# Patient Record
Sex: Female | Born: 1945 | Race: White | Hispanic: No | Smoking: Never smoker
Health system: Southern US, Community
[De-identification: ages and names within clinical notes are randomized; demographics above are authoritative.]

## PROBLEM LIST (undated history)

## (undated) DIAGNOSIS — K760 Fatty (change of) liver, not elsewhere classified: Secondary | ICD-10-CM

## (undated) DIAGNOSIS — M1990 Unspecified osteoarthritis, unspecified site: Secondary | ICD-10-CM

## (undated) DIAGNOSIS — G971 Other reaction to spinal and lumbar puncture: Secondary | ICD-10-CM

## (undated) DIAGNOSIS — E785 Hyperlipidemia, unspecified: Secondary | ICD-10-CM

## (undated) DIAGNOSIS — I10 Essential (primary) hypertension: Secondary | ICD-10-CM

## (undated) DIAGNOSIS — H3530 Unspecified macular degeneration: Secondary | ICD-10-CM

## (undated) DIAGNOSIS — G4730 Sleep apnea, unspecified: Secondary | ICD-10-CM

## (undated) DIAGNOSIS — E669 Obesity, unspecified: Secondary | ICD-10-CM

## (undated) DIAGNOSIS — R748 Abnormal levels of other serum enzymes: Secondary | ICD-10-CM

## (undated) DIAGNOSIS — E119 Type 2 diabetes mellitus without complications: Secondary | ICD-10-CM

## (undated) HISTORY — DX: Fatty (change of) liver, not elsewhere classified: K76.0

## (undated) HISTORY — PX: DIAGNOSTIC LAPAROSCOPY: SUR761

## (undated) HISTORY — PX: TUBAL LIGATION: SHX77

## (undated) HISTORY — DX: Abnormal levels of other serum enzymes: R74.8

## (undated) HISTORY — DX: Hyperlipidemia, unspecified: E78.5

## (undated) HISTORY — PX: APPENDECTOMY: SHX54

## (undated) HISTORY — DX: Sleep apnea, unspecified: G47.30

## (undated) HISTORY — DX: Unspecified osteoarthritis, unspecified site: M19.90

## (undated) HISTORY — PX: SPINE SURGERY: SHX786

## (undated) HISTORY — DX: Essential (primary) hypertension: I10

## (undated) HISTORY — PX: ABDOMINAL HYSTERECTOMY: SHX81

## (undated) HISTORY — DX: Type 2 diabetes mellitus without complications: E11.9

## (undated) HISTORY — PX: CATARACT EXTRACTION: SUR2

## (undated) HISTORY — DX: Obesity, unspecified: E66.9

---

## 1985-07-21 HISTORY — PX: CERVICAL LAMINECTOMY: SHX94

## 2007-05-24 ENCOUNTER — Ambulatory Visit: Payer: Self-pay | Admitting: Family Medicine

## 2007-06-02 ENCOUNTER — Ambulatory Visit: Payer: Self-pay | Admitting: Family Medicine

## 2007-10-31 ENCOUNTER — Ambulatory Visit: Payer: Self-pay | Admitting: Family Medicine

## 2008-04-18 ENCOUNTER — Ambulatory Visit: Payer: Self-pay | Admitting: Family Medicine

## 2009-07-10 ENCOUNTER — Ambulatory Visit: Payer: Self-pay | Admitting: Family Medicine

## 2009-07-17 ENCOUNTER — Ambulatory Visit: Payer: Self-pay | Admitting: Family Medicine

## 2010-01-07 ENCOUNTER — Ambulatory Visit: Payer: Self-pay | Admitting: Gastroenterology

## 2010-03-17 ENCOUNTER — Ambulatory Visit: Payer: Self-pay | Admitting: Internal Medicine

## 2010-04-22 ENCOUNTER — Ambulatory Visit: Payer: Self-pay | Admitting: Otolaryngology

## 2012-05-12 ENCOUNTER — Ambulatory Visit: Payer: Self-pay | Admitting: Ophthalmology

## 2012-05-28 ENCOUNTER — Emergency Department: Payer: Self-pay | Admitting: *Deleted

## 2012-07-21 HISTORY — PX: COLONOSCOPY: SHX174

## 2013-04-18 ENCOUNTER — Ambulatory Visit: Payer: Self-pay | Admitting: Family Medicine

## 2013-04-25 ENCOUNTER — Ambulatory Visit: Payer: Self-pay | Admitting: Family Medicine

## 2013-05-06 ENCOUNTER — Ambulatory Visit: Payer: Self-pay | Admitting: Gastroenterology

## 2013-05-23 ENCOUNTER — Ambulatory Visit: Payer: Self-pay | Admitting: Family Medicine

## 2013-05-27 ENCOUNTER — Ambulatory Visit: Payer: Self-pay | Admitting: Gastroenterology

## 2013-06-20 ENCOUNTER — Ambulatory Visit: Payer: Self-pay | Admitting: Family Medicine

## 2013-07-21 ENCOUNTER — Ambulatory Visit: Payer: Self-pay | Admitting: Family Medicine

## 2013-08-02 ENCOUNTER — Ambulatory Visit: Payer: Self-pay | Admitting: Family Medicine

## 2013-08-02 LAB — RAPID STREP-A WITH REFLX: MICRO TEXT REPORT: NEGATIVE

## 2013-08-05 LAB — BETA STREP CULTURE(ARMC)

## 2014-06-25 ENCOUNTER — Emergency Department: Payer: Self-pay | Admitting: Emergency Medicine

## 2015-02-15 DIAGNOSIS — E1149 Type 2 diabetes mellitus with other diabetic neurological complication: Secondary | ICD-10-CM | POA: Insufficient documentation

## 2015-02-15 DIAGNOSIS — E785 Hyperlipidemia, unspecified: Secondary | ICD-10-CM | POA: Insufficient documentation

## 2015-02-26 ENCOUNTER — Ambulatory Visit (INDEPENDENT_AMBULATORY_CARE_PROVIDER_SITE_OTHER): Payer: Medicare Other | Admitting: Family Medicine

## 2015-02-26 ENCOUNTER — Encounter: Payer: Self-pay | Admitting: Family Medicine

## 2015-02-26 ENCOUNTER — Other Ambulatory Visit: Payer: Self-pay | Admitting: Family Medicine

## 2015-02-26 VITALS — BP 123/78 | HR 74 | Temp 98.6°F | Ht 63.3 in | Wt 208.0 lb

## 2015-02-26 DIAGNOSIS — E119 Type 2 diabetes mellitus without complications: Secondary | ICD-10-CM

## 2015-02-26 DIAGNOSIS — R079 Chest pain, unspecified: Secondary | ICD-10-CM | POA: Diagnosis not present

## 2015-02-26 DIAGNOSIS — Z1239 Encounter for other screening for malignant neoplasm of breast: Secondary | ICD-10-CM

## 2015-02-26 DIAGNOSIS — E785 Hyperlipidemia, unspecified: Secondary | ICD-10-CM | POA: Diagnosis not present

## 2015-02-26 LAB — BAYER DCA HB A1C WAIVED: HB A1C (BAYER DCA - WAIVED): 7.3 % — ABNORMAL HIGH (ref <7.0)

## 2015-02-26 NOTE — Assessment & Plan Note (Signed)
Discussed not at goal for diabetes Patient will do better with medication compliance and weight loss for gaining control.

## 2015-02-26 NOTE — Progress Notes (Signed)
   BP 123/78 mmHg  Pulse 74  Temp(Src) 98.6 F (37 C)  Ht 5' 3.3" (1.608 m)  Wt 208 lb (94.348 kg)  BMI 36.49 kg/m2  SpO2 98%   Subjective:    Patient ID: Alyssa Mayo, female    DOB: Dec 31, 1945, 69 y.o.   MRN: [id number]  HPI: Alyssa Mayo is a 69 y.o. female  Chief Complaint  Patient presents with  . Diabetes   Patient for follow-up diabetes has been doing well blood sugars all in all K takes her medication most every day though occasionally will miss Invokanna and morning metformin Patient states about concerning with weight loss whenever she carries her 35 pound grandson when sleeping from the car to the house gets completely winded with some occasional right chest area pain. No nausea and no diaphoresis Resolves after a few minutes but comes on consistently with heavy exertion.  Relevant past medical, surgical, family and social history reviewed and updated as indicated. Interim medical history since our last visit reviewed. Allergies and medications reviewed and updated.  Review of Systems  Constitutional: Negative.   Respiratory: Positive for shortness of breath.   Cardiovascular: Positive for chest pain. Negative for palpitations and leg swelling.  Gastrointestinal: Negative for abdominal distention.    Per HPI unless specifically indicated above     Objective:    BP 123/78 mmHg  Pulse 74  Temp(Src) 98.6 F (37 C)  Ht 5' 3.3" (1.608 m)  Wt 208 lb (94.348 kg)  BMI 36.49 kg/m2  SpO2 98%  Wt Readings from Last 3 Encounters:  02/26/15 208 lb (94.348 kg)  11/20/14 206 lb (93.441 kg)    Physical Exam  Constitutional: She appears well-developed and well-nourished.  Cardiovascular: Normal rate and regular rhythm.   Pulmonary/Chest: Effort normal and breath sounds normal.    Results for orders placed or performed in visit on 02/26/15  Bayer DCA Hb A1c Waived  Result Value Ref Range   Bayer DCA Hb A1c Waived 7.3 (H) <7.0 %      Assessment & Plan:    Problem List Items Addressed This Visit      Endocrine   Diabetes mellitus without complication - Primary    Discussed not at goal for diabetes Patient will do better with medication compliance and weight loss for gaining control.      Relevant Orders   Bayer DCA Hb A1c Waived (Completed)   Ambulatory referral to Cardiology   Bayer [facility] Hb A1c Waived   Basic metabolic panel     Other   Hyperlipidemia    The current medical regimen is effective;  continue present plan and medications.       Relevant Orders   Ambulatory referral to Cardiology   [facility], [place]   Basic metabolic panel    Other Visit Diagnoses    Screening for breast cancer        Relevant Orders    MM DIGITAL SCREENING BILATERAL    Chest pain, unspecified chest pain type        Because of patient's symptoms and risk factors will refer to cardiology to further evaluate    Relevant Orders    EKG 12-Lead (Completed)    Ambulatory referral to Cardiology        Follow up plan: Return in about 3 months (around 05/29/2015), or if symptoms worsen or fail to improve.

## 2015-02-26 NOTE — Assessment & Plan Note (Signed)
The current medical regimen is effective;  continue present plan and medications.  

## 2015-03-29 ENCOUNTER — Telehealth: Payer: Self-pay

## 2015-03-29 NOTE — Telephone Encounter (Signed)
Refill request for Celecoxib 200mg  1 capsule daily  [facility]

## 2015-03-30 MED ORDER — CELECOXIB 200 MG PO CAPS: 200.0000 mg | ORAL_CAPSULE | Freq: Every day | ORAL | Status: DC

## 2015-05-17 ENCOUNTER — Encounter: Payer: Self-pay | Admitting: Family Medicine

## 2015-05-17 ENCOUNTER — Ambulatory Visit (INDEPENDENT_AMBULATORY_CARE_PROVIDER_SITE_OTHER): Payer: Medicare Other | Admitting: Family Medicine

## 2015-05-17 VITALS — BP 138/66 | HR 94 | Temp 98.5°F | Ht 63.1 in | Wt 211.2 lb

## 2015-05-17 DIAGNOSIS — J329 Chronic sinusitis, unspecified: Secondary | ICD-10-CM | POA: Insufficient documentation

## 2015-05-17 DIAGNOSIS — E119 Type 2 diabetes mellitus without complications: Secondary | ICD-10-CM | POA: Diagnosis not present

## 2015-05-17 DIAGNOSIS — J0100 Acute maxillary sinusitis, unspecified: Secondary | ICD-10-CM | POA: Diagnosis not present

## 2015-05-17 MED ORDER — AMOXICILLIN 875 MG PO TABS: 875.0000 mg | ORAL_TABLET | Freq: Two times a day (BID) | ORAL | Status: DC

## 2015-05-17 NOTE — Progress Notes (Signed)
BP 138/66 mmHg  Pulse 94  Temp(Src) 98.5 F (36.9 C)  Ht 5' 3.1" (1.603 m)  Wt 211 lb 3.2 oz (95.8 kg)  BMI 37.28 kg/m2  SpO2 95%   Subjective:    Patient ID: Alyssa Mayo, female    DOB: [birth year], 69 y.o.   MRN: [id number]  HPI: Alyssa Mayo is a 69 y.o. female  Chief Complaint  Patient presents with  . URI   symptoms been ongoing for more than a week with a lot of head pressure especially with bending runny nose congestion and cough later this week is gotten more into her chest with more coughing. Has been running some low-grade fever and generalized achiness No nausea vomiting Been taking Alka-Seltzer plus with some relief.  Relevant past medical, surgical, family and social history reviewed and updated as indicated. Interim medical history since our last visit reviewed. Allergies and medications reviewed and updated.  Review of Systems  Constitutional: Positive for fever, chills and fatigue.  HENT: Positive for congestion, ear pain, postnasal drip, rhinorrhea, sinus pressure, sneezing and sore throat.   Eyes: Negative.   Respiratory: Positive for cough.   Cardiovascular: Negative.   Gastrointestinal: Negative.     Per HPI unless specifically indicated above     Objective:    BP 138/66 mmHg  Pulse 94  Temp(Src) 98.5 F (36.9 C)  Ht 5' 3.1" (1.603 m)  Wt 211 lb 3.2 oz (95.8 kg)  BMI 37.28 kg/m2  SpO2 95%  Wt Readings from Last 3 Encounters:  05/17/15 211 lb 3.2 oz (95.8 kg)  02/26/15 208 lb (94.348 kg)  11/20/14 206 lb (93.441 kg)    Physical Exam  Constitutional: She is oriented to person, place, and time. She appears well-developed and well-nourished. No distress.  HENT:  Head: Normocephalic and atraumatic.  Right Ear: Hearing and external ear normal.  Left Ear: Hearing and external ear normal.  Nose: Nose normal.  Eyes: Conjunctivae and lids are normal. Right eye exhibits no discharge. Left eye exhibits no discharge. No scleral icterus.  Neck:  Normal range of motion.  Cardiovascular: Normal rate, regular rhythm and normal heart sounds.   Pulmonary/Chest: Effort normal. No respiratory distress.  rhonchi  Musculoskeletal: Normal range of motion.  Lymphadenopathy:    She has no cervical adenopathy.  Neurological: She is alert and oriented to person, place, and time.  Skin: Skin is intact. No rash noted.  Psychiatric: She has a normal mood and affect. Her speech is normal and behavior is normal. Judgment and thought content normal. Cognition and memory are normal.    Results for orders placed or performed in visit on 02/26/15  Bayer DCA Hb A1c Waived  Result Value Ref Range   Bayer DCA Hb A1c Waived 7.3 (H) <7.0 %      Assessment & Plan:   Problem List Items Addressed This Visit      Respiratory   Sinusitis - Primary    Discuss use of over-the-counter medications continuing Alka-Seltzer plus and or Tylenol Will start amoxicillin Discuss over self-care techniques fluids etc.      Relevant Medications   amoxicillin (AMOXIL) 875 MG tablet     Endocrine   Diabetes mellitus without complication (HCC)    Discuss elevated glucose with being sick patient will take extra precautions with diet is unable to do exercise because of feeling bad.          Follow up plan: Return if symptoms worsen or fail to improve, for  As scheduled.

## 2015-05-17 NOTE — Assessment & Plan Note (Signed)
Discuss elevated glucose with being sick patient will take extra precautions with diet is unable to do exercise because of feeling bad.

## 2015-05-17 NOTE — Assessment & Plan Note (Signed)
Discuss use of over-the-counter medications continuing Alka-Seltzer plus and or Tylenol Will start amoxicillin Discuss over self-care techniques fluids etc.

## 2015-06-04 ENCOUNTER — Encounter: Payer: Self-pay | Admitting: Family Medicine

## 2015-06-04 ENCOUNTER — Ambulatory Visit (INDEPENDENT_AMBULATORY_CARE_PROVIDER_SITE_OTHER): Payer: Medicare Other | Admitting: Family Medicine

## 2015-06-04 VITALS — BP 136/80 | HR 91 | Temp 98.2°F | Ht 62.5 in | Wt 209.0 lb

## 2015-06-04 DIAGNOSIS — E785 Hyperlipidemia, unspecified: Secondary | ICD-10-CM

## 2015-06-04 DIAGNOSIS — Z23 Encounter for immunization: Secondary | ICD-10-CM

## 2015-06-04 DIAGNOSIS — E119 Type 2 diabetes mellitus without complications: Secondary | ICD-10-CM

## 2015-06-04 LAB — LP+ALT+AST+GLU PICCOLO, WAIVED
ALT (SGPT) Piccolo, Waived: 30 U/L (ref 10–47)
AST (SGOT) Piccolo, Waived: 33 U/L (ref 11–38)
CHOL/HDL RATIO PICCOLO,WAIVE: 3.4 mg/dL
Cholesterol Piccolo, Waived: 155 mg/dL (ref <200)
Glucose Piccolo, Waived: 197 mg/dL — ABNORMAL HIGH (ref 73–118)
HDL Chol Piccolo, Waived: 46 mg/dL — ABNORMAL LOW (ref >59)
LDL CHOL CALC PICCOLO WAIVED: 42 mg/dL (ref <100)
TRIGLYCERIDES PICCOLO,WAIVED: 332 mg/dL — AB (ref <150)
VLDL Chol Calc Piccolo,Waive: 66 mg/dL — ABNORMAL HIGH (ref <30)

## 2015-06-04 LAB — BAYER DCA HB A1C WAIVED: HB A1C: 7.5 % — AB (ref <7.0)

## 2015-06-04 LAB — MICROALBUMIN, URINE WAIVED
Creatinine, Urine Waived: 100 mg/dL (ref 10–300)
Microalb, Ur Waived: 30 mg/L — ABNORMAL HIGH (ref 0–19)
Microalb/Creat Ratio: <30 mg/g (ref <30)

## 2015-06-04 MED ORDER — BENAZEPRIL HCL 20 MG PO TABS: 20.0000 mg | ORAL_TABLET | Freq: Every day | ORAL | Status: DC

## 2015-06-04 NOTE — Assessment & Plan Note (Signed)
The current medical regimen is effective;  continue present plan and medications.  

## 2015-06-04 NOTE — Assessment & Plan Note (Addendum)
The current medical regimen is effective;  continue present plan and medications. With diabetes and renal protection borderline high blood pressure at times will start benazepril 20 mg 1 a day

## 2015-06-04 NOTE — Progress Notes (Signed)
BP 136/80 mmHg  Pulse 91  Temp(Src) 98.2 F (36.8 C)  Ht 5' 2.5" (1.588 m)  Wt 209 lb (94.802 kg)  BMI 37.59 kg/m2  SpO2 93%   Subjective:    Patient ID: Alyssa Mayo, female    DOB: 02/13/1946, 69 y.o.   MRN: [id number]  HPI: Alyssa Mayo is a 69 y.o. female  Chief Complaint  Patient presents with  . Diabetes  . Hypertension  . Hyperlipidemia   doing well with diabetes medications was taken Invokanna alternating days and money saving attempt for a while and stopped due to blood sugar going up. Otherwise blood sugars been doing well Taking simvastatin without problems or side effects Celebrex doing well with controlling arthritis somewhat Reflux is being controlled well with Protonix   Relevant past medical, surgical, family and social history reviewed and updated as indicated. Interim medical history since our last visit reviewed. Allergies and medications reviewed and updated.  Review of Systems  Constitutional: Negative.   Respiratory: Negative.   Cardiovascular: Negative.     Per HPI unless specifically indicated above     Objective:    BP 136/80 mmHg  Pulse 91  Temp(Src) 98.2 F (36.8 C)  Ht 5' 2.5" (1.588 m)  Wt 209 lb (94.802 kg)  BMI 37.59 kg/m2  SpO2 93%  Wt Readings from Last 3 Encounters:  06/04/15 209 lb (94.802 kg)  05/17/15 211 lb 3.2 oz (95.8 kg)  02/26/15 208 lb (94.348 kg)    Physical Exam  Constitutional: She is oriented to person, place, and time. She appears well-developed and well-nourished. No distress.  HENT:  Head: Normocephalic and atraumatic.  Right Ear: Hearing normal.  Left Ear: Hearing normal.  Nose: Nose normal.  Eyes: Conjunctivae and lids are normal. Right eye exhibits no discharge. Left eye exhibits no discharge. No scleral icterus.  Cardiovascular: Normal rate, regular rhythm and normal heart sounds.   Pulmonary/Chest: Effort normal and breath sounds normal. No respiratory distress.  Musculoskeletal: Normal range of  motion.  Neurological: She is alert and oriented to person, place, and time.  Skin: Skin is intact. No rash noted.  Psychiatric: She has a normal mood and affect. Her speech is normal and behavior is normal. Judgment and thought content normal. Cognition and memory are normal.    Results for orders placed or performed in visit on 02/26/15  Bayer DCA Hb A1c Waived  Result Value Ref Range   Bayer DCA Hb A1c Waived 7.3 (H) <7.0 %      Assessment & Plan:   Problem List Items Addressed This Visit      Endocrine   Diabetes mellitus without complication (HCC)    The current medical regimen is effective;  continue present plan and medications. With diabetes and renal protection borderline high blood pressure at times will start benazepril 20 mg 1 a day       Relevant Medications   benazepril (LOTENSIN) 20 MG tablet   Other Relevant Orders   Microalbumin, Urine Waived     Other   Hyperlipidemia    The current medical regimen is effective;  continue present plan and medications.       Relevant Medications   benazepril (LOTENSIN) 20 MG tablet    Other Visit Diagnoses    Immunization due    -  Primary    Relevant Orders    Flu Vaccine QUAD 36+ mos PF IM (Fluarix & Fluzone Quad PF) (Completed)  Follow up plan: Return in about 3 months (around 09/04/2015) for A1c and BMP with new medication.

## 2015-06-05 ENCOUNTER — Encounter: Payer: Self-pay | Admitting: Family Medicine

## 2015-06-05 LAB — BASIC METABOLIC PANEL
BUN / CREAT RATIO: 25 (ref 11–26)
BUN: 21 mg/dL (ref 8–27)
CHLORIDE: 102 mmol/L (ref 97–106)
CO2: 21 mmol/L (ref 18–29)
Calcium: 9.5 mg/dL (ref 8.7–10.3)
Creatinine, Ser: 0.84 mg/dL (ref 0.57–1.00)
GFR calc Af Amer: 81 mL/min/{1.73_m2} (ref >59)
GFR calc non Af Amer: 71 mL/min/{1.73_m2} (ref >59)
GLUCOSE: 199 mg/dL — AB (ref 65–99)
Potassium: 4.4 mmol/L (ref 3.5–5.2)
Sodium: 141 mmol/L (ref 136–144)

## 2015-07-01 ENCOUNTER — Ambulatory Visit
Admission: EM | Admit: 2015-07-01 | Discharge: 2015-07-01 | Disposition: A | Discharge status: Home / Self Care | Payer: Medicare Other | Attending: Family Medicine | Admitting: Family Medicine

## 2015-07-01 ENCOUNTER — Ambulatory Visit (INDEPENDENT_AMBULATORY_CARE_PROVIDER_SITE_OTHER): Payer: Medicare Other

## 2015-07-01 ENCOUNTER — Encounter: Payer: Self-pay | Admitting: Emergency Medicine

## 2015-07-01 DIAGNOSIS — E785 Hyperlipidemia, unspecified: Secondary | ICD-10-CM | POA: Insufficient documentation

## 2015-07-01 DIAGNOSIS — E669 Obesity, unspecified: Secondary | ICD-10-CM | POA: Diagnosis not present

## 2015-07-01 DIAGNOSIS — K5901 Slow transit constipation: Secondary | ICD-10-CM | POA: Diagnosis not present

## 2015-07-01 DIAGNOSIS — R1013 Epigastric pain: Secondary | ICD-10-CM | POA: Diagnosis not present

## 2015-07-01 DIAGNOSIS — R9431 Abnormal electrocardiogram [ECG] [EKG]: Secondary | ICD-10-CM | POA: Diagnosis not present

## 2015-07-01 DIAGNOSIS — Z7984 Long term (current) use of oral hypoglycemic drugs: Secondary | ICD-10-CM | POA: Diagnosis not present

## 2015-07-01 DIAGNOSIS — Z79899 Other long term (current) drug therapy: Secondary | ICD-10-CM | POA: Diagnosis not present

## 2015-07-01 DIAGNOSIS — R109 Unspecified abdominal pain: Secondary | ICD-10-CM | POA: Insufficient documentation

## 2015-07-01 DIAGNOSIS — G4730 Sleep apnea, unspecified: Secondary | ICD-10-CM | POA: Insufficient documentation

## 2015-07-01 DIAGNOSIS — K5900 Constipation, unspecified: Secondary | ICD-10-CM | POA: Diagnosis not present

## 2015-07-01 DIAGNOSIS — E119 Type 2 diabetes mellitus without complications: Secondary | ICD-10-CM | POA: Insufficient documentation

## 2015-07-01 LAB — CBC WITH DIFFERENTIAL/PLATELET
Basophils Absolute: 0.1 10*3/uL (ref 0–0.1)
Basophils Relative: 1 %
EOS ABS: 0.2 10*3/uL (ref 0–0.7)
EOS PCT: 2 %
HCT: 39.3 % (ref 35.0–47.0)
HEMOGLOBIN: 13.1 g/dL (ref 12.0–16.0)
LYMPHS ABS: 2.3 10*3/uL (ref 1.0–3.6)
LYMPHS PCT: 22 %
MCH: 28.7 pg (ref 26.0–34.0)
MCHC: 33.4 g/dL (ref 32.0–36.0)
MCV: 86 fL (ref 80.0–100.0)
MONOS PCT: 6 %
Monocytes Absolute: 0.6 10*3/uL (ref 0.2–0.9)
Neutro Abs: 7.3 10*3/uL — ABNORMAL HIGH (ref 1.4–6.5)
Neutrophils Relative %: 69 %
PLATELETS: 266 10*3/uL (ref 150–440)
RBC: 4.57 MIL/uL (ref 3.80–5.20)
RDW: 14.8 % — ABNORMAL HIGH (ref 11.5–14.5)
WBC: 10.5 10*3/uL (ref 3.6–11.0)

## 2015-07-01 LAB — COMPREHENSIVE METABOLIC PANEL
ALBUMIN: 4.1 g/dL (ref 3.5–5.0)
ALT: 37 U/L (ref 14–54)
AST: 39 U/L (ref 15–41)
Alkaline Phosphatase: 127 U/L — ABNORMAL HIGH (ref 38–126)
Anion gap: 10 (ref 5–15)
BILIRUBIN TOTAL: 0.3 mg/dL (ref 0.3–1.2)
BUN: 25 mg/dL — AB (ref 6–20)
CO2: 24 mmol/L (ref 22–32)
CREATININE: 0.87 mg/dL (ref 0.44–1.00)
Calcium: 9.7 mg/dL (ref 8.9–10.3)
Chloride: 105 mmol/L (ref 101–111)
GFR calc Af Amer: >60 mL/min (ref >60)
GLUCOSE: 145 mg/dL — AB (ref 65–99)
POTASSIUM: 4.3 mmol/L (ref 3.5–5.1)
Sodium: 139 mmol/L (ref 135–145)
TOTAL PROTEIN: 7.2 g/dL (ref 6.5–8.1)

## 2015-07-01 LAB — LIPASE, BLOOD: Lipase: 35 U/L (ref 11–51)

## 2015-07-01 LAB — AMYLASE: AMYLASE: 76 U/L (ref 28–100)

## 2015-07-01 MED ORDER — GI COCKTAIL ~~LOC~~
30.0000 mL | Freq: Once | ORAL | Status: AC
  Administered 2015-07-01: 30 mL via ORAL

## 2015-07-01 NOTE — Discharge Instructions (Signed)
Abdominal Pain, Adult °Many things can cause belly (abdominal) pain. Most times, the belly pain is not dangerous. Many cases of belly pain can be watched and treated at home. °HOME CARE  °· Do not take medicines that help you go poop (laxatives) unless told to by your doctor. °· Only take medicine as told by your doctor. °· Eat or drink as told by your doctor. Your doctor will tell you if you should be on a special diet. °GET HELP IF: °· You do not know what is causing your belly pain. °· You have belly pain while you are sick to your stomach (nauseous) or have runny poop (diarrhea). °· You have pain while you pee or poop. °· Your belly pain wakes you up at night. °· You have belly pain that gets worse or better when you eat. °· You have belly pain that gets worse when you eat fatty foods. °· You have a fever. °GET HELP RIGHT AWAY IF:  °· The pain does not go away within 2 hours. °· You keep throwing up (vomiting). °· The pain changes and is only in the right or left part of the belly. °· You have bloody or tarry looking poop. °MAKE SURE YOU:  °· Understand these instructions. °· Will watch your condition. °· Will get help right away if you are not doing well or get worse. °  °This information is not intended to replace advice given to you by your health care provider. Make sure you discuss any questions you have with your health care provider. °  °Document Released: 12/24/2007 Document Revised: 07/28/2014 Document Reviewed: 03/16/2013 °Elsevier Interactive Patient Education ©2016 [employer] ° °Constipation, Adult °Constipation is when a person: °· Poops (has a bowel movement) less than 3 times a week. °· Has a hard time pooping. °· Has poop that is dry, hard, or bigger than normal. °HOME CARE  °· Eat foods with a lot of fiber in them. This includes fruits, vegetables, beans, and whole grains such as brown rice. °· Avoid fatty foods and foods with a lot of sugar. This includes french fries, hamburgers, cookies,  candy, and soda. °· If you are not getting enough fiber from food, take products with added fiber in them (supplements). °· Drink enough fluid to keep your pee (urine) clear or pale yellow. °· Exercise on a regular basis, or as told by your doctor. °· Go to the restroom when you feel like you need to poop. Do not hold it. °· Only take medicine as told by your doctor. Do not take medicines that help you poop (laxatives) without talking to your doctor first. °GET HELP RIGHT AWAY IF:  °· You have bright red blood in your poop (stool). °· Your constipation lasts more than 4 days or gets worse. °· You have belly (abdominal) or butt (rectal) pain. °· You have thin poop (as thin as a pencil). °· You lose weight, and it cannot be explained. °MAKE SURE YOU:  °· Understand these instructions. °· Will watch your condition. °· Will get help right away if you are not doing well or get worse. °  °This information is not intended to replace advice given to you by your health care provider. Make sure you discuss any questions you have with your health care provider. °  °Document Released: 12/24/2007 Document Revised: 07/28/2014 Document Reviewed: 04/18/2013 °Elsevier Interactive Patient Education ©2016 [employer] ° °

## 2015-07-01 NOTE — ED Provider Notes (Signed)
CSN: [id number]     Arrival date & time 07/01/15  1544 History   First MD Initiated Contact with Patient 07/01/15 1611    Nurses notes were reviewed. Chief Complaint  Patient presents with  . Abdominal Pain   Patient reports having abdominal discomfort since Friday. She states that when she overeats she will have discomfort in the upper abdomen. This is the usual discomfort that she'll have an she did overeat Friday night at a banquet. She states that she over ate and she states that she ate 3 times the normal amount that she would eat. She did have a slight bowel movement yesterday and has had at least 2 today but still is having the abdominal discomfort. She states that she has a history of vague abdominal pain on the right side she's had that ultrasound x-rayed and evaluated before in the past and the nausea was the cause of that. She has been evaluated and is been diagnosed with a hiatal hernia. She has had upper endoscopy for that and has been placed on a PPI. She states the PPI stops the acid from coming back into her throat. Because the discomfort has continued she came in to be seen and evaluated at the urgent care.   She has multiple medical problems elevated liver enzymes diabetes sleep apnea and obesity. She does not smoke.  (Consider location/radiation/quality/duration/timing/severity/associated sxs/prior Treatment) Patient is a 69 y.o. female presenting with abdominal pain. The history is provided by the patient. No language interpreter was used.  Abdominal Pain Pain location:  Epigastric Pain quality: aching, bloating, cramping and fullness   Pain radiates to:  Does not radiate Pain severity:  Moderate Timing:  Constant Progression:  Unchanged Context: not alcohol use, not awakening from sleep, not diet changes, not laxative use, not medication withdrawal, not previous surgeries, not recent illness, not recent travel, not retching, not sick contacts and not suspicious food intake    Relieved by:  Nothing Ineffective treatments:  Lying down, bowel activity and belching Associated symptoms: belching   Associated symptoms: no chest pain, no constipation, no dysuria, no melena, no nausea and no vomiting   Risk factors: being elderly and obesity     Past Medical History  Diagnosis Date  . Fatty liver   . Sleep apnea   . Arthritis   . Elevated liver enzymes   . Diabetes mellitus without complication (HCC)   . Lifelong obesity   . Hyperlipidemia    Past Surgical History  Procedure Laterality Date  . Cervical laminectomy  1987  . Appendectomy    . Cesarean section    . Abdominal hysterectomy    . Tubal ligation    . Colonoscopy  diverticulosis  . Spine surgery     Family History  Problem Relation Age of Onset  . Cancer Mother   . Heart disease Mother   . Stroke Mother   . Cancer Father   . Stroke Father   . Cancer Brother    Social History  Substance Use Topics  . Smoking status: Never Smoker   . Smokeless tobacco: Never Used  . Alcohol Use: 0.0 oz/week    0 Standard drinks or equivalent per week     Comment: rare   OB History    No data available     Review of Systems  Cardiovascular: Negative for chest pain.  Gastrointestinal: Positive for abdominal pain. Negative for nausea, vomiting, constipation and melena.  Genitourinary: Negative for dysuria.  All other systems reviewed  and are negative.   Allergies  Sulfa antibiotics  Home Medications   Prior to Admission medications   Medication Sig Start Date End Date Taking? Authorizing Provider  benazepril (LOTENSIN) 20 MG tablet Take 1 tablet (20 mg total) by mouth daily. 06/04/15  Yes Steele Sizer, MD  canagliflozin (INVOKANA) 300 MG TABS tablet Take 300 mg by mouth daily before breakfast.   Yes Historical Provider, MD  celecoxib (CELEBREX) 200 MG capsule Take 1 capsule (200 mg total) by mouth daily. 03/30/15  Yes Gabriel Cirri, NP  metFORMIN (GLUCOPHAGE) 500 MG tablet TAKE (2) TABLETS  TWICE DAILY. 02/26/15  Yes Steele Sizer, MD  pantoprazole (PROTONIX) 40 MG tablet Take 40 mg by mouth.   Yes Historical Provider, MD  simvastatin (ZOCOR) 40 MG tablet Take 40 mg by mouth daily.   Yes Historical Provider, MD   Meds Ordered and Administered this Visit   Medications  gi cocktail (Maalox,Lidocaine,Donnatal) (30 mLs Oral Given 07/01/15 1641)    BP 131/55 mmHg  Pulse 95  Temp(Src) 98.6 F (37 C) (Tympanic)  Resp 18  Ht  (1.6 m)  Wt 210 lb (95.255 kg)  BMI 37.21 kg/m2  SpO2 97% No data found.   Physical Exam  Constitutional: She is oriented to person, place, and time. She appears well-developed and well-nourished.  HENT:  Head: Normocephalic.  Eyes: Conjunctivae are normal. Pupils are equal, round, and reactive to light.  Neck: Normal range of motion. Neck supple.  Cardiovascular: Normal rate, regular rhythm, S1 normal, S2 normal and normal heart sounds.   Bowel sounds abdominal sounds and actually heard in the chest area. Over on the left side of the chest  Pulmonary/Chest: Effort normal and breath sounds normal. No respiratory distress. She has no wheezes.  Abdominal: Soft. Bowel sounds are normal. She exhibits distension. There is no hepatosplenomegaly. There is tenderness. There is no CVA tenderness. Hernia confirmed negative in the ventral area, confirmed negative in the right inguinal area and confirmed negative in the left inguinal area.  Patient has mild mid epigastric tenderness but also bowel sounds could be heard up in the left chest area as well bowel sounds abdomen were hyperactive also.  Musculoskeletal: Normal range of motion.  Neurological: She is alert and oriented to person, place, and time.  Skin: Skin is warm and dry. No rash noted. No erythema.  Psychiatric: She has a normal mood and affect. Her behavior is normal.  Vitals reviewed.   ED Course  Procedures (including critical care time)  Labs Review Labs Reviewed  CBC WITH  DIFFERENTIAL/PLATELET - Abnormal; Notable for the following:    RDW 14.8 (*)    Neutro Abs 7.3 (*)    All other components within normal limits  COMPREHENSIVE METABOLIC PANEL - Abnormal; Notable for the following:    Glucose, Bld 145 (*)    BUN 25 (*)    Alkaline Phosphatase 127 (*)    All other components within normal limits  AMYLASE  LIPASE, BLOOD    Imaging Review Dg Abd Acute W/chest  07/01/2015  CLINICAL DATA:  Chest tightness for 3 days EXAM: DG ABDOMEN ACUTE W/ 1V CHEST COMPARISON:  None. FINDINGS: Cardiac shadow is within normal limits. The lungs are clear bilaterally. Scattered large and small bowel gas is noted. Fecal material is noted throughout the colon. No free air is seen. Degenerative changes of the lumbar spine and thoracic spine are noted. No focal mass lesion is seen. IMPRESSION: Fecal material throughout the colon indicating  a degree of mild constipation. No other focal abnormality is seen. Electronically Signed   By: Alcide Clever M.D.   On: 07/01/2015 17:44     Visual Acuity Review  Right Eye Distance:   Left Eye Distance:   Bilateral Distance:    Right Eye Near:   Left Eye Near:    Bilateral Near:     ED ECG REPORT   Date: 07/01/2015  EKG Time: 6:04 PM  Rate: 89  Rhythm: normal sinus rhythm,  unchanged from previous tracings, normal sinus rhythm, Q waves in III and AVF  Axis: 46  Intervals:none  ST&T Change: none  Narrative Interpretation: Abnormal EKG. Possible old inferior infarct but with no change from EKG done in August appears to be old.        Results for orders placed or performed during the hospital encounter of 07/01/15  CBC with Differential  Result Value Ref Range   WBC 10.5 3.6 - 11.0 K/uL   RBC 4.57 3.80 - 5.20 MIL/uL   Hemoglobin 13.1 12.0 - 16.0 g/dL   HCT 16.1 09.6 - 04.5 %   MCV 86.0 80.0 - 100.0 fL   MCH 28.7 26.0 - 34.0 pg   MCHC 33.4 32.0 - 36.0 g/dL   RDW 40.9 (H) 81.1 - 91.4 %   Platelets 266 150 - 440 K/uL    Neutrophils Relative % 69 %   Neutro Abs 7.3 (H) 1.4 - 6.5 K/uL   Lymphocytes Relative 22 %   Lymphs Abs 2.3 1.0 - 3.6 K/uL   Monocytes Relative 6 %   Monocytes Absolute 0.6 0.2 - 0.9 K/uL   Eosinophils Relative 2 %   Eosinophils Absolute 0.2 0 - 0.7 K/uL   Basophils Relative 1 %   Basophils Absolute 0.1 0 - 0.1 K/uL  Amylase  Result Value Ref Range   Amylase 76 28 - 100 U/L  Lipase, blood  Result Value Ref Range   Lipase 35 11 - 51 U/L  Comprehensive metabolic panel  Result Value Ref Range   Sodium 139 135 - 145 mmol/L   Potassium 4.3 3.5 - 5.1 mmol/L   Chloride 105 101 - 111 mmol/L   CO2 24 22 - 32 mmol/L   Glucose, Bld 145 (H) 65 - 99 mg/dL   BUN 25 (H) 6 - 20 mg/dL   Creatinine, Ser 7.82 0.44 - 1.00 mg/dL   Calcium 9.7 8.9 - 95.6 mg/dL   Total Protein 7.2 6.5 - 8.1 g/dL   Albumin 4.1 3.5 - 5.0 g/dL   AST 39 15 - 41 U/L   ALT 37 14 - 54 U/L   Alkaline Phosphatase 127 (H) 38 - 126 U/L   Total Bilirubin 0.3 0.3 - 1.2 mg/dL   GFR calc non Af Amer >60 >60 mL/min   GFR calc Af Amer >60 >60 mL/min   Anion gap 10 5 - 15         MDM   1. Abdominal pain, acute, epigastric   2. Slow transit constipation   3. Abnormal EKG    Patient was informed EKG is abnormal but not just not different from the one in August. Since we can compare it with a previous EKG I will have her follow-up with Dr. Donaciano Eva as far as further testing or cardiology referral. Lab work was essentially normal with blood sugar being 145 the patient is diabetic and BUN was 25. GI cocktail didn't really help much however 3 with abdomen showed the patient was still  with a heavy stool burden despite having bowel movements on Saturday and multiple bowel movements today. Talked about using half bottle mag citrate tomorrow morning a repeat in the afternoon before today she is using a stool softener. Follow Dr. Donaciano Eva in 24-48 hours if not improved.    Hassan Rowan, MD 07/01/15 [phone]

## 2015-07-01 NOTE — ED Notes (Signed)
Patient states she has been having stomach and abdominal discomfort since Friday evening after eating" too much"

## 2015-07-02 ENCOUNTER — Telehealth: Payer: Self-pay

## 2015-07-02 MED ORDER — CELECOXIB 200 MG PO CAPS: 200.0000 mg | ORAL_CAPSULE | Freq: Every day | ORAL | Status: DC

## 2015-07-02 NOTE — Telephone Encounter (Signed)
[facility] requesting refill Celecoxib 200mg  tab QD

## 2015-07-11 ENCOUNTER — Encounter: Payer: Self-pay | Admitting: Family Medicine

## 2015-07-11 ENCOUNTER — Ambulatory Visit (INDEPENDENT_AMBULATORY_CARE_PROVIDER_SITE_OTHER): Payer: Medicare Other | Admitting: Family Medicine

## 2015-07-11 ENCOUNTER — Encounter: Payer: Self-pay | Admitting: *Deleted

## 2015-07-11 VITALS — BP 148/78 | HR 92 | Temp 97.9°F | Wt 204.0 lb

## 2015-07-11 DIAGNOSIS — K439 Ventral hernia without obstruction or gangrene: Secondary | ICD-10-CM

## 2015-07-11 DIAGNOSIS — K5900 Constipation, unspecified: Secondary | ICD-10-CM

## 2015-07-11 DIAGNOSIS — R9431 Abnormal electrocardiogram [ECG] [EKG]: Secondary | ICD-10-CM | POA: Diagnosis not present

## 2015-07-11 DIAGNOSIS — R1084 Generalized abdominal pain: Secondary | ICD-10-CM

## 2015-07-11 DIAGNOSIS — K5730 Diverticulosis of large intestine without perforation or abscess without bleeding: Secondary | ICD-10-CM | POA: Diagnosis not present

## 2015-07-11 DIAGNOSIS — R5383 Other fatigue: Secondary | ICD-10-CM

## 2015-07-11 HISTORY — DX: Constipation, unspecified: K59.00

## 2015-07-11 HISTORY — DX: Morbid (severe) obesity due to excess calories: E66.01

## 2015-07-11 HISTORY — DX: Other fatigue: R53.83

## 2015-07-11 HISTORY — DX: Generalized abdominal pain: R10.84

## 2015-07-11 LAB — CBC WITH DIFFERENTIAL/PLATELET
Hematocrit: 40 % (ref 34.0–46.6)
Hemoglobin: 13.5 g/dL (ref 11.1–15.9)
LYMPHS ABS: 1.8 10*3/uL (ref 0.7–3.1)
LYMPHS: 24 %
MCH: 29.9 pg (ref 26.6–33.0)
MCHC: 33.8 g/dL (ref 31.5–35.7)
MCV: 89 fL (ref 79–97)
MID (ABSOLUTE): 0.5 10*3/uL (ref 0.1–1.6)
MID: 7 %
NEUTROS PCT: 70 %
Neutrophils Absolute: 5.4 10*3/uL (ref 1.4–7.0)
PLATELETS: 294 10*3/uL (ref 150–379)
RBC: 4.51 x10E6/uL (ref 3.77–5.28)
RDW: 14.6 % (ref 12.3–15.4)
WBC: 7.7 10*3/uL (ref 3.4–10.8)

## 2015-07-11 LAB — UA/M W/RFLX CULTURE, ROUTINE
Bilirubin, UA: NEGATIVE
Ketones, UA: NEGATIVE
LEUKOCYTES UA: NEGATIVE
Nitrite, UA: NEGATIVE
Protein, UA: NEGATIVE
RBC, UA: NEGATIVE
Specific Gravity, UA: 1.025 (ref 1.005–1.030)
Urobilinogen, Ur: 0.2 mg/dL (ref 0.2–1.0)
pH, UA: 5 (ref 5.0–7.5)

## 2015-07-11 MED ORDER — ASPIRIN EC 81 MG PO TBEC: 81.0000 mg | DELAYED_RELEASE_TABLET | Freq: Every day | ORAL | Status: DC

## 2015-07-11 NOTE — Assessment & Plan Note (Signed)
Urine today did not show signs of infection; CBC did not show elevated white count, and there was a normal H/H with normal MCV and normal MCH which leans away from slow GI blood loss; colonoscopy report from May 27, 2013 reviewed; next due in 5 years; abdominal hernia present, will refer to general surgeon; reasons to seek immediate attention reviewed; constipation, start miralax, increase fiber and water; do NOT take metformin is sick, vomiting, dehydrated now or in the future; discussed risk of lactic acidosis; return for f/u with primary in one week

## 2015-07-11 NOTE — Assessment & Plan Note (Signed)
Refer to general surgeon; discussed s/s of incarcerated hernia, reasons to seek immediate medical help; weight loss encouraged

## 2015-07-11 NOTE — Assessment & Plan Note (Addendum)
Repeated today; today's and previous EKG suggestive of prior inferior infarct; she is diabetic and obese; will refer to cardiologist; start aspirin; take at least one hour PRIOR to Celebrex; call 911 if chest pain/pressure, symptoms of heart attack

## 2015-07-11 NOTE — Assessment & Plan Note (Signed)
With constipation; check thyroid function

## 2015-07-11 NOTE — Assessment & Plan Note (Signed)
Obesity with diabetes and high cholesterol; encouraged weight loss; see AVS

## 2015-07-11 NOTE — Progress Notes (Signed)
BP 148/78 mmHg  Pulse 92  Temp(Src) 97.9 F (36.6 C)  Wt 204 lb (92.534 kg)  SpO2 97%   Subjective:    Patient ID: Alyssa Mayo, female    DOB: 02/27/1946, 69 y.o.   MRN: [id number]  HPI: Alyssa Mayo is a 69 y.o. female  Chief Complaint  Patient presents with  . Constipation    had a dinner on the 9th, she ate and felt her hernia act up, went to urgent care next day, they told her she was constipated. Stomach was distended and felt "movement" in her belly. Still had pain across her midsection. Same thing happened this past Saturday, felt like something was once again moving in there and then got chills. The "movement" was gone the next day. She tried Magensium Citrate and finally got things moving. She feels better, but wants to know what caused it.   She had a big dinner on December 9th and her hernia (abdominal) acted up; stomach was distended She's used to eating on a small plate; it wasn't a heaping plate, regular 9 or 10" plate full of food She went to urgent care on Sunday for the abdominal pain; she did have a BM on Saturday and two on Sunday They did xrays and they said she was full of stool; they told her take a stool softener and that didn't work to drink mag citrate She took the stool softener and felt better; never had to drink the liquid Still having discomfort across the abdomen; bending forward put pressure on her abdomen This past Saturday, everything went kaplooey She couldn't sleep, was tossing around, so she got up and thought if she shifted around and things would move better Saturday night, her stomach felt so tight; felt pregnant and moved around; this past Sat night it was worse than the other Saturday night; she got chills; then had dry heaves on Sunday, not sure if from the mag citrate Wonders if diverticulitis; had diverticulosis notes on last scan No alcohol, no laxatives No reflux; but would get choked and the PPI has stopped that She was found to have an  abnormal EKG in August and her primary mentioned getting cardiology referral; urgent care also found abnormal EKG and mentioned cardiology follow-up; she does not recall ever having had a heart attack; then she recalls an episode about two years in which her leg got heavy  Relevant past medical, surgical, family and social history reviewed and updated as indicated Interim medical history since our last visit reviewed. Allergies and medications reviewed and updated.  Review of Systems  Cardiovascular: Negative for chest pain.  Gastrointestinal: Positive for abdominal pain and abdominal distention.   Per HPI unless specifically indicated above     Objective:    BP 148/78 mmHg  Pulse 92  Temp(Src) 97.9 F (36.6 C)  Wt 204 lb (92.534 kg)  SpO2 97%  Wt Readings from Last 3 Encounters:  07/11/15 204 lb (92.534 kg)  07/01/15 210 lb (95.255 kg)  06/04/15 209 lb (94.802 kg)  body mass index is 36.15 kg/(m^2).  Physical Exam  Constitutional: She appears well-developed and well-nourished. No distress.  Weight down five pounds in five weeks;  obese  HENT:  Head: Normocephalic and atraumatic.  Mouth/Throat: Mucous membranes are normal. Mucous membranes are not dry.  Eyes: EOM are normal. No scleral icterus.  Neck: No thyromegaly present.  Cardiovascular: Normal rate, regular rhythm and normal heart sounds.   No murmur heard. Pulmonary/Chest: Effort normal and  breath sounds normal. No respiratory distress. She has no wheezes.  Abdominal: Soft. Bowel sounds are normal. She exhibits no distension and no mass. There is no tenderness. There is no guarding.  Obese; ventral abdominal wall hernia midline above the umbilicus, several centimeters; soft, reducible  Musculoskeletal: Normal range of motion. She exhibits no edema.  Neurological: She is alert. She exhibits normal muscle tone.  Skin: Skin is warm and dry. No rash noted. She is not diaphoretic. No pallor.  Psychiatric: She has a normal  mood and affect. Her behavior is normal. Judgment and thought content normal.   Results for orders placed or performed during the hospital encounter of 07/01/15  CBC with Differential  Result Value Ref Range   WBC 10.5 3.6 - 11.0 K/uL   RBC 4.57 3.80 - 5.20 MIL/uL   Hemoglobin 13.1 12.0 - 16.0 g/dL   HCT 16.1 09.6 - 04.5 %   MCV 86.0 80.0 - 100.0 fL   MCH 28.7 26.0 - 34.0 pg   MCHC 33.4 32.0 - 36.0 g/dL   RDW 40.9 (H) 81.1 - 91.4 %   Platelets 266 150 - 440 K/uL   Neutrophils Relative % 69 %   Neutro Abs 7.3 (H) 1.4 - 6.5 K/uL   Lymphocytes Relative 22 %   Lymphs Abs 2.3 1.0 - 3.6 K/uL   Monocytes Relative 6 %   Monocytes Absolute 0.6 0.2 - 0.9 K/uL   Eosinophils Relative 2 %   Eosinophils Absolute 0.2 0 - 0.7 K/uL   Basophils Relative 1 %   Basophils Absolute 0.1 0 - 0.1 K/uL  Amylase  Result Value Ref Range   Amylase 76 28 - 100 U/L  Lipase, blood  Result Value Ref Range   Lipase 35 11 - 51 U/L  Comprehensive metabolic panel  Result Value Ref Range   Sodium 139 135 - 145 mmol/L   Potassium 4.3 3.5 - 5.1 mmol/L   Chloride 105 101 - 111 mmol/L   CO2 24 22 - 32 mmol/L   Glucose, Bld 145 (H) 65 - 99 mg/dL   BUN 25 (H) 6 - 20 mg/dL   Creatinine, Ser 7.82 0.44 - 1.00 mg/dL   Calcium 9.7 8.9 - 95.6 mg/dL   Total Protein 7.2 6.5 - 8.1 g/dL   Albumin 4.1 3.5 - 5.0 g/dL   AST 39 15 - 41 U/L   ALT 37 14 - 54 U/L   Alkaline Phosphatase 127 (H) 38 - 126 U/L   Total Bilirubin 0.3 0.3 - 1.2 mg/dL   GFR calc non Af Amer >60 >60 mL/min   GFR calc Af Amer >60 >60 mL/min   Anion gap 10 5 - 15      Assessment & Plan:   Problem List Items Addressed This Visit      Digestive   Constipation    Check TSH; increase fiber and water; start Miralax; abd discomfort may all be from constipation, but we'll have her f/u to make sure resolving with primary      Relevant Orders   TSH   T4, free   Colon, diverticulosis    Noted on colonoscopy report, personally reviewed by me (dated  May 27, 2013); ddx for abdominal pain includes diverticulitis; her WBC is normal today; fiber and water and activity like walking suggested        Other   Abdominal pain, acute, generalized - Primary    Urine today did not show signs of infection; CBC did not show elevated  white count, and there was a normal H/H with normal MCV and normal MCH which leans away from slow GI blood loss; colonoscopy report from May 27, 2013 reviewed; next due in 5 years; abdominal hernia present, will refer to general surgeon; reasons to seek immediate attention reviewed; constipation, start miralax, increase fiber and water; do NOT take metformin is sick, vomiting, dehydrated now or in the future; discussed risk of lactic acidosis; return for f/u with primary in one week      Relevant Orders   UA/M w/rflx Culture, Routine   CBC With Differential/Platelet   Comprehensive metabolic panel   Fatigue    With constipation; check thyroid function      Relevant Orders   TSH   T4, free   Abnormal finding on EKG    Repeated today; today's and previous EKG suggestive of prior inferior infarct; she is diabetic and obese; will refer to cardiologist; start aspirin; take at least one hour PRIOR to Celebrex; call 911 if chest pain/pressure, symptoms of heart attack      Relevant Orders   Ambulatory referral to Cardiology   TSH   T4, free   EKG 12-Lead (Completed)   Morbid obesity (HCC)    Obesity with diabetes and high cholesterol; encouraged weight loss; see AVS      Abdominal wall hernia    Refer to general surgeon; discussed s/s of incarcerated hernia, reasons to seek immediate medical help; weight loss encouraged      Relevant Orders   Ambulatory referral to General Surgery      Follow up plan: Return in about 1 week (around 07/18/2015) for Dr. Dossie Arbour, constipation.  An after-visit summary was printed and given to the patient at check-out.  Please see the patient instructions which may contain other  information and recommendations beyond what is mentioned above in the assessment and plan. Orders Placed This Encounter  Procedures  . UA/M w/rflx Culture, Routine  . CBC With Differential/Platelet  . Comprehensive metabolic panel  . TSH  . T4, free  . Ambulatory referral to Cardiology  . Ambulatory referral to General Surgery  . EKG 12-Lead   Labs reviewed today with patient in-house were her urine dip and CBC with diff

## 2015-07-11 NOTE — Patient Instructions (Addendum)
Please start Miralax and take per package directions every day Drink 64 ounces of water day and get 25 or more grams of fiber daily (work up to both gradually if not there already) Try to get more activity We'll refer you to the general surgeon about your hernia We'll refer you to the cardiologist about your abnormal EKG Take 81 mg coated baby aspirin one hour or more BEFORE your Celebrex If you ever develop chest or pressure, call 911 If you abdominal pain worsens, your abdominal hernia gets hard or turns dark or you get nauseated or vomit, go right to the emergency departement If you ever get dehydrated or sick, skip your metoformin until feeling better Return in one week with primary; if your symptoms are not improving, then we'll consider getting further imaging (based on how you're doing next week, or sooner if you are getting worse) One of [place] is always on-call, even over the holiday weekend, so call the main office number to speak with a nurse or provider if needed If you pain gets severe, just go on to the ER Check out the information at [email or web address] entitled "What It Takes to Lose Weight" Try to lose between 1-2 pounds per week by taking in fewer calories and burning off more calories You can succeed by limiting portions, limiting foods dense in calories and fat, becoming more active, and drinking 8 glasses of water a day (64 ounces) Don't skip meals, especially breakfast, as skipping meals may alter your metabolism Do not use over-the-counter weight loss pills or gimmicks that claim rapid weight loss A healthy BMI (or body mass index) is between 18.5 and 24.9 You can calculate your ideal BMI at the NIH website [employer]

## 2015-07-11 NOTE — Assessment & Plan Note (Signed)
Noted on colonoscopy report, personally reviewed by me (dated May 27, 2013); ddx for abdominal pain includes diverticulitis; her WBC is normal today; fiber and water and activity like walking suggested

## 2015-07-11 NOTE — Assessment & Plan Note (Signed)
Check TSH; increase fiber and water; start Miralax; abd discomfort may all be from constipation, but we'll have her f/u to make sure resolving with primary

## 2015-07-12 LAB — COMPREHENSIVE METABOLIC PANEL
ALBUMIN: 4.1 g/dL (ref 3.5–4.8)
ALK PHOS: 149 IU/L — AB (ref 39–117)
ALT: 36 IU/L — ABNORMAL HIGH (ref 0–32)
AST: 19 IU/L (ref 0–40)
Albumin/Globulin Ratio: 1.5 (ref 1.1–2.5)
BUN/Creatinine Ratio: 25 (ref 11–26)
BUN: 23 mg/dL (ref 8–27)
Bilirubin Total: 0.2 mg/dL (ref 0.0–1.2)
CALCIUM: 9.6 mg/dL (ref 8.7–10.3)
CO2: 20 mmol/L (ref 18–29)
CREATININE: 0.92 mg/dL (ref 0.57–1.00)
Chloride: 98 mmol/L (ref 96–106)
GFR calc Af Amer: 73 mL/min/{1.73_m2} (ref >59)
GFR calc non Af Amer: 63 mL/min/{1.73_m2} (ref >59)
GLUCOSE: 174 mg/dL — AB (ref 65–99)
Globulin, Total: 2.8 g/dL (ref 1.5–4.5)
Potassium: 4.6 mmol/L (ref 3.5–5.2)
Sodium: 137 mmol/L (ref 134–144)
Total Protein: 6.9 g/dL (ref 6.0–8.5)

## 2015-07-12 LAB — T4, FREE: Free T4: 1.44 ng/dL (ref 0.82–1.77)

## 2015-07-12 LAB — TSH: TSH: 0.925 u[IU]/mL (ref 0.450–4.500)

## 2015-07-13 ENCOUNTER — Encounter: Payer: Self-pay | Admitting: Cardiovascular Disease

## 2015-07-13 ENCOUNTER — Ambulatory Visit (INDEPENDENT_AMBULATORY_CARE_PROVIDER_SITE_OTHER): Payer: Medicare Other | Admitting: Cardiovascular Disease

## 2015-07-13 VITALS — BP 124/76 | HR 79 | Ht 63.0 in | Wt 205.2 lb

## 2015-07-13 DIAGNOSIS — R9431 Abnormal electrocardiogram [ECG] [EKG]: Secondary | ICD-10-CM

## 2015-07-13 DIAGNOSIS — I10 Essential (primary) hypertension: Secondary | ICD-10-CM

## 2015-07-13 DIAGNOSIS — R0602 Shortness of breath: Secondary | ICD-10-CM | POA: Diagnosis not present

## 2015-07-13 DIAGNOSIS — E785 Hyperlipidemia, unspecified: Secondary | ICD-10-CM | POA: Diagnosis not present

## 2015-07-13 NOTE — Patient Instructions (Addendum)
Medication Instructions:  Your physician recommends that you continue on your current medications as directed. Please refer to the Current Medication list given to you today.  Labwork: none  Testing/Procedures: Your physician has requested that you have a lexiscan myoview. For further information please visit [email or web address] Please follow instruction sheet, as given.  [facility] MYOVIEW  Your caregiver has ordered a Stress Test with nuclear imaging. The purpose of this test is to evaluate the blood supply to your heart muscle. This procedure is referred to as a "Non-Invasive Stress Test." This is because other than having an IV started in your vein, nothing is inserted or "invades" your body. Cardiac stress tests are done to find areas of poor blood flow to the heart by determining the extent of coronary artery disease (CAD). Some patients exercise on a treadmill, which naturally increases the blood flow to your heart, while others who are  unable to walk on a treadmill due to physical limitations have a pharmacologic/chemical stress agent called Lexiscan . This medicine will mimic walking on a treadmill by temporarily increasing your coronary blood flow.   Please note: these test may take anywhere between 2-4 hours to complete  PLEASE REPORT TO [facility] MEDICAL MALL ENTRANCE  THE VOLUNTEERS AT THE FIRST DESK WILL DIRECT YOU WHERE TO GO  Date of Procedure:  Thursday, Dec. 29, 8am Arrival Time for Procedure: 7:30am  Instructions regarding medication:   _xx___ : Hold metformin 24 hours before and 48 hours after procedure.   PLEASE NOTIFY THE OFFICE AT LEAST 24 HOURS IN ADVANCE IF YOU ARE UNABLE TO KEEP YOUR APPOINTMENT.  [phone] AND  PLEASE NOTIFY NUCLEAR MEDICINE AT [facility] AT LEAST 24 HOURS IN ADVANCE IF YOU ARE UNABLE TO KEEP YOUR APPOINTMENT. [phone]  How to prepare for your Myoview test:   Do not eat or drink after midnight  No caffeine for 24 hours prior to test  No smoking  24 hours prior to test.  Your medication may be taken with water.  If your doctor stopped a medication because of this test, do not take that medication.  Ladies, please do not wear dresses.  Skirts or pants are appropriate. Please wear a short sleeve shirt.  No perfume, cologne or lotion.  Wear comfortable walking shoes. No heels!            Follow-Up: Your physician recommends that you schedule a follow-up appointment as needed.    Any Other Special Instructions Will Be Listed Below (If Applicable).     If you need a refill on your cardiac medications before your next appointment, please call your pharmacy.  Cardiac Nuclear Scanning A cardiac nuclear scan is used to check your heart for problems, such as the following:  A portion of the heart is not getting enough blood.  Part of the heart muscle has died, which happens with a heart attack.  The heart wall is not working normally.  In this test, a radioactive dye (tracer) is injected into your bloodstream. After the tracer has traveled to your heart, a scanning device is used to measure how much of the tracer is absorbed by or distributed to various areas of your heart. LET [facility] CARE PROVIDER KNOW ABOUT:  Any allergies you have.  All medicines you are taking, including vitamins, herbs, eye drops, creams, and over-the-counter medicines.  Previous problems you or members of your family have had with the use of anesthetics.  Any blood disorders you have.  Previous surgeries you have had.  Medical conditions you have.  RISKS AND COMPLICATIONS Generally, this is a safe procedure. However, as with any procedure, problems can occur. Possible problems include:   Serious chest pain.  Rapid heartbeat.  Sensation of warmth in your chest. This usually passes quickly. BEFORE THE PROCEDURE Ask your health care provider about changing or stopping your regular medicines. PROCEDURE This procedure is usually done  at a hospital and takes 2-4 hours.  An IV tube is inserted into one of your veins.  Your health care provider will inject a small amount of radioactive tracer through the tube.  You will then wait for 20-40 minutes while the tracer travels through your bloodstream.  You will lie down on an exam table so images of your heart can be taken. Images will be taken for about 15-20 minutes.  You will exercise on a treadmill or stationary bike. While you exercise, your heart activity will be monitored with an electrocardiogram (ECG), and your blood pressure will be checked.  If you are unable to exercise, you may be given a medicine to make your heart beat faster.  When blood flow to your heart has peaked, tracer will again be injected through the IV tube.  After 20-40 minutes, you will get back on the exam table and have more images taken of your heart.  When the procedure is over, your IV tube will be removed. AFTER THE PROCEDURE  You will likely be able to leave shortly after the test. Unless your health care provider tells you otherwise, you may return to your normal schedule, including diet, activities, and medicines.  Make sure you find out how and when you will get your test results.   This information is not intended to replace advice given to you by your health care provider. Make sure you discuss any questions you have with your health care provider.   Document Released: 08/01/2004 Document Revised: 07/12/2013 Document Reviewed: 06/15/2013 Elsevier Interactive Patient Education [employer].

## 2015-07-17 ENCOUNTER — Telehealth: Payer: Self-pay | Admitting: Family Medicine

## 2015-07-17 ENCOUNTER — Telehealth: Payer: Self-pay

## 2015-07-17 DIAGNOSIS — R748 Abnormal levels of other serum enzymes: Secondary | ICD-10-CM

## 2015-07-17 DIAGNOSIS — I10 Essential (primary) hypertension: Secondary | ICD-10-CM | POA: Insufficient documentation

## 2015-07-17 DIAGNOSIS — R7401 Elevation of levels of liver transaminase levels: Secondary | ICD-10-CM | POA: Insufficient documentation

## 2015-07-17 DIAGNOSIS — R740 Nonspecific elevation of levels of transaminase and lactic acid dehydrogenase [LDH]: Secondary | ICD-10-CM

## 2015-07-17 DIAGNOSIS — K760 Fatty (change of) liver, not elsewhere classified: Secondary | ICD-10-CM

## 2015-07-17 HISTORY — DX: Abnormal levels of other serum enzymes: R74.8

## 2015-07-17 NOTE — Telephone Encounter (Signed)
Reviewed treadmill myoview instructions w/pt. Informed pt to only hold metformin the morning of the test and may resume after test complete. Pt verbalized understanding with no questions.

## 2015-07-17 NOTE — Assessment & Plan Note (Signed)
She is currently on simvastatin 40 mg once daily. Recommend an LDL target of less than 70 given that she is diabetic.

## 2015-07-17 NOTE — Telephone Encounter (Signed)
I reviewed [facility] labs going back to Jan 2013; I do not see alk phos elevations I called patient, explained that her alk phos is up just a little bit No abdominal pain; she has some pain in her hips sometimes, not all the time, over the last year; comes and goes She will get a stress test Thursday morning, she can come by for labs after that; NON-fasting We discussed how high her TG were in November; of course, this is a bad time of year for checking with the holidays Discussed fatty liver, noted on CT scan from 2014; abnormalities seen were secondary to Burch procedure per radiologist; urine neg for blood last week Discussed treatment for fatty liver, weight loss and lowfat diet Will have her come back for blood work and then decide how to proceed (liver US, CT scan, bone density, etc.) She agrees with plan

## 2015-07-17 NOTE — Progress Notes (Signed)
Primary care physician: Dr. CrissmaDossie ArbourI  This is a pleasant 69 year old female who was referred by Dr. Sherie Don for evaluation of an abnormal EKG. She has no previous cardiac history and reports having a stress test many years ago which was unremarkable. No recent cardiac evaluation. She has known history of type 2 diabetes, hypertension, hyperlipidemia and obesity. She is not a smoker. There is no family history of coronary artery disease. 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 Most recently, she had recurrent episodes of abdominal pain which was thought to be due to constipation and "overeating"according to the patient. She occasionally has some chest pain described as indigestion with certain food but she does not have any substernal chest tightness with activities. She does report dyspnea with minimal activities which has been chronic. She denies orthopnea, PND or leg edema.  Allergies  Allergen Reactions  . Sulfa Antibiotics      Current Outpatient Prescriptions on File Prior to Visit  Medication Sig Dispense Refill  . aspirin EC 81 MG tablet Take 1 tablet (81 mg total) by mouth daily. Take at least one hour BEFORE the Celebrex 30 tablet 11  . benazepril (LOTENSIN) 20 MG tablet Take 1 tablet (20 mg total) by mouth daily. 90 tablet 3  . canagliflozin (INVOKANA) 300 MG TABS tablet Take 300 mg by mouth daily before breakfast.    . celecoxib (CELEBREX) 200 MG capsule Take 1 capsule (200 mg total) by mouth daily. 30 capsule 6  . metFORMIN (GLUCOPHAGE) 500 MG tablet TAKE (2) TABLETS TWICE DAILY. 120 tablet 7  . pantoprazole (PROTONIX) 40 MG tablet Take 40 mg by mouth.    . simvastatin (ZOCOR) 40 MG tablet Take 40 mg by mouth daily.     No current facility-administered medications on file prior to visit.     Past Medical History  Diagnosis Date  . Fatty liver   . Sleep apnea   . Arthritis   . Elevated liver enzymes   .  Diabetes mellitus without complication (HCC)   . Lifelong obesity   . Hyperlipidemia   . Hypertension      Past Surgical History  Procedure Laterality Date  . Cervical laminectomy  1987  . Appendectomy    . Cesarean section    . Abdominal hysterectomy    . Tubal ligation    . Colonoscopy  diverticulosis  . Spine surgery       Family History  Problem Relation Age of Onset  . Cancer Mother     breast  . Heart disease Mother   . Stroke Mother   . Diabetes Mother   . Cancer Father     leukemia  . Stroke Father   . Cancer Brother   . COPD Neg Hx   . Hypertension Neg Hx      Social History   Social History  . Marital Status: Married    Spouse Name: N/A  . Number of Children: N/A  . Years of Education: N/A   Occupational History  . Not on file.   Social History Main Topics  . Smoking status: Never Smoker   . Smokeless tobacco: Never Used  . Alcohol Use: 0.0 oz/week    0 Standard drinks or equivalent per week     Comment: rare  . Drug Use: No  . Sexual Activity: Not on file   Other Topics Concern  . Not on file   Social History Narrative  ROS A 10 point review of system was performed. It is negative other than that mentioned in the history of present illness.   PHYSICAL EXAM   BP 124/76 mmHg  Pulse 79  Ht 5\' 3"  (1.6 m)  Wt 205 lb 4 oz (93.101 kg)  BMI 36.37 kg/m2 Constitutional: She is oriented to person, place, and time. She appears well-developed and well-nourished. No distress.  HENT: No nasal discharge.  Head: Normocephalic and atraumatic.  Eyes: Pupils are equal and round. No discharge.  Neck: Normal range of motion. Neck supple. No JVD present. No thyromegaly present.  Cardiovascular: Normal rate, regular rhythm, normal heart sounds. Exam reveals no gallop and no friction rub. No murmur heard.  Pulmonary/Chest: Effort normal and breath sounds normal. No stridor. No respiratory distress. She has no wheezes. She has no rales. She exhibits  no tenderness.  Abdominal: Soft. Bowel sounds are normal. She exhibits no distension. There is no tenderness. There is no rebound and no guarding.  Musculoskeletal: Normal range of motion. She exhibits no edema and no tenderness.  Neurological: She is alert and oriented to person, place, and time. Coordination normal.  Skin: Skin is warm and dry. No rash noted. She is not diaphoretic. No erythema. No pallor.  Psychiatric: She has a normal mood and affect. Her behavior is normal. Judgment and thought content normal.     EKG: Normal sinus rhythm with no significant ST or T wave changes. Isolated Q-wave in lead 3 likely a normal variant.   ASSESSMENT AND PLAN

## 2015-07-17 NOTE — Assessment & Plan Note (Signed)
The patient's previous EKG was suggestive of prior inferior infarct. On our EKG today, she has an isolated Q-wave in lead 3 which very often is a normal variant and does not necessarily indicate the presence of previous myocardial infarction. Nonetheless, the patient has multiple risk factors for coronary artery disease including diabetes and does complain of dyspnea with minimal activities, I think it's important to exclude angina equivalent and thus I recommend evaluation with a treadmill nuclear stress test. If stress test is negative, I recommend continued aggressive treatment of risk factors.

## 2015-07-17 NOTE — Progress Notes (Signed)
Yours

## 2015-07-17 NOTE — Assessment & Plan Note (Signed)
Blood pressure is well controlled on Lotensin.

## 2015-07-18 ENCOUNTER — Other Ambulatory Visit: Payer: Self-pay

## 2015-07-18 MED ORDER — SIMVASTATIN 40 MG PO TABS: 40.0000 mg | ORAL_TABLET | Freq: Every day | ORAL | Status: DC

## 2015-07-18 NOTE — Telephone Encounter (Signed)
Patient seen in November and December, has appointment 07/24/2015 [employer]

## 2015-07-19 ENCOUNTER — Other Ambulatory Visit: Payer: Medicare Other

## 2015-07-19 ENCOUNTER — Ambulatory Visit
Admission: RE | Admit: 2015-07-19 | Discharge: 2015-07-19 | Disposition: A | Discharge status: Home / Self Care | Payer: Medicare Other | Source: Ambulatory Visit | Attending: Cardiovascular Disease | Admitting: Cardiovascular Disease

## 2015-07-19 DIAGNOSIS — R748 Abnormal levels of other serum enzymes: Secondary | ICD-10-CM

## 2015-07-19 DIAGNOSIS — R7401 Elevation of levels of liver transaminase levels: Secondary | ICD-10-CM

## 2015-07-19 DIAGNOSIS — R740 Nonspecific elevation of levels of transaminase and lactic acid dehydrogenase [LDH]: Secondary | ICD-10-CM

## 2015-07-19 DIAGNOSIS — R0602 Shortness of breath: Secondary | ICD-10-CM | POA: Diagnosis not present

## 2015-07-19 DIAGNOSIS — R9431 Abnormal electrocardiogram [ECG] [EKG]: Secondary | ICD-10-CM | POA: Diagnosis not present

## 2015-07-19 LAB — NM MYOCAR MULTI W/SPECT W/WALL MOTION / EF
CHL CUP NUCLEAR SDS: 2
CHL CUP NUCLEAR SRS: 7
CHL CUP NUCLEAR SSS: 7
CHL CUP RESTING HR STRESS: 88 {beats}/min
CHL CUP STRESS STAGE 2 GRADE: 0 %
CHL CUP STRESS STAGE 2 HR: 95 {beats}/min
CHL CUP STRESS STAGE 2 SPEED: 0.1 mph
CHL CUP STRESS STAGE 3 SPEED: 0.1 mph
CHL CUP STRESS STAGE 5 GRADE: 12 %
CHL CUP STRESS STAGE 6 SPEED: 0 mph
CHL CUP STRESS STAGE 7 HR: 98 {beats}/min
CSEPEW: 6 METS
CSEPHR: 104 %
CSEPPMHR: 100 %
Exercise duration (min): 4 min
LVDIAVOL: 53 mL
LVSYSVOL: 22 mL
NUC STRESS TID: 0.64
Peak BP: 146 mmHg
Peak HR: 151 {beats}/min
Stage 1 HR: 96 {beats}/min
Stage 3 Grade: 0 %
Stage 3 HR: 95 {beats}/min
Stage 4 Grade: 10 %
Stage 4 HR: 137 {beats}/min
Stage 4 Speed: 1.7 mph
Stage 5 DBP: 112 mmHg
Stage 5 HR: 151 {beats}/min
Stage 5 SBP: 146 mmHg
Stage 5 Speed: 2.5 mph
Stage 6 Grade: 0 %
Stage 6 HR: 131 {beats}/min
Stage 7 Grade: 0 %
Stage 7 Speed: 0 mph

## 2015-07-19 MED ORDER — TECHNETIUM TC 99M SESTAMIBI GENERIC - CARDIOLITE
10.0000 | Freq: Once | INTRAVENOUS | Status: AC | PRN
  Administered 2015-07-19: 13.757 via INTRAVENOUS

## 2015-07-19 MED ORDER — TECHNETIUM TC 99M SESTAMIBI - CARDIOLITE
31.5350 | Freq: Once | INTRAVENOUS | Status: AC | PRN
  Administered 2015-07-19: 31.535 via INTRAVENOUS

## 2015-07-19 MED ORDER — REGADENOSON 0.4 MG/5ML IV SOLN
0.4000 mg | Freq: Once | INTRAVENOUS | Status: AC
  Administered 2015-07-19: 0.4 mg via INTRAVENOUS
  Filled 2015-07-19: qty 5

## 2015-07-20 ENCOUNTER — Telehealth: Payer: Self-pay | Admitting: Family Medicine

## 2015-07-20 DIAGNOSIS — R7401 Elevation of levels of liver transaminase levels: Secondary | ICD-10-CM

## 2015-07-20 DIAGNOSIS — R748 Abnormal levels of other serum enzymes: Secondary | ICD-10-CM

## 2015-07-20 DIAGNOSIS — R740 Nonspecific elevation of levels of transaminase and lactic acid dehydrogenase [LDH]: Secondary | ICD-10-CM

## 2015-07-20 LAB — ALKALINE PHOSPHATASE, ISOENZYMES
BONE FRACTION: 47 % (ref 14–68)
INTESTINAL FRAC.: 0 % (ref 0–18)
LIVER FRACTION: 53 % (ref 18–85)

## 2015-07-20 LAB — MITOCHONDRIAL ANTIBODIES: MITOCHONDRIAL AB: 5.1 U (ref 0.0–20.0)

## 2015-07-20 LAB — VITAMIN D 25 HYDROXY (VIT D DEFICIENCY, FRACTURES): Vit D, 25-Hydroxy: 9.8 ng/mL — ABNORMAL LOW (ref 30.0–100.0)

## 2015-07-20 LAB — ANTI-SMOOTH MUSCLE ANTIBODY, IGG: SMOOTH MUSCLE AB: 27 U — AB (ref 0–19)

## 2015-07-20 LAB — GAMMA GT: GGT: 401 IU/L — ABNORMAL HIGH (ref 0–60)

## 2015-07-20 LAB — ANA W/REFLEX IF POSITIVE: ANA: NEGATIVE

## 2015-07-20 NOTE — Telephone Encounter (Signed)
I called the hospital operator on my way home tonight to talk with GI on-call about the high GGT before long holiday weekend After 30 minutes, I had not received a call back

## 2015-07-20 NOTE — Telephone Encounter (Signed)
I called patient to discuss labs that are back so far; left msg to call us back or I'll try again later

## 2015-07-20 NOTE — Telephone Encounter (Signed)
Dr. Shelle Iron did call back; nothing urgent to do before the long holiday weekend; he recommended imaging and f/u outpatient in GI clinic

## 2015-07-24 ENCOUNTER — Encounter: Payer: Self-pay | Admitting: Family Medicine

## 2015-07-24 ENCOUNTER — Ambulatory Visit (INDEPENDENT_AMBULATORY_CARE_PROVIDER_SITE_OTHER): Payer: Medicare HMO | Admitting: Family Medicine

## 2015-07-24 VITALS — BP 114/72 | HR 82 | Temp 98.7°F | Ht 63.1 in | Wt 204.0 lb

## 2015-07-24 DIAGNOSIS — K5900 Constipation, unspecified: Secondary | ICD-10-CM

## 2015-07-24 DIAGNOSIS — R1084 Generalized abdominal pain: Secondary | ICD-10-CM

## 2015-07-24 DIAGNOSIS — R748 Abnormal levels of other serum enzymes: Secondary | ICD-10-CM | POA: Insufficient documentation

## 2015-07-24 NOTE — Assessment & Plan Note (Signed)
Refer to GI, order liver US 

## 2015-07-24 NOTE — Assessment & Plan Note (Signed)
Discussed constipation most likely cause of patient's symptoms patient will aggressively treat with [facility] locks 2 twice a day until good results

## 2015-07-24 NOTE — Telephone Encounter (Signed)
I called pt, left msg, would like to speak with her about labs; in the meantime, STOP metformin; we'll get additional tests I entered orders for [place] and GI referral

## 2015-07-24 NOTE — Assessment & Plan Note (Signed)
Abdominal pain discussed as above with constipation patient has pending appointment for abdominal ultrasound and referral to gastroenterologist patient will keep those appointments Will recheck liver enzymes at patient's next regular scheduled office visit in February Patient's restarted metformin

## 2015-07-24 NOTE — Progress Notes (Signed)
BP 114/72 mmHg  Pulse 82  Temp(Src) 98.7 F (37.1 C)  Ht 5' 3.1" (1.603 m)  Wt 204 lb (92.534 kg)  BMI 36.01 kg/m2  SpO2 97%   Subjective:    Patient ID: Alyssa Mayo, female    DOB: [birth year], 70 y.o.   MRN: [id number]  HPI: Alyssa Mayo is a 70 y.o. female  Chief Complaint  Patient presents with  . Abdominal Pain   patient feeling somewhat better still having some generalized abdominal pain Reviewed previous notes Patient's constipation issues and not resolves been having some small bowel movements   Relevant past medical, surgical, family and social history reviewed and updated as indicated. Interim medical history since our last visit reviewed. Allergies and medications reviewed and updated.  Review of Systems  Constitutional: Positive for appetite change. Negative for fever, chills, diaphoresis and fatigue.  Respiratory: Negative.   Cardiovascular: Negative.     Per HPI unless specifically indicated above     Objective:    BP 114/72 mmHg  Pulse 82  Temp(Src) 98.7 F (37.1 C)  Ht 5' 3.1" (1.603 m)  Wt 204 lb (92.534 kg)  BMI 36.01 kg/m2  SpO2 97%  Wt Readings from Last 3 Encounters:  07/24/15 204 lb (92.534 kg)  07/13/15 205 lb 4 oz (93.101 kg)  07/11/15 204 lb (92.534 kg)    Physical Exam  Constitutional: She is oriented to person, place, and time. She appears well-developed and well-nourished. No distress.  HENT:  Head: Normocephalic and atraumatic.  Right Ear: Hearing normal.  Left Ear: Hearing normal.  Nose: Nose normal.  Eyes: Conjunctivae and lids are normal. Right eye exhibits no discharge. Left eye exhibits no discharge. No scleral icterus.  Cardiovascular: Normal rate, regular rhythm and normal heart sounds.   Pulmonary/Chest: Effort normal and breath sounds normal. No respiratory distress.  Abdominal: Soft. She exhibits distension. She exhibits no mass. There is tenderness. There is no rebound and no guarding.  Musculoskeletal:  Normal range of motion.  Neurological: She is alert and oriented to person, place, and time.  Skin: Skin is intact. No rash noted.  Psychiatric: She has a normal mood and affect. Her speech is normal and behavior is normal. Judgment and thought content normal. Cognition and memory are normal.    Results for orders placed or performed in visit on 07/19/15  Anti-smooth muscle antibody, IgG  Result Value Ref Range   Smooth Muscle Ab 27 (H) 0 - 19 Units  Mitochondrial antibodies  Result Value Ref Range   Mitochondrial Ab 5.1 0.0 - 20.0 Units  VITAMIN D 25 Hydroxy (Vit-D Deficiency, Fractures)  Result Value Ref Range   Vit D, 25-Hydroxy 9.8 (L) 30.0 - 100.0 ng/mL  ANA w/Reflex if Positive  Result Value Ref Range   Anit Nuclear Antibody(ANA) Negative Negative  Alkaline phosphatase, isoenzymes  Result Value Ref Range   LIVER FRACTION 53 18 - 85 %   BONE FRACTION 47 14 - 68 %   INTESTINAL FRAC. 0 0 - 18 %  Gamma GT  Result Value Ref Range   GGT 401 (H) 0 - 60 IU/L      Assessment & Plan:   Problem List Items Addressed This Visit      Digestive   Constipation - Primary    Discussed constipation most likely cause of patient's symptoms patient will aggressively treat with [facility] locks 2 twice a day until good results         Other   Abdominal  pain, acute, generalized    Abdominal pain discussed as above with constipation patient has pending appointment for abdominal ultrasound and referral to gastroenterologist patient will keep those appointments Will recheck liver enzymes at patient's next regular scheduled office visit in February Patient's restarted metformin          Follow up plan: Return in about 4 weeks (around 08/21/2015) for Diabetes check Will also check CMP and A1c and GG T.

## 2015-07-24 NOTE — Assessment & Plan Note (Signed)
Refer to GI, order liver US

## 2015-07-25 NOTE — Telephone Encounter (Signed)
I talked with the patient here in the office yesterday; my computer was already off, but I sat with her and explained the situation with her lab results; she was going to see Dr. Dossie Arbour in just a few minutes, so I updated him as well Explained GGT very high; may be intrinsic liver disease, auto-immune hepatitis or primary biliary cirrhosis, but we need to make sure not cancer Will get liver imaging and refer to liver specialist She agrees

## 2015-07-30 ENCOUNTER — Ambulatory Visit: Payer: Self-pay | Admitting: General Surgery

## 2015-07-30 ENCOUNTER — Ambulatory Visit: Admission: RE | Admit: 2015-07-30 | Payer: Medicare Other | Source: Ambulatory Visit

## 2015-08-06 ENCOUNTER — Ambulatory Visit
Admission: RE | Admit: 2015-08-06 | Discharge: 2015-08-06 | Disposition: A | Discharge status: Home / Self Care | Payer: Medicare HMO | Source: Ambulatory Visit | Attending: Family Medicine | Admitting: Family Medicine

## 2015-08-06 ENCOUNTER — Telehealth: Payer: Self-pay | Admitting: Family Medicine

## 2015-08-06 DIAGNOSIS — K760 Fatty (change of) liver, not elsewhere classified: Secondary | ICD-10-CM | POA: Diagnosis not present

## 2015-08-06 DIAGNOSIS — R740 Nonspecific elevation of levels of transaminase and lactic acid dehydrogenase [LDH]: Secondary | ICD-10-CM | POA: Diagnosis not present

## 2015-08-06 DIAGNOSIS — R748 Abnormal levels of other serum enzymes: Secondary | ICD-10-CM | POA: Insufficient documentation

## 2015-08-06 DIAGNOSIS — R945 Abnormal results of liver function studies: Secondary | ICD-10-CM | POA: Diagnosis not present

## 2015-08-06 NOTE — Telephone Encounter (Signed)
Alyssa Mayo, I'm not sure if her referral has been submitted anywhere or not. I called [facility] and neither had her referral. Can you look into this?

## 2015-08-06 NOTE — Telephone Encounter (Signed)
I called patient, reviewed [place] report with her; fatty liver; no tumor; she has not heard back yet about the GI referral (entered Jan 3rd); I'll ask staff to check on that and contact her ------------------------- Amy, Please check on GI referral Thank you

## 2015-08-07 ENCOUNTER — Ambulatory Visit (INDEPENDENT_AMBULATORY_CARE_PROVIDER_SITE_OTHER): Payer: Medicare HMO | Admitting: General Surgery

## 2015-08-07 ENCOUNTER — Encounter: Payer: Self-pay | Admitting: General Surgery

## 2015-08-07 VITALS — BP 142/68 | HR 76 | Resp 14 | Ht 63.0 in | Wt 206.0 lb

## 2015-08-07 DIAGNOSIS — R69 Illness, unspecified: Secondary | ICD-10-CM | POA: Diagnosis not present

## 2015-08-07 DIAGNOSIS — M6208 Separation of muscle (nontraumatic), other site: Secondary | ICD-10-CM

## 2015-08-07 NOTE — Patient Instructions (Signed)
Patient to return as needed. 

## 2015-08-07 NOTE — Progress Notes (Signed)
Patient ID: Alyssa Mayo, female   DOB: 1946/04/21, 70 y.o.   MRN: [id number]  Chief Complaint  Patient presents with  . Other    hernia    HPI Alyssa Mayo is a 70 y.o. female here today for an evaluation of a ventral hernia. Patient states this was found when she had a colonoscopy in 2014. It has not bothered her until last month, after she ate a large meal. She states she feels a bulge. She is not having regular bowel movements. She does have constipation. She had an ultrasound done on 08/06/15 at [facility]. She did have some vomiting last weekend.   I personally reviewed the patient's history. HPI  Past Medical History  Diagnosis Date  . Fatty liver   . Sleep apnea   . Arthritis   . Elevated liver enzymes   . Diabetes mellitus without complication (HCC)   . Lifelong obesity   . Hyperlipidemia     Past Surgical History  Procedure Laterality Date  . Cervical laminectomy  1987  . Appendectomy    . Cesarean section    . Abdominal hysterectomy    . Tubal ligation    . Colonoscopy  2014    diverticulosis, [facility] Dr. [name]   . Spine surgery      Family History  Problem Relation Age of Onset  . Cancer Mother     breast  . Heart disease Mother   . Stroke Mother   . Diabetes Mother   . Cancer Father     leukemia  . Stroke Father   . Cancer Brother   . COPD Neg Hx   . Hypertension Neg Hx     Social History Social History  Substance Use Topics  . Smoking status: Never Smoker   . Smokeless tobacco: Never Used  . Alcohol Use: 0.0 oz/week    0 Standard drinks or equivalent per week     Comment: rare    Allergies  Allergen Reactions  . Sulfa Antibiotics     Current Outpatient Prescriptions  Medication Sig Dispense Refill  . aspirin EC 81 MG tablet Take 1 tablet (81 mg total) by mouth daily. Take at least one hour BEFORE the Celebrex 30 tablet 11  . benazepril (LOTENSIN) 20 MG tablet Take 1 tablet (20 mg total) by mouth daily. 90 tablet 3  . canagliflozin  (INVOKANA) 300 MG TABS tablet Take 300 mg by mouth daily before breakfast.    . celecoxib (CELEBREX) 200 MG capsule Take 1 capsule (200 mg total) by mouth daily. 30 capsule 6  . metFORMIN (GLUCOPHAGE) 1000 MG tablet Take 1,000 mg by mouth 2 (two) times daily with a meal.    . pantoprazole (PROTONIX) 40 MG tablet Take 40 mg by mouth.    . simvastatin (ZOCOR) 40 MG tablet Take 1 tablet (40 mg total) by mouth daily. 30 tablet 0   No current facility-administered medications for this visit.    Review of Systems Review of Systems  Constitutional: Negative.   Respiratory: Negative.   Cardiovascular: Negative.   Gastrointestinal: Positive for abdominal pain and constipation.    Blood pressure 142/68, pulse 76, resp. rate 14, height  (1.6 m), weight 206 lb (93.441 kg).  Physical Exam Physical Exam  Constitutional: She is oriented to person, place, and time. She appears well-developed and well-nourished.  Eyes: Conjunctivae are normal. No scleral icterus.  Neck: Neck supple.  Cardiovascular: Normal rate, regular rhythm and normal heart sounds.   Pulmonary/Chest: Effort  normal and breath sounds normal.  Abdominal: Soft. Normal appearance and bowel sounds are normal. There is no hepatomegaly. There is no tenderness. No hernia.    Lymphadenopathy:    She has no cervical adenopathy.  Neurological: She is alert and oriented to person, place, and time.  Skin: Skin is warm and dry.    DATA 08/06/2015 abdominal ultrasound reviewed. No evidence of biliary disease. Hepatic steatosis.  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    Plan    No intervention is required at this time.    Patient to return as needed.  This information has been scribed by Milas Kocher, CMA   PCP: Dr. Doristine Church, Merrily Pew 08/08/2015, 9:53 PM

## 2015-08-08 DIAGNOSIS — M6208 Separation of muscle (nontraumatic), other site: Secondary | ICD-10-CM | POA: Insufficient documentation

## 2015-08-22 DIAGNOSIS — K5900 Constipation, unspecified: Secondary | ICD-10-CM | POA: Diagnosis not present

## 2015-08-22 DIAGNOSIS — R945 Abnormal results of liver function studies: Secondary | ICD-10-CM | POA: Diagnosis not present

## 2015-08-22 DIAGNOSIS — E8801 Alpha-1-antitrypsin deficiency: Secondary | ICD-10-CM | POA: Diagnosis not present

## 2015-08-24 ENCOUNTER — Other Ambulatory Visit: Payer: Self-pay | Admitting: Unknown Physician Specialty

## 2015-08-24 NOTE — Telephone Encounter (Signed)
Your patient.  Thanks 

## 2015-08-27 ENCOUNTER — Ambulatory Visit (INDEPENDENT_AMBULATORY_CARE_PROVIDER_SITE_OTHER): Payer: Medicare HMO | Admitting: Unknown Physician Specialty

## 2015-08-27 ENCOUNTER — Encounter: Payer: Self-pay | Admitting: Unknown Physician Specialty

## 2015-08-27 VITALS — BP 136/81 | HR 96 | Temp 98.0°F | Ht 63.1 in | Wt 203.6 lb

## 2015-08-27 DIAGNOSIS — J069 Acute upper respiratory infection, unspecified: Secondary | ICD-10-CM

## 2015-08-27 DIAGNOSIS — R52 Pain, unspecified: Secondary | ICD-10-CM

## 2015-08-27 LAB — INFLUENZA A AND B
INFLUENZA A AG, EIA: NEGATIVE
INFLUENZA B AG, EIA: NEGATIVE

## 2015-08-27 LAB — PLEASE NOTE:

## 2015-08-27 NOTE — Progress Notes (Signed)
+++++++++++++++++++++++++++++++++++++++++++++++++++++++++++++++++    BP 136/81 mmHg  Pulse 96  Temp(Src) 98 F (36.7 C)  Ht 5' 3.1" (1.603 m)  Wt 203 lb 9.6 oz (92.352 kg)  BMI 35.94 kg/m2  SpO2 93%   Subjective:    Patient ID: Alyssa Mayo, female    DOB: February 09, 1946, 70 y.o.   MRN: [id number]  HPI: Alyssa Mayo is a 70 y.o. female  Chief Complaint  Patient presents with  . URI    pt states she has nasal congestion, body aches, runny nose, and a cough. States symptoms started Saturday night.   URI  This is a new problem. The problem has been gradually worsening. There has been no fever. Associated symptoms include congestion, coughing, headaches, sinus pain and a sore throat. Associated symptoms comments: myalgias.    Relevant past medical, surgical, family and social history reviewed and updated as indicated. Interim medical history since our last visit reviewed. Allergies and medications reviewed and updated.  Review of Systems  HENT: Positive for congestion and sore throat.   Respiratory: Positive for cough.   Neurological: Positive for headaches.    Per HPI unless specifically indicated above     Objective:    BP 136/81 mmHg  Pulse 96  Temp(Src) 98 F (36.7 C)  Ht 5' 3.1" (1.603 m)  Wt 203 lb 9.6 oz (92.352 kg)  BMI 35.94 kg/m2  SpO2 93%  Wt Readings from Last 3 Encounters:  08/27/15 203 lb 9.6 oz (92.352 kg)  08/07/15 206 lb (93.441 kg)  07/24/15 204 lb (92.534 kg)    Physical Exam  Constitutional: She is oriented to person, place, and time. She appears well-developed and well-nourished. No distress.  HENT:  Head: Normocephalic and atraumatic.  Right Ear: Tympanic membrane and ear canal normal.  Left Ear: Tympanic membrane and ear canal normal.  Nose: Rhinorrhea present. Right sinus exhibits no maxillary sinus tenderness and no frontal sinus tenderness. Left sinus exhibits no maxillary sinus tenderness and no frontal sinus tenderness.   Mouth/Throat: Mucous membranes are normal. Posterior oropharyngeal erythema present.  Eyes: Conjunctivae and lids are normal. Right eye exhibits no discharge. Left eye exhibits no discharge. No scleral icterus.  Cardiovascular: Normal rate and regular rhythm.   Pulmonary/Chest: Effort normal and breath sounds normal. No respiratory distress.  Abdominal: Normal appearance. There is no splenomegaly or hepatomegaly.  Musculoskeletal: Normal range of motion.  Neurological: She is alert and oriented to person, place, and time.  Skin: Skin is intact. No rash noted. No pallor.  Psychiatric: She has a normal mood and affect. Her behavior is normal. Judgment and thought content normal.       Assessment & Plan:   Problem List Items Addressed This Visit    None    Visit Diagnoses    Body aches    -  Primary    Relevant Orders    Influenza a and b    URI (upper respiratory infection)            Follow up plan: Return if symptoms worsen or fail to improve.

## 2015-08-27 NOTE — Patient Instructions (Signed)
Upper Respiratory Infection, Adult Most upper respiratory infections (URIs) are a viral infection of the air passages leading to the lungs. A URI affects the nose, throat, and upper air passages. The most common type of URI is nasopharyngitis and is typically referred to as "the common cold." URIs run their course and usually go away on their own. Most of the time, a URI does not require medical attention, but sometimes a bacterial infection in the upper airways can follow a viral infection. This is called a secondary infection. Sinus and middle ear infections are common types of secondary upper respiratory infections. Bacterial pneumonia can also complicate a URI. A URI can worsen asthma and chronic obstructive pulmonary disease (COPD). Sometimes, these complications can require emergency medical care and may be life threatening.  CAUSES Almost all URIs are caused by viruses. A virus is a type of germ and can spread from one person to another.  RISKS FACTORS You may be at risk for a URI if:   You smoke.   You have chronic heart or lung disease.  You have a weakened defense (immune) system.   You are very young or very old.   You have nasal allergies or asthma.  You work in crowded or poorly ventilated areas.  You work in health care facilities or schools. SIGNS AND SYMPTOMS  Symptoms typically develop 2-3 days after you come in contact with a cold virus. Most viral URIs last 7-10 days. However, viral URIs from the influenza virus (flu virus) can last 14-18 days and are typically more severe. Symptoms may include:   Runny or stuffy (congested) nose.   Sneezing.   Cough.   Sore throat.   Headache.   Fatigue.   Fever.   Loss of appetite.   Pain in your forehead, behind your eyes, and over your cheekbones (sinus pain).  Muscle aches.  DIAGNOSIS  Your health care provider may diagnose a URI by:  Physical exam.  Tests to check that your symptoms are not due to  another condition such as:  Strep throat.  Sinusitis.  Pneumonia.  Asthma. TREATMENT  A URI goes away on its own with time. It cannot be cured with medicines, but medicines may be prescribed or recommended to relieve symptoms. Medicines may help:  Reduce your fever.  Reduce your cough.  Relieve nasal congestion. HOME CARE INSTRUCTIONS   Take medicines only as directed by your health care provider.   Gargle warm saltwater or take cough drops to comfort your throat as directed by your health care provider.  Use a warm mist humidifier or inhale steam from a shower to increase air moisture. This may make it easier to breathe.  Drink enough fluid to keep your urine clear or pale yellow.   Eat soups and other clear broths and maintain good nutrition.   Rest as needed.   Return to work when your temperature has returned to normal or as your health care provider advises. You may need to stay home longer to avoid infecting others. You can also use a face mask and careful hand washing to prevent spread of the virus.  Increase the usage of your inhaler if you have asthma.   Do not use any tobacco products, including cigarettes, chewing tobacco, or electronic cigarettes. If you need help quitting, ask your health care provider. PREVENTION  The best way to protect yourself from getting a cold is to practice good hygiene.   Avoid oral or hand contact with people with cold   symptoms.   Wash your hands often if contact occurs.  There is no clear evidence that vitamin C, vitamin E, echinacea, or exercise reduces the chance of developing a cold. However, it is always recommended to get plenty of rest, exercise, and practice good nutrition.  SEEK MEDICAL CARE IF:   You are getting worse rather than better.   Your symptoms are not controlled by medicine.   You have chills.  You have worsening shortness of breath.  You have brown or red mucus.  You have yellow or brown nasal  discharge.  You have pain in your face, especially when you bend forward.  You have a fever.  You have swollen neck glands.  You have pain while swallowing.  You have white areas in the back of your throat. SEEK IMMEDIATE MEDICAL CARE IF:   You have severe or persistent:  Headache.  Ear pain.  Sinus pain.  Chest pain.  You have chronic lung disease and any of the following:  Wheezing.  Prolonged cough.  Coughing up blood.  A change in your usual mucus.  You have a stiff neck.  You have changes in your:  Vision.  Hearing.  Thinking.  Mood. MAKE SURE YOU:   Understand these instructions.  Will watch your condition.  Will get help right away if you are not doing well or get worse.   This information is not intended to replace advice given to you by your health care provider. Make sure you discuss any questions you have with your health care provider.   Document Released: 12/31/2000 Document Revised: 11/21/2014 Document Reviewed: 10/12/2013 Elsevier Interactive Patient Education 2016 [employer]  

## 2015-08-28 NOTE — Telephone Encounter (Signed)
Alyssa Mayo, Please see note below from Jan 16th Thank you

## 2015-09-02 NOTE — Telephone Encounter (Signed)
Laurelyn Sickle, Please see notes below Thank you

## 2015-09-03 NOTE — Telephone Encounter (Signed)
Patient's appt was scheduled for 08/22/15.  Will call to verify that patient went to appt.

## 2015-09-04 DIAGNOSIS — R945 Abnormal results of liver function studies: Secondary | ICD-10-CM | POA: Diagnosis not present

## 2015-09-04 DIAGNOSIS — R799 Abnormal finding of blood chemistry, unspecified: Secondary | ICD-10-CM | POA: Diagnosis not present

## 2015-09-06 ENCOUNTER — Ambulatory Visit (INDEPENDENT_AMBULATORY_CARE_PROVIDER_SITE_OTHER): Payer: Medicare HMO | Admitting: Family Medicine

## 2015-09-06 ENCOUNTER — Encounter: Payer: Self-pay | Admitting: Family Medicine

## 2015-09-06 VITALS — BP 103/67 | HR 94 | Temp 98.0°F | Ht 63.0 in | Wt 204.0 lb

## 2015-09-06 DIAGNOSIS — I10 Essential (primary) hypertension: Secondary | ICD-10-CM | POA: Diagnosis not present

## 2015-09-06 DIAGNOSIS — E119 Type 2 diabetes mellitus without complications: Secondary | ICD-10-CM | POA: Diagnosis not present

## 2015-09-06 LAB — BAYER DCA HB A1C WAIVED: HB A1C: 7.9 % — AB (ref <7.0)

## 2015-09-06 MED ORDER — CELECOXIB 200 MG PO CAPS: ORAL_CAPSULE | ORAL | Status: DC

## 2015-09-06 MED ORDER — SITAGLIPTIN PHOSPHATE 100 MG PO TABS
100.0000 mg | ORAL_TABLET | Freq: Every day | ORAL | Status: DC
Start: 2015-09-06 — End: 2015-12-10

## 2015-09-06 MED ORDER — SIMVASTATIN 40 MG PO TABS: 40.0000 mg | ORAL_TABLET | Freq: Every day | ORAL | Status: DC

## 2015-09-06 NOTE — Assessment & Plan Note (Signed)
Discuss poor control and compliance issues Discuss linking behaviors Discussed diet exercise weight loss Will start Januvia We will continue other medications

## 2015-09-06 NOTE — Progress Notes (Signed)
   BP 103/67 mmHg  Pulse 94  Temp(Src) 98 F (36.7 C)  Ht 5\' 3"  (1.6 m)  Wt 204 lb (92.534 kg)  BMI 36.15 kg/m2  SpO2 96%   Subjective:    Patient ID: Alyssa Mayo, female    DOB: [birth year], 70 y.o.   MRN: [id number]  HPI: Alyssa Mayo is a 70 y.o. female  Chief Complaint  Patient presents with  . Diabetes  . Hypertension   patient follow-up has struggled with diet and and taking medications twice a day having trouble remembering morning pills maybe 2-3 times a week will forget morning medications. Also on December  with not taking metformin because of liver or concerns. Patient still having liver workup which was reviewed and considerations of biopsies Blood pressure doing well Reflux stable Cholesterol stable and liver doctors have said stay on simvastatin  Relevant past medical, surgical, family and social history reviewed and updated as indicated. Interim medical history since our last visit reviewed. Allergies and medications reviewed and updated.  Review of Systems  Constitutional: Negative.   Respiratory: Negative.   Cardiovascular: Negative.   Gastrointestinal: Negative for abdominal pain.    Per HPI unless specifically indicated above     Objective:    BP 103/67 mmHg  Pulse 94  Temp(Src) 98 F (36.7 C)  Ht 5\' 3"  (1.6 m)  Wt 204 lb (92.534 kg)  BMI 36.15 kg/m2  SpO2 96%  Wt Readings from Last 3 Encounters:  09/06/15 204 lb (92.534 kg)  08/27/15 203 lb 9.6 oz (92.352 kg)  08/07/15 206 lb (93.441 kg)    Physical Exam  Constitutional: She is oriented to person, place, and time. She appears well-developed and well-nourished. No distress.  HENT:  Head: Normocephalic and atraumatic.  Right Ear: Hearing normal.  Left Ear: Hearing normal.  Nose: Nose normal.  Eyes: Conjunctivae and lids are normal. Right eye exhibits no discharge. Left eye exhibits no discharge. No scleral icterus.  Cardiovascular: Normal rate, regular rhythm and normal heart  sounds.   Pulmonary/Chest: Effort normal and breath sounds normal. No respiratory distress.  Musculoskeletal: Normal range of motion.  Neurological: She is alert and oriented to person, place, and time.  Skin: Skin is intact. No rash noted.  Psychiatric: She has a normal mood and affect. Her speech is normal and behavior is normal. Judgment and thought content normal. Cognition and memory are normal.        Assessment & Plan:   Problem List Items Addressed This Visit      Cardiovascular and Mediastinum   Essential hypertension    The current medical regimen is effective;  continue present plan and medications.       Relevant Medications   simvastatin (ZOCOR) 40 MG tablet     Endocrine   Diabetes mellitus without complication (HCC) - Primary    Discuss poor control and compliance issues Discuss linking behaviors Discussed diet exercise weight loss Will start Januvia We will continue other medications      Relevant Medications   sitaGLIPtin (JANUVIA) 100 MG tablet   simvastatin (ZOCOR) 40 MG tablet   Other Relevant Orders   Basic metabolic panel   Bayer DCA Hb P5K Waived       Follow up plan: Return in about 3 months (around 12/04/2015) for Physical Exam and A1c.

## 2015-09-06 NOTE — Assessment & Plan Note (Signed)
The current medical regimen is effective;  continue present plan and medications.  

## 2015-09-07 LAB — BASIC METABOLIC PANEL
BUN / CREAT RATIO: 28 — AB (ref 11–26)
BUN: 24 mg/dL (ref 8–27)
CO2: 21 mmol/L (ref 18–29)
Calcium: 9.8 mg/dL (ref 8.7–10.3)
Chloride: 101 mmol/L (ref 96–106)
Creatinine, Ser: 0.85 mg/dL (ref 0.57–1.00)
GFR, EST AFRICAN AMERICAN: 80 mL/min/{1.73_m2} (ref >59)
GFR, EST NON AFRICAN AMERICAN: 70 mL/min/{1.73_m2} (ref >59)
Glucose: 146 mg/dL — ABNORMAL HIGH (ref 65–99)
POTASSIUM: 4.6 mmol/L (ref 3.5–5.2)
SODIUM: 142 mmol/L (ref 134–144)

## 2015-09-08 ENCOUNTER — Encounter: Payer: Self-pay | Admitting: Family Medicine

## 2015-09-09 NOTE — Telephone Encounter (Signed)
I searched Care Everywhere She saw Dr. Marva Panda; note reviewed; thank you

## 2015-09-17 ENCOUNTER — Telehealth: Payer: Self-pay

## 2015-09-17 DIAGNOSIS — G4733 Obstructive sleep apnea (adult) (pediatric): Secondary | ICD-10-CM | POA: Diagnosis not present

## 2015-09-17 DIAGNOSIS — R945 Abnormal results of liver function studies: Secondary | ICD-10-CM | POA: Diagnosis not present

## 2015-09-17 DIAGNOSIS — E8801 Alpha-1-antitrypsin deficiency: Secondary | ICD-10-CM | POA: Diagnosis not present

## 2015-09-17 NOTE — Telephone Encounter (Signed)
She would like to discuss patient with you

## 2015-09-20 NOTE — Telephone Encounter (Signed)
Call pt 

## 2015-09-26 ENCOUNTER — Encounter: Payer: Self-pay | Admitting: Family Medicine

## 2015-09-26 DIAGNOSIS — E559 Vitamin D deficiency, unspecified: Secondary | ICD-10-CM | POA: Insufficient documentation

## 2015-10-19 ENCOUNTER — Other Ambulatory Visit: Payer: Self-pay | Admitting: Family Medicine

## 2015-10-19 DIAGNOSIS — E119 Type 2 diabetes mellitus without complications: Secondary | ICD-10-CM | POA: Diagnosis not present

## 2015-10-19 LAB — HM DIABETES EYE EXAM

## 2015-11-03 ENCOUNTER — Ambulatory Visit
Admission: EM | Admit: 2015-11-03 | Discharge: 2015-11-03 | Disposition: A | Discharge status: Home / Self Care | Payer: Medicare HMO | Attending: Family Medicine | Admitting: Family Medicine

## 2015-11-03 DIAGNOSIS — B349 Viral infection, unspecified: Secondary | ICD-10-CM

## 2015-11-03 LAB — RAPID INFLUENZA A&B ANTIGENS (ARMC ONLY): INFLUENZA B (ARMC): NEGATIVE

## 2015-11-03 LAB — RAPID INFLUENZA A&B ANTIGENS: Influenza A (ARMC): NEGATIVE

## 2015-11-03 MED ORDER — OSELTAMIVIR PHOSPHATE 75 MG PO CAPS: 75.0000 mg | ORAL_CAPSULE | Freq: Two times a day (BID) | ORAL | Status: DC

## 2015-11-03 NOTE — ED Provider Notes (Signed)
CSN: [id number]     Arrival date & time 11/03/15  1154 History   First MD Initiated Contact with Patient 11/03/15 1326     Chief Complaint  Patient presents with  . URI    Reports body aches, sore throat and fever starting yesterday. Had similar last weekend but went away. Pain 4/10   (Consider location/radiation/quality/duration/timing/severity/associated sxs/prior Treatment) HPI: Patient presents today with symptoms of fever this morning which she measured to be 100. She also reports myalgias, clear nasal drainage with sore throat. She admits to minimal cough. She denies any headache, chest pain, shortness of breath, nausea, vomiting, diarrhea.   Past Medical History  Diagnosis Date  . Fatty liver   . Sleep apnea   . Arthritis   . Elevated liver enzymes   . Diabetes mellitus without complication (HCC)   . Lifelong obesity   . Hyperlipidemia    Past Surgical History  Procedure Laterality Date  . Cervical laminectomy  1987  . Appendectomy    . Cesarean section    . Abdominal hysterectomy    . Tubal ligation    . Colonoscopy  2014    diverticulosis, [facility] Dr. [name]   . Spine surgery     Family History  Problem Relation Age of Onset  . Cancer Mother     breast  . Heart disease Mother   . Stroke Mother   . Diabetes Mother   . Cancer Father     leukemia  . Stroke Father   . Cancer Brother   . COPD Neg Hx   . Hypertension Neg Hx    Social History  Substance Use Topics  . Smoking status: Never Smoker   . Smokeless tobacco: Never Used  . Alcohol Use: 0.0 oz/week    0 Standard drinks or equivalent per week     Comment: rare   OB History    No data available     Review of Systems: Negative except mentioned above.  Allergies  Sulfa antibiotics  Home Medications   Prior to Admission medications   Medication Sig Start Date End Date Taking? Authorizing Provider  aspirin EC 81 MG tablet Take 1 tablet (81 mg total) by mouth daily. Take at least one hour BEFORE the  Celebrex 07/11/15  Yes Kerman Passey, MD  benazepril (LOTENSIN) 20 MG tablet Take 1 tablet (20 mg total) by mouth daily. 06/04/15  Yes Steele Sizer, MD  celecoxib (CELEBREX) 200 MG capsule Take 1 capsule (200 mg total) by mouth daily. 09/06/15  Yes Steele Sizer, MD  glucose blood (ONE TOUCH ULTRA TEST) test strip  08/07/15  Yes Historical Provider, MD  INVOKANA 300 MG TABS tablet TAKE 1 TABLET DAILY. 10/21/15  Yes Steele Sizer, MD  metFORMIN (GLUCOPHAGE) 1000 MG tablet Take 1,000 mg by mouth 2 (two) times daily with a meal.   Yes Historical Provider, MD  pantoprazole (PROTONIX) 40 MG tablet Take 40 mg by mouth.   Yes Historical Provider, MD  simvastatin (ZOCOR) 40 MG tablet Take 1 tablet (40 mg total) by mouth daily. 09/06/15  Yes Steele Sizer, MD  sitaGLIPtin (JANUVIA) 100 MG tablet Take 1 tablet (100 mg total) by mouth daily. 09/06/15  Yes Steele Sizer, MD  oseltamivir (TAMIFLU) 75 MG capsule Take 1 capsule (75 mg total) by mouth every 12 (twelve) hours. 11/03/15   Jolene Provost, MD   Meds Ordered and Administered this Visit  Medications - No data to display  BP 103/55 mmHg  Pulse 91  Temp(Src) 97.7 F (36.5 C) (Oral)  Resp 20  Ht 5' 3.5" (1.613 m)  Wt 206 lb (93.441 kg)  BMI 35.91 kg/m2  SpO2 96% No data found.   Physical Exam   GENERAL: NAD HEENT: mild pharyngeal erythema, no exudate, no erythema of TMs, no cervical LAD RESP: CTA B CARD: RRR NEURO: CN II-XII grossly intact   ED Course  Procedures (including critical care time)  Labs Review Labs Reviewed  RAPID INFLUENZA A&B ANTIGENS ([facility] ONLY)    Imaging Review No results found.    MDM   1. Viral illness   Discussed with patient that her symptoms are likely related to viral etiology. Patient can take Tylenol/Motrin when necessary, rest, hydration, Tamiflu prescribed given symptoms despite negative influenza screen in the office. Patient can decide if she wants to take this or not. Delsym when necessary,  Claritin when necessary. Seek medical attention if symptoms persist or worsen as discussed.   Jolene Provost, MD 11/03/15 [phone]

## 2015-12-10 ENCOUNTER — Encounter: Payer: Self-pay | Admitting: Family Medicine

## 2015-12-10 ENCOUNTER — Ambulatory Visit (INDEPENDENT_AMBULATORY_CARE_PROVIDER_SITE_OTHER): Payer: Medicare HMO | Admitting: Family Medicine

## 2015-12-10 VITALS — BP 129/78 | HR 87 | Temp 98.2°F | Ht 63.3 in | Wt 213.0 lb

## 2015-12-10 DIAGNOSIS — E785 Hyperlipidemia, unspecified: Secondary | ICD-10-CM

## 2015-12-10 DIAGNOSIS — Z0000 Encounter for general adult medical examination without abnormal findings: Secondary | ICD-10-CM | POA: Diagnosis not present

## 2015-12-10 DIAGNOSIS — Z1231 Encounter for screening mammogram for malignant neoplasm of breast: Secondary | ICD-10-CM

## 2015-12-10 DIAGNOSIS — E119 Type 2 diabetes mellitus without complications: Secondary | ICD-10-CM

## 2015-12-10 DIAGNOSIS — I10 Essential (primary) hypertension: Secondary | ICD-10-CM | POA: Diagnosis not present

## 2015-12-10 LAB — URINALYSIS, ROUTINE W REFLEX MICROSCOPIC
Bilirubin, UA: NEGATIVE
Ketones, UA: NEGATIVE
LEUKOCYTES UA: NEGATIVE
Nitrite, UA: NEGATIVE
PROTEIN UA: NEGATIVE
RBC, UA: NEGATIVE
Specific Gravity, UA: 1.01 (ref 1.005–1.030)
UUROB: 0.2 mg/dL (ref 0.2–1.0)
pH, UA: 5 (ref 5.0–7.5)

## 2015-12-10 LAB — BAYER DCA HB A1C WAIVED: HB A1C (BAYER DCA - WAIVED): 7.5 % — ABNORMAL HIGH (ref <7.0)

## 2015-12-10 MED ORDER — CELECOXIB 200 MG PO CAPS: ORAL_CAPSULE | ORAL | Status: DC

## 2015-12-10 MED ORDER — DULAGLUTIDE 0.75 MG/0.5ML ~~LOC~~ SOAJ: 0.7500 mg | SUBCUTANEOUS | Status: DC

## 2015-12-10 MED ORDER — CANAGLIFLOZIN 300 MG PO TABS: 300.0000 mg | ORAL_TABLET | Freq: Every day | ORAL | Status: DC

## 2015-12-10 MED ORDER — PANTOPRAZOLE SODIUM 40 MG PO TBEC: 40.0000 mg | DELAYED_RELEASE_TABLET | Freq: Every day | ORAL | Status: DC

## 2015-12-10 MED ORDER — SITAGLIPTIN PHOSPHATE 100 MG PO TABS: 100.0000 mg | ORAL_TABLET | Freq: Every day | ORAL | Status: DC

## 2015-12-10 MED ORDER — METFORMIN HCL 500 MG PO TABS
1000.0000 mg | ORAL_TABLET | Freq: Two times a day (BID) | ORAL | Status: DC
Start: 2015-12-10 — End: 2016-12-17

## 2015-12-10 MED ORDER — SIMVASTATIN 40 MG PO TABS: 40.0000 mg | ORAL_TABLET | Freq: Every day | ORAL | Status: DC

## 2015-12-10 MED ORDER — BENAZEPRIL HCL 10 MG PO TABS: 10.0000 mg | ORAL_TABLET | Freq: Every day | ORAL | Status: DC

## 2015-12-10 NOTE — Assessment & Plan Note (Signed)
Due to continued poor control of diabetes will add Trulicity 0.75 once a week  first injection here in the office today.

## 2015-12-10 NOTE — Progress Notes (Signed)
BP 129/78 mmHg  Pulse 87  Temp(Src) 98.2 F (36.8 C)  Ht 5' 3.3" (1.608 m)  Wt 213 lb (96.616 kg)  BMI 37.37 kg/m2  SpO2 95%   Subjective:    Patient ID: Alyssa Mayo, female    DOB: 23-Nov-1945, 70 y.o.   MRN: [id number]  HPI: Alyssa Mayo is a 70 y.o. female  Chief Complaint  Patient presents with  . Annual Exam  . Diabetes  . Patient is due Td  On review patient got T Dap 4 years ago Struggling with weight blood sugars 140s to 160s No issues with medications except stop Benzapril 20 mg because blood pressure was getting too low.  AWV metrics met Relevant past medical, surgical, family and social history reviewed and updated as indicated. Interim medical history since our last visit reviewed. Allergies and medications reviewed and updated.  Review of Systems  Constitutional: Negative.   HENT: Negative.   Eyes: Negative.   Respiratory: Negative.   Cardiovascular: Negative.   Gastrointestinal: Negative.   Endocrine: Negative.   Genitourinary: Negative.   Musculoskeletal: Negative.   Skin: Negative.   Allergic/Immunologic: Negative.   Neurological: Negative.   Hematological: Negative.   Psychiatric/Behavioral: Negative.     Per HPI unless specifically indicated above     Objective:    BP 129/78 mmHg  Pulse 87  Temp(Src) 98.2 F (36.8 C)  Ht 5' 3.3" (1.608 m)  Wt 213 lb (96.616 kg)  BMI 37.37 kg/m2  SpO2 95%  Wt Readings from Last 3 Encounters:  12/10/15 213 lb (96.616 kg)  11/03/15 206 lb (93.441 kg)  09/06/15 204 lb (92.534 kg)    Physical Exam  Constitutional: She is oriented to person, place, and time. She appears well-developed and well-nourished.  HENT:  Head: Normocephalic and atraumatic.  Right Ear: External ear normal.  Left Ear: External ear normal.  Nose: Nose normal.  Mouth/Throat: Oropharynx is clear and moist.  Eyes: Conjunctivae and EOM are normal. Pupils are equal, round, and reactive to light.  Neck: Normal range of  motion. Neck supple. Carotid bruit is not present.  Cardiovascular: Normal rate, regular rhythm and normal heart sounds.   No murmur heard. Pulmonary/Chest: Effort normal and breath sounds normal. She exhibits no mass. Right breast exhibits no mass, no skin change and no tenderness. Left breast exhibits no mass, no skin change and no tenderness. Breasts are symmetrical.  Abdominal: Soft. Bowel sounds are normal. There is no hepatosplenomegaly.  Musculoskeletal: Normal range of motion.  Neurological: She is alert and oriented to person, place, and time.  Skin: No rash noted.  Psychiatric: She has a normal mood and affect. Her behavior is normal. Judgment and thought content normal.        Assessment & Plan:   Problem List Items Addressed This Visit      Cardiovascular and Mediastinum   Essential hypertension    Low blood pressure on medications but will continue medications at lower dose for renal protection with diabetes will try 10 mg May need to decrease to 5      Relevant Medications   benazepril (LOTENSIN) 10 MG tablet   simvastatin (ZOCOR) 40 MG tablet   Other Relevant Orders   CBC with Differential/Platelet   TSH   Urinalysis, Routine w reflex microscopic (not at [facility])     Endocrine   Diabetes mellitus without complication ([place])    Due to continued poor control of diabetes will add Trulicity 1.02 once a week  first  injection here in the office today.      Relevant Medications   benazepril (LOTENSIN) 10 MG tablet   canagliflozin (INVOKANA) 300 MG TABS tablet   metFORMIN (GLUCOPHAGE) 500 MG tablet   simvastatin (ZOCOR) 40 MG tablet   sitaGLIPtin (JANUVIA) 100 MG tablet   Dulaglutide (TRULICITY) 0.75 MG/0.5ML SOPN   Other Relevant Orders   CBC with Differential/Platelet   TSH   Urinalysis, Routine w reflex microscopic (not at [facility])   Bayer DCA Hb A1c Waived     Other   Hyperlipidemia    The current medical regimen is effective;  continue present plan and  medications.       Relevant Medications   benazepril (LOTENSIN) 10 MG tablet   simvastatin (ZOCOR) 40 MG tablet   Other Relevant Orders   Lipid panel   CBC with Differential/Platelet   TSH   Urinalysis, Routine w reflex microscopic (not at [facility])    Other Visit Diagnoses    Encounter for screening mammogram for breast cancer    -  Primary    PE (physical exam), annual            Follow up plan: Return in about 3 months (around 03/11/2016) for a1c.       

## 2015-12-10 NOTE — Assessment & Plan Note (Signed)
Low blood pressure on medications but will continue medications at lower dose for renal protection with diabetes will try 10 mg May need to decrease to 5

## 2015-12-10 NOTE — Assessment & Plan Note (Signed)
The current medical regimen is effective;  continue present plan and medications.  

## 2015-12-11 ENCOUNTER — Encounter: Payer: Self-pay | Admitting: Family Medicine

## 2015-12-11 LAB — LIPID PANEL
CHOL/HDL RATIO: 3.5 ratio (ref 0.0–4.4)
Cholesterol, Total: 153 mg/dL (ref 100–199)
HDL: 44 mg/dL (ref >39)
LDL Calculated: 51 mg/dL (ref 0–99)
TRIGLYCERIDES: 290 mg/dL — AB (ref 0–149)
VLDL Cholesterol Cal: 58 mg/dL — ABNORMAL HIGH (ref 5–40)

## 2015-12-11 LAB — CBC WITH DIFFERENTIAL/PLATELET
BASOS ABS: 0 10*3/uL (ref 0.0–0.2)
Basos: 0 %
EOS (ABSOLUTE): 0.2 10*3/uL (ref 0.0–0.4)
EOS: 2 %
HEMATOCRIT: 39.3 % (ref 34.0–46.6)
HEMOGLOBIN: 12.7 g/dL (ref 11.1–15.9)
Immature Grans (Abs): 0 10*3/uL (ref 0.0–0.1)
Immature Granulocytes: 0 %
LYMPHS ABS: 1.9 10*3/uL (ref 0.7–3.1)
Lymphs: 20 %
MCH: 28 pg (ref 26.6–33.0)
MCHC: 32.3 g/dL (ref 31.5–35.7)
MCV: 87 fL (ref 79–97)
MONOCYTES: 6 %
Monocytes Absolute: 0.6 10*3/uL (ref 0.1–0.9)
NEUTROS ABS: 6.7 10*3/uL (ref 1.4–7.0)
Neutrophils: 72 %
Platelets: 287 10*3/uL (ref 150–379)
RBC: 4.54 x10E6/uL (ref 3.77–5.28)
RDW: 14.5 % (ref 12.3–15.4)
WBC: 9.4 10*3/uL (ref 3.4–10.8)

## 2015-12-11 LAB — TSH: TSH: 0.935 u[IU]/mL (ref 0.450–4.500)

## 2015-12-12 ENCOUNTER — Other Ambulatory Visit: Payer: Self-pay | Admitting: Gastroenterology

## 2015-12-12 DIAGNOSIS — E8801 Alpha-1-antitrypsin deficiency: Secondary | ICD-10-CM | POA: Diagnosis not present

## 2015-12-12 DIAGNOSIS — R945 Abnormal results of liver function studies: Secondary | ICD-10-CM | POA: Diagnosis not present

## 2015-12-18 ENCOUNTER — Ambulatory Visit
Admission: RE | Admit: 2015-12-18 | Discharge: 2015-12-18 | Disposition: A | Discharge status: Home / Self Care | Payer: Medicare HMO | Source: Ambulatory Visit | Attending: Gastroenterology | Admitting: Gastroenterology

## 2015-12-18 DIAGNOSIS — N281 Cyst of kidney, acquired: Secondary | ICD-10-CM | POA: Diagnosis not present

## 2015-12-18 DIAGNOSIS — E8801 Alpha-1-antitrypsin deficiency: Secondary | ICD-10-CM | POA: Insufficient documentation

## 2015-12-18 DIAGNOSIS — K760 Fatty (change of) liver, not elsewhere classified: Secondary | ICD-10-CM | POA: Diagnosis not present

## 2015-12-18 DIAGNOSIS — R945 Abnormal results of liver function studies: Secondary | ICD-10-CM | POA: Insufficient documentation

## 2015-12-18 DIAGNOSIS — I77811 Abdominal aortic ectasia: Secondary | ICD-10-CM | POA: Diagnosis not present

## 2015-12-20 DIAGNOSIS — Z1231 Encounter for screening mammogram for malignant neoplasm of breast: Secondary | ICD-10-CM | POA: Diagnosis not present

## 2015-12-24 ENCOUNTER — Telehealth: Payer: Self-pay | Admitting: Family Medicine

## 2015-12-24 NOTE — Telephone Encounter (Signed)
Phone call Discussed with patient mammogram follow-up hasn't been ordered will do so Patient also standby cost of her insulin wants to have one more shot at diet exercise nutrition. Patient will not use of insulin for now.

## 2015-12-27 DIAGNOSIS — R69 Illness, unspecified: Secondary | ICD-10-CM | POA: Diagnosis not present

## 2016-01-01 ENCOUNTER — Encounter: Payer: Self-pay | Admitting: Family Medicine

## 2016-01-01 DIAGNOSIS — R928 Other abnormal and inconclusive findings on diagnostic imaging of breast: Secondary | ICD-10-CM | POA: Diagnosis not present

## 2016-03-04 DIAGNOSIS — R69 Illness, unspecified: Secondary | ICD-10-CM | POA: Diagnosis not present

## 2016-03-10 ENCOUNTER — Other Ambulatory Visit: Payer: Self-pay

## 2016-03-10 ENCOUNTER — Encounter: Payer: Self-pay | Admitting: Family Medicine

## 2016-03-10 ENCOUNTER — Ambulatory Visit (INDEPENDENT_AMBULATORY_CARE_PROVIDER_SITE_OTHER): Payer: Medicare HMO | Admitting: Family Medicine

## 2016-03-10 VITALS — BP 134/81 | HR 97 | Temp 98.0°F | Ht 63.8 in | Wt 212.0 lb

## 2016-03-10 DIAGNOSIS — E119 Type 2 diabetes mellitus without complications: Secondary | ICD-10-CM

## 2016-03-10 DIAGNOSIS — E785 Hyperlipidemia, unspecified: Secondary | ICD-10-CM

## 2016-03-10 DIAGNOSIS — I10 Essential (primary) hypertension: Secondary | ICD-10-CM | POA: Diagnosis not present

## 2016-03-10 LAB — BAYER DCA HB A1C WAIVED: HB A1C: 7.6 % — AB (ref <7.0)

## 2016-03-10 LAB — HEMOGLOBIN A1C: HEMOGLOBIN A1C: 7.6

## 2016-03-10 NOTE — Progress Notes (Signed)
BP 134/81 (BP Location: Left Arm, Patient Position: Sitting, Cuff Size: Normal)   Pulse 97   Temp 98 F (36.7 C)   Ht 5' 3.8" (1.621 m)   Wt 212 lb (96.2 kg)   SpO2 98%   BMI 36.62 kg/m    Subjective:    Patient ID: Alyssa Mayo, female    DOB: 07/07/1946, 70 y.o.   MRN: [id number]  HPI: Alyssa Mayo is a 70 y.o. female  Chief Complaint  Patient presents with  . Diabetes   Recheck diabetes has been working at diet and is quit gaining weight but not losing weight. Blood sugars may be somewhat better has not taking Trulicity because of too expensive has not taken Benzapril as blood pressure gets too low even on half a pill. So patient was stopped and blood pressures are doing well. Cholesterol no complaints Reflux stable No low blood sugar spells. Relevant past medical, surgical, family and social history reviewed and updated as indicated. Interim medical history since our last visit reviewed. Allergies and medications reviewed and updated.  Review of Systems  Constitutional: Negative.   Respiratory: Negative.   Cardiovascular: Negative.     Per HPI unless specifically indicated above     Objective:    BP 134/81 (BP Location: Left Arm, Patient Position: Sitting, Cuff Size: Normal)   Pulse 97   Temp 98 F (36.7 C)   Ht 5' 3.8" (1.621 m)   Wt 212 lb (96.2 kg)   SpO2 98%   BMI 36.62 kg/m   Wt Readings from Last 3 Encounters:  03/10/16 212 lb (96.2 kg)  12/10/15 213 lb (96.6 kg)  11/03/15 206 lb (93.4 kg)    Physical Exam  Constitutional: She is oriented to person, place, and time. She appears well-developed and well-nourished. No distress.  HENT:  Head: Normocephalic and atraumatic.  Right Ear: Hearing normal.  Left Ear: Hearing normal.  Nose: Nose normal.  Eyes: Conjunctivae and lids are normal. Right eye exhibits no discharge. Left eye exhibits no discharge. No scleral icterus.  Cardiovascular: Normal rate, regular rhythm and normal heart  sounds.   Pulmonary/Chest: Effort normal and breath sounds normal. No respiratory distress.  Musculoskeletal: Normal range of motion.  Neurological: She is alert and oriented to person, place, and time.  Skin: Skin is intact. No rash noted.  Psychiatric: She has a normal mood and affect. Her speech is normal and behavior is normal. Judgment and thought content normal. Cognition and memory are normal.    Results for orders placed or performed in visit on 12/10/15  Lipid panel  Result Value Ref Range   Cholesterol, Total 153 100 - 199 mg/dL   Triglycerides 409290 (H) 0 - 149 mg/dL   HDL 44 >81>39 mg/dL   VLDL Cholesterol Cal 58 (H) 5 - 40 mg/dL   LDL Calculated 51 0 - 99 mg/dL   Chol/HDL Ratio 3.5 0.0 - 4.4 ratio units  CBC with Differential/Platelet  Result Value Ref Range   WBC 9.4 3.4 - 10.8 x10E3/uL   RBC 4.54 3.77 - 5.28 x10E6/uL   Hemoglobin 12.7 11.1 - 15.9 g/dL   Hematocrit 19.139.3 47.834.0 - 46.6 %   MCV 87 79 - 97 fL   MCH 28.0 26.6 - 33.0 pg   MCHC 32.3 31.5 - 35.7 g/dL   RDW 29.514.5 62.112.3 - 30.815.4 %   Platelets 287 150 - 379 x10E3/uL   Neutrophils 72 %   Lymphs 20 %   Monocytes 6 %  Eos 2 %   Basos 0 %   Neutrophils Absolute 6.7 1.4 - 7.0 x10E3/uL   Lymphocytes Absolute 1.9 0.7 - 3.1 x10E3/uL   Monocytes Absolute 0.6 0.1 - 0.9 x10E3/uL   EOS (ABSOLUTE) 0.2 0.0 - 0.4 x10E3/uL   Basophils Absolute 0.0 0.0 - 0.2 x10E3/uL   Immature Granulocytes 0 %   Immature Grans (Abs) 0.0 0.0 - 0.1 x10E3/uL  TSH  Result Value Ref Range   TSH 0.935 0.450 - 4.500 uIU/mL  Urinalysis, Routine w reflex microscopic (not at [facility])  Result Value Ref Range   Specific Gravity, UA 1.010 1.005 - 1.030   pH, UA 5.0 5.0 - 7.5   Color, UA Yellow Yellow   Appearance Ur Clear Clear   Leukocytes, UA Negative Negative   Protein, UA Negative Negative/Trace   Glucose, UA 3+ (A) Negative   Ketones, UA Negative Negative   RBC, UA Negative Negative   Bilirubin, UA Negative Negative   Urobilinogen, Ur 0.2 0.2 -  1.0 mg/dL   Nitrite, UA Negative Negative  Bayer DCA Hb A1c Waived  Result Value Ref Range   Bayer DCA Hb A1c Waived 7.5 (H) <7.0 %      Assessment & Plan:   Problem List Items Addressed This Visit      Cardiovascular and Mediastinum   Essential hypertension    The current medical regimen is effective;  continue present plan and medications.         Endocrine   Diabetes mellitus without complication (HCC) - Primary    The current medical regimen is effective;  continue present plan and medications. Discussed need for better control will do with her style       Relevant Orders   Bayer DCA Hb A1c Waived     Other   Hyperlipidemia    The current medical regimen is effective;  continue present plan and medications.       Morbid obesity (HCC)    Discussed diet exercise nutrition       Other Visit Diagnoses   None.      Follow up plan: Return in about 3 months (around 06/10/2016) for BMP,  Lipids, ALT, AST, Hemoglobin A1c.

## 2016-03-10 NOTE — Assessment & Plan Note (Signed)
The current medical regimen is effective;  continue present plan and medications.  

## 2016-03-10 NOTE — Assessment & Plan Note (Signed)
Discussed diet exercise nutrition 

## 2016-03-10 NOTE — Assessment & Plan Note (Addendum)
The current medical regimen is effective;  continue present plan and medications. Discussed need for better control will do with her style

## 2016-03-17 DIAGNOSIS — G4733 Obstructive sleep apnea (adult) (pediatric): Secondary | ICD-10-CM | POA: Diagnosis not present

## 2016-03-20 IMAGING — US US ABDOMEN COMPLETE
1 series · 14 of 25 positions shown · non-contrast
Comparison: None.

CLINICAL DATA: Elevated liver enzymes.

EXAM:
ABDOMEN ULTRASOUND COMPLETE

[Series 1: us abdomen complete · 0.19mm/px · 14 of 111 slices shown]
[im 1/111]
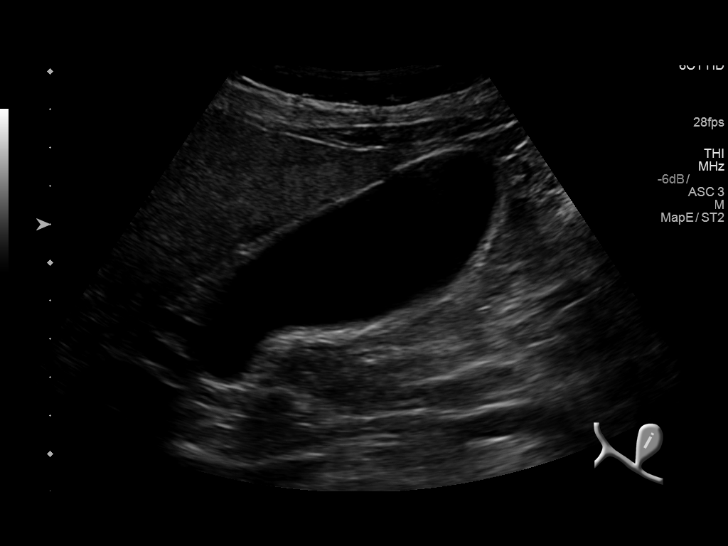
[im 10/111]
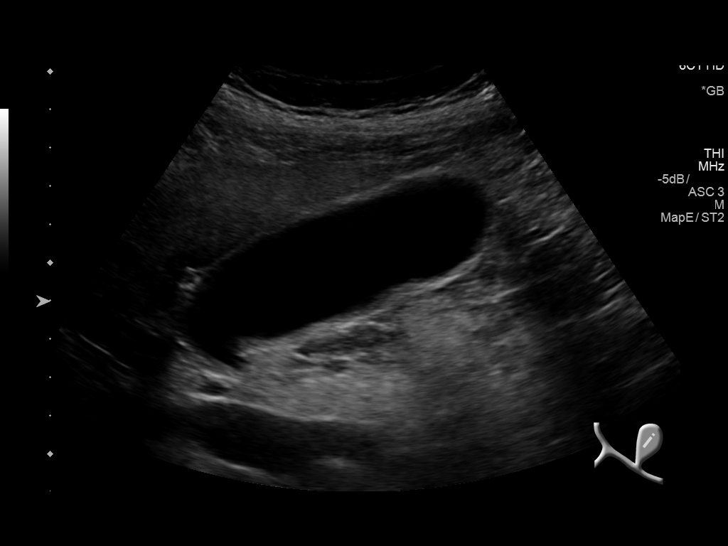
[im 19/111]
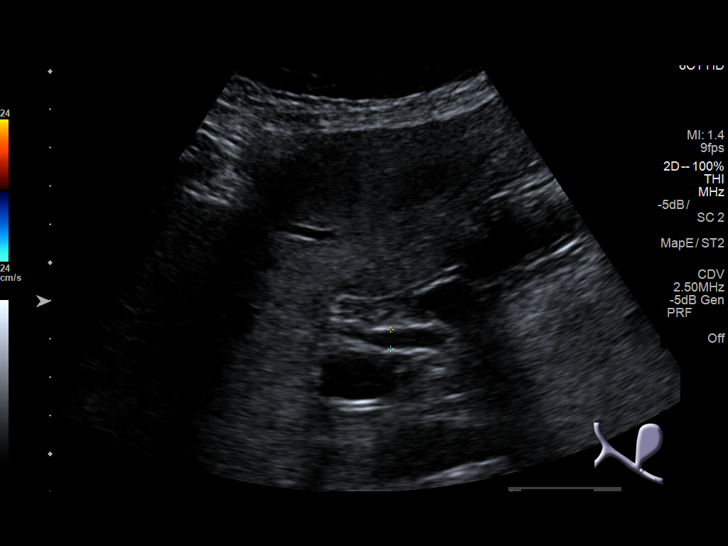
[im 28/111]
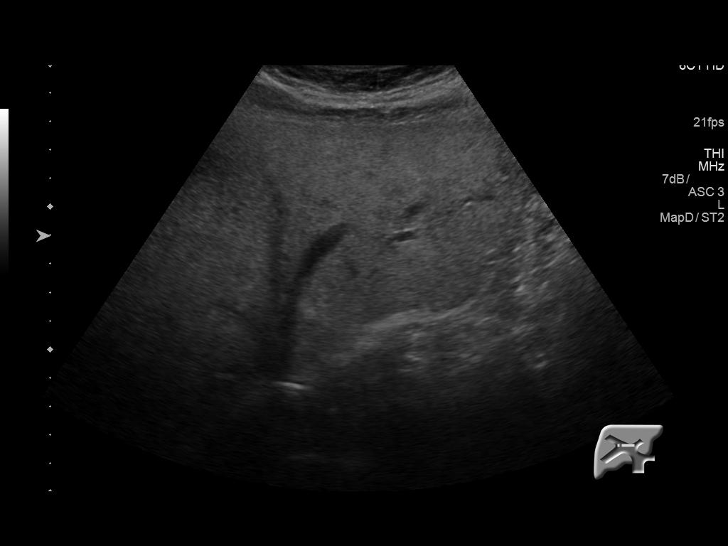
[im 37/111]
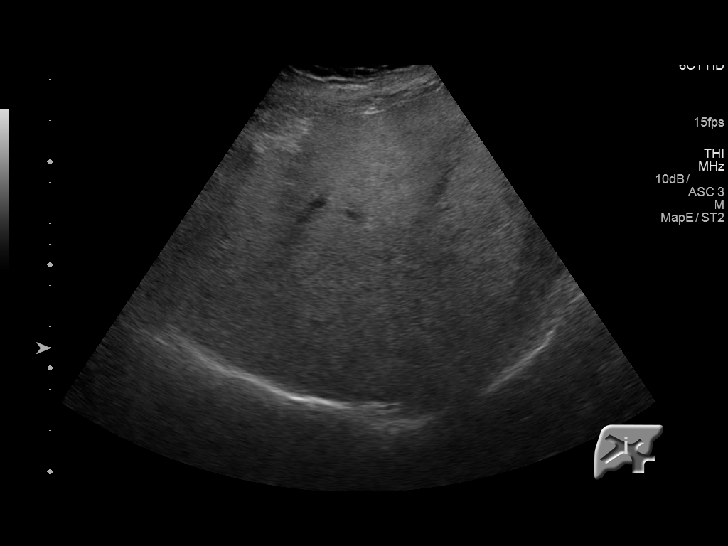
[im 42/111]
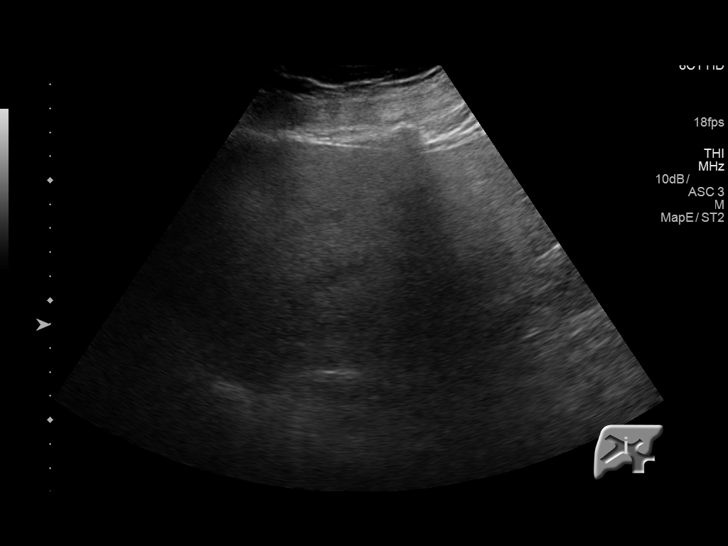
[im 51/111]
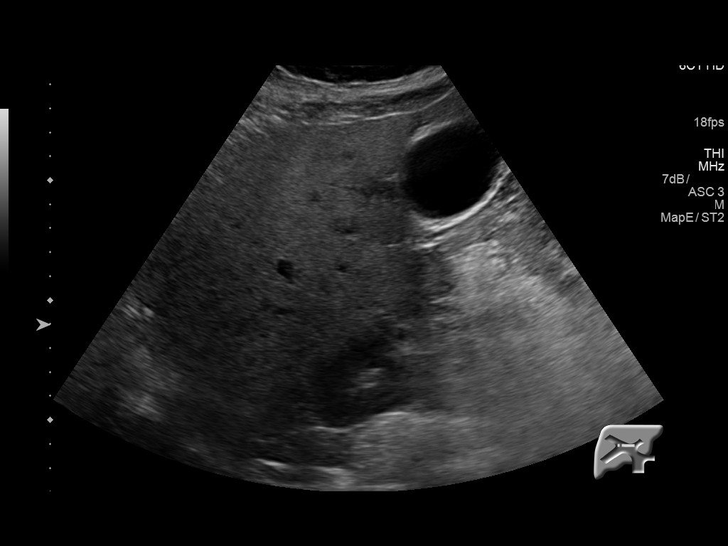
[im 60/111]
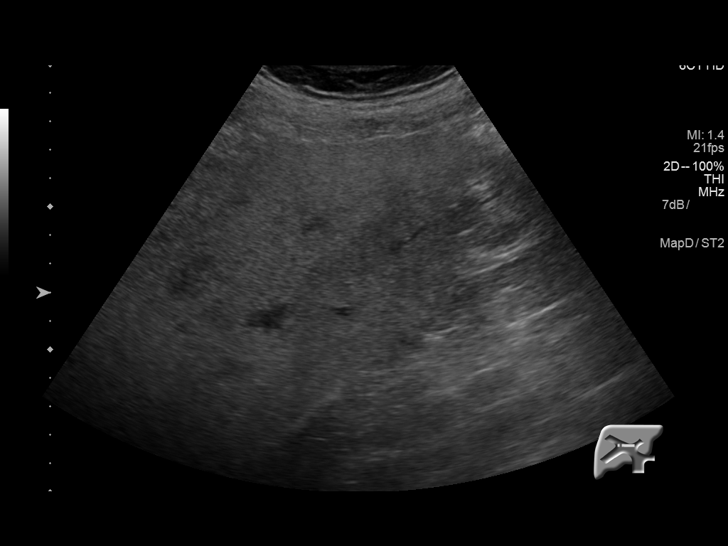
[im 69/111]
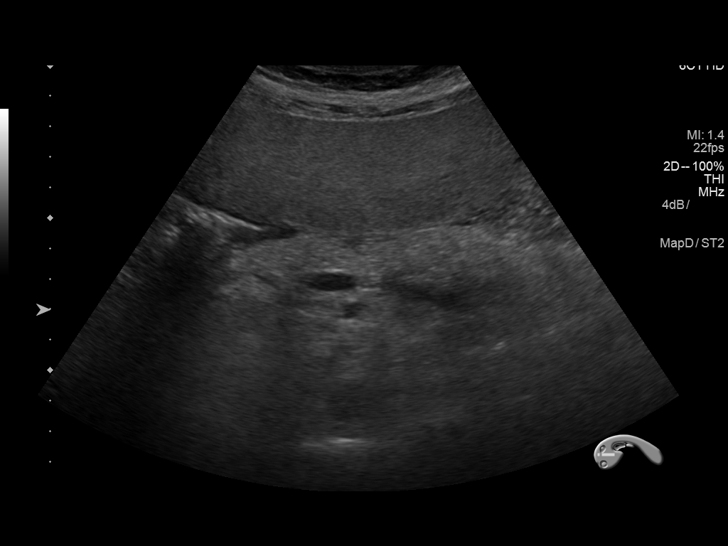
[im 74/111]
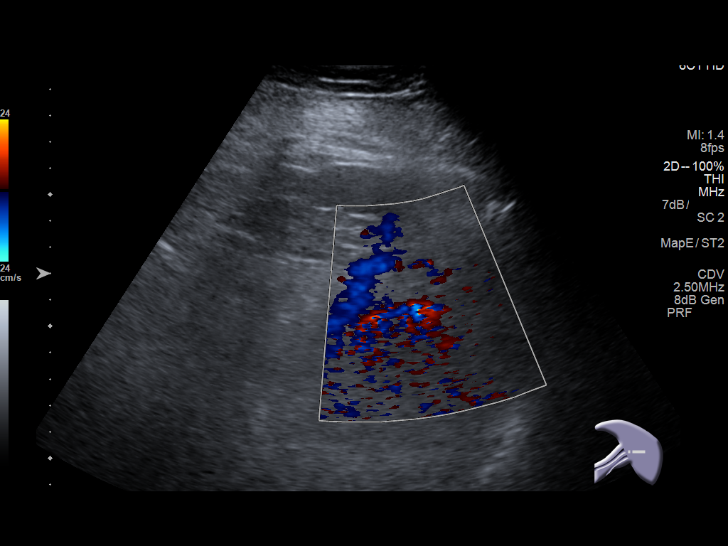
[im 83/111]
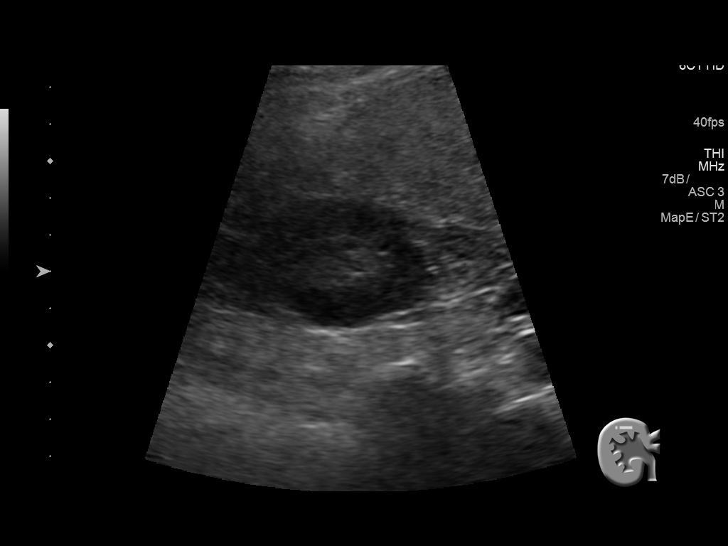
[im 92/111]
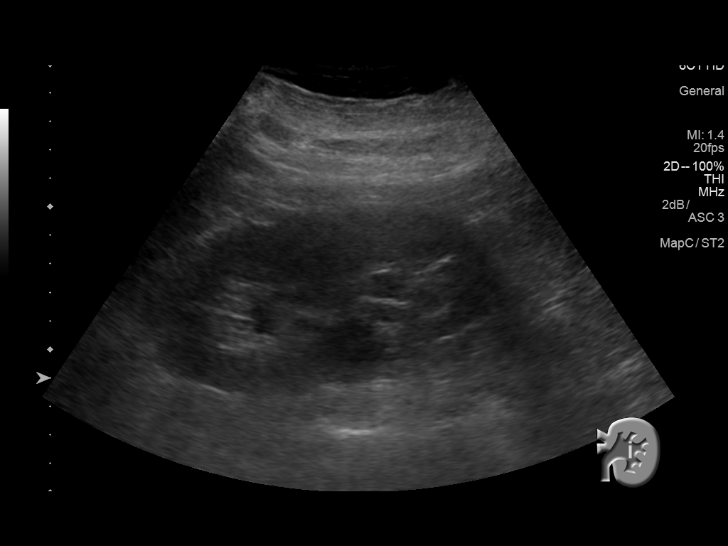
[im 101/111]
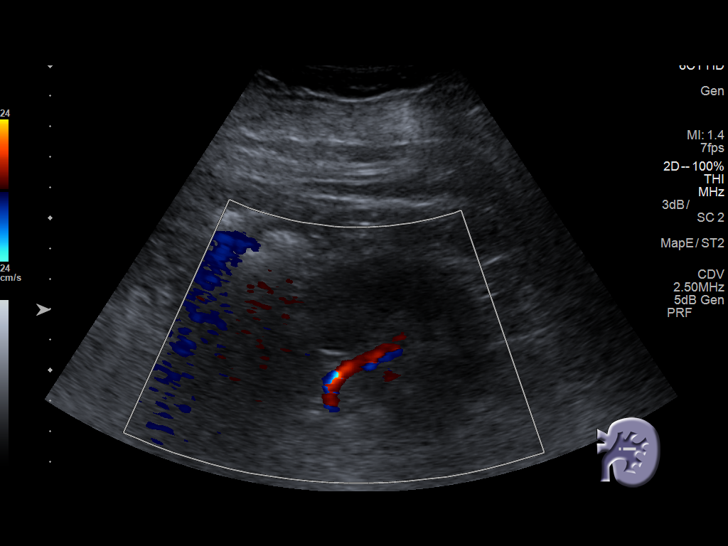
[im 111/111]
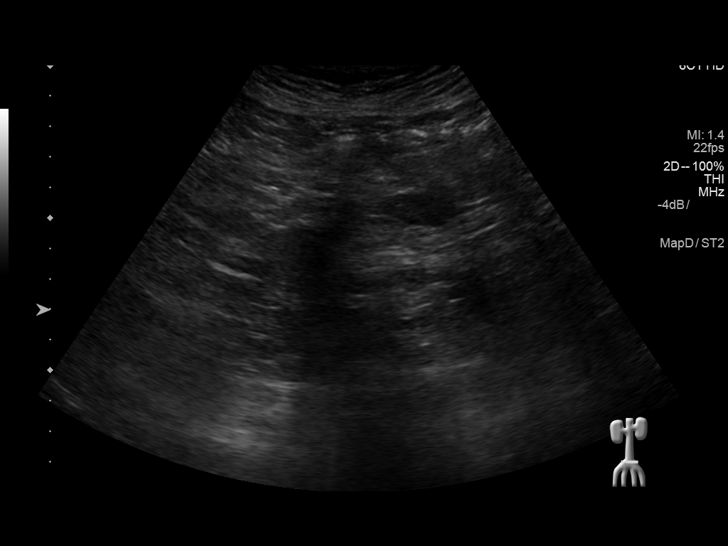

[14 of 25 positions shown; findings below may reference images not displayed]

FINDINGS: Gallbladder: No gallstones or wall thickening visualized. No
sonographic Murphy sign noted by sonographer.

Common bile duct: Diameter: 5 mm

Liver: No focal lesion identified. Increased hepatic parenchymal
echogenicity.

IVC: No abnormality visualized.

Pancreas: Visualized portion unremarkable.

Spleen: Size and appearance within normal limits.

Right Kidney: Length: 12.8 cm. 1.9 x 1 x 1.6 cm anechoic right renal
mass in the lower pole likely representing a small cyst.
Echogenicity within normal limits. No hydronephrosis visualized.

Left Kidney: Length: 12.6 cm. 4.3 x 3.1 x 3.9 cm anechoic midpole
renal mass most consistent with a cyst. Echogenicity within normal
limits. No hydronephrosis visualized.

Abdominal aorta: No aneurysm visualized.

Other findings: None.
IMPRESSION: 1. No cholelithiasis or sonographic evidence of acute cholecystitis.
2. Increased hepatic echogenicity as can be seen with hepatic
steatosis.

## 2016-04-18 ENCOUNTER — Encounter: Payer: Self-pay | Admitting: Family Medicine

## 2016-04-18 ENCOUNTER — Ambulatory Visit (INDEPENDENT_AMBULATORY_CARE_PROVIDER_SITE_OTHER): Payer: Medicare HMO | Admitting: Family Medicine

## 2016-04-18 VITALS — BP 145/83 | HR 94 | Temp 98.8°F | Wt 211.0 lb

## 2016-04-18 DIAGNOSIS — J069 Acute upper respiratory infection, unspecified: Secondary | ICD-10-CM

## 2016-04-18 MED ORDER — FLUTICASONE PROPIONATE 50 MCG/ACT NA SUSP: 2.0000 | Freq: Every day | NASAL | 6 refills | Status: DC

## 2016-04-18 NOTE — Patient Instructions (Signed)
Follow up as needed

## 2016-04-18 NOTE — Progress Notes (Signed)
   BP (!) 145/83   Pulse 94   Temp 98.8 F (37.1 C)   Wt 211 lb (95.7 kg)   SpO2 93%   BMI 36.45 kg/m    Subjective:    Patient ID: Alyssa Mayo, female    DOB: [birth year], 70 y.o.   MRN: [id number]  HPI: Alyssa Mayo is a 70 y.o. female  Chief Complaint  Patient presents with  . Sinusitis    throat got scratchy on Wednesday. runny nose, sneezing, head and chest congestion, non productive cough, some ear pressure. No fever.    2 day history of scratchy throat, sneezing, coughing, sinus congestion, aches, fatigue. Taking alka seltzer plus and claritin spray with some relief. Denies fever, N/V/D, CP, SOB. Denies sick contacts.   Relevant past medical, surgical, family and social history reviewed and updated as indicated. Interim medical history since our last visit reviewed. Allergies and medications reviewed and updated.  Review of Systems  Constitutional: Positive for fatigue.  HENT: Positive for congestion, rhinorrhea, sinus pressure, sneezing and sore throat.   Eyes: Negative.   Respiratory: Positive for cough.   Cardiovascular: Negative.   Gastrointestinal: Negative.   Genitourinary: Negative.   Musculoskeletal: Negative.   Skin: Negative.   Neurological: Negative.   Psychiatric/Behavioral: Negative.     Per HPI unless specifically indicated above     Objective:    BP (!) 145/83   Pulse 94   Temp 98.8 F (37.1 C)   Wt 211 lb (95.7 kg)   SpO2 93%   BMI 36.45 kg/m   Wt Readings from Last 3 Encounters:  04/18/16 211 lb (95.7 kg)  03/10/16 212 lb (96.2 kg)  12/10/15 213 lb (96.6 kg)    Physical Exam  Constitutional: She is oriented to person, place, and time. She appears well-developed and well-nourished. No distress.  HENT:  Head: Atraumatic.  Right Ear: External ear normal.  Left Ear: External ear normal.  Mouth/Throat: No oropharyngeal exudate.  Clear drainage and erythematous nasal membranes b/l  Eyes: Conjunctivae are normal. Pupils are  equal, round, and reactive to light. No scleral icterus.  Neck: Normal range of motion. Neck supple.  Cardiovascular: Normal rate, regular rhythm and normal heart sounds.   Pulmonary/Chest: Effort normal and breath sounds normal. No respiratory distress.  Musculoskeletal: Normal range of motion.  Lymphadenopathy:    She has no cervical adenopathy.  Neurological: She is alert and oriented to person, place, and time.  Skin: Skin is warm and dry.  Psychiatric: She has a normal mood and affect. Her behavior is normal.  Nursing note and vitals reviewed.     Assessment & Plan:   Problem List Items Addressed This Visit    None    Visit Diagnoses    Upper respiratory infection    -  Primary   Suspect viral at this point, recommended flonase, claritin, and delsym for symptomatic relief. Rest, good hydration. Follow up if no improvement in 5-7 days.        Follow up plan: Return if symptoms worsen or fail to improve.

## 2016-06-09 ENCOUNTER — Ambulatory Visit (INDEPENDENT_AMBULATORY_CARE_PROVIDER_SITE_OTHER): Payer: Medicare HMO | Admitting: Family Medicine

## 2016-06-09 ENCOUNTER — Encounter: Payer: Self-pay | Admitting: Family Medicine

## 2016-06-09 VITALS — BP 151/79 | HR 84 | Temp 98.3°F | Wt 211.0 lb

## 2016-06-09 DIAGNOSIS — E119 Type 2 diabetes mellitus without complications: Secondary | ICD-10-CM

## 2016-06-09 DIAGNOSIS — Z23 Encounter for immunization: Secondary | ICD-10-CM

## 2016-06-09 DIAGNOSIS — E782 Mixed hyperlipidemia: Secondary | ICD-10-CM

## 2016-06-09 DIAGNOSIS — I10 Essential (primary) hypertension: Secondary | ICD-10-CM

## 2016-06-09 LAB — LP+ALT+AST PICCOLO, WAIVED
ALT (SGPT) Piccolo, Waived: 37 U/L (ref 10–47)
AST (SGOT) Piccolo, Waived: 24 U/L (ref 11–38)
CHOL/HDL RATIO PICCOLO,WAIVE: 2.8 mg/dL
Cholesterol Piccolo, Waived: 189 mg/dL (ref <200)
HDL CHOL PICCOLO, WAIVED: 81 mg/dL (ref >59)
LDL CHOL CALC PICCOLO WAIVED: 58 mg/dL (ref <100)
TRIGLYCERIDES PICCOLO,WAIVED: 248 mg/dL — AB (ref <150)
VLDL CHOL CALC PICCOLO,WAIVE: 50 mg/dL — AB (ref <30)

## 2016-06-09 LAB — MICROALBUMIN, URINE WAIVED
Creatinine, Urine Waived: 50 mg/dL (ref 10–300)
Microalb, Ur Waived: 10 mg/L (ref 0–19)

## 2016-06-09 LAB — BAYER DCA HB A1C WAIVED: HB A1C (BAYER DCA - WAIVED): 8.1 % — ABNORMAL HIGH (ref <7.0)

## 2016-06-09 MED ORDER — LIRAGLUTIDE 18 MG/3ML ~~LOC~~ SOPN: 1.8000 mg | PEN_INJECTOR | Freq: Every day | SUBCUTANEOUS | 12 refills | Status: DC

## 2016-06-09 NOTE — Assessment & Plan Note (Signed)
The current medical regimen is effective;  continue present plan and medications.  

## 2016-06-09 NOTE — Progress Notes (Signed)
BP (!) 151/79   Pulse 84   Temp 98.3 F (36.8 C)   Wt 211 lb (95.7 kg)   PF 98 L/min   BMI 36.45 kg/m    Subjective:    Patient ID: Alyssa Mayo, female    DOB: 03/28/1946, 70 y.o.   MRN: [id number]  HPI: Alyssa Mayo is a 70 y.o. female  Chief Complaint  Patient presents with  . Hyperlipidemia  . Hypertension  . Diabetes  Discussed with patient blood pressure cholesterol doing well no issues with medications. Diabetes doing well no complaints taking all medications faithfully without issues Hasn't been able to lose weight weight is stable.  Relevant past medical, surgical, family and social history reviewed and updated as indicated. Interim medical history since our last visit reviewed. Allergies and medications reviewed and updated.  Review of Systems  Constitutional: Negative.   Respiratory: Negative.   Cardiovascular: Negative.     Per HPI unless specifically indicated above     Objective:    BP (!) 151/79   Pulse 84   Temp 98.3 F (36.8 C)   Wt 211 lb (95.7 kg)   PF 98 L/min   BMI 36.45 kg/m   Wt Readings from Last 3 Encounters:  06/09/16 211 lb (95.7 kg)  04/18/16 211 lb (95.7 kg)  03/10/16 212 lb (96.2 kg)    Physical Exam  Constitutional: She is oriented to person, place, and time. She appears well-developed and well-nourished. No distress.  HENT:  Head: Normocephalic and atraumatic.  Right Ear: Hearing normal.  Left Ear: Hearing normal.  Nose: Nose normal.  Eyes: Conjunctivae and lids are normal. Right eye exhibits no discharge. Left eye exhibits no discharge. No scleral icterus.  Cardiovascular: Normal rate, regular rhythm and normal heart sounds.   Pulmonary/Chest: Effort normal and breath sounds normal. No respiratory distress.  Musculoskeletal: Normal range of motion.  Neurological: She is alert and oriented to person, place, and time.  Skin: Skin is intact. No rash noted.  Psychiatric: She has a normal mood and affect. Her  speech is normal and behavior is normal. Judgment and thought content normal. Cognition and memory are normal.    Results for orders placed or performed in visit on 03/10/16  Hemoglobin A1c  Result Value Ref Range   Hemoglobin A1C 7.6       Assessment & Plan:   Problem List Items Addressed This Visit      Cardiovascular and Mediastinum   Essential hypertension    The current medical regimen is effective;  continue present plan and medications.       Relevant Orders   Basic metabolic panel     Endocrine   Diabetes mellitus without complication (HCC) - Primary    Poor control long-term with worsening this last 3 months will add Victoza reviewed use of Victoza patient gave herself first injection in the office with me today. Will increase gradually to 1.8 a day      Relevant Medications   liraglutide 18 MG/3ML SOPN   Other Relevant Orders   Bayer DCA Hb A1c Waived   Microalbumin, Urine Waived     Other   Hyperlipidemia    The current medical regimen is effective;  continue present plan and medications.       Relevant Orders   LP+ALT+AST [place], [place]    Other Visit Diagnoses    Needs flu shot       Encounter for immunization       Relevant  Orders   Flu vaccine HIGH DOSE PF (Completed)       Follow up plan: Return in about 3 months (around 09/09/2016) for Hemoglobin A1c.

## 2016-06-09 NOTE — Assessment & Plan Note (Signed)
Poor control long-term with worsening this last 3 months will add Victoza reviewed use of Victoza patient gave herself first injection in the office with me today. Will increase gradually to 1.8 a day

## 2016-06-10 ENCOUNTER — Encounter: Payer: Self-pay | Admitting: Family Medicine

## 2016-06-10 LAB — BASIC METABOLIC PANEL
BUN / CREAT RATIO: 25 (ref 12–28)
BUN: 19 mg/dL (ref 8–27)
CALCIUM: 9.6 mg/dL (ref 8.7–10.3)
CHLORIDE: 101 mmol/L (ref 96–106)
CO2: 22 mmol/L (ref 18–29)
Creatinine, Ser: 0.75 mg/dL (ref 0.57–1.00)
GFR, EST AFRICAN AMERICAN: 93 mL/min/{1.73_m2} (ref >59)
GFR, EST NON AFRICAN AMERICAN: 80 mL/min/{1.73_m2} (ref >59)
Glucose: 190 mg/dL — ABNORMAL HIGH (ref 65–99)
POTASSIUM: 5 mmol/L (ref 3.5–5.2)
Sodium: 141 mmol/L (ref 134–144)

## 2016-06-16 ENCOUNTER — Telehealth: Payer: Self-pay | Admitting: Family Medicine

## 2016-06-16 NOTE — Telephone Encounter (Signed)
call

## 2016-06-16 NOTE — Telephone Encounter (Signed)
Routing to provider  

## 2016-06-16 NOTE — Telephone Encounter (Signed)
Phone call Patient with back hip pain question whether it's coming from her hip or back. This pain to getting worse. Patient will come in for an office visit to be evaluated to decide which way to go with this pain.

## 2016-06-17 ENCOUNTER — Ambulatory Visit
Admission: RE | Admit: 2016-06-17 | Discharge: 2016-06-17 | Disposition: A | Discharge status: Home / Self Care | Payer: Medicare HMO | Source: Ambulatory Visit | Attending: Family Medicine | Admitting: Family Medicine

## 2016-06-17 ENCOUNTER — Other Ambulatory Visit: Payer: Self-pay | Admitting: Family Medicine

## 2016-06-17 ENCOUNTER — Ambulatory Visit (INDEPENDENT_AMBULATORY_CARE_PROVIDER_SITE_OTHER): Payer: Medicare HMO

## 2016-06-17 ENCOUNTER — Encounter: Payer: Self-pay | Admitting: Family Medicine

## 2016-06-17 ENCOUNTER — Telehealth: Payer: Self-pay | Admitting: Family Medicine

## 2016-06-17 ENCOUNTER — Ambulatory Visit (INDEPENDENT_AMBULATORY_CARE_PROVIDER_SITE_OTHER): Payer: Medicare HMO | Admitting: Family Medicine

## 2016-06-17 DIAGNOSIS — M5441 Lumbago with sciatica, right side: Secondary | ICD-10-CM

## 2016-06-17 DIAGNOSIS — M545 Low back pain, unspecified: Secondary | ICD-10-CM | POA: Insufficient documentation

## 2016-06-17 DIAGNOSIS — G8929 Other chronic pain: Secondary | ICD-10-CM | POA: Insufficient documentation

## 2016-06-17 DIAGNOSIS — M25551 Pain in right hip: Secondary | ICD-10-CM | POA: Insufficient documentation

## 2016-06-17 DIAGNOSIS — M4697 Unspecified inflammatory spondylopathy, lumbosacral region: Secondary | ICD-10-CM | POA: Insufficient documentation

## 2016-06-17 DIAGNOSIS — M5136 Other intervertebral disc degeneration, lumbar region: Secondary | ICD-10-CM | POA: Diagnosis not present

## 2016-06-17 DIAGNOSIS — M2578 Osteophyte, vertebrae: Secondary | ICD-10-CM | POA: Insufficient documentation

## 2016-06-17 NOTE — Telephone Encounter (Signed)
Phone call Discussed with patient hip x-ray is okay back x-ray showing multiple degenerative changes with recommendation for MRI by radiology will order. Discussed with patient

## 2016-06-17 NOTE — Assessment & Plan Note (Signed)
Back pain hip pain we will get x-rays

## 2016-06-17 NOTE — Progress Notes (Signed)
BP 133/76 (BP Location: Left Arm, Patient Position: Sitting, Cuff Size: Normal)   Pulse 91   Temp 98.1 F (36.7 C)   Ht 5' 3.38" (1.61 m)   Wt 210 lb 6.4 oz (95.4 kg)   BMI 36.83 kg/m    Subjective:    Patient ID: Alyssa Mayo, female    DOB: 20-Sep-1945, 70 y.o.   MRN: [id number]  HPI: Alyssa Mayo is a 70 y.o. female  Chief Complaint  Patient presents with  . Hip Pain    Several months. Worse over last couple weeks. Right.   Patient with long-term history of right lower back hip area pain. Has been taking Celebrex with fair relief but with no trauma no other changes symptoms been getting dramatically worse especially with driving using the brake or  gas pedal seems to hurt worse some slight radiation. Getting in and out of the car hurts also. Patient walking very gingerly  Relevant past medical, surgical, family and social history reviewed and updated as indicated. Interim medical history since our last visit reviewed. Allergies and medications reviewed and updated.  Review of Systems  Constitutional: Negative.   Respiratory: Negative.   Cardiovascular: Negative.   Gastrointestinal: Negative.   Genitourinary: Negative.     Per HPI unless specifically indicated above     Objective:    BP 133/76 (BP Location: Left Arm, Patient Position: Sitting, Cuff Size: Normal)   Pulse 91   Temp 98.1 F (36.7 C)   Ht 5' 3.38" (1.61 m)   Wt 210 lb 6.4 oz (95.4 kg)   BMI 36.83 kg/m   Wt Readings from Last 3 Encounters:  06/17/16 210 lb 6.4 oz (95.4 kg)  06/09/16 211 lb (95.7 kg)  04/18/16 211 lb (95.7 kg)    Physical Exam  Constitutional: She is oriented to person, place, and time. She appears well-developed and well-nourished. No distress.  HENT:  Head: Normocephalic and atraumatic.  Right Ear: Hearing normal.  Left Ear: Hearing normal.  Nose: Nose normal.  Eyes: Conjunctivae and lids are normal. Right eye exhibits no discharge. Left eye exhibits no discharge.  No scleral icterus.  Cardiovascular: Normal rate, regular rhythm and normal heart sounds.   Pulmonary/Chest: Effort normal and breath sounds normal. No respiratory distress.  Musculoskeletal: Normal range of motion.  Normal strength balance and muscle tone Some pain with right hip grind motion  Neurological: She is alert and oriented to person, place, and time. She displays normal reflexes. She exhibits abnormal muscle tone. Coordination abnormal.  Skin: Skin is intact. No rash noted.  Psychiatric: She has a normal mood and affect. Her speech is normal and behavior is normal. Judgment and thought content normal. Cognition and memory are normal.    Results for orders placed or performed in visit on 06/09/16  Bayer DCA Hb A1c Waived  Result Value Ref Range   Bayer DCA Hb A1c Waived 8.1 (H) <7.0 %  Basic metabolic panel  Result Value Ref Range   Glucose 190 (H) 65 - 99 mg/dL   BUN 19 8 - 27 mg/dL   Creatinine, Ser 0.48 0.57 - 1.00 mg/dL   GFR calc non Af Amer 80 >59 mL/min/1.73   GFR calc Af Amer 93 >59 mL/min/1.73   BUN/Creatinine Ratio 25 12 - 28   Sodium 141 134 - 144 mmol/L   Potassium 5.0 3.5 - 5.2 mmol/L   Chloride 101 96 - 106 mmol/L   CO2 22 18 - 29 mmol/L   Calcium  9.6 8.7 - 10.3 mg/dL  LP+ALT+AST Piccolo, Waived  Result Value Ref Range   ALT (SGPT) Piccolo, Waived 37 10 - 47 U/L   AST (SGOT) Piccolo, Waived 24 11 - 38 U/L   Cholesterol Piccolo, Waived 189 <200 mg/dL   HDL Chol Piccolo, Waived 81 >59 mg/dL   Triglycerides Piccolo,Waived 248 (H) <150 mg/dL   Chol/HDL Ratio Piccolo,Waive 2.8 mg/dL   LDL Chol Calc Piccolo Waived 58 <100 mg/dL   VLDL Chol Calc Piccolo,Waive 50 (H) <30 mg/dL  Microalbumin, Urine Waived  Result Value Ref Range   Microalb, Ur Waived 10 0 - 19 mg/L   Creatinine, Urine Waived 50 10 - 300 mg/dL   Microalb/Creat Ratio 30-300 (H) <30 mg/g      Assessment & Plan:   Problem List Items Addressed This Visit      Other   Low back pain    Back  pain hip pain we will get x-rays      Relevant Orders   XR Lumbar Spine Complete   XR HIP UNILAT W OR W/O PELVIS 2-3 VIEWS RIGHT   Chronic right hip pain    Back pain hip pain we will get x-rays discussed Tylenol use of Celebrex and pending x-rays further considerations including possibly PT and/or referrals.      Relevant Orders   XR Lumbar Spine Complete   XR HIP UNILAT W OR W/O PELVIS 2-3 VIEWS RIGHT       Follow up plan: Return for As scheduled.

## 2016-06-17 NOTE — Assessment & Plan Note (Signed)
Back pain hip pain we will get x-rays discussed Tylenol use of Celebrex and pending x-rays further considerations including possibly PT and/or referrals.

## 2016-06-23 ENCOUNTER — Telehealth: Payer: Self-pay

## 2016-06-23 DIAGNOSIS — M5441 Lumbago with sciatica, right side: Principal | ICD-10-CM

## 2016-06-23 DIAGNOSIS — G8929 Other chronic pain: Secondary | ICD-10-CM

## 2016-06-23 NOTE — Telephone Encounter (Signed)
Call pt re mri

## 2016-06-23 NOTE — Telephone Encounter (Signed)
Insurance denied patient's request for MRI as a consequence will send for orthopedic review

## 2016-06-23 NOTE — Telephone Encounter (Signed)
Prior Authorization for patient's MRI was denied.  Her appointment to have the MRI is 07/02/2016.  The number to call for a peer to peer is [phone]

## 2016-06-23 NOTE — Telephone Encounter (Signed)
Phone call

## 2016-06-24 ENCOUNTER — Telehealth: Payer: Self-pay | Admitting: Family Medicine

## 2016-06-24 NOTE — Telephone Encounter (Signed)
Pt called stated that her insurance has denied her MRI due to needing a Peer 2 Peer interview with Dr. Dossie Arbourrissman. Please call [phone] to complete this interview. Pt stated they should approve the MRI once this is completed. Thanks.

## 2016-06-24 NOTE — Telephone Encounter (Signed)
Routing to provider  

## 2016-06-24 NOTE — Telephone Encounter (Signed)
Call ins get me on the line when there is someone to talk to

## 2016-06-25 ENCOUNTER — Other Ambulatory Visit: Payer: Self-pay | Admitting: Gastroenterology

## 2016-06-25 DIAGNOSIS — K760 Fatty (change of) liver, not elsewhere classified: Secondary | ICD-10-CM | POA: Diagnosis not present

## 2016-06-25 DIAGNOSIS — K740 Hepatic fibrosis: Secondary | ICD-10-CM | POA: Diagnosis not present

## 2016-06-25 DIAGNOSIS — E8801 Alpha-1-antitrypsin deficiency: Secondary | ICD-10-CM | POA: Diagnosis not present

## 2016-06-25 NOTE — Telephone Encounter (Signed)
Appointment set up with [employer] to do a peer to peer tomorrow at 12:00 with Dr. Barbara CowerMurano.  They will contact me first and I will get Dr. Dossie Arbourrissman.   Routing to provider for FYI.

## 2016-06-26 NOTE — Telephone Encounter (Signed)
Phone call discussed with patient insurance denying MRI until she has been sick for 6 weeks. Patient actually reporting she is a little better today has appointment with orthopedics tomorrow is interested in physical therapy referral We will wait until after orthopedics evaluation and consider PT.

## 2016-06-26 NOTE — Telephone Encounter (Signed)
Call pt 

## 2016-06-27 DIAGNOSIS — M5136 Other intervertebral disc degeneration, lumbar region: Secondary | ICD-10-CM | POA: Diagnosis not present

## 2016-07-02 ENCOUNTER — Ambulatory Visit: Payer: Medicare HMO

## 2016-07-04 DIAGNOSIS — M545 Low back pain: Secondary | ICD-10-CM | POA: Diagnosis not present

## 2016-07-07 ENCOUNTER — Ambulatory Visit
Admission: RE | Admit: 2016-07-07 | Discharge: 2016-07-07 | Disposition: A | Discharge status: Home / Self Care | Payer: Medicare HMO | Source: Ambulatory Visit | Attending: Gastroenterology | Admitting: Gastroenterology

## 2016-07-07 DIAGNOSIS — K760 Fatty (change of) liver, not elsewhere classified: Secondary | ICD-10-CM | POA: Diagnosis present

## 2016-07-07 DIAGNOSIS — N281 Cyst of kidney, acquired: Secondary | ICD-10-CM | POA: Insufficient documentation

## 2016-07-07 DIAGNOSIS — K7689 Other specified diseases of liver: Secondary | ICD-10-CM | POA: Diagnosis not present

## 2016-07-07 DIAGNOSIS — R932 Abnormal findings on diagnostic imaging of liver and biliary tract: Secondary | ICD-10-CM | POA: Diagnosis not present

## 2016-07-10 DIAGNOSIS — M545 Low back pain: Secondary | ICD-10-CM | POA: Diagnosis not present

## 2016-07-16 DIAGNOSIS — R69 Illness, unspecified: Secondary | ICD-10-CM | POA: Diagnosis not present

## 2016-07-22 ENCOUNTER — Ambulatory Visit (INDEPENDENT_AMBULATORY_CARE_PROVIDER_SITE_OTHER): Payer: Medicare HMO | Admitting: Family Medicine

## 2016-07-22 ENCOUNTER — Encounter: Payer: Self-pay | Admitting: Family Medicine

## 2016-07-22 VITALS — BP 161/71 | HR 70 | Temp 101.3°F | Ht 63.0 in | Wt 207.0 lb

## 2016-07-22 DIAGNOSIS — R05 Cough: Secondary | ICD-10-CM | POA: Diagnosis not present

## 2016-07-22 DIAGNOSIS — R509 Fever, unspecified: Secondary | ICD-10-CM | POA: Diagnosis not present

## 2016-07-22 DIAGNOSIS — J101 Influenza due to other identified influenza virus with other respiratory manifestations: Secondary | ICD-10-CM

## 2016-07-22 DIAGNOSIS — R059 Cough, unspecified: Secondary | ICD-10-CM

## 2016-07-22 LAB — VERITOR FLU A/B WAIVED
INFLUENZA A: NEGATIVE
Influenza B: POSITIVE — AB

## 2016-07-22 MED ORDER — OSELTAMIVIR PHOSPHATE 75 MG PO CAPS: 75.0000 mg | ORAL_CAPSULE | Freq: Two times a day (BID) | ORAL | 0 refills | Status: DC

## 2016-07-22 MED ORDER — BENZONATATE 100 MG PO CAPS: 200.0000 mg | ORAL_CAPSULE | Freq: Three times a day (TID) | ORAL | 0 refills | Status: DC | PRN

## 2016-07-22 MED ORDER — HYDROCOD POLST-CPM POLST ER 10-8 MG/5ML PO SUER: 5.0000 mL | Freq: Two times a day (BID) | ORAL | 0 refills | Status: DC | PRN

## 2016-07-22 NOTE — Progress Notes (Signed)
BP (!) 161/71   Pulse 70   Temp (!) 101.3 F (38.5 C) (Oral)   Ht 5\' 3"  (1.6 m)   Wt 207 lb (93.9 kg)   SpO2 96%   BMI 36.67 kg/m    Subjective:    Patient ID: Alyssa Mayo, female    DOB: 1946-02-28, 71 y.o.   MRN: [id number]  HPI: Alyssa Mayo is a 71 y.o. female  Chief Complaint  Patient presents with  . URI  . Fever   Patient presents with 1 day history of high fever, chills, body aches, hacking cough, and hoarseness. States she has had mild intermittent sxs the past week or so but last night the fever, fatigue, and aches set in. Has been taking tylenol and aspirin regularly, last dose at 11 am. Denies CP, SOB, N/V/D. No sick contacts she is aware of, but recently traveled and went to [place] so has been around many people.   Past Medical History:  Diagnosis Date  . Arthritis   . Diabetes mellitus without complication (HCC)   . Elevated liver enzymes   . Fatty liver   . Hyperlipidemia   . Lifelong obesity   . Sleep apnea    Social History   Social History  . Marital status: Married    Spouse name: N/A  . Number of children: N/A  . Years of education: N/A   Occupational History  . Not on file.   Social History Main Topics  . Smoking status: Never Smoker  . Smokeless tobacco: Never Used  . Alcohol use 0.0 oz/week     Comment: rare  . Drug use: No  . Sexual activity: Not on file   Other Topics Concern  . Not on file   Social History Narrative  . No narrative on file  cri Relevant past medical, surgical, family and social history reviewed and updated as indicated. Interim medical history since our last visit reviewed. Allergies and medications reviewed and updated.  Review of Systems  Constitutional: Positive for chills, diaphoresis, fatigue and fever.  HENT: Positive for congestion, rhinorrhea and sore throat.   Eyes: Negative.   Respiratory: Positive for cough.   Gastrointestinal: Negative.   Genitourinary: Negative.     Musculoskeletal: Positive for myalgias.  Neurological: Negative.   Psychiatric/Behavioral: Negative.     Per HPI unless specifically indicated above     Objective:    BP (!) 161/71   Pulse 70   Temp (!) 101.3 F (38.5 C) (Oral)   Ht 5\' 3"  (1.6 m)   Wt 207 lb (93.9 kg)   SpO2 96%   BMI 36.67 kg/m   Wt Readings from Last 3 Encounters:  07/22/16 207 lb (93.9 kg)  06/17/16 210 lb 6.4 oz (95.4 kg)  06/09/16 211 lb (95.7 kg)    Physical Exam  Constitutional: She is oriented to person, place, and time. She appears well-developed and well-nourished. No distress.  HENT:  Head: Atraumatic.  Right Ear: External ear normal.  Left Ear: External ear normal.  Oropharynx erythematous  Eyes: Conjunctivae are normal. Pupils are equal, round, and reactive to light. No scleral icterus.  Neck: Normal range of motion. Neck supple.  Cardiovascular: Normal rate and normal heart sounds.   Pulmonary/Chest: Effort normal and breath sounds normal. No respiratory distress.  Musculoskeletal: Normal range of motion.  Lymphadenopathy:    She has no cervical adenopathy.  Neurological: She is alert and oriented to person, place, and time.  Skin: Skin is warm and  dry.  Face is flushed  Psychiatric: She has a normal mood and affect. Her behavior is normal.  Nursing note and vitals reviewed.     Assessment & Plan:   Problem List Items Addressed This Visit    None    Visit Diagnoses    Influenza B    -  Primary   Tamiflu sent. Continue alternative tylenol and ibuprofen for fever control. Rest, good hydration.    Relevant Medications   oseltamivir (TAMIFLU) 75 MG capsule   Other Relevant Orders   Influenza A & B (STAT)   Cough       Tessalon perles and tussionex given. Precautions discussed with tussionex. patient agreeable to plan.    Relevant Orders   Influenza A & B (STAT)       Follow up plan: Return if symptoms worsen or fail to improve.

## 2016-07-22 NOTE — Patient Instructions (Signed)
Follow up if no improvement 

## 2016-08-28 DIAGNOSIS — R69 Illness, unspecified: Secondary | ICD-10-CM | POA: Diagnosis not present

## 2016-09-08 ENCOUNTER — Ambulatory Visit (INDEPENDENT_AMBULATORY_CARE_PROVIDER_SITE_OTHER): Payer: Medicare HMO | Admitting: Family Medicine

## 2016-09-08 ENCOUNTER — Encounter: Payer: Self-pay | Admitting: Family Medicine

## 2016-09-08 VITALS — BP 124/79 | HR 85 | Ht 63.0 in | Wt 201.6 lb

## 2016-09-08 DIAGNOSIS — E782 Mixed hyperlipidemia: Secondary | ICD-10-CM | POA: Diagnosis not present

## 2016-09-08 DIAGNOSIS — I10 Essential (primary) hypertension: Secondary | ICD-10-CM

## 2016-09-08 DIAGNOSIS — Z23 Encounter for immunization: Secondary | ICD-10-CM

## 2016-09-08 DIAGNOSIS — E119 Type 2 diabetes mellitus without complications: Secondary | ICD-10-CM

## 2016-09-08 LAB — BAYER DCA HB A1C WAIVED: HB A1C (BAYER DCA - WAIVED): 7.5 % — ABNORMAL HIGH (ref <7.0)

## 2016-09-08 MED ORDER — ZOSTER VACCINE LIVE 19400 UNT/0.65ML ~~LOC~~ SUSR: 0.6500 mL | Freq: Once | SUBCUTANEOUS | 0 refills | Status: DC

## 2016-09-08 NOTE — Assessment & Plan Note (Signed)
Significant improvement in diabetes control will continue current plan and medications continue great lifestyle changes patient is making with weight loss.

## 2016-09-08 NOTE — Progress Notes (Signed)
BP 124/79   Pulse 85   Ht 5\' 3"  (1.6 m)   Wt 201 lb 9.6 oz (91.4 kg)   SpO2 97%   BMI 35.71 kg/m    Subjective:    Patient ID: Alyssa Mayo, female    DOB: 1946/03/14, 71 y.o.   MRN: [id number]  HPI: Keili Sferra is a 71 y.o. female  Chief Complaint  Patient presents with  . Follow-up  . Diabetes   Patient doing well has been very active with lifestyle and weight loss. Lost about 10 pounds and doing significantly better. No low blood sugar spells or problems. Blood pressure doing well.  Relevant past medical, surgical, family and social history reviewed and updated as indicated. Interim medical history since our last visit reviewed. Allergies and medications reviewed and updated.  Review of Systems  Constitutional: Negative.   Respiratory: Negative.   Cardiovascular: Negative.     Per HPI unless specifically indicated above     Objective:    BP 124/79   Pulse 85   Ht 5\' 3"  (1.6 m)   Wt 201 lb 9.6 oz (91.4 kg)   SpO2 97%   BMI 35.71 kg/m   Wt Readings from Last 3 Encounters:  09/08/16 201 lb 9.6 oz (91.4 kg)  07/22/16 207 lb (93.9 kg)  06/17/16 210 lb 6.4 oz (95.4 kg)    Physical Exam  Constitutional: She is oriented to person, place, and time. She appears well-developed and well-nourished. No distress.  HENT:  Head: Normocephalic and atraumatic.  Right Ear: Hearing normal.  Left Ear: Hearing normal.  Nose: Nose normal.  Eyes: Conjunctivae and lids are normal. Right eye exhibits no discharge. Left eye exhibits no discharge. No scleral icterus.  Cardiovascular: Normal rate and regular rhythm.   Pulmonary/Chest: Effort normal and breath sounds normal. No respiratory distress.  Musculoskeletal: Normal range of motion.  Neurological: She is alert and oriented to person, place, and time.  Skin: Skin is intact. No rash noted.  Psychiatric: She has a normal mood and affect. Her speech is normal and behavior is normal. Judgment and thought content  normal. Cognition and memory are normal.    Results for orders placed or performed in visit on 07/22/16  Influenza A & B (STAT)  Result Value Ref Range   Influenza A Negative Negative   Influenza B Positive (A) Negative      Assessment & Plan:   Problem List Items Addressed This Visit      Cardiovascular and Mediastinum   Essential hypertension    The current medical regimen is effective;  continue present plan and medications.       Relevant Orders   Bayer DCA Hb A1c Waived     Endocrine   Diabetes mellitus without complication (HCC) - Primary    Significant improvement in diabetes control will continue current plan and medications continue great lifestyle changes patient is making with weight loss.      Relevant Orders   Bayer DCA Hb A1c Waived     Other   Hyperlipidemia   Relevant Orders   Bayer DCA Hb A1c Waived   Morbid obesity (HCC)    Good wt loss and glucose doing better       Other Visit Diagnoses    Need for Zostavax administration         discussed shingles vaccine and patient elected to go ahead with vaccine.  Follow up plan: Return in about 3 months (around 12/06/2016) for Physical Exam,  Hemoglobin A1c.

## 2016-09-08 NOTE — Assessment & Plan Note (Signed)
The current medical regimen is effective;  continue present plan and medications.  

## 2016-09-08 NOTE — Assessment & Plan Note (Signed)
Good wt loss and glucose doing better

## 2016-09-12 DIAGNOSIS — G4733 Obstructive sleep apnea (adult) (pediatric): Secondary | ICD-10-CM | POA: Diagnosis not present

## 2016-09-30 ENCOUNTER — Telehealth: Payer: Self-pay | Admitting: Family Medicine

## 2016-09-30 NOTE — Telephone Encounter (Signed)
Patient called with a question regarding the statement she received with a balance because she doesn't understand why she would have any balance  [phone]

## 2016-10-01 NOTE — Telephone Encounter (Signed)
Contacted Ms. Zimbelman regarding her $20 balance. It appears that she is being billed for a specialist copay.  Advised Ms. Stopher that I would confirm the balance with the PB team and call her back by 10/03/16.

## 2016-10-14 DIAGNOSIS — R69 Illness, unspecified: Secondary | ICD-10-CM | POA: Diagnosis not present

## 2016-11-13 ENCOUNTER — Encounter: Payer: Self-pay | Admitting: Family Medicine

## 2016-11-13 ENCOUNTER — Ambulatory Visit (INDEPENDENT_AMBULATORY_CARE_PROVIDER_SITE_OTHER): Payer: Medicare HMO | Admitting: Family Medicine

## 2016-11-13 ENCOUNTER — Telehealth: Payer: Self-pay | Admitting: Family Medicine

## 2016-11-13 DIAGNOSIS — L989 Disorder of the skin and subcutaneous tissue, unspecified: Secondary | ICD-10-CM

## 2016-11-13 DIAGNOSIS — E119 Type 2 diabetes mellitus without complications: Secondary | ICD-10-CM | POA: Diagnosis not present

## 2016-11-13 MED ORDER — PREDNISONE 10 MG PO TABS: ORAL_TABLET | ORAL | 0 refills | Status: DC

## 2016-11-13 NOTE — Telephone Encounter (Signed)
Patient states Dr Dossie Arbour was referring her to dermatologist Dr Gwen Pounds but she is requesting to be referred to [facility].  Thanks

## 2016-11-13 NOTE — Progress Notes (Signed)
   BP (!) 142/83   Pulse 87   Wt 206 lb (93.4 kg)   SpO2 98%   BMI 36.49 kg/m    Subjective:    Patient ID: Alyssa Mayo, female    DOB: 1945/09/25, 71 y.o.   MRN: [id number]  HPI: Alyssa Mayo is a 71 y.o. female  Chief Complaint  Patient presents with  . Rash    Very itchy. Started late Feb.   Lesions him on and don't move around or change like hives. Appearance more of intense flat reddish plaques. The ones on her back seemed to its the most. No known contacts or irritants. Patient's diabetes seems to be doing okay. Stopped Victoza because of stomach pain.  Relevant past medical, surgical, family and social history reviewed and updated as indicated. Interim medical history since our last visit reviewed. Allergies and medications reviewed and updated.  Review of Systems  Constitutional: Negative.   Respiratory: Negative.   Cardiovascular: Negative.     Per HPI unless specifically indicated above     Objective:    BP (!) 142/83   Pulse 87   Wt 206 lb (93.4 kg)   SpO2 98%   BMI 36.49 kg/m   Wt Readings from Last 3 Encounters:  11/13/16 206 lb (93.4 kg)  09/08/16 201 lb 9.6 oz (91.4 kg)  07/22/16 207 lb (93.9 kg)    Physical Exam  Constitutional: She is oriented to person, place, and time. She appears well-developed and well-nourished.  HENT:  Head: Normocephalic and atraumatic.  Eyes: Conjunctivae and EOM are normal.  Neck: Normal range of motion.  Cardiovascular: Normal rate, regular rhythm and normal heart sounds.   Pulmonary/Chest: Effort normal and breath sounds normal.  Musculoskeletal: Normal range of motion.  Neurological: She is alert and oriented to person, place, and time.  Skin: No erythema.  Skin lesions as above  Psychiatric: She has a normal mood and affect. Her behavior is normal. Judgment and thought content normal.    Results for orders placed or performed in visit on 09/08/16  Bayer DCA Hb A1c Waived  Result Value Ref Range     Bayer DCA Hb A1c Waived 7.5 (H) <7.0 %      Assessment & Plan:   Problem List Items Addressed This Visit      Endocrine   Diabetes mellitus without complication (HCC)    Discussed diabetes and prednisone patient will try again with Victoza and observe for stomach pain but at least during time of taking prednisone.        Musculoskeletal and Integument   Skin lesions, generalized    Unknown itching skin lesions getting worse Will do trial of prednisone and dermatology referral.      Relevant Orders   Ambulatory referral to Dermatology       Follow up plan: Return for As scheduled.

## 2016-11-13 NOTE — Assessment & Plan Note (Addendum)
Unknown itching skin lesions getting worse Will do trial of prednisone and dermatology referral.

## 2016-11-13 NOTE — Assessment & Plan Note (Signed)
Discussed diabetes and prednisone patient will try again with Victoza and observe for stomach pain but at least during time of taking prednisone.

## 2016-11-17 NOTE — Telephone Encounter (Signed)
Referral faxed to [facility]

## 2016-11-28 ENCOUNTER — Telehealth: Payer: Self-pay | Admitting: Family Medicine

## 2016-11-28 NOTE — Telephone Encounter (Signed)
Called pt to schedule Annual Wellness Visit with Nurse Health Advisor:  - knb

## 2016-12-14 DIAGNOSIS — R69 Illness, unspecified: Secondary | ICD-10-CM | POA: Diagnosis not present

## 2016-12-17 ENCOUNTER — Ambulatory Visit (INDEPENDENT_AMBULATORY_CARE_PROVIDER_SITE_OTHER): Payer: Medicare HMO | Admitting: Family Medicine

## 2016-12-17 ENCOUNTER — Encounter: Payer: Medicare HMO | Admitting: Family Medicine

## 2016-12-17 ENCOUNTER — Encounter: Payer: Self-pay | Admitting: Family Medicine

## 2016-12-17 VITALS — BP 136/81 | HR 108 | Ht 63.25 in | Wt 206.0 lb

## 2016-12-17 DIAGNOSIS — R748 Abnormal levels of other serum enzymes: Secondary | ICD-10-CM

## 2016-12-17 DIAGNOSIS — Z1329 Encounter for screening for other suspected endocrine disorder: Secondary | ICD-10-CM

## 2016-12-17 DIAGNOSIS — Z7189 Other specified counseling: Secondary | ICD-10-CM | POA: Diagnosis not present

## 2016-12-17 DIAGNOSIS — I10 Essential (primary) hypertension: Secondary | ICD-10-CM | POA: Diagnosis not present

## 2016-12-17 DIAGNOSIS — E785 Hyperlipidemia, unspecified: Secondary | ICD-10-CM

## 2016-12-17 DIAGNOSIS — E119 Type 2 diabetes mellitus without complications: Secondary | ICD-10-CM | POA: Diagnosis not present

## 2016-12-17 DIAGNOSIS — Z0000 Encounter for general adult medical examination without abnormal findings: Secondary | ICD-10-CM

## 2016-12-17 DIAGNOSIS — E782 Mixed hyperlipidemia: Secondary | ICD-10-CM

## 2016-12-17 LAB — URINALYSIS, ROUTINE W REFLEX MICROSCOPIC
Bilirubin, UA: NEGATIVE
KETONES UA: NEGATIVE
Leukocytes, UA: NEGATIVE
NITRITE UA: NEGATIVE
Protein, UA: NEGATIVE
RBC UA: NEGATIVE
SPEC GRAV UA: 1.01 (ref 1.005–1.030)
UUROB: 0.2 mg/dL (ref 0.2–1.0)
pH, UA: 5 (ref 5.0–7.5)

## 2016-12-17 LAB — MICROSCOPIC EXAMINATION
Bacteria, UA: NONE SEEN
RBC MICROSCOPIC, UA: NONE SEEN /HPF (ref 0–?)
WBC UA: NONE SEEN /HPF (ref 0–?)

## 2016-12-17 LAB — BAYER DCA HB A1C WAIVED: HB A1C (BAYER DCA - WAIVED): 7.5 % — ABNORMAL HIGH (ref <7.0)

## 2016-12-17 MED ORDER — CELECOXIB 200 MG PO CAPS: ORAL_CAPSULE | ORAL | 4 refills | Status: DC

## 2016-12-17 MED ORDER — METFORMIN HCL 500 MG PO TABS: 1000.0000 mg | ORAL_TABLET | Freq: Two times a day (BID) | ORAL | 4 refills | Status: DC

## 2016-12-17 MED ORDER — PANTOPRAZOLE SODIUM 40 MG PO TBEC: 40.0000 mg | DELAYED_RELEASE_TABLET | Freq: Every day | ORAL | 4 refills | Status: DC

## 2016-12-17 MED ORDER — SIMVASTATIN 40 MG PO TABS: 40.0000 mg | ORAL_TABLET | Freq: Every day | ORAL | 4 refills | Status: DC

## 2016-12-17 MED ORDER — CANAGLIFLOZIN 300 MG PO TABS: 300.0000 mg | ORAL_TABLET | Freq: Every day | ORAL | 4 refills | Status: DC

## 2016-12-17 NOTE — Assessment & Plan Note (Signed)
Discussed diabetes continued poor control intolerance to Victoza. May consider Victoza in the future as can use lower doses and slower dosing intervals. For now patient's going to work on lifestyle factors with weight loss and exercise.

## 2016-12-17 NOTE — Assessment & Plan Note (Signed)
The current medical regimen is effective;  continue present plan and medications.  

## 2016-12-17 NOTE — Progress Notes (Signed)
BP 136/81   Pulse (!) 108   Ht 5' 3.25" (1.607 m)   Wt 206 lb (93.4 kg)   SpO2 95%   BMI 36.20 kg/m    Subjective:    Patient ID: Alyssa Mayo, female    DOB: 1945-09-02, 71 y.o.   MRN: [id number]  HPI: Alyssa Mayo is a 71 y.o. female  Chief Complaint  Patient presents with  . Annual Exam  . Numbness  [employer] met Patient follow-up diabetes not taking Victoza because was having stomach pains but after stopping Victoza still having some stomach discomfort. Is taking other medicines without problems. Unfortunately hasn't been able to lose weight. Takes Protonix faithfully still has some occasional dysphagia with chicken but not getting worse. Takes cholesterol medicine without problems. I'm also has noticed some right arm numbness not really associated with activities concerned because after surgery 30 years ago on her neck still has some numbness tingling in her first and second fingers. Patient also seems to sense the flu this winter has lost her sense of smell and taste. Hasn't tried other medications nasal rinse allergy treatments etc. Relevant past medical, surgical, family and social history reviewed and updated as indicated. Interim medical history since our last visit reviewed. Allergies and medications reviewed and updated.  Review of Systems  Constitutional: Negative.   HENT: Negative.   Eyes: Negative.   Respiratory: Negative.   Cardiovascular: Negative.   Gastrointestinal: Negative.   Endocrine: Negative.   Genitourinary: Negative.   Musculoskeletal: Negative.   Skin: Negative.   Allergic/Immunologic: Negative.   Neurological: Negative.   Hematological: Negative.   Psychiatric/Behavioral: Negative.     Per HPI unless specifically indicated above     Objective:    BP 136/81   Pulse (!) 108   Ht 5' 3.25" (1.607 m)   Wt 206 lb (93.4 kg)   SpO2 95%   BMI 36.20 kg/m   Wt Readings from Last 3 Encounters:  12/17/16 206 lb (93.4 kg)    11/13/16 206 lb (93.4 kg)  09/08/16 201 lb 9.6 oz (91.4 kg)    Physical Exam  Constitutional: She is oriented to person, place, and time. She appears well-developed and well-nourished.  HENT:  Head: Normocephalic and atraumatic.  Right Ear: External ear normal.  Left Ear: External ear normal.  Nose: Nose normal.  Mouth/Throat: Oropharynx is clear and moist.  Eyes: Conjunctivae and EOM are normal. Pupils are equal, round, and reactive to light.  Neck: Normal range of motion. Neck supple. Carotid bruit is not present.  Cardiovascular: Normal rate, regular rhythm and normal heart sounds.   No murmur heard. Pulmonary/Chest: Effort normal and breath sounds normal. She exhibits no mass. Right breast exhibits no mass, no skin change and no tenderness. Left breast exhibits no mass, no skin change and no tenderness. Breasts are symmetrical.  Abdominal: Soft. Bowel sounds are normal. There is no hepatosplenomegaly.  Musculoskeletal: Normal range of motion.  Neurological: She is alert and oriented to person, place, and time.  Skin: No rash noted.  Psychiatric: She has a normal mood and affect. Her behavior is normal. Judgment and thought content normal.    Results for orders placed or performed in visit on 09/08/16  Bayer DCA Hb A1c Waived  Result Value Ref Range   Bayer DCA Hb A1c Waived 7.5 (H) <7.0 %      Assessment & Plan:   Problem List Items Addressed This Visit      Cardiovascular and Mediastinum  Essential hypertension    The current medical regimen is effective;  continue present plan and medications.       Relevant Medications   simvastatin (ZOCOR) 40 MG tablet   Other Relevant Orders   Bayer DCA Hb A1c Waived   Comprehensive metabolic panel   Urinalysis, Routine w reflex microscopic     Endocrine   Diabetes mellitus without complication ([place])    Discussed diabetes continued poor control intolerance to Victoza. May consider Victoza in the future as can use lower  doses and slower dosing intervals. For now patient's going to work on lifestyle factors with weight loss and exercise.      Relevant Medications   canagliflozin (INVOKANA) 300 MG TABS tablet   metFORMIN (GLUCOPHAGE) 500 MG tablet   simvastatin (ZOCOR) 40 MG tablet   Other Relevant Orders   Bayer DCA Hb A1c Waived   Comprehensive metabolic panel     Other   Hyperlipidemia    The current medical regimen is effective;  continue present plan and medications.       Relevant Medications   simvastatin (ZOCOR) 40 MG tablet   Other Relevant Orders   Lipid panel   Elevated alkaline phosphatase level   Relevant Orders   Bayer DCA Hb A1c Waived   Comprehensive metabolic panel   Advanced care planning/counseling discussion    A voluntary discussion about advance care planning including the explanation and discussion of advance directives was extensively discussed  with the patient.  Explanation about the health care proxy and Living will was reviewed and packet with forms with explanation of how to fill them out was given.  .  Patient was offered a separate [place] visit for further assistance with forms.          Other Visit Diagnoses    Annual physical exam    -  Primary   Thyroid disorder screen       Relevant Orders   CBC with Differential/Platelet   TSH     Loss of smell and taste Discussed allergy medicine nasal rinse and Flonase and if not helping consideration of ear nose and throat referral Numbness tingling in right arm pain attention to posture of her neck primarily and observe for symptoms if worsening will consider imaging studies etc.   Follow up plan: Return in about 3 months (around 03/19/2017) for Hemoglobin A1c.

## 2016-12-17 NOTE — Assessment & Plan Note (Signed)
A voluntary discussion about advance care planning including the explanation and discussion of advance directives was extensively discussed  with the patient.  Explanation about the health care proxy and Living will was reviewed and packet with forms with explanation of how to fill them out was given.  .  Patient was offered a separate Advance Care Planning visit for further assistance with forms.    

## 2016-12-18 ENCOUNTER — Encounter: Payer: Self-pay | Admitting: Family Medicine

## 2016-12-18 LAB — CBC WITH DIFFERENTIAL/PLATELET
BASOS ABS: 0 10*3/uL (ref 0.0–0.2)
Basos: 0 %
EOS (ABSOLUTE): 0.1 10*3/uL (ref 0.0–0.4)
Eos: 1 %
Hematocrit: 42.7 % (ref 34.0–46.6)
Hemoglobin: 13.7 g/dL (ref 11.1–15.9)
Immature Grans (Abs): 0 10*3/uL (ref 0.0–0.1)
Immature Granulocytes: 0 %
LYMPHS ABS: 2.8 10*3/uL (ref 0.7–3.1)
Lymphs: 28 %
MCH: 28.4 pg (ref 26.6–33.0)
MCHC: 32.1 g/dL (ref 31.5–35.7)
MCV: 88 fL (ref 79–97)
MONOCYTES: 5 %
MONOS ABS: 0.5 10*3/uL (ref 0.1–0.9)
NEUTROS PCT: 66 %
Neutrophils Absolute: 6.6 10*3/uL (ref 1.4–7.0)
Platelets: 330 10*3/uL (ref 150–379)
RBC: 4.83 x10E6/uL (ref 3.77–5.28)
RDW: 15 % (ref 12.3–15.4)
WBC: 10 10*3/uL (ref 3.4–10.8)

## 2016-12-18 LAB — COMPREHENSIVE METABOLIC PANEL
ALBUMIN: 4.6 g/dL (ref 3.5–4.8)
ALK PHOS: 105 IU/L (ref 39–117)
ALT: 20 IU/L (ref 0–32)
AST: 11 IU/L (ref 0–40)
Albumin/Globulin Ratio: 2.2 (ref 1.2–2.2)
BILIRUBIN TOTAL: 0.3 mg/dL (ref 0.0–1.2)
BUN / CREAT RATIO: 30 — AB (ref 12–28)
BUN: 28 mg/dL — AB (ref 8–27)
CHLORIDE: 101 mmol/L (ref 96–106)
CO2: 22 mmol/L (ref 18–29)
Calcium: 10.1 mg/dL (ref 8.7–10.3)
Creatinine, Ser: 0.92 mg/dL (ref 0.57–1.00)
GFR calc Af Amer: 72 mL/min/{1.73_m2} (ref >59)
GFR calc non Af Amer: 62 mL/min/{1.73_m2} (ref >59)
GLUCOSE: 166 mg/dL — AB (ref 65–99)
Globulin, Total: 2.1 g/dL (ref 1.5–4.5)
Potassium: 4.8 mmol/L (ref 3.5–5.2)
SODIUM: 139 mmol/L (ref 134–144)
Total Protein: 6.7 g/dL (ref 6.0–8.5)

## 2016-12-18 LAB — LIPID PANEL
CHOLESTEROL TOTAL: 181 mg/dL (ref 100–199)
Chol/HDL Ratio: 4.2 ratio (ref 0.0–4.4)
HDL: 43 mg/dL (ref >39)
TRIGLYCERIDES: 561 mg/dL — AB (ref 0–149)

## 2016-12-18 LAB — TSH: TSH: 1.58 u[IU]/mL (ref 0.450–4.500)

## 2016-12-19 DIAGNOSIS — E119 Type 2 diabetes mellitus without complications: Secondary | ICD-10-CM | POA: Diagnosis not present

## 2016-12-19 LAB — HM DIABETES EYE EXAM

## 2017-03-02 DIAGNOSIS — R69 Illness, unspecified: Secondary | ICD-10-CM | POA: Diagnosis not present

## 2017-03-10 DIAGNOSIS — S90561A Insect bite (nonvenomous), right ankle, initial encounter: Secondary | ICD-10-CM | POA: Diagnosis not present

## 2017-03-10 DIAGNOSIS — R21 Rash and other nonspecific skin eruption: Secondary | ICD-10-CM | POA: Diagnosis not present

## 2017-03-10 DIAGNOSIS — S90562A Insect bite (nonvenomous), left ankle, initial encounter: Secondary | ICD-10-CM | POA: Diagnosis not present

## 2017-04-14 ENCOUNTER — Ambulatory Visit: Payer: Medicare HMO | Admitting: Family Medicine

## 2017-04-27 ENCOUNTER — Encounter: Payer: Self-pay | Admitting: Family Medicine

## 2017-04-27 ENCOUNTER — Ambulatory Visit (INDEPENDENT_AMBULATORY_CARE_PROVIDER_SITE_OTHER): Payer: Medicare HMO | Admitting: Family Medicine

## 2017-04-27 VITALS — BP 130/60 | HR 86 | Wt 213.0 lb

## 2017-04-27 DIAGNOSIS — I10 Essential (primary) hypertension: Secondary | ICD-10-CM | POA: Diagnosis not present

## 2017-04-27 DIAGNOSIS — E119 Type 2 diabetes mellitus without complications: Secondary | ICD-10-CM

## 2017-04-27 MED ORDER — SEMAGLUTIDE(0.25 OR 0.5MG/DOS) 2 MG/1.5ML ~~LOC~~ SOPN: 0.5000 mg | PEN_INJECTOR | SUBCUTANEOUS | 12 refills | Status: DC

## 2017-04-27 NOTE — Assessment & Plan Note (Signed)
The current medical regimen is effective;  continue present plan and medications.  

## 2017-04-27 NOTE — Assessment & Plan Note (Signed)
Discussed diabetes long-term poor control discuss referrals discuss other medications patient reluctant to do anything wants to have 3 more months to achieve better control. Reviewed chart patient has had multiple just 3 more months and has not improved glucose. Will start Ozempic 0.25 patient K first dose here in the office will give weekly x 4. Then increased to 0.5

## 2017-04-27 NOTE — Progress Notes (Signed)
BP 130/60 (BP Location: Left Arm)   Pulse 86   Wt 213 lb (96.6 kg)   SpO2 98%   BMI 37.43 kg/m    Subjective:    Patient ID: Alyssa Mayo, female    DOB: 11-25-1945, 71 y.o.   MRN: [id number]  HPI: Alyssa Mayo is a 71 y.o. female  Chief Complaint  Patient presents with  . Diabetes  . Hypertension   Patient follow-up diabetes hypertension no problems with medications. Is concerned as she's been gaining weight insdead of losing weight. Taking medications faithfully without problems noted low blood sugar spells Relevant past medical, surgical, family and social history reviewed and updated as indicated. Interim medical history since our last visit reviewed. Allergies and medications reviewed and updated.  Review of Systems  Constitutional: Negative.   Respiratory: Negative.   Cardiovascular: Negative.     Per HPI unless specifically indicated above     Objective:    BP 130/60 (BP Location: Left Arm)   Pulse 86   Wt 213 lb (96.6 kg)   SpO2 98%   BMI 37.43 kg/m   Wt Readings from Last 3 Encounters:  04/27/17 213 lb (96.6 kg)  12/17/16 206 lb (93.4 kg)  11/13/16 206 lb (93.4 kg)    Physical Exam  Constitutional: She is oriented to person, place, and time. She appears well-developed and well-nourished.  HENT:  Head: Normocephalic and atraumatic.  Eyes: Conjunctivae and EOM are normal.  Neck: Normal range of motion.  Cardiovascular: Normal rate, regular rhythm and normal heart sounds.   Pulmonary/Chest: Effort normal and breath sounds normal.  Musculoskeletal: Normal range of motion.  Neurological: She is alert and oriented to person, place, and time.  Skin: No erythema.  Psychiatric: She has a normal mood and affect. Her behavior is normal. Judgment and thought content normal.    Results for orders placed or performed in visit on 12/17/16  Microscopic Examination  Result Value Ref Range   WBC, UA None seen 0 - 5 /hpf   RBC, UA None seen 0 - 2 /hpf    Epithelial Cells (non renal) CANCELED    Bacteria, UA None seen None seen/Few  Bayer DCA Hb A1c Waived  Result Value Ref Range   Bayer DCA Hb A1c Waived 7.5 (H) <7.0 %  Comprehensive metabolic panel  Result Value Ref Range   Glucose 166 (H) 65 - 99 mg/dL   BUN 28 (H) 8 - 27 mg/dL   Creatinine, Ser 9.56 0.57 - 1.00 mg/dL   GFR calc non Af Amer 62 >59 mL/min/1.73   GFR calc Af Amer 72 >59 mL/min/1.73   BUN/Creatinine Ratio 30 (H) 12 - 28   Sodium 139 134 - 144 mmol/L   Potassium 4.8 3.5 - 5.2 mmol/L   Chloride 101 96 - 106 mmol/L   CO2 22 18 - 29 mmol/L   Calcium 10.1 8.7 - 10.3 mg/dL   Total Protein 6.7 6.0 - 8.5 g/dL   Albumin 4.6 3.5 - 4.8 g/dL   Globulin, Total 2.1 1.5 - 4.5 g/dL   Albumin/Globulin Ratio 2.2 1.2 - 2.2   Bilirubin Total 0.3 0.0 - 1.2 mg/dL   Alkaline Phosphatase 105 39 - 117 IU/L   AST 11 0 - 40 IU/L   ALT 20 0 - 32 IU/L  CBC with Differential/Platelet  Result Value Ref Range   WBC 10.0 3.4 - 10.8 x10E3/uL   RBC 4.83 3.77 - 5.28 x10E6/uL   Hemoglobin 13.7 11.1 - 15.9  g/dL   Hematocrit 93.8 18.2 - 46.6 %   MCV 88 79 - 97 fL   MCH 28.4 26.6 - 33.0 pg   MCHC 32.1 31.5 - 35.7 g/dL   RDW 99.3 71.6 - 96.7 %   Platelets 330 150 - 379 x10E3/uL   Neutrophils 66 Not Estab. %   Lymphs 28 Not Estab. %   Monocytes 5 Not Estab. %   Eos 1 Not Estab. %   Basos 0 Not Estab. %   Neutrophils Absolute 6.6 1.4 - 7.0 x10E3/uL   Lymphocytes Absolute 2.8 0.7 - 3.1 x10E3/uL   Monocytes Absolute 0.5 0.1 - 0.9 x10E3/uL   EOS (ABSOLUTE) 0.1 0.0 - 0.4 x10E3/uL   Basophils Absolute 0.0 0.0 - 0.2 x10E3/uL   Immature Granulocytes 0 Not Estab. %   Immature Grans (Abs) 0.0 0.0 - 0.1 x10E3/uL  TSH  Result Value Ref Range   TSH 1.580 0.450 - 4.500 uIU/mL  Urinalysis, Routine w reflex microscopic  Result Value Ref Range   Specific Gravity, UA 1.010 1.005 - 1.030   pH, UA 5.0 5.0 - 7.5   Color, UA Yellow Yellow   Appearance Ur Clear Clear   Leukocytes, UA Negative Negative    Protein, UA Negative Negative/Trace   Glucose, UA 3+ (A) Negative   Ketones, UA Negative Negative   RBC, UA Negative Negative   Bilirubin, UA Negative Negative   Urobilinogen, Ur 0.2 0.2 - 1.0 mg/dL   Nitrite, UA Negative Negative   Microscopic Examination See below:   Lipid panel  Result Value Ref Range   Cholesterol, Total 181 100 - 199 mg/dL   Triglycerides 893 (HH) 0 - 149 mg/dL   HDL 43 >81 mg/dL   VLDL Cholesterol Cal Comment 5 - 40 mg/dL   LDL Calculated Comment 0 - 99 mg/dL   Chol/HDL Ratio 4.2 0.0 - 4.4 ratio      Assessment & Plan:   Problem List Items Addressed This Visit      Cardiovascular and Mediastinum   Essential hypertension - Primary    The current medical regimen is effective;  continue present plan and medications.       Relevant Orders   Bayer DCA Hb A1c Waived     Endocrine   Diabetes mellitus without complication (HCC)    Discussed diabetes long-term poor control discuss referrals discuss other medications patient reluctant to do anything wants to have 3 more months to achieve better control. Reviewed chart patient has had multiple just 3 more months and has not improved glucose. Will start Ozempic 0.25 patient K first dose here in the office will give weekly x 4. Then increased to 0.5      Relevant Medications   Semaglutide (OZEMPIC) 0.25 or 0.5 MG/DOSE SOPN   Other Relevant Orders   Bayer DCA Hb A1c Waived       Follow up plan: Return in about 3 months (around 07/28/2017) for Hemoglobin A1c.

## 2017-04-28 LAB — BAYER DCA HB A1C WAIVED: HB A1C (BAYER DCA - WAIVED): 8 % — ABNORMAL HIGH (ref <7.0)

## 2017-05-14 DIAGNOSIS — R69 Illness, unspecified: Secondary | ICD-10-CM | POA: Diagnosis not present

## 2017-06-26 DIAGNOSIS — G4733 Obstructive sleep apnea (adult) (pediatric): Secondary | ICD-10-CM | POA: Diagnosis not present

## 2017-08-03 ENCOUNTER — Ambulatory Visit: Payer: Medicare HMO | Admitting: Family Medicine

## 2017-08-04 DIAGNOSIS — R69 Illness, unspecified: Secondary | ICD-10-CM | POA: Diagnosis not present

## 2017-08-07 ENCOUNTER — Encounter: Payer: Self-pay | Admitting: Unknown Physician Specialty

## 2017-08-07 ENCOUNTER — Ambulatory Visit: Payer: Medicare HMO | Admitting: Unknown Physician Specialty

## 2017-08-07 VITALS — BP 138/82 | HR 92 | Temp 98.4°F | Wt 208.8 lb

## 2017-08-07 DIAGNOSIS — H10023 Other mucopurulent conjunctivitis, bilateral: Secondary | ICD-10-CM

## 2017-08-07 MED ORDER — POLYMYXIN B-TRIMETHOPRIM 10000-0.1 UNIT/ML-% OP SOLN: 1.0000 [drp] | OPHTHALMIC | 0 refills | Status: DC

## 2017-08-07 NOTE — Progress Notes (Signed)
BP 138/82   Pulse 92   Temp 98.4 F (36.9 C) (Oral)   Wt 208 lb 12.8 oz (94.7 kg)   SpO2 98%   BMI 36.70 kg/m    Subjective:    Patient ID: Alyssa Mayo, female    DOB: April 12, 1946, 72 y.o.   MRN: [id number]  HPI: Alyssa Mayo is a 72 y.o. female  Chief Complaint  Patient presents with  . Conjunctivitis    pt states she woke up with pink/red eyes this morning. States her grandson had pink eye last week    Conjunctivitis   The current episode started today. The problem has been unchanged. The problem is mild. Associated symptoms include eye redness. Pertinent negatives include no orthopnea, no fever, no decreased vision, no double vision, no eye itching, no photophobia, no abdominal pain, no congestion, no ear discharge, no ear pain, no headaches, no hearing loss, no mouth sores, no rhinorrhea, no sore throat, no stridor, no swollen glands, no muscle aches, no cough, no URI, no wheezing, no eye discharge and no eye pain. Both eyes are affected.   Relevant past medical, surgical, family and social history reviewed and updated as indicated. Interim medical history since our last visit reviewed. Allergies and medications reviewed and updated.  Review of Systems  Constitutional: Negative for fever.  HENT: Negative for congestion, ear discharge, ear pain, hearing loss, mouth sores, rhinorrhea and sore throat.   Eyes: Positive for redness. Negative for double vision, photophobia, pain, discharge and itching.  Respiratory: Negative for cough, wheezing and stridor.   Cardiovascular: Negative for orthopnea.  Gastrointestinal: Negative for abdominal pain.  Neurological: Negative for headaches.    Per HPI unless specifically indicated above     Objective:    BP 138/82   Pulse 92   Temp 98.4 F (36.9 C) (Oral)   Wt 208 lb 12.8 oz (94.7 kg)   SpO2 98%   BMI 36.70 kg/m   Wt Readings from Last 3 Encounters:  08/07/17 208 lb 12.8 oz (94.7 kg)  04/27/17 213 lb (96.6 kg)   12/17/16 206 lb (93.4 kg)    Physical Exam  Constitutional: She is oriented to person, place, and time. She appears well-developed and well-nourished. No distress.  HENT:  Head: Normocephalic and atraumatic.  Eyes: Conjunctivae and lids are normal. Right eye exhibits no discharge. Left eye exhibits no discharge. No scleral icterus.  Cardiovascular: Normal rate.  Pulmonary/Chest: Effort normal.  Abdominal: Normal appearance. There is no splenomegaly or hepatomegaly.  Musculoskeletal: Normal range of motion.  Neurological: She is alert and oriented to person, place, and time.  Skin: Skin is intact. No rash noted. No pallor.  Psychiatric: She has a normal mood and affect. Her behavior is normal. Judgment and thought content normal.    Results for orders placed or performed in visit on 04/27/17  Bayer DCA Hb A1c Waived  Result Value Ref Range   Bayer DCA Hb A1c Waived 8.0 (H) <7.0 %      Assessment & Plan:   Problem List Items Addressed This Visit    None    Visit Diagnoses    Pink eye disease of both eyes    -  Primary   Visiting grandchildren and nervous with pink eyes this AM.  Will rx for polytrim to take if worsening.  Does not appear infectious at this time.         Follow up plan: Return if symptoms worsen or fail to improve.

## 2017-08-31 ENCOUNTER — Encounter: Payer: Self-pay | Admitting: Family Medicine

## 2017-08-31 ENCOUNTER — Ambulatory Visit (INDEPENDENT_AMBULATORY_CARE_PROVIDER_SITE_OTHER): Payer: Medicare HMO | Admitting: Family Medicine

## 2017-08-31 VITALS — BP 138/77 | HR 89 | Wt 206.0 lb

## 2017-08-31 DIAGNOSIS — E119 Type 2 diabetes mellitus without complications: Secondary | ICD-10-CM

## 2017-08-31 DIAGNOSIS — E782 Mixed hyperlipidemia: Secondary | ICD-10-CM

## 2017-08-31 DIAGNOSIS — I10 Essential (primary) hypertension: Secondary | ICD-10-CM

## 2017-08-31 LAB — BAYER DCA HB A1C WAIVED: HB A1C: 8 % — AB (ref <7.0)

## 2017-08-31 NOTE — Progress Notes (Signed)
   BP 138/77   Pulse 89   Wt 206 lb (93.4 kg)   SpO2 98%   BMI 36.20 kg/m    Subjective:    Patient ID: Alyssa Mayo, female    DOB: [birth year], 72 y.o.   MRN: [id number]  HPI: Alyssa Mayo is a 72 y.o. female  Chief Complaint  Patient presents with  . Follow-up  . Diabetes  Patient follow-up diabetes unable to take Ozempic because he broke out in hives with the shot.  Patient's been reluctant to try other medications has been thinking about and wanting to do more exercise and weight loss but just has not been able to. Patient's been poorly controlled long-term. Patient also struggled with triglycerides being elevated along with poor control of glucose. Blood pressure controlled for now not on any medication for renal protection with diabetes will consider later.  Relevant past medical, surgical, family and social history reviewed and updated as indicated. Interim medical history since our last visit reviewed. Allergies and medications reviewed and updated.  Review of Systems  Constitutional: Negative.   Respiratory: Negative.   Cardiovascular: Negative.     Per HPI unless specifically indicated above     Objective:    BP 138/77   Pulse 89   Wt 206 lb (93.4 kg)   SpO2 98%   BMI 36.20 kg/m   Wt Readings from Last 3 Encounters:  08/31/17 206 lb (93.4 kg)  08/07/17 208 lb 12.8 oz (94.7 kg)  04/27/17 213 lb (96.6 kg)    Physical Exam  Constitutional: She is oriented to person, place, and time. She appears well-developed and well-nourished.  HENT:  Head: Normocephalic and atraumatic.  Eyes: Conjunctivae and EOM are normal.  Neck: Normal range of motion.  Cardiovascular: Normal rate, regular rhythm and normal heart sounds.  Pulmonary/Chest: Effort normal and breath sounds normal.  Musculoskeletal: Normal range of motion.  Neurological: She is alert and oriented to person, place, and time.  Skin: No erythema.  Psychiatric: She has a normal mood and  affect. Her behavior is normal. Judgment and thought content normal.    Results for orders placed or performed in visit on 04/27/17  Bayer DCA Hb A1c Waived  Result Value Ref Range   Bayer DCA Hb A1c Waived 8.0 (H) <7.0 %      Assessment & Plan:   Problem List Items Addressed This Visit      Cardiovascular and Mediastinum   Essential hypertension - Primary    No meds for now      Relevant Orders   Bayer DCA Hb A1c Waived     Endocrine   Diabetes mellitus without complication (HCC)    Continued poor control of diabetes.  Unable to take Ozempic.  Because of continued poor control will refer to endocrinology to further evaluate and treat.      Relevant Orders   Bayer DCA Hb A1c Waived   Ambulatory referral to Endocrinology     Other   Hyperlipidemia    The current medical regimen is effective;  continue present plan and medications.       Relevant Orders   Bayer DCA Hb A1c Waived       Follow up plan: Return for Physical Exam, May.

## 2017-08-31 NOTE — Assessment & Plan Note (Signed)
The current medical regimen is effective;  continue present plan and medications.  

## 2017-08-31 NOTE — Assessment & Plan Note (Signed)
Continued poor control of diabetes.  Unable to take Ozempic.  Because of continued poor control will refer to endocrinology to further evaluate and treat.

## 2017-08-31 NOTE — Assessment & Plan Note (Addendum)
No meds for now 

## 2017-09-22 ENCOUNTER — Ambulatory Visit: Payer: Self-pay | Admitting: *Deleted

## 2017-09-22 NOTE — Telephone Encounter (Signed)
Pt called with chest and nasal congestion. She states that this started on Saturday. She did have a fever on Saturday and also chills but not now. She has a productive cough and runny nose and states everything hurts but her her head. She is taking Alka Seltzer Plus but states it is not working as well has it did before. Pt advised to be careful with taking meds with Tylenol and ASA in it so has not to mask any fever. Pt voiced understanding.  An appointment already made for her.  Home care advice given with verbal understanding.  Reason for Disposition . [1] Using nasal washes and pain medicine > 24 hours AND [2] sinus pain (around cheekbone or eye) persists  Answer Assessment - Initial Assessment Questions 1. WORST SYMPTOM: "What is your worst symptom?" (e.g., cough, runny nose, muscle aches, headache, sore throat, fever)      Body aches 2. ONSET: "When did your flu symptoms start?"      Started on saturday 3. COUGH: "How bad is the cough?"       bad 4. RESPIRATORY DISTRESS: "Describe your breathing."      Breath easier lying down than sitting up 5. FEVER: "Do you have a fever?" If so, ask: "What is your temperature, how was it measured, and when did it start?"     Fever over the weekend not now 6. EXPOSURE: "Were you exposed to someone with influenza?"       Not sure, someone at church was sick 7. FLU VACCINE: "Did you get a flu shot this year?"     no 8. HIGH RISK DISEASE: "Do you any chronic medical problems?" (e.g., heart or lung disease, asthma, weak immune system, or other HIGH RISK conditions)     No, just diabetes  9. PREGNANCY: "Is there any chance you are pregnant?" "When was your last menstrual period?"     no 10. OTHER SYMPTOMS: "Do you have any other symptoms?"  (e.g., runny nose, muscle aches, headache, sore throat)       Runny nose, muscles aches, a little sore throat,  Protocols used: INFLUENZA - SEASONAL-A-AH

## 2017-09-23 ENCOUNTER — Ambulatory Visit: Payer: Medicare HMO | Admitting: Family Medicine

## 2017-09-23 ENCOUNTER — Encounter: Payer: Self-pay | Admitting: Family Medicine

## 2017-09-23 VITALS — BP 167/77 | HR 97 | Temp 98.5°F | Wt 209.3 lb

## 2017-09-23 DIAGNOSIS — J101 Influenza due to other identified influenza virus with other respiratory manifestations: Secondary | ICD-10-CM

## 2017-09-23 DIAGNOSIS — R6889 Other general symptoms and signs: Secondary | ICD-10-CM | POA: Diagnosis not present

## 2017-09-23 LAB — VERITOR FLU A/B WAIVED
INFLUENZA A: POSITIVE — AB
INFLUENZA B: NEGATIVE

## 2017-09-23 MED ORDER — BALOXAVIR MARBOXIL(80 MG DOSE) 2 X 40 MG PO TBPK: 80.0000 mg | ORAL_TABLET | Freq: Once | ORAL | 0 refills | Status: AC

## 2017-09-23 MED ORDER — HYDROCOD POLST-CPM POLST ER 10-8 MG/5ML PO SUER: 5.0000 mL | Freq: Two times a day (BID) | ORAL | 0 refills | Status: DC | PRN

## 2017-09-23 NOTE — Progress Notes (Signed)
   BP (!) 167/77   Pulse 97   Temp 98.5 F (36.9 C) (Oral)   Wt 209 lb 4.8 oz (94.9 kg)   SpO2 95%   BMI 36.78 kg/m    Subjective:    Patient ID: Alyssa Mayo, female    DOB: 13-Jan-1946, 72 y.o.   MRN: [id number]  HPI: Alyssa Mayo is a 72 y.o. female  Chief Complaint  Patient presents with  . URI    Started Friday afternoon, cough, congestion nasal and chest   Congestion, productive cough, sneezing, fevers, severe generalized body aches x 4-5 days. Trying alka seltzer cold and flu with good temporary relief. Denies CP, SOB, N/V/D. Several sick contacts. Refused flu vaccine this year.   Relevant past medical, surgical, family and social history reviewed and updated as indicated. Interim medical history since our last visit reviewed. Allergies and medications reviewed and updated.  Review of Systems  Per HPI unless specifically indicated above     Objective:    BP (!) 167/77   Pulse 97   Temp 98.5 F (36.9 C) (Oral)   Wt 209 lb 4.8 oz (94.9 kg)   SpO2 95%   BMI 36.78 kg/m   Wt Readings from Last 3 Encounters:  09/23/17 209 lb 4.8 oz (94.9 kg)  08/31/17 206 lb (93.4 kg)  08/07/17 208 lb 12.8 oz (94.7 kg)    Physical Exam  Constitutional: She is oriented to person, place, and time. She appears well-developed and well-nourished. No distress.  HENT:  Head: Atraumatic.  Right Ear: External ear normal.  Left Ear: External ear normal.  Nasal mucosa and oropharynx erythematous, rhinorrhea present  Eyes: Conjunctivae are normal. Pupils are equal, round, and reactive to light.  Neck: Normal range of motion. Neck supple.  Cardiovascular: Normal rate and normal heart sounds.  Pulmonary/Chest: Effort normal and breath sounds normal. No respiratory distress. She has no wheezes. She has no rales.  Musculoskeletal: Normal range of motion.  Neurological: She is alert and oriented to person, place, and time.  Skin: Skin is warm and dry.  Psychiatric: She has a  normal mood and affect. Her behavior is normal.  Nursing note and vitals reviewed.   Results for orders placed or performed in visit on 09/23/17  Veritor Flu A/B Waived  Result Value Ref Range   Influenza A Positive (A) Negative   Influenza B Negative Negative      Assessment & Plan:   Problem List Items Addressed This Visit    None    Visit Diagnoses    Influenza A    -  Primary   Sxs beginning to improve, good relief with OTC remedies. Pt opting to start xofluza out of window. Sample given today, along with risks and benefits of med   Relevant Orders   Veritor Flu A/B Waived (Completed)    Supportive care and return precautions reviewed at length   Follow up plan: Return if symptoms worsen or fail to improve.

## 2017-09-25 NOTE — Patient Instructions (Signed)
Follow up as needed

## 2017-10-07 DIAGNOSIS — R69 Illness, unspecified: Secondary | ICD-10-CM | POA: Diagnosis not present

## 2017-10-08 DIAGNOSIS — E1165 Type 2 diabetes mellitus with hyperglycemia: Secondary | ICD-10-CM | POA: Diagnosis not present

## 2017-10-08 DIAGNOSIS — E1140 Type 2 diabetes mellitus with diabetic neuropathy, unspecified: Secondary | ICD-10-CM | POA: Diagnosis not present

## 2017-10-08 DIAGNOSIS — E118 Type 2 diabetes mellitus with unspecified complications: Secondary | ICD-10-CM | POA: Diagnosis not present

## 2017-11-30 DIAGNOSIS — R69 Illness, unspecified: Secondary | ICD-10-CM | POA: Diagnosis not present

## 2017-12-10 ENCOUNTER — Ambulatory Visit (INDEPENDENT_AMBULATORY_CARE_PROVIDER_SITE_OTHER): Payer: Medicare HMO

## 2017-12-10 VITALS — BP 132/74 | HR 96 | Temp 98.1°F | Resp 16 | Ht 64.0 in | Wt 212.6 lb

## 2017-12-10 DIAGNOSIS — Z0000 Encounter for general adult medical examination without abnormal findings: Secondary | ICD-10-CM | POA: Diagnosis not present

## 2017-12-10 NOTE — Patient Instructions (Addendum)
Alyssa Mayo , Thank you for taking time to come for your Medicare Wellness Visit. I appreciate your ongoing commitment to your health goals. Please review the following plan we discussed and let me know if I can assist you in the future.   Screening recommendations/referrals: Colonoscopy: completed 11/72014 Mammogram: completed 01/01/2016 Please call [phone] to schedule your mammogram.  Bone Density: completed 10/14/2010 Recommended yearly ophthalmology/optometry visit for glaucoma screening and checkup Recommended yearly dental visit for hygiene and checkup  Vaccinations: Influenza vaccine: up to date Pneumococcal vaccine: up to date Tdap vaccine: up to date  Shingles vaccine: up to date    Advanced directives: Advance directive discussed with you today. Even though you declined this today please call our office should you change your mind and we can give you the proper paperwork for you to fill out.  Conditions/risks identified: Recommend drinking at least 6-8 glasses of water a day   Next appointment: Follow up on 6//2019 at 10:00am with Dr.Crissman. Follow up in one year for your annual wellness exam.    Preventive Care 65 Years and Older, Female Preventive care refers to lifestyle choices and visits with your health care provider that can promote health and wellness. What does preventive care include?  A yearly physical exam. This is also called an annual well check.  Dental exams once or twice a year.  Routine eye exams. Ask your health care provider how often you should have your eyes checked.  Personal lifestyle choices, including:  Daily care of your teeth and gums.  Regular physical activity.  Eating a healthy diet.  Avoiding tobacco and drug use.  Limiting alcohol use.  Practicing safe sex.  Taking low-dose aspirin every day.  Taking vitamin and mineral supplements as recommended by your health care provider. What happens during an annual well  check? The services and screenings done by your health care provider during your annual well check will depend on your age, overall health, lifestyle risk factors, and family history of disease. Counseling  Your health care provider may ask you questions about your:  Alcohol use.  Tobacco use.  Drug use.  Emotional well-being.  Home and relationship well-being.  Sexual activity.  Eating habits.  History of falls.  Memory and ability to understand (cognition).  Work and work Astronomer.  Reproductive health. Screening  You may have the following tests or measurements:  Height, weight, and BMI.  Blood pressure.  Lipid and cholesterol levels. These may be checked every 5 years, or more frequently if you are over 78 years old.  Skin check.  Lung cancer screening. You may have this screening every year starting at age 44 if you have a 30-pack-year history of smoking and currently smoke or have quit within the past 15 years.  Fecal occult blood test (FOBT) of the stool. You may have this test every year starting at age 24.  Flexible sigmoidoscopy or colonoscopy. You may have a sigmoidoscopy every 5 years or a colonoscopy every 10 years starting at age 62.  Hepatitis C blood test.  Hepatitis B blood test.  Sexually transmitted disease (STD) testing.  Diabetes screening. This is done by checking your blood sugar (glucose) after you have not eaten for a while (fasting). You may have this done every 1-3 years.  Bone density scan. This is done to screen for osteoporosis. You may have this done starting at age 34.  Mammogram. This may be done every 1-2 years. Talk to your health care provider about how  often you should have regular mammograms. Talk with your health care provider about your test results, treatment options, and if necessary, the need for more tests. Vaccines  Your health care provider may recommend certain vaccines, such as:  Influenza vaccine. This is  recommended every year.  Tetanus, diphtheria, and acellular pertussis (Tdap, Td) vaccine. You may need a Td booster every 10 years.  Zoster vaccine. You may need this after age 85.  Pneumococcal 13-valent conjugate (PCV13) vaccine. One dose is recommended after age 102.  Pneumococcal polysaccharide (PPSV23) vaccine. One dose is recommended after age 57. Talk to your health care provider about which screenings and vaccines you need and how often you need them. This information is not intended to replace advice given to you by your health care provider. Make sure you discuss any questions you have with your health care provider. Document Released: 08/03/2015 Document Revised: 03/26/2016 Document Reviewed: 05/08/2015 Elsevier Interactive Patient Education  2017 [place] Prevention in the Home Falls can cause injuries. They can happen to people of all ages. There are many things you can do to make your home safe and to help prevent falls. What can I do on the outside of my home?  Regularly fix the edges of walkways and driveways and fix any cracks.  Remove anything that might make you trip as you walk through a door, such as a raised step or threshold.  Trim any bushes or trees on the path to your home.  Use bright outdoor lighting.  Clear any walking paths of anything that might make someone trip, such as rocks or tools.  Regularly check to see if handrails are loose or broken. Make sure that both sides of any steps have handrails.  Any raised decks and porches should have guardrails on the edges.  Have any leaves, snow, or ice cleared regularly.  Use sand or salt on walking paths during winter.  Clean up any spills in your garage right away. This includes oil or grease spills. What can I do in the bathroom?  Use night lights.  Install grab bars by the toilet and in the tub and shower. Do not use towel bars as grab bars.  Use non-skid mats or decals in the tub or  shower.  If you need to sit down in the shower, use a plastic, non-slip stool.  Keep the floor dry. Clean up any water that spills on the floor as soon as it happens.  Remove soap buildup in the tub or shower regularly.  Attach bath mats securely with double-sided non-slip rug tape.  Do not have throw rugs and other things on the floor that can make you trip. What can I do in the bedroom?  Use night lights.  Make sure that you have a light by your bed that is easy to reach.  Do not use any sheets or blankets that are too big for your bed. They should not hang down onto the floor.  Have a firm chair that has side arms. You can use this for support while you get dressed.  Do not have throw rugs and other things on the floor that can make you trip. What can I do in the kitchen?  Clean up any spills right away.  Avoid walking on wet floors.  Keep items that you use a lot in easy-to-reach places.  If you need to reach something above you, use a strong step stool that has a grab bar.  Keep electrical  cords out of the way.  Do not use floor polish or wax that makes floors slippery. If you must use wax, use non-skid floor wax.  Do not have throw rugs and other things on the floor that can make you trip. What can I do with my stairs?  Do not leave any items on the stairs.  Make sure that there are handrails on both sides of the stairs and use them. Fix handrails that are broken or loose. Make sure that handrails are as long as the stairways.  Check any carpeting to make sure that it is firmly attached to the stairs. Fix any carpet that is loose or worn.  Avoid having throw rugs at the top or bottom of the stairs. If you do have throw rugs, attach them to the floor with carpet tape.  Make sure that you have a light switch at the top of the stairs and the bottom of the stairs. If you do not have them, ask someone to add them for you. What else can I do to help prevent  falls?  Wear shoes that:  Do not have high heels.  Have rubber bottoms.  Are comfortable and fit you well.  Are closed at the toe. Do not wear sandals.  If you use a stepladder:  Make sure that it is fully opened. Do not climb a closed stepladder.  Make sure that both sides of the stepladder are locked into place.  Ask someone to hold it for you, if possible.  Clearly mark and make sure that you can see:  Any grab bars or handrails.  First and last steps.  Where the edge of each step is.  Use tools that help you move around (mobility aids) if they are needed. These include:  Canes.  Walkers.  Scooters.  Crutches.  Turn on the lights when you go into a dark area. Replace any light bulbs as soon as they burn out.  Set up your furniture so you have a clear path. Avoid moving your furniture around.  If any of your floors are uneven, fix them.  If there are any pets around you, be aware of where they are.  Review your medicines with your doctor. Some medicines can make you feel dizzy. This can increase your chance of falling. Ask your doctor what other things that you can do to help prevent falls. This information is not intended to replace advice given to you by your health care provider. Make sure you discuss any questions you have with your health care provider.d Document Released: 05/03/2009 Document Revised: 12/13/2015 Document Reviewed: 08/11/2014 Elsevier Interactive Patient Education  2017 [employer].

## 2017-12-10 NOTE — Progress Notes (Signed)
Subjective:   Alyssa Mayo is a 72 y.o. female who presents for Medicare Annual (Subsequent) preventive examination.  Review of Systems:  Cardiac Risk Factors include: hypertension;advanced age (>53men, >57 women);diabetes mellitus;dyslipidemia;obesity (BMI >30kg/m2)     Objective:     Vitals: BP 132/74 (BP Location: Left Arm, Patient Position: Sitting)   Pulse 96   Temp 98.1 F (36.7 C) (Temporal)   Resp 16   Ht 5\' 4"  (1.626 m)   Wt 212 lb 9.6 oz (96.4 kg)   BMI 36.49 kg/m   Body mass index is 36.49 kg/m.  Advanced Directives 12/10/2017 11/03/2015  Does Patient Have a Medical Advance Directive? No No  Would patient like information on creating a medical advance directive? No - Patient declined No - patient declined information    Tobacco Social History   Tobacco Use  Smoking Status Never Smoker  Smokeless Tobacco Never Used     Counseling given: Not Answered   Clinical Intake:  Pre-visit preparation completed: Yes  Pain : 0-10 Pain Score: 1  Pain Type: Chronic pain Pain Location: Hand Pain Orientation: Right, Left Pain Radiating Towards: fingers radiating towards hands  Pain Descriptors / Indicators: Tingling, Aching Pain Onset: More than a month ago Pain Frequency: Constant     Nutritional Status: BMI > 30  Obese Nutritional Risks: None Diabetes: Yes CBG done?: No Did pt. bring in CBG monitor from home?: No  How often do you need to have someone help you when you read instructions, pamphlets, or other written materials from your doctor or pharmacy?: 1 - Never What is the last grade level you completed in school?: 4 years college   Interpreter Needed?: No  Information entered by :: Tiffany Hill,LPN   Past Medical History:  Diagnosis Date  . Arthritis   . Diabetes mellitus without complication (HCC)   . Elevated liver enzymes   . Fatty liver   . Hyperlipidemia   . Lifelong obesity   . Sleep apnea    Past Surgical History:  Procedure  Laterality Date  . ABDOMINAL HYSTERECTOMY    . APPENDECTOMY    . CERVICAL LAMINECTOMY  1987  . CESAREAN SECTION    . COLONOSCOPY  2014   diverticulosis, [facility] Dr. [name]   . SPINE SURGERY    . TUBAL LIGATION     Family History  Problem Relation Age of Onset  . Cancer Mother        breast  . Heart disease Mother   . Stroke Mother   . Diabetes Mother   . Cancer Father        leukemia  . Stroke Father   . Cancer Brother   . COPD Neg Hx   . Hypertension Neg Hx    Social History   Socioeconomic History  . Marital status: Married    Spouse name: Not on file  . Number of children: Not on file  . Years of education: Not on file  . Highest education level: Not on file  Occupational History  . Not on file  Social Needs  . Financial resource strain: Not hard at all  . Food insecurity:    Worry: Never true    Inability: Never true  . Transportation needs:    Medical: No    Non-medical: No  Tobacco Use  . Smoking status: Never Smoker  . Smokeless tobacco: Never Used  Substance and Sexual Activity  . Alcohol use: Yes    Alcohol/week: 0.0 oz  Comment: rare  . Drug use: No  . Sexual activity: Not on file  Lifestyle  . Physical activity:    Days per week: 0 days    Minutes per session: 0 min  . Stress: Not at all  Relationships  . Social connections:    Talks on phone: More than three times a week    Gets together: More than three times a week    Attends religious service: More than 4 times per year    Active member of club or organization: Yes    Attends meetings of clubs or organizations: More than 4 times per year    Relationship status: Married  Other Topics Concern  . Not on file  Social History Narrative  . Not on file    Outpatient Encounter Medications as of 12/10/2017  Medication Sig  . aspirin EC 81 MG tablet Take 1 tablet (81 mg total) by mouth daily. Take at least one hour BEFORE the Celebrex  . canagliflozin (INVOKANA) 300 MG TABS tablet Take 1  tablet (300 mg total) by mouth daily.  . celecoxib (CELEBREX) 200 MG capsule Take 1 capsule (200 mg total) by mouth daily.  Marland Kitchen gabapentin (NEURONTIN) 100 MG capsule   . glipiZIDE (GLUCOTROL XL) 5 MG 24 hr tablet Take by mouth.  Marland Kitchen glucose blood (ONE TOUCH ULTRA TEST) test strip   . metFORMIN (GLUCOPHAGE) 500 MG tablet Take 2 tablets (1,000 mg total) by mouth 2 (two) times daily.  . pantoprazole (PROTONIX) 40 MG tablet Take 1 tablet (40 mg total) by mouth daily.  . simvastatin (ZOCOR) 40 MG tablet Take 1 tablet (40 mg total) by mouth daily.  . chlorpheniramine-HYDROcodone (TUSSIONEX PENNKINETIC ER) 10-8 MG/5ML SUER Take 5 mLs by mouth every 12 (twelve) hours as needed. (Patient not taking: Reported on 12/10/2017)   No facility-administered encounter medications on file as of 12/10/2017.     Activities of Daily Living In your present state of health, do you have any difficulty performing the following activities: 12/10/2017  Hearing? Y  Vision? N  Difficulty concentrating or making decisions? N  Walking or climbing stairs? N  Dressing or bathing? N  Doing errands, shopping? N  Preparing Food and eating ? N  Using the Toilet? N  In the past six months, have you accidently leaked urine? N  Do you have problems with loss of bowel control? N  Managing your Medications? N  Managing your Finances? N  Housekeeping or managing your Housekeeping? N  Some recent data might be hidden    Patient Care Team: Steele Sizer, MD as PCP - General (Family Medicine) Lockie Mola, MD as Referring Physician (Ophthalmology) [name], MD as Consulting Physician (Gastroenterology) Deeann Saint, MD (Specialist) Bud Face, MD as Referring Physician (Otolaryngology) Lemar Livings Merrily Pew, MD as Consulting Physician (General Surgery) Kerman Passey, MD as Referring Physician (Family Medicine)    Assessment:   This is a routine wellness examination for [facility].  Exercise Activities  and Dietary recommendations Current Exercise Habits: The patient does not participate in regular exercise at present, Exercise limited by: None identified  Goals    . DIET - INCREASE WATER INTAKE     Recommend drinking at least 6-8 glasses of water a day        Fall Risk Fall Risk  12/10/2017 09/23/2017 08/31/2017 04/27/2017 12/17/2016  Falls in the past year? No No No No No  Number falls in past yr: - - - - -  Injury with  Fall? - - - - -   Is the patient's home free of loose throw rugs in walkways, pet beds, electrical cords, etc?   yes      Grab bars in the bathroom? no      Handrails on the stairs?   yes      Adequate lighting?   yes  Timed Get Up and Go performed: Completed in 8 seconds with no use of assistive devices, steady gait. No intervention needed at this time.   Depression Screen PHQ 2/9 Scores 12/10/2017 08/31/2017 04/27/2017 12/17/2016  PHQ - 2 Score 0 0 0 0     Cognitive Function     6CIT Screen 12/10/2017  What Year? 0 points  What month? 0 points  What time? 0 points  Count back from 20 0 points  Months in reverse 0 points  Repeat phrase 0 points  Total Score 0    Immunization History  Administered Date(s) Administered  . Influenza, High Dose Seasonal PF 06/09/2016  . Influenza,inj,Quad PF,6+ Mos 06/04/2015  . Influenza,inj,quad, With Preservative 06/09/2016  . Influenza-Unspecified 04/26/2014  . Pneumococcal Conjugate-13 11/15/2014  . Pneumococcal-Unspecified 05/02/2008, 10/25/2013, 07/21/2014  . Td 08/27/2005  . Tdap 05/20/2012  . Zoster 09/08/2016    Qualifies for Shingles Vaccine? Up to date   Screening Tests Health Maintenance  Topic Date Due  . URINE MICROALBUMIN  06/09/2017  . FOOT EXAM  09/08/2017  . OPHTHALMOLOGY EXAM  12/19/2017  . MAMMOGRAM  12/31/2017  . HEMOGLOBIN A1C  02/28/2018  . TETANUS/TDAP  05/20/2022  . COLONOSCOPY  05/28/2023  . DEXA SCAN  Completed  . PNA vac Low Risk Adult  Completed  . Hepatitis C Screening  Addressed    . INFLUENZA VACCINE  Discontinued    Cancer Screenings: Lung: Low Dose CT Chest recommended if Age 85-80 years, 30 pack-year currently smoking OR have quit w/in 15years. Patient does not qualify. Breast:  Up to date on Mammogram? Yes   Up to date of Bone Density/Dexa? Yes 10/14/2010 Colorectal: completed 05/27/2013  Additional Screenings:  Hepatitis C Screening: completed 05/03/2007     Plan:    I have personally reviewed and addressed the Medicare Annual Wellness questionnaire and have noted the following in the patient's chart:  A. Medical and social history B. Use of alcohol, tobacco or illicit drugs  C. Current medications and supplements D. Functional ability and status E.  Nutritional status F.  Physical activity G. Advance directives H. List of other physicians I.  Hospitalizations, surgeries, and ER visits in previous 12 months J.  Vitals K. Screenings such as hearing and vision if needed, cognitive and depression L. Referrals and appointments   In addition, I have reviewed and discussed with patient certain preventive protocols, quality metrics, and best practice recommendations. A written personalized care plan for preventive services as well as general preventive health recommendations were provided to patient.   Signed,  Marin Roberts, LPN Nurse Health Advisor   Nurse Notes: due for diabetic foot exam. CPE on 12/24/2017

## 2017-12-12 ENCOUNTER — Encounter: Payer: Self-pay | Admitting: Gynecology

## 2017-12-12 ENCOUNTER — Ambulatory Visit
Admission: EM | Admit: 2017-12-12 | Discharge: 2017-12-12 | Disposition: A | Discharge status: Home / Self Care | Payer: Medicare HMO | Attending: Family Medicine | Admitting: Family Medicine

## 2017-12-12 ENCOUNTER — Other Ambulatory Visit: Payer: Self-pay

## 2017-12-12 DIAGNOSIS — J029 Acute pharyngitis, unspecified: Secondary | ICD-10-CM

## 2017-12-12 DIAGNOSIS — B9789 Other viral agents as the cause of diseases classified elsewhere: Secondary | ICD-10-CM

## 2017-12-12 DIAGNOSIS — R05 Cough: Secondary | ICD-10-CM | POA: Diagnosis not present

## 2017-12-12 DIAGNOSIS — B880 Other acariasis: Secondary | ICD-10-CM

## 2017-12-12 DIAGNOSIS — J069 Acute upper respiratory infection, unspecified: Secondary | ICD-10-CM | POA: Diagnosis not present

## 2017-12-12 LAB — RAPID STREP SCREEN (MED CTR MEBANE ONLY): Streptococcus, Group A Screen (Direct): NEGATIVE

## 2017-12-12 NOTE — ED Triage Notes (Signed)
Patient c/o cough / sore throat and body ache x 3 days.

## 2017-12-12 NOTE — ED Provider Notes (Signed)
[facility]    CSN: [id number] Arrival date & time: 12/12/17  1027     History   Chief Complaint Chief Complaint  Patient presents with  . Cough  . Sore Throat  . Insect Bite    HPI Alyssa Mayo is a 72 y.o. female.   Also c/o rash to lower legs after working in yard.   The history is provided by the patient.  Cough  Associated symptoms: rhinorrhea and sore throat   Associated symptoms: no headaches and no wheezing   Sore Throat  Pertinent negatives include no headaches.  URI  Presenting symptoms: congestion, cough, rhinorrhea and sore throat   Severity:  Moderate Onset quality:  Sudden Duration:  3 days Timing:  Constant Progression:  Worsening Chronicity:  New Relieved by:  Nothing Ineffective treatments:  OTC medications Associated symptoms: no headaches and no wheezing   Risk factors: being elderly and diabetes mellitus   Risk factors: no chronic cardiac disease, no chronic kidney disease, no recent illness and no recent travel     Past Medical History:  Diagnosis Date  . Arthritis   . Diabetes mellitus without complication (HCC)   . Elevated liver enzymes   . Fatty liver   . Hyperlipidemia   . Lifelong obesity   . Sleep apnea     Patient Active Problem List   Diagnosis Date Noted  . Advanced care planning/counseling discussion 12/17/2016  . Skin lesions, generalized 11/13/2016  . Chronic right hip pain 06/17/2016  . Vitamin D deficiency 09/26/2015  . Diastasis recti 08/08/2015  . Elevated serum GGT level 07/24/2015  . Essential hypertension 07/17/2015  . Fatty liver 07/17/2015  . Elevated alkaline phosphatase level 07/17/2015  . Elevated serum glutamic pyruvic transaminase (SGPT) level 07/17/2015  . Abnormal finding on EKG 07/11/2015  . Morbid obesity (HCC) 07/11/2015  . Colon, diverticulosis 07/11/2015  . Abdominal wall hernia 07/11/2015  . Diabetes mellitus without complication (HCC)   . Hyperlipidemia     Past  Surgical History:  Procedure Laterality Date  . ABDOMINAL HYSTERECTOMY    . APPENDECTOMY    . CERVICAL LAMINECTOMY  1987  . CESAREAN SECTION    . COLONOSCOPY  2014   diverticulosis, [facility] Dr. [name]   . SPINE SURGERY    . TUBAL LIGATION      OB History   None      Home Medications    Prior to Admission medications   Medication Sig Start Date End Date Taking? Authorizing Provider  canagliflozin (INVOKANA) 300 MG TABS tablet Take 1 tablet (300 mg total) by mouth daily. 12/17/16  Yes Crissman, Redge Gainer, MD  celecoxib (CELEBREX) 200 MG capsule Take 1 capsule (200 mg total) by mouth daily. 12/17/16  Yes Crissman, Redge Gainer, MD  glipiZIDE (GLUCOTROL XL) 5 MG 24 hr tablet Take by mouth. 10/08/17 10/08/18 Yes [provider]  glucose blood (ONE TOUCH ULTRA TEST) test strip  08/07/15  Yes [provider]  metFORMIN (GLUCOPHAGE) 500 MG tablet Take 2 tablets (1,000 mg total) by mouth 2 (two) times daily. 12/17/16  Yes Crissman, Redge Gainer, MD  pantoprazole (PROTONIX) 40 MG tablet Take 1 tablet (40 mg total) by mouth daily. 12/17/16  Yes Crissman, Redge Gainer, MD  simvastatin (ZOCOR) 40 MG tablet Take 1 tablet (40 mg total) by mouth daily. 12/17/16  Yes Steele Sizer, MD  aspirin EC 81 MG tablet Take 1 tablet (81 mg total) by mouth daily. Take at least one hour BEFORE the  Celebrex 07/11/15   Lada, Janit Bern, MD  chlorpheniramine-HYDROcodone (TUSSIONEX PENNKINETIC ER) 10-8 MG/5ML SUER Take 5 mLs by mouth every 12 (twelve) hours as needed. Patient not taking: Reported on 12/10/2017 09/23/17   Particia Nearing, PA-C  gabapentin (NEURONTIN) 100 MG capsule  11/30/17   [provider]    Family History Family History  Problem Relation Age of Onset  . Cancer Mother        breast  . Heart disease Mother   . Stroke Mother   . Diabetes Mother   . Cancer Father        leukemia  . Stroke Father   . Cancer Brother   . COPD Neg Hx   . Hypertension Neg Hx     Social History Social  History   Tobacco Use  . Smoking status: Never Smoker  . Smokeless tobacco: Never Used  Substance Use Topics  . Alcohol use: Yes    Alcohol/week: 0.0 oz    Comment: rare  . Drug use: No     Allergies   Neosporin [neomycin-bacitracin zn-polymyx]; Ozempic [semaglutide]; and Sulfa antibiotics   Review of Systems Review of Systems  HENT: Positive for congestion, rhinorrhea and sore throat.   Respiratory: Positive for cough. Negative for wheezing.   Neurological: Negative for headaches.     Physical Exam Triage Vital Signs ED Triage Vitals  Enc Vitals Group     BP 12/12/17 1052 135/69     Pulse Rate 12/12/17 1052 91     Resp 12/12/17 1052 16     Temp 12/12/17 1052 98.2 F (36.8 C)     Temp Source 12/12/17 1052 Oral     SpO2 12/12/17 1052 95 %     Weight 12/12/17 1053 212 lb (96.2 kg)     Height --      Head Circumference --      Peak Flow --      Pain Score 12/12/17 1053 2     Pain Loc --      Pain Edu? --      Excl. in GC? --    No data found.  Updated Vital Signs BP 135/69 (BP Location: Left Arm)   Pulse 91   Temp 98.2 F (36.8 C) (Oral)   Resp 16   Wt 212 lb (96.2 kg)   SpO2 95%   BMI 36.39 kg/m   Visual Acuity Right Eye Distance:   Left Eye Distance:   Bilateral Distance:    Right Eye Near:   Left Eye Near:    Bilateral Near:     Physical Exam  Constitutional: She appears well-developed and well-nourished. No distress.  HENT:  Head: Normocephalic and atraumatic.  Right Ear: Tympanic membrane, external ear and ear canal normal.  Left Ear: Tympanic membrane, external ear and ear canal normal.  Nose: Mucosal edema and rhinorrhea present. No nose lacerations, sinus tenderness, nasal deformity, septal deviation or nasal septal hematoma. No epistaxis.  No foreign bodies. Right sinus exhibits no maxillary sinus tenderness and no frontal sinus tenderness. Left sinus exhibits no maxillary sinus tenderness and no frontal sinus tenderness.  Mouth/Throat:  Uvula is midline, oropharynx is clear and moist and mucous membranes are normal. No oropharyngeal exudate.  Eyes: Pupils are equal, round, and reactive to light. Conjunctivae and EOM are normal. Right eye exhibits no discharge. Left eye exhibits no discharge. No scleral icterus.  Neck: Normal range of motion. Neck supple. No thyromegaly present.  Cardiovascular: Normal rate, regular rhythm and  normal heart sounds.  Pulmonary/Chest: Effort normal and breath sounds normal. No stridor. No respiratory distress. She has no wheezes. She has no rales.  Lymphadenopathy:    She has no cervical adenopathy.  Skin: Rash (multiple, erythematous, papular lesions on lower ext) noted. She is not diaphoretic.  Nursing note and vitals reviewed.    UC Treatments / Results  Labs (all labs ordered are listed, but only abnormal results are displayed) Labs Reviewed  RAPID STREP SCREEN ([facility] ONLY)  CULTURE, GROUP A STREP [facility])    EKG None  Radiology No results found.  Procedures Procedures (including critical care time)  Medications Ordered in UC Medications - No data to display  Initial Impression / Assessment and Plan / UC Course  I have reviewed the triage vital signs and the nursing notes.  Pertinent labs & imaging results that were available during my care of the patient were reviewed by me and considered in my medical decision making (see chart for details).      Final Clinical Impressions(s) / UC Diagnoses   Final diagnoses:  Viral URI with cough  Chigger bites    ED Prescriptions    None     1. Lab results and diagnosis reviewed with patient 2. Recommend supportive treatment with rest, fluids, otc cold/cough meds, otc antihistamines 3. Follow-up prn if symptoms worsen or don't improve   Controlled Substance Prescriptions [place] Controlled Substance Registry consulted? Not Applicable   Payton Mccallum, MD 12/12/17 1218

## 2017-12-12 NOTE — Discharge Instructions (Addendum)
Over the counter antihistamines

## 2017-12-15 DIAGNOSIS — H6690 Otitis media, unspecified, unspecified ear: Secondary | ICD-10-CM | POA: Diagnosis not present

## 2017-12-15 DIAGNOSIS — J019 Acute sinusitis, unspecified: Secondary | ICD-10-CM | POA: Diagnosis not present

## 2017-12-15 LAB — CULTURE, GROUP A STREP (THRC)

## 2017-12-22 DIAGNOSIS — H65119 Acute and subacute allergic otitis media (mucoid) (sanguinous) (serous), unspecified ear: Secondary | ICD-10-CM | POA: Diagnosis not present

## 2017-12-22 DIAGNOSIS — H906 Mixed conductive and sensorineural hearing loss, bilateral: Secondary | ICD-10-CM | POA: Diagnosis not present

## 2017-12-24 ENCOUNTER — Ambulatory Visit: Payer: Medicare HMO | Admitting: Family Medicine

## 2017-12-24 ENCOUNTER — Encounter: Payer: Self-pay | Admitting: Family Medicine

## 2017-12-24 VITALS — BP 136/80 | HR 108 | Ht 65.0 in | Wt 210.0 lb

## 2017-12-24 DIAGNOSIS — E119 Type 2 diabetes mellitus without complications: Secondary | ICD-10-CM

## 2017-12-24 DIAGNOSIS — Z7189 Other specified counseling: Secondary | ICD-10-CM | POA: Diagnosis not present

## 2017-12-24 DIAGNOSIS — E785 Hyperlipidemia, unspecified: Secondary | ICD-10-CM | POA: Diagnosis not present

## 2017-12-24 DIAGNOSIS — I10 Essential (primary) hypertension: Secondary | ICD-10-CM | POA: Diagnosis not present

## 2017-12-24 DIAGNOSIS — E1149 Type 2 diabetes mellitus with other diabetic neurological complication: Secondary | ICD-10-CM | POA: Diagnosis not present

## 2017-12-24 DIAGNOSIS — Z0000 Encounter for general adult medical examination without abnormal findings: Secondary | ICD-10-CM

## 2017-12-24 DIAGNOSIS — E782 Mixed hyperlipidemia: Secondary | ICD-10-CM | POA: Diagnosis not present

## 2017-12-24 LAB — BAYER DCA HB A1C WAIVED: HB A1C (BAYER DCA - WAIVED): 6.4 % (ref <7.0)

## 2017-12-24 MED ORDER — METFORMIN HCL 500 MG PO TABS: 1000.0000 mg | ORAL_TABLET | Freq: Two times a day (BID) | ORAL | 4 refills | Status: DC

## 2017-12-24 MED ORDER — CANAGLIFLOZIN 300 MG PO TABS: 300.0000 mg | ORAL_TABLET | Freq: Every day | ORAL | 4 refills | Status: DC

## 2017-12-24 MED ORDER — SIMVASTATIN 40 MG PO TABS: 40.0000 mg | ORAL_TABLET | Freq: Every day | ORAL | 4 refills | Status: DC

## 2017-12-24 MED ORDER — PANTOPRAZOLE SODIUM 40 MG PO TBEC: 40.0000 mg | DELAYED_RELEASE_TABLET | Freq: Every day | ORAL | 4 refills | Status: DC

## 2017-12-24 NOTE — Assessment & Plan Note (Signed)
The current medical regimen is effective;  continue present plan and medications.  

## 2017-12-24 NOTE — Progress Notes (Signed)
BP 136/80 (BP Location: Left Arm)   Pulse (!) 108   Ht 5\' 5"  (1.651 m)   Wt 210 lb (95.3 kg)   SpO2 98%   BMI 34.95 kg/m    Subjective:    Patient ID: Alyssa Mayo, female    DOB: [birth year], 72 y.o.   MRN: [id number]  HPI: Alyssa Mayo is a 72 y.o. female  Chief Complaint  Patient presents with  . Annual Exam  Is on prednisone with resulting excursion of glucose as expected.  Patient on prednisone for serious sinus infection with ears being stopped up and multiple side effects from her infection.  Continues to take Augmentin and she does just started prednisone.  Prior to this patient's diabetes was doing well especially with the addition of glipizide.  Patient's hemoglobin A1c had been really good and today at 6.4. Takes gabapentin for diabetic peripheral neuropathy doing okay.  Relevant past medical, surgical, family and social history reviewed and updated as indicated. Interim medical history since our last visit reviewed. Allergies and medications reviewed and updated.  Review of Systems  Constitutional: Negative.   HENT: Negative.   Eyes: Negative.   Respiratory: Negative.   Cardiovascular: Negative.   Gastrointestinal: Negative.   Endocrine: Negative.   Genitourinary: Negative.   Musculoskeletal: Negative.   Skin: Negative.   Allergic/Immunologic: Negative.   Neurological: Negative.   Hematological: Negative.   Psychiatric/Behavioral: Negative.     Per HPI unless specifically indicated above     Objective:    BP 136/80 (BP Location: Left Arm)   Pulse (!) 108   Ht 5\' 5"  (1.651 m)   Wt 210 lb (95.3 kg)   SpO2 98%   BMI 34.95 kg/m   Wt Readings from Last 3 Encounters:  12/24/17 210 lb (95.3 kg)  12/12/17 212 lb (96.2 kg)  12/10/17 212 lb 9.6 oz (96.4 kg)    Physical Exam  Constitutional: She is oriented to person, place, and time. She appears well-developed and well-nourished.  HENT:  Head: Normocephalic and atraumatic.  Right Ear:  External ear normal.  Left Ear: External ear normal.  Nose: Nose normal.  Mouth/Throat: Oropharynx is clear and moist.  Eyes: Pupils are equal, round, and reactive to light. Conjunctivae and EOM are normal.  Neck: Normal range of motion. Neck supple. Carotid bruit is not present.  Cardiovascular: Normal rate, regular rhythm and normal heart sounds.  No murmur heard. Pulmonary/Chest: Effort normal and breath sounds normal. She exhibits no mass. Right breast exhibits no mass, no skin change and no tenderness. Left breast exhibits no mass, no skin change and no tenderness. Breasts are symmetrical.  Abdominal: Soft. Bowel sounds are normal. There is no hepatosplenomegaly.  Musculoskeletal: Normal range of motion.  Neurological: She is alert and oriented to person, place, and time.  Skin: No rash noted.  Psychiatric: She has a normal mood and affect. Her behavior is normal. Judgment and thought content normal.    Results for orders placed or performed during the hospital encounter of 12/12/17  Rapid Strep Screen ([facility] ONLY)  Result Value Ref Range   Streptococcus, Group A Screen (Direct) NEGATIVE NEGATIVE  Culture, group A strep  Result Value Ref Range   Specimen Description      THROAT Performed at [facility], [address], [place], [place] [address]    Special Requests      NONE Reflexed from [id number] Performed at [facility], [address], [place],  [place] [address]    Culture      NO GROUP A STREP (S.PYOGENES) ISOLATED Performed at [facility], [address], [place], [place] [address]    Report Status 12/15/2017 FINAL       Assessment & Plan:   Problem List Items Addressed This Visit      Cardiovascular and Mediastinum   Essential hypertension    The current medical regimen is effective;  continue present plan and medications.       Relevant Orders   Comprehensive metabolic panel   Lipid panel   Urinalysis, Routine w reflex  microscopic     Endocrine   DM (diabetes mellitus), type 2 with neurological complications (HCC) - Primary    The current medical regimen is effective;  continue present plan and medications. Patient followed by endocrinology discuss diabetes and prednisone patient will do better with diet nutrition intensively especially during the prednisone treatment. Sinusitis and ear is already improved.      Relevant Orders   Comprehensive metabolic panel   CBC with Differential/Platelet   TSH   Urinalysis, Routine w reflex microscopic     Other   Hyperlipidemia    The current medical regimen is effective;  continue present plan and medications.       Relevant Orders   Comprehensive metabolic panel   Lipid panel   TSH   Morbid obesity (HCC)    Secondary to diabetes hypertension hyperlipidemia discussed diet exercise nutrition      Relevant Orders   Comprehensive metabolic panel   CBC with Differential/Platelet   TSH   Urinalysis, Routine w reflex microscopic   Advanced care planning/counseling discussion    A voluntary discussion about advanced care planning including explanation and discussion of advanced directives was extentively discussed with the patient.  Explained about the healthcare proxy and living will was reviewed and packet with forms with expiration of how to fill them out was given.  Time spent: Encounter 16+ min individuals present: Patient          Follow up plan: Return in about 6 months (around 06/25/2018) for BMP,  Lipids, ALT, AST.

## 2017-12-24 NOTE — Assessment & Plan Note (Signed)
Secondary to diabetes hypertension hyperlipidemia discussed diet exercise nutrition

## 2017-12-24 NOTE — Assessment & Plan Note (Addendum)
The current medical regimen is effective;  continue present plan and medications. Patient followed by endocrinology discuss diabetes and prednisone patient will do better with diet nutrition intensively especially during the prednisone treatment. Sinusitis and ear is already improved.

## 2017-12-24 NOTE — Assessment & Plan Note (Signed)
A voluntary discussion about advanced care planning including explanation and discussion of advanced directives was extentively discussed with the patient.  Explained about the healthcare proxy and living will was reviewed and packet with forms with expiration of how to fill them out was given.  Time spent: Encounter 16+ min individuals present: Patient 

## 2018-01-12 DIAGNOSIS — H6980 Other specified disorders of Eustachian tube, unspecified ear: Secondary | ICD-10-CM | POA: Diagnosis not present

## 2018-01-12 DIAGNOSIS — H903 Sensorineural hearing loss, bilateral: Secondary | ICD-10-CM | POA: Diagnosis not present

## 2018-01-13 DIAGNOSIS — E1165 Type 2 diabetes mellitus with hyperglycemia: Secondary | ICD-10-CM | POA: Diagnosis not present

## 2018-01-13 DIAGNOSIS — E118 Type 2 diabetes mellitus with unspecified complications: Secondary | ICD-10-CM | POA: Diagnosis not present

## 2018-01-18 DIAGNOSIS — E1140 Type 2 diabetes mellitus with diabetic neuropathy, unspecified: Secondary | ICD-10-CM | POA: Diagnosis not present

## 2018-01-18 DIAGNOSIS — I10 Essential (primary) hypertension: Secondary | ICD-10-CM | POA: Diagnosis not present

## 2018-01-18 DIAGNOSIS — E669 Obesity, unspecified: Secondary | ICD-10-CM | POA: Diagnosis not present

## 2018-01-18 DIAGNOSIS — E1165 Type 2 diabetes mellitus with hyperglycemia: Secondary | ICD-10-CM | POA: Diagnosis not present

## 2018-01-18 DIAGNOSIS — E782 Mixed hyperlipidemia: Secondary | ICD-10-CM | POA: Diagnosis not present

## 2018-02-04 DIAGNOSIS — R69 Illness, unspecified: Secondary | ICD-10-CM | POA: Diagnosis not present

## 2018-02-15 DIAGNOSIS — H353211 Exudative age-related macular degeneration, right eye, with active choroidal neovascularization: Secondary | ICD-10-CM | POA: Diagnosis not present

## 2018-02-15 LAB — HM DIABETES EYE EXAM

## 2018-02-22 DIAGNOSIS — H353132 Nonexudative age-related macular degeneration, bilateral, intermediate dry stage: Secondary | ICD-10-CM | POA: Diagnosis not present

## 2018-03-26 ENCOUNTER — Telehealth: Payer: Self-pay

## 2018-03-26 NOTE — Telephone Encounter (Signed)
Please review order. Unsure if it needs to bilateral or left only? Also please add in DX code.

## 2018-03-26 NOTE — Telephone Encounter (Signed)
Copied from CRM #[id number]. Topic: Referral - Request >> Mar 25, 2018  9:16 AM Sebastian, Jennifer S wrote: Reason for CRM: Called to schedule yearly mammogram, since patient is having Left breast pain, she will need diagnostic mammogram with  Imaging. Can order be placed? >> Mar 25, 2018 10:28 AM Loralie Malta A, CMA wrote: Mammogram order needs changed from screening to diagnostic due to pt complaints of R. Breast pain.  >> Mar 25, 2018 11:38 AM Crissman, Mark A, MD wrote: ok 

## 2018-03-30 DIAGNOSIS — H353132 Nonexudative age-related macular degeneration, bilateral, intermediate dry stage: Secondary | ICD-10-CM | POA: Diagnosis not present

## 2018-03-30 DIAGNOSIS — H353211 Exudative age-related macular degeneration, right eye, with active choroidal neovascularization: Secondary | ICD-10-CM | POA: Diagnosis not present

## 2018-04-09 ENCOUNTER — Other Ambulatory Visit: Payer: Self-pay | Admitting: Family Medicine

## 2018-04-09 DIAGNOSIS — R69 Illness, unspecified: Secondary | ICD-10-CM | POA: Diagnosis not present

## 2018-04-09 NOTE — Telephone Encounter (Signed)
One Touch Test Strips refill Last refill  Historical Provider PCP Dr Dossie Arbour [facility] 12/24/17 Pharmacy [facility] [place]

## 2018-04-21 DIAGNOSIS — G4733 Obstructive sleep apnea (adult) (pediatric): Secondary | ICD-10-CM | POA: Diagnosis not present

## 2018-04-21 DIAGNOSIS — J029 Acute pharyngitis, unspecified: Secondary | ICD-10-CM | POA: Diagnosis not present

## 2018-04-26 DIAGNOSIS — H353211 Exudative age-related macular degeneration, right eye, with active choroidal neovascularization: Secondary | ICD-10-CM | POA: Diagnosis not present

## 2018-04-28 DIAGNOSIS — I10 Essential (primary) hypertension: Secondary | ICD-10-CM | POA: Diagnosis not present

## 2018-04-28 DIAGNOSIS — E1165 Type 2 diabetes mellitus with hyperglycemia: Secondary | ICD-10-CM | POA: Diagnosis not present

## 2018-04-28 DIAGNOSIS — E1140 Type 2 diabetes mellitus with diabetic neuropathy, unspecified: Secondary | ICD-10-CM | POA: Diagnosis not present

## 2018-04-28 DIAGNOSIS — E782 Mixed hyperlipidemia: Secondary | ICD-10-CM | POA: Diagnosis not present

## 2018-04-28 DIAGNOSIS — E669 Obesity, unspecified: Secondary | ICD-10-CM | POA: Diagnosis not present

## 2018-05-03 ENCOUNTER — Other Ambulatory Visit: Payer: Self-pay | Admitting: Family Medicine

## 2018-05-03 MED ORDER — DAPAGLIFLOZIN PROPANEDIOL 10 MG PO TABS: 10.0000 mg | ORAL_TABLET | Freq: Every day | ORAL | 5 refills | Status: DC

## 2018-05-14 ENCOUNTER — Telehealth: Payer: Self-pay

## 2018-05-14 DIAGNOSIS — N644 Mastodynia: Secondary | ICD-10-CM

## 2018-05-14 NOTE — Telephone Encounter (Signed)
Call pt Which side is the pain on then we can order

## 2018-05-14 NOTE — Telephone Encounter (Signed)
Copied from CRM [id number]. Topic: Referral - Request >> Mar 25, 2018  9:16 AM Oneal Grout wrote: Reason for CRM: Called to schedule yearly mammogram, since patient is having Left breast pain, she will need diagnostic mammogram with [facility]. Can order be placed? >> Mar 25, 2018 10:28 AM Richarda Overlie, CMA wrote: Mammogram order needs changed from screening to diagnostic due to pt complaints of R. Breast pain.  >> Mar 25, 2018 11:38 AM Steele Sizer, MD wrote: ok

## 2018-05-17 NOTE — Telephone Encounter (Signed)
Discussed with patient having left breast intermittent pain Go ahead and order the mammogram

## 2018-05-17 NOTE — Telephone Encounter (Signed)
Call pt Which side?

## 2018-05-17 NOTE — Telephone Encounter (Signed)
Patient was transferred to provider for telephone conversation.   

## 2018-05-18 NOTE — Telephone Encounter (Signed)
Please place the diagnostic mammogram order, we do not order the diagnostic ones.

## 2018-05-18 NOTE — Telephone Encounter (Signed)
done

## 2018-05-19 ENCOUNTER — Other Ambulatory Visit: Payer: Self-pay | Admitting: Family Medicine

## 2018-06-07 DIAGNOSIS — R69 Illness, unspecified: Secondary | ICD-10-CM | POA: Diagnosis not present

## 2018-06-07 DIAGNOSIS — H353211 Exudative age-related macular degeneration, right eye, with active choroidal neovascularization: Secondary | ICD-10-CM | POA: Diagnosis not present

## 2018-06-18 ENCOUNTER — Ambulatory Visit (INDEPENDENT_AMBULATORY_CARE_PROVIDER_SITE_OTHER): Payer: Medicare HMO | Admitting: Family Medicine

## 2018-06-18 ENCOUNTER — Other Ambulatory Visit: Payer: Self-pay

## 2018-06-18 ENCOUNTER — Encounter: Payer: Self-pay | Admitting: Family Medicine

## 2018-06-18 VITALS — BP 127/78 | HR 87 | Temp 98.8°F | Ht 65.0 in

## 2018-06-18 DIAGNOSIS — R109 Unspecified abdominal pain: Secondary | ICD-10-CM | POA: Diagnosis not present

## 2018-06-18 DIAGNOSIS — M545 Low back pain: Secondary | ICD-10-CM | POA: Diagnosis not present

## 2018-06-18 DIAGNOSIS — G8929 Other chronic pain: Secondary | ICD-10-CM | POA: Diagnosis not present

## 2018-06-18 LAB — CBC WITH DIFFERENTIAL/PLATELET
HEMOGLOBIN: 13.8 g/dL (ref 11.1–15.9)
Hematocrit: 40.6 % (ref 34.0–46.6)
Lymphocytes Absolute: 2.2 10*3/uL (ref 0.7–3.1)
Lymphs: 19 %
MCH: 29.6 pg (ref 26.6–33.0)
MCHC: 34 g/dL (ref 31.5–35.7)
MCV: 87 fL (ref 79–97)
MID (ABSOLUTE): 0.6 10*3/uL (ref 0.1–1.6)
MID: 5 %
NEUTROS ABS: 8.5 10*3/uL — AB (ref 1.4–7.0)
Neutrophils: 76 %
PLATELETS: 272 10*3/uL (ref 150–450)
RBC: 4.67 x10E6/uL (ref 3.77–5.28)
RDW: 15.5 % — ABNORMAL HIGH (ref 12.3–15.4)
WBC: 11.3 10*3/uL — AB (ref 3.4–10.8)

## 2018-06-18 LAB — UA/M W/RFLX CULTURE, ROUTINE
Bilirubin, UA: NEGATIVE
KETONES UA: NEGATIVE
Nitrite, UA: NEGATIVE
Protein, UA: NEGATIVE
RBC, UA: NEGATIVE
SPEC GRAV UA: 1.015 (ref 1.005–1.030)
UUROB: 0.2 mg/dL (ref 0.2–1.0)
pH, UA: 5 (ref 5.0–7.5)

## 2018-06-18 LAB — MICROSCOPIC EXAMINATION

## 2018-06-18 MED ORDER — NAPROXEN 500 MG PO TABS: 500.0000 mg | ORAL_TABLET | Freq: Two times a day (BID) | ORAL | 0 refills | Status: DC

## 2018-06-18 MED ORDER — CIPROFLOXACIN HCL 500 MG PO TABS: 500.0000 mg | ORAL_TABLET | Freq: Two times a day (BID) | ORAL | 0 refills | Status: DC

## 2018-06-18 MED ORDER — KETOROLAC TROMETHAMINE 60 MG/2ML IM SOLN
60.0000 mg | Freq: Once | INTRAMUSCULAR | Status: AC
  Administered 2018-06-18: 60 mg via INTRAMUSCULAR

## 2018-06-18 MED ORDER — TIZANIDINE HCL 4 MG PO TABS: 4.0000 mg | ORAL_TABLET | Freq: Every day | ORAL | 0 refills | Status: DC

## 2018-06-18 NOTE — Progress Notes (Signed)
BP 127/78   Pulse 87   Temp 98.8 F (37.1 C) (Oral)   Ht _0  (1.651 m)   SpO2 95%   BMI 34.95 kg/m    Subjective:    Patient ID: Alyssa Mayo, female    DOB: [birth year], 72 y.o.   MRN: [id number]  HPI: Alyssa Mayo is a 72 y.o. female  Chief Complaint  Patient presents with  . Back Pain    lower right side/ pt states pain started about a couple of weeks but got worse this morning/ states pain gets really bad when she try to stand up after using the restroom   BACK PAIN Duration: couple of weeks has been worse. Has had the pain in her side for years- has had several reasons for it including constipation, gall bladder issues, nothing- now pain is pretty severe after having a bowel movement. Has been able to be moving around with any issues. Hurts in the R lumbar- not flank, no radiation Mechanism of injury: unknown Location: Right and low back Onset: sudden Severity: 8/10 sitting, 10/10 standing  Quality: stabbing pain Frequency: constant- worse recently with standing and bowel movements Radiation: none Aggravating factors: bowel movements and standing Alleviating factors: nothing Status: worse Treatments attempted: rest and aleve  Relief with NSAIDs?: mild Nighttime pain:  no Paresthesias / decreased sensation:  no Bowel / bladder incontinence:  no Fevers:  no Dysuria / urinary frequency:  no  Has never had an MRI of her back, last did PT was 2 years ago  Relevant past medical, surgical, family and social history reviewed and updated as indicated. Interim medical history since our last visit reviewed. Allergies and medications reviewed and updated.  Review of Systems  Constitutional: Negative.   Respiratory: Negative.   Cardiovascular: Negative.   Gastrointestinal: Negative.   Musculoskeletal: Positive for back pain and myalgias. Negative for arthralgias, gait problem, joint swelling, neck pain and neck stiffness.  Skin: Negative.   Neurological:  Negative.   Psychiatric/Behavioral: Negative.     Per HPI unless specifically indicated above     Objective:    BP 127/78   Pulse 87   Temp 98.8 F (37.1 C) (Oral)   Ht _1  (1.651 m)   SpO2 95%   BMI 34.95 kg/m   Wt Readings from Last 3 Encounters:  12/24/17 210 lb (95.3 kg)  12/12/17 212 lb (96.2 kg)  12/10/17 212 lb 9.6 oz (96.4 kg)    Physical Exam  Constitutional: She is oriented to person, place, and time. She appears well-developed and well-nourished. No distress.  HENT:  Head: Normocephalic and atraumatic.  Right Ear: Hearing normal.  Left Ear: Hearing normal.  Nose: Nose normal.  Eyes: Conjunctivae and lids are normal. Right eye exhibits no discharge. Left eye exhibits no discharge. No scleral icterus.  Cardiovascular: Normal rate, regular rhythm, normal heart sounds and intact distal pulses. Exam reveals no gallop and no friction rub.  No murmur heard. Pulmonary/Chest: Effort normal and breath sounds normal. No stridor. No respiratory distress. She has no wheezes. She has no rales. She exhibits no tenderness.  Abdominal: Soft. Bowel sounds are normal. She exhibits no distension and no mass. There is no tenderness. There is no rebound and no guarding. No hernia.  Musculoskeletal: Normal range of motion.  Neurological: She is alert and oriented to person, place, and time.  Skin: Skin is warm, dry and intact. Capillary refill takes less than 2 seconds. No rash noted. She is not diaphoretic.  No erythema. No pallor.  Psychiatric: She has a normal mood and affect. Her speech is normal and behavior is normal. Judgment and thought content normal. Cognition and memory are normal.  Nursing note and vitals reviewed. Back Exam:    Inspection:  Normal spinal curvature.  No deformity, ecchymosis, erythema, or lesions     Palpation:     Midline spinal tenderness: no      Paralumbar tenderness: yes Right     Parathoracic tenderness: no      Buttocks tenderness: yesRight       Range of Motion:      Flexion: Fingers to Knees     Extension:Decreased     Lateral bending:Decreased    Rotation:Decreased    Neuro Exam:Lower extremity DTRs normal & symmetric.  Strength and sensation intact.   Results for orders placed or performed in visit on 02/17/18  HM DIABETES EYE EXAM  Result Value Ref Range   HM Diabetic Eye Exam No Retinopathy No Retinopathy      Assessment & Plan:   Problem List Items Addressed This Visit    None    Visit Diagnoses    Chronic right-sided low back pain without sciatica    -  Primary   In acute exacerbation. Will treat with toradol shot. Start naproxen and tizanidine at night. Will check x-ray with plan for MRI if abnormal and start PT.    Relevant Medications   naproxen (NAPROSYN) 500 MG tablet   tiZANidine (ZANAFLEX) 4 MG tablet   ketorolac (TORADOL) injection 60 mg (Completed)   Other Relevant Orders   DG Lumbar Spine Complete   Ambulatory referral to Physical Therapy   Flank pain       WBC 11.3 with 1+ leuks, will treat with cipro. Follow up with PCP as scheduled.    Relevant Orders   CBC With Differential/Platelet   UA/M w/rflx Culture, Routine   Urine Culture       Follow up plan: Return As scheduled (06/29/18 with [facility]).

## 2018-06-20 LAB — URINE CULTURE: ORGANISM ID, BACTERIA: NO GROWTH

## 2018-06-29 ENCOUNTER — Encounter: Payer: Self-pay | Admitting: Family Medicine

## 2018-06-29 ENCOUNTER — Ambulatory Visit (INDEPENDENT_AMBULATORY_CARE_PROVIDER_SITE_OTHER): Payer: Medicare HMO | Admitting: Family Medicine

## 2018-06-29 VITALS — BP 132/62 | HR 85 | Temp 98.0°F | Wt 213.2 lb

## 2018-06-29 DIAGNOSIS — E1149 Type 2 diabetes mellitus with other diabetic neurological complication: Secondary | ICD-10-CM | POA: Diagnosis not present

## 2018-06-29 DIAGNOSIS — I10 Essential (primary) hypertension: Secondary | ICD-10-CM | POA: Diagnosis not present

## 2018-06-29 DIAGNOSIS — E785 Hyperlipidemia, unspecified: Secondary | ICD-10-CM | POA: Diagnosis not present

## 2018-06-29 LAB — BAYER DCA HB A1C WAIVED: HB A1C: 6.6 % (ref <7.0)

## 2018-06-29 LAB — LP+ALT+AST PICCOLO, WAIVED
ALT (SGPT) PICCOLO, WAIVED: 18 U/L (ref 10–47)
AST (SGOT) Piccolo, Waived: 15 U/L (ref 11–38)
CHOL/HDL RATIO PICCOLO,WAIVE: 2.8 mg/dL
Cholesterol Piccolo, Waived: 178 mg/dL (ref <200)
HDL Chol Piccolo, Waived: 64 mg/dL (ref >59)
LDL Chol Calc Piccolo Waived: 86 mg/dL (ref <100)
Triglycerides Piccolo,Waived: 141 mg/dL (ref <150)
VLDL CHOL CALC PICCOLO,WAIVE: 28 mg/dL (ref <30)

## 2018-06-29 NOTE — Assessment & Plan Note (Signed)
The current medical regimen is effective;  continue present plan and medications.  

## 2018-06-29 NOTE — Progress Notes (Signed)
BP 132/62 (BP Location: Left Arm)   Pulse 85   Temp 98 F (36.7 C) (Oral)   Wt 213 lb 3.2 oz (96.7 kg)   SpO2 96%   BMI 35.48 kg/m    Subjective:    Patient ID: Alyssa Mayo, female    DOB: 08/27/1945, 72 y.o.   MRN: [id number]  HPI: Alyssa Mayo is a 72 y.o. female  Chief Complaint  Patient presents with  . Hyperlipidemia  . Hypertension   DM Patient follow-up hypertension hypercholesterol diabetes is been doing well.  Diabetes no low blood sugar spells or problems. Blood pressure good control cholesterol also doing well with no complaints.   Relevant past medical, surgical, family and social history reviewed and updated as indicated. Interim medical history since our last visit reviewed. Allergies and medications reviewed and updated.  Review of Systems  Constitutional: Negative.   Respiratory: Negative.   Cardiovascular: Negative.     Per HPI unless specifically indicated above     Objective:    BP 132/62 (BP Location: Left Arm)   Pulse 85   Temp 98 F (36.7 C) (Oral)   Wt 213 lb 3.2 oz (96.7 kg)   SpO2 96%   BMI 35.48 kg/m   Wt Readings from Last 3 Encounters:  06/29/18 213 lb 3.2 oz (96.7 kg)  12/24/17 210 lb (95.3 kg)  12/12/17 212 lb (96.2 kg)    Physical Exam  Constitutional: She is oriented to person, place, and time. She appears well-developed and well-nourished.  HENT:  Head: Normocephalic and atraumatic.  Eyes: Conjunctivae and EOM are normal.  Neck: Normal range of motion.  Cardiovascular: Normal rate, regular rhythm and normal heart sounds.  Pulmonary/Chest: Effort normal and breath sounds normal.  Musculoskeletal: Normal range of motion.  Neurological: She is alert and oriented to person, place, and time.  Skin: No erythema.  Psychiatric: She has a normal mood and affect. Her behavior is normal. Judgment and thought content normal.    Results for orders placed or performed in visit on 06/29/18  LP+ALT+AST Piccolo, [employer]    Result Value Ref Range   ALT (SGPT) Piccolo, Waived 18 10 - 47 U/L   AST (SGOT) Piccolo, Waived 15 11 - 38 U/L   Cholesterol Piccolo, Waived 178 <200 mg/dL   HDL Chol Piccolo, Waived 64 >59 mg/dL   Triglycerides Piccolo,Waived 141 <150 mg/dL   Chol/HDL Ratio Piccolo,Waive 2.8 mg/dL   LDL Chol Calc Piccolo Waived 86 <100 mg/dL   VLDL Chol Calc Piccolo,Waive 28 <30 mg/dL      Assessment & Plan:   Problem List Items Addressed This Visit      Cardiovascular and Mediastinum   Essential hypertension - Primary    The current medical regimen is effective;  continue present plan and medications.       Relevant Orders   Basic metabolic panel     Endocrine   DM (diabetes mellitus), type 2 with neurological complications (HCC)    The current medical regimen is effective;  continue present plan and medications.       Relevant Orders   Bayer DCA Hb A1c Waived     Other   Hyperlipidemia    The current medical regimen is effective;  continue present plan and medications.       Relevant Orders   LP+ALT+AST Piccolo, Waived (Completed)       Follow up plan: Return in about 6 months (around 12/29/2018) for Physical Exam, Hemoglobin A1c.

## 2018-06-30 ENCOUNTER — Encounter: Payer: Self-pay | Admitting: Family Medicine

## 2018-06-30 LAB — BASIC METABOLIC PANEL
BUN/Creatinine Ratio: 27 (ref 12–28)
BUN: 24 mg/dL (ref 8–27)
CALCIUM: 9.5 mg/dL (ref 8.7–10.3)
CO2: 20 mmol/L (ref 20–29)
CREATININE: 0.9 mg/dL (ref 0.57–1.00)
Chloride: 106 mmol/L (ref 96–106)
GFR calc Af Amer: 73 mL/min/{1.73_m2} (ref >59)
GFR calc non Af Amer: 64 mL/min/{1.73_m2} (ref >59)
Glucose: 105 mg/dL — ABNORMAL HIGH (ref 65–99)
Potassium: 4.6 mmol/L (ref 3.5–5.2)
Sodium: 144 mmol/L (ref 134–144)

## 2018-07-19 DIAGNOSIS — H353211 Exudative age-related macular degeneration, right eye, with active choroidal neovascularization: Secondary | ICD-10-CM | POA: Diagnosis not present

## 2018-07-26 ENCOUNTER — Other Ambulatory Visit: Payer: Self-pay

## 2018-07-26 ENCOUNTER — Ambulatory Visit: Payer: Medicare HMO | Attending: Family Medicine | Admitting: Physical Therapy

## 2018-07-26 DIAGNOSIS — M25551 Pain in right hip: Secondary | ICD-10-CM | POA: Diagnosis not present

## 2018-07-26 DIAGNOSIS — M545 Low back pain: Secondary | ICD-10-CM | POA: Insufficient documentation

## 2018-07-26 DIAGNOSIS — G8929 Other chronic pain: Secondary | ICD-10-CM | POA: Diagnosis not present

## 2018-07-26 DIAGNOSIS — M6281 Muscle weakness (generalized): Secondary | ICD-10-CM | POA: Diagnosis not present

## 2018-07-26 NOTE — Therapy (Signed)
[facility] PH[facility] AND [facility] MEDICINE [address] [place], [address] Phone: [phone]   Fax:  [phone]  Physical Therapy Evaluation  Patient Details  Name: Alyssa Mayo MRN: [id number] Date of Birth: 1946-04-01 Referring Provider (PT): Dorcas Carrow, [place]    Encounter Date: 07/26/2018  PT End of Session - 07/27/18 1612    Visit Number  1    Number of Visits  12    Date for PT Re-Evaluation  09/07/18    Authorization Type  [employer] HMO/PPO    Authorization Time Period  Current Cert period: 07/26/2018 - 09/07/2018 (last PN: IE 07/26/2018)    Authorization - Visit Number  1    Authorization - Number of Visits  10    PT Start Time  [phone]    PT Stop Time  1030    PT Time Calculation (min)  48 min    Activity Tolerance  Patient tolerated treatment well    Behavior During Therapy  [facility] for tasks assessed/performed       Past Medical History:  Diagnosis Date  . Arthritis   . Diabetes mellitus without complication (HCC)   . Elevated liver enzymes   . Fatty liver   . Hyperlipidemia   . Lifelong obesity   . Sleep apnea     Past Surgical History:  Procedure Laterality Date  . ABDOMINAL HYSTERECTOMY    . APPENDECTOMY    . CERVICAL LAMINECTOMY  1987  . CESAREAN SECTION    . COLONOSCOPY  2014   diverticulosis, [facility] Dr. [name]   . SPINE SURGERY    . TUBAL LIGATION      There were no vitals filed for this visit.  SUBJECTIVE: HISTORY:  Patient is a 73 y.o. female who presents to outpatient physical therapy with a referral for medical diagnosis of chronic right-sided low back pain without sciatica. This patient's chief complaints consist of pain in her right flank and lumbar region as well as hip/glute pain on the right, and reduced activity tolerance leading to the following functional deficits: difficulty with her usual ADLs, IADLs, household and community mobility, and community participation.  Relevant past medical  history and comorbidities include obesity, diverticulosis, diabetes mellitus, peripheral neuropathy, sleep apnea, fatty liver, diverticulosis, history of cervical laminectomy, appendectomy, abdominal hysterectomy.  Imaging: radiographs of lumbar and hips performed 2 years ago (see chart, noted osteopenia in the hips)  Patient reports she was originally referred to PT due to increased pain in her side that was pretty severe. She has had multiple imaging (radiographs, and ultrasound) with a different conclusion each time (gallbladder wall thickened - but then cleared, wall of bladder thickened - was deemed consistent with BURCH procedure in 2005 to tack up bladder after hysterectomy, considerable stool/constipation - last colonoscopy was 5 years ago (some diverticulosis) - was told it could be radicular pain. Right posterior flank is where this pain is. She states this pain has decreased overall but now her right hip is hurting. She has a history of right hip pain intermittently for years, and it has always been found to be originating in her back. She has had prior PT for the right hip pain, that helped. She reports her right arm is numb all the time and is "so numb it hurts." In 1986-87 she had a cervical laminectomy and surgery for pinched nerve and ruptured disc. Then her left hand was numb with lingering numbness in L the index and thumb post op  that has not been limiting for her. Her right arm started going numb in the last 2 months. She thinks it came on gradually. It now varies in intensity but is constant. She informed her physician about it who she said advised her to lose weight in order to address her arm problems. She currently does not have a PT referral for cervical or UE symptoms.  FUNCTION Patient-reported Outcome Measure: FOTO = 63 PLOF: independent Current Level of Function: is able to continue her usual activities but slows her down.   PAIN Location: right glute, did have left flank pain  but is now gone (does not correlate to right glute pain) Nature: dull ache, sometimes more sharp. Never distal to this region.  Pain Scale:  Patient reports current pain as 3/10, at best 0/10 (last time was within the last couple of weeks), at worst 10/10 (last time was not recently). Paresthesia: right UE, left thumb and index finger, peripheral neuropathy in bilateral feet. Aggravating factors: laying on that side feels like she is laying on something Easing factors: laying down on left side with pillow between legs (helps if consistently do this at night).  24-hour pattern: denies    [facility] PT Assessment - 07/27/18 0001      Assessment   Medical Diagnosis  Chronic right-sided low back pain without sciatica    Referring Provider (PT)  Dorcas Carrow, DO     Next MD Visit  march or april 2020    Prior Therapy  She has had prior PT for the right hip pain, that helped.       Precautions   Precautions  None      Restrictions   Weight Bearing Restrictions  No      Balance Screen   Has the patient fallen in the past 6 months  No    Has the patient had a decrease in activity level because of a fear of falling?   No    Is the patient reluctant to leave their home because of a fear of falling?   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    NEUROLOGICAL: Dermatomes: BLE equal to light touch Myotomes: BLE WFL  SPINE MOTION Lumbar AROM:  *Indicates pain - Flexion: = 100. - Extension: = 50% stretch in shoulders. - Rotation: R = WNL,  L = motion WFL, increase ERP R flank. - Side Flexion: R = R flank pain, 50%, L = ERP right glute  PERIPHERAL JOINT MOTION (AROM/PROM in degrees):  *Indicates pain Hip  - Bilateral hip grossly WFL. Knee - Bilateral knees grossly WFL.  STRENGTH:  *Indicates pain Hip  - Flexion: R = 3/5, L = 4/5. - Extension: R = 4/5, L = 4/5. - Abduction: R =  4/5, L = 4/5. Knee - Ext: R = 5/5, L = 5/5. - Flex: R = 4/5, L = 4/5.  REPEATED MOTIONS TESTING: - Repeated lumbar extension in standing x 10. during = no effect ; after = abolished in hip.  SPECIAL TESTS: Straight leg raise (SLR): B = negative FABER: B = negative  FUNCTIONAL MOBILITY: - Bed mobility: supine <> sit and rolling, Independent. - Transfers: sit <> stand I - Gait: ambulates I for household and community distances.  - Stairs: not tested today  Objective measurements completed on examination: See above findings.    TREATMENT:  Denies latex allergies, was on prednisone for a month (longest she has been on it), denies diagnosed osteoporosis or osteopenia, history of cervical spine lamenectomy - denies any other spinal surgery. Denies history of cancer.   Therapeutic exercise: to centralize symptoms and improve ROM and strength required for successful completion of functional activities.  - Repeated lumbar extension in standing x 10. during = no effect ; after = abolished in hip. - trial of posture correction seated with lumbar roll.  - Education on HEP including handout  - Education on diagnosis, prognosis, POC, anatomy and physiology of current condition.   HOME EXERCISE PROGRAM Access Code: [email or web address]  URL: [email or web address]  Date: 07/26/2018  Prepared by: Norton BlizzardSara Zakaria Fromer   Exercises  Standing Lumbar  Extension with Counter - 10-15 reps - 1 sets - 1 second hold - 4x daily  Seated Correct Posture    Patient response to treatment:  Pt tolerated treatment well. Pt was able to complete all exercises with minimal to no lasting increase in pain or discomfort. Pt required cuing for proper technique and to facilitate improved neuromuscular control, strength, range of motion, and functional ability.   PT Short Term Goals - 07/27/18 1628      PT SHORT TERM GOAL #1   Title  Be independent with initial home exercise program for self-management of symptoms.    Baseline  initial HEP provided at IE (07/26/2018)    Time  2    Period  Weeks    Status  New    Target Date  08/10/18        PT Long Term Goals - 07/27/18 1630      PT LONG TERM GOAL #1   Title  Be independent with a long-term home exercise program for self-management of symptoms    Baseline  initial HEP provided 07/26/2018    Time  6    Period  Weeks    Status  New    Target Date  09/07/18      PT LONG TERM GOAL #2   Title  Demonstrate improved FOTO score by 10 units to demonstrate improvement in overall condition and self-reported functional ability.     Baseline  FOTO = 63 (07/26/2018);    Time  6    Period  Weeks    Status  New    Target Date  09/07/18      PT LONG TERM GOAL #3   Title  Patient will demonstrate BLE strength 5/5 to demonstrate functional strength for unlimited mobility, improved activity tolerance, IADLs, and social participation.     Baseline  see objective exam    Time  6    Period  Weeks    Status  New    Target Date  09/07/18      PT LONG TERM GOAL #4   Title  Demonstrate full lumbar AROM with no increase in  pain in all planes except intermittent end range discomfort to allow patient to complete valued activities with less difficulty.     Baseline  see objective exam    Time  6    Period  Weeks    Status  New    Target Date  09/07/18      PT LONG TERM GOAL #5   Title  Complete community, work  and/or recreational activities without limitation due to current condition.     Baseline  difficulty with her usual ADLs, IADLs, household and community mobility, and community participation.     Time  6    Period  Weeks    Status  New    Target Date  09/07/18             Plan - 07/27/18 1601    Clinical Impression Statement  Patient is a 73 y.o. female referred to outpatient physical therapy with a medical diagnosis of chronic right-sided low back pain without sciatica who presents with signs and symptoms consistent with low back pain with referral to right LE. Patient demonstrates limitations in mobility, strength, and activity tolerance that leads to decreased ability to participate fully in previously enjoyed activities such as watching her grandson. She would benefit from skilled physical therapy to address her impairments and functional limitations to allow her to achieve PLOF or maximal function possible.     History and Personal Factors relevant to plan of care:   obesity, diverticulosis, diabetes mellitus, peripheral neuropathy, sleep apnea, fatty liver, diverticulosis, history of cervical laminectomy, appendectomy, abdominal hysterectomy.     Clinical Presentation  Evolving    Clinical Presentation due to:  symptom location keeps changing    Clinical Decision Making  Moderate    Rehab Potential  Good    Clinical Impairments Affecting Rehab Potential  (+) motivation, prior success with PT (-) possible visceral source of pain for flank symptoms, obesity, comorbidities    PT Frequency  2x / week    PT Duration  6 weeks    PT Treatment/Interventions  ADLs/Self Care Home Management;Aquatic Therapy;Biofeedback;Electrical Stimulation;Cryotherapy;Moist Heat;Gait training;Stair training;Functional mobility training;Therapeutic activities;Therapeutic exercise;Balance training;Neuromuscular re-education;Patient/family education;Manual techniques;Passive range of motion;Dry needling;Spinal  Manipulations;Joint Manipulations;Other (comment)   joint mobilizations grades I-V, pain neuroscience education   PT Next Visit Plan  assess response to HEP and modify as appropriate. Progress LE, trunk, and functional strengthening and mobility exercises as needed.     PT Home Exercise Plan  Medbridge Access Code: [email or web address]     Consulted and Agree with Plan of Care  Patient          Patient will benefit from skilled therapeutic intervention in order to improve the following deficits and impairments:  Abnormal gait, Decreased activity tolerance, Decreased endurance, Decreased strength, Decreased range of motion, Impaired perceived functional ability, Hypomobility, Pain, Obesity, Postural dysfunction, Impaired flexibility, Decreased balance, Decreased mobility  Visit Diagnosis: Chronic right-sided low back pain without sciatica  Pain in right hip  Muscle weakness (generalized)     Problem List Patient Active Problem List   Diagnosis Date Noted  . Advanced care planning/counseling discussion 12/17/2016  . Skin lesions, generalized 11/13/2016  . Chronic right hip pain 06/17/2016  . Vitamin D deficiency 09/26/2015  . Diastasis recti 08/08/2015  . Elevated serum GGT level 07/24/2015  . Essential hypertension 07/17/2015  . Fatty liver 07/17/2015  . Elevated alkaline phosphatase level 07/17/2015  . Elevated serum glutamic pyruvic transaminase (SGPT) level 07/17/2015  . Abnormal finding on EKG 07/11/2015  .  Morbid obesity (HCC) 07/11/2015  . Colon, diverticulosis 07/11/2015  . Abdominal wall hernia 07/11/2015  . DM (diabetes mellitus), type 2 with neurological complications (HCC)   . Hyperlipidemia     Cira Rue, PT, DPT 07/27/2018, 4:35 PM  [facility] PHYSICAL AND SPORTS MEDICINE [address] [place], [address] Phone: [phone]   Fax:  [phone]  Name: Alyssa Mayo MRN: [id number] Date of Birth: [birth year]

## 2018-07-27 ENCOUNTER — Encounter: Payer: Self-pay | Admitting: Physical Therapy

## 2018-07-27 NOTE — Addendum Note (Signed)
Addended by: Norton Blizzard R on: 07/27/2018 04:43 PM   Modules accepted: Orders

## 2018-07-28 ENCOUNTER — Encounter: Payer: Self-pay | Admitting: Physical Therapy

## 2018-07-28 ENCOUNTER — Ambulatory Visit: Payer: Medicare HMO | Admitting: Physical Therapy

## 2018-07-28 DIAGNOSIS — M545 Low back pain: Principal | ICD-10-CM

## 2018-07-28 DIAGNOSIS — M6281 Muscle weakness (generalized): Secondary | ICD-10-CM

## 2018-07-28 DIAGNOSIS — M25551 Pain in right hip: Secondary | ICD-10-CM | POA: Diagnosis not present

## 2018-07-28 DIAGNOSIS — G8929 Other chronic pain: Secondary | ICD-10-CM

## 2018-07-28 NOTE — Therapy (Signed)
[facility] PHYSICAL AND SPOR[facility] MEDICINE [address] , [place], [address] Phone: [phone]   Fax:  [phone]  Physical Therapy Treatment  Patient Details  Name: Alyssa CutterMary Louise Mayo MRN: [id number] Date of Birth: 09/13/1945 Referring Provider (PT): Dorcas CarrowJohnson, Megan P, [place]    Encounter Date: 07/28/2018  PT End of Session - 07/28/18 1045    Visit Number  2    Number of Visits  12    Date for PT Re-Evaluation  09/07/18    Authorization Type  [employer] HMO/PPO    Authorization Time Period  Current Cert period: 07/26/2018 - 09/07/2018 (last PN: IE 07/26/2018)    Authorization - Visit Number  2    Authorization - Number of Visits  10    PT Start Time  0950    PT Stop Time  1035    PT Time Calculation (min)  45 min    Equipment Utilized During Treatment  Gait belt    Activity Tolerance  Patient tolerated treatment well    Behavior During Therapy  [facility] for tasks assessed/performed       Past Medical History:  Diagnosis Date  . Arthritis   . Diabetes mellitus without complication (HCC)   . Elevated liver enzymes   . Fatty liver   . Hyperlipidemia   . Lifelong obesity   . Sleep apnea     Past Surgical History:  Procedure Laterality Date  . ABDOMINAL HYSTERECTOMY    . APPENDECTOMY    . CERVICAL LAMINECTOMY  1987  . CESAREAN SECTION    . COLONOSCOPY  2014   diverticulosis, [facility] Dr. Ricki RodriguezSkulski   . SPINE SURGERY    . TUBAL LIGATION      There were no vitals filed for this visit.  Subjective Assessment - 07/28/18 0955    Subjective  Patient reports she has 0/10 pain in her back today. She states she has been performing her HEP and she thinks it is helping her arm pain. Her right arm is slightly numb right now but not as much as it is sometimes. She feels the exercises may also be helping her low back and hip pain, definitely not making it worse. She did not sleep well last night due to having increased neuropathy pain in her feet.  Patient  reports her right UE paresthesia is most prominent in her first three digits on the palmar side.     Pertinent History  Patient is a 73 y.o. female who presents to outpatient physical therapy with a referral for medical diagnosis of chronic right-sided low back pain without sciatica. This patient's chief complaints consist of pain in her right flank and lumbar region as well as hip/glute pain on the right, and reduced activity tolerance leading to the following functional deficits: difficulty with her usual ADLs, IADLs, household and community mobility, and community participation.     Limitations  Other (comment);Sitting   laying on that side feels like she is laying on something   Currently in Pain?  Other (Comment)   no pain in back or right hip upon arrival. does have paresthesia in right arm   Pain Onset  More than a month ago        OBJECTIVE:  - BUE dermatomes WNL - MMT: RUE 4-/5 LUE 4+/5 grossly all motions.  - spurling's test negative for arm paresthesia bilaterally.  - cervical axial compression and distraction negative for arm paresthesia.  - Cervical spine AROM:  no significant change  in RUE paresthesia with AROM of cervical spine in any direction. Limited motion in all directions towards left > R.  - Repeated Motions: seated cervical retraction x 15, decreased hand paresthesia.    TREATMENT:  Denies latex allergies, was on prednisone for a month (longest she has been on it), denies diagnosed osteoporosis or osteopenia, history of cervical spine lamenectomy - denies any other spinal surgery. Denies history of cancer.   Therapeutic exercise: to centralize symptoms and improve ROM and strength required for successful completion of functional activities.  - Treadmill up to 1.5 mph with 0%  grade with SBA. For improved lower extremity mobility, muscular endurance, and weightbearing activity tolerance; and to induce the analgesic effect of aerobic exercise, stimulate improved joint  nutrition, and prepare body structures and systems for following interventions. X 5  Minutes during subjective exam. - Repeated lumbar extension in standing 2x 15. during = decreasing pain; after = abolished in hip. - seated cervical retraction x 15, decreased hand paresthesia.  - cervical spine screen to address R arm paresthesia to allow pt to complete exercises for lumbar spine more easily. (see above) - Standing rows with scapular retraction for improved postural and shoulder girdle strengthening and mobility. Required instruction for technique and cuing to retract, posteriorly tilt, and depress scapulae. x30 with red theraband (low resistance to allow more attention to form; attempted with blue but unable to maintain form).  - Side stepping with green theraband wrapped around legs and held at umbilicus to activate core and hip stabilizers and abductors in functional weightbearing position. Cuing to activate abdominals. Stepping a few steps out and in as allowed by band x10 feet each side. CGA and gait belt for safety.  - Standing bilateral shoulder extension with scapular retraction for improved postural and shoulder girdle strengthening and mobility. Required instruction for technique and cuing to retract, posteriorly tilt, and depress scapulae. x30 with red theraband anchored overhead. (low resistance to allow more attention to form).  - Standing pallof press (multifidus press) with single green theraband x20 each side. Cuing for trunk activation and form.  - Education on HEP including handout  - Education on diagnosis, prognosis, POC, anatomy and physiology of current condition.   HOME EXERCISE PROGRAM Access Code: [email or web address]  URL: [email or web address]  Date: 07/28/2018  Prepared by: Norton Blizzard   Exercises  Standing Lumbar Extension with Counter - 10-15 reps - 1 sets - 1 second hold - 6x daily  Seated Correct Posture  Seated Cervical Retraction - 10-15 reps - 1 second hold  - 4-6x daily     Patient response to treatment: Pt tolerated treatment well. Pt was able to complete all exercises with minimal to no lasting increase in pain or discomfort. She reported decreased paresthesia in right hand with repeated cervical spine retraction, so this was added to her HEP to allow her to improve her hand paresthesia and be able to participate more effectively in exercises for lumbar/hip pain. She also experienced abolishment of lumbar/hip symptoms with repeated lumbar extension. Trunk strengthening exercises were introduced with good tolerance today. Pt required cuing for proper technique and to facilitate improved neuromuscular control, strength, range of motion, and functional ability. She is making progress towards her goals at this point and reported no back or hip pain and reduced right arm paresthesia at end of session.   PT Education - 07/28/18 1044    Education Details  - Education on [employer] including handout. - Education on diagnosis, prognosis, POC, anatomy and physiology of  current condition. . Cuing for exercise form, technique, purpose.    Person(s) Educated  Patient    Methods  Explanation;Demonstration;Tactile cues;Verbal cues;Handout    Comprehension  Verbalized understanding;Returned demonstration       PT Short Term Goals - 07/27/18 1628      PT SHORT TERM GOAL #1   Title  Be independent with initial home exercise program for self-management of symptoms.    Baseline  initial HEP provided at IE (07/26/2018)    Time  2    Period  Weeks    Status  New    Target Date  08/10/18        PT Long Term Goals - 07/27/18 1630      PT LONG TERM GOAL #1   Title  Be independent with a long-term home exercise program for self-management of symptoms    Baseline  initial HEP provided 07/26/2018    Time  6    Period  Weeks    Status  New    Target Date  09/07/18      PT LONG TERM GOAL #2   Title  Demonstrate improved FOTO score by 10 units to demonstrate  improvement in overall condition and self-reported functional ability.     Baseline  FOTO = 63 (07/26/2018);    Time  6    Period  Weeks    Status  New    Target Date  09/07/18      PT LONG TERM GOAL #3   Title  Patient will demonstrate BLE strength 5/5 to demonstrate functional strength for unlimited mobility, improved activity tolerance, IADLs, and social participation.     Baseline  see objective exam    Time  6    Period  Weeks    Status  New    Target Date  09/07/18      PT LONG TERM GOAL #4   Title  Demonstrate full lumbar AROM with no increase in pain in all planes except intermittent end range discomfort to allow patient to complete valued activities with less difficulty.     Baseline  see objective exam    Time  6    Period  Weeks    Status  New    Target Date  09/07/18      PT LONG TERM GOAL #5   Title  Complete community, work and/or recreational activities without limitation due to current condition.     Baseline  difficulty with her usual ADLs, IADLs, household and community mobility, and community participation.     Time  6    Period  Weeks    Status  New    Target Date  09/07/18            Plan - 07/28/18 1045    Clinical Impression Statement  Pt tolerated treatment well. Pt was able to complete all exercises with minimal to no lasting increase in pain or discomfort. She reported decreased paresthesia in right hand with repeated cervical spine retraction, so this was added to her HEP to allow her to improve her hand paresthesia and be able to participate more effectively in exercises for lumbar/hip pain. She also experienced abolishment of lumbar/hip symptoms with repeated lumbar extension. Trunk strengthening exercises were introduced with good tolerance today. Pt required cuing for proper technique and to facilitate improved neuromuscular control, strength, range of motion, and functional ability. She is making progress towards her goals at this point and  reported no back or hip pain  and reduced right arm paresthesia at end of session.     Rehab Potential  Good    Clinical Impairments Affecting Rehab Potential  (+) motivation, prior success with PT (-) possible visceral source of pain for flank symptoms, obesity, comorbidities    PT Frequency  2x / week    PT Duration  6 weeks    PT Treatment/Interventions  ADLs/Self Care Home Management;Aquatic Therapy;Biofeedback;Electrical Stimulation;Cryotherapy;Moist Heat;Gait training;Stair training;Functional mobility training;Therapeutic activities;Therapeutic exercise;Balance training;Neuromuscular re-education;Patient/family education;Manual techniques;Passive range of motion;Dry needling;Spinal Manipulations;Joint Manipulations;Other (comment)   joint mobilizations grades I-V, pain neuroscience education   PT Next Visit Plan  assess response to HEP and modify as appropriate. Progress LE, trunk, and functional strengthening and mobility exercises as needed.     PT Home Exercise Plan  Medbridge Access Code: [email or web address]     Consulted and Agree with Plan of Care  Patient       Patient will benefit from skilled therapeutic intervention in order to improve the following deficits and impairments:  Abnormal gait, Decreased activity tolerance, Decreased endurance, Decreased strength, Decreased range of motion, Impaired perceived functional ability, Hypomobility, Pain, Obesity, Postural dysfunction, Impaired flexibility, Decreased balance, Decreased mobility  Visit Diagnosis: Chronic right-sided low back pain without sciatica  Pain in right hip  Muscle weakness (generalized)     Problem List Patient Active Problem List   Diagnosis Date Noted  . Advanced care planning/counseling discussion 12/17/2016  . Skin lesions, generalized 11/13/2016  . Chronic right hip pain 06/17/2016  . Vitamin D deficiency 09/26/2015  . Diastasis recti 08/08/2015  . Elevated serum GGT level 07/24/2015  . Essential hypertension  07/17/2015  . Fatty liver 07/17/2015  . Elevated alkaline phosphatase level 07/17/2015  . Elevated serum glutamic pyruvic transaminase (SGPT) level 07/17/2015  . Abnormal finding on EKG 07/11/2015  . Morbid obesity (HCC) 07/11/2015  . Colon, diverticulosis 07/11/2015  . Abdominal wall hernia 07/11/2015  . DM (diabetes mellitus), type 2 with neurological complications (HCC)   . Hyperlipidemia     Cira Rue, PT, DPT 07/28/2018, 10:49 AM  [facility] PHYSICAL AND SPORTS MEDICINE [address] [place], [address] Phone: [phone]   Fax:  [phone]  Name: Alyssa Mayo MRN: [id number] Date of Birth: 05-22-1946

## 2018-08-02 ENCOUNTER — Other Ambulatory Visit: Payer: Self-pay

## 2018-08-02 ENCOUNTER — Encounter: Payer: Self-pay | Admitting: Physical Therapy

## 2018-08-02 ENCOUNTER — Ambulatory Visit: Payer: Medicare HMO | Admitting: Physical Therapy

## 2018-08-02 DIAGNOSIS — M6281 Muscle weakness (generalized): Secondary | ICD-10-CM | POA: Diagnosis not present

## 2018-08-02 DIAGNOSIS — M545 Low back pain: Principal | ICD-10-CM

## 2018-08-02 DIAGNOSIS — G8929 Other chronic pain: Secondary | ICD-10-CM

## 2018-08-02 DIAGNOSIS — M25551 Pain in right hip: Secondary | ICD-10-CM | POA: Diagnosis not present

## 2018-08-02 NOTE — Therapy (Signed)
[facility] [address] [place], [place], [address] Phone: [phone]   Fax:  [phone]  Physical Therapy Treatment  Patient Details  Name: Alyssa CutterMary Louise Mayo MRN: [id number] Date of Birth: 09/02/1945 Referring Provider (PT): Dorcas CarrowJohnson, Megan P, [place]    Encounter Date: 08/02/2018  PT End of Session - 08/02/18 1029    Visit Number  3    Number of Visits  12    Date for PT Re-Evaluation  09/07/18    Authorization Type  [employer] HMO/PPO reporting period from 07/26/2018    Authorization Time Period  Current Cert period: 07/26/2018 - 09/07/2018 (last PN: [facility] 07/26/2018)    Authorization - Visit Number  3    Authorization - Number of Visits  10    PT Start Time  0950    PT Stop Time  1035    PT Time Calculation (min)  45 min    Equipment Utilized During Treatment  Gait belt    Activity Tolerance  Patient tolerated treatment well    Behavior During Therapy  [facility] for tasks assessed/performed       Past Medical History:  Diagnosis Date  . Arthritis   . Diabetes mellitus without complication (HCC)   . Elevated liver enzymes   . Fatty liver   . Hyperlipidemia   . Lifelong obesity   . Sleep apnea     Past Surgical History:  Procedure Laterality Date  . ABDOMINAL HYSTERECTOMY    . APPENDECTOMY    . CERVICAL LAMINECTOMY  1987  . CESAREAN SECTION    . COLONOSCOPY  2014   diverticulosis, [facility] Dr. Ricki RodriguezSkulski   . SPINE SURGERY    . TUBAL LIGATION      There were no vitals filed for this visit.  Subjective Assessment - 08/02/18 0955    Subjective  Patient reports no pain upon arrival and mild numbness in the right fingers that she reports she is quite a bit better than when she started this episode of care of physical therapy. She has been performing her HEP regularly with good results. She denies any paresthesia in her legs. The last time she had back pain was over the weekend after standing up all day at church. She had  less pain after she rested.  She had some hip pain yesterday, but not a while lot.     Pertinent History  Patient is a 73 y.o. female who presents to outpatient physical therapy with a referral for medical diagnosis of chronic right-sided low back pain without sciatica. This patient's chief complaints consist of pain in her right flank and lumbar region as well as hip/glute pain on the right, and reduced activity tolerance leading to the following functional deficits: difficulty with her usual ADLs, IADLs, household and community mobility, and community participation.     Limitations  Other (comment);Sitting   laying on that side feels like she is laying on something   Pain Onset  More than a month ago       TREATMENT: Denies latex allergies, was on prednisone for a month (longest she has been on it), denies diagnosed osteoporosis or osteopenia, history of cervical spine lamenectomy - denies any other spinal surgery. Denies history of cancer.  Therapeutic exercise:to centralize symptoms and improve ROM and strength required for successful completion of functional activities.  - Treadmill up to 1.5 mph with 0%  grade with SBA. For improved lower extremity mobility, muscular endurance, and weightbearing activity  tolerance; and to induce the analgesic effect of aerobic exercise, stimulate improved joint nutrition, and prepare body structures and systems for following interventions. X 5  Minutes during subjective exam. - Repeated lumbar extension in standing 2x 20.during =decreasing pain; after = abolished in hip. - standing cervical retraction x 20 with towel roll vertical behind thoracic spine against wall, decreased hand paresthesia to allow pt to better complete lumbar exercises and to improve posture. Cuing for technique, to consistently reach end range, and for coordination of cervical retraction to extension and back.  - seated cervical retraction to extension to decrease right hand  parestheia to allow pt to better complete lumbar exercises and to improve posture. Cuing for technique, to consistently reach end range, and for coordination of cervical retraction to extension and back. X 15 - Standing rows with scapular retraction for improved postural and shoulder girdle strengthening and mobility. Required instruction for technique and cuing to retract, posteriorly tilt, and depress scapulae. x30 with green theraband. To improve postural strength and back strength and activity tolerance. To promote pain reduction.  - Side stepping with green theraband wrapped around legs and held at umbilicus to activate core and hip stabilizers and abductors in functional weightbearing position. Cuing to activate abdominals. Stepping a few steps out and in as allowed by band x10 feet each side. CGA and gait belt for safety.  - Standing bilateral shoulder extension with scapular retraction for improved postural and shoulder girdle strengthening and mobility. Required instruction for technique and cuing to retract, posteriorly tilt, and depress scapulae. x30 with gren theraband anchored overhead. (low resistance to allow more attention to form).  - standing hip hikes with non-supporting leg dropping 2 inches off edge of step to heel tap on surface. x10 each side with UUE support. Cuing for form. To improve latera hip strength and activity tolerance to better support spine during single leg activities such as walking.  - Standing pallof press (multifidus press) with single blue theraband x20 each side. Cuing for trunk activation and form. To improve activity tolerance and strength for rotational forces during usual daily activities such as pushing or pulling with one hand. - Education on HEP including handout and green theraband   HOME EXERCISE PROGRAM Access Code: [email or web address]  URL: [email or web address]  Date: 08/02/2018  Prepared by: Norton BlizzardSara Drianna Chandran   Exercises  Standing Lumbar Extension  with Counter - 10-15 reps - 1 sets - 1 second hold - 6x daily  Seated Correct Posture  Seated Cervical Retraction - 10-15 reps - 1 second hold - 4-6x daily  Seated Cervical Retraction and Extension - 10-15 reps - 1 second hold - 4-6x daily  Standing Bilateral Low Shoulder Row with Anchored Resistance - 10-15 reps - 1 second hold - 3 Sets - 1x daily - 7x weekly    Patient response to treatment: Pt tolerated treatment well. Pt was able to complete all exercises with minimal to no lasting increase in pain or discomfort. She reported no change in paresthesia in right hand during session even with progressed specific exercise, so it was added to her HEP to assess the potential to get results over time. She continued to experienced abolishment of lumbar/hip irritation with repeated lumbar extension, so it was repeated throughout session. Pt demo improved safety on treadmill but still required close monitoring and gait belt for safety. Pt demo balance deficits and would benefit from incorporating balance training in her treatment plan. Several exercises today were designed to also improve balance in addition to  pain control and improved strength and activity tolerance, including hip hikes and side stepping. Trunk strengthening exercises were mildly progressed with good tolerance today. Were not progressed significantly due to significant DOMS reported after last session. Pt required cuing for proper technique and to facilitate improved neuromuscular control, strength, range of motion, and functional ability. She is making progress towards her goals at this point and reported no back or hip pain and reduced right arm paresthesia at end of session.      PT Education - 08/02/18 1028    Education Details  - Education on [employer] including handout and green theraband. purpose and form for exercises.     Person(s) Educated  Patient    Methods  Explanation;Demonstration;Tactile cues;Verbal cues;Handout     Comprehension  Verbalized understanding;Returned demonstration       PT Short Term Goals - 07/27/18 1628      PT SHORT TERM GOAL #1   Title  Be independent with initial home exercise program for self-management of symptoms.    Baseline  initial HEP provided at IE (07/26/2018)    Time  2    Period  Weeks    Status  New    Target Date  08/10/18        PT Long Term Goals - 08/02/18 1102      PT LONG TERM GOAL #1   Title  Be independent with a long-term home exercise program for self-management of symptoms    Baseline  initial HEP provided 07/26/2018    Time  6    Period  Weeks    Status  Achieved      PT LONG TERM GOAL #2   Title  Demonstrate improved FOTO score by 10 units to demonstrate improvement in overall condition and self-reported functional ability.     Baseline  FOTO = 63 (07/26/2018);    Time  6    Period  Weeks    Status  New      PT LONG TERM GOAL #3   Title  Patient will demonstrate BLE strength 5/5 to demonstrate functional strength for unlimited mobility, improved activity tolerance, IADLs, and social participation.     Baseline  see objective exam    Time  6    Period  Weeks    Status  New      PT LONG TERM GOAL #4   Title  Demonstrate full lumbar AROM with no increase in pain in all planes except intermittent end range discomfort to allow patient to complete valued activities with less difficulty.     Baseline  see objective exam    Time  6    Period  Weeks    Status  New      PT LONG TERM GOAL #5   Title  Complete community, work and/or recreational activities without limitation due to current condition.     Baseline  difficulty with her usual ADLs, IADLs, household and community mobility, and community participation.     Time  6    Period  Weeks    Status  New            Plan - 08/02/18 1101    Clinical Impression Statement  Patient has completed 3 treatment sessions so far this episode of care and is continuing to progress towards goals. She  notes her most significant improvement so far is a drastic reduction in right arm paresthesia, that is making it easier for her to focus on addressing lumbar and  hip symptoms. She has demonstrated good ability to control lumbar and hip symptom with specific exercises for an extension preference and is now working towards improving trunk, LE, and functional strength and activity tolerance to provide more long term reduction of symptoms and improved function. She also continues to be educated in self-management techniques and strategies for long term recovery when she is ready to discharge from this episode of skilled care. Patient is a 73 y.o. female referred to outpatient physical therapy with a medical diagnosis of chronic right-sided low back pain without sciatica who presents with signs and symptoms consistent with low back pain with referral to right LE. Patient demonstrates improving limitations in mobility, strength, and activity tolerance that leads to decreased ability to participate fully in previously enjoyed activities such as watching her grandson. She would benefit from skilled physical therapy to address her impairments and functional limitations to allow her to achieve PLOF or maximal function possible.     Rehab Potential  Good    Clinical Impairments Affecting Rehab Potential  (+) motivation, prior success with PT (-) possible visceral source of pain for flank symptoms, obesity, comorbidities    PT Frequency  2x / week    PT Duration  6 weeks    PT Treatment/Interventions  ADLs/Self Care Home Management;Aquatic Therapy;Biofeedback;Electrical Stimulation;Cryotherapy;Moist Heat;Gait training;Stair training;Functional mobility training;Therapeutic activities;Therapeutic exercise;Balance training;Neuromuscular re-education;Patient/family education;Manual techniques;Passive range of motion;Dry needling;Spinal Manipulations;Joint Manipulations;Other (comment)   joint mobilizations grades I-V, pain  neuroscience education   PT Next Visit Plan  assess response to HEP and modify as appropriate. Progress LE, trunk, and functional strengthening and mobility exercises as needed.  Start to incorporate more balance directed exercises.     PT Home Exercise Plan  Medbridge Access Code: [email or web address]     Consulted and Agree with Plan of Care  Patient       Patient will benefit from skilled therapeutic intervention in order to improve the following deficits and impairments:  Abnormal gait, Decreased activity tolerance, Decreased endurance, Decreased strength, Decreased range of motion, Impaired perceived functional ability, Hypomobility, Pain, Obesity, Postural dysfunction, Impaired flexibility, Decreased balance, Decreased mobility  Visit Diagnosis: Chronic right-sided low back pain without sciatica  Pain in right hip  Muscle weakness (generalized)     Problem List Patient Active Problem List   Diagnosis Date Noted  . Advanced care planning/counseling discussion 12/17/2016  . Skin lesions, generalized 11/13/2016  . Chronic right hip pain 06/17/2016  . Vitamin D deficiency 09/26/2015  . Diastasis recti 08/08/2015  . Elevated serum GGT level 07/24/2015  . Essential hypertension 07/17/2015  . Fatty liver 07/17/2015  . Elevated alkaline phosphatase level 07/17/2015  . Elevated serum glutamic pyruvic transaminase (SGPT) level 07/17/2015  . Abnormal finding on EKG 07/11/2015  . Morbid obesity (HCC) 07/11/2015  . Colon, diverticulosis 07/11/2015  . Abdominal wall hernia 07/11/2015  . DM (diabetes mellitus), type 2 with neurological complications (HCC)   . Hyperlipidemia     Cira Rue, PT, DPT 08/02/2018, 11:03 AM  [facility] AND SPORTS MEDICINE [address] [place], [address] Phone: [phone]   Fax:  [phone]  Name: Alyssa Mayo MRN: [id number] Date of Birth: [birth year]

## 2018-08-04 ENCOUNTER — Encounter: Payer: Self-pay | Admitting: Physical Therapy

## 2018-08-04 ENCOUNTER — Ambulatory Visit: Payer: Medicare HMO | Admitting: Physical Therapy

## 2018-08-04 DIAGNOSIS — M6281 Muscle weakness (generalized): Secondary | ICD-10-CM | POA: Diagnosis not present

## 2018-08-04 DIAGNOSIS — M25551 Pain in right hip: Secondary | ICD-10-CM | POA: Diagnosis not present

## 2018-08-04 DIAGNOSIS — M545 Low back pain: Principal | ICD-10-CM

## 2018-08-04 DIAGNOSIS — G8929 Other chronic pain: Secondary | ICD-10-CM

## 2018-08-04 NOTE — Therapy (Signed)
[facility] [address], [place], [address] Phone: [phone]   Fax:  [phone]  Physical Therapy Treatment  Patient Details  Name: Alyssa Mayo MRN: [id number] Date of Birth: 12-Jan-1946 Referring Provider (PT): Dorcas Carrow, [place]    Encounter Date: 08/04/2018  PT End of Session - 08/04/18 0953    Visit Number  4    Number of Visits  12    Date for PT Re-Evaluation  09/07/18    Authorization Type  [employer] HMO/PPO reporting period from 07/26/2018    Authorization Time Period  Current Cert period: 07/26/2018 - 09/07/2018 (last PN: [facility] 07/26/2018)    Authorization - Visit Number  4    Authorization - Number of Visits  10    PT Start Time  0950    PT Stop Time  1030    PT Time Calculation (min)  40 min    Equipment Utilized During Treatment  Gait belt    Activity Tolerance  Patient tolerated treatment well    Behavior During Therapy  [facility] for tasks assessed/performed       Past Medical History:  Diagnosis Date  . Arthritis   . Diabetes mellitus without complication (HCC)   . Elevated liver enzymes   . Fatty liver   . Hyperlipidemia   . Lifelong obesity   . Sleep apnea     Past Surgical History:  Procedure Laterality Date  . ABDOMINAL HYSTERECTOMY    . APPENDECTOMY    . CERVICAL LAMINECTOMY  1987  . CESAREAN SECTION    . COLONOSCOPY  2014   diverticulosis, [facility] Dr. [name]   . SPINE SURGERY    . TUBAL LIGATION      There were no vitals filed for this visit.  Subjective Assessment - 08/04/18 0949    Subjective  Patient reports "I cannot believe how much better my arm feels." She states her arm symptoms are not completely gone but are significantly better. She has 0.5 pain in the right hip upon arrival. She reports mild soreness across her shoulders following last session but no excesisve soreness or pain. She reports compliance with HEP.     Pertinent History  Patient is a 73 y.o.  female who presents to outpatient physical therapy with a referral for medical diagnosis of chronic right-sided low back pain without sciatica. This patient's chief complaints consist of pain in her right flank and lumbar region as well as hip/glute pain on the right, and reduced activity tolerance leading to the following functional deficits: difficulty with her usual ADLs, IADLs, household and community mobility, and community participation.     Limitations  Other (comment);Sitting   laying on that side feels like she is laying on something   Currently in Pain?  Yes    Pain Score  1     Pain Location  Hip    Pain Orientation  Right    Pain Descriptors / Indicators  Aching;Sharp    Pain Type  Chronic pain    Pain Radiating Towards  right flank to right glute/posterior hip    Pain Onset  More than a month ago    Pain Frequency  Constant        TREATMENT: Denies latex allergies, was on prednisone for a month (longest she has been on it), denies diagnosed osteoporosis or osteopenia, history of cervical spine lamenectomy - denies any other spinal surgery. Denies history of cancer.  Therapeutic exercise:to centralize  symptoms and improve ROM and strength required for successful completion of functional activities. -Treadmillup to 2 mph with 0%grade with SBA, and no UE support for most of it.For improved lower extremity mobility, muscular endurance, and weightbearing activity tolerance; and to induce the analgesic effect of aerobic exercise, stimulate improved joint nutrition, and prepare body structures and systems for following interventions. X during subjective exam. - Repeated lumbar extension in standing4x 20 throughout session to decrease any lumbar irritation and as a prophylactic.during =decreasing pain; after = abolished in hip.  - standing cervical retraction to extension to decrease right hand parestheia to allow pt to better complete lumbar exercises and to improve  posture. Cuing for technique, to consistently reach end range, and for coordination of cervical retraction to extension and back. X 15 -Standing rows with scapular retraction for improved postural and shoulder girdle strengthening and mobility. Required instruction for technique and cuing to retract, posteriorly tilt, and depress scapulae.x10 with green theraband, x20 blue theraband. To improve postural strength and back strength and activity tolerance. To promote pain reduction.  -Side stepping withgreentheraband wrapped around legs and held at umbilicus to activate core and hip stabilizers and abductors in functional weightbearing position. Cuing to activate abdominals. Stepping a few steps out and in as allowed by band x10 feeteach side. CGA and gait belt for safety.  -Standingbilateral shoulder extensionwith scapular retraction for improved postural and shoulder girdle strengthening and mobility. Required instruction for technique and cuing to retract, posteriorly tilt, and depress scapulae.x30 with green theraband anchored overhead. (low resistance to allow more attention to form).  - standing hip hikes with non-supporting leg dropping 2 inches off edge of step to heel tap on surface. 2x10 each side with UUE support. Cuing for form, especially to keep weightbearing knee extended and isolate hip motion. To improve latera hip strength and activity tolerance to better support spine during single leg activities such as walking.  -Standing pallof press (multifidus press) withsingle bluetheraband x30each side. Cuing for trunk activation and form.To improve activity tolerance and strength for rotational forces during usual daily activities such as pushing or pulling with one hand. - standing double toe taps on 8 inch cones, alternating sides, with unilateral UE touchdown support, gait belt and SBA for safety, to improve balance with stepping activities after noting pt having difficulty balancing  on one foot while trying to set up band for ide stepping.  Cuing for technique, hand hold, and counting. x 20 each side   HOME EXERCISE PROGRAM Access Code: [email or web address]  URL: [email or web address]  Date: 08/02/2018  Prepared by: Norton Blizzard   Exercises   Standing Lumbar Extension with Counter - 10-15 reps - 1 sets - 1 second hold - 6x daily   Seated Correct Posture   Seated Cervical Retraction - 10-15 reps - 1 second hold - 4-6x daily   Seated Cervical Retraction and Extension - 10-15 reps - 1 second hold - 4-6x daily   Standing Bilateral Low Shoulder Row with Anchored Resistance - 10-15 reps - 1 second hold - 3 Sets - 1x daily - 7x weekly    Patient response to treatment: Pt tolerated treatment well. Pt was able to complete all exercises with minimal to no lasting increase in pain or discomfort. Pt demo improved safety and speed on treadmill but still required close monitoring for safety. Pt demo balance deficits and intervention to improve balance were introduced today. Improved balance will also decrease potential from increased back pain from falls or unexpected instability of  lower extremity support. Trunk, LE, and functional strengthening exercises were mildly progressed with good tolerance today. Were not progressed significantly due to significant DOMS reported after last session.Pt required cuing for proper technique and to facilitate improved neuromuscular control, strength, range of motion, and functional ability.  PT Education - 08/04/18 1610    Education Details  exericse form/purpose, advice for self-management.     Person(s) Educated  Patient    Methods  Explanation;Demonstration;Tactile cues;Verbal cues    Comprehension  Verbalized understanding;Returned demonstration         PT Short Term Goals - 08/04/18 1053      PT SHORT TERM GOAL #1   Title  Be independent with initial home exercise program for self-management of symptoms.    Baseline   initial HEP provided at [facility] (07/26/2018)    Time  2    Period  Weeks    Status  Achieved    Target Date  08/10/18        PT Long Term Goals - 08/02/18 1102      PT LONG TERM GOAL #1   Title  Be independent with a long-term home exercise program for self-management of symptoms    Baseline  initial HEP provided 07/26/2018    Time  6    Period  Weeks    Status  Achieved      PT LONG TERM GOAL #2   Title  Demonstrate improved FOTO score by 10 units to demonstrate improvement in overall condition and self-reported functional ability.     Baseline  FOTO = 63 (07/26/2018);    Time  6    Period  Weeks    Status  New      PT LONG TERM GOAL #3   Title  Patient will demonstrate BLE strength 5/5 to demonstrate functional strength for unlimited mobility, improved activity tolerance, IADLs, and social participation.     Baseline  see objective exam    Time  6    Period  Weeks    Status  New      PT LONG TERM GOAL #4   Title  Demonstrate full lumbar AROM with no increase in pain in all planes except intermittent end range discomfort to allow patient to complete valued activities with less difficulty.     Baseline  see objective exam    Time  6    Period  Weeks    Status  New      PT LONG TERM GOAL #5   Title  Complete community, work and/or recreational activities without limitation due to current condition.     Baseline  difficulty with her usual ADLs, IADLs, household and community mobility, and community participation.     Time  6    Period  Weeks    Status  New            Plan - 08/04/18 1052    Clinical Impression Statement  Patient has completed 4 treatment sessions so far this episode of care and is continuing to progress towards goals. She continues to report her most significant improvement so far is a drastic reduction in right arm paresthesia, that is making it easier for her to focus on addressing lumbar and hip symptoms. She continues to demonstrate good ability to  control lumbar and hip symptom with specific exercises for an extension preference and is continuing to  work towards improving trunk, LE, and functional strength and activity tolerance to provide more long term reduction of  symptoms and improved function. She also continues to be educated in self-management techniques and strategies for long term recovery when she is ready to discharge from this episode of skilled care. Balance interventions were also introduced today after pt demonstrated impaired balance during basic mobility tasks. Patient is a 73 y.o. female referred to outpatient physical therapy with a medical diagnosis of chronic right-sided low back pain without sciatica who presents with signs and symptoms consistent with low back pain with referral to right LE. Patient demonstrates improving limitations in mobility, strength, and activity tolerance that leads to decreased ability to participate fully in previously enjoyed activities such as watching her grandson. She would benefit from skilled physical therapy to address her impairments and functional limitations to allow her to achieve PLOF or maximal function possible.    Rehab Potential  Good    Clinical Impairments Affecting Rehab Potential  (+) motivation, prior success with PT (-) possible visceral source of pain for flank symptoms, obesity, comorbidities    PT Frequency  2x / week    PT Duration  6 weeks    PT Treatment/Interventions  ADLs/Self Care Home Management;Aquatic Therapy;Biofeedback;Electrical Stimulation;Cryotherapy;Moist Heat;Gait training;Stair training;Functional mobility training;Therapeutic activities;Therapeutic exercise;Balance training;Neuromuscular re-education;Patient/family education;Manual techniques;Passive range of motion;Dry needling;Spinal Manipulations;Joint Manipulations;Other (comment)   joint mobilizations grades I-V, pain neuroscience education   PT Next Visit Plan  assess response to HEP and modify as  appropriate. Progress LE, trunk, and functional strengthening and mobility exercises as needed. incorporate more balance directed exercises.     PT Home Exercise Plan  Medbridge Access Code: [email or web address]     Consulted and Agree with Plan of Care  Patient       Patient will benefit from skilled therapeutic intervention in order to improve the following deficits and impairments:  Abnormal gait, Decreased activity tolerance, Decreased endurance, Decreased strength, Decreased range of motion, Impaired perceived functional ability, Hypomobility, Pain, Obesity, Postural dysfunction, Impaired flexibility, Decreased balance, Decreased mobility  Visit Diagnosis: Chronic right-sided low back pain without sciatica  Pain in right hip  Muscle weakness (generalized)     Problem List Patient Active Problem List   Diagnosis Date Noted  . Advanced care planning/counseling discussion 12/17/2016  . Skin lesions, generalized 11/13/2016  . Chronic right hip pain 06/17/2016  . Vitamin D deficiency 09/26/2015  . Diastasis recti 08/08/2015  . Elevated serum GGT level 07/24/2015  . Essential hypertension 07/17/2015  . Fatty liver 07/17/2015  . Elevated alkaline phosphatase level 07/17/2015  . Elevated serum glutamic pyruvic transaminase (SGPT) level 07/17/2015  . Abnormal finding on EKG 07/11/2015  . Morbid obesity (HCC) 07/11/2015  . Colon, diverticulosis 07/11/2015  . Abdominal wall hernia 07/11/2015  . DM (diabetes mellitus), type 2 with neurological complications (HCC)   . Hyperlipidemia     Cira RueSara R Virgle Arth, PT, DPT 08/04/2018, 10:54 AM  [facility] [address] [place], [place], [address] Phone: [phone]   Fax:  [phone]  Name: Alyssa Mayo MRN: [id number] Date of Birth: 02/15/1946

## 2018-08-09 ENCOUNTER — Encounter: Payer: Self-pay | Admitting: Physical Therapy

## 2018-08-09 ENCOUNTER — Ambulatory Visit: Payer: Medicare HMO | Admitting: Physical Therapy

## 2018-08-09 DIAGNOSIS — M6281 Muscle weakness (generalized): Secondary | ICD-10-CM

## 2018-08-09 DIAGNOSIS — M545 Low back pain: Principal | ICD-10-CM

## 2018-08-09 DIAGNOSIS — M25551 Pain in right hip: Secondary | ICD-10-CM

## 2018-08-09 DIAGNOSIS — G8929 Other chronic pain: Secondary | ICD-10-CM

## 2018-08-09 NOTE — Therapy (Signed)
[facility] [address], [place], [address] Phone: [phone]   Fax:  [phone]  Physical Therapy Treatment  Patient Details  Name: Alyssa Mayo MRN: [id number] Date of Birth: 11/21/1945 Referring Provider (PT): Dorcas Carrow, [place]    Encounter Date: 08/09/2018  PT End of Session - 08/09/18 0954    Visit Number  5    Number of Visits  12    Date for PT Re-Evaluation  09/07/18    Authorization Type  [employer] HMO/PPO reporting period from 07/26/2018    Authorization Time Period  Current Cert period: 07/26/2018 - 09/07/2018 (last PN: [facility] 07/26/2018)    Authorization - Visit Number  5    Authorization - Number of Visits  10    PT Start Time  [phone]    PT Stop Time  1027    PT Time Calculation (min)  40 min    Equipment Utilized During Treatment  Gait belt    Activity Tolerance  Patient tolerated treatment well    Behavior During Therapy  [facility] for tasks assessed/performed       Past Medical History:  Diagnosis Date  . Arthritis   . Diabetes mellitus without complication (HCC)   . Elevated liver enzymes   . Fatty liver   . Hyperlipidemia   . Lifelong obesity   . Sleep apnea     Past Surgical History:  Procedure Laterality Date  . ABDOMINAL HYSTERECTOMY    . APPENDECTOMY    . CERVICAL LAMINECTOMY  1987  . CESAREAN SECTION    . COLONOSCOPY  2014   diverticulosis, [facility] Dr. [name]   . SPINE SURGERY    . TUBAL LIGATION      There were no vitals filed for this visit.  Subjective Assessment - 08/09/18 0948    Subjective  Patient reports no pain upon arrival but states she had some hip pain over the weekend. She notes her arm would be better if she had done "what I was supposed to" over the weekend. Her grandson came down with influenza this weekend and she has been helping take care of him this weekend. Pt has not had the flu shot. She did some of her HEP but not as she should. Reports mild but  not excessive soreness and no increased pain following last treatment session.     Pertinent History  Patient is a 73 y.o. female who presents to outpatient physical therapy with a referral for medical diagnosis of chronic right-sided low back pain without sciatica. This patient's chief complaints consist of pain in her right flank and lumbar region as well as hip/glute pain on the right, and reduced activity tolerance leading to the following functional deficits: difficulty with her usual ADLs, IADLs, household and community mobility, and community participation.     Limitations  Other (comment);Sitting   laying on that side feels like she is laying on something   Currently in Pain?  No/denies    Pain Onset  More than a month ago          TREATMENT: Denies latex allergies, was on prednisone for a month (longest she has been on it), denies diagnosed osteoporosis or osteopenia, history of cervical spine lamenectomy - denies any other spinal surgery. Denies history of cancer.  Therapeutic exercise:to centralize symptoms and improve ROM and strength required for successful completion of functional activities. -Treadmillup to with 0%grade with SBA, and no UE support for most  of it.For improved lower extremity mobility, muscular endurance, and weightbearing activity tolerance; and to induce the analgesic effect of aerobic exercise, stimulate improved joint nutrition, and prepare body structures and systems for following interventions. X 5Minutes during subjective exam. - Repeated lumbar extension in standing2x15 throughout session to decrease any lumbar irritation and as a prophylactic.during =decreasing pain; after = abolished in hip.  -Standing rows with scapular retraction for improved postural and shoulder girdle strengthening and mobility. Required instruction for technique and cuing to retract, posteriorly tilt, and depress scapulae.x10 and x 20 at 15# To improve postural  strength and back strength and activity tolerance. To promote pain reduction. - standing hip hikes with non-supporting leg dropping 2 inches off edge of step to heel tap on surface. 3x10 each side with UUE support. Cuing for form, especially to keep weightbearing knee extended and isolate hip motion. To improve latera hip strength and activity tolerance to better support spine during single leg activities such as walking. -Standing pallof press (multifidus press).  Cuing for trunk activation and form.To improve activity tolerance and strength for rotational forces during usual daily activities such as pushing or pulling with one hand. 2x10 each side at 10#.  - standing double toe taps on 8 inch cones, alternating sides, with unilateral UE touchdown support, gait belt and SBA for safety, to improve balance with stepping activities after noting pt having difficulty balancing on one foot while trying to set up band for ide stepping.  Cuing for technique, hand hold, and counting. x 20 each side FW, x 20 each side crossing.  - step up to 6 inch step with SLS balance for 1 second on top of step and touch down UUE support. CGA and gait belt for safety. X10 on each side. To improve functional SLS strength and balance. Cuing for balance.    HOME EXERCISE PROGRAM Access Code: [email or web address]  URL: [email or web address]  Date: 08/02/2018  Prepared by: Norton BlizzardSara Estela Vinal   Exercises   Standing Lumbar Extension with Counter - 10-15 reps - 1 sets - 1 second hold - 6x daily   Seated Correct Posture   Seated Cervical Retraction - 10-15 reps - 1 second hold - 4-6x daily   Seated Cervical Retraction and Extension - 10-15 reps - 1 second hold - 4-6x daily   Standing Bilateral Low Shoulder Row with Anchored Resistance - 10-15 reps - 1 second hold - 3 Sets - 1x daily - 7x weekly   Patient response to treatment: Pt tolerated treatment well. Pt was able to complete all exercises with minimal to no  lasting increase in pain or discomfort. Pt demo improved safety and speed on treadmill but still required close monitoring for safety. Pt demo balance deficits and intervention to improve balance were progressed today with good tolerance. Improved balance will also decrease potential from increased back pain from falls or unexpected instability of lower extremity support. Trunk, LE, and functional strengthening exercises weremildly progressedwith good tolerance today.Pt required cuing for proper technique and to facilitate improved neuromuscular control, strength, range of motion, and functional ability.  PT Education - 08/09/18 0954    Education Details   Exercise purpose/form. Self management techniques. Education on diagnosis, prognosis, POC, anatomy and physiology of current condition    Person(s) Educated  Patient    Methods  Explanation;Demonstration;Tactile cues;Verbal cues    Comprehension  Verbalized understanding;Returned demonstration          PT Short Term Goals - 08/04/18 1053      PT SHORT  TERM GOAL #1   Title  Be independent with initial home exercise program for self-management of symptoms.    Baseline  initial HEP provided at IE (07/26/2018)    Time  2    Period  Weeks    Status  Achieved    Target Date  08/10/18        PT Long Term Goals - 08/02/18 1102      PT LONG TERM GOAL #1   Title  Be independent with a long-term home exercise program for self-management of symptoms    Baseline  initial HEP provided 07/26/2018    Time  6    Period  Weeks    Status  Achieved      PT LONG TERM GOAL #2   Title  Demonstrate improved FOTO score by 10 units to demonstrate improvement in overall condition and self-reported functional ability.     Baseline  FOTO = 63 (07/26/2018);    Time  6    Period  Weeks    Status  New      PT LONG TERM GOAL #3   Title  Patient will demonstrate BLE strength 5/5 to demonstrate functional strength for unlimited mobility, improved activity  tolerance, IADLs, and social participation.     Baseline  see objective exam    Time  6    Period  Weeks    Status  New      PT LONG TERM GOAL #4   Title  Demonstrate full lumbar AROM with no increase in pain in all planes except intermittent end range discomfort to allow patient to complete valued activities with less difficulty.     Baseline  see objective exam    Time  6    Period  Weeks    Status  New      PT LONG TERM GOAL #5   Title  Complete community, work and/or recreational activities without limitation due to current condition.     Baseline  difficulty with her usual ADLs, IADLs, household and community mobility, and community participation.     Time  6    Period  Weeks    Status  New            Plan - 08/09/18 1031    Clinical Impression Statement  Patient has completed 5 treatment sessions so far this episode of care and is continuing to progress towards goals. She continues to report her most significant improvement so far is a drastic reduction in right arm paresthesia, that is making it easier for her to focus on addressing lumbar and hip symptoms. She continues to demonstrate good ability to control lumbar and hip symptom with specific exercises for an extension preference and is continuing to work towards improving trunk, LE, and functional strength and activity tolerance to provide more long term reduction of symptoms and improved function. She also continues to be educated in self-management techniques and strategies for long term recovery when she is ready to discharge from this episode of skilled care. Balance interventions were also progressed  As pt demonstrated impaired balance during basic mobility tasks in this and past sessions. Patient is a 73 y.o. female referred to outpatient physical therapy with a medical diagnosis of chronic right-sided low back pain without sciatica who presents with signs and symptoms consistent with low back pain with referral to right  LE. Patient demonstrates improving limitations in mobility, strength, and activity tolerance that leads to decreased ability to participate fully in previously  enjoyed activities such as watching her grandson. She would benefit from skilled physical therapy to address her impairments and functional limitations to allow her to achieve PLOF or maximal function possible.    Rehab Potential  Good    Clinical Impairments Affecting Rehab Potential  (+) motivation, prior success with PT (-) possible visceral source of pain for flank symptoms, obesity, comorbidities    PT Frequency  2x / week    PT Duration  6 weeks    PT Treatment/Interventions  ADLs/Self Care Home Management;Aquatic Therapy;Biofeedback;Electrical Stimulation;Cryotherapy;Moist Heat;Gait training;Stair training;Functional mobility training;Therapeutic activities;Therapeutic exercise;Balance training;Neuromuscular re-education;Patient/family education;Manual techniques;Passive range of motion;Dry needling;Spinal Manipulations;Joint Manipulations;Other (comment)   joint mobilizations grades I-V, pain neuroscience education   PT Next Visit Plan  assess response to HEP and modify as appropriate. Progress LE, trunk, and functional strengthening and mobility exercises as needed. incorporate more balance directed exercises.     PT Home Exercise Plan  Medbridge Access Code: [email or web address]     Consulted and Agree with Plan of Care  Patient       Patient will benefit from skilled therapeutic intervention in order to improve the following deficits and impairments:  Abnormal gait, Decreased activity tolerance, Decreased endurance, Decreased strength, Decreased range of motion, Impaired perceived functional ability, Hypomobility, Pain, Obesity, Postural dysfunction, Impaired flexibility, Decreased balance, Decreased mobility  Visit Diagnosis: Chronic right-sided low back pain without sciatica  Pain in right hip  Muscle weakness  (generalized)     Problem List Patient Active Problem List   Diagnosis Date Noted  . Advanced care planning/counseling discussion 12/17/2016  . Skin lesions, generalized 11/13/2016  . Chronic right hip pain 06/17/2016  . Vitamin D deficiency 09/26/2015  . Diastasis recti 08/08/2015  . Elevated serum GGT level 07/24/2015  . Essential hypertension 07/17/2015  . Fatty liver 07/17/2015  . Elevated alkaline phosphatase level 07/17/2015  . Elevated serum glutamic pyruvic transaminase (SGPT) level 07/17/2015  . Abnormal finding on EKG 07/11/2015  . Morbid obesity (HCC) 07/11/2015  . Colon, diverticulosis 07/11/2015  . Abdominal wall hernia 07/11/2015  . DM (diabetes mellitus), type 2 with neurological complications (HCC)   . Hyperlipidemia     Cira Rue, PT, DPT 08/09/2018, 10:32 AM  [place] [facility] [address] [place], [address] Phone: [phone]   Fax:  [phone]  Name: Ryna Beckstrom MRN: [id number] Date of Birth: 15-Feb-1946

## 2018-08-11 ENCOUNTER — Ambulatory Visit: Payer: Medicare HMO | Admitting: Physical Therapy

## 2018-08-11 ENCOUNTER — Encounter: Payer: Self-pay | Admitting: Physical Therapy

## 2018-08-11 DIAGNOSIS — M545 Low back pain, unspecified: Secondary | ICD-10-CM

## 2018-08-11 DIAGNOSIS — G8929 Other chronic pain: Secondary | ICD-10-CM

## 2018-08-11 DIAGNOSIS — M6281 Muscle weakness (generalized): Secondary | ICD-10-CM | POA: Diagnosis not present

## 2018-08-11 DIAGNOSIS — M25551 Pain in right hip: Secondary | ICD-10-CM

## 2018-08-11 NOTE — Therapy (Signed)
[facility] [address] [place], [place], [address] Phone: [phone]   Fax:  [phone]  Physical Therapy Treatment  Patient Details  Name: Alyssa CutterMary Louise Mayo MRN: [id number] Date of Birth: 02/20/1946 Referring Provider (PT): Dorcas CarrowJohnson, Megan P, [place]    Encounter Date: 08/11/2018  PT End of Session - 08/11/18 0951    Visit Number  6    Number of Visits  12    Date for PT Re-Evaluation  09/07/18    Authorization Type  [employer] HMO/PPO reporting period from 07/26/2018    Authorization Time Period  Current Cert period: 07/26/2018 - 09/07/2018 (last PN: [facility] 07/26/2018)    Authorization - Visit Number  6    Authorization - Number of Visits  10    PT Start Time  [phone]    PT Stop Time  1027    PT Time Calculation (min)  40 min    Equipment Utilized During Treatment  Gait belt    Activity Tolerance  Patient tolerated treatment well;Patient limited by fatigue    Behavior During Therapy  [facility] for tasks assessed/performed       Past Medical History:  Diagnosis Date  . Arthritis   . Diabetes mellitus without complication (HCC)   . Elevated liver enzymes   . Fatty liver   . Hyperlipidemia   . Lifelong obesity   . Sleep apnea     Past Surgical History:  Procedure Laterality Date  . ABDOMINAL HYSTERECTOMY    . APPENDECTOMY    . CERVICAL LAMINECTOMY  1987  . CESAREAN SECTION    . COLONOSCOPY  2014   diverticulosis, [facility] Dr. Ricki RodriguezSkulski   . SPINE SURGERY    . TUBAL LIGATION      There were no vitals filed for this visit.  Subjective Assessment - 08/11/18 0947    Subjective  Patient reports she is feeling well upon arrival. She states her right hip has been hurting her some but feels that the exercises have been helping. Rates it as 1/10 upon arrival. She was mildly sore across the upper trap region. following last treatment session. She states she did her HEP 3-4 times yesterday.     Pertinent History  Patient is a [age]  y.o. female who presents to outpatient physical therapy with a referral for medical diagnosis of chronic right-sided low back pain without sciatica. This patient's chief complaints consist of pain in her right flank and lumbar region as well as hip/glute pain on the right, and reduced activity tolerance leading to the following functional deficits: difficulty with her usual ADLs, IADLs, household and community mobility, and community participation.     Limitations  Other (comment);Sitting   laying on that side feels like she is laying on something   Pain Location  Hip    Pain Orientation  Right    Pain Descriptors / Indicators  Sharp    Pain Type  Chronic pain    Pain Onset  More than a month ago          PT Education - 08/11/18 0950    Education Details  Exercise purpose/form. Self management techniques. Education on diagnosis, prognosis, POC, anatomy and physiology of current condition    Person(s) Educated  Patient    Methods  Explanation;Demonstration;Tactile cues;Verbal cues    Comprehension  Verbalized understanding;Returned demonstration       TREATMENT: Denies latex allergies, was on prednisone for a month (longest she has been on it),  denies diagnosed osteoporosis or osteopenia, history of cervical spine lamenectomy - denies any other spinal surgery. Denies history of cancer.  Therapeutic exercise:to centralize symptoms and improve ROM and strength required for successful completion of functional activities. -Treadmillup to51mph with 0%grade with SBA, and no UE support for most of it.For improved lower extremity mobility, muscular endurance, and weightbearing activity tolerance; and to induce the analgesic effect of aerobic exercise, stimulate improved joint nutrition, and prepare body structures and systems for following interventions. X during subjective exam. - Repeated lumbar extension in standingx20, x15, x15throughout session to decrease any lumbar  irritation and as a prophylactic.during =decreasing pain; after = abolished in hip.  - prone press up lumbar extension x 10 following manual. No effect but started pain free. To improve hip pain and prevent it from returning as easily. Cuing for relaxation of lumbar and glute muscles and to attempted bilateral elbow extension.  -Standing rows with scapular retraction for improved postural and shoulder girdle strengthening and mobility. Required instruction for technique and cuing to retract, posteriorly tilt, and depress scapulae To improve postural strength and back strength and activity tolerance. X16, x20 at 15# - hip abduction at hip machine. Facing out to prevent compensatory hip flexion. Cuing for posture and to lead with heel. To improve lateral hip strength and muscle endurance for single leg stance activities such as walking and stepping.  2x10 each side  Manual therapy: to reduce pain and tissue tension, improve range of motion, neuromodulation, in order to promote improved ability to complete functional activities. - Prone CPA along upper thoracic and entire lumbar spine grade II-IV, x 10 each segment with wedge assist. To improve mobility and reduce pain.  - STM over posterior lumbar and thoracic musculature to decrease muscle tension for pain  Relief and allow her to tolerate joint mobilizations better.  HOME EXERCISE PROGRAM Access Code: [email or web address]  URL: [email or web address]  Date: 08/02/2018  Prepared by: Norton Blizzard   Exercises   Standing Lumbar Extension with Counter - 10-15 reps - 1 sets - 1 second hold - 6x daily   Seated Correct Posture   Seated Cervical Retraction - 10-15 reps - 1 second hold - 4-6x daily   Seated Cervical Retraction and Extension - 10-15 reps - 1 second hold - 4-6x daily   Standing Bilateral Low Shoulder Row with Anchored Resistance - 10-15 reps - 1 second hold - 3 Sets - 1x daily - 7x weekly   Patient response to treatment: Pt  tolerated treatment well. Pt was able to complete all exercises with minimal to no lasting increase in pain or discomfort. She had some onset of R hip pain duirng treadmill exercise that was abolished with repeated lumbar extension in standing and manual therapy was applied to improve effectiveness of extension exercises. Patient quite tender to CPA at mid and upper thoracic spine with report of "a connection" between that region and her right hip discomfort. Able to perform prone press ups following demonstrating significant lumbar and thoracic stiffness. Pt demo improved safetyand speedon treadmill but still required close monitoring for safety.Trunk, LE, and functionalstrengthening exercises werecontinued with good tolerance today.Pt required cuing for proper technique and to facilitate improved neuromuscular control, strength, range of motion, and functional ability.    PT Short Term Goals - 08/04/18 1053      PT SHORT TERM GOAL #1   Title  Be independent with initial home exercise program for self-management of symptoms.    Baseline  initial HEP provided at IE (  07/26/2018)    Time  2    Period  Weeks    Status  Achieved    Target Date  08/10/18        PT Long Term Goals - 08/02/18 1102      PT LONG TERM GOAL #1   Title  Be independent with a long-term home exercise program for self-management of symptoms    Baseline  initial HEP provided 07/26/2018    Time  6    Period  Weeks    Status  Achieved      PT LONG TERM GOAL #2   Title  Demonstrate improved FOTO score by 10 units to demonstrate improvement in overall condition and self-reported functional ability.     Baseline  FOTO = 63 (07/26/2018);    Time  6    Period  Weeks    Status  New      PT LONG TERM GOAL #3   Title  Patient will demonstrate BLE strength 5/5 to demonstrate functional strength for unlimited mobility, improved activity tolerance, IADLs, and social participation.     Baseline  see objective exam    Time  6     Period  Weeks    Status  New      PT LONG TERM GOAL #4   Title  Demonstrate full lumbar AROM with no increase in pain in all planes except intermittent end range discomfort to allow patient to complete valued activities with less difficulty.     Baseline  see objective exam    Time  6    Period  Weeks    Status  New      PT LONG TERM GOAL #5   Title  Complete community, work and/or recreational activities without limitation due to current condition.     Baseline  difficulty with her usual ADLs, IADLs, household and community mobility, and community participation.     Time  6    Period  Weeks    Status  New            Plan - 08/11/18 1029    Clinical Impression Statement  Patient has completed 6 treatment sessions so far this episode of care and is continuing to progress towards goals. She continues to demonstrate good ability to control lumbar and hip symptom with specific exercises for an extension preference and is continuing to work towards improving trunk, LE, and functional strength and activity tolerance to provide more long term reduction of symptoms and improved function. She is showing some difficulty maintaining lasting improvement with hip pain so force was progressed to manual CPA to promote longer term effectiveness of specific exercises.  Patient is a 73 y.o. female referred to outpatient physical therapy with a medical diagnosis of chronic right-sided low back pain without sciatica who presents with signs and symptoms consistent with low back pain with referral to right LE. Patient demonstrates improving limitations in mobility, strength, and activity tolerance that leads to decreased ability to participate fully in previously enjoyed activities such as watching her grandson. She would benefit from skilled physical therapy to address her impairments and functional limitations to allow her to achieve PLOF or maximal function possible.    Rehab Potential  Good    Clinical  Impairments Affecting Rehab Potential  (+) motivation, prior success with PT (-) possible visceral source of pain for flank symptoms, obesity, comorbidities    PT Frequency  2x / week    PT Duration  6 weeks  PT Treatment/Interventions  ADLs/Self Care Home Management;Aquatic Therapy;Biofeedback;Electrical Stimulation;Cryotherapy;Moist Heat;Gait training;Stair training;Functional mobility training;Therapeutic activities;Therapeutic exercise;Balance training;Neuromuscular re-education;Patient/family education;Manual techniques;Passive range of motion;Dry needling;Spinal Manipulations;Joint Manipulations;Other (comment)   joint mobilizations grades I-V, pain neuroscience education   PT Next Visit Plan  assess response to HEP and modify as appropriate. Progress LE, trunk, and functional strengthening and mobility exercises as needed. incorporate more balance directed exercises.     PT Home Exercise Plan  Medbridge Access Code: [email or web address]     Consulted and Agree with Plan of Care  Patient       Patient will benefit from skilled therapeutic intervention in order to improve the following deficits and impairments:  Abnormal gait, Decreased activity tolerance, Decreased endurance, Decreased strength, Decreased range of motion, Impaired perceived functional ability, Hypomobility, Pain, Obesity, Postural dysfunction, Impaired flexibility, Decreased balance, Decreased mobility  Visit Diagnosis: Chronic right-sided low back pain without sciatica  Pain in right hip  Muscle weakness (generalized)     Problem List Patient Active Problem List   Diagnosis Date Noted  . Advanced care planning/counseling discussion 12/17/2016  . Skin lesions, generalized 11/13/2016  . Chronic right hip pain 06/17/2016  . Vitamin D deficiency 09/26/2015  . Diastasis recti 08/08/2015  . Elevated serum GGT level 07/24/2015  . Essential hypertension 07/17/2015  . Fatty liver 07/17/2015  . Elevated alkaline phosphatase  level 07/17/2015  . Elevated serum glutamic pyruvic transaminase (SGPT) level 07/17/2015  . Abnormal finding on EKG 07/11/2015  . Morbid obesity (HCC) 07/11/2015  . Colon, diverticulosis 07/11/2015  . Abdominal wall hernia 07/11/2015  . DM (diabetes mellitus), type 2 with neurological complications (HCC)   . Hyperlipidemia     Cira RueSara R Renee Beale, PT, DPT 08/11/2018, 10:29 AM  [facility] [address] [place], [place], [address] Phone: [phone]   Fax:  [phone]  Name: Alyssa CutterMary Louise Mayo MRN: [id number] Date of Birth: 08/27/1945

## 2018-08-16 ENCOUNTER — Ambulatory Visit: Payer: Medicare HMO | Admitting: Physical Therapy

## 2018-08-16 ENCOUNTER — Encounter: Payer: Self-pay | Admitting: Physical Therapy

## 2018-08-16 DIAGNOSIS — M545 Low back pain, unspecified: Secondary | ICD-10-CM

## 2018-08-16 DIAGNOSIS — M6281 Muscle weakness (generalized): Secondary | ICD-10-CM | POA: Diagnosis not present

## 2018-08-16 DIAGNOSIS — G8929 Other chronic pain: Secondary | ICD-10-CM

## 2018-08-16 DIAGNOSIS — M25551 Pain in right hip: Secondary | ICD-10-CM | POA: Diagnosis not present

## 2018-08-16 NOTE — Therapy (Signed)
[facility] PHYSICAL AND SPORTS MEDICINE [address] [place], [address] Phone: [phone]   Fax:  [phone]  Physical Therapy Treatment  Patient Details  Name: Alyssa Mayo MRN: [id number] Date of Birth: 1945-08-29 Referring Provider (PT): Dorcas Carrow, [place]    Encounter Date: 08/16/2018  PT End of Session - 08/16/18 0957    Visit Number  7    Number of Visits  12    Date for PT Re-Evaluation  09/07/18    Authorization Type  [employer] HMO/PPO reporting period from 07/26/2018    Authorization Time Period  Current Cert period: 07/26/2018 - 09/07/2018 (last PN: [facility] 07/26/2018)    Authorization - Visit Number  7    Authorization - Number of Visits  10    PT Start Time  [phone]   pt was late   PT Stop Time  1033    PT Time Calculation (min)  38 min    Equipment Utilized During Treatment  Gait belt    Activity Tolerance  Patient tolerated treatment well;Patient limited by fatigue    Behavior During Therapy  [facility] for tasks assessed/performed       Past Medical History:  Diagnosis Date  . Arthritis   . Diabetes mellitus without complication (HCC)   . Elevated liver enzymes   . Fatty liver   . Hyperlipidemia   . Lifelong obesity   . Sleep apnea     Past Surgical History:  Procedure Laterality Date  . ABDOMINAL HYSTERECTOMY    . APPENDECTOMY    . CERVICAL LAMINECTOMY  1987  . CESAREAN SECTION    . COLONOSCOPY  2014   diverticulosis, [facility] Dr. [name]   . SPINE SURGERY    . TUBAL LIGATION      There were no vitals filed for this visit.  Subjective Assessment - 08/16/18 0953    Subjective  Patient reports she was very sore all over including in her fingers following last treatment session and she felt this was from the spinal joint mobilizations. SHe would prefer not to do them today. She states that her hip hurt quite a bit throughout the weekend but not too bad yesterday or today.  Right hip is the one that bothers her.      Pertinent History  Patient is a 73 y.o. female who presents to outpatient physical therapy with a referral for medical diagnosis of chronic right-sided low back pain without sciatica. This patient's chief complaints consist of pain in her right flank and lumbar region as well as hip/glute pain on the right, and reduced activity tolerance leading to the following functional deficits: difficulty with her usual ADLs, IADLs, household and community mobility, and community participation.     Limitations  Other (comment);Sitting   laying on that side feels like she is laying on something   Currently in Pain?  No/denies    Pain Onset  More than a month ago          : 1300 feet.   TREATMENT: Denies latex allergies, was on prednisone for a month (longest she has been on it), denies diagnosed osteoporosis or osteopenia, history of cervical spine lamenectomy - denies any other spinal surgery. Denies history of cancer.  Therapeutic exercise:to centralize symptoms and improve ROM and strength required for successful completion of functional activities. -Walking around clinic as quickly as possible for with 0%grade with SBA, and no UE support for most of it.For improved lower extremity mobility,  muscular endurance, and weightbearing activity tolerance; and to induce the analgesic effect of aerobic exercise, stimulate improved joint nutrition, and prepare body structures and systems for following interventions. X during subjective exam. Produced pain over right superior iliac crest region.  - Repeated lumbar extension in standingx20 (decreased hip pain) x15, throughout session to decrease any lumbar irritation and as a prophylactic.during =decreasing pain; after = abolished in hip.  - prone press up lumbar extension. To improve hip pain and prevent it from returning as easily. Cuing for relaxation of lumbar and glute muscles and to attempted bilateral elbow extension. x10  abolished.  -Standing rows with scapular retraction for improved postural and shoulder girdle strengthening and mobility. Required instruction for technique and cuing to retract, posteriorly tilt, and depress scapulaeTo improve postural strength and back strength and activity tolerance. 2X15 at 20# - seated lat pull with isolated scapular depression/elevation. To improve posterior trunk muscle control and strength and to provide a gentle distraction force thorough the spine. Cuing for form, to isolate scapular motion, and to not compensate by leaning back. x10 at 20#/ - hip abduction at hip machine. Facing out to prevent compensatory hip flexion. Cuing for posture and to lead with heel. To improve lateral hip strength and muscle endurance for single leg stance activities such as walking and stepping.  2x10 each side at 25#. -Standing pallof press (multifidus press).  Cuing for trunk activation and form.To improve activity tolerance and strength for rotational forces during usual daily activities such as pushing or pulling with one hand. 2x10 each side at 10#.  - step up to 6 inch step with SLS balance for 1 second on top of step and touch down UUE support. CGA and gait belt for safety. 2X10 on each side. To improve functional SLS strength and balance. Cuing for balance.  - heel raises on 20 degree slant board, to improve ankle strength and flexibility for improved balance. Cuing to keep hips forward. X 10   HOME EXERCISE PROGRAM Access Code: [email or web address]  URL: [email or web address]  Date: 08/02/2018  Prepared by: Norton Blizzard   Exercises   Standing Lumbar Extension with Counter - 10-15 reps - 1 sets - 1 second hold - 6x daily   Seated Correct Posture   Seated Cervical Retraction - 10-15 reps - 1 second hold - 4-6x daily   Seated Cervical Retraction and Extension - 10-15 reps - 1 second hold - 4-6x daily   Standing Bilateral Low Shoulder Row with Anchored Resistance - 10-15 reps  - 1 second hold - 3 Sets - 1x daily - 7x weekly   Patient response to treatment: Pt tolerated treatment well. Pt was able to complete all exercises with minimal to no lasting increase in pain or discomfort. She had some onset of R hip pain duirng waling  exercise that was abolished with repeated lumbar extension in standing and in prone. Trunk, LE, and functionalstrengthening exercises wereprogressed with good tolerance today.Pt required cuing for proper technique and to facilitate improved neuromuscular control, strength, range of motion, and functional ability.         PT Education - 08/16/18 0956    Education Details   Exercise purpose/form. Self management techniques. Education on diagnosis, prognosis, POC, anatomy and physiology of current condition     Person(s) Educated  Patient    Methods  Explanation;Demonstration;Tactile cues;Verbal cues    Comprehension  Verbalized understanding;Returned demonstration       PT Short Term Goals - 08/04/18 1053  PT SHORT TERM GOAL #1   Title  Be independent with initial home exercise program for self-management of symptoms.    Baseline  initial HEP provided at IE (07/26/2018)    Time  2    Period  Weeks    Status  Achieved    Target Date  08/10/18        PT Long Term Goals - 08/02/18 1102      PT LONG TERM GOAL #1   Title  Be independent with a long-term home exercise program for self-management of symptoms    Baseline  initial HEP provided 07/26/2018    Time  6    Period  Weeks    Status  Achieved      PT LONG TERM GOAL #2   Title  Demonstrate improved FOTO score by 10 units to demonstrate improvement in overall condition and self-reported functional ability.     Baseline  FOTO = 63 (07/26/2018);    Time  6    Period  Weeks    Status  New      PT LONG TERM GOAL #3   Title  Patient will demonstrate BLE strength 5/5 to demonstrate functional strength for unlimited mobility, improved activity tolerance, IADLs, and  social participation.     Baseline  see objective exam    Time  6    Period  Weeks    Status  New      PT LONG TERM GOAL #4   Title  Demonstrate full lumbar AROM with no increase in pain in all planes except intermittent end range discomfort to allow patient to complete valued activities with less difficulty.     Baseline  see objective exam    Time  6    Period  Weeks    Status  New      PT LONG TERM GOAL #5   Title  Complete community, work and/or recreational activities without limitation due to current condition.     Baseline  difficulty with her usual ADLs, IADLs, household and community mobility, and community participation.     Time  6    Period  Weeks    Status  New            Plan - 08/16/18 1037    Clinical Impression Statement   Patient has completed 7 treatment sessions so far this episode of care and is continuing to progress towards goals. She continues to demonstrate good ability to control lumbar and hip symptom with specific exercises for an extension preference and is continuing to work towards improving trunk, LE, and functional strength and activity tolerance to provide more long term reduction of symptoms and improved function. She is showing some difficulty maintaining lasting improvement with hip pain but did not respond well to manual last session, so it was discontinued. Patient is a 73 y.o. female referred to outpatient physical therapy with a medical diagnosis of chronic right-sided low back pain without sciatica who presents with signs and symptoms consistent with low back pain with referral to right LE. Patient demonstrates improving limitations in mobility, strength, and activity tolerance that leads to decreased ability to participate fully in previously enjoyed activities such as watching her grandson. She would benefit from skilled physical therapy to address her impairments and functional limitations to allow her to achieve PLOF or maximal function  possible.    Rehab Potential  Good    Clinical Impairments Affecting Rehab Potential  (+) motivation, prior success with PT (-)  possible visceral source of pain for flank symptoms, obesity, comorbidities    PT Frequency  2x / week    PT Duration  6 weeks    PT Treatment/Interventions  ADLs/Self Care Home Management;Aquatic Therapy;Biofeedback;Electrical Stimulation;Cryotherapy;Moist Heat;Gait training;Stair training;Functional mobility training;Therapeutic activities;Therapeutic exercise;Balance training;Neuromuscular re-education;Patient/family education;Manual techniques;Passive range of motion;Dry needling;Spinal Manipulations;Joint Manipulations;Other (comment)   joint mobilizations grades I-V, pain neuroscience education   PT Next Visit Plan  assess response to HEP and modify as appropriate. Progress LE, trunk, and functional strengthening and mobility exercises as needed. incorporate more balance directed exercises.     PT Home Exercise Plan  Medbridge Access Code: [email or web address]     Consulted and Agree with Plan of Care  Patient       Patient will benefit from skilled therapeutic intervention in order to improve the following deficits and impairments:  Abnormal gait, Decreased activity tolerance, Decreased endurance, Decreased strength, Decreased range of motion, Impaired perceived functional ability, Hypomobility, Pain, Obesity, Postural dysfunction, Impaired flexibility, Decreased balance, Decreased mobility  Visit Diagnosis: Chronic right-sided low back pain without sciatica  Pain in right hip  Muscle weakness (generalized)     Problem List Patient Active Problem List   Diagnosis Date Noted  . Advanced care planning/counseling discussion 12/17/2016  . Skin lesions, generalized 11/13/2016  . Chronic right hip pain 06/17/2016  . Vitamin D deficiency 09/26/2015  . Diastasis recti 08/08/2015  . Elevated serum GGT level 07/24/2015  . Essential hypertension 07/17/2015  . Fatty liver  07/17/2015  . Elevated alkaline phosphatase level 07/17/2015  . Elevated serum glutamic pyruvic transaminase (SGPT) level 07/17/2015  . Abnormal finding on EKG 07/11/2015  . Morbid obesity (HCC) 07/11/2015  . Colon, diverticulosis 07/11/2015  . Abdominal wall hernia 07/11/2015  . DM (diabetes mellitus), type 2 with neurological complications (HCC)   . Hyperlipidemia     Cira Rue, PT, DPT 08/16/2018, 10:40 AM  [facility] PHYSICAL AND SPORTS MEDICINE [address], [place], [address] Phone: [phone]   Fax:  [phone]  Name: Yolinda Duerr MRN: [id number] Date of Birth: [birth year]

## 2018-08-18 ENCOUNTER — Ambulatory Visit: Payer: Medicare HMO | Admitting: Physical Therapy

## 2018-08-18 ENCOUNTER — Encounter: Payer: Self-pay | Admitting: Physical Therapy

## 2018-08-18 DIAGNOSIS — M545 Low back pain: Secondary | ICD-10-CM | POA: Diagnosis not present

## 2018-08-18 DIAGNOSIS — M25551 Pain in right hip: Secondary | ICD-10-CM

## 2018-08-18 DIAGNOSIS — M6281 Muscle weakness (generalized): Secondary | ICD-10-CM

## 2018-08-18 DIAGNOSIS — Z1231 Encounter for screening mammogram for malignant neoplasm of breast: Secondary | ICD-10-CM | POA: Diagnosis not present

## 2018-08-18 DIAGNOSIS — G8929 Other chronic pain: Secondary | ICD-10-CM

## 2018-08-18 LAB — HM MAMMOGRAPHY

## 2018-08-18 NOTE — Therapy (Signed)
[facility] [address], [place], [address] Phone: [phone]   Fax:  [phone]  Physical Therapy Treatment  Patient Details  Name: Alyssa Mayo MRN: [id number] Date of Birth: 1945/08/14 Referring Provider (PT): Dorcas Carrow, [place]    Encounter Date: 08/18/2018  PT End of Session - 08/18/18 0957    Visit Number  8    Number of Visits  12    Date for PT Re-Evaluation  09/07/18    Authorization Type  [employer] HMO/PPO reporting period from 07/26/2018    Authorization Time Period  Current Cert period: 07/26/2018 - 09/07/2018 (last PN: [facility] 07/26/2018)    Authorization - Visit Number  8    Authorization - Number of Visits  10    PT Start Time  0950    PT Stop Time  1030    PT Time Calculation (min)  40 min    Equipment Utilized During Treatment  Gait belt    Activity Tolerance  Patient tolerated treatment well;Patient limited by fatigue    Behavior During Therapy  [facility] for tasks assessed/performed       Past Medical History:  Diagnosis Date  . Arthritis   . Diabetes mellitus without complication (HCC)   . Elevated liver enzymes   . Fatty liver   . Hyperlipidemia   . Lifelong obesity   . Sleep apnea     Past Surgical History:  Procedure Laterality Date  . ABDOMINAL HYSTERECTOMY    . APPENDECTOMY    . CERVICAL LAMINECTOMY  1987  . CESAREAN SECTION    . COLONOSCOPY  2014   diverticulosis, [facility] Dr. [name]   . SPINE SURGERY    . TUBAL LIGATION      There were no vitals filed for this visit.  Subjective Assessment - 08/18/18 0954    Subjective  Patient reports she has no pain when standing but her pain was near 10/10 when she was sitting this morning on the coutch and when driving.  She reports it is not preventing her from sleeping. she was mildly sore following her last treatment session.     Pertinent History  Patient is a 73 y.o. female who presents to outpatient physical therapy with a  referral for medical diagnosis of chronic right-sided low back pain without sciatica. This patient's chief complaints consist of pain in her right flank and lumbar region as well as hip/glute pain on the right, and reduced activity tolerance leading to the following functional deficits: difficulty with her usual ADLs, IADLs, household and community mobility, and community participation.     Limitations  Other (comment);Sitting   laying on that side feels like she is laying on something   Currently in Pain?  No/denies    Pain Score  9    this morning while sitting   Pain Location  Hip    Pain Orientation  Right    Pain Descriptors / Indicators  Sharp    Pain Type  Chronic pain    Pain Radiating Towards  posterior hip    Pain Onset  More than a month ago       OBJECTIVE:   : 1300 feet.  R FABER = pain in low back (indicitive of low back pain) R hip long axis distraction = decreased hp pain, feels good R SLR = positive neurodynamic test with positive sensitizing maneuver (indicative of sciatic neural tension) but pt states "not as bad as hip pain"  and points to posterior thigh as location of pain.  Repeated extension in standing = decreased hip pain  TREATMENT: Denies latex allergies, was on prednisone for a month (longest she has been on it), denies diagnosed osteoporosis or osteopenia, history of cervical spine lamenectomy - denies any other spinal surgery. Denies history of cancer.  Therapeutic exercise:to centralize symptoms and improve ROM and strength required for successful completion of functional activities. -Walking around clinic as quickly as possible for with 0%grade with SBA, and no UE support for most of it.For improved lower extremity mobility, muscular endurance, and weightbearing activity tolerance; and to induce the analgesic effect of aerobic exercise, stimulate improved joint nutrition, and prepare body structures and systems for following interventions. X  during subjective exam. Produced pain over right superior iliac crest region.   - Repeated lumbar extension in standing2x15 (decreased hip pain), throughout session to decrease any lumbar irritation and as a prophylactic.during =decreasing pain; after = abolished in hip.  - prone press up lumbar extension. To improve hip pain and prevent it from returning as easily. Cuing for relaxation of lumbar and glute muscles and to attempted bilateral elbow extension.x10 abolished.   Manual therapy: to reduce pain and tissue tension, improve range of motion, neuromodulation, in order to promote improved ability to complete functional activities. - prone lying CPA and right UPA right lumbar segments. Grade II-IV to improve motion and decrease hip pain.  - STM to right glute, piriformis, quadratus femoris, lumbar paraspinals, with and without instrument assist to decrease muscular tension in the posterior hip region and along the sciatic nerve.    HOME EXERCISE PROGRAM Access Code: [email or web address]  URL: [email or web address]  Date: 08/02/2018  Prepared by: Norton Blizzard   Exercises   Standing Lumbar Extension with Counter - 10-15 reps - 1 sets - 1 second hold - 6x daily   Seated Correct Posture   Seated Cervical Retraction - 10-15 reps - 1 second hold - 4-6x daily   Seated Cervical Retraction and Extension - 10-15 reps - 1 second hold - 4-6x daily   Standing Bilateral Low Shoulder Row with Anchored Resistance - 10-15 reps - 1 second hold - 3 Sets - 1x daily - 7x weekly   Patient response to treatment: Pt tolerated treatment well. Pt was able to complete all exercises with minimal to no lasting increase in pain or discomfort.She had increased hip pain with ambulation that was abolished with repeated extension in standing. She had increased muscle tension in right glutes that dissipated with manual and point pain with right UPA at L5 that improved with repeated UPA and CPA to  that segment. Following manual she was able to perform significantly greater lumbar extension in standing and was re-educated about how to continue this exercise to get to end range at home. She was unable to push hard enough with arms to get a better lumbar stretch in prone compared to standing. Trunk, LE, and functionalstrengthening exercises wereprogressedwith good tolerance today.Pt required cuing for proper technique and to facilitate improved neuromuscular control, strength, range of motion, and functional ability.        PT Education - 08/18/18 0956    Education Details  Exercise purpose/form. Self management techniques. Education on diagnosis, prognosis, POC, anatomy and physiology of current condition     Person(s) Educated  Patient    Methods  Explanation;Demonstration;Tactile cues;Verbal cues    Comprehension  Verbalized understanding;Returned demonstration       PT Short Term Goals - 08/04/18 1053  PT SHORT TERM GOAL #1   Title  Be independent with initial home exercise program for self-management of symptoms.    Baseline  initial HEP provided at [facility] (07/26/2018)    Time  2    Period  Weeks    Status  Achieved    Target Date  08/10/18        PT Long Term Goals - 08/02/18 1102      PT LONG TERM GOAL #1   Title  Be independent with a long-term home exercise program for self-management of symptoms    Baseline  initial HEP provided 07/26/2018    Time  6    Period  Weeks    Status  Achieved      PT LONG TERM GOAL #2   Title  Demonstrate improved FOTO score by 10 units to demonstrate improvement in overall condition and self-reported functional ability.     Baseline  FOTO = 63 (07/26/2018);    Time  6    Period  Weeks    Status  New      PT LONG TERM GOAL #3   Title  Patient will demonstrate BLE strength 5/5 to demonstrate functional strength for unlimited mobility, improved activity tolerance, IADLs, and social participation.     Baseline  see objective exam     Time  6    Period  Weeks    Status  New      PT LONG TERM GOAL #4   Title  Demonstrate full lumbar AROM with no increase in pain in all planes except intermittent end range discomfort to allow patient to complete valued activities with less difficulty.     Baseline  see objective exam    Time  6    Period  Weeks    Status  New      PT LONG TERM GOAL #5   Title  Complete community, work and/or recreational activities without limitation due to current condition.     Baseline  difficulty with her usual ADLs, IADLs, household and community mobility, and community participation.     Time  6    Period  Weeks    Status  New            Plan - 08/18/18 1333    Clinical Impression Statement  Patient has completed 8 treatment sessions so far this episode of care and is continuing to progress overall towards goals. She continues to demonstrate good ability to control lumbar and hip symptom with specific exercises for an extension preference but is unable to maintain her hip pain free between sessions. Progressed interventions for extension preference were performed today with improved motion and pain. She was also re-assessed for hip hip pain and continued to demonstrate signs most related to lumbar vs hip source of symptoms. She will benefit from continuing to work towards improving trunk, LE, and functional strength and activity tolerance to provide more long term reduction of symptoms and improved function. . Patient is a 73 y.o. female referred to outpatient physical therapy with a medical diagnosis of chronic right-sided low back pain without sciatica who presents with signs and symptoms consistent with low back pain with referral to right LE. Patient demonstrates improving limitations in mobility, strength, and activity tolerance that leads to decreased ability to participate fully in previously enjoyed activities such as watching her grandson. She would benefit from skilled physical therapy  to address her impairments and functional limitations to allow her to achieve PLOF or  maximal function possible.    Rehab Potential  Good    Clinical Impairments Affecting Rehab Potential  (+) motivation, prior success with PT (-) possible visceral source of pain for flank symptoms, obesity, comorbidities    PT Frequency  2x / week    PT Duration  6 weeks    PT Treatment/Interventions  ADLs/Self Care Home Management;Aquatic Therapy;Biofeedback;Electrical Stimulation;Cryotherapy;Moist Heat;Gait training;Stair training;Functional mobility training;Therapeutic activities;Therapeutic exercise;Balance training;Neuromuscular re-education;Patient/family education;Manual techniques;Passive range of motion;Dry needling;Spinal Manipulations;Joint Manipulations;Other (comment)   joint mobilizations grades I-V, pain neuroscience education   PT Next Visit Plan  assess response to HEP and modify as appropriate. Progress LE, trunk, and functional strengthening and mobility exercises as needed. incorporate more balance directed exercises.     PT Home Exercise Plan  Medbridge Access Code: [email or web address]     Consulted and Agree with Plan of Care  Patient       Patient will benefit from skilled therapeutic intervention in order to improve the following deficits and impairments:  Abnormal gait, Decreased activity tolerance, Decreased endurance, Decreased strength, Decreased range of motion, Impaired perceived functional ability, Hypomobility, Pain, Obesity, Postural dysfunction, Impaired flexibility, Decreased balance, Decreased mobility  Visit Diagnosis: Chronic right-sided low back pain without sciatica  Pain in right hip  Muscle weakness (generalized)     Problem List Patient Active Problem List   Diagnosis Date Noted  . Advanced care planning/counseling discussion 12/17/2016  . Skin lesions, generalized 11/13/2016  . Chronic right hip pain 06/17/2016  . Vitamin D deficiency 09/26/2015  . Diastasis recti  08/08/2015  . Elevated serum GGT level 07/24/2015  . Essential hypertension 07/17/2015  . Fatty liver 07/17/2015  . Elevated alkaline phosphatase level 07/17/2015  . Elevated serum glutamic pyruvic transaminase (SGPT) level 07/17/2015  . Abnormal finding on EKG 07/11/2015  . Morbid obesity (HCC) 07/11/2015  . Colon, diverticulosis 07/11/2015  . Abdominal wall hernia 07/11/2015  . DM (diabetes mellitus), type 2 with neurological complications (HCC)   . Hyperlipidemia     Cira RueSara R Snyder, PT, DPT 08/18/2018, 1:34 PM  [facility] [address] [place], [place], [address] Phone: [phone]   Fax:  [phone]  Name: Alyssa Mayo MRN: [id number] Date of Birth: 05/29/1946

## 2018-08-23 ENCOUNTER — Ambulatory Visit: Payer: Medicare HMO | Admitting: Physical Therapy

## 2018-08-24 DIAGNOSIS — G4733 Obstructive sleep apnea (adult) (pediatric): Secondary | ICD-10-CM | POA: Diagnosis not present

## 2018-08-25 ENCOUNTER — Ambulatory Visit: Payer: Medicare HMO | Attending: Family Medicine | Admitting: Physical Therapy

## 2018-08-25 ENCOUNTER — Encounter: Payer: Self-pay | Admitting: Physical Therapy

## 2018-08-25 DIAGNOSIS — G8929 Other chronic pain: Secondary | ICD-10-CM | POA: Diagnosis not present

## 2018-08-25 DIAGNOSIS — M545 Low back pain: Secondary | ICD-10-CM | POA: Diagnosis not present

## 2018-08-25 DIAGNOSIS — M6281 Muscle weakness (generalized): Secondary | ICD-10-CM | POA: Diagnosis not present

## 2018-08-25 DIAGNOSIS — M25551 Pain in right hip: Secondary | ICD-10-CM | POA: Insufficient documentation

## 2018-08-25 NOTE — Therapy (Signed)
[facility] PH[facility] AND [facility] MEDICINE [address] [place], [place], [address] Phone: [phone]   Fax:  [phone]  Physical Therapy Treatment  Patient Details  Name: Alyssa Mayo MRN: [id number] Date of Birth: 08/22/1945 Referring Provider (PT): Dorcas CarrowJohnson, Megan P, [place]    Encounter Date: 08/25/2018  PT End of Session - 08/26/18 0953    Visit Number  9    Number of Visits  12    Date for PT Re-Evaluation  09/07/18    Authorization Type  [employer] HMO/PPO reporting period from 07/26/2018    Authorization Time Period  Current Cert period: 07/26/2018 - 09/07/2018 (last PN: [facility] 07/26/2018)    Authorization - Visit Number  9    Authorization - Number of Visits  10    PT Start Time  0950    PT Stop Time  1030    PT Time Calculation (min)  40 min    Equipment Utilized During Treatment  --    Activity Tolerance  Patient tolerated treatment well;Patient limited by fatigue    Behavior During Therapy  [facility] for tasks assessed/performed       Past Medical History:  Diagnosis Date  . Arthritis   . Diabetes mellitus without complication (HCC)   . Elevated liver enzymes   . Fatty liver   . Hyperlipidemia   . Lifelong obesity   . Sleep apnea     Past Surgical History:  Procedure Laterality Date  . ABDOMINAL HYSTERECTOMY    . APPENDECTOMY    . CERVICAL LAMINECTOMY  1987  . CESAREAN SECTION    . COLONOSCOPY  2014   diverticulosis, [facility] Dr. Ricki RodriguezSkulski   . SPINE SURGERY    . TUBAL LIGATION      There were no vitals filed for this visit.  Subjective Assessment - 08/25/18 0956    Subjective  Patient reports she had body aches and felt really bad on Monday but she went to bed and has seemed to fully recover since then. She never had a fever or vomiting. She reports her right hip has been hurting when she sits and her back was a bit ore following last treatment session.  She did not do her HEP sunday or monday due to not feeling well but she did do  some yesterday. State she continues to feel confued about what makes her pain better or worse.  She reports no pain upon arrival but states she continues to have right posterior hip pain intermittantly, and mentions she has it a lot when sitting.     Pertinent History  Patient is a 73 y.o. female who presents to outpatient physical therapy with a referral for medical diagnosis of chronic right-sided low back pain without sciatica. This patient's chief complaints consist of pain in her right flank and lumbar region as well as hip/glute pain on the right, and reduced activity tolerance leading to the following functional deficits: difficulty with her usual ADLs, IADLs, household and community mobility, and community participation.     Limitations  Other (comment);Sitting   laying on that side feels like she is laying on something   Currently in Pain?  No/denies    Pain Onset  More than a month ago      OBJECTIVE:  6MWT: 1250 feet (last measured 08/25/2018). R FABER = pain in low back (indicitive of low back pain) R hip long axis distraction = decreased hp pain, feels good R SLR = positive neurodynamic test with  positive sensitizing maneuver (indicative of sciatic neural tension) but pt states "not as bad as hip pain" and points to posterior thigh as location of pain.  Repeated extension in standing = decreased hip pain  TREATMENT: Denies latex allergies, was on prednisone for a month (longest she has been on it), denies diagnosed osteoporosis or osteopenia, history of cervical spine lamenectomy - denies any other spinal surgery. Denies history of cancer.  Therapeutic exercise:to centralize symptoms and improve ROM and strength required for successful completion of functional activities. -Walking around clinic as quickly as possible for SBA.For improved lower extremity mobility, muscular endurance, and weightbearing activity tolerance; and to induce the analgesic effect of aerobic  exercise, stimulate improved joint nutrition, and prepare body structures and systems for following interventions. X 5 Minutes during subjective exam.   - standing side glide at wall (elbow wedged between ribs and wall) with left hip gliding towards wall to address any relevant lateral component that may be preventing long term improvement with purely sagittal extension. Required extensive cuing to perform correctly. Provided manual overpressure. Produce lumbar to right hip pain at end range but no worse after and decreasing with repetitions. Performed approximately x20 after getting technique correct. - standing side glide with lumbar extension at wall (elbow wedged between ribs and wall with body rotated 45 degrees to the right to achieve lateral and extension force) with left hip gliding towards wall to address any relevant lateral component along with sagittal plane that may be preventing long term improvement with purely sagittal extension. Required extensive cuing to perform correctly. Provided manual overpressure. Produce lumbar to right hip pain at end range but no worse after and decreasing with repetitions. Performed approximately x15after getting technique correct. - Repeated lumbar extension in standing2x15, throughout session to decrease any lumbar irritation and as a prophylactic. - prone press up lumbar extension.To improve hip pain and prevent it from returning as easily. Cuing for relaxation of lumbar and glute muscles and to attempted bilateral elbow extension.2x10 with second set clinician overpressure.   Manual therapy: to reduce pain and tissue tension, improve range of motion, neuromodulation, in order to promote improved ability to complete functional activities. - prone lying CPA and right UPA right lumbar segments. Grade II-IV to improve motion and decrease hip pain, prevent continued hip pain.   - STM to right glute, piriformis, quadratus femoris, lumbar paraspinals, with  and without instrument assist to decrease muscular tension in the posterior hip region and along the sciatic nerve.    HOME EXERCISE PROGRAM Access Code: [email or web address]  URL: [email or web address]  Date: 08/02/2018  Prepared by: Norton Blizzard   Exercises   Standing Lumbar Extension with Counter - 10-15 reps - 1 sets - 1 second hold - 6x daily   Seated Correct Posture   Seated Cervical Retraction - 10-15 reps - 1 second hold - 4-6x daily   Seated Cervical Retraction and Extension - 10-15 reps - 1 second hold - 4-6x daily   Standing Bilateral Low Shoulder Row with Anchored Resistance - 10-15 reps - 1 second hold - 3 Sets - 1x daily - 7x weekly   Patient response to treatment: Pt tolerated treatment well. Pt was able to complete all exercises with no lasting increase in pain. She found manual uncomfortable at the time but continued to be pain free and demonstrated greater lumbar ROM following. Exercises were added to address possible relevant lateral component since she is continuing to have symptoms with purely sagittal motion, despite continuing to abolish  symptoms in clinic with sagittal motion. She had some reproduction of hip pain with side-gliding that may indicate this lateral motion is necessary for improvement. She continues with increased muscle tension in right glutes that dissipated with manual. Lateral exercise added to HEP today. Pt required cuing for proper technique and to facilitate improved neuromuscular control, strength, range of motion, and functional ability.      PT Education - 08/26/18 0943    Education Details  Exercise purpose/form. Self management techniques. Education on diagnosis, prognosis, POC, anatomy and physiology of current condition . Updated HEP and provided education and handout.     Person(s) Educated  Patient    Methods  Explanation;Demonstration;Tactile cues;Verbal cues    Comprehension  Verbalized understanding;Returned  demonstration       PT Short Term Goals - 08/04/18 1053      PT SHORT TERM GOAL #1   Title  Be independent with initial home exercise program for self-management of symptoms.    Baseline  initial HEP provided at IE (07/26/2018)    Time  2    Period  Weeks    Status  Achieved    Target Date  08/10/18        PT Long Term Goals - 08/02/18 1102      PT LONG TERM GOAL #1   Title  Be independent with a long-term home exercise program for self-management of symptoms    Baseline  initial HEP provided 07/26/2018    Time  6    Period  Weeks    Status  Achieved      PT LONG TERM GOAL #2   Title  Demonstrate improved FOTO score by 10 units to demonstrate improvement in overall condition and self-reported functional ability.     Baseline  FOTO = 63 (07/26/2018);    Time  6    Period  Weeks    Status  New      PT LONG TERM GOAL #3   Title  Patient will demonstrate BLE strength 5/5 to demonstrate functional strength for unlimited mobility, improved activity tolerance, IADLs, and social participation.     Baseline  see objective exam    Time  6    Period  Weeks    Status  New      PT LONG TERM GOAL #4   Title  Demonstrate full lumbar AROM with no increase in pain in all planes except intermittent end range discomfort to allow patient to complete valued activities with less difficulty.     Baseline  see objective exam    Time  6    Period  Weeks    Status  New      PT LONG TERM GOAL #5   Title  Complete community, work and/or recreational activities without limitation due to current condition.     Baseline  difficulty with her usual ADLs, IADLs, household and community mobility, and community participation.     Time  6    Period  Weeks    Status  New            Plan - 08/26/18 1015    Clinical Impression Statement  Patient has completed 9 treatment sessions so far this episode of care and has progressed overall towards goals since initial eval. She is starting to show a pattern  of improvement in clinic without significant carry over between treatment sessions, so lateral exercises were added as a trial to see if this will make her improvement more  lasting.. She continues to demonstrate good ability to control lumbar and hip symptom with specific exercises for an extension preference but is unable to maintain her hip pain free between sessions. She will benefit from continuing to work towards improving trunk, LE, and functional strength and activity tolerance to provide more long term reduction of symptoms and improved function. . Patient is a 74 y.o. female referred to outpatient physical therapy with a medical diagnosis of chronic right-sided low back pain without sciatica who presents with signs and symptoms consistent with low back pain with referral to right LE. Patient demonstrates improving limitations in mobility, strength, and activity tolerance that leads to decreased ability to participate fully in previously enjoyed activities such as watching her grandson. She would benefit from skilled physical therapy to address her impairments and functional limitations to allow her to achieve PLOF or maximal function possible.    Rehab Potential  Good    Clinical Impairments Affecting Rehab Potential  (+) motivation, prior success with PT (-) possible visceral source of pain for flank symptoms, obesity, comorbidities    PT Frequency  2x / week    PT Duration  6 weeks    PT Treatment/Interventions  ADLs/Self Care Home Management;Aquatic Therapy;Biofeedback;Electrical Stimulation;Cryotherapy;Moist Heat;Gait training;Stair training;Functional mobility training;Therapeutic activities;Therapeutic exercise;Balance training;Neuromuscular re-education;Patient/family education;Manual techniques;Passive range of motion;Dry needling;Spinal Manipulations;Joint Manipulations;Other (comment)   joint mobilizations grades I-V, pain neuroscience education   PT Next Visit Plan  assess response to HEP  and modify as appropriate. Progress LE, trunk, and functional strengthening and mobility exercises as needed. incorporate more balance directed exercises.  Continue lateral specific exercises and manual as appropriate    PT Home Exercise Plan  Medbridge Access Code: [email or web address]     Consulted and Agree with Plan of Care  Patient       Patient will benefit from skilled therapeutic intervention in order to improve the following deficits and impairments:  Abnormal gait, Decreased activity tolerance, Decreased endurance, Decreased strength, Decreased range of motion, Impaired perceived functional ability, Hypomobility, Pain, Obesity, Postural dysfunction, Impaired flexibility, Decreased balance, Decreased mobility  Visit Diagnosis: Chronic right-sided low back pain without sciatica  Pain in right hip  Muscle weakness (generalized)     Problem List Patient Active Problem List   Diagnosis Date Noted  . Advanced care planning/counseling discussion 12/17/2016  . Skin lesions, generalized 11/13/2016  . Chronic right hip pain 06/17/2016  . Vitamin D deficiency 09/26/2015  . Diastasis recti 08/08/2015  . Elevated serum GGT level 07/24/2015  . Essential hypertension 07/17/2015  . Fatty liver 07/17/2015  . Elevated alkaline phosphatase level 07/17/2015  . Elevated serum glutamic pyruvic transaminase (SGPT) level 07/17/2015  . Abnormal finding on EKG 07/11/2015  . Morbid obesity (HCC) 07/11/2015  . Colon, diverticulosis 07/11/2015  . Abdominal wall hernia 07/11/2015  . DM (diabetes mellitus), type 2 with neurological complications (HCC)   . Hyperlipidemia     Cira Rue, PT, DPT 08/26/2018, 10:16 AM  [facility] PHYSICAL AND SPORTS MEDICINE [address], [place], [address] Phone: [phone]   Fax:  [phone]  Name: Kian Calistro MRN: [id number] Date of Birth: 1946-07-06

## 2018-08-26 ENCOUNTER — Telehealth: Payer: Self-pay | Admitting: Physical Therapy

## 2018-08-26 NOTE — Telephone Encounter (Signed)
Called patient due to realizing she needs progress note next visit and was seeing another therapist for just that visit. Discussed and canceled that appt so that she can see regular therapist for that visit.

## 2018-08-30 ENCOUNTER — Ambulatory Visit: Payer: Medicare HMO | Admitting: Physical Therapy

## 2018-08-30 DIAGNOSIS — R69 Illness, unspecified: Secondary | ICD-10-CM | POA: Diagnosis not present

## 2018-08-30 DIAGNOSIS — H353211 Exudative age-related macular degeneration, right eye, with active choroidal neovascularization: Secondary | ICD-10-CM | POA: Diagnosis not present

## 2018-09-01 ENCOUNTER — Ambulatory Visit: Payer: Medicare HMO | Admitting: Physical Therapy

## 2018-09-01 DIAGNOSIS — M25551 Pain in right hip: Secondary | ICD-10-CM | POA: Diagnosis not present

## 2018-09-01 DIAGNOSIS — G8929 Other chronic pain: Secondary | ICD-10-CM | POA: Diagnosis not present

## 2018-09-01 DIAGNOSIS — M6281 Muscle weakness (generalized): Secondary | ICD-10-CM | POA: Diagnosis not present

## 2018-09-01 DIAGNOSIS — M545 Low back pain: Secondary | ICD-10-CM | POA: Diagnosis not present

## 2018-09-01 NOTE — Therapy (Signed)
[facility] [address], [place], [address] Phone: [phone]   Fax:  [phone]  Physical Therapy Treatment and Discharge Summary Reporting period: 07/26/2018 - 09/01/2018  Patient Details  Name: Alyssa Mayo MRN: [id number] Date of Birth: 09-24-1945 Referring Provider (PT): Valerie Roys, [place]    Encounter Date: 09/01/2018  PT End of Session - 09/01/18 1051    Visit Number  10    Number of Visits  12    Date for PT Re-Evaluation  09/07/18    Authorization Type  [employer] Medicare HMO/PPO reporting period from 07/26/2018    Authorization Time Period  Current Cert period: 03/26/7892 - 09/07/2018 (last PN: 09/01/2018)    Authorization - Visit Number  10    Authorization - Number of Visits  10    PT Start Time  [phone]    PT Stop Time  1040    PT Time Calculation (min)  47 min    Activity Tolerance  Patient tolerated treatment well    Behavior During Therapy  [facility] for tasks assessed/performed       Past Medical History:  Diagnosis Date  . Arthritis   . Diabetes mellitus without complication ([place])   . Elevated liver enzymes   . Fatty liver   . Hyperlipidemia   . Lifelong obesity   . Sleep apnea     Past Surgical History:  Procedure Laterality Date  . ABDOMINAL HYSTERECTOMY    . APPENDECTOMY    . CERVICAL LAMINECTOMY  1987  . CESAREAN SECTION    . COLONOSCOPY  2014   diverticulosis, [facility] Dr. Donnella Sham   . SPINE SURGERY    . TUBAL LIGATION      There were no vitals filed for this visit.  Subjective Assessment - 09/01/18 1001    Subjective  Patient reports she has no pain upon arrival but continues to have intermittant right hip pain that is worse when she is riding in the car. She states it feels like it is worse the more active she is. She also reports mild right abdominal discomfort that she feels is related to not yet having a BM today. She reports that overall her hip pain is possibly decreased in  intensity and possibly frequency. She has no change in her FOTO score from baseline at 63 points. She reports she has tried some of her HEP but has difficulty doing the side glide against the wall.     Pertinent History  Patient is a 73 y.o. female who presents to outpatient physical therapy with a referral for medical diagnosis of chronic right-sided low back pain without sciatica. This patient's chief complaints consist of pain in her right flank and lumbar region as well as hip/glute pain on the right, and reduced activity tolerance leading to the following functional deficits: difficulty with her usual ADLs, IADLs, household and community mobility, and community participation.     Limitations  Other (comment);Sitting   laying on that side feels like she is laying on something   Currently in Pain?  No/denies    Pain Onset  More than a month ago         [facility] PT Assessment - 09/01/18 0001      Assessment   Medical Diagnosis  Chronic right-sided low back pain without sciatica    Referring Provider (PT)  Valerie Roys, DO     Next MD Visit  march or april 2020    Prior  Therapy  She has had prior PT for the right hip pain, that helped.       Precautions   Precautions  None      Restrictions   Weight Bearing Restrictions  No      Home Environment   Living Environment  Private residence    Living Arrangements  Alone      Prior Function   Level of Independence  Independent    Vocation  Retired    Biomedical scientist  retired Education officer, museum    Leisure  keep her grandson, go to the park, kids meuseum      Cognition   Overall Cognitive Status  Within Functional Limits for tasks assessed      Observation/Other Assessments   Observations  For latest objective measures see note dated 09/01/2018    Focus on Therapeutic Outcomes ([place])   63        OBJECTIVE:  OBSERVATION/INSPECTION: Patient presents with normal lumbar lordosis, obesity, forward head, rounded and elevated  shoulders.   SPINE MOTION Lumbar AROM:  *Indicates pain  Flexion: = 100.  Extension: = 75%   Rotation: R = WNL,  L = motion WFL  Side Flexion: R = R hip, 50%, L = 50% R hip pain  PERIPHERAL JOINT MOTION (AROM/PROM in degrees):  *Indicates pain Hip         Bilateral hip grossly WFL. Knee  Bilateral knees grossly WFL.  STRENGTH:  *Indicates pain Hip         Flexion: R = 5/5, L = 5/5.  Extension: R = 4+/5, L = 5/5.  Abduction: R = 4+/5, L = 5/5. Knee  Ext: R = 5/5, L = 5/5.  Flex: R = 5/5, L = 5/5.  REPEATED MOTIONS TESTING: - Repeated lumbar extension in standing x 20 = abolished in hip. - Repeated lumbar flexion in standing x 10, end range stretch in hamstrings no worse following. No loss of lumbar extension or flexion or change in rotation or side bending following.   FUNCTIONAL TESTING:  6MWT: 1150 feet (last measured 09/01/2018).  Objective measurements completed on examination: See above findings.   TREATMENT: Denies latex allergies, was on prednisone for a month (longest she has been on it), denies diagnosed osteoporosis or osteopenia, history of cervical spine lamenectomy - denies any other spinal surgery. Denies history of cancer.  Therapeutic exercise:to centralize symptoms and improve ROM and strength required for successful completion of functional activities. -Walking around clinic as quickly as possible for 6MWTindependently.For improved lower extremity mobility, muscular endurance, and weightbearing activity tolerance; and to induce the analgesic effect of aerobic exercise, stimulate improved joint nutrition, and prepare body structures and systems for following interventions. Produced right hip pain. - standing side glide at wall (elbow wedged between ribs and wall) with left hip gliding towards wall to address any relevant lateral component that may be preventing long term improvement with purely sagittal extension. Required extensive  cuing to perform correctly. Provided manual overpressure. Produce lumbar to right hip pain at end range but no worse after and decreasing with repetitions. Performed approximately x10 after getting technique correct. - Repeated lumbar extension in standingx20, x15, throughout session to decrease any lumbar irritation and as a prophylactic. - examination to assess progress (see above).  - Standing rows with scapular retraction for improved postural and shoulder girdle strengthening and mobility. Required instruction for technique and cuing to retract, posteriorly tilt, and depress scapulae. x10 with black theraband.  - Standing pallof press (multifidus  press) with black theraband x 10 each side. Cuing for trunk stability.  - standing hip abduction against red theraloop with practice donning and doffing independently. x10 each side with BUE support - Review of HEP with handout and black and red therabands. - Squats with BUE support focusing on glute activation and proper form with hip hinging, stabilized back, and tibial perpendicular to floor with knees behind toes. X 10.     HOME EXERCISE PROGRAM Access Code: [email or web address]  URL: [email or web address]  Date: 09/01/2018  Prepared by: Rosita Kea   Exercises  Standing Lumbar Extension with Counter - 10-15 reps - 1 sets - 1 second hold - 6x daily  Pain on Right-Standing Repeated Side Glide - 10-15 reps - 1 second hold - 6x daily  Seated Correct Posture  Seated Cervical Retraction - 10-15 reps - 1 second hold - 6x daily  Seated Cervical Retraction and Extension - 10-15 reps - 1 second hold - 6x daily  Standing Bilateral Low Shoulder Row with Anchored Resistance - 10-15 reps - 1 second hold - 3 Sets - 1x daily - 3x weekly  Standing Anti-Rotation Press with Anchored Resistance - 10-15 reps - 1 second hold - 3 Sets - 1x daily - 3x weekly  Forward Step Up - 10-15 reps - 1 second hold - 3 Sets - 1x daily - 3x weekly  Heel Raise - 10-15  reps - 1 second hold - 3 Sets - 1x daily - 3x weekly  Standing Hip Abduction with Resistance at Ankles and Counter Support - 10-15 reps - 1 second hold - 3 Sets - 1x daily - 3x weekly  Squat with Counter Support - 10-15 reps - 1 second hold - 3 Sets - 1x daily - 3x weekly   Patient response to treatment: Pt tolerated treatment well. Pt was able to complete all exercises with no lasting increase in pain. She was able to demonstrate good ability to complete exercises from long term HEP independently with minimal cuing.  She does not appear to have benefited greatly from addition of lateral repeated exercise but states she likes to do it, so it was also reviewed today. Pt reports she feels comfortable with discharge today and plans to return to working out in her home gym. Pt required cuing for proper technique and to facilitate improved neuromuscular control, strength, range of motion, and functional ability.    PT Education - 09/01/18 1050    Education Details  Exercise purpose/form. Self management techniques. Education on diagnosis, prognosis, POC, anatomy and physiology of current condition . Updated HEP and provided education and handout.     Person(s) Educated  Patient    Methods  Explanation;Demonstration;Tactile cues;Verbal cues;Handout    Comprehension  Verbalized understanding;Returned demonstration       PT Short Term Goals - 08/04/18 1053      PT SHORT TERM GOAL #1   Title  Be independent with initial home exercise program for self-management of symptoms.    Baseline  initial HEP provided at IE (07/26/2018)    Time  2    Period  Weeks    Status  Achieved    Target Date  08/10/18        PT Long Term Goals - 09/01/18 1005      PT LONG TERM GOAL #1   Title  Be independent with a long-term home exercise program for self-management of symptoms    Baseline  initial HEP provided 07/26/2018; patient performs some exercises but not at  reccomended rate. Has been provided with long  term program at discharge (09/01/2018);     Time  6    Period  Weeks    Status  Partially Met    Target Date  09/07/18      PT LONG TERM GOAL #2   Title  Demonstrate improved FOTO score by 10 units to demonstrate improvement in overall condition and self-reported functional ability.     Baseline  FOTO = 63 (07/26/2018); FOTO = 63 (09/01/2018);     Time  6    Period  Weeks    Status  Not Met    Target Date  09/07/18      PT LONG TERM GOAL #3   Title  Patient will demonstrate BLE strength 5/5 to demonstrate functional strength for unlimited mobility, improved activity tolerance, IADLs, and social participation.     Baseline  nearly met (see objective exam).     Time  6    Period  Weeks    Status  Partially Met    Target Date  09/07/18      PT LONG TERM GOAL #4   Title  Demonstrate full lumbar AROM with no increase in pain in all planes except intermittent end range discomfort to allow patient to complete valued activities with less difficulty.     Baseline   improved motion and decreased pain (see objective exam)    Time  6    Period  Weeks    Status  Partially Met    Target Date  09/07/18      PT LONG TERM GOAL #5   Title  Complete community, work and/or recreational activities without limitation due to current condition.     Baseline  difficulty with her usual ADLs, IADLs, household and community mobility, and community participation. (initial eval); continues to have pain at times but less disruptive and she is better able to manage it to continue usual activities.     Time  6    Period  Weeks    Status  Partially Met    Target Date  09/07/18            Plan - 09/01/18 1057    Clinical Impression Statement  Patient has completed 10 treatment sessions this episode of care. She made overall progress toward her goals and reports her pain is less frequent and less intense. She also reports improved ability to slef-manage her symptoms when they do occur. She demonstrates improve  lumbar ROM and LE strength. She continues to have interimittant hip pain that responds to lumbar extension exercises but the response is incomplete and returns with provocative activities. FOTO score has remained unchanged. Patient appears to have met greatest likely functional improvement is is ready for discharge at this point. Patient agrees with this plan. She was provided a robust HEP including exercises for generalized fitness program. Patient is a 73 y.o. female referred to outpatient physical therapy with a medical diagnosis of chronic right-sided low back pain without sciatica who presents with signs and symptoms consistent with low back pain with referral to right LE.     Rehab Potential  Good    Clinical Impairments Affecting Rehab Potential  (+) motivation, prior success with PT (-) possible visceral source of pain for flank symptoms, obesity, comorbidities    PT Frequency  2x / week    PT Duration  6 weeks    PT Treatment/Interventions  ADLs/Self Care Home Management;Aquatic Therapy;Biofeedback;Electrical Stimulation;Cryotherapy;Moist Heat;Gait training;Stair training;Functional mobility  training;Therapeutic activities;Therapeutic exercise;Balance training;Neuromuscular re-education;Patient/family education;Manual techniques;Passive range of motion;Dry needling;Spinal Manipulations;Joint Manipulations;Other (comment)   joint mobilizations grades I-V, pain neuroscience education   PT Next Visit Plan  patient is now discharged from physical therapy due to meeting maximal available improvement at this time.     PT Home Exercise Plan  Medbridge Access Code: [email or web address]     Consulted and Agree with Plan of Care  Patient       Patient will benefit from skilled therapeutic intervention in order to improve the following deficits and impairments:  Abnormal gait, Decreased activity tolerance, Decreased endurance, Decreased strength, Decreased range of motion, Impaired perceived functional ability,  Hypomobility, Pain, Obesity, Postural dysfunction, Impaired flexibility, Decreased balance, Decreased mobility  Visit Diagnosis: Chronic right-sided low back pain without sciatica  Pain in right hip  Muscle weakness (generalized)     Problem List Patient Active Problem List   Diagnosis Date Noted  . Advanced care planning/counseling discussion 12/17/2016  . Skin lesions, generalized 11/13/2016  . Chronic right hip pain 06/17/2016  . Vitamin D deficiency 09/26/2015  . Diastasis recti 08/08/2015  . Elevated serum GGT level 07/24/2015  . Essential hypertension 07/17/2015  . Fatty liver 07/17/2015  . Elevated alkaline phosphatase level 07/17/2015  . Elevated serum glutamic pyruvic transaminase (SGPT) level 07/17/2015  . Abnormal finding on EKG 07/11/2015  . Morbid obesity ([place]) 07/11/2015  . Colon, diverticulosis 07/11/2015  . Abdominal wall hernia 07/11/2015  . DM (diabetes mellitus), type 2 with neurological complications ([place])   . Hyperlipidemia     Nancy Nordmann, PT, DPT 09/01/2018, 10:59 AM  [facility] [address], [place], [address] Phone: [phone]   Fax:  [phone]  Name: Alyssa Mayo MRN: [id number] Date of Birth: [birth year]

## 2018-09-22 ENCOUNTER — Encounter: Payer: Medicare HMO | Admitting: Physical Therapy

## 2018-09-29 ENCOUNTER — Encounter: Payer: Medicare HMO | Admitting: Physical Therapy

## 2018-10-07 ENCOUNTER — Encounter: Payer: Medicare HMO | Admitting: Physical Therapy

## 2018-10-14 ENCOUNTER — Encounter: Payer: Medicare HMO | Admitting: Physical Therapy

## 2018-10-14 DIAGNOSIS — R69 Illness, unspecified: Secondary | ICD-10-CM | POA: Diagnosis not present

## 2018-10-28 DIAGNOSIS — E1165 Type 2 diabetes mellitus with hyperglycemia: Secondary | ICD-10-CM | POA: Diagnosis not present

## 2018-10-28 DIAGNOSIS — I10 Essential (primary) hypertension: Secondary | ICD-10-CM | POA: Diagnosis not present

## 2018-10-28 DIAGNOSIS — E782 Mixed hyperlipidemia: Secondary | ICD-10-CM | POA: Diagnosis not present

## 2018-11-22 DIAGNOSIS — H353211 Exudative age-related macular degeneration, right eye, with active choroidal neovascularization: Secondary | ICD-10-CM | POA: Diagnosis not present

## 2018-12-01 DIAGNOSIS — R69 Illness, unspecified: Secondary | ICD-10-CM | POA: Diagnosis not present

## 2018-12-07 DIAGNOSIS — G4733 Obstructive sleep apnea (adult) (pediatric): Secondary | ICD-10-CM | POA: Diagnosis not present

## 2018-12-16 ENCOUNTER — Ambulatory Visit (INDEPENDENT_AMBULATORY_CARE_PROVIDER_SITE_OTHER): Payer: Medicare HMO

## 2018-12-16 ENCOUNTER — Ambulatory Visit: Payer: Medicare HMO

## 2018-12-16 VITALS — Ht 64.0 in | Wt 222.8 lb

## 2018-12-16 DIAGNOSIS — Z0000 Encounter for general adult medical examination without abnormal findings: Secondary | ICD-10-CM

## 2018-12-16 NOTE — Patient Instructions (Signed)
[name] , Thank you for taking time to come for your Medicare Wellness Visit. I appreciate your ongoing commitment to your health goals. Please review the following plan we discussed and let me know if I can assist you in the future.   Screening recommendations/referrals: Colonoscopy: consultation scheduled for colonoscopy  Mammogram: completed 08/18/2018 Bone Density: completed Recommended yearly ophthalmology/optometry visit for glaucoma screening and checkup Recommended yearly dental visit for hygiene and checkup  Vaccinations: Influenza vaccine: due 03/2019 Pneumococcal vaccine: up to date Tdap vaccine: up to date Shingles vaccine: up to date    Advanced directives: Please let us know if you would like the paperwork to complete this next time you are in the office.   Conditions/risks identified: increase water intake to 6-8 glasses a day.  Diabetic- informed you about our new Chronic Care Management team, please let us know if you would like to speak with them in the future.   Next appointment: Follow up in one year for your annual wellness exam.   If you need anything please call me at [phone].   Preventive Care 74 Years and Older, Female Preventive care refers to lifestyle choices and visits with your health care provider that can promote health and wellness. What does preventive care include?  A yearly physical exam. This is also called an annual well check.  Dental exams once or twice a year.  Routine eye exams. Ask your health care provider how often you should have your eyes checked.  Personal lifestyle choices, including:  Daily care of your teeth and gums.  Regular physical activity.  Eating a healthy diet.  Avoiding tobacco and drug use.  Limiting alcohol use.  Practicing safe sex.  Taking low-dose aspirin every day.  Taking vitamin and mineral supplements as recommended by your health care provider. What happens during an annual well check? The  services and screenings done by your health care provider during your annual well check will depend on your age, overall health, lifestyle risk factors, and family history of disease. Counseling  Your health care provider may ask you questions about your:  Alcohol use.  Tobacco use.  Drug use.  Emotional well-being.  Home and relationship well-being.  Sexual activity.  Eating habits.  History of falls.  Memory and ability to understand (cognition).  Work and work Astronomer.  Reproductive health. Screening  You may have the following tests or measurements:  Height, weight, and BMI.  Blood pressure.  Lipid and cholesterol levels. These may be checked every 5 years, or more frequently if you are over 71 years old.  Skin check.  Lung cancer screening. You may have this screening every year starting at age 66 if you have a 30-pack-year history of smoking and currently smoke or have quit within the past 15 years.  Fecal occult blood test (FOBT) of the stool. You may have this test every year starting at age 60.  Flexible sigmoidoscopy or colonoscopy. You may have a sigmoidoscopy every 5 years or a colonoscopy every 10 years starting at age 69.  Hepatitis C blood test.  Hepatitis B blood test.  Sexually transmitted disease (STD) testing.  Diabetes screening. This is done by checking your blood sugar (glucose) after you have not eaten for a while (fasting). You may have this done every 1-3 years.  Bone density scan. This is done to screen for osteoporosis. You may have this done starting at age 51.  Mammogram. This may be done every 1-2 years. Talk to your  health care provider about how often you should have regular mammograms. Talk with your health care provider about your test results, treatment options, and if necessary, the need for more tests. Vaccines  Your health care provider may recommend certain vaccines, such as:  Influenza vaccine. This is recommended  every year.  Tetanus, diphtheria, and acellular pertussis (Tdap, Td) vaccine. You may need a Td booster every 10 years.  Zoster vaccine. You may need this after age 90.  Pneumococcal 13-valent conjugate (PCV13) vaccine. One dose is recommended after age 36.  Pneumococcal polysaccharide (PPSV23) vaccine. One dose is recommended after age 61. Talk to your health care provider about which screenings and vaccines you need and how often you need them. This information is not intended to replace advice given to you by your health care provider. Make sure you discuss any questions you have with your health care provider. Document Released: 08/03/2015 Document Revised: 03/26/2016 Document Reviewed: 05/08/2015 Elsevier Interactive Patient Education  2017 [place] Prevention in the Home Falls can cause injuries. They can happen to people of all ages. There are many things you can do to make your home safe and to help prevent falls. What can I do on the outside of my home?  Regularly fix the edges of walkways and driveways and fix any cracks.  Remove anything that might make you trip as you walk through a door, such as a raised step or threshold.  Trim any bushes or trees on the path to your home.  Use bright outdoor lighting.  Clear any walking paths of anything that might make someone trip, such as rocks or tools.  Regularly check to see if handrails are loose or broken. Make sure that both sides of any steps have handrails.  Any raised decks and porches should have guardrails on the edges.  Have any leaves, snow, or ice cleared regularly.  Use sand or salt on walking paths during winter.  Clean up any spills in your garage right away. This includes oil or grease spills. What can I do in the bathroom?  Use night lights.  Install grab bars by the toilet and in the tub and shower. Do not use towel bars as grab bars.  Use non-skid mats or decals in the tub or shower.  If  you need to sit down in the shower, use a plastic, non-slip stool.  Keep the floor dry. Clean up any water that spills on the floor as soon as it happens.  Remove soap buildup in the tub or shower regularly.  Attach bath mats securely with double-sided non-slip rug tape.  Do not have throw rugs and other things on the floor that can make you trip. What can I do in the bedroom?  Use night lights.  Make sure that you have a light by your bed that is easy to reach.  Do not use any sheets or blankets that are too big for your bed. They should not hang down onto the floor.  Have a firm chair that has side arms. You can use this for support while you get dressed.  Do not have throw rugs and other things on the floor that can make you trip. What can I do in the kitchen?  Clean up any spills right away.  Avoid walking on wet floors.  Keep items that you use a lot in easy-to-reach places.  If you need to reach something above you, use a strong step stool that has a  grab bar.  Keep electrical cords out of the way.  Do not use floor polish or wax that makes floors slippery. If you must use wax, use non-skid floor wax.  Do not have throw rugs and other things on the floor that can make you trip. What can I do with my stairs?  Do not leave any items on the stairs.  Make sure that there are handrails on both sides of the stairs and use them. Fix handrails that are broken or loose. Make sure that handrails are as long as the stairways.  Check any carpeting to make sure that it is firmly attached to the stairs. Fix any carpet that is loose or worn.  Avoid having throw rugs at the top or bottom of the stairs. If you do have throw rugs, attach them to the floor with carpet tape.  Make sure that you have a light switch at the top of the stairs and the bottom of the stairs. If you do not have them, ask someone to add them for you. What else can I do to help prevent falls?  Wear shoes  that:  Do not have high heels.  Have rubber bottoms.  Are comfortable and fit you well.  Are closed at the toe. Do not wear sandals.  If you use a stepladder:  Make sure that it is fully opened. Do not climb a closed stepladder.  Make sure that both sides of the stepladder are locked into place.  Ask someone to hold it for you, if possible.  Clearly mark and make sure that you can see:  Any grab bars or handrails.  First and last steps.  Where the edge of each step is.  Use tools that help you move around (mobility aids) if they are needed. These include:  Canes.  Walkers.  Scooters.  Crutches.  Turn on the lights when you go into a dark area. Replace any light bulbs as soon as they burn out.  Set up your furniture so you have a clear path. Avoid moving your furniture around.  If any of your floors are uneven, fix them.  If there are any pets around you, be aware of where they are.  Review your medicines with your doctor. Some medicines can make you feel dizzy. This can increase your chance of falling. Ask your doctor what other things that you can do to help prevent falls. This information is not intended to replace advice given to you by your health care provider. Make sure you discuss any questions you have with your health care provider. Document Released: 05/03/2009 Document Revised: 12/13/2015 Document Reviewed: 08/11/2014 Elsevier Interactive Patient Education  2017 [employer].

## 2018-12-16 NOTE — Progress Notes (Signed)
Subjective:   Alyssa Mayo is a 73 y.o. female who presents for Medicare Annual (Subsequent) preventive examination.  This visit is being conducted via phone call  - after an attmept to do on video chat - due to the COVID-19 pandemic. This patient has given me verbal consent via phone to conduct this visit, patient states they are participating from their home address. Some vital signs may be absent or patient reported.   Patient identification: identified by name, DOB, and current address.    Review of Systems:   Cardiac Risk Factors include: advanced age (>76men, >66 women);hypertension;dyslipidemia;diabetes mellitus     Objective:     Vitals: Ht 5\' 4"  (1.626 m) Comment: patient reported  Wt 222 lb 12.8 oz (101.1 kg) Comment: patient reported  BMI 38.24 kg/m   Body mass index is 38.24 kg/m.  Advanced Directives 12/16/2018 07/26/2018 12/10/2017 11/03/2015  Does Patient Have a Medical Advance Directive? No No No No  Would patient like information on creating a medical advance directive? - No - Patient declined No - Patient declined No - patient declined information    Tobacco Social History   Tobacco Use  Smoking Status Never Smoker  Smokeless Tobacco Never Used     Counseling given: Not Answered   Clinical Intake:  Pre-visit preparation completed: Yes  Pain : No/denies pain     Nutritional Risks: None Diabetes: Yes CBG done?: No Did pt. bring in CBG monitor from home?: No  How often do you need to have someone help you when you read instructions, pamphlets, or other written materials from your doctor or pharmacy?: 1 - Never  Nutrition Risk Assessment:  Has the patient had any N/V/D within the last 2 months?  no Does the patient have any non-healing wounds?  No  Has the patient had any unintentional weight loss or weight gain?  No   Diabetes:  Is the patient diabetic?  Yes  If diabetic, was a CBG obtained today?  No  Did the patient bring in their  glucometer from home?  n/a How often do you monitor your CBG's? daily.   Financial Strains and Diabetes Management:  Are you having any financial strains with the device, your supplies or your medication? No .  Does the patient want to be seen by Chronic Care Management for management of their diabetes?  No  Would the patient like to be referred to a Nutritionist or for Diabetic Management?  No   Diabetic Exams:  Diabetic Eye Exam: Completed 02/15/2018 at [facility]  Diabetic Foot Exam: due    Interpreter Needed?: No  Information entered by :: Tiffany Hill,LPN   Past Medical History:  Diagnosis Date  . Arthritis   . Diabetes mellitus without complication (HCC)   . Elevated liver enzymes   . Fatty liver   . Hyperlipidemia   . Lifelong obesity   . Sleep apnea    Past Surgical History:  Procedure Laterality Date  . ABDOMINAL HYSTERECTOMY    . APPENDECTOMY    . CERVICAL LAMINECTOMY  1987  . CESAREAN SECTION    . COLONOSCOPY  2014   diverticulosis, [facility] Dr. [name]   . SPINE SURGERY    . TUBAL LIGATION     Family History  Problem Relation Age of Onset  . Cancer Mother        breast  . Heart disease Mother   . Stroke Mother   . Diabetes Mother   . Cancer Father  leukemia  . Stroke Father   . Cancer Brother   . COPD Neg Hx   . Hypertension Neg Hx    Social History   Socioeconomic History  . Marital status: Widowed    Spouse name: Not on file  . Number of children: Not on file  . Years of education: Not on file  . Highest education level: Bachelor's degree (e.g., BA, AB, BS)  Occupational History  . Not on file  Social Needs  . Financial resource strain: Not hard at all  . Food insecurity:    Worry: Never true    Inability: Never true  . Transportation needs:    Medical: No    Non-medical: No  Tobacco Use  . Smoking status: Never Smoker  . Smokeless tobacco: Never Used  Substance and Sexual Activity  . Alcohol use: Yes     Alcohol/week: 0.0 standard drinks    Comment: rare  . Drug use: No  . Sexual activity: Not on file  Lifestyle  . Physical activity:    Days per week: 0 days    Minutes per session: 0 min  . Stress: Not at all  Relationships  . Social connections:    Talks on phone: More than three times a week    Gets together: More than three times a week    Attends religious service: More than 4 times per year    Active member of club or organization: Yes    Attends meetings of clubs or organizations: More than 4 times per year    Relationship status: Married  Other Topics Concern  . Not on file  Social History Narrative  . Not on file    Outpatient Encounter Medications as of 12/16/2018  Medication Sig  . aspirin EC 81 MG tablet Take 1 tablet (81 mg total) by mouth daily. Take at least one hour BEFORE the Celebrex  . celecoxib (CELEBREX) 200 MG capsule Take 1 capsule (200 mg total) by mouth daily.  . dapagliflozin propanediol (FARXIGA) 10 MG TABS tablet   . glipiZIDE (GLUCOTROL XL) 5 MG 24 hr tablet Take by mouth.  Marland Kitchen. glucose blood (ONE TOUCH ULTRA TEST) test strip CHECK BLOOD SUGAR AS DIRECTED.  Marland Kitchen. metFORMIN (GLUCOPHAGE) 500 MG tablet Take 2 tablets (1,000 mg total) by mouth 2 (two) times daily.  . Multiple Vitamins-Minerals (PRESERVISION AREDS 2) CAPS Take 2 tablets by mouth daily.  . pantoprazole (PROTONIX) 40 MG tablet Take 1 tablet (40 mg total) by mouth daily.  . simvastatin (ZOCOR) 40 MG tablet Take 1 tablet (40 mg total) by mouth daily.  . [DISCONTINUED] FARXIGA 10 MG TABS tablet   . [DISCONTINUED] gabapentin (NEURONTIN) 100 MG capsule   . [DISCONTINUED] INVOKANA 300 MG TABS tablet   . [DISCONTINUED] Multiple Vitamins-Minerals (PRESERVISION AREDS 2+MULTI VIT) CAPS Take by mouth.  . [DISCONTINUED] naproxen (NAPROSYN) 500 MG tablet Take 1 tablet (500 mg total) by mouth 2 (two) times daily with a meal. (Patient not taking: Reported on 07/26/2018)  . [DISCONTINUED] tiZANidine (ZANAFLEX) 4 MG  tablet Take 1 tablet (4 mg total) by mouth at bedtime. (Patient not taking: Reported on 07/26/2018)   No facility-administered encounter medications on file as of 12/16/2018.     Activities of Daily Living In your present state of health, do you have any difficulty performing the following activities: 12/16/2018  Hearing? Y  Vision? N  Comment macular degeneration- gets injections  Difficulty concentrating or making decisions? N  Walking or climbing stairs? N  Dressing  or bathing? N  Doing errands, shopping? N  Preparing Food and eating ? N  Using the Toilet? N  In the past six months, have you accidently leaked urine? N  Do you have problems with loss of bowel control? N  Managing your Medications? N  Managing your Finances? N  Housekeeping or managing your Housekeeping? N  Some recent data might be hidden    Patient Care Team: Steele Sizer, MD as PCP - General (Family Medicine) Lockie Mola, MD as Referring Physician (Ophthalmology) [name], MD as Consulting Physician (Gastroenterology) Deeann Saint, MD (Specialist) Bud Face, MD as Referring Physician (Otolaryngology) Lemar Livings Merrily Pew, MD as Consulting Physician (General Surgery) Kerman Passey, MD as Referring Physician (Family Medicine)    Assessment:   This is a routine wellness examination for [facility].  Exercise Activities and Dietary recommendations Current Exercise Habits: The patient does not participate in regular exercise at present, Exercise limited by: None identified  Goals    . DIET - INCREASE WATER INTAKE     Recommend drinking at least 6-8 glasses of water a day        Fall Risk: Fall Risk  12/16/2018 12/10/2017 09/23/2017 08/31/2017 04/27/2017  Falls in the past year? 0 No No No No  Number falls in past yr: - - - - -  Injury with Fall? - - - - -    FALL RISK PREVENTION PERTAINING TO THE HOME:  Any stairs in or around the home? Yes  If so, are there any without  handrails? No   Home free of loose throw rugs in walkways, pet beds, electrical cords, etc? Yes  Adequate lighting in your home to reduce risk of falls? Yes   ASSISTIVE DEVICES UTILIZED TO PREVENT FALLS:  Life alert? No  Use of a cane, walker or w/c? No  Grab bars in the bathroom? No  Shower chair or bench in shower? No  Elevated toilet seat or a handicapped toilet? No   TIMED UP AND GO:  Unable to perform   Depression Screen PHQ 2/9 Scores 12/16/2018 12/10/2017 08/31/2017 04/27/2017  PHQ - 2 Score 0 0 0 0     Cognitive Function     6CIT Screen 12/10/2017  What Year? 0 points  What month? 0 points  What time? 0 points  Count back from 20 0 points  Months in reverse 0 points  Repeat phrase 0 points  Total Score 0    Immunization History  Administered Date(s) Administered  . Influenza, High Dose Seasonal PF 06/09/2016  . Influenza,inj,Quad PF,6+ Mos 06/04/2015  . Influenza,inj,quad, With Preservative 06/09/2016  . Influenza-Unspecified 04/26/2014  . Pneumococcal Conjugate-13 11/15/2014  . Pneumococcal-Unspecified 05/02/2008, 10/25/2013, 07/21/2014  . Td 08/27/2005  . Tdap 05/20/2012  . Zoster 09/08/2016    Qualifies for Shingles Vaccine? Yes  Zostavax completed 09/08/2016. Due for Shingrix. Education has been provided regarding the importance of this vaccine. Pt has been advised to call insurance company to determine out of pocket expense. Advised may also receive vaccine at local pharmacy or Health Dept. Verbalized acceptance and understanding.  Tdap: up to date  Flu Vaccine:due 03/2019  Pneumococcal Vaccine: up to date  Screening Tests Health Maintenance  Topic Date Due  . FOOT EXAM  09/08/2017  . COLONOSCOPY  05/27/2018  . HEMOGLOBIN A1C  12/29/2018  . OPHTHALMOLOGY EXAM  02/16/2019  . INFLUENZA VACCINE  02/19/2019  . URINE MICROALBUMIN  06/09/2019  . MAMMOGRAM  08/19/2019  . TETANUS/TDAP  05/20/2022  .  DEXA SCAN  Completed  . PNA vac Low Risk Adult   Completed  . Hepatitis C Screening  Addressed    Cancer Screenings:  Colorectal Screening: Completed 05/27/2013 . Repeat every 5 years; patient is shceduled for consultation with Dr.Skulskie for colonoscopy   Mammogram: Completed 08/18/2018. Repeat every year;  Bone Density: Completed 10/14/2010.   Lung Cancer Screening: (Low Dose CT Chest recommended if Age 91-80 years, 30 pack-year currently smoking OR have quit w/in 15years.) does not qualify.    Additional Screening:  Hepatitis C Screening: does qualify; Completed 05/03/2007  Dental Screening: Recommended annual dental exams for proper oral hygiene   Community Resource Referral:  CRR required this visit?  No       Plan:  I have personally reviewed and addressed the Medicare Annual Wellness questionnaire and have noted the following in the patient's chart:  A. Medical and social history B. Use of alcohol, tobacco or illicit drugs  C. Current medications and supplements D. Functional ability and status E.  Nutritional status F.  Physical activity G. Advance directives H. List of other physicians I.  Hospitalizations, surgeries, and ER visits in previous 12 months J.  Vitals K. Screenings such as hearing and vision if needed, cognitive and depression L. Referrals and appointments   In addition, I have reviewed and discussed with patient certain preventive protocols, quality metrics, and best practice recommendations. A written personalized care plan for preventive services as well as general preventive health recommendations were provided to patient.   Signed,    Collene Schlichter, LPN  1/61/0960 Nurse Health Advisor   Nurse Notes: none

## 2018-12-31 ENCOUNTER — Other Ambulatory Visit: Payer: Self-pay | Admitting: Gastroenterology

## 2018-12-31 DIAGNOSIS — K7400 Hepatic fibrosis, unspecified: Secondary | ICD-10-CM

## 2018-12-31 DIAGNOSIS — Z800 Family history of malignant neoplasm of digestive organs: Secondary | ICD-10-CM | POA: Diagnosis not present

## 2019-01-07 ENCOUNTER — Ambulatory Visit
Admission: RE | Admit: 2019-01-07 | Discharge: 2019-01-07 | Disposition: A | Discharge status: Home / Self Care | Payer: Medicare HMO | Source: Ambulatory Visit | Attending: Gastroenterology | Admitting: Gastroenterology

## 2019-01-07 ENCOUNTER — Other Ambulatory Visit: Payer: Self-pay

## 2019-01-07 DIAGNOSIS — N281 Cyst of kidney, acquired: Secondary | ICD-10-CM | POA: Diagnosis not present

## 2019-01-07 DIAGNOSIS — G4733 Obstructive sleep apnea (adult) (pediatric): Secondary | ICD-10-CM | POA: Diagnosis not present

## 2019-01-07 DIAGNOSIS — K7400 Hepatic fibrosis, unspecified: Secondary | ICD-10-CM

## 2019-01-17 DIAGNOSIS — G4733 Obstructive sleep apnea (adult) (pediatric): Secondary | ICD-10-CM | POA: Diagnosis not present

## 2019-02-01 ENCOUNTER — Other Ambulatory Visit
Admission: RE | Admit: 2019-02-01 | Discharge: 2019-02-01 | Disposition: A | Discharge status: Home / Self Care | Payer: Medicare HMO | Source: Ambulatory Visit | Attending: Gastroenterology | Admitting: Gastroenterology

## 2019-02-01 ENCOUNTER — Other Ambulatory Visit: Payer: Self-pay

## 2019-02-01 DIAGNOSIS — Z1159 Encounter for screening for other viral diseases: Secondary | ICD-10-CM | POA: Insufficient documentation

## 2019-02-01 LAB — SARS CORONAVIRUS 2 (TAT 6-24 HRS): SARS Coronavirus 2: NEGATIVE

## 2019-02-03 ENCOUNTER — Encounter: Payer: Self-pay | Admitting: *Deleted

## 2019-02-04 ENCOUNTER — Ambulatory Visit: Payer: Medicare HMO | Admitting: Anesthesiology

## 2019-02-04 ENCOUNTER — Encounter
Admission: RE | Disposition: A | Discharge status: Home / Self Care | Payer: Self-pay | Source: Home / Self Care | Attending: Gastroenterology

## 2019-02-04 ENCOUNTER — Encounter: Payer: Self-pay | Admitting: *Deleted

## 2019-02-04 ENCOUNTER — Other Ambulatory Visit: Payer: Self-pay

## 2019-02-04 ENCOUNTER — Ambulatory Visit
Admission: RE | Admit: 2019-02-04 | Discharge: 2019-02-04 | Disposition: A | Discharge status: Home / Self Care | Payer: Medicare HMO | Attending: Gastroenterology | Admitting: Gastroenterology

## 2019-02-04 DIAGNOSIS — K224 Dyskinesia of esophagus: Secondary | ICD-10-CM | POA: Diagnosis not present

## 2019-02-04 DIAGNOSIS — K228 Other specified diseases of esophagus: Secondary | ICD-10-CM | POA: Diagnosis not present

## 2019-02-04 DIAGNOSIS — Z1211 Encounter for screening for malignant neoplasm of colon: Secondary | ICD-10-CM | POA: Diagnosis not present

## 2019-02-04 DIAGNOSIS — K5790 Diverticulosis of intestine, part unspecified, without perforation or abscess without bleeding: Secondary | ICD-10-CM | POA: Diagnosis not present

## 2019-02-04 DIAGNOSIS — I10 Essential (primary) hypertension: Secondary | ICD-10-CM | POA: Insufficient documentation

## 2019-02-04 DIAGNOSIS — K5730 Diverticulosis of large intestine without perforation or abscess without bleeding: Secondary | ICD-10-CM | POA: Diagnosis not present

## 2019-02-04 DIAGNOSIS — Z7984 Long term (current) use of oral hypoglycemic drugs: Secondary | ICD-10-CM | POA: Diagnosis not present

## 2019-02-04 DIAGNOSIS — Z8371 Family history of colonic polyps: Secondary | ICD-10-CM | POA: Insufficient documentation

## 2019-02-04 DIAGNOSIS — K644 Residual hemorrhoidal skin tags: Secondary | ICD-10-CM | POA: Diagnosis not present

## 2019-02-04 DIAGNOSIS — E119 Type 2 diabetes mellitus without complications: Secondary | ICD-10-CM | POA: Diagnosis not present

## 2019-02-04 DIAGNOSIS — K7460 Unspecified cirrhosis of liver: Secondary | ICD-10-CM | POA: Insufficient documentation

## 2019-02-04 DIAGNOSIS — Z6834 Body mass index (BMI) 34.0-34.9, adult: Secondary | ICD-10-CM | POA: Diagnosis not present

## 2019-02-04 DIAGNOSIS — Z791 Long term (current) use of non-steroidal anti-inflammatories (NSAID): Secondary | ICD-10-CM | POA: Insufficient documentation

## 2019-02-04 DIAGNOSIS — Z800 Family history of malignant neoplasm of digestive organs: Secondary | ICD-10-CM | POA: Diagnosis not present

## 2019-02-04 DIAGNOSIS — Q438 Other specified congenital malformations of intestine: Secondary | ICD-10-CM | POA: Diagnosis not present

## 2019-02-04 DIAGNOSIS — E785 Hyperlipidemia, unspecified: Secondary | ICD-10-CM | POA: Insufficient documentation

## 2019-02-04 DIAGNOSIS — E669 Obesity, unspecified: Secondary | ICD-10-CM | POA: Diagnosis not present

## 2019-02-04 DIAGNOSIS — K449 Diaphragmatic hernia without obstruction or gangrene: Secondary | ICD-10-CM | POA: Insufficient documentation

## 2019-02-04 DIAGNOSIS — I8510 Secondary esophageal varices without bleeding: Secondary | ICD-10-CM | POA: Insufficient documentation

## 2019-02-04 DIAGNOSIS — Z7982 Long term (current) use of aspirin: Secondary | ICD-10-CM | POA: Diagnosis not present

## 2019-02-04 DIAGNOSIS — Z79899 Other long term (current) drug therapy: Secondary | ICD-10-CM | POA: Diagnosis not present

## 2019-02-04 DIAGNOSIS — G4730 Sleep apnea, unspecified: Secondary | ICD-10-CM | POA: Insufficient documentation

## 2019-02-04 DIAGNOSIS — K648 Other hemorrhoids: Secondary | ICD-10-CM | POA: Diagnosis not present

## 2019-02-04 DIAGNOSIS — I8500 Esophageal varices without bleeding: Secondary | ICD-10-CM | POA: Diagnosis not present

## 2019-02-04 DIAGNOSIS — K635 Polyp of colon: Secondary | ICD-10-CM | POA: Diagnosis not present

## 2019-02-04 DIAGNOSIS — K740 Hepatic fibrosis: Secondary | ICD-10-CM | POA: Diagnosis not present

## 2019-02-04 HISTORY — PX: ESOPHAGOGASTRODUODENOSCOPY (EGD) WITH PROPOFOL: SHX5813

## 2019-02-04 HISTORY — PX: COLONOSCOPY WITH PROPOFOL: SHX5780

## 2019-02-04 LAB — CBC WITH DIFFERENTIAL/PLATELET
Abs Immature Granulocytes: 0.04 10*3/uL (ref 0.00–0.07)
Basophils Absolute: 0 10*3/uL (ref 0.0–0.1)
Basophils Relative: 0 %
Eosinophils Absolute: 0.2 10*3/uL (ref 0.0–0.5)
Eosinophils Relative: 2 %
HCT: 42.6 % (ref 36.0–46.0)
Hemoglobin: 13.7 g/dL (ref 12.0–15.0)
Immature Granulocytes: 0 %
Lymphocytes Relative: 18 %
Lymphs Abs: 1.7 10*3/uL (ref 0.7–4.0)
MCH: 28.1 pg (ref 26.0–34.0)
MCHC: 32.2 g/dL (ref 30.0–36.0)
MCV: 87.3 fL (ref 80.0–100.0)
Monocytes Absolute: 0.5 10*3/uL (ref 0.1–1.0)
Monocytes Relative: 6 %
Neutro Abs: 6.9 10*3/uL (ref 1.7–7.7)
Neutrophils Relative %: 74 %
Platelets: 283 10*3/uL (ref 150–400)
RBC: 4.88 MIL/uL (ref 3.87–5.11)
RDW: 14.7 % (ref 11.5–15.5)
WBC: 9.3 10*3/uL (ref 4.0–10.5)
nRBC: 0 % (ref 0.0–0.2)

## 2019-02-04 LAB — GLUCOSE, CAPILLARY: Glucose-Capillary: 166 mg/dL — ABNORMAL HIGH (ref 70–99)

## 2019-02-04 LAB — PROTIME-INR
INR: 1 (ref 0.8–1.2)
Prothrombin Time: 12.8 seconds (ref 11.4–15.2)

## 2019-02-04 LAB — HM COLONOSCOPY

## 2019-02-04 SURGERY — COLONOSCOPY WITH PROPOFOL
Anesthesia: General

## 2019-02-04 MED ORDER — LIDOCAINE HCL (CARDIAC) PF 100 MG/5ML IV SOSY
PREFILLED_SYRINGE | INTRAVENOUS | Status: DC | PRN
  Administered 2019-02-04: 50 mg via INTRAVENOUS

## 2019-02-04 MED ORDER — PROPOFOL 500 MG/50ML IV EMUL
INTRAVENOUS | Status: AC
  Filled 2019-02-04: qty 50

## 2019-02-04 MED ORDER — LIDOCAINE HCL (PF) 2 % IJ SOLN
INTRAMUSCULAR | Status: AC
  Filled 2019-02-04: qty 10

## 2019-02-04 MED ORDER — PROPOFOL 500 MG/50ML IV EMUL
INTRAVENOUS | Status: DC | PRN
  Administered 2019-02-04: 140 ug/kg/min via INTRAVENOUS

## 2019-02-04 MED ORDER — PROPOFOL 10 MG/ML IV BOLUS
INTRAVENOUS | Status: DC | PRN
  Administered 2019-02-04: 25 mg via INTRAVENOUS
  Administered 2019-02-04: 80 mg via INTRAVENOUS
  Administered 2019-02-04 (×3): 25 mg via INTRAVENOUS

## 2019-02-04 MED ORDER — SODIUM CHLORIDE 0.9 % IV SOLN
INTRAVENOUS | Status: DC
  Administered 2019-02-04: 09:00:00 1000 mL via INTRAVENOUS

## 2019-02-04 MED ORDER — SODIUM CHLORIDE 0.9 % IV SOLN: INTRAVENOUS | Status: DC

## 2019-02-04 NOTE — H&P (Signed)
Outpatient short stay form Pre-procedure 02/04/2019 9:36 AM Lollie Sails MD  Primary Physician: Dr. Golden Pop  Reason for visit: EGD and colonoscopy  History of present illness: Patient is a 73 year old female presenting today for an EGD and colonoscopy in regards to her family history of colon cancer primary relative, mother, as well as a personal history of fibrosis of the liver/possible cirrhosis of the liver/NAFLD.  Variceal screening.  Patient tolerated her prep well.  She takes no aspirin or blood thinning agent with the exception of 81 mg aspirin that is been held.  Her INR today was 1.0 platelet count 272    Current Facility-Administered Medications:  .  0.9 %  sodium chloride infusion, , Intravenous, Continuous, Lollie Sails, MD .  0.9 %  sodium chloride infusion, , Intravenous, Continuous, Lollie Sails, MD, Last Rate: 20 mL/hr at 02/04/19 0911, 1,000 mL at 02/04/19 0911  Medications Prior to Admission  Medication Sig Dispense Refill Last Dose  . dapagliflozin propanediol (FARXIGA) 10 MG TABS tablet    Past Week at Unknown time  . glucose blood (ONE TOUCH ULTRA TEST) test strip CHECK BLOOD SUGAR AS DIRECTED. 100 each 12 Past Week at Unknown time  . metFORMIN (GLUCOPHAGE) 500 MG tablet Take 2 tablets (1,000 mg total) by mouth 2 (two) times daily. 360 tablet 4 Past Week at Unknown time  . Multiple Vitamins-Minerals (PRESERVISION AREDS 2) CAPS Take 2 tablets by mouth daily.   Past Week at Unknown time  . pantoprazole (PROTONIX) 40 MG tablet Take 1 tablet (40 mg total) by mouth daily. 90 tablet 4 Past Week at Unknown time  . simvastatin (ZOCOR) 40 MG tablet Take 1 tablet (40 mg total) by mouth daily. 90 tablet 4 02/03/2019 at Unknown time  . aspirin EC 81 MG tablet Take 1 tablet (81 mg total) by mouth daily. Take at least one hour BEFORE the Celebrex 30 tablet 11   . celecoxib (CELEBREX) 200 MG capsule Take 1 capsule (200 mg total) by mouth daily. 90 capsule 4   .  glipiZIDE (GLUCOTROL XL) 5 MG 24 hr tablet Take by mouth.        Allergies  Allergen Reactions  . Neosporin [Neomycin-Bacitracin Zn-Polymyx] Hives  . Ozempic [Semaglutide] Hives  . Sulfa Antibiotics Itching     Past Medical History:  Diagnosis Date  . Arthritis   . Diabetes mellitus without complication ([facility])   . Elevated liver enzymes   . Fatty liver   . Hyperlipidemia   . Lifelong obesity   . Sleep apnea     Review of systems:      Physical Exam    Heart and lungs: Regular rate and rhythm without rub or gallop lungs are bilaterally clear    HEENT: Normocephalic atraumatic eyes are anicteric    Other:    Pertinant exam for procedure: Soft nontender nondistended bowel sounds positive normoactive    Planned proceedures: EGD, colonoscopy and indicated procedures. I have discussed the risks benefits and complications of procedures to include not limited to bleeding, infection, perforation and the risk of sedation and the patient wishes to proceed.    Lollie Sails, MD Gastroenterology 02/04/2019  9:36 AM

## 2019-02-04 NOTE — Op Note (Signed)
[facility] Gastroenterology Patient Name: Alyssa Mayo Procedure Date: 02/04/2019 9:36 AM MRN: [id number] Account #: [id number] Date of Birth: Nov 21, 1945 Admit Type: Outpatient Age: 73 Room: [facility] ENDO ROOM 1 Gender: Female Note Status: Finalized Procedure:            Upper GI endoscopy Indications:          Cirrhosis rule out esophageal varices Providers:            Lollie Sails, MD Referring MD:         Guadalupe Maple, MD (Referring MD) Medicines:            Monitored Anesthesia Care Complications:        No immediate complications. Procedure:            Pre-Anesthesia Assessment:                       - ASA Grade Assessment: III - A patient with severe                        systemic disease.                       After obtaining informed consent, the endoscope was                        passed under direct vision. Throughout the procedure,                        the patient's blood pressure, pulse, and oxygen                        saturations were monitored continuously. The Endoscope                        was introduced through the mouth, and advanced to the                        third part of duodenum. The upper GI endoscopy was                        performed with moderate difficulty due to patient                        intolerance of esophageal intubation and the patient's                        oxygen desaturation. Successful completion of the                        procedure was aided by managing the patient's medical                        instability. The patient tolerated the procedure. Findings:      Grade I varices were found in the middle third of the esophagus. They       were small in size.      A small hiatal hernia was present.      Abnormal motility was noted at the gastroesophageal junction. The       cricopharyngeus was normal. The distal esophagus/lower esophageal  sphincter is open. Biopsies were not done at the GE  junction due to       presence of varices and patient movement. No other abnormality noted.      The stomach was normal.      The cardia and gastric fundus were normal on retroflexion.      The examined duodenum was normal. Impression:           - Grade I esophageal varices.                       - Small hiatal hernia.                       - Abnormal esophageal motility.                       - Normal stomach.                       - Normal stomach.                       - Normal examined duodenum.                       - No specimens collected. Recommendation:       - Continue present medications.                       - Repeat upper endoscopy in 2 years for surveillance. Procedure Code(s):    --- Professional ---                       [phone], Esophagogastroduodenoscopy, flexible, transoral;                        diagnostic, including collection of specimen(s) by                        brushing or washing, when performed (separate procedure) Diagnosis Code(s):    --- Professional ---                       K74.60, Unspecified cirrhosis of liver                       I85.10, Secondary esophageal varices without bleeding                       K44.9, Diaphragmatic hernia without obstruction or                        gangrene                       K22.4, Dyskinesia of esophagus CPT copyright 2019 American Medical Association. All rights reserved. The codes documented in this report are preliminary and upon coder review may  be revised to meet current compliance requirements. [name], MD 02/04/2019 10:00:06 AM This report has been signed electronically. Number of Addenda: 0 Note Initiated On: 02/04/2019 9:36 AM      [facility]

## 2019-02-04 NOTE — Op Note (Signed)
[facility] Gastroenterology Patient Name: Alyssa HarmsMary Mayo Procedure Date: 02/04/2019 9:36 AM MRN: [id number] Account #: [id number] Date of Birth: 04/20/1946 Admit Type: Outpatient Age: [age] [facility] 1 Gender: Female Note Status: Finalized Procedure:            Colonoscopy Indications:          Family history of colonic polyps in a first-degree                        relative Providers:            Christena DeemMartin U. , MD Referring MD:         Steele SizerMark A. Crissman, MD (Referring MD) Medicines:            Monitored Anesthesia Care Complications:        No immediate complications. Procedure:            Pre-Anesthesia Assessment:                       - ASA Grade Assessment: III - A patient with severe                        systemic disease.                       After obtaining informed consent, the colonoscope was                        passed under direct vision. Throughout the procedure,                        the patient's blood pressure, pulse, and oxygen                        saturations were monitored continuously. The                        Colonoscope was introduced through the anus and                        advanced to the the cecum, identified by appendiceal                        orifice and ileocecal valve. The colonoscopy was                        performed with moderate difficulty due to poor bowel                        prep, poor endoscopic visualization and a redundant                        colon. Successful completion of the procedure was aided                        by changing the patient to a supine position, changing                        the patient to a prone position and using manual  pressure. Findings:      Multiple small-mouthed diverticula were found in the sigmoid colon and       descending colon.      The sigmoid colon, descending colon and transverse colon were       significantly redundant. A possible  polyp, very small was noted in the       proximal descending however not revisualized with poor prep despite       multiple passes through the area. Benign/hyperplastic in appearance.      The retroflexed view of the distal rectum and anal verge was normal and       showed no anal or rectal abnormalities.      The digital rectal exam was normal. Impression:           - Diverticulosis in the sigmoid colon and in the                        descending colon.                       - Redundant colon.                       - The distal rectum and anal verge are normal on                        retroflexion view.                       - No specimens collected. Recommendation:       - Repeat colonoscopy in 3 years for screening purposes. Procedure Code(s):    --- Professional ---                       [id number], Colonoscopy, flexible; diagnostic, including                        collection of specimen(s) by brushing or washing, when                        performed (separate procedure) Diagnosis Code(s):    --- Professional ---                       Z83.71, Family history of colonic polyps                       K57.30, Diverticulosis of large intestine without                        perforation or abscess without bleeding                       Q43.8, Other specified congenital malformations of                        intestine CPT copyright 2019 American Medical Association. All rights reserved. The codes documented in this report are preliminary and upon coder review may  be revised to meet current compliance requirements. Christena DeemMartin U , MD 02/04/2019 10:45:22 AM This report has been signed electronically. Number of Addenda: 0 Note Initiated On: 02/04/2019 9:36 AM Scope Withdrawal Time: 0 hours 13 minutes 0 seconds  Total Procedure Duration: 0 hours  35 minutes 48 seconds       [facility]

## 2019-02-04 NOTE — Anesthesia Post-op Follow-up Note (Signed)
Anesthesia QCDR form completed.        

## 2019-02-04 NOTE — Transfer of Care (Signed)
Immediate Anesthesia Transfer of Care Note  Patient: Alyssa Mayo  Procedure(s) Performed: COLONOSCOPY WITH PROPOFOL (N/A ) ESOPHAGOGASTRODUODENOSCOPY (EGD) WITH PROPOFOL (N/A )  Patient Location: PACU and Endoscopy Unit  Anesthesia Type:General  Level of Consciousness: awake, alert , oriented and patient cooperative  Airway & Oxygen Therapy: Patient Spontanous Breathing  Post-op Assessment: Report given to RN and Post -op Vital signs reviewed and stable  Post vital signs: Reviewed and stable  Last Vitals:  Vitals Value Taken Time  BP 158/74 02/04/19 1043  Temp 36.9 C 02/04/19 1043  Pulse 94 02/04/19 1044  Resp 27 02/04/19 1044  SpO2 99 % 02/04/19 1044  Vitals shown include unvalidated device data.  Last Pain:  Vitals:   02/04/19 0829  TempSrc: Oral  PainSc: 0-No pain      Patients Stated Pain Goal: 0 (35/00/93 8182)  Complications: No apparent anesthesia complications

## 2019-02-04 NOTE — Anesthesia Preprocedure Evaluation (Signed)
Anesthesia Evaluation  Patient identified by MRN, date of birth, ID band Patient awake    Reviewed: Allergy & Precautions, H&P , NPO status , Patient's Chart, lab work & pertinent test results  History of Anesthesia Complications Negative for: history of anesthetic complications  Airway Mallampati: III  TM Distance: <3 FB Neck ROM: limited    Dental  (+) Chipped   Pulmonary neg shortness of breath, sleep apnea and Continuous Positive Airway Pressure Ventilation ,           Cardiovascular Exercise Tolerance: Good hypertension, Pt. on medications (-) angina(-) Past MI and (-) DOE      Neuro/Psych  Neuromuscular disease negative psych ROS   GI/Hepatic negative GI ROS, Neg liver ROS, neg GERD  ,  Endo/Other  diabetes, Type 2, Oral Hypoglycemic Agents  Renal/GU negative Renal ROS  negative genitourinary   Musculoskeletal  (+) Arthritis ,   Abdominal   Peds  Hematology negative hematology ROS (+)   Anesthesia Other Findings Past Medical History: No date: Arthritis No date: Diabetes mellitus without complication (HCC) No date: Elevated liver enzymes No date: Fatty liver No date: Hyperlipidemia No date: Lifelong obesity No date: Sleep apnea  Past Surgical History: No date: ABDOMINAL HYSTERECTOMY No date: APPENDECTOMY 1987: CERVICAL LAMINECTOMY No date: CESAREAN SECTION 2014: COLONOSCOPY     Comment:  diverticulosis, [facility] Dr. Donnella Sham  No date: SPINE SURGERY No date: TUBAL LIGATION  BMI    Body Mass Index: 37.76 kg/m      Reproductive/Obstetrics negative OB ROS                             Anesthesia Physical Anesthesia Plan  ASA: III  Anesthesia Plan: General   Post-op Pain Management:    Induction: Intravenous  PONV Risk Score and Plan: Propofol infusion and TIVA  Airway Management Planned: Natural Airway and Nasal Cannula  Additional Equipment:   Intra-op Plan:    Post-operative Plan:   Informed Consent: I have reviewed the patients History and Physical, chart, labs and discussed the procedure including the risks, benefits and alternatives for the proposed anesthesia with the patient or authorized representative who has indicated his/her understanding and acceptance.     Dental Advisory Given  Plan Discussed with: Anesthesiologist, CRNA and Surgeon  Anesthesia Plan Comments: (Patient consented for risks of anesthesia including but not limited to:  - adverse reactions to medications - risk of intubation if required - damage to teeth, lips or other oral mucosa - sore throat or hoarseness - Damage to heart, brain, lungs or loss of life  Patient voiced understanding.)        Anesthesia Quick Evaluation

## 2019-02-04 NOTE — Anesthesia Postprocedure Evaluation (Signed)
Anesthesia Post Note  Patient: Alyssa Mayo  Procedure(s) Performed: COLONOSCOPY WITH PROPOFOL (N/A ) ESOPHAGOGASTRODUODENOSCOPY (EGD) WITH PROPOFOL (N/A )  Patient location during evaluation: Endoscopy Anesthesia Type: General Level of consciousness: awake and alert Pain management: pain level controlled Vital Signs Assessment: post-procedure vital signs reviewed and stable Respiratory status: spontaneous breathing, nonlabored ventilation, respiratory function stable and patient connected to nasal cannula oxygen Cardiovascular status: blood pressure returned to baseline and stable Postop Assessment: no apparent nausea or vomiting Anesthetic complications: no     Last Vitals:  Vitals:   02/04/19 1103 02/04/19 1113  BP: 134/70 (!) 153/71  Pulse:    Resp:    Temp:    SpO2:      Last Pain:  Vitals:   02/04/19 1113  TempSrc:   PainSc: 0-No pain                 Precious Haws Emonnie Cannady

## 2019-02-05 DIAGNOSIS — R69 Illness, unspecified: Secondary | ICD-10-CM | POA: Diagnosis not present

## 2019-02-05 NOTE — Progress Notes (Signed)
Patient has sore mouth and tongue.  Instructed to gargle and rinse with warm salt water and if that does not help..... to call performing physician.

## 2019-02-06 DIAGNOSIS — G4733 Obstructive sleep apnea (adult) (pediatric): Secondary | ICD-10-CM | POA: Diagnosis not present

## 2019-02-07 ENCOUNTER — Encounter: Payer: Self-pay | Admitting: Gastroenterology

## 2019-02-07 DIAGNOSIS — H353211 Exudative age-related macular degeneration, right eye, with active choroidal neovascularization: Secondary | ICD-10-CM | POA: Diagnosis not present

## 2019-02-09 DIAGNOSIS — I10 Essential (primary) hypertension: Secondary | ICD-10-CM | POA: Diagnosis not present

## 2019-02-09 DIAGNOSIS — E669 Obesity, unspecified: Secondary | ICD-10-CM | POA: Diagnosis not present

## 2019-02-09 DIAGNOSIS — E782 Mixed hyperlipidemia: Secondary | ICD-10-CM | POA: Diagnosis not present

## 2019-02-09 DIAGNOSIS — E1140 Type 2 diabetes mellitus with diabetic neuropathy, unspecified: Secondary | ICD-10-CM | POA: Diagnosis not present

## 2019-02-09 DIAGNOSIS — E1165 Type 2 diabetes mellitus with hyperglycemia: Secondary | ICD-10-CM | POA: Diagnosis not present

## 2019-02-14 DIAGNOSIS — E119 Type 2 diabetes mellitus without complications: Secondary | ICD-10-CM | POA: Diagnosis not present

## 2019-02-14 LAB — HM DIABETES EYE EXAM

## 2019-03-09 DIAGNOSIS — G4733 Obstructive sleep apnea (adult) (pediatric): Secondary | ICD-10-CM | POA: Diagnosis not present

## 2019-03-11 DIAGNOSIS — E119 Type 2 diabetes mellitus without complications: Secondary | ICD-10-CM | POA: Diagnosis not present

## 2019-03-11 DIAGNOSIS — R6884 Jaw pain: Secondary | ICD-10-CM | POA: Diagnosis not present

## 2019-03-11 DIAGNOSIS — E8801 Alpha-1-antitrypsin deficiency: Secondary | ICD-10-CM | POA: Diagnosis not present

## 2019-03-11 DIAGNOSIS — E669 Obesity, unspecified: Secondary | ICD-10-CM | POA: Diagnosis not present

## 2019-03-11 DIAGNOSIS — K740 Hepatic fibrosis: Secondary | ICD-10-CM | POA: Diagnosis not present

## 2019-03-11 LAB — MICROALBUMIN, URINE: Microalb, Ur: 8.8

## 2019-03-18 DIAGNOSIS — R69 Illness, unspecified: Secondary | ICD-10-CM | POA: Diagnosis not present

## 2019-04-09 DIAGNOSIS — G4733 Obstructive sleep apnea (adult) (pediatric): Secondary | ICD-10-CM | POA: Diagnosis not present

## 2019-04-18 DIAGNOSIS — G4733 Obstructive sleep apnea (adult) (pediatric): Secondary | ICD-10-CM | POA: Diagnosis not present

## 2019-04-20 ENCOUNTER — Telehealth: Payer: Self-pay

## 2019-04-20 NOTE — Telephone Encounter (Signed)
Forwarding to Freight forwarder Copied from [place] [phone]. Topic: General - Call Back - No Documentation >> Apr 19, 2019  2:56 PM Erick Blinks wrote: Reason for CRM: Call back request, Pt sent urinalysis kit back on March 11, 2019 Best contact: [phone]

## 2019-04-22 NOTE — Telephone Encounter (Signed)
Contacted patient and advised that we do not have results for a urine specimen sent in 02/2019. Patient does not remember the name of the company that sent the kit however will call back if she receives the results or remembers the name of the company. °

## 2019-04-24 ENCOUNTER — Other Ambulatory Visit: Payer: Self-pay | Admitting: Family Medicine

## 2019-04-24 DIAGNOSIS — E785 Hyperlipidemia, unspecified: Secondary | ICD-10-CM

## 2019-04-24 NOTE — Telephone Encounter (Signed)
Needs office visit for further refills 04/24/2019.  30 day courtesy refill given.

## 2019-04-26 DIAGNOSIS — H353211 Exudative age-related macular degeneration, right eye, with active choroidal neovascularization: Secondary | ICD-10-CM | POA: Diagnosis not present

## 2019-04-26 LAB — HM DIABETES EYE EXAM

## 2019-05-09 ENCOUNTER — Other Ambulatory Visit: Payer: Self-pay | Admitting: Family Medicine

## 2019-05-09 ENCOUNTER — Other Ambulatory Visit: Payer: Self-pay | Admitting: Neurology

## 2019-05-09 ENCOUNTER — Other Ambulatory Visit (HOSPITAL_COMMUNITY): Payer: Self-pay | Admitting: Neurology

## 2019-05-09 DIAGNOSIS — E531 Pyridoxine deficiency: Secondary | ICD-10-CM | POA: Diagnosis not present

## 2019-05-09 DIAGNOSIS — E519 Thiamine deficiency, unspecified: Secondary | ICD-10-CM | POA: Diagnosis not present

## 2019-05-09 DIAGNOSIS — R200 Anesthesia of skin: Secondary | ICD-10-CM | POA: Diagnosis not present

## 2019-05-09 DIAGNOSIS — G4733 Obstructive sleep apnea (adult) (pediatric): Secondary | ICD-10-CM | POA: Diagnosis not present

## 2019-05-09 DIAGNOSIS — E538 Deficiency of other specified B group vitamins: Secondary | ICD-10-CM | POA: Diagnosis not present

## 2019-05-09 DIAGNOSIS — E1142 Type 2 diabetes mellitus with diabetic polyneuropathy: Secondary | ICD-10-CM | POA: Diagnosis not present

## 2019-05-09 DIAGNOSIS — E559 Vitamin D deficiency, unspecified: Secondary | ICD-10-CM | POA: Diagnosis not present

## 2019-05-09 DIAGNOSIS — R202 Paresthesia of skin: Secondary | ICD-10-CM | POA: Diagnosis not present

## 2019-05-09 NOTE — Telephone Encounter (Signed)
Requested medication (s) are due for refill today not specified  Requested medication (s) are on the active medication list: {Yes  Last refill: 12/16/2018  Future visit scheduled no  Notes to clinic:Historical provider  Requested Prescriptions  Pending Prescriptions Disp Refills   FARXIGA 10 MG TABS tablet [Pharmacy Med Name: FARXIGA 10 MG TABLET] 30 tablet 0    Sig: TAKE 1 TABLET DAILY.     Endocrinology:  Diabetes - SGLT2 Inhibitors Failed - 05/09/2019  5:18 PM      Failed - HBA1C is between 0 and 7.9 and within 180 days    Hemoglobin A1C  Date Value Ref Range Status  03/10/2016 7.6  Final   HB A1C (BAYER DCA - WAIVED)  Date Value Ref Range Status  06/29/2018 6.6 <7.0 % Final    Comment:                                          Diabetic Adult            <7.0                                       Healthy Adult        4.3 - 5.7                                                           (DCCT/NGSP) American Diabetes Association's Summary of Glycemic Recommendations for Adults with Diabetes: Hemoglobin A1c <7.0%. More stringent glycemic goals (A1c <6.0%) may further reduce complications at the cost of increased risk of hypoglycemia.          Passed - Cr in normal range and within 360 days    Creatinine, Ser  Date Value Ref Range Status  06/29/2018 0.90 0.57 - 1.00 mg/dL Final         Passed - LDL in normal range and within 360 days    LDL Calculated  Date Value Ref Range Status  12/17/2016 Comment 0 - 99 mg/dL Final    Comment:    Triglyceride result indicated is too high for an accurate LDL cholesterol estimation.          Passed - eGFR in normal range and within 360 days    GFR calc Af Amer  Date Value Ref Range Status  06/29/2018 73 >59 mL/min/1.73 Final   GFR calc non Af Amer  Date Value Ref Range Status  06/29/2018 64 >59 mL/min/1.73 Final         Passed - Valid encounter within last 6 months    Recent Outpatient Visits          10 months ago  Essential hypertension   [facility], Jeannette How, MD   10 months ago Chronic right-sided low back pain without sciatica   [place], Megan P, DO   1 year ago Diabetes mellitus without complication [facility])   [facility], Jeannette How, MD   1 year ago Influenza A   [facility] [place], [place]   1 year ago Essential hypertension   [facility], Jeannette How, MD

## 2019-05-16 DIAGNOSIS — E782 Mixed hyperlipidemia: Secondary | ICD-10-CM | POA: Diagnosis not present

## 2019-05-16 DIAGNOSIS — E119 Type 2 diabetes mellitus without complications: Secondary | ICD-10-CM | POA: Diagnosis not present

## 2019-05-16 DIAGNOSIS — E1140 Type 2 diabetes mellitus with diabetic neuropathy, unspecified: Secondary | ICD-10-CM | POA: Diagnosis not present

## 2019-05-16 DIAGNOSIS — E538 Deficiency of other specified B group vitamins: Secondary | ICD-10-CM | POA: Diagnosis not present

## 2019-05-16 DIAGNOSIS — E669 Obesity, unspecified: Secondary | ICD-10-CM | POA: Diagnosis not present

## 2019-05-16 DIAGNOSIS — I10 Essential (primary) hypertension: Secondary | ICD-10-CM | POA: Diagnosis not present

## 2019-05-17 ENCOUNTER — Other Ambulatory Visit: Payer: Self-pay | Admitting: Family Medicine

## 2019-05-17 DIAGNOSIS — E785 Hyperlipidemia, unspecified: Secondary | ICD-10-CM

## 2019-05-18 DIAGNOSIS — G4733 Obstructive sleep apnea (adult) (pediatric): Secondary | ICD-10-CM | POA: Diagnosis not present

## 2019-05-18 MED ORDER — SIMVASTATIN 40 MG PO TABS: 40.0000 mg | ORAL_TABLET | Freq: Every day | ORAL | 0 refills | Status: DC

## 2019-05-18 MED ORDER — PANTOPRAZOLE SODIUM 40 MG PO TBEC: 40.0000 mg | DELAYED_RELEASE_TABLET | Freq: Every day | ORAL | 0 refills | Status: DC

## 2019-05-21 ENCOUNTER — Ambulatory Visit
Admission: RE | Admit: 2019-05-21 | Discharge: 2019-05-21 | Disposition: A | Discharge status: Home / Self Care | Payer: Medicare HMO | Source: Ambulatory Visit | Attending: Neurology | Admitting: Neurology

## 2019-05-21 ENCOUNTER — Ambulatory Visit: Payer: Medicare HMO

## 2019-05-21 ENCOUNTER — Other Ambulatory Visit: Payer: Self-pay

## 2019-05-21 DIAGNOSIS — M2548 Effusion, other site: Secondary | ICD-10-CM | POA: Diagnosis not present

## 2019-05-21 DIAGNOSIS — R200 Anesthesia of skin: Secondary | ICD-10-CM | POA: Diagnosis not present

## 2019-05-21 DIAGNOSIS — H748X3 Other specified disorders of middle ear and mastoid, bilateral: Secondary | ICD-10-CM | POA: Diagnosis not present

## 2019-05-21 LAB — POCT I-STAT CREATININE: Creatinine, Ser: 0.9 mg/dL (ref 0.44–1.00)

## 2019-05-21 MED ORDER — GADOBUTROL 1 MMOL/ML IV SOLN
10.0000 mL | Freq: Once | INTRAVENOUS | Status: AC | PRN
  Administered 2019-05-21: 10 mL via INTRAVENOUS

## 2019-05-23 DIAGNOSIS — R69 Illness, unspecified: Secondary | ICD-10-CM | POA: Diagnosis not present

## 2019-05-23 DIAGNOSIS — E538 Deficiency of other specified B group vitamins: Secondary | ICD-10-CM | POA: Diagnosis not present

## 2019-05-24 DIAGNOSIS — G4733 Obstructive sleep apnea (adult) (pediatric): Secondary | ICD-10-CM | POA: Diagnosis not present

## 2019-05-30 DIAGNOSIS — E538 Deficiency of other specified B group vitamins: Secondary | ICD-10-CM | POA: Diagnosis not present

## 2019-06-01 DIAGNOSIS — H903 Sensorineural hearing loss, bilateral: Secondary | ICD-10-CM | POA: Diagnosis not present

## 2019-06-01 DIAGNOSIS — M792 Neuralgia and neuritis, unspecified: Secondary | ICD-10-CM | POA: Diagnosis not present

## 2019-06-06 DIAGNOSIS — E538 Deficiency of other specified B group vitamins: Secondary | ICD-10-CM | POA: Diagnosis not present

## 2019-06-09 DIAGNOSIS — G4733 Obstructive sleep apnea (adult) (pediatric): Secondary | ICD-10-CM | POA: Diagnosis not present

## 2019-06-13 DIAGNOSIS — E785 Hyperlipidemia, unspecified: Secondary | ICD-10-CM | POA: Diagnosis not present

## 2019-06-13 DIAGNOSIS — M1990 Unspecified osteoarthritis, unspecified site: Secondary | ICD-10-CM | POA: Diagnosis not present

## 2019-06-13 DIAGNOSIS — G4733 Obstructive sleep apnea (adult) (pediatric): Secondary | ICD-10-CM | POA: Diagnosis not present

## 2019-06-13 DIAGNOSIS — G8929 Other chronic pain: Secondary | ICD-10-CM | POA: Diagnosis not present

## 2019-06-13 DIAGNOSIS — Z791 Long term (current) use of non-steroidal anti-inflammatories (NSAID): Secondary | ICD-10-CM | POA: Diagnosis not present

## 2019-06-13 DIAGNOSIS — R32 Unspecified urinary incontinence: Secondary | ICD-10-CM | POA: Diagnosis not present

## 2019-06-13 DIAGNOSIS — E1165 Type 2 diabetes mellitus with hyperglycemia: Secondary | ICD-10-CM | POA: Diagnosis not present

## 2019-06-13 DIAGNOSIS — E1142 Type 2 diabetes mellitus with diabetic polyneuropathy: Secondary | ICD-10-CM | POA: Diagnosis not present

## 2019-06-13 DIAGNOSIS — K219 Gastro-esophageal reflux disease without esophagitis: Secondary | ICD-10-CM | POA: Diagnosis not present

## 2019-06-18 DIAGNOSIS — G4733 Obstructive sleep apnea (adult) (pediatric): Secondary | ICD-10-CM | POA: Diagnosis not present

## 2019-07-04 DIAGNOSIS — E538 Deficiency of other specified B group vitamins: Secondary | ICD-10-CM | POA: Diagnosis not present

## 2019-07-06 ENCOUNTER — Telehealth: Payer: Self-pay | Admitting: Family Medicine

## 2019-07-06 NOTE — Telephone Encounter (Signed)
Routing to provider  

## 2019-07-06 NOTE — Telephone Encounter (Signed)
Followed by endocrinology.  Has not seen Dr. Jeananne Rama in one year.  Needs appointment in office for refills please.  Thank you.

## 2019-07-06 NOTE — Telephone Encounter (Signed)
Medication Refill - Medication: metFORMIN (GLUCOPHAGE) 500 MG tablet  celecoxib (CELEBREX) 200 MG capsule   simvastatin (ZOCOR) 40 MG tablet  pantoprazole (PROTONIX) 40 MG tablet   Has the patient contacted their pharmacy? Yes.   (Agent: If no, request that the patient contact the pharmacy for the refill.) (Agent: If yes, when and what did the pharmacy advise?)  Preferred Pharmacy (with phone number or street name):  [place], [place] Phone:  [phone]  Fax:  [phone]       Agent: Please be advised that RX refills may take up to 3 business days. We ask that you follow-up with your pharmacy.

## 2019-07-07 NOTE — Telephone Encounter (Signed)
Called patient and left detailed message (DPR reviewed) asked patient to return call with any questions

## 2019-07-09 DIAGNOSIS — G4733 Obstructive sleep apnea (adult) (pediatric): Secondary | ICD-10-CM | POA: Diagnosis not present

## 2019-07-12 DIAGNOSIS — R69 Illness, unspecified: Secondary | ICD-10-CM | POA: Diagnosis not present

## 2019-07-13 ENCOUNTER — Encounter: Payer: Self-pay | Admitting: Family Medicine

## 2019-07-13 ENCOUNTER — Other Ambulatory Visit: Payer: Self-pay

## 2019-07-13 ENCOUNTER — Ambulatory Visit (INDEPENDENT_AMBULATORY_CARE_PROVIDER_SITE_OTHER): Payer: Medicare HMO | Admitting: Family Medicine

## 2019-07-13 VITALS — BP 143/67 | HR 93 | Temp 97.8°F | Ht 63.5 in | Wt 221.0 lb

## 2019-07-13 DIAGNOSIS — E785 Hyperlipidemia, unspecified: Secondary | ICD-10-CM

## 2019-07-13 DIAGNOSIS — K219 Gastro-esophageal reflux disease without esophagitis: Secondary | ICD-10-CM | POA: Diagnosis not present

## 2019-07-13 DIAGNOSIS — I10 Essential (primary) hypertension: Secondary | ICD-10-CM

## 2019-07-13 DIAGNOSIS — E119 Type 2 diabetes mellitus without complications: Secondary | ICD-10-CM | POA: Diagnosis not present

## 2019-07-13 DIAGNOSIS — E1149 Type 2 diabetes mellitus with other diabetic neurological complication: Secondary | ICD-10-CM

## 2019-07-13 DIAGNOSIS — E782 Mixed hyperlipidemia: Secondary | ICD-10-CM

## 2019-07-13 DIAGNOSIS — E559 Vitamin D deficiency, unspecified: Secondary | ICD-10-CM

## 2019-07-13 MED ORDER — CELECOXIB 200 MG PO CAPS: ORAL_CAPSULE | ORAL | 1 refills | Status: DC

## 2019-07-13 MED ORDER — SIMVASTATIN 40 MG PO TABS: 40.0000 mg | ORAL_TABLET | Freq: Every day | ORAL | 1 refills | Status: DC

## 2019-07-13 MED ORDER — PANTOPRAZOLE SODIUM 40 MG PO TBEC: 40.0000 mg | DELAYED_RELEASE_TABLET | Freq: Every day | ORAL | 1 refills | Status: DC

## 2019-07-13 NOTE — Progress Notes (Signed)
BP (!) 143/67   Pulse 93   Temp 97.8 F (36.6 C) (Oral)   Ht 5' 3.5" (1.613 m)   Wt 221 lb (100.2 kg)   BMI 38.53 kg/m    Subjective:    Patient ID: Alyssa Mayo, female    DOB: 06-02-1946, 73 y.o.   MRN: [id number]  HPI: Alyssa Mayo is a 73 y.o. female  Chief Complaint  Patient presents with  . Diabetes  . Hyperlipidemia  . Hypertension  . Gastroesophageal Reflux  . Medication Refill    . This visit was completed via WebEx due to the restrictions of the COVID-19 pandemic. All issues as above were discussed and addressed. Physical exam was done as above through visual confirmation on WebEx. If it was felt that the patient should be evaluated in the office, they were directed there. The patient verbally consented to this visit. . Location of the patient: home . Location of the provider: work . Those involved with this call:  . Provider: Merrie Roof, PA-C . CMA: Lesle Chris, [place] . Front Desk/Registration: Jill Side  . Time spent on call: 25 minutes with patient face to face via video conference. More than 50% of this time was spent in counseling and coordination of care. 5 minutes total spent in review of patient's record and preparation of their chart. I verified patient identity using two factors (patient name and date of birth). Patient consents verbally to being seen via telemedicine visit today.   Patient presenting today for 6 month f/u chronic conditions.   HTN - Home BPs typically running well, typically 120s-130s/70s. Taking medications faithfully without side effects. Denies CP, SOB, HAs, dizziness.   DM - not checking home BSs. Trying to eat well and stay active. No low blood sugar spells noted. Takes gabapentin for neuropathy with good relief.   HLD - on simvastatin, tolerating well without myalgias.   GERD - protonix keeping sxs under good control.   Relevant past medical, surgical, family and social history reviewed and updated as indicated.  Interim medical history since our last visit reviewed. Allergies and medications reviewed and updated.  Review of Systems  Per HPI unless specifically indicated above     Objective:    BP (!) 143/67   Pulse 93   Temp 97.8 F (36.6 C) (Oral)   Ht 5' 3.5" (1.613 m)   Wt 221 lb (100.2 kg)   BMI 38.53 kg/m   Wt Readings from Last 3 Encounters:  07/13/19 221 lb (100.2 kg)  02/04/19 220 lb (99.8 kg)  12/16/18 222 lb 12.8 oz (101.1 kg)    Physical Exam Vitals and nursing note reviewed.  Constitutional:      General: She is not in acute distress.    Appearance: Normal appearance.  HENT:     Head: Atraumatic.     Right Ear: External ear normal.     Left Ear: External ear normal.     Nose: Nose normal. No congestion.     Mouth/Throat:     Mouth: Mucous membranes are moist.     Pharynx: Oropharynx is clear. No posterior oropharyngeal erythema.  Eyes:     Extraocular Movements: Extraocular movements intact.     Conjunctiva/sclera: Conjunctivae normal.  Cardiovascular:     Comments: Unable to assess via virtual visit Pulmonary:     Effort: Pulmonary effort is normal. No respiratory distress.  Musculoskeletal:        General: Normal range of motion.  Cervical back: Normal range of motion.  Skin:    General: Skin is dry.     Findings: No erythema.  Neurological:     Mental Status: She is alert and oriented to person, place, and time.  Psychiatric:        Mood and Affect: Mood normal.        Thought Content: Thought content normal.        Judgment: Judgment normal.     Results for orders placed or performed during the hospital encounter of 05/21/19  I-STAT creatinine  Result Value Ref Range   Creatinine, Ser 0.90 0.44 - 1.00 mg/dL      Assessment & Plan:   Problem List Items Addressed This Visit      Cardiovascular and Mediastinum   Essential hypertension    BPs mildly high today, but typically under good control per home readings. She states she's had a  stressful morning and that's why it's high today. Will continue to monitor closely on current regimen. Pt to call with persistent abnormal home readings.       Relevant Medications   simvastatin (ZOCOR) 40 MG tablet   Other Relevant Orders   Comprehensive metabolic panel     Digestive   GERD (gastroesophageal reflux disease)    Stable on protonix, continue current regimen      Relevant Medications   pantoprazole (PROTONIX) 40 MG tablet     Endocrine   DM (diabetes mellitus), type 2 with neurological complications (HCC)    Recheck A1C, adjust as needed. Continue good lifestyle modifications and current regimen      Relevant Medications   simvastatin (ZOCOR) 40 MG tablet   Other Relevant Orders   HgB A1c     Other   Hyperlipidemia - Primary    Recheck lipids, continue current regimen and good diet habits      Relevant Medications   simvastatin (ZOCOR) 40 MG tablet   Other Relevant Orders   Lipid Panel w/o Chol/HDL Ratio out   Vitamin D deficiency    Recheck levels, continue supplementation      Relevant Orders   Vit D  25 hydroxy (rtn osteoporosis monitoring)    Other Visit Diagnoses    Diabetes mellitus without complication (HCC)       Relevant Medications   simvastatin (ZOCOR) 40 MG tablet       Follow up plan: Return in about 6 months (around 01/11/2020) for CPE.

## 2019-07-18 DIAGNOSIS — G4733 Obstructive sleep apnea (adult) (pediatric): Secondary | ICD-10-CM | POA: Diagnosis not present

## 2019-07-20 DIAGNOSIS — K219 Gastro-esophageal reflux disease without esophagitis: Secondary | ICD-10-CM | POA: Insufficient documentation

## 2019-07-20 NOTE — Assessment & Plan Note (Signed)
Recheck levels, continue supplementation 

## 2019-07-20 NOTE — Assessment & Plan Note (Signed)
Stable on protonix, continue current regimen 

## 2019-07-20 NOTE — Assessment & Plan Note (Signed)
Recheck A1C, adjust as needed. Continue good lifestyle modifications and current regimen

## 2019-07-20 NOTE — Assessment & Plan Note (Signed)
BPs mildly high today, but typically under good control per home readings. She states she's had a stressful morning and that's why it's high today. Will continue to monitor closely on current regimen. Pt to call with persistent abnormal home readings.

## 2019-07-20 NOTE — Assessment & Plan Note (Signed)
Recheck lipids, continue current regimen and good diet habits

## 2019-08-18 ENCOUNTER — Other Ambulatory Visit: Payer: Self-pay

## 2019-08-18 DIAGNOSIS — E119 Type 2 diabetes mellitus without complications: Secondary | ICD-10-CM

## 2019-08-18 NOTE — Telephone Encounter (Signed)
Pt declined appt and stated that she would call [facility] endocrinology to get refill.

## 2019-08-18 NOTE — Telephone Encounter (Signed)
Pt called and is out of metFORMIN (GLUCOPHAGE) 500 MG tablet  She stated her other meds were refilled in Dec except for the Metformin/ Pt asked if this can be sent to pharmacy today/ please advise

## 2019-08-18 NOTE — Telephone Encounter (Signed)
Rx request for metFORMIN (GLUCOPHAGE) 500 MG tablet. LOV: 07/13/2019 with Roosvelt Maser, PA-C Next OV: none scheduled.

## 2019-08-19 MED ORDER — METFORMIN HCL 500 MG PO TABS: 1000.0000 mg | ORAL_TABLET | Freq: Two times a day (BID) | ORAL | 1 refills | Status: DC

## 2019-09-13 ENCOUNTER — Other Ambulatory Visit (HOSPITAL_COMMUNITY): Payer: Self-pay | Admitting: Gastroenterology

## 2019-09-13 ENCOUNTER — Other Ambulatory Visit: Payer: Self-pay | Admitting: Gastroenterology

## 2019-09-13 DIAGNOSIS — K7460 Unspecified cirrhosis of liver: Secondary | ICD-10-CM

## 2019-09-15 ENCOUNTER — Ambulatory Visit: Payer: Medicare HMO

## 2019-09-22 ENCOUNTER — Ambulatory Visit
Admission: RE | Admit: 2019-09-22 | Discharge: 2019-09-22 | Disposition: A | Discharge status: Home / Self Care | Payer: Medicare HMO | Source: Ambulatory Visit | Attending: Gastroenterology | Admitting: Gastroenterology

## 2019-09-22 ENCOUNTER — Other Ambulatory Visit: Payer: Self-pay

## 2019-09-22 DIAGNOSIS — K7460 Unspecified cirrhosis of liver: Secondary | ICD-10-CM | POA: Insufficient documentation

## 2019-11-15 ENCOUNTER — Other Ambulatory Visit: Payer: Self-pay | Admitting: Family Medicine

## 2019-11-16 NOTE — Telephone Encounter (Signed)
Not a pt at [facility] please route accordingly

## 2019-11-16 NOTE — Telephone Encounter (Signed)
Upcoming appointment 11/23/19

## 2019-11-23 ENCOUNTER — Ambulatory Visit (INDEPENDENT_AMBULATORY_CARE_PROVIDER_SITE_OTHER): Payer: Medicare HMO | Admitting: Family Medicine

## 2019-11-23 ENCOUNTER — Other Ambulatory Visit: Payer: Self-pay

## 2019-11-23 ENCOUNTER — Encounter: Payer: Self-pay | Admitting: Family Medicine

## 2019-11-23 VITALS — BP 152/83 | HR 83 | Temp 99.0°F | Ht 63.31 in | Wt 226.0 lb

## 2019-11-23 DIAGNOSIS — E1149 Type 2 diabetes mellitus with other diabetic neurological complication: Secondary | ICD-10-CM | POA: Diagnosis not present

## 2019-11-23 DIAGNOSIS — I10 Essential (primary) hypertension: Secondary | ICD-10-CM | POA: Diagnosis not present

## 2019-11-23 DIAGNOSIS — M25511 Pain in right shoulder: Secondary | ICD-10-CM

## 2019-11-23 DIAGNOSIS — E782 Mixed hyperlipidemia: Secondary | ICD-10-CM | POA: Diagnosis not present

## 2019-11-23 DIAGNOSIS — E785 Hyperlipidemia, unspecified: Secondary | ICD-10-CM

## 2019-11-23 DIAGNOSIS — Z1231 Encounter for screening mammogram for malignant neoplasm of breast: Secondary | ICD-10-CM | POA: Diagnosis not present

## 2019-11-23 DIAGNOSIS — K219 Gastro-esophageal reflux disease without esophagitis: Secondary | ICD-10-CM

## 2019-11-23 DIAGNOSIS — Z0000 Encounter for general adult medical examination without abnormal findings: Secondary | ICD-10-CM

## 2019-11-23 DIAGNOSIS — E119 Type 2 diabetes mellitus without complications: Secondary | ICD-10-CM

## 2019-11-23 LAB — UA/M W/RFLX CULTURE, ROUTINE
Bilirubin, UA: NEGATIVE
Ketones, UA: NEGATIVE
Leukocytes,UA: NEGATIVE
Nitrite, UA: NEGATIVE
Protein,UA: NEGATIVE
RBC, UA: NEGATIVE
Specific Gravity, UA: 1.01 (ref 1.005–1.030)
Urobilinogen, Ur: 0.2 mg/dL (ref 0.2–1.0)
pH, UA: 5 (ref 5.0–7.5)

## 2019-11-23 NOTE — Progress Notes (Signed)
BP (!) 152/83   Pulse 83   Temp 99 F (37.2 C)   Ht 5' 3.31" (1.608 m)   Wt 226 lb (102.5 kg)   SpO2 94%   BMI 39.65 kg/m    Subjective:    Patient ID: Alyssa Mayo, female    DOB: 1946/03/26, 74 y.o.   MRN: [id number]  HPI: Alyssa Mayo is a 74 y.o. female presenting on 11/23/2019 for comprehensive medical examination. Current medical complaints include:see below  Right shoulder tingling sensation the past couple weeks, episodes last a few seconds at a time and happens several times per day. Takes low dose gabapentin and celebrex daily. Denies new injury, weakness in extremities, discoloration.   Just got some resistance bands so she can start being more active at home. Does not follow strict diet.   DM - 130s - 160s range typically when checking at home. Taking medicines faithfully without side effects.   HTN - has not been checking home BPs regularly. Takes medicines faithfully, no side effects. Denies CP, SOB, HAs, dizziness. Simvastatin doing well, no claudication, myalgias.   She currently lives with: Menopausal Symptoms: no  Depression Screen done today and results listed below:  Depression screen [facility] 2/9 11/23/2019 12/16/2018 12/10/2017 08/31/2017 04/27/2017  Decreased Interest 0 0 0 0 0  Down, Depressed, Hopeless 0 0 0 0 0  PHQ - 2 Score 0 0 0 0 0    The patient does not have a history of falls. I did complete a risk assessment for falls. A plan of care for falls was documented.   Past Medical History:  Past Medical History:  Diagnosis Date  . Arthritis   . Diabetes mellitus without complication (HCC)   . Elevated liver enzymes   . Fatty liver   . Hyperlipidemia   . Lifelong obesity   . Sleep apnea     Surgical History:  Past Surgical History:  Procedure Laterality Date  . ABDOMINAL HYSTERECTOMY    . APPENDECTOMY    . CERVICAL LAMINECTOMY  1987  . CESAREAN SECTION    . COLONOSCOPY  2014   diverticulosis, [facility] Dr. [name]   . COLONOSCOPY WITH  PROPOFOL N/A 02/04/2019   Procedure: COLONOSCOPY WITH PROPOFOL;  Surgeon: [name], MD;  Location[facility] END[facility]COPY;  Service: Endoscopy;  Laterality: N/A;  . ESOPHAGOGASTRODUODENOSCOPY (EGD) WITH PROPOFOL N/A 02/04/2019   Procedure: ESOPHAGOGASTRODUODENOSCOPY (EGD) WITH PROPOFOL;  Surgeon: [name], MD;  Location: [facility] ENDOSCOPY;  Service: Endoscopy;  Laterality: N/A;  . SPINE SURGERY    . TUBAL LIGATION      Medications:  Current Outpatient Medications on File Prior to Visit  Medication Sig  . aspirin EC 81 MG tablet Take 1 tablet (81 mg total) by mouth daily. Take at least one hour BEFORE the Celebrex  . gabapentin (NEURONTIN) 100 MG capsule Take by mouth daily. At bed time  . glipiZIDE (GLUCOTROL XL) 5 MG 24 hr tablet Take by mouth.  Marland Kitchen glucose blood (ONE TOUCH ULTRA TEST) test strip CHECK BLOOD SUGAR AS DIRECTED.  . Multiple Vitamins-Minerals (PRESERVISION AREDS 2) CAPS Take 2 tablets by mouth daily.   No current facility-administered medications on file prior to visit.    Allergies:  Allergies  Allergen Reactions  . Neosporin [Neomycin-Bacitracin Zn-Polymyx] Hives  . Ozempic [Semaglutide] Hives  . Sulfa Antibiotics Itching    Social History:  Social History   Socioeconomic History  . Marital status: Widowed    Spouse name: Not on file  .  Number of children: Not on file  . Years of education: Not on file  . Highest education level: Bachelor's degree (e.g., BA, AB, BS)  Occupational History  . Not on file  Tobacco Use  . Smoking status: Never Smoker  . Smokeless tobacco: Never Used  Substance and Sexual Activity  . Alcohol use: Yes    Alcohol/week: 0.0 standard drinks    Comment: rare  . Drug use: No  . Sexual activity: Not on file  Other Topics Concern  . Not on file  Social History Narrative  . Not on file   Social Determinants of Health   Financial Resource Strain:   . Difficulty of Paying Living Expenses:   Food Insecurity:   . Worried  About Programme researcher, broadcasting/film/video in the Last Year:   . Barista in the Last Year:   Transportation Needs:   . Freight forwarder (Medical):   Marland Kitchen Lack of Transportation (Non-Medical):   Physical Activity:   . Days of Exercise per Week:   . Minutes of Exercise per Session:   Stress:   . Feeling of Stress :   Social Connections:   . Frequency of Communication with Friends and Family:   . Frequency of Social Gatherings with Friends and Family:   . Attends Religious Services:   . Active Member of Clubs or Organizations:   . Attends Banker Meetings:   Marland Kitchen Marital Status:   Intimate Partner Violence:   . Fear of Current or Ex-Partner:   . Emotionally Abused:   Marland Kitchen Physically Abused:   . Sexually Abused:    Social History   Tobacco Use  Smoking Status Never Smoker  Smokeless Tobacco Never Used   Social History   Substance and Sexual Activity  Alcohol Use Yes  . Alcohol/week: 0.0 standard drinks   Comment: rare    Family History:  Family History  Problem Relation Age of Onset  . Cancer Mother        breast  . Heart disease Mother   . Stroke Mother   . Diabetes Mother   . Cancer Father        leukemia  . Stroke Father   . Cancer Brother   . COPD Neg Hx   . Hypertension Neg Hx     Past medical history, surgical history, medications, allergies, family history and social history reviewed with patient today and changes made to appropriate areas of the chart.   Review of Systems - General ROS: negative Psychological ROS: negative Ophthalmic ROS: negative ENT ROS: negative Allergy and Immunology ROS: negative Hematological and Lymphatic ROS: negative Endocrine ROS: negative Breast ROS: negative for breast lumps Respiratory ROS: no cough, shortness of breath, or wheezing Cardiovascular ROS: no chest pain or dyspnea on exertion Gastrointestinal ROS: no abdominal pain, change in bowel habits, or black or bloody stools Genito-Urinary ROS: no dysuria, trouble  voiding, or hematuria Musculoskeletal ROS: negative Neurological ROS: no TIA or stroke symptoms Dermatological ROS: negative All other ROS negative except what is listed above and in the HPI.      Objective:    BP (!) 152/83   Pulse 83   Temp 99 F (37.2 C)   Ht 5' 3.31" (1.608 m)   Wt 226 lb (102.5 kg)   SpO2 94%   BMI 39.65 kg/m   Wt Readings from Last 3 Encounters:  11/23/19 226 lb (102.5 kg)  07/13/19 221 lb (100.2 kg)  02/04/19 220 lb (99.8 kg)  Physical Exam Vitals and nursing note reviewed.  Constitutional:      General: She is not in acute distress.    Appearance: She is well-developed.  HENT:     Head: Atraumatic.     Right Ear: External ear normal.     Left Ear: External ear normal.     Nose: Nose normal.     Mouth/Throat:     Pharynx: No oropharyngeal exudate.  Eyes:     General: No scleral icterus.    Conjunctiva/sclera: Conjunctivae normal.     Pupils: Pupils are equal, round, and reactive to light.  Neck:     Thyroid: No thyromegaly.  Cardiovascular:     Rate and Rhythm: Normal rate and regular rhythm.     Heart sounds: Normal heart sounds.  Pulmonary:     Effort: Pulmonary effort is normal. No respiratory distress.     Breath sounds: Normal breath sounds.  Chest:     Breasts:        Right: No mass, skin change or tenderness.        Left: No mass, skin change or tenderness.  Abdominal:     General: Bowel sounds are normal.     Palpations: Abdomen is soft. There is no mass.     Tenderness: There is no abdominal tenderness.  Genitourinary:    Comments: GU exam deferred Musculoskeletal:        General: No tenderness. Normal range of motion.     Cervical back: Normal range of motion and neck supple.  Lymphadenopathy:     Cervical: No cervical adenopathy.     Upper Body:     Right upper body: No axillary adenopathy.     Left upper body: No axillary adenopathy.  Skin:    General: Skin is warm and dry.     Findings: No rash.  Neurological:       Mental Status: She is alert and oriented to person, place, and time.     Cranial Nerves: No cranial nerve deficit.  Psychiatric:        Behavior: Behavior normal.     Results for orders placed or performed in visit on 11/23/19  CBC with Differential/Platelet  Result Value Ref Range   WBC 10.0 3.4 - 10.8 x10E3/uL   RBC 4.59 3.77 - 5.28 x10E6/uL   Hemoglobin 12.6 11.1 - 15.9 g/dL   Hematocrit 40.4 34.0 - 46.6 %   MCV 88 79 - 97 fL   MCH 27.5 26.6 - 33.0 pg   MCHC 31.2 (L) 31.5 - 35.7 g/dL   RDW 14.9 11.7 - 15.4 %   Platelets 278 150 - 450 x10E3/uL   Neutrophils 73 Not Estab. %   Lymphs 18 Not Estab. %   Monocytes 6 Not Estab. %   Eos 2 Not Estab. %   Basos 1 Not Estab. %   Neutrophils Absolute 7.4 (H) 1.4 - 7.0 x10E3/uL   Lymphocytes Absolute 1.8 0.7 - 3.1 x10E3/uL   Monocytes Absolute 0.6 0.1 - 0.9 x10E3/uL   EOS (ABSOLUTE) 0.2 0.0 - 0.4 x10E3/uL   Basophils Absolute 0.1 0.0 - 0.2 x10E3/uL   Immature Granulocytes 0 Not Estab. %   Immature Grans (Abs) 0.0 0.0 - 0.1 x10E3/uL  Comprehensive metabolic panel  Result Value Ref Range   Glucose 118 (H) 65 - 99 mg/dL   BUN 18 8 - 27 mg/dL   Creatinine, Ser 0.88 0.57 - 1.00 mg/dL   GFR calc non Af Amer 64 >59 mL/min/1.73  GFR calc Af Amer 74 >59 mL/min/1.73   BUN/Creatinine Ratio 20 12 - 28   Sodium 143 134 - 144 mmol/L   Potassium 4.9 3.5 - 5.2 mmol/L   Chloride 105 96 - 106 mmol/L   CO2 22 20 - 29 mmol/L   Calcium 9.5 8.7 - 10.3 mg/dL   Total Protein 6.6 6.0 - 8.5 g/dL   Albumin 4.2 3.7 - 4.7 g/dL   Globulin, Total 2.4 1.5 - 4.5 g/dL   Albumin/Globulin Ratio 1.8 1.2 - 2.2   Bilirubin Total 0.2 0.0 - 1.2 mg/dL   Alkaline Phosphatase 106 39 - 117 IU/L   AST 15 0 - 40 IU/L   ALT 14 0 - 32 IU/L  Lipid Panel w/o Chol/HDL Ratio  Result Value Ref Range   Cholesterol, Total 162 100 - 199 mg/dL   Triglycerides 182 (H) 0 - 149 mg/dL   HDL 42 >99 mg/dL   VLDL Cholesterol Cal 42 (H) 5 - 40 mg/dL   LDL Chol Calc (NIH) 78 0 -  99 mg/dL  UA/M w/rflx Culture, Routine   Specimen: Urine   URINE  Result Value Ref Range   Specific Gravity, UA 1.010 1.005 - 1.030   pH, UA 5.0 5.0 - 7.5   Color, UA Yellow Yellow   Appearance Ur Clear Clear   Leukocytes,UA Negative Negative   Protein,UA Negative Negative/Trace   Glucose, UA 3+ (A) Negative   Ketones, UA Negative Negative   RBC, UA Negative Negative   Bilirubin, UA Negative Negative   Urobilinogen, Ur 0.2 0.2 - 1.0 mg/dL   Nitrite, UA Negative Negative  TSH  Result Value Ref Range   TSH 1.620 0.450 - 4.500 uIU/mL  HgB A1c  Result Value Ref Range   Hgb A1c MFr Bld 7.3 (H) 4.8 - 5.6 %   Est. average glucose Bld gHb Est-mCnc 163 mg/dL      Assessment & Plan:   Problem List Items Addressed This Visit      Cardiovascular and Mediastinum   Essential hypertension - Primary    BPs stable and well controlled, continue current regimen      Relevant Medications   simvastatin (ZOCOR) 40 MG tablet   Other Relevant Orders   CBC with Differential/Platelet (Completed)   Comprehensive metabolic panel (Completed)   TSH (Completed)     Digestive   GERD (gastroesophageal reflux disease)    Stable and under good control, continue current regimen      Relevant Medications   pantoprazole (PROTONIX) 40 MG tablet     Endocrine   DM (diabetes mellitus), type 2 with neurological complications (HCC)    Recheck A1C, adjust as needed. Continue current regimen and working on lifestyle modifications      Relevant Medications   dapagliflozin propanediol (FARXIGA) 10 MG TABS tablet   metFORMIN (GLUCOPHAGE) 500 MG tablet   simvastatin (ZOCOR) 40 MG tablet   Other Relevant Orders   UA/M w/rflx Culture, Routine (Completed)   HgB A1c (Completed)     Other   Hyperlipidemia    Recheck lipids, adjust as needed. Continue current regimen      Relevant Medications   simvastatin (ZOCOR) 40 MG tablet   Other Relevant Orders   Lipid Panel w/o Chol/HDL Ratio (Completed)      Other Visit Diagnoses    Annual physical exam       Diabetes mellitus without complication (HCC)       Relevant Medications   dapagliflozin propanediol (FARXIGA) 10 MG TABS  tablet   metFORMIN (GLUCOPHAGE) 500 MG tablet   simvastatin (ZOCOR) 40 MG tablet   Encounter for screening mammogram for malignant neoplasm of breast       Relevant Orders   MM Digital Diagnostic Bilat   Acute pain of right shoulder       Suspect inflammatory/arthritic - continue celebrex, stretches, heat/ice prn. May need imaging if not improving       Follow up plan: Return in about 6 months (around 05/25/2020) for 6 month f/u.   LABORATORY TESTING:  - Pap smear: not applicable  IMMUNIZATIONS:   - Tdap: Tetanus vaccination status reviewed: last tetanus booster within 10 years. - Influenza: Postponed to flu season - Pneumovax: Up to date - Prevnar: Up to date - HPV: Not applicable - Zostavax vaccine: Up to date  SCREENING: -Mammogram: Ordered today  - Colonoscopy: Up to date  - Bone Density: Up to date   PATIENT COUNSELING:   Advised to take 1 mg of folate supplement per day if capable of pregnancy.   Sexuality: Discussed sexually transmitted diseases, partner selection, use of condoms, avoidance of unintended pregnancy  and contraceptive alternatives.   Advised to avoid cigarette smoking.  I discussed with the patient that most people either abstain from alcohol or drink within safe limits (<=14/week and <=4 drinks/occasion for males, <=7/weeks and <= 3 drinks/occasion for females) and that the risk for alcohol disorders and other health effects rises proportionally with the number of drinks per week and how often a drinker exceeds daily limits.  Discussed cessation/primary prevention of drug use and availability of treatment for abuse.   Diet: Encouraged to adjust caloric intake to maintain  or achieve ideal body weight, to reduce intake of dietary saturated fat and total fat, to limit sodium  intake by avoiding high sodium foods and not adding table salt, and to maintain adequate dietary potassium and calcium preferably from fresh fruits, vegetables, and low-fat dairy products.    stressed the importance of regular exercise  Injury prevention: Discussed safety belts, safety helmets, smoke detector, smoking near bedding or upholstery.   Dental health: Discussed importance of regular tooth brushing, flossing, and dental visits.    NEXT PREVENTATIVE PHYSICAL DUE IN 1 YEAR. Return in about 6 months (around 05/25/2020) for 6 month f/u.

## 2019-11-24 LAB — CBC WITH DIFFERENTIAL/PLATELET
Basophils Absolute: 0.1 10*3/uL (ref 0.0–0.2)
Basos: 1 %
EOS (ABSOLUTE): 0.2 10*3/uL (ref 0.0–0.4)
Eos: 2 %
Hematocrit: 40.4 % (ref 34.0–46.6)
Hemoglobin: 12.6 g/dL (ref 11.1–15.9)
Immature Grans (Abs): 0 10*3/uL (ref 0.0–0.1)
Immature Granulocytes: 0 %
Lymphocytes Absolute: 1.8 10*3/uL (ref 0.7–3.1)
Lymphs: 18 %
MCH: 27.5 pg (ref 26.6–33.0)
MCHC: 31.2 g/dL — ABNORMAL LOW (ref 31.5–35.7)
MCV: 88 fL (ref 79–97)
Monocytes Absolute: 0.6 10*3/uL (ref 0.1–0.9)
Monocytes: 6 %
Neutrophils Absolute: 7.4 10*3/uL — ABNORMAL HIGH (ref 1.4–7.0)
Neutrophils: 73 %
Platelets: 278 10*3/uL (ref 150–450)
RBC: 4.59 x10E6/uL (ref 3.77–5.28)
RDW: 14.9 % (ref 11.7–15.4)
WBC: 10 10*3/uL (ref 3.4–10.8)

## 2019-11-24 LAB — COMPREHENSIVE METABOLIC PANEL
ALT: 14 IU/L (ref 0–32)
AST: 15 IU/L (ref 0–40)
Albumin/Globulin Ratio: 1.8 (ref 1.2–2.2)
Albumin: 4.2 g/dL (ref 3.7–4.7)
Alkaline Phosphatase: 106 IU/L (ref 39–117)
BUN/Creatinine Ratio: 20 (ref 12–28)
BUN: 18 mg/dL (ref 8–27)
Bilirubin Total: 0.2 mg/dL (ref 0.0–1.2)
CO2: 22 mmol/L (ref 20–29)
Calcium: 9.5 mg/dL (ref 8.7–10.3)
Chloride: 105 mmol/L (ref 96–106)
Creatinine, Ser: 0.88 mg/dL (ref 0.57–1.00)
GFR calc Af Amer: 74 mL/min/{1.73_m2} (ref >59)
GFR calc non Af Amer: 64 mL/min/{1.73_m2} (ref >59)
Globulin, Total: 2.4 g/dL (ref 1.5–4.5)
Glucose: 118 mg/dL — ABNORMAL HIGH (ref 65–99)
Potassium: 4.9 mmol/L (ref 3.5–5.2)
Sodium: 143 mmol/L (ref 134–144)
Total Protein: 6.6 g/dL (ref 6.0–8.5)

## 2019-11-24 LAB — HEMOGLOBIN A1C
Est. average glucose Bld gHb Est-mCnc: 163 mg/dL
Hgb A1c MFr Bld: 7.3 % — ABNORMAL HIGH (ref 4.8–5.6)

## 2019-11-24 LAB — TSH: TSH: 1.62 u[IU]/mL (ref 0.450–4.500)

## 2019-11-24 LAB — LIPID PANEL W/O CHOL/HDL RATIO
Cholesterol, Total: 162 mg/dL (ref 100–199)
HDL: 42 mg/dL (ref >39)
LDL Chol Calc (NIH): 78 mg/dL (ref 0–99)
Triglycerides: 258 mg/dL — ABNORMAL HIGH (ref 0–149)
VLDL Cholesterol Cal: 42 mg/dL — ABNORMAL HIGH (ref 5–40)

## 2019-12-04 MED ORDER — FARXIGA 10 MG PO TABS: 10.0000 mg | ORAL_TABLET | Freq: Every day | ORAL | 1 refills | Status: AC

## 2019-12-04 MED ORDER — SIMVASTATIN 40 MG PO TABS: 40.0000 mg | ORAL_TABLET | Freq: Every day | ORAL | 1 refills | Status: DC

## 2019-12-04 MED ORDER — CELECOXIB 200 MG PO CAPS: ORAL_CAPSULE | ORAL | 1 refills | Status: DC

## 2019-12-04 MED ORDER — PANTOPRAZOLE SODIUM 40 MG PO TBEC: 40.0000 mg | DELAYED_RELEASE_TABLET | Freq: Every day | ORAL | 1 refills | Status: DC

## 2019-12-04 MED ORDER — METFORMIN HCL 500 MG PO TABS: 1000.0000 mg | ORAL_TABLET | Freq: Two times a day (BID) | ORAL | 1 refills | Status: DC

## 2019-12-04 NOTE — Assessment & Plan Note (Signed)
Recheck A1C, adjust as needed. Continue current regimen and working on lifestyle modifications 

## 2019-12-04 NOTE — Assessment & Plan Note (Signed)
Stable and under good control, continue current regimen 

## 2019-12-04 NOTE — Assessment & Plan Note (Signed)
Recheck lipids, adjust as needed. Continue current regimen 

## 2019-12-04 NOTE — Assessment & Plan Note (Signed)
BPs stable and well controlled, continue current regimen 

## 2020-01-18 IMAGING — MR MR HEAD WO/W CM
16 of 21 series · 36 of 48 positions shown · IV contrast (gadavist)
Comparison: CT head without contrast 06/26/2014. MR head without
and with contrast 04/22/2010.

CLINICAL DATA: Persistent tongue numbness following endoscopy for
four months ago. Patient had left-sided jaw pain from the same time
which has recently resolved.

EXAM:
MRI HEAD AND FACE WITHOUT AND WITH CONTRAST
TECHNIQUE: Multiplanar, multiecho pulse sequences of the brain and surrounding
structures were obtained without and with intravenous contrast.
Multiplanar, multiecho pulse sequences of the skull base and
surrounding structures were obtained including fat saturation
techniques, before and after intravenous contrast administration.
CONTRAST:  10mL GADAVIST GADOBUTROL 1 MMOL/ML IV SOLN

[Series 5: T1 · sagittal · 5.0mm · 0.62mm/px · 2 of 25 slices shown (1 of 4)]
[im 1/25]
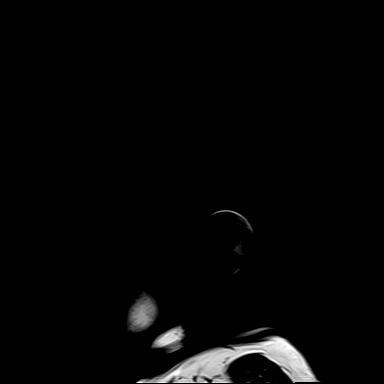
[im 25/25]
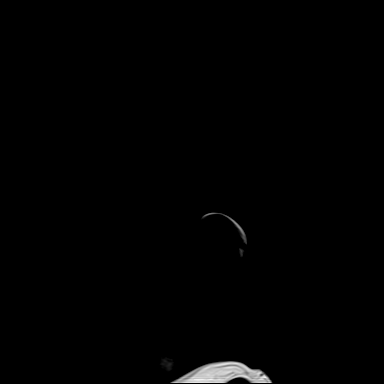

[Series 6: ax dwi_tracew · axial · 3.0mm · 0.60mm/px · z∈[-12,+143]mm · 3 of 48 slices shown]
[im 1/48]
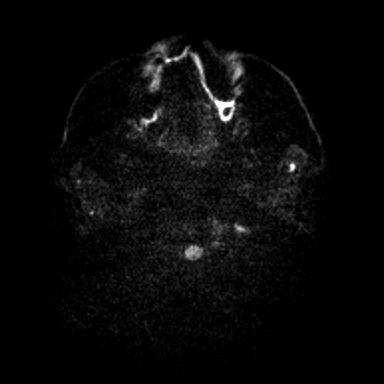
[im 24/48]
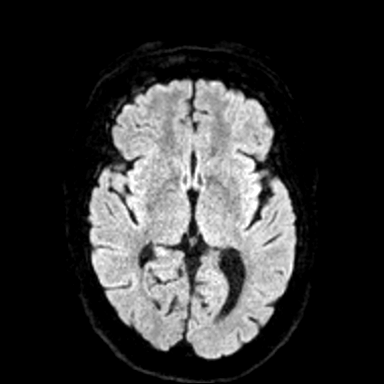
[im 48/48]
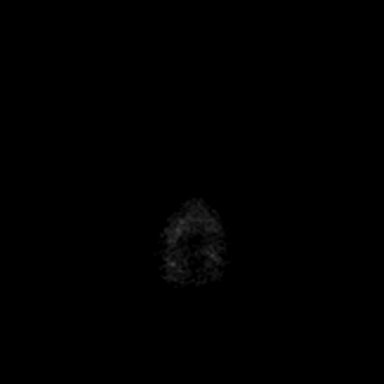

[Series 7: ax dwi_adc · axial · 3.0mm · 0.60mm/px · z∈[-12,+136]mm · 2 of 46 slices shown]
[im 1/46]
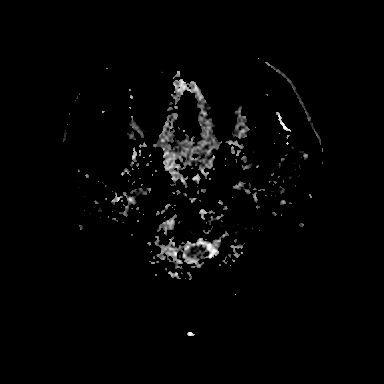
[im 46/46]
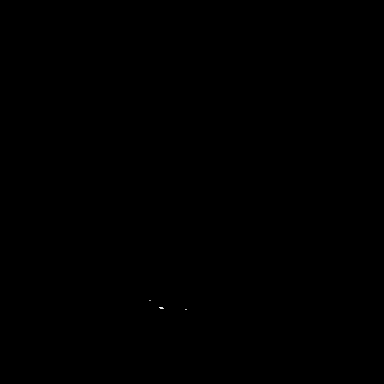

[Series 8: cor dwi_tracew · coronal · 5.0mm · 0.60mm/px · 1 of 38 slices shown]
[im 1/38]
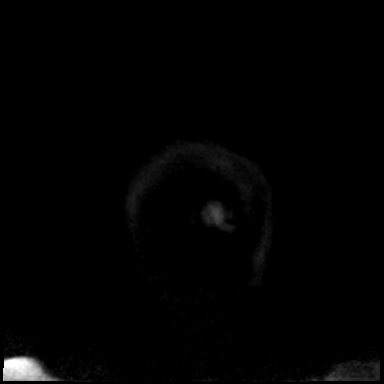

[Series 10: T2 · axial · 5.0mm · 0.53mm/px · 1 of 27 slices shown (1 of 2)]
[im 1/27]
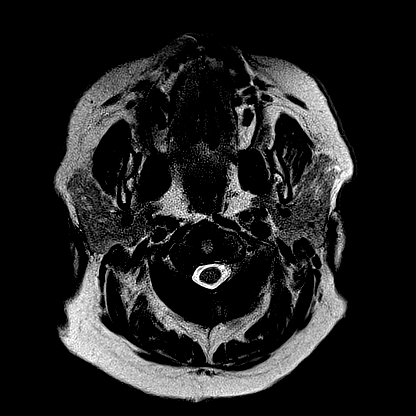

[Series 15: FLAIR · axial · 3.0mm · 0.53mm/px · z∈[-16,+146]mm · 2 of 55 slices shown]
[im 1/55]
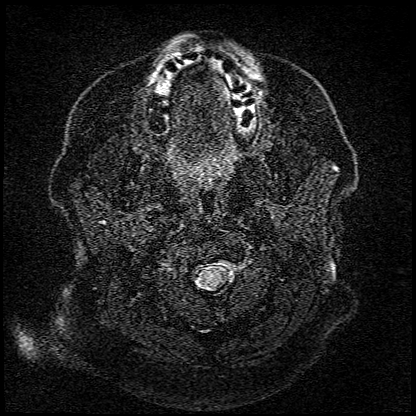
[im 55/55]
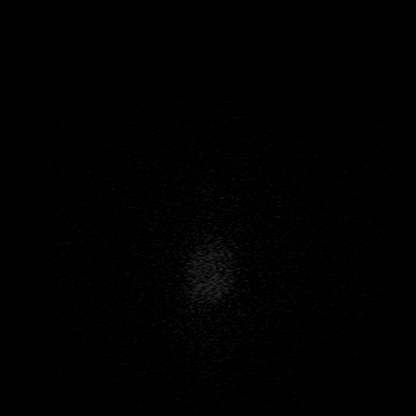

[Series 16: T2 · coronal · 3.0mm · 0.70mm/px · 2 of 40 slices shown (2 of 2)]
[im 1/40]
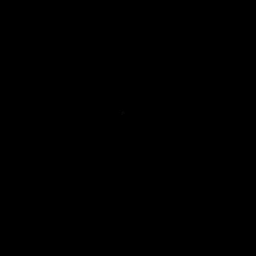
[im 40/40]
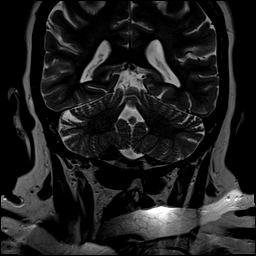

[Series 17: STIR · axial · 3.0mm · 0.70mm/px · 1 of 36 slices shown]
[im 1/36]
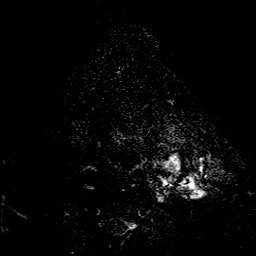

[Series 19: T1 · axial · 3.0mm · 0.35mm/px · 1 of 36 slices shown (2 of 4)]
[im 1/36]
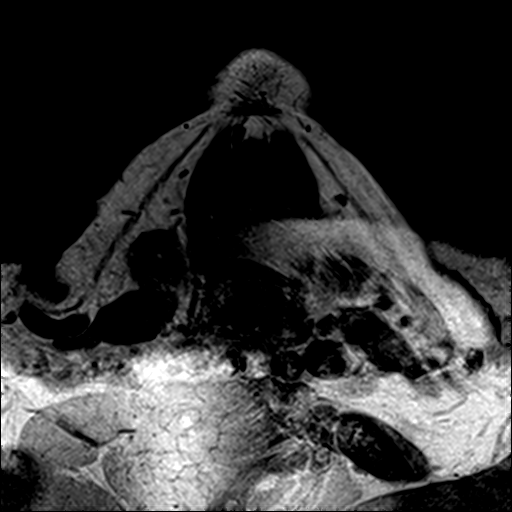

[Series 20: T1 · coronal · 3.0mm · 0.35mm/px · 2 of 40 slices shown (3 of 4)]
[im 1/40]
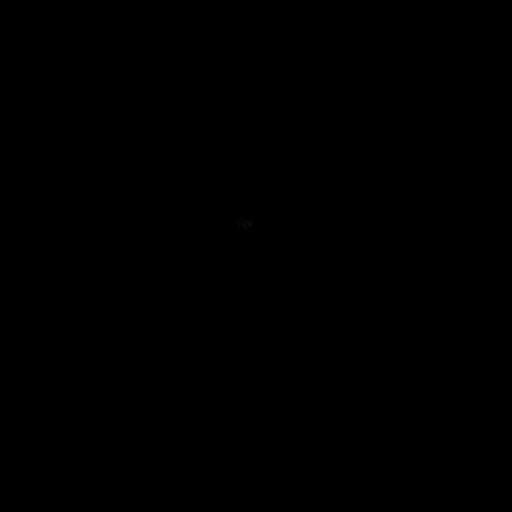
[im 40/40]
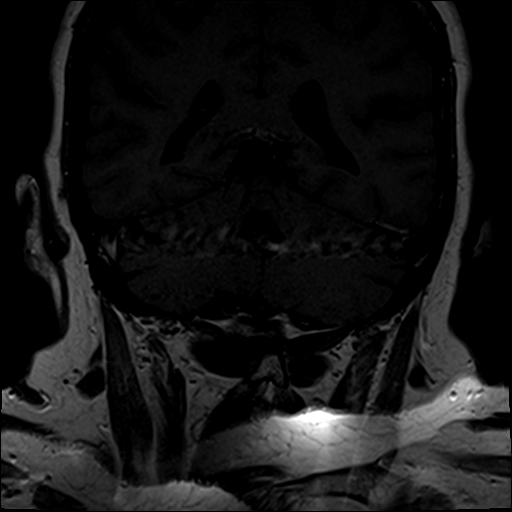

[Series 21: T1 · axial · 1.0mm · 0.98mm/px · z∈[-27,+148]mm · 7 of 176 slices shown (4 of 4)]
[im 1/176]
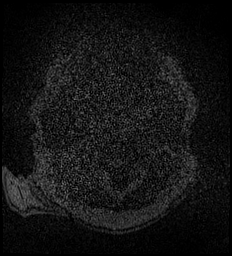
[im 30/176]
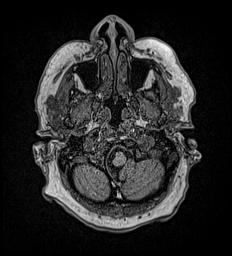
[im 59/176]
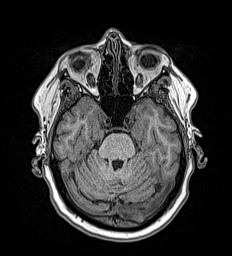
[im 88/176]
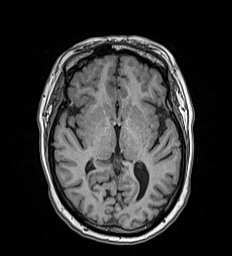
[im 117/176]
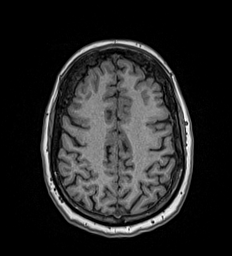
[im 146/176]
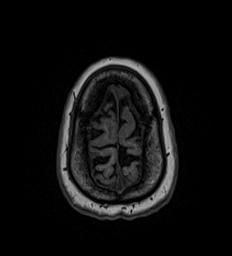
[im 176/176]
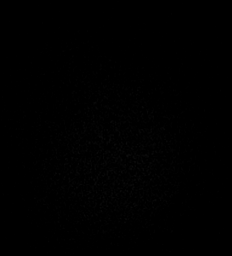

[Series 22: T2 post-contrast · coronal · 5.0mm · 0.57mm/px · 1 of 29 slices shown]
[im 1/29]
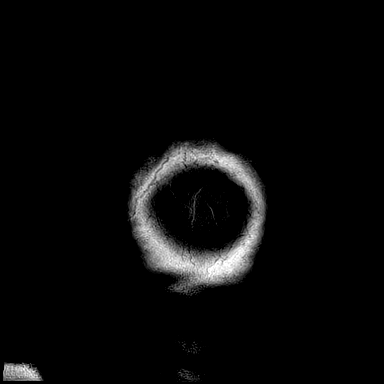

[Series 23: T1 fat-sat post-contrast · axial · 3.0mm · 0.35mm/px · 1 of 36 slices shown (1 of 2)]
[im 1/36]
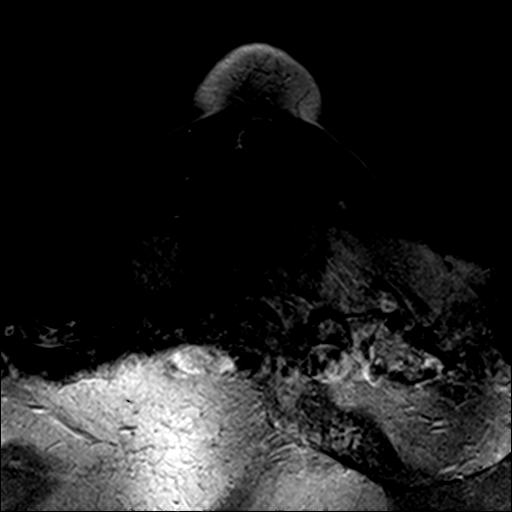

[Series 24: T1 post-contrast · axial · 1.0mm · 0.98mm/px · z∈[-27,+148]mm · 7 of 176 slices shown (1 of 2)]
[im 1/176]
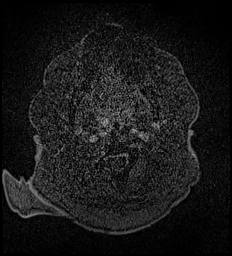
[im 30/176]
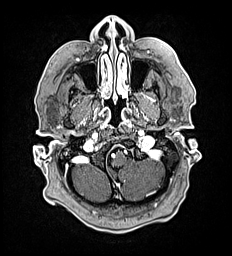
[im 59/176]
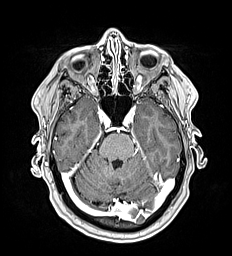
[im 88/176]
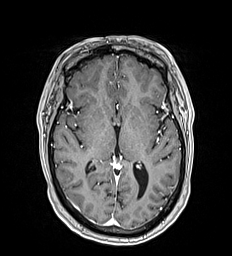
[im 117/176]
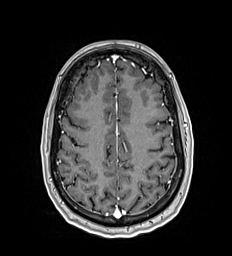
[im 146/176]
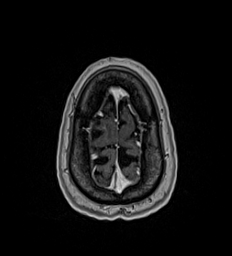
[im 176/176]
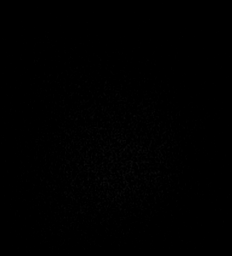

[Series 25: T1 fat-sat post-contrast · coronal · 3.0mm · 0.35mm/px · 2 of 39 slices shown (2 of 2)]
[im 1/39]
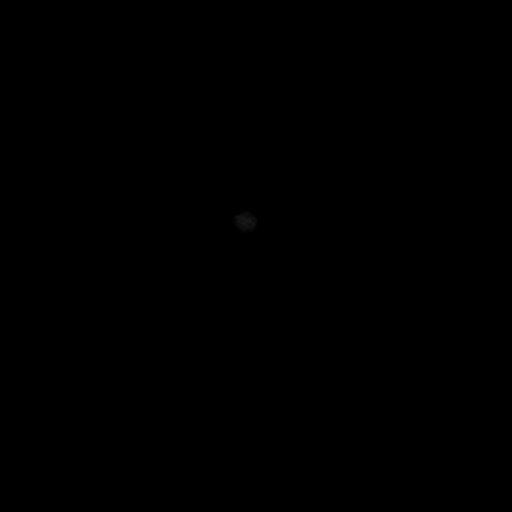
[im 39/39]
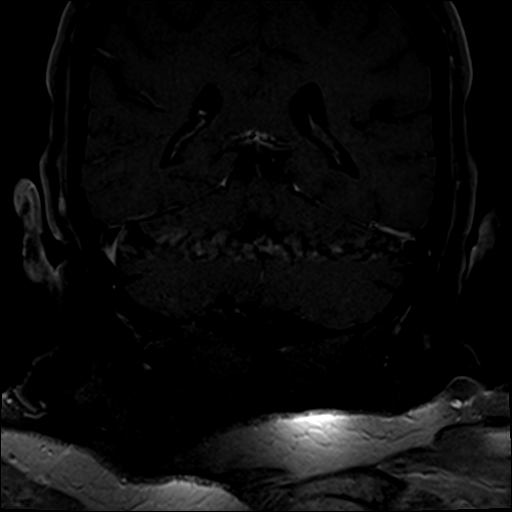

[Series 26: T1 post-contrast · coronal · 5.0mm · 0.57mm/px · 1 of 29 slices shown (2 of 2)]
[im 1/29]
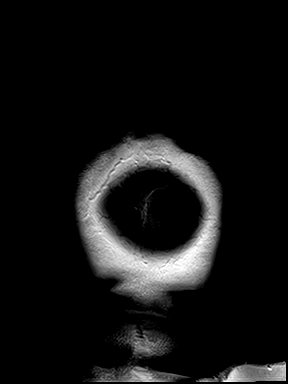

[36 of 48 positions shown; findings below may reference images not displayed]

FINDINGS: MRI HEAD FINDINGS

Brain: No acute infarct, hemorrhage, or mass lesion is present. The
ventricles are of normal size. No significant extraaxial fluid
collection is present.

Cerebellum is unremarkable. No significant white matter disease is
present.

Postcontrast images demonstrate no pathologic enhancement.

Vascular: Flow is present in the major intracranial arteries.

Skull and upper cervical spine: The craniocervical junction is
normal. Upper cervical spine is within normal limits. Marrow signal
is unremarkable.

MRI FACE FINDINGS

Dedicated imaging of the skull base and cranial nerves demonstrates
normal appearance of the cavernous sinus. Pathways of the fifth
cranial nerve are unremarkable. Fifth cranial nerve root entry zone
in the brainstem is normal. No focal brainstem lesions are present.

Tongue musculature is symmetric and within normal limits. The
mandible is unremarkable. Muscles of mastication are within normal
limits.

Bilateral mastoid effusions are present. There is some enhancement
associated with the right mastoid effusion. No obstructing
nasopharyngeal lesion or mass lesion is present.
IMPRESSION: 1. No acute or focal lesion to explain left-sided tongue numbness.
2. Skull base and visualized cranial nerves are unremarkable.
3. Bilateral mastoid effusions without obstructing nasopharyngeal
lesion. There is enhancement of the right mastoid effusion raising
the possibility of infection.

## 2020-02-14 LAB — HM DIABETES EYE EXAM

## 2020-03-27 ENCOUNTER — Other Ambulatory Visit: Payer: Self-pay | Admitting: Family Medicine

## 2020-03-27 NOTE — Telephone Encounter (Signed)
Requested medication (s) are due for refill today:yes  Requested medication (s) are on the active medication list: yes  Last refill:  12/04/19  #90 1 refill by Merrie Roof  Future visit scheduled: yes  with Kathrine Haddock  Notes to clinic:  Patient has appointment scheduled with Kathrine Haddock NP  I don't see that Malachy Mood has seen her in the past.    Requested Prescriptions  Pending Prescriptions Disp Refills   FARXIGA 10 MG TABS tablet [Pharmacy Med Name: FARXIGA 10 MG TABLET] 30 tablet 0    Sig: Take 10 mg by mouth daily.      Endocrinology:  Diabetes - SGLT2 Inhibitors Failed - 03/27/2020  5:04 PM      Failed - LDL in normal range and within 360 days    LDL Chol Calc (NIH)  Date Value Ref Range Status  11/23/2019 78 0 - 99 mg/dL Final          Passed - Cr in normal range and within 360 days    Creatinine, Ser  Date Value Ref Range Status  11/23/2019 0.88 0.57 - 1.00 mg/dL Final          Passed - HBA1C is between 0 and 7.9 and within 180 days    Hemoglobin A1C  Date Value Ref Range Status  03/10/2016 7.6  Final   HB A1C (BAYER DCA - WAIVED)  Date Value Ref Range Status  06/29/2018 6.6 <7.0 % Final    Comment:                                          Diabetic Adult            <7.0                                       Healthy Adult        4.3 - 5.7                                                           (DCCT/NGSP) American Diabetes Association's Summary of Glycemic Recommendations for Adults with Diabetes: Hemoglobin A1c <7.0%. More stringent glycemic goals (A1c <6.0%) may further reduce complications at the cost of increased risk of hypoglycemia.    Hgb A1c MFr Bld  Date Value Ref Range Status  11/23/2019 7.3 (H) 4.8 - 5.6 % Final    Comment:             Prediabetes: 5.7 - 6.4          Diabetes: >6.4          Glycemic control for adults with diabetes: <7.0           Passed - eGFR in normal range and within 360 days    GFR calc Af Amer  Date Value Ref  Range Status  11/23/2019 74 >59 mL/min/1.73 Final    Comment:    **[facility] currently reports eGFR in compliance with the current**   recommendations of the [employer]. [facility] will   update reporting as new guidelines are published from the NKF-ASN   Task  force.    GFR calc non Af Amer  Date Value Ref Range Status  11/23/2019 64 >59 mL/min/1.73 Final          Passed - Valid encounter within last 6 months    Recent Outpatient Visits           4 months ago Essential hypertension   [facility] Merrie Roof [place], [place]   8 months ago Mixed hyperlipidemia   [place], Lilia Argue, [place]   1 year ago Essential hypertension   [place], Jeannette How, [place]   1 year ago Chronic right-sided low back pain without sciatica   [place], Megan P, DO   2 years ago Diabetes mellitus without complication [facility])   [place], [place]       Future Appointments             In 2 months Kathrine Haddock, NP [facility], [place]

## 2020-03-28 NOTE — Telephone Encounter (Signed)
Called pharmacy and confirmed that patient still has 90 days worth of medication. Only gets 30 at a time due to copay per the pharmacist. 30 tablets currently ready for pick up.  Called and notified patient of this as well.

## 2020-03-28 NOTE — Telephone Encounter (Signed)
Refused by: Pablo Ledger, CMA   Refusal reason: Patient has requested refill too soon (should not be due until mid November)

## 2020-03-28 NOTE — Telephone Encounter (Signed)
Pt stated she is completely out of medication and would like to speak to clinical staff.

## 2020-05-31 ENCOUNTER — Ambulatory Visit: Payer: Medicare HMO | Admitting: Family Medicine

## 2020-05-31 ENCOUNTER — Ambulatory Visit: Payer: Medicare HMO | Admitting: Unknown Physician Specialty

## 2020-06-05 ENCOUNTER — Other Ambulatory Visit: Payer: Self-pay | Admitting: Gastroenterology

## 2020-06-05 DIAGNOSIS — K7469 Other cirrhosis of liver: Secondary | ICD-10-CM

## 2020-06-08 ENCOUNTER — Ambulatory Visit
Admission: RE | Admit: 2020-06-08 | Discharge: 2020-06-08 | Disposition: A | Discharge status: Home / Self Care | Payer: Medicare HMO | Source: Ambulatory Visit | Attending: Gastroenterology | Admitting: Gastroenterology

## 2020-06-08 ENCOUNTER — Other Ambulatory Visit: Payer: Self-pay

## 2020-06-08 DIAGNOSIS — K7469 Other cirrhosis of liver: Secondary | ICD-10-CM | POA: Diagnosis not present

## 2020-07-03 ENCOUNTER — Other Ambulatory Visit: Payer: Self-pay | Admitting: Family Medicine

## 2020-08-15 ENCOUNTER — Other Ambulatory Visit: Payer: Self-pay | Admitting: Family Medicine

## 2020-08-16 ENCOUNTER — Encounter: Payer: Medicare HMO | Attending: Gastroenterology | Admitting: Dietician

## 2020-08-16 ENCOUNTER — Other Ambulatory Visit: Payer: Self-pay

## 2020-08-16 ENCOUNTER — Encounter: Payer: Self-pay | Admitting: Dietician

## 2020-08-16 VITALS — Ht 63.0 in | Wt 222.6 lb

## 2020-08-16 DIAGNOSIS — K760 Fatty (change of) liver, not elsewhere classified: Secondary | ICD-10-CM | POA: Diagnosis not present

## 2020-08-16 DIAGNOSIS — E119 Type 2 diabetes mellitus without complications: Secondary | ICD-10-CM | POA: Diagnosis present

## 2020-08-16 DIAGNOSIS — E8801 Alpha-1-antitrypsin deficiency: Secondary | ICD-10-CM | POA: Insufficient documentation

## 2020-08-16 DIAGNOSIS — K7469 Other cirrhosis of liver: Secondary | ICD-10-CM | POA: Insufficient documentation

## 2020-08-16 DIAGNOSIS — E1149 Type 2 diabetes mellitus with other diabetic neurological complication: Secondary | ICD-10-CM

## 2020-08-16 NOTE — Telephone Encounter (Signed)
Requested medication (s) are due for refill today:   Not sure  Requested medication (s) are on the active medication list:   Yes  Future visit scheduled:   No   Last ordered: 04/09/2018 #100, 12 refills  Clinic Note:   It looks like she may be seeing Dr. [name] at the [facility] in [place] for her PCP.   This rx is 75 yrs old.   Returned to provider for further disposition.  Requested Prescriptions  Pending Prescriptions Disp Refills   ONETOUCH ULTRA test strip [Pharmacy Med Name: ONETOUCH ULTRA BLUE TEST STRP] 50 strip 0    Sig: CHECK BLOOD SUGAR AS DIRECTED.      Endocrinology: Diabetes - Testing Supplies Passed - 08/15/2020  5:11 PM      Passed - Valid encounter within last 12 months    Recent Outpatient Visits           8 months ago Essential hypertension   [facility] Particia Nearing, [place]   1 year ago Mixed hyperlipidemia   [facility] Particia Nearing, [place]   2 years ago Essential hypertension   [facility] [place], Redge Gainer, [place]   2 years ago Chronic right-sided low back pain without sciatica   [facility] [employer], Megan P, DO   2 years ago Diabetes mellitus without complication [facility])   [facility] [place], Redge Gainer, [place]

## 2020-08-16 NOTE — Patient Instructions (Signed)
   Increase vegetables; plan to have one or more servings with each meal, or for a snack ie celery with peanut butter, tomato salsa with baked tortilla chips (just a few), carrots with hummus.   Keep good control of starchy foods to help with blood sugar and reduce fat deposits in the liver.   When eating cereal, keep to a small portion (1 cup or less) and try a whole grain option, such as wheaties or oatmeal squares or total -- add a few nuts and fruit for a balanced meal.   Choose other whole grains like 100% whole grain, or sprouted grain bread, thin sliced bread or pita can further reduce starch and calories.  Continue with regular exercise/ activity; increase frequency or duration as able.

## 2020-08-16 NOTE — Progress Notes (Signed)
Medical Nutrition Therapy: Visit start time: 1000  end time: 1100  Assessment:  Diagnosis: non-acloholic fatty liver disease Past medical history: Type 2 diabetes, hyperlipidemia, GERD, sleep apnea, diverticulosis, obesity Psychosocial issues/ stress concerns: none  Preferred learning method:  . Auditory . Hands-on   Current weight: 222.6lbs Height: 5'3" BMI 39.43 Medications, supplements: reconciled list in medical record  Progress and evaluation:   Patient reports recent increase in BGs since 07/21/20, not completely sure why. She has been working on controlling carbohydrate intake.   Tests fasting BGs daily and occasionally later in the day.  She reports she has started eating beets after hearing they are good for the liver.  She would like to lose weight to improve health; her personal goal would be about 140lbs, was last at that weight at age 56.    Physical activity: walking, stretches 15-30 minutes, 2-3 times a week (PT stretches for back)  Dietary Intake:  Usual eating pattern includes 2-3 meals and 1-2 snacks per day. Dining out frequency: 3 meals per week.  Breakfast: toast occasionally with boiled egg; cornflakes + apple; rarely bacon and eggs about 1x every 2 weeks; drinks coffee Snack: usually none; tries to keep snack foods out of the house Lunch: sandwich; sometimes skips if breakfast was a large meal Snack: sometimes popcorn; rarely chocolate Supper: chicken/ fish/ occ meatballs or meatloaf + veg ie frozen collards/ broccoli/ green beans/ corn + occ pasta or rice maybe 1-2x a month, potatoes or bread more often about 2x a week Snack: occ popcorn Beverages: water, milk, coffee in am only, low sugar apple juice, decaf unsweetened tea   Nutrition Care Education: Topics covered:  Basic nutrition: basic food groups, appropriate nutrient balance, appropriate meal and snack schedule, general nutrition guidelines    Weight control: importance of low sugar and low fat  choices, appropriate food portion controls; benefits of even small amount of weight loss on liver health and diabetes with goal of losing 5-10% of body weight to see improvement. Fatty liver disease: low-moderate fat intake; limiting portions of starchy and sugary foods and choosing unrefined, whole grains; Mediterranean diet  Diabetes:  appropriate meal and snack schedule, appropriate carb intake and balance, healthy carb choices, role of fiber, protein, fat Other lifestyle changes:  benefits of regular exercise for liver health and diabetes;   Nutritional Diagnosis:  Joyce-2.2 Altered nutrition-related laboratory As related to Type 2 diabetes and fatty liver.  As evidenced by recent HbA1C of 7.7%. [place]-3.3 Overweight/obesity As related to history of excess calories and inadequate physical activity.  As evidenced by patient with current BMI of 39.43, fatty liver.  Intervention:  . Instruction and discussion as noted above. . Patient has begun making positive diet changes to improve liver health and diabetes control. . Established goals to reduce consumption of starchy foods and increase low carb vegetables, with input from patient.  . Encouraged maintaining or increasing physical activity.  . No follow up scheduled at this time; patient will schedule later if able.  Education Materials given:  . Plate Planner with food lists, sample meal pattern . Get Healthy with Mediterranean Eating . Diabetes Management Mediterranean Meal Planning Guide . Visit summary with goals/ instructions   Learner/ who was taught:  . Patient   Level of understanding: Marland Kitchen Verbalizes/ demonstrates competency   Demonstrated degree of understanding via:   Teach back Learning barriers: . None  Willingness to learn/ readiness for change: . Eager, change in progress   Monitoring and Evaluation:  Dietary  intake, exercise, blood sugars and liver health, and body weight      follow up: in 4-6 week(s) as able

## 2020-08-16 NOTE — Telephone Encounter (Signed)
No longer a pt of [facility] please route accordingly.

## 2020-10-18 ENCOUNTER — Other Ambulatory Visit: Payer: Self-pay | Admitting: Family Medicine

## 2020-10-18 DIAGNOSIS — E785 Hyperlipidemia, unspecified: Secondary | ICD-10-CM

## 2020-11-26 ENCOUNTER — Other Ambulatory Visit: Payer: Self-pay | Admitting: Family Medicine

## 2020-12-21 ENCOUNTER — Other Ambulatory Visit: Payer: Self-pay | Admitting: Gastroenterology

## 2020-12-21 ENCOUNTER — Other Ambulatory Visit (HOSPITAL_COMMUNITY): Payer: Self-pay | Admitting: Gastroenterology

## 2020-12-21 DIAGNOSIS — K7460 Unspecified cirrhosis of liver: Secondary | ICD-10-CM

## 2021-01-24 ENCOUNTER — Ambulatory Visit: Payer: Medicare HMO

## 2021-02-13 ENCOUNTER — Ambulatory Visit
Admission: RE | Admit: 2021-02-13 | Discharge: 2021-02-13 | Disposition: A | Discharge status: Home / Self Care | Payer: Medicare HMO | Source: Ambulatory Visit | Attending: Gastroenterology | Admitting: Gastroenterology

## 2021-02-13 ENCOUNTER — Other Ambulatory Visit: Payer: Self-pay

## 2021-02-13 DIAGNOSIS — K7460 Unspecified cirrhosis of liver: Secondary | ICD-10-CM | POA: Diagnosis present

## 2021-03-06 DIAGNOSIS — U071 COVID-19: Secondary | ICD-10-CM

## 2021-03-06 HISTORY — DX: COVID-19: U07.1

## 2021-03-15 ENCOUNTER — Encounter: Payer: Self-pay | Admitting: *Deleted

## 2021-03-18 ENCOUNTER — Ambulatory Visit: Payer: Medicare HMO | Admitting: Certified Registered Nurse Anesthetist

## 2021-03-18 ENCOUNTER — Other Ambulatory Visit: Payer: Self-pay

## 2021-03-18 ENCOUNTER — Ambulatory Visit
Admission: RE | Admit: 2021-03-18 | Discharge: 2021-03-18 | Disposition: A | Discharge status: Home / Self Care | Payer: Medicare HMO | Attending: Gastroenterology | Admitting: Gastroenterology

## 2021-03-18 ENCOUNTER — Encounter
Admission: RE | Disposition: A | Discharge status: Home / Self Care | Payer: Self-pay | Source: Home / Self Care | Attending: Gastroenterology

## 2021-03-18 ENCOUNTER — Encounter: Payer: Self-pay | Admitting: *Deleted

## 2021-03-18 DIAGNOSIS — E119 Type 2 diabetes mellitus without complications: Secondary | ICD-10-CM | POA: Insufficient documentation

## 2021-03-18 DIAGNOSIS — Z791 Long term (current) use of non-steroidal anti-inflammatories (NSAID): Secondary | ICD-10-CM | POA: Insufficient documentation

## 2021-03-18 DIAGNOSIS — K5730 Diverticulosis of large intestine without perforation or abscess without bleeding: Secondary | ICD-10-CM | POA: Diagnosis not present

## 2021-03-18 DIAGNOSIS — Z1211 Encounter for screening for malignant neoplasm of colon: Secondary | ICD-10-CM | POA: Diagnosis present

## 2021-03-18 DIAGNOSIS — Z6836 Body mass index (BMI) 36.0-36.9, adult: Secondary | ICD-10-CM | POA: Diagnosis not present

## 2021-03-18 DIAGNOSIS — E785 Hyperlipidemia, unspecified: Secondary | ICD-10-CM | POA: Insufficient documentation

## 2021-03-18 DIAGNOSIS — Z79899 Other long term (current) drug therapy: Secondary | ICD-10-CM | POA: Insufficient documentation

## 2021-03-18 DIAGNOSIS — I10 Essential (primary) hypertension: Secondary | ICD-10-CM | POA: Diagnosis not present

## 2021-03-18 DIAGNOSIS — Z800 Family history of malignant neoplasm of digestive organs: Secondary | ICD-10-CM | POA: Insufficient documentation

## 2021-03-18 DIAGNOSIS — Z7984 Long term (current) use of oral hypoglycemic drugs: Secondary | ICD-10-CM | POA: Insufficient documentation

## 2021-03-18 DIAGNOSIS — Z882 Allergy status to sulfonamides status: Secondary | ICD-10-CM | POA: Insufficient documentation

## 2021-03-18 DIAGNOSIS — K760 Fatty (change of) liver, not elsewhere classified: Secondary | ICD-10-CM | POA: Insufficient documentation

## 2021-03-18 DIAGNOSIS — K7460 Unspecified cirrhosis of liver: Secondary | ICD-10-CM | POA: Insufficient documentation

## 2021-03-18 DIAGNOSIS — Z8616 Personal history of COVID-19: Secondary | ICD-10-CM | POA: Diagnosis not present

## 2021-03-18 DIAGNOSIS — Z7982 Long term (current) use of aspirin: Secondary | ICD-10-CM | POA: Insufficient documentation

## 2021-03-18 DIAGNOSIS — G4730 Sleep apnea, unspecified: Secondary | ICD-10-CM | POA: Diagnosis not present

## 2021-03-18 DIAGNOSIS — Z888 Allergy status to other drugs, medicaments and biological substances status: Secondary | ICD-10-CM | POA: Diagnosis not present

## 2021-03-18 HISTORY — DX: Unspecified macular degeneration: H35.30

## 2021-03-18 HISTORY — PX: ESOPHAGOGASTRODUODENOSCOPY (EGD) WITH PROPOFOL: SHX5813

## 2021-03-18 HISTORY — DX: Other reaction to spinal and lumbar puncture: G97.1

## 2021-03-18 HISTORY — PX: COLONOSCOPY WITH PROPOFOL: SHX5780

## 2021-03-18 LAB — GLUCOSE, CAPILLARY: Glucose-Capillary: 122 mg/dL — ABNORMAL HIGH (ref 70–99)

## 2021-03-18 SURGERY — COLONOSCOPY WITH PROPOFOL
Anesthesia: General

## 2021-03-18 MED ORDER — GLYCOPYRROLATE 0.2 MG/ML IJ SOLN
INTRAMUSCULAR | Status: AC
  Filled 2021-03-18: qty 1

## 2021-03-18 MED ORDER — OXYMETAZOLINE HCL 0.05 % NA SOLN
NASAL | Status: AC
  Filled 2021-03-18: qty 30

## 2021-03-18 MED ORDER — PROPOFOL 500 MG/50ML IV EMUL
INTRAVENOUS | Status: AC
  Filled 2021-03-18: qty 250

## 2021-03-18 MED ORDER — PHENYLEPHRINE HCL (PRESSORS) 10 MG/ML IV SOLN
INTRAVENOUS | Status: AC
  Filled 2021-03-18: qty 1

## 2021-03-18 MED ORDER — OXYMETAZOLINE HCL 0.05 % NA SOLN
NASAL | Status: DC | PRN
  Administered 2021-03-18: 2 via NASAL

## 2021-03-18 MED ORDER — PROPOFOL 10 MG/ML IV BOLUS
INTRAVENOUS | Status: DC | PRN
  Administered 2021-03-18: 70 mg via INTRAVENOUS

## 2021-03-18 MED ORDER — SODIUM CHLORIDE 0.9 % IV SOLN: INTRAVENOUS | Status: DC

## 2021-03-18 MED ORDER — PROPOFOL 500 MG/50ML IV EMUL
INTRAVENOUS | Status: DC | PRN
  Administered 2021-03-18: 150 ug/kg/min via INTRAVENOUS

## 2021-03-18 MED ORDER — LIDOCAINE HCL (CARDIAC) PF 100 MG/5ML IV SOSY
PREFILLED_SYRINGE | INTRAVENOUS | Status: DC | PRN
  Administered 2021-03-18: 100 mg via INTRAVENOUS

## 2021-03-18 NOTE — H&P (Signed)
Outpatient short stay form Pre-procedure 03/18/2021  Alyssa Bill, MD  Primary Physician: [name], MD  Reason for visit:  Variceal screening/Colon cancer screening  History of present illness:   75 y/o lady with history of A1AT cirrhosis here for variceal surveillance and colon cancer screening. Mother with colon cancer at young age. Last colonoscopy with poor prep.    Current Facility-Administered Medications:    0.9 %  sodium chloride infusion, , Intravenous, Continuous, Caterra Ostroff, Rossie Muskrat, MD  Medications Prior to Admission  Medication Sig Dispense Refill Last Dose   Cholecalciferol 25 MCG (1000 UT) tablet Take 1,000 Units by mouth daily.   03/16/2021   glipiZIDE (GLUCOTROL XL) 5 MG 24 hr tablet Take 5 mg by mouth daily with breakfast.   03/16/2021   Multiple Vitamins-Minerals (PRESERVISION AREDS 2) CAPS Take 2 tablets by mouth daily.   03/17/2021   aspirin EC 81 MG tablet Take 1 tablet (81 mg total) by mouth daily. Take at least one hour BEFORE the Celebrex 30 tablet 11 03/16/2021   celecoxib (CELEBREX) 200 MG capsule Take 1 capsule (200 mg total) by mouth daily. 90 capsule 1 03/16/2021   dapagliflozin propanediol (FARXIGA) 10 MG TABS tablet Take 10 mg by mouth daily. 90 tablet 1 03/16/2021   gabapentin (NEURONTIN) 100 MG capsule Take by mouth daily. At bed time   03/16/2021   glipiZIDE (GLUCOTROL XL) 5 MG 24 hr tablet Take by mouth.      glucose blood (ONE TOUCH ULTRA TEST) test strip CHECK BLOOD SUGAR AS DIRECTED. 100 each 12    metFORMIN (GLUCOPHAGE) 500 MG tablet Take 2 tablets (1,000 mg total) by mouth 2 (two) times daily. 360 tablet 1 03/16/2021   pantoprazole (PROTONIX) 40 MG tablet Take 1 tablet (40 mg total) by mouth daily. 90 tablet 1 03/16/2021   simvastatin (ZOCOR) 40 MG tablet Take 1 tablet (40 mg total) by mouth daily. 90 tablet 1 03/16/2021     Allergies  Allergen Reactions   Neosporin [Neomycin-Bacitracin Zn-Polymyx] Hives   Ozempic [Semaglutide] Hives    Polymyxin B Other (See Comments)    Eyes redness   Sulfa Antibiotics Itching     Past Medical History:  Diagnosis Date   Abnormal levels of other serum enzymes 07/17/2015   Arthritis    Constipation 07/11/2015   COVID-19 03/06/2021   Diabetes mellitus without complication (HCC)    Elevated liver enzymes    Fatty liver    Generalized abdominal pain 07/11/2015   Hyperlipidemia    Hypertension    Lifelong obesity    Macular degeneration    Morbid obesity due to excess calories (HCC) 07/11/2015   Other fatigue 07/11/2015   Sleep apnea    Spinal headache     Review of systems:  Otherwise negative.    Physical Exam  Gen: Alert, oriented. Appears stated age.  HEENT: PERRLA. Lungs: No respiratory distress CV: RRR Abd: soft, benign, no masses Ext: No edema    Planned procedures: Proceed with EGD/colonoscopy. The patient understands the nature of the planned procedure, indications, risks, alternatives and potential complications including but not limited to bleeding, infection, perforation, damage to internal organs and possible oversedation/side effects from anesthesia. The patient agrees and gives consent to proceed.  Please refer to procedure notes for findings, recommendations and patient disposition/instructions.     Alyssa Bill, MD [facility]

## 2021-03-18 NOTE — Op Note (Signed)
[facility] Patient Name: Alyssa Mayo Procedure Date: 03/18/2021 7:59 AM MRN: [id number] Account #: [id number] Date of Birth: 16-Dec-1945 Admit Type: Outpatient Age: 75 Room: [facility] ENDO ROOM 3 Gender: Female Note Status: Finalized Procedure:             Colonoscopy Indications:           Screening in patient at increased risk: Colorectal                         cancer in mother before age 34 Providers:             Andrey Farmer MD, MD Medicines:             Monitored Anesthesia Care Complications:         No immediate complications. Procedure:             Pre-Anesthesia Assessment:                        - Prior to the procedure, a History and Physical was                         performed, and patient medications and allergies were                         reviewed. The patient is competent. The risks and                         benefits of the procedure and the sedation options and                         risks were discussed with the patient. All questions                         were answered and informed consent was obtained.                         Patient identification and proposed procedure were                         verified by the physician, the nurse, the anesthetist                         and the technician in the endoscopy suite. Mental                         Status Examination: alert and oriented. Airway                         Examination: normal oropharyngeal airway and neck                         mobility. Respiratory Examination: clear to                         auscultation. CV Examination: normal. Prophylactic                         Antibiotics: The patient does not require prophylactic  antibiotics. Prior Anticoagulants: The patient has                         taken no previous anticoagulant or antiplatelet                         agents. ASA Grade Assessment: III - A patient with                          severe systemic disease. After reviewing the risks and                         benefits, the patient was deemed in satisfactory                         condition to undergo the procedure. The anesthesia                         plan was to use monitored anesthesia care (MAC).                         Immediately prior to administration of medications,                         the patient was re-assessed for adequacy to receive                         sedatives. The heart rate, respiratory rate, oxygen                         saturations, blood pressure, adequacy of pulmonary                         ventilation, and response to care were monitored                         throughout the procedure. The physical status of the                         patient was re-assessed after the procedure.                        After obtaining informed consent, the colonoscope was                         passed under direct vision. Throughout the procedure,                         the patient's blood pressure, pulse, and oxygen                         saturations were monitored continuously. The                         Colonoscope was introduced through the anus and                         advanced to the the cecum, identified by appendiceal  orifice and ileocecal valve. The colonoscopy was                         somewhat difficult due to significant looping.                         Successful completion of the procedure was aided by                         straightening and shortening the scope to obtain bowel                         loop reduction. The patient tolerated the procedure                         well. The quality of the bowel preparation was good. Findings:      The perianal and digital rectal examinations were normal.      A few small and large-mouthed diverticula were found in the sigmoid       colon.      The exam was otherwise without abnormality on direct and  retroflexion       views. Impression:            - Diverticulosis in the sigmoid colon.                        - The examination was otherwise normal on direct and                         retroflexion views.                        - No specimens collected. Recommendation:        - Discharge patient to home.                        - Resume previous diet.                        - Continue present medications.                        - Repeat colonoscopy in 5 years for screening purposes                         if benefits outweigh risks.                        - Return to referring physician as previously                         scheduled. Procedure Code(s):     --- Professional ---                        W8032, Colorectal cancer screening; colonoscopy on                         individual at high risk Diagnosis Code(s):     --- Professional ---  Z80.0, Family history of malignant neoplasm of                         digestive organs                        K57.30, Diverticulosis of large intestine without                         perforation or abscess without bleeding CPT copyright 2019 American Medical Association. All rights reserved. The codes documented in this report are preliminary and upon coder review may  be revised to meet current compliance requirements. Andrey Farmer MD, MD 03/18/2021 8:57:55 AM Number of Addenda: 0 Note Initiated On: 03/18/2021 7:59 AM Scope Withdrawal Time: 0 hours 10 minutes 0 seconds  Total Procedure Duration: 0 hours 17 minutes 23 seconds  Estimated Blood Loss:  Estimated blood loss: none.      [facility]

## 2021-03-18 NOTE — Transfer of Care (Signed)
Immediate Anesthesia Transfer of Care Note  Patient: Enid Cutter  Procedure(s) Performed: COLONOSCOPY WITH PROPOFOL ESOPHAGOGASTRODUODENOSCOPY (EGD) WITH PROPOFOL  Patient Location: PACU  Anesthesia Type:General  Level of Consciousness: drowsy  Airway & Oxygen Therapy: Patient Spontanous Breathing and Patient connected to nasal cannula oxygen  Post-op Assessment: Report given to RN and Post -op Vital signs reviewed and stable  Post vital signs: Reviewed and stable  Last Vitals:  Vitals Value Taken Time  BP 147/83 03/18/21 0854  Temp 36 C 03/18/21 0854  Pulse 87 03/18/21 0855  Resp 23 03/18/21 0855  SpO2 100 % 03/18/21 0855  Vitals shown include unvalidated device data.  Last Pain:  Vitals:   03/18/21 0854  TempSrc: Tympanic  PainSc: Asleep         Complications: No notable events documented.

## 2021-03-18 NOTE — Interval H&P Note (Signed)
History and Physical Interval Note:  03/18/2021 8:12 AM  Alyssa Mayo  has presented today for surgery, with the diagnosis of SCREENING CIRRHOSIS OF LIVER.  The various methods of treatment have been discussed with the patient and family. After consideration of risks, benefits and other options for treatment, the patient has consented to  Procedure(s) with comments: COLONOSCOPY WITH PROPOFOL (N/A) - DM ESOPHAGOGASTRODUODENOSCOPY (EGD) WITH PROPOFOL (N/A) as a surgical intervention.  The patient's history has been reviewed, patient examined, no change in status, stable for surgery.  I have reviewed the patient's chart and labs.  Questions were answered to the patient's satisfaction.     [name]  Ok to proceed with EGD/Colonoscopy

## 2021-03-18 NOTE — Anesthesia Postprocedure Evaluation (Signed)
Anesthesia Post Note  Patient: Alyssa Mayo  Procedure(s) Performed: COLONOSCOPY WITH PROPOFOL ESOPHAGOGASTRODUODENOSCOPY (EGD) WITH PROPOFOL  Patient location during evaluation: PACU Anesthesia Type: General Level of consciousness: awake and alert Pain management: pain level controlled Vital Signs Assessment: post-procedure vital signs reviewed and stable Respiratory status: spontaneous breathing, nonlabored ventilation, respiratory function stable and patient connected to nasal cannula oxygen Cardiovascular status: blood pressure returned to baseline and stable Postop Assessment: no apparent nausea or vomiting Anesthetic complications: no   No notable events documented.   Last Vitals:  Vitals:   03/18/21 0757 03/18/21 0854  BP: (!) 157/78 (!) 147/83  Pulse: 95 87  Resp:  20  Temp: 36.4 C (!) 36 C  SpO2: 95% 100%    Last Pain:  Vitals:   03/18/21 0854  TempSrc: Tympanic  PainSc: Asleep                 Yevette Edwards

## 2021-03-18 NOTE — Anesthesia Preprocedure Evaluation (Signed)
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  We will use a small block and remove after egd but she understands we cannot eliminate this risk. ja:   Pulmonary sleep apnea and Continuous Positive Airway Pressure Ventilation ,    Pulmonary exam normal        Cardiovascular Exercise Tolerance: Poor hypertension, On Medications negative cardio ROS Normal cardiovascular exam Rhythm:regular Rate:Normal     Neuro/Psych  Headaches,  Neuromuscular disease negative psych ROS   GI/Hepatic Neg liver ROS, GERD  Medicated,  Endo/Other  negative endocrine ROSdiabetes, Well Controlled, Type 2, Oral Hypoglycemic Agents  Renal/GU negative Renal ROS  negative genitourinary   Musculoskeletal   Abdominal   Peds  Hematology negative hematology ROS (+)   Anesthesia Other Findings Past Medical History: 07/17/2015: Abnormal levels of other serum enzymes No date: Arthritis 07/11/2015: Constipation 03/06/2021: COVID-19 No date: Diabetes mellitus without complication (HCC) No date: Elevated liver enzymes No date: Fatty liver 07/11/2015: Generalized abdominal pain No date: Hyperlipidemia No date: Hypertension No date: Lifelong obesity No date: Macular degeneration 07/11/2015: Morbid obesity due to excess calories (HCC) 07/11/2015: Other fatigue No date: Sleep apnea No date: Spinal headache Past Surgical History: No date: ABDOMINAL HYSTERECTOMY No date: APPENDECTOMY No date: CATARACT  EXTRACTION 07/21/1985: CERVICAL LAMINECTOMY No date: CESAREAN SECTION 07/21/2012: COLONOSCOPY     Comment:  diverticulosis, [facility] Dr. [name]  02/04/2019: COLONOSCOPY WITH PROPOFOL; N/A     Comment:  Procedure: COLONOSCOPY WITH PROPOFOL;  Surgeon:               [name], MD;  Location: [facility] ENDOSCOPY;                Service: Endoscopy;  Laterality: N/A; No date: DIAGNOSTIC LAPAROSCOPY 02/04/2019: ESOPHAGOGASTRODUODENOSCOPY (EGD) WITH PROPOFOL; N/A     Comment:  Procedure: ESOPHAGOGASTRODUODENOSCOPY (EGD) WITH               PROPOFOL;  Surgeon: [name], MD;  Location:               [facility] ENDOSCOPY;  Service: Endoscopy;  Laterality: N/A; No date: SPINE SURGERY No date: TUBAL LIGATION BMI    Body Mass Index: 36.31 kg/m     Reproductive/Obstetrics negative OB ROS                             Anesthesia Physical Anesthesia Plan  ASA: 3  Anesthesia Plan: General   Post-op Pain Management:    Induction:   PONV Risk Score and Plan:   Airway Management Planned:   Additional Equipment:   Intra-op Plan:   Post-operative Plan:   Informed Consent: I have reviewed the patients History and Physical, chart, labs and discussed the procedure including the risks, benefits and alternatives for the proposed anesthesia with the patient or authorized representative who has indicated his/her understanding and acceptance.     Dental Advisory Given  Plan Discussed with: CRNA  Anesthesia Plan Comments:  Anesthesia Quick Evaluation  

## 2021-03-18 NOTE — Op Note (Signed)
[facility] Patient Name: Alyssa Mayo Procedure Date: 03/18/2021 7:59 AM MRN: [id number] Account #: [id number] Date of Birth: 11-Apr-1946 Admit Type: Outpatient Age: 75 Room: [facility] ENDO ROOM 3 Gender: Female Note Status: Finalized Procedure:             Upper GI endoscopy Indications:           Cirrhosis Providers:             Andrey Farmer MD, MD Medicines:             Monitored Anesthesia Care Complications:         No immediate complications. Procedure:             Pre-Anesthesia Assessment:                        - Prior to the procedure, a History and Physical was                         performed, and patient medications and allergies were                         reviewed. The patient is competent. The risks and                         benefits of the procedure and the sedation options and                         risks were discussed with the patient. All questions                         were answered and informed consent was obtained.                         Patient identification and proposed procedure were                         verified by the physician, the nurse, the anesthetist                         and the technician in the endoscopy suite. Mental                         Status Examination: alert and oriented. Airway                         Examination: normal oropharyngeal airway and neck                         mobility. Respiratory Examination: clear to                         auscultation. CV Examination: normal. Prophylactic                         Antibiotics: The patient does not require prophylactic                         antibiotics. Prior Anticoagulants: The patient has  taken no previous anticoagulant or antiplatelet                         agents. ASA Grade Assessment: III - A patient with                         severe systemic disease. After reviewing the risks and                          benefits, the patient was deemed in satisfactory                         condition to undergo the procedure. The anesthesia                         plan was to use monitored anesthesia care (MAC).                         Immediately prior to administration of medications,                         the patient was re-assessed for adequacy to receive                         sedatives. The heart rate, respiratory rate, oxygen                         saturations, blood pressure, adequacy of pulmonary                         ventilation, and response to care were monitored                         throughout the procedure. The physical status of the                         patient was re-assessed after the procedure.                        After obtaining informed consent, the endoscope was                         passed under direct vision. Throughout the procedure,                         the patient's blood pressure, pulse, and oxygen                         saturations were monitored continuously. The Endoscope                         was introduced through the mouth, and advanced to the                         second part of duodenum. The upper GI endoscopy was                         accomplished without difficulty. The patient tolerated  the procedure well. Findings:      There is no endoscopic evidence of varices in the entire esophagus.      The examined esophagus was normal.      The entire examined stomach was normal.      The examined duodenum was normal. Impression:            - Normal esophagus.                        - Normal stomach.                        - Normal examined duodenum.                        - No specimens collected. Recommendation:        - Perform a colonoscopy today. Procedure Code(s):     --- Professional ---                        [phone], Esophagogastroduodenoscopy, flexible,                         transoral; diagnostic, including  collection of                         specimen(s) by brushing or washing, when performed                         (separate procedure) Diagnosis Code(s):     --- Professional ---                        K74.60, Unspecified cirrhosis of liver CPT copyright 2019 American Medical Association. All rights reserved. The codes documented in this report are preliminary and upon coder review may  be revised to meet current compliance requirements. Andrey Farmer MD, MD 03/18/2021 8:54:43 AM Number of Addenda: 0 Note Initiated On: 03/18/2021 7:59 AM Estimated Blood Loss:  Estimated blood loss: none.      [facility]

## 2021-03-19 ENCOUNTER — Encounter: Payer: Self-pay | Admitting: Gastroenterology

## 2021-06-05 ENCOUNTER — Other Ambulatory Visit: Payer: Self-pay | Admitting: Gastroenterology

## 2021-06-05 ENCOUNTER — Other Ambulatory Visit (HOSPITAL_COMMUNITY): Payer: Self-pay | Admitting: Gastroenterology

## 2021-06-05 DIAGNOSIS — K7460 Unspecified cirrhosis of liver: Secondary | ICD-10-CM

## 2021-07-23 ENCOUNTER — Ambulatory Visit (INDEPENDENT_AMBULATORY_CARE_PROVIDER_SITE_OTHER): Payer: Medicare HMO

## 2021-07-23 ENCOUNTER — Other Ambulatory Visit: Payer: Self-pay

## 2021-07-23 ENCOUNTER — Ambulatory Visit
Admission: EM | Admit: 2021-07-23 | Discharge: 2021-07-23 | Disposition: A | Discharge status: Home / Self Care | Payer: Medicare HMO | Attending: Physician Assistant | Admitting: Physician Assistant

## 2021-07-23 DIAGNOSIS — J189 Pneumonia, unspecified organism: Secondary | ICD-10-CM | POA: Insufficient documentation

## 2021-07-23 DIAGNOSIS — E119 Type 2 diabetes mellitus without complications: Secondary | ICD-10-CM | POA: Insufficient documentation

## 2021-07-23 DIAGNOSIS — E785 Hyperlipidemia, unspecified: Secondary | ICD-10-CM | POA: Diagnosis not present

## 2021-07-23 DIAGNOSIS — Z6838 Body mass index (BMI) 38.0-38.9, adult: Secondary | ICD-10-CM | POA: Insufficient documentation

## 2021-07-23 DIAGNOSIS — E669 Obesity, unspecified: Secondary | ICD-10-CM | POA: Diagnosis not present

## 2021-07-23 DIAGNOSIS — Z20822 Contact with and (suspected) exposure to covid-19: Secondary | ICD-10-CM | POA: Insufficient documentation

## 2021-07-23 DIAGNOSIS — R051 Acute cough: Secondary | ICD-10-CM | POA: Insufficient documentation

## 2021-07-23 DIAGNOSIS — R0602 Shortness of breath: Secondary | ICD-10-CM

## 2021-07-23 DIAGNOSIS — R059 Cough, unspecified: Secondary | ICD-10-CM

## 2021-07-23 DIAGNOSIS — I10 Essential (primary) hypertension: Secondary | ICD-10-CM | POA: Insufficient documentation

## 2021-07-23 DIAGNOSIS — Z8616 Personal history of COVID-19: Secondary | ICD-10-CM | POA: Insufficient documentation

## 2021-07-23 LAB — RESP PANEL BY RT-PCR (FLU A&B, COVID) ARPGX2
Influenza A by PCR: NEGATIVE
Influenza B by PCR: NEGATIVE
SARS Coronavirus 2 by RT PCR: NEGATIVE

## 2021-07-23 MED ORDER — AZITHROMYCIN 250 MG PO TABS: 250.0000 mg | ORAL_TABLET | Freq: Every day | ORAL | 0 refills | Status: DC

## 2021-07-23 MED ORDER — CHERATUSSIN AC 100-10 MG/5ML PO SOLN: 10.0000 mL | Freq: Three times a day (TID) | ORAL | 0 refills | Status: DC | PRN

## 2021-07-23 MED ORDER — AMOXICILLIN-POT CLAVULANATE 875-125 MG PO TABS: 1.0000 | ORAL_TABLET | Freq: Two times a day (BID) | ORAL | 0 refills | Status: AC

## 2021-07-23 MED ORDER — ALBUTEROL SULFATE HFA 108 (90 BASE) MCG/ACT IN AERS: 1.0000 | INHALATION_SPRAY | Freq: Four times a day (QID) | RESPIRATORY_TRACT | 0 refills | Status: DC | PRN

## 2021-07-23 NOTE — ED Triage Notes (Signed)
Pt c/o cough x5-6days. Pt states that she went to [place] and was in a location that just had New carpet installed. Pt believed she was allergic to the new carpet but coughing has continued since returning home.

## 2021-07-23 NOTE — Discharge Instructions (Signed)
-  Your x-ray shows that you have pneumonia.  I have sent in 2 different antibiotics for you to take.  Make sure you take the full course. - I also sent something for cough and an inhaler. - Make sure you are resting and staying hydrated. -If you have a fever, increased chest pain or more breathing difficulty need to go to the emergency department. - Please make a follow-up appointment with your PCP.  You need to have a repeat x-ray of your chest in 3 to 4 weeks to make sure the pneumonia has cleared up.

## 2021-07-23 NOTE — ED Provider Notes (Signed)
[facility]    CSN: [id number] Arrival date & time: 07/23/21  1640      History   Chief Complaint Chief Complaint  Patient presents with   Cough    HPI Alyssa Mayo is a 76 y.o. female presenting for 5 to 6-day history of cough that has become productive over the past 1 to 2 days.  Patient also reports shortness of breath and runny nose and nasal congestion.  Increased fatigue.  Difficulty sleeping due to cough.  Patient says when she takes a deep breath she starts coughing.  Reports when she gets into a coughing that she feels like she cannot breathe and has wheezing.  Patient says that she went to [place] and was around her family who installed new carpet.  Patient believes her symptoms started at that time and says she thought it could be related to the carpet and possible allergy.  She has not had any fevers.  Patient reports her chest hurts when she coughs.  No known COVID or flu exposure.  Denies any sick contacts.  Medical history significant for hypertension, hyperlipidemia, diabetes, obesity.  Patient reports personal history of COVID-19 4.5 months ago.  Has been taking Mucinex for symptoms.  No other complaints.  HPI  Past Medical History:  Diagnosis Date   Abnormal levels of other serum enzymes 07/17/2015   Arthritis    Constipation 07/11/2015   COVID-19 03/06/2021   Diabetes mellitus without complication (HCC)    Elevated liver enzymes    Fatty liver    Generalized abdominal pain 07/11/2015   Hyperlipidemia    Hypertension    Lifelong obesity    Macular degeneration    Morbid obesity due to excess calories ([place]) 07/11/2015   Other fatigue 07/11/2015   Sleep apnea    Spinal headache     Patient Active Problem List   Diagnosis Date Noted   GERD (gastroesophageal reflux disease) 07/20/2019   Advanced care planning/counseling discussion 12/17/2016   Skin lesions, generalized 11/13/2016   Chronic right hip pain 06/17/2016   Vitamin D deficiency  09/26/2015   Diastasis recti 08/08/2015   Elevated serum GGT level 07/24/2015   Essential hypertension 07/17/2015   Fatty liver 07/17/2015   Elevated alkaline phosphatase level 07/17/2015   Elevated serum glutamic pyruvic transaminase (SGPT) level 07/17/2015   Abnormal finding on EKG 07/11/2015   Morbid obesity ([place]) 07/11/2015   Colon, diverticulosis 07/11/2015   Abdominal wall hernia 07/11/2015   DM (diabetes mellitus), type 2 with neurological complications ([place])    Hyperlipidemia     Past Surgical History:  Procedure Laterality Date   ABDOMINAL HYSTERECTOMY     APPENDECTOMY     CATARACT EXTRACTION     CERVICAL LAMINECTOMY  07/21/1985   CESAREAN SECTION     COLONOSCOPY  07/21/2012   diverticulosis, [facility] Dr. Donnella Sham    COLONOSCOPY WITH PROPOFOL N/A 02/04/2019   Procedure: COLONOSCOPY WITH PROPOFOL;  Surgeon: Lollie Sails, MD;  Location: [facility] ENDOSCOPY;  Service: Endoscopy;  Laterality: N/A;   COLONOSCOPY WITH PROPOFOL N/A 03/18/2021   Procedure: COLONOSCOPY WITH PROPOFOL;  Surgeon: Lesly Rubenstein, MD;  Location: [facility] ENDOSCOPY;  Service: Endoscopy;  Laterality: N/A;  DM   DIAGNOSTIC LAPAROSCOPY     ESOPHAGOGASTRODUODENOSCOPY (EGD) WITH PROPOFOL N/A 02/04/2019   Procedure: ESOPHAGOGASTRODUODENOSCOPY (EGD) WITH PROPOFOL;  Surgeon: Lollie Sails, MD;  Location: [facility] ENDOSCOPY;  Service: Endoscopy;  Laterality: N/A;   ESOPHAGOGASTRODUODENOSCOPY (EGD) WITH PROPOFOL N/A 03/18/2021   Procedure: ESOPHAGOGASTRODUODENOSCOPY (EGD) WITH  PROPOFOL;  Surgeon: Lesly Rubenstein, MD;  Location: [facility] ENDOSCOPY;  Service: Endoscopy;  Laterality: N/A;   SPINE SURGERY     TUBAL LIGATION      OB History   No obstetric history on file.      Home Medications    Prior to Admission medications   Medication Sig Start Date End Date Taking? Authorizing Provider  albuterol (VENTOLIN HFA) 108 (90 Base) MCG/ACT inhaler Inhale 1-2 puffs into the lungs every 6 (six) hours as needed for  wheezing or shortness of breath. 07/23/21  Yes Laurene Footman B, PA-C  amoxicillin-clavulanate (AUGMENTIN) 875-125 MG tablet Take 1 tablet by mouth every 12 (twelve) hours for 7 days. 07/23/21 07/30/21 Yes Danton Clap, PA-C  aspirin EC 81 MG tablet Take 1 tablet (81 mg total) by mouth daily. Take at least one hour BEFORE the Celebrex 07/11/15  Yes Lada, Satira Anis, MD  azithromycin (ZITHROMAX) 250 MG tablet Take 1 tablet (250 mg total) by mouth daily. Take first 2 tablets together, then 1 every day until finished. 07/23/21  Yes Laurene Footman B, PA-C  celecoxib (CELEBREX) 200 MG capsule Take 1 capsule (200 mg total) by mouth daily. 12/04/19  Yes Volney American, PA-C  Cholecalciferol 25 MCG (1000 UT) tablet Take 1,000 Units by mouth daily.   Yes [provider]  dapagliflozin propanediol (FARXIGA) 10 MG TABS tablet Take 10 mg by mouth daily. 12/04/19  Yes Volney American, PA-C  gabapentin (NEURONTIN) 100 MG capsule Take by mouth daily. At bed time 05/16/19  Yes [provider]  glipiZIDE (GLUCOTROL XL) 5 MG 24 hr tablet Take 5 mg by mouth daily with breakfast.   Yes [provider]  glucose blood (ONE TOUCH ULTRA TEST) test strip CHECK BLOOD SUGAR AS DIRECTED. 04/09/18  Yes Johnson, Megan P, DO  guaiFENesin-codeine (CHERATUSSIN AC) 100-10 MG/5ML syrup Take 10 mLs by mouth 3 (three) times daily as needed for cough. 07/23/21  Yes Danton Clap, PA-C  metFORMIN (GLUCOPHAGE) 500 MG tablet Take 2 tablets (1,000 mg total) by mouth 2 (two) times daily. 12/04/19  Yes Volney American, PA-C  Multiple Vitamins-Minerals (PRESERVISION AREDS 2) CAPS Take 2 tablets by mouth daily. 04/05/18  Yes [provider]  pantoprazole (PROTONIX) 40 MG tablet Take 1 tablet (40 mg total) by mouth daily. 12/04/19  Yes Volney American, PA-C  simvastatin (ZOCOR) 40 MG tablet Take 1 tablet (40 mg total) by mouth daily. 12/04/19  Yes Volney American, PA-C  glipiZIDE (GLUCOTROL  XL) 5 MG 24 hr tablet Take by mouth. 10/08/17 12/16/18  [provider]    Family History Family History  Problem Relation Age of Onset   Cancer Mother        breast   Heart disease Mother    Stroke Mother    Diabetes Mother    Colon cancer Mother    Cancer Father        leukemia   Stroke Father    Leukemia Father    Ovarian cancer Sister    Diabetes Brother    Cancer Brother    Stroke Maternal Grandfather    Dementia Paternal Grandmother    COPD Neg Hx    Hypertension Neg Hx     Social History Social History   Tobacco Use   Smoking status: Never   Smokeless tobacco: Never  Vaping Use   Vaping Use: Never used  Substance Use Topics   Alcohol use: Not Currently  Comment: none last 24 months   Drug use: No     Allergies   Neosporin [neomycin-bacitracin zn-polymyx], Ozempic [semaglutide], Polymyxin b, and Sulfa antibiotics   Review of Systems Review of Systems  Constitutional:  Positive for fatigue. Negative for chills, diaphoresis and fever.  HENT:  Positive for congestion and rhinorrhea. Negative for ear pain and sore throat.   Respiratory:  Positive for cough, shortness of breath and wheezing.   Cardiovascular:  Positive for chest pain.  Gastrointestinal:  Negative for abdominal pain, nausea and vomiting.  Musculoskeletal:  Negative for arthralgias and myalgias.  Skin:  Negative for rash.  Neurological:  Negative for weakness and headaches.  Hematological:  Negative for adenopathy.  Psychiatric/Behavioral:  Positive for sleep disturbance.     Physical Exam Triage Vital Signs ED Triage Vitals  Enc Vitals Group     BP 07/23/21 1715 (!) 161/66     Pulse Rate 07/23/21 1715 96     Resp 07/23/21 1715 18     Temp 07/23/21 1715 98.5 F (36.9 C)     Temp Source 07/23/21 1715 Oral     SpO2 07/23/21 1715 94 %     Weight 07/23/21 1713 217 lb (98.4 kg)     Height 07/23/21 1713 5\' 3"  (1.6 m)     Head Circumference --      Peak Flow --      Pain Score  07/23/21 1713 5     Pain Loc --      Pain Edu? --      Excl. in [place]? --    No data found.  Updated Vital Signs BP (!) 161/66 (BP Location: Left Arm)    Pulse 96    Temp 98.5 F (36.9 C) (Oral)    Resp 18    Ht 5\' 3"  (1.6 m)    Wt 217 lb (98.4 kg)    SpO2 94%    BMI 38.44 kg/m      Physical Exam Vitals and nursing note reviewed.  Constitutional:      General: She is not in acute distress.    Appearance: Normal appearance. She is ill-appearing. She is not toxic-appearing.  HENT:     Head: Normocephalic and atraumatic.     Nose: Congestion present.     Mouth/Throat:     Mouth: Mucous membranes are moist.     Pharynx: Oropharynx is clear.  Eyes:     General: No scleral icterus.       Right eye: No discharge.        Left eye: No discharge.     Conjunctiva/sclera: Conjunctivae normal.  Cardiovascular:     Rate and Rhythm: Normal rate and regular rhythm.     Heart sounds: Normal heart sounds.  Pulmonary:     Effort: Pulmonary effort is normal. No respiratory distress.     Breath sounds: Wheezing (RML, RLL) present.  Musculoskeletal:     Cervical back: Neck supple.  Skin:    General: Skin is dry.  Neurological:     General: No focal deficit present.     Mental Status: She is alert. Mental status is at baseline.     Motor: No weakness.     Gait: Gait normal.  Psychiatric:        Mood and Affect: Mood normal.        Behavior: Behavior normal.        Thought Content: Thought content normal.     UC Treatments / Results  Labs (  all labs ordered are listed, but only abnormal results are displayed) Labs Reviewed  RESP PANEL BY RT-PCR (FLU A&B, COVID) ARPGX2    EKG   Radiology DG Chest 2 View  Result Date: 07/23/2021 CLINICAL DATA:  Short of breath, cough for 5-6 days EXAM: CHEST - 2 VIEW COMPARISON:  None. FINDINGS: Frontal and lateral views of the chest demonstrate an enlarged cardiac silhouette. There is patchy consolidation within the right infrahilar region  concerning for pneumonia. No effusion or pneumothorax. No acute bony abnormalities. IMPRESSION: 1. Right infrahilar airspace disease, likely in the right middle lobe, compatible with pneumonia. 2. Enlarged cardiac silhouette. Electronically Signed   By: Randa Ngo M.D.   On: 07/23/2021 17:47    Procedures Procedures (including critical care time)  Medications Ordered in UC Medications - No data to display  Initial Impression / Assessment and Plan / UC Course  I have reviewed the triage vital signs and the nursing notes.  Pertinent labs & imaging results that were available during my care of the patient were reviewed by me and considered in my medical decision making (see chart for details).  76 year old female presenting for 5 to 6-day history of cough and congestion.  Also reports chest pain, wheezing and shortness of breath as well as nasal congestion.  Vitals reveal elevated BP at 161/66.  Oxygen is 94%.  Patient is afebrile.  She is ill-appearing but nontoxic.  Coughs often during exam.  Exam does reveal nasal congestion and clearish drainage.  She has wheezes of right middle lobe and right lower lobe.  No respiratory distress.  Respiratory panel performed and all negative.  Chest x-ray obtained.  X-ray consistent with infiltrate of right middle lobe.  Discussed this with patient.  We will treat patient for suspected bacterial pneumonia with Augmentin and azithromycin.  Also sent ProAir and Cheratussin after reviewing controlled substance database and finding patient to be low risk for abuse.  Reviewed increasing rest of fluids.  Reviewed going to the ER for any fever, increased chest pain or breathing trouble.  Advise making follow-up appointment with PCP and discussed with her that she will need a repeat chest x-ray in a couple weeks to ensure resolution of the pneumonia or sooner for any worsening symptoms.  Patient agrees to plan.   Final Clinical Impressions(s) / UC Diagnoses    Final diagnoses:  Pneumonia of right middle lobe due to infectious organism  Acute cough  Shortness of breath     Discharge Instructions      -Your x-ray shows that you have pneumonia.  I have sent in 2 different antibiotics for you to take.  Make sure you take the full course. - I also sent something for cough and an inhaler. - Make sure you are resting and staying hydrated. -If you have a fever, increased chest pain or more breathing difficulty need to go to the emergency department. - Please make a follow-up appointment with your PCP.  You need to have a repeat x-ray of your chest in 3 to 4 weeks to make sure the pneumonia has cleared up.     ED Prescriptions     Medication Sig Dispense Auth. Provider   amoxicillin-clavulanate (AUGMENTIN) 875-125 MG tablet Take 1 tablet by mouth every 12 (twelve) hours for 7 days. 14 tablet Laurene Footman B, PA-C   azithromycin (ZITHROMAX) 250 MG tablet Take 1 tablet (250 mg total) by mouth daily. Take first 2 tablets together, then 1 every day until finished. 6 tablet  Danton Clap, PA-C   guaiFENesin-codeine (CHERATUSSIN AC) 100-10 MG/5ML syrup Take 10 mLs by mouth 3 (three) times daily as needed for cough. 118 mL Laurene Footman B, PA-C   albuterol (VENTOLIN HFA) 108 (90 Base) MCG/ACT inhaler Inhale 1-2 puffs into the lungs every 6 (six) hours as needed for wheezing or shortness of breath. 1 g Danton Clap, PA-C      PDMP not reviewed this encounter.   Danton Clap, PA-C 07/23/21 1821

## 2021-07-25 ENCOUNTER — Ambulatory Visit: Payer: Medicare HMO

## 2021-08-01 ENCOUNTER — Inpatient Hospital Stay (HOSPITAL_COMMUNITY)
Admission: AD | Admit: 2021-08-01 | Discharge: 2021-08-07 | DRG: 040 | Disposition: A | Discharge status: Rehabilitation Facility | Payer: Medicare HMO | Source: Intra-hospital | Attending: Internal Medicine | Admitting: Internal Medicine

## 2021-08-01 ENCOUNTER — Emergency Department
Admission: EM | Admit: 2021-08-01 | Discharge: 2021-08-01 | Disposition: A | Discharge status: Other Acute Inpatient Hospital | Payer: Medicare HMO | Attending: Student in an Organized Health Care Education/Training Program | Admitting: Student in an Organized Health Care Education/Training Program

## 2021-08-01 ENCOUNTER — Emergency Department: Payer: Medicare HMO

## 2021-08-01 ENCOUNTER — Encounter: Payer: Self-pay | Admitting: Emergency Medicine

## 2021-08-01 ENCOUNTER — Other Ambulatory Visit: Payer: Self-pay

## 2021-08-01 DIAGNOSIS — Z7984 Long term (current) use of oral hypoglycemic drugs: Secondary | ICD-10-CM

## 2021-08-01 DIAGNOSIS — Z6836 Body mass index (BMI) 36.0-36.9, adult: Secondary | ICD-10-CM

## 2021-08-01 DIAGNOSIS — I160 Hypertensive urgency: Secondary | ICD-10-CM | POA: Diagnosis not present

## 2021-08-01 DIAGNOSIS — I6931 Attention and concentration deficit following cerebral infarction: Secondary | ICD-10-CM | POA: Diagnosis not present

## 2021-08-01 DIAGNOSIS — R29703 NIHSS score 3: Secondary | ICD-10-CM | POA: Diagnosis present

## 2021-08-01 DIAGNOSIS — E1165 Type 2 diabetes mellitus with hyperglycemia: Secondary | ICD-10-CM | POA: Diagnosis present

## 2021-08-01 DIAGNOSIS — M1990 Unspecified osteoarthritis, unspecified site: Secondary | ICD-10-CM | POA: Diagnosis present

## 2021-08-01 DIAGNOSIS — I10 Essential (primary) hypertension: Secondary | ICD-10-CM | POA: Insufficient documentation

## 2021-08-01 DIAGNOSIS — K5901 Slow transit constipation: Secondary | ICD-10-CM | POA: Diagnosis not present

## 2021-08-01 DIAGNOSIS — G4733 Obstructive sleep apnea (adult) (pediatric): Secondary | ICD-10-CM | POA: Diagnosis present

## 2021-08-01 DIAGNOSIS — Z961 Presence of intraocular lens: Secondary | ICD-10-CM | POA: Diagnosis present

## 2021-08-01 DIAGNOSIS — Z90710 Acquired absence of both cervix and uterus: Secondary | ICD-10-CM | POA: Diagnosis not present

## 2021-08-01 DIAGNOSIS — Z888 Allergy status to other drugs, medicaments and biological substances status: Secondary | ICD-10-CM

## 2021-08-01 DIAGNOSIS — Z20822 Contact with and (suspected) exposure to covid-19: Secondary | ICD-10-CM | POA: Insufficient documentation

## 2021-08-01 DIAGNOSIS — E119 Type 2 diabetes mellitus without complications: Secondary | ICD-10-CM | POA: Diagnosis not present

## 2021-08-01 DIAGNOSIS — Z8249 Family history of ischemic heart disease and other diseases of the circulatory system: Secondary | ICD-10-CM

## 2021-08-01 DIAGNOSIS — E669 Obesity, unspecified: Secondary | ICD-10-CM | POA: Diagnosis present

## 2021-08-01 DIAGNOSIS — I639 Cerebral infarction, unspecified: Secondary | ICD-10-CM | POA: Insufficient documentation

## 2021-08-01 DIAGNOSIS — E1169 Type 2 diabetes mellitus with other specified complication: Secondary | ICD-10-CM | POA: Diagnosis not present

## 2021-08-01 DIAGNOSIS — Z7982 Long term (current) use of aspirin: Secondary | ICD-10-CM | POA: Diagnosis not present

## 2021-08-01 DIAGNOSIS — N179 Acute kidney failure, unspecified: Secondary | ICD-10-CM | POA: Diagnosis present

## 2021-08-01 DIAGNOSIS — Z8673 Personal history of transient ischemic attack (TIA), and cerebral infarction without residual deficits: Secondary | ICD-10-CM | POA: Diagnosis not present

## 2021-08-01 DIAGNOSIS — E8809 Other disorders of plasma-protein metabolism, not elsewhere classified: Secondary | ICD-10-CM | POA: Diagnosis not present

## 2021-08-01 DIAGNOSIS — I63511 Cerebral infarction due to unspecified occlusion or stenosis of right middle cerebral artery: Principal | ICD-10-CM | POA: Diagnosis present

## 2021-08-01 DIAGNOSIS — E785 Hyperlipidemia, unspecified: Secondary | ICD-10-CM | POA: Diagnosis present

## 2021-08-01 DIAGNOSIS — J189 Pneumonia, unspecified organism: Secondary | ICD-10-CM | POA: Diagnosis not present

## 2021-08-01 DIAGNOSIS — E1149 Type 2 diabetes mellitus with other diabetic neurological complication: Secondary | ICD-10-CM | POA: Diagnosis present

## 2021-08-01 DIAGNOSIS — R471 Dysarthria and anarthria: Secondary | ICD-10-CM | POA: Diagnosis present

## 2021-08-01 DIAGNOSIS — G936 Cerebral edema: Secondary | ICD-10-CM | POA: Diagnosis present

## 2021-08-01 DIAGNOSIS — Z981 Arthrodesis status: Secondary | ICD-10-CM

## 2021-08-01 DIAGNOSIS — I6389 Other cerebral infarction: Secondary | ICD-10-CM | POA: Diagnosis not present

## 2021-08-01 DIAGNOSIS — R29810 Facial weakness: Secondary | ICD-10-CM | POA: Diagnosis present

## 2021-08-01 DIAGNOSIS — Z9114 Patient's other noncompliance with medication regimen: Secondary | ICD-10-CM

## 2021-08-01 DIAGNOSIS — D72829 Elevated white blood cell count, unspecified: Secondary | ICD-10-CM

## 2021-08-01 DIAGNOSIS — Z885 Allergy status to narcotic agent status: Secondary | ICD-10-CM

## 2021-08-01 DIAGNOSIS — I161 Hypertensive emergency: Secondary | ICD-10-CM | POA: Diagnosis present

## 2021-08-01 DIAGNOSIS — Z882 Allergy status to sulfonamides status: Secondary | ICD-10-CM

## 2021-08-01 DIAGNOSIS — R4182 Altered mental status, unspecified: Secondary | ICD-10-CM | POA: Diagnosis present

## 2021-08-01 DIAGNOSIS — I959 Hypotension, unspecified: Secondary | ICD-10-CM | POA: Diagnosis not present

## 2021-08-01 DIAGNOSIS — Z823 Family history of stroke: Secondary | ICD-10-CM

## 2021-08-01 DIAGNOSIS — Z79899 Other long term (current) drug therapy: Secondary | ICD-10-CM

## 2021-08-01 DIAGNOSIS — Z883 Allergy status to other anti-infective agents status: Secondary | ICD-10-CM

## 2021-08-01 DIAGNOSIS — R414 Neurologic neglect syndrome: Secondary | ICD-10-CM | POA: Diagnosis not present

## 2021-08-01 DIAGNOSIS — R531 Weakness: Secondary | ICD-10-CM | POA: Diagnosis present

## 2021-08-01 DIAGNOSIS — Z8616 Personal history of COVID-19: Secondary | ICD-10-CM

## 2021-08-01 DIAGNOSIS — K760 Fatty (change of) liver, not elsewhere classified: Secondary | ICD-10-CM | POA: Diagnosis present

## 2021-08-01 DIAGNOSIS — I619 Nontraumatic intracerebral hemorrhage, unspecified: Secondary | ICD-10-CM | POA: Diagnosis present

## 2021-08-01 LAB — URINALYSIS, ROUTINE W REFLEX MICROSCOPIC
Bacteria, UA: NONE SEEN
Bilirubin Urine: NEGATIVE
Bilirubin Urine: NEGATIVE
Glucose, UA: >=500 mg/dL — AB
Glucose, UA: >=500 mg/dL — AB
Hgb urine dipstick: NEGATIVE
Hgb urine dipstick: NEGATIVE
Ketones, ur: 20 mg/dL — AB
Ketones, ur: 20 mg/dL — AB
Leukocytes,Ua: NEGATIVE
Leukocytes,Ua: NEGATIVE
Nitrite: NEGATIVE
Nitrite: NEGATIVE
Protein, ur: NEGATIVE mg/dL
Protein, ur: NEGATIVE mg/dL
Specific Gravity, Urine: 1.028 (ref 1.005–1.030)
Specific Gravity, Urine: 1.032 — ABNORMAL HIGH (ref 1.005–1.030)
Squamous Epithelial / HPF: NONE SEEN (ref 0–5)
pH: 5 (ref 5.0–8.0)
pH: 5 (ref 5.0–8.0)

## 2021-08-01 LAB — DIFFERENTIAL
Abs Immature Granulocytes: 0.05 10*3/uL (ref 0.00–0.07)
Basophils Absolute: 0.1 10*3/uL (ref 0.0–0.1)
Basophils Relative: 0 %
Eosinophils Absolute: 0.1 10*3/uL (ref 0.0–0.5)
Eosinophils Relative: 1 %
Immature Granulocytes: 0 %
Lymphocytes Relative: 15 %
Lymphs Abs: 2.2 10*3/uL (ref 0.7–4.0)
Monocytes Absolute: 0.7 10*3/uL (ref 0.1–1.0)
Monocytes Relative: 5 %
Neutro Abs: 11 10*3/uL — ABNORMAL HIGH (ref 1.7–7.7)
Neutrophils Relative %: 79 %

## 2021-08-01 LAB — RESP PANEL BY RT-PCR (FLU A&B, COVID) ARPGX2
Influenza A by PCR: NEGATIVE
Influenza B by PCR: NEGATIVE
SARS Coronavirus 2 by RT PCR: NEGATIVE

## 2021-08-01 LAB — COMPREHENSIVE METABOLIC PANEL
ALT: 16 U/L (ref 0–44)
AST: 14 U/L — ABNORMAL LOW (ref 15–41)
Albumin: 3.8 g/dL (ref 3.5–5.0)
Alkaline Phosphatase: 124 U/L (ref 38–126)
Anion gap: 12 (ref 5–15)
BUN: 31 mg/dL — ABNORMAL HIGH (ref 8–23)
CO2: 24 mmol/L (ref 22–32)
Calcium: 9.5 mg/dL (ref 8.9–10.3)
Chloride: 101 mmol/L (ref 98–111)
Creatinine, Ser: 1.17 mg/dL — ABNORMAL HIGH (ref 0.44–1.00)
GFR, Estimated: 48 mL/min — ABNORMAL LOW (ref >60)
Glucose, Bld: 113 mg/dL — ABNORMAL HIGH (ref 70–99)
Potassium: 4.2 mmol/L (ref 3.5–5.1)
Sodium: 137 mmol/L (ref 135–145)
Total Bilirubin: 0.7 mg/dL (ref 0.3–1.2)
Total Protein: 7.9 g/dL (ref 6.5–8.1)

## 2021-08-01 LAB — CBC
HCT: 44.2 % (ref 36.0–46.0)
Hemoglobin: 14.1 g/dL (ref 12.0–15.0)
MCH: 28.4 pg (ref 26.0–34.0)
MCHC: 31.9 g/dL (ref 30.0–36.0)
MCV: 89.1 fL (ref 80.0–100.0)
Platelets: 308 10*3/uL (ref 150–400)
RBC: 4.96 MIL/uL (ref 3.87–5.11)
RDW: 14.3 % (ref 11.5–15.5)
WBC: 14 10*3/uL — ABNORMAL HIGH (ref 4.0–10.5)
nRBC: 0 % (ref 0.0–0.2)

## 2021-08-01 LAB — CBG MONITORING, ED: Glucose-Capillary: 95 mg/dL (ref 70–99)

## 2021-08-01 LAB — PROTIME-INR
INR: 1.1 (ref 0.8–1.2)
Prothrombin Time: 13.9 seconds (ref 11.4–15.2)

## 2021-08-01 LAB — URINE DRUG SCREEN, QUALITATIVE (ARMC ONLY)
Amphetamines, Ur Screen: NOT DETECTED
Barbiturates, Ur Screen: NOT DETECTED
Benzodiazepine, Ur Scrn: NOT DETECTED
Cannabinoid 50 Ng, Ur ~~LOC~~: NOT DETECTED
Cocaine Metabolite,Ur ~~LOC~~: NOT DETECTED
MDMA (Ecstasy)Ur Screen: NOT DETECTED
Methadone Scn, Ur: NOT DETECTED
Opiate, Ur Screen: NOT DETECTED
Phencyclidine (PCP) Ur S: NOT DETECTED
Tricyclic, Ur Screen: NOT DETECTED

## 2021-08-01 LAB — SODIUM
Sodium: 135 mmol/L (ref 135–145)
Sodium: 145 mmol/L (ref 135–145)

## 2021-08-01 LAB — APTT: aPTT: 21 seconds — ABNORMAL LOW (ref 24–36)

## 2021-08-01 LAB — ETHANOL: Alcohol, Ethyl (B): <10 mg/dL (ref <10)

## 2021-08-01 MED ORDER — STROKE: EARLY STAGES OF RECOVERY BOOK
Freq: Once | Status: AC
  Filled 2021-08-01: qty 1

## 2021-08-01 MED ORDER — GADOBUTROL 1 MMOL/ML IV SOLN
9.0000 mL | Freq: Once | INTRAVENOUS | Status: AC | PRN
  Administered 2021-08-01: 9 mL via INTRAVENOUS

## 2021-08-01 MED ORDER — ACETAMINOPHEN 160 MG/5ML PO SOLN: 650.0000 mg | ORAL | Status: DC | PRN

## 2021-08-01 MED ORDER — ACETAMINOPHEN 325 MG PO TABS
650.0000 mg | ORAL_TABLET | ORAL | Status: DC | PRN
  Administered 2021-08-03 – 2021-08-07 (×2): 650 mg via ORAL
  Filled 2021-08-01 (×2): qty 2

## 2021-08-01 MED ORDER — IOHEXOL 350 MG/ML SOLN
75.0000 mL | Freq: Once | INTRAVENOUS | Status: AC | PRN
  Administered 2021-08-01: 75 mL via INTRAVENOUS

## 2021-08-01 MED ORDER — CLEVIDIPINE BUTYRATE 0.5 MG/ML IV EMUL
0.0000 mg/h | INTRAVENOUS | Status: DC
  Administered 2021-08-01: 1 mg/h via INTRAVENOUS
  Filled 2021-08-01 (×2): qty 50

## 2021-08-01 MED ORDER — SENNOSIDES-DOCUSATE SODIUM 8.6-50 MG PO TABS: 1.0000 | ORAL_TABLET | Freq: Every evening | ORAL | Status: DC | PRN

## 2021-08-01 MED ORDER — SODIUM CHLORIDE 3 % IV BOLUS
250.0000 mL | Freq: Once | INTRAVENOUS | Status: AC
  Administered 2021-08-01: 250 mL via INTRAVENOUS
  Filled 2021-08-01: qty 500

## 2021-08-01 MED ORDER — SODIUM CHLORIDE 3 % IV BOLUS
250.0000 mL | Freq: Once | INTRAVENOUS | Status: DC
  Filled 2021-08-01: qty 500

## 2021-08-01 MED ORDER — ACETAMINOPHEN 650 MG RE SUPP: 650.0000 mg | RECTAL | Status: DC | PRN

## 2021-08-01 MED ORDER — CLEVIDIPINE BUTYRATE 0.5 MG/ML IV EMUL
0.0000 mg/h | INTRAVENOUS | Status: DC
  Administered 2021-08-02: 9 mg/h via INTRAVENOUS
  Administered 2021-08-02: 8 mg/h via INTRAVENOUS
  Administered 2021-08-02: 4 mg/h via INTRAVENOUS
  Administered 2021-08-02: 10 mg/h via INTRAVENOUS
  Administered 2021-08-02: 4 mg/h via INTRAVENOUS
  Filled 2021-08-01 (×5): qty 50

## 2021-08-01 MED ORDER — SODIUM CHLORIDE 3 % IV SOLN
INTRAVENOUS | Status: DC
  Filled 2021-08-01 (×5): qty 500

## 2021-08-01 MED ORDER — SODIUM CHLORIDE 3 % IV SOLN: INTRAVENOUS | Status: DC

## 2021-08-01 MED ORDER — SODIUM CHLORIDE 3 % IV SOLN
INTRAVENOUS | Status: DC
  Filled 2021-08-01 (×2): qty 500

## 2021-08-01 NOTE — ED Notes (Signed)
Report given to Verl Dicker, RN at [facility] [phone].

## 2021-08-01 NOTE — ED Notes (Signed)
Patient at MRI 

## 2021-08-01 NOTE — Consult Note (Signed)
MEDICATION RELATED CONSULT  Pharmacy Consult for 3% NaCl Monitoring Indication: Cerebral edema  Patient Measurements: Height: 5\' 3"  (160 cm) Weight: 93.4 kg (206 lb) IBW/kg (Calculated) : 52.4  Vital Signs: Temp: 98.1 F (36.7 C) (01/12 1017) Temp Source: Oral (01/12 1017) BP: 141/72 (01/12 1300) Pulse Rate: 68 (01/12 1300) Intake/Output from previous day: No intake/output data recorded. Intake/Output from this shift: No intake/output data recorded.  Estimated Creatinine Clearance: 44.4 mL/min (A) (by C-G formula based on SCr of 1.17 mg/dL (H)).    Medications:  3% NaCl 250 cc bolus over 30 minutes followed by continuous infusion at 75 cc/hr  Assessment: Patient is a 76 y/o F with medical history including T2DM, HLD, morbid obesity, HTN, hepatic steatosis, GERD who presented to the ED 1/12 with left sided weakness and slurred speech. Son reported symptoms ongoing since Tuesday 1/10. Brain MRI with subacute infarcts with evidence of hemorrhagic transformation and associated edema with mild regional mass effect. Patient to be started on hypertonic saline in the ED. Pharmacy consult to assist with monitoring.   Goal of Therapy:  Na 150-155 mmol/L  Plan:  --Will continue to follow along with sodium checks q6h --If higher rate of infusion required or prolonged therapy anticipated, central line should be placed --Infusion should be stopped if level > 160 mmol/L  08-19-1980 08/01/2021,2:52 PM

## 2021-08-01 NOTE — Progress Notes (Signed)
Pt passed [employer] Screen with me at bed side.

## 2021-08-01 NOTE — ED Provider Notes (Signed)
[facility] Provider Note    Event Date/Time   First MD Initiated Contact with Patient 08/01/21 1011     (approximate)   History   CVA  HPI  Alyssa Mayo is a 76 y.o. female with history of diabetes hypertension hyperlipidemia but no previous history of CVA presents to the ER for left-sided weakness.  Last seen normal was Tuesday.  She denies any pain or headache.  Arrives via EMS glucose was normal.  Normotensive no fever.  She nods her head yes when asked if symptoms started around Tuesday.  Not able to provide much additional history.  Son at bedside stating last seen normal was Tuesday had been on some cough syrup with codeine and was found to be slightly confused and altered which she has been without medication in the past but found her today more drowsy and on the floor with left-sided weakness.     Physical Exam   Triage Vital Signs: ED Triage Vitals  Enc Vitals Group     BP      Pulse      Resp      Temp      Temp src      SpO2      Weight      Height      Head Circumference      Peak Flow      Pain Score      Pain Loc      Pain Edu?      Excl. in [place]?     Most recent vital signs: Vitals:   08/01/21 1300 08/01/21 1447  BP: (!) 141/72 (!) 156/66  Pulse: 68 86  Resp: (!) 23 (!) 25  Temp:    SpO2: 97% 96%     Constitutional: Alert  Eyes: Conjunctivae are normal.  Head: Atraumatic. Nose: No congestion/rhinnorhea. Mouth/Throat: Mucous membranes are moist.   Neck: Painless ROM.  Cardiovascular:   Good peripheral circulation. Respiratory: Normal respiratory effort.  No retractions.  Gastrointestinal: Soft and nontender.  Musculoskeletal:  no deformity Neurologic:  good right sided strenght,  unable to hold lue or lle up against gravity.  + left drift. Left facial droop, rightward gaze preference Skin:  Skin is warm, dry and intact. No rash noted. Psychiatric: calm and cooperative    ED Results / Procedures / Treatments    Labs (all labs ordered are listed, but only abnormal results are displayed) Labs Reviewed  APTT - Abnormal; Notable for the following components:      Result Value   aPTT 21 (*)    All other components within normal limits  CBC - Abnormal; Notable for the following components:   WBC 14.0 (*)    All other components within normal limits  DIFFERENTIAL - Abnormal; Notable for the following components:   Neutro Abs 11.0 (*)    All other components within normal limits  COMPREHENSIVE METABOLIC PANEL - Abnormal; Notable for the following components:   Glucose, Bld 113 (*)    BUN 31 (*)    Creatinine, Ser 1.17 (*)    AST 14 (*)    GFR, Estimated 48 (*)    All other components within normal limits  RESP PANEL BY RT-PCR (FLU A&B, COVID) ARPGX2  ETHANOL  PROTIME-INR  URINE DRUG SCREEN, QUALITATIVE ([facility] ONLY)  URINALYSIS, ROUTINE W REFLEX MICROSCOPIC  SODIUM  SODIUM  SODIUM     EKG  ED ECG REPORT I, Willy Eddy, the attending physician, personally  viewed and interpreted this ECG.   Date: 08/01/2021  EKG Time: 10:16  Rate: cva  Rhythm: sinus  Axis: normal  Intervals: normal intervals  ST&T Change: no stemi, no depression    RADIOLOGY Please see ED Course for my review and interpretation.  I personally reviewed all radiographic images ordered to evaluate for the above acute complaints and reviewed radiology reports and findings.  These findings were personally discussed with the patient.  Please see medical record for radiology report.    PROCEDURES:  Critical Care performed: Yes, see critical care procedure note(s)  .Critical Care Performed by: Willy Eddy, MD Authorized by: Willy Eddy, MD   Critical care provider statement:    Critical care time (minutes):  35   Critical care was necessary to treat or prevent imminent or life-threatening deterioration of the following conditions:  CNS failure or compromise   Critical care was time spent  personally by me on the following activities:  Ordering and performing treatments and interventions, ordering and review of laboratory studies, ordering and review of radiographic studies, pulse oximetry, re-evaluation of patient's condition, review of old charts, obtaining history from patient or surrogate, examination of patient, evaluation of patient's response to treatment, discussions with primary provider, discussions with consultants and development of treatment plan with patient or surrogate   MEDICATIONS ORDERED IN ED: Medications  sodium chloride 3% (hypertonic) IV bolus 250 mL (has no administration in time range)  sodium chloride (hypertonic) 3 % solution (has no administration in time range)  gadobutrol (GADAVIST) 1 MMOL/ML injection 9 mL (9 mLs Intravenous Contrast Given 08/01/21 1351)     IMPRESSION / MDM / ASSESSMENT AND PLAN / ED COURSE  I reviewed the triage vital signs and the nursing notes.                              Differential diagnosis includes, but is not limited to, cva, tia, hypoglycemia, dehydration, electrolyte abnormality, dissection, sepsis  Patient with presenting symptoms concerning for CVA.  She is outside of the window for tPA or TNKase as she is her last seen normal was on Tuesday.  Has fairly dense left-sided weakness and rightward gaze preference.  Taken emergently to CT head to evaluate for bleed.  On cardiac monitor is normal sinus rhythm but does have risk factors for CVA.  Glucose normal.  Does not appear septic.  The patient will be placed on continuous pulse oximetry and telemetry for monitoring.  Laboratory evaluation will be sent to evaluate for the above complaints.     Clinical Course as of 08/01/21 1530  Thu Aug 01, 2021  1123 Ct head showing evidence of mass lesion by my review.  No subdural.  Will await formal radiology report.  Will order MRI [PR]  1143 I consulted with Dr. Thomasena Edis of neurology who is recommended MRI with and without  possible neurosurgical consultation. [PR]  1416 Dr. Thomasena Edis currently at bedside evaluating patient.  MRI shows evidence of hemorrhagic transformation of subacute infarct. [PR]  1453 Dr. Thomasena Edis has recommended hypertonic saline 250 bolus and infusion is 75 mL per hour.  [name] has also recommended tight blood pressure control as systolic less than 40.  Cleviprex ordered.  Has recommended transfer to neuro ICU.  Have paged [facility]. [PR]  1529 Case discussed with Dr. Otelia Limes of neuro ICU at [facility].  He has excepted patient to his service but due to limited bed capacity at this time  there is not a bed available he has requested patient to be admitted to ICU here at [facility] pending bed availability if that is an option.  Patient remains hemodynamically stable at this time with hypertonic infusion infusing.  Family up-to-date on plan. [PR]    Clinical Course User Index [PR] Willy Eddy, MD     FINAL CLINICAL IMPRESSION(S) / ED DIAGNOSES   Final diagnoses:  Cerebrovascular accident (CVA), unspecified mechanism (HCC)     Rx / DC Orders   ED Discharge Orders     None        Note:  This document was prepared using Dragon voice recognition software and may include unintentional dictation errors.    Willy Eddy, MD 08/01/21 1455

## 2021-08-01 NOTE — ED Notes (Signed)
Called [facility] for transfer  1450

## 2021-08-01 NOTE — ED Notes (Signed)
Accepted to [facility] by Dr. Otelia Limes  waiting for bed assignment  [phone]

## 2021-08-01 NOTE — Consult Note (Addendum)
Neurology H&P  Alyssa Mayo MR# [id number] 08/01/2021  CC: found down with AMS  History is obtained from: Son  HPI: Alyssa Mayo is a 76 y.o. female right handed recent URI with cough found down by her son this morning.   She has a sensitivity to codeine usually groggy/foggy for ~3 days and took cough medicine with same and had not been herself since Tuesday. She neglected to take her medications for DM2 and her son thought it was just the codeine. During this time, she had hyperglycemia ~235 she did not eat that day and the following day was ~96 which was unusual for her. Her condition did not improve until he found her down.  He went to bed last night at 2300 and she was still up and had just taken a shower.  LKW: 2300 tpa given?: No, ICH IR Thrombectomy? No, ICH Modified Rankin Scale: 0-Completely asymptomatic and back to baseline post- stroke NIHSS: 7  LOC Responsiveness 1 LOC Questions 2 LOC Commands 0 Horizontal eye movement 0 Visual field 0 Facial palsy 1 Motor arm - Right arm 0 Motor arm - Left arm 1 Motor leg - Right leg 0 Motor leg - Left leg 1 Limb ataxia 0 Sensory test 0 Language 0 Speech 1 Extinction and inattention 0  ROS: A complete ROS was performed and is negative except as noted in the HPI.    Past Medical History:  Diagnosis Date   Abnormal levels of other serum enzymes 07/17/2015   Arthritis    Constipation 07/11/2015   COVID-19 03/06/2021   Diabetes mellitus without complication (HCC)    Elevated liver enzymes    Fatty liver    Generalized abdominal pain 07/11/2015   Hyperlipidemia    Hypertension    Lifelong obesity    Macular degeneration    Morbid obesity due to excess calories ([place]) 07/11/2015   Other fatigue 07/11/2015   Sleep apnea    Spinal headache    Family History  Problem Relation Age of Onset   Cancer Mother        breast   Heart disease Mother    Stroke Mother    Diabetes Mother    Colon cancer Mother     Cancer Father        leukemia   Stroke Father    Leukemia Father    Ovarian cancer Sister    Diabetes Brother    Cancer Brother    Stroke Maternal Grandfather    Dementia Paternal Grandmother    COPD Neg Hx    Hypertension Neg Hx    Social History:  reports that she has never smoked. She has never used smokeless tobacco. She reports that she does not currently use alcohol. She reports that she does not use drugs.   Prior to Admission medications   Medication Sig Start Date End Date Taking? Authorizing Provider  albuterol (VENTOLIN HFA) 108 (90 Base) MCG/ACT inhaler Inhale 1-2 puffs into the lungs every 6 (six) hours as needed for wheezing or shortness of breath. 07/23/21   Danton Clap, PA-C  aspirin EC 81 MG tablet Take 1 tablet (81 mg total) by mouth daily. Take at least one hour BEFORE the Celebrex 07/11/15   Lada, Satira Anis, MD  azithromycin (ZITHROMAX) 250 MG tablet Take 1 tablet (250 mg total) by mouth daily. Take first 2 tablets together, then 1 every day until finished. 07/23/21   Laurene Footman B, PA-C  celecoxib (CELEBREX) 200 MG capsule Take 1  capsule (200 mg total) by mouth daily. 12/04/19   Volney American, PA-C  Cholecalciferol 25 MCG (1000 UT) tablet Take 1,000 Units by mouth daily.    [provider]  dapagliflozin propanediol (FARXIGA) 10 MG TABS tablet Take 10 mg by mouth daily. 12/04/19   Volney American, PA-C  gabapentin (NEURONTIN) 100 MG capsule Take by mouth daily. At bed time 05/16/19   [provider]  glipiZIDE (GLUCOTROL XL) 5 MG 24 hr tablet Take by mouth. 10/08/17 12/16/18  [provider]  glipiZIDE (GLUCOTROL XL) 5 MG 24 hr tablet Take 5 mg by mouth daily with breakfast.    [provider]  glucose blood (ONE TOUCH ULTRA TEST) test strip CHECK BLOOD SUGAR AS DIRECTED. 04/09/18   Johnson, Megan P, DO  guaiFENesin-codeine (CHERATUSSIN AC) 100-10 MG/5ML syrup Take 10 mLs by mouth 3 (three) times daily as needed for  cough. 07/23/21   Danton Clap, PA-C  metFORMIN (GLUCOPHAGE) 500 MG tablet Take 2 tablets (1,000 mg total) by mouth 2 (two) times daily. 12/04/19   Volney American, PA-C  Multiple Vitamins-Minerals (PRESERVISION AREDS 2) CAPS Take 2 tablets by mouth daily. 04/05/18   [provider]  pantoprazole (PROTONIX) 40 MG tablet Take 1 tablet (40 mg total) by mouth daily. 12/04/19   Volney American, PA-C  simvastatin (ZOCOR) 40 MG tablet Take 1 tablet (40 mg total) by mouth daily. 12/04/19   Volney American, PA-C   Exam: Current vital signs: BP (!) 156/66    Pulse 86    Temp 98.1 F (36.7 C) (Oral)    Resp (!) 25    Ht 5\' 3"  (1.6 m)    Wt 93.4 kg    SpO2 96%    BMI 36.49 kg/m   Physical Exam  Appears well-developed and well-nourished.  Psych: Affect appropriate to situation Eyes: No scleral injection HENT: No OP obstrucion Head: Normocephalic.  Cardiovascular: Normal rate and regular rhythm.  Respiratory: Effort normal and breath sounds normal to anterior ascultation GI: Soft.  No distension. There is no tenderness.  Skin: WDI  Neuro: Mental Status: Patient is eyes closed but awake, alert, oriented to person, place, year, and situation. Patient is not completely able to give clear and coherent history. Speech hypophonic with bradylalia but reasonably fluent, intact comprehension and repetition. No neglect. Visual Fields are full. Pupils are equal, round, and reactive to light. EMOI with head turn but resists eye opening.  Facial sensation is symmetric to light touch. Facial movement is asymmetric mild left lower weakness.  Hearing is intact to voice. Uvula midline, palate elevates symmetrically. Shoulder shrug is symmetric. Tongue is midline without atrophy or fasciculations.  Tone is normal. Bulk is normal. ~4/5 strength left upper and lower extremities.  Sensation is symmetric to light touch but difficulty feeling warm/hot in the left arm and leg. Deep  Tendon Reflexes: 2+ and symmetric in the biceps and patellae. Babinski (+)R Gait - Deferred  I have reviewed labs in epic and the pertinent results are: Glu 113  I have reviewed the images obtained: MRI showed extensive diffusion restriction throughout much of the MCA territory including the insula, anterior frontal lobe, lentiform nucleus, caudate body, extending into the high right frontal and parietal lobe cortices with associated FLAIR abnormality and edema consistent with subacute infarcts. There is extensive SWI signal dropout consistent with hemorrhagic transformation. There is no enhancement to suggest solid underlying mass lesion.  Assessment: Alyssa Mayo is a 76 y.o. 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 Patient stated she does not have HTN and only DM 2. Transformation may likely be in setting of episode of hyperglycemia exacerbated by aspirin.  Impression:  Early subacute right MCA   Plan: Elevate head of bed head midline.  Blood pressure: MAP >65 SBP SBP<140: - Start nicardipine infusion may increase to maximum 34mcg/min as needed to maintain . - Labetalol 20mg  every 10 minutes as needed if SBP>140.  3% Na 250cc bolus followed by 75cc/hr infusion - Goal serum Na 145-155. Serum Na every 6 hours. X-ray chest STAT. CTA head and neck.   Transfer to [facility] for [facility] admission.  This patient is critically ill and at significant risk of neurological worsening, death and care requires constant monitoring of vital signs, hemodynamics,respiratory and cardiac monitoring, neurological assessment, discussion with family, other specialists and medical decision making of high complexity. I spent 73 minutes of neurocritical care time  in the care of  this patient. This was time spent independent of any time provided by nurse practitioner or PA.  Electronically signed by:  Lynnae Sandhoff,  MD Page: [id number] 08/01/2021, 2:59 PM

## 2021-08-01 NOTE — ED Triage Notes (Signed)
Patient to ED via [facility] from [facility] for left sided weakness and slurred speech. Per son, symptoms started Tuesday. Patient alert at this time.

## 2021-08-01 NOTE — ED Notes (Signed)
Attempted to call report to [facility], Nurse unavailable to take report.

## 2021-08-01 NOTE — H&P (Signed)
Neurology history and physical   CC: Altered mental status  History is obtained from: Chart review  HPI: Alyssa Mayo is a 76 y.o. female past medical history of diabetes, hypertension, hyperlipidemia, obesity, sleep apnea, presented to the emergency room at [facility] after being found down by her son.  Last known well is about 3 days ago or at best 11 PM yesterday night-very unclear from chart review. It seems like she had been suffering with some upper respiratory symptoms, took some cough medicine and has not been like herself since Tuesday.  She also had not taken her diabetes medications.  She was found to be mildly hyperglycemic with blood sugars in the upper 200s.  She was found down this morning and brought into the hospital for further evaluation. She is unable to provide reliable history.   LKW: Best 11 PM yesterday 07/31/2021 or 3 days ago tpa given?: no, HT on acute ischemic stroke Premorbid modified Rankin scale (mRS): 0-as an initial consult note from Dr. Theda Sers  ROS: Unable to obtain due to altered mental status.   Past Medical History:  Diagnosis Date   Abnormal levels of other serum enzymes 07/17/2015   Arthritis    Constipation 07/11/2015   COVID-19 03/06/2021   Diabetes mellitus without complication (HCC)    Elevated liver enzymes    Fatty liver    Generalized abdominal pain 07/11/2015   Hyperlipidemia    Hypertension    Lifelong obesity    Macular degeneration    Morbid obesity due to excess calories ([place]) 07/11/2015   Other fatigue 07/11/2015   Sleep apnea    Spinal headache    Family History  Problem Relation Age of Onset   Cancer Mother        breast   Heart disease Mother    Stroke Mother    Diabetes Mother    Colon cancer Mother    Cancer Father        leukemia   Stroke Father    Leukemia Father    Ovarian cancer Sister    Diabetes Brother    Cancer Brother    Stroke Maternal Grandfather    Dementia Paternal  Grandmother    COPD Neg Hx    Hypertension Neg Hx    Social History:   reports that she has never smoked. She has never used smokeless tobacco. She reports that she does not currently use alcohol. She reports that she does not use drugs.  Medications  Current Facility-Administered Medications:     stroke: mapping our early stages of recovery book, , Does not apply, Once, Amie Portland, MD   acetaminophen (TYLENOL) tablet 650 mg, 650 mg, Oral, Q4H PRN **OR** acetaminophen (TYLENOL) 160 MG/5ML solution 650 mg, 650 mg, Per Tube, Q4H PRN **OR** acetaminophen (TYLENOL) suppository 650 mg, 650 mg, Rectal, Q4H PRN, Amie Portland, MD   clevidipine (CLEVIPREX) infusion 0.5 mg/mL, 0-32 mg/hr, Intravenous, Continuous, Amie Portland, MD   senna-docusate (Senokot-S) tablet 1 tablet, 1 tablet, Oral, QHS PRN, Amie Portland, MD   sodium chloride (hypertonic) 3 % solution, , Intravenous, Continuous, Amie Portland, MD  Exam: Current vital signs: There were no vitals taken for this visit. Vital signs in last 24 hours: Temp:  [98.1 F (36.7 C)-99 F (37.2 C)] 99 F (37.2 C) (01/12 1958) Pulse Rate:  [68-104] 99 (01/12 1958) Resp:  [13-26] 20 (01/12 1958) BP: (116-160)/(54-102) 155/83 (01/12 1958) SpO2:  [92 %-99 %] 94 % (01/12 1958) Weight:  [93.4 kg] 93.4  kg (01/12 1015) GENERAL: Awake, alert in NAD HEENT: - Normocephalic and atraumatic, dry mm, no LN++, no Thyromegally LUNGS - Clear to auscultation bilaterally with no wheezes CV - S1S2 RRR, no m/r/g, equal pulses bilaterally. ABDOMEN - Soft, nontender, nondistended with normoactive BS Ext: warm, well perfused, intact peripheral pulses  NEURO:  Mental Status: Awake alert oriented to the fact that she is in the hospital, could not get me the correct month but told me her correct age. Language: speech is hypophonic and mildly dysarthric.  Naming, repetition, fluency, and comprehension intact. Cranial Nerves: PERRL EOMI, visual fields full, mild  left-sided facial asymmetry,facial sensation intact, hearing intact, tongue/uvula/soft palate midline, normal sternocleidomastoid and trapezius muscle strength. No evidence of tongue atrophy or fibrillations Motor: Not much of a drift but definitely 4+/5 strength in the left compared to 5/5 on the right. No dysmetria  NIHSS 1a Level of Conscious.: 0 1b LOC Questions: 1 1c LOC Commands: 0 2 Best Gaze: 0 3 Visual: 0 4 Facial Palsy: 1 5a Motor Arm - left: 0 5b Motor Arm - Right: 0 6a Motor Leg - Left: 0 6b Motor Leg - Right: 0 7 Limb Ataxia: 0 8 Sensory: 0 9 Best Language: 0 10 Dysarthria: 1 11 Extinct. and Inatten.: 0 TOTAL: 3  Labs I have reviewed labs in epic and the results pertinent to this consultation are:  CBC    Component Value Date/Time   WBC 14.0 (H) 08/01/2021 1019   RBC 4.96 08/01/2021 1019   HGB 14.1 08/01/2021 1019   HGB 12.6 11/23/2019 1127   HCT 44.2 08/01/2021 1019   HCT 40.4 11/23/2019 1127   PLT 308 08/01/2021 1019   PLT 278 11/23/2019 1127   MCV 89.1 08/01/2021 1019   MCV 88 11/23/2019 1127   MCH 28.4 08/01/2021 1019   MCHC 31.9 08/01/2021 1019   RDW 14.3 08/01/2021 1019   RDW 14.9 11/23/2019 1127   LYMPHSABS 2.2 08/01/2021 1019   LYMPHSABS 1.8 11/23/2019 1127   MONOABS 0.7 08/01/2021 1019   EOSABS 0.1 08/01/2021 1019   EOSABS 0.2 11/23/2019 1127   BASOSABS 0.1 08/01/2021 1019   BASOSABS 0.1 11/23/2019 1127    CMP     Component Value Date/Time   NA 135 08/01/2021 1458   NA 143 11/23/2019 1127   K 4.2 08/01/2021 1019   CL 101 08/01/2021 1019   CO2 24 08/01/2021 1019   GLUCOSE 113 (H) 08/01/2021 1019   BUN 31 (H) 08/01/2021 1019   BUN 18 11/23/2019 1127   CREATININE 1.17 (H) 08/01/2021 1019   CALCIUM 9.5 08/01/2021 1019   PROT 7.9 08/01/2021 1019   PROT 6.6 11/23/2019 1127   ALBUMIN 3.8 08/01/2021 1019   ALBUMIN 4.2 11/23/2019 1127   AST 14 (L) 08/01/2021 1019   AST 15 06/29/2018 0953   ALT 16 08/01/2021 1019   ALT 18 06/29/2018  0953   ALKPHOS 124 08/01/2021 1019   BILITOT 0.7 08/01/2021 1019   BILITOT 0.2 11/23/2019 1127   GFRNONAA 48 (L) 08/01/2021 1019   GFRAA 74 11/23/2019 1127    Lipid Panel     Component Value Date/Time   CHOL 162 11/23/2019 1127   CHOL 178 06/29/2018 0953   TRIG 258 (H) 11/23/2019 1127   TRIG 141 06/29/2018 0953   HDL 42 11/23/2019 1127   CHOLHDL 4.2 12/17/2016 1406   VLDL 28 06/29/2018 0953   LDLCALC 78 11/23/2019 1127  COVID test negative Influenza test negative  Chest x-ray with left basilar  atelectasis.  Imaging I have reviewed the images obtained: CT concerning for infarct versus mass in the right MCA territory. MRI of the brain with extensive restricted diffusion throughout much of the right MCA territory with associated changes of hemorrhagic transformation and localized cerebral edema and mass-effect.. CT angiogram with nonocclusive filling defect in the right MCA bifurcation and irregular and diminished flow in the inferior division of right M2.  Assessment: 76 year old woman with a moderate to large right MCA infarct with petechial hemorrhage with last known well outside the window for intervention and due to establish stroke and hemorrhagic transformation, not a candidate for IV thrombolysis or endovascular thrombectomy. Her CT angiogram does show a nonocclusive filling defect in the right MCA bifurcation with irregular and diminished flow in the inferior division of the right M2. Cardioembolic versus atheroembolic etiology needs further investigation  Impression Acute ischemic stroke involving the right MCA territory with hemorrhagic transformation Hypertensive urgency   Recommendations: Admit to ICU Blood pressure goal systolic less than 0000000 Lovenox as needed She started on 3% at 75 cc an hour-continue for now with goal sodium of 1 50-1 55. Repeat head CT in the morning 2D echo A1c Lipid panel Frequent neurochecks Stat CT head in case of any  neurological worsening PT OT speech therapy Bedside swallow evaluation Check labs including CBC BMP.  Replete electrolytes as necessary. Had leukocytosis on arrival-WBC count of 14. Will check urinalysis and chest x-ray for probable aspiration pneumonia versus UTI leading to this.  Her COVID and flu tests have been negative.  Initial chest x-ray with left basilar atelectasis.  We will follow-up with a chest x-ray in the morning.  No antibiotics for now Given hemorrhagic transformation-no aspirin or Plavix or anticoagulants. Mildly elevated creatinine.  Recheck labs in the morning. SCDs for DVT prophylaxis Full code   -- Amie Portland, MD Neurologist [facility] Pager: [phone]  CRITICAL CARE ATTESTATION Performed by: Amie Portland, MD Total critical care time: 48 minutes Critical care time was exclusive of separately billable procedures and treating other patients and/or supervising APPs/Residents/Students Critical care was necessary to treat or prevent imminent or life-threatening deterioration due to acute ischemic stroke with hemorrhagic transformation, hypertensive urgency. This patient is critically ill and at significant risk for neurological worsening and/or death and care requires constant monitoring. Critical care was time spent personally by me on the following activities: development of treatment plan with patient and/or surrogate as well as nursing, discussions with consultants, evaluation of patient's response to treatment, examination of patient, obtaining history from patient or surrogate, ordering and performing treatments and interventions, ordering and review of laboratory studies, ordering and review of radiographic studies, pulse oximetry, re-evaluation of patient's condition, participation in multidisciplinary rounds and medical decision making of high complexity in the care of this patient.

## 2021-08-01 NOTE — ED Notes (Signed)
Pt. Accepted to [facility] waiting on Bed Assignment

## 2021-08-01 NOTE — ED Notes (Signed)
[place] reviewed by this RN ?

## 2021-08-02 ENCOUNTER — Inpatient Hospital Stay (HOSPITAL_COMMUNITY): Payer: Medicare HMO

## 2021-08-02 DIAGNOSIS — I6389 Other cerebral infarction: Secondary | ICD-10-CM | POA: Diagnosis not present

## 2021-08-02 DIAGNOSIS — G936 Cerebral edema: Secondary | ICD-10-CM

## 2021-08-02 DIAGNOSIS — I639 Cerebral infarction, unspecified: Secondary | ICD-10-CM

## 2021-08-02 DIAGNOSIS — I160 Hypertensive urgency: Secondary | ICD-10-CM

## 2021-08-02 LAB — CBC
HCT: 44.8 % (ref 36.0–46.0)
Hemoglobin: 13.8 g/dL (ref 12.0–15.0)
MCH: 28 pg (ref 26.0–34.0)
MCHC: 30.8 g/dL (ref 30.0–36.0)
MCV: 90.9 fL (ref 80.0–100.0)
Platelets: 312 10*3/uL (ref 150–400)
RBC: 4.93 MIL/uL (ref 3.87–5.11)
RDW: 14.3 % (ref 11.5–15.5)
WBC: 13.9 10*3/uL — ABNORMAL HIGH (ref 4.0–10.5)
nRBC: 0 % (ref 0.0–0.2)

## 2021-08-02 LAB — LIPID PANEL
Cholesterol: 182 mg/dL (ref 0–200)
HDL: 31 mg/dL — ABNORMAL LOW (ref >40)
LDL Cholesterol: 93 mg/dL (ref 0–99)
Total CHOL/HDL Ratio: 5.9 RATIO
Triglycerides: 289 mg/dL — ABNORMAL HIGH (ref <150)
VLDL: 58 mg/dL — ABNORMAL HIGH (ref 0–40)

## 2021-08-02 LAB — ECHOCARDIOGRAM COMPLETE
AR max vel: 2.03 cm2
AV Peak grad: 23.4 mmHg
Ao pk vel: 2.42 m/s
Area-P 1/2: 3.79 cm2
Height: 63 in
S' Lateral: 4.1 cm
Weight: 3296 oz

## 2021-08-02 LAB — VAS US LOWER EXTREMITY VENOUS (DVT)
AR max vel: 2.03 cm2
AV Peak grad: 23.4 mmHg
Ao pk vel: 2.42 m/s
Area-P 1/2: 3.79 cm2
S' Lateral: 4.1 cm

## 2021-08-02 LAB — BASIC METABOLIC PANEL
Anion gap: 13 (ref 5–15)
BUN: 26 mg/dL — ABNORMAL HIGH (ref 8–23)
CO2: 17 mmol/L — ABNORMAL LOW (ref 22–32)
Calcium: 8.9 mg/dL (ref 8.9–10.3)
Chloride: 111 mmol/L (ref 98–111)
Creatinine, Ser: 1.05 mg/dL — ABNORMAL HIGH (ref 0.44–1.00)
GFR, Estimated: 55 mL/min — ABNORMAL LOW (ref >60)
Glucose, Bld: 98 mg/dL (ref 70–99)
Potassium: 4.3 mmol/L (ref 3.5–5.1)
Sodium: 141 mmol/L (ref 135–145)

## 2021-08-02 LAB — HEMOGLOBIN A1C
Hgb A1c MFr Bld: 7.4 % — ABNORMAL HIGH (ref 4.8–5.6)
Mean Plasma Glucose: 165.68 mg/dL

## 2021-08-02 MED ORDER — LISINOPRIL 20 MG PO TABS
40.0000 mg | ORAL_TABLET | Freq: Every day | ORAL | Status: DC
  Administered 2021-08-02 – 2021-08-06 (×5): 40 mg via ORAL
  Filled 2021-08-02 (×6): qty 2

## 2021-08-02 MED ORDER — PANTOPRAZOLE SODIUM 40 MG PO TBEC
40.0000 mg | DELAYED_RELEASE_TABLET | Freq: Every day | ORAL | Status: DC
  Administered 2021-08-02 – 2021-08-07 (×6): 40 mg via ORAL
  Filled 2021-08-02 (×6): qty 1

## 2021-08-02 MED ORDER — LABETALOL HCL 5 MG/ML IV SOLN: 10.0000 mg | INTRAVENOUS | Status: DC | PRN

## 2021-08-02 MED ORDER — ATORVASTATIN CALCIUM 10 MG PO TABS
20.0000 mg | ORAL_TABLET | Freq: Every day | ORAL | Status: DC
  Administered 2021-08-03 – 2021-08-07 (×5): 20 mg via ORAL
  Filled 2021-08-02 (×6): qty 2

## 2021-08-02 MED ORDER — AMLODIPINE BESYLATE 10 MG PO TABS
10.0000 mg | ORAL_TABLET | Freq: Every day | ORAL | Status: DC
  Administered 2021-08-02 – 2021-08-06 (×5): 10 mg via ORAL
  Filled 2021-08-02 (×6): qty 1

## 2021-08-02 MED ORDER — CHLORHEXIDINE GLUCONATE CLOTH 2 % EX PADS
6.0000 | MEDICATED_PAD | Freq: Every day | CUTANEOUS | Status: DC
  Administered 2021-08-02 – 2021-08-03 (×2): 6 via TOPICAL

## 2021-08-02 MED ORDER — ALBUTEROL SULFATE (2.5 MG/3ML) 0.083% IN NEBU: 2.5000 mg | INHALATION_SOLUTION | Freq: Four times a day (QID) | RESPIRATORY_TRACT | Status: DC | PRN

## 2021-08-02 MED ORDER — SODIUM CHLORIDE 0.9 % IV SOLN
INTRAVENOUS | Status: DC
Start: 2021-08-02 — End: 2021-08-03

## 2021-08-02 MED ORDER — SIMVASTATIN 20 MG PO TABS
40.0000 mg | ORAL_TABLET | Freq: Every day | ORAL | Status: DC
  Administered 2021-08-02: 40 mg via ORAL
  Filled 2021-08-02: qty 2

## 2021-08-02 NOTE — TOC CAGE-AID Note (Signed)
Transition of Care [facility]) - CAGE-AID Screening   Patient Details  Name: Taima Rada MRN: [id number] Date of Birth: [birth year]  Transition of Care [facility]) CM/SW Contact:    Leander Tout C Tarpley-Spilde, LCSWA Phone Number: 08/02/2021, 1:50 PM   Clinical Narrative: Pt is unable to participate in Cage Aid.  Maddy Graham Tarpley-[place]i, MSW, LCSW-A Pronouns:  She/Her/Hers [place] Transitions of Care Clinical Social Worker Direct Number:  [phone] [email or web address]  CAGE-AID Screening: Substance Abuse Screening unable to be completed due to: : Patient unable to participate             Substance Abuse Education Offered: No

## 2021-08-02 NOTE — Progress Notes (Signed)
Echocardiogram 2D Echocardiogram has been performed.  Alyssa Mayo 08/02/2021, 10:27 AM

## 2021-08-02 NOTE — Evaluation (Signed)
Physical Therapy Evaluation Patient Details Name: Alyssa Mayo MRN: [id number] DOB: December 25, 1945 Today's Date: 08/02/2021  History of Present Illness  76 y/o female presented to ED on 08/01/21 after being found down with L sided weakness and slurred speech. 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 CTA showed nonocclusive filling defect in R MCA bifurcation and irregular and diminished flow in inferior division of R M2. PMH: HTN, obesity, macular degeneration, diabetes, sleep apnea  Clinical Impression  Patient admitted with above diagnosis. Patient presents with generalized weakness, impaired balance, decreased activity tolerance, and impaired cognition. Patient requires minA for sit to stand transfer and min guard for short ambulation distance with HHA. Patient lethargic throughout session. Patient will benefit from skilled PT services during acute stay to address listed deficits. Recommend HHPT at discharge to maximize functional independence and safety.        Recommendations for follow up therapy are one component of a multi-disciplinary discharge planning process, led by the attending physician.  Recommendations may be updated based on patient status, additional functional criteria and insurance authorization.  Follow Up Recommendations Home health PT    Assistance Recommended at Discharge Frequent or constant Supervision/Assistance  Patient can return home with the following  A little help with walking and/or transfers;A little help with bathing/dressing/bathroom;Help with stairs or ramp for entrance;Direct supervision/assist for financial management;Direct supervision/assist for medications management;Assist for transportation    Equipment Recommendations None recommended by PT  Recommendations for Other Services       Functional Status Assessment Patient has had a recent decline in their functional status and demonstrates  the ability to make significant improvements in function in a reasonable and predictable amount of time.     Precautions / Restrictions Precautions Precautions: Fall Precaution Comments: SBP<160 Restrictions Weight Bearing Restrictions: No      Mobility  Bed Mobility Overal bed mobility: Needs Assistance Bed Mobility: Supine to Sit     Supine to sit: Min assist          Transfers Overall transfer level: Needs assistance Equipment used: 1 person hand held assist Transfers: Sit to/from Stand Sit to Stand: Min assist   Step pivot transfers: Min assist       General transfer comment: minA for boost up into standing    Ambulation/Gait Ambulation/Gait assistance: Min guard Gait Distance (Feet): 25 Feet Assistive device: 1 person hand held assist Gait Pattern/deviations: Step-through pattern;Decreased stride length Gait velocity: decreased     General Gait Details: min guard for safety. Patient reliant on single UE support but no overt LOB  Stairs            Wheelchair Mobility    Modified Rankin (Stroke Patients Only) Modified Rankin (Stroke Patients Only) Pre-Morbid Rankin Score: Slight disability Modified Rankin: Moderately severe disability     Balance Overall balance assessment: Needs assistance Sitting-balance support: Feet supported Sitting balance-Leahy Scale: Fair     Standing balance support: Single extremity supported;During functional activity Standing balance-Leahy Scale: Fair                               Pertinent Vitals/Pain Pain Assessment: Faces Faces Pain Scale: No hurt Pain Intervention(s): Monitored during session    Home Living Family/patient expects to be discharged to:: Private residence Living Arrangements: Children Available Help at Discharge: Family;Available 24 hours/day Type of Home: House Home Access: Stairs to enter Entrance Stairs-Rails: Can reach  both;Left;Right Entrance Stairs-Number of Steps:  4   Home Layout: One level Home Equipment: BSC/3in1;Cane - single point      Prior Function Prior Level of Function : Driving;Independent/Modified Independent             Mobility Comments: uses cane for mobility ADLs Comments: No assist with ADL/IADL     Hand Dominance   Dominant Hand: Right    Extremity/Trunk Assessment   Upper Extremity Assessment Upper Extremity Assessment: Defer to OT evaluation LUE Coordination: decreased fine motor;decreased gross motor    Lower Extremity Assessment Lower Extremity Assessment: Generalized weakness    Cervical / Trunk Assessment Cervical / Trunk Assessment: Normal  Communication   Communication: No difficulties  Cognition Arousal/Alertness: Lethargic Behavior During Therapy: WFL for tasks assessed/performed Overall Cognitive Status: Impaired/Different from baseline Area of Impairment: Orientation;Following commands;Problem solving                 Orientation Level: Disoriented to;Time     Following Commands: Follows one step commands consistently;Follows multi-step commands with increased time     Problem Solving: Slow processing          General Comments General comments (skin integrity, edema, etc.): VSS on RA    Exercises     Assessment/Plan    PT Assessment Patient needs continued PT services  PT Problem List Decreased strength;Decreased activity tolerance;Decreased balance;Decreased mobility;Decreased safety awareness       PT Treatment Interventions DME instruction;Gait training;Stair training;Functional mobility training;Therapeutic exercise;Therapeutic activities;Balance training;Neuromuscular re-education;Patient/family education    PT Goals (Current goals can be found in the Care Plan section)  Acute Rehab PT Goals Patient Stated Goal: to get better PT Goal Formulation: With patient Time For Goal Achievement: 08/16/21 Potential to Achieve Goals: Good    Frequency Min 4X/week      Co-evaluation               AM-PAC PT "6 Clicks" Mobility  Outcome Measure Help needed turning from your back to your side while in a flat bed without using bedrails?: A Little Help needed moving from lying on your back to sitting on the side of a flat bed without using bedrails?: A Little Help needed moving to and from a bed to a chair (including a wheelchair)?: A Little Help needed standing up from a chair using your arms (e.g., wheelchair or bedside chair)?: A Little Help needed to walk in hospital room?: A Little Help needed climbing 3-5 steps with a railing? : A Little 6 Click Score: 18    End of Session   Activity Tolerance: Patient tolerated treatment well Patient left: in chair;with call bell/phone within reach;with chair alarm set Nurse Communication: Mobility status PT Visit Diagnosis: Unsteadiness on feet (R26.81);Muscle weakness (generalized) (M62.81)    Time: 6384-6659 PT Time Calculation (min) (ACUTE ONLY): 20 min   Charges:   PT Evaluation $PT Eval Moderate Complexity: 1 Mod          Alyssa Mayo A. Dan Humphreys PT, DPT Acute Rehabilitation Services Pager [phone] Office [phone]   Alyssa Mayo 08/02/2021, 2:44 PM

## 2021-08-02 NOTE — Progress Notes (Signed)
°  Transition of Care [facility]) Screening Note   Patient Details  Name: Alyssa Mayo Date of Birth: 1945-10-09   Transition of Care [facility]) CM/SW Contact:    Mearl Latin, LCSW Phone Number: 08/02/2021, 12:01 PM    Transition of Care Department [facility]) has reviewed patient and no TOC needs have been identified at this time. We will continue to monitor patient advancement through interdisciplinary progression rounds. If new patient transition needs arise, please place a TOC consult.

## 2021-08-02 NOTE — Evaluation (Signed)
Speech Language Pathology Evaluation Patient Details Name: Alyssa Mayo MRN: [id number] DOB: 20-Sep-1945 Today's Date: 08/02/2021 Time:  -     Problem List:  Patient Active Problem List   Diagnosis Date Noted   Acute ischemic stroke ([place]) 08/01/2021   GERD (gastroesophageal reflux disease) 07/20/2019   Advanced care planning/counseling discussion 12/17/2016   Skin lesions, generalized 11/13/2016   Chronic right hip pain 06/17/2016   Vitamin D deficiency 09/26/2015   Diastasis recti 08/08/2015   Elevated serum GGT level 07/24/2015   Essential hypertension 07/17/2015   Fatty liver 07/17/2015   Elevated alkaline phosphatase level 07/17/2015   Elevated serum glutamic pyruvic transaminase (SGPT) level 07/17/2015   Abnormal finding on EKG 07/11/2015   Morbid obesity ([place]) 07/11/2015   Colon, diverticulosis 07/11/2015   Abdominal wall hernia 07/11/2015   DM (diabetes mellitus), type 2 with neurological complications ([place])    Hyperlipidemia    Past Medical History:  Past Medical History:  Diagnosis Date   Abnormal levels of other serum enzymes 07/17/2015   Arthritis    Constipation 07/11/2015   COVID-19 03/06/2021   Diabetes mellitus without complication ([place])    Elevated liver enzymes    Fatty liver    Generalized abdominal pain 07/11/2015   Hyperlipidemia    Hypertension    Lifelong obesity    Macular degeneration    Morbid obesity due to excess calories ([place]) 07/11/2015   Other fatigue 07/11/2015   Sleep apnea    Spinal headache    Past Surgical History:  Past Surgical History:  Procedure Laterality Date   ABDOMINAL HYSTERECTOMY     APPENDECTOMY     CATARACT EXTRACTION     CERVICAL LAMINECTOMY  07/21/1985   CESAREAN SECTION     COLONOSCOPY  07/21/2012   diverticulosis, [facility] Dr. Donnella Sham    COLONOSCOPY WITH PROPOFOL N/A 02/04/2019   Procedure: COLONOSCOPY WITH PROPOFOL;  Surgeon: Lollie Sails, MD;  Location: [facility] ENDOSCOPY;  Service: Endoscopy;   Laterality: N/A;   COLONOSCOPY WITH PROPOFOL N/A 03/18/2021   Procedure: COLONOSCOPY WITH PROPOFOL;  Surgeon: Lesly Rubenstein, MD;  Location: [facility] ENDOSCOPY;  Service: Endoscopy;  Laterality: N/A;  DM   DIAGNOSTIC LAPAROSCOPY     ESOPHAGOGASTRODUODENOSCOPY (EGD) WITH PROPOFOL N/A 02/04/2019   Procedure: ESOPHAGOGASTRODUODENOSCOPY (EGD) WITH PROPOFOL;  Surgeon: Lollie Sails, MD;  Location: [facility] ENDOSCOPY;  Service: Endoscopy;  Laterality: N/A;   ESOPHAGOGASTRODUODENOSCOPY (EGD) WITH PROPOFOL N/A 03/18/2021   Procedure: ESOPHAGOGASTRODUODENOSCOPY (EGD) WITH PROPOFOL;  Surgeon: Lesly Rubenstein, MD;  Location: [facility] ENDOSCOPY;  Service: Endoscopy;  Laterality: N/A;   SPINE SURGERY     TUBAL LIGATION     HPI:  Alyssa Mayo is a 76 y.o. female referred for a speech-language eval d/t suspected dysarthria. Patient presented to the ER for left-sided weakness. MRI findings include, "early subacute infarcts in the  right MCA distribution with involvement of the insula, lentiform and  caudate nuclei, frontal, and parietal lobes with evidence of  hemorrhagic transformation." Past medical history of diabetes, hypertension, hyperlipidemia, obesity, sleep apnea.  Medical history noted that she appears to have been suffering with some upper respiratory symptoms. She was found to be mildly hyperglycemic with blood sugars in the upper 200s.   Assessment / Plan / Recommendation Clinical Impression  Administered SLUMS and patient scored 20/30 which could indicate a mild to moderate cognitive-communication impairment. Exhibited strengths in memory, attention, auditory comprehension, and verbal expression. Overall cognitive status is impaired from baseline. Awareness was impaired with verbal and functional  complex tasks (i.e., word problems & clock drawing). Patient's self-monitoring displayed an impairment for functional complex tasks. In regards to her motor speech, low vocal intensity, hypophonia noted.  The patient is impaired at the word level, but is intelligible in conversation. Patient demonstrated a mild oral motor impairment.  SLP recommends the patient receive speech-language services at in-patient rehab; Speech services to target cognitive communication deficits and mild to moderate dysarthria.    SLP Assessment  SLP Recommendation/Assessment: Patient needs continued Speech Lanaguage Pathology Services SLP Visit Diagnosis: Cognitive communication deficit (R41.841);Dysarthria and anarthria (R47.1)    Recommendations for follow up therapy are one component of a multi-disciplinary discharge planning process, led by the attending physician.  Recommendations may be updated based on patient status, additional functional criteria and insurance authorization.    Follow Up Recommendations       Assistance Recommended at Discharge     Functional Status Assessment    Frequency and Duration           SLP Evaluation Cognition  Overall Cognitive Status: Impaired/Different from baseline Arousal/Alertness: Lethargic Orientation Level: Oriented to person;Oriented to place Year: 2023 Month: January Day of Week: Correct Attention: Sustained Sustained Attention: Appears intact Selective Attention: Appears intact Memory: Appears intact Awareness: Impaired Awareness Impairment: Emergent impairment Problem Solving: Impaired Problem Solving Impairment: Verbal complex;Functional complex Executive Function: Self Monitoring Self Monitoring: Impaired Self Monitoring Impairment: Functional complex       Comprehension  Auditory Comprehension Overall Auditory Comprehension: Appears within functional limits for tasks assessed    Expression Verbal Expression Overall Verbal Expression: Appears within functional limits for tasks assessed Written Expression Dominant Hand: Right Written Expression: Not tested   Oral / Motor  Oral Motor/Sensory Function Overall Oral Motor/Sensory Function: Mild  impairment Facial ROM: Reduced left Facial Symmetry: Abnormal symmetry left Facial Strength: Reduced left Lingual ROM: Reduced left Lingual Symmetry: Within Functional Limits Lingual Strength: Reduced Velum: Within Functional Limits Mandible: Within Functional Limits Motor Speech Overall Motor Speech: Impaired Respiration: Within functional limits Phonation: Low vocal intensity Resonance: Within functional limits Articulation: Impaired Level of Impairment: Word Intelligibility: Intelligible Motor Planning: Witnin functional limits            [employer] 08/02/2021, 2:11 PM

## 2021-08-02 NOTE — Evaluation (Signed)
Occupational Therapy Evaluation Patient Details Name: Alyssa Mayo MRN: [id number] DOB: 25-Jun-1946 Today's Date: 08/02/2021   History of Present Illness 76 y/o female presented to ED on 08/01/21 after being found down with L sided weakness and slurred speech. 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 CTA showed nonocclusive filling defect in R MCA bifurcation and irregular and diminished flow in inferior division of R M2. PMH: HTN, obesity, macular degeneration, diabetes, sleep apnea   Clinical Impression   Patient admitted or the diagnosis above.  PTA she lives with her son, who works from home, and can provide needed assist.  She used a SPC outside, and did not need any assist for ADL/IADL at home.  Deficits impacting independence are listed below.  Currently she is needing up to [place] for basic mobility at [facility] level, and up to Mod A for lower body ADL.  Initially ROM and MMT to the L UE seemed functional, but during functional tasks she demonstrated decreased coordination and slowed movements - dropping items.  OT to follow in the acute setting, and [place] OT is recommended for post acute rehab.        Recommendations for follow up therapy are one component of a multi-disciplinary discharge planning process, led by the attending physician.  Recommendations may be updated based on patient status, additional functional criteria and insurance authorization.   Follow Up Recommendations  Home health OT    Assistance Recommended at Discharge Intermittent Supervision/Assistance  Patient can return home with the following      Functional Status Assessment  Patient has had a recent decline in their functional status and demonstrates the ability to make significant improvements in function in a reasonable and predictable amount of time.  Equipment Recommendations  Tub/shower seat    Recommendations for Other Services        Precautions / Restrictions Precautions Precautions: Fall Precaution Comments: SBP<160 Restrictions Weight Bearing Restrictions: No      Mobility Bed Mobility Overal bed mobility: Needs Assistance Bed Mobility: Supine to Sit     Supine to sit: Min assist          Transfers Overall transfer level: Needs assistance   Transfers: Sit to/from Stand;Bed to chair/wheelchair/BSC Sit to Stand: Min assist     Step pivot transfers: Min assist            Balance Overall balance assessment: Needs assistance Sitting-balance support: Feet supported Sitting balance-Leahy Scale: Fair     Standing balance support: Single extremity supported;During functional activity Standing balance-Leahy Scale: Fair                             ADL either performed or assessed with clinical judgement   ADL       Grooming: Wash/dry hands;Wash/dry face;Minimal assistance;Sitting   Upper Body Bathing: Minimal assistance;Sitting   Lower Body Bathing: Moderate assistance;Sit to/from stand   Upper Body Dressing : Minimal assistance;Sitting   Lower Body Dressing: Moderate assistance;Sit to/from stand   Toilet Transfer: Minimal Teacher, English as a foreign language;Ambulation Toilet Transfer Details (indicate cue type and reason): HHA           General ADL Comments: slowed coordination to L UE - ? ataxic versus weakness     Vision Baseline Vision/History: 1 Wears glasses Patient Visual Report: No change from baseline       Perception Perception Perception: Within Functional Limits   Praxis Praxis Praxis:  Intact    Pertinent Vitals/Pain Pain Assessment: Faces Faces Pain Scale: No hurt Pain Intervention(s): Monitored during session     Hand Dominance Right   Extremity/Trunk Assessment Upper Extremity Assessment Upper Extremity Assessment: LUE deficits/detail LUE Coordination: decreased fine motor;decreased gross motor   Lower Extremity Assessment Lower Extremity  Assessment: Defer to PT evaluation   Cervical / Trunk Assessment Cervical / Trunk Assessment: Normal   Communication Communication Communication: No difficulties   Cognition Arousal/Alertness: Lethargic Behavior During Therapy: WFL for tasks assessed/performed Overall Cognitive Status: Impaired/Different from baseline Area of Impairment: Orientation;Following commands;Problem solving                 Orientation Level: Disoriented to;Time     Following Commands: Follows one step commands consistently;Follows multi-step commands with increased time     Problem Solving: Slow processing       General Comments       Exercises     Shoulder Instructions      Home Living Family/patient expects to be discharged to:: Private residence Living Arrangements: Children Available Help at Discharge: Family;Available 24 hours/day Type of Home: House Home Access: Stairs to enter [employer] of Steps: 4 Entrance Stairs-Rails: Can reach both;Left;Right Home Layout: One level     Bathroom Shower/Tub: Teacher, early years/pre: Standard Bathroom Accessibility: No How Accessible: Accessible via walker Home Equipment: BSC/3in1;Cane - single point      Lives With: Son    Prior Functioning/Environment Prior Level of Function : Driving;Independent/Modified Independent               ADLs Comments: No assist with ADL/IADL        OT Problem List: Decreased strength;Decreased coordination;Impaired balance (sitting and/or standing);Decreased knowledge of use of DME or AE;Impaired UE functional use      OT Treatment/Interventions: Self-care/ADL training;Therapeutic exercise;Balance training;Therapeutic activities;DME and/or AE instruction    OT Goals(Current goals can be found in the care plan section) Acute Rehab OT Goals Patient Stated Goal: Keep moving OT Goal Formulation: With patient Time For Goal Achievement: 08/16/21 Potential to Achieve Goals:  Good ADL Goals Pt Will Perform Grooming: with supervision;standing Pt Will Perform Lower Body Bathing: with supervision;sit to/from stand Pt Will Perform Lower Body Dressing: with supervision;sit to/from stand Pt Will Transfer to Toilet: with supervision;ambulating;regular height toilet Pt Will Perform Toileting - Clothing Manipulation and hygiene: with supervision;sit to/from stand  OT Frequency: Min 2X/week    Co-evaluation              AM-PAC OT "6 Clicks" Daily Activity     Outcome Measure Help from another person eating meals?: A Little Help from another person taking care of personal grooming?: A Little Help from another person toileting, which includes using toliet, bedpan, or urinal?: A Little Help from another person bathing (including washing, rinsing, drying)?: A Lot Help from another person to put on and taking off regular upper body clothing?: A Little Help from another person to put on and taking off regular lower body clothing?: A Lot 6 Click Score: 16   End of Session Equipment Utilized During Treatment: Gait belt Nurse Communication: Mobility status  Activity Tolerance: Patient tolerated treatment well Patient left: in chair;with call bell/phone within reach;with chair alarm set  OT Visit Diagnosis: Unsteadiness on feet (R26.81);Muscle weakness (generalized) (M62.81);Ataxia, unspecified (R27.0);Hemiplegia and hemiparesis Hemiplegia - Right/Left: Left Hemiplegia - dominant/non-dominant: Non-Dominant                Time: [id number] OT Time  Calculation (min): 23 min Charges:  OT General Charges $OT Visit: 1 Visit OT Evaluation $OT Eval Moderate Complexity: 1 Mod  08/02/2021  RP, OTR/L  Acute Rehabilitation Services  Office:  [phone]   Metta Clines 08/02/2021, 2:27 PM

## 2021-08-02 NOTE — Progress Notes (Signed)
Sodium jumped from 135-145 and less than 10 hours. Clinically patient is stable. I will stop the 3% for now and continue her on normal saline 75 cc an hour only. We will reinitiate hyperosmolar therapy if clinical or imaging worsening is noted.  -- Amie Portland, MD Neurologist [facility] Pager: [phone]

## 2021-08-02 NOTE — Progress Notes (Signed)
Lower extremity venous has been completed.   Preliminary results in CV Proc.   Kodee Drury Shenna Brissette 08/02/2021 2:18 PM

## 2021-08-02 NOTE — Progress Notes (Addendum)
STROKE TEAM PROGRESS NOTE   INTERVAL HISTORY Her sons are at the bedside. Echo is being completed. Cleviprex infusing for BP control, BP goal relaxed to 160. Norvasc started. No headache, nausea, or vomiting. Add lisinopril if renal function is adequate and more PO medications are needed to meet BP goal.   Vitals:   08/02/21 0900 08/02/21 1000 08/02/21 1100 08/02/21 1159  BP: (!) 131/51 (!) 133/55 (!) 147/58   Pulse: 95 92 94   Resp: (!) 23 (!) 24 (!) 21   Temp:    (!) 97.4 F (36.3 C)  TempSrc:    Axillary  SpO2: 91% 97% 93%    CBC:  Recent Labs  Lab 08/01/21 1019 08/02/21 1151  WBC 14.0* 13.9*  NEUTROABS 11.0*  --   HGB 14.1 13.8  HCT 44.2 44.8  MCV 89.1 90.9  PLT 308 [id number]   Basic Metabolic Panel:  Recent Labs  Lab 08/01/21 1019 08/01/21 1458 08/01/21 2315 08/02/21 1151  NA 137   < > 145 141  K 4.2  --   --  4.3  CL 101  --   --  111  CO2 24  --   --  17*  GLUCOSE 113*  --   --  98  BUN 31*  --   --  26*  CREATININE 1.17*  --   --  1.05*  CALCIUM 9.5  --   --  8.9   < > = values in this interval not displayed.   Lipid Panel:  Recent Labs  Lab 08/02/21 0341  CHOL 182  TRIG 289*  HDL 31*  CHOLHDL 5.9  VLDL 58*  LDLCALC 93   HgbA1c:  Recent Labs  Lab 08/02/21 0341  HGBA1C 7.4*   Urine Drug Screen:  Recent Labs  Lab 08/01/21 1821  LABOPIA NONE DETECTED  COCAINSCRNUR NONE DETECTED  LABBENZ NONE DETECTED  AMPHETMU NONE DETECTED  THCU NONE DETECTED  LABBARB NONE DETECTED    Alcohol Level  Recent Labs  Lab 08/01/21 1019  ETH <10    IMAGING past 24 hours CT ANGIO HEAD NECK W WO CM  Result Date: 08/01/2021 CLINICAL DATA:  Follow-up large right MCA stroke with hemorrhagic conversion EXAM: CT ANGIOGRAPHY HEAD AND NECK TECHNIQUE: Multidetector CT imaging of the head and neck was performed using the standard protocol during bolus administration of intravenous contrast. Multiplanar CT image reconstructions and MIPs were obtained to evaluate the  vascular anatomy. Carotid stenosis measurements (when applicable) are obtained utilizing NASCET criteria, using the distal internal carotid diameter as the denominator. RADIATION DOSE REDUCTION: This exam was performed according to the departmental dose-optimization program which includes automated exposure control, adjustment of the mA and/or kV according to patient size and/or use of iterative reconstruction technique. CONTRAST:  56mL OMNIPAQUE IOHEXOL 350 MG/ML SOLN COMPARISON:  08/01/2021 CT head and MRI head FINDINGS: CT HEAD FINDINGS For noncontrast findings, please see same day CT head. CTA NECK FINDINGS Aortic arch: Two-vessel arch with a common origin of the brachiocephalic and left common carotid arteries. Imaged portion shows no evidence of aneurysm or dissection. No significant stenosis of the major arch vessel origins. Aortic atherosclerosis. Right carotid system: No evidence of dissection, stenosis (50% or greater) or occlusion. Retropharyngeal course of the common and internal carotid arteries. Left carotid system: No evidence of dissection, stenosis (50% or greater) or occlusion. Retropharyngeal course of the common and internal carotid arteries. Vertebral arteries: No evidence of dissection, stenosis (50% or greater) or occlusion. Skeleton: Degenerative changes  with straightening and mild reversal of the normal cervical lordosis, with degenerative osseous fusion of C4-C6. No acute osseous abnormality. Other neck: Negative. Upper chest: No focal pulmonary opacity or pleural effusion. Review of the MIP images confirms the above findings CTA HEAD FINDINGS Evaluation is somewhat limited by degree of venous contamination. Anterior circulation: Both internal carotid arteries are patent to the termini, without stenosis or other abnormality. A1 segments patent. Normal anterior communicating artery. Anterior cerebral arteries are patent to their distal aspects. No M1 stenosis or occlusion. At the right MCA  bifurcation, there is a nonocclusive filling defect (series 9, image [id number]), with irregular and diminished flow in the inferior division and its branches. Patent superior division. Normal left MCA bifurcation. Distal left MCA branches perfused. Posterior circulation: Vertebral arteries patent to the vertebrobasilar junction without stenosis. Basilar patent to its distal aspect. Small fenestration proximally. Superior cerebellar arteries patent bilaterally. Bilateral P1 segments patent. Bilateral posterior communicating arteries patent. PCAs perfused to their distal aspects without stenosis. Venous sinuses: As permitted by contrast timing, patent. Anatomic variants: None significant Review of the MIP images confirms the above findings IMPRESSION: 1. Nonocclusive filling defect in the right MCA bifurcation, with irregular and diminished flow in the inferior M2. 2. No other significant stenosis or large vessel occlusion. 3.  No hemodynamically significant stenosis in the neck. Electronically Signed   By: Merilyn Baba M.D.   On: 08/01/2021 19:40   CT HEAD WO CONTRAST  Result Date: 08/02/2021 CLINICAL DATA:  Stroke follow-up EXAM: CT HEAD WITHOUT CONTRAST TECHNIQUE: Contiguous axial images were obtained from the base of the skull through the vertex without intravenous contrast. RADIATION DOSE REDUCTION: This exam was performed according to the departmental dose-optimization program which includes automated exposure control, adjustment of the mA and/or kV according to patient size and/or use of iterative reconstruction technique. COMPARISON:  Brain MRI from yesterday FINDINGS: Brain: Cytotoxic edema from recent infarct in the right MCA territory, unchanged in extent when compared to prior brain MRI, specifically involving the lateral frontal lobe, insula, and patchy along the high frontal parietal convexity. Basal ganglia involvement at the putamen and caudate. Petechial hemorrhage centered on the frontal operculum  and insula. Local mass effect is unchanged. No progressive finding or hydrocephalus. Vascular: No hyperdense vessel or unexpected calcification. Skull: Normal. Negative for fracture or focal lesion. Sinuses/Orbits: No acute finding. IMPRESSION: Unchanged right MCA territory infarct with petechial hemorrhage. Electronically Signed   By: Jorje Guild M.D.   On: 08/02/2021 04:22   DG Chest Port 1 View  Result Date: 08/02/2021 CLINICAL DATA:  Leukocytosis EXAM: PORTABLE CHEST 1 VIEW COMPARISON:  08/01/2021 FINDINGS: Stable cardiomegaly. Slightly low lung volumes. Streaky bibasilar opacities. No large pleural fluid collection. No pneumothorax. IMPRESSION: Low lung volumes with streaky bibasilar opacities, likely atelectasis. Electronically Signed   By: Davina Poke D.O.   On: 08/02/2021 08:49   DG Chest Port 1 View  Result Date: 08/01/2021 CLINICAL DATA:  Best obtainable images. Unable to locate bra, patient would not stop rolling onto left side. Stroke, cerebrum, left sided weakness and slurred speech. Per son, symptoms started Tuesday. EXAM: PORTABLE CHEST 1 VIEW COMPARISON:  07/23/2021 FINDINGS: Stable large cardiac silhouette. Low lung volumes. LEFT base poorly evaluated. No pulmonary edema. No pneumothorax. IMPRESSION: Cardiomegaly and LEFT basilar atelectasis. Electronically Signed   By: Suzy Bouchard M.D.   On: 08/01/2021 17:02   VAS [place] LOWER EXTREMITY VENOUS (DVT)  Result Date: 08/02/2021    ECHOCARDIOGRAM REPORT   Patient Name:  Alyssa Mayo Date of Exam: 08/02/2021 Medical Rec #:  [id number]          Height:       63.0 in Accession #:    [id number]         Weight:       206.0 lb Date of Birth:  09-24-1945           BSA:          1.958 m Patient Age:    76 years           BP:           131/51 mmHg Patient Gender: F                  HR:           96 bpm. Exam Location:  Inpatient Procedure: 2D Echo Indications:    Stroke  History:        Patient has no prior history of Echocardiogram  examinations.                 Risk Factors:Hypertension and Diabetes.  Sonographer:    Jefferey Pica Referring Phys: [id number] Mariann Palo  Sonographer Comments: Technically difficult study due to poor echo windows. Image acquisition challenging due to patient body habitus and Image acquisition challenging due to uncooperative patient. Contrast was not used due to patients poor temperment and her request to end the exam. IMPRESSIONS  1. Technically difficult study with poor echo windows. Patient declined Definity contrast.  2. Left ventricular ejection fraction, by estimation, is 55 to 60%. The left ventricle has normal function. Left ventricular endocardial border not optimally defined to evaluate regional wall motion. Left ventricular diastolic parameters are consistent with Grade I diastolic dysfunction (impaired relaxation).  3. Right ventricular systolic function is normal. The right ventricular size is normal. The RV wall appears thickened, however, this may be due to a prominent fat pad.  4. The mitral valve is normal in structure. Trivial mitral valve regurgitation.  5. The aortic valve is tricuspid. There is mild calcification of the aortic valve. There is mild thickening of the aortic valve. Aortic valve regurgitation is not visualized. Aortic valve sclerosis/calcification is present, without any evidence of aortic stenosis.  6. The inferior vena cava is normal in size with greater than 50% respiratory variability, suggesting right atrial pressure of 3 mmHg. Conclusion(s)/Recommendation(s): No intracardiac source of embolism detected on this transthoracic study. Consider a transesophageal echocardiogram to exclude cardiac source of embolism if clinically indicated. FINDINGS  Left Ventricle: Left ventricular ejection fraction, by estimation, is 55 to 60%. The left ventricle has normal function. Left ventricular endocardial border not optimally defined to evaluate regional wall motion. The left ventricular  internal cavity size was normal in size. There is no left ventricular hypertrophy. Left ventricular diastolic parameters are consistent with Grade I diastolic dysfunction (impaired relaxation). Right Ventricle: The right ventricular size is normal. Right vetricular wall thickness was not well visualized. Right ventricular systolic function is normal. Left Atrium: Left atrial size was not well visualized. Right Atrium: Right atrial size was not well visualized. Pericardium: Trivial pericardial effusion is present. Presence of epicardial fat layer. Mitral Valve: The mitral valve is normal in structure. Trivial mitral valve regurgitation. Tricuspid Valve: The tricuspid valve is normal in structure. Tricuspid valve regurgitation is trivial. Aortic Valve: The aortic valve is tricuspid. There is mild calcification of the aortic valve. There is mild thickening of the aortic valve. Aortic valve regurgitation  is not visualized. Aortic valve sclerosis/calcification is present, without any evidence of aortic stenosis. Aortic valve peak gradient measures 23.4 mmHg. Pulmonic Valve: The pulmonic valve was normal in structure. Pulmonic valve regurgitation is trivial. Aorta: The aortic root and ascending aorta are structurally normal, with no evidence of dilitation. Venous: The inferior vena cava is normal in size with greater than 50% respiratory variability, suggesting right atrial pressure of 3 mmHg. IAS/Shunts: The atrial septum is grossly normal.  LEFT VENTRICLE PLAX 2D LVIDd:         5.10 cm   Diastology LVIDs:         4.10 cm   LV e' medial:    6.16 cm/s LV PW:         1.10 cm   LV E/e' medial:  16.9 LV IVS:        1.00 cm   LV e' lateral:   7.87 cm/s LVOT diam:     2.00 cm   LV E/e' lateral: 13.2 LV SV:         100 LV SV Index:   51 LVOT Area:     3.14 cm  IVC IVC diam: 1.70 cm LEFT ATRIUM           Index        RIGHT ATRIUM           Index LA Vol (A2C): 63.7 ml 32.53 ml/m  RA Area:     11.70 cm LA Vol (A4C): 38.6 ml  19.71 ml/m  RA Volume:   19.90 ml  10.16 ml/m  AORTIC VALVE                  PULMONIC VALVE AV Area (Vmax): 2.03 cm      PV Vmax:       0.89 m/s AV Vmax:        242.00 cm/s   PV Peak grad:  3.1 mmHg AV Peak Grad:   23.4 mmHg LVOT Vmax:      156.00 cm/s LVOT Vmean:     107.000 cm/s LVOT VTI:       0.319 m  AORTA Ao Asc diam: 3.10 cm MITRAL VALVE MV Area (PHT): 3.79 cm     SHUNTS MV Decel Time: 200 msec     Systemic VTI:  0.32 m MV E velocity: 104.00 cm/s  Systemic Diam: 2.00 cm MV A velocity: 111.00 cm/s MV E/A ratio:  0.94 Gwyndolyn Kaufman MD Electronically signed by Gwyndolyn Kaufman MD Signature Date/Time: 08/02/2021/1:38:41 PM    Final     PHYSICAL EXAM  Physical Exam  Constitutional: Appears well-developed and well-nourished.  Psych: Affect appropriate to situation Eyes: No scleral injection HENT: No OP obstrucion MSK: no joint deformities.  Cardiovascular: Normal rate and regular rhythm.  Respiratory: Effort normal, non-labored breathing GI: Soft.  No distension. There is no tenderness.  Skin: WDI  Neuro: Mental Status: Patient is drowsy but oriented to person, place, month, year, and situation. Patient is able to give a clear and coherent history. Slight neglect with RUE.  Cranial Nerves: II: Visual Fields are full. Pupils are equal, round, and reactive to light.   III,IV, VI: EOMI without ptosis or diploplia.  V: Facial sensation is symmetric to temperature VII: right facial droop VIII: Hearing is intact to voice X: Palate elevates symmetrically XI: Shoulder shrug is symmetric. XII: Tongue protrudes midline without atrophy or fasciculations.  Motor: Tone is normal. Bulk is normal. 5/5 strength was present in RLE, LLE, RLE. RUE  drift present, fine motor control impaired, hand grasp weak Sensory: Sensation is symmetric to light touch and temperature in the arms and legs. No extinction to DSS present.  Coordination: FNF and HKS are intact bilaterally  ASSESSMENT/PLAN Ms.  Alyssa Mayo is a 76 y.o. female with history of diabetes, hypertension, hyperlipidemia, obesity, and sleep apnea presenting to [facility] after being found down by her son. Her last know well was approximately 07/29/2021. She may have been suffering with some upper respiratory symptoms, took some cough medicine and has not been like herself since Tuesday.  She also had not taken her diabetes medications.  She was found to be mildly hyperglycemic with blood sugars in the upper 200s.   Stroke:  Rt MCA infarct with hemorrhagic transformation, embolic pattern likely secondary unclear source, concerning for occult A. fib Code Stroke- infarct versus mass in the right MCA territory. Repeat Head CT- Unchanged right MCA territory infarct with petechial hemorrhage. CTA head & neck- R M2 thrombus MRI- extensive restricted diffusion throughout much of the right MCA territory with associated changes of hemorrhagic transformation and localized cerebral edema and mass-effect.. 2D Echo pending LDL 93 HgbA1c 7.4 VTE prophylaxis - SCDs aspirin 81 mg daily prior to admission, now on No antithrombotic. Given hemorrhagic transformation. Therapy recommendations:  Pending Disposition:  Pending  Cerebral Edema with Mass Effect Hypertonic saline d/c'd overnight Na went from 135 to 145 in 10 hours Switch to NS 42ml/hr Infarct moderate size with only focal mass effect and rather mild MLS, not felt 3% saline needed  CT repeat pending in am  Hypertension Home meds:  None Start Amlodipine 10mg  and lisinopril 40 Stable Taper off Cleviprex as able BP goal <160 Long-term BP goal normotensive  Hyperlipidemia Home meds:  Simvastatin, resumed in hospital LDL 93, goal < 70 High intensity statin not indicated  On Lipitor 20 per pharmacy Continue statin at discharge  Diabetes type II Controlled Home meds:  Farxiga, Glipizide, metformin HgbA1c 7.4, goal < 7.0 CBGs SSI  Other Stroke Risk Factors Advanced Age >/=  67  Obesity, 36.49, BMI >/= 30 associated with increased stroke risk, recommend weight loss, diet and exercise as appropriate  Family hx stroke (mother, grandmother) Obstructive sleep apnea, on CPAP at home  Other Active Problems Pneumonia Treated for PNA outpatient  CXR- atelectasis Encourage good pulmonary hygiene with incentive spirometer and cough and deep breathing   Hospital day # 1  Patient seen and examined by NP/APP with MD. MD to update note as needed.   Janine Ores, DNP, FNP-BC [facility] Pager: [phone]  ATTENDING NOTE: I reviewed above note and agree with the assessment and plan. Pt was seen and examined.   76 year old female with history of diabetes, hypertension, hyperlipidemia, obesity and OSA admitted for found down at home with altered mental status and hypoglycemia.  CT showed right MCA infarct with fluent petechial hemorrhage.  MRI confirmed above finding.  Repeat CT shows stable infarct and fluent petechial hemorrhage.  CTA head and neck showed right M2 thrombus.  EF 55 to 60%.  LE venous Doppler negative for PFO.  LDL 93, A1c 7.4.  Creatinine 1.17, WBC 14.0  On exam, patient lying in bed, 2 sons are at bedside.  Patient mildly lethargic, however awake alert orientated x3.  Mild to moderate dysarthria.  Visual fields full, no gaze palsy, left facial droop.  Left upper extremity slight drift with decreased left hand grip and dexterity.  Bilateral lower extremity equal strengths.  Sensation and coordination  intact.  Gait not tested  Etiology for patient stroke not quite clear, however concerning for occult A. fib.  Recommend further long-term cardiac monitoring to evaluate A. fib.  Hold off antiplatelet for now given hemorrhagic transformation.  Repeat CT in the a.m.  Given only moderate sized stroke, only focal mass-effect with mild midline shift, do not feel 3% saline need to be continued.  Currently on normal saline.  Agree to monitor sodium level.   BP goal less than 160, put on amlodipine and lisinopril to taper off Cleviprex.  On Lipitor 20.  PT/OT recommend home health PT/OT.  For detailed assessment and plan, please refer to above as I have made changes wherever appropriate.   I had long discussion with two sons at bedside, updated pt current condition, treatment plan and potential prognosis, and answered all the questions. They expressed understanding and appreciation.    Rosalin Hawking, MD PhD Stroke Neurology 08/02/2021 7:01 PM  This patient is critically ill due to right MCA stroke, hemorrhagic transformation, hypertensive emergency, cerebral edema and at significant risk of neurological worsening, death form brain herniation, hemorrhagic transformation, respiratory failure. This patient's care requires constant monitoring of vital signs, hemodynamics, respiratory and cardiac monitoring, review of multiple databases, neurological assessment, discussion with family, other specialists and medical decision making of high complexity. I spent 35 minutes of neurocritical care time in the care of this patient.    To contact Stroke Continuity provider, please refer to [email or web address] After hours, contact General Neurology

## 2021-08-03 ENCOUNTER — Inpatient Hospital Stay (HOSPITAL_COMMUNITY): Payer: Medicare HMO

## 2021-08-03 LAB — GLUCOSE, CAPILLARY
Glucose-Capillary: 111 mg/dL — ABNORMAL HIGH (ref 70–99)
Glucose-Capillary: 120 mg/dL — ABNORMAL HIGH (ref 70–99)

## 2021-08-03 LAB — TRIGLYCERIDES: Triglycerides: 243 mg/dL — ABNORMAL HIGH (ref <150)

## 2021-08-03 NOTE — Progress Notes (Signed)
Physical Therapy Treatment Patient Details Name: Alyssa Mayo MRN: [id number] DOB: [birth year] Today's Date: 08/03/2021   History of Present Illness 76 y/o female presented to ED on 08/01/21 after being found down with L sided weakness and slurred speech. 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 CTA showed nonocclusive filling defect in R MCA bifurcation and irregular and diminished flow in inferior division of R M2. PMH: HTN, obesity, macular degeneration, diabetes, sleep apnea    PT Comments    Patient progressed ambulation distance this date, however patient seems slow to process information and poor awareness into deficits. Patient with L inattention and potential L visual field cut noted with ambulation and running into objects on the L side with no correction. Updated recommendation to CIR at discharge to maximize functional independence and safety.     Recommendations for follow up therapy are one component of a multi-disciplinary discharge planning process, led by the attending physician.  Recommendations may be updated based on patient status, additional functional criteria and insurance authorization.  Follow Up Recommendations  Acute inpatient rehab (3hours/day)     Assistance Recommended at Discharge Frequent or constant Supervision/Assistance  Patient can return home with the following A little help with walking and/or transfers;A little help with bathing/dressing/bathroom;Help with stairs or ramp for entrance;Direct supervision/assist for financial management;Direct supervision/assist for medications management;Assist for transportation   Equipment Recommendations  None recommended by PT    Recommendations for Other Services       Precautions / Restrictions Precautions Precautions: Fall Precaution Comments: SBP<160 Restrictions Weight Bearing Restrictions: No     Mobility  Bed Mobility Overal bed  mobility: Needs Assistance Bed Mobility: Supine to Sit;Sit to Supine     Supine to sit: Min assist Sit to supine: Min assist   General bed mobility comments: minA for trunk elevation towards EOB and for returning LEs back into bed    Transfers Overall transfer level: Needs assistance Equipment used: 1 person hand held assist Transfers: Sit to/from Stand Sit to Stand: Min assist           General transfer comment: minA for boost up into standing    Ambulation/Gait Ambulation/Gait assistance: Min assist Gait Distance (Feet): 10 Feet (+60') Assistive device: 1 person hand held assist Gait Pattern/deviations: Step-through pattern;Decreased stride length Gait velocity: decreased     General Gait Details: reaching for objects on L and R. MinA for balance and patient requiring HHA for support   Stairs             Wheelchair Mobility    Modified Rankin (Stroke Patients Only) Modified Rankin (Stroke Patients Only) Pre-Morbid Rankin Score: Slight disability Modified Rankin: Moderately severe disability     Balance Overall balance assessment: Needs assistance Sitting-balance support: Feet supported Sitting balance-Leahy Scale: Fair     Standing balance support: Single extremity supported;During functional activity Standing balance-Leahy Scale: Fair                              Cognition Arousal/Alertness: Awake/alert Behavior During Therapy: Flat affect Overall Cognitive Status: Impaired/Different from baseline Area of Impairment: Attention;Following commands;Safety/judgement;Awareness;Problem solving                   Current Attention Level: Sustained   Following Commands: Follows one step commands with increased time Safety/Judgement: Decreased awareness of safety;Decreased awareness of deficits Awareness: Emergent Problem Solving: Slow processing;Difficulty sequencing;Requires verbal cues  General Comments: patient requiring cues to  attend to L side. Ambulated to toilet and patient stating that she was finished however had not complete pericare. Prompted patient to complete pericare but patient unaware of toilet paper on L side.        Exercises      General Comments        Pertinent Vitals/Pain Pain Assessment: No/denies pain    Home Living                          Prior Function            PT Goals (current goals can now be found in the care plan section) Acute Rehab PT Goals Patient Stated Goal: to get better PT Goal Formulation: With patient Time For Goal Achievement: 08/16/21 Potential to Achieve Goals: Good Progress towards PT goals: Progressing toward goals    Frequency    Min 4X/week      PT Plan Discharge plan needs to be updated    Co-evaluation              AM-PAC PT "6 Clicks" Mobility   Outcome Measure  Help needed turning from your back to your side while in a flat bed without using bedrails?: A Little Help needed moving from lying on your back to sitting on the side of a flat bed without using bedrails?: A Little Help needed moving to and from a bed to a chair (including a wheelchair)?: A Little Help needed standing up from a chair using your arms (e.g., wheelchair or bedside chair)?: A Little Help needed to walk in hospital room?: A Lot Help needed climbing 3-5 steps with a railing? : A Lot 6 Click Score: 16    End of Session   Activity Tolerance: Patient tolerated treatment well Patient left: in bed;with bed alarm set;with call bell/phone within reach Nurse Communication: Mobility status PT Visit Diagnosis: Unsteadiness on feet (R26.81);Muscle weakness (generalized) (M62.81)     Time: [id number] PT Time Calculation (min) (ACUTE ONLY): 23 min  Charges:  $Gait Training: 23-37 mins                     Sherill Mangen A. Gilford Rile PT, DPT Acute Rehabilitation Services Pager [phone] Office [phone]    Linna Hoff 08/03/2021, 3:16 PM

## 2021-08-03 NOTE — Progress Notes (Signed)
Inpatient Rehab Admissions Coordinator Note:   Per PT patient was screened for CIR candidacy by Daymond Cordts Luvenia Starch, CCC-SLP. At this time, pt appears to be a potential candidate for CIR. I will place an order for rehab consult for full assessment, per our protocol.  Please contact me any with questions.Wolfgang Phoenix, MS, CCC-SLP Admissions Coordinator [phone] 08/03/21 4:44 PM

## 2021-08-03 NOTE — Progress Notes (Signed)
STROKE TEAM PROGRESS NOTE   INTERVAL HISTORY Her family member is at the bedside.  She is sitting up in bed comfortably and is awake and interactive.  She is back on Cleviprex drip overnight and blood pressure adequately controlled.  CT scan of the head showed stable appearance of the hemorrhagic right MCA infarct without significant midline shift or intraventricular extension.  Telemetry monitoring shows sinus rhythm.  LDL cholesterol is 93 mg percent and hemoglobin A1c 7.4.  Echocardiogram shows normal ejection fraction without cardiac source of embolism.  Lower extremity venous Dopplers are negative for DVT.  Plans are to increase p.o. blood pressure medications and add as needed IV blood pressure medicines as well in order to wean off Cleviprex drip.  Neurological exam is unchanged.  Vital signs are stable.  Vitals:   08/03/21 0900 08/03/21 1000 08/03/21 1100 08/03/21 1200  BP: (!) 141/44 (!) 158/51 (!) 120/43 (!) 114/36  Pulse: 70 94 86 96  Resp: (!) 21 (!) 25 19 (!) 29  Temp:    99.2 F (37.3 C)  TempSrc:    Oral  SpO2: 93% 93% 91% 91%   CBC:  Recent Labs  Lab 08/01/21 1019 08/02/21 1151  WBC 14.0* 13.9*  NEUTROABS 11.0*  --   HGB 14.1 13.8  HCT 44.2 44.8  MCV 89.1 90.9  PLT 308 312   Basic Metabolic Panel:  Recent Labs  Lab 08/01/21 1019 08/01/21 1458 08/01/21 2315 08/02/21 1151  NA 137   < > 145 141  K 4.2  --   --  4.3  CL 101  --   --  111  CO2 24  --   --  17*  GLUCOSE 113*  --   --  98  BUN 31*  --   --  26*  CREATININE 1.17*  --   --  1.05*  CALCIUM 9.5  --   --  8.9   < > = values in this interval not displayed.   Lipid Panel:  Recent Labs  Lab 08/02/21 0341 08/03/21 0542  CHOL 182  --   TRIG 289* 243*  HDL 31*  --   CHOLHDL 5.9  --   VLDL 58*  --   LDLCALC 93  --    HgbA1c:  Recent Labs  Lab 08/02/21 0341  HGBA1C 7.4*   Urine Drug Screen:  Recent Labs  Lab 08/01/21 1821  LABOPIA NONE DETECTED  COCAINSCRNUR NONE DETECTED  LABBENZ NONE  DETECTED  AMPHETMU NONE DETECTED  THCU NONE DETECTED  LABBARB NONE DETECTED    Alcohol Level  Recent Labs  Lab 08/01/21 1019  ETH <10    IMAGING past 24 hours CT HEAD WO CONTRAST ( )  Result Date: 08/03/2021 CLINICAL DATA:  Stroke follow-up EXAM: CT HEAD WITHOUT CONTRAST TECHNIQUE: Contiguous axial images were obtained from the base of the skull through the vertex without intravenous contrast. RADIATION DOSE REDUCTION: This exam was performed according to the departmental dose-optimization program which includes automated exposure control, adjustment of the mA and/or kV according to patient size and/or use of iterative reconstruction technique. COMPARISON:  Yesterday FINDINGS: Brain: Right MCA territory infarction which is most confluent at the insula and operculum with petechial hemorrhage. No evidence of progression. No worrisome mass effect, collection, or hydrocephalus. Vascular: No hyperdense vessel or unexpected calcification. Skull: Normal. Negative for fracture or focal lesion. Sinuses/Orbits: Bilateral cataract resection IMPRESSION: No interval progression of the recent right MCA infarct with petechial hemorrhage. Electronically Signed   By: Christiane Ha  Watts M.D.   On: 08/03/2021 04:10   VAS [place] LOWER EXTREMITY VENOUS (DVT)  Result Date: 08/02/2021  Lower Venous DVT Study Patient Name:  Alyssa Mayo  Date of Exam:   08/02/2021 Medical Rec #: [id number]           Accession #:    [id number] Date of Birth:                     Patient Gender: Patient Age: Exam Location:  [facility] Procedure:      VAS [place] LOWER EXTREMITY VENOUS (DVT) Referring Phys: --------------------------------------------------------------------------------  Indications: Stroke.  Comparison Study: no prior Performing Technologist: Archie Patten RVS  Examination Guidelines: A complete evaluation includes B-mode imaging, spectral Doppler, color Doppler, and power Doppler as needed of all accessible portions  of each vessel. Bilateral testing is considered an integral part of a complete examination. Limited examinations for reoccurring indications may be performed as noted. The reflux portion of the exam is performed with the patient in reverse Trendelenburg.  +---------+---------------+---------+-----------+----------+--------------+  RIGHT     Compressibility Phasicity Spontaneity Properties Thrombus Aging  +---------+---------------+---------+-----------+----------+--------------+  CFV       Full            Yes       Yes                                    +---------+---------------+---------+-----------+----------+--------------+  SFJ       Full                                                             +---------+---------------+---------+-----------+----------+--------------+  FV Prox   Full                                                             +---------+---------------+---------+-----------+----------+--------------+  FV Mid    Full                                                             +---------+---------------+---------+-----------+----------+--------------+  FV Distal Full                                                             +---------+---------------+---------+-----------+----------+--------------+  PFV       Full                                                             +---------+---------------+---------+-----------+----------+--------------+  POP       Full            Yes       Yes                                    +---------+---------------+---------+-----------+----------+--------------+  PTV       Full                                                             +---------+---------------+---------+-----------+----------+--------------+  PERO      Full                                                             +---------+---------------+---------+-----------+----------+--------------+   +---------+---------------+---------+-----------+----------+--------------+  LEFT       Compressibility Phasicity Spontaneity Properties Thrombus Aging  +---------+---------------+---------+-----------+----------+--------------+  CFV       Full            Yes       Yes                                    +---------+---------------+---------+-----------+----------+--------------+  SFJ       Full                                                             +---------+---------------+---------+-----------+----------+--------------+  FV Prox   Full                                                             +---------+---------------+---------+-----------+----------+--------------+  FV Mid    Full                                                             +---------+---------------+---------+-----------+----------+--------------+  FV Distal Full                                                             +---------+---------------+---------+-----------+----------+--------------+  PFV       Full                                                             +---------+---------------+---------+-----------+----------+--------------+  POP       Full            Yes       Yes                                    +---------+---------------+---------+-----------+----------+--------------+  PTV       Full                                                             +---------+---------------+---------+-----------+----------+--------------+  PERO      Full                                                             +---------+---------------+---------+-----------+----------+--------------+     Summary: BILATERAL: - No evidence of deep vein thrombosis seen in the lower extremities, bilaterally. -No evidence of popliteal cyst, bilaterally.   *See table(s) above for measurements and observations. Electronically signed by Jamelle Haring on 08/02/2021 at 2:55:16 PM.    Final     PHYSICAL EXAM  Physical Exam  Constitutional: Appears well-developed and well-nourished.  Psych: Affect appropriate to situation Eyes: No  scleral injection HENT: No OP obstrucion MSK: no joint deformities.  Cardiovascular: Normal rate and regular rhythm.  Respiratory: Effort normal, non-labored breathing GI: Soft.  No distension. There is no tenderness.  Skin: WDI  Neuro: Mental Status: Patient is awake and interactive , oriented to person, place, month, year, and situation. Patient is able to give a clear and coherent history. Slight dysarthria. Cranial Nerves: II: Visual Fields are full. Pupils are equal, round, and reactive to light.   III,IV, VI: EOMI without ptosis or diploplia.  V: Facial sensation is symmetric to temperature VII: Left lower facial droop VIII: Hearing is intact to voice X: Palate elevates symmetrically XI: Shoulder shrug is symmetric. XII: Tongue protrudes midline without atrophy or fasciculations.  Motor: Tone is normal. Bulk is normal. 5/5 strength was present in RLE, LLE, RLE.  Left UE drift present, fine motor control impaired, hand grasp weak Sensory: Sensation is symmetric to light touch and temperature in the arms and legs. No extinction to DSS present.  Coordination: FNF and HKS are intact bilaterally  ASSESSMENT/PLAN Alyssa Mayo is a 76 y.o. female with history of diabetes, hypertension, hyperlipidemia, obesity, and sleep apnea presenting to [facility] after being found down by her son. Her last know well was approximately 07/29/2021. She may have been suffering with some upper respiratory symptoms, took some cough medicine and has not been like herself since Tuesday.  She also had not taken her diabetes medications.  She was found to be mildly hyperglycemic with blood sugars in the upper 200s.   Stroke:  Rt MCA infarct with hemorrhagic transformation, embolic pattern likely secondary unclear source, concerning for occult A. fib Code Stroke- infarct versus mass in the right MCA territory. Repeat Head CT- Unchanged right MCA territory infarct with petechial hemorrhage. CTA head &  neck- R M2 thrombus MRI- extensive restricted diffusion throughout much of the right MCA territory with  associated changes of hemorrhagic transformation and localized cerebral edema and mass-effect.. 2D Echo ejection fraction 60 to 65%. LDL 93 HgbA1c 7.4 Lower extremity Dopplers negative for DVT VTE prophylaxis - SCDs aspirin 81 mg daily prior to admission, now on No antithrombotic. Given hemorrhagic transformation. Therapy recommendations:  Pending Disposition:  Pending  Cerebral Edema with Mass Effect Hypertonic saline d/c'd overnight Na went from 135 to 145 in 10 hours Switch to NS 5ml/hr Infarct moderate size with only focal mass effect and rather mild MLS, not felt 3% saline needed  CT repeat 08/03/2021 : No interval progression of the right MCA hemorrhagic infarct with petechial hemorrhage. Hypertension Home meds:  None Start Amlodipine 10mg  and lisinopril 40 Stable Taper off Cleviprex as able BP goal <160 Long-term BP goal normotensive  Hyperlipidemia Home meds:  Simvastatin, resumed in hospital LDL 93, goal < 70 High intensity statin not indicated  On Lipitor 20 per pharmacy Continue statin at discharge  Diabetes type II Controlled Home meds:  Farxiga, Glipizide, metformin HgbA1c 7.4, goal < 7.0 CBGs SSI  Other Stroke Risk Factors Advanced Age >/= 28  Obesity, 36.49, BMI >/= 30 associated with increased stroke risk, recommend weight loss, diet and exercise as appropriate  Family hx stroke (mother, grandmother) Obstructive sleep apnea, on CPAP at home  Other Active Problems Pneumonia Treated for PNA outpatient  CXR- atelectasis Encourage good pulmonary hygiene with incentive spirometer and cough and deep breathing   Hospital day # 2 Continue mobilize out of bed.  Therapy consults.  Increase p.o. blood pressure medications and use as needed IV labetalol 20 mg daily 2 hourly and hydralazine 20 mg every 6 hourly and taper and stop Cleviprex drip.  Consult  medical hospitalist team resume care when she leaves the ICU.  Continue telemetry monitoring and she will need loop recorder at discharge to look for paroxysmal A. fib.  Long discussion with patient and family at the bedside and answered questions.  This patient is critically ill and at significant risk of neurological worsening, death and care requires constant monitoring of vital signs, hemodynamics,respiratory and cardiac monitoring, extensive review of multiple databases, frequent neurological assessment, discussion with family, other specialists and medical decision making of high complexity.I have made any additions or clarifications directly to the above note.This critical care time does not reflect procedure time, or teaching time or supervisory time of PA/NP/Med Resident etc but could involve care discussion time.  I spent 30 minutes of neurocritical care time  in the care of  this patient.   Antony Contras, MD Medical Director [place] Pager: [phone] 08/03/2021 12:18 PM      To contact Stroke Continuity provider, please refer to [email or web address] After hours, contact General Neurology

## 2021-08-04 LAB — GLUCOSE, CAPILLARY
Glucose-Capillary: 110 mg/dL — ABNORMAL HIGH (ref 70–99)
Glucose-Capillary: 113 mg/dL — ABNORMAL HIGH (ref 70–99)
Glucose-Capillary: 120 mg/dL — ABNORMAL HIGH (ref 70–99)
Glucose-Capillary: 167 mg/dL — ABNORMAL HIGH (ref 70–99)

## 2021-08-04 MED ORDER — ASPIRIN EC 81 MG PO TBEC
81.0000 mg | DELAYED_RELEASE_TABLET | Freq: Every day | ORAL | Status: DC
  Administered 2021-08-04 – 2021-08-07 (×4): 81 mg via ORAL
  Filled 2021-08-04 (×4): qty 1

## 2021-08-04 NOTE — Progress Notes (Signed)
Inpatient Rehab Admissions: ° °Inpatient Rehab Consult received.  I met with patient at the bedside for rehabilitation assessment and to discuss goals and expectations of an inpatient rehab admission.  Pt acknowledged understanding of CIR goals and expectations. Pt interested in pursuing CIR. Pt gave permission to contact son, Greg. Spoke with Greg on the telephone. He also acknowledged understanding and is supportive of pt pursuing CIR. Will continue to follow. ° °Signed: ° Graves Madden, MS, CCC-SLP °Admissions Coordinator °[phone] ° ° °

## 2021-08-04 NOTE — Progress Notes (Addendum)
STROKE TEAM PROGRESS NOTE   INTERVAL HISTORY Her son Alyssa Mayo is at the bedside.  She is lying in bed comfortably and is awake and interactive.  Blood pressure adequately controlled.  Neurological exam is unchanged.  Vital signs are stable.  She has been evaluated by therapy teams who recommended inpatient rehab.  Vitals:   08/04/21 0139 08/04/21 0322 08/04/21 0800 08/04/21 1141  BP: (!) 135/47 (!) 138/51  (!) 142/66  Pulse: 75 77 99 87  Resp: 20 (!) 21 20   Temp: 98.6 F (37 C) 98.5 F (36.9 C) 98.5 F (36.9 C) 98.4 F (36.9 C)  TempSrc: Oral Oral Oral Oral  SpO2: 98% 94% 96% 91%   CBC:  Recent Labs  Lab 08/01/21 1019 08/02/21 1151  WBC 14.0* 13.9*  NEUTROABS 11.0*  --   HGB 14.1 13.8  HCT 44.2 44.8  MCV 89.1 90.9  PLT 308 312   Basic Metabolic Panel:  Recent Labs  Lab 08/01/21 1019 08/01/21 1458 08/01/21 2315 08/02/21 1151  NA 137   < > 145 141  K 4.2  --   --  4.3  CL 101  --   --  111  CO2 24  --   --  17*  GLUCOSE 113*  --   --  98  BUN 31*  --   --  26*  CREATININE 1.17*  --   --  1.05*  CALCIUM 9.5  --   --  8.9   < > = values in this interval not displayed.   Lipid Panel:  Recent Labs  Lab 08/02/21 0341 08/03/21 0542  CHOL 182  --   TRIG 289* 243*  HDL 31*  --   CHOLHDL 5.9  --   VLDL 58*  --   LDLCALC 93  --    HgbA1c:  Recent Labs  Lab 08/02/21 0341  HGBA1C 7.4*   Urine Drug Screen:  Recent Labs  Lab 08/01/21 1821  LABOPIA NONE DETECTED  COCAINSCRNUR NONE DETECTED  LABBENZ NONE DETECTED  AMPHETMU NONE DETECTED  THCU NONE DETECTED  LABBARB NONE DETECTED    Alcohol Level  Recent Labs  Lab 08/01/21 1019  ETH <10    IMAGING past 24 hours No results found.  PHYSICAL EXAM  Physical Exam  Constitutional: Appears well-developed and well-nourished.  Pleasant elderly Caucasian lady Psych: Affect appropriate to situation Eyes: No scleral injection HENT: No OP obstrucion MSK: no joint deformities.  Cardiovascular: Normal rate and  regular rhythm.  Respiratory: Effort normal, non-labored breathing GI: Soft.  No distension. There is no tenderness.  Skin: WDI  Neuro: Mental Status: Patient is awake and interactive , oriented to person, place, month, year, and situation. Patient is able to give a clear and coherent history. Slight dysarthria. Cranial Nerves: II: Visual Fields are full. Pupils are equal, round, and reactive to light.   III,IV, VI: EOMI without ptosis or diploplia.  V: Facial sensation is symmetric to temperature VII: Left lower facial droop VIII: Hearing is intact to voice X: Palate elevates symmetrically XI: Shoulder shrug is symmetric. XII: Tongue protrudes midline without atrophy or fasciculations.  Motor: Tone is normal. Bulk is normal. 5/5 strength was present in RLE, LLE, RLE.  Left UE drift present, fine motor control impaired, hand grasp weak Sensory: Sensation is symmetric to light touch and temperature in the arms and legs. No extinction to DSS present.  Coordination: FNF and HKS are intact bilaterally  ASSESSMENT/PLAN [name] is a 76 y.o.  female with history of diabetes, hypertension, hyperlipidemia, obesity, and sleep apnea presenting to [facility] after being found down by her son. Her last know well was approximately 07/29/2021. She may have been suffering with some upper respiratory symptoms, took some cough medicine and has not been like herself since Tuesday.  She also had not taken her diabetes medications.  She was found to be mildly hyperglycemic with blood sugars in the upper 200s.   Stroke:  Rt MCA infarct with hemorrhagic transformation, embolic pattern likely secondary unclear source, concerning for occult A. fib Code Stroke- infarct versus mass in the right MCA territory. Repeat Head CT- Unchanged right MCA territory infarct with petechial hemorrhage. CTA head & neck- R M2 thrombus MRI- extensive restricted diffusion throughout much of the right MCA territory with  associated changes of hemorrhagic transformation and localized cerebral edema and mass-effect.. 2D Echo ejection fraction 60 to 65%. LDL 93 HgbA1c 7.4 Lower extremity Dopplers negative for DVT VTE prophylaxis - SCDs aspirin 81 mg daily prior to admission, now on No antithrombotic. Given hemorrhagic transformation. Therapy recommendations:  Pending Disposition:  Pending  Cerebral Edema with Mass Effect Hypertonic saline d/c'd overnight Na went from 135 to 145 in 10 hours Switch to NS 35ml/hr Infarct moderate size with only focal mass effect and rather mild MLS, not felt 3% saline needed  CT repeat 08/03/2021 : No interval progression of the right MCA hemorrhagic infarct with petechial hemorrhage. Hypertension Home meds:  None Start Amlodipine 10mg  and lisinopril 40 Stable Taper off Cleviprex as able BP goal <160 Long-term BP goal normotensive  Hyperlipidemia Home meds:  Simvastatin, resumed in hospital LDL 93, goal < 70 High intensity statin not indicated  On Lipitor 20 per pharmacy Continue statin at discharge  Diabetes type II Controlled Home meds:  Farxiga, Glipizide, metformin HgbA1c 7.4, goal < 7.0 CBGs SSI  Other Stroke Risk Factors Advanced Age >/= 24  Obesity, 36.49, BMI >/= 30 associated with increased stroke risk, recommend weight loss, diet and exercise as appropriate  Family hx stroke (mother, grandmother) Obstructive sleep apnea, on CPAP at home  Other Active Problems Pneumonia Treated for PNA outpatient  CXR- atelectasis Encourage good pulmonary hygiene with incentive spirometer and cough and deep breathing   Hospital day # 3 Continue mobilize out of bed.  Continue ongoing therapies.  Appreciate medical hospitalist team assuming care.  Inpatient rehab consult.  Continue telemetry monitoring and she will need loop recorder at discharge to look for paroxysmal A. fib.  Start aspirin 81 mg daily and we will not do dual antiplatelet therapy due to hemorrhagic  transformation.  Long discussion with patient and son at the bedside and answered questions. Greater than 50% time during this 25-minute visit was spent in counseling and coordination of care of hemorrhagic stroke discussion about stroke evaluation, prevention and treatment  75, MD Medical Director [facility] Pager: [phone] 08/04/2021 12:42 PM      To contact Stroke Continuity provider, please refer to 08/06/2021. After hours, contact General Neurology

## 2021-08-04 NOTE — Progress Notes (Signed)
Patient refused the use of cpap for the evening.  

## 2021-08-04 NOTE — Progress Notes (Signed)
Subjective: Feeling good no complaints  Objective: Vital signs in last 24 hours: Temp:  [98.1 F (36.7 C)-99.2 F (37.3 C)] 98.5 F (36.9 C) (01/15 0842) Pulse Rate:  [74-99] 99 (01/15 0842) Resp:  [18-31] 20 (01/15 0800) BP: (114-153)/(36-56) 138/51 (01/15 0322) SpO2:  [91 %-99 %] 96 % (01/15 0842) Weight change:  Last BM Date: 08/03/21  Intake/Output from previous day: 01/14 0701 - 01/15 0700 In: 384.7 [P.O.:240; I.V.:144.7] Out: 600 [Urine:600] Intake/Output this shift: No intake/output data recorded.  General appearance: alert and cooperative Head: Normocephalic, without obvious abnormality, atraumatic Neck: no adenopathy, no carotid bruit, no JVD, supple, symmetrical, trachea midline, and thyroid not enlarged, symmetric, no tenderness/mass/nodules Resp: clear to auscultation bilaterally Cardio: regular rate and rhythm, S1, S2 normal, no murmur, click, rub or gallop GI: soft, non-tender; bowel sounds normal; no masses,  no organomegaly Extremities: extremities normal, atraumatic, no cyanosis or edema Skin: Skin color, texture, turgor normal. No rashes or lesions  Lab Results: Recent Labs    08/02/21 1151  WBC 13.9*  HGB 13.8  HCT 44.8  PLT 312   BMET Recent Labs    08/01/21 2315 08/02/21 1151  NA 145 141  K  --  4.3  CL  --  111  CO2  --  17*  GLUCOSE  --  98  BUN  --  26*  CREATININE  --  1.05*  CALCIUM  --  8.9    Studies/Results: CT HEAD WO CONTRAST ( )  Result Date: 08/03/2021 CLINICAL DATA:  Stroke follow-up EXAM: CT HEAD WITHOUT CONTRAST TECHNIQUE: Contiguous axial images were obtained from the base of the skull through the vertex without intravenous contrast. RADIATION DOSE REDUCTION: This exam was performed according to the departmental dose-optimization program which includes automated exposure control, adjustment of the mA and/or kV according to patient size and/or use of iterative reconstruction technique. COMPARISON:  Yesterday FINDINGS:  Brain: Right MCA territory infarction which is most confluent at the insula and operculum with petechial hemorrhage. No evidence of progression. No worrisome mass effect, collection, or hydrocephalus. Vascular: No hyperdense vessel or unexpected calcification. Skull: Normal. Negative for fracture or focal lesion. Sinuses/Orbits: Bilateral cataract resection IMPRESSION: No interval progression of the recent right MCA infarct with petechial hemorrhage. Electronically Signed   By: Tiburcio Pea M.D.   On: 08/03/2021 04:10   ECHOCARDIOGRAM COMPLETE  Result Date: 08/02/2021    ECHOCARDIOGRAM REPORT   Patient Name:   GLOSSIE GRAUBERGER Date of Exam: 08/02/2021 Medical Rec #:  [id number]          Height:       63.0 in Accession #:    [id number]         Weight:       206.0 lb Date of Birth:  1945-10-20           BSA:          1.958 m Patient Age:    75 years           BP:           131/51 mmHg Patient Gender: F                  HR:           96 bpm. Exam Location:  Inpatient Procedure: 2D Echo Indications:     Stroke  History:         Patient has no prior history of Echocardiogram examinations.  Risk Factors:Hypertension and Diabetes.  Sonographer:     Eduard Roux Referring Phys:  [id number] XU Diagnosing Phys: Laurance Flatten MD  Sonographer Comments: Technically difficult study due to poor echo windows. Image acquisition challenging due to patient body habitus and Image acquisition challenging due to uncooperative patient. Contrast was not used due to patients poor temperment and her request to end the exam. IMPRESSIONS  1. Technically difficult study with poor echo windows. Patient declined Definity contrast.  2. Left ventricular ejection fraction, by estimation, is 55 to 60%. The left ventricle has normal function. Left ventricular endocardial border not optimally defined to evaluate regional wall motion. Left ventricular diastolic parameters are consistent with Grade I diastolic  dysfunction (impaired relaxation).  3. Right ventricular systolic function is normal. The right ventricular size is normal. The RV wall appears thickened, however, this may be due to a prominent fat pad.  4. The mitral valve is normal in structure. Trivial mitral valve regurgitation.  5. The aortic valve is tricuspid. There is mild calcification of the aortic valve. There is mild thickening of the aortic valve. Aortic valve regurgitation is not visualized. Aortic valve sclerosis/calcification is present, without any evidence of aortic stenosis.  6. The inferior vena cava is normal in size with greater than 50% respiratory variability, suggesting right atrial pressure of 3 mmHg. Conclusion(s)/Recommendation(s): No intracardiac source of embolism detected on this transthoracic study. Consider a transesophageal echocardiogram to exclude cardiac source of embolism if clinically indicated. FINDINGS  Left Ventricle: Left ventricular ejection fraction, by estimation, is 55 to 60%. The left ventricle has normal function. Left ventricular endocardial border not optimally defined to evaluate regional wall motion. The left ventricular internal cavity size was normal in size. There is no left ventricular hypertrophy. Left ventricular diastolic parameters are consistent with Grade I diastolic dysfunction (impaired relaxation). Right Ventricle: The right ventricular size is normal. Right vetricular wall thickness was not well visualized. Right ventricular systolic function is normal. Left Atrium: Left atrial size was not well visualized. Right Atrium: Right atrial size was not well visualized. Pericardium: Trivial pericardial effusion is present. Presence of epicardial fat layer. Mitral Valve: The mitral valve is normal in structure. Trivial mitral valve regurgitation. Tricuspid Valve: The tricuspid valve is normal in structure. Tricuspid valve regurgitation is trivial. Aortic Valve: The aortic valve is tricuspid. There is mild  calcification of the aortic valve. There is mild thickening of the aortic valve. Aortic valve regurgitation is not visualized. Aortic valve sclerosis/calcification is present, without any evidence of aortic stenosis. Aortic valve peak gradient measures 23.4 mmHg. Pulmonic Valve: The pulmonic valve was normal in structure. Pulmonic valve regurgitation is trivial. Aorta: The aortic root and ascending aorta are structurally normal, with no evidence of dilitation. Venous: The inferior vena cava is normal in size with greater than 50% respiratory variability, suggesting right atrial pressure of 3 mmHg. IAS/Shunts: The atrial septum is grossly normal.  LEFT VENTRICLE PLAX 2D LVIDd:         5.10 cm   Diastology LVIDs:         4.10 cm   LV e' medial:    6.16 cm/s LV PW:         1.10 cm   LV E/e' medial:  16.9 LV IVS:        1.00 cm   LV e' lateral:   7.87 cm/s LVOT diam:     2.00 cm   LV E/e' lateral: 13.2 LV SV:  100 LV SV Index:   51 LVOT Area:     3.14 cm  IVC IVC diam: 1.70 cm LEFT ATRIUM           Index        RIGHT ATRIUM           Index LA Vol (A2C): 63.7 ml 32.53 ml/m  RA Area:     11.70 cm LA Vol (A4C): 38.6 ml 19.71 ml/m  RA Volume:   19.90 ml  10.16 ml/m  AORTIC VALVE                  PULMONIC VALVE AV Area (Vmax): 2.03 cm      PV Vmax:       0.89 m/s AV Vmax:        242.00 cm/s   PV Peak grad:  3.1 mmHg AV Peak Grad:   23.4 mmHg LVOT Vmax:      156.00 cm/s LVOT Vmean:     107.000 cm/s LVOT VTI:       0.319 m  AORTA Ao Asc diam: 3.10 cm MITRAL VALVE MV Area (PHT): 3.79 cm     SHUNTS MV Decel Time: 200 msec     Systemic VTI:  0.32 m MV E velocity: 104.00 cm/s  Systemic Diam: 2.00 cm MV A velocity: 111.00 cm/s MV E/A ratio:  0.94 Laurance Flatten MD Electronically signed by Laurance Flatten MD Signature Date/Time: 08/02/2021/1:38:41 PM    Final (Updated)    VAS [place] LOWER EXTREMITY VENOUS (DVT)  Result Date: 08/02/2021  Lower Venous DVT Study Patient Name:  Catlynn Grondahl  Date of Exam:    08/02/2021 Medical Rec #: [id number]           Accession #:    [id number] Date of Birth:                     Patient Gender: Patient Age: Exam Location:  [facility] Procedure:      VAS [place] LOWER EXTREMITY VENOUS (DVT) Referring Phys: --------------------------------------------------------------------------------  Indications: Stroke.  Comparison Study: no prior Performing Technologist: [place] Ponder RVS  Examination Guidelines: A complete evaluation includes B-mode imaging, spectral Doppler, color Doppler, and power Doppler as needed of all accessible portions of each vessel. Bilateral testing is considered an integral part of a complete examination. Limited examinations for reoccurring indications may be performed as noted. The reflux portion of the exam is performed with the patient in reverse Trendelenburg.  +---------+---------------+---------+-----------+----------+--------------+  RIGHT     Compressibility Phasicity Spontaneity Properties Thrombus Aging  +---------+---------------+---------+-----------+----------+--------------+  CFV       Full            Yes       Yes                                    +---------+---------------+---------+-----------+----------+--------------+  SFJ       Full                                                             +---------+---------------+---------+-----------+----------+--------------+  FV Prox   Full                                                             +---------+---------------+---------+-----------+----------+--------------+  FV Mid    Full                                                             +---------+---------------+---------+-----------+----------+--------------+  FV Distal Full                                                             +---------+---------------+---------+-----------+----------+--------------+  PFV       Full                                                              +---------+---------------+---------+-----------+----------+--------------+  POP       Full            Yes       Yes                                    +---------+---------------+---------+-----------+----------+--------------+  PTV       Full                                                             +---------+---------------+---------+-----------+----------+--------------+  PERO      Full                                                             +---------+---------------+---------+-----------+----------+--------------+   +---------+---------------+---------+-----------+----------+--------------+  LEFT      Compressibility Phasicity Spontaneity Properties Thrombus Aging  +---------+---------------+---------+-----------+----------+--------------+  CFV       Full            Yes       Yes                                    +---------+---------------+---------+-----------+----------+--------------+  SFJ       Full                                                             +---------+---------------+---------+-----------+----------+--------------+  FV Prox   Full                                                             +---------+---------------+---------+-----------+----------+--------------+  FV Mid    Full                                                             +---------+---------------+---------+-----------+----------+--------------+  FV Distal Full                                                             +---------+---------------+---------+-----------+----------+--------------+  PFV       Full                                                             +---------+---------------+---------+-----------+----------+--------------+  POP       Full            Yes       Yes                                    +---------+---------------+---------+-----------+----------+--------------+  PTV       Full                                                              +---------+---------------+---------+-----------+----------+--------------+  PERO      Full                                                             +---------+---------------+---------+-----------+----------+--------------+     Summary: BILATERAL: - No evidence of deep vein thrombosis seen in the lower extremities, bilaterally. -No evidence of popliteal cyst, bilaterally.   *See table(s) above for measurements and observations. Electronically signed by Heath Lark on 08/02/2021 at 2:55:16 PM.    Final     Medications: I have reviewed the patient's current medications.  Assessment/Plan: Ms. Azaylah Stailey is a 76 y.o. female with history of diabetes, hypertension, hyperlipidemia, obesity, and sleep apnea presenting to [facility] after being found down by her son. Her last know well was approximately 07/29/2021. She may have been suffering with some upper respiratory symptoms, took some cough medicine and has not been like herself since Tuesday.  She also had not taken her diabetes medications.  She was found to be mildly hyperglycemic with blood sugars in the upper 200s.    Stroke:  Rt MCA infarct with hemorrhagic transformation, embolic pattern likely secondary unclear source, concerning for occult A. fib Code Stroke- infarct versus mass in the right MCA territory. Repeat Head CT- Unchanged right MCA territory infarct with petechial hemorrhage. CTA head & neck- R M2 thrombus  MRI- extensive restricted diffusion throughout much of the right MCA territory with associated changes of hemorrhagic transformation and localized cerebral edema and mass-effect.. 2D Echo ejection fraction 60 to 65%. LDL 93 HgbA1c 7.4 Lower extremity Dopplers negative for DVT VTE prophylaxis - SCDs aspirin 81 mg daily prior to admission, now on No antithrombotic. Given hemorrhagic transformation. Stroke neuro team following and managing   Cerebral Edema with Mass Effect Hypertonic saline d/c'd overnight Na went from 135 to  145 in 10 hours Switch to NS 24ml/hr Infarct moderate size with only focal mass effect and rather mild MLS, not felt 3% saline needed  CT repeat 08/03/2021 : No interval progression of the right MCA hemorrhagic infarct with petechial hemorrhage.  Hypertension Home meds:  None Start Amlodipine 10mg  and lisinopril 40 Stable off Cleviprex BP goal <160 Long-term BP goal normotensive   Hyperlipidemia Home meds:  Simvastatin, resumed in hospital LDL 93, goal < 70 High intensity statin not indicated  On Lipitor 20 per pharmacy Continue statin at discharge   Diabetes type II Controlled Home meds:  Farxiga, Glipizide, metformin HgbA1c 7.4, goal < 7.0 CBGs SSI   Other Stroke Risk Factors Advanced Age >/= 52  Obesity, 36.49, BMI >/= 30 associated with increased stroke risk, recommend weight loss, diet and exercise as appropriate  Family hx stroke (mother, grandmother) Obstructive sleep apnea, on CPAP at home   Other Active Problems Pneumonia Treated for PNA outpatient  CXR- atelectasis Encourage good pulmonary hygiene with incentive spirometer and cough and deep breathing    Awaiting final dispo per neuro stroke team.  Pt declining rehab.  Reports is ambulating.  ??loop recorder per neuro however not on antiplatelet due to hemorrhagic conversion.  Ready for discharge unless further work up cannot be done outpatient.  Contacted cardiology team as not done yesterday upon transfer out of ICU.    LOS: 3 days   Lorey Pallett A 08/04/2021, 10:27 AM

## 2021-08-04 NOTE — PMR Pre-admission (Signed)
PMR Admission Coordinator Pre-Admission Assessment  Patient: Alyssa Mayo is an 76 y.o., female MRN: [id number] DOB: 1945/09/08 Height:   Weight:    Insurance Information HMO:     PPO: yes     PCP:      IPA:      80/20:      OTHER:  PRIMARY: [employer]      Policy#: [id number]      Subscriber: patient CM Name: Marcene Brawn       Phone#: [phone]     Fax#: Laird with [employer] called with approval on 08/07/21 for admission 1/18-1/24/23 with updates due 9/89 Pre-Cert#: [id number]      Employer:  Benefits:  Phone #: [phone]     Name:  Eff. Date: 07/21/20-still active      Deduct: does not have one   Out of Pocket Max: $1,000 ($10 met)    Life Max: NA CIR: $150 co-pay/admission      SNF: 100% coverage Outpatient: $20/visit co-pay     Co-Pay:  Home Health: 100% coverage      Co-Pay:  DME: 100% coverage     Co-Pay:  Providers: in-network SECONDARY:       Policy#:      Phone#:   Development worker, community:       Phone#:   The Actuary for patients in Inpatient Rehabilitation Facilities with attached Privacy Act [place] Records was provided and verbally reviewed with: Patient  Emergency Contact Information Contact Information     Name Relation Home Work [place] Son [phone]  [phone]       Current Medical History  Patient Admitting Diagnosis: R CVA History of Present Illness: Pt is a 76 year old female with medical hx significant for: HTN, diabetes, hyperlipidemia, obesity, macular degeneration, sleep apnea. Pt presented to [facility] on 08/01/21 d/t being found on the floor with left-sided weakness. Pt not a candidate for tPA or TNKase. CT head concerning for infarct versus mass in right MCA territory. MRI revealed right MCA embolic stroke with petechial hemorrhages.  Pt transferred to [facility] for further care. CT angiogram with nonocclusive filling defect in right MCA  bifurcation and irregular and diminished flow in inferior division of right M2. EF 60 to 65%. LE venous Doppler negative for DVT.  Therapy evaluations completed and CIR recommended d/t pt's deficits in functional mobility and ability to complete ADLs independently.  Complete NIHSS TOTAL: 4  Patient's medical record from [facility] has been reviewed by the rehabilitation admission coordinator and physician.  Past Medical History  Past Medical History:  Diagnosis Date   Abnormal levels of other serum enzymes 07/17/2015   Arthritis    Constipation 07/11/2015   COVID-19 03/06/2021   Diabetes mellitus without complication (HCC)    Elevated liver enzymes    Fatty liver    Generalized abdominal pain 07/11/2015   Hyperlipidemia    Hypertension    Lifelong obesity    Macular degeneration    Morbid obesity due to excess calories ([place]) 07/11/2015   Other fatigue 07/11/2015   Sleep apnea    Spinal headache     Has the patient had major surgery during 100 days prior to admission? No  Family History   family history includes Cancer in her brother, father, and mother; Colon cancer in her mother; Dementia in her paternal grandmother; Diabetes in her brother and mother; Heart disease in her mother; Leukemia in her father; Ovarian cancer  in her sister; Stroke in her father, maternal grandfather, and mother.  Current Medications  Current Facility-Administered Medications:    acetaminophen (TYLENOL) tablet 650 mg, 650 mg, Oral, Q4H PRN, 650 mg at 08/03/21 0005 **OR** acetaminophen (TYLENOL) 160 MG/5ML solution 650 mg, 650 mg, Per Tube, Q4H PRN **OR** acetaminophen (TYLENOL) suppository 650 mg, 650 mg, Rectal, Q4H PRN, Amie Portland, MD   albuterol (PROVENTIL) (2.5 MG/3ML) 0.083% nebulizer solution 2.5 mg, 2.5 mg, Inhalation, Q6H PRN, Rosalin Hawking, MD   amLODipine (NORVASC) tablet 10 mg, 10 mg, Oral, Daily, Rosalin Hawking, MD, 10 mg at 08/05/21 7169   aspirin EC tablet 81 mg, 81 mg, Oral, Daily,  Leonie Man, Pramod S, MD, 81 mg at 08/05/21 0902   atorvastatin (LIPITOR) tablet 20 mg, 20 mg, Oral, Daily, Rosalin Hawking, MD, 20 mg at 08/05/21 0902   labetalol (NORMODYNE) injection 10 mg, 10 mg, Intravenous, Q2H PRN, Charlean Merl, [place], NP   lisinopril (ZESTRIL) tablet 40 mg, 40 mg, Oral, Daily, Rosalin Hawking, MD, 40 mg at 08/05/21 0902   pantoprazole (PROTONIX) EC tablet 40 mg, 40 mg, Oral, Daily, Rosalin Hawking, MD, 40 mg at 08/05/21 0902   senna-docusate (Senokot-S) tablet 1 tablet, 1 tablet, Oral, QHS PRN, Amie Portland, MD  Patients Current Diet:  Diet Order             Diet - low sodium heart healthy           Diet heart healthy/carb modified Room service appropriate? Yes; Fluid consistency: Thin  Diet effective now                   Precautions / Restrictions Precautions Precautions: Fall Precaution Comments: SBP<160 Restrictions Weight Bearing Restrictions: No   Has the patient had 2 or more falls or a fall with injury in the past year? No  Prior Activity Level Limited Community (1-2x/wk): gets out of hosue 3-4 days/week  Prior Functional Level Self Care: Did the patient need help bathing, dressing, using the toilet or eating? Independent  Indoor Mobility: Did the patient need assistance with walking from room to room (with or without device)? Independent  Stairs: Did the patient need assistance with internal or external stairs (with or without device)? Independent  Functional Cognition: Did the patient need help planning regular tasks such as shopping or remembering to take medications? Independent  Patient Information Are you of Hispanic, Latino/a,or Spanish origin?: A. No, not of Hispanic, Latino/a, or Spanish origin What is your race?: A. White Do you need or want an interpreter to communicate with a doctor or health care staff?: 0. No  Patient's Response To:  Health Literacy and Transportation Is the patient able to respond to health literacy and transportation  needs?: Yes Health Literacy - How often do you need to have someone help you when you read instructions, pamphlets, or other written material from your doctor or pharmacy?: Never In the past 12 months, has lack of transportation kept you from medical appointments or from getting medications?: No In the past 12 months, has lack of transportation kept you from meetings, work, or from getting things needed for daily living?: No  Development worker, international aid / Equipment Home Equipment: BSC/3in1, Radio producer - single point  Prior Device Use: Indicate devices/aids used by the patient prior to current illness, exacerbation or injury?  cane  Current Functional Level Cognition  Arousal/Alertness: Lethargic Overall Cognitive Status: Impaired/Different from baseline Current Attention Level: Sustained Orientation Level: Oriented to person, Oriented to time, Oriented to situation Following Commands:  Follows one step commands inconsistently Safety/Judgement: Decreased awareness of safety, Decreased awareness of deficits General Comments: Lethargic and required mod-max cues for all sequencing and problem solving and had difficulty following commands. Pt was not attending to the L side well, however seems slightly improved from OT session earlier today. Attention: Sustained Sustained Attention: Appears intact Selective Attention: Appears intact Memory: Appears intact Awareness: Impaired Awareness Impairment: Emergent impairment Problem Solving: Impaired Problem Solving Impairment: Verbal complex, Functional complex Executive Function: Self Monitoring Self Monitoring: Impaired Self Monitoring Impairment: Functional complex    Extremity Assessment (includes Sensation/Coordination)  Upper Extremity Assessment: LUE deficits/detail LUE Deficits / Details: AROM is [facility] for hand wrist and elbow, limited shoulder ROM. Pt denies numbness/tingingly. She does not attend well to LUE. She was able to sustain grip on RW,  unable to use functionally for grooming at the sink. Globally 3-/5 MMT. Pt leaving L hand and wrist in unsafe positions LUE Sensation: decreased proprioception LUE Coordination: decreased fine motor, decreased gross motor  Lower Extremity Assessment: Defer to PT evaluation    ADLs  Overall ADL's : Needs assistance/impaired Grooming: Oral care, Brushing hair, Minimal assistance, Standing Grooming Details (indicate cue type and reason): min A for cues, packaging and management of LUE Upper Body Bathing: Minimal assistance, Sitting Lower Body Bathing: Moderate assistance, Sit to/from stand Upper Body Dressing : Minimal assistance, Sitting Lower Body Dressing: Moderate assistance, Sit to/from stand Toilet Transfer: Minimal assistance, Regular Toilet, Rolling walker (2 wheels), Cueing for safety, Cueing for sequencing Toilet Transfer Details (indicate cue type and reason): Min A for managment of RW and cues Toileting- Clothing Manipulation and Hygiene: Min guard, Sitting/lateral lean Toileting - Clothing Manipulation Details (indicate cue type and reason): required cue to located toilet paper to pt's L side Functional mobility during ADLs: Minimal assistance, Rolling walker (2 wheels) General ADL Comments: Pt limited by lethargic, cognition and inattention this session. Pt ran into all barriers in her R side adn required max cues to correct for safety. Cue f orproblem solving to complete ADLs wihtout use of LUE. & assist for LUE management and safe/supoprtive positioning    Mobility  Overal bed mobility: Needs Assistance Bed Mobility: Supine to Sit, Sit to Supine Supine to sit: Mod assist Sit to supine: Min guard General bed mobility comments: HOB flat and heavy use of rails. Pt reaching for therapist's hand to elevate trunk to full sitting position. Several rounds of VC's for pt to scoot around and get both feet on the floor.    Transfers  Overall transfer level: Needs assistance Equipment  used: Rolling walker (2 wheels), None Transfers: Sit to/from Stand Sit to Stand: Min assist Bed to/from chair/wheelchair/BSC transfer type:: Step pivot Step pivot transfers: Min assist General transfer comment: Assist to power up to full stand, to gain/maintain standing balance, and to place L hand onto the walker (VC's to attend to the L side).    Ambulation / Gait / Stairs / Wheelchair Mobility  Ambulation/Gait Ambulation/Gait assistance: Herbalist (Feet): 25 Feet Assistive device: 1 person hand held assist, Rolling walker (2 wheels) Gait Pattern/deviations: Step-through pattern, Decreased stride length General Gait Details: Pt insisting on using the RW for support however with difficulty keeping the L hand on the walker and pt with increased difficulty attending to the L side. Gait was improved with HHA, however pt continued to have difficulty attending to the L. Gait velocity: decreased    Posture / Balance Balance Overall balance assessment: Needs assistance Sitting-balance support: Feet  supported Sitting balance-Leahy Scale: Fair Standing balance support: Single extremity supported, During functional activity Standing balance-Leahy Scale: Fair Standing balance comment: stood at the sink to groom    Special needs/care consideration NA   Previous Home Environment (from acute therapy documentation) Living Arrangements: Children  Lives With: Son, Family Available Help at Discharge: Family, Available 24 hours/day Type of Home: House Home Layout: Two level, Able to live on main level with bedroom/bathroom Home Access: Stairs to enter Entrance Stairs-Rails: Can reach both Entrance Stairs-Number of Steps: 4 Bathroom Shower/Tub: Multimedia programmer: Handicapped height Bathroom Accessibility: Yes How Accessible: Accessible via [place]: No  Discharge Living Setting Plans for Discharge Living Setting: Patient's home Type of Home at  Discharge: [place]: Two level, Able to live on main level with bedroom/bathroom Discharge Home Access: Stairs to enter Entrance Stairs-Rails: Can reach both Entrance Stairs-Number of Steps: 4 Discharge Bathroom Shower/Tub: Walk-in shower Discharge Bathroom Toilet: Handicapped height Discharge Bathroom Accessibility: Yes How Accessible: Accessible via walker Does the patient have any problems obtaining your medications?: No  Social/Family/Support Systems Patient Roles: Parent Anticipated Caregiver: Casmira Cramer, son and other family Anticipated Caregiver's Contact Information: [phone] Caregiver Availability: 24/7 Discharge Plan Discussed with Primary Caregiver: Yes Is Caregiver In Agreement with Plan?: Yes Does Caregiver/Family have Issues with Lodging/Transportation while Pt is in Rehab?: No  Goals Patient/Family Goal for Rehab: Supervision: PT/OT/ST Expected length of stay: 7-10 days Pt/Family Agrees to Admission and willing to participate: Yes Program Orientation Provided & Reviewed with Pt/Caregiver Including Roles  & Responsibilities: Yes  Decrease burden of Care through IP rehab admission: NA  Possible need for SNF placement upon discharge: Not anticipated  Patient Condition: I have reviewed medical records from [facility], spoken with CM, and patient and son. I met with patient at the bedside and discussed via phone for inpatient rehabilitation assessment.  Patient will benefit from ongoing PT, OT, and SLP, can actively participate in 3 hours of therapy a day 5 days of the week, and can make measurable gains during the admission.  Patient will also benefit from the coordinated team approach during an Inpatient Acute Rehabilitation admission.  The patient will receive intensive therapy as well as Rehabilitation physician, nursing, social worker, and care management interventions.  Due to safety, disease management, medication administration, and  patient education the patient requires 24 hour a day rehabilitation nursing.  The patient is currently Min A with mobility and basic ADLs.  Discharge setting and therapy post discharge at home with home health is anticipated.  Patient has agreed to participate in the Acute Inpatient Rehabilitation Program and will admit today.  Preadmission Screen Completed By:  Bethel Born, 08/05/2021 4:16 PM ______________________________________________________________________   Discussed status with Dr. Ranell Patrick  on 08/07/21 at 30 and received approval for admission today.  Admission Coordinator:  Bethel Born, [place],  with updates by Clemens Catholic, MS, CCC-SLP time 1340 /Date 08/07/21   Assessment/Plan: Diagnosis: CVA Does the need for close, 24 hr/day Medical supervision in concert with the patient's rehab needs make it unreasonable for this patient to be served in a less intensive setting? Yes Co-Morbidities requiring supervision/potential complications: morbid obesity, hypertension, GERD, cerebral edema, OSA Due to bladder management, bowel management, safety, skin/wound care, disease management, medication administration, pain management, and patient education, does the patient require 24 hr/day rehab nursing? Yes Does the patient require coordinated care of a physician, rehab nurse, PT, OT, and SLP to address physical  and functional deficits in the context of the above medical diagnosis(es)? Yes Addressing deficits in the following areas: balance, endurance, locomotion, strength, transferring, bowel/bladder control, bathing, dressing, feeding, grooming, toileting, and cognition Can the patient actively participate in an intensive therapy program of at least 3 hrs of therapy 5 days a week? Yes The potential for patient to make measurable gains while on inpatient rehab is excellent Anticipated functional outcomes upon discharge from inpatient rehab: modified independent PT, supervision  OT, supervision SLP Estimated rehab length of stay to reach the above functional goals is: 5-7 days Anticipated discharge destination: Home 10. Overall Rehab/Functional Prognosis: excellent   MD Signature: Leeroy Cha, MD

## 2021-08-05 ENCOUNTER — Encounter (HOSPITAL_COMMUNITY)
Admission: AD | Disposition: A | Discharge status: Rehabilitation Facility | Payer: Self-pay | Source: Intra-hospital | Attending: Family Medicine

## 2021-08-05 DIAGNOSIS — I639 Cerebral infarction, unspecified: Secondary | ICD-10-CM | POA: Diagnosis not present

## 2021-08-05 HISTORY — PX: LOOP RECORDER INSERTION: EP1214

## 2021-08-05 LAB — GLUCOSE, CAPILLARY
Glucose-Capillary: 127 mg/dL — ABNORMAL HIGH (ref 70–99)
Glucose-Capillary: 143 mg/dL — ABNORMAL HIGH (ref 70–99)
Glucose-Capillary: 183 mg/dL — ABNORMAL HIGH (ref 70–99)

## 2021-08-05 SURGERY — LOOP RECORDER INSERTION

## 2021-08-05 MED ORDER — LISINOPRIL 40 MG PO TABS: 40.0000 mg | ORAL_TABLET | Freq: Every day | ORAL | 0 refills | Status: DC

## 2021-08-05 MED ORDER — AMLODIPINE BESYLATE 10 MG PO TABS: 10.0000 mg | ORAL_TABLET | Freq: Every day | ORAL | Status: DC

## 2021-08-05 MED ORDER — LIDOCAINE-EPINEPHRINE 1 %-1:100000 IJ SOLN
INTRAMUSCULAR | Status: AC
  Filled 2021-08-05: qty 1

## 2021-08-05 MED ORDER — LIDOCAINE-EPINEPHRINE 1 %-1:100000 IJ SOLN
INTRAMUSCULAR | Status: DC | PRN
  Administered 2021-08-05: 10 mL

## 2021-08-05 MED ORDER — ATORVASTATIN CALCIUM 20 MG PO TABS: 20.0000 mg | ORAL_TABLET | Freq: Every day | ORAL | 0 refills | Status: DC

## 2021-08-05 SURGICAL SUPPLY — 2 items
PACK LOOP INSERTION (CUSTOM PROCEDURE TRAY) ×2 IMPLANT
SYSTEM MONITOR REVEAL LINQ II (Prosthesis & Implant Heart) ×1 IMPLANT

## 2021-08-05 NOTE — Discharge Summary (Addendum)
Physician Discharge Summary  Patient ID: Alyssa Mayo MRN: [id number] DOB/AGE: Jun 30, 1946 76 y.o.  Admit date: 08/01/2021 Discharge date: 08/05/2021  Admission Diagnoses:  Discharge Diagnoses:  Principal Problem:   Acute ischemic stroke [facility])   Discharged Condition: stable  Hospital Course: Alyssa Mayo is a 76 y.o. female with history of diabetes, hypertension, hyperlipidemia, obesity, and sleep apnea presenting to [facility] after being found down by her son. Her last know well was approximately 07/29/2021. She may have been suffering with some upper respiratory symptoms, took some cough medicine and has not been like herself since Tuesday.  She also had not taken her diabetes medications.  She was found to be mildly hyperglycemic with blood sugars in the upper 200s.    Stroke:  Rt MCA infarct with hemorrhagic transformation, embolic pattern likely secondary unclear source, concerning for occult A. fib Code Stroke- infarct versus mass in the right MCA territory. Repeat Head CT- Unchanged right MCA territory infarct with petechial hemorrhage. CTA head & neck- R M2 thrombus MRI- extensive restricted diffusion throughout much of the right MCA territory with associated changes of hemorrhagic transformation and localized cerebral edema and mass-effect.. 2D Echo ejection fraction 60 to 65%. LDL 93 HgbA1c 7.4 Lower extremity Dopplers negative for DVT VTE prophylaxis - SCDs aspirin 81 mg daily only and not dual therapy secondary to hemorrhagic transformation   Cerebral Edema with Mass Effect Hypertonic saline d/c'd overnight Na went from 135 to 145 in 10 hours, hypertonic saline discontinued Infarct moderate size with only focal mass effect and rather mild MLS, not felt 3% saline needed  CT repeat 08/03/2021 : No interval progression of the right MCA hemorrhagic infarct with petechial hemorrhage. Hypertension Home meds:  None Start Amlodipine 10mg  and lisinopril 40 Stable Taper  off Cleviprex successful BP goal <160 Long-term BP goal normotensive   Hyperlipidemia Home meds:  Simvastatin, resumed in hospital LDL 93, goal < 70 High intensity statin not indicated  On Lipitor 20 per pharmacy Continue statin at discharge   Diabetes type II Controlled Home meds:  Farxiga, Glipizide, metformin HgbA1c 7.4, goal < 7.0   Other Stroke Risk Factors Advanced Age >/= 29  Obesity, 36.49, BMI >/= 30 associated with increased stroke risk, recommend weight loss, diet and exercise as appropriate  Family hx stroke (mother, grandmother) Obstructive sleep apnea, on CPAP at home   Other Active Problems Pneumonia Treated for PNA outpatient  CXR- atelectasis Encourage good pulmonary hygiene with incentive spirometer and cough and deep breathing      Follow-up as an outpatient stroke clinic in 2 months Follow-up primary care physician in 1 to 2 weeks   Status post loop recorder placement today. Patient be discharged to short-term rehab facility once bed available.  Patient medically stable for discharge at this time.  Discharge Exam: Blood pressure (!) 123/52, pulse 89, temperature 98 F (36.7 C), temperature source Oral, resp. rate 20, SpO2 93 %. General appearance: alert and cooperative Head: Normocephalic, without obvious abnormality, atraumatic Neck: no adenopathy, no carotid bruit, no JVD, supple, symmetrical, trachea midline, and thyroid not enlarged, symmetric, no tenderness/mass/nodules Resp: clear to auscultation bilaterally Cardio: regular rate and rhythm, S1, S2 normal, no murmur, click, rub or gallop GI: soft, non-tender; bowel sounds normal; no masses,  no organomegaly Extremities: extremities normal, atraumatic, no cyanosis or edema Pulses: 2+ and symmetric Skin: Skin color, texture, turgor normal. No rashes or lesions  Disposition: Discharge disposition: 03-Skilled Nursing Facility       Discharge Instructions  Diet - low sodium heart healthy    Complete by: As directed    Discharge instructions   Complete by: As directed    Follow-up with neurology in 2 to 4 weeks Follow-up primary care physician in 1 week   Increase activity slowly   Complete by: As directed       Allergies as of 08/05/2021       Reactions   Codeine Other (See Comments)   Makes her very loopy and groggy   Neosporin [neomycin-bacitracin Zn-polymyx] Hives   Ozempic [semaglutide] Hives   Polymyxin B Other (See Comments)   Eyes redness   Sulfa Antibiotics Itching        Medication List     STOP taking these medications    amoxicillin-clavulanate 875-125 MG tablet Commonly known as: AUGMENTIN   azithromycin 250 MG tablet Commonly known as: ZITHROMAX   celecoxib 200 MG capsule Commonly known as: CELEBREX   simvastatin 40 MG tablet Commonly known as: ZOCOR       TAKE these medications    acetaminophen 650 MG CR tablet Commonly known as: TYLENOL Take 1,300 mg by mouth daily as needed for pain.   albuterol 108 (90 Base) MCG/ACT inhaler Commonly known as: VENTOLIN HFA Inhale 1-2 puffs into the lungs every 6 (six) hours as needed for wheezing or shortness of breath.   amLODipine 10 MG tablet Commonly known as: NORVASC Take 1 tablet (10 mg total) by mouth daily. Start taking on: August 06, 2021   aspirin EC 81 MG tablet Take 1 tablet (81 mg total) by mouth daily. Take at least one hour BEFORE the Celebrex What changed: when to take this   atorvastatin 20 MG tablet Commonly known as: LIPITOR Take 1 tablet (20 mg total) by mouth daily. Start taking on: August 06, 2021   Cheratussin AC 100-10 MG/5ML syrup Generic drug: guaiFENesin-codeine Take 10 mLs by mouth 3 (three) times daily as needed for cough.   Cholecalciferol 25 MCG (1000 UT) tablet Take 1,000 Units by mouth every morning.   Farxiga 10 MG Tabs tablet Generic drug: dapagliflozin propanediol Take 10 mg by mouth daily. What changed: when to take this   gabapentin  100 MG capsule Commonly known as: NEURONTIN Take 200 mg by mouth at bedtime.   glipiZIDE 5 MG 24 hr tablet Commonly known as: GLUCOTROL XL Take 5 mg by mouth 2 (two) times daily with a meal.   glucose blood test strip Commonly known as: ONE TOUCH ULTRA TEST CHECK BLOOD SUGAR AS DIRECTED.   lisinopril 40 MG tablet Commonly known as: ZESTRIL Take 1 tablet (40 mg total) by mouth daily. Start taking on: August 06, 2021   metFORMIN 500 MG tablet Commonly known as: GLUCOPHAGE Take 2 tablets (1,000 mg total) by mouth 2 (two) times daily. What changed: when to take this   pantoprazole 40 MG tablet Commonly known as: PROTONIX Take 1 tablet (40 mg total) by mouth daily. What changed: when to take this   PreserVision AREDS 2 Caps Take 1 capsule by mouth 2 (two) times daily.         Signed: Delman Goshorn A 08/05/2021, 1:51 PM   Disposition pending rehab bed availability.  Medically stable for discharge once rehab bed available.

## 2021-08-05 NOTE — Progress Notes (Signed)
Inpatient Rehab Admissions Coordinator:  Saw pt at bedside. Pt was lethargic. Informed  her that we are awaiting insurance authorization. Will continue to follow.   Wolfgang Phoenix, MS, CCC-SLP Admissions Coordinator [phone]

## 2021-08-05 NOTE — Progress Notes (Signed)
Pt refusing cpap for the night. ?

## 2021-08-05 NOTE — Consult Note (Addendum)
ELECTROPHYSIOLOGY CONSULT NOTE  Patient ID: Alyssa Mayo MRN: [id number], DOB/AGE: [birth year]   Admit date: 08/01/2021 Date of Consult: 08/05/2021  Primary Physician: [place] Pink, MD Primary Cardiologist: none Reason for Consultation: Cryptogenic stroke; recommendations regarding Implantable Loop Recorder, requested by Dr. Leonie Man   History of Present Illness Alyssa Mayo was admitted on 08/01/2021 unclear presentation, not feeling well for a couple days, found "down" by family, unclear duration and found with stroke, transferred from [facility]    PMHx includes: DM, HTN, HLD, obesity, OSA  Neurology notes:   Rt MCA infarct with hemorrhagic transformation, embolic pattern likely secondary unclear source, concerning for occult A. fib. Infarct moderate size with only focal mass effect and rather mild MLS, not felt 3% saline needed,  CT repeat 08/03/2021 : No interval progression of the right MCA hemorrhagic infarct with petechial hemorrhage.   She has undergone workup for stroke including echocardiogram and carotid dopplers.  The patient has been monitored on telemetry which has demonstrated sinus rhythm with no arrhythmias.   Neurology has deferred TEE  Echocardiogram this admission demonstrated    1. Technically difficult study with poor echo windows. Patient declined  Definity contrast.   2. Left ventricular ejection fraction, by estimation, is 55 to 60%. The  left ventricle has normal function. Left ventricular endocardial border  not optimally defined to evaluate regional wall motion. Left ventricular  diastolic parameters are consistent  with Grade I diastolic dysfunction (impaired relaxation).   3. Right ventricular systolic function is normal. The right ventricular  size is normal. The RV wall appears thickened, however, this may be due to  a prominent fat pad.   4. The mitral valve is normal in structure. Trivial mitral valve  regurgitation.   5. The aortic valve is  tricuspid. There is mild calcification of the  aortic valve. There is mild thickening of the aortic valve. Aortic valve  regurgitation is not visualized. Aortic valve sclerosis/calcification is  present, without any evidence of  aortic stenosis.   6. The inferior vena cava is normal in size with greater than 50%  respiratory variability, suggesting right atrial pressure of 3 mmHg.   Conclusion(s)/Recommendation(s): No intracardiac source of embolism  detected on this transthoracic study. Consider a transesophageal  echocardiogram to exclude cardiac source of embolism if clinically  indicated.   Lab work is reviewed. Last WBC is 13 trending down, afebrile Treated for pneumonia    Prior to admission, the patient denies chest pain, shortness of breath, dizziness, palpitations, or syncope.  They are recovering from their stroke with plans to [place] at discharge.      Past Medical History:  Diagnosis Date   Abnormal levels of other serum enzymes 07/17/2015   Arthritis    Constipation 07/11/2015   COVID-19 03/06/2021   Diabetes mellitus without complication (HCC)    Elevated liver enzymes    Fatty liver    Generalized abdominal pain 07/11/2015   Hyperlipidemia    Hypertension    Lifelong obesity    Macular degeneration    Morbid obesity due to excess calories ([place]) 07/11/2015   Other fatigue 07/11/2015   Sleep apnea    Spinal headache      Surgical History:  Past Surgical History:  Procedure Laterality Date   ABDOMINAL HYSTERECTOMY     APPENDECTOMY     CATARACT EXTRACTION     CERVICAL LAMINECTOMY  07/21/1985   CESAREAN SECTION     COLONOSCOPY  07/21/2012   diverticulosis, [facility] Dr. Donnella Sham  COLONOSCOPY WITH PROPOFOL N/A 02/04/2019   Procedure: COLONOSCOPY WITH PROPOFOL;  Surgeon: Lollie Sails, MD;  Location: [facility] ENDOSCOPY;  Service: Endoscopy;  Laterality: N/A;   COLONOSCOPY WITH PROPOFOL N/A 03/18/2021   Procedure: COLONOSCOPY WITH PROPOFOL;  Surgeon:  Lesly Rubenstein, MD;  Location: [facility] ENDOSCOPY;  Service: Endoscopy;  Laterality: N/A;  DM   DIAGNOSTIC LAPAROSCOPY     ESOPHAGOGASTRODUODENOSCOPY (EGD) WITH PROPOFOL N/A 02/04/2019   Procedure: ESOPHAGOGASTRODUODENOSCOPY (EGD) WITH PROPOFOL;  Surgeon: Lollie Sails, MD;  Location: [facility] ENDOSCOPY;  Service: Endoscopy;  Laterality: N/A;   ESOPHAGOGASTRODUODENOSCOPY (EGD) WITH PROPOFOL N/A 03/18/2021   Procedure: ESOPHAGOGASTRODUODENOSCOPY (EGD) WITH PROPOFOL;  Surgeon: Lesly Rubenstein, MD;  Location: [facility] ENDOSCOPY;  Service: Endoscopy;  Laterality: N/A;   SPINE SURGERY     TUBAL LIGATION       Medications Prior to Admission  Medication Sig Dispense Refill Last Dose   acetaminophen (TYLENOL) 650 MG CR tablet Take 1,300 mg by mouth daily as needed for pain.   07/31/2021   albuterol (VENTOLIN HFA) 108 (90 Base) MCG/ACT inhaler Inhale 1-2 puffs into the lungs every 6 (six) hours as needed for wheezing or shortness of breath. 1 g 0 Past Week   aspirin EC 81 MG tablet Take 1 tablet (81 mg total) by mouth daily. Take at least one hour BEFORE the Celebrex (Patient taking differently: Take 81 mg by mouth every morning. Take at least one hour BEFORE the Celebrex) 30 tablet 11 07/31/2021   celecoxib (CELEBREX) 200 MG capsule Take 1 capsule (200 mg total) by mouth daily. (Patient taking differently: Take 200 mg by mouth See admin instructions. Take one capsule (200 mg) by mouth at noon Monday thru Friday (skip Saturday and Sunday)) 90 capsule 1 07/31/2021   Cholecalciferol 25 MCG (1000 UT) tablet Take 1,000 Units by mouth every morning.   07/31/2021   dapagliflozin propanediol (FARXIGA) 10 MG TABS tablet Take 10 mg by mouth daily. (Patient taking differently: Take 10 mg by mouth every morning.) 90 tablet 1 07/31/2021   gabapentin (NEURONTIN) 100 MG capsule Take 200 mg by mouth at bedtime.   07/31/2021   glipiZIDE (GLUCOTROL XL) 5 MG 24 hr tablet Take 5 mg by mouth 2 (two) times daily with a meal.    07/31/2021   metFORMIN (GLUCOPHAGE) 500 MG tablet Take 2 tablets (1,000 mg total) by mouth 2 (two) times daily. (Patient taking differently: Take 1,000 mg by mouth 2 (two) times daily with a meal.) 360 tablet 1 07/31/2021   Multiple Vitamins-Minerals (PRESERVISION AREDS 2) CAPS Take 1 capsule by mouth 2 (two) times daily.   07/31/2021   pantoprazole (PROTONIX) 40 MG tablet Take 1 tablet (40 mg total) by mouth daily. (Patient taking differently: Take 40 mg by mouth daily with supper.) 90 tablet 1 07/31/2021   simvastatin (ZOCOR) 40 MG tablet Take 1 tablet (40 mg total) by mouth daily. (Patient taking differently: Take 40 mg by mouth at bedtime.) 90 tablet 1 07/31/2021   amoxicillin-clavulanate (AUGMENTIN) 875-125 MG tablet Take 1 tablet by mouth 2 (two) times daily. (Patient not taking: Reported on 08/02/2021)   Not Taking   azithromycin (ZITHROMAX) 250 MG tablet Take 1 tablet (250 mg total) by mouth daily. Take first 2 tablets together, then 1 every day until finished. (Patient not taking: Reported on 08/02/2021) 6 tablet 0 Not Taking   glucose blood (ONE TOUCH ULTRA TEST) test strip CHECK BLOOD SUGAR AS DIRECTED. 100 each 12    guaiFENesin-codeine (CHERATUSSIN AC) 100-10 MG/5ML  syrup Take 10 mLs by mouth 3 (three) times daily as needed for cough. (Patient not taking: Reported on 08/02/2021) 118 mL 0 Not Taking    Inpatient Medications:   amLODipine  10 mg Oral Daily   aspirin EC  81 mg Oral Daily   atorvastatin  20 mg Oral Daily   lisinopril  40 mg Oral Daily   pantoprazole  40 mg Oral Daily    Allergies:  Allergies  Allergen Reactions   Codeine Other (See Comments)    Makes her very loopy and groggy   Neosporin [Neomycin-Bacitracin Zn-Polymyx] Hives   Ozempic [Semaglutide] Hives   Polymyxin B Other (See Comments)    Eyes redness   Sulfa Antibiotics Itching    Social History   Socioeconomic History   Marital status: Widowed    Spouse name: Not on file   Number of children: Not on file    Years of education: Not on file   Highest education level: Bachelor's degree (e.g., BA, AB, BS)  Occupational History   Not on file  Tobacco Use   Smoking status: Never   Smokeless tobacco: Never  Vaping Use   Vaping Use: Never used  Substance and Sexual Activity   Alcohol use: Not Currently    Comment: none last 24 months   Drug use: No   Sexual activity: Not on file  Other Topics Concern   Not on file  Social History Narrative   Not on file   Social Determinants of Health   Financial Resource Strain: Not on file  Food Insecurity: Not on file  Transportation Needs: Not on file  Physical Activity: Not on file  Stress: Not on file  Social Connections: Not on file  Intimate Partner Violence: Not on file     Family History  Problem Relation Age of Onset   Cancer Mother        breast   Heart disease Mother    Stroke Mother    Diabetes Mother    Colon cancer Mother    Cancer Father        leukemia   Stroke Father    Leukemia Father    Ovarian cancer Sister    Diabetes Brother    Cancer Brother    Stroke Maternal Grandfather    Dementia Paternal Grandmother    COPD Neg Hx    Hypertension Neg Hx       Review of Systems: All other systems reviewed and are otherwise negative except as noted above.  Physical Exam: Vitals:   08/04/21 1630 08/04/21 2010 08/04/21 2351 08/05/21 0408  BP: (!) 147/49 (!) 144/61 (!) 143/55 (!) 122/45  Pulse: 80 84 79 93  Resp: 18 20 20 20   Temp: 98.5 F (36.9 C) 98.7 F (37.1 C) 98.7 F (37.1 C) 98 F (36.7 C)  TempSrc:  Oral Oral Oral  SpO2: 94% 96% 91% 92%    GEN- The patient is well appearing, alert and oriented x 3 today.   Head- normocephalic, atraumatic Eyes-  Sclera clear, conjunctiva pink Ears- hearing intact Oropharynx- clear Neck- supple Lungs- CTA b/l, normal work of breathing Heart- RRR, no murmurs, rubs or gallops  GI- soft, NT, ND Extremities- no clubbing, cyanosis, or edema MS- no significant deformity or  atrophy Skin- no rash or lesion Psych- euthymic mood, full affect   Labs:   Lab Results  Component Value Date   WBC 13.9 (H) 08/02/2021   HGB 13.8 08/02/2021   HCT 44.8 08/02/2021  MCV 90.9 08/02/2021   PLT 312 08/02/2021    Recent Labs  Lab 08/01/21 1019 08/01/21 1458 08/02/21 1151  NA 137   < > 141  K 4.2  --  4.3  CL 101  --  111  CO2 24  --  17*  BUN 31*  --  26*  CREATININE 1.17*  --  1.05*  CALCIUM 9.5  --  8.9  PROT 7.9  --   --   BILITOT 0.7  --   --   ALKPHOS 124  --   --   ALT 16  --   --   AST 14*  --   --   GLUCOSE 113*  --  98   < > = values in this interval not displayed.   No results found for: CKTOTAL, CKMB, CKMBINDEX, TROPONINI Lab Results  Component Value Date   CHOL 182 08/02/2021   CHOL 162 11/23/2019   CHOL 178 06/29/2018   Lab Results  Component Value Date   HDL 31 (L) 08/02/2021   HDL 42 11/23/2019   HDL 43 12/17/2016   Lab Results  Component Value Date   LDLCALC 93 08/02/2021   LDLCALC 78 11/23/2019   LDLCALC Comment 12/17/2016   Lab Results  Component Value Date   TRIG 243 (H) 08/03/2021   TRIG 289 (H) 08/02/2021   TRIG 258 (H) 11/23/2019   Lab Results  Component Value Date   CHOLHDL 5.9 08/02/2021   CHOLHDL 4.2 12/17/2016   CHOLHDL 3.5 12/10/2015   No results found for: LDLDIRECT  No results found for: DDIMER   Radiology/Studies:  CT ANGIO HEAD NECK W WO CM Result Date: 08/01/2021 CLINICAL DATA:  Follow-up large right MCA stroke with hemorrhagic conversion EXAM: CT ANGIOGRAPHY HEAD AND NECK TECHNIQUE: Multidetector CT imaging of the head and neck was performed using the standard protocol during bolus administration of intravenous contrast. Multiplanar CT image reconstructions and MIPs were obtained to evaluate the vascular anatomy. Carotid stenosis measurements (when applicable) are obtained utilizing NASCET criteria, using the distal internal carotid diameter as the denominator. RADIATION DOSE REDUCTION: This exam was  performed according to the departmental dose-optimization program which includes automated exposure control, adjustment of the mA and/or kV according to patient size and/or use of iterative reconstruction technique. CONTRAST:  3mL OMNIPAQUE IOHEXOL 350 MG/ML SOLN COMPARISON:  08/01/2021 CT head and MRI head FINDINGS: CT HEAD FINDINGS For noncontrast findings, please see same day CT head. CTA NECK FINDINGS Aortic arch: Two-vessel arch with a common origin of the brachiocephalic and left common carotid arteries. Imaged portion shows no evidence of aneurysm or dissection. No significant stenosis of the major arch vessel origins. Aortic atherosclerosis. Right carotid system: No evidence of dissection, stenosis (50% or greater) or occlusion. Retropharyngeal course of the common and internal carotid arteries. Left carotid system: No evidence of dissection, stenosis (50% or greater) or occlusion. Retropharyngeal course of the common and internal carotid arteries. Vertebral arteries: No evidence of dissection, stenosis (50% or greater) or occlusion. Skeleton: Degenerative changes with straightening and mild reversal of the normal cervical lordosis, with degenerative osseous fusion of C4-C6. No acute osseous abnormality. Other neck: Negative. Upper chest: No focal pulmonary opacity or pleural effusion. Review of the MIP images confirms the above findings CTA HEAD FINDINGS Evaluation is somewhat limited by degree of venous contamination. Anterior circulation: Both internal carotid arteries are patent to the termini, without stenosis or other abnormality. A1 segments patent. Normal anterior communicating artery. Anterior cerebral arteries are  patent to their distal aspects. No M1 stenosis or occlusion. At the right MCA bifurcation, there is a nonocclusive filling defect (series 9, image 92-93), with irregular and diminished flow in the inferior division and its branches. Patent superior division. Normal left MCA bifurcation.  Distal left MCA branches perfused. Posterior circulation: Vertebral arteries patent to the vertebrobasilar junction without stenosis. Basilar patent to its distal aspect. Small fenestration proximally. Superior cerebellar arteries patent bilaterally. Bilateral P1 segments patent. Bilateral posterior communicating arteries patent. PCAs perfused to their distal aspects without stenosis. Venous sinuses: As permitted by contrast timing, patent. Anatomic variants: None significant Review of the MIP images confirms the above findings IMPRESSION: 1. Nonocclusive filling defect in the right MCA bifurcation, with irregular and diminished flow in the inferior M2. 2. No other significant stenosis or large vessel occlusion. 3.  No hemodynamically significant stenosis in the neck. Electronically Signed   By: Merilyn Baba M.D.   On: 08/01/2021 19:40   DG Chest 2 View Result Date: 07/23/2021 CLINICAL DATA:  Short of breath, cough for 5-6 days EXAM: CHEST - 2 VIEW COMPARISON:  None. FINDINGS: Frontal and lateral views of the chest demonstrate an enlarged cardiac silhouette. There is patchy consolidation within the right infrahilar region concerning for pneumonia. No effusion or pneumothorax. No acute bony abnormalities. IMPRESSION: 1. Right infrahilar airspace disease, likely in the right middle lobe, compatible with pneumonia. 2. Enlarged cardiac silhouette. Electronically Signed   By: Randa Ngo M.D.   On: 07/23/2021 17:47   CT HEAD WO CONTRAST (5MM) Result Date: 08/03/2021 CLINICAL DATA:  Stroke follow-up EXAM: CT HEAD WITHOUT CONTRAST TECHNIQUE: Contiguous axial images were obtained from the base of the skull through the vertex without intravenous contrast. RADIATION DOSE REDUCTION: This exam was performed according to the departmental dose-optimization program which includes automated exposure control, adjustment of the mA and/or kV according to patient size and/or use of iterative reconstruction technique.  COMPARISON:  Yesterday FINDINGS: Brain: Right MCA territory infarction which is most confluent at the insula and operculum with petechial hemorrhage. No evidence of progression. No worrisome mass effect, collection, or hydrocephalus. Vascular: No hyperdense vessel or unexpected calcification. Skull: Normal. Negative for fracture or focal lesion. Sinuses/Orbits: Bilateral cataract resection IMPRESSION: No interval progression of the recent right MCA infarct with petechial hemorrhage. Electronically Signed   By: Jorje Guild M.D.   On: 08/03/2021 04:10   CT HEAD WO CONTRAST Result Date: 08/02/2021 CLINICAL DATA:  Stroke follow-up EXAM: CT HEAD WITHOUT CONTRAST TECHNIQUE: Contiguous axial images were obtained from the base of the skull through the vertex without intravenous contrast. RADIATION DOSE REDUCTION: This exam was performed according to the departmental dose-optimization program which includes automated exposure control, adjustment of the mA and/or kV according to patient size and/or use of iterative reconstruction technique. COMPARISON:  Brain MRI from yesterday FINDINGS: Brain: Cytotoxic edema from recent infarct in the right MCA territory, unchanged in extent when compared to prior brain MRI, specifically involving the lateral frontal lobe, insula, and patchy along the high frontal parietal convexity. Basal ganglia involvement at the putamen and caudate. Petechial hemorrhage centered on the frontal operculum and insula. Local mass effect is unchanged. No progressive finding or hydrocephalus. Vascular: No hyperdense vessel or unexpected calcification. Skull: Normal. Negative for fracture or focal lesion. Sinuses/Orbits: No acute finding. IMPRESSION: Unchanged right MCA territory infarct with petechial hemorrhage. Electronically Signed   By: Jorje Guild M.D.   On: 08/02/2021 04:22   CT HEAD WO CONTRAST Result Date: 08/01/2021 CLINICAL  DATA:  Neuro deficit, stroke suspected, left-sided weakness  and slurred speech EXAM: CT HEAD WITHOUT CONTRAST TECHNIQUE: Contiguous axial images were obtained from the base of the skull through the vertex without intravenous contrast. RADIATION DOSE REDUCTION: This exam was performed according to the departmental dose-optimization program which includes automated exposure control, adjustment of the mA and/or kV according to patient size and/or use of iterative reconstruction technique. COMPARISON:  06/26/2014 head CT, correlation is also made with 05/21/2019 MRI head FINDINGS: Brain: Mass or masses in the inferior right frontal lobe, measuring approximately 3.7 x 1.7 x 3.3 cm (series 3, image 13 and series 5, image 27), with surrounding hypodensity, most likely edema, which extends into the right basal ganglia and causes sulcal crowding in the right frontal lobe and mild narrowing of the right lateral ventricle. Approximately 3 mm of right-to-left midline shift at the level of the basal ganglia. Additional hypodensity more superiorly in the anterior right frontal lobe (series 3, image 22) and in the right frontoparietal region (series 3, image 22), new from the prior exam may be related to the mass or represent age-indeterminate infarcts. No acute hemorrhage.  No hydrocephalus or extra-axial collection. Vascular: No hyperdense vessel. Skull: No acute osseous abnormality. Sinuses/Orbits: Mucosal thickening in the ethmoid air cells. Status post right lens replacement. Other: Trace fluid in the bilateral mastoid air cells. IMPRESSION: 1. Mass in the inferior right frontal lobe, with surrounding edema, with local mass effect in the right frontal lobe and on the right lateral ventricle, with approximately 3 mm of right-to-left midline shift. An MRI with and without contrast is recommended for further evaluation. 2. Additional hypodensity in the anterior right frontal lobe and right frontal parietal region are new from the prior exam and are technically age indeterminate but may  indicate subacute infarcts. These results were called by telephone at the time of interpretation on 08/01/2021 at 11:20 am to provider Willy Eddy , who verbally acknowledged these results. Electronically Signed   By: Wiliam Ke M.D.   On: 08/01/2021 11:20   MR Brain W and Wo Contrast Result Date: 08/01/2021 CLINICAL DATA:  Left-sided weakness, slurred speech EXAM: MRI HEAD WITHOUT AND WITH CONTRAST TECHNIQUE: Multiplanar, multiecho pulse sequences of the brain and surrounding structures were obtained without and with intravenous contrast. CONTRAST:  9mL GADAVIST GADOBUTROL 1 MMOL/ML IV SOLN COMPARISON:  Same-day noncontrast head CT FINDINGS: Brain: There is extensive diffusion restriction throughout much of the MCA territory including the insula, anterior frontal lobe, lentiform nucleus, caudate body, extending into the high right frontal and parietal lobe cortices. There is associated FLAIR signal abnormality and edema consistent with subacute infarcts. There is extensive SWI signal dropout consistent with hemorrhagic transformation. There is no enhancement to suggest solid underlying mass lesion. The above findings result in effacement of the sulci particularly in the region of the right frontal operculum and partial effacement of the right lateral ventricle. There is no midline shift. The remainder of the parenchyma is normal in appearance. There is no abnormal enhancement. Vascular: Normal flow voids. Skull and upper cervical spine: Normal marrow signal. Sinuses/Orbits: There is mild mucosal thickening in the paranasal sinuses. Bilateral lens implants are in place. The globes and orbits are otherwise unremarkable. Other: There are small bilateral mastoid effusions. IMPRESSION: 1. Findings above consistent with early subacute infarcts in the right MCA distribution with involvement of the insula, lentiform and caudate nuclei, frontal, and parietal lobes with evidence of hemorrhagic transformation.  Associated edema results in mild regional mass  effect without midline shift. 2. No abnormal enhancement to suggest underlying mass lesion. Critical Value/emergent results were called by telephone at the time of interpretation on 08/01/2021 at 2:00 pm to provider Dr Tamala Julian, who verbally acknowledged these results. Electronically Signed   By: Valetta Mole M.D.   On: 08/01/2021 14:04      VAS [place] LOWER EXTREMITY VENOUS (DVT) Result Date: 08/02/2021  Lower Venous DVT Study Patient Name:  Alyssa Mayo  Date of Exam:   08/02/2021 Medical Rec #: [id number]           Accession #:    [id number] Date of Birth:                     Patient Gender: Patient Age: Exam Location:  [facility] Procedure:      VAS [place] LOWER EXTREMITY VENOUS (DVT) Referring Phys: --------------------------------------------------------------------------------  Indications: Stroke.  Comparison Study: no prior Performing Technologist: Archie Patten RVS  Examination Guidelines: A complete evaluation includes B-mode imaging, spectral Doppler, color Doppler, and power Doppler as needed of all accessible portions of each vessel. Bilateral testing is considered an integral part of a complete examination. Limited examinations for reoccurring indications may be performed as noted. The reflux portion of the exam is performed with the patient in reverse Trendelenburg.   Summary: BILATERAL: - No evidence of deep vein thrombosis seen in the lower extremities, bilaterally. -No evidence of popliteal cyst, bilaterally.   *See table(s) above for measurements and observations. Electronically signed by Jamelle Haring on 08/02/2021 at 2:55:16 PM.    Final     12-lead ECG SR All prior EKG's in EPIC reviewed with no documented atrial fibrillation  Telemetry SR  Assessment and Plan:  1. Cryptogenic stroke The patient presents with cryptogenic stroke.    I spoke at length with the patient about monitoring for afib with either a 30 day event monitor or  an implantable loop recorder.  Risks, benefits, and alteratives to implantable loop recorder were discussed with the patient today.   At this time, the patient is very clear in their decision to proceed with implantable loop recorder.   With hemorrhagic transformation, if AFib is found early, will need neurology input on [facility]   Wound care was reviewed with the patient (keep incision clean and dry for 3 days).  Wound check follow up will be scheduled for the patient.  Please call with questions.   Renee Dyane Dustman, PA-C 08/05/2021  EP Attending  Patient seen and examined. Agree with the findings as noted above. The patient has had a cryptogenic stroke and is awaiting CIR. There is concern an embolic event. I have discussed the indications/risks/benefits/goals/expectations of ILR insertion and she wishes to proceed.  Carleene Overlie Zackeriah Kissler,MD

## 2021-08-05 NOTE — Progress Notes (Signed)
Physical Therapy Treatment Patient Details Name: Alyssa Mayo MRN: [id number] DOB: 10-10-1945 Today's Date: 08/05/2021   History of Present Illness 76 y/o female presented to ED on 08/01/21 after being found down with L sided weakness and slurred speech. 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 CTA showed nonocclusive filling defect in R MCA bifurcation and irregular and diminished flow in inferior division of R M2. PMH: HTN, obesity, macular degeneration, diabetes, sleep apnea    PT Comments    Pt lethargic this session however was able to rouse enough to participate with PT session. Her focus was to walk to the bathroom to void, and perform ADL's at the sink. Pt insisting on using the RW for support however did better with HHA as she cannot keep the L hand on the walker without assistance. Pt with decreased attention to the L side however was attempting to utilize the L hand to squeeze toothpaste on her toothbrush. Overall, balance was fair during static standing, and was able to initiate some weight shifting without LOB while brushing teeth and washing hands. Continue to feel this patient will benefit from AIR level rehab at d/c to maximize functional independence and safety prior to return home with family support. Will continue to follow and progress as able per POC.    Recommendations for follow up therapy are one component of a multi-disciplinary discharge planning process, led by the attending physician.  Recommendations may be updated based on patient status, additional functional criteria and insurance authorization.  Follow Up Recommendations  Acute inpatient rehab (3hours/day)     Assistance Recommended at Discharge Frequent or constant Supervision/Assistance  Patient can return home with the following A little help with walking and/or transfers;A little help with bathing/dressing/bathroom;Help with stairs or ramp for  entrance;Direct supervision/assist for financial management;Direct supervision/assist for medications management;Assist for transportation   Equipment Recommendations  None recommended by PT    Recommendations for Other Services       Precautions / Restrictions Precautions Precautions: Fall Precaution Comments: SBP<160 Restrictions Weight Bearing Restrictions: No     Mobility  Bed Mobility Overal bed mobility: Needs Assistance Bed Mobility: Supine to Sit;Sit to Supine     Supine to sit: Mod assist Sit to supine: Min guard   General bed mobility comments: HOB flat and heavy use of rails. Pt reaching for therapist's hand to elevate trunk to full sitting position. Several rounds of VC's for pt to scoot around and get both feet on the floor.    Transfers Overall transfer level: Needs assistance Equipment used: Rolling walker (2 wheels);None Transfers: Sit to/from Stand Sit to Stand: Min assist     Step pivot transfers: Min assist     General transfer comment: Assist to power up to full stand, to gain/maintain standing balance, and to place L hand onto the walker (VC's to attend to the L side).    Ambulation/Gait Ambulation/Gait assistance: Min assist Gait Distance (Feet): 25 Feet Assistive device: 1 person hand held assist;Rolling walker (2 wheels) Gait Pattern/deviations: Step-through pattern;Decreased stride length Gait velocity: decreased     General Gait Details: Pt insisting on using the RW for support however with difficulty keeping the L hand on the walker and pt with increased difficulty attending to the L side. Gait was improved with HHA, however pt continued to have difficulty attending to the L.   Stairs             Psychologist, prison and probation services  Modified Rankin (Stroke Patients Only) Modified Rankin (Stroke Patients Only) Pre-Morbid Rankin Score: Slight disability Modified Rankin: Moderately severe disability     Balance Overall balance  assessment: Needs assistance Sitting-balance support: Feet supported Sitting balance-Leahy Scale: Fair     Standing balance support: Single extremity supported;During functional activity Standing balance-Leahy Scale: Fair Standing balance comment: stood at the sink to groom                            Cognition Arousal/Alertness: Lethargic Behavior During Therapy: Flat affect Overall Cognitive Status: Impaired/Different from baseline Area of Impairment: Attention;Following commands;Safety/judgement;Awareness;Problem solving                 Orientation Level: Disoriented to;Time Current Attention Level: Sustained   Following Commands: Follows one step commands inconsistently Safety/Judgement: Decreased awareness of safety;Decreased awareness of deficits Awareness: Emergent Problem Solving: Slow processing;Difficulty sequencing;Requires verbal cues General Comments: Lethargic and required mod-max cues for all sequencing and problem solving and had difficulty following commands. Pt was not attending to the L side well, however seems slightly improved from OT session earlier today.        Exercises      General Comments        Pertinent Vitals/Pain Pain Assessment: No/denies pain Faces Pain Scale: No hurt    Home Living                          Prior Function            PT Goals (current goals can now be found in the care plan section) Acute Rehab PT Goals Patient Stated Goal: to get better PT Goal Formulation: With patient Time For Goal Achievement: 08/16/21 Potential to Achieve Goals: Good Progress towards PT goals: Progressing toward goals    Frequency    Min 4X/week      PT Plan Current plan remains appropriate    Co-evaluation              AM-PAC PT "6 Clicks" Mobility   Outcome Measure  Help needed turning from your back to your side while in a flat bed without using bedrails?: A Little Help needed moving from  lying on your back to sitting on the side of a flat bed without using bedrails?: A Little Help needed moving to and from a bed to a chair (including a wheelchair)?: A Little Help needed standing up from a chair using your arms (e.g., wheelchair or bedside chair)?: A Little Help needed to walk in hospital room?: A Lot Help needed climbing 3-5 steps with a railing? : A Lot 6 Click Score: 16    End of Session Equipment Utilized During Treatment: Gait belt Activity Tolerance: Patient tolerated treatment well Patient left: in bed;with bed alarm set;with call bell/phone within reach Nurse Communication: Mobility status PT Visit Diagnosis: Unsteadiness on feet (R26.81);Muscle weakness (generalized) (M62.81)     Time: 6767-2094 PT Time Calculation (min) (ACUTE ONLY): 20 min  Charges:  $Gait Training: 8-22 mins                     Conni Slipper, PT, DPT Acute Rehabilitation Services Pager: [phone] Office: [phone]    Marylynn Pearson 08/05/2021, 3:32 PM

## 2021-08-05 NOTE — Progress Notes (Signed)
STROKE TEAM PROGRESS NOTE   INTERVAL HISTORY Her family member is at the bedside.  She is lying in bed comfortably and is awake and interactive.  Blood pressure adequately controlled.  Neurological exam is unchanged.  Vital signs are stable.  She has been evaluated by therapy teams who recommended inpatient rehab.  Patient is willing to undergo loop recorder insertion prior to discharge.  Vitals:   08/04/21 2351 08/05/21 0408 08/05/21 0902 08/05/21 1210  BP: (!) 143/55 (!) 122/45 137/64 (!) 123/52  Pulse: 79 93  89  Resp: 20 20  20   Temp: 98.7 F (37.1 C) 98 F (36.7 C)    TempSrc: Oral Oral    SpO2: 91% 92%  93%   CBC:  Recent Labs  Lab 08/01/21 1019 08/02/21 1151  WBC 14.0* 13.9*  NEUTROABS 11.0*  --   HGB 14.1 13.8  HCT 44.2 44.8  MCV 89.1 90.9  PLT 308 312   Basic Metabolic Panel:  Recent Labs  Lab 08/01/21 1019 08/01/21 1458 08/01/21 2315 08/02/21 1151  NA 137   < > 145 141  K 4.2  --   --  4.3  CL 101  --   --  111  CO2 24  --   --  17*  GLUCOSE 113*  --   --  98  BUN 31*  --   --  26*  CREATININE 1.17*  --   --  1.05*  CALCIUM 9.5  --   --  8.9   < > = values in this interval not displayed.   Lipid Panel:  Recent Labs  Lab 08/02/21 0341 08/03/21 0542  CHOL 182  --   TRIG 289* 243*  HDL 31*  --   CHOLHDL 5.9  --   VLDL 58*  --   LDLCALC 93  --    HgbA1c:  Recent Labs  Lab 08/02/21 0341  HGBA1C 7.4*   Urine Drug Screen:  Recent Labs  Lab 08/01/21 1821  LABOPIA NONE DETECTED  COCAINSCRNUR NONE DETECTED  LABBENZ NONE DETECTED  AMPHETMU NONE DETECTED  THCU NONE DETECTED  LABBARB NONE DETECTED    Alcohol Level  Recent Labs  Lab 08/01/21 1019  ETH <10    IMAGING past 24 hours No results found.  PHYSICAL EXAM  Physical Exam  Constitutional: Appears well-developed and well-nourished.  Pleasant elderly Caucasian lady Psych: Affect appropriate to situation Eyes: No scleral injection HENT: No OP obstrucion MSK: no joint  deformities.  Cardiovascular: Normal rate and regular rhythm.  Respiratory: Effort normal, non-labored breathing GI: Soft.  No distension. There is no tenderness.  Skin: WDI  Neuro: Mental Status: Patient is awake and interactive , oriented to person, place, month, year, and situation. Patient is able to give a clear and coherent history. Slight dysarthria. Cranial Nerves: II: Visual Fields are full. Pupils are equal, round, and reactive to light.   III,IV, VI: EOMI without ptosis or diploplia.  V: Facial sensation is symmetric to temperature VII: Left lower facial droop VIII: Hearing is intact to voice X: Palate elevates symmetrically XI: Shoulder shrug is symmetric. XII: Tongue protrudes midline without atrophy or fasciculations.  Motor: Tone is normal. Bulk is normal. 5/5 strength was present in RLE, LLE, RLE.  Left UE drift present, fine motor control impaired, hand grasp weak Sensory: Sensation is symmetric to light touch and temperature in the arms and legs. No extinction to DSS present.  Coordination: FNF and HKS are intact bilaterally  ASSESSMENT/PLAN [name]  Mayo is a 76 y.o. female with history of diabetes, hypertension, hyperlipidemia, obesity, and sleep apnea presenting to [facility] after being found down by her son. Her last know well was approximately 07/29/2021. She may have been suffering with some upper respiratory symptoms, took some cough medicine and has not been like herself since Tuesday.  She also had not taken her diabetes medications.  She was found to be mildly hyperglycemic with blood sugars in the upper 200s.   Stroke:  Rt MCA infarct with hemorrhagic transformation, embolic pattern likely secondary unclear source, concerning for occult A. fib Code Stroke- infarct versus mass in the right MCA territory. Repeat Head CT- Unchanged right MCA territory infarct with petechial hemorrhage. CTA head & neck- R M2 thrombus MRI- extensive restricted diffusion  throughout much of the right MCA territory with associated changes of hemorrhagic transformation and localized cerebral edema and mass-effect.. 2D Echo ejection fraction 60 to 65%. LDL 93 HgbA1c 7.4 Lower extremity Dopplers negative for DVT VTE prophylaxis - SCDs aspirin 81 mg daily prior to admission, now on No antithrombotic. Given hemorrhagic transformation. Therapy recommendations:  Pending Disposition:  Pending  Cerebral Edema with Mass Effect Hypertonic saline d/c'd overnight Na went from 135 to 145 in 10 hours Switch to NS 47ml/hr Infarct moderate size with only focal mass effect and rather mild MLS, not felt 3% saline needed  CT repeat 08/03/2021 : No interval progression of the right MCA hemorrhagic infarct with petechial hemorrhage. Hypertension Home meds:  None Start Amlodipine 10mg  and lisinopril 40 Stable Taper off Cleviprex as able BP goal <160 Long-term BP goal normotensive  Hyperlipidemia Home meds:  Simvastatin, resumed in hospital LDL 93, goal < 70 High intensity statin not indicated  On Lipitor 20 per pharmacy Continue statin at discharge  Diabetes type II Controlled Home meds:  Farxiga, Glipizide, metformin HgbA1c 7.4, goal < 7.0 CBGs SSI  Other Stroke Risk Factors Advanced Age >/= 4  Obesity, 36.49, BMI >/= 30 associated with increased stroke risk, recommend weight loss, diet and exercise as appropriate  Family hx stroke (mother, grandmother) Obstructive sleep apnea, on CPAP at home  Other Active Problems Pneumonia Treated for PNA outpatient  CXR- atelectasis Encourage good pulmonary hygiene with incentive spirometer and cough and deep breathing   Hospital day # 4 Continue mobilize out of bed.  Continue ongoing therapies.   Transfer to inpatient rehab when bed available after insurance approval.  Continue telemetry monitoring and she will need loop recorder at discharge to look for paroxysmal A. fib.  Continue aspirin 81 mg daily and we will not  do dual antiplatelet therapy due to hemorrhagic transformation.  Long discussion with patient and family at the bedside and answered questions.  Stroke team will sign off.  Only call for questions.  Follow-up as an outpatient stroke clinic in 2 months Greater than 50% time during this 25-minute visit was spent in counseling and coordination of care of hemorrhagic stroke discussion about stroke evaluation, prevention and treatment  75, MD Medical Director [facility] Pager: [phone] 08/05/2021 12:52 PM      To contact Stroke Continuity provider, please refer to 08/07/2021. After hours, contact General Neurology

## 2021-08-05 NOTE — Care Management Important Message (Signed)
Important Message  Patient Details  Name: Alyssa Mayo MRN: [id number] Date of Birth: 1945-09-01   Medicare Important Message Given:  Yes     Mardene Sayer 08/05/2021, 3:05 PM

## 2021-08-05 NOTE — Discharge Instructions (Signed)
Post procedure wound care instructions °Keep incision clean and dry for 3 days. °You can remove outer PLASTIC dressing tomorrow. °Leave steri-strips (little pieces of tape) on until seen in the office for wound check appointment. °Call the office ([phone]) for redness, drainage, swelling, or fever. ° °

## 2021-08-05 NOTE — Progress Notes (Signed)
Occupational Therapy Treatment Patient Details Name: Alyssa Mayo MRN: [id number] DOB: August 04, 1945 Today's Date: 08/05/2021   History of present illness 76 y/o female presented to ED on 08/01/21 after being found down with L sided weakness and slurred speech. 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 CTA showed nonocclusive filling defect in R MCA bifurcation and irregular and diminished flow in inferior division of R M2. PMH: HTN, obesity, macular degeneration, diabetes, sleep apnea   OT comments  Alyssa Mayo is progressing incrementally, she was resting upon arrival, difficult to rouse and remained lethargic throughout the session. She was able to stand with min A and required min-mod A to navigate in the room with RW. Pt was having difficulty with attending to her R environment, managing her LUE, sequencing and problem solving. Pt groomed at the sink and required assist to manage her LUE and place in safe/supportive positioning as well as cues for sequencing. Pt also completed toilet transfer and hygiene and min-mod assist due to difficulty with above impairments. D/c recommendation updated to CIR for intensive therapies to progress pt functionally and cognitively prior to d/c home. OT to continue to follow acutely.     Recommendations for follow up therapy are one component of a multi-disciplinary discharge planning process, led by the attending physician.  Recommendations may be updated based on patient status, additional functional criteria and insurance authorization.    Follow Up Recommendations  Acute inpatient rehab (3hours/day)    Assistance Recommended at Discharge Intermittent Supervision/Assistance  Patient can return home with the following  A little help with walking and/or transfers;A little help with bathing/dressing/bathroom;Direct supervision/assist for medications management;Assist for transportation;Help with stairs or  ramp for entrance;Assistance with cooking/housework   Equipment Recommendations  Tub/shower seat    Recommendations for Other Services Rehab consult    Precautions / Restrictions Precautions Precautions: Fall Restrictions Weight Bearing Restrictions: No       Mobility Bed Mobility Overal bed mobility: Needs Assistance Bed Mobility: Supine to Sit;Sit to Supine     Supine to sit: Min assist Sit to supine: Min guard   General bed mobility comments: for trunk elevation    Transfers Overall transfer level: Needs assistance Equipment used: Rolling walker (2 wheels) Transfers: Sit to/from Stand Sit to Stand: Min assist           General transfer comment: verbal cues for hand placement, and to steady once standing     Balance Overall balance assessment: Needs assistance Sitting-balance support: Feet supported Sitting balance-Leahy Scale: Fair     Standing balance support: Single extremity supported;During functional activity Standing balance-Leahy Scale: Fair Standing balance comment: stood at the sink to groom                           ADL either performed or assessed with clinical judgement   ADL Overall ADL's : Needs assistance/impaired     Grooming: Oral care;Brushing hair;Minimal assistance;Standing Grooming Details (indicate cue type and reason): min A for cues, packaging and management of LUE                 Toilet Transfer: Minimal assistance;Regular Toilet;Rolling walker (2 wheels);Cueing for safety;Cueing for sequencing Toilet Transfer Details (indicate cue type and reason): Min A for managment of RW and cues Toileting- Clothing Manipulation and Hygiene: Min guard;Sitting/lateral lean Toileting - Clothing Manipulation Details (indicate cue type and reason): required cue to located toilet paper to pt's  L side     Functional mobility during ADLs: Minimal assistance;Rolling walker (2 wheels) General ADL Comments: Pt limited by  lethargic, cognition and inattention this session. Pt ran into all barriers in her R side adn required max cues to correct for safety. Cue f orproblem solving to complete ADLs wihtout use of LUE. & assist for LUE management and safe/supoprtive positioning    Extremity/Trunk Assessment Upper Extremity Assessment Upper Extremity Assessment: LUE deficits/detail LUE Deficits / Details: [facility] is [facility] for hand wrist and elbow, limited shoulder ROM. Pt denies numbness/tingingly. She does not attend well to LUE. She was able to sustain grip on RW, unable to use functionally for grooming at the sink. Globally 3-/5 MMT. Pt leaving L hand and wrist in unsafe positions LUE Sensation: decreased proprioception LUE Coordination: decreased fine motor;decreased gross motor   Lower Extremity Assessment Lower Extremity Assessment: Defer to PT evaluation        Vision   Vision Assessment?: Vision impaired- to be further tested in functional context Additional Comments: Pt not attending well to LUE, however see running into environmental barriers on the R side. Attempted visual assessment, pt not following commands   Perception Perception Perception: Not tested   Praxis Praxis Praxis: Not tested    Cognition Arousal/Alertness: Lethargic Behavior During Therapy: Flat affect Overall Cognitive Status: Impaired/Different from baseline Area of Impairment: Attention;Following commands;Safety/judgement;Awareness;Problem solving                   Current Attention Level: Sustained   Following Commands: Follows one step commands inconsistently Safety/Judgement: Decreased awareness of safety;Decreased awareness of deficits Awareness: Emergent Problem Solving: Slow processing;Difficulty sequencing;Requires verbal cues General Comments: Pt was extremely lethargic this session. She required mod-max cues for all sequencing and problem solving and had difficulty following commands. Pt was not attending to the  L side well, running into any/all environmental barriers in the room. Visioin was difficult to assess due to poor command following                General Comments VSS on RA, no family present    Pertinent Vitals/ Pain       Pain Assessment: No/denies pain   Frequency  Min 2X/week        Progress Toward Goals  OT Goals(current goals can now be found in the care plan section)  Progress towards OT goals: Progressing toward goals  Acute Rehab OT Goals Patient Stated Goal: go home and sleep in her bed OT Goal Formulation: With patient Time For Goal Achievement: 08/16/21 Potential to Achieve Goals: Good ADL Goals Pt Will Perform Grooming: with supervision;standing Pt Will Perform Lower Body Bathing: with supervision;sit to/from stand Pt Will Perform Lower Body Dressing: with supervision;sit to/from stand Pt Will Transfer to Toilet: with supervision;ambulating;regular height toilet Pt Will Perform Toileting - Clothing Manipulation and hygiene: with supervision;sit to/from stand  Plan Discharge plan needs to be updated       AM-PAC OT "6 Clicks" Daily Activity     Outcome Measure   Help from another person eating meals?: A Little Help from another person taking care of personal grooming?: A Little Help from another person toileting, which includes using toliet, bedpan, or urinal?: A Little Help from another person bathing (including washing, rinsing, drying)?: A Lot Help from another person to put on and taking off regular upper body clothing?: A Little Help from another person to put on and taking off regular lower body clothing?: A Lot 6 Click Score: 16  End of Session Equipment Utilized During Treatment: Gait belt;Rolling walker (2 wheels)  OT Visit Diagnosis: Unsteadiness on feet (R26.81);Muscle weakness (generalized) (M62.81);Ataxia, unspecified (R27.0);Hemiplegia and hemiparesis Hemiplegia - Right/Left: Left Hemiplegia - dominant/non-dominant:  Non-Dominant Hemiplegia - caused by: Cerebral infarction   Activity Tolerance Patient tolerated treatment well   Patient Left in bed;with bed alarm set;with call bell/phone within reach   Nurse Communication Mobility status        Time: 4034-7425 OT Time Calculation (min): 20 min  Charges: OT General Charges $OT Visit: 1 Visit OT Treatments $Self Care/Home Management : 8-22 mins   Xana Bradt A Ivie Savitt 08/05/2021, 10:53 AM

## 2021-08-06 ENCOUNTER — Encounter (HOSPITAL_COMMUNITY): Payer: Self-pay | Admitting: Internal Medicine

## 2021-08-06 LAB — GLUCOSE, CAPILLARY
Glucose-Capillary: 136 mg/dL — ABNORMAL HIGH (ref 70–99)
Glucose-Capillary: 133 mg/dL — ABNORMAL HIGH (ref 70–99)
Glucose-Capillary: 134 mg/dL — ABNORMAL HIGH (ref 70–99)

## 2021-08-06 MED ORDER — ONDANSETRON HCL 4 MG/2ML IJ SOLN: 4.0000 mg | Freq: Four times a day (QID) | INTRAMUSCULAR | Status: DC | PRN

## 2021-08-06 MED ORDER — ACETAMINOPHEN 325 MG PO TABS: 325.0000 mg | ORAL_TABLET | ORAL | Status: DC | PRN

## 2021-08-06 NOTE — Progress Notes (Signed)
Physical Therapy Treatment Patient Details Name: Alyssa Mayo MRN: [id number] DOB: April 08, 1946 Today's Date: 08/06/2021   History of Present Illness 76 y/o female presented to ED on 08/01/21 after being found down with L sided weakness and slurred speech. 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 CTA showed nonocclusive filling defect in R MCA bifurcation and irregular and diminished flow in inferior division of R M2. PMH: HTN, obesity, macular degeneration, diabetes, sleep apnea    PT Comments    Pt resting upon arrival to room, agreeable to gait training with use of SPC which is pt's preferred AD. Pt requires MAX multimodal cuing to attend to L today, PT corrected pt from bumping into objects x7 during hallway ambulation and pt with no carryover of scanning to L. Pt's son present in room for session, both pt and family eager for d/c to [facility]. Will continue to follow.      Recommendations for follow up therapy are one component of a multi-disciplinary discharge planning process, led by the attending physician.  Recommendations may be updated based on patient status, additional functional criteria and insurance authorization.  Follow Up Recommendations  Acute inpatient rehab (3hours/day)     Assistance Recommended at Discharge Frequent or constant Supervision/Assistance  Patient can return home with the following A little help with walking and/or transfers;A little help with bathing/dressing/bathroom;Help with stairs or ramp for entrance;Direct supervision/assist for financial management;Direct supervision/assist for medications management;Assist for transportation   Equipment Recommendations  None recommended by PT    Recommendations for Other Services       Precautions / Restrictions Precautions Precautions: Fall Precaution Comments: SBP<160 Restrictions Weight Bearing Restrictions: No     Mobility  Bed  Mobility Overal bed mobility: Needs Assistance Bed Mobility: Supine to Sit, Sit to Supine     Supine to sit: Mod assist Sit to supine: Min assist   General bed mobility comments: min-mod assist for trunk elevation, LE management, scooting to/from EOB.    Transfers Overall transfer level: Needs assistance Equipment used: Straight cane Transfers: Sit to/from Stand Sit to Stand: Min assist           General transfer comment: assist for power up and steadying, cues for hand placement when rising/sitting.    Ambulation/Gait Ambulation/Gait assistance: Min assist, Mod assist Gait Distance (Feet): 100 Feet Assistive device: Straight cane Gait Pattern/deviations: Step-through pattern, Decreased stride length, Staggering left Gait velocity: decr     General Gait Details: min assist to steady throughout and correct trajectory to L, occasional mod assist for physically redirecting her from running into objects on L   Stairs             Wheelchair Mobility    Modified Rankin (Stroke Patients Only) Modified Rankin (Stroke Patients Only) Pre-Morbid Rankin Score: Slight disability Modified Rankin: Moderately severe disability     Balance Overall balance assessment: Needs assistance Sitting-balance support: Feet supported Sitting balance-Leahy Scale: Fair     Standing balance support: Single extremity supported, During functional activity Standing balance-Leahy Scale: Fair                              Cognition Arousal/Alertness: Awake/alert Behavior During Therapy: Flat affect Overall Cognitive Status: Impaired/Different from baseline Area of Impairment: Attention, Following commands, Safety/judgement, Awareness, Problem solving                   Current Attention  Level: Sustained   Following Commands: Follows one step commands inconsistently Safety/Judgement: Decreased awareness of safety, Decreased awareness of deficits Awareness:  Emergent Problem Solving: Slow processing, Difficulty sequencing, Requires verbal cues General Comments: very inattentive to L, max multimodal cues throughout session to address. Follows one-step commands well, requires increased time        Exercises      General Comments        Pertinent Vitals/Pain Pain Assessment Pain Assessment: No/denies pain Faces Pain Scale: Hurts a little bit Pain Location: legs (neuropathy) Pain Descriptors / Indicators: Burning Pain Intervention(s): Limited activity within patient's tolerance, Monitored during session, Repositioned    Home Living                          Prior Function            PT Goals (current goals can now be found in the care plan section) Acute Rehab PT Goals Patient Stated Goal: to get better PT Goal Formulation: With patient Time For Goal Achievement: 08/16/21 Potential to Achieve Goals: Good Progress towards PT goals: Progressing toward goals    Frequency    Min 4X/week      PT Plan Current plan remains appropriate    Co-evaluation              AM-PAC PT "6 Clicks" Mobility   Outcome Measure  Help needed turning from your back to your side while in a flat bed without using bedrails?: A Little Help needed moving from lying on your back to sitting on the side of a flat bed without using bedrails?: A Lot Help needed moving to and from a bed to a chair (including a wheelchair)?: A Lot Help needed standing up from a chair using your arms (e.g., wheelchair or bedside chair)?: A Little Help needed to walk in hospital room?: A Lot Help needed climbing 3-5 steps with a railing? : A Lot 6 Click Score: 14    End of Session   Activity Tolerance: Patient tolerated treatment well Patient left: in bed;with bed alarm set;with call bell/phone within reach;with family/visitor present Nurse Communication: Mobility status PT Visit Diagnosis: Unsteadiness on feet (R26.81);Muscle weakness (generalized)  (M62.81)     Time: 5277-8242 PT Time Calculation (min) (ACUTE ONLY): 19 min  Charges:  $Gait Training: 8-22 mins                     Marye Round, PT DPT Acute Rehabilitation Services Pager [phone]  Office [phone]    Tyrone Apple E Christain [place] 08/06/2021, 4:59 PM

## 2021-08-06 NOTE — Progress Notes (Signed)
Inpatient Rehab Admissions Coordinator:   I do not have insurance auth or a bed for this Pt. On CIR today. I will continue to follow and pursue for potential admit pending insurance auth.  Megan Salon, MS, CCC-SLP Rehab Admissions Coordinator  [phone] (celll) [phone] (office)

## 2021-08-06 NOTE — H&P (Signed)
Physical Medicine and Rehabilitation Admission H&P   CC: CVA  HPI: Alyssa Mayo is a 76 year old right-handed female with history of diabetes mellitus hypertension hyperlipidemia obesity with BMI 36.49 sleep apnea, macular degeneration.  Per chart review patient lives with her children.  1 level home 4 steps to entry.  Independent prior to admission.  Presented 08/01/2021 to [facility] after being found down by her son.  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  CT angiogram head and neck nonocclusive filling defect in the right MCA bifurcation with irregular diminished flow in the inferior M2.  No other significant stenosis or large vessel occlusion.  Echocardiogram with ejection fraction of 55 to 123456 grade 1 diastolic dysfunction.  Admission chemistry unremarkable except glucose 113 BUN 31 creatinine 1.17, WBC 14,000, urinalysis negative nitrite, hemoglobin A1c 7.4.  Presently maintained on low-dose aspirin for CVA prophylaxis given hemorrhagic transformation.  Latest CT follow-up 08/03/2021 shows no interval progression of recent right MCA infarct with petechial hemorrhage.  Cardiology services has been consulted for loop recorder placement.  Tolerating a regular consistency diet.  Therapy evaluations completed due to patient decreased functional mobility was admitted for a comprehensive rehab program. Asks if she will be able to shower.   Review of Systems  Constitutional:  Positive for malaise/fatigue. Negative for chills and fever.  HENT:  Negative for hearing loss.   Eyes:  Negative for blurred vision and double vision.  Respiratory:  Negative for cough and shortness of breath.   Cardiovascular:  Negative for chest pain, palpitations and leg swelling.  Gastrointestinal:  Positive for constipation. Negative for  heartburn, nausea and vomiting.  Genitourinary:  Negative for dysuria, flank pain and hematuria.  Musculoskeletal:  Positive for joint pain and myalgias.  Skin:  Negative for rash.  Neurological:  Positive for headaches.  All other systems reviewed and are negative. Past Medical History:  Diagnosis Date   Abnormal levels of other serum enzymes 07/17/2015   Arthritis    Constipation 07/11/2015   COVID-19 03/06/2021   Diabetes mellitus without complication (HCC)    Elevated liver enzymes    Fatty liver    Generalized abdominal pain 07/11/2015   Hyperlipidemia    Hypertension    Lifelong obesity    Macular degeneration    Morbid obesity due to excess calories ([place]) 07/11/2015   Other fatigue 07/11/2015   Sleep apnea    Spinal headache    Past Surgical History:  Procedure Laterality Date   ABDOMINAL HYSTERECTOMY     APPENDECTOMY     CATARACT EXTRACTION     CERVICAL LAMINECTOMY  07/21/1985   CESAREAN SECTION     COLONOSCOPY  07/21/2012   diverticulosis, [facility] Dr. Donnella Sham    COLONOSCOPY WITH PROPOFOL N/A 02/04/2019   Procedure: COLONOSCOPY WITH PROPOFOL;  Surgeon: Lollie Sails, MD;  Location: [facility] ENDOSCOPY;  Service: Endoscopy;  Laterality: N/A;   COLONOSCOPY WITH PROPOFOL N/A 03/18/2021   Procedure: COLONOSCOPY WITH PROPOFOL;  Surgeon: Lesly Rubenstein, MD;  Location: [facility] ENDOSCOPY;  Service: Endoscopy;  Laterality: N/A;  DM   DIAGNOSTIC LAPAROSCOPY     ESOPHAGOGASTRODUODENOSCOPY (EGD) WITH PROPOFOL N/A 02/04/2019   Procedure: ESOPHAGOGASTRODUODENOSCOPY (EGD) WITH PROPOFOL;  Surgeon: Lollie Sails, MD;  Location: [facility] ENDOSCOPY;  Service: Endoscopy;  Laterality: N/A;   ESOPHAGOGASTRODUODENOSCOPY (EGD) WITH PROPOFOL N/A 03/18/2021   Procedure: ESOPHAGOGASTRODUODENOSCOPY (EGD) WITH  PROPOFOL;  Surgeon: Lesly Rubenstein, MD;  Location: [facility] ENDOSCOPY;  Service: Endoscopy;  Laterality: N/A;   LOOP RECORDER INSERTION N/A 08/05/2021   Procedure: LOOP RECORDER INSERTION;   Surgeon: Evans Lance, MD;  Location: [facility] LAB;  Service: Cardiovascular;  Laterality: N/A;   SPINE SURGERY     TUBAL LIGATION     Family History  Problem Relation Age of Onset   Cancer Mother        breast   Heart disease Mother    Stroke Mother    Diabetes Mother    Colon cancer Mother    Cancer Father        leukemia   Stroke Father    Leukemia Father    Ovarian cancer Sister    Diabetes Brother    Cancer Brother    Stroke Maternal Grandfather    Dementia Paternal Grandmother    COPD Neg Hx    Hypertension Neg Hx    Social History:  reports that she has never smoked. She has never used smokeless tobacco. She reports that she does not currently use alcohol. She reports that she does not use drugs. Allergies:  Allergies  Allergen Reactions   Codeine Other (See Comments)    Makes her very loopy and groggy   Neosporin [Neomycin-Bacitracin Zn-Polymyx] Hives   Ozempic [Semaglutide] Hives   Polymyxin B Other (See Comments)    Eyes redness   Sulfa Antibiotics Itching   Medications Prior to Admission  Medication Sig Dispense Refill   acetaminophen (TYLENOL) 650 MG CR tablet Take 1,300 mg by mouth daily as needed for pain.     albuterol (VENTOLIN HFA) 108 (90 Base) MCG/ACT inhaler Inhale 1-2 puffs into the lungs every 6 (six) hours as needed for wheezing or shortness of breath. 1 g 0   aspirin EC 81 MG tablet Take 1 tablet (81 mg total) by mouth daily. Take at least one hour BEFORE the Celebrex (Patient taking differently: Take 81 mg by mouth every morning. Take at least one hour BEFORE the Celebrex) 30 tablet 11   celecoxib (CELEBREX) 200 MG capsule Take 1 capsule (200 mg total) by mouth daily. (Patient taking differently: Take 200 mg by mouth See admin instructions. Take one capsule (200 mg) by mouth at noon Monday thru Friday (skip Saturday and Sunday)) 90 capsule 1   Cholecalciferol 25 MCG (1000 UT) tablet Take 1,000 Units by mouth every morning.      dapagliflozin propanediol (FARXIGA) 10 MG TABS tablet Take 10 mg by mouth daily. (Patient taking differently: Take 10 mg by mouth every morning.) 90 tablet 1   gabapentin (NEURONTIN) 100 MG capsule Take 200 mg by mouth at bedtime.     glipiZIDE (GLUCOTROL XL) 5 MG 24 hr tablet Take 5 mg by mouth 2 (two) times daily with a meal.     metFORMIN (GLUCOPHAGE) 500 MG tablet Take 2 tablets (1,000 mg total) by mouth 2 (two) times daily. (Patient taking differently: Take 1,000 mg by mouth 2 (two) times daily with a meal.) 360 tablet 1   Multiple Vitamins-Minerals (PRESERVISION AREDS 2) CAPS Take 1 capsule by mouth 2 (two) times daily.     pantoprazole (PROTONIX) 40 MG tablet Take 1 tablet (40 mg total) by mouth daily. (Patient taking differently: Take 40 mg by mouth daily with supper.) 90 tablet 1   simvastatin (ZOCOR) 40 MG tablet Take 1 tablet (40 mg total) by mouth daily. (Patient taking differently: Take 40 mg by mouth at  bedtime.) 90 tablet 1   amoxicillin-clavulanate (AUGMENTIN) 875-125 MG tablet Take 1 tablet by mouth 2 (two) times daily. (Patient not taking: Reported on 08/02/2021)     azithromycin (ZITHROMAX) 250 MG tablet Take 1 tablet (250 mg total) by mouth daily. Take first 2 tablets together, then 1 every day until finished. (Patient not taking: Reported on 08/02/2021) 6 tablet 0   glucose blood (ONE TOUCH ULTRA TEST) test strip CHECK BLOOD SUGAR AS DIRECTED. 100 each 12   guaiFENesin-codeine (CHERATUSSIN AC) 100-10 MG/5ML syrup Take 10 mLs by mouth 3 (three) times daily as needed for cough. (Patient not taking: Reported on 08/02/2021) 118 mL 0    Drug Regimen Review Drug regimen was reviewed and remains appropriate with no significant issues identified  Home: Home Living Family/patient expects to be discharged to:: Inpatient rehab Living Arrangements: Children Available Help at Discharge: Family, Available 24 hours/day Type of Home: House Home Access: Stairs to enter [employer]  of Steps: 4 Entrance Stairs-Rails: Can reach both Home Layout: Two level, Able to live on main level with bedroom/bathroom Bathroom Shower/Tub: Multimedia programmer: Handicapped height Bathroom Accessibility: Yes Home Equipment: BSC/3in1, Radio producer - single point  Lives With: Son, Family   Functional History: Prior Function Prior Level of Function : Driving, Independent/Modified Independent Mobility Comments: uses cane for mobility ADLs Comments: No assist with ADL/IADL  Functional Status:  Mobility: Bed Mobility Overal bed mobility: Needs Assistance Bed Mobility: Supine to Sit, Sit to Supine Supine to sit: Mod assist Sit to supine: Min assist General bed mobility comments: session conducted from chair position in bed Transfers Overall transfer level: Needs assistance Equipment used: Straight cane Transfers: Sit to/from Stand Sit to Stand: Min assist Bed to/from chair/wheelchair/BSC transfer type:: Step pivot Step pivot transfers: Min assist General transfer comment: assist for power up and steadying, cues for hand placement when rising/sitting. Ambulation/Gait Ambulation/Gait assistance: Min assist, Mod assist Gait Distance (Feet): 100 Feet Assistive device: Straight cane Gait Pattern/deviations: Step-through pattern, Decreased stride length, Staggering left General Gait Details: min assist to steady throughout and correct trajectory to L, occasional mod assist for physically redirecting her from running into objects on L Gait velocity: decr    ADL: ADL Overall ADL's : Needs assistance/impaired Grooming: Wash/dry face, Bed level, Set up Grooming Details (indicate cue type and reason): to wash face with LUE Upper Body Bathing: Minimal assistance, Sitting Lower Body Bathing: Moderate assistance, Sit to/from stand Upper Body Dressing : Minimal assistance, Sitting Lower Body Dressing: Moderate assistance, Sit to/from stand Toilet Transfer: Minimal assistance,  Regular Toilet, Rolling walker (2 wheels), Cueing for safety, Cueing for sequencing Toilet Transfer Details (indicate cue type and reason): Min A for managment of RW and cues Toileting- Clothing Manipulation and Hygiene: Min guard, Sitting/lateral lean Toileting - Clothing Manipulation Details (indicate cue type and reason): required cue to located toilet paper to pt's L side Functional mobility during ADLs: Minimal assistance, Rolling walker (2 wheels) General ADL Comments: session focus on LUE attention and coordination to facilitate improved ADL independence  Cognition: Cognition Overall Cognitive Status: Impaired/Different from baseline Arousal/Alertness: Lethargic Orientation Level: Oriented X4 Year: 2023 Month: January Day of Week: Correct Attention: Sustained Sustained Attention: Appears intact Selective Attention: Appears intact Memory: Appears intact Awareness: Impaired Awareness Impairment: Emergent impairment Problem Solving: Impaired Problem Solving Impairment: Verbal complex, Functional complex Executive Function: Self Monitoring Self Monitoring: Impaired Self Monitoring Impairment: Functional complex Cognition Arousal/Alertness: Awake/alert, Lethargic (initially lethargic but more alert as session progressed) Behavior During Therapy: Flat  affect Overall Cognitive Status: Impaired/Different from baseline Area of Impairment: Attention, Following commands, Safety/judgement, Awareness, Problem solving, Memory Orientation Level: Disoriented to, Time Current Attention Level: Sustained Memory: Decreased recall of precautions, Decreased short-term memory Following Commands: Follows one step commands inconsistently Safety/Judgement: Decreased awareness of safety, Decreased awareness of deficits Awareness: Emergent Problem Solving: Slow processing, Difficulty sequencing, Requires verbal cues General Comments: pt continues to present with L inattention even with OTA standing  on pts L side during session. pt with decreased carryover of previous education in relation to L sided inattention.  Physical Exam: Blood pressure 93/69, pulse 87, temperature 98.4 F (36.9 C), temperature source Oral, resp. rate 16, SpO2 95 %. Gen: no distress, normal appearing HEENT: oral mucosa pink and moist, NCAT Cardio: Reg rate Chest: normal effort, normal rate of breathing Abd: soft, non-distended Ext: no edema Psych: pleasant, normal affect Skin: intact Neurological:     Comments: Patient is alert.  Makes eye contact with examiner.  No acute distress.  Follows commands.  Provides name age and date of birth.  Fair insight and awareness.  Strength intact except for LUE which has 3/5 hand grasp. Sensation intact  Results for orders placed or performed during the hospital encounter of 08/01/21 (from the past 48 hour(s))  Glucose, capillary     Status: Abnormal   Collection Time: 08/05/21 10:52 PM  Result Value Ref Range   Glucose-Capillary 183 (H) 70 - 99 mg/dL    Comment: Glucose reference range applies only to samples taken after fasting for at least 8 hours.   Comment 1 Notify RN    Comment 2 Document in Chart   Glucose, capillary     Status: Abnormal   Collection Time: 08/06/21  6:16 AM  Result Value Ref Range   Glucose-Capillary 136 (H) 70 - 99 mg/dL    Comment: Glucose reference range applies only to samples taken after fasting for at least 8 hours.   Comment 1 Notify RN    Comment 2 Document in Chart   Glucose, capillary     Status: Abnormal   Collection Time: 08/06/21 11:49 AM  Result Value Ref Range   Glucose-Capillary 133 (H) 70 - 99 mg/dL    Comment: Glucose reference range applies only to samples taken after fasting for at least 8 hours.   Comment 1 Notify RN    Comment 2 Document in Chart   Glucose, capillary     Status: Abnormal   Collection Time: 08/06/21  9:13 PM  Result Value Ref Range   Glucose-Capillary 134 (H) 70 - 99 mg/dL    Comment: Glucose  reference range applies only to samples taken after fasting for at least 8 hours.   Comment 1 Notify RN    Comment 2 Document in Chart   Glucose, capillary     Status: Abnormal   Collection Time: 08/07/21  6:11 AM  Result Value Ref Range   Glucose-Capillary 136 (H) 70 - 99 mg/dL    Comment: Glucose reference range applies only to samples taken after fasting for at least 8 hours.   Comment 1 Notify RN    Comment 2 Document in Chart   Glucose, capillary     Status: Abnormal   Collection Time: 08/07/21 11:25 AM  Result Value Ref Range   Glucose-Capillary 184 (H) 70 - 99 mg/dL    Comment: Glucose reference range applies only to samples taken after fasting for at least 8 hours.   EP PPM/ICD IMPLANT  Result Date: 08/05/2021 CONCLUSIONS:  1. Successful implantation of a [employer] Reveal LINQ implantable loop recorder for cryptogenic stroke  2. No early apparent complications. Cristopher Peru, MD 08/05/2021       Medical Problem List and Plan: 1. 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  Status post loop recorder  -may not shower until loop recorder incision has heals  -ELOS/Goals: 5-7 days S 2.  Antithrombotics: -DVT/anticoagulation:  Mechanical: Antiembolism stockings, thigh (TED hose) Bilateral lower extremities  -antiplatelet therapy: Aspirin 81 mg daily 3. Pain Management: Tylenol needed 4. Mood: Provide emotional support  -antipsychotic agents: N/A 5. Neuropsych: This patient is capable of making decisions on her own behalf. 6. Skin/Wound Care: Routine skin checks 7. Fluids/Electrolytes/Nutrition: Routine in and outs with follow-up chemistries 8.  Hypertension.  Hypotensive, decrease Norvasc to 2.5mg  daily. Monitor with increased mobility 9.  Hyperlipidemia.  Lipitor  10.  Diabetes mellitus.  Hemoglobin A1c 7.4.  Presently on SSI.  Patient on Glucotrol XL 5 mg twice daily, Glucophage 1000 mg twice daily, Farxiga 10 mg daily prior to  admission.  Uncontrolled- resume metformin once AKI resolves.  11.  Obesity.  BMI 36.49.  Dietary follow-up 12. AKI: placed nursing order to encourage 6-8 glasses of water per day  I have personally performed a face to face diagnostic evaluation, including, but not limited to relevant history and physical exam findings, of this patient and developed relevant assessment and plan.  Additionally, I have reviewed and concur with the physician assistant's documentation above.  Leeroy Cha, MD   Lavon Paganini [place], PA-C 08/07/2021

## 2021-08-06 NOTE — Progress Notes (Signed)
Pt refusing cpap for the night. ?

## 2021-08-07 ENCOUNTER — Inpatient Hospital Stay (HOSPITAL_COMMUNITY)
Admission: RE | Admit: 2021-08-07 | Discharge: 2021-08-22 | DRG: 056 | Disposition: A | Discharge status: Home Health | Payer: Medicare HMO | Source: Intra-hospital | Attending: Physical Medicine and Rehabilitation | Admitting: Physical Medicine and Rehabilitation

## 2021-08-07 ENCOUNTER — Encounter (HOSPITAL_COMMUNITY): Payer: Self-pay | Admitting: Physical Medicine and Rehabilitation

## 2021-08-07 DIAGNOSIS — Z833 Family history of diabetes mellitus: Secondary | ICD-10-CM

## 2021-08-07 DIAGNOSIS — I6931 Attention and concentration deficit following cerebral infarction: Secondary | ICD-10-CM | POA: Diagnosis present

## 2021-08-07 DIAGNOSIS — Z881 Allergy status to other antibiotic agents status: Secondary | ICD-10-CM

## 2021-08-07 DIAGNOSIS — I959 Hypotension, unspecified: Secondary | ICD-10-CM | POA: Diagnosis present

## 2021-08-07 DIAGNOSIS — I63511 Cerebral infarction due to unspecified occlusion or stenosis of right middle cerebral artery: Secondary | ICD-10-CM | POA: Diagnosis not present

## 2021-08-07 DIAGNOSIS — R350 Frequency of micturition: Secondary | ICD-10-CM | POA: Diagnosis not present

## 2021-08-07 DIAGNOSIS — R414 Neurologic neglect syndrome: Secondary | ICD-10-CM | POA: Diagnosis present

## 2021-08-07 DIAGNOSIS — R000 Tachycardia, unspecified: Secondary | ICD-10-CM | POA: Diagnosis not present

## 2021-08-07 DIAGNOSIS — Z885 Allergy status to narcotic agent status: Secondary | ICD-10-CM | POA: Diagnosis not present

## 2021-08-07 DIAGNOSIS — G936 Cerebral edema: Secondary | ICD-10-CM | POA: Diagnosis present

## 2021-08-07 DIAGNOSIS — I10 Essential (primary) hypertension: Secondary | ICD-10-CM | POA: Diagnosis present

## 2021-08-07 DIAGNOSIS — Z8249 Family history of ischemic heart disease and other diseases of the circulatory system: Secondary | ICD-10-CM | POA: Diagnosis not present

## 2021-08-07 DIAGNOSIS — Z8616 Personal history of COVID-19: Secondary | ICD-10-CM | POA: Diagnosis not present

## 2021-08-07 DIAGNOSIS — E785 Hyperlipidemia, unspecified: Secondary | ICD-10-CM | POA: Diagnosis present

## 2021-08-07 DIAGNOSIS — E1169 Type 2 diabetes mellitus with other specified complication: Secondary | ICD-10-CM | POA: Diagnosis not present

## 2021-08-07 DIAGNOSIS — K760 Fatty (change of) liver, not elsewhere classified: Secondary | ICD-10-CM | POA: Diagnosis present

## 2021-08-07 DIAGNOSIS — Z888 Allergy status to other drugs, medicaments and biological substances status: Secondary | ICD-10-CM

## 2021-08-07 DIAGNOSIS — Z803 Family history of malignant neoplasm of breast: Secondary | ICD-10-CM

## 2021-08-07 DIAGNOSIS — Z741 Need for assistance with personal care: Secondary | ICD-10-CM | POA: Diagnosis present

## 2021-08-07 DIAGNOSIS — Z7982 Long term (current) use of aspirin: Secondary | ICD-10-CM | POA: Diagnosis not present

## 2021-08-07 DIAGNOSIS — E119 Type 2 diabetes mellitus without complications: Secondary | ICD-10-CM | POA: Diagnosis present

## 2021-08-07 DIAGNOSIS — E669 Obesity, unspecified: Secondary | ICD-10-CM | POA: Diagnosis present

## 2021-08-07 DIAGNOSIS — Z806 Family history of leukemia: Secondary | ICD-10-CM | POA: Diagnosis not present

## 2021-08-07 DIAGNOSIS — Z8041 Family history of malignant neoplasm of ovary: Secondary | ICD-10-CM

## 2021-08-07 DIAGNOSIS — Z90710 Acquired absence of both cervix and uterus: Secondary | ICD-10-CM

## 2021-08-07 DIAGNOSIS — I6911 Attention and concentration deficit following nontraumatic intracerebral hemorrhage: Secondary | ICD-10-CM | POA: Diagnosis not present

## 2021-08-07 DIAGNOSIS — E8809 Other disorders of plasma-protein metabolism, not elsewhere classified: Secondary | ICD-10-CM | POA: Diagnosis present

## 2021-08-07 DIAGNOSIS — Z823 Family history of stroke: Secondary | ICD-10-CM

## 2021-08-07 DIAGNOSIS — J189 Pneumonia, unspecified organism: Secondary | ICD-10-CM | POA: Diagnosis present

## 2021-08-07 DIAGNOSIS — M79602 Pain in left arm: Secondary | ICD-10-CM | POA: Diagnosis present

## 2021-08-07 DIAGNOSIS — E876 Hypokalemia: Secondary | ICD-10-CM | POA: Diagnosis not present

## 2021-08-07 DIAGNOSIS — R059 Cough, unspecified: Secondary | ICD-10-CM | POA: Diagnosis not present

## 2021-08-07 DIAGNOSIS — K219 Gastro-esophageal reflux disease without esophagitis: Secondary | ICD-10-CM | POA: Diagnosis present

## 2021-08-07 DIAGNOSIS — Z800 Family history of malignant neoplasm of digestive organs: Secondary | ICD-10-CM

## 2021-08-07 DIAGNOSIS — Z7984 Long term (current) use of oral hypoglycemic drugs: Secondary | ICD-10-CM | POA: Diagnosis not present

## 2021-08-07 DIAGNOSIS — G4733 Obstructive sleep apnea (adult) (pediatric): Secondary | ICD-10-CM | POA: Diagnosis present

## 2021-08-07 DIAGNOSIS — K5900 Constipation, unspecified: Secondary | ICD-10-CM | POA: Diagnosis present

## 2021-08-07 DIAGNOSIS — Z5329 Procedure and treatment not carried out because of patient's decision for other reasons: Secondary | ICD-10-CM | POA: Diagnosis not present

## 2021-08-07 DIAGNOSIS — Z6836 Body mass index (BMI) 36.0-36.9, adult: Secondary | ICD-10-CM

## 2021-08-07 DIAGNOSIS — H3530 Unspecified macular degeneration: Secondary | ICD-10-CM | POA: Diagnosis present

## 2021-08-07 DIAGNOSIS — I639 Cerebral infarction, unspecified: Secondary | ICD-10-CM

## 2021-08-07 DIAGNOSIS — Z79899 Other long term (current) drug therapy: Secondary | ICD-10-CM

## 2021-08-07 DIAGNOSIS — K5901 Slow transit constipation: Secondary | ICD-10-CM | POA: Diagnosis not present

## 2021-08-07 DIAGNOSIS — N179 Acute kidney failure, unspecified: Secondary | ICD-10-CM | POA: Diagnosis present

## 2021-08-07 DIAGNOSIS — G4730 Sleep apnea, unspecified: Secondary | ICD-10-CM | POA: Diagnosis present

## 2021-08-07 LAB — GLUCOSE, CAPILLARY
Glucose-Capillary: 136 mg/dL — ABNORMAL HIGH (ref 70–99)
Glucose-Capillary: 184 mg/dL — ABNORMAL HIGH (ref 70–99)
Glucose-Capillary: 126 mg/dL — ABNORMAL HIGH (ref 70–99)
Glucose-Capillary: 157 mg/dL — ABNORMAL HIGH (ref 70–99)

## 2021-08-07 MED ORDER — B COMPLEX-C PO TABS
1.0000 | ORAL_TABLET | Freq: Every day | ORAL | Status: DC
  Administered 2021-08-07 – 2021-08-22 (×16): 1 via ORAL
  Filled 2021-08-07 (×16): qty 1

## 2021-08-07 MED ORDER — ACETAMINOPHEN 650 MG RE SUPP: 650.0000 mg | RECTAL | Status: DC | PRN

## 2021-08-07 MED ORDER — SENNOSIDES-DOCUSATE SODIUM 8.6-50 MG PO TABS
1.0000 | ORAL_TABLET | Freq: Every evening | ORAL | Status: DC | PRN
  Administered 2021-08-16: 1 via ORAL
  Filled 2021-08-07: qty 1

## 2021-08-07 MED ORDER — PANTOPRAZOLE SODIUM 40 MG PO TBEC
40.0000 mg | DELAYED_RELEASE_TABLET | Freq: Every day | ORAL | Status: DC
  Administered 2021-08-08 – 2021-08-10 (×3): 40 mg via ORAL
  Filled 2021-08-07 (×3): qty 1

## 2021-08-07 MED ORDER — EXERCISE FOR HEART AND HEALTH BOOK
Freq: Once | Status: AC
  Filled 2021-08-07: qty 1

## 2021-08-07 MED ORDER — AMLODIPINE BESYLATE 5 MG PO TABS: 5.0000 mg | ORAL_TABLET | Freq: Every day | ORAL | Status: DC

## 2021-08-07 MED ORDER — ACETAMINOPHEN 325 MG PO TABS
650.0000 mg | ORAL_TABLET | ORAL | Status: DC | PRN
  Administered 2021-08-08 – 2021-08-21 (×5): 650 mg via ORAL
  Filled 2021-08-07 (×5): qty 2

## 2021-08-07 MED ORDER — INSULIN ASPART 100 UNIT/ML IJ SOLN
0.0000 [IU] | Freq: Three times a day (TID) | INTRAMUSCULAR | Status: DC
  Administered 2021-08-07: 2 [IU] via SUBCUTANEOUS
  Administered 2021-08-08 (×3): 3 [IU] via SUBCUTANEOUS
  Administered 2021-08-09 (×2): 2 [IU] via SUBCUTANEOUS
  Administered 2021-08-09 – 2021-08-10 (×2): 3 [IU] via SUBCUTANEOUS
  Administered 2021-08-10: 2 [IU] via SUBCUTANEOUS
  Administered 2021-08-10 – 2021-08-11 (×3): 3 [IU] via SUBCUTANEOUS
  Administered 2021-08-11 – 2021-08-12 (×2): 2 [IU] via SUBCUTANEOUS
  Administered 2021-08-12: 3 [IU] via SUBCUTANEOUS

## 2021-08-07 MED ORDER — ASPIRIN EC 81 MG PO TBEC
81.0000 mg | DELAYED_RELEASE_TABLET | Freq: Every day | ORAL | Status: DC
  Administered 2021-08-08 – 2021-08-22 (×15): 81 mg via ORAL
  Filled 2021-08-07 (×15): qty 1

## 2021-08-07 MED ORDER — BLOOD PRESSURE CONTROL BOOK
Freq: Once | Status: AC
  Filled 2021-08-07: qty 1

## 2021-08-07 MED ORDER — ACETAMINOPHEN 160 MG/5ML PO SOLN: 650.0000 mg | ORAL | Status: DC | PRN

## 2021-08-07 MED ORDER — AMLODIPINE BESYLATE 5 MG PO TABS
5.0000 mg | ORAL_TABLET | Freq: Every day | ORAL | Status: DC
  Administered 2021-08-08: 5 mg via ORAL
  Filled 2021-08-07: qty 1

## 2021-08-07 MED ORDER — ALBUTEROL SULFATE (2.5 MG/3ML) 0.083% IN NEBU: 2.5000 mg | INHALATION_SOLUTION | Freq: Four times a day (QID) | RESPIRATORY_TRACT | Status: DC | PRN

## 2021-08-07 MED ORDER — ATORVASTATIN CALCIUM 10 MG PO TABS
20.0000 mg | ORAL_TABLET | Freq: Every day | ORAL | Status: DC
  Administered 2021-08-08 – 2021-08-22 (×15): 20 mg via ORAL
  Filled 2021-08-07 (×15): qty 2

## 2021-08-07 NOTE — H&P (Signed)
Physical Medicine and Rehabilitation Admission H&P   CC: CVA  HPI: Alyssa Mayo is a 76 year old right-handed female with history of diabetes mellitus hypertension hyperlipidemia obesity with BMI 36.49 sleep apnea, macular degeneration.  Per chart review patient lives with her children.  1 level home 4 steps to entry.  Independent prior to admission.  Presented 08/01/2021 to [facility] after being found down by her son.  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  CT angiogram head and neck nonocclusive filling defect in the right MCA bifurcation with irregular diminished flow in the inferior M2.  No other significant stenosis or large vessel occlusion.  Echocardiogram with ejection fraction of 55 to 123456 grade 1 diastolic dysfunction.  Admission chemistry unremarkable except glucose 113 BUN 31 creatinine 1.17, WBC 14,000, urinalysis negative nitrite, hemoglobin A1c 7.4.  Presently maintained on low-dose aspirin for CVA prophylaxis given hemorrhagic transformation.  Latest CT follow-up 08/03/2021 shows no interval progression of recent right MCA infarct with petechial hemorrhage.  Cardiology services has been consulted for loop recorder placement.  Tolerating a regular consistency diet.  Therapy evaluations completed due to patient decreased functional mobility was admitted for a comprehensive rehab program. Asks if she will be able to shower and she can after loop recorder incision has healed.   Review of Systems  Constitutional:  Positive for malaise/fatigue. Negative for chills and fever.  HENT:  Negative for hearing loss.   Eyes:  Negative for blurred vision and double vision.  Respiratory:  Negative for cough and shortness of breath.   Cardiovascular:  Negative for chest pain, palpitations and leg swelling.   Gastrointestinal:  Positive for constipation. Negative for heartburn, nausea and vomiting.  Genitourinary:  Negative for dysuria, flank pain and hematuria.  Musculoskeletal:  Positive for joint pain and myalgias.  Skin:  Negative for rash.  Neurological:  Positive for headaches.  All other systems reviewed and are negative. Past Medical History:  Diagnosis Date   Abnormal levels of other serum enzymes 07/17/2015   Arthritis    Constipation 07/11/2015   COVID-19 03/06/2021   Diabetes mellitus without complication (HCC)    Elevated liver enzymes    Fatty liver    Generalized abdominal pain 07/11/2015   Hyperlipidemia    Hypertension    Lifelong obesity    Macular degeneration    Morbid obesity due to excess calories ([place]) 07/11/2015   Other fatigue 07/11/2015   Sleep apnea    Spinal headache    Past Surgical History:  Procedure Laterality Date   ABDOMINAL HYSTERECTOMY     APPENDECTOMY     CATARACT EXTRACTION     CERVICAL LAMINECTOMY  07/21/1985   CESAREAN SECTION     COLONOSCOPY  07/21/2012   diverticulosis, [facility] Dr. Donnella Sham    COLONOSCOPY WITH PROPOFOL N/A 02/04/2019   Procedure: COLONOSCOPY WITH PROPOFOL;  Surgeon: Lollie Sails, MD;  Location[facility] ENDOSCOPY;  Service: Endoscopy;  Laterality: N/A;   COLONOSCOPY WITH PROPOFOL N/A 03/18/2021   Procedure: COLONOSCOPY WITH PROPOFOL;  Surgeon: Lesly Rubenstein, MD;  Location: [facility] ENDOSCOPY;  Service: Endoscopy;  Laterality: N/A;  DM   DIAGNOSTIC LAPAROSCOPY     ESOPHAGOGASTRODUODENOSCOPY (EGD) WITH PROPOFOL N/A 02/04/2019   Procedure: ESOPHAGOGASTRODUODENOSCOPY (EGD) WITH PROPOFOL;  Surgeon: Lollie Sails, MD;  Location: [facility] ENDOSCOPY;  Service: Endoscopy;  Laterality: N/A;   ESOPHAGOGASTRODUODENOSCOPY (EGD) WITH  PROPOFOL N/A 03/18/2021   Procedure: ESOPHAGOGASTRODUODENOSCOPY (EGD) WITH PROPOFOL;  Surgeon: Lesly Rubenstein, MD;  Location: [facility] ENDOSCOPY;  Service: Endoscopy;  Laterality: N/A;   LOOP RECORDER  INSERTION N/A 08/05/2021   Procedure: LOOP RECORDER INSERTION;  Surgeon: Evans Lance, MD;  Location: [place] CV LAB;  Service: Cardiovascular;  Laterality: N/A;   SPINE SURGERY     TUBAL LIGATION     Family History  Problem Relation Age of Onset   Cancer Mother        breast   Heart disease Mother    Stroke Mother    Diabetes Mother    Colon cancer Mother    Cancer Father        leukemia   Stroke Father    Leukemia Father    Ovarian cancer Sister    Diabetes Brother    Cancer Brother    Stroke Maternal Grandfather    Dementia Paternal Grandmother    COPD Neg Hx    Hypertension Neg Hx    Social History:  reports that she has never smoked. She has never used smokeless tobacco. She reports that she does not currently use alcohol. She reports that she does not use drugs. Allergies:  Allergies  Allergen Reactions   Codeine Other (See Comments)    Makes her very loopy and groggy   Neosporin [Neomycin-Bacitracin Zn-Polymyx] Hives   Ozempic [Semaglutide] Hives   Polymyxin B Other (See Comments)    Eyes redness   Sulfa Antibiotics Itching   Medications Prior to Admission  Medication Sig Dispense Refill   acetaminophen (TYLENOL) 650 MG CR tablet Take 1,300 mg by mouth daily as needed for pain.     albuterol (VENTOLIN HFA) 108 (90 Base) MCG/ACT inhaler Inhale 1-2 puffs into the lungs every 6 (six) hours as needed for wheezing or shortness of breath. 1 g 0   amLODipine (NORVASC) 10 MG tablet Take 1 tablet (10 mg total) by mouth daily.     aspirin EC 81 MG tablet Take 1 tablet (81 mg total) by mouth daily. Take at least one hour BEFORE the Celebrex (Patient taking differently: Take 81 mg by mouth every morning. Take at least one hour BEFORE the Celebrex) 30 tablet 11   atorvastatin (LIPITOR) 20 MG tablet Take 1 tablet (20 mg total) by mouth daily. 30 tablet 0   Cholecalciferol 25 MCG (1000 UT) tablet Take 1,000 Units by mouth every morning.     dapagliflozin propanediol  (FARXIGA) 10 MG TABS tablet Take 10 mg by mouth daily. (Patient taking differently: Take 10 mg by mouth every morning.) 90 tablet 1   gabapentin (NEURONTIN) 100 MG capsule Take 200 mg by mouth at bedtime.     glipiZIDE (GLUCOTROL XL) 5 MG 24 hr tablet Take 5 mg by mouth 2 (two) times daily with a meal.     glucose blood (ONE TOUCH ULTRA TEST) test strip CHECK BLOOD SUGAR AS DIRECTED. 100 each 12   guaiFENesin-codeine (CHERATUSSIN AC) 100-10 MG/5ML syrup Take 10 mLs by mouth 3 (three) times daily as needed for cough. (Patient not taking: Reported on 08/02/2021) 118 mL 0   lisinopril (ZESTRIL) 40 MG tablet Take 1 tablet (40 mg total) by mouth daily. 30 tablet 0   metFORMIN (GLUCOPHAGE) 500 MG tablet Take 2 tablets (1,000 mg total) by mouth 2 (two) times daily. (Patient taking differently: Take 1,000 mg by mouth 2 (two) times daily with a meal.) 360 tablet 1   Multiple Vitamins-Minerals (PRESERVISION AREDS 2)  CAPS Take 1 capsule by mouth 2 (two) times daily.     pantoprazole (PROTONIX) 40 MG tablet Take 1 tablet (40 mg total) by mouth daily. (Patient taking differently: Take 40 mg by mouth daily with supper.) 90 tablet 1    Drug Regimen Review Drug regimen was reviewed and remains appropriate with no significant issues identified  Home: Home Living Family/patient expects to be discharged to:: Inpatient rehab Living Arrangements: Children Available Help at Discharge: Family, Available 24 hours/day Type of Home: House Home Access: Stairs to enter [employer] of Steps: 4 Entrance Stairs-Rails: Can reach both Home Layout: Two level, Able to live on main level with bedroom/bathroom Bathroom Shower/Tub: Multimedia programmer: Handicapped height Bathroom Accessibility: Yes Home Equipment: BSC/3in1, Radio producer - single point  Lives With: Son, Family   Functional History: Prior Function Prior Level of Function : Driving, Independent/Modified Independent Mobility Comments: uses cane  for mobility ADLs Comments: No assist with ADL/IADL   Functional Status:  Mobility: Bed Mobility Overal bed mobility: Needs Assistance Bed Mobility: Supine to Sit, Sit to Supine Supine to sit: Mod assist Sit to supine: Min assist General bed mobility comments: session conducted from chair position in bed Transfers Overall transfer level: Needs assistance Equipment used: Straight cane Transfers: Sit to/from Stand Sit to Stand: Min assist Bed to/from chair/wheelchair/BSC transfer type:: Step pivot Step pivot transfers: Min assist General transfer comment: assist for power up and steadying, cues for hand placement when rising/sitting. Ambulation/Gait Ambulation/Gait assistance: Min assist, Mod assist Gait Distance (Feet): 100 Feet Assistive device: Straight cane Gait Pattern/deviations: Step-through pattern, Decreased stride length, Staggering left General Gait Details: min assist to steady throughout and correct trajectory to L, occasional mod assist for physically redirecting her from running into objects on L Gait velocity: decr   ADL: ADL Overall ADL's : Needs assistance/impaired Grooming: Wash/dry face, Bed level, Set up Grooming Details (indicate cue type and reason): to wash face with LUE Upper Body Bathing: Minimal assistance, Sitting Lower Body Bathing: Moderate assistance, Sit to/from stand Upper Body Dressing : Minimal assistance, Sitting Lower Body Dressing: Moderate assistance, Sit to/from stand Toilet Transfer: Minimal assistance, Regular Toilet, Rolling walker (2 wheels), Cueing for safety, Cueing for sequencing Toilet Transfer Details (indicate cue type and reason): Min A for managment of RW and cues Toileting- Clothing Manipulation and Hygiene: Min guard, Sitting/lateral lean Toileting - Clothing Manipulation Details (indicate cue type and reason): required cue to located toilet paper to pt's L side Functional mobility during ADLs: Minimal assistance, Rolling  walker (2 wheels) General ADL Comments: session focus on LUE attention and coordination to facilitate improved ADL independence   Cognition: Cognition Overall Cognitive Status: Impaired/Different from baseline Arousal/Alertness: Lethargic Orientation Level: Oriented X4 Year: 2023 Month: January Day of Week: Correct Attention: Sustained Sustained Attention: Appears intact Selective Attention: Appears intact Memory: Appears intact Awareness: Impaired Awareness Impairment: Emergent impairment Problem Solving: Impaired Problem Solving Impairment: Verbal complex, Functional complex Executive Function: Self Monitoring Self Monitoring: Impaired Self Monitoring Impairment: Functional complex Cognition Arousal/Alertness: Awake/alert, Lethargic (initially lethargic but more alert as session progressed) Behavior During Therapy: Flat affect Overall Cognitive Status: Impaired/Different from baseline Area of Impairment: Attention, Following commands, Safety/judgement, Awareness, Problem solving, Memory Orientation Level: Disoriented to, Time Current Attention Level: Sustained Memory: Decreased recall of precautions, Decreased short-term memory Following Commands: Follows one step commands inconsistently Safety/Judgement: Decreased awareness of safety, Decreased awareness of deficits Awareness: Emergent Problem Solving: Slow processing, Difficulty sequencing, Requires verbal cues General Comments: pt continues to present with  L inattention even with OTA standing on pts L side during session. pt with decreased carryover of previous education in relation to L sided inattention.  Physical Exam: Blood pressure (!) 118/53, pulse 75, temperature 98.6 F (37 C), temperature source Oral, resp. rate 18, height 5\' 3"  (1.6 m), weight 93.4 kg, SpO2 96 %. Gen: no distress, normal appearing HEENT: oral mucosa pink and moist, NCAT Cardio: Reg rate Chest: normal effort, normal rate of breathing Abd:  soft, non-distended Ext: no edema Psych: pleasant, normal affect Skin: intact Neurological:     Comments: Patient is alert.  Makes eye contact with examiner.  No acute distress.  Follows commands.  Provides name age and date of birth.  Fair insight and awareness.  Strength intact except for LUE which has 3/5 hand grasp. Sensation intact  Results for orders placed or performed during the hospital encounter of 08/01/21 (from the past 48 hour(s))  Glucose, capillary     Status: Abnormal   Collection Time: 08/05/21 10:52 PM  Result Value Ref Range   Glucose-Capillary 183 (H) 70 - 99 mg/dL    Comment: Glucose reference range applies only to samples taken after fasting for at least 8 hours.   Comment 1 Notify RN    Comment 2 Document in Chart   Glucose, capillary     Status: Abnormal   Collection Time: 08/06/21  6:16 AM  Result Value Ref Range   Glucose-Capillary 136 (H) 70 - 99 mg/dL    Comment: Glucose reference range applies only to samples taken after fasting for at least 8 hours.   Comment 1 Notify RN    Comment 2 Document in Chart   Glucose, capillary     Status: Abnormal   Collection Time: 08/06/21 11:49 AM  Result Value Ref Range   Glucose-Capillary 133 (H) 70 - 99 mg/dL    Comment: Glucose reference range applies only to samples taken after fasting for at least 8 hours.   Comment 1 Notify RN    Comment 2 Document in Chart   Glucose, capillary     Status: Abnormal   Collection Time: 08/06/21  9:13 PM  Result Value Ref Range   Glucose-Capillary 134 (H) 70 - 99 mg/dL    Comment: Glucose reference range applies only to samples taken after fasting for at least 8 hours.   Comment 1 Notify RN    Comment 2 Document in Chart   Glucose, capillary     Status: Abnormal   Collection Time: 08/07/21  6:11 AM  Result Value Ref Range   Glucose-Capillary 136 (H) 70 - 99 mg/dL    Comment: Glucose reference range applies only to samples taken after fasting for at least 8 hours.   Comment 1  Notify RN    Comment 2 Document in Chart   Glucose, capillary     Status: Abnormal   Collection Time: 08/07/21 11:25 AM  Result Value Ref Range   Glucose-Capillary 184 (H) 70 - 99 mg/dL    Comment: Glucose reference range applies only to samples taken after fasting for at least 8 hours.   EP PPM/ICD IMPLANT  Result Date: 08/05/2021 CONCLUSIONS:  1. Successful implantation of a [employer]onic Reveal LINQ implantable loop recorder for cryptogenic stroke  2. No early apparent complications. Cristopher Peru, MD 08/05/2021       Medical Problem List and Plan: 1. 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  Status post loop recorder  -may not shower until loop recorder incision  has heals  -ELOS/Goals: 5-7 days S 2.  Antithrombotics: -DVT/anticoagulation:  Mechanical: Antiembolism stockings, thigh (TED hose) Bilateral lower extremities  -antiplatelet therapy: Aspirin 81 mg daily 3. Pain Management: Tylenol needed 4. Mood: Provide emotional support  -antipsychotic agents: N/A 5. Neuropsych: This patient is capable of making decisions on her own behalf. 6. Skin/Wound Care: Routine skin checks 7. Fluids/Electrolytes/Nutrition: Routine in and outs with follow-up chemistries 8.  Hypertension.  Hypotensive, decrease Norvasc to 2.5mg  daily. Monitor with increased mobility 9.  Hyperlipidemia.  Lipitor  10.  Diabetes mellitus.  Hemoglobin A1c 7.4.  Presently on SSI.  Patient on Glucotrol XL 5 mg twice daily, Glucophage 1000 mg twice daily, Farxiga 10 mg daily prior to admission.  Uncontrolled- resume metformin once AKI resolves.  11.  Obesity.  BMI 36.49.  Dietary follow-up 12. AKI: placed nursing order to encourage 6-8 glasses of water per day. Repeat creatinine tomorrow.   I have personally performed a face to face diagnostic evaluation, including, but not limited to relevant history and physical exam findings, of this patient and developed relevant assessment and  plan.  Additionally, I have reviewed and concur with the physician assistant's documentation above.  Lavon Paganini Angiulli, PA-C   Izora Ribas, MD 08/07/2021

## 2021-08-07 NOTE — Progress Notes (Signed)
Patient ID: Alyssa Mayo, female   DOB: [birth year], 76 y.o.   MRN: [id number] Met with the patient to introduce self and role. Reviewed current situation and rehab process, team conference and plan of care. Discussed secondary risk management including HTN, HLD (LDL 93/Trig 243), DM (A1c 7.4) and weight. Patient noted taking oral agents for DM PTA and son assists her with the cooking. Continue to follow along to discharge to address educational needs and facilitate preparation for discharge home with son. Margarito Liner

## 2021-08-07 NOTE — Progress Notes (Signed)
Inpatient Rehab Admissions Coordinator:    I received insurance auth and have a CIR bed for this Pt. Today. RN may call report to [phone].  Clemens Catholic, [place], [place] Admissions Coordinator  [phone] ([place]) [phone] (office)

## 2021-08-07 NOTE — Assessment & Plan Note (Signed)
Continue statin. 

## 2021-08-07 NOTE — Progress Notes (Signed)
Patient refused CPAP for the night  

## 2021-08-07 NOTE — Progress Notes (Signed)
Inpatient Rehabilitation Admission Medication Review by a Pharmacist  A complete drug regimen review was completed for this patient to identify any potential clinically significant medication issues.  High Risk Drug Classes Is patient taking? Indication by Medication  Antipsychotic No   Anticoagulant No   Antibiotic No   Opioid No   Antiplatelet Yes ASA - CVA  Hypoglycemics/insulin Yes SSI - DM  Vasoactive Medication Yes Amlodipine - HTN  Chemotherapy No   Other Yes Atorvastatin - HLD Protonix - GERD     Type of Medication Issue Identified Description of Issue Recommendation(s)  Drug Interaction(s) (clinically significant)     Duplicate Therapy     Allergy     No Medication Administration End Date     Incorrect Dose     Additional Drug Therapy Needed     Significant med changes from prior encounter (inform family/care partners about these prior to discharge).    Other       Clinically significant medication issues were identified that warrant physician communication and completion of prescribed/recommended actions by midnight of the next day:  No  Name of provider notified for urgent issues identified:   Provider Method of Notification:     Pharmacist comments:   Time spent performing this drug regimen review (minutes):  15

## 2021-08-07 NOTE — Progress Notes (Signed)
Inpatient Rehab Admissions Coordinator:   I do not have insurance auth or a bed to offer this Pt. Today. I will continue to follow for potential admit pending insurance auth.   Megan Salon, MS, CCC-SLP Rehab Admissions Coordinator  [phone] ([place]) [phone] (office)

## 2021-08-07 NOTE — Assessment & Plan Note (Signed)
HbA1c 7.4.  Home medications include Farxiga, glipizide and metformin.  Currently on SSI.  CBGs are reasonably well controlled.

## 2021-08-07 NOTE — Progress Notes (Signed)
[facility] PROGRESS NOTE   Alyssa Mayo [id number] DOB: [birth year] DOA: 08/01/2021  6 DOS: the patient was seen and examined on 08/07/2021  PCP: Maryland Pink, MD  Brief History and Hospital Course:  76 y.o. female with history of diabetes, hypertension, hyperlipidemia, obesity, and sleep apnea presenting to [facility] after being found down by her son. Her last know well was approximately 07/29/2021.  Evaluation revealed acute stroke.  Patient was transferred to [facility].  Consultants: Neurology  Procedures: Loop recorder placement.  Transthoracic echocardiogram.  Lower extremity Doppler study.    Subjective: Patient noted to be somnolent this morning though easily arousable.  Denies any complaints.    Assessment/Plan:  * Acute ischemic stroke [facility])- (present on admission) MRI revealed right MCA infarct.  Echocardiogram showed EF of 60 to 65%.  LDL 93.  HbA1c 7.4.  Lower extremity Dopplers negative for DVT.  Currently only on aspirin.  No dual therapy due to hemorrhagic transformation. Underwent loop recorder placement. Seen by PT and OT.  Inpatient rehabilitation is recommended.  Patient is on statin.  Cerebral edema ([place])- (present on admission) Noted to have cerebral edema on imaging studies.  Was transferred to [facility].  Placed on hypertonic saline.  Stable currently.  DM (diabetes mellitus), type 2 with neurological complications ([place])- (present on admission) HbA1c 7.4.  Home medications include Farxiga, glipizide and metformin.  Currently on SSI.  CBGs are reasonably well controlled.  Essential hypertension- (present on admission) Noted to have low blood pressures this morning.  We will reduce the dose of amlodipine and hold her ACE inhibitor for now.  Check labs in the morning.  Asymptomatic.  Hyperlipidemia- (present on admission) Continue statin.  OSA (obstructive sleep apnea)- (present on admission) CPAP.    Obesity Estimated body  mass index is 36.49 kg/m as calculated from the following:   Height as of an earlier encounter on 08/01/21: 5\' 3"  (1.6 m).   Weight as of an earlier encounter on 08/01/21: 93.4 kg.   DVT Prophylaxis: No heparin products due to hemorrhagic transformation Code Status: Full code Family Communication: Discussed with patient.  No family at bedside Disposition Plan: Waiting placement to inpatient rehabilitation  Status is: Inpatient  Remains inpatient appropriate because: Acute stroke         Medications: Scheduled:  [START ON 08/08/2021] amLODipine  5 mg Oral Daily   aspirin EC  81 mg Oral Daily   atorvastatin  20 mg Oral Daily   pantoprazole  40 mg Oral Daily   Continuous: [id number] **OR** acetaminophen (TYLENOL) oral liquid 160 mg/5 mL **OR** acetaminophen, albuterol, ondansetron (ZOFRAN) IV, senna-docusate  Antibiotics: Anti-infectives (From admission, onward)    None       Objective:  Vital Signs  Vitals:   08/06/21 2339 08/07/21 0755 08/07/21 0800 08/07/21 1026  BP:  (!) 119/45  93/69  Pulse:  83  87  Resp:  18  16  Temp:  98.2 F (36.8 C)  98.4 F (36.9 C)  TempSrc:  Oral  Oral  SpO2: 100%  96% 95%   No intake or output data in the 24 hours ending 08/07/21 1245 There were no vitals filed for this visit.  General appearance: Somnolent but easily arousable Resp: Clear to auscultation bilaterally.  Normal effort Cardio: S1-S2 is normal regular.  No S3-S4.  No rubs murmurs or bruit GI: Abdomen is soft.  Nontender nondistended.  Bowel sounds are present normal.  No masses organomegaly Extremities: No edema.  Lab Results:  Data Reviewed: I have personally reviewed labs and imaging study reports  CBC: Recent Labs  Lab 08/01/21 1019 08/02/21 1151  WBC 14.0* 13.9*  NEUTROABS 11.0*  --   HGB 14.1 13.8  HCT 44.2 44.8  MCV 89.1 90.9  PLT 308 [id number]    Basic Metabolic Panel: Recent Labs  Lab 08/01/21 1019 08/01/21 1458 08/01/21 2315  08/02/21 1151  NA 137 135 145 141  K 4.2  --   --  4.3  CL 101  --   --  111  CO2 24  --   --  17*  GLUCOSE 113*  --   --  98  BUN 31*  --   --  26*  CREATININE 1.17*  --   --  1.05*  CALCIUM 9.5  --   --  8.9    GFR: Estimated Creatinine Clearance: 49.5 mL/min (A) (by C-G formula based on SCr of 1.05 mg/dL (H)).  Liver Function Tests: Recent Labs  Lab 08/01/21 1019  AST 14*  ALT 16  ALKPHOS 124  BILITOT 0.7  PROT 7.9  ALBUMIN 3.8     Coagulation Profile: Recent Labs  Lab 08/01/21 1019  INR 1.1    CBG: Recent Labs  Lab 08/06/21 0616 08/06/21 1149 08/06/21 2113 08/07/21 0611 08/07/21 1125  GLUCAP 136* 133* 134* 136* 184*     Recent Results (from the past 240 hour(s))  Resp Panel by RT-PCR (Flu A&B, Covid) Nasopharyngeal Swab     Status: None   Collection Time: 08/01/21 10:19 AM   Specimen: Nasopharyngeal Swab; Nasopharyngeal(NP) swabs in vial transport medium  Result Value Ref Range Status   SARS Coronavirus 2 by RT PCR NEGATIVE NEGATIVE Final    Comment: (NOTE) SARS-CoV-2 target nucleic acids are NOT DETECTED.  The SARS-CoV-2 RNA is generally detectable in upper respiratory specimens during the acute phase of infection. The lowest concentration of SARS-CoV-2 viral copies this assay can detect is 138 copies/mL. A negative result does not preclude SARS-Cov-2 infection and should not be used as the sole basis for treatment or other patient management decisions. A negative result may occur with  improper specimen collection/handling, submission of specimen other than nasopharyngeal swab, presence of viral mutation(s) within the areas targeted by this assay, and inadequate number of viral copies(<138 copies/mL). A negative result must be combined with clinical observations, patient history, and epidemiological information. The expected result is Negative.  Fact Sheet for Patients:  EntrepreneurPulse.com.au  Fact Sheet for Healthcare  Providers:  IncredibleEmployment.be  This test is no t yet approved or cleared by the [place] FDA and  has been authorized for detection and/or diagnosis of SARS-CoV-2 by FDA under an Emergency Use Authorization (EUA). This EUA will remain  in effect (meaning this test can be used) for the duration of the COVID-19 declaration under Section 564(b)(1) of the Act, 21 U.S.C.section 360bbb-3(b)(1), unless the authorization is terminated  or revoked sooner.       Influenza A by PCR NEGATIVE NEGATIVE Final   Influenza B by PCR NEGATIVE NEGATIVE Final    Comment: (NOTE) The Xpert Xpress SARS-CoV-2/FLU/RSV plus assay is intended as an aid in the diagnosis of influenza from Nasopharyngeal swab specimens and should not be used as a sole basis for treatment. Nasal washings and aspirates are unacceptable for Xpert Xpress SARS-CoV-2/FLU/RSV testing.  Fact Sheet for Patients: EntrepreneurPulse.com.au  Fact Sheet for Healthcare Providers: IncredibleEmployment.be  This test is not yet approved or cleared by the [place] FDA  and has been authorized for detection and/or diagnosis of SARS-CoV-2 by FDA under an Emergency Use Authorization (EUA). This EUA will remain in effect (meaning this test can be used) for the duration of the COVID-19 declaration under Section 564(b)(1) of the Act, 21 U.S.C. section 360bbb-3(b)(1), unless the authorization is terminated or revoked.  Performed at [facility], [address], [place], [place] [address]       Radiology Studies: EP PPM/ICD IMPLANT  Result Date: 08/05/2021 CONCLUSIONS:  1. Successful implantation of a Medtronic Reveal LINQ implantable loop recorder for cryptogenic stroke  2. No early apparent complications. Cristopher Peru, MD 08/05/2021       LOS: 6 days   [facility] Pager on [email or web address]  08/07/2021, 12:45 PM

## 2021-08-07 NOTE — Progress Notes (Signed)
PMR Admission Coordinator Pre-Admission Assessment   Patient: Alyssa Mayo is an 76 y.o., female MRN: [id number] DOB: Mar 17, 1946 Height:   Weight:     Insurance Information HMO:     PPO: yes     PCP:      IPA:      80/20:      OTHER:  PRIMARY: [employer] Medicare      Policy#: [id number]      Subscriber: patient CM Name: Marcene Brawn       Phone#: [phone]     Fax#: [place] with [employer] called with approval on 08/07/21 for admission 1/18-1/24/23 with updates due 4/16 Pre-Cert#: [id number]      Employer:  Benefits:  Phone #: [phone]     Name:  Eff. Date: 07/21/20-still active      Deduct: does not have one   Out of Pocket Max: $1,000 ($10 met)    Life Max: NA CIR: $150 co-pay/admission      SNF: 100% coverage Outpatient: $20/visit co-pay     Co-Pay:  Home Health: 100% coverage      Co-Pay:  DME: 100% coverage     Co-Pay:  Providers: in-network SECONDARY:       Policy#:      Phone#:    Development worker, community:       Phone#:    The [employer] for patients in Inpatient Rehabilitation Facilities with attached Privacy Act [place] Records was provided and verbally reviewed with: Patient   Emergency Contact Information Contact Information       Name Relation Home Work [place] Son [phone]   [phone]           Current Medical History  Patient Admitting Diagnosis: R CVA History of Present Illness: Pt is a 76 year old female with medical hx significant for: HTN, diabetes, hyperlipidemia, obesity, macular degeneration, sleep apnea. Pt [facility] to [facility] on 08/01[facility]21 d/t being found on the floor with left-sided weakness. Pt not a candidate for tPA or TNKase. CT head concerning for infarct versus mass in right MCA territory. MRI revealed right MCA embolic stroke with petechial hemorrhages.  Pt transferred to [facility] for further care. CT angiogram with nonocclusive filling defect in  right MCA bifurcation and irregular and diminished flow in inferior division of right M2. EF 60 to 65%. LE venous Doppler negative for DVT.  Therapy evaluations completed and CIR recommended d/t pt's deficits in functional mobility and ability to complete ADLs independently.  Complete NIHSS TOTAL: 4   Patient's medical record from [facility] has been reviewed by the rehabilitation admission coordinator and physician.   Past Medical History      Past Medical History:  Diagnosis Date   Abnormal levels of other serum enzymes 07/17/2015   Arthritis     Constipation 07/11/2015   COVID-19 03/06/2021   Diabetes mellitus without complication (HCC)     Elevated liver enzymes     Fatty liver     Generalized abdominal pain 07/11/2015   Hyperlipidemia     Hypertension     Lifelong obesity     Macular degeneration     Morbid obesity due to excess calories ([place]) 07/11/2015   Other fatigue 07/11/2015   Sleep apnea     Spinal headache        Has the patient had major surgery during 100 days prior to admission? No   Family History   family history includes Cancer in  her brother, father, and mother; Colon cancer in her mother; Dementia in her paternal grandmother; Diabetes in her brother and mother; Heart disease in her mother; Leukemia in her father; Ovarian cancer in her sister; Stroke in her father, maternal grandfather, and mother.   Current Medications   Current Facility-Administered Medications:    acetaminophen (TYLENOL) tablet 650 mg, 650 mg, Oral, Q4H PRN, 650 mg at 08/03/21 0005 **OR** acetaminophen (TYLENOL) 160 MG/5ML solution 650 mg, 650 mg, Per Tube, Q4H PRN **OR** acetaminophen (TYLENOL) suppository 650 mg, 650 mg, Rectal, Q4H PRN, Amie Portland, MD   albuterol (PROVENTIL) (2.5 MG/3ML) 0.083% nebulizer solution 2.5 mg, 2.5 mg, Inhalation, Q6H PRN, Rosalin Hawking, MD   amLODipine (NORVASC) tablet 10 mg, 10 mg, Oral, Daily, Rosalin Hawking, MD, 10 mg at 08/05/21 5003   aspirin EC  tablet 81 mg, 81 mg, Oral, Daily, Leonie Man, Pramod S, MD, 81 mg at 08/05/21 0902   atorvastatin (LIPITOR) tablet 20 mg, 20 mg, Oral, Daily, Rosalin Hawking, MD, 20 mg at 08/05/21 0902   labetalol (NORMODYNE) injection 10 mg, 10 mg, Intravenous, Q2H PRN, Charlean Merl, [place], NP   lisinopril (ZESTRIL) tablet 40 mg, 40 mg, Oral, Daily, Rosalin Hawking, MD, 40 mg at 08/05/21 0902   pantoprazole (PROTONIX) EC tablet 40 mg, 40 mg, Oral, Daily, Rosalin Hawking, MD, 40 mg at 08/05/21 0902   senna-docusate (Senokot-S) tablet 1 tablet, 1 tablet, Oral, QHS PRN, Amie Portland, MD   Patients Current Diet:  Diet Order                  Diet - low sodium heart healthy             Diet heart healthy/carb modified Room service appropriate? Yes; Fluid consistency: Thin  Diet effective now                         Precautions / Restrictions Precautions Precautions: Fall Precaution Comments: SBP<160 Restrictions Weight Bearing Restrictions: No    Has the patient had 2 or more falls or a fall with injury in the past year? No   Prior Activity Level Limited Community (1-2x/wk): gets out of hosue 3-4 days/week   Prior Functional Level Self Care: Did the patient need help bathing, dressing, using the toilet or eating? Independent   Indoor Mobility: Did the patient need assistance with walking from room to room (with or without device)? Independent   Stairs: Did the patient need assistance with internal or external stairs (with or without device)? Independent   Functional Cognition: Did the patient need help planning regular tasks such as shopping or remembering to take medications? Independent   Patient Information Are you of Hispanic, Latino/a,or Spanish origin?: A. No, not of Hispanic, Latino/a, or Spanish origin What is your race?: A. White Do you need or want an interpreter to communicate with a doctor or health care staff?: 0. No   Patient's Response To:  Health Literacy and Transportation Is the patient able  to respond to health literacy and transportation needs?: Yes Health Literacy - How often do you need to have someone help you when you read instructions, pamphlets, or other written material from your doctor or pharmacy?: Never In the past 12 months, has lack of transportation kept you from medical appointments or from getting medications?: No In the past 12 months, has lack of transportation kept you from meetings, work, or from getting things needed for daily living?: No   Development worker, international aid / Equipment  Home Equipment: BSC/3in1, Kasandra Knudsen - single point   Prior Device Use: Indicate devices/aids used by the patient prior to current illness, exacerbation or injury?  cane   Current Functional Level Cognition   Arousal/Alertness: Lethargic Overall Cognitive Status: Impaired/Different from baseline Current Attention Level: Sustained Orientation Level: Oriented to person, Oriented to time, Oriented to situation Following Commands: Follows one step commands inconsistently Safety/Judgement: Decreased awareness of safety, Decreased awareness of deficits General Comments: Lethargic and required mod-max cues for all sequencing and problem solving and had difficulty following commands. Pt was not attending to the L side well, however seems slightly improved from OT session earlier today. Attention: Sustained Sustained Attention: Appears intact Selective Attention: Appears intact Memory: Appears intact Awareness: Impaired Awareness Impairment: Emergent impairment Problem Solving: Impaired Problem Solving Impairment: Verbal complex, Functional complex Executive Function: Self Monitoring Self Monitoring: Impaired Self Monitoring Impairment: Functional complex    Extremity Assessment (includes Sensation/Coordination)   Upper Extremity Assessment: LUE deficits/detail LUE Deficits / Details: AROM is [facility] for hand wrist and elbow, limited shoulder ROM. Pt denies numbness/tingingly. She does not attend  well to LUE. She was able to sustain grip on RW, unable to use functionally for grooming at the sink. Globally 3-/5 MMT. Pt leaving L hand and wrist in unsafe positions LUE Sensation: decreased proprioception LUE Coordination: decreased fine motor, decreased gross motor  Lower Extremity Assessment: Defer to PT evaluation     ADLs   Overall ADL's : Needs assistance/impaired Grooming: Oral care, Brushing hair, Minimal assistance, Standing Grooming Details (indicate cue type and reason): min A for cues, packaging and management of LUE Upper Body Bathing: Minimal assistance, Sitting Lower Body Bathing: Moderate assistance, Sit to/from stand Upper Body Dressing : Minimal assistance, Sitting Lower Body Dressing: Moderate assistance, Sit to/from stand Toilet Transfer: Minimal assistance, Regular Toilet, Rolling walker (2 wheels), Cueing for safety, Cueing for sequencing Toilet Transfer Details (indicate cue type and reason): Min A for managment of RW and cues Toileting- Clothing Manipulation and Hygiene: Min guard, Sitting/lateral lean Toileting - Clothing Manipulation Details (indicate cue type and reason): required cue to located toilet paper to pt's L side Functional mobility during ADLs: Minimal assistance, Rolling walker (2 wheels) General ADL Comments: Pt limited by lethargic, cognition and inattention this session. Pt ran into all barriers in her R side adn required max cues to correct for safety. Cue f orproblem solving to complete ADLs wihtout use of LUE. & assist for LUE management and safe/supoprtive positioning     Mobility   Overal bed mobility: Needs Assistance Bed Mobility: Supine to Sit, Sit to Supine Supine to sit: Mod assist Sit to supine: Min guard General bed mobility comments: HOB flat and heavy use of rails. Pt reaching for therapist's hand to elevate trunk to full sitting position. Several rounds of VC's for pt to scoot around and get both feet on the floor.     Transfers    Overall transfer level: Needs assistance Equipment used: Rolling walker (2 wheels), None Transfers: Sit to/from Stand Sit to Stand: Min assist Bed to/from chair/wheelchair/BSC transfer type:: Step pivot Step pivot transfers: Min assist General transfer comment: Assist to power up to full stand, to gain/maintain standing balance, and to place L hand onto the walker (VC's to attend to the L side).     Ambulation / Gait / Stairs / Wheelchair Mobility   Ambulation/Gait Ambulation/Gait assistance: Herbalist (Feet): 25 Feet Assistive device: 1 person hand held assist, Rolling walker (2 wheels) Gait Pattern/deviations:  Step-through pattern, Decreased stride length General Gait Details: Pt insisting on using the RW for support however with difficulty keeping the L hand on the walker and pt with increased difficulty attending to the L side. Gait was improved with HHA, however pt continued to have difficulty attending to the L. Gait velocity: decreased     Posture / Balance Balance Overall balance assessment: Needs assistance Sitting-balance support: Feet supported Sitting balance-Leahy Scale: Fair Standing balance support: Single extremity supported, During functional activity Standing balance-Leahy Scale: Fair Standing balance comment: stood at the sink to groom     Special needs/care consideration NA    Previous Home Environment (from acute therapy documentation) Living Arrangements: Children  Lives With: Son, Family Available Help at Discharge: Family, Available 24 hours/day Type of Home: House Home Layout: Two level, Able to live on main level with bedroom/bathroom Home Access: Stairs to enter Entrance Stairs-Rails: Can reach both Entrance Stairs-Number of Steps: 4 Bathroom Shower/Tub: Multimedia programmer: Handicapped height Bathroom Accessibility: Yes How Accessible: Accessible via [place]: No   Discharge Living Setting Plans for  Discharge Living Setting: Patient's home Type of Home at Discharge: [place]: Two level, Able to live on main level with bedroom/bathroom Discharge Home Access: Stairs to enter Entrance Stairs-Rails: Can reach both Entrance Stairs-Number of Steps: 4 Discharge Bathroom Shower/Tub: Walk-in shower Discharge Bathroom Toilet: Handicapped height Discharge Bathroom Accessibility: Yes How Accessible: Accessible via walker Does the patient have any problems obtaining your medications?: No   Social/Family/Support Systems Patient Roles: Parent Anticipated Caregiver: Alejandria Wessells, son and other family Anticipated Caregiver's Contact Information: [phone] Caregiver Availability: 24/7 Discharge Plan Discussed with Primary Caregiver: Yes Is Caregiver In Agreement with Plan?: Yes Does Caregiver/Family have Issues with Lodging/Transportation while Pt is in Rehab?: No   Goals Patient/Family Goal for Rehab: Supervision: PT/OT/ST Expected length of stay: 7-10 days Pt/Family Agrees to Admission and willing to participate: Yes Program Orientation Provided & Reviewed with Pt/Caregiver Including Roles  & Responsibilities: Yes   Decrease burden of Care through IP rehab admission: NA   Possible need for SNF placement upon discharge: Not anticipated   Patient Condition: I have reviewed medical records from [facility], spoken with CM, and patient and son. I met with patient at the bedside and discussed via phone for inpatient rehabilitation assessment.  Patient will benefit from ongoing PT, OT, and SLP, can actively participate in 3 hours of therapy a day 5 days of the week, and can make measurable gains during the admission.  Patient will also benefit from the coordinated team approach during an Inpatient Acute Rehabilitation admission.  The patient will receive intensive therapy as well as Rehabilitation physician, nursing, social worker, and care management interventions.  Due to  safety, disease management, medication administration, and patient education the patient requires 24 hour a day rehabilitation nursing.  The patient is currently Min A with mobility and basic ADLs.  Discharge setting and therapy post discharge at home with home health is anticipated.  Patient has agreed to participate in the Acute Inpatient Rehabilitation Program and will admit today.   Preadmission Screen Completed By:  Bethel Born, 08/05/2021 4:16 PM ______________________________________________________________________   Discussed status with Dr. Ranell Patrick  on 08/07/21 at 32 and received approval for admission today.   Admission Coordinator:  Bethel Born, CCC-SLP,  with updates by Clemens Catholic, MS, CCC-SLP time 1340 /Date 08/07/21    Assessment/Plan: Diagnosis: CVA Does the need for close, 24  hr/day Medical supervision in concert with the patient's rehab needs make it unreasonable for this patient to be served in a less intensive setting? Yes Co-Morbidities requiring supervision/potential complications: morbid obesity, hypertension, GERD, cerebral edema, OSA Due to bladder management, bowel management, safety, skin/wound care, disease management, medication administration, pain management, and patient education, does the patient require 24 hr/day rehab nursing? Yes Does the patient require coordinated care of a physician, rehab nurse, PT, OT, and SLP to address physical and functional deficits in the context of the above medical diagnosis(es)? Yes Addressing deficits in the following areas: balance, endurance, locomotion, strength, transferring, bowel/bladder control, bathing, dressing, feeding, grooming, toileting, and cognition Can the patient actively participate in an intensive therapy program of at least 3 hrs of therapy 5 days a week? Yes The potential for patient to make measurable gains while on inpatient rehab is excellent Anticipated functional outcomes upon discharge  from inpatient rehab: modified independent PT, supervision OT, supervision SLP Estimated rehab length of stay to reach the above functional goals is: 5-7 days Anticipated discharge destination: Home 10. Overall Rehab/Functional Prognosis: excellent     MD Signature: Leeroy Cha, MD

## 2021-08-07 NOTE — Discharge Summary (Signed)
[facility]  Physician Discharge Summary   Patient ID: Alyssa Mayo MRN: [id number] DOB/AGE: September 11, 1945 76 y.o.  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   CODE STATUS: Full code  DISCHARGE CONDITION: fair  Diet recommendation: Modified carbohydrate  INITIAL HISTORY: 75 y.o. female with history of diabetes, hypertension, hyperlipidemia, obesity, and sleep apnea presenting to [facility] after being found down by her son. Her last know well was approximately 07/29/2021.  Evaluation revealed acute stroke.  Patient was transferred to [facility].   Consultants: Neurology   Procedures: Loop recorder placement.  Transthoracic echocardiogram.  Lower extremity Doppler study.    HOSPITAL COURSE:   Acute ischemic stroke [facility])- (present on admission) MRI revealed right MCA infarct.  Echocardiogram showed EF of 60 to 65%.  LDL 93.  HbA1c 7.4.  Lower extremity Dopplers negative for DVT.  Currently only on aspirin.  No dual therapy due to hemorrhagic transformation. Underwent loop recorder placement. Seen by PT and OT.  Inpatient rehabilitation is recommended.  Patient is on statin.   Cerebral edema (HCC)- (present on admission) Noted to have cerebral edema on imaging studies.  Was [facility] to [facility].  Placed on hypertonic saline while she was in the ICU.  Stable currently.   DM (diabetes mellitus), type 2 with neurological complications (HCC)- (present on admission) HbA1c 7.4.  Home medications include Farxiga, glipizide and metformin.  Currently on SSI.  CBGs  are reasonably well controlled.   Essential hypertension- (present on admission) Noted to have low blood pressures this morning.  We will reduce the dose of amlodipine and hold her ACE inhibitor for now.  Recommend checking labs in the next 24 to 48 hours including electrolytes   Hyperlipidemia- (present on admission) Continue statin.   OSA (obstructive sleep apnea)- (present on admission) CPAP.  Obesity Estimated body mass index is 36.49 kg/m as calculated from the following:   Height as of an earlier encounter on 08/01/21: 5\' 3"  (1.6 m).   Weight as of an earlier encounter on 08/01/21: 93.4 kg.   Patient is stable.  Okay for discharge to inpatient rehabilitation.  PERTINENT LABS:  The results of significant diagnostics from this hospitalization (including imaging, microbiology, ancillary and laboratory) are listed below for reference.    Microbiology: Recent Results (from the past 240 hour(s))  Resp Panel by RT-PCR (Flu A&B, Covid) Nasopharyngeal Swab     Status: None   Collection Time: 08/01/21 10:19 AM   Specimen: Nasopharyngeal Swab; Nasopharyngeal(NP) swabs in vial transport medium  Result Value Ref Range Status   SARS Coronavirus 2 by RT PCR NEGATIVE NEGATIVE Final    Comment: (NOTE) SARS-CoV-2 target nucleic acids are NOT DETECTED.  The SARS-CoV-2 RNA is generally detectable in upper respiratory specimens during the acute phase of infection. The lowest concentration of SARS-CoV-2 viral copies this assay can detect is 138 copies/mL. A negative result does not preclude SARS-Cov-2 infection and should not be used as the sole basis for treatment or other patient management decisions. A negative result may occur with  improper specimen collection/handling, submission of specimen other than nasopharyngeal swab, presence of viral mutation(s) within the areas targeted by this assay, and inadequate number of  viral copies(<138 copies/mL). A negative result must be combined  with clinical observations, patient history, and epidemiological information. The expected result is Negative.  Fact Sheet for Patients:  EntrepreneurPulse.com.au  Fact Sheet for Healthcare Providers:  IncredibleEmployment.be  This test is no t yet approved or cleared by the [place] FDA and  has been authorized for detection and/or diagnosis of SARS-CoV-2 by FDA under an Emergency Use Authorization (EUA). This EUA will remain  in effect (meaning this test can be used) for the duration of the COVID-19 declaration under Section 564(b)(1) of the Act, 21 U.S.C.section 360bbb-3(b)(1), unless the authorization is terminated  or revoked sooner.       Influenza A by PCR NEGATIVE NEGATIVE Final   Influenza B by PCR NEGATIVE NEGATIVE Final    Comment: (NOTE) The Xpert Xpress SARS-CoV-2/FLU/RSV plus assay is intended as an aid in the diagnosis of influenza from Nasopharyngeal swab specimens and should not be used as a sole basis for treatment. Nasal washings and aspirates are unacceptable for Xpert Xpress SARS-CoV-2/FLU/RSV testing.  Fact Sheet for Patients: EntrepreneurPulse.com.au  Fact Sheet for Healthcare Providers: IncredibleEmployment.be  This test is not yet approved or cleared by the [place] FDA and has been authorized for detection and/or diagnosis of SARS-CoV-2 by FDA under an Emergency Use Authorization (EUA). This EUA will remain in effect (meaning this test can be used) for the duration of the COVID-19 declaration under Section 564(b)(1) of the Act, 21 U.S.C. section 360bbb-3(b)(1), unless the authorization is terminated or revoked.  Performed at [facility], [address] [place], [place] [address]      Labs:  COVID-19 Labs   Lab Results  Component Value Date   [place] 08/01/2021   [place] NEGATIVE 07/23/2021   [place] NEGATIVE 02/01/2019       Basic Metabolic Panel: Recent Labs  Lab 08/01/21 1019 08/01/21 1458 08/01/21 2315 08/02/21 1151  NA 137 135 145 141  K 4.2  --   --  4.3  CL 101  --   --  111  CO2 24  --   --  17*  GLUCOSE 113*  --   --  98  BUN 31*  --   --  26*  CREATININE 1.17*  --   --  1.05*  CALCIUM 9.5  --   --  8.9   Liver Function Tests: Recent Labs  Lab 08/01/21 1019  AST 14*  ALT 16  ALKPHOS 124  BILITOT 0.7  PROT 7.9  ALBUMIN 3.8    CBC: Recent Labs  Lab 08/01/21 1019 08/02/21 1151  WBC 14.0* 13.9*  NEUTROABS 11.0*  --   HGB 14.1 13.8  HCT 44.2 44.8  MCV 89.1 90.9  PLT 308 312     CBG: Recent Labs  Lab 08/06/21 0616 08/06/21 1149 08/06/21 2113 08/07/21 0611 08/07/21 1125  GLUCAP 136* 133* 134* 136* 184*     IMAGING STUDIES CT ANGIO HEAD NECK W WO CM  Result Date: 08/01/2021 CLINICAL DATA:  Follow-up large right MCA stroke with hemorrhagic conversion EXAM: CT ANGIOGRAPHY HEAD AND NECK TECHNIQUE: Multidetector CT imaging of the head and neck was performed using the standard protocol during bolus administration of intravenous contrast. Multiplanar CT image reconstructions and MIPs were obtained to evaluate the vascular anatomy. Carotid stenosis measurements (when applicable) are obtained utilizing NASCET criteria, using the distal internal carotid diameter as the denominator. RADIATION DOSE REDUCTION: This exam was performed according to the departmental dose-optimization program which includes automated exposure control,  adjustment of the mA and/or kV according to patient size and/or use of iterative reconstruction technique. CONTRAST:  47mL OMNIPAQUE IOHEXOL 350 MG/ML SOLN COMPARISON:  08/01/2021 CT head and MRI head FINDINGS: CT HEAD FINDINGS For noncontrast findings, please see same day CT head. CTA NECK FINDINGS Aortic arch: Two-vessel arch with a common origin of the brachiocephalic and left common carotid arteries. Imaged portion shows no evidence of aneurysm or  dissection. No significant stenosis of the major arch vessel origins. Aortic atherosclerosis. Right carotid system: No evidence of dissection, stenosis (50% or greater) or occlusion. Retropharyngeal course of the common and internal carotid arteries. Left carotid system: No evidence of dissection, stenosis (50% or greater) or occlusion. Retropharyngeal course of the common and internal carotid arteries. Vertebral arteries: No evidence of dissection, stenosis (50% or greater) or occlusion. Skeleton: Degenerative changes with straightening and mild reversal of the normal cervical lordosis, with degenerative osseous fusion of C4-C6. No acute osseous abnormality. Other neck: Negative. Upper chest: No focal pulmonary opacity or pleural effusion. Review of the MIP images confirms the above findings CTA HEAD FINDINGS Evaluation is somewhat limited by degree of venous contamination. Anterior circulation: Both internal carotid arteries are patent to the termini, without stenosis or other abnormality. A1 segments patent. Normal anterior communicating artery. Anterior cerebral arteries are patent to their distal aspects. No M1 stenosis or occlusion. At the right MCA bifurcation, there is a nonocclusive filling defect (series 9, image 92-93), with irregular and diminished flow in the inferior division and its branches. Patent superior division. Normal left MCA bifurcation. Distal left MCA branches perfused. Posterior circulation: Vertebral arteries patent to the vertebrobasilar junction without stenosis. Basilar patent to its distal aspect. Small fenestration proximally. Superior cerebellar arteries patent bilaterally. Bilateral P1 segments patent. Bilateral posterior communicating arteries patent. PCAs perfused to their distal aspects without stenosis. Venous sinuses: As permitted by contrast timing, patent. Anatomic variants: None significant Review of the MIP images confirms the above findings IMPRESSION: 1. Nonocclusive  filling defect in the right MCA bifurcation, with irregular and diminished flow in the inferior M2. 2. No other significant stenosis or large vessel occlusion. 3.  No hemodynamically significant stenosis in the neck. Electronically Signed   By: Merilyn Baba M.D.   On: 08/01/2021 19:40   DG Chest 2 View  Result Date: 07/23/2021 CLINICAL DATA:  Short of breath, cough for 5-6 days EXAM: CHEST - 2 VIEW COMPARISON:  None. FINDINGS: Frontal and lateral views of the chest demonstrate an enlarged cardiac silhouette. There is patchy consolidation within the right infrahilar region concerning for pneumonia. No effusion or pneumothorax. No acute bony abnormalities. IMPRESSION: 1. Right infrahilar airspace disease, likely in the right middle lobe, compatible with pneumonia. 2. Enlarged cardiac silhouette. Electronically Signed   By: Randa Ngo M.D.   On: 07/23/2021 17:47   CT HEAD WO CONTRAST (5MM)  Result Date: 08/03/2021 CLINICAL DATA:  Stroke follow-up EXAM: CT HEAD WITHOUT CONTRAST TECHNIQUE: Contiguous axial images were obtained from the base of the skull through the vertex without intravenous contrast. RADIATION DOSE REDUCTION: This exam was performed according to the departmental dose-optimization program which includes automated exposure control, adjustment of the mA and/or kV according to patient size and/or use of iterative reconstruction technique. COMPARISON:  Yesterday FINDINGS: Brain: Right MCA territory infarction which is most confluent at the insula and operculum with petechial hemorrhage. No evidence of progression. No worrisome mass effect, collection, or hydrocephalus. Vascular: No hyperdense vessel or unexpected calcification. Skull: Normal. Negative for fracture or  focal lesion. Sinuses/Orbits: Bilateral cataract resection IMPRESSION: No interval progression of the recent right MCA infarct with petechial hemorrhage. Electronically Signed   By: Jorje Guild M.D.   On: 08/03/2021 04:10   CT  HEAD WO CONTRAST  Result Date: 08/02/2021 CLINICAL DATA:  Stroke follow-up EXAM: CT HEAD WITHOUT CONTRAST TECHNIQUE: Contiguous axial images were obtained from the base of the skull through the vertex without intravenous contrast. RADIATION DOSE REDUCTION: This exam was performed according to the departmental dose-optimization program which includes automated exposure control, adjustment of the mA and/or kV according to patient size and/or use of iterative reconstruction technique. COMPARISON:  Brain MRI from yesterday FINDINGS: Brain: Cytotoxic edema from recent infarct in the right MCA territory, unchanged in extent when compared to prior brain MRI, specifically involving the lateral frontal lobe, insula, and patchy along the high frontal parietal convexity. Basal ganglia involvement at the putamen and caudate. Petechial hemorrhage centered on the frontal operculum and insula. Local mass effect is unchanged. No progressive finding or hydrocephalus. Vascular: No hyperdense vessel or unexpected calcification. Skull: Normal. Negative for fracture or focal lesion. Sinuses/Orbits: No acute finding. IMPRESSION: Unchanged right MCA territory infarct with petechial hemorrhage. Electronically Signed   By: Jorje Guild M.D.   On: 08/02/2021 04:22   CT HEAD WO CONTRAST  Result Date: 08/01/2021 CLINICAL DATA:  Neuro deficit, stroke suspected, left-sided weakness and slurred speech EXAM: CT HEAD WITHOUT CONTRAST TECHNIQUE: Contiguous axial images were obtained from the base of the skull through the vertex without intravenous contrast. RADIATION DOSE REDUCTION: This exam was performed according to the departmental dose-optimization program which includes automated exposure control, adjustment of the mA and/or kV according to patient size and/or use of iterative reconstruction technique. COMPARISON:  06/26/2014 head CT, correlation is also made with 05/21/2019 MRI head FINDINGS: Brain: Mass or masses in the inferior  right frontal lobe, measuring approximately 3.7 x 1.7 x 3.3 cm (series 3, image 13 and series 5, image 27), with surrounding hypodensity, most likely edema, which extends into the right basal ganglia and causes sulcal crowding in the right frontal lobe and mild narrowing of the right lateral ventricle. Approximately 3 mm of right-to-left midline shift at the level of the basal ganglia. Additional hypodensity more superiorly in the anterior right frontal lobe (series 3, image 22) and in the right frontoparietal region (series 3, image 22), new from the prior exam may be related to the mass or represent age-indeterminate infarcts. No acute hemorrhage.  No hydrocephalus or extra-axial collection. Vascular: No hyperdense vessel. Skull: No acute osseous abnormality. Sinuses/Orbits: Mucosal thickening in the ethmoid air cells. Status post right lens replacement. Other: Trace fluid in the bilateral mastoid air cells. IMPRESSION: 1. Mass in the inferior right frontal lobe, with surrounding edema, with local mass effect in the right frontal lobe and on the right lateral ventricle, with approximately 3 mm of right-to-left midline shift. An MRI with and without contrast is recommended for further evaluation. 2. Additional hypodensity in the anterior right frontal lobe and right frontal parietal region are new from the prior exam and are technically age indeterminate but may indicate subacute infarcts. These results were called by telephone at the time of interpretation on 08/01/2021 at 11:20 am to provider Merlyn Lot , who verbally acknowledged these results. Electronically Signed   By: Merilyn Baba M.D.   On: 08/01/2021 11:20   MR Brain W and Wo Contrast  Result Date: 08/01/2021 CLINICAL DATA:  Left-sided weakness, slurred speech EXAM: MRI HEAD WITHOUT  AND WITH CONTRAST TECHNIQUE: Multiplanar, multiecho pulse sequences of the brain and surrounding structures were obtained without and with intravenous contrast.  CONTRAST:  46mL GADAVIST GADOBUTROL 1 MMOL/ML IV SOLN COMPARISON:  Same-day noncontrast head CT FINDINGS: Brain: There is extensive diffusion restriction throughout much of the MCA territory including the insula, anterior frontal lobe, lentiform nucleus, caudate body, extending into the high right frontal and parietal lobe cortices. There is associated FLAIR signal abnormality and edema consistent with subacute infarcts. There is extensive SWI signal dropout consistent with hemorrhagic transformation. There is no enhancement to suggest solid underlying mass lesion. The above findings result in effacement of the sulci particularly in the region of the right frontal operculum and partial effacement of the right lateral ventricle. There is no midline shift. The remainder of the parenchyma is normal in appearance. There is no abnormal enhancement. Vascular: Normal flow voids. Skull and upper cervical spine: Normal marrow signal. Sinuses/Orbits: There is mild mucosal thickening in the paranasal sinuses. Bilateral lens implants are in place. The globes and orbits are otherwise unremarkable. Other: There are small bilateral mastoid effusions. IMPRESSION: 1. Findings above consistent with early subacute infarcts in the right MCA distribution with involvement of the insula, lentiform and caudate nuclei, frontal, and parietal lobes with evidence of hemorrhagic transformation. Associated edema results in mild regional mass effect without midline shift. 2. No abnormal enhancement to suggest underlying mass lesion. Critical Value/emergent results were called by telephone at the time of interpretation on 08/01/2021 at 2:00 pm to provider Dr Tamala Julian, who verbally acknowledged these results. Electronically Signed   By: Valetta Mole M.D.   On: 08/01/2021 14:04   EP PPM/ICD IMPLANT  Result Date: 08/05/2021 CONCLUSIONS:  1. Successful implantation of a [employer] Reveal LINQ implantable loop recorder for cryptogenic stroke  2. No early  apparent complications. Cristopher Peru, MD 08/05/2021   DG Chest Port 1 View  Result Date: 08/02/2021 CLINICAL DATA:  Leukocytosis EXAM: PORTABLE CHEST 1 VIEW COMPARISON:  08/01/2021 FINDINGS: Stable cardiomegaly. Slightly low lung volumes. Streaky bibasilar opacities. No large pleural fluid collection. No pneumothorax. IMPRESSION: Low lung volumes with streaky bibasilar opacities, likely atelectasis. Electronically Signed   By: Davina Poke D.O.   On: 08/02/2021 08:49   DG Chest Port 1 View  Result Date: 08/01/2021 CLINICAL DATA:  Best obtainable images. Unable to locate bra, patient would not stop rolling onto left side. Stroke, cerebrum, left sided weakness and slurred speech. Per son, symptoms started Tuesday. EXAM: PORTABLE CHEST 1 VIEW COMPARISON:  07/23/2021 FINDINGS: Stable large cardiac silhouette. Low lung volumes. LEFT base poorly evaluated. No pulmonary edema. No pneumothorax. IMPRESSION: Cardiomegaly and LEFT basilar atelectasis. Electronically Signed   By: Suzy Bouchard M.D.   On: 08/01/2021 17:02   ECHOCARDIOGRAM COMPLETE  Result Date: 08/02/2021    ECHOCARDIOGRAM REPORT   Patient Name:   ROSELLE CUCINELLA Date of Exam: 08/02/2021 Medical Rec #:  [id number]          Height:       63.0 in Accession #:    [id number]         Weight:       206.0 lb Date of Birth:  1946-05-02           BSA:          1.958 m Patient Age:    23 years           BP:           131/51 mmHg Patient Gender: F  HR:           96 bpm. Exam Location:  Inpatient Procedure: 2D Echo Indications:     Stroke  History:         Patient has no prior history of Echocardiogram examinations.                  Risk Factors:Hypertension and Diabetes.  Sonographer:     Jefferey Pica Referring Phys:  [id number] Diagnosing Phys: Gwyndolyn Kaufman MD  Sonographer Comments: Technically difficult study due to poor echo windows. Image acquisition challenging due to patient body habitus and Image acquisition  challenging due to uncooperative patient. Contrast was not used due to patients poor temperment and her request to end the exam. IMPRESSIONS  1. Technically difficult study with poor echo windows. Patient declined Definity contrast.  2. Left ventricular ejection fraction, by estimation, is 55 to 60%. The left ventricle has normal function. Left ventricular endocardial border not optimally defined to evaluate regional wall motion. Left ventricular diastolic parameters are consistent with Grade I diastolic dysfunction (impaired relaxation).  3. Right ventricular systolic function is normal. The right ventricular size is normal. The RV wall appears thickened, however, this may be due to a prominent fat pad.  4. The mitral valve is normal in structure. Trivial mitral valve regurgitation.  5. The aortic valve is tricuspid. There is mild calcification of the aortic valve. There is mild thickening of the aortic valve. Aortic valve regurgitation is not visualized. Aortic valve sclerosis/calcification is present, without any evidence of aortic stenosis.  6. The inferior vena cava is normal in size with greater than 50% respiratory variability, suggesting right atrial pressure of 3 mmHg. Conclusion(s)/Recommendation(s): No intracardiac source of embolism detected on this transthoracic study. Consider a transesophageal echocardiogram to exclude cardiac source of embolism if clinically indicated. FINDINGS  Left Ventricle: Left ventricular ejection fraction, by estimation, is 55 to 60%. The left ventricle has normal function. Left ventricular endocardial border not optimally defined to evaluate regional wall motion. The left ventricular internal cavity size was normal in size. There is no left ventricular hypertrophy. Left ventricular diastolic parameters are consistent with Grade I diastolic dysfunction (impaired relaxation). Right Ventricle: The right ventricular size is normal. Right vetricular wall thickness was not well  visualized. Right ventricular systolic function is normal. Left Atrium: Left atrial size was not well visualized. Right Atrium: Right atrial size was not well visualized. Pericardium: Trivial pericardial effusion is present. Presence of epicardial fat layer. Mitral Valve: The mitral valve is normal in structure. Trivial mitral valve regurgitation. Tricuspid Valve: The tricuspid valve is normal in structure. Tricuspid valve regurgitation is trivial. Aortic Valve: The aortic valve is tricuspid. There is mild calcification of the aortic valve. There is mild thickening of the aortic valve. Aortic valve regurgitation is not visualized. Aortic valve sclerosis/calcification is present, without any evidence of aortic stenosis. Aortic valve peak gradient measures 23.4 mmHg. Pulmonic Valve: The pulmonic valve was normal in structure. Pulmonic valve regurgitation is trivial. Aorta: The aortic root and ascending aorta are structurally normal, with no evidence of dilitation. Venous: The inferior vena cava is normal in size with greater than 50% respiratory variability, suggesting right atrial pressure of 3 mmHg. IAS/Shunts: The atrial septum is grossly normal.  LEFT VENTRICLE PLAX 2D LVIDd:         5.10 cm   Diastology LVIDs:         4.10 cm   LV e' medial:    6.16 cm/s LV  PW:         1.10 cm   LV E/e' medial:  16.9 LV IVS:        1.00 cm   LV e' lateral:   7.87 cm/s LVOT diam:     2.00 cm   LV E/e' lateral: 13.2 LV SV:         100 LV SV Index:   51 LVOT Area:     3.14 cm  IVC IVC diam: 1.70 cm LEFT ATRIUM           Index        RIGHT ATRIUM           Index LA Vol (A2C): 63.7 ml 32.53 ml/m  RA Area:     11.70 cm LA Vol (A4C): 38.6 ml 19.71 ml/m  RA Volume:   19.90 ml  10.16 ml/m  AORTIC VALVE                  PULMONIC VALVE AV Area (Vmax): 2.03 cm      PV Vmax:       0.89 m/s AV Vmax:        242.00 cm/s   PV Peak grad:  3.1 mmHg AV Peak Grad:   23.4 mmHg LVOT Vmax:      156.00 cm/s LVOT Vmean:     107.000 cm/s LVOT VTI:        0.319 m  AORTA Ao Asc diam: 3.10 cm MITRAL VALVE MV Area (PHT): 3.79 cm     SHUNTS MV Decel Time: 200 msec     Systemic VTI:  0.32 m MV E velocity: 104.00 cm/s  Systemic Diam: 2.00 cm MV A velocity: 111.00 cm/s MV E/A ratio:  0.94 Gwyndolyn Kaufman MD Electronically signed by Gwyndolyn Kaufman MD Signature Date/Time: 08/02/2021/1:38:41 PM    Final (Updated)    VAS [place] LOWER EXTREMITY VENOUS (DVT)  Result Date: 08/02/2021  Lower Venous DVT Study Patient Name:  Charnice Sandefur  Date of Exam:   08/02/2021 Medical Rec #: [id number]           Accession #:    [id number] Date of Birth:                     Patient Gender: Patient Age: Exam Location:  [facility] Procedure:      VAS [place] LOWER EXTREMITY VENOUS (DVT) Referring Phys: --------------------------------------------------------------------------------  Indications: Stroke.  Comparison Study: no prior Performing Technologist: Archie Patten RVS  Examination Guidelines: A complete evaluation includes B-mode imaging, spectral Doppler, color Doppler, and power Doppler as needed of all accessible portions of each vessel. Bilateral testing is considered an integral part of a complete examination. Limited examinations for reoccurring indications may be performed as noted. The reflux portion of the exam is performed with the patient in reverse Trendelenburg.  +---------+---------------+---------+-----------+----------+--------------+  RIGHT     Compressibility Phasicity Spontaneity Properties Thrombus Aging  +---------+---------------+---------+-----------+----------+--------------+  CFV       Full            Yes       Yes                                    +---------+---------------+---------+-----------+----------+--------------+  SFJ       Full                                                             +---------+---------------+---------+-----------+----------+--------------+  FV Prox   Full                                                              +---------+---------------+---------+-----------+----------+--------------+  FV Mid    Full                                                             +---------+---------------+---------+-----------+----------+--------------+  FV Distal Full                                                             +---------+---------------+---------+-----------+----------+--------------+  PFV       Full                                                             +---------+---------------+---------+-----------+----------+--------------+  POP       Full            Yes       Yes                                    +---------+---------------+---------+-----------+----------+--------------+  PTV       Full                                                             +---------+---------------+---------+-----------+----------+--------------+  PERO      Full                                                             +---------+---------------+---------+-----------+----------+--------------+   +---------+---------------+---------+-----------+----------+--------------+  LEFT      Compressibility Phasicity Spontaneity Properties Thrombus Aging  +---------+---------------+---------+-----------+----------+--------------+  CFV       Full            Yes       Yes                                    +---------+---------------+---------+-----------+----------+--------------+  SFJ       Full                                                             +---------+---------------+---------+-----------+----------+--------------+  FV Prox   Full                                                             +---------+---------------+---------+-----------+----------+--------------+  FV Mid    Full                                                             +---------+---------------+---------+-----------+----------+--------------+  FV Distal Full                                                              +---------+---------------+---------+-----------+----------+--------------+  PFV       Full                                                             +---------+---------------+---------+-----------+----------+--------------+  POP       Full            Yes       Yes                                    +---------+---------------+---------+-----------+----------+--------------+  PTV       Full                                                             +---------+---------------+---------+-----------+----------+--------------+  PERO      Full                                                             +---------+---------------+---------+-----------+----------+--------------+     Summary: BILATERAL: - No evidence of deep vein thrombosis seen in the lower extremities, bilaterally. -No evidence of popliteal cyst, bilaterally.   *See table(s) above for measurements and observations. Electronically signed by Jamelle Haring on 08/02/2021 at 2:55:16 PM.    Final     DISCHARGE EXAMINATION: See progress note from earlier today for details.   DISPOSITION: Inpatient rehab  Discharge Instructions     Diet - low sodium heart healthy   Complete by: As directed    Discharge instructions   Complete by: As directed    Follow-up with neurology in 2 to 4 weeks Follow-up primary care physician in 1 week   Increase activity slowly   Complete by: As directed  Current Inpatient Medications: Scheduled:  [START ON 08/08/2021] amLODipine  5 mg Oral Daily   aspirin EC  81 mg Oral Daily   atorvastatin  20 mg Oral Daily   pantoprazole  40 mg Oral Daily   Continuous: [id number] **OR** acetaminophen (TYLENOL) oral liquid 160 mg/5 mL **OR** acetaminophen, albuterol, ondansetron (ZOFRAN) IV, senna-docusate      TOTAL DISCHARGE TIME: 35 minutes  Emad Brechtel [employer] on [email or web address]  08/07/2021, 1:30 PM

## 2021-08-07 NOTE — Progress Notes (Signed)
INPATIENT REHABILITATION ADMISSION NOTE   Arrival Method:     Mental Orientation: A&O-4   Assessment:Done   Skin: None   IV'S: Left FA 20g Saline locked   Pain: None   Tubes and Drains: None   Safety Measures: Fall risk   Vital Signs: BP-118/53 Temp-98.6 Pulse-75 Resp-18   Rehab Orientation: Oriented to rehab. Aware of policies and procedures.   Family: Pt notified family    Musette Kisamore B Jerren Flinchbaugh

## 2021-08-07 NOTE — Hospital Course (Signed)
76 y.o. female with history of diabetes, hypertension, hyperlipidemia, obesity, and sleep apnea presenting to [facility] after being found down by her son. Her last know well was approximately 07/29/2021.  Evaluation revealed acute stroke.  Patient was transferred to [facility].

## 2021-08-07 NOTE — Assessment & Plan Note (Signed)
Noted to have low blood pressures this morning.  We will reduce the dose of amlodipine and hold her ACE inhibitor for now.  Check labs in the morning.  Asymptomatic.

## 2021-08-07 NOTE — Progress Notes (Signed)
Occupational Therapy Treatment Patient Details Name: Alyssa Mayo MRN: [id number] DOB: 01/21/1946 Today's Date: 08/07/2021   History of present illness 76 y/o female presented to ED on 08/01/21 after being found down with L sided weakness and slurred speech. 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 CTA showed nonocclusive filling defect in R MCA bifurcation and irregular and diminished flow in inferior division of R M2. PMH: HTN, obesity, macular degeneration, diabetes, sleep apnea   OT comments  Pt making steady progress towards OT goals this session. Session focus on various therapeutic activities focused on bilateral integration, LUE attention and improved LUE GM and FMC. Pt greeted supine in bed reporting she had already been OOB to walk. Pt worked on opening ADL items using LUE as gross assist as well as functional grasp and release with LUE. Pts LUE is overall WFL for ROM ( see UE assessment section), with pt limited mostly with FMC in L hand. Education provided on optimal positioning for LUE as well as working on AROM with LUE during downtime. Pt verbalizes understanding of all education. Pt would continue to benefit from skilled occupational therapy while admitted and after d/c to address the below listed limitations in order to improve overall functional mobility and facilitate independence with BADL participation. DC plan remains appropriate, will follow acutely per POC.      Recommendations for follow up therapy are one component of a multi-disciplinary discharge planning process, led by the attending physician.  Recommendations may be updated based on patient status, additional functional criteria and insurance authorization.    Follow Up Recommendations  Acute inpatient rehab (3hours/day)    Assistance Recommended at Discharge Intermittent Supervision/Assistance  Patient can return home with the following  A little help  with walking and/or transfers;A little help with bathing/dressing/bathroom;Direct supervision/assist for medications management;Assist for transportation;Help with stairs or ramp for entrance;Assistance with cooking/housework   Equipment Recommendations  Tub/shower seat    Recommendations for Other Services      Precautions / Restrictions Precautions Precautions: Fall Precaution Comments: SBP<160 Restrictions Weight Bearing Restrictions: No       Mobility Bed Mobility               General bed mobility comments: session conducted from chair position in bed    Transfers                         Balance                                           ADL either performed or assessed with clinical judgement   ADL       Grooming: Wash/dry face;Bed level;Set up Grooming Details (indicate cue type and reason): to wash face with LUE                               General ADL Comments: session focus on LUE attention and coordination to facilitate improved ADL independence    Extremity/Trunk Assessment Upper Extremity Assessment Upper Extremity Assessment: LUE deficits/detail LUE Deficits / Details: impaired overall proprioception of LUE during tasks. grip strength 2+/5, elbow flexion/extension 3-/5, FF 3-/5. pt completed therapeutic activity of grasp and releasing of ADL items in L hand with pt noted to drop items or lose  grasp on items during task. pt able to pass items from R<>L hand with MIN cues for L hand attention LUE Sensation: decreased proprioception LUE Coordination: decreased fine motor;decreased gross motor   Lower Extremity Assessment Lower Extremity Assessment: Defer to PT evaluation   Cervical / Trunk Assessment Cervical / Trunk Assessment: Normal    Vision Baseline Vision/History: 1 Wears glasses Patient Visual Report: No change from baseline Additional Comments: L inattention   Perception Perception Perception:  Impaired Inattention/Neglect: Does not attend to left visual field;Does not attend to left side of body   Praxis Praxis Praxis: Impaired Praxis Impairment Details: Motor planning Praxis-Other Comments: impaired GM movement in L UE    Cognition Arousal/Alertness: Awake/alert, Lethargic (initially lethargic but more alert as session progressed) Behavior During Therapy: Flat affect Overall Cognitive Status: Impaired/Different from baseline Area of Impairment: Attention, Following commands, Safety/judgement, Awareness, Problem solving, Memory                   Current Attention Level: Sustained Memory: Decreased recall of precautions, Decreased short-term memory Following Commands: Follows one step commands inconsistently Safety/Judgement: Decreased awareness of safety, Decreased awareness of deficits Awareness: Emergent Problem Solving: Slow processing, Difficulty sequencing, Requires verbal cues General Comments: pt continues to present with L inattention even with OTA standing on pts L side during session. pt with decreased carryover of previous education in relation to L sided inattention.        Exercises General Exercises - Upper Extremity Shoulder Flexion: AAROM, Left, Supine, Limitations Shoulder Flexion Limitations: to ~ 70* Elbow Flexion: AAROM, Left, Supine, 10 reps Elbow Extension: AAROM, Left, 10 reps, Supine Wrist Flexion: Left, 10 reps, Supine, AAROM Wrist Extension: Left, 10 reps, Supine, AAROM Digit Composite Flexion: AAROM, Left, 10 reps, Supine Composite Extension: AAROM, Left, 10 reps, Supine Other Exercises Other Exercises: scpaular protraction<>retraction with AAROM LUE from supine Other Exercises: opening ADL items to promote improved FMC in LUE Other Exercises: passing ADL items from R and L hand to promote bilateral integration    Shoulder Instructions       General Comments VSS on RA, no family present    Pertinent Vitals/ Pain       Pain  Assessment Pain Assessment: No/denies pain  Home Living                                          Prior Functioning/Environment              Frequency  Min 2X/week        Progress Toward Goals  OT Goals(current goals can now be found in the care plan section)  Progress towards OT goals: Progressing toward goals  Acute Rehab OT Goals Patient Stated Goal: to take a shower OT Goal Formulation: With patient Time For Goal Achievement: 08/16/21 Potential to Achieve Goals: Good  Plan Discharge plan remains appropriate;Frequency remains appropriate    Co-evaluation                 AM-PAC OT "6 Clicks" Daily Activity     Outcome Measure   Help from another person eating meals?: A Little Help from another person taking care of personal grooming?: A Little Help from another person toileting, which includes using toliet, bedpan, or urinal?: A Little Help from another person bathing (including washing, rinsing, drying)?: A Lot Help from another person to put on and taking off  regular upper body clothing?: A Lot Help from another person to put on and taking off regular lower body clothing?: A Lot 6 Click Score: 15    End of Session    OT Visit Diagnosis: Unsteadiness on feet (R26.81);Muscle weakness (generalized) (M62.81);Ataxia, unspecified (R27.0);Hemiplegia and hemiparesis Hemiplegia - dominant/non-dominant: Non-Dominant Hemiplegia - caused by: Cerebral infarction   Activity Tolerance Patient tolerated treatment well   Patient Left in bed;with call bell/phone within reach;with bed alarm set   Nurse Communication Mobility status        Time: 1110-1120 OT Time Calculation (min): 10 min  Charges: OT General Charges $OT Visit: 1 Visit OT Treatments $Therapeutic Activity: 8-22 mins  Lenor Derrick., COTA/L Acute Rehabilitation Services [phone]   Lashaunda Schild 08/07/2021, 12:13 PM

## 2021-08-07 NOTE — Assessment & Plan Note (Signed)
Noted to have cerebral edema on imaging studies.  Was transferred to [facility].  Placed on hypertonic saline.  Stable currently.

## 2021-08-07 NOTE — TOC Transition Note (Signed)
Transition of Care [facility]) - CM/SW Discharge Note   Patient Details  Name: Alyssa Mayo MRN: [id number] Date of Birth: 10-Jun-1946  [facility]) CM/SW Contact:  Kermit Balo, RN Phone Number: 08/07/2021, 1:57 PM   Clinical Narrative:    Patient is discharging to CIR today. CM signing off.    Final next level of care: IP Rehab Facility Barriers to Discharge: No Barriers Identified   Patient Goals and CMS Choice     Choice offered to / list presented to : Patient  Discharge Placement                       Discharge Plan and Services                                     Social Determinants of Health (SDOH) Interventions     Readmission Risk Interventions No flowsheet data found.

## 2021-08-07 NOTE — Assessment & Plan Note (Addendum)
MRI revealed right MCA infarct.  Echocardiogram showed EF of 60 to 65%.  LDL 93.  HbA1c 7.4.  Lower extremity Dopplers negative for DVT.  Currently only on aspirin.  No dual therapy due to hemorrhagic transformation. Underwent loop recorder placement. Seen by PT and OT.  Inpatient rehabilitation is recommended.  Patient is on statin.

## 2021-08-07 NOTE — Discharge Instructions (Addendum)
Inpatient Rehab Discharge Instructions  Alyssa Mayo Discharge date and time: No discharge date for patient encounter.   Activities/Precautions/ Functional Status: Activity: activity as tolerated Diet: diabetic diet Wound Care: Routine skin checks Functional status:  ___ No restrictions     ___ Walk up steps independently ___ 24/7 supervision/assistance   ___ Walk up steps with assistance ___ Intermittent supervision/assistance  ___ Bathe/dress independently ___ Walk with walker     _x__ Bathe/dress with assistance ___ Walk Independently    ___ Shower independently ___ Walk with assistance    ___ Shower with assistance ___ No alcohol     ___ Return to work/school ________  Special Instructions:  NO Driving smoking or alcohol    COMMUNITY REFERRALS UPON DISCHARGE:    Home Health:   PT  OT   [place]    Phone: [phone]   Medical Equipment/Items Ordered: HAS CANE AND NO OTHER NEEDS                                                 Agency/Supplier:NA    My questions have been answered and I understand these instructions. I will adhere to these goals and the provided educational materials after my discharge from the hospital.  Patient/Caregiver Signature _______________________________ Date __________  Clinician Signature _______________________________________ Date __________  Please bring this form and your medication list with you to all your follow-up doctor's appointments.  STROKE/TIA DISCHARGE INSTRUCTIONS SMOKING Cigarette smoking nearly doubles your risk of having a stroke & is the single most alterable risk factor  If you smoke or have smoked in the last 12 months, you are advised to quit smoking for your health. Most of the excess cardiovascular risk related to smoking disappears within a year of stopping. Ask you doctor about anti-smoking medications [place] Quit Line: 1-800-QUIT NOW Free Smoking Cessation Classes [phone]  [phone]  CHOLESTEROL Know your levels; limit fat & cholesterol in your diet  Lipid Panel     Component Value Date/Time   CHOL 182 08/02/2021 0341   CHOL 162 11/23/2019 1127   CHOL 178 06/29/2018 0953   TRIG 243 (H) 08/03/2021 0542   TRIG 141 06/29/2018 0953   HDL 31 (L) 08/02/2021 0341   HDL 42 11/23/2019 1127   CHOLHDL 5.9 08/02/2021 0341   VLDL 58 (H) 08/02/2021 0341   VLDL 28 06/29/2018 0953   LDLCALC 93 08/02/2021 0341   LDLCALC 78 11/23/2019 1127     Many patients benefit from treatment even if their cholesterol is at goal. Goal: Total Cholesterol (CHOL) less than 160 Goal:  Triglycerides (TRIG) less than 150 Goal:  HDL greater than 40 Goal:  LDL (LDLCALC) less than 100   BLOOD PRESSURE American Stroke Association blood pressure target is less that 120/80 mm/Hg  Your discharge blood pressure is:    Monitor your blood pressure Limit your salt and alcohol intake Many individuals will require more than one medication for high blood pressure  DIABETES (A1c is a blood sugar average for last 3 months) Goal HGBA1c is under 7% (HBGA1c is blood sugar average for last 3 months)  Diabetes:   Lab Results  Component Value Date   HGBA1C 7.4 (H) 08/02/2021    Your HGBA1c can be lowered with medications,  healthy diet, and exercise. Check your blood sugar as directed by your physician Call your physician if you experience unexplained or low blood sugars.  PHYSICAL ACTIVITY/REHABILITATION Goal is 30 minutes at least 4 days per week  Activity: Increase activity slowly, Therapies: Physical Therapy: Home Health Return to work:  Activity decreases your risk of heart attack and stroke and makes your heart stronger.  It helps control your weight and blood pressure; helps you relax and can improve your mood. Participate in a regular exercise program. Talk with your doctor about the best form of exercise for you (dancing, walking, swimming, cycling).  DIET/WEIGHT Goal is to maintain a  healthy weight  Your discharge diet is:  Diet Order     None       liquids Your height is:    Your current weight is:   Your Body Mass Index (BMI) is:    Following the type of diet specifically designed for you will help prevent another stroke. Your goal weight range is:   Your goal Body Mass Index (BMI) is 19-24. Healthy food habits can help reduce 3 risk factors for stroke:  High cholesterol, hypertension, and excess weight.  RESOURCES Stroke/Support Group:  Call [phone]   STROKE EDUCATION PROVIDED/REVIEWED AND GIVEN TO PATIENT Stroke warning signs and symptoms How to activate emergency medical system (call 911). Medications prescribed at discharge. Need for follow-up after discharge. Personal risk factors for stroke. Pneumonia vaccine given: No Flu vaccine given: No My questions have been answered, the writing is legible, and I understand these instructions.  I will adhere to these goals & educational materials that have been provided to me after my discharge from the hospital.

## 2021-08-07 NOTE — Assessment & Plan Note (Signed)
CPAP.  

## 2021-08-07 NOTE — Progress Notes (Signed)
OT Cancellation Note  Patient Details Name: Makiyla Linch MRN: [id number] DOB: 1946-04-26   Cancelled Treatment:    Reason Eval/Treat Not Completed: Fatigue/lethargy limiting ability to participate;Other (comment) pt asleep upon arrival, nodding to OTAs questions but not arousing full to participate in session, will check back as time allows for OT intervention.  Lenor Derrick., COTA/L Acute Rehabilitation Services [phone]   Lanyia Jewel 08/07/2021, 8:17 AM

## 2021-08-08 DIAGNOSIS — I639 Cerebral infarction, unspecified: Secondary | ICD-10-CM | POA: Diagnosis not present

## 2021-08-08 LAB — GLUCOSE, CAPILLARY
Glucose-Capillary: 153 mg/dL — ABNORMAL HIGH (ref 70–99)
Glucose-Capillary: 164 mg/dL — ABNORMAL HIGH (ref 70–99)
Glucose-Capillary: 163 mg/dL — ABNORMAL HIGH (ref 70–99)
Glucose-Capillary: 175 mg/dL — ABNORMAL HIGH (ref 70–99)

## 2021-08-08 LAB — CBC WITH DIFFERENTIAL/PLATELET
Abs Immature Granulocytes: 0.03 10*3/uL (ref 0.00–0.07)
Basophils Absolute: 0.1 10*3/uL (ref 0.0–0.1)
Basophils Relative: 1 %
Eosinophils Absolute: 0.3 10*3/uL (ref 0.0–0.5)
Eosinophils Relative: 3 %
HCT: 40.5 % (ref 36.0–46.0)
Hemoglobin: 13.2 g/dL (ref 12.0–15.0)
Immature Granulocytes: 0 %
Lymphocytes Relative: 16 %
Lymphs Abs: 1.6 10*3/uL (ref 0.7–4.0)
MCH: 28.8 pg (ref 26.0–34.0)
MCHC: 32.6 g/dL (ref 30.0–36.0)
MCV: 88.2 fL (ref 80.0–100.0)
Monocytes Absolute: 0.6 10*3/uL (ref 0.1–1.0)
Monocytes Relative: 6 %
Neutro Abs: 7 10*3/uL (ref 1.7–7.7)
Neutrophils Relative %: 74 %
Platelets: 267 10*3/uL (ref 150–400)
RBC: 4.59 MIL/uL (ref 3.87–5.11)
RDW: 14.1 % (ref 11.5–15.5)
WBC: 9.5 10*3/uL (ref 4.0–10.5)
nRBC: 0 % (ref 0.0–0.2)

## 2021-08-08 LAB — COMPREHENSIVE METABOLIC PANEL
ALT: 16 U/L (ref 0–44)
AST: 15 U/L (ref 15–41)
Albumin: 3.2 g/dL — ABNORMAL LOW (ref 3.5–5.0)
Alkaline Phosphatase: 90 U/L (ref 38–126)
Anion gap: 8 (ref 5–15)
BUN: 26 mg/dL — ABNORMAL HIGH (ref 8–23)
CO2: 22 mmol/L (ref 22–32)
Calcium: 9.2 mg/dL (ref 8.9–10.3)
Chloride: 112 mmol/L — ABNORMAL HIGH (ref 98–111)
Creatinine, Ser: 1.07 mg/dL — ABNORMAL HIGH (ref 0.44–1.00)
GFR, Estimated: 54 mL/min — ABNORMAL LOW (ref >60)
Glucose, Bld: 143 mg/dL — ABNORMAL HIGH (ref 70–99)
Potassium: 4.2 mmol/L (ref 3.5–5.1)
Sodium: 142 mmol/L (ref 135–145)
Total Bilirubin: 0.2 mg/dL — ABNORMAL LOW (ref 0.3–1.2)
Total Protein: 6.4 g/dL — ABNORMAL LOW (ref 6.5–8.1)

## 2021-08-08 LAB — MAGNESIUM: Magnesium: 2 mg/dL (ref 1.7–2.4)

## 2021-08-08 LAB — VITAMIN D 25 HYDROXY (VIT D DEFICIENCY, FRACTURES): Vit D, 25-Hydroxy: 37.95 ng/mL (ref 30–100)

## 2021-08-08 MED ORDER — AMLODIPINE BESYLATE 2.5 MG PO TABS
2.5000 mg | ORAL_TABLET | Freq: Every day | ORAL | Status: DC
  Administered 2021-08-09 – 2021-08-22 (×14): 2.5 mg via ORAL
  Filled 2021-08-08 (×14): qty 1

## 2021-08-08 NOTE — Evaluation (Signed)
Speech Language Pathology Assessment and Plan  Patient Details  Name: Alyssa Mayo MRN: [id number] Date of Birth: [birth year]  SLP Diagnosis: Cognitive Impairments  Rehab Potential: Good ELOS: 7-10 days   Today's Date: 08/08/2021 SLP Individual Time: 0814-4818 SLP Individual Time Calculation (min): 67 min  Hospital Problem: Principal Problem:   CVA (cerebral vascular accident) [facility]) Active Problems:   Right middle cerebral artery stroke [facility])  Past Medical History:  Past Medical History:  Diagnosis Date   Abnormal levels of other serum enzymes 07/17/2015   Arthritis    Constipation 07/11/2015   COVID-19 03/06/2021   Diabetes mellitus without complication ([place])    Elevated liver enzymes    Fatty liver    Generalized abdominal pain 07/11/2015   Hyperlipidemia    Hypertension    Lifelong obesity    Macular degeneration    Morbid obesity due to excess calories ([place]) 07/11/2015   Other fatigue 07/11/2015   Sleep apnea    Spinal headache    Past Surgical History:  Past Surgical History:  Procedure Laterality Date   ABDOMINAL HYSTERECTOMY     APPENDECTOMY     CATARACT EXTRACTION     CERVICAL LAMINECTOMY  07/21/1985   CESAREAN SECTION     COLONOSCOPY  07/21/2012   diverticulosis, [facility] Dr. Donnella Sham    COLONOSCOPY WITH PROPOFOL N/A 02/04/2019   Procedure: COLONOSCOPY WITH PROPOFOL;  Surgeon: Lollie Sails, MD;  Location: [facility] ENDOSCOPY;  Service: Endoscopy;  Laterality: N/A;   COLONOSCOPY WITH PROPOFOL N/A 03/18/2021   Procedure: COLONOSCOPY WITH PROPOFOL;  Surgeon: Lesly Rubenstein, MD;  Location: [facility] ENDOSCOPY;  Service: Endoscopy;  Laterality: N/A;  DM   DIAGNOSTIC LAPAROSCOPY     ESOPHAGOGASTRODUODENOSCOPY (EGD) WITH PROPOFOL N/A 02/04/2019   Procedure: ESOPHAGOGASTRODUODENOSCOPY (EGD) WITH PROPOFOL;  Surgeon: Lollie Sails, MD;  Location: [facility] ENDOSCOPY;  Service: Endoscopy;  Laterality: N/A;   ESOPHAGOGASTRODUODENOSCOPY (EGD) WITH PROPOFOL N/A  03/18/2021   Procedure: ESOPHAGOGASTRODUODENOSCOPY (EGD) WITH PROPOFOL;  Surgeon: Lesly Rubenstein, MD;  Location: [facility] ENDOSCOPY;  Service: Endoscopy;  Laterality: N/A;   LOOP RECORDER INSERTION N/A 08/05/2021   Procedure: LOOP RECORDER INSERTION;  Surgeon: Evans Lance, MD;  Location: [place] CV LAB;  Service: Cardiovascular;  Laterality: N/A;   SPINE SURGERY     TUBAL LIGATION      Assessment / Plan / Recommendation Clinical Impression  Alyssa Mayo is a 76 year old right-handed female with history of diabetes mellitus hypertension hyperlipidemia obesity with BMI 36.49 sleep apnea, macular degeneration.  Per chart review patient lives with her children.  1 level home 4 steps to entry.  Independent prior to admission.  Presented 08/01/2021 to [facility] after being found down by her son.  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 Cardiology services has been consulted for loop recorder placement.  Tolerating a regular consistency diet. Therapy evaluations completed due to patient decreased functional mobility was admitted for a comprehensive rehab program.  Pt presents with mild cognitive impairment, primarily in areas of complex problem solving, mental flexibility, verbal fluency, complex memory and awareness. Pt endorses slight cognitive decline at this time. Pt with strengths in orientation, immediate recall, carryover of novel information and simple problem solving, able to recall home medication regime/names with ~75% accuracy. Pt demonstrates adequate expressive/receptive language, however pt requires extra time to respond and appears flat. Noted L neglect and  decreased self-monitoring during evaluation. Pt does demonstrate some emergent awareness of physical and cognitive changes s/p CVA, states she knows she  will be unable to drive and pick up grandchild from school. Pt lives with family and will have support at discharge. Recommend skilled ST during CIR to increase safety and independence with daily routine.    Skilled Therapeutic Interventions          Pt participating in [place] as well as further non-standardized assessments of speech, language and cognition. Please see above.   SLP Assessment  Patient will need skilled [place] Pathology Services during CIR admission    Recommendations  Patient destination: Home Follow up Recommendations: 24 hour supervision/assistance Equipment Recommended: None recommended by SLP    SLP Frequency 3 to 5 out of 7 days   SLP Duration  SLP Intensity  SLP Treatment/Interventions 7-10 days  Minumum of 1-2 x/day, 30 to 90 minutes  Cognitive remediation/compensation;Therapeutic Activities;Therapeutic Exercise;Patient/family education;Functional tasks;Cueing hierarchy    Pain Pain Assessment Pain Scale: 0-10 Pain Score: 0-No pain  Prior Functioning Cognitive/Linguistic Baseline: Within functional limits Type of Home: House  Lives With: Son;Family Available Help at Discharge: Family;Available 24 hours/day Education: college degree Vocation: Retired Oncologist)  SLP Evaluation Cognition Overall Cognitive Status: Impaired/Different from baseline Arousal/Alertness: Awake/alert Orientation Level: Oriented X4 Year: 2023 Month: January Day of Week: Correct Attention: Sustained Sustained Attention: Appears intact Selective Attention: Appears intact Memory: Appears intact Awareness: Impaired Awareness Impairment: Emergent impairment Problem Solving: Impaired Problem Solving Impairment: Verbal complex;Functional complex Executive Function: Self Monitoring Self Monitoring: Impaired Self Monitoring Impairment: Functional complex Safety/Judgment: Appears intact Comments: stating things she used to do at home (driving etc) that she  won't be able to do at discharge  Comprehension Auditory Comprehension Overall Auditory Comprehension: Appears within functional limits for tasks assessed Expression Expression Primary Mode of Expression: Verbal Verbal Expression Overall Verbal Expression: Appears within functional limits for tasks assessed Oral Motor Oral Motor/Sensory Function Overall Oral Motor/Sensory Function: Mild impairment Facial ROM: Reduced left Facial Symmetry: Abnormal symmetry left Facial Strength: Reduced left Lingual ROM: Within Functional Limits Lingual Symmetry: Within Functional Limits Lingual Strength: Within Functional Limits Velum: Within Functional Limits Mandible: Within Functional Limits Motor Speech Overall Motor Speech: Appears within functional limits for tasks assessed Phonation: Low vocal intensity  Care Tool Care Tool Cognition Ability to hear (with hearing aid or hearing appliances if normally used Ability to hear (with hearing aid or hearing appliances if normally used): 0. Adequate - no difficulty in normal conservation, social interaction, listening to TV   Expression of Ideas and Wants Expression of Ideas and Wants: 3. Some difficulty - exhibits some difficulty with expressing needs and ideas (e.g, some words or finishing thoughts) or speech is not clear   Understanding Verbal and Non-Verbal Content Understanding Verbal and Non-Verbal Content: 3. Usually understands - understands most conversations, but misses some part/intent of message. Requires cues at times to understand  Memory/Recall Ability Memory/Recall Ability : Current season;That he or she is in a hospital/hospital unit   Short Term Goals: Week 1: SLP Short Term Goal 1 (Week 1): STG = LTG d/t ELOS  Refer to Care Plan for Long Term Goals  Recommendations for other services: None   Discharge Criteria: Patient will be discharged from SLP if patient refuses treatment 3 consecutive times without medical reason, if  treatment goals not met, if there is a change in medical status, if patient makes no progress towards goals or if patient is discharged from hospital.  The above assessment, treatment plan, treatment alternatives and goals were discussed and mutually agreed upon: by patient  Dewaine Conger 08/08/2021, 11:31 AM

## 2021-08-08 NOTE — Progress Notes (Signed)
Inpatient Rehabilitation Care Coordinator Assessment and Plan Patient Details  Name: Alyssa Mayo MRN: [id number] Date of Birth: 10/20/1945  Today's Date: 08/08/2021  Hospital Problems: Principal Problem:   CVA (cerebral vascular accident) [facility]) Active Problems:   Right middle cerebral artery stroke [facility])  Past Medical History:  Past Medical History:  Diagnosis Date   Abnormal levels of other serum enzymes 07/17/2015   Arthritis    Constipation 07/11/2015   COVID-19 03/06/2021   Diabetes mellitus without complication ([facility])    Elevated liver enzymes    Fatty liver    Generalized abdominal pain 07/11/2015   Hyperlipidemia    Hypertension    Lifelong obesity    Macular degeneration    Morbid obesity due to excess calories ([facility]) 07/11/2015   Other fatigue 07/11/2015   Sleep apnea    Spinal headache    Past Surgical History:  Past Surgical History:  Procedure Laterality Date   ABDOMINAL HYSTERECTOMY     APPENDECTOMY     CATARACT EXTRACTION     CERVICAL LAMINECTOMY  07/21/1985   CESAREAN SECTION     COLONOSCOPY  07/21/2012   diverticulosis, [facility] Dr. [name]    COLONOSCOPY WITH PROPOFOL N/A 02/04/2019   Procedure: COLONOSCOPY WITH PROPOFOL;  Surgeon: [name], MD;  Location: [facility] ENDOSCOPY;  Service: Endoscopy;  Laterality: N/A;   COLONOSCOPY WITH PROPOFOL N/A 03/18/2021   Procedure: COLONOSCOPY WITH PROPOFOL;  Surgeon: [name], MD;  Location: [facility] ENDOSCOPY;  Service: Endoscopy;  Laterality: N/A;  DM   DIAGNOSTIC LAPAROSCOPY     ESOPHAGOGASTRODUODENOSCOPY (EGD) WITH PROPOFOL N/A 02/04/2019   Procedure: ESOPHAGOGASTRODUODENOSCOPY (EGD) WITH PROPOFOL;  Surgeon: [name], MD;  Location: [facility] ENDOSCOPY;  Service: Endoscopy;  Laterality: N/A;   ESOPHAGOGASTRODUODENOSCOPY (EGD) WITH PROPOFOL N/A 03/18/2021   Procedure: ESOPHAGOGASTRODUODENOSCOPY (EGD) WITH PROPOFOL;  Surgeon: [name], MD;  Location: [facility] ENDOSCOPY;  Service:  Endoscopy;  Laterality: N/A;   LOOP RECORDER INSERTION N/A 08/05/2021   Procedure: LOOP RECORDER INSERTION;  Surgeon: [name], MD;  Location: [facility] INVASIVE CV LAB;  Service: Cardiovascular;  Laterality: N/A;   SPINE SURGERY     TUBAL LIGATION     Social History:  reports that she has never smoked. She has never used smokeless tobacco. She reports that she does not currently use alcohol. She reports that she does not use drugs.  Family / Support Systems Marital Status: Widow/Widower Patient Roles: Parent, Caregiver Children: Alyssa Mayo [phone]  Another son out of town Other Supports: [employer], friends and extended family Anticipated Caregiver: [name] and family Ability/Limitations of Caregiver: [name] works from home but can not always go downstairs to check on pt in meetings. May get someone to stay with for the first two weeks while he is upstairs working Caregiver Availability: 24/7 (Son to work on this) Family Dynamics: Close knit with tow son's and family members. She has a good relationship with her friends and church members  Social History Preferred language: English Religion: Protestant Cultural Background: No issues Education: HS Health Literacy - How often do you need to have someone help you when you read instructions, pamphlets, or other written material from your doctor or pharmacy?: Never Writes: Yes Employment Status: Retired Marine scientist Issues: No issues Guardian/Conservator: None-according to MD pt is capable of making her own decisions while here   Abuse/Neglect Abuse/Neglect Assessment Can Be Completed: Yes Physical Abuse: Denies Verbal Abuse: Denies Sexual Abuse: Denies Exploitation of patient/patient's resources: Denies Self-Neglect: Denies  Patient response to:  Social Isolation - How often do you feel lonely or isolated from those around you?: Rarely  Emotional Status Pt's affect, behavior and adjustment status: Pt is somehwat  flat but also very tired and has a headache from last night. She was participatory in therapies and feels did well but concerned abotu her vision. She has always been independent and wants to get back to this level before leaving here. She does not want to burden her son or his family Recent Psychosocial Issues: other health issues was managing but had recently been ill with PNA and was at Urgent care 1/3 Psychiatric History: No history due to how flat pt is she may benefit from seeing neuro-psych while here, for coping. Substance Abuse History: No issues  Patient / Family Perceptions, Expectations & Goals Pt/Family understanding of illness & functional limitations: Pt and son can explakin her stroke, son has a good understanding of her plan and has seen her in therapies to see her visual deficits. Both have spoken with the MD and feel have a good understanding of her plan moving forward. Both glad she is here on rehab Premorbid pt/family roles/activities: Mom, retiree, widow, grandmother, church member, etc Anticipated changes in roles/activities/participation: resume Pt/family expectations/goals: Pt states: " I will do my best. I hope I do well."  Son states: " She is quiet always has been but will do well there."  [employer]:  (Recently diagnosed with PNA 1/3) Premorbid Home Care/DME Agencies: Other (Comment) (cane and 3 in 1) Transportation available at discharge: Pt drove PTA now ill rely upon her family until she is able to again Is the patient able to respond to transportation needs?: Yes In the past 12 months, has lack of transportation kept you from medical appointments or from getting medications?: No In the past 12 months, has lack of transportation kept you from meetings, work, or from getting things needed for daily living?: No Resource referrals recommended: Neuropsychology  Discharge Planning Living Arrangements: Children, Other relatives Support  Systems: Children, Other relatives, Friends/neighbors, Church/faith community Type of Residence: Private residence Insurance Resources: Media planner (specify) Radiographer, therapeutic) Financial Resources: Tree surgeon, Family Support Financial Screen Referred: No Living Expenses: Lives with family Money Management: Patient, Family Does the patient have any problems obtaining your medications?: No Home Management: All participate in home management Patient/Family Preliminary Plans: Return to son's home she assisted with 72 yo grandson-Logan after school each day. She misses him and would like for him to visit would need to be outside since not allowing children under 12 inside the building. Son works upstairs from home and is thinking about having someone downstairs with her, while he is working to make transition go smoothly once home. Care Coordinator Barriers to Discharge: Decreased caregiver support, Insurance for SNF coverage Care Coordinator Anticipated Follow Up Needs: HH/OP, Support Group  Clinical Impression Pleasant quiet female who is very flat, but may be tired from therapies and did not sleep last night due to headache. Lives with son and his family. Son is going on a cruise 1/26-1/31 but will have his brother come down to be with pt if she goes home before this time. Will await therapy team evaluations and work on discharge needs.  Lucy Chris 08/08/2021, 11:42 AM

## 2021-08-08 NOTE — Progress Notes (Signed)
Patient son states that he has taken his mom's cell phone home with him, assigned nurse informed ([email or web address]).

## 2021-08-08 NOTE — Evaluation (Signed)
Occupational Therapy Assessment and Plan  Patient Details  Name: Alyssa Mayo MRN: [id number] Date of Birth: [birth year]  OT Diagnosis: abnormal posture, disturbance of vision, hemiplegia affecting non-dominant side, and unsteady  Rehab Potential: Rehab Potential (ACUTE ONLY): Excellent ELOS: 7-10 days   Today's Date: 08/08/2021 OT Individual Time: 2902-1115 OT Individual Time Calculation (min): 30 min     Hospital Problem: Principal Problem:   CVA (cerebral vascular accident) [facility]) Active Problems:   Right middle cerebral artery stroke [facility])   Past Medical History:  Past Medical History:  Diagnosis Date   Abnormal levels of other serum enzymes 07/17/2015   Arthritis    Constipation 07/11/2015   COVID-19 03/06/2021   Diabetes mellitus without complication ([place])    Elevated liver enzymes    Fatty liver    Generalized abdominal pain 07/11/2015   Hyperlipidemia    Hypertension    Lifelong obesity    Macular degeneration    Morbid obesity due to excess calories ([place]) 07/11/2015   Other fatigue 07/11/2015   Sleep apnea    Spinal headache    Past Surgical History:  Past Surgical History:  Procedure Laterality Date   ABDOMINAL HYSTERECTOMY     APPENDECTOMY     CATARACT EXTRACTION     CERVICAL LAMINECTOMY  07/21/1985   CESAREAN SECTION     COLONOSCOPY  07/21/2012   diverticulosis, [facility] Dr. Donnella Sham    COLONOSCOPY WITH PROPOFOL N/A 02/04/2019   Procedure: COLONOSCOPY WITH PROPOFOL;  Surgeon: Lollie Sails, MD;  Location: [facility] ENDOSCOPY;  Service: Endoscopy;  Laterality: N/A;   COLONOSCOPY WITH PROPOFOL N/A 03/18/2021   Procedure: COLONOSCOPY WITH PROPOFOL;  Surgeon: Lesly Rubenstein, MD;  Location: [facility] ENDOSCOPY;  Service: Endoscopy;  Laterality: N/A;  DM   DIAGNOSTIC LAPAROSCOPY     ESOPHAGOGASTRODUODENOSCOPY (EGD) WITH PROPOFOL N/A 02/04/2019   Procedure: ESOPHAGOGASTRODUODENOSCOPY (EGD) WITH PROPOFOL;  Surgeon: Lollie Sails, MD;  Location: [facility]  ENDOSCOPY;  Service: Endoscopy;  Laterality: N/A;   ESOPHAGOGASTRODUODENOSCOPY (EGD) WITH PROPOFOL N/A 03/18/2021   Procedure: ESOPHAGOGASTRODUODENOSCOPY (EGD) WITH PROPOFOL;  Surgeon: Lesly Rubenstein, MD;  Location: [facility] ENDOSCOPY;  Service: Endoscopy;  Laterality: N/A;   LOOP RECORDER INSERTION N/A 08/05/2021   Procedure: LOOP RECORDER INSERTION;  Surgeon: Evans Lance, MD;  Location: [place] CV LAB;  Service: Cardiovascular;  Laterality: N/A;   SPINE SURGERY     TUBAL LIGATION      Assessment & Plan Clinical Impression: Alyssa Mayo is a 76 year old right-handed female with history of diabetes mellitus hypertension hyperlipidemia obesity with BMI 36.49 sleep apnea, macular degeneration.  Per chart review patient lives with her children.  1 level home 4 steps to entry.  Independent prior to admission.  Presented 08/01/2021 to [facility] after being found down by her son.  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  CT angiogram head and neck nonocclusive filling defect in the right MCA bifurcation with irregular diminished flow in the inferior M2.  No other significant stenosis or large vessel occlusion.  Echocardiogram with ejection fraction of 55 to 52% grade 1 diastolic dysfunction.  Admission chemistry unremarkable except glucose 113 BUN 31 creatinine 1.17, WBC 14,000, urinalysis negative nitrite, hemoglobin A1c 7.4.  Presently maintained on low-dose aspirin for CVA prophylaxis given hemorrhagic transformation.  Latest CT follow-up 08/03/2021 shows no interval progression of recent right MCA infarct with petechial  hemorrhage.  Cardiology services has been consulted for loop recorder placement.  Tolerating a regular consistency diet.   Patient transferred to CIR on 08/07/2021 .    Patient currently requires min with  basic self-care skills secondary to muscle weakness, decreased cardiorespiratoy endurance, abnormal tone, decreased coordination, and decreased motor planning, decreased visual perceptual skills, decreased visual motor skills, and field cut, decreased attention to left and left side neglect, decreased attention, decreased awareness, decreased problem solving, and delayed processing, and decreased sitting balance, decreased standing balance, decreased postural control, hemiplegia, and decreased balance strategies.  Prior to hospitalization, patient could complete ADLs and IADLs with modified independent .  Patient will benefit from skilled intervention to increase independence with basic self-care skills prior to discharge home with care partner.  Anticipate patient will require intermittent supervision and follow up outpatient.  OT - End of Session Activity Tolerance: Tolerates 30+ min activity with multiple rests Endurance Deficit: Yes Endurance Deficit Description: Pt benefited from completing self care tasks in seated position due to limited standing endurance. OT Assessment Rehab Potential (ACUTE ONLY): Excellent OT Patient demonstrates impairments in the following area(s): Balance;Perception;Cognition;Sensory;Edema;Skin Integrity;Endurance;Vision;Motor OT Basic ADL's Functional Problem(s): Grooming;Bathing;Dressing;Toileting OT Transfers Functional Problem(s): Toilet;Tub/Shower OT Additional Impairment(s): Fuctional Use of Upper Extremity OT Plan OT Intensity: Minimum of 1-2 x/day, 45 to 90 minutes OT Frequency: 5 out of 7 days OT Duration/Estimated Length of Stay: 7-10 days OT Treatment/Interventions: Balance/vestibular training;Discharge planning;Functional electrical stimulation;Pain management;Self Care/advanced ADL retraining;Therapeutic Activities;UE/LE Coordination activities;Cognitive remediation/compensation;Disease mangement/prevention;Functional mobility training;Patient/family  education;Skin care/wound managment;Therapeutic Exercise;Visual/perceptual remediation/compensation;Community reintegration;DME/adaptive equipment instruction;Neuromuscular re-education;Psychosocial support;Splinting/orthotics;UE/LE Strength taining/ROM;Wheelchair propulsion/positioning OT Basic Self-Care Anticipated Outcome(s): mod I OT Toileting Anticipated Outcome(s): mod I OT Bathroom Transfers Anticipated Outcome(s): mod I OT Recommendation Patient destination: Home Follow Up Recommendations: Other (comment);Outpatient OT (intemittent supervision for IADLs) Equipment Recommended: To be determined   OT Evaluation Precautions/Restrictions  Precautions Precautions: Fall;Other (comment) Precaution Comments: Loop recorder; left neglect versus visual field cut Restrictions Weight Bearing Restrictions: No Pain Pain Assessment Pain Scale: 0-10 Pain Score: 0-No pain Multiple Pain Sites: No Home Living/Prior Functioning Home Living Living Arrangements: Children, Other relatives Available Help at Discharge: Available 24 hours/day (son works full time from home) Type of Home: House Home Access: Stairs to enter Technical brewer of Steps: 4 Entrance Stairs-Rails: Can reach both Home Layout: Two level, Full bath on main level, Able to live on main level with bedroom/bathroom Alternate Level Stairs-Number of Steps: 12 Alternate Level Stairs-Rails: Right Bathroom Shower/Tub: Gaffer, Other (comment) Bathroom Toilet: Handicapped height Bathroom Accessibility: Yes  Lives With: Son, Other (Comment) (21 yo grandson) IADL History Education: college degree Prior Function Level of Independence: Independent with homemaking with ambulation, Independent with gait, Independent with basic ADLs, Independent with transfers  Able to Take Stairs?: Yes Driving: Yes Vocation: Retired Surveyor, mining Baseline Vision/History: 1 Wears glasses Ability to See in Adequate Light: 0 Adequate Patient  Visual Report: No change from baseline Vision Assessment?: Yes Eye Alignment: Impaired (comment) Ocular Range of Motion: Within Functional Limits Alignment/Gaze Preference: Within Defined Limits Tracking/Visual Pursuits: Impaired - to be further tested in functional context;Decreased smoothness of horizontal tracking;Requires cues, head turns, or add eye shifts to track Saccades: Additional head turns occurred during testing;Additional eye shifts occurred during testing Visual Fields: Left visual field deficit;Impaired-to be further tested in functional context Perception  Perception: Impaired Inattention/Neglect: Does not attend to left visual field Praxis Praxis: Impaired Praxis Impairment Details: Motor planning Praxis-Other Comments: Impaired LUE Cognition Overall Cognitive Status: Within Functional Limits for tasks  assessed Arousal/Alertness: Awake/alert Orientation Level: Person;Place;Situation Person: Oriented Place: Oriented Situation: Oriented Year: 2023 Month: January Day of Week: Correct Memory: Appears intact Immediate Memory Recall: Sock;Blue;Bed Memory Recall Sock: Without Cue Memory Recall Blue: Without Cue Memory Recall Bed: Without Cue Attention: Selective;Focused;Sustained Focused Attention: Appears intact Sustained Attention: Appears intact Selective Attention: Impaired Selective Attention Impairment: Functional basic Awareness: Impaired Awareness Impairment: Emergent impairment Problem Solving: Impaired Problem Solving Impairment: Verbal complex;Functional complex Executive Function: Self Monitoring Self Monitoring: Impaired Self Monitoring Impairment: Functional complex Behaviors: Impulsive (will stand as soon as directed) Safety/Judgment: Appears intact Comments: stating things she used to do at home (driving etc) that she won't be able to do at discharge Sensation Sensation Light Mayo: Appears Intact Hot/Cold: Appears Intact Proprioception:  Impaired by gross assessment;Impaired Detail Proprioception Impaired Details: Impaired LUE Stereognosis: Not tested Additional Comments: will continue to assess proprioceptive deficits in functional environment Coordination Gross Motor Movements are Fluid and Coordinated: No Fine Motor Movements are Fluid and Coordinated: No Coordination and Movement Description: L hemiplegic gait w/ SPC, absent arm swing, L inattention Finger Nose Finger Test: Pt able to compensate for left wrist and hand weakness and WNL BUE Motor  Motor Motor: Hemiplegia Motor - Skilled Clinical Observations: L hemiplegia  Trunk/Postural Assessment  Cervical Assessment Cervical Assessment: Within Functional Limits Thoracic Assessment Thoracic Assessment: Exceptions to WFL (kyphosis) Lumbar Assessment Lumbar Assessment: Within Functional Limits Postural Control Postural Control: Within Functional Limits  Balance Balance Balance Assessed: Yes Standardized Balance Assessment Standardized Balance Assessment: Timed Up and Go Test Timed Up and Go Test TUG: Normal TUG Normal TUG (seconds): 35 Static Sitting Balance Static Sitting - Balance Support: Feet supported;Right upper extremity supported Static Sitting - Level of Assistance: 5: Stand by assistance Dynamic Sitting Balance Dynamic Sitting - Balance Support: During functional activity;No upper extremity supported Dynamic Sitting - Level of Assistance: 5: Stand by assistance Static Standing Balance Static Standing - Balance Support: Right upper extremity supported Static Standing - Level of Assistance: 4: Min assist Dynamic Standing Balance Dynamic Standing - Balance Support: Right upper extremity supported Dynamic Standing - Level of Assistance: 4: Min assist Extremity/Trunk Assessment RUE Assessment RUE Assessment: Exceptions to [facility] General Strength Comments: 4+/5 RUE LUE Assessment LUE Assessment: Exceptions to [facility] Passive Range of Motion (PROM)  Comments: WNL Active Range of Motion (AROM) Comments: Able to achieve shoulder scaption 110 degrees; little to no AROM left wrist and digits General Strength Comments: Shoulder 2+/5 ; elbow 3+/5 flex and ext; forearm 3-/5 wrist 2-/5 digits 2-/5 LUE Body System: Neuro Brunstrum levels for arm and hand: Arm;Hand Brunstrum level for arm: Stage IV Movement is deviating from synergy Brunstrum level for hand: Stage I Flaccidity  Care Tool Care Tool Self Care Eating   Eating Assist Level: Set up assist    Oral Care    Oral Care Assist Level: Minimal Assistance - Patient > 75%    Bathing         Assist Level: Minimal Assistance - Patient > 75%    Upper Body Dressing(including orthotics)       Assist Level: Minimal Assistance - Patient > 75%    Lower Body Dressing (excluding footwear)     Assist for lower body dressing: Minimal Assistance - Patient > 75%    Putting on/Taking off footwear   What is the patient wearing?: Shoes Assist for footwear: Minimal Assistance - Patient > 75%       Care Tool Toileting Toileting activity   Assist for toileting: Minimal Assistance -  Patient > 75%     Care Tool Bed Mobility Roll left and right activity   Roll left and right assist level: Moderate Assistance - Patient 50 - 74%    Sit to lying activity   Sit to lying assist level: Moderate Assistance - Patient 50 - 74%    Lying to sitting on side of bed activity   Lying to sitting on side of bed assist level: the ability to move from lying on the back to sitting on the side of the bed with no back support.: Moderate Assistance - Patient 50 - 74%     Care Tool Transfers Sit to stand transfer   Sit to stand assist level: Minimal Assistance - Patient > 75%    Chair/bed transfer   Chair/bed transfer assist level: Minimal Assistance - Patient > 75%     Toilet transfer   Assist Level: Minimal Assistance - Patient > 75%     Care Tool Cognition  Expression of Ideas and Wants Expression  of Ideas and Wants: 3. Some difficulty - exhibits some difficulty with expressing needs and ideas (e.g, some words or finishing thoughts) or speech is not clear  Understanding Verbal and Non-Verbal Content Understanding Verbal and Non-Verbal Content: 3. Usually understands - understands most conversations, but misses some part/intent of message. Requires cues at times to understand   Memory/Recall Ability Memory/Recall Ability : Current season;That he or she is in a hospital/hospital unit   Refer to Care Plan for [place] 1 OT Short Term Goal 1 (Week 1): STGs=LTGs due to ELOS  Recommendations for other services: None    Skilled Therapeutic Intervention ADL ADL Eating: Set up Where Assessed-Eating: Chair Upper Body Bathing: Minimal assistance Where Assessed-Upper Body Bathing: Shower Lower Body Bathing: Minimal assistance Where Assessed-Lower Body Bathing: Shower Upper Body Dressing: Minimal assistance Where Assessed-Upper Body Dressing: Chair Lower Body Dressing: Moderate assistance Where Assessed-Lower Body Dressing: Chair Toileting: Moderate assistance Where Assessed-Toileting: Glass blower/designer: Psychiatric nurse Method: Ambulating;Stand pivot Social research officer, government: Environmental education officer Method: Stand pivot;Ambulating Youth worker: Transfer tub bench;Grab bars Mobility  Bed Mobility Bed Mobility: Sit to Supine;Scooting to HOB Sit to Supine: Moderate Assistance - Patient 50-74% Scooting to HOB: Maximal Assistance - Patient 25-49% Transfers Sit to Stand: Minimal Assistance - Patient > 75% Stand to Sit: Minimal Assistance - Patient > 75%  Skilled Intervention:  Pt sitting up in recliner, requesting to shower.  MD provided verbal clearance as long as loop recorder incision covered with waterproof barrier.  Pt also requesting to use bathroom. Initial evaluation completed and collaborated with  pt on POC.  Pts primary goal for therapy is to return to [facility]F in order to see her grandson who lives with her as soon as she can.   Pt completed all self care and functional mobility per above levels of assist.  Pt did need cues to attend to left side of environment while ambulating within room to avoid obstacles including doorway, sink, and shower curtain.  Pt returned to recliner and direct hand off to PT at end of session.   Discharge Criteria: Patient will be discharged from OT if patient refuses treatment 3 consecutive times without medical reason, if treatment goals not met, if there is a change in medical status, if patient makes no progress towards goals or if patient is discharged from hospital.  The above assessment, treatment plan, treatment alternatives and goals were discussed and mutually  agreed upon: by patient  Ezekiel Slocumb 08/08/2021, 4:06 PM

## 2021-08-08 NOTE — Progress Notes (Signed)
Inpatient Rehabilitation Center Individual Statement of Services  Patient Name:  Alyssa Mayo  Date:  08/08/2021  Welcome to the Inpatient Rehabilitation Center.  Our goal is to provide you with an individualized program based on your diagnosis and situation, designed to meet your specific needs.  With this comprehensive rehabilitation program, you will be expected to participate in at least 3 hours of rehabilitation therapies Monday-Friday, with modified therapy programming on the weekends.  Your rehabilitation program will include the following services:  Physical Therapy (PT), Occupational Therapy (OT), Speech Therapy (ST), 24 hour per day rehabilitation nursing, Therapeutic Recreaction (TR), Neuropsychology, Care Coordinator, Rehabilitation Medicine, Nutrition Services, and Pharmacy Services  Weekly team conferences will be held on Wednesday to discuss your progress.  Your Inpatient Rehabilitation Care Coordinator will talk with you frequently to get your input and to update you on team discussions.  Team conferences with you and your family in attendance may also be held.  Expected length of stay: 7-10 days  Overall anticipated outcome: Independent with device  Depending on your progress and recovery, your program may change. Your Inpatient Rehabilitation Care Coordinator will coordinate services and will keep you informed of any changes. Your Inpatient Rehabilitation Care Coordinator's name and contact numbers are listed  below.  The following services may also be recommended but are not provided by the Inpatient Rehabilitation Center:  Driving Evaluations Home Health Rehabiltiation Services Outpatient Rehabilitation Services    Arrangements will be made to provide these services after discharge if needed.  Arrangements include referral to agencies that provide these services.  Your insurance has been verified to be:  [employer] Your primary doctor is:  [name]  Pertinent information will be shared with your doctor and your insurance company.  Inpatient Rehabilitation Care Coordinator:  Dossie Der, [place] [phone] or Luna [place]  Information discussed with and copy given to patient by: Lucy Chris, 08/08/2021, 11:44 AM

## 2021-08-08 NOTE — Plan of Care (Signed)
°  Problem: RH Problem Solving Goal: LTG Patient will demonstrate problem solving for (SLP) Description: LTG:  Patient will demonstrate problem solving for basic/complex daily situations with cues  (SLP) Flowsheets (Taken 08/08/2021 0922) LTG: Patient will demonstrate problem solving for (SLP): Complex daily situations LTG Patient will demonstrate problem solving for: Supervision   Problem: RH Memory Goal: LTG Patient will demonstrate ability for day to day (SLP) Description: LTG:   Patient will demonstrate ability for day to day recall/carryover during cognitive/linguistic activities with assist  (SLP) Flowsheets (Taken 08/08/2021 0922) LTG: Patient will demonstrate ability for day to day recall: Daily complex information LTG: Patient will demonstrate ability for day to day recall/carryover during cognitive/linguistic activities with assist (SLP): Supervision   Problem: RH Awareness Goal: LTG: Patient will demonstrate awareness during functional activites type of (SLP) Description: LTG: Patient will demonstrate awareness during functional activites type of (SLP) Flowsheets (Taken 08/08/2021 6644) Patient will demonstrate during cognitive/linguistic activities awareness type of: Anticipatory LTG: Patient will demonstrate awareness during cognitive/linguistic activities with assistance of (SLP): Supervision

## 2021-08-08 NOTE — Evaluation (Signed)
Physical Therapy Assessment and Plan  Patient Details  Name: Alyssa Mayo MRN: [id number] Date of Birth: 25-Nov-1945  PT Diagnosis: Abnormality of gait, Coordination disorder, Difficulty walking, and Hemiplegia non-dominant Rehab Potential: Excellent ELOS: 7-10 days   Today's Date: 08/08/2021 PT Individual Time: 1005-1100 PT Individual Time Calculation (min): 4 min    Hospital Problem: Principal Problem:   CVA (cerebral vascular accident) [facility]) Active Problems:   Right middle cerebral artery stroke [facility])   Past Medical History:  Past Medical History:  Diagnosis Date   Abnormal levels of other serum enzymes 07/17/2015   Arthritis    Constipation 07/11/2015   COVID-19 03/06/2021   Diabetes mellitus without complication ([place])    Elevated liver enzymes    Fatty liver    Generalized abdominal pain 07/11/2015   Hyperlipidemia    Hypertension    Lifelong obesity    Macular degeneration    Morbid obesity due to excess calories ([place]) 07/11/2015   Other fatigue 07/11/2015   Sleep apnea    Spinal headache    Past Surgical History:  Past Surgical History:  Procedure Laterality Date   ABDOMINAL HYSTERECTOMY     APPENDECTOMY     CATARACT EXTRACTION     CERVICAL LAMINECTOMY  07/21/1985   CESAREAN SECTION     COLONOSCOPY  07/21/2012   diverticulosis, [facility] Dr. Donnella Sham    COLONOSCOPY WITH PROPOFOL N/A 02/04/2019   Procedure: COLONOSCOPY WITH PROPOFOL;  Surgeon: Lollie Sails, MD;  Location: [facility] ENDOSCOPY;  Service: Endoscopy;  Laterality: N/A;   COLONOSCOPY WITH PROPOFOL N/A 03/18/2021   Procedure: COLONOSCOPY WITH PROPOFOL;  Surgeon: Lesly Rubenstein, MD;  Location: [facility] ENDOSCOPY;  Service: Endoscopy;  Laterality: N/A;  DM   DIAGNOSTIC LAPAROSCOPY     ESOPHAGOGASTRODUODENOSCOPY (EGD) WITH PROPOFOL N/A 02/04/2019   Procedure: ESOPHAGOGASTRODUODENOSCOPY (EGD) WITH PROPOFOL;  Surgeon: Lollie Sails, MD;  Location: [facility] ENDOSCOPY;  Service: Endoscopy;  Laterality:  N/A;   ESOPHAGOGASTRODUODENOSCOPY (EGD) WITH PROPOFOL N/A 03/18/2021   Procedure: ESOPHAGOGASTRODUODENOSCOPY (EGD) WITH PROPOFOL;  Surgeon: Lesly Rubenstein, MD;  Location: [facility] ENDOSCOPY;  Service: Endoscopy;  Laterality: N/A;   LOOP RECORDER INSERTION N/A 08/05/2021   Procedure: LOOP RECORDER INSERTION;  Surgeon: Evans Lance, MD;  Location: [place] CV LAB;  Service: Cardiovascular;  Laterality: N/A;   SPINE SURGERY     TUBAL LIGATION      Assessment & Plan Clinical Impression: Patient is a 76 year old right-handed female with history of diabetes mellitus hypertension hyperlipidemia obesity with BMI 36.49 sleep apnea, macular degeneration.  Per chart review patient lives with her children.  1 level home 4 steps to entry.  Independent prior to admission.  Presented 08/01/2021 to [facility] after being found down by her son.  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  CT angiogram head and neck nonocclusive filling defect in the right MCA bifurcation with irregular diminished flow in the inferior M2.  No other significant stenosis or large vessel occlusion.  Echocardiogram with ejection fraction of 55 to 83% grade 1 diastolic dysfunction.  Admission chemistry unremarkable except glucose 113 BUN 31 creatinine 1.17, WBC 14,000, urinalysis negative nitrite, hemoglobin A1c 7.4.  Presently maintained on low-dose aspirin for CVA prophylaxis given hemorrhagic transformation.  Latest CT follow-up 08/03/2021 shows no interval progression of recent right MCA infarct with petechial hemorrhage.  Cardiology services has been consulted for loop  recorder placement.  Tolerating a regular consistency diet.  Patient transferred to CIR on 08/07/2021 .   Patient currently requires min with mobility secondary to decreased cardiorespiratoy  endurance, impaired timing and sequencing, and decreased visual perceptual skills and L field cut.  Prior to hospitalization, patient was independent  with mobility and lived with Son, Other (Comment) (grandson - 9y/o) in a House home.  Home access is 4Stairs to enter.  Patient will benefit from skilled PT intervention to maximize safe functional mobility and minimize fall risk for planned discharge home with intermittent assist.  Anticipate patient will benefit from follow up OP at discharge.  PT - End of Session Activity Tolerance: Tolerates 10 - 20 min activity with multiple rests Endurance Deficit: Yes Endurance Deficit Description: Pt fatigued at end of session, requesting to rest in bed. Difficulty w/ L foot clearance at end of session PT Assessment Rehab Potential (ACUTE/IP ONLY): Excellent PT Barriers to Discharge: Home environment access/layout PT Barriers to Discharge Comments: 4 stairs to enter, first floor setup PT Patient demonstrates impairments in the following area(s): Motor;Endurance;Balance;Safety PT Transfers Functional Problem(s): Bed Mobility;Bed to Chair;Car PT Locomotion Functional Problem(s): Ambulation;Stairs PT Plan PT Intensity: Minimum of 1-2 x/day ,45 to 90 minutes PT Frequency: 5 out of 7 days PT Duration Estimated Length of Stay: 7-10 days PT Treatment/Interventions: Ambulation/gait training;DME/adaptive equipment instruction;Neuromuscular re-education;Stair training;UE/LE Strength taining/ROM;UE/LE Coordination activities;Wheelchair propulsion/positioning;Therapeutic Exercise;Therapeutic Activities;Patient/family education;Pain management;Discharge planning;Balance/vestibular training;Cognitive remediation/compensation;Disease management/prevention;Functional mobility training;Visual/perceptual remediation/compensation PT Transfers Anticipated Outcome(s): mod I PT Locomotion Anticipated Outcome(s): mod I PT Recommendation Follow Up Recommendations: Outpatient  PT Patient destination: Home Equipment Recommended: To be determined   PT Evaluation Precautions/Restrictions Precautions Precautions: Fall;Other (comment) Precaution Comments: Loop recorder; left neglect versus visual field cut Restrictions Weight Bearing Restrictions: No General Chart Reviewed: Yes Additional Pertinent History: Lives w/ son and grandson, son works from home, 4 steps to enter, first floor bed/bath Designer, industrial/product Present: No Vital Signs Pain Pain Assessment Pain Scale: 0-10 Pain Score: 0-No pain Multiple Pain Sites: No Pain Interference Pain Interference Pain Effect on Sleep: 3. Frequently Pain Interference with Therapy Activities: 1. Rarely or not at all Pain Interference with Day-to-Day Activities: 1. Rarely or not at all Home Living/Prior [place] Available Help at Discharge: Available 24 hours/day (son works full time from home) Type of Home: House Home Access: Stairs to enter [employer] of Steps: 4 Entrance Stairs-Rails: Can reach both Home Layout: Two level;Full bath on main level;Able to live on main level with bedroom/bathroom Alternate Level Stairs-Number of Steps: 12 Alternate Level Stairs-Rails: Right Bathroom Shower/Tub: Walk-in shower;Other (comment) Bathroom Toilet: Handicapped height Bathroom Accessibility: Yes  Lives With: Son;Other (Comment) (7 yo grandson) Prior Function Level of Independence: Independent with homemaking with ambulation;Independent with gait;Independent with basic ADLs;Independent with transfers  Able to Take Stairs?: Yes Driving: Yes Vocation: Retired Vision/Perception  Vision - History Ability to See in Adequate Light: 0 Adequate Vision - Assessment Eye Alignment: Impaired (comment) Ocular Range of Motion: Within Functional Limits Alignment/Gaze Preference: Within Defined Limits Tracking/Visual Pursuits: Impaired - to be further tested in functional context;Decreased smoothness  of horizontal tracking Perception Inattention/Neglect: Does not attend to left visual field Praxis Praxis: Intact  Cognition Overall Cognitive Status: Within Functional Limits for tasks assessed Arousal/Alertness: Awake/alert Orientation Level: Oriented X4 Year: 2023 Month: January Day of Week: Correct Attention: Selective;Focused;Sustained Focused Attention: Appears intact Sustained Attention: Appears intact Selective Attention: Impaired Selective Attention Impairment: Functional basic Memory: Appears intact Awareness: Impaired Awareness Impairment: Emergent impairment Problem Solving:  Impaired Problem Solving Impairment: Verbal complex;Functional complex Executive Function: Self Monitoring Self Monitoring: Impaired Self Monitoring Impairment: Functional complex Behaviors: Impulsive (will stand as soon as directed) Safety/Judgment: Appears intact Comments: stating things she used to do at home (driving etc) that she won't be able to do at discharge Sensation Sensation Light Touch: Appears Intact Hot/Cold: Not tested Proprioception: Not tested Stereognosis: Not tested Additional Comments: will continue to assess proprioceptive deficits in functional environment Coordination Gross Motor Movements are Fluid and Coordinated: No Fine Motor Movements are Fluid and Coordinated: Not tested Coordination and Movement Description: L hemiplegic gait w/ SPC, absent arm swing, L inattention Motor  Motor Motor: Hemiplegia Motor - Skilled Clinical Observations: L hemiplegia   Trunk/Postural Assessment  Cervical Assessment Cervical Assessment: Within Functional Limits Thoracic Assessment Thoracic Assessment: Exceptions to [facility] (kyphotic posture) Lumbar Assessment Lumbar Assessment: Within Functional Limits Postural Control Postural Control: Within Functional Limits  Balance Balance Balance Assessed: Yes Standardized Balance Assessment Standardized Balance Assessment: Timed Up  and Go Test Timed Up and Go Test TUG: Normal TUG Normal TUG (seconds): 35 Static Sitting Balance Static Sitting - Balance Support: Feet supported;Right upper extremity supported Static Sitting - Level of Assistance: 5: Stand by assistance Static Standing Balance Static Standing - Balance Support: Right upper extremity supported Static Standing - Level of Assistance: 4: Min assist Dynamic Standing Balance Dynamic Standing - Balance Support: Right upper extremity supported Dynamic Standing - Level of Assistance: 4: Min assist Extremity Assessment      RLE Assessment RLE Assessment: Within Functional Limits General Strength Comments: hip 4/5, knee and ankle grossly 5/5 LLE Assessment LLE Assessment: Within Functional Limits General Strength Comments: hip flexion 3+/5, knee and ankle grossly 4/5  Care Tool Care Tool Bed Mobility Roll left and right activity   Roll left and right assist level: Moderate Assistance - Patient 50 - 74%    Sit to lying activity   Sit to lying assist level: Moderate Assistance - Patient 50 - 74%    Lying to sitting on side of bed activity   Lying to sitting on side of bed assist level: the ability to move from lying on the back to sitting on the side of the bed with no back support.: Moderate Assistance - Patient 50 - 74%     Care Tool Transfers Sit to stand transfer   Sit to stand assist level: Minimal Assistance - Patient > 75%    Chair/bed transfer   Chair/bed transfer assist level: Minimal Assistance - Patient > 75%     Psychologist, counselling transfer activity did not occur: Safety/medical concerns        Care Tool Locomotion Ambulation   Assist level: Minimal Assistance - Patient > 75% Assistive device: Cane-straight    Walk 10 feet activity   Assist level: Minimal Assistance - Patient > 75% Assistive device: Cane-straight   Walk 50 feet with 2 turns activity   Assist level: Minimal Assistance - Patient >  75% Assistive device: Cane-straight  Walk 150 feet activity Walk 150 feet activity did not occur: Safety/medical concerns      Walk 10 feet on uneven surfaces activity Walk 10 feet on uneven surfaces activity did not occur: Safety/medical concerns      Stairs   Assist level: Minimal Assistance - Patient > 75% Stairs assistive device: 1 hand rail Max number of stairs: 4  Walk up/down 1 step activity   Walk up/down 1 step (curb) assist  level: Minimal Assistance - Patient > 75% Walk up/down 1 step or curb assistive device: 1 hand rail  Walk up/down 4 steps activity   Walk up/down 4 steps assist level: Minimal Assistance - Patient > 75%    Walk up/down 12 steps activity Walk up/down 12 steps activity did not occur: Safety/medical concerns      Pick up small objects from floor Pick up small object from the floor (from standing position) activity did not occur: Safety/medical concerns      Wheelchair Is the patient using a wheelchair?: No   Wheelchair activity did not occur: N/A      Wheel 50 feet with 2 turns activity Wheelchair 50 feet with 2 turns activity did not occur: N/A    Wheel 150 feet activity Wheelchair 150 feet activity did not occur: N/A      Refer to Care Plan for Long Term Goals  SHORT TERM GOAL WEEK 1 PT Short Term Goal 1 (Week 1): Pt will complete supine<>sit transfer w/ S. PT Short Term Goal 2 (Week 1): Pt will transfer bed to chair w/ LRAD w/ S. PT Short Term Goal 3 (Week 1): Pt will ambulate 150 ft w/ LRAD w/ S. PT Short Term Goal 4 (Week 1): Pt will complete 4 steps w/ R hand rail w/ S  Recommendations for other services: None   Skilled Therapeutic Intervention Mobility Bed Mobility Bed Mobility: Sit to Supine;Scooting to [facility] Sit to Supine: Moderate Assistance - Patient 50-74% Scooting to [facility]: Maximal Assistance - Patient 25-49% Transfers Transfers: Risk manager;Sit to Stand Sit to Stand: Minimal Assistance - Patient > 75% Stand Pivot  Transfers: Minimal Assistance - Patient > 75% Stand Pivot Transfer Details: Verbal cues for precautions/safety Transfer (Assistive device): Straight cane Locomotion  Gait Ambulation: Yes Gait Assistance: Minimal Assistance - Patient > 75% Gait Distance (Feet): 75 Feet Assistive device: Straight cane Gait Assistance Details: Visual cues/gestures for precautions/safety;Other (comment) (verbal directional cues) Gait Gait velocity: decreased Stairs / Additional Locomotion Stairs: Yes Stairs Assistance: Minimal Assistance - Patient > 75% Stair Management Technique: One rail Right Number of Stairs: 4 Wheelchair Mobility Wheelchair Mobility: No  Pt met sitting in recliner w/ OT present after shower. Retrieved manual w/c while OT finished session. Pt oriented x4, confirmed medical hx, home setup, and PLOF. Pt w/ flat affect throughout. Pt educated on therapy process and PT goals. Motor and neuro exam completed as noted above with primary deficits including L hemiplegia UE>LE and L inattention. Sit<>stand from chair to w/c minA w/ SPC requiring VC for safety. Pt mildly impulsive as she immediately stood up when told to transfer to w/c. Pt wheeled to 40M rehab gym for time to complete stairs, ambulation, 5xSTS, and TUG. Outcome measure scores noted below w/ 3 trials each. Pt ambulated ~27f x2 w/ SPC and MinA for safety due to L inattention. Pt completed 4 stairs w/ minA for safety and R railing. Pt fatigued near end of session and demonstrated difficulty w/ clearing L foot during swing phase. Pt wheeled to room and requested to lay in bed for rest. Sit<>stand from w/c to bed minA and modA for leg and trunk support from sit<>supine w/ HOB raised. MaxA x2 for scooting towards HOB. Pt left lying in bed, bed alarm on, call bell in reach, and all needs met.  5xSTS: 26s TUG: 35s  Discharge Criteria: Patient will be discharged from PT if patient refuses treatment 3 consecutive times without medical reason, if  treatment goals not met,  if there is a change in medical status, if patient makes no progress towards goals or if patient is discharged from hospital.  The above assessment, treatment plan, treatment alternatives and goals were discussed and mutually agreed upon: by patient  Lucile Shutters, SPT  08/08/2021, 1:07 PM

## 2021-08-08 NOTE — Progress Notes (Signed)
Occupational Therapy Session Note  Patient Details  Name: Nuha Degner MRN: [id number] Date of Birth: Oct 17, 1945  Today's Date: 08/08/2021 OT Individual Time: 8768-1157 OT Individual Time Calculation (min): 30 min    Short Term Goals: Week 1:     Skilled Therapeutic Interventions/Progress Updates:    Pt greeted semi-reclined in bed, easy to wake and nods head yes to working with OT. Pt needed min A for bed mobility and min A stand-pivot to wc with single point cane.  Pt with lunch on L side of room and had not eaten.Pt unaware that lunch was even in room. OT placed Kinesiotape to L hand for edema management and for attention to L side. OT cut up lunch into bite sized pieces and opened containers for pt. PT left seated in recliner with alarm belt on, call bell in reach, and needs met.   Therapy Documentation Precautions:  Precautions Precautions: Fall, Other (comment) Precaution Comments: Loop recorder; left neglect versus visual field cut Restrictions Weight Bearing Restrictions: No  Pain: Pain Assessment Pain Scale: 0-10 Pain Score: 0-No pain Multiple Pain Sites: No  Therapy/Group: Individual Therapy  Valma Cava 08/08/2021, 3:05 PM

## 2021-08-08 NOTE — Progress Notes (Signed)
Inpatient Rehabilitation  Patient information reviewed and entered into eRehab system by Rupinder Livingston Sorina Derrig, OTR/L.   Information including medical coding, functional ability and quality indicators will be reviewed and updated through discharge.    

## 2021-08-08 NOTE — Progress Notes (Signed)
PROGRESS NOTE   Subjective/Complaints: No new complaints this morning.  She would like to shower.  Assessed loop recorder site, discussed with Kendal Hymen covering this  ROS: denies pain  Objective:   No results found. Recent Labs    08/08/21 0522  WBC 9.5  HGB 13.2  HCT 40.5  PLT 267   Recent Labs    08/08/21 0522  NA 142  K 4.2  CL 112*  CO2 22  GLUCOSE 143*  BUN 26*  CREATININE 1.07*  CALCIUM 9.2   No intake or output data in the 24 hours ending 08/08/21 1045      Physical Exam: Vital Signs Blood pressure (!) 143/64, pulse 80, temperature 98.1 F (36.7 C), resp. rate 14, height 5\' 3"  (1.6 m), weight 93.4 kg, SpO2 92 %. Gen: no distress, normal appearing HEENT: oral mucosa pink and moist, NCAT Cardio: Reg rate Chest: normal effort, normal rate of breathing Abd: soft, non-distended Ext: no edema Psych: pleasant, flat affect Skin: intact Neurological:     Comments: Patient is alert.  Makes eye contact with examiner.  No acute distress.  Follows commands.  Provides name age and date of birth.  Fair insight and awareness.  Strength intact except for LUE which has 3/5 hand grasp. Sensation intact     Assessment/Plan: 1. Functional deficits which require 3+ hours per day of interdisciplinary therapy in a comprehensive inpatient rehab setting. Physiatrist is providing close team supervision and 24 hour management of active medical problems listed below. Physiatrist and rehab team continue to assess barriers to discharge/monitor patient progress toward functional and medical goals  Care Tool:  Bathing              Bathing assist       Upper Body Dressing/Undressing Upper body dressing        Upper body assist Assist Level: Minimal Assistance - Patient > 75%    Lower Body Dressing/Undressing Lower body dressing            Lower body assist Assist for lower body dressing: Minimal  Assistance - Patient > 75%     Toileting Toileting    Toileting assist Assist for toileting: Minimal Assistance - Patient > 75%     Transfers Chair/bed transfer  Transfers assist     Chair/bed transfer assist level: Minimal Assistance - Patient > 75%     Locomotion Ambulation   Ambulation assist              Walk 10 feet activity   Assist           Walk 50 feet activity   Assist           Walk 150 feet activity   Assist           Walk 10 feet on uneven surface  activity   Assist           Wheelchair     Assist               Wheelchair 50 feet with 2 turns activity    Assist            Wheelchair 150 feet activity  Assist          Blood pressure (!) 143/64, pulse 80, temperature 98.1 F (36.7 C), resp. rate 14, height 5\' 3"  (1.6 m), weight 93.4 kg, SpO2 92 %.    dical Problem List and Plan: 1. 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  Status post loop recorder             -may not shower until loop recorder incision has heals             -ELOS/Goals: 5-7 days S  Continue CIR 2.  Antithrombotics: -DVT/anticoagulation:  Mechanical: Antiembolism stockings, thigh (TED hose) Bilateral lower extremities             -antiplatelet therapy: Aspirin 81 mg daily 3. Pain Management: Tylenol needed 4. Mood: Provide emotional support             -antipsychotic agents: N/A 5. Neuropsych: This patient is capable of making decisions on her own behalf. 6. Skin/Wound Care: Routine skin checks 7. Fluids/Electrolytes/Nutrition: Routine in and outs with follow-up chemistries 8.  Hypertension.  Hypotensive, decrease Norvasc to 2.5mg  daily. Monitor with increased mobility 9.  Hyperlipidemia.  Lipitor  10.  Diabetes mellitus.  Hemoglobin A1c 7.4.  Presently on SSI.  Patient on Glucotrol XL 5 mg twice daily, Glucophage 1000 mg twice daily, Farxiga 10 mg daily prior to admission.   Uncontrolled- resume metformin once AKI resolves. Discussed with patient.  11.  Obesity.  BMI 36.49.  Dietary follow-up 12. AKI: placed nursing order to encourage 6-8 glasses of water per day. Discussed with patient. Repeat creatinine tomorrow.  13. Hypoalbuminemia: discussed high blood sugars are likely binding to proteins in her blood stream, advised lo sugar/high protein diet.   LOS: 1 days A FACE TO FACE EVALUATION WAS PERFORMED  Junious Ragone P Ellasyn Swilling 08/08/2021, 10:45 AM

## 2021-08-09 LAB — BASIC METABOLIC PANEL
Anion gap: 8 (ref 5–15)
BUN: 23 mg/dL (ref 8–23)
CO2: 22 mmol/L (ref 22–32)
Calcium: 9.3 mg/dL (ref 8.9–10.3)
Chloride: 110 mmol/L (ref 98–111)
Creatinine, Ser: 1.05 mg/dL — ABNORMAL HIGH (ref 0.44–1.00)
GFR, Estimated: 55 mL/min — ABNORMAL LOW (ref >60)
Glucose, Bld: 147 mg/dL — ABNORMAL HIGH (ref 70–99)
Potassium: 3.9 mmol/L (ref 3.5–5.1)
Sodium: 140 mmol/L (ref 135–145)

## 2021-08-09 LAB — GLUCOSE, CAPILLARY
Glucose-Capillary: 150 mg/dL — ABNORMAL HIGH (ref 70–99)
Glucose-Capillary: 137 mg/dL — ABNORMAL HIGH (ref 70–99)
Glucose-Capillary: 173 mg/dL — ABNORMAL HIGH (ref 70–99)
Glucose-Capillary: 157 mg/dL — ABNORMAL HIGH (ref 70–99)

## 2021-08-09 MED ORDER — POTASSIUM CHLORIDE CRYS ER 10 MEQ PO TBCR
10.0000 meq | EXTENDED_RELEASE_TABLET | Freq: Once | ORAL | Status: AC
  Administered 2021-08-09: 10 meq via ORAL
  Filled 2021-08-09: qty 1

## 2021-08-09 MED ORDER — SODIUM CHLORIDE 0.9 % IV SOLN: INTRAVENOUS | Status: AC

## 2021-08-09 NOTE — Progress Notes (Signed)
Patient placed on her home CPAP machine for HS.  Room air used.  Nasal mask.  Patient is familiar with equipment and procedure.

## 2021-08-09 NOTE — Progress Notes (Addendum)
Occupational Therapy Session Note  Patient Details  Name: Alyssa Mayo MRN: [id number] Date of Birth: [birth year]  Today's Date: 08/09/2021 OT Individual Time: 0900-1000 OT Individual Time Calculation (min): 60 min    Short Term Goals: Week 1:  OT Short Term Goal 1 (Week 1): STGs=LTGs due to ELOS  Skilled Therapeutic Interventions/Progress Updates:    Pt supine in bed, awake, appearing a bit more flat and slower to answer simple questions and follow commands. Pt politely refusing changing clothes or showering but agreeing she would like to brush her hair.  Pt completed right roll and sidelying to sit with min assist and max step by step multimodal cues for body mechanics.  Pt completed sit to stand with CGA and increased time to initiate then requesting to sit in recliner.  Reminded pt original plan was to brush hair at sink and pt nodded head and said "oh yeah".  Pt then walked by sink and initiated sitting in w/c slightly impulsively and OT needing to rush to position and lock brakes for safety.  Pt transported to ortho gym and attempted UBE with no resistance, left hand gently supported with ace wrap.  Pt reporting too much pain in LUE 8/10 near proximal forearm medial/volar aspect.  Discontinued and attempted gentle PROM to digits and wrist (which are WNL ) however, again, pt wincing in pain and same spot indicated.  Assessed LUE and noted slight increased warmth to touch over area of pain compared to RUE however no redness or increased swelling noted. Pt returned to room and nurse made aware.  Pt requesting to use bathroom and completed sit to stand and ambulated to bathroom using RW with min assist overall.  Toilet transfer and toileting completed with min assist.  Ambulated back to recliner and stand to sit with min assist.  Call bell in reach, pillow elevated LUE, seat alarm on.  Therapy Documentation Precautions:  Precautions Precautions: Fall, Other (comment) Precaution Comments:  Loop recorder; left neglect versus visual field cut Restrictions Weight Bearing Restrictions: No    Therapy/Group: Individual Therapy  Amie Critchley 08/09/2021, 12:40 PM

## 2021-08-09 NOTE — Progress Notes (Signed)
Speech Language Pathology Daily Session Note  Patient Details  Name: Alyssa Mayo MRN: [id number] Date of Birth: 04-30-1946  Today's Date: 08/09/2021 SLP Individual Time: 1300-1355 SLP Individual Time Calculation (min): 55 min  Short Term Goals: Week 1: SLP Short Term Goal 1 (Week 1): STG = LTG d/t ELOS  Skilled Therapeutic Interventions: Skilled treatment session focused on cognitive goals. Upon arrival, patient was awake while supine in bed. Patient had not eaten her lunch but did report she knew it was there. Patient requested to get into the recliner to her lunch. SLP facilitated session by providing more than a reasonable amount of time and overall Max A verbal cues for patient to initiate sitting EOB. Patient was transferred to the recliner with Mod-Max verbal cues for safety with RW due to decreased attention to her LUE. Patient required more than a reasonable amount of time for self-feeding with overall Max verbal cues needed for sustained attention to task. SLP engaged patient in basic conversation that focused on orientation and awareness of deficits with extra time and intermittent repetition needed due to decreased initiation with verbal expression. Patient required Max verbal cues for intellectual awareness of cognitive deficits. Patient left upright in the recliner with alarm on and all needs within reach. Continue with current plan of care.      Pain No/Denies Pain   Therapy/Group: Individual Therapy  Anil Havard 08/09/2021, 3:34 PM

## 2021-08-09 NOTE — Progress Notes (Signed)
Physical Therapy Session Note  Patient Details  Name: Alyssa Mayo MRN: [id number] Date of Birth: 1945/07/24  Today's Date: 08/09/2021 PT Individual Time: 1020-1044 PT Individual Time Calculation (min): 24 min  and Today's Date: 08/09/2021 PT Missed Time: 21 Minutes Missed Time Reason: Patient fatigue;Pain;Patient unwilling to participate  Short Term Goals: Week 1:  PT Short Term Goal 1 (Week 1): Pt will complete supine<>sit transfer w/ S. PT Short Term Goal 2 (Week 1): Pt will transfer bed to chair w/ LRAD w/ S. PT Short Term Goal 3 (Week 1): Pt will ambulate 150 ft w/ LRAD w/ S. PT Short Term Goal 4 (Week 1): Pt will complete 4 steps w/ R hand rail w/ S  Skilled Therapeutic Interventions/Progress Updates:  Pt received seated in WC in room, very lethargic and flat affect throughout session. Pt reported 7/10 pain in LUE and requested pain meds, nursing notified. Offered repositioning and change of scenery for pain modulation. Max encouragement for pt to participate, but pt adamant about getting into bed due to LUE pain and fatigue. Pt did request to go outside during later session, primary PT notified. Pt performed sit <>stand from recliner to [facility] and performed stand pivot to bed w/min A for LUE management. Sit <>supine w/CGA and pt was left side-lying on R side, LUE propped on pillow, all needs in reach. Missed 21 minutes of skilled PT due to pain, fatigue and pt refusal.   Therapy Documentation Precautions:  Precautions Precautions: Fall, Other (comment) Precaution Comments: Loop recorder; left neglect versus visual field cut Restrictions Weight Bearing Restrictions: No   Therapy/Group: Individual Therapy Jill Alexanders Mae Cianci, PT, DPT  08/09/2021, 7:49 AM

## 2021-08-09 NOTE — Progress Notes (Signed)
PROGRESS NOTE   Subjective/Complaints: No new complaints this morning. Sleepy Cr slightly elevated still- will start overnight fluids and recheck tomorrow.   ROS: +LUE pain  Objective:   No results found. Recent Labs    08/08/21 0522  WBC 9.5  HGB 13.2  HCT 40.5  PLT 267   Recent Labs    08/08/21 0522 08/09/21 0524  NA 142 140  K 4.2 3.9  CL 112* 110  CO2 22 22  GLUCOSE 143* 147*  BUN 26* 23  CREATININE 1.07* 1.05*  CALCIUM 9.2 9.3    Intake/Output Summary (Last 24 hours) at 08/09/2021 1216 Last data filed at 08/08/2021 1500 Gross per 24 hour  Intake 60 ml  Output --  Net 60 ml        Physical Exam: Vital Signs Blood pressure (!) 143/55, pulse 84, temperature (!) 97.4 F (36.3 C), resp. rate 16, height 5\' 3"  (1.6 m), weight 93.4 kg, SpO2 96 %. Gen: no distress, normal appearing HEENT: oral mucosa pink and moist, NCAT Cardio: Reg rate Chest: normal effort, normal rate of breathing Abd: soft, non-distended Ext: no edema Psych: pleasant, flat affect Skin: intact Neurological/MSK     Comments: Patient is alert.  Makes eye contact with examiner.  No acute distress.  Follows commands.  Provides name age and date of birth.  Fair insight and awareness.  Strength intact except for LUE which has 3/5 hand grasp. Sensation intact. LUE tenderness.     Assessment/Plan: 1. Functional deficits which require 3+ hours per day of interdisciplinary therapy in a comprehensive inpatient rehab setting. Physiatrist is providing close team supervision and 24 hour management of active medical problems listed below. Physiatrist and rehab team continue to assess barriers to discharge/monitor patient progress toward functional and medical goals  Care Tool:  Bathing              Bathing assist Assist Level: Minimal Assistance - Patient > 75%     Upper Body Dressing/Undressing Upper body dressing        Upper  body assist Assist Level: Minimal Assistance - Patient > 75%    Lower Body Dressing/Undressing Lower body dressing            Lower body assist Assist for lower body dressing: Minimal Assistance - Patient > 75%     Toileting Toileting    Toileting assist Assist for toileting: Minimal Assistance - Patient > 75%     Transfers Chair/bed transfer  Transfers assist     Chair/bed transfer assist level: Minimal Assistance - Patient > 75%     Locomotion Ambulation   Ambulation assist      Assist level: Minimal Assistance - Patient > 75% Assistive device: Cane-straight Max distance: 17ft   Walk 10 feet activity   Assist     Assist level: Minimal Assistance - Patient > 75% Assistive device: Cane-straight   Walk 50 feet activity   Assist    Assist level: Minimal Assistance - Patient > 75% Assistive device: Cane-straight    Walk 150 feet activity   Assist Walk 150 feet activity did not occur: Safety/medical concerns         Walk 10  feet on uneven surface  activity   Assist Walk 10 feet on uneven surfaces activity did not occur: Safety/medical concerns         Wheelchair     Assist Is the patient using a wheelchair?: No   Wheelchair activity did not occur: N/A         Wheelchair 50 feet with 2 turns activity    Assist    Wheelchair 50 feet with 2 turns activity did not occur: N/A       Wheelchair 150 feet activity     Assist  Wheelchair 150 feet activity did not occur: N/A       Blood pressure (!) 143/55, pulse 84, temperature (!) 97.4 F (36.3 C), resp. rate 16, height 5\' 3"  (1.6 m), weight 93.4 kg, SpO2 96 %.    dical Problem List and Plan: 1. 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  Status post loop recorder             -may not shower until loop recorder incision has heals             -ELOS/Goals: 5-7 days S  Continue CIR  Start B/C vitamin complex to aid stroke  recovery.  2.  Antithrombotics: -DVT/anticoagulation:  Mechanical: Antiembolism stockings, thigh (TED hose) Bilateral lower extremities             -antiplatelet therapy: Aspirin 81 mg daily 3. LUE pain: Tylenol needed. Add kpad 4. Mood: Provide emotional support             -antipsychotic agents: N/A 5. Neuropsych: This patient is capable of making decisions on her own behalf. 6. Skin/Wound Care: Routine skin checks 7. Fluids/Electrolytes/Nutrition: Routine in and outs with follow-up chemistries 8.  Hypertension.  Hypotensive, decrease Norvasc to 2.5mg  daily. Monitor with increased mobility 9.  Hyperlipidemia.  Lipitor  10.  Diabetes mellitus.  Hemoglobin A1c 7.4.  Presently on SSI.  Patient on Glucotrol XL 5 mg twice daily, Glucophage 1000 mg twice daily, Farxiga 10 mg daily prior to admission.  Uncontrolled- resume metformin once AKI resolves. Discussed with patient.  11.  Obesity.  BMI 36.49.  Dietary follow-up 12. AKI: placed nursing order to encourage 6-8 glasses of water per day. Discussed with patient. Start IV fluids overnight. Repeat creatinine tomorrow.  13. Hypoalbuminemia: discussed high blood sugars are likely binding to proteins in her blood stream, advised lo sugar/high protein diet.  14. Hypokalemia: supplement 1/20.   LOS: 2 days A FACE TO FACE EVALUATION WAS PERFORMED  Clide Deutscher Shaelin Lalley 08/09/2021, 12:16 PM

## 2021-08-09 NOTE — Progress Notes (Addendum)
Physical Therapy Session Note  Patient Details  Name: Alyssa Mayo MRN: [id number] Date of Birth: Dec 12, 1945  Today's Date: 08/09/2021 PT Individual Time: 1400-1457 PT Individual Time Calculation (min): 57 min   Short Term Goals: Week 1:  PT Short Term Goal 1 (Week 1): Pt will complete supine<>sit transfer w/ S. PT Short Term Goal 2 (Week 1): Pt will transfer bed to chair w/ LRAD w/ S. PT Short Term Goal 3 (Week 1): Pt will ambulate 150 ft w/ LRAD w/ S. PT Short Term Goal 4 (Week 1): Pt will complete 4 steps w/ R hand rail w/ S  Skilled Therapeutic Interventions/Progress Updates:     Pt greeted seated in recliner - agreeable to PT tx but pt has flat affect throughout session. Reports some L wrist pain that the nurse is aware of. Provided distraction, mobility, and repositioning for pain management. Pt requesting to go outside for fresh air. Completed ambulatory transfer with [facility] and minA from recliner to w/c - pt somewhat impulsive in standing prior to being instructed - provided gentle reminders of safety awareness of fall prevention. Pt transported outside in w/c for time and energy conservation. While outdoors, worked on outdoor Investment banker, operational on paved surfaces with minA and SPC - ambulating variable distances ~154ft + ~131ft. Also worked on visual scanning to L to identify objects as pt confirms L peripheral field deficit. Worked on [employer] outdoors and incorporating L turns to promote L attention. She required directional cues and tactile cues to turn L while completing obstacle course. Pt returned upstairs in w/c to her main rehab gym where she completed the BERG balance test. See below for details.  Patient demonstrates increased fall risk as noted by score of 33/56 on Berg Balance Scale.  (<36= high risk for falls, close to 100%; 37-45 significant >80%; 46-51 moderate >50%; 52-55 lower >25%)  Pt educated on her score with BERG and how it relates to functional falls risk which  she voiced understanding. Pt returned to her room at conclusion of session in her w/c. Assisted to recliner with minA stand pivot transfer. Her son was in the room who was updated on PT POC, PT goals, and some of her deficits related to CVA (L sided weakness, L inattention, ?L visual deficits). Pt with safety belt alarm on, all needs within reach.   *of note - pt with some S.O.B with light activity during session. SpO2 >95% however HR remained elevated ranging from 102-125bpm. With 2-3 minute rest break, HR remained ~100bpm and pt reporting this is "normal" for her. LPN notified at end of session regarding elevated HR.   Therapy Documentation Precautions:  Precautions Precautions: Fall, Other (comment) Precaution Comments: Loop recorder; left neglect versus visual field cut Restrictions Weight Bearing Restrictions: No  Balance: Balance Balance Assessed: Yes Standardized Balance Assessment Standardized Balance Assessment: Berg Balance Test Berg Balance Test Sit to Stand: Able to stand without using hands and stabilize independently Standing Unsupported: Able to stand 2 minutes with supervision Sitting with Back Unsupported but Feet Supported on Floor or Stool: Able to sit safely and securely 2 minutes Stand to Sit: Sits safely with minimal use of hands Transfers: Able to transfer safely, definite need of hands Standing Unsupported with Eyes Closed: Able to stand 10 seconds with supervision Standing Ubsupported with Feet Together: Able to place feet together independently but unable to hold for 30 seconds From Standing, Reach Forward with Outstretched Arm: Reaches forward but needs supervision From Standing Position, Pick up Object  from Floor: Able to pick up shoe, needs supervision From Standing Position, Turn to Look Behind Over each Shoulder: Looks behind one side only/other side shows less weight shift Turn 360 Degrees: Needs close supervision or verbal cueing Standing Unsupported,  Alternately Place Feet on Step/Stool: Able to complete >2 steps/needs minimal assist Standing Unsupported, One Foot in Front: Needs help to step but can hold 15 seconds Standing on One Leg: Unable to try or needs assist to prevent fall Total Score: 33 Exercises:   Other Treatments:      Therapy/Group: Individual Therapy  Orrin Brigham 08/09/2021, 2:47 PM

## 2021-08-10 LAB — GLUCOSE, CAPILLARY
Glucose-Capillary: 148 mg/dL — ABNORMAL HIGH (ref 70–99)
Glucose-Capillary: 199 mg/dL — ABNORMAL HIGH (ref 70–99)
Glucose-Capillary: 191 mg/dL — ABNORMAL HIGH (ref 70–99)
Glucose-Capillary: 188 mg/dL — ABNORMAL HIGH (ref 70–99)

## 2021-08-10 LAB — BASIC METABOLIC PANEL
Anion gap: 9 (ref 5–15)
BUN: 23 mg/dL (ref 8–23)
CO2: 22 mmol/L (ref 22–32)
Calcium: 9 mg/dL (ref 8.9–10.3)
Chloride: 109 mmol/L (ref 98–111)
Creatinine, Ser: 1.05 mg/dL — ABNORMAL HIGH (ref 0.44–1.00)
GFR, Estimated: 55 mL/min — ABNORMAL LOW (ref >60)
Glucose, Bld: 147 mg/dL — ABNORMAL HIGH (ref 70–99)
Potassium: 3.9 mmol/L (ref 3.5–5.1)
Sodium: 140 mmol/L (ref 135–145)

## 2021-08-10 MED ORDER — SODIUM CHLORIDE 0.9 % IV SOLN: INTRAVENOUS | Status: DC

## 2021-08-10 MED ORDER — POTASSIUM CHLORIDE CRYS ER 10 MEQ PO TBCR
10.0000 meq | EXTENDED_RELEASE_TABLET | Freq: Once | ORAL | Status: AC
  Administered 2021-08-10: 10 meq via ORAL
  Filled 2021-08-10: qty 1

## 2021-08-10 MED ORDER — VITAMIN D (ERGOCALCIFEROL) 1.25 MG (50000 UNIT) PO CAPS
50000.0000 [IU] | ORAL_CAPSULE | ORAL | Status: DC
  Administered 2021-08-10 – 2021-08-17 (×2): 50000 [IU] via ORAL
  Filled 2021-08-10 (×2): qty 1

## 2021-08-10 MED ORDER — PANTOPRAZOLE SODIUM 20 MG PO TBEC
20.0000 mg | DELAYED_RELEASE_TABLET | Freq: Every day | ORAL | Status: DC
  Administered 2021-08-11 – 2021-08-22 (×12): 20 mg via ORAL
  Filled 2021-08-10 (×12): qty 1

## 2021-08-10 NOTE — IPOC Note (Signed)
Overall Plan of Care [facility]) Patient Details Name: Alyssa Mayo MRN: [id number] DOB: 15-Oct-1945  Admitting Diagnosis: CVA (cerebral vascular accident) [facility])  Hospital Problems: Principal Problem:   CVA (cerebral vascular accident) [facility]) Active Problems:   Right middle cerebral artery stroke [facility])     Functional Problem List: Nursing Bladder, Bowel, Endurance, Pain, Medication Management, Safety  PT Motor, Endurance, Balance, Safety  OT Balance, Perception, Cognition, Sensory, Edema, Skin Integrity, Endurance, Vision, Motor  SLP Cognition, Safety  TR         Basic ADLs: OT Grooming, Bathing, Dressing, Toileting     Advanced  ADLs: OT       Transfers: PT Bed Mobility, Bed to Chair, Customer service manager, Tub/Shower     Locomotion: PT Ambulation, Stairs     Additional Impairments: OT Fuctional Use of Upper Extremity  SLP Social Cognition   Problem Solving, Awareness  TR      Anticipated Outcomes Item Anticipated Outcome  Self Feeding    Swallowing      Basic self-care  mod I  Toileting  mod I   Bathroom Transfers mod I  Bowel/Bladder  manage bowel with mod I assist  Transfers  mod I  Locomotion  mod I  Communication     Cognition  Supervision A  Pain  pain at or below level 4 with prn meds  Safety/Judgment  maintain safety with cues   Therapy Plan: PT Intensity: Minimum of 1-2 x/day ,45 to 90 minutes PT Frequency: 5 out of 7 days PT Duration Estimated Length of Stay: 7-10 days OT Intensity: Minimum of 1-2 x/day, 45 to 90 minutes OT Frequency: 5 out of 7 days OT Duration/Estimated Length of Stay: 7-10 days SLP Intensity: Minumum of 1-2 x/day, 30 to 90 minutes SLP Frequency: 3 to 5 out of 7 days SLP Duration/Estimated Length of Stay: 7-10 days   Due to the current state of emergency, patients may not be receiving their 3-hours of Medicare-mandated therapy.   Team Interventions: Nursing Interventions Disease Management/Prevention, Bowel  Management, Pain Management, Patient/Family Education, Discharge Planning, Medication Management  PT interventions Ambulation/gait training, DME/adaptive equipment instruction, Neuromuscular re-education, Stair training, UE/LE Strength taining/ROM, UE/LE Coordination activities, Wheelchair propulsion/positioning, Therapeutic Exercise, Therapeutic Activities, Patient/family education, Pain management, Discharge planning, Balance/vestibular training, Cognitive remediation/compensation, Disease management/prevention, Functional mobility training, Visual/perceptual remediation/compensation  OT Interventions Balance/vestibular training, Discharge planning, Functional electrical stimulation, Pain management, Self Care/advanced ADL retraining, Therapeutic Activities, UE/LE Coordination activities, Cognitive remediation/compensation, Disease mangement/prevention, Functional mobility training, Patient/family education, Skin care/wound managment, Therapeutic Exercise, Visual/perceptual remediation/compensation, Community reintegration, Fish farm manager, Neuromuscular re-education, Psychosocial support, Splinting/orthotics, UE/LE Strength taining/ROM, Wheelchair propulsion/positioning  SLP Interventions Cognitive remediation/compensation, Therapeutic Activities, Therapeutic Exercise, Patient/family education, Functional tasks, Cueing hierarchy  TR Interventions    SW/CM Interventions Discharge Planning, Psychosocial Support, Patient/Family Education   Barriers to Discharge MD  Medical stability  Nursing Decreased caregiver support, Home environment access/layout 2 level 4ste , main B+B with son  PT Home environment access/layout 4 stairs to enter, first floor setup  OT      SLP      SW Decreased caregiver support, Community education officer for SNF coverage     Team Discharge Planning: Destination: PT-Home ,OT- Home , SLP-Home Projected Follow-up: PT-Outpatient PT, OT-  Other (comment), Outpatient OT  (intemittent supervision for IADLs), SLP-24 hour supervision/assistance Projected Equipment Needs: PT-To be determined, OT- To be determined, SLP-None recommended by SLP Equipment Details: PT- , OT-  Patient/family involved in discharge planning: PT- Patient,  OT-Patient, SLP-Patient  MD ELOS: 5-7 days Medical Rehab Prognosis:  Excellent Assessment: Alyssa Mayo is a 76 year old woman admitted to CIR with functional deficits secondary to right MCA infarct with hemorrhagic transformation. Status post loop recorder. Medications are being managed, and labs and vitals are being monitored regularly.     See Team Conference Notes for weekly updates to the plan of care

## 2021-08-10 NOTE — Progress Notes (Signed)
Speech Language Pathology Daily Session Note  Patient Details  Name: Alyssa Mayo MRN: [id number] Date of Birth: 22-Feb-1946  Today's Date: 08/10/2021 SLP Individual Time: 2671-2458 SLP Individual Time Calculation (min): 42 min  Short Term Goals: Week 1: SLP Short Term Goal 1 (Week 1): STG = LTG d/t ELOS  Skilled Therapeutic Interventions: Pt seen for skilled ST with focus on cognitive goals, received from OT session after a shower. Pt complaining of consistent nose running and increase in saliva, discussed oral management of saliva may have changed as a result of CVA. Pt did demonstrate some dysarthric speech today at conversation level, not noted during eval due to minimal expressive language/fatigue. Pt agrees speech sounds slurred, partially due to excess saliva. Will add goal for speech intelligibility/compensatory strategies. SLP facilitating moderately complex problem solving task with overall min A verbal cues for accuracy. At this point, pt reporting some fatigue and requesting to transfer to bed. SLP facilitating transfer by providing min A verbal cues for safety and sequencing. Once patient in bed, covered with blankets and requesting to sleep. Pt left in bed with alarm set and all needs within reach. Cont ST POC.   Pain Pain Assessment Pain Scale: 0-10 Pain Score: 0-No pain  Therapy/Group: Individual Therapy  Tacey Ruiz 08/10/2021, 3:11 PM

## 2021-08-10 NOTE — Progress Notes (Signed)
Physical Therapy Session Note  Patient Details  Name: Alyssa Mayo MRN: [id number] Date of Birth: 06-27-1946  Today's Date: 08/10/2021 PT Individual Time: 1012-1109 PT Individual Time Calculation (min): 57 min   Short Term Goals: Week 1:  PT Short Term Goal 1 (Week 1): Pt will complete supine<>sit transfer w/ S. PT Short Term Goal 2 (Week 1): Pt will transfer bed to chair w/ LRAD w/ S. PT Short Term Goal 3 (Week 1): Pt will ambulate 150 ft w/ LRAD w/ S. PT Short Term Goal 4 (Week 1): Pt will complete 4 steps w/ R hand rail w/ S  Skilled Therapeutic Interventions/Progress Updates:    Pt received supine in bed and agreeable to therapy session - continues to have flat affect but often enjoys joking during session. Pt continues to have impulsivity requiring cuing to wait for therapist prior to standing. Supine>sitting L EOB, HOB slightly elevated and relying on bedrail, with heavy mod assist for bringing trunk upright. Sit<>stand to/from EOB using SPC in R UE with CGA. Sitting EOB, doffed pants min assist and threaded on clean pants min assist. Gait to sink using SPC in RUE with CGA - brushed hair and teeth without assist; however, demos L inattention requiring increased time to locate items on L side.  Gait ~176ft to therapy gym using SPC in R UE with CGA for steadying -  pt very internally and externally distracted, stopping to joke with nurse at the nurses station and look at news paper - requires repeat cuing to attend to task and turn L towards gym - demos slow gait speed though achieving reciprocal stepping pattern with adequate L LE foot clearance.  Stair navigation training ascending/descending 4 steps x2 (6" height) using L UE support on each HR with reciprocal pattern on ascent and step-to leading with R LE on descent with no L knee instability noted - cuing to fully step L foot up onto step during ascent - light min assist.  Dynamic gait training, L UE NMR, and L attention task via  locating numbered disks in ascending order (1-10) in hallway and grasping them with L hand - CGA for steadying/safety throughout - requires increased time to attend to L side and find all of the disks and x3 cues to recall what number she is currently searching for as pt will just randomly go to pick up a number, but is accurately able to state the number she should be looking for.  Pt requesting to weigh herself. Gait training ~257ft to dayroom then pt's room using SPC in R UE with CGA as noted above. Stepped on/off scale using SPC support with CGA. Pt's weight updated in chart. At end of session, pt left seated in recliner with needs in reach and seat belt alarm on.  SpO2 and HR monitored throughout: SpO2 maintained 94% or higher and HR ranged from 96bpm - 115bpm - pt reports her HR at baseline runs in 90bpms  Therapy Documentation Precautions:  Precautions Precautions: Fall, Other (comment) Precaution Comments: Loop recorder; left neglect versus visual field cut Restrictions Weight Bearing Restrictions: No   Pain:  No reports of pain throughout session.    Therapy/Group: Individual Therapy  Ginny Forth , PT, DPT, NCS, CSRS  08/10/2021, 7:50 AM

## 2021-08-10 NOTE — Progress Notes (Signed)
Occupational Therapy Session Note  Patient Details  Name: Louwana Gilgen MRN: [id number] Date of Birth: 11-17-1945  Today's Date: 08/10/2021 OT Individual Time: [id number] OT Individual Time Calculation (min): 29 min    Short Term Goals: Week 1:  OT Short Term Goal 1 (Week 1): STGs=LTGs due to ELOS  Skilled Therapeutic Interventions/Progress Updates:    Pt in recliner to start session with completion of functional mobility down to the dayroom with use of her SPC and min assist.  She needed cueing to scan to the left to locate the high low table in order to move to the center of it and sit down in a chair.  Had her work on LUE coordination in sitting with use of the large 1" X 3" wooden pegs and board.  She was able to work on picking up the pegs from a container with the LUE and placing in the board.  Frequent drops noted with decreased ability to manipulate and hold the pegs was noted.  Had her also work on taking the pegs out of the board and placing back in the container placed at different heights on the left side.  Finished session with ambulation back to her room with mod demonstrational cueing to scan left to find the entrance on the left side of the dayroom.  Therapist had to take and actually turn her head further left to see it secondary to neglect and visual field deficit.  Returned to her recliner in the room with min assist where she was left with the call button and phone in reach and safety alarm belt in place.    Therapy Documentation Precautions:  Precautions Precautions: Fall, Other (comment) Precaution Comments: Loop recorder; left neglect versus visual field cut Restrictions Weight Bearing Restrictions: No  Pain: Pain Assessment Pain Scale: 0-10 Pain Score: 0-No pain ADL: See Care Tool Section for some details of mobility and selfcare   Therapy/Group: Individual Therapy  Izrael Peak OTR/L 08/10/2021, 4:46 PM

## 2021-08-10 NOTE — Plan of Care (Signed)
°  Problem: RH Expression Communication Goal: LTG Patient will increase speech intelligibility (SLP) Description: LTG: Patient will increase speech intelligibility at word/phrase/conversation level with cues, % of the time (SLP) Flowsheets (Taken 08/10/2021 1523) LTG: Patient will increase speech intelligibility (SLP): Supervision Level: Conversation level

## 2021-08-10 NOTE — Progress Notes (Signed)
PROGRESS NOTE   Subjective/Complaints: No new complaints this morning Discussed continued IVF at night to help improve her kidney function and she is agreeable Tolerating therapy well.   ROS: +LUE pain, +left sided neglect  Objective:   No results found. Recent Labs    08/08/21 0522  WBC 9.5  HGB 13.2  HCT 40.5  PLT 267   Recent Labs    08/09/21 0524 08/10/21 0511  NA 140 140  K 3.9 3.9  CL 110 109  CO2 22 22  GLUCOSE 147* 147*  BUN 23 23  CREATININE 1.05* 1.05*  CALCIUM 9.3 9.0    Intake/Output Summary (Last 24 hours) at 08/10/2021 1935 Last data filed at 08/10/2021 1800 Gross per 24 hour  Intake 1200.35 ml  Output --  Net 1200.35 ml        Physical Exam: Vital Signs Blood pressure (!) 131/59, pulse 78, temperature 98.6 F (37 C), temperature source Oral, resp. rate 17, height 5\' 3"  (1.6 m), weight 92.4 kg, SpO2 96 %. Gen: no distress, normal appearing HEENT: oral mucosa pink and moist, NCAT Cardio: Reg rate Chest: normal effort, normal rate of breathing Abd: soft, non-distended Ext: no edema Psych: pleasant, flat affect Skin: intact Neurological/MSK     Comments: Patient is alert.  Makes eye contact with examiner.  No acute distress.  Follows commands.  Provides name age and date of birth.  Fair insight and awareness.  Strength intact except for LUE which has 3/5 hand grasp. Sensation intact. LUE tenderness. Left sided neglect     Assessment/Plan: 1. Functional deficits which require 3+ hours per day of interdisciplinary therapy in a comprehensive inpatient rehab setting. Physiatrist is providing close team supervision and 24 hour management of active medical problems listed below. Physiatrist and rehab team continue to assess barriers to discharge/monitor patient progress toward functional and medical goals  Care Tool:  Bathing              Bathing assist Assist Level: Minimal  Assistance - Patient > 75%     Upper Body Dressing/Undressing Upper body dressing        Upper body assist Assist Level: Minimal Assistance - Patient > 75%    Lower Body Dressing/Undressing Lower body dressing            Lower body assist Assist for lower body dressing: Minimal Assistance - Patient > 75%     Toileting Toileting    Toileting assist Assist for toileting: Minimal Assistance - Patient > 75%     Transfers Chair/bed transfer  Transfers assist     Chair/bed transfer assist level: Contact Guard/Touching assist Chair/bed transfer assistive device:   Ambulation assist      Assist level: Contact Guard/Touching assist Assistive device: Cane-straight Max distance: 253ft   Walk 10 feet activity   Assist     Assist level: Minimal Assistance - Patient > 75% Assistive device: Cane-straight   Walk 50 feet activity   Assist    Assist level: Minimal Assistance - Patient > 75% Assistive device: Cane-straight    Walk 150 feet activity   Assist Walk 150 feet activity did not occur: Safety/medical  concerns         Walk 10 feet on uneven surface  activity   Assist Walk 10 feet on uneven surfaces activity did not occur: Safety/medical concerns         Wheelchair     Assist Is the patient using a wheelchair?: No   Wheelchair activity did not occur: N/A         Wheelchair 50 feet with 2 turns activity    Assist    Wheelchair 50 feet with 2 turns activity did not occur: N/A       Wheelchair 150 feet activity     Assist  Wheelchair 150 feet activity did not occur: N/A       Blood pressure (!) 131/59, pulse 78, temperature 98.6 F (37 C), temperature source Oral, resp. rate 17, height 5\' 3"  (1.6 m), weight 92.4 kg, SpO2 96 %.    dical Problem List and Plan: 1. 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  Status post loop recorder              -may not shower until loop recorder incision has heals             -ELOS/Goals: 5-7 days S  Continue CIR  Start B/C vitamin complex to aid stroke recovery.  2.  Antithrombotics: -DVT/anticoagulation:  Mechanical: Antiembolism stockings, thigh (TED hose) Bilateral lower extremities             -antiplatelet therapy: Aspirin 81 mg daily 3. LUE pain: Tylenol needed. Add kpad 4. Mood: Provide emotional support             -antipsychotic agents: N/A 5. Neuropsych: This patient is capable of making decisions on her own behalf. 6. Skin/Wound Care: Routine skin checks 7. Fluids/Electrolytes/Nutrition: Routine in and outs with follow-up chemistries 8.  Hypertension.  Hypotensive, decrease Norvasc to 2.5mg  daily. Monitor with increased mobility 9.  Hyperlipidemia.  Lipitor  10.  Diabetes mellitus.  Hemoglobin A1c 7.4.  Presently on SSI.  Patient on Glucotrol XL 5 mg twice daily, Glucophage 1000 mg twice daily, Farxiga 10 mg daily prior to admission.  Uncontrolled- resume metformin once AKI resolves. Discussed with patient.  11.  Obesity.  BMI 36.49.  Dietary follow-up 12. AKI: placed nursing order to encourage 6-8 glasses of water per day. Discussed with patient. Start IV fluids overnight. Repeat creatinine tomorrow.  13. Hypoalbuminemia: discussed high blood sugars are likely binding to proteins in her blood stream, advised lo sugar/high protein diet.  14. Hypokalemia: supplement 1/21, repeat tomorrow.  15. Suboptimal Vitamin D: start ergocalciferol 50,000U once per week for 7 weeks.  16. Polypharmacy: wean Protonix to 20mg  since no apparent indication.   LOS: 3 days A FACE TO FACE EVALUATION WAS PERFORMED  2/21 08/10/2021, 7:35 PM

## 2021-08-10 NOTE — Progress Notes (Signed)
Occupational Therapy Session Note  Patient Details  Name: Alyssa Mayo MRN: [id number] Date of Birth: [birth year]  Today's Date: 08/10/2021 OT Individual Time: 1410-1436 OT Individual Time Calculation (min): 26 min    Short Term Goals: Week 1:  OT Short Term Goal 1 (Week 1): STGs=LTGs due to ELOS  Skilled Therapeutic Interventions/Progress Updates:    Pt received seated on toilet, no c/o pain, agreeable to therapy. Session focus on self-care retraining, activity tolerance, transfer retraining in prep for improved ADL/IADL/func mobility performance + decreased caregiver burden. Pt had continent void of bowel and completed pericare, mod A to pull up pants. Close S to wash hands at sink. Completed TTB transfer with RW and CGA due to pushing RW away. Completed UB bathing seated with S, anterior pericare and upper BLE with S, as well. Declind further bathing. Required heavy assist to manage shower items and water due to frequently dropping items. Donned night gown with mod A to thread LUE and pull down over head and donned underwear with mod A to pull up over hips.  Pt left seated in recliner with SLP present with call bell in reach, and all immediate needs met.    Therapy Documentation Precautions:  Precautions Precautions: Fall, Other (comment) Precaution Comments: Loop recorder; left neglect versus visual field cut Restrictions Weight Bearing Restrictions: No  Pain: no c/o   ADL: See Care Tool for more details.   Therapy/Group: Individual Therapy  Volanda Napoleon MS, OTR/L  08/10/2021, 6:48 AM

## 2021-08-11 LAB — GLUCOSE, CAPILLARY
Glucose-Capillary: 145 mg/dL — ABNORMAL HIGH (ref 70–99)
Glucose-Capillary: 158 mg/dL — ABNORMAL HIGH (ref 70–99)
Glucose-Capillary: 177 mg/dL — ABNORMAL HIGH (ref 70–99)
Glucose-Capillary: 195 mg/dL — ABNORMAL HIGH (ref 70–99)
Glucose-Capillary: 211 mg/dL — ABNORMAL HIGH (ref 70–99)

## 2021-08-11 LAB — BASIC METABOLIC PANEL
Anion gap: 8 (ref 5–15)
BUN: 19 mg/dL (ref 8–23)
CO2: 20 mmol/L — ABNORMAL LOW (ref 22–32)
Calcium: 8.8 mg/dL — ABNORMAL LOW (ref 8.9–10.3)
Chloride: 112 mmol/L — ABNORMAL HIGH (ref 98–111)
Creatinine, Ser: 1 mg/dL (ref 0.44–1.00)
GFR, Estimated: 58 mL/min — ABNORMAL LOW (ref >60)
Glucose, Bld: 146 mg/dL — ABNORMAL HIGH (ref 70–99)
Potassium: 4.1 mmol/L (ref 3.5–5.1)
Sodium: 140 mmol/L (ref 135–145)

## 2021-08-11 NOTE — Progress Notes (Signed)
Occupational Therapy Session Note  Patient Details  Name: Alyssa Mayo MRN: [id number] Date of Birth: 11-Oct-1945  Today's Date: 08/12/2021 OT Individual Time: 0998-3382 OT Individual Time Calculation (min): 28 min   Short Term Goals: Week 1:  OT Short Term Goal 1 (Week 1): STGs=LTGs due to ELOS  Skilled Therapeutic Interventions/Progress Updates:    Pt greeted in bed, no c/o pain, Rt arm and towel soaked with leaking IV. Nursing made aware and addressed while OT retrieved therapeutic activity for the room. Supine<sit completed with supervision and increased time. Set pt up for oral care first, pt initiating placing her toothbrush into her affected Lt hand. ADL items were placed on her Lt side to promote scanning, pt did well. Transitioned to peg board task using plastic pegs for the Lt UE, pt reporting that using the wooden pegs last week was easier. Pt needed assist from the Rt hand for in-hand manipulation, able to grasp/release pegs and secure into foam board however pt with decreased attention and would continually push pegs until they were pushed out of the board. Pt unable to self organize when following a mod-complex pattern, needed step by step instruction to meet task demands. Unable to finish pattern due to time constraints. Pt returned to bed at close of session, all needs within reach and bed alarm set. Tx focus placed on increasing functional use of the Lt hand for carryover during self care tasks and transfers.   Therapy Documentation Precautions:  Precautions Precautions: Fall, Other (comment) Precaution Comments: Loop recorder; left neglect versus visual field cut Restrictions Weight Bearing Restrictions: No ADL: ADL Eating: Set up Where Assessed-Eating: Chair Upper Body Bathing: Minimal assistance Where Assessed-Upper Body Bathing: Shower Lower Body Bathing: Minimal assistance Where Assessed-Lower Body Bathing: Shower Upper Body Dressing: Minimal assistance Where  Assessed-Upper Body Dressing: Chair Lower Body Dressing: Moderate assistance Where Assessed-Lower Body Dressing: Chair Toileting: Moderate assistance Where Assessed-Toileting: Teacher, adult education: Curator Method: Ambulating, Stand pivot Film/video editor: Minimal TEFL teacher Method: Stand pivot, Designer, industrial/product: Emergency planning/management officer, Grab bars  Therapy/Group: Individual Therapy  Alyssa Mayo [name] 08/12/2021, 3:56 PM

## 2021-08-11 NOTE — Progress Notes (Signed)
RT spoke to patient about needing assistance with applying home CPAP.  Patient stated she does not need any assistance with applying her home mask and unit. Patient stated she is choosing not to wear it at this time.  RT will continue to monitor.

## 2021-08-12 LAB — GLUCOSE, CAPILLARY
Glucose-Capillary: 146 mg/dL — ABNORMAL HIGH (ref 70–99)
Glucose-Capillary: 182 mg/dL — ABNORMAL HIGH (ref 70–99)
Glucose-Capillary: 139 mg/dL — ABNORMAL HIGH (ref 70–99)
Glucose-Capillary: 163 mg/dL — ABNORMAL HIGH (ref 70–99)

## 2021-08-12 MED ORDER — MAGNESIUM GLUCONATE 500 MG PO TABS
250.0000 mg | ORAL_TABLET | Freq: Every day | ORAL | Status: DC
  Administered 2021-08-12: 250 mg via ORAL
  Filled 2021-08-12: qty 1

## 2021-08-12 MED ORDER — METFORMIN HCL 500 MG PO TABS
500.0000 mg | ORAL_TABLET | Freq: Every day | ORAL | Status: DC
  Administered 2021-08-13 – 2021-08-14 (×2): 500 mg via ORAL
  Filled 2021-08-12 (×2): qty 1

## 2021-08-12 MED ORDER — METFORMIN HCL 500 MG PO TABS
250.0000 mg | ORAL_TABLET | Freq: Every day | ORAL | Status: DC
  Administered 2021-08-12: 250 mg via ORAL
  Filled 2021-08-12: qty 1

## 2021-08-12 NOTE — Progress Notes (Signed)
Occupational Therapy Session Note  Patient Details  Name: Alyssa Mayo MRN: [id number] Date of Birth: 11-Nov-1945  Today's Date: 08/12/2021 OT Individual Time: [id number] OT Individual Time Calculation (min): 70 min    Short Term Goals: Week 1:  OT Short Term Goal 1 (Week 1): STGs=LTGs due to ELOS  Skilled Therapeutic Interventions/Progress Updates:    Pt sitting up in recliner, requesting to shower.  Nurse made aware and able to disconnect pt from IV fluids for therapy duration.  Sit to stand and ambulation of approx 10 feet to toilet using RW and close supervision (needing Vcs to attend to LUE and RW mgt).  Pt completed toilet transfer with close supervision and toileting 3/3 with min assist to assist in pulling pants and brief over hips on left side.  Pt ambulated without AD with close supervision to tub bench.  Stand to sit using grab bar and pt bathed UB and LB with close supervision.  Noted substantial increased functional use of LUE including hand during bathing (needing increased time due to slow processing and motor output LUE).  Pt ambulated with RW to recliner again with close supervision.  Donned gown with min assist to thread over left hand and mod multimodal cues to attend to left side of trunk ( she also donned inside out however when OT brought error to pts attention, pt stated she would like to keep it that way because her son would get a kick out of it).  Pt donned underwear with min assist and pad placed for mild urinary incontinence.  Pt requesting to blow dry hair.  Pt able to use LUE to dry about 25% of hair needing min hand over hand to ensure blow dryer pointing in accurate direction.  Therapist assisted in drying remainder of hair.  Call bell in reach, seat alarm on.  Therapy Documentation Precautions:  Precautions Precautions: Fall, Other (comment) Precaution Comments: Loop recorder; left neglect versus visual field cut Restrictions Weight Bearing Restrictions:  No    Therapy/Group: Individual Therapy  Ezekiel Slocumb 08/12/2021, 1:57 PM

## 2021-08-12 NOTE — Progress Notes (Signed)
PROGRESS NOTE   Subjective/Complaints: Discussed creatinine has normalized so she does not need more IVF. She would like to go home as soon as possible She asks if she will be a part of Wednesday's meeting.   ROS: +LUE pain, +left sided neglect, constipation  Objective:   No results found. No results for input(s): WBC, HGB, HCT, PLT in the last 72 hours.  Recent Labs    08/10/21 0511 08/11/21 0459  NA 140 140  K 3.9 4.1  CL 109 112*  CO2 22 20*  GLUCOSE 147* 146*  BUN 23 19  CREATININE 1.05* 1.00  CALCIUM 9.0 8.8*    Intake/Output Summary (Last 24 hours) at 08/12/2021 1540 Last data filed at 08/12/2021 0800 Gross per 24 hour  Intake 716 ml  Output --  Net 716 ml        Physical Exam: Vital Signs Blood pressure (!) 153/71, pulse 81, temperature 98.2 F (36.8 C), resp. rate 16, height 5\' 3"  (1.6 m), weight 92.4 kg, SpO2 94 %. Gen: no distress, normal appearing HEENT: oral mucosa pink and moist, NCAT Cardio: Reg rate Chest: normal effort, normal rate of breathing Abd: soft, non-distended Ext: no edema Psych: pleasant, normal affect Skin: intact Neurological/MSK     Comments: Patient is alert.  Makes eye contact with examiner.  No acute distress.  Follows commands.  Provides name age and date of birth.  Fair insight and awareness.  Strength intact except for LUE which has 3/5 hand grasp. Sensation intact. LUE tenderness. Left sided neglect     Assessment/Plan: 1. Functional deficits which require 3+ hours per day of interdisciplinary therapy in a comprehensive inpatient rehab setting. Physiatrist is providing close team supervision and 24 hour management of active medical problems listed below. Physiatrist and rehab team continue to assess barriers to discharge/monitor patient progress toward functional and medical goals  Care Tool:  Bathing              Bathing assist Assist Level: Minimal  Assistance - Patient > 75%     Upper Body Dressing/Undressing Upper body dressing        Upper body assist Assist Level: Minimal Assistance - Patient > 75%    Lower Body Dressing/Undressing Lower body dressing            Lower body assist Assist for lower body dressing: Minimal Assistance - Patient > 75%     Toileting Toileting    Toileting assist Assist for toileting: Minimal Assistance - Patient > 75%     Transfers Chair/bed transfer  Transfers assist     Chair/bed transfer assist level: Contact Guard/Touching assist Chair/bed transfer assistive device:   Ambulation assist      Assist level: Contact Guard/Touching assist Assistive device: Cane-straight Max distance: 240ft   Walk 10 feet activity   Assist     Assist level: Minimal Assistance - Patient > 75% Assistive device: Cane-straight   Walk 50 feet activity   Assist    Assist level: Minimal Assistance - Patient > 75% Assistive device: Cane-straight    Walk 150 feet activity   Assist Walk 150 feet activity did not occur: Safety/medical  concerns         Walk 10 feet on uneven surface  activity   Assist Walk 10 feet on uneven surfaces activity did not occur: Safety/medical concerns         Wheelchair     Assist Is the patient using a wheelchair?: No   Wheelchair activity did not occur: N/A         Wheelchair 50 feet with 2 turns activity    Assist    Wheelchair 50 feet with 2 turns activity did not occur: N/A       Wheelchair 150 feet activity     Assist  Wheelchair 150 feet activity did not occur: N/A       Blood pressure (!) 153/71, pulse 81, temperature 98.2 F (36.8 C), resp. rate 16, height 5\' 3"  (1.6 m), weight 92.4 kg, SpO2 94 %.    dical Problem List and Plan: 1. 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  Status post loop recorder             -may not shower  until loop recorder incision has heals             -ELOS/Goals: 5-7 days S  Continue CIR  Start B/C vitamin complex to aid stroke recovery.  2.  Antithrombotics: -DVT/anticoagulation:  Mechanical: Antiembolism stockings, thigh (TED hose) Bilateral lower extremities             -antiplatelet therapy: Aspirin 81 mg daily 3. LUE pain: Tylenol needed. Continue  kpad 4. Mood: Provide emotional support             -antipsychotic agents: N/A 5. Neuropsych: This patient is capable of making decisions on her own behalf. 6. Skin/Wound Care: Routine skin checks 7. Fluids/Electrolytes/Nutrition: Routine in and outs with follow-up chemistries 8.  Hypertension.  Hypotensive, continue Norvac 2.5mg  daily. Monitor with increased mobility. Start magnesium gluconate 250mg  HS  9.  Hyperlipidemia.  Lipitor  10.  Diabetes mellitus.  Hemoglobin A1c 7.4.  Presently on SSI.  Patient on Glucotrol XL 5 mg twice daily, Glucophage 1000 mg twice daily, Farxiga 10 mg daily prior to admission.  Increase Metformin to 500mg .  11.  Obesity.  BMI 36.49.  Dietary follow-up 12. AKI: placed nursing order to encourage 6-8 glasses of water per day. Discussed with patient. S/p fluids. Resolved. Repeat creatinine tomorrow. Monitor while starting metformin.  13. Hypoalbuminemia: discussed high blood sugars are likely binding to proteins in her blood stream, advised lo sugar/high protein diet.  14. Hypokalemia: supplement 1/21, repeat tomorrow.  15. Suboptimal Vitamin D: start ergocalciferol 50,000U once per week for 7 weeks.  16. Polypharmacy: wean Protonix to 20mg  since no apparent indication.  17. Constipation: add magnesium gluconate 250mg HS  LOS: 5 days A FACE TO FACE EVALUATION WAS PERFORMED  Ramia Sidney 08/12/2021, 3:40 PM

## 2021-08-12 NOTE — Progress Notes (Signed)
Physical Therapy Session Note  Patient Details  Name: Alyssa Mayo MRN: [id number] Date of Birth: 03-23-1946  Today's Date: 08/12/2021 PT Individual Time: 0915-1030 PT Individual Time Calculation (min): 75 min   Short Term Goals: Week 1:  PT Short Term Goal 1 (Week 1): Pt will complete supine<>sit transfer w/ S. PT Short Term Goal 2 (Week 1): Pt will transfer bed to chair w/ LRAD w/ S. PT Short Term Goal 3 (Week 1): Pt will ambulate 150 ft w/ LRAD w/ S. PT Short Term Goal 4 (Week 1): Pt will complete 4 steps w/ R hand rail w/ S  Skilled Therapeutic Interventions/Progress Updates:    Pt met sitting in recliner w/ RUE IV, agreeable to PT, and denied pain in hand. Focus of session on L visual scanning and ambulation w/ RW instead of SPC for safety. Pt impulsive, standing before AD or w/c is set and demonstrated difficulty w/ safety awareness throughout session. Pt wheeled to 23M rehab gym for time and completed ~72ft ambultion circle w/ 4 left turns w/ RW and minA for IV management and directional cues. Colored cone finding task done in hallway ~182ft w/ pt requiring VC for scanning L and safety awareness w/ RW. Pt would frequently pick up walker to turn, run into obstacles on L, and increase gait speed while walking straight. 24ft figure 8 completed in hallway x3 trials, demonstrating noted difficulties above. Visual scanning, standing and reaching forward outside BoS w/ number sequencing 1-10 completed standing at mirror w/ RW and CGA. Multimodal cues required to keep pt from stepping towards mirror or reaching to bring mirror closer. Finished session w/ pt ambulating to ortho gym ~10ft w/ RW and minA for IV management and directional cues for BITS number tracking and scanning visual field tests in standing. Frequent VC required for L visual scanning during number tracking but not during visual field tests. Pt wheeled back to room due to time and fatigue, transferred from w/c to recliner w/ CGA,  seat belt alarm on, call bell in hand, and all needs met. Pt would continue to benefit from L visual field scanning tasks to address  inattention and safety awareness w/ RW.   Therapy Documentation Precautions:  Precautions Precautions: Fall, Other (comment) Precaution Comments: Loop recorder; left neglect versus visual field cut Restrictions Weight Bearing Restrictions: No General:    Therapy/Group: Individual Therapy  Lucile Shutters, SPT  08/12/2021, 8:00 AM

## 2021-08-12 NOTE — Progress Notes (Signed)
Speech Language Pathology Daily Session Note  Patient Details  Name: Leiny Frech MRN: [id number] Date of Birth: June 03, 1946  Today's Date: 08/12/2021 SLP Individual Time: 0730-0800 SLP Individual Time Calculation (min): 30 min  Short Term Goals: Week 1: SLP Short Term Goal 1 (Week 1): STG = LTG d/t ELOS  Skilled Therapeutic Interventions: Skilled treatment session focused on cognitive goals. Upon arrival, patient was awake in the recliner with her breakfast tray in front of her. Min verbal cues were needed for initiation and problem solving with tray set-up. Min verbal cues were also needed for sustained attention to task for ~20 minutes. Patient with minimal verbal expression throughout the session but requested to use the bathroom. Patient was continent of bladder with Min verbal cues were needed for safety and attention to LUE during toileting and while washing her hands at the sink. Patient left upright in the recliner with alarm on and all needs within reach. Continue with current plan of care.      Pain No/Denies Pain   Therapy/Group: Individual Therapy  Brallan Denio 08/12/2021, 12:05 PM

## 2021-08-13 LAB — BASIC METABOLIC PANEL
Anion gap: 9 (ref 5–15)
BUN: 20 mg/dL (ref 8–23)
CO2: 23 mmol/L (ref 22–32)
Calcium: 9.1 mg/dL (ref 8.9–10.3)
Chloride: 109 mmol/L (ref 98–111)
Creatinine, Ser: 1.01 mg/dL — ABNORMAL HIGH (ref 0.44–1.00)
GFR, Estimated: 58 mL/min — ABNORMAL LOW (ref >60)
Glucose, Bld: 146 mg/dL — ABNORMAL HIGH (ref 70–99)
Potassium: 4.1 mmol/L (ref 3.5–5.1)
Sodium: 141 mmol/L (ref 135–145)

## 2021-08-13 LAB — GLUCOSE, CAPILLARY
Glucose-Capillary: 143 mg/dL — ABNORMAL HIGH (ref 70–99)
Glucose-Capillary: 167 mg/dL — ABNORMAL HIGH (ref 70–99)
Glucose-Capillary: 167 mg/dL — ABNORMAL HIGH (ref 70–99)
Glucose-Capillary: 156 mg/dL — ABNORMAL HIGH (ref 70–99)

## 2021-08-13 MED ORDER — SODIUM CHLORIDE 0.9 % IV SOLN: INTRAVENOUS | Status: DC

## 2021-08-13 MED ORDER — MAGNESIUM GLUCONATE 500 MG PO TABS
500.0000 mg | ORAL_TABLET | Freq: Every day | ORAL | Status: DC
  Administered 2021-08-13 – 2021-08-21 (×9): 500 mg via ORAL
  Filled 2021-08-13 (×9): qty 1

## 2021-08-13 NOTE — Progress Notes (Signed)
Occupational Therapy Session Note  Patient Details  Name: Alyssa Mayo MRN: [id number] Date of Birth: [birth year]  Today's Date: 08/13/2021 OT Individual Time: [id number] OT Individual Time Calculation (min): 24 min    Short Term Goals: Week 1:  OT Short Term Goal 1 (Week 1): STGs=LTGs due to ELOS  Skilled Therapeutic Interventions/Progress Updates:  Skilled OT intervention completed with focus on ADL retraining. Pt received seated in recliner, with gown noticeably saturated in food spillage, with pt agreeable to completing self-care tasks during session. Pt requesting to use SPC for sit > stand with pt able to complete with CGA, then therapist lifted gown over bottom to clear for dressing. While seated, pt able to doff gown with supervision, then completed UB bathing with supervision with pt only washing underarms and donning deodorant using BUE however with increased time on LUE. Pt donned new shirt with min A, with pt initiating hemi technique, however losing clearance of LUE through the arm hole. Pt reported that her husband has used a Secondary school teacher and sock aid at home to assist with dressing, but pt unable to teach back use of reacher for LB dressing. Pt able to thread BLEs with min A and sit > stand with CGA and SPC to donn pants over hips with pt demonstrating difficulty finding pant line on L side. While in stance with SPC, pt completed mass practice of reaching LUE in front of her to reaching her bottom, to increase attention to the L side to improve independence with donning pants. Pt set up with hair comb, and left seated in recliner, with alarm belt on and all needs in reach at end of session.   Therapy Documentation Precautions:  Precautions Precautions: Fall, Other (comment) Precaution Comments: Loop recorder; left neglect versus visual field cut Restrictions Weight Bearing Restrictions: No  Pain: No c/o pain   Therapy/Group: Individual Therapy  Robbyn Hodkinson E Slade [place]point 08/13/2021,  7:37 AM

## 2021-08-13 NOTE — Progress Notes (Signed)
Speech Language Pathology Daily Session Note  Patient Details  Name: Alyssa Mayo MRN: [id number] Date of Birth: 1945/09/10  Today's Date: 08/13/2021 SLP Individual Time: 0732-0815 SLP Individual Time Calculation (min): 43 min  Short Term Goals: Week 1: SLP Short Term Goal 1 (Week 1): STG = LTG d/t ELOS  Skilled Therapeutic Interventions:Skilled ST services focused on cognitive skills. SLP facilitated mildly complex problem solving and error awareness skills in BID medication management task. Pt made x3 errors and only noticed errors when prompted to examine pill organizer, however when instructed to check/close each window after placing pills in organizer no errors were made. Pt required physical assistance opening and closing pill organizer. Pt support her son will be able to assist in medication management. Pt demonstrated increase verbal initiation when given favorable topics such as grandchildren verse medical questions, SLP questions memory deficits verse initiation. Pt was left in room with call bell within reach and chair alarm set. SLP recommends to continue skilled services.     Pain Pain Assessment Pain Score: 0-No pain  Therapy/Group: Individual Therapy  Alyssa Mayo  [facility] 08/13/2021, 4:15 PM

## 2021-08-13 NOTE — Patient Care Conference (Addendum)
Inpatient RehabilitationTeam Conference and Plan of Care Update Date: 08/21/2021   Time: 13:00 PM    Patient Name: Alyssa Mayo      Medical Record Number: [id number]  Date of Birth: [birth year] Sex: Female         Room/Bed: 4W13C/4W13C-01 Payor Info: Payor: [employer] MEDICARE / Plan: Monia Pouch MEDICARE HMO/PPO / Product Type: *No Product type* /    Admit Date/Time:  08/07/2021  2:38 PM  Primary Diagnosis:  CVA (cerebral vascular accident) [facility])  Hospital Problems: Principal Problem:   CVA (cerebral vascular accident) [facility]) Active Problems:   Right middle cerebral artery stroke [facility])    Expected Discharge Date: Expected Discharge Date: 08/22/21  Team Members Present: Physician leading conference: Dr. Sula Soda Social Worker Present: Dossie Der, LCSW Nurse Present: Chana Bode, RN PT Present: Wynelle Link, PT OT Present: Dolphus Jenny, OT SLP Present: Eilene Ghazi, SLP PPS Coordinator present : Fae Pippin, SLP     Current Status/Progress Goal Weekly Team Focus  Bowel/Bladder   Pt continent of B/B. LBM 08/18/2021  regular BMs every 3 days or less  Assess B/B every shift and PRN   Swallow/Nutrition/ Hydration             ADL's   supervision-min assist overall; improved functional use of LUE  downgraded to min-supervision due to slow progress  adl training, functional transfer training, left inattention left neglect, neuro re-ed LUE, pt education, dc planning   Mobility   bed mobility S w/ VC, bed to chair S, ambulation w/ SPC CGA up ~178ft. stairs x8 CGA. primary deficits L inattention, mild impulsivity, poor safety awareness, mild motor planning  S overall  visual scanning, dynamic balance, endurance, bed mobility, ambulation w/ turns   Communication   supervision A, flat affect and occasiona delayed response  Supervision A  education and increasing verbalizations   Safety/Cognition/ Behavioral Observations  mod-min A  Min A - downgraded 1/30  education, error  awareness, recall and mildly complex problem solving   Pain   pt consistently denies pain  continue denial of pain  assess pain every shift and PRN   Skin   skin intact  no new breakdown  assess skin every shift and PRN     Discharge Planning:  Son coming today for education in prepartion of DC tomorrow. Aware mom will require 24/7 supervision due to left neglect and inattention alos visual defictis. Home health arranged and has DME   Team Discussion: Patient with left inattention. BP meds  and DM medications adjusted per MD. AKI resolved. Progress limited by left inattention and poor safety awareness.  Patient on target to meet rehab goals: Doing well overall, currently needs supervision - min assist for lower body care and CGA - supervision for upper body care. Continue to note sensory proprioception deficits but able to incorporate left upper extremity into care. Needs supervision assist for bed - chair transfers and CGA for ambulating up to 150' with SPC.   *See Care Plan and progress notes for long and short-term goals.   Revisions to Treatment Plan:  Downgraded goals for supervision level overall  Teaching Needs: Safety, transfers, toileting, medication management,secondary risk management, etc  Current Barriers to Discharge: Decreased caregiver support and Home enviroment access/layout  Possible Resolutions to Barriers: Family education with son Recommend 24/7 supervision OP/HH follow up SLP services     Medical Summary Current Status: flat affect, obesity elevated blood pressure, left sided neglect, tachycardia, elevated CBGs  Barriers to Discharge: Behavior;Weight  Barriers to Discharge Comments: flat affect, obesity elevated blood pressure, left sided neglect, tachycardia, elevated CBGs Possible Resolutions to [employer] Focus: provide dietary education, discontinue amlodpine given dizziness with low blood pressure, increase metformin to 1000mg  daily, continue to  monitor HR and BP   Continued Need for Acute Rehabilitation Level of Care: The patient requires daily medical management by a physician with specialized training in physical medicine and rehabilitation for the following reasons: Direction of a multidisciplinary physical rehabilitation program to maximize functional independence : Yes Medical management of patient stability for increased activity during participation in an intensive rehabilitation regime.: Yes Analysis of laboratory values and/or radiology reports with any subsequent need for medication adjustment and/or medical intervention. : Yes   I attest that I was present, lead the team conference, and concur with the assessment and plan of the team.   B 08/21/2021, 4:02 PM

## 2021-08-13 NOTE — Progress Notes (Signed)
Physical Therapy Session Note  Patient Details  Name: Alyssa Mayo MRN: [id number] Date of Birth: [birth year]  Today's Date: 08/13/2021 PT Individual Time: 1000-1055 PT Individual Time Calculation (min): 55 min   Short Term Goals: Week 1:  PT Short Term Goal 1 (Week 1): Pt will complete supine<>sit transfer w/ S. PT Short Term Goal 2 (Week 1): Pt will transfer bed to chair w/ LRAD w/ S. PT Short Term Goal 3 (Week 1): Pt will ambulate 150 ft w/ LRAD w/ S. PT Short Term Goal 4 (Week 1): Pt will complete 4 steps w/ R hand rail w/ S  Skilled Therapeutic Interventions/Progress Updates:    Pt slowly woken up from sleep in bed. Pt tired due to being up since 1am. Pt agreeable to PT despite being tired, remaining flat affect throughout session. Rolling L and R completed in bed and supine<>sit EOB completed w/ S. While sitting EOB, discussed d/c plan w/ pt stating oldest son, Elberta Fortis (81 y/o), will be coming and staying with her at her house while son who lives w/ pt is on vacation. Pt eager to walk w/ cane but discussed using RW w/ L hand splint for long distances and safety. Pt ambulated to 63M rehab gym w/ RW and CGA demonstrating difficulties w/ management of obstacles in hallway, especially on L side. Stairs x8 completed w/ CGA and both hand rails. Reciprocal pattern during ascending and step to pattern descending. Pt fatigued after stairs w/ HR 99 and SPO2 97. Pt completed standing w/o AD minA for balance to complete card matching from right to L on mirror. Pt demonstrated ability to weight shift and turn to towards mirror to find matching pair. Pt encouraged to use L hand and completed w/ pincer grip. Pt continues to demonstrate difficulty w/ scanning to find cards on top, far L side when facing mirror, requiring minA for scanning all cards. Pt requested seated rest break after 5 min due to back pain 1/10. Pt stated she frequently has back pain at worst 6/10 during ambulation but has never stated  pain during ambulation trials. Pt completed clock drawing test during seated rest break and able to draw correct clock and time except for 7 and 8. Pt identified mistake after comparing her drawing to another clock drawing. Unsure if L visual field cut is hemianopia or quadranopia and will continue to assess. Pt ambulated back to room ~164ft w/ RW and CGA, transferred to recliner, seat belt on, call bell next to pt, and all needs met. Plan to discuss d/c date w/ team prior to conference.  Therapy Documentation Precautions:  Precautions Precautions: Fall, Other (comment) Precaution Comments: Loop recorder; left neglect versus visual field cut Restrictions Weight Bearing Restrictions: No General:   Therapy/Group: Individual Therapy  Lucile Shutters, SPT  08/13/2021, 12:26 PM

## 2021-08-13 NOTE — Progress Notes (Signed)
PROGRESS NOTE   Subjective/Complaints: No new complaints this morning Cr slightly up- discussed IV fluids again tonight Did not wear CPAP last night Discussed she will not need insulin at home.   ROS: +LUE pain, +left sided neglect, constipation, denies chest pain  Objective:   No results found. No results for input(s): WBC, HGB, HCT, PLT in the last 72 hours.  Recent Labs    08/11/21 0459 08/13/21 0557  NA 140 141  K 4.1 4.1  CL 112* 109  CO2 20* 23  GLUCOSE 146* 146*  BUN 19 20  CREATININE 1.00 1.01*  CALCIUM 8.8* 9.1    Intake/Output Summary (Last 24 hours) at 08/13/2021 1146 Last data filed at 08/13/2021 0240 Gross per 24 hour  Intake 1072 ml  Output --  Net 1072 ml        Physical Exam: Vital Signs Blood pressure (!) 135/45, pulse 77, temperature 98 F (36.7 C), resp. rate 17, height 5\' 3"  (1.6 m), weight 92.4 kg, SpO2 93 %. Gen: no distress, normal appearing HEENT: oral mucosa pink and moist, NCAT Cardio: Reg rate Chest: normal effort, normal rate of breathing Abd: soft, non-distended Ext: no edema Psych: pleasant, normal affect Skin: intact  Neurological/MSK     Comments: Patient is alert.  Makes eye contact with examiner.  No acute distress.  Follows commands.  Provides name age and date of birth.  Fair insight and awareness.  Strength intact except for LUE which has 3/5 hand grasp. Sensation intact. LUE tenderness. Left sided neglect     Assessment/Plan: 1. Functional deficits which require 3+ hours per day of interdisciplinary therapy in a comprehensive inpatient rehab setting. Physiatrist is providing close team supervision and 24 hour management of active medical problems listed below. Physiatrist and rehab team continue to assess barriers to discharge/monitor patient progress toward functional and medical goals  Care Tool:  Bathing              Bathing assist Assist Level:  Minimal Assistance - Patient > 75%     Upper Body Dressing/Undressing Upper body dressing   What is the patient wearing?: Pull over shirt    Upper body assist Assist Level: Minimal Assistance - Patient > 75%    Lower Body Dressing/Undressing Lower body dressing      What is the patient wearing?: Pants     Lower body assist Assist for lower body dressing: Minimal Assistance - Patient > 75%     Toileting Toileting    Toileting assist Assist for toileting: Minimal Assistance - Patient > 75%     Transfers Chair/bed transfer  Transfers assist     Chair/bed transfer assist level: Contact Guard/Touching assist Chair/bed transfer assistive device:   Ambulation assist      Assist level: Contact Guard/Touching assist Assistive device: Cane-straight Max distance: 245ft   Walk 10 feet activity   Assist     Assist level: Minimal Assistance - Patient > 75% Assistive device: Cane-straight   Walk 50 feet activity   Assist    Assist level: Minimal Assistance - Patient > 75% Assistive device: Cane-straight    Walk 150 feet activity  Assist Walk 150 feet activity did not occur: Safety/medical concerns         Walk 10 feet on uneven surface  activity   Assist Walk 10 feet on uneven surfaces activity did not occur: Safety/medical concerns         Wheelchair     Assist Is the patient using a wheelchair?: No   Wheelchair activity did not occur: N/A         Wheelchair 50 feet with 2 turns activity    Assist    Wheelchair 50 feet with 2 turns activity did not occur: N/A       Wheelchair 150 feet activity     Assist  Wheelchair 150 feet activity did not occur: N/A       Blood pressure (!) 135/45, pulse 77, temperature 98 F (36.7 C), resp. rate 17, height 5\' 3"  (1.6 m), weight 92.4 kg, SpO2 93 %.    dical Problem List and Plan: 1. Functional deficits secondary to right MCA infarct with  hemorrhagic transformation.  Status post loop recorder             -may not shower until loop recorder incision has heals             -ELOS/Goals: 5-7 days S  Continue CIR  Start B/C vitamin complex to aid stroke recovery.   Messaged team for discharge planning.  2.  Antithrombotics: -DVT/anticoagulation:  Mechanical: Antiembolism stockings, thigh (TED hose) Bilateral lower extremities             -antiplatelet therapy: Aspirin 81 mg daily 3. LUE pain: Tylenol needed. Continue  kpad 4. Mood: Provide emotional support             -antipsychotic agents: N/A 5. Neuropsych: This patient is capable of making decisions on her own behalf. 6. Skin/Wound Care: Routine skin checks 7. Fluids/Electrolytes/Nutrition: Routine in and outs with follow-up chemistries 8.  Hypertension.  Hypotensive, continue Norvac 2.5mg  daily. Monitor with increased mobility. Increase magnesium gluconate to 500mg  HS  9.  Hyperlipidemia.  Lipitor  10.  Diabetes mellitus.  Hemoglobin A1c 7.4.  Presently on SSI.  Patient on Glucotrol XL 5 mg twice daily, Glucophage 1000 mg twice daily, Farxiga 10 mg daily prior to admission.  Increase Metformin to 500mg . Discussed that she will not need insulin at home.  11.  Obesity.  BMI 36.49.  Dietary follow-up 12. AKI: placed nursing order to encourage 6-8 glasses of water per day. Discussed with patient. S/p fluids. Uptrending slightly, more IVF tonight  13. Hypoalbuminemia: discussed high blood sugars are likely binding to proteins in her blood stream, advised lo sugar/high protein diet.  14. Hypokalemia: supplement 1/21, repeat tomorrow.  15. Suboptimal Vitamin D: start ergocalciferol 50,000U once per week for 7 weeks.  16. Polypharmacy: wean Protonix to 20mg  since no apparent indication.  17. Constipation: add magnesium gluconate 250mg HS  LOS: 6 days A FACE TO FACE EVALUATION WAS PERFORMED  Laurelle Skiver 08/13/2021, 11:46 AM

## 2021-08-13 NOTE — Progress Notes (Signed)
Occupational Therapy Session Note  Patient Details  Name: Alyssa Mayo MRN: [id number] Date of Birth: 18-Nov-1945  Today's Date: 08/13/2021 OT Missed Time: 60 Minutes Missed Time Reason: Patient fatigue   Short Term Goals: Week 1:  OT Short Term Goal 1 (Week 1): STGs=LTGs due to ELOS  Skilled Therapeutic Interventions/Progress Updates:    Pt in bed asleep, needing max cues to awaken.  Pt shaking her head "no" when prompted to participate with OT.  Pt also politely shaking her head no to bed level therex.  Pt asking to sleep due to not sleeping well previous night and fatigue from recent PT session.  Missed 60 minutes of treatment.  Therapy Documentation Precautions:  Precautions Precautions: Fall, Other (comment) Precaution Comments: Loop recorder; left neglect versus visual field cut Restrictions Weight Bearing Restrictions: No    Therapy/Group: Individual Therapy  Amie Critchley 08/13/2021, 2:58 PM

## 2021-08-13 NOTE — Progress Notes (Signed)
Didn't want to wear CPAP machine. Restless night. OOB to recliner X 2. Denies pain. HS IVF's. Slow to process and respond. Flat affect. Alyssa Mayo A

## 2021-08-14 LAB — BASIC METABOLIC PANEL
Anion gap: 9 (ref 5–15)
BUN: 20 mg/dL (ref 8–23)
CO2: 23 mmol/L (ref 22–32)
Calcium: 9.3 mg/dL (ref 8.9–10.3)
Chloride: 107 mmol/L (ref 98–111)
Creatinine, Ser: 0.92 mg/dL (ref 0.44–1.00)
GFR, Estimated: >60 mL/min (ref >60)
Glucose, Bld: 149 mg/dL — ABNORMAL HIGH (ref 70–99)
Potassium: 3.7 mmol/L (ref 3.5–5.1)
Sodium: 139 mmol/L (ref 135–145)

## 2021-08-14 LAB — GLUCOSE, CAPILLARY
Glucose-Capillary: 156 mg/dL — ABNORMAL HIGH (ref 70–99)
Glucose-Capillary: 156 mg/dL — ABNORMAL HIGH (ref 70–99)
Glucose-Capillary: 177 mg/dL — ABNORMAL HIGH (ref 70–99)

## 2021-08-14 MED ORDER — METFORMIN HCL 850 MG PO TABS
850.0000 mg | ORAL_TABLET | Freq: Every day | ORAL | Status: DC
  Administered 2021-08-15 – 2021-08-21 (×7): 850 mg via ORAL
  Filled 2021-08-14 (×7): qty 1

## 2021-08-14 MED ORDER — POTASSIUM CHLORIDE 20 MEQ PO PACK
20.0000 meq | PACK | Freq: Once | ORAL | Status: AC
  Administered 2021-08-14: 09:00:00 20 meq via ORAL
  Filled 2021-08-14: qty 1

## 2021-08-14 NOTE — Progress Notes (Signed)
PROGRESS NOTE   Subjective/Complaints: AKI resolved- discussed stopping fluids and she is happy about this given her urinary frequency with fluids. Nursing notes that she does not drink enough fluids at rest so they are trying to encourage this  ROS: +LUE pain, +left sided neglect, constipation, denies chest pain  Objective:   No results found. No results for input(s): WBC, HGB, HCT, PLT in the last 72 hours.  Recent Labs    08/13/21 0557 08/14/21 0520  NA 141 139  K 4.1 3.7  CL 109 107  CO2 23 23  GLUCOSE 146* 149*  BUN 20 20  CREATININE 1.01* 0.92  CALCIUM 9.1 9.3    Intake/Output Summary (Last 24 hours) at 08/14/2021 1113 Last data filed at 08/13/2021 1804 Gross per 24 hour  Intake 340 ml  Output --  Net 340 ml        Physical Exam: Vital Signs Blood pressure (!) 122/46, pulse 79, temperature 98 F (36.7 C), resp. rate 15, height 5\' 3"  (1.6 m), weight 92.4 kg, SpO2 95 %. Gen: no distress, normal appearing HEENT: oral mucosa pink and moist, NCAT Cardio: Reg rate Chest: normal effort, normal rate of breathing Abd: soft, non-distended Ext: no edema Psych: pleasant, flat affect Skin: intact  Neurological/MSK     Comments: Patient is alert.  Makes eye contact with examiner.  No acute distress.  Follows commands.  Provides name age and date of birth.  Fair insight and awareness.  Strength intact except for LUE which has 3/5 hand grasp. Sensation intact. LUE tenderness. Left sided neglect     Assessment/Plan: 1. Functional deficits which require 3+ hours per day of interdisciplinary therapy in a comprehensive inpatient rehab setting. Physiatrist is providing close team supervision and 24 hour management of active medical problems listed below. Physiatrist and rehab team continue to assess barriers to discharge/monitor patient progress toward functional and medical goals  Care Tool:  Bathing               Bathing assist Assist Level: Minimal Assistance - Patient > 75%     Upper Body Dressing/Undressing Upper body dressing   What is the patient wearing?: Pull over shirt    Upper body assist Assist Level: Minimal Assistance - Patient > 75%    Lower Body Dressing/Undressing Lower body dressing      What is the patient wearing?: Pants     Lower body assist Assist for lower body dressing: Minimal Assistance - Patient > 75%     Toileting Toileting    Toileting assist Assist for toileting: Minimal Assistance - Patient > 75%     Transfers Chair/bed transfer  Transfers assist     Chair/bed transfer assist level: Contact Guard/Touching assist Chair/bed transfer assistive device: Research officer, political party   Ambulation assist      Assist level: Contact Guard/Touching assist Assistive device: Cane-straight Max distance: 222ft   Walk 10 feet activity   Assist     Assist level: Minimal Assistance - Patient > 75% Assistive device: Cane-straight   Walk 50 feet activity   Assist    Assist level: Minimal Assistance - Patient > 75% Assistive device: Cane-straight  Walk 150 feet activity   Assist Walk 150 feet activity did not occur: Safety/medical concerns         Walk 10 feet on uneven surface  activity   Assist Walk 10 feet on uneven surfaces activity did not occur: Safety/medical concerns         Wheelchair     Assist Is the patient using a wheelchair?: No   Wheelchair activity did not occur: N/A         Wheelchair 50 feet with 2 turns activity    Assist    Wheelchair 50 feet with 2 turns activity did not occur: N/A       Wheelchair 150 feet activity     Assist  Wheelchair 150 feet activity did not occur: N/A       Blood pressure (!) 122/46, pulse 79, temperature 98 F (36.7 C), resp. rate 15, height 5\' 3"  (1.6 m), weight 92.4 kg, SpO2 95 %.    dical Problem List and Plan: 1. Functional deficits  secondary to right MCA infarct with hemorrhagic transformation.  Status post loop recorder             -may not shower until loop recorder incision has heals             -ELOS/Goals: 5-7 days S  Continue CIR  Start B/C vitamin complex to aid stroke recovery.   Messaged team for discharge planning.  2.  Antithrombotics: -DVT/anticoagulation:  Mechanical: Antiembolism stockings, thigh (TED hose) Bilateral lower extremities             -antiplatelet therapy: Aspirin 81 mg daily 3. LUE pain: Tylenol needed. Continue  kpad 4. Mood: Provide emotional support             -antipsychotic agents: N/A 5. Neuropsych: This patient is capable of making decisions on her own behalf. 6. Skin/Wound Care: Routine skin checks 7. Fluids/Electrolytes/Nutrition: Routine in and outs with follow-up chemistries 8.  Hypertension.  Hypotensive, continue Norvac 2.5mg  daily. Monitor with increased mobility. Increase magnesium gluconate to 500mg  HS  9.  Hyperlipidemia.  Lipitor  10.  Diabetes mellitus.  Hemoglobin A1c 7.4.  Presently on SSI.  Patient on Glucotrol XL 5 mg twice daily, Glucophage 1000 mg twice daily, Farxiga 10 mg daily prior to admission.  Increase Metformin to 500mg . Discussed that she will not need insulin at home.  11.  Obesity.  BMI 36.49.  Dietary follow-up 12. AKI: Resolved. Placed nursing order to encourage 6-8 glasses of water per day. Discussed with patient. d/c fluids. Continue to monitor creatinine tomorrow.  13. Hypoalbuminemia: discussed high blood sugars are likely binding to proteins in her blood stream, advised lo sugar/high protein diet.  14. Hypokalemia: supplement 1/21 and 1/25, repeat tomorrow.  15. Suboptimal Vitamin D: start ergocalciferol 50,000U once per week for 7 weeks.  16. GERD: wean Protonix to 20mg  since no apparent indication. Will maintain this dose since it does help with her GERD. Discussed with her lowering the dose.   17. Constipation: continue magnesium gluconate  250mg HS 18. Slow processing: continue SLP 19. Cognitive deficits: continue SLP  LOS: 7 days A FACE TO FACE EVALUATION WAS PERFORMED  Anberlyn Feimster P Davis Ambrosini 08/14/2021, 11:13 AM

## 2021-08-14 NOTE — Progress Notes (Signed)
Occupational Therapy Session Note  Patient Details  Name: Alyssa Mayo MRN: [id number] Date of Birth: July 28, 1945  Today's Date: 08/14/2021 OT Individual Time: 1130-1230 OT Individual Time Calculation (min): 60 min    Short Term Goals: Week 1:  OT Short Term Goal 1 (Week 1): STGs=LTGs due to ELOS   Skilled Therapeutic Interventions/Progress Updates:    Pt asleep in bed, reporting she just wants the covers pulled over her.  After some encouragement and blanket offered around shoulders, pt agreeable to participate with OT. No c/o pain throughout session.  Supine to sit without bed features with CGA.  Pt requesting to use bathroom.  Sit to stand with close supervision and ambulated to bathroom using SPC with CGA.  Toilet transfer completed with CGA and 3/3 toileting with supervision.  Pt ambulated to sink with CGA and brushed teeth with supervision.    Pt transported to gym and completed box and blocks assessment: Right hand 37 blocks; left hand 12 blocks.  Prior to assessment, pt exhibiting impulsivity and randomly picking up blocks and tossing in box despite therapist providing cues to first attend to directions.  Pt ultimately needed tactile and visual cues to redirect and attend.  Pt then instructed on fine motor coordination task finger<>palm translation pick up and place small sponge cubes into cup.  Pt able to express awareness of limited fine motor control to therapist.  Pt often picking up cubes with gross grasp instead of 3 jaw chuck pinch.  Pt then instructed in gross grasp using medium resistive putty.  Also completed 3 jaw chuck pinch and retrieve beads from putty.  Pt requiring increased time to retrieve and needing cues to achieve correct pinching position.    Returned to room, step pivot to recliner with CGA.  Call bell in reach, lunch tray setup, seat alarm on.  Therapy Documentation Precautions:  Precautions Precautions: Fall, Other (comment) Precaution Comments: Loop  recorder; left neglect versus visual field cut Restrictions Weight Bearing Restrictions: No    Therapy/Group: Individual Therapy  Ezekiel Slocumb 08/14/2021, 1:33 PM

## 2021-08-14 NOTE — Progress Notes (Signed)
Pt has home CPAP at bedside and dose not need assistance placing CPAP at this time.

## 2021-08-14 NOTE — Progress Notes (Signed)
Physical Therapy Session Note  Patient Details  Name: Alyssa Mayo MRN: [id number] Date of Birth: [birth year]  Today's Date: 08/14/2021 PT Individual Time: 0800-0855 PT Individual Time Calculation (min): 55 min   Short Term Goals: Week 1:  PT Short Term Goal 1 (Week 1): Pt will complete supine<>sit transfer w/ S. PT Short Term Goal 2 (Week 1): Pt will transfer bed to chair w/ LRAD w/ S. PT Short Term Goal 3 (Week 1): Pt will ambulate 150 ft w/ LRAD w/ S. PT Short Term Goal 4 (Week 1): Pt will complete 4 steps w/ R hand rail w/ S  Skilled Therapeutic Interventions/Progress Updates:    Pt met lying in bed and agreeable to therapy, reports no pain. Pt requested to use bathroom. Bed mobility minA w/ use of hand rails for trunk elevation from R side lying. Sit<>stand and ambulation w/ SPCt o bathroom CGA. Pt completed peri-care w/ S and needed modA to pull pants over hips in standing. Pt ambulated to recliner for rest and maxA w/ donning shoes for time. Pt ambulated w/ RW CGA and increased trunk kyphosis to 16M rehab gym ~169ft, requiring seated rest break once in gym. HR 115 after ambulation and decreased to 95 w/ rest. HR continuously increased to 115 after short bouts of ambulation and stayed ~95-100 during rest. No shortness of breath noted and SPO2 maintained >94% throughout session. Frequent rest breaks given after each bout of exercise due to tachycardia. MD morning rounds during rest break and notified MD about tachycardia episodes. Colored cone finding completed in gym w/ RW and CGA ~10ft. Pt demonstrated improved ability to scan environment, especially on the L, but still requires directional cues. Gait trials w/ RW and SPC CGA completed ~64ft w/ 4 L turns to help determine LRAD. Pt demonstrated decreased gait speed w/ SPC vs RW and will continue to assess w/ outcome measures next session. RW height increased to promote upright posture alongside VC during ambulation. Pt still demonstrates L  inattention and decreased safety awareness during turns and transfers from stand<>sit. Concluded session w/ ambulation ~168ft w/ RW and CGA back to room, transferred to recliner for breakfast, seat belt alarm on, call bell in lap and all needs met.  Therapy Documentation Precautions:  Precautions Precautions: Fall, Other (comment) Precaution Comments: Loop recorder; left neglect versus visual field cut Restrictions Weight Bearing Restrictions: No General:     Therapy/Group: Individual Therapy  Lucile Shutters, SPT  08/14/2021, 11:03 AM

## 2021-08-14 NOTE — Patient Care Conference (Cosign Needed Addendum)
Inpatient RehabilitationTeam Conference and Plan of Care Update Date: 08/14/2021   Time: 12:59 PM    Patient Name: Alyssa Mayo      Medical Record Number: [id number]  Date of Birth: 10-24-1945 Sex: Female         Room/Bed: 4W13C/4W13C-01 Payor Info: Payor: [employer] MEDICARE / Plan: Monia Pouch MEDICARE HMO/PPO / Product Type: *No Product type* /    Admit Date/Time:  08/07/2021  2:38 PM  Primary Diagnosis:  CVA (cerebral vascular accident) [facility])  Hospital Problems: Principal Problem:   CVA (cerebral vascular accident) [facility]) Active Problems:   Right middle cerebral artery stroke [facility])    Expected Discharge Date: Expected Discharge Date: 08/22/21  Team Members Present: Physician leading conference: Dr. Sula Soda Social Worker Present: Dossie Der, LCSW Nurse Present: Other (comment) Regino Schultze) PT Present: Wynelle Link, PT OT Present: Dolphus Jenny, OT SLP Present: Colin Benton, SLP PPS Coordinator present : Fae Pippin, SLP     Current Status/Progress Goal Weekly Team Focus  Bowel/Bladder   Continent of B/B. LBM 08/13/21  Maintain Function.  Monitor B/B function   Swallow/Nutrition/ Hydration             ADL's   min assist overall for ADLs and CGA to sup for functional mobility; barriers include impaired endurance and safety awareness, and left inattention; also slow processing and impaired higher level cognition  mod I goals; most likely will need to downgrade to sup  adl training, functional transfer training, left attention, neuro re-ed LUE, pt education, dc planning   Mobility   bed mobility S, bed<>chair CGA, ambulation 171ft w/ RW minA for directional and safety awareness due to L inattention, CGA stairsx8. primary deficits L inattention, mild impulsivity, poor safety awareness, mild motor planning  has modI goals, anticipate need to downgrade to S  attending to L, safety awareness w/ RW, determining AD (RW vs SPC), D/C planning, initiating family ed    Communication   mod-min A initation and limited verbalizations at sentence level  Supervision A  increase in verbalizations and initations   Safety/Cognition/ Behavioral Observations  Mod A  supervision A  mildly complex, error awareness and recall   Pain   Denies Pain  Remain pain free.  Assess Pain status Q shift and prn   Skin   Surgical Incision to Lt chest wall (loop implant)., otherwise grossly intact.  Maintain skin integrity.  Assess incision site Q shift, and prn.     Discharge Planning:  HOme with son and his family who is going on a trip 1/26-1/31 her other son to come and be with her while he is gone. Will have 24/7 supervision at DC   Team Discussion: Patient's caregiver not available for education until end of her stay. IV fluids discontinued, nursing to encourage PO intake.  L inattention still limiting progress for mobility. Patient on target to meet rehab goals: yes, downgrade PT goals  *See Care Plan and progress notes for long and short-term goals.   Revisions to Treatment Plan:  PT goal downgraded from Mod I to supervision  Teaching Needs: Sleep apnea management, diabetes, weight management  Current Barriers to Discharge: Decreased caregiver support  Possible Resolutions to Barriers: Plan ahead to educate caregiver when he returns  prior to discharge     Medical Summary Current Status: AKI, morbid obesity, flat affect, tachycardia, suboptimal potassium, uncontrolled type 2 DM, left sided weakness and neglect, labile blood pressure  Barriers to Discharge: Behavior;Medical stability;Weight  Barriers to Discharge Comments: AKI,  morbid obesity, flat affect, tachycardia, suboptimal potassium, uncontrolled type 2 DM, left sided weakness and neglect, labile blood pressure Possible Resolutions to [employer]: continue to encourae hydration, monitor labs, may d/c fluids today, EKG, supplement potassium, uptitrate metformin, continue to monitor BP  TID   Continued Need for Acute Rehabilitation Level of Care: The patient requires daily medical management by a physician with specialized training in physical medicine and rehabilitation for the following reasons: Direction of a multidisciplinary physical rehabilitation program to maximize functional independence : Yes   I attest that I was present, lead the team conference, and concur with the assessment and plan of the team.   Bluford Kaufmann 08/14/2021, 12:59 PM

## 2021-08-14 NOTE — Progress Notes (Signed)
Patient ID: Alyssa Mayo, female   DOB: 09/21/1945, 76 y.o.   MRN: [id number]  Met with pt and spoke with son via telephone-greg to discuss team conference goals of supervision level due to inattention and neglect and target discharge date of 2/2. Have scheduled Alyssa Mayo to come in 2/1 form 1:00-3;30 for family training and made aware team recommends 24/7 supervision. He is aware and will look to hire someone to be with her during the day while he is upstairs working. Discussed home health therapies and he is in agreement with this. His brother-Alyssa Mayo will be coming to see pt over the weekend so while gregg is on his trip she will not be alone. Continue to work on discharge needs.

## 2021-08-14 NOTE — Progress Notes (Signed)
Speech Language Pathology Weekly Progress and Session Note  Patient Details  Name: Alyssa Mayo MRN: [id number] Date of Birth: [birth year]  Beginning of progress report period: August 08, 2021 End of progress report period: August 14, 2021  Today's Date: 08/14/2021 SLP Individual Time: 1400-1445 SLP Individual Time Calculation (min): 45 min  Short Term Goals: Week 1: SLP Short Term Goal 1 (Week 1): STG = LTG d/t ELOS SLP Short Term Goal 1 - Progress (Week 1): Progressing toward goal    New Short Term Goals: Week 2: SLP Short Term Goal 1 (Week 2): continue STG=LTG due to longer than expected length of stay  Weekly Progress Updates: Pt is making progress towards cognitive and linguistic goals, currently at min A. Pt has participated in medication management and functional task to target problem solving and error awareness. Pt continued to demonstrate a flat affect with limited verbalizations at the sentence/conversation level, possibly impacted by memory and initiation/processing. Pt's barrier to d/c are abstract expression, complex problem solving, error/anticipatory awareness and short term recall. Education will be completed day of d/c due to family schedule. Pt would continue to benefit from skilled ST services in order to maximize functional independence and reduce burden of care requiring 24 hour supervision and continued ST services.     Intensity: Minumum of 1-2 x/day, 30 to 90 minutes Frequency: 3 to 5 out of 7 days Duration/Length of Stay: 2/2 Treatment/Interventions: Cognitive remediation/compensation;Therapeutic Activities;Therapeutic Exercise;Patient/family education;Functional tasks;Cueing hierarchy   Daily Session  Skilled Therapeutic Interventions: Skilled ST services focused on cognitive skills. SLP facilitated problem solving and error awareness in PEG design task. Pt required supervision A verbal cues for basic problem solving, but required mod A verbal cues for  mildly complex problem solving and error awareness. Pt participated in letter cancellation task, pt was able to cancel 1 letter in a field amongst many letters at a time progressing from 3 to no errors following instruction to check for errors. However once SLP increased to two different letters amongst many letters, pt's reduced attention, reduced short term recall and fatigue began to impact performance increasing the amount of errors. Pt was left with call bell within reach and recommend to continue ST services.     General    Pain Pain Assessment Pain Score: 0-No pain  Therapy/Group: Individual Therapy  Sendy Pluta  [facility] 08/14/2021, 3:40 PM

## 2021-08-14 NOTE — Progress Notes (Signed)
Physical Therapy Session Note  Patient Details  Name: Alyssa Mayo MRN: [id number] Date of Birth: [birth year]  Today's Date: 08/14/2021 PT Individual Time: 1330-1355 PT Individual Time Calculation (min): 25 min   Short Term Goals: Week 1:  PT Short Term Goal 1 (Week 1): Pt will complete supine<>sit transfer w/ S. PT Short Term Goal 2 (Week 1): Pt will transfer bed to chair w/ LRAD w/ S. PT Short Term Goal 3 (Week 1): Pt will ambulate 150 ft w/ LRAD w/ S. PT Short Term Goal 4 (Week 1): Pt will complete 4 steps w/ R hand rail w/ S  Skilled Therapeutic Interventions/Progress Updates:    Patient received up in recliner, agreeable to PT. She did not report pain. Patient grossly nonverbal throughout session and with very delayed processing/reactions to PT cuing. She ambulated to therapy gym with SPC and CGA. Very slow gait speed noted. Berg balance scale completed where she scored a 33/56 indicating increased risk for falling and benefit from use of AD. Patient requiring multiple cues to complete various items as they were intended to be completed (keeping her feet still and looking over her shoulder vs. Moving feet and then looking over her shoulder). Discussed findings of assessment with patient and did not accept nor reject the education. Patient ambulating back to her room with SPC and CGA. Remaining up in recliner, seatbelt alarm on, call light within reach.   Therapy Documentation Precautions:  Precautions Precautions: Fall, Other (comment) Precaution Comments: Loop recorder; left neglect versus visual field cut Restrictions Weight Bearing Restrictions: No     Therapy/Group: Individual Therapy  Elizebeth Koller, PT, DPT, CBIS  08/14/2021, 7:53 AM

## 2021-08-15 ENCOUNTER — Ambulatory Visit: Payer: Medicare HMO

## 2021-08-15 ENCOUNTER — Inpatient Hospital Stay (HOSPITAL_COMMUNITY): Payer: Medicare HMO

## 2021-08-15 LAB — BASIC METABOLIC PANEL
Anion gap: 9 (ref 5–15)
BUN: 20 mg/dL (ref 8–23)
CO2: 23 mmol/L (ref 22–32)
Calcium: 9.2 mg/dL (ref 8.9–10.3)
Chloride: 109 mmol/L (ref 98–111)
Creatinine, Ser: 0.94 mg/dL (ref 0.44–1.00)
GFR, Estimated: >60 mL/min (ref >60)
Glucose, Bld: 157 mg/dL — ABNORMAL HIGH (ref 70–99)
Potassium: 4 mmol/L (ref 3.5–5.1)
Sodium: 141 mmol/L (ref 135–145)

## 2021-08-15 LAB — GLUCOSE, CAPILLARY
Glucose-Capillary: 144 mg/dL — ABNORMAL HIGH (ref 70–99)
Glucose-Capillary: 86 mg/dL (ref 70–99)
Glucose-Capillary: 123 mg/dL — ABNORMAL HIGH (ref 70–99)
Glucose-Capillary: 147 mg/dL — ABNORMAL HIGH (ref 70–99)
Glucose-Capillary: 159 mg/dL — ABNORMAL HIGH (ref 70–99)

## 2021-08-15 NOTE — Progress Notes (Signed)
Physical Therapy Weekly Progress Note  Patient Details  Name: Alyssa Mayo MRN: [id number] Date of Birth: 06-27-1946  Beginning of progress report period: August 08, 2021 End of progress report period: August 15, 2021  Today's Date: 08/15/2021 PT Individual Time: 900-943 + 1400-1445 PT Individual Time Calculation (min): 43 min + 45 min   Patient has met 0 of 4 short term goals.   Patient continues to demonstrate the following deficits decreased cardiorespiratoy endurance, decreased motor planning, and decreased attention, decreased awareness, decreased problem solving, and decreased safety awarenessand therefore will continue to benefit from skilled PT intervention to increase functional independence with mobility.  Patient not progressing toward long term goals.  See goal revision..  Plan of care revisions: Downgraded to S.  PT Short Term Goals Week 1:  PT Short Term Goal 1 (Week 1): Pt will complete supine<>sit transfer w/ S. PT Short Term Goal 2 (Week 1): Pt will transfer bed to chair w/ LRAD w/ S. PT Short Term Goal 3 (Week 1): Pt will ambulate 150 ft w/ LRAD w/ S. PT Short Term Goal 4 (Week 1): Pt will complete 4 steps w/ R hand rail w/ S  Skilled Therapeutic Interventions/Progress Updates:    Session 1 - Pt met sitting EOB w/ NT present for bathroom transfer and NT handed pt off. Pt excited to discuss family visit during session. Donned shoes maxA for time and ambulated from bed to bathroom w/ SPC to complete peri-care w/ S. After seated rest break in recliner, pt ambulated to 61M rehab gym w/ RW CGA ~172f. HR monitored throughout session w/ consistent HR 115 after short bouts of exercise and decreased to 95-100 w/ rest. 5xSTS x3 and TUG completed w/ SPC and RW w/ times below. Pt demonstrated decreased gait speed w/ SPC but no LOB. Pt ambulated ~546fw/ SPC CGA to complete 4 stairs and ~5073fack to mat. HR 122 after stairs. Overall, pt is not progressing towards modI goals and  plan to downgrade goals from modI to S due to motor planning deficits, L inattention, and endurance deficits. Pt ambulated back to room w/ RW CGA, transferred to recliner, call bell in hands, and all needs met. 5xSTS - avg 21 TUG - w/ SPC 36s, w/ RW 25s  Session 2 - Pt met sitting in recliner and agreeable to PT and happy to recently have had shower. HR was continually monitored throughout the session and frequent rest breaks given after each activity. Pt ambulated w/ RW CGA to ADL apartment for bed mobility and furniture transfer, completed 10MWT for gait speed in hallway during ambulation. HR 120 after ambulation and decreased to 105 after rest break. Pt frequently ran walker into furniture and objects on L side w/ no attempt to avoid. Sit<>supine w/ S but required heavy multimodal cues to reposition to center of bed as pt initially laid diagonally in bed. Supine to sit w/ minA but able to complete w/ S after VC for technique. Pt ambulated to recliner from R side of bed w/ SPC CGA and completed stand<>sit and sit<>stand from recliner CGA w/ SPC. Pt ambulated w/ RW CGA to ortho gym for car transfer and completed w/ S. Pt ambulated to 61M rehab gym [facility] completing 10MWT for gait speed in hallway on way to gym (results below). In rehab gym, side stepping L<>R 45f43fth CGA and no AD. backwards walking 45ft43fA no AD. tandem forward walking 45ft 13f no AD. Difficulty with maintaining straight path despite  mod cues. Delayed stepping strategies with LOB. Ambulated back to room ~46f with CGA and SPC. Transport in room to take patient off the unit for chest XR and requesting pt be in bed. Bed mobility completed with supervision with VC for symmetrical lying in the bed. Direct hand off of care to transport team. Pt continues to demonstrate deficits in safety awareness w/ RW due to L inattention and motor planning deficits. RW gait speed - 0.55 m/s - indicative of limited community ambulator SPC gait speed -  0.40 m/s - cutoff for household ambulator >0.477m Therapy Documentation Precautions:  Precautions Precautions: Fall, Other (comment) Precaution Comments: Loop recorder; left neglect versus visual field cut Restrictions Weight Bearing Restrictions: No General:    Therapy/Group: Individual Therapy  CoLucile ShuttersSPT  08/15/2021, 7:51 AM

## 2021-08-15 NOTE — Progress Notes (Signed)
SLP Cancellation Note  Patient Details Name: Alyssa Mayo MRN: [id number] DOB: Jun 13, 1946  Cancelled treatment: Pt off unit for X-ray at this time, missed 45 minutes of skilled ST. Will follow per ST POC.   Tacey Ruiz 08/15/2021, 3:43 PM

## 2021-08-15 NOTE — Progress Notes (Incomplete)
Occupational Therapy Session Note  Patient Details  Name: Alyssa Mayo MRN: [id number] Date of Birth: [birth year]  Today's Date: 08/15/2021 OT Individual Time: 0076-2263 OT Individual Time Calculation (min): 70 min    Short Term Goals: Week 1:  OT Short Term Goal 1 (Week 1): STGs=LTGs due to ELOS  Skilled Therapeutic Interventions/Progress Updates:      Therapy Documentation Precautions:  Precautions Precautions: Fall, Other (comment) Precaution Comments: Loop recorder; left neglect versus visual field cut Restrictions Weight Bearing Restrictions: No    Therapy/Group: Individual Therapy  Amie Critchley 08/15/2021, 5:56 PM

## 2021-08-15 NOTE — Progress Notes (Signed)
Patient slept well last night. Refused bedside cpap. CBG monitored. Denies pain. Assisted to bathroom X1. Safety maintained at all times.

## 2021-08-15 NOTE — Progress Notes (Signed)
Pt has home CPAP. Will call if needing assistance

## 2021-08-15 NOTE — Progress Notes (Signed)
PROGRESS NOTE   Subjective/Complaints: Discussed that creatinine remains improved so there is no need for additional fluids.  Discussed that potassium is optimal today, no need for further supplementation.   ROS: +LUE pain, +left sided neglect, constipation, denies chest pain, +cough.  Objective:   DG Chest 2 View  Result Date: 08/15/2021 CLINICAL DATA:  Cough. EXAM: CHEST - 2 VIEW COMPARISON:  August 02, 2021. FINDINGS: Stable cardiomegaly. Both lungs are clear. The visualized skeletal structures are unremarkable. IMPRESSION: No active cardiopulmonary disease. Electronically Signed   By: Lupita Raider M.D.   On: 08/15/2021 15:30   No results for input(s): WBC, HGB, HCT, PLT in the last 72 hours.  Recent Labs    08/14/21 0520 08/15/21 0632  NA 139 141  K 3.7 4.0  CL 107 109  CO2 23 23  GLUCOSE 149* 157*  BUN 20 20  CREATININE 0.92 0.94  CALCIUM 9.3 9.2    Intake/Output Summary (Last 24 hours) at 08/15/2021 1906 Last data filed at 08/15/2021 1904 Gross per 24 hour  Intake 720 ml  Output --  Net 720 ml        Physical Exam: Vital Signs Blood pressure (!) 144/63, pulse 87, temperature 98.8 F (37.1 C), resp. rate 16, height 5\' 3"  (1.6 m), weight 93.6 kg, SpO2 94 %. Gen: no distress, normal appearing, BMI 36.55 HEENT: oral mucosa pink and moist, NCAT Cardio: Reg rate and rhythm Chest: normal effort, normal rate of breathing Abd: soft, non-distended Ext: no edema Psych: pleasant, flat affect Skin: intact  Neurological/MSK     Comments: Patient is alert.  Makes eye contact with examiner.  No acute distress.  Follows commands.  Provides name age and date of birth.  Fair insight and awareness.  Strength intact except for LUE which has 3/5 hand grasp. Sensation intact. LUE tenderness. Left sided neglect     Assessment/Plan: 1. Functional deficits which require 3+ hours per day of interdisciplinary therapy in a  comprehensive inpatient rehab setting. Physiatrist is providing close team supervision and 24 hour management of active medical problems listed below. Physiatrist and rehab team continue to assess barriers to discharge/monitor patient progress toward functional and medical goals  Care Tool:  Bathing              Bathing assist Assist Level: Minimal Assistance - Patient > 75%     Upper Body Dressing/Undressing Upper body dressing   What is the patient wearing?: Pull over shirt    Upper body assist Assist Level: Minimal Assistance - Patient > 75%    Lower Body Dressing/Undressing Lower body dressing      What is the patient wearing?: Pants     Lower body assist Assist for lower body dressing: Minimal Assistance - Patient > 75%     Toileting Toileting    Toileting assist Assist for toileting: Minimal Assistance - Patient > 75%     Transfers Chair/bed transfer  Transfers assist     Chair/bed transfer assist level: Contact Guard/Touching assist Chair/bed transfer assistive device: Theatre manager   Ambulation assist      Assist level: Contact Guard/Touching assist Assistive device: Cane-straight Max distance:  264ft   Walk 10 feet activity   Assist     Assist level: Minimal Assistance - Patient > 75% Assistive device: Cane-straight   Walk 50 feet activity   Assist    Assist level: Minimal Assistance - Patient > 75% Assistive device: Cane-straight    Walk 150 feet activity   Assist Walk 150 feet activity did not occur: Safety/medical concerns         Walk 10 feet on uneven surface  activity   Assist Walk 10 feet on uneven surfaces activity did not occur: Safety/medical concerns         Wheelchair     Assist Is the patient using a wheelchair?: No   Wheelchair activity did not occur: N/A         Wheelchair 50 feet with 2 turns activity    Assist    Wheelchair 50 feet with 2 turns activity did not  occur: N/A       Wheelchair 150 feet activity     Assist  Wheelchair 150 feet activity did not occur: N/A       Blood pressure (!) 144/63, pulse 87, temperature 98.8 F (37.1 C), resp. rate 16, height 5\' 3"  (1.6 m), weight 93.6 kg, SpO2 94 %.    dical Problem List and Plan: 1. 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  Status post loop recorder             -may not shower until loop recorder incision has heals             -ELOS/Goals: 5-7 days S  Continue CIR  Start B/C vitamin complex to aid stroke recovery.   Messaged team for discharge planning.  2.  Antithrombotics: -DVT/anticoagulation:  Mechanical: Antiembolism stockings, thigh (TED hose) Bilateral lower extremities             -antiplatelet therapy: Aspirin 81 mg daily 3. LUE pain: Tylenol needed. Continue  kpad 4. Mood: Provide emotional support             -antipsychotic agents: N/A 5. Neuropsych: This patient is capable of making decisions on her own behalf. 6. Skin/Wound Care: Routine skin checks 7. Fluids/Electrolytes/Nutrition: Routine in and outs with follow-up chemistries 8.  Hypertension.  Hypotensive, continue Norvac 2.5mg  daily. Monitor with increased mobility. Increase magnesium gluconate to 500mg  HS  9.  Hyperlipidemia.  Lipitor  10.  Diabetes mellitus.  Hemoglobin A1c 7.4.  Presently on SSI.  Patient on Glucotrol XL 5 mg twice daily, Glucophage 1000 mg twice daily, Farxiga 10 mg daily prior to admission.  Increase Metformin to 500mg . Discussed that she will not need insulin at home.  11.  Obesity.  BMI 36.49.  Dietary follow-up 12. AKI: Resolved. Placed nursing order to encourage 6-8 glasses of water per day. Discussed with patient. d/c fluids. Continue to monitor creatinine tomorrow.  13. Hypoalbuminemia: discussed high blood sugars are likely binding to proteins in her blood stream, advised lo sugar/high protein diet.  14. Hypokalemia: supplement 1/21 and 1/25,  repeat tomorrow.  15. Suboptimal Vitamin D: start ergocalciferol 50,000U once per week for 7 weeks.  16. GERD: wean Protonix to 20mg  since no apparent indication. Will maintain this dose since it does help with her GERD. Discussed with her lowering the dose.   17. Constipation: continue magnesium gluconate 250mg HS 18. Slow processing: continue SLP 19. Cognitive deficits: continue SLP 20. Tachycardia: EKG obtained and shows normal sinus rhythm 21. Pneumonia: CXR obtained and stable.  22.  Cough: asked nursing to bring her tea with honey.  LOS: 8 days A FACE TO FACE EVALUATION WAS PERFORMED  Drema Pry Shilo Philipson 08/15/2021, 7:06 PM

## 2021-08-16 ENCOUNTER — Other Ambulatory Visit: Payer: Self-pay

## 2021-08-16 LAB — GLUCOSE, CAPILLARY
Glucose-Capillary: 173 mg/dL — ABNORMAL HIGH (ref 70–99)
Glucose-Capillary: 149 mg/dL — ABNORMAL HIGH (ref 70–99)
Glucose-Capillary: 147 mg/dL — ABNORMAL HIGH (ref 70–99)
Glucose-Capillary: 148 mg/dL — ABNORMAL HIGH (ref 70–99)

## 2021-08-16 NOTE — Progress Notes (Signed)
ILR site stable. Steri-strips removed. Site well healing.   Casimiro Needle [address]" [place], [place]  08/16/2021 7:53 AM

## 2021-08-16 NOTE — Progress Notes (Signed)
PROGRESS NOTE   Subjective/Complaints: Loop recorder site well healing She has no new complaints today CXR stable Declined CPAP last night  ROS: +LUE pain, +left sided neglect, constipation, denies chest pain or shortness of breath, +cough.  Objective:   DG Chest 2 View  Result Date: 08/15/2021 CLINICAL DATA:  Cough. EXAM: CHEST - 2 VIEW COMPARISON:  August 02, 2021. FINDINGS: Stable cardiomegaly. Both lungs are clear. The visualized skeletal structures are unremarkable. IMPRESSION: No active cardiopulmonary disease. Electronically Signed   By: Marijo Conception M.D.   On: 08/15/2021 15:30   No results for input(s): WBC, HGB, HCT, PLT in the last 72 hours.  Recent Labs    08/14/21 0520 08/15/21 0632  NA 139 141  K 3.7 4.0  CL 107 109  CO2 23 23  GLUCOSE 149* 157*  BUN 20 20  CREATININE 0.92 0.94  CALCIUM 9.3 9.2    Intake/Output Summary (Last 24 hours) at 08/16/2021 1253 Last data filed at 08/16/2021 0945 Gross per 24 hour  Intake 960 ml  Output --  Net 960 ml        Physical Exam: Vital Signs Blood pressure 126/61, pulse 84, temperature 98 F (36.7 C), temperature source Oral, resp. rate 16, height 5\' 3"  (1.6 m), weight 93.6 kg, SpO2 93 %. Gen: no distress, normal appearing, BMI 36.55 HEENT: oral mucosa pink and moist, NCAT Cardio: Reg rate and rhythm Chest: normal effort, normal rate of breathing Abd: soft, non-distended Ext: no edema Psych: pleasant, flat affect Skin: loop recorder site well healed  Neurological/MSK     Comments: Patient is alert.  Makes eye contact with examiner.  No acute distress.  Follows commands.  Provides name age and date of birth.  Fair insight and awareness.  Strength intact except for LUE which has 3/5 hand grasp. Sensation intact. LUE tenderness. Left sided neglect     Assessment/Plan: 1. Functional deficits which require 3+ hours per day of interdisciplinary therapy in a  comprehensive inpatient rehab setting. Physiatrist is providing close team supervision and 24 hour management of active medical problems listed below. Physiatrist and rehab team continue to assess barriers to discharge/monitor patient progress toward functional and medical goals  Care Tool:  Bathing              Bathing assist Assist Level: Minimal Assistance - Patient > 75%     Upper Body Dressing/Undressing Upper body dressing   What is the patient wearing?: Pull over shirt    Upper body assist Assist Level: Minimal Assistance - Patient > 75%    Lower Body Dressing/Undressing Lower body dressing      What is the patient wearing?: Pants     Lower body assist Assist for lower body dressing: Minimal Assistance - Patient > 75%     Toileting Toileting    Toileting assist Assist for toileting: Minimal Assistance - Patient > 75%     Transfers Chair/bed transfer  Transfers assist     Chair/bed transfer assist level: Supervision/Verbal cueing Chair/bed transfer assistive device: Research officer, political party   Ambulation assist      Assist level: Contact Guard/Touching assist Assistive device: Cane-straight Max distance:  280ft   Walk 10 feet activity   Assist     Assist level: Contact Guard/Touching assist (w/ RW) Assistive device: Cane-straight   Walk 50 feet activity   Assist    Assist level: Contact Guard/Touching assist (w/ RW) Assistive device: Cane-straight    Walk 150 feet activity   Assist Walk 150 feet activity did not occur: Safety/medical concerns  Assist level: Contact Guard/Touching assist (w/ RW)      Walk 10 feet on uneven surface  activity   Assist Walk 10 feet on uneven surfaces activity did not occur: Safety/medical concerns         Wheelchair     Assist Is the patient using a wheelchair?: No   Wheelchair activity did not occur: N/A         Wheelchair 50 feet with 2 turns activity    Assist     Wheelchair 50 feet with 2 turns activity did not occur: N/A       Wheelchair 150 feet activity     Assist  Wheelchair 150 feet activity did not occur: N/A       Blood pressure 126/61, pulse 84, temperature 98 F (36.7 C), temperature source Oral, resp. rate 16, height 5\' 3"  (1.6 m), weight 93.6 kg, SpO2 93 %.    dical Problem List and Plan: 1. 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  Status post loop recorder             -may not shower until loop recorder incision has heals             -ELOS/Goals: 5-7 days S  Continue CIR  Start B/C vitamin complex to aid stroke recovery.   HFU scheduled for February.  2.  Antithrombotics: -DVT/anticoagulation:  Mechanical: Antiembolism stockings, thigh (TED hose) Bilateral lower extremities             -antiplatelet therapy: Aspirin 81 mg daily 3. LUE pain: Tylenol needed. Continue kpad 4. Mood: Provide emotional support             -antipsychotic agents: N/A 5. Neuropsych: This patient is capable of making decisions on her own behalf. 6. Skin/Wound Care: Routine skin checks 7. Fluids/Electrolytes/Nutrition: Routine in and outs with follow-up chemistries 8.  Hypertension.  Hypotensive, continue Norvac 2.5mg  daily. Monitor with increased mobility. Increase magnesium gluconate to 500mg  HS  9.  Hyperlipidemia.  Lipitor  10.  Diabetes mellitus.  Hemoglobin A1c 7.4.  Presently on SSI.  Patient on Glucotrol XL 5 mg twice daily, Glucophage 1000 mg twice daily, Farxiga 10 mg daily prior to admission.  Increase Metformin to 500mg . Discussed that she will not need insulin at home.  11.  Obesity.  BMI 36.49-->36.55.  Dietary follow-up 12. AKI: Resolved. Placed nursing order to encourage 6-8 glasses of water per day. Discussed with patient. d/c fluids. Continue to monitor creatinine tomorrow.  13. Hypoalbuminemia: discussed high blood sugars are likely binding to proteins in her blood stream, advised lo  sugar/high protein diet.  14. Hypokalemia: supplement 1/21 and 1/25, repeat tomorrow.  15. Suboptimal Vitamin D: start ergocalciferol 50,000U once per week for 7 weeks.  16. GERD: wean Protonix to 20mg  since no apparent indication. Will maintain this dose since it does help with her GERD. Discussed with her lowering the dose.   17. Constipation: continue magnesium gluconate 250mg HS 18. Slow processing: continue SLP 19. Cognitive deficits: continue SLP 20. Tachycardia: EKG obtained and shows normal sinus rhythm 21. Pneumonia: CXR obtained and  stable.  22. Cough: asked nursing to bring her tea with honey.  LOS: 9 days A FACE TO FACE EVALUATION WAS PERFORMED  Izora Ribas 08/16/2021, 12:53 PM

## 2021-08-16 NOTE — Progress Notes (Signed)
Speech Language Pathology Daily Session Note  Patient Details  Name: Alyssa Mayo MRN: [id number] Date of Birth: [birth year]  Today's Date: 08/16/2021 SLP Individual Time: 1445-1525 SLP Individual Time Calculation (min): 40 min  Short Term Goals: Week 2: SLP Short Term Goal 1 (Week 2): continue STG=LTG due to longer than expected length of stay  Skilled Therapeutic Interventions: Skilled treatment session focused on cognitive goals. Upon arrival, patient was awake while supine in bed. Patient agreeable to treatment session and required Mod verbal cues and extra time to initiate sitting EOB. SLP facilitated session by providing Mod verbal cues for left visual scanning and problem solving/error awareness during a complex medication management task in which she had to identify errors within a BID and TID pill box. Patient with decreased awareness regarding difficulty of task. Patient returned to supine at end of session and left with alarm on and all needs within reach. Continue with current plan of care.      Pain No/Denies Pain   Therapy/Group: Individual Therapy  Taria Castrillo 08/16/2021, 3:57 PM

## 2021-08-16 NOTE — Plan of Care (Signed)
Goals downgraded due supervision overall to L inattention and impaired safety awareness   Problem: RH Balance Goal: LTG Patient will maintain dynamic sitting balance (PT) Description: LTG:  Patient will maintain dynamic sitting balance with assistance during mobility activities (PT) Flowsheets (Taken 08/16/2021 1555) LTG: Pt will maintain dynamic sitting balance during mobility activities with:: Supervision/Verbal cueing Goal: LTG Patient will maintain dynamic standing balance (PT) Description: LTG:  Patient will maintain dynamic standing balance with assistance during mobility activities (PT) Flowsheets (Taken 08/16/2021 1555) LTG: Pt will maintain dynamic standing balance during mobility activities with:: Contact Guard/Touching assist   Problem: RH Bed Mobility Goal: LTG Patient will perform bed mobility with assist (PT) Description: LTG: Patient will perform bed mobility with assistance, with/without cues (PT). Flowsheets (Taken 08/16/2021 1555) LTG: Pt will perform bed mobility with assistance level of: Supervision/Verbal cueing   Problem: RH Bed to Chair Transfers Goal: LTG Patient will perform bed/chair transfers w/assist (PT) Description: LTG: Patient will perform bed to chair transfers with assistance (PT). Flowsheets (Taken 08/16/2021 1555) LTG: Pt will perform Bed to Chair Transfers with assistance level: Supervision/Verbal cueing   Problem: RH Car Transfers Goal: LTG Patient will perform car transfers with assist (PT) Description: LTG: Patient will perform car transfers with assistance (PT). Flowsheets (Taken 08/16/2021 1555) LTG: Pt will perform car transfers with assist:: Supervision/Verbal cueing   Problem: RH Ambulation Goal: LTG Patient will ambulate in home environment (PT) Description: LTG: Patient will ambulate in home environment, # of feet with assistance (PT). Flowsheets (Taken 08/16/2021 1555) LTG: Pt will ambulate in home environ  assist needed::  Supervision/Verbal cueing LTG: Ambulation distance in home environment: 138ft   Problem: RH Stairs Goal: LTG Patient will ambulate up and down stairs w/assist (PT) Description: LTG: Patient will ambulate up and down # of stairs with assistance (PT) Flowsheets (Taken 08/16/2021 1555) LTG: Pt will ambulate up/down stairs assist needed:: Contact Guard/Touching assist LTG: Pt will  ambulate up and down number of stairs: 4   Problem: RH Ambulation Goal: LTG Patient will ambulate in controlled environment (PT) Description: LTG: Patient will ambulate in a controlled environment, # of feet with assistance (PT). Flowsheets (Taken 08/16/2021 1555) LTG: Pt will ambulate in controlled environ  assist needed:: Supervision/Verbal cueing LTG: Ambulation distance in controlled environment: 137ft

## 2021-08-16 NOTE — Progress Notes (Addendum)
Occupational Therapy Weekly Progress Note  Patient Details  Name: Alyssa Mayo MRN: [id number] Date of Birth: 16-Jul-1946  Beginning of progress report period: August 08, 2021 End of progress report period: August 15, 2021  Today's Date: 08/16/2021 OT Individual Time: 0945-1100 OT Individual Time Calculation (min): 75 min    Patient has met  0 of 7 long term goals and making slow but steady gains.  Pt's primary barriers to progress are impaired divided attention, impaired left attention and left neglect, impaired coordination and strength LUE, and mild impairments in safety awareness and problem solving.  Pt requires min assist for LB self care primarily due to weakness and neglect of LUE and difficulty pulling pants and brief over left hip as well as thread LUE through shirt.  Pt does also need cueing to avoid obstacles on left during functional mobility. Pt will likely require 24 hour supervision at discharge due to cognitive and safety awareness deficits.  Pt is motivated to progress in order to return home to be with her grandson who lives with her and would benefit from further skilled training to maximize progress and independence.   Patient continues to demonstrate the following deficits: muscle weakness, decreased cardiorespiratoy endurance, motor apraxia, decreased coordination, and decreased motor planning, decreased visual perceptual skills, decreased attention to left, left side neglect, and decreased motor planning, decreased initiation, decreased attention, decreased awareness, decreased problem solving, decreased safety awareness, decreased memory, and delayed processing, and decreased sitting balance, decreased standing balance, decreased postural control, hemiplegia, and decreased balance strategies and therefore will continue to benefit from skilled OT intervention to enhance overall performance with BADL and iADL.  Patient  is progressing slower than expected towards goals .   Continue plan of care.  OT Short Term Goals Week 1:  OT Short Term Goal 1 (Week 1): STGs=LTGs due to ELOS OT Short Term Goal 4 (Week 1): STGs=LTGs due to ELOS Week 2:  OT Short Term Goal 1 (Week 2): STGs=LTGs due to ELOS  Skilled Therapeutic Interventions/Progress Updates:    Pt sitting up in recliner reporting she doesn't need to wash up or change clothes but agreeable to work on strengthening her left arm. Pt ambulated approximately 150 feet using SPC to large gym and stand to sit on EOM.  During ambulation pt required mod to max cues to attend to left side of her environment and CGA due to increased distractibility on right side when in busy environment.  Pt completed supine shoulder flexion to 90 degrees and triceps press using 2 lb free weight then sidelying ER both 3 x 10 reps. Pt complaining after this of feeling cold.  Ambulated using SPC back to room with CGA.  Pt retrieved jacket from overhead shelf in closet with CGA and donned with min assist.  Pt requesting to use bathroom and ambulated with CGA and cues to attend to door frame.  Supervision for toilet transfer and toileting.  Ambulated to recliner needing multimodal cues to attend to door frame on left.  Call bell in reach, seat alarm on on.    Therapy Documentation Precautions:  Precautions Precautions: Fall, Other (comment) Precaution Comments: Loop recorder; left neglect versus visual field cut Restrictions Weight Bearing Restrictions: No    Therapy/Group: Individual Therapy  Ezekiel Slocumb 08/16/2021, 4:23 PM

## 2021-08-16 NOTE — Progress Notes (Signed)
Physical Therapy Session Note  Patient Details  Name: Alyssa Mayo MRN: [id number] Date of Birth: 1945/08/06  Today's Date: 08/16/2021 PT Individual Time: 0800-0845, 1315-1400 PT Individual Time Calculation (min): 45 min + 45 min  Short Term Goals: Week 2:  PT Short Term Goal 1 (Week 2): STG=LTG due to ELOS  Skilled Therapeutic Interventions/Progress Updates:    Session 1 Pt met sitting in recliner, agreeable and ready for PT. Pt donned shoes w/ maxA for time and ambulated w/ SPC to 48M rehab gym ~144ft CGA. Hr 115 after ambulation and required frequent sitting rest breaks after each task. Completed standing, reaching w/ LUE, and clothes pin sequencing x3 to basketball net to L of pt w/ CGA.  Net moved in front of pt and airex added during second set w/ minA for balance due to posterior lean. HR increased to 125 after dynamic balance activity. ~85ft ambulation w/ 4 L turns w/ SPC CGA and pt stopping to pick up 2 blocks from ground when cued. Pt maintained balance w/ CGA during anterior trunk lean but unable to hold SPC in L hand to pick object up w/ R hand. After seated rest break, 5 cone weave added to ~65ft ambulation w/ block at end to pick up, pt demonstrated no LOB w/ weaves. W/o rest break, pt ambulated ~85ft while stepping over cones ~62ft apart. Pt had difficulty w/ step over cones as she stopped gait and stepped to L of cone unable to clear cone each time. Ambulated back to room w/ SPC CGA ~129ft. Transferred to recliner, seat belt alarm on, call bell next to pt, all needs met.  Session 2 Pt met lying in bed and agreeable to PT. Pt had putty stuck to shirt and pants once blankets were removed. Donned new pants and cleaned shirt w/ damp washcloth. Pt donned shoes minA for heel clearance and recommended pt use shoe horn at home which pt stated she had. Pt ambulated w/ SPC CGA to 48M rehab gym ~139ft. HR 115 and consistently after each standing activity w/ recovery to 95-100 after rest.  Standing on airex pad matching cards to back of mirror completed minA for dynamic standing balance and visual scanning. Pt ambulated SPC w/ CGA to ortho gym to complete BITS visual memory and scanning activities standing on airex minA for balance. Pt demonstrated improved L visual scanning during BITS and card matching required fewer VC for missed items. Pt had difficulty w/ short term memory w/ complex lines, frequently missing lines by 1 column. Pt ambulated back to room w/ RW CGA due to distance and to promote faster gait speed ~263ft. Pt transferred to bed from standing w/ CGA and left lying in bed w/ call bell, bed alarm on and all needs met.  Therapy Documentation Precautions:  Precautions Precautions: Fall, Other (comment) Precaution Comments: Loop recorder; left neglect versus visual field cut Restrictions Weight Bearing Restrictions: No General:     Therapy/Group: Individual Therapy  Lucile Shutters, SPT  08/16/2021, 7:49 AM

## 2021-08-16 NOTE — Progress Notes (Signed)
Pt declining use of CPAP through the night.

## 2021-08-17 DIAGNOSIS — E1169 Type 2 diabetes mellitus with other specified complication: Secondary | ICD-10-CM

## 2021-08-17 DIAGNOSIS — E669 Obesity, unspecified: Secondary | ICD-10-CM

## 2021-08-17 DIAGNOSIS — I63511 Cerebral infarction due to unspecified occlusion or stenosis of right middle cerebral artery: Secondary | ICD-10-CM

## 2021-08-17 DIAGNOSIS — K5901 Slow transit constipation: Secondary | ICD-10-CM

## 2021-08-17 DIAGNOSIS — N179 Acute kidney failure, unspecified: Secondary | ICD-10-CM

## 2021-08-17 LAB — GLUCOSE, CAPILLARY
Glucose-Capillary: 164 mg/dL — ABNORMAL HIGH (ref 70–99)
Glucose-Capillary: 122 mg/dL — ABNORMAL HIGH (ref 70–99)
Glucose-Capillary: 224 mg/dL — ABNORMAL HIGH (ref 70–99)
Glucose-Capillary: 291 mg/dL — ABNORMAL HIGH (ref 70–99)

## 2021-08-17 NOTE — Progress Notes (Signed)
Pt again declined CPAP tonight.

## 2021-08-17 NOTE — Progress Notes (Signed)
PROGRESS NOTE   Subjective/Complaints: Pt has problems sleeping but "I've had that for awhile". Refused CPAP last night. Asked about her CXR results  ROS: Patient denies fever, rash, sore throat, blurred vision, dizziness, nausea, vomiting, diarrhea, cough, shortness of breath or chest pain, joint or back/neck pain, headache, or mood change.   Objective:   DG Chest 2 View  Result Date: 08/15/2021 CLINICAL DATA:  Cough. EXAM: CHEST - 2 VIEW COMPARISON:  August 02, 2021. FINDINGS: Stable cardiomegaly. Both lungs are clear. The visualized skeletal structures are unremarkable. IMPRESSION: No active cardiopulmonary disease. Electronically Signed   By: Lupita Raider M.D.   On: 08/15/2021 15:30   No results for input(s): WBC, HGB, HCT, PLT in the last 72 hours.  Recent Labs    08/15/21 0632  NA 141  K 4.0  CL 109  CO2 23  GLUCOSE 157*  BUN 20  CREATININE 0.94  CALCIUM 9.2    Intake/Output Summary (Last 24 hours) at 08/17/2021 1214 Last data filed at 08/17/2021 0805 Gross per 24 hour  Intake 600 ml  Output --  Net 600 ml        Physical Exam: Vital Signs Blood pressure (!) 145/67, pulse 91, temperature 98.4 F (36.9 C), temperature source Oral, resp. rate 18, height 5\' 3"  (1.6 m), weight 93.6 kg, SpO2 95 %. Constitutional: No distress . Vital signs reviewed. HEENT: NCAT, EOMI, oral membranes moist Neck: supple Cardiovascular: RRR without murmur. No JVD    Respiratory/Chest: CTA Bilaterally without wheezes or rales. Normal effort    GI/Abdomen: BS +, non-tender, non-distended Ext: no clubbing, cyanosis, or edema Psych: pleasant and cooperative, a little flat Skin: loop recorder site well healed. Skin intact otherwise  Neurological/MSK     Comments: Patient is alert.  Makes eye contact with examiner.  No acute distress.  Follows commands.  Provides name age and date of birth.  Fair insight and awareness.  Strength  intact except for LUE which has 3/5 hand grasp. Sensation intact. LUE tenderness. Left sided neglect ongoing     Assessment/Plan: 1. Functional deficits which require 3+ hours per day of interdisciplinary therapy in a comprehensive inpatient rehab setting. Physiatrist is providing close team supervision and 24 hour management of active medical problems listed below. Physiatrist and rehab team continue to assess barriers to discharge/monitor patient progress toward functional and medical goals  Care Tool:  Bathing              Bathing assist Assist Level: Minimal Assistance - Patient > 75%     Upper Body Dressing/Undressing Upper body dressing   What is the patient wearing?: Pull over shirt    Upper body assist Assist Level: Minimal Assistance - Patient > 75%    Lower Body Dressing/Undressing Lower body dressing      What is the patient wearing?: Pants     Lower body assist Assist for lower body dressing: Minimal Assistance - Patient > 75%     Toileting Toileting    Toileting assist Assist for toileting: Minimal Assistance - Patient > 75%     Transfers Chair/bed transfer  Transfers assist     Chair/bed transfer assist level: Supervision/Verbal  cueing Chair/bed transfer assistive device: Gaffer assist      Assist level: Contact Guard/Touching assist Assistive device: Cane-straight Max distance: 253ft   Walk 10 feet activity   Assist     Assist level: Contact Guard/Touching assist (w/ RW) Assistive device: Cane-straight   Walk 50 feet activity   Assist    Assist level: Contact Guard/Touching assist (w/ RW) Assistive device: Cane-straight    Walk 150 feet activity   Assist Walk 150 feet activity did not occur: Safety/medical concerns  Assist level: Contact Guard/Touching assist (w/ RW)      Walk 10 feet on uneven surface  activity   Assist Walk 10 feet on uneven surfaces activity did not  occur: Safety/medical concerns         Wheelchair     Assist Is the patient using a wheelchair?: No   Wheelchair activity did not occur: N/A         Wheelchair 50 feet with 2 turns activity    Assist    Wheelchair 50 feet with 2 turns activity did not occur: N/A       Wheelchair 150 feet activity     Assist  Wheelchair 150 feet activity did not occur: N/A       Blood pressure (!) 145/67, pulse 91, temperature 98.4 F (36.9 C), temperature source Oral, resp. rate 18, height 5\' 3"  (1.6 m), weight 93.6 kg, SpO2 95 %.    dical Problem List and Plan: 1. 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  Status post loop recorder             -may not shower until loop recorder incision has heals             -ELOS/Goals: 5-7 days S  -Continue CIR therapies including PT, OT, and SLP   Start B/C vitamin complex to aid stroke recovery.   HFU scheduled for February.  2.  Antithrombotics: -DVT/anticoagulation:  Mechanical: Antiembolism stockings, thigh (TED hose) Bilateral lower extremities             -antiplatelet therapy: Aspirin 81 mg daily 3. LUE pain: Tylenol needed. Continue kpad 4. Mood: Provide emotional support             -antipsychotic agents: N/A 5. Neuropsych: This patient is capable of making decisions on her own behalf. 6. Skin/Wound Care: Routine skin checks 7. Fluids/Electrolytes/Nutrition: Routine in and outs with follow-up chemistries 8.  Hypertension.  Hypotensive, continue Norvac 2.5mg  daily. Monitor with increased mobility. Increase magnesium gluconate to 500mg  HS   1/28 bp under reasonable control 9.  Hyperlipidemia.  Lipitor  10.  Diabetes mellitus.  Hemoglobin A1c 7.4.  Presently on SSI.  Patient on Glucotrol XL 5 mg twice daily, Glucophage 1000 mg twice daily, Farxiga 10 mg daily prior to admission.  Increase Metformin to 500mg . Discussed that she will not need insulin at home.   CBG (last 3)  Recent  Labs    08/16/21 2128 08/17/21 0620 08/17/21 1207  GLUCAP 148* 164* 122*    1/28 reasonable control currently 11.  Obesity.  BMI 36.49-->36.55.  Dietary follow-up 12. AKI: Resolved. Pushing fluids 13. Hypoalbuminemia: discussed high blood sugars are likely binding to proteins in her blood stream, advised lo sugar/high protein diet.  14. Hypokalemia: supplement 1/21 and 1/25, 4.0  15. Suboptimal Vitamin D: start ergocalciferol 50,000U once per week for 7 weeks.  16. GERD: wean Protonix to 20mg  since no apparent  indication. Will maintain this dose since it does help with her GERD. Discussed with her lowering the dose.   17. Constipation: continue magnesium gluconate 250mg HS  -BM 1/28 18. Slow processing: continue SLP 19. Cognitive deficits: continue SLP 20. Tachycardia: EKG obtained and shows normal sinus rhythm 21. Pneumonia: CXR obtained and stable. Discussed with pt 1/28 22. Cough: asked nursing to bring her tea with honey.  LOS: 10 days A FACE TO FACE EVALUATION WAS PERFORMED  2/28 08/17/2021, 12:14 PM

## 2021-08-18 LAB — GLUCOSE, CAPILLARY
Glucose-Capillary: 161 mg/dL — ABNORMAL HIGH (ref 70–99)
Glucose-Capillary: 172 mg/dL — ABNORMAL HIGH (ref 70–99)
Glucose-Capillary: 147 mg/dL — ABNORMAL HIGH (ref 70–99)
Glucose-Capillary: 173 mg/dL — ABNORMAL HIGH (ref 70–99)

## 2021-08-18 NOTE — Progress Notes (Signed)
Occupational Therapy Session Note  Patient Details  Name: Alyssa Mayo MRN: [id number] Date of Birth: August 05, 1945  Today's Date: 08/18/2021 OT Individual Time: 1240-1340 OT Individual Time Calculation (min): 60 min    Short Term Goals: Week 1:  OT Short Term Goal 1 (Week 1): STGs=LTGs due to ELOS OT Short Term Goal 4 (Week 1): STGs=LTGs due to ELOS Week 2:  OT Short Term Goal 1 (Week 2): STGs=LTGs due to ELOS  Skilled Therapeutic Interventions/Progress Updates:   The pt completed BADL related task in showering, dressing , and washing her hair with s/u assist to MinA  incorporating her cane for functional mobility to the shower.  The pt was MinA for doffing her UB/LB clothing items for showering, she was MinA for bathing her bottom  while incorporating the grab bars to assist with functional balance during standing.  She was able to wash and dry her UB/LB with MinA , requiring the same level of assist for donning her UB/LB clothing item inclusive of pull over top and underwear and pants after applying deodorant and lotion  with s/u assist. The pt was able to provide MinA for drying and combing her hair, she was able to complete functional transfers from the w/c to the bed with CGA and verbal prompts for managing her LE for  a safe return to bed.  The pt's with alarm was  activated, her bedside table  and call light were within reach with no c/o of pain.  All addition concerns were addressed prior to leaving.  Precautions:  Precautions Precautions: Fall, Other (comment) Precaution Comments: Loop recorder; left neglect versus visual field cut Restrictions Weight Bearing Restrictions: No General:   Vital Signs: Therapy Vitals Temp: 98.6 F (37 C) Temp Source: Oral Pulse Rate: 98 Resp: 18 BP: (!) 120/53 Patient Position (if appropriate): Lying Oxygen Therapy SpO2: 96 % O2 Device: Room Air Pain: Pain Assessment Pain Scale: Faces Faces Pain Scale: No hurt  Vision   Perception     Praxis   Balance   Exercises:   Other Treatments:     Therapy/Group: Individual Therapy  Lavona Mound 08/18/2021, 6:05 PM

## 2021-08-18 NOTE — Progress Notes (Signed)
Speech Language Pathology Daily Session Note  Patient Details  Name: Quinetta Shilling MRN: [id number] Date of Birth: [birth year]  Today's Date: 08/18/2021 SLP Individual Time: 1415-1500 SLP Individual Time Calculation (min): 45 min  Short Term Goals: Week 2: SLP Short Term Goal 1 (Week 2): continue STG=LTG due to longer than expected length of stay  Skilled Therapeutic Interventions: Skilled SLP intervention focused on cognition. Pt sat at EOB and wrote out medication list with mod A for scanning. She frequently placed information in incorrect columns and needed visual cues to increase accuracy. She was able to recall purpose for medications with supervision A. Pt completed simple time calculations using therapy schedule with supervision A and increased time for processing information and minor errors in reading numbers printed for times. She was assisted back in bed and left lying in bed with bed alarm set and call button within reach. Cont with therapy per plan of care.      Pain Pain Assessment Pain Scale: Faces Faces Pain Scale: No hurt  Therapy/Group: Individual Therapy  Carlean Jews Staphanie Harbison 08/18/2021, 3:20 PM

## 2021-08-18 NOTE — Progress Notes (Signed)
Pt has home CPAP unit. ?

## 2021-08-19 LAB — GLUCOSE, CAPILLARY
Glucose-Capillary: 148 mg/dL — ABNORMAL HIGH (ref 70–99)
Glucose-Capillary: 157 mg/dL — ABNORMAL HIGH (ref 70–99)
Glucose-Capillary: 139 mg/dL — ABNORMAL HIGH (ref 70–99)
Glucose-Capillary: 182 mg/dL — ABNORMAL HIGH (ref 70–99)

## 2021-08-19 NOTE — Progress Notes (Signed)
Physical Therapy Session Note  Patient Details  Name: Alyssa Mayo MRN: [id number] Date of Birth: Oct 24, 1945  Today's Date: 08/19/2021 PT Individual Time: 1530-1605 PT Individual Time Calculation (min): 35 min   Short Term Goals: Week 2:  PT Short Term Goal 1 (Week 2): STG=LTG due to ELOS  Skilled Therapeutic Interventions/Progress Updates:    Pt received seated in recliner in room, agreeable to PT session. No complaints of pain. Sit to stand with close Supervision, stand pivot transfer to w/c with SPC and CGA for balance. Pt taken outdoors for improved pt mood and therapy buy-in as well as functional gait training. Ambulation outdoors up to 100 ft with use of SPC with CGA for balance navigating up/down inclines and across uneven ground. Pt reports urge to toilet. Taken back upstairs. Toilet transfer with SPC and CGA, pt requires some assist for clothing management on L side due to UE weakness. Pt requests to return to bed due to fatigue. Bed mobility Supervision. Pt left in R sidelying in bed with needs in reach at end of session.  Therapy Documentation Precautions:  Precautions Precautions: Fall, Other (comment) Precaution Comments: Loop recorder; left neglect versus visual field cut Restrictions Weight Bearing Restrictions: No      Therapy/Group: Individual Therapy   Peter Congo, PT, DPT, CSRS  08/19/2021, 4:21 PM

## 2021-08-19 NOTE — Progress Notes (Signed)
Physical Therapy Session Note  Patient Details  Name: Alyssa Mayo MRN: [id number] Date of Birth: 09-Jan-1946  Today's Date: 08/19/2021 PT Individual Time: 0902-1000 PT Individual Time Calculation (min): 58 min   Short Term Goals: Week 2:  PT Short Term Goal 1 (Week 2): STG=LTG due to ELOS  Skilled Therapeutic Interventions/Progress Updates:    Pt met asleep in bed, no pain, slow to awake, but agreeable to session. HR maintained 115-120 after short bursts of activity and decreased to 100 w/ seated rest breaks. Frequent seated rest breaks given after each activity throughout session. Supine<>sit EOB w/ S, bed flat and use of R hand rail. Pt donned shoes w/ modA for time and ambulated to 35M rehab gym w/ SPC CGA. HR 120 and pt demonstrated understanding of pulse ox reading as she has one at home. 5xSTS x3 completed CGA w/ times listed below. Rebounder ball toss w/ light blue ball on airex FSWA completed w/ CGA 1x15. Pt demonstrated appropriate hip and ankle strategies w/ 1 LOB, and demonstrated gross motor of LUE. Pt had difficulty w/ fine motor LUE during rebounder. FGA completed 15/30 listed below. Pt requested seated rest break halfway through FGA and demonstrated L inattention when cued to sit in chair on her L. Pt demonstrated decreased gait speed during head turns, ambulating backwards, and stepping over objects. Pt educated on results and recommendation of using SPC w/ S at home and RW in community w/ S, pt voiced understanding. Pt reported some vertigo w/ head turns that relieved w/ rest. Pt stated this has been an issue prior to hospitalization. Finished session w/ TUG w/ SPC CGA, times listed below. Pt ambulated back to room ~140ft w/ SPC CGA transferred to chair, call bell in hand, and all needs met. Pt continues to demonstrate L inattention, mild impulsiveness, and difficulty w/ safety awareness. Plan to complete BERG and d/c planning next session due to upcoming family training on  2/1.  5XSTS - 1-21s, 2-19s, 3- N/A as pt stopped midway to fix pants *5xSTS > 15s = increased falls risk  FGA - 15/30 *FGA < 22/30 = increased falls risk  TUG - 24.5s *TUG > 13.5s = increased falls risk   Therapy Documentation Precautions:  Precautions Precautions: Fall, Other (comment) Precaution Comments: Loop recorder; left neglect versus visual field cut Restrictions Weight Bearing Restrictions: No General:   Balance: Balance Balance Assessed: Yes Standardized Balance Assessment Standardized Balance Assessment: Functional Gait Assessment Functional Gait  Assessment Gait assessed : Yes Gait Level Surface: Walks 20 ft in less than 7 sec but greater than 5.5 sec, uses assistive device, slower speed, mild gait deviations, or deviates 6-10 in outside of the 12 in walkway width. Change in Gait Speed: Able to change speed, demonstrates mild gait deviations, deviates 6-10 in outside of the 12 in walkway width, or no gait deviations, unable to achieve a major change in velocity, or uses a change in velocity, or uses an assistive device. Gait with Horizontal Head Turns: Performs head turns with moderate changes in gait velocity, slows down, deviates 10-15 in outside 12 in walkway width but recovers, can continue to walk. Gait with Vertical Head Turns: Performs task with moderate change in gait velocity, slows down, deviates 10-15 in outside 12 in walkway width but recovers, can continue to walk. Gait and Pivot Turn: Pivot turns safely in greater than 3 sec and stops with no loss of balance, or pivot turns safely within 3 sec and stops with mild imbalance, requires small  steps to catch balance. Step Over Obstacle: Is able to step over one shoe box (4.5 in total height) but must slow down and adjust steps to clear box safely. May require verbal cueing. Gait with Narrow Base of Support: Ambulates 7-9 steps. Gait with Eyes Closed: Walks 20 ft, slow speed, abnormal gait pattern, evidence for  imbalance, deviates 10-15 in outside 12 in walkway width. Requires more than 9 sec to ambulate 20 ft. Ambulating Backwards: Walks 20 ft, uses assistive device, slower speed, mild gait deviations, deviates 6-10 in outside 12 in walkway width. Steps: Two feet to a stair, must use rail. Total Score: 15 FGA comment:: with SPC Exercises:   Other Treatments:      Therapy/Group: Individual Therapy  Lucile Shutters, SPT  08/19/2021, 11:51 AM

## 2021-08-19 NOTE — Progress Notes (Signed)
Speech Language Pathology Daily Session Note  Patient Details  Name: Alyssa Mayo MRN: [id number] Date of Birth: March 18, 1946  Today's Date: 08/19/2021 SLP Individual Time: 1694-5038 SLP Individual Time Calculation (min): 40 min  Short Term Goals: Week 2: SLP Short Term Goal 1 (Week 2): continue STG=LTG due to longer than expected length of stay  Skilled Therapeutic Interventions:Skilled ST services focused on cognitive skills. SLP facilitated mildly complex problem solving, recall and error awareness in account balancing task. Pt required supervision A verbal cues for calculation errors, but required mod A verbal to note errors in organization even when task was attempted a second time. Pt demonstrated increase carryover of errors awareness in 4 step ADL picture card task when educated on "thinking aloud" strategy requiring mod A fade to min A verbal cues. SLP downgraded cognitive goals to min A to reflect slow progress and poor carryover due to reduced awareness of impact of deficits. Pt was agreeable to allowing son to assist in bill paying at d/c. Pt was left in room with call bell within reach and chair alarm set. SLP recommends to continue skilled services.     Pain Pain Assessment Pain Score: 0-No pain  Therapy/Group: Individual Therapy  Tanvir Hipple  [facility] 08/19/2021, 12:45 PM

## 2021-08-19 NOTE — Progress Notes (Signed)
Occupational Therapy Session Note  Patient Details  Name: Alyssa Mayo MRN: [id number] Date of Birth: 1945/10/20  Today's Date: 08/19/2021 OT Individual Time: 1340-1435 OT Individual Time Calculation (min): 55 min    Short Term Goals: Week 2:  OT Short Term Goal 1 (Week 2): STGs=LTGs due to ELOS  Skilled Therapeutic Interventions/Progress Updates:    Pt asleep in bed, easily awoken.  Pt politely declining showering today, requesting to go outside.  Supine to sit using bed features with supervision.  Sit to stand using SPC, pt ambulated to bathroom and completed toilet transfer using grab bar.  Pt had continent episode of urine.  3/3 toileting with supervision to attend to left side of pants pulling up over hips.  Pt ambulated to w/c at bedside needing cues to attend to left doorframe (pts left shoulder brushing doorframe due to inattention).  Pt transported via w/c to [employer] outside.  Completed 3 x 15 reps shoulder ER, lats, and triceps press using min resistive band. Needing intermittent tactile cues to improve body mechanics.  Pt then retrieved 8/8 beanbags placed in various positions and heights on porch at ambulation level using SPC and needing CGA and min cues to attend to left environment.  Stand to sit with supervision at w/c and transported back to room.  Stand pivot to recliner with supervision.  Call bell in reach, seat alrm on.   Therapy Documentation Precautions:  Precautions Precautions: Fall, Other (comment) Precaution Comments: Loop recorder; left neglect versus visual field cut Restrictions Weight Bearing Restrictions: No    Therapy/Group: Individual Therapy  Amie Critchley 08/19/2021, 2:50 PM

## 2021-08-19 NOTE — Progress Notes (Signed)
PROGRESS NOTE   Subjective/Complaints: No new complaints today Discussed CXR results stable.  Discussed elevated CBGs, but better than admission  ROS: Patient denies fever, rash, sore throat, blurred vision, dizziness, nausea, vomiting, diarrhea, cough, shortness of breath or chest pain, joint or back/neck pain, headache, or mood change.   Objective:   No results found. No results for input(s): WBC, HGB, HCT, PLT in the last 72 hours.  No results for input(s): NA, K, CL, CO2, GLUCOSE, BUN, CREATININE, CALCIUM in the last 72 hours.   Intake/Output Summary (Last 24 hours) at 08/19/2021 1232 Last data filed at 08/18/2021 2300 Gross per 24 hour  Intake 420 ml  Output --  Net 420 ml        Physical Exam: Vital Signs Blood pressure (!) 159/75, pulse 90, temperature 98.5 F (36.9 C), temperature source Oral, resp. rate 16, height 5\' 3"  (1.6 m), weight 93.6 kg, SpO2 94 %. Gen: no distress, normal appearing HEENT: oral mucosa pink and moist, NCAT Cardio: Reg rate Chest: normal effort, normal rate of breathing Abd: soft, non-distended Ext: no edema  Psych: pleasant and cooperative, a little flat Skin: loop recorder site well healed. Skin intact otherwise  Neurological/MSK     Comments: Patient is alert.  Makes eye contact with examiner.  No acute distress.  Follows commands.  Provides name age and date of birth.  Fair insight and awareness.  Strength intact except for LUE which has 3/5 hand grasp. Sensation intact. LUE tenderness. Left sided neglect ongoing     Assessment/Plan: 1. Functional deficits which require 3+ hours per day of interdisciplinary therapy in a comprehensive inpatient rehab setting. Physiatrist is providing close team supervision and 24 hour management of active medical problems listed below. Physiatrist and rehab team continue to assess barriers to discharge/monitor patient progress toward functional  and medical goals  Care Tool:  Bathing              Bathing assist Assist Level: Minimal Assistance - Patient > 75%     Upper Body Dressing/Undressing Upper body dressing   What is the patient wearing?: Pull over shirt    Upper body assist Assist Level: Minimal Assistance - Patient > 75%    Lower Body Dressing/Undressing Lower body dressing      What is the patient wearing?: Pants     Lower body assist Assist for lower body dressing: Minimal Assistance - Patient > 75%     Toileting Toileting    Toileting assist Assist for toileting: Minimal Assistance - Patient > 75%     Transfers Chair/bed transfer  Transfers assist     Chair/bed transfer assist level: Supervision/Verbal cueing Chair/bed transfer assistive device:   Ambulation assist      Assist level: Contact Guard/Touching assist Assistive device: Cane-straight Max distance: 288ft   Walk 10 feet activity   Assist     Assist level: Contact Guard/Touching assist (w/ RW) Assistive device: Cane-straight   Walk 50 feet activity   Assist    Assist level: Contact Guard/Touching assist (w/ RW) Assistive device: Cane-straight    Walk 150 feet activity   Assist Walk 150 feet activity did  not occur: Safety/medical concerns  Assist level: Contact Guard/Touching assist (w/ RW)      Walk 10 feet on uneven surface  activity   Assist Walk 10 feet on uneven surfaces activity did not occur: Safety/medical concerns         Wheelchair     Assist Is the patient using a wheelchair?: No   Wheelchair activity did not occur: N/A         Wheelchair 50 feet with 2 turns activity    Assist    Wheelchair 50 feet with 2 turns activity did not occur: N/A       Wheelchair 150 feet activity     Assist  Wheelchair 150 feet activity did not occur: N/A       Blood pressure (!) 159/75, pulse 90, temperature 98.5 F (36.9 C), temperature source  Oral, resp. rate 16, height 5\' 3"  (1.6 m), weight 93.6 kg, SpO2 94 %.    dical Problem List and Plan: 1. 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  Status post loop recorder             -may not shower until loop recorder incision has heals             -ELOS/Goals: 5-7 days S  -Continue CIR therapies including PT, OT, and SLP   Start B/C vitamin complex to aid stroke recovery.   HFU scheduled for February.  2.  Antithrombotics: -DVT/anticoagulation:  Mechanical: Antiembolism stockings, thigh (TED hose) Bilateral lower extremities             -antiplatelet therapy: Aspirin 81 mg daily 3. LUE pain: Tylenol needed. Continue kpad 4. Mood: Provide emotional support             -antipsychotic agents: N/A 5. Neuropsych: This patient is capable of making decisions on her own behalf. 6. Skin/Wound Care: Routine skin checks 7. Fluids/Electrolytes/Nutrition: Routine in and outs with follow-up chemistries 8.  Hypertension.  Hypotensive, continue Norvac 2.5mg  daily. Monitor with increased mobility. Increase magnesium gluconate to 500mg  HS   1/28 bp under reasonable control 9.  Hyperlipidemia.  Lipitor  10.  Diabetes mellitus.  Hemoglobin A1c 7.4.  Presently on SSI.  Patient on Glucotrol XL 5 mg twice daily, Glucophage 1000 mg twice daily, Farxiga 10 mg daily prior to admission.  Increase Metformin to 500mg . Discussed that she will not need insulin at home. Discussed improvements in Cbgs with her.   CBG (last 3)  Recent Labs    08/18/21 2129 08/19/21 0613 08/19/21 1205  GLUCAP 173* 148* 157*   11.  Obesity.  BMI 36.49-->36.55.  Dietary follow-up 12. AKI: Resolved. Pushing fluids 13. Hypoalbuminemia: discussed high blood sugars are likely binding to proteins in her blood stream, advised lo sugar/high protein diet.  14. Hypokalemia: supplement 1/21 and 1/25, 4.0  15. Suboptimal Vitamin D: start ergocalciferol 50,000U once per week for 7 weeks.  16. GERD:  wean Protonix to 20mg  since no apparent indication. Will maintain this dose since it does help with her GERD. Discussed with her lowering the dose.   17. Constipation: continue magnesium gluconate 250mg HS  -BM 1/28 18. Slow processing: continue SLP 19. Cognitive deficits: continue SLP 20. Tachycardia: EKG obtained and shows normal sinus rhythm 21. Pneumonia: CXR obtained and stable. Discussed results with her.  22. Cough: asked nursing to bring her tea with honey.  LOS: 12 days A FACE TO FACE EVALUATION WAS PERFORMED  2/21 Davon Folta 08/19/2021, 12:32 PM

## 2021-08-20 LAB — GLUCOSE, CAPILLARY
Glucose-Capillary: 159 mg/dL — ABNORMAL HIGH (ref 70–99)
Glucose-Capillary: 139 mg/dL — ABNORMAL HIGH (ref 70–99)
Glucose-Capillary: 151 mg/dL — ABNORMAL HIGH (ref 70–99)
Glucose-Capillary: 201 mg/dL — ABNORMAL HIGH (ref 70–99)

## 2021-08-20 NOTE — Progress Notes (Signed)
Physical Therapy Session Note  Patient Details  Name: Alyssa Mayo MRN: [id number] Date of Birth: April 06, 1946  Today's Date: 08/20/2021 PT Individual Time: 1001-1100 and 1430-1530 PT Individual Time Calculation (min): 59 min and 60 min.  Short Term Goals: Week 2:  PT Short Term Goal 1 (Week 2): STG=LTG due to ELOS  Skilled Therapeutic Interventions/Progress Updates:   First session:  Pt presents supine in bed and agreeable to therapy.  Pt transfers sidelying to sit w/ CGA.  Pt sable to slip into shoes in sitting.  Pt amb w/ SPC  and CGA to close supervision to sink and brushed hair.  Pt amb 150' w/ SPC to dayroom w/ CGA to close sup.  Pt negotiated obstacle course around cones and stepping over canes w/ SPC as well as performing w/o AD.  Pt attentive to left 2/2 close turns w/ 1 touch of cone to left.  Pt amb to pick up canes from floor w/ CGA w/o AD using R hand.  Pt amb picking up cones alternating UEs w/o AD, CGA.  Pt performed golf putting w/ verbal cues for positioning L hand on club, often attempting to putt w/o L hand on club, but w/ verbal cues each putt, able to initiate and maintain hold.  Pt performed putting standing on Airex cushion w/o LOB.  Pt performed step-up to Airex cushion w/ contralateral knee then elevated, alternating LEs.  Pt returned to room and remained sitting in recliner w/ chair alarm on and all needs in reach.  Second session: Pt presents sitting EOB w/ son present and agreeable to therapy.  Pt wheeled outside for amb and balance activities on uneven ground.  Pt amb w/ SPC up to 200' including turns for practice w/ safe turns and cane management.  Son educated on process.  Pt performed amb w/o SPC and CGA including turns.  Pt amb holding cane in B hands, sidestepping, backwards stepping and sit to stand for improved balance.  Pt required CGA for car transfer as well as verbal cues for safe approach and entry, as pt attempted to step in. Pt negotiated 12 steps w/ R  rail and holding SPC, 4 steps w/ son guarding at [facility].  Pt negotiated w/ reciprocal gait pattern.  Pt negotiated curb height step w/ SPC and CGA.  Pt returned to room and remained sitting in recliner w/ legs elevated, chair alarm on and all needs in reach.     Therapy Documentation Precautions:  Precautions Precautions: Fall, Other (comment) Precaution Comments: Loop recorder; left neglect versus visual field cut Restrictions Weight Bearing Restrictions: No General:   Vital Signs:  Pain:0/10 Pain Assessment Pain Scale: 0-10 Pain Score: 0-No pain Mobility:       Therapy/Group: Individual Therapy  Lucio Edward 08/20/2021, 11:02 AM

## 2021-08-20 NOTE — Progress Notes (Signed)
Occupational Therapy Session Note  Patient Details  Name: Alyssa Mayo MRN: [id number] Date of Birth: Jul 03, 1946  Today's Date: 08/20/2021 OT Individual Time: 1100-1200 OT Individual Time Calculation (min): 60 min    Short Term Goals: Week 2:  OT Short Term Goal 1 (Week 2): STGs=LTGs due to ELOS  Skilled Therapeutic Interventions/Progress Updates:    Pt sitting up in recliner, requesting to take a shower.  Pt completed sit to stand with supervision and ambulated using [facility] with close supervision to bathroom and completed shower bench stand to sit transfer.  Pt doffed shirt with supervision and doffed brief and pants with CGA in standing.  Pt completed UB/LB bathing only one verbal cue for sequencing.  Pt dried off, then donned shirt self initiating hemi technique and only needing verbal cue to attend to left side to pull down all the way.  Pt donned pullup and pants with min assist to pull up over left side of hip due to impaired sensation, proprioception, and motor control LUE.  Pt ambulated to sink and brushed teeth with setup.  Pt returned to recliner without AD with CGA. Call bell in reach, seat alarm on at end of session.  Therapy Documentation Precautions:  Precautions Precautions: Fall, Other (comment) Precaution Comments: Loop recorder; left neglect versus visual field cut Restrictions Weight Bearing Restrictions: No    Therapy/Group: Individual Therapy  Amie Critchley 08/20/2021, 2:14 PM

## 2021-08-20 NOTE — Progress Notes (Signed)
Speech Language Pathology Daily Session Note  Patient Details  Name: Alyssa Mayo MRN: [id number] Date of Birth: [birth year]  Today's Date: 08/20/2021 SLP Individual Time: 0816-0900 SLP Individual Time Calculation (min): 44 min  Short Term Goals: Week 2: SLP Short Term Goal 1 (Week 2): continue STG=LTG due to longer than expected length of stay  Skilled Therapeutic Interventions: Skilled ST services focused on cognitive skills. SLP facilitated mildly complex problem solving, error awareness and recall within task during calendar task completed on phone and on paper. Pt supports using calendar on smartphone for time management, however due to limited left mobility and reduced error awareness pt required max A. SLP adjusted tasks to paper calendar and encouraged pt to utilizing "thinking aloud" strategy to aid in problem solving, recall and error awareness, pt required ,mod A fade to min A verbal cues to use strategy. Pt demonstrated greater problem solving and recall with use of strategy, however error awareness and ability to correct errors continued to require max-mod A verbal cues. SLP provided education to allow pt's son to assist with IADLs, pt agreed. Pt was left in room with bed alarm set and call bell within reach. Recommend to continue skilled ST services.      Pain Pain Assessment Pain Scale: 0-10 Pain Score: 0-No pain  Therapy/Group: Individual Therapy  Sarafina Puthoff  [facility] 08/20/2021, 10:49 AM

## 2021-08-20 NOTE — Progress Notes (Addendum)
Occupational Therapy Session Note  Patient Details  Name: Alyssa Mayo MRN: [id number] Date of Birth: Dec 02, 1945  Today's Date: 08/15/2021 OT Individual Time: 1100-1200 OT Individual Time Calculation (min): 60 min    Short Term Goals: Week 2:  OT Short Term Goal 1 (Week 2): STGs=LTGs due to ELOS  Skilled Therapeutic Interventions/Progress Updates:    Pt sitting up in recliner, requesting to take a shower.  Pt completed sit to stand with supervision and ambulated using SPC with CGA to bathroom.  Pt initiated toilet transfer and stated she needed to use the bathroom.  Pt had continent episode of urine.  Toileting completed with min assist overall.  Pt ambulated to shower and completed shower bench stand to sit transfer with CGA.  Pt doffed shirt with min assist and doffed brief and pants with min assist as well at sit<>stand level.  Pt completed UB/LB bathing with min intermittent cues to attend to left side of body and for sequencing.  Pt dried off, then donned shirt without implementing hemi techniques and needed min assist to correct due to left arm not threaded and pt unaware that half of shirt remained un donned.  Pt donned pullup and pants with min assist to pull up over left side of hip due to impaired sensation, proprioception, and motor control LUE.  Pt ambulated to recliner with CGA and cues to attend to left environment to avoid bumping into left doorframe.  Pt dried hair using blowdryer needing mod assist and cues at one instance to attend to position of dryer due to pt distracted externally and letting dryer sit on LUE with heat blowing on arm, posing possible safety hazard. Call bell in reach, seat alarm on at end of session.  Therapy Documentation Precautions:  Precautions Precautions: Fall, Other (comment) Precaution Comments: Loop recorder; left neglect versus visual field cut Restrictions Weight Bearing Restrictions: No    Therapy/Group: Individual Therapy  Amie Critchley 08/20/2021, 12:56 PM

## 2021-08-20 NOTE — Plan of Care (Signed)
The following OT goals have been downgraded due to slower progress made than expected. Problem: RH Grooming Goal: LTG Patient will perform grooming w/assist,cues/equip (OT) Description: LTG: Patient will perform grooming with assist, with/without cues using equipment (OT) Flowsheets (Taken 08/20/2021 1411) LTG: Pt will perform grooming with assistance level of: Supervision/Verbal cueing   Problem: RH Bathing Goal: LTG Patient will bathe all body parts with assist levels (OT) Description: LTG: Patient will bathe all body parts with assist levels (OT) Flowsheets (Taken 08/20/2021 1411) LTG: Pt will perform bathing with assistance level/cueing: Supervision/Verbal cueing LTG: Position pt will perform bathing: Shower   Problem: RH Dressing Goal: LTG Patient will perform upper body dressing (OT) Description: LTG Patient will perform upper body dressing with assist, with/without cues (OT). Flowsheets (Taken 08/20/2021 1411) LTG: Pt will perform upper body dressing with assistance level of: Supervision/Verbal cueing Goal: LTG Patient will perform lower body dressing w/assist (OT) Description: LTG: Patient will perform lower body dressing with assist, with/without cues in positioning using equipment (OT) Flowsheets (Taken 08/20/2021 1411) LTG: Pt will perform lower body dressing with assistance level of: Minimal Assistance - Patient > 75%   Problem: RH Toileting Goal: LTG Patient will perform toileting task (3/3 steps) with assistance level (OT) Description: LTG: Patient will perform toileting task (3/3 steps) with assistance level (OT)  Flowsheets (Taken 08/20/2021 1411) LTG: Pt will perform toileting task (3/3 steps) with assistance level: Minimal Assistance - Patient > 75%   Problem: RH Toilet Transfers Goal: LTG Patient will perform toilet transfers w/assist (OT) Description: LTG: Patient will perform toilet transfers with assist, with/without cues using equipment (OT) Flowsheets (Taken  08/20/2021 1411) LTG: Pt will perform toilet transfers with assistance level of: Supervision/Verbal cueing   Problem: RH Tub/Shower Transfers Goal: LTG Patient will perform tub/shower transfers w/assist (OT) Description: LTG: Patient will perform tub/shower transfers with assist, with/without cues using equipment (OT) Flowsheets (Taken 08/20/2021 1411) LTG: Pt will perform tub/shower stall transfers with assistance level of: Supervision/Verbal cueing

## 2021-08-20 NOTE — Progress Notes (Signed)
PROGRESS NOTE   Subjective/Complaints: No new complaints today She asks if amlodipine can be stopped since it is a very low dose and she had some dizziness from low blood pressure. Discussed that we can stop it  ROS: Patient denies fever, rash, sore throat, blurred vision, nausea, vomiting, diarrhea, cough, shortness of breath or chest pain, joint or back/neck pain, headache, or mood change. +dizziness from low blood pressure  Objective:   No results found. No results for input(s): WBC, HGB, HCT, PLT in the last 72 hours.  No results for input(s): NA, K, CL, CO2, GLUCOSE, BUN, CREATININE, CALCIUM in the last 72 hours.   Intake/Output Summary (Last 24 hours) at 08/20/2021 1101 Last data filed at 08/20/2021 0258 Gross per 24 hour  Intake 594 ml  Output --  Net 594 ml        Physical Exam: Vital Signs Blood pressure (!) 142/49, pulse 78, temperature 97.8 F (36.6 C), temperature source Oral, resp. rate 14, height 5\' 3"  (1.6 m), weight 93.6 kg, SpO2 91 %. Gen: no distress, normal appearing HEENT: oral mucosa pink and moist, NCAT Cardio: Reg rate Chest: normal effort, normal rate of breathing Abd: soft, non-distended Ext: no edema   Psych: pleasant and cooperative, a little flat Skin: loop recorder site well healed. Skin intact otherwise  Neurological/MSK     Comments: Patient is alert.  Makes eye contact with examiner.  No acute distress.  Follows commands.  Provides name age and date of birth.  Fair insight and awareness.  Strength intact except for LUE which has 3/5 hand grasp. Sensation intact. LUE tenderness. Left sided neglect ongoing     Assessment/Plan: 1. Functional deficits which require 3+ hours per day of interdisciplinary therapy in a comprehensive inpatient rehab setting. Physiatrist is providing close team supervision and 24 hour management of active medical problems listed below. Physiatrist and rehab  team continue to assess barriers to discharge/monitor patient progress toward functional and medical goals  Care Tool:  Bathing              Bathing assist Assist Level: Minimal Assistance - Patient > 75%     Upper Body Dressing/Undressing Upper body dressing   What is the patient wearing?: Pull over shirt    Upper body assist Assist Level: Minimal Assistance - Patient > 75%    Lower Body Dressing/Undressing Lower body dressing      What is the patient wearing?: Pants     Lower body assist Assist for lower body dressing: Minimal Assistance - Patient > 75%     Toileting Toileting    Toileting assist Assist for toileting: Minimal Assistance - Patient > 75%     Transfers Chair/bed transfer  Transfers assist     Chair/bed transfer assist level: Supervision/Verbal cueing Chair/bed transfer assistive device:   Ambulation assist      Assist level: Contact Guard/Touching assist Assistive device: Cane-straight Max distance: 150   Walk 10 feet activity   Assist     Assist level: Contact Guard/Touching assist Assistive device: Cane-straight   Walk 50 feet activity   Assist    Assist level: Contact Guard/Touching assist Assistive  device: Cane-straight    Walk 150 feet activity   Assist Walk 150 feet activity did not occur: Safety/medical concerns  Assist level: Contact Guard/Touching assist Assistive device: Cane-straight    Walk 10 feet on uneven surface  activity   Assist Walk 10 feet on uneven surfaces activity did not occur: Safety/medical concerns         Wheelchair     Assist Is the patient using a wheelchair?: No   Wheelchair activity did not occur: N/A         Wheelchair 50 feet with 2 turns activity    Assist    Wheelchair 50 feet with 2 turns activity did not occur: N/A       Wheelchair 150 feet activity     Assist  Wheelchair 150 feet activity did not occur:  N/A       Blood pressure (!) 142/49, pulse 78, temperature 97.8 F (36.6 C), temperature source Oral, resp. rate 14, height 5\' 3"  (1.6 m), weight 93.6 kg, SpO2 91 %.    dical Problem List and Plan: 1. 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  Status post loop recorder             -may not shower until loop recorder incision has heals             -ELOS/Goals: 5-7 days S  Continue CIR therapies including PT, OT, and SLP   Start B/C vitamin complex to aid stroke recovery.   HFU scheduled for February.  2.  Antithrombotics: -DVT/anticoagulation:  Mechanical: Antiembolism stockings, thigh (TED hose) Bilateral lower extremities             -antiplatelet therapy: Aspirin 81 mg daily 3. LUE pain: Tylenol needed. Continue kpad 4. Mood: Provide emotional support             -antipsychotic agents: N/A 5. Neuropsych: This patient is capable of making decisions on her own behalf. 6. Skin/Wound Care: Routine skin checks 7. Fluids/Electrolytes/Nutrition: Routine in and outs with follow-up chemistries 8.  Hypertension.  d/c norvasc given dizziness with diastolic to 49. Monitor with increased mobility. Increase magnesium gluconate to 500mg  HS   1/28 bp under reasonable control 9.  Hyperlipidemia.  Lipitor  10.  Diabetes mellitus.  Hemoglobin A1c 7.4.  Presently on SSI.  Patient on Glucotrol XL 5 mg twice daily, Glucophage 1000 mg twice daily, Farxiga 10 mg daily prior to admission.  Increase Metformin to 850mg . Discussed that she will not need insulin at home. Discussed improvements in Cbgs with her.   CBG (last 3)  Recent Labs    08/19/21 1713 08/19/21 2108 08/20/21 0535  GLUCAP 139* 182* 159*   11.  Obesity.  BMI 36.49-->36.55.  Dietary follow-up 12. AKI: Resolved. Pushing fluids 13. Hypoalbuminemia: discussed high blood sugars are likely binding to proteins in her blood stream, advised lo sugar/high protein diet.  14. Hypokalemia: supplement 1/21  and 1/25, 4.0  15. Suboptimal Vitamin D: start ergocalciferol 50,000U once per week for 7 weeks.  16. GERD: wean Protonix to 20mg  since no apparent indication. Will maintain this dose since it does help with her GERD. Discussed with her lowering the dose.   17. Constipation: continue magnesium gluconate 250mg HS  -BM 1/28 18. Slow processing: continue SLP 19. Cognitive deficits: continue SLP 20. Tachycardia: EKG obtained and shows normal sinus rhythm. Resolved. Continue to monitor TID 21. Pneumonia: CXR obtained and stable. Discussed results with her.  22. Cough: asked nursing to bring  her tea with honey.  LOS: 13 days A FACE TO FACE EVALUATION WAS PERFORMED  Timi Reeser P Nyimah Shadduck 08/20/2021, 11:01 AM

## 2021-08-20 NOTE — Progress Notes (Signed)
Speech Language Pathology Discharge Summary  Patient Details  Name: Alyssa Mayo MRN: [id number] Date of Birth: Jan 04, 1946  Today's Date: 08/21/2021 SLP Individual Time: 1300-1330 SLP Individual Time Calculation (min): 30 min  Skilled Therapeutic Interventions: Skilled ST treatment focused on cognitive goals and family education. SLP educated son on cognitive-communication deficits, strategies to aid problem solving and functional recall, and recommendations for assist with iADLs at discharge. All questions were addressed addressed. SLP re-administered the [facility] Mental Status Examination (SLUMS) and patient scored 24/30 points with a score of 27 or above considered normal based on pt's educational history. Patient exhibited decreased problem solving, calculations, and information processing. Informally, pt continues to exhibit decreased emergent awareness. Educated pt and son of results as well as recommendations for follow up SLP services to maximize functional independence. Patient was left in bed with alarm activated and immediate needs within reach at end of session.   Patient has met 4 of 4 long term goals.  Patient to discharge at overall Supervision;Min level.  Reasons goals not met:     Clinical Impression/Discharge Summary:   Pt made good progress meeting 4 out 4 goals, discharging at supervision A for expressive thoughts, min A for problem solving and recall with min-mod A for emergent awareness. Pt demonstrated improvements in carryover of strategies to aid in problem solving, recall and error awareness such as "thinking aloud" during functional IADLs tasks (medication/money/time management.) Barriers to d/c continue to be primary deficit in emergent awareness along with left inattention, mildly complex problem solving, recall, and flat affect. Education has been coming with pt's son and all questions answered to satisfaction. Pt benefited from skilled ST services in  order to maximize functional independence and reduce burden of care, requiring 24 hour supervision at discharge with continued skilled ST services.  Care Partner:  Caregiver Able to Provide Assistance: Yes  Type of Caregiver Assistance: Physical;Cognitive  Recommendation:  24 hour supervision/assistance;Home Health SLP;Outpatient SLP  Rationale for SLP Follow Up: Maximize functional communication;Maximize cognitive function and independence;Reduce caregiver burden   Equipment: N/A   Reasons for discharge: Discharged from hospital   Patient/Family Agrees with Progress Made and Goals Achieved: Yes    [place]  [facility] 08/20/2021, 11:41 AM

## 2021-08-20 NOTE — Discharge Summary (Signed)
Physician Discharge Summary  Patient ID: Alyssa Mayo MRN: [id number] DOB/AGE: September 06, 1945 76 y.o.  Admit date: 08/07/2021 Discharge date: 08/22/2021  Discharge Diagnoses:  Principal Problem:   CVA (cerebral vascular accident) [facility]) Active Problems:   Right middle cerebral artery stroke ([place]) Hypertension Hyperlipidemia Diabetes mellitus Obesity AKI-resolved Macular degeneration  Discharged Condition: Stable  Significant Diagnostic Studies: CT ANGIO HEAD NECK W WO CM  Result Date: 08/01/2021 CLINICAL DATA:  Follow-up large right MCA stroke with hemorrhagic conversion EXAM: CT ANGIOGRAPHY HEAD AND NECK TECHNIQUE: Multidetector CT imaging of the head and neck was performed using the standard protocol during bolus administration of intravenous contrast. Multiplanar CT image reconstructions and MIPs were obtained to evaluate the vascular anatomy. Carotid stenosis measurements (when applicable) are obtained utilizing NASCET criteria, using the distal internal carotid diameter as the denominator. RADIATION DOSE REDUCTION: This exam was performed according to the departmental dose-optimization program which includes automated exposure control, adjustment of the mA and/or kV according to patient size and/or use of iterative reconstruction technique. CONTRAST:  67mL OMNIPAQUE IOHEXOL 350 MG/ML SOLN COMPARISON:  08/01/2021 CT head and MRI head FINDINGS: CT HEAD FINDINGS For noncontrast findings, please see same day CT head. CTA NECK FINDINGS Aortic arch: Two-vessel arch with a common origin of the brachiocephalic and left common carotid arteries. Imaged portion shows no evidence of aneurysm or dissection. No significant stenosis of the major arch vessel origins. Aortic atherosclerosis. Right carotid system: No evidence of dissection, stenosis (50% or greater) or occlusion. Retropharyngeal course of the common and internal carotid arteries. Left carotid system: No evidence of dissection, stenosis (50%  or greater) or occlusion. Retropharyngeal course of the common and internal carotid arteries. Vertebral arteries: No evidence of dissection, stenosis (50% or greater) or occlusion. Skeleton: Degenerative changes with straightening and mild reversal of the normal cervical lordosis, with degenerative osseous fusion of C4-C6. No acute osseous abnormality. Other neck: Negative. Upper chest: No focal pulmonary opacity or pleural effusion. Review of the MIP images confirms the above findings CTA HEAD FINDINGS Evaluation is somewhat limited by degree of venous contamination. Anterior circulation: Both internal carotid arteries are patent to the termini, without stenosis or other abnormality. A1 segments patent. Normal anterior communicating artery. Anterior cerebral arteries are patent to their distal aspects. No M1 stenosis or occlusion. At the right MCA bifurcation, there is a nonocclusive filling defect (series 9, image 92-93), with irregular and diminished flow in the inferior division and its branches. Patent superior division. Normal left MCA bifurcation. Distal left MCA branches perfused. Posterior circulation: Vertebral arteries patent to the vertebrobasilar junction without stenosis. Basilar patent to its distal aspect. Small fenestration proximally. Superior cerebellar arteries patent bilaterally. Bilateral P1 segments patent. Bilateral posterior communicating arteries patent. PCAs perfused to their distal aspects without stenosis. Venous sinuses: As permitted by contrast timing, patent. Anatomic variants: None significant Review of the MIP images confirms the above findings IMPRESSION: 1. Nonocclusive filling defect in the right MCA bifurcation, with irregular and diminished flow in the inferior M2. 2. No other significant stenosis or large vessel occlusion. 3.  No hemodynamically significant stenosis in the neck. Electronically Signed   By: Merilyn Baba M.D.   On: 08/01/2021 19:40   DG Chest 2 View  Result  Date: 08/15/2021 CLINICAL DATA:  Cough. EXAM: CHEST - 2 VIEW COMPARISON:  August 02, 2021. FINDINGS: Stable cardiomegaly. Both lungs are clear. The visualized skeletal structures are unremarkable. IMPRESSION: No active cardiopulmonary disease. Electronically Signed   By: Bobbe Medico.D.  On: 08/15/2021 15:30   DG Chest 2 View  Result Date: 07/23/2021 CLINICAL DATA:  Short of breath, cough for 5-6 days EXAM: CHEST - 2 VIEW COMPARISON:  None. FINDINGS: Frontal and lateral views of the chest demonstrate an enlarged cardiac silhouette. There is patchy consolidation within the right infrahilar region concerning for pneumonia. No effusion or pneumothorax. No acute bony abnormalities. IMPRESSION: 1. Right infrahilar airspace disease, likely in the right middle lobe, compatible with pneumonia. 2. Enlarged cardiac silhouette. Electronically Signed   By: Randa Ngo M.D.   On: 07/23/2021 17:47   CT HEAD WO CONTRAST (5MM)  Result Date: 08/03/2021 CLINICAL DATA:  Stroke follow-up EXAM: CT HEAD WITHOUT CONTRAST TECHNIQUE: Contiguous axial images were obtained from the base of the skull through the vertex without intravenous contrast. RADIATION DOSE REDUCTION: This exam was performed according to the departmental dose-optimization program which includes automated exposure control, adjustment of the mA and/or kV according to patient size and/or use of iterative reconstruction technique. COMPARISON:  Yesterday FINDINGS: Brain: Right MCA territory infarction which is most confluent at the insula and operculum with petechial hemorrhage. No evidence of progression. No worrisome mass effect, collection, or hydrocephalus. Vascular: No hyperdense vessel or unexpected calcification. Skull: Normal. Negative for fracture or focal lesion. Sinuses/Orbits: Bilateral cataract resection IMPRESSION: No interval progression of the recent right MCA infarct with petechial hemorrhage. Electronically Signed   By: Jorje Guild M.D.    On: 08/03/2021 04:10   CT HEAD WO CONTRAST  Result Date: 08/02/2021 CLINICAL DATA:  Stroke follow-up EXAM: CT HEAD WITHOUT CONTRAST TECHNIQUE: Contiguous axial images were obtained from the base of the skull through the vertex without intravenous contrast. RADIATION DOSE REDUCTION: This exam was performed according to the departmental dose-optimization program which includes automated exposure control, adjustment of the mA and/or kV according to patient size and/or use of iterative reconstruction technique. COMPARISON:  Brain MRI from yesterday FINDINGS: Brain: Cytotoxic edema from recent infarct in the right MCA territory, unchanged in extent when compared to prior brain MRI, specifically involving the lateral frontal lobe, insula, and patchy along the high frontal parietal convexity. Basal ganglia involvement at the putamen and caudate. Petechial hemorrhage centered on the frontal operculum and insula. Local mass effect is unchanged. No progressive finding or hydrocephalus. Vascular: No hyperdense vessel or unexpected calcification. Skull: Normal. Negative for fracture or focal lesion. Sinuses/Orbits: No acute finding. IMPRESSION: Unchanged right MCA territory infarct with petechial hemorrhage. Electronically Signed   By: Jorje Guild M.D.   On: 08/02/2021 04:22   CT HEAD WO CONTRAST  Result Date: 08/01/2021 CLINICAL DATA:  Neuro deficit, stroke suspected, left-sided weakness and slurred speech EXAM: CT HEAD WITHOUT CONTRAST TECHNIQUE: Contiguous axial images were obtained from the base of the skull through the vertex without intravenous contrast. RADIATION DOSE REDUCTION: This exam was performed according to the departmental dose-optimization program which includes automated exposure control, adjustment of the mA and/or kV according to patient size and/or use of iterative reconstruction technique. COMPARISON:  06/26/2014 head CT, correlation is also made with 05/21/2019 MRI head FINDINGS: Brain: Mass  or masses in the inferior right frontal lobe, measuring approximately 3.7 x 1.7 x 3.3 cm (series 3, image 13 and series 5, image 27), with surrounding hypodensity, most likely edema, which extends into the right basal ganglia and causes sulcal crowding in the right frontal lobe and mild narrowing of the right lateral ventricle. Approximately 3 mm of right-to-left midline shift at the level of the basal ganglia. Additional hypodensity more  superiorly in the anterior right frontal lobe (series 3, image 22) and in the right frontoparietal region (series 3, image 22), new from the prior exam may be related to the mass or represent age-indeterminate infarcts. No acute hemorrhage.  No hydrocephalus or extra-axial collection. Vascular: No hyperdense vessel. Skull: No acute osseous abnormality. Sinuses/Orbits: Mucosal thickening in the ethmoid air cells. Status post right lens replacement. Other: Trace fluid in the bilateral mastoid air cells. IMPRESSION: 1. Mass in the inferior right frontal lobe, with surrounding edema, with local mass effect in the right frontal lobe and on the right lateral ventricle, with approximately 3 mm of right-to-left midline shift. An MRI with and without contrast is recommended for further evaluation. 2. Additional hypodensity in the anterior right frontal lobe and right frontal parietal region are new from the prior exam and are technically age indeterminate but may indicate subacute infarcts. These results were called by telephone at the time of interpretation on 08/01/2021 at 11:20 am to provider Merlyn Lot , who verbally acknowledged these results. Electronically Signed   By: Merilyn Baba M.D.   On: 08/01/2021 11:20   MR Brain W and Wo Contrast  Result Date: 08/01/2021 CLINICAL DATA:  Left-sided weakness, slurred speech EXAM: MRI HEAD WITHOUT AND WITH CONTRAST TECHNIQUE: Multiplanar, multiecho pulse sequences of the brain and surrounding structures were obtained without and with  intravenous contrast. CONTRAST:  90mL GADAVIST GADOBUTROL 1 MMOL/ML IV SOLN COMPARISON:  Same-day noncontrast head CT FINDINGS: Brain: There is extensive diffusion restriction throughout much of the MCA territory including the insula, anterior frontal lobe, lentiform nucleus, caudate body, extending into the high right frontal and parietal lobe cortices. There is associated FLAIR signal abnormality and edema consistent with subacute infarcts. There is extensive SWI signal dropout consistent with hemorrhagic transformation. There is no enhancement to suggest solid underlying mass lesion. The above findings result in effacement of the sulci particularly in the region of the right frontal operculum and partial effacement of the right lateral ventricle. There is no midline shift. The remainder of the parenchyma is normal in appearance. There is no abnormal enhancement. Vascular: Normal flow voids. Skull and upper cervical spine: Normal marrow signal. Sinuses/Orbits: There is mild mucosal thickening in the paranasal sinuses. Bilateral lens implants are in place. The globes and orbits are otherwise unremarkable. Other: There are small bilateral mastoid effusions. IMPRESSION: 1. Findings above consistent with early subacute infarcts in the right MCA distribution with involvement of the insula, lentiform and caudate nuclei, frontal, and parietal lobes with evidence of hemorrhagic transformation. Associated edema results in mild regional mass effect without midline shift. 2. No abnormal enhancement to suggest underlying mass lesion. Critical Value/emergent results were called by telephone at the time of interpretation on 08/01/2021 at 2:00 pm to provider Dr Tamala Julian, who verbally acknowledged these results. Electronically Signed   By: Valetta Mole M.D.   On: 08/01/2021 14:04   EP PPM/ICD IMPLANT  Result Date: 08/05/2021 CONCLUSIONS:  1. Successful implantation of a [employer] Reveal LINQ implantable loop recorder for  cryptogenic stroke  2. No early apparent complications. Cristopher Peru, MD 08/05/2021   DG Chest Port 1 View  Result Date: 08/02/2021 CLINICAL DATA:  Leukocytosis EXAM: PORTABLE CHEST 1 VIEW COMPARISON:  08/01/2021 FINDINGS: Stable cardiomegaly. Slightly low lung volumes. Streaky bibasilar opacities. No large pleural fluid collection. No pneumothorax. IMPRESSION: Low lung volumes with streaky bibasilar opacities, likely atelectasis. Electronically Signed   By: Davina Poke D.O.   On: 08/02/2021 08:49   DG Chest  Port 1 View  Result Date: 08/01/2021 CLINICAL DATA:  Best obtainable images. Unable to locate bra, patient would not stop rolling onto left side. Stroke, cerebrum, left sided weakness and slurred speech. Per son, symptoms started Tuesday. EXAM: PORTABLE CHEST 1 VIEW COMPARISON:  07/23/2021 FINDINGS: Stable large cardiac silhouette. Low lung volumes. LEFT base poorly evaluated. No pulmonary edema. No pneumothorax. IMPRESSION: Cardiomegaly and LEFT basilar atelectasis. Electronically Signed   By: Genevive Bi M.D.   On: 08/01/2021 17:02   ECHOCARDIOGRAM COMPLETE  Result Date: 08/02/2021    ECHOCARDIOGRAM REPORT   Patient Name:   Alyssa Mayo Date of Exam: 08/02/2021 Medical Rec #:  [id number]          Height:       63.0 in Accession #:    [id number]         Weight:       206.0 lb Date of Birth:  Jan 19, 1946           BSA:          1.958 m Patient Age:    75 years           BP:           131/51 mmHg Patient Gender: F                  HR:           96 bpm. Exam Location:  Inpatient Procedure: 2D Echo Indications:     Stroke  History:         Patient has no prior history of Echocardiogram examinations.                  Risk Factors:Hypertension and Diabetes.  Sonographer:     Eduard Roux Referring Phys:  [id number] Diagnosing Phys: Laurance Flatten MD  Sonographer Comments: Technically difficult study due to poor echo windows. Image acquisition challenging due to patient body  habitus and Image acquisition challenging due to uncooperative patient. Contrast was not used due to patients poor temperment and her request to end the exam. IMPRESSIONS  1. Technically difficult study with poor echo windows. Patient declined Definity contrast.  2. Left ventricular ejection fraction, by estimation, is 55 to 60%. The left ventricle has normal function. Left ventricular endocardial border not optimally defined to evaluate regional wall motion. Left ventricular diastolic parameters are consistent with Grade I diastolic dysfunction (impaired relaxation).  3. Right ventricular systolic function is normal. The right ventricular size is normal. The RV wall appears thickened, however, this may be due to a prominent fat pad.  4. The mitral valve is normal in structure. Trivial mitral valve regurgitation.  5. The aortic valve is tricuspid. There is mild calcification of the aortic valve. There is mild thickening of the aortic valve. Aortic valve regurgitation is not visualized. Aortic valve sclerosis/calcification is present, without any evidence of aortic stenosis.  6. The inferior vena cava is normal in size with greater than 50% respiratory variability, suggesting right atrial pressure of 3 mmHg. Conclusion(s)/Recommendation(s): No intracardiac source of embolism detected on this transthoracic study. Consider a transesophageal echocardiogram to exclude cardiac source of embolism if clinically indicated. FINDINGS  Left Ventricle: Left ventricular ejection fraction, by estimation, is 55 to 60%. The left ventricle has normal function. Left ventricular endocardial border not optimally defined to evaluate regional wall motion. The left ventricular internal cavity size was normal in size. There is no left ventricular hypertrophy. Left ventricular diastolic parameters are consistent  with Grade I diastolic dysfunction (impaired relaxation). Right Ventricle: The right ventricular size is normal. Right vetricular  wall thickness was not well visualized. Right ventricular systolic function is normal. Left Atrium: Left atrial size was not well visualized. Right Atrium: Right atrial size was not well visualized. Pericardium: Trivial pericardial effusion is present. Presence of epicardial fat layer. Mitral Valve: The mitral valve is normal in structure. Trivial mitral valve regurgitation. Tricuspid Valve: The tricuspid valve is normal in structure. Tricuspid valve regurgitation is trivial. Aortic Valve: The aortic valve is tricuspid. There is mild calcification of the aortic valve. There is mild thickening of the aortic valve. Aortic valve regurgitation is not visualized. Aortic valve sclerosis/calcification is present, without any evidence of aortic stenosis. Aortic valve peak gradient measures 23.4 mmHg. Pulmonic Valve: The pulmonic valve was normal in structure. Pulmonic valve regurgitation is trivial. Aorta: The aortic root and ascending aorta are structurally normal, with no evidence of dilitation. Venous: The inferior vena cava is normal in size with greater than 50% respiratory variability, suggesting right atrial pressure of 3 mmHg. IAS/Shunts: The atrial septum is grossly normal.  LEFT VENTRICLE PLAX 2D LVIDd:         5.10 cm   Diastology LVIDs:         4.10 cm   LV e' medial:    6.16 cm/s LV PW:         1.10 cm   LV E/e' medial:  16.9 LV IVS:        1.00 cm   LV e' lateral:   7.87 cm/s LVOT diam:     2.00 cm   LV E/e' lateral: 13.2 LV SV:         100 LV SV Index:   51 LVOT Area:     3.14 cm  IVC IVC diam: 1.70 cm LEFT ATRIUM           Index        RIGHT ATRIUM           Index LA Vol (A2C): 63.7 ml 32.53 ml/m  RA Area:     11.70 cm LA Vol (A4C): 38.6 ml 19.71 ml/m  RA Volume:   19.90 ml  10.16 ml/m  AORTIC VALVE                  PULMONIC VALVE AV Area (Vmax): 2.03 cm      PV Vmax:       0.89 m/s AV Vmax:        242.00 cm/s   PV Peak grad:  3.1 mmHg AV Peak Grad:   23.4 mmHg LVOT Vmax:      156.00 cm/s LVOT Vmean:      107.000 cm/s LVOT VTI:       0.319 m  AORTA Ao Asc diam: 3.10 cm MITRAL VALVE MV Area (PHT): 3.79 cm     SHUNTS MV Decel Time: 200 msec     Systemic VTI:  0.32 m MV E velocity: 104.00 cm/s  Systemic Diam: 2.00 cm MV A velocity: 111.00 cm/s MV E/A ratio:  0.94 Gwyndolyn Kaufman MD Electronically signed by Gwyndolyn Kaufman MD Signature Date/Time: 08/02/2021/1:38:41 PM    Final (Updated)    VAS [place] LOWER EXTREMITY VENOUS (DVT)  Result Date: 08/02/2021  Lower Venous DVT Study Patient Name:  Alyssa Mayo  Date of Exam:   08/02/2021 Medical Rec #: [id number]           Accession #:    [id number] Date  of Birth:                     Patient Gender: Patient Age: Exam Location:  [facility] Procedure:      VAS [place] LOWER EXTREMITY VENOUS (DVT) Referring Phys: --------------------------------------------------------------------------------  Indications: Stroke.  Comparison Study: no prior Performing Technologist: Archie Patten RVS  Examination Guidelines: A complete evaluation includes B-mode imaging, spectral Doppler, color Doppler, and power Doppler as needed of all accessible portions of each vessel. Bilateral testing is considered an integral part of a complete examination. Limited examinations for reoccurring indications may be performed as noted. The reflux portion of the exam is performed with the patient in reverse Trendelenburg.  +---------+---------------+---------+-----------+----------+--------------+  RIGHT     Compressibility Phasicity Spontaneity Properties Thrombus Aging  +---------+---------------+---------+-----------+----------+--------------+  CFV       Full            Yes       Yes                                    +---------+---------------+---------+-----------+----------+--------------+  SFJ       Full                                                             +---------+---------------+---------+-----------+----------+--------------+  FV Prox   Full                                                              +---------+---------------+---------+-----------+----------+--------------+  FV Mid    Full                                                             +---------+---------------+---------+-----------+----------+--------------+  FV Distal Full                                                             +---------+---------------+---------+-----------+----------+--------------+  PFV       Full                                                             +---------+---------------+---------+-----------+----------+--------------+  POP       Full            Yes       Yes                                    +---------+---------------+---------+-----------+----------+--------------+  PTV       Full                                                             +---------+---------------+---------+-----------+----------+--------------+  PERO      Full                                                             +---------+---------------+---------+-----------+----------+--------------+   +---------+---------------+---------+-----------+----------+--------------+  LEFT      Compressibility Phasicity Spontaneity Properties Thrombus Aging  +---------+---------------+---------+-----------+----------+--------------+  CFV       Full            Yes       Yes                                    +---------+---------------+---------+-----------+----------+--------------+  SFJ       Full                                                             +---------+---------------+---------+-----------+----------+--------------+  FV Prox   Full                                                             +---------+---------------+---------+-----------+----------+--------------+  FV Mid    Full                                                             +---------+---------------+---------+-----------+----------+--------------+  FV Distal Full                                                              +---------+---------------+---------+-----------+----------+--------------+  PFV       Full                                                             +---------+---------------+---------+-----------+----------+--------------+  POP       Full            Yes       Yes                                    +---------+---------------+---------+-----------+----------+--------------+  PTV       Full                                                             +---------+---------------+---------+-----------+----------+--------------+  PERO      Full                                                             +---------+---------------+---------+-----------+----------+--------------+     Summary: BILATERAL: - No evidence of deep vein thrombosis seen in the lower extremities, bilaterally. -No evidence of popliteal cyst, bilaterally.   *See table(s) above for measurements and observations. Electronically signed by Jamelle Haring on 08/02/2021 at 2:55:16 PM.    Final     Labs:  Basic Metabolic Panel: Recent Labs  Lab 08/15/21 0632  NA 141  K 4.0  CL 109  CO2 23  GLUCOSE 157*  BUN 20  CREATININE 0.94  CALCIUM 9.2    CBC: No results for input(s): WBC, NEUTROABS, HGB, HCT, MCV, PLT in the last 168 hours.  CBG: Recent Labs  Lab 08/20/21 2126 08/21/21 0625 08/21/21 1117 08/21/21 1634 08/21/21 2110  GLUCAP 201* 149* 156* 139* 192*   Family history.  Mother with breast cancer CAD CVA and diabetes mellitus was colon cancer.  Father with CVA and leukemia.  Brother with diabetes.  Denies any esophageal cancer or rectal cancer  Brief HPI:   Alyssa Mayo is a 76 y.o. right-handed female with history of diabetes mellitus hypertension, hyperlipidemia, obesity with BMI 36, macular degeneration.  Per chart review lives with her children.  Independent prior to admission.  Presented 08/01/2021 to [facility] after being found down by her son.  CT/MRI showed findings consistent with early subacute infarct in the right  MCA distribution with involvement of the insula, lentiform caudate, frontal and parietal lobes with evidence of hemorrhagic transformation.  CT angiogram head and neck nonocclusive filling defect in the right MCA bifurcation with irregular diminished flow in the inferior M2.  No other significant stenosis or large vessel occlusion.  Echocardiogram with ejection fraction of 55 to 123456 grade 1 diastolic dysfunction.  Admission chemistries unremarkable except BUN 31 creatinine 1.17 urinalysis negative nitrite hemoglobin A1c 7.4.  Patient maintained on low-dose aspirin for CVA prophylaxis given hemorrhagic transformation.  Latest CT follow-up 08/03/2021 showed no interval progression of recent right MCA infarct with petechial hemorrhage.  Cardiology services consulted for loop recorder placement.  Tolerating a regular consistency diet.  Therapy evaluations completed due to patient decreased functional mobility was admitted for a comprehensive rehab program.   Hospital Course: Alyssa Mayo was admitted to rehab 08/07/2021 for inpatient therapies to consist of PT, ST and OT at least three hours five days a week. Past admission physiatrist, therapy team and rehab RN have worked together to provide customized collaborative inpatient rehab.  Pertaining to patient's right MCA infarct with hemorrhagic transformation status post loop recorder.  She remained on low-dose aspirin therapy would follow-up neurology services.  Blood pressure was a bit soft remained on low-dose Norvasc.  Lipitor ongoing for hyperlipidemia.  Blood sugars monitored hemoglobin A1c 7.4 currently  maintained on Glucophage.  Patient had been on Glucotrol XL as well as [place] prior to admission these could be resumed as needed.  Obesity BMI 36.49 dietary follow-up.  Mild AKI resolved encouragement of fluids.  Bouts of constipation resolved with laxative assistance.   Blood pressures were monitored on TID basis and soft and monitored  Diabetes has  been monitored with ac/hs CBG checks and SSI was use prn for tighter BS control.    Rehab course: During patient's stay in rehab weekly team conferences were held to monitor patient's progress, set goals and discuss barriers to discharge. At admission, patient required minimal assist sit to stand min mod assist 100 feet straight cane.  Physical exam.  Blood pressure 118/53 pulse 75 temperature 98.6 respirations 18 oxygen saturations 96% room air Constitutional.  No acute distress HEENT Head.  Normocephalic and atraumatic Eyes.  Pupils round and reactive to light no discharge without nystagmus Neck.  Supple nontender no JVD without thyromegaly Cardiac regular rate and rhythm any extra sounds or murmur heard Abdomen.  Soft nontender positive bowel sounds without rebound Respiratory effort normal no respiratory distress without wheeze Skin.  Intact Neurologic.  Alert makes eye contact with examiner follows commands.  Provides name age and date of birth.  Fair insight and awareness.  Strength intact except for left upper extremity which was 3/5 hand grasp.  Sensation intact  He/She  has had improvement in activity tolerance, balance, postural control as well as ability to compensate for deficits. He/She has had improvement in functional use RUE/LUE  and RLE/LLE as well as improvement in awareness.  Sit to stand close supervision stand pivot transfer to wheelchair with straight point cane contact-guard.  Ambulating 100 feet outdoors straight point cane contact-guard.  Toilet transfers straight point cane contact-guard.  Patient required some assist for clothing management on left side due to upper extremity weakness.  Supine to sit using bed features supervision.  She was able to wash and dry her upper lower body with minimal assist.  Requires a same level assist for donning her upper lower body clothing.  Full family teaching completed plan discharged to home       Disposition: Discharged  home    Diet: Diabetic diet  Special Instructions: No driving smoking or alcohol  Medications at discharge.\ 1.  Tylenol as needed 2.  Norvasc 2.5 mg p.o. daily 3.  Aspirin 81 mg p.o. daily 4.  Lipitor 20 mg p.o. daily 5.  B complex with vitamin C daily 6.  Magnesium gluconate 500 mg p.o. nightly 7.  Glucophage 1000 mg mg p.o. twice daily 8.  Vitamin D 50,000 units every 7 days 9.  Albuterol inhaler 1 to 2 puffs every 6 hours as needed shortness of breath 10.  Protonix 40 mg p.o. daily 11.  Farxiga 10 mg daily 12.  Glucotrol 5 mg twice daily  30-35 minutes were spent completing discharge summary and discharge planning Discharge Instructions     Ambulatory referral to Neurology   Complete by: As directed    An appointment is requested in approximately: 4 weeks right MCA infarction with hemorrhagic transformation   Ambulatory referral to Physical Medicine Rehab   Complete by: As directed    Moderate complexity follow-up 1 to 2 weeks right MCA infarction        Follow-up Information     Raulkar, Clide Deutscher, MD Follow up.   Specialty: Physical Medicine and Rehabilitation Why: 09/16/21 please arrive at 10:40am for 11:00am appointment, thank you! Contact  information: [address] [place] [address] [phone]         Evans Lance, MD Follow up.   Specialty: Cardiology Why: Call for appointment Contact information: [id number] [place] [address] [phone]                 Signed: Lavon Paganini Mayo 08/22/2021, 5:12 AM

## 2021-08-21 ENCOUNTER — Other Ambulatory Visit (HOSPITAL_COMMUNITY): Payer: Self-pay

## 2021-08-21 LAB — GLUCOSE, CAPILLARY
Glucose-Capillary: 149 mg/dL — ABNORMAL HIGH (ref 70–99)
Glucose-Capillary: 156 mg/dL — ABNORMAL HIGH (ref 70–99)
Glucose-Capillary: 139 mg/dL — ABNORMAL HIGH (ref 70–99)
Glucose-Capillary: 192 mg/dL — ABNORMAL HIGH (ref 70–99)

## 2021-08-21 MED ORDER — AMLODIPINE BESYLATE 2.5 MG PO TABS
2.5000 mg | ORAL_TABLET | Freq: Every day | ORAL | 0 refills | Status: AC
Start: 2021-08-21 — End: ?
  Filled 2021-08-21: qty 30, 30d supply, fill #0

## 2021-08-21 MED ORDER — CHOLECALCIFEROL 25 MCG (1000 UT) PO TABS
1000.0000 [IU] | ORAL_TABLET | Freq: Every morning | ORAL | 0 refills | Status: DC
  Filled 2021-08-21: qty 30, 30d supply, fill #0

## 2021-08-21 MED ORDER — GLIPIZIDE ER 5 MG PO TB24
5.0000 mg | ORAL_TABLET | Freq: Two times a day (BID) | ORAL | 0 refills | Status: AC
Start: 2021-08-21 — End: ?
  Filled 2021-08-21: qty 60, 30d supply, fill #0

## 2021-08-21 MED ORDER — METFORMIN HCL 500 MG PO TABS
1000.0000 mg | ORAL_TABLET | Freq: Every day | ORAL | Status: DC
  Administered 2021-08-22: 1000 mg via ORAL
  Filled 2021-08-21: qty 2

## 2021-08-21 MED ORDER — PANTOPRAZOLE SODIUM 40 MG PO TBEC
40.0000 mg | DELAYED_RELEASE_TABLET | Freq: Every day | ORAL | 0 refills | Status: AC
Start: 2021-08-21 — End: ?
  Filled 2021-08-21: qty 30, 30d supply, fill #0

## 2021-08-21 MED ORDER — ATORVASTATIN CALCIUM 20 MG PO TABS
20.0000 mg | ORAL_TABLET | Freq: Every day | ORAL | 0 refills | Status: DC
  Filled 2021-08-21: qty 30, 30d supply, fill #0

## 2021-08-21 MED ORDER — VITAMIN D (ERGOCALCIFEROL) 1.25 MG (50000 UNIT) PO CAPS
50000.0000 [IU] | ORAL_CAPSULE | ORAL | 0 refills | Status: DC
  Filled 2021-08-21: qty 5, 35d supply, fill #0

## 2021-08-21 MED ORDER — ASPIRIN 81 MG PO TBEC: 81.0000 mg | DELAYED_RELEASE_TABLET | Freq: Every day | ORAL | 11 refills | Status: DC

## 2021-08-21 MED ORDER — MAGNESIUM OXIDE 400 MG PO TABS
400.0000 mg | ORAL_TABLET | Freq: Every day | ORAL | 0 refills | Status: AC
Start: 2021-08-21 — End: ?
  Filled 2021-08-21: qty 30, 30d supply, fill #0

## 2021-08-21 MED ORDER — ACETAMINOPHEN 325 MG PO TABS
650.0000 mg | ORAL_TABLET | ORAL | Status: AC | PRN
Start: 2021-08-21 — End: ?

## 2021-08-21 MED ORDER — METFORMIN HCL 500 MG PO TABS
1000.0000 mg | ORAL_TABLET | Freq: Two times a day (BID) | ORAL | 0 refills | Status: AC
  Filled 2021-08-21: qty 120, 30d supply, fill #0

## 2021-08-21 MED ORDER — B COMPLEX-C PO TABS
1.0000 | ORAL_TABLET | Freq: Every day | ORAL | 0 refills | Status: DC
Start: 2021-08-21 — End: 2022-08-08
  Filled 2021-08-21: qty 30, 30d supply, fill #0

## 2021-08-21 MED ORDER — ALBUTEROL SULFATE HFA 108 (90 BASE) MCG/ACT IN AERS
1.0000 | INHALATION_SPRAY | Freq: Four times a day (QID) | RESPIRATORY_TRACT | 0 refills | Status: DC | PRN
  Filled 2021-08-21: qty 8.5, 25d supply, fill #0

## 2021-08-21 NOTE — Progress Notes (Signed)
Inpatient Rehabilitation Care Coordinator Discharge Note   Patient Details  Name: Alyssa Mayo MRN: [id number] Date of Birth: Apr 25, 1946   Discharge location: HOME WITH SON AND HIS FAMILY AWARE OF 24/7 SUPERVISION LEVEL  Length of Stay:  15 DAYS  Discharge activity level: SUPERVISION LEVEL  Home/community participation: ACTIVE  Patient response LF:YBOFBP Literacy - How often do you need to have someone help you when you read instructions, pamphlets, or other written material from your doctor or pharmacy?: Never  Patient response ZW:CHENID Isolation - How often do you feel lonely or isolated from those around you?: Rarely  Services provided included: MD, RD, PT, OT, SLP, RN, CM, TR, Pharmacy, SW  Financial Services:  Field seismologist Utilized: Private Insurance [employer] MEDICARE  Choices offered to/list presented to: PT AND SON  Follow-up services arranged:  Home Health, Patient/Family has no preference for HH/DME agencies Home Health Agency: [facility]-PT, OT,SP  HAS CANE FROM PREVIOUS ADMITS NO OTHER NEEDS        Patient response to transportation need: Is the patient able to respond to transportation needs?: Yes In the past 12 months, has lack of transportation kept you from medical appointments or from getting medications?: No In the past 12 months, has lack of transportation kept you from meetings, work, or from getting things needed for daily living?: No    Comments (or additional information): SON WAS HERE FOR EDUCATION DAY BEFORE DC HOME. AWARE OF PT'S CARE NEEDS.   Patient/Family verbalized understanding of follow-up arrangements:  Yes  Individual responsible for coordination of the follow-up plan: GREG-SON [phone]  Confirmed correct DME delivered: Lucy Chris 08/21/2021    Twain Stenseth, Lemar Livings

## 2021-08-21 NOTE — Progress Notes (Signed)
Patient ID: Alyssa Mayo, female   DOB: 01/24/1946, 76 y.o.   MRN: [id number]  Son here for education in preparation for discharge tomorrow. Pt feels ready to go home and has made good progress here. Sh still has inattention to her left and needs supervision for safety at home. Pt is set up with [facility] and has no other equipment needs. Has a cane from before. Aware of team conference update.

## 2021-08-21 NOTE — Progress Notes (Signed)
PROGRESS NOTE   Subjective/Complaints: BP a little elevated today after stopping amlodipine as per patient's preference yesterday Team conference today Sleepy this morning  ROS: Patient denies fever, rash, sore throat, blurred vision, nausea, vomiting, diarrhea, cough, shortness of breath or chest pain, joint or back/neck pain, headache, or mood change. +dizziness from low blood pressure  Objective:   No results found. No results for input(s): WBC, HGB, HCT, PLT in the last 72 hours.  No results for input(s): NA, K, CL, CO2, GLUCOSE, BUN, CREATININE, CALCIUM in the last 72 hours.   Intake/Output Summary (Last 24 hours) at 08/21/2021 1044 Last data filed at 08/21/2021 0800 Gross per 24 hour  Intake 456 ml  Output --  Net 456 ml        Physical Exam: Vital Signs Blood pressure (!) 148/75, pulse 79, temperature 98.3 F (36.8 C), temperature source Oral, resp. rate 16, height 5\' 3"  (1.6 m), weight 93.6 kg, SpO2 95 %. Gen: no distress, normal appearing, BMI 36.55 HEENT: oral mucosa pink and moist, NCAT Cardio: Reg rate Chest: normal effort, normal rate of breathing Abd: soft, non-distended Ext: no edema   Psych: pleasant and cooperative, a little flat Skin: loop recorder site well healed. Skin intact otherwise  Neurological/MSK     Comments: Patient is alert.  Makes eye contact with examiner.  No acute distress.  Follows commands.  Provides name age and date of birth.  Fair insight and awareness.  Strength intact except for LUE which has 3/5 hand grasp. Sensation intact. LUE tenderness. Left sided neglect ongoing     Assessment/Plan: 1. Functional deficits which require 3+ hours per day of interdisciplinary therapy in a comprehensive inpatient rehab setting. Physiatrist is providing close team supervision and 24 hour management of active medical problems listed below. Physiatrist and rehab team continue to assess  barriers to discharge/monitor patient progress toward functional and medical goals  Care Tool:  Bathing              Bathing assist Assist Level: Minimal Assistance - Patient > 75%     Upper Body Dressing/Undressing Upper body dressing   What is the patient wearing?: Pull over shirt    Upper body assist Assist Level: Minimal Assistance - Patient > 75%    Lower Body Dressing/Undressing Lower body dressing      What is the patient wearing?: Pants     Lower body assist Assist for lower body dressing: Minimal Assistance - Patient > 75%     Toileting Toileting    Toileting assist Assist for toileting: Minimal Assistance - Patient > 75%     Transfers Chair/bed transfer  Transfers assist     Chair/bed transfer assist level: Supervision/Verbal cueing Chair/bed transfer assistive device: Theatre manager   Ambulation assist      Assist level: Contact Guard/Touching assist Assistive device: Cane-straight Max distance: 300   Walk 10 feet activity   Assist     Assist level: Contact Guard/Touching assist Assistive device: Cane-straight   Walk 50 feet activity   Assist    Assist level: Contact Guard/Touching assist Assistive device: Cane-straight    Walk 150 feet activity   Assist  Walk 150 feet activity did not occur: Safety/medical concerns  Assist level: Contact Guard/Touching assist Assistive device: Cane-straight    Walk 10 feet on uneven surface  activity   Assist Walk 10 feet on uneven surfaces activity did not occur: Safety/medical concerns   Assist level: Contact Guard/Touching assist Assistive device: Cane-straight   Wheelchair     Assist Is the patient using a wheelchair?: No   Wheelchair activity did not occur: N/A         Wheelchair 50 feet with 2 turns activity    Assist    Wheelchair 50 feet with 2 turns activity did not occur: N/A       Wheelchair 150 feet activity     Assist   Wheelchair 150 feet activity did not occur: N/A       Blood pressure (!) 148/75, pulse 79, temperature 98.3 F (36.8 C), temperature source Oral, resp. rate 16, height 5\' 3"  (1.6 m), weight 93.6 kg, SpO2 95 %.    dical Problem List and Plan: 1. 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  Status post loop recorder             -may not shower until loop recorder incision has heals             -ELOS/Goals: 5-7 days S  Continue CIR therapies including PT, OT, and SLP   Start B/C vitamin complex to aid stroke recovery.   HFU scheduled for February.  2.  Antithrombotics: -DVT/anticoagulation:  Mechanical: Antiembolism stockings, thigh (TED hose) Bilateral lower extremities             -antiplatelet therapy: Aspirin 81 mg daily 3. LUE pain: Tylenol needed. Continue kpad 4. Mood: Provide emotional support             -antipsychotic agents: N/A 5. Neuropsych: This patient is capable of making decisions on her own behalf. 6. Skin/Wound Care: Routine skin checks 7. Fluids/Electrolytes/Nutrition: Routine in and outs with follow-up chemistries 8.  Hypertension.  d/c norvasc given dizziness with diastolic to 49. Monitor with increased mobility. Increase magnesium gluconate to 500mg  HS   1/28 bp under reasonable control 9.  Hyperlipidemia.  Lipitor  10.  Diabetes mellitus.  Hemoglobin A1c 7.4.  Presently on SSI.  Patient on Glucotrol XL 5 mg twice daily, Glucophage 1000 mg twice daily, Farxiga 10 mg daily prior to admission.  Increase Metformin to 1000mg . Discussed that she will not need insulin at home. Discussed improvements in Cbgs with her.   CBG (last 3)  Recent Labs    08/20/21 1722 08/20/21 2126 08/21/21 0625  GLUCAP 151* 201* 149*   11.  Obesity.  BMI 36.49-->36.55.  Dietary follow-up 12. AKI: Resolved. Pushing fluids 13. Hypoalbuminemia: discussed high blood sugars are likely binding to proteins in her blood stream, advised lo sugar/high  protein diet.  14. Hypokalemia: supplement 1/21 and 1/25, 4.0  15. Suboptimal Vitamin D: start ergocalciferol 50,000U once per week for 7 weeks.  16. GERD: wean Protonix to 20mg  since no apparent indication. Will maintain this dose since it does help with her GERD. Discussed with her lowering the dose.   17. Constipation: continue magnesium gluconate 250mg HS 18. Slow processing: continue SLP 19. Cognitive deficits: continue SLP 20. Tachycardia: EKG obtained and shows normal sinus rhythm. Resolved. Continue to monitor TID 21. Pneumonia: CXR obtained and stable. Discussed results with her. Encouraged incentive spirometer use.  22. Cough: asked nursing to bring her tea with honey, improved.  LOS: 14 days A FACE TO FACE EVALUATION WAS PERFORMED  Alyssa Mayo 08/21/2021, 10:44 AM

## 2021-08-21 NOTE — Plan of Care (Signed)
°  Problem: RH Car Transfers Goal: LTG Patient will perform car transfers with assist (PT) Description: LTG: Patient will perform car transfers with assistance (PT). Outcome: Adequate for Discharge Flowsheets (Taken 08/21/2021 1248) LTG: Pt will perform car transfers with assist:: Contact Guard/Touching assist   Problem: RH Ambulation Goal: LTG Patient will ambulate in home environment (PT) Description: LTG: Patient will ambulate in home environment, # of feet with assistance (PT). Outcome: Adequate for Discharge Flowsheets (Taken 08/21/2021 1248) LTG: Pt will ambulate in home environ  assist needed:: Contact Guard/Touching assist LTG: Ambulation distance in home environment: 186ft   Problem: RH Ambulation Goal: LTG Patient will ambulate in controlled environment (PT) Description: LTG: Patient will ambulate in a controlled environment, # of feet with assistance (PT). Outcome: Adequate for Discharge Flowsheets Taken 08/21/2021 1248 by Alvira Philips, Student-PT LTG: Pt will ambulate in controlled environ  assist needed:: Contact Guard/Touching assist Taken 08/16/2021 1555 by Ginnie Smart P, PT LTG: Ambulation distance in controlled environment: 171ft

## 2021-08-21 NOTE — Progress Notes (Signed)
Inpatient Rehabilitation Discharge Medication Review by a Pharmacist  A complete drug regimen review was completed for this patient to identify any potential clinically significant medication issues.  High Risk Drug Classes Is patient taking? Indication by Medication  Antipsychotic No   Anticoagulant No   Antibiotic No   Opioid No   Antiplatelet Yes ASA - CVA  Hypoglycemics/insulin Yes Dapagliflozin, glipizide, metformin - DM  Vasoactive Medication Yes Amlodipine - HTN  Chemotherapy No   Other Yes Atorvastatin - HLD Protonix - GERD     Type of Medication Issue Identified Description of Issue Recommendation(s)  Drug Interaction(s) (clinically significant)     Duplicate Therapy     Allergy     No Medication Administration End Date     Incorrect Dose     Additional Drug Therapy Needed     Significant med changes from prior encounter (inform family/care partners about these prior to discharge).    Other       Clinically significant medication issues were identified that warrant physician communication and completion of prescribed/recommended actions by midnight of the next day:  No  Time spent performing this drug regimen review (minutes):  30  Alyssa Mayo BS, PharmD, BCPS Clinical Pharmacist 08/21/2021 09:29 AM

## 2021-08-21 NOTE — Progress Notes (Signed)
Physical Therapy Session Note  Patient Details  Name: Alyssa Mayo MRN: [id number] Date of Birth: 09-02-1945  Today's Date: 08/21/2021 PT Individual Time: 1430-1525 PT Individual Time Calculation (min): 55 min   Short Term Goals: Week 2:  PT Short Term Goal 1 (Week 2): STG=LTG due to ELOS  Skilled Therapeutic Interventions/Progress Updates:    Pt met sitting in recliner, son present, ready and agreeable for session. Discussed plan for session and answered any questions from pt or son. Pt ambulated CGA w/ SPC to ADL apartment ~246ft for bed mobility. Completed bed mobility w/ S and pt and son voiced understanding for set up and S assist level. Pt ambulated w/ son CGA w/ SPC to ortho gym for car transfer. Son demonstrated ability to ambulate CGA w/ pt w/o difficulty. Car transfer completed w/ S. Visual scanning completed on BITS in standing w/ pt demonstrating improved ability to scan L in busy environment. Pt and son voiced understanding of continued work on scanning L in environment. Pt ambulated to 73M rehab gym for stairs x12 w/ R hand rail and 1 curb transfer CGA. Pt and son encouraged w/ progress made and d/c recommendations. During rest break, pt and son educated on d/c procedure, follow up PT, BEFAST w/ handout, home recommendations of sitting and playing w/ grandchild, and ambulating CGA w/ SPC. Son ambulated w/ pt back to room CGA w/ [facility] and son approved for in room transfers w/ safety plan updated. Pt left sitting in chair w/ son present and all questions answered regarding follow up care.  Therapy Documentation Precautions:  Precautions Precautions: Fall Precaution Comments: Loop recorder; left neglect versus visual field cut Restrictions Weight Bearing Restrictions: No General:   Other Treatments:      Therapy/Group: Individual Therapy  Alvira Philips 08/21/2021, 12:34 PM

## 2021-08-21 NOTE — Plan of Care (Signed)
Problem: RH Problem Solving Goal: LTG Patient will demonstrate problem solving for (SLP) Description: LTG:  Patient will demonstrate problem solving for basic/complex daily situations with cues  (SLP) Outcome: Completed/Met   Problem: RH Memory Goal: LTG Patient will demonstrate ability for day to day (SLP) Description: LTG:   Patient will demonstrate ability for day to day recall/carryover during cognitive/linguistic activities with assist  (SLP) Outcome: Completed/Met   Problem: RH Awareness Goal: LTG: Patient will demonstrate awareness during functional activites type of (SLP) Description: LTG: Patient will demonstrate awareness during functional activites type of (SLP) Outcome: Completed/Met   Problem: RH Expression Communication Goal: LTG Patient will increase speech intelligibility (SLP) Description: LTG: Patient will increase speech intelligibility at word/phrase/conversation level with cues, % of the time (SLP) Outcome: Completed/Met

## 2021-08-21 NOTE — Progress Notes (Signed)
Occupational Therapy Discharge Summary  Patient Details  Name: Alyssa Mayo MRN: [id number] Date of Birth: 1946/06/07  Today's Date: 08/21/2021 OT Individual Time: 1330-1430 OT Individual Time Calculation (min): 60 min    Patient has met 7 of 7 long term goals due to improved activity tolerance, improved balance, postural control, ability to compensate for deficits, functional use of  LEFT upper and LEFT lower extremity, improved attention, improved awareness, and improved coordination.  Patient to discharge at [facility] Assist level. Pt now able to complete UB self care with supervision and LB self care with min assist. Pt is able to complete functional mobility using SPC with close supervision and cues to attend to left side of environment. Caregiver education completed today and pts son demonstrated good understanding of all assist techniques for functional mobility and ADL completion.  Patient's care partner is independent to provide the necessary physical and cognitive assistance at discharge.    Skilled Intervention:  Pt semi reclined in bed with son at bedside. Pt requested to take a shower during family education session. Pt completed supine to sit with supervision.  Sit to stand and ambulation to bathroom using SPC.  Educated pts son regarding pts left neglect and inattention and proper cues to provide to facilitate pt attending to left.  Pt completed shower bench transfer and bathed with supervision using grab bars.  Discussed recommendation of grab bar installation at home for safe bathing.  Pts son receptive to adding grab bars.  Pt dried off and ambulated back to bedroom and stand to sit at recliner with close supervision needing cues to attend to left door frame and sink.  Pt completed UB dressing with supervision to thread LUE into sleeve.  Min assist to donn pull ups and pants and son assist pt appropriately with this.  Pt ambulated to sink and blow dried hair with min assist and  cues to look in mirror to attend to LUE.  Pt returned to recliner and direct hand off to PT.  Reasons goals not met: na  Recommendation:  Patient will benefit from ongoing skilled OT services in outpatient setting to continue to advance functional skills in the area of BADL and , left attention, visual motor skills, and LUE neuro re-ed .  Equipment: No equipment provided  Reasons for discharge: treatment goals met and discharge from hospital  Patient/family agrees with progress made and goals achieved: Yes  OT Discharge Precautions/Restrictions  Precautions Precautions: Fall Precaution Comments: loop recorder and left neglect Restrictions Weight Bearing Restrictions: No Pain Pain Assessment Pain Scale: 0-10 Pain Score: 0-No pain ADL ADL Eating: Set up Where Assessed-Eating: Chair Grooming: Supervision/safety Where Assessed-Grooming: Sitting at sink Upper Body Bathing: Supervision/safety Where Assessed-Upper Body Bathing: Shower Lower Body Bathing: Supervision/safety Where Assessed-Lower Body Bathing: Shower Upper Body Dressing: Supervision/safety Where Assessed-Upper Body Dressing: Chair Lower Body Dressing: Minimal assistance Where Assessed-Lower Body Dressing: Chair Toileting: Minimal assistance Where Assessed-Toileting: Glass blower/designer: Close supervision Toilet Transfer Method: Counselling psychologist: Energy manager: Close supervision Social research officer, government Method: Heritage manager: Grab bars Vision Baseline Vision/History: 1 Wears glasses Patient Visual Report: No change from baseline Vision Assessment?: Yes Eye Alignment: Within Functional Limits Ocular Range of Motion: Within Functional Limits Alignment/Gaze Preference: Within Defined Limits Tracking/Visual Pursuits: Decreased smoothness of horizontal tracking;Requires cues, head turns, or add eye shifts to track Saccades: Additional head turns  occurred during testing;Additional eye shifts occurred during testing Visual Fields: Left visual field deficit Perception  Perception: Impaired Inattention/Neglect: Does not attend to left visual field;Does not attend to left side of body Praxis Praxis: Impaired Praxis Impairment Details: Motor planning Cognition Overall Cognitive Status: Within Functional Limits for tasks assessed Arousal/Alertness: Awake/alert Orientation Level: Oriented X4 Year: 2023 Month: January Day of Week: Correct Attention: Selective;Focused;Sustained Focused Attention: Appears intact Sustained Attention: Appears intact Selective Attention: Impaired Selective Attention Impairment: Functional basic Memory: Impaired Memory Impairment: Decreased recall of new information Immediate Memory Recall: Sock;Blue;Bed Memory Recall Sock: Without Cue Memory Recall Blue: Without Cue Memory Recall Bed: Without Cue Awareness: Impaired Awareness Impairment: Emergent impairment Problem Solving: Impaired Problem Solving Impairment: Verbal complex;Functional complex Executive Function: Self Monitoring Self Monitoring: Impaired Self Monitoring Impairment: Functional complex Behaviors: Impulsive (mild) Safety/Judgment: Appears intact Sensation Sensation Light Touch: Appears Intact Hot/Cold: Appears Intact Proprioception: Impaired Detail Proprioception Impaired Details: Impaired LUE Stereognosis: Impaired Detail Stereognosis Impaired Details: Impaired LUE Coordination Gross Motor Movements are Fluid and Coordinated: Yes Fine Motor Movements are Fluid and Coordinated: No (impaired ability to perform pincer grasp, and isolate movements for in hand manipulation and functional pinching of objects.) Motor  Motor Motor: Hemiplegia Motor - Skilled Clinical Observations: Alyssa hemiplegia Mobility  Bed Mobility Bed Mobility: Rolling Right;Rolling Left;Left Sidelying to Sit Rolling Right: Supervision/verbal cueing Rolling  Left: Supervision/Verbal cueing Left Sidelying to Sit: Supervision/Verbal cueing Sit to Supine: Supervision/Verbal cueing Scooting to HOB: Supervision/Verbal Cueing Transfers Sit to Stand: Supervision/Verbal cueing Stand to Sit: Supervision/Verbal cueing  Trunk/Postural Assessment  Cervical Assessment Cervical Assessment: Within Functional Limits Thoracic Assessment Thoracic Assessment: Exceptions to [facility] (mild kyphosis) Lumbar Assessment Lumbar Assessment: Within Functional Limits Postural Control Postural Control: Within Functional Limits  Balance Balance Balance Assessed: Yes Static Sitting Balance Static Sitting - Balance Support: Feet supported Static Sitting - Level of Assistance: 7: Independent Dynamic Sitting Balance Dynamic Sitting - Level of Assistance: 7: Independent Static Standing Balance Static Standing - Balance Support: Right upper extremity supported Static Standing - Level of Assistance: 5: Stand by assistance Dynamic Standing Balance Dynamic Standing - Balance Support: Right upper extremity supported Dynamic Standing - Level of Assistance: 5: Stand by assistance Extremity/Trunk Assessment RUE Assessment RUE Assessment: Within Functional Limits LUE Assessment General Strength Comments: Shoulder 3+/5 ER/IR (3-/5 abduction and flexion); elbow 3+/5 flex and ext; forearm 3+/5 wrist 3+/5 digits 3/5 LUE Body System: Neuro Brunstrum levels for arm and hand: Arm;Hand Brunstrum level for arm: Stage V Relative Independence from Synergy Brunstrum level for hand: Stage IV Movements deviating from synergies   Alyssa Mayo Alyssa Mayo 08/21/2021, 3:45 PM

## 2021-08-21 NOTE — Progress Notes (Signed)
Pt has home CPAP. Pt stated she will place CPAP on without RT assistance.

## 2021-08-21 NOTE — Progress Notes (Signed)
Physical Therapy Discharge Summary  Patient Details  Name: Alyssa Mayo MRN: [id number] Date of Birth: 07-Nov-1945  Today's Date: 08/21/2021 PT Individual Time: 1002-1100 PT Individual Time Calculation (min): 58 min    Patient has met 6 of 9 long term goals due to improved activity tolerance, improved balance, increased strength, and improved coordination. Pt currently CGA for remaining goals. Patient to discharge at an ambulatory level Supervision.   Patient's care partner is independent to provide the necessary physical assistance at discharge.  Pt met asleep in bed, slowly woken up, and reported no current LBP. Supine<>sit EOB w/ head of bed elevated w/ S. Pt ambulated to 62mrehab gym ~1532fw/ SPC CGA. Motor and sensory testing completed as noted below. Berg and tug x3 trials completed as noted below. Pt demonstrated difficulty w/ narrow BoS, reaching outside BoS, and weight shifting during turns. Pt ambulated to ADL apartment for bed mobility. Transferred to bed and completed bed mobility supine<>rolling L to sitting EOB w/ S. Pt educated on BEFAST during rest break. [id number] to recliner, posey belt on, call bell next to pt, and all needs met.  BerMerrilee Jansky*Patient demonstrated increased fall risk noted by score of 40/56 on the Berg Balance Scale.  <45/56 = fall risk, <42/56 = predictive of recurrent falls, <40/56 = 100% fall risk  >41 = independent, 21-40 = assistive device, 0-20 = wheelchair level  MDC 6.9 (4 pts 45-56, 5 pts 35-44, 7 pts 25-34) (ANPTA Core Set of Outcome Measures for Adults with Neurologic Conditions, 2018)  TUG - 1) 21.6, 2) 17.5 - AVG - 19.5 *TUG > 13.5s = increased falls risk  Reasons goals not met: Pt CGA for remaining goals due to poor safety awareness, mild impulsivity, L inattention.  Recommendation:  Patient will benefit from ongoing skilled PT services in outpatient setting to continue to advance safe functional  mobility, address ongoing impairments in L inattention, mild impulsivity, poor safety awareness, and minimize fall risk.  Equipment: No equipment provided  Reasons for discharge: discharge from hospital  Patient/family agrees with progress made and goals achieved: Yes  PT Discharge Precautions/Restrictions Precautions Precautions: Fall Restrictions Weight Bearing Restrictions: No Vital Signs  Pain Pain Assessment Pain Scale: 0-10 Pain Score: 0-No pain Pain Interference Pain Interference Pain Effect on Sleep: 2. Occasionally Pain Interference with Therapy Activities: 1. Rarely or not at all Pain Interference with Day-to-Day Activities: 2. Occasionally Vision/Perception  Vision - History Ability to See in Adequate Light: 0 Adequate Vision - Assessment Eye Alignment: Impaired (comment) Ocular Range of Motion: Within Functional Limits Alignment/Gaze Preference: Within Defined Limits Tracking/Visual Pursuits: Impaired - to be further tested in functional context;Decreased smoothness of horizontal tracking;Requires cues, head turns, or add eye shifts to track Perception Perception: Impaired Inattention/Neglect: Does not attend to left visual field Praxis Praxis: Impaired Praxis Impairment Details: Motor planning  Cognition Overall Cognitive Status: Within Functional Limits for tasks assessed Arousal/Alertness: Awake/alert Orientation Level: Oriented X4 Sensation Sensation Light Touch: Appears Intact Hot/Cold: Not tested Proprioception: Not tested Stereognosis: Not tested Coordination Gross Motor Movements are Fluid and Coordinated: Yes Fine Motor Movements are Fluid and Coordinated: Not tested Motor  Motor Motor: Hemiplegia Motor - Skilled Clinical Observations: L hemiplegia  Mobility Bed Mobility Bed Mobility: Rolling Right;Rolling Left;Left Sidelying to Sit Rolling Right: Supervision/verbal cueing Rolling Left: Supervision/Verbal cueing Left Sidelying to Sit:  Supervision/Verbal cueing Sit to Supine: Supervision/Verbal cueing Scooting to HOB: Supervision/Verbal Cueing Transfers Transfers: Sit to  Stand;Stand to Sit Sit to Stand: Supervision/Verbal cueing Stand to Sit: Supervision/Verbal cueing Stand Pivot Transfers: Supervision/Verbal cueing Transfer (Assistive device): Straight cane Locomotion  Gait Ambulation: Yes Gait Assistance: Contact Guard/Touching assist Gait Distance (Feet): 150 Feet Assistive device: Straight cane Gait Gait: No Stairs / Additional Locomotion Stairs: Yes Stairs Assistance: Contact Guard/Touching assist Stair Management Technique: One rail Right Number of Stairs: 12 Ramp: Contact Guard/touching assist Curb: Contact Guard/Touching assist Wheelchair Mobility Wheelchair Mobility: No  Trunk/Postural Assessment  Cervical Assessment Cervical Assessment: Within Functional Limits Thoracic Assessment Thoracic Assessment: Exceptions to WFL (kyphosis) Lumbar Assessment Lumbar Assessment: Within Functional Limits Postural Control Postural Control: Within Functional Limits  Balance Balance Balance Assessed: Yes Standardized Balance Assessment Standardized Balance Assessment: Berg Balance Test Berg Balance Test Sit to Stand: Able to stand without using hands and stabilize independently Standing Unsupported: Able to stand safely 2 minutes Sitting with Back Unsupported but Feet Supported on Floor or Stool: Able to sit safely and securely 2 minutes Stand to Sit: Sits safely with minimal use of hands Transfers: Able to transfer safely, definite need of hands Standing Unsupported with Eyes Closed: Able to stand 10 seconds with supervision Standing Ubsupported with Feet Together: Able to place feet together independently and stand for 1 minute with supervision From Standing, Reach Forward with Outstretched Arm: Can reach forward >12 cm safely (5") From Standing Position, Pick up Object from Floor: Able to pick up shoe,  needs supervision From Standing Position, Turn to Look Behind Over each Shoulder: Turn sideways only but maintains balance Turn 360 Degrees: Able to turn 360 degrees safely but slowly Standing Unsupported, Alternately Place Feet on Step/Stool: Able to stand independently and complete 8 steps >20 seconds Standing Unsupported, One Foot in Front: Needs help to step but can hold 15 seconds Standing on One Leg: Tries to lift leg/unable to hold 3 seconds but remains standing independently Total Score: 40 Timed Up and Go Test TUG: Normal TUG Normal TUG (seconds): 19.5 Extremity Assessment      RLE Assessment RLE Assessment: Exceptions to [facility] RLE Strength RLE Overall Strength: Within Functional Limits for tasks assessed Right Hip Flexion: 4+/5 Right Hip ABduction: 5/5 Right Hip ADduction: 5/5 Right Knee Flexion: 5/5 Right Knee Extension: 5/5 Right Ankle Dorsiflexion: 4+/5 Right Ankle Plantar Flexion: 5/5 LLE Assessment LLE Assessment: Exceptions to [facility] LLE Strength LLE Overall Strength: Within Functional Limits for tasks assessed Left Hip Flexion: 4/5 Left Hip ABduction: 5/5 Left Hip ADduction: 5/5 Left Knee Flexion: 5/5 Left Knee Extension: 5/5 Left Ankle Dorsiflexion: 4+/5 Left Ankle Plantar Flexion: 5/5    [place], SPT  08/21/2021, 11:29 AM

## 2021-08-22 ENCOUNTER — Other Ambulatory Visit (HOSPITAL_COMMUNITY): Payer: Self-pay

## 2021-08-22 LAB — GLUCOSE, CAPILLARY: Glucose-Capillary: 145 mg/dL — ABNORMAL HIGH (ref 70–99)

## 2021-08-22 NOTE — Progress Notes (Signed)
Patient has home CPAP at bedside. 

## 2021-08-22 NOTE — Progress Notes (Signed)
PROGRESS NOTE   Subjective/Complaints: Discussed improvements in labwork.  She is ready for d/c today    ROS: Patient denies fever, rash, sore throat, blurred vision, nausea, vomiting, diarrhea, cough, shortness of breath or chest pain, joint or back/neck pain, headache, or mood change. +dizziness from low blood pressure- resolved  Objective:   No results found. No results for input(s): WBC, HGB, HCT, PLT in the last 72 hours.  No results for input(s): NA, K, CL, CO2, GLUCOSE, BUN, CREATININE, CALCIUM in the last 72 hours.   Intake/Output Summary (Last 24 hours) at 08/22/2021 1001 Last data filed at 08/22/2021 0700 Gross per 24 hour  Intake 700 ml  Output --  Net 700 ml        Physical Exam: Vital Signs Blood pressure (!) 157/65, pulse 90, temperature 98.2 F (36.8 C), resp. rate 14, height 5\' 3"  (1.6 m), weight 93.6 kg, SpO2 93 %. Gen: no distress, normal appearing, BMI 36.55 HEENT: oral mucosa pink and moist, NCAT Cardio: Reg rate Chest: normal effort, normal rate of breathing Abd: soft, non-distended Ext: no edema   Psych: pleasant and cooperative, a little flat Skin: loop recorder site well healed. Skin intact otherwise  Neurological/MSK     Comments: Patient is alert.  Makes eye contact with examiner.  No acute distress.  Follows commands.  Provides name age and date of birth.  Fair insight and awareness.  Strength intact except for LUE which has 3/5 hand grasp. Sensation intact. LUE tenderness. Left sided neglect ongoing. Supine to sit with MinA.     Assessment/Plan: 1. Functional deficits which require 3+ hours per day of interdisciplinary therapy in a comprehensive inpatient rehab setting. Physiatrist is providing close team supervision and 24 hour management of active medical problems listed below. Physiatrist and rehab team continue to assess barriers to discharge/monitor patient progress toward  functional and medical goals  Care Tool:  Bathing              Bathing assist Assist Level: Supervision/Verbal cueing     Upper Body Dressing/Undressing Upper body dressing   What is the patient wearing?: Pull over shirt    Upper body assist Assist Level: Supervision/Verbal cueing    Lower Body Dressing/Undressing Lower body dressing      What is the patient wearing?: Pants     Lower body assist Assist for lower body dressing: Minimal Assistance - Patient > 75%     Toileting Toileting    Toileting assist Assist for toileting: Minimal Assistance - Patient > 75%     Transfers Chair/bed transfer  Transfers assist     Chair/bed transfer assist level: Supervision/Verbal cueing Chair/bed transfer assistive device:   Ambulation assist      Assist level: Contact Guard/Touching assist Assistive device: Cane-straight Max distance: 152ft   Walk 10 feet activity   Assist     Assist level: Contact Guard/Touching assist Assistive device: Cane-straight   Walk 50 feet activity   Assist    Assist level: Contact Guard/Touching assist Assistive device: Cane-straight    Walk 150 feet activity   Assist Walk 150 feet activity did not occur: Safety/medical concerns  Assist  level: Contact Guard/Touching assist Assistive device: Cane-straight    Walk 10 feet on uneven surface  activity   Assist Walk 10 feet on uneven surfaces activity did not occur: Safety/medical concerns   Assist level: Contact Guard/Touching assist Assistive device: Cane-straight   Wheelchair     Assist Is the patient using a wheelchair?: No   Wheelchair activity did not occur: N/A         Wheelchair 50 feet with 2 turns activity    Assist    Wheelchair 50 feet with 2 turns activity did not occur: N/A       Wheelchair 150 feet activity     Assist  Wheelchair 150 feet activity did not occur: N/A       Blood pressure  (!) 157/65, pulse 90, temperature 98.2 F (36.8 C), resp. rate 14, height 5\' 3"  (1.6 m), weight 93.6 kg, SpO2 93 %.    dical Problem List and Plan: 1. 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  Status post loop recorder             -may not shower until loop recorder incision has heals             -ELOS/Goals: 5-7 days S  D/c home today  Start B/C vitamin complex to aid stroke recovery.   HFU scheduled for February.  2.  Antithrombotics: -DVT/anticoagulation:  Mechanical: Antiembolism stockings, thigh (TED hose) Bilateral lower extremities             -antiplatelet therapy: Aspirin 81 mg daily 3. LUE pain: Tylenol needed. Continue kpad 4. Mood: Provide emotional support             -antipsychotic agents: N/A 5. Neuropsych: This patient is capable of making decisions on her own behalf. 6. Skin/Wound Care: Routine skin checks 7. Fluids/Electrolytes/Nutrition: Routine in and outs with follow-up chemistries 8.  Hypertension.  d/c norvasc given dizziness with diastolic to 49. Monitor with increased mobility. Increase magnesium gluconate to 500mg  HS   1/28 bp under reasonable control 9.  Hyperlipidemia.  Lipitor  10.  Diabetes mellitus.  Hemoglobin A1c 7.4.  Presently on SSI.  Patient on Glucotrol XL 5 mg twice daily, Glucophage 1000 mg twice daily, Farxiga 10 mg daily prior to admission.  Increase Metformin to 1000mg . Discussed that she will not need insulin at home. Discussed improvements in Cbgs with her. Continue to uptitrate metformin outpatient as needed.   CBG (last 3)  Recent Labs    08/21/21 1634 08/21/21 2110 08/22/21 0622  GLUCAP 139* 192* 145*   11.  Obesity.  BMI 36.49-->36.55.  Dietary follow-up 12. AKI: Resolved. Pushing fluids 13. Hypoalbuminemia: discussed high blood sugars are likely binding to proteins in her blood stream, advised lo sugar/high protein diet.  14. Hypokalemia: supplement 1/21 and 1/25, 4.0  15. Suboptimal  Vitamin D: start ergocalciferol 50,000U once per week for 7 weeks.  16. GERD: wean Protonix to 20mg  since no apparent indication. Will maintain this dose since it does help with her GERD. Discussed with her lowering the dose.   17. Constipation: continue magnesium gluconate 250mg HS 18. Slow processing: continue outpatient SLP 19. Cognitive deficits: continue outpatient SLP 20. Tachycardia: EKG obtained and shows normal sinus rhythm. Resolved. Continue to monitor TID 21. Pneumonia: CXR obtained and stable. Discussed results with her. Encouraged incentive spirometer use.  22. Cough: asked nursing to bring her tea with honey, improved.    >30 minutes spent in discharge of patient including review of  medications and follow-up appointments, physical examination, and in answering all patient's questions   LOS: 15 days A FACE TO FACE EVALUATION WAS PERFORMED  Elodia Haviland P Vincent Ehrler 08/22/2021, 10:01 AM

## 2021-08-26 ENCOUNTER — Telehealth: Payer: Self-pay

## 2021-08-26 NOTE — Telephone Encounter (Signed)
Transitional Care call--son [name]    Are you/is patient experiencing any problems since coming home? No Are there any questions regarding any aspect of care? No Are there any questions regarding medications administration/dosing? No Are meds being taken as prescribed? Yes Patient should review meds with caller to confirm Have there been any falls? No Has Home Health been to the house and/or have they contacted you? Yes If not, have you tried to contact them? Can we help you contact them? Are bowels and bladder emptying properly? Yes Are there any unexpected incontinence issues? No If applicable, is patient following bowel/bladder programs? Any fevers, problems with breathing, unexpected pain? No Are there any skin problems or new areas of breakdown? No Has the patient/family member arranged specialty MD follow up (ie cardiology/neurology/renal/surgical/etc)? Yes Can we help arrange? Does the patient need any other services or support that we can help arrange? No Are caregivers following through as expected in assisting the patient? Yes Has the patient quit smoking, drinking alcohol, or using drugs as recommended? Yes  Appointment time 11:00 am , arrive time 10:40 am on 09/16/21 with Dr. Carlis Abbott [address] [phone]

## 2021-09-11 ENCOUNTER — Ambulatory Visit (INDEPENDENT_AMBULATORY_CARE_PROVIDER_SITE_OTHER): Payer: Medicare HMO

## 2021-09-11 DIAGNOSIS — I639 Cerebral infarction, unspecified: Secondary | ICD-10-CM

## 2021-09-12 LAB — CUP PACEART REMOTE DEVICE CHECK
Date Time Interrogation Session: 20230222142318
Implantable Pulse Generator Implant Date: 20230116

## 2021-09-16 ENCOUNTER — Encounter: Payer: Self-pay | Admitting: Physical Medicine and Rehabilitation

## 2021-09-16 ENCOUNTER — Encounter: Payer: Medicare HMO | Attending: Physical Medicine and Rehabilitation | Admitting: Physical Medicine and Rehabilitation

## 2021-09-16 ENCOUNTER — Other Ambulatory Visit: Payer: Self-pay

## 2021-09-16 VITALS — BP 129/80 | HR 96 | Ht 63.0 in | Wt 192.0 lb

## 2021-09-16 DIAGNOSIS — I63511 Cerebral infarction due to unspecified occlusion or stenosis of right middle cerebral artery: Secondary | ICD-10-CM | POA: Diagnosis not present

## 2021-09-16 DIAGNOSIS — E1149 Type 2 diabetes mellitus with other diabetic neurological complication: Secondary | ICD-10-CM | POA: Insufficient documentation

## 2021-09-16 NOTE — Patient Instructions (Addendum)
HTN: ?-Advised checking BP daily at home and logging results to bring into follow-up appointment with PCP and myself. ?-Reviewed BP meds today.  ?-Advised regarding healthy foods that can help lower blood pressure and provided with a list: ?1) citrus foods- high in vitamins and minerals ?2) salmon and other fatty fish - reduces inflammation and oxylipins ?3) swiss chard (leafy green)- high level of nitrates ?4) pumpkin seeds- one of the best natural sources of magnesium ?5) Beans and lentils- high in fiber, magnesium, and potassium ?6) Berries- high in flavonoids ?7) Amaranth (whole grain, can be cooked similarly to rice and oats)- high in magnesium and fiber ?8) Pistachios- even more effective at reducing BP than other nuts ?9) Carrots- high in phenolic compounds that relax blood vessels and reduce inflammation ?10) Celery- contain phthalides that relax tissues of arterial walls ?11) Tomatoes- can also improve cholesterol and reduce risk of heart disease ?12) Broccoli- good source of magnesium, calcium, and potassium ?13) Greek yogurt: high in potassium and calcium ?14) Herbs and spices: Celery seed, cilantro, saffron, lemongrass, black cumin, ginseng, cinnamon, cardamom, sweet basil, and ginger ?15) Chia and flax seeds- also help to lower cholesterol and blood sugar ?16) Beets- high levels of nitrates that relax blood vessels  ?17) spinach and bananas- high in potassium ? ?-Provided lise of supplements that can help with hypertension:  ?1) magnesium: one high quality brand is Bioptemizers since it contains all 7 types of magnesium, otherwise over the counter magnesium gluconate 400mg is a good option ?2) B vitamins ?3) vitamin D ?4) potassium ?5) CoQ10 ?6) L-arginine ?7) Vitamin C ?8) Beetroot ?-Educated that goal BP is 120/80. ?-Made goal to incorporate some of the above foods into diet.   ? ?Provided with list of supplements that can help with dyslipidemia: ?1) Vitamin B3 500-4,000mg in divided doses daily  (would recommend starting low as can cause uncomfortable facial flushing if started at too high a dose) ?2) Phytosterols 2.15 grams daily ?3) Fermented soy 30-50 grams daily ?4) EGCG (found in green tea): 500-1000mg daily ?5) Omega-3 fatty acids 3000-5,000mg daily ?6) Flax seed 40 grams daily ?7) Monounsaturated fats 20-40 grams daily (olives, olive oil, nuts), also reduces cardiovascular disease ?8) Sesame: 40 grams daily ?9) Gamma/delta tocotrienols- a family of unsaturated forms of Vitamin E- 200mg with dinner ?10) Pantethine 900mg daily in divided doses ?11) Resveratrol 250mg daily ?12) N Acetyl Cysteine 2000mg daily in divided doses ?13) Curcumin 2000-5000mg in divided doses daily ?14) Pomegranate juice: 8 ounces daily, also helps to lower blood pressure ?15) Pomegranate seeds one cup daily, also helps to lower blood pressure ?16) Citrus Bergamot 1000mg daily, also helps with glucose control and weight loss ?17) Vitamin C 500mg daily ?18) Quercetin 500-1000mg daily ?19) Glutathione ?20) Probiotics 60-100 billion organisms per day ?21) Fiber ?22) Oats ?23) Aged garlic (can eat as food or supplement of 600-900mg per day) ?24) Chia seeds 25 grams per day ?25) Lycopene- carotenoid found in high concentrations in tomatoes. ?26) Alpha linolenic acid ?27) Flavonoids and anthocyanins ?28) Wogonin- flavanoid that enhances reverse cholesterol transport ?29) Coenzyme Q10 ?30) Pantethine- derivative of Vitamin B5: 300mg three times per day or 450mg twice per day with or without food ?31) Barley and other whole grains ?32) Orange juice ?33) L- carnitine ?34) L- Lysine ?35) L- Arginine ?36) Almonds ?37) Morin ?38) Rutin ?39) Carnosine ?40) Histidine  ?41) Kaempferol  ?42) Organosulfur compounds ?43) Vitamin E ?44) Oleic acid ?45) RBO (ferulic acid gammaoryzanol) ?46) grape seed   extract ?47) Red wine ?48) Berberine HCL 500mg daily or twice per day- more effective and with fewer adverse effects that ezetimibe monotherapy ?49)  red yeast rice 2400- 4800 mg/day ?50) chlorella ?51) Licorice  ?

## 2021-09-16 NOTE — Progress Notes (Signed)
Subjective:    Patient ID: Alyssa Mayo, female    DOB: 1945/09/20, 76 y.o.   MRN: [id number]  HPI Alyssa Mayo is a 76 year old woman who presents for hospital follow-up after CIR admission for CVA. 1) CVA -getting out of bed without falling has been an issue -lower bed is easier for get out -she is moving pretty well with the cane -walks 5 or 10 minutes -has a handicap placard -she is following with Dr. Burnett Sheng.  2) HTN  3) Diabetic peripheral neuropathy -she is interested in Qutenza   Pain Inventory Average Pain 0 Pain Right Now 0 My pain is intermittent, sharp, and aching  LOCATION OF PAIN  headache, back pain  BOWEL Number of stools per week: 3-4   BLADDER Normal and Pads   Mobility use a cane how many minutes can you walk? 10-15 minutes ability to climb steps?  yes do you drive?  no Do you have any goals in this area?  yes - Function retired I need assistance with the following:  dressing, bathing, meal prep, household duties, and shopping Do you have any goals in this area?  yes  Neuro/Psych weakness trouble walking  Prior Studies Any changes since last visit?  no  Physicians involved in your care Any changes since last visit?  no   Family History  Problem Relation Age of Onset   Cancer Mother        breast   Heart disease Mother    Stroke Mother    Diabetes Mother    Colon cancer Mother    Cancer Father        leukemia   Stroke Father    Leukemia Father    Ovarian cancer Sister    Diabetes Brother    Cancer Brother    Stroke Maternal Grandfather    Dementia Paternal Grandmother    COPD Neg Hx    Hypertension Neg Hx    Social History   Socioeconomic History   Marital status: Widowed    Spouse name: Not on file   Number of children: Not on file   Years of education: Not on file   Highest education level: Bachelor's degree (e.g., BA, AB, BS)  Occupational History   Not on file  Tobacco Use   Smoking status: Never    Smokeless tobacco: Never  Vaping Use   Vaping Use: Never used  Substance and Sexual Activity   Alcohol use: Not Currently    Comment: none last 24 months   Drug use: No   Sexual activity: Not on file  Other Topics Concern   Not on file  Social History Narrative   Not on file   Social Determinants of Health   Financial Resource Strain: Not on file  Food Insecurity: Not on file  Transportation Needs: Not on file  Physical Activity: Not on file  Stress: Not on file  Social Connections: Not on file   Past Surgical History:  Procedure Laterality Date   ABDOMINAL HYSTERECTOMY     APPENDECTOMY     CATARACT EXTRACTION     CERVICAL LAMINECTOMY  07/21/1985   CESAREAN SECTION     COLONOSCOPY  07/21/2012   diverticulosis, [facility] Dr. [name]    COLONOSCOPY WITH PROPOFOL N/A 02/04/2019   Procedure: COLONOSCOPY WITH PROPOFOL;  Surgeon: [name], MD;  Location: [facility] ENDOSCOPY;  Service: Endoscopy;  Laterality: N/A;   COLONOSCOPY WITH PROPOFOL N/A 03/18/2021   Procedure: COLONOSCOPY WITH PROPOFOL;  Surgeon: Mia Creek,  Rossie Muskrat, MD;  Location: [facility] ENDOSCOPY;  Service: Endoscopy;  Laterality: N/A;  DM   DIAGNOSTIC LAPAROSCOPY     ESOPHAGOGASTRODUODENOSCOPY (EGD) WITH PROPOFOL N/A 02/04/2019   Procedure: ESOPHAGOGASTRODUODENOSCOPY (EGD) WITH PROPOFOL;  Surgeon: [name], MD;  Location: [facility] ENDOSCOPY;  Service: Endoscopy;  Laterality: N/A;   ESOPHAGOGASTRODUODENOSCOPY (EGD) WITH PROPOFOL N/A 03/18/2021   Procedure: ESOPHAGOGASTRODUODENOSCOPY (EGD) WITH PROPOFOL;  Surgeon: [name], MD;  Location: [facility] ENDOSCOPY;  Service: Endoscopy;  Laterality: N/A;   LOOP RECORDER INSERTION N/A 08/05/2021   Procedure: LOOP RECORDER INSERTION;  Surgeon: [name], MD;  Location: [facility] INVASIVE CV LAB;  Service: Cardiovascular;  Laterality: N/A;   SPINE SURGERY     TUBAL LIGATION     Past Medical History:  Diagnosis Date   Abnormal levels of other serum enzymes 07/17/2015    Arthritis    Constipation 07/11/2015   COVID-19 03/06/2021   Diabetes mellitus without complication (HCC)    Elevated liver enzymes    Fatty liver    Generalized abdominal pain 07/11/2015   Hyperlipidemia    Hypertension    Lifelong obesity    Macular degeneration    Morbid obesity due to excess calories (HCC) 07/11/2015   Other fatigue 07/11/2015   Sleep apnea    Spinal headache    There were no vitals taken for this visit.  Opioid Risk Score:   Fall Risk Score:  `1  Depression screen PHQ 2/9  Depression screen [facility] 2/9 08/16/2020 11/23/2019 12/16/2018 12/10/2017 08/31/2017 04/27/2017 12/17/2016  Decreased Interest 0 0 0 0 0 0 0  Down, Depressed, Hopeless 0 0 0 0 0 0 0  PHQ - 2 Score 0 0 0 0 0 0 0    Review of Systems  Musculoskeletal:  Positive for back pain and gait problem.  Neurological:  Positive for weakness (left hand) and headaches.  All other systems reviewed and are negative.     Objective:   Physical Exam Gen: no distress, normal appearing, BMI 34.01, BP 129/80, accompanied by son HEENT: oral mucosa pink and moist, NCAT Cardio: Reg rate Chest: normal effort, normal rate of breathing Abd: soft, non-distended Ext: no edema Psych: pleasant, normal affect, flat Skin: intact Neuro: Alert and oriented x3     Assessment & Plan:   1) CVA -continue therapies -would benefit from handicap placard to increase mobility in the community -reviewed all medications and provided necessary refills.  2) Diabetic peripheral neuropathy -Discussed Qutenza as an option for neuropathic pain control. Discussed that this is a capsaicin patch, stronger than capsaicin cream. Discussed that it is currently approved for diabetic peripheral neuropathy and post-herpetic neuralgia, but that it has also shown benefit in treating other forms of neuropathy. Provided patient with link to site to learn more about the patch: https://www.clark.biz/. Discussed that the patch would be placed in  office and benefits usually last 3 months. Discussed that unintended exposure to capsaicin can cause severe irritation of eyes, mucous membranes, respiratory tract, and skin, but that Qutenza is a local treatment and does not have the systemic side effects of other nerve medications. Discussed that there may be pain, itching, erythema, and decreased sensory function associated with the application of Qutenza. Side effects usually subside within 1 week. A cold pack of analgesic medications can help with these side effects. Blood pressure can also be increased due to pain associated with administration of the patch.  -Schedule for 4 patches next visit

## 2021-09-17 NOTE — Progress Notes (Signed)
Carelink Summary Report / Loop Recorder 

## 2021-10-02 ENCOUNTER — Other Ambulatory Visit (HOSPITAL_COMMUNITY): Payer: Self-pay

## 2021-10-08 ENCOUNTER — Other Ambulatory Visit: Payer: Self-pay | Admitting: Family Medicine

## 2021-10-10 NOTE — Telephone Encounter (Signed)
PCP is not a provider in this practice. ?

## 2021-10-14 ENCOUNTER — Ambulatory Visit (INDEPENDENT_AMBULATORY_CARE_PROVIDER_SITE_OTHER): Payer: Medicare HMO

## 2021-10-14 DIAGNOSIS — I639 Cerebral infarction, unspecified: Secondary | ICD-10-CM | POA: Diagnosis not present

## 2021-10-15 LAB — CUP PACEART REMOTE DEVICE CHECK
Date Time Interrogation Session: 20230327142216
Implantable Pulse Generator Implant Date: 20230116

## 2021-10-16 ENCOUNTER — Ambulatory Visit: Payer: Medicare HMO | Admitting: Adult Health

## 2021-10-16 ENCOUNTER — Encounter: Payer: Self-pay | Admitting: Adult Health

## 2021-10-16 VITALS — BP 138/85 | HR 79 | Ht 63.0 in | Wt 187.0 lb

## 2021-10-16 DIAGNOSIS — Z09 Encounter for follow-up examination after completed treatment for conditions other than malignant neoplasm: Secondary | ICD-10-CM

## 2021-10-16 DIAGNOSIS — M79642 Pain in left hand: Secondary | ICD-10-CM

## 2021-10-16 DIAGNOSIS — I63511 Cerebral infarction due to unspecified occlusion or stenosis of right middle cerebral artery: Secondary | ICD-10-CM | POA: Diagnosis not present

## 2021-10-16 NOTE — Progress Notes (Signed)
?[facility] ?[id number] ?[place]. [place] [address] ?[phone] ? ?     HOSPITAL FOLLOW UP NOTE ? ?[name] ?Date of Birth:  Dec 14, 1945 ?Medical Record Number:  [id number]  ? ?Reason for Referral:  hospital stroke follow up ? ? ? ?SUBJECTIVE: ? ? ?CHIEF COMPLAINT:  ?Chief Complaint  ?Patient presents with  ? Follow-up  ?  RM  3 with son Alyssa Mayo ?Pt is well and stable, states she has been getting stronger and making improvement since discharge.   ? Dementia  ? ? ?HPI:  ? ?[name] is a 76 y.o. female with history of diabetes, hypertension, hyperlipidemia, obesity, and sleep apnea who presented to [facility] ED on 08/01/2021 after being found down by her son. Her last know well was approximately 07/29/2021. She may have been suffering with some upper respiratory symptoms, took some cough medicine and has not been like herself since Tuesday (1/10).  She also had not taken her diabetes medications.  She was found to be mildly hyperglycemic with blood sugars in the upper 200s.  CTH showed infarct versus mass in right MCA territory.  She was transferred to [facility] ED for further evaluation.  MRI showed extensive restricted diffusion throughout much of the right MCA territory with associated changes of hemorrhagic transformation and localized cerebral edema and mass effect. Per Dr. Pearlean Brownie, felt embolic pattern secondary to unclear source although concerning for occult A fib. 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  CTA head/neck showed right M2 thrombus.  EF 60 to 65%.  LDL 93.  A1c 7.4.  Recommended placement of ILR prior to discharge to evaluate for A-fib.  On aspirin PTA, held during admission but restarted at discharge.  Initiated amlodipine and lisinopril for BP management.  Switched home dose simvastatin to atorvastatin 20 mg daily.  Resumed home diabetic medication regimen.  Other stroke risk factors include advanced  age, obesity family history of stroke and OSA on CPAP.  Per therapy recommendations, discharged to CIR on 1/18 for ongoing therapy needs. ? ? ?Today, 10/16/2021, patient being seen for initial hospital follow-up accompanied by her son, Alyssa Mayo.  Doing well since discharge reporting residual left arm weakness, imbalance, occasional slurred speech and mild short term memory impairment but overall improving. She also mentions persistent generalized cold sensation and fatigued quickly.  Currently ambulating with a cane, denies any recent falls.  Working with [facility] PT/OT/SLP. She is concerned that she may have injured her wrist when she fell prior to coming to ED, reports increased pain after working with OT. Currently using a wrist brace with some benefit.  Lives in own home, son currently living with her, does need assistance for ADLs and IADLs. Prior to stroke was completely independent as well as driving. She has not yet returned back to driving. Denies new stroke/TIA symptoms.  Compliant on aspirin and atorvastatin, denies side effects.  Blood pressure today 138/85.  Routinely monitors at home with home health therapy and usually overall stable. Glucose levels monitored at home which has been stable. Loop recorder has not shown atrial fibrillation thus far.  She has since been seen by PCP and endocrinology. Repeat lab work on 2/8 showed LDL 78 and A1c 7.5.  No further concerns at this time. ? ? ? ? ? ?PERTINENT IMAGING ? ?Per hospitalization 08/01/2021 ?Code Stroke- infarct versus mass in the right MCA territory. ?Repeat Head CT- Unchanged right MCA territory infarct with petechial hemorrhage. ?  CTA head & neck- R M2 thrombus ?MRI- extensive restricted diffusion throughout much of the right MCA territory with associated changes of hemorrhagic transformation and localized cerebral edema and mass-effect.. ?2D Echo ejection fraction 60 to 65%. ?LDL 93 ?HgbA1c 7.4 ? ? ? ?ROS:   ?14 system review of systems performed and negative  with exception of those listed in HPI ? ?PMH:  ?Past Medical History:  ?Diagnosis Date  ? Abnormal levels of other serum enzymes 07/17/2015  ? Arthritis   ? Constipation 07/11/2015  ? COVID-19 03/06/2021  ? Diabetes mellitus without complication (HCC)   ? Elevated liver enzymes   ? Fatty liver   ? Generalized abdominal pain 07/11/2015  ? Hyperlipidemia   ? Hypertension   ? Lifelong obesity   ? Macular degeneration   ? Morbid obesity due to excess calories (HCC) 07/11/2015  ? Other fatigue 07/11/2015  ? Sleep apnea   ? Spinal headache   ? ? ?PSH:  ?Past Surgical History:  ?Procedure Laterality Date  ? ABDOMINAL HYSTERECTOMY    ? APPENDECTOMY    ? CATARACT EXTRACTION    ? CERVICAL LAMINECTOMY  07/21/1985  ? CESAREAN SECTION    ? COLONOSCOPY  07/21/2012  ? diverticulosis, [facility] Dr. [name]   ? COLONOSCOPY WITH PROPOFOL N/A 02/04/2019  ? Procedure: COLONOSCOPY WITH PROPOFOL;  Surgeon: [name], MD;  Location: [facility] ENDOSCOPY;  Service: Endoscopy;  Laterality: N/A;  ? COLONOSCOPY WITH PROPOFOL N/A 03/18/2021  ? Procedure: COLONOSCOPY WITH PROPOFOL;  Surgeon: [name], MD;  Location: [facility] ENDOSCOPY;  Service: Endoscopy;  Laterality: N/A;  DM  ? DIAGNOSTIC LAPAROSCOPY    ? ESOPHAGOGASTRODUODENOSCOPY (EGD) WITH PROPOFOL N/A 02/04/2019  ? Procedure: ESOPHAGOGASTRODUODENOSCOPY (EGD) WITH PROPOFOL;  Surgeon: [name], MD;  Location: [facility] ENDOSCOPY;  Service: Endoscopy;  Laterality: N/A;  ? ESOPHAGOGASTRODUODENOSCOPY (EGD) WITH PROPOFOL N/A 03/18/2021  ? Procedure: ESOPHAGOGASTRODUODENOSCOPY (EGD) WITH PROPOFOL;  Surgeon: [name], MD;  Location: [facility] ENDOSCOPY;  Service: Endoscopy;  Laterality: N/A;  ? LOOP RECORDER INSERTION N/A 08/05/2021  ? Procedure: LOOP RECORDER INSERTION;  Surgeon: [name], MD;  Location: [facility] INVASIVE CV LAB;  Service: Cardiovascular;  Laterality: N/A;  ? SPINE SURGERY    ? TUBAL LIGATION    ? ? ?Social History:  ?Social History  ? ?Socioeconomic History  ?  Marital status: Widowed  ?  Spouse name: Not on file  ? Number of children: Not on file  ? Years of education: Not on file  ? Highest education level: Bachelor's degree (e.g., BA, AB, BS)  ?Occupational History  ? Not on file  ?Tobacco Use  ? Smoking status: Never  ? Smokeless tobacco: Never  ?Vaping Use  ? Vaping Use: Never used  ?Substance and Sexual Activity  ? Alcohol use: Not Currently  ?  Comment: none last 24 months  ? Drug use: No  ? Sexual activity: Not on file  ?Other Topics Concern  ? Not on file  ?Social History Narrative  ? Not on file  ? ?Social Determinants of Health  ? ?Financial Resource Strain: Not on file  ?Food Insecurity: Not on file  ?Transportation Needs: Not on file  ?Physical Activity: Not on file  ?Stress: Not on file  ?Social Connections: Not on file  ?Intimate Partner Violence: Not on file  ? ? ?Family History:  ?Family History  ?Problem Relation Age of Onset  ? Cancer Mother   ?     breast  ? Heart disease Mother   ?  Stroke Mother   ? Diabetes Mother   ? Colon cancer Mother   ? Cancer Father   ?     leukemia  ? Stroke Father   ? Leukemia Father   ? Ovarian cancer Sister   ? Diabetes Brother   ? Cancer Brother   ? Stroke Maternal Grandfather   ? Dementia Paternal Grandmother   ? COPD Neg Hx   ? Hypertension Neg Hx   ? ? ?Medications:   ?Current Outpatient Medications on File Prior to Visit  ?Medication Sig Dispense Refill  ? acetaminophen (TYLENOL) 325 MG tablet Take 2 tablets (650 mg total) by mouth every 4 (four) hours as needed for mild pain (or temp > 37.5 C (99.5 F)).    ? amLODipine (NORVASC) 2.5 MG tablet Take 1 tablet (2.5 mg total) by mouth daily. 30 tablet 0  ? aspirin EC 81 MG EC tablet Take 1 tablet (81 mg total) by mouth daily. Swallow whole. 30 tablet 11  ? atorvastatin (LIPITOR) 20 MG tablet Take 1 tablet (20 mg total) by mouth daily. 30 tablet 0  ? B Complex-C (B-COMPLEX WITH VITAMIN C) tablet Take 1 tablet by mouth daily. 30 tablet 0  ? celecoxib (CELEBREX) 200 MG capsule  Take 200 mg by mouth 2 (two) times daily.    ? Cholecalciferol 25 MCG (1000 UT) tablet Take 1 tablet (1,000 Units total) by mouth every morning. 30 tablet 0  ? dapagliflozin propanediol (FARXIGA) 10 M

## 2021-10-16 NOTE — Progress Notes (Signed)
I agree with the above plan 

## 2021-10-16 NOTE — Patient Instructions (Signed)
You will be called to schedule x-ray of your wrist to ensure no fractures  ? ?Continue working with therapies - once completed, if you would like to do additional outpatient therapies, please let me know ? ?If you continue to experience a generalized cold sensation, please follow up with your PCP ? ?Continue aspirin 81 mg daily  and atorvastatin 40 mg daily for secondary stroke prevention ? ?Cardiology will continue to monitor your loop recorder for possible atrial fibrillation ? ?Continue to follow up with PCP/endocrinology regarding cholesterol, blood pressure and diabetes management  ?Maintain strict control of hypertension with blood pressure goal below 130/90, diabetes with hemoglobin A1c goal below 7.0 % and cholesterol with LDL cholesterol (bad cholesterol) goal below 70 mg/dL.  ? ?Signs of a Stroke? Follow the BEFAST method:  ?Balance Watch for a sudden loss of balance, trouble with coordination or vertigo ?Eyes Is there a sudden loss of vision in one or both eyes? Or double vision?  ?Face: Ask the person to smile. Does one side of the face droop or is it numb?  ?Arms: Ask the person to raise both arms. Does one arm drift downward? Is there weakness or numbness of a leg? ?Speech: Ask the person to repeat a simple phrase. Does the speech sound slurred/strange? Is the person confused ? ?Time: If you observe any of these signs, call 911. ? ? ? ? ?Followup in the future with me in 4 months or call earlier if needed ? ? ? ? ? ? ?Thank you for coming to see [place] at [facility]. I hope we have been able to provide you high quality care today. ? ?You may receive a patient satisfaction survey over the next few weeks. We would appreciate your feedback and comments so that we may continue to improve ourselves and the health of our patients. ? ? ? ? ?Stroke Prevention ?Some medical conditions and lifestyle choices can lead to a higher risk for a stroke. You can help to prevent a stroke by eating healthy  foods and exercising. It also helps to not smoke and to manage any health problems you may have. ?How can this condition affect me? ?A stroke is an emergency. It should be treated right away. A stroke can lead to brain damage or threaten your life. There is a better chance of surviving and getting better after a stroke if you get medical help right away. ?What can increase my risk? ?The following medical conditions may increase your risk of a stroke: ?Diseases of the heart and blood vessels (cardiovascular disease). ?High blood pressure (hypertension). ?Diabetes. ?High cholesterol. ?Sickle cell disease. ?Problems with blood clotting. ?Being very overweight. ?Sleeping problems (obstructivesleep apnea). ?Other risk factors include: ?Being older than age 59. ?A history of blood clots, stroke, or mini-stroke (TIA). ?Race, ethnic background, or a family history of stroke. ?Smoking or using tobacco products. ?Taking birth control pills, especially if you smoke. ?Heavy alcohol and drug use. ?Not being active. ?What actions can I take to prevent this? ?Manage your health conditions ?High cholesterol. ?Eat a healthy diet. If this is not enough to manage your cholesterol, you may need to take medicines. ?Take medicines as told by your doctor. ?High blood pressure. ?Try to keep your blood pressure below 130/80. ?If your blood pressure cannot be managed through a healthy diet and regular exercise, you may need to take medicines. ?Take medicines as told by your doctor. ?Ask your doctor if you should check your blood pressure at home. ?Have  your blood pressure checked every year. ?Diabetes. ?Eat a healthy diet and get regular exercise. If your blood sugar (glucose) cannot be managed through diet and exercise, you may need to take medicines. ?Take medicines as told by your doctor. ?Talk to your doctor about getting checked for sleeping problems. Signs of a problem can include: ?Snoring a lot. ?Feeling very tired. ?Make sure that  you manage any other conditions you have. ?Nutrition ? ?Follow instructions from your doctor about what to eat or drink. You may be told to: ?Eat and drink fewer calories each day. ?Limit how much salt (sodium) you use to 1,500 milligrams (mg) each day. ?Use only healthy fats for cooking, such as olive oil, canola oil, and sunflower oil. ?Eat healthy foods. To do this: ?Choose foods that are high in fiber. These include whole grains, and fresh fruits and vegetables. ?Eat at least 5 servings of fruits and vegetables a day. Try to fill one-half of your plate with fruits and vegetables at each meal. ?Choose low-fat (lean) proteins. These include low-fat cuts of meat, chicken without skin, fish, tofu, beans, and nuts. ?Eat low-fat dairy products. ?Avoid foods that: ?Are high in salt. ?Have saturated fat. ?Have trans fat. ?Have cholesterol. ?Are processed or pre-made. ?Count how many carbohydrates you eat and drink each day. ?Lifestyle ?If you drink alcohol: ?Limit how much you have to: ?0-1 drink a day for women who are not pregnant. ?0-2 drinks a day for men. ?Know how much alcohol is in your drink. In the [place] one drink equals one 12 oz bottle of beer ( ), one 5 oz glass of wine ( ), or one 1? oz glass of hard liquor (94mL). ?Do not smoke or use any products that have nicotine or tobacco. If you need help quitting, ask your doctor. ?Avoid secondhand smoke. ?Do not use drugs. ?Activity ? ?Try to stay at a healthy weight. ?Get at least 30 minutes of exercise on most days, such as: ?Fast walking. ?Biking. ?Swimming. ?Medicines ?Take over-the-counter and prescription medicines only as told by your doctor. ?Avoid taking birth control pills. Talk to your doctor about the risks of taking birth control pills if: ?You are over 52 years old. ?You smoke. ?You get very bad headaches. ?You have had a blood clot. ?Where to find more information ?American Stroke Association: www.strokeassociation.org ?Get help right away  if: ?You or a loved one has any signs of a stroke. "BE FAST" is an easy way to remember the warning signs: ?B - Balance. Dizziness, sudden trouble walking, or loss of balance. ?E - Eyes. Trouble seeing or a change in how you see. ?F - Face. Sudden weakness or loss of feeling of the face. The face or eyelid may droop on one side. ?A - Arms. Weakness or loss of feeling in an arm. This happens all of a sudden and most often on one side of the body. ?S - Speech. Sudden trouble speaking, slurred speech, or trouble understanding what people say. ?T - Time. Time to call emergency services. Write down what time symptoms started. ?You or a loved one has other signs of a stroke, such as: ?A sudden, very bad headache with no known cause. ?Feeling like you may vomit (nausea). ?Vomiting. ?A seizure. ?These symptoms may be an emergency. Get help right away. Call your local emergency services (911 in the [place] ?Do not wait to see if the symptoms will go away. ?Do not drive yourself to the hospital. ?Summary ?You can help to prevent a stroke  by eating healthy, exercising, and not smoking. It also helps to manage any health problems you have. ?Do not smoke or use any products that contain nicotine or tobacco. ?Get help right away if you or a loved one has any signs of a stroke. ?This information is not intended to replace advice given to you by your health care provider. Make sure you discuss any questions you have with your health care provider. ?Document Revised: 02/06/2020 Document Reviewed: 02/06/2020 ?Elsevier Patient Education ? 2022 Elsevier Inc. ? ? ?

## 2021-10-23 NOTE — Progress Notes (Signed)
Carelink Summary Report / Loop Recorder 

## 2021-10-25 ENCOUNTER — Other Ambulatory Visit (HOSPITAL_COMMUNITY): Payer: Self-pay

## 2021-10-28 ENCOUNTER — Other Ambulatory Visit (HOSPITAL_COMMUNITY): Payer: Self-pay

## 2021-11-18 ENCOUNTER — Ambulatory Visit (INDEPENDENT_AMBULATORY_CARE_PROVIDER_SITE_OTHER): Payer: Medicare HMO

## 2021-11-18 DIAGNOSIS — I639 Cerebral infarction, unspecified: Secondary | ICD-10-CM | POA: Diagnosis not present

## 2021-11-18 LAB — CUP PACEART REMOTE DEVICE CHECK
Date Time Interrogation Session: 20230429142533
Implantable Pulse Generator Implant Date: 20230116

## 2021-12-03 NOTE — Progress Notes (Signed)
Carelink Summary Report / Loop Recorder 

## 2021-12-22 LAB — CUP PACEART REMOTE DEVICE CHECK
Date Time Interrogation Session: 20230601142418
Implantable Pulse Generator Implant Date: 20230116

## 2021-12-23 ENCOUNTER — Ambulatory Visit (INDEPENDENT_AMBULATORY_CARE_PROVIDER_SITE_OTHER): Payer: Medicare HMO

## 2021-12-23 DIAGNOSIS — I639 Cerebral infarction, unspecified: Secondary | ICD-10-CM

## 2022-01-08 NOTE — Progress Notes (Signed)
Carelink Summary Report / Loop Recorder 

## 2022-01-26 LAB — CUP PACEART REMOTE DEVICE CHECK
Date Time Interrogation Session: 20230704142103
Implantable Pulse Generator Implant Date: 20230116

## 2022-01-27 ENCOUNTER — Ambulatory Visit (INDEPENDENT_AMBULATORY_CARE_PROVIDER_SITE_OTHER): Payer: Medicare HMO

## 2022-01-27 DIAGNOSIS — I639 Cerebral infarction, unspecified: Secondary | ICD-10-CM | POA: Diagnosis not present

## 2022-02-19 NOTE — Progress Notes (Unsigned)
[facility] [address] [place]. [place] [address] [phone]       STROKE FOLLOW UP NOTE  [name] Date of Birth:  1946-04-24 Medical Record Number:  [id number]   Reason for Referral: stroke follow up    SUBJECTIVE:   CHIEF COMPLAINT:  No chief complaint on file.   HPI:   Update 02/20/2022 JM: Patient returns for 51-month stroke follow-up.  Has been stable without new stroke/TIA symptoms.  Reports residual ***.   Compliant on aspirin atorvastatin, denies side effects Blood pressure today *** Loop recorder has not shown atrial fibrillation thus far Routinely monitored by PCP Dr. Burnett Sheng - plans on repeat labs 03/2022     History provided for reference purposes only Initial visit 10/16/2021 JM: Patient being seen for initial hospital follow-up accompanied by her son, Alyssa Mayo.  Doing well since discharge reporting residual left arm weakness, imbalance, occasional slurred speech and mild short term memory impairment but overall improving. She also mentions persistent generalized cold sensation and fatigued quickly.  Currently ambulating with a cane, denies any recent falls.  Working with [facility] PT/OT/SLP. She is concerned that she may have injured her wrist when she fell prior to coming to ED, reports increased pain after working with OT. Currently using a wrist brace with some benefit.  Lives in own home, son currently living with her, does need assistance for ADLs and IADLs. Prior to stroke was completely independent as well as driving. She has not yet returned back to driving. Denies new stroke/TIA symptoms.  Compliant on aspirin and atorvastatin, denies side effects.  Blood pressure today 138/85.  Routinely monitors at home with home health therapy and usually overall stable. Glucose levels monitored at home which has been stable. Loop recorder has not shown atrial fibrillation thus far.  She has since been seen by PCP and endocrinology. Repeat lab work on  2/8 showed LDL 78 and A1c 7.5.  No further concerns at this time.   Stroke admission 08/01/2021 [name] is a 76 y.o. female with history of diabetes, hypertension, hyperlipidemia, obesity, and sleep apnea who presented to [facility] ED on 08/01/2021 after being found down by her son. Her last know well was approximately 07/29/2021. She may have been suffering with some upper respiratory symptoms, took some cough medicine and has not been like herself since Tuesday (1/10).  She also had not taken her diabetes medications.  She was found to be mildly hyperglycemic with blood sugars in the upper 200s.  CTH showed infarct versus mass in right MCA territory.  She was transferred to [facility] ED for further evaluation.  MRI showed extensive restricted diffusion throughout much of the right MCA territory with associated changes of hemorrhagic transformation and localized cerebral edema and mass effect. Per Dr. Pearlean Brownie, felt embolic pattern secondary to unclear source although concerning for occult A fib. 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  CTA head/neck showed right M2 thrombus.  EF 60 to 65%.  LDL 93.  A1c 7.4.  Recommended placement of ILR prior to discharge to evaluate for A-fib.  On aspirin PTA, held during admission but restarted at discharge.  Initiated amlodipine and lisinopril for BP management.  Switched home dose simvastatin to atorvastatin 20 mg daily.  Resumed home diabetic medication regimen.  Other stroke risk factors include advanced age, obesity family history of stroke and OSA on CPAP.  Per therapy recommendations, discharged to CIR on 1/18 for ongoing  therapy needs.     PERTINENT IMAGING  Per hospitalization 08/01/2021 Code Stroke- infarct versus mass in the right MCA territory. Repeat Head CT- Unchanged right MCA territory infarct with petechial hemorrhage. CTA head & neck- R M2 thrombus MRI- extensive restricted diffusion  throughout much of the right MCA territory with associated changes of hemorrhagic transformation and localized cerebral edema and mass-effect.. 2D Echo ejection fraction 60 to 65%. LDL 93 HgbA1c 7.4    ROS:   14 system review of systems performed and negative with exception of those listed in HPI  PMH:  Past Medical History:  Diagnosis Date   Abnormal levels of other serum enzymes 07/17/2015   Arthritis    Constipation 07/11/2015   COVID-19 03/06/2021   Diabetes mellitus without complication (HCC)    Elevated liver enzymes    Fatty liver    Generalized abdominal pain 07/11/2015   Hyperlipidemia    Hypertension    Lifelong obesity    Macular degeneration    Morbid obesity due to excess calories (HCC) 07/11/2015   Other fatigue 07/11/2015   Sleep apnea    Spinal headache     PSH:  Past Surgical History:  Procedure Laterality Date   ABDOMINAL HYSTERECTOMY     APPENDECTOMY     CATARACT EXTRACTION     CERVICAL LAMINECTOMY  07/21/1985   CESAREAN SECTION     COLONOSCOPY  07/21/2012   diverticulosis, [facility] Dr. [name]    COLONOSCOPY WITH PROPOFOL N/A 02/04/2019   Procedure: COLONOSCOPY WITH PROPOFOL;  Surgeon: [name], MD;  Location: [facility] ENDOSCOPY;  Service: Endoscopy;  Laterality: N/A;   COLONOSCOPY WITH PROPOFOL N/A 03/18/2021   Procedure: COLONOSCOPY WITH PROPOFOL;  Surgeon: [name], MD;  Location: [facility] ENDOSCOPY;  Service: Endoscopy;  Laterality: N/A;  DM   DIAGNOSTIC LAPAROSCOPY     ESOPHAGOGASTRODUODENOSCOPY (EGD) WITH PROPOFOL N/A 02/04/2019   Procedure: ESOPHAGOGASTRODUODENOSCOPY (EGD) WITH PROPOFOL;  Surgeon: [name], MD;  Location: [facility] ENDOSCOPY;  Service: Endoscopy;  Laterality: N/A;   ESOPHAGOGASTRODUODENOSCOPY (EGD) WITH PROPOFOL N/A 03/18/2021   Procedure: ESOPHAGOGASTRODUODENOSCOPY (EGD) WITH PROPOFOL;  Surgeon: [name], MD;  Location: [facility] ENDOSCOPY;  Service: Endoscopy;  Laterality: N/A;   LOOP RECORDER INSERTION  N/A 08/05/2021   Procedure: LOOP RECORDER INSERTION;  Surgeon: [name], MD;  Location: [facility] INVASIVE CV LAB;  Service: Cardiovascular;  Laterality: N/A;   SPINE SURGERY     TUBAL LIGATION      Social History:  Social History   Socioeconomic History   Marital status: Widowed    Spouse name: Not on file   Number of children: Not on file   Years of education: Not on file   Highest education level: Bachelor's degree (e.g., BA, AB, BS)  Occupational History   Not on file  Tobacco Use   Smoking status: Never   Smokeless tobacco: Never  Vaping Use   Vaping Use: Never used  Substance and Sexual Activity   Alcohol use: Not Currently    Comment: none last 24 months   Drug use: No   Sexual activity: Not on file  Other Topics Concern   Not on file  Social History Narrative   Not on file   Social Determinants of Health   Financial Resource Strain: Low Risk  (12/10/2017)   Overall Financial Resource Strain (CARDIA)    Difficulty of Paying Living Expenses: Not hard at all  Food Insecurity: No Food Insecurity (12/10/2017)   Hunger Vital Sign    Worried  About Running Out of Food in the Last Year: Never true    Ran Out of Food in the Last Year: Never true  Transportation Needs: No Transportation Needs (12/10/2017)   PRAPARE - Administrator, Civil Service (Medical): No    Lack of Transportation (Non-Medical): No  Physical Activity: Inactive (12/10/2017)   Exercise Vital Sign    Days of Exercise per Week: 0 days    Minutes of Exercise per Session: 0 min  Stress: No Stress Concern Present (12/10/2017)   [employer] of Occupational Health - Occupational Stress Questionnaire    Feeling of Stress : Not at all  Social Connections: Socially Integrated (12/10/2017)   Social Connection and Isolation Panel [NHANES]    Frequency of Communication with Friends and Family: More than three times a week    Frequency of Social Gatherings with Friends and Family: More than three  times a week    Attends Religious Services: More than 4 times per year    Active Member of [employer] or Organizations: Yes    Attends Engineer, structural: More than 4 times per year    Marital Status: Married  Catering manager Violence: Not At Risk (12/10/2017)   Humiliation, Afraid, Rape, and Kick questionnaire    Fear of Current or Ex-Partner: No    Emotionally Abused: No    Physically Abused: No    Sexually Abused: No    Family History:  Family History  Problem Relation Age of Onset   Cancer Mother        breast   Heart disease Mother    Stroke Mother    Diabetes Mother    Colon cancer Mother    Cancer Father        leukemia   Stroke Father    Leukemia Father    Ovarian cancer Sister    Diabetes Brother    Cancer Brother    Stroke Maternal Grandfather    Dementia Paternal Grandmother    COPD Neg Hx    Hypertension Neg Hx     Medications:   Current Outpatient Medications on File Prior to Visit  Medication Sig Dispense Refill   acetaminophen (TYLENOL) 325 MG tablet Take 2 tablets (650 mg total) by mouth every 4 (four) hours as needed for mild pain (or temp > 37.5 C (99.5 F)).     amLODipine (NORVASC) 2.5 MG tablet Take 1 tablet (2.5 mg total) by mouth daily. 30 tablet 0   aspirin EC 81 MG EC tablet Take 1 tablet (81 mg total) by mouth daily. Swallow whole. 30 tablet 11   atorvastatin (LIPITOR) 20 MG tablet Take 1 tablet (20 mg total) by mouth daily. 30 tablet 0   B Complex-C (B-COMPLEX WITH VITAMIN C) tablet Take 1 tablet by mouth daily. 30 tablet 0   celecoxib (CELEBREX) 200 MG capsule Take 200 mg by mouth 2 (two) times daily.     Cholecalciferol 25 MCG (1000 UT) tablet Take 1 tablet (1,000 Units total) by mouth every morning. 30 tablet 0   dapagliflozin propanediol (FARXIGA) 10 MG TABS tablet Take 10 mg by mouth daily. (Patient taking differently: Take 10 mg by mouth every morning.) 90 tablet 1   gabapentin (NEURONTIN) 100 MG capsule Take 100 mg by mouth at  bedtime. 2 tab     glipiZIDE (GLUCOTROL XL) 5 MG 24 hr tablet Take 1 tablet (5 mg total) by mouth 2 (two) times daily with a meal. 60 tablet 0   magnesium  oxide (MAG-OX) 400 MG tablet Take 1 tablet (400 mg total) by mouth at bedtime. 30 tablet 0   metFORMIN (GLUCOPHAGE) 500 MG tablet Take 2 tablets (1,000 mg total) by mouth 2 (two) times daily. 120 tablet 0   Multiple Vitamins-Minerals (PRESERVISION AREDS 2) CAPS Take 1 capsule by mouth 2 (two) times daily.     pantoprazole (PROTONIX) 40 MG tablet Take 1 tablet (40 mg total) by mouth daily. 30 tablet 0   Vitamin D, Ergocalciferol, (DRISDOL) 1.25 MG (50000 UNIT) CAPS capsule Take 1 capsule (50,000 Units total) by mouth every 7 (seven) days. 5 capsule 0   No current facility-administered medications on file prior to visit.    Allergies:   Allergies  Allergen Reactions   Codeine Other (See Comments)    Makes her very loopy and groggy   Neosporin [Neomycin-Bacitracin Zn-Polymyx] Hives   Ozempic [Semaglutide] Hives   Polymyxin B Other (See Comments)    Eyes redness   Sulfa Antibiotics Itching      OBJECTIVE:  Physical Exam  There were no vitals filed for this visit.  There is no height or weight on file to calculate BMI. No results found.   General: well developed, well nourished, pleasant elderly Caucasian female, seated, in no evident distress Head: head normocephalic and atraumatic.   Neck: supple with no carotid or supraclavicular bruits Cardiovascular: regular rate and rhythm, no murmurs Musculoskeletal: limited left wrist active and passive range of motion; wearing wrist splint.  Skin:  no rash/petichiae Vascular:  Normal pulses all extremities   Neurologic Exam Mental Status: Awake and fully alert. mild dysarthria.  No evidence of aphasia.  Oriented to place and time. Recent memory mildly impaired and remote memory intact. Attention span, concentration and fund of knowledge appropriate during visit. Mood and affect  appropriate.  Cranial Nerves: Pupils equal, briskly reactive to light. Extraocular movements full without nystagmus. Visual fields full to confrontation. Hearing intact. Facial sensation intact.  Left nasolabial fold flattening.  Tongue, palate moves normally and symmetrically.  Motor: Normal bulk and tone. Normal strength in all tested extremity muscles except slight LUE pronator drift, decreased left hand grip strength and decreased hand dexterity and slightly increased tone proximal LUE Sensory.: intact to touch , pinprick , position and vibratory sensation.  Coordination: Rapid alternating movements normal in all extremities except left hand. Finger-to-nose performed accurately RUE and heel-to-shin performed accurately bilaterally. Gait and Station: Arises from chair with mild difficulty. Stance is normal.  Wide-based gait with slow cautious steps and mild imbalance with use of cane.  Tandem walk and heel toe not attempted.  Reflexes: 1+ and symmetric. Toes downgoing.          ASSESSMENT: Alyssa Mayo is a 76 y.o. 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 Vascular risk factors include HTN, HLD, DM, advanced age, obesity and OSA.      PLAN:  R MCA stroke with HT :  Residual deficit: LUE weakness, gait impairment, mild dysarthria and mild short-term memory loss.  Continue working with home health therapies and possibly transition to outpatient therapies once completed.  Continued use of cane at all times unless otherwise instructed. Discussed cold sensation complaints - as generalized issue and no specifically on left side, lower suspicion stroke related.  Advised to further discuss with PCP Loop recorder has not shown atrial fibrillation thus far.   Continue aspirin 81  mg daily  and atorvastatin 20 mg daily for secondary stroke prevention.  Discussed  secondary stroke prevention measures and importance of close PCP follow up for aggressive stroke risk factor management including BP goal<130/90, HLD with LDL goal<70 and DM with A1c.<7.  Stroke labs 08/2021: A1c 7.5, calc LDL 78 I have gone over the pathophysiology of stroke, warning signs and symptoms, risk factors and their management in some detail with instructions to go to the closest emergency room for symptoms of concern. Left wrist pain: traumatic post fall. Mild swelling, limited ROM, increased pain with finger and wrist movement. Difficult to determine if from injury or post stroke therefore will obtain x-ray to rule out any abnormalities. Discussed use of brace at this time.       Follow up in 4 months or call earlier if needed   CC:  PCP: [name], MD    I spent 59 minutes of face-to-face and non-face-to-face time with patient and son.  This included previsit chart review, lab review, study review, order entry, electronic health record documentation, patient and son education regarding prior stroke including etiology, secondary stroke prevention measures and importance of managing stroke risk factors, residual deficits and typical recovery time, wrist pain and further evaluation, and answered all other questions to patient and sons satisfaction   [name], AGNP-BC  [facility] [address] [place], [place] [address]  Phone [phone] Fax [phone] Note: This document was prepared with digital dictation and possible smart phrase technology. Any transcriptional errors that result from this process are unintentional.

## 2022-02-20 ENCOUNTER — Ambulatory Visit: Payer: Medicare HMO | Admitting: Adult Health

## 2022-02-20 ENCOUNTER — Encounter: Payer: Self-pay | Admitting: Adult Health

## 2022-02-20 VITALS — BP 148/60 | HR 71 | Ht 62.0 in | Wt 185.2 lb

## 2022-02-20 DIAGNOSIS — G4733 Obstructive sleep apnea (adult) (pediatric): Secondary | ICD-10-CM

## 2022-02-20 DIAGNOSIS — I63511 Cerebral infarction due to unspecified occlusion or stenosis of right middle cerebral artery: Secondary | ICD-10-CM

## 2022-02-20 NOTE — Patient Instructions (Signed)
Ensure you are doing exercises at home as advised by therapies. If you would like to do additional therapies outpatient (going to an office), please let me know and I will place orders!  Continue aspirin 81 mg daily  and atorvastatin for secondary stroke prevention  Please reach out to your CPAP supply company to discuss issues with your CPAP machine. It is very important to use your CPAP nighty as untreated sleep apnea can contribute to memory issues as well as increase risk of further strokes  Loop recorder will continue to be monitored by cardiology -has not shown atrial fibrillation thus far  Continue to follow up with PCP regarding blood pressure and cholesterol management  Maintain strict control of hypertension with blood pressure goal below 130/90 and cholesterol with LDL cholesterol (bad cholesterol) goal below 70 mg/dL.   Signs of a Stroke? Follow the BEFAST method:  Balance Watch for a sudden loss of balance, trouble with coordination or vertigo Eyes Is there a sudden loss of vision in one or both eyes? Or double vision?  Face: Ask the person to smile. Does one side of the face droop or is it numb?  Arms: Ask the person to raise both arms. Does one arm drift downward? Is there weakness or numbness of a leg? Speech: Ask the person to repeat a simple phrase. Does the speech sound slurred/strange? Is the person confused ? Time: If you observe any of these signs, call 911.    Followup in the future with me in 6 months or call earlier if needed       Thank you for coming to see [place] at [facility]. I hope we have been able to provide you high quality care today.  You may receive a patient satisfaction survey over the next few weeks. We would appreciate your feedback and comments so that we may continue to improve ourselves and the health of our patients.

## 2022-02-26 ENCOUNTER — Telehealth: Payer: Self-pay | Admitting: Adult Health

## 2022-02-26 DIAGNOSIS — R29898 Other symptoms and signs involving the musculoskeletal system: Secondary | ICD-10-CM

## 2022-02-26 DIAGNOSIS — I69398 Other sequelae of cerebral infarction: Secondary | ICD-10-CM

## 2022-02-26 DIAGNOSIS — I69319 Unspecified symptoms and signs involving cognitive functions following cerebral infarction: Secondary | ICD-10-CM

## 2022-02-26 DIAGNOSIS — I63511 Cerebral infarction due to unspecified occlusion or stenosis of right middle cerebral artery: Secondary | ICD-10-CM

## 2022-02-26 LAB — CUP PACEART REMOTE DEVICE CHECK
Date Time Interrogation Session: 20230806142346
Implantable Pulse Generator Implant Date: 20230116

## 2022-02-26 NOTE — Telephone Encounter (Signed)
I am more than happy to place orders to start speech therapy. Per her demographics, resides in [place] - would she like to go to [facility] [address] for therapy? Also, please see if she would also like to do PT/OT for continued gait impairment and arm weakness. Thank you.

## 2022-02-26 NOTE — Telephone Encounter (Signed)
Called son, [name] and advised of NP's recommendations. He stated [facility] would be best for location, and she would like PT/OT. He stated the individual who did her driving test told them she would benefit from cognitive therapy as well. I advised that can be done by speech therapist. I'll let Shanda Bumps know of their requests. He verbalized understanding, appreciation.

## 2022-02-26 NOTE — Telephone Encounter (Signed)
Orders placed to [facility] for PT/OT/SLP. Thank you.

## 2022-02-26 NOTE — Addendum Note (Signed)
Addended by: [name] L on: 02/26/2022 04:22 PM   Modules accepted: Orders

## 2022-02-26 NOTE — Telephone Encounter (Signed)
Son requesting Cognitive & speech therapy for pt, he said they were previously told it this were later requested just to call and make request.

## 2022-02-27 NOTE — Progress Notes (Signed)
Carelink Summary Report / Loop Recorder 

## 2022-03-03 ENCOUNTER — Ambulatory Visit: Payer: Medicare HMO

## 2022-03-04 ENCOUNTER — Ambulatory Visit: Payer: Medicare HMO | Attending: Adult Health | Admitting: Speech Pathology

## 2022-03-04 ENCOUNTER — Encounter: Payer: Self-pay | Admitting: Occupational Therapy

## 2022-03-04 ENCOUNTER — Ambulatory Visit: Payer: Medicare HMO | Admitting: Occupational Therapy

## 2022-03-04 DIAGNOSIS — R278 Other lack of coordination: Secondary | ICD-10-CM | POA: Diagnosis present

## 2022-03-04 DIAGNOSIS — M6281 Muscle weakness (generalized): Secondary | ICD-10-CM

## 2022-03-04 DIAGNOSIS — R41841 Cognitive communication deficit: Secondary | ICD-10-CM | POA: Insufficient documentation

## 2022-03-04 DIAGNOSIS — I63511 Cerebral infarction due to unspecified occlusion or stenosis of right middle cerebral artery: Secondary | ICD-10-CM | POA: Diagnosis present

## 2022-03-04 NOTE — Therapy (Signed)
OUTPATIENT OCCUPATIONAL THERAPY NEURO EVALUATION  Patient Name: Alyssa Mayo MRN: [id number] DOB:04-18-1946, 76 y.o., female Today's Date: 03/04/2022  PCP: [name], MD REFERRING PROVIDER: [name], NP   OT End of Session - 03/04/22 1607     Visit Number 1    Number of Visits 24    Date for OT Re-Evaluation 05/27/22    Authorization Type Progress reporting period starting 03/04/2022    OT Start Time 1605    OT Stop Time 1700    OT Time Calculation (min) 55 min    Activity Tolerance Patient tolerated treatment well    Behavior During Therapy [facility] for tasks assessed/performed             Past Medical History:  Diagnosis Date   Abnormal levels of other serum enzymes 07/17/2015   Arthritis    Constipation 07/11/2015   COVID-19 03/06/2021   Diabetes mellitus without complication (HCC)    Elevated liver enzymes    Fatty liver    Generalized abdominal pain 07/11/2015   Hyperlipidemia    Hypertension    Lifelong obesity    Macular degeneration    Morbid obesity due to excess calories (HCC) 07/11/2015   Other fatigue 07/11/2015   Sleep apnea    Spinal headache    Past Surgical History:  Procedure Laterality Date   ABDOMINAL HYSTERECTOMY     APPENDECTOMY     CATARACT EXTRACTION     CERVICAL LAMINECTOMY  07/21/1985   CESAREAN SECTION     COLONOSCOPY  07/21/2012   diverticulosis, [facility] Dr. [name]    COLONOSCOPY WITH PROPOFOL N/A 02/04/2019   Procedure: COLONOSCOPY WITH PROPOFOL;  Surgeon: [name], MD;  Location: [facility] ENDOSCOPY;  Service: Endoscopy;  Laterality: N/A;   COLONOSCOPY WITH PROPOFOL N/A 03/18/2021   Procedure: COLONOSCOPY WITH PROPOFOL;  Surgeon: [name], MD;  Location: [facility] ENDOSCOPY;  Service: Endoscopy;  Laterality: N/A;  DM   DIAGNOSTIC LAPAROSCOPY     ESOPHAGOGASTRODUODENOSCOPY (EGD) WITH PROPOFOL N/A 02/04/2019   Procedure: ESOPHAGOGASTRODUODENOSCOPY (EGD) WITH PROPOFOL;  Surgeon: [name], MD;  Location:  [facility] ENDOSCOPY;  Service: Endoscopy;  Laterality: N/A;   ESOPHAGOGASTRODUODENOSCOPY (EGD) WITH PROPOFOL N/A 03/18/2021   Procedure: ESOPHAGOGASTRODUODENOSCOPY (EGD) WITH PROPOFOL;  Surgeon: [name], MD;  Location: [facility] ENDOSCOPY;  Service: Endoscopy;  Laterality: N/A;   LOOP RECORDER INSERTION N/A 08/05/2021   Procedure: LOOP RECORDER INSERTION;  Surgeon: [name], MD;  Location: [facility] INVASIVE CV LAB;  Service: Cardiovascular;  Laterality: N/A;   SPINE SURGERY     TUBAL LIGATION     Patient Active Problem List   Diagnosis Date Noted   Cerebral edema (HCC) 08/07/2021   OSA (obstructive sleep apnea) 08/07/2021   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   CVA (cerebral vascular accident) (HCC) 08/01/2021   GERD (gastroesophageal reflux disease) 07/20/2019   Advanced care planning/counseling discussion 12/17/2016   Skin lesions, generalized 11/13/2016   Chronic right hip pain 06/17/2016   Vitamin D deficiency 09/26/2015   Diastasis recti 08/08/2015   Elevated serum GGT level 07/24/2015   Essential hypertension 07/17/2015   Fatty liver 07/17/2015   Elevated alkaline phosphatase level 07/17/2015   Elevated serum glutamic pyruvic transaminase (SGPT) level 07/17/2015   Abnormal finding on EKG 07/11/2015   Morbid obesity (HCC) 07/11/2015   Colon, diverticulosis 07/11/2015   Abdominal wall hernia 07/11/2015   DM (diabetes mellitus), type 2 with neurological complications (HCC)    Hyperlipidemia     ONSET DATE:  08/01/2021  REFERRING DIAG: CVA  THERAPY DIAG:  Muscle weakness (generalized)  Other lack of coordination  Rationale for Evaluation and Treatment Rehabilitation  SUBJECTIVE:   SUBJECTIVE STATEMENT:  Pt accompanied by: self  PERTINENT HISTORY:  Pt. Is a 76 y.o. female  presents with a diagnosis of RMCA CVA infarction with hemorrhagic transformation. Pt. Attended inpatient rehab from 08/07/2021-08/22/2021. Pt. received Home health therapy services  this past spring.  Pt. Has recently had an assessment through driver rehabilitation services at [facility] with recommendations for referrals for  outpatient OT/PT, and ST services. Pt. PMHx includes: HTN, Hyperlipidemia, Macular degeneration, DM, and Obesity.  PRECAUTIONS: None  WEIGHT BEARING RESTRICTIONS No  PAIN:  Are you having pain? No  FALLS: Has patient fallen in last 6 months? Yes, 1 fall out of bed at the time of the stroke, and one fall out of bed just after returning home from the hospital.   LIVING ENVIRONMENT: Lives with: lives with their family  Son  [name] Lives in: House/apartment Main living are on one floor Stairs:  2 steps to enter Has following equipment at home: Single point cane, Wheelchair (manual), shower chair, Grab bars, bed rail, and rubber mat.  PLOF: Independent  PATIENT GOALS: To be able to Drive, and cook again  OBJECTIVE:   HAND DOMINANCE: Right   ADLs: Overall ADLs:  Transfers/ambulation related to ADLs: Independent Eating: Independent Grooming: Pt. Fatigues with sustained BUE's in elevation for haircare. Especially with blow drying hair. UB Dressing: Independent pullover shirt, independent now with fastening a bra LB Dressing: Independent donning pants socks, and slide on shoes Toileting: Independent Bathing: Independent Tub Shower transfers: Walk-in shower Independent now    IADLs: Shopping: Needs to be accompanied to the grocery store. Light housekeeping: Does own laundry, laundry,  Meal Prep:  Difficulty with cooking Community mobility: Relies on son, and friend Medication management: Son sets up Mining engineer management: Manages monthly bills independently. Handwriting: No change from baseline  MOBILITY STATUS: Hx of falls out of bed   ACTIVITY TOLERANCE: Activity tolerance:  10-20 min. Before rest break  FUNCTIONAL OUTCOME MEASURES: FOTO: 57  TR score: 62  UPPER EXTREMITY ROM     Active ROM Right eval Left eval  Shoulder  flexion [facility] [phone])  Shoulder abduction [facility] [phone])  Shoulder adduction    Shoulder extension    Shoulder internal rotation    Shoulder external rotation    Elbow flexion [facility] WFL  Elbow extension [facility] WFL  Wrist flexion    Wrist extension WFL 44(60  Wrist ulnar deviation WFL 32(60)  Wrist radial deviation    Wrist pronation    Wrist supination    (Blank rows = not tested)   UPPER EXTREMITY MMT:     MMT Right eval Left eval  Shoulder flexion 4+/5 3-/5  Shoulder abduction 4+/5 3-/5  Shoulder adduction    Shoulder extension    Shoulder internal rotation    Shoulder external rotation    Middle trapezius    Lower trapezius    Elbow flexion 4+/5 4-/5  Elbow extension 4+/5 4-/5  Wrist flexion 4+/5 3-/5  Wrist extension 4+/5 3/5  Wrist ulnar deviation    Wrist radial deviation    Wrist pronation    Wrist supination    (Blank rows = not tested)  HAND FUNCTION: Grip strength: Right: 30 lbs; Left: 13 lbs, Lateral pinch: Right: 15 lbs, Left: 8 lbs, and 3 point pinch: Right: 13 lbs, Left: 7 lbs  COORDINATION: 9 Hole Peg  test: Right: 26  sec; Left: 1 min. & 9 sec  SENSATION: Light touch: WFL Proprioception: WFL  COGNITION: Overall cognitive status: Within functional limits for tasks assessed  VISION: Subjective report: Glasses. Just received a new prescription to increase bifocal strength Baseline vision: Macular Degeneration Visual history: macular degeneration  VISION ASSESSMENT: To be further assessed in functional context  Left sided Awareness    PERCEPTION: Limited left sided awareness   TODAY'S TREATMENT:  Pt. Presented to the OT clinic for an initial evaluation   PATIENT EDUCATION: Education details: OT services POC, and goals Person educated: Patient Education method: Medical illustrator Education comprehension: returned demonstration   HOME EXERCISE PROGRAM:  Continue with an ongoing assessment of HEP needs    GOALS: Goals  reviewed with patient? Yes   SHORT TERM GOALS: Target date:  04/15/2022      Pt. Will improve FOTO score by 2 points to reflect improved pt. perceived functional performance Baseline: FOTO: 57, TR score: 62 Goal status: INITIAL  LONG TERM GOALS: Target date: 05/27/2022    Pt. Will improve left UE strength by 2 mm grades to assist with repositioning her grandson on her lap. Baseline: Eval: left shoulder flexion: 3-/5, abduction 3-/5,  elbow flex/ext. 4-/5, wrist extension 3/5, wrist flexion 3-/5  Goal status: INITIAL  2.  Pt. will improve left grip strength by 5# to be able to independently hold and use a blow dryer, and brush for hair care. Baseline: Eval: Left grip strength 13#. Pt. Has difficulty performing hair care, holding and using a brush, and blow dryer. Goal status: INITIAL  3.  Pt. Will improve left lateral pinch strength by 3# to be able to independently cut meat. Baseline: Eval: Left 8, Pt. Has difficulty cutting meat Goal status: INITIAL  4.  Pt. Will improve Left hand [facility] skills to be able to be able to independently, and efficiently manipulate small objects during ADL tasks. Baseline: Eval: left: 1 min. 9 sec. Goal status: INITIAL  5.  Pt. Will perform light meal preparation with modified independence Baseline: Eval: Pt. Reports having difficulty performing light  meal preparation, and cooking tasks. Goal status: INITIAL  ASSESSMENT:  CLINICAL IMPRESSION:  Patient is a 76 y.o. female who was seen today for occupational therapy evaluation following a CVA. Pt. Presents with decreased ROM in left shoulder flexion, decreased LUE strength, limited grip strength, pinch strength, and impaired Left hand FMC, speed, and dexterity. Pt. presents with limited awareness of when she has dropped items from her left hand during daily tasks. These LUE limitations affect her ability to perform ADL, and IADL tasks efficiently, and affectively. Pt. Will benefit from OT services to work  towards improving LUE functioning in order for her to be able to hold her grandchild, blow dry and brush her hair, cut meat, perform meal preparation tasks, and manipulate small objects during ADL, and IADL tasks.   PERFORMANCE DEFICITS in functional skills including ADLs, IADLs, coordination, dexterity, sensation, ROM, strength, FMC, decreased knowledge of use of DME, vision, and UE functional use, cognitive skills including attention, and psychosocial skills including coping strategies, environmental adaptation, habits, and routines and behaviors.   IMPAIRMENTS are limiting patient from ADLs, IADLs, and social participation.   COMORBIDITIES may have co-morbidities  that affects occupational performance. Patient will benefit from skilled OT to address above impairments and improve overall function.  MODIFICATION OR ASSISTANCE TO COMPLETE EVALUATION: Min-Moderate modification of tasks or assist with assess necessary to complete an evaluation.  OT OCCUPATIONAL  PROFILE AND HISTORY: Detailed assessment: Review of records and additional review of physical, cognitive, psychosocial history related to current functional performance.  CLINICAL DECISION MAKING: Moderate - several treatment options, min-mod task modification necessary  REHAB POTENTIAL: Good  EVALUATION COMPLEXITY: Moderate    PLAN: OT FREQUENCY: 2x/week  OT DURATION: 12 weeks  PLANNED INTERVENTIONS: self care/ADL training, therapeutic exercise, therapeutic activity, neuromuscular re-education, manual therapy, passive range of motion, and paraffin  RECOMMENDED OTHER SERVICES: ST, and PT  CONSULTED AND AGREED WITH PLAN OF CARE: Patient  PLAN FOR NEXT SESSION:  Begin OT treatment sessions      Olegario Messier, MS, OTR/L  03/04/2022, 4:14 PM

## 2022-03-06 ENCOUNTER — Encounter: Payer: Self-pay | Admitting: Speech Pathology

## 2022-03-06 ENCOUNTER — Ambulatory Visit: Payer: Medicare HMO | Admitting: Speech Pathology

## 2022-03-06 DIAGNOSIS — I63511 Cerebral infarction due to unspecified occlusion or stenosis of right middle cerebral artery: Secondary | ICD-10-CM

## 2022-03-06 DIAGNOSIS — R41841 Cognitive communication deficit: Secondary | ICD-10-CM

## 2022-03-06 NOTE — Therapy (Signed)
OUTPATIENT SPEECH LANGUAGE PATHOLOGY  COGNITION EVALUATION   Patient Name: Alyssa Mayo MRN: [id number] DOB:March 12, 1946, 76 y.o., female Today's Date: 03/06/2022  PCP: [name], MD REFERRING PROVIDER: [name], NP   End of Session - 03/06/22 1250     Visit Number 1    Number of Visits 25    Date for SLP Re-Evaluation 05/27/22    Authorization Type [employer] HMO/PPO    Progress Note Due on Visit 10    SLP Start Time 1500    SLP Stop Time  1600    SLP Time Calculation (min) 60 min    Activity Tolerance Patient tolerated treatment well             Past Medical History:  Diagnosis Date   Abnormal levels of other serum enzymes 07/17/2015   Arthritis    Constipation 07/11/2015   COVID-19 03/06/2021   Diabetes mellitus without complication (HCC)    Elevated liver enzymes    Fatty liver    Generalized abdominal pain 07/11/2015   Hyperlipidemia    Hypertension    Lifelong obesity    Macular degeneration    Morbid obesity due to excess calories (HCC) 07/11/2015   Other fatigue 07/11/2015   Sleep apnea    Spinal headache    Past Surgical History:  Procedure Laterality Date   ABDOMINAL HYSTERECTOMY     APPENDECTOMY     CATARACT EXTRACTION     CERVICAL LAMINECTOMY  07/21/1985   CESAREAN SECTION     COLONOSCOPY  07/21/2012   diverticulosis, [facility] Dr. [name]    COLONOSCOPY WITH PROPOFOL N/A 02/04/2019   Procedure: COLONOSCOPY WITH PROPOFOL;  Surgeon: [name], MD;  Location: [facility] ENDOSCOPY;  Service: Endoscopy;  Laterality: N/A;   COLONOSCOPY WITH PROPOFOL N/A 03/18/2021   Procedure: COLONOSCOPY WITH PROPOFOL;  Surgeon: [name], MD;  Location: [facility] ENDOSCOPY;  Service: Endoscopy;  Laterality: N/A;  DM   DIAGNOSTIC LAPAROSCOPY     ESOPHAGOGASTRODUODENOSCOPY (EGD) WITH PROPOFOL N/A 02/04/2019   Procedure: ESOPHAGOGASTRODUODENOSCOPY (EGD) WITH PROPOFOL;  Surgeon: [name], MD;  Location: [facility] ENDOSCOPY;  Service:  Endoscopy;  Laterality: N/A;   ESOPHAGOGASTRODUODENOSCOPY (EGD) WITH PROPOFOL N/A 03/18/2021   Procedure: ESOPHAGOGASTRODUODENOSCOPY (EGD) WITH PROPOFOL;  Surgeon: [name], MD;  Location: [facility] ENDOSCOPY;  Service: Endoscopy;  Laterality: N/A;   LOOP RECORDER INSERTION N/A 08/05/2021   Procedure: LOOP RECORDER INSERTION;  Surgeon: [name], MD;  Location: [facility] INVASIVE CV LAB;  Service: Cardiovascular;  Laterality: N/A;   SPINE SURGERY     TUBAL LIGATION     Patient Active Problem List   Diagnosis Date Noted   Cerebral edema (HCC) 08/07/2021   OSA (obstructive sleep apnea) 08/07/2021   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   CVA (cerebral vascular accident) (HCC) 08/01/2021   GERD (gastroesophageal reflux disease) 07/20/2019   Advanced care planning/counseling discussion 12/17/2016   Skin lesions, generalized 11/13/2016   Chronic right hip pain 06/17/2016   Vitamin D deficiency 09/26/2015   Diastasis recti 08/08/2015   Elevated serum GGT level 07/24/2015   Essential hypertension 07/17/2015   Fatty liver 07/17/2015   Elevated alkaline phosphatase level 07/17/2015   Elevated serum glutamic pyruvic transaminase (SGPT) level 07/17/2015   Abnormal finding on EKG 07/11/2015   Morbid obesity (HCC) 07/11/2015   Colon, diverticulosis 07/11/2015   Abdominal wall hernia 07/11/2015   DM (diabetes mellitus), type 2 with neurological complications (HCC)    Hyperlipidemia     ONSET DATE:  08/01/2021 CVA; 02/26/2022 date of referral  REFERRING DIAG: I69.319 (ICD-10-CM) - Cognitive deficit, post-stroke I63.511 (ICD-10-CM) - Right middle cerebral artery stroke (HCC)   THERAPY DIAG:  Cognitive communication deficit  Right middle cerebral artery stroke ([place])  Rationale for Evaluation and Treatment Rehabilitation  SUBJECTIVE:   SUBJECTIVE STATEMENT: Pt tearful, concerned that she won't get better Pt accompanied by: self  PERTINENT HISTORY: Alyssa Mayo is a 76 y.o. female with history of diabetes, hypertension, hyperlipidemia, obesity, and sleep apnea who presented to [facility] ED on 08/01/2021 after being found down by her son. Her last know well was approximately 07/29/2021. Pt transferred to [facility] for further CVA management. In addition, pt received ST services in CIR from 08/08/2021 thru 08/21/2021.    DIAGNOSTIC FINDINGS: MRI showed extensive restricted diffusion throughout much of the right MCA territory with associated changes of hemorrhagic transformation and localized cerebral edema and mass effect. Per Dr. Leonie Man, felt embolic pattern secondary to unclear source although concerning for occult A fib. Repeat [facility] 1/14 showed no interval progression of right MCA hemorrhagic infarct with petechial hemorrhage.  CTA head/neck showed right M2 thrombus.  EF 60 to 65%.     PAIN:  Are you having pain? No   FALLS: Has patient fallen in last 6 months?  No  LIVING ENVIRONMENT: Lives with: lives alone Lives in: House/apartment  PLOF:  Level of assistance: Independent with ADLs, Independent with IADLs Employment: Retired   PATIENT GOALS to be able to drive again and live independently (she currently lives alone but her son plans on moving in with her)  OBJECTIVE:   COGNITIVE COMMUNICATION Overall cognitive status: Impaired Areas of impairment:  Attention: Impaired: Selective, Alternating, Divided Memory: Impaired: Working Awareness: WFL Executive function: Impaired: Problem solving, Organization, Planning, Self-correction, and Slow processing Behavior: Within functional limits Auditory comprehension: WFL Verbal expression: WFL Functional communication: WFL Functional deficits: pt reports being unable to pass cognitive portions of driving test, unable to sequence cooking, unable to manage her medicines and finances post CVA  ORAL MOTOR EXAMINATION Facial : WFL Lingual: WFL Velum: WFL Mandible: WFL Cough: WFL Voice: WFL    STANDARDIZED ASSESSMENTS:  Cognitive Linguistic Quick Test  AGE - 70-89   The Cognitive Linguistic Quick Test (CLQT) was administered to assess the relative status of five cognitive domains: attention, memory, language, executive functioning, and visuospatial skills. Scores from 10 tasks were used to estimate severity ratings (standardized for age groups 18-69 years and 70-89 years) for each domain, a clock drawing task, as well as an overall composite severity rating of cognition.       Task Score Criterion Cut Scores  Personal Facts 8/8 8  Symbol Cancellation 11/12 10  Confrontation Naming 10/10 10  Clock Drawing  8/13 11  Story Retelling 8/10 5  Symbol Trails 5/10 6  Generative Naming 4/9 4  Design Memory 5/6 4  Mazes  0/8 4  Design Generation 1/13 5    Cognitive Domain Composite Score Severity Rating  Attention 141/215 Mild  Memory 158/185 WNL  Executive Function 10/40 Moderate  Language 30/37 WNL  Visuospatial Skills 53/105 Mild  Clock Drawing  8/13 Moderate  Composite Severity Rating  Mild     PATIENT REPORTED OUTCOME MEASURES (PROM): Deferred to next session   TODAY'S TREATMENT:  Skilled treatment focused on reviewing pt's MRI impression d/t pt's confusion regarding hemorrhagic vs infarct. 30 minutes spend writing and verbally describing infarct nature of stroke (blockage) and the mention of hemorrhage in report. Additional  information provided on location of stroke and relationship to cognitive deficits and potential for recovery.    PATIENT EDUCATION: Education details: results of this evaluation and ST POC Person educated: Patient Education method: Explanation, Verbal cues, and Handouts Education comprehension: verbalized understanding and needs further education     GOALS: Goals reviewed with patient? Yes  SHORT TERM GOALS: Target date: 10 sessions  With moderate assistance, pt will recall her medicines with 80% accuracy.  Baseline: currently  unable Goal status: INITIAL  2.  With minimal assistance, pt will complete basic medication management task with > 90% accuracy.  Baseline: total Goal status: INITIAL  3.  Pt will demonstrate selective attention to basic task for 15 minutes with < 2 redirections.  Baseline: moderate assistance Goal status: INITIAL  4.  Pt will complete basic bill paying task with 75% accuracy.  Baseline: total Goal status: INITIAL   LONG TERM GOALS: Target date: 05/27/2022 With rare min A, pt will organize her medicines accurately in a pill organizer.  Baseline: Total Goal status: INITIAL  2.  With rare min A, pt will demonstrate selective attention to task for 30 minutes.  Baseline: moderate Goal status: INITIAL  3.  With rare min A, pt will sequence basic meal prep activity.  Baseline: Moderate Goal status: INITIAL  ASSESSMENT:  CLINICAL IMPRESSION: Patient is a 76 y.o. female who was seen today for a formal cognitive linguistic evaluation d/t cognitive concerns related to recent CVA.  Pt presents with mild to moderate cognitive impairment with mild deficits in attention, language and visuospatial skills; moderate deficits in executive functions and memory judged to be functional (per objective assessment). In addition, pt appeared with flat affect, slow processing, appearance of flat affect and frequent request for repetition of verbal information. When questioned, pt reports that she has " been told" that she has a hearing loss and it was recommended that she wear hearing aids. Recommend pt schedule another audiological appt to evaluate hearing loss and for calliberation of her current set of hearing aids. Also recommend that pt's son attend next session to review findings as well as information about pt's CVA (infarct with mild hemorrhagic conversion).   OBJECTIVE IMPAIRMENTS include attention and executive functioning. These impairments are limiting patient from managing medications, managing  appointments, managing finances, household responsibilities, and ADLs/IADLs. Factors affecting potential to achieve goals and functional outcome are ability to learn/carryover information.. Patient will benefit from skilled SLP services to address above impairments and improve overall function.  REHAB POTENTIAL: Good  PLAN: SLP FREQUENCY: 1-2x/week  SLP DURATION: 12 weeks  PLANNED INTERVENTIONS: Environmental controls, Cognitive reorganization, Internal/external aids, Functional tasks, SLP instruction and feedback, Compensatory strategies, and Patient/family education   Adlee Paar B. Dreama Saa, M.S., CCC-SLP, Tree surgeon Certified Brain Injury Specialist [facility]  [facility] Rehabilitation Services Office [phone] Ascom [phone] Fax [phone]

## 2022-03-08 NOTE — Therapy (Signed)
OUTPATIENT SPEECH LANGUAGE PATHOLOGY TREATMENT NOTE   Patient Name: Alyssa Mayo MRN: [id number] DOB:1945-09-22, 76 y.o., female Today's Date: 03/08/2022  PCP: [name], MD REFERRING PROVIDER: [name], NP  END OF SESSION:   End of Session - 03/08/22 1659     Visit Number 2    Number of Visits 25    Date for SLP Re-Evaluation 05/27/22    Authorization Type [employer] Medicare HMO/PPO    Progress Note Due on Visit 10    SLP Start Time 1300    SLP Stop Time  1410    SLP Time Calculation (min) 70 min    Activity Tolerance Patient tolerated treatment well             Past Medical History:  Diagnosis Date   Abnormal levels of other serum enzymes 07/17/2015   Arthritis    Constipation 07/11/2015   COVID-19 03/06/2021   Diabetes mellitus without complication (HCC)    Elevated liver enzymes    Fatty liver    Generalized abdominal pain 07/11/2015   Hyperlipidemia    Hypertension    Lifelong obesity    Macular degeneration    Morbid obesity due to excess calories (HCC) 07/11/2015   Other fatigue 07/11/2015   Sleep apnea    Spinal headache    Past Surgical History:  Procedure Laterality Date   ABDOMINAL HYSTERECTOMY     APPENDECTOMY     CATARACT EXTRACTION     CERVICAL LAMINECTOMY  07/21/1985   CESAREAN SECTION     COLONOSCOPY  07/21/2012   diverticulosis, [facility] Dr. [name]    COLONOSCOPY WITH PROPOFOL N/A 02/04/2019   Procedure: COLONOSCOPY WITH PROPOFOL;  Surgeon: [name], MD;  Location: [facility] ENDOSCOPY;  Service: Endoscopy;  Laterality: N/A;   COLONOSCOPY WITH PROPOFOL N/A 03/18/2021   Procedure: COLONOSCOPY WITH PROPOFOL;  Surgeon: [name], MD;  Location: [facility] ENDOSCOPY;  Service: Endoscopy;  Laterality: N/A;  DM   DIAGNOSTIC LAPAROSCOPY     ESOPHAGOGASTRODUODENOSCOPY (EGD) WITH PROPOFOL N/A 02/04/2019   Procedure: ESOPHAGOGASTRODUODENOSCOPY (EGD) WITH PROPOFOL;  Surgeon: [name], MD;  Location: [facility] ENDOSCOPY;  Service:  Endoscopy;  Laterality: N/A;   ESOPHAGOGASTRODUODENOSCOPY (EGD) WITH PROPOFOL N/A 03/18/2021   Procedure: ESOPHAGOGASTRODUODENOSCOPY (EGD) WITH PROPOFOL;  Surgeon: [name], MD;  Location: [facility] ENDOSCOPY;  Service: Endoscopy;  Laterality: N/A;   LOOP RECORDER INSERTION N/A 08/05/2021   Procedure: LOOP RECORDER INSERTION;  Surgeon: [name], MD;  Location: [facility] INVASIVE CV LAB;  Service: Cardiovascular;  Laterality: N/A;   SPINE SURGERY     TUBAL LIGATION     Patient Active Problem List   Diagnosis Date Noted   Cerebral edema (HCC) 08/07/2021   OSA (obstructive sleep apnea) 08/07/2021   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   CVA (cerebral vascular accident) (HCC) 08/01/2021   GERD (gastroesophageal reflux disease) 07/20/2019   Advanced care planning/counseling discussion 12/17/2016   Skin lesions, generalized 11/13/2016   Chronic right hip pain 06/17/2016   Vitamin D deficiency 09/26/2015   Diastasis recti 08/08/2015   Elevated serum GGT level 07/24/2015   Essential hypertension 07/17/2015   Fatty liver 07/17/2015   Elevated alkaline phosphatase level 07/17/2015   Elevated serum glutamic pyruvic transaminase (SGPT) level 07/17/2015   Abnormal finding on EKG 07/11/2015   Morbid obesity (HCC) 07/11/2015   Colon, diverticulosis 07/11/2015   Abdominal wall hernia 07/11/2015   DM (diabetes mellitus), type 2 with neurological complications (HCC)    Hyperlipidemia  ONSET DATE: 08/01/2021 CVA; 02/26/2022 date of referral   REFERRING DIAG: I69.319 (ICD-10-CM) - Cognitive deficit, post-stroke I63.511 (ICD-10-CM) - Right middle cerebral artery stroke [facility])     PERTINENT HISTORY: Alyssa Mayo is a 76 y.o. female with history of diabetes, hypertension, hyperlipidemia, obesity, and sleep apnea who presented to [facility] ED on 08/01/2021 after being found down by her son. Her last know well was approximately 07/29/2021. Pt transferred to [facility] for further CVA  management. In addition, pt received ST services in CIR from 08/08/2021 thru 08/21/2021.      DIAGNOSTIC FINDINGS: MRI showed extensive restricted diffusion throughout much of the right MCA territory with associated changes of hemorrhagic transformation and localized cerebral edema and mass effect. Per Dr. Pearlean Brownie, felt embolic pattern secondary to unclear source although concerning for occult A fib. Repeat [facility] 1/14 showed no interval progression of right MCA hemorrhagic infarct with petechial hemorrhage.  CTA head/neck showed right M2 thrombus.  EF 60 to 65%.   THERAPY DIAG:  Cognitive communication deficit  Right middle cerebral artery stroke [facility])  Rationale for Evaluation and Treatment Rehabilitation  SUBJECTIVE: Pt pleasant, accompanied by her son per SLP request for education  Pt accompanied by: family member  PAIN:  Are you having pain? No  PATIENT GOALS to be able to drive again and live independently (she currently lives alone but her son plans on moving in with her)  OBJECTIVE:   TODAY'S TREATMENT: Skilled treatment session focused on pt's cognitive communication goals. SLP facilitated session by providing education to pt's son on results of recent evaluation. further information also obtained from son on pt's acute cognitive deficits. Pt and son report acute on chronic disorganization, decreased attention and complex problem solving. Cognitive organization was targeted in bill paying. Currently pt stated that shepaid 2 bills this morning but her son was unaware and he was under the impression that he was handling all bills. Pt described her current disorganized method of bill paying and had difficulty with various recommendation suggested to increase organization. Pt was receptive to marking paid on each bill and placing them all into a folder that was provided by SLP. In addition, pt frequently looses attention to tasks. Such as leaving trach and items on counter. Instruction  provided that when an object is in pt's hand, the item is to be placed in it's "final destination" (i.e., trash can, desk, frigerator). Instruciton also provided in puting up perishable groceries first instead of dry goods.     PATIENT EDUCATION: Education details: Pension scheme manager Person educated: Patient and Child(ren) Education method: Explanation, Facilities manager, Verbal cues, and Handouts Education comprehension: needs further education  GOALS: Goals reviewed with patient? Yes   SHORT TERM GOALS: Target date: 10 sessions   With moderate assistance, pt will recall her medicines with 80% accuracy.  Baseline: currently unable Goal status: INITIAL   2.  With minimal assistance, pt will complete basic medication management task with > 90% accuracy.  Baseline: total Goal status: INITIAL   3.  Pt will demonstrate selective attention to basic task for 15 minutes with < 2 redirections.  Baseline: moderate assistance Goal status: INITIAL   4.  Pt will complete basic bill paying task with 75% accuracy.  Baseline: total Goal status: INITIAL     LONG TERM GOALS: Target date: 05/27/2022 With rare min A, pt will organize her medicines accurately in a pill organizer.  Baseline: Total Goal status: INITIAL   2.  With rare min A, pt will demonstrate  selective attention to task for 30 minutes.  Baseline: moderate Goal status: INITIAL   3.  With rare min A, pt will sequence basic meal prep activity.  Baseline: Moderate Goal status: INITIAL    ASSESSMENT:  CLINICAL IMPRESSION: Pt able to identify several cognitive areas that she would like to improve.    OBJECTIVE IMPAIRMENTS include attention and executive functioning. These impairments are limiting patient from managing medications, managing appointments, managing finances, household responsibilities, and ADLs/IADLs. Factors affecting potential to achieve goals and functional outcome are ability to learn/carryover  information. Patient will benefit from skilled SLP services to address above impairments and improve overall function.  REHAB POTENTIAL: Good  PLAN: SLP FREQUENCY: 1-2x/week  SLP DURATION: 12 weeks  PLANNED INTERVENTIONS: Functional tasks, SLP instruction and feedback, and Patient/family education   Selina Tapper B. Dreama Saa, M.S., CCC-SLP, Tree surgeon Certified Brain Injury Specialist [facility]  [facility] Rehabilitation Services Office [phone] Ascom [phone] Fax [phone]

## 2022-03-10 ENCOUNTER — Encounter: Payer: Self-pay | Admitting: Occupational Therapy

## 2022-03-10 ENCOUNTER — Ambulatory Visit: Payer: Medicare HMO | Admitting: Occupational Therapy

## 2022-03-10 DIAGNOSIS — M6281 Muscle weakness (generalized): Secondary | ICD-10-CM

## 2022-03-10 DIAGNOSIS — R41841 Cognitive communication deficit: Secondary | ICD-10-CM | POA: Diagnosis not present

## 2022-03-10 DIAGNOSIS — R278 Other lack of coordination: Secondary | ICD-10-CM

## 2022-03-10 NOTE — Therapy (Signed)
OUTPATIENT OCCUPATIONAL THERAPY TREATMENT NOTE  Patient Name: Alyssa Mayo MRN: [id number] DOB:July 29, 1945, 76 y.o., female Today's Date: 03/10/2022  PCP: [name], MD REFERRING PROVIDER: [name], NP   OT End of Session - 03/10/22 1704     Visit Number 2    Number of Visits 24    Date for OT Re-Evaluation 05/27/22    Authorization Type Progress reporting period starting 03/04/2022    OT Start Time 1600    OT Stop Time 1645    OT Time Calculation (min) 45 min    Activity Tolerance Patient tolerated treatment well    Behavior During Therapy [facility] for tasks assessed/performed             Past Medical History:  Diagnosis Date   Abnormal levels of other serum enzymes 07/17/2015   Arthritis    Constipation 07/11/2015   COVID-19 03/06/2021   Diabetes mellitus without complication (HCC)    Elevated liver enzymes    Fatty liver    Generalized abdominal pain 07/11/2015   Hyperlipidemia    Hypertension    Lifelong obesity    Macular degeneration    Morbid obesity due to excess calories (HCC) 07/11/2015   Other fatigue 07/11/2015   Sleep apnea    Spinal headache    Past Surgical History:  Procedure Laterality Date   ABDOMINAL HYSTERECTOMY     APPENDECTOMY     CATARACT EXTRACTION     CERVICAL LAMINECTOMY  07/21/1985   CESAREAN SECTION     COLONOSCOPY  07/21/2012   diverticulosis, [facility] Dr. [name]    COLONOSCOPY WITH PROPOFOL N/A 02/04/2019   Procedure: COLONOSCOPY WITH PROPOFOL;  Surgeon: [name], MD;  Location: [facility] ENDOSCOPY;  Service: Endoscopy;  Laterality: N/A;   COLONOSCOPY WITH PROPOFOL N/A 03/18/2021   Procedure: COLONOSCOPY WITH PROPOFOL;  Surgeon: [name], MD;  Location: [facility] ENDOSCOPY;  Service: Endoscopy;  Laterality: N/A;  DM   DIAGNOSTIC LAPAROSCOPY     ESOPHAGOGASTRODUODENOSCOPY (EGD) WITH PROPOFOL N/A 02/04/2019   Procedure: ESOPHAGOGASTRODUODENOSCOPY (EGD) WITH PROPOFOL;  Surgeon: [name], MD;  Location:  [facility] ENDOSCOPY;  Service: Endoscopy;  Laterality: N/A;   ESOPHAGOGASTRODUODENOSCOPY (EGD) WITH PROPOFOL N/A 03/18/2021   Procedure: ESOPHAGOGASTRODUODENOSCOPY (EGD) WITH PROPOFOL;  Surgeon: [name], MD;  Location: [facility] ENDOSCOPY;  Service: Endoscopy;  Laterality: N/A;   LOOP RECORDER INSERTION N/A 08/05/2021   Procedure: LOOP RECORDER INSERTION;  Surgeon: [name], MD;  Location: [facility] INVASIVE CV LAB;  Service: Cardiovascular;  Laterality: N/A;   SPINE SURGERY     TUBAL LIGATION     Patient Active Problem List   Diagnosis Date Noted   Cerebral edema (HCC) 08/07/2021   OSA (obstructive sleep apnea) 08/07/2021   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   CVA (cerebral vascular accident) (HCC) 08/01/2021   GERD (gastroesophageal reflux disease) 07/20/2019   Advanced care planning/counseling discussion 12/17/2016   Skin lesions, generalized 11/13/2016   Chronic right hip pain 06/17/2016   Vitamin D deficiency 09/26/2015   Diastasis recti 08/08/2015   Elevated serum GGT level 07/24/2015   Essential hypertension 07/17/2015   Fatty liver 07/17/2015   Elevated alkaline phosphatase level 07/17/2015   Elevated serum glutamic pyruvic transaminase (SGPT) level 07/17/2015   Abnormal finding on EKG 07/11/2015   Morbid obesity (HCC) 07/11/2015   Colon, diverticulosis 07/11/2015   Abdominal wall hernia 07/11/2015   DM (diabetes mellitus), type 2 with neurological complications (HCC)    Hyperlipidemia     ONSET DATE:  08/01/2021  REFERRING DIAG: CVA  THERAPY DIAG:  Muscle weakness (generalized)  Other lack of coordination  Rationale for Evaluation and Treatment Rehabilitation  SUBJECTIVE:   SUBJECTIVE STATEMENT:   Pt. Reports that she is trying to spend as much time with her grandson as she can before school starts.  Pt accompanied by: self  PERTINENT HISTORY:  Pt. Is a 76 y.o. female  presents with a diagnosis of RMCA CVA infarction with hemorrhagic  transformation. Pt. Attended inpatient rehab from 08/07/2021-08/22/2021. Pt. received Home health therapy services this past spring.  Pt. Has recently had an assessment through driver rehabilitation services at [facility] with recommendations for referrals for  outpatient OT/PT, and ST services. Pt. PMHx includes: HTN, Hyperlipidemia, Macular degeneration, DM, and Obesity.  PRECAUTIONS: None  WEIGHT BEARING RESTRICTIONS No  PAIN:  Are you having pain? No  FALLS: Has patient fallen in last 6 months? Yes, 1 fall out of bed at the time of the stroke, and one fall out of bed just after returning home from the hospital.   LIVING ENVIRONMENT: Lives with: lives with their family  Son  [name] Lives in: House/apartment Main living are on one floor Stairs:  2 steps to enter Has following equipment at home: Single point cane, Wheelchair (manual), shower chair, Grab bars, bed rail, and rubber mat.  PLOF: Independent  PATIENT GOALS: To be able to Drive, and cook again  OBJECTIVE:   HAND DOMINANCE: Right   ADLs: Overall ADLs:  Transfers/ambulation related to ADLs: Independent Eating: Independent Grooming: Pt. Fatigues with sustained BUE's in elevation for haircare. Especially with blow drying hair. UB Dressing: Independent pullover shirt, independent now with fastening a bra LB Dressing: Independent donning pants socks, and slide on shoes Toileting: Independent Bathing: Independent Tub Shower transfers: Walk-in shower Independent now    IADLs: Shopping: Needs to be accompanied to the grocery store. Light housekeeping: Does own laundry, laundry,  Meal Prep:  Difficulty with cooking Community mobility: Relies on son, and friend Medication management: Son sets up Mining engineer management: Manages monthly bills independently. Handwriting: No change from baseline  MOBILITY STATUS: Hx of falls out of bed   ACTIVITY TOLERANCE: Activity tolerance:  10-20 min. Before rest break  FUNCTIONAL  OUTCOME MEASURES: FOTO: 57  TR score: 62  UPPER EXTREMITY ROM     Active ROM Right eval Left eval  Shoulder flexion [facility] 96(110)  Shoulder abduction [facility] 82(105)  Shoulder adduction    Shoulder extension    Shoulder internal rotation    Shoulder external rotation    Elbow flexion [facility] WFL  Elbow extension [facility]  Wrist flexion    Wrist extension WFL 44(60  Wrist ulnar deviation WFL 32(60)  Wrist radial deviation    Wrist pronation    Wrist supination    (Blank rows = not tested)   UPPER EXTREMITY MMT:     MMT Right eval Left eval  Shoulder flexion 4+/5 3-/5  Shoulder abduction 4+/5 3-/5  Shoulder adduction    Shoulder extension    Shoulder internal rotation    Shoulder external rotation    Middle trapezius    Lower trapezius    Elbow flexion 4+/5 4-/5  Elbow extension 4+/5 4-/5  Wrist flexion 4+/5 3-/5  Wrist extension 4+/5 3/5  Wrist ulnar deviation    Wrist radial deviation    Wrist pronation    Wrist supination    (Blank rows = not tested)  HAND FUNCTION: Grip strength: Right: 30 lbs; Left: 13 lbs, Lateral  pinch: Right: 15 lbs, Left: 8 lbs, and 3 point pinch: Right: 13 lbs, Left: 7 lbs  COORDINATION: 9 Hole Peg test: Right: 26  sec; Left: 1 min. & 9 sec  SENSATION: Light touch: WFL Proprioception: WFL  COGNITION: Overall cognitive status: Within functional limits for tasks assessed  VISION: Subjective report: Glasses. Just received a new prescription to increase bifocal strength Baseline vision: Macular Degeneration Visual history: macular degeneration  VISION ASSESSMENT: To be further assessed in functional context  Left sided Awareness    PERCEPTION: Limited left sided awareness   TODAY'S TREATMENT:   Pt. worked on [employer] ex. for hand strengthening. Exercises including: gross gripping, lateral, and 3pt. Pinch strengthening, and thumb opposition. Pt. Was provided with a visual handout HEP through [employer]. Pt. performed gross  gripping with a gross grip strengthener. The Gripper was set to  6.6 # of grip strength resistance, and increased to 11.2#.  Pt. worked on pinch strengthening in the left hand for lateral, and 3pt. pinch using yellow, red, and green resistive clips. Pt. worked on placing the clips at various vertical and horizontal angles. Tactile and verbal cues were required for eliciting the desired movement. Pt. worked on [facility] skills grasping 1" sticks, and 1/4" collars washers. Pt. worked on storing the objects in the palm, and translatory skills moving the items from the palm of the hand to the tip of the 2nd digit, and thumb. Pt. worked on removing the pegs using bilateral alternating hand patterns.   Pt. Reports having had increased difficulty threading a needle this weekend. Pt. required visual cues, visual demonstration, and step-by-step cues for movement patterns, and technique with the left hand. Pt. was able to perform reps of grip strengthening however had difficulty with grasping the vertical pegs in order to place them into a container at the tabletop. Pt. was able to demonstrate the theraputty exercises with a visual handout. Pt. presented with limited left hand [facility] skills with difficulty performing translatory movements with the left hand moving the 1" sticks from her palm to the tip of the 2nd digit, and thumb. Pt. Required increased time to perform all [facility] tasks with her left hand, and require consistent cues for bilateral alternating hand movements. Pt. Continues to work on improving eye hand coordination skill, and LUE functioning in order to increase engagement of the LUE during ADLs, and IADL tasks.    PATIENT EDUCATION: Education details: OT services POC, and goals Person educated: Patient Education method: Medical illustrator Education comprehension: returned demonstration   HOME EXERCISE PROGRAM:  Continue with an ongoing assessment of HEP needs    GOALS: Goals reviewed with  patient? Yes   SHORT TERM GOALS: Target date:  04/15/2022      Pt. Will improve FOTO score by 2 points to reflect improved pt. perceived functional performance Baseline: FOTO: 57, TR score: 62 Goal status: INITIAL  LONG TERM GOALS: Target date: 05/27/2022    Pt. Will improve left UE strength by 2 mm grades to assist with repositioning her grandson on her lap. Baseline: Eval: left shoulder flexion: 3-/5, abduction 3-/5,  elbow flex/ext. 4-/5, wrist extension 3/5, wrist flexion 3-/5  Goal status: INITIAL  2.  Pt. will improve left grip strength by 5# to be able to independently hold and use a blow dryer, and brush for hair care. Baseline: Eval: Left grip strength 13#. Pt. Has difficulty performing hair care, holding and using a brush, and blow dryer. Goal status: INITIAL  3.  Pt. Will  improve left lateral pinch strength by 3# to be able to independently cut meat. Baseline: Eval: Left 8, Pt. Has difficulty cutting meat Goal status: INITIAL  4.  Pt. Will improve Left hand [facility] skills to be able to be able to independently, and efficiently manipulate small objects during ADL tasks. Baseline: Eval: left: 1 min. 9 sec. Goal status: INITIAL  5.  Pt. Will perform light meal preparation with modified independence Baseline: Eval: Pt. Reports having difficulty performing light  meal preparation, and cooking tasks. Goal status: INITIAL  ASSESSMENT:  CLINICAL IMPRESSION:   Pt. Reports having had increased difficulty threading a needle this weekend. Pt. required visual cues, visual demonstration, and step-by-step cues for movement patterns, and technique with the left hand. Pt. was able to perform reps of grip strengthening however had difficulty with grasping the vertical pegs in order to place them into a container at the tabletop. Pt. was able to demonstrate the theraputty exercises with a visual handout. Pt. presented with limited left hand [facility] skills with difficulty performing translatory  movements with the left hand moving the 1" sticks from her palm to the tip of the 2nd digit, and thumb. Pt. Required increased time to perform all [facility] tasks with her left hand, and require consistent cues for bilateral alternating hand movements. Pt. Continues to work on improving eye hand coordination skill, and LUE functioning in order to increase engagement of the LUE during ADLs, and IADL tasks.   PERFORMANCE DEFICITS in functional skills including ADLs, IADLs, coordination, dexterity, sensation, ROM, strength, FMC, decreased knowledge of use of DME, vision, and UE functional use, cognitive skills including attention, and psychosocial skills including coping strategies, environmental adaptation, habits, and routines and behaviors.   IMPAIRMENTS are limiting patient from ADLs, IADLs, and social participation.   COMORBIDITIES may have co-morbidities  that affects occupational performance. Patient will benefit from skilled OT to address above impairments and improve overall function.  MODIFICATION OR ASSISTANCE TO COMPLETE EVALUATION: Min-Moderate modification of tasks or assist with assess necessary to complete an evaluation.  OT OCCUPATIONAL PROFILE AND HISTORY: Detailed assessment: Review of records and additional review of physical, cognitive, psychosocial history related to current functional performance.  CLINICAL DECISION MAKING: Moderate - several treatment options, min-mod task modification necessary  REHAB POTENTIAL: Good  EVALUATION COMPLEXITY: Moderate    PLAN: OT FREQUENCY: 2x/week  OT DURATION: 12 weeks  PLANNED INTERVENTIONS: self care/ADL training, therapeutic exercise, therapeutic activity, neuromuscular re-education, manual therapy, passive range of motion, and paraffin  RECOMMENDED OTHER SERVICES: ST, and PT  CONSULTED AND AGREED WITH PLAN OF CARE: Patient  PLAN FOR NEXT SESSION:  Begin OT treatment sessions      Olegario Messier, MS, OTR/L  03/10/2022, 5:07  PM

## 2022-03-14 ENCOUNTER — Ambulatory Visit: Payer: Medicare HMO | Admitting: Occupational Therapy

## 2022-03-14 ENCOUNTER — Ambulatory Visit: Payer: Medicare HMO | Admitting: Speech Pathology

## 2022-03-14 DIAGNOSIS — I63511 Cerebral infarction due to unspecified occlusion or stenosis of right middle cerebral artery: Secondary | ICD-10-CM

## 2022-03-14 DIAGNOSIS — M6281 Muscle weakness (generalized): Secondary | ICD-10-CM

## 2022-03-14 DIAGNOSIS — R278 Other lack of coordination: Secondary | ICD-10-CM

## 2022-03-14 DIAGNOSIS — R41841 Cognitive communication deficit: Secondary | ICD-10-CM | POA: Diagnosis not present

## 2022-03-14 NOTE — Therapy (Signed)
OUTPATIENT SPEECH LANGUAGE PATHOLOGY TREATMENT NOTE   Patient Name: Alyssa Mayo MRN: [id number] DOB:[birth year], 76 y.o., female Today's Date: 03/14/2022  PCP: [name], MD REFERRING PROVIDER: [name], NP  END OF SESSION:   End of Session - 03/14/22 0905     Visit Number 3    Number of Visits 25    Date for SLP Re-Evaluation 05/27/22    Authorization Type [employer] HMO/PPO    Progress Note Due on Visit 10    SLP Start Time 0900    SLP Stop Time  1000    SLP Time Calculation (min) 60 min    Activity Tolerance Patient tolerated treatment well             Past Medical History:  Diagnosis Date   Abnormal levels of other serum enzymes 07/17/2015   Arthritis    Constipation 07/11/2015   COVID-19 03/06/2021   Diabetes mellitus without complication (HCC)    Elevated liver enzymes    Fatty liver    Generalized abdominal pain 07/11/2015   Hyperlipidemia    Hypertension    Lifelong obesity    Macular degeneration    Morbid obesity due to excess calories (HCC) 07/11/2015   Other fatigue 07/11/2015   Sleep apnea    Spinal headache    Past Surgical History:  Procedure Laterality Date   ABDOMINAL HYSTERECTOMY     APPENDECTOMY     CATARACT EXTRACTION     CERVICAL LAMINECTOMY  07/21/1985   CESAREAN SECTION     COLONOSCOPY  07/21/2012   diverticulosis, [facility] Dr. [name]    COLONOSCOPY WITH PROPOFOL N/A 02/04/2019   Procedure: COLONOSCOPY WITH PROPOFOL;  Surgeon: [name], MD;  Location: [facility] ENDOSCOPY;  Service: Endoscopy;  Laterality: N/A;   COLONOSCOPY WITH PROPOFOL N/A 03/18/2021   Procedure: COLONOSCOPY WITH PROPOFOL;  Surgeon: [name], MD;  Location: [facility] ENDOSCOPY;  Service: Endoscopy;  Laterality: N/A;  DM   DIAGNOSTIC LAPAROSCOPY     ESOPHAGOGASTRODUODENOSCOPY (EGD) WITH PROPOFOL N/A 02/04/2019   Procedure: ESOPHAGOGASTRODUODENOSCOPY (EGD) WITH PROPOFOL;  Surgeon: [name], MD;  Location: [facility] ENDOSCOPY;  Service:  Endoscopy;  Laterality: N/A;   ESOPHAGOGASTRODUODENOSCOPY (EGD) WITH PROPOFOL N/A 03/18/2021   Procedure: ESOPHAGOGASTRODUODENOSCOPY (EGD) WITH PROPOFOL;  Surgeon: [name], MD;  Location: [facility] ENDOSCOPY;  Service: Endoscopy;  Laterality: N/A;   LOOP RECORDER INSERTION N/A 08/05/2021   Procedure: LOOP RECORDER INSERTION;  Surgeon: [name], MD;  Location: [facility] INVASIVE CV LAB;  Service: Cardiovascular;  Laterality: N/A;   SPINE SURGERY     TUBAL LIGATION     Patient Active Problem List   Diagnosis Date Noted   Cerebral edema (HCC) 08/07/2021   OSA (obstructive sleep apnea) 08/07/2021   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   CVA (cerebral vascular accident) (HCC) 08/01/2021   GERD (gastroesophageal reflux disease) 07/20/2019   Advanced care planning/counseling discussion 12/17/2016   Skin lesions, generalized 11/13/2016   Chronic right hip pain 06/17/2016   Vitamin D deficiency 09/26/2015   Diastasis recti 08/08/2015   Elevated serum GGT level 07/24/2015   Essential hypertension 07/17/2015   Fatty liver 07/17/2015   Elevated alkaline phosphatase level 07/17/2015   Elevated serum glutamic pyruvic transaminase (SGPT) level 07/17/2015   Abnormal finding on EKG 07/11/2015   Morbid obesity (HCC) 07/11/2015   Colon, diverticulosis 07/11/2015   Abdominal wall hernia 07/11/2015   DM (diabetes mellitus), type 2 with neurological complications (HCC)    Hyperlipidemia  ONSET DATE: 08/01/2021 CVA; 02/26/2022 date of referral   REFERRING DIAG: 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 is a 76 y.o. female with history of diabetes, hypertension, hyperlipidemia, obesity, and sleep apnea who presented to [facility] ED on 08/01/2021 after being found down by her son. Her last know well was approximately 07/29/2021. Pt transferred to [facility] for further CVA  management. In addition, pt received ST services in CIR from 08/08/2021 thru 08/21/2021.      DIAGNOSTIC FINDINGS: MRI showed extensive restricted diffusion throughout much of the right MCA territory with associated changes of hemorrhagic transformation and localized cerebral edema and mass effect. Per Dr. Pearlean Brownie, felt embolic pattern secondary to unclear source although concerning for occult A fib. Repeat [facility] 1/14 showed no interval progression of right MCA hemorrhagic infarct with petechial hemorrhage.  CTA head/neck showed right M2 thrombus.  EF 60 to 65%.   THERAPY DIAG:  Cognitive communication deficit  Right middle cerebral artery stroke [facility])  Rationale for Evaluation and Treatment Rehabilitation  SUBJECTIVE: Pt pleasant, conversant  Pt accompanied by: family member  PAIN:  Are you having pain? No  PATIENT GOALS to be able to drive again and live independently (she currently lives alone but her son plans on moving in with her)  OBJECTIVE:   TODAY'S TREATMENT: Skilled treatment session focused on pt's cognitive communication goals. SLP facilitated session by having pt instruct SLP in novel card game. Pt able to explain and demonstrate with good ability.   SLP further facilitated session by providing instruction in novel semi-complex card game (double Solitaire). Pt required max to mod cues for attention to task, error awareness, problem solving and processing times.     PATIENT EDUCATION: Education details: Pension scheme manager Person educated: Patient and Child(ren) Education method: Explanation, Facilities manager, Verbal cues, and Handouts Education comprehension: needs further education  GOALS: Goals reviewed with patient? Yes   SHORT TERM GOALS: Target date: 10 sessions   With moderate assistance, pt will recall her medicines with 80% accuracy.  Baseline: currently unable Goal status: INITIAL   2.  With minimal assistance, pt will complete basic medication  management task with > 90% accuracy.  Baseline: total Goal status: INITIAL   3.  Pt will demonstrate selective attention to basic task for 15 minutes with < 2 redirections.  Baseline: moderate assistance Goal status: INITIAL   4.  Pt will complete basic bill paying task with 75% accuracy.  Baseline: total Goal status: INITIAL     LONG TERM GOALS: Target date: 05/27/2022 With rare min A, pt will organize her medicines accurately in a pill organizer.  Baseline: Total Goal status: INITIAL   2.  With rare min A, pt will demonstrate selective attention to task for 30 minutes.  Baseline: moderate Goal status: INITIAL   3.  With rare min A, pt will sequence basic meal prep activity.  Baseline: Moderate Goal status: INITIAL    ASSESSMENT:  CLINICAL IMPRESSION: Pt continues to present with moderate attention deficits, no error awareness, moderate semi-complex problem solving and moderately impaired processing.   OBJECTIVE IMPAIRMENTS include attention and executive functioning. These impairments are limiting patient from managing medications, managing appointments, managing finances, household responsibilities, and ADLs/IADLs. Factors affecting potential to achieve goals and functional outcome are ability to learn/carryover information. Patient will benefit from skilled SLP services to address above impairments and improve overall function.  REHAB POTENTIAL: Good  PLAN:  SLP FREQUENCY: 1-2x/week  SLP DURATION: 12 weeks  PLANNED INTERVENTIONS: Functional tasks, SLP instruction and feedback, and Patient/family education   Abrham Maslowski B. Dreama Saa, M.S., CCC-SLP, Tree surgeon Certified Brain Injury Specialist [facility]  [facility] Rehabilitation [facility] Office [phone] Ascom [phone] Fax [phone]

## 2022-03-15 ENCOUNTER — Encounter: Payer: Self-pay | Admitting: Occupational Therapy

## 2022-03-15 NOTE — Therapy (Signed)
OUTPATIENT OCCUPATIONAL THERAPY TREATMENT NOTE  Patient Name: Alyssa Mayo MRN: [id number] DOB:1946-03-01, 76 y.o., female Today's Date: 03/14/2022  PCP: [place] Pink, MD REFERRING PROVIDER: Frann Rider, NP   OT End of Session - 03/15/22 1844     Visit Number 3    Number of Visits 24    Date for OT Re-Evaluation 05/27/22    Authorization Type Progress reporting period starting 03/04/2022    OT Start Time 1000    OT Stop Time 1045    OT Time Calculation (min) 45 min    Activity Tolerance Patient tolerated treatment well    Behavior During Therapy [facility] for tasks assessed/performed             Past Medical History:  Diagnosis Date   Abnormal levels of other serum enzymes 07/17/2015   Arthritis    Constipation 07/11/2015   COVID-19 03/06/2021   Diabetes mellitus without complication ([place])    Elevated liver enzymes    Fatty liver    Generalized abdominal pain 07/11/2015   Hyperlipidemia    Hypertension    Lifelong obesity    Macular degeneration    Morbid obesity due to excess calories ([place]) 07/11/2015   Other fatigue 07/11/2015   Sleep apnea    Spinal headache    Past Surgical History:  Procedure Laterality Date   ABDOMINAL HYSTERECTOMY     APPENDECTOMY     CATARACT EXTRACTION     CERVICAL LAMINECTOMY  07/21/1985   CESAREAN SECTION     COLONOSCOPY  07/21/2012   diverticulosis, [facility] Dr. Donnella Sham    COLONOSCOPY WITH PROPOFOL N/A 02/04/2019   Procedure: COLONOSCOPY WITH PROPOFOL;  Surgeon: Lollie Sails, MD;  Location: [facility] ENDOSCOPY;  Service: Endoscopy;  Laterality: N/A;   COLONOSCOPY WITH PROPOFOL N/A 03/18/2021   Procedure: COLONOSCOPY WITH PROPOFOL;  Surgeon: Lesly Rubenstein, MD;  Location: [facility] ENDOSCOPY;  Service: Endoscopy;  Laterality: N/A;  DM   DIAGNOSTIC LAPAROSCOPY     ESOPHAGOGASTRODUODENOSCOPY (EGD) WITH PROPOFOL N/A 02/04/2019   Procedure: ESOPHAGOGASTRODUODENOSCOPY (EGD) WITH PROPOFOL;  Surgeon: Lollie Sails, MD;  Location:  [facility] ENDOSCOPY;  Service: Endoscopy;  Laterality: N/A;   ESOPHAGOGASTRODUODENOSCOPY (EGD) WITH PROPOFOL N/A 03/18/2021   Procedure: ESOPHAGOGASTRODUODENOSCOPY (EGD) WITH PROPOFOL;  Surgeon: Lesly Rubenstein, MD;  Location: [facility] ENDOSCOPY;  Service: Endoscopy;  Laterality: N/A;   LOOP RECORDER INSERTION N/A 08/05/2021   Procedure: LOOP RECORDER INSERTION;  Surgeon: Evans Lance, MD;  Location: [place] CV LAB;  Service: Cardiovascular;  Laterality: N/A;   SPINE SURGERY     TUBAL LIGATION     Patient Active Problem List   Diagnosis Date Noted   Cerebral edema ([place]) 08/07/2021   OSA (obstructive sleep apnea) 08/07/2021   Right middle cerebral artery stroke ([place]) 08/07/2021   CVA (cerebral vascular accident) ([place]) 08/01/2021   GERD (gastroesophageal reflux disease) 07/20/2019   Advanced care planning/counseling discussion 12/17/2016   Skin lesions, generalized 11/13/2016   Chronic right hip pain 06/17/2016   Vitamin D deficiency 09/26/2015   Diastasis recti 08/08/2015   Elevated serum GGT level 07/24/2015   Essential hypertension 07/17/2015   Fatty liver 07/17/2015   Elevated alkaline phosphatase level 07/17/2015   Elevated serum glutamic pyruvic transaminase (SGPT) level 07/17/2015   Abnormal finding on EKG 07/11/2015   Morbid obesity ([place]) 07/11/2015   Colon, diverticulosis 07/11/2015   Abdominal wall hernia 07/11/2015   DM (diabetes mellitus), type 2 with neurological complications ([place])    Hyperlipidemia     ONSET DATE:  08/01/2021  REFERRING DIAG: CVA  THERAPY DIAG:  Muscle weakness (generalized)  Other lack of coordination  Right middle cerebral artery stroke [facility])  Rationale for Evaluation and Treatment Rehabilitation  SUBJECTIVE:   SUBJECTIVE STATEMENT:   Pt reports she is doing well, no complaints this date.  Reports she has been using her putty at home for hand strengthening. Pt accompanied by: self  PERTINENT HISTORY:  Pt. Is a 76 y.o. female  presents  with a diagnosis of RMCA CVA infarction with hemorrhagic transformation. Pt. Attended inpatient rehab from 08/07/2021-08/22/2021. Pt. received Home health therapy services this past spring.  Pt. Has recently had an assessment through driver rehabilitation services at [facility] with recommendations for referrals for  outpatient OT/PT, and ST services. Pt. PMHx includes: HTN, Hyperlipidemia, Macular degeneration, DM, and Obesity.  PRECAUTIONS: None  WEIGHT BEARING RESTRICTIONS No  PAIN:  Are you having pain? No  FALLS: Has patient fallen in last 6 months? Yes, 1 fall out of bed at the time of the stroke, and one fall out of bed just after returning home from the hospital.   LIVING ENVIRONMENT: Lives with: lives with their family  Son  Marya Mayo Lives in: House/apartment Main living are on one floor Stairs:  2 steps to enter Has following equipment at home: Single point cane, Wheelchair (manual), shower chair, Grab bars, bed rail, and rubber mat.  PLOF: Independent  PATIENT GOALS: To be able to Drive, and cook again  OBJECTIVE:   HAND DOMINANCE: Right   ADLs: Overall ADLs:  Transfers/ambulation related to ADLs: Independent Eating: Independent Grooming: Pt. Fatigues with sustained BUE's in elevation for haircare. Especially with blow drying hair. UB Dressing: Independent pullover shirt, independent now with fastening a bra LB Dressing: Independent donning pants socks, and slide on shoes Toileting: Independent Bathing: Independent Tub Shower transfers: Walk-in shower Independent now    IADLs: Shopping: Needs to be accompanied to the grocery store. Light housekeeping: Does own laundry, laundry,  Meal Prep:  Difficulty with cooking Community mobility: Relies on son, and friend Medication management: Son sets up Building surveyor management: Manages monthly bills independently. Handwriting: No change from baseline  MOBILITY STATUS: Hx of falls out of bed   ACTIVITY TOLERANCE: Activity  tolerance:  10-20 min. Before rest break  FUNCTIONAL OUTCOME MEASURES: FOTO: 57  TR score: 62  UPPER EXTREMITY ROM     Active ROM Right eval Left eval  Shoulder flexion [facility] 96(110)  Shoulder abduction [facility] 82(105)  Shoulder adduction    Shoulder extension    Shoulder internal rotation    Shoulder external rotation    Elbow flexion [facility] WFL  Elbow extension [facility]  Wrist flexion    Wrist extension WFL 44(60  Wrist ulnar deviation WFL 32(60)  Wrist radial deviation    Wrist pronation    Wrist supination    (Blank rows = not tested)   UPPER EXTREMITY MMT:     MMT Right eval Left eval  Shoulder flexion 4+/5 3-/5  Shoulder abduction 4+/5 3-/5  Shoulder adduction    Shoulder extension    Shoulder internal rotation    Shoulder external rotation    Middle trapezius    Lower trapezius    Elbow flexion 4+/5 4-/5  Elbow extension 4+/5 4-/5  Wrist flexion 4+/5 3-/5  Wrist extension 4+/5 3/5  Wrist ulnar deviation    Wrist radial deviation    Wrist pronation    Wrist supination    (Blank rows = not tested)  HAND FUNCTION:  Grip strength: Right: 30 lbs; Left: 13 lbs, Lateral pinch: Right: 15 lbs, Left: 8 lbs, and 3 point pinch: Right: 13 lbs, Left: 7 lbs  COORDINATION: 9 Hole Peg test: Right: 26  sec; Left: 1 min. & 9 sec  SENSATION: Light touch: WFL Proprioception: WFL  COGNITION: Overall cognitive status: Within functional limits for tasks assessed  VISION: Subjective report: Glasses. Just received a new prescription to increase bifocal strength Baseline vision: Macular Degeneration Visual history: macular degeneration  VISION ASSESSMENT: To be further assessed in functional context  Left sided Awareness    PERCEPTION: Limited left sided awareness   TODAY'S TREATMENT:   Pt seen this date with focus on the following:   Therapeutic Exercises:  Pt seen for hand strengthening with use of hand gripper for sustained gripping patterns with use of 6.6# of  pressure to pick up jumbo pegs and move to container, cues for hand placement on gripper and guiding at times from therapist for coordinated movement.   Instructed on Putty teal with grip, 2 point and 3 point pinches AROM of digits, Difficulty with finger ADD  Some difficulty with opposition to small finger   Neuromuscular Reeducation: Pt seen for turning and flipping of pegs, end to end for 15 reps Manipulation of small pegs, to pick up and move with translatory skills of the hand and using hand for storage Reports she drops items often with her left hand, able to remove 5 small pegs at a time when using hand for storage.      PATIENT EDUCATION: Education details: OT services POC, and goals Person educated: Patient Education method: Medical illustrator Education comprehension: returned demonstration   HOME EXERCISE PROGRAM:  Continue with an ongoing assessment of HEP needs    GOALS: Goals reviewed with patient? Yes   SHORT TERM GOALS: Target date:  04/15/2022      Pt. Will improve FOTO score by 2 points to reflect improved pt. perceived functional performance Baseline: FOTO: 57, TR score: 62 Goal status: INITIAL  LONG TERM GOALS: Target date: 05/27/2022    Pt. Will improve left UE strength by 2 mm grades to assist with repositioning her grandson on her lap. Baseline: Eval: left shoulder flexion: 3-/5, abduction 3-/5,  elbow flex/ext. 4-/5, wrist extension 3/5, wrist flexion 3-/5  Goal status: INITIAL  2.  Pt. will improve left grip strength by 5# to be able to independently hold and use a blow dryer, and brush for hair care. Baseline: Eval: Left grip strength 13#. Pt. Has difficulty performing hair care, holding and using a brush, and blow dryer. Goal status: INITIAL  3.  Pt. Will improve left lateral pinch strength by 3# to be able to independently cut meat. Baseline: Eval: Left 8, Pt. Has difficulty cutting meat Goal status: INITIAL  4.  Pt. Will  improve Left hand [facility] skills to be able to be able to independently, and efficiently manipulate small objects during ADL tasks. Baseline: Eval: left: 1 min. 9 sec. Goal status: INITIAL  5.  Pt. Will perform light meal preparation with modified independence Baseline: Eval: Pt. Reports having difficulty performing light  meal preparation, and cooking tasks. Goal status: INITIAL  ASSESSMENT:  CLINICAL IMPRESSION:  Pt continues to progress with left hand skills, some difficulty with opposition to small finger this date as well as ADD of digits with digit ROM actively.  Pt demonstrates ability to pick up 1 inch objects, intiation of translatory skills of the hand and using the hand for storage  however she is slower with manipulation skills, dexterity and with coordinated movements especially when targeting isolated finger movement patterns.  She has teal putty for her HEP and able to demonstrate with cues for grip, 2 point, 3 point and lateral pinch patterns.  She continues to benefit from skilled OT services to maximize safety and independence in necessary daily tasks.     PERFORMANCE DEFICITS in functional skills including ADLs, IADLs, coordination, dexterity, sensation, ROM, strength, FMC, decreased knowledge of use of DME, vision, and UE functional use, cognitive skills including attention, and psychosocial skills including coping strategies, environmental adaptation, habits, and routines and behaviors.   IMPAIRMENTS are limiting patient from ADLs, IADLs, and social participation.   COMORBIDITIES may have co-morbidities  that affects occupational performance. Patient will benefit from skilled OT to address above impairments and improve overall function.  MODIFICATION OR ASSISTANCE TO COMPLETE EVALUATION: Min-Moderate modification of tasks or assist with assess necessary to complete an evaluation.  OT OCCUPATIONAL PROFILE AND HISTORY: Detailed assessment: Review of records and additional review  of physical, cognitive, psychosocial history related to current functional performance.  CLINICAL DECISION MAKING: Moderate - several treatment options, min-mod task modification necessary  REHAB POTENTIAL: Good  EVALUATION COMPLEXITY: Moderate    PLAN: OT FREQUENCY: 2x/week  OT DURATION: 12 weeks  PLANNED INTERVENTIONS: self care/ADL training, therapeutic exercise, therapeutic activity, neuromuscular re-education, manual therapy, passive range of motion, and paraffin  RECOMMENDED OTHER SERVICES: ST, and PT  CONSULTED AND AGREED WITH PLAN OF CARE: Patient  PLAN FOR NEXT SESSION:  Begin OT treatment sessions     Amaris Garrette T Samone Guhl, OTR/L, CLT  03/15/2022, 6:45 PM

## 2022-03-18 ENCOUNTER — Ambulatory Visit: Payer: Medicare HMO | Admitting: Speech Pathology

## 2022-03-18 ENCOUNTER — Ambulatory Visit: Payer: Medicare HMO

## 2022-03-20 ENCOUNTER — Encounter: Payer: Medicare HMO | Attending: Physical Medicine and Rehabilitation | Admitting: Physical Medicine and Rehabilitation

## 2022-03-20 VITALS — BP 135/62 | HR 74 | Ht 62.0 in | Wt 185.4 lb

## 2022-03-20 DIAGNOSIS — I63511 Cerebral infarction due to unspecified occlusion or stenosis of right middle cerebral artery: Secondary | ICD-10-CM | POA: Insufficient documentation

## 2022-03-20 DIAGNOSIS — E1149 Type 2 diabetes mellitus with other diabetic neurological complication: Secondary | ICD-10-CM | POA: Diagnosis not present

## 2022-03-20 NOTE — Progress Notes (Signed)
Subjective:    Patient ID: Alyssa Mayo, female    DOB: [birth year], 76 y.o.   MRN: [id number]  HPI Mrs. Alyssa Mayo is a 76 year old woman who presents for f/u of CVA and peripheral neuropathy 1) CVA -she has been able to walk much better -walked from parking lot to here without an assistive device -does not walk every day -she is following with Dr. Burnett Sheng.  2) HTN -BP is currently 135/62  3) Diabetic peripheral neuropathy -she is interested in Qutenza -her neuropathy is worst at night, when it can sometimes prevent her from sleeping    Pain Inventory Average Pain 0 Pain Right Now 0 My pain is intermittent, sharp, and aching  LOCATION OF PAIN  headache, back pain  BOWEL Number of stools per week: 3-4   BLADDER Normal and Pads   Mobility use a cane how many minutes can you walk? 10-15 minutes ability to climb steps?  yes do you drive?  no Do you have any goals in this area?  yes - Function retired I need assistance with the following:  dressing, bathing, meal prep, household duties, and shopping Do you have any goals in this area?  yes  Neuro/Psych weakness trouble walking  Prior Studies Any changes since last visit?  no  Physicians involved in your care Any changes since last visit?  no   Family History  Problem Relation Age of Onset   Cancer Mother        breast   Heart disease Mother    Stroke Mother    Diabetes Mother    Colon cancer Mother    Cancer Father        leukemia   Stroke Father    Leukemia Father    Ovarian cancer Sister    Diabetes Brother    Cancer Brother    Stroke Maternal Grandfather    Dementia Paternal Grandmother    COPD Neg Hx    Hypertension Neg Hx    Social History   Socioeconomic History   Marital status: Widowed    Spouse name: Not on file   Number of children: Not on file   Years of education: Not on file   Highest education level: Bachelor's degree (e.g., BA, AB, BS)  Occupational History   Not on  file  Tobacco Use   Smoking status: Never   Smokeless tobacco: Never  Vaping Use   Vaping Use: Never used  Substance and Sexual Activity   Alcohol use: Not Currently    Comment: none last 24 months   Drug use: No   Sexual activity: Not on file  Other Topics Concern   Not on file  Social History Narrative   Not on file   Social Determinants of Health   Financial Resource Strain: Low Risk  (12/10/2017)   Overall Financial Resource Strain (CARDIA)    Difficulty of Paying Living Expenses: Not hard at all  Food Insecurity: No Food Insecurity (12/10/2017)   Hunger Vital Sign    Worried About Running Out of Food in the Last Year: Never true    Ran Out of Food in the Last Year: Never true  Transportation Needs: No Transportation Needs (12/10/2017)   PRAPARE - Administrator, Civil Service (Medical): No    Lack of Transportation (Non-Medical): No  Physical Activity: Inactive (12/10/2017)   Exercise Vital Sign    Days of Exercise per Week: 0 days    Minutes of Exercise per Session: 0  min  Stress: No Stress Concern Present (12/10/2017)   [employer] of Occupational Health - Occupational Stress Questionnaire    Feeling of Stress : Not at all  Social Connections: Socially Integrated (12/10/2017)   Social Connection and Isolation Panel [NHANES]    Frequency of Communication with Friends and Family: More than three times a week    Frequency of Social Gatherings with Friends and Family: More than three times a week    Attends Religious Services: More than 4 times per year    Active Member of Clubs or Organizations: Yes    Attends Engineer, structural: More than 4 times per year    Marital Status: Married   Past Surgical History:  Procedure Laterality Date   ABDOMINAL HYSTERECTOMY     APPENDECTOMY     CATARACT EXTRACTION     CERVICAL LAMINECTOMY  07/21/1985   CESAREAN SECTION     COLONOSCOPY  07/21/2012   diverticulosis, [facility] Dr. Ricki Mayo    COLONOSCOPY WITH  PROPOFOL N/A 02/04/2019   Procedure: COLONOSCOPY WITH PROPOFOL;  Surgeon: Alyssa Deem, MD;  Location: [facility] ENDOSCOPY;  Service: Endoscopy;  Laterality: N/A;   COLONOSCOPY WITH PROPOFOL N/A 03/18/2021   Procedure: COLONOSCOPY WITH PROPOFOL;  Surgeon: [name], MD;  Location: [facility] ENDOSCOPY;  Service: Endoscopy;  Laterality: N/A;  DM   DIAGNOSTIC LAPAROSCOPY     ESOPHAGOGASTRODUODENOSCOPY (EGD) WITH PROPOFOL N/A 02/04/2019   Procedure: ESOPHAGOGASTRODUODENOSCOPY (EGD) WITH PROPOFOL;  Surgeon: Alyssa Deem, MD;  Location[facility] ENDOSCOPY;  Service: Endoscopy;  Laterality: N/A;   ESOPHAGOGASTRODUODENOSCOPY (EGD) WITH PROPOFOL N/A 03/18/2021   Procedure: ESOPHAGOGASTRODUODENOSCOPY (EGD) WITH PROPOFOL;  Surgeon: [name], MD;  Location: [facility] ENDOSCOPY;  Service: Endoscopy;  Laterality: N/A;   LOOP RECORDER INSERTION N/A 08/05/2021   Procedure: LOOP RECORDER INSERTION;  Surgeon: Alyssa Maw, MD;  Location: [facility] INVASIVE CV LAB;  Service: Cardiovascular;  Laterality: N/A;   SPINE SURGERY     TUBAL LIGATION     Past Medical History:  Diagnosis Date   Abnormal levels of other serum enzymes 07/17/2015   Arthritis    Constipation 07/11/2015   COVID-19 03/06/2021   Diabetes mellitus without complication ([facility])    Elevated liver enzymes    Fatty liver    Generalized abdominal pain 07/11/2015   Hyperlipidemia    Hypertension    Lifelong obesity    Macular degeneration    Morbid obesity due to excess calories ([facility]) 07/11/2015   Other fatigue 07/11/2015   Sleep apnea    Spinal headache    BP 135/62   Pulse 74   Ht 5\' 2"  (1.575 m)   Wt 185 lb 6.4 oz (84.1 kg)   SpO2 96%   BMI 33.91 kg/m   Opioid Risk Score:   Fall Risk Score:  `1  Depression screen [facility] 2/9     09/16/2021   11:25 AM 08/16/2020   10:15 AM 11/23/2019   10:25 AM 12/16/2018   10:47 AM 12/10/2017    1:09 PM 08/31/2017    9:33 AM 04/27/2017    3:44 PM  Depression screen PHQ 2/9  Decreased  Interest 0 0 0 0 0 0 0  Down, Depressed, Hopeless 0 0 0 0 0 0 0  PHQ - 2 Score 0 0 0 0 0 0 0  Altered sleeping 0        Tired, decreased energy 3        Change in appetite 3  Feeling bad or failure about yourself  0        Trouble concentrating 0        Moving slowly or fidgety/restless 0        Suicidal thoughts 0        PHQ-9 Score 6          Review of Systems  Musculoskeletal:  Positive for back pain and gait problem.  Neurological:  Positive for weakness (left hand) and headaches.  All other systems reviewed and are negative.      Objective:   Physical Exam Gen: no distress, normal appearing, BMI 33.91, BP 135/62, weight 185 lbs, accompanied by son HEENT: oral mucosa pink and moist, NCAT Cardio: Reg rate Chest: normal effort, normal rate of breathing Abd: soft, non-distended Ext: no edema Psych: pleasant, normal affect, flat Skin: intact, no open lesions on feet, +onchomychosis of toe nails Neuro: Alert and oriented x3     Assessment & Plan:   1) CVA -continue therapies -would benefit from handicap placard to increase mobility in the community -reviewed all medications and provided necessary refills.  2) Diabetic peripheral neuropathy -Discussed Qutenza as an option for neuropathic pain control. Discussed that this is a capsaicin patch, stronger than capsaicin cream. Discussed that it is currently approved for diabetic peripheral neuropathy and post-herpetic neuralgia, but that it has also shown benefit in treating other forms of neuropathy. Provided patient with link to site to learn more about the patch: https://www.clark.biz/. Discussed that the patch would be placed in office and benefits usually last 3 months. Discussed that unintended exposure to capsaicin can cause severe irritation of eyes, mucous membranes, respiratory tract, and skin, but that Qutenza is a local treatment and does not have the systemic side effects of other nerve medications. Discussed  that there may be pain, itching, erythema, and decreased sensory function associated with the application of Qutenza. Side effects usually subside within 1 week. A cold pack of analgesic medications can help with these side effects. Blood pressure can also be increased due to pain associated with administration of the patch.   4 patches of Qutenza was applied to the area of pain. Ice packs were applied during the procedure to ensure patient comfort. Blood pressure was monitored every 15 minutes. The patient tolerated the procedure well. Post-procedure instructions were given and follow-up has been scheduled.    3) Onchomycosis: mycostatin ordered.

## 2022-03-21 ENCOUNTER — Ambulatory Visit: Payer: Medicare HMO

## 2022-03-21 ENCOUNTER — Ambulatory Visit: Payer: Medicare HMO | Attending: Adult Health | Admitting: Speech Pathology

## 2022-03-21 DIAGNOSIS — R41841 Cognitive communication deficit: Secondary | ICD-10-CM | POA: Insufficient documentation

## 2022-03-21 DIAGNOSIS — R2681 Unsteadiness on feet: Secondary | ICD-10-CM | POA: Insufficient documentation

## 2022-03-21 DIAGNOSIS — R262 Difficulty in walking, not elsewhere classified: Secondary | ICD-10-CM | POA: Insufficient documentation

## 2022-03-21 DIAGNOSIS — M6281 Muscle weakness (generalized): Secondary | ICD-10-CM | POA: Insufficient documentation

## 2022-03-21 DIAGNOSIS — I63511 Cerebral infarction due to unspecified occlusion or stenosis of right middle cerebral artery: Secondary | ICD-10-CM | POA: Diagnosis present

## 2022-03-21 DIAGNOSIS — R278 Other lack of coordination: Secondary | ICD-10-CM | POA: Diagnosis present

## 2022-03-21 DIAGNOSIS — R482 Apraxia: Secondary | ICD-10-CM | POA: Diagnosis present

## 2022-03-21 MED ORDER — NYSTATIN 100000 UNIT/GM EX CREA: 1.0000 | TOPICAL_CREAM | Freq: Two times a day (BID) | CUTANEOUS | 0 refills | Status: DC
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
## 2022-04-01 ENCOUNTER — Ambulatory Visit: Payer: Medicare HMO | Admitting: Occupational Therapy

## 2022-04-01 ENCOUNTER — Ambulatory Visit: Payer: Medicare HMO | Admitting: Speech Pathology

## 2022-04-01 ENCOUNTER — Telehealth: Payer: Self-pay | Admitting: Speech Pathology

## 2022-04-01 NOTE — Therapy (Signed)
OUTPATIENT PHYSICAL THERAPY NEURO EVALUATION   Patient Name: Alyssa Mayo MRN: [id number] DOB:1946/03/30, 76 y.o., female Today's Date: 04/02/2022   PCP: [name], MD REFERRING PROVIDER: [name], NP   PT End of Session - 04/02/22 1055     Visit Number 1    Number of Visits 25    Date for PT Re-Evaluation 06/25/22    Authorization Time Period 04/02/2022-06/25/2022    PT Start Time 1101    PT Stop Time 1151    PT Time Calculation (min) 50 min    Equipment Utilized During Treatment Gait belt    Activity Tolerance Patient tolerated treatment well    Behavior During Therapy [facility] for tasks assessed/performed             Past Medical History:  Diagnosis Date   Abnormal levels of other serum enzymes 07/17/2015   Arthritis    Constipation 07/11/2015   COVID-19 03/06/2021   Diabetes mellitus without complication (HCC)    Elevated liver enzymes    Fatty liver    Generalized abdominal pain 07/11/2015   Hyperlipidemia    Hypertension    Lifelong obesity    Macular degeneration    Morbid obesity due to excess calories (HCC) 07/11/2015   Other fatigue 07/11/2015   Sleep apnea    Spinal headache    Past Surgical History:  Procedure Laterality Date   ABDOMINAL HYSTERECTOMY     APPENDECTOMY     CATARACT EXTRACTION     CERVICAL LAMINECTOMY  07/21/1985   CESAREAN SECTION     COLONOSCOPY  07/21/2012   diverticulosis, [facility] Dr. [name]    COLONOSCOPY WITH PROPOFOL N/A 02/04/2019   Procedure: COLONOSCOPY WITH PROPOFOL;  Surgeon: [name], MD;  Location: [facility] ENDOSCOPY;  Service: Endoscopy;  Laterality: N/A;   COLONOSCOPY WITH PROPOFOL N/A 03/18/2021   Procedure: COLONOSCOPY WITH PROPOFOL;  Surgeon: [name], MD;  Location: [facility] ENDOSCOPY;  Service: Endoscopy;  Laterality: N/A;  DM   DIAGNOSTIC LAPAROSCOPY     ESOPHAGOGASTRODUODENOSCOPY (EGD) WITH PROPOFOL N/A 02/04/2019   Procedure: ESOPHAGOGASTRODUODENOSCOPY (EGD) WITH PROPOFOL;  Surgeon:  [name], MD;  Location: [facility] ENDOSCOPY;  Service: Endoscopy;  Laterality: N/A;   ESOPHAGOGASTRODUODENOSCOPY (EGD) WITH PROPOFOL N/A 03/18/2021   Procedure: ESOPHAGOGASTRODUODENOSCOPY (EGD) WITH PROPOFOL;  Surgeon: [name], MD;  Location: [facility] ENDOSCOPY;  Service: Endoscopy;  Laterality: N/A;   LOOP RECORDER INSERTION N/A 08/05/2021   Procedure: LOOP RECORDER INSERTION;  Surgeon: [name], MD;  Location: [facility] INVASIVE CV LAB;  Service: Cardiovascular;  Laterality: N/A;   SPINE SURGERY     TUBAL LIGATION     Patient Active Problem List   Diagnosis Date Noted   Cerebral edema (HCC) 08/07/2021   OSA (obstructive sleep apnea) 08/07/2021   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   CVA (cerebral vascular accident) (HCC) 08/01/2021   GERD (gastroesophageal reflux disease) 07/20/2019   Advanced care planning/counseling discussion 12/17/2016   Skin lesions, generalized 11/13/2016   Chronic right hip pain 06/17/2016   Vitamin D deficiency 09/26/2015   Diastasis recti 08/08/2015   Elevated serum GGT level 07/24/2015   Essential hypertension 07/17/2015   Fatty liver 07/17/2015   Elevated alkaline phosphatase level 07/17/2015   Elevated serum glutamic pyruvic transaminase (SGPT) level 07/17/2015   Abnormal finding on EKG 07/11/2015   Morbid obesity (HCC) 07/11/2015   Colon, diverticulosis 07/11/2015   Abdominal wall hernia 07/11/2015   DM (diabetes mellitus), type 2 with neurological complications (HCC)  Hyperlipidemia     ONSET DATE: January 11th 2023  REFERRING DIAG:  I69.398,R26.9 (ICD-10-CM) - Gait disturbance, post-stroke  I63.511 (ICD-10-CM) - Right middle cerebral artery stroke (HCC)    THERAPY DIAG:  Muscle weakness (generalized)  Unsteadiness on feet  Other lack of coordination  Difficulty in walking, not elsewhere classified  Rationale for Evaluation and Treatment Rehabilitation  SUBJECTIVE:                                                                                                                                                                                               SUBJECTIVE STATEMENT: On Jan. 3rd 2023 pt reports she was diagnosed with pneumonia and was being treated for it. A few days later, on January 11th, pt had a stroke. She slipped that day getting out of her bed and fell on the floor. Her son took her to the doctor where she was then sent to the ER. Pt presents without AD today, but sometimes uses SPC, especially when on uneven ground. She now reports some difficulty with bed mobility: she has a bed rail and rubber mat near bed to prevent slipping. Pt no longer cooks because, she states, "I tend to leave the burner on." Pt reports she is not permitted to drive. Pt needs assistance with taking a shower, particularly with washing her hair. She has a shower-chair to help with this. She reports she is hesitant to bend to pick up items due to unsteadiness, and has difficulty reaching due to impairment of LUE. 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 "Sometimes I feel like I look like I'm tipsy." She reports reduced endurance and cannot walk for 10 min without resting, can stand for only 5-10 minutes. Doesn't use stairs much, and must hold onto rail.  Pt accompanied by: self  PERTINENT HISTORY: Per chart and confirmed by pt: Pt is a 76 yo female with RMCA CVA infarction with hemorrhagic transformation 08/01/2021, with inpatient rehab 08/07/2021-08/22/2021 followed by [facility] therapy until June. Pt now currently being seen for outpatient OT and ST services. Other PMH is significant for HTN, HLD, macular degeneration, DM, obesity, sleep apnea, arthritis, chronic R hip pain, vitamin D deficiency, diastasis recti, abdominal wall hernia, hx of abdominal hysterectomy, appendectomy, spine surgery (years ago).   PAIN:  Are you having pain? No Does experience chronic B shoulder pain first thing in the morning  that resolves quickly. Also with hx of chronic back pain (hx of back surgery).   PRECAUTIONS: Fall  WEIGHT BEARING RESTRICTIONS No  FALLS: Has patient fallen in last 6 months? No falls but has had  stumbles, L toe "catches" when walking.   LIVING ENVIRONMENT: Lives with: lives with their family and lives with their son, son has 3 dogs, must hold onto something near dogs sometimes try to jump on pt Lives in: House/apartment; 2 floors, but pt stays on first floor and has bedroom, bathroom, kitchen.  Stairs: Yes: External: 4 steps; bilateral but cannot reach both Has following equipment at home: Single point cane, shower chair, Grab bars, and standard walker, WC   PLOF: Independent  PATIENT GOALS  Build her endurance, improve her balance, would like to get back to cooking, improve bed mobility.  OBJECTIVE:   DIAGNOSTIC FINDINGS: per chart  08/01/2021 CT HEAD WO CONTRAST: "IMPRESSION: 1. Mass in the inferior right frontal lobe, with surrounding edema, with local mass effect in the right frontal lobe and on the right lateral ventricle, with approximately 3 mm of right-to-left midline shift. An MRI with and without contrast is recommended for further evaluation. 2. Additional hypodensity in the anterior right frontal lobe and right frontal parietal region are new from the prior exam and are technically age indeterminate but may indicate subacute infarcts."  08/01/2021 MR BRAIN W WO CONTRAST: "IMPRESSION: 1. Findings above consistent with early subacute infarcts in the right MCA distribution with involvement of the insula, lentiform and caudate nuclei, frontal, and parietal lobes with evidence of hemorrhagic transformation. Associated edema results in mild regional mass effect without midline shift. 2. No abnormal enhancement to suggest underlying mass lesion."  08/01/2021 CT ANGIO HEAD NECK W WO CMIMPRESSION: 1. Nonocclusive filling defect in the right MCA bifurcation, with irregular  and diminished flow in the inferior M2. 2. No other significant stenosis or large vessel occlusion. 3.  No hemodynamically significant stenosis in the neck."    COGNITION: Overall cognitive status: Within functional limits for tasks assessed but does report impaired memory since stroke   SENSATION: Reports impaired sensation in L hand, has difficulty buckling seatbelt because "can't find it.'  In tact to light touch in BLEs, reports no difference in sensation   COORDINATION: UE WNL when using RUE, difficulty/pain limited LUE  LE WNL B  EDEMA:  Mild swelling noted medial ankle B   POSTURE: rounded shoulders and increased thoracic kyphosis  LOWER EXTREMITY ROM:     deferred  LOWER EXTREMITY MMT:    Grossly 4/5 in BLE  BED MOBILITY: see above, mod I per pt report and has difficulty with rolling.  TRANSFERS: Assistive device utilized: None  Sit to stand: Complete Independence Stand to sit: Complete Independence    GAIT: Gait pattern: decreased arm swing- Left, decreased ankle dorsiflexion- Left, and decreased trunk rotation Distance walked: observed over 10 meters  Assistive device utilized: None Level of assistance: SBA Comments: pt also with decreased gait speed (See below)  FUNCTIONAL TESTs:  5 times sit to stand: 17 sec hands-free 10 meter walk test: 0.66 m/s without AD Berg: 50/56 M-CTSIB: able to complete all 4 conditions   PATIENT SURVEYS:  FOTO 57%   TODAY'S TREATMENT:  Instructed pt in HEP (see below) and had pt demo exercises within session.    PATIENT EDUCATION: Education details: exam findings, indications, plan, HEP Person educated: Patient Education method: Explanation, Demonstration, Verbal cues, and Handouts Education comprehension: verbalized understanding, returned demonstration, verbal cues required, and needs further education   HOME EXERCISE PROGRAM: Access Code: [email or web address] URL: [email or web address] Date:  04/02/2022 Prepared by: Temple Pacini  Exercises - Seated March  - 1 x daily - 5 x weekly - 3  sets - 15 reps - Standing Single Leg Stance with Counter Support  - 1 x daily - 7 x weekly - 2 sets - 2 reps - 30 seconds hold    GOALS: Goals reviewed with patient? Yes  SHORT TERM GOALS: Target date: 05/14/2022  Patient will be independent in home exercise program to improve strength/mobility for better functional independence with ADLs. Baseline: initiated Goal status: INITIAL   LONG TERM GOALS: Target date: 06/25/2022  Patient will increase FOTO score to equal to or greater than 65  to demonstrate improvement in mobility and quality of life.  Baseline: 57 Goal status: INITIAL  2.  Patient (> 45 years old) will complete five times sit to stand test in < 15 seconds indicating an increased LE strength and improved balance. Baseline: 17 sec hands-free Goal status: INITIAL  3.   Patient will increase 10 meter walk test to >1.71m/s as to improve gait speed for better community ambulation and to reduce fall risk. Baseline: 0.66 m/s Goal status: INITIAL  4.  Patient will increase Berg Balance score by > 3 points to demonstrate decreased fall risk during functional activities. Baseline: 50/56 Goal status: INITIAL  5.  Patient will increase six minute walk test distance to >1000 for progression to community ambulator and improve gait ability Baseline: deferred Goal status: INITIAL    ASSESSMENT:  CLINICAL IMPRESSION: Patient is a pleasant 76 y.o. female who was seen today for physical therapy evaluation and treatment following CVA Jan. 2023. Examination reveals impairments in balance, strength, gait and mobility, indicating pt is at increased risk for future falls. was not able to be completed today, but is to be tested next 1-2 sessions as pt reports impaired endurance since stroke. The pt will benefit from further skilled PT to improve these deficits in order to decrease fall risk  and increase functional mobility and QOL.   OBJECTIVE IMPAIRMENTS Abnormal gait, decreased activity tolerance, decreased balance, decreased coordination, decreased endurance, decreased mobility, difficulty walking, decreased strength, impaired sensation, impaired UE functional use, improper body mechanics, postural dysfunction, and pain.   ACTIVITY LIMITATIONS carrying, lifting, bending, standing, squatting, stairs, bed mobility, bathing, reach over head, hygiene/grooming, and locomotion level  PARTICIPATION LIMITATIONS: meal prep, cleaning, laundry, medication management, personal finances, driving, shopping, community activity, and yard work  PERSONAL FACTORS Age, Sex, Time since onset of injury/illness/exacerbation, and 3+ comorbidities: Other PMH is significant for HTN, HLD, macular degeneration, DM, obesity, sleep apnea, arthritis, chronic R hip pain, vitamin D deficiency, diastasis recti, abdominal wall hernia, hx of abdominal hysterectomy, appendectomy, spine surgery (years ago).   are also affecting patient's functional outcome.   REHAB POTENTIAL: Good  CLINICAL DECISION MAKING: Evolving/moderate complexity  EVALUATION COMPLEXITY: Moderate  PLAN: PT FREQUENCY: 2x/week  PT DURATION: 12 weeks  PLANNED INTERVENTIONS: Therapeutic exercises, Therapeutic activity, Neuromuscular re-education, Balance training, Gait training, Patient/Family education, Self Care, Joint mobilization, Joint manipulation, Stair training, Vestibular training, Canalith repositioning, Orthotic/Fit training, DME instructions, Dry Needling, Electrical stimulation, Wheelchair mobility training, Spinal mobilization, Cryotherapy, Moist heat, Splintting, Taping, Traction, Ultrasound, Biofeedback, Manual therapy, and Re-evaluation  PLAN FOR NEXT SESSION: , strength, balance, gait   Baird Kay, PT 04/02/2022, 2:59 PM

## 2022-04-01 NOTE — Telephone Encounter (Signed)
Pt missed her ST and OT sessions today.   I sent her son an email and he responded that he was not aware of her therapy sessions today. In a response, I replied with an additional email listing all scheduled appts for this week.   Harjit Leider B. Dreama Saa, M.S., CCC-SLP, Tree surgeon Certified Brain Injury Specialist [facility]  [facility] Services Office [phone] Ascom [phone] Fax [phone]

## 2022-04-02 ENCOUNTER — Ambulatory Visit: Payer: Medicare HMO | Admitting: Speech Pathology

## 2022-04-02 ENCOUNTER — Ambulatory Visit: Payer: Medicare HMO

## 2022-04-02 DIAGNOSIS — M6281 Muscle weakness (generalized): Secondary | ICD-10-CM

## 2022-04-02 DIAGNOSIS — R2681 Unsteadiness on feet: Secondary | ICD-10-CM

## 2022-04-02 DIAGNOSIS — R262 Difficulty in walking, not elsewhere classified: Secondary | ICD-10-CM

## 2022-04-02 DIAGNOSIS — R278 Other lack of coordination: Secondary | ICD-10-CM

## 2022-04-02 DIAGNOSIS — R41841 Cognitive communication deficit: Secondary | ICD-10-CM | POA: Diagnosis not present

## 2022-04-03 ENCOUNTER — Ambulatory Visit: Payer: Medicare HMO | Admitting: Speech Pathology

## 2022-04-03 ENCOUNTER — Ambulatory Visit: Payer: Medicare HMO | Admitting: Occupational Therapy

## 2022-04-03 ENCOUNTER — Encounter: Payer: Self-pay | Admitting: Occupational Therapy

## 2022-04-03 ENCOUNTER — Encounter: Payer: Medicare HMO | Admitting: Occupational Therapy

## 2022-04-03 DIAGNOSIS — R41841 Cognitive communication deficit: Secondary | ICD-10-CM | POA: Diagnosis not present

## 2022-04-03 DIAGNOSIS — I63511 Cerebral infarction due to unspecified occlusion or stenosis of right middle cerebral artery: Secondary | ICD-10-CM

## 2022-04-03 DIAGNOSIS — R278 Other lack of coordination: Secondary | ICD-10-CM

## 2022-04-03 DIAGNOSIS — R482 Apraxia: Secondary | ICD-10-CM

## 2022-04-03 NOTE — Therapy (Signed)
OUTPATIENT OCCUPATIONAL THERAPY TREATMENT NOTE  Patient Name: Alyssa Mayo MRN: [id number] DOB:07-06-1946, 76 y.o., female Today's Date: 03/14/2022  PCP: [name], MD REFERRING PROVIDER: [name], NP   OT End of Session - 04/03/22 1305     Visit Number 5    Number of Visits 24    Date for OT Re-Evaluation 05/27/22    Authorization Type Progress reporting period starting 03/04/2022    OT Start Time 1300    OT Stop Time 1345    OT Time Calculation (min) 45 min    Activity Tolerance Patient tolerated treatment well    Behavior During Therapy [facility] for tasks assessed/performed             Past Medical History:  Diagnosis Date   Abnormal levels of other serum enzymes 07/17/2015   Arthritis    Constipation 07/11/2015   COVID-19 03/06/2021   Diabetes mellitus without complication (HCC)    Elevated liver enzymes    Fatty liver    Generalized abdominal pain 07/11/2015   Hyperlipidemia    Hypertension    Lifelong obesity    Macular degeneration    Morbid obesity due to excess calories (HCC) 07/11/2015   Other fatigue 07/11/2015   Sleep apnea    Spinal headache    Past Surgical History:  Procedure Laterality Date   ABDOMINAL HYSTERECTOMY     APPENDECTOMY     CATARACT EXTRACTION     CERVICAL LAMINECTOMY  07/21/1985   CESAREAN SECTION     COLONOSCOPY  07/21/2012   diverticulosis, [facility] Dr. [name]    COLONOSCOPY WITH PROPOFOL N/A 02/04/2019   Procedure: COLONOSCOPY WITH PROPOFOL;  Surgeon: [name], MD;  Location: [facility] ENDOSCOPY;  Service: Endoscopy;  Laterality: N/A;   COLONOSCOPY WITH PROPOFOL N/A 03/18/2021   Procedure: COLONOSCOPY WITH PROPOFOL;  Surgeon: [name], MD;  Location: [facility] ENDOSCOPY;  Service: Endoscopy;  Laterality: N/A;  DM   DIAGNOSTIC LAPAROSCOPY     ESOPHAGOGASTRODUODENOSCOPY (EGD) WITH PROPOFOL N/A 02/04/2019   Procedure: ESOPHAGOGASTRODUODENOSCOPY (EGD) WITH PROPOFOL;  Surgeon: [name], MD;  Location:  [facility] ENDOSCOPY;  Service: Endoscopy;  Laterality: N/A;   ESOPHAGOGASTRODUODENOSCOPY (EGD) WITH PROPOFOL N/A 03/18/2021   Procedure: ESOPHAGOGASTRODUODENOSCOPY (EGD) WITH PROPOFOL;  Surgeon: [name], MD;  Location: [facility] ENDOSCOPY;  Service: Endoscopy;  Laterality: N/A;   LOOP RECORDER INSERTION N/A 08/05/2021   Procedure: LOOP RECORDER INSERTION;  Surgeon: [name], MD;  Location: [facility] INVASIVE CV LAB;  Service: Cardiovascular;  Laterality: N/A;   SPINE SURGERY     TUBAL LIGATION     Patient Active Problem List   Diagnosis Date Noted   Cerebral edema (HCC) 08/07/2021   OSA (obstructive sleep apnea) 08/07/2021   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   CVA (cerebral vascular accident) (HCC) 08/01/2021   GERD (gastroesophageal reflux disease) 07/20/2019   Advanced care planning/counseling discussion 12/17/2016   Skin lesions, generalized 11/13/2016   Chronic right hip pain 06/17/2016   Vitamin D deficiency 09/26/2015   Diastasis recti 08/08/2015   Elevated serum GGT level 07/24/2015   Essential hypertension 07/17/2015   Fatty liver 07/17/2015   Elevated alkaline phosphatase level 07/17/2015   Elevated serum glutamic pyruvic transaminase (SGPT) level 07/17/2015   Abnormal finding on EKG 07/11/2015   Morbid obesity (HCC) 07/11/2015   Colon, diverticulosis 07/11/2015   Abdominal wall hernia 07/11/2015   DM (diabetes mellitus), type 2 with neurological complications (HCC)    Hyperlipidemia     ONSET DATE:  08/01/2021  REFERRING DIAG: CVA  THERAPY DIAG:  Other lack of coordination  Right middle cerebral artery stroke (HCC)  Apraxia  Rationale for Evaluation and Treatment Rehabilitation  SUBJECTIVE:   SUBJECTIVE STATEMENT:  Pt reports her son and 3 grandkids visited.   Pt accompanied by: self  PERTINENT HISTORY:  Pt. Is a 76 y.o. female  presents with a diagnosis of RMCA CVA infarction with hemorrhagic transformation. Pt. Attended inpatient rehab  from 08/07/2021-08/22/2021. Pt. received Home health therapy services this past spring.  Pt. Has recently had an assessment through driver rehabilitation services at [facility] with recommendations for referrals for  outpatient OT/PT, and ST services. Pt. PMHx includes: HTN, Hyperlipidemia, Macular degeneration, DM, and Obesity.  PRECAUTIONS: None  WEIGHT BEARING RESTRICTIONS No  PAIN:  Are you having pain? Pt reports a dull ache throughout the LUE; 1-2/10  FALLS: Has patient fallen in last 6 months? Yes, 1 fall out of bed at the time of the stroke, and one fall out of bed just after returning home from the hospital.   LIVING ENVIRONMENT: Lives with: lives with their family  Son  [name] Lives in: House/apartment Main living are on one floor Stairs:  2 steps to enter Has following equipment at home: Single point cane, Wheelchair (manual), shower chair, Grab bars, bed rail, and rubber mat.  PLOF: Independent  PATIENT GOALS: To be able to Drive, and cook again  OBJECTIVE:   HAND DOMINANCE: Right   ADLs: Overall ADLs:  Transfers/ambulation related to ADLs: Independent Eating: Independent Grooming: Pt. Fatigues with sustained BUE's in elevation for haircare. Especially with blow drying hair. UB Dressing: Independent pullover shirt, independent now with fastening a bra LB Dressing: Independent donning pants socks, and slide on shoes Toileting: Independent Bathing: Independent Tub Shower transfers: Walk-in shower Independent now   IADLs: Shopping: Needs to be accompanied to the grocery store. Light housekeeping: Does own laundry, laundry,  Meal Prep:  Difficulty with cooking Community mobility: Relies on son, and friend Medication management: Son sets up Mining engineer management: Manages monthly bills independently. Handwriting: No change from baseline  MOBILITY STATUS: Hx of falls out of bed   ACTIVITY TOLERANCE: Activity tolerance:  10-20 min. Before rest break  FUNCTIONAL  OUTCOME MEASURES: FOTO: 57  TR score: 62  UPPER EXTREMITY ROM     Active ROM Right eval Left eval  Shoulder flexion [facility] 96(110)  Shoulder abduction [facility] 82(105)  Shoulder adduction    Shoulder extension    Shoulder internal rotation    Shoulder external rotation    Elbow flexion [facility] WFL  Elbow extension [facility]  Wrist flexion    Wrist extension WFL 44(60  Wrist ulnar deviation WFL 32(60)  Wrist radial deviation    Wrist pronation    Wrist supination    (Blank rows = not tested)   UPPER EXTREMITY MMT:     MMT Right eval Left eval  Shoulder flexion 4+/5 3-/5  Shoulder abduction 4+/5 3-/5  Shoulder adduction    Shoulder extension    Shoulder internal rotation    Shoulder external rotation    Middle trapezius    Lower trapezius    Elbow flexion 4+/5 4-/5  Elbow extension 4+/5 4-/5  Wrist flexion 4+/5 3-/5  Wrist extension 4+/5 3/5  Wrist ulnar deviation    Wrist radial deviation    Wrist pronation    Wrist supination    (Blank rows = not tested)  HAND FUNCTION: Grip strength: Right: 30 lbs; Left: 13 lbs, Lateral  pinch: Right: 15 lbs, Left: 8 lbs, and 3 point pinch: Right: 13 lbs, Left: 7 lbs  COORDINATION: 9 Hole Peg test: Right: 26  sec; Left: 1 min. & 9 sec  SENSATION: Light touch: WFL Proprioception: WFL  COGNITION: Overall cognitive status: Within functional limits for tasks assessed  VISION: Subjective report: Glasses. Just received a new prescription to increase bifocal strength Baseline vision: Macular Degeneration Visual history: macular degeneration  VISION ASSESSMENT: To be further assessed in functional context  Left sided Awareness    PERCEPTION: Limited left sided awareness   TODAY'S TREATMENT:    Neuromuscular Reeducation: Pt completed [place] game with focus on holding 6-10 stones in L hand while translating one at a time to fingertips to place in shallow bowl. Increased difficulty removing 8-10 stones from bowl without dropping.  Pt worked on [facility] skills using tweezers to place hollow beads on peg board, requires beads set up vertically on table and use of R hand to stabilize peg board. Improved precision picking up beads when placed at midline, increased difficulty when placed ~6 inches L of midline. Completed light-brite activity focused on manipulating pegs in L hand for placement following a pattern.    PATIENT EDUCATION: Education details: Kitchen safety/use of external memory aids to turn off burners, limit distractions when in kitchen Person educated: Patient Education method: verbal cues Education comprehension: verbalized understanding and pt does not plan to cook unless son is present.   HOME EXERCISE PROGRAM:  Continue with an ongoing assessment of HEP needs    GOALS: Goals reviewed with patient? Yes   SHORT TERM GOALS: Target date:  04/15/2022      Pt. Will improve FOTO score by 2 points to reflect improved pt. perceived functional performance Baseline: FOTO: 57, TR score: 62 Goal status: INITIAL  LONG TERM GOALS: Target date: 05/27/2022    Pt. Will improve left UE strength by 2 mm grades to assist with repositioning her grandson on her lap. Baseline: Eval: left shoulder flexion: 3-/5, abduction 3-/5,  elbow flex/ext. 4-/5, wrist extension 3/5, wrist flexion 3-/5  Goal status: INITIAL  2.  Pt. will improve left grip strength by 5# to be able to independently hold and use a blow dryer, and brush for hair care. Baseline: Eval: Left grip strength 13#. Pt. Has difficulty performing hair care, holding and using a brush, and blow dryer. Goal status: INITIAL  3.  Pt. Will improve left lateral pinch strength by 3# to be able to independently cut meat. Baseline: Eval: Left 8, Pt. Has difficulty cutting meat Goal status: INITIAL  4.  Pt. Will improve Left hand [facility] skills to be able to be able to independently, and efficiently manipulate small objects during ADL tasks. Baseline: Eval: left: 1 min. 9  sec. Goal status: INITIAL  5.  Pt. Will perform light meal preparation with modified independence Baseline: Eval: Pt. Reports having difficulty performing light  meal preparation, and cooking tasks. Goal status: INITIAL  ASSESSMENT:  CLINICAL IMPRESSION: Pt continues to progress with left hand skills. Pt improving with translatory skills of the L hand, holding 10 mancala stones in palm while bringing one to fingertips. Focused on precision and pinch strength using tweezers for small peg board, cues to use wrist not shoulder to adjust position of peg. Continues to have difficulty locating objects placed L of midline. She continues to benefit from skilled OT services to maximize safety and independence in necessary daily tasks.     PERFORMANCE DEFICITS in functional skills including ADLs,  IADLs, coordination, dexterity, sensation, ROM, strength, FMC, decreased knowledge of use of DME, vision, and UE functional use, cognitive skills including attention, and psychosocial skills including coping strategies, environmental adaptation, habits, and routines and behaviors.   IMPAIRMENTS are limiting patient from ADLs, IADLs, and social participation.   COMORBIDITIES may have co-morbidities  that affects occupational performance. Patient will benefit from skilled OT to address above impairments and improve overall function.  MODIFICATION OR ASSISTANCE TO COMPLETE EVALUATION: Min-Moderate modification of tasks or assist with assess necessary to complete an evaluation.  OT OCCUPATIONAL PROFILE AND HISTORY: Detailed assessment: Review of records and additional review of physical, cognitive, psychosocial history related to current functional performance.  CLINICAL DECISION MAKING: Moderate - several treatment options, min-mod task modification necessary  REHAB POTENTIAL: Good  EVALUATION COMPLEXITY: Moderate    PLAN: OT FREQUENCY: 2x/week  OT DURATION: 12 weeks  PLANNED INTERVENTIONS: self  care/ADL training, therapeutic exercise, therapeutic activity, neuromuscular re-education, manual therapy, passive range of motion, and paraffin  RECOMMENDED OTHER SERVICES: ST, and PT  CONSULTED AND AGREED WITH PLAN OF CARE: Patient  PLAN FOR NEXT SESSION:  L hand strengthening and coordination exercises    Kathie Dike, M.S. OTR/L  04/03/22, 1:07 PM  ascom [phone]

## 2022-04-05 LAB — CUP PACEART REMOTE DEVICE CHECK
Date Time Interrogation Session: 20230908142617
Implantable Pulse Generator Implant Date: 20230116

## 2022-04-07 ENCOUNTER — Ambulatory Visit (INDEPENDENT_AMBULATORY_CARE_PROVIDER_SITE_OTHER): Payer: Medicare HMO

## 2022-04-07 DIAGNOSIS — I639 Cerebral infarction, unspecified: Secondary | ICD-10-CM

## 2022-04-08 ENCOUNTER — Ambulatory Visit: Payer: Medicare HMO | Admitting: Occupational Therapy

## 2022-04-08 ENCOUNTER — Encounter: Payer: Medicare HMO | Admitting: Speech Pathology

## 2022-04-08 ENCOUNTER — Ambulatory Visit: Payer: Medicare HMO | Admitting: Physical Therapy

## 2022-04-09 ENCOUNTER — Ambulatory Visit: Payer: Medicare HMO | Admitting: Speech Pathology

## 2022-04-10 ENCOUNTER — Ambulatory Visit: Payer: Medicare HMO

## 2022-04-10 ENCOUNTER — Ambulatory Visit: Payer: Medicare HMO | Admitting: Occupational Therapy

## 2022-04-10 ENCOUNTER — Encounter: Payer: Self-pay | Admitting: Occupational Therapy

## 2022-04-10 DIAGNOSIS — I63511 Cerebral infarction due to unspecified occlusion or stenosis of right middle cerebral artery: Secondary | ICD-10-CM

## 2022-04-10 DIAGNOSIS — R482 Apraxia: Secondary | ICD-10-CM

## 2022-04-10 DIAGNOSIS — R262 Difficulty in walking, not elsewhere classified: Secondary | ICD-10-CM

## 2022-04-10 DIAGNOSIS — M6281 Muscle weakness (generalized): Secondary | ICD-10-CM

## 2022-04-10 DIAGNOSIS — R278 Other lack of coordination: Secondary | ICD-10-CM

## 2022-04-10 DIAGNOSIS — R2681 Unsteadiness on feet: Secondary | ICD-10-CM

## 2022-04-10 DIAGNOSIS — R41841 Cognitive communication deficit: Secondary | ICD-10-CM | POA: Diagnosis not present

## 2022-04-10 NOTE — Therapy (Addendum)
OUTPATIENT PHYSICAL THERAPY NEURO TREATMENT   Patient Name: Alyssa Mayo MRN: [id number] DOB:[birth year], 76 y.o., female Today's Date: 04/10/2022   PCP: [place] Pink, MD REFERRING PROVIDER: Frann Rider, NP   PT End of Session - 04/10/22 1023     Visit Number 2    Number of Visits 25    Date for PT Re-Evaluation 06/25/22    Authorization Type [employer]    Authorization Time Period 04/02/2022-06/25/2022    PT Start Time 1020    PT Stop Time 1100    PT Time Calculation (min) 40 min    Equipment Utilized During Treatment Gait belt    Activity Tolerance Patient tolerated treatment well;No increased pain    Behavior During Therapy [facility] for tasks assessed/performed             Past Medical History:  Diagnosis Date   Abnormal levels of other serum enzymes 07/17/2015   Arthritis    Constipation 07/11/2015   COVID-19 03/06/2021   Diabetes mellitus without complication ([place])    Elevated liver enzymes    Fatty liver    Generalized abdominal pain 07/11/2015   Hyperlipidemia    Hypertension    Lifelong obesity    Macular degeneration    Morbid obesity due to excess calories ([place]) 07/11/2015   Other fatigue 07/11/2015   Sleep apnea    Spinal headache    Past Surgical History:  Procedure Laterality Date   ABDOMINAL HYSTERECTOMY     APPENDECTOMY     CATARACT EXTRACTION     CERVICAL LAMINECTOMY  07/21/1985   CESAREAN SECTION     COLONOSCOPY  07/21/2012   diverticulosis, [facility] Dr. Donnella Sham    COLONOSCOPY WITH PROPOFOL N/A 02/04/2019   Procedure: COLONOSCOPY WITH PROPOFOL;  Surgeon: Lollie Sails, MD;  Location: [facility] ENDOSCOPY;  Service: Endoscopy;  Laterality: N/A;   COLONOSCOPY WITH PROPOFOL N/A 03/18/2021   Procedure: COLONOSCOPY WITH PROPOFOL;  Surgeon: Lesly Rubenstein, MD;  Location: [facility] ENDOSCOPY;  Service: Endoscopy;  Laterality: N/A;  DM   DIAGNOSTIC LAPAROSCOPY     ESOPHAGOGASTRODUODENOSCOPY (EGD) WITH PROPOFOL N/A 02/04/2019   Procedure:  ESOPHAGOGASTRODUODENOSCOPY (EGD) WITH PROPOFOL;  Surgeon: Lollie Sails, MD;  Location: [facility] ENDOSCOPY;  Service: Endoscopy;  Laterality: N/A;   ESOPHAGOGASTRODUODENOSCOPY (EGD) WITH PROPOFOL N/A 03/18/2021   Procedure: ESOPHAGOGASTRODUODENOSCOPY (EGD) WITH PROPOFOL;  Surgeon: Lesly Rubenstein, MD;  Location: [facility] ENDOSCOPY;  Service: Endoscopy;  Laterality: N/A;   LOOP RECORDER INSERTION N/A 08/05/2021   Procedure: LOOP RECORDER INSERTION;  Surgeon: Evans Lance, MD;  Location: [place] CV LAB;  Service: Cardiovascular;  Laterality: N/A;   [place]     TUBAL LIGATION     Patient Active Problem List   Diagnosis Date Noted   Cerebral edema ([place]) 08/07/2021   OSA (obstructive sleep apnea) 08/07/2021   Right middle cerebral artery stroke ([place]) 08/07/2021   CVA (cerebral vascular accident) ([place]) 08/01/2021   GERD (gastroesophageal reflux disease) 07/20/2019   Advanced care planning/counseling discussion 12/17/2016   Skin lesions, generalized 11/13/2016   Chronic right hip pain 06/17/2016   Vitamin D deficiency 09/26/2015   Diastasis recti 08/08/2015   Elevated serum GGT level 07/24/2015   Essential hypertension 07/17/2015   Fatty liver 07/17/2015   Elevated alkaline phosphatase level 07/17/2015   Elevated serum glutamic pyruvic transaminase (SGPT) level 07/17/2015   Abnormal finding on EKG 07/11/2015   Morbid obesity ([place]) 07/11/2015   Colon, diverticulosis 07/11/2015   Abdominal wall hernia 07/11/2015   DM (diabetes  mellitus), type 2 with neurological complications [facility])    Hyperlipidemia     ONSET DATE: January 11th 2023  REFERRING DIAG:  I69.398,R26.9 (ICD-10-CM) - Gait disturbance, post-stroke  I63.511 (ICD-10-CM) - Right middle cerebral artery stroke (HCC)    THERAPY DIAG:  Other lack of coordination  Right middle cerebral artery stroke (HCC)  Apraxia  Muscle weakness (generalized)  Unsteadiness on feet  Difficulty in walking, not elsewhere  classified  Rationale for Evaluation and Treatment Rehabilitation  SUBJECTIVE:                                                                                                                                                                                              SUBJECTIVE STATEMENT: Pt in [place], [place] last week due to her brother being hospitalized, did a lot of walking to access hospital. She reports feeling good today, no pain. She worked on her HEP while at the hospital.   Pt accompanied by: self  PERTINENT HISTORY: Per chart and confirmed by pt: Pt is a 76 yo female with RMCA CVA infarction with hemorrhagic transformation 08/01/2021, with inpatient rehab 08/07/2021-08/22/2021 followed by [facility] therapy until June. Pt now currently being seen for outpatient OT and ST services. Pt reports limitations in stamina persist. Other PMH is significant for HTN, HLD, macular degeneration, DM, obesity, sleep apnea, arthritis, chronic R hip pain, vitamin D deficiency, diastasis recti, abdominal wall hernia, hx of abdominal hysterectomy, appendectomy, spine surgery (years ago).   PAIN:  Are you having pain? No Does experience chronic B shoulder pain first thing in the morning that resolves quickly. Also with hx of chronic back pain (hx of back surgery).   PRECAUTIONS: Fall  WEIGHT BEARING RESTRICTIONS No  FALLS: Has patient fallen in last 6 months? No falls but has had stumbles, L toe "catches" when walking.   LIVING ENVIRONMENT: Lives with: lives with their family and lives with their son, son has 3 dogs, must hold onto something near dogs sometimes try to jump on pt Lives in: House/apartment; 2 floors, but pt stays on first floor and has bedroom, bathroom, kitchen.  Stairs: Yes: External: 4 steps; bilateral but cannot reach both Has following equipment at home: Single point cane, shower chair, Grab bars, and standard walker, WC   PLOF: Independent  PATIENT GOALS  Build her endurance, improve  her balance, would like to get back to cooking, improve bed mobility.    TODAY'S TREATMENT:  -6MWT: 645ft (pain in right greater troch at 5 minutes)  -STS from chair, hands on knees 2x10  -SLS 3x30sec at support bar prn  -Obstacle course with throw  rug, red mat with hidden objects, firm step up/down, foam step up/down,     PATIENT EDUCATION: Education details: exam findings, indications, plan, HEP Person educated: Patient Education method: Consulting civil engineer, Demonstration, Verbal cues, and Handouts Education comprehension: verbalized understanding, returned demonstration, verbal cues required, and needs further education   HOME EXERCISE PROGRAM: Access Code: [email or web address] URL: [email or web address] Date: 04/02/2022 Prepared by: Ricard Dillon  Exercises - Seated March  - 1 x daily - 5 x weekly - 3 sets - 15 reps - Standing Single Leg Stance with Counter Support  - 1 x daily - 7 x weekly - 2 sets - 2 reps - 30 seconds hold    GOALS: Goals reviewed with patient? Yes  SHORT TERM GOALS: Target date: 05/22/2022  Patient will be independent in home exercise program to improve strength/mobility for better functional independence with ADLs. Baseline: initiated Goal status: INITIAL   LONG TERM GOALS: Target date: 07/03/2022  Patient will increase FOTO score to equal to or greater than 65  to demonstrate improvement in mobility and quality of life.  Baseline: 57 Goal status: INITIAL  2.  Patient (> 16 years old) will complete five times sit to stand test in < 15 seconds indicating an increased LE strength and improved balance. Baseline: 17 sec hands-free Goal status: INITIAL  3.   Patient will increase 10 meter walk test to >1.2m/s as to improve gait speed for better community ambulation and to reduce fall risk. Baseline: 0.66 m/s Goal status: INITIAL  4.  Patient will increase Berg Balance score by > 3 points to demonstrate decreased fall risk during functional  activities. Baseline: 50/56 Goal status: INITIAL  5.  Patient will increase six minute walk test distance to >1000 for progression to community ambulator and improve gait ability Baseline: 04/10/22: 67ft (pain onset at 5 minutes)  Goal status: INITIAL    ASSESSMENT:  CLINICAL IMPRESSION: HEP warrants no review. 6MWT completed. Balance interventions commenced. Good tolerance overall. Exacerbation of Rt trochanteric pain with sustained walking. Education on building a regular walking program at home. Obstacle course was pretty fun. Pt will benefit from PT services to return to baseline.    OBJECTIVE IMPAIRMENTS Abnormal gait, decreased activity tolerance, decreased balance, decreased coordination, decreased endurance, decreased mobility, difficulty walking, decreased strength, impaired sensation, impaired UE functional use, improper body mechanics, postural dysfunction, and pain.   ACTIVITY LIMITATIONS carrying, lifting, bending, standing, squatting, stairs, bed mobility, bathing, reach over head, hygiene/grooming, and locomotion level  PARTICIPATION LIMITATIONS: meal prep, cleaning, laundry, medication management, personal finances, driving, shopping, community activity, and yard work  PERSONAL FACTORS Age, Sex, Time since onset of injury/illness/exacerbation, and 3+ comorbidities: Other PMH is significant for HTN, HLD, macular degeneration, DM, obesity, sleep apnea, arthritis, chronic R hip pain, vitamin D deficiency, diastasis recti, abdominal wall hernia, hx of abdominal hysterectomy, appendectomy, spine surgery (years ago).   are also affecting patient's functional outcome.   REHAB POTENTIAL: Good  CLINICAL DECISION MAKING: Evolving/moderate complexity  EVALUATION COMPLEXITY: Moderate  PLAN: PT FREQUENCY: 2x/week  PT DURATION: 12 weeks  PLANNED INTERVENTIONS: Therapeutic exercises, Therapeutic activity, Neuromuscular re-education, Balance training, Gait training, Patient/Family  education, Self Care, Joint mobilization, Joint manipulation, Stair training, Vestibular training, Canalith repositioning, Orthotic/Fit training, DME instructions, Dry Needling, Electrical stimulation, Wheelchair mobility training, Spinal mobilization, Cryotherapy, Moist heat, Splintting, Taping, Traction, Ultrasound, Biofeedback, Manual therapy, and Re-evaluation  PLAN FOR NEXT SESSION: strength, balance, gait   Hayli Milligan C, PT 04/10/2022, 10:37 AM   10:37 AM, 04/10/22 Etta Grandchild, PT,  DPT Physical Therapist - [facility]  [phone] [facility])

## 2022-04-10 NOTE — Therapy (Signed)
OUTPATIENT OCCUPATIONAL THERAPY TREATMENT NOTE  Patient Name: Alyssa Mayo MRN: [id number] DOB:03/14/1946, 76 y.o., female Today's Date: 03/14/2022  PCP: [place] Pink, MD REFERRING PROVIDER: Frann Rider, NP   OT End of Session - 04/10/22 1053     Visit Number 6    Number of Visits 24    Date for OT Re-Evaluation 05/27/22    Authorization Type Progress reporting period starting 03/04/2022    OT Start Time 1100    OT Stop Time 1145    OT Time Calculation (min) 45 min    Activity Tolerance Patient tolerated treatment well    Behavior During Therapy [facility] for tasks assessed/performed             Past Medical History:  Diagnosis Date   Abnormal levels of other serum enzymes 07/17/2015   Arthritis    Constipation 07/11/2015   COVID-19 03/06/2021   Diabetes mellitus without complication ([place])    Elevated liver enzymes    Fatty liver    Generalized abdominal pain 07/11/2015   Hyperlipidemia    Hypertension    Lifelong obesity    Macular degeneration    Morbid obesity due to excess calories ([place]) 07/11/2015   Other fatigue 07/11/2015   Sleep apnea    Spinal headache    Past Surgical History:  Procedure Laterality Date   ABDOMINAL HYSTERECTOMY     APPENDECTOMY     CATARACT EXTRACTION     CERVICAL LAMINECTOMY  07/21/1985   CESAREAN SECTION     COLONOSCOPY  07/21/2012   diverticulosis, [facility] Dr. Donnella Sham    COLONOSCOPY WITH PROPOFOL N/A 02/04/2019   Procedure: COLONOSCOPY WITH PROPOFOL;  Surgeon: Lollie Sails, MD;  Location: [facility] ENDOSCOPY;  Service: Endoscopy;  Laterality: N/A;   COLONOSCOPY WITH PROPOFOL N/A 03/18/2021   Procedure: COLONOSCOPY WITH PROPOFOL;  Surgeon: Lesly Rubenstein, MD;  Location: [facility] ENDOSCOPY;  Service: Endoscopy;  Laterality: N/A;  DM   DIAGNOSTIC LAPAROSCOPY     ESOPHAGOGASTRODUODENOSCOPY (EGD) WITH PROPOFOL N/A 02/04/2019   Procedure: ESOPHAGOGASTRODUODENOSCOPY (EGD) WITH PROPOFOL;  Surgeon: Lollie Sails, MD;  Location:  [facility] ENDOSCOPY;  Service: Endoscopy;  Laterality: N/A;   ESOPHAGOGASTRODUODENOSCOPY (EGD) WITH PROPOFOL N/A 03/18/2021   Procedure: ESOPHAGOGASTRODUODENOSCOPY (EGD) WITH PROPOFOL;  Surgeon: Lesly Rubenstein, MD;  Location: [facility] ENDOSCOPY;  Service: Endoscopy;  Laterality: N/A;   LOOP RECORDER INSERTION N/A 08/05/2021   Procedure: LOOP RECORDER INSERTION;  Surgeon: Evans Lance, MD;  Location: [place] CV LAB;  Service: Cardiovascular;  Laterality: N/A;   SPINE SURGERY     TUBAL LIGATION     Patient Active Problem List   Diagnosis Date Noted   Cerebral edema ([place]) 08/07/2021   OSA (obstructive sleep apnea) 08/07/2021   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   CVA (cerebral vascular accident) ([place]) 08/01/2021   GERD (gastroesophageal reflux disease) 07/20/2019   Advanced care planning/counseling discussion 12/17/2016   Skin lesions, generalized 11/13/2016   Chronic right hip pain 06/17/2016   Vitamin D deficiency 09/26/2015   Diastasis recti 08/08/2015   Elevated serum GGT level 07/24/2015   Essential hypertension 07/17/2015   Fatty liver 07/17/2015   Elevated alkaline phosphatase level 07/17/2015   Elevated serum glutamic pyruvic transaminase (SGPT) level 07/17/2015   Abnormal finding on EKG 07/11/2015   Morbid obesity ([place]) 07/11/2015   Colon, diverticulosis 07/11/2015   Abdominal wall hernia 07/11/2015   DM (diabetes mellitus), type 2 with neurological complications ([place])    Hyperlipidemia     ONSET DATE:  08/01/2021  REFERRING DIAG: CVA  THERAPY DIAG:  Other lack of coordination  Muscle weakness (generalized)  Apraxia  Rationale for Evaluation and Treatment Rehabilitation  SUBJECTIVE:   SUBJECTIVE STATEMENT:  Pt reports she just finished staying with her brother who has 5 cats and is now back with her son who has 3 dogs - reports dogs may jump on her in the mornings and she has to stabilize against the wall.   Pt accompanied by:  self  PERTINENT HISTORY:  Pt. Is a 76 y.o. female  presents with a diagnosis of RMCA CVA infarction with hemorrhagic transformation. Pt. Attended inpatient rehab from 08/07/2021-08/22/2021. Pt. received Home health therapy services this past spring.  Pt. Has recently had an assessment through driver rehabilitation services at [facility] with recommendations for referrals for  outpatient OT/PT, and ST services. Pt. PMHx includes: HTN, Hyperlipidemia, Macular degeneration, DM, and Obesity.  PRECAUTIONS: None  WEIGHT BEARING RESTRICTIONS No  PAIN:  Are you having pain? Pt reports a dull ache in L hip; 1/10  FALLS: Has patient fallen in last 6 months? Yes, 1 fall out of bed at the time of the stroke, and one fall out of bed just after returning home from the hospital.   LIVING ENVIRONMENT: Lives with: lives with their family  Son  [name] Lives in: House/apartment Main living are on one floor Stairs:  2 steps to enter Has following equipment at home: Single point cane, Wheelchair (manual), shower chair, Grab bars, bed rail, and rubber mat.  PLOF: Independent  PATIENT GOALS: To be able to Drive, and cook again  OBJECTIVE:   HAND DOMINANCE: Right   ADLs: Overall ADLs:  Transfers/ambulation related to ADLs: Independent Eating: Independent Grooming: Pt. Fatigues with sustained BUE's in elevation for haircare. Especially with blow drying hair. UB Dressing: Independent pullover shirt, independent now with fastening a bra LB Dressing: Independent donning pants socks, and slide on shoes Toileting: Independent Bathing: Independent Tub Shower transfers: Walk-in shower Independent now   IADLs: Shopping: Needs to be accompanied to the grocery store. Light housekeeping: Does own laundry, laundry,  Meal Prep:  Difficulty with cooking Community mobility: Relies on son, and friend Medication management: Son sets up Mining engineer management: Manages monthly bills independently. Handwriting: No change  from baseline  MOBILITY STATUS: Hx of falls out of bed   ACTIVITY TOLERANCE: Activity tolerance:  10-20 min. Before rest break  FUNCTIONAL OUTCOME MEASURES: FOTO: 57  TR score: 62  UPPER EXTREMITY ROM     Active ROM Right eval Left eval  Shoulder flexion [facility] 96(110)  Shoulder abduction [facility] 82(105)  Shoulder adduction    Shoulder extension    Shoulder internal rotation    Shoulder external rotation    Elbow flexion [facility] WFL  Elbow extension [facility]  Wrist flexion    Wrist extension WFL 44(60  Wrist ulnar deviation WFL 32(60)  Wrist radial deviation    Wrist pronation    Wrist supination    (Blank rows = not tested)   UPPER EXTREMITY MMT:     MMT Right eval Left eval  Shoulder flexion 4+/5 3-/5  Shoulder abduction 4+/5 3-/5  Shoulder adduction    Shoulder extension    Shoulder internal rotation    Shoulder external rotation    Middle trapezius    Lower trapezius    Elbow flexion 4+/5 4-/5  Elbow extension 4+/5 4-/5  Wrist flexion 4+/5 3-/5  Wrist extension 4+/5 3/5  Wrist ulnar deviation    Wrist radial  deviation    Wrist pronation    Wrist supination    (Blank rows = not tested)  HAND FUNCTION: Grip strength: Right: 30 lbs; Left: 13 lbs, Lateral pinch: Right: 15 lbs, Left: 8 lbs, and 3 point pinch: Right: 13 lbs, Left: 7 lbs  COORDINATION: 9 Hole Peg test: Right: 26  sec; Left: 1 min. & 9 sec  SENSATION: Light touch: WFL Proprioception: WFL  COGNITION: Overall cognitive status: Within functional limits for tasks assessed  VISION: Subjective report: Glasses. Just received a new prescription to increase bifocal strength Baseline vision: Macular Degeneration Visual history: macular degeneration  VISION ASSESSMENT: To be further assessed in functional context  Left sided Awareness    PERCEPTION: Limited left sided awareness   TODAY'S TREATMENT:    Therapeutic Exercise: Pt performed gross gripping with a gross grip strengthener. Pt worked  on sustaining grip while grasping pegs to place into pegboard. The ripper was et to  7.1  # of grip strength resistance (yellow spring first hole). Pt fatigued quickly and required rest breaks.    Therapeutic Activity:  Pt completed finger strengthening and dexterity task placing push pins onto resistive cork board using L hand. Pt focused on reaching across midline to grasp pins from small tupperware and manipulating pin in hand without hiking L shoulder. Pt worked on L hand [facility] skills grasping 1/8" hollow beads. Pt worked on storing the objects in the palm and translatory skills moving the items from the palm of the hand to the tip of the 2nd digit and thumb. Cues to prevent bead falling off of ulnar side of palm. Pt worked on threading beads onto toothpick. Pt worked on grasping coins from a tabletop surface, placing them into a resistive container, and pushing them through the slot while isolating his 2nd digit.     PATIENT EDUCATION: Education details: Kitchen safety/use of external memory aids to turn off burners, limit distractions when in kitchen Person educated: Patient Education method: verbal cues Education comprehension: verbalized understanding and pt does not plan to cook unless son is present.   HOME EXERCISE PROGRAM:  Continue with an ongoing assessment of HEP needs    GOALS: Goals reviewed with patient? Yes   SHORT TERM GOALS: Target date:  04/15/2022      Pt. Will improve FOTO score by 2 points to reflect improved pt. perceived functional performance Baseline: FOTO: 57, TR score: 62 Goal status: INITIAL  LONG TERM GOALS: Target date: 05/27/2022    Pt. Will improve left UE strength by 2 mm grades to assist with repositioning her grandson on her lap. Baseline: Eval: left shoulder flexion: 3-/5, abduction 3-/5,  elbow flex/ext. 4-/5, wrist extension 3/5, wrist flexion 3-/5  Goal status: INITIAL  2.  Pt. will improve left grip strength by 5# to be able to  independently hold and use a blow dryer, and brush for hair care. Baseline: Eval: Left grip strength 13#. Pt. Has difficulty performing hair care, holding and using a brush, and blow dryer. Goal status: INITIAL  3.  Pt. Will improve left lateral pinch strength by 3# to be able to independently cut meat. Baseline: Eval: Left 8, Pt. Has difficulty cutting meat Goal status: INITIAL  4.  Pt. Will improve Left hand [facility] skills to be able to be able to independently, and efficiently manipulate small objects during ADL tasks. Baseline: Eval: left: 1 min. 9 sec. Goal status: INITIAL  5.  Pt. Will perform light meal preparation with modified independence Baseline: Eval: Pt.  Reports having difficulty performing light  meal preparation, and cooking tasks. Goal status: INITIAL  ASSESSMENT:  CLINICAL IMPRESSION: Pt improving with translatory skills of the L hand, moving 1/8" beads from palm to fingertips. Pt fatigues quickly completing grip strengthening activities requiring rest breaks, tolerates 7.1# on gripper. Continues to have difficulty locating objects placed L of midline. She continues to benefit from skilled OT services to maximize safety and independence in necessary daily tasks.     PERFORMANCE DEFICITS in functional skills including ADLs, IADLs, coordination, dexterity, sensation, ROM, strength, FMC, decreased knowledge of use of DME, vision, and UE functional use, cognitive skills including attention, and psychosocial skills including coping strategies, environmental adaptation, habits, and routines and behaviors.   IMPAIRMENTS are limiting patient from ADLs, IADLs, and social participation.   COMORBIDITIES may have co-morbidities  that affects occupational performance. Patient will benefit from skilled OT to address above impairments and improve overall function.  MODIFICATION OR ASSISTANCE TO COMPLETE EVALUATION: Min-Moderate modification of tasks or assist with assess necessary to  complete an evaluation.  OT OCCUPATIONAL PROFILE AND HISTORY: Detailed assessment: Review of records and additional review of physical, cognitive, psychosocial history related to current functional performance.  CLINICAL DECISION MAKING: Moderate - several treatment options, min-mod task modification necessary  REHAB POTENTIAL: Good  EVALUATION COMPLEXITY: Moderate    PLAN: OT FREQUENCY: 2x/week  OT DURATION: 12 weeks  PLANNED INTERVENTIONS: self care/ADL training, therapeutic exercise, therapeutic activity, neuromuscular re-education, manual therapy, passive range of motion, and paraffin  RECOMMENDED OTHER SERVICES: ST, and PT  CONSULTED AND AGREED WITH PLAN OF CARE: Patient  PLAN FOR NEXT SESSION:  L hand strengthening and coordination exercises    Kathie Dike, M.S. OTR/L  04/10/22, 10:54 AM  ascom [phone]

## 2022-04-14 ENCOUNTER — Encounter: Payer: Medicare HMO | Admitting: Occupational Therapy

## 2022-04-14 ENCOUNTER — Encounter: Payer: Medicare HMO | Admitting: Speech Pathology

## 2022-04-14 ENCOUNTER — Ambulatory Visit: Payer: Medicare HMO | Admitting: Physical Therapy

## 2022-04-16 ENCOUNTER — Ambulatory Visit: Payer: Medicare HMO | Admitting: Physical Therapy

## 2022-04-16 ENCOUNTER — Encounter: Payer: Medicare HMO | Admitting: Occupational Therapy

## 2022-04-18 NOTE — Progress Notes (Signed)
Carelink Summary Report / Loop Recorder 

## 2022-04-21 ENCOUNTER — Ambulatory Visit: Payer: Medicare HMO | Admitting: Occupational Therapy

## 2022-04-21 ENCOUNTER — Ambulatory Visit: Payer: Medicare HMO | Admitting: Physical Therapy

## 2022-04-21 ENCOUNTER — Ambulatory Visit: Payer: Medicare HMO

## 2022-04-21 ENCOUNTER — Ambulatory Visit: Payer: Medicare HMO | Attending: Adult Health | Admitting: Speech Pathology

## 2022-04-21 DIAGNOSIS — R41841 Cognitive communication deficit: Secondary | ICD-10-CM | POA: Diagnosis present

## 2022-04-21 DIAGNOSIS — R278 Other lack of coordination: Secondary | ICD-10-CM

## 2022-04-21 DIAGNOSIS — R2681 Unsteadiness on feet: Secondary | ICD-10-CM | POA: Insufficient documentation

## 2022-04-21 DIAGNOSIS — R262 Difficulty in walking, not elsewhere classified: Secondary | ICD-10-CM | POA: Diagnosis present

## 2022-04-21 DIAGNOSIS — E119 Type 2 diabetes mellitus without complications: Secondary | ICD-10-CM | POA: Insufficient documentation

## 2022-04-21 DIAGNOSIS — I63511 Cerebral infarction due to unspecified occlusion or stenosis of right middle cerebral artery: Secondary | ICD-10-CM | POA: Insufficient documentation

## 2022-04-21 DIAGNOSIS — R482 Apraxia: Secondary | ICD-10-CM

## 2022-04-21 DIAGNOSIS — M6281 Muscle weakness (generalized): Secondary | ICD-10-CM | POA: Diagnosis present

## 2022-04-21 NOTE — Therapy (Signed)
OUTPATIENT OCCUPATIONAL THERAPY TREATMENT NOTE  Patient Name: Alyssa Mayo MRN: [id number] DOB:Dec 25, 1945, 76 y.o., female Today's Date: 03/14/2022  PCP: Alyssa Mina, MD REFERRING PROVIDER: [name], NP   OT End of Session - 04/21/22 1018     Visit Number 7    Number of Visits 24    Date for OT Re-Evaluation 05/27/22    Authorization Type Progress reporting period starting 03/04/2022    OT Start Time 1016    OT Stop Time 1100    OT Time Calculation (min) 44 min    Activity Tolerance Patient tolerated treatment well    Behavior During Therapy [facility] for tasks assessed/performed             Past Medical History:  Diagnosis Date   Abnormal levels of other serum enzymes 07/17/2015   Arthritis    Constipation 07/11/2015   COVID-19 03/06/2021   Diabetes mellitus without complication (HCC)    Elevated liver enzymes    Fatty liver    Generalized abdominal pain 07/11/2015   Hyperlipidemia    Hypertension    Lifelong obesity    Macular degeneration    Morbid obesity due to excess calories (HCC) 07/11/2015   Other fatigue 07/11/2015   Sleep apnea    Spinal headache    Past Surgical History:  Procedure Laterality Date   ABDOMINAL HYSTERECTOMY     APPENDECTOMY     CATARACT EXTRACTION     CERVICAL LAMINECTOMY  07/21/1985   CESAREAN SECTION     COLONOSCOPY  07/21/2012   diverticulosis, [facility] Dr. Ricki Mayo    COLONOSCOPY WITH PROPOFOL N/A 02/04/2019   Procedure: COLONOSCOPY WITH PROPOFOL;  Surgeon: Alyssa Deem, MD;  Location: [facility] ENDOSCOPY;  Service: Endoscopy;  Laterality: N/A;   COLONOSCOPY WITH PROPOFOL N/A 03/18/2021   Procedure: COLONOSCOPY WITH PROPOFOL;  Surgeon: Alyssa Bill, MD;  Location: [facility] ENDOSCOPY;  Service: Endoscopy;  Laterality: N/A;  DM   DIAGNOSTIC LAPAROSCOPY     ESOPHAGOGASTRODUODENOSCOPY (EGD) WITH PROPOFOL N/A 02/04/2019   Procedure: ESOPHAGOGASTRODUODENOSCOPY (EGD) WITH PROPOFOL;  Surgeon: Alyssa Deem, MD;  Location:  [facility] ENDOSCOPY;  Service: Endoscopy;  Laterality: N/A;   ESOPHAGOGASTRODUODENOSCOPY (EGD) WITH PROPOFOL N/A 03/18/2021   Procedure: ESOPHAGOGASTRODUODENOSCOPY (EGD) WITH PROPOFOL;  Surgeon: Alyssa Bill, MD;  Location: [facility] ENDOSCOPY;  Service: Endoscopy;  Laterality: N/A;   LOOP RECORDER INSERTION N/A 08/05/2021   Procedure: LOOP RECORDER INSERTION;  Surgeon: Alyssa Maw, MD;  Location: [facility] INVASIVE CV LAB;  Service: Cardiovascular;  Laterality: N/A;   SPINE SURGERY     TUBAL LIGATION     Patient Active Problem List   Diagnosis Date Noted   Cerebral edema (HCC) 08/07/2021   OSA (obstructive sleep apnea) 08/07/2021   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   CVA (cerebral vascular accident) (HCC) 08/01/2021   GERD (gastroesophageal reflux disease) 07/20/2019   Advanced care planning/counseling discussion 12/17/2016   Skin lesions, generalized 11/13/2016   Chronic right hip pain 06/17/2016   Vitamin D deficiency 09/26/2015   Diastasis recti 08/08/2015   Elevated serum GGT level 07/24/2015   Essential hypertension 07/17/2015   Fatty liver 07/17/2015   Elevated alkaline phosphatase level 07/17/2015   Elevated serum glutamic pyruvic transaminase (SGPT) level 07/17/2015   Abnormal finding on EKG 07/11/2015   Morbid obesity (HCC) 07/11/2015   Colon, diverticulosis 07/11/2015   Abdominal wall hernia 07/11/2015   DM (diabetes mellitus), type 2 with neurological complications (HCC)    Hyperlipidemia     ONSET DATE:  08/01/2021  REFERRING DIAG: CVA  THERAPY DIAG:  Muscle weakness (generalized)  Rationale for Evaluation and Treatment Rehabilitation  SUBJECTIVE:   SUBJECTIVE STATEMENT:   Pt. reports that she is doing well today.  Pt accompanied by: self  PERTINENT HISTORY:  Pt. Is a 76 y.o. female  presents with a diagnosis of RMCA CVA infarction with hemorrhagic transformation. Pt. Attended inpatient rehab from 08/07/2021-08/22/2021. Pt. received Home health  therapy services this past spring.  Pt. Has recently had an assessment through driver rehabilitation services at [facility] with recommendations for referrals for  outpatient OT/PT, and ST services. Pt. PMHx includes: HTN, Hyperlipidemia, Macular degeneration, DM, and Obesity.  PRECAUTIONS: None  WEIGHT BEARING RESTRICTIONS No  PAIN:  Are you having pain? Pt reports a dull ache in L shoulder 3/10  FALLS: Has patient fallen in last 6 months? Yes, 1 fall out of bed at the time of the stroke, and one fall out of bed just after returning home from the hospital.   LIVING ENVIRONMENT: Lives with: lives with their family  Son  Alyssa Mayo Lives in: House/apartment Main living are on one floor Stairs:  2 steps to enter Has following equipment at home: Single point cane, Wheelchair (manual), shower chair, Grab bars, bed rail, and rubber mat.  PLOF: Independent  PATIENT GOALS: To be able to Drive, and cook again  OBJECTIVE:   HAND DOMINANCE: Right   ADLs: Overall ADLs:  Transfers/ambulation related to ADLs: Independent Eating: Independent Grooming: Pt. Fatigues with sustained BUE's in elevation for haircare. Especially with blow drying hair. UB Dressing: Independent pullover shirt, independent now with fastening a bra LB Dressing: Independent donning pants socks, and slide on shoes Toileting: Independent Bathing: Independent Tub Shower transfers: Walk-in shower Independent now   IADLs: Shopping: Needs to be accompanied to the grocery store. Light housekeeping: Does own laundry, laundry,  Meal Prep:  Difficulty with cooking Community mobility: Relies on son, and friend Medication management: Son sets up Building surveyor management: Manages monthly bills independently. Handwriting: No change from baseline  MOBILITY STATUS: Hx of falls out of bed   ACTIVITY TOLERANCE: Activity tolerance:  10-20 min. Before rest break  FUNCTIONAL OUTCOME MEASURES: FOTO: 57  TR score: 62  UPPER EXTREMITY  ROM     Active ROM Right eval Left eval  Shoulder flexion [facility] 96(110)  Shoulder abduction [facility] 82(105)  Shoulder adduction    Shoulder extension    Shoulder internal rotation    Shoulder external rotation    Elbow flexion [facility] WFL  Elbow extension [facility] WFL  Wrist flexion    Wrist extension WFL 44(60  Wrist ulnar deviation WFL 32(60)  Wrist radial deviation    Wrist pronation    Wrist supination    (Blank rows = not tested)   UPPER EXTREMITY MMT:     MMT Right eval Left eval  Shoulder flexion 4+/5 3-/5  Shoulder abduction 4+/5 3-/5  Shoulder adduction    Shoulder extension    Shoulder internal rotation    Shoulder external rotation    Middle trapezius    Lower trapezius    Elbow flexion 4+/5 4-/5  Elbow extension 4+/5 4-/5  Wrist flexion 4+/5 3-/5  Wrist extension 4+/5 3/5  Wrist ulnar deviation    Wrist radial deviation    Wrist pronation    Wrist supination    (Blank rows = not tested)  HAND FUNCTION: Grip strength: Right: 30 lbs; Left: 13 lbs, Lateral pinch: Right: 15 lbs, Left: 8 lbs, and 3 point  pinch: Right: 13 lbs, Left: 7 lbs  COORDINATION: 9 Hole Peg test: Right: 26  sec; Left: 1 min. & 9 sec  SENSATION: Light touch: WFL Proprioception: WFL  COGNITION: Overall cognitive status: Within functional limits for tasks assessed  VISION: Subjective report: Glasses. Just received a new prescription to increase bifocal strength Baseline vision: Macular Degeneration Visual history: macular degeneration  VISION ASSESSMENT: To be further assessed in functional context  Left sided Awareness    PERCEPTION: Limited left sided awareness   TODAY'S TREATMENT:    Therapeutic Exercise:  Pt performed left hand  gross gripping with a gross grip strengthener. Pt worked on sustaining grip while grasping pegs to place into pegboard. The ripper was et to 6.6 # of grip strength resistance. Pt. Was able to clear the board, and place the pegs into a container placed  at the left of the board on the tabletop. Pt. Worked on pinch strengthening in the left hand for lateral, and 3pt. pinch using yellow, red, and resistive clips. Pt. worked on placing the clips at various vertical and horizontal angles. Tactile and verbal cues were required for eliciting the desired movement.   Neuromuscular re-education:   Pt. worked on left hand [facility] skills grasping coins, performing translatory movements moving them through her palm to the tip of his 2nd digit, and thumb in preparation for stacking them, and pressing them through the container.   Pt.is making progress, and in now engaging her left hand during more tasks. Pt. Reports consistently using her left hand now to hold onto rails. Pt. Presents with 3/10 left UE pain the started a few days ago. Pt. Reports pain with shoulder shoulder flexion, and horizontal abduction. Pt. Tolerated grip strengthening well after adjusting resistance level from 11.9# initially to 6.6#. Pt. Was bale to complete 3pt. ,and lateral pinch with cues for hand position. Pt. Continues to work on improving left UE strength, and coordination skills in order to work towards improving engagement of her LUE in, and maximizing independence with ADLs, and IADLs.    PATIENT EDUCATION: Education details: Kitchen safety/use of external memory aids to turn off burners, limit distractions when in kitchen Person educated: Patient Education method: verbal cues Education comprehension: verbalized understanding and pt does not plan to cook unless son is present.   HOME EXERCISE PROGRAM:  Continue with an ongoing assessment of HEP needs    GOALS: Goals reviewed with patient? Yes   SHORT TERM GOALS: Target date:  04/15/2022      Pt. Will improve FOTO score by 2 points to reflect improved pt. perceived functional performance Baseline: FOTO: 57, TR score: 62 Goal status: INITIAL  LONG TERM GOALS: Target date: 05/27/2022    Pt. Will improve left UE  strength by 2 mm grades to assist with repositioning her grandson on her lap. Baseline: Eval: left shoulder flexion: 3-/5, abduction 3-/5,  elbow flex/ext. 4-/5, wrist extension 3/5, wrist flexion 3-/5  Goal status: INITIAL  2.  Pt. will improve left grip strength by 5# to be able to independently hold and use a blow dryer, and brush for hair care. Baseline: Eval: Left grip strength 13#. Pt. Has difficulty performing hair care, holding and using a brush, and blow dryer. Goal status: INITIAL  3.  Pt. Will improve left lateral pinch strength by 3# to be able to independently cut meat. Baseline: Eval: Left 8, Pt. Has difficulty cutting meat Goal status: INITIAL  4.  Pt. Will improve Left hand [facility] skills to be able  to be able to independently, and efficiently manipulate small objects during ADL tasks. Baseline: Eval: left: 1 min. 9 sec. Goal status: INITIAL  5.  Pt. Will perform light meal preparation with modified independence Baseline: Eval: Pt. Reports having difficulty performing light  meal preparation, and cooking tasks. Goal status: INITIAL  ASSESSMENT:  CLINICAL IMPRESSION:  Pt.is making progress, and in now engaging her left hand during more tasks. Pt. Reports consistently using her left hand now to hold onto rails. Pt. Presents with 3/10 left UE pain the started a few days ago. Pt. Reports pain with shoulder shoulder flexion, and horizontal abduction. Pt. Tolerated grip strengthening well after adjusting resistance level from 11.9# initially to 6.6#. Pt. Was bale to complete 3pt. ,and lateral pinch with cues for hand position. Pt. Continues to work on improving left UE strength, and coordination skills in order to work towards improving engagement of her LUE in, and maximizing independence with ADLs, and IADLs.  PERFORMANCE DEFICITS in functional skills including ADLs, IADLs, coordination, dexterity, sensation, ROM, strength, FMC, decreased knowledge of use of DME, vision, and UE  functional use, cognitive skills including attention, and psychosocial skills including coping strategies, environmental adaptation, habits, and routines and behaviors.   IMPAIRMENTS are limiting patient from ADLs, IADLs, and social participation.   COMORBIDITIES may have co-morbidities  that affects occupational performance. Patient will benefit from skilled OT to address above impairments and improve overall function.  MODIFICATION OR ASSISTANCE TO COMPLETE EVALUATION: Min-Moderate modification of tasks or assist with assess necessary to complete an evaluation.  OT OCCUPATIONAL PROFILE AND HISTORY: Detailed assessment: Review of records and additional review of physical, cognitive, psychosocial history related to current functional performance.  CLINICAL DECISION MAKING: Moderate - several treatment options, min-mod task modification necessary  REHAB POTENTIAL: Good  EVALUATION COMPLEXITY: Moderate    PLAN: OT FREQUENCY: 2x/week  OT DURATION: 12 weeks  PLANNED INTERVENTIONS: self care/ADL training, therapeutic exercise, therapeutic activity, neuromuscular re-education, manual therapy, passive range of motion, and paraffin  RECOMMENDED OTHER SERVICES: ST, and PT  CONSULTED AND AGREED WITH PLAN OF CARE: Patient  PLAN FOR NEXT SESSION:  L hand strengthening and coordination exercises    Olegario Messier, MS, OTR/L

## 2022-04-21 NOTE — Therapy (Signed)
OUTPATIENT PHYSICAL THERAPY NEURO TREATMENT   Patient Name: Alyssa Mayo MRN: [id number] DOB:September 30, 1945, 76 y.o., female Today's Date: 04/21/2022   PCP: [name], MD REFERRING PROVIDER: [name], NP   PT End of Session - 04/21/22 0918     Visit Number 3    Number of Visits 25    Date for PT Re-Evaluation 06/25/22    Authorization Type [employer]    Authorization Time Period 04/02/2022-06/25/2022    Progress Note Due on Visit 10    PT Start Time 0915    PT Stop Time 0955    PT Time Calculation (min) 40 min    Equipment Utilized During Treatment Gait belt    Activity Tolerance Patient tolerated treatment well;Patient limited by pain    Behavior During Therapy [facility] for tasks assessed/performed             Past Medical History:  Diagnosis Date   Abnormal levels of other serum enzymes 07/17/2015   Arthritis    Constipation 07/11/2015   COVID-19 03/06/2021   Diabetes mellitus without complication (HCC)    Elevated liver enzymes    Fatty liver    Generalized abdominal pain 07/11/2015   Hyperlipidemia    Hypertension    Lifelong obesity    Macular degeneration    Morbid obesity due to excess calories (HCC) 07/11/2015   Other fatigue 07/11/2015   Sleep apnea    Spinal headache    Past Surgical History:  Procedure Laterality Date   ABDOMINAL HYSTERECTOMY     APPENDECTOMY     CATARACT EXTRACTION     CERVICAL LAMINECTOMY  07/21/1985   CESAREAN SECTION     COLONOSCOPY  07/21/2012   diverticulosis, [facility] Dr. [name]    COLONOSCOPY WITH PROPOFOL N/A 02/04/2019   Procedure: COLONOSCOPY WITH PROPOFOL;  Surgeon: [name], MD;  Location: [facility] ENDOSCOPY;  Service: Endoscopy;  Laterality: N/A;   COLONOSCOPY WITH PROPOFOL N/A 03/18/2021   Procedure: COLONOSCOPY WITH PROPOFOL;  Surgeon: [name], MD;  Location: [facility] ENDOSCOPY;  Service: Endoscopy;  Laterality: N/A;  DM   DIAGNOSTIC LAPAROSCOPY     ESOPHAGOGASTRODUODENOSCOPY (EGD) WITH  PROPOFOL N/A 02/04/2019   Procedure: ESOPHAGOGASTRODUODENOSCOPY (EGD) WITH PROPOFOL;  Surgeon: [name], MD;  Location: [facility] ENDOSCOPY;  Service: Endoscopy;  Laterality: N/A;   ESOPHAGOGASTRODUODENOSCOPY (EGD) WITH PROPOFOL N/A 03/18/2021   Procedure: ESOPHAGOGASTRODUODENOSCOPY (EGD) WITH PROPOFOL;  Surgeon: [name], MD;  Location: [facility] ENDOSCOPY;  Service: Endoscopy;  Laterality: N/A;   LOOP RECORDER INSERTION N/A 08/05/2021   Procedure: LOOP RECORDER INSERTION;  Surgeon: [name], MD;  Location: [facility] INVASIVE CV LAB;  Service: Cardiovascular;  Laterality: N/A;   SPINE SURGERY     TUBAL LIGATION     Patient Active Problem List   Diagnosis Date Noted   Cerebral edema (HCC) 08/07/2021   OSA (obstructive sleep apnea) 08/07/2021   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   CVA (cerebral vascular accident) (HCC) 08/01/2021   GERD (gastroesophageal reflux disease) 07/20/2019   Advanced care planning/counseling discussion 12/17/2016   Skin lesions, generalized 11/13/2016   Chronic right hip pain 06/17/2016   Vitamin D deficiency 09/26/2015   Diastasis recti 08/08/2015   Elevated serum GGT level 07/24/2015   Essential hypertension 07/17/2015   Fatty liver 07/17/2015   Elevated alkaline phosphatase level 07/17/2015   Elevated serum glutamic pyruvic transaminase (SGPT) level 07/17/2015   Abnormal finding on EKG 07/11/2015   Morbid obesity (HCC) 07/11/2015   Colon, diverticulosis 07/11/2015  Abdominal wall hernia 07/11/2015   DM (diabetes mellitus), type 2 with neurological complications [facility])    Hyperlipidemia     ONSET DATE: January 11th 2023  REFERRING DIAG:  I69.398,R26.9 (ICD-10-CM) - Gait disturbance, post-stroke  I63.511 (ICD-10-CM) - Right middle cerebral artery stroke (HCC)    THERAPY DIAG:  Other lack of coordination  Muscle weakness (generalized)  Apraxia  Right middle cerebral artery stroke (HCC)  Unsteadiness on  feet  Difficulty in walking, not elsewhere classified  Rationale for Evaluation and Treatment Rehabilitation  SUBJECTIVE:                                                                                                                                                                                              SUBJECTIVE STATEMENT: Pt at the beach last week, worked on her HEP some, but largely was limited by the affects of climbing stairs repeatedly on her left knee. Today knee is painful 5/10, hence pt is using SPC. Pt reports she and her college friends walked for 6 minutes 3x.   Pt accompanied by: self  PERTINENT HISTORY: Per chart and confirmed by pt: Pt is a 76 yo female with RMCA CVA infarction with hemorrhagic transformation 08/01/2021, with inpatient rehab 08/07/2021-08/22/2021 followed by [facility] therapy until June. Pt now currently being seen for outpatient OT and ST services. Pt reports limitations in stamina persist. Other PMH is significant for HTN, HLD, macular degeneration, DM, obesity, sleep apnea, arthritis, chronic R hip pain, vitamin D deficiency, diastasis recti, abdominal wall hernia, hx of abdominal hysterectomy, appendectomy, spine surgery (years ago).   PAIN:  Are you having pain? Yes; 5/10 Left knee     TODAY'S TREATMENT:  -Seated Left heel slides x20 on towel to reduce acute knee pain -AA/ROM on Nustep to warmup acutely painful knee, seat 7, Rt arm 10, Left arm left off due to shoulder pain aggravation -Rt SLS at treadmill bar x4x30sec (attempt on LLE with increased pain in left knee)  -at steps, alternate ANT step taps x20, hands ad lib, no LOB -lateral SPC step overs at // bar x20 -lateral step overs at bar c half foam roller x20  -4-square stepping pattern on red mat x2 minutes  -backward walking, no device 46ft     PATIENT EDUCATION: Education details: exam findings, indications, plan, HEP Person educated: Patient Education method: Programmer, multimedia, Demonstration,  Verbal cues, and Handouts Education comprehension: verbalized understanding, returned demonstration, verbal cues required, and needs further education   HOME EXERCISE PROGRAM: Access Code: [email or web address] URL: [email or web address] Date: 04/02/2022 Prepared by: Temple Pacini  Exercises - Seated March  - 1 x daily -  5 x weekly - 3 sets - 15 reps - Standing Single Leg Stance with Counter Support  - 1 x daily - 7 x weekly - 2 sets - 2 reps - 30 seconds hold    GOALS: Goals reviewed with patient? Yes  SHORT TERM GOALS: Target date: 06/02/2022  Patient will be independent in home exercise program to improve strength/mobility for better functional independence with ADLs. Baseline: initiated Goal status: INITIAL   LONG TERM GOALS: Target date: 07/14/2022  Patient will increase FOTO score to equal to or greater than 65  to demonstrate improvement in mobility and quality of life.  Baseline: 57 Goal status: INITIAL  2.  Patient (> 84 years old) will complete five times sit to stand test in < 15 seconds indicating an increased LE strength and improved balance. Baseline: 17 sec hands-free Goal status: INITIAL  3.   Patient will increase 10 meter walk test to >1.42m/s as to improve gait speed for better community ambulation and to reduce fall risk. Baseline: 0.66 m/s Goal status: INITIAL  4.  Patient will increase Berg Balance score by > 3 points to demonstrate decreased fall risk during functional activities. Baseline: 50/56 Goal status: INITIAL  5.  Patient will increase six minute walk test distance to >1000 for progression to community ambulator and improve gait ability Baseline: 04/10/22: 693ft (pain onset at 5 minutes)  Goal status: INITIAL    ASSESSMENT:  CLINICAL IMPRESSION: Session limited by acute on chronic pain in left knee s/p vacation trip, hence additional interventions aimed at maximizing tolerance to session. We are able to modify activities for reduced  knee pain aggravation. Pt continues along with balance training and generalized strengthening. Pt will benefit from PT services to return to baseline.    OBJECTIVE IMPAIRMENTS Abnormal gait, decreased activity tolerance, decreased balance, decreased coordination, decreased endurance, decreased mobility, difficulty walking, decreased strength, impaired sensation, impaired UE functional use, improper body mechanics, postural dysfunction, and pain.   ACTIVITY LIMITATIONS carrying, lifting, bending, standing, squatting, stairs, bed mobility, bathing, reach over head, hygiene/grooming, and locomotion level  PARTICIPATION LIMITATIONS: meal prep, cleaning, laundry, medication management, personal finances, driving, shopping, community activity, and yard work  PERSONAL FACTORS Age, Sex, Time since onset of injury/illness/exacerbation, and 3+ comorbidities: Other PMH is significant for HTN, HLD, macular degeneration, DM, obesity, sleep apnea, arthritis, chronic R hip pain, vitamin D deficiency, diastasis recti, abdominal wall hernia, hx of abdominal hysterectomy, appendectomy, spine surgery (years ago).   are also affecting patient's functional outcome.   REHAB POTENTIAL: Good  CLINICAL DECISION MAKING: Evolving/moderate complexity  EVALUATION COMPLEXITY: Moderate  PLAN: PT FREQUENCY: 2x/week  PT DURATION: 12 weeks  PLANNED INTERVENTIONS: Therapeutic exercises, Therapeutic activity, Neuromuscular re-education, Balance training, Gait training, Patient/Family education, Self Care, Joint mobilization, Joint manipulation, Stair training, Vestibular training, Canalith repositioning, Orthotic/Fit training, DME instructions, Dry Needling, Electrical stimulation, Wheelchair mobility training, Spinal mobilization, Cryotherapy, Moist heat, Splintting, Taping, Traction, Ultrasound, Biofeedback, Manual therapy, and Re-evaluation  PLAN FOR NEXT SESSION: strength, balance, gait   Temitope Flammer C, PT 04/21/2022,  9:25 AM  9:26 AM, 04/21/22 Etta Grandchild, PT, DPT Physical Therapist - [place] [phone]

## 2022-04-21 NOTE — Therapy (Signed)
OUTPATIENT SPEECH LANGUAGE PATHOLOGY TREATMENT NOTE   Patient Name: Alyssa Mayo MRN: [id number] DOB:1946/01/14, 76 y.o., female Today's Date: 04/21/2022  PCP: Maryland Pink, MD REFERRING PROVIDER: Frann Rider, NP  END OF SESSION:   End of Session - 04/21/22 1101     Visit Number 5    Number of Visits 25    Date for SLP Re-Evaluation 05/27/22    Authorization Type [employer] Medicare HMO/PPO    Progress Note Due on Visit 10    SLP Start Time 1100    SLP Stop Time  1200    SLP Time Calculation (min) 60 min    Activity Tolerance Patient tolerated treatment well             Past Medical History:  Diagnosis Date   Abnormal levels of other serum enzymes 07/17/2015   Arthritis    Constipation 07/11/2015   COVID-19 03/06/2021   Diabetes mellitus without complication ([place])    Elevated liver enzymes    Fatty liver    Generalized abdominal pain 07/11/2015   Hyperlipidemia    Hypertension    Lifelong obesity    Macular degeneration    Morbid obesity due to excess calories ([place]) 07/11/2015   Other fatigue 07/11/2015   Sleep apnea    Spinal headache    Past Surgical History:  Procedure Laterality Date   ABDOMINAL HYSTERECTOMY     APPENDECTOMY     CATARACT EXTRACTION     CERVICAL LAMINECTOMY  07/21/1985   CESAREAN SECTION     COLONOSCOPY  07/21/2012   diverticulosis, [facility] Dr. Donnella Sham    COLONOSCOPY WITH PROPOFOL N/A 02/04/2019   Procedure: COLONOSCOPY WITH PROPOFOL;  Surgeon: Lollie Sails, MD;  Location: [facility] ENDOSCOPY;  Service: Endoscopy;  Laterality: N/A;   COLONOSCOPY WITH PROPOFOL N/A 03/18/2021   Procedure: COLONOSCOPY WITH PROPOFOL;  Surgeon: Lesly Rubenstein, MD;  Location: [facility] ENDOSCOPY;  Service: Endoscopy;  Laterality: N/A;  DM   DIAGNOSTIC LAPAROSCOPY     ESOPHAGOGASTRODUODENOSCOPY (EGD) WITH PROPOFOL N/A 02/04/2019   Procedure: ESOPHAGOGASTRODUODENOSCOPY (EGD) WITH PROPOFOL;  Surgeon: Lollie Sails, MD;  Location: [facility] ENDOSCOPY;  Service:  Endoscopy;  Laterality: N/A;   ESOPHAGOGASTRODUODENOSCOPY (EGD) WITH PROPOFOL N/A 03/18/2021   Procedure: ESOPHAGOGASTRODUODENOSCOPY (EGD) WITH PROPOFOL;  Surgeon: Lesly Rubenstein, MD;  Location: [facility] ENDOSCOPY;  Service: Endoscopy;  Laterality: N/A;   LOOP RECORDER INSERTION N/A 08/05/2021   Procedure: LOOP RECORDER INSERTION;  Surgeon: Evans Lance, MD;  Location: [place] CV LAB;  Service: Cardiovascular;  Laterality: N/A;   SPINE SURGERY     TUBAL LIGATION     Patient Active Problem List   Diagnosis Date Noted   Cerebral edema ([place]) 08/07/2021   OSA (obstructive sleep apnea) 08/07/2021   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   CVA (cerebral vascular accident) ([place]) 08/01/2021   GERD (gastroesophageal reflux disease) 07/20/2019   Advanced care planning/counseling discussion 12/17/2016   Skin lesions, generalized 11/13/2016   Chronic right hip pain 06/17/2016   Vitamin D deficiency 09/26/2015   Diastasis recti 08/08/2015   Elevated serum GGT level 07/24/2015   Essential hypertension 07/17/2015   Fatty liver 07/17/2015   Elevated alkaline phosphatase level 07/17/2015   Elevated serum glutamic pyruvic transaminase (SGPT) level 07/17/2015   Abnormal finding on EKG 07/11/2015   Morbid obesity ([place]) 07/11/2015   Colon, diverticulosis 07/11/2015   Abdominal wall hernia 07/11/2015   DM (diabetes mellitus), type 2 with neurological complications ([place])    Hyperlipidemia  ONSET DATE: 08/01/2021 CVA; 02/26/2022 date of referral   REFERRING DIAG: I69.319 (ICD-10-CM) - Cognitive deficit, post-stroke I63.511 (ICD-10-CM) - Right middle cerebral artery stroke [facility])     PERTINENT HISTORY: Ms. Eliska Hamil is a 76 y.o. female with history of diabetes, hypertension, hyperlipidemia, obesity, and sleep apnea who presented to [facility] ED on 08/01/2021 after being found down by her son. Her last know well was approximately 07/29/2021. Pt transferred to [facility] for further CVA  management. In addition, pt received ST services in CIR from 08/08/2021 thru 08/21/2021.      DIAGNOSTIC FINDINGS: MRI showed extensive restricted diffusion throughout much of the right MCA territory with associated changes of hemorrhagic transformation and localized cerebral edema and mass effect. Per Dr. Pearlean Brownie, felt embolic pattern secondary to unclear source although concerning for occult A fib. Repeat [facility] 1/14 showed no interval progression of right MCA hemorrhagic infarct with petechial hemorrhage.  CTA head/neck showed right M2 thrombus.  EF 60 to 65%.   THERAPY DIAG:  Cognitive communication deficit  Right middle cerebral artery stroke [facility])  Rationale for Evaluation and Treatment Rehabilitation  SUBJECTIVE: I had a terrible day on Monday  Pt accompanied by: family member  PAIN:  Are you having pain? No  PATIENT GOALS to be able to drive again and live independently (she currently lives alone but her son plans on moving in with her)  OBJECTIVE:   TODAY'S TREATMENT: Skilled treatment session focused on pt's cognitive communication goals. SLP facilitated the session by providing the following interventions:  Pt with good recall of current medicines. Each medicine with their directions were written down and a different colored bead was assigned to each. Pt instructed to organize a BID bill organizer according to directions.   intermittent reminders to organize for 7 days Moderate redirection to tasks d/t mildly related conversation which increased time for task completion Total multimodal assistance to problem solve mistake made with application of medicine With the above level of assistance, pt achieve 63% accuracy in medication management    PATIENT EDUCATION: Education details: cognitive organization Person educated: Patient and Child(ren) Education method: Explanation, Demonstration, Verbal cues, and Handouts Education comprehension: needs further  education  GOALS: Goals reviewed with patient? Yes   SHORT TERM GOALS: Target date: 10 sessions   With moderate assistance, pt will recall her medicines with 80% accuracy.  Baseline: currently unable Goal status: INITIAL   2.  With minimal assistance, pt will complete basic medication management task with > 90% accuracy.  Baseline: total Goal status: INITIAL   3.  Pt will demonstrate selective attention to basic task for 15 minutes with < 2 redirections.  Baseline: moderate assistance Goal status: INITIAL   4.  Pt will complete basic bill paying task with 75% accuracy.  Baseline: total Goal status: INITIAL     LONG TERM GOALS: Target date: 05/27/2022 With rare min A, pt will organize her medicines accurately in a pill organizer.  Baseline: Total Goal status: INITIAL   2.  With rare min A, pt will demonstrate selective attention to task for 30 minutes.  Baseline: moderate Goal status: INITIAL   3.  With rare min A, pt will sequence basic meal prep activity.  Baseline: Moderate Goal status: INITIAL    ASSESSMENT:  CLINICAL IMPRESSION: There has been an extended period of time since pt has been to ST d/t scheduling conflicts with her son's schedule. She reports that her son and moved in with her. She states that she would like  to be independent with her medication management. In conversation when pt was recalling her current list of medicines, she states that she takes the correct number and the correct times as prescribed but she divides that into different time slots. SLP unable to understand description of when pt takes medications. At this time, recommend pt's son continues to do medication management for pt.   OBJECTIVE IMPAIRMENTS include attention and executive functioning. These impairments are limiting patient from managing medications, managing appointments, managing finances, household responsibilities, and ADLs/IADLs. Factors affecting potential to achieve goals  and functional outcome are ability to learn/carryover information. Patient will benefit from skilled SLP services to address above impairments and improve overall function.  REHAB POTENTIAL: Good  PLAN: SLP FREQUENCY: 1-2x/week  SLP DURATION: 12 weeks  PLANNED INTERVENTIONS: Functional tasks, SLP instruction and feedback, and Patient/family education   Courtenay Hirth B. Dreama Saa, M.S., CCC-SLP, Tree surgeon Certified Brain Injury Specialist [facility]  [facility] Rehabilitation Services Office [phone] Ascom [phone] Fax [phone]

## 2022-04-23 ENCOUNTER — Encounter: Payer: Medicare HMO | Admitting: Occupational Therapy

## 2022-04-23 ENCOUNTER — Ambulatory Visit: Payer: Medicare HMO | Admitting: Physical Therapy

## 2022-04-24 ENCOUNTER — Ambulatory Visit: Payer: Medicare HMO | Admitting: Occupational Therapy

## 2022-04-24 ENCOUNTER — Ambulatory Visit: Payer: Medicare HMO

## 2022-04-28 ENCOUNTER — Ambulatory Visit: Payer: Medicare HMO

## 2022-04-28 ENCOUNTER — Encounter: Payer: Self-pay | Admitting: Physical Therapy

## 2022-04-28 ENCOUNTER — Encounter: Payer: Medicare HMO | Admitting: Occupational Therapy

## 2022-04-28 ENCOUNTER — Ambulatory Visit: Payer: Medicare HMO | Admitting: Speech Pathology

## 2022-04-28 ENCOUNTER — Encounter: Payer: Medicare HMO | Admitting: Speech Pathology

## 2022-04-28 DIAGNOSIS — R41841 Cognitive communication deficit: Secondary | ICD-10-CM | POA: Diagnosis not present

## 2022-04-28 DIAGNOSIS — M6281 Muscle weakness (generalized): Secondary | ICD-10-CM

## 2022-04-28 DIAGNOSIS — R278 Other lack of coordination: Secondary | ICD-10-CM

## 2022-04-28 DIAGNOSIS — I63511 Cerebral infarction due to unspecified occlusion or stenosis of right middle cerebral artery: Secondary | ICD-10-CM

## 2022-04-28 DIAGNOSIS — R2681 Unsteadiness on feet: Secondary | ICD-10-CM

## 2022-04-28 NOTE — Therapy (Signed)
OUTPATIENT SPEECH LANGUAGE PATHOLOGY TREATMENT NOTE   Patient Name: Alyssa Mayo MRN: [id number] DOB:1945/10/01, 76 y.o., female Today's Date: 04/28/2022  PCP: Maryland Pink, MD REFERRING PROVIDER: Frann Rider, NP  END OF SESSION:   End of Session - 04/28/22 1210     Visit Number 6    Number of Visits 25    Date for SLP Re-Evaluation 05/27/22    Authorization Type [employer] Medicare HMO/PPO    SLP Start Time 1100    SLP Stop Time  1200    SLP Time Calculation (min) 60 min    Activity Tolerance Patient tolerated treatment well;Patient limited by pain             Past Medical History:  Diagnosis Date   Abnormal levels of other serum enzymes 07/17/2015   Arthritis    Constipation 07/11/2015   COVID-19 03/06/2021   Diabetes mellitus without complication ([place])    Elevated liver enzymes    Fatty liver    Generalized abdominal pain 07/11/2015   Hyperlipidemia    Hypertension    Lifelong obesity    Macular degeneration    Morbid obesity due to excess calories ([place]) 07/11/2015   Other fatigue 07/11/2015   Sleep apnea    Spinal headache    Past Surgical History:  Procedure Laterality Date   ABDOMINAL HYSTERECTOMY     APPENDECTOMY     CATARACT EXTRACTION     CERVICAL LAMINECTOMY  07/21/1985   CESAREAN SECTION     COLONOSCOPY  07/21/2012   diverticulosis, [facility] Dr. Donnella Sham    COLONOSCOPY WITH PROPOFOL N/A 02/04/2019   Procedure: COLONOSCOPY WITH PROPOFOL;  Surgeon: Lollie Sails, MD;  [facility] ENDOSCOPY;  Service: Endoscopy;  Laterality: N/A;   COLONOSCOPY WITH PROPOFOL N/A 03/18/2021   Procedure: COLONOSCOPY WITH PROPOFOL;  Surgeon: Lesly Rubenstein, MD;  Location: [facility] ENDOSCOPY;  Service: Endoscopy;  Laterality: N/A;  DM   DIAGNOSTIC LAPAROSCOPY     ESOPHAGOGASTRODUODENOSCOPY (EGD) WITH PROPOFOL N/A 02/04/2019   Procedure: ESOPHAGOGASTRODUODENOSCOPY (EGD) WITH PROPOFOL;  Surgeon: Lollie Sails, MD;  Location: [facility] ENDOSCOPY;  Service:  Endoscopy;  Laterality: N/A;   ESOPHAGOGASTRODUODENOSCOPY (EGD) WITH PROPOFOL N/A 03/18/2021   Procedure: ESOPHAGOGASTRODUODENOSCOPY (EGD) WITH PROPOFOL;  Surgeon: Lesly Rubenstein, MD;  Location: [facility] ENDOSCOPY;  Service: Endoscopy;  Laterality: N/A;   LOOP RECORDER INSERTION N/A 08/05/2021   Procedure: LOOP RECORDER INSERTION;  Surgeon: Evans Lance, MD;  Location: [place] CV LAB;  Service: Cardiovascular;  Laterality: N/A;   SPINE SURGERY     TUBAL LIGATION     Patient Active Problem List   Diagnosis Date Noted   Cerebral edema ([place]) 08/07/2021   OSA (obstructive sleep apnea) 08/07/2021   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   CVA (cerebral vascular accident) ([place]) 08/01/2021   GERD (gastroesophageal reflux disease) 07/20/2019   Advanced care planning/counseling discussion 12/17/2016   Skin lesions, generalized 11/13/2016   Chronic right hip pain 06/17/2016   Vitamin D deficiency 09/26/2015   Diastasis recti 08/08/2015   Elevated serum GGT level 07/24/2015   Essential hypertension 07/17/2015   Fatty liver 07/17/2015   Elevated alkaline phosphatase level 07/17/2015   Elevated serum glutamic pyruvic transaminase (SGPT) level 07/17/2015   Abnormal finding on EKG 07/11/2015   Morbid obesity ([place]) 07/11/2015   Colon, diverticulosis 07/11/2015   Abdominal wall hernia 07/11/2015   DM (diabetes mellitus), type 2 with neurological complications ([place])    Hyperlipidemia       ONSET DATE: 08/01/2021  CVA; 02/26/2022 date of referral   REFERRING DIAG: I69.319 (ICD-10-CM) - Cognitive deficit, post-stroke I63.511 (ICD-10-CM) - Right middle cerebral artery stroke [facility])     PERTINENT HISTORY: [name] is a 76 y.o. female with history of diabetes, hypertension, hyperlipidemia, obesity, and sleep apnea who presented to [facility] ED on 08/01/2021 after being found down by her son. Her last know well was approximately 07/29/2021. Pt transferred to [facility] for further  CVA management. In addition, pt received ST services in CIR from 08/08/2021 thru 08/21/2021.      DIAGNOSTIC FINDINGS: MRI showed extensive restricted diffusion throughout much of the right MCA territory with associated changes of hemorrhagic transformation and localized cerebral edema and mass effect. Per Dr. Leonie Man, felt embolic pattern secondary to unclear source although concerning for occult A fib. Repeat [facility] 1/14 showed no interval progression of right MCA hemorrhagic infarct with petechial hemorrhage.  CTA head/neck showed right M2 thrombus.  EF 60 to 65%.    THERAPY DIAG:  Cognitive communication deficit   Right middle cerebral artery stroke [facility])   Rationale for Evaluation and Treatment Rehabilitation   SUBJECTIVE: Patient arrived pleasantly motivated for tx   Pt accompanied by: self   PAIN:  Are you having pain? No   PATIENT GOALS to be able to drive again and live independently (she currently lives with her son)   OBJECTIVE:    TODAY'S TREATMENT: Skilled treatment session focused on pt's cognitive communication goals. Intervention targeted training recall/memory strategies (repetition, visualization) and carry over to structured task. Patient demonstrated good carryover of recall strategies with immediate recall tasks:  -Recalling a picture in a F3 with 95% acc x20,  -Recalling 3 pictures ina F5 with 90% acc x10--improved to 100% with narrowed selection field.  -Recalling 2 words in a F4 with 100% acc x20 -Recalling 3 words in a F6 with 100% acc x20    Patient demonstrated increased challenge in recall with increased complexity and dual domain tasks requiring attention and memory as with sequencing 3-5 picture tasks. Set 1 patient demonstrated 50% accuracy x10, improved to 100% acc with verbal cues to identify key features and utilizing auditory cues to further recall items for organization. Improved carryover of strategies and cues with second set as patient demonstrated 70%  acc x10. Improved to 100% acc with auditory cues detailing the pictures.                 PATIENT EDUCATION: Education details: Animator Person educated: Patient  Education method: Consulting civil engineer, Media planner, Verbal cues, and Handouts Education comprehension: needs further education   GOALS: Goals reviewed with patient? Yes   SHORT TERM GOALS: Target date: 10 sessions   With moderate assistance, pt will recall her medicines with 80% accuracy.  Baseline: currently unable Goal status: INITIAL   2.  With minimal assistance, pt will complete basic medication management task with > 90% accuracy.  Baseline: total Goal status: INITIAL   3.  Pt will demonstrate selective attention to basic task for 15 minutes with < 2 redirections.  Baseline: moderate assistance Goal status: INITIAL   4.  Pt will complete basic bill paying task with 75% accuracy.  Baseline: total Goal status: INITIAL     LONG TERM GOALS: Target date: 05/27/2022 With rare min A, pt will organize her medicines accurately in a pill organizer.  Baseline: Total Goal status: INITIAL   2.  With rare min A, pt will demonstrate selective attention to task for 30 minutes.  Baseline: moderate  Goal status: INITIAL   3.  With rare min A, pt will sequence basic meal prep activity.  Baseline: Moderate Goal status: INITIAL       ASSESSMENT:   CLINICAL IMPRESSION: Patient presents with moderate cognitive linguistic impairment. Session targeted recall/memory and attention as per data above. Patient benefits from strategy use and structured task training though remains challenged by unstructured tasks (ex: when leaving the session patient unable to direct to Rehab waiting room and required SLP verbal directions despite having come from the waiting room earlier this date).     OBJECTIVE IMPAIRMENTS include attention and executive functioning. These impairments are limiting patient from managing medications,  managing appointments, managing finances, household responsibilities, and ADLs/IADLs. Factors affecting potential to achieve goals and functional outcome are ability to learn/carryover information. Patient will benefit from continued skilled SLP services to address above impairments and improve overall function.   REHAB POTENTIAL: Good   PLAN: SLP FREQUENCY: 1-2x/week   SLP DURATION: 12 weeks   PLANNED INTERVENTIONS: Functional tasks, SLP instruction and feedback, and Patient/family education    [place] L. [place] Gopal, M.A. [place] [phone]  [place], [place] 04/28/2022, 12:11 PM   [place] MAIN [facility] SERVICES [address] [place], [place], [address] Phone: [phone]   Fax:  [phone]

## 2022-04-28 NOTE — Therapy (Signed)
OUTPATIENT PHYSICAL THERAPY NEURO TREATMENT   Patient Name: Alyssa Mayo MRN: [id number] DOB:24-Jul-1945, 76 y.o., female Today's Date: 04/28/2022   PCP: [place] Pink, MD REFERRING PROVIDER: Frann Rider, NP   PT End of Session - 04/28/22 1021     Visit Number 4    Number of Visits 25    Date for PT Re-Evaluation 06/25/22    Authorization Type [employer]    Authorization Time Period 04/02/2022-06/25/2022    Progress Note Due on Visit 10    PT Start Time 1018    PT Stop Time 1100    PT Time Calculation (min) 42 min    Equipment Utilized During Treatment Gait belt    Activity Tolerance Patient tolerated treatment well;Patient limited by pain    Behavior During Therapy [facility] for tasks assessed/performed             Past Medical History:  Diagnosis Date   Abnormal levels of other serum enzymes 07/17/2015   Arthritis    Constipation 07/11/2015   COVID-19 03/06/2021   Diabetes mellitus without complication ([place])    Elevated liver enzymes    Fatty liver    Generalized abdominal pain 07/11/2015   Hyperlipidemia    Hypertension    Lifelong obesity    Macular degeneration    Morbid obesity due to excess calories ([place]) 07/11/2015   Other fatigue 07/11/2015   Sleep apnea    Spinal headache    Past Surgical History:  Procedure Laterality Date   ABDOMINAL HYSTERECTOMY     APPENDECTOMY     CATARACT EXTRACTION     CERVICAL LAMINECTOMY  07/21/1985   CESAREAN SECTION     COLONOSCOPY  07/21/2012   diverticulosis, [facility] Dr. Donnella Sham    COLONOSCOPY WITH PROPOFOL N/A 02/04/2019   Procedure: COLONOSCOPY WITH PROPOFOL;  Surgeon: Lollie Sails, MD;  Location: [facility] ENDOSCOPY;  Service: Endoscopy;  Laterality: N/A;   COLONOSCOPY WITH PROPOFOL N/A 03/18/2021   Procedure: COLONOSCOPY WITH PROPOFOL;  Surgeon: Lesly Rubenstein, MD;  Location: [facility] ENDOSCOPY;  Service: Endoscopy;  Laterality: N/A;  DM   DIAGNOSTIC LAPAROSCOPY     ESOPHAGOGASTRODUODENOSCOPY (EGD) WITH  PROPOFOL N/A 02/04/2019   Procedure: ESOPHAGOGASTRODUODENOSCOPY (EGD) WITH PROPOFOL;  Surgeon: Lollie Sails, MD;  Location: [facility] ENDOSCOPY;  Service: Endoscopy;  Laterality: N/A;   ESOPHAGOGASTRODUODENOSCOPY (EGD) WITH PROPOFOL N/A 03/18/2021   Procedure: ESOPHAGOGASTRODUODENOSCOPY (EGD) WITH PROPOFOL;  Surgeon: Lesly Rubenstein, MD;  Location: [facility] ENDOSCOPY;  Service: Endoscopy;  Laterality: N/A;   LOOP RECORDER INSERTION N/A 08/05/2021   Procedure: LOOP RECORDER INSERTION;  Surgeon: Evans Lance, MD;  Location: [place] CV LAB;  Service: Cardiovascular;  Laterality: N/A;   [place]     TUBAL LIGATION     Patient Active Problem List   Diagnosis Date Noted   Cerebral edema ([place]) 08/07/2021   OSA (obstructive sleep apnea) 08/07/2021   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   CVA (cerebral vascular accident) ([place]) 08/01/2021   GERD (gastroesophageal reflux disease) 07/20/2019   Advanced care planning/counseling discussion 12/17/2016   Skin lesions, generalized 11/13/2016   Chronic right hip pain 06/17/2016   Vitamin D deficiency 09/26/2015   Diastasis recti 08/08/2015   Elevated serum GGT level 07/24/2015   Essential hypertension 07/17/2015   Fatty liver 07/17/2015   Elevated alkaline phosphatase level 07/17/2015   Elevated serum glutamic pyruvic transaminase (SGPT) level 07/17/2015   Abnormal finding on EKG 07/11/2015   Morbid obesity ([place]) 07/11/2015   Colon, diverticulosis 07/11/2015  Abdominal wall hernia 07/11/2015   DM (diabetes mellitus), type 2 with neurological complications [facility])    Hyperlipidemia     ONSET DATE: January 11th 2023  REFERRING DIAG:  T26.712,W58.0 (ICD-10-CM) - Gait disturbance, post-stroke  I63.511 (ICD-10-CM) - Right middle cerebral artery stroke (HCC)    THERAPY DIAG:  Other lack of coordination  Muscle weakness (generalized)  Right middle cerebral artery stroke (HCC)  Unsteadiness on feet  Rationale for  Evaluation and Treatment Rehabilitation  SUBJECTIVE:                                                                                                                                                                                              SUBJECTIVE STATEMENT: Pt reports no knee pain this date. Denies falls. Has been able to find relief with sleeping which has been difficult for her.   Pt accompanied by: self  PERTINENT HISTORY: Per chart and confirmed by pt: Pt is a 76 yo female with RMCA CVA infarction with hemorrhagic transformation 08/01/2021, with inpatient rehab 08/07/2021-08/22/2021 followed by [facility] therapy until June. Pt now currently being seen for outpatient OT and ST services. Pt reports limitations in stamina persist. Other PMH is significant for HTN, HLD, macular degeneration, DM, obesity, sleep apnea, arthritis, chronic R hip pain, vitamin D deficiency, diastasis recti, abdominal wall hernia, hx of abdominal hysterectomy, appendectomy, spine surgery (years ago).   PAIN:  Are you having pain? Yes; 5/10 Left knee     TODAY'S TREATMENT:  Neuro Re-Ed:   Alternating step taps onto 6" step: 2x12/LE. CGA   Side ways airex pad step ups: 2x12/LE. CGA     3 cone of varying colors adding cognitive task of placing called out LE to cone color. 2 minutes total. CGA. Most difficulty with across body cone taps.    STS with airex pad under feet: 3x8. Consistently lowering mat table each rep. Min VC's for anterior trunk lean over knees to assist in standing. Mod challenge to ankle strategy but no LOB.    Alternating mini lunges at // bars for LE in prep to assess LE strength for floor transfer in case pt were to fall.    Obstacle course: hurdle step over --> 1/2 bolster step over --> hurdle step over: x4 laps. Min to mod VC's for LLE first to improve hip flexion weakness. CGA.  PATIENT EDUCATION: Education details: exam findings, indications, plan, HEP Person educated: Patient Education  method: Explanation, Demonstration, Verbal cues, and Handouts Education comprehension: verbalized understanding, returned demonstration, verbal cues required, and needs further education   HOME EXERCISE PROGRAM: Access Code: [email or web address] URL: [email or web address] Date:  04/02/2022 Prepared by: Ricard Dillon  Exercises - Seated March  - 1 x daily - 5 x weekly - 3 sets - 15 reps - Standing Single Leg Stance with Counter Support  - 1 x daily - 7 x weekly - 2 sets - 2 reps - 30 seconds hold    GOALS: Goals reviewed with patient? Yes  SHORT TERM GOALS: Target date: 06/09/2022  Patient will be independent in home exercise program to improve strength/mobility for better functional independence with ADLs. Baseline: initiated Goal status: INITIAL   LONG TERM GOALS: Target date: 07/21/2022  Patient will increase FOTO score to equal to or greater than 65  to demonstrate improvement in mobility and quality of life.  Baseline: 57 Goal status: INITIAL  2.  Patient (> 30 years old) will complete five times sit to stand test in < 15 seconds indicating an increased LE strength and improved balance. Baseline: 17 sec hands-free Goal status: INITIAL  3.   Patient will increase 10 meter walk test to >1.43m/s as to improve gait speed for better community ambulation and to reduce fall risk. Baseline: 0.66 m/s Goal status: INITIAL  4.  Patient will increase Berg Balance score by > 3 points to demonstrate decreased fall risk during functional activities. Baseline: 50/56 Goal status: INITIAL  5.  Patient will increase six minute walk test distance to >1000 for progression to community ambulator and improve gait ability Baseline: 04/10/22: 673ft (pain onset at 5 minutes)  Goal status: INITIAL    ASSESSMENT:  CLINICAL IMPRESSION: Continuing PT POC with focus on functional strengthening and dynamic balance. Pt requiring to min mod VC's throughout session to reduce SUE to BUE use with  stepping and SLS activities. Pt has most difficulty with balance with date with LLE hip flexion for foot clearance and SLS time requiring intermittent stepping strategy or UE use to correct LOB. Pt will continue to benefit from skilled PT services to reduce risk of falls and address remaining deficits.     OBJECTIVE IMPAIRMENTS Abnormal gait, decreased activity tolerance, decreased balance, decreased coordination, decreased endurance, decreased mobility, difficulty walking, decreased strength, impaired sensation, impaired UE functional use, improper body mechanics, postural dysfunction, and pain.   ACTIVITY LIMITATIONS carrying, lifting, bending, standing, squatting, stairs, bed mobility, bathing, reach over head, hygiene/grooming, and locomotion level  PARTICIPATION LIMITATIONS: meal prep, cleaning, laundry, medication management, personal finances, driving, shopping, community activity, and yard work  PERSONAL FACTORS Age, Sex, Time since onset of injury/illness/exacerbation, and 3+ comorbidities: Other PMH is significant for HTN, HLD, macular degeneration, DM, obesity, sleep apnea, arthritis, chronic R hip pain, vitamin D deficiency, diastasis recti, abdominal wall hernia, hx of abdominal hysterectomy, appendectomy, spine surgery (years ago).   are also affecting patient's functional outcome.   REHAB POTENTIAL: Good  CLINICAL DECISION MAKING: Evolving/moderate complexity  EVALUATION COMPLEXITY: Moderate  PLAN: PT FREQUENCY: 2x/week  PT DURATION: 12 weeks  PLANNED INTERVENTIONS: Therapeutic exercises, Therapeutic activity, Neuromuscular re-education, Balance training, Gait training, Patient/Family education, Self Care, Joint mobilization, Joint manipulation, Stair training, Vestibular training, Canalith repositioning, Orthotic/Fit training, DME instructions, Dry Needling, Electrical stimulation, Wheelchair mobility training, Spinal mobilization, Cryotherapy, Moist heat, Splintting, Taping,  Traction, Ultrasound, Biofeedback, Manual therapy, and Re-evaluation  PLAN FOR NEXT SESSION: strength, balance, gait   Salem Caster. Fairly IV, PT, DPT Physical Therapist- [facility]  04/28/2022, 11:14 AM

## 2022-04-29 ENCOUNTER — Ambulatory Visit: Payer: Medicare HMO

## 2022-04-29 ENCOUNTER — Ambulatory Visit: Payer: Medicare HMO | Admitting: Occupational Therapy

## 2022-04-29 ENCOUNTER — Encounter: Payer: Self-pay | Admitting: Occupational Therapy

## 2022-04-29 DIAGNOSIS — I63511 Cerebral infarction due to unspecified occlusion or stenosis of right middle cerebral artery: Secondary | ICD-10-CM

## 2022-04-29 DIAGNOSIS — R2681 Unsteadiness on feet: Secondary | ICD-10-CM

## 2022-04-29 DIAGNOSIS — R278 Other lack of coordination: Secondary | ICD-10-CM

## 2022-04-29 DIAGNOSIS — R262 Difficulty in walking, not elsewhere classified: Secondary | ICD-10-CM

## 2022-04-29 DIAGNOSIS — M6281 Muscle weakness (generalized): Secondary | ICD-10-CM

## 2022-04-29 DIAGNOSIS — R41841 Cognitive communication deficit: Secondary | ICD-10-CM | POA: Diagnosis not present

## 2022-04-29 DIAGNOSIS — R482 Apraxia: Secondary | ICD-10-CM

## 2022-04-29 NOTE — Therapy (Signed)
OUTPATIENT PHYSICAL THERAPY NEURO TREATMENT   Patient Name: Alyssa Mayo MRN: [id number] DOB:03-Dec-1945, 76 y.o., female Today's Date: 04/29/2022   PCP: [name], MD REFERRING PROVIDER: [name], NP   PT End of Session - 04/29/22 1101     Visit Number 5    Number of Visits 25    Date for PT Re-Evaluation 06/25/22    Authorization Type [employer] Medicare    Authorization Time Period 04/02/2022-06/25/2022    Progress Note Due on Visit 10    PT Start Time 1102    PT Stop Time 1145    PT Time Calculation (min) 43 min    Equipment Utilized During Treatment Gait belt    Activity Tolerance Patient tolerated treatment well;Patient limited by pain    Behavior During Therapy [facility] for tasks assessed/performed              Past Medical History:  Diagnosis Date   Abnormal levels of other serum enzymes 07/17/2015   Arthritis    Constipation 07/11/2015   COVID-19 03/06/2021   Diabetes mellitus without complication (HCC)    Elevated liver enzymes    Fatty liver    Generalized abdominal pain 07/11/2015   Hyperlipidemia    Hypertension    Lifelong obesity    Macular degeneration    Morbid obesity due to excess calories (HCC) 07/11/2015   Other fatigue 07/11/2015   Sleep apnea    Spinal headache    Past Surgical History:  Procedure Laterality Date   ABDOMINAL HYSTERECTOMY     APPENDECTOMY     CATARACT EXTRACTION     CERVICAL LAMINECTOMY  07/21/1985   CESAREAN SECTION     COLONOSCOPY  07/21/2012   diverticulosis, [facility] Dr. [name]    COLONOSCOPY WITH PROPOFOL N/A 02/04/2019   Procedure: COLONOSCOPY WITH PROPOFOL;  Surgeon: [name], MD;  Location: [facility] ENDOSCOPY;  Service: Endoscopy;  Laterality: N/A;   COLONOSCOPY WITH PROPOFOL N/A 03/18/2021   Procedure: COLONOSCOPY WITH PROPOFOL;  Surgeon: [name], MD;  Location: [facility] ENDOSCOPY;  Service: Endoscopy;  Laterality: N/A;  DM   DIAGNOSTIC LAPAROSCOPY     ESOPHAGOGASTRODUODENOSCOPY (EGD) WITH  PROPOFOL N/A 02/04/2019   Procedure: ESOPHAGOGASTRODUODENOSCOPY (EGD) WITH PROPOFOL;  Surgeon: [name], MD;  Location: [facility] ENDOSCOPY;  Service: Endoscopy;  Laterality: N/A;   ESOPHAGOGASTRODUODENOSCOPY (EGD) WITH PROPOFOL N/A 03/18/2021   Procedure: ESOPHAGOGASTRODUODENOSCOPY (EGD) WITH PROPOFOL;  Surgeon: [name], MD;  Location: [facility] ENDOSCOPY;  Service: Endoscopy;  Laterality: N/A;   LOOP RECORDER INSERTION N/A 08/05/2021   Procedure: LOOP RECORDER INSERTION;  Surgeon: [name], MD;  Location: [facility] INVASIVE CV LAB;  Service: Cardiovascular;  Laterality: N/A;   SPINE SURGERY     TUBAL LIGATION     Patient Active Problem List   Diagnosis Date Noted   Cerebral edema (HCC) 08/07/2021   OSA (obstructive sleep apnea) 08/07/2021   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   CVA (cerebral vascular accident) (HCC) 08/01/2021   GERD (gastroesophageal reflux disease) 07/20/2019   Advanced care planning/counseling discussion 12/17/2016   Skin lesions, generalized 11/13/2016   Chronic right hip pain 06/17/2016   Vitamin D deficiency 09/26/2015   Diastasis recti 08/08/2015   Elevated serum GGT level 07/24/2015   Essential hypertension 07/17/2015   Fatty liver 07/17/2015   Elevated alkaline phosphatase level 07/17/2015   Elevated serum glutamic pyruvic transaminase (SGPT) level 07/17/2015   Abnormal finding on EKG 07/11/2015   Morbid obesity (HCC) 07/11/2015   Colon, diverticulosis  07/11/2015   Abdominal wall hernia 07/11/2015   DM (diabetes mellitus), type 2 with neurological complications [facility])    Hyperlipidemia     ONSET DATE: January 11th 2023  REFERRING DIAG:  I69.398,R26.9 (ICD-10-CM) - Gait disturbance, post-stroke  I63.511 (ICD-10-CM) - Right middle cerebral artery stroke (HCC)    THERAPY DIAG:  Other lack of coordination  Muscle weakness (generalized)  Right middle cerebral artery stroke (HCC)  Unsteadiness on feet  Cognitive  communication deficit  Apraxia  Difficulty in walking, not elsewhere classified  Rationale for Evaluation and Treatment Rehabilitation  SUBJECTIVE:                                                                                                                                                                                              SUBJECTIVE STATEMENT: No knee pain today, no falls, no LOB/stumbles. Sleep continues to get better. Continues to have difficulty falling asleep and staying asleep.   Pt accompanied by: self  PERTINENT HISTORY: Per chart and confirmed by pt: Pt is a 76 yo female with RMCA CVA infarction with hemorrhagic transformation 08/01/2021, with inpatient rehab 08/07/2021-08/22/2021 followed by [facility] therapy until June. Pt now currently being seen for outpatient OT and ST services. Pt reports limitations in stamina persist. Other PMH is significant for HTN, HLD, macular degeneration, DM, obesity, sleep apnea, arthritis, chronic R hip pain, vitamin D deficiency, diastasis recti, abdominal wall hernia, hx of abdominal hysterectomy, appendectomy, spine surgery (years ago).   PAIN:  Are you having pain? No    TODAY'S TREATMENT:  Neuro Re-Ed:   Alternating step taps onto 6" step: 2x12/LE. Supervision with UUE support    Forward and Side ways airex pad step ups: 2x12/LE. SBA for safety; mirror for proprioceptive feedback, UUE support (L step up), no UE support (R step up)     3 cone of varying colors adding cognitive task of placing called out LE to cone color. 2 minutes total. CGA, UUE support. Most difficulty with across body cone taps. Mirror for biofeedback   STS with airex pad under feet: 2x8. Consistently lowering mat table each rep. Min VC's for anterior trunk lean over knees to assist in standing. Mod challenge to ankle strategy but no LOB.    Alternating mini lunges at // bars for with emphasis on power for LE to return to starting position; cues for shorter step  length; BUE support, supervision. Mirror for biofeedback   NBOS cross body reaching, no UE support x70min  Tandem stance on airex pad cross body reaching, no UE support x1 min  NBOS on airex pad, cross body reaching, no  BUE support x40min  Not performed today:  Obstacle course: hurdle step over --> 1/2 bolster step over --> hurdle step over: x4 laps. Min to mod VC's for LLE first to improve hip flexion weakness. CGA.  PATIENT EDUCATION: Education details: exam findings, indications, plan, HEP Person educated: Patient Education method: Explanation, Demonstration, Verbal cues, and Handouts Education comprehension: verbalized understanding, returned demonstration, verbal cues required, and needs further education   HOME EXERCISE PROGRAM: Access Code: [email or web address] URL: [email or web address] Date: 04/02/2022 Prepared by: Ricard Dillon  Exercises - Seated March  - 1 x daily - 5 x weekly - 3 sets - 15 reps - Standing Single Leg Stance with Counter Support  - 1 x daily - 7 x weekly - 2 sets - 2 reps - 30 seconds hold    GOALS: Goals reviewed with patient? Yes  SHORT TERM GOALS: Target date: 06/10/2022  Patient will be independent in home exercise program to improve strength/mobility for better functional independence with ADLs. Baseline: initiated Goal status: INITIAL   LONG TERM GOALS: Target date: 07/22/2022  Patient will increase FOTO score to equal to or greater than 65  to demonstrate improvement in mobility and quality of life.  Baseline: 57 Goal status: INITIAL  2.  Patient (> 18 years old) will complete five times sit to stand test in < 15 seconds indicating an increased LE strength and improved balance. Baseline: 17 sec hands-free Goal status: INITIAL  3.   Patient will increase 10 meter walk test to >1.40m/s as to improve gait speed for better community ambulation and to reduce fall risk. Baseline: 0.66 m/s Goal status: INITIAL  4.  Patient will increase Berg  Balance score by > 3 points to demonstrate decreased fall risk during functional activities. Baseline: 50/56 Goal status: INITIAL  5.  Patient will increase six minute walk test distance to >1000 for progression to community ambulator and improve gait ability Baseline: 04/10/22: 633ft (pain onset at 5 minutes)  Goal status: INITIAL    ASSESSMENT:  CLINICAL IMPRESSION: Pt tolerated treatment well today. Interventions progressed with use of mirror for proprioceptive feedback, demonstrating improved motor planning/grading with step up/cone tap interventions. Incorporated standing balance with cross-body reaching outside BOS to mimic cooking, as this is a goal the pt would like to get back to. Improved power to stand with STS on airex pad with repetition, relying less on momentum for facilitation. Pt will continue to benefit from skilled OPPT services to address deficits for return to baseline function.    OBJECTIVE IMPAIRMENTS Abnormal gait, decreased activity tolerance, decreased balance, decreased coordination, decreased endurance, decreased mobility, difficulty walking, decreased strength, impaired sensation, impaired UE functional use, improper body mechanics, postural dysfunction, and pain.   ACTIVITY LIMITATIONS carrying, lifting, bending, standing, squatting, stairs, bed mobility, bathing, reach over head, hygiene/grooming, and locomotion level  PARTICIPATION LIMITATIONS: meal prep, cleaning, laundry, medication management, personal finances, driving, shopping, community activity, and yard work  PERSONAL FACTORS Age, Sex, Time since onset of injury/illness/exacerbation, and 3+ comorbidities: Other PMH is significant for HTN, HLD, macular degeneration, DM, obesity, sleep apnea, arthritis, chronic R hip pain, vitamin D deficiency, diastasis recti, abdominal wall hernia, hx of abdominal hysterectomy, appendectomy, spine surgery (years ago).   are also affecting patient's functional outcome.    REHAB POTENTIAL: Good  CLINICAL DECISION MAKING: Evolving/moderate complexity  EVALUATION COMPLEXITY: Moderate  PLAN: PT FREQUENCY: 2x/week  PT DURATION: 12 weeks  PLANNED INTERVENTIONS: Therapeutic exercises, Therapeutic activity, Neuromuscular re-education, Balance training, Gait training, Patient/Family education, Self Care, Joint  mobilization, Joint manipulation, Stair training, Vestibular training, Canalith repositioning, Orthotic/Fit training, DME instructions, Dry Needling, Electrical stimulation, Wheelchair mobility training, Spinal mobilization, Cryotherapy, Moist heat, Splintting, Taping, Traction, Ultrasound, Biofeedback, Manual therapy, and Re-evaluation  PLAN FOR NEXT SESSION: multidirectional reaching coupled with cognitive/balance, strength, balance, gait  Vira Blanco, PT, DPT 12:58 PM,04/29/22 Physical Therapist - [facility]

## 2022-04-29 NOTE — Therapy (Addendum)
OUTPATIENT OCCUPATIONAL THERAPY TREATMENT NOTE  Patient Name: Alyssa Mayo MRN: [id number] DOB:1946/05/17, 76 y.o., female Today's Date: 03/14/2022  PCP: Alyssa Mina, MD REFERRING PROVIDER: [name], NP   OT End of Session - 04/29/22 1149     Visit Number 8    Number of Visits 24    Date for OT Re-Evaluation 05/27/22    Authorization Type Progress reporting period starting 03/04/2022    OT Start Time 1145    OT Stop Time 1230    OT Time Calculation (min) 45 min    Activity Tolerance Patient tolerated treatment well    Behavior During Therapy [facility] for tasks assessed/performed             Past Medical History:  Diagnosis Date   Abnormal levels of other serum enzymes 07/17/2015   Arthritis    Constipation 07/11/2015   COVID-19 03/06/2021   Diabetes mellitus without complication (HCC)    Elevated liver enzymes    Fatty liver    Generalized abdominal pain 07/11/2015   Hyperlipidemia    Hypertension    Lifelong obesity    Macular degeneration    Morbid obesity due to excess calories (HCC) 07/11/2015   Other fatigue 07/11/2015   Sleep apnea    Spinal headache    Past Surgical History:  Procedure Laterality Date   ABDOMINAL HYSTERECTOMY     APPENDECTOMY     CATARACT EXTRACTION     CERVICAL LAMINECTOMY  07/21/1985   CESAREAN SECTION     COLONOSCOPY  07/21/2012   diverticulosis, [facility] Dr. Ricki Mayo    COLONOSCOPY WITH PROPOFOL N/A 02/04/2019   Procedure: COLONOSCOPY WITH PROPOFOL;  Surgeon: Alyssa Deem, MD;  Location: [facility] ENDOSCOPY;  Service: Endoscopy;  Laterality: N/A;   COLONOSCOPY WITH PROPOFOL N/A 03/18/2021   Procedure: COLONOSCOPY WITH PROPOFOL;  Surgeon: Alyssa Bill, MD;  Location: [facility] ENDOSCOPY;  Service: Endoscopy;  Laterality: N/A;  DM   DIAGNOSTIC LAPAROSCOPY     ESOPHAGOGASTRODUODENOSCOPY (EGD) WITH PROPOFOL N/A 02/04/2019   Procedure: ESOPHAGOGASTRODUODENOSCOPY (EGD) WITH PROPOFOL;  Surgeon: Alyssa Deem, MD;  Location:  [facility] ENDOSCOPY;  Service: Endoscopy;  Laterality: N/A;   ESOPHAGOGASTRODUODENOSCOPY (EGD) WITH PROPOFOL N/A 03/18/2021   Procedure: ESOPHAGOGASTRODUODENOSCOPY (EGD) WITH PROPOFOL;  Surgeon: Alyssa Bill, MD;  Location: [facility] ENDOSCOPY;  Service: Endoscopy;  Laterality: N/A;   LOOP RECORDER INSERTION N/A 08/05/2021   Procedure: LOOP RECORDER INSERTION;  Surgeon: Alyssa Maw, MD;  Location: [facility] INVASIVE CV LAB;  Service: Cardiovascular;  Laterality: N/A;   SPINE SURGERY     TUBAL LIGATION     Patient Active Problem List   Diagnosis Date Noted   Cerebral edema (HCC) 08/07/2021   OSA (obstructive sleep apnea) 08/07/2021   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   CVA (cerebral vascular accident) (HCC) 08/01/2021   GERD (gastroesophageal reflux disease) 07/20/2019   Advanced care planning/counseling discussion 12/17/2016   Skin lesions, generalized 11/13/2016   Chronic right hip pain 06/17/2016   Vitamin D deficiency 09/26/2015   Diastasis recti 08/08/2015   Elevated serum GGT level 07/24/2015   Essential hypertension 07/17/2015   Fatty liver 07/17/2015   Elevated alkaline phosphatase level 07/17/2015   Elevated serum glutamic pyruvic transaminase (SGPT) level 07/17/2015   Abnormal finding on EKG 07/11/2015   Morbid obesity (HCC) 07/11/2015   Colon, diverticulosis 07/11/2015   Abdominal wall hernia 07/11/2015   DM (diabetes mellitus), type 2 with neurological complications (HCC)    Hyperlipidemia     ONSET DATE:  08/01/2021  REFERRING DIAG: CVA  THERAPY DIAG:  Muscle weakness (generalized)  Other lack of coordination  Rationale for Evaluation and Treatment Rehabilitation  SUBJECTIVE:   SUBJECTIVE STATEMENT:   Pt. reports that she had a nice weekend going out to eat with friends who were old coworkers.   Pt accompanied by: self  PERTINENT HISTORY:  Pt. Is a 76 y.o. female  presents with a diagnosis of RMCA CVA infarction with hemorrhagic transformation.  Pt. Attended inpatient rehab from 08/07/2021-08/22/2021. Pt. received Home health therapy services this past spring.  Pt. Has recently had an assessment through driver rehabilitation services at [facility] with recommendations for referrals for  outpatient OT/PT, and ST services. Pt. PMHx includes: HTN, Hyperlipidemia, Macular degeneration, DM, and Obesity.  PRECAUTIONS: None  WEIGHT BEARING RESTRICTIONS No  PAIN:  Are you having pain? No  FALLS: Has patient fallen in last 6 months? Yes, 1 fall out of bed at the time of the stroke, and one fall out of bed just after returning home from the hospital.   LIVING ENVIRONMENT: Lives with: lives with their family  Son  Alyssa Mayo Lives in: House/apartment Main living are on one floor Stairs:  2 steps to enter Has following equipment at home: Single point cane, Wheelchair (manual), shower chair, Grab bars, bed rail, and rubber mat.  PLOF: Independent  PATIENT GOALS: To be able to Drive, and cook again  OBJECTIVE:   HAND DOMINANCE: Right   ADLs: Overall ADLs:  Transfers/ambulation related to ADLs: Independent Eating: Independent Grooming: Pt. Fatigues with sustained BUE's in elevation for haircare. Especially with blow drying hair. UB Dressing: Independent pullover shirt, independent now with fastening a bra LB Dressing: Independent donning pants socks, and slide on shoes Toileting: Independent Bathing: Independent Tub Shower transfers: Walk-in shower Independent now   IADLs: Shopping: Needs to be accompanied to the grocery store. Light housekeeping: Does own laundry, laundry,  Meal Prep:  Difficulty with cooking Community mobility: Relies on son, and friend Medication management: Son sets up Building surveyor management: Manages monthly bills independently. Handwriting: No change from baseline  MOBILITY STATUS: Hx of falls out of bed   ACTIVITY TOLERANCE: Activity tolerance:  10-20 min. Before rest break  FUNCTIONAL OUTCOME  MEASURES: FOTO: 57  TR score: 62  UPPER EXTREMITY ROM     Active ROM Right eval Left eval  Shoulder flexion [facility] 96(110)  Shoulder abduction [facility] 82(105)  Shoulder adduction    Shoulder extension    Shoulder internal rotation    Shoulder external rotation    Elbow flexion [facility] WFL  Elbow extension [facility]  Wrist flexion    Wrist extension WFL 44(60  Wrist ulnar deviation WFL 32(60)  Wrist radial deviation    Wrist pronation    Wrist supination    (Blank rows = not tested)   UPPER EXTREMITY MMT:     MMT Right eval Left eval  Shoulder flexion 4+/5 3-/5  Shoulder abduction 4+/5 3-/5  Shoulder adduction    Shoulder extension    Shoulder internal rotation    Shoulder external rotation    Middle trapezius    Lower trapezius    Elbow flexion 4+/5 4-/5  Elbow extension 4+/5 4-/5  Wrist flexion 4+/5 3-/5  Wrist extension 4+/5 3/5  Wrist ulnar deviation    Wrist radial deviation    Wrist pronation    Wrist supination    (Blank rows = not tested)  HAND FUNCTION: Grip strength: Right: 30 lbs; Left: 13 lbs, Lateral pinch: Right:  15 lbs, Left: 8 lbs, and 3 point pinch: Right: 13 lbs, Left: 7 lbs  COORDINATION: 9 Hole Peg test: Right: 26  sec; Left: 1 min. & 9 sec  SENSATION: Light touch: WFL Proprioception: WFL  COGNITION: Overall cognitive status: Within functional limits for tasks assessed  VISION: Subjective report: Glasses. Just received a new prescription to increase bifocal strength Baseline vision: Macular Degeneration Visual history: macular degeneration  VISION ASSESSMENT: To be further assessed in functional context  Left sided Awareness    PERCEPTION: Limited left sided awareness   TODAY'S TREATMENT:     Neuromuscular re-education:   Pt. performed left [facility] tasks using the Grooved pegboard. Pt. worked on grasping the grooved pegs from a horizontal position, and moving the pegs to a vertical position in the hand to prepare for placing them in the  grooved slot. Pt. worked on translatory movements storing the pegs, and moving them through her hand from the palm to the tip of the 2nd digit, and thumb.  Pt. worked on using the Copy Task for sustaining 3pt. grasp on tweezers. Pt. worked on sustaining grasp on the resistive tweezers while grasping this sticks, and moving them from a horizontal position to a vertical position to prepare for placing them into the pegboard. Pt. required verbal cues, and cues for visual demonstration for wrist position, and hand pattern when placing them into the pegboard.   Pt. Continues to make progress overall, and is now engaging her left hand during more tasks at home. Pt. reports that it is easier now to hold items with her left hand. Pt. continues to have difficulty engaging her her left hand  during [facility] tasks, and using it to assist with placing her hearing aides in place. Pt. Required increased time to complete the task. Pt. dropped a few  grooved pegs when performing translatory movements with the left hand. Pt. had increased difficulty manipulating the 1" thin pegs with the tweezers.  Pt. continues to work on improving left UE strength, and coordination skills in order to work towards improving engagement of her LUE in, and maximizing independence with ADLs, and IADLs.    PATIENT EDUCATION: Education details: Kitchen safety/use of external memory aids to turn off burners, limit distractions when in kitchen Person educated: Patient Education method: verbal cues Education comprehension: verbalized understanding and pt does not plan to cook unless son is present.   HOME EXERCISE PROGRAM:  Continue with an ongoing assessment of HEP needs    GOALS: Goals reviewed with patient? Yes   SHORT TERM GOALS: Target date:  04/15/2022      Pt. Will improve FOTO score by 2 points to reflect improved pt. perceived functional performance Baseline: FOTO: 57, TR score: 62 Goal status:  INITIAL  LONG TERM GOALS: Target date: 05/27/2022    Pt. Will improve left UE strength by 2 mm grades to assist with repositioning her grandson on her lap. Baseline: Eval: left shoulder flexion: 3-/5, abduction 3-/5,  elbow flex/ext. 4-/5, wrist extension 3/5, wrist flexion 3-/5  Goal status: INITIAL  2.  Pt. will improve left grip strength by 5# to be able to independently hold and use a blow dryer, and brush for hair care. Baseline: Eval: Left grip strength 13#. Pt. Has difficulty performing hair care, holding and using a brush, and blow dryer. Goal status: INITIAL  3.  Pt. Will improve left lateral pinch strength by 3# to be able to independently cut meat. Baseline: Eval: Left 8, Pt. Has difficulty  cutting meat Goal status: INITIAL  4.  Pt. Will improve Left hand [facility] skills to be able to be able to independently, and efficiently manipulate small objects during ADL tasks. Baseline: Eval: left: 1 min. 9 sec. Goal status: INITIAL  5.  Pt. Will perform light meal preparation with modified independence Baseline: Eval: Pt. Reports having difficulty performing light  meal preparation, and cooking tasks. Goal status: INITIAL  ASSESSMENT:  CLINICAL IMPRESSION:  Pt. Continues to make progress overall, and is now engaging her left hand during more tasks at home. Pt. reports that it is easier now to hold items with her left hand. Pt. continues to have difficulty engaging her her left hand  during [facility] tasks, and using it to assist with placing her hearing aides in place. Pt. required increased time to complete the task. Pt. dropped a few  grooved pegs when performing translatory movements with the left hand. Pt. had increased difficulty manipulating the 1" thin pegs with the tweezers.  Pt. continues to work on improving left UE strength, and coordination skills in order to work towards improving engagement of her LUE in, and maximizing independence with ADLs, and IADLs.   PERFORMANCE DEFICITS in  functional skills including ADLs, IADLs, coordination, dexterity, sensation, ROM, strength, FMC, decreased knowledge of use of DME, vision, and UE functional use, cognitive skills including attention, and psychosocial skills including coping strategies, environmental adaptation, habits, and routines and behaviors.   IMPAIRMENTS are limiting patient from ADLs, IADLs, and social participation.   COMORBIDITIES may have co-morbidities  that affects occupational performance. Patient will benefit from skilled OT to address above impairments and improve overall function.  MODIFICATION OR ASSISTANCE TO COMPLETE EVALUATION: Min-Moderate modification of tasks or assist with assess necessary to complete an evaluation.  OT OCCUPATIONAL PROFILE AND HISTORY: Detailed assessment: Review of records and additional review of physical, cognitive, psychosocial history related to current functional performance.  CLINICAL DECISION MAKING: Moderate - several treatment options, min-mod task modification necessary  REHAB POTENTIAL: Good  EVALUATION COMPLEXITY: Moderate    PLAN: OT FREQUENCY: 2x/week  OT DURATION: 12 weeks  PLANNED INTERVENTIONS: self care/ADL training, therapeutic exercise, therapeutic activity, neuromuscular re-education, manual therapy, passive range of motion, and paraffin  RECOMMENDED OTHER SERVICES: ST, and PT  CONSULTED AND AGREED WITH PLAN OF CARE: Patient  PLAN FOR NEXT SESSION:  L hand strengthening and coordination exercises  Olegario Messier, MS, OTR/L   Olegario Messier, MS, OTR/L

## 2022-04-30 ENCOUNTER — Ambulatory Visit (INDEPENDENT_AMBULATORY_CARE_PROVIDER_SITE_OTHER): Payer: Medicare HMO

## 2022-04-30 ENCOUNTER — Encounter: Payer: Medicare HMO | Admitting: Speech Pathology

## 2022-04-30 DIAGNOSIS — I639 Cerebral infarction, unspecified: Secondary | ICD-10-CM

## 2022-05-01 ENCOUNTER — Ambulatory Visit: Payer: Medicare HMO

## 2022-05-01 ENCOUNTER — Encounter: Payer: Self-pay | Admitting: Occupational Therapy

## 2022-05-01 ENCOUNTER — Ambulatory Visit: Payer: Medicare HMO | Admitting: Speech Pathology

## 2022-05-01 ENCOUNTER — Ambulatory Visit: Payer: Medicare HMO | Admitting: Occupational Therapy

## 2022-05-01 DIAGNOSIS — R41841 Cognitive communication deficit: Secondary | ICD-10-CM | POA: Diagnosis not present

## 2022-05-01 DIAGNOSIS — R278 Other lack of coordination: Secondary | ICD-10-CM

## 2022-05-01 DIAGNOSIS — I63511 Cerebral infarction due to unspecified occlusion or stenosis of right middle cerebral artery: Secondary | ICD-10-CM

## 2022-05-01 DIAGNOSIS — M6281 Muscle weakness (generalized): Secondary | ICD-10-CM

## 2022-05-01 DIAGNOSIS — R2681 Unsteadiness on feet: Secondary | ICD-10-CM

## 2022-05-01 DIAGNOSIS — R482 Apraxia: Secondary | ICD-10-CM

## 2022-05-01 DIAGNOSIS — E119 Type 2 diabetes mellitus without complications: Secondary | ICD-10-CM

## 2022-05-01 DIAGNOSIS — R262 Difficulty in walking, not elsewhere classified: Secondary | ICD-10-CM

## 2022-05-01 LAB — CUP PACEART REMOTE DEVICE CHECK
Date Time Interrogation Session: 20231011142334
Implantable Pulse Generator Implant Date: 20230116

## 2022-05-01 NOTE — Therapy (Signed)
OUTPATIENT PHYSICAL THERAPY NEURO TREATMENT   Patient Name: Alyssa Mayo MRN: [id number] DOB:Dec 27, 1945, 76 y.o., female Today's Date: 05/01/2022   PCP: [name], MD REFERRING PROVIDER: [name], NP   PT End of Session - 05/01/22 1244     Visit Number 6    Number of Visits 25    Date for PT Re-Evaluation 06/25/22    Authorization Type [employer]    Authorization Time Period 04/02/2022-06/25/2022    Progress Note Due on Visit 10    PT Start Time 1147    PT Stop Time 1230    PT Time Calculation (min) 43 min    Equipment Utilized During Treatment Gait belt    Activity Tolerance Patient tolerated treatment well;Patient limited by pain    Behavior During Therapy [facility] for tasks assessed/performed               Past Medical History:  Diagnosis Date   Abnormal levels of other serum enzymes 07/17/2015   Arthritis    Constipation 07/11/2015   COVID-19 03/06/2021   Diabetes mellitus without complication (HCC)    Elevated liver enzymes    Fatty liver    Generalized abdominal pain 07/11/2015   Hyperlipidemia    Hypertension    Lifelong obesity    Macular degeneration    Morbid obesity due to excess calories (HCC) 07/11/2015   Other fatigue 07/11/2015   Sleep apnea    Spinal headache    Past Surgical History:  Procedure Laterality Date   ABDOMINAL HYSTERECTOMY     APPENDECTOMY     CATARACT EXTRACTION     CERVICAL LAMINECTOMY  07/21/1985   CESAREAN SECTION     COLONOSCOPY  07/21/2012   diverticulosis, [facility] Dr. [name]    COLONOSCOPY WITH PROPOFOL N/A 02/04/2019   Procedure: COLONOSCOPY WITH PROPOFOL;  Surgeon: [name], MD;  Location: [facility] ENDOSCOPY;  Service: Endoscopy;  Laterality: N/A;   COLONOSCOPY WITH PROPOFOL N/A 03/18/2021   Procedure: COLONOSCOPY WITH PROPOFOL;  Surgeon: [name], MD;  Location: [facility] ENDOSCOPY;  Service: Endoscopy;  Laterality: N/A;  DM   DIAGNOSTIC LAPAROSCOPY     ESOPHAGOGASTRODUODENOSCOPY (EGD)  WITH PROPOFOL N/A 02/04/2019   Procedure: ESOPHAGOGASTRODUODENOSCOPY (EGD) WITH PROPOFOL;  Surgeon: [name], MD;  Location: [facility] ENDOSCOPY;  Service: Endoscopy;  Laterality: N/A;   ESOPHAGOGASTRODUODENOSCOPY (EGD) WITH PROPOFOL N/A 03/18/2021   Procedure: ESOPHAGOGASTRODUODENOSCOPY (EGD) WITH PROPOFOL;  Surgeon: [name], MD;  Location: [facility] ENDOSCOPY;  Service: Endoscopy;  Laterality: N/A;   LOOP RECORDER INSERTION N/A 08/05/2021   Procedure: LOOP RECORDER INSERTION;  Surgeon: [name], MD;  Location: [facility] INVASIVE CV LAB;  Service: Cardiovascular;  Laterality: N/A;   SPINE SURGERY     TUBAL LIGATION     Patient Active Problem List   Diagnosis Date Noted   Cerebral edema (HCC) 08/07/2021   OSA (obstructive sleep apnea) 08/07/2021   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   CVA (cerebral vascular accident) (HCC) 08/01/2021   GERD (gastroesophageal reflux disease) 07/20/2019   Advanced care planning/counseling discussion 12/17/2016   Skin lesions, generalized 11/13/2016   Chronic right hip pain 06/17/2016   Vitamin D deficiency 09/26/2015   Diastasis recti 08/08/2015   Elevated serum GGT level 07/24/2015   Essential hypertension 07/17/2015   Fatty liver 07/17/2015   Elevated alkaline phosphatase level 07/17/2015   Elevated serum glutamic pyruvic transaminase (SGPT) level 07/17/2015   Abnormal finding on EKG 07/11/2015   Morbid obesity (HCC) 07/11/2015   Colon,  diverticulosis 07/11/2015   Abdominal wall hernia 07/11/2015   DM (diabetes mellitus), type 2 with neurological complications [facility])    Hyperlipidemia     ONSET DATE: January 11th 2023  REFERRING DIAG:  I69.398,R26.9 (ICD-10-CM) - Gait disturbance, post-stroke  I63.511 (ICD-10-CM) - Right middle cerebral artery stroke (HCC)    THERAPY DIAG:  Muscle weakness (generalized)  Other lack of coordination  Right middle cerebral artery stroke (HCC)  Unsteadiness on feet  Cognitive  communication deficit  Apraxia  Difficulty in walking, not elsewhere classified  Diabetes mellitus without complication (HCC)  Rationale for Evaluation and Treatment Rehabilitation  SUBJECTIVE:                                                                                                                                                                                              SUBJECTIVE STATEMENT: No knee pain today but does report quad soreness since last visit. No falls, no LOB/stumbles.   Pt accompanied by: self and son, [name]  PERTINENT HISTORY: Per chart and confirmed by pt: Pt is a 76 yo female with RMCA CVA infarction with hemorrhagic transformation 08/01/2021, with inpatient rehab 08/07/2021-08/22/2021 followed by [facility] therapy until June. Pt now currently being seen for outpatient OT and ST services. Pt reports limitations in stamina persist. Other PMH is significant for HTN, HLD, macular degeneration, DM, obesity, sleep apnea, arthritis, chronic R hip pain, vitamin D deficiency, diastasis recti, abdominal wall hernia, hx of abdominal hysterectomy, appendectomy, spine surgery (years ago).   PAIN:  Are you having pain? No    TODAY'S TREATMENT:  Neuro Re-Ed:  Alternating step taps onto 6" step: 2x10/LE, 1.5# AW. Supervision with no UE support; mirror for proprioceptive feedback  Forward and Side ways airex pad step ups: 2x10/LE, 1.5# AW. SBA for safety; mirror for proprioceptive feedback, no UE support  3 cone of varying colors adding cognitive task of placing called out LE to cone color, CGA; mirror for biofeedback  - 1 rep on flat surface, no UE support, x61min  - 1 rep on airex, UUE support, x50min  STS with airex pad under feet, ball between legs, no UE support: x8    Obstacle course on red pad, obstacle with hedgehogs (turns), picking up cones from ground 2 laps  Obstacle course on red pad, obstacle with hedgehogs (turns), Ball toss 4 laps  NBOS cross body reaching, no  UE support  - Romberg stance x1 min  - Tandem stance bil x48min ea  Not performed today: Obstacle course: hurdle step over --> 1/2 bolster step over --> hurdle step over: x4 laps. Min to mod VC's for  LLE first to improve hip flexion weakness. CGA. Alternating mini lunges at // bars for with emphasis on power for LE to return to starting position; cues for shorter step length; BUE support, supervision. Mirror for biofeedback   PATIENT EDUCATION: Education details: exam findings, indications, plan, HEP Person educated: Patient Education method: Programmer, multimedia, Demonstration, Verbal cues, and Handouts Education comprehension: verbalized understanding, returned demonstration, verbal cues required, and needs further education   HOME EXERCISE PROGRAM: 05/01/22: Pt and son educated to perform NBOS cross body reaching at home, at [employer]  Access Code: [email or web address] URL: [email or web address] Date: 04/02/2022 Prepared by: Temple Pacini  Exercises - Seated March  - 1 x daily - 5 x weekly - 3 sets - 15 reps - Standing Single Leg Stance with Counter Support  - 1 x daily - 7 x weekly - 2 sets - 2 reps - 30 seconds hold    GOALS: Goals reviewed with patient? Yes  SHORT TERM GOALS: Target date: 06/12/2022  Patient will be independent in home exercise program to improve strength/mobility for better functional independence with ADLs. Baseline: initiated Goal status: INITIAL   LONG TERM GOALS: Target date: 07/24/2022  Patient will increase FOTO score to equal to or greater than 65  to demonstrate improvement in mobility and quality of life.  Baseline: 57 Goal status: INITIAL  2.  Patient (> 32 years old) will complete five times sit to stand test in < 15 seconds indicating an increased LE strength and improved balance. Baseline: 17 sec hands-free Goal status: INITIAL  3.   Patient will increase 10 meter walk test to >1.91m/s as to improve gait speed for better community  ambulation and to reduce fall risk. Baseline: 0.66 m/s Goal status: INITIAL  4.  Patient will increase Berg Balance score by > 3 points to demonstrate decreased fall risk during functional activities. Baseline: 50/56 Goal status: INITIAL  5.  Patient will increase six minute walk test distance to >1000 for progression to community ambulator and improve gait ability Baseline: 04/10/22: 638ft (pain onset at 5 minutes)  Goal status: INITIAL    ASSESSMENT:  CLINICAL IMPRESSION: Pt tolerated treatment well today. Able to progress standing balance interventions without use of UE for support. Demonstrates appropriate hip/ankle and stepping righting reaction strategies to prevent fall risk. Improved proprioception with cone taps and step ups, but continues to require intermittent cueing for motor planning/grading with steps. Increased difficulty with STS on airex pad, with ball for adductor squeeze; relying more on momentum for facilitation of stand. Increased c/o R paraspinal tightness with step ups that improved with STM to TrP. Pt will continue to benefit from skilled OPPT services for return to baseline function.   OBJECTIVE IMPAIRMENTS Abnormal gait, decreased activity tolerance, decreased balance, decreased coordination, decreased endurance, decreased mobility, difficulty walking, decreased strength, impaired sensation, impaired UE functional use, improper body mechanics, postural dysfunction, and pain.   ACTIVITY LIMITATIONS carrying, lifting, bending, standing, squatting, stairs, bed mobility, bathing, reach over head, hygiene/grooming, and locomotion level  PARTICIPATION LIMITATIONS: meal prep, cleaning, laundry, medication management, personal finances, driving, shopping, community activity, and yard work  PERSONAL FACTORS Age, Sex, Time since onset of injury/illness/exacerbation, and 3+ comorbidities: Other PMH is significant for HTN, HLD, macular degeneration, DM, obesity, sleep apnea,  arthritis, chronic R hip pain, vitamin D deficiency, diastasis recti, abdominal wall hernia, hx of abdominal hysterectomy, appendectomy, spine surgery (years ago).   are also affecting patient's functional outcome.   REHAB POTENTIAL: Good  CLINICAL DECISION MAKING: Evolving/moderate complexity  EVALUATION COMPLEXITY: Moderate  PLAN: PT FREQUENCY: 2x/week  PT DURATION: 12 weeks  PLANNED INTERVENTIONS: Therapeutic exercises, Therapeutic activity, Neuromuscular re-education, Balance training, Gait training, Patient/Family education, Self Care, Joint mobilization, Joint manipulation, Stair training, Vestibular training, Canalith repositioning, Orthotic/Fit training, DME instructions, Dry Needling, Electrical stimulation, Wheelchair mobility training, Spinal mobilization, Cryotherapy, Moist heat, Splintting, Taping, Traction, Ultrasound, Biofeedback, Manual therapy, and Re-evaluation  PLAN FOR NEXT SESSION: multidirectional reaching coupled with cognitive/balance, strength, balance, gait    Herminio Commons, PT, DPT 12:55 PM,05/01/22 Physical Therapist - [facility]

## 2022-05-01 NOTE — Therapy (Signed)
OUTPATIENT OCCUPATIONAL THERAPY TREATMENT NOTE  Patient Name: Alyssa Mayo MRN: [id number] DOB:1946/03/02, 76 y.o., female Today's Date: 03/14/2022  PCP: [place] Pink, MD REFERRING PROVIDER: Frann Rider, NP   OT End of Session - 05/01/22 1118     Visit Number 9    Number of Visits 24    Date for OT Re-Evaluation 05/27/22    Authorization Type Progress reporting period starting 03/04/2022    OT Start Time 1105    OT Stop Time 1145    OT Time Calculation (min) 40 min    Activity Tolerance Patient tolerated treatment well    Behavior During Therapy [facility] for tasks assessed/performed             Past Medical History:  Diagnosis Date   Abnormal levels of other serum enzymes 07/17/2015   Arthritis    Constipation 07/11/2015   COVID-19 03/06/2021   Diabetes mellitus without complication ([place])    Elevated liver enzymes    Fatty liver    Generalized abdominal pain 07/11/2015   Hyperlipidemia    Hypertension    Lifelong obesity    Macular degeneration    Morbid obesity due to excess calories ([place]) 07/11/2015   Other fatigue 07/11/2015   Sleep apnea    Spinal headache    Past Surgical History:  Procedure Laterality Date   ABDOMINAL HYSTERECTOMY     APPENDECTOMY     CATARACT EXTRACTION     CERVICAL LAMINECTOMY  07/21/1985   CESAREAN SECTION     COLONOSCOPY  07/21/2012   diverticulosis, [facility] Dr. Donnella Sham    COLONOSCOPY WITH PROPOFOL N/A 02/04/2019   Procedure: COLONOSCOPY WITH PROPOFOL;  Surgeon: Lollie Sails, MD;  Location: [facility] ENDOSCOPY;  Service: Endoscopy;  Laterality: N/A;   COLONOSCOPY WITH PROPOFOL N/A 03/18/2021   Procedure: COLONOSCOPY WITH PROPOFOL;  Surgeon: Lesly Rubenstein, MD;  Location: [facility] ENDOSCOPY;  Service: Endoscopy;  Laterality: N/A;  DM   DIAGNOSTIC LAPAROSCOPY     ESOPHAGOGASTRODUODENOSCOPY (EGD) WITH PROPOFOL N/A 02/04/2019   Procedure: ESOPHAGOGASTRODUODENOSCOPY (EGD) WITH PROPOFOL;  Surgeon: Lollie Sails, MD;  Location:  [facility] ENDOSCOPY;  Service: Endoscopy;  Laterality: N/A;   ESOPHAGOGASTRODUODENOSCOPY (EGD) WITH PROPOFOL N/A 03/18/2021   Procedure: ESOPHAGOGASTRODUODENOSCOPY (EGD) WITH PROPOFOL;  Surgeon: Lesly Rubenstein, MD;  Location: [facility] ENDOSCOPY;  Service: Endoscopy;  Laterality: N/A;   LOOP RECORDER INSERTION N/A 08/05/2021   Procedure: LOOP RECORDER INSERTION;  Surgeon: Evans Lance, MD;  Location: [place] CV LAB;  Service: Cardiovascular;  Laterality: N/A;   SPINE SURGERY     TUBAL LIGATION     Patient Active Problem List   Diagnosis Date Noted   Cerebral edema ([place]) 08/07/2021   OSA (obstructive sleep apnea) 08/07/2021   Right middle cerebral artery stroke ([place]) 08/07/2021   CVA (cerebral vascular accident) ([place]) 08/01/2021   GERD (gastroesophageal reflux disease) 07/20/2019   Advanced care planning/counseling discussion 12/17/2016   Skin lesions, generalized 11/13/2016   Chronic right hip pain 06/17/2016   Vitamin D deficiency 09/26/2015   Diastasis recti 08/08/2015   Elevated serum GGT level 07/24/2015   Essential hypertension 07/17/2015   Fatty liver 07/17/2015   Elevated alkaline phosphatase level 07/17/2015   Elevated serum glutamic pyruvic transaminase (SGPT) level 07/17/2015   Abnormal finding on EKG 07/11/2015   Morbid obesity ([place]) 07/11/2015   Colon, diverticulosis 07/11/2015   Abdominal wall hernia 07/11/2015   DM (diabetes mellitus), type 2 with neurological complications ([place])    Hyperlipidemia     ONSET DATE:  08/01/2021  REFERRING DIAG: CVA  THERAPY DIAG:  Muscle weakness (generalized)  Other lack of coordination  Rationale for Evaluation and Treatment Rehabilitation  SUBJECTIVE:   SUBJECTIVE STATEMENT:   Pt. Was accompanied by her son for treatment today  Pt accompanied by: self  PERTINENT HISTORY:  Pt. Is a 76 y.o. female  presents with a diagnosis of RMCA CVA infarction with hemorrhagic transformation. Pt. Attended inpatient rehab from  08/07/2021-08/22/2021. Pt. received Home health therapy services this past spring.  Pt. Has recently had an assessment through driver rehabilitation services at [facility] with recommendations for referrals for  outpatient OT/PT, and ST services. Pt. PMHx includes: HTN, Hyperlipidemia, Macular degeneration, DM, and Obesity.  PRECAUTIONS: None  WEIGHT BEARING RESTRICTIONS No  PAIN:  Are you having pain? No  FALLS: Has patient fallen in last 6 months? Yes, 1 fall out of bed at the time of the stroke, and one fall out of bed just after returning home from the hospital.   LIVING ENVIRONMENT: Lives with: lives with their family  Son  Marya Mayo Lives in: House/apartment Main living are on one floor Stairs:  2 steps to enter Has following equipment at home: Single point cane, Wheelchair (manual), shower chair, Grab bars, bed rail, and rubber mat.  PLOF: Independent  PATIENT GOALS: To be able to Drive, and cook again  OBJECTIVE:   HAND DOMINANCE: Right   ADLs: Overall ADLs:  Transfers/ambulation related to ADLs: Independent Eating: Independent Grooming: Pt. Fatigues with sustained BUE's in elevation for haircare. Especially with blow drying hair. UB Dressing: Independent pullover shirt, independent now with fastening a bra LB Dressing: Independent donning pants socks, and slide on shoes Toileting: Independent Bathing: Independent Tub Shower transfers: Walk-in shower Independent now   IADLs: Shopping: Needs to be accompanied to the grocery store. Light housekeeping: Does own laundry, laundry,  Meal Prep:  Difficulty with cooking Community mobility: Relies on son, and friend Medication management: Son sets up Building surveyor management: Manages monthly bills independently. Handwriting: No change from baseline  MOBILITY STATUS: Hx of falls out of bed   ACTIVITY TOLERANCE: Activity tolerance:  10-20 min. Before rest break  FUNCTIONAL OUTCOME MEASURES: FOTO: 57  TR score: 62  UPPER  EXTREMITY ROM     Active ROM Right eval Left eval  Shoulder flexion [facility] 96(110)  Shoulder abduction [facility] 82(105)  Shoulder adduction    Shoulder extension    Shoulder internal rotation    Shoulder external rotation    Elbow flexion [facility] WFL  Elbow extension [facility] WFL  Wrist flexion    Wrist extension WFL 44(60  Wrist ulnar deviation WFL 32(60)  Wrist radial deviation    Wrist pronation    Wrist supination    (Blank rows = not tested)   UPPER EXTREMITY MMT:     MMT Right eval Left eval  Shoulder flexion 4+/5 3-/5  Shoulder abduction 4+/5 3-/5  Shoulder adduction    Shoulder extension    Shoulder internal rotation    Shoulder external rotation    Middle trapezius    Lower trapezius    Elbow flexion 4+/5 4-/5  Elbow extension 4+/5 4-/5  Wrist flexion 4+/5 3-/5  Wrist extension 4+/5 3/5  Wrist ulnar deviation    Wrist radial deviation    Wrist pronation    Wrist supination    (Blank rows = not tested)  HAND FUNCTION: Grip strength: Right: 30 lbs; Left: 13 lbs, Lateral pinch: Right: 15 lbs, Left: 8 lbs, and 3 point pinch: Right:  13 lbs, Left: 7 lbs  COORDINATION: 9 Hole Peg test: Right: 26  sec; Left: 1 min. & 9 sec  SENSATION: Light touch: WFL Proprioception: WFL  COGNITION: Overall cognitive status: Within functional limits for tasks assessed  VISION: Subjective report: Glasses. Just received a new prescription to increase bifocal strength Baseline vision: Macular Degeneration Visual history: macular degeneration  VISION ASSESSMENT: To be further assessed in functional context  Left sided Awareness    PERCEPTION: Limited left sided awareness   TODAY'S TREATMENT:     Neuromuscular re-education:   Pt. worked on left hand [facility] skills manipulating nuts, and bolts on a bolt board. Pt. worked on screwing, and unscrewing nuts, and bolts of varying sizes, and challenging pr items with vision occluded. Pt. worked on [facility] skills grasping 1" sticks, and storing  them in the palm of her left hand. Pt. worked on storing the objects in the palm, and translatory skills moving the items from the palm of the hand to the tip of the 2nd digit, and thumb. Pt. worked on removing the pegs using bilateral alternating hand patterns. Pt. Transitioned to performing left [facility] tasks using the Grooved pegboard. Pt. worked on grasping the grooved pegs from a horizontal position, and storing them in the palm of her left hand.  Pt. Continues to make progress overall, and is now engaging her left hand during more tasks at home. Pt. reports that she is now using her left hand to assist with gripping her covers at night, is able to place her left hand in her left pocket, and is able to take her phone out of her left pocket securely holding it. Pt. continues to have difficulty engaging her her left hand during [facility] tasks, and using it to assist with placing her hearing aides in place.  Pt. had difficulty with moving the 1" purdue sticks from the tips her fingers to her palm, often dropping them between her 2nd, and 3rd digits before they reach her palm. Pt. was able to grasp, and store the grooved pegs, however had more difficulty  with attempting to move them from her palm to the tips of her 2nd digit, and thumb. Pt. Continues to work on improving left UE strength, and coordination skills in order to work towards improving engagement of her LUE in, and maximizing independence with ADLs, and IADLs.    PATIENT EDUCATION: Education details: Kitchen safety/use of external memory aids to turn off burners, limit distractions when in kitchen Person educated: Patient Education method: verbal cues Education comprehension: verbalized understanding and pt does not plan to cook unless son is present.   HOME EXERCISE PROGRAM:  Continue with an ongoing assessment of HEP needs    GOALS: Goals reviewed with patient? Yes   SHORT TERM GOALS: Target date:  04/15/2022      Pt. Will improve FOTO  score by 2 points to reflect improved pt. perceived functional performance Baseline: FOTO: 57, TR score: 62 Goal status: INITIAL  LONG TERM GOALS: Target date: 05/27/2022    Pt. Will improve left UE strength by 2 mm grades to assist with repositioning her grandson on her lap. Baseline: Eval: left shoulder flexion: 3-/5, abduction 3-/5,  elbow flex/ext. 4-/5, wrist extension 3/5, wrist flexion 3-/5  Goal status: INITIAL  2.  Pt. will improve left grip strength by 5# to be able to independently hold and use a blow dryer, and brush for hair care. Baseline: Eval: Left grip strength 13#. Pt. Has difficulty performing hair care, holding  and using a brush, and blow dryer. Goal status: INITIAL  3.  Pt. Will improve left lateral pinch strength by 3# to be able to independently cut meat. Baseline: Eval: Left 8, Pt. Has difficulty cutting meat Goal status: INITIAL  4.  Pt. Will improve Left hand [facility] skills to be able to be able to independently, and efficiently manipulate small objects during ADL tasks. Baseline: Eval: left: 1 min. 9 sec. Goal status: INITIAL  5.  Pt. Will perform light meal preparation with modified independence Baseline: Eval: Pt. Reports having difficulty performing light  meal preparation, and cooking tasks. Goal status: INITIAL  ASSESSMENT:  CLINICAL IMPRESSION:  Pt. Continues to make progress overall, and is now engaging her left hand during more tasks at home. Pt. reports that she is now using her left hand to assist with gripping her covers at night, is able to place her left hand in her left pocket, and is able to take her phone out of her left pocket securely holding it. Pt. continues to have difficulty engaging her her left hand during [facility] tasks, and using it to assist with placing her hearing aides in place.  Pt. had difficulty with moving the 1" purdue sticks from the tips her fingers to her palm, often dropping them between her 2nd, and 3rd digits before they reach  her palm. Pt. was able to grasp, and store the grooved pegs, however had more difficulty  with attempting to move them from her palm to the tips of her 2nd digit, and thumb. Pt. Continues to work on improving left UE strength, and coordination skills in order to work towards improving engagement of her LUE in, and maximizing independence with ADLs, and IADLs.    PERFORMANCE DEFICITS in functional skills including ADLs, IADLs, coordination, dexterity, sensation, ROM, strength, FMC, decreased knowledge of use of DME, vision, and UE functional use, cognitive skills including attention, and psychosocial skills including coping strategies, environmental adaptation, habits, and routines and behaviors.   IMPAIRMENTS are limiting patient from ADLs, IADLs, and social participation.   COMORBIDITIES may have co-morbidities  that affects occupational performance. Patient will benefit from skilled OT to address above impairments and improve overall function.  MODIFICATION OR ASSISTANCE TO COMPLETE EVALUATION: Min-Moderate modification of tasks or assist with assess necessary to complete an evaluation.  OT OCCUPATIONAL PROFILE AND HISTORY: Detailed assessment: Review of records and additional review of physical, cognitive, psychosocial history related to current functional performance.  CLINICAL DECISION MAKING: Moderate - several treatment options, min-mod task modification necessary  REHAB POTENTIAL: Good  EVALUATION COMPLEXITY: Moderate    PLAN: OT FREQUENCY: 2x/week  OT DURATION: 12 weeks  PLANNED INTERVENTIONS: self care/ADL training, therapeutic exercise, therapeutic activity, neuromuscular re-education, manual therapy, passive range of motion, and paraffin  RECOMMENDED OTHER SERVICES: ST, and PT  CONSULTED AND AGREED WITH PLAN OF CARE: Patient  PLAN FOR NEXT SESSION:  L hand strengthening and coordination exercises  Harrel Carina, MS, OTR/L   Harrel Carina, MS, OTR/L

## 2022-05-02 NOTE — Therapy (Signed)
OUTPATIENT SPEECH LANGUAGE PATHOLOGY TREATMENT NOTE   Patient Name: Alyssa Mayo MRN: [id number] DOB:Jul 01, 1946, 76 y.o., female Today's Date: 05/02/2022  PCP: [place] Pink, MD REFERRING PROVIDER: Frann Rider, NP  END OF SESSION:   End of Session - 05/02/22 0929     Visit Number 7    Number of Visits 25    Date for SLP Re-Evaluation 05/27/22    Authorization Type [employer] HMO/PPO    Progress Note Due on Visit 10    SLP Start Time 1000    SLP Stop Time  1100    SLP Time Calculation (min) 60 min    Activity Tolerance Patient tolerated treatment well             Past Medical History:  Diagnosis Date   Abnormal levels of other serum enzymes 07/17/2015   Arthritis    Constipation 07/11/2015   COVID-19 03/06/2021   Diabetes mellitus without complication ([place])    Elevated liver enzymes    Fatty liver    Generalized abdominal pain 07/11/2015   Hyperlipidemia    Hypertension    Lifelong obesity    Macular degeneration    Morbid obesity due to excess calories ([place]) 07/11/2015   Other fatigue 07/11/2015   Sleep apnea    Spinal headache    Past Surgical History:  Procedure Laterality Date   ABDOMINAL HYSTERECTOMY     APPENDECTOMY     CATARACT EXTRACTION     CERVICAL LAMINECTOMY  07/21/1985   CESAREAN SECTION     COLONOSCOPY  07/21/2012   diverticulosis, [facility] Dr. Donnella Sham    COLONOSCOPY WITH PROPOFOL N/A 02/04/2019   Procedure: COLONOSCOPY WITH PROPOFOL;  Surgeon: Lollie Sails, MD;  Location: [facility] ENDOSCOPY;  Service: Endoscopy;  Laterality: N/A;   COLONOSCOPY WITH PROPOFOL N/A 03/18/2021   Procedure: COLONOSCOPY WITH PROPOFOL;  Surgeon: Lesly Rubenstein, MD;  Location: [facility] ENDOSCOPY;  Service: Endoscopy;  Laterality: N/A;  DM   DIAGNOSTIC LAPAROSCOPY     ESOPHAGOGASTRODUODENOSCOPY (EGD) WITH PROPOFOL N/A 02/04/2019   Procedure: ESOPHAGOGASTRODUODENOSCOPY (EGD) WITH PROPOFOL;  Surgeon: Lollie Sails, MD;  Location: [facility] ENDOSCOPY;   Service: Endoscopy;  Laterality: N/A;   ESOPHAGOGASTRODUODENOSCOPY (EGD) WITH PROPOFOL N/A 03/18/2021   Procedure: ESOPHAGOGASTRODUODENOSCOPY (EGD) WITH PROPOFOL;  Surgeon: Lesly Rubenstein, MD;  Location: [facility] ENDOSCOPY;  Service: Endoscopy;  Laterality: N/A;   LOOP RECORDER INSERTION N/A 08/05/2021   Procedure: LOOP RECORDER INSERTION;  Surgeon: Evans Lance, MD;  Location: [place] CV LAB;  Service: Cardiovascular;  Laterality: N/A;   SPINE SURGERY     TUBAL LIGATION     Patient Active Problem List   Diagnosis Date Noted   Cerebral edema ([place]) 08/07/2021   OSA (obstructive sleep apnea) 08/07/2021   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/07/2021   CVA (cerebral vascular accident) ([place]) 08/01/2021   GERD (gastroesophageal reflux disease) 07/20/2019   Advanced care planning/counseling discussion 12/17/2016   Skin lesions, generalized 11/13/2016   Chronic right hip pain 06/17/2016   Vitamin D deficiency 09/26/2015   Diastasis recti 08/08/2015   Elevated serum GGT level 07/24/2015   Essential hypertension 07/17/2015   Fatty liver 07/17/2015   Elevated alkaline phosphatase level 07/17/2015   Elevated serum glutamic pyruvic transaminase (SGPT) level 07/17/2015   Abnormal finding on EKG 07/11/2015   Morbid obesity ([place]) 07/11/2015   Colon, diverticulosis 07/11/2015   Abdominal wall hernia 07/11/2015   DM (diabetes mellitus), type 2 with neurological complications ([place])    Hyperlipidemia  ONSET DATE: 08/01/2021 CVA; 02/26/2022 date of referral   REFERRING DIAG: 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 female with history of diabetes, hypertension, hyperlipidemia, obesity, and sleep apnea who presented to [facility] ED on 08/01/2021 after being found down by her son. Her last know well was approximately 07/29/2021. Pt transferred to [facility] for  further CVA management. In addition, pt received ST services in CIR from 08/08/2021 thru 08/21/2021.      DIAGNOSTIC FINDINGS: MRI showed extensive restricted diffusion throughout much of the right MCA territory with associated changes of hemorrhagic transformation and localized cerebral edema and mass effect. Per Dr. Leonie Man, felt embolic pattern secondary to unclear source although concerning for occult A fib. Repeat [facility] 1/14 showed no interval progression of right MCA hemorrhagic infarct with petechial hemorrhage.  CTA head/neck showed right M2 thrombus.  EF 60 to 65%.   THERAPY DIAG:  Cognitive communication deficit  Right middle cerebral artery stroke [facility])  Rationale for Evaluation and Treatment Rehabilitation  SUBJECTIVE: Pt accompanied by her son, she reports chronic problem ov misplacing items such as bills.   Pt accompanied by: family member  PAIN:  Are you having pain? No  PATIENT GOALS to be able to drive again and live independently (she currently lives alone but her son plans on moving in with her)  OBJECTIVE:   TODAY'S TREATMENT: Skilled treatment session focused on pt's cognitive communication goals. SLP facilitated the session by providing the following interventions:  Minimal assistance for redirection to task Moderate faded to minimal assistance to organize medicine into BID  Minimal assistance to recall use of problem solving strategies  Maximal assistance for task initiation to get up and leave session at end, pt sat for multiple minutes despite continuous reminders from her son     PATIENT EDUCATION: Education details: Animator Person educated: Patient and Child(ren) Education method: Explanation, Demonstration, Verbal cues, and Handouts Education comprehension: needs further education  GOALS: Goals reviewed with patient? Yes   SHORT TERM GOALS: Target date: 10 sessions   With moderate assistance, pt will recall her medicines with 80%  accuracy.  Baseline: currently unable Goal status: INITIAL   2.  With minimal assistance, pt will complete basic medication management task with > 90% accuracy.  Baseline: total Goal status: INITIAL   3.  Pt will demonstrate selective attention to basic task for 15 minutes with < 2 redirections.  Baseline: moderate assistance Goal status: INITIAL   4.  Pt will complete basic bill paying task with 75% accuracy.  Baseline: total Goal status: INITIAL     LONG TERM GOALS: Target date: 05/27/2022 With rare min A, pt will organize her medicines accurately in a pill organizer.  Baseline: Total Goal status: INITIAL   2.  With rare min A, pt will demonstrate selective attention to task for 30 minutes.  Baseline: moderate Goal status: INITIAL   3.  With rare min A, pt will sequence basic meal prep activity.  Baseline: Moderate Goal status: INITIAL    ASSESSMENT:  CLINICAL IMPRESSION: While pt continues to present with moderate cognitive communication impairments, she presents with improved ability to utilize compensatory problem solving strategies to achieve improved accuracy with medication management. Her acute deficits include increased distractibility, decreased task initiation, self-awareness f deficits and possible visual perceptual deficits.   OBJECTIVE IMPAIRMENTS include attention and executive functioning. These impairments are limiting patient  from managing medications, managing appointments, managing finances, household responsibilities, and ADLs/IADLs. Factors affecting potential to achieve goals and functional outcome are ability to learn/carryover information. Patient will benefit from skilled SLP services to address above impairments and improve overall function.  REHAB POTENTIAL: Good  PLAN: SLP FREQUENCY: 1-2x/week  SLP DURATION: 12 weeks  PLANNED INTERVENTIONS: Functional tasks, SLP instruction and feedback, and Patient/family education   Raegen Tarpley B. Rutherford Nail,  M.S., CCC-SLP, Mining engineer Certified Brain Injury [place]  [place] Office [phone] Ascom [phone] Fax [phone]

## 2022-05-06 ENCOUNTER — Ambulatory Visit: Payer: Medicare HMO | Admitting: Occupational Therapy

## 2022-05-06 ENCOUNTER — Encounter: Payer: Medicare HMO | Admitting: Speech Pathology

## 2022-05-06 ENCOUNTER — Ambulatory Visit: Payer: Medicare HMO | Admitting: Speech Pathology

## 2022-05-06 ENCOUNTER — Ambulatory Visit: Payer: Medicare HMO | Admitting: Physical Therapy

## 2022-05-06 ENCOUNTER — Ambulatory Visit: Payer: Medicare HMO

## 2022-05-06 DIAGNOSIS — R278 Other lack of coordination: Secondary | ICD-10-CM

## 2022-05-06 DIAGNOSIS — R262 Difficulty in walking, not elsewhere classified: Secondary | ICD-10-CM

## 2022-05-06 DIAGNOSIS — M6281 Muscle weakness (generalized): Secondary | ICD-10-CM

## 2022-05-06 DIAGNOSIS — R2681 Unsteadiness on feet: Secondary | ICD-10-CM

## 2022-05-06 DIAGNOSIS — R482 Apraxia: Secondary | ICD-10-CM

## 2022-05-06 DIAGNOSIS — R41841 Cognitive communication deficit: Secondary | ICD-10-CM | POA: Diagnosis not present

## 2022-05-06 NOTE — Therapy (Addendum)
Occupational Therapy Progress Note  Dates of reporting period  03/04/2022   to   05/06/2022   Patient Name: Alyssa [facility] MRN: [id number] DOB:Jul 30, 1945, 76 y.o., female Today's Date: 03/14/2022  PCP: Alyssa Mina, MD REFERRING PROVIDER: [name], NP   OT End of Session - 05/06/22 1147     Visit Number 10    Number of Visits 24    Date for OT Re-Evaluation 05/27/22    Authorization Type Progress reporting period starting 03/04/2022    OT Start Time 1145    OT Stop Time 1230    OT Time Calculation (min) 45 min    Activity Tolerance Patient tolerated treatment well    Behavior During Therapy [facility] for tasks assessed/performed             Past Medical History:  Diagnosis Date   Abnormal levels of other serum enzymes 07/17/2015   Arthritis    Constipation 07/11/2015   COVID-19 03/06/2021   Diabetes mellitus without complication (HCC)    Elevated liver enzymes    Fatty liver    Generalized abdominal pain 07/11/2015   Hyperlipidemia    Hypertension    Lifelong obesity    Macular degeneration    Morbid obesity due to excess calories (HCC) 07/11/2015   Other fatigue 07/11/2015   Sleep apnea    Spinal headache    Past Surgical History:  Procedure Laterality Date   ABDOMINAL HYSTERECTOMY     APPENDECTOMY     CATARACT EXTRACTION     CERVICAL LAMINECTOMY  07/21/1985   CESAREAN SECTION     COLONOSCOPY  07/21/2012   diverticulosis, Alyssa [facility]    COLONOSCOPY WITH PROPOFOL N/A 02/04/2019   Procedure: COLONOSCOPY WITH PROPOFOL;  Surgeon: Alyssa Deem, MD;  Location: [facility];  Service: [facility];  Laterality: N/A;   COLONOSCOPY WITH PROPOFOL N/A 03/18/2021   Procedure: COLONOSCOPY WITH PROPOFOL;  Surgeon: Alyssa Bill, MD;  Location: [facility];  Service: [facility];  Laterality: N/A;  DM   DIAGNOSTIC LAPAROSCOPY     ESOPHAGOGASTRODUODENOSCOPY (EGD) WITH PROPOFOL N/A 02/04/2019   Procedure: ESOPHAGOGASTRODUODENOSCOPY (EGD) WITH PROPOFOL;   Surgeon: Alyssa Deem, MD;  Location: [facility];  Service: [facility];  Laterality: N/A;   ESOPHAGOGASTRODUODENOSCOPY (EGD) WITH PROPOFOL N/A 03/18/2021   Procedure: ESOPHAGOGASTRODUODENOSCOPY (EGD) WITH PROPOFOL;  Surgeon: Alyssa Bill, MD;  Location: [facility];  Service: [facility];  Laterality: N/A;   LOOP RECORDER INSERTION N/A 08/05/2021   Procedure: LOOP RECORDER INSERTION;  Surgeon: Alyssa Maw, MD;  [facility]: [facility];  Service: Cardiovascular;  Laterality: N/A;   SPINE SURGERY     TUBAL LIGATION     Patient Active Problem List   Diagnosis Date Noted   Cerebral edema (HCC) 08/07/2021   OSA (obstructive sleep apnea) 08/07/2021   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   CVA (cerebral vascular accident) (HCC) 08/01/2021   GERD (gastroesophageal reflux disease) 07/20/2019   Advanced care planning/counseling discussion 12/17/2016   Skin lesions, generalized 11/13/2016   Chronic right hip pain 06/17/2016   Vitamin D deficiency 09/26/2015   Diastasis recti 08/08/2015   Elevated serum GGT level 07/24/2015   Essential hypertension 07/17/2015   Fatty liver 07/17/2015   Elevated alkaline phosphatase level 07/17/2015   Elevated serum glutamic pyruvic transaminase (SGPT) level 07/17/2015   Abnormal finding on EKG 07/11/2015   Morbid obesity (HCC) 07/11/2015   Colon, diverticulosis 07/11/2015   Abdominal wall hernia 07/11/2015   DM (diabetes mellitus), type 2 with  neurological complications ([place])    Hyperlipidemia     ONSET DATE: 08/01/2021  REFERRING DIAG: CVA  THERAPY DIAG:  Muscle weakness (generalized)  Other lack of coordination  Rationale for Evaluation and Treatment Rehabilitation  SUBJECTIVE:   SUBJECTIVE STATEMENT:   Pt. reports that her son dropped her off, and will pick her up at the front [facility] of the [facility] as usual.   Pt accompanied by: self  PERTINENT HISTORY:  Pt. Is a 76 y.o. female  presents with a  diagnosis of RMCA CVA infarction with hemorrhagic transformation. Pt. Attended inpatient rehab from 08/07/2021-08/22/2021. Pt. received Home health therapy services this past spring.  Pt. Has recently had an assessment through driver rehabilitation services at [facility] with recommendations for referrals for  outpatient OT/PT, and ST services. Pt. PMHx includes: HTN, Hyperlipidemia, Macular degeneration, DM, and Obesity.  PRECAUTIONS: None  WEIGHT BEARING RESTRICTIONS No  PAIN:  Are you having pain? No  FALLS: Has patient fallen in last 6 months? Yes, 1 fall out of bed at the time of the stroke, and one fall out of bed just after returning home from the [facility].   LIVING ENVIRONMENT: Lives with: lives with their family  Son  Alyssa [facility] Lives in: House/apartment Main living are on one floor Stairs:  2 steps to enter Has following equipment at home: Single point cane, Wheelchair (manual), shower chair, Grab bars, bed rail, and rubber mat.  PLOF: Independent  PATIENT GOALS: To be able to Drive, and cook again  OBJECTIVE:   HAND DOMINANCE: Right   ADLs: Overall ADLs:  Transfers/ambulation related to ADLs: Independent Eating: Independent Grooming: Pt. Fatigues with sustained BUE's in elevation for haircare. Especially with blow drying hair. UB Dressing: Independent pullover shirt, independent now with fastening a bra LB Dressing: Independent donning pants socks, and slide on shoes Toileting: Independent Bathing: Independent Tub Shower transfers: Walk-in shower Independent now   IADLs: Shopping: Needs to be accompanied to the grocery store. Light housekeeping: Does own laundry, laundry,  Meal Prep:  Difficulty with cooking Community mobility: Relies on son, and friend Medication management: Son sets up Building surveyor management: Manages monthly bills independently. Handwriting: No change from baseline  MOBILITY STATUS: Hx of falls out of bed   ACTIVITY TOLERANCE: Activity tolerance:   10-20 min. Before rest break  FUNCTIONAL OUTCOME MEASURES: FOTO: 57  TR score: 62  UPPER EXTREMITY ROM     Active ROM Right eval Left eval Left 05/06/2022  Shoulder flexion WFL 96(110) 107(110)  Shoulder abduction WFL 82(105) 86(105)  Shoulder adduction     Shoulder extension     Shoulder internal rotation     Shoulder external rotation     Elbow flexion [facility] WFL  Elbow extension [facility] WFL  Wrist flexion WFL 44(60) 68(68)  Wrist extension WFL 32(60 66(66)  Wrist ulnar deviation     Wrist radial deviation     Wrist pronation     Wrist supination     (Blank rows = not tested)   UPPER EXTREMITY MMT:     MMT Right eval Left eval Left 05/06/2022  Shoulder flexion 4+/5 3-/5 3+/5  Shoulder abduction 4+/5 3-/5 3-/5  Shoulder adduction     Shoulder extension     Shoulder internal rotation     Shoulder external rotation     Middle trapezius     Lower trapezius     Elbow flexion 4+/5 4-/5 4+/5  Elbow extension 4+/5 4-/5 4+/5  Wrist flexion 4+/5 3-/5 4/5  Wrist extension 4+/5 3/5 4/5  Wrist ulnar deviation     Wrist radial deviation     Wrist pronation     Wrist supination     (Blank rows = not tested)  HAND FUNCTION: Grip strength: Right: 30 lbs; Left: 13 lbs, Lateral pinch: Right: 15 lbs, Left: 8 lbs, and 3 point pinch: Right: 13 lbs, Left: 7 lbs  05/06/2022 Grip strength: Right: 30 lbs; Left: 14 lbs, Lateral pinch: Right: 15 lbs, Left: 10 lbs, and 3 point pinch: Right: 13 lbs, Left: 7 lbs   COORDINATION: 9 Hole Peg test: Right: 26  sec; Left: 1 min. & 9 sec  05/06/2022 9 Hole Peg test: Right: 26  sec; Left: 1 min.  SENSATION: Light touch: WFL Proprioception: WFL  COGNITION: Overall cognitive status: Within functional limits for tasks assessed  VISION: Subjective report: Glasses. Just received a new prescription to increase bifocal strength Baseline vision: Macular Degeneration Visual history: macular degeneration  VISION ASSESSMENT: To be  further assessed in functional context  Left sided Awareness    PERCEPTION: Limited left sided awareness   TODAY'S TREATMENT:   Measurements were obtained, and goals were reviewed with the Pt. Pt. has made progress with shoulder ROM, strength, grip strength, pinch strength, and FMC skills. Pt. Is improving with cutting meat that is tender, holding a blow dryer to with the left hand, manipulating small objects with the left hand, and is improving with light meal preparation making sandwiches, using the microwave to heat up items, and making coffee Pt. Is  using her left hand to assist with gripping her covers at night, is able to place her left hand in her left pocket, and is able to take her phone out of her left pocket securely holding it. Pt. continues to have difficulty engaging her her left hand during [facility] tasks, and using it to assist with placing her hearing aides in place.  FOTO score is 65. Pt. Continues to work on improving left UE strength, and coordination skills in order to work towards improving engagement of her LUE in, and maximizing independence with ADLs, and IADLs.    PATIENT EDUCATION: Education details: Kitchen safety/use of external memory aids to turn off burners, limit distractions when in kitchen Person educated: Patient Education method: verbal cues Education comprehension: verbalized understanding and pt does not plan to cook unless son is present.   HOME EXERCISE PROGRAM:  Continue with an ongoing assessment of HEP needs    GOALS: Goals reviewed with patient? Yes   SHORT TERM GOALS: Target date:  04/15/2022      Pt. Will improve FOTO score by 2 points to reflect improved pt. perceived functional performance Baseline: FOTO: 57, TR score: 62 10th visit: FOTO: 65, TR score: 62 Goal status: Achieved  LONG TERM GOALS: Target date: 05/27/2022    Pt. Will improve left UE strength by 2 mm grades to assist with repositioning her grandson on her lap. Baseline:  Eval: left shoulder flexion: 3-/5, abduction 3-/5,  elbow flex/ext. 4-/5, wrist extension 3/5, wrist flexion 3-/5  10th visit: left shoulder flexion: 3+/5, abduction 3-+5,  elbow flex/ext. 4+/5, wrist extension 4/5, wrist flexion 34/5  Goal status: Ongoing  2.  Pt. will improve left grip strength by 5# to be able to independently hold and use a blow dryer, and brush for hair care. Baseline: Eval: Left grip strength 13#. Pt. Has difficulty performing hair care, holding and using a brush, and blow dryer. 10th visit: Left grip 14#. Pt. Is  now able to hold th e blow dryer for the sides, and top of head, not the back. Goal status: Ongoing  3.  Pt. Will improve left lateral pinch strength by 3# to be able to independently cut meat. Baseline: Eval: Left 8, Pt. Has difficulty cutting meat. 10th: Left: 10#.  Pt. Is able to cut tender meat, however is not as efficient Goal status: Ongoing  4.  Pt. Will improve Left hand [facility] skills to be able to be able to independently, and efficiently manipulate small objects during ADL tasks. Baseline: Eval: left: 1 min. 9 sec. 10th visit: Left: 1 min. Pt. Is improving with manipulating small objects.  Goal status: Ongoing   5.  Pt. Will perform light meal preparation with modified independence Baseline: Eval: Pt. Reports having difficulty performing light  meal preparation, and cooking tasks. 10th visit: Pt. Is able to make toast in the morning, a sandwich,  use the microwave, make coffee, and pour herself a drink.  Goal status: Ongoing.   ASSESSMENT:  CLINICAL IMPRESSION:  Measurements were obtained, and goals were reviewed with the Pt. Pt. has made progress with shoulder ROM, strength, grip strength, pinch strength, and FMC skills. Pt. Is improving with cutting meat that is tender, holding a blow dryer to with the left hand, manipulating small objects with the left hand, and is improving with light meal preparation making sandwiches, using the microwave to heat up  items, and making coffee Pt. Is  using her left hand to assist with gripping her covers at night, is able to place her left hand in her left pocket, and is able to take her phone out of her left pocket securely holding it. Pt. continues to have difficulty engaging her her left hand during [facility] tasks, and using it to assist with placing her hearing aides in place.  FOTO score is 65. Pt. Continues to work on improving left UE strength, and coordination skills in order to work towards improving engagement of her LUE in, and maximizing independence with ADLs, and IADLs.   PERFORMANCE DEFICITS in functional skills including ADLs, IADLs, coordination, dexterity, sensation, ROM, strength, FMC, decreased knowledge of use of DME, vision, and UE functional use, cognitive skills including attention, and psychosocial skills including coping strategies, environmental adaptation, habits, and routines and behaviors.   IMPAIRMENTS are limiting patient from ADLs, IADLs, and social participation.   COMORBIDITIES may have co-morbidities  that affects occupational performance. Patient will benefit from skilled OT to address above impairments and improve overall function.  MODIFICATION OR ASSISTANCE TO COMPLETE EVALUATION: Min-Moderate modification of tasks or assist with assess necessary to complete an evaluation.  OT OCCUPATIONAL PROFILE AND HISTORY: Detailed assessment: Review of records and additional review of physical, cognitive, psychosocial history related to current functional performance.  CLINICAL DECISION MAKING: Moderate - several treatment options, min-mod task modification necessary  REHAB POTENTIAL: Good  EVALUATION COMPLEXITY: Moderate    PLAN: OT FREQUENCY: 2x/week  OT DURATION: 12 weeks  PLANNED INTERVENTIONS: self care/ADL training, therapeutic exercise, therapeutic activity, neuromuscular re-education, manual therapy, passive range of motion, and paraffin  RECOMMENDED OTHER SERVICES: ST,  and PT  CONSULTED AND AGREED WITH PLAN OF CARE: Patient  PLAN FOR NEXT SESSION:  L hand strengthening and coordination exercises  Olegario Messier, MS, OTR/L   Olegario Messier, MS, OTR/L

## 2022-05-06 NOTE — Therapy (Signed)
OUTPATIENT PHYSICAL THERAPY NEURO TREATMENT   Patient Name: Alyssa Mayo MRN: [id number] DOB:[birth year], 76 y.o., female Today's Date: 05/06/2022   PCP: [name], MD REFERRING PROVIDER: [name], NP   PT End of Session - 05/06/22 1224     Visit Number 7    Number of Visits 25    Date for PT Re-Evaluation 06/25/22    Authorization Type [employer]    Authorization Time Period 04/02/2022-06/25/2022    Progress Note Due on Visit 10    PT Start Time 1101    PT Stop Time 1147    PT Time Calculation (min) 46 min    Equipment Utilized During Treatment Gait belt    Activity Tolerance Patient tolerated treatment well;Patient limited by pain    Behavior During Therapy [facility] for tasks assessed/performed                Past Medical History:  Diagnosis Date   Abnormal levels of other serum enzymes 07/17/2015   Arthritis    Constipation 07/11/2015   COVID-19 03/06/2021   Diabetes mellitus without complication (HCC)    Elevated liver enzymes    Fatty liver    Generalized abdominal pain 07/11/2015   Hyperlipidemia    Hypertension    Lifelong obesity    Macular degeneration    Morbid obesity due to excess calories (HCC) 07/11/2015   Other fatigue 07/11/2015   Sleep apnea    Spinal headache    Past Surgical History:  Procedure Laterality Date   ABDOMINAL HYSTERECTOMY     APPENDECTOMY     CATARACT EXTRACTION     CERVICAL LAMINECTOMY  07/21/1985   CESAREAN SECTION     COLONOSCOPY  07/21/2012   diverticulosis, [facility] Dr. [name]    COLONOSCOPY WITH PROPOFOL N/A 02/04/2019   Procedure: COLONOSCOPY WITH PROPOFOL;  Surgeon: [name], MD;  Location: [facility] ENDOSCOPY;  Service: Endoscopy;  Laterality: N/A;   COLONOSCOPY WITH PROPOFOL N/A 03/18/2021   Procedure: COLONOSCOPY WITH PROPOFOL;  Surgeon: [name], MD;  Location: [facility] ENDOSCOPY;  Service: Endoscopy;  Laterality: N/A;  DM   DIAGNOSTIC LAPAROSCOPY     ESOPHAGOGASTRODUODENOSCOPY (EGD)  WITH PROPOFOL N/A 02/04/2019   Procedure: ESOPHAGOGASTRODUODENOSCOPY (EGD) WITH PROPOFOL;  Surgeon: [name], MD;  Location: [facility] ENDOSCOPY;  Service: Endoscopy;  Laterality: N/A;   ESOPHAGOGASTRODUODENOSCOPY (EGD) WITH PROPOFOL N/A 03/18/2021   Procedure: ESOPHAGOGASTRODUODENOSCOPY (EGD) WITH PROPOFOL;  Surgeon: [name], MD;  Location: [facility] ENDOSCOPY;  Service: Endoscopy;  Laterality: N/A;   LOOP RECORDER INSERTION N/A 08/05/2021   Procedure: LOOP RECORDER INSERTION;  Surgeon: [name], MD;  Location: [facility] INVASIVE CV LAB;  Service: Cardiovascular;  Laterality: N/A;   SPINE SURGERY     TUBAL LIGATION     Patient Active Problem List   Diagnosis Date Noted   Cerebral edema (HCC) 08/07/2021   OSA (obstructive sleep apnea) 08/07/2021   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   CVA (cerebral vascular accident) (HCC) 08/01/2021   GERD (gastroesophageal reflux disease) 07/20/2019   Advanced care planning/counseling discussion 12/17/2016   Skin lesions, generalized 11/13/2016   Chronic right hip pain 06/17/2016   Vitamin D deficiency 09/26/2015   Diastasis recti 08/08/2015   Elevated serum GGT level 07/24/2015   Essential hypertension 07/17/2015   Fatty liver 07/17/2015   Elevated alkaline phosphatase level 07/17/2015   Elevated serum glutamic pyruvic transaminase (SGPT) level 07/17/2015   Abnormal finding on EKG 07/11/2015   Morbid obesity (HCC) 07/11/2015  Colon, diverticulosis 07/11/2015   Abdominal wall hernia 07/11/2015   DM (diabetes mellitus), type 2 with neurological complications [facility])    Hyperlipidemia     ONSET DATE: January 11th 2023  REFERRING DIAG:  I69.398,R26.9 (ICD-10-CM) - Gait disturbance, post-stroke  I63.511 (ICD-10-CM) - Right middle cerebral artery stroke (HCC)    THERAPY DIAG:  Difficulty in walking, not elsewhere classified  Muscle weakness (generalized)  Other lack of coordination  Unsteadiness on  feet  Apraxia  Rationale for Evaluation and Treatment Rehabilitation  SUBJECTIVE:                                                                                                                                                                                              SUBJECTIVE STATEMENT: Pt reporting some R hip pain upon arrival.  She notes 2/10 pain in the hip and states that it's just from "old age".  She doesn't feel like it's slowing her down.  Pt accompanied by: self  PERTINENT HISTORY: Per chart and confirmed by pt: Pt is a 76 yo female with RMCA CVA infarction with hemorrhagic transformation 08/01/2021, with inpatient rehab 08/07/2021-08/22/2021 followed by [facility] therapy until June. Pt now currently being seen for outpatient OT and ST services. Pt reports limitations in stamina persist. Other PMH is significant for HTN, HLD, macular degeneration, DM, obesity, sleep apnea, arthritis, chronic R hip pain, vitamin D deficiency, diastasis recti, abdominal wall hernia, hx of abdominal hysterectomy, appendectomy, spine surgery (years ago).   PAIN:  Are you having pain? Yes; R hip 2/10    TODAY'S TREATMENT:  Neuro Re-Ed:  Alternating step taps onto 6" step: 2x10/LE, 1.5# AW. Supervision with no UE support; mirror for proprioceptive feedback  Forward and Side ways airex pad step ups: 2x10/LE, 1.5# AW. SBA for safety; no UE support  Lateral weight shifts on BOSU ball, x10 each side with minimal UE support on support bar  Mini squats on BOSU ball, x10 with minimal UE support on support bar  STS with airex pad under feet, ball between legs, no UE support: x8  Hedgehog taps with B LE's and therapist calling out colors (red, green yellow) for improved SLS and dynamic balance, 2x10 each LE  Ambulation with self ball toss, 1 lap in hallway  Ambulation with lateral ball toss to therapist, 1 lap each direction in hallway  Ambulation with cross body for physioball, 2 laps in hallway   Not  performed today: Obstacle course: hurdle step over --> 1/2 bolster step over --> hurdle step over: x4 laps. Min to mod VC's for LLE first to improve hip flexion weakness. CGA. Alternating  mini lunges at // bars for with emphasis on power for LE to return to starting position; cues for shorter step length; BUE support, supervision. Mirror for biofeedback   PATIENT EDUCATION: Education details: exam findings, indications, plan, HEP Person educated: Patient Education method: Consulting civil engineer, Demonstration, Verbal cues, and Handouts Education comprehension: verbalized understanding, returned demonstration, verbal cues required, and needs further education   HOME EXERCISE PROGRAM: 05/01/22: Pt and son educated to perform NBOS cross body reaching at home, at [employer]  Access Code: [email or web address] URL: [email or web address] Date: 04/02/2022 Prepared by: Ricard Dillon  Exercises - Seated March  - 1 x daily - 5 x weekly - 3 sets - 15 reps - Standing Single Leg Stance with Counter Support  - 1 x daily - 7 x weekly - 2 sets - 2 reps - 30 seconds hold    GOALS: Goals reviewed with patient? Yes  SHORT TERM GOALS: Target date: 06/17/2022  Patient will be independent in home exercise program to improve strength/mobility for better functional independence with ADLs. Baseline: initiated Goal status: INITIAL   LONG TERM GOALS: Target date: 07/29/2022  Patient will increase FOTO score to equal to or greater than 65  to demonstrate improvement in mobility and quality of life.  Baseline: 57 Goal status: INITIAL  2.  Patient (> 4 years old) will complete five times sit to stand test in < 15 seconds indicating an increased LE strength and improved balance. Baseline: 17 sec hands-free Goal status: INITIAL  3.  Patient will increase 10 meter walk test to >1.90m/s as to improve gait speed for better community ambulation and to reduce fall risk. Baseline: 0.66 m/s Goal status: INITIAL  4.   Patient will increase Berg Balance score by > 3 points to demonstrate decreased fall risk during functional activities. Baseline: 50/56 Goal status: INITIAL  5.  Patient will increase six minute walk test distance to >1000 for progression to community ambulator and improve gait ability Baseline: 04/10/22: 64ft (pain onset at 5 minutes)  Goal status: INITIAL    ASSESSMENT:  CLINICAL IMPRESSION: Pt put forth good effort throughout the session today.  Pt noted to have improved balance with tasks and was able to handle more dynamic balance exercises such as the BOSU ball exercises.  Pt also noted to have reduction in painful symptoms of the R hip at conclusion of therapy session.  Pt notes that she is no longer in pain and was advised to continue with current HEP and POC in order to improve her functional mobility.   Pt will continue to benefit from skilled therapy to address remaining deficits in order to improve overall QoL and return to PLOF.     OBJECTIVE IMPAIRMENTS Abnormal gait, decreased activity tolerance, decreased balance, decreased coordination, decreased endurance, decreased mobility, difficulty walking, decreased strength, impaired sensation, impaired UE functional use, improper body mechanics, postural dysfunction, and pain.   ACTIVITY LIMITATIONS carrying, lifting, bending, standing, squatting, stairs, bed mobility, bathing, reach over head, hygiene/grooming, and locomotion level  PARTICIPATION LIMITATIONS: meal prep, cleaning, laundry, medication management, personal finances, driving, shopping, community activity, and yard work  PERSONAL FACTORS Age, Sex, Time since onset of injury/illness/exacerbation, and 3+ comorbidities: Other PMH is significant for HTN, HLD, macular degeneration, DM, obesity, sleep apnea, arthritis, chronic R hip pain, vitamin D deficiency, diastasis recti, abdominal wall hernia, hx of abdominal hysterectomy, appendectomy, spine surgery (years ago).   are  also affecting patient's functional outcome.   REHAB POTENTIAL: Good  CLINICAL DECISION MAKING: Evolving/moderate complexity  EVALUATION COMPLEXITY: Moderate  PLAN: PT FREQUENCY: 2x/week  PT DURATION: 12 weeks  PLANNED INTERVENTIONS: Therapeutic exercises, Therapeutic activity, Neuromuscular re-education, Balance training, Gait training, Patient/Family education, Self Care, Joint mobilization, Joint manipulation, Stair training, Vestibular training, Canalith repositioning, Orthotic/Fit training, DME instructions, Dry Needling, Electrical stimulation, Wheelchair mobility training, Spinal mobilization, Cryotherapy, Moist heat, Splintting, Taping, Traction, Ultrasound, Biofeedback, Manual therapy, and Re-evaluation  PLAN FOR NEXT SESSION: multidirectional reaching coupled with cognitive/balance, strength, balance, gait    Nolon Bussing, PT, DPT 05/06/22, 12:25 PM Physical Therapist - [facility]

## 2022-05-08 ENCOUNTER — Ambulatory Visit: Payer: Medicare HMO | Admitting: Occupational Therapy

## 2022-05-08 ENCOUNTER — Ambulatory Visit: Payer: Medicare HMO

## 2022-05-08 ENCOUNTER — Ambulatory Visit: Payer: Medicare HMO | Admitting: Speech Pathology

## 2022-05-08 DIAGNOSIS — R262 Difficulty in walking, not elsewhere classified: Secondary | ICD-10-CM

## 2022-05-08 DIAGNOSIS — R482 Apraxia: Secondary | ICD-10-CM

## 2022-05-08 DIAGNOSIS — R278 Other lack of coordination: Secondary | ICD-10-CM

## 2022-05-08 DIAGNOSIS — R41841 Cognitive communication deficit: Secondary | ICD-10-CM | POA: Diagnosis not present

## 2022-05-08 DIAGNOSIS — R2681 Unsteadiness on feet: Secondary | ICD-10-CM

## 2022-05-08 DIAGNOSIS — M6281 Muscle weakness (generalized): Secondary | ICD-10-CM

## 2022-05-08 DIAGNOSIS — I63511 Cerebral infarction due to unspecified occlusion or stenosis of right middle cerebral artery: Secondary | ICD-10-CM

## 2022-05-08 NOTE — Progress Notes (Signed)
Carelink Summary Report / Loop Recorder 

## 2022-05-08 NOTE — Therapy (Signed)
OCCUPATIONAL THERAPYTREATMENT  Patient Name: Alyssa Mayo MRN: [id number] DOB:[birth year], 76 y.o., female Today's Date: 03/14/2022  PCP: [name], MD REFERRING PROVIDER: [name], NP   OT End of Session - 05/08/22 1109     Visit Number 11    Number of Visits 24    Date for OT Re-Evaluation 05/27/22    Authorization Type Progress reporting period starting 03/04/2022    OT Start Time 1100    OT Stop Time 1145    OT Time Calculation (min) 45 min    Activity Tolerance Patient tolerated treatment well    Behavior During Therapy [facility] for tasks assessed/performed             Past Medical History:  Diagnosis Date   Abnormal levels of other serum enzymes 07/17/2015   Arthritis    Constipation 07/11/2015   COVID-19 03/06/2021   Diabetes mellitus without complication (HCC)    Elevated liver enzymes    Fatty liver    Generalized abdominal pain 07/11/2015   Hyperlipidemia    Hypertension    Lifelong obesity    Macular degeneration    Morbid obesity due to excess calories (HCC) 07/11/2015   Other fatigue 07/11/2015   Sleep apnea    Spinal headache    Past Surgical History:  Procedure Laterality Date   ABDOMINAL HYSTERECTOMY     APPENDECTOMY     CATARACT EXTRACTION     CERVICAL LAMINECTOMY  07/21/1985   CESAREAN SECTION     COLONOSCOPY  07/21/2012   diverticulosis, [facility] Dr. [name]    COLONOSCOPY WITH PROPOFOL N/A 02/04/2019   Procedure: COLONOSCOPY WITH PROPOFOL;  Surgeon: [name], MD;  Location: [facility] ENDOSCOPY;  Service: Endoscopy;  Laterality: N/A;   COLONOSCOPY WITH PROPOFOL N/A 03/18/2021   Procedure: COLONOSCOPY WITH PROPOFOL;  Surgeon: [name], MD;  Location: [facility] ENDOSCOPY;  Service: Endoscopy;  Laterality: N/A;  DM   DIAGNOSTIC LAPAROSCOPY     ESOPHAGOGASTRODUODENOSCOPY (EGD) WITH PROPOFOL N/A 02/04/2019   Procedure: ESOPHAGOGASTRODUODENOSCOPY (EGD) WITH PROPOFOL;  Surgeon: [name], MD;  Location: [facility] ENDOSCOPY;   Service: Endoscopy;  Laterality: N/A;   ESOPHAGOGASTRODUODENOSCOPY (EGD) WITH PROPOFOL N/A 03/18/2021   Procedure: ESOPHAGOGASTRODUODENOSCOPY (EGD) WITH PROPOFOL;  Surgeon: [name], MD;  Location: [facility] ENDOSCOPY;  Service: Endoscopy;  Laterality: N/A;   LOOP RECORDER INSERTION N/A 08/05/2021   Procedure: LOOP RECORDER INSERTION;  Surgeon: [name], MD;  Location: [facility] INVASIVE CV LAB;  Service: Cardiovascular;  Laterality: N/A;   SPINE SURGERY     TUBAL LIGATION     Patient Active Problem List   Diagnosis Date Noted   Cerebral edema (HCC) 08/07/2021   OSA (obstructive sleep apnea) 08/07/2021   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   CVA (cerebral vascular accident) (HCC) 08/01/2021   GERD (gastroesophageal reflux disease) 07/20/2019   Advanced care planning/counseling discussion 12/17/2016   Skin lesions, generalized 11/13/2016   Chronic right hip pain 06/17/2016   Vitamin D deficiency 09/26/2015   Diastasis recti 08/08/2015   Elevated serum GGT level 07/24/2015   Essential hypertension 07/17/2015   Fatty liver 07/17/2015   Elevated alkaline phosphatase level 07/17/2015   Elevated serum glutamic pyruvic transaminase (SGPT) level 07/17/2015   Abnormal finding on EKG 07/11/2015   Morbid obesity (HCC) 07/11/2015   Colon, diverticulosis 07/11/2015   Abdominal wall hernia 07/11/2015   DM (diabetes mellitus), type 2 with neurological complications (HCC)    Hyperlipidemia     ONSET DATE: 08/01/2021  REFERRING  DIAG: CVA  THERAPY DIAG:  Muscle weakness (generalized)  Other lack of coordination  Rationale for Evaluation and Treatment Rehabilitation  SUBJECTIVE:   SUBJECTIVE STATEMENT:   Pt. reports t that she has a busy weekend with her grandson's birthday festivities.   Pt accompanied by: self  PERTINENT HISTORY:  Pt. Is a 76 y.o. female  presents with a diagnosis of RMCA CVA infarction with hemorrhagic transformation. Pt. Attended inpatient  rehab from 08/07/2021-08/22/2021. Pt. received Home health therapy services this past spring.  Pt. Has recently had an assessment through driver rehabilitation services at [facility] with recommendations for referrals for  outpatient OT/PT, and ST services. Pt. PMHx includes: HTN, Hyperlipidemia, Macular degeneration, DM, and Obesity.  PRECAUTIONS: None  WEIGHT BEARING RESTRICTIONS No  PAIN:  Are you having pain? No  FALLS: Has patient fallen in last 6 months? Yes, 1 fall out of bed at the time of the stroke, and one fall out of bed just after returning home from the hospital.   LIVING ENVIRONMENT: Lives with: lives with their family  Son  [name] Lives in: House/apartment Main living are on one floor Stairs:  2 steps to enter Has following equipment at home: Single point cane, Wheelchair (manual), shower chair, Grab bars, bed rail, and rubber mat.  PLOF: Independent  PATIENT GOALS: To be able to Drive, and cook again  OBJECTIVE:   HAND DOMINANCE: Right   ADLs: Overall ADLs:  Transfers/ambulation related to ADLs: Independent Eating: Independent Grooming: Pt. Fatigues with sustained BUE's in elevation for haircare. Especially with blow drying hair. UB Dressing: Independent pullover shirt, independent now with fastening a bra LB Dressing: Independent donning pants socks, and slide on shoes Toileting: Independent Bathing: Independent Tub Shower transfers: Walk-in shower Independent now   IADLs: Shopping: Needs to be accompanied to the grocery store. Light housekeeping: Does own laundry, laundry,  Meal Prep:  Difficulty with cooking Community mobility: Relies on son, and friend Medication management: Son sets up Mining engineer management: Manages monthly bills independently. Handwriting: No change from baseline  MOBILITY STATUS: Hx of falls out of bed   ACTIVITY TOLERANCE: Activity tolerance:  10-20 min. Before rest break  FUNCTIONAL OUTCOME MEASURES: FOTO: 57  TR score:  62  UPPER EXTREMITY ROM     Active ROM Right eval Left eval Left 05/06/2022  Shoulder flexion WFL 96(110) 107(110)  Shoulder abduction WFL 82(105) 86(105)  Shoulder adduction     Shoulder extension     Shoulder internal rotation     Shoulder external rotation     Elbow flexion [facility] WFL  Elbow extension [facility] WFL  Wrist flexion WFL 44(60) 68(68)  Wrist extension WFL 32(60 66(66)  Wrist ulnar deviation     Wrist radial deviation     Wrist pronation     Wrist supination     (Blank rows = not tested)   UPPER EXTREMITY MMT:     MMT Right eval Left eval Left 05/06/2022  Shoulder flexion 4+/5 3-/5 3+/5  Shoulder abduction 4+/5 3-/5 3-/5  Shoulder adduction     Shoulder extension     Shoulder internal rotation     Shoulder external rotation     Middle trapezius     Lower trapezius     Elbow flexion 4+/5 4-/5 4+/5  Elbow extension 4+/5 4-/5 4+/5  Wrist flexion 4+/5 3-/5 4/5  Wrist extension 4+/5 3/5 4/5  Wrist ulnar deviation     Wrist radial deviation     Wrist pronation  Wrist supination     (Blank rows = not tested)  HAND FUNCTION: Grip strength: Right: 30 lbs; Left: 13 lbs, Lateral pinch: Right: 15 lbs, Left: 8 lbs, and 3 point pinch: Right: 13 lbs, Left: 7 lbs  05/06/2022 Grip strength: Right: 30 lbs; Left: 14 lbs, Lateral pinch: Right: 15 lbs, Left: 10 lbs, and 3 point pinch: Right: 13 lbs, Left: 7 lbs   COORDINATION: 9 Hole Peg test: Right: 26  sec; Left: 1 min. & 9 sec  05/06/2022 9 Hole Peg test: Right: 26  sec; Left: 1 min.  SENSATION: Light touch: WFL Proprioception: WFL  COGNITION: Overall cognitive status: Within functional limits for tasks assessed  VISION: Subjective report: Glasses. Just received a new prescription to increase bifocal strength Baseline vision: Macular Degeneration Visual history: macular degeneration  VISION ASSESSMENT: To be further assessed in functional context  Left sided Awareness    PERCEPTION: Limited  left sided awareness   TODAY'S TREATMENT:       Therapeutic Exercise:   Pt. performed 2# dowel ex. For UE strengthening secondary to weakness. Bilateral shoulder flexion, and chest presses were performed 1 set 10 reps each.. Pt performed left hand  gross gripping with a gross grip strengthener. Pt worked on sustaining grip while grasping pegs to place into pegboard. The gripper was set to 6.6 # of grip strength resistance. Pt. was able to clear the board, and place the pegs into a container placed at the left of the board on the tabletop. Pt. Worked on pinch strengthening in the left hand for lateral, and 3pt. pinch using yellow, red, and resistive clips. Pt. worked on placing the clips at various vertical and horizontal angles. Tactile and verbal cues were required for eliciting the desired movement.    Neuromuscular re-education:    Pt. worked on tasks to sustain lateral pinch on resistive tweezers while grasping and moving 2" toothpick sticks from a horizontal flat position to a vertical position in order to place it in the holder. Pt. was able to sustain grasp while positioning and extending the wrist/hand in the necessary alignment needed to place the stick through the top of the holder.     Pt.is making progress, and in now engaging her left hand during more tasks. Pt. reports consistently using her left hand now to put the dishes away. However, the Pt. reports that she has has broken several from dropping them. Pt. Reports no pain today. Pt. Reports pain with shoulder shoulder flexion, and horizontal abduction. Pt. Continues to tolerate left  grip strengthening well after adjusting resistance level from 11.9# initially to 6.6#. Pt. Was able to complete 3pt. ,and lateral pinch with cues for hand position. Pt. Continues to work on improving left UE strength, and coordination skills in order to work towards improving engagement of her LUE in, and maximizing independence with ADLs, and IADLs.     PATIENT EDUCATION: Education details: Kitchen safety/use of external memory aids to turn off burners, limit distractions when in kitchen Person educated: Patient Education method: verbal cues Education comprehension: verbalized understanding and pt does not plan to cook unless son is present.   HOME EXERCISE PROGRAM:  Continue with an ongoing assessment of HEP needs    GOALS: Goals reviewed with patient? Yes   SHORT TERM GOALS: Target date:  04/15/2022      Pt. Will improve FOTO score by 2 points to reflect improved pt. perceived functional performance Baseline: FOTO: 57, TR score: 62 10th visit: FOTO: 65, TR score:  62 Goal status: Achieved  LONG TERM GOALS: Target date: 05/27/2022    Pt. Will improve left UE strength by 2 mm grades to assist with repositioning her grandson on her lap. Baseline: Eval: left shoulder flexion: 3-/5, abduction 3-/5,  elbow flex/ext. 4-/5, wrist extension 3/5, wrist flexion 3-/5  10th visit: left shoulder flexion: 3+/5, abduction 3-+5,  elbow flex/ext. 4+/5, wrist extension 4/5, wrist flexion 34/5  Goal status: Ongoing  2.  Pt. will improve left grip strength by 5# to be able to independently hold and use a blow dryer, and brush for hair care. Baseline: Eval: Left grip strength 13#. Pt. Has difficulty performing hair care, holding and using a brush, and blow dryer. 10th visit: Left grip 14#. Pt. Is now able to hold th e blow dryer for the sides, and top of head, not the back. Goal status: Ongoing  3.  Pt. Will improve left lateral pinch strength by 3# to be able to independently cut meat. Baseline: Eval: Left 8, Pt. Has difficulty cutting meat. 10th: Left: 10#.  Pt. Is able to cut tender meat, however is not as efficient Goal status: Ongoing  4.  Pt. Will improve Left hand [facility] skills to be able to be able to independently, and efficiently manipulate small objects during ADL tasks. Baseline: Eval: left: 1 min. 9 sec. 10th visit: Left: 1 min. Pt.  Is improving with manipulating small objects.  Goal status: Ongoing   5.  Pt. Will perform light meal preparation with modified independence Baseline: Eval: Pt. Reports having difficulty performing light  meal preparation, and cooking tasks. 10th visit: Pt. Is able to make toast in the morning, a sandwich,  use the microwave, make coffee, and pour herself a drink.  Goal status: Ongoing.   ASSESSMENT:  CLINICAL IMPRESSION:  Pt.is making progress, and in now engaging her left hand during more tasks. Pt. reports consistently using her left hand now to put the dishes away. However, the Pt. reports that she has has broken several from dropping them. Pt. Reports no pain today. Pt. Reports pain with shoulder shoulder flexion, and horizontal abduction. Pt. Continues to tolerate left  grip strengthening well after adjusting resistance level from 11.9# initially to 6.6#. Pt. Was able to complete 3pt. ,and lateral pinch with cues for hand position. Pt. Continues to work on improving left UE strength, and coordination skills in order to work towards improving engagement of her LUE in, and maximizing independence with ADLs, and IADLs.   PERFORMANCE DEFICITS in functional skills including ADLs, IADLs, coordination, dexterity, sensation, ROM, strength, FMC, decreased knowledge of use of DME, vision, and UE functional use, cognitive skills including attention, and psychosocial skills including coping strategies, environmental adaptation, habits, and routines and behaviors.   IMPAIRMENTS are limiting patient from ADLs, IADLs, and social participation.   COMORBIDITIES may have co-morbidities  that affects occupational performance. Patient will benefit from skilled OT to address above impairments and improve overall function.  MODIFICATION OR ASSISTANCE TO COMPLETE EVALUATION: Min-Moderate modification of tasks or assist with assess necessary to complete an evaluation.  OT OCCUPATIONAL PROFILE AND HISTORY:  Detailed assessment: Review of records and additional review of physical, cognitive, psychosocial history related to current functional performance.  CLINICAL DECISION MAKING: Moderate - several treatment options, min-mod task modification necessary  REHAB POTENTIAL: Good  EVALUATION COMPLEXITY: Moderate    PLAN: OT FREQUENCY: 2x/week  OT DURATION: 12 weeks  PLANNED INTERVENTIONS: self care/ADL training, therapeutic exercise, therapeutic activity, neuromuscular re-education, manual therapy, passive range of  motion, and paraffin  RECOMMENDED OTHER SERVICES: ST, and PT  CONSULTED AND AGREED WITH PLAN OF CARE: Patient  PLAN FOR NEXT SESSION:  L hand strengthening and coordination exercises  Olegario Messier, MS, OTR/L   Olegario Messier, MS, OTR/L

## 2022-05-08 NOTE — Therapy (Signed)
OUTPATIENT PHYSICAL THERAPY NEURO TREATMENT   Patient Name: Alyssa Mayo MRN: [id number] DOB:May 16, 1946, 76 y.o., female Today's Date: 05/08/2022   PCP: [name], MD REFERRING PROVIDER: [name], NP   PT End of Session - 05/08/22 1148     Visit Number 8    Number of Visits 25    Date for PT Re-Evaluation 06/25/22    Authorization Type [employer] Medicare    Authorization Time Period 04/02/2022-06/25/2022    Progress Note Due on Visit 10    PT Start Time 1147    PT Stop Time 1230    PT Time Calculation (min) 43 min    Equipment Utilized During Treatment Gait belt    Activity Tolerance Patient tolerated treatment well;Patient limited by pain    Behavior During Therapy [facility] for tasks assessed/performed              Past Medical History:  Diagnosis Date   Abnormal levels of other serum enzymes 07/17/2015   Arthritis    Constipation 07/11/2015   COVID-19 03/06/2021   Diabetes mellitus without complication (HCC)    Elevated liver enzymes    Fatty liver    Generalized abdominal pain 07/11/2015   Hyperlipidemia    Hypertension    Lifelong obesity    Macular degeneration    Morbid obesity due to excess calories (HCC) 07/11/2015   Other fatigue 07/11/2015   Sleep apnea    Spinal headache    Past Surgical History:  Procedure Laterality Date   ABDOMINAL HYSTERECTOMY     APPENDECTOMY     CATARACT EXTRACTION     CERVICAL LAMINECTOMY  07/21/1985   CESAREAN SECTION     COLONOSCOPY  07/21/2012   diverticulosis, [facility] Dr. [name]    COLONOSCOPY WITH PROPOFOL N/A 02/04/2019   Procedure: COLONOSCOPY WITH PROPOFOL;  Surgeon: [name], MD;  Location: [facility] ENDOSCOPY;  Service: Endoscopy;  Laterality: N/A;   COLONOSCOPY WITH PROPOFOL N/A 03/18/2021   Procedure: COLONOSCOPY WITH PROPOFOL;  Surgeon: [name], MD;  Location: [facility] ENDOSCOPY;  Service: Endoscopy;  Laterality: N/A;  DM   DIAGNOSTIC LAPAROSCOPY     ESOPHAGOGASTRODUODENOSCOPY (EGD) WITH  PROPOFOL N/A 02/04/2019   Procedure: ESOPHAGOGASTRODUODENOSCOPY (EGD) WITH PROPOFOL;  Surgeon: [name], MD;  Location: [facility] ENDOSCOPY;  Service: Endoscopy;  Laterality: N/A;   ESOPHAGOGASTRODUODENOSCOPY (EGD) WITH PROPOFOL N/A 03/18/2021   Procedure: ESOPHAGOGASTRODUODENOSCOPY (EGD) WITH PROPOFOL;  Surgeon: [name], MD;  Location: [facility] ENDOSCOPY;  Service: Endoscopy;  Laterality: N/A;   LOOP RECORDER INSERTION N/A 08/05/2021   Procedure: LOOP RECORDER INSERTION;  Surgeon: [name], MD;  Location: [facility] INVASIVE CV LAB;  Service: Cardiovascular;  Laterality: N/A;   SPINE SURGERY     TUBAL LIGATION     Patient Active Problem List   Diagnosis Date Noted   Cerebral edema (HCC) 08/07/2021   OSA (obstructive sleep apnea) 08/07/2021   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   CVA (cerebral vascular accident) (HCC) 08/01/2021   GERD (gastroesophageal reflux disease) 07/20/2019   Advanced care planning/counseling discussion 12/17/2016   Skin lesions, generalized 11/13/2016   Chronic right hip pain 06/17/2016   Vitamin D deficiency 09/26/2015   Diastasis recti 08/08/2015   Elevated serum GGT level 07/24/2015   Essential hypertension 07/17/2015   Fatty liver 07/17/2015   Elevated alkaline phosphatase level 07/17/2015   Elevated serum glutamic pyruvic transaminase (SGPT) level 07/17/2015   Abnormal finding on EKG 07/11/2015   Morbid obesity (HCC) 07/11/2015   Colon, diverticulosis  07/11/2015   Abdominal wall hernia 07/11/2015   DM (diabetes mellitus), type 2 with neurological complications [facility])    Hyperlipidemia     ONSET DATE: January 11th 2023  REFERRING DIAG:  I69.398,R26.9 (ICD-10-CM) - Gait disturbance, post-stroke  I63.511 (ICD-10-CM) - Right middle cerebral artery stroke (HCC)    THERAPY DIAG:  Difficulty in walking, not elsewhere classified  Muscle weakness (generalized)  Other lack of coordination  Unsteadiness on  feet  Apraxia  Rationale for Evaluation and Treatment Rehabilitation  SUBJECTIVE:                                                                                                                                                                                              SUBJECTIVE STATEMENT: Pt reports she was really tired after the last session due to having multiple appointment with PT, OT, dentist, and then her grandson's soccer game.  She states that she was able to sleep really well that night.  Pt reports no pain upon arrival to the clinic today.    Pt accompanied by: self  PERTINENT HISTORY: Per chart and confirmed by pt:  Pt is a 76 yo female with RMCA CVA infarction with hemorrhagic transformation 08/01/2021, with inpatient rehab 08/07/2021-08/22/2021 followed by [facility] therapy until June. Pt now currently being seen for outpatient OT and ST services. Pt reports limitations in stamina persist. Other PMH is significant for HTN, HLD, macular degeneration, DM, obesity, sleep apnea, arthritis, chronic R hip pain, vitamin D deficiency, diastasis recti, abdominal wall hernia, hx of abdominal hysterectomy, appendectomy, spine surgery (years ago).   PAIN:  Are you having pain? Yes, 2/10 Pain Location: R Hip   TODAY'S TREATMENT:   TherEx:  Ambulation in hallway with subjective gathering and for warm-up, 2 laps Side stepping in hallway for increased glute activation, requiring verbal cuing for proper positioning, x2 laps Backwards ambulation for increased posterior chain activation during ambulation, x2 laps  Seated ankle 4 way with GTB resistance for improved motor control of the ankle musculature, x15 each direction bilaterally Seated marches with BTB resistance applied around distal thigh, 2x15 Resisted crab walks with BTB resistance applied around distal thigh, 2x20 ft each direction      Not performed today: Obstacle course: hurdle step over --> 1/2 bolster step over --> hurdle  step over: x4 laps. Min to mod VC's for LLE first to improve hip flexion weakness. CGA. Alternating mini lunges at // bars for with emphasis on power for LE to return to starting position; cues for shorter step length; BUE support, supervision. Mirror for biofeedback   PATIENT EDUCATION: Education details: exam findings,  indications, plan, HEP Person educated: Patient Education method: Explanation, Demonstration, Verbal cues, and Handouts Education comprehension: verbalized understanding, returned demonstration, verbal cues required, and needs further education   HOME EXERCISE PROGRAM: 05/01/22: Pt and son educated to perform NBOS cross body reaching at home, at [employer] counter  Access Code: [email or web address] URL: [email or web address] Date: 04/02/2022 Prepared by: Temple Pacini  Exercises - Seated March  - 1 x daily - 5 x weekly - 3 sets - 15 reps - Standing Single Leg Stance with Counter Support  - 1 x daily - 7 x weekly - 2 sets - 2 reps - 30 seconds hold    GOALS: Goals reviewed with patient? Yes  SHORT TERM GOALS: Target date: 06/19/2022  Patient will be independent in home exercise program to improve strength/mobility for better functional independence with ADLs. Baseline: initiated Goal status: INITIAL   LONG TERM GOALS: Target date: 07/31/2022  Patient will increase FOTO score to equal to or greater than 65  to demonstrate improvement in mobility and quality of life.  Baseline: 57 Goal status: INITIAL  2.  Patient (> 53 years old) will complete five times sit to stand test in < 15 seconds indicating an increased LE strength and improved balance. Baseline: 17 sec hands-free Goal status: INITIAL  3.  Patient will increase 10 meter walk test to >1.5m/s as to improve gait speed for better community ambulation and to reduce fall risk. Baseline: 0.66 m/s Goal status: INITIAL  4.  Patient will increase Berg Balance score by > 3 points to demonstrate decreased fall  risk during functional activities. Baseline: 50/56 Goal status: INITIAL  5.  Patient will increase six minute walk test distance to >1000 for progression to community ambulator and improve gait ability Baseline: 04/10/22: 629ft (pain onset at 5 minutes)  Goal status: INITIAL    ASSESSMENT:  CLINICAL IMPRESSION: Pt responded well to the exercises given, however needed more frequent cuing for proper performance of the exercises.  Pt noted some fatigue at the end of the session as well and required frequent rest breaks throughout the session.  Pt denied any pain in the hip with exercises today as well.  Pt advised to continue with current HEP and improve reaching/cognitive/balance exercises as necessary.   Pt will continue to benefit from skilled therapy to address remaining deficits in order to improve overall QoL and return to PLOF.      OBJECTIVE IMPAIRMENTS Abnormal gait, decreased activity tolerance, decreased balance, decreased coordination, decreased endurance, decreased mobility, difficulty walking, decreased strength, impaired sensation, impaired UE functional use, improper body mechanics, postural dysfunction, and pain.   ACTIVITY LIMITATIONS carrying, lifting, bending, standing, squatting, stairs, bed mobility, bathing, reach over head, hygiene/grooming, and locomotion level  PARTICIPATION LIMITATIONS: meal prep, cleaning, laundry, medication management, personal finances, driving, shopping, community activity, and yard work  PERSONAL FACTORS Age, Sex, Time since onset of injury/illness/exacerbation, and 3+ comorbidities: Other PMH is significant for HTN, HLD, macular degeneration, DM, obesity, sleep apnea, arthritis, chronic R hip pain, vitamin D deficiency, diastasis recti, abdominal wall hernia, hx of abdominal hysterectomy, appendectomy, spine surgery (years ago).   are also affecting patient's functional outcome.   REHAB POTENTIAL: Good  CLINICAL DECISION MAKING:  Evolving/moderate complexity  EVALUATION COMPLEXITY: Moderate  PLAN: PT FREQUENCY: 2x/week  PT DURATION: 12 weeks  PLANNED INTERVENTIONS: Therapeutic exercises, Therapeutic activity, Neuromuscular re-education, Balance training, Gait training, Patient/Family education, Self Care, Joint mobilization, Joint manipulation, Stair training, Vestibular training, Canalith repositioning, Orthotic/Fit training, DME instructions, Dry Needling, Electrical stimulation,  Wheelchair mobility training, Spinal mobilization, Cryotherapy, Moist heat, Splintting, Taping, Traction, Ultrasound, Biofeedback, Manual therapy, and Re-evaluation  PLAN FOR NEXT SESSION: multidirectional reaching coupled with cognitive/balance, strength, balance, gait    Nolon Bussing, PT, DPT 05/08/22, 5:24 PM Physical Therapist - [facility]

## 2022-05-09 ENCOUNTER — Encounter: Payer: Medicare HMO | Admitting: Speech Pathology

## 2022-05-09 ENCOUNTER — Ambulatory Visit: Payer: Medicare HMO | Admitting: Speech Pathology

## 2022-05-09 DIAGNOSIS — R41841 Cognitive communication deficit: Secondary | ICD-10-CM

## 2022-05-09 DIAGNOSIS — I63511 Cerebral infarction due to unspecified occlusion or stenosis of right middle cerebral artery: Secondary | ICD-10-CM

## 2022-05-09 NOTE — Therapy (Signed)
OUTPATIENT SPEECH LANGUAGE PATHOLOGY TREATMENT NOTE   Patient Name: Alyssa Mayo MRN: [id number] DOB:21-Aug-1945, 76 y.o., female Today's Date: 05/09/2022  PCP: [place] Pink, MD REFERRING PROVIDER: Frann Rider, NP  END OF SESSION:   End of Session - 05/09/22 2356     Visit Number 8    Number of Visits 25    Date for SLP Re-Evaluation 05/27/22    Authorization Type [employer] Medicare HMO/PPO    Progress Note Due on Visit 10    SLP Start Time 1000    SLP Stop Time  1100    SLP Time Calculation (min) 60 min    Activity Tolerance Patient tolerated treatment well             Past Medical History:  Diagnosis Date   Abnormal levels of other serum enzymes 07/17/2015   Arthritis    Constipation 07/11/2015   COVID-19 03/06/2021   Diabetes mellitus without complication ([place])    Elevated liver enzymes    Fatty liver    Generalized abdominal pain 07/11/2015   Hyperlipidemia    Hypertension    Lifelong obesity    Macular degeneration    Morbid obesity due to excess calories ([place]) 07/11/2015   Other fatigue 07/11/2015   Sleep apnea    Spinal headache    Past Surgical History:  Procedure Laterality Date   ABDOMINAL HYSTERECTOMY     APPENDECTOMY     CATARACT EXTRACTION     CERVICAL LAMINECTOMY  07/21/1985   CESAREAN SECTION     COLONOSCOPY  07/21/2012   diverticulosis, [facility] Dr. Donnella Sham    COLONOSCOPY WITH PROPOFOL N/A 02/04/2019   Procedure: COLONOSCOPY WITH PROPOFOL;  Surgeon: Lollie Sails, MD;  Location: [facility] ENDOSCOPY;  Service: Endoscopy;  Laterality: N/A;   COLONOSCOPY WITH PROPOFOL N/A 03/18/2021   Procedure: COLONOSCOPY WITH PROPOFOL;  Surgeon: Lesly Rubenstein, MD;  Location: [facility] ENDOSCOPY;  Service: Endoscopy;  Laterality: N/A;  DM   DIAGNOSTIC LAPAROSCOPY     ESOPHAGOGASTRODUODENOSCOPY (EGD) WITH PROPOFOL N/A 02/04/2019   Procedure: ESOPHAGOGASTRODUODENOSCOPY (EGD) WITH PROPOFOL;  Surgeon: Lollie Sails, MD;  Location: [facility] ENDOSCOPY;   Service: Endoscopy;  Laterality: N/A;   ESOPHAGOGASTRODUODENOSCOPY (EGD) WITH PROPOFOL N/A 03/18/2021   Procedure: ESOPHAGOGASTRODUODENOSCOPY (EGD) WITH PROPOFOL;  Surgeon: Lesly Rubenstein, MD;  Location: [facility] ENDOSCOPY;  Service: Endoscopy;  Laterality: N/A;   LOOP RECORDER INSERTION N/A 08/05/2021   Procedure: LOOP RECORDER INSERTION;  Surgeon: Evans Lance, MD;  Location: [place] CV LAB;  Service: Cardiovascular;  Laterality: N/A;   SPINE SURGERY     TUBAL LIGATION     Patient Active Problem List   Diagnosis Date Noted   Cerebral edema ([place]) 08/07/2021   OSA (obstructive sleep apnea) 08/07/2021   Right middle cerebral artery stroke ([place]) 08/07/2021   CVA (cerebral vascular accident) ([employer]) 08/01/2021   GERD (gastroesophageal reflux disease) 07/20/2019   Advanced care planning/counseling discussion 12/17/2016   Skin lesions, generalized 11/13/2016   Chronic right hip pain 06/17/2016   Vitamin D deficiency 09/26/2015   Diastasis recti 08/08/2015   Elevated serum GGT level 07/24/2015   Essential hypertension 07/17/2015   Fatty liver 07/17/2015   Elevated alkaline phosphatase level 07/17/2015   Elevated serum glutamic pyruvic transaminase (SGPT) level 07/17/2015   Abnormal finding on EKG 07/11/2015   Morbid obesity ([place]) 07/11/2015   Colon, diverticulosis 07/11/2015   Abdominal wall hernia 07/11/2015   DM (diabetes mellitus), type 2 with neurological complications ([place])    Hyperlipidemia  ONSET DATE: 08/01/2021 CVA; 02/26/2022 date of referral   REFERRING DIAG: 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 female with history of diabetes, hypertension, hyperlipidemia, obesity, and sleep apnea who presented to [facility] ED on 08/01/2021 after being found down by her son. Her last know well was approximately 07/29/2021. Pt transferred to [facility] for  further CVA management. In addition, pt received ST services in CIR from 08/08/2021 thru 08/21/2021.      DIAGNOSTIC FINDINGS: MRI showed extensive restricted diffusion throughout much of the right MCA territory with associated changes of hemorrhagic transformation and localized cerebral edema and mass effect. Per Dr. Leonie Man, felt embolic pattern secondary to unclear source although concerning for occult A fib. Repeat [facility] 1/14 showed no interval progression of right MCA hemorrhagic infarct with petechial hemorrhage.  CTA head/neck showed right M2 thrombus.  EF 60 to 65%.   THERAPY DIAG:  Cognitive communication deficit  Right middle cerebral artery stroke [facility])  Rationale for Evaluation and Treatment Rehabilitation  SUBJECTIVE: "I made my own breakfast this morning"  Pt accompanied by: family member  PAIN:  Are you having pain? No  PATIENT GOALS to be able to drive again and live independently (she currently lives alone but her son plans on moving in with her)  OBJECTIVE:   TODAY'S TREATMENT: Skilled treatment session focused on pt's cognitive communication goals. SLP facilitated the session by providing the following interventions:  Tangrams used to target suspected visuospatial and perception deficits. Pt was independent with basic 3 shape patterns but required moderate asisstance and more than a reasonable amount of time when pattern progressed to semi-complex. Specifically, pt had difficulty with color recognition, distractibility (pausing to have conversation), problem solving.   Pt with improve insight into task difficulty by stating the following reasons as to why task was difficult  identifying the correct colors; visual difficulty; problem solving, blocks shifted easily on design  While pt demonstrated improved insight when performing structured therapy activities, she reports that she made her breakfast this morning (including boiling eggs) while her son was not at home. She  states that she stayed in the kitchen and set a timer. In addition, she recognized that "I get tired and then I get slopping." She reports tiredness as being physical and mental. To increase physical endurance, recommend pt use a flat even surface such as the mall to walk.      PATIENT EDUCATION: Education details: Animator Person educated: Patient and Child(ren) Education method: Explanation, Media planner, Verbal cues, and Handouts Education comprehension: needs further education  GOALS: Goals reviewed with patient? Yes   SHORT TERM GOALS: Target date: 10 sessions   With moderate assistance, pt will recall her medicines with 80% accuracy.  Baseline: currently unable Goal status: INITIAL   2.  With minimal assistance, pt will complete basic medication management task with > 90% accuracy.  Baseline: total Goal status: INITIAL   3.  Pt will demonstrate selective attention to basic task for 15 minutes with < 2 redirections.  Baseline: moderate assistance Goal status: INITIAL   4.  Pt will complete basic bill paying task with 75% accuracy.  Baseline: total Goal status: INITIAL     LONG TERM GOALS: Target date: 05/27/2022 With rare min A, pt will organize her medicines accurately in a pill organizer.  Baseline: Total Goal status: INITIAL   2.  With rare min A,  pt will demonstrate selective attention to task for 30 minutes.  Baseline: moderate Goal status: INITIAL   3.  With rare min A, pt will sequence basic meal prep activity.  Baseline: Moderate Goal status: INITIAL    ASSESSMENT:  CLINICAL IMPRESSION: While pt continues to present with moderate cognitive communication impairments, she presents with improved ability to utilize compensatory problem solving strategies to achieve improved accuracy with medication management. Her acute deficits include increased distractibility, decreased task initiation, self-awareness of deficits and possible visual  perceptual deficits as well as recall of directional navigation.   OBJECTIVE IMPAIRMENTS include attention and executive functioning. These impairments are limiting patient from managing medications, managing appointments, managing finances, household responsibilities, and ADLs/IADLs. Factors affecting potential to achieve goals and functional outcome are ability to learn/carryover information. Patient will benefit from skilled SLP services to address above impairments and improve overall function.  REHAB POTENTIAL: Good  PLAN: SLP FREQUENCY: 1-2x/week  SLP DURATION: 12 weeks  PLANNED INTERVENTIONS: Functional tasks, SLP instruction and feedback, and Patient/family education   Elery Cadenhead B. Rutherford Nail, M.S., CCC-SLP, Mining engineer Certified Brain Injury [place]  [place] Office [phone] Ascom [phone] Fax [phone]

## 2022-05-10 NOTE — Therapy (Unsigned)
OUTPATIENT SPEECH LANGUAGE PATHOLOGY TREATMENT NOTE   Patient Name: Alyssa Mayo MRN: [id number] DOB:May 13, 1946, 76 y.o., female Today's Date: 05/10/2022  PCP: [name], MD REFERRING PROVIDER: [name], NP  END OF SESSION:   End of Session - 05/10/22 0002     Visit Number 9    Number of Visits 25    Date for SLP Re-Evaluation 05/27/22    Authorization Type [employer] Medicare HMO/PPO    Progress Note Due on Visit 10    SLP Start Time 0800    SLP Stop Time  0900    SLP Time Calculation (min) 60 min    Activity Tolerance Patient tolerated treatment well             Past Medical History:  Diagnosis Date   Abnormal levels of other serum enzymes 07/17/2015   Arthritis    Constipation 07/11/2015   COVID-19 03/06/2021   Diabetes mellitus without complication (HCC)    Elevated liver enzymes    Fatty liver    Generalized abdominal pain 07/11/2015   Hyperlipidemia    Hypertension    Lifelong obesity    Macular degeneration    Morbid obesity due to excess calories (HCC) 07/11/2015   Other fatigue 07/11/2015   Sleep apnea    Spinal headache    Past Surgical History:  Procedure Laterality Date   ABDOMINAL HYSTERECTOMY     APPENDECTOMY     CATARACT EXTRACTION     CERVICAL LAMINECTOMY  07/21/1985   CESAREAN SECTION     COLONOSCOPY  07/21/2012   diverticulosis, [facility] Dr. [name]    COLONOSCOPY WITH PROPOFOL N/A 02/04/2019   Procedure: COLONOSCOPY WITH PROPOFOL;  Surgeon: [name], MD;  Location: [facility] ENDOSCOPY;  Service: Endoscopy;  Laterality: N/A;   COLONOSCOPY WITH PROPOFOL N/A 03/18/2021   Procedure: COLONOSCOPY WITH PROPOFOL;  Surgeon: [name], MD;  Location: [facility] ENDOSCOPY;  Service: Endoscopy;  Laterality: N/A;  DM   DIAGNOSTIC LAPAROSCOPY     ESOPHAGOGASTRODUODENOSCOPY (EGD) WITH PROPOFOL N/A 02/04/2019   Procedure: ESOPHAGOGASTRODUODENOSCOPY (EGD) WITH PROPOFOL;  Surgeon: [name], MD;  Location: [facility] ENDOSCOPY;   Service: Endoscopy;  Laterality: N/A;   ESOPHAGOGASTRODUODENOSCOPY (EGD) WITH PROPOFOL N/A 03/18/2021   Procedure: ESOPHAGOGASTRODUODENOSCOPY (EGD) WITH PROPOFOL;  Surgeon: [name], MD;  Location: [facility] ENDOSCOPY;  Service: Endoscopy;  Laterality: N/A;   LOOP RECORDER INSERTION N/A 08/05/2021   Procedure: LOOP RECORDER INSERTION;  Surgeon: [name], MD;  Location: [facility] INVASIVE CV LAB;  Service: Cardiovascular;  Laterality: N/A;   SPINE SURGERY     TUBAL LIGATION     Patient Active Problem List   Diagnosis Date Noted   Cerebral edema (HCC) 08/07/2021   OSA (obstructive sleep apnea) 08/07/2021   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   CVA (cerebral vascular accident) (HCC) 08/01/2021   GERD (gastroesophageal reflux disease) 07/20/2019   Advanced care planning/counseling discussion 12/17/2016   Skin lesions, generalized 11/13/2016   Chronic right hip pain 06/17/2016   Vitamin D deficiency 09/26/2015   Diastasis recti 08/08/2015   Elevated serum GGT level 07/24/2015   Essential hypertension 07/17/2015   Fatty liver 07/17/2015   Elevated alkaline phosphatase level 07/17/2015   Elevated serum glutamic pyruvic transaminase (SGPT) level 07/17/2015   Abnormal finding on EKG 07/11/2015   Morbid obesity (HCC) 07/11/2015   Colon, diverticulosis 07/11/2015   Abdominal wall hernia 07/11/2015   DM (diabetes mellitus), type 2 with neurological complications (HCC)    Hyperlipidemia  ONSET DATE: 08/01/2021 CVA; 02/26/2022 date of referral   REFERRING DIAG: 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 female with history of diabetes, hypertension, hyperlipidemia, obesity, and sleep apnea who presented to [facility] ED on 08/01/2021 after being found down by her son. Her last know well was approximately 07/29/2021. Pt transferred to [facility] for  further CVA management. In addition, pt received ST services in CIR from 08/08/2021 thru 08/21/2021.      DIAGNOSTIC FINDINGS: MRI showed extensive restricted diffusion throughout much of the right MCA territory with associated changes of hemorrhagic transformation and localized cerebral edema and mass effect. Per Dr. Leonie Man, felt embolic pattern secondary to unclear source although concerning for occult A fib. Repeat [facility] 1/14 showed no interval progression of right MCA hemorrhagic infarct with petechial hemorrhage.  CTA head/neck showed right M2 thrombus.  EF 60 to 65%.   THERAPY DIAG:  Cognitive communication deficit  Right middle cerebral artery stroke [facility])  Rationale for Evaluation and Treatment Rehabilitation  SUBJECTIVE: "I made my own breakfast this morning"  Pt accompanied by: family member  PAIN:  Are you having pain? No  PATIENT GOALS to be able to drive again and live independently (she currently lives alone but her son plans on moving in with her)  OBJECTIVE:   TODAY'S TREATMENT: Skilled treatment session focused on pt's cognitive communication goals. SLP facilitated the session by providing the following interventions:  Tangrams used to target suspected visuospatial and perception deficits. Pt was independent with basic 3 shape patterns but required moderate asisstance and more than a reasonable amount of time when pattern progressed to semi-complex. Specifically, pt had difficulty with color recognition, distractibility (pausing to have conversation), problem solving.   Pt with improve insight into task difficulty by stating the following reasons as to why task was difficult  identifying the correct colors; visual difficulty; problem solving, blocks shifted easily on design  While pt demonstrated improved insight when performing structured therapy activities, she reports that she made her breakfast this morning (including boiling eggs) while her son was not at home. She  states that she stayed in the kitchen and set a timer. In addition, she recognized that "I get tired and then I get slopping." She reports tiredness as being physical and mental. To increase physical endurance, recommend pt use a flat even surface such as the mall to walk.      PATIENT EDUCATION: Education details: Animator Person educated: Patient and Child(ren) Education method: Explanation, Media planner, Verbal cues, and Handouts Education comprehension: needs further education  GOALS: Goals reviewed with patient? Yes   SHORT TERM GOALS: Target date: 10 sessions   With moderate assistance, pt will recall her medicines with 80% accuracy.  Baseline: currently unable Goal status: INITIAL   2.  With minimal assistance, pt will complete basic medication management task with > 90% accuracy.  Baseline: total Goal status: INITIAL   3.  Pt will demonstrate selective attention to basic task for 15 minutes with < 2 redirections.  Baseline: moderate assistance Goal status: INITIAL   4.  Pt will complete basic bill paying task with 75% accuracy.  Baseline: total Goal status: INITIAL     LONG TERM GOALS: Target date: 05/27/2022 With rare min A, pt will organize her medicines accurately in a pill organizer.  Baseline: Total Goal status: INITIAL   2.  With rare min A,  pt will demonstrate selective attention to task for 30 minutes.  Baseline: moderate Goal status: INITIAL   3.  With rare min A, pt will sequence basic meal prep activity.  Baseline: Moderate Goal status: INITIAL    ASSESSMENT:  CLINICAL IMPRESSION: While pt continues to present with moderate cognitive communication impairments, she presents with improved ability to utilize compensatory problem solving strategies to achieve improved accuracy with medication management. Her acute deficits include increased distractibility, decreased task initiation, self-awareness of deficits and possible visual  perceptual deficits as well as recall of directional navigation.   OBJECTIVE IMPAIRMENTS include attention and executive functioning. These impairments are limiting patient from managing medications, managing appointments, managing finances, household responsibilities, and ADLs/IADLs. Factors affecting potential to achieve goals and functional outcome are ability to learn/carryover information. Patient will benefit from skilled SLP services to address above impairments and improve overall function.  REHAB POTENTIAL: Good  PLAN: SLP FREQUENCY: 1-2x/week  SLP DURATION: 12 weeks  PLANNED INTERVENTIONS: Functional tasks, SLP instruction and feedback, and Patient/family education   Sharrie Self B. Rutherford Nail, M.S., CCC-SLP, Mining engineer Certified Brain Injury [place]  [place] Office [phone] Ascom [phone] Fax [phone]

## 2022-05-13 ENCOUNTER — Ambulatory Visit: Payer: Medicare HMO | Admitting: Speech Pathology

## 2022-05-13 ENCOUNTER — Ambulatory Visit: Payer: Medicare HMO | Admitting: Occupational Therapy

## 2022-05-13 ENCOUNTER — Encounter: Payer: Self-pay | Admitting: Occupational Therapy

## 2022-05-13 DIAGNOSIS — M6281 Muscle weakness (generalized): Secondary | ICD-10-CM

## 2022-05-13 DIAGNOSIS — R41841 Cognitive communication deficit: Secondary | ICD-10-CM

## 2022-05-13 DIAGNOSIS — I63511 Cerebral infarction due to unspecified occlusion or stenosis of right middle cerebral artery: Secondary | ICD-10-CM

## 2022-05-13 NOTE — Therapy (Signed)
OUTPATIENT SPEECH LANGUAGE PATHOLOGY TREATMENT NOTE 10th VISIT PROGRESS NOTE   Patient Name: Alyssa Mayo MRN: [id number] DOB:[birth year], 76 y.o., female Today's Date: 05/13/2022   Speech Therapy Progress Note  Dates of Reporting Period: 03/04/2022 to 05/13/2022  Objective: Patient has been seen for 10 speech therapy sessions this reporting period targeting cognitive communication. Patient is making progress toward LTGs and met 1 STGs this reporting period. See skilled intervention, clinical impressions, and goals below for details.   PCP: [place] Pink, MD REFERRING PROVIDER: Frann Rider, NP  END OF SESSION:   End of Session - 05/13/22 1015     Visit Number 10    Number of Visits 25    Date for SLP Re-Evaluation 05/27/22    Authorization Type [employer] Medicare HMO/PPO    Progress Note Due on Visit 10    SLP Start Time 1000    SLP Stop Time  1100    SLP Time Calculation (min) 60 min    Activity Tolerance Patient tolerated treatment well             Past Medical History:  Diagnosis Date   Abnormal levels of other serum enzymes 07/17/2015   Arthritis    Constipation 07/11/2015   COVID-19 03/06/2021   Diabetes mellitus without complication ([place])    Elevated liver enzymes    Fatty liver    Generalized abdominal pain 07/11/2015   Hyperlipidemia    Hypertension    Lifelong obesity    Macular degeneration    Morbid obesity due to excess calories ([place]) 07/11/2015   Other fatigue 07/11/2015   Sleep apnea    Spinal headache    Past Surgical History:  Procedure Laterality Date   ABDOMINAL HYSTERECTOMY     APPENDECTOMY     CATARACT EXTRACTION     CERVICAL LAMINECTOMY  07/21/1985   CESAREAN SECTION     COLONOSCOPY  07/21/2012   diverticulosis, [facility] Dr. Donnella Sham    COLONOSCOPY WITH PROPOFOL N/A 02/04/2019   Procedure: COLONOSCOPY WITH PROPOFOL;  Surgeon: Lollie Sails, MD;  Location[facility] ENDOSCOPY;  Service: Endoscopy;  Laterality: N/A;   COLONOSCOPY  WITH PROPOFOL N/A 03/18/2021   Procedure: COLONOSCOPY WITH PROPOFOL;  Surgeon: Lesly Rubenstein, MD;  Location: [facility] ENDOSCOPY;  Service: Endoscopy;  Laterality: N/A;  DM   DIAGNOSTIC LAPAROSCOPY     ESOPHAGOGASTRODUODENOSCOPY (EGD) WITH PROPOFOL N/A 02/04/2019   Procedure: ESOPHAGOGASTRODUODENOSCOPY (EGD) WITH PROPOFOL;  Surgeon: Lollie Sails, MD;  Location: [facility] ENDOSCOPY;  Service: Endoscopy;  Laterality: N/A;   ESOPHAGOGASTRODUODENOSCOPY (EGD) WITH PROPOFOL N/A 03/18/2021   Procedure: ESOPHAGOGASTRODUODENOSCOPY (EGD) WITH PROPOFOL;  Surgeon: Lesly Rubenstein, MD;  Location: [facility] ENDOSCOPY;  Service: Endoscopy;  Laterality: N/A;   LOOP RECORDER INSERTION N/A 08/05/2021   Procedure: LOOP RECORDER INSERTION;  Surgeon: Evans Lance, MD;  Location: [place] CV LAB;  Service: Cardiovascular;  Laterality: N/A;   [place]     TUBAL LIGATION     Patient Active Problem List   Diagnosis Date Noted   Cerebral edema ([place]) 08/07/2021   OSA (obstructive sleep apnea) 08/07/2021   Right middle cerebral artery stroke ([place]) 08/07/2021   CVA (cerebral vascular accident) ([place]) 08/01/2021   GERD (gastroesophageal reflux disease) 07/20/2019   Advanced care planning/counseling discussion 12/17/2016   Skin lesions, generalized 11/13/2016   Chronic right hip pain 06/17/2016   Vitamin D deficiency 09/26/2015   Diastasis recti 08/08/2015   Elevated serum GGT level 07/24/2015   Essential hypertension 07/17/2015   Fatty liver 07/17/2015  Elevated alkaline phosphatase level 07/17/2015   Elevated serum glutamic pyruvic transaminase (SGPT) level 07/17/2015   Abnormal finding on EKG 07/11/2015   Morbid obesity ([place]) 07/11/2015   Colon, diverticulosis 07/11/2015   Abdominal wall hernia 07/11/2015   DM (diabetes mellitus), type 2 with neurological complications ([place])    Hyperlipidemia     ONSET DATE: 08/01/2021 CVA; 02/26/2022 date of referral   REFERRING DIAG: I69.319 (ICD-10-CM) -  Cognitive deficit, post-stroke I63.511 (ICD-10-CM) - Right middle cerebral artery stroke [facility])     PERTINENT HISTORY: [name] is a 76 y.o. female with history of diabetes, hypertension, hyperlipidemia, obesity, and sleep apnea who presented to [facility] ED on 08/01/2021 after being found down by her son. Her last know well was approximately 07/29/2021. Pt transferred to [facility] for further CVA management. In addition, pt received ST services in CIR from 08/08/2021 thru 08/21/2021.      DIAGNOSTIC FINDINGS: MRI showed extensive restricted diffusion throughout much of the right MCA territory with associated changes of hemorrhagic transformation and localized cerebral edema and mass effect. Per Dr. Leonie Man, felt embolic pattern secondary to unclear source although concerning for occult A fib. Repeat [facility] 1/14 showed no interval progression of right MCA hemorrhagic infarct with petechial hemorrhage.  CTA head/neck showed right M2 thrombus.  EF 60 to 65%.   THERAPY DIAG:  Cognitive communication deficit  Right middle cerebral artery stroke ([place])  Rationale for Evaluation and Treatment Rehabilitation  SUBJECTIVE: "I made myself soup several times"  Pt accompanied by: family member  PAIN:  Are you having pain? No  PATIENT GOALS to be able to drive again and live independently (she currently lives alone but her son plans on moving in with her)  OBJECTIVE:   TODAY'S TREATMENT: Skilled treatment session focused on pt's cognitive communication goals. SLP facilitated the session by providing the following interventions:  SLP provided instruction on creating design based on black line drawing. SLP provided pt with basic diamond shape. Pt completed accurately and within an appropriate amount of time.   Focused on safety awareness by viewing different safety picture cards; Pt was independent in identifying safe vs unsafe situations as well providing a safe solution to each situation    PATIENT  EDUCATION: Education details: cognitive organization; directional awareness for navigating the hallways Person educated: Patient and Child(ren) Education method: Explanation, Demonstration, Verbal cues, and Handouts Education comprehension: needs further education  GOALS: Goals reviewed with patient? Yes  REPORTING PERIOD 03/04/2022 to 05/13/2022 SHORT TERM GOALS: Target date: 10 sessions   With moderate assistance, pt will recall her medicines with 80% accuracy.  Baseline: currently unable Goal status: MET; 91% recall independently   2.  With minimal assistance, pt will complete basic medication management task with > 90% accuracy.  Baseline: total Goal status: ONGOING; currently requiring moderate assistance    3.  Pt will demonstrate selective attention to basic task for 15 minutes with < 2 redirections.  Baseline: moderate assistance Goal status: ONGOING; currently requiring minimal assistance   4.  Pt will complete basic bill paying task with 75% accuracy.  Baseline: total Goal status: ONGOING; currently at 50% with moderate assistance  5. With minimal assistance, pt will use strategies to complete semi-complex visuospatial tasks with 85% accuracy.   Baseline: moderate  Goal status: INITIAL  6. Pt will require < 2 cues to initiate leaving session in 5 out of 6 opportunities.   Baseline: 6 cues  Goal status: INITIAL     LONG TERM GOALS:  Target date: 05/27/2022 With rare min A, pt will organize her medicines accurately in a pill organizer.  Baseline: Total Goal status: ONGOING   2.  With rare min A, pt will demonstrate selective attention to task for 30 minutes.  Baseline: moderate Goal status: ONGOING   3.  With rare min A, pt will sequence basic meal prep activity.  Baseline: Moderate Goal status: ONGOING      ASSESSMENT:  CLINICAL IMPRESSION: While pt continues to present with moderate cognitive communication impairments, she presents with improved  ability to utilize compensatory problem solving strategies to achieve improved accuracy with medication management. Her acute deficits include increased distractibility, decreased task initiation, self-awareness of deficits and possible visual perceptual deficits as well as recall of directional navigation.    OBJECTIVE IMPAIRMENTS include attention and executive functioning. These impairments are limiting patient from managing medications, managing appointments, managing finances, household responsibilities, and ADLs/IADLs. Factors affecting potential to achieve goals and functional outcome are ability to learn/carryover information. Patient will benefit from skilled SLP services to address above impairments and improve overall function.  REHAB POTENTIAL: Good  PLAN: SLP FREQUENCY: 1-2x/week  SLP DURATION: 12 weeks  PLANNED INTERVENTIONS: Functional tasks, SLP instruction and feedback, and Patient/family education   [place] B. Rutherford Nail, M.S., CCC-SLP, Mining engineer Certified Brain Injury [place]  [place] Office [phone] Ascom [phone] Fax [phone]

## 2022-05-13 NOTE — Therapy (Signed)
OCCUPATIONAL THERAPYTREATMENT  Patient Name: Alyssa Mayo MRN: [id number] DOB:[birth year], 76 y.o., female Today's Date: 03/14/2022  PCP: [place] Pink, MD REFERRING PROVIDER: Frann Rider, NP   OT End of Session - 05/13/22 1102     Visit Number 12    Number of Visits 24    Date for OT Re-Evaluation 05/27/22    Authorization Type Progress reporting period starting 03/04/2022    OT Start Time 1100    OT Stop Time 1145    OT Time Calculation (min) 45 min    Activity Tolerance Patient tolerated treatment well    Behavior During Therapy [facility] for tasks assessed/performed             Past Medical History:  Diagnosis Date   Abnormal levels of other serum enzymes 07/17/2015   Arthritis    Constipation 07/11/2015   COVID-19 03/06/2021   Diabetes mellitus without complication ([place])    Elevated liver enzymes    Fatty liver    Generalized abdominal pain 07/11/2015   Hyperlipidemia    Hypertension    Lifelong obesity    Macular degeneration    Morbid obesity due to excess calories ([place]) 07/11/2015   Other fatigue 07/11/2015   Sleep apnea    Spinal headache    Past Surgical History:  Procedure Laterality Date   ABDOMINAL HYSTERECTOMY     APPENDECTOMY     CATARACT EXTRACTION     CERVICAL LAMINECTOMY  07/21/1985   CESAREAN SECTION     COLONOSCOPY  07/21/2012   diverticulosis, [facility] Dr. Donnella Sham    COLONOSCOPY WITH PROPOFOL N/A 02/04/2019   Procedure: COLONOSCOPY WITH PROPOFOL;  Surgeon: Lollie Sails, MD;  Location: [facility] ENDOSCOPY;  Service: Endoscopy;  Laterality: N/A;   COLONOSCOPY WITH PROPOFOL N/A 03/18/2021   Procedure: COLONOSCOPY WITH PROPOFOL;  Surgeon: Lesly Rubenstein, MD;  Location: [facility] ENDOSCOPY;  Service: Endoscopy;  Laterality: N/A;  DM   DIAGNOSTIC LAPAROSCOPY     ESOPHAGOGASTRODUODENOSCOPY (EGD) WITH PROPOFOL N/A 02/04/2019   Procedure: ESOPHAGOGASTRODUODENOSCOPY (EGD) WITH PROPOFOL;  Surgeon: Lollie Sails, MD;  Location: [facility] ENDOSCOPY;   Service: Endoscopy;  Laterality: N/A;   ESOPHAGOGASTRODUODENOSCOPY (EGD) WITH PROPOFOL N/A 03/18/2021   Procedure: ESOPHAGOGASTRODUODENOSCOPY (EGD) WITH PROPOFOL;  Surgeon: Lesly Rubenstein, MD;  Location: [facility] ENDOSCOPY;  Service: Endoscopy;  Laterality: N/A;   LOOP RECORDER INSERTION N/A 08/05/2021   Procedure: LOOP RECORDER INSERTION;  Surgeon: Evans Lance, MD;  Location: [place] CV LAB;  Service: Cardiovascular;  Laterality: N/A;   SPINE SURGERY     TUBAL LIGATION     Patient Active Problem List   Diagnosis Date Noted   Cerebral edema ([place]) 08/07/2021   OSA (obstructive sleep apnea) 08/07/2021   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   CVA (cerebral vascular accident) ([place]) 08/01/2021   GERD (gastroesophageal reflux disease) 07/20/2019   Advanced care planning/counseling discussion 12/17/2016   Skin lesions, generalized 11/13/2016   Chronic right hip pain 06/17/2016   Vitamin D deficiency 09/26/2015   Diastasis recti 08/08/2015   Elevated serum GGT level 07/24/2015   Essential hypertension 07/17/2015   Fatty liver 07/17/2015   Elevated alkaline phosphatase level 07/17/2015   Elevated serum glutamic pyruvic transaminase (SGPT) level 07/17/2015   Abnormal finding on EKG 07/11/2015   Morbid obesity ([place]) 07/11/2015   Colon, diverticulosis 07/11/2015   Abdominal wall hernia 07/11/2015   DM (diabetes mellitus), type 2 with neurological complications ([place])    Hyperlipidemia     ONSET DATE: 08/01/2021  REFERRING  DIAG: CVA  THERAPY DIAG:  Muscle weakness (generalized)  Rationale for Evaluation and Treatment Rehabilitation  SUBJECTIVE:   SUBJECTIVE STATEMENT:   Pt accompanied by: self  PERTINENT HISTORY:  Pt. Is a 76 y.o. female  presents with a diagnosis of RMCA CVA infarction with hemorrhagic transformation. Pt. Attended inpatient rehab from 08/07/2021-08/22/2021. Pt. received Home health therapy services this past spring.  Pt. Has recently had an  assessment through driver rehabilitation services at [facility] with recommendations for referrals for  outpatient OT/PT, and ST services. Pt. PMHx includes: HTN, Hyperlipidemia, Macular degeneration, DM, and Obesity.  PRECAUTIONS: None  WEIGHT BEARING RESTRICTIONS No  PAIN:  Are you having pain? No. Pt. reports muscle soreness in the bilateral forearms, and biceps. Pt. Reports that she woke up at 4:15 a.m. this morning, and it feels like she worked her arms yesterday.    FALLS: Has patient fallen in last 6 months? Yes.  LIVING ENVIRONMENT: Lives with: lives with their family  Son  Alyssa Mayo Lives in: House/apartment Main living are on one floor Stairs:  2 steps to enter Has following equipment at home: Single point cane, Wheelchair (manual), shower chair, Grab bars, bed rail, and rubber mat.  PLOF: Independent  PATIENT GOALS: To be able to Drive, and cook again  OBJECTIVE:   HAND DOMINANCE: Right   ADLs: Overall ADLs:  Transfers/ambulation related to ADLs: Independent Eating: Independent Grooming: Pt. Fatigues with sustained BUE's in elevation for haircare. Especially with blow drying hair. UB Dressing: Independent pullover shirt, independent now with fastening a bra LB Dressing: Independent donning pants socks, and slide on shoes Toileting: Independent Bathing: Independent Tub Shower transfers: Walk-in shower Independent now   IADLs: Shopping: Needs to be accompanied to the grocery store. Light housekeeping: Does own laundry, laundry,  Meal Prep:  Difficulty with cooking Community mobility: Relies on son, and friend Medication management: Son sets up Building surveyor management: Manages monthly bills independently. Handwriting: No change from baseline  MOBILITY STATUS: Hx of falls out of bed   ACTIVITY TOLERANCE: Activity tolerance:  10-20 min. Before rest break  FUNCTIONAL OUTCOME MEASURES: FOTO: 57  TR score: 62  UPPER EXTREMITY ROM     Active ROM Right eval  Left eval Left 05/06/2022  Shoulder flexion WFL 96(110) 107(110)  Shoulder abduction WFL 82(105) 86(105)  Shoulder adduction     Shoulder extension     Shoulder internal rotation     Shoulder external rotation     Elbow flexion [facility] WFL  Elbow extension [facility] WFL  Wrist flexion WFL 44(60) 68(68)  Wrist extension WFL 32(60 66(66)  Wrist ulnar deviation     Wrist radial deviation     Wrist pronation     Wrist supination     (Blank rows = not tested)   UPPER EXTREMITY MMT:     MMT Right eval Left eval Left 05/06/2022  Shoulder flexion 4+/5 3-/5 3+/5  Shoulder abduction 4+/5 3-/5 3-/5  Shoulder adduction     Shoulder extension     Shoulder internal rotation     Shoulder external rotation     Middle trapezius     Lower trapezius     Elbow flexion 4+/5 4-/5 4+/5  Elbow extension 4+/5 4-/5 4+/5  Wrist flexion 4+/5 3-/5 4/5  Wrist extension 4+/5 3/5 4/5  Wrist ulnar deviation     Wrist radial deviation     Wrist pronation     Wrist supination     (Blank rows = not tested)  HAND FUNCTION: Grip strength: Right: 30 lbs; Left: 13 lbs, Lateral pinch: Right: 15 lbs, Left: 8 lbs, and 3 point pinch: Right: 13 lbs, Left: 7 lbs  05/06/2022 Grip strength: Right: 30 lbs; Left: 14 lbs, Lateral pinch: Right: 15 lbs, Left: 10 lbs, and 3 point pinch: Right: 13 lbs, Left: 7 lbs   COORDINATION: 9 Hole Peg test: Right: 26  sec; Left: 1 min. & 9 sec  05/06/2022 9 Hole Peg test: Right: 26  sec; Left: 1 min.  SENSATION: Light touch: WFL Proprioception: WFL  COGNITION: Overall cognitive status: Within functional limits for tasks assessed  VISION: Subjective report: Glasses. Just received a new prescription to increase bifocal strength Baseline vision: Macular Degeneration Visual history: macular degeneration  VISION ASSESSMENT: To be further assessed in functional context  Left sided Awareness    PERCEPTION: Limited left sided awareness   TODAY'S TREATMENT:        Neuromuscular re-education:    Pt. worked on [facility] skills grasping 1" sticks, 1/4" collars, and 1/4" washers. Pt. worked on storing the objects in the palm of her hand, and translatory skills moving the items from the palm of the hand to the tip of the 2nd digit, and thumb. Pt. worked on removing the pegs using bilateral alternating hand patterns.   Pt. reports bilateral bicep, and forearm soreness that started around 4:15 a.m. this morning. Pt. reports that it feels like she worked her arms yesterday. Pt. reports that she didn't do anything yesterday after getting an injection in her left eye. Pt. reports that she didn't sleep well last night.  Pt. presented with fatigue this morning. Pt. required increased time to complete the task. Pt. was able to store each of the small items. Pt. was able to perform translatory movements with the sticks, and collars. Pt. dropped multiple 1/4" flat washers from her finger tips. Pt. was unable to store the flat washers while performing the translatory movements. Pt. moved one at a time. Pt. Continues to improve left hand function in order to improve, and maximize independence with ADLs, and IADL tasks.     PATIENT EDUCATION: Education details: Kitchen safety/use of external memory aids to turn off burners, limit distractions when in kitchen Person educated: Patient Education method: verbal cues Education comprehension: verbalized understanding and pt does not plan to cook unless son is present.   HOME EXERCISE PROGRAM:  Continue with an ongoing assessment of HEP needs    GOALS: Goals reviewed with patient? Yes   SHORT TERM GOALS: Target date:  04/15/2022      Pt. Will improve FOTO score by 2 points to reflect improved pt. perceived functional performance Baseline: FOTO: 57, TR score: 62 10th visit: FOTO: 65, TR score: 62 Goal status: Achieved  LONG TERM GOALS: Target date: 05/27/2022    Pt. Will improve left UE strength by 2 mm grades to assist  with repositioning her grandson on her lap. Baseline: Eval: left shoulder flexion: 3-/5, abduction 3-/5,  elbow flex/ext. 4-/5, wrist extension 3/5, wrist flexion 3-/5  10th visit: left shoulder flexion: 3+/5, abduction 3-+5,  elbow flex/ext. 4+/5, wrist extension 4/5, wrist flexion 34/5  Goal status: Ongoing  2.  Pt. will improve left grip strength by 5# to be able to independently hold and use a blow dryer, and brush for hair care. Baseline: Eval: Left grip strength 13#. Pt. Has difficulty performing hair care, holding and using a brush, and blow dryer. 10th visit: Left grip 14#. Pt. Is now able to hold  th e blow dryer for the sides, and top of head, not the back. Goal status: Ongoing  3.  Pt. Will improve left lateral pinch strength by 3# to be able to independently cut meat. Baseline: Eval: Left 8, Pt. Has difficulty cutting meat. 10th: Left: 10#.  Pt. Is able to cut tender meat, however is not as efficient Goal status: Ongoing  4.  Pt. Will improve Left hand [facility] skills to be able to be able to independently, and efficiently manipulate small objects during ADL tasks. Baseline: Eval: left: 1 min. 9 sec. 10th visit: Left: 1 min. Pt. Is improving with manipulating small objects.  Goal status: Ongoing   5.  Pt. Will perform light meal preparation with modified independence Baseline: Eval: Pt. Reports having difficulty performing light  meal preparation, and cooking tasks. 10th visit: Pt. Is able to make toast in the morning, a sandwich,  use the microwave, make coffee, and pour herself a drink.  Goal status: Ongoing.   ASSESSMENT:  CLINICAL IMPRESSION:  Pt. reports bilateral bicep, and forearm soreness that started around 4:15 a.m. this morning. Pt. reports that it feels like she worked her arms yesterday. Pt. reports that she didn't do anything yesterday after getting an injection in her left eye. Pt. reports that she didn't sleep well last night.  Pt. presented with fatigue this morning.  Pt. required increased time to complete the task. Pt. was able to store each of the small items. Pt. was able to perform translatory movements with the sticks, and collars. Pt. dropped multiple 1/4" flat washers from her finger tips. Pt. was unable to store the flat washers while performing the translatory movements. Pt. moved one at a time. Pt. Continues to improve left hand function in order to improve, and maximize independence with ADLs, and IADL tasks.    PERFORMANCE DEFICITS in functional skills including ADLs, IADLs, coordination, dexterity, sensation, ROM, strength, FMC, decreased knowledge of use of DME, vision, and UE functional use, cognitive skills including attention, and psychosocial skills including coping strategies, environmental adaptation, habits, and routines and behaviors.   IMPAIRMENTS are limiting patient from ADLs, IADLs, and social participation.   COMORBIDITIES may have co-morbidities  that affects occupational performance. Patient will benefit from skilled OT to address above impairments and improve overall function.  MODIFICATION OR ASSISTANCE TO COMPLETE EVALUATION: Min-Moderate modification of tasks or assist with assess necessary to complete an evaluation.  OT OCCUPATIONAL PROFILE AND HISTORY: Detailed assessment: Review of records and additional review of physical, cognitive, psychosocial history related to current functional performance.  CLINICAL DECISION MAKING: Moderate - several treatment options, min-mod task modification necessary  REHAB POTENTIAL: Good  EVALUATION COMPLEXITY: Moderate    PLAN: OT FREQUENCY: 2x/week  OT DURATION: 12 weeks  PLANNED INTERVENTIONS: self care/ADL training, therapeutic exercise, therapeutic activity, neuromuscular re-education, manual therapy, passive range of motion, and paraffin  RECOMMENDED OTHER SERVICES: ST, and PT  CONSULTED AND AGREED WITH PLAN OF CARE: Patient  PLAN FOR NEXT SESSION:  L hand strengthening and  coordination exercises  Harrel Carina, MS, OTR/L   Harrel Carina, MS, OTR/L

## 2022-05-14 ENCOUNTER — Encounter: Payer: Self-pay | Admitting: Occupational Therapy

## 2022-05-14 ENCOUNTER — Ambulatory Visit: Payer: Medicare HMO | Admitting: Occupational Therapy

## 2022-05-14 ENCOUNTER — Ambulatory Visit: Payer: Medicare HMO

## 2022-05-14 DIAGNOSIS — M6281 Muscle weakness (generalized): Secondary | ICD-10-CM

## 2022-05-14 DIAGNOSIS — R2681 Unsteadiness on feet: Secondary | ICD-10-CM

## 2022-05-14 DIAGNOSIS — R41841 Cognitive communication deficit: Secondary | ICD-10-CM | POA: Diagnosis not present

## 2022-05-14 DIAGNOSIS — R262 Difficulty in walking, not elsewhere classified: Secondary | ICD-10-CM

## 2022-05-14 NOTE — Therapy (Signed)
OUTPATIENT PHYSICAL THERAPY NEURO TREATMENT   Patient Name: Alyssa Mayo MRN: [id number] DOB:Dec 26, 1945, 76 y.o., female Today's Date: 05/14/2022   PCP: [place] Pink, MD REFERRING PROVIDER: Frann Rider, NP   PT End of Session - 05/14/22 1212     Visit Number 9    Number of Visits 25    Date for PT Re-Evaluation 06/25/22    Authorization Type [employer]    Authorization Time Period 04/02/2022-06/25/2022    PT Start Time 1146    PT Stop Time 1228    PT Time Calculation (min) 42 min    Equipment Utilized During Treatment Gait belt    Activity Tolerance Patient tolerated treatment well    Behavior During Therapy [facility] for tasks assessed/performed               Past Medical History:  Diagnosis Date   Abnormal levels of other serum enzymes 07/17/2015   Arthritis    Constipation 07/11/2015   COVID-19 03/06/2021   Diabetes mellitus without complication ([place])    Elevated liver enzymes    Fatty liver    Generalized abdominal pain 07/11/2015   Hyperlipidemia    Hypertension    Lifelong obesity    Macular degeneration    Morbid obesity due to excess calories ([place]) 07/11/2015   Other fatigue 07/11/2015   Sleep apnea    Spinal headache    Past Surgical History:  Procedure Laterality Date   ABDOMINAL HYSTERECTOMY     APPENDECTOMY     CATARACT EXTRACTION     CERVICAL LAMINECTOMY  07/21/1985   CESAREAN SECTION     COLONOSCOPY  07/21/2012   diverticulosis, [facility] Dr. Donnella Sham    COLONOSCOPY WITH PROPOFOL N/A 02/04/2019   Procedure: COLONOSCOPY WITH PROPOFOL;  Surgeon: Lollie Sails, MD;  Location: [facility] ENDOSCOPY;  Service: Endoscopy;  Laterality: N/A;   COLONOSCOPY WITH PROPOFOL N/A 03/18/2021   Procedure: COLONOSCOPY WITH PROPOFOL;  Surgeon: Lesly Rubenstein, MD;  Location: [facility] ENDOSCOPY;  Service: Endoscopy;  Laterality: N/A;  DM   DIAGNOSTIC LAPAROSCOPY     ESOPHAGOGASTRODUODENOSCOPY (EGD) WITH PROPOFOL N/A 02/04/2019   Procedure:  ESOPHAGOGASTRODUODENOSCOPY (EGD) WITH PROPOFOL;  Surgeon: Lollie Sails, MD;  Location[facility] ENDOSCOPY;  Service: Endoscopy;  Laterality: N/A;   ESOPHAGOGASTRODUODENOSCOPY (EGD) WITH PROPOFOL N/A 03/18/2021   Procedure: ESOPHAGOGASTRODUODENOSCOPY (EGD) WITH PROPOFOL;  Surgeon: Lesly Rubenstein, MD;  Location: [facility] ENDOSCOPY;  Service: Endoscopy;  Laterality: N/A;   LOOP RECORDER INSERTION N/A 08/05/2021   Procedure: LOOP RECORDER INSERTION;  Surgeon: Evans Lance, MD;  Location: [facility] CV LAB;  Service: Cardiovascular;  Laterality: N/A;   [place]     TUBAL LIGATION     Patient Active Problem List   Diagnosis Date Noted   Cerebral edema ([place]) 08/07/2021   OSA (obstructive sleep apnea) 08/07/2021   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   CVA (cerebral vascular accident) ([place]) 08/01/2021   GERD (gastroesophageal reflux disease) 07/20/2019   Advanced care planning/counseling discussion 12/17/2016   Skin lesions, generalized 11/13/2016   Chronic right hip pain 06/17/2016   Vitamin D deficiency 09/26/2015   Diastasis recti 08/08/2015   Elevated serum GGT level 07/24/2015   Essential hypertension 07/17/2015   Fatty liver 07/17/2015   Elevated alkaline phosphatase level 07/17/2015   Elevated serum glutamic pyruvic transaminase (SGPT) level 07/17/2015   Abnormal finding on EKG 07/11/2015   Morbid obesity ([place]) 07/11/2015   Colon, diverticulosis 07/11/2015   Abdominal wall hernia 07/11/2015   DM (diabetes  mellitus), type 2 with neurological complications [facility])    Hyperlipidemia     ONSET DATE: January 11th 2023  REFERRING DIAG:  I69.398,R26.9 (ICD-10-CM) - Gait disturbance, post-stroke  I63.511 (ICD-10-CM) - Right middle cerebral artery stroke (H[facility])    THERAPY DIAG:  Muscle weakness (generalized)  Difficulty in walking, not elsewhere classified  Unsteadiness on feet  Rationale for Evaluation and Treatment Rehabilitation  SUBJECTIVE:                                                                                                                                                                                               SUBJECTIVE STATEMENT: Pt reports some soreness in BUEs. She reports she woke up with BUEs hurting since yesterday. She is not sure what caused the pain. She reports BUEs usually hurt just near shoulder but now she feels it all the way down her arm. Reports no stumbles/falls. She reports no other changes or current concerns. Pt is doing HEP "a couple times a day."  She rates her pain 3/10, and describes as sore muscles.    Pt accompanied by: self  PERTINENT HISTORY: Per chart and confirmed by pt:  Pt is a 76 yo female with RMCA CVA infarction with hemorrhagic transformation 08/01/2021, with inpatient rehab 08/07/2021-08/22/2021 followed by [facility] therapy until June. Pt now currently being seen for outpatient OT and ST services. Pt reports limitations in stamina persist. Other PMH is significant for HTN, HLD, macular degeneration, DM, obesity, sleep apnea, arthritis, chronic R hip pain, vitamin D deficiency, diastasis recti, abdominal wall hernia, hx of abdominal hysterectomy, appendectomy, spine surgery (years ago).   PAIN:  Are you having pain? Yes, 2/10 Pain Location: R Hip   TODAY'S TREATMENT:  TherEx:  Several minutes of the following: In // bars -Side-stepping for hip abd. strengthening/endurance without weight  Pt then progressed to performing intervention with 2.5# ankle weights -Standing Marching FWD/ hip ext kick with BCKWD gait with 2.5# weights  2.5# weights donned - 2 rounds of the following Seated march 15x each LE LAQ 15x each LE  Amb for endurance with 2.5# AW donned each LE 2x148 ft followed by rest break, then completed another 2x148 ft. Requests prolonged rest break due to fatigue.  Standing heel raises 15x B  - progressed to single leg heel raise 10x each LE  STS 10x hands-free  Nustep (LE  only) interval training lvl 1-3 (level 3 performed briefly x 30 sec) total 4 min, difficulty increasing SPM  >45.   Pt requests rest breaks throughout due to fatigue.  PATIENT EDUCATION: Education details: exam findings, indications, plan, HEP Person  educated: Patient Education method: Consulting civil engineer, Demonstration, Verbal cues, and Handouts Education comprehension: verbalized understanding, returned demonstration, verbal cues required, and needs further education   HOME EXERCISE PROGRAM: 05/01/22: Pt and son educated to perform NBOS cross body reaching at home, at [employer]  Access Code: [email or web address] URL: [email or web address] Date: 04/02/2022 Prepared by: Ricard Dillon  Exercises - Seated March  - 1 x daily - 5 x weekly - 3 sets - 15 reps - Standing Single Leg Stance with Counter Support  - 1 x daily - 7 x weekly - 2 sets - 2 reps - 30 seconds hold    GOALS: Goals reviewed with patient? Yes  SHORT TERM GOALS: Target date: 06/25/2022  Patient will be independent in home exercise program to improve strength/mobility for better functional independence with ADLs. Baseline: initiated Goal status: INITIAL   LONG TERM GOALS: Target date: 08/06/2022  Patient will increase FOTO score to equal to or greater than 65  to demonstrate improvement in mobility and quality of life.  Baseline: 57 Goal status: INITIAL  2.  Patient (> 71 years old) will complete five times sit to stand test in < 15 seconds indicating an increased LE strength and improved balance. Baseline: 17 sec hands-free Goal status: INITIAL  3.  Patient will increase 10 meter walk test to >1.3m/s as to improve gait speed for better community ambulation and to reduce fall risk. Baseline: 0.66 m/s Goal status: INITIAL  4.  Patient will increase Berg Balance score by > 3 points to demonstrate decreased fall risk during functional activities. Baseline: 50/56 Goal status: INITIAL  5.  Patient will increase  six minute walk test distance to >1000 for progression to community ambulator and improve gait ability Baseline: 04/10/22: 637ft (pain onset at 5 minutes)  Goal status: INITIAL    ASSESSMENT:  CLINICAL IMPRESSION: Continued with strengthening and endurance interventions. Pt able to perform some of the interventions from previous session with increased resistance, rating exercises no greater than medium difficulty. Pt still with significant endurance deficit, requiring frequent rest breaks throughout.   Pt will continue to benefit from skilled therapy to address remaining deficits in order to improve overall QoL and return to PLOF.      OBJECTIVE IMPAIRMENTS Abnormal gait, decreased activity tolerance, decreased balance, decreased coordination, decreased endurance, decreased mobility, difficulty walking, decreased strength, impaired sensation, impaired UE functional use, improper body mechanics, postural dysfunction, and pain.   ACTIVITY LIMITATIONS carrying, lifting, bending, standing, squatting, stairs, bed mobility, bathing, reach over head, hygiene/grooming, and locomotion level  PARTICIPATION LIMITATIONS: meal prep, cleaning, laundry, medication management, personal finances, driving, shopping, community activity, and yard work  PERSONAL FACTORS Age, Sex, Time since onset of injury/illness/exacerbation, and 3+ comorbidities: Other PMH is significant for HTN, HLD, macular degeneration, DM, obesity, sleep apnea, arthritis, chronic R hip pain, vitamin D deficiency, diastasis recti, abdominal wall hernia, hx of abdominal hysterectomy, appendectomy, spine surgery (years ago).   are also affecting patient's functional outcome.   REHAB POTENTIAL: Good  CLINICAL DECISION MAKING: Evolving/moderate complexity  EVALUATION COMPLEXITY: Moderate  PLAN: PT FREQUENCY: 2x/week  PT DURATION: 12 weeks  PLANNED INTERVENTIONS: Therapeutic exercises, Therapeutic activity, Neuromuscular re-education,  Balance training, Gait training, Patient/Family education, Self Care, Joint mobilization, Joint manipulation, Stair training, Vestibular training, Canalith repositioning, Orthotic/Fit training, DME instructions, Dry Needling, Electrical stimulation, Wheelchair mobility training, Spinal mobilization, Cryotherapy, Moist heat, Splintting, Taping, Traction, Ultrasound, Biofeedback, Manual therapy, and Re-evaluation  PLAN FOR NEXT SESSION: multidirectional reaching coupled with cognitive/balance, strength, balance, gait  Hildred Alamin  Lacretia Nicks PT, DPT  05/14/22, 2:00 PM Physical Therapist - [facility]

## 2022-05-14 NOTE — Therapy (Addendum)
OCCUPATIONAL THERAPYTREATMENT  Patient Name: Alyssa Mayo MRN: [id number] DOB:15-Aug-1945, 76 y.o., female Today's Date: 03/14/2022  PCP: [place] Pink, MD REFERRING PROVIDER: Frann Rider, NP   OT End of Session - 05/14/22 1340     Visit Number 13    Number of Visits 24    Date for OT Re-Evaluation 05/27/22    Authorization Type Progress reporting period starting 03/04/2022    OT Start Time 1300    OT Stop Time 1345    OT Time Calculation (min) 45 min    Activity Tolerance Patient tolerated treatment well    Behavior During Therapy [facility] for tasks assessed/performed             Past Medical History:  Diagnosis Date   Abnormal levels of other serum enzymes 07/17/2015   Arthritis    Constipation 07/11/2015   COVID-19 03/06/2021   Diabetes mellitus without complication ([place])    Elevated liver enzymes    Fatty liver    Generalized abdominal pain 07/11/2015   Hyperlipidemia    Hypertension    Lifelong obesity    Macular degeneration    Morbid obesity due to excess calories ([place]) 07/11/2015   Other fatigue 07/11/2015   Sleep apnea    Spinal headache    Past Surgical History:  Procedure Laterality Date   ABDOMINAL HYSTERECTOMY     APPENDECTOMY     CATARACT EXTRACTION     CERVICAL LAMINECTOMY  07/21/1985   CESAREAN SECTION     COLONOSCOPY  07/21/2012   diverticulosis, [facility] Dr. Donnella Sham    COLONOSCOPY WITH PROPOFOL N/A 02/04/2019   Procedure: COLONOSCOPY WITH PROPOFOL;  Surgeon: Lollie Sails, MD;  Location: [facility] ENDOSCOPY;  Service: Endoscopy;  Laterality: N/A;   COLONOSCOPY WITH PROPOFOL N/A 03/18/2021   Procedure: COLONOSCOPY WITH PROPOFOL;  Surgeon: Lesly Rubenstein, MD;  Location: [facility] ENDOSCOPY;  Service: Endoscopy;  Laterality: N/A;  DM   DIAGNOSTIC LAPAROSCOPY     ESOPHAGOGASTRODUODENOSCOPY (EGD) WITH PROPOFOL N/A 02/04/2019   Procedure: ESOPHAGOGASTRODUODENOSCOPY (EGD) WITH PROPOFOL;  Surgeon: Lollie Sails, MD;  Location: [facility] ENDOSCOPY;   Service: Endoscopy;  Laterality: N/A;   ESOPHAGOGASTRODUODENOSCOPY (EGD) WITH PROPOFOL N/A 03/18/2021   Procedure: ESOPHAGOGASTRODUODENOSCOPY (EGD) WITH PROPOFOL;  Surgeon: Lesly Rubenstein, MD;  Location: [facility] ENDOSCOPY;  Service: Endoscopy;  Laterality: N/A;   LOOP RECORDER INSERTION N/A 08/05/2021   Procedure: LOOP RECORDER INSERTION;  Surgeon: Evans Lance, MD;  Location: [place] CV LAB;  Service: Cardiovascular;  Laterality: N/A;   SPINE SURGERY     TUBAL LIGATION     Patient Active Problem List   Diagnosis Date Noted   Cerebral edema ([place]) 08/07/2021   OSA (obstructive sleep apnea) 08/07/2021   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   CVA (cerebral vascular accident) ([place]) 08/01/2021   GERD (gastroesophageal reflux disease) 07/20/2019   Advanced care planning/counseling discussion 12/17/2016   Skin lesions, generalized 11/13/2016   Chronic right hip pain 06/17/2016   Vitamin D deficiency 09/26/2015   Diastasis recti 08/08/2015   Elevated serum GGT level 07/24/2015   Essential hypertension 07/17/2015   Fatty liver 07/17/2015   Elevated alkaline phosphatase level 07/17/2015   Elevated serum glutamic pyruvic transaminase (SGPT) level 07/17/2015   Abnormal finding on EKG 07/11/2015   Morbid obesity ([place]) 07/11/2015   Colon, diverticulosis 07/11/2015   Abdominal wall hernia 07/11/2015   DM (diabetes mellitus), type 2 with neurological complications ([place])    Hyperlipidemia     ONSET DATE: 08/01/2021  REFERRING  DIAG: CVA  THERAPY DIAG:  Muscle weakness (generalized)  Rationale for Evaluation and Treatment Rehabilitation  SUBJECTIVE:   SUBJECTIVE STATEMENT:   Pt accompanied by: self  PERTINENT HISTORY:  Pt. Is a 76 y.o. female  presents with a diagnosis of RMCA CVA infarction with hemorrhagic transformation. Pt. Attended inpatient rehab from 08/07/2021-08/22/2021. Pt. received Home health therapy services this past spring.  Pt. Has recently had an  assessment through driver rehabilitation services at [facility] with recommendations for referrals for  outpatient OT/PT, and ST services. Pt. PMHx includes: HTN, Hyperlipidemia, Macular degeneration, DM, and Obesity.  PRECAUTIONS: None  WEIGHT BEARING RESTRICTIONS No  PAIN:  Are you having pain? No. Pt. reports muscle soreness in the bilateral forearms, and biceps. Pt. Reports that she woke up at 4:15 a.m. this morning, and it feels like she worked her arms yesterday.    FALLS: Has patient fallen in last 6 months? Yes.  LIVING ENVIRONMENT: Lives with: lives with their family  Son  Marya Mayo Lives in: House/apartment Main living are on one floor Stairs:  2 steps to enter Has following equipment at home: Single point cane, Wheelchair (manual), shower chair, Grab bars, bed rail, and rubber mat.  PLOF: Independent  PATIENT GOALS: To be able to Drive, and cook again  OBJECTIVE:   HAND DOMINANCE: Right   ADLs: Overall ADLs:  Transfers/ambulation related to ADLs: Independent Eating: Independent Grooming: Pt. Fatigues with sustained BUE's in elevation for haircare. Especially with blow drying hair. UB Dressing: Independent pullover shirt, independent now with fastening a bra LB Dressing: Independent donning pants socks, and slide on shoes Toileting: Independent Bathing: Independent Tub Shower transfers: Walk-in shower Independent now   IADLs: Shopping: Needs to be accompanied to the grocery store. Light housekeeping: Does own laundry, laundry,  Meal Prep:  Difficulty with cooking Community mobility: Relies on son, and friend Medication management: Son sets up Building surveyor management: Manages monthly bills independently. Handwriting: No change from baseline  MOBILITY STATUS: Hx of falls out of bed   ACTIVITY TOLERANCE: Activity tolerance:  10-20 min. Before rest break  FUNCTIONAL OUTCOME MEASURES: FOTO: 57  TR score: 62  UPPER EXTREMITY ROM     Active ROM Right eval  Left eval Left 05/06/2022  Shoulder flexion WFL 96(110) 107(110)  Shoulder abduction WFL 82(105) 86(105)  Shoulder adduction     Shoulder extension     Shoulder internal rotation     Shoulder external rotation     Elbow flexion [facility] WFL  Elbow extension [facility] WFL  Wrist flexion WFL 44(60) 68(68)  Wrist extension WFL 32(60 66(66)  Wrist ulnar deviation     Wrist radial deviation     Wrist pronation     Wrist supination     (Blank rows = not tested)   UPPER EXTREMITY MMT:     MMT Right eval Left eval Left 05/06/2022  Shoulder flexion 4+/5 3-/5 3+/5  Shoulder abduction 4+/5 3-/5 3-/5  Shoulder adduction     Shoulder extension     Shoulder internal rotation     Shoulder external rotation     Middle trapezius     Lower trapezius     Elbow flexion 4+/5 4-/5 4+/5  Elbow extension 4+/5 4-/5 4+/5  Wrist flexion 4+/5 3-/5 4/5  Wrist extension 4+/5 3/5 4/5  Wrist ulnar deviation     Wrist radial deviation     Wrist pronation     Wrist supination     (Blank rows = not tested)  HAND FUNCTION: Grip strength: Right: 30 lbs; Left: 13 lbs, Lateral pinch: Right: 15 lbs, Left: 8 lbs, and 3 point pinch: Right: 13 lbs, Left: 7 lbs  05/06/2022 Grip strength: Right: 30 lbs; Left: 14 lbs, Lateral pinch: Right: 15 lbs, Left: 10 lbs, and 3 point pinch: Right: 13 lbs, Left: 7 lbs   COORDINATION: 9 Hole Peg test: Right: 26  sec; Left: 1 min. & 9 sec  05/06/2022 9 Hole Peg test: Right: 26  sec; Left: 1 min.  SENSATION: Light touch: WFL Proprioception: WFL  COGNITION: Overall cognitive status: Within functional limits for tasks assessed  VISION: Subjective report: Glasses. Just received a new prescription to increase bifocal strength Baseline vision: Macular Degeneration Visual history: macular degeneration  VISION ASSESSMENT: To be further assessed in functional context  Left sided Awareness    PERCEPTION: Limited left sided awareness   TODAY'S TREATMENT:        Neuromuscular re-education:    Pt. worked on [facility] skills grasping 1" sticks, 1/4" collars, and 1/4" washers. Pt. worked on storing the objects in the palm of her hand, and translatory skills moving the items from the palm of the hand to the tip of the 2nd digit, and thumb. Pt. worked on removing the pegs using bilateral alternating hand patterns.   Pt. reports bilateral bicep, and forearm soreness that started around 4:15 a.m. this morning. Pt. reports that it feels like she worked her arms yesterday. Pt. reports that she didn't do anything yesterday after getting an injection in her left eye. Pt. reports that she didn't sleep well last night.  Pt. presented with fatigue this morning. Pt. required increased time to complete the task. Pt. was able to store each of the small items. Pt. was able to perform translatory movements with the sticks, and collars. Pt. dropped multiple 1/4" flat washers from her finger tips. Pt. was unable to store the flat washers while performing the translatory movements. Pt. moved one at a time. Pt. Continues to improve left hand function in order to improve, and maximize independence with ADLs, and IADL tasks.     PATIENT EDUCATION: Education details: Kitchen safety/use of external memory aids to turn off burners, limit distractions when in kitchen Person educated: Patient Education method: verbal cues Education comprehension: verbalized understanding and pt does not plan to cook unless son is present.   HOME EXERCISE PROGRAM:  Continue with an ongoing assessment of HEP needs    GOALS: Goals reviewed with patient? Yes   SHORT TERM GOALS: Target date:  04/15/2022      Pt. Will improve FOTO score by 2 points to reflect improved pt. perceived functional performance Baseline: FOTO: 57, TR score: 62 10th visit: FOTO: 65, TR score: 62 Goal status: Achieved  LONG TERM GOALS: Target date: 05/27/2022    Pt. Will improve left UE strength by 2 mm grades to assist  with repositioning her grandson on her lap. Baseline: Eval: left shoulder flexion: 3-/5, abduction 3-/5,  elbow flex/ext. 4-/5, wrist extension 3/5, wrist flexion 3-/5  10th visit: left shoulder flexion: 3+/5, abduction 3-+5,  elbow flex/ext. 4+/5, wrist extension 4/5, wrist flexion 34/5  Goal status: Ongoing  2.  Pt. will improve left grip strength by 5# to be able to independently hold and use a blow dryer, and brush for hair care. Baseline: Eval: Left grip strength 13#. Pt. Has difficulty performing hair care, holding and using a brush, and blow dryer. 10th visit: Left grip 14#. Pt. Is now able to hold  th e blow dryer for the sides, and top of head, not the back. Goal status: Ongoing  3.  Pt. Will improve left lateral pinch strength by 3# to be able to independently cut meat. Baseline: Eval: Left 8, Pt. Has difficulty cutting meat. 10th: Left: 10#.  Pt. Is able to cut tender meat, however is not as efficient Goal status: Ongoing  4.  Pt. Will improve Left hand [facility] skills to be able to be able to independently, and efficiently manipulate small objects during ADL tasks. Baseline: Eval: left: 1 min. 9 sec. 10th visit: Left: 1 min. Pt. Is improving with manipulating small objects.  Goal status: Ongoing   5.  Pt. Will perform light meal preparation with modified independence Baseline: Eval: Pt. Reports having difficulty performing light  meal preparation, and cooking tasks. 10th visit: Pt. Is able to make toast in the morning, a sandwich,  use the microwave, make coffee, and pour herself a drink.  Goal status: Ongoing.   ASSESSMENT:  CLINICAL IMPRESSION:  Pt. reports bilateral bicep, and forearm soreness that started around 4:15 a.m. this morning. Pt. reports that it feels like she worked her arms yesterday. Pt. reports that she didn't do anything yesterday after getting an injection in her left eye. Pt. reports that she didn't sleep well last night.  Pt. presented with fatigue this morning.  Pt. required increased time to complete the task. Pt. was able to store each of the small items. Pt. was able to perform translatory movements with the sticks, and collars. Pt. dropped multiple 1/4" flat washers from her finger tips. Pt. was unable to store the flat washers while performing the translatory movements. Pt. moved one at a time. Pt. Continues to improve left hand function in order to improve, and maximize independence with ADLs, and IADL tasks.    PERFORMANCE DEFICITS in functional skills including ADLs, IADLs, coordination, dexterity, sensation, ROM, strength, FMC, decreased knowledge of use of DME, vision, and UE functional use, cognitive skills including attention, and psychosocial skills including coping strategies, environmental adaptation, habits, and routines and behaviors.   IMPAIRMENTS are limiting patient from ADLs, IADLs, and social participation.   COMORBIDITIES may have co-morbidities  that affects occupational performance. Patient will benefit from skilled OT to address above impairments and improve overall function.  MODIFICATION OR ASSISTANCE TO COMPLETE EVALUATION: Min-Moderate modification of tasks or assist with assess necessary to complete an evaluation.  OT OCCUPATIONAL PROFILE AND HISTORY: Detailed assessment: Review of records and additional review of physical, cognitive, psychosocial history related to current functional performance.  CLINICAL DECISION MAKING: Moderate - several treatment options, min-mod task modification necessary  REHAB POTENTIAL: Good  EVALUATION COMPLEXITY: Moderate    PLAN: OT FREQUENCY: 2x/week  OT DURATION: 12 weeks  PLANNED INTERVENTIONS: self care/ADL training, therapeutic exercise, therapeutic activity, neuromuscular re-education, manual therapy, passive range of motion, and paraffin  RECOMMENDED OTHER SERVICES: ST, and PT  CONSULTED AND AGREED WITH PLAN OF CARE: Patient  PLAN FOR NEXT SESSION:  L hand strengthening and  coordination exercises  Harrel Carina, MS, OTR/L   Harrel Carina, MS, OTR/L         OCCUPATIONAL [place]  Patient Name: Alyssa Mayo MRN: [id number] DOB:Dec 27, 1945, 76 y.o., female Today's Date: 03/14/2022  PCP: [place] Pink, MD REFERRING PROVIDER: Frann Rider, NP   OT End of Session - 05/14/22 1340     Visit Number 13    Number of Visits 24    Date for OT Re-Evaluation 05/27/22    Authorization  Type Progress reporting period starting 03/04/2022    OT Start Time 1300    OT Stop Time 1345    OT Time Calculation (min) 45 min    Activity Tolerance Patient tolerated treatment well    Behavior During Therapy [facility] for tasks assessed/performed             Past Medical History:  Diagnosis Date   Abnormal levels of other serum enzymes 07/17/2015   Arthritis    Constipation 07/11/2015   COVID-19 03/06/2021   Diabetes mellitus without complication ([place])    Elevated liver enzymes    Fatty liver    Generalized abdominal pain 07/11/2015   Hyperlipidemia    Hypertension    Lifelong obesity    Macular degeneration    Morbid obesity due to excess calories ([place]) 07/11/2015   Other fatigue 07/11/2015   Sleep apnea    Spinal headache    Past Surgical History:  Procedure Laterality Date   ABDOMINAL HYSTERECTOMY     APPENDECTOMY     CATARACT EXTRACTION     CERVICAL LAMINECTOMY  07/21/1985   CESAREAN SECTION     COLONOSCOPY  07/21/2012   diverticulosis, [facility] Dr. Donnella Sham    COLONOSCOPY WITH PROPOFOL N/A 02/04/2019   Procedure: COLONOSCOPY WITH PROPOFOL;  Surgeon: Lollie Sails, MD;  Location: [facility] ENDOSCOPY;  Service: Endoscopy;  Laterality: N/A;   COLONOSCOPY WITH PROPOFOL N/A 03/18/2021   Procedure: COLONOSCOPY WITH PROPOFOL;  Surgeon: Lesly Rubenstein, MD;  Location: [facility] ENDOSCOPY;  Service: Endoscopy;  Laterality: N/A;  DM   DIAGNOSTIC LAPAROSCOPY     ESOPHAGOGASTRODUODENOSCOPY (EGD) WITH PROPOFOL N/A 02/04/2019   Procedure:  ESOPHAGOGASTRODUODENOSCOPY (EGD) WITH PROPOFOL;  Surgeon: Lollie Sails, MD;  Location: [facility] ENDOSCOPY;  Service: Endoscopy;  Laterality: N/A;   ESOPHAGOGASTRODUODENOSCOPY (EGD) WITH PROPOFOL N/A 03/18/2021   Procedure: ESOPHAGOGASTRODUODENOSCOPY (EGD) WITH PROPOFOL;  Surgeon: Lesly Rubenstein, MD;  Location: [facility] ENDOSCOPY;  Service: Endoscopy;  Laterality: N/A;   LOOP RECORDER INSERTION N/A 08/05/2021   Procedure: LOOP RECORDER INSERTION;  Surgeon: Evans Lance, MD;  Location: [place] CV LAB;  Service: Cardiovascular;  Laterality: N/A;   SPINE SURGERY     TUBAL LIGATION     Patient Active Problem List   Diagnosis Date Noted   Cerebral edema ([place]) 08/07/2021   OSA (obstructive sleep apnea) 08/07/2021   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   CVA (cerebral vascular accident) ([place]) 08/01/2021   GERD (gastroesophageal reflux disease) 07/20/2019   Advanced care planning/counseling discussion 12/17/2016   Skin lesions, generalized 11/13/2016   Chronic right hip pain 06/17/2016   Vitamin D deficiency 09/26/2015   Diastasis recti 08/08/2015   Elevated serum GGT level 07/24/2015   Essential hypertension 07/17/2015   Fatty liver 07/17/2015   Elevated alkaline phosphatase level 07/17/2015   Elevated serum glutamic pyruvic transaminase (SGPT) level 07/17/2015   Abnormal finding on EKG 07/11/2015   Morbid obesity ([place]) 07/11/2015   Colon, diverticulosis 07/11/2015   Abdominal wall hernia 07/11/2015   DM (diabetes mellitus), type 2 with neurological complications ([place])    Hyperlipidemia     ONSET DATE: 08/01/2021  REFERRING DIAG: CVA  THERAPY DIAG:  Muscle weakness (generalized)  Rationale for Evaluation and Treatment Rehabilitation  SUBJECTIVE:   SUBJECTIVE STATEMENT:   Pt. Reports having a busy weekend ahead with birthday parties for her grandson..   Pt accompanied by: self  PERTINENT HISTORY:  Pt. Is a 76 y.o. female  presents with a diagnosis of  RMCA CVA  infarction with hemorrhagic transformation. Pt. Attended inpatient rehab from 08/07/2021-08/22/2021. Pt. received Home health therapy services this past spring.  Pt. Has recently had an assessment through driver rehabilitation services at [facility] with recommendations for referrals for  outpatient OT/PT, and ST services. Pt. PMHx includes: HTN, Hyperlipidemia, Macular degeneration, DM, and Obesity.  PRECAUTIONS: None  WEIGHT BEARING RESTRICTIONS No  PAIN:  Are you having pain? No.    FALLS: Has patient fallen in last 6 months? Yes.  LIVING ENVIRONMENT: Lives with: lives with their family  Son  Marya Mayo Lives in: House/apartment Main living are on one floor Stairs:  2 steps to enter Has following equipment at home: Single point cane, Wheelchair (manual), shower chair, Grab bars, bed rail, and rubber mat.  PLOF: Independent  PATIENT GOALS: To be able to Drive, and cook again  OBJECTIVE:   HAND DOMINANCE: Right   ADLs: Overall ADLs:  Transfers/ambulation related to ADLs: Independent Eating: Independent Grooming: Pt. Fatigues with sustained BUE's in elevation for haircare. Especially with blow drying hair. UB Dressing: Independent pullover shirt, independent now with fastening a bra LB Dressing: Independent donning pants socks, and slide on shoes Toileting: Independent Bathing: Independent Tub Shower transfers: Walk-in shower Independent now   IADLs: Shopping: Needs to be accompanied to the grocery store. Light housekeeping: Does own laundry, laundry,  Meal Prep:  Difficulty with cooking Community mobility: Relies on son, and friend Medication management: Son sets up Building surveyor management: Manages monthly bills independently. Handwriting: No change from baseline  MOBILITY STATUS: Hx of falls out of bed   ACTIVITY TOLERANCE: Activity tolerance:  10-20 min. Before rest break  FUNCTIONAL OUTCOME MEASURES: FOTO: 57  TR score: 62  UPPER EXTREMITY ROM     Active  ROM Right eval Left eval Left 05/06/2022  Shoulder flexion WFL 96(110) 107(110)  Shoulder abduction WFL 82(105) 86(105)  Shoulder adduction     Shoulder extension     Shoulder internal rotation     Shoulder external rotation     Elbow flexion [facility] WFL  Elbow extension [facility] WFL  Wrist flexion WFL 44(60) 68(68)  Wrist extension WFL 32(60 66(66)  Wrist ulnar deviation     Wrist radial deviation     Wrist pronation     Wrist supination     (Blank rows = not tested)   UPPER EXTREMITY MMT:     MMT Right eval Left eval Left 05/06/2022  Shoulder flexion 4+/5 3-/5 3+/5  Shoulder abduction 4+/5 3-/5 3-/5  Shoulder adduction     Shoulder extension     Shoulder internal rotation     Shoulder external rotation     Middle trapezius     Lower trapezius     Elbow flexion 4+/5 4-/5 4+/5  Elbow extension 4+/5 4-/5 4+/5  Wrist flexion 4+/5 3-/5 4/5  Wrist extension 4+/5 3/5 4/5  Wrist ulnar deviation     Wrist radial deviation     Wrist pronation     Wrist supination     (Blank rows = not tested)  HAND FUNCTION: Grip strength: Right: 30 lbs; Left: 13 lbs, Lateral pinch: Right: 15 lbs, Left: 8 lbs, and 3 point pinch: Right: 13 lbs, Left: 7 lbs  05/06/2022 Grip strength: Right: 30 lbs; Left: 14 lbs, Lateral pinch: Right: 15 lbs, Left: 10 lbs, and 3 point pinch: Right: 13 lbs, Left: 7 lbs   COORDINATION: 9 Hole Peg test: Right: 26  sec; Left: 1 min. & 9 sec  05/06/2022 9 Hole  Peg test: Right: 26  sec; Left: 1 min.  SENSATION: Light touch: WFL Proprioception: WFL  COGNITION: Overall cognitive status: Within functional limits for tasks assessed  VISION: Subjective report: Glasses. Just received a new prescription to increase bifocal strength Baseline vision: Macular Degeneration Visual history: macular degeneration  VISION ASSESSMENT: To be further assessed in functional context  Left sided Awareness    PERCEPTION: Limited left sided awareness   TODAY'S  TREATMENT:   Neuromuscular re-education:  Pt. worked on grasping, flipping and stacking 1&1/2" large pegs on the [employer] board placed at a 45 degree vertical incline angle. Pt. worked on placing the pegs onto the large pegboard placed in front of her following [place] patterns placed vertically to the right of the board.   Pt. required cues for movement patterns moving 3-4 of the pegs through her hand from storing them in the palm of her hand to moving them to the tip of the 2nd digit, and thumb in preparation for placing them onto the Pegboard placed at a 45 degree incline angle. Pt.'s LUE fatigued with reaching up to place the pegs into the board. Pt. required cues at times for accuracy of placement of pegs on the board following the moderately difficult level design pattern. Pt. Continues to improve left hand function in order to improve, and maximize independence with ADLs, and IADL tasks.     PATIENT EDUCATION: Education details: Kitchen safety/use of external memory aids to turn off burners, limit distractions when in kitchen Person educated: Patient Education method: verbal cues Education comprehension: verbalized understanding and pt does not plan to cook unless son is present.   HOME EXERCISE PROGRAM:  Continue with an ongoing assessment of HEP needs    GOALS: Goals reviewed with patient? Yes   SHORT TERM GOALS: Target date:  04/15/2022      Pt. Will improve FOTO score by 2 points to reflect improved pt. perceived functional performance Baseline: FOTO: 57, TR score: 62 10th visit: FOTO: 65, TR score: 62 Goal status: Achieved  LONG TERM GOALS: Target date: 05/27/2022    Pt. Will improve left UE strength by 2 mm grades to assist with repositioning her grandson on her lap. Baseline: Eval: left shoulder flexion: 3-/5, abduction 3-/5,  elbow flex/ext. 4-/5, wrist extension 3/5, wrist flexion 3-/5  10th visit: left shoulder flexion: 3+/5, abduction 3-+5,  elbow  flex/ext. 4+/5, wrist extension 4/5, wrist flexion 34/5  Goal status: Ongoing  2.  Pt. will improve left grip strength by 5# to be able to independently hold and use a blow dryer, and brush for hair care. Baseline: Eval: Left grip strength 13#. Pt. Has difficulty performing hair care, holding and using a brush, and blow dryer. 10th visit: Left grip 14#. Pt. Is now able to hold th e blow dryer for the sides, and top of head, not the back. Goal status: Ongoing  3.  Pt. Will improve left lateral pinch strength by 3# to be able to independently cut meat. Baseline: Eval: Left 8, Pt. Has difficulty cutting meat. 10th: Left: 10#.  Pt. Is able to cut tender meat, however is not as efficient Goal status: Ongoing  4.  Pt. Will improve Left hand [facility] skills to be able to be able to independently, and efficiently manipulate small objects during ADL tasks. Baseline: Eval: left: 1 min. 9 sec. 10th visit: Left: 1 min. Pt. Is improving with manipulating small objects.  Goal status: Ongoing   5.  Pt. Will perform light meal  preparation with modified independence Baseline: Eval: Pt. Reports having difficulty performing light  meal preparation, and cooking tasks. 10th visit: Pt. Is able to make toast in the morning, a sandwich,  use the microwave, make coffee, and pour herself a drink.  Goal status: Ongoing.   ASSESSMENT:  CLINICAL IMPRESSION:  Pt. required cues for movement patterns moving 3-4 of the pegs through her hand from storing them in the palm of her hand to moving them to the tip of the 2nd digit, and thumb in preparation for placing them onto the Pegboard placed at a 45 degree incline angle. Pt.'s LUE fatigued with reaching up to place the pegs into the board. Pt. required cues at times for accuracy of placement of pegs on the board following the moderately difficult level design pattern. Pt. Continues to improve left hand function in order to improve, and maximize independence with ADLs, and IADL  tasks.    PERFORMANCE DEFICITS in functional skills including ADLs, IADLs, coordination, dexterity, sensation, ROM, strength, FMC, decreased knowledge of use of DME, vision, and UE functional use, cognitive skills including attention, and psychosocial skills including coping strategies, environmental adaptation, habits, and routines and behaviors.   IMPAIRMENTS are limiting patient from ADLs, IADLs, and social participation.   COMORBIDITIES may have co-morbidities  that affects occupational performance. Patient will benefit from skilled OT to address above impairments and improve overall function.  MODIFICATION OR ASSISTANCE TO COMPLETE EVALUATION: Min-Moderate modification of tasks or assist with assess necessary to complete an evaluation.  OT OCCUPATIONAL PROFILE AND HISTORY: Detailed assessment: Review of records and additional review of physical, cognitive, psychosocial history related to current functional performance.  CLINICAL DECISION MAKING: Moderate - several treatment options, min-mod task modification necessary  REHAB POTENTIAL: Good  EVALUATION COMPLEXITY: Moderate    PLAN: OT FREQUENCY: 2x/week  OT DURATION: 12 weeks  PLANNED INTERVENTIONS: self care/ADL training, therapeutic exercise, therapeutic activity, neuromuscular re-education, manual therapy, passive range of motion, and paraffin  RECOMMENDED OTHER SERVICES: ST, and PT  CONSULTED AND AGREED WITH PLAN OF CARE: Patient  PLAN FOR NEXT SESSION:  L hand strengthening and coordination exercises  Harrel Carina, MS, OTR/L   Harrel Carina, MS, OTR/L

## 2022-05-16 ENCOUNTER — Ambulatory Visit: Payer: Medicare HMO | Admitting: Speech Pathology

## 2022-05-16 DIAGNOSIS — R41841 Cognitive communication deficit: Secondary | ICD-10-CM | POA: Diagnosis not present

## 2022-05-16 DIAGNOSIS — I63511 Cerebral infarction due to unspecified occlusion or stenosis of right middle cerebral artery: Secondary | ICD-10-CM

## 2022-05-20 NOTE — Therapy (Signed)
OUTPATIENT PHYSICAL THERAPY NEURO TREATMENT/PHYSICAL THERAPY PROGRESS NOTE   Dates of reporting period  04/02/22   to   05/21/22    Patient Name: Alyssa Mayo MRN: [id number] DOB:[birth year], 76 y.o., female Today's Date: 05/21/2022   PCP: [place] Pink, MD REFERRING PROVIDER: Frann Rider, NP   PT End of Session - 05/21/22 1149     Visit Number 10    Number of Visits 25    Date for PT Re-Evaluation 06/25/22    Authorization Type [employer] Medicare    Authorization Time Period 04/02/2022-06/25/2022    PT Start Time 1146    PT Stop Time 1230    PT Time Calculation (min) 44 min    Equipment Utilized During Treatment Gait belt    Activity Tolerance Patient tolerated treatment well    Behavior During Therapy [facility] for tasks assessed/performed                Past Medical History:  Diagnosis Date   Abnormal levels of other serum enzymes 07/17/2015   Arthritis    Constipation 07/11/2015   COVID-19 03/06/2021   Diabetes mellitus without complication ([place])    Elevated liver enzymes    Fatty liver    Generalized abdominal pain 07/11/2015   Hyperlipidemia    Hypertension    Lifelong obesity    Macular degeneration    Morbid obesity due to excess calories ([place]) 07/11/2015   Other fatigue 07/11/2015   Sleep apnea    Spinal headache    Past Surgical History:  Procedure Laterality Date   ABDOMINAL HYSTERECTOMY     APPENDECTOMY     CATARACT EXTRACTION     CERVICAL LAMINECTOMY  07/21/1985   CESAREAN SECTION     COLONOSCOPY  07/21/2012   diverticulosis, [facility] Dr. Donnella Sham    COLONOSCOPY WITH PROPOFOL N/A 02/04/2019   Procedure: COLONOSCOPY WITH PROPOFOL;  Surgeon: Lollie Sails, MD;  Location: [facility] ENDOSCOPY;  Service: Endoscopy;  Laterality: N/A;   COLONOSCOPY WITH PROPOFOL N/A 03/18/2021   Procedure: COLONOSCOPY WITH PROPOFOL;  Surgeon: Lesly Rubenstein, MD;  Location: [facility] ENDOSCOPY;  Service: Endoscopy;  Laterality: N/A;  DM   DIAGNOSTIC LAPAROSCOPY      ESOPHAGOGASTRODUODENOSCOPY (EGD) WITH PROPOFOL N/A 02/04/2019   Procedure: ESOPHAGOGASTRODUODENOSCOPY (EGD) WITH PROPOFOL;  Surgeon: Lollie Sails, MD;  Location: [facility] ENDOSCOPY;  Service: Endoscopy;  Laterality: N/A;   ESOPHAGOGASTRODUODENOSCOPY (EGD) WITH PROPOFOL N/A 03/18/2021   Procedure: ESOPHAGOGASTRODUODENOSCOPY (EGD) WITH PROPOFOL;  Surgeon: Lesly Rubenstein, MD;  Location: [facility] ENDOSCOPY;  Service: Endoscopy;  Laterality: N/A;   LOOP RECORDER INSERTION N/A 08/05/2021   Procedure: LOOP RECORDER INSERTION;  Surgeon: Evans Lance, MD;  Location: [place] CV LAB;  Service: Cardiovascular;  Laterality: N/A;   [place]     TUBAL LIGATION     Patient Active Problem List   Diagnosis Date Noted   Cerebral edema ([place]) 08/07/2021   OSA (obstructive sleep apnea) 08/07/2021   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   CVA (cerebral vascular accident) ([place]) 08/01/2021   GERD (gastroesophageal reflux disease) 07/20/2019   Advanced care planning/counseling discussion 12/17/2016   Skin lesions, generalized 11/13/2016   Chronic right hip pain 06/17/2016   Vitamin D deficiency 09/26/2015   Diastasis recti 08/08/2015   Elevated serum GGT level 07/24/2015   Essential hypertension 07/17/2015   Fatty liver 07/17/2015   Elevated alkaline phosphatase level 07/17/2015   Elevated serum glutamic pyruvic transaminase (SGPT) level 07/17/2015   Abnormal finding on EKG 07/11/2015  Morbid obesity ([place]) 07/11/2015   Colon, diverticulosis 07/11/2015   Abdominal wall hernia 07/11/2015   DM (diabetes mellitus), type 2 with neurological complications [facility])    Hyperlipidemia     ONSET DATE: January 11th 2023  REFERRING DIAG:  I69.398,R26.9 (ICD-10-CM) - Gait disturbance, post-stroke  I63.511 (ICD-10-CM) - Right middle cerebral artery stroke (HCC)    THERAPY DIAG:  Difficulty in walking, not elsewhere classified  Muscle weakness (generalized)  Other lack of  coordination  Cognitive communication deficit  Right middle cerebral artery stroke ([place])  Unsteadiness on feet  Rationale for Evaluation and Treatment Rehabilitation  SUBJECTIVE:                                                                                                                                                                                              SUBJECTIVE STATEMENT:  Pt denies any pain upon arrival to the clinic today.  Pt notes she had to use a lot of her brain during SLP and OT sessions prior to PT appointment.  Pt reports she participated in [employer] last night.   Pt accompanied by: self  PERTINENT HISTORY: Per chart and confirmed by pt:  Pt is a 76 yo female with RMCA CVA infarction with hemorrhagic transformation 08/01/2021, with inpatient rehab 08/07/2021-08/22/2021 followed by [facility] therapy until June. Pt now currently being seen for outpatient OT and ST services. Pt reports limitations in stamina persist. Other PMH is significant for HTN, HLD, macular degeneration, DM, obesity, sleep apnea, arthritis, chronic R hip pain, vitamin D deficiency, diastasis recti, abdominal wall hernia, hx of abdominal hysterectomy, appendectomy, spine surgery (years ago).   PAIN:  Are you having pain? No Pain Location:    TODAY'S TREATMENT:   TherEx:  3# weights donned  Seated march 2x15 each LE Seated LAQ 2x15 each LE  Standing heel raises x15 B  - progressed to single leg heel raise x10 each LE  Seated rolling chair hamstring scoots forward/backward, 10 meters x2  Goal assessment performed and noted below:   PATIENT EDUCATION: Education details: exam findings, indications, plan, HEP Person educated: Patient Education method: Consulting civil engineer, Demonstration, Verbal cues, and Handouts Education comprehension: verbalized understanding, returned demonstration, verbal cues required, and needs further education   HOME EXERCISE PROGRAM: 05/01/22: Pt and son educated  to perform NBOS cross body reaching at home, at [employer]  Access Code: [email or web address] URL: [email or web address] Date: 04/02/2022 Prepared by: Ricard Dillon  Exercises - Seated March  - 1 x daily - 5 x weekly - 3 sets - 15 reps - Standing Single Leg Stance with Counter Support  - 1 x  daily - 7 x weekly - 2 sets - 2 reps - 30 seconds hold    GOALS: Goals reviewed with patient? Yes  SHORT TERM GOALS: Target date: 07/02/2022  Patient will be independent in home exercise program to improve strength/mobility for better functional independence with ADLs. Baseline: initiated Goal status: INITIAL   LONG TERM GOALS: Target date: 08/13/2022  Patient will increase FOTO score to equal to or greater than 65  to demonstrate improvement in mobility and quality of life.  Baseline: 57 Goal status: INITIAL  2.  Patient (> 29 years old) will complete five times sit to stand test in < 15 seconds indicating an increased LE strength and improved balance. Baseline: 17 sec hands-free; 11/1: 15.36 sec Goal status: IN-PROGRESS  3.  Patient will increase 10 meter walk test to >1.48ms as to improve gait speed for better community ambulation and to reduce fall risk. Baseline: 0.66 m/s; 11/1:  0.85 m/s Goal status: IN-PROGRESS  4.  Patient will increase Berg Balance score by > 3 points to demonstrate decreased fall risk during functional activities. Baseline: 50/56 Goal status: INITIAL  5.  Patient will increase six minute walk test distance to >1000 for progression to community ambulator and improve gait ability Baseline: 04/10/22: 6722f(pain onset at 5 minutes); 05/21/22: 1027 ft Goal status: MET    ASSESSMENT:  CLINICAL IMPRESSION:  Pt is making significant improvement towards goals as noted above.  Pt also noted to be able to perform increased resistance during exercises and also introduced to hamstring scoots that challenged her posterior chain while performing.  Pt noted to be  feeling the "burn" behind her legs noting she was getting a good workout.  Pt to continue to benefit from balance related tass and improve over all function with current POC.        OBJECTIVE IMPAIRMENTS Abnormal gait, decreased activity tolerance, decreased balance, decreased coordination, decreased endurance, decreased mobility, difficulty walking, decreased strength, impaired sensation, impaired UE functional use, improper body mechanics, postural dysfunction, and pain.   ACTIVITY LIMITATIONS carrying, lifting, bending, standing, squatting, stairs, bed mobility, bathing, reach over head, hygiene/grooming, and locomotion level  PARTICIPATION LIMITATIONS: meal prep, cleaning, laundry, medication management, personal finances, driving, shopping, community activity, and yard work  PERSONAL FACTORS Age, Sex, Time since onset of injury/illness/exacerbation, and 3+ comorbidities: Other PMH is significant for HTN, HLD, macular degeneration, DM, obesity, sleep apnea, arthritis, chronic R hip pain, vitamin D deficiency, diastasis recti, abdominal wall hernia, hx of abdominal hysterectomy, appendectomy, spine surgery (years ago).   are also affecting patient's functional outcome.   REHAB POTENTIAL: Good  CLINICAL DECISION MAKING: Evolving/moderate complexity  EVALUATION COMPLEXITY: Moderate  PLAN: PT FREQUENCY: 2x/week  PT DURATION: 12 weeks  PLANNED INTERVENTIONS: Therapeutic exercises, Therapeutic activity, Neuromuscular re-education, Balance training, Gait training, Patient/Family education, Self Care, Joint mobilization, Joint manipulation, Stair training, Vestibular training, Canalith repositioning, Orthotic/Fit training, DME instructions, Dry Needling, Electrical stimulation, Wheelchair mobility training, Spinal mobilization, Cryotherapy, Moist heat, Splintting, Taping, Traction, Ultrasound, Biofeedback, Manual therapy, and Re-evaluation  PLAN FOR NEXT SESSION:  multidirectional reaching  coupled with cognitive/balance, strength, balance, gait   JoGwenlyn SaranPT, DPT Physical Therapist- [facility]11/01/23, 2:28 PM

## 2022-05-20 NOTE — Therapy (Addendum)
OUTPATIENT SPEECH LANGUAGE PATHOLOGY TREATMENT NOTE   Patient Name: Alyssa Mayo MRN: [id number] DOB:1945-08-29, 76 y.o., female Today's Date: 05/20/2022   PCP: [place] Pink, MD REFERRING PROVIDER: Frann Rider, NP  END OF SESSION:   End of Session - 05/20/22 1718     Visit Number 11    Number of Visits 25    Date for SLP Re-Evaluation 05/27/22    Authorization Type [employer]na Medicare HMO/PPO    Progress Note Due on Visit 20    SLP Start Time 1100    SLP Stop Time  1200    SLP Time Calculation (min) 60 min    Activity Tolerance Patient tolerated treatment well             Past Medical History:  Diagnosis Date   Abnormal levels of other serum enzymes 07/17/2015   Arthritis    Constipation 07/11/2015   COVID-19 03/06/2021   Diabetes mellitus without complication ([place])    Elevated liver enzymes    Fatty liver    Generalized abdominal pain 07/11/2015   Hyperlipidemia    Hypertension    Lifelong obesity    Macular degeneration    Morbid obesity due to excess calories ([place]) 07/11/2015   Other fatigue 07/11/2015   Sleep apnea    Spinal headache    Past Surgical History:  Procedure Laterality Date   ABDOMINAL HYSTERECTOMY     APPENDECTOMY     CATARACT EXTRACTION     CERVICAL LAMINECTOMY  07/21/1985   CESAREAN SECTION     COLONOSCOPY  07/21/2012   diverticulosis, [facility] Dr. Donnella Sham    COLONOSCOPY WITH PROPOFOL N/A 02/04/2019   Procedure: COLONOSCOPY WITH PROPOFOL;  Surgeon: Lollie Sails, MD;  Location: [facility] ENDOSCOPY;  Service: Endoscopy;  Laterality: N/A;   COLONOSCOPY WITH PROPOFOL N/A 03/18/2021   Procedure: COLONOSCOPY WITH PROPOFOL;  Surgeon: Lesly Rubenstein, MD;  Location: [facility] ENDOSCOPY;  Service: Endoscopy;  Laterality: N/A;  DM   DIAGNOSTIC LAPAROSCOPY     ESOPHAGOGASTRODUODENOSCOPY (EGD) WITH PROPOFOL N/A 02/04/2019   Procedure: ESOPHAGOGASTRODUODENOSCOPY (EGD) WITH PROPOFOL;  Surgeon: Lollie Sails, MD;  Location: [facility] ENDOSCOPY;   Service: Endoscopy;  Laterality: N/A;   ESOPHAGOGASTRODUODENOSCOPY (EGD) WITH PROPOFOL N/A 03/18/2021   Procedure: ESOPHAGOGASTRODUODENOSCOPY (EGD) WITH PROPOFOL;  Surgeon: Lesly Rubenstein, MD;  Location: [facility] ENDOSCOPY;  Service: Endoscopy;  Laterality: N/A;   LOOP RECORDER INSERTION N/A 08/05/2021   Procedure: LOOP RECORDER INSERTION;  Surgeon: Evans Lance, MD;  Location: [place] CV LAB;  Service: Cardiovascular;  Laterality: N/A;   SPINE SURGERY     TUBAL LIGATION     Patient Active Problem List   Diagnosis Date Noted   Cerebral edema ([place]) 08/07/2021   OSA (obstructive sleep apnea) 08/07/2021   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   CVA (cerebral vascular accident) ([place]) 08/01/2021   GERD (gastroesophageal reflux disease) 07/20/2019   Advanced care planning/counseling discussion 12/17/2016   Skin lesions, generalized 11/13/2016   Chronic right hip pain 06/17/2016   Vitamin D deficiency 09/26/2015   Diastasis recti 08/08/2015   Elevated serum GGT level 07/24/2015   Essential hypertension 07/17/2015   Fatty liver 07/17/2015   Elevated alkaline phosphatase level 07/17/2015   Elevated serum glutamic pyruvic transaminase (SGPT) level 07/17/2015   Abnormal finding on EKG 07/11/2015   Morbid obesity ([place]) 07/11/2015   Colon, diverticulosis 07/11/2015   Abdominal wall hernia 07/11/2015   DM (diabetes mellitus), type 2 with neurological complications ([place])    Hyperlipidemia  ONSET DATE: 08/01/2021 CVA; 02/26/2022 date of referral   REFERRING DIAG: I69.319 (ICD-10-CM) - Cognitive deficit, post-stroke I63.511 (ICD-10-CM) - Right middle cerebral artery stroke [facility])     PERTINENT HISTORY: Ms. Kazzandra Desaulniers is a 76 y.o. female with history of diabetes, hypertension, hyperlipidemia, obesity, and sleep apnea who presented to [facility] ED on 08/01/2021 after being found down by her son. Her last know well was approximately 07/29/2021. Pt transferred to [facility] for  further CVA management. In addition, pt received ST services in [place] from 08/08/2021 thru 08/21/2021.      DIAGNOSTIC FINDINGS: MRI showed extensive restricted diffusion throughout much of the right MCA territory with associated changes of hemorrhagic transformation and localized cerebral edema and mass effect. Per Dr. Leonie Man, felt embolic pattern secondary to unclear source although concerning for occult A fib. Repeat [facility] 1/14 showed no interval progression of right MCA hemorrhagic infarct with petechial hemorrhage.  CTA head/neck showed right M2 thrombus.  EF 60 to 65%.   THERAPY DIAG:  Cognitive communication deficit  Right middle cerebral artery stroke ([place])  Rationale for Evaluation and Treatment Rehabilitation  SUBJECTIVE: "I made myself soup several times"  Pt accompanied by: family member  PAIN:  Are you having pain? No  PATIENT GOALS to be able to drive again and live independently (she currently lives alone but her son plans on moving in with her)  OBJECTIVE:   TODAY'S TREATMENT: Skilled treatment session focused on pt's cognitive communication goals. SLP facilitated the session by providing the following interventions:  SLP provided instruction on creating design based on black line drawing. SLP provided pt with basic diamond shape. Pt completed accurately and within an appropriate amount of time.   Focused on safety awareness by viewing different safety picture cards; Pt was independent in identifying safe vs unsafe situations as well providing a safe solution to each situation    PATIENT EDUCATION: Education details: cognitive organization; directional awareness for navigating the hallways Person educated: Patient and Child(ren) Education method: Explanation, Demonstration, Verbal cues, and Handouts Education comprehension: needs further education  GOALS: Goals reviewed with patient? Yes  REPORTING PERIOD 03/04/2022 to 05/13/2022 SHORT TERM GOALS: Target date: 10  sessions   With moderate assistance, pt will recall her medicines with 80% accuracy.  Baseline: currently unable Goal status: MET; 91% recall independently   2.  With minimal assistance, pt will complete basic medication management task with > 90% accuracy.  Baseline: total Goal status: ONGOING; currently requiring moderate assistance    3.  Pt will demonstrate selective attention to basic task for 15 minutes with < 2 redirections.  Baseline: moderate assistance Goal status: ONGOING; currently requiring minimal assistance   4.  Pt will complete basic bill paying task with 75% accuracy.  Baseline: total Goal status: ONGOING; currently at 50% with moderate assistance  5. With minimal assistance, pt will use strategies to complete semi-complex visuospatial tasks with 85% accuracy.   Baseline: moderate  Goal status: INITIAL  6. Pt will require < 2 cues to initiate leaving session in 5 out of 6 opportunities.   Baseline: 6 cues  Goal status: INITIAL     LONG TERM GOALS: Target date: 05/27/2022 With rare min A, pt will organize her medicines accurately in a pill organizer.  Baseline: Total Goal status: ONGOING   2.  With rare min A, pt will demonstrate selective attention to task for 30 minutes.  Baseline: moderate Goal status: ONGOING   3.  With rare min A, pt will sequence  basic meal prep activity.  Baseline: Moderate Goal status: ONGOING      ASSESSMENT:  CLINICAL IMPRESSION: While pt continues to present with moderate cognitive communication impairments, she presents with improved ability to utilize compensatory problem solving strategies to achieve improved accuracy with medication management. Her acute deficits include increased distractibility, decreased task initiation, self-awareness of deficits and possible visual perceptual deficits as well as recall of directional navigation.    OBJECTIVE IMPAIRMENTS include attention and executive functioning. These  impairments are limiting patient from managing medications, managing appointments, managing finances, household responsibilities, and ADLs/IADLs. Factors affecting potential to achieve goals and functional outcome are ability to learn/carryover information. Patient will benefit from skilled SLP services to address above impairments and improve overall function.  REHAB POTENTIAL: Good  PLAN: SLP FREQUENCY: 1-2x/week  SLP DURATION: 12 weeks  PLANNED INTERVENTIONS: Functional tasks, SLP instruction and feedback, and Patient/family education   Nilesh Stegall B. Rutherford Nail, M.S., CCC-SLP, Mining engineer Certified Brain Injury [place]  [place] Office [phone] Ascom [phone] Fax [phone]

## 2022-05-21 ENCOUNTER — Ambulatory Visit: Payer: Medicare HMO | Attending: Adult Health | Admitting: Speech Pathology

## 2022-05-21 ENCOUNTER — Ambulatory Visit: Payer: Medicare HMO | Admitting: Occupational Therapy

## 2022-05-21 ENCOUNTER — Encounter: Payer: Self-pay | Admitting: Occupational Therapy

## 2022-05-21 ENCOUNTER — Ambulatory Visit: Payer: Medicare HMO

## 2022-05-21 DIAGNOSIS — R262 Difficulty in walking, not elsewhere classified: Secondary | ICD-10-CM

## 2022-05-21 DIAGNOSIS — M6281 Muscle weakness (generalized): Secondary | ICD-10-CM

## 2022-05-21 DIAGNOSIS — I63511 Cerebral infarction due to unspecified occlusion or stenosis of right middle cerebral artery: Secondary | ICD-10-CM

## 2022-05-21 DIAGNOSIS — R278 Other lack of coordination: Secondary | ICD-10-CM | POA: Diagnosis present

## 2022-05-21 DIAGNOSIS — R2681 Unsteadiness on feet: Secondary | ICD-10-CM

## 2022-05-21 DIAGNOSIS — R482 Apraxia: Secondary | ICD-10-CM | POA: Diagnosis present

## 2022-05-21 DIAGNOSIS — R41841 Cognitive communication deficit: Secondary | ICD-10-CM

## 2022-05-21 NOTE — Therapy (Signed)
OCCUPATIONAL THERAPY TREATMENT  Patient Name: Alyssa Mayo MRN: [id number] DOB:04-27-1946, 76 y.o., female Today's Date: 03/14/2022  PCP: [name], MD REFERRING PROVIDER: [name], NP   OT End of Session - 05/21/22 1106     Visit Number 14    Number of Visits 24    Date for OT Re-Evaluation 05/27/22    Authorization Type Progress reporting period starting 03/04/2022    OT Start Time 1100    OT Stop Time 1145    OT Time Calculation (min) 45 min    Activity Tolerance Patient tolerated treatment well    Behavior During Therapy [facility] for tasks assessed/performed             Past Medical History:  Diagnosis Date   Abnormal levels of other serum enzymes 07/17/2015   Arthritis    Constipation 07/11/2015   COVID-19 03/06/2021   Diabetes mellitus without complication (HCC)    Elevated liver enzymes    Fatty liver    Generalized abdominal pain 07/11/2015   Hyperlipidemia    Hypertension    Lifelong obesity    Macular degeneration    Morbid obesity due to excess calories (HCC) 07/11/2015   Other fatigue 07/11/2015   Sleep apnea    Spinal headache    Past Surgical History:  Procedure Laterality Date   ABDOMINAL HYSTERECTOMY     APPENDECTOMY     CATARACT EXTRACTION     CERVICAL LAMINECTOMY  07/21/1985   CESAREAN SECTION     COLONOSCOPY  07/21/2012   diverticulosis, [facility] Dr. [name]    COLONOSCOPY WITH PROPOFOL N/A 02/04/2019   Procedure: COLONOSCOPY WITH PROPOFOL;  Surgeon: [name], MD;  Location: [facility] ENDOSCOPY;  Service: Endoscopy;  Laterality: N/A;   COLONOSCOPY WITH PROPOFOL N/A 03/18/2021   Procedure: COLONOSCOPY WITH PROPOFOL;  Surgeon: [name], MD;  Location: [facility] ENDOSCOPY;  Service: Endoscopy;  Laterality: N/A;  DM   DIAGNOSTIC LAPAROSCOPY     ESOPHAGOGASTRODUODENOSCOPY (EGD) WITH PROPOFOL N/A 02/04/2019   Procedure: ESOPHAGOGASTRODUODENOSCOPY (EGD) WITH PROPOFOL;  Surgeon: [name], MD;  Location: [facility]  ENDOSCOPY;  Service: Endoscopy;  Laterality: N/A;   ESOPHAGOGASTRODUODENOSCOPY (EGD) WITH PROPOFOL N/A 03/18/2021   Procedure: ESOPHAGOGASTRODUODENOSCOPY (EGD) WITH PROPOFOL;  Surgeon: [name], MD;  Location: [facility] ENDOSCOPY;  Service: Endoscopy;  Laterality: N/A;   LOOP RECORDER INSERTION N/A 08/05/2021   Procedure: LOOP RECORDER INSERTION;  Surgeon: [name], MD;  Location: [facility] INVASIVE CV LAB;  Service: Cardiovascular;  Laterality: N/A;   SPINE SURGERY     TUBAL LIGATION     Patient Active Problem List   Diagnosis Date Noted   Cerebral edema (HCC) 08/07/2021   OSA (obstructive sleep apnea) 08/07/2021   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   CVA (cerebral vascular accident) (HCC) 08/01/2021   GERD (gastroesophageal reflux disease) 07/20/2019   Advanced care planning/counseling discussion 12/17/2016   Skin lesions, generalized 11/13/2016   Chronic right hip pain 06/17/2016   Vitamin D deficiency 09/26/2015   Diastasis recti 08/08/2015   Elevated serum GGT level 07/24/2015   Essential hypertension 07/17/2015   Fatty liver 07/17/2015   Elevated alkaline phosphatase level 07/17/2015   Elevated serum glutamic pyruvic transaminase (SGPT) level 07/17/2015   Abnormal finding on EKG 07/11/2015   Morbid obesity (HCC) 07/11/2015   Colon, diverticulosis 07/11/2015   Abdominal wall hernia 07/11/2015   DM (diabetes mellitus), type 2 with neurological complications (HCC)    Hyperlipidemia  ONSET DATE: 08/01/2021  REFERRING DIAG: CVA  THERAPY DIAG:  Muscle weakness (generalized)  Other lack of coordination  Rationale for Evaluation and Treatment Rehabilitation  SUBJECTIVE:   SUBJECTIVE STATEMENT:   Pt. reports doing well today, No pain.  Pt accompanied by: self  PERTINENT HISTORY:  Pt. Is a 76 y.o. female  presents with a diagnosis of RMCA CVA infarction with hemorrhagic transformation. Pt. Attended inpatient rehab from 08/07/2021-08/22/2021. Pt.  received Home health therapy services this past spring.  Pt. Has recently had an assessment through driver rehabilitation services at [facility] with recommendations for referrals for  outpatient OT/PT, and ST services. Pt. PMHx includes: HTN, Hyperlipidemia, Macular degeneration, DM, and Obesity.  PRECAUTIONS: None  WEIGHT BEARING RESTRICTIONS No  PAIN:  Are you having pain? No.    FALLS: Has patient fallen in last 6 months? Yes.  LIVING ENVIRONMENT: Lives with: lives with their family  Son  [name] Lives in: House/apartment Main living are on one floor Stairs:  2 steps to enter Has following equipment at home: Single point cane, Wheelchair (manual), shower chair, Grab bars, bed rail, and rubber mat.  PLOF: Independent  PATIENT GOALS: To be able to Drive, and cook again  OBJECTIVE:   HAND DOMINANCE: Right   ADLs: Overall ADLs:  Transfers/ambulation related to ADLs: Independent Eating: Independent Grooming: Pt. Fatigues with sustained BUE's in elevation for haircare. Especially with blow drying hair. UB Dressing: Independent pullover shirt, independent now with fastening a bra LB Dressing: Independent donning pants socks, and slide on shoes Toileting: Independent Bathing: Independent Tub Shower transfers: Walk-in shower Independent now   IADLs: Shopping: Needs to be accompanied to the grocery store. Light housekeeping: Does own laundry, laundry,  Meal Prep:  Difficulty with cooking Community mobility: Relies on son, and friend Medication management: Son sets up Mining engineer management: Manages monthly bills independently. Handwriting: No change from baseline  MOBILITY STATUS: Hx of falls out of bed   ACTIVITY TOLERANCE: Activity tolerance:  10-20 min. Before rest break  FUNCTIONAL OUTCOME MEASURES: FOTO: 57  TR score: 62  UPPER EXTREMITY ROM     Active ROM Right eval Left eval Left 05/06/2022  Shoulder flexion WFL 96(110) 107(110)  Shoulder abduction WFL  82(105) 86(105)  Shoulder adduction     Shoulder extension     Shoulder internal rotation     Shoulder external rotation     Elbow flexion [facility] WFL  Elbow extension [facility] WFL  Wrist flexion WFL 44(60) 68(68)  Wrist extension WFL 32(60 66(66)  Wrist ulnar deviation     Wrist radial deviation     Wrist pronation     Wrist supination     (Blank rows = not tested)   UPPER EXTREMITY MMT:     MMT Right eval Left eval Left 05/06/2022  Shoulder flexion 4+/5 3-/5 3+/5  Shoulder abduction 4+/5 3-/5 3-/5  Shoulder adduction     Shoulder extension     Shoulder internal rotation     Shoulder external rotation     Middle trapezius     Lower trapezius     Elbow flexion 4+/5 4-/5 4+/5  Elbow extension 4+/5 4-/5 4+/5  Wrist flexion 4+/5 3-/5 4/5  Wrist extension 4+/5 3/5 4/5  Wrist ulnar deviation     Wrist radial deviation     Wrist pronation     Wrist supination     (Blank rows = not tested)  HAND FUNCTION: Grip strength: Right: 30 lbs; Left: 13 lbs, Lateral pinch:  Right: 15 lbs, Left: 8 lbs, and 3 point pinch: Right: 13 lbs, Left: 7 lbs  05/06/2022 Grip strength: Right: 30 lbs; Left: 14 lbs, Lateral pinch: Right: 15 lbs, Left: 10 lbs, and 3 point pinch: Right: 13 lbs, Left: 7 lbs   COORDINATION: 9 Hole Peg test: Right: 26  sec; Left: 1 min. & 9 sec  05/06/2022 9 Hole Peg test: Right: 26  sec; Left: 1 min.  SENSATION: Light touch: WFL Proprioception: WFL  COGNITION: Overall cognitive status: Within functional limits for tasks assessed  VISION: Subjective report: Glasses. Just received a new prescription to increase bifocal strength Baseline vision: Macular Degeneration Visual history: macular degeneration  VISION ASSESSMENT: To be further assessed in functional context  Left sided Awareness    PERCEPTION: Limited left sided awareness   TODAY'S TREATMENT:   Neuromuscular re-education:  Pt. worked on grasping, flipping, turning, and stacking [place]  style discs. Pt. required visual demonstration, and cues for movement patterns. Pt. worked on speed, and Engineer, agricultural.  Pt. worked with 4 columns of 15 discs. Pt. Worked on removing the discs by holding, and storing the discs in palm of her hand, followed by translatory skills moving them from the palm of her hand to the tip of her digits in preparation for placing them into the case.  Pt. reports that her shoulder has been feeling better. Pt. required consistent verbal cues, and cues for visual demonstration of proper movement pattern while holding each disc with her 3rd digit, and thumb while turning them with her 2nd digit. Pt. worked on modulating the amount of pressure needed for holding the disc while turning them with her 2nd digit. Pt. Continues to improve left hand function in order to improve, and maximize independence with ADLs, and IADL tasks.     PATIENT EDUCATION: Education details: Kitchen safety/use of external memory aids to turn off burners, limit distractions when in kitchen Person educated: Patient Education method: verbal cues Education comprehension: verbalized understanding and pt does not plan to cook unless son is present.   HOME EXERCISE PROGRAM:  Continue with an ongoing assessment of HEP needs    GOALS: Goals reviewed with patient? Yes   SHORT TERM GOALS: Target date:  04/15/2022      Pt. Will improve FOTO score by 2 points to reflect improved pt. perceived functional performance Baseline: FOTO: 57, TR score: 62 10th visit: FOTO: 65, TR score: 62 Goal status: Achieved  LONG TERM GOALS: Target date: 05/27/2022    Pt. Will improve left UE strength by 2 mm grades to assist with repositioning her grandson on her lap. Baseline: Eval: left shoulder flexion: 3-/5, abduction 3-/5,  elbow flex/ext. 4-/5, wrist extension 3/5, wrist flexion 3-/5  10th visit: left shoulder flexion: 3+/5, abduction 3-+5,  elbow flex/ext. 4+/5, wrist extension 4/5, wrist flexion  34/5  Goal status: Ongoing  2.  Pt. will improve left grip strength by 5# to be able to independently hold and use a blow dryer, and brush for hair care. Baseline: Eval: Left grip strength 13#. Pt. Has difficulty performing hair care, holding and using a brush, and blow dryer. 10th visit: Left grip 14#. Pt. Is now able to hold th e blow dryer for the sides, and top of head, not the back. Goal status: Ongoing  3.  Pt. Will improve left lateral pinch strength by 3# to be able to independently cut meat. Baseline: Eval: Left 8, Pt. Has difficulty cutting meat. 10th: Left: 10#.  Pt. Is able to  cut tender meat, however is not as efficient Goal status: Ongoing  4.  Pt. Will improve Left hand [facility] skills to be able to be able to independently, and efficiently manipulate small objects during ADL tasks. Baseline: Eval: left: 1 min. 9 sec. 10th visit: Left: 1 min. Pt. Is improving with manipulating small objects.  Goal status: Ongoing   5.  Pt. Will perform light meal preparation with modified independence Baseline: Eval: Pt. Reports having difficulty performing light  meal preparation, and cooking tasks. 10th visit: Pt. Is able to make toast in the morning, a sandwich,  use the microwave, make coffee, and pour herself a drink.  Goal status: Ongoing.   ASSESSMENT:  CLINICAL IMPRESSION:  Pt. reports that her shoulder has been feeling better. Pt. required consistent verbal cues, and cues for visual demonstration of proper movement pattern while holding each disc with her 3rd digit, and thumb while turning them with her 2nd digit. Pt. worked on modulating the amount of pressure needed for holding the disc while turning them with her 2nd digit. Pt. Continues to improve left hand function in order to improve, and maximize independence with ADLs, and IADL tasks.   IMPAIRMENTS are limiting patient from ADLs, IADLs, and social participation.   COMORBIDITIES may have co-morbidities  that affects occupational  performance. Patient will benefit from skilled OT to address above impairments and improve overall function.  MODIFICATION OR ASSISTANCE TO COMPLETE EVALUATION: Min-Moderate modification of tasks or assist with assess necessary to complete an evaluation.  OT OCCUPATIONAL PROFILE AND HISTORY: Detailed assessment: Review of records and additional review of physical, cognitive, psychosocial history related to current functional performance.  CLINICAL DECISION MAKING: Moderate - several treatment options, min-mod task modification necessary  REHAB POTENTIAL: Good  EVALUATION COMPLEXITY: Moderate    PLAN: OT FREQUENCY: 2x/week  OT DURATION: 12 weeks  PLANNED INTERVENTIONS: self care/ADL training, therapeutic exercise, therapeutic activity, neuromuscular re-education, manual therapy, passive range of motion, and paraffin  RECOMMENDED OTHER SERVICES: ST, and PT  CONSULTED AND AGREED WITH PLAN OF CARE: Patient  PLAN FOR NEXT SESSION:  L hand strengthening and coordination exercises  Harrel Carina, MS, OTR/L   Harrel Carina, MS, OTR/L

## 2022-05-23 ENCOUNTER — Ambulatory Visit: Payer: Medicare HMO | Admitting: Speech Pathology

## 2022-05-23 ENCOUNTER — Ambulatory Visit: Payer: Medicare HMO

## 2022-05-23 DIAGNOSIS — I63511 Cerebral infarction due to unspecified occlusion or stenosis of right middle cerebral artery: Secondary | ICD-10-CM

## 2022-05-23 DIAGNOSIS — R2681 Unsteadiness on feet: Secondary | ICD-10-CM

## 2022-05-23 DIAGNOSIS — R262 Difficulty in walking, not elsewhere classified: Secondary | ICD-10-CM

## 2022-05-23 DIAGNOSIS — R278 Other lack of coordination: Secondary | ICD-10-CM

## 2022-05-23 DIAGNOSIS — M6281 Muscle weakness (generalized): Secondary | ICD-10-CM

## 2022-05-23 DIAGNOSIS — R41841 Cognitive communication deficit: Secondary | ICD-10-CM

## 2022-05-23 NOTE — Therapy (Signed)
OUTPATIENT PHYSICAL THERAPY NEURO TREATMENT    Patient Name: Alyssa Mayo MRN: [id number] DOB:05-24-1946, 76 y.o., female Today's Date: 05/23/2022   PCP: [place] Pink, MD REFERRING PROVIDER: Frann Rider, NP   PT End of Session - 05/23/22 0946     Visit Number 11    Number of Visits 25    Date for PT Re-Evaluation 06/25/22    Authorization Type [employer]    Authorization Time Period 04/02/2022-06/25/2022    Progress Note Due on Visit 20    PT Start Time 0943    PT Stop Time 1015    PT Time Calculation (min) 32 min    Equipment Utilized During Treatment Gait belt    Activity Tolerance Patient tolerated treatment well    Behavior During Therapy [facility] for tasks assessed/performed                Past Medical History:  Diagnosis Date   Abnormal levels of other serum enzymes 07/17/2015   Arthritis    Constipation 07/11/2015   COVID-19 03/06/2021   Diabetes mellitus without complication ([place])    Elevated liver enzymes    Fatty liver    Generalized abdominal pain 07/11/2015   Hyperlipidemia    Hypertension    Lifelong obesity    Macular degeneration    Morbid obesity due to excess calories ([place]) 07/11/2015   Other fatigue 07/11/2015   Sleep apnea    Spinal headache    Past Surgical History:  Procedure Laterality Date   ABDOMINAL HYSTERECTOMY     APPENDECTOMY     CATARACT EXTRACTION     CERVICAL LAMINECTOMY  07/21/1985   CESAREAN SECTION     COLONOSCOPY  07/21/2012   diverticulosis, [facility] Dr. Donnella Sham    COLONOSCOPY WITH PROPOFOL N/A 02/04/2019   Procedure: COLONOSCOPY WITH PROPOFOL;  Surgeon: Lollie Sails, MD;  Location: [facility] ENDOSCOPY;  Service: Endoscopy;  Laterality: N/A;   COLONOSCOPY WITH PROPOFOL N/A 03/18/2021   Procedure: COLONOSCOPY WITH PROPOFOL;  Surgeon: Lesly Rubenstein, MD;  Location: [facility] ENDOSCOPY;  Service: Endoscopy;  Laterality: N/A;  DM   DIAGNOSTIC LAPAROSCOPY     ESOPHAGOGASTRODUODENOSCOPY (EGD) WITH PROPOFOL N/A  02/04/2019   Procedure: ESOPHAGOGASTRODUODENOSCOPY (EGD) WITH PROPOFOL;  Surgeon: Lollie Sails, MD;  Location: [facility] ENDOSCOPY;  Service: Endoscopy;  Laterality: N/A;   ESOPHAGOGASTRODUODENOSCOPY (EGD) WITH PROPOFOL N/A 03/18/2021   Procedure: ESOPHAGOGASTRODUODENOSCOPY (EGD) WITH PROPOFOL;  Surgeon: Lesly Rubenstein, MD;  Location: [facility] ENDOSCOPY;  Service: Endoscopy;  Laterality: N/A;   LOOP RECORDER INSERTION N/A 08/05/2021   Procedure: LOOP RECORDER INSERTION;  Surgeon: Evans Lance, MD;  Location: [place] CV LAB;  Service: Cardiovascular;  Laterality: N/A;   [place]     TUBAL LIGATION     Patient Active Problem List   Diagnosis Date Noted   Cerebral edema ([place]) 08/07/2021   OSA (obstructive sleep apnea) 08/07/2021   Right middle cerebral artery stroke ([place]) 08/07/2021   CVA (cerebral vascular accident) ([place]) 08/01/2021   GERD (gastroesophageal reflux disease) 07/20/2019   Advanced care planning/counseling discussion 12/17/2016   Skin lesions, generalized 11/13/2016   Chronic right hip pain 06/17/2016   Vitamin D deficiency 09/26/2015   Diastasis recti 08/08/2015   Elevated serum GGT level 07/24/2015   Essential hypertension 07/17/2015   Fatty liver 07/17/2015   Elevated alkaline phosphatase level 07/17/2015   Elevated serum glutamic pyruvic transaminase (SGPT) level 07/17/2015   Abnormal finding on EKG 07/11/2015   Morbid obesity ([place]) 07/11/2015   Colon, diverticulosis  07/11/2015   Abdominal wall hernia 07/11/2015   DM (diabetes mellitus), type 2 with neurological complications [facility])    Hyperlipidemia     ONSET DATE: January 11th 2023  REFERRING DIAG:  Y50.354,S56.8 (ICD-10-CM) - Gait disturbance, post-stroke  I63.511 (ICD-10-CM) - Right middle cerebral artery stroke (HCC)    THERAPY DIAG:  Difficulty in walking, not elsewhere classified  Muscle weakness (generalized)  Unsteadiness on feet  Other lack of coordination  Rationale for Evaluation  and Treatment Rehabilitation  SUBJECTIVE:                                                                                                                                                                                              SUBJECTIVE STATEMENT:  Pt reports she did not sleep well last night.  She reports that when she woke up her L shoulder was hurting really bad and she couldn't get comfortable.     Pt accompanied by: self  PERTINENT HISTORY: Per chart and confirmed by pt:  Pt is a 76 yo female with RMCA CVA infarction with hemorrhagic transformation 08/01/2021, with inpatient rehab 08/07/2021-08/22/2021 followed by [facility] therapy until June. Pt now currently being seen for outpatient OT and ST services. Pt reports limitations in stamina persist. Other PMH is significant for HTN, HLD, macular degeneration, DM, obesity, sleep apnea, arthritis, chronic R hip pain, vitamin D deficiency, diastasis recti, abdominal wall hernia, hx of abdominal hysterectomy, appendectomy, spine surgery (years ago).   PAIN:  Are you having pain? No Pain Location:    TODAY'S TREATMENT:   TherEx:  3# weights donned  Seated march 2x15 each LE Seated LAQ 2x15 each LE  Standing squats to chair with airex pad placed on top, 2x10 Seated hip abduction with BTB placed at distal thigh, 2x15  Neuro:  Ambulation in hallway with cross body ball tosses with therapist, x4 length of hallway Standing cross body reaches for cones while standing on Airex pad, 4 cones each side, x2 each direction  PATIENT EDUCATION: Education details: exam findings, indications, plan, HEP Person educated: Patient Education method: Consulting civil engineer, Demonstration, Verbal cues, and Handouts Education comprehension: verbalized understanding, returned demonstration, verbal cues required, and needs further education   HOME EXERCISE PROGRAM: 05/01/22: Pt and son educated to perform NBOS cross body reaching at home, at [employer]  Access  Code: [email or web address] URL: [email or web address] Date: 04/02/2022 Prepared by: Ricard Dillon  Exercises - Seated March  - 1 x daily - 5 x weekly - 3 sets - 15 reps - Standing Single Leg Stance with Counter Support  - 1 x daily - 7 x weekly - 2 sets -  2 reps - 30 seconds hold    GOALS: Goals reviewed with patient? Yes  SHORT TERM GOALS: Target date: 07/04/2022  Patient will be independent in home exercise program to improve strength/mobility for better functional independence with ADLs. Baseline: initiated Goal status: INITIAL   LONG TERM GOALS: Target date: 08/15/2022  Patient will increase FOTO score to equal to or greater than 65  to demonstrate improvement in mobility and quality of life.  Baseline: 57 Goal status: INITIAL  2.  Patient (> 95 years old) will complete five times sit to stand test in < 15 seconds indicating an increased LE strength and improved balance. Baseline: 17 sec hands-free; 11/1: 15.36 sec Goal status: IN-PROGRESS  3.  Patient will increase 10 meter walk test to >1.108ms as to improve gait speed for better community ambulation and to reduce fall risk. Baseline: 0.66 m/s; 11/1:  0.85 m/s Goal status: IN-PROGRESS  4.  Patient will increase Berg Balance score by > 3 points to demonstrate decreased fall risk during functional activities. Baseline: 50/56 Goal status: INITIAL  5.  Patient will increase six minute walk test distance to >1000 for progression to community ambulator and improve gait ability Baseline: 04/10/22: 677f(pain onset at 5 minutes); 05/21/22: 1027 ft Goal status: MET    ASSESSMENT:  CLINICAL IMPRESSION:  Pt arrived to the clinic late today, so treatment session limited due to time constraints.  Pt able to tolerate therex to improved LE strength and neuromuscular re-education in order to improve balance necessary for tasks at home.  Pt really appreciated the ball tosses as it challenges her peripheral vision and she had  difficulty with that during her driving exam.  Pt would benefit from continued balance training and any other activity that may challenge her visual field for improving her driving capabilities.     OBJECTIVE IMPAIRMENTS Abnormal gait, decreased activity tolerance, decreased balance, decreased coordination, decreased endurance, decreased mobility, difficulty walking, decreased strength, impaired sensation, impaired UE functional use, improper body mechanics, postural dysfunction, and pain.   ACTIVITY LIMITATIONS carrying, lifting, bending, standing, squatting, stairs, bed mobility, bathing, reach over head, hygiene/grooming, and locomotion level  PARTICIPATION LIMITATIONS: meal prep, cleaning, laundry, medication management, personal finances, driving, shopping, community activity, and yard work  PERSONAL FACTORS Age, Sex, Time since onset of injury/illness/exacerbation, and 3+ comorbidities: Other PMH is significant for HTN, HLD, macular degeneration, DM, obesity, sleep apnea, arthritis, chronic R hip pain, vitamin D deficiency, diastasis recti, abdominal wall hernia, hx of abdominal hysterectomy, appendectomy, spine surgery (years ago).   are also affecting patient's functional outcome.   REHAB POTENTIAL: Good  CLINICAL DECISION MAKING: Evolving/moderate complexity  EVALUATION COMPLEXITY: Moderate  PLAN: PT FREQUENCY: 2x/week  PT DURATION: 12 weeks  PLANNED INTERVENTIONS: Therapeutic exercises, Therapeutic activity, Neuromuscular re-education, Balance training, Gait training, Patient/Family education, Self Care, Joint mobilization, Joint manipulation, Stair training, Vestibular training, Canalith repositioning, Orthotic/Fit training, DME instructions, Dry Needling, Electrical stimulation, Wheelchair mobility training, Spinal mobilization, Cryotherapy, Moist heat, Splintting, Taping, Traction, Ultrasound, Biofeedback, Manual therapy, and Re-evaluation  PLAN FOR NEXT SESSION:   multidirectional reaching coupled with cognitive/balance, strength, balance, gait, challenge peripheral vision   JoGwenlyn SaranPT, DPT Physical Therapist- [facility]11/03/23, 12:20 PM

## 2022-05-24 NOTE — Therapy (Signed)
OCCUPATIONAL THERAPY TREATMENT  Patient Name: Alyssa Mayo MRN: [id number] DOB:05/01/1946, 76 y.o., female Today's Date: 03/14/2022  PCP: [place] Pink, MD REFERRING PROVIDER: Frann Rider, NP   OT End of Session - 05/24/22 1935     Visit Number 15    Number of Visits 24    Date for OT Re-Evaluation 05/27/22    Authorization Type Progress reporting period starting 03/04/2022    OT Start Time 1015    OT Stop Time 1100    OT Time Calculation (min) 45 min    Activity Tolerance Patient tolerated treatment well    Behavior During Therapy [facility] for tasks assessed/performed             Past Medical History:  Diagnosis Date   Abnormal levels of other serum enzymes 07/17/2015   Arthritis    Constipation 07/11/2015   COVID-19 03/06/2021   Diabetes mellitus without complication ([place])    Elevated liver enzymes    Fatty liver    Generalized abdominal pain 07/11/2015   Hyperlipidemia    Hypertension    Lifelong obesity    Macular degeneration    Morbid obesity due to excess calories ([employer]) 07/11/2015   Other fatigue 07/11/2015   Sleep apnea    Spinal headache    Past Surgical History:  Procedure Laterality Date   ABDOMINAL HYSTERECTOMY     APPENDECTOMY     CATARACT EXTRACTION     CERVICAL LAMINECTOMY  07/21/1985   CESAREAN SECTION     COLONOSCOPY  07/21/2012   diverticulosis, [facility] Dr. Donnella Sham    COLONOSCOPY WITH PROPOFOL N/A 02/04/2019   Procedure: COLONOSCOPY WITH PROPOFOL;  Surgeon: Lollie Sails, MD;  Location: [facility] ENDOSCOPY;  Service: Endoscopy;  Laterality: N/A;   COLONOSCOPY WITH PROPOFOL N/A 03/18/2021   Procedure: COLONOSCOPY WITH PROPOFOL;  Surgeon: Lesly Rubenstein, MD;  Location: [facility] ENDOSCOPY;  Service: Endoscopy;  Laterality: N/A;  DM   DIAGNOSTIC LAPAROSCOPY     ESOPHAGOGASTRODUODENOSCOPY (EGD) WITH PROPOFOL N/A 02/04/2019   Procedure: ESOPHAGOGASTRODUODENOSCOPY (EGD) WITH PROPOFOL;  Surgeon: Lollie Sails, MD;  Location: [facility]  ENDOSCOPY;  Service: Endoscopy;  Laterality: N/A;   ESOPHAGOGASTRODUODENOSCOPY (EGD) WITH PROPOFOL N/A 03/18/2021   Procedure: ESOPHAGOGASTRODUODENOSCOPY (EGD) WITH PROPOFOL;  Surgeon: Lesly Rubenstein, MD;  Location: [facility] ENDOSCOPY;  Service: Endoscopy;  Laterality: N/A;   LOOP RECORDER INSERTION N/A 08/05/2021   Procedure: LOOP RECORDER INSERTION;  Surgeon: Evans Lance, MD;  Location: [place] CV LAB;  Service: Cardiovascular;  Laterality: N/A;   SPINE SURGERY     TUBAL LIGATION     Patient Active Problem List   Diagnosis Date Noted   Cerebral edema ([place]) 08/07/2021   OSA (obstructive sleep apnea) 08/07/2021   Right middle cerebral artery stroke ([place]) 08/07/2021   CVA (cerebral vascular accident) ([place]) 08/01/2021   GERD (gastroesophageal reflux disease) 07/20/2019   Advanced care planning/counseling discussion 12/17/2016   Skin lesions, generalized 11/13/2016   Chronic right hip pain 06/17/2016   Vitamin D deficiency 09/26/2015   Diastasis recti 08/08/2015   Elevated serum GGT level 07/24/2015   Essential hypertension 07/17/2015   Fatty liver 07/17/2015   Elevated alkaline phosphatase level 07/17/2015   Elevated serum glutamic pyruvic transaminase (SGPT) level 07/17/2015   Abnormal finding on EKG 07/11/2015   Morbid obesity ([place]) 07/11/2015   Colon, diverticulosis 07/11/2015   Abdominal wall hernia 07/11/2015   DM (diabetes mellitus), type 2 with neurological complications ([place])    Hyperlipidemia  ONSET DATE: 08/01/2021  REFERRING DIAG: CVA  THERAPY DIAG:  Muscle weakness (generalized)  Other lack of coordination  Right middle cerebral artery stroke [facility])  Rationale for Evaluation and Treatment Rehabilitation  SUBJECTIVE:   SUBJECTIVE STATEMENT:   Pt verbalized that she still drops things when trying to carry them in her L hand.  Pt accompanied by: self  PERTINENT HISTORY:  Pt. Is a 76 y.o. female  presents with a diagnosis of RMCA CVA infarction with  hemorrhagic transformation. Pt. Attended inpatient rehab from 08/07/2021-08/22/2021. Pt. received Home health therapy services this past spring.  Pt. Has recently had an assessment through driver rehabilitation services at [facility] with recommendations for referrals for  outpatient OT/PT, and ST services. Pt. PMHx includes: HTN, Hyperlipidemia, Macular degeneration, DM, and Obesity.  PRECAUTIONS: None  WEIGHT BEARING RESTRICTIONS No  PAIN:  Are you having pain? No.    FALLS: Has patient fallen in last 6 months? Yes.  LIVING ENVIRONMENT: Lives with: lives with their family  Son  Alyssa Mayo Lives in: House/apartment Main living are on one floor Stairs:  2 steps to enter Has following equipment at home: Single point cane, Wheelchair (manual), shower chair, Grab bars, bed rail, and rubber mat.  PLOF: Independent  PATIENT GOALS: To be able to Drive, and cook again  OBJECTIVE:   HAND DOMINANCE: Right   ADLs: Overall ADLs:  Transfers/ambulation related to ADLs: Independent Eating: Independent Grooming: Pt. Fatigues with sustained BUE's in elevation for haircare. Especially with blow drying hair. UB Dressing: Independent pullover shirt, independent now with fastening a bra LB Dressing: Independent donning pants socks, and slide on shoes Toileting: Independent Bathing: Independent Tub Shower transfers: Walk-in shower Independent now   IADLs: Shopping: Needs to be accompanied to the grocery store. Light housekeeping: Does own laundry, laundry,  Meal Prep:  Difficulty with cooking Community mobility: Relies on son, and friend Medication management: Son sets up Building surveyor management: Manages monthly bills independently. Handwriting: No change from baseline  MOBILITY STATUS: Hx of falls out of bed   ACTIVITY TOLERANCE: Activity tolerance:  10-20 min. Before rest break  FUNCTIONAL OUTCOME MEASURES: FOTO: 57  TR score: 62  UPPER EXTREMITY ROM     Active ROM Right eval Left eval  Left 05/06/2022  Shoulder flexion WFL 96(110) 107(110)  Shoulder abduction WFL 82(105) 86(105)  Shoulder adduction     Shoulder extension     Shoulder internal rotation     Shoulder external rotation     Elbow flexion [facility] WFL  Elbow extension [facility] WFL  Wrist flexion WFL 44(60) 68(68)  Wrist extension WFL 32(60 66(66)  Wrist ulnar deviation     Wrist radial deviation     Wrist pronation     Wrist supination     (Blank rows = not tested)   UPPER EXTREMITY MMT:     MMT Right eval Left eval Left 05/06/2022  Shoulder flexion 4+/5 3-/5 3+/5  Shoulder abduction 4+/5 3-/5 3-/5  Shoulder adduction     Shoulder extension     Shoulder internal rotation     Shoulder external rotation     Middle trapezius     Lower trapezius     Elbow flexion 4+/5 4-/5 4+/5  Elbow extension 4+/5 4-/5 4+/5  Wrist flexion 4+/5 3-/5 4/5  Wrist extension 4+/5 3/5 4/5  Wrist ulnar deviation     Wrist radial deviation     Wrist pronation     Wrist supination     (Blank rows =  not tested)  HAND FUNCTION: Grip strength: Right: 30 lbs; Left: 13 lbs, Lateral pinch: Right: 15 lbs, Left: 8 lbs, and 3 point pinch: Right: 13 lbs, Left: 7 lbs  05/06/2022 Grip strength: Right: 30 lbs; Left: 14 lbs, Lateral pinch: Right: 15 lbs, Left: 10 lbs, and 3 point pinch: Right: 13 lbs, Left: 7 lbs   COORDINATION: 9 Hole Peg test: Right: 26  sec; Left: 1 min. & 9 sec  05/06/2022 9 Hole Peg test: Right: 26  sec; Left: 1 min.  SENSATION: Light touch: WFL Proprioception: WFL  COGNITION: Overall cognitive status: Within functional limits for tasks assessed  VISION: Subjective report: Glasses. Just received a new prescription to increase bifocal strength Baseline vision: Macular Degeneration Visual history: macular degeneration  VISION ASSESSMENT: To be further assessed in functional context  Left sided Awareness    PERCEPTION: Limited left sided awareness   TODAY'S TREATMENT:   Therapeutic  Exercise:  Facilitated L hand strengthening with use of hand gripper set at 6.6# to remove jumbo pegs from pegboard x3 trials using L hand.  Pt required min vc for   technique to minimize dropping pegs, requiring cues for timing to delay her release of pegs into container.  Pt required rest between each set.   Neuro re-ed: Facilitated L hand dexterity skills, with pt practicing placing jumbo pegs into pegboard, then rotating pegs 180 degrees, then 360 degrees within fingertips, and moving pegs from 1 row to another with each flip.  Pt requiring cues for use of fingertips to rotate pegs while minimizing forearm rotation.    Self Care:  Practiced reaching and transporting light items with L hand, including 1-3# weight, then progressed to paper cup 2/3 full of water using L hand while walking across the room and placing these items on varying table and countertop heights.  When carrying cup of water, pt required mod vc to look at water cup in hand to keep level, otherwise cup would tilt and nearly spill.  Educated pt on home carryover, practicing carrying light items in L hand that she would not have to worry about breaking or spilling if these items were dropped, and to practice looking at items in hand to compensate for decreased proprioception on the L.     PATIENT EDUCATION: Education details: Kitchen safety/use of external memory aids to turn off burners, limit distractions when in kitchen Person educated: Patient Education method: verbal cues Education comprehension: verbalized understanding and pt does not plan to cook unless son is present.   HOME EXERCISE PROGRAM:  Continue with an ongoing assessment of HEP needs    GOALS: Goals reviewed with patient? Yes   SHORT TERM GOALS: Target date:  04/15/2022      Pt. Will improve FOTO score by 2 points to reflect improved pt. perceived functional performance Baseline: FOTO: 57, TR score: 62 10th visit: FOTO: 65, TR score: 62 Goal  status: Achieved  LONG TERM GOALS: Target date: 05/27/2022    Pt. Will improve left UE strength by 2 mm grades to assist with repositioning her grandson on her lap. Baseline: Eval: left shoulder flexion: 3-/5, abduction 3-/5,  elbow flex/ext. 4-/5, wrist extension 3/5, wrist flexion 3-/5  10th visit: left shoulder flexion: 3+/5, abduction 3-+5,  elbow flex/ext. 4+/5, wrist extension 4/5, wrist flexion 34/5  Goal status: Ongoing  2.  Pt. will improve left grip strength by 5# to be able to independently hold and use a blow dryer, and brush for hair care. Baseline: Eval: Left  grip strength 13#. Pt. Has difficulty performing hair care, holding and using a brush, and blow dryer. 10th visit: Left grip 14#. Pt. Is now able to hold th e blow dryer for the sides, and top of head, not the back. Goal status: Ongoing  3.  Pt. Will improve left lateral pinch strength by 3# to be able to independently cut meat. Baseline: Eval: Left 8, Pt. Has difficulty cutting meat. 10th: Left: 10#.  Pt. Is able to cut tender meat, however is not as efficient Goal status: Ongoing  4.  Pt. Will improve Left hand [facility] skills to be able to be able to independently, and efficiently manipulate small objects during ADL tasks. Baseline: Eval: left: 1 min. 9 sec. 10th visit: Left: 1 min. Pt. Is improving with manipulating small objects.  Goal status: Ongoing   5.  Pt. Will perform light meal preparation with modified independence Baseline: Eval: Pt. Reports having difficulty performing light  meal preparation, and cooking tasks. 10th visit: Pt. Is able to make toast in the morning, a sandwich,  use the microwave, make coffee, and pour herself a drink.  Goal status: Ongoing.   ASSESSMENT:  CLINICAL IMPRESSION:  Pt reported that she still tends to drop or spill things at home when trying to carry things in her L hand.  Practiced reaching and transporting light items with L hand, including 1-3# weight, then progressed to paper  cup 2/3 full of water using L hand while walking across the room and placing these items on varying table and countertop heights.  When carrying cup of water, pt required mod vc to look at water cup in hand to keep level, otherwise cup would tilt and nearly spill.  Educated pt on home carryover, practicing carrying light items in L hand that she would not have to worry about breaking or spilling if these items were dropped, and to practice looking at items in hand to compensate for decreased proprioception on the L. Pt. continues to improve left hand function in order to improve, and maximize independence with ADLs, and IADL tasks.   IMPAIRMENTS are limiting patient from ADLs, IADLs, and social participation.   COMORBIDITIES may have co-morbidities  that affects occupational performance. Patient will benefit from skilled OT to address above impairments and improve overall function.  MODIFICATION OR ASSISTANCE TO COMPLETE EVALUATION: Min-Moderate modification of tasks or assist with assess necessary to complete an evaluation.  OT OCCUPATIONAL PROFILE AND HISTORY: Detailed assessment: Review of records and additional review of physical, cognitive, psychosocial history related to current functional performance.  CLINICAL DECISION MAKING: Moderate - several treatment options, min-mod task modification necessary  REHAB POTENTIAL: Good  EVALUATION COMPLEXITY: Moderate    PLAN: OT FREQUENCY: 2x/week  OT DURATION: 12 weeks  PLANNED INTERVENTIONS: self care/ADL training, therapeutic exercise, therapeutic activity, neuromuscular re-education, manual therapy, passive range of motion, and paraffin  RECOMMENDED OTHER SERVICES: ST, and PT  CONSULTED AND AGREED WITH PLAN OF CARE: Patient  PLAN FOR NEXT SESSION:  L hand strengthening and coordination exercises   Leta Speller, MS, OTR/L

## 2022-05-26 NOTE — Therapy (Addendum)
OUTPATIENT SPEECH LANGUAGE PATHOLOGY TREATMENT NOTE   Patient Name: Alyssa Mayo MRN: [id number] DOB:1945/11/03, 76 y.o., female Today's Date: 05/23/2022  PCP: [place] Pink, MD REFERRING PROVIDER: Frann Rider, NP  END OF SESSION:   End of Session - 05/23/2022 1718     Visit Number 13   Number of Visits 25    Date for SLP Re-Evaluation 05/27/22    Authorization Type [employer] HMO/PPO    Progress Note Due on Visit 20    SLP Start Time 1100    SLP Stop Time  1200    SLP Time Calculation (min) 60 min    Activity Tolerance Patient tolerated treatment well             Past Medical History:  Diagnosis Date   Abnormal levels of other serum enzymes 07/17/2015   Arthritis    Constipation 07/11/2015   COVID-19 03/06/2021   Diabetes mellitus without complication ([place])    Elevated liver enzymes    Fatty liver    Generalized abdominal pain 07/11/2015   Hyperlipidemia    Hypertension    Lifelong obesity    Macular degeneration    Morbid obesity due to excess calories ([place]) 07/11/2015   Other fatigue 07/11/2015   Sleep apnea    Spinal headache    Past Surgical History:  Procedure Laterality Date   ABDOMINAL HYSTERECTOMY     APPENDECTOMY     CATARACT EXTRACTION     CERVICAL LAMINECTOMY  07/21/1985   CESAREAN SECTION     COLONOSCOPY  07/21/2012   diverticulosis, [facility] Dr. Donnella Sham    COLONOSCOPY WITH PROPOFOL N/A 02/04/2019   Procedure: COLONOSCOPY WITH PROPOFOL;  Surgeon: Lollie Sails, MD;  Location: [facility] ENDOSCOPY;  Service: Endoscopy;  Laterality: N/A;   COLONOSCOPY WITH PROPOFOL N/A 03/18/2021   Procedure: COLONOSCOPY WITH PROPOFOL;  Surgeon: Lesly Rubenstein, MD;  Location: [facility] ENDOSCOPY;  Service: Endoscopy;  Laterality: N/A;  DM   DIAGNOSTIC LAPAROSCOPY     ESOPHAGOGASTRODUODENOSCOPY (EGD) WITH PROPOFOL N/A 02/04/2019   Procedure: ESOPHAGOGASTRODUODENOSCOPY (EGD) WITH PROPOFOL;  Surgeon: Lollie Sails, MD;  Location: [facility] ENDOSCOPY;   Service: Endoscopy;  Laterality: N/A;   ESOPHAGOGASTRODUODENOSCOPY (EGD) WITH PROPOFOL N/A 03/18/2021   Procedure: ESOPHAGOGASTRODUODENOSCOPY (EGD) WITH PROPOFOL;  Surgeon: Lesly Rubenstein, MD;  Location: [facility] ENDOSCOPY;  Service: Endoscopy;  Laterality: N/A;   LOOP RECORDER INSERTION N/A 08/05/2021   Procedure: LOOP RECORDER INSERTION;  Surgeon: Evans Lance, MD;  Location: [place] CV LAB;  Service: Cardiovascular;  Laterality: N/A;   SPINE SURGERY     TUBAL LIGATION     Patient Active Problem List   Diagnosis Date Noted   Cerebral edema ([place]) 08/07/2021   OSA (obstructive sleep apnea) 08/07/2021   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/07/2021   CVA (cerebral vascular accident) ([place]) 08/01/2021   GERD (gastroesophageal reflux disease) 07/20/2019   Advanced care planning/counseling discussion 12/17/2016   Skin lesions, generalized 11/13/2016   Chronic right hip pain 06/17/2016   Vitamin D deficiency 09/26/2015   Diastasis recti 08/08/2015   Elevated serum GGT level 07/24/2015   Essential hypertension 07/17/2015   Fatty liver 07/17/2015   Elevated alkaline phosphatase level 07/17/2015   Elevated serum glutamic pyruvic transaminase (SGPT) level 07/17/2015   Abnormal finding on EKG 07/11/2015   Morbid obesity ([place]) 07/11/2015   Colon, diverticulosis 07/11/2015   Abdominal wall hernia 07/11/2015   DM (diabetes mellitus), type 2 with neurological complications ([place])    Hyperlipidemia  ONSET DATE: 08/01/2021 CVA; 02/26/2022 date of referral   REFERRING DIAG: 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 is a 76 y.o. female with history of diabetes, hypertension, hyperlipidemia, obesity, and sleep apnea who presented to [facility] ED on 08/01/2021 after being found down by her son. Her last know well was approximately 07/29/2021. Pt transferred to [facility] for  further CVA management. In addition, pt received ST services in CIR from 08/08/2021 thru 08/21/2021.      DIAGNOSTIC FINDINGS: MRI showed extensive restricted diffusion throughout much of the right MCA territory with associated changes of hemorrhagic transformation and localized cerebral edema and mass effect. Per Dr. Leonie Man, felt embolic pattern secondary to unclear source although concerning for occult A fib. Repeat [facility] 1/14 showed no interval progression of right MCA hemorrhagic infarct with petechial hemorrhage.  CTA head/neck showed right M2 thrombus.  EF 60 to 65%.   THERAPY DIAG:  Cognitive communication deficit  Right middle cerebral artery stroke [facility])  Rationale for Evaluation and Treatment Rehabilitation  SUBJECTIVE: pt pleasant, eager  Pt accompanied by: family member  PAIN:  Are you having pain? No  PATIENT GOALS to be able to drive again and live independently (she currently lives alone but her son plans on moving in with her)  OBJECTIVE:   TODAY'S TREATMENT: Skilled treatment session focused on pt's cognitive communication goals. SLP facilitated the session by providing the following interventions:  Basic seasonal word scramble - moderate faded to minimal assistance for task organization, sequence and fast rate of completion but decreased accuracy.    PATIENT EDUCATION: Education details: Animator; directional awareness for navigating the hallways Person educated: Patient and Child(ren) Education method: Explanation, Demonstration, Verbal cues, and Handouts Education comprehension: needs further education  GOALS: Goals reviewed with patient? Yes  REPORTING PERIOD 03/04/2022 to 05/13/2022 SHORT TERM GOALS: Target date: 10 sessions   With moderate assistance, pt will recall her medicines with 80% accuracy.  Baseline: currently unable Goal status: MET; 91% recall independently   2.  With minimal assistance, pt will complete basic medication  management task with > 90% accuracy.  Baseline: total Goal status: ONGOING; currently requiring moderate assistance    3.  Pt will demonstrate selective attention to basic task for 15 minutes with < 2 redirections.  Baseline: moderate assistance Goal status: ONGOING; currently requiring minimal assistance   4.  Pt will complete basic bill paying task with 75% accuracy.  Baseline: total Goal status: ONGOING; currently at 50% with moderate assistance  5. With minimal assistance, pt will use strategies to complete semi-complex visuospatial tasks with 85% accuracy.   Baseline: moderate  Goal status: INITIAL  6. Pt will require < 2 cues to initiate leaving session in 5 out of 6 opportunities.   Baseline: 6 cues  Goal status: INITIAL     LONG TERM GOALS: Target date: 05/27/2022 With rare min A, pt will organize her medicines accurately in a pill organizer.  Baseline: Total Goal status: ONGOING   2.  With rare min A, pt will demonstrate selective attention to task for 30 minutes.  Baseline: moderate Goal status: ONGOING   3.  With rare min A, pt will sequence basic meal prep activity.  Baseline: Moderate Goal status: ONGOING      ASSESSMENT:  CLINICAL IMPRESSION: While pt continues to present with moderate cognitive communication impairments, she presents with improved ability to utilize compensatory problem solving  strategies to achieve improved accuracy with medication management. Her acute deficits include increased distractibility, decreased task initiation, self-awareness of deficits and possible visual perceptual deficits as well as recall of directional navigation.    OBJECTIVE IMPAIRMENTS include attention and executive functioning. These impairments are limiting patient from managing medications, managing appointments, managing finances, household responsibilities, and ADLs/IADLs. Factors affecting potential to achieve goals and functional outcome are ability to  learn/carryover information. Patient will benefit from skilled SLP services to address above impairments and improve overall function.  REHAB POTENTIAL: Good  PLAN: SLP FREQUENCY: 1-2x/week  SLP DURATION: 12 weeks  PLANNED INTERVENTIONS: Functional tasks, SLP instruction and feedback, and Patient/family education   Hong Timm B. Rutherford Nail, M.S., CCC-SLP, Mining engineer Certified Brain Injury [place]  [place] Office [phone] Ascom [phone] Fax [phone]

## 2022-05-26 NOTE — Therapy (Addendum)
OUTPATIENT SPEECH LANGUAGE PATHOLOGY TREATMENT NOTE   Patient Name: Alyssa Mayo MRN: [id number] DOB:17-Jul-1946, 76 y.o., female Today's Date: 05/20/2022   PCP: [place] Pink, MD REFERRING PROVIDER: Frann Rider, NP  END OF SESSION:   End of Session - 05/20/22 1718     Visit Number 12   Number of Visits 25    Date for SLP Re-Evaluation 05/27/22    Authorization Type [employer] HMO/PPO    Progress Note Due on Visit 20    SLP Start Time 1100    SLP Stop Time  1200    SLP Time Calculation (min) 60 min    Activity Tolerance Patient tolerated treatment well             Past Medical History:  Diagnosis Date   Abnormal levels of other serum enzymes 07/17/2015   Arthritis    Constipation 07/11/2015   COVID-19 03/06/2021   Diabetes mellitus without complication ([place])    Elevated liver enzymes    Fatty liver    Generalized abdominal pain 07/11/2015   Hyperlipidemia    Hypertension    Lifelong obesity    Macular degeneration    Morbid obesity due to excess calories ([place]) 07/11/2015   Other fatigue 07/11/2015   Sleep apnea    Spinal headache    Past Surgical History:  Procedure Laterality Date   ABDOMINAL HYSTERECTOMY     APPENDECTOMY     CATARACT EXTRACTION     CERVICAL LAMINECTOMY  07/21/1985   CESAREAN SECTION     COLONOSCOPY  07/21/2012   diverticulosis, [facility] Dr. Donnella Sham    COLONOSCOPY WITH PROPOFOL N/A 02/04/2019   Procedure: COLONOSCOPY WITH PROPOFOL;  Surgeon: Lollie Sails, MD;  Location: [facility] ENDOSCOPY;  Service: Endoscopy;  Laterality: N/A;   COLONOSCOPY WITH PROPOFOL N/A 03/18/2021   Procedure: COLONOSCOPY WITH PROPOFOL;  Surgeon: Lesly Rubenstein, MD;  Location: [facility] ENDOSCOPY;  Service: Endoscopy;  Laterality: N/A;  DM   DIAGNOSTIC LAPAROSCOPY     ESOPHAGOGASTRODUODENOSCOPY (EGD) WITH PROPOFOL N/A 02/04/2019   Procedure: ESOPHAGOGASTRODUODENOSCOPY (EGD) WITH PROPOFOL;  Surgeon: Lollie Sails, MD;  Location: [facility] ENDOSCOPY;   Service: Endoscopy;  Laterality: N/A;   ESOPHAGOGASTRODUODENOSCOPY (EGD) WITH PROPOFOL N/A 03/18/2021   Procedure: ESOPHAGOGASTRODUODENOSCOPY (EGD) WITH PROPOFOL;  Surgeon: Lesly Rubenstein, MD;  Location: [facility] ENDOSCOPY;  Service: Endoscopy;  Laterality: N/A;   LOOP RECORDER INSERTION N/A 08/05/2021   Procedure: LOOP RECORDER INSERTION;  Surgeon: Evans Lance, MD;  Location: [place] CV LAB;  Service: Cardiovascular;  Laterality: N/A;   SPINE SURGERY     TUBAL LIGATION     Patient Active Problem List   Diagnosis Date Noted   Cerebral edema ([place]) 08/07/2021   OSA (obstructive sleep apnea) 08/07/2021   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   CVA (cerebral vascular accident) ([place]) 08/01/2021   GERD (gastroesophageal reflux disease) 07/20/2019   Advanced care planning/counseling discussion 12/17/2016   Skin lesions, generalized 11/13/2016   Chronic right hip pain 06/17/2016   Vitamin D deficiency 09/26/2015   Diastasis recti 08/08/2015   Elevated serum GGT level 07/24/2015   Essential hypertension 07/17/2015   Fatty liver 07/17/2015   Elevated alkaline phosphatase level 07/17/2015   Elevated serum glutamic pyruvic transaminase (SGPT) level 07/17/2015   Abnormal finding on EKG 07/11/2015   Morbid obesity ([place]) 07/11/2015   Colon, diverticulosis 07/11/2015   Abdominal wall hernia 07/11/2015   DM (diabetes mellitus), type 2 with neurological complications ([place])    Hyperlipidemia  ONSET DATE: 08/01/2021 CVA; 02/26/2022 date of referral   REFERRING DIAG: I69.319 (ICD-10-CM) - Cognitive deficit, post-stroke I63.511 (ICD-10-CM) - Right middle cerebral artery stroke [facility])     PERTINENT HISTORY: [name] is a 76 y.o. female with history of diabetes, hypertension, hyperlipidemia, obesity, and sleep apnea who presented to [facility] ED on 08/01/2021 after being found down by her son. Her last know well was approximately 07/29/2021. Pt transferred to [facility] for  further CVA management. In addition, pt received ST services in CIR from 08/08/2021 thru 08/21/2021.      DIAGNOSTIC FINDINGS: MRI showed extensive restricted diffusion throughout much of the right MCA territory with associated changes of hemorrhagic transformation and localized cerebral edema and mass effect. Per Dr. Leonie Man, felt embolic pattern secondary to unclear source although concerning for occult A fib. Repeat [facility] 1/14 showed no interval progression of right MCA hemorrhagic infarct with petechial hemorrhage.  CTA head/neck showed right M2 thrombus.  EF 60 to 65%.   THERAPY DIAG:  Cognitive communication deficit  Right middle cerebral artery stroke [facility])  Rationale for Evaluation and Treatment Rehabilitation  SUBJECTIVE: pt pleasant, eager  Pt accompanied by: family member  PAIN:  Are you having pain? No  PATIENT GOALS to be able to drive again and live independently (she currently lives alone but her son plans on moving in with her)  OBJECTIVE:   TODAY'S TREATMENT: Skilled treatment session focused on pt's cognitive communication goals. SLP facilitated the session by providing the following interventions:  Basic seasonal word scramble - moderate cues required to read directions; initiate request for helpful; organization of task with improvement noted in using blank space to arrange letters for problem solving - pt able to solve 3 of 7 independently improving to 7 out of 7 with moderate letter cues.    PATIENT EDUCATION: Education details: Animator; directional awareness for navigating the hallways Person educated: Patient and Child(ren) Education method: Explanation, Demonstration, Verbal cues, and Handouts Education comprehension: needs further education  GOALS: Goals reviewed with patient? Yes  REPORTING PERIOD 03/04/2022 to 05/13/2022 SHORT TERM GOALS: Target date: 10 sessions   With moderate assistance, pt will recall her medicines with 80% accuracy.   Baseline: currently unable Goal status: MET; 91% recall independently   2.  With minimal assistance, pt will complete basic medication management task with > 90% accuracy.  Baseline: total Goal status: ONGOING; currently requiring moderate assistance    3.  Pt will demonstrate selective attention to basic task for 15 minutes with < 2 redirections.  Baseline: moderate assistance Goal status: ONGOING; currently requiring minimal assistance   4.  Pt will complete basic bill paying task with 75% accuracy.  Baseline: total Goal status: ONGOING; currently at 50% with moderate assistance  5. With minimal assistance, pt will use strategies to complete semi-complex visuospatial tasks with 85% accuracy.   Baseline: moderate  Goal status: INITIAL  6. Pt will require < 2 cues to initiate leaving session in 5 out of 6 opportunities.   Baseline: 6 cues  Goal status: INITIAL     LONG TERM GOALS: Target date: 05/27/2022 With rare min A, pt will organize her medicines accurately in a pill organizer.  Baseline: Total Goal status: ONGOING   2.  With rare min A, pt will demonstrate selective attention to task for 30 minutes.  Baseline: moderate Goal status: ONGOING   3.  With rare min A, pt will sequence basic meal prep activity.  Baseline: Moderate Goal status: ONGOING  ASSESSMENT:  CLINICAL IMPRESSION: While pt continues to present with moderate cognitive communication impairments, she presents with improved ability to utilize compensatory problem solving strategies to achieve improved accuracy with medication management. Her acute deficits include increased distractibility, decreased task initiation, self-awareness of deficits and possible visual perceptual deficits as well as recall of directional navigation.    OBJECTIVE IMPAIRMENTS include attention and executive functioning. These impairments are limiting patient from managing medications, managing appointments, managing  finances, household responsibilities, and ADLs/IADLs. Factors affecting potential to achieve goals and functional outcome are ability to learn/carryover information. Patient will benefit from skilled SLP services to address above impairments and improve overall function.  REHAB POTENTIAL: Good  PLAN: SLP FREQUENCY: 1-2x/week  SLP DURATION: 12 weeks  PLANNED INTERVENTIONS: Functional tasks, SLP instruction and feedback, and Patient/family education   Najae Rathert B. Rutherford Nail, M.S., CCC-SLP, Mining engineer Certified Brain Injury [place]  [place] Office [phone] Ascom [phone] Fax [phone]

## 2022-05-27 ENCOUNTER — Ambulatory Visit: Payer: Medicare HMO | Admitting: Speech Pathology

## 2022-05-27 ENCOUNTER — Ambulatory Visit: Payer: Medicare HMO | Admitting: Occupational Therapy

## 2022-05-27 ENCOUNTER — Ambulatory Visit: Payer: Medicare HMO

## 2022-05-27 DIAGNOSIS — R41841 Cognitive communication deficit: Secondary | ICD-10-CM | POA: Diagnosis not present

## 2022-05-27 DIAGNOSIS — M6281 Muscle weakness (generalized): Secondary | ICD-10-CM

## 2022-05-27 DIAGNOSIS — R2681 Unsteadiness on feet: Secondary | ICD-10-CM

## 2022-05-27 DIAGNOSIS — I63511 Cerebral infarction due to unspecified occlusion or stenosis of right middle cerebral artery: Secondary | ICD-10-CM

## 2022-05-27 DIAGNOSIS — R278 Other lack of coordination: Secondary | ICD-10-CM

## 2022-05-27 DIAGNOSIS — R262 Difficulty in walking, not elsewhere classified: Secondary | ICD-10-CM

## 2022-05-27 NOTE — Therapy (Signed)
OCCUPATIONAL THERAPY TREATMENT/RECERTIFICATION NOTE  Patient Name: Alyssa Mayo MRN: [id number] DOB:[birth year], 76 y.o., female Today's Date: 03/14/2022  PCP: Maryland Pink, MD REFERRING PROVIDER: Frann Rider, NP   OT End of Session - 05/27/22 1058     Visit Number 16    Number of Visits 24    Date for OT Re-Evaluation 08/19/2022   Authorization Type Progress reporting period starting 03/04/2022    OT Start Time 1100    OT Stop Time 1145    OT Time Calculation (min) 45 min    Activity Tolerance Patient tolerated treatment well    Behavior During Therapy [facility] for tasks assessed/performed             Past Medical History:  Diagnosis Date   Abnormal levels of other serum enzymes 07/17/2015   Arthritis    Constipation 07/11/2015   COVID-19 03/06/2021   Diabetes mellitus without complication ([place])    Elevated liver enzymes    Fatty liver    Generalized abdominal pain 07/11/2015   Hyperlipidemia    Hypertension    Lifelong obesity    Macular degeneration    Morbid obesity due to excess calories ([place]) 07/11/2015   Other fatigue 07/11/2015   Sleep apnea    Spinal headache    Past Surgical History:  Procedure Laterality Date   ABDOMINAL HYSTERECTOMY     APPENDECTOMY     CATARACT EXTRACTION     CERVICAL LAMINECTOMY  07/21/1985   CESAREAN SECTION     COLONOSCOPY  07/21/2012   diverticulosis, [facility] Dr. Donnella Sham    COLONOSCOPY WITH PROPOFOL N/A 02/04/2019   Procedure: COLONOSCOPY WITH PROPOFOL;  Surgeon: Lollie Sails, MD;  Location: [facility] ENDOSCOPY;  Service: Endoscopy;  Laterality: N/A;   COLONOSCOPY WITH PROPOFOL N/A 03/18/2021   Procedure: COLONOSCOPY WITH PROPOFOL;  Surgeon: Lesly Rubenstein, MD;  Location: [facility] ENDOSCOPY;  Service: Endoscopy;  Laterality: N/A;  DM   DIAGNOSTIC LAPAROSCOPY     ESOPHAGOGASTRODUODENOSCOPY (EGD) WITH PROPOFOL N/A 02/04/2019   Procedure: ESOPHAGOGASTRODUODENOSCOPY (EGD) WITH PROPOFOL;  Surgeon: Lollie Sails,  MD;  Location: [facility] ENDOSCOPY;  Service: Endoscopy;  Laterality: N/A;   ESOPHAGOGASTRODUODENOSCOPY (EGD) WITH PROPOFOL N/A 03/18/2021   Procedure: ESOPHAGOGASTRODUODENOSCOPY (EGD) WITH PROPOFOL;  Surgeon: Lesly Rubenstein, MD;  Location: [facility] ENDOSCOPY;  Service: Endoscopy;  Laterality: N/A;   LOOP RECORDER INSERTION N/A 08/05/2021   Procedure: LOOP RECORDER INSERTION;  Surgeon: Evans Lance, MD;  Location: [place] CV LAB;  Service: Cardiovascular;  Laterality: N/A;   SPINE SURGERY     TUBAL LIGATION     Patient Active Problem List   Diagnosis Date Noted   Cerebral edema ([place]) 08/07/2021   OSA (obstructive sleep apnea) 08/07/2021   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   CVA (cerebral vascular accident) ([place]) 08/01/2021   GERD (gastroesophageal reflux disease) 07/20/2019   Advanced care planning/counseling discussion 12/17/2016   Skin lesions, generalized 11/13/2016   Chronic right hip pain 06/17/2016   Vitamin D deficiency 09/26/2015   Diastasis recti 08/08/2015   Elevated serum GGT level 07/24/2015   Essential hypertension 07/17/2015   Fatty liver 07/17/2015   Elevated alkaline phosphatase level 07/17/2015   Elevated serum glutamic pyruvic transaminase (SGPT) level 07/17/2015   Abnormal finding on EKG 07/11/2015   Morbid obesity ([place]) 07/11/2015   Colon, diverticulosis 07/11/2015   Abdominal wall hernia 07/11/2015   DM (diabetes mellitus), type 2 with neurological complications ([place])    Hyperlipidemia  ONSET DATE: 08/01/2021  REFERRING DIAG: CVA  THERAPY DIAG:  Muscle weakness (generalized)  Rationale for Evaluation and Treatment Rehabilitation  SUBJECTIVE:   SUBJECTIVE STATEMENT:   Pt. Reports that her son, and grandson are going away for the weekend.  Pt accompanied by: self  PERTINENT HISTORY:  Pt. Is a 76 y.o. female  presents with a diagnosis of RMCA CVA infarction with hemorrhagic transformation. Pt. Attended inpatient rehab from  08/07/2021-08/22/2021. Pt. received Home health therapy services this past spring.  Pt. Has recently had an assessment through driver rehabilitation services at [facility] with recommendations for referrals for  outpatient OT/PT, and ST services. Pt. PMHx includes: HTN, Hyperlipidemia, Macular degeneration, DM, and Obesity.  PRECAUTIONS: None  WEIGHT BEARING RESTRICTIONS No  PAIN:  Are you having pain? No.    FALLS: Has patient fallen in last 6 months? Yes.  LIVING ENVIRONMENT: Lives with: lives with their family  Son  [name] Lives in: House/apartment Main living are on one floor Stairs:  2 steps to enter Has following equipment at home: Single point cane, Wheelchair (manual), shower chair, Grab bars, bed rail, and rubber mat.  PLOF: Independent  PATIENT GOALS: To be able to Drive, and cook again  OBJECTIVE:   HAND DOMINANCE: Right   ADLs: Overall ADLs:  Transfers/ambulation related to ADLs: Independent Eating: Independent Grooming: Pt. Fatigues with sustained BUE's in elevation for haircare. Especially with blow drying hair. UB Dressing: Independent pullover shirt, independent now with fastening a bra LB Dressing: Independent donning pants socks, and slide on shoes Toileting: Independent Bathing: Independent Tub Shower transfers: Walk-in shower Independent now   IADLs: Shopping: Needs to be accompanied to the grocery store. Light housekeeping: Does own laundry, laundry,  Meal Prep:  Difficulty with cooking Community mobility: Relies on son, and friend Medication management: Son sets up Mining engineer management: Manages monthly bills independently. Handwriting: No change from baseline  MOBILITY STATUS: Hx of falls out of bed   ACTIVITY TOLERANCE: Activity tolerance:  10-20 min. Before rest break  FUNCTIONAL OUTCOME MEASURES: FOTO: 57  TR score: 62  UPPER EXTREMITY ROM     Active ROM Right eval Left eval Left 05/06/2022 Left 05/27/2022  Shoulder flexion WFL  96(110) 107(110) 109(120)  Shoulder abduction WFL 82(105) 86(105) 92(115)  Shoulder adduction      Shoulder extension      Shoulder internal rotation      Shoulder external rotation      Elbow flexion [facility] WFL  Elbow extension [facility] WFL  Wrist flexion WFL 44(60) 68(68) WFL  Wrist extension WFL 32(60) 66(66) WFL  Wrist ulnar deviation      Wrist radial deviation      Wrist pronation      Wrist supination      (Blank rows = not tested)   UPPER EXTREMITY MMT:     MMT Right eval Left eval Left 05/06/2022 Left 05/27/2022  Shoulder flexion 4+/5 3-/5 3+/5 3+/5  Shoulder abduction 4+/5 3-/5 3-/5 3+/5  Shoulder adduction      Shoulder extension      Shoulder internal rotation      Shoulder external rotation      Middle trapezius      Lower trapezius      Elbow flexion 4+/5 4-/5 4+/5 5/5  Elbow extension 4+/5 4-/5 4+/5 5/5  Wrist flexion 4+/5 3-/5 4/5 4+/5  Wrist extension 4+/5 3/5 4/5 4+/5  Wrist ulnar deviation      Wrist radial deviation  Wrist pronation      Wrist supination      (Blank rows = not tested)  HAND FUNCTION: Grip strength: Right: 30 lbs; Left: 13 lbs, Lateral pinch: Right: 15 lbs, Left: 8 lbs, and 3 point pinch: Right: 13 lbs, Left: 7 lbs  05/06/2022 Grip strength: Right: 30 lbs; Left: 14 lbs, Lateral pinch: Right: 15 lbs, Left: 10 lbs, and 3 point pinch: Right: 13 lbs, Left: 7 lbs  05/27/2022:  Grip strength: Right: 30 lbs; Left: 17 lbs, Lateral pinch: Right: 15 lbs, Left: 11 lbs, and 3 point pinch: Right: 13 lbs, Left: 7 lbs       COORDINATION: 9 Hole Peg test: Right: 26  sec; Left: 1 min. & 9 sec  05/06/2022 9 Hole Peg test: Right: 26  sec; Left: 1 min.  05/27/2022  9 Hole Peg test: Right: 26  sec; Left: 1 min. 4 sec.  SENSATION: Light touch: WFL Proprioception: WFL  COGNITION: Overall cognitive status: Within functional limits for tasks assessed  VISION: Subjective report: Glasses. Just received a new prescription  to increase bifocal strength Baseline vision: Macular Degeneration Visual history: macular degeneration  VISION ASSESSMENT: To be further assessed in functional context  Left sided Awareness    PERCEPTION: Limited left sided awareness   TODAY'S TREATMENT:   Neuromuscular re-education:  Pt. worked on left [facility] skills manipulating nuts, and bolts on a bolt board. Pt. worked on screwing, and unscrewing nuts, and bolts of varying sizes, and challenging progressively smaller items.    Measurements were obtained, and goals were reviewed with the Pt. Pt. has made excellent progress with left shoulder ROM, and elbow, wrist, grip, and pinch strength. Pt. is now able to cut meat independently, prepare light meals, and snacks. Pt. continues to present with impaired hand function, and [facility] skills needed to pick up small objects. Pt. is able to hold a hair dryer, however has difficulty efficiently blow drying the back of her head.  Pt. Has difficulty holding a coffee pot while filling it. Pt. Continues to work on improving LUE strength, and [facility] skills needed to improve left hand function, and maximize independence with ADLs, and IADLs.    PATIENT EDUCATION: Education details: Kitchen safety/use of external memory aids to turn off burners, limit distractions when in kitchen Person educated: Patient Education method: verbal cues Education comprehension: verbalized understanding and pt does not plan to cook unless son is present.   HOME EXERCISE PROGRAM:  Continue with an ongoing assessment of HEP needs    GOALS: Goals reviewed with patient? Yes   SHORT TERM GOALS: Target date:  04/15/2022      Pt. Will improve FOTO score by 2 points to reflect improved pt. perceived functional performance Baseline: FOTO: 57, TR score: 62 10th visit: FOTO: 65, TR score: 62 Goal status: Achieved  LONG TERM GOALS: Target date: 08/19/2022    Pt. Will improve left UE strength by 2 mm grades to assist with  repositioning her grandson on her lap. Baseline: Eval: left shoulder flexion: 3-/5, abduction 3-/5,  elbow flex/ext. 4-/5, wrist extension 3/5, wrist flexion 3-/5  10th visit: left shoulder flexion: 3+/5, abduction 3-+5,  elbow flex/ext. 4+/5, wrist extension 4/5, wrist flexion 4/5 05/27/2022: left shoulder flexion: 3+/5, abduction 3+/5,  elbow flex/ext. 5/5, wrist extension 4+/5, wrist flexion 4+/5  Goal status: Ongoing  2.  Pt. will improve left grip strength by 5# to be able to independently hold and use a blow dryer, and brush for hair care. Baseline: Eval:  Left grip strength 13#. Pt. Has difficulty performing hair care, holding and using a brush, and blow dryer. 10th visit: Left grip 14#. Pt. Is now able to hold th e blow dryer for the sides, and top of head, not the back. 11/07: Left 17# Pt. Has difficulty blow drying then back of her head. Goal status: Ongoing  3.  Pt. Will improve left lateral pinch strength by 3# to be able to independently cut meat. Baseline: Eval: Left 8, Pt. Has difficulty cutting meat. 10th: Left: 10#.  Pt. Is able to cut tender meat, however is not as efficient 11/07: Left 11#. Pt. Is independent cutting meat with increased time. Goal status:  Achieved  4.  Pt. Will improve Left hand [facility] skills to be able to be able to independently, and efficiently manipulate small objects during ADL tasks. Baseline: Eval: left: 1 min. 9 sec. 10th visit: Left: 1 min. Pt. Is improving with manipulating small objects. 11/07: Left: 1 min. & 4 sec. Pt. Has difficulty manipulating small objects. Goal status: Ongoing   5.  Pt. Will perform light meal preparation with modified independence Baseline: Eval: Pt. Reports having difficulty performing light  meal preparation, and cooking tasks. 10th visit: Pt. Is able to make toast in the morning, a sandwich,  use the microwave, make coffee, and pour herself a drink. 11/07: Pt. Has difficulty holding a coffee pot when filling the reservoir Goal  status: Ongoing  ASSESSMENT:  CLINICAL IMPRESSION:  Measurements were obtained, and goals were reviewed with the Pt. Pt. has made excellent progress with left shoulder ROM, and elbow, wrist, grip, and pinch strength. Pt. is now able to cut meat independently, prepare light meals, and snacks. Pt. continues to present with impaired hand function, and [facility] skills needed to pick up small objects. Pt. is able to hold a hair dryer, however has difficulty efficiently blow drying the back of her head.  Pt. Has difficulty holding a coffee pot while filling it. Pt. Continues to work on improving LUE strength, and [facility] skills needed to improve left hand function, and maximize independence with ADLs, and IADLs.   PERFORMANCE DEFICITS in functional skills including ADLs, IADLs, coordination, dexterity, sensation, ROM, strength, FMC, decreased knowledge of use of DME, vision, and UE functional use, cognitive skills including attention, and psychosocial skills including coping strategies, environmental adaptation, habits, and routines and behaviors.   IMPAIRMENTS are limiting patient from ADLs, IADLs, and social participation.   COMORBIDITIES may have co-morbidities  that affects occupational performance. Patient will benefit from skilled OT to address above impairments and improve overall function.  MODIFICATION OR ASSISTANCE TO COMPLETE EVALUATION: Min-Moderate modification of tasks or assist with assess necessary to complete an evaluation.  OT OCCUPATIONAL PROFILE AND HISTORY: Detailed assessment: Review of records and additional review of physical, cognitive, psychosocial history related to current functional performance.  CLINICAL DECISION MAKING: Moderate - several treatment options, min-mod task modification necessary  REHAB POTENTIAL: Good  EVALUATION COMPLEXITY: Moderate    PLAN: OT FREQUENCY: 2x/week  OT DURATION: 12 weeks  PLANNED INTERVENTIONS: self care/ADL training, therapeutic exercise,  therapeutic activity, neuromuscular re-education, manual therapy, passive range of motion, and paraffin  RECOMMENDED OTHER SERVICES: ST, and PT  CONSULTED AND AGREED WITH PLAN OF CARE: Patient  PLAN FOR NEXT SESSION:  L hand strengthening and coordination exercises  Olegario Messier, MS, OTR/L   Olegario Messier, MS, OTR/L

## 2022-05-27 NOTE — Therapy (Signed)
OUTPATIENT PHYSICAL THERAPY NEURO TREATMENT    Patient Name: Alyssa Mayo MRN: [id number] DOB:08-24-1945, 76 y.o., female Today's Date: 05/27/2022   PCP: [place] Pink, MD REFERRING PROVIDER: Frann Rider, NP   PT End of Session - 05/27/22 1023     Visit Number 12    Number of Visits 25    Date for PT Re-Evaluation 06/25/22    Authorization Type [employer]    Authorization Time Period 04/02/2022-06/25/2022    Progress Note Due on Visit 20    PT Start Time 1019    PT Stop Time 1059    PT Time Calculation (min) 40 min    Equipment Utilized During Treatment Gait belt    Activity Tolerance Patient tolerated treatment well    Behavior During Therapy [facility] for tasks assessed/performed                Past Medical History:  Diagnosis Date   Abnormal levels of other serum enzymes 07/17/2015   Arthritis    Constipation 07/11/2015   COVID-19 03/06/2021   Diabetes mellitus without complication ([place])    Elevated liver enzymes    Fatty liver    Generalized abdominal pain 07/11/2015   Hyperlipidemia    Hypertension    Lifelong obesity    Macular degeneration    Morbid obesity due to excess calories ([place]) 07/11/2015   Other fatigue 07/11/2015   Sleep apnea    Spinal headache    Past Surgical History:  Procedure Laterality Date   ABDOMINAL HYSTERECTOMY     APPENDECTOMY     CATARACT EXTRACTION     CERVICAL LAMINECTOMY  07/21/1985   CESAREAN SECTION     COLONOSCOPY  07/21/2012   diverticulosis, [facility] Dr. Donnella Sham    COLONOSCOPY WITH PROPOFOL N/A 02/04/2019   Procedure: COLONOSCOPY WITH PROPOFOL;  Surgeon: Lollie Sails, MD;  Location: [facility] ENDOSCOPY;  Service: Endoscopy;  Laterality: N/A;   COLONOSCOPY WITH PROPOFOL N/A 03/18/2021   Procedure: COLONOSCOPY WITH PROPOFOL;  Surgeon: Lesly Rubenstein, MD;  Location: [facility] ENDOSCOPY;  Service: Endoscopy;  Laterality: N/A;  DM   DIAGNOSTIC LAPAROSCOPY     ESOPHAGOGASTRODUODENOSCOPY (EGD) WITH PROPOFOL N/A  02/04/2019   Procedure: ESOPHAGOGASTRODUODENOSCOPY (EGD) WITH PROPOFOL;  Surgeon: Lollie Sails, MD;  Location[facility] ENDOSCOPY;  Service: Endoscopy;  Laterality: N/A;   ESOPHAGOGASTRODUODENOSCOPY (EGD) WITH PROPOFOL N/A 03/18/2021   Procedure: ESOPHAGOGASTRODUODENOSCOPY (EGD) WITH PROPOFOL;  Surgeon: Lesly Rubenstein, MD;  Location: [facility] ENDOSCOPY;  Service: Endoscopy;  Laterality: N/A;   LOOP RECORDER INSERTION N/A 08/05/2021   Procedure: LOOP RECORDER INSERTION;  Surgeon: Evans Lance, MD;  Location: [place] CV LAB;  Service: Cardiovascular;  Laterality: N/A;   [place]     TUBAL LIGATION     Patient Active Problem List   Diagnosis Date Noted   Cerebral edema ([place]) 08/07/2021   OSA (obstructive sleep apnea) 08/07/2021   Right middle cerebral artery stroke ([place]) 08/07/2021   CVA (cerebral vascular accident) ([place]) 08/01/2021   GERD (gastroesophageal reflux disease) 07/20/2019   Advanced care planning/counseling discussion 12/17/2016   Skin lesions, generalized 11/13/2016   Chronic right hip pain 06/17/2016   Vitamin D deficiency 09/26/2015   Diastasis recti 08/08/2015   Elevated serum GGT level 07/24/2015   Essential hypertension 07/17/2015   Fatty liver 07/17/2015   Elevated alkaline phosphatase level 07/17/2015   Elevated serum glutamic pyruvic transaminase (SGPT) level 07/17/2015   Abnormal finding on EKG 07/11/2015   Morbid obesity ([place]) 07/11/2015   Colon, diverticulosis  07/11/2015   Abdominal wall hernia 07/11/2015   DM (diabetes mellitus), type 2 with neurological complications [facility])    Hyperlipidemia     ONSET DATE: January 11th 2023  REFERRING DIAG:  Q30.092,Z30.0 (ICD-10-CM) - Gait disturbance, post-stroke  I63.511 (ICD-10-CM) - Right middle cerebral artery stroke (HCC)    THERAPY DIAG:  Difficulty in walking, not elsewhere classified  Muscle weakness (generalized)  Unsteadiness on feet  Other lack of coordination  Rationale for Evaluation  and Treatment Rehabilitation  SUBJECTIVE:                                                                                                                                                                                              SUBJECTIVE STATEMENT:  Pt reports doing well without any new complaints. When asked what she feels like is her biggest deficit- she answered "my endurance"  Pt accompanied by: self  PERTINENT HISTORY: Per chart and confirmed by pt:  Pt is a 76 yo female with RMCA CVA infarction with hemorrhagic transformation 08/01/2021, with inpatient rehab 08/07/2021-08/22/2021 followed by [facility] therapy until June. Pt now currently being seen for outpatient OT and ST services. Pt reports limitations in stamina persist. Other PMH is significant for HTN, HLD, macular degeneration, DM, obesity, sleep apnea, arthritis, chronic R hip pain, vitamin D deficiency, diastasis recti, abdominal wall hernia, hx of abdominal hysterectomy, appendectomy, spine surgery (years ago).   PAIN:  Are you having pain? No Pain Location:    TODAY'S TREATMENT:   TherEx:  3# weights donned  Seated march x12 each LE Seated LAQ x15 each LE Ambulation in clinic without assistive device and using 3Lb AW x 300 feet Sit to stand  without UE support (VC for forward lean) x10 reps Seated hip march up and over 1/2 spike ball  2x12 (slight increase weakness noted on left side)  2 min step tap- 32 steps without UE support for 1st min and very light UE support for 2nd min  Neuro re-ed:   Ambulation in clinic with cross body ball exchange with therapist, x4 length of hallway Retrogait approx 25 feet x 3 - Min VC to take a longer step Tandem gait x 20sec x3 trials each side - No LOB- CGA SLS- attempting several trial each LE - up to 3 sec at best   PATIENT EDUCATION: Education details: exam findings, indications, plan, HEP Person educated: Patient Education method: Explanation, Demonstration, Verbal cues, and  Handouts Education comprehension: verbalized understanding, returned demonstration, verbal cues required, and needs further education   HOME EXERCISE PROGRAM: 05/01/22: Pt and son educated to perform NBOS cross body reaching  at home, at kitchen counter  Access Code: [email or web address] URL: [email or web address] Date: 04/02/2022 Prepared by: Ricard Dillon  Exercises - Seated March  - 1 x daily - 5 x weekly - 3 sets - 15 reps - Standing Single Leg Stance with Counter Support  - 1 x daily - 7 x weekly - 2 sets - 2 reps - 30 seconds hold    GOALS: Goals reviewed with patient? Yes  SHORT TERM GOALS: Target date: 07/08/2022  Patient will be independent in home exercise program to improve strength/mobility for better functional independence with ADLs. Baseline: initiated Goal status: INITIAL   LONG TERM GOALS: Target date: 08/19/2022  Patient will increase FOTO score to equal to or greater than 65  to demonstrate improvement in mobility and quality of life.  Baseline: 57 Goal status: INITIAL  2.  Patient (> 50 years old) will complete five times sit to stand test in < 15 seconds indicating an increased LE strength and improved balance. Baseline: 17 sec hands-free; 11/1: 15.36 sec Goal status: IN-PROGRESS  3.  Patient will increase 10 meter walk test to >1.57ms as to improve gait speed for better community ambulation and to reduce fall risk. Baseline: 0.66 m/s; 11/1:  0.85 m/s Goal status: IN-PROGRESS  4.  Patient will increase Berg Balance score by > 3 points to demonstrate decreased fall risk during functional activities. Baseline: 50/56 Goal status: INITIAL  5.  Patient will increase six minute walk test distance to >1000 for progression to community ambulator and improve gait ability Baseline: 04/10/22: 6781f(pain onset at 5 minutes); 05/21/22: 1027 ft Goal status: MET    ASSESSMENT:  CLINICAL IMPRESSION:  Patient performed well overall today. She was able to  engage in  more endurance based activities without excessive fatigue. Patient was challenged with dynamic balance- including single leg stance and tandem and will benefit from continued focus to improve her overall balance.  Pt will continue to benefit from skilled therapy to address remaining deficits in order to improve overall QoL and return to PLOF   OBJECTIVE IMPAIRMENTS Abnormal gait, decreased activity tolerance, decreased balance, decreased coordination, decreased endurance, decreased mobility, difficulty walking, decreased strength, impaired sensation, impaired UE functional use, improper body mechanics, postural dysfunction, and pain.   ACTIVITY LIMITATIONS carrying, lifting, bending, standing, squatting, stairs, bed mobility, bathing, reach over head, hygiene/grooming, and locomotion level  PARTICIPATION LIMITATIONS: meal prep, cleaning, laundry, medication management, personal finances, driving, shopping, community activity, and yard work  PERSONAL FACTORS Age, Sex, Time since onset of injury/illness/exacerbation, and 3+ comorbidities: Other PMH is significant for HTN, HLD, macular degeneration, DM, obesity, sleep apnea, arthritis, chronic R hip pain, vitamin D deficiency, diastasis recti, abdominal wall hernia, hx of abdominal hysterectomy, appendectomy, spine surgery (years ago).   are also affecting patient's functional outcome.   REHAB POTENTIAL: Good  CLINICAL DECISION MAKING: Evolving/moderate complexity  EVALUATION COMPLEXITY: Moderate  PLAN: PT FREQUENCY: 2x/week  PT DURATION: 12 weeks  PLANNED INTERVENTIONS: Therapeutic exercises, Therapeutic activity, Neuromuscular re-education, Balance training, Gait training, Patient/Family education, Self Care, Joint mobilization, Joint manipulation, Stair training, Vestibular training, Canalith repositioning, Orthotic/Fit training, DME instructions, Dry Needling, Electrical stimulation, Wheelchair mobility training, Spinal mobilization,  Cryotherapy, Moist heat, Splintting, Taping, Traction, Ultrasound, Biofeedback, Manual therapy, and Re-evaluation  PLAN FOR NEXT SESSION:  multidirectional reaching coupled with cognitive/balance, strength, balance, gait, challenge peripheral vision   JeOllen BowlPT Physical Therapist- [facility]11/07/23, 4:00 PM

## 2022-05-27 NOTE — Therapy (Addendum)
OUTPATIENT SPEECH LANGUAGE PATHOLOGY TREATMENT NOTE   Patient Name: Alyssa Mayo MRN: [id number] DOB:03-20-1946, 76 y.o., female Today's Date: 05/27/2022   PCP: Alyssa Pink, MD REFERRING PROVIDER: Frann Rider, Mayo  END OF SESSION:   End of Session - 05/27/2022     Visit Number 14   Number of Visits 25    Date for SLP Re-Evaluation 05/27/22    Authorization Type [employer] Medicare HMO/PPO    Progress Note Due on Visit 20    SLP Start Time 1100    SLP Stop Time  1200    SLP Time Calculation (min) 60 min    Activity Tolerance Patient tolerated treatment well             Past Medical History:  Diagnosis Date   Abnormal levels of other serum enzymes 07/17/2015   Arthritis    Constipation 07/11/2015   COVID-19 03/06/2021   Diabetes mellitus without complication ([place])    Elevated liver enzymes    Fatty liver    Generalized abdominal pain 07/11/2015   Hyperlipidemia    Hypertension    Lifelong obesity    Macular degeneration    Morbid obesity due to excess calories ([place]) 07/11/2015   Other fatigue 07/11/2015   Sleep apnea    Spinal headache    Past Surgical History:  Procedure Laterality Date   ABDOMINAL HYSTERECTOMY     APPENDECTOMY     CATARACT EXTRACTION     CERVICAL LAMINECTOMY  07/21/1985   CESAREAN SECTION     COLONOSCOPY  07/21/2012   diverticulosis, [facility] Dr. Donnella Mayo    COLONOSCOPY WITH PROPOFOL N/A 02/04/2019   Procedure: COLONOSCOPY WITH PROPOFOL;  Surgeon: Alyssa Sails, MD;  Location: [facility] ENDOSCOPY;  Service: Endoscopy;  Laterality: N/A;   COLONOSCOPY WITH PROPOFOL N/A 03/18/2021   Procedure: COLONOSCOPY WITH PROPOFOL;  Surgeon: Alyssa Rubenstein, MD;  Location: [facility] ENDOSCOPY;  Service: Endoscopy;  Laterality: N/A;  DM   DIAGNOSTIC LAPAROSCOPY     ESOPHAGOGASTRODUODENOSCOPY (EGD) WITH PROPOFOL N/A 02/04/2019   Procedure: ESOPHAGOGASTRODUODENOSCOPY (EGD) WITH PROPOFOL;  Surgeon: Alyssa Sails, MD;  Location: [facility] ENDOSCOPY;  Service:  Endoscopy;  Laterality: N/A;   ESOPHAGOGASTRODUODENOSCOPY (EGD) WITH PROPOFOL N/A 03/18/2021   Procedure: ESOPHAGOGASTRODUODENOSCOPY (EGD) WITH PROPOFOL;  Surgeon: Alyssa Rubenstein, MD;  Location: [facility] ENDOSCOPY;  Service: Endoscopy;  Laterality: N/A;   LOOP RECORDER INSERTION N/A 08/05/2021   Procedure: LOOP RECORDER INSERTION;  Surgeon: Alyssa Lance, MD;  Location: [place] CV LAB;  Service: Cardiovascular;  Laterality: N/A;   SPINE SURGERY     TUBAL LIGATION     Patient Active Problem List   Diagnosis Date Noted   Cerebral edema ([place]) 08/07/2021   OSA (obstructive sleep apnea) 08/07/2021   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   CVA (cerebral vascular accident) ([place]) 08/01/2021   GERD (gastroesophageal reflux disease) 07/20/2019   Advanced care planning/counseling discussion 12/17/2016   Skin lesions, generalized 11/13/2016   Chronic right hip pain 06/17/2016   Vitamin D deficiency 09/26/2015   Diastasis recti 08/08/2015   Elevated serum GGT level 07/24/2015   Essential hypertension 07/17/2015   Fatty liver 07/17/2015   Elevated alkaline phosphatase level 07/17/2015   Elevated serum glutamic pyruvic transaminase (SGPT) level 07/17/2015   Abnormal finding on EKG 07/11/2015   Morbid obesity ([place]) 07/11/2015   Colon, diverticulosis 07/11/2015   Abdominal wall hernia 07/11/2015   DM (diabetes mellitus), type 2 with neurological complications ([place])    Hyperlipidemia  ONSET DATE: 08/01/2021 CVA; 02/26/2022 date of referral   REFERRING DIAG: I69.319 (ICD-10-CM) - Cognitive deficit, post-stroke I63.511 (ICD-10-CM) - Right middle cerebral artery stroke [facility])     PERTINENT HISTORY: [name] is a 76 y.o. female with history of diabetes, hypertension, hyperlipidemia, obesity, and sleep apnea who presented to [facility] ED on 08/01/2021 after being found down by her son. Her last know well was approximately 07/29/2021. Pt transferred to [facility] for further CVA  management. In addition, pt received ST services in [place] from 08/08/2021 thru 08/21/2021.      DIAGNOSTIC FINDINGS: MRI showed extensive restricted diffusion throughout much of the right MCA territory with associated changes of hemorrhagic transformation and localized cerebral edema and mass effect. Per Dr. Leonie Mayo, felt embolic pattern secondary to unclear source although concerning for occult A fib. Repeat [facility] 1/14 showed no interval progression of right MCA hemorrhagic infarct with petechial hemorrhage.  CTA head/neck showed right M2 thrombus.  EF 60 to 65%.   THERAPY DIAG:  Cognitive communication deficit  Right middle cerebral artery stroke [facility])  Rationale for Evaluation and Treatment Rehabilitation  SUBJECTIVE: pt pleasant, eager, talking about a sleep over that her grandson had over the weekend  Pt accompanied by: self  PAIN:  Are you having pain? No  PATIENT GOALS to be able to drive again and live independently (she currently lives alone but her son plans on moving in with her)  OBJECTIVE:   TODAY'S TREATMENT: Skilled treatment session focused on pt's cognitive communication goals. SLP facilitated the session by providing the following interventions:  Semi-complex problem solving puzzle - Moderate assistance required to organize task, maximal assistance for awareness of errors and self correcting - resulted in ~ 50% accuracy and more than a reasonable amount of time to finish    PATIENT EDUCATION: Education details: cognitive organization; directional awareness for navigating the hallways Person educated: Patient and Child(ren) Education method: Explanation, Demonstration, Verbal cues, and Handouts Education comprehension: needs further education  GOALS: Goals reviewed with patient? Yes  REPORTING PERIOD 03/04/2022 to 05/13/2022 SHORT TERM GOALS: Target date: 10 sessions   With moderate assistance, pt will recall her medicines with 80% accuracy.  Baseline: currently  unable Goal status: MET; 91% recall independently   2.  With minimal assistance, pt will complete basic medication management task with > 90% accuracy.  Baseline: total Goal status: ONGOING; currently requiring moderate assistance    3.  Pt will demonstrate selective attention to basic task for 15 minutes with < 2 redirections.  Baseline: moderate assistance Goal status: ONGOING; currently requiring minimal assistance   4.  Pt will complete basic bill paying task with 75% accuracy.  Baseline: total Goal status: ONGOING; currently at 50% with moderate assistance  5. With minimal assistance, pt will use strategies to complete semi-complex visuospatial tasks with 85% accuracy.   Baseline: moderate  Goal status: INITIAL  6. Pt will require < 2 cues to initiate leaving session in 5 out of 6 opportunities.   Baseline: 6 cues  Goal status: INITIAL     LONG TERM GOALS: Target date: 05/27/2022 With rare min A, pt will organize her medicines accurately in a pill organizer.  Baseline: Total Goal status: ONGOING   2.  With rare min A, pt will demonstrate selective attention to task for 30 minutes.  Baseline: moderate Goal status: ONGOING   3.  With rare min A, pt will sequence basic meal prep activity.  Baseline: Moderate Goal status: ONGOING  ASSESSMENT:  CLINICAL IMPRESSION: While pt continues to present with moderate cognitive communication impairments, she presents with improved ability to utilize compensatory problem solving strategies to achieve improved accuracy. Her acute deficits include increased distractibility, decreased task initiation, self-awareness of deficits and possible visual perceptual deficits as well as recall of directional navigation.    OBJECTIVE IMPAIRMENTS include attention and executive functioning. These impairments are limiting patient from managing medications, managing appointments, managing finances, household responsibilities, and  ADLs/IADLs. Factors affecting potential to achieve goals and functional outcome are ability to learn/carryover information. Patient will benefit from skilled SLP services to address above impairments and improve overall function.  REHAB POTENTIAL: Good  PLAN: SLP FREQUENCY: 1-2x/week  SLP DURATION: 12 weeks  PLANNED INTERVENTIONS: Functional tasks, SLP instruction and feedback, and Patient/family education   Yarima Penman B. Rutherford Nail, M.S., CCC-SLP, Mining engineer Certified Brain Injury [place]  [place] Office [phone] Ascom [phone] Fax [phone]

## 2022-05-28 ENCOUNTER — Encounter: Payer: Medicare HMO | Admitting: Speech Pathology

## 2022-05-29 ENCOUNTER — Ambulatory Visit: Payer: Medicare HMO | Admitting: Speech Pathology

## 2022-05-29 ENCOUNTER — Encounter: Payer: Medicare HMO | Admitting: Occupational Therapy

## 2022-05-29 ENCOUNTER — Ambulatory Visit: Payer: Medicare HMO

## 2022-05-29 DIAGNOSIS — R41841 Cognitive communication deficit: Secondary | ICD-10-CM

## 2022-05-29 DIAGNOSIS — R2681 Unsteadiness on feet: Secondary | ICD-10-CM

## 2022-05-29 DIAGNOSIS — R278 Other lack of coordination: Secondary | ICD-10-CM

## 2022-05-29 DIAGNOSIS — I63511 Cerebral infarction due to unspecified occlusion or stenosis of right middle cerebral artery: Secondary | ICD-10-CM

## 2022-05-29 DIAGNOSIS — R262 Difficulty in walking, not elsewhere classified: Secondary | ICD-10-CM

## 2022-05-29 DIAGNOSIS — M6281 Muscle weakness (generalized): Secondary | ICD-10-CM

## 2022-05-29 NOTE — Therapy (Signed)
OUTPATIENT PHYSICAL THERAPY NEURO TREATMENT    Patient Name: Alyssa Mayo MRN: [id number] DOB:08-Feb-1946, 76 y.o., female Today's Date: 05/29/2022   PCP: [place] Pink, MD REFERRING PROVIDER: Frann Rider, NP   PT End of Session - 05/29/22 1104     Visit Number 13    Number of Visits 25    Date for PT Re-Evaluation 06/25/22    Authorization Type [employer] Medicare    Authorization Time Period 04/02/2022-06/25/2022    Progress Note Due on Visit 20    PT Start Time 1101    PT Stop Time 1145    PT Time Calculation (min) 44 min    Equipment Utilized During Treatment Gait belt    Activity Tolerance Patient tolerated treatment well    Behavior During Therapy [facility] for tasks assessed/performed              Past Medical History:  Diagnosis Date   Abnormal levels of other serum enzymes 07/17/2015   Arthritis    Constipation 07/11/2015   COVID-19 03/06/2021   Diabetes mellitus without complication ([place])    Elevated liver enzymes    Fatty liver    Generalized abdominal pain 07/11/2015   Hyperlipidemia    Hypertension    Lifelong obesity    Macular degeneration    Morbid obesity due to excess calories ([place]) 07/11/2015   Other fatigue 07/11/2015   Sleep apnea    Spinal headache    Past Surgical History:  Procedure Laterality Date   ABDOMINAL HYSTERECTOMY     APPENDECTOMY     CATARACT EXTRACTION     CERVICAL LAMINECTOMY  07/21/1985   CESAREAN SECTION     COLONOSCOPY  07/21/2012   diverticulosis, [facility] Dr. Donnella Sham    COLONOSCOPY WITH PROPOFOL N/A 02/04/2019   Procedure: COLONOSCOPY WITH PROPOFOL;  Surgeon: Lollie Sails, MD;  Location: [facility] ENDOSCOPY;  Service: Endoscopy;  Laterality: N/A;   COLONOSCOPY WITH PROPOFOL N/A 03/18/2021   Procedure: COLONOSCOPY WITH PROPOFOL;  Surgeon: Lesly Rubenstein, MD;  Location: [facility] ENDOSCOPY;  Service: Endoscopy;  Laterality: N/A;  DM   DIAGNOSTIC LAPAROSCOPY     ESOPHAGOGASTRODUODENOSCOPY (EGD) WITH PROPOFOL N/A  02/04/2019   Procedure: ESOPHAGOGASTRODUODENOSCOPY (EGD) WITH PROPOFOL;  Surgeon: Lollie Sails, MD;  [facility] END[facility]COPY;  Service: Endoscopy;  Laterality: N/A;   ESOPHAGOGASTRODUODENOSCOPY (EGD) WITH PROPOFOL N/A 03/18/2021   Procedure: ESOPHAGOGASTRODUODENOSCOPY (EGD) WITH PROPOFOL;  Surgeon: Lesly Rubenstein, MD;  Location: [facility] ENDOSCOPY;  Service: Endoscopy;  Laterality: N/A;   LOOP RECORDER INSERTION N/A 08/05/2021   Procedure: LOOP RECORDER INSERTION;  Surgeon: Evans Lance, MD;  Location: [place] CV LAB;  Service: Cardiovascular;  Laterality: N/A;   [place]     TUBAL LIGATION     Patient Active Problem List   Diagnosis Date Noted   Cerebral edema ([place]) 08/07/2021   OSA (obstructive sleep apnea) 08/07/2021   Right middle cerebral artery stroke ([place]) 08/07/2021   CVA (cerebral vascular accident) ([place]) 08/01/2021   GERD (gastroesophageal reflux disease) 07/20/2019   Advanced care planning/counseling discussion 12/17/2016   Skin lesions, generalized 11/13/2016   Chronic right hip pain 06/17/2016   Vitamin D deficiency 09/26/2015   Diastasis recti 08/08/2015   Elevated serum GGT level 07/24/2015   Essential hypertension 07/17/2015   Fatty liver 07/17/2015   Elevated alkaline phosphatase level 07/17/2015   Elevated serum glutamic pyruvic transaminase (SGPT) level 07/17/2015   Abnormal finding on EKG 07/11/2015   Morbid obesity ([place]) 07/11/2015   Colon, diverticulosis 07/11/2015  Abdominal wall hernia 07/11/2015   DM (diabetes mellitus), type 2 with neurological complications [facility])    Hyperlipidemia     ONSET DATE: January 11th 2023  REFERRING DIAG:  I69.398,R26.9 (ICD-10-CM) - Gait disturbance, post-stroke  I63.511 (ICD-10-CM) - Right middle cerebral artery stroke (HCC)    THERAPY DIAG:  Difficulty in walking, not elsewhere classified  Muscle weakness (generalized)  Unsteadiness on feet  Other lack of coordination  Rationale for Evaluation  and Treatment Rehabilitation  SUBJECTIVE:                                                                                                                                                                                              SUBJECTIVE STATEMENT:  Pt reports she is feeling good today.  Pt notes she had a good session with previous therapist.  Pt accompanied by: self  PERTINENT HISTORY: Per chart and confirmed by pt:  Pt is a 76 yo female with RMCA CVA infarction with hemorrhagic transformation 08/01/2021, with inpatient rehab 08/07/2021-08/22/2021 followed by [facility] therapy until June. Pt now currently being seen for outpatient OT and ST services. Pt reports limitations in stamina persist. Other PMH is significant for HTN, HLD, macular degeneration, DM, obesity, sleep apnea, arthritis, chronic R hip pain, vitamin D deficiency, diastasis recti, abdominal wall hernia, hx of abdominal hysterectomy, appendectomy, spine surgery (years ago).   PAIN:  Are you having pain? No Pain Location:    TODAY'S TREATMENT:   TherEx:  4# weights donned  Seated march 2x15 each LE Seated LAQ 2x15 each LE Ambulation in clinic without assistive device and using 4Lb AW x 450 feet Sit to stand without UE support 2x10 reps Seated hip march up and over hedgehogs on each side 2x15 (continued weakness of the L hip)    Neuro re-ed:   Ambulation in clinic with cross body ball tosses with therapist, length of hallway x8 Backwards ambulation, length of hallway x4 - Min VC to take a longer step specifically on the R    PATIENT EDUCATION: Education details: exam findings, indications, plan, HEP Person educated: Patient Education method: Consulting civil engineer, Demonstration, Verbal cues, and Handouts Education comprehension: verbalized understanding, returned demonstration, verbal cues required, and needs further education   HOME EXERCISE PROGRAM: 05/01/22: Pt and son educated to perform NBOS cross body reaching at  home, at [employer]  Access Code: [email or web address] URL: [email or web address] Date: 04/02/2022 Prepared by: Ricard Dillon  Exercises - Seated March  - 1 x daily - 5 x weekly - 3 sets - 15 reps - Standing Single Leg Stance with Counter Support  - 1 x daily - 7  x weekly - 2 sets - 2 reps - 30 seconds hold    GOALS: Goals reviewed with patient? Yes  SHORT TERM GOALS: Target date: 07/10/2022  Patient will be independent in home exercise program to improve strength/mobility for better functional independence with ADLs. Baseline: initiated Goal status: INITIAL   LONG TERM GOALS: Target date: 08/21/2022  Patient will increase FOTO score to equal to or greater than 65  to demonstrate improvement in mobility and quality of life.  Baseline: 57 Goal status: INITIAL  2.  Patient (> 73 years old) will complete five times sit to stand test in < 15 seconds indicating an increased LE strength and improved balance. Baseline: 17 sec hands-free; 11/1: 15.36 sec Goal status: IN-PROGRESS  3.  Patient will increase 10 meter walk test to >1.67ms as to improve gait speed for better community ambulation and to reduce fall risk. Baseline: 0.66 m/s; 11/1:  0.85 m/s Goal status: IN-PROGRESS  4.  Patient will increase Berg Balance score by > 3 points to demonstrate decreased fall risk during functional activities. Baseline: 50/56 Goal status: INITIAL  5.  Patient will increase six minute walk test distance to >1000 for progression to community ambulator and improve gait ability Baseline: 04/10/22: 6717f(pain onset at 5 minutes); 05/21/22: 1027 ft Goal status: MET    ASSESSMENT:  CLINICAL IMPRESSION:  Pt able to perform exercises with increased resistance and also performed better with the endurance training by ambulating around the gym for more laps with increased weight around the ankles.  Pt is making significant improvements as therapy progress.  Pt continues to put forth good effort  throughout session.   Pt will continue to benefit from skilled therapy to address remaining deficits in order to improve overall QoL and return to PLOF.      OBJECTIVE IMPAIRMENTS Abnormal gait, decreased activity tolerance, decreased balance, decreased coordination, decreased endurance, decreased mobility, difficulty walking, decreased strength, impaired sensation, impaired UE functional use, improper body mechanics, postural dysfunction, and pain.   ACTIVITY LIMITATIONS carrying, lifting, bending, standing, squatting, stairs, bed mobility, bathing, reach over head, hygiene/grooming, and locomotion level  PARTICIPATION LIMITATIONS: meal prep, cleaning, laundry, medication management, personal finances, driving, shopping, community activity, and yard work  PERSONAL FACTORS Age, Sex, Time since onset of injury/illness/exacerbation, and 3+ comorbidities: Other PMH is significant for HTN, HLD, macular degeneration, DM, obesity, sleep apnea, arthritis, chronic R hip pain, vitamin D deficiency, diastasis recti, abdominal wall hernia, hx of abdominal hysterectomy, appendectomy, spine surgery (years ago).   are also affecting patient's functional outcome.   REHAB POTENTIAL: Good  CLINICAL DECISION MAKING: Evolving/moderate complexity  EVALUATION COMPLEXITY: Moderate  PLAN: PT FREQUENCY: 2x/week  PT DURATION: 12 weeks  PLANNED INTERVENTIONS: Therapeutic exercises, Therapeutic activity, Neuromuscular re-education, Balance training, Gait training, Patient/Family education, Self Care, Joint mobilization, Joint manipulation, Stair training, Vestibular training, Canalith repositioning, Orthotic/Fit training, DME instructions, Dry Needling, Electrical stimulation, Wheelchair mobility training, Spinal mobilization, Cryotherapy, Moist heat, Splintting, Taping, Traction, Ultrasound, Biofeedback, Manual therapy, and Re-evaluation  PLAN FOR NEXT SESSION:   multidirectional reaching coupled with  cognitive/balance, strength, balance, gait, challenge peripheral vision   JeOllen BowlPT Physical Therapist- [facility]11/09/23, 11:58 AM

## 2022-05-29 NOTE — Therapy (Addendum)
OUTPATIENT SPEECH LANGUAGE PATHOLOGY TREATMENT NOTE RE-CERTIFICATION REQUEST   Patient Name: Alyssa Mayo MRN: [id number] DOB:February 16, 1946, 76 y.o., female Today's Date: 05/29/2022   PCP: Alyssa Pink, MD REFERRING PROVIDER: Frann Rider, NP  END OF SESSION:     End of Session - 05/29/2022    Visit Number 15   Number of Visits 27   Date for SLP Re-Evaluation 05/29/2022 thru 08/21/2022   Authorization Type A[employer]na Medicare HMO/PPO    Progress Note Due on Visit 20    SLP Start Time 1000   SLP Stop Time  1100    SLP Time Calculation (min) 60 min    Activity Tolerance Patient tolerated treatment well             Past Medical History:  Diagnosis Date   Abnormal levels of other serum enzymes 07/17/2015   Arthritis    Constipation 07/11/2015   COVID-19 03/06/2021   Diabetes mellitus without complication ([place])    Elevated liver enzymes    Fatty liver    Generalized abdominal pain 07/11/2015   Hyperlipidemia    Hypertension    Lifelong obesity    Macular degeneration    Morbid obesity due to excess calories ([place]) 07/11/2015   Other fatigue 07/11/2015   Sleep apnea    Spinal headache    Past Surgical History:  Procedure Laterality Date   ABDOMINAL HYSTERECTOMY     APPENDECTOMY     CATARACT EXTRACTION     CERVICAL LAMINECTOMY  07/21/1985   CESAREAN SECTION     COLONOSCOPY  07/21/2012   diverticulosis, [facility] Dr. Donnella Mayo    COLONOSCOPY WITH PROPOFOL N/A 02/04/2019   Procedure: COLONOSCOPY WITH PROPOFOL;  Surgeon: Alyssa Sails, MD;  Location: [facility] ENDOSCOPY;  Service: Endoscopy;  Laterality: N/A;   COLONOSCOPY WITH PROPOFOL N/A 03/18/2021   Procedure: COLONOSCOPY WITH PROPOFOL;  Surgeon: Alyssa Rubenstein, MD;  Location: [facility] ENDOSCOPY;  Service: Endoscopy;  Laterality: N/A;  DM   DIAGNOSTIC LAPAROSCOPY     ESOPHAGOGASTRODUODENOSCOPY (EGD) WITH PROPOFOL N/A 02/04/2019   Procedure: ESOPHAGOGASTRODUODENOSCOPY (EGD) WITH PROPOFOL;  Surgeon: Alyssa Sails, MD;  Location: [facility] ENDOSCOPY;  Service: Endoscopy;  Laterality: N/A;   ESOPHAGOGASTRODUODENOSCOPY (EGD) WITH PROPOFOL N/A 03/18/2021   Procedure: ESOPHAGOGASTRODUODENOSCOPY (EGD) WITH PROPOFOL;  Surgeon: Alyssa Rubenstein, MD;  Location: [facility] ENDOSCOPY;  Service: Endoscopy;  Laterality: N/A;   LOOP RECORDER INSERTION N/A 08/05/2021   Procedure: LOOP RECORDER INSERTION;  Surgeon: Alyssa Lance, MD;  Location: [place] CV LAB;  Service: Cardiovascular;  Laterality: N/A;   SPINE SURGERY     TUBAL LIGATION     Patient Active Problem List   Diagnosis Date Noted   Cerebral edema ([place]) 08/07/2021   OSA (obstructive sleep apnea) 08/07/2021   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   CVA (cerebral vascular accident) ([place]) 08/01/2021   GERD (gastroesophageal reflux disease) 07/20/2019   Advanced care planning/counseling discussion 12/17/2016   Skin lesions, generalized 11/13/2016   Chronic right hip pain 06/17/2016   Vitamin D deficiency 09/26/2015   Diastasis recti 08/08/2015   Elevated serum GGT level 07/24/2015   Essential hypertension 07/17/2015   Fatty liver 07/17/2015   Elevated alkaline phosphatase level 07/17/2015   Elevated serum glutamic pyruvic transaminase (SGPT) level 07/17/2015   Abnormal finding on EKG 07/11/2015   Morbid obesity ([place]) 07/11/2015   Colon, diverticulosis 07/11/2015   Abdominal wall hernia 07/11/2015   DM (diabetes mellitus), type 2 with neurological complications ([place])    Hyperlipidemia  ONSET DATE: 08/01/2021 CVA; 02/26/2022 date of referral   REFERRING DIAG: I69.319 (ICD-10-CM) - Cognitive deficit, post-stroke I63.511 (ICD-10-CM) - Right middle cerebral artery stroke [facility])     PERTINENT HISTORY: [name] is a 76 y.o. female with history of diabetes, hypertension, hyperlipidemia, obesity, and sleep apnea who presented to [facility] ED on 08/01/2021 after being found down by her son. Her last know well was approximately  07/29/2021. Pt transferred to [facility] for further CVA management. In addition, pt received ST services in CIR from 08/08/2021 thru 08/21/2021.      DIAGNOSTIC FINDINGS: MRI showed extensive restricted diffusion throughout much of the right MCA territory with associated changes of hemorrhagic transformation and localized cerebral edema and mass effect. Per Dr. Leonie Mayo, felt embolic pattern secondary to unclear source although concerning for occult A fib. Repeat [facility] 1/14 showed no interval progression of right MCA hemorrhagic infarct with petechial hemorrhage.  CTA head/neck showed right M2 thrombus.  EF 60 to 65%.   THERAPY DIAG:  Cognitive communication deficit  Right middle cerebral artery stroke [facility])  Rationale for Evaluation and Treatment Rehabilitation  SUBJECTIVE: "Alyssa Mayo actually let me drive yesterday, he took me to the church and let me drive around for a while."  Pt accompanied by: self  PAIN:  Are you having pain? No  PATIENT GOALS to be able to drive again and live independently (she currently lives alone but her son plans on moving in with her)  OBJECTIVE:   TODAY'S TREATMENT: Skilled treatment session focused on pt's cognitive communication goals. SLP facilitated the session by providing the following interventions:  Pt's difficulty problem solving basic puzzles is likely multifactorial in nature. There are times that it appears pt isn't able to differentiate shades of colors or shapes. When cued, pt has increased response time occasionally in excess of 1 minute.     PATIENT EDUCATION: Education details: Animator; directional awareness for navigating the hallways Person educated: Patient and Child(ren) Education method: Explanation, Demonstration, Verbal cues, and Handouts Education comprehension: needs further education  GOALS: Goals reviewed with patient? Yes  REPORTING PERIOD 03/04/2022 to 05/13/2022 SHORT TERM GOALS: Target date: 10 sessions   With  moderate assistance, pt will recall her medicines with 80% accuracy.  Baseline: currently unable Goal status: MET; 91% recall independently   2.  With minimal assistance, pt will complete basic medication management task with > 90% accuracy.  Baseline: total Goal status: ONGOING; currently requiring moderate to minimal assistance    3.  Pt will demonstrate selective attention to basic task for 15 minutes with < 2 redirections.  Baseline: moderate assistance Goal status: ONGOING; currently requiring minimal to supervision assistance   4.  Pt will complete basic bill paying task with 75% accuracy.  Baseline: total Goal status: ONGOING; currently at 50% with minimal assistance  5. With minimal assistance, pt will use strategies to complete semi-complex visuospatial tasks with 85% accuracy.   Baseline: moderate  Goal status: INITIAL; ONGOING  6. Pt will require < 2 cues to initiate leaving session in 5 out of 6 opportunities.   Baseline: 6 cues  Goal status: INITIAL; ONGOING     LONG TERM GOALS: Target date: 05/27/2022 With rare min A, pt will organize her medicines accurately in a pill organizer.  Baseline: Total Goal status: ONGOING   2.  With rare min A, pt will demonstrate selective attention to task for 30 minutes.  Baseline: moderate Goal status: ONGOING   3.  With rare min A, pt  will sequence basic meal prep activity.  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 She has moderate deficits in attention (selective, divided and alternating), perception (interpretation of sensory information - especially visual  information), insight & judgement (understanding her own limitation and what they mean), organization (arranging ideas in a useful order), processing speech (quick thinking & understanding), semi-complex problem solving  (finding solutions to obstacles), reasoning (logically thinking through situaitons) and executive functioning. As such, skilled St intervention continues to indicated and am requesting 12 week re-certification of ST POC.     OBJECTIVE IMPAIRMENTS include attention and executive functioning. These impairments are limiting patient from managing medications, managing appointments, managing finances, household responsibilities, and ADLs/IADLs. Factors affecting potential to achieve goals and functional outcome are ability to learn/carryover information. Patient will benefit from skilled SLP services to address above impairments and improve overall function.  REHAB POTENTIAL: Good  PLAN: SLP FREQUENCY: 1-2x/week  SLP DURATION: 12 weeks  PLANNED INTERVENTIONS: Functional tasks, SLP instruction and feedback, and Patient/family education   Adaleen Hulgan B. Rutherford Nail, M.S., CCC-SLP, Mining engineer Certified Brain Injury [place]  [place] Office [phone] Ascom [phone] Fax [phone]

## 2022-06-02 ENCOUNTER — Ambulatory Visit: Payer: Medicare HMO | Admitting: Speech Pathology

## 2022-06-02 ENCOUNTER — Ambulatory Visit (INDEPENDENT_AMBULATORY_CARE_PROVIDER_SITE_OTHER): Payer: Medicare HMO

## 2022-06-02 ENCOUNTER — Ambulatory Visit: Payer: Medicare HMO

## 2022-06-02 ENCOUNTER — Ambulatory Visit: Payer: Medicare HMO | Admitting: Occupational Therapy

## 2022-06-02 DIAGNOSIS — M6281 Muscle weakness (generalized): Secondary | ICD-10-CM

## 2022-06-02 DIAGNOSIS — R41841 Cognitive communication deficit: Secondary | ICD-10-CM | POA: Diagnosis not present

## 2022-06-02 DIAGNOSIS — R2681 Unsteadiness on feet: Secondary | ICD-10-CM

## 2022-06-02 DIAGNOSIS — R482 Apraxia: Secondary | ICD-10-CM

## 2022-06-02 DIAGNOSIS — I639 Cerebral infarction, unspecified: Secondary | ICD-10-CM | POA: Diagnosis not present

## 2022-06-02 DIAGNOSIS — I63511 Cerebral infarction due to unspecified occlusion or stenosis of right middle cerebral artery: Secondary | ICD-10-CM

## 2022-06-02 DIAGNOSIS — R278 Other lack of coordination: Secondary | ICD-10-CM

## 2022-06-02 DIAGNOSIS — R262 Difficulty in walking, not elsewhere classified: Secondary | ICD-10-CM

## 2022-06-02 NOTE — Therapy (Signed)
OUTPATIENT SPEECH LANGUAGE PATHOLOGY TREATMENT NOTE RE-CERTIFICATION REQUEST   Patient Name: Alyssa Mayo MRN: [id number] DOB:Aug 31, 1945, 76 y.o., female Today's Date: 06/02/2022     PCP: Alyssa Pink, MD REFERRING PROVIDER: Frann Rider, NP  END OF SESSION:     End of Session - 06/02/2022     Visit Number 15   Number of Visits 27   Date for SLP Re-Evaluation 08/21/2022   Authorization Type [employer] Medicare HMO/PPO    Progress Note Due on Visit 20    SLP Start Time 1100    SLP Stop Time  1200    SLP Time Calculation (min) 60 min    Activity Tolerance Patient tolerated treatment well             Past Medical History:  Diagnosis Date   Abnormal levels of other serum enzymes 07/17/2015   Arthritis    Constipation 07/11/2015   COVID-19 03/06/2021   Diabetes mellitus without complication ([place])    Elevated liver enzymes    Fatty liver    Generalized abdominal pain 07/11/2015   Hyperlipidemia    Hypertension    Lifelong obesity    Macular degeneration    Morbid obesity due to excess calories ([place]) 07/11/2015   Other fatigue 07/11/2015   Sleep apnea    Spinal headache    Past Surgical History:  Procedure Laterality Date   ABDOMINAL HYSTERECTOMY     APPENDECTOMY     CATARACT EXTRACTION     CERVICAL LAMINECTOMY  07/21/1985   CESAREAN SECTION     COLONOSCOPY  07/21/2012   diverticulosis, [facility] Dr. Donnella Mayo    COLONOSCOPY WITH PROPOFOL N/A 02/04/2019   Procedure: COLONOSCOPY WITH PROPOFOL;  Surgeon: Alyssa Sails, MD;  Location: [facility] ENDOSCOPY;  Service: Endoscopy;  Laterality: N/A;   COLONOSCOPY WITH PROPOFOL N/A 03/18/2021   Procedure: COLONOSCOPY WITH PROPOFOL;  Surgeon: Alyssa Rubenstein, MD;  Location: [facility] ENDOSCOPY;  Service: Endoscopy;  Laterality: N/A;  DM   DIAGNOSTIC LAPAROSCOPY     ESOPHAGOGASTRODUODENOSCOPY (EGD) WITH PROPOFOL N/A 02/04/2019   Procedure: ESOPHAGOGASTRODUODENOSCOPY (EGD) WITH PROPOFOL;  Surgeon: Alyssa Sails, MD;   Location: [facility] ENDOSCOPY;  Service: Endoscopy;  Laterality: N/A;   ESOPHAGOGASTRODUODENOSCOPY (EGD) WITH PROPOFOL N/A 03/18/2021   Procedure: ESOPHAGOGASTRODUODENOSCOPY (EGD) WITH PROPOFOL;  Surgeon: Alyssa Rubenstein, MD;  Location: [facility] ENDOSCOPY;  Service: Endoscopy;  Laterality: N/A;   LOOP RECORDER INSERTION N/A 08/05/2021   Procedure: LOOP RECORDER INSERTION;  Surgeon: Alyssa Lance, MD;  Location: [place] CV LAB;  Service: Cardiovascular;  Laterality: N/A;   SPINE SURGERY     TUBAL LIGATION     Patient Active Problem List   Diagnosis Date Noted   Cerebral edema ([place]) 08/07/2021   OSA (obstructive sleep apnea) 08/07/2021   Right middle cerebral artery stroke ([place]) 08/07/2021   CVA (cerebral vascular accident) ([employer]) 08/01/2021   GERD (gastroesophageal reflux disease) 07/20/2019   Advanced care planning/counseling discussion 12/17/2016   Skin lesions, generalized 11/13/2016   Chronic right hip pain 06/17/2016   Vitamin D deficiency 09/26/2015   Diastasis recti 08/08/2015   Elevated serum GGT level 07/24/2015   Essential hypertension 07/17/2015   Fatty liver 07/17/2015   Elevated alkaline phosphatase level 07/17/2015   Elevated serum glutamic pyruvic transaminase (SGPT) level 07/17/2015   Abnormal finding on EKG 07/11/2015   Morbid obesity ([place]) 07/11/2015   Colon, diverticulosis 07/11/2015   Abdominal wall hernia 07/11/2015   DM (diabetes mellitus), type 2 with neurological complications ([place])    Hyperlipidemia  ONSET DATE: 08/01/2021 CVA; 02/26/2022 date of referral   REFERRING DIAG: I69.319 (ICD-10-CM) - Cognitive deficit, post-stroke I63.511 (ICD-10-CM) - Right middle cerebral artery stroke [facility])     PERTINENT HISTORY: [name] is a 76 y.o. female with history of diabetes, hypertension, hyperlipidemia, obesity, and sleep apnea who presented to [facility] ED on 08/01/2021 after being found down by her son. Her last know well was approximately 07/29/2021. Pt  transferred to [facility] for further CVA management. In addition, pt received ST services in CIR from 08/08/2021 thru 08/21/2021.      DIAGNOSTIC FINDINGS: MRI showed extensive restricted diffusion throughout much of the right MCA territory with associated changes of hemorrhagic transformation and localized cerebral edema and mass effect. Per Dr. Leonie Mayo, felt embolic pattern secondary to unclear source although concerning for occult A fib. Repeat [facility] 1/14 showed no interval progression of right MCA hemorrhagic infarct with petechial hemorrhage.  CTA head/neck showed right M2 thrombus.  EF 60 to 65%.   THERAPY DIAG:  Cognitive communication deficit  Right middle cerebral artery stroke [facility])  Rationale for Evaluation and Treatment Rehabilitation  SUBJECTIVE: "I realized that I need to add people to my contacts list so that I can contact more people while greg is out of town."  Pt accompanied by: self  PAIN:  Are you having pain? No  PATIENT GOALS to be able to drive again and live independently (she currently lives alone but her son plans on moving in with her)  OBJECTIVE:   TODAY'S TREATMENT: Skilled treatment session focused on pt's cognitive communication goals. SLP facilitated the session by providing the following interventions:  Pt reports that when son arrived home from being out of town, he appeared frustrated that she had left soup sitting her bowl from Friday (3 days sitting out) as well as her coats piled up. Pt had difficulty verbally expressing situation regarding food sitting out. She reported being unable to locate the "regular" dish washing liquid. She was only able to find the "superpower" dish washing liquid but didn't use that on the bowl. While pt is able to articulate that the "superpower" dish washing liquid was appropriate, she was not able to articulate the rationale for not using on bowl.    Pt's difficulty problem solving basic puzzles is likely multifactorial in  nature. There are times that it appears pt isn't able to differentiate shades of colors or shapes. When cued, pt has increased response time occasionally in excess of 1 minute.     PATIENT EDUCATION: Education details: Animator; directional awareness for navigating the hallways Person educated: Patient and Child(ren) Education method: Explanation, Demonstration, Verbal cues, and Handouts Education comprehension: needs further education  GOALS: Goals reviewed with patient? Yes  REPORTING PERIOD 03/04/2022 to 05/13/2022 SHORT TERM GOALS: Target date: 10 sessions   With moderate assistance, pt will recall her medicines with 80% accuracy.  Baseline: currently unable Goal status: MET; 91% recall independently   2.  With minimal assistance, pt will complete basic medication management task with > 90% accuracy.  Baseline: total Goal status: ONGOING; currently requiring moderate to minimal assistance    3.  Pt will demonstrate selective attention to basic task for 15 minutes with < 2 redirections.  Baseline: moderate assistance Goal status: ONGOING; currently requiring minimal to supervision assistance   4.  Pt will complete basic bill paying task with 75% accuracy.  Baseline: total Goal status: ONGOING; currently at 50% with minimal assistance  5. With minimal assistance, pt will  use strategies to complete semi-complex visuospatial tasks with 85% accuracy.   Baseline: moderate  Goal status: INITIAL; ONGOING  6. Pt will require < 2 cues to initiate leaving session in 5 out of 6 opportunities.   Baseline: 6 cues  Goal status: INITIAL; ONGOING     LONG TERM GOALS: Target date: 05/27/2022 With rare min A, pt will organize her medicines accurately in a pill organizer.  Baseline: Total Goal status: ONGOING   2.  With rare min A, pt will demonstrate selective attention to task for 30 minutes.  Baseline: moderate Goal status: ONGOING   3.  With rare min A, pt will  sequence basic meal prep activity.  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 She has moderate deficits in attention (selective, divided and alternating), perception (interpretation of sensory information - especially visual  information), insight & judgement (understanding her own limitation and what they mean), organization (arranging ideas in a useful order), processing speech (quick thinking & understanding), semi-complex problem solving (finding solutions to obstacles), reasoning (logically thinking through situaitons) and executive functioning. As such, skilled St intervention continues to indicated and am requesting 12 week re-certification of ST POC.     OBJECTIVE IMPAIRMENTS include attention and executive functioning. These impairments are limiting patient from managing medications, managing appointments, managing finances, household responsibilities, and ADLs/IADLs. Factors affecting potential to achieve goals and functional outcome are ability to learn/carryover information. Patient will benefit from skilled SLP services to address above impairments and improve overall function.  REHAB POTENTIAL: Good  PLAN: SLP FREQUENCY: 1-2x/week  SLP DURATION: 12 weeks  PLANNED INTERVENTIONS: Functional tasks, SLP instruction and feedback, and Patient/family education   Odes Lolli B. Rutherford Nail, M.S., CCC-SLP, Mining engineer Certified Brain Injury [place]  [place] Office [phone] Ascom [phone] Fax [phone]

## 2022-06-02 NOTE — Therapy (Signed)
OUTPATIENT PHYSICAL THERAPY NEURO TREATMENT    Patient Name: Alyssa Mayo MRN: [id number] DOB:May 26, 1946, 76 y.o., female Today's Date: 06/02/2022   PCP: [place] Pink, MD REFERRING PROVIDER: Frann Rider, NP   PT End of Session - 06/02/22 1044     Visit Number 14    Number of Visits 25    Date for PT Re-Evaluation 06/25/22    Authorization Type A[employer]na Medicare    Authorization Time Period 04/02/2022-06/25/2022    Progress Note Due on Visit 20    PT Start Time 1015    PT Stop Time 1055    PT Time Calculation (min) 40 min    Equipment Utilized During Treatment Gait belt    Activity Tolerance Patient tolerated treatment well;No increased pain    Behavior During Therapy [facility] for tasks assessed/performed              Past Medical History:  Diagnosis Date   Abnormal levels of other serum enzymes 07/17/2015   Arthritis    Constipation 07/11/2015   COVID-19 03/06/2021   Diabetes mellitus without complication ([place])    Elevated liver enzymes    Fatty liver    Generalized abdominal pain 07/11/2015   Hyperlipidemia    Hypertension    Lifelong obesity    Macular degeneration    Morbid obesity due to excess calories ([place]) 07/11/2015   Other fatigue 07/11/2015   Sleep apnea    Spinal headache    Past Surgical History:  Procedure Laterality Date   ABDOMINAL HYSTERECTOMY     APPENDECTOMY     CATARACT EXTRACTION     CERVICAL LAMINECTOMY  07/21/1985   CESAREAN SECTION     COLONOSCOPY  07/21/2012   diverticulosis, [facility] Dr. Donnella Sham    COLONOSCOPY WITH PROPOFOL N/A 02/04/2019   Procedure: COLONOSCOPY WITH PROPOFOL;  Surgeon: Lollie Sails, MD;  Location: [facility] ENDOSCOPY;  Service: Endoscopy;  Laterality: N/A;   COLONOSCOPY WITH PROPOFOL N/A 03/18/2021   Procedure: COLONOSCOPY WITH PROPOFOL;  Surgeon: Lesly Rubenstein, MD;  Location: [facility] ENDOSCOPY;  Service: Endoscopy;  Laterality: N/A;  DM   DIAGNOSTIC LAPAROSCOPY     ESOPHAGOGASTRODUODENOSCOPY (EGD) WITH  PROPOFOL N/A 02/04/2019   Procedure: ESOPHAGOGASTRODUODENOSCOPY (EGD) WITH PROPOFOL;  Surgeon: Lollie Sails, MD;  Location: [facility] ENDOSCOPY;  Service: Endoscopy;  Laterality: N/A;   ESOPHAGOGASTRODUODENOSCOPY (EGD) WITH PROPOFOL N/A 03/18/2021   Procedure: ESOPHAGOGASTRODUODENOSCOPY (EGD) WITH PROPOFOL;  Surgeon: Lesly Rubenstein, MD;  Location: [facility] ENDOSCOPY;  Service: Endoscopy;  Laterality: N/A;   LOOP RECORDER INSERTION N/A 08/05/2021   Procedure: LOOP RECORDER INSERTION;  Surgeon: Evans Lance, MD;  Location: [place] CV LAB;  Service: Cardiovascular;  Laterality: N/A;   [place]     TUBAL LIGATION     Patient Active Problem List   Diagnosis Date Noted   Cerebral edema ([place]) 08/07/2021   OSA (obstructive sleep apnea) 08/07/2021   Right middle cerebral artery stroke ([place]) 08/07/2021   CVA (cerebral vascular accident) ([place]) 08/01/2021   GERD (gastroesophageal reflux disease) 07/20/2019   Advanced care planning/counseling discussion 12/17/2016   Skin lesions, generalized 11/13/2016   Chronic right hip pain 06/17/2016   Vitamin D deficiency 09/26/2015   Diastasis recti 08/08/2015   Elevated serum GGT level 07/24/2015   Essential hypertension 07/17/2015   Fatty liver 07/17/2015   Elevated alkaline phosphatase level 07/17/2015   Elevated serum glutamic pyruvic transaminase (SGPT) level 07/17/2015   Abnormal finding on EKG 07/11/2015   Morbid obesity ([place]) 07/11/2015   Colon, diverticulosis  07/11/2015   Abdominal wall hernia 07/11/2015   DM (diabetes mellitus), type 2 with neurological complications [facility])    Hyperlipidemia     ONSET DATE: January 11th 2023  REFERRING DIAG:  I69.398,R26.9 (ICD-10-CM) - Gait disturbance, post-stroke  I63.511 (ICD-10-CM) - Right middle cerebral artery stroke ([facility])    THERAPY DIAG:  Difficulty in walking, not elsewhere classified  Muscle weakness (generalized)  Unsteadiness on feet  Other lack of  coordination  Apraxia  Rationale for Evaluation and Treatment Rehabilitation  SUBJECTIVE:                                                                                                                                                                                              SUBJECTIVE STATEMENT:  Doing well. No pain. No updates or falls. HEP compliance is less than prescribed.   Pt accompanied by: self  PERTINENT HISTORY: Per chart and confirmed by pt:  Pt is a 76 yo female with RMCA CVA infarction with hemorrhagic transformation 08/01/2021, with inpatient rehab 08/07/2021-08/22/2021 followed by [facility] therapy until June. Pt now currently being seen for outpatient OT and ST services. Pt reports limitations in stamina persist. Other PMH is significant for HTN, HLD, macular degeneration, DM, obesity, sleep apnea, arthritis, chronic R hip pain, vitamin D deficiency, diastasis recti, abdominal wall hernia, hx of abdominal hysterectomy, appendectomy, spine surgery (years ago).   PAIN:  Are you having pain? No Pain Location:    TODAY'S TREATMENT: -STS from plinth x15 -seated marching x30 c 3lb AW -side stepping x75f -standing marching x30 unsupported -SLS 10x10sec bilat, intermittent hands  7 minutes conitnuous AMB in clinic over red mat, airex foam, 3step, hulla hoop x SPC: 1 LOB  HEP issue and update       PATIENT EDUCATION: Education details: exam findings, indications, plan, HEP Person educated: Patient Education method: EConsulting civil engineer Demonstration, Verbal cues, and Handouts Education comprehension: verbalized understanding, returned demonstration, verbal cues required, and needs further education   HOME EXERCISE PROGRAM: Access Code: [email or web address]: [email or web address] Date: 06/02/2022 Prepared by: ARebbeca Paul Exercises - Standing Single Leg Stance with Counter Support  - 2 x daily - 7 x weekly - 1 sets - 10 reps - 10s  hold - Standing Marching  - 2 x  daily - 7 x weekly - 2 sets - 20 reps - Side Stepping with Counter Support  - 2 x daily - 7 x weekly - 2 sets - 20 reps - Walking  - 2 x daily - 7 x weekly    GOALS: Goals reviewed with patient? Yes  SHORT TERM GOALS: Target date: 07/14/2022  Patient will be independent in home exercise program to improve strength/mobility for better functional independence with ADLs. Baseline: initiated Goal status: INITIAL   LONG TERM GOALS: Target date: 08/25/2022  Patient will increase FOTO score to equal to or greater than 65  to demonstrate improvement in mobility and quality of life.  Baseline: 57 Goal status: INITIAL  2.  Patient (> 65 years old) will complete five times sit to stand test in < 15 seconds indicating an increased LE strength and improved balance. Baseline: 17 sec hands-free; 11/1: 15.36 sec Goal status: IN-PROGRESS  3.  Patient will increase 10 meter walk test to >1.77ms as to improve gait speed for better community ambulation and to reduce fall risk. Baseline: 0.66 m/s; 11/1:  0.85 m/s Goal status: IN-PROGRESS  4.  Patient will increase Berg Balance score by > 3 points to demonstrate decreased fall risk during functional activities. Baseline: 50/56 Goal status: INITIAL  5.  Patient will increase six minute walk test distance to >1000 for progression to community ambulator and improve gait ability Baseline: 04/10/22: 6721f(pain onset at 5 minutes); 05/21/22: 1027 ft Goal status: MET    ASSESSMENT:  CLINICAL IMPRESSION: Pt still not AMB regularly at home or in community, encouraged to walk daily and improve compliance with HEP. Updated to reflect progress. Session tolerated well in general. Pt will continue to benefit from skilled therapy to address remaining deficits in order to improve overall QoL and return to PLOF.      OBJECTIVE IMPAIRMENTS Abnormal gait, decreased activity tolerance, decreased balance, decreased coordination, decreased endurance, decreased  mobility, difficulty walking, decreased strength, impaired sensation, impaired UE functional use, improper body mechanics, postural dysfunction, and pain.   ACTIVITY LIMITATIONS carrying, lifting, bending, standing, squatting, stairs, bed mobility, bathing, reach over head, hygiene/grooming, and locomotion level  PARTICIPATION LIMITATIONS: meal prep, cleaning, laundry, medication management, personal finances, driving, shopping, community activity, and yard work  PERSONAL FACTORS Age, Sex, Time since onset of injury/illness/exacerbation, and 3+ comorbidities: Other PMH is significant for HTN, HLD, macular degeneration, DM, obesity, sleep apnea, arthritis, chronic R hip pain, vitamin D deficiency, diastasis recti, abdominal wall hernia, hx of abdominal hysterectomy, appendectomy, spine surgery (years ago).   are also affecting patient's functional outcome.   REHAB POTENTIAL: Good  CLINICAL DECISION MAKING: Evolving/moderate complexity  EVALUATION COMPLEXITY: Moderate  PLAN: PT FREQUENCY: 2x/week  PT DURATION: 12 weeks  PLANNED INTERVENTIONS: Therapeutic exercises, Therapeutic activity, Neuromuscular re-education, Balance training, Gait training, Patient/Family education, Self Care, Joint mobilization, Joint manipulation, Stair training, Vestibular training, Canalith repositioning, Orthotic/Fit training, DME instructions, Dry Needling, Electrical stimulation, Wheelchair mobility training, Spinal mobilization, Cryotherapy, Moist heat, Splintting, Taping, Traction, Ultrasound, Biofeedback, Manual therapy, and Re-evaluation  PLAN FOR NEXT SESSION:  FU on compliance with daily  10:46 AM, 06/02/22 [place] GrandchildPT, DPT Physical Therapist - [phone]   06/02/22, 10:46 AM

## 2022-06-02 NOTE — Therapy (Addendum)
OCCUPATIONAL THERAPY TREATMENT NOTE  Patient Name: Alyssa Mayo MRN: [id number] DOB:[birth year], 76 y.o., female Today's Date: 03/14/2022  PCP: [name], MD REFERRING PROVIDER: [name], NP   OT End of Session - 05/27/22 1058     Visit Number 17   Number of Visits 24    Date for OT Re-Evaluation 08/19/2022   Authorization Type Progress reporting period starting 03/04/2022    OT Start Time 1100    OT Stop Time 1145    OT Time Calculation (min) 45 min    Activity Tolerance Patient tolerated treatment well    Behavior During Therapy [facility] for tasks assessed/performed             Past Medical History:  Diagnosis Date   Abnormal levels of other serum enzymes 07/17/2015   Arthritis    Constipation 07/11/2015   COVID-19 03/06/2021   Diabetes mellitus without complication (HCC)    Elevated liver enzymes    Fatty liver    Generalized abdominal pain 07/11/2015   Hyperlipidemia    Hypertension    Lifelong obesity    Macular degeneration    Morbid obesity due to excess calories (HCC) 07/11/2015   Other fatigue 07/11/2015   Sleep apnea    Spinal headache    Past Surgical History:  Procedure Laterality Date   ABDOMINAL HYSTERECTOMY     APPENDECTOMY     CATARACT EXTRACTION     CERVICAL LAMINECTOMY  07/21/1985   CESAREAN SECTION     COLONOSCOPY  07/21/2012   diverticulosis, [facility] Dr. [name]    COLONOSCOPY WITH PROPOFOL N/A 02/04/2019   Procedure: COLONOSCOPY WITH PROPOFOL;  Surgeon: [name], MD;  Location: [facility] ENDOSCOPY;  Service: Endoscopy;  Laterality: N/A;   COLONOSCOPY WITH PROPOFOL N/A 03/18/2021   Procedure: COLONOSCOPY WITH PROPOFOL;  Surgeon: [name], MD;  Location: [facility] ENDOSCOPY;  Service: Endoscopy;  Laterality: N/A;  DM   DIAGNOSTIC LAPAROSCOPY     ESOPHAGOGASTRODUODENOSCOPY (EGD) WITH PROPOFOL N/A 02/04/2019   Procedure: ESOPHAGOGASTRODUODENOSCOPY (EGD) WITH PROPOFOL;  Surgeon: [name], MD;  Location: [facility]  ENDOSCOPY;  Service: Endoscopy;  Laterality: N/A;   ESOPHAGOGASTRODUODENOSCOPY (EGD) WITH PROPOFOL N/A 03/18/2021   Procedure: ESOPHAGOGASTRODUODENOSCOPY (EGD) WITH PROPOFOL;  Surgeon: [name], MD;  Location: [facility] ENDOSCOPY;  Service: Endoscopy;  Laterality: N/A;   LOOP RECORDER INSERTION N/A 08/05/2021   Procedure: LOOP RECORDER INSERTION;  Surgeon: [name], MD;  Location: [facility] INVASIVE CV LAB;  Service: Cardiovascular;  Laterality: N/A;   SPINE SURGERY     TUBAL LIGATION     Patient Active Problem List   Diagnosis Date Noted   Cerebral edema (HCC) 08/07/2021   OSA (obstructive sleep apnea) 08/07/2021   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   CVA (cerebral vascular accident) (HCC) 08/01/2021   GERD (gastroesophageal reflux disease) 07/20/2019   Advanced care planning/counseling discussion 12/17/2016   Skin lesions, generalized 11/13/2016   Chronic right hip pain 06/17/2016   Vitamin D deficiency 09/26/2015   Diastasis recti 08/08/2015   Elevated serum GGT level 07/24/2015   Essential hypertension 07/17/2015   Fatty liver 07/17/2015   Elevated alkaline phosphatase level 07/17/2015   Elevated serum glutamic pyruvic transaminase (SGPT) level 07/17/2015   Abnormal finding on EKG 07/11/2015   Morbid obesity (HCC) 07/11/2015   Colon, diverticulosis 07/11/2015   Abdominal wall hernia 07/11/2015   DM (diabetes mellitus), type 2 with neurological complications (HCC)    Hyperlipidemia  ONSET DATE: 08/01/2021  REFERRING DIAG: CVA  THERAPY DIAG:  Muscle weakness (generalized)  Rationale for Evaluation and Treatment Rehabilitation  SUBJECTIVE:   SUBJECTIVE STATEMENT:   Pt. Reports that her son, and grandson went away to [facility] for the weekend.  Pt accompanied by: self  PERTINENT HISTORY:  Pt. Is a 76 y.o. female  presents with a diagnosis of RMCA CVA infarction with hemorrhagic transformation. Pt. Attended inpatient rehab from 08/07/2021-08/22/2021.  Pt. received Home health therapy services this past spring.  Pt. Has recently had an assessment through driver rehabilitation services at [facility] with recommendations for referrals for  outpatient OT/PT, and ST services. Pt. PMHx includes: HTN, Hyperlipidemia, Macular degeneration, DM, and Obesity.  PRECAUTIONS: None  WEIGHT BEARING RESTRICTIONS No  PAIN:  Are you having pain? No.    FALLS: Has patient fallen in last 6 months? Yes.  LIVING ENVIRONMENT: Lives with: lives with their family  Son  [name] Lives in: House/apartment Main living are on one floor Stairs:  2 steps to enter Has following equipment at home: Single point cane, Wheelchair (manual), shower chair, Grab bars, bed rail, and rubber mat.  PLOF: Independent  PATIENT GOALS: To be able to Drive, and cook again  OBJECTIVE:   HAND DOMINANCE: Right   ADLs: Overall ADLs:  Transfers/ambulation related to ADLs: Independent Eating: Independent Grooming: Pt. Fatigues with sustained BUE's in elevation for haircare. Especially with blow drying hair. UB Dressing: Independent pullover shirt, independent now with fastening a bra LB Dressing: Independent donning pants socks, and slide on shoes Toileting: Independent Bathing: Independent Tub Shower transfers: Walk-in shower Independent now   IADLs: Shopping: Needs to be accompanied to the grocery store. Light housekeeping: Does own laundry, laundry,  Meal Prep:  Difficulty with cooking Community mobility: Relies on son, and friend Medication management: Son sets up Mining engineer management: Manages monthly bills independently. Handwriting: No change from baseline  MOBILITY STATUS: Hx of falls out of bed   ACTIVITY TOLERANCE: Activity tolerance:  10-20 min. Before rest break  FUNCTIONAL OUTCOME MEASURES: FOTO: 57  TR score: 62  UPPER EXTREMITY ROM     Active ROM Right eval Left eval Left 05/06/2022 Left 05/27/2022  Shoulder flexion WFL 96(110) 107(110) 109(120)   Shoulder abduction WFL 82(105) 86(105) 92(115)  Shoulder adduction      Shoulder extension      Shoulder internal rotation      Shoulder external rotation      Elbow flexion [facility] WFL  Elbow extension [facility] WFL  Wrist flexion WFL 44(60) 68(68) WFL  Wrist extension WFL 32(60) 66(66) WFL  Wrist ulnar deviation      Wrist radial deviation      Wrist pronation      Wrist supination      (Blank rows = not tested)   UPPER EXTREMITY MMT:     MMT Right eval Left eval Left 05/06/2022 Left 05/27/2022  Shoulder flexion 4+/5 3-/5 3+/5 3+/5  Shoulder abduction 4+/5 3-/5 3-/5 3+/5  Shoulder adduction      Shoulder extension      Shoulder internal rotation      Shoulder external rotation      Middle trapezius      Lower trapezius      Elbow flexion 4+/5 4-/5 4+/5 5/5  Elbow extension 4+/5 4-/5 4+/5 5/5  Wrist flexion 4+/5 3-/5 4/5 4+/5  Wrist extension 4+/5 3/5 4/5 4+/5  Wrist ulnar deviation      Wrist radial deviation  Wrist pronation      Wrist supination      (Blank rows = not tested)  HAND FUNCTION: Grip strength: Right: 30 lbs; Left: 13 lbs, Lateral pinch: Right: 15 lbs, Left: 8 lbs, and 3 point pinch: Right: 13 lbs, Left: 7 lbs  05/06/2022 Grip strength: Right: 30 lbs; Left: 14 lbs, Lateral pinch: Right: 15 lbs, Left: 10 lbs, and 3 point pinch: Right: 13 lbs, Left: 7 lbs  05/27/2022:  Grip strength: Right: 30 lbs; Left: 17 lbs, Lateral pinch: Right: 15 lbs, Left: 11 lbs, and 3 point pinch: Right: 13 lbs, Left: 7 lbs  COORDINATION:  9 Hole Peg test: Right: 26  sec; Left: 1 min. & 9 sec  05/06/2022 9 Hole Peg test: Right: 26  sec; Left: 1 min.  05/27/2022  9 Hole Peg test: Right: 26  sec; Left: 1 min. 4 sec.  SENSATION: Light touch: WFL Proprioception: WFL  COGNITION: Overall cognitive status: Within functional limits for tasks assessed  VISION: Subjective report: Glasses. Just received a new prescription to increase bifocal  strength Baseline vision: Macular Degeneration Visual history: macular degeneration  VISION ASSESSMENT: To be further assessed in functional context  Left sided Awareness    PERCEPTION: Limited left sided awareness   TODAY'S TREATMENT:   Neuromuscular re-education:  Pt. performed FMC tasks using the Grooved pegboard. Pt. worked on grasping the grooved pegs from a horizontal position, and moving the pegs to a vertical position in the hand to prepare for placing them in the grooved slot. Pt. worked on turning them with the tips of the 2nd digit, and thumb. Pt. worked on removing them alternating thumb opposition to the tip of the 2nd through 5th digits. Pt. worked on Left hand [facility] skills grasping 1/2" circular tipped pegs. Pt. worked on Comptroller moving the pegs from her palm to the tip of her 2nd digit, and thumb.  Pt. reports that she had a busy weekend with her son visiting from [place], [place]. Pt. required increased time to move the pegs from the palm of his hand to the tip of his 2nd digit, and thumb in preparation for placing them into the grooved pegboard. Pt. presented with difficulty turning each of the circular tipped pegs within the tip of her second digit, and thumb. Pt. continues to work on improving left hand strength and fine motor coordination skills in order to be able to hold objects more securely, cut meat, manipulate small objects, and maximize overall independence with ADLs and IADL tasks.      PATIENT EDUCATION: Education details: Kitchen safety/use of external memory aids to turn off burners, limit distractions when in kitchen Person educated: Patient Education method: verbal cues Education comprehension: verbalized understanding and pt does not plan to cook unless son is present.   HOME EXERCISE PROGRAM:  Continue with an ongoing assessment of HEP needs    GOALS: Goals reviewed with patient? Yes   SHORT TERM GOALS: Target date:  04/15/2022       Pt. Will improve FOTO score by 2 points to reflect improved pt. perceived functional performance Baseline: FOTO: 57, TR score: 62 10th visit: FOTO: 65, TR score: 62 Goal status: Achieved  LONG TERM GOALS: Target date: 08/19/2022    Pt. Will improve left UE strength by 2 mm grades to assist with repositioning her grandson on her lap. Baseline: Eval: left shoulder flexion: 3-/5, abduction 3-/5,  elbow flex/ext. 4-/5, wrist extension 3/5, wrist flexion 3-/5  10th visit: left shoulder flexion: 3+/5,  abduction 3-+5,  elbow flex/ext. 4+/5, wrist extension 4/5, wrist flexion 4/5 05/27/2022: left shoulder flexion: 3+/5, abduction 3+/5,  elbow flex/ext. 5/5, wrist extension 4+/5, wrist flexion 4+/5  Goal status: Ongoing  2.  Pt. will improve left grip strength by 5# to be able to independently hold and use a blow dryer, and brush for hair care. Baseline: Eval: Left grip strength 13#. Pt. Has difficulty performing hair care, holding and using a brush, and blow dryer. 10th visit: Left grip 14#. Pt. Is now able to hold th e blow dryer for the sides, and top of head, not the back. 11/07: Left 17# Pt. Has difficulty blow drying then back of her head. Goal status: Ongoing  3.  Pt. Will improve left lateral pinch strength by 3# to be able to independently cut meat. Baseline: Eval: Left 8, Pt. Has difficulty cutting meat. 10th: Left: 10#.  Pt. Is able to cut tender meat, however is not as efficient 11/07: Left 11#. Pt. Is independent cutting meat with increased time. Goal status:  Achieved  4.  Pt. Will improve Left hand [facility] skills to be able to be able to independently, and efficiently manipulate small objects during ADL tasks. Baseline: Eval: left: 1 min. 9 sec. 10th visit: Left: 1 min. Pt. Is improving with manipulating small objects. 11/07: Left: 1 min. & 4 sec. Pt. Has difficulty manipulating small objects. Goal status: Ongoing   5.  Pt. Will perform light meal preparation with modified  independence Baseline: Eval: Pt. Reports having difficulty performing light  meal preparation, and cooking tasks. 10th visit: Pt. Is able to make toast in the morning, a sandwich,  use the microwave, make coffee, and pour herself a drink. 11/07: Pt. Has difficulty holding a coffee pot when filling the reservoir Goal status: Ongoing    CLINICAL IMPRESSION:  Pt. reports that she had a busy weekend with her son visiting from [place], [place]. Pt. required increased time to move the pegs from the palm of his hand to the tip of his 2nd digit, and thumb in preparation for placing them into the grooved pegboard. Pt. presented with difficulty turning each of the circular tipped pegs within the tip of her second digit, and thumb. Pt. continues to work on improving left hand strength and fine motor coordination skills in order to be able to hold objects more securely, cut meat, manipulate small objects, and maximize overall independence with ADLs and IADL tasks.     PERFORMANCE DEFICITS in functional skills including ADLs, IADLs, coordination, dexterity, sensation, ROM, strength, FMC, decreased knowledge of use of DME, vision, and UE functional use, cognitive skills including attention, and psychosocial skills including coping strategies, environmental adaptation, habits, and routines and behaviors.   IMPAIRMENTS are limiting patient from ADLs, IADLs, and social participation.   COMORBIDITIES may have co-morbidities  that affects occupational performance. Patient will benefit from skilled OT to address above impairments and improve overall function.  MODIFICATION OR ASSISTANCE TO COMPLETE EVALUATION: Min-Moderate modification of tasks or assist with assess necessary to complete an evaluation.  OT OCCUPATIONAL PROFILE AND HISTORY: Detailed assessment: Review of records and additional review of physical, cognitive, psychosocial history related to current functional performance.  CLINICAL DECISION MAKING:  Moderate - several treatment options, min-mod task modification necessary  REHAB POTENTIAL: Good  EVALUATION COMPLEXITY: Moderate    PLAN: OT FREQUENCY: 2x/week  OT DURATION: 12 weeks  PLANNED INTERVENTIONS: self care/ADL training, therapeutic exercise, therapeutic activity, neuromuscular re-education, manual therapy, passive range of motion, and paraffin  RECOMMENDED OTHER SERVICES: ST, and PT  CONSULTED AND AGREED WITH PLAN OF CARE: Patient  PLAN FOR NEXT SESSION:  L hand strengthening and coordination exercises  Olegario Messier, MS, OTR/L   Olegario Messier, MS, OTR/L

## 2022-06-03 LAB — CUP PACEART REMOTE DEVICE CHECK
Date Time Interrogation Session: 20231113142433
Implantable Pulse Generator Implant Date: 20230116

## 2022-06-03 NOTE — Therapy (Signed)
OUTPATIENT PHYSICAL THERAPY NEURO TREATMENT    Patient Name: Alyssa Mayo MRN: [id number] DOB:[birth year], 76 y.o., female Today's Date: 06/04/2022   PCP: Alyssa Pink, MD REFERRING PROVIDER: Frann Rider, NP   PT End of Session - 06/04/22 1103     Visit Number 15    Number of Visits 25    Date for PT Re-Evaluation 06/25/22    Authorization Type [employer] Medicare    Authorization Time Period 04/02/2022-06/25/2022    Progress Note Due on Visit 20    PT Start Time 1104    PT Stop Time 1145    PT Time Calculation (min) 41 min    Equipment Utilized During Treatment Gait belt    Activity Tolerance Patient tolerated treatment well;No increased pain    Behavior During Therapy [facility] for tasks assessed/performed               Past Medical History:  Diagnosis Date   Abnormal levels of other serum enzymes 07/17/2015   Arthritis    Constipation 07/11/2015   COVID-19 03/06/2021   Diabetes mellitus without complication ([place])    Elevated liver enzymes    Fatty liver    Generalized abdominal pain 07/11/2015   Hyperlipidemia    Hypertension    Lifelong obesity    Macular degeneration    Morbid obesity due to excess calories (Alyssa Mayo) 07/11/2015   Other fatigue 07/11/2015   Sleep apnea    Spinal headache    Past Surgical History:  Procedure Laterality Date   ABDOMINAL HYSTERECTOMY     APPENDECTOMY     CATARACT EXTRACTION     CERVICAL LAMINECTOMY  07/21/1985   CESAREAN SECTION     COLONOSCOPY  07/21/2012   diverticulosis, [facility] Dr. Donnella Mayo    COLONOSCOPY WITH PROPOFOL N/A 02/04/2019   Procedure: COLONOSCOPY WITH PROPOFOL;  Surgeon: Alyssa Sails, MD;  Location: [facility] ENDOSCOPY;  Service: Endoscopy;  Laterality: N/A;   COLONOSCOPY WITH PROPOFOL N/A 03/18/2021   Procedure: COLONOSCOPY WITH PROPOFOL;  Surgeon: Alyssa Rubenstein, MD;  Location: [facility] ENDOSCOPY;  Service: Endoscopy;  Laterality: N/A;  DM   DIAGNOSTIC LAPAROSCOPY     ESOPHAGOGASTRODUODENOSCOPY (EGD) WITH  PROPOFOL N/A 02/04/2019   Procedure: ESOPHAGOGASTRODUODENOSCOPY (EGD) WITH PROPOFOL;  Surgeon: Alyssa Sails, MD;  Location[facility] ENDOSCOPY;  Service: Endoscopy;  Laterality: N/A;   ESOPHAGOGASTRODUODENOSCOPY (EGD) WITH PROPOFOL N/A 03/18/2021   Procedure: ESOPHAGOGASTRODUODENOSCOPY (EGD) WITH PROPOFOL;  Surgeon: Alyssa Rubenstein, MD;  Location: [facility] ENDOSCOPY;  Service: Endoscopy;  Laterality: N/A;   LOOP RECORDER INSERTION N/A 08/05/2021   Procedure: LOOP RECORDER INSERTION;  Surgeon: Mayo Lance, MD;  Location: [place] CV LAB;  Service: Cardiovascular;  Laterality: N/A;   [place]     TUBAL LIGATION     Patient Active Problem List   Diagnosis Date Noted   Cerebral edema (Alyssa Mayo) 08/07/2021   OSA (obstructive sleep apnea) 08/07/2021   Right middle cerebral artery stroke (Alyssa Mayo) 08/07/2021   CVA (cerebral vascular accident) ([place]) 08/01/2021   GERD (gastroesophageal reflux disease) 07/20/2019   Advanced care planning/counseling discussion 12/17/2016   Skin lesions, generalized 11/13/2016   Chronic right hip pain 06/17/2016   Vitamin D deficiency 09/26/2015   Diastasis recti 08/08/2015   Elevated serum GGT level 07/24/2015   Essential hypertension 07/17/2015   Fatty liver 07/17/2015   Elevated alkaline phosphatase level 07/17/2015   Elevated serum glutamic pyruvic transaminase (SGPT) level 07/17/2015   Abnormal finding on EKG 07/11/2015   Morbid obesity (Alyssa Mayo) 07/11/2015   Colon,  diverticulosis 07/11/2015   Abdominal wall hernia 07/11/2015   DM (diabetes mellitus), type 2 with neurological complications (Alyssa Mayo)    Hyperlipidemia     ONSET DATE: January 11th 2023  REFERRING DIAG:  I69.398,R26.9 (ICD-10-CM) - Gait disturbance, post-stroke  I63.511 (ICD-10-CM) - Right middle cerebral artery stroke (HCC)    THERAPY DIAG:  Difficulty in walking, not elsewhere classified  Muscle weakness (generalized)  Unsteadiness on feet  Rationale for Evaluation and Treatment  Rehabilitation  SUBJECTIVE:                                                                                                                                                                                              SUBJECTIVE STATEMENT: Pt denies pain today, noting a slight "touch" in the knees.  She notes she walked her driveway yesterday, and it felt good to do that.  Pt states that she was tired by the end of it, but was able to perform.   Pt accompanied by: self  PERTINENT HISTORY: Per chart and confirmed by pt:  Pt is a 76 yo female with RMCA CVA infarction with hemorrhagic transformation 08/01/2021, with inpatient rehab 08/07/2021-08/22/2021 followed by [facility] therapy until June. Pt now currently being seen for outpatient OT and ST services. Pt reports limitations in stamina persist. Other PMH is significant for HTN, HLD, macular degeneration, DM, obesity, sleep apnea, arthritis, chronic R hip pain, vitamin D deficiency, diastasis recti, abdominal wall hernia, hx of abdominal hysterectomy, appendectomy, spine surgery (years ago).   PAIN:  Are you having pain? No Pain Location:     TODAY'S TREATMENT:  TherEx:  Ambulation within the the hospital on carpeted surfaces, tile, all flat surfaces, 2 flights of ascending/descending stairs continuous walking for 30+ minutes without any necessary rest breaks   Neuro:  SLS while performing dynamic balance while reading pamphlets, 30 sec bouts Forward walking on/over rockerboard for increased proprioception, x10 attempts Static stance on incline with eyes open and eyes closed, 30 sec bouts     PATIENT EDUCATION: Education details: exam findings, indications, plan, HEP Person educated: Patient Education method: Consulting civil engineer, Demonstration, Verbal cues, and Handouts Education comprehension: verbalized understanding, returned demonstration, verbal cues required, and needs further education   HOME EXERCISE PROGRAM: Access Code: [email or web address] URL:  [email or web address] Date: 06/02/2022 Prepared by: [name]  Exercises - Standing Single Leg Stance with Counter Support  - 2 x daily - 7 x weekly - 1 sets - 10 reps - 10s  hold - Standing Marching  - 2 x daily - 7 x weekly - 2 sets - 20 reps - Side Stepping with Counter Support  -  2 x daily - 7 x weekly - 2 sets - 20 reps - Walking  - 2 x daily - 7 x weekly    GOALS: Goals reviewed with patient? Yes  SHORT TERM GOALS: Target date: 07/16/2022  Patient will be independent in home exercise program to improve strength/mobility for better functional independence with ADLs. Baseline: initiated Goal status: INITIAL   LONG TERM GOALS: Target date: 08/27/2022  Patient will increase FOTO score to equal to or greater than 65  to demonstrate improvement in mobility and quality of life.  Baseline: 57 Goal status: INITIAL  2.  Patient (> 36 years old) will complete five times sit to stand test in < 15 seconds indicating an increased LE strength and improved balance. Baseline: 17 sec hands-free; 11/1: 15.36 sec Goal status: IN-PROGRESS  3.  Patient will increase 10 meter walk test to >1.26ms as to improve gait speed for better community ambulation and to reduce fall risk. Baseline: 0.66 m/s; 11/1:  0.85 m/s Goal status: IN-PROGRESS  4.  Patient will increase Berg Balance score by > 3 points to demonstrate decreased fall risk during functional activities. Baseline: 50/56 Goal status: INITIAL  5.  Patient will increase six minute walk test distance to >1000 for progression to community ambulator and improve gait ability Baseline: 04/10/22: 6735f(pain onset at 5 minutes); 05/21/22: 1027 ft Goal status: MET    ASSESSMENT:  CLINICAL IMPRESSION:  Pt responded well to the activities given and enjoyed being able to ambulate around the hospital and go to the gift shop while participating in therapy.  Pt performed cognitive related tasks while in the gift shop and was able  to perform balance training as well.  Pt to continue with increased endurance training while performing cognitive tasks in order to return to PLOF.      OBJECTIVE IMPAIRMENTS Abnormal gait, decreased activity tolerance, decreased balance, decreased coordination, decreased endurance, decreased mobility, difficulty walking, decreased strength, impaired sensation, impaired UE functional use, improper body mechanics, postural dysfunction, and pain.   ACTIVITY LIMITATIONS carrying, lifting, bending, standing, squatting, stairs, bed mobility, bathing, reach over head, hygiene/grooming, and locomotion level  PARTICIPATION LIMITATIONS: meal prep, cleaning, laundry, medication management, personal finances, driving, shopping, community activity, and yard work  PERSONAL FACTORS Age, Sex, Time since onset of injury/illness/exacerbation, and 3+ comorbidities: Other PMH is significant for HTN, HLD, macular degeneration, DM, obesity, sleep apnea, arthritis, chronic R hip pain, vitamin D deficiency, diastasis recti, abdominal wall hernia, hx of abdominal hysterectomy, appendectomy, spine surgery (years ago).   are also affecting patient's functional outcome.   REHAB POTENTIAL: Good  CLINICAL DECISION MAKING: Evolving/moderate complexity  EVALUATION COMPLEXITY: Moderate  PLAN: PT FREQUENCY: 2x/week  PT DURATION: 12 weeks  PLANNED INTERVENTIONS: Therapeutic exercises, Therapeutic activity, Neuromuscular re-education, Balance training, Gait training, Patient/Family education, Self Care, Joint mobilization, Joint manipulation, Stair training, Vestibular training, Canalith repositioning, Orthotic/Fit training, DME instructions, Dry Needling, Electrical stimulation, Wheelchair mobility training, Spinal mobilization, Cryotherapy, Moist heat, Splintting, Taping, Traction, Ultrasound, Biofeedback, Manual therapy, and Re-evaluation  PLAN FOR NEXT SESSION:   HEP compliance and introduce more cognitive related  tasks.   JoGwenlyn SaranPT, DPT Physical Therapist- [facility]11/15/23, 2:20 PM

## 2022-06-04 ENCOUNTER — Ambulatory Visit: Payer: Medicare HMO

## 2022-06-04 ENCOUNTER — Ambulatory Visit: Payer: Medicare HMO | Admitting: Speech Pathology

## 2022-06-04 DIAGNOSIS — R2681 Unsteadiness on feet: Secondary | ICD-10-CM

## 2022-06-04 DIAGNOSIS — M6281 Muscle weakness (generalized): Secondary | ICD-10-CM

## 2022-06-04 DIAGNOSIS — R262 Difficulty in walking, not elsewhere classified: Secondary | ICD-10-CM

## 2022-06-04 DIAGNOSIS — I63511 Cerebral infarction due to unspecified occlusion or stenosis of right middle cerebral artery: Secondary | ICD-10-CM

## 2022-06-04 DIAGNOSIS — R41841 Cognitive communication deficit: Secondary | ICD-10-CM

## 2022-06-04 NOTE — Therapy (Signed)
OUTPATIENT SPEECH LANGUAGE PATHOLOGY TREATMENT NOTE    Patient Name: Alyssa Mayo MRN: [id number] DOB:Oct 21, 1945, 76 y.o., female Today's Date: 06/04/2022     PCP: Alyssa Pink, MD REFERRING PROVIDER: Frann Rider, NP   End of Session - 06/04/22 1012     Visit Number 17    Number of Visits 27    Date for SLP Re-Evaluation 08/21/22    Authorization Type [employer] Medicare HMO/PPO    Progress Note Due on Visit 20    SLP Start Time 1000    SLP Stop Time  1100    SLP Time Calculation (min) 60 min    Activity Tolerance Patient tolerated treatment well                  Past Medical History:  Diagnosis Date   Abnormal levels of other serum enzymes 07/17/2015   Arthritis    Constipation 07/11/2015   COVID-19 03/06/2021   Diabetes mellitus without complication ([place])    Elevated liver enzymes    Fatty liver    Generalized abdominal pain 07/11/2015   Hyperlipidemia    Hypertension    Lifelong obesity    Macular degeneration    Morbid obesity due to excess calories ([place]) 07/11/2015   Other fatigue 07/11/2015   Sleep apnea    Spinal headache    Past Surgical History:  Procedure Laterality Date   ABDOMINAL HYSTERECTOMY     APPENDECTOMY     CATARACT EXTRACTION     CERVICAL LAMINECTOMY  07/21/1985   CESAREAN SECTION     COLONOSCOPY  07/21/2012   diverticulosis, [facility] Dr. Donnella Sham    COLONOSCOPY WITH PROPOFOL N/A 02/04/2019   Procedure: COLONOSCOPY WITH PROPOFOL;  Surgeon: Lollie Sails, MD;  Location: [facility] ENDOSCOPY;  Service: Endoscopy;  Laterality: N/A;   COLONOSCOPY WITH PROPOFOL N/A 03/18/2021   Procedure: COLONOSCOPY WITH PROPOFOL;  Surgeon: Lesly Rubenstein, MD;  Location: [facility] ENDOSCOPY;  Service: Endoscopy;  Laterality: N/A;  DM   DIAGNOSTIC LAPAROSCOPY     ESOPHAGOGASTRODUODENOSCOPY (EGD) WITH PROPOFOL N/A 02/04/2019   Procedure: ESOPHAGOGASTRODUODENOSCOPY (EGD) WITH PROPOFOL;  Surgeon: Lollie Sails, MD;  Location: [facility] ENDOSCOPY;  Service:  Endoscopy;  Laterality: N/A;   ESOPHAGOGASTRODUODENOSCOPY (EGD) WITH PROPOFOL N/A 03/18/2021   Procedure: ESOPHAGOGASTRODUODENOSCOPY (EGD) WITH PROPOFOL;  Surgeon: Lesly Rubenstein, MD;  Location: [facility] ENDOSCOPY;  Service: Endoscopy;  Laterality: N/A;   LOOP RECORDER INSERTION N/A 08/05/2021   Procedure: LOOP RECORDER INSERTION;  Surgeon: Evans Lance, MD;  Location: [place] CV LAB;  Service: Cardiovascular;  Laterality: N/A;   SPINE SURGERY     TUBAL LIGATION     Patient Active Problem List   Diagnosis Date Noted   Cerebral edema ([place]) 08/07/2021   OSA (obstructive sleep apnea) 08/07/2021   Right middle cerebral artery stroke ([place]) 08/07/2021   CVA (cerebral vascular accident) ([place]) 08/01/2021   GERD (gastroesophageal reflux disease) 07/20/2019   Advanced care planning/counseling discussion 12/17/2016   Skin lesions, generalized 11/13/2016   Chronic right hip pain 06/17/2016   Vitamin D deficiency 09/26/2015   Diastasis recti 08/08/2015   Elevated serum GGT level 07/24/2015   Essential hypertension 07/17/2015   Fatty liver 07/17/2015   Elevated alkaline phosphatase level 07/17/2015   Elevated serum glutamic pyruvic transaminase (SGPT) level 07/17/2015   Abnormal finding on EKG 07/11/2015   Morbid obesity ([place]) 07/11/2015   Colon, diverticulosis 07/11/2015   Abdominal wall hernia 07/11/2015   DM (diabetes mellitus), type 2 with neurological complications ([place])  Hyperlipidemia     ONSET DATE: 08/01/2021 CVA; 02/26/2022 date of referral   REFERRING DIAG: I69.319 (ICD-10-CM) - Cognitive deficit, post-stroke I63.511 (ICD-10-CM) - Right middle cerebral artery stroke [facility])     PERTINENT HISTORY: [name] is a 76 y.o. female with history of diabetes, hypertension, hyperlipidemia, obesity, and sleep apnea who presented to [facility] ED on 08/01/2021 after being found down by her son. Her last know well was approximately 07/29/2021. Pt transferred to [facility] for further CVA  management. In addition, pt received ST services in [place] from 08/08/2021 thru 08/21/2021.      DIAGNOSTIC FINDINGS: MRI showed extensive restricted diffusion throughout much of the right MCA territory with associated changes of hemorrhagic transformation and localized cerebral edema and mass effect. Per Dr. Leonie Man, felt embolic pattern secondary to unclear source although concerning for occult A fib. Repeat [facility] 1/14 showed no interval progression of right MCA hemorrhagic infarct with petechial hemorrhage.  CTA head/neck showed right M2 thrombus.  EF 60 to 65%.   THERAPY DIAG:  Cognitive communication deficit  Right middle cerebral artery stroke [facility])  Rationale for Evaluation and Treatment Rehabilitation  SUBJECTIVE: "I realized that I need to add people to my contacts list so that I can contact more people while greg is out of town."  Pt accompanied by: self  PAIN:  Are you having pain? No  PATIENT GOALS to be able to drive again and live independently (she currently lives alone but her son plans on moving in with her)  OBJECTIVE:   TODAY'S TREATMENT: Skilled treatment session focused on pt's cognitive communication goals. SLP facilitated the session by providing the following interventions:  Pt reports that she missed paying a bill because she got an email bill but was waiting on a paper bill from same company Per pt, she receives a variety of methods for bills - email and paperwork  When looking thru pt's emails in session, pt also realized that she had not paid her [place] in addition to her [employer] bill  With maximal multimodal assistance, pt able to look thru email and purge unneeded emails as well as begin un-subscribing from emails Specifically, pt required verbal assistance to sequentially look at emails vs skipping thru emails    PATIENT EDUCATION: Education details: cognitive organization; directional awareness for navigating the hallways Person educated: Patient  and Child(ren) Education method: Explanation, Demonstration, Verbal cues, and Handouts Education comprehension: needs further education  HOME EXERCISE PROGRAM  Updating email - deleting and un-scribing emails  GOALS: Goals reviewed with patient? Yes  REPORTING PERIOD 03/04/2022 to 05/13/2022 SHORT TERM GOALS: Target date: 10 sessions   With moderate assistance, pt will recall her medicines with 80% accuracy.  Baseline: currently unable Goal status: MET; 91% recall independently   2.  With minimal assistance, pt will complete basic medication management task with > 90% accuracy.  Baseline: total Goal status: ONGOING; currently requiring moderate to minimal assistance    3.  Pt will demonstrate selective attention to basic task for 15 minutes with < 2 redirections.  Baseline: moderate assistance Goal status: ONGOING; currently requiring minimal to supervision assistance   4.  Pt will complete basic bill paying task with 75% accuracy.  Baseline: total Goal status: ONGOING; currently at 50% with minimal assistance  5. With minimal assistance, pt will use strategies to complete semi-complex visuospatial tasks with 85% accuracy.   Baseline: moderate  Goal status: INITIAL; ONGOING  6. Pt will require < 2 cues to initiate leaving  session in 5 out of 6 opportunities.   Baseline: 6 cues  Goal status: INITIAL; ONGOING     LONG TERM GOALS: Target date: 05/27/2022 With rare min A, pt will organize her medicines accurately in a pill organizer.  Baseline: Total Goal status: ONGOING   2.  With rare min A, pt will demonstrate selective attention to task for 30 minutes.  Baseline: moderate Goal status: ONGOING   3.  With rare min A, pt will sequence basic meal prep activity.  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 She has moderate  deficits in attention (selective, divided and alternating), perception (interpretation of sensory information - especially visual  information), insight & judgement (understanding her own limitation and what they mean), organization (arranging ideas in a useful order), processing speech (quick thinking & understanding), semi-complex problem solving (finding solutions to obstacles), reasoning (logically thinking through situaitons) and executive functioning. As such, skilled St intervention continues to indicated and am requesting 12 week re-certification of ST POC.     OBJECTIVE IMPAIRMENTS include attention and executive functioning. These impairments are limiting patient from managing medications, managing appointments, managing finances, household responsibilities, and ADLs/IADLs. Factors affecting potential to achieve goals and functional outcome are ability to learn/carryover information. Patient will benefit from skilled SLP services to address above impairments and improve overall function.  REHAB POTENTIAL: Good  PLAN: SLP FREQUENCY: 1-2x/week  SLP DURATION: 12 weeks  PLANNED INTERVENTIONS: Functional tasks, SLP instruction and feedback, and Patient/family education   Jaymian Bogart B. Rutherford Nail, M.S., CCC-SLP, Mining engineer Certified Brain Injury [place]  [place] Office [phone] Ascom [phone] Fax [phone]

## 2022-06-09 ENCOUNTER — Ambulatory Visit: Payer: Medicare HMO | Admitting: Occupational Therapy

## 2022-06-09 ENCOUNTER — Ambulatory Visit: Payer: Medicare HMO

## 2022-06-09 DIAGNOSIS — M6281 Muscle weakness (generalized): Secondary | ICD-10-CM

## 2022-06-09 DIAGNOSIS — R2681 Unsteadiness on feet: Secondary | ICD-10-CM

## 2022-06-09 DIAGNOSIS — R278 Other lack of coordination: Secondary | ICD-10-CM

## 2022-06-09 DIAGNOSIS — R41841 Cognitive communication deficit: Secondary | ICD-10-CM | POA: Diagnosis not present

## 2022-06-09 DIAGNOSIS — R262 Difficulty in walking, not elsewhere classified: Secondary | ICD-10-CM

## 2022-06-09 NOTE — Therapy (Signed)
OCCUPATIONAL THERAPY TREATMENT NOTE  Patient Name: Alyssa Mayo MRN: [id number] DOB:03/22/1946, 76 y.o., female Today's Date: 03/14/2022  PCP: [name], MD REFERRING PROVIDER: [name], NP    OT End of Session - 06/09/22 1149     Visit Number 18    Number of Visits 24    Date for OT Re-Evaluation 08/19/22    Authorization Type Progress reporting period starting 03/04/2022    OT Start Time 1145    OT Stop Time 1230    OT Time Calculation (min) 45 min    Activity Tolerance Patient tolerated treatment well    Behavior During Therapy [facility] for tasks assessed/performed                       Past Medical History:  Diagnosis Date   Abnormal levels of other serum enzymes 07/17/2015   Arthritis    Constipation 07/11/2015   COVID-19 03/06/2021   Diabetes mellitus without complication (HCC)    Elevated liver enzymes    Fatty liver    Generalized abdominal pain 07/11/2015   Hyperlipidemia    Hypertension    Lifelong obesity    Macular degeneration    Morbid obesity due to excess calories (HCC) 07/11/2015   Other fatigue 07/11/2015   Sleep apnea    Spinal headache    Past Surgical History:  Procedure Laterality Date   ABDOMINAL HYSTERECTOMY     APPENDECTOMY     CATARACT EXTRACTION     CERVICAL LAMINECTOMY  07/21/1985   CESAREAN SECTION     COLONOSCOPY  07/21/2012   diverticulosis, [facility] Dr. [name]    COLONOSCOPY WITH PROPOFOL N/A 02/04/2019   Procedure: COLONOSCOPY WITH PROPOFOL;  Surgeon: [name], MD;  Location: [facility] ENDOSCOPY;  Service: Endoscopy;  Laterality: N/A;   COLONOSCOPY WITH PROPOFOL N/A 03/18/2021   Procedure: COLONOSCOPY WITH PROPOFOL;  Surgeon: [name], MD;  Location: [facility] ENDOSCOPY;  Service: Endoscopy;  Laterality: N/A;  DM   DIAGNOSTIC LAPAROSCOPY     ESOPHAGOGASTRODUODENOSCOPY (EGD) WITH PROPOFOL N/A 02/04/2019   Procedure: ESOPHAGOGASTRODUODENOSCOPY (EGD) WITH PROPOFOL;  Surgeon: [name],  MD;  Location: [facility] ENDOSCOPY;  Service: Endoscopy;  Laterality: N/A;   ESOPHAGOGASTRODUODENOSCOPY (EGD) WITH PROPOFOL N/A 03/18/2021   Procedure: ESOPHAGOGASTRODUODENOSCOPY (EGD) WITH PROPOFOL;  Surgeon: [name], MD;  Location: [facility] ENDOSCOPY;  Service: Endoscopy;  Laterality: N/A;   LOOP RECORDER INSERTION N/A 08/05/2021   Procedure: LOOP RECORDER INSERTION;  Surgeon: [name], MD;  Location: [facility] INVASIVE CV LAB;  Service: Cardiovascular;  Laterality: N/A;   SPINE SURGERY     TUBAL LIGATION     Patient Active Problem List   Diagnosis Date Noted   Cerebral edema (HCC) 08/07/2021   OSA (obstructive sleep apnea) 08/07/2021   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   CVA (cerebral vascular accident) (HCC) 08/01/2021   GERD (gastroesophageal reflux disease) 07/20/2019   Advanced care planning/counseling discussion 12/17/2016   Skin lesions, generalized 11/13/2016   Chronic right hip pain 06/17/2016   Vitamin D deficiency 09/26/2015   Diastasis recti 08/08/2015   Elevated serum GGT level 07/24/2015   Essential hypertension 07/17/2015   Fatty liver 07/17/2015   Elevated alkaline phosphatase level 07/17/2015   Elevated serum glutamic pyruvic transaminase (SGPT) level 07/17/2015   Abnormal finding on EKG 07/11/2015   Morbid obesity (HCC) 07/11/2015   Colon, diverticulosis 07/11/2015   Abdominal wall hernia 07/11/2015   DM (diabetes mellitus),  type 2 with neurological complications (HCC)    Hyperlipidemia     ONSET DATE: 08/01/2021  REFERRING DIAG: CVA  THERAPY DIAG:  Muscle weakness (generalized)  Other lack of coordination  Rationale for Evaluation and Treatment Rehabilitation  SUBJECTIVE:   SUBJECTIVE STATEMENT:   Pt. reports that she left her Phone in the car.  Pt accompanied by: self  PERTINENT HISTORY:  Pt. Is a 76 y.o. female  presents with a diagnosis of RMCA CVA infarction with hemorrhagic transformation. Pt. Attended inpatient rehab  from 08/07/2021-08/22/2021. Pt. received Home health therapy services this past spring.  Pt. Has recently had an assessment through driver rehabilitation services at [facility] with recommendations for referrals for  outpatient OT/PT, and ST services. Pt. PMHx includes: HTN, Hyperlipidemia, Macular degeneration, DM, and Obesity.  PRECAUTIONS: None  WEIGHT BEARING RESTRICTIONS No  PAIN:  Are you having pain? No.    FALLS: Has patient fallen in last 6 months? Yes.  LIVING ENVIRONMENT: Lives with: lives with their family  Son  [name] Lives in: House/apartment Main living are on one floor Stairs:  2 steps to enter Has following equipment at home: Single point cane, Wheelchair (manual), shower chair, Grab bars, bed rail, and rubber mat.  PLOF: Independent  PATIENT GOALS: To be able to Drive, and cook again  OBJECTIVE:   HAND DOMINANCE: Right   ADLs: Overall ADLs:  Transfers/ambulation related to ADLs: Independent Eating: Independent Grooming: Pt. Fatigues with sustained BUE's in elevation for haircare. Especially with blow drying hair. UB Dressing: Independent pullover shirt, independent now with fastening a bra LB Dressing: Independent donning pants socks, and slide on shoes Toileting: Independent Bathing: Independent Tub Shower transfers: Walk-in shower Independent now   IADLs: Shopping: Needs to be accompanied to the grocery store. Light housekeeping: Does own laundry, laundry,  Meal Prep:  Difficulty with cooking Community mobility: Relies on son, and friend Medication management: Son sets up Mining engineer management: Manages monthly bills independently. Handwriting: No change from baseline  MOBILITY STATUS: Hx of falls out of bed   ACTIVITY TOLERANCE: Activity tolerance:  10-20 min. Before rest break  FUNCTIONAL OUTCOME MEASURES: FOTO: 57  TR score: 62  UPPER EXTREMITY ROM     Active ROM Right eval Left eval Left 05/06/2022 Left 05/27/2022  Shoulder flexion WFL  96(110) 107(110) 109(120)  Shoulder abduction WFL 82(105) 86(105) 92(115)  Shoulder adduction      Shoulder extension      Shoulder internal rotation      Shoulder external rotation      Elbow flexion [facility] WFL  Elbow extension [facility] WFL  Wrist flexion WFL 44(60) 68(68) WFL  Wrist extension WFL 32(60) 66(66) WFL  Wrist ulnar deviation      Wrist radial deviation      Wrist pronation      Wrist supination      (Blank rows = not tested)   UPPER EXTREMITY MMT:     MMT Right eval Left eval Left 05/06/2022 Left 05/27/2022  Shoulder flexion 4+/5 3-/5 3+/5 3+/5  Shoulder abduction 4+/5 3-/5 3-/5 3+/5  Shoulder adduction      Shoulder extension      Shoulder internal rotation      Shoulder external rotation      Middle trapezius      Lower trapezius      Elbow flexion 4+/5 4-/5 4+/5 5/5  Elbow extension 4+/5 4-/5 4+/5 5/5  Wrist flexion 4+/5 3-/5 4/5 4+/5  Wrist extension 4+/5 3/5  4/5 4+/5  Wrist ulnar deviation      Wrist radial deviation      Wrist pronation      Wrist supination      (Blank rows = not tested)  HAND FUNCTION: Grip strength: Right: 30 lbs; Left: 13 lbs, Lateral pinch: Right: 15 lbs, Left: 8 lbs, and 3 point pinch: Right: 13 lbs, Left: 7 lbs  05/06/2022 Grip strength: Right: 30 lbs; Left: 14 lbs, Lateral pinch: Right: 15 lbs, Left: 10 lbs, and 3 point pinch: Right: 13 lbs, Left: 7 lbs  05/27/2022:  Grip strength: Right: 30 lbs; Left: 17 lbs, Lateral pinch: Right: 15 lbs, Left: 11 lbs, and 3 point pinch: Right: 13 lbs, Left: 7 lbs  COORDINATION:  9 Hole Peg test: Right: 26  sec; Left: 1 min. & 9 sec  05/06/2022 9 Hole Peg test: Right: 26  sec; Left: 1 min.  05/27/2022  9 Hole Peg test: Right: 26  sec; Left: 1 min. 4 sec.  SENSATION: Light touch: WFL Proprioception: WFL  COGNITION: Overall cognitive status: Within functional limits for tasks assessed  VISION: Subjective report: Glasses. Just received a new prescription to  increase bifocal strength Baseline vision: Macular Degeneration Visual history: macular degeneration  VISION ASSESSMENT: To be further assessed in functional context  Left sided Awareness    PERCEPTION: Limited left sided awareness   TODAY'S TREATMENT:   Neuromuscular re-education:  Pt. worked on [facility] skills grasping 1" sticks, 1/4" collars, and 1/4" washers, then storing the objects in the palm of her hand, and translatory skills moving the items from her palm to the tip of the 2nd digit, and thumb. Pt. worked on removing the pegs using bilateral alternating hand patterns.   Pt. presented with more difficulty performing translatory movements with the smaller 1/4" collars. Pt. was able to move the sticks, and flat washers through his palm. Pt. continues to work on improving left hand strength and fine motor coordination skills in order to be able to hold objects more securely, cut meat, manipulate small objects, and maximize overall independence with ADLs and IADL tasks.      PATIENT EDUCATION: Education details: Kitchen safety/use of external memory aids to turn off burners, limit distractions when in kitchen Person educated: Patient Education method: verbal cues Education comprehension: verbalized understanding and pt does not plan to cook unless son is present.   HOME EXERCISE PROGRAM:  Continue with an ongoing assessment of HEP needs    GOALS: Goals reviewed with patient? Yes   SHORT TERM GOALS: Target date:  04/15/2022      Pt. Will improve FOTO score by 2 points to reflect improved pt. perceived functional performance Baseline: FOTO: 57, TR score: 62 10th visit: FOTO: 65, TR score: 62 Goal status: Achieved  LONG TERM GOALS: Target date: 08/19/2022    Pt. Will improve left UE strength by 2 mm grades to assist with repositioning her grandson on her lap. Baseline: Eval: left shoulder flexion: 3-/5, abduction 3-/5,  elbow flex/ext. 4-/5, wrist extension 3/5, wrist  flexion 3-/5  10th visit: left shoulder flexion: 3+/5, abduction 3-+5,  elbow flex/ext. 4+/5, wrist extension 4/5, wrist flexion 4/5 05/27/2022: left shoulder flexion: 3+/5, abduction 3+/5,  elbow flex/ext. 5/5, wrist extension 4+/5, wrist flexion 4+/5  Goal status: Ongoing  2.  Pt. will improve left grip strength by 5# to be able to independently hold and use a blow dryer, and brush for hair care. Baseline: Eval: Left grip strength 13#. Pt. Has difficulty performing hair  care, holding and using a brush, and blow dryer. 10th visit: Left grip 14#. Pt. Is now able to hold th e blow dryer for the sides, and top of head, not the back. 11/07: Left 17# Pt. Has difficulty blow drying then back of her head. Goal status: Ongoing  3.  Pt. Will improve left lateral pinch strength by 3# to be able to independently cut meat. Baseline: Eval: Left 8, Pt. Has difficulty cutting meat. 10th: Left: 10#.  Pt. Is able to cut tender meat, however is not as efficient 11/07: Left 11#. Pt. Is independent cutting meat with increased time. Goal status:  Achieved  4.  Pt. Will improve Left hand [facility] skills to be able to be able to independently, and efficiently manipulate small objects during ADL tasks. Baseline: Eval: left: 1 min. 9 sec. 10th visit: Left: 1 min. Pt. Is improving with manipulating small objects. 11/07: Left: 1 min. & 4 sec. Pt. Has difficulty manipulating small objects. Goal status: Ongoing   5.  Pt. Will perform light meal preparation with modified independence Baseline: Eval: Pt. Reports having difficulty performing light  meal preparation, and cooking tasks. 10th visit: Pt. Is able to make toast in the morning, a sandwich,  use the microwave, make coffee, and pour herself a drink. 11/07: Pt. Has difficulty holding a coffee pot when filling the reservoir Goal status: Ongoing  CLINICAL IMPRESSION:  Pt. presented with more difficulty performing translatory movements with the smaller 1/4" collars. Pt. was  able to move the sticks, and flat washers through his palm. Pt. continues to work on improving left hand strength and fine motor coordination skills in order to be able to hold objects more securely, cut meat, manipulate small objects, and maximize overall independence with ADLs and IADL tasks.     PERFORMANCE DEFICITS in functional skills including ADLs, IADLs, coordination, dexterity, sensation, ROM, strength, FMC, decreased knowledge of use of DME, vision, and UE functional use, cognitive skills including attention, and psychosocial skills including coping strategies, environmental adaptation, habits, and routines and behaviors.   IMPAIRMENTS are limiting patient from ADLs, IADLs, and social participation.   COMORBIDITIES may have co-morbidities  that affects occupational performance. Patient will benefit from skilled OT to address above impairments and improve overall function.  MODIFICATION OR ASSISTANCE TO COMPLETE EVALUATION: Min-Moderate modification of tasks or assist with assess necessary to complete an evaluation.  OT OCCUPATIONAL PROFILE AND HISTORY: Detailed assessment: Review of records and additional review of physical, cognitive, psychosocial history related to current functional performance.  CLINICAL DECISION MAKING: Moderate - several treatment options, min-mod task modification necessary  REHAB POTENTIAL: Good  EVALUATION COMPLEXITY: Moderate    PLAN: OT FREQUENCY: 2x/week  OT DURATION: 12 weeks  PLANNED INTERVENTIONS: self care/ADL training, therapeutic exercise, therapeutic activity, neuromuscular re-education, manual therapy, passive range of motion, and paraffin  RECOMMENDED OTHER SERVICES: ST, and PT  CONSULTED AND AGREED WITH PLAN OF CARE: Patient  PLAN FOR NEXT SESSION:  L hand strengthening and coordination exercises  Olegario Messier, MS, OTR/L   Olegario Messier, MS, OTR/L

## 2022-06-09 NOTE — Therapy (Signed)
OUTPATIENT PHYSICAL THERAPY NEURO TREATMENT    Patient Name: Alyssa Mayo MRN: [id number] DOB:1946-06-27, 76 y.o., female Today's Date: 06/09/2022   PCP: [place] Pink, MD REFERRING PROVIDER: Frann Rider, NP   PT End of Session - 06/09/22 1336     Visit Number 16    Number of Visits 25    Date for PT Re-Evaluation 06/25/22    Authorization Type [employer]    Authorization Time Period 04/02/2022-06/25/2022    Progress Note Due on Visit 20    PT Start Time 1101    PT Stop Time 1145    PT Time Calculation (min) 44 min    Equipment Utilized During Treatment Gait belt    Activity Tolerance Patient tolerated treatment well;No increased pain    Behavior During Therapy [facility] for tasks assessed/performed                Past Medical History:  Diagnosis Date   Abnormal levels of other serum enzymes 07/17/2015   Arthritis    Constipation 07/11/2015   COVID-19 03/06/2021   Diabetes mellitus without complication ([place])    Elevated liver enzymes    Fatty liver    Generalized abdominal pain 07/11/2015   Hyperlipidemia    Hypertension    Lifelong obesity    Macular degeneration    Morbid obesity due to excess calories ([place]) 07/11/2015   Other fatigue 07/11/2015   Sleep apnea    Spinal headache    Past Surgical History:  Procedure Laterality Date   ABDOMINAL HYSTERECTOMY     APPENDECTOMY     CATARACT EXTRACTION     CERVICAL LAMINECTOMY  07/21/1985   CESAREAN SECTION     COLONOSCOPY  07/21/2012   diverticulosis, [facility] Dr. Donnella Sham    COLONOSCOPY WITH PROPOFOL N/A 02/04/2019   Procedure: COLONOSCOPY WITH PROPOFOL;  Surgeon: Lollie Sails, MD;  Location: [facility] ENDOSCOPY;  Service: Endoscopy;  Laterality: N/A;   COLONOSCOPY WITH PROPOFOL N/A 03/18/2021   Procedure: COLONOSCOPY WITH PROPOFOL;  Surgeon: Lesly Rubenstein, MD;  Location: [facility] ENDOSCOPY;  Service: Endoscopy;  Laterality: N/A;  DM   DIAGNOSTIC LAPAROSCOPY     ESOPHAGOGASTRODUODENOSCOPY (EGD) WITH  PROPOFOL N/A 02/04/2019   Procedure: ESOPHAGOGASTRODUODENOSCOPY (EGD) WITH PROPOFOL;  Surgeon: Lollie Sails, MD;  Location: [name] ENDOSCOPY;  Service: Endoscopy;  Laterality: N/A;   ESOPHAGOGASTRODUODENOSCOPY (EGD) WITH PROPOFOL N/A 03/18/2021   Procedure: ESOPHAGOGASTRODUODENOSCOPY (EGD) WITH PROPOFOL;  Surgeon: Lesly Rubenstein, MD;  Location: [facility] ENDOSCOPY;  Service: Endoscopy;  Laterality: N/A;   LOOP RECORDER INSERTION N/A 08/05/2021   Procedure: LOOP RECORDER INSERTION;  Surgeon: Evans Lance, MD;  Location: [place] CV LAB;  Service: Cardiovascular;  Laterality: N/A;   [place]     TUBAL LIGATION     Patient Active Problem List   Diagnosis Date Noted   Cerebral edema ([place]) 08/07/2021   OSA (obstructive sleep apnea) 08/07/2021   Right middle cerebral artery stroke ([place]) 08/07/2021   CVA (cerebral vascular accident) ([place]) 08/01/2021   GERD (gastroesophageal reflux disease) 07/20/2019   Advanced care planning/counseling discussion 12/17/2016   Skin lesions, generalized 11/13/2016   Chronic right hip pain 06/17/2016   Vitamin D deficiency 09/26/2015   Diastasis recti 08/08/2015   Elevated serum GGT level 07/24/2015   Essential hypertension 07/17/2015   Fatty liver 07/17/2015   Elevated alkaline phosphatase level 07/17/2015   Elevated serum glutamic pyruvic transaminase (SGPT) level 07/17/2015   Abnormal finding on EKG 07/11/2015   Morbid obesity ([place]) 07/11/2015  Colon, diverticulosis 07/11/2015   Abdominal wall hernia 07/11/2015   DM (diabetes mellitus), type 2 with neurological complications [facility])    Hyperlipidemia     ONSET DATE: January 11th 2023  REFERRING DIAG:  I69.398,R26.9 (ICD-10-CM) - Gait disturbance, post-stroke  I63.511 (ICD-10-CM) - Right middle cerebral artery stroke (HCC)    THERAPY DIAG:  Difficulty in walking, not elsewhere classified  Muscle weakness (generalized)  Other lack of coordination  Unsteadiness on feet  Rationale  for Evaluation and Treatment Rehabilitation  SUBJECTIVE:                                                                                                                                                                                              SUBJECTIVE STATEMENT: Pt notes she did her exercises while in [place] visiting her granddaughter for her 5th birthday party.  She notes she did not get into the bouncy house.     Pt accompanied by: self  PERTINENT HISTORY: Per chart and confirmed by pt:  Pt is a 76 yo female with RMCA CVA infarction with hemorrhagic transformation 08/01/2021, with inpatient rehab 08/07/2021-08/22/2021 followed by [facility] therapy until June. Pt now currently being seen for outpatient OT and ST services. Pt reports limitations in stamina persist. Other PMH is significant for HTN, HLD, macular degeneration, DM, obesity, sleep apnea, arthritis, chronic R hip pain, vitamin D deficiency, diastasis recti, abdominal wall hernia, hx of abdominal hysterectomy, appendectomy, spine surgery (years ago).   PAIN:  Are you having pain? No Pain Location:     TODAY'S TREATMENT:   Neuro:  Ambulation laps around the gym with cognition tasks and continuous walking for 25 minutes without any necessary rest breaks   NBOS on Airex pad while playing hangman, x8 minutes NBOS on Airex pad while playing tic-tac-toe, x8 minutes    PATIENT EDUCATION: Education details: exam findings, indications, plan, HEP Person educated: Patient Education method: Consulting civil engineer, Demonstration, Verbal cues, and Handouts Education comprehension: verbalized understanding, returned demonstration, verbal cues required, and needs further education   HOME EXERCISE PROGRAM: Access Code: [email or web address] URL: [email or web address] Date: 06/02/2022 Prepared by: [name]  Exercises - Standing Single Leg Stance with Counter Support  - 2 x daily - 7 x weekly - 1 sets - 10 reps - 10s  hold - Standing Marching   - 2 x daily - 7 x weekly - 2 sets - 20 reps - Side Stepping with Counter Support  - 2 x daily - 7 x weekly - 2 sets - 20 reps - Walking  - 2 x daily - 7 x weekly    GOALS: Goals reviewed with  patient? Yes  SHORT TERM GOALS: Target date: 07/21/2022  Patient will be independent in home exercise program to improve strength/mobility for better functional independence with ADLs. Baseline: initiated Goal status: INITIAL   LONG TERM GOALS: Target date: 09/01/2022  Patient will increase FOTO score to equal to or greater than 65  to demonstrate improvement in mobility and quality of life.  Baseline: 57 Goal status: INITIAL  2.  Patient (> 66 years old) will complete five times sit to stand test in < 15 seconds indicating an increased LE strength and improved balance. Baseline: 17 sec hands-free; 11/1: 15.36 sec Goal status: IN-PROGRESS  3.  Patient will increase 10 meter walk test to >1.51ms as to improve gait speed for better community ambulation and to reduce fall risk. Baseline: 0.66 m/s; 11/1:  0.85 m/s Goal status: IN-PROGRESS  4.  Patient will increase Berg Balance score by > 3 points to demonstrate decreased fall risk during functional activities. Baseline: 50/56 Goal status: INITIAL  5.  Patient will increase six minute walk test distance to >1000 for progression to community ambulator and improve gait ability Baseline: 04/10/22: 6725f(pain onset at 5 minutes); 05/21/22: 1027 ft Goal status: MET    ASSESSMENT:  CLINICAL IMPRESSION:  Pt performed well with the cognitive tasks during ambulation and at the whiteboard.  Pt is making steady improvements with her balance and cognitive function while performing tasks as well.  Pt able to tolerate upright activities for prolonged periods of time without any increase in pain or fatigue.   Pt will continue to benefit from skilled therapy to address remaining deficits in order to improve overall QoL and return to PLOF.        OBJECTIVE IMPAIRMENTS Abnormal gait, decreased activity tolerance, decreased balance, decreased coordination, decreased endurance, decreased mobility, difficulty walking, decreased strength, impaired sensation, impaired UE functional use, improper body mechanics, postural dysfunction, and pain.   ACTIVITY LIMITATIONS carrying, lifting, bending, standing, squatting, stairs, bed mobility, bathing, reach over head, hygiene/grooming, and locomotion level  PARTICIPATION LIMITATIONS: meal prep, cleaning, laundry, medication management, personal finances, driving, shopping, community activity, and yard work  PERSONAL FACTORS Age, Sex, Time since onset of injury/illness/exacerbation, and 3+ comorbidities: Other PMH is significant for HTN, HLD, macular degeneration, DM, obesity, sleep apnea, arthritis, chronic R hip pain, vitamin D deficiency, diastasis recti, abdominal wall hernia, hx of abdominal hysterectomy, appendectomy, spine surgery (years ago).   are also affecting patient's functional outcome.   REHAB POTENTIAL: Good  CLINICAL DECISION MAKING: Evolving/moderate complexity  EVALUATION COMPLEXITY: Moderate  PLAN: PT FREQUENCY: 2x/week  PT DURATION: 12 weeks  PLANNED INTERVENTIONS: Therapeutic exercises, Therapeutic activity, Neuromuscular re-education, Balance training, Gait training, Patient/Family education, Self Care, Joint mobilization, Joint manipulation, Stair training, Vestibular training, Canalith repositioning, Orthotic/Fit training, DME instructions, Dry Needling, Electrical stimulation, Wheelchair mobility training, Spinal mobilization, Cryotherapy, Moist heat, Splintting, Taping, Traction, Ultrasound, Biofeedback, Manual therapy, and Re-evaluation  PLAN FOR NEXT SESSION:   HEP compliance and introduce more cognitive related tasks.   JoGwenlyn SaranPT, DPT Physical Therapist- CoGastro [facility] LLC11/20/23, 1:45 PM

## 2022-06-10 ENCOUNTER — Encounter: Payer: Medicare HMO | Admitting: Occupational Therapy

## 2022-06-17 ENCOUNTER — Ambulatory Visit: Payer: Medicare HMO

## 2022-06-17 ENCOUNTER — Ambulatory Visit: Payer: Medicare HMO | Admitting: Occupational Therapy

## 2022-06-17 ENCOUNTER — Ambulatory Visit: Payer: Medicare HMO | Admitting: Speech Pathology

## 2022-06-17 DIAGNOSIS — M6281 Muscle weakness (generalized): Secondary | ICD-10-CM

## 2022-06-17 DIAGNOSIS — R41841 Cognitive communication deficit: Secondary | ICD-10-CM | POA: Diagnosis not present

## 2022-06-17 DIAGNOSIS — R278 Other lack of coordination: Secondary | ICD-10-CM

## 2022-06-17 DIAGNOSIS — R262 Difficulty in walking, not elsewhere classified: Secondary | ICD-10-CM

## 2022-06-17 DIAGNOSIS — I63511 Cerebral infarction due to unspecified occlusion or stenosis of right middle cerebral artery: Secondary | ICD-10-CM

## 2022-06-17 DIAGNOSIS — R2681 Unsteadiness on feet: Secondary | ICD-10-CM

## 2022-06-17 NOTE — Therapy (Signed)
OUTPATIENT SPEECH LANGUAGE PATHOLOGY TREATMENT NOTE    Patient Name: Alyssa Mayo MRN: [id number] DOB:04/03/1946, 76 y.o., female Today's Date: 06/17/2022     PCP: James Hedrick, MD REFERRING PROVIDER: Jessica McCue, NP   End of Session - 06/17/22 1017     Visit Number 18    Number of Visits 27    Date for SLP Re-Evaluation 08/21/22    Authorization Type [employer] HMO/PPO    Progress Note Due on Visit 20    SLP Start Time 1015    SLP Stop Time  1100    SLP Time Calculation (min) 45 min    Activity Tolerance Patient tolerated treatment well                  Past Medical History:  Diagnosis Date   Abnormal levels of other serum enzymes 07/17/2015   Arthritis    Constipation 07/11/2015   COVID-19 03/06/2021   Diabetes mellitus without complication (HCC)    Elevated liver enzymes    Fatty liver    Generalized abdominal pain 07/11/2015   Hyperlipidemia    Hypertension    Lifelong obesity    Macular degeneration    Morbid obesity due to excess calories (HCC) 07/11/2015   Other fatigue 07/11/2015   Sleep apnea    Spinal headache    Past Surgical History:  Procedure Laterality Date   ABDOMINAL HYSTERECTOMY     APPENDECTOMY     CATARACT EXTRACTION     CERVICAL LAMINECTOMY  07/21/1985   CESAREAN SECTION     COLONOSCOPY  07/21/2012   diverticulosis, [facility] Dr. Skulski    COLONOSCOPY WITH PROPOFOL N/A 02/04/2019   Procedure: COLONOSCOPY WITH PROPOFOL;  Surgeon: Skulskie, Martin U, MD;  Location: [facility] ENDOSCOPY;  Service: Endoscopy;  Laterality: N/A;   COLONOSCOPY WITH PROPOFOL N/A 03/18/2021   Procedure: COLONOSCOPY WITH PROPOFOL;  Surgeon: Locklear, Cameron T, MD;  Location: [facility] ENDOSCOPY;  Service: Endoscopy;  Laterality: N/A;  DM   DIAGNOSTIC LAPAROSCOPY     ESOPHAGOGASTRODUODENOSCOPY (EGD) WITH PROPOFOL N/A 02/04/2019   Procedure: ESOPHAGOGASTRODUODENOSCOPY (EGD) WITH PROPOFOL;  Surgeon: Skulskie, Martin U, MD;  Location: [facility] ENDOSCOPY;  Service:  Endoscopy;  Laterality: N/A;   ESOPHAGOGASTRODUODENOSCOPY (EGD) WITH PROPOFOL N/A 03/18/2021   Procedure: ESOPHAGOGASTRODUODENOSCOPY (EGD) WITH PROPOFOL;  Surgeon: Locklear, Cameron T, MD;  Location: [facility] ENDOSCOPY;  Service: Endoscopy;  Laterality: N/A;   LOOP RECORDER INSERTION N/A 08/05/2021   Procedure: LOOP RECORDER INSERTION;  Surgeon: Taylor, Gregg W, MD;  [facility];  Service: Cardiovascular;  Laterality: N/A;   SPINE SURGERY     TUBAL LIGATION     Patient Active Problem List   Diagnosis Date Noted   Cerebral edema (HCC) 08/07/2021   OSA (obstructive sleep apnea) 08/07/2021   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   CVA (cerebral vascular accident) (HCC) 08/01/2021   GERD (gastroesophageal reflux disease) 07/20/2019   Advanced care planning/counseling discussion 12/17/2016   Skin lesions, generalized 11/13/2016   Chronic right hip pain 06/17/2016   Vitamin D deficiency 09/26/2015   Diastasis recti 08/08/2015   Elevated serum GGT level 07/24/2015   Essential hypertension 07/17/2015   Fatty liver 07/17/2015   Elevated alkaline phosphatase level 07/17/2015   Elevated serum glutamic pyruvic transaminase (SGPT) level 07/17/2015   Abnormal finding on EKG 07/11/2015   Morbid obesity (HCC) 07/11/2015   Colon, diverticulosis 07/11/2015   Abdominal wall hernia 07/11/2015   DM (diabetes mellitus), type 2 with neurological complications (HCC)      Hyperlipidemia     ONSET DATE: 08/01/2021 CVA; 02/26/2022 date of referral   REFERRING DIAG: I69.319 (ICD-10-CM) - Cognitive deficit, post-stroke I63.511 (ICD-10-CM) - Right middle cerebral artery stroke [facility])     PERTINENT HISTORY: Alyssa Mayo is a 76 y.o. female with history of diabetes, hypertension, hyperlipidemia, obesity, and sleep apnea who presented to [facility] ED on 08/01/2021 after being found down by her son. Her last know well was approximately 07/29/2021. Pt transferred to [facility] for further CVA  management. In addition, pt received ST services in CIR from 08/08/2021 thru 08/21/2021.      DIAGNOSTIC FINDINGS: MRI showed extensive restricted diffusion throughout much of the right MCA territory with associated changes of hemorrhagic transformation and localized cerebral edema and mass effect. Per Dr. Leonie Man, felt embolic pattern secondary to unclear source although concerning for occult A fib. Repeat [facility] 1/14 showed no interval progression of right MCA hemorrhagic infarct with petechial hemorrhage.  CTA head/neck showed right M2 thrombus.  EF 60 to 65%.   THERAPY DIAG:  Cognitive communication deficit  Right middle cerebral artery stroke [facility])  Rationale for Evaluation and Treatment Rehabilitation  SUBJECTIVE: pt arrived late to session as appt with eye doctor ran over  Pt accompanied by: self  PAIN:  Are you having pain? No  PATIENT GOALS to be able to drive again and live independently (she currently lives alone but her son plans on moving in with her)  OBJECTIVE:   TODAY'S TREATMENT: Skilled treatment session focused on pt's cognitive communication goals. SLP facilitated the session by providing the following interventions:  While pt reports that she has been "deleting" a lot of emails and she only has "28 emails," pt actually has 28 unread emails from earlier in the month and hundreds of emails dating back to beginning of 2022.   With moderate assistance, pt able to select old emails and delete to aid in pt's ability to find emails containing bills  "Um, I didn't realize I got that" update from painter   PATIENT EDUCATION: Education details: Animator; directional awareness for navigating the hallways Person educated: Patient and Child(ren) Education method: Explanation, Demonstration, Verbal cues, and Handouts Education comprehension: needs further education  HOME EXERCISE PROGRAM  Updating email - deleting and un-scribing emails  GOALS: Goals  reviewed with patient? Yes  REPORTING PERIOD 03/04/2022 to 05/13/2022 SHORT TERM GOALS: Target date: 10 sessions   With moderate assistance, pt will recall her medicines with 80% accuracy.  Baseline: currently unable Goal status: MET; 91% recall independently   2.  With minimal assistance, pt will complete basic medication management task with > 90% accuracy.  Baseline: total Goal status: ONGOING; currently requiring moderate to minimal assistance    3.  Pt will demonstrate selective attention to basic task for 15 minutes with < 2 redirections.  Baseline: moderate assistance Goal status: ONGOING; currently requiring minimal to supervision assistance   4.  Pt will complete basic bill paying task with 75% accuracy.  Baseline: total Goal status: ONGOING; currently at 50% with minimal assistance  5. With minimal assistance, pt will use strategies to complete semi-complex visuospatial tasks with 85% accuracy.   Baseline: moderate  Goal status: INITIAL; ONGOING  6. Pt will require < 2 cues to initiate leaving session in 5 out of 6 opportunities.   Baseline: 6 cues  Goal status: INITIAL; ONGOING     LONG TERM GOALS: Target date: 05/27/2022 With rare min A, pt will organize her medicines accurately in a pill  organizer.  Baseline: Total Goal status: ONGOING   2.  With rare min A, pt will demonstrate selective attention to task for 30 minutes.  Baseline: moderate Goal status: ONGOING   3.  With rare min A, pt will sequence basic meal prep activity.  Baseline: Moderate Goal status: ONGOING      ASSESSMENT:  CLINICAL IMPRESSION: Given pt's right hemisphere disorder, prognosis is quarded request pt's son come to sessions 1xweek to improve   OBJECTIVE IMPAIRMENTS include attention and executive functioning. These impairments are limiting patient from managing medications, managing appointments, managing finances, household responsibilities, and ADLs/IADLs. Factors affecting  potential to achieve goals and functional outcome are ability to learn/carryover information. Patient will benefit from skilled SLP services to address above impairments and improve overall function.  REHAB POTENTIAL: Good  PLAN: SLP FREQUENCY: 1-2x/week  SLP DURATION: 12 weeks  PLANNED INTERVENTIONS: Functional tasks, SLP instruction and feedback, and Patient/family education    B. , M.S., CCC-SLP, CBIS Speech-Language Pathologist Certified Brain Injury Specialist [place]  [facility] Rehabilitation Services Office [phone] Ascom [phone] Fax [phone] 

## 2022-06-17 NOTE — Therapy (Signed)
OCCUPATIONAL THERAPY TREATMENT NOTE  Patient Name: Alyssa Mayo MRN: [id number] DOB:01/25/1946, 75 y.o., female Today's Date: 03/14/2022  PCP: [name], MD REFERRING PROVIDER: [name], NP    OT End of Session - 06/17/22 1149     Visit Number 19    Number of Visits 24    Date for OT Re-Evaluation 08/19/22    Authorization Type Progress reporting period starting 03/04/2022    OT Start Time 1103    OT Stop Time 1145    OT Time Calculation (min) 42 min    Activity Tolerance Patient tolerated treatment well    Behavior During Therapy [facility] for tasks assessed/performed                       Past Medical History:  Diagnosis Date   Abnormal levels of other serum enzymes 07/17/2015   Arthritis    Constipation 07/11/2015   COVID-19 03/06/2021   Diabetes mellitus without complication (HCC)    Elevated liver enzymes    Fatty liver    Generalized abdominal pain 07/11/2015   Hyperlipidemia    Hypertension    Lifelong obesity    Macular degeneration    Morbid obesity due to excess calories (HCC) 07/11/2015   Other fatigue 07/11/2015   Sleep apnea    Spinal headache    Past Surgical History:  Procedure Laterality Date   ABDOMINAL HYSTERECTOMY     APPENDECTOMY     CATARACT EXTRACTION     CERVICAL LAMINECTOMY  07/21/1985   CESAREAN SECTION     COLONOSCOPY  07/21/2012   diverticulosis, [facility] Dr. [name]    COLONOSCOPY WITH PROPOFOL N/A 02/04/2019   Procedure: COLONOSCOPY WITH PROPOFOL;  Surgeon: [name], MD;  Location: [facility] ENDOSCOPY;  Service: Endoscopy;  Laterality: N/A;   COLONOSCOPY WITH PROPOFOL N/A 03/18/2021   Procedure: COLONOSCOPY WITH PROPOFOL;  Surgeon: [name], MD;  Location: [facility] ENDOSCOPY;  Service: Endoscopy;  Laterality: N/A;  DM   DIAGNOSTIC LAPAROSCOPY     ESOPHAGOGASTRODUODENOSCOPY (EGD) WITH PROPOFOL N/A 02/04/2019   Procedure: ESOPHAGOGASTRODUODENOSCOPY (EGD) WITH PROPOFOL;  Surgeon: [name],  MD;  Location: [facility] ENDOSCOPY;  Service: Endoscopy;  Laterality: N/A;   ESOPHAGOGASTRODUODENOSCOPY (EGD) WITH PROPOFOL N/A 03/18/2021   Procedure: ESOPHAGOGASTRODUODENOSCOPY (EGD) WITH PROPOFOL;  Surgeon: [name], MD;  Location: [facility] ENDOSCOPY;  Service: Endoscopy;  Laterality: N/A;   LOOP RECORDER INSERTION N/A 08/05/2021   Procedure: LOOP RECORDER INSERTION;  Surgeon: [name], MD;  [facility] INVASIVE CV LAB;  Service: Cardiovascular;  Laterality: N/A;   SPINE SURGERY     TUBAL LIGATION     Patient Active Problem List   Diagnosis Date Noted   Cerebral edema (HCC) 08/07/2021   OSA (obstructive sleep apnea) 08/07/2021   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   CVA (cerebral vascular accident) (HCC) 08/01/2021   GERD (gastroesophageal reflux disease) 07/20/2019   Advanced care planning/counseling discussion 12/17/2016   Skin lesions, generalized 11/13/2016   Chronic right hip pain 06/17/2016   Vitamin D deficiency 09/26/2015   Diastasis recti 08/08/2015   Elevated serum GGT level 07/24/2015   Essential hypertension 07/17/2015   Fatty liver 07/17/2015   Elevated alkaline phosphatase level 07/17/2015   Elevated serum glutamic pyruvic transaminase (SGPT) level 07/17/2015   Abnormal finding on EKG 07/11/2015   Morbid obesity (HCC) 07/11/2015   Colon, diverticulosis 07/11/2015   Abdominal wall hernia 07/11/2015   DM (diabetes mellitus),  type 2 with neurological complications (HCC)    Hyperlipidemia     ONSET DATE: 08/01/2021  REFERRING DIAG: CVA  THERAPY DIAG:  Muscle weakness (generalized)  Other lack of coordination  Rationale for Evaluation and Treatment Rehabilitation  SUBJECTIVE:   SUBJECTIVE STATEMENT:   Pt. reports that she left her Phone in the car.  Pt accompanied by: self  PERTINENT HISTORY:  Pt. Is a 76 y.o. female  presents with a diagnosis of RMCA CVA infarction with hemorrhagic transformation. Pt. Attended inpatient rehab  from 08/07/2021-08/22/2021. Pt. received Home health therapy services this past spring.  Pt. Has recently had an assessment through driver rehabilitation services at [facility] with recommendations for referrals for  outpatient OT/PT, and ST services. Pt. PMHx includes: HTN, Hyperlipidemia, Macular degeneration, DM, and Obesity.  PRECAUTIONS: None  WEIGHT BEARING RESTRICTIONS No  PAIN:  Are you having pain? No.    FALLS: Has patient fallen in last 6 months? Yes.  LIVING ENVIRONMENT: Lives with: lives with their family  Son  Alyssa Mayo Lives in: House/apartment Main living are on one floor Stairs:  2 steps to enter Has following equipment at home: Single point cane, Wheelchair (manual), shower chair, Grab bars, bed rail, and rubber mat.  PLOF: Independent  PATIENT GOALS: To be able to Drive, and cook again  OBJECTIVE:   HAND DOMINANCE: Right   ADLs: Overall ADLs:  Transfers/ambulation related to ADLs: Independent Eating: Independent Grooming: Pt. Fatigues with sustained BUE's in elevation for haircare. Especially with blow drying hair. UB Dressing: Independent pullover shirt, independent now with fastening a bra LB Dressing: Independent donning pants socks, and slide on shoes Toileting: Independent Bathing: Independent Tub Shower transfers: Walk-in shower Independent now   IADLs: Shopping: Needs to be accompanied to the grocery store. Light housekeeping: Does own laundry, laundry,  Meal Prep:  Difficulty with cooking Community mobility: Relies on son, and friend Medication management: Son sets up Mining engineer management: Manages monthly bills independently. Handwriting: No change from baseline  MOBILITY STATUS: Hx of falls out of bed   ACTIVITY TOLERANCE: Activity tolerance:  10-20 min. Before rest break  FUNCTIONAL OUTCOME MEASURES: FOTO: 57  TR score: 62  UPPER EXTREMITY ROM     Active ROM Right eval Left eval Left 05/06/2022 Left 05/27/2022  Shoulder flexion WFL  96(110) 107(110) 109(120)  Shoulder abduction WFL 82(105) 86(105) 92(115)  Shoulder adduction      Shoulder extension      Shoulder internal rotation      Shoulder external rotation      Elbow flexion [facility] WFL  Elbow extension [facility] WFL  Wrist flexion WFL 44(60) 68(68) WFL  Wrist extension WFL 32(60) 66(66) WFL  Wrist ulnar deviation      Wrist radial deviation      Wrist pronation      Wrist supination      (Blank rows = not tested)   UPPER EXTREMITY MMT:     MMT Right eval Left eval Left 05/06/2022 Left 05/27/2022  Shoulder flexion 4+/5 3-/5 3+/5 3+/5  Shoulder abduction 4+/5 3-/5 3-/5 3+/5  Shoulder adduction      Shoulder extension      Shoulder internal rotation      Shoulder external rotation      Middle trapezius      Lower trapezius      Elbow flexion 4+/5 4-/5 4+/5 5/5  Elbow extension 4+/5 4-/5 4+/5 5/5  Wrist flexion 4+/5 3-/5 4/5 4+/5  Wrist extension 4+/5 3/5  4/5 4+/5  Wrist ulnar deviation      Wrist radial deviation      Wrist pronation      Wrist supination      (Blank rows = not tested)  HAND FUNCTION: Grip strength: Right: 30 lbs; Left: 13 lbs, Lateral pinch: Right: 15 lbs, Left: 8 lbs, and 3 point pinch: Right: 13 lbs, Left: 7 lbs  05/06/2022 Grip strength: Right: 30 lbs; Left: 14 lbs, Lateral pinch: Right: 15 lbs, Left: 10 lbs, and 3 point pinch: Right: 13 lbs, Left: 7 lbs  05/27/2022:  Grip strength: Right: 30 lbs; Left: 17 lbs, Lateral pinch: Right: 15 lbs, Left: 11 lbs, and 3 point pinch: Right: 13 lbs, Left: 7 lbs  COORDINATION:  9 Hole Peg test: Right: 26  sec; Left: 1 min. & 9 sec  05/06/2022 9 Hole Peg test: Right: 26  sec; Left: 1 min.  05/27/2022  9 Hole Peg test: Right: 26  sec; Left: 1 min. 4 sec.  SENSATION: Light touch: WFL Proprioception: WFL  COGNITION: Overall cognitive status: Within functional limits for tasks assessed  VISION: Subjective report: Glasses. Just received a new prescription to  increase bifocal strength Baseline vision: Macular Degeneration Visual history: macular degeneration  VISION ASSESSMENT: To be further assessed in functional context  Left sided Awareness    PERCEPTION: Limited left sided awareness   TODAY'S TREATMENT:   Therapeutic Exercise:  Pt. Worked on the [employer] for 5 min. With constant monitoring of the BUEs. Pt. worked on changing, and alternating forward reverse position every 2 min. Pt. worked on Left UE AAROM shoulder flexion, and abduction at the tabletop surface. Pt. Performed 2# dumbbell ex. for elbow flexion and extension in standing, forearm supination/pronation, 1# wrist flexion/extension, and radial deviation seated with forearm stabilized. Pt. requires rest breaks and verbal cues for proper technique.   Pt. reports having experienced left shoulder pain yesterday when she reached out to place a drink into the cup holder in the car. Pt. Tolerated the UE exercises with minimal cues for form and technique. Pt. Required cues for visual demonstration of each exercise. Pt. continues to work on improving left hand strength and fine motor coordination skills in order to be able to hold objects more securely, cut meat, manipulate small objects, and maximize overall independence with ADLs and IADL tasks.      PATIENT EDUCATION: Education details: Kitchen safety/use of external memory aids to turn off burners, limit distractions when in kitchen Person educated: Patient Education method: verbal cues Education comprehension: verbalized understanding and pt does not plan to cook unless son is present.   HOME EXERCISE PROGRAM:  Continue with an ongoing assessment of HEP needs    GOALS: Goals reviewed with patient? Yes   SHORT TERM GOALS: Target date:  04/15/2022      Pt. Will improve FOTO score by 2 points to reflect improved pt. perceived functional performance Baseline: FOTO: 57, TR score: 62 10th visit: FOTO: 65, TR score: 62 Goal  status: Achieved  LONG TERM GOALS: Target date: 08/19/2022    Pt. Will improve left UE strength by 2 mm grades to assist with repositioning her grandson on her lap. Baseline: Eval: left shoulder flexion: 3-/5, abduction 3-/5,  elbow flex/ext. 4-/5, wrist extension 3/5, wrist flexion 3-/5  10th visit: left shoulder flexion: 3+/5, abduction 3-+5,  elbow flex/ext. 4+/5, wrist extension 4/5, wrist flexion 4/5 05/27/2022: left shoulder flexion: 3+/5, abduction 3+/5,  elbow flex/ext. 5/5, wrist extension 4+/5, wrist flexion 4+/5  Goal  status: Ongoing  2.  Pt. will improve left grip strength by 5# to be able to independently hold and use a blow dryer, and brush for hair care. Baseline: Eval: Left grip strength 13#. Pt. Has difficulty performing hair care, holding and using a brush, and blow dryer. 10th visit: Left grip 14#. Pt. Is now able to hold th e blow dryer for the sides, and top of head, not the back. 11/07: Left 17# Pt. Has difficulty blow drying then back of her head. Goal status: Ongoing  3.  Pt. Will improve left lateral pinch strength by 3# to be able to independently cut meat. Baseline: Eval: Left 8, Pt. Has difficulty cutting meat. 10th: Left: 10#.  Pt. Is able to cut tender meat, however is not as efficient 11/07: Left 11#. Pt. Is independent cutting meat with increased time. Goal status:  Achieved  4.  Pt. Will improve Left hand [facility] skills to be able to be able to independently, and efficiently manipulate small objects during ADL tasks. Baseline: Eval: left: 1 min. 9 sec. 10th visit: Left: 1 min. Pt. Is improving with manipulating small objects. 11/07: Left: 1 min. & 4 sec. Pt. Has difficulty manipulating small objects. Goal status: Ongoing   5.  Pt. Will perform light meal preparation with modified independence Baseline: Eval: Pt. Reports having difficulty performing light  meal preparation, and cooking tasks. 10th visit: Pt. Is able to make toast in the morning, a sandwich,  use the  microwave, make coffee, and pour herself a drink. 11/07: Pt. Has difficulty holding a coffee pot when filling the reservoir Goal status: Ongoing  CLINICAL IMPRESSION:  Pt. reports having experienced left shoulder pain yesterday when she reached out to place a drink into the cup holder in the car. Pt. Tolerated the UE exercises with minimal cues for form and technique. Pt. Required cues for visual demonstration of each exercise. Pt. continues to work on improving left hand strength and fine motor coordination skills in order to be able to hold objects more securely, cut meat, manipulate small objects, and maximize overall independence with ADLs and IADL tasks.       PERFORMANCE DEFICITS in functional skills including ADLs, IADLs, coordination, dexterity, sensation, ROM, strength, FMC, decreased knowledge of use of DME, vision, and UE functional use, cognitive skills including attention, and psychosocial skills including coping strategies, environmental adaptation, habits, and routines and behaviors.   IMPAIRMENTS are limiting patient from ADLs, IADLs, and social participation.   COMORBIDITIES may have co-morbidities  that affects occupational performance. Patient will benefit from skilled OT to address above impairments and improve overall function.  MODIFICATION OR ASSISTANCE TO COMPLETE EVALUATION: Min-Moderate modification of tasks or assist with assess necessary to complete an evaluation.  OT OCCUPATIONAL PROFILE AND HISTORY: Detailed assessment: Review of records and additional review of physical, cognitive, psychosocial history related to current functional performance.  CLINICAL DECISION MAKING: Moderate - several treatment options, min-mod task modification necessary  REHAB POTENTIAL: Good  EVALUATION COMPLEXITY: Moderate    PLAN: OT FREQUENCY: 2x/week  OT DURATION: 12 weeks  PLANNED INTERVENTIONS: self care/ADL training, therapeutic exercise, therapeutic activity, neuromuscular  re-education, manual therapy, passive range of motion, and paraffin  RECOMMENDED OTHER SERVICES: ST, and PT  CONSULTED AND AGREED WITH PLAN OF CARE: Patient  PLAN FOR NEXT SESSION:  L hand strengthening and coordination exercises  Olegario Messier, MS, OTR/L   Olegario Messier, MS, OTR/L

## 2022-06-17 NOTE — Therapy (Signed)
OUTPATIENT PHYSICAL THERAPY NEURO TREATMENT    Patient Name: Alyssa Mayo MRN: [id number] DOB:[birth year], 76 y.o., female Today's Date: 06/18/2022   PCP: [place] Pink, MD REFERRING PROVIDER: Frann Rider, NP   PT End of Session - 06/17/22 1142     Visit Number 17    Number of Visits 25    Date for PT Re-Evaluation 06/25/22    Authorization Type [employer]    Authorization Time Period 04/02/2022-06/25/2022    Progress Note Due on Visit 20    PT Start Time 7371    PT Stop Time 1217    PT Time Calculation (min) 32 min    Equipment Utilized During Treatment Gait belt    Activity Tolerance Patient tolerated treatment well;No increased pain    Behavior During Therapy [facility] for tasks assessed/performed                Past Medical History:  Diagnosis Date   Abnormal levels of other serum enzymes 07/17/2015   Arthritis    Constipation 07/11/2015   COVID-19 03/06/2021   Diabetes mellitus without complication ([place])    Elevated liver enzymes    Fatty liver    Generalized abdominal pain 07/11/2015   Hyperlipidemia    Hypertension    Lifelong obesity    Macular degeneration    Morbid obesity due to excess calories ([employer]) 07/11/2015   Other fatigue 07/11/2015   Sleep apnea    Spinal headache    Past Surgical History:  Procedure Laterality Date   ABDOMINAL HYSTERECTOMY     APPENDECTOMY     CATARACT EXTRACTION     CERVICAL LAMINECTOMY  07/21/1985   CESAREAN SECTION     COLONOSCOPY  07/21/2012   diverticulosis, [facility] Dr. Donnella Sham    COLONOSCOPY WITH PROPOFOL N/A 02/04/2019   Procedure: COLONOSCOPY WITH PROPOFOL;  Surgeon: Lollie Sails, MD;  Location: [facility] ENDOSCOPY;  Service: Endoscopy;  Laterality: N/A;   COLONOSCOPY WITH PROPOFOL N/A 03/18/2021   Procedure: COLONOSCOPY WITH PROPOFOL;  Surgeon: Lesly Rubenstein, MD;  Location: [facility] ENDOSCOPY;  Service: Endoscopy;  Laterality: N/A;  DM   DIAGNOSTIC LAPAROSCOPY     ESOPHAGOGASTRODUODENOSCOPY (EGD) WITH  PROPOFOL N/A 02/04/2019   Procedure: ESOPHAGOGASTRODUODENOSCOPY (EGD) WITH PROPOFOL;  Surgeon: Lollie Sails, MD;  Location: [facility] ENDOSCOPY;  Service: Endoscopy;  Laterality: N/A;   ESOPHAGOGASTRODUODENOSCOPY (EGD) WITH PROPOFOL N/A 03/18/2021   Procedure: ESOPHAGOGASTRODUODENOSCOPY (EGD) WITH PROPOFOL;  Surgeon: Lesly Rubenstein, MD;  Location: [facility] ENDOSCOPY;  Service: Endoscopy;  Laterality: N/A;   LOOP RECORDER INSERTION N/A 08/05/2021   Procedure: LOOP RECORDER INSERTION;  Surgeon: Evans Lance, MD;  Location: [facility];  Service: Cardiovascular;  Laterality: N/A;   [place]     TUBAL LIGATION     Patient Active Problem List   Diagnosis Date Noted   Cerebral edema ([place]) 08/07/2021   OSA (obstructive sleep apnea) 08/07/2021   Right middle cerebral artery stroke ([place]) 08/07/2021   CVA (cerebral vascular accident) ([place]) 08/01/2021   GERD (gastroesophageal reflux disease) 07/20/2019   Advanced care planning/counseling discussion 12/17/2016   Skin lesions, generalized 11/13/2016   Chronic right hip pain 06/17/2016   Vitamin D deficiency 09/26/2015   Diastasis recti 08/08/2015   Elevated serum GGT level 07/24/2015   Essential hypertension 07/17/2015   Fatty liver 07/17/2015   Elevated alkaline phosphatase level 07/17/2015   Elevated serum glutamic pyruvic transaminase (SGPT) level 07/17/2015   Abnormal finding on EKG 07/11/2015   Morbid obesity ([place]) 07/11/2015  Colon, diverticulosis 07/11/2015   Abdominal wall hernia 07/11/2015   DM (diabetes mellitus), type 2 with neurological complications [facility])    Hyperlipidemia     ONSET DATE: January 11th 2023  REFERRING DIAG:  I69.398,R26.9 (ICD-10-CM) - Gait disturbance, post-stroke  I63.511 (ICD-10-CM) - Right middle cerebral artery stroke (HCC)    THERAPY DIAG:  Right middle cerebral artery stroke (HCC)  Difficulty in walking, not elsewhere classified  Muscle weakness (generalized)  Other lack of  coordination  Unsteadiness on feet  Rationale for Evaluation and Treatment Rehabilitation  SUBJECTIVE:        Patient reports doing okay. States she received a shot in the eye for her macular degeneration earlier this morning.                                                                                                                                                                                   SUBJECTIVE STATEMENT:  Pt accompanied by: self  PERTINENT HISTORY: Per chart and confirmed by pt:  Pt is a 76 yo female with RMCA CVA infarction with hemorrhagic transformation 08/01/2021, with inpatient rehab 08/07/2021-08/22/2021 followed by [facility] therapy until June. Pt now currently being seen for outpatient OT and ST services. Pt reports limitations in stamina persist. Other PMH is significant for HTN, HLD, macular degeneration, DM, obesity, sleep apnea, arthritis, chronic R hip pain, vitamin D deficiency, diastasis recti, abdominal wall hernia, hx of abdominal hysterectomy, appendectomy, spine surgery (years ago).   PAIN:  Are you having pain? No Pain Location:     TODAY'S TREATMENT:   Neuro: - Ambulation in hallways of hospital with PT leading the 1st 1/2 way then patient had to recall the path and lead herself and PT back to PT gym. Patient able to walk > 800 feet and 100% recall with pathway today.   Staggered stand - front foot on 6" step and back foot on airex pad x 30 sec x 2 (mild initial unsteadiness - much improved on second set  Tandem gait using 2x4 board in // bars x 6 down and back with 1UE and progressed to no UE support.   THEREX:   Lunge squat in // bars - down and back x 4.  Calf raises from 1/2 foam roll x 15 reps  Side step over 1/2 foam x 15 reps without UE support  Treatment terminated early as patient requested to attend Stroke Support Holiday party here at [facility] that started at 12:15 pm.   PATIENT EDUCATION: Education details: exam findings, indications, plan,  HEP Person educated: Patient Education method: Consulting civil engineer, Demonstration, Verbal cues, and Handouts Education comprehension: verbalized understanding, returned demonstration, verbal cues required, and needs further  education   HOME EXERCISE PROGRAM: Access Code: [email or web address] URL: [email or web address] Date: 06/02/2022 Prepared by: [name]  Exercises - Standing Single Leg Stance with Counter Support  - 2 x daily - 7 x weekly - 1 sets - 10 reps - 10s  hold - Standing Marching  - 2 x daily - 7 x weekly - 2 sets - 20 reps - Side Stepping with Counter Support  - 2 x daily - 7 x weekly - 2 sets - 20 reps - Walking  - 2 x daily - 7 x weekly    GOALS: Goals reviewed with patient? Yes  SHORT TERM GOALS: Target date: 07/30/2022  Patient will be independent in home exercise program to improve strength/mobility for better functional independence with ADLs. Baseline: initiated Goal status: INITIAL   LONG TERM GOALS: Target date: 09/10/2022  Patient will increase FOTO score to equal to or greater than 65  to demonstrate improvement in mobility and quality of life.  Baseline: 57 Goal status: INITIAL  2.  Patient (> 40 years old) will complete five times sit to stand test in < 15 seconds indicating an increased LE strength and improved balance. Baseline: 17 sec hands-free; 11/1: 15.36 sec Goal status: IN-PROGRESS  3.  Patient will increase 10 meter walk test to >1.93ms as to improve gait speed for better community ambulation and to reduce fall risk. Baseline: 0.66 m/s; 11/1:  0.85 m/s Goal status: IN-PROGRESS  4.  Patient will increase Berg Balance score by > 3 points to demonstrate decreased fall risk during functional activities. Baseline: 50/56 Goal status: INITIAL  5.  Patient will increase six minute walk test distance to >1000 for progression to community ambulator and improve gait ability Baseline: 04/10/22: 6750f(pain onset at 5 minutes); 05/21/22: 1027 ft Goal  status: MET    ASSESSMENT:  CLINICAL IMPRESSION:  Pt performed well with good recall of logistics with walking with zero errors.  Pt continues to demo good progress with her balance with no significant LOB today. Session was shortened to allow patient to attend stroke support Holiday party.    Pt will continue to benefit from skilled therapy to address remaining deficits in order to improve overall QoL and return to PLOF.       OBJECTIVE IMPAIRMENTS Abnormal gait, decreased activity tolerance, decreased balance, decreased coordination, decreased endurance, decreased mobility, difficulty walking, decreased strength, impaired sensation, impaired UE functional use, improper body mechanics, postural dysfunction, and pain.   ACTIVITY LIMITATIONS carrying, lifting, bending, standing, squatting, stairs, bed mobility, bathing, reach over head, hygiene/grooming, and locomotion level  PARTICIPATION LIMITATIONS: meal prep, cleaning, laundry, medication management, personal finances, driving, shopping, community activity, and yard work  PERSONAL FACTORS Age, Sex, Time since onset of injury/illness/exacerbation, and 3+ comorbidities: Other PMH is significant for HTN, HLD, macular degeneration, DM, obesity, sleep apnea, arthritis, chronic R hip pain, vitamin D deficiency, diastasis recti, abdominal wall hernia, hx of abdominal hysterectomy, appendectomy, spine surgery (years ago).   are also affecting patient's functional outcome.   REHAB POTENTIAL: Good  CLINICAL DECISION MAKING: Evolving/moderate complexity  EVALUATION COMPLEXITY: Moderate  PLAN: PT FREQUENCY: 2x/week  PT DURATION: 12 weeks  PLANNED INTERVENTIONS: Therapeutic exercises, Therapeutic activity, Neuromuscular re-education, Balance training, Gait training, Patient/Family education, Self Care, Joint mobilization, Joint manipulation, Stair training, Vestibular training, Canalith repositioning, Orthotic/Fit training, DME instructions,  Dry Needling, Electrical stimulation, Wheelchair mobility training, Spinal mobilization, Cryotherapy, Moist heat, Splintting, Taping, Traction, Ultrasound, Biofeedback, Manual therapy, and Re-evaluation  PLAN FOR NEXT SESSION:   HEP  compliance and introduce more cognitive related tasks.   Ollen Bowl, PT Physical Therapist- [facility]  06/18/22, 8:26 AM

## 2022-06-18 NOTE — Therapy (Signed)
OUTPATIENT PHYSICAL THERAPY NEURO TREATMENT    Patient Name: Alyssa Mayo MRN: [id number] DOB:September 12, 1945, 76 y.o., female Today's Date: 06/19/2022   PCP: [place] Pink, MD REFERRING PROVIDER: Frann Rider, NP   PT End of Session - 06/19/22 1106     Visit Number 18    Number of Visits 25    Date for PT Re-Evaluation 06/25/22    Authorization Type [employer] Medicare    Authorization Time Period 04/02/2022-06/25/2022    Progress Note Due on Visit 20    PT Start Time 1103    PT Stop Time 1145    PT Time Calculation (min) 42 min    Equipment Utilized During Treatment Gait belt    Activity Tolerance Patient tolerated treatment well;No increased pain    Behavior During Therapy [facility] for tasks assessed/performed                 Past Medical History:  Diagnosis Date   Abnormal levels of other serum enzymes 07/17/2015   Arthritis    Constipation 07/11/2015   COVID-19 03/06/2021   Diabetes mellitus without complication ([place])    Elevated liver enzymes    Fatty liver    Generalized abdominal pain 07/11/2015   Hyperlipidemia    Hypertension    Lifelong obesity    Macular degeneration    Morbid obesity due to excess calories ([place]) 07/11/2015   Other fatigue 07/11/2015   Sleep apnea    Spinal headache    Past Surgical History:  Procedure Laterality Date   ABDOMINAL HYSTERECTOMY     APPENDECTOMY     CATARACT EXTRACTION     CERVICAL LAMINECTOMY  07/21/1985   CESAREAN SECTION     COLONOSCOPY  07/21/2012   diverticulosis, [facility] Dr. Donnella Sham    COLONOSCOPY WITH PROPOFOL N/A 02/04/2019   Procedure: COLONOSCOPY WITH PROPOFOL;  Surgeon: Lollie Sails, MD;  Location: [facility] ENDOSCOPY;  Service: Endoscopy;  Laterality: N/A;   COLONOSCOPY WITH PROPOFOL N/A 03/18/2021   Procedure: COLONOSCOPY WITH PROPOFOL;  Surgeon: Lesly Rubenstein, MD;  Location: [facility] ENDOSCOPY;  Service: Endoscopy;  Laterality: N/A;  DM   DIAGNOSTIC LAPAROSCOPY     ESOPHAGOGASTRODUODENOSCOPY (EGD)  WITH PROPOFOL N/A 02/04/2019   Procedure: ESOPHAGOGASTRODUODENOSCOPY (EGD) WITH PROPOFOL;  Surgeon: Lollie Sails, MD;  Location: [facility] ENDOSCOPY;  Service: Endoscopy;  Laterality: N/A;   ESOPHAGOGASTRODUODENOSCOPY (EGD) WITH PROPOFOL N/A 03/18/2021   Procedure: ESOPHAGOGASTRODUODENOSCOPY (EGD) WITH PROPOFOL;  Surgeon: Lesly Rubenstein, MD;  Location: [facility] ENDOSCOPY;  Service: Endoscopy;  Laterality: N/A;   LOOP RECORDER INSERTION N/A 08/05/2021   Procedure: LOOP RECORDER INSERTION;  Surgeon: Evans Lance, MD;  Location: [place] CV LAB;  Service: Cardiovascular;  Laterality: N/A;   [place]     TUBAL LIGATION     Patient Active Problem List   Diagnosis Date Noted   Cerebral edema ([place]) 08/07/2021   OSA (obstructive sleep apnea) 08/07/2021   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   CVA (cerebral vascular accident) ([place]) 08/01/2021   GERD (gastroesophageal reflux disease) 07/20/2019   Advanced care planning/counseling discussion 12/17/2016   Skin lesions, generalized 11/13/2016   Chronic right hip pain 06/17/2016   Vitamin D deficiency 09/26/2015   Diastasis recti 08/08/2015   Elevated serum GGT level 07/24/2015   Essential hypertension 07/17/2015   Fatty liver 07/17/2015   Elevated alkaline phosphatase level 07/17/2015   Elevated serum glutamic pyruvic transaminase (SGPT) level 07/17/2015   Abnormal finding on EKG 07/11/2015   Morbid obesity ([place]) 07/11/2015  Colon, diverticulosis 07/11/2015   Abdominal wall hernia 07/11/2015   DM (diabetes mellitus), type 2 with neurological complications [facility])    Hyperlipidemia     ONSET DATE: January 11th 2023  REFERRING DIAG:  I69.398,R26.9 (ICD-10-CM) - Gait disturbance, post-stroke  I63.511 (ICD-10-CM) - Right middle cerebral artery stroke (HCC)    THERAPY DIAG:  Difficulty in walking, not elsewhere classified  Right middle cerebral artery stroke (HCC)  Muscle weakness (generalized)  Other lack of  coordination  Unsteadiness on feet  Cognitive communication deficit  Rationale for Evaluation and Treatment Rehabilitation  SUBJECTIVE:  Pt has no new complaints and denies any falls or pain at this time.  Pt is getting ready for the Christmas shopping that she still has to do.   Pt accompanied by: self  PERTINENT HISTORY: Per chart and confirmed by pt:  Pt is a 76 yo female with RMCA CVA infarction with hemorrhagic transformation 08/01/2021, with inpatient rehab 08/07/2021-08/22/2021 followed by [facility] therapy until June. Pt now currently being seen for outpatient OT and ST services. Pt reports limitations in stamina persist. Other PMH is significant for HTN, HLD, macular degeneration, DM, obesity, sleep apnea, arthritis, chronic R hip pain, vitamin D deficiency, diastasis recti, abdominal wall hernia, hx of abdominal hysterectomy, appendectomy, spine surgery (years ago).   PAIN:  Are you having pain? No Pain Location:     TODAY'S TREATMENT:   TherEx:   Seated LAQ with 4# AW, 3 sec holds, 2x15 each LE Stair training with 4# AW donned, x2 flights of stairs, CGA with no UE support Ambulation around the gym and to the medical mall and gift shop Standing hip abduction with no UE support and 4# AW donned, 2x10 Standing hip marches with no UE support and 4# AW donned, 2x10 Standing hip extension with no UE support and 4# AW donned, 2x10 Standing hamstring curls with no UE, 4# AW, 2x10 Standing calf raises with 4# AW donned, 2x10      PATIENT EDUCATION: Education details: exam findings, indications, plan, HEP Person educated: Patient Education method: Consulting civil engineer, Demonstration, Verbal cues, and Handouts Education comprehension: verbalized understanding, returned demonstration, verbal cues required, and needs further education   HOME EXERCISE PROGRAM: Access Code: [email or web address] URL: [email or web address] Date: 06/02/2022 Prepared by: [name]  Exercises -  Standing Single Leg Stance with Counter Support  - 2 x daily - 7 x weekly - 1 sets - 10 reps - 10s  hold - Standing Marching  - 2 x daily - 7 x weekly - 2 sets - 20 reps - Side Stepping with Counter Support  - 2 x daily - 7 x weekly - 2 sets - 20 reps - Walking  - 2 x daily - 7 x weekly    GOALS: Goals reviewed with patient? Yes  SHORT TERM GOALS: Target date: 07/31/2022  Patient will be independent in home exercise program to improve strength/mobility for better functional independence with ADLs. Baseline: initiated Goal status: INITIAL   LONG TERM GOALS: Target date: 09/11/2022  Patient will increase FOTO score to equal to or greater than 65  to demonstrate improvement in mobility and quality of life.  Baseline: 57 Goal status: INITIAL  2.  Patient (> 67 years old) will complete five times sit to stand test in < 15 seconds indicating an increased LE strength and improved balance. Baseline: 17 sec hands-free; 11/1: 15.36 sec Goal status: IN-PROGRESS  3.  Patient will increase 10 meter walk test to >1.87ms as to improve gait speed for  better community ambulation and to reduce fall risk. Baseline: 0.66 m/s; 11/1:  0.85 m/s Goal status: IN-PROGRESS  4.  Patient will increase Berg Balance score by > 3 points to demonstrate decreased fall risk during functional activities. Baseline: 50/56 Goal status: INITIAL  5.  Patient will increase six minute walk test distance to >1000 for progression to community ambulator and improve gait ability Baseline: 04/10/22: 698f (pain onset at 5 minutes); 05/21/22: 1027 ft Goal status: MET    ASSESSMENT:  CLINICAL IMPRESSION:  Pt performed well with therapeutic exercises given to her today and was able to tolerate improved endurance training without much fatigue.  Pt is does still require improved stability with stair navigation and prefers to utilized UE's.  Pt to benefit from improved muscle strengthening necessary for endurance and other  balance related tasks.  May attempt to perform LE strengthening exercises at next visit.   Pt will continue to benefit from skilled therapy to address remaining deficits in order to improve overall QoL and return to PLOF.        OBJECTIVE IMPAIRMENTS Abnormal gait, decreased activity tolerance, decreased balance, decreased coordination, decreased endurance, decreased mobility, difficulty walking, decreased strength, impaired sensation, impaired UE functional use, improper body mechanics, postural dysfunction, and pain.   ACTIVITY LIMITATIONS carrying, lifting, bending, standing, squatting, stairs, bed mobility, bathing, reach over head, hygiene/grooming, and locomotion level  PARTICIPATION LIMITATIONS: meal prep, cleaning, laundry, medication management, personal finances, driving, shopping, community activity, and yard work  PERSONAL FACTORS Age, Sex, Time since onset of injury/illness/exacerbation, and 3+ comorbidities: Other PMH is significant for HTN, HLD, macular degeneration, DM, obesity, sleep apnea, arthritis, chronic R hip pain, vitamin D deficiency, diastasis recti, abdominal wall hernia, hx of abdominal hysterectomy, appendectomy, spine surgery (years ago).   are also affecting patient's functional outcome.   REHAB POTENTIAL: Good  CLINICAL DECISION MAKING: Evolving/moderate complexity  EVALUATION COMPLEXITY: Moderate  PLAN: PT FREQUENCY: 2x/week  PT DURATION: 12 weeks  PLANNED INTERVENTIONS: Therapeutic exercises, Therapeutic activity, Neuromuscular re-education, Balance training, Gait training, Patient/Family education, Self Care, Joint mobilization, Joint manipulation, Stair training, Vestibular training, Canalith repositioning, Orthotic/Fit training, DME instructions, Dry Needling, Electrical stimulation, Wheelchair mobility training, Spinal mobilization, Cryotherapy, Moist heat, Splintting, Taping, Traction, Ultrasound, Biofeedback, Manual therapy, and Re-evaluation  PLAN  FOR NEXT SESSION:   LE strengthening: leg press/squats/etc. HEP compliance and introduce more cognitive related tasks.   JGwenlyn Saran PT, DPT Physical Therapist- [facility] 06/19/22, 1:46 PM

## 2022-06-19 ENCOUNTER — Ambulatory Visit: Payer: Medicare HMO | Admitting: Occupational Therapy

## 2022-06-19 ENCOUNTER — Ambulatory Visit: Payer: Medicare HMO

## 2022-06-19 ENCOUNTER — Ambulatory Visit: Payer: Medicare HMO | Admitting: Speech Pathology

## 2022-06-19 DIAGNOSIS — M6281 Muscle weakness (generalized): Secondary | ICD-10-CM

## 2022-06-19 DIAGNOSIS — R41841 Cognitive communication deficit: Secondary | ICD-10-CM

## 2022-06-19 DIAGNOSIS — R262 Difficulty in walking, not elsewhere classified: Secondary | ICD-10-CM

## 2022-06-19 DIAGNOSIS — R278 Other lack of coordination: Secondary | ICD-10-CM

## 2022-06-19 DIAGNOSIS — I63511 Cerebral infarction due to unspecified occlusion or stenosis of right middle cerebral artery: Secondary | ICD-10-CM

## 2022-06-19 DIAGNOSIS — R2681 Unsteadiness on feet: Secondary | ICD-10-CM

## 2022-06-19 NOTE — Therapy (Signed)
Occupational Therapy Progress Note  Dates of reporting period  05/06/2022   to   06/19/2022   Patient Name: Alyssa Mayo MRN: [id number] DOB:October 14, 1945, 76 y.o., female Today's Date: 03/14/2022  PCP: [name], MD REFERRING PROVIDER: [name], NP    OT End of Session - 06/19/22 1150     Visit Number 20    Number of Visits 24    Date for OT Re-Evaluation 08/19/22    Authorization Type Progress reporting period starting 03/04/2022    OT Start Time 1147    OT Stop Time 1230    OT Time Calculation (min) 43 min    Activity Tolerance Patient tolerated treatment well    Behavior During Therapy [facility] for tasks assessed/performed                       Past Medical History:  Diagnosis Date   Abnormal levels of other serum enzymes 07/17/2015   Arthritis    Constipation 07/11/2015   COVID-19 03/06/2021   Diabetes mellitus without complication (HCC)    Elevated liver enzymes    Fatty liver    Generalized abdominal pain 07/11/2015   Hyperlipidemia    Hypertension    Lifelong obesity    Macular degeneration    Morbid obesity due to excess calories (HCC) 07/11/2015   Other fatigue 07/11/2015   Sleep apnea    Spinal headache    Past Surgical History:  Procedure Laterality Date   ABDOMINAL HYSTERECTOMY     APPENDECTOMY     CATARACT EXTRACTION     CERVICAL LAMINECTOMY  07/21/1985   CESAREAN SECTION     COLONOSCOPY  07/21/2012   diverticulosis, [facility] Dr. [name]    COLONOSCOPY WITH PROPOFOL N/A 02/04/2019   Procedure: COLONOSCOPY WITH PROPOFOL;  Surgeon: [name], MD;  Location: [facility] ENDOSCOPY;  Service: Endoscopy;  Laterality: N/A;   COLONOSCOPY WITH PROPOFOL N/A 03/18/2021   Procedure: COLONOSCOPY WITH PROPOFOL;  Surgeon: [name], MD;  Location: [facility] ENDOSCOPY;  Service: Endoscopy;  Laterality: N/A;  DM   DIAGNOSTIC LAPAROSCOPY     ESOPHAGOGASTRODUODENOSCOPY (EGD) WITH PROPOFOL N/A 02/04/2019   Procedure:  ESOPHAGOGASTRODUODENOSCOPY (EGD) WITH PROPOFOL;  Surgeon: [name], MD;  Location: [facility] ENDOSCOPY;  Service: Endoscopy;  Laterality: N/A;   ESOPHAGOGASTRODUODENOSCOPY (EGD) WITH PROPOFOL N/A 03/18/2021   Procedure: ESOPHAGOGASTRODUODENOSCOPY (EGD) WITH PROPOFOL;  Surgeon: [name], MD;  Location: [facility] ENDOSCOPY;  Service: Endoscopy;  Laterality: N/A;   LOOP RECORDER INSERTION N/A 08/05/2021   Procedure: LOOP RECORDER INSERTION;  Surgeon: [name], MD;  [facility] INVASIVE CV LAB;  Service: Cardiovascular;  Laterality: N/A;   SPINE SURGERY     TUBAL LIGATION     Patient Active Problem List   Diagnosis Date Noted   Cerebral edema (HCC) 08/07/2021   OSA (obstructive sleep apnea) 08/07/2021   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   CVA (cerebral vascular accident) (HCC) 08/01/2021   GERD (gastroesophageal reflux disease) 07/20/2019   Advanced care planning/counseling discussion 12/17/2016   Skin lesions, generalized 11/13/2016   Chronic right hip pain 06/17/2016   Vitamin D deficiency 09/26/2015   Diastasis recti 08/08/2015   Elevated serum GGT level 07/24/2015   Essential hypertension 07/17/2015   Fatty liver 07/17/2015   Elevated alkaline phosphatase level 07/17/2015   Elevated serum glutamic pyruvic transaminase (SGPT) level 07/17/2015   Abnormal finding on EKG 07/11/2015   Morbid obesity (HCC) 07/11/2015  Colon, diverticulosis 07/11/2015   Abdominal wall hernia 07/11/2015   DM (diabetes mellitus), type 2 with neurological complications (HCC)    Hyperlipidemia     ONSET DATE: 08/01/2021  REFERRING DIAG: CVA  THERAPY DIAG:  Muscle weakness (generalized)  Other lack of coordination  Rationale for Evaluation and Treatment Rehabilitation  SUBJECTIVE:   SUBJECTIVE STATEMENT:   Pt. reports  having a big family holiday party this weekend.  Pt accompanied by: self  PERTINENT HISTORY:  Pt. Is a 76 y.o. female  presents with a  diagnosis of RMCA CVA infarction with hemorrhagic transformation. Pt. Attended inpatient rehab from 08/07/2021-08/22/2021. Pt. received Home health therapy services this past spring.  Pt. Has recently had an assessment through driver rehabilitation services at [facility] with recommendations for referrals for  outpatient OT/PT, and ST services. Pt. PMHx includes: HTN, Hyperlipidemia, Macular degeneration, DM, and Obesity.  PRECAUTIONS: None  WEIGHT BEARING RESTRICTIONS No  PAIN:  Are you having pain? No.    FALLS: Has patient fallen in last 6 months? Yes.  LIVING ENVIRONMENT: Lives with: lives with their family  Son  [name] Lives in: House/apartment Main living are on one floor Stairs:  2 steps to enter Has following equipment at home: Single point cane, Wheelchair (manual), shower chair, Grab bars, bed rail, and rubber mat.  PLOF: Independent  PATIENT GOALS: To be able to Drive, and cook again  OBJECTIVE:   HAND DOMINANCE: Right   ADLs: Overall ADLs:  Transfers/ambulation related to ADLs: Independent Eating: Independent Grooming: Pt. Fatigues with sustained BUE's in elevation for haircare. Especially with blow drying hair. UB Dressing: Independent pullover shirt, independent now with fastening a bra LB Dressing: Independent donning pants socks, and slide on shoes Toileting: Independent Bathing: Independent Tub Shower transfers: Walk-in shower Independent now   IADLs: Shopping: Needs to be accompanied to the grocery store. Light housekeeping: Does own laundry, laundry,  Meal Prep:  Difficulty with cooking Community mobility: Relies on son, and friend Medication management: Son sets up Mining engineer management: Manages monthly bills independently. Handwriting: No change from baseline  MOBILITY STATUS: Hx of falls out of bed   ACTIVITY TOLERANCE: Activity tolerance:  10-20 min. Before rest break  FUNCTIONAL OUTCOME MEASURES: FOTO: 57  TR score: 62  UPPER EXTREMITY ROM      Active ROM Right eval Left eval Left 05/06/2022 Left 05/27/2022 Left 06/19/2022   Shoulder flexion WFL 96(110) 107(110) 109(120) 112(126)  Shoulder abduction WFL 82(105) 86(105) 92(115) 94(115)  Shoulder adduction       Shoulder extension       Shoulder internal rotation       Shoulder external rotation       Elbow flexion [facility] WFL  Elbow extension [facility] WFL  Wrist flexion WFL 44(60) 68(68) WFL WFL  Wrist extension WFL 32(60) 66(66) WFL WFL  Wrist ulnar deviation       Wrist radial deviation       Wrist pronation       Wrist supination       (Blank rows = not tested)   UPPER EXTREMITY MMT:     MMT Right eval Left eval Left 05/06/2022 Left 05/27/2022 Left 06/19/2022  Shoulder flexion 4+/5 3-/5 3+/5 3+/5 3+/5  Shoulder abduction 4+/5 3-/5 3-/5 3+/5 3+/5  Shoulder adduction       Shoulder extension       Shoulder internal rotation       Shoulder external rotation  Middle trapezius       Lower trapezius       Elbow flexion 4+/5 4-/5 4+/5 5/5 5/5  Elbow extension 4+/5 4-/5 4+/5 5/5 5/5  Wrist flexion 4+/5 3-/5 4/5 4+/5 4+/5  Wrist extension 4+/5 3/5 4/5 4+/5 4+/5  Wrist ulnar deviation       Wrist radial deviation       Wrist pronation       Wrist supination       (Blank rows = not tested)  HAND FUNCTION: Grip strength: Right: 30 lbs; Left: 13 lbs, Lateral pinch: Right: 15 lbs, Left: 8 lbs, and 3 point pinch: Right: 13 lbs, Left: 7 lbs  05/06/2022 Grip strength: Right: 30 lbs; Left: 14 lbs, Lateral pinch: Right: 15 lbs, Left: 10 lbs, and 3 point pinch: Right: 13 lbs, Left: 7 lbs  05/27/2022:  Grip strength: Right: 30 lbs; Left: 17 lbs, Lateral pinch: Right: 15 lbs, Left: 11 lbs, and 3 point pinch: Right: 13 lbs, Left: 7 lbs  06/19/2022:  Grip strength: Right: 30 lbs; Left: 18 lbs, Lateral pinch: Right: 15 lbs, Left:  10 lbs, and 3 point pinch: Right: 13 lbs, Left: 8 lbs   COORDINATION:  9 Hole Peg test: Right: 26  sec; Left: 1  min. & 9 sec  05/06/2022 9 Hole Peg test: Right: 26  sec; Left: 1 min.  05/27/2022  9 Hole Peg test: Right: 26  sec; Left: 1 min. 4 sec.   06/19/2022  9 Hole Peg test: Right: 26  sec; Left: 1 min.   SENSATION: Light touch: WFL Proprioception: WFL  COGNITION: Overall cognitive status: Within functional limits for tasks assessed  VISION: Subjective report: Glasses. Just received a new prescription to increase bifocal strength Baseline vision: Macular Degeneration Visual history: macular degeneration  VISION ASSESSMENT: To be further assessed in functional context  Left sided Awareness    PERCEPTION: Limited left sided awareness   TODAY'S TREATMENT:     Measurements were obtained, and goals were reviewed with the Pt. Pt. has made steady progress over this progress reporting period. Pt. has made progress with left shoulder ROM, grip strength, 3pt. pinch strength, and fine motor coordination skills. Pt. is now able to securely hold, and use a brush while blow drying her hair.  FOTO score is 62. Pt. continues to have difficulty with lateral pinch strength, and holding/stabilizing utensils while cutting meat.  Pt. Also continues to work on improving left hand [facility] skills, speed, and dexterity in order to improve grasping, and manipulating small items for ADLs, and IADLs.        PATIENT EDUCATION: Education details: Kitchen safety/use of external memory aids to turn off burners, limit distractions when in kitchen Person educated: Patient Education method: verbal cues Education comprehension: verbalized understanding and pt does not plan to cook unless son is present.   HOME EXERCISE PROGRAM:  Continue with an ongoing assessment of HEP needs    GOALS: Goals reviewed with patient? Yes   SHORT TERM GOALS: Target date:  04/15/2022      Pt. Will improve FOTO score by 2 points to reflect improved pt. perceived functional performance Baseline: FOTO: 57, TR score: 62 10th  visit: FOTO: 65, 20th visit: FOTO 62, TR score: 62 Goal status: Achieved  LONG TERM GOALS: Target date: 08/19/2022    Pt. Will improve left UE strength by 2 mm grades to assist with repositioning her grandson on her lap. Baseline: Eval: left shoulder flexion: 3-/5, abduction 3-/5,  elbow flex/ext.  4-/5, wrist extension 3/5, wrist flexion 3-/5  10th visit: left shoulder flexion: 3+/5, abduction 3-+5,  elbow flex/ext. 4+/5, wrist extension 4/5, wrist flexion 4/5 05/27/2022: left shoulder flexion: 3+/5, abduction 3+/5,  elbow flex/ext. 5/5, wrist extension 4+/5, wrist flexion 4+/5  20th visit: left shoulder flexion: 3+/5, abduction 3+/5,  elbow flex/ext. 5/5, wrist extension 4+/5, wrist flexion 4+/5. Pt. Can now hold her grandson grandson on her lap, and reposition him. Pt. Is unable to hold him in standing. Goal status: Ongoing  2.  Pt. will improve left grip strength by 5# to be able to independently hold and use a blow dryer, and brush for hair care. Baseline: Eval: Left grip strength 13#. Pt. Has difficulty performing hair care, holding and using a brush, and blow dryer. 10th visit: Left grip 14#. Pt. Is now able to hold th e blow dryer for the sides, and top of head, not the back. 11/07: Left 17# Pt. Has difficulty blow drying then back of her head.20th visit: Left grip 18#. Pt. Is able to independently hold a brush, and blow dry her hair.  Goal status: Achieved  3.  Pt. Will improve left lateral pinch strength by 3# to be able to independently cut meat. Baseline: Eval: Left 8, Pt. Has difficulty cutting meat. 10th: Left: 10#.  Pt. Is able to cut tender meat, however is not as efficient 11/07: Left 11#. Pt. Is independent cutting meat with increased time. 20th visit: Left: 10#. Pt. Has difficulty positioning, and stabilizing the utensils while cutting meat. Goal status:  Achieved  4.  Pt. Will improve Left hand [facility] skills to be able to be able to independently, and efficiently manipulate small  objects during ADL tasks. Baseline: Eval: left: 1 min. 9 sec. 10th visit: Left: 1 min. Pt. Is improving with manipulating small objects. 11/07: Left: 1 min. & 4 sec. Pt. Has difficulty manipulating small objects. 20th visit: Left:  1 min. Pt. Is improving with grasping pills, however continues to have difficulty. Goal status: Ongoing   5.  Pt. Will perform light meal preparation with modified independence Baseline: Eval: Pt. Reports having difficulty performing light  meal preparation, and cooking tasks. 10th visit: Pt. Is able to make toast in the morning, a sandwich,  use the microwave, make coffee, and pour herself a drink. 11/07: Pt. Has difficulty holding a coffee pot when filling the reservoir 20th visit: Pt. Is making waffles in the toaster, heats items in in the microwave, Pt. Uses the oven when supervised.  Goal status: Ongoing  CLINICAL IMPRESSION:  Measurements were obtained, and goals were reviewed with the Pt. Pt. has made steady progress over this progress reporting period. Pt. has made progress with left shoulder ROM, grip strength, 3pt. pinch strength, and fine motor coordination skills. Pt. is now able to securely hold, and use a brush while blow drying her hair.  FOTO score is 62. Pt. continues to have difficulty with lateral pinch strength, and holding/stabilizing utensils while cutting meat.  Pt. Also continues to work on improving left hand [facility] skills, speed, and dexterity in order to improve grasping, and manipulating small items for ADLs, and IADLs.        PERFORMANCE DEFICITS in functional skills including ADLs, IADLs, coordination, dexterity, sensation, ROM, strength, FMC, decreased knowledge of use of DME, vision, and UE functional use, cognitive skills including attention, and psychosocial skills including coping strategies, environmental adaptation, habits, and routines and behaviors.   IMPAIRMENTS are limiting patient from ADLs, IADLs, and social participation.  COMORBIDITIES may have co-morbidities  that affects occupational performance. Patient will benefit from skilled OT to address above impairments and improve overall function.  MODIFICATION OR ASSISTANCE TO COMPLETE EVALUATION: Min-Moderate modification of tasks or assist with assess necessary to complete an evaluation.  OT OCCUPATIONAL PROFILE AND HISTORY: Detailed assessment: Review of records and additional review of physical, cognitive, psychosocial history related to current functional performance.  CLINICAL DECISION MAKING: Moderate - several treatment options, min-mod task modification necessary  REHAB POTENTIAL: Good  EVALUATION COMPLEXITY: Moderate    PLAN: OT FREQUENCY: 2x/week  OT DURATION: 12 weeks  PLANNED INTERVENTIONS: self care/ADL training, therapeutic exercise, therapeutic activity, neuromuscular re-education, manual therapy, passive range of motion, and paraffin  RECOMMENDED OTHER SERVICES: ST, and PT  CONSULTED AND AGREED WITH PLAN OF CARE: Patient  PLAN FOR NEXT SESSION:  L hand strengthening and coordination exercises  Olegario Messier, MS, OTR/L   Olegario Messier, MS, OTR/L

## 2022-06-20 NOTE — Therapy (Signed)
OUTPATIENT SPEECH LANGUAGE PATHOLOGY TREATMENT NOTE    Patient Name: Alyssa Mayo MRN: [id number] DOB:02/20/1946, 76 y.o., female Today's Date: 06/20/2022     PCP: [place] Pink, MD REFERRING PROVIDER: Frann Rider, NP   End of Session - 06/20/22 0755     Visit Number 19    Number of Visits 27    Date for SLP Re-Evaluation 08/21/22    Authorization Type [employer] HMO/PPO    Progress Note Due on Visit 20    SLP Start Time 1000    SLP Stop Time  1100    SLP Time Calculation (min) 60 min    Activity Tolerance Patient tolerated treatment well                  Past Medical History:  Diagnosis Date   Abnormal levels of other serum enzymes 07/17/2015   Arthritis    Constipation 07/11/2015   COVID-19 03/06/2021   Diabetes mellitus without complication ([place])    Elevated liver enzymes    Fatty liver    Generalized abdominal pain 07/11/2015   Hyperlipidemia    Hypertension    Lifelong obesity    Macular degeneration    Morbid obesity due to excess calories ([place]) 07/11/2015   Other fatigue 07/11/2015   Sleep apnea    Spinal headache    Past Surgical History:  Procedure Laterality Date   ABDOMINAL HYSTERECTOMY     APPENDECTOMY     CATARACT EXTRACTION     CERVICAL LAMINECTOMY  07/21/1985   CESAREAN SECTION     COLONOSCOPY  07/21/2012   diverticulosis, [facility] Dr. Donnella Sham    COLONOSCOPY WITH PROPOFOL N/A 02/04/2019   Procedure: COLONOSCOPY WITH PROPOFOL;  Surgeon: Lollie Sails, MD;  Location: [facility] ENDOSCOPY;  Service: Endoscopy;  Laterality: N/A;   COLONOSCOPY WITH PROPOFOL N/A 03/18/2021   Procedure: COLONOSCOPY WITH PROPOFOL;  Surgeon: Lesly Rubenstein, MD;  Location: [facility] ENDOSCOPY;  Service: Endoscopy;  Laterality: N/A;  DM   DIAGNOSTIC LAPAROSCOPY     ESOPHAGOGASTRODUODENOSCOPY (EGD) WITH PROPOFOL N/A 02/04/2019   Procedure: ESOPHAGOGASTRODUODENOSCOPY (EGD) WITH PROPOFOL;  Surgeon: Lollie Sails, MD;  Location: [facility] ENDOSCOPY;  Service:  Endoscopy;  Laterality: N/A;   ESOPHAGOGASTRODUODENOSCOPY (EGD) WITH PROPOFOL N/A 03/18/2021   Procedure: ESOPHAGOGASTRODUODENOSCOPY (EGD) WITH PROPOFOL;  Surgeon: Lesly Rubenstein, MD;  Location: [facility] ENDOSCOPY;  Service: Endoscopy;  Laterality: N/A;   LOOP RECORDER INSERTION N/A 08/05/2021   Procedure: LOOP RECORDER INSERTION;  Surgeon: Evans Lance, MD;  Location: [place] CV LAB;  Service: Cardiovascular;  Laterality: N/A;   SPINE SURGERY     TUBAL LIGATION     Patient Active Problem List   Diagnosis Date Noted   Cerebral edema ([place]) 08/07/2021   OSA (obstructive sleep apnea) 08/07/2021   Right middle cerebral artery stroke ([place]) 08/07/2021   CVA (cerebral vascular accident) ([place]) 08/01/2021   GERD (gastroesophageal reflux disease) 07/20/2019   Advanced care planning/counseling discussion 12/17/2016   Skin lesions, generalized 11/13/2016   Chronic right hip pain 06/17/2016   Vitamin D deficiency 09/26/2015   Diastasis recti 08/08/2015   Elevated serum GGT level 07/24/2015   Essential hypertension 07/17/2015   Fatty liver 07/17/2015   Elevated alkaline phosphatase level 07/17/2015   Elevated serum glutamic pyruvic transaminase (SGPT) level 07/17/2015   Abnormal finding on EKG 07/11/2015   Morbid obesity ([place]) 07/11/2015   Colon, diverticulosis 07/11/2015   Abdominal wall hernia 07/11/2015   DM (diabetes mellitus), type 2 with neurological complications ([place])  Hyperlipidemia     ONSET DATE: 08/01/2021 CVA; 02/26/2022 date of referral   REFERRING DIAG: 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 female with history of diabetes, hypertension, hyperlipidemia, obesity, and sleep apnea who presented to [facility] ED on 08/01/2021 after being found down by her son. Her last know well was approximately 07/29/2021. Pt transferred to [facility] for further CVA  management. In addition, pt received ST services in CIR from 08/08/2021 thru 08/21/2021.      DIAGNOSTIC FINDINGS: MRI showed extensive restricted diffusion throughout much of the right MCA territory with associated changes of hemorrhagic transformation and localized cerebral edema and mass effect. Per Dr. Leonie Man, felt embolic pattern secondary to unclear source although concerning for occult A fib. Repeat [facility] 1/14 showed no interval progression of right MCA hemorrhagic infarct with petechial hemorrhage.  CTA head/neck showed right M2 thrombus.  EF 60 to 65%.   THERAPY DIAG:  Cognitive communication deficit  Right middle cerebral artery stroke [facility])  Rationale for Evaluation and Treatment Rehabilitation  SUBJECTIVE: pt arrived late to session as appt with eye doctor ran over  Pt accompanied by: self  PAIN:  Are you having pain? No  PATIENT GOALS to be able to drive again and live independently (she currently lives alone but her son plans on moving in with her)  OBJECTIVE:   TODAY'S TREATMENT: Skilled treatment session focused on pt's cognitive communication goals. SLP facilitated the session by providing the following interventions:  While pt reports that she has been "deleting" a lot of emails and she only has "28 emails," pt actually has 28 unread emails from earlier in the month and hundreds of emails dating back to beginning of 2022.   With moderate assistance, pt able to select old emails and delete to aid in pt's ability to find emails containing bills  "Um, I didn't realize I got that" update from painter      PATIENT EDUCATION: Education details: Animator; directional awareness for navigating the hallways Person educated: Patient and Child(ren) Education method: Explanation, Demonstration, Verbal cues, and Handouts Education comprehension: needs further education  HOME EXERCISE PROGRAM  Updating email - deleting and un-scribing emails  GOALS: Goals  reviewed with patient? Yes  REPORTING PERIOD 03/04/2022 to 05/13/2022 SHORT TERM GOALS: Target date: 10 sessions   With moderate assistance, pt will recall her medicines with 80% accuracy.  Baseline: currently unable Goal status: MET; 91% recall independently   2.  With minimal assistance, pt will complete basic medication management task with > 90% accuracy.  Baseline: total Goal status: ONGOING; currently requiring moderate to minimal assistance    3.  Pt will demonstrate selective attention to basic task for 15 minutes with < 2 redirections.  Baseline: moderate assistance Goal status: ONGOING; currently requiring minimal to supervision assistance   4.  Pt will complete basic bill paying task with 75% accuracy.  Baseline: total Goal status: ONGOING; currently at 50% with minimal assistance  5. With minimal assistance, pt will use strategies to complete semi-complex visuospatial tasks with 85% accuracy.   Baseline: moderate  Goal status: INITIAL; ONGOING  6. Pt will require < 2 cues to initiate leaving session in 5 out of 6 opportunities.   Baseline: 6 cues  Goal status: INITIAL; ONGOING     LONG TERM GOALS: Target date: 05/27/2022 With rare min A, pt will organize her medicines accurately  in a pill organizer.  Baseline: Total Goal status: ONGOING   2.  With rare min A, pt will demonstrate selective attention to task for 30 minutes.  Baseline: moderate Goal status: ONGOING   3.  With rare min A, pt will sequence basic meal prep activity.  Baseline: Moderate Goal status: ONGOING      ASSESSMENT:  CLINICAL IMPRESSION: Given pt's right hemisphere disorder, prognosis is guarded. SLP wrote a note to pt's son requesting him to attend pt's session to promote increased carryover of concepts taught.   After pt completed her therapies, she attended the stroke support group. At the end of the meeting, pt stated "I need to call [name] and let him know where I am." At this point  30 minutes had passed with her son expecting to pick her up.   OBJECTIVE IMPAIRMENTS include attention and executive functioning. These impairments are limiting patient from managing medications, managing appointments, managing finances, household responsibilities, and ADLs/IADLs. Factors affecting potential to achieve goals and functional outcome are ability to learn/carryover information. Patient will benefit from skilled SLP services to address above impairments and improve overall function.  REHAB POTENTIAL: Good  PLAN: SLP FREQUENCY: 1-2x/week  SLP DURATION: 12 weeks  PLANNED INTERVENTIONS: Functional tasks, SLP instruction and feedback, and Patient/family education   Audrie Kuri B. Rutherford Nail, M.S., CCC-SLP, Mining engineer Certified Brain Injury [place]  [place] Office [phone] Ascom [phone] Fax [phone]

## 2022-06-24 ENCOUNTER — Ambulatory Visit: Payer: Medicare HMO | Admitting: Occupational Therapy

## 2022-06-24 ENCOUNTER — Ambulatory Visit: Payer: Medicare HMO | Attending: Adult Health | Admitting: Speech Pathology

## 2022-06-24 ENCOUNTER — Ambulatory Visit: Payer: Medicare HMO | Admitting: Physical Medicine and Rehabilitation

## 2022-06-24 ENCOUNTER — Ambulatory Visit: Payer: Medicare HMO

## 2022-06-24 DIAGNOSIS — R2681 Unsteadiness on feet: Secondary | ICD-10-CM | POA: Diagnosis present

## 2022-06-24 DIAGNOSIS — R41841 Cognitive communication deficit: Secondary | ICD-10-CM | POA: Insufficient documentation

## 2022-06-24 DIAGNOSIS — R262 Difficulty in walking, not elsewhere classified: Secondary | ICD-10-CM | POA: Insufficient documentation

## 2022-06-24 DIAGNOSIS — M6281 Muscle weakness (generalized): Secondary | ICD-10-CM | POA: Insufficient documentation

## 2022-06-24 DIAGNOSIS — R278 Other lack of coordination: Secondary | ICD-10-CM | POA: Diagnosis present

## 2022-06-24 DIAGNOSIS — I63511 Cerebral infarction due to unspecified occlusion or stenosis of right middle cerebral artery: Secondary | ICD-10-CM | POA: Diagnosis present

## 2022-06-24 NOTE — Therapy (Signed)
OUTPATIENT SPEECH LANGUAGE PATHOLOGY TREATMENT NOTE  10TH VISIT PROGRESS NOTE     Patient Name: Alyssa Mayo MRN: [id number] DOB:05-07-1946, 76 y.o., female Today's Date: 06/24/2022  Speech Therapy Progress Note  Dates of Reporting Period: 04/21/2022 to 06/24/2022  Objective: Patient has been seen for 10 speech therapy sessions this reporting period targeting pt's moderate cognitive communication impairment. Patient is making progress toward LTGs and met 1 STGs this reporting period. See skilled intervention, clinical impressions, and goals below for details.   PCP: Alyssa Pink, MD REFERRING PROVIDER: Frann Rider, Mayo    End of Session - 06/24/22 1015     Visit Number 20    Number of Visits 27    Date for SLP Re-Evaluation 08/21/22    Authorization Type [employer] HMO/PPO    Progress Note Due on Visit 20    SLP Start Time 1000    SLP Stop Time  1100    SLP Time Calculation (min) 60 min    Activity Tolerance Patient tolerated treatment well                  Past Medical History:  Diagnosis Date   Abnormal levels of other serum enzymes 07/17/2015   Arthritis    Constipation 07/11/2015   COVID-19 03/06/2021   Diabetes mellitus without complication ([place])    Elevated liver enzymes    Fatty liver    Generalized abdominal pain 07/11/2015   Hyperlipidemia    Hypertension    Lifelong obesity    Macular degeneration    Morbid obesity due to excess calories ([place]) 07/11/2015   Other fatigue 07/11/2015   Sleep apnea    Spinal headache    Past Surgical History:  Procedure Laterality Date   ABDOMINAL HYSTERECTOMY     APPENDECTOMY     CATARACT EXTRACTION     CERVICAL LAMINECTOMY  07/21/1985   CESAREAN SECTION     COLONOSCOPY  07/21/2012   diverticulosis, [facility] Dr. Donnella Sham    COLONOSCOPY WITH PROPOFOL N/A 02/04/2019   Procedure: COLONOSCOPY WITH PROPOFOL;  Surgeon: Lollie Sails, MD;  Location: [facility] ENDOSCOPY;  Service: Endoscopy;  Laterality: N/A;    COLONOSCOPY WITH PROPOFOL N/A 03/18/2021   Procedure: COLONOSCOPY WITH PROPOFOL;  Surgeon: Lesly Rubenstein, MD;  Location: [facility] ENDOSCOPY;  Service: Endoscopy;  Laterality: N/A;  DM   DIAGNOSTIC LAPAROSCOPY     ESOPHAGOGASTRODUODENOSCOPY (EGD) WITH PROPOFOL N/A 02/04/2019   Procedure: ESOPHAGOGASTRODUODENOSCOPY (EGD) WITH PROPOFOL;  Surgeon: Lollie Sails, MD;  Location: [facility] ENDOSCOPY;  Service: Endoscopy;  Laterality: N/A;   ESOPHAGOGASTRODUODENOSCOPY (EGD) WITH PROPOFOL N/A 03/18/2021   Procedure: ESOPHAGOGASTRODUODENOSCOPY (EGD) WITH PROPOFOL;  Surgeon: Lesly Rubenstein, MD;  Location: [facility] ENDOSCOPY;  Service: Endoscopy;  Laterality: N/A;   LOOP RECORDER INSERTION N/A 08/05/2021   Procedure: LOOP RECORDER INSERTION;  Surgeon: Evans Lance, MD;  Location: [place] CV LAB;  Service: Cardiovascular;  Laterality: N/A;   SPINE SURGERY     TUBAL LIGATION     Patient Active Problem List   Diagnosis Date Noted   Cerebral edema ([place]) 08/07/2021   OSA (obstructive sleep apnea) 08/07/2021   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   CVA (cerebral vascular accident) ([place]) 08/01/2021   GERD (gastroesophageal reflux disease) 07/20/2019   Advanced care planning/counseling discussion 12/17/2016   Skin lesions, generalized 11/13/2016   Chronic right hip pain 06/17/2016   Vitamin D deficiency 09/26/2015   Diastasis recti 08/08/2015   Elevated serum GGT level 07/24/2015   Essential  hypertension 07/17/2015   Fatty liver 07/17/2015   Elevated alkaline phosphatase level 07/17/2015   Elevated serum glutamic pyruvic transaminase (SGPT) level 07/17/2015   Abnormal finding on EKG 07/11/2015   Morbid obesity ([place]) 07/11/2015   Colon, diverticulosis 07/11/2015   Abdominal wall hernia 07/11/2015   DM (diabetes mellitus), type 2 with neurological complications ([place])    Hyperlipidemia     ONSET DATE: 08/01/2021 CVA; 02/26/2022 date of referral   REFERRING DIAG: I69.319  (ICD-10-CM) - Cognitive deficit, post-stroke I63.511 (ICD-10-CM) - Right middle cerebral artery stroke [facility])     PERTINENT HISTORY: [name] is a 76 y.o. female with history of diabetes, hypertension, hyperlipidemia, obesity, and sleep apnea who presented to [facility] ED on 08/01/2021 after being found down by her son. Her last know well was approximately 07/29/2021. Pt transferred to [facility] for further CVA management. In addition, pt received ST services in CIR from 08/08/2021 thru 08/21/2021.      DIAGNOSTIC FINDINGS: MRI showed extensive restricted diffusion throughout much of the right MCA territory with associated changes of hemorrhagic transformation and localized cerebral edema and mass effect. Per Dr. Leonie Man, felt embolic pattern secondary to unclear source although concerning for occult A fib. Repeat [facility] 1/14 showed no interval progression of right MCA hemorrhagic infarct with petechial hemorrhage.  CTA head/neck showed right M2 thrombus.  EF 60 to 65%.   THERAPY DIAG:  Cognitive communication deficit  Right middle cerebral artery stroke [facility])  Rationale for Evaluation and Treatment Rehabilitation  SUBJECTIVE: pt brought in word scramble that she received at her ladies' meeting for church, expressed SLP might be interested in the words listed "I thought about you"  Pt accompanied by: self  PAIN:  Are you having pain? No  PATIENT GOALS to be able to drive again and live independently (she currently lives alone but her son plans on moving in with her)  OBJECTIVE:   TODAY'S TREATMENT: Skilled treatment session focused on pt's cognitive communication goals. SLP facilitated the session by providing the following interventions:  SLP spoke with pt's son via phone about coming to pt's ST session at least once per week - plan ot ocme on next session  In reviewing pt's emails with her, pt had independently monitored emails, deleted ones that were not important and only had salient  important emails on phone  Pt requested to do another seasonal word scramble. In reviewing pt's word scramble from her ladies' meeting at church, it became apparent that pt's spelling and some words were inaccurate. Therefore, SLP provided word search with blank lines to ensure correct use of number of letters. Pt commented, "This checks your spelling too." As such, pt benefited from [place] A faded to rare Min A cues.       PATIENT EDUCATION: Education details: external memory aids Person educated: Patient and Child(ren) Education method: Explanation, Media planner, Verbal cues, and Handouts Education comprehension: needs further education  HOME EXERCISE PROGRAM  Updating email - deleting and un-scribing emails  GOALS: Goals reviewed with patient? Yes  SHORT TERM GOALS: Target date: 10 sessions  UPDATED: 04/21/2022  UPDATED: 06/24/2022 With moderate assistance, pt will recall her medicines with 80% accuracy.  Baseline: currently unable 04/21/2022 Goal status: MET; 91% recall independently   2.  With minimal assistance, pt will complete basic medication management task with > 90% accuracy.  Baseline: total 04/21/2022 Goal status: ONGOING; currently requiring moderate to minimal assistance  06/24/2022 Goal Status: MET Updated Goal: with supervision assistance, pt will complete basic  medication management task with > 90% accuracy.    3.  Pt will demonstrate selective attention to basic task for 15 minutes with < 2 redirections.  Baseline: moderate assistance 04/21/2022 Goal status: ONGOING; currently requiring minimal to supervision assistance 06/24/2022 Goal Status: NOT MET Updated Goal: Pt will demonstrate selective attention to basic task for 10 minutes with < 5 redirections.    4.  Pt will complete basic bill paying task with 75% accuracy.  Baseline: total 04/21/2022 Goal status: ONGOING; currently at 50% with minimal assistance 06/24/2022 Goal Status: NOT MET Updated  Goal: Pt will use external visual to assess which bills have been paid by her son.   5. With minimal assistance, pt will use strategies to complete semi-complex visuospatial tasks with 85% accuracy.   Baseline: moderate  Goal status: INITIAL  04/21/2022  Goal Status: ONGOING  06/21/2022  Goal Status: MET  6. Pt will require < 2 cues to initiate leaving session in 5 out of 6 opportunities.   Baseline: 6 cues  Goal status: INITIAL 04/21/2022 Goal Status: ONGOING 06/21/2022 Goal Status: ONGOING     LONG TERM GOALS: Target date: 08/21/2021 With rare min A, pt will organize her medicines accurately in a pill organizer.  Baseline: Total Goal status: ONGOING   2.  With rare min A, pt will demonstrate selective attention to task for 30 minutes.  Baseline: moderate Goal status: ONGOING   3.  With rare min A, pt will sequence basic meal prep activity.  Baseline: Moderate Goal status: ONGOING      ASSESSMENT:  CLINICAL IMPRESSION: While pt has made some progress over the last 10 sessions, her progress is slower than expected d/t baseline disorganization, difficulty navigating loss of independence, adjusting to her son moving in with her.   OBJECTIVE IMPAIRMENTS include attention and executive functioning. These impairments are limiting patient from managing medications, managing appointments, managing finances, household responsibilities, and ADLs/IADLs. Factors affecting potential to achieve goals and functional outcome are ability to learn/carryover information. Patient will benefit from skilled SLP services to address above impairments and improve overall function.  REHAB POTENTIAL: Good  PLAN: SLP FREQUENCY: 1-2x/week  SLP DURATION: 12 weeks  PLANNED INTERVENTIONS: Functional tasks, SLP instruction and feedback, and Patient/family education    B. Rutherford Nail, M.S., CCC-SLP, Mining engineer Certified Brain Injury [place]  [place] Office [phone] Ascom [phone] Fax [phone]

## 2022-06-24 NOTE — Therapy (Signed)
Occupational Therapy Treatment Note  Patient Name: Alyssa Mayo MRN: [id number] DOB:1945-09-25, 76 y.o., female Today's Date: 03/14/2022  PCP: [name], MD REFERRING PROVIDER: [name], NP    OT End of Session - 06/24/22 1139     Visit Number 21    Number of Visits 24    Date for OT Re-Evaluation 08/19/22    Authorization Type Progress reporting period starting 03/04/2022    OT Start Time 1100    OT Stop Time 1145    OT Time Calculation (min) 45 min    Activity Tolerance Patient tolerated treatment well    Behavior During Therapy [facility] for tasks assessed/performed                         Past Medical History:  Diagnosis Date   Abnormal levels of other serum enzymes 07/17/2015   Arthritis    Constipation 07/11/2015   COVID-19 03/06/2021   Diabetes mellitus without complication (HCC)    Elevated liver enzymes    Fatty liver    Generalized abdominal pain 07/11/2015   Hyperlipidemia    Hypertension    Lifelong obesity    Macular degeneration    Morbid obesity due to excess calories (HCC) 07/11/2015   Other fatigue 07/11/2015   Sleep apnea    Spinal headache    Past Surgical History:  Procedure Laterality Date   ABDOMINAL HYSTERECTOMY     APPENDECTOMY     CATARACT EXTRACTION     CERVICAL LAMINECTOMY  07/21/1985   CESAREAN SECTION     COLONOSCOPY  07/21/2012   diverticulosis, [facility] Dr. [name]    COLONOSCOPY WITH PROPOFOL N/A 02/04/2019   Procedure: COLONOSCOPY WITH PROPOFOL;  Surgeon: [name], MD;  Location[facility] ENDOSCOPY;  Service: Endoscopy;  Laterality: N/A;   COLONOSCOPY WITH PROPOFOL N/A 03/18/2021   Procedure: COLONOSCOPY WITH PROPOFOL;  Surgeon: [name], MD;  Location: [facility] ENDOSCOPY;  Service: Endoscopy;  Laterality: N/A;  DM   DIAGNOSTIC LAPAROSCOPY     ESOPHAGOGASTRODUODENOSCOPY (EGD) WITH PROPOFOL N/A 02/04/2019   Procedure: ESOPHAGOGASTRODUODENOSCOPY (EGD) WITH PROPOFOL;  Surgeon: [name], MD;  Location: [facility] ENDOSCOPY;  Service: Endoscopy;  Laterality: N/A;   ESOPHAGOGASTRODUODENOSCOPY (EGD) WITH PROPOFOL N/A 03/18/2021   Procedure: ESOPHAGOGASTRODUODENOSCOPY (EGD) WITH PROPOFOL;  Surgeon: [name], MD;  Location: [facility] ENDOSCOPY;  Service: Endoscopy;  Laterality: N/A;   LOOP RECORDER INSERTION N/A 08/05/2021   Procedure: LOOP RECORDER INSERTION;  Surgeon: [name], MD;  Location: [facility] INVASIVE CV LAB;  Service: Cardiovascular;  Laterality: N/A;   SPINE SURGERY     TUBAL LIGATION     Patient Active Problem List   Diagnosis Date Noted   Cerebral edema (HCC) 08/07/2021   OSA (obstructive sleep apnea) 08/07/2021   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   CVA (cerebral vascular accident) (HCC) 08/01/2021   GERD (gastroesophageal reflux disease) 07/20/2019   Advanced care planning/counseling discussion 12/17/2016   Skin lesions, generalized 11/13/2016   Chronic right hip pain 06/17/2016   Vitamin D deficiency 09/26/2015   Diastasis recti 08/08/2015   Elevated serum GGT level 07/24/2015   Essential hypertension 07/17/2015   Fatty liver 07/17/2015   Elevated alkaline phosphatase level 07/17/2015   Elevated serum glutamic pyruvic transaminase (SGPT) level 07/17/2015   Abnormal finding on EKG 07/11/2015   Morbid obesity (HCC) 07/11/2015   Colon, diverticulosis 07/11/2015   Abdominal wall hernia 07/11/2015   DM (  diabetes mellitus), type 2 with neurological complications (HCC)    Hyperlipidemia     ONSET DATE: 08/01/2021  REFERRING DIAG: CVA  THERAPY DIAG:  Muscle weakness (generalized)  Other lack of coordination  Rationale for Evaluation and Treatment Rehabilitation  SUBJECTIVE:   SUBJECTIVE STATEMENT:   Pt. reports having worked her brain in ST this morning.    Pt accompanied by: self  PERTINENT HISTORY:  Pt. is a 76 y.o. female who presents with a diagnosis of RMCA CVA infarction with hemorrhagic transformation. Pt. attended  inpatient rehab from 08/07/2021-08/22/2021. Pt. received Home health therapy services this past spring.  Pt. has recently had an assessment through driver rehabilitation services at [facility] with recommendations for referrals for  outpatient OT/PT, and ST services. Pt. PMHx includes: HTN, Hyperlipidemia, Macular degeneration, DM, and Obesity.  PRECAUTIONS: None  WEIGHT BEARING RESTRICTIONS No  PAIN:  Are you having pain? No.    FALLS: Has patient fallen in last 6 months? Yes.  LIVING ENVIRONMENT: Lives with: lives with their family  Son  [name] Lives in: House/apartment Main living are on one floor Stairs:  2 steps to enter Has following equipment at home: Single point cane, Wheelchair (manual), shower chair, Grab bars, bed rail, and rubber mat.  PLOF: Independent  PATIENT GOALS: To be able to Drive, and cook again  OBJECTIVE:   HAND DOMINANCE: Right   ADLs: Overall ADLs:  Transfers/ambulation related to ADLs: Independent Eating: Independent Grooming: Pt. Fatigues with sustained BUE's in elevation for haircare. Especially with blow drying hair. UB Dressing: Independent pullover shirt, independent now with fastening a bra LB Dressing: Independent donning pants socks, and slide on shoes Toileting: Independent Bathing: Independent Tub Shower transfers: Walk-in shower Independent now   IADLs: Shopping: Needs to be accompanied to the grocery store. Light housekeeping: Does own laundry, laundry,  Meal Prep:  Difficulty with cooking Community mobility: Relies on son, and friend Medication management: Son sets up Mining engineer management: Manages monthly bills independently. Handwriting: No change from baseline  MOBILITY STATUS: Hx of falls out of bed   ACTIVITY TOLERANCE: Activity tolerance:  10-20 min. Before rest break  FUNCTIONAL OUTCOME MEASURES: FOTO: 57  TR score: 62  UPPER EXTREMITY ROM     Active ROM Right eval Left eval Left 05/06/2022 Left 05/27/2022  Left 06/19/2022   Shoulder flexion WFL 96(110) 107(110) 109(120) 112(126)  Shoulder abduction WFL 82(105) 86(105) 92(115) 94(115)  Shoulder adduction       Shoulder extension       Shoulder internal rotation       Shoulder external rotation       Elbow flexion [facility] WFL  Elbow extension [facility] WFL  Wrist flexion WFL 44(60) 68(68) WFL WFL  Wrist extension WFL 32(60) 66(66) WFL WFL  Wrist ulnar deviation       Wrist radial deviation       Wrist pronation       Wrist supination       (Blank rows = not tested)   UPPER EXTREMITY MMT:     MMT Right eval Left eval Left 05/06/2022 Left 05/27/2022 Left 06/19/2022  Shoulder flexion 4+/5 3-/5 3+/5 3+/5 3+/5  Shoulder abduction 4+/5 3-/5 3-/5 3+/5 3+/5  Shoulder adduction       Shoulder extension       Shoulder internal rotation       Shoulder external rotation       Middle trapezius       Lower trapezius  Elbow flexion 4+/5 4-/5 4+/5 5/5 5/5  Elbow extension 4+/5 4-/5 4+/5 5/5 5/5  Wrist flexion 4+/5 3-/5 4/5 4+/5 4+/5  Wrist extension 4+/5 3/5 4/5 4+/5 4+/5  Wrist ulnar deviation       Wrist radial deviation       Wrist pronation       Wrist supination       (Blank rows = not tested)  HAND FUNCTION: Grip strength: Right: 30 lbs; Left: 13 lbs, Lateral pinch: Right: 15 lbs, Left: 8 lbs, and 3 point pinch: Right: 13 lbs, Left: 7 lbs  05/06/2022 Grip strength: Right: 30 lbs; Left: 14 lbs, Lateral pinch: Right: 15 lbs, Left: 10 lbs, and 3 point pinch: Right: 13 lbs, Left: 7 lbs  05/27/2022:  Grip strength: Right: 30 lbs; Left: 17 lbs, Lateral pinch: Right: 15 lbs, Left: 11 lbs, and 3 point pinch: Right: 13 lbs, Left: 7 lbs  06/19/2022:  Grip strength: Right: 30 lbs; Left: 18 lbs, Lateral pinch: Right: 15 lbs, Left:  10 lbs, and 3 point pinch: Right: 13 lbs, Left: 8 lbs   COORDINATION:  9 Hole Peg test: Right: 26  sec; Left: 1 min. & 9 sec  05/06/2022 9 Hole Peg test: Right: 26  sec; Left: 1  min.  05/27/2022  9 Hole Peg test: Right: 26  sec; Left: 1 min. 4 sec.   06/19/2022  9 Hole Peg test: Right: 26  sec; Left: 1 min.   SENSATION: Light touch: WFL Proprioception: WFL  COGNITION: Overall cognitive status: Within functional limits for tasks assessed  VISION: Subjective report: Glasses. Just received a new prescription to increase bifocal strength Baseline vision: Macular Degeneration Visual history: macular degeneration  VISION ASSESSMENT: To be further assessed in functional context  Left sided Awareness    PERCEPTION: Limited left sided awareness   TODAY'S TREATMENT:     Neuro muscular re-education:  Pt. worked on bilateral [facility] skills needed to grasp small resistive beads. Pt. worked on connecting the beads using a 3pt. pinch, and pt. Pinch grasp.  Emphasis was placed on using the left hand to push the beads to connect them.  Therapeutic exercise:  Patient worked on range of motion using the pulleys for left shoulder flexion. Patient education was provided about proper set up and the use of the pulley. Patient performed active range of motion with passive range of motion to the end range for left shoulder flexion and abduction. Patient performed yellow Thera-Band triceps. Pt. performed 3# dumbbell ex. for elbow flexion and extension for 1 set 5 reps, followed by 2# for 1 set of 5 reps. 2# for forearm supination/pronation, wrist flexion/extension, and radial deviation. Pt. requires rest breaks and verbal cues for proper technique.   Patient presents with limited left shoulder active range of motion. Patient was able to tolerate the exercises well, and tolerated stretches/exercises with the pulley. Pt. education was provided about proper pulley set up for safe exercise performance. Pt. tolerated the theraband exercises well, and required the hand weight to be modified from 3# to 2#. Pt. Presents with difficulty initially with pressing the resistive beads together.  Pt. continues to work on improving left UE functioning in order to work towards improving LUE engagement in, and maximizing independence with ADLs, and IADL tasks.        PATIENT EDUCATION: Education details: Kitchen safety/use of external memory aids to turn off burners, limit distractions when in kitchen Person educated: Patient Education method: verbal cues Education comprehension: verbalized understanding and  pt does not plan to cook unless son is present.   HOME EXERCISE PROGRAM:  Continue with an ongoing assessment of HEP needs    GOALS: Goals reviewed with patient? Yes   SHORT TERM GOALS: Target date:  04/15/2022      Pt. Will improve FOTO score by 2 points to reflect improved pt. perceived functional performance Baseline: FOTO: 57, TR score: 62 10th visit: FOTO: 65, 20th visit: FOTO 62, TR score: 62 Goal status: Achieved  LONG TERM GOALS: Target date: 08/19/2022    Pt. Will improve left UE strength by 2 mm grades to assist with repositioning her grandson on her lap. Baseline: Eval: left shoulder flexion: 3-/5, abduction 3-/5,  elbow flex/ext. 4-/5, wrist extension 3/5, wrist flexion 3-/5  10th visit: left shoulder flexion: 3+/5, abduction 3-+5,  elbow flex/ext. 4+/5, wrist extension 4/5, wrist flexion 4/5 05/27/2022: left shoulder flexion: 3+/5, abduction 3+/5,  elbow flex/ext. 5/5, wrist extension 4+/5, wrist flexion 4+/5  20th visit: left shoulder flexion: 3+/5, abduction 3+/5,  elbow flex/ext. 5/5, wrist extension 4+/5, wrist flexion 4+/5. Pt. Can now hold her grandson grandson on her lap, and reposition him. Pt. Is unable to hold him in standing. Goal status: Ongoing  2.  Pt. will improve left grip strength by 5# to be able to independently hold and use a blow dryer, and brush for hair care. Baseline: Eval: Left grip strength 13#. Pt. Has difficulty performing hair care, holding and using a brush, and blow dryer. 10th visit: Left grip 14#. Pt. Is now able to hold th e  blow dryer for the sides, and top of head, not the back. 11/07: Left 17# Pt. Has difficulty blow drying then back of her head.20th visit: Left grip 18#. Pt. Is able to independently hold a brush, and blow dry her hair.  Goal status: Achieved  3.  Pt. Will improve left lateral pinch strength by 3# to be able to independently cut meat. Baseline: Eval: Left 8, Pt. Has difficulty cutting meat. 10th: Left: 10#.  Pt. Is able to cut tender meat, however is not as efficient 11/07: Left 11#. Pt. Is independent cutting meat with increased time. 20th visit: Left: 10#. Pt. Has difficulty positioning, and stabilizing the utensils while cutting meat. Goal status:  Achieved  4.  Pt. Will improve Left hand [facility] skills to be able to be able to independently, and efficiently manipulate small objects during ADL tasks. Baseline: Eval: left: 1 min. 9 sec. 10th visit: Left: 1 min. Pt. Is improving with manipulating small objects. 11/07: Left: 1 min. & 4 sec. Pt. Has difficulty manipulating small objects. 20th visit: Left:  1 min. Pt. Is improving with grasping pills, however continues to have difficulty. Goal status: Ongoing   5.  Pt. Will perform light meal preparation with modified independence Baseline: Eval: Pt. Reports having difficulty performing light  meal preparation, and cooking tasks. 10th visit: Pt. Is able to make toast in the morning, a sandwich,  use the microwave, make coffee, and pour herself a drink. 11/07: Pt. Has difficulty holding a coffee pot when filling the reservoir 20th visit: Pt. Is making waffles in the toaster, heats items in in the microwave, Pt. Uses the oven when supervised.  Goal status: Ongoing  CLINICAL IMPRESSION:  Patient presents with limited left shoulder active range of motion. Patient was able to tolerate the exercises well, and tolerated stretches/exercises with the pulley. Pt. education was provided about proper pulley set up for safe exercise performance. Pt. tolerated the  theraband exercises well, and required the hand weight to be modified from 3# to 2#. Pt. presents with difficulty initially with pressing the resistive beads together. Pt. continues to work on improving left UE functioning in order to work towards improving LUE engagement in, and maximizing independence with ADLs, and IADL tasks.        PERFORMANCE DEFICITS in functional skills including ADLs, IADLs, coordination, dexterity, sensation, ROM, strength, FMC, decreased knowledge of use of DME, vision, and UE functional use, cognitive skills including attention, and psychosocial skills including coping strategies, environmental adaptation, habits, and routines and behaviors.   IMPAIRMENTS are limiting patient from ADLs, IADLs, and social participation.   COMORBIDITIES may have co-morbidities  that affects occupational performance. Patient will benefit from skilled OT to address above impairments and improve overall function.  MODIFICATION OR ASSISTANCE TO COMPLETE EVALUATION: Min-Moderate modification of tasks or assist with assess necessary to complete an evaluation.  OT OCCUPATIONAL PROFILE AND HISTORY: Detailed assessment: Review of records and additional review of physical, cognitive, psychosocial history related to current functional performance.  CLINICAL DECISION MAKING: Moderate - several treatment options, min-mod task modification necessary  REHAB POTENTIAL: Good  EVALUATION COMPLEXITY: Moderate    PLAN: OT FREQUENCY: 2x/week  OT DURATION: 12 weeks  PLANNED INTERVENTIONS: self care/ADL training, therapeutic exercise, therapeutic activity, neuromuscular re-education, manual therapy, passive range of motion, and paraffin  RECOMMENDED OTHER SERVICES: ST, and PT  CONSULTED AND AGREED WITH PLAN OF CARE: Patient  PLAN FOR NEXT SESSION:  L hand strengthening and coordination exercises  Olegario Messier, MS, OTR/L

## 2022-06-24 NOTE — Therapy (Signed)
OUTPATIENT PHYSICAL THERAPY NEURO TREATMENT/RECERTIFICATION    Patient Name: Alyssa Mayo MRN: [id number] DOB:May 23, 1946, 76 y.o., female Today's Date: 06/24/2022   PCP: [place] Pink, MD REFERRING PROVIDER: Frann Rider, NP   PT End of Session - 06/24/22 1145     Visit Number 19    Number of Visits 25    Date for PT Re-Evaluation 09/16/22    Authorization Type [employer]    Authorization Time Period 04/02/2022-06/25/2022; 06/24/2022-09/16/2022    Progress Note Due on Visit 20    PT Start Time 1148    PT Stop Time 1228    PT Time Calculation (min) 40 min    Equipment Utilized During Treatment Gait belt    Activity Tolerance Patient tolerated treatment well    Behavior During Therapy [facility] for tasks assessed/performed                  Past Medical History:  Diagnosis Date   Abnormal levels of other serum enzymes 07/17/2015   Arthritis    Constipation 07/11/2015   COVID-19 03/06/2021   Diabetes mellitus without complication ([place])    Elevated liver enzymes    Fatty liver    Generalized abdominal pain 07/11/2015   Hyperlipidemia    Hypertension    Lifelong obesity    Macular degeneration    Morbid obesity due to excess calories ([place]) 07/11/2015   Other fatigue 07/11/2015   Sleep apnea    Spinal headache    Past Surgical History:  Procedure Laterality Date   ABDOMINAL HYSTERECTOMY     APPENDECTOMY     CATARACT EXTRACTION     CERVICAL LAMINECTOMY  07/21/1985   CESAREAN SECTION     COLONOSCOPY  07/21/2012   diverticulosis, [facility] Dr. Donnella Sham    COLONOSCOPY WITH PROPOFOL N/A 02/04/2019   Procedure: COLONOSCOPY WITH PROPOFOL;  Surgeon: Lollie Sails, MD;  Location: [facility] ENDOSCOPY;  Service: Endoscopy;  Laterality: N/A;   COLONOSCOPY WITH PROPOFOL N/A 03/18/2021   Procedure: COLONOSCOPY WITH PROPOFOL;  Surgeon: Lesly Rubenstein, MD;  Location: [facility] ENDOSCOPY;  Service: Endoscopy;  Laterality: N/A;  DM   DIAGNOSTIC LAPAROSCOPY      ESOPHAGOGASTRODUODENOSCOPY (EGD) WITH PROPOFOL N/A 02/04/2019   Procedure: ESOPHAGOGASTRODUODENOSCOPY (EGD) WITH PROPOFOL;  Surgeon: Lollie Sails, MD;  Location: [facility] ENDOSCOPY;  Service: Endoscopy;  Laterality: N/A;   ESOPHAGOGASTRODUODENOSCOPY (EGD) WITH PROPOFOL N/A 03/18/2021   Procedure: ESOPHAGOGASTRODUODENOSCOPY (EGD) WITH PROPOFOL;  Surgeon: Lesly Rubenstein, MD;  Location: [facility] ENDOSCOPY;  Service: Endoscopy;  Laterality: N/A;   LOOP RECORDER INSERTION N/A 08/05/2021   Procedure: LOOP RECORDER INSERTION;  Surgeon: Evans Lance, MD;  Location: [place] CV LAB;  Service: Cardiovascular;  Laterality: N/A;   [place]     TUBAL LIGATION     Patient Active Problem List   Diagnosis Date Noted   Cerebral edema ([place]) 08/07/2021   OSA (obstructive sleep apnea) 08/07/2021   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   CVA (cerebral vascular accident) ([place]) 08/01/2021   GERD (gastroesophageal reflux disease) 07/20/2019   Advanced care planning/counseling discussion 12/17/2016   Skin lesions, generalized 11/13/2016   Chronic right hip pain 06/17/2016   Vitamin D deficiency 09/26/2015   Diastasis recti 08/08/2015   Elevated serum GGT level 07/24/2015   Essential hypertension 07/17/2015   Fatty liver 07/17/2015   Elevated alkaline phosphatase level 07/17/2015   Elevated serum glutamic pyruvic transaminase (SGPT) level 07/17/2015   Abnormal finding on EKG 07/11/2015   Morbid obesity ([place]) 07/11/2015  Colon, diverticulosis 07/11/2015   Abdominal wall hernia 07/11/2015   DM (diabetes mellitus), type 2 with neurological complications [facility])    Hyperlipidemia     ONSET DATE: January 11th 2023  REFERRING DIAG:  I69.398,R26.9 (ICD-10-CM) - Gait disturbance, post-stroke  I63.511 (ICD-10-CM) - Right middle cerebral artery stroke (HCC)    THERAPY DIAG:  Muscle weakness (generalized)  Other lack of coordination  Difficulty in walking, not elsewhere  classified  Unsteadiness on feet  Right middle cerebral artery stroke [facility])  Rationale for Evaluation and Treatment Rehabilitation  SUBJECTIVE:  Pt reports doing well overall. States no pain and no falls. Did report a near fall yesterday- stepped onto some bedding. States she still feels like balance and stamina are lacking.    Pt accompanied by: self  PERTINENT HISTORY: Per chart and confirmed by pt:  Pt is a 76 yo female with RMCA CVA infarction with hemorrhagic transformation 08/01/2021, with inpatient rehab 08/07/2021-08/22/2021 followed by [facility] therapy until June. Pt now currently being seen for outpatient OT and ST services. Pt reports limitations in stamina persist. Other PMH is significant for HTN, HLD, macular degeneration, DM, obesity, sleep apnea, arthritis, chronic R hip pain, vitamin D deficiency, diastasis recti, abdominal wall hernia, hx of abdominal hysterectomy, appendectomy, spine surgery (years ago).   PAIN:  Are you having pain? No Pain Location:     TODAY'S TREATMENT:   Therapeutic Activities:   Reassessed all goals for recert visit- See goal section for details as well as below data:   *Retested 6 min walk test as patient reported some difficulty with stamina- 940 feet today - declined from 1027 on 11/1 but overall improved since eval.        PATIENT EDUCATION: Education details: exam findings, indications, plan, HEP Person educated: Patient Education method: Explanation, Demonstration, Verbal cues, and Handouts Education comprehension: verbalized understanding, returned demonstration, verbal cues required, and needs further education   HOME EXERCISE PROGRAM: Access Code: [email or web address] URL: [email or web address] Date: 06/02/2022 Prepared by: [name]  Exercises - Standing Single Leg Stance with Counter Support  - 2 x daily - 7 x weekly - 1 sets - 10 reps - 10s  hold - Standing Marching  - 2 x daily - 7 x weekly - 2 sets - 20 reps -  Side Stepping with Counter Support  - 2 x daily - 7 x weekly - 2 sets - 20 reps - Walking  - 2 x daily - 7 x weekly    GOALS: Goals reviewed with patient? Yes  SHORT TERM GOALS: Target date: 08/05/2022  Patient will be independent in home exercise program to improve strength/mobility for better functional independence with ADLs. Baseline: initiated; Patient reports walking but not always independent with progressive HEP. Will keep goal active to anticipate more balance ex for HEP.  Goal status: PROGRESSING   LONG TERM GOALS: Target date: 09/16/2022  Patient will increase FOTO score to equal to or greater than 65  to demonstrate improvement in mobility and quality of life.  Baseline: 57; 06/24/2022= 61 Goal status: PROGRESSING  2.  Patient (> 87 years old) will complete five times sit to stand test in < 15 seconds indicating an increased LE strength and improved balance. Baseline: 17 sec hands-free; 11/1: 15.36 sec; 06/24/2022= 13.56 sec  Goal status: GOAL MET  3.  Patient will increase 10 meter walk test to >1.51ms as to improve gait speed for better community ambulation and to reduce fall risk. Baseline: 0.66 m/s; 11/1:  0.85 m/s; 06/24/2022=  0.97 m/s without and AD Goal status: IN-PROGRESS  4.  Patient will increase Berg Balance score by > 3 points to demonstrate decreased fall risk during functional activities. Baseline: 50/56; 06/24/2022= 52/56 Goal status: PROGRESSING  5.  Patient will increase six minute walk test distance to >1000 for progression to community ambulator and improve gait ability Baseline: 04/10/22: 661f (pain onset at 5 minutes); 05/21/22: 1027 ft; 06/23/2022= 940 feet - will keep goal active until patient able to consistently achieve > 1000 feet Goal status: Partially MET    ASSESSMENT:  CLINICAL IMPRESSION:  Patient presents with good motivation for today's session. She presents with improvement overall- meeting her current LE functional strength but may  need future tesing into new cert to see if patient able to perform consistent. Patient improved in self perceived abilities as seen by improved FOTO score. She also demo improved BERG balance test score with no reported falls. She may benefit from further testing higher level balance - FGA or MiniBEST to identify any more balance issues. Patient also demonstrating improved gait speed without an AD. She had a slight decline in her 6 min walk test and she will benefit from improved muscle strengthening necessary for endurance and other balance related tasks.  Patient's condition has the potential to improve in response to therapy. Maximum improvement is yet to be obtained. The anticipated improvement is attainable and reasonable in a generally predictable time.   Pt will continue to benefit from skilled therapy to address remaining deficits in order to improve overall QoL and return to PLOF.        OBJECTIVE IMPAIRMENTS Abnormal gait, decreased activity tolerance, decreased balance, decreased coordination, decreased endurance, decreased mobility, difficulty walking, decreased strength, impaired sensation, impaired UE functional use, improper body mechanics, postural dysfunction, and pain.   ACTIVITY LIMITATIONS carrying, lifting, bending, standing, squatting, stairs, bed mobility, bathing, reach over head, hygiene/grooming, and locomotion level  PARTICIPATION LIMITATIONS: meal prep, cleaning, laundry, medication management, personal finances, driving, shopping, community activity, and yard work  PERSONAL FACTORS Age, Sex, Time since onset of injury/illness/exacerbation, and 3+ comorbidities: Other PMH is significant for HTN, HLD, macular degeneration, DM, obesity, sleep apnea, arthritis, chronic R hip pain, vitamin D deficiency, diastasis recti, abdominal wall hernia, hx of abdominal hysterectomy, appendectomy, spine surgery (years ago).   are also affecting patient's functional outcome.   REHAB  POTENTIAL: Good  CLINICAL DECISION MAKING: Evolving/moderate complexity  EVALUATION COMPLEXITY: Moderate  PLAN: PT FREQUENCY: 2x/week  PT DURATION: 12 weeks  PLANNED INTERVENTIONS: Therapeutic exercises, Therapeutic activity, Neuromuscular re-education, Balance training, Gait training, Patient/Family education, Self Care, Joint mobilization, Joint manipulation, Stair training, Vestibular training, Canalith repositioning, Orthotic/Fit training, DME instructions, Dry Needling, Electrical stimulation, Wheelchair mobility training, Spinal mobilization, Cryotherapy, Moist heat, Splintting, Taping, Traction, Ultrasound, Biofeedback, Manual therapy, and Re-evaluation  PLAN FOR NEXT SESSION:   LE strengthening: leg press/squats/etc. HEP compliance and introduce more cognitive related tasks.   JOllen Bowl PT Physical Therapist- [facility] 06/24/22, 2:05 PM

## 2022-06-26 ENCOUNTER — Encounter: Payer: Medicare HMO | Admitting: Speech Pathology

## 2022-06-26 ENCOUNTER — Ambulatory Visit: Payer: Medicare HMO | Admitting: Occupational Therapy

## 2022-06-26 ENCOUNTER — Ambulatory Visit: Payer: Medicare HMO

## 2022-06-26 DIAGNOSIS — R278 Other lack of coordination: Secondary | ICD-10-CM

## 2022-06-26 DIAGNOSIS — M6281 Muscle weakness (generalized): Secondary | ICD-10-CM

## 2022-06-26 DIAGNOSIS — I63511 Cerebral infarction due to unspecified occlusion or stenosis of right middle cerebral artery: Secondary | ICD-10-CM

## 2022-06-26 DIAGNOSIS — R262 Difficulty in walking, not elsewhere classified: Secondary | ICD-10-CM

## 2022-06-26 DIAGNOSIS — R2681 Unsteadiness on feet: Secondary | ICD-10-CM

## 2022-06-26 DIAGNOSIS — R41841 Cognitive communication deficit: Secondary | ICD-10-CM | POA: Diagnosis not present

## 2022-06-26 NOTE — Therapy (Signed)
OUTPATIENT PHYSICAL THERAPY NEURO TREATMENT/PHYSICAL THERAPY PROGRESS NOTE   Dates of reporting period  05/21/22   to   06/26/22    Patient Name: Alyssa Mayo MRN: [id number] DOB:1946-04-12, 76 y.o., female Today's Date: 06/26/2022   PCP: [place] Pink, MD REFERRING PROVIDER: Frann Rider, NP   PT End of Session - 06/26/22 1057     Visit Number 20    Number of Visits 25    Date for PT Re-Evaluation 09/16/22    Authorization Type [employer] Medicare    Authorization Time Period 04/02/2022-06/25/2022; 06/24/2022-09/16/2022    Progress Note Due on Visit 20    PT Start Time 1058    PT Stop Time 1145    PT Time Calculation (min) 47 min    Equipment Utilized During Treatment Gait belt    Activity Tolerance Patient tolerated treatment well    Behavior During Therapy [facility] for tasks assessed/performed              Past Medical History:  Diagnosis Date   Abnormal levels of other serum enzymes 07/17/2015   Arthritis    Constipation 07/11/2015   COVID-19 03/06/2021   Diabetes mellitus without complication ([place])    Elevated liver enzymes    Fatty liver    Generalized abdominal pain 07/11/2015   Hyperlipidemia    Hypertension    Lifelong obesity    Macular degeneration    Morbid obesity due to excess calories ([place]) 07/11/2015   Other fatigue 07/11/2015   Sleep apnea    Spinal headache    Past Surgical History:  Procedure Laterality Date   ABDOMINAL HYSTERECTOMY     APPENDECTOMY     CATARACT EXTRACTION     CERVICAL LAMINECTOMY  07/21/1985   CESAREAN SECTION     COLONOSCOPY  07/21/2012   diverticulosis, [facility] Dr. Donnella Sham    COLONOSCOPY WITH PROPOFOL N/A 02/04/2019   Procedure: COLONOSCOPY WITH PROPOFOL;  Surgeon: Lollie Sails, MD;  Location: [facility] ENDOSCOPY;  Service: Endoscopy;  Laterality: N/A;   COLONOSCOPY WITH PROPOFOL N/A 03/18/2021   Procedure: COLONOSCOPY WITH PROPOFOL;  Surgeon: Lesly Rubenstein, MD;  Location: [facility] ENDOSCOPY;  Service: Endoscopy;   Laterality: N/A;  DM   DIAGNOSTIC LAPAROSCOPY     ESOPHAGOGASTRODUODENOSCOPY (EGD) WITH PROPOFOL N/A 02/04/2019   Procedure: ESOPHAGOGASTRODUODENOSCOPY (EGD) WITH PROPOFOL;  Surgeon: Lollie Sails, MD;  Location: [facility] ENDOSCOPY;  Service: Endoscopy;  Laterality: N/A;   ESOPHAGOGASTRODUODENOSCOPY (EGD) WITH PROPOFOL N/A 03/18/2021   Procedure: ESOPHAGOGASTRODUODENOSCOPY (EGD) WITH PROPOFOL;  Surgeon: Lesly Rubenstein, MD;  Location: [facility] ENDOSCOPY;  Service: Endoscopy;  Laterality: N/A;   LOOP RECORDER INSERTION N/A 08/05/2021   Procedure: LOOP RECORDER INSERTION;  Surgeon: Evans Lance, MD;  Location: [place] CV LAB;  Service: Cardiovascular;  Laterality: N/A;   SPINE SURGERY     TUBAL LIGATION     Patient Active Problem List   Diagnosis Date Noted   Cerebral edema ([place]) 08/07/2021   OSA (obstructive sleep apnea) 08/07/2021   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   CVA (cerebral vascular accident) ([place]) 08/01/2021   GERD (gastroesophageal reflux disease) 07/20/2019   Advanced care planning/counseling discussion 12/17/2016   Skin lesions, generalized 11/13/2016   Chronic right hip pain 06/17/2016   Vitamin D deficiency 09/26/2015   Diastasis recti 08/08/2015   Elevated serum GGT level 07/24/2015   Essential hypertension 07/17/2015   Fatty liver 07/17/2015   Elevated alkaline phosphatase level 07/17/2015   Elevated serum glutamic pyruvic transaminase (SGPT) level 07/17/2015  Abnormal finding on EKG 07/11/2015   Morbid obesity ([place]) 07/11/2015   Colon, diverticulosis 07/11/2015   Abdominal wall hernia 07/11/2015   DM (diabetes mellitus), type 2 with neurological complications [facility])    Hyperlipidemia     ONSET DATE: January 11th 2023  REFERRING DIAG:  I69.398,R26.9 (ICD-10-CM) - Gait disturbance, post-stroke  I63.511 (ICD-10-CM) - Right middle cerebral artery stroke (HCC)    THERAPY DIAG:  Muscle weakness (generalized)  Other lack of  coordination  Difficulty in walking, not elsewhere classified  Unsteadiness on feet  Right middle cerebral artery stroke [facility])  Rationale for Evaluation and Treatment Rehabilitation  SUBJECTIVE:  Pt performing well with her exercises at home.  Pt denies any falls since the last time visiting.   Pt accompanied by: self  PERTINENT HISTORY: Per chart and confirmed by pt:  Pt is a 76 yo female with RMCA CVA infarction with hemorrhagic transformation 08/01/2021, with inpatient rehab 08/07/2021-08/22/2021 followed by [facility] therapy until June. Pt now currently being seen for outpatient OT and ST services. Pt reports limitations in stamina persist. Other PMH is significant for HTN, HLD, macular degeneration, DM, obesity, sleep apnea, arthritis, chronic R hip pain, vitamin D deficiency, diastasis recti, abdominal wall hernia, hx of abdominal hysterectomy, appendectomy, spine surgery (years ago).   PAIN:  Are you having pain? No Pain Location:     TODAY'S TREATMENT:  Neuro:  Ambulation around the gym with 4# AW's donned, x4 laps   All [employer] activity performed with 4# AW's donned:  [facility] activity with 5 pods utilized, 60 sec stints with 45 sec rest periods, x4 cycles total.  1st cycle: 5 hits; avg reaction time: 4933 2nd cycle: 6 hits; ave reaction time: 4462 3rd cycle: 6 hits; avg reaction time: 4718 4th cycle: 6 hits; avg reaction time: 3851   [employer] Focus Activity with 6 pods utilized, 60 sec stints with 45 sec rest periods, x4 cycles total  1st cycle: 20 hits; avg reaction time: 1839 2nd cycle: 23 hits; avg reaction time: 1529 3rd cycle: 21 hits; avg reaction time: 1745 4th cycle: 23 hits; avg reaction time: 1396; 1 strike   [employer] Random Activity with 6 pods utilized, 60 sec stint, x1 cycle total  1st cycle: 34 hits; avg reaction time 1532     PATIENT EDUCATION: Education details: exam findings, indications, plan, HEP Person educated:  Patient Education method: Consulting civil engineer, Demonstration, Verbal cues, and Handouts Education comprehension: verbalized understanding, returned demonstration, verbal cues required, and needs further education   HOME EXERCISE PROGRAM: Access Code: [email or web address] URL: [email or web address] Date: 06/02/2022 Prepared by: [name]  Exercises - Standing Single Leg Stance with Counter Support  - 2 x daily - 7 x weekly - 1 sets - 10 reps - 10s  hold - Standing Marching  - 2 x daily - 7 x weekly - 2 sets - 20 reps - Side Stepping with Counter Support  - 2 x daily - 7 x weekly - 2 sets - 20 reps - Walking  - 2 x daily - 7 x weekly    GOALS: Goals reviewed with patient? Yes  SHORT TERM GOALS: Target date: 08/07/2022  Patient will be independent in home exercise program to improve strength/mobility for better functional independence with ADLs. Baseline: initiated; Patient reports walking but not always independent with progressive HEP. Will keep goal active to anticipate more balance ex for HEP.  Goal status: PROGRESSING   LONG TERM GOALS: Target date: 09/18/2022  Patient will increase FOTO score to  equal to or greater than 65  to demonstrate improvement in mobility and quality of life.  Baseline: 57 06/24/2022= 61 Goal status: IN PROGRESS  2.  Patient (> 57 years old) will complete five times sit to stand test in < 15 seconds indicating an increased LE strength and improved balance. Baseline: 17 sec hands-free 05/21/2022 - 15.36 sec 06/24/2022 - 13.56 sec  Goal status: GOAL MET  3.  Patient will increase 10 meter walk test to >1.40ms as to improve gait speed for better community ambulation and to reduce fall risk. Baseline: 0.66 m/s; 11/1:  0.85 m/s 06/24/2022= 0.97 m/s without and AD Goal status: IN PROGRESS  4.  Patient will increase Berg Balance score by > 3 points to demonstrate decreased fall risk during functional activities. Baseline: 50/56 06/24/2022= 52/56 Goal  status: IN PROGRESS  5.  Patient will increase six minute walk test distance to >1000 for progression to community ambulator and improve gait ability Baseline: 04/10/22: 6746f(pain onset at 5 minutes) 05/21/22: 1027 ft 06/23/2022= 940 feet - will keep goal active until patient able to consistently achieve > 1000 feet Goal status: IN PROGRESS    ASSESSMENT:  CLINICAL IMPRESSION:  Goal assessment performed earlier this week and are as noted above.  Pt has made significant strides towards goals at this time.  Pt has been able to achieve 1/6 goals completely, however has made progress in all 6 goals.  Pt currently demonstrating increased balance and strength, while lacking adequate endurance when ambulating.  Patient's condition has the potential to improve in response to therapy. Maximum improvement is yet to be obtained. The anticipated improvement is attainable and reasonable in a generally predictable time.  Pt will continue to benefit from skilled therapy to address remaining deficits in order to improve overall QoL and return to PLOF.         OBJECTIVE IMPAIRMENTS Abnormal gait, decreased activity tolerance, decreased balance, decreased coordination, decreased endurance, decreased mobility, difficulty walking, decreased strength, impaired sensation, impaired UE functional use, improper body mechanics, postural dysfunction, and pain.   ACTIVITY LIMITATIONS carrying, lifting, bending, standing, squatting, stairs, bed mobility, bathing, reach over head, hygiene/grooming, and locomotion level  PARTICIPATION LIMITATIONS: meal prep, cleaning, laundry, medication management, personal finances, driving, shopping, community activity, and yard work  PERSONAL FACTORS Age, Sex, Time since onset of injury/illness/exacerbation, and 3+ comorbidities: Other PMH is significant for HTN, HLD, macular degeneration, DM, obesity, sleep apnea, arthritis, chronic R hip pain, vitamin D deficiency, diastasis recti,  abdominal wall hernia, hx of abdominal hysterectomy, appendectomy, spine surgery (years ago).   are also affecting patient's functional outcome.   REHAB POTENTIAL: Good  CLINICAL DECISION MAKING: Evolving/moderate complexity  EVALUATION COMPLEXITY: Moderate  PLAN: PT FREQUENCY: 2x/week  PT DURATION: 12 weeks  PLANNED INTERVENTIONS: Therapeutic exercises, Therapeutic activity, Neuromuscular re-education, Balance training, Gait training, Patient/Family education, Self Care, Joint mobilization, Joint manipulation, Stair training, Vestibular training, Canalith repositioning, Orthotic/Fit training, DME instructions, Dry Needling, Electrical stimulation, Wheelchair mobility training, Spinal mobilization, Cryotherapy, Moist heat, Splintting, Taping, Traction, Ultrasound, Biofeedback, Manual therapy, and Re-evaluation  PLAN FOR NEXT SESSION:   LE strengthening: leg press/squats/etc. HEP compliance and introduce more cognitive related tasks.   JoGwenlyn SaranPT, DPT Physical Therapist- [facility]12/07/23, 1:32 PM

## 2022-06-26 NOTE — Therapy (Signed)
Occupational Therapy Treatment Note  Patient Name: Alyssa Mayo MRN: [id number] DOB:July 21, 1946, 76 y.o., female Today's Date: 03/14/2022  PCP: [name], MD REFERRING PROVIDER: [name], NP    OT End of Session - 06/26/22 1217     Visit Number 22    Number of Visits 48    Date for OT Re-Evaluation 08/19/22    Authorization Type Progress reporting period starting 06/19/2022    OT Start Time 1145    OT Stop Time 1230    OT Time Calculation (min) 45 min    Activity Tolerance Patient tolerated treatment well    Behavior During Therapy [facility] for tasks assessed/performed                         Past Medical History:  Diagnosis Date   Abnormal levels of other serum enzymes 07/17/2015   Arthritis    Constipation 07/11/2015   COVID-19 03/06/2021   Diabetes mellitus without complication (HCC)    Elevated liver enzymes    Fatty liver    Generalized abdominal pain 07/11/2015   Hyperlipidemia    Hypertension    Lifelong obesity    Macular degeneration    Morbid obesity due to excess calories (HCC) 07/11/2015   Other fatigue 07/11/2015   Sleep apnea    Spinal headache    Past Surgical History:  Procedure Laterality Date   ABDOMINAL HYSTERECTOMY     APPENDECTOMY     CATARACT EXTRACTION     CERVICAL LAMINECTOMY  07/21/1985   CESAREAN SECTION     COLONOSCOPY  07/21/2012   diverticulosis, [facility] Dr. [name]    COLONOSCOPY WITH PROPOFOL N/A 02/04/2019   Procedure: COLONOSCOPY WITH PROPOFOL;  Surgeon: [name], MD;  Location: [facility] ENDOSCOPY;  Service: Endoscopy;  Laterality: N/A;   COLONOSCOPY WITH PROPOFOL N/A 03/18/2021   Procedure: COLONOSCOPY WITH PROPOFOL;  Surgeon: [name], MD;  Location: [facility] ENDOSCOPY;  Service: Endoscopy;  Laterality: N/A;  DM   DIAGNOSTIC LAPAROSCOPY     ESOPHAGOGASTRODUODENOSCOPY (EGD) WITH PROPOFOL N/A 02/04/2019   Procedure: ESOPHAGOGASTRODUODENOSCOPY (EGD) WITH PROPOFOL;  Surgeon: [name], MD;  Location: [facility] ENDOSCOPY;  Service: Endoscopy;  Laterality: N/A;   ESOPHAGOGASTRODUODENOSCOPY (EGD) WITH PROPOFOL N/A 03/18/2021   Procedure: ESOPHAGOGASTRODUODENOSCOPY (EGD) WITH PROPOFOL;  Surgeon: [name], MD;  Location: [facility] ENDOSCOPY;  Service: Endoscopy;  Laterality: N/A;   LOOP RECORDER INSERTION N/A 08/05/2021   Procedure: LOOP RECORDER INSERTION;  Surgeon: [name], MD;  Location: [facility] INVASIVE CV LAB;  Service: Cardiovascular;  Laterality: N/A;   SPINE SURGERY     TUBAL LIGATION     Patient Active Problem List   Diagnosis Date Noted   Cerebral edema (HCC) 08/07/2021   OSA (obstructive sleep apnea) 08/07/2021   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   CVA (cerebral vascular accident) (HCC) 08/01/2021   GERD (gastroesophageal reflux disease) 07/20/2019   Advanced care planning/counseling discussion 12/17/2016   Skin lesions, generalized 11/13/2016   Chronic right hip pain 06/17/2016   Vitamin D deficiency 09/26/2015   Diastasis recti 08/08/2015   Elevated serum GGT level 07/24/2015   Essential hypertension 07/17/2015   Fatty liver 07/17/2015   Elevated alkaline phosphatase level 07/17/2015   Elevated serum glutamic pyruvic transaminase (SGPT) level 07/17/2015   Abnormal finding on EKG 07/11/2015   Morbid obesity (HCC) 07/11/2015   Colon, diverticulosis 07/11/2015   Abdominal wall hernia 07/11/2015   DM (  diabetes mellitus), type 2 with neurological complications (HCC)    Hyperlipidemia     ONSET DATE: 08/01/2021  REFERRING DIAG: CVA  THERAPY DIAG:  Muscle weakness (generalized)  Other lack of coordination  Rationale for Evaluation and Treatment Rehabilitation  SUBJECTIVE:   SUBJECTIVE STATEMENT:   Pt. reports  doing well today   Pt accompanied by: self  PERTINENT HISTORY:  Pt. is a 76 y.o. female who presents with a diagnosis of RMCA CVA infarction with hemorrhagic transformation. Pt. attended inpatient rehab from  08/07/2021-08/22/2021. Pt. received Home health therapy services this past spring.  Pt. has recently had an assessment through driver rehabilitation services at [facility] with recommendations for referrals for  outpatient OT/PT, and ST services. Pt. PMHx includes: HTN, Hyperlipidemia, Macular degeneration, DM, and Obesity.  PRECAUTIONS: None  WEIGHT BEARING RESTRICTIONS No  PAIN:  Are you having pain? No.    FALLS: Has patient fallen in last 6 months? Yes.  LIVING ENVIRONMENT: Lives with: lives with their family  Son  [name] Lives in: House/apartment Main living are on one floor Stairs:  2 steps to enter Has following equipment at home: Single point cane, Wheelchair (manual), shower chair, Grab bars, bed rail, and rubber mat.  PLOF: Independent  PATIENT GOALS: To be able to Drive, and cook again  OBJECTIVE:   HAND DOMINANCE: Right   ADLs: Overall ADLs:  Transfers/ambulation related to ADLs: Independent Eating: Independent Grooming: Pt. Fatigues with sustained BUE's in elevation for haircare. Especially with blow drying hair. UB Dressing: Independent pullover shirt, independent now with fastening a bra LB Dressing: Independent donning pants socks, and slide on shoes Toileting: Independent Bathing: Independent Tub Shower transfers: Walk-in shower Independent now   IADLs: Shopping: Needs to be accompanied to the grocery store. Light housekeeping: Does own laundry, laundry,  Meal Prep:  Difficulty with cooking Community mobility: Relies on son, and friend Medication management: Son sets up Mining engineer management: Manages monthly bills independently. Handwriting: No change from baseline  MOBILITY STATUS: Hx of falls out of bed   ACTIVITY TOLERANCE: Activity tolerance:  10-20 min. Before rest break  FUNCTIONAL OUTCOME MEASURES: FOTO: 57  TR score: 62  UPPER EXTREMITY ROM     Active ROM Right eval Left eval Left 05/06/2022 Left 05/27/2022 Left 06/19/2022   Shoulder  flexion WFL 96(110) 107(110) 109(120) 112(126)  Shoulder abduction WFL 82(105) 86(105) 92(115) 94(115)  Shoulder adduction       Shoulder extension       Shoulder internal rotation       Shoulder external rotation       Elbow flexion [facility] WFL  Elbow extension [facility] WFL  Wrist flexion WFL 44(60) 68(68) WFL WFL  Wrist extension WFL 32(60) 66(66) WFL WFL  Wrist ulnar deviation       Wrist radial deviation       Wrist pronation       Wrist supination       (Blank rows = not tested)   UPPER EXTREMITY MMT:     MMT Right eval Left eval Left 05/06/2022 Left 05/27/2022 Left 06/19/2022  Shoulder flexion 4+/5 3-/5 3+/5 3+/5 3+/5  Shoulder abduction 4+/5 3-/5 3-/5 3+/5 3+/5  Shoulder adduction       Shoulder extension       Shoulder internal rotation       Shoulder external rotation       Middle trapezius       Lower trapezius  Elbow flexion 4+/5 4-/5 4+/5 5/5 5/5  Elbow extension 4+/5 4-/5 4+/5 5/5 5/5  Wrist flexion 4+/5 3-/5 4/5 4+/5 4+/5  Wrist extension 4+/5 3/5 4/5 4+/5 4+/5  Wrist ulnar deviation       Wrist radial deviation       Wrist pronation       Wrist supination       (Blank rows = not tested)  HAND FUNCTION: Grip strength: Right: 30 lbs; Left: 13 lbs, Lateral pinch: Right: 15 lbs, Left: 8 lbs, and 3 point pinch: Right: 13 lbs, Left: 7 lbs  05/06/2022 Grip strength: Right: 30 lbs; Left: 14 lbs, Lateral pinch: Right: 15 lbs, Left: 10 lbs, and 3 point pinch: Right: 13 lbs, Left: 7 lbs  05/27/2022:  Grip strength: Right: 30 lbs; Left: 17 lbs, Lateral pinch: Right: 15 lbs, Left: 11 lbs, and 3 point pinch: Right: 13 lbs, Left: 7 lbs  06/19/2022:  Grip strength: Right: 30 lbs; Left: 18 lbs, Lateral pinch: Right: 15 lbs, Left:  10 lbs, and 3 point pinch: Right: 13 lbs, Left: 8 lbs   COORDINATION:  9 Hole Peg test: Right: 26  sec; Left: 1 min. & 9 sec  05/06/2022 9 Hole Peg test: Right: 26  sec; Left: 1 min.  05/27/2022  9 Hole Peg  test: Right: 26  sec; Left: 1 min. 4 sec.   06/19/2022  9 Hole Peg test: Right: 26  sec; Left: 1 min.   SENSATION: Light touch: WFL Proprioception: WFL  COGNITION: Overall cognitive status: Within functional limits for tasks assessed  VISION: Subjective report: Glasses. Just received a new prescription to increase bifocal strength Baseline vision: Macular Degeneration Visual history: macular degeneration  VISION ASSESSMENT: To be further assessed in functional context  Left sided Awareness    PERCEPTION: Limited left sided awareness   TODAY'S TREATMENT:     Neuro muscular re-education:   Pt. worked on [facility] skills grasping 1" sticks, and 1/4" collars. Pt. worked on storing the objects in the palm, and translatory skills moving the  items from the   palm of the hand to the tip of the 2nd digit, and thumb. Pt. worked on removing the pegs using bilateral alternating hand patterns.   Therapeutic Activities:  Pt. visual motor skills copying design pattens from simple design patterns progressively to more complex design patterns.  .   Patient was able to copy each of the design patterns, however presented with impaired spacing within the designs, and presented with limitations within the design pattern. Pt. continues to present with decreased right hand [facility] skills. Pt. continues to work on improving left UE function in order to work towards improving LUE engagement in, and maximizing independence with ADLs, and IADL tasks.        PATIENT EDUCATION: Education details: Kitchen safety/use of external memory aids to turn off burners, limit distractions when in kitchen Person educated: Patient Education method: verbal cues Education comprehension: verbalized understanding and pt does not plan to cook unless son is present.   HOME EXERCISE PROGRAM:  Continue with an ongoing assessment of HEP needs    GOALS: Goals reviewed with patient? Yes   SHORT TERM GOALS: Target date:   04/15/2022      Pt. Will improve FOTO score by 2 points to reflect improved pt. perceived functional performance Baseline: FOTO: 57, TR score: 62 10th visit: FOTO: 65, 20th visit: FOTO 62, TR score: 62 Goal status: Achieved  LONG TERM GOALS: Target date: 08/19/2022  Pt. Will improve left UE strength by 2 mm grades to assist with repositioning her grandson on her lap. Baseline: Eval: left shoulder flexion: 3-/5, abduction 3-/5,  elbow flex/ext. 4-/5, wrist extension 3/5, wrist flexion 3-/5  10th visit: left shoulder flexion: 3+/5, abduction 3-+5,  elbow flex/ext. 4+/5, wrist extension 4/5, wrist flexion 4/5 05/27/2022: left shoulder flexion: 3+/5, abduction 3+/5,  elbow flex/ext. 5/5, wrist extension 4+/5, wrist flexion 4+/5  20th visit: left shoulder flexion: 3+/5, abduction 3+/5,  elbow flex/ext. 5/5, wrist extension 4+/5, wrist flexion 4+/5. Pt. Can now hold her grandson grandson on her lap, and reposition him. Pt. Is unable to hold him in standing. Goal status: Ongoing  2.  Pt. will improve left grip strength by 5# to be able to independently hold and use a blow dryer, and brush for hair care. Baseline: Eval: Left grip strength 13#. Pt. Has difficulty performing hair care, holding and using a brush, and blow dryer. 10th visit: Left grip 14#. Pt. Is now able to hold th e blow dryer for the sides, and top of head, not the back. 11/07: Left 17# Pt. Has difficulty blow drying then back of her head.20th visit: Left grip 18#. Pt. Is able to independently hold a brush, and blow dry her hair.  Goal status: Achieved  3.  Pt. Will improve left lateral pinch strength by 3# to be able to independently cut meat. Baseline: Eval: Left 8, Pt. Has difficulty cutting meat. 10th: Left: 10#.  Pt. Is able to cut tender meat, however is not as efficient 11/07: Left 11#. Pt. Is independent cutting meat with increased time. 20th visit: Left: 10#. Pt. Has difficulty positioning, and stabilizing the utensils while  cutting meat. Goal status:  Achieved  4.  Pt. Will improve Left hand [facility] skills to be able to be able to independently, and efficiently manipulate small objects during ADL tasks. Baseline: Eval: left: 1 min. 9 sec. 10th visit: Left: 1 min. Pt. Is improving with manipulating small objects. 11/07: Left: 1 min. & 4 sec. Pt. Has difficulty manipulating small objects. 20th visit: Left:  1 min. Pt. Is improving with grasping pills, however continues to have difficulty. Goal status: Ongoing   5.  Pt. Will perform light meal preparation with modified independence Baseline: Eval: Pt. Reports having difficulty performing light  meal preparation, and cooking tasks. 10th visit: Pt. Is able to make toast in the morning, a sandwich,  use the microwave, make coffee, and pour herself a drink. 11/07: Pt. Has difficulty holding a coffee pot when filling the reservoir 20th visit: Pt. Is making waffles in the toaster, heats items in in the microwave, Pt. Uses the oven when supervised.  Goal status: Ongoing  CLINICAL IMPRESSION:  Patient was able to copy each of the design patterns, however presented with impaired spacing within the designs, and presented with limitations within the design pattern. Pt. continues to present with decreased right hand [facility] skills. Pt. continues to work on improving left UE function in order to work towards improving LUE engagement in, and maximizing independence with ADLs, and IADL tasks.         PERFORMANCE DEFICITS in functional skills including ADLs, IADLs, coordination, dexterity, sensation, ROM, strength, FMC, decreased knowledge of use of DME, vision, and UE functional use, cognitive skills including attention, and psychosocial skills including coping strategies, environmental adaptation, habits, and routines and behaviors.   IMPAIRMENTS are limiting patient from ADLs, IADLs, and social participation.   COMORBIDITIES may have co-morbidities  that affects  occupational performance.  Patient will benefit from skilled OT to address above impairments and improve overall function.  MODIFICATION OR ASSISTANCE TO COMPLETE EVALUATION: Min-Moderate modification of tasks or assist with assess necessary to complete an evaluation.  OT OCCUPATIONAL PROFILE AND HISTORY: Detailed assessment: Review of records and additional review of physical, cognitive, psychosocial history related to current functional performance.  CLINICAL DECISION MAKING: Moderate - several treatment options, min-mod task modification necessary  REHAB POTENTIAL: Good  EVALUATION COMPLEXITY: Moderate    PLAN: OT FREQUENCY: 2x/week  OT DURATION: 12 weeks  PLANNED INTERVENTIONS: self care/ADL training, therapeutic exercise, therapeutic activity, neuromuscular re-education, manual therapy, passive range of motion, and paraffin  RECOMMENDED OTHER SERVICES: ST, and PT  CONSULTED AND AGREED WITH PLAN OF CARE: Patient  PLAN FOR NEXT SESSION:  L hand strengthening and coordination exercises  Olegario Messier, MS, OTR/L

## 2022-07-01 ENCOUNTER — Ambulatory Visit: Payer: Medicare HMO | Admitting: Occupational Therapy

## 2022-07-01 ENCOUNTER — Encounter: Payer: Self-pay | Admitting: Occupational Therapy

## 2022-07-01 ENCOUNTER — Ambulatory Visit: Payer: Medicare HMO | Admitting: Speech Pathology

## 2022-07-01 DIAGNOSIS — R41841 Cognitive communication deficit: Secondary | ICD-10-CM

## 2022-07-01 DIAGNOSIS — I63511 Cerebral infarction due to unspecified occlusion or stenosis of right middle cerebral artery: Secondary | ICD-10-CM

## 2022-07-01 DIAGNOSIS — M6281 Muscle weakness (generalized): Secondary | ICD-10-CM

## 2022-07-01 NOTE — Therapy (Signed)
Occupational Therapy Treatment Note  Patient Name: Alyssa Mayo MRN: [id number] DOB:28-Jun-1946, 76 y.o., female Today's Date: 03/14/2022  PCP: [place] Pink, MD REFERRING PROVIDER: Frann Rider, NP    OT End of Session - 07/01/22 1104     Visit Number 23    Number of Visits 48    Date for OT Re-Evaluation 08/19/22    Authorization Type Progress reporting period starting 06/19/2022    OT Start Time 1100    OT Stop Time 1145    OT Time Calculation (min) 45 min    Activity Tolerance Patient tolerated treatment well    Behavior During Therapy [facility] for tasks assessed/performed                           Past Medical History:  Diagnosis Date   Abnormal levels of other serum enzymes 07/17/2015   Arthritis    Constipation 07/11/2015   COVID-19 03/06/2021   Diabetes mellitus without complication ([place])    Elevated liver enzymes    Fatty liver    Generalized abdominal pain 07/11/2015   Hyperlipidemia    Hypertension    Lifelong obesity    Macular degeneration    Morbid obesity due to excess calories ([place]) 07/11/2015   Other fatigue 07/11/2015   Sleep apnea    Spinal headache    Past Surgical History:  Procedure Laterality Date   ABDOMINAL HYSTERECTOMY     APPENDECTOMY     CATARACT EXTRACTION     CERVICAL LAMINECTOMY  07/21/1985   CESAREAN SECTION     COLONOSCOPY  07/21/2012   diverticulosis, [facility] Dr. Donnella Sham    COLONOSCOPY WITH PROPOFOL N/A 02/04/2019   Procedure: COLONOSCOPY WITH PROPOFOL;  Surgeon: Lollie Sails, MD;  Location: [facility] ENDOSCOPY;  Service: Endoscopy;  Laterality: N/A;   COLONOSCOPY WITH PROPOFOL N/A 03/18/2021   Procedure: COLONOSCOPY WITH PROPOFOL;  Surgeon: Lesly Rubenstein, MD;  Location: [facility] ENDOSCOPY;  Service: Endoscopy;  Laterality: N/A;  DM   DIAGNOSTIC LAPAROSCOPY     ESOPHAGOGASTRODUODENOSCOPY (EGD) WITH PROPOFOL N/A 02/04/2019   Procedure: ESOPHAGOGASTRODUODENOSCOPY (EGD) WITH PROPOFOL;  Surgeon: Lollie Sails, MD;  Location: [facility] ENDOSCOPY;  Service: Endoscopy;  Laterality: N/A;   ESOPHAGOGASTRODUODENOSCOPY (EGD) WITH PROPOFOL N/A 03/18/2021   Procedure: ESOPHAGOGASTRODUODENOSCOPY (EGD) WITH PROPOFOL;  Surgeon: Lesly Rubenstein, MD;  Location: [facility] ENDOSCOPY;  Service: Endoscopy;  Laterality: N/A;   LOOP RECORDER INSERTION N/A 08/05/2021   Procedure: LOOP RECORDER INSERTION;  Surgeon: Evans Lance, MD;  Location: [place] CV LAB;  Service: Cardiovascular;  Laterality: N/A;   [place]     TUBAL LIGATION     Patient Active Problem List   Diagnosis Date Noted   Cerebral edema ([place]) 08/07/2021   OSA (obstructive sleep apnea) 08/07/2021   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/07/2021   CVA (cerebral vascular accident) ([place]) 08/01/2021   GERD (gastroesophageal reflux disease) 07/20/2019   Advanced care planning/counseling discussion 12/17/2016   Skin lesions, generalized 11/13/2016   Chronic right hip pain 06/17/2016   Vitamin D deficiency 09/26/2015   Diastasis recti 08/08/2015   Elevated serum GGT level 07/24/2015   Essential hypertension 07/17/2015   Fatty liver 07/17/2015   Elevated alkaline phosphatase level 07/17/2015   Elevated serum glutamic pyruvic transaminase (SGPT) level 07/17/2015   Abnormal finding on EKG 07/11/2015   Morbid obesity ([place]) 07/11/2015   Colon, diverticulosis 07/11/2015   Abdominal wall hernia 07/11/2015  DM (diabetes mellitus), type 2 with neurological complications ([place])    Hyperlipidemia     ONSET DATE: 08/01/2021  REFERRING DIAG: CVA  THERAPY DIAG:  Muscle weakness (generalized)  Other lack of coordination  Rationale for Evaluation and Treatment Rehabilitation  SUBJECTIVE:   SUBJECTIVE STATEMENT:   Pt. reports  doing well today   Pt accompanied by: self  PERTINENT HISTORY:  Pt. is a 76 y.o. female who presents with a diagnosis of RMCA CVA infarction with hemorrhagic transformation. Pt. attended inpatient rehab from  08/07/2021-08/22/2021. Pt. received Home health therapy services this past spring.  Pt. has recently had an assessment through driver rehabilitation services at [facility] with recommendations for referrals for  outpatient OT/PT, and ST services. Pt. PMHx includes: HTN, Hyperlipidemia, Macular degeneration, DM, and Obesity.  PRECAUTIONS: None  WEIGHT BEARING RESTRICTIONS No  PAIN:  Are you having pain? No.    FALLS: Has patient fallen in last 6 months? Yes.  LIVING ENVIRONMENT: Lives with: lives with their family  Son  Alyssa Mayo Lives in: House/apartment Main living are on one floor Stairs:  2 steps to enter Has following equipment at home: Single point cane, Wheelchair (manual), shower chair, Grab bars, bed rail, and rubber mat.  PLOF: Independent  PATIENT GOALS: To be able to Drive, and cook again  OBJECTIVE:   HAND DOMINANCE: Right   ADLs: Overall ADLs:  Transfers/ambulation related to ADLs: Independent Eating: Independent Grooming: Pt. Fatigues with sustained BUE's in elevation for haircare. Especially with blow drying hair. UB Dressing: Independent pullover shirt, independent now with fastening a bra LB Dressing: Independent donning pants socks, and slide on shoes Toileting: Independent Bathing: Independent Tub Shower transfers: Walk-in shower Independent now   IADLs: Shopping: Needs to be accompanied to the grocery store. Light housekeeping: Does own laundry, laundry,  Meal Prep:  Difficulty with cooking Community mobility: Relies on son, and friend Medication management: Son sets up Building surveyor management: Manages monthly bills independently. Handwriting: No change from baseline  MOBILITY STATUS: Hx of falls out of bed   ACTIVITY TOLERANCE: Activity tolerance:  10-20 min. Before rest break  FUNCTIONAL OUTCOME MEASURES: FOTO: 57  TR score: 62  UPPER EXTREMITY ROM     Active ROM Right eval Left eval Left 05/06/2022 Left 05/27/2022 Left 06/19/2022   Shoulder  flexion WFL 96(110) 107(110) 109(120) 112(126)  Shoulder abduction WFL 82(105) 86(105) 92(115) 94(115)  Shoulder adduction       Shoulder extension       Shoulder internal rotation       Shoulder external rotation       Elbow flexion [facility] WFL  Elbow extension [facility] WFL  Wrist flexion WFL 44(60) 68(68) WFL WFL  Wrist extension WFL 32(60) 66(66) WFL WFL  Wrist ulnar deviation       Wrist radial deviation       Wrist pronation       Wrist supination       (Blank rows = not tested)   UPPER EXTREMITY MMT:     MMT Right eval Left eval Left 05/06/2022 Left 05/27/2022 Left 06/19/2022  Shoulder flexion 4+/5 3-/5 3+/5 3+/5 3+/5  Shoulder abduction 4+/5 3-/5 3-/5 3+/5 3+/5  Shoulder adduction       Shoulder extension       Shoulder internal rotation       Shoulder external rotation       Middle trapezius       Lower trapezius  Elbow flexion 4+/5 4-/5 4+/5 5/5 5/5  Elbow extension 4+/5 4-/5 4+/5 5/5 5/5  Wrist flexion 4+/5 3-/5 4/5 4+/5 4+/5  Wrist extension 4+/5 3/5 4/5 4+/5 4+/5  Wrist ulnar deviation       Wrist radial deviation       Wrist pronation       Wrist supination       (Blank rows = not tested)  HAND FUNCTION: Grip strength: Right: 30 lbs; Left: 13 lbs, Lateral pinch: Right: 15 lbs, Left: 8 lbs, and 3 point pinch: Right: 13 lbs, Left: 7 lbs  05/06/2022 Grip strength: Right: 30 lbs; Left: 14 lbs, Lateral pinch: Right: 15 lbs, Left: 10 lbs, and 3 point pinch: Right: 13 lbs, Left: 7 lbs  05/27/2022:  Grip strength: Right: 30 lbs; Left: 17 lbs, Lateral pinch: Right: 15 lbs, Left: 11 lbs, and 3 point pinch: Right: 13 lbs, Left: 7 lbs  06/19/2022:  Grip strength: Right: 30 lbs; Left: 18 lbs, Lateral pinch: Right: 15 lbs, Left:  10 lbs, and 3 point pinch: Right: 13 lbs, Left: 8 lbs   COORDINATION:  9 Hole Peg test: Right: 26  sec; Left: 1 min. & 9 sec  05/06/2022 9 Hole Peg test: Right: 26  sec; Left: 1 min.  05/27/2022  9 Hole Peg  test: Right: 26  sec; Left: 1 min. 4 sec.   06/19/2022  9 Hole Peg test: Right: 26  sec; Left: 1 min.   SENSATION: Light touch: WFL Proprioception: WFL  COGNITION: Overall cognitive status: Within functional limits for tasks assessed  VISION: Subjective report: Glasses. Just received a new prescription to increase bifocal strength Baseline vision: Macular Degeneration Visual history: macular degeneration  VISION ASSESSMENT: To be further assessed in functional context  Left sided Awareness    PERCEPTION: Limited left sided awareness   TODAY'S TREATMENT:     Neuro muscular re-education:  Pt. worked on left hand [facility] skills using the Building services engineer Task. Pt. worked on sustaining grasp on the resistive tweezers while grasping this sticks, and moving them from a horizontal position to a vertical position to prepare for placing them into the pegboard. Pt. required verbal cues, and cues for visual demonstration for wrist position, and hand pattern when placing them into the pegboard. Pt. worked on grasping 1/8" pegs with resistive tweezers, and placing them onto a small pegboard.   The Pt., and her son were unaware of her OT appointment today, and had conflicting appointment set with her financial advisor. After receiving a call from her financial advisor the Pt. reported having to leave early to meet with him before his next scheduled appointment. Pt. is making progress with left hand function, and [facility] skills. Patient was able to hold the tweezers in her left hand, and grasp them from the flat flat tabletop surface. Pt. presented with difficulty with turning, and changing the position of the pegs to an upright position prior to placing them vertically into the pegboard. Pt. was able to place 7 pegs with the long resistive tweezers. Pt. was able to remove the vertical pegs with manual tweezers. Pt. was able to place the 1/8" pegs onto the small pegboard with both the resistive  tweezers, and the manual tweezers , however requires increased time to complete. Pt. presented with increased difficulty with removing the accuracy of removing  Pt. continues to work on improving left UE function in order to work towards improving LUE engagement in, and maximizing independence with ADLs, and IADL tasks.  PATIENT EDUCATION: Education details: Kitchen safety/use of external memory aids to turn off burners, limit distractions when in kitchen Person educated: Patient Education method: verbal cues Education comprehension: verbalized understanding and pt does not plan to cook unless son is present.   HOME EXERCISE PROGRAM:  Continue with an ongoing assessment of HEP needs    GOALS: Goals reviewed with patient? Yes   SHORT TERM GOALS: Target date:  04/15/2022      Pt. Will improve FOTO score by 2 points to reflect improved pt. perceived functional performance Baseline: FOTO: 57, TR score: 62 10th visit: FOTO: 65, 20th visit: FOTO 62, TR score: 62 Goal status: Achieved  LONG TERM GOALS: Target date: 08/19/2022    Pt. Will improve left UE strength by 2 mm grades to assist with repositioning her grandson on her lap. Baseline: Eval: left shoulder flexion: 3-/5, abduction 3-/5,  elbow flex/ext. 4-/5, wrist extension 3/5, wrist flexion 3-/5  10th visit: left shoulder flexion: 3+/5, abduction 3-+5,  elbow flex/ext. 4+/5, wrist extension 4/5, wrist flexion 4/5 05/27/2022: left shoulder flexion: 3+/5, abduction 3+/5,  elbow flex/ext. 5/5, wrist extension 4+/5, wrist flexion 4+/5  20th visit: left shoulder flexion: 3+/5, abduction 3+/5,  elbow flex/ext. 5/5, wrist extension 4+/5, wrist flexion 4+/5. Pt. Can now hold her grandson grandson on her lap, and reposition him. Pt. Is unable to hold him in standing. Goal status: Ongoing  2.  Pt. will improve left grip strength by 5# to be able to independently hold and use a blow dryer, and brush for hair care. Baseline: Eval: Left  grip strength 13#. Pt. Has difficulty performing hair care, holding and using a brush, and blow dryer. 10th visit: Left grip 14#. Pt. Is now able to hold th e blow dryer for the sides, and top of head, not the back. 11/07: Left 17# Pt. Has difficulty blow drying then back of her head.20th visit: Left grip 18#. Pt. Is able to independently hold a brush, and blow dry her hair.  Goal status: Achieved  3.  Pt. Will improve left lateral pinch strength by 3# to be able to independently cut meat. Baseline: Eval: Left 8, Pt. Has difficulty cutting meat. 10th: Left: 10#.  Pt. Is able to cut tender meat, however is not as efficient 11/07: Left 11#. Pt. Is independent cutting meat with increased time. 20th visit: Left: 10#. Pt. Has difficulty positioning, and stabilizing the utensils while cutting meat. Goal status:  Achieved  4.  Pt. Will improve Left hand [facility] skills to be able to be able to independently, and efficiently manipulate small objects during ADL tasks. Baseline: Eval: left: 1 min. 9 sec. 10th visit: Left: 1 min. Pt. Is improving with manipulating small objects. 11/07: Left: 1 min. & 4 sec. Pt. Has difficulty manipulating small objects. 20th visit: Left:  1 min. Pt. Is improving with grasping pills, however continues to have difficulty. Goal status: Ongoing   5.  Pt. Will perform light meal preparation with modified independence Baseline: Eval: Pt. Reports having difficulty performing light  meal preparation, and cooking tasks. 10th visit: Pt. Is able to make toast in the morning, a sandwich,  use the microwave, make coffee, and pour herself a drink. 11/07: Pt. Has difficulty holding a coffee pot when filling the reservoir 20th visit: Pt. Is making waffles in the toaster, heats items in in the microwave, Pt. Uses the oven when supervised.  Goal status: Ongoing  CLINICAL IMPRESSION:  The Pt., and her son were unaware of her OT  appointment today, and had conflicting appointment set with her financial  advisor. After receiving a call from her financial advisor the Pt. reported having to leave early to meet with him before his next scheduled appointment. Pt. is making progress with left hand function, and [facility] skills. Patient was able to hold the tweezers in her left hand, and grasp them from the flat flat tabletop surface. Pt. presented with difficulty with turning, and changing the position of the pegs to an upright position prior to placing them vertically into the pegboard. Pt. was able to place 7 pegs with the long resistive tweezers. Pt. was able to remove the vertical pegs with manual tweezers. Pt. was able to place the 1/8" pegs onto the small pegboard with both the resistive tweezers, and the manual tweezers , however requires increased time to complete. Pt. presented with increased difficulty with removing the accuracy of removing  Pt. continues to work on improving left UE function in order to work towards improving LUE engagement in, and maximizing independence with ADLs, and IADL tasks.        PERFORMANCE DEFICITS in functional skills including ADLs, IADLs, coordination, dexterity, sensation, ROM, strength, FMC, decreased knowledge of use of DME, vision, and UE functional use, cognitive skills including attention, and psychosocial skills including coping strategies, environmental adaptation, habits, and routines and behaviors.   IMPAIRMENTS are limiting patient from ADLs, IADLs, and social participation.   COMORBIDITIES may have co-morbidities  that affects occupational performance. Patient will benefit from skilled OT to address above impairments and improve overall function.  MODIFICATION OR ASSISTANCE TO COMPLETE EVALUATION: Min-Moderate modification of tasks or assist with assess necessary to complete an evaluation.  OT OCCUPATIONAL PROFILE AND HISTORY: Detailed assessment: Review of records and additional review of physical, cognitive, psychosocial history related to current functional  performance.  CLINICAL DECISION MAKING: Moderate - several treatment options, min-mod task modification necessary  REHAB POTENTIAL: Good  EVALUATION COMPLEXITY: Moderate    PLAN: OT FREQUENCY: 2x/week  OT DURATION: 12 weeks  PLANNED INTERVENTIONS: self care/ADL training, therapeutic exercise, therapeutic activity, neuromuscular re-education, manual therapy, passive range of motion, and paraffin  RECOMMENDED OTHER SERVICES: ST, and PT  CONSULTED AND AGREED WITH PLAN OF CARE: Patient  PLAN FOR NEXT SESSION:  L hand strengthening and coordination exercises  Harrel Carina, MS, OTR/L

## 2022-07-01 NOTE — Therapy (Addendum)
OUTPATIENT SPEECH LANGUAGE PATHOLOGY TREATMENT NOTE       Patient Name: Alyssa Mayo MRN: [id number] DOB:July 31, 1945, 76 y.o., female Today's Date: 07/01/2022  PCP: [place] Pink, MD REFERRING PROVIDER: Frann Rider, NP    End of Session - 07/01/22 1553     Visit Number 21    Number of Visits 27    Date for SLP Re-Evaluation 08/21/22    Authorization Type [employer] Medicare HMO/PPO    Progress Note Due on Visit 30    SLP Start Time 1000    SLP Stop Time  1100    SLP Time Calculation (min) 60 min    Activity Tolerance Patient tolerated treatment well                  Past Medical History:  Diagnosis Date   Abnormal levels of other serum enzymes 07/17/2015   Arthritis    Constipation 07/11/2015   COVID-19 03/06/2021   Diabetes mellitus without complication ([place])    Elevated liver enzymes    Fatty liver    Generalized abdominal pain 07/11/2015   Hyperlipidemia    Hypertension    Lifelong obesity    Macular degeneration    Morbid obesity due to excess calories ([place]) 07/11/2015   Other fatigue 07/11/2015   Sleep apnea    Spinal headache    Past Surgical History:  Procedure Laterality Date   ABDOMINAL HYSTERECTOMY     APPENDECTOMY     CATARACT EXTRACTION     CERVICAL LAMINECTOMY  07/21/1985   CESAREAN SECTION     COLONOSCOPY  07/21/2012   diverticulosis, [facility] Dr. Donnella Sham    COLONOSCOPY WITH PROPOFOL N/A 02/04/2019   Procedure: COLONOSCOPY WITH PROPOFOL;  Surgeon: Lollie Sails, MD;  Location: [facility] ENDOSCOPY;  Service: Endoscopy;  Laterality: N/A;   COLONOSCOPY WITH PROPOFOL N/A 03/18/2021   Procedure: COLONOSCOPY WITH PROPOFOL;  Surgeon: Lesly Rubenstein, MD;  Location: [facility] ENDOSCOPY;  Service: Endoscopy;  Laterality: N/A;  DM   DIAGNOSTIC LAPAROSCOPY     ESOPHAGOGASTRODUODENOSCOPY (EGD) WITH PROPOFOL N/A 02/04/2019   Procedure: ESOPHAGOGASTRODUODENOSCOPY (EGD) WITH PROPOFOL;  Surgeon: Lollie Sails, MD;  Location: [facility] ENDOSCOPY;   Service: Endoscopy;  Laterality: N/A;   ESOPHAGOGASTRODUODENOSCOPY (EGD) WITH PROPOFOL N/A 03/18/2021   Procedure: ESOPHAGOGASTRODUODENOSCOPY (EGD) WITH PROPOFOL;  Surgeon: Lesly Rubenstein, MD;  Location: [facility] ENDOSCOPY;  Service: Endoscopy;  Laterality: N/A;   LOOP RECORDER INSERTION N/A 08/05/2021   Procedure: LOOP RECORDER INSERTION;  Surgeon: Evans Lance, MD;  Location: [place] CV LAB;  Service: Cardiovascular;  Laterality: N/A;   SPINE SURGERY     TUBAL LIGATION     Patient Active Problem List   Diagnosis Date Noted   Cerebral edema ([place]) 08/07/2021   OSA (obstructive sleep apnea) 08/07/2021   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   CVA (cerebral vascular accident) ([place]) 08/01/2021   GERD (gastroesophageal reflux disease) 07/20/2019   Advanced care planning/counseling discussion 12/17/2016   Skin lesions, generalized 11/13/2016   Chronic right hip pain 06/17/2016   Vitamin D deficiency 09/26/2015   Diastasis recti 08/08/2015   Elevated serum GGT level 07/24/2015   Essential hypertension 07/17/2015   Fatty liver 07/17/2015   Elevated alkaline phosphatase level 07/17/2015   Elevated serum glutamic pyruvic transaminase (SGPT) level 07/17/2015   Abnormal finding on EKG 07/11/2015   Morbid obesity ([place]) 07/11/2015   Colon, diverticulosis 07/11/2015   Abdominal wall hernia 07/11/2015   DM (diabetes mellitus), type 2 with neurological complications ([place])  Hyperlipidemia     ONSET DATE: 08/01/2021 CVA; 02/26/2022 date of referral   REFERRING DIAG: I69.319 (ICD-10-CM) - Cognitive deficit, post-stroke I63.511 (ICD-10-CM) - Right middle cerebral artery stroke [facility])     PERTINENT HISTORY: [name] is a 76 y.o. female with history of diabetes, hypertension, hyperlipidemia, obesity, and sleep apnea who presented to [facility] ED on 08/01/2021 after being found down by her son. Her last know well was approximately 07/29/2021. Pt transferred to [facility] for  further CVA management. In addition, pt received ST services in CIR from 08/08/2021 thru 08/21/2021.      DIAGNOSTIC FINDINGS: MRI showed extensive restricted diffusion throughout much of the right MCA territory with associated changes of hemorrhagic transformation and localized cerebral edema and mass effect. Per Dr. Leonie Man, felt embolic pattern secondary to unclear source although concerning for occult A fib. Repeat [facility] 1/14 showed no interval progression of right MCA hemorrhagic infarct with petechial hemorrhage.  CTA head/neck showed right M2 thrombus.  EF 60 to 65%.   THERAPY DIAG:  Cognitive communication deficit  Right middle cerebral artery stroke [facility])  Rationale for Evaluation and Treatment Rehabilitation  SUBJECTIVE: pt brought in word scramble that she received at her ladies' meeting for church, expressed SLP might be interested in the words listed "I thought about you"  Pt accompanied by: self  PAIN:  Are you having pain? No  PATIENT GOALS to be able to drive again and live independently (she currently lives alone but her son plans on moving in with her)  OBJECTIVE:   TODAY'S TREATMENT: Skilled treatment session focused on pt's cognitive communication goals. SLP facilitated the session by providing the following interventions:  Pt's son attended session with pt. They report that pt independently paid 2 bilss but selected the payment date to be in January vs current month (December). Educaiton provided on previous SLP recommendation of allowing son to pay bills. SLP promoted increased conversation about bill paying system that allows son to pay all bills but that also allows pt to feel sense of involvement. Specific system was created with written information provided to pt and her son.   In addition, SLP provided skilled instruciton in using written visual cues to help remind pt to complete tasks. For example, post-it note by stove instructing pt to turn of stove/oven and then  take pot or dish out. Another suggestion included putting a post-it note in bathroom reminding her to clean up her trash.   While pt is eager to put the above strategies into action, suspect some of the above examples were baseline behaviors but are now more noticable as her son lives with her vs acute impairment based on CVA.      PATIENT EDUCATION: Education details: external memory aids Person educated: Patient and Child(ren) Education method: Explanation, Media planner, Verbal cues, and Handouts Education comprehension: needs further education  HOME EXERCISE PROGRAM  Updating email - deleting and un-scribing emails  GOALS: Goals reviewed with patient? Yes  SHORT TERM GOALS: Target date: 10 sessions  UPDATED: 04/21/2022  UPDATED: 06/24/2022 With moderate assistance, pt will recall her medicines with 80% accuracy.  Baseline: currently unable 04/21/2022 Goal status: MET; 91% recall independently   2.  With minimal assistance, pt will complete basic medication management task with > 90% accuracy.  Baseline: total 04/21/2022 Goal status: ONGOING; currently requiring moderate to minimal assistance  06/24/2022 Goal Status: MET Updated Goal: with supervision assistance, pt will complete basic medication management task with > 90% accuracy.  3.  Pt will demonstrate selective attention to basic task for 15 minutes with < 2 redirections.  Baseline: moderate assistance 04/21/2022 Goal status: ONGOING; currently requiring minimal to supervision assistance 06/24/2022 Goal Status: NOT MET Updated Goal: Pt will demonstrate selective attention to basic task for 10 minutes with < 5 redirections.    4.  Pt will complete basic bill paying task with 75% accuracy.  Baseline: total 04/21/2022 Goal status: ONGOING; currently at 50% with minimal assistance 06/24/2022 Goal Status: NOT MET Updated Goal: Pt will use external visual to assess which bills have been paid by her son.   5. With  minimal assistance, pt will use strategies to complete semi-complex visuospatial tasks with 85% accuracy.   Baseline: moderate  Goal status: INITIAL  04/21/2022  Goal Status: ONGOING  06/21/2022  Goal Status: MET  6. Pt will require < 2 cues to initiate leaving session in 5 out of 6 opportunities.   Baseline: 6 cues  Goal status: INITIAL 04/21/2022 Goal Status: ONGOING 06/21/2022 Goal Status: ONGOING     LONG TERM GOALS: Target date: 08/21/2021 With rare min A, pt will organize her medicines accurately in a pill organizer.  Baseline: Total Goal status: ONGOING   2.  With rare min A, pt will demonstrate selective attention to task for 30 minutes.  Baseline: moderate Goal status: ONGOING   3.  With rare min A, pt will sequence basic meal prep activity.  Baseline: Moderate Goal status: ONGOING      ASSESSMENT:  CLINICAL IMPRESSION: While pt has made some progress over the last 10 sessions, her progress is slower than expected d/t baseline disorganization, difficulty navigating loss of independence, adjusting to her son moving in with her.   OBJECTIVE IMPAIRMENTS include attention and executive functioning. These impairments are limiting patient from managing medications, managing appointments, managing finances, household responsibilities, and ADLs/IADLs. Factors affecting potential to achieve goals and functional outcome are ability to learn/carryover information. Patient will benefit from skilled SLP services to address above impairments and improve overall function.  REHAB POTENTIAL: Good  PLAN: SLP FREQUENCY: 1-2x/week  SLP DURATION: 12 weeks  PLANNED INTERVENTIONS: Functional tasks, SLP instruction and feedback, and Patient/family education   Nautika Cressey B. Rutherford Nail, M.S., CCC-SLP, Mining engineer Certified Brain Injury [place]  [place] Office [phone] Ascom [phone] Fax  [phone]

## 2022-07-03 ENCOUNTER — Ambulatory Visit: Payer: Medicare HMO | Admitting: Occupational Therapy

## 2022-07-03 ENCOUNTER — Ambulatory Visit: Payer: Medicare HMO

## 2022-07-03 ENCOUNTER — Telehealth: Payer: Self-pay

## 2022-07-03 DIAGNOSIS — I63511 Cerebral infarction due to unspecified occlusion or stenosis of right middle cerebral artery: Secondary | ICD-10-CM

## 2022-07-03 DIAGNOSIS — R41841 Cognitive communication deficit: Secondary | ICD-10-CM | POA: Diagnosis not present

## 2022-07-03 DIAGNOSIS — R2681 Unsteadiness on feet: Secondary | ICD-10-CM

## 2022-07-03 DIAGNOSIS — M6281 Muscle weakness (generalized): Secondary | ICD-10-CM

## 2022-07-03 DIAGNOSIS — R278 Other lack of coordination: Secondary | ICD-10-CM

## 2022-07-03 DIAGNOSIS — R262 Difficulty in walking, not elsewhere classified: Secondary | ICD-10-CM

## 2022-07-03 NOTE — Therapy (Signed)
OUTPATIENT PHYSICAL THERAPY NEURO TREATMENT    Patient Name: Alyssa Mayo MRN: [id number] DOB:Jul 24, 1945, 76 y.o., female Today's Date: 07/03/2022   PCP: [place] Pink, MD REFERRING PROVIDER: Frann Rider, NP   PT End of Session - 07/03/22 1105     Visit Number 21    Number of Visits 25    Date for PT Re-Evaluation 09/16/22    Authorization Type A[employer]na Medicare    Authorization Time Period 04/02/2022-06/25/2022; 06/24/2022-09/16/2022    Progress Note Due on Visit 20    PT Start Time 1101    PT Stop Time 1145    PT Time Calculation (min) 44 min    Equipment Utilized During Treatment Gait belt    Activity Tolerance Patient tolerated treatment well    Behavior During Therapy [facility] for tasks assessed/performed              Past Medical History:  Diagnosis Date   Abnormal levels of other serum enzymes 07/17/2015   Arthritis    Constipation 07/11/2015   COVID-19 03/06/2021   Diabetes mellitus without complication ([place])    Elevated liver enzymes    Fatty liver    Generalized abdominal pain 07/11/2015   Hyperlipidemia    Hypertension    Lifelong obesity    Macular degeneration    Morbid obesity due to excess calories ([place]) 07/11/2015   Other fatigue 07/11/2015   Sleep apnea    Spinal headache    Past Surgical History:  Procedure Laterality Date   ABDOMINAL HYSTERECTOMY     APPENDECTOMY     CATARACT EXTRACTION     CERVICAL LAMINECTOMY  07/21/1985   CESAREAN SECTION     COLONOSCOPY  07/21/2012   diverticulosis, [facility] Dr. Donnella Sham    COLONOSCOPY WITH PROPOFOL N/A 02/04/2019   Procedure: COLONOSCOPY WITH PROPOFOL;  Surgeon: Lollie Sails, MD;  Location: [facility] ENDOSCOPY;  Service: Endoscopy;  Laterality: N/A;   COLONOSCOPY WITH PROPOFOL N/A 03/18/2021   Procedure: COLONOSCOPY WITH PROPOFOL;  Surgeon: Lesly Rubenstein, MD;  Location: [facility] ENDOSCOPY;  Service: Endoscopy;  Laterality: N/A;  DM   DIAGNOSTIC LAPAROSCOPY     ESOPHAGOGASTRODUODENOSCOPY (EGD) WITH  PROPOFOL N/A 02/04/2019   Procedure: ESOPHAGOGASTRODUODENOSCOPY (EGD) WITH PROPOFOL;  Surgeon: Lollie Sails, MD;  Location[facility] ENDOSCOPY;  Service: Endoscopy;  Laterality: N/A;   ESOPHAGOGASTRODUODENOSCOPY (EGD) WITH PROPOFOL N/A 03/18/2021   Procedure: ESOPHAGOGASTRODUODENOSCOPY (EGD) WITH PROPOFOL;  Surgeon: Lesly Rubenstein, MD;  Location: [facility] ENDOSCOPY;  Service: Endoscopy;  Laterality: N/A;   LOOP RECORDER INSERTION N/A 08/05/2021   Procedure: LOOP RECORDER INSERTION;  Surgeon: Evans Lance, MD;  Location: [place] CV LAB;  Service: Cardiovascular;  Laterality: N/A;   [place]     TUBAL LIGATION     Patient Active Problem List   Diagnosis Date Noted   Cerebral edema ([place]) 08/07/2021   OSA (obstructive sleep apnea) 08/07/2021   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/07/2021   CVA (cerebral vascular accident) ([place]) 08/01/2021   GERD (gastroesophageal reflux disease) 07/20/2019   Advanced care planning/counseling discussion 12/17/2016   Skin lesions, generalized 11/13/2016   Chronic right hip pain 06/17/2016   Vitamin D deficiency 09/26/2015   Diastasis recti 08/08/2015   Elevated serum GGT level 07/24/2015   Essential hypertension 07/17/2015   Fatty liver 07/17/2015   Elevated alkaline phosphatase level 07/17/2015   Elevated serum glutamic pyruvic transaminase (SGPT) level 07/17/2015   Abnormal finding on EKG 07/11/2015   Morbid obesity ([place]) 07/11/2015   Colon, diverticulosis 07/11/2015  Abdominal wall hernia 07/11/2015   DM (diabetes mellitus), type 2 with neurological complications [facility])    Hyperlipidemia     ONSET DATE: January 11th 2023  REFERRING DIAG:  I69.398,R26.9 (ICD-10-CM) - Gait disturbance, post-stroke  I63.511 (ICD-10-CM) - Right middle cerebral artery stroke ([facility])    THERAPY DIAG:  Muscle weakness (generalized)  Cognitive communication deficit  Right middle cerebral artery stroke ([place])  Other lack of  coordination  Difficulty in walking, not elsewhere classified  Unsteadiness on feet  Rationale for Evaluation and Treatment Rehabilitation  SUBJECTIVE:  Pt reports that she is expecting a call back from her cardiologist.  They have been monitoring the pt's heart and called with concern over her HR this morning.  Pt notes that she felt her heart racing last night significantly more than any other time, with the feeling as if her heart was beating in her back.  Pt notes she thought that it felt like maybe there was an earthquake, but it was just her heart beating.   Pt accompanied by: self  PERTINENT HISTORY: Per chart and confirmed by pt:  Pt is a 76 yo female with RMCA CVA infarction with hemorrhagic transformation 08/01/2021, with inpatient rehab 08/07/2021-08/22/2021 followed by [facility] therapy until June. Pt now currently being seen for outpatient OT and ST services. Pt reports limitations in stamina persist. Other PMH is significant for HTN, HLD, macular degeneration, DM, obesity, sleep apnea, arthritis, chronic R hip pain, vitamin D deficiency, diastasis recti, abdominal wall hernia, hx of abdominal hysterectomy, appendectomy, spine surgery (years ago).   PAIN:  Are you having pain? No Pain Location:    TODAY'S TREATMENT:  BP taken upon entering into the clinic due to concern from cardiologist BP: 144/80 HR: 108  Seated after resting 5 min: BP: 114/84 HR: 108   Neuro:  Activity Description: Pt participated in obstacle course including pod placed on 12" step, 360 turns, walking around pod, standing on airex pad before tap Activity Setting:  Home [place] Number of Pods:  6 Cycles/Sets:  3 Duration (Time or Hit Count):  1.5 min  Patient Stats  Reaction Time:  7206 avg OR Hits:   8  HR: 117 following first bout HR: 125 following second bout       PATIENT EDUCATION: Education details: exam findings, indications, plan, HEP Person educated: Patient Education method:  Consulting civil engineer, Demonstration, Verbal cues, and Handouts Education comprehension: verbalized understanding, returned demonstration, verbal cues required, and needs further education   HOME EXERCISE PROGRAM:  Access Code: [email or web address] URL: [email or web address] Date: 06/02/2022 Prepared by: [name]  Exercises - Standing Single Leg Stance with Counter Support  - 2 x daily - 7 x weekly - 1 sets - 10 reps - 10s  hold - Standing Marching  - 2 x daily - 7 x weekly - 2 sets - 20 reps - Side Stepping with Counter Support  - 2 x daily - 7 x weekly - 2 sets - 20 reps - Walking  - 2 x daily - 7 x weekly    GOALS: Goals reviewed with patient? Yes  SHORT TERM GOALS: Target date: 08/14/2022  Patient will be independent in home exercise program to improve strength/mobility for better functional independence with ADLs. Baseline: initiated; Patient reports walking but not always independent with progressive HEP. Will keep goal active to anticipate more balance ex for HEP.  Goal status: PROGRESSING   LONG TERM GOALS: Target date: 09/25/2022  Patient will increase FOTO score to equal to or greater than  65  to demonstrate improvement in mobility and quality of life.  Baseline: 57 06/24/2022= 61 Goal status: IN PROGRESS  2.  Patient (> 66 years old) will complete five times sit to stand test in < 15 seconds indicating an increased LE strength and improved balance. Baseline: 17 sec hands-free 05/21/2022 - 15.36 sec 06/24/2022 - 13.56 sec  Goal status: GOAL MET  3.  Patient will increase 10 meter walk test to >1.51ms as to improve gait speed for better community ambulation and to reduce fall risk. Baseline: 0.66 m/s; 11/1:  0.85 m/s 06/24/2022= 0.97 m/s without and AD Goal status: IN PROGRESS  4.  Patient will increase Berg Balance score by > 3 points to demonstrate decreased fall risk during functional activities. Baseline: 50/56 06/24/2022= 52/56 Goal status: IN PROGRESS  5.   Patient will increase six minute walk test distance to >1000 for progression to community ambulator and improve gait ability Baseline: 04/10/22: 678f(pain onset at 5 minutes) 05/21/22: 1027 ft 06/23/2022= 940 feet - will keep goal active until patient able to consistently achieve > 1000 feet Goal status: IN PROGRESS    ASSESSMENT:  CLINICAL IMPRESSION:  Pt is making significant progress and was able to tolerate balance related tasks utilizing the blazepods.  Pt able to progressively get faster with all subsequent tasks related and performed without any LOB.   Pt will continue to benefit from skilled therapy to address remaining deficits in order to improve overall QoL and return to PLOF.      The patient demonstrated significant progress while utilizing BlRadioShackshowcasing improved coordination, balance, and cognitive function. The incorporation of dual-tasking technology with color recognition and association with specific movements in Blaze Pods was strategically chosen to provide a dynamic training environment, enabling the patient to engage in simultaneous physical and cognitive tasks. This unique approach enhances not only their physical abilities but also fosters increased neural connectivity and mental awareness, contributing to a well-rounded and effective rehabilitation and training experience.         OBJECTIVE IMPAIRMENTS Abnormal gait, decreased activity tolerance, decreased balance, decreased coordination, decreased endurance, decreased mobility, difficulty walking, decreased strength, impaired sensation, impaired UE functional use, improper body mechanics, postural dysfunction, and pain.   ACTIVITY LIMITATIONS carrying, lifting, bending, standing, squatting, stairs, bed mobility, bathing, reach over head, hygiene/grooming, and locomotion level  PARTICIPATION LIMITATIONS: meal prep, cleaning, laundry, medication management, personal finances, driving, shopping, community  activity, and yard work  PERSONAL FACTORS Age, Sex, Time since onset of injury/illness/exacerbation, and 3+ comorbidities: Other PMH is significant for HTN, HLD, macular degeneration, DM, obesity, sleep apnea, arthritis, chronic R hip pain, vitamin D deficiency, diastasis recti, abdominal wall hernia, hx of abdominal hysterectomy, appendectomy, spine surgery (years ago).   are also affecting patient's functional outcome.   REHAB POTENTIAL: Good  CLINICAL DECISION MAKING: Evolving/moderate complexity  EVALUATION COMPLEXITY: Moderate  PLAN: PT FREQUENCY: 2x/week  PT DURATION: 12 weeks  PLANNED INTERVENTIONS: Therapeutic exercises, Therapeutic activity, Neuromuscular re-education, Balance training, Gait training, Patient/Family education, Self Care, Joint mobilization, Joint manipulation, Stair training, Vestibular training, Canalith repositioning, Orthotic/Fit training, DME instructions, Dry Needling, Electrical stimulation, Wheelchair mobility training, Spinal mobilization, Cryotherapy, Moist heat, Splintting, Taping, Traction, Ultrasound, Biofeedback, Manual therapy, and Re-evaluation  PLAN FOR NEXT SESSION:   LE strengthening: leg press/squats/etc. HEP compliance and introduce more cognitive related tasks.   JoGwenlyn SaranPT, DPT Physical Therapist- [facility]12/14/23, 5:31 PM

## 2022-07-03 NOTE — Telephone Encounter (Signed)
Following alert received from [employer] received for  Two new AF episodes with RVR at 120-160 bpm. AF burden is 0.2% of the time.  Presenting is fast at 140 bpm, sent to triage.  Patient reports this morning she felt like "my couch was moving" but denies any symptoms such as chest pain, palpitations, shortness of breath or dizziness. Will recheck presenting rhythm tomorrow to see if slower (unable to with ILR 2 to send manual).   Dr. Ladona Ridgel, alerts for AF w/ RVR (EGM at bottom), no OAC on file. Could you review and if yes, I have already discussed AF clinic with her If ok I will send over her info?

## 2022-07-03 NOTE — Therapy (Signed)
Occupational Therapy Treatment Note  Patient Name: Alyssa Mayo MRN: [id number] DOB:Nov 28, 1945, 76 y.o., female Today's Date: 03/14/2022  PCP: [name], MD REFERRING PROVIDER: [name], NP    OT End of Session - 07/03/22 1201     Visit Number 24    Number of Visits 48    Date for OT Re-Evaluation 08/19/22    Authorization Type Progress reporting period starting 06/19/2022    OT Start Time 1100    OT Stop Time 1145    OT Time Calculation (min) 45 min    Activity Tolerance Patient tolerated treatment well    Behavior During Therapy [employer] for tasks assessed/performed                           Past Medical History:  Diagnosis Date   Abnormal levels of other serum enzymes 07/17/2015   Arthritis    Constipation 07/11/2015   COVID-19 03/06/2021   Diabetes mellitus without complication (HCC)    Elevated liver enzymes    Fatty liver    Generalized abdominal pain 07/11/2015   Hyperlipidemia    Hypertension    Lifelong obesity    Macular degeneration    Morbid obesity due to excess calories (HCC) 07/11/2015   Other fatigue 07/11/2015   Sleep apnea    Spinal headache    Past Surgical History:  Procedure Laterality Date   ABDOMINAL HYSTERECTOMY     APPENDECTOMY     CATARACT EXTRACTION     CERVICAL LAMINECTOMY  07/21/1985   CESAREAN SECTION     COLONOSCOPY  07/21/2012   diverticulosis, [facility] Dr. [name]    COLONOSCOPY WITH PROPOFOL N/A 02/04/2019   Procedure: COLONOSCOPY WITH PROPOFOL;  Surgeon: [name], MD;  Location: [facility] ENDOSCOPY;  Service: Endoscopy;  Laterality: N/A;   COLONOSCOPY WITH PROPOFOL N/A 03/18/2021   Procedure: COLONOSCOPY WITH PROPOFOL;  Surgeon: [name], MD;  Location: [facility] ENDOSCOPY;  Service: Endoscopy;  Laterality: N/A;  DM   DIAGNOSTIC LAPAROSCOPY     ESOPHAGOGASTRODUODENOSCOPY (EGD) WITH PROPOFOL N/A 02/04/2019   Procedure: ESOPHAGOGASTRODUODENOSCOPY (EGD) WITH PROPOFOL;  Surgeon: [name], MD;  Location: [facility] ENDOSCOPY;  Service: Endoscopy;  Laterality: N/A;   ESOPHAGOGASTRODUODENOSCOPY (EGD) WITH PROPOFOL N/A 03/18/2021   Procedure: ESOPHAGOGASTRODUODENOSCOPY (EGD) WITH PROPOFOL;  Surgeon: [name], MD;  Location: [facility] ENDOSCOPY;  Service: Endoscopy;  Laterality: N/A;   LOOP RECORDER INSERTION N/A 08/05/2021   Procedure: LOOP RECORDER INSERTION;  Surgeon: [name], MD;  Location: [facility] INVASIVE CV LAB;  Service: Cardiovascular;  Laterality: N/A;   SPINE SURGERY     TUBAL LIGATION     Patient Active Problem List   Diagnosis Date Noted   Cerebral edema (HCC) 08/07/2021   OSA (obstructive sleep apnea) 08/07/2021   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   CVA (cerebral vascular accident) (HCC) 08/01/2021   GERD (gastroesophageal reflux disease) 07/20/2019   Advanced care planning/counseling discussion 12/17/2016   Skin lesions, generalized 11/13/2016   Chronic right hip pain 06/17/2016   Vitamin D deficiency 09/26/2015   Diastasis recti 08/08/2015   Elevated serum GGT level 07/24/2015   Essential hypertension 07/17/2015   Fatty liver 07/17/2015   Elevated alkaline phosphatase level 07/17/2015   Elevated serum glutamic pyruvic transaminase (SGPT) level 07/17/2015   Abnormal finding on EKG 07/11/2015   Morbid obesity (HCC) 07/11/2015   Colon, diverticulosis 07/11/2015   Abdominal wall hernia 07/11/2015  DM (diabetes mellitus), type 2 with neurological complications (HCC)    Hyperlipidemia     ONSET DATE: 08/01/2021  REFERRING DIAG: CVA  THERAPY DIAG:  Muscle weakness (generalized)  Other lack of coordination  Rationale for Evaluation and Treatment Rehabilitation  SUBJECTIVE:   SUBJECTIVE STATEMENT:   Pt. reports that it scared her when she got the call about her loop recorder this morning.  Pt accompanied by: self  PERTINENT HISTORY:  Pt. is a 76 y.o. female who presents with a diagnosis of RMCA CVA infarction with  hemorrhagic transformation. Pt. attended inpatient rehab from 08/07/2021-08/22/2021. Pt. received Home health therapy services this past spring.  Pt. has recently had an assessment through driver rehabilitation services at [facility] with recommendations for referrals for  outpatient OT/PT, and ST services. Pt. PMHx includes: HTN, Hyperlipidemia, Macular degeneration, DM, and Obesity.  PRECAUTIONS: None  WEIGHT BEARING RESTRICTIONS No  PAIN:  Are you having pain? No.    FALLS: Has patient fallen in last 6 months? Yes.  LIVING ENVIRONMENT: Lives with: lives with their family  Son  [name] Lives in: House/apartment Main living are on one floor Stairs:  2 steps to enter Has following equipment at home: Single point cane, Wheelchair (manual), shower chair, Grab bars, bed rail, and rubber mat.  PLOF: Independent  PATIENT GOALS: To be able to Drive, and cook again  OBJECTIVE:   HAND DOMINANCE: Right   ADLs: Overall ADLs:  Transfers/ambulation related to ADLs: Independent Eating: Independent Grooming: Pt. Fatigues with sustained BUE's in elevation for haircare. Especially with blow drying hair. UB Dressing: Independent pullover shirt, independent now with fastening a bra LB Dressing: Independent donning pants socks, and slide on shoes Toileting: Independent Bathing: Independent Tub Shower transfers: Walk-in shower Independent now   IADLs: Shopping: Needs to be accompanied to the grocery store. Light housekeeping: Does own laundry, laundry,  Meal Prep:  Difficulty with cooking Community mobility: Relies on son, and friend Medication management: Son sets up Mining engineer management: Manages monthly bills independently. Handwriting: No change from baseline  MOBILITY STATUS: Hx of falls out of bed   ACTIVITY TOLERANCE: Activity tolerance:  10-20 min. Before rest break  FUNCTIONAL OUTCOME MEASURES: FOTO: 57  TR score: 62  UPPER EXTREMITY ROM     Active ROM Right eval Left eval  Left 05/06/2022 Left 05/27/2022 Left 06/19/2022   Shoulder flexion WFL 96(110) 107(110) 109(120) 112(126)  Shoulder abduction WFL 82(105) 86(105) 92(115) 94(115)  Shoulder adduction       Shoulder extension       Shoulder internal rotation       Shoulder external rotation       Elbow flexion [facility] WFL  Elbow extension [facility] WFL  Wrist flexion WFL 44(60) 68(68) WFL WFL  Wrist extension WFL 32(60) 66(66) WFL WFL  Wrist ulnar deviation       Wrist radial deviation       Wrist pronation       Wrist supination       (Blank rows = not tested)   UPPER EXTREMITY MMT:     MMT Right eval Left eval Left 05/06/2022 Left 05/27/2022 Left 06/19/2022  Shoulder flexion 4+/5 3-/5 3+/5 3+/5 3+/5  Shoulder abduction 4+/5 3-/5 3-/5 3+/5 3+/5  Shoulder adduction       Shoulder extension       Shoulder internal rotation       Shoulder external rotation       Middle trapezius  Lower trapezius       Elbow flexion 4+/5 4-/5 4+/5 5/5 5/5  Elbow extension 4+/5 4-/5 4+/5 5/5 5/5  Wrist flexion 4+/5 3-/5 4/5 4+/5 4+/5  Wrist extension 4+/5 3/5 4/5 4+/5 4+/5  Wrist ulnar deviation       Wrist radial deviation       Wrist pronation       Wrist supination       (Blank rows = not tested)  HAND FUNCTION: Grip strength: Right: 30 lbs; Left: 13 lbs, Lateral pinch: Right: 15 lbs, Left: 8 lbs, and 3 point pinch: Right: 13 lbs, Left: 7 lbs  05/06/2022 Grip strength: Right: 30 lbs; Left: 14 lbs, Lateral pinch: Right: 15 lbs, Left: 10 lbs, and 3 point pinch: Right: 13 lbs, Left: 7 lbs  05/27/2022:  Grip strength: Right: 30 lbs; Left: 17 lbs, Lateral pinch: Right: 15 lbs, Left: 11 lbs, and 3 point pinch: Right: 13 lbs, Left: 7 lbs  06/19/2022:  Grip strength: Right: 30 lbs; Left: 18 lbs, Lateral pinch: Right: 15 lbs, Left:  10 lbs, and 3 point pinch: Right: 13 lbs, Left: 8 lbs   COORDINATION:  9 Hole Peg test: Right: 26  sec; Left: 1 min. & 9 sec  05/06/2022 9 Hole Peg  test: Right: 26  sec; Left: 1 min.  05/27/2022  9 Hole Peg test: Right: 26  sec; Left: 1 min. 4 sec.   06/19/2022  9 Hole Peg test: Right: 26  sec; Left: 1 min.   SENSATION: Light touch: WFL Proprioception: WFL  COGNITION: Overall cognitive status: Within functional limits for tasks assessed  VISION: Subjective report: Glasses. Just received a new prescription to increase bifocal strength Baseline vision: Macular Degeneration Visual history: macular degeneration  VISION ASSESSMENT: To be further assessed in functional context  Left sided Awareness    PERCEPTION: Limited left sided awareness   TODAY'S TREATMENT:   Self-care:  Pt. worked on simulated medication management tasks. Pt. worked on following directions, and problem-solving through setting up a pillbox following a list of instructions with  75% accuracy.   Pt. reports that she was contacted this morning prior to coming to therapy about her loop recorder. Pt. reports that she is waiting a return call from her physician's office. Pt.'s BP during today's OT session is 117/65, with HR 102 bpms. Pt. presents with limited LUE strength, and [facility] Skills, and continues, Pt. dropped multiple small simulated pills/beads when grasping and manipulating them while stabilizing the plastic bottles. Pt. was provided with Dycem to stabilize items on the tabletop surface to prevent slippage of bottles, and minimize bouncing of medication on the tabletop surface to the floor at home.  Pt. continues to work on improving, and maximizing overall independence with ADLs, and IADL tasks.        PATIENT EDUCATION: Education details:  Simulated  Person educated: Patient Education method: verbal cues Education comprehension: verbalized understanding and pt does not plan to cook unless son is present.   HOME EXERCISE PROGRAM:  Continue with an ongoing assessment of HEP needs    GOALS: Goals reviewed with patient? Yes   SHORT TERM  GOALS: Target date:  04/15/2022      Pt. Will improve FOTO score by 2 points to reflect improved pt. perceived functional performance Baseline: FOTO: 57, TR score: 62 10th visit: FOTO: 65, 20th visit: FOTO 62, TR score: 62 Goal status: Achieved  LONG TERM GOALS: Target date: 08/19/2022    Pt. Will improve left UE strength  by 2 mm grades to assist with repositioning her grandson on her lap. Baseline: Eval: left shoulder flexion: 3-/5, abduction 3-/5,  elbow flex/ext. 4-/5, wrist extension 3/5, wrist flexion 3-/5  10th visit: left shoulder flexion: 3+/5, abduction 3-+5,  elbow flex/ext. 4+/5, wrist extension 4/5, wrist flexion 4/5 05/27/2022: left shoulder flexion: 3+/5, abduction 3+/5,  elbow flex/ext. 5/5, wrist extension 4+/5, wrist flexion 4+/5  20th visit: left shoulder flexion: 3+/5, abduction 3+/5,  elbow flex/ext. 5/5, wrist extension 4+/5, wrist flexion 4+/5. Pt. Can now hold her grandson grandson on her lap, and reposition him. Pt. Is unable to hold him in standing. Goal status: Ongoing  2.  Pt. will improve left grip strength by 5# to be able to independently hold and use a blow dryer, and brush for hair care. Baseline: Eval: Left grip strength 13#. Pt. Has difficulty performing hair care, holding and using a brush, and blow dryer. 10th visit: Left grip 14#. Pt. Is now able to hold th e blow dryer for the sides, and top of head, not the back. 11/07: Left 17# Pt. Has difficulty blow drying then back of her head.20th visit: Left grip 18#. Pt. Is able to independently hold a brush, and blow dry her hair.  Goal status: Achieved  3.  Pt. Will improve left lateral pinch strength by 3# to be able to independently cut meat. Baseline: Eval: Left 8, Pt. Has difficulty cutting meat. 10th: Left: 10#.  Pt. Is able to cut tender meat, however is not as efficient 11/07: Left 11#. Pt. Is independent cutting meat with increased time. 20th visit: Left: 10#. Pt. Has difficulty positioning, and stabilizing  the utensils while cutting meat. Goal status:  Achieved  4.  Pt. Will improve Left hand [facility] skills to be able to be able to independently, and efficiently manipulate small objects during ADL tasks. Baseline: Eval: left: 1 min. 9 sec. 10th visit: Left: 1 min. Pt. Is improving with manipulating small objects. 11/07: Left: 1 min. & 4 sec. Pt. Has difficulty manipulating small objects. 20th visit: Left:  1 min. Pt. Is improving with grasping pills, however continues to have difficulty. Goal status: Ongoing   5.  Pt. Will perform light meal preparation with modified independence Baseline: Eval: Pt. Reports having difficulty performing light  meal preparation, and cooking tasks. 10th visit: Pt. Is able to make toast in the morning, a sandwich,  use the microwave, make coffee, and pour herself a drink. 11/07: Pt. Has difficulty holding a coffee pot when filling the reservoir 20th visit: Pt. Is making waffles in the toaster, heats items in in the microwave, Pt. Uses the oven when supervised.  Goal status: Ongoing  CLINICAL IMPRESSION:  Pt. reports that she was contacted this morning prior to coming to therapy about her loop recorder. Pt. reports that she is waiting a return call from her physician's office. Pt.'s BP during today's OT session is 117/65, with HR 102 bpms. Pt. presents with limited LUE strength, and [facility] Skills, and continues, Pt. dropped multiple small simulated pills/beads when grasping and manipulating them while stabilizing the plastic bottles. Pt. was provided with Dycem to stabilize items on the tabletop surface to prevent slippage of bottles, and minimize bouncing of medication on the tabletop surface to the floor at home.  Pt. continues to work on improving, and maximizing overall independence with ADLs, and IADL tasks.        PERFORMANCE DEFICITS in functional skills including ADLs, IADLs, coordination, dexterity, sensation, ROM, strength, FMC, decreased knowledge  of use of DME,  vision, and UE functional use, cognitive skills including attention, and psychosocial skills including coping strategies, environmental adaptation, habits, and routines and behaviors.   IMPAIRMENTS are limiting patient from ADLs, IADLs, and social participation.   COMORBIDITIES may have co-morbidities  that affects occupational performance. Patient will benefit from skilled OT to address above impairments and improve overall function.  MODIFICATION OR ASSISTANCE TO COMPLETE EVALUATION: Min-Moderate modification of tasks or assist with assess necessary to complete an evaluation.  OT OCCUPATIONAL PROFILE AND HISTORY: Detailed assessment: Review of records and additional review of physical, cognitive, psychosocial history related to current functional performance.  CLINICAL DECISION MAKING: Moderate - several treatment options, min-mod task modification necessary  REHAB POTENTIAL: Good  EVALUATION COMPLEXITY: Moderate    PLAN: OT FREQUENCY: 2x/week  OT DURATION: 12 weeks  PLANNED INTERVENTIONS: self care/ADL training, therapeutic exercise, therapeutic activity, neuromuscular re-education, manual therapy, passive range of motion, and paraffin  RECOMMENDED OTHER SERVICES: ST, and PT  CONSULTED AND AGREED WITH PLAN OF CARE: Patient  PLAN FOR NEXT SESSION:  L hand strengthening and coordination exercises    Olegario Messier, MS, OTR/L

## 2022-07-04 ENCOUNTER — Encounter: Payer: Medicare HMO | Attending: Physical Medicine and Rehabilitation | Admitting: Physical Medicine and Rehabilitation

## 2022-07-04 VITALS — BP 127/70 | HR 83 | Ht 62.0 in | Wt 194.0 lb

## 2022-07-04 DIAGNOSIS — E1149 Type 2 diabetes mellitus with other diabetic neurological complication: Secondary | ICD-10-CM | POA: Diagnosis present

## 2022-07-04 DIAGNOSIS — I63511 Cerebral infarction due to unspecified occlusion or stenosis of right middle cerebral artery: Secondary | ICD-10-CM

## 2022-07-04 NOTE — Progress Notes (Signed)
Subjective:    Patient ID: Alyssa Mayo, female    DOB: January 07, 1946, 76 y.o.   MRN: [id number]  HPI Alyssa Mayo is a 76 year old woman who presents for f/u of CVA and peripheral neuropathy 1) CVA -she has been able to walk much better -walked from parking lot to here without an assistive device -does not walk every day -she is following with Dr. Kary Kos. -walking pretty well without cane -goes to PT 2-3 times per week   2) HTN -BP is currently 127/70  3) Diabetic peripheral neuropathy -she is interested in [place] -her neuropathy is worst at night, when it can sometimes prevent her from sleeping -she takes gabapentin    Pain Inventory Average Pain 0 Pain Right Now 0 My pain is intermittent, sharp, and aching  LOCATION OF PAIN  headache, back pain  BOWEL Number of stools per week: 3-4   BLADDER Normal and Pads   Mobility use a cane how many minutes can you walk? 10-15 minutes ability to climb steps?  yes do you drive?  no Do you have any goals in this area?  yes - Function retired I need assistance with the following:  dressing, bathing, meal prep, household duties, and shopping Do you have any goals in this area?  yes  Neuro/Psych weakness trouble walking  Prior Studies Any changes since last visit?  no  Physicians involved in your care Any changes since last visit?  no   Family History  Problem Relation Age of Onset   Cancer Mother        breast   Heart disease Mother    Stroke Mother    Diabetes Mother    Colon cancer Mother    Cancer Father        leukemia   Stroke Father    Leukemia Father    Ovarian cancer Sister    Diabetes Brother    Cancer Brother    Stroke Maternal Grandfather    Dementia Paternal Grandmother    COPD Neg Hx    Hypertension Neg Hx    Social History   Socioeconomic History   Marital status: Widowed    Spouse name: Not on file   Number of children: Not on file   Years of education: Not on file    Highest education level: Bachelor's degree (e.g., BA, AB, BS)  Occupational History   Not on file  Tobacco Use   Smoking status: Never   Smokeless tobacco: Never  Vaping Use   Vaping Use: Never used  Substance and Sexual Activity   Alcohol use: Not Currently    Comment: none last 24 months   Drug use: No   Sexual activity: Not on file  Other Topics Concern   Not on file  Social History Narrative   Not on file   Social Determinants of Health   Financial Resource Strain: Low Risk  (12/10/2017)   Overall Financial Resource Strain (CARDIA)    Difficulty of Paying Living Expenses: Not hard at all  Food Insecurity: No Food Insecurity (12/10/2017)   Hunger Vital Sign    Worried About Running Out of Food in the Last Year: Never true    Ran Out of Food in the Last Year: Never true  Transportation Needs: No Transportation Needs (12/10/2017)   PRAPARE - Hydrologist (Medical): No    Lack of Transportation (Non-Medical): No  Physical Activity: Inactive (12/10/2017)   Exercise Vital Sign  Days of Exercise per Week: 0 days    Minutes of Exercise per Session: 0 min  Stress: No Stress Concern Present (12/10/2017)   [employer] of Occupational Health - Occupational Stress Questionnaire    Feeling of Stress : Not at all  Social Connections: Socially Integrated (12/10/2017)   Social Connection and Isolation Panel [NHANES]    Frequency of Communication with Friends and Family: More than three times a week    Frequency of Social Gatherings with Friends and Family: More than three times a week    Attends Religious Services: More than 4 times per year    Active Member of Clubs or Organizations: Yes    Attends Engineer, structural: More than 4 times per year    Marital Status: Married   Past Surgical History:  Procedure Laterality Date   ABDOMINAL HYSTERECTOMY     APPENDECTOMY     CATARACT EXTRACTION     CERVICAL LAMINECTOMY  07/21/1985   CESAREAN  SECTION     COLONOSCOPY  07/21/2012   diverticulosis, [facility] Dr. [name]    COLONOSCOPY WITH PROPOFOL N/A 02/04/2019   Procedure: COLONOSCOPY WITH PROPOFOL;  Surgeon: [name], MD;  Location: [facility] ENDOSCOPY;  Service: Endoscopy;  Laterality: N/A;   COLONOSCOPY WITH PROPOFOL N/A 03/18/2021   Procedure: COLONOSCOPY WITH PROPOFOL;  Surgeon: [name], MD;  Location: [facility] ENDOSCOPY;  Service: Endoscopy;  Laterality: N/A;  DM   DIAGNOSTIC LAPAROSCOPY     ESOPHAGOGASTRODUODENOSCOPY (EGD) WITH PROPOFOL N/A 02/04/2019   Procedure: ESOPHAGOGASTRODUODENOSCOPY (EGD) WITH PROPOFOL;  Surgeon: [name], MD;  Location: [facility] ENDOSCOPY;  Service: Endoscopy;  Laterality: N/A;   ESOPHAGOGASTRODUODENOSCOPY (EGD) WITH PROPOFOL N/A 03/18/2021   Procedure: ESOPHAGOGASTRODUODENOSCOPY (EGD) WITH PROPOFOL;  Surgeon: [name], MD;  Location: [facility] ENDOSCOPY;  Service: Endoscopy;  Laterality: N/A;   LOOP RECORDER INSERTION N/A 08/05/2021   Procedure: LOOP RECORDER INSERTION;  Surgeon: [name], MD;  Location: [facility] INVASIVE CV LAB;  Service: Cardiovascular;  Laterality: N/A;   SPINE SURGERY     TUBAL LIGATION     Past Medical History:  Diagnosis Date   Abnormal levels of other serum enzymes 07/17/2015   Arthritis    Constipation 07/11/2015   COVID-19 03/06/2021   Diabetes mellitus without complication (HCC)    Elevated liver enzymes    Fatty liver    Generalized abdominal pain 07/11/2015   Hyperlipidemia    Hypertension    Lifelong obesity    Macular degeneration    Morbid obesity due to excess calories (HCC) 07/11/2015   Other fatigue 07/11/2015   Sleep apnea    Spinal headache    BP 127/70   Pulse 83   Ht 5\' 2"  (1.575 m)   Wt 194 lb (88 kg)   SpO2 95%   BMI 35.48 kg/m   Opioid Risk Score:   Fall Risk Score:  `1  Depression screen [facility] 2/9     09/16/2021   11:25 AM 08/16/2020   10:15 AM 11/23/2019   10:25 AM 12/16/2018   10:47 AM 12/10/2017    1:09 PM  08/31/2017    9:33 AM 04/27/2017    3:44 PM  Depression screen PHQ 2/9  Decreased Interest 0 0 0 0 0 0 0  Down, Depressed, Hopeless 0 0 0 0 0 0 0  PHQ - 2 Score 0 0 0 0 0 0 0  Altered sleeping 0        Tired, decreased energy 3  Change in appetite 3        Feeling bad or failure about yourself  0        Trouble concentrating 0        Moving slowly or fidgety/restless 0        Suicidal thoughts 0        PHQ-9 Score 6          Review of Systems  Musculoskeletal:  Positive for back pain and gait problem.  Neurological:  Positive for weakness (left hand) and headaches.  All other systems reviewed and are negative.      Objective:   Physical Exam Gen: no distress, normal appearing, BMI 35.48, BP 135/62, weight 194 lbs, accompanied by son HEENT: oral mucosa pink and moist, NCAT Cardio: Reg rate Chest: normal effort, normal rate of breathing Abd: soft, non-distended Ext: no edema Psych: pleasant, normal affect, flat Skin: intact, no open lesions on feet, +onchomychosis of toe nails Neuro: Alert and oriented x3     Assessment & Plan:   1) CVA -continue therapies -would benefit from handicap placard to increase mobility in the community -reviewed all medications and provided necessary refills.  2) Diabetic peripheral neuropathy -Discussed Qutenza as an option for neuropathic pain control. Discussed that this is a capsaicin patch, stronger than capsaicin cream. Discussed that it is currently approved for diabetic peripheral neuropathy and post-herpetic neuralgia, but that it has also shown benefit in treating other forms of neuropathy. Provided patient with link to site to learn more about the patch: [employer]. Discussed that the patch would be placed in office and benefits usually last 3 months. Discussed that unintended exposure to capsaicin can cause severe irritation of eyes, mucous membranes, respiratory tract, and skin, but that Qutenza is a local treatment  and does not have the systemic side effects of other nerve medications. Discussed that there may be pain, itching, erythema, and decreased sensory function associated with the application of Qutenza. Side effects usually subside within 1 week. A cold pack of analgesic medications can help with these side effects. Blood pressure can also be increased due to pain associated with administration of the patch.   -HgbA1c reviewed and is 7.4 -continue metformin -continue gabapentin -discussed that CBGs have been around 170s  3) Onchomycosis: mycostatin ordered.

## 2022-07-04 NOTE — Telephone Encounter (Signed)
Reviewed with Dr. Ladona Ridgel today in office.  He agrees with Afib clinic referral and will have them evaluate for AV nodal blocker addition as appropriate.   Spoke with patient, she is in agreement and will wait for a call from Pixie Casino, RN and her team to get scheduled with the afib clinic.

## 2022-07-04 NOTE — Telephone Encounter (Signed)
Received the following transmission alert today from [employer]:  ILR alert summary report received. Battery status OK. Normal device function. No new symptom, brady, or pause episodes. 19 new AF episodes, poor rate control, burden 35.6%, ASA only.  1 tachy event, in duration, HR 167, likely AF/AFL with RVR. Monthly summary reports and ROV/PRN Document routed yesterday with f/u scheduled today.  Forwarded to provider for [facility] referral Route for clinic requested update. [place]  Presenting rhythm on 12/14 ILR report today shows Tachy rhythm with a rate of 130's-140's.  Patient denies any symptoms yesterday or at present.  Forwarding to Dr. Ladona Ridgel to review and will follow up with patient.

## 2022-07-07 ENCOUNTER — Ambulatory Visit (INDEPENDENT_AMBULATORY_CARE_PROVIDER_SITE_OTHER): Payer: Medicare HMO

## 2022-07-07 DIAGNOSIS — I639 Cerebral infarction, unspecified: Secondary | ICD-10-CM

## 2022-07-07 LAB — CUP PACEART REMOTE DEVICE CHECK
Date Time Interrogation Session: 20231217231719
Implantable Pulse Generator Implant Date: 20230116

## 2022-07-07 NOTE — Progress Notes (Signed)
Carelink Summary Report / Loop Recorder 

## 2022-07-08 ENCOUNTER — Ambulatory Visit: Payer: Medicare HMO | Admitting: Speech Pathology

## 2022-07-08 ENCOUNTER — Ambulatory Visit: Payer: Medicare HMO | Admitting: Occupational Therapy

## 2022-07-10 ENCOUNTER — Ambulatory Visit: Payer: Medicare HMO

## 2022-07-10 ENCOUNTER — Ambulatory Visit: Payer: Medicare HMO | Admitting: Occupational Therapy

## 2022-07-10 DIAGNOSIS — M6281 Muscle weakness (generalized): Secondary | ICD-10-CM

## 2022-07-10 DIAGNOSIS — R2681 Unsteadiness on feet: Secondary | ICD-10-CM

## 2022-07-10 DIAGNOSIS — R41841 Cognitive communication deficit: Secondary | ICD-10-CM

## 2022-07-10 DIAGNOSIS — R262 Difficulty in walking, not elsewhere classified: Secondary | ICD-10-CM

## 2022-07-10 DIAGNOSIS — I63511 Cerebral infarction due to unspecified occlusion or stenosis of right middle cerebral artery: Secondary | ICD-10-CM

## 2022-07-10 DIAGNOSIS — R278 Other lack of coordination: Secondary | ICD-10-CM

## 2022-07-10 NOTE — Therapy (Signed)
OUTPATIENT PHYSICAL THERAPY NEURO TREATMENT    Patient Name: Alyssa Mayo MRN: [id number] DOB:10-24-1945, 76 y.o., female Today's Date: 07/10/2022   PCP: [place] Pink, MD REFERRING PROVIDER: Frann Rider, NP   PT End of Session - 07/10/22 1108     Visit Number 22    Number of Visits 25    Date for PT Re-Evaluation 09/16/22    Authorization Type [employer]    Authorization Time Period 04/02/2022-06/25/2022; 06/24/2022-09/16/2022    Progress Note Due on Visit 20    PT Start Time 1109    PT Stop Time 1145    PT Time Calculation (min) 36 min    Equipment Utilized During Treatment Gait belt    Activity Tolerance Patient tolerated treatment well    Behavior During Therapy [facility] for tasks assessed/performed              Past Medical History:  Diagnosis Date   Abnormal levels of other serum enzymes 07/17/2015   Arthritis    Constipation 07/11/2015   COVID-19 03/06/2021   Diabetes mellitus without complication ([place])    Elevated liver enzymes    Fatty liver    Generalized abdominal pain 07/11/2015   Hyperlipidemia    Hypertension    Lifelong obesity    Macular degeneration    Morbid obesity due to excess calories ([place]) 07/11/2015   Other fatigue 07/11/2015   Sleep apnea    Spinal headache    Past Surgical History:  Procedure Laterality Date   ABDOMINAL HYSTERECTOMY     APPENDECTOMY     CATARACT EXTRACTION     CERVICAL LAMINECTOMY  07/21/1985   CESAREAN SECTION     COLONOSCOPY  07/21/2012   diverticulosis, [facility] Dr. Donnella Sham    COLONOSCOPY WITH PROPOFOL N/A 02/04/2019   Procedure: COLONOSCOPY WITH PROPOFOL;  Surgeon: Lollie Sails, MD;  Location: [facility] ENDOSCOPY;  Service: Endoscopy;  Laterality: N/A;   COLONOSCOPY WITH PROPOFOL N/A 03/18/2021   Procedure: COLONOSCOPY WITH PROPOFOL;  Surgeon: Lesly Rubenstein, MD;  Location: [facility] ENDOSCOPY;  Service: Endoscopy;  Laterality: N/A;  DM   DIAGNOSTIC LAPAROSCOPY     ESOPHAGOGASTRODUODENOSCOPY (EGD) WITH  PROPOFOL N/A 02/04/2019   Procedure: ESOPHAGOGASTRODUODENOSCOPY (EGD) WITH PROPOFOL;  Surgeon: Lollie Sails, MD;  Location: [facility] ENDOSCOPY;  Service: Endoscopy;  Laterality: N/A;   ESOPHAGOGASTRODUODENOSCOPY (EGD) WITH PROPOFOL N/A 03/18/2021   Procedure: ESOPHAGOGASTRODUODENOSCOPY (EGD) WITH PROPOFOL;  Surgeon: Lesly Rubenstein, MD;  Location: [facility] ENDOSCOPY;  Service: Endoscopy;  Laterality: N/A;   LOOP RECORDER INSERTION N/A 08/05/2021   Procedure: LOOP RECORDER INSERTION;  Surgeon: Evans Lance, MD;  Location: [place] [facility];  Service: Cardiovascular;  Laterality: N/A;   [place]     TUBAL LIGATION     Patient Active Problem List   Diagnosis Date Noted   Cerebral edema ([place]) 08/07/2021   OSA (obstructive sleep apnea) 08/07/2021   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   CVA (cerebral vascular accident) ([place]) 08/01/2021   GERD (gastroesophageal reflux disease) 07/20/2019   Advanced care planning/counseling discussion 12/17/2016   Skin lesions, generalized 11/13/2016   Chronic right hip pain 06/17/2016   Vitamin D deficiency 09/26/2015   Diastasis recti 08/08/2015   Elevated serum GGT level 07/24/2015   Essential hypertension 07/17/2015   Fatty liver 07/17/2015   Elevated alkaline phosphatase level 07/17/2015   Elevated serum glutamic pyruvic transaminase (SGPT) level 07/17/2015   Abnormal finding on EKG 07/11/2015   Morbid obesity ([place]) 07/11/2015   Colon, diverticulosis 07/11/2015  Abdominal wall hernia 07/11/2015   DM (diabetes mellitus), type 2 with neurological complications [facility])    Hyperlipidemia     ONSET DATE: January 11th 2023  REFERRING DIAG:  I69.398,R26.9 (ICD-10-CM) - Gait disturbance, post-stroke  I63.511 (ICD-10-CM) - Right middle cerebral artery stroke (HCC)    THERAPY DIAG:  Muscle weakness (generalized)  Other lack of coordination  Cognitive communication deficit  Right middle cerebral artery stroke  ([place])  Difficulty in walking, not elsewhere classified  Unsteadiness on feet  Rationale for Evaluation and Treatment Rehabilitation  SUBJECTIVE: Pt states she doesn't have an appointment with her cardiologist in January.  Pt notes she has not felt her HR being high since that episode.  Pt also notes that she was checked by the MD that saw her in the hospital when she had a visit.     Pt accompanied by: self  PERTINENT HISTORY: Per chart and confirmed by pt:  Pt is a 76 yo female with RMCA CVA infarction with hemorrhagic transformation 08/01/2021, with inpatient rehab 08/07/2021-08/22/2021 followed by [facility] therapy until June. Pt now currently being seen for outpatient OT and ST services. Pt reports limitations in stamina persist. Other PMH is significant for HTN, HLD, macular degeneration, DM, obesity, sleep apnea, arthritis, chronic R hip pain, vitamin D deficiency, diastasis recti, abdominal wall hernia, hx of abdominal hysterectomy, appendectomy, spine surgery (years ago).   PAIN:  Are you having pain? No Pain Location:    TODAY'S TREATMENT:  BP taken upon entering into the clinic BP: 140/63 HR: 82  TherEx:  Seated LAQ with 5# AW donned, 2x15 Seated marches with 5# AW donned, 2x15 Seated hip adduction into physioball, 2x15, 3 sec holds Seated hamstring curls with BTB, 2x15 Seated hip abduction into BTB, 2x15  Neuro:  Static stance on airex pad, 30 sec bouts Dynamic marching on airex pad, 30 sec bouts Standing hang-man exercise for cognitive task while standing on airex pad and dynamic reaching for letters at the whiteboard    PATIENT EDUCATION: Education details: exam findings, indications, plan, HEP Person educated: Patient Education method: Consulting civil engineer, Demonstration, Verbal cues, and Handouts Education comprehension: verbalized understanding, returned demonstration, verbal cues required, and needs further education   HOME EXERCISE PROGRAM:  Access Code:  [email or web address] URL: [email or web address] Date: 06/02/2022 Prepared by: [name]  Exercises - Standing Single Leg Stance with Counter Support  - 2 x daily - 7 x weekly - 1 sets - 10 reps - 10s  hold - Standing Marching  - 2 x daily - 7 x weekly - 2 sets - 20 reps - Side Stepping with Counter Support  - 2 x daily - 7 x weekly - 2 sets - 20 reps - Walking  - 2 x daily - 7 x weekly    GOALS: Goals reviewed with patient? Yes  SHORT TERM GOALS: Target date: 08/21/2022  Patient will be independent in home exercise program to improve strength/mobility for better functional independence with ADLs. Baseline: initiated; Patient reports walking but not always independent with progressive HEP. Will keep goal active to anticipate more balance ex for HEP.  Goal status: PROGRESSING   LONG TERM GOALS: Target date: 10/02/2022  Patient will increase FOTO score to equal to or greater than 65  to demonstrate improvement in mobility and quality of life.  Baseline: 57 06/24/2022= 61 Goal status: IN PROGRESS  2.  Patient (> 71 years old) will complete five times sit to stand test in < 15 seconds indicating an increased LE strength and  improved balance. Baseline: 17 sec hands-free 05/21/2022 - 15.36 sec 06/24/2022 - 13.56 sec  Goal status: GOAL MET  3.  Patient will increase 10 meter walk test to >1.65ms as to improve gait speed for better community ambulation and to reduce fall risk. Baseline: 0.66 m/s; 11/1:  0.85 m/s 06/24/2022= 0.97 m/s without and AD Goal status: IN PROGRESS  4.  Patient will increase Berg Balance score by > 3 points to demonstrate decreased fall risk during functional activities. Baseline: 50/56 06/24/2022= 52/56 Goal status: IN PROGRESS  5.  Patient will increase six minute walk test distance to >1000 for progression to community ambulator and improve gait ability Baseline: 04/10/22: 6767f(pain onset at 5 minutes) 05/21/22: 1027 ft 06/23/2022= 940 feet - will keep  goal active until patient able to consistently achieve > 1000 feet Goal status: IN PROGRESS    ASSESSMENT:  CLINICAL IMPRESSION:  Pt actively participated in therapy session and put forth good effort throughout the session.  Pt challenged with the hangman exercise and the balance associated with reaching outside her cone of support and the cognitive task of solving the word.  Pt performed well with strengthening exercises and is demonstrating improved muscular recruitment.  Pt will continue to improve overall function and mobility while challenging her cognitive function with tasks going forward.   Pt will continue to benefit from skilled therapy to address remaining deficits in order to improve overall QoL and return to PLOF.       OBJECTIVE IMPAIRMENTS Abnormal gait, decreased activity tolerance, decreased balance, decreased coordination, decreased endurance, decreased mobility, difficulty walking, decreased strength, impaired sensation, impaired UE functional use, improper body mechanics, postural dysfunction, and pain.   ACTIVITY LIMITATIONS carrying, lifting, bending, standing, squatting, stairs, bed mobility, bathing, reach over head, hygiene/grooming, and locomotion level  PARTICIPATION LIMITATIONS: meal prep, cleaning, laundry, medication management, personal finances, driving, shopping, community activity, and yard work  PERSONAL FACTORS Age, Sex, Time since onset of injury/illness/exacerbation, and 3+ comorbidities: Other PMH is significant for HTN, HLD, macular degeneration, DM, obesity, sleep apnea, arthritis, chronic R hip pain, vitamin D deficiency, diastasis recti, abdominal wall hernia, hx of abdominal hysterectomy, appendectomy, spine surgery (years ago).   are also affecting patient's functional outcome.   REHAB POTENTIAL: Good  CLINICAL DECISION MAKING: Evolving/moderate complexity  EVALUATION COMPLEXITY: Moderate  PLAN: PT FREQUENCY: 2x/week  PT DURATION: 12  weeks  PLANNED INTERVENTIONS: Therapeutic exercises, Therapeutic activity, Neuromuscular re-education, Balance training, Gait training, Patient/Family education, Self Care, Joint mobilization, Joint manipulation, Stair training, Vestibular training, Canalith repositioning, Orthotic/Fit training, DME instructions, Dry Needling, Electrical stimulation, Wheelchair mobility training, Spinal mobilization, Cryotherapy, Moist heat, Splintting, Taping, Traction, Ultrasound, Biofeedback, Manual therapy, and Re-evaluation  PLAN FOR NEXT SESSION:   LE strengthening: leg press/squats/etc. HEP compliance and introduce more cognitive related tasks.   JoGwenlyn SaranPT, DPT Physical Therapist- CoAntelope Valley Surgery Center LP12/21/23, 11:09 AM

## 2022-07-10 NOTE — Therapy (Addendum)
Occupational Therapy Treatment Note  Patient Name: Alyssa Mayo MRN: [id number] DOB:1946/01/16, 75 y.o., female Today's Date: 03/14/2022  PCP: Alyssa Mina, MD REFERRING PROVIDER: [name], NP    OT End of Session - 07/10/22 1150     Visit Number 25    Number of Visits 48    Date for OT Re-Evaluation 08/19/22    Authorization Type Progress reporting period starting 06/19/2022    OT Start Time 1145    OT Stop Time 1230    OT Time Calculation (min) 45 min    Activity Tolerance Patient tolerated treatment well    Behavior During Therapy [facility] for tasks assessed/performed                           Past Medical History:  Diagnosis Date   Abnormal levels of other serum enzymes 07/17/2015   Arthritis    Constipation 07/11/2015   COVID-19 03/06/2021   Diabetes mellitus without complication (HCC)    Elevated liver enzymes    Fatty liver    Generalized abdominal pain 07/11/2015   Hyperlipidemia    Hypertension    Lifelong obesity    Macular degeneration    Morbid obesity due to excess calories (HCC) 07/11/2015   Other fatigue 07/11/2015   Sleep apnea    Spinal headache    Past Surgical History:  Procedure Laterality Date   ABDOMINAL HYSTERECTOMY     APPENDECTOMY     CATARACT EXTRACTION     CERVICAL LAMINECTOMY  07/21/1985   CESAREAN SECTION     COLONOSCOPY  07/21/2012   diverticulosis, [facility] Dr. Ricki Mayo    COLONOSCOPY WITH PROPOFOL N/A 02/04/2019   Procedure: COLONOSCOPY WITH PROPOFOL;  Surgeon: Alyssa Deem, MD;  Location: [facility] ENDOSCOPY;  Service: Endoscopy;  Laterality: N/A;   COLONOSCOPY WITH PROPOFOL N/A 03/18/2021   Procedure: COLONOSCOPY WITH PROPOFOL;  Surgeon: Alyssa Bill, MD;  Location: [facility] ENDOSCOPY;  Service: Endoscopy;  Laterality: N/A;  DM   DIAGNOSTIC LAPAROSCOPY     ESOPHAGOGASTRODUODENOSCOPY (EGD) WITH PROPOFOL N/A 02/04/2019   Procedure: ESOPHAGOGASTRODUODENOSCOPY (EGD) WITH PROPOFOL;  Surgeon: Alyssa Deem, MD;  Location: [facility] ENDOSCOPY;  Service: Endoscopy;  Laterality: N/A;   ESOPHAGOGASTRODUODENOSCOPY (EGD) WITH PROPOFOL N/A 03/18/2021   Procedure: ESOPHAGOGASTRODUODENOSCOPY (EGD) WITH PROPOFOL;  Surgeon: Alyssa Bill, MD;  Location: [facility] ENDOSCOPY;  Service: Endoscopy;  Laterality: N/A;   LOOP RECORDER INSERTION N/A 08/05/2021   Procedure: LOOP RECORDER INSERTION;  Surgeon: Alyssa Maw, MD;  Location: [facility] INVASIVE CV LAB;  Service: Cardiovascular;  Laterality: N/A;   SPINE SURGERY     TUBAL LIGATION     Patient Active Problem List   Diagnosis Date Noted   Cerebral edema (HCC) 08/07/2021   OSA (obstructive sleep apnea) 08/07/2021   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   CVA (cerebral vascular accident) (HCC) 08/01/2021   GERD (gastroesophageal reflux disease) 07/20/2019   Advanced care planning/counseling discussion 12/17/2016   Skin lesions, generalized 11/13/2016   Chronic right hip pain 06/17/2016   Vitamin D deficiency 09/26/2015   Diastasis recti 08/08/2015   Elevated serum GGT level 07/24/2015   Essential hypertension 07/17/2015   Fatty liver 07/17/2015   Elevated alkaline phosphatase level 07/17/2015   Elevated serum glutamic pyruvic transaminase (SGPT) level 07/17/2015   Abnormal finding on EKG 07/11/2015   Morbid obesity (HCC) 07/11/2015   Colon, diverticulosis 07/11/2015   Abdominal wall hernia 07/11/2015  DM (diabetes mellitus), type 2 with neurological complications (HCC)    Hyperlipidemia     ONSET DATE: 08/01/2021  REFERRING DIAG: CVA  THERAPY DIAG:  Muscle weakness (generalized)  Other lack of coordination  Rationale for Evaluation and Treatment Rehabilitation  SUBJECTIVE:   SUBJECTIVE STATEMENT:   Pt. reports that  her grandson is feeling better.  Pt accompanied by: self  PERTINENT HISTORY:  Pt. is a 76 y.o. female who presents with a diagnosis of RMCA CVA infarction with hemorrhagic transformation. Pt. attended  inpatient rehab from 08/07/2021-08/22/2021. Pt. received Home health therapy services this past spring.  Pt. has recently had an assessment through driver rehabilitation services at [facility] with recommendations for referrals for  outpatient OT/PT, and ST services. Pt. PMHx includes: HTN, Hyperlipidemia, Macular degeneration, DM, and Obesity.  PRECAUTIONS: None  WEIGHT BEARING RESTRICTIONS No  PAIN:  Are you having pain? No.    FALLS: Has patient fallen in last 6 months? Yes.  LIVING ENVIRONMENT: Lives with: lives with their family  Son  Alyssa Mayo Lives in: House/apartment Main living are on one floor Stairs:  2 steps to enter Has following equipment at home: Single point cane, Wheelchair (manual), shower chair, Grab bars, bed rail, and rubber mat.  PLOF: Independent  PATIENT GOALS: To be able to Drive, and cook again  OBJECTIVE:   HAND DOMINANCE: Right   ADLs: Overall ADLs:  Transfers/ambulation related to ADLs: Independent Eating: Independent Grooming: Pt. Fatigues with sustained BUE's in elevation for haircare. Especially with blow drying hair. UB Dressing: Independent pullover shirt, independent now with fastening a bra LB Dressing: Independent donning pants socks, and slide on shoes Toileting: Independent Bathing: Independent Tub Shower transfers: Walk-in shower Independent now   IADLs: Shopping: Needs to be accompanied to the grocery store. Light housekeeping: Does own laundry, laundry,  Meal Prep:  Difficulty with cooking Community mobility: Relies on son, and friend Medication management: Son sets up Mining engineer management: Manages monthly bills independently. Handwriting: No change from baseline  MOBILITY STATUS: Hx of falls out of bed   ACTIVITY TOLERANCE: Activity tolerance:  10-20 min. Before rest break  FUNCTIONAL OUTCOME MEASURES: FOTO: 57  TR score: 62  UPPER EXTREMITY ROM     Active ROM Right eval Left eval Left 05/06/2022 Left 05/27/2022  Left 06/19/2022   Shoulder flexion WFL 96(110) 107(110) 109(120) 112(126)  Shoulder abduction WFL 82(105) 86(105) 92(115) 94(115)  Shoulder adduction       Shoulder extension       Shoulder internal rotation       Shoulder external rotation       Elbow flexion [facility] WFL  Elbow extension [facility] WFL  Wrist flexion WFL 44(60) 68(68) WFL WFL  Wrist extension WFL 32(60) 66(66) WFL WFL  Wrist ulnar deviation       Wrist radial deviation       Wrist pronation       Wrist supination       (Blank rows = not tested)   UPPER EXTREMITY MMT:     MMT Right eval Left eval Left 05/06/2022 Left 05/27/2022 Left 06/19/2022  Shoulder flexion 4+/5 3-/5 3+/5 3+/5 3+/5  Shoulder abduction 4+/5 3-/5 3-/5 3+/5 3+/5  Shoulder adduction       Shoulder extension       Shoulder internal rotation       Shoulder external rotation       Middle trapezius       Lower trapezius  Elbow flexion 4+/5 4-/5 4+/5 5/5 5/5  Elbow extension 4+/5 4-/5 4+/5 5/5 5/5  Wrist flexion 4+/5 3-/5 4/5 4+/5 4+/5  Wrist extension 4+/5 3/5 4/5 4+/5 4+/5  Wrist ulnar deviation       Wrist radial deviation       Wrist pronation       Wrist supination       (Blank rows = not tested)  HAND FUNCTION: Grip strength: Right: 30 lbs; Left: 13 lbs, Lateral pinch: Right: 15 lbs, Left: 8 lbs, and 3 point pinch: Right: 13 lbs, Left: 7 lbs  05/06/2022 Grip strength: Right: 30 lbs; Left: 14 lbs, Lateral pinch: Right: 15 lbs, Left: 10 lbs, and 3 point pinch: Right: 13 lbs, Left: 7 lbs  05/27/2022:  Grip strength: Right: 30 lbs; Left: 17 lbs, Lateral pinch: Right: 15 lbs, Left: 11 lbs, and 3 point pinch: Right: 13 lbs, Left: 7 lbs  06/19/2022:  Grip strength: Right: 30 lbs; Left: 18 lbs, Lateral pinch: Right: 15 lbs, Left:  10 lbs, and 3 point pinch: Right: 13 lbs, Left: 8 lbs   COORDINATION:  9 Hole Peg test: Right: 26  sec; Left: 1 min. & 9 sec  05/06/2022 9 Hole Peg test: Right: 26  sec; Left: 1  min.  05/27/2022  9 Hole Peg test: Right: 26  sec; Left: 1 min. 4 sec.   06/19/2022  9 Hole Peg test: Right: 26  sec; Left: 1 min.   SENSATION: Light touch: WFL Proprioception: WFL  COGNITION: Overall cognitive status: Within functional limits for tasks assessed  VISION: Subjective report: Glasses. Just received a new prescription to increase bifocal strength Baseline vision: Macular Degeneration Visual history: macular degeneration  VISION ASSESSMENT: To be further assessed in functional context  Left sided Awareness    PERCEPTION: Limited left sided awareness   TODAY'S TREATMENT:   Self-care:  Pt. worked on completing the Pillbox Test. Pt. was able to read aloud the instructions with 100% accuracy. Pt. completed the pillbox test in 9 min& 50 sec. Pt. Passed with completing the directions with 100% accuracy. Pt. then worked on left hand [facility] skills the simulated pill beads, storing them in her hand, and translatory movements moving them through her hand from the palm to the tip of her 2nd digit and thumb in preparation for placing them into a container. Pt. required increased time, and rest breaks when attempting to dual task [facility] skills with holding a conversation. Pt. continues to work on improving, and maximizing overall independence with ADLs, and IADL tasks.        PATIENT EDUCATION: Education details:  Simulated  Person educated: Patient Education method: verbal cues Education comprehension: verbalized understanding and pt does not plan to cook unless son is present.   HOME EXERCISE PROGRAM:  Continue with an ongoing assessment of HEP needs    GOALS: Goals reviewed with patient? Yes   SHORT TERM GOALS: Target date:  04/15/2022      Pt. Will improve FOTO score by 2 points to reflect improved pt. perceived functional performance Baseline: FOTO: 57, TR score: 62 10th visit: FOTO: 65, 20th visit: FOTO 62, TR score: 62 Goal status: Achieved  LONG TERM  GOALS: Target date: 08/19/2022    Pt. Will improve left UE strength by 2 mm grades to assist with repositioning her grandson on her lap. Baseline: Eval: left shoulder flexion: 3-/5, abduction 3-/5,  elbow flex/ext. 4-/5, wrist extension 3/5, wrist flexion 3-/5  10th visit: left shoulder flexion: 3+/5, abduction  3-+5,  elbow flex/ext. 4+/5, wrist extension 4/5, wrist flexion 4/5 05/27/2022: left shoulder flexion: 3+/5, abduction 3+/5,  elbow flex/ext. 5/5, wrist extension 4+/5, wrist flexion 4+/5  20th visit: left shoulder flexion: 3+/5, abduction 3+/5,  elbow flex/ext. 5/5, wrist extension 4+/5, wrist flexion 4+/5. Pt. Can now hold her grandson grandson on her lap, and reposition him. Pt. Is unable to hold him in standing. Goal status: Ongoing  2.  Pt. will improve left grip strength by 5# to be able to independently hold and use a blow dryer, and brush for hair care. Baseline: Eval: Left grip strength 13#. Pt. Has difficulty performing hair care, holding and using a brush, and blow dryer. 10th visit: Left grip 14#. Pt. Is now able to hold th e blow dryer for the sides, and top of head, not the back. 11/07: Left 17# Pt. Has difficulty blow drying then back of her head.20th visit: Left grip 18#. Pt. Is able to independently hold a brush, and blow dry her hair.  Goal status: Achieved  3.  Pt. Will improve left lateral pinch strength by 3# to be able to independently cut meat. Baseline: Eval: Left 8, Pt. Has difficulty cutting meat. 10th: Left: 10#.  Pt. Is able to cut tender meat, however is not as efficient 11/07: Left 11#. Pt. Is independent cutting meat with increased time. 20th visit: Left: 10#. Pt. Has difficulty positioning, and stabilizing the utensils while cutting meat. Goal status:  Achieved  4.  Pt. Will improve Left hand [facility] skills to be able to be able to independently, and efficiently manipulate small objects during ADL tasks. Baseline: Eval: left: 1 min. 9 sec. 10th visit: Left: 1 min.  Pt. Is improving with manipulating small objects. 11/07: Left: 1 min. & 4 sec. Pt. Has difficulty manipulating small objects. 20th visit: Left:  1 min. Pt. Is improving with grasping pills, however continues to have difficulty. Goal status: Ongoing   5.  Pt. Will perform light meal preparation with modified independence Baseline: Eval: Pt. Reports having difficulty performing light  meal preparation, and cooking tasks. 10th visit: Pt. Is able to make toast in the morning, a sandwich,  use the microwave, make coffee, and pour herself a drink. 11/07: Pt. Has difficulty holding a coffee pot when filling the reservoir 20th visit: Pt. Is making waffles in the toaster, heats items in in the microwave, Pt. Uses the oven when supervised.  Goal status: Ongoing  CLINICAL IMPRESSION:  Pt. worked on completing the Pillbox Test. Pt. was able to read aloud the instructions with 100% accuracy. Pt. completed the pillbox test in 9 min& 50 sec. Pt. Passed with completing the directions with 100% accuracy. Pt. then worked on left hand [facility] skills the simulated pill beads, storing them in her hand, and translatory movements moving them through her hand from the palm to the tip of her 2nd digit and thumb in preparation for placing them into a container. Pt. required increased time, and rest breaks when attempting to dual task [facility] skills with holding a conversation. Pt. continues to work on improving, and maximizing overall independence with ADLs, and IADL tasks.        PERFORMANCE DEFICITS in functional skills including ADLs, IADLs, coordination, dexterity, sensation, ROM, strength, FMC, decreased knowledge of use of DME, vision, and UE functional use, cognitive skills including attention, and psychosocial skills including coping strategies, environmental adaptation, habits, and routines and behaviors.   IMPAIRMENTS are limiting patient from ADLs, IADLs, and social participation.  COMORBIDITIES may have co-morbidities   that affects occupational performance. Patient will benefit from skilled OT to address above impairments and improve overall function.  MODIFICATION OR ASSISTANCE TO COMPLETE EVALUATION: Min-Moderate modification of tasks or assist with assess necessary to complete an evaluation.  OT OCCUPATIONAL PROFILE AND HISTORY: Detailed assessment: Review of records and additional review of physical, cognitive, psychosocial history related to current functional performance.  CLINICAL DECISION MAKING: Moderate - several treatment options, min-mod task modification necessary  REHAB POTENTIAL: Good  EVALUATION COMPLEXITY: Moderate    PLAN: OT FREQUENCY: 2x/week  OT DURATION: 12 weeks  PLANNED INTERVENTIONS: self care/ADL training, therapeutic exercise, therapeutic activity, neuromuscular re-education, manual therapy, passive range of motion, and paraffin  RECOMMENDED OTHER SERVICES: ST, and PT  CONSULTED AND AGREED WITH PLAN OF CARE: Patient  PLAN FOR NEXT SESSION:  L hand strengthening and coordination exercises    Olegario Messier, MS, OTR/L

## 2022-07-15 ENCOUNTER — Ambulatory Visit: Payer: Medicare HMO | Admitting: Speech Pathology

## 2022-07-15 ENCOUNTER — Ambulatory Visit: Payer: Medicare HMO | Admitting: Occupational Therapy

## 2022-07-15 DIAGNOSIS — R41841 Cognitive communication deficit: Secondary | ICD-10-CM | POA: Diagnosis not present

## 2022-07-15 DIAGNOSIS — M6281 Muscle weakness (generalized): Secondary | ICD-10-CM

## 2022-07-15 NOTE — Therapy (Signed)
Occupational Therapy Treatment Note  Patient Name: Alyssa Mayo MRN: [id number] DOB:Aug 27, 1945, 76 y.o., female Today's Date: 03/14/2022  PCP: [name], MD REFERRING PROVIDER: [name], NP    OT End of Session - 07/15/22 1107     Visit Number 26    Number of Visits 48    Date for OT Re-Evaluation 08/19/22    Authorization Type Progress reporting period starting 06/19/2022    OT Start Time 1100    OT Stop Time 1145    OT Time Calculation (min) 45 min    Activity Tolerance Patient tolerated treatment well    Behavior During Therapy [facility] for tasks assessed/performed                           Past Medical History:  Diagnosis Date   Abnormal levels of other serum enzymes 07/17/2015   Arthritis    Constipation 07/11/2015   COVID-19 03/06/2021   Diabetes mellitus without complication (HCC)    Elevated liver enzymes    Fatty liver    Generalized abdominal pain 07/11/2015   Hyperlipidemia    Hypertension    Lifelong obesity    Macular degeneration    Morbid obesity due to excess calories (HCC) 07/11/2015   Other fatigue 07/11/2015   Sleep apnea    Spinal headache    Past Surgical History:  Procedure Laterality Date   ABDOMINAL HYSTERECTOMY     APPENDECTOMY     CATARACT EXTRACTION     CERVICAL LAMINECTOMY  07/21/1985   CESAREAN SECTION     COLONOSCOPY  07/21/2012   diverticulosis, [facility] Dr. [name]    COLONOSCOPY WITH PROPOFOL N/A 02/04/2019   Procedure: COLONOSCOPY WITH PROPOFOL;  Surgeon: [name], MD;  Location: [facility] ENDOSCOPY;  Service: Endoscopy;  Laterality: N/A;   COLONOSCOPY WITH PROPOFOL N/A 03/18/2021   Procedure: COLONOSCOPY WITH PROPOFOL;  Surgeon: [name], MD;  Location: [facility] ENDOSCOPY;  Service: Endoscopy;  Laterality: N/A;  DM   DIAGNOSTIC LAPAROSCOPY     ESOPHAGOGASTRODUODENOSCOPY (EGD) WITH PROPOFOL N/A 02/04/2019   Procedure: ESOPHAGOGASTRODUODENOSCOPY (EGD) WITH PROPOFOL;  Surgeon: [name], MD;  Location: [facility] ENDOSCOPY;  Service: Endoscopy;  Laterality: N/A;   ESOPHAGOGASTRODUODENOSCOPY (EGD) WITH PROPOFOL N/A 03/18/2021   Procedure: ESOPHAGOGASTRODUODENOSCOPY (EGD) WITH PROPOFOL;  Surgeon: [name], MD;  Location: [facility] ENDOSCOPY;  Service: Endoscopy;  Laterality: N/A;   LOOP RECORDER INSERTION N/A 08/05/2021   Procedure: LOOP RECORDER INSERTION;  Surgeon: [name], MD;  Location: [facility] INVASIVE CV LAB;  Service: Cardiovascular;  Laterality: N/A;   SPINE SURGERY     TUBAL LIGATION     Patient Active Problem List   Diagnosis Date Noted   Cerebral edema (HCC) 08/07/2021   OSA (obstructive sleep apnea) 08/07/2021   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   CVA (cerebral vascular accident) (HCC) 08/01/2021   GERD (gastroesophageal reflux disease) 07/20/2019   Advanced care planning/counseling discussion 12/17/2016   Skin lesions, generalized 11/13/2016   Chronic right hip pain 06/17/2016   Vitamin D deficiency 09/26/2015   Diastasis recti 08/08/2015   Elevated serum GGT level 07/24/2015   Essential hypertension 07/17/2015   Fatty liver 07/17/2015   Elevated alkaline phosphatase level 07/17/2015   Elevated serum glutamic pyruvic transaminase (SGPT) level 07/17/2015   Abnormal finding on EKG 07/11/2015   Morbid obesity (HCC) 07/11/2015   Colon, diverticulosis 07/11/2015   Abdominal wall hernia 07/11/2015  DM (diabetes mellitus), type 2 with neurological complications (HCC)    Hyperlipidemia     ONSET DATE: 08/01/2021  REFERRING DIAG: CVA  THERAPY DIAG:  Muscle weakness (generalized)  Rationale for Evaluation and Treatment Rehabilitation  SUBJECTIVE:   SUBJECTIVE STATEMENT:   Pt. reports that she had a quiet holiday.  Pt accompanied by: self  PERTINENT HISTORY:  Pt. is a 76 y.o. female who presents with a diagnosis of RMCA CVA infarction with hemorrhagic transformation. Pt. attended inpatient rehab from 08/07/2021-08/22/2021.  Pt. received Home health therapy services this past spring.  Pt. has recently had an assessment through driver rehabilitation services at [facility] with recommendations for referrals for  outpatient OT/PT, and ST services. Pt. PMHx includes: HTN, Hyperlipidemia, Macular degeneration, DM, and Obesity.  PRECAUTIONS: None  WEIGHT BEARING RESTRICTIONS No  PAIN:  Are you having pain? No.    FALLS: Has patient fallen in last 6 months? Yes.  LIVING ENVIRONMENT: Lives with: lives with their family  Son  [name] Lives in: House/apartment Main living are on one floor Stairs:  2 steps to enter Has following equipment at home: Single point cane, Wheelchair (manual), shower chair, Grab bars, bed rail, and rubber mat.  PLOF: Independent  PATIENT GOALS: To be able to Drive, and cook again  OBJECTIVE:   HAND DOMINANCE: Right   ADLs: Overall ADLs:  Transfers/ambulation related to ADLs: Independent Eating: Independent Grooming: Pt. Fatigues with sustained BUE's in elevation for haircare. Especially with blow drying hair. UB Dressing: Independent pullover shirt, independent now with fastening a bra LB Dressing: Independent donning pants socks, and slide on shoes Toileting: Independent Bathing: Independent Tub Shower transfers: Walk-in shower Independent now   IADLs: Shopping: Needs to be accompanied to the grocery store. Light housekeeping: Does own laundry, laundry,  Meal Prep:  Difficulty with cooking Community mobility: Relies on son, and friend Medication management: Son sets up Mining engineer management: Manages monthly bills independently. Handwriting: No change from baseline  MOBILITY STATUS: Hx of falls out of bed   ACTIVITY TOLERANCE: Activity tolerance:  10-20 min. Before rest break  FUNCTIONAL OUTCOME MEASURES: FOTO: 57  TR score: 62  UPPER EXTREMITY ROM     Active ROM Right eval Left eval Left 05/06/2022 Left 05/27/2022 Left 06/19/2022   Shoulder flexion WFL 96(110)  107(110) 109(120) 112(126)  Shoulder abduction WFL 82(105) 86(105) 92(115) 94(115)  Shoulder adduction       Shoulder extension       Shoulder internal rotation       Shoulder external rotation       Elbow flexion [facility] WFL  Elbow extension [facility] WFL  Wrist flexion WFL 44(60) 68(68) WFL WFL  Wrist extension WFL 32(60) 66(66) WFL WFL  Wrist ulnar deviation       Wrist radial deviation       Wrist pronation       Wrist supination       (Blank rows = not tested)   UPPER EXTREMITY MMT:     MMT Right eval Left eval Left 05/06/2022 Left 05/27/2022 Left 06/19/2022  Shoulder flexion 4+/5 3-/5 3+/5 3+/5 3+/5  Shoulder abduction 4+/5 3-/5 3-/5 3+/5 3+/5  Shoulder adduction       Shoulder extension       Shoulder internal rotation       Shoulder external rotation       Middle trapezius       Lower trapezius       Elbow flexion  4+/5 4-/5 4+/5 5/5 5/5  Elbow extension 4+/5 4-/5 4+/5 5/5 5/5  Wrist flexion 4+/5 3-/5 4/5 4+/5 4+/5  Wrist extension 4+/5 3/5 4/5 4+/5 4+/5  Wrist ulnar deviation       Wrist radial deviation       Wrist pronation       Wrist supination       (Blank rows = not tested)  HAND FUNCTION: Grip strength: Right: 30 lbs; Left: 13 lbs, Lateral pinch: Right: 15 lbs, Left: 8 lbs, and 3 point pinch: Right: 13 lbs, Left: 7 lbs  05/06/2022 Grip strength: Right: 30 lbs; Left: 14 lbs, Lateral pinch: Right: 15 lbs, Left: 10 lbs, and 3 point pinch: Right: 13 lbs, Left: 7 lbs  05/27/2022:  Grip strength: Right: 30 lbs; Left: 17 lbs, Lateral pinch: Right: 15 lbs, Left: 11 lbs, and 3 point pinch: Right: 13 lbs, Left: 7 lbs  06/19/2022:  Grip strength: Right: 30 lbs; Left: 18 lbs, Lateral pinch: Right: 15 lbs, Left:  10 lbs, and 3 point pinch: Right: 13 lbs, Left: 8 lbs   COORDINATION:  9 Hole Peg test: Right: 26  sec; Left: 1 min. & 9 sec  05/06/2022 9 Hole Peg test: Right: 26  sec; Left: 1 min.  05/27/2022  9 Hole Peg test: Right: 26   sec; Left: 1 min. 4 sec.   06/19/2022  9 Hole Peg test: Right: 26  sec; Left: 1 min.   SENSATION: Light touch: WFL Proprioception: WFL  COGNITION: Overall cognitive status: Within functional limits for tasks assessed  VISION: Subjective report: Glasses. Just received a new prescription to increase bifocal strength Baseline vision: Macular Degeneration Visual history: macular degeneration  VISION ASSESSMENT: To be further assessed in functional context  Left sided Awareness    PERCEPTION: Limited left sided awareness   TODAY'S TREATMENT:   Pt. Worked on the Sci[employer] for 8 min. With constant monitoring of the BUEs. Pt. Worked on changing, and alternating forward reverse position every 2 min. Rest breaks were required.  Pt. performed resistive EZ Board exercises for left forearm supination/pronation, wrist flexion/extension using gross grasp, and lateral pinch (key) grasp. Pt. performed resistive EZ Board exercises angled in several planes to promote shoulder flexion, abduction, and wrist flexion, and extension while performing resistive wrist flexion and extension with a gross grip. Pt. worked on grasping one inch resistive cubes alternating thumb opposition to the tip of the 2nd through 5th digits. The board was positioned at a vertical angle. Pt. worked on pressing them back into place while isolating 2nd through 5th digits. Pt. Worked on grasping one inch resistive cubes using 2pt, and 3pt. resistive pinch  The board was positioned at a vertical angle. Pt. worked on pressing them back into place with isolated 2nd digit extension.         PATIENT EDUCATION: Education details:  Simulated  Person educated: Patient Education method: verbal cues Education comprehension: verbalized understanding and pt does not plan to cook unless son is present.   HOME EXERCISE PROGRAM:  Continue with an ongoing assessment of HEP needs    GOALS: Goals reviewed with patient? Yes   SHORT  TERM GOALS: Target date:  04/15/2022      Pt. Will improve FOTO score by 2 points to reflect improved pt. perceived functional performance Baseline: FOTO: 57, TR score: 62 10th visit: FOTO: 65, 20th visit: FOTO 62, TR score: 62 Goal status: Achieved  LONG TERM GOALS: Target date: 08/19/2022    Pt. Will improve  left UE strength by 2 mm grades to assist with repositioning her grandson on her lap. Baseline: Eval: left shoulder flexion: 3-/5, abduction 3-/5,  elbow flex/ext. 4-/5, wrist extension 3/5, wrist flexion 3-/5  10th visit: left shoulder flexion: 3+/5, abduction 3-+5,  elbow flex/ext. 4+/5, wrist extension 4/5, wrist flexion 4/5 05/27/2022: left shoulder flexion: 3+/5, abduction 3+/5,  elbow flex/ext. 5/5, wrist extension 4+/5, wrist flexion 4+/5  20th visit: left shoulder flexion: 3+/5, abduction 3+/5,  elbow flex/ext. 5/5, wrist extension 4+/5, wrist flexion 4+/5. Pt. Can now hold her grandson grandson on her lap, and reposition him. Pt. Is unable to hold him in standing. Goal status: Ongoing  2.  Pt. will improve left grip strength by 5# to be able to independently hold and use a blow dryer, and brush for hair care. Baseline: Eval: Left grip strength 13#. Pt. Has difficulty performing hair care, holding and using a brush, and blow dryer. 10th visit: Left grip 14#. Pt. Is now able to hold th e blow dryer for the sides, and top of head, not the back. 11/07: Left 17# Pt. Has difficulty blow drying then back of her head.20th visit: Left grip 18#. Pt. Is able to independently hold a brush, and blow dry her hair.  Goal status: Achieved  3.  Pt. Will improve left lateral pinch strength by 3# to be able to independently cut meat. Baseline: Eval: Left 8, Pt. Has difficulty cutting meat. 10th: Left: 10#.  Pt. Is able to cut tender meat, however is not as efficient 11/07: Left 11#. Pt. Is independent cutting meat with increased time. 20th visit: Left: 10#. Pt. Has difficulty positioning, and  stabilizing the utensils while cutting meat. Goal status:  Achieved  4.  Pt. Will improve Left hand [facility] skills to be able to be able to independently, and efficiently manipulate small objects during ADL tasks. Baseline: Eval: left: 1 min. 9 sec. 10th visit: Left: 1 min. Pt. Is improving with manipulating small objects. 11/07: Left: 1 min. & 4 sec. Pt. Has difficulty manipulating small objects. 20th visit: Left:  1 min. Pt. Is improving with grasping pills, however continues to have difficulty. Goal status: Ongoing   5.  Pt. Will perform light meal preparation with modified independence Baseline: Eval: Pt. Reports having difficulty performing light  meal preparation, and cooking tasks. 10th visit: Pt. Is able to make toast in the morning, a sandwich,  use the microwave, make coffee, and pour herself a drink. 11/07: Pt. Has difficulty holding a coffee pot when filling the reservoir 20th visit: Pt. Is making waffles in the toaster, heats items in in the microwave, Pt. Uses the oven when supervised.  Goal status: Ongoing  CLINICAL IMPRESSION:  Pt. presents with no pain today. Pt. reports having had her eyes dilated this morning at her eye MD appointment. Pt. tolerated the BUE exercises well today. Pt. required cues for visual demonstration of hand placement, technique, and motor planning through the EZ Board, and cube task. Pt. continues to work on improving, and maximizing overall independence with ADLs, and IADL tasks.        PERFORMANCE DEFICITS in functional skills including ADLs, IADLs, coordination, dexterity, sensation, ROM, strength, FMC, decreased knowledge of use of DME, vision, and UE functional use, cognitive skills including attention, and psychosocial skills including coping strategies, environmental adaptation, habits, and routines and behaviors.   IMPAIRMENTS are limiting patient from ADLs, IADLs, and social participation.   COMORBIDITIES may have co-morbidities  that affects  occupational performance. Patient  will benefit from skilled OT to address above impairments and improve overall function.  MODIFICATION OR ASSISTANCE TO COMPLETE EVALUATION: Min-Moderate modification of tasks or assist with assess necessary to complete an evaluation.  OT OCCUPATIONAL PROFILE AND HISTORY: Detailed assessment: Review of records and additional review of physical, cognitive, psychosocial history related to current functional performance.  CLINICAL DECISION MAKING: Moderate - several treatment options, min-mod task modification necessary  REHAB POTENTIAL: Good  EVALUATION COMPLEXITY: Moderate    PLAN: OT FREQUENCY: 2x/week  OT DURATION: 12 weeks  PLANNED INTERVENTIONS: self care/ADL training, therapeutic exercise, therapeutic activity, neuromuscular re-education, manual therapy, passive range of motion, and paraffin  RECOMMENDED OTHER SERVICES: ST, and PT  CONSULTED AND AGREED WITH PLAN OF CARE: Patient  PLAN FOR NEXT SESSION:  L hand strengthening and coordination exercises    Olegario Messier, MS, OTR/L

## 2022-07-17 ENCOUNTER — Ambulatory Visit: Payer: Medicare HMO

## 2022-07-17 ENCOUNTER — Telehealth: Payer: Self-pay | Admitting: Occupational Therapy

## 2022-07-17 ENCOUNTER — Ambulatory Visit: Payer: Medicare HMO | Admitting: Occupational Therapy

## 2022-07-17 ENCOUNTER — Ambulatory Visit: Payer: Medicare HMO | Admitting: Speech Pathology

## 2022-07-17 DIAGNOSIS — R262 Difficulty in walking, not elsewhere classified: Secondary | ICD-10-CM

## 2022-07-17 DIAGNOSIS — I63511 Cerebral infarction due to unspecified occlusion or stenosis of right middle cerebral artery: Secondary | ICD-10-CM

## 2022-07-17 DIAGNOSIS — R278 Other lack of coordination: Secondary | ICD-10-CM

## 2022-07-17 DIAGNOSIS — M6281 Muscle weakness (generalized): Secondary | ICD-10-CM

## 2022-07-17 DIAGNOSIS — R2681 Unsteadiness on feet: Secondary | ICD-10-CM

## 2022-07-17 DIAGNOSIS — R41841 Cognitive communication deficit: Secondary | ICD-10-CM | POA: Diagnosis not present

## 2022-07-17 NOTE — Telephone Encounter (Signed)
Pt. did not arrive this morning for her OT appointment. A follow-up phone call was made, and a general message was left on voicemail with the return telephone number for the rehab clinic.

## 2022-07-17 NOTE — Therapy (Signed)
OUTPATIENT PHYSICAL THERAPY NEURO TREATMENT    Patient Name: Alyssa Mayo MRN: [id number] DOB:[birth year], 76 y.o., female Today's Date: 07/17/2022   PCP: [place] Pink, MD REFERRING PROVIDER: Frann Rider, NP   PT End of Session - 07/17/22 1109     Visit Number 23    Number of Visits 25    Date for PT Re-Evaluation 09/16/22    Authorization Type [employer]    Authorization Time Period 04/02/2022-06/25/2022; 06/24/2022-09/16/2022    Progress Note Due on Visit 20    PT Start Time 1104    PT Stop Time 1145    PT Time Calculation (min) 41 min    Equipment Utilized During Treatment Gait belt    Activity Tolerance Patient tolerated treatment well    Behavior During Therapy [facility] for tasks assessed/performed              Past Medical History:  Diagnosis Date   Abnormal levels of other serum enzymes 07/17/2015   Arthritis    Constipation 07/11/2015   COVID-19 03/06/2021   Diabetes mellitus without complication ([place])    Elevated liver enzymes    Fatty liver    Generalized abdominal pain 07/11/2015   Hyperlipidemia    Hypertension    Lifelong obesity    Macular degeneration    Morbid obesity due to excess calories ([place]) 07/11/2015   Other fatigue 07/11/2015   Sleep apnea    Spinal headache    Past Surgical History:  Procedure Laterality Date   ABDOMINAL HYSTERECTOMY     APPENDECTOMY     CATARACT EXTRACTION     CERVICAL LAMINECTOMY  07/21/1985   CESAREAN SECTION     COLONOSCOPY  07/21/2012   diverticulosis, [facility] Dr. Donnella Sham    COLONOSCOPY WITH PROPOFOL N/A 02/04/2019   Procedure: COLONOSCOPY WITH PROPOFOL;  Surgeon: Lollie Sails, MD;  Location: [facility] ENDOSCOPY;  Service: Endoscopy;  Laterality: N/A;   COLONOSCOPY WITH PROPOFOL N/A 03/18/2021   Procedure: COLONOSCOPY WITH PROPOFOL;  Surgeon: Lesly Rubenstein, MD;  Location: [facility] ENDOSCOPY;  Service: Endoscopy;  Laterality: N/A;  DM   DIAGNOSTIC LAPAROSCOPY     ESOPHAGOGASTRODUODENOSCOPY (EGD) WITH  PROPOFOL N/A 02/04/2019   Procedure: ESOPHAGOGASTRODUODENOSCOPY (EGD) WITH PROPOFOL;  Surgeon: Lollie Sails, MD;  Location: [facility] ENDOSCOPY;  Service: Endoscopy;  Laterality: N/A;   ESOPHAGOGASTRODUODENOSCOPY (EGD) WITH PROPOFOL N/A 03/18/2021   Procedure: ESOPHAGOGASTRODUODENOSCOPY (EGD) WITH PROPOFOL;  Surgeon: Lesly Rubenstein, MD;  Location: [facility] ENDOSCOPY;  Service: Endoscopy;  Laterality: N/A;   LOOP RECORDER INSERTION N/A 08/05/2021   Procedure: LOOP RECORDER INSERTION;  Surgeon: Evans Lance, MD;  Location: [place] CV LAB;  Service: Cardiovascular;  Laterality: N/A;   [place]     TUBAL LIGATION     Patient Active Problem List   Diagnosis Date Noted   Cerebral edema ([place]) 08/07/2021   OSA (obstructive sleep apnea) 08/07/2021   Right middle cerebral artery stroke ([place]) 08/07/2021   CVA (cerebral vascular accident) ([address]) 08/01/2021   GERD (gastroesophageal reflux disease) 07/20/2019   Advanced care planning/counseling discussion 12/17/2016   Skin lesions, generalized 11/13/2016   Chronic right hip pain 06/17/2016   Vitamin D deficiency 09/26/2015   Diastasis recti 08/08/2015   Elevated serum GGT level 07/24/2015   Essential hypertension 07/17/2015   Fatty liver 07/17/2015   Elevated alkaline phosphatase level 07/17/2015   Elevated serum glutamic pyruvic transaminase (SGPT) level 07/17/2015   Abnormal finding on EKG 07/11/2015   Morbid obesity ([place]) 07/11/2015   Colon, diverticulosis 07/11/2015  Abdominal wall hernia 07/11/2015   DM (diabetes mellitus), type 2 with neurological complications [facility])    Hyperlipidemia     ONSET DATE: January 11th 2023  REFERRING DIAG:  I69.398,R26.9 (ICD-10-CM) - Gait disturbance, post-stroke  I63.511 (ICD-10-CM) - Right middle cerebral artery stroke (HCC)    THERAPY DIAG:  Muscle weakness (generalized)  Other lack of coordination  Cognitive communication deficit  Right middle cerebral artery stroke  ([place])  Difficulty in walking, not elsewhere classified  Unsteadiness on feet  Rationale for Evaluation and Treatment Rehabilitation  SUBJECTIVE:  Pt reports she had a good Christmas and she got a heat blanket for Christmas.  Pt notes she did a lot of walking yesterday at [facility].     Pt accompanied by: self  PERTINENT HISTORY: Per chart and confirmed by pt:  Pt is a 76 yo female with RMCA CVA infarction with hemorrhagic transformation 08/01/2021, with inpatient rehab 08/07/2021-08/22/2021 followed by [facility] therapy until June. Pt now currently being seen for outpatient OT and ST services. Pt reports limitations in stamina persist. Other PMH is significant for HTN, HLD, macular degeneration, DM, obesity, sleep apnea, arthritis, chronic R hip pain, vitamin D deficiency, diastasis recti, abdominal wall hernia, hx of abdominal hysterectomy, appendectomy, spine surgery (years ago).   PAIN:  Are you having pain?  2/10 ache Pain Location:  lumbar spine after walking around [facility] TREATMENT:  BP taken upon entering into the clinic BP: 135/85 HR: 86  TherEx:  Seated LAQ with 5# AW donned, 2x15 Seated marches with 5# AW donned, 2x15 Seated hip adduction into physioball, 2x15, 3 sec holds Seated hamstring curls with BTB, 2x15 Seated hip abduction into BTB, 2x15 Standing heel raises, 5# AW donned, 2x15 STS with Airex pad on floor to increase hip flexion for increased challenge, 2x10     PATIENT EDUCATION: Education details: exam findings, indications, plan, HEP Person educated: Patient Education method: Consulting civil engineer, Demonstration, Verbal cues, and Handouts Education comprehension: verbalized understanding, returned demonstration, verbal cues required, and needs further education   HOME EXERCISE PROGRAM:  Access Code: [email or web address] URL: [email or web address] Date: 06/02/2022 Prepared by: [name]  Exercises - Standing Single Leg Stance with Counter  Support  - 2 x daily - 7 x weekly - 1 sets - 10 reps - 10s  hold - Standing Marching  - 2 x daily - 7 x weekly - 2 sets - 20 reps - Side Stepping with Counter Support  - 2 x daily - 7 x weekly - 2 sets - 20 reps - Walking  - 2 x daily - 7 x weekly    GOALS:  Goals reviewed with patient? Yes  SHORT TERM GOALS: Target date: 08/28/2022  Patient will be independent in home exercise program to improve strength/mobility for better functional independence with ADLs. Baseline: initiated; Patient reports walking but not always independent with progressive HEP. Will keep goal active to anticipate more balance ex for HEP.  Goal status: PROGRESSING   LONG TERM GOALS: Target date: 10/09/2022  Patient will increase FOTO score to equal to or greater than 65  to demonstrate improvement in mobility and quality of life.  Baseline: 57 06/24/2022= 61 Goal status: IN PROGRESS  2.  Patient (> 63 years old) will complete five times sit to stand test in < 15 seconds indicating an increased LE strength and improved balance. Baseline: 17 sec hands-free 05/21/2022 - 15.36 sec 06/24/2022 - 13.56 sec  Goal status: GOAL MET  3.  Patient will increase  10 meter walk test to >1.53ms as to improve gait speed for better community ambulation and to reduce fall risk. Baseline: 0.66 m/s; 11/1:  0.85 m/s 06/24/2022= 0.97 m/s without and AD Goal status: IN PROGRESS  4.  Patient will increase Berg Balance score by > 3 points to demonstrate decreased fall risk during functional activities. Baseline: 50/56 06/24/2022= 52/56 Goal status: IN PROGRESS  5.  Patient will increase six minute walk test distance to >1000 for progression to community ambulator and improve gait ability Baseline: 04/10/22: 6713f(pain onset at 5 minutes) 05/21/22: 1027 ft 06/23/2022= 940 feet - will keep goal active until patient able to consistently achieve > 1000 feet Goal status: IN PROGRESS    ASSESSMENT:  CLINICAL IMPRESSION:  Pt continues  to make good progress with tasks and is able to tolerate increased resistance while performing the exercises.  Pt put forth great effort throughout the session and noted increased ability to perform STS.  Pt required less verbal and other forms of cuing during session today and was able to undergo increased reps of the exercises.  Pt encouraged to continue with exercises at home in order to improve overall mobility and function.   Pt will continue to benefit from skilled therapy to address remaining deficits in order to improve overall QoL and return to PLOF.        OBJECTIVE IMPAIRMENTS Abnormal gait, decreased activity tolerance, decreased balance, decreased coordination, decreased endurance, decreased mobility, difficulty walking, decreased strength, impaired sensation, impaired UE functional use, improper body mechanics, postural dysfunction, and pain.   ACTIVITY LIMITATIONS carrying, lifting, bending, standing, squatting, stairs, bed mobility, bathing, reach over head, hygiene/grooming, and locomotion level  PARTICIPATION LIMITATIONS: meal prep, cleaning, laundry, medication management, personal finances, driving, shopping, community activity, and yard work  PERSONAL FACTORS Age, Sex, Time since onset of injury/illness/exacerbation, and 3+ comorbidities: Other PMH is significant for HTN, HLD, macular degeneration, DM, obesity, sleep apnea, arthritis, chronic R hip pain, vitamin D deficiency, diastasis recti, abdominal wall hernia, hx of abdominal hysterectomy, appendectomy, spine surgery (years ago).   are also affecting patient's functional outcome.   REHAB POTENTIAL: Good  CLINICAL DECISION MAKING: Evolving/moderate complexity  EVALUATION COMPLEXITY: Moderate  PLAN: PT FREQUENCY: 2x/week  PT DURATION: 12 weeks  PLANNED INTERVENTIONS: Therapeutic exercises, Therapeutic activity, Neuromuscular re-education, Balance training, Gait training, Patient/Family education, Self Care, Joint  mobilization, Joint manipulation, Stair training, Vestibular training, Canalith repositioning, Orthotic/Fit training, DME instructions, Dry Needling, Electrical stimulation, Wheelchair mobility training, Spinal mobilization, Cryotherapy, Moist heat, Splintting, Taping, Traction, Ultrasound, Biofeedback, Manual therapy, and Re-evaluation  PLAN FOR NEXT SESSION:   LE strengthening: leg press/squats/etc. HEP compliance and introduce more cognitive related tasks.   JoGwenlyn SaranPT, DPT Physical Therapist- [facility]12/28/23, 11:09 AM

## 2022-07-21 NOTE — Therapy (Signed)
OUTPATIENT PHYSICAL THERAPY NEURO TREATMENT    Patient Name: Alyssa Mayo MRN: [id number] DOB:05/01/1946, 76 y.o., female Today's Date: 07/22/2022   PCP: Hedrick, James, MD REFERRING PROVIDER: McCue, Jessica, NP   PT End of Session - 07/22/22 1104     Visit Number 24    Number of Visits 25    Date for PT Re-Evaluation 09/16/22    Authorization Type [employer] Medicare    Authorization Time Period 04/02/2022-06/25/2022; 06/24/2022-09/16/2022    Progress Note Due on Visit 20    PT Start Time 1103    PT Stop Time 1145    PT Time Calculation (min) 42 min    Equipment Utilized During Treatment Gait belt    Activity Tolerance Patient tolerated treatment well    Behavior During Therapy WFL for tasks assessed/performed             Past Medical History:  Diagnosis Date   Abnormal levels of other serum enzymes 07/17/2015   Arthritis    Constipation 07/11/2015   COVID-19 03/06/2021   Diabetes mellitus without complication (HCC)    Elevated liver enzymes    Fatty liver    Generalized abdominal pain 07/11/2015   Hyperlipidemia    Hypertension    Lifelong obesity    Macular degeneration    Morbid obesity due to excess calories (HCC) 07/11/2015   Other fatigue 07/11/2015   Sleep apnea    Spinal headache    Past Surgical History:  Procedure Laterality Date   ABDOMINAL HYSTERECTOMY     APPENDECTOMY     CATARACT EXTRACTION     CERVICAL LAMINECTOMY  07/21/1985   CESAREAN SECTION     COLONOSCOPY  07/21/2012   diverticulosis, [facility] Dr. Skulski    COLONOSCOPY WITH PROPOFOL N/A 02/04/2019   Procedure: COLONOSCOPY WITH PROPOFOL;  Surgeon: Skulskie, Martin U, MD;  Location: [facility] ENDOSCOPY;  Service: Endoscopy;  Laterality: N/A;   COLONOSCOPY WITH PROPOFOL N/A 03/18/2021   Procedure: COLONOSCOPY WITH PROPOFOL;  Surgeon: Locklear, Cameron T, MD;  Location: [facility] ENDOSCOPY;  Service: Endoscopy;  Laterality: N/A;  DM   DIAGNOSTIC LAPAROSCOPY     ESOPHAGOGASTRODUODENOSCOPY (EGD) WITH  PROPOFOL N/A 02/04/2019   Procedure: ESOPHAGOGASTRODUODENOSCOPY (EGD) WITH PROPOFOL;  Surgeon: Skulskie, Martin U, MD;  Location: [facility] ENDOSCOPY;  Service: Endoscopy;  Laterality: N/A;   ESOPHAGOGASTRODUODENOSCOPY (EGD) WITH PROPOFOL N/A 03/18/2021   Procedure: ESOPHAGOGASTRODUODENOSCOPY (EGD) WITH PROPOFOL;  Surgeon: Locklear, Cameron T, MD;  Location: [facility] ENDOSCOPY;  Service: Endoscopy;  Laterality: N/A;   LOOP RECORDER INSERTION N/A 08/05/2021   Procedure: LOOP RECORDER INSERTION;  Surgeon: Taylor, Gregg W, MD;  Location: [facility] INVASIVE CV LAB;  Service: Cardiovascular;  Laterality: N/A;   SPINE SURGERY     TUBAL LIGATION     Patient Active Problem List   Diagnosis Date Noted   Cerebral edema (HCC) 08/07/2021   OSA (obstructive sleep apnea) 08/07/2021   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   CVA (cerebral vascular accident) (HCC) 08/01/2021   GERD (gastroesophageal reflux disease) 07/20/2019   Advanced care planning/counseling discussion 12/17/2016   Skin lesions, generalized 11/13/2016   Chronic right hip pain 06/17/2016   Vitamin D deficiency 09/26/2015   Diastasis recti 08/08/2015   Elevated serum GGT level 07/24/2015   Essential hypertension 07/17/2015   Fatty liver 07/17/2015   Elevated alkaline phosphatase level 07/17/2015   Elevated serum glutamic pyruvic transaminase (SGPT) level 07/17/2015   Abnormal finding on EKG 07/11/2015   Morbid obesity (HCC) 07/11/2015   Colon, diverticulosis 07/11/2015     Abdominal wall hernia 07/11/2015   DM (diabetes mellitus), type 2 with neurological complications (HCC)    Hyperlipidemia     ONSET DATE: January 11th 2023  REFERRING DIAG:  I69.398,R26.9 (ICD-10-CM) - Gait disturbance, post-stroke  I63.511 (ICD-10-CM) - Right middle cerebral artery stroke (HCC)    THERAPY DIAG:  Cognitive communication deficit  Right middle cerebral artery stroke (HCC)  Muscle weakness (generalized)  Other lack of  coordination  Difficulty in walking, not elsewhere classified  Unsteadiness on feet  Rationale for Evaluation and Treatment Rehabilitation  SUBJECTIVE:  Pt reports she had a good New Year's and was able to stay up and watch the ball drop.  Pt also notes she was able to go to [place], [place] to spend time with her son who was turning 50.  They celebrated his birthday, Christmas, and Kwanzaa all over the weekend.  Pt accompanied by: self  PERTINENT HISTORY: Per chart and confirmed by pt:  Pt is a 76 yo female with RMCA CVA infarction with hemorrhagic transformation 08/01/2021, with inpatient rehab 08/07/2021-08/22/2021 followed by HH therapy until June. Pt now currently being seen for outpatient OT and ST services. Pt reports limitations in stamina persist. Other PMH is significant for HTN, HLD, macular degeneration, DM, obesity, sleep apnea, arthritis, chronic R hip pain, vitamin D deficiency, diastasis recti, abdominal wall hernia, hx of abdominal hysterectomy, appendectomy, spine surgery (years ago).   PAIN:  Are you having pain?  2/10 ache Pain Location:  lumbar spine after walking around [place]   TODAY'S TREATMENT:  BP taken upon entering into the clinic BP: 144/59 HR: 75  TherEx:  STS with Airex pad on floor to increase hip flexion for increased challenge, 2x10 Standing dead lift with 15# kettle bell, 2x10  Ambulation up 2 flights of stairs, then down the medical mall to the next stairwell, down two flights of steps, then past EVS and back to gym.  Pt notes some discomfort in the middle of the back and fatigue, but otherwise doing well.   Neuro:  Static stance on airex pad, 30 sec bouts x2 Marching on airex pad, 30 sec bouts x2 Static stance on incline board, 30 sec bouts, x2 Airex pad static stance, 30 seconds bouts x2 attempts Aired pad static stance with eyes closed, 30 sec bouts, x2 attempts Airex pad NBOS, 30 seconds bouts, x2 attempts Aired pad NBOS with eyes  closed, 30 sec bouts, x2 attempts  SLS on stable surface, 30 sec bouts, x2 each LE Tandem stance on stable surface, 30 sec bouts, x2 each LE leading    PATIENT EDUCATION: Education details: exam findings, indications, plan, HEP Person educated: Patient Education method: Explanation, Demonstration, Verbal cues, and Handouts Education comprehension: verbalized understanding, returned demonstration, verbal cues required, and needs further education   HOME EXERCISE PROGRAM:  Access Code: [email or web address] URL: [email or web address] Date: 06/02/2022 Prepared by: Allan Buccola  Exercises - Standing Single Leg Stance with Counter Support  - 2 x daily - 7 x weekly - 1 sets - 10 reps - 10s  hold - Standing Marching  - 2 x daily - 7 x weekly - 2 sets - 20 reps - Side Stepping with Counter Support  - 2 x daily - 7 x weekly - 2 sets - 20 reps - Walking  - 2 x daily - 7 x weekly    GOALS:  Goals reviewed with patient? Yes  SHORT TERM GOALS: Target date: 09/02/2022  Patient will be independent in home exercise program to improve   strength/mobility for better functional independence with ADLs. Baseline: initiated; Patient reports walking but not always independent with progressive HEP. Will keep goal active to anticipate more balance ex for HEP.  Goal status: PROGRESSING   LONG TERM GOALS: Target date: 10/14/2022  Patient will increase FOTO score to equal to or greater than 65  to demonstrate improvement in mobility and quality of life.  Baseline: 57 06/24/2022= 61 Goal status: IN PROGRESS  2.  Patient (> 60 years old) will complete five times sit to stand test in < 15 seconds indicating an increased LE strength and improved balance. Baseline: 17 sec hands-free 05/21/2022 - 15.36 sec 06/24/2022 - 13.56 sec  Goal status: GOAL MET  3.  Patient will increase 10 meter walk test to >1.0m/s as to improve gait speed for better community ambulation and to reduce fall risk. Baseline:  0.66 m/s; 11/1:  0.85 m/s 06/24/2022= 0.97 m/s without an AD Goal status: IN PROGRESS  4.  Patient will increase Berg Balance score by > 3 points to demonstrate decreased fall risk during functional activities. Baseline: 50/56 06/24/2022= 52/56 Goal status: IN PROGRESS  5.  Patient will increase six minute walk test distance to >1000 for progression to community ambulator and improve gait ability Baseline: 04/10/22: 675ft (pain onset at 5 minutes) 05/21/22: 1027 ft 06/23/2022= 940 feet - will keep goal active until patient able to consistently achieve > 1000 feet Goal status: IN PROGRESS    ASSESSMENT:  CLINICAL IMPRESSION:  Pt performing well and was able to tolerate tasks and exercises that challenged her balance and endurance throughout the sessions today.  Pt and therapist discussed the option of discharge for the following visit.  Pt agreeable and feels ready to d/c at next visit and will be reassessed for goals at that point in time.   Pt will continue to benefit from skilled therapy to address remaining deficits in order to improve overall QoL and return to PLOF.      OBJECTIVE IMPAIRMENTS Abnormal gait, decreased activity tolerance, decreased balance, decreased coordination, decreased endurance, decreased mobility, difficulty walking, decreased strength, impaired sensation, impaired UE functional use, improper body mechanics, postural dysfunction, and pain.   ACTIVITY LIMITATIONS carrying, lifting, bending, standing, squatting, stairs, bed mobility, bathing, reach over head, hygiene/grooming, and locomotion level  PARTICIPATION LIMITATIONS: meal prep, cleaning, laundry, medication management, personal finances, driving, shopping, community activity, and yard work  PERSONAL FACTORS Age, Sex, Time since onset of injury/illness/exacerbation, and 3+ comorbidities: Other PMH is significant for HTN, HLD, macular degeneration, DM, obesity, sleep apnea, arthritis, chronic R hip pain,  vitamin D deficiency, diastasis recti, abdominal wall hernia, hx of abdominal hysterectomy, appendectomy, spine surgery (years ago).   are also affecting patient's functional outcome.   REHAB POTENTIAL: Good  CLINICAL DECISION MAKING: Evolving/moderate complexity  EVALUATION COMPLEXITY: Moderate  PLAN: PT FREQUENCY: 2x/week  PT DURATION: 12 weeks  PLANNED INTERVENTIONS: Therapeutic exercises, Therapeutic activity, Neuromuscular re-education, Balance training, Gait training, Patient/Family education, Self Care, Joint mobilization, Joint manipulation, Stair training, Vestibular training, Canalith repositioning, Orthotic/Fit training, DME instructions, Dry Needling, Electrical stimulation, Wheelchair mobility training, Spinal mobilization, Cryotherapy, Moist heat, Splintting, Taping, Traction, Ultrasound, Biofeedback, Manual therapy, and Re-evaluation  PLAN FOR NEXT SESSION:   LE strengthening: leg press/squats/etc. HEP compliance and introduce more cognitive related tasks.    , PT, DPT Physical Therapist- [facility]  07/22/22, 1:00 PM 

## 2022-07-22 ENCOUNTER — Ambulatory Visit: Payer: Medicare HMO

## 2022-07-22 ENCOUNTER — Ambulatory Visit: Payer: Medicare HMO | Attending: Adult Health | Admitting: Speech Pathology

## 2022-07-22 ENCOUNTER — Ambulatory Visit: Payer: Medicare HMO | Admitting: Occupational Therapy

## 2022-07-22 DIAGNOSIS — I63511 Cerebral infarction due to unspecified occlusion or stenosis of right middle cerebral artery: Secondary | ICD-10-CM | POA: Diagnosis present

## 2022-07-22 DIAGNOSIS — R41841 Cognitive communication deficit: Secondary | ICD-10-CM | POA: Diagnosis present

## 2022-07-22 DIAGNOSIS — M6281 Muscle weakness (generalized): Secondary | ICD-10-CM | POA: Diagnosis present

## 2022-07-22 DIAGNOSIS — R262 Difficulty in walking, not elsewhere classified: Secondary | ICD-10-CM | POA: Insufficient documentation

## 2022-07-22 DIAGNOSIS — R278 Other lack of coordination: Secondary | ICD-10-CM | POA: Diagnosis present

## 2022-07-22 DIAGNOSIS — R2681 Unsteadiness on feet: Secondary | ICD-10-CM | POA: Diagnosis present

## 2022-07-22 DIAGNOSIS — R482 Apraxia: Secondary | ICD-10-CM | POA: Diagnosis present

## 2022-07-22 NOTE — Therapy (Signed)
OUTPATIENT SPEECH LANGUAGE PATHOLOGY TREATMENT NOTE     Patient Name: Alyssa Mayo MRN: [id number] DOB:Jul 26, 1945, 77 y.o., female Today's Date: 07/22/2022  PCP: [place] Pink, MD REFERRING PROVIDER: Frann Rider, NP    End of Session - 07/22/22 1020     Visit Number 22    Number of Visits 27    Date for SLP Re-Evaluation 08/21/22    Authorization Type [employer] HMO/PPO    Progress Note Due on Visit 30    SLP Start Time 1005    SLP Stop Time  1100    SLP Time Calculation (min) 55 min    Activity Tolerance Patient tolerated treatment well                  Past Medical History:  Diagnosis Date   Abnormal levels of other serum enzymes 07/17/2015   Arthritis    Constipation 07/11/2015   COVID-19 03/06/2021   Diabetes mellitus without complication ([place])    Elevated liver enzymes    Fatty liver    Generalized abdominal pain 07/11/2015   Hyperlipidemia    Hypertension    Lifelong obesity    Macular degeneration    Morbid obesity due to excess calories ([place]) 07/11/2015   Other fatigue 07/11/2015   Sleep apnea    Spinal headache    Past Surgical History:  Procedure Laterality Date   ABDOMINAL HYSTERECTOMY     APPENDECTOMY     CATARACT EXTRACTION     CERVICAL LAMINECTOMY  07/21/1985   CESAREAN SECTION     COLONOSCOPY  07/21/2012   diverticulosis, [facility] Dr. Donnella Sham    COLONOSCOPY WITH PROPOFOL N/A 02/04/2019   Procedure: COLONOSCOPY WITH PROPOFOL;  Surgeon: Lollie Sails, MD;  Location: [facility] ENDOSCOPY;  Service: Endoscopy;  Laterality: N/A;   COLONOSCOPY WITH PROPOFOL N/A 03/18/2021   Procedure: COLONOSCOPY WITH PROPOFOL;  Surgeon: Lesly Rubenstein, MD;  Location: [facility] ENDOSCOPY;  Service: Endoscopy;  Laterality: N/A;  DM   DIAGNOSTIC LAPAROSCOPY     ESOPHAGOGASTRODUODENOSCOPY (EGD) WITH PROPOFOL N/A 02/04/2019   Procedure: ESOPHAGOGASTRODUODENOSCOPY (EGD) WITH PROPOFOL;  Surgeon: Lollie Sails, MD;  Location: [facility] ENDOSCOPY;  Service:  Endoscopy;  Laterality: N/A;   ESOPHAGOGASTRODUODENOSCOPY (EGD) WITH PROPOFOL N/A 03/18/2021   Procedure: ESOPHAGOGASTRODUODENOSCOPY (EGD) WITH PROPOFOL;  Surgeon: Lesly Rubenstein, MD;  Location: [facility] ENDOSCOPY;  Service: Endoscopy;  Laterality: N/A;   LOOP RECORDER INSERTION N/A 08/05/2021   Procedure: LOOP RECORDER INSERTION;  Surgeon: Evans Lance, MD;  Location: [place] CV LAB;  Service: Cardiovascular;  Laterality: N/A;   SPINE SURGERY     TUBAL LIGATION     Patient Active Problem List   Diagnosis Date Noted   Cerebral edema ([place]) 08/07/2021   OSA (obstructive sleep apnea) 08/07/2021   Right middle cerebral artery stroke ([place]) 08/07/2021   CVA (cerebral vascular accident) ([place]) 08/01/2021   GERD (gastroesophageal reflux disease) 07/20/2019   Advanced care planning/counseling discussion 12/17/2016   Skin lesions, generalized 11/13/2016   Chronic right hip pain 06/17/2016   Vitamin D deficiency 09/26/2015   Diastasis recti 08/08/2015   Elevated serum GGT level 07/24/2015   Essential hypertension 07/17/2015   Fatty liver 07/17/2015   Elevated alkaline phosphatase level 07/17/2015   Elevated serum glutamic pyruvic transaminase (SGPT) level 07/17/2015   Abnormal finding on EKG 07/11/2015   Morbid obesity ([place]) 07/11/2015   Colon, diverticulosis 07/11/2015   Abdominal wall hernia 07/11/2015   DM (diabetes mellitus), type 2 with neurological complications ([place])  Hyperlipidemia     ONSET DATE: 08/01/2021 CVA; 02/26/2022 date of referral   REFERRING DIAG: I69.319 (ICD-10-CM) - Cognitive deficit, post-stroke I63.511 (ICD-10-CM) - Right middle cerebral artery stroke [facility])     PERTINENT HISTORY: Alyssa Mayo is a 77 y.o. female with history of diabetes, hypertension, hyperlipidemia, obesity, and sleep apnea who presented to [facility] ED on 08/01/2021 after being found down by her son. Her last know well was approximately 07/29/2021. Pt transferred to [facility] for further CVA  management. In addition, pt received ST services in CIR from 08/08/2021 thru 08/21/2021.      DIAGNOSTIC FINDINGS: MRI showed extensive restricted diffusion throughout much of the right MCA territory with associated changes of hemorrhagic transformation and localized cerebral edema and mass effect. Per Dr. Leonie Man, felt embolic pattern secondary to unclear source although concerning for occult A fib. Repeat [facility] 1/14 showed no interval progression of right MCA hemorrhagic infarct with petechial hemorrhage.  CTA head/neck showed right M2 thrombus.  EF 60 to 65%.   THERAPY DIAG:  Cognitive communication deficit  Right middle cerebral artery stroke [facility])  Rationale for Evaluation and Treatment Rehabilitation  SUBJECTIVE: pt brought in word scramble that she received at her ladies' meeting for church, expressed SLP might be interested in the words listed "I thought about you"  Pt accompanied by: self  PAIN:  Are you having pain? No  PATIENT GOALS to be able to drive again and live independently (she currently lives alone but her son plans on moving in with her)  OBJECTIVE:   TODAY'S TREATMENT: Skilled treatment session focused on pt's cognitive communication goals. SLP facilitated the session by providing the following interventions:  Received email from son asking questions about pt's ability to drive  Missed 3 weeks d/t scheduling and pt missed most recently scheduled session d/t misreading her schedule  Pt required moderate verbal and visual assistance to achieve 75% accuracy with previously learned semi-complex problem solving task   Able to recall that she had PT immediately following ST but went to OT without immediate recall of needing to go to PT  PATIENT EDUCATION: Education details: external memory aids Person educated: Patient and Child(ren) Education method: Explanation, Demonstration, Verbal cues, and Handouts Education comprehension: needs further education  HOME  EXERCISE PROGRAM  Updating email - deleting and un-scribing emails  GOALS: Goals reviewed with patient? Yes  SHORT TERM GOALS: Target date: 10 sessions  UPDATED: 04/21/2022  UPDATED: 06/24/2022 With moderate assistance, pt will recall her medicines with 80% accuracy.  Baseline: currently unable 04/21/2022 Goal status: MET; 91% recall independently   2.  With minimal assistance, pt will complete basic medication management task with > 90% accuracy.  Baseline: total 04/21/2022 Goal status: ONGOING; currently requiring moderate to minimal assistance  06/24/2022 Goal Status: MET Updated Goal: with supervision assistance, pt will complete basic medication management task with > 90% accuracy.    3.  Pt will demonstrate selective attention to basic task for 15 minutes with < 2 redirections.  Baseline: moderate assistance 04/21/2022 Goal status: ONGOING; currently requiring minimal to supervision assistance 06/24/2022 Goal Status: NOT MET Updated Goal: Pt will demonstrate selective attention to basic task for 10 minutes with < 5 redirections.    4.  Pt will complete basic bill paying task with 75% accuracy.  Baseline: total 04/21/2022 Goal status: ONGOING; currently at 50% with minimal assistance 06/24/2022 Goal Status: NOT MET Updated Goal: Pt will use external visual to assess which bills have been paid by her son.  5. With minimal assistance, pt will use strategies to complete semi-complex visuospatial tasks with 85% accuracy.   Baseline: moderate  Goal status: INITIAL  04/21/2022  Goal Status: ONGOING  06/21/2022  Goal Status: MET  6. Pt will require < 2 cues to initiate leaving session in 5 out of 6 opportunities.   Baseline: 6 cues  Goal status: INITIAL 04/21/2022 Goal Status: ONGOING 06/21/2022 Goal Status: ONGOING     LONG TERM GOALS: Target date: 08/21/2021 With rare min A, pt will organize her medicines accurately in a pill organizer.  Baseline: Total Goal  status: ONGOING   2.  With rare min A, pt will demonstrate selective attention to task for 30 minutes.  Baseline: moderate Goal status: ONGOING   3.  With rare min A, pt will sequence basic meal prep activity.  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 Specifically, pt presents with severe impairments in memory (recall of facts, procedures and past & future events), insight & judgement (understanding one's own limitations & what they mean), problem solving (finding solutions to obstacles), processing speed (quick thinking & understanding), oragnization (arranging ideas in a useful order), reasoning (logically thinking through situaitons) and executive functioning (making a plan, acting it out, evaluating success & adjusting).    OBJECTIVE IMPAIRMENTS include attention and executive functioning. These impairments are limiting patient from managing medications, managing appointments, managing finances, household responsibilities, and ADLs/IADLs. Factors affecting potential to achieve goals and functional outcome are ability to learn/carryover information. Patient will benefit from skilled SLP services to address above impairments and improve overall function.  REHAB POTENTIAL: Good  PLAN: SLP FREQUENCY: 1-2x/week  SLP DURATION: 12 weeks  PLANNED INTERVENTIONS: Functional tasks, SLP instruction and feedback, and Patient/family education   Macarena Langseth B. Rutherford Nail, M.S., CCC-SLP, Mining engineer Certified Brain Injury [place]  [place] Office [phone] Ascom [phone] Fax [phone]

## 2022-07-22 NOTE — Therapy (Signed)
Occupational Therapy Treatment Note  Patient Name: Mariyana Fulop MRN: [id number] DOB:[birth year], 77 y.o., female Today's Date: 03/14/2022  PCP: [name], MD REFERRING PROVIDER: [name], NP    OT End of Session - 07/22/22 1215     Visit Number 27    Number of Visits 48    Date for OT Re-Evaluation 08/19/22    Authorization Type Progress reporting period starting 06/19/2022    OT Start Time 1145    OT Stop Time 1230    OT Time Calculation (min) 45 min    Activity Tolerance Patient tolerated treatment well    Behavior During Therapy [facility] for tasks assessed/performed                           Past Medical History:  Diagnosis Date   Abnormal levels of other serum enzymes 07/17/2015   Arthritis    Constipation 07/11/2015   COVID-19 03/06/2021   Diabetes mellitus without complication ([facility])    Elevated liver enzymes    Fatty liver    Generalized abdominal pain 07/11/2015   Hyperlipidemia    Hypertension    Lifelong obesity    Macular degeneration    Morbid obesity due to excess calories ([facility]) 07/11/2015   Other fatigue 07/11/2015   Sleep apnea    Spinal headache    Past Surgical History:  Procedure Laterality Date   ABDOMINAL HYSTERECTOMY     APPENDECTOMY     CATARACT EXTRACTION     CERVICAL LAMINECTOMY  07/21/1985   CESAREAN SECTION     COLONOSCOPY  07/21/2012   diverticulosis, [facility] Dr. [name]    COLONOSCOPY WITH PROPOFOL N/A 02/04/2019   Procedure: COLONOSCOPY WITH PROPOFOL;  Surgeon: [name], MD;  Location: [facility] ENDOSCOPY;  Service: Endoscopy;  Laterality: N/A;   COLONOSCOPY WITH PROPOFOL N/A 03/18/2021   Procedure: COLONOSCOPY WITH PROPOFOL;  Surgeon: [name], MD;  Location: [facility] ENDOSCOPY;  Service: Endoscopy;  Laterality: N/A;  DM   DIAGNOSTIC LAPAROSCOPY     ESOPHAGOGASTRODUODENOSCOPY (EGD) WITH PROPOFOL N/A 02/04/2019   Procedure: ESOPHAGOGASTRODUODENOSCOPY (EGD) WITH PROPOFOL;  Surgeon: [name], MD;  Location: [facility] ENDOSCOPY;  Service: Endoscopy;  Laterality: N/A;   ESOPHAGOGASTRODUODENOSCOPY (EGD) WITH PROPOFOL N/A 03/18/2021   Procedure: ESOPHAGOGASTRODUODENOSCOPY (EGD) WITH PROPOFOL;  Surgeon: [name], MD;  Location: [facility] ENDOSCOPY;  Service: Endoscopy;  Laterality: N/A;   LOOP RECORDER INSERTION N/A 08/05/2021   Procedure: LOOP RECORDER INSERTION;  Surgeon: [name], MD;  Location: [facility] INVASIVE CV LAB;  Service: Cardiovascular;  Laterality: N/A;   SPINE SURGERY     TUBAL LIGATION     Patient Active Problem List   Diagnosis Date Noted   Cerebral edema ([facility]) 08/07/2021   OSA (obstructive sleep apnea) 08/07/2021   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   CVA (cerebral vascular accident) ([facility]) 08/01/2021   GERD (gastroesophageal reflux disease) 07/20/2019   Advanced care planning/counseling discussion 12/17/2016   Skin lesions, generalized 11/13/2016   Chronic right hip pain 06/17/2016   Vitamin D deficiency 09/26/2015   Diastasis recti 08/08/2015   Elevated serum GGT level 07/24/2015   Essential hypertension 07/17/2015   Fatty liver 07/17/2015   Elevated alkaline phosphatase level 07/17/2015   Elevated serum glutamic pyruvic transaminase (SGPT) level 07/17/2015   Abnormal finding on EKG 07/11/2015   Morbid obesity ([facility]) 07/11/2015   Colon, diverticulosis 07/11/2015   Abdominal wall hernia 07/11/2015  DM (diabetes mellitus), type 2 with neurological complications ([facility])    Hyperlipidemia     ONSET DATE: 08/01/2021  REFERRING DIAG: CVA  THERAPY DIAG:  Muscle weakness (generalized)  Rationale for Evaluation and Treatment Rehabilitation  SUBJECTIVE:   SUBJECTIVE STATEMENT:   Pt. reports that she had a nice time for New Years in [place], [place].  Pt accompanied by: self  PERTINENT HISTORY:  Pt. is a 77 y.o. female who presents with a diagnosis of RMCA CVA infarction with hemorrhagic transformation. Pt. attended inpatient  rehab from 08/07/2021-08/22/2021. Pt. received Home health therapy services this past spring.  Pt. has recently had an assessment through driver rehabilitation services at [facility] with recommendations for referrals for  outpatient OT/PT, and ST services. Pt. PMHx includes: HTN, Hyperlipidemia, Macular degeneration, DM, and Obesity.  PRECAUTIONS: None  WEIGHT BEARING RESTRICTIONS No  PAIN:  Are you having pain? 2/10 left shoulder pain.    FALLS: Has patient fallen in last 6 months? Yes.  LIVING ENVIRONMENT: Lives with: lives with their family  Son  [name] Lives in: House/apartment Main living are on one floor Stairs:  2 steps to enter Has following equipment at home: Single point cane, Wheelchair (manual), shower chair, Grab bars, bed rail, and rubber mat.  PLOF: Independent  PATIENT GOALS: To be able to Drive, and cook again  OBJECTIVE:   HAND DOMINANCE: Right   ADLs: Overall ADLs:  Transfers/ambulation related to ADLs: Independent Eating: Independent Grooming: Pt. Fatigues with sustained BUE's in elevation for haircare. Especially with blow drying hair. UB Dressing: Independent pullover shirt, independent now with fastening a bra LB Dressing: Independent donning pants socks, and slide on shoes Toileting: Independent Bathing: Independent Tub Shower transfers: Walk-in shower Independent now   IADLs: Shopping: Needs to be accompanied to the grocery store. Light housekeeping: Does own laundry, laundry,  Meal Prep:  Difficulty with cooking Community mobility: Relies on son, and friend Medication management: Son sets up Mining engineer management: Manages monthly bills independently. Handwriting: No change from baseline  MOBILITY STATUS: Hx of falls out of bed   ACTIVITY TOLERANCE: Activity tolerance:  10-20 min. Before rest break  FUNCTIONAL OUTCOME MEASURES: FOTO: 57  TR score: 62  UPPER EXTREMITY ROM     Active ROM Right eval Left eval Left 05/06/2022  Left 05/27/2022 Left 06/19/2022   Shoulder flexion WFL 96(110) 107(110) 109(120) 112(126)  Shoulder abduction WFL 82(105) 86(105) 92(115) 94(115)  Shoulder adduction       Shoulder extension       Shoulder internal rotation       Shoulder external rotation       Elbow flexion [facility] WFL  Elbow extension [facility] WFL  Wrist flexion WFL 44(60) 68(68) WFL WFL  Wrist extension WFL 32(60) 66(66) WFL WFL  Wrist ulnar deviation       Wrist radial deviation       Wrist pronation       Wrist supination       (Blank rows = not tested)   UPPER EXTREMITY MMT:     MMT Right eval Left eval Left 05/06/2022 Left 05/27/2022 Left 06/19/2022  Shoulder flexion 4+/5 3-/5 3+/5 3+/5 3+/5  Shoulder abduction 4+/5 3-/5 3-/5 3+/5 3+/5  Shoulder adduction       Shoulder extension       Shoulder internal rotation       Shoulder external rotation       Middle trapezius       Lower  trapezius       Elbow flexion 4+/5 4-/5 4+/5 5/5 5/5  Elbow extension 4+/5 4-/5 4+/5 5/5 5/5  Wrist flexion 4+/5 3-/5 4/5 4+/5 4+/5  Wrist extension 4+/5 3/5 4/5 4+/5 4+/5  Wrist ulnar deviation       Wrist radial deviation       Wrist pronation       Wrist supination       (Blank rows = not tested)  HAND FUNCTION: Grip strength: Right: 30 lbs; Left: 13 lbs, Lateral pinch: Right: 15 lbs, Left: 8 lbs, and 3 point pinch: Right: 13 lbs, Left: 7 lbs  05/06/2022 Grip strength: Right: 30 lbs; Left: 14 lbs, Lateral pinch: Right: 15 lbs, Left: 10 lbs, and 3 point pinch: Right: 13 lbs, Left: 7 lbs  05/27/2022:  Grip strength: Right: 30 lbs; Left: 17 lbs, Lateral pinch: Right: 15 lbs, Left: 11 lbs, and 3 point pinch: Right: 13 lbs, Left: 7 lbs  06/19/2022:  Grip strength: Right: 30 lbs; Left: 18 lbs, Lateral pinch: Right: 15 lbs, Left:  10 lbs, and 3 point pinch: Right: 13 lbs, Left: 8 lbs   COORDINATION:  9 Hole Peg test: Right: 26  sec; Left: 1 min. & 9 sec  05/06/2022 9 Hole Peg test: Right: 26   sec; Left: 1 min.  05/27/2022  9 Hole Peg test: Right: 26  sec; Left: 1 min. 4 sec.   06/19/2022  9 Hole Peg test: Right: 26  sec; Left: 1 min.   SENSATION: Light touch: WFL Proprioception: WFL  COGNITION: Overall cognitive status: Within functional limits for tasks assessed  VISION: Subjective report: Glasses. Just received a new prescription to increase bifocal strength Baseline vision: Macular Degeneration Visual history: macular degeneration  VISION ASSESSMENT: To be further assessed in functional context  Left sided Awareness    PERCEPTION: Limited left sided awareness   TODAY'S TREATMENT:   Therapeutic Ex.  Pt. worked on Left UE ROM scapular elevation depression, abduction/rotation, shoulder flexion, and abduction. Pt. worked on left shoulder flexion/Abduction AAROM at the tabletop. Pt. worked on reaching with the LUE to place the shapes progressively through the first 2 increasing heights on the shape tower. Pt. required support proximally at the elbow when reaching.  Neuromuscular re-education:  Pt. worked on left hand [facility] skills grasping 1/2" circular tipped pegs. Pt. worked on Comptroller moving the objects from her palm to the tip of her 2nd digit and thumb in preparation for placing them onto the flat pegboard placed at the tabletop surface.   PATIENT EDUCATION: Education details:  Simulated  Person educated: Patient Education method: verbal cues Education comprehension: verbalized understanding and pt does not plan to cook unless son is present.   HOME EXERCISE PROGRAM:  Continue with an ongoing assessment of HEP needs    GOALS: Goals reviewed with patient? Yes   SHORT TERM GOALS: Target date:  04/15/2022      Pt. Will improve FOTO score by 2 points to reflect improved pt. perceived functional performance Baseline: FOTO: 57, TR score: 62 10th visit: FOTO: 65, 20th visit: FOTO 62, TR score: 62 Goal status: Achieved  LONG TERM GOALS:  Target date: 08/19/2022    Pt. Will improve left UE strength by 2 mm grades to assist with repositioning her grandson on her lap. Baseline: Eval: left shoulder flexion: 3-/5, abduction 3-/5,  elbow flex/ext. 4-/5, wrist extension 3/5, wrist flexion 3-/5  10th visit: left shoulder flexion: 3+/5, abduction 3-+5,  elbow flex/ext. 4+/5, wrist extension  4/5, wrist flexion 4/5 05/27/2022: left shoulder flexion: 3+/5, abduction 3+/5,  elbow flex/ext. 5/5, wrist extension 4+/5, wrist flexion 4+/5  20th visit: left shoulder flexion: 3+/5, abduction 3+/5,  elbow flex/ext. 5/5, wrist extension 4+/5, wrist flexion 4+/5. Pt. Can now hold her grandson grandson on her lap, and reposition him. Pt. Is unable to hold him in standing. Goal status: Ongoing  2.  Pt. will improve left grip strength by 5# to be able to independently hold and use a blow dryer, and brush for hair care. Baseline: Eval: Left grip strength 13#. Pt. Has difficulty performing hair care, holding and using a brush, and blow dryer. 10th visit: Left grip 14#. Pt. Is now able to hold th e blow dryer for the sides, and top of head, not the back. 11/07: Left 17# Pt. Has difficulty blow drying then back of her head.20th visit: Left grip 18#. Pt. Is able to independently hold a brush, and blow dry her hair.  Goal status: Achieved  3.  Pt. Will improve left lateral pinch strength by 3# to be able to independently cut meat. Baseline: Eval: Left 8, Pt. Has difficulty cutting meat. 10th: Left: 10#.  Pt. Is able to cut tender meat, however is not as efficient 11/07: Left 11#. Pt. Is independent cutting meat with increased time. 20th visit: Left: 10#. Pt. Has difficulty positioning, and stabilizing the utensils while cutting meat. Goal status:  Achieved  4.  Pt. Will improve Left hand [facility] skills to be able to be able to independently, and efficiently manipulate small objects during ADL tasks. Baseline: Eval: left: 1 min. 9 sec. 10th visit: Left: 1 min. Pt.is  improving with manipulating small objects. 11/07: Left: 1 min. & 4 sec. Pt. has difficulty manipulating small objects. 20th visit: Left:  1 min. Pt. Is improving with grasping pills, however continues to have difficulty. Goal status: Ongoing   5.  Pt. Will perform light meal preparation with modified independence Baseline: Eval: Pt. reports having difficulty performing light  meal preparation, and cooking tasks. 10th visit: Pt. Is able to make toast in the morning, a sandwich,  use the microwave, make coffee, and pour herself a drink. 11/07: Pt. has difficulty holding a coffee pot when filling the reservoir 20th visit: Pt. Is making waffles in the toaster, heats items in in the microwave, Pt. Uses the oven when supervised.  Goal status: Ongoing  CLINICAL IMPRESSION:  Pt. presented with 2/10 left shoulder pain initially today. Pt. reports that the pain improved with ROM. Pt. tolerated the BUE exercises well today. Pt. required support proximally at the left elbow. Pt. required verbal, and visual cues for technique when performing translatory movements with the 1/2" pegs. Pt. continues to work on improving, and maximizing overall independence with ADLs, and IADL tasks.        PERFORMANCE DEFICITS in functional skills including ADLs, IADLs, coordination, dexterity, sensation, ROM, strength, FMC, decreased knowledge of use of DME, vision, and UE functional use, cognitive skills including attention, and psychosocial skills including coping strategies, environmental adaptation, habits, and routines and behaviors.   IMPAIRMENTS are limiting patient from ADLs, IADLs, and social participation.   COMORBIDITIES may have co-morbidities  that affects occupational performance. Patient will benefit from skilled OT to address above impairments and improve overall function.  MODIFICATION OR ASSISTANCE TO COMPLETE EVALUATION: Min-Moderate modification of tasks or assist with assess necessary to complete an  evaluation.  OT OCCUPATIONAL PROFILE AND HISTORY: Detailed assessment: Review of records and additional review of physical,  cognitive, psychosocial history related to current functional performance.  CLINICAL DECISION MAKING: Moderate - several treatment options, min-mod task modification necessary  REHAB POTENTIAL: Good  EVALUATION COMPLEXITY: Moderate    PLAN: OT FREQUENCY: 2x/week  OT DURATION: 12 weeks  PLANNED INTERVENTIONS: self care/ADL training, therapeutic exercise, therapeutic activity, neuromuscular re-education, manual therapy, passive range of motion, and paraffin  RECOMMENDED OTHER SERVICES: ST, and PT  CONSULTED AND AGREED WITH PLAN OF CARE: Patient  PLAN FOR NEXT SESSION:  L hand strengthening and coordination exercises    Harrel Carina, MS, OTR/L

## 2022-07-23 NOTE — Therapy (Addendum)
OUTPATIENT PHYSICAL THERAPY NEURO TREATMENT    Patient Name: Alyssa Mayo MRN: [id number] DOB:06-04-1946, 77 y.o., female Today's Date: 07/31/2022   PCP: [place] Pink, MD REFERRING PROVIDER: Frann Rider, NP    PT End of Session - 07/24/22 1106    Visit Number 25   Number of Visits 75    Date for PT Re-Evaluation 09/16/22    Authorization Type [employer]    Authorization Time Period 04/02/2022-06/25/2022; 06/24/2022-09/16/2022    PT Start Time 1103   PT Stop Time 2229    PT Time Calculation (min) 42 min               Past Medical History:  Diagnosis Date   Abnormal levels of other serum enzymes 07/17/2015   Arthritis    Constipation 07/11/2015   COVID-19 03/06/2021   Diabetes mellitus without complication ([place])    Elevated liver enzymes    Fatty liver    Generalized abdominal pain 07/11/2015   Hyperlipidemia    Hypertension    Lifelong obesity    Macular degeneration    Morbid obesity due to excess calories ([place]) 07/11/2015   Other fatigue 07/11/2015   Sleep apnea    Spinal headache    Past Surgical History:  Procedure Laterality Date   ABDOMINAL HYSTERECTOMY     APPENDECTOMY     CATARACT EXTRACTION     CERVICAL LAMINECTOMY  07/21/1985   CESAREAN SECTION     COLONOSCOPY  07/21/2012   diverticulosis, [facility] Dr. Donnella Sham    COLONOSCOPY WITH PROPOFOL N/A 02/04/2019   Procedure: COLONOSCOPY WITH PROPOFOL;  Surgeon: Lollie Sails, MD;  Location: [facility] ENDOSCOPY;  Service: Endoscopy;  Laterality: N/A;   COLONOSCOPY WITH PROPOFOL N/A 03/18/2021   Procedure: COLONOSCOPY WITH PROPOFOL;  Surgeon: Lesly Rubenstein, MD;  Location: [facility] ENDOSCOPY;  Service: Endoscopy;  Laterality: N/A;  DM   DIAGNOSTIC LAPAROSCOPY     ESOPHAGOGASTRODUODENOSCOPY (EGD) WITH PROPOFOL N/A 02/04/2019   Procedure: ESOPHAGOGASTRODUODENOSCOPY (EGD) WITH PROPOFOL;  Surgeon: Lollie Sails, MD;  Location: [facility] ENDOSCOPY;  Service: Endoscopy;  Laterality: N/A;    ESOPHAGOGASTRODUODENOSCOPY (EGD) WITH PROPOFOL N/A 03/18/2021   Procedure: ESOPHAGOGASTRODUODENOSCOPY (EGD) WITH PROPOFOL;  Surgeon: Lesly Rubenstein, MD;  Location: [facility] ENDOSCOPY;  Service: Endoscopy;  Laterality: N/A;   LOOP RECORDER INSERTION N/A 08/05/2021   Procedure: LOOP RECORDER INSERTION;  Surgeon: Evans Lance, MD;  Location: [place] CV LAB;  Service: Cardiovascular;  Laterality: N/A;   SPINE SURGERY     TUBAL LIGATION     Patient Active Problem List   Diagnosis Date Noted   Cerebral edema ([place]) 08/07/2021   OSA (obstructive sleep apnea) 08/07/2021   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   CVA (cerebral vascular accident) ([place]) 08/01/2021   GERD (gastroesophageal reflux disease) 07/20/2019   Advanced care planning/counseling discussion 12/17/2016   Skin lesions, generalized 11/13/2016   Chronic right hip pain 06/17/2016   Vitamin D deficiency 09/26/2015   Diastasis recti 08/08/2015   Elevated serum GGT level 07/24/2015   Essential hypertension 07/17/2015   Fatty liver 07/17/2015   Elevated alkaline phosphatase level 07/17/2015   Elevated serum glutamic pyruvic transaminase (SGPT) level 07/17/2015   Abnormal finding on EKG 07/11/2015   Morbid obesity ([place]) 07/11/2015   Colon, diverticulosis 07/11/2015   Abdominal wall hernia 07/11/2015   DM (diabetes mellitus), type 2 with neurological complications [facility])    Hyperlipidemia     ONSET DATE: January 11th 2023  REFERRING DIAG:  N98.921,J94.1 (ICD-10-CM) - Gait  disturbance, post-stroke  I63.511 (ICD-10-CM) - Right middle cerebral artery stroke (HCC)    THERAPY DIAG:  Cognitive communication deficit  Right middle cerebral artery stroke (HCC)  Muscle weakness (generalized)  Other lack of coordination  Difficulty in walking, not elsewhere classified  Unsteadiness on feet  Apraxia  Rationale for Evaluation and Treatment Rehabilitation  SUBJECTIVE:  Pt comes to therapy tearful and  labile.  Pt notes that she is upset due to not doing well on her testing with SLP and having to come to accept her new normal.  Pt is concerned because SLP told her that she would not be able to drive and she has a hard time accepting that.  Pt notes that she has knows what her old normal was like and is having a harder time letting go of that.   Pt accompanied by: self; [name] her son also accompanying pt.   PERTINENT HISTORY: Per chart and confirmed by pt:  Pt is a 77 yo female with RMCA CVA infarction with hemorrhagic transformation 08/01/2021, with inpatient rehab 08/07/2021-08/22/2021 followed by [facility] therapy until June. Pt now currently being seen for outpatient OT and ST services. Pt reports limitations in stamina persist. Other PMH is significant for HTN, HLD, macular degeneration, DM, obesity, sleep apnea, arthritis, chronic R hip pain, vitamin D deficiency, diastasis recti, abdominal wall hernia, hx of abdominal hysterectomy, appendectomy, spine surgery (years ago).   PAIN:  Are you having pain?  0/10 Pain Location:     TODAY'S TREATMENT:  BP taken upon entering into the clinic BP: 138/61 HR: 79   MHP applied to lower back after ambulation attempt due to pain in the lumbar spine and R gluteal region  TherEx:  STS with Airex pad on floor to increase hip flexion for increased challenge, 2x10  Hooklying bridges, 2x10 Hooklying SLR with 3# AW donned  Hooklying marches with TrA activation and holds Hooklying clamshells with BTB, 2x10 each LE  Ambulation to cancer center (548 ft) and back with 3# AW donned bilaterally,     PATIENT EDUCATION: Education details: exam findings, indications, plan, HEP Person educated: Patient Education method: Consulting civil engineer, Demonstration, Verbal cues, and Handouts Education comprehension: verbalized understanding, returned demonstration, verbal cues required, and needs further education   HOME EXERCISE PROGRAM:  Access Code: [email or web address] URL:  [email or web address] Date: 06/02/2022 Prepared by: [name]  Exercises - Standing Single Leg Stance with Counter Support  - 2 x daily - 7 x weekly - 1 sets - 10 reps - 10s  hold - Standing Marching  - 2 x daily - 7 x weekly - 2 sets - 20 reps - Side Stepping with Counter Support  - 2 x daily - 7 x weekly - 2 sets - 20 reps - Walking  - 2 x daily - 7 x weekly    GOALS:  Goals reviewed with patient? Yes  SHORT TERM GOALS: Target date: 09/11/2022  Patient will be independent in home exercise program to improve strength/mobility for better functional independence with ADLs. Baseline: initiated; Patient reports walking but not always independent with progressive HEP. Will keep goal active to anticipate more balance ex for HEP.  Goal status: PROGRESSING   LONG TERM GOALS: Target date: 10/23/2022  Patient will increase FOTO score to equal to or greater than 65  to demonstrate improvement in mobility and quality of life.  Baseline: 57 06/24/2022= 61 Goal status: IN PROGRESS  2.  Patient (> 36 years old) will complete five times sit to stand test in <  15 seconds indicating an increased LE strength and improved balance. Baseline: 17 sec hands-free 05/21/2022 - 15.36 sec 06/24/2022 - 13.56 sec  Goal status: GOAL MET  3.  Patient will increase 10 meter walk test to >1.49ms as to improve gait speed for better community ambulation and to reduce fall risk. Baseline: 0.66 m/s; 11/1:  0.85 m/s 06/24/2022= 0.97 m/s without an AD Goal status: IN PROGRESS  4.  Patient will increase Berg Balance score by > 3 points to demonstrate decreased fall risk during functional activities. Baseline: 50/56 06/24/2022= 52/56 Goal status: IN PROGRESS  5.  Patient will increase six minute walk test distance to >1000 for progression to community ambulator and improve gait ability Baseline: 04/10/22: 671f(pain onset at 5 minutes) 05/21/22: 1027 ft 06/23/2022= 940 feet - will keep goal active  until patient able to consistently achieve > 1000 feet Goal status: IN PROGRESS    ASSESSMENT:  CLINICAL IMPRESSION:  Pt put forth good effort throughout the session today following the initial labile start to the session.  Pt is saddened by the decreased likelihood of returning to driving at this time or in the future.  Pt and son noted the pt to have some difficulty ambulating longer hallway when visiting her brother in the nursing home, so endurance training was utilized today during session and pt responded well with increased balance compared to visitation with her brother.  Pt did endorse having some increased back pain upon completion of the increased distance, so moist heat pack was applied to back while performing bed-level exercises.   Pt will continue to benefit from skilled therapy to address remaining deficits in order to improve overall QoL and return to PLOF.      OBJECTIVE IMPAIRMENTS Abnormal gait, decreased activity tolerance, decreased balance, decreased coordination, decreased endurance, decreased mobility, difficulty walking, decreased strength, impaired sensation, impaired UE functional use, improper body mechanics, postural dysfunction, and pain.   ACTIVITY LIMITATIONS carrying, lifting, bending, standing, squatting, stairs, bed mobility, bathing, reach over head, hygiene/grooming, and locomotion level  PARTICIPATION LIMITATIONS: meal prep, cleaning, laundry, medication management, personal finances, driving, shopping, community activity, and yard work  PERSONAL FACTORS Age, Sex, Time since onset of injury/illness/exacerbation, and 3+ comorbidities: Other PMH is significant for HTN, HLD, macular degeneration, DM, obesity, sleep apnea, arthritis, chronic R hip pain, vitamin D deficiency, diastasis recti, abdominal wall hernia, hx of abdominal hysterectomy, appendectomy, spine surgery (years ago).   are also affecting patient's functional outcome.   REHAB POTENTIAL:  Good  CLINICAL DECISION MAKING: Evolving/moderate complexity  EVALUATION COMPLEXITY: Moderate  PLAN: PT FREQUENCY: 2x/week  PT DURATION: 12 weeks  PLANNED INTERVENTIONS: Therapeutic exercises, Therapeutic activity, Neuromuscular re-education, Balance training, Gait training, Patient/Family education, Self Care, Joint mobilization, Joint manipulation, Stair training, Vestibular training, Canalith repositioning, Orthotic/Fit training, DME instructions, Dry Needling, Electrical stimulation, Wheelchair mobility training, Spinal mobilization, Cryotherapy, Moist heat, Splintting, Taping, Traction, Ultrasound, Biofeedback, Manual therapy, and Re-evaluation  PLAN FOR NEXT SESSION:   LE strengthening: leg press/squats/etc. HEP compliance and introduce more cognitive related tasks.   JoGwenlyn SaranPT, DPT Physical Therapist- [facility] [place]01/11/24, 11:15 AM

## 2022-07-24 ENCOUNTER — Ambulatory Visit: Payer: Medicare HMO

## 2022-07-24 ENCOUNTER — Ambulatory Visit: Payer: Medicare HMO | Admitting: Speech Pathology

## 2022-07-24 ENCOUNTER — Ambulatory Visit: Payer: Medicare HMO | Admitting: Occupational Therapy

## 2022-07-24 DIAGNOSIS — R482 Apraxia: Secondary | ICD-10-CM

## 2022-07-24 DIAGNOSIS — R41841 Cognitive communication deficit: Secondary | ICD-10-CM | POA: Diagnosis not present

## 2022-07-24 DIAGNOSIS — M6281 Muscle weakness (generalized): Secondary | ICD-10-CM

## 2022-07-24 DIAGNOSIS — R2681 Unsteadiness on feet: Secondary | ICD-10-CM

## 2022-07-24 DIAGNOSIS — I63511 Cerebral infarction due to unspecified occlusion or stenosis of right middle cerebral artery: Secondary | ICD-10-CM

## 2022-07-24 DIAGNOSIS — R262 Difficulty in walking, not elsewhere classified: Secondary | ICD-10-CM

## 2022-07-24 DIAGNOSIS — R278 Other lack of coordination: Secondary | ICD-10-CM

## 2022-07-24 NOTE — Therapy (Signed)
Occupational Therapy Treatment Note  Patient Name: Alyssa Mayo MRN: [id number] DOB:[birth year], 77 y.o., female Today's Date: 03/14/2022  PCP: Alyssa Mina, MD REFERRING PROVIDER: [name], NP    OT End of Session - 07/24/22 2209     Visit Number 28    Number of Visits 48    Date for OT Re-Evaluation 08/19/22    Authorization Type Progress reporting period starting 06/19/2022    OT Start Time 1145    OT Stop Time 1230    OT Time Calculation (min) 45 min    Activity Tolerance Patient tolerated treatment well    Behavior During Therapy [facility] for tasks assessed/performed                           Past Medical History:  Diagnosis Date   Abnormal levels of other serum enzymes 07/17/2015   Arthritis    Constipation 07/11/2015   COVID-19 03/06/2021   Diabetes mellitus without complication (HCC)    Elevated liver enzymes    Fatty liver    Generalized abdominal pain 07/11/2015   Hyperlipidemia    Hypertension    Lifelong obesity    Macular degeneration    Morbid obesity due to excess calories (HCC) 07/11/2015   Other fatigue 07/11/2015   Sleep apnea    Spinal headache    Past Surgical History:  Procedure Laterality Date   ABDOMINAL HYSTERECTOMY     APPENDECTOMY     CATARACT EXTRACTION     CERVICAL LAMINECTOMY  07/21/1985   CESAREAN SECTION     COLONOSCOPY  07/21/2012   diverticulosis, [facility] Dr. Ricki Mayo    COLONOSCOPY WITH PROPOFOL N/A 02/04/2019   Procedure: COLONOSCOPY WITH PROPOFOL;  Surgeon: Alyssa Deem, MD;  Location: [facility] ENDOSCOPY;  Service: Endoscopy;  Laterality: N/A;   COLONOSCOPY WITH PROPOFOL N/A 03/18/2021   Procedure: COLONOSCOPY WITH PROPOFOL;  Surgeon: Alyssa Bill, MD;  Location: [facility] ENDOSCOPY;  Service: Endoscopy;  Laterality: N/A;  DM   DIAGNOSTIC LAPAROSCOPY     ESOPHAGOGASTRODUODENOSCOPY (EGD) WITH PROPOFOL N/A 02/04/2019   Procedure: ESOPHAGOGASTRODUODENOSCOPY (EGD) WITH PROPOFOL;  Surgeon: Alyssa Deem, MD;  Location: [facility] ENDOSCOPY;  Service: Endoscopy;  Laterality: N/A;   ESOPHAGOGASTRODUODENOSCOPY (EGD) WITH PROPOFOL N/A 03/18/2021   Procedure: ESOPHAGOGASTRODUODENOSCOPY (EGD) WITH PROPOFOL;  Surgeon: Alyssa Bill, MD;  Location: [facility] ENDOSCOPY;  Service: Endoscopy;  Laterality: N/A;   LOOP RECORDER INSERTION N/A 08/05/2021   Procedure: LOOP RECORDER INSERTION;  Surgeon: Alyssa Maw, MD;  Location: [facility] INVASIVE CV LAB;  Service: Cardiovascular;  Laterality: N/A;   SPINE SURGERY     TUBAL LIGATION     Patient Active Problem List   Diagnosis Date Noted   Cerebral edema (HCC) 08/07/2021   OSA (obstructive sleep apnea) 08/07/2021   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   CVA (cerebral vascular accident) (HCC) 08/01/2021   GERD (gastroesophageal reflux disease) 07/20/2019   Advanced care planning/counseling discussion 12/17/2016   Skin lesions, generalized 11/13/2016   Chronic right hip pain 06/17/2016   Vitamin D deficiency 09/26/2015   Diastasis recti 08/08/2015   Elevated serum GGT level 07/24/2015   Essential hypertension 07/17/2015   Fatty liver 07/17/2015   Elevated alkaline phosphatase level 07/17/2015   Elevated serum glutamic pyruvic transaminase (SGPT) level 07/17/2015   Abnormal finding on EKG 07/11/2015   Morbid obesity (HCC) 07/11/2015   Colon, diverticulosis 07/11/2015   Abdominal wall hernia 07/11/2015  DM (diabetes mellitus), type 2 with neurological complications ([place])    Hyperlipidemia     ONSET DATE: 08/01/2021  REFERRING DIAG: CVA  THERAPY DIAG:  Muscle weakness (generalized)  Other lack of coordination  Rationale for Evaluation and Treatment Rehabilitation  SUBJECTIVE:   SUBJECTIVE STATEMENT:   Pt. reports that she had a nice time for New Years in [place], [place].  Pt accompanied by: self  PERTINENT HISTORY:  Pt. is a 77 y.o. female who presents with a diagnosis of RMCA CVA infarction with hemorrhagic  transformation. Pt. attended inpatient rehab from 08/07/2021-08/22/2021. Pt. received Home health therapy services this past spring.  Pt. has recently had an assessment through driver rehabilitation services at [facility] with recommendations for referrals for  outpatient OT/PT, and ST services. Pt. PMHx includes: HTN, Hyperlipidemia, Macular degeneration, DM, and Obesity.  PRECAUTIONS: None  WEIGHT BEARING RESTRICTIONS No  PAIN:  Are you having pain? No. tightness and limited ROM   FALLS: Has patient fallen in last 6 months? Yes.  LIVING ENVIRONMENT: Lives with: lives with their family  Son  Alyssa Mayo Lives in: House/apartment Main living are on one floor Stairs:  2 steps to enter Has following equipment at home: Single point cane, Wheelchair (manual), shower chair, Grab bars, bed rail, and rubber mat.  PLOF: Independent  PATIENT GOALS: To be able to Drive, and cook again  OBJECTIVE:   HAND DOMINANCE: Right   ADLs: Overall ADLs:  Transfers/ambulation related to ADLs: Independent Eating: Independent Grooming: Pt. Fatigues with sustained BUE's in elevation for haircare. Especially with blow drying hair. UB Dressing: Independent pullover shirt, independent now with fastening a bra LB Dressing: Independent donning pants socks, and slide on shoes Toileting: Independent Bathing: Independent Tub Shower transfers: Walk-in shower Independent now   IADLs: Shopping: Needs to be accompanied to the grocery store. Light housekeeping: Does own laundry, laundry,  Meal Prep:  Difficulty with cooking Community mobility: Relies on son, and friend Medication management: Son sets up Building surveyor management: Manages monthly bills independently. Handwriting: No change from baseline  MOBILITY STATUS: Hx of falls out of bed   ACTIVITY TOLERANCE: Activity tolerance:  10-20 min. Before rest break  FUNCTIONAL OUTCOME MEASURES: FOTO: 57  TR score: 62  UPPER EXTREMITY ROM     Active ROM  Right eval Left eval Left 05/06/2022 Left 05/27/2022 Left 06/19/2022   Shoulder flexion WFL 96(110) 107(110) 109(120) 112(126)  Shoulder abduction WFL 82(105) 86(105) 92(115) 94(115)  Shoulder adduction       Shoulder extension       Shoulder internal rotation       Shoulder external rotation       Elbow flexion [facility] WFL  Elbow extension [facility] WFL  Wrist flexion WFL 44(60) 68(68) WFL WFL  Wrist extension WFL 32(60) 66(66) WFL WFL  Wrist ulnar deviation       Wrist radial deviation       Wrist pronation       Wrist supination       (Blank rows = not tested)   UPPER EXTREMITY MMT:     MMT Right eval Left eval Left 05/06/2022 Left 05/27/2022 Left 06/19/2022  Shoulder flexion 4+/5 3-/5 3+/5 3+/5 3+/5  Shoulder abduction 4+/5 3-/5 3-/5 3+/5 3+/5  Shoulder adduction       Shoulder extension       Shoulder internal rotation       Shoulder external rotation       Middle trapezius  Lower trapezius       Elbow flexion 4+/5 4-/5 4+/5 5/5 5/5  Elbow extension 4+/5 4-/5 4+/5 5/5 5/5  Wrist flexion 4+/5 3-/5 4/5 4+/5 4+/5  Wrist extension 4+/5 3/5 4/5 4+/5 4+/5  Wrist ulnar deviation       Wrist radial deviation       Wrist pronation       Wrist supination       (Blank rows = not tested)  HAND FUNCTION: Grip strength: Right: 30 lbs; Left: 13 lbs, Lateral pinch: Right: 15 lbs, Left: 8 lbs, and 3 point pinch: Right: 13 lbs, Left: 7 lbs  05/06/2022 Grip strength: Right: 30 lbs; Left: 14 lbs, Lateral pinch: Right: 15 lbs, Left: 10 lbs, and 3 point pinch: Right: 13 lbs, Left: 7 lbs  05/27/2022:  Grip strength: Right: 30 lbs; Left: 17 lbs, Lateral pinch: Right: 15 lbs, Left: 11 lbs, and 3 point pinch: Right: 13 lbs, Left: 7 lbs  06/19/2022:  Grip strength: Right: 30 lbs; Left: 18 lbs, Lateral pinch: Right: 15 lbs, Left:  10 lbs, and 3 point pinch: Right: 13 lbs, Left: 8 lbs   COORDINATION:  9 Hole Peg test: Right: 26  sec; Left: 1 min. & 9  sec  05/06/2022 9 Hole Peg test: Right: 26  sec; Left: 1 min.  05/27/2022  9 Hole Peg test: Right: 26  sec; Left: 1 min. 4 sec.   06/19/2022  9 Hole Peg test: Right: 26  sec; Left: 1 min.   SENSATION: Light touch: WFL Proprioception: WFL  COGNITION: Overall cognitive status: Within functional limits for tasks assessed  VISION: Subjective report: Glasses. Just received a new prescription to increase bifocal strength Baseline vision: Macular Degeneration Visual history: macular degeneration  VISION ASSESSMENT: To be further assessed in functional context  Left sided Awareness    PERCEPTION: Limited left sided awareness   TODAY'S TREATMENT:   Therapeutic Ex.  Pt. worked on Left UE ROM scapular elevation depression, abduction/rotation, shoulder flexion, and abduction. Pt. worked on left shoulder flexion/Abduction AAROM at the tabletop. Pt. Worked on BUE strengthening, and reciprocal motion using the UBE while seated for 8 min. with no resistance. Constant monitoring was provided with occasional support proximally at her left elbow. Pt. worked on reaching with the LUE to place the shapes progressively through the first 2 increasing heights on the shape tower. Pt. required support proximally at the elbow when reaching to the 2nd vertical dowel.  PATIENT EDUCATION: Education details:  Simulated  Person educated: Patient Education method: verbal cues Education comprehension: verbalized understanding and pt does not plan to cook unless son is present.   HOME EXERCISE PROGRAM:  Continue with an ongoing assessment of HEP needs    GOALS: Goals reviewed with patient? Yes   SHORT TERM GOALS: Target date:  04/15/2022      Pt. Will improve FOTO score by 2 points to reflect improved pt. perceived functional performance Baseline: FOTO: 57, TR score: 62 10th visit: FOTO: 65, 20th visit: FOTO 62, TR score: 62 Goal status: Achieved  LONG TERM GOALS: Target date: 08/19/2022     Pt. Will improve left UE strength by 2 mm grades to assist with repositioning her grandson on her lap. Baseline: Eval: left shoulder flexion: 3-/5, abduction 3-/5,  elbow flex/ext. 4-/5, wrist extension 3/5, wrist flexion 3-/5  10th visit: left shoulder flexion: 3+/5, abduction 3-+5,  elbow flex/ext. 4+/5, wrist extension 4/5, wrist flexion 4/5 05/27/2022: left shoulder flexion: 3+/5, abduction 3+/5,  elbow flex/ext.  5/5, wrist extension 4+/5, wrist flexion 4+/5  20th visit: left shoulder flexion: 3+/5, abduction 3+/5,  elbow flex/ext. 5/5, wrist extension 4+/5, wrist flexion 4+/5. Pt. Can now hold her grandson grandson on her lap, and reposition him. Pt. Is unable to hold him in standing. Goal status: Ongoing  2.  Pt. will improve left grip strength by 5# to be able to independently hold and use a blow dryer, and brush for hair care. Baseline: Eval: Left grip strength 13#. Pt. Has difficulty performing hair care, holding and using a brush, and blow dryer. 10th visit: Left grip 14#. Pt. Is now able to hold th e blow dryer for the sides, and top of head, not the back. 11/07: Left 17# Pt. Has difficulty blow drying then back of her head.20th visit: Left grip 18#. Pt. Is able to independently hold a brush, and blow dry her hair.  Goal status: Achieved  3.  Pt. Will improve left lateral pinch strength by 3# to be able to independently cut meat. Baseline: Eval: Left 8, Pt. Has difficulty cutting meat. 10th: Left: 10#.  Pt. Is able to cut tender meat, however is not as efficient 11/07: Left 11#. Pt. Is independent cutting meat with increased time. 20th visit: Left: 10#. Pt. Has difficulty positioning, and stabilizing the utensils while cutting meat. Goal status:  Achieved  4.  Pt. Will improve Left hand [facility] skills to be able to be able to independently, and efficiently manipulate small objects during ADL tasks. Baseline: Eval: left: 1 min. 9 sec. 10th visit: Left: 1 min. Pt.is improving with manipulating  small objects. 11/07: Left: 1 min. & 4 sec. Pt. has difficulty manipulating small objects. 20th visit: Left:  1 min. Pt. Is improving with grasping pills, however continues to have difficulty. Goal status: Ongoing   5.  Pt. Will perform light meal preparation with modified independence Baseline: Eval: Pt. reports having difficulty performing light  meal preparation, and cooking tasks. 10th visit: Pt. Is able to make toast in the morning, a sandwich,  use the microwave, make coffee, and pour herself a drink. 11/07: Pt. has difficulty holding a coffee pot when filling the reservoir 20th visit: Pt. Is making waffles in the toaster, heats items in in the microwave, Pt. Uses the oven when supervised.  Goal status: Ongoing  CLINICAL IMPRESSION:  Pt. reports that she didn't receive good news this morning in speech therapy. Pt. reports that her arm feels better, and looser following ROM, UBE, and reaching tasks. Pt. Continues to require support proximally at the left elbow with the proximal support gradually tapering off.  Pt. Requires cues to avoid compensation proximally with leaning to the right when reaching to the left. Pt. continues to work on improving, and maximizing overall independence with ADLs, and IADL tasks.        PERFORMANCE DEFICITS in functional skills including ADLs, IADLs, coordination, dexterity, sensation, ROM, strength, FMC, decreased knowledge of use of DME, vision, and UE functional use, cognitive skills including attention, and psychosocial skills including coping strategies, environmental adaptation, habits, and routines and behaviors.   IMPAIRMENTS are limiting patient from ADLs, IADLs, and social participation.   COMORBIDITIES may have co-morbidities  that affects occupational performance. Patient will benefit from skilled OT to address above impairments and improve overall function.  MODIFICATION OR ASSISTANCE TO COMPLETE EVALUATION: Min-Moderate modification of tasks or  assist with assess necessary to complete an evaluation.  OT OCCUPATIONAL PROFILE AND HISTORY: Detailed assessment: Review of records and additional review of  physical, cognitive, psychosocial history related to current functional performance.  CLINICAL DECISION MAKING: Moderate - several treatment options, min-mod task modification necessary  REHAB POTENTIAL: Good  EVALUATION COMPLEXITY: Moderate    PLAN: OT FREQUENCY: 2x/week  OT DURATION: 12 weeks  PLANNED INTERVENTIONS: self care/ADL training, therapeutic exercise, therapeutic activity, neuromuscular re-education, manual therapy, passive range of motion, and paraffin  RECOMMENDED OTHER SERVICES: ST, and PT  CONSULTED AND AGREED WITH PLAN OF CARE: Patient  PLAN FOR NEXT SESSION:  L hand strengthening and coordination exercises    Olegario Messier, MS, OTR/L

## 2022-07-28 NOTE — Therapy (Signed)
OUTPATIENT SPEECH LANGUAGE PATHOLOGY TREATMENT NOTE     Patient Name: Renny Gunnarson MRN: [id number] DOB:04/12/1946, 77 y.o., female Today's Date: 07/24/2022  PCP: [place] Pink, MD REFERRING PROVIDER: Frann Rider, NP   End of Session - 07/24/22 0617     Visit Number 23    Number of Visits 27    Date for SLP Re-Evaluation 08/21/22    Authorization Type [employer] Medicare HMO/PPO    Progress Note Due on Visit 30    SLP Start Time 1000    SLP Stop Time  1100    SLP Time Calculation (min) 60 min    Activity Tolerance Patient tolerated treatment well                       Past Medical History:  Diagnosis Date   Abnormal levels of other serum enzymes 07/17/2015   Arthritis    Constipation 07/11/2015   COVID-19 03/06/2021   Diabetes mellitus without complication ([place])    Elevated liver enzymes    Fatty liver    Generalized abdominal pain 07/11/2015   Hyperlipidemia    Hypertension    Lifelong obesity    Macular degeneration    Morbid obesity due to excess calories ([place]) 07/11/2015   Other fatigue 07/11/2015   Sleep apnea    Spinal headache    Past Surgical History:  Procedure Laterality Date   ABDOMINAL HYSTERECTOMY     APPENDECTOMY     CATARACT EXTRACTION     CERVICAL LAMINECTOMY  07/21/1985   CESAREAN SECTION     COLONOSCOPY  07/21/2012   diverticulosis, [facility] Dr. Donnella Sham    COLONOSCOPY WITH PROPOFOL N/A 02/04/2019   Procedure: COLONOSCOPY WITH PROPOFOL;  Surgeon: Lollie Sails, MD;  Location: [facility] ENDOSCOPY;  Service: Endoscopy;  Laterality: N/A;   COLONOSCOPY WITH PROPOFOL N/A 03/18/2021   Procedure: COLONOSCOPY WITH PROPOFOL;  Surgeon: Lesly Rubenstein, MD;  Location: [facility] ENDOSCOPY;  Service: Endoscopy;  Laterality: N/A;  DM   DIAGNOSTIC LAPAROSCOPY     ESOPHAGOGASTRODUODENOSCOPY (EGD) WITH PROPOFOL N/A 02/04/2019   Procedure: ESOPHAGOGASTRODUODENOSCOPY (EGD) WITH PROPOFOL;  Surgeon: Lollie Sails, MD;  Location: [facility] ENDOSCOPY;   Service: Endoscopy;  Laterality: N/A;   ESOPHAGOGASTRODUODENOSCOPY (EGD) WITH PROPOFOL N/A 03/18/2021   Procedure: ESOPHAGOGASTRODUODENOSCOPY (EGD) WITH PROPOFOL;  Surgeon: Lesly Rubenstein, MD;  Location: [facility] ENDOSCOPY;  Service: Endoscopy;  Laterality: N/A;   LOOP RECORDER INSERTION N/A 08/05/2021   Procedure: LOOP RECORDER INSERTION;  Surgeon: Evans Lance, MD;  Location: [place] CV LAB;  Service: Cardiovascular;  Laterality: N/A;   SPINE SURGERY     TUBAL LIGATION     Patient Active Problem List   Diagnosis Date Noted   Cerebral edema ([place]) 08/07/2021   OSA (obstructive sleep apnea) 08/07/2021   Right middle cerebral artery stroke ([place]) 08/07/2021   CVA (cerebral vascular accident) ([place]) 08/01/2021   GERD (gastroesophageal reflux disease) 07/20/2019   Advanced care planning/counseling discussion 12/17/2016   Skin lesions, generalized 11/13/2016   Chronic right hip pain 06/17/2016   Vitamin D deficiency 09/26/2015   Diastasis recti 08/08/2015   Elevated serum GGT level 07/24/2015   Essential hypertension 07/17/2015   Fatty liver 07/17/2015   Elevated alkaline phosphatase level 07/17/2015   Elevated serum glutamic pyruvic transaminase (SGPT) level 07/17/2015   Abnormal finding on EKG 07/11/2015   Morbid obesity ([place]) 07/11/2015   Colon, diverticulosis 07/11/2015   Abdominal wall hernia 07/11/2015   DM (diabetes mellitus), 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 Anniece Bleiler is a 77 y.o. female with history of diabetes, hypertension, hyperlipidemia, obesity, and sleep apnea who presented to [facility] ED on 08/01/2021 after being found down by her son. Her last know well was approximately 07/29/2021. Pt transferred to [facility] for  further CVA management. In addition, pt received ST services in CIR from 08/08/2021 thru 08/21/2021.      DIAGNOSTIC FINDINGS: MRI showed extensive restricted diffusion throughout much of the right MCA territory with associated changes of hemorrhagic transformation and localized cerebral edema and mass effect. Per Dr. Pearlean Brownie, felt embolic pattern secondary to unclear source although concerning for occult A fib. Repeat [facility] 1/14 showed no interval progression of right MCA hemorrhagic infarct with petechial hemorrhage.  CTA head/neck showed right M2 thrombus.  EF 60 to 65%.   THERAPY DIAG:  Cognitive communication deficit  Right middle cerebral artery stroke [facility])  Rationale for Evaluation and Treatment Rehabilitation  SUBJECTIVE: pt became emotional during session, accompanied by her son Pt accompanied by: self, accompanied by her son  PAIN:  Are you having pain? No  PATIENT GOALS to be able to drive again and live independently  OBJECTIVE:   TODAY'S TREATMENT: Skilled treatment session focused on pt's cognitive communication goals. SLP facilitated the session by providing the following interventions:  Received email from son asking questions about pt's ability to drive     Pt's son was present during today's session. Pt has made slower than expected gain over the course of ST services and we are coming up on the year anniversary of pt's CVA. In light of this, SLP collaborated with pt and her son on what pt and son's current goals are. Pt states her goal is to drive again. Son provided that he would like for pt to be independent enough to live alone. SLP provided education that pt is likely at her new baseline at this time and driving or living independently are no longer functional goals to target. While pt was emotional, she voiced understanding and knew this might be the case.    In an effort to also provide some objective clarity, SLP offered to administer the Trail Making Test.    TRIAL MAKING TEST (TMT) Parts A & B Both parts of the Trail Making Test consist of 25 circles distributed over a sheet of paper. In Part A, the circles are numbered 1 - 25, and the patient should draw lines to connect the numbers in ascending order. In Part B, the circles include both numbers (1 - 13) and letters (A - L); as in Part A, the patient draws lines to connect the circles in an ascending pattern, but with the added task of alternating between the numbers and letters (i.e., 1-A-2-B-3-C, etc.).   Results for both TMT A and B are reported as the number of seconds required to complete the task; therefore, higher scores reveal greater impairment.  Results:  Trail A - DEFICIENT -  62 seconds (n=29 seconds; Deficient > 78 seconds) Trail B - DEFICIENT -  243 seconds (n= 75 seconds; Deficient > 273 seconds) time stopped when pt continued looking for the next letter (M) for > 3 minutes   Pt also completed the Trail Making Test during driving evaluation on 37/85/8850. At  that time pt's scores were: Trail A: 43 Trail B: 136  It appears that pt's scores have worsened since August but it could also be contributed to her emotional state during this session. Of note, pt struggled with starting at the number 1 start- she started at the letter "l" and while she finished at the number 13, she didn't read "end" and continued searching for the next letter that was not present on the assessment.   PATIENT EDUCATION: Education details: see above Person educated: Patient and Child(ren) Education method: Explanation, Demonstration, Verbal cues, and Handouts Education comprehension: needs further education  HOME EXERCISE PROGRAM  Take pictures of activities/projects that she would like to focus on at home  GOALS: Goals reviewed with patient? Yes  SHORT TERM GOALS: Target date: 10 sessions  UPDATED: 04/21/2022  UPDATED: 06/24/2022 With moderate assistance, pt will recall her medicines with 80%  accuracy.  Baseline: currently unable 04/21/2022 Goal status: MET; 91% recall independently   2.  With minimal assistance, pt will complete basic medication management task with > 90% accuracy.  Baseline: total 04/21/2022 Goal status: ONGOING; currently requiring moderate to minimal assistance  06/24/2022 Goal Status: MET Updated Goal: with supervision assistance, pt will complete basic medication management task with > 90% accuracy.    3.  Pt will demonstrate selective attention to basic task for 15 minutes with < 2 redirections.  Baseline: moderate assistance 04/21/2022 Goal status: ONGOING; currently requiring minimal to supervision assistance 06/24/2022 Goal Status: NOT MET Updated Goal: Pt will demonstrate selective attention to basic task for 10 minutes with < 5 redirections.    4.  Pt will complete basic bill paying task with 75% accuracy.  Baseline: total 04/21/2022 Goal status: ONGOING; currently at 50% with minimal assistance 06/24/2022 Goal Status: NOT MET Updated Goal: Pt will use external visual to assess which bills have been paid by her son.   5. With minimal assistance, pt will use strategies to complete semi-complex visuospatial tasks with 85% accuracy.   Baseline: moderate  Goal status: INITIAL  04/21/2022  Goal Status: ONGOING  06/21/2022  Goal Status: MET  6. Pt will require < 2 cues to initiate leaving session in 5 out of 6 opportunities.   Baseline: 6 cues  Goal status: INITIAL 04/21/2022 Goal Status: ONGOING 06/21/2022 Goal Status: ONGOING     LONG TERM GOALS: Target date: 08/21/2021 With rare min A, pt will organize her medicines accurately in a pill organizer.  Baseline: Total Goal status: ONGOING   2.  With rare min A, pt will demonstrate selective attention to task for 30 minutes.  Baseline: moderate Goal status: ONGOING   3.  With rare min A, pt will sequence basic meal prep activity.  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 Specifically, pt presents with severe impairments in memory (recall of facts, procedures and past & future events), insight & judgement (understanding one's own limitations & what they mean), problem solving (finding solutions to obstacles), processing speed (quick thinking & understanding), oragnization (arranging ideas in a useful order), reasoning (logically thinking through situaitons) and executive functioning (making a plan, acting it out, evaluating success & adjusting).    OBJECTIVE IMPAIRMENTS include attention and executive functioning. These impairments are limiting patient from managing medications, managing appointments, managing finances, household responsibilities, and ADLs/IADLs. Factors affecting potential to achieve goals and functional outcome are ability to learn/carryover information.  Patient will benefit from skilled SLP services to address above impairments and improve overall function.  REHAB POTENTIAL: Good  PLAN: SLP FREQUENCY: 1-2x/week  SLP DURATION: 12 weeks  PLANNED INTERVENTIONS: Functional tasks, SLP instruction and feedback, and Patient/family education   Kannon Baum B. Rutherford Nail, M.S., CCC-SLP, Mining engineer Certified Brain Injury [place]  [place] Office [phone] Ascom [phone] Fax [phone]

## 2022-07-29 ENCOUNTER — Ambulatory Visit: Payer: Medicare HMO | Admitting: Occupational Therapy

## 2022-07-29 ENCOUNTER — Ambulatory Visit: Payer: Medicare HMO | Admitting: Speech Pathology

## 2022-07-29 DIAGNOSIS — R41841 Cognitive communication deficit: Secondary | ICD-10-CM

## 2022-07-29 DIAGNOSIS — I63511 Cerebral infarction due to unspecified occlusion or stenosis of right middle cerebral artery: Secondary | ICD-10-CM

## 2022-07-29 DIAGNOSIS — M6281 Muscle weakness (generalized): Secondary | ICD-10-CM

## 2022-07-29 DIAGNOSIS — R278 Other lack of coordination: Secondary | ICD-10-CM

## 2022-07-29 NOTE — Therapy (Unsigned)
OUTPATIENT SPEECH LANGUAGE PATHOLOGY TREATMENT NOTE  DISCHARGE SUMMARY   Patient Name: Alyssa Mayo MRN: [id number] DOB:1945-11-02, 77 y.o., female Today's Date: 07/29/2022  PCP: [name], MD REFERRING PROVIDER: [name], NP    End of Session - 07/29/22 1434     Visit Number 24    Number of Visits 27    Date for SLP Re-Evaluation 08/21/22    Authorization Type [employer] Medicare HMO/PPO    Progress Note Due on Visit 30    SLP Start Time 1400    SLP Stop Time  1500    SLP Time Calculation (min) 60 min    Activity Tolerance Patient tolerated treatment well                            Past Medical History:  Diagnosis Date   Abnormal levels of other serum enzymes 07/17/2015   Arthritis    Constipation 07/11/2015   COVID-19 03/06/2021   Diabetes mellitus without complication (HCC)    Elevated liver enzymes    Fatty liver    Generalized abdominal pain 07/11/2015   Hyperlipidemia    Hypertension    Lifelong obesity    Macular degeneration    Morbid obesity due to excess calories (HCC) 07/11/2015   Other fatigue 07/11/2015   Sleep apnea    Spinal headache    Past Surgical History:  Procedure Laterality Date   ABDOMINAL HYSTERECTOMY     APPENDECTOMY     CATARACT EXTRACTION     CERVICAL LAMINECTOMY  07/21/1985   CESAREAN SECTION     COLONOSCOPY  07/21/2012   diverticulosis, [facility] Dr. [name]    COLONOSCOPY WITH PROPOFOL N/A 02/04/2019   Procedure: COLONOSCOPY WITH PROPOFOL;  Surgeon: [name], MD;  Location: [facility] ENDOSCOPY;  Service: Endoscopy;  Laterality: N/A;   COLONOSCOPY WITH PROPOFOL N/A 03/18/2021   Procedure: COLONOSCOPY WITH PROPOFOL;  Surgeon: [name], MD;  Location: [facility] ENDOSCOPY;  Service: Endoscopy;  Laterality: N/A;  DM   DIAGNOSTIC LAPAROSCOPY     ESOPHAGOGASTRODUODENOSCOPY (EGD) WITH PROPOFOL N/A 02/04/2019   Procedure: ESOPHAGOGASTRODUODENOSCOPY (EGD) WITH PROPOFOL;  Surgeon: [name], MD;   Location: [facility] ENDOSCOPY;  Service: Endoscopy;  Laterality: N/A;   ESOPHAGOGASTRODUODENOSCOPY (EGD) WITH PROPOFOL N/A 03/18/2021   Procedure: ESOPHAGOGASTRODUODENOSCOPY (EGD) WITH PROPOFOL;  Surgeon: [name], MD;  Location: [facility] ENDOSCOPY;  Service: Endoscopy;  Laterality: N/A;   LOOP RECORDER INSERTION N/A 08/05/2021   Procedure: LOOP RECORDER INSERTION;  Surgeon: [name], MD;  Location: [facility] INVASIVE CV LAB;  Service: Cardiovascular;  Laterality: N/A;   SPINE SURGERY     TUBAL LIGATION     Patient Active Problem List   Diagnosis Date Noted   Cerebral edema (HCC) 08/07/2021   OSA (obstructive sleep apnea) 08/07/2021   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   CVA (cerebral vascular accident) (HCC) 08/01/2021   GERD (gastroesophageal reflux disease) 07/20/2019   Advanced care planning/counseling discussion 12/17/2016   Skin lesions, generalized 11/13/2016   Chronic right hip pain 06/17/2016   Vitamin D deficiency 09/26/2015   Diastasis recti 08/08/2015   Elevated serum GGT level 07/24/2015   Essential hypertension 07/17/2015   Fatty liver 07/17/2015   Elevated alkaline phosphatase level 07/17/2015   Elevated serum glutamic pyruvic transaminase (SGPT) level 07/17/2015   Abnormal finding on EKG 07/11/2015   Morbid obesity (HCC) 07/11/2015   Colon, diverticulosis 07/11/2015   Abdominal wall hernia 07/11/2015   DM (  diabetes mellitus), type 2 with neurological complications (HCC)    Hyperlipidemia     ONSET DATE: 08/01/2021 CVA; 02/26/2022 date of referral   REFERRING DIAG: I69.319 (ICD-10-CM) - Cognitive deficit, post-stroke I63.511 (ICD-10-CM) - Right middle cerebral artery stroke [facility])     PERTINENT HISTORY: Alyssa Mayo is a 77 y.o. female with history of diabetes, hypertension, hyperlipidemia, obesity, and sleep apnea who presented to [facility] ED on 08/01/2021 after being found down by her son. Her last know well was approximately 07/29/2021. Pt  transferred to [facility] for further CVA management. In addition, pt received ST services in CIR from 08/08/2021 thru 08/21/2021.      DIAGNOSTIC FINDINGS: MRI showed extensive restricted diffusion throughout much of the right MCA territory with associated changes of hemorrhagic transformation and localized cerebral edema and mass effect. Per Dr. Pearlean Brownie, felt embolic pattern secondary to unclear source although concerning for occult A fib. Repeat [facility] 1/14 showed no interval progression of right MCA hemorrhagic infarct with petechial hemorrhage.  CTA head/neck showed right M2 thrombus.  EF 60 to 65%.   THERAPY DIAG:  Cognitive communication deficit  Right middle cerebral artery stroke [facility])  Rationale for Evaluation and Treatment Rehabilitation  SUBJECTIVE: "I took a picture of [name] driving because that is what I want to do, drive"  Pt accompanied by: self, accompanied by her son  PAIN:  Are you having pain? No  PATIENT GOALS to be able to drive again and live independently  OBJECTIVE:   TODAY'S TREATMENT: Skilled treatment session focused on pt's cognitive communication goals. SLP facilitated the session by providing the following interventions:   Pt states that she didn't take pictures of things around the house that she would like to work on because she didn't understand the assignment. However she took a picture of her son driving and wanted to take a picture of the stove because she wants to cook again.   Pt voiced multiple complaints about her son getting the mail as well as daily cleanliness activities. With the help of SLP plan created   Moderate assistance required for selective attention to cognitive task of GOAL-PLAN-DO-REVIEW targeted cleaning out her closet.   Overall, pt required moderate assistance for continued re-direction to task, external assistance from her son to monitor days that she forgets and leaves the Lancets on the table. It appears that pt had these disorganized  behaviors prior to CVA. As such, becoming more organized with household tasks is a chronic concern that we have not been able to impact in therapy sessions.     PATIENT EDUCATION: Education details: see above Person educated: Patient and Child(ren) Education method: Explanation, Demonstration, Verbal cues, and Handouts Education comprehension: needs further education  HOME EXERCISE PROGRAM  Take pictures of activities/projects that she would like to focus on at home  GOALS: Goals reviewed with patient? Yes  SHORT TERM GOALS: Target date: 10 sessions  UPDATED: 04/21/2022  UPDATED: 06/24/2022  UPDATED: 07/30/2022 With moderate assistance, pt will recall her medicines with 80% accuracy.  Baseline: currently unable 04/21/2022 Goal status: MET; 91% recall independently   2.  With minimal assistance, pt will complete basic medication management task with > 90% accuracy.  Baseline: total 04/21/2022 Goal status: ONGOING; currently requiring moderate to minimal assistance  06/24/2022 Goal Status: MET Updated Goal: with supervision assistance, pt will complete basic medication management task with > 90% accuracy.   07/30/2022 Goal Status: MET  3.  Pt will demonstrate selective attention to basic task for 15  minutes with < 2 redirections.  Baseline: moderate assistance 04/21/2022 Goal status: ONGOING; currently requiring minimal to supervision assistance 06/24/2022 Goal Status: NOT MET Updated Goal: Pt will demonstrate selective attention to basic task for 10 minutes with < 5 redirections.   07/30/2022 Goal Status: MET  4.  Pt will complete basic bill paying task with 75% accuracy.  Baseline: total 04/21/2022 Goal status: ONGOING; currently at 50% with minimal assistance 06/24/2022 Goal Status: NOT MET Updated Goal: Pt will use external visual to assess which bills have been paid by her son.  07/21/2022 Goal Status: MET  5. With minimal assistance, pt will use strategies to  complete semi-complex visuospatial tasks with 85% accuracy.   Baseline: moderate  Goal status: INITIAL  04/21/2022  Goal Status: ONGOING  06/21/2022  Goal Status: MET  6. Pt will require < 2 cues to initiate leaving session in 5 out of 6 opportunities.   Baseline: 6 cues  Goal status: INITIAL 04/21/2022 Goal Status: ONGOING 06/21/2022 Goal Status: ONGOING 07/30/22 Goal Status: MET     LONG TERM GOALS: Target date: 08/21/2021 With rare min A, pt will organize her medicines accurately in a pill organizer.  Baseline: Total Goal status: MET   2.  With rare min A, pt will demonstrate selective attention to task for 30 minutes.  Baseline: moderate Goal status: MET   3.  With rare min A, pt will sequence basic meal prep activity.  Baseline: Moderate Goal status: MET     ASSESSMENT:  CLINICAL IMPRESSION: Pt has made lots of progress towards her goals over the course of the skilled ST sessions. She continues to have moderate cognitive communication deficits that are now considered to be her new baseline. 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 Specifically, pt presents with moderate impairments in memory (recall of facts, procedures and past & future events), insight & judgement (understanding one's own limitations & what they mean), problem solving (finding solutions to obstacles), processing speed (quick thinking & understanding), oragnization (arranging ideas in a useful order), reasoning (logically thinking through situaitons) and executive functioning (making a plan, acting it out, evaluating success & adjusting).  At this time, pt's progress has been stable and no longer benefits from skilled ST intervention. Extensive education has been provided to pt and her son on current deficits.    Sandip Power B. Rutherford Nail, M.S., CCC-SLP, Mining engineer Certified Brain Injury [place]  [place] Office [phone] Ascom [phone] Fax [phone]

## 2022-07-30 ENCOUNTER — Ambulatory Visit (HOSPITAL_COMMUNITY): Payer: Medicare HMO | Admitting: Physician Assistant

## 2022-07-30 NOTE — Therapy (Signed)
Occupational Therapy Treatment Note  Patient Name: Alyssa Mayo MRN: [id number] DOB:[birth year], 77 y.o., female Today's Date: 03/14/2022  PCP: [name], MD REFERRING PROVIDER: [name], NP    OT End of Session - 07/24/22 2209     Visit Number 28    Number of Visits 48    Date for OT Re-Evaluation 08/19/22    Authorization Type Progress reporting period starting 06/19/2022    OT Start Time 1145    OT Stop Time 1230    OT Time Calculation (min) 45 min    Activity Tolerance Patient tolerated treatment well    Behavior During Therapy [facility] for tasks assessed/performed                           Past Medical History:  Diagnosis Date   Abnormal levels of other serum enzymes 07/17/2015   Arthritis    Constipation 07/11/2015   COVID-19 03/06/2021   Diabetes mellitus without complication (HCC)    Elevated liver enzymes    Fatty liver    Generalized abdominal pain 07/11/2015   Hyperlipidemia    Hypertension    Lifelong obesity    Macular degeneration    Morbid obesity due to excess calories (HCC) 07/11/2015   Other fatigue 07/11/2015   Sleep apnea    Spinal headache    Past Surgical History:  Procedure Laterality Date   ABDOMINAL HYSTERECTOMY     APPENDECTOMY     CATARACT EXTRACTION     CERVICAL LAMINECTOMY  07/21/1985   CESAREAN SECTION     COLONOSCOPY  07/21/2012   diverticulosis, [facility] Dr. [name]    COLONOSCOPY WITH PROPOFOL N/A 02/04/2019   Procedure: COLONOSCOPY WITH PROPOFOL;  Surgeon: [name], MD;  Location: [facility] ENDOSCOPY;  Service: Endoscopy;  Laterality: N/A;   COLONOSCOPY WITH PROPOFOL N/A 03/18/2021   Procedure: COLONOSCOPY WITH PROPOFOL;  Surgeon: [name], MD;  Location: [facility] ENDOSCOPY;  Service: Endoscopy;  Laterality: N/A;  DM   DIAGNOSTIC LAPAROSCOPY     ESOPHAGOGASTRODUODENOSCOPY (EGD) WITH PROPOFOL N/A 02/04/2019   Procedure: ESOPHAGOGASTRODUODENOSCOPY (EGD) WITH PROPOFOL;  Surgeon: [name], MD;  Location: [facility] ENDOSCOPY;  Service: Endoscopy;  Laterality: N/A;   ESOPHAGOGASTRODUODENOSCOPY (EGD) WITH PROPOFOL N/A 03/18/2021   Procedure: ESOPHAGOGASTRODUODENOSCOPY (EGD) WITH PROPOFOL;  Surgeon: [name], MD;  Location: [facility] ENDOSCOPY;  Service: Endoscopy;  Laterality: N/A;   LOOP RECORDER INSERTION N/A 08/05/2021   Procedure: LOOP RECORDER INSERTION;  Surgeon: [name], MD;  Location: [facility] INVASIVE CV LAB;  Service: Cardiovascular;  Laterality: N/A;   SPINE SURGERY     TUBAL LIGATION     Patient Active Problem List   Diagnosis Date Noted   Cerebral edema (HCC) 08/07/2021   OSA (obstructive sleep apnea) 08/07/2021   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   CVA (cerebral vascular accident) (HCC) 08/01/2021   GERD (gastroesophageal reflux disease) 07/20/2019   Advanced care planning/counseling discussion 12/17/2016   Skin lesions, generalized 11/13/2016   Chronic right hip pain 06/17/2016   Vitamin D deficiency 09/26/2015   Diastasis recti 08/08/2015   Elevated serum GGT level 07/24/2015   Essential hypertension 07/17/2015   Fatty liver 07/17/2015   Elevated alkaline phosphatase level 07/17/2015   Elevated serum glutamic pyruvic transaminase (SGPT) level 07/17/2015   Abnormal finding on EKG 07/11/2015   Morbid obesity (HCC) 07/11/2015   Colon, diverticulosis 07/11/2015   Abdominal wall hernia 07/11/2015  DM (diabetes mellitus), type 2 with neurological complications ([place])    Hyperlipidemia     ONSET DATE: 08/01/2021  REFERRING DIAG: CVA  THERAPY DIAG:  Muscle weakness (generalized)  Other lack of coordination  Rationale for Evaluation and Treatment Rehabilitation  SUBJECTIVE:   SUBJECTIVE STATEMENT:   Pt. reports that she has been discharged from [facility] services.  Pt accompanied by: self  PERTINENT HISTORY:  Pt. is a 77 y.o. female who presents with a diagnosis of RMCA CVA infarction with hemorrhagic transformation. Pt.  attended inpatient rehab from 08/07/2021-08/22/2021. Pt. received Home health therapy services this past spring.  Pt. has recently had an assessment through driver rehabilitation services at [facility] with recommendations for referrals for  outpatient OT/PT, and ST services. Pt. PMHx includes: HTN, Hyperlipidemia, Macular degeneration, DM, and Obesity.  PRECAUTIONS: None  WEIGHT BEARING RESTRICTIONS No  PAIN:  Are you having pain? No   FALLS: Has patient fallen in last 6 months? Yes.  LIVING ENVIRONMENT: Lives with: lives with their family  Son  Alyssa Mayo Lives in: House/apartment Main living are on one floor Stairs:  2 steps to enter Has following equipment at home: Single point cane, Wheelchair (manual), shower chair, Grab bars, bed rail, and rubber mat.  PLOF: Independent  PATIENT GOALS: To be able to Drive, and cook again  OBJECTIVE:   HAND DOMINANCE: Right   ADLs: Overall ADLs:  Transfers/ambulation related to ADLs: Independent Eating: Independent Grooming: Pt. Fatigues with sustained BUE's in elevation for haircare. Especially with blow drying hair. UB Dressing: Independent pullover shirt, independent now with fastening a bra LB Dressing: Independent donning pants socks, and slide on shoes Toileting: Independent Bathing: Independent Tub Shower transfers: Walk-in shower Independent now   IADLs: Shopping: Needs to be accompanied to the grocery store. Light housekeeping: Does own laundry, laundry,  Meal Prep:  Difficulty with cooking Community mobility: Relies on son, and friend Medication management: Son sets up Building surveyor management: Manages monthly bills independently. Handwriting: No change from baseline  MOBILITY STATUS: Hx of falls out of bed   ACTIVITY TOLERANCE: Activity tolerance:  10-20 min. Before rest break  FUNCTIONAL OUTCOME MEASURES: FOTO: 57  TR score: 62  UPPER EXTREMITY ROM     Active ROM Right eval Left eval Left 05/06/2022 Left 05/27/2022  Left 06/19/2022   Shoulder flexion WFL 96(110) 107(110) 109(120) 112(126)  Shoulder abduction WFL 82(105) 86(105) 92(115) 94(115)  Shoulder adduction       Shoulder extension       Shoulder internal rotation       Shoulder external rotation       Elbow flexion [facility] WFL  Elbow extension [facility] WFL  Wrist flexion WFL 44(60) 68(68) WFL WFL  Wrist extension WFL 32(60) 66(66) WFL WFL  Wrist ulnar deviation       Wrist radial deviation       Wrist pronation       Wrist supination       (Blank rows = not tested)   UPPER EXTREMITY MMT:     MMT Right eval Left eval Left 05/06/2022 Left 05/27/2022 Left 06/19/2022  Shoulder flexion 4+/5 3-/5 3+/5 3+/5 3+/5  Shoulder abduction 4+/5 3-/5 3-/5 3+/5 3+/5  Shoulder adduction       Shoulder extension       Shoulder internal rotation       Shoulder external rotation       Middle trapezius       Lower trapezius  Elbow flexion 4+/5 4-/5 4+/5 5/5 5/5  Elbow extension 4+/5 4-/5 4+/5 5/5 5/5  Wrist flexion 4+/5 3-/5 4/5 4+/5 4+/5  Wrist extension 4+/5 3/5 4/5 4+/5 4+/5  Wrist ulnar deviation       Wrist radial deviation       Wrist pronation       Wrist supination       (Blank rows = not tested)  HAND FUNCTION: Grip strength: Right: 30 lbs; Left: 13 lbs, Lateral pinch: Right: 15 lbs, Left: 8 lbs, and 3 point pinch: Right: 13 lbs, Left: 7 lbs  05/06/2022 Grip strength: Right: 30 lbs; Left: 14 lbs, Lateral pinch: Right: 15 lbs, Left: 10 lbs, and 3 point pinch: Right: 13 lbs, Left: 7 lbs  05/27/2022:  Grip strength: Right: 30 lbs; Left: 17 lbs, Lateral pinch: Right: 15 lbs, Left: 11 lbs, and 3 point pinch: Right: 13 lbs, Left: 7 lbs  06/19/2022:  Grip strength: Right: 30 lbs; Left: 18 lbs, Lateral pinch: Right: 15 lbs, Left:  10 lbs, and 3 point pinch: Right: 13 lbs, Left: 8 lbs   COORDINATION:  9 Hole Peg test: Right: 26  sec; Left: 1 min. & 9 sec  05/06/2022 9 Hole Peg test: Right: 26  sec; Left: 1  min.  05/27/2022  9 Hole Peg test: Right: 26  sec; Left: 1 min. 4 sec.   06/19/2022  9 Hole Peg test: Right: 26  sec; Left: 1 min.   SENSATION: Light touch: WFL Proprioception: WFL  COGNITION: Overall cognitive status: Within functional limits for tasks assessed  VISION: Subjective report: Glasses. Just received a new prescription to increase bifocal strength Baseline vision: Macular Degeneration Visual history: macular degeneration  VISION ASSESSMENT: To be further assessed in functional context  Left sided Awareness    PERCEPTION: Limited left sided awareness   TODAY'S TREATMENT:   Therapeutic Ex.  Pt. worked on Left UE ROM scapular elevation depression, abduction/rotation, shoulder flexion, and abduction in supine. Pt. Performed bilateral shoulder flexion with a 2# weight. Pt. Worked on BUE strengthening, and reciprocal motion using the UBE while seated for 8 min. with no resistance. Constant monitoring was provided with occasional support proximally at her left elbow. Pt. worked on reaching with the LUE to place the shapes progressively through the first 2 increasing heights on the shape tower. Pt. worked on the task while sitting unsupported with a mirror for visual feedback to avoid compensation proximally with left shoulder hiking. Pt. required support proximally at the elbow when reaching to the 2nd vertical dowel.  PATIENT EDUCATION: Education details:  Simulated  Person educated: Patient Education method: verbal cues Education comprehension: verbalized understanding and pt does not plan to cook unless son is present.   HOME EXERCISE PROGRAM:  Continue with an ongoing assessment of HEP needs    GOALS: Goals reviewed with patient? Yes   SHORT TERM GOALS: Target date:  04/15/2022      Pt. Will improve FOTO score by 2 points to reflect improved pt. perceived functional performance Baseline: FOTO: 57, TR score: 62 10th visit: FOTO: 65, 20th visit: FOTO 62,  TR score: 62 Goal status: Achieved  LONG TERM GOALS: Target date: 08/19/2022    Pt. Will improve left UE strength by 2 mm grades to assist with repositioning her grandson on her lap. Baseline: Eval: left shoulder flexion: 3-/5, abduction 3-/5,  elbow flex/ext. 4-/5, wrist extension 3/5, wrist flexion 3-/5  10th visit: left shoulder flexion: 3+/5, abduction 3-+5,  elbow flex/ext. 4+/5, wrist  extension 4/5, wrist flexion 4/5 05/27/2022: left shoulder flexion: 3+/5, abduction 3+/5,  elbow flex/ext. 5/5, wrist extension 4+/5, wrist flexion 4+/5  20th visit: left shoulder flexion: 3+/5, abduction 3+/5,  elbow flex/ext. 5/5, wrist extension 4+/5, wrist flexion 4+/5. Pt. Can now hold her grandson grandson on her lap, and reposition him. Pt. Is unable to hold him in standing. Goal status: Ongoing  2.  Pt. will improve left grip strength by 5# to be able to independently hold and use a blow dryer, and brush for hair care. Baseline: Eval: Left grip strength 13#. Pt. Has difficulty performing hair care, holding and using a brush, and blow dryer. 10th visit: Left grip 14#. Pt. Is now able to hold th e blow dryer for the sides, and top of head, not the back. 11/07: Left 17# Pt. Has difficulty blow drying then back of her head.20th visit: Left grip 18#. Pt. Is able to independently hold a brush, and blow dry her hair.  Goal status: Achieved  3.  Pt. Will improve left lateral pinch strength by 3# to be able to independently cut meat. Baseline: Eval: Left 8, Pt. Has difficulty cutting meat. 10th: Left: 10#.  Pt. Is able to cut tender meat, however is not as efficient 11/07: Left 11#. Pt. Is independent cutting meat with increased time. 20th visit: Left: 10#. Pt. Has difficulty positioning, and stabilizing the utensils while cutting meat. Goal status:  Achieved  4.  Pt. Will improve Left hand [facility] skills to be able to be able to independently, and efficiently manipulate small objects during ADL tasks. Baseline:  Eval: left: 1 min. 9 sec. 10th visit: Left: 1 min. Pt.is improving with manipulating small objects. 11/07: Left: 1 min. & 4 sec. Pt. has difficulty manipulating small objects. 20th visit: Left:  1 min. Pt. Is improving with grasping pills, however continues to have difficulty. Goal status: Ongoing   5.  Pt. Will perform light meal preparation with modified independence Baseline: Eval: Pt. reports having difficulty performing light  meal preparation, and cooking tasks. 10th visit: Pt. Is able to make toast in the morning, a sandwich,  use the microwave, make coffee, and pour herself a drink. 11/07: Pt. has difficulty holding a coffee pot when filling the reservoir 20th visit: Pt. Is making waffles in the toaster, heats items in in the microwave, Pt. Uses the oven when supervised.  Goal status: Ongoing  CLINICAL IMPRESSION:  Pt. reports no pain today. Pt. tolerated Ther. Ex. well today, and reports that her arm fells looser. ROM, UBE, and reaching tasks. Pt. Continues to require support proximally at the left elbow with the proximal support gradually tapering off.  Pt. presented with less leaning to the right when reaching with the left UE. Pt. Requires moderate cues cues to avoid compensation proximally with  left shoulder hiking reaching with the left. Pt. continues to work on improving, and maximizing overall independence with ADLs, and IADL tasks.         PERFORMANCE DEFICITS in functional skills including ADLs, IADLs, coordination, dexterity, sensation, ROM, strength, FMC, decreased knowledge of use of DME, vision, and UE functional use, cognitive skills including attention, and psychosocial skills including coping strategies, environmental adaptation, habits, and routines and behaviors.   IMPAIRMENTS are limiting patient from ADLs, IADLs, and social participation.   COMORBIDITIES may have co-morbidities  that affects occupational performance. Patient will benefit from skilled OT to address above  impairments and improve overall function.  MODIFICATION OR ASSISTANCE TO COMPLETE EVALUATION: Min-Moderate modification  of tasks or assist with assess necessary to complete an evaluation.  OT OCCUPATIONAL PROFILE AND HISTORY: Detailed assessment: Review of records and additional review of physical, cognitive, psychosocial history related to current functional performance.  CLINICAL DECISION MAKING: Moderate - several treatment options, min-mod task modification necessary  REHAB POTENTIAL: Good  EVALUATION COMPLEXITY: Moderate    PLAN: OT FREQUENCY: 2x/week  OT DURATION: 12 weeks  PLANNED INTERVENTIONS: self care/ADL training, therapeutic exercise, therapeutic activity, neuromuscular re-education, manual therapy, passive range of motion, and paraffin  RECOMMENDED OTHER SERVICES: ST, and PT  CONSULTED AND AGREED WITH PLAN OF CARE: Patient  PLAN FOR NEXT SESSION:  L hand strengthening and coordination exercises    Olegario Messier, MS, OTR/L

## 2022-07-31 ENCOUNTER — Ambulatory Visit: Payer: Medicare HMO

## 2022-07-31 ENCOUNTER — Ambulatory Visit: Payer: Medicare HMO | Admitting: Speech Pathology

## 2022-07-31 ENCOUNTER — Ambulatory Visit: Payer: Medicare HMO | Admitting: Occupational Therapy

## 2022-07-31 DIAGNOSIS — R262 Difficulty in walking, not elsewhere classified: Secondary | ICD-10-CM

## 2022-07-31 DIAGNOSIS — M6281 Muscle weakness (generalized): Secondary | ICD-10-CM

## 2022-07-31 DIAGNOSIS — R278 Other lack of coordination: Secondary | ICD-10-CM

## 2022-07-31 DIAGNOSIS — R41841 Cognitive communication deficit: Secondary | ICD-10-CM

## 2022-07-31 DIAGNOSIS — I63511 Cerebral infarction due to unspecified occlusion or stenosis of right middle cerebral artery: Secondary | ICD-10-CM

## 2022-07-31 DIAGNOSIS — R2681 Unsteadiness on feet: Secondary | ICD-10-CM

## 2022-07-31 NOTE — Therapy (Addendum)
Occupational Therapy Progress/Recertification Note  Dates of reporting period  06/19/2022   to   07/31/2022     Patient Name: Alyssa Mayo MRN: [id number] DOB:1945/08/26, 77 y.o., female Today's Date: 03/14/2022  PCP: [name], MD REFERRING PROVIDER: [name], NP    OT End of Session -07/31/22    Visit Number 30   Number of Visits 48    Date for OT Re-Evaluation 10/23/2022   Authorization Type Progress reporting period starting 06/19/2022    OT Start Time 1150   OT Stop Time 1230    OT Time Calculation (min) 45 min    Activity Tolerance Patient tolerated treatment well    Behavior During Therapy [facility] for tasks assessed/performed                           Past Medical History:  Diagnosis Date   Abnormal levels of other serum enzymes 07/17/2015   Arthritis    Constipation 07/11/2015   COVID-19 03/06/2021   Diabetes mellitus without complication (HCC)    Elevated liver enzymes    Fatty liver    Generalized abdominal pain 07/11/2015   Hyperlipidemia    Hypertension    Lifelong obesity    Macular degeneration    Morbid obesity due to excess calories (HCC) 07/11/2015   Other fatigue 07/11/2015   Sleep apnea    Spinal headache    Past Surgical History:  Procedure Laterality Date   ABDOMINAL HYSTERECTOMY     APPENDECTOMY     CATARACT EXTRACTION     CERVICAL LAMINECTOMY  07/21/1985   CESAREAN SECTION     COLONOSCOPY  07/21/2012   diverticulosis, [facility] Dr. [name]    COLONOSCOPY WITH PROPOFOL N/A 02/04/2019   Procedure: COLONOSCOPY WITH PROPOFOL;  Surgeon: [name], MD;  Location: [facility] ENDOSCOPY;  Service: Endoscopy;  Laterality: N/A;   COLONOSCOPY WITH PROPOFOL N/A 03/18/2021   Procedure: COLONOSCOPY WITH PROPOFOL;  Surgeon: [name], MD;  Location: [facility] ENDOSCOPY;  Service: Endoscopy;  Laterality: N/A;  DM   DIAGNOSTIC LAPAROSCOPY     ESOPHAGOGASTRODUODENOSCOPY (EGD) WITH PROPOFOL N/A 02/04/2019   Procedure:  ESOPHAGOGASTRODUODENOSCOPY (EGD) WITH PROPOFOL;  Surgeon: [name], MD;  Location: [facility] ENDOSCOPY;  Service: Endoscopy;  Laterality: N/A;   ESOPHAGOGASTRODUODENOSCOPY (EGD) WITH PROPOFOL N/A 03/18/2021   Procedure: ESOPHAGOGASTRODUODENOSCOPY (EGD) WITH PROPOFOL;  Surgeon: [name], MD;  Location: [facility] ENDOSCOPY;  Service: Endoscopy;  Laterality: N/A;   LOOP RECORDER INSERTION N/A 08/05/2021   Procedure: LOOP RECORDER INSERTION;  Surgeon: [name], MD;  Location: [facility] INVASIVE CV LAB;  Service: Cardiovascular;  Laterality: N/A;   SPINE SURGERY     TUBAL LIGATION     Patient Active Problem List   Diagnosis Date Noted   Cerebral edema (HCC) 08/07/2021   OSA (obstructive sleep apnea) 08/07/2021   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   CVA (cerebral vascular accident) (HCC) 08/01/2021   GERD (gastroesophageal reflux disease) 07/20/2019   Advanced care planning/counseling discussion 12/17/2016   Skin lesions, generalized 11/13/2016   Chronic right hip pain 06/17/2016   Vitamin D deficiency 09/26/2015   Diastasis recti 08/08/2015   Elevated serum GGT level 07/24/2015   Essential hypertension 07/17/2015   Fatty liver 07/17/2015   Elevated alkaline phosphatase level 07/17/2015   Elevated serum glutamic pyruvic transaminase (SGPT) level 07/17/2015   Abnormal finding on EKG 07/11/2015   Morbid obesity (HCC) 07/11/2015  Colon, diverticulosis 07/11/2015   Abdominal wall hernia 07/11/2015   DM (diabetes mellitus), type 2 with neurological complications (HCC)    Hyperlipidemia     ONSET DATE: 08/01/2021  REFERRING DIAG: CVA  THERAPY DIAG:  Muscle weakness (generalized)  Other lack of coordination  Rationale for Evaluation and Treatment Rehabilitation  SUBJECTIVE:   SUBJECTIVE STATEMENT:   Pt. Reports doing okay today.  Pt accompanied by: self  PERTINENT HISTORY:  Pt. is a 77 y.o. female who presents with a diagnosis of RMCA CVA infarction  with hemorrhagic transformation. Pt. attended inpatient rehab from 08/07/2021-08/22/2021. Pt. received Home health therapy services this past spring.  Pt. has recently had an assessment through driver rehabilitation services at [facility] with recommendations for referrals for  outpatient OT/PT, and ST services. Pt. PMHx includes: HTN, Hyperlipidemia, Macular degeneration, DM, and Obesity.  PRECAUTIONS: None  WEIGHT BEARING RESTRICTIONS No  PAIN:  Are you having pain? No   FALLS: Has patient fallen in last 6 months? Yes.  LIVING ENVIRONMENT: Lives with: lives with their family  Son  [name] Lives in: House/apartment Main living are on one floor Stairs:  2 steps to enter Has following equipment at home: Single point cane, Wheelchair (manual), shower chair, Grab bars, bed rail, and rubber mat.  PLOF: Independent  PATIENT GOALS: To be able to Drive, and cook again  OBJECTIVE:   HAND DOMINANCE: Right   ADLs: Overall ADLs:  Transfers/ambulation related to ADLs: Independent Eating: Independent Grooming: Pt. Fatigues with sustained BUE's in elevation for haircare. Especially with blow drying hair. UB Dressing: Independent pullover shirt, independent now with fastening a bra LB Dressing: Independent donning pants socks, and slide on shoes Toileting: Independent Bathing: Independent Tub Shower transfers: Walk-in shower Independent now   IADLs: Shopping: Needs to be accompanied to the grocery store. Light housekeeping: Does own laundry, laundry,  Meal Prep:  Difficulty with cooking Community mobility: Relies on son, and friend Medication management: Son sets up Mining engineer management: Manages monthly bills independently. Handwriting: No change from baseline  MOBILITY STATUS: Hx of falls out of bed   ACTIVITY TOLERANCE: Activity tolerance:  10-20 min. Before rest break  FUNCTIONAL OUTCOME MEASURES: FOTO: 57  TR score: 62  UPPER EXTREMITY ROM     Active ROM Right eval  Left eval Left 05/06/2022 Left 05/27/2022 Left 06/19/2022  Left 07/31/2022  Shoulder flexion WFL 96(110) 107(110) 109(120) 112(126) 536(644)  Shoulder abduction WFL 82(105) 86(105) 92(115) 94(115) 96(116)  Shoulder adduction        Shoulder extension        Shoulder internal rotation        Shoulder external rotation        Elbow flexion [facility] WFL  Elbow extension [facility] WFL  Wrist flexion WFL 44(60) 68(68) WFL WFL WFL  Wrist extension WFL 32(60) 66(66) WFL WFL WFL  Wrist ulnar deviation        Wrist radial deviation        Wrist pronation        Wrist supination        (Blank rows = not tested)   UPPER EXTREMITY MMT:     MMT Right eval Left eval Left 05/06/2022 Left 05/27/2022 Left 06/19/2022 Left 07/31/2022  Shoulder flexion 4+/5 3-/5 3+/5 3+/5 3+/5 3+/5  Shoulder abduction 4+/5 3-/5 3-/5 3+/5 3+/5 3+/5  Shoulder adduction        Shoulder extension        Shoulder internal  rotation        Shoulder external rotation        Middle trapezius        Lower trapezius        Elbow flexion 4+/5 4-/5 4+/5 5/5 5/5 5/5  Elbow extension 4+/5 4-/5 4+/5 5/5 5/5 5/5  Wrist flexion 4+/5 3-/5 4/5 4+/5 4+/5 4+/5  Wrist extension 4+/5 3/5 4/5 4+/5 4+/5 4+/5  Wrist ulnar deviation        Wrist radial deviation        Wrist pronation        Wrist supination        (Blank rows = not tested)  HAND FUNCTION: Grip strength: Right: 30 lbs; Left: 13 lbs, Lateral pinch: Right: 15 lbs, Left: 8 lbs, and 3 point pinch: Right: 13 lbs, Left: 7 lbs  05/06/2022 Grip strength: Right: 30 lbs; Left: 14 lbs, Lateral pinch: Right: 15 lbs, Left: 10 lbs, and 3 point pinch: Right: 13 lbs, Left: 7 lbs  05/27/2022:  Grip strength: Right: 30 lbs; Left: 17 lbs, Lateral pinch: Right: 15 lbs, Left: 11 lbs, and 3 point pinch: Right: 13 lbs, Left: 7 lbs  06/19/2022:  Grip strength: Right: 30 lbs; Left: 18 lbs, Lateral pinch: Right: 15 lbs, Left:  10 lbs, and 3 point pinch: Right:  13 lbs, Left: 8 lbs  07/31/2022:   Grip strength: Right: 30 lbs; Left: 13 lbs, Lateral pinch: Right: 15 lbs, Left:  10 lbs, and 3 point pinch: Right: 13 lbs, Left: 10 lbs   COORDINATION:  9 Hole Peg test: Right: 26  sec; Left: 1 min. & 9 sec  05/06/2022 9 Hole Peg test: Right: 26  sec; Left: 1 min.  05/27/2022  9 Hole Peg test: Right: 26  sec; Left: 1 min. 4 sec.   06/19/2022  9 Hole Peg test: Right: 26  sec; Left: 1 min.   07/31/2022  9 Hole Peg test: Right: 26  sec; Left: 59 sec.    SENSATION: Light touch: WFL Proprioception: WFL  COGNITION: Overall cognitive status: Within functional limits for tasks assessed  VISION: Subjective report: Glasses. Just received a new prescription to increase bifocal strength Baseline vision: Macular Degeneration Visual history: macular degeneration  VISION ASSESSMENT: To be further assessed in functional context  Left sided Awareness    PERCEPTION: Limited left sided awareness   TODAY'S TREATMENT:     PATIENT EDUCATION: Education details:  Simulated  Person educated: Patient Education method: verbal cues Education comprehension: verbalized understanding and pt does not plan to cook unless son is present.   HOME EXERCISE PROGRAM:  Continue with an ongoing assessment of HEP needs    GOALS: Goals reviewed with patient? Yes   SHORT TERM GOALS: Target date:  04/15/2022      Pt. Will improve FOTO score by 2 points to reflect improved pt. perceived functional performance Baseline: FOTO: 57, TR score: 62 10th visit: FOTO: 65, 20th visit: FOTO 62, TR score: 62, 30th visit: FOTO score: 60 Goal status: Achieved  LONG TERM GOALS: Target date: 08/19/2022    Pt. Will improve left UE strength by 2 mm grades to assist with repositioning her grandson on her lap. Baseline: Eval: left shoulder flexion: 3-/5, abduction 3-/5,  elbow flex/ext. 4-/5, wrist extension 3/5, wrist flexion 3-/5  10th visit: left shoulder flexion:  3+/5, abduction 3-+5,  elbow flex/ext. 4+/5, wrist extension 4/5, wrist flexion 4/5 05/27/2022: left shoulder flexion: 3+/5, abduction 3+/5,  elbow flex/ext. 5/5, wrist extension 4+/5, wrist  flexion 4+/5  20th visit: left shoulder flexion: 3+/5, abduction 3+/5,  elbow flex/ext. 5/5, wrist extension 4+/5, wrist flexion 4+/5. Pt. can now hold her grandson grandson on her lap, and reposition him. Pt. Is unable to hold him in standing. 30th visit: left shoulder flexion: 3+/5, abduction 3+/5,  elbow flex/ext. 5/5, wrist extension 4+/5, wrist flexion 4+/5 Goal status: Ongoing  2.  Pt. will improve left grip strength by 5# to be able to independently hold and use a blow dryer, and brush for hair care. Baseline: Eval: Left grip strength 13#. Pt. has difficulty performing hair care, holding and using a brush, and blow dryer. 10th visit: Left grip 14#. Pt. Is now able to hold th e blow dryer for the sides, and top of head, not the back. 11/07: Left 17# Pt. Has difficulty blow drying then back of her head.20th visit: Left grip 18#. Pt. Is able to independently hold a brush, and blow dry her hair.  Goal status: Achieved  3.  Pt. Will improve left lateral pinch strength by 3# to be able to independently cut meat. Baseline: Eval: Left 8, Pt. Has difficulty cutting meat. 10th: Left: 10#.  Pt. is able to cut tender meat, however is not as efficient 11/07: Left 11#. Pt. Is independent cutting meat with increased time. 20th visit: Left: 10#. Pt. Has difficulty positioning, and stabilizing the utensils while cutting meat. Goal status:  Achieved  4.  Pt. Will improve Left hand [facility] skills to be able to be able to independently, and efficiently manipulate small objects during ADL tasks. Baseline: Eval: left: 1 min. 9 sec. 10th visit: Left: 1 min. Pt.is improving with manipulating small objects. 11/07: Left: 1 min. & 4 sec. Pt. has difficulty manipulating small objects. 20th visit: Left:  1 min. Pt. is improving with grasping  pills, however continues to have difficulty. 30th visit: Left:  1 min. Pt. Is improving with grasping pills, however continues to have difficulty. Goal status: Ongoing   5.  Pt. Will perform light meal preparation with modified independence Baseline: Eval: Pt. reports having difficulty performing light  meal preparation, and cooking tasks. 10th visit: Pt. is able to make toast in the morning, a sandwich,  use the microwave, make coffee, and pour herself a drink. 11/07: Pt. has difficulty holding a coffee pot when filling the reservoir 20th visit: Pt. Is making waffles in the toaster, heats items in the microwave. Pt. uses the oven when supervised. 30th visit: Pt. continues to perform light meal prep, hand heats in the microwave. Goal status: Ongoing  6.: Pt. will improve with left grip strength to be able to securely hold a drink.   Baseline: 30th visit: Left grip strength 13#. Pt. has difficulty holding a full drink  securely with the left hand.  Goal status: New  7. Pt. Will improve typing speed, and accuracy in preparation for efficiently typing simple email message.  Baseline: 30th visit: Typing net speed: 3 wpm, accuracy 48% for a 1 min. Typing test.  Goal status: New        CLINICAL IMPRESSION:  Pt. has made progress over this past recertification, and progress reporting period. Pt. has improved with left shoulder ROM for abduction, and flexion. Pt. has improved with left pinch strength, and left hand [facility] skills. Pt.'s FOTO score is 60. Pt. Is able to hold a hair dryer with the left hand while blow drying her hair. Pt. Is having difficulty securely holding a glass in preparation for drinking. Pt. continues  to present with limited [facility] with difficulty manipulating small objects efficiently. Pt. presents with impaired typing speed, and accuracy. Pt. Would benefit from education about compensatory strategies for left sided awareness when holding items in her left hand. Pt. continues to work on  improving, and maximizing overall independence with ADLs, and IADL tasks.         PERFORMANCE DEFICITS in functional skills including ADLs, IADLs, coordination, dexterity, sensation, ROM, strength, FMC, decreased knowledge of use of DME, vision, and UE functional use, cognitive skills including attention, and psychosocial skills including coping strategies, environmental adaptation, habits, and routines and behaviors.   IMPAIRMENTS are limiting patient from ADLs, IADLs, and social participation.   COMORBIDITIES may have co-morbidities  that affects occupational performance. Patient will benefit from skilled OT to address above impairments and improve overall function.  MODIFICATION OR ASSISTANCE TO COMPLETE EVALUATION: Min-Moderate modification of tasks or assist with assess necessary to complete an evaluation.  OT OCCUPATIONAL PROFILE AND HISTORY: Detailed assessment: Review of records and additional review of physical, cognitive, psychosocial history related to current functional performance.  CLINICAL DECISION MAKING: Moderate - several treatment options, min-mod task modification necessary  REHAB POTENTIAL: Good  EVALUATION COMPLEXITY: Moderate    PLAN: OT FREQUENCY: 2x/week  OT DURATION: 12 weeks  PLANNED INTERVENTIONS: self care/ADL training, therapeutic exercise, therapeutic activity, neuromuscular re-education, manual therapy, passive range of motion, and paraffin  RECOMMENDED OTHER SERVICES: ST, and PT  CONSULTED AND AGREED WITH PLAN OF CARE: Patient  PLAN FOR NEXT SESSION:  L hand strengthening and coordination exercises    Harrel Carina, MS, OTR/L

## 2022-07-31 NOTE — Therapy (Signed)
OUTPATIENT PHYSICAL THERAPY NEURO TREATMENT    Patient Name: Alyssa Mayo MRN: [id number] DOB:07/13/1946, 77 y.o., female Today's Date: 07/31/2022   PCP: [place] Pink, MD REFERRING PROVIDER: Frann Rider, NP   PT End of Session - 07/31/22 1116     Visit Number 26    Number of Visits 45    Date for PT Re-Evaluation 09/16/22    Authorization Type [employer]    Authorization Time Period 04/02/2022-06/25/2022; 06/24/2022-09/16/2022    PT Start Time 1109    PT Stop Time 1150    PT Time Calculation (min) 41 min                 Past Medical History:  Diagnosis Date   Abnormal levels of other serum enzymes 07/17/2015   Arthritis    Constipation 07/11/2015   COVID-19 03/06/2021   Diabetes mellitus without complication ([place])    Elevated liver enzymes    Fatty liver    Generalized abdominal pain 07/11/2015   Hyperlipidemia    Hypertension    Lifelong obesity    Macular degeneration    Morbid obesity due to excess calories ([place]) 07/11/2015   Other fatigue 07/11/2015   Sleep apnea    Spinal headache    Past Surgical History:  Procedure Laterality Date   ABDOMINAL HYSTERECTOMY     APPENDECTOMY     CATARACT EXTRACTION     CERVICAL LAMINECTOMY  07/21/1985   CESAREAN SECTION     COLONOSCOPY  07/21/2012   diverticulosis, [facility] Dr. Donnella Sham    COLONOSCOPY WITH PROPOFOL N/A 02/04/2019   Procedure: COLONOSCOPY WITH PROPOFOL;  Surgeon: Lollie Sails, MD;  Location: [facility] ENDOSCOPY;  Service: Endoscopy;  Laterality: N/A;   COLONOSCOPY WITH PROPOFOL N/A 03/18/2021   Procedure: COLONOSCOPY WITH PROPOFOL;  Surgeon: Lesly Rubenstein, MD;  Location: [facility] ENDOSCOPY;  Service: Endoscopy;  Laterality: N/A;  DM   DIAGNOSTIC LAPAROSCOPY     ESOPHAGOGASTRODUODENOSCOPY (EGD) WITH PROPOFOL N/A 02/04/2019   Procedure: ESOPHAGOGASTRODUODENOSCOPY (EGD) WITH PROPOFOL;  Surgeon: Lollie Sails, MD;  Location: [facility] ENDOSCOPY;  Service: Endoscopy;  Laterality: N/A;    ESOPHAGOGASTRODUODENOSCOPY (EGD) WITH PROPOFOL N/A 03/18/2021   Procedure: ESOPHAGOGASTRODUODENOSCOPY (EGD) WITH PROPOFOL;  Surgeon: Lesly Rubenstein, MD;  Location: [facility] ENDOSCOPY;  Service: Endoscopy;  Laterality: N/A;   LOOP RECORDER INSERTION N/A 08/05/2021   Procedure: LOOP RECORDER INSERTION;  Surgeon: Evans Lance, MD;  Location: [facility];  Service: Cardiovascular;  Laterality: N/A;   SPINE SURGERY     TUBAL LIGATION     Patient Active Problem List   Diagnosis Date Noted   Cerebral edema ([place]) 08/07/2021   OSA (obstructive sleep apnea) 08/07/2021   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   CVA (cerebral vascular accident) ([place]) 08/01/2021   GERD (gastroesophageal reflux disease) 07/20/2019   Advanced care planning/counseling discussion 12/17/2016   Skin lesions, generalized 11/13/2016   Chronic right hip pain 06/17/2016   Vitamin D deficiency 09/26/2015   Diastasis recti 08/08/2015   Elevated serum GGT level 07/24/2015   Essential hypertension 07/17/2015   Fatty liver 07/17/2015   Elevated alkaline phosphatase level 07/17/2015   Elevated serum glutamic pyruvic transaminase (SGPT) level 07/17/2015   Abnormal finding on EKG 07/11/2015   Morbid obesity ([place]) 07/11/2015   Colon, diverticulosis 07/11/2015   Abdominal wall hernia 07/11/2015   DM (diabetes mellitus), type 2 with neurological complications [facility])    Hyperlipidemia     ONSET DATE: January 11th 2023  REFERRING DIAG:  I69.398,R26.9 (ICD-10-CM) - Gait disturbance, post-stroke  I63.511 (ICD-10-CM) - Right middle cerebral artery stroke (HCC)    THERAPY DIAG:  Muscle weakness (generalized)  Other lack of coordination  Cognitive communication deficit  Right middle cerebral artery stroke (HCC)  Difficulty in walking, not elsewhere classified  Unsteadiness on feet  Rationale for Evaluation and Treatment Rehabilitation  SUBJECTIVE:  Pt reports she is ready to begin therapy.  Pt  notes that she has been discharged from [facility] at this time.    Pt accompanied by: self; [name] her son also accompanying pt.   PERTINENT HISTORY: Per chart and confirmed by pt:  Pt is a 77 yo female with RMCA CVA infarction with hemorrhagic transformation 08/01/2021, with inpatient rehab 08/07/2021-08/22/2021 followed by [facility] therapy until June. Pt now currently being seen for outpatient OT and ST services. Pt reports limitations in stamina persist. Other PMH is significant for HTN, HLD, macular degeneration, DM, obesity, sleep apnea, arthritis, chronic R hip pain, vitamin D deficiency, diastasis recti, abdominal wall hernia, hx of abdominal hysterectomy, appendectomy, spine surgery (years ago).   PAIN:  Are you having pain?  0/10 Pain Location:     TODAY'S TREATMENT:   TherEx:  NuStep, seat 8, arms 8, interval training for improved cardiovascular performance Level 1 - 2 min Level 3 - 1 min Level 2 - 1 min Level 4 - 2 min Level 3 - 1 min Level 2 - 1 min Level 1 - 2 min as cool-off period  STS with 2 Airex pads stacked on floor to increase hip flexion for increased challenge, 2x10    Neuro:  Activity Description: Random tapping with B LE and B UE Activity Setting:  Random Number of Pods:  6 (3 on table, 3 on floor) Cycles/Sets:  5 Duration (Time or Hit Count):  30 hits  Patient Stats  Reaction Time:   1,752 1,776 1,448 1,469 1,133    Activity Description: Random tapping with B LE and B UE (Red light R UE or R LE; Green light L UE or L LE) Activity Setting:  Random Number of Pods:  6 (3 on table, 3 on floor) Cycles/Sets:  5 Duration (Time or Hit Count):  30 hits  Patient Stats  Reaction Time:   2,094 1,614      PATIENT EDUCATION: Education details: exam findings, indications, plan, HEP Person educated: Patient Education method: Explanation, Demonstration, Verbal cues, and Handouts Education comprehension: verbalized understanding, returned demonstration,  verbal cues required, and needs further education   HOME EXERCISE PROGRAM:  Access Code: [email or web address] URL: [email or web address] Date: 06/02/2022 Prepared by: Alvera Novel  Exercises - Standing Single Leg Stance with Counter Support  - 2 x daily - 7 x weekly - 1 sets - 10 reps - 10s  hold - Standing Marching  - 2 x daily - 7 x weekly - 2 sets - 20 reps - Side Stepping with Counter Support  - 2 x daily - 7 x weekly - 2 sets - 20 reps - Walking  - 2 x daily - 7 x weekly    GOALS:  Goals reviewed with patient? Yes  SHORT TERM GOALS: Target date: 09/11/2022  Patient will be independent in home exercise program to improve strength/mobility for better functional independence with ADLs. Baseline: initiated; Patient reports walking but not always independent with progressive HEP. Will keep goal active to anticipate more balance ex for HEP.  Goal status: PROGRESSING   LONG TERM GOALS: Target date: 10/23/2022  Patient will increase FOTO score  to equal to or greater than 65  to demonstrate improvement in mobility and quality of life.  Baseline: 57 06/24/2022= 61 Goal status: IN PROGRESS  2.  Patient (> 14 years old) will complete five times sit to stand test in < 15 seconds indicating an increased LE strength and improved balance. Baseline: 17 sec hands-free 05/21/2022 - 15.36 sec 06/24/2022 - 13.56 sec  Goal status: GOAL MET  3.  Patient will increase 10 meter walk test to >1.8m/s as to improve gait speed for better community ambulation and to reduce fall risk. Baseline: 0.66 m/s; 11/1:  0.85 m/s 06/24/2022= 0.97 m/s without an AD Goal status: IN PROGRESS  4.  Patient will increase Berg Balance score by > 3 points to demonstrate decreased fall risk during functional activities. Baseline: 50/56 06/24/2022= 52/56 Goal status: IN PROGRESS  5.  Patient will increase six minute walk test distance to >1000 for progression to community ambulator and improve gait  ability Baseline: 04/10/22: 646ft (pain onset at 5 minutes) 05/21/22: 1027 ft 06/23/2022= 940 feet - will keep goal active until patient able to consistently achieve > 1000 feet Goal status: IN PROGRESS    ASSESSMENT:  CLINICAL IMPRESSION:  Pt put forth great effort throughout the session and was able to tolerate increased difficulty with STS technique by applying an extra airex pad to increase hip flexion prior to attempting STS.  Pt also performed well with the neuro related blazepod tasks.  Pt able to improve timing with all tasks and sequencing of movements with the related tasks.  Pt to continue to be challenged with neuro/balance related tasks during future sessions.  Pt will continue to benefit from skilled therapy to address remaining deficits in order to improve overall QoL and return to PLOF.       OBJECTIVE IMPAIRMENTS Abnormal gait, decreased activity tolerance, decreased balance, decreased coordination, decreased endurance, decreased mobility, difficulty walking, decreased strength, impaired sensation, impaired UE functional use, improper body mechanics, postural dysfunction, and pain.   ACTIVITY LIMITATIONS carrying, lifting, bending, standing, squatting, stairs, bed mobility, bathing, reach over head, hygiene/grooming, and locomotion level  PARTICIPATION LIMITATIONS: meal prep, cleaning, laundry, medication management, personal finances, driving, shopping, community activity, and yard work  PERSONAL FACTORS Age, Sex, Time since onset of injury/illness/exacerbation, and 3+ comorbidities: Other PMH is significant for HTN, HLD, macular degeneration, DM, obesity, sleep apnea, arthritis, chronic R hip pain, vitamin D deficiency, diastasis recti, abdominal wall hernia, hx of abdominal hysterectomy, appendectomy, spine surgery (years ago).   are also affecting patient's functional outcome.   REHAB POTENTIAL: Good  CLINICAL DECISION MAKING: Evolving/moderate complexity  EVALUATION  COMPLEXITY: Moderate  PLAN: PT FREQUENCY: 2x/week  PT DURATION: 12 weeks  PLANNED INTERVENTIONS: Therapeutic exercises, Therapeutic activity, Neuromuscular re-education, Balance training, Gait training, Patient/Family education, Self Care, Joint mobilization, Joint manipulation, Stair training, Vestibular training, Canalith repositioning, Orthotic/Fit training, DME instructions, Dry Needling, Electrical stimulation, Wheelchair mobility training, Spinal mobilization, Cryotherapy, Moist heat, Splintting, Taping, Traction, Ultrasound, Biofeedback, Manual therapy, and Re-evaluation  PLAN FOR NEXT SESSION:   LE strengthening: leg press/squats/etc. HEP compliance and introduce more cognitive related tasks.   Gwenlyn Saran, PT, DPT Physical Therapist- [facility]  07/31/22, 11:17 AM

## 2022-08-05 ENCOUNTER — Ambulatory Visit: Payer: Medicare HMO | Admitting: Occupational Therapy

## 2022-08-05 ENCOUNTER — Ambulatory Visit: Payer: Medicare HMO | Admitting: Speech Pathology

## 2022-08-05 DIAGNOSIS — R278 Other lack of coordination: Secondary | ICD-10-CM

## 2022-08-05 DIAGNOSIS — R41841 Cognitive communication deficit: Secondary | ICD-10-CM

## 2022-08-05 DIAGNOSIS — M6281 Muscle weakness (generalized): Secondary | ICD-10-CM

## 2022-08-07 ENCOUNTER — Encounter: Payer: Self-pay | Admitting: Occupational Therapy

## 2022-08-07 ENCOUNTER — Ambulatory Visit: Payer: Medicare HMO

## 2022-08-07 ENCOUNTER — Encounter: Payer: Medicare HMO | Admitting: Speech Pathology

## 2022-08-07 ENCOUNTER — Ambulatory Visit: Payer: Medicare HMO | Admitting: Occupational Therapy

## 2022-08-07 DIAGNOSIS — M6281 Muscle weakness (generalized): Secondary | ICD-10-CM

## 2022-08-07 DIAGNOSIS — R262 Difficulty in walking, not elsewhere classified: Secondary | ICD-10-CM

## 2022-08-07 DIAGNOSIS — R278 Other lack of coordination: Secondary | ICD-10-CM

## 2022-08-07 DIAGNOSIS — I63511 Cerebral infarction due to unspecified occlusion or stenosis of right middle cerebral artery: Secondary | ICD-10-CM

## 2022-08-07 DIAGNOSIS — R2681 Unsteadiness on feet: Secondary | ICD-10-CM

## 2022-08-07 DIAGNOSIS — R41841 Cognitive communication deficit: Secondary | ICD-10-CM

## 2022-08-07 NOTE — Therapy (Signed)
OUTPATIENT OCCUPATIONAL THERAPY TREATMENT NOTE   Patient Name: Alyssa Mayo MRN: [id number] DOB:13-Jul-1946, 77 y.o., female Today's Date: 03/14/2022  PCP: [name], MD REFERRING PROVIDER: [name], NP  OT End of Session - 08/07/22 1145     Visit Number 32    Number of Visits 48    Date for OT Re-Evaluation 08/19/22    Authorization Type Progress reporting period starting 06/19/2022    OT Start Time 1145    OT Stop Time 1230    OT Time Calculation (min) 45 min    Activity Tolerance Patient tolerated treatment well    Behavior During Therapy [facility] for tasks assessed/performed             Past Medical History:  Diagnosis Date   Abnormal levels of other serum enzymes 07/17/2015   Arthritis    Constipation 07/11/2015   COVID-19 03/06/2021   Diabetes mellitus without complication (HCC)    Elevated liver enzymes    Fatty liver    Generalized abdominal pain 07/11/2015   Hyperlipidemia    Hypertension    Lifelong obesity    Macular degeneration    Morbid obesity due to excess calories (HCC) 07/11/2015   Other fatigue 07/11/2015   Sleep apnea    Spinal headache    Past Surgical History:  Procedure Laterality Date   ABDOMINAL HYSTERECTOMY     APPENDECTOMY     CATARACT EXTRACTION     CERVICAL LAMINECTOMY  07/21/1985   CESAREAN SECTION     COLONOSCOPY  07/21/2012   diverticulosis, [facility] Dr. [name]    COLONOSCOPY WITH PROPOFOL N/A 02/04/2019   Procedure: COLONOSCOPY WITH PROPOFOL;  Surgeon: [name], MD;  Location: [facility] ENDOSCOPY;  Service: Endoscopy;  Laterality: N/A;   COLONOSCOPY WITH PROPOFOL N/A 03/18/2021   Procedure: COLONOSCOPY WITH PROPOFOL;  Surgeon: [name], MD;  Location: [facility] ENDOSCOPY;  Service: Endoscopy;  Laterality: N/A;  DM   DIAGNOSTIC LAPAROSCOPY     ESOPHAGOGASTRODUODENOSCOPY (EGD) WITH PROPOFOL N/A 02/04/2019   Procedure: ESOPHAGOGASTRODUODENOSCOPY (EGD) WITH PROPOFOL;  Surgeon: [name], MD;   Location: [facility] ENDOSCOPY;  Service: Endoscopy;  Laterality: N/A;   ESOPHAGOGASTRODUODENOSCOPY (EGD) WITH PROPOFOL N/A 03/18/2021   Procedure: ESOPHAGOGASTRODUODENOSCOPY (EGD) WITH PROPOFOL;  Surgeon: [name], MD;  Location: [facility] ENDOSCOPY;  Service: Endoscopy;  Laterality: N/A;   LOOP RECORDER INSERTION N/A 08/05/2021   Procedure: LOOP RECORDER INSERTION;  Surgeon: [name], MD;  Location: [facility] INVASIVE CV LAB;  Service: Cardiovascular;  Laterality: N/A;   SPINE SURGERY     TUBAL LIGATION     Patient Active Problem List   Diagnosis Date Noted   Cerebral edema (HCC) 08/07/2021   OSA (obstructive sleep apnea) 08/07/2021   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   CVA (cerebral vascular accident) (HCC) 08/01/2021   GERD (gastroesophageal reflux disease) 07/20/2019   Advanced care planning/counseling discussion 12/17/2016   Skin lesions, generalized 11/13/2016   Chronic right hip pain 06/17/2016   Vitamin D deficiency 09/26/2015   Diastasis recti 08/08/2015   Elevated serum GGT level 07/24/2015   Essential hypertension 07/17/2015   Fatty liver 07/17/2015   Elevated alkaline phosphatase level 07/17/2015   Elevated serum glutamic pyruvic transaminase (SGPT) level 07/17/2015   Abnormal finding on EKG 07/11/2015   Morbid obesity (HCC) 07/11/2015   Colon, diverticulosis 07/11/2015   Abdominal wall hernia 07/11/2015   DM (diabetes mellitus), type 2 with neurological complications (HCC)    Hyperlipidemia  ONSET DATE: 08/01/2021  REFERRING DIAG: CVA  THERAPY DIAG:  Muscle weakness (generalized)  Other lack of coordination  Right middle cerebral artery stroke [facility])  Rationale for Evaluation and Treatment Rehabilitation  SUBJECTIVE:   SUBJECTIVE STATEMENT:  Reports plan to get new hearing aids and is feeling sleepy after PT.    Pt accompanied by: self  PERTINENT HISTORY:  Pt. is a 77 y.o. female who presents with a diagnosis of RMCA CVA infarction  with hemorrhagic transformation. Pt. attended inpatient rehab from 08/07/2021-08/22/2021. Pt. received Home health therapy services this past spring.  Pt. has recently had an assessment through driver rehabilitation services at [facility] with recommendations for referrals for  outpatient OT/PT, and ST services. Pt. PMHx includes: HTN, Hyperlipidemia, Macular degeneration, DM, and Obesity.  PRECAUTIONS: None  WEIGHT BEARING RESTRICTIONS No  PAIN:  Are you having pain? No   FALLS: Has patient fallen in last 6 months? Yes.  LIVING ENVIRONMENT: Lives with: lives with their family  Son  [name] Lives in: House/apartment Main living are on one floor Stairs:  2 steps to enter Has following equipment at home: Single point cane, Wheelchair (manual), shower chair, Grab bars, bed rail, and rubber mat.  PLOF: Independent  PATIENT GOALS: To be able to Drive, and cook again  OBJECTIVE:   HAND DOMINANCE: Right   ADLs: Overall ADLs:  Transfers/ambulation related to ADLs: Independent Eating: Independent Grooming: Pt. Fatigues with sustained BUE's in elevation for haircare. Especially with blow drying hair. UB Dressing: Independent pullover shirt, independent now with fastening a bra LB Dressing: Independent donning pants socks, and slide on shoes Toileting: Independent Bathing: Independent Tub Shower transfers: Walk-in shower Independent now   IADLs: Shopping: Needs to be accompanied to the grocery store. Light housekeeping: Does own laundry, laundry,  Meal Prep:  Difficulty with cooking Community mobility: Relies on son, and friend Medication management: Son sets up Building surveyor management: Manages monthly bills independently. Handwriting: No change from baseline  MOBILITY STATUS: Hx of falls out of bed   ACTIVITY TOLERANCE: Activity tolerance:  10-20 min. Before rest break  FUNCTIONAL OUTCOME MEASURES: FOTO: 57  TR score: 62  UPPER EXTREMITY ROM     Active ROM Right eval  Left eval Left 05/06/2022 Left 05/27/2022 Left 06/19/2022  Left 07/31/2022  Shoulder flexion WFL 96(110) 107(110) 109(120) 112(126) 161(096)  Shoulder abduction WFL 82(105) 86(105) 92(115) 94(115) 96(116)  Shoulder adduction        Shoulder extension        Shoulder internal rotation        Shoulder external rotation        Elbow flexion [facility] WFL  Elbow extension [facility] WFL  Wrist flexion WFL 44(60) 68(68) WFL WFL WFL  Wrist extension WFL 32(60) 66(66) WFL WFL WFL  Wrist ulnar deviation        Wrist radial deviation        Wrist pronation        Wrist supination        (Blank rows = not tested)   UPPER EXTREMITY MMT:     MMT Right eval Left eval Left 05/06/2022 Left 05/27/2022 Left 06/19/2022 Left 07/31/2022  Shoulder flexion 4+/5 3-/5 3+/5 3+/5 3+/5 3+/5  Shoulder abduction 4+/5 3-/5 3-/5 3+/5 3+/5 3+/5  Shoulder adduction        Shoulder extension        Shoulder internal rotation        Shoulder external rotation  Middle trapezius        Lower trapezius        Elbow flexion 4+/5 4-/5 4+/5 5/5 5/5 5/5  Elbow extension 4+/5 4-/5 4+/5 5/5 5/5 5/5  Wrist flexion 4+/5 3-/5 4/5 4+/5 4+/5 4+/5  Wrist extension 4+/5 3/5 4/5 4+/5 4+/5 4+/5  Wrist ulnar deviation        Wrist radial deviation        Wrist pronation        Wrist supination        (Blank rows = not tested)  HAND FUNCTION: Grip strength: Right: 30 lbs; Left: 13 lbs, Lateral pinch: Right: 15 lbs, Left: 8 lbs, and 3 point pinch: Right: 13 lbs, Left: 7 lbs  05/06/2022 Grip strength: Right: 30 lbs; Left: 14 lbs, Lateral pinch: Right: 15 lbs, Left: 10 lbs, and 3 point pinch: Right: 13 lbs, Left: 7 lbs  05/27/2022:  Grip strength: Right: 30 lbs; Left: 17 lbs, Lateral pinch: Right: 15 lbs, Left: 11 lbs, and 3 point pinch: Right: 13 lbs, Left: 7 lbs  06/19/2022:  Grip strength: Right: 30 lbs; Left: 18 lbs, Lateral pinch: Right: 15 lbs, Left:  10 lbs, and 3 point pinch: Right:  13 lbs, Left: 8 lbs  07/31/2022:   Grip strength: Right: 30 lbs; Left: 13 lbs, Lateral pinch: Right: 15 lbs, Left:  10 lbs, and 3 point pinch: Right: 13 lbs, Left: 10 lbs   COORDINATION:  9 Hole Peg test: Right: 26  sec; Left: 1 min. & 9 sec  05/06/2022 9 Hole Peg test: Right: 26  sec; Left: 1 min.  05/27/2022  9 Hole Peg test: Right: 26  sec; Left: 1 min. 4 sec.   06/19/2022  9 Hole Peg test: Right: 26  sec; Left: 1 min.   07/31/2022  9 Hole Peg test: Right: 26  sec; Left: 59 sec.    SENSATION: Light touch: WFL Proprioception: WFL  COGNITION: Overall cognitive status: Within functional limits for tasks assessed  VISION: Subjective report: Glasses. Just received a new prescription to increase bifocal strength Baseline vision: Macular Degeneration Visual history: macular degeneration  VISION ASSESSMENT: To be further assessed in functional context  Left sided Awareness    PERCEPTION: Limited left sided awareness   TODAY'S TREATMENT:  Therapeutic Exercise:  Pt worked on pinch strengthening in the left hand for lateral, and 3pt. pinch using yellow, red, green, and blue resistive clips. Pt. worked on placing the clips at various vertical and horizontal angles. Tactile and verbal cues were required. Attempted black clip, completes with R hand assistance.   Neuromuscular Reeducation:  Pt performed [facility] tasks using the Grooved pegboard. Pt worked on grasping the grooved pegs from a horizontal position and worked on Comptroller, moving the pegs to a vertical position in the hand to prepare for placing them in the grooved slot. Pt worked on Architect with her right hand to grasp pegs and return to bowl.   Self Care: Pt c/o difficulty buckling/unbuckling seatbelt - discussed body mechanics and adapted strategies. Plan to practice at home this weekend. Pt worked on typing accuracy and speed with <50% accuracy. Educated on use of autocorrect to decrease  frustration and improve speed for typing emails at home.    PATIENT EDUCATION: Education details:  Simulated  Person educated: Patient Education method: verbal cues Education comprehension: verbalized understanding and pt does not plan to cook unless son is present.   HOME EXERCISE PROGRAM:  Continue with an ongoing assessment of  HEP needs    GOALS: Goals reviewed with patient? Yes   SHORT TERM GOALS: Target date:  04/15/2022      Pt. Will improve FOTO score by 2 points to reflect improved pt. perceived functional performance Baseline: FOTO: 57, TR score: 62 10th visit: FOTO: 65, 20th visit: FOTO 62, TR score: 62, 30th visit: FOTO score: 60 Goal status: Achieved  LONG TERM GOALS: Target date: 08/19/2022    Pt. Will improve left UE strength by 2 mm grades to assist with repositioning her grandson on her lap. Baseline: Eval: left shoulder flexion: 3-/5, abduction 3-/5,  elbow flex/ext. 4-/5, wrist extension 3/5, wrist flexion 3-/5  10th visit: left shoulder flexion: 3+/5, abduction 3-+5,  elbow flex/ext. 4+/5, wrist extension 4/5, wrist flexion 4/5 05/27/2022: left shoulder flexion: 3+/5, abduction 3+/5,  elbow flex/ext. 5/5, wrist extension 4+/5, wrist flexion 4+/5  20th visit: left shoulder flexion: 3+/5, abduction 3+/5,  elbow flex/ext. 5/5, wrist extension 4+/5, wrist flexion 4+/5. Pt. can now hold her grandson grandson on her lap, and reposition him. Pt. Is unable to hold him in standing. 30th visit: left shoulder flexion: 3+/5, abduction 3+/5,  elbow flex/ext. 5/5, wrist extension 4+/5, wrist flexion 4+/5 Goal status: Ongoing  2.  Pt. will improve left grip strength by 5# to be able to independently hold and use a blow dryer, and brush for hair care. Baseline: Eval: Left grip strength 13#. Pt. has difficulty performing hair care, holding and using a brush, and blow dryer. 10th visit: Left grip 14#. Pt. Is now able to hold th e blow dryer for the sides, and top of head, not the  back. 11/07: Left 17# Pt. Has difficulty blow drying then back of her head.20th visit: Left grip 18#. Pt. Is able to independently hold a brush, and blow dry her hair.  Goal status: Achieved  3.  Pt. Will improve left lateral pinch strength by 3# to be able to independently cut meat. Baseline: Eval: Left 8, Pt. Has difficulty cutting meat. 10th: Left: 10#.  Pt. is able to cut tender meat, however is not as efficient 11/07: Left 11#. Pt. Is independent cutting meat with increased time. 20th visit: Left: 10#. Pt. Has difficulty positioning, and stabilizing the utensils while cutting meat. Goal status:  Achieved  4.  Pt. Will improve Left hand [facility] skills to be able to be able to independently, and efficiently manipulate small objects during ADL tasks. Baseline: Eval: left: 1 min. 9 sec. 10th visit: Left: 1 min. Pt.is improving with manipulating small objects. 11/07: Left: 1 min. & 4 sec. Pt. has difficulty manipulating small objects. 20th visit: Left:  1 min. Pt. is improving with grasping pills, however continues to have difficulty. 30th visit: Left:  1 min. Pt. Is improving with grasping pills, however continues to have difficulty. Goal status: Ongoing   5.  Pt. Will perform light meal preparation with modified independence Baseline: Eval: Pt. reports having difficulty performing light  meal preparation, and cooking tasks. 10th visit: Pt. is able to make toast in the morning, a sandwich,  use the microwave, make coffee, and pour herself a drink. 11/07: Pt. has difficulty holding a coffee pot when filling the reservoir 20th visit: Pt. Is making waffles in the toaster, heats items in the microwave. Pt. uses the oven when supervised. 30th visit: Pt. continues to perform light meal prep, hand heats in the microwave. Goal status: Ongoing  6.: Pt. will improve with left grip strength to be able to securely hold a  drink.   Baseline: 30th visit: Left grip strength 13#. Pt. has difficulty holding a full drink   securely with the left hand.  Goal status: New  7. Pt. Will improve typing speed, and accuracy in preparation for efficiently typing simple email message.  Baseline: 30th visit: Typing net speed: 3 wpm, accuracy 48% for a 1 min. Typing test.  Goal status: New        CLINICAL IMPRESSION:  Pt has continued to make good progress, she requires significantly increased time to complete tasks (typing, peg board). Difficulty recognizing errors during typing, cues to utilize autocorrect for increased speed. Noted differences in timing of movements and difficulty with simultaneous movements between left and right. Plan to practice typing at home this weekend and practice buckling seatbelt. Continue to work towards goals in plan of care to maximize safety and independence in necessary daily tasks.          PERFORMANCE DEFICITS in functional skills including ADLs, IADLs, coordination, dexterity, sensation, ROM, strength, FMC, decreased knowledge of use of DME, vision, and UE functional use, cognitive skills including attention, and psychosocial skills including coping strategies, environmental adaptation, habits, and routines and behaviors.   IMPAIRMENTS are limiting patient from ADLs, IADLs, and social participation.   COMORBIDITIES may have co-morbidities  that affects occupational performance. Patient will benefit from skilled OT to address above impairments and improve overall function.  MODIFICATION OR ASSISTANCE TO COMPLETE EVALUATION: Min-Moderate modification of tasks or assist with assess necessary to complete an evaluation.  OT OCCUPATIONAL PROFILE AND HISTORY: Detailed assessment: Review of records and additional review of physical, cognitive, psychosocial history related to current functional performance.  CLINICAL DECISION MAKING: Moderate - several treatment options, min-mod task modification necessary  REHAB POTENTIAL: Good  EVALUATION COMPLEXITY: Moderate    PLAN: OT  FREQUENCY: 2x/week  OT DURATION: 12 weeks  PLANNED INTERVENTIONS: self care/ADL training, therapeutic exercise, therapeutic activity, neuromuscular re-education, manual therapy, passive range of motion, and paraffin  RECOMMENDED OTHER SERVICES: ST, and PT  CONSULTED AND AGREED WITH PLAN OF CARE: Patient  PLAN FOR NEXT SESSION:  L hand strengthening and coordination exercises  Dessie Coma, M.S. OTR/L  08/07/22, 12:22 PM  ascom [phone]    Leonides Cave, OT 08/07/2022, 12:22 PM

## 2022-08-07 NOTE — Therapy (Signed)
OUTPATIENT PHYSICAL THERAPY NEURO TREATMENT    Patient Name: Alyssa Mayo MRN: [id number] DOB:17-Aug-1945, 77 y.o., female Today's Date: 08/07/2022   PCP: Alyssa Mina, MD REFERRING PROVIDER: [name], NP   PT End of Session - 08/07/22 1103     Visit Number 27    Number of Visits 43    Date for PT Re-Evaluation 09/16/22    Authorization Type [employer]    Authorization Time Period 04/02/2022-06/25/2022; 06/24/2022-09/16/2022    PT Start Time 1103    PT Stop Time 1145    PT Time Calculation (min) 42 min                  Past Medical History:  Diagnosis Date   Abnormal levels of other serum enzymes 07/17/2015   Arthritis    Constipation 07/11/2015   COVID-19 03/06/2021   Diabetes mellitus without complication (HCC)    Elevated liver enzymes    Fatty liver    Generalized abdominal pain 07/11/2015   Hyperlipidemia    Hypertension    Lifelong obesity    Macular degeneration    Morbid obesity due to excess calories (HCC) 07/11/2015   Other fatigue 07/11/2015   Sleep apnea    Spinal headache    Past Surgical History:  Procedure Laterality Date   ABDOMINAL HYSTERECTOMY     APPENDECTOMY     CATARACT EXTRACTION     CERVICAL LAMINECTOMY  07/21/1985   CESAREAN SECTION     COLONOSCOPY  07/21/2012   diverticulosis, [facility] Dr. Ricki Mayo    COLONOSCOPY WITH PROPOFOL N/A 02/04/2019   Procedure: COLONOSCOPY WITH PROPOFOL;  Surgeon: Alyssa Deem, MD;  Location: [facility] ENDOSCOPY;  Service: Endoscopy;  Laterality: N/A;   COLONOSCOPY WITH PROPOFOL N/A 03/18/2021   Procedure: COLONOSCOPY WITH PROPOFOL;  Surgeon: Alyssa Bill, MD;  Location: [facility] ENDOSCOPY;  Service: Endoscopy;  Laterality: N/A;  DM   DIAGNOSTIC LAPAROSCOPY     ESOPHAGOGASTRODUODENOSCOPY (EGD) WITH PROPOFOL N/A 02/04/2019   Procedure: ESOPHAGOGASTRODUODENOSCOPY (EGD) WITH PROPOFOL;  Surgeon: Alyssa Deem, MD;  Location: [facility] ENDOSCOPY;  Service: Endoscopy;  Laterality: N/A;    ESOPHAGOGASTRODUODENOSCOPY (EGD) WITH PROPOFOL N/A 03/18/2021   Procedure: ESOPHAGOGASTRODUODENOSCOPY (EGD) WITH PROPOFOL;  Surgeon: Alyssa Bill, MD;  Location: [facility] ENDOSCOPY;  Service: Endoscopy;  Laterality: N/A;   LOOP RECORDER INSERTION N/A 08/05/2021   Procedure: LOOP RECORDER INSERTION;  Surgeon: Alyssa Maw, MD;  Location: [facility] INVASIVE CV LAB;  Service: Cardiovascular;  Laterality: N/A;   SPINE SURGERY     TUBAL LIGATION     Patient Active Problem List   Diagnosis Date Noted   Cerebral edema (HCC) 08/07/2021   OSA (obstructive sleep apnea) 08/07/2021   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   CVA (cerebral vascular accident) (HCC) 08/01/2021   GERD (gastroesophageal reflux disease) 07/20/2019   Advanced care planning/counseling discussion 12/17/2016   Skin lesions, generalized 11/13/2016   Chronic right hip pain 06/17/2016   Vitamin D deficiency 09/26/2015   Diastasis recti 08/08/2015   Elevated serum GGT level 07/24/2015   Essential hypertension 07/17/2015   Fatty liver 07/17/2015   Elevated alkaline phosphatase level 07/17/2015   Elevated serum glutamic pyruvic transaminase (SGPT) level 07/17/2015   Abnormal finding on EKG 07/11/2015   Morbid obesity (HCC) 07/11/2015   Colon, diverticulosis 07/11/2015   Abdominal wall hernia 07/11/2015   DM (diabetes mellitus), type 2 with neurological complications [facility])    Hyperlipidemia     ONSET DATE: January 11th 2023  REFERRING DIAG:  I69.398,R26.9 (ICD-10-CM) - Gait disturbance, post-stroke  I63.511 (ICD-10-CM) - Right middle cerebral artery stroke (HCC)    THERAPY DIAG:  Muscle weakness (generalized)  Other lack of coordination  Cognitive communication deficit  Right middle cerebral artery stroke (HCC)  Difficulty in walking, not elsewhere classified  Unsteadiness on feet  Rationale for Evaluation and Treatment Rehabilitation  SUBJECTIVE:  Pt states she is sleepy today.  Pt said she woke  up at 1 am and took a shower because she wanted to get the cat hair off of her from spending the day at her brother's house (he has cats).    Pt accompanied by: self; Alyssa Mayo her son also accompanying pt.   PERTINENT HISTORY: Per chart and confirmed by pt:  Pt is a 77 yo female with RMCA CVA infarction with hemorrhagic transformation 08/01/2021, with inpatient rehab 08/07/2021-08/22/2021 followed by [facility] therapy until June. Pt now currently being seen for outpatient OT and ST services. Pt reports limitations in stamina persist. Other PMH is significant for HTN, HLD, macular degeneration, DM, obesity, sleep apnea, arthritis, chronic R hip pain, vitamin D deficiency, diastasis recti, abdominal wall hernia, hx of abdominal hysterectomy, appendectomy, spine surgery (years ago).   PAIN:  Are you having pain?  0/10 Pain Location:     TODAY'S TREATMENT:     TherEx:   STS with 1 Airex pads on floor to increase hip flexion for increased challenge, 2x10  Attempted multiple times with increased hip flexion for challenge, but pt too lethargic to complete.  Vitals assessed due to lethargy expressed.  Vitals assessed mid-therapy:  BP: 144/68 mmHg HR: 95 bpm  Seated hip adduction into yellow physioball, 2x15 Seated LAQ with hip adduction into yellow physioball, 2x15    Neuro:  Activity Description: Climbing up stairs to level of lit blaze pods and returning to home base at bottom of steps Activity Setting:  Home Base Number of Pods:  5 Cycles/Sets:  3 Duration (Time or Hit Count):  10   Patient Stats  Reaction Time:   4,166 0,630 1,601          PATIENT EDUCATION: Education details: exam findings, indications, plan, HEP Person educated: Patient Education method: Explanation, Demonstration, Verbal cues, and Handouts Education comprehension: verbalized understanding, returned demonstration, verbal cues required, and needs further education   HOME EXERCISE PROGRAM:  Access Code:  [email or web address] URL: [email or web address] Date: 06/02/2022 Prepared by: [name]  Exercises - Standing Single Leg Stance with Counter Support  - 2 x daily - 7 x weekly - 1 sets - 10 reps - 10s  hold - Standing Marching  - 2 x daily - 7 x weekly - 2 sets - 20 reps - Side Stepping with Counter Support  - 2 x daily - 7 x weekly - 2 sets - 20 reps - Walking  - 2 x daily - 7 x weekly    GOALS:  Goals reviewed with patient? Yes  SHORT TERM GOALS: Target date: 09/18/2022  Patient will be independent in home exercise program to improve strength/mobility for better functional independence with ADLs. Baseline: initiated; Patient reports walking but not always independent with progressive HEP. Will keep goal active to anticipate more balance ex for HEP.  Goal status: PROGRESSING   LONG TERM GOALS: Target date: 10/30/2022  Patient will increase FOTO score to equal to or greater than 65  to demonstrate improvement in mobility and quality of life.  Baseline: 57 06/24/2022= 61 Goal status: IN PROGRESS  2.  Patient (> 60 years  old) will complete five times sit to stand test in < 15 seconds indicating an increased LE strength and improved balance. Baseline: 17 sec hands-free 05/21/2022 - 15.36 sec 06/24/2022 - 13.56 sec  Goal status: GOAL MET  3.  Patient will increase 10 meter walk test to >1.28m/s as to improve gait speed for better community ambulation and to reduce fall risk. Baseline: 0.66 m/s; 11/1:  0.85 m/s 06/24/2022= 0.97 m/s without an AD Goal status: IN PROGRESS  4.  Patient will increase Berg Balance score by > 3 points to demonstrate decreased fall risk during functional activities. Baseline: 50/56 06/24/2022= 52/56 Goal status: IN PROGRESS  5.  Patient will increase six minute walk test distance to >1000 for progression to community ambulator and improve gait ability Baseline: 04/10/22: 648ft (pain onset at 5 minutes) 05/21/22: 1027 ft 06/23/2022= 940 feet - will  keep goal active until patient able to consistently achieve > 1000 feet Goal status: IN PROGRESS    ASSESSMENT:  CLINICAL IMPRESSION:  Pt had greater difficulty completing the exercises that were presented today, specifically with the STS.  Pt had to regress to where she has been and only perform on one airex pad.  Pt still attempted to put forth good effort, but was limited by lethargy.  Vitals were assessed, and found to be WNL.  Pt notes she doesn't normally eat breakfast before coming and she also had a warm ride over to the clinic that made her sleep.   Pt will continue to benefit from skilled therapy to address remaining deficits in order to improve overall QoL and return to PLOF.         OBJECTIVE IMPAIRMENTS Abnormal gait, decreased activity tolerance, decreased balance, decreased coordination, decreased endurance, decreased mobility, difficulty walking, decreased strength, impaired sensation, impaired UE functional use, improper body mechanics, postural dysfunction, and pain.   ACTIVITY LIMITATIONS carrying, lifting, bending, standing, squatting, stairs, bed mobility, bathing, reach over head, hygiene/grooming, and locomotion level  PARTICIPATION LIMITATIONS: meal prep, cleaning, laundry, medication management, personal finances, driving, shopping, community activity, and yard work  PERSONAL FACTORS Age, Sex, Time since onset of injury/illness/exacerbation, and 3+ comorbidities: Other PMH is significant for HTN, HLD, macular degeneration, DM, obesity, sleep apnea, arthritis, chronic R hip pain, vitamin D deficiency, diastasis recti, abdominal wall hernia, hx of abdominal hysterectomy, appendectomy, spine surgery (years ago).   are also affecting patient's functional outcome.   REHAB POTENTIAL: Good  CLINICAL DECISION MAKING: Evolving/moderate complexity  EVALUATION COMPLEXITY: Moderate  PLAN: PT FREQUENCY: 2x/week  PT DURATION: 12 weeks  PLANNED INTERVENTIONS: Therapeutic  exercises, Therapeutic activity, Neuromuscular re-education, Balance training, Gait training, Patient/Family education, Self Care, Joint mobilization, Joint manipulation, Stair training, Vestibular training, Canalith repositioning, Orthotic/Fit training, DME instructions, Dry Needling, Electrical stimulation, Wheelchair mobility training, Spinal mobilization, Cryotherapy, Moist heat, Splintting, Taping, Traction, Ultrasound, Biofeedback, Manual therapy, and Re-evaluation  PLAN FOR NEXT SESSION:   LE strengthening: leg press/squats/etc. HEP compliance and introduce more cognitive related tasks.   Gwenlyn Saran, PT, DPT Physical Therapist- [facility]  08/07/22, 1:52 PM

## 2022-08-07 NOTE — Therapy (Signed)
OUTPATIENT OCCUPATIONAL THERAPY TREATMENT NOTE   Patient Name: Suri Tafolla MRN: [id number] DOB:1945/09/10, 77 y.o., female Today's Date: 03/14/2022  PCP: [place] Pink, MD REFERRING PROVIDER: Frann Rider, NP  OT End of Session - 08/07/22 0917     Visit Number 31    Number of Visits 35    Date for OT Re-Evaluation 08/19/22    Authorization Type Progress reporting period starting 06/19/2022    OT Start Time 1045    OT Stop Time 1133    OT Time Calculation (min) 48 min    Activity Tolerance Patient tolerated treatment well    Behavior During Therapy [facility] for tasks assessed/performed             Past Medical History:  Diagnosis Date   Abnormal levels of other serum enzymes 07/17/2015   Arthritis    Constipation 07/11/2015   COVID-19 03/06/2021   Diabetes mellitus without complication ([place])    Elevated liver enzymes    Fatty liver    Generalized abdominal pain 07/11/2015   Hyperlipidemia    Hypertension    Lifelong obesity    Macular degeneration    Morbid obesity due to excess calories ([place]) 07/11/2015   Other fatigue 07/11/2015   Sleep apnea    Spinal headache    Past Surgical History:  Procedure Laterality Date   ABDOMINAL HYSTERECTOMY     APPENDECTOMY     CATARACT EXTRACTION     CERVICAL LAMINECTOMY  07/21/1985   CESAREAN SECTION     COLONOSCOPY  07/21/2012   diverticulosis, [facility] Dr. Donnella Sham    COLONOSCOPY WITH PROPOFOL N/A 02/04/2019   Procedure: COLONOSCOPY WITH PROPOFOL;  Surgeon: Lollie Sails, MD;  Location: [facility] ENDOSCOPY;  Service: Endoscopy;  Laterality: N/A;   COLONOSCOPY WITH PROPOFOL N/A 03/18/2021   Procedure: COLONOSCOPY WITH PROPOFOL;  Surgeon: Lesly Rubenstein, MD;  Location: [facility] ENDOSCOPY;  Service: Endoscopy;  Laterality: N/A;  DM   DIAGNOSTIC LAPAROSCOPY     ESOPHAGOGASTRODUODENOSCOPY (EGD) WITH PROPOFOL N/A 02/04/2019   Procedure: ESOPHAGOGASTRODUODENOSCOPY (EGD) WITH PROPOFOL;  Surgeon: Lollie Sails, MD;   Location: [facility] ENDOSCOPY;  Service: Endoscopy;  Laterality: N/A;   ESOPHAGOGASTRODUODENOSCOPY (EGD) WITH PROPOFOL N/A 03/18/2021   Procedure: ESOPHAGOGASTRODUODENOSCOPY (EGD) WITH PROPOFOL;  Surgeon: Lesly Rubenstein, MD;  Location: [facility] ENDOSCOPY;  Service: Endoscopy;  Laterality: N/A;   LOOP RECORDER INSERTION N/A 08/05/2021   Procedure: LOOP RECORDER INSERTION;  Surgeon: Evans Lance, MD;  Location: [place] CV LAB;  Service: Cardiovascular;  Laterality: N/A;   SPINE SURGERY     TUBAL LIGATION     Patient Active Problem List   Diagnosis Date Noted   Cerebral edema ([place]) 08/07/2021   OSA (obstructive sleep apnea) 08/07/2021   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/07/2021   CVA (cerebral vascular accident) ([place]) 08/01/2021   GERD (gastroesophageal reflux disease) 07/20/2019   Advanced care planning/counseling discussion 12/17/2016   Skin lesions, generalized 11/13/2016   Chronic right hip pain 06/17/2016   Vitamin D deficiency 09/26/2015   Diastasis recti 08/08/2015   Elevated serum GGT level 07/24/2015   Essential hypertension 07/17/2015   Fatty liver 07/17/2015   Elevated alkaline phosphatase level 07/17/2015   Elevated serum glutamic pyruvic transaminase (SGPT) level 07/17/2015   Abnormal finding on EKG 07/11/2015   Morbid obesity ([place]) 07/11/2015   Colon, diverticulosis 07/11/2015   Abdominal wall hernia 07/11/2015   DM (diabetes mellitus), type 2 with neurological complications ([place])    Hyperlipidemia  ONSET DATE: 08/01/2021  REFERRING DIAG: CVA  THERAPY DIAG:  Muscle weakness (generalized)  Other lack of coordination  Cognitive communication deficit  Rationale for Evaluation and Treatment Rehabilitation  SUBJECTIVE:   SUBJECTIVE STATEMENT:  Pt reports she is cold today with the rainy cold weather and she should have worn a heavier jacket.  Didn't have PT today.  Pt reports left shoulder pain earlier but better with movement   Pt accompanied  by: self  PERTINENT HISTORY:  Pt. is a 77 y.o. female who presents with a diagnosis of RMCA CVA infarction with hemorrhagic transformation. Pt. attended inpatient rehab from 08/07/2021-08/22/2021. Pt. received Home health therapy services this past spring.  Pt. has recently had an assessment through driver rehabilitation services at [facility] with recommendations for referrals for  outpatient OT/PT, and ST services. Pt. PMHx includes: HTN, Hyperlipidemia, Macular degeneration, DM, and Obesity.  PRECAUTIONS: None  WEIGHT BEARING RESTRICTIONS No  PAIN:  Are you having pain? No   FALLS: Has patient fallen in last 6 months? Yes.  LIVING ENVIRONMENT: Lives with: lives with their family  Son  [name] Lives in: House/apartment Main living are on one floor Stairs:  2 steps to enter Has following equipment at home: Single point cane, Wheelchair (manual), shower chair, Grab bars, bed rail, and rubber mat.  PLOF: Independent  PATIENT GOALS: To be able to Drive, and cook again  OBJECTIVE:   HAND DOMINANCE: Right   ADLs: Overall ADLs:  Transfers/ambulation related to ADLs: Independent Eating: Independent Grooming: Pt. Fatigues with sustained BUE's in elevation for haircare. Especially with blow drying hair. UB Dressing: Independent pullover shirt, independent now with fastening a bra LB Dressing: Independent donning pants socks, and slide on shoes Toileting: Independent Bathing: Independent Tub Shower transfers: Walk-in shower Independent now   IADLs: Shopping: Needs to be accompanied to the grocery store. Light housekeeping: Does own laundry, laundry,  Meal Prep:  Difficulty with cooking Community mobility: Relies on son, and friend Medication management: Son sets up Building surveyor management: Manages monthly bills independently. Handwriting: No change from baseline  MOBILITY STATUS: Hx of falls out of bed   ACTIVITY TOLERANCE: Activity tolerance:  10-20 min. Before rest  break  FUNCTIONAL OUTCOME MEASURES: FOTO: 57  TR score: 62  UPPER EXTREMITY ROM     Active ROM Right eval Left eval Left 05/06/2022 Left 05/27/2022 Left 06/19/2022  Left 07/31/2022  Shoulder flexion WFL 96(110) 107(110) 109(120) 112(126) 527(782)  Shoulder abduction WFL 82(105) 86(105) 92(115) 94(115) 96(116)  Shoulder adduction        Shoulder extension        Shoulder internal rotation        Shoulder external rotation        Elbow flexion [facility] WFL  Elbow extension [facility] WFL  Wrist flexion WFL 44(60) 68(68) WFL WFL WFL  Wrist extension WFL 32(60) 66(66) WFL WFL WFL  Wrist ulnar deviation        Wrist radial deviation        Wrist pronation        Wrist supination        (Blank rows = not tested)   UPPER EXTREMITY MMT:     MMT Right eval Left eval Left 05/06/2022 Left 05/27/2022 Left 06/19/2022 Left 07/31/2022  Shoulder flexion 4+/5 3-/5 3+/5 3+/5 3+/5 3+/5  Shoulder abduction 4+/5 3-/5 3-/5 3+/5 3+/5 3+/5  Shoulder adduction        Shoulder extension  Shoulder internal rotation        Shoulder external rotation        Middle trapezius        Lower trapezius        Elbow flexion 4+/5 4-/5 4+/5 5/5 5/5 5/5  Elbow extension 4+/5 4-/5 4+/5 5/5 5/5 5/5  Wrist flexion 4+/5 3-/5 4/5 4+/5 4+/5 4+/5  Wrist extension 4+/5 3/5 4/5 4+/5 4+/5 4+/5  Wrist ulnar deviation        Wrist radial deviation        Wrist pronation        Wrist supination        (Blank rows = not tested)  HAND FUNCTION: Grip strength: Right: 30 lbs; Left: 13 lbs, Lateral pinch: Right: 15 lbs, Left: 8 lbs, and 3 point pinch: Right: 13 lbs, Left: 7 lbs  05/06/2022 Grip strength: Right: 30 lbs; Left: 14 lbs, Lateral pinch: Right: 15 lbs, Left: 10 lbs, and 3 point pinch: Right: 13 lbs, Left: 7 lbs  05/27/2022:  Grip strength: Right: 30 lbs; Left: 17 lbs, Lateral pinch: Right: 15 lbs, Left: 11 lbs, and 3 point pinch: Right: 13 lbs, Left: 7  lbs  06/19/2022:  Grip strength: Right: 30 lbs; Left: 18 lbs, Lateral pinch: Right: 15 lbs, Left:  10 lbs, and 3 point pinch: Right: 13 lbs, Left: 8 lbs  07/31/2022:   Grip strength: Right: 30 lbs; Left: 13 lbs, Lateral pinch: Right: 15 lbs, Left:  10 lbs, and 3 point pinch: Right: 13 lbs, Left: 10 lbs   COORDINATION:  9 Hole Peg test: Right: 26  sec; Left: 1 min. & 9 sec  05/06/2022 9 Hole Peg test: Right: 26  sec; Left: 1 min.  05/27/2022  9 Hole Peg test: Right: 26  sec; Left: 1 min. 4 sec.   06/19/2022  9 Hole Peg test: Right: 26  sec; Left: 1 min.   07/31/2022  9 Hole Peg test: Right: 26  sec; Left: 59 sec.    SENSATION: Light touch: WFL Proprioception: WFL  COGNITION: Overall cognitive status: Within functional limits for tasks assessed  VISION: Subjective report: Glasses. Just received a new prescription to increase bifocal strength Baseline vision: Macular Degeneration Visual history: macular degeneration  VISION ASSESSMENT: To be further assessed in functional context  Left sided Awareness    PERCEPTION: Limited left sided awareness   TODAY'S TREATMENT:  Therapeutic Exercise:  Pt seen for ROM and strengthening this date, performing UBE for 8 mins total, from a seated position, forward and backwards rotations and with therapist providing assist at times at left elbow and hand for grip adherence and facilitating correct positioning and motion.   Reaching tasks with use of [place] looped ball tower performed in standing, Pt completing all levels of reach, top level with effort and occasional guiding to facilitate normal movement pattern.  Additional reaching tasks included use of 30 degree angle wedge with pegboard and ball pegs to place and remove with use of left UE.  Occasional cues for prehension and reaching patterns. AAROM with therapist assist with facilitation of shoulder flexion, ABD, scaption followed by active assisted reaching to hair, towards back  of her head  Neuromuscular Reeducation:  Pt seen for coordination task of card shuffling with regular sized deck of playing cards, pt slower with left hand and demonstrates difficulty with managing cards with the same timing and simultaneous movements between the two hands.    PATIENT EDUCATION: Education details:  Simulated  Person educated: Patient  Education method: verbal cues Education comprehension: verbalized understanding and pt does not plan to cook unless son is present.   HOME EXERCISE PROGRAM:  Continue with an ongoing assessment of HEP needs    GOALS: Goals reviewed with patient? Yes   SHORT TERM GOALS: Target date:  04/15/2022      Pt. Will improve FOTO score by 2 points to reflect improved pt. perceived functional performance Baseline: FOTO: 57, TR score: 62 10th visit: FOTO: 65, 20th visit: FOTO 62, TR score: 62, 30th visit: FOTO score: 60 Goal status: Achieved  LONG TERM GOALS: Target date: 08/19/2022    Pt. Will improve left UE strength by 2 mm grades to assist with repositioning her grandson on her lap. Baseline: Eval: left shoulder flexion: 3-/5, abduction 3-/5,  elbow flex/ext. 4-/5, wrist extension 3/5, wrist flexion 3-/5  10th visit: left shoulder flexion: 3+/5, abduction 3-+5,  elbow flex/ext. 4+/5, wrist extension 4/5, wrist flexion 4/5 05/27/2022: left shoulder flexion: 3+/5, abduction 3+/5,  elbow flex/ext. 5/5, wrist extension 4+/5, wrist flexion 4+/5  20th visit: left shoulder flexion: 3+/5, abduction 3+/5,  elbow flex/ext. 5/5, wrist extension 4+/5, wrist flexion 4+/5. Pt. can now hold her grandson grandson on her lap, and reposition him. Pt. Is unable to hold him in standing. 30th visit: left shoulder flexion: 3+/5, abduction 3+/5,  elbow flex/ext. 5/5, wrist extension 4+/5, wrist flexion 4+/5 Goal status: Ongoing  2.  Pt. will improve left grip strength by 5# to be able to independently hold and use a blow dryer, and brush for hair care. Baseline:  Eval: Left grip strength 13#. Pt. has difficulty performing hair care, holding and using a brush, and blow dryer. 10th visit: Left grip 14#. Pt. Is now able to hold th e blow dryer for the sides, and top of head, not the back. 11/07: Left 17# Pt. Has difficulty blow drying then back of her head.20th visit: Left grip 18#. Pt. Is able to independently hold a brush, and blow dry her hair.  Goal status: Achieved  3.  Pt. Will improve left lateral pinch strength by 3# to be able to independently cut meat. Baseline: Eval: Left 8, Pt. Has difficulty cutting meat. 10th: Left: 10#.  Pt. is able to cut tender meat, however is not as efficient 11/07: Left 11#. Pt. Is independent cutting meat with increased time. 20th visit: Left: 10#. Pt. Has difficulty positioning, and stabilizing the utensils while cutting meat. Goal status:  Achieved  4.  Pt. Will improve Left hand [facility] skills to be able to be able to independently, and efficiently manipulate small objects during ADL tasks. Baseline: Eval: left: 1 min. 9 sec. 10th visit: Left: 1 min. Pt.is improving with manipulating small objects. 11/07: Left: 1 min. & 4 sec. Pt. has difficulty manipulating small objects. 20th visit: Left:  1 min. Pt. is improving with grasping pills, however continues to have difficulty. 30th visit: Left:  1 min. Pt. Is improving with grasping pills, however continues to have difficulty. Goal status: Ongoing   5.  Pt. Will perform light meal preparation with modified independence Baseline: Eval: Pt. reports having difficulty performing light  meal preparation, and cooking tasks. 10th visit: Pt. is able to make toast in the morning, a sandwich,  use the microwave, make coffee, and pour herself a drink. 11/07: Pt. has difficulty holding a coffee pot when filling the reservoir 20th visit: Pt. Is making waffles in the toaster, heats items in the microwave. Pt. uses the oven when supervised. 30th visit:  Pt. continues to perform light meal prep, hand  heats in the microwave. Goal status: Ongoing  6.: Pt. will improve with left grip strength to be able to securely hold a drink.   Baseline: 30th visit: Left grip strength 13#. Pt. has difficulty holding a full drink  securely with the left hand.  Goal status: New  7. Pt. Will improve typing speed, and accuracy in preparation for efficiently typing simple email message.  Baseline: 30th visit: Typing net speed: 3 wpm, accuracy 48% for a 1 min. Typing test.  Goal status: New        CLINICAL IMPRESSION:  Pt has continued to make good progress, she demonstrates some joint tightness and mild pain with ROM greater than 100 degrees of motion.  Increased effort to reach top level of SAEBO tower in standing this date.  She benefits from short rest breaks.  She reports decreased pain and stiffness after engaging in UBE prior to other ROM tasks.  Continues to demonstrate difficulty with active reaching patterns to manage hair especially reaching to the left back side of her head. Pt demonstrates difficulty at times with managing playing cards and performing bilateral hand task of shuffling.  Noted differences in timing of movements and difficulty with simultaneous movements between left and right.  Continue to work towards goals in plan of care to maximize safety and independence in necessary daily tasks.          PERFORMANCE DEFICITS in functional skills including ADLs, IADLs, coordination, dexterity, sensation, ROM, strength, FMC, decreased knowledge of use of DME, vision, and UE functional use, cognitive skills including attention, and psychosocial skills including coping strategies, environmental adaptation, habits, and routines and behaviors.   IMPAIRMENTS are limiting patient from ADLs, IADLs, and social participation.   COMORBIDITIES may have co-morbidities  that affects occupational performance. Patient will benefit from skilled OT to address above impairments and improve overall  function.  MODIFICATION OR ASSISTANCE TO COMPLETE EVALUATION: Min-Moderate modification of tasks or assist with assess necessary to complete an evaluation.  OT OCCUPATIONAL PROFILE AND HISTORY: Detailed assessment: Review of records and additional review of physical, cognitive, psychosocial history related to current functional performance.  CLINICAL DECISION MAKING: Moderate - several treatment options, min-mod task modification necessary  REHAB POTENTIAL: Good  EVALUATION COMPLEXITY: Moderate    PLAN: OT FREQUENCY: 2x/week  OT DURATION: 12 weeks  PLANNED INTERVENTIONS: self care/ADL training, therapeutic exercise, therapeutic activity, neuromuscular re-education, manual therapy, passive range of motion, and paraffin  RECOMMENDED OTHER SERVICES: ST, and PT  CONSULTED AND AGREED WITH PLAN OF CARE: Patient  PLAN FOR NEXT SESSION:  L hand strengthening and coordination exercises  Cailean Heacock T Joscelyn Hardrick, OTR/L, CLT   Omunique Pederson, OT 08/07/2022, 9:19 AM

## 2022-08-08 ENCOUNTER — Encounter (HOSPITAL_COMMUNITY): Payer: Self-pay | Admitting: Physician Assistant

## 2022-08-08 ENCOUNTER — Other Ambulatory Visit (HOSPITAL_COMMUNITY): Payer: Self-pay | Admitting: Physician Assistant

## 2022-08-08 ENCOUNTER — Ambulatory Visit (HOSPITAL_COMMUNITY)
Admission: RE | Admit: 2022-08-08 | Discharge: 2022-08-08 | Disposition: A | Discharge status: Home / Self Care | Payer: Medicare HMO | Source: Ambulatory Visit | Attending: Physician Assistant | Admitting: Physician Assistant

## 2022-08-08 VITALS — BP 114/60 | HR 78 | Ht 62.0 in | Wt 189.6 lb

## 2022-08-08 DIAGNOSIS — I10 Essential (primary) hypertension: Secondary | ICD-10-CM | POA: Diagnosis not present

## 2022-08-08 DIAGNOSIS — E785 Hyperlipidemia, unspecified: Secondary | ICD-10-CM | POA: Insufficient documentation

## 2022-08-08 DIAGNOSIS — I493 Ventricular premature depolarization: Secondary | ICD-10-CM | POA: Insufficient documentation

## 2022-08-08 DIAGNOSIS — D6869 Other thrombophilia: Secondary | ICD-10-CM | POA: Diagnosis not present

## 2022-08-08 DIAGNOSIS — Z95818 Presence of other cardiac implants and grafts: Secondary | ICD-10-CM | POA: Diagnosis not present

## 2022-08-08 DIAGNOSIS — E669 Obesity, unspecified: Secondary | ICD-10-CM | POA: Insufficient documentation

## 2022-08-08 DIAGNOSIS — G4733 Obstructive sleep apnea (adult) (pediatric): Secondary | ICD-10-CM | POA: Diagnosis not present

## 2022-08-08 DIAGNOSIS — Z7984 Long term (current) use of oral hypoglycemic drugs: Secondary | ICD-10-CM | POA: Insufficient documentation

## 2022-08-08 DIAGNOSIS — I480 Paroxysmal atrial fibrillation: Secondary | ICD-10-CM | POA: Diagnosis not present

## 2022-08-08 DIAGNOSIS — E119 Type 2 diabetes mellitus without complications: Secondary | ICD-10-CM | POA: Diagnosis not present

## 2022-08-08 DIAGNOSIS — Z713 Dietary counseling and surveillance: Secondary | ICD-10-CM | POA: Insufficient documentation

## 2022-08-08 DIAGNOSIS — Z7982 Long term (current) use of aspirin: Secondary | ICD-10-CM | POA: Diagnosis not present

## 2022-08-08 DIAGNOSIS — Z8673 Personal history of transient ischemic attack (TIA), and cerebral infarction without residual deficits: Secondary | ICD-10-CM | POA: Insufficient documentation

## 2022-08-08 DIAGNOSIS — Z6834 Body mass index (BMI) 34.0-34.9, adult: Secondary | ICD-10-CM | POA: Diagnosis not present

## 2022-08-08 DIAGNOSIS — Z79899 Other long term (current) drug therapy: Secondary | ICD-10-CM | POA: Diagnosis not present

## 2022-08-08 LAB — CBC WITH DIFFERENTIAL/PLATELET
Basophils Absolute: 0.1 10*3/uL (ref 0.0–0.2)
Basos: 1 %
EOS (ABSOLUTE): 0.2 10*3/uL (ref 0.0–0.4)
Eos: 2 %
Hematocrit: 38.3 % (ref 34.0–46.6)
Hemoglobin: 12.7 g/dL (ref 11.1–15.9)
Immature Grans (Abs): 0 10*3/uL (ref 0.0–0.1)
Immature Granulocytes: 0 %
Lymphocytes Absolute: 2.2 10*3/uL (ref 0.7–3.1)
Lymphs: 23 %
MCH: 28.7 pg (ref 26.6–33.0)
MCHC: 33.2 g/dL (ref 31.5–35.7)
MCV: 87 fL (ref 79–97)
Monocytes Absolute: 0.5 10*3/uL (ref 0.1–0.9)
Monocytes: 6 %
Neutrophils Absolute: 6.6 10*3/uL (ref 1.4–7.0)
Neutrophils: 68 %
Platelets: 254 10*3/uL (ref 150–450)
RBC: 4.42 x10E6/uL (ref 3.77–5.28)
RDW: 13.1 % (ref 11.7–15.4)
WBC: 9.5 10*3/uL (ref 3.4–10.8)

## 2022-08-08 MED ORDER — APIXABAN 5 MG PO TABS: 5.0000 mg | ORAL_TABLET | Freq: Two times a day (BID) | ORAL | 3 refills | Status: DC

## 2022-08-08 NOTE — Progress Notes (Signed)
Primary Care Physician: [name], MD Primary Cardiologist: Dr Kirke Corin (remotely) Primary Electrophysiologist: Dr Ladona Ridgel Referring Physician: Dr Bartolo D[facility] clinic   Alyssa Mayo is a 77 y.o. female with a history of DM, HTN, HLD, OSA, atrial fibrillation who presents for follow up in the [facility].  The patient was initially diagnosed with atrial fibrillation 06/2022 on her ILR which was placed 07/2021 for cryptogenic stroke. The episode lasted 3-4 hours. She did feel palpitations in her back during that time. Patient has a CHADS2VASC score of 7. She denies significant alcohol use and is compliant with her CPAP therapy.   Today, she denies symptoms of palpitations, chest pain, shortness of breath, orthopnea, PND, lower extremity edema, dizziness, presyncope, syncope, snoring, daytime somnolence, bleeding. The patient is tolerating medications without difficulties and is otherwise without complaint today.    Atrial Fibrillation Risk Factors:  she does have symptoms or diagnosis of sleep apnea. she is compliant with CPAP therapy. she does not have a history of rheumatic fever. she does not have a history of alcohol use. The patient does not have a history of early familial atrial fibrillation or other arrhythmias.  she has a BMI of Body mass index is 34.68 kg/m.Marland Kitchen Filed Weights   08/08/22 1018  Weight: 86 kg    Family History  Problem Relation Age of Onset   Cancer Mother        breast   Heart disease Mother    Stroke Mother    Diabetes Mother    Colon cancer Mother    Cancer Father        leukemia   Stroke Father    Leukemia Father    Ovarian cancer Sister    Diabetes Brother    Cancer Brother    Stroke Maternal Grandfather    Dementia Paternal Grandmother    COPD Neg Hx    Hypertension Neg Hx      Atrial Fibrillation Management history:  Previous antiarrhythmic drugs: none Previous cardioversions: none Previous  ablations: none CHADS2VASC score: 7 Anticoagulation history: none   Past Medical History:  Diagnosis Date   Abnormal levels of other serum enzymes 07/17/2015   Arthritis    Constipation 07/11/2015   COVID-19 03/06/2021   Diabetes mellitus without complication (HCC)    Elevated liver enzymes    Fatty liver    Generalized abdominal pain 07/11/2015   Hyperlipidemia    Hypertension    Lifelong obesity    Macular degeneration    Morbid obesity due to excess calories (HCC) 07/11/2015   Other fatigue 07/11/2015   Sleep apnea    Spinal headache    Past Surgical History:  Procedure Laterality Date   ABDOMINAL HYSTERECTOMY     APPENDECTOMY     CATARACT EXTRACTION     CERVICAL LAMINECTOMY  07/21/1985   CESAREAN SECTION     COLONOSCOPY  07/21/2012   diverticulosis, [facility] Dr. [name]    COLONOSCOPY WITH PROPOFOL N/A 02/04/2019   Procedure: COLONOSCOPY WITH PROPOFOL;  Surgeon: [name], MD;  Location: [facility] ENDOSCOPY;  Service: Endoscopy;  Laterality: N/A;   COLONOSCOPY WITH PROPOFOL N/A 03/18/2021   Procedure: COLONOSCOPY WITH PROPOFOL;  Surgeon: [name], MD;  Location: [facility] ENDOSCOPY;  Service: Endoscopy;  Laterality: N/A;  DM   DIAGNOSTIC LAPAROSCOPY     ESOPHAGOGASTRODUODENOSCOPY (EGD) WITH PROPOFOL N/A 02/04/2019   Procedure: ESOPHAGOGASTRODUODENOSCOPY (EGD) WITH PROPOFOL;  Surgeon: [name], MD;  Location: [facility] ENDOSCOPY;  Service: Endoscopy;  Laterality:  N/A;   ESOPHAGOGASTRODUODENOSCOPY (EGD) WITH PROPOFOL N/A 03/18/2021   Procedure: ESOPHAGOGASTRODUODENOSCOPY (EGD) WITH PROPOFOL;  Surgeon: Lesly Rubenstein, MD;  Location: [facility] ENDOSCOPY;  Service: Endoscopy;  Laterality: N/A;   LOOP RECORDER INSERTION N/A 08/05/2021   Procedure: LOOP RECORDER INSERTION;  Surgeon: Evans Lance, MD;  Location: [facility];  Service: Cardiovascular;  Laterality: N/A;   SPINE SURGERY     TUBAL LIGATION      Current Outpatient Medications  Medication Sig  Dispense Refill   acetaminophen (TYLENOL) 325 MG tablet Take 2 tablets (650 mg total) by mouth every 4 (four) hours as needed for mild pain (or temp > 37.5 C (99.5 F)).     amLODipine (NORVASC) 2.5 MG tablet Take 1 tablet (2.5 mg total) by mouth daily. 30 tablet 0   aspirin EC 81 MG EC tablet Take 1 tablet (81 mg total) by mouth daily. Swallow whole. 30 tablet 11   celecoxib (CELEBREX) 200 MG capsule Take 200 mg by mouth 2 (two) times daily.     dapagliflozin propanediol (FARXIGA) 10 MG TABS tablet Take 10 mg by mouth daily. 90 tablet 1   gabapentin (NEURONTIN) 100 MG capsule Take 100 mg by mouth at bedtime. 2 tab     glipiZIDE (GLUCOTROL XL) 5 MG 24 hr tablet Take 1 tablet (5 mg total) by mouth 2 (two) times daily with a meal. 60 tablet 0   magnesium oxide (MAG-OX) 400 MG tablet Take 1 tablet (400 mg total) by mouth at bedtime. 30 tablet 0   metFORMIN (GLUCOPHAGE) 500 MG tablet Take 2 tablets (1,000 mg total) by mouth 2 (two) times daily. 120 tablet 0   Multiple Vitamins-Minerals (PRESERVISION AREDS 2) CAPS Take 1 capsule by mouth 2 (two) times daily.     pantoprazole (PROTONIX) 40 MG tablet Take 1 tablet (40 mg total) by mouth daily. 30 tablet 0   simvastatin (ZOCOR) 40 MG tablet Take 40 mg by mouth daily.     Vitamin D, Ergocalciferol, (DRISDOL) 1.25 MG (50000 UNIT) CAPS capsule Take 1 capsule (50,000 Units total) by mouth every 7 (seven) days. 5 capsule 0   No current facility-administered medications for this encounter.    Allergies  Allergen Reactions   Codeine Other (See Comments)    Makes her very loopy and groggy   Neosporin [Neomycin-Bacitracin Zn-Polymyx] Hives   Ozempic [Semaglutide] Hives   Polymyxin B Other (See Comments)    Eyes redness   Sulfa Antibiotics Itching    Social History   Socioeconomic History   Marital status: Widowed    Spouse name: Not on file   Number of children: Not on file   Years of education: Not on file   Highest education level: Bachelor's  degree (e.g., BA, AB, BS)  Occupational History   Not on file  Tobacco Use   Smoking status: Never   Smokeless tobacco: Never   Tobacco comments:    Never smoke 08/08/22  Vaping Use   Vaping Use: Never used  Substance and Sexual Activity   Alcohol use: Not Currently    Comment: none last 24 months   Drug use: No   Sexual activity: Not on file  Other Topics Concern   Not on file  Social History Narrative   Not on file   Social Determinants of Health   Financial Resource Strain: Low Risk  (12/10/2017)   Overall Financial Resource Strain (CARDIA)    Difficulty of Paying Living Expenses: Not hard at all  Food  Insecurity: No Food Insecurity (12/10/2017)   Hunger Vital Sign    Worried About Running Out of Food in the Last Year: Never true    Ran Out of Food in the Last Year: Never true  Transportation Needs: No Transportation Needs (12/10/2017)   PRAPARE - Hydrologist (Medical): No    Lack of Transportation (Non-Medical): No  Physical Activity: Inactive (12/10/2017)   Exercise Vital Sign    Days of Exercise per Week: 0 days    Minutes of Exercise per Session: 0 min  Stress: No Stress Concern Present (12/10/2017)   [place]    Feeling of Stress : Not at all  Social Connections: [place] (12/10/2017)   Social Connection and Isolation Panel [NHANES]    Frequency of Communication with Friends and Family: More than three times a week    Frequency of Social Gatherings with Friends and Family: More than three times a week    Attends Religious Services: More than 4 times per year    Active Member of [employer] or Organizations: Yes    Attends Music therapist: More than 4 times per year    Marital Status: Married  Human resources officer Violence: Not At Risk (12/10/2017)   Humiliation, Afraid, Rape, and Kick questionnaire    Fear of Current or Ex-Partner: No    Emotionally  Abused: No    Physically Abused: No    Sexually Abused: No     ROS- All systems are reviewed and negative except as per the HPI above.  Physical Exam: Vitals:   08/08/22 1018  BP: 114/60  Pulse: 78  Weight: 86 kg  Height: 5\' 2"  (1.575 m)    GEN- The patient is a well appearing elderly female, alert and oriented x 3 today.   Head- normocephalic, atraumatic Eyes-  Sclera clear, conjunctiva pink Ears- hearing intact Oropharynx- clear Neck- supple  Lungs- Clear to ausculation bilaterally, normal work of breathing Heart- Regular rate and rhythm, no murmurs, rubs or gallops  GI- soft, NT, ND, + BS Extremities- no clubbing, cyanosis, or edema MS- no significant deformity or atrophy Skin- no rash or lesion Psych- euthymic mood, full affect Neuro- LUE weakness  Wt Readings from Last 3 Encounters:  08/08/22 86 kg  07/04/22 88 kg  03/20/22 84.1 kg    EKG today demonstrates  SR, PVC Vent. rate 78 BPM PR interval 162 ms QRS duration 78 ms QT/QTcB 360/410 ms  Echo 08/02/21 demonstrated   1. Technically difficult study with poor echo windows. Patient declined  Definity contrast.   2. Left ventricular ejection fraction, by estimation, is 55 to 60%. The  left ventricle has normal function. Left ventricular endocardial border  not optimally defined to evaluate regional wall motion. Left ventricular  diastolic parameters are consistent  with Grade I diastolic dysfunction (impaired relaxation).   3. Right ventricular systolic function is normal. The right ventricular  size is normal. The RV wall appears thickened, however, this may be due to  a prominent fat pad.   4. The mitral valve is normal in structure. Trivial mitral valve  regurgitation.   5. The aortic valve is tricuspid. There is mild calcification of the  aortic valve. There is mild thickening of the aortic valve. Aortic valve  regurgitation is not visualized. Aortic valve sclerosis/calcification is  present, without  any evidence of  aortic stenosis.   6. The inferior vena cava is normal in size  with greater than 50%  respiratory variability, suggesting right atrial pressure of 3 mmHg.   Conclusion(s)/Recommendation(s): No intracardiac source of embolism  detected on this transthoracic study. Consider a transesophageal  echocardiogram to exclude cardiac source of embolism if clinically  indicated.   Epic records are reviewed at length today  CHA2DS2-VASc Score = 7  The patient's score is based upon: CHF History: 0 HTN History: 1 Diabetes History: 1 Stroke History: 2 Vascular Disease History: 0 Age Score: 2 Gender Score: 1       ASSESSMENT AND PLAN: 1. Paroxysmal Atrial Fibrillation (ICD10:  I48.0) The patient's CHA2DS2-VASc score is 7, indicating a 11.2% annual risk of stroke.   General education about afib provided and questions answered. We also discussed her stroke risk and the risks and benefits of anticoagulation. Will start Eliquis 5 mg BID. Check CBC Stop ASA. Patient will decrease celebrex, she does not take it every day. Continue to monitor burden on ILR.   2. Secondary Hypercoagulable State (ICD10:  D68.69) The patient is at significant risk for stroke/thromboembolism based upon her CHA2DS2-VASc Score of 7.  Start Apixaban (Eliquis).   3. Obesity Body mass index is 34.68 kg/m. Lifestyle modification was discussed at length including regular exercise and weight reduction.  4. Obstructive sleep apnea The importance of adequate treatment of sleep apnea was discussed today in order to improve our ability to maintain sinus rhythm long term. Encouraged compliance with CPAP therapy.  5. HTN Stable, no changes today.   Follow up in the AF clinic in one month.    [facility] [address] [place], [place] [address] [phone] 08/08/2022 10:33 AM

## 2022-08-08 NOTE — Patient Instructions (Signed)
STOP aspirin   START Eliquis 5mg  twice a day

## 2022-08-11 ENCOUNTER — Ambulatory Visit: Payer: Medicare HMO | Attending: Internal Medicine

## 2022-08-11 DIAGNOSIS — I639 Cerebral infarction, unspecified: Secondary | ICD-10-CM | POA: Diagnosis not present

## 2022-08-11 NOTE — Progress Notes (Signed)
[facility] Summary Report / Loop Recorder

## 2022-08-12 ENCOUNTER — Ambulatory Visit: Payer: Medicare HMO | Admitting: Speech Pathology

## 2022-08-12 ENCOUNTER — Ambulatory Visit: Payer: Medicare HMO | Admitting: Occupational Therapy

## 2022-08-12 DIAGNOSIS — M6281 Muscle weakness (generalized): Secondary | ICD-10-CM

## 2022-08-12 DIAGNOSIS — R41841 Cognitive communication deficit: Secondary | ICD-10-CM | POA: Diagnosis not present

## 2022-08-12 LAB — CUP PACEART REMOTE DEVICE CHECK
Date Time Interrogation Session: 20240119230529
Implantable Pulse Generator Implant Date: 20230116

## 2022-08-12 NOTE — Therapy (Signed)
OUTPATIENT OCCUPATIONAL THERAPY TREATMENT NOTE   Patient Name: Alyssa Mayo MRN: [id number] DOB:Jul 31, 1945, 77 y.o., female Today's Date: 03/14/2022  PCP: [name], MD REFERRING PROVIDER: [name], NP  OT End of Session - 08/12/22 1121     Visit Number 33    Number of Visits 48    Date for OT Re-Evaluation 10/23/22    Authorization Type Progress reporting period starting 06/19/2022    OT Start Time 1100    OT Stop Time 1145    OT Time Calculation (min) 45 min    Activity Tolerance Patient tolerated treatment well    Behavior During Therapy [facility] for tasks assessed/performed             Past Medical History:  Diagnosis Date   Abnormal levels of other serum enzymes 07/17/2015   Arthritis    Constipation 07/11/2015   COVID-19 03/06/2021   Diabetes mellitus without complication (HCC)    Elevated liver enzymes    Fatty liver    Generalized abdominal pain 07/11/2015   Hyperlipidemia    Hypertension    Lifelong obesity    Macular degeneration    Morbid obesity due to excess calories (HCC) 07/11/2015   Other fatigue 07/11/2015   Sleep apnea    Spinal headache    Past Surgical History:  Procedure Laterality Date   ABDOMINAL HYSTERECTOMY     APPENDECTOMY     CATARACT EXTRACTION     CERVICAL LAMINECTOMY  07/21/1985   CESAREAN SECTION     COLONOSCOPY  07/21/2012   diverticulosis, [facility] Dr. [name]    COLONOSCOPY WITH PROPOFOL N/A 02/04/2019   Procedure: COLONOSCOPY WITH PROPOFOL;  Surgeon: [name], MD;  Location: [facility] ENDOSCOPY;  Service: Endoscopy;  Laterality: N/A;   COLONOSCOPY WITH PROPOFOL N/A 03/18/2021   Procedure: COLONOSCOPY WITH PROPOFOL;  Surgeon: [name], MD;  Location: [facility] ENDOSCOPY;  Service: Endoscopy;  Laterality: N/A;  DM   DIAGNOSTIC LAPAROSCOPY     ESOPHAGOGASTRODUODENOSCOPY (EGD) WITH PROPOFOL N/A 02/04/2019   Procedure: ESOPHAGOGASTRODUODENOSCOPY (EGD) WITH PROPOFOL;  Surgeon: [name], MD;   Location: [facility] ENDOSCOPY;  Service: Endoscopy;  Laterality: N/A;   ESOPHAGOGASTRODUODENOSCOPY (EGD) WITH PROPOFOL N/A 03/18/2021   Procedure: ESOPHAGOGASTRODUODENOSCOPY (EGD) WITH PROPOFOL;  Surgeon: [name], MD;  Location: [facility] ENDOSCOPY;  Service: Endoscopy;  Laterality: N/A;   LOOP RECORDER INSERTION N/A 08/05/2021   Procedure: LOOP RECORDER INSERTION;  Surgeon: [name], MD;  Location: [facility] INVASIVE CV LAB;  Service: Cardiovascular;  Laterality: N/A;   SPINE SURGERY     TUBAL LIGATION     Patient Active Problem List   Diagnosis Date Noted   Paroxysmal atrial fibrillation (HCC) 08/08/2022   Hypercoagulable state due to paroxysmal atrial fibrillation (HCC) 08/08/2022   Cerebral edema (HCC) 08/07/2021   OSA (obstructive sleep apnea) 08/07/2021   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   CVA (cerebral vascular accident) (HCC) 08/01/2021   GERD (gastroesophageal reflux disease) 07/20/2019   Advanced care planning/counseling discussion 12/17/2016   Skin lesions, generalized 11/13/2016   Chronic right hip pain 06/17/2016   Vitamin D deficiency 09/26/2015   Diastasis recti 08/08/2015   Elevated serum GGT level 07/24/2015   Essential hypertension 07/17/2015   Fatty liver 07/17/2015   Elevated alkaline phosphatase level 07/17/2015   Elevated serum glutamic pyruvic transaminase (SGPT) level 07/17/2015   Abnormal finding on EKG 07/11/2015   Morbid obesity (HCC) 07/11/2015   Colon, diverticulosis 07/11/2015   Abdominal  wall hernia 07/11/2015   DM (diabetes mellitus), type 2 with neurological complications (HCC)    Hyperlipidemia     ONSET DATE: 08/01/2021  REFERRING DIAG: CVA  THERAPY DIAG:  Muscle weakness (generalized)  Rationale for Evaluation and Treatment Rehabilitation  SUBJECTIVE:   SUBJECTIVE STATEMENT:   Pt. Reports soreness in the left bicep following a tourniquet use when having blood drawn.    Pt accompanied by: self  PERTINENT  HISTORY:  Pt. is a 77 y.o. female who presents with a diagnosis of RMCA CVA infarction with hemorrhagic transformation. Pt. attended inpatient rehab from 08/07/2021-08/22/2021. Pt. received Home health therapy services this past spring.  Pt. has recently had an assessment through driver rehabilitation services at [facility] with recommendations for referrals for  outpatient OT/PT, and ST services. Pt. PMHx includes: HTN, Hyperlipidemia, Macular degeneration, DM, and Obesity.  PRECAUTIONS: None  WEIGHT BEARING RESTRICTIONS No  PAIN:  Are you having pain? 1/10 soreness, initially in the left bicep, No pain following OT treatment.   FALLS: Has patient fallen in last 6 months? Yes.  LIVING ENVIRONMENT: Lives with: lives with their family  Son  [name] Lives in: House/apartment Main living are on one floor Stairs:  2 steps to enter Has following equipment at home: Single point cane, Wheelchair (manual), shower chair, Grab bars, bed rail, and rubber mat.  PLOF: Independent  PATIENT GOALS: To be able to Drive, and cook again  OBJECTIVE:   HAND DOMINANCE: Right   ADLs: Overall ADLs:  Transfers/ambulation related to ADLs: Independent Eating: Independent Grooming: Pt. Fatigues with sustained BUE's in elevation for haircare. Especially with blow drying hair. UB Dressing: Independent pullover shirt, independent now with fastening a bra LB Dressing: Independent donning pants socks, and slide on shoes Toileting: Independent Bathing: Independent Tub Shower transfers: Walk-in shower Independent now   IADLs: Shopping: Needs to be accompanied to the grocery store. Light housekeeping: Does own laundry, laundry,  Meal Prep:  Difficulty with cooking Community mobility: Relies on son, and friend Medication management: Son sets up Mining engineer management: Manages monthly bills independently. Handwriting: No change from baseline  MOBILITY STATUS: Hx of falls out of bed   ACTIVITY  TOLERANCE: Activity tolerance:  10-20 min. Before rest break  FUNCTIONAL OUTCOME MEASURES: FOTO: 57  TR score: 62  UPPER EXTREMITY ROM     Active ROM Right eval Left eval Left 05/06/2022 Left 05/27/2022 Left 06/19/2022  Left 07/31/2022  Shoulder flexion WFL 96(110) 107(110) 109(120) 112(126) 606(301)  Shoulder abduction WFL 82(105) 86(105) 92(115) 94(115) 96(116)  Shoulder adduction        Shoulder extension        Shoulder internal rotation        Shoulder external rotation        Elbow flexion [facility] WFL  Elbow extension [facility] WFL  Wrist flexion WFL 44(60) 68(68) WFL WFL WFL  Wrist extension WFL 32(60) 66(66) WFL WFL WFL  Wrist ulnar deviation        Wrist radial deviation        Wrist pronation        Wrist supination        (Blank rows = not tested)   UPPER EXTREMITY MMT:     MMT Right eval Left eval Left 05/06/2022 Left 05/27/2022 Left 06/19/2022 Left 07/31/2022  Shoulder flexion 4+/5 3-/5 3+/5 3+/5 3+/5 3+/5  Shoulder abduction 4+/5 3-/5 3-/5 3+/5 3+/5 3+/5  Shoulder adduction  Shoulder extension        Shoulder internal rotation        Shoulder external rotation        Middle trapezius        Lower trapezius        Elbow flexion 4+/5 4-/5 4+/5 5/5 5/5 5/5  Elbow extension 4+/5 4-/5 4+/5 5/5 5/5 5/5  Wrist flexion 4+/5 3-/5 4/5 4+/5 4+/5 4+/5  Wrist extension 4+/5 3/5 4/5 4+/5 4+/5 4+/5  Wrist ulnar deviation        Wrist radial deviation        Wrist pronation        Wrist supination        (Blank rows = not tested)  HAND FUNCTION: Grip strength: Right: 30 lbs; Left: 13 lbs, Lateral pinch: Right: 15 lbs, Left: 8 lbs, and 3 point pinch: Right: 13 lbs, Left: 7 lbs  05/06/2022 Grip strength: Right: 30 lbs; Left: 14 lbs, Lateral pinch: Right: 15 lbs, Left: 10 lbs, and 3 point pinch: Right: 13 lbs, Left: 7 lbs  05/27/2022:  Grip strength: Right: 30 lbs; Left: 17 lbs, Lateral pinch: Right: 15 lbs, Left: 11 lbs, and 3  point pinch: Right: 13 lbs, Left: 7 lbs  06/19/2022:  Grip strength: Right: 30 lbs; Left: 18 lbs, Lateral pinch: Right: 15 lbs, Left:  10 lbs, and 3 point pinch: Right: 13 lbs, Left: 8 lbs  07/31/2022:   Grip strength: Right: 30 lbs; Left: 13 lbs, Lateral pinch: Right: 15 lbs, Left:  10 lbs, and 3 point pinch: Right: 13 lbs, Left: 10 lbs   COORDINATION:  9 Hole Peg test: Right: 26  sec; Left: 1 min. & 9 sec  05/06/2022 9 Hole Peg test: Right: 26  sec; Left: 1 min.  05/27/2022  9 Hole Peg test: Right: 26  sec; Left: 1 min. 4 sec.   06/19/2022  9 Hole Peg test: Right: 26  sec; Left: 1 min.   07/31/2022  9 Hole Peg test: Right: 26  sec; Left: 59 sec.    SENSATION: Light touch: WFL Proprioception: WFL  COGNITION: Overall cognitive status: Within functional limits for tasks assessed  VISION: Subjective report: Glasses. Just received a new prescription to increase bifocal strength Baseline vision: Macular Degeneration Visual history: macular degeneration  VISION ASSESSMENT: To be further assessed in functional context  Left sided Awareness    PERCEPTION: Limited left sided awareness   TODAY'S TREATMENT:  TODAY'S TREATMENT:    Therapeutic Ex.   Pt. worked on Left UE ROM scapular elevation depression, abduction/rotation, shoulder flexion, and abduction in sitting. Pt. Worked on left shoulder AAROM, and at the tabletop surface. Pt. Performed bilateral shoulder flexion with a 2# weight, and small circular motion. Pt. worked on [employer], and reciprocal motion using the UBE while seated for 8 min. with no resistance. Constant monitoring was provided with occasional support proximally at her left elbow.     PATIENT EDUCATION: Education details:  Simulated  Person educated: Patient Education method: verbal cues Education comprehension: verbalized understanding and pt does not plan to cook unless son is present.   HOME EXERCISE PROGRAM:  Continue with an  ongoing assessment of HEP needs    GOALS: Goals reviewed with patient? Yes   SHORT TERM GOALS: Target date:  04/15/2022      Pt. Will improve FOTO score by 2 points to reflect improved pt. perceived functional performance Baseline: FOTO: 57, TR score: 62 10th visit: FOTO: 65, 20th visit: FOTO 62,  TR score: 62, 30th visit: FOTO score: 60 Goal status: Achieved  LONG TERM GOALS: Target date: 10/23/2022    Pt. Will improve left UE strength by 2 mm grades to assist with repositioning her grandson on her lap. Baseline: Eval: left shoulder flexion: 3-/5, abduction 3-/5,  elbow flex/ext. 4-/5, wrist extension 3/5, wrist flexion 3-/5  10th visit: left shoulder flexion: 3+/5, abduction 3-+5,  elbow flex/ext. 4+/5, wrist extension 4/5, wrist flexion 4/5 05/27/2022: left shoulder flexion: 3+/5, abduction 3+/5,  elbow flex/ext. 5/5, wrist extension 4+/5, wrist flexion 4+/5  20th visit: left shoulder flexion: 3+/5, abduction 3+/5,  elbow flex/ext. 5/5, wrist extension 4+/5, wrist flexion 4+/5. Pt. can now hold her grandson grandson on her lap, and reposition him. Pt. Is unable to hold him in standing. 30th visit: left shoulder flexion: 3+/5, abduction 3+/5,  elbow flex/ext. 5/5, wrist extension 4+/5, wrist flexion 4+/5 Goal status: Ongoing  2.  Pt. will improve left grip strength by 5# to be able to independently hold and use a blow dryer, and brush for hair care. Baseline: Eval: Left grip strength 13#. Pt. has difficulty performing hair care, holding and using a brush, and blow dryer. 10th visit: Left grip 14#. Pt. Is now able to hold th e blow dryer for the sides, and top of head, not the back. 11/07: Left 17# Pt. Has difficulty blow drying then back of her head.20th visit: Left grip 18#. Pt. Is able to independently hold a brush, and blow dry her hair.  Goal status: Achieved  3.  Pt. Will improve left lateral pinch strength by 3# to be able to independently cut meat. Baseline: Eval: Left 8, Pt. Has  difficulty cutting meat. 10th: Left: 10#.  Pt. is able to cut tender meat, however is not as efficient 11/07: Left 11#. Pt. Is independent cutting meat with increased time. 20th visit: Left: 10#. Pt. Has difficulty positioning, and stabilizing the utensils while cutting meat. Goal status:  Achieved  4.  Pt. Will improve Left hand [facility] skills to be able to be able to independently, and efficiently manipulate small objects during ADL tasks. Baseline: Eval: left: 1 min. 9 sec. 10th visit: Left: 1 min. Pt.is improving with manipulating small objects. 11/07: Left: 1 min. & 4 sec. Pt. has difficulty manipulating small objects. 20th visit: Left:  1 min. Pt. is improving with grasping pills, however continues to have difficulty. 30th visit: Left:  1 min. Pt. Is improving with grasping pills, however continues to have difficulty. Goal status: Ongoing   5.  Pt. Will perform light meal preparation with modified independence Baseline: Eval: Pt. reports having difficulty performing light  meal preparation, and cooking tasks. 10th visit: Pt. is able to make toast in the morning, a sandwich,  use the microwave, make coffee, and pour herself a drink. 11/07: Pt. has difficulty holding a coffee pot when filling the reservoir 20th visit: Pt. Is making waffles in the toaster, heats items in the microwave. Pt. uses the oven when supervised. 30th visit: Pt. continues to perform light meal prep, hand heats in the microwave. Goal status: Ongoing  6.: Pt. will improve with left grip strength to be able to securely hold a drink.   Baseline: 30th visit: Left grip strength 13#. Pt. has difficulty holding a full drink  securely with the left hand.  Goal status: New  7. Pt. Will improve typing speed, and accuracy in preparation for efficiently typing simple email message.  Baseline: 30th visit: Typing net speed: 3 wpm, accuracy  48% for a 1 min. Typing test.  Goal status: New        CLINICAL IMPRESSION:  Pt. reports 1/10  pain/soreness initially in her left bicep, O/10 after OT treatment session. Pt. tolerated Ther. Ex. well today, and reports that her arm feels looser. Pt. Responded well to ROM, UBE, and reaching tasks. Pt. Continues to require support proximally at the left elbow with the proximal support gradually tapering off.  Pt. presented with less leaning to the right when reaching with the left UE. Pt. Requires moderate cues cues to avoid compensation proximally with  left shoulder hiking reaching with the left. Pt. continues to work on improving, and maximizing overall inde          PERFORMANCE DEFICITS in functional skills including ADLs, IADLs, coordination, dexterity, sensation, ROM, strength, FMC, decreased knowledge of use of DME, vision, and UE functional use, cognitive skills including attention, and psychosocial skills including coping strategies, environmental adaptation, habits, and routines and behaviors.   IMPAIRMENTS are limiting patient from ADLs, IADLs, and social participation.   COMORBIDITIES may have co-morbidities  that affects occupational performance. Patient will benefit from skilled OT to address above impairments and improve overall function.  MODIFICATION OR ASSISTANCE TO COMPLETE EVALUATION: Min-Moderate modification of tasks or assist with assess necessary to complete an evaluation.  OT OCCUPATIONAL PROFILE AND HISTORY: Detailed assessment: Review of records and additional review of physical, cognitive, psychosocial history related to current functional performance.  CLINICAL DECISION MAKING: Moderate - several treatment options, min-mod task modification necessary  REHAB POTENTIAL: Good  EVALUATION COMPLEXITY: Moderate    PLAN: OT FREQUENCY: 2x/week  OT DURATION: 12 weeks  PLANNED INTERVENTIONS: self care/ADL training, therapeutic exercise, therapeutic activity, neuromuscular re-education, manual therapy, passive range of motion, and paraffin  RECOMMENDED OTHER  SERVICES: ST, and PT  CONSULTED AND AGREED WITH PLAN OF CARE: Patient  PLAN FOR NEXT SESSION:  L hand strengthening and coordination exercises  Harrel Carina, MS, OTR/L

## 2022-08-14 ENCOUNTER — Ambulatory Visit: Payer: Medicare HMO

## 2022-08-14 ENCOUNTER — Ambulatory Visit: Payer: Medicare HMO | Admitting: Occupational Therapy

## 2022-08-14 ENCOUNTER — Encounter: Payer: Medicare HMO | Admitting: Speech Pathology

## 2022-08-14 DIAGNOSIS — R278 Other lack of coordination: Secondary | ICD-10-CM

## 2022-08-14 DIAGNOSIS — M6281 Muscle weakness (generalized): Secondary | ICD-10-CM

## 2022-08-14 DIAGNOSIS — R41841 Cognitive communication deficit: Secondary | ICD-10-CM

## 2022-08-14 DIAGNOSIS — R482 Apraxia: Secondary | ICD-10-CM

## 2022-08-14 DIAGNOSIS — I63511 Cerebral infarction due to unspecified occlusion or stenosis of right middle cerebral artery: Secondary | ICD-10-CM

## 2022-08-14 DIAGNOSIS — R2681 Unsteadiness on feet: Secondary | ICD-10-CM

## 2022-08-14 DIAGNOSIS — R262 Difficulty in walking, not elsewhere classified: Secondary | ICD-10-CM

## 2022-08-14 NOTE — Therapy (Signed)
OUTPATIENT PHYSICAL THERAPY NEURO TREATMENT    Patient Name: Alyssa Mayo MRN: [id number] DOB:16-Sep-1945, 78 y.o., female Today's Date: 08/14/2022   PCP: [place] Pink, MD REFERRING PROVIDER: Frann Rider, NP   PT End of Session - 08/14/22 1106     Visit Number 28    Number of Visits 21    Date for PT Re-Evaluation 09/16/22    Authorization Type [employer]    Authorization Time Period 04/02/2022-06/25/2022; 06/24/2022-09/16/2022    PT Start Time 1103    PT Stop Time 1145    PT Time Calculation (min) 42 min    Equipment Utilized During Treatment Gait belt    Activity Tolerance Patient tolerated treatment well                   Past Medical History:  Diagnosis Date   Abnormal levels of other serum enzymes 07/17/2015   Arthritis    Constipation 07/11/2015   COVID-19 03/06/2021   Diabetes mellitus without complication ([place])    Elevated liver enzymes    Fatty liver    Generalized abdominal pain 07/11/2015   Hyperlipidemia    Hypertension    Lifelong obesity    Macular degeneration    Morbid obesity due to excess calories ([place]) 07/11/2015   Other fatigue 07/11/2015   Sleep apnea    Spinal headache    Past Surgical History:  Procedure Laterality Date   ABDOMINAL HYSTERECTOMY     APPENDECTOMY     CATARACT EXTRACTION     CERVICAL LAMINECTOMY  07/21/1985   CESAREAN SECTION     COLONOSCOPY  07/21/2012   diverticulosis, [facility] Dr. Donnella Sham    COLONOSCOPY WITH PROPOFOL N/A 02/04/2019   Procedure: COLONOSCOPY WITH PROPOFOL;  Surgeon: Lollie Sails, MD;  Location: [facility] ENDOSCOPY;  Service: Endoscopy;  Laterality: N/A;   COLONOSCOPY WITH PROPOFOL N/A 03/18/2021   Procedure: COLONOSCOPY WITH PROPOFOL;  Surgeon: Lesly Rubenstein, MD;  Location: [facility] ENDOSCOPY;  Service: Endoscopy;  Laterality: N/A;  DM   DIAGNOSTIC LAPAROSCOPY     ESOPHAGOGASTRODUODENOSCOPY (EGD) WITH PROPOFOL N/A 02/04/2019   Procedure: ESOPHAGOGASTRODUODENOSCOPY (EGD) WITH PROPOFOL;   Surgeon: Lollie Sails, MD;  Location: [facility] ENDOSCOPY;  Service: Endoscopy;  Laterality: N/A;   ESOPHAGOGASTRODUODENOSCOPY (EGD) WITH PROPOFOL N/A 03/18/2021   Procedure: ESOPHAGOGASTRODUODENOSCOPY (EGD) WITH PROPOFOL;  Surgeon: Lesly Rubenstein, MD;  Location: [facility] ENDOSCOPY;  Service: Endoscopy;  Laterality: N/A;   LOOP RECORDER INSERTION N/A 08/05/2021   Procedure: LOOP RECORDER INSERTION;  Surgeon: Evans Lance, MD;  Location: [place] CV LAB;  Service: Cardiovascular;  Laterality: N/A;   [place]     TUBAL LIGATION     Patient Active Problem List   Diagnosis Date Noted   Paroxysmal atrial fibrillation ([place]) 08/08/2022   Hypercoagulable state due to paroxysmal atrial fibrillation ([place]) 08/08/2022   Cerebral edema ([place]) 08/07/2021   OSA (obstructive sleep apnea) 08/07/2021   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   CVA (cerebral vascular accident) ([place]) 08/01/2021   GERD (gastroesophageal reflux disease) 07/20/2019   Advanced care planning/counseling discussion 12/17/2016   Skin lesions, generalized 11/13/2016   Chronic right hip pain 06/17/2016   Vitamin D deficiency 09/26/2015   Diastasis recti 08/08/2015   Elevated serum GGT level 07/24/2015   Essential hypertension 07/17/2015   Fatty liver 07/17/2015   Elevated alkaline phosphatase level 07/17/2015   Elevated serum glutamic pyruvic transaminase (SGPT) level 07/17/2015   Abnormal finding on EKG 07/11/2015   Morbid obesity ([place]) 07/11/2015  Colon, diverticulosis 07/11/2015   Abdominal wall hernia 07/11/2015   DM (diabetes mellitus), type 2 with neurological complications [facility])    Hyperlipidemia     ONSET DATE: January 11th 2023  REFERRING DIAG:  I69.398,R26.9 (ICD-10-CM) - Gait disturbance, post-stroke  I63.511 (ICD-10-CM) - Right middle cerebral artery stroke (HCC)    THERAPY DIAG:  Muscle weakness (generalized)  Other lack of coordination  Right middle cerebral artery stroke  (HCC)  Cognitive communication deficit  Difficulty in walking, not elsewhere classified  Unsteadiness on feet  Apraxia  Rationale for Evaluation and Treatment Rehabilitation  SUBJECTIVE:  Pt states that she had a fall this morning where she landed on her L side.  She states that he son left out the computer mat upside down (with the prickly piece facing up) and she accidentally stepped on them and due to the pain in her foot, she kept trying to get off of the mat and tripped.  Pt states she was worried that her feet were bleeding, but they were fine.  Pt does note some pain in the L shoulder, but states it's not worse than normal.    Pt did have bloodwork taken on the L UE and she notes some increased pain from the tourniquet.  Pt is bruised on her L hand upon arrival to the clinic from where they took the blood.  Pt accompanied by: self; [name] her son also accompanying pt.   PERTINENT HISTORY: Per chart and confirmed by pt:  Pt is a 77 yo female with RMCA CVA infarction with hemorrhagic transformation 08/01/2021, with inpatient rehab 08/07/2021-08/22/2021 followed by [facility] therapy until June. Pt now currently being seen for outpatient OT and ST services. Pt reports limitations in stamina persist. Other PMH is significant for HTN, HLD, macular degeneration, DM, obesity, sleep apnea, arthritis, chronic R hip pain, vitamin D deficiency, diastasis recti, abdominal wall hernia, hx of abdominal hysterectomy, appendectomy, spine surgery (years ago).   PAIN:  Are you having pain?  0/10 Pain Location:     TODAY'S TREATMENT:   TherEx:   STS with 1 Airex pads on floor to increase hip flexion for increased challenge, x10  Seated hip adduction into yellow physioball, 2x15 Seated LAQ with hip adduction into yellow physioball, 2x15 Seated LAQ with 5# AW donned, 2x15 each LE Seated resisted marches with BTB resistance applied at feet, 2x15 each Standing hamstring curls with 5# AW donned,  2x15 Resisted ambulation around the gym x2 laps with 5# AW donned,     PATIENT EDUCATION: Education details: exam findings, indications, plan, HEP Person educated: Patient Education method: Programmer, multimedia, Demonstration, Verbal cues, and Handouts Education comprehension: verbalized understanding, returned demonstration, verbal cues required, and needs further education   HOME EXERCISE PROGRAM:  Access Code: [email or web address] URL: [email or web address] Date: 06/02/2022 Prepared by: Alvera Novel  Exercises - Standing Single Leg Stance with Counter Support  - 2 x daily - 7 x weekly - 1 sets - 10 reps - 10s  hold - Standing Marching  - 2 x daily - 7 x weekly - 2 sets - 20 reps - Side Stepping with Counter Support  - 2 x daily - 7 x weekly - 2 sets - 20 reps - Walking  - 2 x daily - 7 x weekly    GOALS:  Goals reviewed with patient? Yes  SHORT TERM GOALS: Target date: 09/25/2022  Patient will be independent in home exercise program to improve strength/mobility for better functional independence with ADLs. Baseline: initiated; Patient reports walking  but not always independent with progressive HEP. Will keep goal active to anticipate more balance ex for HEP.  Goal status: PROGRESSING   LONG TERM GOALS: Target date: 11/06/2022  Patient will increase FOTO score to equal to or greater than 65  to demonstrate improvement in mobility and quality of life.  Baseline: 57 06/24/2022= 61 Goal status: IN PROGRESS  2.  Patient (> 46 years old) will complete five times sit to stand test in < 15 seconds indicating an increased LE strength and improved balance. Baseline: 17 sec hands-free 05/21/2022 - 15.36 sec 06/24/2022 - 13.56 sec  Goal status: GOAL MET  3.  Patient will increase 10 meter walk test to >1.86m/s as to improve gait speed for better community ambulation and to reduce fall risk. Baseline: 0.66 m/s; 11/1:  0.85 m/s 06/24/2022= 0.97 m/s without an AD Goal status: IN  PROGRESS  4.  Patient will increase Berg Balance score by > 3 points to demonstrate decreased fall risk during functional activities. Baseline: 50/56 06/24/2022= 52/56 Goal status: IN PROGRESS  5.  Patient will increase six minute walk test distance to >1000 for progression to community ambulator and improve gait ability Baseline: 04/10/22: 62ft (pain onset at 5 minutes) 05/21/22: 1027 ft 06/23/2022= 940 feet - will keep goal active until patient able to consistently achieve > 1000 feet Goal status: IN PROGRESS    ASSESSMENT:  CLINICAL IMPRESSION:  Pt lethargic today and just weakened likely from the fall at her home this morning.  Pt still participated well with therapist and put forth good effort, just demonstrated fatigue with several of the exercises.  Pt advised to continue to perform LE exercises in order to improve overall strength and balance.   Pt will continue to benefit from skilled therapy to address remaining deficits in order to improve overall QoL and return to PLOF.      OBJECTIVE IMPAIRMENTS Abnormal gait, decreased activity tolerance, decreased balance, decreased coordination, decreased endurance, decreased mobility, difficulty walking, decreased strength, impaired sensation, impaired UE functional use, improper body mechanics, postural dysfunction, and pain.   ACTIVITY LIMITATIONS carrying, lifting, bending, standing, squatting, stairs, bed mobility, bathing, reach over head, hygiene/grooming, and locomotion level  PARTICIPATION LIMITATIONS: meal prep, cleaning, laundry, medication management, personal finances, driving, shopping, community activity, and yard work  PERSONAL FACTORS Age, Sex, Time since onset of injury/illness/exacerbation, and 3+ comorbidities: Other PMH is significant for HTN, HLD, macular degeneration, DM, obesity, sleep apnea, arthritis, chronic R hip pain, vitamin D deficiency, diastasis recti, abdominal wall hernia, hx of abdominal hysterectomy,  appendectomy, spine surgery (years ago).   are also affecting patient's functional outcome.   REHAB POTENTIAL: Good  CLINICAL DECISION MAKING: Evolving/moderate complexity  EVALUATION COMPLEXITY: Moderate  PLAN: PT FREQUENCY: 2x/week  PT DURATION: 12 weeks  PLANNED INTERVENTIONS: Therapeutic exercises, Therapeutic activity, Neuromuscular re-education, Balance training, Gait training, Patient/Family education, Self Care, Joint mobilization, Joint manipulation, Stair training, Vestibular training, Canalith repositioning, Orthotic/Fit training, DME instructions, Dry Needling, Electrical stimulation, Wheelchair mobility training, Spinal mobilization, Cryotherapy, Moist heat, Splintting, Taping, Traction, Ultrasound, Biofeedback, Manual therapy, and Re-evaluation  PLAN FOR NEXT SESSION:   LE strengthening: leg press/squats/etc. HEP compliance and introduce more cognitive related tasks.   Gwenlyn Saran, PT, DPT Physical Therapist- [facility]  08/14/22, 11:48 AM

## 2022-08-14 NOTE — Therapy (Addendum)
OUTPATIENT OCCUPATIONAL THERAPY TREATMENT NOTE   Patient Name: Alyssa Mayo MRN: [id number] DOB:[birth year], 77 y.o., female Today's Date: 03/14/2022  PCP: [name], MD REFERRING PROVIDER: [name], NP  OT End of Session - 08/14/22 1154     Visit Number 34    Number of Visits 48    Date for OT Re-Evaluation 10/23/22    Authorization Type Progress reporting period starting 06/19/2022    OT Start Time 1145    OT Stop Time 1230    OT Time Calculation (min) 45 min    Activity Tolerance Patient tolerated treatment well    Behavior During Therapy [facility] for tasks assessed/performed                 Past Medical History:  Diagnosis Date   Abnormal levels of other serum enzymes 07/17/2015   Arthritis    Constipation 07/11/2015   COVID-19 03/06/2021   Diabetes mellitus without complication (HCC)    Elevated liver enzymes    Fatty liver    Generalized abdominal pain 07/11/2015   Hyperlipidemia    Hypertension    Lifelong obesity    Macular degeneration    Morbid obesity due to excess calories (HCC) 07/11/2015   Other fatigue 07/11/2015   Sleep apnea    Spinal headache    Past Surgical History:  Procedure Laterality Date   ABDOMINAL HYSTERECTOMY     APPENDECTOMY     CATARACT EXTRACTION     CERVICAL LAMINECTOMY  07/21/1985   CESAREAN SECTION     COLONOSCOPY  07/21/2012   diverticulosis, [facility] Dr. [name]    COLONOSCOPY WITH PROPOFOL N/A 02/04/2019   Procedure: COLONOSCOPY WITH PROPOFOL;  Surgeon: [name], MD;  Location: [facility] ENDOSCOPY;  Service: Endoscopy;  Laterality: N/A;   COLONOSCOPY WITH PROPOFOL N/A 03/18/2021   Procedure: COLONOSCOPY WITH PROPOFOL;  Surgeon: [name], MD;  Location: [facility] ENDOSCOPY;  Service: Endoscopy;  Laterality: N/A;  DM   DIAGNOSTIC LAPAROSCOPY     ESOPHAGOGASTRODUODENOSCOPY (EGD) WITH PROPOFOL N/A 02/04/2019   Procedure: ESOPHAGOGASTRODUODENOSCOPY (EGD) WITH PROPOFOL;  Surgeon: [name],  MD;  Location: [facility] ENDOSCOPY;  Service: Endoscopy;  Laterality: N/A;   ESOPHAGOGASTRODUODENOSCOPY (EGD) WITH PROPOFOL N/A 03/18/2021   Procedure: ESOPHAGOGASTRODUODENOSCOPY (EGD) WITH PROPOFOL;  Surgeon: [name], MD;  Location: [facility] ENDOSCOPY;  Service: Endoscopy;  Laterality: N/A;   LOOP RECORDER INSERTION N/A 08/05/2021   Procedure: LOOP RECORDER INSERTION;  Surgeon: [name], MD;  Location: [facility] INVASIVE CV LAB;  Service: Cardiovascular;  Laterality: N/A;   SPINE SURGERY     TUBAL LIGATION     Patient Active Problem List   Diagnosis Date Noted   Paroxysmal atrial fibrillation (HCC) 08/08/2022   Hypercoagulable state due to paroxysmal atrial fibrillation (HCC) 08/08/2022   Cerebral edema (HCC) 08/07/2021   OSA (obstructive sleep apnea) 08/07/2021   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   CVA (cerebral vascular accident) (HCC) 08/01/2021   GERD (gastroesophageal reflux disease) 07/20/2019   Advanced care planning/counseling discussion 12/17/2016   Skin lesions, generalized 11/13/2016   Chronic right hip pain 06/17/2016   Vitamin D deficiency 09/26/2015   Diastasis recti 08/08/2015   Elevated serum GGT level 07/24/2015   Essential hypertension 07/17/2015   Fatty liver 07/17/2015   Elevated alkaline phosphatase level 07/17/2015   Elevated serum glutamic pyruvic transaminase (SGPT) level 07/17/2015   Abnormal finding on EKG 07/11/2015   Morbid obesity (HCC) 07/11/2015   Colon, diverticulosis  07/11/2015   Abdominal wall hernia 07/11/2015   DM (diabetes mellitus), type 2 with neurological complications (HCC)    Hyperlipidemia     ONSET DATE: 08/01/2021  REFERRING DIAG: CVA  THERAPY DIAG:  Muscle weakness (generalized)  Rationale for Evaluation and Treatment Rehabilitation  SUBJECTIVE:   SUBJECTIVE STATEMENT:   Pt. Reports soreness in the left bicep following a tourniquet use when having blood drawn.    Pt accompanied by: self  PERTINENT  HISTORY:  Pt. is a 77 y.o. female who presents with a diagnosis of RMCA CVA infarction with hemorrhagic transformation. Pt. attended inpatient rehab from 08/07/2021-08/22/2021. Pt. received Home health therapy services this past spring.  Pt. has recently had an assessment through driver rehabilitation services at [facility] with recommendations for referrals for  outpatient OT/PT, and ST services. Pt. PMHx includes: HTN, Hyperlipidemia, Macular degeneration, DM, and Obesity.  PRECAUTIONS: None  WEIGHT BEARING RESTRICTIONS No  PAIN:  Are you having pain? 2/10 soreness.   FALLS: Has patient fallen in last 6 months? Yes.  LIVING ENVIRONMENT: Lives with: lives with their family  Son  [name] Lives in: House/apartment Main living are on one floor Stairs:  2 steps to enter Has following equipment at home: Single point cane, Wheelchair (manual), shower chair, Grab bars, bed rail, and rubber mat.  PLOF: Independent  PATIENT GOALS: To be able to Drive, and cook again  OBJECTIVE:   HAND DOMINANCE: Right   ADLs: Overall ADLs:  Transfers/ambulation related to ADLs: Independent Eating: Independent Grooming: Pt. Fatigues with sustained BUE's in elevation for haircare. Especially with blow drying hair. UB Dressing: Independent pullover shirt, independent now with fastening a bra LB Dressing: Independent donning pants socks, and slide on shoes Toileting: Independent Bathing: Independent Tub Shower transfers: Walk-in shower Independent now   IADLs: Shopping: Needs to be accompanied to the grocery store. Light housekeeping: Does own laundry, laundry,  Meal Prep:  Difficulty with cooking Community mobility: Relies on son, and friend Medication management: Son sets up Mining engineer management: Manages monthly bills independently. Handwriting: No change from baseline  MOBILITY STATUS: Hx of falls out of bed   ACTIVITY TOLERANCE: Activity tolerance:  10-20 min. Before rest break  FUNCTIONAL  OUTCOME MEASURES: FOTO: 57  TR score: 62  UPPER EXTREMITY ROM     Active ROM Right eval Left eval Left 05/06/2022 Left 05/27/2022 Left 06/19/2022  Left 07/31/2022  Shoulder flexion WFL 96(110) 107(110) 109(120) 112(126) 409(811)  Shoulder abduction WFL 82(105) 86(105) 92(115) 94(115) 96(116)  Shoulder adduction        Shoulder extension        Shoulder internal rotation        Shoulder external rotation        Elbow flexion [facility] WFL  Elbow extension [facility] WFL  Wrist flexion WFL 44(60) 68(68) WFL WFL WFL  Wrist extension WFL 32(60) 66(66) WFL WFL WFL  Wrist ulnar deviation        Wrist radial deviation        Wrist pronation        Wrist supination        (Blank rows = not tested)   UPPER EXTREMITY MMT:     MMT Right eval Left eval Left 05/06/2022 Left 05/27/2022 Left 06/19/2022 Left 07/31/2022  Shoulder flexion 4+/5 3-/5 3+/5 3+/5 3+/5 3+/5  Shoulder abduction 4+/5 3-/5 3-/5 3+/5 3+/5 3+/5  Shoulder adduction        Shoulder extension  Shoulder internal rotation        Shoulder external rotation        Middle trapezius        Lower trapezius        Elbow flexion 4+/5 4-/5 4+/5 5/5 5/5 5/5  Elbow extension 4+/5 4-/5 4+/5 5/5 5/5 5/5  Wrist flexion 4+/5 3-/5 4/5 4+/5 4+/5 4+/5  Wrist extension 4+/5 3/5 4/5 4+/5 4+/5 4+/5  Wrist ulnar deviation        Wrist radial deviation        Wrist pronation        Wrist supination        (Blank rows = not tested)  HAND FUNCTION: Grip strength: Right: 30 lbs; Left: 13 lbs, Lateral pinch: Right: 15 lbs, Left: 8 lbs, and 3 point pinch: Right: 13 lbs, Left: 7 lbs  05/06/2022 Grip strength: Right: 30 lbs; Left: 14 lbs, Lateral pinch: Right: 15 lbs, Left: 10 lbs, and 3 point pinch: Right: 13 lbs, Left: 7 lbs  05/27/2022:  Grip strength: Right: 30 lbs; Left: 17 lbs, Lateral pinch: Right: 15 lbs, Left: 11 lbs, and 3 point pinch: Right: 13 lbs, Left: 7 lbs  06/19/2022:  Grip strength: Right:  30 lbs; Left: 18 lbs, Lateral pinch: Right: 15 lbs, Left:  10 lbs, and 3 point pinch: Right: 13 lbs, Left: 8 lbs  07/31/2022:   Grip strength: Right: 30 lbs; Left: 13 lbs, Lateral pinch: Right: 15 lbs, Left:  10 lbs, and 3 point pinch: Right: 13 lbs, Left: 10 lbs   COORDINATION:  9 Hole Peg test: Right: 26  sec; Left: 1 min. & 9 sec  05/06/2022 9 Hole Peg test: Right: 26  sec; Left: 1 min.  05/27/2022  9 Hole Peg test: Right: 26  sec; Left: 1 min. 4 sec.   06/19/2022  9 Hole Peg test: Right: 26  sec; Left: 1 min.   07/31/2022  9 Hole Peg test: Right: 26  sec; Left: 59 sec.    SENSATION: Light touch: WFL Proprioception: WFL  COGNITION: Overall cognitive status: Within functional limits for tasks assessed  VISION: Subjective report: Glasses. Just received a new prescription to increase bifocal strength Baseline vision: Macular Degeneration Visual history: macular degeneration  VISION ASSESSMENT: To be further assessed in functional context  Left sided Awareness    PERCEPTION: Limited left sided awareness   TODAY'S TREATMENT:   TODAY'S TREATMENT:    Therapeutic Ex.   Pt. worked on Left UE ROM shoulder flexion, and abduction in sitting. Pt. Worked on left shoulder AAROM, and at the tabletop surface. Pt. worked on [employer], and reciprocal motion using the UBE while seated for 8 min. with no resistance. Pt.   Neuromuscular re-education:  Pt. worked on [facility] [place] skills grasping 1" sticks.  Pt. worked on storing the objects in the palm, and translatory skills moving the items from the palm of the hand to the tip of the 2nd digit, and thumb.     PATIENT EDUCATION: Education details:  Simulated  Person educated: Patient Education method: verbal cues Education comprehension: verbalized understanding and pt does not plan to cook unless son is present.   HOME EXERCISE PROGRAM:  Continue with an ongoing assessment of HEP needs    GOALS: Goals reviewed  with patient? Yes   SHORT TERM GOALS: Target date:  04/15/2022      Pt. Will improve FOTO score by 2 points to reflect improved pt. perceived functional performance Baseline: FOTO: 57, TR score: 62  10th visit: FOTO: 65, 20th visit: FOTO 62, TR score: 62, 30th visit: FOTO score: 60 Goal status: Achieved  LONG TERM GOALS: Target date: 10/23/2022    Pt. Will improve left UE strength by 2 mm grades to assist with repositioning her grandson on her lap. Baseline: Eval: left shoulder flexion: 3-/5, abduction 3-/5,  elbow flex/ext. 4-/5, wrist extension 3/5, wrist flexion 3-/5  10th visit: left shoulder flexion: 3+/5, abduction 3-+5,  elbow flex/ext. 4+/5, wrist extension 4/5, wrist flexion 4/5 05/27/2022: left shoulder flexion: 3+/5, abduction 3+/5,  elbow flex/ext. 5/5, wrist extension 4+/5, wrist flexion 4+/5  20th visit: left shoulder flexion: 3+/5, abduction 3+/5,  elbow flex/ext. 5/5, wrist extension 4+/5, wrist flexion 4+/5. Pt. can now hold her grandson grandson on her lap, and reposition him. Pt. Is unable to hold him in standing. 30th visit: left shoulder flexion: 3+/5, abduction 3+/5,  elbow flex/ext. 5/5, wrist extension 4+/5, wrist flexion 4+/5 Goal status: Ongoing  2.  Pt. will improve left grip strength by 5# to be able to independently hold and use a blow dryer, and brush for hair care. Baseline: Eval: Left grip strength 13#. Pt. has difficulty performing hair care, holding and using a brush, and blow dryer. 10th visit: Left grip 14#. Pt. Is now able to hold th e blow dryer for the sides, and top of head, not the back. 11/07: Left 17# Pt. Has difficulty blow drying then back of her head.20th visit: Left grip 18#. Pt. Is able to independently hold a brush, and blow dry her hair.  Goal status: Achieved  3.  Pt. Will improve left lateral pinch strength by 3# to be able to independently cut meat. Baseline: Eval: Left 8, Pt. Has difficulty cutting meat. 10th: Left: 10#.  Pt. is able to cut  tender meat, however is not as efficient 11/07: Left 11#. Pt. Is independent cutting meat with increased time. 20th visit: Left: 10#. Pt. has difficulty positioning, and stabilizing the utensils while cutting meat. Goal status:  Achieved  4.  Pt. Will improve Left hand [facility] skills to be able to be able to independently, and efficiently manipulate small objects during ADL tasks. Baseline: Eval: left: 1 min. 9 sec. 10th visit: Left: 1 min. Pt.is improving with manipulating small objects. 11/07: Left: 1 min. & 4 sec. Pt. has difficulty manipulating small objects. 20th visit: Left:  1 min. Pt. is improving with grasping pills, however continues to have difficulty. 30th visit: Left:  1 min. Pt. Is improving with grasping pills, however continues to have difficulty. Goal status: Ongoing   5.  Pt. Will perform light meal preparation with modified independence Baseline: Eval: Pt. reports having difficulty performing light  meal preparation, and cooking tasks. 10th visit: Pt. is able to make toast in the morning, a sandwich,  use the microwave, make coffee, and pour herself a drink. 11/07: Pt. has difficulty holding a coffee pot when filling the reservoir 20th visit: Pt. Is making waffles in the toaster, heats items in the microwave. Pt. uses the oven when supervised. 30th visit: Pt. continues to perform light meal prep, hand heats in the microwave. Goal status: Ongoing  6.: Pt. will improve with left grip strength to be able to securely hold a drink.   Baseline: 30th visit: Left grip strength 13#. Pt. has difficulty holding a full drink  securely with the left hand.  Goal status: New  7. Pt. Will improve typing speed, and accuracy in preparation for efficiently typing simple email message.  Baseline:  30th visit: Typing net speed: 3 wpm, accuracy 48% for a 1 min. Typing test.  Goal status: New        CLINICAL IMPRESSION:  Pt. reports having had a fall this morning after repeatedly stepping on the  spikey side of an office chair floor mat. Pt. reports 2/10 left bicep soreness today which Pt. Attributes to pain from the tourniquet being too tight when having her blood drawn recently. Pt. tolerated Ther. Ex. well today, and reports that her arm feels looser. Pt. responded well to ROM, UBE, and reaching tasks. Pt. presents with difficulty performing translatory movements of the left hand moving the sticks. Pt. presents with limited Left shoulder AROM today. Pt. Requires cues for for redirection at times 2/2 becoming easily distracted. Pt. Requires moderate cues cues to avoid compensation proximally with  left shoulder hiking reaching with the left. Pt. continues to work on improving, and maximizing overall independence.         PERFORMANCE DEFICITS in functional skills including ADLs, IADLs, coordination, dexterity, sensation, ROM, strength, FMC, decreased knowledge of use of DME, vision, and UE functional use, cognitive skills including attention, and psychosocial skills including coping strategies, environmental adaptation, habits, and routines and behaviors.   IMPAIRMENTS are limiting patient from ADLs, IADLs, and social participation.   COMORBIDITIES may have co-morbidities  that affects occupational performance. Patient will benefit from skilled OT to address above impairments and improve overall function.  MODIFICATION OR ASSISTANCE TO COMPLETE EVALUATION: Min-Moderate modification of tasks or assist with assess necessary to complete an evaluation.  OT OCCUPATIONAL PROFILE AND HISTORY: Detailed assessment: Review of records and additional review of physical, cognitive, psychosocial history related to current functional performance.  CLINICAL DECISION MAKING: Moderate - several treatment options, min-mod task modification necessary  REHAB POTENTIAL: Good  EVALUATION COMPLEXITY: Moderate    PLAN: OT FREQUENCY: 2x/week  OT DURATION: 12 weeks  PLANNED INTERVENTIONS: self care/ADL  training, therapeutic exercise, therapeutic activity, neuromuscular re-education, manual therapy, passive range of motion, and paraffin  RECOMMENDED OTHER SERVICES: ST, and PT  CONSULTED AND AGREED WITH PLAN OF CARE: Patient  PLAN FOR NEXT SESSION:  L hand strengthening and coordination exercises  Harrel Carina, MS, OTR/L

## 2022-08-19 ENCOUNTER — Encounter: Payer: Medicare HMO | Admitting: Speech Pathology

## 2022-08-19 ENCOUNTER — Ambulatory Visit: Payer: Medicare HMO | Admitting: Occupational Therapy

## 2022-08-19 DIAGNOSIS — R41841 Cognitive communication deficit: Secondary | ICD-10-CM | POA: Diagnosis not present

## 2022-08-19 DIAGNOSIS — M6281 Muscle weakness (generalized): Secondary | ICD-10-CM

## 2022-08-19 NOTE — Therapy (Signed)
OUTPATIENT OCCUPATIONAL THERAPY TREATMENT NOTE   Patient Name: Alyssa Mayo MRN: [id number] DOB:07/27/1945, 77 y.o., female Today's Date: 03/14/2022  PCP: [name], MD REFERRING PROVIDER: [name], NP  OT End of Session - 08/19/22 1052     Visit Number 35    Number of Visits 48    Date for OT Re-Evaluation 10/23/22    Authorization Type Progress reporting period starting 06/19/2022    OT Start Time 1045    OT Stop Time 1130    OT Time Calculation (min) 45 min    Activity Tolerance Patient tolerated treatment well    Behavior During Therapy [facility] for tasks assessed/performed                 Past Medical History:  Diagnosis Date   Abnormal levels of other serum enzymes 07/17/2015   Arthritis    Constipation 07/11/2015   COVID-19 03/06/2021   Diabetes mellitus without complication (HCC)    Elevated liver enzymes    Fatty liver    Generalized abdominal pain 07/11/2015   Hyperlipidemia    Hypertension    Lifelong obesity    Macular degeneration    Morbid obesity due to excess calories (HCC) 07/11/2015   Other fatigue 07/11/2015   Sleep apnea    Spinal headache    Past Surgical History:  Procedure Laterality Date   ABDOMINAL HYSTERECTOMY     APPENDECTOMY     CATARACT EXTRACTION     CERVICAL LAMINECTOMY  07/21/1985   CESAREAN SECTION     COLONOSCOPY  07/21/2012   diverticulosis, [facility] Dr. [name]    COLONOSCOPY WITH PROPOFOL N/A 02/04/2019   Procedure: COLONOSCOPY WITH PROPOFOL;  Surgeon: [name], MD;  Location[facility] ENDOSCOPY;  Service: Endoscopy;  Laterality: N/A;   COLONOSCOPY WITH PROPOFOL N/A 03/18/2021   Procedure: COLONOSCOPY WITH PROPOFOL;  Surgeon: [name], MD;  Location: [facility] ENDOSCOPY;  Service: Endoscopy;  Laterality: N/A;  DM   DIAGNOSTIC LAPAROSCOPY     ESOPHAGOGASTRODUODENOSCOPY (EGD) WITH PROPOFOL N/A 02/04/2019   Procedure: ESOPHAGOGASTRODUODENOSCOPY (EGD) WITH PROPOFOL;  Surgeon: [name],  MD;  Location: [facility] ENDOSCOPY;  Service: Endoscopy;  Laterality: N/A;   ESOPHAGOGASTRODUODENOSCOPY (EGD) WITH PROPOFOL N/A 03/18/2021   Procedure: ESOPHAGOGASTRODUODENOSCOPY (EGD) WITH PROPOFOL;  Surgeon: [name], MD;  Location: [facility] ENDOSCOPY;  Service: Endoscopy;  Laterality: N/A;   LOOP RECORDER INSERTION N/A 08/05/2021   Procedure: LOOP RECORDER INSERTION;  Surgeon: [name], MD;  Location: [facility] INVASIVE CV LAB;  Service: Cardiovascular;  Laterality: N/A;   SPINE SURGERY     TUBAL LIGATION     Patient Active Problem List   Diagnosis Date Noted   Paroxysmal atrial fibrillation (HCC) 08/08/2022   Hypercoagulable state due to paroxysmal atrial fibrillation (HCC) 08/08/2022   Cerebral edema (HCC) 08/07/2021   OSA (obstructive sleep apnea) 08/07/2021   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   CVA (cerebral vascular accident) (HCC) 08/01/2021   GERD (gastroesophageal reflux disease) 07/20/2019   Advanced care planning/counseling discussion 12/17/2016   Skin lesions, generalized 11/13/2016   Chronic right hip pain 06/17/2016   Vitamin D deficiency 09/26/2015   Diastasis recti 08/08/2015   Elevated serum GGT level 07/24/2015   Essential hypertension 07/17/2015   Fatty liver 07/17/2015   Elevated alkaline phosphatase level 07/17/2015   Elevated serum glutamic pyruvic transaminase (SGPT) level 07/17/2015   Abnormal finding on EKG 07/11/2015   Morbid obesity (HCC) 07/11/2015   Colon, diverticulosis  07/11/2015   Abdominal wall hernia 07/11/2015   DM (diabetes mellitus), type 2 with neurological complications ([place])    Hyperlipidemia     ONSET DATE: 08/01/2021  REFERRING DIAG: CVA  THERAPY DIAG:  Muscle weakness (generalized)  Rationale for Evaluation and Treatment Rehabilitation  SUBJECTIVE:   SUBJECTIVE STATEMENT:    Pt. reports having had blood drawn today, however reports she had them take it in the right arm instead of the left.   Pt  accompanied by: self  PERTINENT HISTORY:  Pt. is a 77 y.o. female who presents with a diagnosis of RMCA CVA infarction with hemorrhagic transformation. Pt. attended inpatient rehab from 08/07/2021-08/22/2021. Pt. received Home health therapy services this past spring.  Pt. has recently had an assessment through driver rehabilitation services at [facility] with recommendations for referrals for  outpatient OT/PT, and ST services. Pt. PMHx includes: HTN, Hyperlipidemia, Macular degeneration, DM, and Obesity.  PRECAUTIONS: None  WEIGHT BEARING RESTRICTIONS No  PAIN:  Are you having pain? No   FALLS: Has patient fallen in last 6 months? Yes.  LIVING ENVIRONMENT: Lives with: lives with their family  Son  [name] Lives in: House/apartment Main living are on one floor Stairs:  2 steps to enter Has following equipment at home: Single point cane, Wheelchair (manual), shower chair, Grab bars, bed rail, and rubber mat.  PLOF: Independent  PATIENT GOALS: To be able to Drive, and cook again  OBJECTIVE:   HAND DOMINANCE: Right   ADLs: Overall ADLs:  Transfers/ambulation related to ADLs: Independent Eating: Independent Grooming: Pt. Fatigues with sustained BUE's in elevation for haircare. Especially with blow drying hair. UB Dressing: Independent pullover shirt, independent now with fastening a bra LB Dressing: Independent donning pants socks, and slide on shoes Toileting: Independent Bathing: Independent Tub Shower transfers: Walk-in shower Independent now   IADLs: Shopping: Needs to be accompanied to the grocery store. Light housekeeping: Does own laundry, laundry,  Meal Prep:  Difficulty with cooking Community mobility: Relies on son, and friend Medication management: Son sets up Building surveyor management: Manages monthly bills independently. Handwriting: No change from baseline  MOBILITY STATUS: Hx of falls out of bed   ACTIVITY TOLERANCE: Activity tolerance:  10-20 min. Before rest  break  FUNCTIONAL OUTCOME MEASURES: FOTO: 57  TR score: 62  UPPER EXTREMITY ROM     Active ROM Right eval Left eval Left 05/06/2022 Left 05/27/2022 Left 06/19/2022  Left 07/31/2022  Shoulder flexion WFL 96(110) 107(110) 109(120) 112(126) 578(469)  Shoulder abduction WFL 82(105) 86(105) 92(115) 94(115) 96(116)  Shoulder adduction        Shoulder extension        Shoulder internal rotation        Shoulder external rotation        Elbow flexion [facility] WFL  Elbow extension [facility] WFL  Wrist flexion WFL 44(60) 68(68) WFL WFL WFL  Wrist extension WFL 32(60) 66(66) WFL WFL WFL  Wrist ulnar deviation        Wrist radial deviation        Wrist pronation        Wrist supination        (Blank rows = not tested)   UPPER EXTREMITY MMT:     MMT Right eval Left eval Left 05/06/2022 Left 05/27/2022 Left 06/19/2022 Left 07/31/2022  Shoulder flexion 4+/5 3-/5 3+/5 3+/5 3+/5 3+/5  Shoulder abduction 4+/5 3-/5 3-/5 3+/5 3+/5 3+/5  Shoulder adduction  Shoulder extension        Shoulder internal rotation        Shoulder external rotation        Middle trapezius        Lower trapezius        Elbow flexion 4+/5 4-/5 4+/5 5/5 5/5 5/5  Elbow extension 4+/5 4-/5 4+/5 5/5 5/5 5/5  Wrist flexion 4+/5 3-/5 4/5 4+/5 4+/5 4+/5  Wrist extension 4+/5 3/5 4/5 4+/5 4+/5 4+/5  Wrist ulnar deviation        Wrist radial deviation        Wrist pronation        Wrist supination        (Blank rows = not tested)  HAND FUNCTION: Grip strength: Right: 30 lbs; Left: 13 lbs, Lateral pinch: Right: 15 lbs, Left: 8 lbs, and 3 point pinch: Right: 13 lbs, Left: 7 lbs  05/06/2022 Grip strength: Right: 30 lbs; Left: 14 lbs, Lateral pinch: Right: 15 lbs, Left: 10 lbs, and 3 point pinch: Right: 13 lbs, Left: 7 lbs  05/27/2022:  Grip strength: Right: 30 lbs; Left: 17 lbs, Lateral pinch: Right: 15 lbs, Left: 11 lbs, and 3 point pinch: Right: 13 lbs, Left: 7  lbs  06/19/2022:  Grip strength: Right: 30 lbs; Left: 18 lbs, Lateral pinch: Right: 15 lbs, Left:  10 lbs, and 3 point pinch: Right: 13 lbs, Left: 8 lbs  07/31/2022:   Grip strength: Right: 30 lbs; Left: 13 lbs, Lateral pinch: Right: 15 lbs, Left:  10 lbs, and 3 point pinch: Right: 13 lbs, Left: 10 lbs   COORDINATION:  9 Hole Peg test: Right: 26  sec; Left: 1 min. & 9 sec  05/06/2022 9 Hole Peg test: Right: 26  sec; Left: 1 min.  05/27/2022  9 Hole Peg test: Right: 26  sec; Left: 1 min. 4 sec.   06/19/2022  9 Hole Peg test: Right: 26  sec; Left: 1 min.   07/31/2022  9 Hole Peg test: Right: 26  sec; Left: 59 sec.    SENSATION: Light touch: WFL Proprioception: WFL  COGNITION: Overall cognitive status: Within functional limits for tasks assessed  VISION: Subjective report: Glasses. Just received a new prescription to increase bifocal strength Baseline vision: Macular Degeneration Visual history: macular degeneration  VISION ASSESSMENT: To be further assessed in functional context  Left sided Awareness    PERCEPTION: Limited left sided awareness   TODAY'S TREATMENT:   TODAY'S TREATMENT:    Self-care:  Pt. worked on [employer]. Pt. Worked on lists of words formulated with the left side of the keyboard. Pt. Then worked on Publishing rights manager sentences, from a sheet of paper, followed by creating, and typing a simple email message. Emphasis was placed on engaging her left hand during the task.   PATIENT EDUCATION: Education details:  Simulated  Person educated: Patient Education method: verbal cues Education comprehension: verbalized understanding and pt does not plan to cook unless son is present.   HOME EXERCISE PROGRAM:  Continue with an ongoing assessment of HEP needs    GOALS: Goals reviewed with patient? Yes   SHORT TERM GOALS: Target date:  04/15/2022      Pt. Will improve FOTO score by 2 points to reflect improved pt. perceived functional  performance Baseline: FOTO: 57, TR score: 62 10th visit: FOTO: 65, 20th visit: FOTO 62, TR score: 62, 30th visit: FOTO score: 60 Goal status: Achieved  LONG TERM GOALS: Target date: 10/23/2022    Pt. Will improve  left UE strength by 2 mm grades to assist with repositioning her grandson on her lap. Baseline: Eval: left shoulder flexion: 3-/5, abduction 3-/5,  elbow flex/ext. 4-/5, wrist extension 3/5, wrist flexion 3-/5  10th visit: left shoulder flexion: 3+/5, abduction 3-+5,  elbow flex/ext. 4+/5, wrist extension 4/5, wrist flexion 4/5 05/27/2022: left shoulder flexion: 3+/5, abduction 3+/5,  elbow flex/ext. 5/5, wrist extension 4+/5, wrist flexion 4+/5  20th visit: left shoulder flexion: 3+/5, abduction 3+/5,  elbow flex/ext. 5/5, wrist extension 4+/5, wrist flexion 4+/5. Pt. can now hold her grandson grandson on her lap, and reposition him. Pt. Is unable to hold him in standing. 30th visit: left shoulder flexion: 3+/5, abduction 3+/5,  elbow flex/ext. 5/5, wrist extension 4+/5, wrist flexion 4+/5 Goal status: Ongoing  2.  Pt. will improve left grip strength by 5# to be able to independently hold and use a blow dryer, and brush for hair care. Baseline: Eval: Left grip strength 13#. Pt. has difficulty performing hair care, holding and using a brush, and blow dryer. 10th visit: Left grip 14#. Pt. Is now able to hold th e blow dryer for the sides, and top of head, not the back. 11/07: Left 17# Pt. Has difficulty blow drying then back of her head.20th visit: Left grip 18#. Pt. Is able to independently hold a brush, and blow dry her hair.  Goal status: Achieved  3.  Pt. Will improve left lateral pinch strength by 3# to be able to independently cut meat. Baseline: Eval: Left 8, Pt. Has difficulty cutting meat. 10th: Left: 10#.  Pt. is able to cut tender meat, however is not as efficient 11/07: Left 11#. Pt. Is independent cutting meat with increased time. 20th visit: Left: 10#. Pt. has difficulty  positioning, and stabilizing the utensils while cutting meat. Goal status:  Achieved  4.  Pt. Will improve Left hand [facility] skills to be able to be able to independently, and efficiently manipulate small objects during ADL tasks. Baseline: Eval: left: 1 min. 9 sec. 10th visit: Left: 1 min. Pt.is improving with manipulating small objects. 11/07: Left: 1 min. & 4 sec. Pt. has difficulty manipulating small objects. 20th visit: Left:  1 min. Pt. is improving with grasping pills, however continues to have difficulty. 30th visit: Left:  1 min. Pt. Is improving with grasping pills, however continues to have difficulty. Goal status: Ongoing   5.  Pt. Will perform light meal preparation with modified independence Baseline: Eval: Pt. reports having difficulty performing light  meal preparation, and cooking tasks. 10th visit: Pt. is able to make toast in the morning, a sandwich,  use the microwave, make coffee, and pour herself a drink. 11/07: Pt. has difficulty holding a coffee pot when filling the reservoir 20th visit: Pt. Is making waffles in the toaster, heats items in the microwave. Pt. uses the oven when supervised. 30th visit: Pt. continues to perform light meal prep, hand heats in the microwave. Goal status: Ongoing  6.: Pt. will improve with left grip strength to be able to securely hold a drink.   Baseline: 30th visit: Left grip strength 13#. Pt. has difficulty holding a full drink  securely with the left hand.  Goal status: New  7. Pt. Will improve typing speed, and accuracy in preparation for efficiently typing simple email message.  Baseline: 30th visit: Typing net speed: 3 wpm, accuracy 48% for a 1 min. Typing test.  Goal status: New        CLINICAL IMPRESSION:  Pt. reports that  her left bicep is finally feeling better. Pt. Reports that she had a cognitive assessment performed recently. Pt. Reports being concern that she mixed up hearing state, and date even though she had her hearing aides  in. Pt. Reports the she will receive the results next week. Pt. presents with difficulty isolating 4th, and 5th digit extension required to consistently lift the fingers off of the keys causing the letters to be repeated multiple times in the series. Pt. presented with multiple errors, mostly with letters requiring the 4th, and 5th digits to be used. Pt. Needs continued work on improving left hand function during typing, and formulating a simple email draft.           PERFORMANCE DEFICITS in functional skills including ADLs, IADLs, coordination, dexterity, sensation, ROM, strength, FMC, decreased knowledge of use of DME, vision, and UE functional use, cognitive skills including attention, and psychosocial skills including coping strategies, environmental adaptation, habits, and routines and behaviors.   IMPAIRMENTS are limiting patient from ADLs, IADLs, and social participation.   COMORBIDITIES may have co-morbidities  that affects occupational performance. Patient will benefit from skilled OT to address above impairments and improve overall function.  MODIFICATION OR ASSISTANCE TO COMPLETE EVALUATION: Min-Moderate modification of tasks or assist with assess necessary to complete an evaluation.  OT OCCUPATIONAL PROFILE AND HISTORY: Detailed assessment: Review of records and additional review of physical, cognitive, psychosocial history related to current functional performance.  CLINICAL DECISION MAKING: Moderate - several treatment options, min-mod task modification necessary  REHAB POTENTIAL: Good  EVALUATION COMPLEXITY: Moderate    PLAN: OT FREQUENCY: 2x/week  OT DURATION: 12 weeks  PLANNED INTERVENTIONS: self care/ADL training, therapeutic exercise, therapeutic activity, neuromuscular re-education, manual therapy, passive range of motion, and paraffin  RECOMMENDED OTHER SERVICES: ST, and PT  CONSULTED AND AGREED WITH PLAN OF CARE: Patient  PLAN FOR NEXT SESSION:  L hand  strengthening and coordination exercises  Harrel Carina, MS, OTR/L

## 2022-08-21 ENCOUNTER — Encounter: Payer: Medicare HMO | Admitting: Speech Pathology

## 2022-08-21 ENCOUNTER — Ambulatory Visit: Payer: Medicare HMO | Attending: Adult Health | Admitting: Occupational Therapy

## 2022-08-21 DIAGNOSIS — I63511 Cerebral infarction due to unspecified occlusion or stenosis of right middle cerebral artery: Secondary | ICD-10-CM | POA: Diagnosis present

## 2022-08-21 DIAGNOSIS — M542 Cervicalgia: Secondary | ICD-10-CM | POA: Insufficient documentation

## 2022-08-21 DIAGNOSIS — R41841 Cognitive communication deficit: Secondary | ICD-10-CM | POA: Insufficient documentation

## 2022-08-21 DIAGNOSIS — R262 Difficulty in walking, not elsewhere classified: Secondary | ICD-10-CM | POA: Diagnosis present

## 2022-08-21 DIAGNOSIS — R278 Other lack of coordination: Secondary | ICD-10-CM | POA: Diagnosis present

## 2022-08-21 DIAGNOSIS — M6281 Muscle weakness (generalized): Secondary | ICD-10-CM | POA: Diagnosis present

## 2022-08-21 DIAGNOSIS — R2681 Unsteadiness on feet: Secondary | ICD-10-CM | POA: Insufficient documentation

## 2022-08-21 NOTE — Progress Notes (Signed)
[facility] [address] [place]. [place] [address] [phone]       STROKE FOLLOW UP NOTE  [name] Date of Birth:  02/19/1946 Medical Record Number:  [id number]   Reason for Referral: stroke follow up    SUBJECTIVE:   CHIEF COMPLAINT:  Chief Complaint  Patient presents with   Follow-up    Patient in room #8 with her son (grey). Patient doesn't have any concern for her appt today.    HPI:   Update 08/25/2022 JM: Patient returns for 27-month stroke follow-up accompanied by her son.  Does have short-term memory loss since her stroke but son concerned this has been worsening especially over the past month. Can fluctuate day to day.  Was seen by PCP 1/29, completed lab work which was unremarkable. Was having difficulty sleeping but this has improved over the past 3 weeks. Does have OSA but unable to use CPAP over the past several weeks as machine not working correctly. Feels her appetite is good. Mainly watches TV during the day. Does not do any routine memory exercises, completed SLP about 1 month ago as met maximal rehab potential. Has been doing chair yoga for 20-30 minutes most nights. Continues working with PT/OT for residual left-sided deficits and gait impairment, does feel some improvement since prior visit. Denies new stroke/TIA symptoms.   ILR showed evidence of A-fib in 06/2022, was placed on Eliquis and stop aspirin.  Remains on Eliquis without side effects as well as atorvastatin. Blood pressure 139/73    History provided for reference purposes only Update 02/20/2022 JM: Patient returns for 46-month stroke follow-up accompanied by her son.  Has been stable without new stroke/TIA symptoms.  Reports residual mild LUE weakness, occasional imbalance/gait unsteadiness but no recent falls, some slurred speech, and mild memory impairment with impaired attention/concentration - reports improvement since prior visit Completed HH PT/OT last week.   Plans on participating in a local gym for continued exercises.  Does have driving simulation test scheduled Monday. Son is still staying with her. Able to maintain majority of ADLs independently but does need assistance for IADLs.  Compliant on aspirin and atorvastatin, denies side effects Blood pressure today 148/60 - was being monitored by therapies and had been stable Loop recorder has not shown atrial fibrillation thus far Routinely monitored by PCP Dr. Kary Kos - plans on repeat labs 03/2022  Does have sleep apnea, has CPAP machine but reports it has not been working.  She has not yet reached out to DME company.  No further concerns at this time.  Initial visit 10/16/2021 JM: Patient being seen for initial hospital follow-up accompanied by her son, Alyssa Mayo.  Doing well since discharge reporting residual left arm weakness, imbalance, occasional slurred speech and mild short term memory impairment but overall improving. She also mentions persistent generalized cold sensation and fatigued quickly.  Currently ambulating with a cane, denies any recent falls.  Working with [facility] PT/OT/SLP. She is concerned that she may have injured her wrist when she fell prior to coming to ED, reports increased pain after working with OT. Currently using a wrist brace with some benefit.  Lives in own home, son currently living with her, does need assistance for ADLs and IADLs. Prior to stroke was completely independent as well as driving. She has not yet returned back to driving. Denies new stroke/TIA symptoms.  Compliant on aspirin and atorvastatin, denies side effects.  Blood pressure today 138/85.  Routinely monitors at home with home health therapy  and usually overall stable. Glucose levels monitored at home which has been stable. Loop recorder has not shown atrial fibrillation thus far.  She has since been seen by PCP and endocrinology. Repeat lab work on 2/8 showed LDL 78 and A1c 7.5.  No further concerns at this  time.   Stroke admission 08/01/2021 [name] is a 77 y.o. female with history of diabetes, hypertension, hyperlipidemia, obesity, and sleep apnea who presented to [facility] ED on 08/01/2021 after being found down by her son. Her last know well was approximately 07/29/2021. She may have been suffering with some upper respiratory symptoms, took some cough medicine and has not been like herself since Tuesday (1/10).  She also had not taken her diabetes medications.  She was found to be mildly hyperglycemic with blood sugars in the upper 200s.  CTH showed infarct versus mass in right MCA territory.  She was transferred to [facility] ED for further evaluation.  MRI showed extensive restricted diffusion throughout much of the right MCA territory with associated changes of hemorrhagic transformation and localized cerebral edema and mass effect. Per Dr. Pearlean Brownie, felt embolic pattern secondary to unclear source although concerning for occult A fib. 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  CTA head/neck showed right M2 thrombus.  EF 60 to 65%.  LDL 93.  A1c 7.4.  Recommended placement of ILR prior to discharge to evaluate for A-fib.  On aspirin PTA, held during admission but restarted at discharge.  Initiated amlodipine and lisinopril for BP management.  Switched home dose simvastatin to atorvastatin 20 mg daily.  Resumed home diabetic medication regimen.  Other stroke risk factors include advanced age, obesity family history of stroke and OSA on CPAP.  Per therapy recommendations, discharged to CIR on 1/18 for ongoing therapy needs.     PERTINENT IMAGING  Per hospitalization 08/01/2021 Code Stroke- infarct versus mass in the right MCA territory. Repeat Head CT- Unchanged right MCA territory infarct with petechial hemorrhage. CTA head & neck- R M2 thrombus MRI- extensive restricted diffusion throughout much of the right MCA territory with associated  changes of hemorrhagic transformation and localized cerebral edema and mass-effect.. 2D Echo ejection fraction 60 to 65%. LDL 93 HgbA1c 7.4    ROS:   14 system review of systems performed and negative with exception of those listed in HPI  PMH:  Past Medical History:  Diagnosis Date   Abnormal levels of other serum enzymes 07/17/2015   Arthritis    Constipation 07/11/2015   COVID-19 03/06/2021   Diabetes mellitus without complication (HCC)    Elevated liver enzymes    Fatty liver    Generalized abdominal pain 07/11/2015   Hyperlipidemia    Hypertension    Lifelong obesity    Macular degeneration    Morbid obesity due to excess calories (HCC) 07/11/2015   Other fatigue 07/11/2015   Sleep apnea    Spinal headache     PSH:  Past Surgical History:  Procedure Laterality Date   ABDOMINAL HYSTERECTOMY     APPENDECTOMY     CATARACT EXTRACTION     CERVICAL LAMINECTOMY  07/21/1985   CESAREAN SECTION     COLONOSCOPY  07/21/2012   diverticulosis, [facility] Dr. [name]    COLONOSCOPY WITH PROPOFOL N/A 02/04/2019   Procedure: COLONOSCOPY WITH PROPOFOL;  Surgeon: [name], MD;  Location: [facility] ENDOSCOPY;  Service: Endoscopy;  Laterality: N/A;   COLONOSCOPY WITH PROPOFOL N/A 03/18/2021   Procedure: COLONOSCOPY  WITH PROPOFOL;  Surgeon: [name], MD;  Location: [facility] ENDOSCOPY;  Service: Endoscopy;  Laterality: N/A;  DM   DIAGNOSTIC LAPAROSCOPY     ESOPHAGOGASTRODUODENOSCOPY (EGD) WITH PROPOFOL N/A 02/04/2019   Procedure: ESOPHAGOGASTRODUODENOSCOPY (EGD) WITH PROPOFOL;  Surgeon: [name], MD;  Location: [facility] ENDOSCOPY;  Service: Endoscopy;  Laterality: N/A;   ESOPHAGOGASTRODUODENOSCOPY (EGD) WITH PROPOFOL N/A 03/18/2021   Procedure: ESOPHAGOGASTRODUODENOSCOPY (EGD) WITH PROPOFOL;  Surgeon: [name], MD;  Location: [facility] ENDOSCOPY;  Service: Endoscopy;  Laterality: N/A;   LOOP RECORDER INSERTION N/A 08/05/2021   Procedure: LOOP RECORDER INSERTION;   Surgeon: [name], MD;  Location: [facility] INVASIVE CV LAB;  Service: Cardiovascular;  Laterality: N/A;   SPINE SURGERY     TUBAL LIGATION      Social History:  Social History   Socioeconomic History   Marital status: Widowed    Spouse name: Not on file   Number of children: Not on file   Years of education: Not on file   Highest education level: Bachelor's degree (e.g., BA, AB, BS)  Occupational History   Not on file  Tobacco Use   Smoking status: Never   Smokeless tobacco: Never   Tobacco comments:    Never smoke 08/08/22  Vaping Use   Vaping Use: Never used  Substance and Sexual Activity   Alcohol use: Not Currently    Comment: none last 24 months   Drug use: No   Sexual activity: Not on file  Other Topics Concern   Not on file  Social History Narrative   Not on file   Social Determinants of Health   Financial Resource Strain: Low Risk  (12/10/2017)   Overall Financial Resource Strain (CARDIA)    Difficulty of Paying Living Expenses: Not hard at all  Food Insecurity: No Food Insecurity (12/10/2017)   Hunger Vital Sign    Worried About Running Out of Food in the Last Year: Never true    Ran Out of Food in the Last Year: Never true  Transportation Needs: No Transportation Needs (12/10/2017)   PRAPARE - Administrator, Civil Service (Medical): No    Lack of Transportation (Non-Medical): No  Physical Activity: Inactive (12/10/2017)   Exercise Vital Sign    Days of Exercise per Week: 0 days    Minutes of Exercise per Session: 0 min  Stress: No Stress Concern Present (12/10/2017)   [employer] of Occupational Health - Occupational Stress Questionnaire    Feeling of Stress : Not at all  Social Connections: Socially Integrated (12/10/2017)   Social Connection and Isolation Panel [NHANES]    Frequency of Communication with Friends and Family: More than three times a week    Frequency of Social Gatherings with Friends and Family: More than three times a  week    Attends Religious Services: More than 4 times per year    Active Member of [employer] or Organizations: Yes    Attends Engineer, structural: More than 4 times per year    Marital Status: Married  Catering manager Violence: Not At Risk (12/10/2017)   Humiliation, Afraid, Rape, and Kick questionnaire    Fear of Current or Ex-Partner: No    Emotionally Abused: No    Physically Abused: No    Sexually Abused: No    Family History:  Family History  Problem Relation Age of Onset   Cancer Mother        breast   Heart disease Mother  Stroke Mother    Diabetes Mother    Colon cancer Mother    Cancer Father        leukemia   Stroke Father    Leukemia Father    Ovarian cancer Sister    Diabetes Brother    Cancer Brother    Stroke Maternal Grandfather    Dementia Paternal Grandmother    COPD Neg Hx    Hypertension Neg Hx     Medications:   Current Outpatient Medications on File Prior to Visit  Medication Sig Dispense Refill   amLODipine (NORVASC) 2.5 MG tablet Take 1 tablet (2.5 mg total) by mouth daily. 30 tablet 0   apixaban (ELIQUIS) 5 MG TABS tablet Take 1 tablet (5 mg total) by mouth 2 (two) times daily. 60 tablet 3   celecoxib (CELEBREX) 200 MG capsule Take 200 mg by mouth 2 (two) times daily.     dapagliflozin propanediol (FARXIGA) 10 MG TABS tablet Take 10 mg by mouth daily. 90 tablet 1   gabapentin (NEURONTIN) 100 MG capsule Take 100 mg by mouth at bedtime. 2 tab     glipiZIDE (GLUCOTROL XL) 5 MG 24 hr tablet Take 1 tablet (5 mg total) by mouth 2 (two) times daily with a meal. 60 tablet 0   magnesium oxide (MAG-OX) 400 MG tablet Take 1 tablet (400 mg total) by mouth at bedtime. 30 tablet 0   metFORMIN (GLUCOPHAGE) 500 MG tablet Take 2 tablets (1,000 mg total) by mouth 2 (two) times daily. 120 tablet 0   Multiple Vitamins-Minerals (PRESERVISION AREDS 2) CAPS Take 1 capsule by mouth 2 (two) times daily.     pantoprazole (PROTONIX) 40 MG tablet Take 1 tablet (40  mg total) by mouth daily. 30 tablet 0   simvastatin (ZOCOR) 40 MG tablet Take 40 mg by mouth daily.     Vitamin D, Ergocalciferol, (DRISDOL) 1.25 MG (50000 UNIT) CAPS capsule Take 1 capsule (50,000 Units total) by mouth every 7 (seven) days. 5 capsule 0   acetaminophen (TYLENOL) 325 MG tablet Take 2 tablets (650 mg total) by mouth every 4 (four) hours as needed for mild pain (or temp > 37.5 C (99.5 F)).     No current facility-administered medications on file prior to visit.    Allergies:   Allergies  Allergen Reactions   Codeine Other (See Comments)    Makes her very loopy and groggy   Neosporin [Neomycin-Bacitracin Zn-Polymyx] Hives   Ozempic [Semaglutide] Hives   Polymyxin B Other (See Comments)    Eyes redness   Sulfa Antibiotics Itching      OBJECTIVE:  Physical Exam  Vitals:   08/25/22 1054  BP: 139/73  Pulse: 74  Weight: 191 lb 14.4 oz (87 kg)  Height: 5\' 2"  (1.575 m)    Body mass index is 35.1 kg/m. No results found.   General: well developed, well nourished, pleasant elderly Caucasian female, seated, in no evident distress Head: head normocephalic and atraumatic.   Neck: supple with no carotid or supraclavicular bruits Cardiovascular: regular rate and rhythm, no murmurs Musculoskeletal: Decreased left shoulder ROM Skin:  no rash/petichiae Vascular:  Normal pulses all extremities   Neurologic Exam Mental Status: Awake and fully alert. mild dysarthria.  No evidence of aphasia. Oriented to place and time. Recent memory mildly impaired and remote memory intact. Attention span, concentration and fund of knowledge appropriate during visit. Mood and affect appropriate.     08/25/2022   11:44 AM  MMSE - Mini Mental State Exam  Orientation to time 5  Orientation to Place 5  Registration 3  Attention/ Calculation 5  Recall 2  Language- name 2 objects 2  Language- repeat 1  Language- follow 3 step command 3  Language- read & follow direction 1  Write a  sentence 1  Copy design 1  Total score 29   Cranial Nerves: Pupils equal, briskly reactive to light. Extraocular movements full without nystagmus. Visual fields full to confrontation. Hearing intact. Facial sensation intact.  Left nasolabial fold flattening.  Tongue, palate moves normally and symmetrically.  Motor: Normal bulk and tone. Normal strength in all tested extremity muscles except slight LUE pronator drift, decreased left hand grip strength and decreased hand dexterity  Sensory.: intact to touch , pinprick , position and vibratory sensation.  Coordination: Rapid alternating movements normal in all extremities except left hand. Finger-to-nose performed accurately RUE and heel-to-shin performed accurately bilaterally. Gait and Station: Arises from chair with mild difficulty. Stance is normal.  Wide-based gait with slightly decreased step height bilaterally and unsteadiness without use of assistive device.  Tandem walk and heel toe not attempted.  Reflexes: 1+ and symmetric. Toes downgoing.          ASSESSMENT: Chriss Redel is a 77 y.o. 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 Vascular risk factors include new dx of A fib, HTN, HLD, DM, advanced age, obesity and OSA. Concerned over worsening short term memory over the past month.      PLAN:  Short term memory decline: Suspect multifactorial in setting of sedentary lifestyle without routine memory exercises or physical activity, lack of CPAP use and hx of stroke. Neuro exam intact and stable compared to prior exam.  MMSE today 29/30 Recommend starting Namenda at recommended titration  Discussed importance of routine memory exercises and physical activity. Advised to reach out to DME company to discuss CPAP concerns  R MCA stroke with HT :  Residual deficit:  LUE weakness, gait impairment, mild dysarthria and mild short-term memory loss.  Continue working with PT/OT Loop recorder showed A fib 06/2022 Continue Eliquis 5mg  BID and atorvastatin 20 mg daily for secondary stroke prevention.   Continue routine f/u with cardiology for  A fib and eliqis management  Discussed secondary stroke prevention measures and importance of close PCP follow up for aggressive stroke risk factor management including BP goal<130/90, HLD with LDL goal<70 and DM with A1c.<7.  Stroke labs 08/2021: A1c 7.5, calc LDL 78 I have gone over the pathophysiology of stroke, warning signs and symptoms, risk factors and their management in some detail with instructions to go to the closest emergency room for symptoms of concern.    Follow up in 6 months or call earlier if needed   CC:  PCP: Maryland Pink, MD    I spent 38 minutes of face-to-face and non-face-to-face time with patient and son.  This included previsit chart review, lab review, study review,  electronic health record documentation, patient and son education and discussion regarding above diagnoses and treatment plan and answered all the questions to patient and son's satisfaction   Frann Rider, [facility]  [facility] [address] [place], [place] [address]  Phone [phone] Fax [phone] Note: This document was prepared with digital dictation and possible smart phrase technology. Any transcriptional errors that result from this process are unintentional.

## 2022-08-21 NOTE — Therapy (Signed)
OUTPATIENT OCCUPATIONAL THERAPY TREATMENT NOTE   Patient Name: Alyssa Mayo MRN: [id number] DOB:09/21/1945, 77 y.o., female Today's Date: 03/14/2022  PCP: Alyssa Mina, MD REFERRING PROVIDER: [name], NP  OT End of Session - 08/21/22 2325     Visit Number 36    Number of Visits 48    Date for OT Re-Evaluation 10/23/22    Authorization Type Progress reporting period starting 06/19/2022    OT Start Time 1353    OT Stop Time 1430    OT Time Calculation (min) 37 min    Activity Tolerance Patient tolerated treatment well    Behavior During Therapy [facility] for tasks assessed/performed                 Past Medical History:  Diagnosis Date   Abnormal levels of other serum enzymes 07/17/2015   Arthritis    Constipation 07/11/2015   COVID-19 03/06/2021   Diabetes mellitus without complication (HCC)    Elevated liver enzymes    Fatty liver    Generalized abdominal pain 07/11/2015   Hyperlipidemia    Hypertension    Lifelong obesity    Macular degeneration    Morbid obesity due to excess calories (HCC) 07/11/2015   Other fatigue 07/11/2015   Sleep apnea    Spinal headache    Past Surgical History:  Procedure Laterality Date   ABDOMINAL HYSTERECTOMY     APPENDECTOMY     CATARACT EXTRACTION     CERVICAL LAMINECTOMY  07/21/1985   CESAREAN SECTION     COLONOSCOPY  07/21/2012   diverticulosis, [facility] Dr. Ricki Mayo    COLONOSCOPY WITH PROPOFOL N/A 02/04/2019   Procedure: COLONOSCOPY WITH PROPOFOL;  Surgeon: Alyssa Deem, MD;  Location: [facility] ENDOSCOPY;  Service: Endoscopy;  Laterality: N/A;   COLONOSCOPY WITH PROPOFOL N/A 03/18/2021   Procedure: COLONOSCOPY WITH PROPOFOL;  Surgeon: Alyssa Bill, MD;  Location: [facility] ENDOSCOPY;  Service: Endoscopy;  Laterality: N/A;  DM   DIAGNOSTIC LAPAROSCOPY     ESOPHAGOGASTRODUODENOSCOPY (EGD) WITH PROPOFOL N/A 02/04/2019   Procedure: ESOPHAGOGASTRODUODENOSCOPY (EGD) WITH PROPOFOL;  Surgeon: Alyssa Deem,  MD;  Location: [facility] ENDOSCOPY;  Service: Endoscopy;  Laterality: N/A;   ESOPHAGOGASTRODUODENOSCOPY (EGD) WITH PROPOFOL N/A 03/18/2021   Procedure: ESOPHAGOGASTRODUODENOSCOPY (EGD) WITH PROPOFOL;  Surgeon: Alyssa Bill, MD;  Location: [facility] ENDOSCOPY;  Service: Endoscopy;  Laterality: N/A;   LOOP RECORDER INSERTION N/A 08/05/2021   Procedure: LOOP RECORDER INSERTION;  Surgeon: Alyssa Maw, MD;  Location: [facility] INVASIVE CV LAB;  Service: Cardiovascular;  Laterality: N/A;   SPINE SURGERY     TUBAL LIGATION     Patient Active Problem List   Diagnosis Date Noted   Paroxysmal atrial fibrillation (HCC) 08/08/2022   Hypercoagulable state due to paroxysmal atrial fibrillation (HCC) 08/08/2022   Cerebral edema (HCC) 08/07/2021   OSA (obstructive sleep apnea) 08/07/2021   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   CVA (cerebral vascular accident) (HCC) 08/01/2021   GERD (gastroesophageal reflux disease) 07/20/2019   Advanced care planning/counseling discussion 12/17/2016   Skin lesions, generalized 11/13/2016   Chronic right hip pain 06/17/2016   Vitamin D deficiency 09/26/2015   Diastasis recti 08/08/2015   Elevated serum GGT level 07/24/2015   Essential hypertension 07/17/2015   Fatty liver 07/17/2015   Elevated alkaline phosphatase level 07/17/2015   Elevated serum glutamic pyruvic transaminase (SGPT) level 07/17/2015   Abnormal finding on EKG 07/11/2015   Morbid obesity (HCC) 07/11/2015   Colon, diverticulosis  07/11/2015   Abdominal wall hernia 07/11/2015   DM (diabetes mellitus), type 2 with neurological complications ([place])    Hyperlipidemia     ONSET DATE: 08/01/2021  REFERRING DIAG: CVA  THERAPY DIAG:  Muscle weakness (generalized)  Rationale for Evaluation and Treatment Rehabilitation  SUBJECTIVE:   SUBJECTIVE STATEMENT:    Pt. reports having had blood drawn today, however reports she had them take it in the right arm instead of the left.   Pt  accompanied by: self  PERTINENT HISTORY:  Pt. is a 77 y.o. female who presents with a diagnosis of RMCA CVA infarction with hemorrhagic transformation. Pt. attended inpatient rehab from 08/07/2021-08/22/2021. Pt. received Home health therapy services this past spring.  Pt. has recently had an assessment through driver rehabilitation services at [facility] with recommendations for referrals for  outpatient OT/PT, and ST services. Pt. PMHx includes: HTN, Hyperlipidemia, Macular degeneration, DM, and Obesity.  PRECAUTIONS: None  WEIGHT BEARING RESTRICTIONS No  PAIN:  Are you having pain? No   FALLS: Has patient fallen in last 6 months? Yes.  LIVING ENVIRONMENT: Lives with: lives with their family  Son  Alyssa Mayo Lives in: House/apartment Main living are on one floor Stairs:  2 steps to enter Has following equipment at home: Single point cane, Wheelchair (manual), shower chair, Grab bars, bed rail, and rubber mat.  PLOF: Independent  PATIENT GOALS: To be able to Drive, and cook again  OBJECTIVE:   HAND DOMINANCE: Right   ADLs: Overall ADLs:  Transfers/ambulation related to ADLs: Independent Eating: Independent Grooming: Pt. Fatigues with sustained BUE's in elevation for haircare. Especially with blow drying hair. UB Dressing: Independent pullover shirt, independent now with fastening a bra LB Dressing: Independent donning pants socks, and slide on shoes Toileting: Independent Bathing: Independent Tub Shower transfers: Walk-in shower Independent now   IADLs: Shopping: Needs to be accompanied to the grocery store. Light housekeeping: Does own laundry, laundry,  Meal Prep:  Difficulty with cooking Community mobility: Relies on son, and friend Medication management: Son sets up Building surveyor management: Manages monthly bills independently. Handwriting: No change from baseline  MOBILITY STATUS: Hx of falls out of bed   ACTIVITY TOLERANCE: Activity tolerance:  10-20 min. Before rest  break  FUNCTIONAL OUTCOME MEASURES: FOTO: 57  TR score: 62  UPPER EXTREMITY ROM     Active ROM Right eval Left eval Left 05/06/2022 Left 05/27/2022 Left 06/19/2022  Left 07/31/2022  Shoulder flexion WFL 96(110) 107(110) 109(120) 112(126) 578(469)  Shoulder abduction WFL 82(105) 86(105) 92(115) 94(115) 96(116)  Shoulder adduction        Shoulder extension        Shoulder internal rotation        Shoulder external rotation        Elbow flexion [facility] WFL  Elbow extension [facility] WFL  Wrist flexion WFL 44(60) 68(68) WFL WFL WFL  Wrist extension WFL 32(60) 66(66) WFL WFL WFL  Wrist ulnar deviation        Wrist radial deviation        Wrist pronation        Wrist supination        (Blank rows = not tested)   UPPER EXTREMITY MMT:     MMT Right eval Left eval Left 05/06/2022 Left 05/27/2022 Left 06/19/2022 Left 07/31/2022  Shoulder flexion 4+/5 3-/5 3+/5 3+/5 3+/5 3+/5  Shoulder abduction 4+/5 3-/5 3-/5 3+/5 3+/5 3+/5  Shoulder adduction  Shoulder extension        Shoulder internal rotation        Shoulder external rotation        Middle trapezius        Lower trapezius        Elbow flexion 4+/5 4-/5 4+/5 5/5 5/5 5/5  Elbow extension 4+/5 4-/5 4+/5 5/5 5/5 5/5  Wrist flexion 4+/5 3-/5 4/5 4+/5 4+/5 4+/5  Wrist extension 4+/5 3/5 4/5 4+/5 4+/5 4+/5  Wrist ulnar deviation        Wrist radial deviation        Wrist pronation        Wrist supination        (Blank rows = not tested)  HAND FUNCTION: Grip strength: Right: 30 lbs; Left: 13 lbs, Lateral pinch: Right: 15 lbs, Left: 8 lbs, and 3 point pinch: Right: 13 lbs, Left: 7 lbs  05/06/2022 Grip strength: Right: 30 lbs; Left: 14 lbs, Lateral pinch: Right: 15 lbs, Left: 10 lbs, and 3 point pinch: Right: 13 lbs, Left: 7 lbs  05/27/2022:  Grip strength: Right: 30 lbs; Left: 17 lbs, Lateral pinch: Right: 15 lbs, Left: 11 lbs, and 3 point pinch: Right: 13 lbs, Left: 7  lbs  06/19/2022:  Grip strength: Right: 30 lbs; Left: 18 lbs, Lateral pinch: Right: 15 lbs, Left:  10 lbs, and 3 point pinch: Right: 13 lbs, Left: 8 lbs  07/31/2022:   Grip strength: Right: 30 lbs; Left: 13 lbs, Lateral pinch: Right: 15 lbs, Left:  10 lbs, and 3 point pinch: Right: 13 lbs, Left: 10 lbs   COORDINATION:  9 Hole Peg test: Right: 26  sec; Left: 1 min. & 9 sec  05/06/2022 9 Hole Peg test: Right: 26  sec; Left: 1 min.  05/27/2022  9 Hole Peg test: Right: 26  sec; Left: 1 min. 4 sec.   06/19/2022  9 Hole Peg test: Right: 26  sec; Left: 1 min.   07/31/2022  9 Hole Peg test: Right: 26  sec; Left: 59 sec.    SENSATION: Light touch: WFL Proprioception: WFL  COGNITION: Overall cognitive status: Within functional limits for tasks assessed  VISION: Subjective report: Glasses. Just received a new prescription to increase bifocal strength Baseline vision: Macular Degeneration Visual history: macular degeneration  VISION ASSESSMENT: To be further assessed in functional context  Left sided Awareness    PERCEPTION: Limited left sided awareness   TODAY'S TREATMENT:   TODAY'S TREATMENT:    Self-care:  Pt. worked on [employer] copying a list of simulated money dollar amounts from paper into the computer.  Emphasis was placed on using her left hand during the task using the square number pad on the computer to simulate a calculator. Pt. Was able to enter the amounts with 50% accuracy. Pt. Required cues to identify mistypes. One dollar amount was omitted from the list. Pt. Requires verbal cues to make corrections.   PATIENT EDUCATION: Education details:  Simulated  Person educated: Patient Education method: verbal cues Education comprehension: verbalized understanding and pt does not plan to cook unless son is present.   HOME EXERCISE PROGRAM:  Continue with an ongoing assessment of HEP needs    GOALS: Goals reviewed with patient? Yes   SHORT TERM  GOALS: Target date:  04/15/2022      Pt. Will improve FOTO score by 2 points to reflect improved pt. perceived functional performance Baseline: FOTO: 57, TR score: 62 10th visit: FOTO: 65, 20th visit: FOTO 62, TR  score: 62, 30th visit: FOTO score: 60 Goal status: Achieved  LONG TERM GOALS: Target date: 10/23/2022    Pt. Will improve left UE strength by 2 mm grades to assist with repositioning her grandson on her lap. Baseline: Eval: left shoulder flexion: 3-/5, abduction 3-/5,  elbow flex/ext. 4-/5, wrist extension 3/5, wrist flexion 3-/5  10th visit: left shoulder flexion: 3+/5, abduction 3-+5,  elbow flex/ext. 4+/5, wrist extension 4/5, wrist flexion 4/5 05/27/2022: left shoulder flexion: 3+/5, abduction 3+/5,  elbow flex/ext. 5/5, wrist extension 4+/5, wrist flexion 4+/5  20th visit: left shoulder flexion: 3+/5, abduction 3+/5,  elbow flex/ext. 5/5, wrist extension 4+/5, wrist flexion 4+/5. Pt. can now hold her grandson grandson on her lap, and reposition him. Pt. Is unable to hold him in standing. 30th visit: left shoulder flexion: 3+/5, abduction 3+/5,  elbow flex/ext. 5/5, wrist extension 4+/5, wrist flexion 4+/5 Goal status: Ongoing  2.  Pt. will improve left grip strength by 5# to be able to independently hold and use a blow dryer, and brush for hair care. Baseline: Eval: Left grip strength 13#. Pt. has difficulty performing hair care, holding and using a brush, and blow dryer. 10th visit: Left grip 14#. Pt. Is now able to hold th e blow dryer for the sides, and top of head, not the back. 11/07: Left 17# Pt. Has difficulty blow drying then back of her head.20th visit: Left grip 18#. Pt. Is able to independently hold a brush, and blow dry her hair.  Goal status: Achieved  3.  Pt. Will improve left lateral pinch strength by 3# to be able to independently cut meat. Baseline: Eval: Left 8, Pt. Has difficulty cutting meat. 10th: Left: 10#.  Pt. is able to cut tender meat, however is not as  efficient 11/07: Left 11#. Pt. Is independent cutting meat with increased time. 20th visit: Left: 10#. Pt. has difficulty positioning, and stabilizing the utensils while cutting meat. Goal status:  Achieved  4.  Pt. Will improve Left hand [facility] skills to be able to be able to independently, and efficiently manipulate small objects during ADL tasks. Baseline: Eval: left: 1 min. 9 sec. 10th visit: Left: 1 min. Pt.is improving with manipulating small objects. 11/07: Left: 1 min. & 4 sec. Pt. has difficulty manipulating small objects. 20th visit: Left:  1 min. Pt. is improving with grasping pills, however continues to have difficulty. 30th visit: Left:  1 min. Pt. Is improving with grasping pills, however continues to have difficulty. Goal status: Ongoing   5.  Pt. Will perform light meal preparation with modified independence Baseline: Eval: Pt. reports having difficulty performing light  meal preparation, and cooking tasks. 10th visit: Pt. is able to make toast in the morning, a sandwich,  use the microwave, make coffee, and pour herself a drink. 11/07: Pt. has difficulty holding a coffee pot when filling the reservoir 20th visit: Pt. Is making waffles in the toaster, heats items in the microwave. Pt. uses the oven when supervised. 30th visit: Pt. continues to perform light meal prep, hand heats in the microwave. Goal status: Ongoing  6.: Pt. will improve with left grip strength to be able to securely hold a drink.   Baseline: 30th visit: Left grip strength 13#. Pt. has difficulty holding a full drink  securely with the left hand.  Goal status: New  7. Pt. Will improve typing speed, and accuracy in preparation for efficiently typing simple email message.  Baseline: 30th visit: Typing net speed: 3 wpm, accuracy 48%  for a 1 min. Typing test.  Goal status: New        CLINICAL IMPRESSION:  Pt. Was late for the session, and reports her son had to leave to to pick up her grandson from school. Pt.  continues to require work on consistency, and accuracy with typing number/dollar amount lists. Pt. presents with difficulty isolating 4th, and 5th digit extension required to consistently lift the fingers off of the keys causing the letters to be repeated multiple times in the series. Pt. Continues to present with multiple errors, and requires cues to initiate corrections. Pt. Needs continued work on improving left hand function during typing.         PERFORMANCE DEFICITS in functional skills including ADLs, IADLs, coordination, dexterity, sensation, ROM, strength, FMC, decreased knowledge of use of DME, vision, and UE functional use, cognitive skills including attention, and psychosocial skills including coping strategies, environmental adaptation, habits, and routines and behaviors.   IMPAIRMENTS are limiting patient from ADLs, IADLs, and social participation.   COMORBIDITIES may have co-morbidities  that affects occupational performance. Patient will benefit from skilled OT to address above impairments and improve overall function.  MODIFICATION OR ASSISTANCE TO COMPLETE EVALUATION: Min-Moderate modification of tasks or assist with assess necessary to complete an evaluation.  OT OCCUPATIONAL PROFILE AND HISTORY: Detailed assessment: Review of records and additional review of physical, cognitive, psychosocial history related to current functional performance.  CLINICAL DECISION MAKING: Moderate - several treatment options, min-mod task modification necessary  REHAB POTENTIAL: Good  EVALUATION COMPLEXITY: Moderate    PLAN: OT FREQUENCY: 2x/week  OT DURATION: 12 weeks  PLANNED INTERVENTIONS: self care/ADL training, therapeutic exercise, therapeutic activity, neuromuscular re-education, manual therapy, passive range of motion, and paraffin  RECOMMENDED OTHER SERVICES: ST, and PT  CONSULTED AND AGREED WITH PLAN OF CARE: Patient  PLAN FOR NEXT SESSION:  L hand strengthening and  coordination exercises  Harrel Carina, MS, OTR/L

## 2022-08-25 ENCOUNTER — Ambulatory Visit: Payer: Medicare HMO | Admitting: Adult Health

## 2022-08-25 ENCOUNTER — Encounter: Payer: Self-pay | Admitting: Adult Health

## 2022-08-25 VITALS — BP 139/73 | HR 74 | Ht 62.0 in | Wt 191.9 lb

## 2022-08-25 DIAGNOSIS — I69398 Other sequelae of cerebral infarction: Secondary | ICD-10-CM

## 2022-08-25 DIAGNOSIS — I69319 Unspecified symptoms and signs involving cognitive functions following cerebral infarction: Secondary | ICD-10-CM

## 2022-08-25 DIAGNOSIS — I63511 Cerebral infarction due to unspecified occlusion or stenosis of right middle cerebral artery: Secondary | ICD-10-CM | POA: Diagnosis not present

## 2022-08-25 DIAGNOSIS — R269 Unspecified abnormalities of gait and mobility: Secondary | ICD-10-CM | POA: Diagnosis not present

## 2022-08-25 MED ORDER — MEMANTINE HCL 10 MG PO TABS: 10.0000 mg | ORAL_TABLET | Freq: Two times a day (BID) | ORAL | 5 refills | Status: DC

## 2022-08-25 MED ORDER — MEMANTINE HCL 28 X 5 MG & 21 X 10 MG PO TABS: ORAL_TABLET | ORAL | 0 refills | Status: DC

## 2022-08-25 NOTE — Patient Instructions (Signed)
Recommend starting Namenda with a gradual titration. Please call with any difficulty tolerating.   Please ensure you are routinely doing memory exercises at home as well as compensation strategies   Continue Eliquis (apixaban) daily  and atorvastatin  for secondary stroke prevention  Continue to follow up with PCP regarding cholesterol and blood pressure management  Maintain strict control of hypertension with blood pressure goal below 130/90 and cholesterol with LDL cholesterol (bad cholesterol) goal below 70 mg/dL.   Signs of a Stroke? Follow the BEFAST method:  Balance Watch for a sudden loss of balance, trouble with coordination or vertigo Eyes Is there a sudden loss of vision in one or both eyes? Or double vision?  Face: Ask the person to smile. Does one side of the face droop or is it numb?  Arms: Ask the person to raise both arms. Does one arm drift downward? Is there weakness or numbness of a leg? Speech: Ask the person to repeat a simple phrase. Does the speech sound slurred/strange? Is the person confused ? Time: If you observe any of these signs, call 911.    Followup in the future with me in 6 months or call earlier if needed       Thank you for coming to see [place] at [facility]. I hope we have been able to provide you high quality care today.  You may receive a patient satisfaction survey over the next few weeks. We would appreciate your feedback and comments so that we may continue to improve ourselves and the health of our patients.

## 2022-08-26 ENCOUNTER — Ambulatory Visit: Payer: Medicare HMO | Admitting: Occupational Therapy

## 2022-08-26 ENCOUNTER — Encounter: Payer: Medicare HMO | Admitting: Speech Pathology

## 2022-08-26 ENCOUNTER — Ambulatory Visit: Payer: Medicare HMO

## 2022-08-26 DIAGNOSIS — R262 Difficulty in walking, not elsewhere classified: Secondary | ICD-10-CM

## 2022-08-26 DIAGNOSIS — M6281 Muscle weakness (generalized): Secondary | ICD-10-CM

## 2022-08-26 DIAGNOSIS — R41841 Cognitive communication deficit: Secondary | ICD-10-CM

## 2022-08-26 DIAGNOSIS — R278 Other lack of coordination: Secondary | ICD-10-CM

## 2022-08-26 DIAGNOSIS — I63511 Cerebral infarction due to unspecified occlusion or stenosis of right middle cerebral artery: Secondary | ICD-10-CM

## 2022-08-26 DIAGNOSIS — R2681 Unsteadiness on feet: Secondary | ICD-10-CM

## 2022-08-26 NOTE — Therapy (Signed)
OUTPATIENT PHYSICAL THERAPY NEURO TREATMENT    Patient Name: Alyssa Mayo MRN: [id number] DOB:[birth year], 77 y.o., female Today's Date: 08/26/2022   PCP: [place] Pink, MD REFERRING PROVIDER: Frann Rider, NP   PT End of Session - 08/26/22 1019     Visit Number 29    Number of Visits 22    Date for PT Re-Evaluation 09/16/22    Authorization Type [employer]    Authorization Time Period 04/02/2022-06/25/2022; 06/24/2022-09/16/2022    PT Start Time 1019    PT Stop Time 1100    PT Time Calculation (min) 41 min    Equipment Utilized During Treatment Gait belt    Activity Tolerance Patient tolerated treatment well                   Past Medical History:  Diagnosis Date   Abnormal levels of other serum enzymes 07/17/2015   Arthritis    Constipation 07/11/2015   COVID-19 03/06/2021   Diabetes mellitus without complication ([place])    Elevated liver enzymes    Fatty liver    Generalized abdominal pain 07/11/2015   Hyperlipidemia    Hypertension    Lifelong obesity    Macular degeneration    Morbid obesity due to excess calories ([place]) 07/11/2015   Other fatigue 07/11/2015   Sleep apnea    Spinal headache    Past Surgical History:  Procedure Laterality Date   ABDOMINAL HYSTERECTOMY     APPENDECTOMY     CATARACT EXTRACTION     CERVICAL LAMINECTOMY  07/21/1985   CESAREAN SECTION     COLONOSCOPY  07/21/2012   diverticulosis, [facility] Dr. Donnella Sham    COLONOSCOPY WITH PROPOFOL N/A 02/04/2019   Procedure: COLONOSCOPY WITH PROPOFOL;  Surgeon: Lollie Sails, MD;  Location: [facility] ENDOSCOPY;  Service: Endoscopy;  Laterality: N/A;   COLONOSCOPY WITH PROPOFOL N/A 03/18/2021   Procedure: COLONOSCOPY WITH PROPOFOL;  Surgeon: Lesly Rubenstein, MD;  Location: [facility] ENDOSCOPY;  Service: Endoscopy;  Laterality: N/A;  DM   DIAGNOSTIC LAPAROSCOPY     ESOPHAGOGASTRODUODENOSCOPY (EGD) WITH PROPOFOL N/A 02/04/2019   Procedure: ESOPHAGOGASTRODUODENOSCOPY (EGD) WITH PROPOFOL;   Surgeon: Lollie Sails, MD;  Location: [facility] ENDOSCOPY;  Service: Endoscopy;  Laterality: N/A;   ESOPHAGOGASTRODUODENOSCOPY (EGD) WITH PROPOFOL N/A 03/18/2021   Procedure: ESOPHAGOGASTRODUODENOSCOPY (EGD) WITH PROPOFOL;  Surgeon: Lesly Rubenstein, MD;  Location: [facility] ENDOSCOPY;  Service: Endoscopy;  Laterality: N/A;   LOOP RECORDER INSERTION N/A 08/05/2021   Procedure: LOOP RECORDER INSERTION;  Surgeon: Evans Lance, MD;  Location: [place] CV LAB;  Service: Cardiovascular;  Laterality: N/A;   [place]     TUBAL LIGATION     Patient Active Problem List   Diagnosis Date Noted   Paroxysmal atrial fibrillation ([place]) 08/08/2022   Hypercoagulable state due to paroxysmal atrial fibrillation ([place]) 08/08/2022   Cerebral edema ([place]) 08/07/2021   OSA (obstructive sleep apnea) 08/07/2021   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   CVA (cerebral vascular accident) ([place]) 08/01/2021   GERD (gastroesophageal reflux disease) 07/20/2019   Advanced care planning/counseling discussion 12/17/2016   Skin lesions, generalized 11/13/2016   Chronic right hip pain 06/17/2016   Vitamin D deficiency 09/26/2015   Diastasis recti 08/08/2015   Elevated serum GGT level 07/24/2015   Essential hypertension 07/17/2015   Fatty liver 07/17/2015   Elevated alkaline phosphatase level 07/17/2015   Elevated serum glutamic pyruvic transaminase (SGPT) level 07/17/2015   Abnormal finding on EKG 07/11/2015   Morbid obesity ([place]) 07/11/2015  Colon, diverticulosis 07/11/2015   Abdominal wall hernia 07/11/2015   DM (diabetes mellitus), type 2 with neurological complications [facility])    Hyperlipidemia     ONSET DATE: January 11th 2023  REFERRING DIAG:  I69.398,R26.9 (ICD-10-CM) - Gait disturbance, post-stroke  I63.511 (ICD-10-CM) - Right middle cerebral artery stroke (HCC)    THERAPY DIAG:  Muscle weakness (generalized)  Other lack of coordination  Right middle cerebral artery stroke  (HCC)  Cognitive communication deficit  Difficulty in walking, not elsewhere classified  Unsteadiness on feet  Rationale for Evaluation and Treatment Rehabilitation  SUBJECTIVE:  Pt denies any falls since the last time she was here.  Pt states her L shoulder is still bothering her, but nothing worse.  Pt has been placed on Eliquis since last visit as well.  Pt also placed on Nomenda to assist with her memory.  Pt states that her son, [name], believes that her memory has been declining recently.  Pt accompanied by: self; [name] her son also accompanying pt.   PERTINENT HISTORY: Per chart and confirmed by pt:  Pt is a 77 yo female with RMCA CVA infarction with hemorrhagic transformation 08/01/2021, with inpatient rehab 08/07/2021-08/22/2021 followed by [facility] therapy until June. Pt now currently being seen for outpatient OT and ST services. Pt reports limitations in stamina persist. Other PMH is significant for HTN, HLD, macular degeneration, DM, obesity, sleep apnea, arthritis, chronic R hip pain, vitamin D deficiency, diastasis recti, abdominal wall hernia, hx of abdominal hysterectomy, appendectomy, spine surgery (years ago).   PAIN:  Are you having pain?  0/10 Pain Location:     TODAY'S TREATMENT:   TherEx:  Moist heat pack applied to the L shoulder due to pt experiencing pain in the L shoulder  Seated hip adduction into yellow physioball, 2x15, 3 sec holds Seated LAQ with hip adduction into yellow physioball, 2x15 Seated LAQ with 5# AW donned, 2x15 each LE Seated resisted marches with BTB resistance applied at distal thighs, 2x15 each Seated resisted marches with BTB resistance and 5# AW donned, 2x15  Ambulation to the cancer center and back (1000 ft) while wearing 5# AW on each LE, pt requiring frequent rest breaks in order to recover     PATIENT EDUCATION: Education details: exam findings, indications, plan, HEP Person educated: Patient Education method: Consulting civil engineer,  Demonstration, Verbal cues, and Handouts Education comprehension: verbalized understanding, returned demonstration, verbal cues required, and needs further education   HOME EXERCISE PROGRAM:  Access Code: [email or web address] URL: [email or web address] Date: 06/02/2022 Prepared by: [name]  Exercises - Standing Single Leg Stance with Counter Support  - 2 x daily - 7 x weekly - 1 sets - 10 reps - 10s  hold - Standing Marching  - 2 x daily - 7 x weekly - 2 sets - 20 reps - Side Stepping with Counter Support  - 2 x daily - 7 x weekly - 2 sets - 20 reps - Walking  - 2 x daily - 7 x weekly    GOALS:  Goals reviewed with patient? Yes  SHORT TERM GOALS: Target date: 10/07/2022  Patient will be independent in home exercise program to improve strength/mobility for better functional independence with ADLs. Baseline: initiated; Patient reports walking but not always independent with progressive HEP. Will keep goal active to anticipate more balance ex for HEP.  Goal status: PROGRESSING   LONG TERM GOALS: Target date: 11/18/2022  Patient will increase FOTO score to equal to or greater than 65  to demonstrate improvement in mobility  and quality of life.  Baseline: 57 06/24/2022= 61 Goal status: IN PROGRESS  2.  Patient (> 72 years old) will complete five times sit to stand test in < 15 seconds indicating an increased LE strength and improved balance. Baseline: 17 sec hands-free 05/21/2022 - 15.36 sec 06/24/2022 - 13.56 sec  Goal status: GOAL MET  3.  Patient will increase 10 meter walk test to >1.65m/s as to improve gait speed for better community ambulation and to reduce fall risk. Baseline: 0.66 m/s; 11/1:  0.85 m/s 06/24/2022= 0.97 m/s without an AD Goal status: IN PROGRESS  4.  Patient will increase Berg Balance score by > 3 points to demonstrate decreased fall risk during functional activities. Baseline: 50/56 06/24/2022= 52/56 Goal status: IN PROGRESS  5.  Patient will  increase six minute walk test distance to >1000 for progression to community ambulator and improve gait ability Baseline: 04/10/22: 625ft (pain onset at 5 minutes) 05/21/22: 1027 ft 06/23/2022= 940 feet - will keep goal active until patient able to consistently achieve > 1000 feet Goal status: IN PROGRESS    ASSESSMENT:  CLINICAL IMPRESSION:  Pt responded well to the exercises today and put forth great effort throughout the session.  Pt to continue with  exercises that challenge balance and strength in the LE's.  Pt and therapist discussed next visit being progress note and goal assessment.  Pt to be assessed at that point to see if the pt is still appropriate for therapy.  Pt agreed to current POC and reassessment of goals at next visit.   Pt will continue to benefit from skilled therapy to address remaining deficits in order to improve overall QoL and return to PLOF.       OBJECTIVE IMPAIRMENTS Abnormal gait, decreased activity tolerance, decreased balance, decreased coordination, decreased endurance, decreased mobility, difficulty walking, decreased strength, impaired sensation, impaired UE functional use, improper body mechanics, postural dysfunction, and pain.   ACTIVITY LIMITATIONS carrying, lifting, bending, standing, squatting, stairs, bed mobility, bathing, reach over head, hygiene/grooming, and locomotion level  PARTICIPATION LIMITATIONS: meal prep, cleaning, laundry, medication management, personal finances, driving, shopping, community activity, and yard work  PERSONAL FACTORS Age, Sex, Time since onset of injury/illness/exacerbation, and 3+ comorbidities: Other PMH is significant for HTN, HLD, macular degeneration, DM, obesity, sleep apnea, arthritis, chronic R hip pain, vitamin D deficiency, diastasis recti, abdominal wall hernia, hx of abdominal hysterectomy, appendectomy, spine surgery (years ago).   are also affecting patient's functional outcome.   REHAB POTENTIAL:  Good  CLINICAL DECISION MAKING: Evolving/moderate complexity  EVALUATION COMPLEXITY: Moderate  PLAN: PT FREQUENCY: 2x/week  PT DURATION: 12 weeks  PLANNED INTERVENTIONS: Therapeutic exercises, Therapeutic activity, Neuromuscular re-education, Balance training, Gait training, Patient/Family education, Self Care, Joint mobilization, Joint manipulation, Stair training, Vestibular training, Canalith repositioning, Orthotic/Fit training, DME instructions, Dry Needling, Electrical stimulation, Wheelchair mobility training, Spinal mobilization, Cryotherapy, Moist heat, Splintting, Taping, Traction, Ultrasound, Biofeedback, Manual therapy, and Re-evaluation  PLAN FOR NEXT SESSION:   LE strengthening: leg press/squats/etc. HEP compliance and introduce more cognitive related tasks.   Gwenlyn Saran, PT, DPT Physical Therapist- [facility]  08/26/22, 5:22 PM

## 2022-08-26 NOTE — Therapy (Signed)
OUTPATIENT OCCUPATIONAL THERAPY TREATMENT NOTE   Patient Name: Alyssa Mayo MRN: [id number] DOB:1946-04-28, 77 y.o., female Today's Date: 03/14/2022  PCP: [place] Pink, MD REFERRING PROVIDER: Frann Rider, NP  OT End of Session - 08/26/22 1329     Visit Number 37    Number of Visits 48    Date for OT Re-Evaluation 10/23/22    OT Start Time 1100    OT Stop Time 1145    OT Time Calculation (min) 45 min    Activity Tolerance Patient tolerated treatment well    Behavior During Therapy [facility] for tasks assessed/performed                 Past Medical History:  Diagnosis Date   Abnormal levels of other serum enzymes 07/17/2015   Arthritis    Constipation 07/11/2015   COVID-19 03/06/2021   Diabetes mellitus without complication ([place])    Elevated liver enzymes    Fatty liver    Generalized abdominal pain 07/11/2015   Hyperlipidemia    Hypertension    Lifelong obesity    Macular degeneration    Morbid obesity due to excess calories ([place]) 07/11/2015   Other fatigue 07/11/2015   Sleep apnea    Spinal headache    Past Surgical History:  Procedure Laterality Date   ABDOMINAL HYSTERECTOMY     APPENDECTOMY     CATARACT EXTRACTION     CERVICAL LAMINECTOMY  07/21/1985   CESAREAN SECTION     COLONOSCOPY  07/21/2012   diverticulosis, [facility] Dr. Donnella Sham    COLONOSCOPY WITH PROPOFOL N/A 02/04/2019   Procedure: COLONOSCOPY WITH PROPOFOL;  Surgeon: Lollie Sails, MD;  Location: [facility] ENDOSCOPY;  Service: Endoscopy;  Laterality: N/A;   COLONOSCOPY WITH PROPOFOL N/A 03/18/2021   Procedure: COLONOSCOPY WITH PROPOFOL;  Surgeon: Lesly Rubenstein, MD;  Location: [facility] ENDOSCOPY;  Service: Endoscopy;  Laterality: N/A;  DM   DIAGNOSTIC LAPAROSCOPY     ESOPHAGOGASTRODUODENOSCOPY (EGD) WITH PROPOFOL N/A 02/04/2019   Procedure: ESOPHAGOGASTRODUODENOSCOPY (EGD) WITH PROPOFOL;  Surgeon: Lollie Sails, MD;  Location: [facility] ENDOSCOPY;  Service: Endoscopy;  Laterality: N/A;    ESOPHAGOGASTRODUODENOSCOPY (EGD) WITH PROPOFOL N/A 03/18/2021   Procedure: ESOPHAGOGASTRODUODENOSCOPY (EGD) WITH PROPOFOL;  Surgeon: Lesly Rubenstein, MD;  Location: [facility] ENDOSCOPY;  Service: Endoscopy;  Laterality: N/A;   LOOP RECORDER INSERTION N/A 08/05/2021   Procedure: LOOP RECORDER INSERTION;  Surgeon: Evans Lance, MD;  Location: [place] CV LAB;  Service: Cardiovascular;  Laterality: N/A;   [place]     TUBAL LIGATION     Patient Active Problem List   Diagnosis Date Noted   Paroxysmal atrial fibrillation ([place]) 08/08/2022   Hypercoagulable state due to paroxysmal atrial fibrillation ([place]) 08/08/2022   Cerebral edema ([place]) 08/07/2021   OSA (obstructive sleep apnea) 08/07/2021   Right middle cerebral artery stroke ([place]) 08/07/2021   CVA (cerebral vascular accident) ([place]) 08/01/2021   GERD (gastroesophageal reflux disease) 07/20/2019   Advanced care planning/counseling discussion 12/17/2016   Skin lesions, generalized 11/13/2016   Chronic right hip pain 06/17/2016   Vitamin D deficiency 09/26/2015   Diastasis recti 08/08/2015   Elevated serum GGT level 07/24/2015   Essential hypertension 07/17/2015   Fatty liver 07/17/2015   Elevated alkaline phosphatase level 07/17/2015   Elevated serum glutamic pyruvic transaminase (SGPT) level 07/17/2015   Abnormal finding on EKG 07/11/2015   Morbid obesity ([place]) 07/11/2015   Colon, diverticulosis 07/11/2015   Abdominal wall hernia 07/11/2015   DM (  diabetes mellitus), type 2 with neurological complications ([place])    Hyperlipidemia     ONSET DATE: 08/01/2021  REFERRING DIAG: CVA  THERAPY DIAG:  Muscle weakness (generalized)  Rationale for Evaluation and Treatment Rehabilitation  SUBJECTIVE:   SUBJECTIVE STATEMENT:    Pt. reports  doing well tday   Pt accompanied by: self  PERTINENT HISTORY:  Pt. is a 77 y.o. female who presents with a diagnosis of RMCA CVA infarction with hemorrhagic transformation. Pt. attended  inpatient rehab from 08/07/2021-08/22/2021. Pt. received Home health therapy services this past spring.  Pt. has recently had an assessment through driver rehabilitation services at [facility] with recommendations for referrals for  outpatient OT/PT, and ST services. Pt. PMHx includes: HTN, Hyperlipidemia, Macular degeneration, DM, and Obesity.  PRECAUTIONS: None  WEIGHT BEARING RESTRICTIONS No  PAIN:  Are you having pain? No   FALLS: Has patient fallen in last 6 months? Yes.  LIVING ENVIRONMENT: Lives with: lives with their family  Son  Alyssa Mayo Lives in: House/apartment Main living are on one floor Stairs:  2 steps to enter Has following equipment at home: Single point cane, Wheelchair (manual), shower chair, Grab bars, bed rail, and rubber mat.  PLOF: Independent  PATIENT GOALS: To be able to Drive, and cook again  OBJECTIVE:   HAND DOMINANCE: Right   ADLs: Overall ADLs:  Transfers/ambulation related to ADLs: Independent Eating: Independent Grooming: Pt. Fatigues with sustained BUE's in elevation for haircare. Especially with blow drying hair. UB Dressing: Independent pullover shirt, independent now with fastening a bra LB Dressing: Independent donning pants socks, and slide on shoes Toileting: Independent Bathing: Independent Tub Shower transfers: Walk-in shower Independent now   IADLs: Shopping: Needs to be accompanied to the grocery store. Light housekeeping: Does own laundry, laundry,  Meal Prep:  Difficulty with cooking Community mobility: Relies on son, and friend Medication management: Son sets up Building surveyor management: Manages monthly bills independently. Handwriting: No change from baseline  MOBILITY STATUS: Hx of falls out of bed   ACTIVITY TOLERANCE: Activity tolerance:  10-20 min. Before rest break  FUNCTIONAL OUTCOME MEASURES: FOTO: 57  TR score: 62  UPPER EXTREMITY ROM     Active ROM Right eval Left eval Left 05/06/2022 Left 05/27/2022  Left 06/19/2022  Left 07/31/2022  Shoulder flexion WFL 96(110) 107(110) 109(120) 112(126) 782(956)  Shoulder abduction WFL 82(105) 86(105) 92(115) 94(115) 96(116)  Shoulder adduction        Shoulder extension        Shoulder internal rotation        Shoulder external rotation        Elbow flexion [facility] WFL  Elbow extension [facility] WFL  Wrist flexion WFL 44(60) 68(68) WFL WFL WFL  Wrist extension WFL 32(60) 66(66) WFL WFL WFL  Wrist ulnar deviation        Wrist radial deviation        Wrist pronation        Wrist supination        (Blank rows = not tested)   UPPER EXTREMITY MMT:     MMT Right eval Left eval Left 05/06/2022 Left 05/27/2022 Left 06/19/2022 Left 07/31/2022  Shoulder flexion 4+/5 3-/5 3+/5 3+/5 3+/5 3+/5  Shoulder abduction 4+/5 3-/5 3-/5 3+/5 3+/5 3+/5  Shoulder adduction        Shoulder extension        Shoulder internal rotation        Shoulder external rotation  Middle trapezius        Lower trapezius        Elbow flexion 4+/5 4-/5 4+/5 5/5 5/5 5/5  Elbow extension 4+/5 4-/5 4+/5 5/5 5/5 5/5  Wrist flexion 4+/5 3-/5 4/5 4+/5 4+/5 4+/5  Wrist extension 4+/5 3/5 4/5 4+/5 4+/5 4+/5  Wrist ulnar deviation        Wrist radial deviation        Wrist pronation        Wrist supination        (Blank rows = not tested)  HAND FUNCTION: Grip strength: Right: 30 lbs; Left: 13 lbs, Lateral pinch: Right: 15 lbs, Left: 8 lbs, and 3 point pinch: Right: 13 lbs, Left: 7 lbs  05/06/2022 Grip strength: Right: 30 lbs; Left: 14 lbs, Lateral pinch: Right: 15 lbs, Left: 10 lbs, and 3 point pinch: Right: 13 lbs, Left: 7 lbs  05/27/2022:  Grip strength: Right: 30 lbs; Left: 17 lbs, Lateral pinch: Right: 15 lbs, Left: 11 lbs, and 3 point pinch: Right: 13 lbs, Left: 7 lbs  06/19/2022:  Grip strength: Right: 30 lbs; Left: 18 lbs, Lateral pinch: Right: 15 lbs, Left:  10 lbs, and 3 point pinch: Right: 13 lbs, Left: 8 lbs  07/31/2022:   Grip  strength: Right: 30 lbs; Left: 13 lbs, Lateral pinch: Right: 15 lbs, Left:  10 lbs, and 3 point pinch: Right: 13 lbs, Left: 10 lbs   COORDINATION:  9 Hole Peg test: Right: 26  sec; Left: 1 min. & 9 sec  05/06/2022 9 Hole Peg test: Right: 26  sec; Left: 1 min.  05/27/2022  9 Hole Peg test: Right: 26  sec; Left: 1 min. 4 sec.   06/19/2022  9 Hole Peg test: Right: 26  sec; Left: 1 min.   07/31/2022  9 Hole Peg test: Right: 26  sec; Left: 59 sec.    SENSATION: Light touch: WFL Proprioception: WFL  COGNITION: Overall cognitive status: Within functional limits for tasks assessed  VISION: Subjective report: Glasses. Just received a new prescription to increase bifocal strength Baseline vision: Macular Degeneration Visual history: macular degeneration  VISION ASSESSMENT: To be further assessed in functional context  Left sided Awareness    PERCEPTION: Limited left sided awareness   TODAY'S TREATMENT:   TODAY'S TREATMENT:    Self-care:  Pt. worked on [employer] copying a list of  words using using mostly the left side of the hand, and keyboard. Pt. worked formulating rows of the words, followed by lists of the words using spaces and comas, and finally formulating sentences using then lists of of words.    PATIENT EDUCATION: Education details:  Simulated  Person educated: Patient Education method: verbal cues Education comprehension: verbalized understanding and pt does not plan to cook unless son is present.   HOME EXERCISE PROGRAM:  Continue with an ongoing assessment of HEP needs    GOALS: Goals reviewed with patient? Yes   SHORT TERM GOALS: Target date:  04/15/2022      Pt. Will improve FOTO score by 2 points to reflect improved pt. perceived functional performance Baseline: FOTO: 57, TR score: 62 10th visit: FOTO: 65, 20th visit: FOTO 62, TR score: 62, 30th visit: FOTO score: 60 Goal status: Achieved  LONG TERM GOALS: Target date: 10/23/2022     Pt. Will improve left UE strength by 2 mm grades to assist with repositioning her grandson on her lap. Baseline: Eval: left shoulder flexion: 3-/5, abduction 3-/5,  elbow flex/ext. 4-/5, wrist  extension 3/5, wrist flexion 3-/5  10th visit: left shoulder flexion: 3+/5, abduction 3-+5,  elbow flex/ext. 4+/5, wrist extension 4/5, wrist flexion 4/5 05/27/2022: left shoulder flexion: 3+/5, abduction 3+/5,  elbow flex/ext. 5/5, wrist extension 4+/5, wrist flexion 4+/5  20th visit: left shoulder flexion: 3+/5, abduction 3+/5,  elbow flex/ext. 5/5, wrist extension 4+/5, wrist flexion 4+/5. Pt. can now hold her grandson grandson on her lap, and reposition him. Pt. Is unable to hold him in standing. 30th visit: left shoulder flexion: 3+/5, abduction 3+/5,  elbow flex/ext. 5/5, wrist extension 4+/5, wrist flexion 4+/5 Goal status: Ongoing  2.  Pt. will improve left grip strength by 5# to be able to independently hold and use a blow dryer, and brush for hair care. Baseline: Eval: Left grip strength 13#. Pt. has difficulty performing hair care, holding and using a brush, and blow dryer. 10th visit: Left grip 14#. Pt. Is now able to hold th e blow dryer for the sides, and top of head, not the back. 11/07: Left 17# Pt. Has difficulty blow drying then back of her head.20th visit: Left grip 18#. Pt. Is able to independently hold a brush, and blow dry her hair.  Goal status: Achieved  3.  Pt. Will improve left lateral pinch strength by 3# to be able to independently cut meat. Baseline: Eval: Left 8, Pt. Has difficulty cutting meat. 10th: Left: 10#.  Pt. is able to cut tender meat, however is not as efficient 11/07: Left 11#. Pt. Is independent cutting meat with increased time. 20th visit: Left: 10#. Pt. has difficulty positioning, and stabilizing the utensils while cutting meat. Goal status:  Achieved  4.  Pt. Will improve Left hand [facility] skills to be able to be able to independently, and efficiently manipulate small  objects during ADL tasks. Baseline: Eval: left: 1 min. 9 sec. 10th visit: Left: 1 min. Pt.is improving with manipulating small objects. 11/07: Left: 1 min. & 4 sec. Pt. has difficulty manipulating small objects. 20th visit: Left:  1 min. Pt. is improving with grasping pills, however continues to have difficulty. 30th visit: Left:  1 min. Pt. Is improving with grasping pills, however continues to have difficulty. Goal status: Ongoing   5.  Pt. Will perform light meal preparation with modified independence Baseline: Eval: Pt. reports having difficulty performing light  meal preparation, and cooking tasks. 10th visit: Pt. is able to make toast in the morning, a sandwich,  use the microwave, make coffee, and pour herself a drink. 11/07: Pt. has difficulty holding a coffee pot when filling the reservoir 20th visit: Pt. Is making waffles in the toaster, heats items in the microwave. Pt. uses the oven when supervised. 30th visit: Pt. continues to perform light meal prep, hand heats in the microwave. Goal status: Ongoing  6.: Pt. will improve with left grip strength to be able to securely hold a drink.   Baseline: 30th visit: Left grip strength 13#. Pt. has difficulty holding a full drink  securely with the left hand.  Goal status: New  7. Pt. Will improve typing speed, and accuracy in preparation for efficiently typing simple email message.  Baseline: 30th visit: Typing net speed: 3 wpm, accuracy 48% for a 1 min. Typing test.  Goal status: New        CLINICAL IMPRESSION:  Pt. Reports seeing her neurologist, and was placed on a medication to help her memory. Pt. Continues to present with difficulty isolating 4th, and 5th digit extension required to consistently lift the  fingers off of the keys causing the letters to be repeated multiple times in the series. Pt. Continues to present with multiple errors, and requires cues to initiate corrections. Pt. Needs continued work on improving left hand function  during typing.         PERFORMANCE DEFICITS in functional skills including ADLs, IADLs, coordination, dexterity, sensation, ROM, strength, FMC, decreased knowledge of use of DME, vision, and UE functional use, cognitive skills including attention, and psychosocial skills including coping strategies, environmental adaptation, habits, and routines and behaviors.   IMPAIRMENTS are limiting patient from ADLs, IADLs, and social participation.   COMORBIDITIES may have co-morbidities  that affects occupational performance. Patient will benefit from skilled OT to address above impairments and improve overall function.  MODIFICATION OR ASSISTANCE TO COMPLETE EVALUATION: Min-Moderate modification of tasks or assist with assess necessary to complete an evaluation.  OT OCCUPATIONAL PROFILE AND HISTORY: Detailed assessment: Review of records and additional review of physical, cognitive, psychosocial history related to current functional performance.  CLINICAL DECISION MAKING: Moderate - several treatment options, min-mod task modification necessary  REHAB POTENTIAL: Good  EVALUATION COMPLEXITY: Moderate    PLAN: OT FREQUENCY: 2x/week  OT DURATION: 12 weeks  PLANNED INTERVENTIONS: self care/ADL training, therapeutic exercise, therapeutic activity, neuromuscular re-education, manual therapy, passive range of motion, and paraffin  RECOMMENDED OTHER SERVICES: ST, and PT  CONSULTED AND AGREED WITH PLAN OF CARE: Patient  PLAN FOR NEXT SESSION:  L hand strengthening and coordination exercises  Harrel Carina, MS, OTR/L

## 2022-08-28 ENCOUNTER — Ambulatory Visit: Payer: Medicare HMO

## 2022-08-28 ENCOUNTER — Ambulatory Visit: Payer: Medicare HMO | Admitting: Occupational Therapy

## 2022-08-28 ENCOUNTER — Encounter: Payer: Medicare HMO | Admitting: Speech Pathology

## 2022-08-28 DIAGNOSIS — R278 Other lack of coordination: Secondary | ICD-10-CM

## 2022-08-28 DIAGNOSIS — M6281 Muscle weakness (generalized): Secondary | ICD-10-CM | POA: Diagnosis not present

## 2022-08-28 DIAGNOSIS — R262 Difficulty in walking, not elsewhere classified: Secondary | ICD-10-CM

## 2022-08-28 DIAGNOSIS — R41841 Cognitive communication deficit: Secondary | ICD-10-CM

## 2022-08-28 DIAGNOSIS — R2681 Unsteadiness on feet: Secondary | ICD-10-CM

## 2022-08-28 DIAGNOSIS — I63511 Cerebral infarction due to unspecified occlusion or stenosis of right middle cerebral artery: Secondary | ICD-10-CM

## 2022-08-28 NOTE — Therapy (Addendum)
OUTPATIENT OCCUPATIONAL THERAPY TREATMENT NOTE   Patient Name: Alyssa Mayo MRN: [id number] DOB:Jul 11, 1946, 77 y.o., female Today's Date: 03/14/2022  PCP: [place] Pink, MD REFERRING PROVIDER: Frann Rider, NP  OT End of Session - 08/28/22 1421     Visit Number 38    Number of Visits 13    Date for OT Re-Evaluation 10/23/22    Authorization Type Progress reporting period starting 06/19/2022    OT Start Time 1100    OT Stop Time 1145    OT Time Calculation (min) 45 min    Activity Tolerance Patient tolerated treatment well    Behavior During Therapy [facility] for tasks assessed/performed                 Past Medical History:  Diagnosis Date   Abnormal levels of other serum enzymes 07/17/2015   Arthritis    Constipation 07/11/2015   COVID-19 03/06/2021   Diabetes mellitus without complication ([place])    Elevated liver enzymes    Fatty liver    Generalized abdominal pain 07/11/2015   Hyperlipidemia    Hypertension    Lifelong obesity    Macular degeneration    Morbid obesity due to excess calories ([place]) 07/11/2015   Other fatigue 07/11/2015   Sleep apnea    Spinal headache    Past Surgical History:  Procedure Laterality Date   ABDOMINAL HYSTERECTOMY     APPENDECTOMY     CATARACT EXTRACTION     CERVICAL LAMINECTOMY  07/21/1985   CESAREAN SECTION     COLONOSCOPY  07/21/2012   diverticulosis, [facility] Dr. Donnella Sham    COLONOSCOPY WITH PROPOFOL N/A 02/04/2019   Procedure: COLONOSCOPY WITH PROPOFOL;  Surgeon: Lollie Sails, MD;  Location: [facility] ENDOSCOPY;  Service: Endoscopy;  Laterality: N/A;   COLONOSCOPY WITH PROPOFOL N/A 03/18/2021   Procedure: COLONOSCOPY WITH PROPOFOL;  Surgeon: Lesly Rubenstein, MD;  Location: [facility] ENDOSCOPY;  Service: Endoscopy;  Laterality: N/A;  DM   DIAGNOSTIC LAPAROSCOPY     ESOPHAGOGASTRODUODENOSCOPY (EGD) WITH PROPOFOL N/A 02/04/2019   Procedure: ESOPHAGOGASTRODUODENOSCOPY (EGD) WITH PROPOFOL;  Surgeon: Lollie Sails,  MD;  Location: [facility] ENDOSCOPY;  Service: Endoscopy;  Laterality: N/A;   ESOPHAGOGASTRODUODENOSCOPY (EGD) WITH PROPOFOL N/A 03/18/2021   Procedure: ESOPHAGOGASTRODUODENOSCOPY (EGD) WITH PROPOFOL;  Surgeon: Lesly Rubenstein, MD;  Location: [facility] ENDOSCOPY;  Service: Endoscopy;  Laterality: N/A;   LOOP RECORDER INSERTION N/A 08/05/2021   Procedure: LOOP RECORDER INSERTION;  Surgeon: Evans Lance, MD;  Location: [facility] LAB;  Service: Cardiovascular;  Laterality: N/A;   [place]     TUBAL LIGATION     Patient Active Problem List   Diagnosis Date Noted   Paroxysmal atrial fibrillation ([place]) 08/08/2022   Hypercoagulable state due to paroxysmal atrial fibrillation ([place]) 08/08/2022   Cerebral edema ([place]) 08/07/2021   OSA (obstructive sleep apnea) 08/07/2021   Right middle cerebral artery stroke ([place]) 08/07/2021   CVA (cerebral vascular accident) ([place]) 08/01/2021   GERD (gastroesophageal reflux disease) 07/20/2019   Advanced care planning/counseling discussion 12/17/2016   Skin lesions, generalized 11/13/2016   Chronic right hip pain 06/17/2016   Vitamin D deficiency 09/26/2015   Diastasis recti 08/08/2015   Elevated serum GGT level 07/24/2015   Essential hypertension 07/17/2015   Fatty liver 07/17/2015   Elevated alkaline phosphatase level 07/17/2015   Elevated serum glutamic pyruvic transaminase (SGPT) level 07/17/2015   Abnormal finding on EKG 07/11/2015   Morbid obesity ([place]) 07/11/2015   Colon, diverticulosis  07/11/2015   Abdominal wall hernia 07/11/2015   DM (diabetes mellitus), type 2 with neurological complications (H[facility])    Hyperlipidemia     ONSET DATE: 08/01/2021  REFERRING DIAG: CVA  THERAPY DIAG:  Muscle weakness (generalized)  Rationale for Evaluation and Treatment Rehabilitation  SUBJECTIVE:   SUBJECTIVE STATEMENT:    Pt. reports that she is doing well today   Pt accompanied by: self  PERTINENT HISTORY:  Pt. is a 77 y.o. female who presents with  a diagnosis of RMCA CVA infarction with hemorrhagic transformation. Pt. attended inpatient rehab from 08/07/2021-08/22/2021. Pt. received Home health therapy services this past spring.  Pt. has recently had an assessment through driver rehabilitation services at [facility] with recommendations for referrals for  outpatient OT/PT, and ST services. Pt. PMHx includes: HTN, Hyperlipidemia, Macular degeneration, DM, and Obesity.  PRECAUTIONS: None  WEIGHT BEARING RESTRICTIONS No  PAIN:  Are you having pain? No   FALLS: Has patient fallen in last 6 months? Yes.  LIVING ENVIRONMENT: Lives with: lives with their family  Son  [name] Lives in: House/apartment Main living are on one floor Stairs:  2 steps to enter Has following equipment at home: Single point cane, Wheelchair (manual), shower chair, Grab bars, bed rail, and rubber mat.  PLOF: Independent  PATIENT GOALS: To be able to Drive, and cook again  OBJECTIVE:   HAND DOMINANCE: Right   ADLs: Overall ADLs:  Transfers/ambulation related to ADLs: Independent Eating: Independent Grooming: Pt. Fatigues with sustained BUE's in elevation for haircare. Especially with blow drying hair. UB Dressing: Independent pullover shirt, independent now with fastening a bra LB Dressing: Independent donning pants socks, and slide on shoes Toileting: Independent Bathing: Independent Tub Shower transfers: Walk-in shower Independent now   IADLs: Shopping: Needs to be accompanied to the grocery store. Light housekeeping: Does own laundry, laundry,  Meal Prep:  Difficulty with cooking Community mobility: Relies on son, and friend Medication management: Son sets up Mining engineer management: Manages monthly bills independently. Handwriting: No change from baseline  MOBILITY STATUS: Hx of falls out of bed   ACTIVITY TOLERANCE: Activity tolerance:  10-20 min. Before rest break  FUNCTIONAL OUTCOME MEASURES: FOTO: 57  TR score: 62  UPPER EXTREMITY ROM      Active ROM Right eval Left eval Left 05/06/2022 Left 05/27/2022 Left 06/19/2022  Left 07/31/2022  Shoulder flexion WFL 96(110) 107(110) 109(120) 112(126) 782(956)  Shoulder abduction WFL 82(105) 86(105) 92(115) 94(115) 96(116)  Shoulder adduction        Shoulder extension        Shoulder internal rotation        Shoulder external rotation        Elbow flexion [facility] WFL  Elbow extension [facility] WFL  Wrist flexion WFL 44(60) 68(68) WFL WFL WFL  Wrist extension WFL 32(60) 66(66) WFL WFL WFL  Wrist ulnar deviation        Wrist radial deviation        Wrist pronation        Wrist supination        (Blank rows = not tested)   UPPER EXTREMITY MMT:     MMT Right eval Left eval Left 05/06/2022 Left 05/27/2022 Left 06/19/2022 Left 07/31/2022  Shoulder flexion 4+/5 3-/5 3+/5 3+/5 3+/5 3+/5  Shoulder abduction 4+/5 3-/5 3-/5 3+/5 3+/5 3+/5  Shoulder adduction        Shoulder extension        Shoulder internal rotation  Shoulder external rotation        Middle trapezius        Lower trapezius        Elbow flexion 4+/5 4-/5 4+/5 5/5 5/5 5/5  Elbow extension 4+/5 4-/5 4+/5 5/5 5/5 5/5  Wrist flexion 4+/5 3-/5 4/5 4+/5 4+/5 4+/5  Wrist extension 4+/5 3/5 4/5 4+/5 4+/5 4+/5  Wrist ulnar deviation        Wrist radial deviation        Wrist pronation        Wrist supination        (Blank rows = not tested)  HAND FUNCTION: Grip strength: Right: 30 lbs; Left: 13 lbs, Lateral pinch: Right: 15 lbs, Left: 8 lbs, and 3 point pinch: Right: 13 lbs, Left: 7 lbs  05/06/2022 Grip strength: Right: 30 lbs; Left: 14 lbs, Lateral pinch: Right: 15 lbs, Left: 10 lbs, and 3 point pinch: Right: 13 lbs, Left: 7 lbs  05/27/2022:  Grip strength: Right: 30 lbs; Left: 17 lbs, Lateral pinch: Right: 15 lbs, Left: 11 lbs, and 3 point pinch: Right: 13 lbs, Left: 7 lbs  06/19/2022:  Grip strength: Right: 30 lbs; Left: 18 lbs, Lateral pinch: Right: 15 lbs, Left:  10 lbs,  and 3 point pinch: Right: 13 lbs, Left: 8 lbs  07/31/2022:   Grip strength: Right: 30 lbs; Left: 13 lbs, Lateral pinch: Right: 15 lbs, Left:  10 lbs, and 3 point pinch: Right: 13 lbs, Left: 10 lbs   COORDINATION:  9 Hole Peg test: Right: 26  sec; Left: 1 min. & 9 sec  05/06/2022 9 Hole Peg test: Right: 26  sec; Left: 1 min.  05/27/2022  9 Hole Peg test: Right: 26  sec; Left: 1 min. 4 sec.   06/19/2022  9 Hole Peg test: Right: 26  sec; Left: 1 min.   07/31/2022  9 Hole Peg test: Right: 26  sec; Left: 59 sec.    SENSATION: Light touch: WFL Proprioception: WFL  COGNITION: Overall cognitive status: Within functional limits for tasks assessed  VISION: Subjective report: Glasses. Just received a new prescription to increase bifocal strength Baseline vision: Macular Degeneration Visual history: macular degeneration  VISION ASSESSMENT: To be further assessed in functional context  Left sided Awareness    PERCEPTION: Limited left sided awareness   TODAY'S TREATMENT:       Self-care:  Pt. worked on [employer] copying a list of words using using mostly the left side of the hand, and keyboard. Pt. worked on accuracy with using the "A", "S", "D", and "F" keys with the left hand. Pt. worked on extending digits off of the keys. Pt. Worked on typing lists of words, and initiating typing sentences.   Therapeutic ex:  Pt. Worked on BUE strengthening, and reciprocal motion using the UBE while seated for 8 min. with no resistance. Constant monitoring was provided.    PATIENT EDUCATION: Education details: Compensatory strategies during typing. Person educated: Patient Education method: verbal cues Education comprehension: verbalized understanding and pt does not plan to cook unless son is present.   HOME EXERCISE PROGRAM:  Reviewed home activities to enhance left hand digit extension  during typing skills.    GOALS: Goals reviewed with patient? Yes   SHORT  TERM GOALS: Target date:  04/15/2022      Pt. Will improve FOTO score by 2 points to reflect improved pt. perceived functional performance Baseline: FOTO: 57, TR score: 62 10th visit: FOTO: 65, 20th visit: FOTO 62, TR  score: 62, 30th visit: FOTO score: 60 Goal status: Achieved  LONG TERM GOALS: Target date: 10/23/2022    Pt. Will improve left UE strength by 2 mm grades to assist with repositioning her grandson on her lap. Baseline: Eval: left shoulder flexion: 3-/5, abduction 3-/5,  elbow flex/ext. 4-/5, wrist extension 3/5, wrist flexion 3-/5  10th visit: left shoulder flexion: 3+/5, abduction 3-+5,  elbow flex/ext. 4+/5, wrist extension 4/5, wrist flexion 4/5 05/27/2022: left shoulder flexion: 3+/5, abduction 3+/5,  elbow flex/ext. 5/5, wrist extension 4+/5, wrist flexion 4+/5  20th visit: left shoulder flexion: 3+/5, abduction 3+/5,  elbow flex/ext. 5/5, wrist extension 4+/5, wrist flexion 4+/5. Pt. can now hold her grandson grandson on her lap, and reposition him. Pt. Is unable to hold him in standing. 30th visit: left shoulder flexion: 3+/5, abduction 3+/5,  elbow flex/ext. 5/5, wrist extension 4+/5, wrist flexion 4+/5 Goal status: Ongoing  2.  Pt. will improve left grip strength by 5# to be able to independently hold and use a blow dryer, and brush for hair care. Baseline: Eval: Left grip strength 13#. Pt. has difficulty performing hair care, holding and using a brush, and blow dryer. 10th visit: Left grip 14#. Pt. Is now able to hold th e blow dryer for the sides, and top of head, not the back. 11/07: Left 17# Pt. Has difficulty blow drying then back of her head.20th visit: Left grip 18#. Pt. Is able to independently hold a brush, and blow dry her hair.  Goal status: Achieved  3.  Pt. Will improve left lateral pinch strength by 3# to be able to independently cut meat. Baseline: Eval: Left 8, Pt. Has difficulty cutting meat. 10th: Left: 10#.  Pt. is able to cut tender meat, however is not as  efficient 11/07: Left 11#. Pt. Is independent cutting meat with increased time. 20th visit: Left: 10#. Pt. has difficulty positioning, and stabilizing the utensils while cutting meat. Goal status:  Achieved  4.  Pt. Will improve Left hand [facility] skills to be able to be able to independently, and efficiently manipulate small objects during ADL tasks. Baseline: Eval: left: 1 min. 9 sec. 10th visit: Left: 1 min. Pt.is improving with manipulating small objects. 11/07: Left: 1 min. & 4 sec. Pt. has difficulty manipulating small objects. 20th visit: Left:  1 min. Pt. is improving with grasping pills, however continues to have difficulty. 30th visit: Left:  1 min. Pt. Is improving with grasping pills, however continues to have difficulty. Goal status: Ongoing   5.  Pt. Will perform light meal preparation with modified independence Baseline: Eval: Pt. reports having difficulty performing light  meal preparation, and cooking tasks. 10th visit: Pt. is able to make toast in the morning, a sandwich,  use the microwave, make coffee, and pour herself a drink. 11/07: Pt. has difficulty holding a coffee pot when filling the reservoir 20th visit: Pt. Is making waffles in the toaster, heats items in the microwave. Pt. uses the oven when supervised. 30th visit: Pt. continues to perform light meal prep, hand heats in the microwave. Goal status: Ongoing  6.: Pt. will improve with left grip strength to be able to securely hold a drink.   Baseline: 30th visit: Left grip strength 13#. Pt. has difficulty holding a full drink  securely with the left hand.  Goal status: New  7. Pt. Will improve typing speed, and accuracy in preparation for efficiently typing simple email message.  Baseline: 30th visit: Typing net speed: 3 wpm, accuracy 48%  for a 1 min. Typing test.  Goal status: New        CLINICAL IMPRESSION:  Pt. Reports that it feels good to to loosen up her left arm on the UBE. Pt. continues to present with delayed  4th, and 5th digit extension required to consistently lift the fingers off of the keys causing the letters to be repeated multiple times in the series. Pt. presents with fewer errors, however consistently presents with mistypes with these 4 keys causing  letters to be repeated multiple times as Pt.'s attention shifts when navigating the keyboard for the remaining letters. Pt. continues to present with multiple errors, and requires cues to initiate corrections. Pt. needs continued work on improving left hand function during typing.         PERFORMANCE DEFICITS in functional skills including ADLs, IADLs, coordination, dexterity, sensation, ROM, strength, FMC, decreased knowledge of use of DME, vision, and UE functional use, cognitive skills including attention, and psychosocial skills including coping strategies, environmental adaptation, habits, and routines and behaviors.   IMPAIRMENTS are limiting patient from ADLs, IADLs, and social participation.   COMORBIDITIES may have co-morbidities  that affects occupational performance. Patient will benefit from skilled OT to address above impairments and improve overall function.  MODIFICATION OR ASSISTANCE TO COMPLETE EVALUATION: Min-Moderate modification of tasks or assist with assess necessary to complete an evaluation.  OT OCCUPATIONAL PROFILE AND HISTORY: Detailed assessment: Review of records and additional review of physical, cognitive, psychosocial history related to current functional performance.  CLINICAL DECISION MAKING: Moderate - several treatment options, min-mod task modification necessary  REHAB POTENTIAL: Good  EVALUATION COMPLEXITY: Moderate    PLAN: OT FREQUENCY: 2x/week  OT DURATION: 12 weeks  PLANNED INTERVENTIONS: self care/ADL training, therapeutic exercise, therapeutic activity, neuromuscular re-education, manual therapy, passive range of motion, and paraffin  RECOMMENDED OTHER SERVICES: ST, and PT  CONSULTED AND  AGREED WITH PLAN OF CARE: Patient  PLAN FOR NEXT SESSION:  L hand strengthening and coordination exercises  Harrel Carina, MS, OTR/L

## 2022-08-28 NOTE — Therapy (Signed)
OUTPATIENT PHYSICAL THERAPY NEURO TREATMENT    Patient Name: Alyssa Mayo MRN: [id number] DOB:1945-12-26, 77 y.o., female Today's Date: 08/28/2022   PCP: [place] Pink, MD REFERRING PROVIDER: Frann Rider, NP   PT End of Session - 08/28/22 1023     Visit Number 30    Number of Visits 38    Date for PT Re-Evaluation 09/16/22    Authorization Type [employer]    Authorization Time Period 04/02/2022-06/25/2022; 06/24/2022-09/16/2022    PT Start Time 1017    PT Stop Time 1100    PT Time Calculation (min) 43 min    Equipment Utilized During Treatment Gait belt    Activity Tolerance Patient tolerated treatment well                   Past Medical History:  Diagnosis Date   Abnormal levels of other serum enzymes 07/17/2015   Arthritis    Constipation 07/11/2015   COVID-19 03/06/2021   Diabetes mellitus without complication ([place])    Elevated liver enzymes    Fatty liver    Generalized abdominal pain 07/11/2015   Hyperlipidemia    Hypertension    Lifelong obesity    Macular degeneration    Morbid obesity due to excess calories ([place]) 07/11/2015   Other fatigue 07/11/2015   Sleep apnea    Spinal headache    Past Surgical History:  Procedure Laterality Date   ABDOMINAL HYSTERECTOMY     APPENDECTOMY     CATARACT EXTRACTION     CERVICAL LAMINECTOMY  07/21/1985   CESAREAN SECTION     COLONOSCOPY  07/21/2012   diverticulosis, [facility] Dr. Donnella Sham    COLONOSCOPY WITH PROPOFOL N/A 02/04/2019   Procedure: COLONOSCOPY WITH PROPOFOL;  Surgeon: Lollie Sails, MD;  Location: [facility] ENDOSCOPY;  Service: Endoscopy;  Laterality: N/A;   COLONOSCOPY WITH PROPOFOL N/A 03/18/2021   Procedure: COLONOSCOPY WITH PROPOFOL;  Surgeon: Lesly Rubenstein, MD;  Location: [facility] ENDOSCOPY;  Service: Endoscopy;  Laterality: N/A;  DM   DIAGNOSTIC LAPAROSCOPY     ESOPHAGOGASTRODUODENOSCOPY (EGD) WITH PROPOFOL N/A 02/04/2019   Procedure: ESOPHAGOGASTRODUODENOSCOPY (EGD) WITH PROPOFOL;   Surgeon: Lollie Sails, MD;  Location: [facility] ENDOSCOPY;  Service: Endoscopy;  Laterality: N/A;   ESOPHAGOGASTRODUODENOSCOPY (EGD) WITH PROPOFOL N/A 03/18/2021   Procedure: ESOPHAGOGASTRODUODENOSCOPY (EGD) WITH PROPOFOL;  Surgeon: Lesly Rubenstein, MD;  Location: [facility] ENDOSCOPY;  Service: Endoscopy;  Laterality: N/A;   LOOP RECORDER INSERTION N/A 08/05/2021   Procedure: LOOP RECORDER INSERTION;  Surgeon: Evans Lance, MD;  Location: [facility];  Service: Cardiovascular;  Laterality: N/A;   [place]     TUBAL LIGATION     Patient Active Problem List   Diagnosis Date Noted   Paroxysmal atrial fibrillation ([place]) 08/08/2022   Hypercoagulable state due to paroxysmal atrial fibrillation ([place]) 08/08/2022   Cerebral edema ([place]) 08/07/2021   OSA (obstructive sleep apnea) 08/07/2021   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/07/2021   CVA (cerebral vascular accident) ([place]) 08/01/2021   GERD (gastroesophageal reflux disease) 07/20/2019   Advanced care planning/counseling discussion 12/17/2016   Skin lesions, generalized 11/13/2016   Chronic right hip pain 06/17/2016   Vitamin D deficiency 09/26/2015   Diastasis recti 08/08/2015   Elevated serum GGT level 07/24/2015   Essential hypertension 07/17/2015   Fatty liver 07/17/2015   Elevated alkaline phosphatase level 07/17/2015   Elevated serum glutamic pyruvic transaminase (SGPT) level 07/17/2015   Abnormal finding on EKG 07/11/2015   Morbid obesity ([place]) 07/11/2015  Colon, diverticulosis 07/11/2015   Abdominal wall hernia 07/11/2015   DM (diabetes mellitus), type 2 with neurological complications [facility])    Hyperlipidemia     ONSET DATE: January 11th 2023  REFERRING DIAG:  I69.398,R26.9 (ICD-10-CM) - Gait disturbance, post-stroke  I63.511 (ICD-10-CM) - Right middle cerebral artery stroke (HCC)    THERAPY DIAG:  Muscle weakness (generalized)  Other lack of coordination  Right middle cerebral artery stroke  (HCC)  Cognitive communication deficit  Difficulty in walking, not elsewhere classified  Unsteadiness on feet  Rationale for Evaluation and Treatment Rehabilitation  SUBJECTIVE:  Pt denies any falls since the last time she was here.  Pt states her L shoulder is still bothering her, but nothing worse.  Pt has been placed on Eliquis since last visit as well.  Pt also placed on Nomenda to assist with her memory.  Pt states that her son, [name], believes that her memory has been declining recently.  Pt accompanied by: self; [name] her son also accompanying pt.   PERTINENT HISTORY: Per chart and confirmed by pt:  Pt is a 77 yo female with RMCA CVA infarction with hemorrhagic transformation 08/01/2021, with inpatient rehab 08/07/2021-08/22/2021 followed by [facility] therapy until June. Pt now currently being seen for outpatient OT and ST services. Pt reports limitations in stamina persist. Other PMH is significant for HTN, HLD, macular degeneration, DM, obesity, sleep apnea, arthritis, chronic R hip pain, vitamin D deficiency, diastasis recti, abdominal wall hernia, hx of abdominal hysterectomy, appendectomy, spine surgery (years ago).   PAIN:  Are you having pain?  0/10 Pain Location:     TODAY'S TREATMENT:   Goal assessment performed and noted below:  FGA: 26/30 6MWT: 1052' FOTO: 69      PATIENT EDUCATION: Education details: exam findings, indications, plan, HEP Person educated: Patient Education method: Explanation, Demonstration, Verbal cues, and Handouts Education comprehension: verbalized understanding, returned demonstration, verbal cues required, and needs further education   HOME EXERCISE PROGRAM:  Access Code: [email or web address] URL: [email or web address] Date: 06/02/2022 Prepared by: [name]  Exercises - Standing Single Leg Stance with Counter Support  - 2 x daily - 7 x weekly - 1 sets - 10 reps - 10s  hold - Standing Marching  - 2 x daily - 7 x weekly - 2 sets  - 20 reps - Side Stepping with Counter Support  - 2 x daily - 7 x weekly - 2 sets - 20 reps - Walking  - 2 x daily - 7 x weekly    GOALS:  Goals reviewed with patient? Yes  SHORT TERM GOALS: Target date: 10/09/2022  Patient will be independent in home exercise program to improve strength/mobility for better functional independence with ADLs. Baseline: initiated; Patient reports walking but not always independent with progressive HEP. Will keep goal active to anticipate more balance ex for HEP.  Goal status: PROGRESSING   LONG TERM GOALS: Target date: 11/20/2022  Patient will increase FOTO score to equal to or greater than 65  to demonstrate improvement in mobility and quality of life.  Baseline: 57 06/24/2022: 61 08/28/22: 69 Goal status: IN PROGRESS  2.  Patient (> 45 years old) will complete five times sit to stand test in < 15 seconds indicating an increased LE strength and improved balance. Baseline: 17 sec hands-free 05/21/2022 - 15.36 sec 06/24/2022 - 13.56 sec  Goal status: GOAL MET  3.  Patient will increase 10 meter walk test to >1.56m/s as to improve gait speed for better community ambulation and to  reduce fall risk. Baseline: 0.66 m/s; 11/1:  0.85 m/s 06/24/2022= 0.97 m/s without an AD 08/28/22: 1.02 m/s Goal status: GOAL MET  4.  Patient will increase Berg Balance score by > 3 points to demonstrate decreased fall risk during functional activities. Baseline: 50/56 06/24/2022: 52/56 08/28/22:  Goal status: IN PROGRESS  5.  Patient will increase six minute walk test distance to >1000 for progression to community ambulator and improve gait ability Baseline: 04/10/22: 653ft (pain onset at 5 minutes) 05/21/22: 1027 ft 06/23/2022= 940 feet - will keep goal active until patient able to consistently achieve > 1000 feet 08/28/22: 1052' ambulated Goal status: IN PROGRESS    ASSESSMENT:  CLINICAL IMPRESSION:  Pt has made significant progress towards goals and is ready for  discharge.  Pt has performed well throughout therapy and made significant progress in her ability to perform exercises without complication.  Pt demonstrates much improved balance as well, and has achieved all goals at this time.  Pt is ready to be d/c at this time.     OBJECTIVE IMPAIRMENTS Abnormal gait, decreased activity tolerance, decreased balance, decreased coordination, decreased endurance, decreased mobility, difficulty walking, decreased strength, impaired sensation, impaired UE functional use, improper body mechanics, postural dysfunction, and pain.   ACTIVITY LIMITATIONS carrying, lifting, bending, standing, squatting, stairs, bed mobility, bathing, reach over head, hygiene/grooming, and locomotion level  PARTICIPATION LIMITATIONS: meal prep, cleaning, laundry, medication management, personal finances, driving, shopping, community activity, and yard work  PERSONAL FACTORS Age, Sex, Time since onset of injury/illness/exacerbation, and 3+ comorbidities: Other PMH is significant for HTN, HLD, macular degeneration, DM, obesity, sleep apnea, arthritis, chronic R hip pain, vitamin D deficiency, diastasis recti, abdominal wall hernia, hx of abdominal hysterectomy, appendectomy, spine surgery (years ago).   are also affecting patient's functional outcome.   REHAB POTENTIAL: Good  CLINICAL DECISION MAKING: Evolving/moderate complexity  EVALUATION COMPLEXITY: Moderate  PLAN: PT FREQUENCY: 2x/week  PT DURATION: 12 weeks  PLANNED INTERVENTIONS: Therapeutic exercises, Therapeutic activity, Neuromuscular re-education, Balance training, Gait training, Patient/Family education, Self Care, Joint mobilization, Joint manipulation, Stair training, Vestibular training, Canalith repositioning, Orthotic/Fit training, DME instructions, Dry Needling, Electrical stimulation, Wheelchair mobility training, Spinal mobilization, Cryotherapy, Moist heat, Splintting, Taping, Traction, Ultrasound, Biofeedback,  Manual therapy, and Re-evaluation  PLAN FOR NEXT SESSION:   LE strengthening: leg press/squats/etc. HEP compliance and introduce more cognitive related tasks.    Gwenlyn Saran, PT, DPT Physical Therapist - [facility]  08/28/22, 1:33 PM

## 2022-09-01 ENCOUNTER — Emergency Department: Payer: Medicare HMO

## 2022-09-01 ENCOUNTER — Other Ambulatory Visit: Payer: Self-pay

## 2022-09-01 ENCOUNTER — Observation Stay
Admission: EM | Admit: 2022-09-01 | Discharge: 2022-09-03 | Disposition: A | Discharge status: Home / Self Care | Payer: Medicare HMO | Attending: Family Medicine | Admitting: Family Medicine

## 2022-09-01 DIAGNOSIS — E1149 Type 2 diabetes mellitus with other diabetic neurological complication: Secondary | ICD-10-CM | POA: Diagnosis not present

## 2022-09-01 DIAGNOSIS — Z7901 Long term (current) use of anticoagulants: Secondary | ICD-10-CM | POA: Diagnosis not present

## 2022-09-01 DIAGNOSIS — M25512 Pain in left shoulder: Secondary | ICD-10-CM | POA: Diagnosis not present

## 2022-09-01 DIAGNOSIS — Z7984 Long term (current) use of oral hypoglycemic drugs: Secondary | ICD-10-CM | POA: Diagnosis not present

## 2022-09-01 DIAGNOSIS — I69398 Other sequelae of cerebral infarction: Secondary | ICD-10-CM | POA: Diagnosis not present

## 2022-09-01 DIAGNOSIS — I130 Hypertensive heart and chronic kidney disease with heart failure and stage 1 through stage 4 chronic kidney disease, or unspecified chronic kidney disease: Secondary | ICD-10-CM | POA: Insufficient documentation

## 2022-09-01 DIAGNOSIS — N3000 Acute cystitis without hematuria: Secondary | ICD-10-CM

## 2022-09-01 DIAGNOSIS — N1831 Chronic kidney disease, stage 3a: Secondary | ICD-10-CM | POA: Diagnosis not present

## 2022-09-01 DIAGNOSIS — R531 Weakness: Secondary | ICD-10-CM | POA: Diagnosis present

## 2022-09-01 DIAGNOSIS — I10 Essential (primary) hypertension: Secondary | ICD-10-CM | POA: Diagnosis present

## 2022-09-01 DIAGNOSIS — U071 COVID-19: Secondary | ICD-10-CM | POA: Diagnosis present

## 2022-09-01 DIAGNOSIS — G4733 Obstructive sleep apnea (adult) (pediatric): Secondary | ICD-10-CM | POA: Diagnosis present

## 2022-09-01 DIAGNOSIS — F0390 Unspecified dementia without behavioral disturbance: Secondary | ICD-10-CM | POA: Diagnosis not present

## 2022-09-01 DIAGNOSIS — E669 Obesity, unspecified: Secondary | ICD-10-CM | POA: Diagnosis present

## 2022-09-01 DIAGNOSIS — E1122 Type 2 diabetes mellitus with diabetic chronic kidney disease: Secondary | ICD-10-CM | POA: Insufficient documentation

## 2022-09-01 DIAGNOSIS — E785 Hyperlipidemia, unspecified: Secondary | ICD-10-CM | POA: Diagnosis not present

## 2022-09-01 DIAGNOSIS — I5032 Chronic diastolic (congestive) heart failure: Secondary | ICD-10-CM | POA: Diagnosis present

## 2022-09-01 DIAGNOSIS — N390 Urinary tract infection, site not specified: Secondary | ICD-10-CM | POA: Diagnosis not present

## 2022-09-01 DIAGNOSIS — Z79899 Other long term (current) drug therapy: Secondary | ICD-10-CM | POA: Diagnosis not present

## 2022-09-01 DIAGNOSIS — I480 Paroxysmal atrial fibrillation: Secondary | ICD-10-CM | POA: Diagnosis not present

## 2022-09-01 DIAGNOSIS — I639 Cerebral infarction, unspecified: Secondary | ICD-10-CM | POA: Diagnosis present

## 2022-09-01 DIAGNOSIS — M6281 Muscle weakness (generalized): Secondary | ICD-10-CM | POA: Diagnosis not present

## 2022-09-01 LAB — CBC WITH DIFFERENTIAL/PLATELET
Abs Immature Granulocytes: 0.06 10*3/uL (ref 0.00–0.07)
Basophils Absolute: 0 10*3/uL (ref 0.0–0.1)
Basophils Relative: 0 %
Eosinophils Absolute: 0 10*3/uL (ref 0.0–0.5)
Eosinophils Relative: 0 %
HCT: 44 % (ref 36.0–46.0)
Hemoglobin: 14.2 g/dL (ref 12.0–15.0)
Immature Granulocytes: 1 %
Lymphocytes Relative: 6 %
Lymphs Abs: 0.7 10*3/uL (ref 0.7–4.0)
MCH: 29.5 pg (ref 26.0–34.0)
MCHC: 32.3 g/dL (ref 30.0–36.0)
MCV: 91.5 fL (ref 80.0–100.0)
Monocytes Absolute: 0.7 10*3/uL (ref 0.1–1.0)
Monocytes Relative: 6 %
Neutro Abs: 9.6 10*3/uL — ABNORMAL HIGH (ref 1.7–7.7)
Neutrophils Relative %: 87 %
Platelets: 217 10*3/uL (ref 150–400)
RBC: 4.81 MIL/uL (ref 3.87–5.11)
RDW: 13.9 % (ref 11.5–15.5)
WBC: 11.1 10*3/uL — ABNORMAL HIGH (ref 4.0–10.5)
nRBC: 0 % (ref 0.0–0.2)

## 2022-09-01 LAB — COMPREHENSIVE METABOLIC PANEL
ALT: 27 U/L (ref 0–44)
AST: 29 U/L (ref 15–41)
Albumin: 4.1 g/dL (ref 3.5–5.0)
Alkaline Phosphatase: 98 U/L (ref 38–126)
Anion gap: 11 (ref 5–15)
BUN: 26 mg/dL — ABNORMAL HIGH (ref 8–23)
CO2: 25 mmol/L (ref 22–32)
Calcium: 9.1 mg/dL (ref 8.9–10.3)
Chloride: 104 mmol/L (ref 98–111)
Creatinine, Ser: 1.1 mg/dL — ABNORMAL HIGH (ref 0.44–1.00)
GFR, Estimated: 52 mL/min — ABNORMAL LOW (ref >60)
Glucose, Bld: 149 mg/dL — ABNORMAL HIGH (ref 70–99)
Potassium: 4.1 mmol/L (ref 3.5–5.1)
Sodium: 140 mmol/L (ref 135–145)
Total Bilirubin: 0.7 mg/dL (ref 0.3–1.2)
Total Protein: 7.2 g/dL (ref 6.5–8.1)

## 2022-09-01 LAB — URINALYSIS, ROUTINE W REFLEX MICROSCOPIC
Bilirubin Urine: NEGATIVE
Glucose, UA: >=500 mg/dL — AB
Hgb urine dipstick: NEGATIVE
Ketones, ur: 5 mg/dL — AB
Nitrite: NEGATIVE
Protein, ur: NEGATIVE mg/dL
Specific Gravity, Urine: 1.018 (ref 1.005–1.030)
pH: 5 (ref 5.0–8.0)

## 2022-09-01 LAB — CBG MONITORING, ED
Glucose-Capillary: 101 mg/dL — ABNORMAL HIGH (ref 70–99)
Glucose-Capillary: 153 mg/dL — ABNORMAL HIGH (ref 70–99)
Glucose-Capillary: 73 mg/dL (ref 70–99)

## 2022-09-01 LAB — MAGNESIUM: Magnesium: 1.7 mg/dL (ref 1.7–2.4)

## 2022-09-01 LAB — GLUCOSE, CAPILLARY: Glucose-Capillary: 110 mg/dL — ABNORMAL HIGH (ref 70–99)

## 2022-09-01 LAB — RESP PANEL BY RT-PCR (RSV, FLU A&B, COVID)  RVPGX2
Influenza A by PCR: NEGATIVE
Influenza B by PCR: NEGATIVE
Resp Syncytial Virus by PCR: NEGATIVE
SARS Coronavirus 2 by RT PCR: POSITIVE — AB

## 2022-09-01 LAB — BRAIN NATRIURETIC PEPTIDE: B Natriuretic Peptide: 60.7 pg/mL (ref 0.0–100.0)

## 2022-09-01 MED ORDER — ATORVASTATIN CALCIUM 20 MG PO TABS
20.0000 mg | ORAL_TABLET | Freq: Every day | ORAL | Status: DC
  Administered 2022-09-01 – 2022-09-02 (×2): 20 mg via ORAL
  Filled 2022-09-01 (×2): qty 1

## 2022-09-01 MED ORDER — SODIUM CHLORIDE 0.9 % IV SOLN
2.0000 g | Freq: Once | INTRAVENOUS | Status: AC
  Administered 2022-09-01: 2 g via INTRAVENOUS
  Filled 2022-09-01: qty 20

## 2022-09-01 MED ORDER — ONDANSETRON HCL 4 MG/2ML IJ SOLN: 4.0000 mg | Freq: Three times a day (TID) | INTRAMUSCULAR | Status: DC | PRN

## 2022-09-01 MED ORDER — LACTATED RINGERS IV BOLUS
1000.0000 mL | Freq: Once | INTRAVENOUS | Status: AC
  Administered 2022-09-01: 1000 mL via INTRAVENOUS

## 2022-09-01 MED ORDER — ACETAMINOPHEN 325 MG PO TABS
650.0000 mg | ORAL_TABLET | Freq: Four times a day (QID) | ORAL | Status: DC | PRN
  Administered 2022-09-01 – 2022-09-02 (×3): 650 mg via ORAL
  Filled 2022-09-01 (×3): qty 2

## 2022-09-01 MED ORDER — HYDRALAZINE HCL 20 MG/ML IJ SOLN: 5.0000 mg | INTRAMUSCULAR | Status: DC | PRN

## 2022-09-01 MED ORDER — MEMANTINE HCL 5 MG PO TABS
10.0000 mg | ORAL_TABLET | Freq: Two times a day (BID) | ORAL | Status: DC
  Administered 2022-09-01 – 2022-09-03 (×5): 10 mg via ORAL
  Filled 2022-09-01 (×5): qty 2

## 2022-09-01 MED ORDER — CELECOXIB 200 MG PO CAPS
200.0000 mg | ORAL_CAPSULE | Freq: Two times a day (BID) | ORAL | Status: DC
  Administered 2022-09-01 – 2022-09-03 (×5): 200 mg via ORAL
  Filled 2022-09-01 (×6): qty 1

## 2022-09-01 MED ORDER — MAGNESIUM OXIDE 400 MG PO TABS
400.0000 mg | ORAL_TABLET | Freq: Every day | ORAL | Status: DC
  Administered 2022-09-01 – 2022-09-02 (×2): 400 mg via ORAL
  Filled 2022-09-01 (×4): qty 1

## 2022-09-01 MED ORDER — INSULIN ASPART 100 UNIT/ML IJ SOLN
0.0000 [IU] | Freq: Three times a day (TID) | INTRAMUSCULAR | Status: DC
  Administered 2022-09-01 – 2022-09-02 (×3): 2 [IU] via SUBCUTANEOUS
  Administered 2022-09-03: 1 [IU] via SUBCUTANEOUS
  Filled 2022-09-01 (×4): qty 1

## 2022-09-01 MED ORDER — SODIUM CHLORIDE 0.9 % IV BOLUS: 500.0000 mL | Freq: Once | INTRAVENOUS | Status: DC

## 2022-09-01 MED ORDER — APIXABAN 5 MG PO TABS
5.0000 mg | ORAL_TABLET | Freq: Two times a day (BID) | ORAL | Status: DC
  Administered 2022-09-01 – 2022-09-03 (×5): 5 mg via ORAL
  Filled 2022-09-01 (×5): qty 1

## 2022-09-01 MED ORDER — PANTOPRAZOLE SODIUM 40 MG PO TBEC
40.0000 mg | DELAYED_RELEASE_TABLET | Freq: Every day | ORAL | Status: DC
  Administered 2022-09-01 – 2022-09-03 (×3): 40 mg via ORAL
  Filled 2022-09-01 (×3): qty 1

## 2022-09-01 MED ORDER — AMLODIPINE BESYLATE 5 MG PO TABS
2.5000 mg | ORAL_TABLET | Freq: Every day | ORAL | Status: DC
  Administered 2022-09-01 – 2022-09-03 (×3): 2.5 mg via ORAL
  Filled 2022-09-01 (×3): qty 1

## 2022-09-01 MED ORDER — SODIUM CHLORIDE 0.9 % IV SOLN
2.0000 g | INTRAVENOUS | Status: DC
  Administered 2022-09-02 – 2022-09-03 (×2): 2 g via INTRAVENOUS
  Filled 2022-09-01 (×2): qty 2

## 2022-09-01 MED ORDER — ACETAMINOPHEN 500 MG PO TABS
1000.0000 mg | ORAL_TABLET | Freq: Once | ORAL | Status: AC
  Administered 2022-09-01: 1000 mg via ORAL
  Filled 2022-09-01: qty 2

## 2022-09-01 MED ORDER — GABAPENTIN 300 MG PO CAPS
400.0000 mg | ORAL_CAPSULE | Freq: Every day | ORAL | Status: DC
  Administered 2022-09-01: 400 mg via ORAL
  Filled 2022-09-01: qty 1

## 2022-09-01 MED ORDER — INSULIN ASPART 100 UNIT/ML IJ SOLN: 0.0000 [IU] | Freq: Every day | INTRAMUSCULAR | Status: DC

## 2022-09-01 MED ORDER — ENOXAPARIN SODIUM 40 MG/0.4ML IJ SOSY: 40.0000 mg | PREFILLED_SYRINGE | INTRAMUSCULAR | Status: DC

## 2022-09-01 MED ORDER — ALBUTEROL SULFATE (2.5 MG/3ML) 0.083% IN NEBU: 3.0000 mL | INHALATION_SOLUTION | RESPIRATORY_TRACT | Status: DC | PRN

## 2022-09-01 MED ORDER — SUMATRIPTAN SUCCINATE 50 MG PO TABS
50.0000 mg | ORAL_TABLET | Freq: Once | ORAL | Status: AC
  Administered 2022-09-01: 50 mg via ORAL
  Filled 2022-09-01: qty 1

## 2022-09-01 MED ORDER — DM-GUAIFENESIN ER 30-600 MG PO TB12
1.0000 | ORAL_TABLET | Freq: Two times a day (BID) | ORAL | Status: DC | PRN
  Administered 2022-09-01 – 2022-09-02 (×2): 1 via ORAL
  Filled 2022-09-01 (×2): qty 1

## 2022-09-01 MED ORDER — METOPROLOL TARTRATE 5 MG/5ML IV SOLN: 2.5000 mg | INTRAVENOUS | Status: DC | PRN

## 2022-09-01 MED ORDER — PROSIGHT PO TABS
1.0000 | ORAL_TABLET | Freq: Two times a day (BID) | ORAL | Status: DC
  Administered 2022-09-01: 1 via ORAL
  Filled 2022-09-01 (×4): qty 1

## 2022-09-01 NOTE — ED Provider Notes (Signed)
[facility] Provider Note    Event Date/Time   First MD Initiated Contact with Patient 09/01/22 0522     (approximate)   History   Weakness   HPI  Alyssa Mayo is a 77 y.o. female who presents to the ED for evaluation of Weakness   I review a 2/5 neurology clinic visit.  History of a stroke last year and has some residual short-term memory loss.  Patient presents from home via EMS for evaluation of generalized weakness.  She tested positive for COVID-19 on a home antigen test yesterday is been feeling unwell for the past couple days with generalized malaise and weakness.  She reports poor appetite without abdominal pain, on emesis or diarrhea.  No chest pain, dyspnea or cough.  Just reports generalized weakness.  She was sitting on a stool by the side of her bed this morning as she was getting up and felt very weak and reports sitting there for at least an hour and was unable to even stand up.  No falls, syncope or injuries.  She had to call 911 for a lift assist and she comes here to get evaluated.  She is a cane at home, but typically does not use it and ambulates independently.  Lives at home with her son   Physical Exam   Triage Vital Signs: ED Triage Vitals  Enc Vitals Group     BP 09/01/22 0513 (!) 148/58     Pulse Rate 09/01/22 0513 (!) 105     Resp 09/01/22 0513 20     Temp 09/01/22 0513 99.2 F (37.3 C)     Temp Source 09/01/22 0513 Oral     SpO2 09/01/22 0513 98 %     Weight 09/01/22 0514 190 lb (86.2 kg)     Height 09/01/22 0514 5' 4"$  (1.626 m)     Head Circumference --      Peak Flow --      Pain Score 09/01/22 0514 0     Pain Loc --      Pain Edu? --      Excl. in [place]? --     Most recent vital signs: Vitals:   09/01/22 0600 09/01/22 0630  BP: (!) 150/71 135/77  Pulse: (!) 101 (!) 103  Resp: (!) 22 20  Temp:    SpO2: 100% 96%    General: Awake, no distress.  Pleasant, fluent and conversational CV:  Good peripheral  perfusion.  Resp:  Normal effort.  Abd:  No distention.  Soft and benign MSK:  No deformity noted.  No signs of trauma Neuro:  No focal deficits appreciated. Cranial nerves II through XII intact 5/5 strength and sensation in all 4 extremities Other:     ED Results / Procedures / Treatments   Labs (all labs ordered are listed, but only abnormal results are displayed) Labs Reviewed  COMPREHENSIVE METABOLIC PANEL - Abnormal; Notable for the following components:      Result Value   Glucose, Bld 149 (*)    BUN 26 (*)    Creatinine, Ser 1.10 (*)    GFR, Estimated 52 (*)    All other components within normal limits  CBC WITH DIFFERENTIAL/PLATELET - Abnormal; Notable for the following components:   WBC 11.1 (*)    Neutro Abs 9.6 (*)    All other components within normal limits  MAGNESIUM  URINALYSIS, ROUTINE W REFLEX MICROSCOPIC    EKG Sinus tachycardia with a rate of 106  bpm.  Rightward axis and normal intervals.  No acute signs of acute ischemia.  RADIOLOGY CXR interpreted by me without evidence of acute cardiopulmonary pathology.  Official radiology report(s): DG Chest Port 1 View  Result Date: 09/01/2022 CLINICAL DATA:  Positive COVID-19 home test.  [id number].  Weakness. EXAM: PORTABLE CHEST 1 VIEW COMPARISON:  AP and lateral 08/15/2021. FINDINGS: 5:43 a.m. Stable mild-to-moderate cardiomegaly. No vascular congestion is seen. A loop recorder device again superimposes in the left chest wall. There is calcification in the transverse aorta. The mediastinum is normally outlined. The lungs are clear. The sulci are sharp. Thoracic spondylosis. IMPRESSION: 1. No evidence of acute chest disease. Stable cardiomegaly. 2. Aortic atherosclerosis. 3. Loop recorder device. Electronically Signed   By: Telford Nab M.D.   On: 09/01/2022 06:20    PROCEDURES and INTERVENTIONS:  .1-3 Lead EKG Interpretation  Performed by: Vladimir Crofts, MD Authorized by: Vladimir Crofts, MD     Interpretation:  abnormal     ECG rate:  102   ECG rate assessment: tachycardic     Rhythm: sinus tachycardia     Ectopy: none     Conduction: normal     Medications  acetaminophen (TYLENOL) tablet 1,000 mg (1,000 mg Oral Given 09/01/22 0526)  lactated ringers bolus 1,000 mL (1,000 mLs Intravenous New Bag/Given 09/01/22 0526)     IMPRESSION / MDM / ASSESSMENT AND PLAN / ED COURSE  I reviewed the triage vital signs and the nursing notes.  Differential diagnosis includes, but is not limited to, symptomatic COVID, AKI, dehydration or electrolyte derangement, pneumonia, sepsis, acute cystitis  {Patient presents with symptoms of an acute illness or injury that is potentially life-threatening.  Pleasant 78 year old woman presents from home with generalized weakness associated with a positive home COVID test.  She has no focal neurologic deficits or focal weakness.  Generalized malaise consistent with possible COVID-19 symptoms.  Mild sinus tachycardia is noted but hemodynamically stable.  CBC with marginal leukocytosis and neutrophilia.  Metabolic panel with renal dysfunction around baseline.  Normal magnesium.  CXR is clear.  Will provide Tylenol, IV fluids and reassess with an ambulatory trial.  Awaiting urinalysis to assess for cystitis.      FINAL CLINICAL IMPRESSION(S) / ED DIAGNOSES   Final diagnoses:  Generalized weakness     Rx / DC Orders   ED Discharge Orders     None        Note:  This document was prepared using Dragon voice recognition software and may include unintentional dictation errors.   Vladimir Crofts, MD 09/01/22 [phone]

## 2022-09-01 NOTE — ED Triage Notes (Signed)
Pt from home for weakness.  Pt sat in a stool for 2 hours because she was unable to stand afterwards.  Pt also c/o chronic L shoulder pain (13 months) and wants that to be addressed.  Pt CoVID positive yesterday per home test.

## 2022-09-01 NOTE — Evaluation (Signed)
Occupational Therapy Evaluation Patient Details Name: Alyssa Mayo MRN: [id number] DOB: December 31, 1945 Today's Date: 09/01/2022   History of Present Illness Patient is a 77 y/o female presenting with UTI and is Covid+. Hx of CVA (07/2021) with residual L shoulder pain; OSA, a-fib, CHF, CKD3, DM2. Has been working with [facility] for several months.   Clinical Impression   Patient received for OT evaluation. See flowsheet below for details of function. Generally, patient requiring supervision for bed mobility, supervision for functional mobility, and supervision with rest breaks for ADLs. Pt presenting with decreased activity tolerance following Covid dx. Is close to baseline function. General malaise today. Runny nose. Pt denies shortness of breath. On room air; O2 oximeter not working well; after RN in room to fix it RN reported O2 sats in 90's. Patient will benefit from continued OT while in acute care.       Recommendations for follow up therapy are one component of a multi-disciplinary discharge planning process, led by the attending physician.  Recommendations may be updated based on patient status, additional functional criteria and insurance authorization.   Follow Up Recommendations  Outpatient OT (continue OPOT as prior to admission)     Assistance Recommended at Discharge Intermittent Supervision/Assistance  Patient can return home with the following Assistance with cooking/housework;Direct supervision/assist for medications management;Assist for transportation;Direct supervision/assist for financial management    Functional Status Assessment  Patient has had a recent decline in their functional status and demonstrates the ability to make significant improvements in function in a reasonable and predictable amount of time.  Equipment Recommendations  None recommended by OT    Recommendations for Other Services       Precautions / Restrictions Precautions Precautions:  Fall Precaution Comments: Isolation- airborne/contact for Covid+ Restrictions Weight Bearing Restrictions: No      Mobility Bed Mobility Overal bed mobility: Modified Independent             General bed mobility comments: Able to t/f supine to sit with extra time and use of bed rail.    Transfers Overall transfer level: Needs assistance Equipment used: None Transfers: Sit to/from Stand Sit to Stand: Supervision           General transfer comment: Pt fatigued and moving slowly, but no hands-on assist required      Balance                                           ADL either performed or assessed with clinical judgement   ADL Overall ADL's : At baseline (with decreased activity tolerance currently 2/2 UTI and Covid infection)                                       General ADL Comments: Pt able to slip on her shoes today (states she gets her toenails done at baseline 2/2 not being able to reach them); states she has been up to the [facility] without assistance. Has changed underwear prior to session.     Vision         Perception     Praxis      Pertinent Vitals/Pain Pain Assessment Pain Assessment: 0-10 Pain Score:  (unrated; moderate) Pain Location: headache Pain Descriptors / Indicators: Headache     Hand Dominance  Extremity/Trunk Assessment Upper Extremity Assessment Upper Extremity Assessment: Overall WFL for tasks assessed (has LUE deficits in shoulder being addressed by OPOT)   Lower Extremity Assessment Lower Extremity Assessment: Defer to PT evaluation;Overall WFL for tasks assessed       Communication Communication Communication: No difficulties   Cognition Arousal/Alertness: Awake/alert Behavior During Therapy: WFL for tasks assessed/performed Overall Cognitive Status: Within Functional Limits for tasks assessed                                 General Comments: Pt reports she is taking  Namenda for memory deficits; but is able to provide hx.     General Comments  Pt walked to the door and back (approx 12 feet) 3x then seated rest on EOB; pt stood once more for OT to change bed pad; t/f back into bed with supervision. O2 checked during session; 77% on room air at lowest, but very poor waveform on the monitor; OT alerted RN to come check pulseox and tele leads (alarm going for leads off). Pt reporting not being short of breath at rest or with activity.    Exercises     Shoulder Instructions      Home Living Family/patient expects to be discharged to:: Private residence Living Arrangements: Children Available Help at Discharge: Family (son is available 24/7 PRN typically; pt states her son is away until Wednesday (2 days from now) on a school fieldtrip with pt's grandson.) Type of Home: House Home Access: Stairs to enter [employer] of Steps: 2   Home Layout: One level     Bathroom Shower/Tub: Occupational psychologist: [place]: Cane - single point;Shower seat;Other (comment);Wheelchair - manual (nonskid mat in shower; bed rail)          Prior Functioning/Environment Prior Level of Function : Needs assist  Cognitive Assist : ADLs (cognitive)   ADLs (Cognitive):  (Per pt and medical record, she has started on Namenda for memory changes and is tolerating well.) Physical Assist : ADLs (physical)   ADLs (physical): IADLs (son assists with meal prepartation, driving.) Mobility Comments: Pt does not use AD at baseline; is working with OPPT ADLs Comments: Pt is working with OPOT on [facility] and L shoulder pain/ROM. (I) ADLs; states she is unable to get down to her feet, so she uses slip on shoes.        OT Problem List: Decreased activity tolerance      OT Treatment/Interventions: Therapeutic exercise;Self-care/ADL training;Energy conservation;Therapeutic activities    OT Goals(Current goals can be found in the care plan  section) Acute Rehab OT Goals Patient Stated Goal: Get better OT Goal Formulation: With patient Time For Goal Achievement: 09/15/22 Potential to Achieve Goals: Good ADL Goals Additional ADL Goal #1: Pt will perform all ADLs with MOD (I) with baseline activity tolerance by d/c.  OT Frequency: Min 1X/week    Co-evaluation              AM-PAC OT "6 Clicks" Daily Activity     Outcome Measure Help from another person eating meals?: None Help from another person taking care of personal grooming?: None Help from another person toileting, which includes using toliet, bedpan, or urinal?: None Help from another person bathing (including washing, rinsing, drying)?: A Little Help from another person to put on and taking off regular upper body clothing?: None Help from another person to put  on and taking off regular lower body clothing?: None 6 Click Score: 23   End of Session Nurse Communication: Mobility status;Other (comment) (need for check of O2 and telemetry)  Activity Tolerance: Patient tolerated treatment well Patient left: in bed;with bed alarm set;with call bell/phone within reach  OT Visit Diagnosis: Muscle weakness (generalized) (M62.81)                Time: [id number] OT Time Calculation (min): 18 min Charges:  OT General Charges $OT Visit: 1 Visit OT Evaluation $OT Eval Low Complexity: 1 Low  Waymon Amato, MS, OTR/L   Vania Rea 09/01/2022, 3:30 PM

## 2022-09-01 NOTE — ED Provider Notes (Signed)
77 yo F here with generalized weakness, positive COVID test. Difficulty ambulating, lives with son who will be away for then ext several days. Decreased appetite. UA positive for UTI. COVID pending but had home + test. Will admit to Hospitalist service.   Duffy Bruce, MD 09/01/22 [phone]

## 2022-09-01 NOTE — H&P (Signed)
History and Physical    Alyssa Mayo [id number] DOB: [birth year] DOA: 09/01/2022  Referring MD/NP/PA:   PCP: [place] Pink, MD   Patient coming from:  The patient is coming from home.    Chief Complaint: cough, increased urinary frequency, weakness  HPI: Alyssa Mayo is a 77 y.o. female with medical history significant of A fib on Eliquis, HTN, HLD, DM, dCHF, stroke, CKD-3a, dementia, who presents with cough, increased urinary frequency, weakness.  Pt states that she has generalized weakness since yesterday. She feels so weak that she has difficulty standing up from sitting position.  No unilateral numbness or tinglings in extremities.  No facial droop or slurred speech.  She has a poor appetite, decreased oral intake.  Patient has cough with little mucus production, no chest pain, shortness of breath.  She has mild subjective fever and chills.  She also reports increased urinary frequency, but denies dysuria or burning on urination.  Denies nausea, vomiting, diarrhea or abdominal pain.  She states that she was tested positive for COVID to home yesterday.  She states that she has chronic left shoulder pain  Data reviewed independently and ED Course: pt was found to have positive COVID PCR, positive urinalysis (clear appearance, small amount of leukocyte, rare bacteria, WBC 21-50), WBC 11.1, renal function close to baseline, temperature 99.2, blood pressure 135/77, heart rate of 100-1 20, 24, 19, oxygen saturation 100% on room air.  Chest x-ray negative.  Patient is placed on TeleMed for observation   EKG: I have personally reviewed.  Sinus rhythm, QTc 409, low voltage.   Review of Systems:   General: has subjective fevers, chills, no body weight gain, has poor appetite, has fatigue HEENT: no blurry vision, hearing changes or sore throat Respiratory: has coughing, no SOB and wheezing CV: no chest pain, no palpitations GI: no nausea, vomiting, abdominal pain, diarrhea,  constipation GU: no dysuria, burning on urination, has increased urinary frequency, no hematuria  Ext: no leg edema Neuro: no unilateral weakness, numbness, or tingling, no vision change or hearing loss Skin: no rash, no skin tear. MSK: No muscle spasm, no deformity, no limitation of range of movement in spin Heme: No easy bruising.  Travel history: No recent long distant travel.   Allergy:  Allergies  Allergen Reactions   Codeine Other (See Comments)    Makes her very loopy and groggy   Neosporin [Neomycin-Bacitracin Zn-Polymyx] Hives   Ozempic [Semaglutide] Hives   Polymyxin B Other (See Comments)    Eyes redness   Sulfa Antibiotics Itching    Past Medical History:  Diagnosis Date   Abnormal levels of other serum enzymes 07/17/2015   Arthritis    Constipation 07/11/2015   COVID-19 03/06/2021   Diabetes mellitus without complication ([place])    Elevated liver enzymes    Fatty liver    Generalized abdominal pain 07/11/2015   Hyperlipidemia    Hypertension    Lifelong obesity    Macular degeneration    Morbid obesity due to excess calories ([place]) 07/11/2015   Other fatigue 07/11/2015   Sleep apnea    Spinal headache     Past Surgical History:  Procedure Laterality Date   ABDOMINAL HYSTERECTOMY     APPENDECTOMY     CATARACT EXTRACTION     CERVICAL LAMINECTOMY  07/21/1985   CESAREAN SECTION     COLONOSCOPY  07/21/2012   diverticulosis, [facility] Dr. Donnella Sham    COLONOSCOPY WITH PROPOFOL N/A 02/04/2019   Procedure: COLONOSCOPY WITH PROPOFOL;  Surgeon:  Lollie Sails, MD;  Location: [facility] ENDOSCOPY;  Service: Endoscopy;  Laterality: N/A;   COLONOSCOPY WITH PROPOFOL N/A 03/18/2021   Procedure: COLONOSCOPY WITH PROPOFOL;  Surgeon: Lesly Rubenstein, MD;  Location: [facility] ENDOSCOPY;  Service: Endoscopy;  Laterality: N/A;  DM   DIAGNOSTIC LAPAROSCOPY     ESOPHAGOGASTRODUODENOSCOPY (EGD) WITH PROPOFOL N/A 02/04/2019   Procedure: ESOPHAGOGASTRODUODENOSCOPY (EGD) WITH PROPOFOL;   Surgeon: Lollie Sails, MD;  Location: [facility] ENDOSCOPY;  Service: Endoscopy;  Laterality: N/A;   ESOPHAGOGASTRODUODENOSCOPY (EGD) WITH PROPOFOL N/A 03/18/2021   Procedure: ESOPHAGOGASTRODUODENOSCOPY (EGD) WITH PROPOFOL;  Surgeon: Lesly Rubenstein, MD;  Location: [facility] ENDOSCOPY;  Service: Endoscopy;  Laterality: N/A;   LOOP RECORDER INSERTION N/A 08/05/2021   Procedure: LOOP RECORDER INSERTION;  Surgeon: Evans Lance, MD;  Location: [place] CV LAB;  Service: Cardiovascular;  Laterality: N/A;   SPINE SURGERY     TUBAL LIGATION      Social History:  reports that she has never smoked. She has never used smokeless tobacco. She reports that she does not currently use alcohol. She reports that she does not use drugs.  Family History:  Family History  Problem Relation Age of Onset   Cancer Mother        breast   Heart disease Mother    Stroke Mother    Diabetes Mother    Colon cancer Mother    Cancer Father        leukemia   Stroke Father    Leukemia Father    Ovarian cancer Sister    Diabetes Brother    Cancer Brother    Stroke Maternal Grandfather    Dementia Paternal Grandmother    COPD Neg Hx    Hypertension Neg Hx      Prior to Admission medications   Medication Sig Start Date End Date Taking? Authorizing Provider  acetaminophen (TYLENOL) 325 MG tablet Take 2 tablets (650 mg total) by mouth every 4 (four) hours as needed for mild pain (or temp > 37.5 C (99.5 F)). 08/21/21   Angiulli, Lavon Paganini, PA-C  amLODipine (NORVASC) 2.5 MG tablet Take 1 tablet (2.5 mg total) by mouth daily. 08/21/21   Angiulli, Lavon Paganini, PA-C  apixaban (ELIQUIS) 5 MG TABS tablet Take 1 tablet (5 mg total) by mouth 2 (two) times daily. 08/08/22   Fenton, Clint R, PA  celecoxib (CELEBREX) 200 MG capsule Take 200 mg by mouth 2 (two) times daily.    [provider]  dapagliflozin propanediol (FARXIGA) 10 MG TABS tablet Take 10 mg by mouth daily. 12/04/19   Volney American, PA-C  gabapentin  (NEURONTIN) 100 MG capsule Take 100 mg by mouth at bedtime. 2 tab    [provider]  glipiZIDE (GLUCOTROL XL) 5 MG 24 hr tablet Take 1 tablet (5 mg total) by mouth 2 (two) times daily with a meal. 08/21/21   Angiulli, Lavon Paganini, PA-C  magnesium oxide (MAG-OX) 400 MG tablet Take 1 tablet (400 mg total) by mouth at bedtime. 08/21/21   Angiulli, Lavon Paganini, PA-C  memantine (NAMENDA TITRATION PAK) tablet pack 5 mg/day for =1 week; 5 mg twice daily for =1 week; 15 mg/day given in 5 mg and 10 mg separated doses for =1 week; then 10 mg twice daily 08/25/22   Frann Rider, NP  memantine (NAMENDA) 10 MG tablet Take 1 tablet (10 mg total) by mouth 2 (two) times daily. 09/25/22   Frann Rider, NP  metFORMIN (GLUCOPHAGE) 500 MG tablet Take 2 tablets (  1,000 mg total) by mouth 2 (two) times daily. 08/21/21   Angiulli, Lavon Paganini, PA-C  Multiple Vitamins-Minerals (PRESERVISION AREDS 2) CAPS Take 1 capsule by mouth 2 (two) times daily. 04/05/18   [provider]  pantoprazole (PROTONIX) 40 MG tablet Take 1 tablet (40 mg total) by mouth daily. 08/21/21   Angiulli, Lavon Paganini, PA-C  simvastatin (ZOCOR) 40 MG tablet Take 40 mg by mouth daily.    [provider]  Vitamin D, Ergocalciferol, (DRISDOL) 1.25 MG (50000 UNIT) CAPS capsule Take 1 capsule (50,000 Units total) by mouth every 7 (seven) days. 08/24/21   [name] Parsons, PA-C    Physical Exam: Vitals:   09/01/22 1100 09/01/22 1207 09/01/22 1614 09/01/22 1618  BP: (!) 123/59 132/64  (!) 144/70  Pulse: 84   82  Resp: (!) 21   20  Temp: 99.2 F (37.3 C)  98.3 F (36.8 C)   TempSrc: Oral  Oral   SpO2: 96%   93%  Weight:      Height:       General: Not in acute distress HEENT:       Eyes: PERRL, EOMI, no scleral icterus.       ENT: No discharge from the ears and nose, no pharynx injection, no tonsillar enlargement.        Neck: No JVD, no bruit, no mass felt. Heme: No neck lymph node enlargement. Cardiac: S1/S2, RRR, No murmurs, No gallops  or rubs. Respiratory: No rales, wheezing, rhonchi or rubs. GI: Soft, nondistended, nontender, no rebound pain, no organomegaly, BS present. GU: No hematuria Ext: No pitting leg edema bilaterally. 1+DP/PT pulse bilaterally. Musculoskeletal: No joint deformities, No joint redness or warmth, no limitation of ROM in spin. Skin: No rashes.  Neuro: Alert, oriented X3, cranial nerves II-XII grossly intact, moves all extremities normally.  Psych: Patient is not psychotic, no suicidal or hemocidal ideation.  Labs on Admission: I have personally reviewed following labs and imaging studies  CBC: Recent Labs  Lab 09/01/22 0558  WBC 11.1*  NEUTROABS 9.6*  HGB 14.2  HCT 44.0  MCV 91.5  PLT [id number]   Basic Metabolic Panel: Recent Labs  Lab 09/01/22 0558  NA 140  K 4.1  CL 104  CO2 25  GLUCOSE 149*  BUN 26*  CREATININE 1.10*  CALCIUM 9.1  MG 1.7   GFR: Estimated Creatinine Clearance: 45.5 mL/min (A) (by C-G formula based on SCr of 1.1 mg/dL (H)). Liver Function Tests: Recent Labs  Lab 09/01/22 0558  AST 29  ALT 27  ALKPHOS 98  BILITOT 0.7  PROT 7.2  ALBUMIN 4.1   No results for input(s): "LIPASE", "AMYLASE" in the last 168 hours. No results for input(s): "AMMONIA" in the last 168 hours. Coagulation Profile: No results for input(s): "INR", "PROTIME" in the last 168 hours. Cardiac Enzymes: No results for input(s): "CKTOTAL", "CKMB", "CKMBINDEX", "TROPONINI" in the last 168 hours. BNP (last 3 results) No results for input(s): "PROBNP" in the last 8760 hours. HbA1C: No results for input(s): "HGBA1C" in the last 72 hours. CBG: Recent Labs  Lab 09/01/22 0831 09/01/22 1107 09/01/22 1606  GLUCAP 101* 153* 73   Lipid Profile: No results for input(s): "CHOL", "HDL", "LDLCALC", "TRIG", "CHOLHDL", "LDLDIRECT" in the last 72 hours. Thyroid Function Tests: No results for input(s): "TSH", "T4TOTAL", "FREET4", "T3FREE", "THYROIDAB" in the last 72 hours. Anemia Panel: No results  for input(s): "VITAMINB12", "FOLATE", "FERRITIN", "TIBC", "IRON", "RETICCTPCT" in the last 72 hours. Urine analysis:    Component  Value Date/Time   COLORURINE YELLOW (A) 09/01/2022 0558   APPEARANCEUR CLEAR (A) 09/01/2022 0558   APPEARANCEUR Clear 11/23/2019 1115   LABSPEC 1.018 09/01/2022 0558   PHURINE 5.0 09/01/2022 0558   GLUCOSEU >=500 (A) 09/01/2022 0558   HGBUR NEGATIVE 09/01/2022 0558   BILIRUBINUR NEGATIVE 09/01/2022 0558   BILIRUBINUR Negative 11/23/2019 1115   KETONESUR 5 (A) 09/01/2022 0558   PROTEINUR NEGATIVE 09/01/2022 0558   NITRITE NEGATIVE 09/01/2022 0558   LEUKOCYTESUR SMALL (A) 09/01/2022 0558   Sepsis Labs: @LABRCNTIP$ (procalcitonin:4,lacticidven:4) ) Recent Results (from the past 240 hour(s))  Resp panel by RT-PCR (RSV, Flu A&B, Covid) Anterior Nasal Swab     Status: Abnormal   Collection Time: 09/01/22  8:32 AM   Specimen: Anterior Nasal Swab  Result Value Ref Range Status   SARS Coronavirus 2 by RT PCR POSITIVE (A) NEGATIVE Final    Comment: (NOTE) SARS-CoV-2 target nucleic acids are DETECTED.  The SARS-CoV-2 RNA is generally detectable in upper respiratory specimens during the acute phase of infection. Positive results are indicative of the presence of the identified virus, but do not rule out bacterial infection or co-infection with other pathogens not detected by the test. Clinical correlation with patient history and other diagnostic information is necessary to determine patient infection status. The expected result is Negative.  Fact Sheet for Patients: EntrepreneurPulse.com.au  Fact Sheet for Healthcare Providers: IncredibleEmployment.be  This test is not yet approved or cleared by the [place] FDA and  has been authorized for detection and/or diagnosis of SARS-CoV-2 by FDA under an Emergency Use Authorization (EUA).  This EUA will remain in effect (meaning this test can be used) for the duration of   the COVID-19 declaration under Section 564(b)(1) of the A ct, 21 U.S.C. section 360bbb-3(b)(1), unless the authorization is terminated or revoked sooner.     Influenza A by PCR NEGATIVE NEGATIVE Final   Influenza B by PCR NEGATIVE NEGATIVE Final    Comment: (NOTE) The Xpert Xpress SARS-CoV-2/FLU/RSV plus assay is intended as an aid in the diagnosis of influenza from Nasopharyngeal swab specimens and should not be used as a sole basis for treatment. Nasal washings and aspirates are unacceptable for Xpert Xpress SARS-CoV-2/FLU/RSV testing.  Fact Sheet for Patients: EntrepreneurPulse.com.au  Fact Sheet for Healthcare Providers: IncredibleEmployment.be  This test is not yet approved or cleared by the [place] FDA and has been authorized for detection and/or diagnosis of SARS-CoV-2 by FDA under an Emergency Use Authorization (EUA). This EUA will remain in effect (meaning this test can be used) for the duration of the COVID-19 declaration under Section 564(b)(1) of the Act, 21 U.S.C. section 360bbb-3(b)(1), unless the authorization is terminated or revoked.     Resp Syncytial Virus by PCR NEGATIVE NEGATIVE Final    Comment: (NOTE) Fact Sheet for Patients: EntrepreneurPulse.com.au  Fact Sheet for Healthcare Providers: IncredibleEmployment.be  This test is not yet approved or cleared by the [place] FDA and has been authorized for detection and/or diagnosis of SARS-CoV-2 by FDA under an Emergency Use Authorization (EUA). This EUA will remain in effect (meaning this test can be used) for the duration of the COVID-19 declaration under Section 564(b)(1) of the Act, 21 U.S.C. section 360bbb-3(b)(1), unless the authorization is terminated or revoked.  Performed at [facility], [address], [place], [place] [address]      Radiological Exams on Admission: DG Chest [facility] 1 View  Result  Date: 09/01/2022 CLINICAL DATA:  Positive COVID-19 home test.  [id number].  Weakness.  EXAM: PORTABLE CHEST 1 VIEW COMPARISON:  AP and lateral 08/15/2021. FINDINGS: 5:43 a.m. Stable mild-to-moderate cardiomegaly. No vascular congestion is seen. A loop recorder device again superimposes in the left chest wall. There is calcification in the transverse aorta. The mediastinum is normally outlined. The lungs are clear. The sulci are sharp. Thoracic spondylosis. IMPRESSION: 1. No evidence of acute chest disease. Stable cardiomegaly. 2. Aortic atherosclerosis. 3. Loop recorder device. Electronically Signed   By: Telford Nab M.D.   On: 09/01/2022 06:20      Assessment/Plan Principal Problem:   UTI (urinary tract infection) Active Problems:   COVID-19 virus infection   Paroxysmal atrial fibrillation (HCC)   DM (diabetes mellitus), type 2 with neurological complications (HCC)   Hyperlipidemia   Essential hypertension   CVA (cerebral vascular accident) ([place])   Chronic kidney disease, stage 3a (HCC)   Chronic diastolic CHF (congestive heart failure) (HCC)   OSA (obstructive sleep apnea)   Obesity (BMI 30-39.9)   Assessment and Plan:  UTI (urinary tract infection): -place in tele bed for obs -IV recephin -f/u Ux  COVID-19 virus infection: No oxygen desaturation, no infiltration on chest x-ray.  Patient is on Eliquis, cannot use Paxlovid. -As needed albuterol and Mucinex  Paroxysmal atrial fibrillation [facility]): Heart rate 110-120 -Continue Eliquis -As needed metoprolol 2.5 mg every 2 hours for heart rate> 125  DM (diabetes mellitus), type 2 with neurological complications [facility]): Recent A1c 7.4, poorly controlled.  Patient still taking metformin, Farxiga, glipizide -SSI  Hyperlipidemia -Crestor  Essential hypertension -IV hydralazine as needed -Amlodipine  CVA (cerebral vascular accident) ([place]) -Eliquis and Crestor  Chronic kidney disease, stage 3a ([place]): Renal function at  baseline. -Follow-up with BMP  Chronic diastolic CHF (congestive heart failure) ([place]): 2D echo on 08/02/2021 showed EF of 55-60% with grade 1 diastolic dysfunction.  No leg edema or JVD.  CHF is compensated.  BNP 153 -Watch volume status closely  OSA (obstructive sleep apnea) -CPAP  Obesity (BMI 30-39.9): Body weight 86.2 kg, BMI 32.61 -Exercise and healthy diet -Encourage losing weight    DVT ppx: on Eliquis  Code Status: Full code per pt and her son  Family Communication: Yes, patient's son by phone  Disposition Plan:  Anticipate discharge back to previous environment  Consults called:  none  Admission status and Level of care: Telemetry Medical:   as inpt       Dispo: The patient is from: Home              Anticipated d/c is to: Home              Anticipated d/c date is: 1 day              Patient currently is not medically stable to d/c.    Severity of Illness:  The appropriate patient status for this patient is INPATIENT. Inpatient status is judged to be reasonable and necessary in order to provide the required intensity of service to ensure the patient's safety. The patient's presenting symptoms, physical exam findings, and initial radiographic and laboratory data in the context of their chronic comorbidities is felt to place them at high risk for further clinical deterioration. Furthermore, it is not anticipated that the patient will be medically stable for discharge from the hospital within 2 midnights of admission.   * I certify that at the point of admission it is my clinical judgment that the patient will require inpatient hospital care spanning beyond 2 midnights from the point of  admission due to high intensity of service, high risk for further deterioration and high frequency of surveillance required.*       Date of Service 09/01/2022    Ivor Costa [facility]   If 7PM-7AM, please contact night-coverage [email or web address] 09/01/2022, 5:29 PM

## 2022-09-02 ENCOUNTER — Ambulatory Visit: Payer: Medicare HMO

## 2022-09-02 ENCOUNTER — Encounter: Payer: Medicare HMO | Admitting: Speech Pathology

## 2022-09-02 ENCOUNTER — Ambulatory Visit: Payer: Medicare HMO | Admitting: Occupational Therapy

## 2022-09-02 DIAGNOSIS — E1149 Type 2 diabetes mellitus with other diabetic neurological complication: Secondary | ICD-10-CM

## 2022-09-02 DIAGNOSIS — I639 Cerebral infarction, unspecified: Secondary | ICD-10-CM | POA: Diagnosis not present

## 2022-09-02 DIAGNOSIS — N3000 Acute cystitis without hematuria: Secondary | ICD-10-CM | POA: Diagnosis not present

## 2022-09-02 DIAGNOSIS — E785 Hyperlipidemia, unspecified: Secondary | ICD-10-CM | POA: Diagnosis not present

## 2022-09-02 DIAGNOSIS — E669 Obesity, unspecified: Secondary | ICD-10-CM

## 2022-09-02 DIAGNOSIS — U071 COVID-19: Secondary | ICD-10-CM | POA: Diagnosis not present

## 2022-09-02 LAB — BASIC METABOLIC PANEL
Anion gap: 9 (ref 5–15)
BUN: 20 mg/dL (ref 8–23)
CO2: 25 mmol/L (ref 22–32)
Calcium: 8.7 mg/dL — ABNORMAL LOW (ref 8.9–10.3)
Chloride: 106 mmol/L (ref 98–111)
Creatinine, Ser: 1.1 mg/dL — ABNORMAL HIGH (ref 0.44–1.00)
GFR, Estimated: 52 mL/min — ABNORMAL LOW (ref >60)
Glucose, Bld: 107 mg/dL — ABNORMAL HIGH (ref 70–99)
Potassium: 3.8 mmol/L (ref 3.5–5.1)
Sodium: 140 mmol/L (ref 135–145)

## 2022-09-02 LAB — CBC
HCT: 38.4 % (ref 36.0–46.0)
Hemoglobin: 12.3 g/dL (ref 12.0–15.0)
MCH: 29.2 pg (ref 26.0–34.0)
MCHC: 32 g/dL (ref 30.0–36.0)
MCV: 91.2 fL (ref 80.0–100.0)
Platelets: 174 10*3/uL (ref 150–400)
RBC: 4.21 MIL/uL (ref 3.87–5.11)
RDW: 14 % (ref 11.5–15.5)
WBC: 7 10*3/uL (ref 4.0–10.5)
nRBC: 0 % (ref 0.0–0.2)

## 2022-09-02 LAB — GLUCOSE, CAPILLARY
Glucose-Capillary: 117 mg/dL — ABNORMAL HIGH (ref 70–99)
Glucose-Capillary: 152 mg/dL — ABNORMAL HIGH (ref 70–99)
Glucose-Capillary: 182 mg/dL — ABNORMAL HIGH (ref 70–99)
Glucose-Capillary: 165 mg/dL — ABNORMAL HIGH (ref 70–99)

## 2022-09-02 MED ORDER — MAGNESIUM SULFATE 2 GM/50ML IV SOLN
2.0000 g | Freq: Once | INTRAVENOUS | Status: AC
  Administered 2022-09-02: 2 g via INTRAVENOUS
  Filled 2022-09-02: qty 50

## 2022-09-02 MED ORDER — MENTHOL 3 MG MT LOZG
1.0000 | LOZENGE | OROMUCOSAL | Status: DC | PRN
  Filled 2022-09-02: qty 9

## 2022-09-02 MED ORDER — GABAPENTIN 100 MG PO CAPS
200.0000 mg | ORAL_CAPSULE | Freq: Every day | ORAL | Status: DC
  Administered 2022-09-02: 200 mg via ORAL
  Filled 2022-09-02: qty 2

## 2022-09-02 MED ORDER — OCUVITE-LUTEIN PO CAPS
1.0000 | ORAL_CAPSULE | Freq: Two times a day (BID) | ORAL | Status: DC
  Filled 2022-09-02: qty 1

## 2022-09-02 MED ORDER — PROSIGHT PO TABS
1.0000 | ORAL_TABLET | Freq: Every day | ORAL | Status: DC
  Administered 2022-09-02 – 2022-09-03 (×2): 1 via ORAL
  Filled 2022-09-02 (×2): qty 1

## 2022-09-02 NOTE — Progress Notes (Signed)
PROGRESS NOTE    [place]   [id number] DOB: 11-09-1945  DOA: 09/01/2022 Date of Service: 09/02/22 PCP: [place] Pink, MD     Brief Narrative / Hospital Course:  Alyssa Mayo is a 77 y.o. female with medical history significant of A fib on Eliquis, HTN, HLD, DM, dCHF, stroke, CKD-3a, dementia, who presents to ED from home 09/01/22 with cough, increased urinary frequency, weakness. (+)COVID home test 02/11 02/12: in ED, (+)COVID PCR, (+)UA, Temp 99.2, HR 100-120, RR 24, CXR neg, renal fxn close to baseline.  02/13: UCx (+)Ecoli, susceptibilities pending.   Consultants:  none  Procedures: none      ASSESSMENT & PLAN:   Principal Problem:   UTI (urinary tract infection) Active Problems:   COVID-19 virus infection   Paroxysmal atrial fibrillation (HCC)   DM (diabetes mellitus), type 2 with neurological complications (HCC)   Hyperlipidemia   Essential hypertension   CVA (cerebral vascular accident) ([place])   Chronic kidney disease, stage 3a (HCC)   Chronic diastolic CHF (congestive heart failure) (HCC)   OSA (obstructive sleep apnea)   Obesity (BMI 30-39.9)   UTI (urinary tract infection) Meeting sepsis criteria on admission w/ tachycardia, tachypnea though elevated HR can also be d/t Afib  IV recephin f/u Ux   COVID-19 virus infection:  No oxygen desaturation, no infiltration on chest x-ray.   Patient is on Eliquis, cannot use Paxlovid. As needed albuterol and Mucinex   Paroxysmal atrial fibrillation [facility]):  Heart rate 110-120 on admission, improved  Continue Eliquis As needed metoprolol 2.5 mg every 2 hours for heart rate> 125   DM (diabetes mellitus), type 2 with neurological complications [facility]):  Recent A1c 7.4, decently controlled.   Patient at home is taking metformin, Farxiga, glipizide SSI   Hyperlipidemia Crestor   Essential hypertension IV hydralazine as needed Amlodipine   CVA (cerebral vascular accident) ([place]) Eliquis and  Crestor   Chronic kidney disease, stage 3a ([place]):  Renal function at baseline. Follow-up with BMP   Chronic diastolic CHF (congestive heart failure) ([place]):  2D echo on 08/02/2021 showed EF of 55-60% with grade 1 diastolic dysfunction.   No leg edema or JVD.  CHF is compensated.  BNP 153 Watch volume status closely   OSA (obstructive sleep apnea) CPAP   Obesity (BMI 30-39.9): Body weight 86.2 kg, BMI 32.61 Exercise and healthy diet Encourage losing weight    DVT prophylaxis: Eliquis  Pertinent IV fluids/nutrition: no continuous IV fluids at this time  Central lines / invasive devices: none  Code Status: FULL CODE  Current Admission Status: observation (may need inpatient?)  TOC needs / Dispo plan: none at this time Barriers to discharge / significant pending items: urine culture              Subjective / Brief ROS:  Patient reports no concerns Examine lying in bed - sleepy but rousable to voice Denies CP/SOB.  Pain controlled.    Family Communication: none at this time     Objective Findings:  Vitals:   09/02/22 0500 09/02/22 0624 09/02/22 0737 09/02/22 1153  BP:  139/65 (!) 154/59 130/60  Pulse:  (!) 102 98 99  Resp:  16 18 17  $ Temp:  99.9 F (37.7 C) 99.2 F (37.3 C) 99.4 F (37.4 C)  TempSrc:  Oral    SpO2:  90% 90% 95%  Weight: 89.8 kg     Height:       No intake or output data in the 24 hours  ending 09/02/22 1206 Filed Weights   09/01/22 0514 09/02/22 0500  Weight: 86.2 kg 89.8 kg    Examination:  Physical Exam Constitutional:      General: She is not in acute distress.    Appearance: Normal appearance. She is obese. She is ill-appearing.  Cardiovascular:     Rate and Rhythm: Normal rate and regular rhythm.  Pulmonary:     Effort: Pulmonary effort is normal. No respiratory distress.     Breath sounds: Normal breath sounds.  Abdominal:     Palpations: Abdomen is soft.  Musculoskeletal:     Right lower leg: No edema.     Left  lower leg: No edema.  Skin:    General: Skin is warm and dry.  Neurological:     General: No focal deficit present.          Scheduled Medications:   amLODipine  2.5 mg Oral Daily   apixaban  5 mg Oral BID   atorvastatin  20 mg Oral Daily   celecoxib  200 mg Oral BID   gabapentin  200 mg Oral QHS   insulin aspart  0-5 Units Subcutaneous QHS   insulin aspart  0-9 Units Subcutaneous TID WC   magnesium oxide  400 mg Oral QHS   memantine  10 mg Oral BID   multivitamin  1 tablet Oral Daily   pantoprazole  40 mg Oral Daily    Continuous Infusions:  cefTRIAXone (ROCEPHIN)  IV 2 g (09/02/22 0828)   magnesium sulfate bolus IVPB 2 g (09/02/22 1205)    PRN Medications:  acetaminophen, albuterol, dextromethorphan-guaiFENesin, hydrALAZINE, menthol-cetylpyridinium, metoprolol tartrate, ondansetron (ZOFRAN) IV  Antimicrobials from admission:  Anti-infectives (From admission, onward)    Start     Dose/Rate Route Frequency Ordered Stop   09/02/22 0830  cefTRIAXone (ROCEPHIN) 2 g in sodium chloride 0.9 % 100 mL IVPB        2 g 200 mL/hr over 30 Minutes Intravenous Every 24 hours 09/01/22 1122     09/01/22 0730  cefTRIAXone (ROCEPHIN) 2 g in sodium chloride 0.9 % 100 mL IVPB        2 g 200 mL/hr over 30 Minutes Intravenous  Once 09/01/22 0715 09/01/22 0944           Data Reviewed:  I have personally reviewed the following...  CBC: Recent Labs  Lab 09/01/22 0558 09/02/22 0414  WBC 11.1* 7.0  NEUTROABS 9.6*  --   HGB 14.2 12.3  HCT 44.0 38.4  MCV 91.5 91.2  PLT 217 [id number]   Basic Metabolic Panel: Recent Labs  Lab 09/01/22 0558 09/02/22 0414  NA 140 140  K 4.1 3.8  CL 104 106  CO2 25 25  GLUCOSE 149* 107*  BUN 26* 20  CREATININE 1.10* 1.10*  CALCIUM 9.1 8.7*  MG 1.7  --    GFR: Estimated Creatinine Clearance: 46.5 mL/min (A) (by C-G formula based on SCr of 1.1 mg/dL (H)). Liver Function Tests: Recent Labs  Lab 09/01/22 0558  AST 29  ALT 27  ALKPHOS 98   BILITOT 0.7  PROT 7.2  ALBUMIN 4.1   No results for input(s): "LIPASE", "AMYLASE" in the last 168 hours. No results for input(s): "AMMONIA" in the last 168 hours. Coagulation Profile: No results for input(s): "INR", "PROTIME" in the last 168 hours. Cardiac Enzymes: No results for input(s): "CKTOTAL", "CKMB", "CKMBINDEX", "TROPONINI" in the last 168 hours. BNP (last 3 results) No results for input(s): "PROBNP" in the last 8760 hours. HbA1C:  No results for input(s): "HGBA1C" in the last 72 hours. CBG: Recent Labs  Lab 09/01/22 1107 09/01/22 1606 09/01/22 2045 09/02/22 0738 09/02/22 1154  GLUCAP 153* 73 110* 117* 152*   Lipid Profile: No results for input(s): "CHOL", "HDL", "LDLCALC", "TRIG", "CHOLHDL", "LDLDIRECT" in the last 72 hours. Thyroid Function Tests: No results for input(s): "TSH", "T4TOTAL", "FREET4", "T3FREE", "THYROIDAB" in the last 72 hours. Anemia Panel: No results for input(s): "VITAMINB12", "FOLATE", "FERRITIN", "TIBC", "IRON", "RETICCTPCT" in the last 72 hours. Most Recent Urinalysis On File:     Component Value Date/Time   COLORURINE YELLOW (A) 09/01/2022 0558   APPEARANCEUR CLEAR (A) 09/01/2022 0558   APPEARANCEUR Clear 11/23/2019 1115   LABSPEC 1.018 09/01/2022 0558   PHURINE 5.0 09/01/2022 0558   GLUCOSEU >=500 (A) 09/01/2022 0558   HGBUR NEGATIVE 09/01/2022 0558   BILIRUBINUR NEGATIVE 09/01/2022 0558   BILIRUBINUR Negative 11/23/2019 1115   KETONESUR 5 (A) 09/01/2022 0558   PROTEINUR NEGATIVE 09/01/2022 0558   NITRITE NEGATIVE 09/01/2022 0558   LEUKOCYTESUR SMALL (A) 09/01/2022 0558   Sepsis Labs: @LABRCNTIP$ (procalcitonin:4,lacticidven:4) Microbiology: Recent Results (from the past 240 hour(s))  Urine Culture (for pregnant, neutropenic or urologic patients or patients with an indwelling urinary catheter)     Status: Abnormal (Preliminary result)   Collection Time: 09/01/22  6:51 AM   Specimen: Urine, Clean Catch  Result Value Ref Range  Status   Specimen Description   Final    URINE, CLEAN CATCH Performed at [facility], [address], [place], [place] [address]    Special Requests   Final    NONE Performed at [facility], [address] [place], [place] [address]    Culture (A)  Final    40,000 COLONIES/mL ESCHERICHIA COLI SUSCEPTIBILITIES TO FOLLOW Performed at [facility], [place] [address], [place],  [address]    Report Status PENDING  Incomplete  Resp panel by RT-PCR (RSV, Flu A&B, Covid) Anterior Nasal Swab     Status: Abnormal   Collection Time: 09/01/22  8:32 AM   Specimen: Anterior Nasal Swab  Result Value Ref Range Status   SARS Coronavirus 2 by RT PCR POSITIVE (A) NEGATIVE Final    Comment: (NOTE) SARS-CoV-2 target nucleic acids are DETECTED.  The SARS-CoV-2 RNA is generally detectable in upper respiratory specimens during the acute phase of infection. Positive results are indicative of the presence of the identified virus, but do not rule out bacterial infection or co-infection with other pathogens not detected by the test. Clinical correlation with patient history and other diagnostic information is necessary to determine patient infection status. The expected result is Negative.  Fact Sheet for Patients: EntrepreneurPulse.com.au  Fact Sheet for Healthcare Providers: IncredibleEmployment.be  This test is not yet approved or cleared by the [place] FDA and  has been authorized for detection and/or diagnosis of SARS-CoV-2 by FDA under an Emergency Use Authorization (EUA).  This EUA will remain in effect (meaning this test can be used) for the duration of  the COVID-19 declaration under Section 564(b)(1) of the A ct, 21 U.S.C. section 360bbb-3(b)(1), unless the authorization is terminated or revoked sooner.     Influenza A by PCR NEGATIVE NEGATIVE Final   Influenza B by PCR NEGATIVE NEGATIVE Final    Comment:  (NOTE) The Xpert Xpress SARS-CoV-2/FLU/RSV plus assay is intended as an aid in the diagnosis of influenza from Nasopharyngeal swab specimens and should not be used as a sole basis for treatment. Nasal washings and aspirates are  unacceptable for Xpert Xpress SARS-CoV-2/FLU/RSV testing.  Fact Sheet for Patients: EntrepreneurPulse.com.au  Fact Sheet for Healthcare Providers: IncredibleEmployment.be  This test is not yet approved or cleared by the [place] FDA and has been authorized for detection and/or diagnosis of SARS-CoV-2 by FDA under an Emergency Use Authorization (EUA). This EUA will remain in effect (meaning this test can be used) for the duration of the COVID-19 declaration under Section 564(b)(1) of the Act, 21 U.S.C. section 360bbb-3(b)(1), unless the authorization is terminated or revoked.     Resp Syncytial Virus by PCR NEGATIVE NEGATIVE Final    Comment: (NOTE) Fact Sheet for Patients: EntrepreneurPulse.com.au  Fact Sheet for Healthcare Providers: IncredibleEmployment.be  This test is not yet approved or cleared by the [place] FDA and has been authorized for detection and/or diagnosis of SARS-CoV-2 by FDA under an Emergency Use Authorization (EUA). This EUA will remain in effect (meaning this test can be used) for the duration of the COVID-19 declaration under Section 564(b)(1) of the Act, 21 U.S.C. section 360bbb-3(b)(1), unless the authorization is terminated or revoked.  Performed at [facility], [address], [place], [place] [address]       Radiology Studies last 3 days: [facility] Chest [facility] 1 View  Result Date: 09/01/2022 CLINICAL DATA:  Positive COVID-19 home test.  [id number].  Weakness. EXAM: PORTABLE CHEST 1 VIEW COMPARISON:  AP and lateral 08/15/2021. FINDINGS: 5:43 a.m. Stable mild-to-moderate cardiomegaly. No vascular congestion is seen. A loop recorder device  again superimposes in the left chest wall. There is calcification in the transverse aorta. The mediastinum is normally outlined. The lungs are clear. The sulci are sharp. Thoracic spondylosis. IMPRESSION: 1. No evidence of acute chest disease. Stable cardiomegaly. 2. Aortic atherosclerosis. 3. Loop recorder device. Electronically Signed   By: Telford Nab M.D.   On: 09/01/2022 06:20             LOS: 0 days      Emeterio Reeve, DO [facility] 09/02/2022, 12:06 PM    Dictation software may have been used to generate the above note. Typos may occur and escape review in typed/dictated notes. Please contact Dr Sheppard Coil directly for clarity if needed.  Staff may message me via secure chat in [place]  but this may not receive an immediate response,  please page me for urgent matters!  If 7PM-7AM, please contact night coverage [email or web address]

## 2022-09-02 NOTE — Consult Note (Signed)
Followed up per day time chaplain, pt still unavailable due to COVID restrictions. Staff aware pt requesting AD paperwork.

## 2022-09-02 NOTE — Care Management Obs Status (Signed)
[facility] NOTIFICATION   Patient Details  Name: Alyssa Mayo MRN: [id number] Date of Birth: 1945/08/28   Medicare Observation Status Notification Given:  Yes    Dione Mccombie, LCSW 09/02/2022, 10:30 AM

## 2022-09-02 NOTE — Progress Notes (Signed)
  Chaplain On-Call responded to Spiritual Care Consult Order from Ivor Costa, MD.  The Order was for Advance Directives information for the patient.  Chaplain learned from Unit Secretary that the patient is COVID-positive and is unavailable.  Chaplain Pollyann Samples M.Div., [facility]

## 2022-09-02 NOTE — Evaluation (Signed)
Physical Therapy Evaluation Patient Details Name: Asialyn Elefante MRN: [id number] DOB: 1946-04-30 Today's Date: 09/02/2022  History of Present Illness  Pt is a 77 y.o. female presenting to hospital 09/01/22 with c/o weakness, cough, and increased urinary frequency; recent positive COVID-19 test at home.  H/o CVA 07/2021 with residual L shoulder pain (currently working with OP PT/OT).  Pt admitted with UTI, COVID-19 virus infection, and paroxysmal a-fib.  PMH includes DM, htn, macular degeneration, sleep apnea, cervical laminectomy.  Clinical Impression  Prior to hospital admission, pt was independent with ambulation; attending OP PT/OT; lives with her son on main level of home with 4 STE B railings.  1/10 headache during session.  Currently pt is modified independent semi-supine to sitting edge of bed; CGA with transfers; and CGA ambulating 70 feet (no AD use).  Pt mildly unsteady at times during ambulation but pt able to self correct; limited ambulation distance d/t pt fatigue/generalized weakness.  Pt would benefit from skilled PT to address noted impairments and functional limitations (see below for any additional details).  Upon hospital discharge, pt would benefit from continued OP PT.    Recommendations for follow up therapy are one component of a multi-disciplinary discharge planning process, led by the attending physician.  Recommendations may be updated based on patient status, additional functional criteria and insurance authorization.  Follow Up Recommendations Outpatient PT      Assistance Recommended at Discharge Intermittent Supervision/Assistance  Patient can return home with the following  A little help with walking and/or transfers;A little help with bathing/dressing/bathroom;Assistance with cooking/housework;Assist for transportation;Help with stairs or ramp for entrance    Equipment Recommendations None recommended by PT  Recommendations for Other Services       Functional  Status Assessment Patient has had a recent decline in their functional status and demonstrates the ability to make significant improvements in function in a reasonable and predictable amount of time.     Precautions / Restrictions Precautions Precautions: Fall Restrictions Weight Bearing Restrictions: No      Mobility  Bed Mobility Overal bed mobility: Modified Independent             General bed mobility comments: Mild increased effort/time to perform on own; HOB elevated; use of bed rail    Transfers Overall transfer level: Needs assistance Equipment used: None Transfers: Sit to/from Stand, Bed to chair/wheelchair/BSC Sit to Stand: Min guard   Step pivot transfers: Min guard (stand step turn bed to recliner)       General transfer comment: mild increased effort to stand (x1 trial from bed and x1 trial from recliner)    Ambulation/Gait Ambulation/Gait assistance: Min guard Gait Distance (Feet): 70 Feet Assistive device: None Gait Pattern/deviations: Step-through pattern Gait velocity: decreased     General Gait Details: mildly unsteady at times but pt able to self correct; limited d/t pt fatigue  Stairs            Wheelchair Mobility    Modified Rankin (Stroke Patients Only)       Balance Overall balance assessment: Needs assistance Sitting-balance support: No upper extremity supported, Feet supported Sitting balance-Leahy Scale: Normal Sitting balance - Comments: steady sitting reaching outside BOS   Standing balance support: No upper extremity supported, During functional activity Standing balance-Leahy Scale: Fair Standing balance comment: pt unsteady at times but able to self correct during ambulation  Pertinent Vitals/Pain Pain Assessment Pain Assessment: 0-10 Pain Score: 1  Pain Location: headache Pain Descriptors / Indicators: Headache Pain Intervention(s): Limited activity within patient's  tolerance, Monitored during session, Repositioned Vitals (HR and O2 on room air) stable and WFL throughout treatment session.    Home Living Family/patient expects to be discharged to:: Private residence Living Arrangements: Children (pt's son) Available Help at Discharge: Family (pt's son available 24/7 usually (currently away on trip chaperoning grandson's fieldtrip)) Type of Home: House Home Access: Stairs to enter Entrance Stairs-Rails: Right;Left;Can reach both Entrance Stairs-Number of Steps: 4   Home Layout: One level Home Equipment: Cane - single point;Shower seat;Other (comment);Wheelchair - manual (non-skid mat in shower; bed rail)      Prior Function Prior Level of Function : Needs assist  Cognitive Assist : ADLs (cognitive)     Physical Assist : ADLs (physical)   ADLs (physical): IADLs (son assists with meal preparation and driving) Mobility Comments: Pt does not use AD at baseline; is working with OP PT. ADLs Comments: Per OT eval "Pt is working with OPOT on [facility] and L shoulder pain/ROM. (I) ADLs; states she is unable to get down to her feet, so she uses slip on shoes."     Hand Dominance        Extremity/Trunk Assessment   Upper Extremity Assessment Upper Extremity Assessment: Defer to OT evaluation    Lower Extremity Assessment Lower Extremity Assessment: Generalized weakness    Cervical / Trunk Assessment Cervical / Trunk Assessment: Normal  Communication   Communication: No difficulties  Cognition Arousal/Alertness: Awake/alert (Pt sleeping upon arrival but woken with vc's) Behavior During Therapy: [facility] for tasks assessed/performed Overall Cognitive Status: Within Functional Limits for tasks assessed                                          General Comments  Pt agreeable to PT session.    Exercises     Assessment/Plan    PT Assessment Patient needs continued PT services  PT Problem List Decreased strength;Decreased activity  tolerance;Decreased balance;Decreased mobility;Decreased knowledge of precautions       PT Treatment Interventions DME instruction;Gait training;Stair training;Functional mobility training;Therapeutic activities;Therapeutic exercise;Balance training;Patient/family education    PT Goals (Current goals can be found in the Care Plan section)  Acute Rehab PT Goals Patient Stated Goal: to improve strength and feel better PT Goal Formulation: With patient Time For Goal Achievement: 09/16/22 Potential to Achieve Goals: Good    Frequency Min 2X/week     Co-evaluation               AM-PAC PT "6 Clicks" Mobility  Outcome Measure Help needed turning from your back to your side while in a flat bed without using bedrails?: None Help needed moving from lying on your back to sitting on the side of a flat bed without using bedrails?: None Help needed moving to and from a bed to a chair (including a wheelchair)?: A Little Help needed standing up from a chair using your arms (e.g., wheelchair or bedside chair)?: A Little Help needed to walk in hospital room?: A Little Help needed climbing 3-5 steps with a railing? : A Little 6 Click Score: 20    End of Session Equipment Utilized During Treatment: Gait belt Activity Tolerance: Patient limited by fatigue Patient left: in chair;with call bell/phone within reach;with chair alarm set Nurse Communication: Mobility  status;Precautions PT Visit Diagnosis: Unsteadiness on feet (R26.81);Muscle weakness (generalized) (M62.81)    Time: 1105-1140 PT Time Calculation (min) (ACUTE ONLY): 35 min   Charges:   PT Evaluation $PT Eval Low Complexity: 1 Low PT Treatments $Therapeutic Activity: 8-22 mins       Leitha Bleak, PT 09/02/22, 12:25 PM

## 2022-09-02 NOTE — Hospital Course (Addendum)
Alyssa Mayo is a 77 y.o. female with medical history significant of A fib on Eliquis, HTN, HLD, DM, dCHF, stroke, CKD-3a, dementia, who presents to ED from home 09/01/22 with cough, increased urinary frequency, weakness. (+)COVID home test 02/11 02/12: in ED, (+)COVID PCR, (+)UA, Temp 99.2, HR 100-120, RR 24, CXR neg, renal fxn close to baseline.  02/13: UCx (+)Ecoli, susceptibilities pending.   Consultants:  none  Procedures: none      ASSESSMENT & PLAN:   Principal Problem:   UTI (urinary tract infection) Active Problems:   COVID-19 virus infection   Paroxysmal atrial fibrillation (HCC)   DM (diabetes mellitus), type 2 with neurological complications (HCC)   Hyperlipidemia   Essential hypertension   CVA (cerebral vascular accident) ([place])   Chronic kidney disease, stage 3a (HCC)   Chronic diastolic CHF (congestive heart failure) (HCC)   OSA (obstructive sleep apnea)   Obesity (BMI 30-39.9)   UTI (urinary tract infection) Meeting sepsis criteria on admission w/ tachycardia, tachypnea though elevated HR can also be d/t Afib  IV recephin f/u Ux   COVID-19 virus infection:  No oxygen desaturation, no infiltration on chest x-ray.   Patient is on Eliquis, cannot use Paxlovid. As needed albuterol and Mucinex   Paroxysmal atrial fibrillation [facility]):  Heart rate 110-120 on admission, improved  Continue Eliquis As needed metoprolol 2.5 mg every 2 hours for heart rate> 125   DM (diabetes mellitus), type 2 with neurological complications [facility]):  Recent A1c 7.4, decently controlled.   Patient at home is taking metformin, Farxiga, glipizide SSI   Hyperlipidemia Crestor   Essential hypertension IV hydralazine as needed Amlodipine   CVA (cerebral vascular accident) ([place]) Eliquis and Crestor   Chronic kidney disease, stage 3a ([place]):  Renal function at baseline. Follow-up with BMP   Chronic diastolic CHF (congestive heart failure) ([place]):  2D echo on 08/02/2021  showed EF of 55-60% with grade 1 diastolic dysfunction.   No leg edema or JVD.  CHF is compensated.  BNP 153 Watch volume status closely   OSA (obstructive sleep apnea) CPAP   Obesity (BMI 30-39.9): Body weight 86.2 kg, BMI 32.61 Exercise and healthy diet Encourage losing weight    DVT prophylaxis: Eliquis  Pertinent IV fluids/nutrition: no continuous IV fluids at this time  Central lines / invasive devices: none  Code Status: FULL CODE  Current Admission Status: observation (may need inpatient?)  TOC needs / Dispo plan: none at this time Barriers to discharge / significant pending items: urine culture

## 2022-09-02 NOTE — TOC Initial Note (Signed)
Transition of Care [facility]) - Initial/Assessment Note    Patient Details  Name: Alyssa Mayo MRN: [id number] Date of Birth: [birth year]  Transition of Care [facility]) CM/SW Contact:    Gerilyn Pilgrim, LCSW Phone Number: 09/02/2022, 10:31 AM  Clinical Narrative:  Pt currently lives with son and attends outpatient PT at [facility] outpatient. Pt is insured and is seeing  Dr Minette Headland for primary care. Son currently out of town but reports he provides transport for patient and will transport her home tomorrow. CSW will continue to follow on discharge needs.                      Patient Goals and CMS Choice            Expected Discharge Plan and Services                                              Prior Living Arrangements/Services                       Activities of Daily Living Home Assistive Devices/Equipment: Cane (specify quad or straight) ADL Screening (condition at time of admission) Patient's cognitive ability adequate to safely complete daily activities?: No Is the patient deaf or have difficulty hearing?: Yes Does the patient have difficulty seeing, even when wearing glasses/contacts?: No Does the patient have difficulty concentrating, remembering, or making decisions?: No Patient able to express need for assistance with ADLs?: Yes Does the patient have difficulty dressing or bathing?: No Independently performs ADLs?: No Communication: Independent Dressing (OT): Independent Grooming: Independent Feeding: Independent Bathing: Independent Toileting: Independent In/Out Bed: Independent Walks in Home: Needs assistance Is this a change from baseline?: Change from baseline, expected to last <3 days Does the patient have difficulty walking or climbing stairs?: Yes Weakness of Legs: Left Weakness of Arms/Hands: Left  Permission Sought/Granted                  Emotional Assessment              Admission diagnosis:  UTI (urinary tract  infection) [N39.0] Generalized weakness [R53.1] Patient Active Problem List   Diagnosis Date Noted   UTI (urinary tract infection) 09/01/2022   COVID-19 virus infection 09/01/2022   Chronic diastolic CHF (congestive heart failure) ([place]) 09/01/2022   Chronic kidney disease, stage 3a ([place]) 09/01/2022   Obesity (BMI 30-39.9) 09/01/2022   Paroxysmal atrial fibrillation ([place]) 08/08/2022   Hypercoagulable state due to paroxysmal atrial fibrillation ([place]) 08/08/2022   Cerebral edema ([place]) 08/07/2021   OSA (obstructive sleep apnea) 08/07/2021   Right middle cerebral artery stroke ([place]) 08/07/2021   CVA (cerebral vascular accident) ([place]) 08/01/2021   GERD (gastroesophageal reflux disease) 07/20/2019   Advanced care planning/counseling discussion 12/17/2016   Skin lesions, generalized 11/13/2016   Chronic right hip pain 06/17/2016   Vitamin D deficiency 09/26/2015   Diastasis recti 08/08/2015   Elevated serum GGT level 07/24/2015   Essential hypertension 07/17/2015   Fatty liver 07/17/2015   Elevated alkaline phosphatase level 07/17/2015   Elevated serum glutamic pyruvic transaminase (SGPT) level 07/17/2015   Abnormal finding on EKG 07/11/2015   Morbid obesity ([place]) 07/11/2015   Colon, diverticulosis 07/11/2015   Abdominal wall hernia 07/11/2015   DM (diabetes mellitus), type 2 with neurological complications ([place])    Hyperlipidemia  PCP:  Maryland Pink, MD Pharmacy:   [place], [place] - [address] [address] Phone: [phone] Fax: [phone]     Social Determinants of Health (SDOH) Social History: SDOH Screenings   Food Insecurity: No Food Insecurity (09/01/2022)  Housing: Low Risk  (09/01/2022)  Transportation Needs: No Transportation Needs (09/01/2022)  Utilities: Not At Risk (09/01/2022)  Depression (PHQ2-9): Medium Risk (09/16/2021)  Financial Resource Strain: Low Risk  (12/10/2017)  Physical Activity: Inactive (12/10/2017)   Social Connections: Socially Integrated (12/10/2017)  Stress: No Stress Concern Present (12/10/2017)  Tobacco Use: Low Risk  (08/26/2022)   SDOH Interventions:     Readmission Risk Interventions     No data to display

## 2022-09-03 DIAGNOSIS — N3000 Acute cystitis without hematuria: Secondary | ICD-10-CM | POA: Diagnosis not present

## 2022-09-03 LAB — BASIC METABOLIC PANEL
Anion gap: 8 (ref 5–15)
BUN: 25 mg/dL — ABNORMAL HIGH (ref 8–23)
CO2: 24 mmol/L (ref 22–32)
Calcium: 8.4 mg/dL — ABNORMAL LOW (ref 8.9–10.3)
Chloride: 107 mmol/L (ref 98–111)
Creatinine, Ser: 1.17 mg/dL — ABNORMAL HIGH (ref 0.44–1.00)
GFR, Estimated: 48 mL/min — ABNORMAL LOW (ref >60)
Glucose, Bld: 116 mg/dL — ABNORMAL HIGH (ref 70–99)
Potassium: 3.7 mmol/L (ref 3.5–5.1)
Sodium: 139 mmol/L (ref 135–145)

## 2022-09-03 LAB — GLUCOSE, CAPILLARY
Glucose-Capillary: 95 mg/dL (ref 70–99)
Glucose-Capillary: 121 mg/dL — ABNORMAL HIGH (ref 70–99)
Glucose-Capillary: 184 mg/dL — ABNORMAL HIGH (ref 70–99)

## 2022-09-03 LAB — URINE CULTURE: Culture: 40000 — AB

## 2022-09-03 MED ORDER — CEPHALEXIN 500 MG PO CAPS: 500.0000 mg | ORAL_CAPSULE | Freq: Two times a day (BID) | ORAL | 0 refills | Status: AC

## 2022-09-03 MED ORDER — CEPHALEXIN 500 MG PO CAPS: 500.0000 mg | ORAL_CAPSULE | Freq: Four times a day (QID) | ORAL | 0 refills | Status: DC

## 2022-09-03 NOTE — Progress Notes (Signed)
Patient discharged home with son. All belongings sent home with patient. Discharge education provided to patient and son. Peripheral Ivs removed and all questions answered.

## 2022-09-03 NOTE — TOC Transition Note (Signed)
Transition of Care [facility]) - CM/SW Discharge Note   Patient Details  Name: Alyssa Mayo MRN: [id number] Date of Birth: 14-Feb-1946  Transition [facility] Care [facility]) CM/SW Contact:  Gerilyn Pilgrim, LCSW Phone Number: 09/03/2022, 11:00 AM   Clinical Narrative:   Pt has orders to discharge. Pt established with [facility] outpatient therapy. No needs at this time. CSW singing off.     Final next level of care: OP Rehab Barriers to Discharge: Barriers Resolved   Patient Goals and CMS Choice CMS Medicare.gov Compare Post Acute Care list provided to:: Patient    Discharge Placement                         Discharge Plan and Services Additional resources added to the After Visit Summary for                                       Social Determinants of Health (SDOH) Interventions SDOH Screenings   Food Insecurity: No Food Insecurity (09/01/2022)  Housing: Low Risk  (09/01/2022)  Transportation Needs: No Transportation Needs (09/01/2022)  Utilities: Not At Risk (09/01/2022)  Depression (PHQ2-9): Medium Risk (09/16/2021)  Financial Resource Strain: Low Risk  (12/10/2017)  Physical Activity: Inactive (12/10/2017)  Social Connections: Socially Integrated (12/10/2017)  Stress: No Stress Concern Present (12/10/2017)  Tobacco Use: Low Risk  (08/26/2022)     Readmission Risk Interventions     No data to display

## 2022-09-03 NOTE — Discharge Summary (Addendum)
Physician Discharge Summary  Alyssa Mayo [id number] DOB: 19-Aug-1945 DOA: 09/01/2022  PCP: Maryland Pink, MD  Admit date: 09/01/2022  Discharge date: 09/03/2022  Admitted From: Home  Disposition:  Home.  Recommendations for Outpatient Follow-up:  Follow up with PCP in 1-2 weeks. Please obtain BMP/CBC in one week. Advised to take Keflex 500 mg twice daily for 5 days for UTI. Advised to drink more fluids.  Home Health:None Equipment/Devices:None  Discharge Condition: Stable CODE STATUS:Full code Diet recommendation: Heart Healthy  Brief [name]: Alyssa Mayo is a 77 y.o. female with medical history significant of A fib on Eliquis, HTN, HLD, DM, dCHF, stroke, CKD-3a, dementia, who presents to ED from home on 09/01/22 with cough, increased urinary frequency, weakness. (+)COVID home test 02/11. 02/12: in ED, (+)COVID PCR, (+)UA, Temp 99.2, HR 100-120, RR 24, CXR neg, renal fxn close to baseline.  02/13: UCx (+)Ecoli, susceptibilities pending. 2/14: E. coli sensitive to keflex.  Patient is not hypoxic with regards to her COVID infection.  PT and OT recommended outpatient PT and OT.  Patient feels better and wants to be discharged.  Patient is being discharged home.   Discharge Diagnoses:  Principal Problem:   UTI (urinary tract infection) Active Problems:   COVID-19 virus infection   Paroxysmal atrial fibrillation (HCC)   DM (diabetes mellitus), type 2 with neurological complications (HCC)   Hyperlipidemia   Essential hypertension   CVA (cerebral vascular accident) ([place])   Chronic kidney disease, stage 3a (HCC)   Chronic diastolic CHF (congestive heart failure) (HCC)   OSA (obstructive sleep apnea)   Obesity (BMI 30-39.9)  UTI (urinary tract infection) Meeting sepsis criteria on admission w/ tachycardia, tachypnea though elevated HR can also be d/t Afib. IV rocephin > changed to Keflex. Urine culture grew E coli.   COVID-19 virus infection:   No oxygen desaturation, no infiltration on chest x-ray.   Patient is on Eliquis, cannot use Paxlovid. As needed albuterol and Mucinex   [name]):  Heart rate 110-120 on admission, improved  Continue Eliquis As needed metoprolol 2.5 mg every 2 hours for heart rate> 125   DM (diabetes mellitus), type 2 with neurological complications [name]):  Recent A1c 7.4, decently controlled.   Patient at home is taking metformin, Farxiga, glipizide. SSI   Hyperlipidemia Crestor   Essential hypertension IV hydralazine as needed Amlodipine   CVA (cerebral vascular accident) ([place]) Eliquis and Crestor   Chronic kidney disease, stage 3a ([name]):  Renal function at baseline. Follow-up with BMP   Chronic diastolic CHF (congestive heart failure) ([name]):  2D echo on 08/02/2021 showed EF of 55-60% with grade 1 diastolic dysfunction.   No leg edema or JVD.  CHF is compensated.  BNP 153 Watch volume status closely   OSA (obstructive sleep apnea) CPAP   Obesity (BMI 30-39.9): Body weight 86.2 kg, BMI 32.61 Exercise and healthy diet Encourage losing weight  Discharge Instructions  Discharge Instructions     Call MD for:  difficulty breathing, headache or visual disturbances   Complete by: As directed    Call MD for:  persistant dizziness or light-headedness   Complete by: As directed    Call MD for:  persistant nausea and vomiting   Complete by: As directed    Diet - low sodium heart healthy   Complete by: As directed    Diet Carb Modified   Complete by: As directed    Discharge instructions   Complete by: As directed  Advised to follow-up with primary care physician in 1 week. Advised to take Keflex 500 mg twice daily for 5 days for UTI. Advised to drink more fluids.   Increase activity slowly   Complete by: As directed       Allergies as of 09/03/2022       Reactions   Codeine Other (See Comments)   Makes her very loopy and groggy   Neosporin  [neomycin-bacitracin Zn-polymyx] Hives   Ozempic [semaglutide] Hives   Polymyxin B Other (See Comments)   Eyes redness   Sulfa Antibiotics Itching        Medication List     STOP taking these medications    simvastatin 40 MG tablet Commonly known as: ZOCOR       TAKE these medications    acetaminophen 325 MG tablet Commonly known as: TYLENOL Take 2 tablets (650 mg total) by mouth every 4 (four) hours as needed for mild pain (or temp > 37.5 C (99.5 F)).   amLODipine 2.5 MG tablet Commonly known as: NORVASC Take 1 tablet (2.5 mg total) by mouth daily.   apixaban 5 MG Tabs tablet Commonly known as: Eliquis Take 1 tablet (5 mg total) by mouth 2 (two) times daily.   atorvastatin 20 MG tablet Commonly known as: LIPITOR Take 20 mg by mouth daily.   celecoxib 200 MG capsule Commonly known as: CELEBREX Take 200 mg by mouth 2 (two) times daily.   cephALEXin 500 MG capsule Commonly known as: KEFLEX Take 1 capsule (500 mg total) by mouth 2 (two) times daily for 5 days.   Farxiga 10 MG Tabs tablet Generic drug: dapagliflozin propanediol Take 10 mg by mouth daily.   gabapentin 100 MG capsule Commonly known as: NEURONTIN Take 200 mg by mouth at bedtime.   glipiZIDE 5 MG 24 hr tablet Commonly known as: GLUCOTROL XL Take 1 tablet (5 mg total) by mouth 2 (two) times daily with a meal.   magnesium oxide 400 MG tablet Commonly known as: MAG-OX Take 1 tablet (400 mg total) by mouth at bedtime.   memantine 10 MG tablet Commonly known as: Namenda Take 1 tablet (10 mg total) by mouth 2 (two) times daily. Start taking on: September 25, 2022 What changed: Another medication with the same name was removed. Continue taking this medication, and follow the directions you see here.   metFORMIN 500 MG tablet Commonly known as: GLUCOPHAGE Take 2 tablets (1,000 mg total) by mouth 2 (two) times daily.   pantoprazole 40 MG tablet Commonly known as: PROTONIX Take 1 tablet (40 mg  total) by mouth daily.   PreserVision AREDS 2 Caps Take 1 capsule by mouth 2 (two) times daily.   Vitamin D (Ergocalciferol) 1.25 MG (50000 UNIT) Caps capsule Commonly known as: DRISDOL Take 1 capsule (50,000 Units total) by mouth every 7 (seven) days.        Follow-up Information     Maryland Pink, MD Follow up.   Specialty: Family Medicine Contact information: [address] [place] [address] [phone]                Allergies  Allergen Reactions   Codeine Other (See Comments)    Makes her very loopy and groggy   Neosporin [Neomycin-Bacitracin Zn-Polymyx] Hives   Ozempic [Semaglutide] Hives   Polymyxin B Other (See Comments)    Eyes redness   Sulfa Antibiotics Itching    Consultations: None   Procedures/Studies: DG Chest Port 1 [address]  Result Date: 09/01/2022 CLINICAL DATA:  Positive COVID-19 home test.  [id number].  Weakness. EXAM: PORTABLE CHEST 1 VIEW COMPARISON:  AP and lateral 08/15/2021. FINDINGS: 5:43 a.m. Stable mild-to-moderate cardiomegaly. No vascular congestion is seen. A loop recorder device again superimposes in the left chest wall. There is calcification in the transverse aorta. The mediastinum is normally outlined. The lungs are clear. The sulci are sharp. Thoracic spondylosis. IMPRESSION: 1. No evidence of acute chest disease. Stable cardiomegaly. 2. Aortic atherosclerosis. 3. Loop recorder device. Electronically Signed   By: Telford Nab M.D.   On: 09/01/2022 06:20   CUP PACEART REMOTE DEVICE CHECK  Result Date: 08/12/2022 ILR summary report received. Battery status OK. Normal device function. No new symptom, tachy, brady, or pause episodes. No new AF episodes. Monthly summary reports and ROV/PRN Hx of PAF, Eliquis [place]   Subjective: Patient was seen and examined at bedside.  Overnight events noted.   Patient reports doing much better and wants to be discharged.  She denies any urinary symptoms.  Discharge  Exam: Vitals:   09/03/22 0743 09/03/22 1607  BP: (!) 143/60 (!) 161/71  Pulse: 85 94  Resp: 17 16  Temp: 98.7 F (37.1 C) 99.2 F (37.3 C)  SpO2: 92% 92%   Vitals:   09/03/22 0009 09/03/22 0429 09/03/22 0743 09/03/22 1607  BP: (!) 132/50 138/62 (!) 143/60 (!) 161/71  Pulse: 87 82 85 94  Resp: 18 18 17 16  $ Temp: 99.1 F (37.3 C) 98.8 F (37.1 C) 98.7 F (37.1 C) 99.2 F (37.3 C)  TempSrc:  Oral    SpO2: 93% 95% 92% 92%  Weight:  89.6 kg    Height:        General: Pt is alert, awake, not in acute distress Cardiovascular: RRR, S1/S2 +, no rubs, no gallops Respiratory: CTA bilaterally, no wheezing, no rhonchi Abdominal: Soft, NT, ND, bowel sounds + Extremities: no edema, no cyanosis    The results of significant diagnostics from this hospitalization (including imaging, microbiology, ancillary and laboratory) are listed below for reference.     Microbiology: Recent Results (from the past 240 hour(s))  Urine Culture (for pregnant, neutropenic or urologic patients or patients with an indwelling urinary catheter)     Status: Abnormal   Collection Time: 09/01/22  6:51 AM   Specimen: Urine, Clean Catch  Result Value Ref Range Status   Specimen Description   Final    URINE, CLEAN CATCH Performed at [facility], [address] [place], [place] [address]    Special Requests   Final    NONE Performed at [facility], [place], [place] [address]    Culture 40,000 COLONIES/mL ESCHERICHIA COLI (A)  Final   Report Status 09/03/2022 FINAL  Final   Organism ID, Bacteria ESCHERICHIA COLI (A)  Final      Susceptibility   Escherichia coli - MIC*    AMPICILLIN >=32 RESISTANT Resistant     CEFAZOLIN <=4 SENSITIVE Sensitive     CEFEPIME <=0.12 SENSITIVE Sensitive     CEFTRIAXONE <=0.25 SENSITIVE Sensitive     CIPROFLOXACIN <=0.25 SENSITIVE Sensitive     GENTAMICIN <=1 SENSITIVE Sensitive     IMIPENEM <=0.25 SENSITIVE Sensitive      NITROFURANTOIN <=16 SENSITIVE Sensitive     TRIMETH/SULFA <=20 SENSITIVE Sensitive     AMPICILLIN/SULBACTAM >=32 RESISTANT Resistant     PIP/TAZO <=4 SENSITIVE Sensitive     * 40,000 COLONIES/mL ESCHERICHIA COLI  Resp panel by RT-PCR (RSV, Flu  A&B, Covid) Anterior Nasal Swab     Status: Abnormal   Collection Time: 09/01/22  8:32 AM   Specimen: Anterior Nasal Swab  Result Value Ref Range Status   SARS Coronavirus 2 by RT PCR POSITIVE (A) NEGATIVE Final    Comment: (NOTE) SARS-CoV-2 target nucleic acids are DETECTED.  The SARS-CoV-2 RNA is generally detectable in upper respiratory specimens during the acute phase of infection. Positive results are indicative of the presence of the identified virus, but do not rule out bacterial infection or co-infection with other pathogens not detected by the test. Clinical correlation with patient history and other diagnostic information is necessary to determine patient infection status. The expected result is Negative.  Fact Sheet for Patients: EntrepreneurPulse.com.au  Fact Sheet for Healthcare Providers: IncredibleEmployment.be  This test is not yet approved or cleared by the [place] FDA and  has been authorized for detection and/or diagnosis of SARS-CoV-2 by FDA under an Emergency Use Authorization (EUA).  This EUA will remain in effect (meaning this test can be used) for the duration of  the COVID-19 declaration under Section 564(b)(1) of the A ct, 21 U.S.C. section 360bbb-3(b)(1), unless the authorization is terminated or revoked sooner.     Influenza A by PCR NEGATIVE NEGATIVE Final   Influenza B by PCR NEGATIVE NEGATIVE Final    Comment: (NOTE) The Xpert Xpress SARS-CoV-2/FLU/RSV plus assay is intended as an aid in the diagnosis of influenza from Nasopharyngeal swab specimens and should not be used as a sole basis for treatment. Nasal washings and aspirates are unacceptable for Xpert Xpress  SARS-CoV-2/FLU/RSV testing.  Fact Sheet for Patients: EntrepreneurPulse.com.au  Fact Sheet for Healthcare Providers: IncredibleEmployment.be  This test is not yet approved or cleared by the [place] FDA and has been authorized for detection and/or diagnosis of SARS-CoV-2 by FDA under an Emergency Use Authorization (EUA). This EUA will remain in effect (meaning this test can be used) for the duration of the COVID-19 declaration under Section 564(b)(1) of the Act, 21 U.S.C. section 360bbb-3(b)(1), unless the authorization is terminated or revoked.     Resp Syncytial Virus by PCR NEGATIVE NEGATIVE Final    Comment: (NOTE) Fact Sheet for Patients: EntrepreneurPulse.com.au  Fact Sheet for Healthcare Providers: IncredibleEmployment.be  This test is not yet approved or cleared by the [place] FDA and has been authorized for detection and/or diagnosis of SARS-CoV-2 by FDA under an Emergency Use Authorization (EUA). This EUA will remain in effect (meaning this test can be used) for the duration of the COVID-19 declaration under Section 564(b)(1) of the Act, 21 U.S.C. section 360bbb-3(b)(1), unless the authorization is terminated or revoked.  Performed at [facility], [address] [place], [place] [address]      Labs: BNP (last 3 results) Recent Labs    09/01/22 0558  BNP [id number]   Basic Metabolic Panel: Recent Labs  Lab 09/01/22 0558 09/02/22 0414 09/03/22 0536  NA 140 140 139  K 4.1 3.8 3.7  CL 104 106 107  CO2 25 25 24  $ GLUCOSE 149* 107* 116*  BUN 26* 20 25*  CREATININE 1.10* 1.10* 1.17*  CALCIUM 9.1 8.7* 8.4*  MG 1.7  --   --    Liver Function Tests: Recent Labs  Lab 09/01/22 0558  AST 29  ALT 27  ALKPHOS 98  BILITOT 0.7  PROT 7.2  ALBUMIN 4.1   No results for input(s): "LIPASE", "AMYLASE" in the last 168 hours. No results for input(s): "AMMONIA" in the last  168 hours. CBC: Recent Labs  Lab 09/01/22 0558 09/02/22 0414  WBC 11.1* 7.0  NEUTROABS 9.6*  --   HGB 14.2 12.3  HCT 44.0 38.4  MCV 91.5 91.2  PLT 217 174   Cardiac Enzymes: No results for input(s): "CKTOTAL", "CKMB", "CKMBINDEX", "TROPONINI" in the last 168 hours. BNP: Invalid input(s): "POCBNP" CBG: Recent Labs  Lab 09/02/22 1639 09/02/22 2009 09/03/22 0741 09/03/22 1125 09/03/22 1608  GLUCAP 182* 165* 95 121* 184*   D-Dimer No results for input(s): "DDIMER" in the last 72 hours. Hgb A1c No results for input(s): "HGBA1C" in the last 72 hours. Lipid Profile No results for input(s): "CHOL", "HDL", "LDLCALC", "TRIG", "CHOLHDL", "LDLDIRECT" in the last 72 hours. Thyroid function studies No results for input(s): "TSH", "T4TOTAL", "T3FREE", "THYROIDAB" in the last 72 hours.  Invalid input(s): "FREET3" Anemia work up No results for input(s): "VITAMINB12", "FOLATE", "FERRITIN", "TIBC", "IRON", "RETICCTPCT" in the last 72 hours. Urinalysis    Component Value Date/Time   COLORURINE YELLOW (A) 09/01/2022 0558   APPEARANCEUR CLEAR (A) 09/01/2022 0558   APPEARANCEUR Clear 11/23/2019 1115   LABSPEC 1.018 09/01/2022 0558   PHURINE 5.0 09/01/2022 0558   GLUCOSEU >=500 (A) 09/01/2022 0558   HGBUR NEGATIVE 09/01/2022 0558   BILIRUBINUR NEGATIVE 09/01/2022 0558   BILIRUBINUR Negative 11/23/2019 1115   KETONESUR 5 (A) 09/01/2022 0558   PROTEINUR NEGATIVE 09/01/2022 0558   NITRITE NEGATIVE 09/01/2022 0558   LEUKOCYTESUR SMALL (A) 09/01/2022 0558   Sepsis Labs Recent Labs  Lab 09/01/22 0558 09/02/22 0414  WBC 11.1* 7.0   Microbiology Recent Results (from the past 240 hour(s))  Urine Culture (for pregnant, neutropenic or urologic patients or patients with an indwelling urinary catheter)     Status: Abnormal   Collection Time: 09/01/22  6:51 AM   Specimen: Urine, Clean Catch  Result Value Ref Range Status   Specimen Description   Final    URINE, CLEAN CATCH Performed  at [facility], [address] [place], [place] [address]    Special Requests   Final    NONE Performed at [facility], [place], [place] [address]    Culture 40,000 COLONIES/mL ESCHERICHIA COLI (A)  Final   Report Status 09/03/2022 FINAL  Final   Organism ID, Bacteria ESCHERICHIA COLI (A)  Final      Susceptibility   Escherichia coli - MIC*    AMPICILLIN >=32 RESISTANT Resistant     CEFAZOLIN <=4 SENSITIVE Sensitive     CEFEPIME <=0.12 SENSITIVE Sensitive     CEFTRIAXONE <=0.25 SENSITIVE Sensitive     CIPROFLOXACIN <=0.25 SENSITIVE Sensitive     GENTAMICIN <=1 SENSITIVE Sensitive     IMIPENEM <=0.25 SENSITIVE Sensitive     NITROFURANTOIN <=16 SENSITIVE Sensitive     TRIMETH/SULFA <=20 SENSITIVE Sensitive     AMPICILLIN/SULBACTAM >=32 RESISTANT Resistant     PIP/TAZO <=4 SENSITIVE Sensitive     * 40,000 COLONIES/mL ESCHERICHIA COLI  Resp panel by RT-PCR (RSV, Flu A&B, Covid) Anterior Nasal Swab     Status: Abnormal   Collection Time: 09/01/22  8:32 AM   Specimen: Anterior Nasal Swab  Result Value Ref Range Status   SARS Coronavirus 2 by RT PCR POSITIVE (A) NEGATIVE Final    Comment: (NOTE) SARS-CoV-2 target nucleic acids are DETECTED.  The SARS-CoV-2 RNA is generally detectable in upper respiratory specimens during the acute phase of infection. Positive results are indicative of the presence of the identified virus, but do not rule out bacterial  infection or co-infection with other pathogens not detected by the test. Clinical correlation with patient history and other diagnostic information is necessary to determine patient infection status. The expected result is Negative.  Fact Sheet for Patients: EntrepreneurPulse.com.au  Fact Sheet for Healthcare Providers: IncredibleEmployment.be  This test is not yet approved or cleared by the [place] FDA and  has been authorized for detection and/or  diagnosis of SARS-CoV-2 by FDA under an Emergency Use Authorization (EUA).  This EUA will remain in effect (meaning this test can be used) for the duration of  the COVID-19 declaration under Section 564(b)(1) of the A ct, 21 U.S.C. section 360bbb-3(b)(1), unless the authorization is terminated or revoked sooner.     Influenza A by PCR NEGATIVE NEGATIVE Final   Influenza B by PCR NEGATIVE NEGATIVE Final    Comment: (NOTE) The Xpert Xpress SARS-CoV-2/FLU/RSV plus assay is intended as an aid in the diagnosis of influenza from Nasopharyngeal swab specimens and should not be used as a sole basis for treatment. Nasal washings and aspirates are unacceptable for Xpert Xpress SARS-CoV-2/FLU/RSV testing.  Fact Sheet for Patients: EntrepreneurPulse.com.au  Fact Sheet for Healthcare Providers: IncredibleEmployment.be  This test is not yet approved or cleared by the [place] FDA and has been authorized for detection and/or diagnosis of SARS-CoV-2 by FDA under an Emergency Use Authorization (EUA). This EUA will remain in effect (meaning this test can be used) for the duration of the COVID-19 declaration under Section 564(b)(1) of the Act, 21 U.S.C. section 360bbb-3(b)(1), unless the authorization is terminated or revoked.     Resp Syncytial Virus by PCR NEGATIVE NEGATIVE Final    Comment: (NOTE) Fact Sheet for Patients: EntrepreneurPulse.com.au  Fact Sheet for Healthcare Providers: IncredibleEmployment.be  This test is not yet approved or cleared by the [place] FDA and has been authorized for detection and/or diagnosis of SARS-CoV-2 by FDA under an Emergency Use Authorization (EUA). This EUA will remain in effect (meaning this test can be used) for the duration of the COVID-19 declaration under Section 564(b)(1) of the Act, 21 U.S.C. section 360bbb-3(b)(1), unless the authorization is terminated  or revoked.  Performed at [facility], [address], [place], [place] [address]      Time coordinating discharge: Over 30 minutes  SIGNED:   Shawna Clamp, MD  [facility] 09/03/2022, 4:28 PM Pager   If 7PM-7AM, please contact night-coverage

## 2022-09-03 NOTE — Progress Notes (Signed)
Occupational Therapy Treatment Patient Details Name: Alyssa Mayo MRN: [id number] DOB: 01-13-1946 Today's Date: 09/03/2022   History of present illness Pt is a 77 y.o. female presenting to hospital 09/01/22 with c/o weakness, cough, and increased urinary frequency; recent positive COVID-19 test at home.  H/o CVA 07/2021 with residual L shoulder pain (currently working with OP PT/OT).  Pt admitted with UTI, COVID-19 virus infection, and paroxysmal a-fib.  PMH includes DM, htn, macular degeneration, sleep apnea, cervical laminectomy.   OT comments  Pt received seated in recliner. Appearing alert and improved from presentation with OT Monday; willing to work with OT on grooming at sink/EOB. T/f independently to standing and walked around bed to the sink. See flowsheet below for further details of session. Left sidelying in bed to rest, bed alarm on, with all needs in reach.     Recommendations for follow up therapy are one component of a multi-disciplinary discharge planning process, led by the attending physician.  Recommendations may be updated based on patient status, additional functional criteria and insurance authorization.    Follow Up Recommendations  Outpatient OT     Assistance Recommended at Discharge Intermittent Supervision/Assistance  Patient can return home with the following  Assistance with cooking/housework;Direct supervision/assist for medications management;Assist for transportation;Direct supervision/assist for financial management   Equipment Recommendations  None recommended by OT    Recommendations for Other Services      Precautions / Restrictions Precautions Precautions: Fall Precaution Comments: Isolation- airborne/contact for Covid+ Restrictions Weight Bearing Restrictions: No       Mobility Bed Mobility Overal bed mobility: Modified Independent             General bed mobility comments: Able to return to semi-reclined in bed without assistance;  doffed shoes prior to getting into bed.    Transfers                   General transfer comment: pt able to t/f to standing from recliner chair and walk around bed without AD and without loss of balance.     Balance                                           ADL either performed or assessed with clinical judgement   ADL Overall ADL's : At baseline                                       General ADL Comments: Pt participated in seated hair washing at EOB with no-rinse shower cap; MOD A of OT due to decreased activity tolerance and decreased LUE use (old CVA). Pt able to brush teeth standing at sink without assistance.    Extremity/Trunk Assessment Upper Extremity Assessment Upper Extremity Assessment: Overall WFL for tasks assessed   Lower Extremity Assessment Lower Extremity Assessment: Defer to PT evaluation        Vision       Perception     Praxis      Cognition Arousal/Alertness: Awake/alert Behavior During Therapy: WFL for tasks assessed/performed Overall Cognitive Status: Within Functional Limits for tasks assessed                                 General Comments: Very  pleasant; motivated; states she's hoping to d/c home today.        Exercises      Shoulder Instructions       General Comments Continues to have fatigue from Covid; pt also noting that she has experienced fatigue since CVA. discussed energy conservation strategies with activities such as showering that consume a lot of energy.    Pertinent Vitals/ Pain       Pain Assessment Pain Assessment: No/denies pain  Home Living                                          Prior Functioning/Environment              Frequency  Min 1X/week        Progress Toward Goals  OT Goals(current goals can now be found in the care plan section)  Progress towards OT goals: Progressing toward goals  Acute Rehab OT  Goals Patient Stated Goal: Get better; go home. OT Goal Formulation: With patient Time For Goal Achievement: 09/15/22 Potential to Achieve Goals: Good ADL Goals Additional ADL Goal #1: Pt will perform all ADLs with MOD (I) with baseline activity tolerance by d/c.  Plan Discharge plan remains appropriate    Co-evaluation                 AM-PAC OT "6 Clicks" Daily Activity     Outcome Measure   Help from another person eating meals?: None Help from another person taking care of personal grooming?: None Help from another person toileting, which includes using toliet, bedpan, or urinal?: None Help from another person bathing (including washing, rinsing, drying)?: A Little Help from another person to put on and taking off regular upper body clothing?: None Help from another person to put on and taking off regular lower body clothing?: None 6 Click Score: 23    End of Session    OT Visit Diagnosis: Muscle weakness (generalized) (M62.81)   Activity Tolerance Patient tolerated treatment well   Patient Left in bed;with bed alarm set;with call bell/phone within reach   Nurse Communication Mobility status        Time: [id number] OT Time Calculation (min): 26 min  Charges: OT General Charges $OT Visit: 1 Visit OT Treatments $Self Care/Home Management : 23-37 mins  Waymon Amato, MS, OTR/L   Vania Rea 09/03/2022, 10:18 AM

## 2022-09-03 NOTE — Progress Notes (Signed)
   09/03/22 1100  Spiritual Encounters  Type of Visit Initial  Care provided to: Patient  Referral source Physician  Reason for visit Advance directives  OnCall Visit Yes   Chaplain responded to [facility] consult to provide AD paperwork. Instructions given to patient to have nurse call when completed to finalize with notary.

## 2022-09-04 ENCOUNTER — Ambulatory Visit: Payer: Medicare HMO | Admitting: Occupational Therapy

## 2022-09-04 ENCOUNTER — Telehealth: Payer: Self-pay | Admitting: Adult Health

## 2022-09-04 ENCOUNTER — Encounter: Payer: Medicare HMO | Admitting: Speech Pathology

## 2022-09-04 ENCOUNTER — Ambulatory Visit: Payer: Medicare HMO

## 2022-09-04 NOTE — Telephone Encounter (Signed)
Pt son is asking to be called to discuss medications since pt has been discharged from hospital.

## 2022-09-04 NOTE — Telephone Encounter (Signed)
Contacted son back, he stated pt was in hospital and they questioned if she was taking Simvastatin or Atorvastatin as patient hs been taking Simvastatin.  I informed him I looked back to notes to Jan 2023 and she Switched home dose simvastatin to atorvastatin 20 mg daily. At the last few visit it was reported she was taking Atorvastatin and not Simvastatin and PCP should be managing that. He stated she has a FU with PCP next week and will discuss with him when they go.

## 2022-09-08 ENCOUNTER — Encounter: Payer: Self-pay | Admitting: Occupational Therapy

## 2022-09-08 NOTE — Therapy (Signed)
[facility] at [facility] [place], [place], [address] Phone: [phone]   Fax:  [phone]  September 08, 2022    No Recipients  Occupational Therapy Discharge Summary   Patient: Alyssa Mayo MRN: [id number] Date of Birth: 1946-07-21  Diagnosis: No diagnosis found.  No data recorded  The above patient had been seen in Occupational Therapy 38 treatments sessions.  The treatment consisted of  ADL, IADL training, UE there. Ex, neuromuscular re-ed, Pt./caregiver education The patient is: Improved    Discharge Findings:  Pt. was admitted to [facility] with COVID-19.   Functional Status at Discharge: See  goal section below Goals partially met.   GOALS: Goals reviewed with patient? Yes     SHORT TERM GOALS: Target date:  04/15/2022       Pt. Will improve FOTO score by 2 points to reflect improved pt. perceived functional performance Baseline: FOTO: 57, TR score: 62 10th visit: FOTO: 65, 20th visit: FOTO 62, TR score: 62, 30th visit: FOTO score: 60 Goal status: Achieved   LONG TERM GOALS: Target date: 10/23/2022     Pt. Will improve left UE strength by 2 mm grades to assist with repositioning her grandson on her lap. Baseline: Eval: left shoulder flexion: 3-/5, abduction 3-/5,  elbow flex/ext. 4-/5, wrist extension 3/5, wrist flexion 3-/5  10th visit: left shoulder flexion: 3+/5, abduction 3-+5,  elbow flex/ext. 4+/5, wrist extension 4/5, wrist flexion 4/5 05/27/2022: left shoulder flexion: 3+/5, abduction 3+/5,  elbow flex/ext. 5/5, wrist extension 4+/5, wrist flexion 4+/5  20th visit: left shoulder flexion: 3+/5, abduction 3+/5,  elbow flex/ext. 5/5, wrist extension 4+/5, wrist flexion 4+/5. Pt. can now hold her grandson grandson on her lap, and reposition him. Pt. Is unable to hold him in standing. 30th visit: left shoulder flexion: 3+/5, abduction 3+/5,  elbow flex/ext. 5/5, wrist extension 4+/5, wrist flexion  4+/5 Goal status: D/C: progressed overall since initial eval   2.  Pt. will improve left grip strength by 5# to be able to independently hold and use a blow dryer, and brush for hair care. Baseline: Eval: Left grip strength 13#. Pt. has difficulty performing hair care, holding and using a brush, and blow dryer. 10th visit: Left grip 14#. Pt. Is now able to hold th e blow dryer for the sides, and top of head, not the back. 11/07: Left 17# Pt. Has difficulty blow drying then back of her head.20th visit: Left grip 18#. Pt. Is able to independently hold a brush, and blow dry her hair.  Goal status: Achieved   3.  Pt. Will improve left lateral pinch strength by 3# to be able to independently cut meat. Baseline: Eval: Left 8, Pt. Has difficulty cutting meat. 10th: Left: 10#.  Pt. is able to cut tender meat, however is not as efficient 11/07: Left 11#. Pt. Is independent cutting meat with increased time. 20th visit: Left: 10#. Pt. has difficulty positioning, and stabilizing the utensils while cutting meat. Goal status:  Achieved   4.  Pt. Will improve Left hand [facility] skills to be able to be able to independently, and efficiently manipulate small objects during ADL tasks. Baseline: Eval: left: 1 min. 9 sec. 10th visit: Left: 1 min. Pt.is improving with manipulating small objects. 11/07: Left: 1 min. & 4 sec. Pt. has difficulty manipulating small objects. 20th visit: Left:  1 min. Pt. is improving with grasping pills, however continues to have difficulty. 30th visit: Left:  1 min. Pt. Is  improving with grasping pills, however continues to have difficulty. Goal status:  D/C progressed overall since the initial eval.   5.  Pt. Will perform light meal preparation with modified independence Baseline: Eval: Pt. reports having difficulty performing light  meal preparation, and cooking tasks. 10th visit: Pt. is able to make toast in the morning, a sandwich,  use the microwave, make coffee, and pour herself a drink.  11/07: Pt. has difficulty holding a coffee pot when filling the reservoir 20th visit: Pt. Is making waffles in the toaster, heats items in the microwave. Pt. uses the oven when supervised. 30th visit: Pt. continues to perform light meal prep, hand heats in the microwave. Goal status: D/C: Progressed overall since initial eval 6.: Pt. will improve with left grip strength to be able to securely hold a drink.              Baseline: 30th visit: Left grip strength 13#. Pt. has difficulty holding a full drink  securely with the left hand.             Goal status: D/C: Pt. Has difficulty securely holding a full drink securely in the left hand   7. Pt. Will improve typing speed, and accuracy in preparation for efficiently typing simple email message.             Baseline: 30th visit: Typing net speed: 3 wpm, accuracy 48% for a 1 min. Typing test.             Goal status:  D/C: Typing continues to be limited.                   Sincerely,  Harrel Carina, MS, OTR/L   CC No Recipients  [facility] at [facility] [place], [place], [address] Phone: [phone]   Fax:  [phone]  Patient: Alyssa Mayo MRN: [id number] Date of Birth: [birth year]

## 2022-09-09 ENCOUNTER — Encounter: Payer: Medicare HMO | Admitting: Speech Pathology

## 2022-09-09 ENCOUNTER — Ambulatory Visit: Payer: Medicare HMO

## 2022-09-09 ENCOUNTER — Ambulatory Visit: Payer: Medicare HMO | Admitting: Occupational Therapy

## 2022-09-11 ENCOUNTER — Ambulatory Visit: Payer: Medicare HMO

## 2022-09-11 ENCOUNTER — Ambulatory Visit: Payer: Medicare HMO | Admitting: Occupational Therapy

## 2022-09-11 ENCOUNTER — Encounter: Payer: Medicare HMO | Admitting: Speech Pathology

## 2022-09-12 LAB — CUP PACEART REMOTE DEVICE CHECK
Date Time Interrogation Session: 20240221231457
Implantable Pulse Generator Implant Date: 20230116

## 2022-09-15 ENCOUNTER — Ambulatory Visit: Payer: Medicare HMO

## 2022-09-15 DIAGNOSIS — I639 Cerebral infarction, unspecified: Secondary | ICD-10-CM | POA: Diagnosis not present

## 2022-09-16 ENCOUNTER — Ambulatory Visit: Payer: Medicare HMO

## 2022-09-16 ENCOUNTER — Ambulatory Visit: Payer: Medicare HMO | Admitting: Occupational Therapy

## 2022-09-16 ENCOUNTER — Encounter: Payer: Medicare HMO | Admitting: Speech Pathology

## 2022-09-16 DIAGNOSIS — M6281 Muscle weakness (generalized): Secondary | ICD-10-CM

## 2022-09-16 DIAGNOSIS — M542 Cervicalgia: Secondary | ICD-10-CM

## 2022-09-16 NOTE — Therapy (Signed)
OUTPATIENT OCCUPATIONAL THERAPY NEURO EVALUATION   Patient Name: Alyssa Mayo MRN: [id number] DOB:[birth year], 77 y.o., female Today's Date: 03/14/2022   PCP: [place] Pink, MD REFERRING PROVIDER: Frann Rider, NP     OT End of Session - 09/16/22 1455     Visit Number 1    Number of Visits 24    Date for OT Re-Evaluation 12/09/22    Authorization Type Progress reporting period starting 09/16/2022    OT Start Time 1108    OT Stop Time 1145    OT Time Calculation (min) 37 min    Activity Tolerance Patient tolerated treatment well    Behavior During Therapy [facility] for tasks assessed/performed                                    Past Medical History:  Diagnosis Date   Abnormal levels of other serum enzymes 07/17/2015   Arthritis     Constipation 07/11/2015   COVID-19 03/06/2021   Diabetes mellitus without complication ([place])     Elevated liver enzymes     Fatty liver     Generalized abdominal pain 07/11/2015   Hyperlipidemia     Hypertension     Lifelong obesity     Macular degeneration     Morbid obesity due to excess calories ([place]) 07/11/2015   Other fatigue 07/11/2015   Sleep apnea     Spinal headache           Past Surgical History:  Procedure Laterality Date   ABDOMINAL HYSTERECTOMY       APPENDECTOMY       CATARACT EXTRACTION       CERVICAL LAMINECTOMY   07/21/1985   CESAREAN SECTION       COLONOSCOPY   07/21/2012    diverticulosis, [facility] Dr. Donnella Sham    COLONOSCOPY WITH PROPOFOL N/A 02/04/2019    Procedure: COLONOSCOPY WITH PROPOFOL;  Surgeon: Lollie Sails, MD;  Location: [facility] ENDOSCOPY;  Service: Endoscopy;  Laterality: N/A;   COLONOSCOPY WITH PROPOFOL N/A 03/18/2021    Procedure: COLONOSCOPY WITH PROPOFOL;  Surgeon: Lesly Rubenstein, MD;  Location: [facility] ENDOSCOPY;  Service: Endoscopy;  Laterality: N/A;  DM   DIAGNOSTIC LAPAROSCOPY       ESOPHAGOGASTRODUODENOSCOPY (EGD) WITH PROPOFOL N/A 02/04/2019    Procedure: ESOPHAGOGASTRODUODENOSCOPY  (EGD) WITH PROPOFOL;  Surgeon: Lollie Sails, MD;  Location: [facility] ENDOSCOPY;  Service: Endoscopy;  Laterality: N/A;   ESOPHAGOGASTRODUODENOSCOPY (EGD) WITH PROPOFOL N/A 03/18/2021    Procedure: ESOPHAGOGASTRODUODENOSCOPY (EGD) WITH PROPOFOL;  Surgeon: Lesly Rubenstein, MD;  Location: [facility] ENDOSCOPY;  Service: Endoscopy;  Laterality: N/A;   LOOP RECORDER INSERTION N/A 08/05/2021    Procedure: LOOP RECORDER INSERTION;  Surgeon: Evans Lance, MD;  Location: [place] [facility] LAB;  Service: Cardiovascular;  Laterality: N/A;   Mobile       TUBAL LIGATION            Patient Active Problem List    Diagnosis Date Noted   Paroxysmal atrial fibrillation ([place]) 08/08/2022   Hypercoagulable state due to paroxysmal atrial fibrillation ([place]) 08/08/2022   Cerebral edema ([place]) 08/07/2021   OSA (obstructive sleep apnea) 08/07/2021   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   CVA (cerebral vascular accident) ([place]) 08/01/2021   GERD (gastroesophageal reflux disease) 07/20/2019   Advanced care planning/counseling discussion 12/17/2016   Skin lesions, generalized 11/13/2016   Chronic right hip pain 06/17/2016  Vitamin D deficiency 09/26/2015   Diastasis recti 08/08/2015   Elevated serum GGT level 07/24/2015   Essential hypertension 07/17/2015   Fatty liver 07/17/2015   Elevated alkaline phosphatase level 07/17/2015   Elevated serum glutamic pyruvic transaminase (SGPT) level 07/17/2015   Abnormal finding on EKG 07/11/2015   Morbid obesity ([place]) 07/11/2015   Colon, diverticulosis 07/11/2015   Abdominal wall hernia 07/11/2015   DM (diabetes mellitus), type 2 with neurological complications ([place])     Hyperlipidemia        ONSET DATE: 08/01/2021   REFERRING DIAG: CVA   THERAPY DIAG:  Muscle weakness (generalized)   Rationale for Evaluation and Treatment Rehabilitation   SUBJECTIVE:    SUBJECTIVE STATEMENT:    Pt. reports that COVID-19 really made her weak.    Pt  accompanied by: self   PERTINENT HISTORY:  Pt. is a 77 y.o. female who had a unexpectedly hospitalized from 2/12-2/14/2024 for COVID-19, and a UTI.  Pt. 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 Pt. attended inpatient rehab from 08/07/2021-08/22/2021. Pt. received Home health therapy services this past spring.  Pt. has recently had an assessment through driver rehabilitation services at [facility] with recommendations for referrals for  outpatient OT/PT, and ST services. Pt. PMHx includes: HTN, Hyperlipidemia, Macular degeneration, DM, and Obesity.   PRECAUTIONS: None   WEIGHT BEARING RESTRICTIONS No   PAIN:  Are you having pain? 4/10 left neck   FALLS: Has patient fallen in last 6 months? Yes.   LIVING ENVIRONMENT: Lives with: lives with their family  Son  Alyssa Mayo Lives in: House/apartment Main living are on one floor Stairs:  2 steps to enter Has following equipment at home: Single point cane, Wheelchair (manual), shower chair, Grab bars, bed rail, and rubber mat.   PLOF: Independent   PATIENT GOALS: To be able to Drive, and cook again   OBJECTIVE:    HAND DOMINANCE: Right    ADLs:  Transfers/ambulation related to ADLs: Independent Eating: Independent Grooming: [employer] Pt. reports being unable to use her left hand to perform haircare.  UB Dressing: Independent pullover shirt, independent now with fastening a bra LB Dressing: Independent donning pants socks, and slide on shoes Toileting: Independent Bathing: Independent Tub Shower transfers: Walk-in shower Independent now     IADLs: Shopping: Needs to be accompanied to the grocery store. Light housekeeping: Do  Meal Prep:  Is able to perform light meal prep Community mobility: Relies on son, and friend Medication management: Son sets up Building surveyor management:TBD Handwriting: TBD   MOBILITY STATUS: Hx of falls out of bed     ACTIVITY TOLERANCE: Activity  tolerance:  10-20 min. Before rest break   FUNCTIONAL OUTCOME MEASURES: FOTO: 61  TR score: 62   UPPER EXTREMITY ROM      Active ROM Right eval Left eval  Shoulder flexion WFL 54(74) scaption  Shoulder abduction [facility] 58(75)  Shoulder adduction      Shoulder extension      Shoulder internal rotation      Shoulder external rotation      Elbow flexion [facility] WFL  Elbow extension [facility] WFL  Wrist flexion WFL TBD  Wrist extension WFL TBD  Wrist ulnar deviation      Wrist radial deviation      Wrist pronation      Wrist supination      (Blank rows = not tested)     UPPER EXTREMITY MMT:      MMT Right eval  Left eval  Shoulder flexion 4+/5 2+/5  Shoulder abduction 4+/5 2+/5  Shoulder adduction      Shoulder extension      Shoulder internal rotation      Shoulder external rotation      Middle trapezius      Lower trapezius      Elbow flexion 4+/5 3+/5  Elbow extension 4+/5 3+/5  Wrist flexion 4+/5 TBD  Wrist extension 4+/5 TBD  Wrist ulnar deviation      Wrist radial deviation      Wrist pronation      Wrist supination      (Blank rows = not tested)   HAND FUNCTION: Grip strength: Right: 33 lbs; Left: 11 lbs, Lateral pinch: Right: 17 lbs, Left: 2 lbs, and 3 point pinch: Right: 17 lbs, Left: 2 lbs   COORDINATION:   9 Hole Peg test: Right: 27 sec.  sec; Left: 52 sec.   SENSATION: Light touch: WFL Proprioception: WFL   COGNITION: Overall cognitive status: Within functional limits for tasks assessed   VISION: Subjective report: Glasses. Just received a new prescription to increase bifocal strength Baseline vision: Macular Degeneration Visual history: macular degeneration   VISION ASSESSMENT: To be further assessed in functional context  Left sided Awareness       PERCEPTION: Limited left sided awareness     TODAY'S TREATMENT:    Initial Evaluation     PATIENT EDUCATION: Education details: Compensatory strategies during typing. Person educated:  Patient Education method: verbal cues Education comprehension: verbalized understanding and pt does not plan to cook unless son is present.     HOME EXERCISE PROGRAM:  Reviewed home activities to enhance left hand digit extension  during typing skills.       GOALS: GOALS: Goals reviewed with patient? Yes     SHORT TERM GOALS: Target date: 10/28/2022     Pt. Will improve FOTO score by 2 points to reflect improved pt. perceived functional performance Baseline: FOTO: 61 TR score: 62  Goal status: INITIAL   LONG TERM GOALS: Target date: 12/09/2022    Pt. Will improve left UE strength by 2 mm grades to assist with repositioning her grandson on her lap. Baseline: Eval: left shoulder flexion, abduction 2+/5, elbow flexion, and extension 3+/5 Goal status: INITIAL   2.  Pt. will be able to independently hold and use a blow dryer, and brush for hair care. Baseline: Eval: Pt. Is unable to sustain her BUEs in elevation, and use a blow dryer, and brush. Goal status: INITIAL   3.  Pt. Will improve left lateral pinch strength by 3# to be able to independently cut meat. Baseline: Eval: 2#. Pt. has difficulty stabilizing utensil, and food while cutting food. Goal status:  INITIAL   4.  Pt. Will improve Left hand [facility] skills to be able to be able to independently, and efficiently manipulate small objects during ADL tasks. Baseline: Eval: Left: 52 sec. Right: 27 sec. Goal status: INITIAL     6.: Pt. will improve with left grip strength to be able to securely hold a drink.              Baseline: Eval:  Left grip strength 11#. Pt. has difficulty holding a full drink  securely with the left hand.             Goal status: INITIAL   7. Pt. Will improve typing speed, and accuracy in preparation for efficiently typing simple email message.  Baseline: Pt. Presents with difficulty typing an email. Typing speed, and accuracy TBD             Goal status: INITIAL                                         CLINICAL IMPRESSION:   Pt. presents with a change in LUE functioning since being hospitalized. Pt. presents with significantly decreased activity tolerance, LUE ROM, grip strength, pinch strength, and [facility] skills which limit her ability to grasp small objects, securely hold items in her left hand, type an email, and  sustain her UEs in elevation while performing hair care. Pt.'s FOTO score is 61. Pt. will benefit from skilled OT services in order to work towards improving, and maximizing independence with ADLs, and IADL tasks.    PERFORMANCE DEFICITS in functional skills including ADLs, IADLs, coordination, dexterity, sensation, ROM, strength, FMC, decreased knowledge of use of DME, vision, and UE functional use, cognitive skills including attention, and psychosocial skills including coping strategies, environmental adaptation, habits, and routines and behaviors.    IMPAIRMENTS are limiting patient from ADLs, IADLs, and social participation.    COMORBIDITIES may have co-morbidities  that affects occupational performance. Patient will benefit from skilled OT to address above impairments and improve overall function.   MODIFICATION OR ASSISTANCE TO COMPLETE EVALUATION: Min-Moderate modification of tasks or assist with assess necessary to complete an evaluation.   OT OCCUPATIONAL PROFILE AND HISTORY: Detailed assessment: Review of records and additional review of physical, cognitive, psychosocial history related to current functional performance.   CLINICAL DECISION MAKING: Moderate - several treatment options, min-mod task modification necessary   REHAB POTENTIAL: Good   EVALUATION COMPLEXITY: Moderate      PLAN: OT FREQUENCY: 2x/week   OT DURATION: 12 weeks   PLANNED INTERVENTIONS: self care/ADL training, therapeutic exercise, therapeutic activity, neuromuscular re-education, manual therapy, passive range of motion, and paraffin   RECOMMENDED OTHER SERVICES: ST, and PT    CONSULTED AND AGREED WITH PLAN OF CARE: Patient   PLAN FOR NEXT SESSION:  L hand strengthening and coordination exercises   Harrel Carina, MS, OTR/L

## 2022-09-16 NOTE — Therapy (Addendum)
OUTPATIENT PHYSICAL THERAPY CERVICAL EVALUATION   Patient Name: Alyssa Mayo MRN: [id number] DOB:07/30/1945, 77 y.o., female Today's Date: 09/16/2022  END OF SESSION:  PT End of Session - 09/16/22 1027     Visit Number 1    Number of Visits 12    Date for PT Re-Evaluation 12/09/22    PT Start Time 1018    PT Stop Time 1118    PT Time Calculation (min) 60 min             Past Medical History:  Diagnosis Date   Abnormal levels of other serum enzymes 07/17/2015   Arthritis    Constipation 07/11/2015   COVID-19 03/06/2021   Diabetes mellitus without complication ([place])    Elevated liver enzymes    Fatty liver    Generalized abdominal pain 07/11/2015   Hyperlipidemia    Hypertension    Lifelong obesity    Macular degeneration    Morbid obesity due to excess calories ([place]) 07/11/2015   Other fatigue 07/11/2015   Sleep apnea    Spinal headache    Past Surgical History:  Procedure Laterality Date   ABDOMINAL HYSTERECTOMY     APPENDECTOMY     CATARACT EXTRACTION     CERVICAL LAMINECTOMY  07/21/1985   CESAREAN SECTION     COLONOSCOPY  07/21/2012   diverticulosis, [facility] Dr. Donnella Sham    COLONOSCOPY WITH PROPOFOL N/A 02/04/2019   Procedure: COLONOSCOPY WITH PROPOFOL;  Surgeon: Lollie Sails, MD;  Location: [facility] ENDOSCOPY;  Service: Endoscopy;  Laterality: N/A;   COLONOSCOPY WITH PROPOFOL N/A 03/18/2021   Procedure: COLONOSCOPY WITH PROPOFOL;  Surgeon: Lesly Rubenstein, MD;  Location: [facility] ENDOSCOPY;  Service: Endoscopy;  Laterality: N/A;  DM   DIAGNOSTIC LAPAROSCOPY     ESOPHAGOGASTRODUODENOSCOPY (EGD) WITH PROPOFOL N/A 02/04/2019   Procedure: ESOPHAGOGASTRODUODENOSCOPY (EGD) WITH PROPOFOL;  Surgeon: Lollie Sails, MD;  Location: [facility] ENDOSCOPY;  Service: Endoscopy;  Laterality: N/A;   ESOPHAGOGASTRODUODENOSCOPY (EGD) WITH PROPOFOL N/A 03/18/2021   Procedure: ESOPHAGOGASTRODUODENOSCOPY (EGD) WITH PROPOFOL;  Surgeon: Lesly Rubenstein, MD;  Location: [facility]  ENDOSCOPY;  Service: Endoscopy;  Laterality: N/A;   LOOP RECORDER INSERTION N/A 08/05/2021   Procedure: LOOP RECORDER INSERTION;  Surgeon: Evans Lance, MD;  Location: [place] CV LAB;  Service: Cardiovascular;  Laterality: N/A;   [place]     TUBAL LIGATION     Patient Active Problem List   Diagnosis Date Noted   UTI (urinary tract infection) 09/01/2022   COVID-19 virus infection 09/01/2022   Chronic diastolic CHF (congestive heart failure) ([place]) 09/01/2022   Chronic kidney disease, stage 3a ([place]) 09/01/2022   Obesity (BMI 30-39.9) 09/01/2022   Paroxysmal atrial fibrillation ([place]) 08/08/2022   Hypercoagulable state due to paroxysmal atrial fibrillation ([place]) 08/08/2022   Cerebral edema ([place]) 08/07/2021   OSA (obstructive sleep apnea) 08/07/2021   Right middle cerebral artery stroke ([place]) 08/07/2021   CVA (cerebral vascular accident) ([place]) 08/01/2021   GERD (gastroesophageal reflux disease) 07/20/2019   Advanced care planning/counseling discussion 12/17/2016   Skin lesions, generalized 11/13/2016   Chronic right hip pain 06/17/2016   Vitamin D deficiency 09/26/2015   Diastasis recti 08/08/2015   Elevated serum GGT level 07/24/2015   Essential hypertension 07/17/2015   Fatty liver 07/17/2015   Elevated alkaline phosphatase level 07/17/2015   Elevated serum glutamic pyruvic transaminase (SGPT) level 07/17/2015   Abnormal finding on EKG 07/11/2015   Morbid obesity ([place]) 07/11/2015   Colon, diverticulosis 07/11/2015   Abdominal wall hernia 07/11/2015  DM (diabetes mellitus), type 2 with neurological complications ([place])    Hyperlipidemia     PCP: Maryland Pink, MD  REFERRING PROVIDER: Maryland Pink, MD & Scarlett Presto, [place]  REFERRING DIAG: [phone] (ICD-10-CM) - Cerebral infarction due to unspecified occlusion or stenosis of unspecified precerebral arteries, Cervicalgia with degenerative disc  THERAPY DIAG:  Cervical pain  Painful cervical ROM  Rationale  for Evaluation and Treatment: Rehabilitation  ONSET DATE: 09/03/22  SUBJECTIVE:                                                                                                                                                                                                         SUBJECTIVE STATEMENT: Pt reports that she started having neck pain the day she got out of the hospital.  Pt states that she was in the floor before going to the hospital, but unsure of any mechanism of injury.    PERTINENT HISTORY:   Per chart and confirmed by pt: Pt is a 77 yo female with RMCA CVA infarction with hemorrhagic transformation 08/01/2021, with inpatient rehab 08/07/2021-08/22/2021 followed by [facility] therapy until June. Pt now currently being seen for outpatient OT and ST services. Pt reports limitations in stamina persist. Other PMH is significant for HTN, HLD, macular degeneration, DM, obesity, sleep apnea, arthritis, chronic R hip pain, vitamin D deficiency, diastasis recti, abdominal wall hernia, hx of abdominal hysterectomy, appendectomy, spine surgery (years ago).   PAIN:  Are you having pain? Yes: NPRS scale: 4/10 Pain location: L shoulder/neck area posteriorly Pain description: dull ache Aggravating factors: sitting Relieving factors: laying down  PRECAUTIONS: None  WEIGHT BEARING RESTRICTIONS: No  FALLS:  Has patient fallen in last 6 months? Yes. Number of falls 2  LIVING ENVIRONMENT: Lives with: lives with their son Lives in: House/apartment Stairs: Yes: Internal: 13 steps; doesn't go up to the second floor and External: 4 steps; can reach both Has following equipment at home: Single point cane and Walker - 2 wheeled  OCCUPATION: Retired  PLOF: Independent with basic ADLs  PATIENT GOALS: "To make my neck quit hurting and to prevent it from getting worse"  NEXT MD VISIT: Alyssa Mayo, but maybe May  OBJECTIVE:   DIAGNOSTIC FINDINGS:  No imaging done on the neck.  PATIENT SURVEYS:  NDI  15/50 = 30% disability FOTO 65/68  COGNITION: Overall cognitive status: History of cognitive impairments - at baseline  SENSATION: WFL  POSTURE: rounded shoulders and forward head  PALPATION:     CERVICAL ROM:   Active ROM A/PROM (deg) eval  Flexion 39  Extension 10  Right lateral flexion 7  Left lateral flexion 6  Right rotation 49  Left rotation 30   (Blank rows = not tested)  UPPER EXTREMITY ROM:  Active ROM Right eval Left eval  Shoulder flexion 128 81  Shoulder abduction 160 68  Shoulder adduction    Shoulder extension    Shoulder internal rotation    Shoulder external rotation    Elbow flexion    Elbow extension    Wrist flexion    Wrist extension    Wrist ulnar deviation    Wrist radial deviation    Wrist pronation    Wrist supination     (Blank rows = not tested)  UPPER EXTREMITY MMT:  MMT Right eval Left eval  Shoulder flexion    Shoulder extension    Shoulder abduction    Shoulder adduction    Shoulder extension    Shoulder internal rotation    Shoulder external rotation    Middle trapezius    Lower trapezius    Elbow flexion    Elbow extension    Wrist flexion    Wrist extension    Wrist ulnar deviation    Wrist radial deviation    Wrist pronation    Wrist supination    Grip strength     (Blank rows = not tested)  CERVICAL SPECIAL TESTS:  Spurling's test: Negative and Sharp pursor's test: Negative  FUNCTIONAL TESTS:  Not performed at this time.  TODAY'S TREATMENT: DATE: 09/16/22   Eval only    PATIENT EDUCATION:  Education details: Pt educated on role of PT and services provided during current POC, along with prognosis and information about the clinic. Person educated: Patient and Son, [name] Education method: Explanation Education comprehension: verbalized understanding  HOME EXERCISE PROGRAM:  Not given at initial evaluation  ASSESSMENT:  CLINICAL IMPRESSION: Patient is a 77 y.o. female who was seen today  for physical therapy evaluation and treatment for neck pain.  Pt was seen in this clinic prior to this episode of care for balance related taks and made significant recovery in that aspect.  Pt reports since having Covid and being hospitalized, she has been in much more pain in the neck and is seeking therapy for relief.  Pt is very tender to palpation and may benefit greatly from dry needling in order to address the areas.  Pt presents with physical impairments of decreased activity tolerance, decreased ROM of cervical spine, and increased pain in neck/L shoulder as noted above.  Pt will benefit from skilled therapy to address tolerance, ROM, and pain impairments necessary for improvement in quality of life.  Pt. demonstrates understanding of this plan of care and agrees with this plan.    Pt also educated on dry needling and the potential benefit of TDN. Reviewed precautions and risks with patient. Reviewed special precautions/risks over lung fields which include pneumothorax. Reviewed signs and symptoms of pneumothorax and advised pt to go to ER immediately if these symptoms develop advise them of dry needling treatment. Extensive time spent with pt to ensure full understanding of TDN risks. Dry needling not performed today, but will likely assess pt for treatment technique at next session.    OBJECTIVE IMPAIRMENTS: decreased activity tolerance, decreased cognition, decreased ROM, decreased strength, decreased safety awareness, hypomobility, and pain.   ACTIVITY LIMITATIONS: carrying, lifting, bending, sitting, standing, squatting, and reach over head  PARTICIPATION LIMITATIONS: meal prep, cleaning, laundry, driving, shopping, community activity, and yard work  PERSONAL FACTORS: Age, Behavior  pattern, Past/current experiences, Time since onset of injury/illness/exacerbation, and 3+ comorbidities: arthritis, DM2, HTN, Obesity,   are also affecting patient's functional outcome.   REHAB POTENTIAL:  Good  CLINICAL DECISION MAKING: Stable/uncomplicated  EVALUATION COMPLEXITY: Low   GOALS: Goals reviewed with patient? Yes  SHORT TERM GOALS: Target date: 10/14/2022  Pt will be independent with HEP in order to improve strength of UE's and ROM of the neck in order to return to PLOF. Baseline: Not given at initial evaluation. Goal status: INITIAL   LONG TERM GOALS: Target date: 12/09/2022   Patient will demonstrate improved function as evidenced by a score of 68 on FOTO measure for full participation in activities at home and in the community. Baseline: 65 Goal status: INITIAL  2.  Pt to improve NDI by 5 points or 10% in order to demonstrate a reduction in overall disability caused by neck pain at this time. Baseline: 15/50 or 30% disability Goal status: INITIAL  3.  Pt to demonstrate improved cervical rotation to 60 deg bilaterally in order to perform tasks around the house that require turning of the head. Baseline: R/L Cervical Rotation: 49/30 Goal status: INITIAL  4.  Pt to report 0/10 pain in the neck following light household chores at home in order to demonstrate an overall reduction in pain and an improvement in QoL. Baseline: 4/10 at rest Goal status: INITIAL   PLAN:  PT FREQUENCY: 1-2x/week  PT DURATION: 12 weeks  PLANNED INTERVENTIONS: Therapeutic exercises, Therapeutic activity, Neuromuscular re-education, Balance training, Gait training, Patient/Family education, Self Care, Joint mobilization, Joint manipulation, Vestibular training, Canalith repositioning, Visual/preceptual remediation/compensation, DME instructions, Dry Needling, Cognitive remediation, Spinal manipulation, Spinal mobilization, Cryotherapy, Moist heat, Traction, Manual therapy, and Re-evaluation    PLAN FOR NEXT SESSION:   Manual therapy for neck and improve ROM, dry needling if possible.    Gwenlyn Saran, PT, DPT Physical Therapist - [facility]   09/16/22, 4:41 PM

## 2022-09-18 ENCOUNTER — Encounter: Payer: Medicare HMO | Admitting: Speech Pathology

## 2022-09-18 ENCOUNTER — Ambulatory Visit: Payer: Medicare HMO

## 2022-09-18 ENCOUNTER — Ambulatory Visit: Payer: Medicare HMO | Admitting: Occupational Therapy

## 2022-09-18 DIAGNOSIS — M6281 Muscle weakness (generalized): Secondary | ICD-10-CM | POA: Diagnosis not present

## 2022-09-18 DIAGNOSIS — R278 Other lack of coordination: Secondary | ICD-10-CM

## 2022-09-18 DIAGNOSIS — M542 Cervicalgia: Secondary | ICD-10-CM

## 2022-09-18 NOTE — Therapy (Signed)
OUTPATIENT OCCUPATIONAL THERAPY TREATMENT NOTE   Patient Name: Alyssa Mayo MRN: [id number] DOB:[birth year], 77 y.o., female Today's Date: 03/14/2022   PCP: [place] Pink, MD REFERRING PROVIDER: Frann Rider, NP     OT End of Session - 09/18/22 1138     Visit Number 2    Number of Visits 24    Date for OT Re-Evaluation 12/09/22    Authorization Type Progress reporting period starting 09/16/2022    OT Start Time 1105    OT Stop Time 1145    OT Time Calculation (min) 40 min    Activity Tolerance Patient tolerated treatment well    Behavior During Therapy [facility] for tasks assessed/performed                                    Past Medical History:  Diagnosis Date   Abnormal levels of other serum enzymes 07/17/2015   Arthritis     Constipation 07/11/2015   COVID-19 03/06/2021   Diabetes mellitus without complication ([place])     Elevated liver enzymes     Fatty liver     Generalized abdominal pain 07/11/2015   Hyperlipidemia     Hypertension     Lifelong obesity     Macular degeneration     Morbid obesity due to excess calories ([place]) 07/11/2015   Other fatigue 07/11/2015   Sleep apnea     Spinal headache           Past Surgical History:  Procedure Laterality Date   ABDOMINAL HYSTERECTOMY       APPENDECTOMY       CATARACT EXTRACTION       CERVICAL LAMINECTOMY   07/21/1985   CESAREAN SECTION       COLONOSCOPY   07/21/2012    diverticulosis, [facility] Dr. Donnella Sham    COLONOSCOPY WITH PROPOFOL N/A 02/04/2019    Procedure: COLONOSCOPY WITH PROPOFOL;  Surgeon: Lollie Sails, MD;  Location: [facility] ENDOSCOPY;  Service: Endoscopy;  Laterality: N/A;   COLONOSCOPY WITH PROPOFOL N/A 03/18/2021    Procedure: COLONOSCOPY WITH PROPOFOL;  Surgeon: Lesly Rubenstein, MD;  Location: [facility] ENDOSCOPY;  Service: Endoscopy;  Laterality: N/A;  DM   DIAGNOSTIC LAPAROSCOPY       ESOPHAGOGASTRODUODENOSCOPY (EGD) WITH PROPOFOL N/A 02/04/2019    Procedure: ESOPHAGOGASTRODUODENOSCOPY  (EGD) WITH PROPOFOL;  Surgeon: Lollie Sails, MD;  Location: [facility] ENDOSCOPY;  Service: Endoscopy;  Laterality: N/A;   ESOPHAGOGASTRODUODENOSCOPY (EGD) WITH PROPOFOL N/A 03/18/2021    Procedure: ESOPHAGOGASTRODUODENOSCOPY (EGD) WITH PROPOFOL;  Surgeon: Lesly Rubenstein, MD;  Location: [facility] ENDOSCOPY;  Service: Endoscopy;  Laterality: N/A;   LOOP RECORDER INSERTION N/A 08/05/2021    Procedure: LOOP RECORDER INSERTION;  Surgeon: Evans Lance, MD;  Location: 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/07/2021   CVA (cerebral vascular accident) ([place]) 08/01/2021   GERD (gastroesophageal reflux disease) 07/20/2019   Advanced care planning/counseling discussion 12/17/2016   Skin lesions, generalized 11/13/2016   Chronic right hip pain 06/17/2016  Vitamin D deficiency 09/26/2015   Diastasis recti 08/08/2015   Elevated serum GGT level 07/24/2015   Essential hypertension 07/17/2015   Fatty liver 07/17/2015   Elevated alkaline phosphatase level 07/17/2015   Elevated serum glutamic pyruvic transaminase (SGPT) level 07/17/2015   Abnormal finding on EKG 07/11/2015   Morbid obesity ([place]) 07/11/2015   Colon, diverticulosis 07/11/2015   Abdominal wall hernia 07/11/2015   DM (diabetes mellitus), type 2 with neurological complications ([place])     Hyperlipidemia        ONSET DATE: 08/01/2021   REFERRING DIAG: CVA   THERAPY DIAG:  Muscle weakness (generalized)   Rationale for Evaluation and Treatment Rehabilitation   SUBJECTIVE:    SUBJECTIVE STATEMENT:    Pt. reports that COVID-19 really made her weak.    Pt  accompanied by: self   PERTINENT HISTORY:  Pt. is a 77 y.o. female who had a unexpectedly hospitalized from 2/12-2/14/2024 for COVID-19, and a UTI.  Pt. 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 Pt. attended inpatient rehab from 08/07/2021-08/22/2021. Pt. received Home health therapy services this past spring.  Pt. has recently had an assessment through driver rehabilitation services at [facility] with recommendations for referrals for  outpatient OT/PT, and ST services. Pt. PMHx includes: HTN, Hyperlipidemia, Macular degeneration, DM, and Obesity.   PRECAUTIONS: None   WEIGHT BEARING RESTRICTIONS No   PAIN:  Are you having pain? 4/10 left neck   FALLS: Has patient fallen in last 6 months? Yes.   LIVING ENVIRONMENT: Lives with: lives with their family  Son  Marya Mayo Lives in: House/apartment Main living are on one floor Stairs:  2 steps to enter Has following equipment at home: Single point cane, Wheelchair (manual), shower chair, Grab bars, bed rail, and rubber mat.   PLOF: Independent   PATIENT GOALS: To be able to Drive, and cook again   OBJECTIVE:    HAND DOMINANCE: Right    ADLs:  Transfers/ambulation related to ADLs: Independent Eating: Independent Grooming: [employer] Pt. reports being unable to use her left hand to perform haircare.  UB Dressing: Independent pullover shirt, independent now with fastening a bra LB Dressing: Independent donning pants socks, and slide on shoes Toileting: Independent Bathing: Independent Tub Shower transfers: Walk-in shower Independent now     IADLs: Shopping: Needs to be accompanied to the grocery store. Light housekeeping: Do  Meal Prep:  Is able to perform light meal prep Community mobility: Relies on son, and friend Medication management: Son sets up Building surveyor management:TBD Handwriting: TBD   MOBILITY STATUS: Hx of falls out of bed     ACTIVITY TOLERANCE: Activity  tolerance:  10-20 min. Before rest break   FUNCTIONAL OUTCOME MEASURES: FOTO: 61  TR score: 62   UPPER EXTREMITY ROM      Active ROM Right eval Left eval  Shoulder flexion WFL 54(74) scaption  Shoulder abduction [facility] 58(75)  Shoulder adduction      Shoulder extension      Shoulder internal rotation      Shoulder external rotation      Elbow flexion [facility] WFL  Elbow extension [facility] WFL  Wrist flexion WFL TBD  Wrist extension WFL TBD  Wrist ulnar deviation      Wrist radial deviation      Wrist pronation      Wrist supination      (Blank rows = not tested)     UPPER EXTREMITY MMT:      MMT Right eval  Left eval  Shoulder flexion 4+/5 2+/5  Shoulder abduction 4+/5 2+/5  Shoulder adduction      Shoulder extension      Shoulder internal rotation      Shoulder external rotation      Middle trapezius      Lower trapezius      Elbow flexion 4+/5 3+/5  Elbow extension 4+/5 3+/5  Wrist flexion 4+/5 TBD  Wrist extension 4+/5 TBD  Wrist ulnar deviation      Wrist radial deviation      Wrist pronation      Wrist supination      (Blank rows = not tested)   HAND FUNCTION: Grip strength: Right: 33 lbs; Left: 11 lbs, Lateral pinch: Right: 17 lbs, Left: 2 lbs, and 3 point pinch: Right: 17 lbs, Left: 2 lbs   COORDINATION:   9 Hole Peg test: Right: 27 sec.  sec; Left: 52 sec.   SENSATION: Light touch: WFL Proprioception: WFL   COGNITION: Overall cognitive status: Within functional limits for tasks assessed   VISION: Subjective report: Glasses. Just received a new prescription to increase bifocal strength Baseline vision: Macular Degeneration Visual history: macular degeneration   VISION ASSESSMENT: To be further assessed in functional context  Left sided Awareness       PERCEPTION: Limited left sided awareness     TODAY'S TREATMENT:    Therapeutic Ex:  Pt. performed gross gripping with a gross grip strengthener. Pt. worked on sustaining grip while grasping pegs  and placing them into container placed at the tabletop. The Gripper was set to 6.6# of grip strength resistance. Pt. worked on pinch strengthening in the left hand for lateral, and 3pt. pinch using yellow, red, and green resistive clips. Pt. worked on placing the clips on a horizontal dowel. Tactile and verbal cues were required for eliciting the desired movement. Pt. performed resistive EZ Board exercises for forearm supination/pronation, wrist flexion/extension using gross grasp, and lateral pinch (key) grasp. Pt. performed resistive EZ Board exercises angled in several planes to promote shoulder flexion, abduction, and wrist flexion, and extension while performing resistive wrist flexion and extension with a gross grip.   Neuromuscular re-ed:  Pt. performed [facility] tasks using the Grooved pegboard. Pt. worked on grasping the grooved pegs from a horizontal position, and translatory skills moving the pegs to a vertical position in the hand to prepare for placing them in the grooved slot.    PATIENT EDUCATION: Education details: Compensatory strategies during typing. Person educated: Patient Education method: verbal cues Education comprehension: verbalized understanding and pt does not plan to cook unless son is present.     HOME EXERCISE PROGRAM:  Reviewed home activities to enhance left hand digit extension  during typing skills.       GOALS: GOALS: Goals reviewed with patient? Yes     SHORT TERM GOALS: Target date: 10/28/2022     Pt. Will improve FOTO score by 2 points to reflect improved pt. perceived functional performance Baseline: FOTO: 61 TR score: 62  Goal status: INITIAL   LONG TERM GOALS: Target date: 12/09/2022    Pt. Will improve left UE strength by 2 mm grades to assist with repositioning her grandson on her lap. Baseline: Eval: left shoulder flexion, abduction 2+/5, elbow flexion, and extension 3+/5 Goal status: INITIAL   2.  Pt. will be able to independently hold and use  a blow dryer, and brush for hair care. Baseline: Eval: Pt. Is unable to sustain her BUEs in elevation, and  use a blow dryer, and brush. Goal status: INITIAL   3.  Pt. Will improve left lateral pinch strength by 3# to be able to independently cut meat. Baseline: Eval: 2#. Pt. has difficulty stabilizing utensil, and food while cutting food. Goal status:  INITIAL   4.  Pt. Will improve Left hand [facility] skills to be able to be able to independently, and efficiently manipulate small objects during ADL tasks. Baseline: Eval: Left: 52 sec. Right: 27 sec. Goal status: INITIAL     6.: Pt. will improve with left grip strength to be able to securely hold a drink.              Baseline: Eval:  Left grip strength 11#. Pt. has difficulty holding a full drink  securely with the left hand.             Goal status: INITIAL   7. Pt. Will improve typing speed, and accuracy in preparation for efficiently typing simple email message.             Baseline: Pt. Presents with difficulty typing an email. Typing speed, and accuracy TBD             Goal status: INITIAL                                        CLINICAL IMPRESSION:   Pt. reports feeling very weak with decreased energy, and brain fog since being hospitalized with COVID-19 and  UTI. Pt. required increased time to complete each task, and required cues for visual demonstration of proper technique, and proper movement pattern. Pt. required assist to perform each task. Pt. continues to benefit from OT services in order to work towards improving, and maximizing independence with ADLs, and IADL tasks.    PERFORMANCE DEFICITS in functional skills including ADLs, IADLs, coordination, dexterity, sensation, ROM, strength, FMC, decreased knowledge of use of DME, vision, and UE functional use, cognitive skills including attention, and psychosocial skills including coping strategies, environmental adaptation, habits, and routines and behaviors.    IMPAIRMENTS are  limiting patient from ADLs, IADLs, and social participation.    COMORBIDITIES may have co-morbidities  that affects occupational performance. Patient will benefit from skilled OT to address above impairments and improve overall function.   MODIFICATION OR ASSISTANCE TO COMPLETE EVALUATION: Min-Moderate modification of tasks or assist with assess necessary to complete an evaluation.   OT OCCUPATIONAL PROFILE AND HISTORY: Detailed assessment: Review of records and additional review of physical, cognitive, psychosocial history related to current functional performance.   CLINICAL DECISION MAKING: Moderate - several treatment options, min-mod task modification necessary   REHAB POTENTIAL: Good   EVALUATION COMPLEXITY: Moderate      PLAN: OT FREQUENCY: 2x/week   OT DURATION: 12 weeks   PLANNED INTERVENTIONS: self care/ADL training, therapeutic exercise, therapeutic activity, neuromuscular re-education, manual therapy, passive range of motion, and paraffin   RECOMMENDED OTHER SERVICES: ST, and PT   CONSULTED AND AGREED WITH PLAN OF CARE: Patient   PLAN FOR NEXT SESSION:  L hand strengthening and coordination exercises   Harrel Carina, MS, OTR/L

## 2022-09-18 NOTE — Therapy (Signed)
OUTPATIENT PHYSICAL THERAPY CERVICAL TREATMENT   Patient Name: Alyssa Mayo MRN: [id number] DOB:11-05-1945, 77 y.o., female Today's Date: 09/18/2022  END OF SESSION:   PT End of Session - 09/18/22 1021     Visit Number 2    Number of Visits 12    Date for PT Re-Evaluation 12/09/22    Progress Note Due on Visit 10    PT Start Time 1017    PT Stop Time 1100    PT Time Calculation (min) 43 min              Past Medical History:  Diagnosis Date   Abnormal levels of other serum enzymes 07/17/2015   Arthritis    Constipation 07/11/2015   COVID-19 03/06/2021   Diabetes mellitus without complication ([place])    Elevated liver enzymes    Fatty liver    Generalized abdominal pain 07/11/2015   Hyperlipidemia    Hypertension    Lifelong obesity    Macular degeneration    Morbid obesity due to excess calories ([place]) 07/11/2015   Other fatigue 07/11/2015   Sleep apnea    Spinal headache    Past Surgical History:  Procedure Laterality Date   ABDOMINAL HYSTERECTOMY     APPENDECTOMY     CATARACT EXTRACTION     CERVICAL LAMINECTOMY  07/21/1985   CESAREAN SECTION     COLONOSCOPY  07/21/2012   diverticulosis, [facility] Dr. Donnella Sham    COLONOSCOPY WITH PROPOFOL N/A 02/04/2019   Procedure: COLONOSCOPY WITH PROPOFOL;  Surgeon: Lollie Sails, MD;  Location: [facility] ENDOSCOPY;  Service: Endoscopy;  Laterality: N/A;   COLONOSCOPY WITH PROPOFOL N/A 03/18/2021   Procedure: COLONOSCOPY WITH PROPOFOL;  Surgeon: Lesly Rubenstein, MD;  Location: [facility] ENDOSCOPY;  Service: Endoscopy;  Laterality: N/A;  DM   DIAGNOSTIC LAPAROSCOPY     ESOPHAGOGASTRODUODENOSCOPY (EGD) WITH PROPOFOL N/A 02/04/2019   Procedure: ESOPHAGOGASTRODUODENOSCOPY (EGD) WITH PROPOFOL;  Surgeon: Lollie Sails, MD;  Location: [facility] ENDOSCOPY;  Service: Endoscopy;  Laterality: N/A;   ESOPHAGOGASTRODUODENOSCOPY (EGD) WITH PROPOFOL N/A 03/18/2021   Procedure: ESOPHAGOGASTRODUODENOSCOPY (EGD) WITH PROPOFOL;  Surgeon:  Lesly Rubenstein, MD;  Location: [facility] ENDOSCOPY;  Service: Endoscopy;  Laterality: N/A;   LOOP RECORDER INSERTION N/A 08/05/2021   Procedure: LOOP RECORDER INSERTION;  Surgeon: Evans Lance, MD;  Location: [place] CV LAB;  Service: Cardiovascular;  Laterality: N/A;   [place]     TUBAL LIGATION     Patient Active Problem List   Diagnosis Date Noted   UTI (urinary tract infection) 09/01/2022   COVID-19 virus infection 09/01/2022   Chronic diastolic CHF (congestive heart failure) ([place]) 09/01/2022   Chronic kidney disease, stage 3a ([place]) 09/01/2022   Obesity (BMI 30-39.9) 09/01/2022   Paroxysmal atrial fibrillation ([place]) 08/08/2022   Hypercoagulable state due to paroxysmal atrial fibrillation ([place]) 08/08/2022   Cerebral edema ([place]) 08/07/2021   OSA (obstructive sleep apnea) 08/07/2021   Right middle cerebral artery stroke ([place]) 08/07/2021   CVA (cerebral vascular accident) ([place]) 08/01/2021   GERD (gastroesophageal reflux disease) 07/20/2019   Advanced care planning/counseling discussion 12/17/2016   Skin lesions, generalized 11/13/2016   Chronic right hip pain 06/17/2016   Vitamin D deficiency 09/26/2015   Diastasis recti 08/08/2015   Elevated serum GGT level 07/24/2015   Essential hypertension 07/17/2015   Fatty liver 07/17/2015   Elevated alkaline phosphatase level 07/17/2015   Elevated serum glutamic pyruvic transaminase (SGPT) level 07/17/2015   Abnormal finding on EKG 07/11/2015   Morbid obesity ([place]) 07/11/2015  Colon, diverticulosis 07/11/2015   Abdominal wall hernia 07/11/2015   DM (diabetes mellitus), type 2 with neurological complications ([place])    Hyperlipidemia     PCP: Maryland Pink, MD  REFERRING PROVIDER: Maryland Pink, MD & Scarlett Presto, [place]  REFERRING DIAG: [phone] (ICD-10-CM) - Cerebral infarction due to unspecified occlusion or stenosis of unspecified precerebral arteries, Cervicalgia with degenerative disc  THERAPY DIAG:  Muscle  weakness (generalized)  Cervical pain  Painful cervical ROM  Rationale for Evaluation and Treatment: Rehabilitation  ONSET DATE: 09/03/22  SUBJECTIVE:                                                                                                                                                                                                         SUBJECTIVE STATEMENT:  Pt reports she is having 1/10 pain in the L shoulder/neck region.  Pt states she has still not been active since initial evaluation.  PERTINENT HISTORY:   Per chart and confirmed by pt: Pt is a 77 yo female with RMCA CVA infarction with hemorrhagic transformation 08/01/2021, with inpatient rehab 08/07/2021-08/22/2021 followed by [facility] therapy until June. Pt now currently being seen for outpatient OT and ST services. Pt reports limitations in stamina persist. Other PMH is significant for HTN, HLD, macular degeneration, DM, obesity, sleep apnea, arthritis, chronic R hip pain, vitamin D deficiency, diastasis recti, abdominal wall hernia, hx of abdominal hysterectomy, appendectomy, spine surgery (years ago).   PAIN:  Are you having pain? Yes: NPRS scale: 4/10 Pain location: L shoulder/neck area posteriorly Pain description: dull ache Aggravating factors: sitting Relieving factors: laying down  PRECAUTIONS: None  WEIGHT BEARING RESTRICTIONS: No  FALLS:  Has patient fallen in last 6 months? Yes. Number of falls 2  LIVING ENVIRONMENT: Lives with: lives with their son Lives in: House/apartment Stairs: Yes: Internal: 13 steps; doesn't go up to the second floor and External: 4 steps; can reach both Has following equipment at home: Single point cane and Walker - 2 wheeled  OCCUPATION: Retired  PLOF: Independent with basic ADLs  PATIENT GOALS: "To make my neck quit hurting and to prevent it from getting worse"  NEXT MD VISIT: Marena Chancy, but maybe May  OBJECTIVE:   DIAGNOSTIC FINDINGS:  No imaging done on the  neck.  PATIENT SURVEYS:  NDI 15/50 = 30% disability FOTO 65/68  COGNITION: Overall cognitive status: History of cognitive impairments - at baseline  SENSATION: WFL  POSTURE: rounded shoulders and forward head  PALPATION:     CERVICAL ROM:   Active ROM A/PROM (deg) eval  Flexion  39  Extension 10  Right lateral flexion 7  Left lateral flexion 6  Right rotation 49  Left rotation 30   (Blank rows = not tested)  UPPER EXTREMITY ROM:  Active ROM Right eval Left eval  Shoulder flexion 128 81  Shoulder abduction 160 68   (Blank rows = not tested)   UPPER EXTREMITY MMT:  Grossly decreased (3+/5) in the L UE due to pain and lack of ROM  (Blank rows = not tested)  CERVICAL SPECIAL TESTS:  Spurling's test: Negative and Sharp pursor's test: Negative  FUNCTIONAL TESTS:  Not performed at this time.  TODAY'S TREATMENT: DATE: 09/18/22    Manual:  Supine STM to cervical region to increase extensibility of the paraspinals Supine suboccipital release technique to decrease cervicalgia Supine manual traction performed in order to increase joint space in cervical region for pain relief Supine cervical upglides/downglides, 30 sec bouts Supine UT/Levator stretch, 30 sec bouts to increase tissue extensibility of the cervical region     Trigger Point Dry Needling (TDN), unbilled Education performed with patient regarding potential benefit of TDN. Reviewed precautions and risks with patient. Reviewed special precautions/risks over lung fields which include pneumothorax.  Reviewed signs and symptoms of pneumothorax and advised pt to go to ER immediately if these symptoms develop advise them of dry needling treatment.  Extensive time spent with pt to ensure full understanding of TDN risks. Pt provided verbal consent to treatment. TDN performed to L UT with 0.30 x 30 single needle placements with local twitch response (LTR). Pistoning technique utilized. Improved pain-free motion  following intervention.      PATIENT EDUCATION:  Education details: Pt educated on role of PT and services provided during current POC, along with prognosis and information about the clinic. Person educated: Patient and Son, [name] Education method: Explanation Education comprehension: verbalized understanding  HOME EXERCISE PROGRAM:  Access Code: [facility] URL: [email or web address] Date: 09/18/2022 Prepared by: Langston Nation  Exercises - Seated Upper Trapezius Stretch  - 1 x daily - 7 x weekly - 3 sets - 10 reps - Seated Levator Scapulae Stretch  - 1 x daily - 7 x weekly - 3 sets - 10 reps - Seated Scapular Retraction  - 1 x daily - 7 x weekly - 3 sets - 10 reps  ASSESSMENT:  CLINICAL IMPRESSION:  Pt responded well to the manual therapy, noting a reduction in her overall painful symptoms as well as an increase in AROM.  Pt self-reported the manual traction to be beneficial and reported that to reduce her pain the most of the manual tractions.  Dry needling performing due to pt's inability to tolerate TP release technique and STM.  Pt responded well to the dry needling with no adverse reactions and several twitches during needling.  Pt will continue to benefit from skilled therapy to address remaining deficits in order to improve overall QoL and return to PLOF.      OBJECTIVE IMPAIRMENTS: decreased activity tolerance, decreased cognition, decreased ROM, decreased strength, decreased safety awareness, hypomobility, and pain.   ACTIVITY LIMITATIONS: carrying, lifting, bending, sitting, standing, squatting, and reach over head  PARTICIPATION LIMITATIONS: meal prep, cleaning, laundry, driving, shopping, community activity, and yard work  PERSONAL FACTORS: Age, Behavior pattern, Past/current experiences, Time since onset of injury/illness/exacerbation, and 3+ comorbidities: arthritis, DM2, HTN, Obesity,   are also affecting patient's functional outcome.   REHAB POTENTIAL:  Good  CLINICAL DECISION MAKING: Stable/uncomplicated  EVALUATION COMPLEXITY: Low   GOALS: Goals reviewed with patient? Yes  SHORT TERM GOALS: Target date: 10/14/2022  Pt will be independent with HEP in order to improve strength of UE's and ROM of the neck in order to return to PLOF. Baseline: Not given at initial evaluation. Goal status: INITIAL    LONG TERM GOALS: Target date: 12/09/2022  Patient will demonstrate improved function as evidenced by a score of 68 on FOTO measure for full participation in activities at home and in the community. Baseline: 65 Goal status: INITIAL  2.  Pt to improve NDI by 5 points or 10% in order to demonstrate a reduction in overall disability caused by neck pain at this time. Baseline: 15/50 or 30% disability Goal status: INITIAL  3.  Pt to demonstrate improved cervical rotation to 60 deg bilaterally in order to perform tasks around the house that require turning of the head. Baseline: R/L Cervical Rotation: 49/30 Goal status: INITIAL  4.  Pt to report 0/10 pain in the neck following light household chores at home in order to demonstrate an overall reduction in pain and an improvement in QoL. Baseline: 4/10 at rest Goal status: INITIAL   PLAN:  PT FREQUENCY: 1-2x/week  PT DURATION: 12 weeks  PLANNED INTERVENTIONS: Therapeutic exercises, Therapeutic activity, Neuromuscular re-education, Balance training, Gait training, Patient/Family education, Self Care, Joint mobilization, Joint manipulation, Vestibular training, Canalith repositioning, Visual/preceptual remediation/compensation, DME instructions, Dry Needling, Cognitive remediation, Spinal manipulation, Spinal mobilization, Cryotherapy, Moist heat, Traction, Manual therapy, and Re-evaluation    PLAN FOR NEXT SESSION:   Manual therapy for neck and improve ROM, dry needling if tolerable.    Gwenlyn Saran, PT, DPT Physical Therapist - [facility]   09/18/22, 10:22 AM

## 2022-09-19 ENCOUNTER — Ambulatory Visit (HOSPITAL_COMMUNITY)
Admission: RE | Admit: 2022-09-19 | Discharge: 2022-09-19 | Disposition: A | Discharge status: Home / Self Care | Payer: Medicare HMO | Source: Ambulatory Visit | Attending: Physician Assistant | Admitting: Physician Assistant

## 2022-09-19 ENCOUNTER — Encounter (HOSPITAL_COMMUNITY): Payer: Self-pay | Admitting: Physician Assistant

## 2022-09-19 VITALS — BP 122/60 | HR 93 | Ht 64.0 in | Wt 187.6 lb

## 2022-09-19 DIAGNOSIS — E785 Hyperlipidemia, unspecified: Secondary | ICD-10-CM | POA: Insufficient documentation

## 2022-09-19 DIAGNOSIS — D6869 Other thrombophilia: Secondary | ICD-10-CM | POA: Insufficient documentation

## 2022-09-19 DIAGNOSIS — E119 Type 2 diabetes mellitus without complications: Secondary | ICD-10-CM | POA: Insufficient documentation

## 2022-09-19 DIAGNOSIS — Z7901 Long term (current) use of anticoagulants: Secondary | ICD-10-CM | POA: Insufficient documentation

## 2022-09-19 DIAGNOSIS — I10 Essential (primary) hypertension: Secondary | ICD-10-CM | POA: Diagnosis not present

## 2022-09-19 DIAGNOSIS — G4733 Obstructive sleep apnea (adult) (pediatric): Secondary | ICD-10-CM | POA: Insufficient documentation

## 2022-09-19 DIAGNOSIS — I480 Paroxysmal atrial fibrillation: Secondary | ICD-10-CM | POA: Diagnosis present

## 2022-09-19 NOTE — Progress Notes (Signed)
Primary Care Physician: Maryland Pink, MD Primary Cardiologist: Dr Fletcher Anon (remotely) Primary Electrophysiologist: Dr Lovena Le Referring Physician: Dr [facility]   Alyssa Mayo is a 77 y.o. female with a history of DM, HTN, HLD, OSA, atrial fibrillation who presents for follow up in the [facility].  The patient was initially diagnosed with atrial fibrillation 06/2022 on her ILR which was placed 07/2021 for cryptogenic stroke. The episode lasted 3-4 hours. She did feel palpitations in her back during that time. Patient has a CHADS2VASC score of 7. She denies significant alcohol use and is compliant with her CPAP therapy.   On follow up today, patient reports that she has done well from a cardiac standpoint. Her ILR has not shown any interim episodes of afib. No bleeding issues on anticoagulation. She was hospitalized 08/2022 with COVID, now recovered.   Today, she denies symptoms of palpitations, chest pain, shortness of breath, orthopnea, PND, lower extremity edema, dizziness, presyncope, syncope, snoring, daytime somnolence, bleeding. The patient is tolerating medications without difficulties and is otherwise without complaint today.    Atrial Fibrillation Risk Factors:  she does have symptoms or diagnosis of sleep apnea. she is compliant with CPAP therapy. she does not have a history of rheumatic fever. she does not have a history of alcohol use. The patient does not have a history of early familial atrial fibrillation or other arrhythmias.  she has a BMI of Body mass index is 32.2 kg/m.Marland Kitchen Filed Weights   09/19/22 0953  Weight: 85.1 kg    Family History  Problem Relation Age of Onset   Cancer Mother        breast   Heart disease Mother    Stroke Mother    Diabetes Mother    Colon cancer Mother    Cancer Father        leukemia   Stroke Father    Leukemia Father    Ovarian cancer Sister    Diabetes Brother    Cancer Brother     Stroke Maternal Grandfather    Dementia Paternal Grandmother    COPD Neg Hx    Hypertension Neg Hx      Atrial Fibrillation Management history:  Previous antiarrhythmic drugs: none Previous cardioversions: none Previous ablations: none CHADS2VASC score: 7 Anticoagulation history: Eliquis   Past Medical History:  Diagnosis Date   Abnormal levels of other serum enzymes 07/17/2015   Arthritis    Constipation 07/11/2015   COVID-19 03/06/2021   Diabetes mellitus without complication ([place])    Elevated liver enzymes    Fatty liver    Generalized abdominal pain 07/11/2015   Hyperlipidemia    Hypertension    Lifelong obesity    Macular degeneration    Morbid obesity due to excess calories ([place]) 07/11/2015   Other fatigue 07/11/2015   Sleep apnea    Spinal headache    Past Surgical History:  Procedure Laterality Date   ABDOMINAL HYSTERECTOMY     APPENDECTOMY     CATARACT EXTRACTION     CERVICAL LAMINECTOMY  07/21/1985   CESAREAN SECTION     COLONOSCOPY  07/21/2012   diverticulosis, [facility] Dr. Donnella Sham    COLONOSCOPY WITH PROPOFOL N/A 02/04/2019   Procedure: COLONOSCOPY WITH PROPOFOL;  Surgeon: Lollie Sails, MD;  Location: [facility] ENDOSCOPY;  Service: Endoscopy;  Laterality: N/A;   COLONOSCOPY WITH PROPOFOL N/A 03/18/2021   Procedure: COLONOSCOPY WITH PROPOFOL;  Surgeon: Lesly Rubenstein, MD;  Location: [facility] ENDOSCOPY;  Service: Endoscopy;  Laterality: N/A;  DM   DIAGNOSTIC LAPAROSCOPY     ESOPHAGOGASTRODUODENOSCOPY (EGD) WITH PROPOFOL N/A 02/04/2019   Procedure: ESOPHAGOGASTRODUODENOSCOPY (EGD) WITH PROPOFOL;  Surgeon: Lollie Sails, MD;  Location: [facility] ENDOSCOPY;  Service: Endoscopy;  Laterality: N/A;   ESOPHAGOGASTRODUODENOSCOPY (EGD) WITH PROPOFOL N/A 03/18/2021   Procedure: ESOPHAGOGASTRODUODENOSCOPY (EGD) WITH PROPOFOL;  Surgeon: Lesly Rubenstein, MD;  Location: [facility] ENDOSCOPY;  Service: Endoscopy;  Laterality: N/A;   LOOP RECORDER INSERTION N/A 08/05/2021    Procedure: LOOP RECORDER INSERTION;  Surgeon: Evans Lance, MD;  Location: [place] CV LAB;  Service: Cardiovascular;  Laterality: N/A;   SPINE SURGERY     TUBAL LIGATION      Current Outpatient Medications  Medication Sig Dispense Refill   acetaminophen (TYLENOL) 325 MG tablet Take 2 tablets (650 mg total) by mouth every 4 (four) hours as needed for mild pain (or temp > 37.5 C (99.5 F)).     amLODipine (NORVASC) 2.5 MG tablet Take 1 tablet (2.5 mg total) by mouth daily. 30 tablet 0   apixaban (ELIQUIS) 5 MG TABS tablet Take 1 tablet (5 mg total) by mouth 2 (two) times daily. 60 tablet 3   celecoxib (CELEBREX) 200 MG capsule Take 200 mg by mouth 2 (two) times daily.     dapagliflozin propanediol (FARXIGA) 10 MG TABS tablet Take 10 mg by mouth daily. 90 tablet 1   gabapentin (NEURONTIN) 100 MG capsule Take 200 mg by mouth at bedtime.     glipiZIDE (GLUCOTROL XL) 5 MG 24 hr tablet Take 1 tablet (5 mg total) by mouth 2 (two) times daily with a meal. 60 tablet 0   magnesium oxide (MAG-OX) 400 MG tablet Take 1 tablet (400 mg total) by mouth at bedtime. 30 tablet 0   [START ON 09/25/2022] memantine (NAMENDA) 10 MG tablet Take 1 tablet (10 mg total) by mouth 2 (two) times daily. 60 tablet 5   metFORMIN (GLUCOPHAGE) 500 MG tablet Take 2 tablets (1,000 mg total) by mouth 2 (two) times daily. 120 tablet 0   Multiple Vitamins-Minerals (PRESERVISION AREDS 2) CAPS Take 1 capsule by mouth 2 (two) times daily.     pantoprazole (PROTONIX) 40 MG tablet Take 1 tablet (40 mg total) by mouth daily. 30 tablet 0   simvastatin (ZOCOR) 40 MG tablet      Vitamin D, Ergocalciferol, (DRISDOL) 1.25 MG (50000 UNIT) CAPS capsule Take 1 capsule (50,000 Units total) by mouth every 7 (seven) days. 5 capsule 0   No current facility-administered medications for this encounter.    Allergies  Allergen Reactions   Codeine Other (See Comments)    Makes her very loopy and groggy   Neosporin [Neomycin-Bacitracin Zn-Polymyx]  Hives   Ozempic [Semaglutide] Hives   Polymyxin B Other (See Comments)    Eyes redness   Sulfa Antibiotics Itching    Social History   Socioeconomic History   Marital status: Widowed    Spouse name: Not on file   Number of children: Not on file   Years of education: Not on file   Highest education level: Bachelor's degree (e.g., BA, AB, BS)  Occupational History   Not on file  Tobacco Use   Smoking status: Never   Smokeless tobacco: Never   Tobacco comments:    Never smoke 08/08/22  Vaping Use   Vaping Use: Never used  Substance and Sexual Activity   Alcohol use: Not Currently    Comment: none last 24 months   Drug use: No  Sexual activity: Not on file  Other Topics Concern   Not on file  Social History Narrative   Not on file   Social Determinants of Health   Financial Resource Strain: Low Risk  (12/10/2017)   Overall Financial Resource Strain (CARDIA)    Difficulty of Paying Living Expenses: Not hard at all  Food Insecurity: No Food Insecurity (09/01/2022)   Hunger Vital Sign    Worried About Running Out of Food in the Last Year: Never true    Ran Out of Food in the Last Year: Never true  Transportation Needs: No Transportation Needs (09/01/2022)   PRAPARE - Hydrologist (Medical): No    Lack of Transportation (Non-Medical): No  Physical Activity: Inactive (12/10/2017)   Exercise Vital Sign    Days of Exercise per Week: 0 days    Minutes of Exercise per Session: 0 min  Stress: No Stress Concern Present (12/10/2017)   [place]    Feeling of Stress : Not at all  Social Connections: [place] (12/10/2017)   Social Connection and Isolation Panel [NHANES]    Frequency of Communication with Friends and Family: More than three times a week    Frequency of Social Gatherings with Friends and Family: More than three times a week    Attends Religious Services: More  than 4 times per year    Active Member of [employer] or Organizations: Yes    Attends Music therapist: More than 4 times per year    Marital Status: Married  Human resources officer Violence: Not At Risk (09/01/2022)   Humiliation, Afraid, Rape, and Kick questionnaire    Fear of Current or Ex-Partner: No    Emotionally Abused: No    Physically Abused: No    Sexually Abused: No     ROS- All systems are reviewed and negative except as per the HPI above.  Physical Exam: Vitals:   09/19/22 0953  BP: 122/60  Pulse: 93  Weight: 85.1 kg  Height: '5\' 4"'$  (1.626 m)     GEN- The patient is a well appearing elderly female, alert and oriented x 3 today.   HEENT-head normocephalic, atraumatic, sclera clear, conjunctiva pink, hearing intact, trachea midline. Lungs- Clear to ausculation bilaterally, normal work of breathing Heart- Regular rate and rhythm, no murmurs, rubs or gallops  GI- soft, NT, ND, + BS Extremities- no clubbing, cyanosis, or edema MS- no significant deformity or atrophy Skin- no rash or lesion Psych- euthymic mood, full affect Neuro- LUE weakness   Wt Readings from Last 3 Encounters:  09/19/22 85.1 kg  09/03/22 89.6 kg  08/25/22 87 kg    EKG today demonstrates  SR Vent. rate 93 BPM PR interval 150 ms QRS duration 80 ms QT/QTcB 352/437 ms  Echo 08/02/21 demonstrated   1. Technically difficult study with poor echo windows. Patient declined  Definity contrast.   2. Left ventricular ejection fraction, by estimation, is 55 to 60%. The  left ventricle has normal function. Left ventricular endocardial border  not optimally defined to evaluate regional wall motion. Left ventricular  diastolic parameters are consistent  with Grade I diastolic dysfunction (impaired relaxation).   3. Right ventricular systolic function is normal. The right ventricular  size is normal. The RV wall appears thickened, however, this may be due to  a prominent fat pad.   4. The mitral  valve is normal in structure. Trivial mitral valve  regurgitation.  5. The aortic valve is tricuspid. There is mild calcification of the  aortic valve. There is mild thickening of the aortic valve. Aortic valve  regurgitation is not visualized. Aortic valve sclerosis/calcification is  present, without any evidence of  aortic stenosis.   6. The inferior vena cava is normal in size with greater than 50%  respiratory variability, suggesting right atrial pressure of 3 mmHg.   Conclusion(s)/Recommendation(s): No intracardiac source of embolism  detected on this transthoracic study. Consider a transesophageal  echocardiogram to exclude cardiac source of embolism if clinically  indicated.   Epic records are reviewed at length today  CHA2DS2-VASc Score = 7  The patient's score is based upon: CHF History: 0 HTN History: 1 Diabetes History: 1 Stroke History: 2 Vascular Disease History: 0 Age Score: 2 Gender Score: 1       ASSESSMENT AND PLAN: 1. Paroxysmal Atrial Fibrillation (ICD10:  I48.0) The patient's CHA2DS2-VASc score is 7, indicating a 11.2% annual risk of stroke.   ILR shows 0.1% afib burden with no interim episodes.  Continue Eliquis 5 mg BID Recent CBC reviewed.   2. Secondary Hypercoagulable State (ICD10:  D68.69) The patient is at significant risk for stroke/thromboembolism based upon her CHA2DS2-VASc Score of 7.  Continue Apixaban (Eliquis).   3. Obstructive sleep apnea Encouraged compliance with CPAP therapy.  4. HTN Stable, no changes today.   Follow up in the AF clinic in 6 months.    [facility] [address] [place],  [address] [phone] 09/19/2022 10:04 AM

## 2022-09-23 ENCOUNTER — Ambulatory Visit: Payer: Medicare HMO | Admitting: Occupational Therapy

## 2022-09-23 ENCOUNTER — Encounter: Payer: Medicare HMO | Admitting: Speech Pathology

## 2022-09-23 ENCOUNTER — Ambulatory Visit: Payer: Medicare HMO | Attending: Adult Health

## 2022-09-23 DIAGNOSIS — R262 Difficulty in walking, not elsewhere classified: Secondary | ICD-10-CM | POA: Insufficient documentation

## 2022-09-23 DIAGNOSIS — M6281 Muscle weakness (generalized): Secondary | ICD-10-CM

## 2022-09-23 DIAGNOSIS — R278 Other lack of coordination: Secondary | ICD-10-CM

## 2022-09-23 DIAGNOSIS — M542 Cervicalgia: Secondary | ICD-10-CM | POA: Insufficient documentation

## 2022-09-23 DIAGNOSIS — R2681 Unsteadiness on feet: Secondary | ICD-10-CM | POA: Diagnosis present

## 2022-09-23 DIAGNOSIS — M25552 Pain in left hip: Secondary | ICD-10-CM | POA: Diagnosis present

## 2022-09-23 NOTE — Therapy (Signed)
OUTPATIENT OCCUPATIONAL THERAPY TREATMENT NOTE   Patient Name: Alyssa Mayo MRN: [id number] DOB:Feb 03, 1946, 77 y.o., female Today's Date: 03/14/2022   PCP: Alyssa Pink, MD REFERRING PROVIDER: Frann Rider, NP     OT End of Session - 09/23/22 1102     Visit Number 3    Number of Visits 24    Date for OT Re-Evaluation 12/09/22    Authorization Type Progress reporting period starting 09/16/2022    OT Start Time 1100    OT Stop Time 1145    OT Time Calculation (min) 45 min    Activity Tolerance Patient tolerated treatment well    Behavior During Therapy [facility] for tasks assessed/performed                                    Past Medical History:  Diagnosis Date   Abnormal levels of other serum enzymes 07/17/2015   Arthritis     Constipation 07/11/2015   COVID-19 03/06/2021   Diabetes mellitus without complication ([place])     Elevated liver enzymes     Fatty liver     Generalized abdominal pain 07/11/2015   Hyperlipidemia     Hypertension     Lifelong obesity     Macular degeneration     Morbid obesity due to excess calories ([place]) 07/11/2015   Other fatigue 07/11/2015   Sleep apnea     Spinal headache           Past Surgical History:  Procedure Laterality Date   ABDOMINAL HYSTERECTOMY       APPENDECTOMY       CATARACT EXTRACTION       CERVICAL LAMINECTOMY   07/21/1985   CESAREAN SECTION       COLONOSCOPY   07/21/2012    diverticulosis, [facility] Dr. Donnella Mayo    COLONOSCOPY WITH PROPOFOL N/A 02/04/2019    Procedure: COLONOSCOPY WITH PROPOFOL;  Surgeon: Alyssa Sails, MD;  Location: [facility] ENDOSCOPY;  Service: Endoscopy;  Laterality: N/A;   COLONOSCOPY WITH PROPOFOL N/A 03/18/2021    Procedure: COLONOSCOPY WITH PROPOFOL;  Surgeon: Alyssa Rubenstein, MD;  Location: [facility] ENDOSCOPY;  Service: Endoscopy;  Laterality: N/A;  DM   DIAGNOSTIC LAPAROSCOPY       ESOPHAGOGASTRODUODENOSCOPY (EGD) WITH PROPOFOL N/A 02/04/2019    Procedure: ESOPHAGOGASTRODUODENOSCOPY  (EGD) WITH PROPOFOL;  Surgeon: Alyssa Sails, MD;  Location: [facility] ENDOSCOPY;  Service: Endoscopy;  Laterality: N/A;   ESOPHAGOGASTRODUODENOSCOPY (EGD) WITH PROPOFOL N/A 03/18/2021    Procedure: ESOPHAGOGASTRODUODENOSCOPY (EGD) WITH PROPOFOL;  Surgeon: Alyssa Rubenstein, MD;  Location: [facility] ENDOSCOPY;  Service: Endoscopy;  Laterality: N/A;   LOOP RECORDER INSERTION N/A 08/05/2021    Procedure: LOOP RECORDER INSERTION;  Surgeon: Alyssa Lance, MD;  Location: [place] CV LAB;  Service: Cardiovascular;  Laterality: N/A;   [place]       TUBAL LIGATION            Patient Active Problem List    Diagnosis Date Noted   Paroxysmal atrial fibrillation ([place]) 08/08/2022   Hypercoagulable state due to paroxysmal atrial fibrillation ([place]) 08/08/2022   Cerebral edema ([place]) 08/07/2021   OSA (obstructive sleep apnea) 08/07/2021   Right middle cerebral artery stroke ([place]) 08/07/2021   CVA (cerebral vascular accident) ([place]) 08/01/2021   GERD (gastroesophageal reflux disease) 07/20/2019   Advanced care planning/counseling discussion 12/17/2016   Skin lesions, generalized 11/13/2016   Chronic right hip pain 06/17/2016  Vitamin D deficiency 09/26/2015   Diastasis recti 08/08/2015   Elevated serum GGT level 07/24/2015   Essential hypertension 07/17/2015   Fatty liver 07/17/2015   Elevated alkaline phosphatase level 07/17/2015   Elevated serum glutamic pyruvic transaminase (SGPT) level 07/17/2015   Abnormal finding on EKG 07/11/2015   Morbid obesity ([place]) 07/11/2015   Colon, diverticulosis 07/11/2015   Abdominal wall hernia 07/11/2015   DM (diabetes mellitus), type 2 with neurological complications ([place])     Hyperlipidemia        ONSET DATE: 08/01/2021   REFERRING DIAG: CVA   THERAPY DIAG:  Muscle weakness (generalized)   Rationale for Evaluation and Treatment Rehabilitation   SUBJECTIVE:    SUBJECTIVE STATEMENT:    Pt. reports that she feels like the fog from [place] has  finally lifted.    Pt accompanied by: self   PERTINENT HISTORY:  Pt. is a 77 y.o. female who had a unexpectedly hospitalized from 2/12-2/14/2024 for COVID-19, and a UTI.  Pt. 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 Pt. attended inpatient rehab from 08/07/2021-08/22/2021. Pt. received Home health therapy services this past spring.  Pt. has recently had an assessment through driver rehabilitation services at [facility] with recommendations for referrals for  outpatient OT/PT, and ST services. Pt. PMHx includes: HTN, Hyperlipidemia, Macular degeneration, DM, and Obesity.   PRECAUTIONS: None   WEIGHT BEARING RESTRICTIONS No   PAIN:  Are you having pain? 4/10 left neck   FALLS: Has patient fallen in last 6 months? Yes.   LIVING ENVIRONMENT: Lives with: lives with their family  Son  Alyssa Mayo Lives in: House/apartment Main living are on one floor Stairs:  2 steps to enter Has following equipment at home: Single point cane, Wheelchair (manual), shower chair, Grab bars, bed rail, and rubber mat.   PLOF: Independent   PATIENT GOALS: To be able to Drive, and cook again   OBJECTIVE:    HAND DOMINANCE: Right    ADLs:  Transfers/ambulation related to ADLs: Independent Eating: Independent Grooming: [employer] Pt. reports being unable to use her left hand to perform haircare.  UB Dressing: Independent pullover shirt, independent now with fastening a bra LB Dressing: Independent donning pants socks, and slide on shoes Toileting: Independent Bathing: Independent Tub Shower transfers: Walk-in shower Independent now     IADLs: Shopping: Needs to be accompanied to the grocery store. Light housekeeping: Do  Meal Prep:  Is able to perform light meal prep Community mobility: Relies on son, and friend Medication management: Son sets up Building surveyor management:TBD Handwriting: TBD   MOBILITY STATUS: Hx of falls out of bed     ACTIVITY  TOLERANCE: Activity tolerance:  10-20 min. Before rest break   FUNCTIONAL OUTCOME MEASURES: FOTO: 61  TR score: 62   UPPER EXTREMITY ROM      Active ROM Right eval Left eval  Shoulder flexion WFL 54(74) scaption  Shoulder abduction [facility] 58(75)  Shoulder adduction      Shoulder extension      Shoulder internal rotation      Shoulder external rotation      Elbow flexion [facility] WFL  Elbow extension [facility] WFL  Wrist flexion WFL TBD  Wrist extension WFL TBD  Wrist ulnar deviation      Wrist radial deviation      Wrist pronation      Wrist supination      (Blank rows = not tested)     UPPER EXTREMITY MMT:  MMT Right eval Left eval  Shoulder flexion 4+/5 2+/5  Shoulder abduction 4+/5 2+/5  Shoulder adduction      Shoulder extension      Shoulder internal rotation      Shoulder external rotation      Middle trapezius      Lower trapezius      Elbow flexion 4+/5 3+/5  Elbow extension 4+/5 3+/5  Wrist flexion 4+/5 TBD  Wrist extension 4+/5 TBD  Wrist ulnar deviation      Wrist radial deviation      Wrist pronation      Wrist supination      (Blank rows = not tested)   HAND FUNCTION: Grip strength: Right: 33 lbs; Left: 11 lbs, Lateral pinch: Right: 17 lbs, Left: 2 lbs, and 3 point pinch: Right: 17 lbs, Left: 2 lbs   COORDINATION:   9 Hole Peg test: Right: 27 sec.  sec; Left: 52 sec.   SENSATION: Light touch: WFL Proprioception: WFL   COGNITION: Overall cognitive status: Within functional limits for tasks assessed   VISION: Subjective report: Glasses. Just received a new prescription to increase bifocal strength Baseline vision: Macular Degeneration Visual history: macular degeneration   VISION ASSESSMENT: To be further assessed in functional context  Left sided Awareness       PERCEPTION: Limited left sided awareness     TODAY'S TREATMENT:    Therapeutic Ex:  Pt. worked on [employer], and reciprocal motion using the UBE while seated for 8  min. with no resistance. Constant monitoring was provided. Pt. Worked on AAROM for Left shoulder flexion, and abduction. Pt. performed gross gripping with a gross grip strengthener. Pt. worked on sustaining grip while grasping pegs and placing them into container placed at the tabletop. The Gripper was set to 6.6# of grip strength resistance.    Neuromuscular re-ed:  Pt. performed [facility] skills grasping the jumbo pegs. Pt. worked on translatory movements moving the pegs through her hand from her palm to the tip of the thumb, 2nd, and 3rd digits in preparation for placing them vertically in the pegboard.  Pt. Was able to store 4 pegs in her hand.   Manual Therapy:  Pt. Tolerated scapular mobilizations for elevation, depression, and abduction/rotation. Pt. Tolerated soft tissue massage to the scapular musculature, and shoulder region. Pt reports it feels great. Manual therapy was performed independent of, and in preparation for ROM, therapeutic Ex.   PATIENT EDUCATION: Education details: Compensatory strategies during typing. Person educated: Patient Education method: verbal cues Education comprehension: verbalized understanding and pt does not plan to cook unless son is present.     HOME EXERCISE PROGRAM:  Reviewed home activities to enhance left hand digit extension  during typing skills.       GOALS: GOALS: Goals reviewed with patient? Yes     SHORT TERM GOALS: Target date: 10/28/2022     Pt. Will improve FOTO score by 2 points to reflect improved pt. perceived functional performance Baseline: FOTO: 61 TR score: 62  Goal status: INITIAL   LONG TERM GOALS: Target date: 12/09/2022    Pt. Will improve left UE strength by 2 mm grades to assist with repositioning her grandson on her lap. Baseline: Eval: left shoulder flexion, abduction 2+/5, elbow flexion, and extension 3+/5 Goal status: INITIAL   2.  Pt. will be able to independently hold and use a blow dryer, and brush for hair  care. Baseline: Eval: Pt. Is unable to sustain her BUEs in elevation, and use  a blow dryer, and brush. Goal status: INITIAL   3.  Pt. Will improve left lateral pinch strength by 3# to be able to independently cut meat. Baseline: Eval: 2#. Pt. has difficulty stabilizing utensil, and food while cutting food. Goal status:  INITIAL   4.  Pt. Will improve Left hand [facility] skills to be able to be able to independently, and efficiently manipulate small objects during ADL tasks. Baseline: Eval: Left: 52 sec. Right: 27 sec. Goal status: INITIAL     6.: Pt. will improve with left grip strength to be able to securely hold a drink.              Baseline: Eval:  Left grip strength 11#. Pt. has difficulty holding a full drink  securely with the left hand.             Goal status: INITIAL   7. Pt. Will improve typing speed, and accuracy in preparation for efficiently typing simple email message.             Baseline: Pt. Presents with difficulty typing an email. Typing speed, and accuracy TBD             Goal status: INITIAL                                        CLINICAL IMPRESSION:   Pt. reports still feeling weak after being hospitalized with COVID-19 and an UTI. Pt. Required less time , and fewer cues to complete each task. Pt. required cues for visual demonstration of proper technique, and proper movement pattern. Pt. Tolerated manual therapy tot he scapula, and shoulder, the UBE. Pt. Continues to present with limited grip strength. Pt. Was able to store 4 large pegs in the palm of her left hand. Pt. required assist to perform each task. Pt. continues to benefit from OT services in order to work towards improving, and maximizing independence with ADLs, and IADL tasks.    PERFORMANCE DEFICITS in functional skills including ADLs, IADLs, coordination, dexterity, sensation, ROM, strength, FMC, decreased knowledge of use of DME, vision, and UE functional use, cognitive skills including attention, and  psychosocial skills including coping strategies, environmental adaptation, habits, and routines and behaviors.    IMPAIRMENTS are limiting patient from ADLs, IADLs, and social participation.    COMORBIDITIES may have co-morbidities  that affects occupational performance. Patient will benefit from skilled OT to address above impairments and improve overall function.   MODIFICATION OR ASSISTANCE TO COMPLETE EVALUATION: Min-Moderate modification of tasks or assist with assess necessary to complete an evaluation.   OT OCCUPATIONAL PROFILE AND HISTORY: Detailed assessment: Review of records and additional review of physical, cognitive, psychosocial history related to current functional performance.   CLINICAL DECISION MAKING: Moderate - several treatment options, min-mod task modification necessary   REHAB POTENTIAL: Good   EVALUATION COMPLEXITY: Moderate      PLAN: OT FREQUENCY: 2x/week   OT DURATION: 12 weeks   PLANNED INTERVENTIONS: self care/ADL training, therapeutic exercise, therapeutic activity, neuromuscular re-education, manual therapy, passive range of motion, and paraffin   RECOMMENDED OTHER SERVICES: ST, and PT   CONSULTED AND AGREED WITH PLAN OF CARE: Patient   PLAN FOR NEXT SESSION:  L hand strengthening and coordination exercises   Harrel Carina, MS, OTR/L

## 2022-09-23 NOTE — Therapy (Signed)
OUTPATIENT PHYSICAL THERAPY CERVICAL TREATMENT   Patient Name: Alyssa Mayo MRN: [id number] DOB:[birth year], 77 y.o., female Today's Date: 09/23/2022  END OF SESSION:   PT End of Session - 09/23/22 1116     Visit Number 3    Number of Visits 12    Date for PT Re-Evaluation 12/09/22    Progress Note Due on Visit 10    PT Start Time 1019    PT Stop Time 1100    PT Time Calculation (min) 41 min              Past Medical History:  Diagnosis Date   Abnormal levels of other serum enzymes 07/17/2015   Arthritis    Constipation 07/11/2015   COVID-19 03/06/2021   Diabetes mellitus without complication ([place])    Elevated liver enzymes    Fatty liver    Generalized abdominal pain 07/11/2015   Hyperlipidemia    Hypertension    Lifelong obesity    Macular degeneration    Morbid obesity due to excess calories ([place]) 07/11/2015   Other fatigue 07/11/2015   Sleep apnea    Spinal headache    Past Surgical History:  Procedure Laterality Date   ABDOMINAL HYSTERECTOMY     APPENDECTOMY     CATARACT EXTRACTION     CERVICAL LAMINECTOMY  07/21/1985   CESAREAN SECTION     COLONOSCOPY  07/21/2012   diverticulosis, [facility] Dr. Donnella Sham    COLONOSCOPY WITH PROPOFOL N/A 02/04/2019   Procedure: COLONOSCOPY WITH PROPOFOL;  Surgeon: Lollie Sails, MD;  Location: [facility] ENDOSCOPY;  Service: Endoscopy;  Laterality: N/A;   COLONOSCOPY WITH PROPOFOL N/A 03/18/2021   Procedure: COLONOSCOPY WITH PROPOFOL;  Surgeon: Lesly Rubenstein, MD;  Location: [facility] ENDOSCOPY;  Service: Endoscopy;  Laterality: N/A;  [place]   DIAGNOSTIC LAPAROSCOPY     ESOPHAGOGASTRODUODENOSCOPY (EGD) WITH PROPOFOL N/A 02/04/2019   Procedure: ESOPHAGOGASTRODUODENOSCOPY (EGD) WITH PROPOFOL;  Surgeon: Lollie Sails, MD;  Location: [facility] ENDOSCOPY;  Service: Endoscopy;  Laterality: N/A;   ESOPHAGOGASTRODUODENOSCOPY (EGD) WITH PROPOFOL N/A 03/18/2021   Procedure: ESOPHAGOGASTRODUODENOSCOPY (EGD) WITH PROPOFOL;  Surgeon:  Lesly Rubenstein, MD;  Location: [facility] ENDOSCOPY;  Service: Endoscopy;  Laterality: N/A;   LOOP RECORDER INSERTION N/A 08/05/2021   Procedure: LOOP RECORDER INSERTION;  Surgeon: Evans Lance, MD;  Location: [place] CV LAB;  Service: Cardiovascular;  Laterality: N/A;   [place]     TUBAL LIGATION     Patient Active Problem List   Diagnosis Date Noted   UTI (urinary tract infection) 09/01/2022   COVID-19 virus infection 09/01/2022   Chronic diastolic CHF (congestive heart failure) ([place]) 09/01/2022   Chronic kidney disease, stage 3a ([place]) 09/01/2022   Obesity (BMI 30-39.9) 09/01/2022   Paroxysmal atrial fibrillation ([place]) 08/08/2022   Hypercoagulable state due to paroxysmal atrial fibrillation ([place]) 08/08/2022   Cerebral edema ([place]) 08/07/2021   OSA (obstructive sleep apnea) 08/07/2021   Right middle cerebral artery stroke ([place]) 08/07/2021   CVA (cerebral vascular accident) ([place]) 08/01/2021   GERD (gastroesophageal reflux disease) 07/20/2019   Advanced care planning/counseling discussion 12/17/2016   Skin lesions, generalized 11/13/2016   Chronic right hip pain 06/17/2016   Vitamin D deficiency 09/26/2015   Diastasis recti 08/08/2015   Elevated serum GGT level 07/24/2015   Essential hypertension 07/17/2015   Fatty liver 07/17/2015   Elevated alkaline phosphatase level 07/17/2015   Elevated serum glutamic pyruvic transaminase (SGPT) level 07/17/2015   Abnormal finding on EKG 07/11/2015   Morbid obesity ([place]) 07/11/2015  Colon, diverticulosis 07/11/2015   Abdominal wall hernia 07/11/2015   DM (diabetes mellitus), type 2 with neurological complications ([place])    Hyperlipidemia     PCP: [place] Pink, MD  REFERRING PROVIDER: [place] Pink, MD & Scarlett Presto, [place]  REFERRING DIAG: [phone] (ICD-10-CM) - Cerebral infarction due to unspecified occlusion or stenosis of unspecified precerebral arteries, Cervicalgia with degenerative disc  THERAPY DIAG:  Cervical  pain  Painful cervical ROM  Rationale for Evaluation and Treatment: Rehabilitation  ONSET DATE: 09/03/22  SUBJECTIVE:                                                                                                                                                                                                         SUBJECTIVE STATEMENT:  Pt reports she felt a little sore after the last session, but is overall doing well.   PERTINENT HISTORY:   Per chart and confirmed by pt: Pt is a 77 yo female with RMCA CVA infarction with hemorrhagic transformation 08/01/2021, with inpatient rehab 08/07/2021-08/22/2021 followed by [facility] therapy until June. Pt now currently being seen for outpatient OT and ST services. Pt reports limitations in stamina persist. Other PMH is significant for HTN, HLD, macular degeneration, DM, obesity, sleep apnea, arthritis, chronic R hip pain, vitamin D deficiency, diastasis recti, abdominal wall hernia, hx of abdominal hysterectomy, appendectomy, spine surgery (years ago).   PAIN:  Are you having pain? Yes: NPRS scale: 4/10 Pain location: L shoulder/neck area posteriorly Pain description: dull ache Aggravating factors: sitting Relieving factors: laying down  PRECAUTIONS: None  WEIGHT BEARING RESTRICTIONS: No  FALLS:  Has patient fallen in last 6 months? Yes. Number of falls 2  LIVING ENVIRONMENT: Lives with: lives with their son Lives in: House/apartment Stairs: Yes: Internal: 13 steps; doesn't go up to the second floor and External: 4 steps; can reach both Has following equipment at home: Single point cane and Walker - 2 wheeled  OCCUPATION: Retired  PLOF: Independent with basic ADLs  PATIENT GOALS: "To make my neck quit hurting and to prevent it from getting worse"  NEXT MD VISIT: Marena Chancy, but maybe May  OBJECTIVE:   DIAGNOSTIC FINDINGS:  No imaging done on the neck.  PATIENT SURVEYS:  NDI 15/50 = 30% disability FOTO 65/68  COGNITION: Overall  cognitive status: History of cognitive impairments - at baseline  SENSATION: WFL  POSTURE: rounded shoulders and forward head  PALPATION:     CERVICAL ROM:   Active ROM A/PROM (deg) eval  Flexion 39  Extension 10  Right lateral flexion 7  Left  lateral flexion 6  Right rotation 49  Left rotation 30   (Blank rows = not tested)  UPPER EXTREMITY ROM:  Active ROM Right eval Left eval  Shoulder flexion 128 81  Shoulder abduction 160 68   (Blank rows = not tested)   UPPER EXTREMITY MMT:  Grossly decreased (3+/5) in the L UE due to pain and lack of ROM  (Blank rows = not tested)  CERVICAL SPECIAL TESTS:  Spurling's test: Negative and Sharp pursor's test: Negative  FUNCTIONAL TESTS:  Not performed at this time.  TODAY'S TREATMENT: DATE: 09/23/22    Manual:  Supine STM to cervical region to increase extensibility of the paraspinals Supine suboccipital release technique to decrease cervicalgia Supine manual traction performed in order to increase joint space in cervical region for pain relief Supine cervical upglides/downglides, 30 sec bouts Supine UT/Levator stretch, 30 sec bouts to increase tissue extensibility of the cervical region Supine STM with TP release applied to UT's for pain modulation.  Pt requires light pressure due to being sensitive in that region.       PATIENT EDUCATION:  Education details: Pt educated on role of PT and services provided during current POC, along with prognosis and information about the clinic. Person educated: Patient and Son, [name] Education method: Explanation Education comprehension: verbalized understanding  HOME EXERCISE PROGRAM:  Access Code: [facility] URL: [email or web address] Date: 09/18/2022 Prepared by: [place]  Exercises - Seated Upper Trapezius Stretch  - 1 x daily - 7 x weekly - 3 sets - 10 reps - Seated Levator Scapulae Stretch  - 1 x daily - 7 x weekly - 3 sets - 10 reps - Seated  Scapular Retraction  - 1 x daily - 7 x weekly - 3 sets - 10 reps  ASSESSMENT:  CLINICAL IMPRESSION:  Pt continues to have sensitivity in the UT's that requires lighter touch/grip when performing the TP release technique.  Pt is making good progress towards current goals and improving her overall ROM.  Pt will likely be introduced to more stabilization and UE exercises in future sessions.  Pt is still limited by L UE due to decreased ROM and pain with mobility.   Pt will continue to benefit from skilled therapy to address remaining deficits in order to improve overall QoL and return to PLOF.        OBJECTIVE IMPAIRMENTS: decreased activity tolerance, decreased cognition, decreased ROM, decreased strength, decreased safety awareness, hypomobility, and pain.   ACTIVITY LIMITATIONS: carrying, lifting, bending, sitting, standing, squatting, and reach over head  PARTICIPATION LIMITATIONS: meal prep, cleaning, laundry, driving, shopping, community activity, and yard work  PERSONAL FACTORS: Age, Behavior pattern, Past/current experiences, Time since onset of injury/illness/exacerbation, and 3+ comorbidities: arthritis, DM2, HTN, Obesity,   are also affecting patient's functional outcome.   REHAB POTENTIAL: Good  CLINICAL DECISION MAKING: Stable/uncomplicated  EVALUATION COMPLEXITY: Low   GOALS: Goals reviewed with patient? Yes  SHORT TERM GOALS: Target date: 10/14/2022  Pt will be independent with HEP in order to improve strength of UE's and ROM of the neck in order to return to PLOF. Baseline: Not given at initial evaluation. Goal status: INITIAL    LONG TERM GOALS: Target date: 12/09/2022  Patient will demonstrate improved function as evidenced by a score of 68 on FOTO measure for full participation in activities at home and in the community. Baseline: 65 Goal status: INITIAL  2.  Pt to improve NDI by 5 points or 10% in order to demonstrate a reduction in overall  disability caused  by neck pain at this time. Baseline: 15/50 or 30% disability Goal status: INITIAL  3.  Pt to demonstrate improved cervical rotation to 60 deg bilaterally in order to perform tasks around the house that require turning of the head. Baseline: R/L Cervical Rotation: 49/30 Goal status: INITIAL  4.  Pt to report 0/10 pain in the neck following light household chores at home in order to demonstrate an overall reduction in pain and an improvement in QoL. Baseline: 4/10 at rest Goal status: INITIAL   PLAN:  PT FREQUENCY: 1-2x/week  PT DURATION: 12 weeks  PLANNED INTERVENTIONS: Therapeutic exercises, Therapeutic activity, Neuromuscular re-education, Balance training, Gait training, Patient/Family education, Self Care, Joint mobilization, Joint manipulation, Vestibular training, Canalith repositioning, Visual/preceptual remediation/compensation, DME instructions, Dry Needling, Cognitive remediation, Spinal manipulation, Spinal mobilization, Cryotherapy, Moist heat, Traction, Manual therapy, and Re-evaluation    PLAN FOR NEXT SESSION:    Manual therapy for neck and improve ROM, dry needling if tolerable.  Introduce stabilization exercises for shoulder and postural related techniques.    Gwenlyn Saran, PT, DPT Physical Therapist - [facility]  09/23/22, 11:43 AM

## 2022-09-25 ENCOUNTER — Ambulatory Visit: Payer: Medicare HMO | Admitting: Occupational Therapy

## 2022-09-25 ENCOUNTER — Encounter: Payer: Medicare HMO | Admitting: Speech Pathology

## 2022-09-25 ENCOUNTER — Ambulatory Visit: Payer: Medicare HMO | Admitting: Physical Therapy

## 2022-09-25 DIAGNOSIS — R262 Difficulty in walking, not elsewhere classified: Secondary | ICD-10-CM

## 2022-09-25 DIAGNOSIS — M542 Cervicalgia: Secondary | ICD-10-CM | POA: Diagnosis not present

## 2022-09-25 DIAGNOSIS — M6281 Muscle weakness (generalized): Secondary | ICD-10-CM

## 2022-09-25 NOTE — Therapy (Signed)
OUTPATIENT OCCUPATIONAL THERAPY TREATMENT NOTE   Patient Name: Alyssa Mayo MRN: [id number] DOB:[birth year], 77 y.o., female Today's Date: 03/14/2022   PCP: Alyssa Pink, MD REFERRING PROVIDER: Frann Rider, NP     OT End of Session - 09/25/22 1131     Visit Number 4    Number of Visits 24    Date for OT Re-Evaluation 12/09/22    Authorization Type Progress reporting period starting 09/16/2022    OT Start Time 1104    OT Stop Time 1145    OT Time Calculation (min) 41 min    Activity Tolerance Patient tolerated treatment well    Behavior During Therapy [facility] for tasks assessed/performed                                    Past Medical History:  Diagnosis Date   Abnormal levels of other serum enzymes 07/17/2015   Arthritis     Constipation 07/11/2015   COVID-19 03/06/2021   Diabetes mellitus without complication ([place])     Elevated liver enzymes     Fatty liver     Generalized abdominal pain 07/11/2015   Hyperlipidemia     Hypertension     Lifelong obesity     Macular degeneration     Morbid obesity due to excess calories ([place]) 07/11/2015   Other fatigue 07/11/2015   Sleep apnea     Spinal headache           Past Surgical History:  Procedure Laterality Date   ABDOMINAL HYSTERECTOMY       APPENDECTOMY       CATARACT EXTRACTION       CERVICAL LAMINECTOMY   07/21/1985   CESAREAN SECTION       COLONOSCOPY   07/21/2012    diverticulosis, [facility] Dr. Donnella Mayo    COLONOSCOPY WITH PROPOFOL N/A 02/04/2019    Procedure: COLONOSCOPY WITH PROPOFOL;  Surgeon: Alyssa Sails, MD;  Location: [facility] ENDOSCOPY;  Service: Endoscopy;  Laterality: N/A;   COLONOSCOPY WITH PROPOFOL N/A 03/18/2021    Procedure: COLONOSCOPY WITH PROPOFOL;  Surgeon: Alyssa Rubenstein, MD;  Location: [facility] ENDOSCOPY;  Service: Endoscopy;  Laterality: N/A;  DM   DIAGNOSTIC LAPAROSCOPY       ESOPHAGOGASTRODUODENOSCOPY (EGD) WITH PROPOFOL N/A 02/04/2019    Procedure: ESOPHAGOGASTRODUODENOSCOPY  (EGD) WITH PROPOFOL;  Surgeon: Alyssa Sails, MD;  Location: [facility] ENDOSCOPY;  Service: Endoscopy;  Laterality: N/A;   ESOPHAGOGASTRODUODENOSCOPY (EGD) WITH PROPOFOL N/A 03/18/2021    Procedure: ESOPHAGOGASTRODUODENOSCOPY (EGD) WITH PROPOFOL;  Surgeon: Alyssa Rubenstein, MD;  Location: [facility] ENDOSCOPY;  Service: Endoscopy;  Laterality: N/A;   LOOP RECORDER INSERTION N/A 08/05/2021    Procedure: LOOP RECORDER INSERTION;  Surgeon: Alyssa Lance, MD;  Location: [place] CV LAB;  Service: Cardiovascular;  Laterality: N/A;   [place]       TUBAL LIGATION            Patient Active Problem List    Diagnosis Date Noted   Paroxysmal atrial fibrillation ([place]) 08/08/2022   Hypercoagulable state due to paroxysmal atrial fibrillation ([place]) 08/08/2022   Cerebral edema ([place]) 08/07/2021   OSA (obstructive sleep apnea) 08/07/2021   Right middle cerebral artery stroke ([place]) 08/07/2021   CVA (cerebral vascular accident) ([place]) 08/01/2021   GERD (gastroesophageal reflux disease) 07/20/2019   Advanced care planning/counseling discussion 12/17/2016   Skin lesions, generalized 11/13/2016   Chronic right hip pain 06/17/2016  Vitamin D deficiency 09/26/2015   Diastasis recti 08/08/2015   Elevated serum GGT level 07/24/2015   Essential hypertension 07/17/2015   Fatty liver 07/17/2015   Elevated alkaline phosphatase level 07/17/2015   Elevated serum glutamic pyruvic transaminase (SGPT) level 07/17/2015   Abnormal finding on EKG 07/11/2015   Morbid obesity ([place]) 07/11/2015   Colon, diverticulosis 07/11/2015   Abdominal wall hernia 07/11/2015   DM (diabetes mellitus), type 2 with neurological complications ([place])     Hyperlipidemia        ONSET DATE: 08/01/2021   REFERRING DIAG: CVA   THERAPY DIAG:  Muscle weakness (generalized)   Rationale for Evaluation and Treatment Rehabilitation   SUBJECTIVE:    SUBJECTIVE STATEMENT:    Pt. reports that she feels like the fog from [place] has  finally lifted.    Pt accompanied by: self   PERTINENT HISTORY:  Pt. is a 77 y.o. female who had a unexpectedly hospitalized from 2/12-2/14/2024 for COVID-19, and a UTI.  Pt. 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 Pt. attended inpatient rehab from 08/07/2021-08/22/2021. Pt. received Home health therapy services this past spring.  Pt. has recently had an assessment through driver rehabilitation services at [facility] with recommendations for referrals for  outpatient OT/PT, and ST services. Pt. PMHx includes: HTN, Hyperlipidemia, Macular degeneration, DM, and Obesity.   PRECAUTIONS: None   WEIGHT BEARING RESTRICTIONS No   PAIN:  Are you having pain? 4/10 left neck   FALLS: Has patient fallen in last 6 months? Yes.   LIVING ENVIRONMENT: Lives with: lives with their family  Son  Alyssa Mayo Lives in: House/apartment Main living are on one floor Stairs:  2 steps to enter Has following equipment at home: Single point cane, Wheelchair (manual), shower chair, Grab bars, bed rail, and rubber mat.   PLOF: Independent   PATIENT GOALS: To be able to Drive, and cook again   OBJECTIVE:    HAND DOMINANCE: Right    ADLs:  Transfers/ambulation related to ADLs: Independent Eating: Independent Grooming: [employer] Pt. reports being unable to use her left hand to perform haircare.  UB Dressing: Independent pullover shirt, independent now with fastening a bra LB Dressing: Independent donning pants socks, and slide on shoes Toileting: Independent Bathing: Independent Tub Shower transfers: Walk-in shower Independent now     IADLs: Shopping: Needs to be accompanied to the grocery store. Light housekeeping: Do  Meal Prep:  Is able to perform light meal prep Community mobility: Relies on son, and friend Medication management: Son sets up Building surveyor management:TBD Handwriting: TBD   MOBILITY STATUS: Hx of falls out of bed     ACTIVITY  TOLERANCE: Activity tolerance:  10-20 min. Before rest break   FUNCTIONAL OUTCOME MEASURES: FOTO: 61  TR score: 62   UPPER EXTREMITY ROM      Active ROM Right eval Left eval  Shoulder flexion WFL 54(74) scaption  Shoulder abduction [facility] 58(75)  Shoulder adduction      Shoulder extension      Shoulder internal rotation      Shoulder external rotation      Elbow flexion [facility] WFL  Elbow extension [facility] WFL  Wrist flexion WFL TBD  Wrist extension WFL TBD  Wrist ulnar deviation      Wrist radial deviation      Wrist pronation      Wrist supination      (Blank rows = not tested)     UPPER EXTREMITY MMT:  MMT Right eval Left eval  Shoulder flexion 4+/5 2+/5  Shoulder abduction 4+/5 2+/5  Shoulder adduction      Shoulder extension      Shoulder internal rotation      Shoulder external rotation      Middle trapezius      Lower trapezius      Elbow flexion 4+/5 3+/5  Elbow extension 4+/5 3+/5  Wrist flexion 4+/5 TBD  Wrist extension 4+/5 TBD  Wrist ulnar deviation      Wrist radial deviation      Wrist pronation      Wrist supination      (Blank rows = not tested)   HAND FUNCTION: Grip strength: Right: 33 lbs; Left: 11 lbs, Lateral pinch: Right: 17 lbs, Left: 2 lbs, and 3 point pinch: Right: 17 lbs, Left: 2 lbs   COORDINATION:   9 Hole Peg test: Right: 27 sec.  sec; Left: 52 sec.   SENSATION: Light touch: WFL Proprioception: WFL   COGNITION: Overall cognitive status: Within functional limits for tasks assessed   VISION: Subjective report: Glasses. Just received a new prescription to increase bifocal strength Baseline vision: Macular Degeneration Visual history: macular degeneration   VISION ASSESSMENT: To be further assessed in functional context  Left sided Awareness       PERCEPTION: Limited left sided awareness     TODAY'S TREATMENT:    Therapeutic Ex:  Pt. worked on [employer], and reciprocal motion using the UBE while seated for 8  min. with no resistance. Constant monitoring was provided. Pt. Worked on AAROM for Left shoulder flexion using a 2# dowel weight from a seated position.  Pt. Worked on pinch strengthening in the left hand for lateral, and 3pt. pinch using yellow, and red resistive clips. Pt. worked on placing the clips at various vertical and horizontal angles. Tactile and verbal cues were required for eliciting the desired movement. Pt. Worked on moving the clips from one pinch position to the other.   Neuromuscular re-ed:  Pt. performed [facility] tasks using the Grooved pegboard. Pt. worked on grasping the grooved pegs from a horizontal position, and moving the pegs fro to a vertical position in the hand to prepare for placing them in the grooved slot.   Pt. worked on translatory movements moving the pegs through her hand from her palm to the tip of the thumb, and  2nd.   PATIENT EDUCATION: Education details: Compensatory strategies during typing. Person educated: Patient Education method: verbal cues Education comprehension: verbalized understanding and pt does not plan to cook unless son is present.     HOME EXERCISE PROGRAM:  Reviewed home activities to enhance left hand digit extension  during typing skills.       GOALS: GOALS: Goals reviewed with patient? Yes     SHORT TERM GOALS: Target date: 10/28/2022     Pt. Will improve FOTO score by 2 points to reflect improved pt. perceived functional performance Baseline: FOTO: 61 TR score: 62  Goal status: INITIAL   LONG TERM GOALS: Target date: 12/09/2022    Pt. Will improve left UE strength by 2 mm grades to assist with repositioning her grandson on her lap. Baseline: Eval: left shoulder flexion, abduction 2+/5, elbow flexion, and extension 3+/5 Goal status: INITIAL   2.  Pt. will be able to independently hold and use a blow dryer, and brush for hair care. Baseline: Eval: Pt. Is unable to sustain her BUEs in elevation, and use a blow dryer, and  brush. Goal status: INITIAL   3.  Pt. Will improve left lateral pinch strength by 3# to be able to independently cut meat. Baseline: Eval: 2#. Pt. has difficulty stabilizing utensil, and food while cutting food. Goal status:  INITIAL   4.  Pt. Will improve Left hand [facility] skills to be able to be able to independently, and efficiently manipulate small objects during ADL tasks. Baseline: Eval: Left: 52 sec. Right: 27 sec. Goal status: INITIAL     6.: Pt. will improve with left grip strength to be able to securely hold a drink.              Baseline: Eval:  Left grip strength 11#. Pt. has difficulty holding a full drink  securely with the left hand.             Goal status: INITIAL   7. Pt. Will improve typing speed, and accuracy in preparation for efficiently typing simple email message.             Baseline: Pt. Presents with difficulty typing an email. Typing speed, and accuracy TBD             Goal status: INITIAL                                        CLINICAL IMPRESSION:   Pt. reports that her left 3rd digit feels heavy. Pt. reports that she feels more clear, like the fog has lifted. Pt. continues  to require cues for hand position, and movement patterns for pinch position, and translatory movements of the hand. Pt. required assist to perform each task. Pt. continues to benefit from OT services in order to work towards improving, and maximizing independence with ADLs, and IADL tasks.    PERFORMANCE DEFICITS in functional skills including ADLs, IADLs, coordination, dexterity, sensation, ROM, strength, FMC, decreased knowledge of use of DME, vision, and UE functional use, cognitive skills including attention, and psychosocial skills including coping strategies, environmental adaptation, habits, and routines and behaviors.    IMPAIRMENTS are limiting patient from ADLs, IADLs, and social participation.    COMORBIDITIES may have co-morbidities  that affects occupational performance. Patient  will benefit from skilled OT to address above impairments and improve overall function.   MODIFICATION OR ASSISTANCE TO COMPLETE EVALUATION: Min-Moderate modification of tasks or assist with assess necessary to complete an evaluation.   OT OCCUPATIONAL PROFILE AND HISTORY: Detailed assessment: Review of records and additional review of physical, cognitive, psychosocial history related to current functional performance.   CLINICAL DECISION MAKING: Moderate - several treatment options, min-mod task modification necessary   REHAB POTENTIAL: Good   EVALUATION COMPLEXITY: Moderate      PLAN: OT FREQUENCY: 2x/week   OT DURATION: 12 weeks   PLANNED INTERVENTIONS: self care/ADL training, therapeutic exercise, therapeutic activity, neuromuscular re-education, manual therapy, passive range of motion, and paraffin   RECOMMENDED OTHER SERVICES: ST, and PT   CONSULTED AND AGREED WITH PLAN OF CARE: Patient   PLAN FOR NEXT SESSION:  L hand strengthening and coordination exercises   Harrel Carina, MS, OTR/L

## 2022-09-25 NOTE — Progress Notes (Signed)
Carelink Summary Report / Loop Recorder 

## 2022-09-25 NOTE — Therapy (Signed)
OUTPATIENT PHYSICAL THERAPY CERVICAL TREATMENT   Patient Name: Alyssa Mayo MRN: [id number] DOB:[birth year], 77 y.o., female Today's Date: 09/25/2022  END OF SESSION:   PT End of Session - 09/25/22 1103     Visit Number 4    Number of Visits 12    Date for PT Re-Evaluation 12/09/22    Progress Note Due on Visit 10    PT Start Time 1019    PT Stop Time 1100    PT Time Calculation (min) 41 min    Activity Tolerance Patient tolerated treatment well    Behavior During Therapy [facility] for tasks assessed/performed               Past Medical History:  Diagnosis Date   Abnormal levels of other serum enzymes 07/17/2015   Arthritis    Constipation 07/11/2015   COVID-19 03/06/2021   Diabetes mellitus without complication ([place])    Elevated liver enzymes    Fatty liver    Generalized abdominal pain 07/11/2015   Hyperlipidemia    Hypertension    Lifelong obesity    Macular degeneration    Morbid obesity due to excess calories ([place]) 07/11/2015   Other fatigue 07/11/2015   Sleep apnea    Spinal headache    Past Surgical History:  Procedure Laterality Date   ABDOMINAL HYSTERECTOMY     APPENDECTOMY     CATARACT EXTRACTION     CERVICAL LAMINECTOMY  07/21/1985   CESAREAN SECTION     COLONOSCOPY  07/21/2012   diverticulosis, [facility] Dr. Donnella Sham    COLONOSCOPY WITH PROPOFOL N/A 02/04/2019   Procedure: COLONOSCOPY WITH PROPOFOL;  Surgeon: Lollie Sails, MD;  Location: [facility] ENDOSCOPY;  Service: Endoscopy;  Laterality: N/A;   COLONOSCOPY WITH PROPOFOL N/A 03/18/2021   Procedure: COLONOSCOPY WITH PROPOFOL;  Surgeon: Lesly Rubenstein, MD;  Location: [facility] ENDOSCOPY;  Service: Endoscopy;  Laterality: N/A;  DM   DIAGNOSTIC LAPAROSCOPY     ESOPHAGOGASTRODUODENOSCOPY (EGD) WITH PROPOFOL N/A 02/04/2019   Procedure: ESOPHAGOGASTRODUODENOSCOPY (EGD) WITH PROPOFOL;  Surgeon: Lollie Sails, MD;  Location: [facility] ENDOSCOPY;  Service: Endoscopy;  Laterality: N/A;    ESOPHAGOGASTRODUODENOSCOPY (EGD) WITH PROPOFOL N/A 03/18/2021   Procedure: ESOPHAGOGASTRODUODENOSCOPY (EGD) WITH PROPOFOL;  Surgeon: Lesly Rubenstein, MD;  Location: [facility] ENDOSCOPY;  Service: Endoscopy;  Laterality: N/A;   LOOP RECORDER INSERTION N/A 08/05/2021   Procedure: LOOP RECORDER INSERTION;  Surgeon: Evans Lance, MD;  Location: [place] CV LAB;  Service: Cardiovascular;  Laterality: N/A;   [place]     TUBAL LIGATION     Patient Active Problem List   Diagnosis Date Noted   UTI (urinary tract infection) 09/01/2022   COVID-19 virus infection 09/01/2022   Chronic diastolic CHF (congestive heart failure) ([place]) 09/01/2022   Chronic kidney disease, stage 3a ([place]) 09/01/2022   Obesity (BMI 30-39.9) 09/01/2022   Paroxysmal atrial fibrillation (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/07/2021   CVA (cerebral vascular accident) ([place]) 08/01/2021   GERD (gastroesophageal reflux disease) 07/20/2019   Advanced care planning/counseling discussion 12/17/2016   Skin lesions, generalized 11/13/2016   Chronic right hip pain 06/17/2016   Vitamin D deficiency 09/26/2015   Diastasis recti 08/08/2015   Elevated serum GGT level 07/24/2015   Essential hypertension 07/17/2015   Fatty liver 07/17/2015   Elevated alkaline phosphatase level 07/17/2015   Elevated  serum glutamic pyruvic transaminase (SGPT) level 07/17/2015   Abnormal finding on EKG 07/11/2015   Morbid obesity ([place]) 07/11/2015   Colon, diverticulosis 07/11/2015   Abdominal wall hernia 07/11/2015   DM (diabetes mellitus), type 2 with neurological complications ([place])    Hyperlipidemia     PCP: Maryland Pink, MD  REFERRING PROVIDER: Maryland Pink, MD & Scarlett Presto, [place]  REFERRING DIAG: [phone] (ICD-10-CM) - Cerebral infarction due to  unspecified occlusion or stenosis of unspecified precerebral arteries, Cervicalgia with degenerative disc  THERAPY DIAG:  No diagnosis found.  Rationale for Evaluation and Treatment: Rehabilitation  ONSET DATE: 09/03/22  SUBJECTIVE:                                                                                                                                                                                                         SUBJECTIVE STATEMENT:  Pt reports that She is getting back to normal. No pain at start ot PT treatment session. Mild tightness in C spine and upper traps that is chronically present.    PERTINENT HISTORY:   Per chart and confirmed by pt: Pt is a 77 yo female with RMCA CVA infarction with hemorrhagic transformation 08/01/2021, with inpatient rehab 08/07/2021-08/22/2021 followed by [facility] therapy until June. Pt now currently being seen for outpatient OT and ST services. Pt reports limitations in stamina persist. Other PMH is significant for HTN, HLD, macular degeneration, DM, obesity, sleep apnea, arthritis, chronic R hip pain, vitamin D deficiency, diastasis recti, abdominal wall hernia, hx of abdominal hysterectomy, appendectomy, spine surgery (years ago).   PAIN:  Are you having pain? Yes: NPRS scale: 0/10 Pain location: L shoulder/neck area posteriorly Pain description: dull ache Aggravating factors: sitting Relieving factors: laying down  PRECAUTIONS: None  WEIGHT BEARING RESTRICTIONS: No  FALLS:  Has patient fallen in last 6 months? Yes. Number of falls 2  LIVING ENVIRONMENT: Lives with: lives with their son Lives in: House/apartment Stairs: Yes: Internal: 13 steps; doesn't go up to the second floor and External: 4 steps; can reach both Has following equipment at home: Single point cane and Walker - 2 wheeled  OCCUPATION: Retired  PLOF: Independent with basic ADLs  PATIENT GOALS: "To make my neck quit hurting and to prevent it from getting  worse"  NEXT MD VISIT: Marena Chancy, but maybe May  OBJECTIVE:   DIAGNOSTIC FINDINGS:  No imaging done on the neck.  PATIENT SURVEYS:  NDI 15/50 = 30% disability FOTO 65/68  COGNITION: Overall cognitive status: History of cognitive impairments - at baseline  SENSATION: WFL  POSTURE:  rounded shoulders and forward head  PALPATION:     CERVICAL ROM:   Active ROM A/PROM (deg) eval  Flexion 39  Extension 10  Right lateral flexion 7  Left lateral flexion 6  Right rotation 49  Left rotation 30   (Blank rows = not tested)  UPPER EXTREMITY ROM:  Active ROM Right eval Left eval  Shoulder flexion 128 81  Shoulder abduction 160 68   (Blank rows = not tested)   UPPER EXTREMITY MMT:  Grossly decreased (3+/5) in the L UE due to pain and lack of ROM  (Blank rows = not tested)  CERVICAL SPECIAL TESTS:  Spurling's test: Negative and Sharp pursor's test: Negative  FUNCTIONAL TESTS:  Not performed at this time.  TODAY'S TREATMENT: DATE: 09/25/22    Manual:  Supine STM to cervical region to increase extensibility of the paraspinals Supine suboccipital release technique to decrease cervicalgia Supine manual traction performed in order to increase joint space in cervical region for pain relief Supine cervical upglides/downglides, 30 sec bouts Supine UT/Levator stretch, 30 sec bouts to increase tissue extensibility of the cervical region Supine STM with TP release applied to UT's for pain modulation.  Pt requires light pressure due to being sensitive in that region. Seated STM with TP release to UT for pain modulation  Seated therex:   Mid row RTB 2 x 10 BUE High row RTB 2 x 10 BUE Thoracic extension over half bolster 2 x 10 with 2 sec hold Upper trap stretch 2 x 30 sec  Levator Scapulae stretch 2 x 30sec  Instruction from PT for improved ROM and activion of target muscle groups   PATIENT EDUCATION:  Education details: Pt educated on role of PT and services  provided during current POC, along with prognosis and information about the clinic. Person educated: Patient and Son, [name] Education method: Explanation Education comprehension: verbalized understanding  HOME EXERCISE PROGRAM:  Access Code: [facility] URL: [email or web address] Date: 09/18/2022 Prepared by:  [place]  Exercises - Seated Upper Trapezius Stretch  - 1 x daily - 7 x weekly - 3 sets - 10 reps - Seated Levator Scapulae Stretch  - 1 x daily - 7 x weekly - 3 sets - 10 reps - Seated Scapular Retraction  - 1 x daily - 7 x weekly - 3 sets - 10 reps  ASSESSMENT:  CLINICAL IMPRESSION:  Pt  demonstrates decreased sensitivity to UT STM on this day. Notes significant improvement in neck pain since starting PT treatment. Pt is making good progress towards current goals and improving her overall ROM.  Pt is still slightly limited by L UE due to decreased ROM and pain with mobility.   Pt will continue to benefit from skilled therapy to address remaining deficits in order to improve overall QoL and return to PLOF.        OBJECTIVE IMPAIRMENTS: decreased activity tolerance, decreased cognition, decreased ROM, decreased strength, decreased safety awareness, hypomobility, and pain.   ACTIVITY LIMITATIONS: carrying, lifting, bending, sitting, standing, squatting, and reach over head  PARTICIPATION LIMITATIONS: meal prep, cleaning, laundry, driving, shopping, community activity, and yard work  PERSONAL FACTORS: Age, Behavior pattern, Past/current experiences, Time since onset of injury/illness/exacerbation, and 3+ comorbidities: arthritis, DM2, HTN, Obesity,   are also affecting patient's functional outcome.   REHAB POTENTIAL: Good  CLINICAL DECISION MAKING: Stable/uncomplicated  EVALUATION COMPLEXITY: Low   GOALS: Goals reviewed with patient? Yes  SHORT TERM GOALS: Target date: 10/14/2022  Pt will be independent with HEP in order to  improve strength of UE's and  ROM of the neck in order to return to PLOF. Baseline: Not given at initial evaluation. Goal status: INITIAL    LONG TERM GOALS: Target date: 12/09/2022  Patient will demonstrate improved function as evidenced by a score of 68 on FOTO measure for full participation in activities at home and in the community. Baseline: 65 Goal status: INITIAL  2.  Pt to improve NDI by 5 points or 10% in order to demonstrate a reduction in overall disability caused by neck pain at this time. Baseline: 15/50 or 30% disability Goal status: INITIAL  3.  Pt to demonstrate improved cervical rotation to 60 deg bilaterally in order to perform tasks around the house that require turning of the head. Baseline: R/L Cervical Rotation: 49/30 Goal status: INITIAL  4.  Pt to report 0/10 pain in the neck following light household chores at home in order to demonstrate an overall reduction in pain and an improvement in QoL. Baseline: 4/10 at rest Goal status: INITIAL   PLAN:  PT FREQUENCY: 1-2x/week  PT DURATION: 12 weeks  PLANNED INTERVENTIONS: Therapeutic exercises, Therapeutic activity, Neuromuscular re-education, Balance training, Gait training, Patient/Family education, Self Care, Joint mobilization, Joint manipulation, Vestibular training, Canalith repositioning, Visual/preceptual remediation/compensation, DME instructions, Dry Needling, Cognitive remediation, Spinal manipulation, Spinal mobilization, Cryotherapy, Moist heat, Traction, Manual therapy, and Re-evaluation    PLAN FOR NEXT SESSION:    Manual therapy for neck and improve ROM.  Continue stabilization exercises for shoulder and postural related techniques.   Barrie Folk PT, DPT  Physical Therapist - [facility]  11:04 AM 09/25/22

## 2022-09-30 ENCOUNTER — Ambulatory Visit: Payer: Medicare HMO | Admitting: Occupational Therapy

## 2022-09-30 ENCOUNTER — Encounter: Payer: Medicare HMO | Admitting: Speech Pathology

## 2022-09-30 ENCOUNTER — Ambulatory Visit: Payer: Medicare HMO | Admitting: Physical Therapy

## 2022-09-30 DIAGNOSIS — M6281 Muscle weakness (generalized): Secondary | ICD-10-CM

## 2022-09-30 DIAGNOSIS — R278 Other lack of coordination: Secondary | ICD-10-CM

## 2022-09-30 DIAGNOSIS — M542 Cervicalgia: Secondary | ICD-10-CM | POA: Diagnosis not present

## 2022-09-30 NOTE — Therapy (Addendum)
OUTPATIENT OCCUPATIONAL THERAPY TREATMENT NOTE   Patient Name: Alyssa Mayo MRN: [id number] DOB:1945/12/31, 77 y.o., female Today's Date: 03/14/2022   PCP: Maryland Pink, MD REFERRING PROVIDER: Frann Rider, NP     OT End of Session - 09/30/22 1117     Visit Number 5    Number of Visits 24    Date for OT Re-Evaluation 12/09/22    Authorization Type Progress reporting period starting 09/16/2022    OT Start Time 1102    OT Stop Time 1145    OT Time Calculation (min) 43 min    Activity Tolerance Patient tolerated treatment well    Behavior During Therapy [facility] for tasks assessed/performed                                    Past Medical History:  Diagnosis Date   Abnormal levels of other serum enzymes 07/17/2015   Arthritis     Constipation 07/11/2015   COVID-19 03/06/2021   Diabetes mellitus without complication ([place])     Elevated liver enzymes     Fatty liver     Generalized abdominal pain 07/11/2015   Hyperlipidemia     Hypertension     Lifelong obesity     Macular degeneration     Morbid obesity due to excess calories ([place]) 07/11/2015   Other fatigue 07/11/2015   Sleep apnea     Spinal headache           Past Surgical History:  Procedure Laterality Date   ABDOMINAL HYSTERECTOMY       APPENDECTOMY       CATARACT EXTRACTION       CERVICAL LAMINECTOMY   07/21/1985   CESAREAN SECTION       COLONOSCOPY   07/21/2012    diverticulosis, [facility] Dr. Donnella Sham    COLONOSCOPY WITH PROPOFOL N/A 02/04/2019    Procedure: COLONOSCOPY WITH PROPOFOL;  Surgeon: Lollie Sails, MD;  Location: [facility] ENDOSCOPY;  Service: Endoscopy;  Laterality: N/A;   COLONOSCOPY WITH PROPOFOL N/A 03/18/2021    Procedure: COLONOSCOPY WITH PROPOFOL;  Surgeon: Lesly Rubenstein, MD;  Location: [facility] ENDOSCOPY;  Service: Endoscopy;  Laterality: N/A;  DM   DIAGNOSTIC LAPAROSCOPY       ESOPHAGOGASTRODUODENOSCOPY (EGD) WITH PROPOFOL N/A 02/04/2019    Procedure: ESOPHAGOGASTRODUODENOSCOPY  (EGD) WITH PROPOFOL;  Surgeon: Lollie Sails, MD;  Location: [facility] ENDOSCOPY;  Service: Endoscopy;  Laterality: N/A;   ESOPHAGOGASTRODUODENOSCOPY (EGD) WITH PROPOFOL N/A 03/18/2021    Procedure: ESOPHAGOGASTRODUODENOSCOPY (EGD) WITH PROPOFOL;  Surgeon: Lesly Rubenstein, MD;  Location: [facility] ENDOSCOPY;  Service: Endoscopy;  Laterality: N/A;   LOOP RECORDER INSERTION N/A 08/05/2021    Procedure: LOOP RECORDER INSERTION;  Surgeon: Evans Lance, MD;  Location: [place] CV LAB;  Service: Cardiovascular;  Laterality: N/A;   [place]       TUBAL LIGATION            Patient Active Problem List    Diagnosis Date Noted   Paroxysmal atrial fibrillation ([place]) 08/08/2022   Hypercoagulable state due to paroxysmal atrial fibrillation ([place]) 08/08/2022   Cerebral edema ([place]) 08/07/2021   OSA (obstructive sleep apnea) 08/07/2021   Right middle cerebral artery stroke ([place]) 08/07/2021   CVA (cerebral vascular accident) ([place]) 08/01/2021   GERD (gastroesophageal reflux disease) 07/20/2019   Advanced care planning/counseling discussion 12/17/2016   Skin lesions, generalized 11/13/2016   Chronic right hip pain 06/17/2016  Vitamin D deficiency 09/26/2015   Diastasis recti 08/08/2015   Elevated serum GGT level 07/24/2015   Essential hypertension 07/17/2015   Fatty liver 07/17/2015   Elevated alkaline phosphatase level 07/17/2015   Elevated serum glutamic pyruvic transaminase (SGPT) level 07/17/2015   Abnormal finding on EKG 07/11/2015   Morbid obesity ([place]) 07/11/2015   Colon, diverticulosis 07/11/2015   Abdominal wall hernia 07/11/2015   DM (diabetes mellitus), type 2 with neurological complications ([place])     Hyperlipidemia        ONSET DATE: 08/01/2021   REFERRING DIAG: CVA   THERAPY DIAG:  Muscle weakness (generalized)   Rationale for Evaluation and Treatment Rehabilitation   SUBJECTIVE:    SUBJECTIVE STATEMENT:    Pt. reports that she feels like the fog from [place] has  finally lifted.    Pt accompanied by: self   PERTINENT HISTORY:  Pt. is a 77 y.o. female who had a unexpectedly hospitalized from 2/12-2/14/2024 for COVID-19, and a UTI.  Pt. 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 Pt. attended inpatient rehab from 08/07/2021-08/22/2021. Pt. received Home health therapy services this past spring.  Pt. has recently had an assessment through driver rehabilitation services at [facility] with recommendations for referrals for  outpatient OT/PT, and ST services. Pt. PMHx includes: HTN, Hyperlipidemia, Macular degeneration, DM, and Obesity.   PRECAUTIONS: None   WEIGHT BEARING RESTRICTIONS No   PAIN:  Are you having pain? 0/10    FALLS: Has patient fallen in last 6 months? Yes.   LIVING ENVIRONMENT: Lives with: lives with their family  Son  Marya Mayo Lives in: House/apartment Main living are on one floor Stairs:  2 steps to enter Has following equipment at home: Single point cane, Wheelchair (manual), shower chair, Grab bars, bed rail, and rubber mat.   PLOF: Independent   PATIENT GOALS: To be able to Drive, and cook again   OBJECTIVE:    HAND DOMINANCE: Right    ADLs:  Transfers/ambulation related to ADLs: Independent Eating: Independent Grooming: [employer] Pt. reports being unable to use her left hand to perform haircare.  UB Dressing: Independent pullover shirt, independent now with fastening a bra LB Dressing: Independent donning pants socks, and slide on shoes Toileting: Independent Bathing: Independent Tub Shower transfers: Walk-in shower Independent now     IADLs: Shopping: Needs to be accompanied to the grocery store. Light housekeeping: Do  Meal Prep:  Is able to perform light meal prep Community mobility: Relies on son, and friend Medication management: Son sets up Building surveyor management:TBD Handwriting: TBD   MOBILITY STATUS: Hx of falls out of bed     ACTIVITY  TOLERANCE: Activity tolerance:  10-20 min. Before rest break   FUNCTIONAL OUTCOME MEASURES: FOTO: 61  TR score: 62   UPPER EXTREMITY ROM      Active ROM Right eval Left eval  Shoulder flexion WFL 54(74) scaption  Shoulder abduction [facility] 58(75)  Shoulder adduction      Shoulder extension      Shoulder internal rotation      Shoulder external rotation      Elbow flexion [facility] WFL  Elbow extension [facility] WFL  Wrist flexion WFL TBD  Wrist extension WFL TBD  Wrist ulnar deviation      Wrist radial deviation      Wrist pronation      Wrist supination      (Blank rows = not tested)     UPPER EXTREMITY MMT:  MMT Right eval Left eval  Shoulder flexion 4+/5 2+/5  Shoulder abduction 4+/5 2+/5  Shoulder adduction      Shoulder extension      Shoulder internal rotation      Shoulder external rotation      Middle trapezius      Lower trapezius      Elbow flexion 4+/5 3+/5  Elbow extension 4+/5 3+/5  Wrist flexion 4+/5 TBD  Wrist extension 4+/5 TBD  Wrist ulnar deviation      Wrist radial deviation      Wrist pronation      Wrist supination      (Blank rows = not tested)   HAND FUNCTION: Grip strength: Right: 33 lbs; Left: 11 lbs, Lateral pinch: Right: 17 lbs, Left: 2 lbs, and 3 point pinch: Right: 17 lbs, Left: 2 lbs   COORDINATION:   9 Hole Peg test: Right: 27 sec.  sec; Left: 52 sec.   SENSATION: Light touch: WFL Proprioception: WFL   COGNITION: Overall cognitive status: Within functional limits for tasks assessed   VISION: Subjective report: Glasses. Just received a new prescription to increase bifocal strength Baseline vision: Macular Degeneration Visual history: macular degeneration   VISION ASSESSMENT: To be further assessed in functional context  Left sided Awareness       PERCEPTION: Limited left sided awareness     TODAY'S TREATMENT:    Therapeutic Ex:  Pt. worked on [employer], and reciprocal motion using the UBE while seated for 8  min. with no resistance. Constant monitoring was provided. Pt. worked on pinch strengthening in the left hand for lateral, and 3pt. pinch using yellow, red, and green resistive clips. Pt. worked on placing the clips at various vertical and horizontal angles. Tactile and verbal cues were required for eliciting the desired movement. Pt. Worked on moving the clips from one pinch position to the other.   Neuromuscular re-ed:  Pt. worked on [facility] skills grasping 1" sticks. Pt. worked on storing the objects in the palm, and translatory skills moving the items from the palm of the hand to the tip of the 2nd digit, and thumb. Pt. worked on removing the pegs using bilateral alternating hand patterns.   PATIENT EDUCATION: Education details: Compensatory strategies during typing. Person educated: Patient Education method: verbal cues Education comprehension: verbalized understanding and pt does not plan to cook unless son is present.     HOME EXERCISE PROGRAM:  Reviewed home activities to enhance left hand digit extension  during typing skills.       GOALS: GOALS: Goals reviewed with patient? Yes     SHORT TERM GOALS: Target date: 10/28/2022     Pt. Will improve FOTO score by 2 points to reflect improved pt. perceived functional performance Baseline: FOTO: 61 TR score: 62  Goal status: INITIAL   LONG TERM GOALS: Target date: 12/09/2022    Pt. Will improve left UE strength by 2 mm grades to assist with repositioning her grandson on her lap. Baseline: Eval: left shoulder flexion, abduction 2+/5, elbow flexion, and extension 3+/5 Goal status: INITIAL   2.  Pt. will be able to independently hold and use a blow dryer, and brush for hair care. Baseline: Eval: Pt. Is unable to sustain her BUEs in elevation, and use a blow dryer, and brush. Goal status: INITIAL   3.  Pt. Will improve left lateral pinch strength by 3# to be able to independently cut meat. Baseline: Eval: 2#. Pt. has difficulty  stabilizing utensil, and  food while cutting food. Goal status:  INITIAL   4.  Pt. Will improve Left hand [facility] skills to be able to be able to independently, and efficiently manipulate small objects during ADL tasks. Baseline: Eval: Left: 52 sec. Right: 27 sec. Goal status: INITIAL     6.: Pt. will improve with left grip strength to be able to securely hold a drink.              Baseline: Eval:  Left grip strength 11#. Pt. has difficulty holding a full drink  securely with the left hand.             Goal status: INITIAL   7. Pt. Will improve typing speed, and accuracy in preparation for efficiently typing simple email message.             Baseline: Pt. Presents with difficulty typing an email. Typing speed, and accuracy TBD             Goal status: INITIAL                                        CLINICAL IMPRESSION:   Pt. reports that she is not having any pain today. Pt. continues  to require cues for hand position, and movement patterns for pinch position, and translatory movements of the hand. Pt. required visual demonstration of each task. Pt. was able to upgrade to perform lateral, and 3pt. Pinch with green resistive clips. Pt. continues to benefit from OT services in order to work towards improving, and maximizing independence with ADLs, and IADL tasks.    PERFORMANCE DEFICITS in functional skills including ADLs, IADLs, coordination, dexterity, sensation, ROM, strength, FMC, decreased knowledge of use of DME, vision, and UE functional use, cognitive skills including attention, and psychosocial skills including coping strategies, environmental adaptation, habits, and routines and behaviors.    IMPAIRMENTS are limiting patient from ADLs, IADLs, and social participation.    COMORBIDITIES may have co-morbidities  that affects occupational performance. Patient will benefit from skilled OT to address above impairments and improve overall function.   MODIFICATION OR ASSISTANCE TO COMPLETE  EVALUATION: Min-Moderate modification of tasks or assist with assess necessary to complete an evaluation.   OT OCCUPATIONAL PROFILE AND HISTORY: Detailed assessment: Review of records and additional review of physical, cognitive, psychosocial history related to current functional performance.   CLINICAL DECISION MAKING: Moderate - several treatment options, min-mod task modification necessary   REHAB POTENTIAL: Good   EVALUATION COMPLEXITY: Moderate      PLAN: OT FREQUENCY: 2x/week   OT DURATION: 12 weeks   PLANNED INTERVENTIONS: self care/ADL training, therapeutic exercise, therapeutic activity, neuromuscular re-education, manual therapy, passive range of motion, and paraffin   RECOMMENDED OTHER SERVICES: ST, and PT   CONSULTED AND AGREED WITH PLAN OF CARE: Patient   PLAN FOR NEXT SESSION:  L hand strengthening and coordination exercises   Harrel Carina, MS, OTR/L

## 2022-09-30 NOTE — Therapy (Deleted)
OUTPATIENT PHYSICAL THERAPY CERVICAL TREATMENT   Patient Name: Alyssa Mayo MRN: [id number] DOB:07/03/1946, 77 y.o., female Today's Date: 09/30/2022  END OF SESSION:       Past Medical History:  Diagnosis Date   Abnormal levels of other serum enzymes 07/17/2015   Arthritis    Constipation 07/11/2015   COVID-19 03/06/2021   Diabetes mellitus without complication ([place])    Elevated liver enzymes    Fatty liver    Generalized abdominal pain 07/11/2015   Hyperlipidemia    Hypertension    Lifelong obesity    Macular degeneration    Morbid obesity due to excess calories ([place]) 07/11/2015   Other fatigue 07/11/2015   Sleep apnea    Spinal headache    Past Surgical History:  Procedure Laterality Date   ABDOMINAL HYSTERECTOMY     APPENDECTOMY     CATARACT EXTRACTION     CERVICAL LAMINECTOMY  07/21/1985   CESAREAN SECTION     COLONOSCOPY  07/21/2012   diverticulosis, [facility] Dr. Donnella Sham    COLONOSCOPY WITH PROPOFOL N/A 02/04/2019   Procedure: COLONOSCOPY WITH PROPOFOL;  Surgeon: Lollie Sails, MD;  Location: [facility] ENDOSCOPY;  Service: Endoscopy;  Laterality: N/A;   COLONOSCOPY WITH PROPOFOL N/A 03/18/2021   Procedure: COLONOSCOPY WITH PROPOFOL;  Surgeon: Lesly Rubenstein, MD;  Location: [facility] ENDOSCOPY;  Service: Endoscopy;  Laterality: N/A;  DM   DIAGNOSTIC LAPAROSCOPY     ESOPHAGOGASTRODUODENOSCOPY (EGD) WITH PROPOFOL N/A 02/04/2019   Procedure: ESOPHAGOGASTRODUODENOSCOPY (EGD) WITH PROPOFOL;  Surgeon: Lollie Sails, MD;  Location: [facility] ENDOSCOPY;  Service: Endoscopy;  Laterality: N/A;   ESOPHAGOGASTRODUODENOSCOPY (EGD) WITH PROPOFOL N/A 03/18/2021   Procedure: ESOPHAGOGASTRODUODENOSCOPY (EGD) WITH PROPOFOL;  Surgeon: Lesly Rubenstein, MD;  Location: [facility] ENDOSCOPY;  Service: Endoscopy;  Laterality: N/A;   LOOP RECORDER INSERTION N/A 08/05/2021   Procedure: LOOP RECORDER INSERTION;  Surgeon: Evans Lance, MD;  Location: [place] CV LAB;  Service:  Cardiovascular;  Laterality: N/A;   [place]     TUBAL LIGATION     Patient Active Problem List   Diagnosis Date Noted   UTI (urinary tract infection) 09/01/2022   COVID-19 virus infection 09/01/2022   Chronic diastolic CHF (congestive heart failure) ([place]) 09/01/2022   Chronic kidney disease, stage 3a ([place]) 09/01/2022   Obesity (BMI 30-39.9) 09/01/2022   Paroxysmal atrial fibrillation ([place]) 08/08/2022   Hypercoagulable state due to paroxysmal atrial fibrillation ([place]) 08/08/2022   Cerebral edema ([place]) 08/07/2021   OSA (obstructive sleep apnea) 08/07/2021   Right middle cerebral artery stroke ([name]) 08/07/2021   CVA (cerebral vascular accident) ([place]) 08/01/2021   GERD (gastroesophageal reflux disease) 07/20/2019   Advanced care planning/counseling discussion 12/17/2016   Skin lesions, generalized 11/13/2016   Chronic right hip pain 06/17/2016   Vitamin D deficiency 09/26/2015   Diastasis recti 08/08/2015   Elevated serum GGT level 07/24/2015   Essential hypertension 07/17/2015   Fatty liver 07/17/2015   Elevated alkaline phosphatase level 07/17/2015   Elevated serum glutamic pyruvic transaminase (SGPT) level 07/17/2015   Abnormal finding on EKG 07/11/2015   Morbid obesity ([place]) 07/11/2015   Colon, diverticulosis 07/11/2015   Abdominal wall hernia 07/11/2015   DM (diabetes mellitus), type 2 with neurological complications ([place])    Hyperlipidemia     PCP: Maryland Pink, MD  REFERRING PROVIDER: Maryland Pink, MD & Scarlett Presto, [place]  REFERRING DIAG: [phone] (ICD-10-CM) - Cerebral infarction due to unspecified occlusion or stenosis of unspecified precerebral arteries, Cervicalgia with degenerative disc  THERAPY DIAG:  No diagnosis found.  Rationale for Evaluation and Treatment: Rehabilitation  ONSET DATE: 09/03/22  SUBJECTIVE:                                                                                                                                                                                                          SUBJECTIVE STATEMENT:  Pt reports that She is getting back to normal. No pain at start ot PT treatment session. Mild tightness in C spine and upper traps that is chronically present.    PERTINENT HISTORY:   Per chart and confirmed by pt: Pt is a 77 yo female with RMCA CVA infarction with hemorrhagic transformation 08/01/2021, with inpatient rehab 08/07/2021-08/22/2021 followed by [facility] therapy until June. Pt now currently being seen for outpatient OT and ST services. Pt reports limitations in stamina persist. Other PMH is significant for HTN, HLD, macular degeneration, DM, obesity, sleep apnea, arthritis, chronic R hip pain, vitamin D deficiency, diastasis recti, abdominal wall hernia, hx of abdominal hysterectomy, appendectomy, spine surgery (years ago).   PAIN:  Are you having pain? Yes: NPRS scale: 0/10 Pain location: L shoulder/neck area posteriorly Pain description: dull ache Aggravating factors: sitting Relieving factors: laying down  PRECAUTIONS: None  WEIGHT BEARING RESTRICTIONS: No  FALLS:  Has patient fallen in last 6 months? Yes. Number of falls 2  LIVING ENVIRONMENT: Lives with: lives with their son Lives in: House/apartment Stairs: Yes: Internal: 13 steps; doesn't go up to the second floor and External: 4 steps; can reach both Has following equipment at home: Single point cane and Walker - 2 wheeled  OCCUPATION: Retired  PLOF: Independent with basic ADLs  PATIENT GOALS: "To make my neck quit hurting and to prevent it from getting worse"  NEXT MD VISIT: Marena Chancy, but maybe May  OBJECTIVE:   DIAGNOSTIC FINDINGS:  No imaging done on the neck.  PATIENT SURVEYS:  NDI 15/50 = 30% disability FOTO 65/68  COGNITION: Overall cognitive status: History of cognitive impairments - at baseline  SENSATION: WFL  POSTURE: rounded shoulders and forward head  PALPATION:     CERVICAL ROM:   Active ROM A/PROM  (deg) eval  Flexion 39  Extension 10  Right lateral flexion 7  Left lateral flexion 6  Right rotation 49  Left rotation 30   (Blank rows = not tested)  UPPER EXTREMITY ROM:  Active ROM Right eval Left eval  Shoulder flexion 128 81  Shoulder abduction 160 68   (Blank rows = not tested)   UPPER EXTREMITY MMT:  Grossly decreased (3+/5) in the L  UE due to pain and lack of ROM  (Blank rows = not tested)  CERVICAL SPECIAL TESTS:  Spurling's test: Negative and Sharp pursor's test: Negative  FUNCTIONAL TESTS:  Not performed at this time.  TODAY'S TREATMENT: DATE: 09/30/22    Manual:  Supine STM to cervical region to increase extensibility of the paraspinals Supine suboccipital release technique to decrease cervicalgia Supine manual traction performed in order to increase joint space in cervical region for pain relief Supine cervical upglides/downglides, 30 sec bouts Supine UT/Levator stretch, 30 sec bouts to increase tissue extensibility of the cervical region Supine STM with TP release applied to UT's for pain modulation.  Pt requires light pressure due to being sensitive in that region. Seated STM with TP release to UT for pain modulation  Seated therex:   Mid row RTB 2 x 10 BUE High row RTB 2 x 10 BUE Thoracic extension over half bolster 2 x 10 with 2 sec hold Upper trap stretch 2 x 30 sec  Levator Scapulae stretch 2 x 30sec  Instruction from PT for improved ROM and activion of target muscle groups   PATIENT EDUCATION:  Education details: Pt educated on role of PT and services provided during current POC, along with prognosis and information about the clinic. Person educated: Patient and Son, [name] Education method: Explanation Education comprehension: verbalized understanding  HOME EXERCISE PROGRAM:  Access Code: [facility] URL: [email or web address] Date: 09/18/2022 Prepared by: Leighton Nation  Exercises - Seated Upper Trapezius Stretch  - 1  x daily - 7 x weekly - 3 sets - 10 reps - Seated Levator Scapulae Stretch  - 1 x daily - 7 x weekly - 3 sets - 10 reps - Seated Scapular Retraction  - 1 x daily - 7 x weekly - 3 sets - 10 reps  ASSESSMENT:  CLINICAL IMPRESSION:  Pt  demonstrates decreased sensitivity to UT STM on this day. Notes significant improvement in neck pain since starting PT treatment. Pt is making good progress towards current goals and improving her overall ROM.  Pt is still slightly limited by L UE due to decreased ROM and pain with mobility.   Pt will continue to benefit from skilled therapy to address remaining deficits in order to improve overall QoL and return to PLOF.        OBJECTIVE IMPAIRMENTS: decreased activity tolerance, decreased cognition, decreased ROM, decreased strength, decreased safety awareness, hypomobility, and pain.   ACTIVITY LIMITATIONS: carrying, lifting, bending, sitting, standing, squatting, and reach over head  PARTICIPATION LIMITATIONS: meal prep, cleaning, laundry, driving, shopping, community activity, and yard work  PERSONAL FACTORS: Age, Behavior pattern, Past/current experiences, Time since onset of injury/illness/exacerbation, and 3+ comorbidities: arthritis, DM2, HTN, Obesity,   are also affecting patient's functional outcome.   REHAB POTENTIAL: Good  CLINICAL DECISION MAKING: Stable/uncomplicated  EVALUATION COMPLEXITY: Low   GOALS: Goals reviewed with patient? Yes  SHORT TERM GOALS: Target date: 10/14/2022  Pt will be independent with HEP in order to improve strength of UE's and ROM of the neck in order to return to PLOF. Baseline: Not given at initial evaluation. Goal status: INITIAL    LONG TERM GOALS: Target date: 12/09/2022  Patient will demonstrate improved function as evidenced by a score of 68 on FOTO measure for full participation in activities at home and in the community. Baseline: 65 Goal status: INITIAL  2.  Pt to improve NDI by 5 points or 10%  in order to demonstrate a reduction in overall disability caused by neck pain  at this time. Baseline: 15/50 or 30% disability Goal status: INITIAL  3.  Pt to demonstrate improved cervical rotation to 60 deg bilaterally in order to perform tasks around the house that require turning of the head. Baseline: R/L Cervical Rotation: 49/30 Goal status: INITIAL  4.  Pt to report 0/10 pain in the neck following light household chores at home in order to demonstrate an overall reduction in pain and an improvement in QoL. Baseline: 4/10 at rest Goal status: INITIAL   PLAN:  PT FREQUENCY: 1-2x/week  PT DURATION: 12 weeks  PLANNED INTERVENTIONS: Therapeutic exercises, Therapeutic activity, Neuromuscular re-education, Balance training, Gait training, Patient/Family education, Self Care, Joint mobilization, Joint manipulation, Vestibular training, Canalith repositioning, Visual/preceptual remediation/compensation, DME instructions, Dry Needling, Cognitive remediation, Spinal manipulation, Spinal mobilization, Cryotherapy, Moist heat, Traction, Manual therapy, and Re-evaluation    PLAN FOR NEXT SESSION:    Manual therapy for neck and improve ROM.  Continue stabilization exercises for shoulder and postural related techniques.   Barrie Folk PT, DPT  Physical Therapist - [facility]  7:54 AM 09/30/22

## 2022-09-30 NOTE — Therapy (Signed)
OUTPATIENT PHYSICAL THERAPY CERVICAL TREATMENT   Patient Name: Alyssa Mayo MRN: [id number] DOB:10/29/1945, 77 y.o., female Today's Date: 09/30/2022  END OF SESSION:   PT End of Session - 09/30/22 1012     Visit Number 5    Number of Visits 12    Date for PT Re-Evaluation 12/09/22    Authorization Type [employer]    Progress Note Due on Visit 10    PT Start Time 1017    PT Stop Time 1059    PT Time Calculation (min) 42 min    Activity Tolerance Patient tolerated treatment well    Behavior During Therapy [facility] for tasks assessed/performed                Past Medical History:  Diagnosis Date   Abnormal levels of other serum enzymes 07/17/2015   Arthritis    Constipation 07/11/2015   COVID-19 03/06/2021   Diabetes mellitus without complication ([place])    Elevated liver enzymes    Fatty liver    Generalized abdominal pain 07/11/2015   Hyperlipidemia    Hypertension    Lifelong obesity    Macular degeneration    Morbid obesity due to excess calories ([place]) 07/11/2015   Other fatigue 07/11/2015   Sleep apnea    Spinal headache    Past Surgical History:  Procedure Laterality Date   ABDOMINAL HYSTERECTOMY     APPENDECTOMY     CATARACT EXTRACTION     CERVICAL LAMINECTOMY  07/21/1985   CESAREAN SECTION     COLONOSCOPY  07/21/2012   diverticulosis, [facility] Dr. Donnella Sham    COLONOSCOPY WITH PROPOFOL N/A 02/04/2019   Procedure: COLONOSCOPY WITH PROPOFOL;  Surgeon: Lollie Sails, MD;  Location: [facility] ENDOSCOPY;  Service: Endoscopy;  Laterality: N/A;   COLONOSCOPY WITH PROPOFOL N/A 03/18/2021   Procedure: COLONOSCOPY WITH PROPOFOL;  Surgeon: Lesly Rubenstein, MD;  Location: [facility] ENDOSCOPY;  Service: Endoscopy;  Laterality: N/A;  [place]   DIAGNOSTIC LAPAROSCOPY     ESOPHAGOGASTRODUODENOSCOPY (EGD) WITH PROPOFOL N/A 02/04/2019   Procedure: ESOPHAGOGASTRODUODENOSCOPY (EGD) WITH PROPOFOL;  Surgeon: Lollie Sails, MD;  Location: [facility] ENDOSCOPY;  Service: Endoscopy;   Laterality: N/A;   ESOPHAGOGASTRODUODENOSCOPY (EGD) WITH PROPOFOL N/A 03/18/2021   Procedure: ESOPHAGOGASTRODUODENOSCOPY (EGD) WITH PROPOFOL;  Surgeon: Lesly Rubenstein, MD;  Location: [facility] ENDOSCOPY;  Service: Endoscopy;  Laterality: N/A;   LOOP RECORDER INSERTION N/A 08/05/2021   Procedure: LOOP RECORDER INSERTION;  Surgeon: Evans Lance, MD;  Location: [place] CV LAB;  Service: Cardiovascular;  Laterality: N/A;   [place]     TUBAL LIGATION     Patient Active Problem List   Diagnosis Date Noted   UTI (urinary tract infection) 09/01/2022   COVID-19 virus infection 09/01/2022   Chronic diastolic CHF (congestive heart failure) ([place]) 09/01/2022   Chronic kidney disease, stage 3a ([place]) 09/01/2022   Obesity (BMI 30-39.9) 09/01/2022   Paroxysmal atrial fibrillation ([place]) 08/08/2022   Hypercoagulable state due to paroxysmal atrial fibrillation ([place]) 08/08/2022   Cerebral edema ([place]) 08/07/2021   OSA (obstructive sleep apnea) 08/07/2021   Right middle cerebral artery stroke ([place]) 08/07/2021   CVA (cerebral vascular accident) ([place]) 08/01/2021   GERD (gastroesophageal reflux disease) 07/20/2019   Advanced care planning/counseling discussion 12/17/2016   Skin lesions, generalized 11/13/2016   Chronic right hip pain 06/17/2016   Vitamin D deficiency 09/26/2015   Diastasis recti 08/08/2015   Elevated serum GGT level 07/24/2015   Essential hypertension 07/17/2015   Fatty liver 07/17/2015  Elevated alkaline phosphatase level 07/17/2015   Elevated serum glutamic pyruvic transaminase (SGPT) level 07/17/2015   Abnormal finding on EKG 07/11/2015   Morbid obesity ([place]) 07/11/2015   Colon, diverticulosis 07/11/2015   Abdominal wall hernia 07/11/2015   DM (diabetes mellitus), type 2 with neurological complications ([place])    Hyperlipidemia     PCP: Maryland Pink, MD  REFERRING PROVIDER: Maryland Pink, MD & Scarlett Presto, [place]  REFERRING DIAG: [phone] (ICD-10-CM) - Cerebral  infarction due to unspecified occlusion or stenosis of unspecified precerebral arteries, Cervicalgia with degenerative disc  THERAPY DIAG:  Muscle weakness (generalized)  Cervical pain  Painful cervical ROM  Rationale for Evaluation and Treatment: Rehabilitation  ONSET DATE: 09/03/22  SUBJECTIVE:                                                                                                                                                                                                         SUBJECTIVE STATEMENT:  Pt reports she is doing well but her son is having pain that they think is coming from kidney stones.    PERTINENT HISTORY:   Per chart and confirmed by pt: Pt is a 77 yo female with RMCA CVA infarction with hemorrhagic transformation 08/01/2021, with inpatient rehab 08/07/2021-08/22/2021 followed by [facility] therapy until June. Pt now currently being seen for outpatient OT and ST services. Pt reports limitations in stamina persist. Other PMH is significant for HTN, HLD, macular degeneration, DM, obesity, sleep apnea, arthritis, chronic R hip pain, vitamin D deficiency, diastasis recti, abdominal wall hernia, hx of abdominal hysterectomy, appendectomy, spine surgery (years ago).   PAIN:  Are you having pain? Yes: NPRS scale: 0/10 Pain location: L shoulder/neck area posteriorly Pain description: dull ache Aggravating factors: sitting Relieving factors: laying down  PRECAUTIONS: None  WEIGHT BEARING RESTRICTIONS: No  FALLS:  Has patient fallen in last 6 months? Yes. Number of falls 2  LIVING ENVIRONMENT: Lives with: lives with their son Lives in: House/apartment Stairs: Yes: Internal: 13 steps; doesn't go up to the second floor and External: 4 steps; can reach both Has following equipment at home: Single point cane and Walker - 2 wheeled  OCCUPATION: Retired  PLOF: Independent with basic ADLs  PATIENT GOALS: "To make my neck quit hurting and to prevent it from getting  worse"  NEXT MD VISIT: Marena Chancy, but maybe May  OBJECTIVE:   DIAGNOSTIC FINDINGS:  No imaging done on the neck.  PATIENT SURVEYS:  NDI 15/50 = 30% disability FOTO 65/68  COGNITION: Overall cognitive status: History of cognitive impairments - at  baseline  SENSATION: WFL  POSTURE: rounded shoulders and forward head  PALPATION:     CERVICAL ROM:   Active ROM A/PROM (deg) eval  Flexion 39  Extension 10  Right lateral flexion 7  Left lateral flexion 6  Right rotation 49  Left rotation 30   (Blank rows = not tested)  UPPER EXTREMITY ROM:  Active ROM Right eval Left eval  Shoulder flexion 128 81  Shoulder abduction 160 68   (Blank rows = not tested)   UPPER EXTREMITY MMT:  Grossly decreased (3+/5) in the L UE due to pain and lack of ROM  (Blank rows = not tested)  CERVICAL SPECIAL TESTS:  Spurling's test: Negative and Sharp pursor's test: Negative  FUNCTIONAL TESTS:  Not performed at this time.  TODAY'S TREATMENT: DATE: 09/30/22    Manual:  Supine STM to cervical region to increase extensibility of the paraspinals Supine suboccipital release technique to decrease cervicalgia Supine manual traction performed in order to increase joint space in cervical region for pain relief Supine cervical upglides/downglides, 30 sec bouts Supine UT/Levator stretch, 30 sec bouts to increase tissue extensibility of the cervical region Supine STM with TP release applied to UT's for pain modulation.  Seated therex:   T spine extension end scapular retraction with foam roller parallel to spine  Thoracic extension over half bolster 2 x 10 with 2 sec hold Mid row RTB 2 x 10 BUE High row RTB 2 x 10 BUE Upper trap stretch 2 x 30 sec  Levator Scapulae stretch 2 x 30sec  Wall slides x 10 with 5 sec holds bilateral, only able to get to about 90 degrees on L but full ROM on the R.    PATIENT EDUCATION:  Education details: Pt educated on role of PT and services provided  during current POC, along with prognosis and information about the clinic. Person educated: Patient and Son, [name] Education method: Explanation Education comprehension: verbalized understanding  HOME EXERCISE PROGRAM:  Access Code: [facility] URL: [email or web address] Date: 09/18/2022 Prepared by: [place] Nation  Exercises - Seated Upper Trapezius Stretch  - 1 x daily - 7 x weekly - 3 sets - 10 reps - Seated Levator Scapulae Stretch  - 1 x daily - 7 x weekly - 3 sets - 10 reps - Seated Scapular Retraction  - 1 x daily - 7 x weekly - 3 sets - 10 reps  ASSESSMENT:  CLINICAL IMPRESSION:  Continued with current plan of care as laid out in evaluation and recent prior sessions. Pt remains motivated to advance progress toward goals in order to maximize independence and safety at home. Pt requires high level assistance and cuing for completion of exercises in order to provide adequate level of stimulation challenge while minimizing pain and discomfort when possible.Pt still has increased difficulty with L UE shoulder range of motion, likely secondary to CVA. Will continue to target this and neck ROM in future sessions. Pt continues to demonstrate progress toward goals AEB progression of interventions this date either in volume or intensity.       OBJECTIVE IMPAIRMENTS: decreased activity tolerance, decreased cognition, decreased ROM, decreased strength, decreased safety awareness, hypomobility, and pain.   ACTIVITY LIMITATIONS: carrying, lifting, bending, sitting, standing, squatting, and reach over head  PARTICIPATION LIMITATIONS: meal prep, cleaning, laundry, driving, shopping, community activity, and yard work  PERSONAL FACTORS: Age, Behavior pattern, Past/current experiences, Time since onset of injury/illness/exacerbation, and 3+ comorbidities: arthritis, DM2, HTN, Obesity,   are also affecting patient's functional outcome.  REHAB POTENTIAL: Good  CLINICAL DECISION  MAKING: Stable/uncomplicated  EVALUATION COMPLEXITY: Low   GOALS: Goals reviewed with patient? Yes  SHORT TERM GOALS: Target date: 10/14/2022  Pt will be independent with HEP in order to improve strength of UE's and ROM of the neck in order to return to PLOF. Baseline: Not given at initial evaluation. Goal status: INITIAL    LONG TERM GOALS: Target date: 12/09/2022  Patient will demonstrate improved function as evidenced by a score of 68 on FOTO measure for full participation in activities at home and in the community. Baseline: 65 Goal status: INITIAL  2.  Pt to improve NDI by 5 points or 10% in order to demonstrate a reduction in overall disability caused by neck pain at this time. Baseline: 15/50 or 30% disability Goal status: INITIAL  3.  Pt to demonstrate improved cervical rotation to 60 deg bilaterally in order to perform tasks around the house that require turning of the head. Baseline: R/L Cervical Rotation: 49/30 Goal status: INITIAL  4.  Pt to report 0/10 pain in the neck following light household chores at home in order to demonstrate an overall reduction in pain and an improvement in QoL. Baseline: 4/10 at rest Goal status: INITIAL   PLAN:  PT FREQUENCY: 1-2x/week  PT DURATION: 12 weeks  PLANNED INTERVENTIONS: Therapeutic exercises, Therapeutic activity, Neuromuscular re-education, Balance training, Gait training, Patient/Family education, Self Care, Joint mobilization, Joint manipulation, Vestibular training, Canalith repositioning, Visual/preceptual remediation/compensation, DME instructions, Dry Needling, Cognitive remediation, Spinal manipulation, Spinal mobilization, Cryotherapy, Moist heat, Traction, Manual therapy, and Re-evaluation    PLAN FOR NEXT SESSION:    Manual therapy for neck and improve ROM.  Continue stabilization exercises for shoulder and postural related techniques.   [place] ,DPT Physical Therapist- [facility]   10:16 AM 09/30/22

## 2022-10-02 ENCOUNTER — Emergency Department: Payer: Medicare HMO

## 2022-10-02 ENCOUNTER — Other Ambulatory Visit: Payer: Self-pay

## 2022-10-02 ENCOUNTER — Emergency Department
Admission: EM | Admit: 2022-10-02 | Discharge: 2022-10-02 | Disposition: A | Discharge status: Home / Self Care | Payer: Medicare HMO | Attending: Emergency Medicine | Admitting: Emergency Medicine

## 2022-10-02 ENCOUNTER — Ambulatory Visit: Payer: Medicare HMO | Admitting: Physical Therapy

## 2022-10-02 ENCOUNTER — Ambulatory Visit: Payer: Medicare HMO | Admitting: Occupational Therapy

## 2022-10-02 ENCOUNTER — Encounter: Payer: Medicare HMO | Admitting: Speech Pathology

## 2022-10-02 DIAGNOSIS — W010XXA Fall on same level from slipping, tripping and stumbling without subsequent striking against object, initial encounter: Secondary | ICD-10-CM | POA: Insufficient documentation

## 2022-10-02 DIAGNOSIS — I5032 Chronic diastolic (congestive) heart failure: Secondary | ICD-10-CM | POA: Diagnosis not present

## 2022-10-02 DIAGNOSIS — N1831 Chronic kidney disease, stage 3a: Secondary | ICD-10-CM | POA: Insufficient documentation

## 2022-10-02 DIAGNOSIS — I130 Hypertensive heart and chronic kidney disease with heart failure and stage 1 through stage 4 chronic kidney disease, or unspecified chronic kidney disease: Secondary | ICD-10-CM | POA: Diagnosis not present

## 2022-10-02 DIAGNOSIS — S79911A Unspecified injury of right hip, initial encounter: Secondary | ICD-10-CM | POA: Diagnosis present

## 2022-10-02 DIAGNOSIS — S0990XA Unspecified injury of head, initial encounter: Secondary | ICD-10-CM | POA: Diagnosis not present

## 2022-10-02 DIAGNOSIS — S60811A Abrasion of right wrist, initial encounter: Secondary | ICD-10-CM | POA: Insufficient documentation

## 2022-10-02 DIAGNOSIS — E1122 Type 2 diabetes mellitus with diabetic chronic kidney disease: Secondary | ICD-10-CM | POA: Diagnosis not present

## 2022-10-02 DIAGNOSIS — S7001XA Contusion of right hip, initial encounter: Secondary | ICD-10-CM | POA: Diagnosis not present

## 2022-10-02 DIAGNOSIS — S7000XA Contusion of unspecified hip, initial encounter: Secondary | ICD-10-CM

## 2022-10-02 DIAGNOSIS — S7002XA Contusion of left hip, initial encounter: Secondary | ICD-10-CM | POA: Insufficient documentation

## 2022-10-02 DIAGNOSIS — W19XXXA Unspecified fall, initial encounter: Secondary | ICD-10-CM

## 2022-10-02 DIAGNOSIS — Z8616 Personal history of COVID-19: Secondary | ICD-10-CM | POA: Diagnosis not present

## 2022-10-02 MED ORDER — ACETAMINOPHEN 325 MG PO TABS
650.0000 mg | ORAL_TABLET | Freq: Once | ORAL | Status: AC
  Administered 2022-10-02: 650 mg via ORAL
  Filled 2022-10-02: qty 2

## 2022-10-02 NOTE — Discharge Instructions (Signed)
Your CT scans and x-ray was normal.  Please return for any new, worsening, or change in symptoms or other concerns.  It was a pleasure caring for you today.

## 2022-10-02 NOTE — ED Provider Notes (Signed)
[facility] Provider Note    Event Date/Time   First MD Initiated Contact with Patient 10/02/22 1038     (approximate)   History   Fall (Pt. To ED via EMS for mechanical fall this AM at home in the bathroom. Pt. States she stepped on something on the floor that caused her to lose her balance, and she fell. Pt. States she thinks she hit her head on the floor or wall, "but not hard." Pt. Able to stand and pivot in triage and for EMS. Pt. Has left sided deficits baseline from stroke last year. Pt. On eliquis.)   HPI  Alyssa Mayo is a 77 y.o. female with a past medical history of CVA with residual left-sided deficits, hypertension, paroxysmal atrial fibrillation on Eliquis who presents today for evaluation after a mechanical trip and fall.  Patient reports that she slipped on wet ground in her bathroom today and fell, landing on her buttocks and striking her head.  She denies loss of consciousness.  She reports that she has pain to both of her hips.  She denies headache, neck pain, nausea, vomiting, visual changes, numbness, tingling, weakness.  She reports that she has been able to stand and pivot since the fall.  No preceding symptoms.  Currently no chest pain or shortness of breath.  No abdominal pain.  She denies foul-smelling urine.  No recent sick type symptoms.  Patient Active Problem List   Diagnosis Date Noted   UTI (urinary tract infection) 09/01/2022   COVID-19 virus infection 09/01/2022   Chronic diastolic CHF (congestive heart failure) ([place]) 09/01/2022   Chronic kidney disease, stage 3a ([place]) 09/01/2022   Obesity (BMI 30-39.9) 09/01/2022   Paroxysmal atrial fibrillation ([place]) 08/08/2022   Hypercoagulable state due to paroxysmal atrial fibrillation ([place]) 08/08/2022   Cerebral edema ([place]) 08/07/2021   OSA (obstructive sleep apnea) 08/07/2021   Right middle cerebral artery stroke ([place]) 08/07/2021   CVA (cerebral vascular accident) ([place]) 08/01/2021    GERD (gastroesophageal reflux disease) 07/20/2019   Advanced care planning/counseling discussion 12/17/2016   Skin lesions, generalized 11/13/2016   Chronic right hip pain 06/17/2016   Vitamin D deficiency 09/26/2015   Diastasis recti 08/08/2015   Elevated serum GGT level 07/24/2015   Essential hypertension 07/17/2015   Fatty liver 07/17/2015   Elevated alkaline phosphatase level 07/17/2015   Elevated serum glutamic pyruvic transaminase (SGPT) level 07/17/2015   Abnormal finding on EKG 07/11/2015   Morbid obesity ([place]) 07/11/2015   Colon, diverticulosis 07/11/2015   Abdominal wall hernia 07/11/2015   DM (diabetes mellitus), type 2 with neurological complications [facility])    Hyperlipidemia           Physical Exam   Triage Vital Signs: ED Triage Vitals [10/02/22 1038]  Enc Vitals Group     BP      Pulse      Resp      Temp      Temp src      SpO2      Weight      Height      Head Circumference      Peak Flow      Pain Score 8     Pain Loc      Pain Edu?      Excl. in [place]?     Most recent vital signs: Vitals:   10/02/22 1040  BP: 137/60  Pulse: 72  Resp: 18  Temp: 98.4 F (36.9 C)  SpO2: 94%  Physical Exam Vitals and nursing note reviewed.  Constitutional:      General: Awake and alert. No acute distress.    Appearance: Normal appearance. The patient is obese.  HENT:     Head: Normocephalic and atraumatic.  No Battle sign or raccoon eyes, no hematoma or ecchymosis noted.    Mouth: Mucous membranes are moist.  Eyes:     General: PERRL. Normal EOMs        Right eye: No discharge.        Left eye: No discharge.     Conjunctiva/sclera: Conjunctivae normal.  Cardiovascular:     Rate and Rhythm: Normal rate and regular rhythm.     Pulses: Normal pulses.  Pulmonary:     Effort: Pulmonary effort is normal. No respiratory distress.     Breath sounds: Normal breath sounds.  No chest wall tenderness Abdominal:     Abdomen is soft. There is no abdominal  tenderness. No rebound or guarding. No distention. Musculoskeletal:        General: No swelling. Normal range of motion.     Cervical back: Normal range of motion and neck supple.  No midline cervical spine tenderness.  Full range of motion of neck.  Negative Spurling test.  Negative Lhermitte sign.  Normal strength and sensation in bilateral upper extremities. Normal grip strength bilaterally.  Normal intrinsic muscle function of the hand bilaterally.  Normal radial pulses bilaterally. Back: No midline vertebral tenderness. Strength and sensation 5/5 to bilateral lower extremities. Normal great toe extension against resistance. Normal sensation throughout feet. Normal patellar reflexes. Negative SLR and opposite SLR bilaterally. Negative FABER test Skin:    General: Skin is warm and dry.     Capillary Refill: Capillary refill takes less than 2 seconds.     Findings: No rash.  Superficial 0.5 cm abrasion noted to ulnar right wrist, full normal range of motion of wrist without tenderness or swelling Neurological:     Mental Status: The patient is awake and alert.   Neurological: GCS 15 alert and oriented x3 Normal speech, no expressive or receptive aphasia or dysarthria Cranial nerves II through XII intact Normal visual fields 5 out of 5 strength in all 4 extremities with intact sensation throughout No extremity drift Normal finger-to-nose testing, no limb or truncal ataxia   ED Results / Procedures / Treatments   Labs (all labs ordered are listed, but only abnormal results are displayed) Labs Reviewed - No data to display   EKG     RADIOLOGY     PROCEDURES:  Critical Care performed:   Procedures   MEDICATIONS ORDERED IN ED: Medications  acetaminophen (TYLENOL) tablet 650 mg (650 mg Oral Given 10/02/22 1142)     IMPRESSION / MDM / [place] / ED COURSE  I reviewed the triage vital signs and the nursing notes.   Differential diagnosis includes, but is  not limited to, concussion, dislocation, fracture, cervical spine injury, intracranial hemorrhage.  I reviewed the patient's chart.  Patient had a recent admission from 09/01/2022 until 09/03/2022 at which time she had weakness that was secondary to COVID-19 infection.  Patient is awake and alert, hemodynamically stable and neurologically and neurovascularly intact.  She is able to able to lift both of her legs up off of the stretcher without assistance. Negative log roll at the hip. No vertebral tenderness.  Pelvis x-ray without acute bony injury.  CT head and neck obtained per [place] criteria given that she is on Eliquis reveals no  acute intracranial hemorrhage.  Son is at bedside, reports that she is acting her normal self.  Patient denies any further complaints.  She has no other injuries.  She feels comfortable being discharged home.  She is able to ambulate with a steady gait.  Recommended close outpatient follow-up and return precautions.  Patient or stands and agrees with plan. Discharged in stable condition.  Patient's presentation is most consistent with acute complicated illness / injury requiring diagnostic workup.   FINAL CLINICAL IMPRESSION(S) / ED DIAGNOSES   Final diagnoses:  Fall, initial encounter  Injury of head, initial encounter  Contusion of hip, unspecified laterality, initial encounter     Rx / DC Orders   ED Discharge Orders     None        Note:  This document was prepared using Dragon voice recognition software and may include unintentional dictation errors.   Emeline Gins 10/02/22 1334    Vanessa Pulaski, MD 10/03/22 2158

## 2022-10-02 NOTE — ED Triage Notes (Signed)
Pt. To ED via EMS for mechanical fall this AM at home in the bathroom. Pt. States she stepped on something on the floor that caused her to lose her balance, and she fell. Pt. States she thinks she hit her head on the floor or wall, "but not hard." Pt. Able to stand and pivot in triage and for EMS. Pt. Has left sided deficits baseline from stroke last year. Pt. On eliquis.

## 2022-10-03 ENCOUNTER — Encounter: Payer: Medicare HMO | Admitting: Physical Medicine and Rehabilitation

## 2022-10-07 ENCOUNTER — Encounter: Payer: Medicare HMO | Admitting: Speech Pathology

## 2022-10-07 ENCOUNTER — Ambulatory Visit: Payer: Medicare HMO | Admitting: Occupational Therapy

## 2022-10-07 ENCOUNTER — Ambulatory Visit: Payer: Medicare HMO | Admitting: Physical Therapy

## 2022-10-09 ENCOUNTER — Ambulatory Visit: Payer: Medicare HMO | Admitting: Occupational Therapy

## 2022-10-09 ENCOUNTER — Encounter: Payer: Medicare HMO | Admitting: Speech Pathology

## 2022-10-09 ENCOUNTER — Ambulatory Visit: Payer: Medicare HMO | Admitting: Physical Therapy

## 2022-10-09 DIAGNOSIS — M542 Cervicalgia: Secondary | ICD-10-CM

## 2022-10-09 DIAGNOSIS — M6281 Muscle weakness (generalized): Secondary | ICD-10-CM

## 2022-10-09 DIAGNOSIS — R2681 Unsteadiness on feet: Secondary | ICD-10-CM

## 2022-10-09 DIAGNOSIS — R262 Difficulty in walking, not elsewhere classified: Secondary | ICD-10-CM

## 2022-10-09 DIAGNOSIS — R278 Other lack of coordination: Secondary | ICD-10-CM

## 2022-10-09 NOTE — Therapy (Signed)
OUTPATIENT PHYSICAL THERAPY CERVICAL TREATMENT   Patient Name: Ramyia Ruple MRN: [id number] DOB:10-Mar-1946, 77 y.o., female Today's Date: 10/09/2022  END OF SESSION:   PT End of Session - 10/09/22 1025     Visit Number 6    Number of Visits 12    Date for PT Re-Evaluation 12/09/22    Authorization Type [employer]    Progress Note Due on Visit 10    PT Start Time 1021    PT Stop Time 1105    PT Time Calculation (min) 44 min    Equipment Utilized During Treatment Gait belt    Activity Tolerance Patient tolerated treatment well    Behavior During Therapy WFL for tasks assessed/performed                Past Medical History:  Diagnosis Date   Abnormal levels of other serum enzymes 07/17/2015   Arthritis    Constipation 07/11/2015   COVID-19 03/06/2021   Diabetes mellitus without complication ([place])    Elevated liver enzymes    Fatty liver    Generalized abdominal pain 07/11/2015   Hyperlipidemia    Hypertension    Lifelong obesity    Macular degeneration    Morbid obesity due to excess calories ([place]) 07/11/2015   Other fatigue 07/11/2015   Sleep apnea    Spinal headache    Past Surgical History:  Procedure Laterality Date   ABDOMINAL HYSTERECTOMY     APPENDECTOMY     CATARACT EXTRACTION     CERVICAL LAMINECTOMY  07/21/1985   CESAREAN SECTION     COLONOSCOPY  07/21/2012   diverticulosis, [facility] Dr. Donnella Sham    COLONOSCOPY WITH PROPOFOL N/A 02/04/2019   Procedure: COLONOSCOPY WITH PROPOFOL;  Surgeon: Lollie Sails, MD;  Location: [facility] ENDOSCOPY;  Service: Endoscopy;  Laterality: N/A;   COLONOSCOPY WITH PROPOFOL N/A 03/18/2021   Procedure: COLONOSCOPY WITH PROPOFOL;  Surgeon: Lesly Rubenstein, MD;  Location: [facility] ENDOSCOPY;  Service: Endoscopy;  Laterality: N/A;  DM   DIAGNOSTIC LAPAROSCOPY     ESOPHAGOGASTRODUODENOSCOPY (EGD) WITH PROPOFOL N/A 02/04/2019   Procedure: ESOPHAGOGASTRODUODENOSCOPY (EGD) WITH PROPOFOL;  Surgeon: Lollie Sails,  MD;  Location: [facility] ENDOSCOPY;  Service: Endoscopy;  Laterality: N/A;   ESOPHAGOGASTRODUODENOSCOPY (EGD) WITH PROPOFOL N/A 03/18/2021   Procedure: ESOPHAGOGASTRODUODENOSCOPY (EGD) WITH PROPOFOL;  Surgeon: Lesly Rubenstein, MD;  Location: [facility] ENDOSCOPY;  Service: Endoscopy;  Laterality: N/A;   LOOP RECORDER INSERTION N/A 08/05/2021   Procedure: LOOP RECORDER INSERTION;  Surgeon: Evans Lance, MD;  Location: [place] CV LAB;  Service: Cardiovascular;  Laterality: N/A;   [place]     TUBAL LIGATION     Patient Active Problem List   Diagnosis Date Noted   UTI (urinary tract infection) 09/01/2022   COVID-19 virus infection 09/01/2022   Chronic diastolic CHF (congestive heart failure) ([place]) 09/01/2022   Chronic kidney disease, stage 3a ([place]) 09/01/2022   Obesity (BMI 30-39.9) 09/01/2022   Paroxysmal atrial fibrillation ([place]) 08/08/2022   Hypercoagulable state due to paroxysmal atrial fibrillation ([place]) 08/08/2022   Cerebral edema ([place]) 08/07/2021   OSA (obstructive sleep apnea) 08/07/2021   Right middle cerebral artery stroke ([place]) 08/07/2021   CVA (cerebral vascular accident) ([place]) 08/01/2021   GERD (gastroesophageal reflux disease) 07/20/2019   Advanced care planning/counseling discussion 12/17/2016   Skin lesions, generalized 11/13/2016   Chronic right hip pain 06/17/2016   Vitamin D deficiency 09/26/2015   Diastasis recti 08/08/2015   Elevated serum GGT level 07/24/2015   Essential  hypertension 07/17/2015   Fatty liver 07/17/2015   Elevated alkaline phosphatase level 07/17/2015   Elevated serum glutamic pyruvic transaminase (SGPT) level 07/17/2015   Abnormal finding on EKG 07/11/2015   Morbid obesity ([place]) 07/11/2015   Colon, diverticulosis 07/11/2015   Abdominal wall hernia 07/11/2015   DM (diabetes mellitus), type 2 with neurological complications ([place])    Hyperlipidemia     PCP: Maryland Pink, MD  REFERRING PROVIDER: Maryland Pink, MD & Scarlett Presto,  [place]  REFERRING DIAG: [phone] (ICD-10-CM) - Cerebral infarction due to unspecified occlusion or stenosis of unspecified precerebral arteries, Cervicalgia with degenerative disc  THERAPY DIAG:  Muscle weakness (generalized)  Cervical pain  Painful cervical ROM  Difficulty in walking, not elsewhere classified  Rationale for Evaluation and Treatment: Rehabilitation  ONSET DATE: 09/03/22  SUBJECTIVE:                                                                                                                                                                                                         SUBJECTIVE STATEMENT:  Pt reports fall on 10/02/22. No LOC. Was seen by EMT and taken to [facility] for assessment. No significant changes in xray or CT scan for neck/head/pelvis   PERTINENT HISTORY:   Per chart and confirmed by pt: Pt is a 77 yo female with RMCA CVA infarction with hemorrhagic transformation 08/01/2021, with inpatient rehab 08/07/2021-08/22/2021 followed by [facility] therapy until June. Pt now currently being seen for outpatient OT and ST services. Pt reports limitations in stamina persist. Other PMH is significant for HTN, HLD, macular degeneration, DM, obesity, sleep apnea, arthritis, chronic R hip pain, vitamin D deficiency, diastasis recti, abdominal wall hernia, hx of abdominal hysterectomy, appendectomy, spine surgery (years ago).   PAIN:  Are you having pain? Yes: NPRS scale: 0/10 Pain location: L shoulder/neck area posteriorly Pain description: dull ache Aggravating factors: sitting Relieving factors: laying down  PRECAUTIONS: None  WEIGHT BEARING RESTRICTIONS: No  FALLS:  Has patient fallen in last 6 months? Yes. Number of falls 2  LIVING ENVIRONMENT: Lives with: lives with their son Lives in: House/apartment Stairs: Yes: Internal: 13 steps; doesn't go up to the second floor and External: 4 steps; can reach both Has following equipment at home: Single point cane and Walker - 2  wheeled  OCCUPATION: Retired  PLOF: Independent with basic ADLs  PATIENT GOALS: "To make my neck quit hurting and to prevent it from getting worse"  NEXT MD VISIT: Marena Chancy, but maybe May  OBJECTIVE:   DIAGNOSTIC FINDINGS:    CT HEAD WITHOUT CONTRAST  CT CERVICAL SPINE WITHOUT CONTRAST IMPRESSION: 1. No acute intracranial pathology. 2. Expected interval evolution of encephalomalacia of the right frontal lobe, following intraparenchymal hemorrhage seen acutely on examination dated 08/03/2021. 3. No fracture or static subluxation of the cervical spine. 4. Generally mild disc space height loss and osteophytosis throughout the cervical spine, with partial ankylosis of C5-C6 and a large left eccentric posterior bridging osteophyte at this level. MRI may be used to further evaluate cervical disc and neural foraminal pathology if indicated by neurologically localizing signs and symptoms.  EXAM: xRay PELVIS - 1-2 VIEW  IMPRESSION: 1. No convincing evidence of acute fracture or diastasis of the pelvis. 2. Well corticated osseous focus beneath the right inferior pubic ramus may reflect sequela of avulsion injury or chronic tendinopathy, suggest correlation with direct tenderness to palpation.   PATIENT SURVEYS:  NDI 15/50 = 30% disability FOTO 65/68  COGNITION: Overall cognitive status: History of cognitive impairments - at baseline  SENSATION: WFL  POSTURE: rounded shoulders and forward head  PALPATION:     CERVICAL ROM:   Active ROM A/PROM (deg) eval  Flexion 39  Extension 10  Right lateral flexion 7  Left lateral flexion 6  Right rotation 49  Left rotation 30   (Blank rows = not tested)  UPPER EXTREMITY ROM:  Active ROM Right eval Left eval  Shoulder flexion 128 81  Shoulder abduction 160 68   (Blank rows = not tested)   UPPER EXTREMITY MMT:  Grossly decreased (3+/5) in the L UE due to pain and lack of ROM  (Blank rows = not  tested)  CERVICAL SPECIAL TESTS:  Spurling's test: Negative and Sharp pursor's test: Negative  FUNCTIONAL TESTS:  Not performed at this time.  TODAY'S TREATMENT: DATE: 10/09/22  PT assessed Cervical ROM. Pt reports significantly reduced pain in the Neck, but increased Buttock, L hip, and low back pain Following fall on   10/02/22  Active ROM A/PROM (deg) eval 10/09/22  Flexion 39 25  Extension 10 24  Right lateral flexion 7 15  Left lateral flexion 6 20  Right rotation 49 53  Left rotation 30 42   (Blank rows = not tested)    LOWER EXTREMITY MMT:    MMT Right 3/21 Left 3/21  Hip flexion 4+ 4-  Hip extension 4+ 4-  Hip abduction 4+ 4  Hip adduction 4+ 4  Hip internal rotation    Hip external rotation    Knee flexion 4+ 4-  Knee extension 4+ 4  Ankle dorsiflexion 4+ 4  Ankle plantarflexion    Ankle inversion    Ankle eversion     (Blank rows = not tested)  LOWER EXTREMITY ROM:     Passive  Right 3/21 Left 3/21  Hip flexion 120 105  Hip extension    Hip abduction    Hip adduction    Hip internal rotation    Hip external rotation    Knee flexion [facility] WFL  Knee extension (in Hip flexion 90deg) 143 120  Ankle dorsiflexion    Ankle plantarflexion    Ankle inversion    Ankle eversion     (Blank rows = not tested)   Gait training with SPC 2 x 44ft with supervision assist. Pt noted to have decreased step height, decreased stance time, decreased step length on the LLE compared to the RLE on this day. Pt also reports pain in lateral/posterior hip with movement.   Pt tender to palpation on the lateral and posterior aspect of  the L hip with greatest tenderness at ischial tuberosity. With transfers, pt also reports pain at Sacrum and L SI joint.   Bed mobility sit<>supine performed with min assist from PT for management of the LLE due to weakness at pain.  Pt noted to  also have reduced shoulder ROM on this day. OT notified for continued assessment and strength and  ROM deficits.   LOWER EXTREMITY SPECIAL TESTS:  Hip special tests: Saralyn Pilar (FABER) test: positive , Hip scouring test: negative, and distraction: negative     PATIENT EDUCATION:  Education details: Pt educated on role of PT and services provided during current POC, along with prognosis and information about the clinic. Need for Modified PT referral to address new L hip and low back pain. Person educated: Patient and   Education method: Explanation Education comprehension: verbalized understanding  HOME EXERCISE PROGRAM:  Access Code: [email or web address] URL: [email or web address] Date: 09/18/2022 Prepared by: [place]  Exercises - Seated Upper Trapezius Stretch  - 1 x daily - 7 x weekly - 3 sets - 10 reps - Seated Levator Scapulae Stretch  - 1 x daily - 7 x weekly - 3 sets - 10 reps - Seated Scapular Retraction  - 1 x daily - 7 x weekly - 3 sets - 10 reps  ASSESSMENT:  CLINICAL IMPRESSION:  Pt presented to PT for first time since fall on 10/02/22. Pt reports that neck pain and stiffness has continued to improve, but greatest limiting impairment is pain and weakness in the LLE. PT performed general assessment of Pain and mobility through the Cervical spine and LLE. See above for details. Noted decreased strength and ROM as well as increased dependence with functional mobility including gait, transfers, and bed mobility secondary to pain from fall. No acute abnormality found in xray of pelvis on 10/02/22.  Pt would benefit from skilled PT to continue to improve cervical ROm as well as address impairments of the LLE for main management and improved strength and function to improve QoL and reduce dependence on husband as caregiver.        OBJECTIVE IMPAIRMENTS: decreased activity tolerance, decreased cognition, decreased ROM, decreased strength, decreased safety awareness, hypomobility, and pain.   ACTIVITY LIMITATIONS: carrying, lifting, bending, sitting, standing, squatting,  and reach over head  PARTICIPATION LIMITATIONS: meal prep, cleaning, laundry, driving, shopping, community activity, and yard work  PERSONAL FACTORS: Age, Behavior pattern, Past/current experiences, Time since onset of injury/illness/exacerbation, and 3+ comorbidities: arthritis, DM2, HTN, Obesity,   are also affecting patient's functional outcome.   REHAB POTENTIAL: Good  CLINICAL DECISION MAKING: Stable/uncomplicated  EVALUATION COMPLEXITY: Low   GOALS: Goals reviewed with patient? Yes  SHORT TERM GOALS: Target date: 10/14/2022  Pt will be independent with HEP in order to improve strength of UE's and ROM of the neck in order to return to PLOF. Baseline: Not given at initial evaluation. Goal status: INITIAL    LONG TERM GOALS: Target date: 12/09/2022  Patient will demonstrate improved function as evidenced by a score of 68 on FOTO measure for full participation in activities at home and in the community. Baseline: 65 Goal status: INITIAL  2.  Pt to improve NDI by 5 points or 10% in order to demonstrate a reduction in overall disability caused by neck pain at this time. Baseline: 15/50 or 30% disability Goal status: INITIAL  3.  Pt to demonstrate improved cervical rotation to 60 deg bilaterally in order to perform tasks around the house that require turning of the head. Baseline:  R/L Cervical Rotation: 49/30 Goal status: INITIAL  4.  Pt to report 0/10 pain in the neck following light household chores at home in order to demonstrate an overall reduction in pain and an improvement in QoL. Baseline: 4/10 at rest Goal status: INITIAL   PLAN:  PT FREQUENCY: 1-2x/week  PT DURATION: 12 weeks  PLANNED INTERVENTIONS: Therapeutic exercises, Therapeutic activity, Neuromuscular re-education, Balance training, Gait training, Patient/Family education, Self Care, Joint mobilization, Joint manipulation, Vestibular training, Canalith repositioning, Visual/preceptual  remediation/compensation, DME instructions, Dry Needling, Cognitive remediation, Spinal manipulation, Spinal mobilization, Cryotherapy, Moist heat, Traction, Manual therapy, and Re-evaluation    PLAN FOR NEXT SESSION:    Continue to address cervical ROM and strength deficits. Continue to address L hip and low back pain provided the modified referral has been obtained by MD.    Lorie Phenix PT ,DPT Physical Therapist- [facility]   10:33 AM 10/09/22

## 2022-10-09 NOTE — Therapy (Signed)
OUTPATIENT OCCUPATIONAL THERAPY RE-EVALUATION NOTE   Patient Name: Alyssa Mayo MRN: [id number] DOB:19-Mar-1946, 77 y.o., female Today's Date: 03/14/2022   PCP: Alyssa Pink, MD REFERRING PROVIDER: Frann Rider, NP     OT End of Session - 10/09/22 1208     Visit Number 6    Number of Visits 24    Date for OT Re-Evaluation 12/09/22    Authorization Type Progress reporting period starting 09/16/2022    OT Start Time 1105    OT Stop Time 1145    OT Time Calculation (min) 40 min    Activity Tolerance Patient tolerated treatment well    Behavior During Therapy [facility] for tasks assessed/performed                                    Past Medical History:  Diagnosis Date   Abnormal levels of other serum enzymes 07/17/2015   Arthritis     Constipation 07/11/2015   COVID-19 03/06/2021   Diabetes mellitus without complication ([place])     Elevated liver enzymes     Fatty liver     Generalized abdominal pain 07/11/2015   Hyperlipidemia     Hypertension     Lifelong obesity     Macular degeneration     Morbid obesity due to excess calories ([place]) 07/11/2015   Other fatigue 07/11/2015   Sleep apnea     Spinal headache           Past Surgical History:  Procedure Laterality Date   ABDOMINAL HYSTERECTOMY       APPENDECTOMY       CATARACT EXTRACTION       CERVICAL LAMINECTOMY   07/21/1985   CESAREAN SECTION       COLONOSCOPY   07/21/2012    diverticulosis, [facility] Dr. Donnella Sham    COLONOSCOPY WITH PROPOFOL N/A 02/04/2019    Procedure: COLONOSCOPY WITH PROPOFOL;  Surgeon: Lollie Sails, MD;  Location: [facility] ENDOSCOPY;  Service: Endoscopy;  Laterality: N/A;   COLONOSCOPY WITH PROPOFOL N/A 03/18/2021    Procedure: COLONOSCOPY WITH PROPOFOL;  Surgeon: Lesly Rubenstein, MD;  Location: [facility] ENDOSCOPY;  Service: Endoscopy;  Laterality: N/A;  DM   DIAGNOSTIC LAPAROSCOPY       ESOPHAGOGASTRODUODENOSCOPY (EGD) WITH PROPOFOL N/A 02/04/2019    Procedure:  ESOPHAGOGASTRODUODENOSCOPY (EGD) WITH PROPOFOL;  Surgeon: Lollie Sails, MD;  Location: [facility] ENDOSCOPY;  Service: Endoscopy;  Laterality: N/A;   ESOPHAGOGASTRODUODENOSCOPY (EGD) WITH PROPOFOL N/A 03/18/2021    Procedure: ESOPHAGOGASTRODUODENOSCOPY (EGD) WITH PROPOFOL;  Surgeon: Lesly Rubenstein, MD;  Location: [facility] ENDOSCOPY;  Service: Endoscopy;  Laterality: N/A;   LOOP RECORDER INSERTION N/A 08/05/2021    Procedure: LOOP RECORDER INSERTION;  Surgeon: Evans Lance, MD;  Location: [facility];  Service: Cardiovascular;  Laterality: N/A;   [place]       TUBAL LIGATION            Patient Active Problem List    Diagnosis Date Noted   Paroxysmal atrial fibrillation ([place]) 08/08/2022   Hypercoagulable state due to paroxysmal atrial fibrillation ([place]) 08/08/2022   Cerebral edema ([place]) 08/07/2021   OSA (obstructive sleep apnea) 08/07/2021   Right middle cerebral artery stroke ([place]) 08/07/2021   CVA (cerebral vascular accident) ([place]) 08/01/2021   GERD (gastroesophageal reflux disease) 07/20/2019   Advanced care planning/counseling discussion 12/17/2016   Skin lesions, generalized 11/13/2016   Chronic right hip pain 06/17/2016  Vitamin D deficiency 09/26/2015   Diastasis recti 08/08/2015   Elevated serum GGT level 07/24/2015   Essential hypertension 07/17/2015   Fatty liver 07/17/2015   Elevated alkaline phosphatase level 07/17/2015   Elevated serum glutamic pyruvic transaminase (SGPT) level 07/17/2015   Abnormal finding on EKG 07/11/2015   Morbid obesity ([place]) 07/11/2015   Colon, diverticulosis 07/11/2015   Abdominal wall hernia 07/11/2015   DM (diabetes mellitus), type 2 with neurological complications ([place])     Hyperlipidemia        ONSET DATE: 08/01/2021   REFERRING DIAG: CVA   THERAPY DIAG:  Muscle weakness (generalized)   Rationale for Evaluation and Treatment Rehabilitation   SUBJECTIVE:    SUBJECTIVE STATEMENT:    Pt. R had a fall in her bathroom.     Pt accompanied by: self   PERTINENT HISTORY:  Pt. is a 77 y.o. female who had a unexpectedly hospitalized from 2/12-2/14/2024 for COVID-19, and a UTI.  Pt. 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 Pt. attended inpatient rehab from 08/07/2021-08/22/2021. Pt. received Home health therapy services this past spring.  Pt. has recently had an assessment through driver rehabilitation services at [facility] with recommendations for referrals for  outpatient OT/PT, and ST services. Pt. PMHx includes: HTN, Hyperlipidemia, Macular degeneration, DM, and Obesity.   PRECAUTIONS: None   WEIGHT BEARING RESTRICTIONS No   PAIN:  Are you having pain? 0/10    FALLS: Has patient fallen in last 6 months? Yes.   LIVING ENVIRONMENT: Lives with: lives with their family  Son  Marya Mayo Lives in: House/apartment Main living are on one floor Stairs:  2 steps to enter Has following equipment at home: Single point cane, Wheelchair (manual), shower chair, Grab bars, bed rail, and rubber mat.   PLOF: Independent   PATIENT GOALS: To be able to Drive, and cook again   OBJECTIVE:    HAND DOMINANCE: Right    ADLs:  Transfers/ambulation related to ADLs: Independent Eating: Independent Grooming: [employer] Pt. reports being unable to use her left hand to perform haircare.  UB Dressing: Independent pullover shirt, independent now with fastening a bra LB Dressing: Independent donning pants socks, and slide on shoes Toileting: Independent Bathing: Independent Tub Shower transfers: Walk-in shower Independent now     IADLs: Shopping: Needs to be accompanied to the grocery store. Light housekeeping: Do  Meal Prep:  Is able to perform light meal prep Community mobility: Relies on son, and friend Medication management: Son sets up Building surveyor management:TBD Handwriting: TBD   MOBILITY STATUS: Hx of falls out of bed     ACTIVITY TOLERANCE: Activity  tolerance:  10-20 min. Before rest break   FUNCTIONAL OUTCOME MEASURES: FOTO: 61  TR score: 62  10/09/2022: FOTO: 49   UPPER EXTREMITY ROM      Active ROM Right eval Left eval Left  10/09/2022  Shoulder flexion WFL 54(74) scaption 0(55)  Shoulder abduction WFL 58(75) 32(54)  Shoulder adduction       Shoulder extension       Shoulder internal rotation       Shoulder external rotation       Elbow flexion [facility] WFL 145  Elbow extension [facility] WFL 10(0)  Wrist flexion WFL TBD WFL  Wrist extension WFL TBD WFL  Wrist ulnar deviation       Wrist radial deviation       Wrist pronation       Wrist supination       (  Blank rows = not tested)     UPPER EXTREMITY MMT:      MMT Right eval Left eval Left 10/09/2022  Shoulder flexion 4+/5 2+/5 1/5  Shoulder abduction 4+/5 2+/5 2-/5  Shoulder adduction       Shoulder extension       Shoulder internal rotation       Shoulder external rotation       Middle trapezius       Lower trapezius       Elbow flexion 4+/5 3+/5 3/5  Elbow extension 4+/5 3+/5 3/5  Wrist flexion 4+/5 TBD 3/5  Wrist extension 4+/5 TBD 3/5  Wrist ulnar deviation       Wrist radial deviation       Wrist pronation       Wrist supination       (Blank rows = not tested)   HAND FUNCTION: Grip strength: Right: 33 lbs; Left: 11 lbs, Lateral pinch: Right: 17 lbs, Left: 2 lbs, and 3 point pinch: Right: 17 lbs, Left: 2 lbs  10/09/2022: Grip strength: Right: 33 lbs; Left: 10 lbs, Lateral pinch: Right: 17 lbs, Left: 1 lbs, and 3 point pinch: Right: 17 lbs, Left: 1 lbs   COORDINATION:   9 Hole Peg test: Right: 27 sec.  sec; Left: 52 sec.  10/09/2022: 9 Hole Peg test: Right: 27 sec.  sec; Left: 1 min. & 35 sec.   SENSATION: Light touch: WFL Proprioception: WFL   COGNITION: Overall cognitive status: Within functional limits for tasks assessed   VISION: Subjective report: Glasses. Just received a new prescription to increase bifocal strength Baseline vision: Macular  Degeneration Visual history: macular degeneration   VISION ASSESSMENT: To be further assessed in functional context  Left sided Awareness      PERCEPTION: Limited left sided awareness     TODAY'S TREATMENT:    Pt. has had recent ER visit 2/2 a fall. Pt. reports 5/10 hip pain, and LLUE pain. Measurements were obtained, and goals were reviewed with the Pt. Pt. Presents with decreased left shoulder ROM, and weakness which limites her ability to engage in, and perform basic ADL, and IADL functioning.Pt. presents with difficulty using her left hand to perform self-grooming, and haircare tasks. Pt. Has difficulty accessing her axilla region when washing, and applying deodorant. Pt. Reports difficulty pulling up her covers with the left hand, and donning a bra. FOTO score is 49. Pt. requires continued OT services to  work towards improving L functioning in order to maximize engagement, and independence with these tasks. Goals have been modified.   PATIENT EDUCATION: Education details: Compensatory strategies during typing. Person educated: Patient Education method: verbal cues Education comprehension: verbalized understanding and pt does not plan to cook unless son is present.     HOME EXERCISE PROGRAM:  Reviewed home activities to enhance left hand digit extension  during typing skills.       GOALS: GOALS: Goals reviewed with patient? Yes     SHORT TERM GOALS: Target date: 10/28/2022     Pt. Will improve FOTO score by 2 points to reflect improved pt. perceived functional performance Baseline:  10/09/2022: FOTO 49, FOTO: 61 TR score: 62  Goal status: INITIAL   LONG TERM GOALS: Target date: 12/09/2022    Pt. Will improve left shoulder ROM by 10 degrees to be able able to efficiently apply deodorant Baseline: 10/09/2022: flexion: 0(55), Abduction: 32(54) Goal status:Revised from strength to ROM   2.  Pt. will be able to independently hold  and use a blow dryer, and brush for hair  care. Baseline: 10/09/2022: Pt. Is unable to hold a blow dryer. Eval: Pt. Is unable to sustain her BUEs in elevation, and use a blow dryer, and brush. Goal status: Ongoing   3.  Pt. Will improve left lateral pinch strength by 3# to be able to independently cut meat. Baseline: 10/09/2022: 1#  Eval: 2#. Pt. has difficulty stabilizing utensil, and food while cutting food. Goal status: Ongoing   4.  Pt. Will improve Left hand [facility] skills to be able to be able to independently, and efficiently manipulate small objects during ADL tasks. Baseline 10/09/2022: Left: 1 min & 35 sec.: Eval: Left: 52 sec. Right: 27 sec. Goal status: Ongoing     6.: Pt. will improve with left grip strength to be able to hold and pull up covers.             Baseline: 10/09/2022: Left: 10# Eval:  Left grip strength 11#. Pt. has difficulty holding a full drink  securely with the left hand.             Goal status: INITIAL   7. Pt. Will improve typing speed, and accuracy in preparation for efficiently typing simple email message. Baseline: 10/09/2022: Pt. presents with difficulty typing. Pt. presents with difficulty typing an email. Typing speed, and accuracy TBD             Goal status: INITIAL                                        CLINICAL IMPRESSION:  Pt. has had recent ER visit 2/2 a fall. Pt. reports 5/10 hip pain, and LLUE pain. Measurements were obtained, and goals were reviewed with the Pt. Pt. Presents with decreased left shoulder ROM, and weakness which limites her ability to engage in, and perform basic ADL, and IADL functioning.Pt. presents with difficulty using her left hand to perform self-grooming, and haircare tasks. Pt. Has difficulty accessing her axilla region when washing, and applying deodorant. Pt. Reports difficulty pulling up her covers with the left hand, and donning a bra. FOTO score is 49. Pt. requires continued OT services to  work towards improving L functioning in order to maximize engagement, and  independence with these tasks. Goals have been modified.     Marland Kitchen    PERFORMANCE DEFICITS in functional skills including ADLs, IADLs, coordination, dexterity, sensation, ROM, strength, FMC, decreased knowledge of use of DME, vision, and UE functional use, cognitive skills including attention, and psychosocial skills including coping strategies, environmental adaptation, habits, and routines and behaviors.    IMPAIRMENTS are limiting patient from ADLs, IADLs, and social participation.    COMORBIDITIES may have co-morbidities  that affects occupational performance. Patient will benefit from skilled OT to address above impairments and improve overall function.   MODIFICATION OR ASSISTANCE TO COMPLETE EVALUATION: Min-Moderate modification of tasks or assist with assess necessary to complete an evaluation.   OT OCCUPATIONAL PROFILE AND HISTORY: Detailed assessment: Review of records and additional review of physical, cognitive, psychosocial history related to current functional performance.   CLINICAL DECISION MAKING: Moderate - several treatment options, min-mod task modification necessary   REHAB POTENTIAL: Good   EVALUATION COMPLEXITY: Moderate      PLAN: OT FREQUENCY: 2x/week   OT DURATION: 12 weeks   PLANNED INTERVENTIONS: self care/ADL training, therapeutic exercise, therapeutic activity, neuromuscular re-education, manual therapy,  passive range of motion, and paraffin   RECOMMENDED OTHER SERVICES: ST, and PT   CONSULTED AND AGREED WITH PLAN OF CARE: Patient   PLAN FOR NEXT SESSION:  L hand strengthening and coordination exercises   Harrel Carina, MS, OTR/L

## 2022-10-14 ENCOUNTER — Ambulatory Visit: Payer: Medicare HMO | Admitting: Occupational Therapy

## 2022-10-14 ENCOUNTER — Ambulatory Visit: Payer: Medicare HMO | Admitting: Physical Therapy

## 2022-10-14 DIAGNOSIS — M6281 Muscle weakness (generalized): Secondary | ICD-10-CM

## 2022-10-14 DIAGNOSIS — M542 Cervicalgia: Secondary | ICD-10-CM | POA: Diagnosis not present

## 2022-10-14 DIAGNOSIS — R278 Other lack of coordination: Secondary | ICD-10-CM

## 2022-10-14 DIAGNOSIS — M25552 Pain in left hip: Secondary | ICD-10-CM

## 2022-10-14 DIAGNOSIS — R262 Difficulty in walking, not elsewhere classified: Secondary | ICD-10-CM

## 2022-10-14 DIAGNOSIS — R2681 Unsteadiness on feet: Secondary | ICD-10-CM

## 2022-10-14 NOTE — Therapy (Signed)
OUTPATIENT OCCUPATIONAL THERAPY RE-EVALUATION NOTE   Patient Name: Alyssa Mayo MRN: [id number] DOB:[birth year], 77 y.o., female Today's Date: 03/14/2022   PCP: [place] Pink, MD REFERRING PROVIDER: Frann Rider, NP     OT End of Session - 10/09/22 1208     Visit Number 6    Number of Visits 24    Date for OT Re-Evaluation 12/09/22    Authorization Type Progress reporting period starting 09/16/2022    OT Start Time 1105    OT Stop Time 1145    OT Time Calculation (min) 40 min    Activity Tolerance Patient tolerated treatment well    Behavior During Therapy [facility] for tasks assessed/performed                                    Past Medical History:  Diagnosis Date   Abnormal levels of other serum enzymes 07/17/2015   Arthritis     Constipation 07/11/2015   COVID-19 03/06/2021   Diabetes mellitus without complication ([place])     Elevated liver enzymes     Fatty liver     Generalized abdominal pain 07/11/2015   Hyperlipidemia     Hypertension     Lifelong obesity     Macular degeneration     Morbid obesity due to excess calories ([place]) 07/11/2015   Other fatigue 07/11/2015   Sleep apnea     Spinal headache           Past Surgical History:  Procedure Laterality Date   ABDOMINAL HYSTERECTOMY       APPENDECTOMY       CATARACT EXTRACTION       CERVICAL LAMINECTOMY   07/21/1985   CESAREAN SECTION       COLONOSCOPY   07/21/2012    diverticulosis, [facility] Dr. Donnella Sham    COLONOSCOPY WITH PROPOFOL N/A 02/04/2019    Procedure: COLONOSCOPY WITH PROPOFOL;  Surgeon: Lollie Sails, MD;  Location: [facility] ENDOSCOPY;  Service: Endoscopy;  Laterality: N/A;   COLONOSCOPY WITH PROPOFOL N/A 03/18/2021    Procedure: COLONOSCOPY WITH PROPOFOL;  Surgeon: Lesly Rubenstein, MD;  Location: [facility] ENDOSCOPY;  Service: Endoscopy;  Laterality: N/A;  DM   DIAGNOSTIC LAPAROSCOPY       ESOPHAGOGASTRODUODENOSCOPY (EGD) WITH PROPOFOL N/A 02/04/2019    Procedure:  ESOPHAGOGASTRODUODENOSCOPY (EGD) WITH PROPOFOL;  Surgeon: Lollie Sails, MD;  Location: [facility] ENDOSCOPY;  Service: Endoscopy;  Laterality: N/A;   ESOPHAGOGASTRODUODENOSCOPY (EGD) WITH PROPOFOL N/A 03/18/2021    Procedure: ESOPHAGOGASTRODUODENOSCOPY (EGD) WITH PROPOFOL;  Surgeon: Lesly Rubenstein, MD;  Location: [facility] ENDOSCOPY;  Service: Endoscopy;  Laterality: N/A;   LOOP RECORDER INSERTION N/A 08/05/2021    Procedure: LOOP RECORDER INSERTION;  Surgeon: Evans Lance, MD;  Location: [place] CV LAB;  Service: Cardiovascular;  Laterality: N/A;   [place]       TUBAL LIGATION            Patient Active Problem List    Diagnosis Date Noted   Paroxysmal atrial fibrillation ([place]) 08/08/2022   Hypercoagulable state due to paroxysmal atrial fibrillation ([place]) 08/08/2022   Cerebral edema ([place]) 08/07/2021   OSA (obstructive sleep apnea) 08/07/2021   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/07/2021   CVA (cerebral vascular accident) ([place]) 08/01/2021   GERD (gastroesophageal reflux disease) 07/20/2019   Advanced care planning/counseling discussion 12/17/2016   Skin lesions, generalized 11/13/2016   Chronic right hip pain 06/17/2016  Vitamin D deficiency 09/26/2015   Diastasis recti 08/08/2015   Elevated serum GGT level 07/24/2015   Essential hypertension 07/17/2015   Fatty liver 07/17/2015   Elevated alkaline phosphatase level 07/17/2015   Elevated serum glutamic pyruvic transaminase (SGPT) level 07/17/2015   Abnormal finding on EKG 07/11/2015   Morbid obesity ([place]) 07/11/2015   Colon, diverticulosis 07/11/2015   Abdominal wall hernia 07/11/2015   DM (diabetes mellitus), type 2 with neurological complications ([place])     Hyperlipidemia        ONSET DATE: 08/01/2021   REFERRING DIAG: CVA   THERAPY DIAG:  Muscle weakness (generalized)   Rationale for Evaluation and Treatment Rehabilitation   SUBJECTIVE:    SUBJECTIVE STATEMENT:    Pt. Reports getting stuck in  traffic.   Pt accompanied by: self   PERTINENT HISTORY:  Pt. is a 76 y.o. female who had a unexpectedly hospitalized from 2/12-2/14/2024 for COVID-19, and a UTI.  Pt. 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 Pt. attended inpatient rehab from 08/07/2021-08/22/2021. Pt. received Home health therapy services this past spring.  Pt. has recently had an assessment through driver rehabilitation services at [facility] with recommendations for referrals for  outpatient OT/PT, and ST services. Pt. PMHx includes: HTN, Hyperlipidemia, Macular degeneration, DM, and Obesity.   PRECAUTIONS: None   WEIGHT BEARING RESTRICTIONS No   PAIN:  Are you having pain? 4/10 Everywhere, 5/10 LUE   FALLS: Has patient fallen in last 6 months? Yes.   LIVING ENVIRONMENT: Lives with: lives with their family  Son  [name] Lives in: House/apartment Main living are on one floor Stairs:  2 steps to enter Has following equipment at home: Single point cane, Wheelchair (manual), shower chair, Grab bars, bed rail, and rubber mat.   PLOF: Independent   PATIENT GOALS: To be able to Drive, and cook again   OBJECTIVE:    HAND DOMINANCE: Right    ADLs:  Transfers/ambulation related to ADLs: Independent Eating: Independent Grooming: [employer] Pt. reports being unable to use her left hand to perform haircare.  UB Dressing: Independent pullover shirt, independent now with fastening a bra LB Dressing: Independent donning pants socks, and slide on shoes Toileting: Independent Bathing: Independent Tub Shower transfers: Walk-in shower Independent now     IADLs: Shopping: Needs to be accompanied to the grocery store. Light housekeeping: Do  Meal Prep:  Is able to perform light meal prep Community mobility: Relies on son, and friend Medication management: Son sets up Building surveyor management:TBD Handwriting: TBD   MOBILITY STATUS: Hx of falls out of bed     ACTIVITY  TOLERANCE: Activity tolerance:  10-20 min. Before rest break   FUNCTIONAL OUTCOME MEASURES: FOTO: 61  TR score: 62  10/09/2022: FOTO: 49   UPPER EXTREMITY ROM      Active ROM Right eval Left eval Left  10/09/2022  Shoulder flexion WFL 54(74) scaption 0(55)  Shoulder abduction WFL 58(75) 32(54)  Shoulder adduction       Shoulder extension       Shoulder internal rotation       Shoulder external rotation       Elbow flexion [facility] WFL 145  Elbow extension [facility] WFL 10(0)  Wrist flexion WFL TBD WFL  Wrist extension WFL TBD WFL  Wrist ulnar deviation       Wrist radial deviation       Wrist pronation       Wrist supination       (Blank  rows = not tested)     UPPER EXTREMITY MMT:      MMT Right eval Left eval Left 10/09/2022  Shoulder flexion 4+/5 2+/5 1/5  Shoulder abduction 4+/5 2+/5 2-/5  Shoulder adduction       Shoulder extension       Shoulder internal rotation       Shoulder external rotation       Middle trapezius       Lower trapezius       Elbow flexion 4+/5 3+/5 3/5  Elbow extension 4+/5 3+/5 3/5  Wrist flexion 4+/5 TBD 3/5  Wrist extension 4+/5 TBD 3/5  Wrist ulnar deviation       Wrist radial deviation       Wrist pronation       Wrist supination       (Blank rows = not tested)   HAND FUNCTION: Grip strength: Right: 33 lbs; Left: 11 lbs, Lateral pinch: Right: 17 lbs, Left: 2 lbs, and 3 point pinch: Right: 17 lbs, Left: 2 lbs  10/09/2022: Grip strength: Right: 33 lbs; Left: 10 lbs, Lateral pinch: Right: 17 lbs, Left: 1 lbs, and 3 point pinch: Right: 17 lbs, Left: 1 lbs   COORDINATION:   9 Hole Peg test: Right: 27 sec.  sec; Left: 52 sec.  10/09/2022: 9 Hole Peg test: Right: 27 sec.  sec; Left: 1 min. & 35 sec.   SENSATION: Light touch: WFL Proprioception: WFL   COGNITION: Overall cognitive status: Within functional limits for tasks assessed   VISION: Subjective report: Glasses. Just received a new prescription to increase bifocal  strength Baseline vision: Macular Degeneration Visual history: macular degeneration   VISION ASSESSMENT: To be further assessed in functional context  Left sided Awareness      PERCEPTION: Limited left sided awareness     TODAY'S TREATMENT:    Therapeutic Ex:  Pt. performed gross gripping with a gross grip strengthener. Pt. worked on sustaining grip while discarding them into a container placed on a table at the left of the board. The Gripper was set to 6.6# of grip strength resistance. Pt. worked on pinch strengthening in the left hand for lateral, and 3pt. pinch using yellow, red, and  green resistive clips. Pt. worked on placing the clips at various vertical and horizontal angles. Tactile and verbal cues were required for eliciting the desired movement. Pt. requires continued OT services to  work towards improving L functioning in order to maximize engagement, and independence with these tasks.  Neuromuscular re-education:  Pt. Worked on grasping coins from a tabletop surface, placing them into a resistive container, and pushing them through the slot while isolating his 2nd digit. Pt. worked on moving the coins to the edge of a flat surface isolating her 2nd digit to the thumb to form the grasp in preparation for placing them into a resistive container.   PATIENT EDUCATION: Education details: Compensatory strategies during typing. Person educated: Patient Education method: verbal cues Education comprehension: verbalized understanding and pt does not plan to cook unless son is present.     HOME EXERCISE PROGRAM:  Reviewed home activities to enhance left hand digit extension  during typing skills.       GOALS: GOALS: Goals reviewed with patient? Yes     SHORT TERM GOALS: Target date: 10/28/2022     Pt. Will improve FOTO score by 2 points to reflect improved pt. perceived functional performance Baseline:  10/09/2022: FOTO 49, FOTO: 61 TR score: 62  Goal status:  INITIAL    LONG TERM GOALS: Target date: 12/09/2022    Pt. Will improve left shoulder ROM by 10 degrees to be able able to efficiently apply deodorant Baseline: 10/09/2022: flexion: 0(55), Abduction: 32(54) Goal status:Revised from strength to ROM   2.  Pt. will be able to independently hold and use a blow dryer, and brush for hair care. Baseline: 10/09/2022: Pt. Is unable to hold a blow dryer. Eval: Pt. Is unable to sustain her BUEs in elevation, and use a blow dryer, and brush. Goal status: Ongoing   3.  Pt. Will improve left lateral pinch strength by 3# to be able to independently cut meat. Baseline: 10/09/2022: 1#  Eval: 2#. Pt. has difficulty stabilizing utensil, and food while cutting food. Goal status: Ongoing   4.  Pt. Will improve Left hand [facility] skills to be able to be able to independently, and efficiently manipulate small objects during ADL tasks. Baseline 10/09/2022: Left: 1 min & 35 sec.: Eval: Left: 52 sec. Right: 27 sec. Goal status: Ongoing     6.: Pt. will improve with left grip strength to be able to hold and pull up covers.             Baseline: 10/09/2022: Left: 10# Eval:  Left grip strength 11#. Pt. has difficulty holding a full drink  securely with the left hand.             Goal status: INITIAL   7. Pt. Will improve typing speed, and accuracy in preparation for efficiently typing simple email message. Baseline: 10/09/2022: Pt. presents with difficulty typing. Pt. presents with difficulty typing an email. Typing speed, and accuracy TBD             Goal status: INITIAL                                        CLINICAL IMPRESSION:  Pt.  reports 4/10 pain all over, and 5/10 LUE pain today.  Pt. continues to present with decreased LUE ROM, strength, and  limited LUE functioning. Pt. Required set-up, and hand-over-hand assist to movement patterns. Pt. Reports that she tries to move, and position her left shoulder into abduction at home to allow air to her axilla. Pt. Required the  position of tasks to be modified to accommodate the limited LUE reach.  Pt. requires continued OT services to  work towards improving L functioning in order to maximize engagement, and independence with these tasks. Goals have been modified.     Marland Kitchen    PERFORMANCE DEFICITS in functional skills including ADLs, IADLs, coordination, dexterity, sensation, ROM, strength, FMC, decreased knowledge of use of DME, vision, and UE functional use, cognitive skills including attention, and psychosocial skills including coping strategies, environmental adaptation, habits, and routines and behaviors.    IMPAIRMENTS are limiting patient from ADLs, IADLs, and social participation.    COMORBIDITIES may have co-morbidities  that affects occupational performance. Patient will benefit from skilled OT to address above impairments and improve overall function.   MODIFICATION OR ASSISTANCE TO COMPLETE EVALUATION: Min-Moderate modification of tasks or assist with assess necessary to complete an evaluation.   OT OCCUPATIONAL PROFILE AND HISTORY: Detailed assessment: Review of records and additional review of physical, cognitive, psychosocial history related to current functional performance.   CLINICAL DECISION MAKING: Moderate - several treatment options, min-mod task modification necessary   REHAB POTENTIAL: Good   EVALUATION  COMPLEXITY: Moderate      PLAN: OT FREQUENCY: 2x/week   OT DURATION: 12 weeks   PLANNED INTERVENTIONS: self care/ADL training, therapeutic exercise, therapeutic activity, neuromuscular re-education, manual therapy, passive range of motion, and paraffin   RECOMMENDED OTHER SERVICES: ST, and PT   CONSULTED AND AGREED WITH PLAN OF CARE: Patient   PLAN FOR NEXT SESSION:  L hand strengthening and coordination exercises   Harrel Carina, MS, OTR/L
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
## 2022-10-16 ENCOUNTER — Ambulatory Visit: Payer: Medicare HMO

## 2022-10-16 ENCOUNTER — Ambulatory Visit: Payer: Medicare HMO | Admitting: Occupational Therapy

## 2022-10-16 ENCOUNTER — Encounter: Payer: Medicare HMO | Admitting: Speech Pathology

## 2022-10-16 DIAGNOSIS — M25552 Pain in left hip: Secondary | ICD-10-CM

## 2022-10-16 DIAGNOSIS — M6281 Muscle weakness (generalized): Secondary | ICD-10-CM

## 2022-10-16 DIAGNOSIS — M542 Cervicalgia: Secondary | ICD-10-CM | POA: Diagnosis not present

## 2022-10-16 NOTE — Therapy (Signed)
OUTPATIENT PHYSICAL THERAPY  TREATMENT   Patient Name: Alyssa Mayo MRN: [id number] DOB:July 26, 1945, 77 y.o., female Today's Date: 10/16/2022  END OF SESSION:   PT End of Session - 10/16/22 1100     Visit Number 8    Number of Visits 12    Date for PT Re-Evaluation 12/09/22    Authorization Type [employer]    Progress Note Due on Visit 10    PT Start Time 1016    PT Stop Time 1056    PT Time Calculation (min) 40 min    Activity Tolerance Patient tolerated treatment well;No increased pain    Behavior During Therapy [facility] for tasks assessed/performed                Past Medical History:  Diagnosis Date   Abnormal levels of other serum enzymes 07/17/2015   Arthritis    Constipation 07/11/2015   COVID-19 03/06/2021   Diabetes mellitus without complication ([place])    Elevated liver enzymes    Fatty liver    Generalized abdominal pain 07/11/2015   Hyperlipidemia    Hypertension    Lifelong obesity    Macular degeneration    Morbid obesity due to excess calories ([place]) 07/11/2015   Other fatigue 07/11/2015   Sleep apnea    Spinal headache    Past Surgical History:  Procedure Laterality Date   ABDOMINAL HYSTERECTOMY     APPENDECTOMY     CATARACT EXTRACTION     CERVICAL LAMINECTOMY  07/21/1985   CESAREAN SECTION     COLONOSCOPY  07/21/2012   diverticulosis, [facility] Dr. Donnella Sham    COLONOSCOPY WITH PROPOFOL N/A 02/04/2019   Procedure: COLONOSCOPY WITH PROPOFOL;  Surgeon: Lollie Sails, MD;  Location: [facility] ENDOSCOPY;  Service: Endoscopy;  Laterality: N/A;   COLONOSCOPY WITH PROPOFOL N/A 03/18/2021   Procedure: COLONOSCOPY WITH PROPOFOL;  Surgeon: Lesly Rubenstein, MD;  Location: [facility] ENDOSCOPY;  Service: Endoscopy;  Laterality: N/A;  DM   DIAGNOSTIC LAPAROSCOPY     ESOPHAGOGASTRODUODENOSCOPY (EGD) WITH PROPOFOL N/A 02/04/2019   Procedure: ESOPHAGOGASTRODUODENOSCOPY (EGD) WITH PROPOFOL;  Surgeon: Lollie Sails, MD;  Location: [facility] ENDOSCOPY;  Service:  Endoscopy;  Laterality: N/A;   ESOPHAGOGASTRODUODENOSCOPY (EGD) WITH PROPOFOL N/A 03/18/2021   Procedure: ESOPHAGOGASTRODUODENOSCOPY (EGD) WITH PROPOFOL;  Surgeon: Lesly Rubenstein, MD;  Location: [facility] ENDOSCOPY;  Service: Endoscopy;  Laterality: N/A;   LOOP RECORDER INSERTION N/A 08/05/2021   Procedure: LOOP RECORDER INSERTION;  Surgeon: Evans Lance, MD;  Location: [facility] CV LAB;  Service: Cardiovascular;  Laterality: N/A;   [place]     TUBAL LIGATION     Patient Active Problem List   Diagnosis Date Noted   UTI (urinary tract infection) 09/01/2022   COVID-19 virus infection 09/01/2022   Chronic diastolic CHF (congestive heart failure) ([place]) 09/01/2022   Chronic kidney disease, stage 3a ([place]) 09/01/2022   Obesity (BMI 30-39.9) 09/01/2022   Paroxysmal atrial fibrillation ([place]) 08/08/2022   Hypercoagulable state due to paroxysmal atrial fibrillation ([place]) 08/08/2022   Cerebral edema ([place]) 08/07/2021   OSA (obstructive sleep apnea) 08/07/2021   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   CVA (cerebral vascular accident) ([place]) 08/01/2021   GERD (gastroesophageal reflux disease) 07/20/2019   Advanced care planning/counseling discussion 12/17/2016   Skin lesions, generalized 11/13/2016   Chronic right hip pain 06/17/2016   Vitamin D deficiency 09/26/2015   Diastasis recti 08/08/2015   Elevated serum GGT level 07/24/2015   Essential hypertension 07/17/2015   Fatty liver 07/17/2015  Elevated alkaline phosphatase level 07/17/2015   Elevated serum glutamic pyruvic transaminase (SGPT) level 07/17/2015   Abnormal finding on EKG 07/11/2015   Morbid obesity ([place]) 07/11/2015   Colon, diverticulosis 07/11/2015   Abdominal wall hernia 07/11/2015   DM (diabetes mellitus), type 2 with neurological complications ([place])    Hyperlipidemia     PCP: Maryland Pink, MD  REFERRING PROVIDER: Maryland Pink, MD & Scarlett Presto, [place]  REFERRING DIAG: [phone] (ICD-10-CM)  - Cerebral infarction due to unspecified occlusion or stenosis of unspecified precerebral arteries, Cervicalgia with degenerative disc  THERAPY DIAG:  Pain in left hip  Muscle weakness (generalized)  Rationale for Evaluation and Treatment: Rehabilitation  ONSET DATE: 09/03/22  SUBJECTIVE:                                                                                                                                                                                                         SUBJECTIVE STATEMENT:  Pt reports she is feeling better today. Reports her L wrist is feeling alright. She rates her L hip pain as a 2/10. She reports no other stumbles/falls. Pt reports she is using heat feature on her new lift chair for her hip pain.   PERTINENT HISTORY:   Per chart and confirmed by pt: Pt is a 77 yo female with RMCA CVA infarction with hemorrhagic transformation 08/01/2021, with inpatient rehab 08/07/2021-08/22/2021 followed by [facility] therapy until June. Pt now currently being seen for outpatient OT and ST services. Pt reports limitations in stamina persist. Other PMH is significant for HTN, HLD, macular degeneration, DM, obesity, sleep apnea, arthritis, chronic R hip pain, vitamin D deficiency, diastasis recti, abdominal wall hernia, hx of abdominal hysterectomy, appendectomy, spine surgery (years ago).   PAIN:  Are you having pain? Yes: NPRS scale: 0/10 Pain location: L shoulder/neck area posteriorly Pain description: dull ache Aggravating factors: sitting Relieving factors: laying down  PRECAUTIONS: None  WEIGHT BEARING RESTRICTIONS: No  FALLS:  Has patient fallen in last 6 months? Yes. Number of falls 2  LIVING ENVIRONMENT: Lives with: lives with their son Lives in: House/apartment Stairs: Yes: Internal: 13 steps; doesn't go up to the second floor and External: 4 steps; can reach both Has following equipment at home: Single point cane and Walker - 2 wheeled  OCCUPATION:  Retired  PLOF: Independent with basic ADLs  PATIENT GOALS: "To make my neck quit hurting and to prevent it from getting worse"  NEXT MD VISIT: Marena Chancy, but maybe May  OBJECTIVE:   DIAGNOSTIC FINDINGS:    CT HEAD WITHOUT CONTRAST  CT CERVICAL SPINE WITHOUT CONTRAST IMPRESSION: 1. No acute intracranial pathology. 2. Expected interval evolution of encephalomalacia of the right frontal lobe, following intraparenchymal hemorrhage seen acutely on examination dated 08/03/2021. 3. No fracture or static subluxation of the cervical spine. 4. Generally mild disc space height loss and osteophytosis throughout the cervical spine, with partial ankylosis of C5-C6 and a large left eccentric posterior bridging osteophyte at this level. MRI may be used to further evaluate cervical disc and neural foraminal pathology if indicated by neurologically localizing signs and symptoms.  EXAM: xRay PELVIS - 1-2 VIEW  IMPRESSION: 1. No convincing evidence of acute fracture or diastasis of the pelvis. 2. Well corticated osseous focus beneath the right inferior pubic ramus may reflect sequela of avulsion injury or chronic tendinopathy, suggest correlation with direct tenderness to palpation.   PATIENT SURVEYS:  NDI 15/50 = 30% disability FOTO 65/68  COGNITION: Overall cognitive status: History of cognitive impairments - at baseline  SENSATION: WFL  POSTURE: rounded shoulders and forward head  PALPATION:     CERVICAL ROM:   Active ROM A/PROM (deg) eval  Flexion 39  Extension 10  Right lateral flexion 7  Left lateral flexion 6  Right rotation 49  Left rotation 30   (Blank rows = not tested)  UPPER EXTREMITY ROM:  Active ROM Right eval Left eval  Shoulder flexion 128 81  Shoulder abduction 160 68   (Blank rows = not tested)   UPPER EXTREMITY MMT:  Grossly decreased (3+/5) in the L UE due to pain and lack of ROM  (Blank rows = not tested)  CERVICAL SPECIAL TESTS:   Spurling's test: Negative and Sharp pursor's test: Negative  Active ROM A/PROM (deg) eval 10/09/22  Flexion 39 25  Extension 10 24  Right lateral flexion 7 15  Left lateral flexion 6 20  Right rotation 49 53  Left rotation 30 42   (Blank rows = not tested)    LOWER EXTREMITY MMT:    MMT Right 3/21 Left 3/21  Hip flexion 4+ 4-  Hip extension 4+ 4-  Hip abduction 4+ 4  Hip adduction 4+ 4  Hip internal rotation    Hip external rotation    Knee flexion 4+ 4-  Knee extension 4+ 4  Ankle dorsiflexion 4+ 4  Ankle plantarflexion    Ankle inversion    Ankle eversion     (Blank rows = not tested)  LOWER EXTREMITY ROM:     Passive  Right 3/21 Left 3/21  Hip flexion 120 105  Hip extension    Hip abduction    Hip adduction    Hip internal rotation    Hip external rotation    Knee flexion [facility] WFL  Knee extension (in Hip flexion 90deg) 143 120  Ankle dorsiflexion    Ankle plantarflexion    Ankle inversion    Ankle eversion     (Blank rows = not tested)    LOWER EXTREMITY SPECIAL TESTS:  Hip special tests: Saralyn Pilar (FABER) test: positive , Hip scouring test: negative, and distraction: negative    FUNCTIONAL TESTS:  Not performed at this time.  TODAY'S TREATMENT: DATE: 10/16/22   Hot pack in place on c-spine and upper back throughout supine therex and manual therapy. Pt reports heat feels good. Skin checked and is WNL upon removal of heat. No adverse reaction to treatment.  On large mat table: Manual: HS stretch 2 x 1 min each LE .  Piriformis stretch 2 x 1 min each LE  Therex:  SAQ x 15 BLE Partial bridging with BLE on bolster, pushing into bolster to lift hips off table through pain-free ROM - 2x10. Pt reports she is able to achieve comfortable ROM with intervention, performs reps pain-free, states, "I'm not in any pain." LTR x 15 each side.  Supine clam shell 1x15, 1x8 RTB LTR x 5 each side  Hip abduction AROM x 12 Hip adduction squeeze with p.ball in  hooklye 1x10 with 3 sec hold/rep, 1x6 with 3 sec hold/rep     PATIENT EDUCATION:  Education details: Pt educated on role of PT and services provided during current POC, along with prognosis and information about the clinic. Need for Modified PT referral to address new L hip and low back pain. Person educated: Patient and   Education method: Explanation Education comprehension: verbalized understanding  HOME EXERCISE PROGRAM:  Access Code: [email or web address] URL: [email or web address] Date: 09/18/2022 Prepared by: [place]  Exercises - Seated Upper Trapezius Stretch  - 1 x daily - 7 x weekly - 3 sets - 10 reps - Seated Levator Scapulae Stretch  - 1 x daily - 7 x weekly - 3 sets - 10 reps - Seated Scapular Retraction  - 1 x daily - 7 x weekly - 3 sets - 10 reps  ASSESSMENT:  CLINICAL IMPRESSION: Pt tolerates all interventions well without increased pain, demonstrating improvement from previous session. Pt even reports pain decrease to 0.5/10 by end of session. She is able to progress number of reps completed, rating interventions as easy. Pt would benefit from skilled PT to continue to improve cervical ROm as well as address impairments of the LLE for main management and improved strength and function to improve QoL and reduce dependence on husband as caregiver.        OBJECTIVE IMPAIRMENTS: decreased activity tolerance, decreased cognition, decreased ROM, decreased strength, decreased safety awareness, hypomobility, and pain.   ACTIVITY LIMITATIONS: carrying, lifting, bending, sitting, standing, squatting, and reach over head  PARTICIPATION LIMITATIONS: meal prep, cleaning, laundry, driving, shopping, community activity, and yard work  PERSONAL FACTORS: Age, Behavior pattern, Past/current experiences, Time since onset of injury/illness/exacerbation, and 3+ comorbidities: arthritis, DM2, HTN, Obesity,   are also affecting patient's functional outcome.   REHAB POTENTIAL:  Good  CLINICAL DECISION MAKING: Stable/uncomplicated  EVALUATION COMPLEXITY: Low   GOALS: Goals reviewed with patient? Yes  SHORT TERM GOALS: Target date: 10/14/2022  Pt will be independent with HEP in order to improve strength of UE's and ROM of the neck in order to return to PLOF. Baseline: Not given at initial evaluation. Goal status: INITIAL    LONG TERM GOALS: Target date: 12/09/2022  Patient will demonstrate improved function as evidenced by a score of 68 on FOTO measure for full participation in activities at home and in the community. Baseline: 65 Goal status: INITIAL  2.  Pt to improve NDI by 5 points or 10% in order to demonstrate a reduction in overall disability caused by neck pain at this time. Baseline: 15/50 or 30% disability Goal status: INITIAL  3.  Pt to demonstrate improved cervical rotation to 60 deg bilaterally in order to perform tasks around the house that require turning of the head. Baseline: R/L Cervical Rotation: 49/30 Goal status: INITIAL  4.  Pt to report 0/10 pain in the neck following light household chores at home in order to demonstrate an overall reduction in pain and an improvement in QoL. Baseline: 4/10 at rest Goal status: INITIAL   PLAN:  PT FREQUENCY: 1-2x/week  PT DURATION: 12 weeks  PLANNED INTERVENTIONS: Therapeutic exercises, Therapeutic activity, Neuromuscular re-education, Balance training, Gait training, Patient/Family education, Self Care, Joint mobilization, Joint manipulation, Vestibular training, Canalith repositioning, Visual/preceptual remediation/compensation, DME instructions, Dry Needling, Cognitive remediation, Spinal manipulation, Spinal mobilization, Cryotherapy, Moist heat, Traction, Manual therapy, and Re-evaluation    PLAN FOR NEXT SESSION:    Continue to address cervical ROM and strength deficits. Continue to address L hip and low back pain.   Ricard Dillon PT, DPT  Physical Therapist- [facility]   11:01 AM 10/16/22

## 2022-10-16 NOTE — Therapy (Signed)
OUTPATIENT OCCUPATIONAL THERAPY TREATMENT NOTE   Patient Name: Alyssa Mayo MRN: [id number] DOB:[birth year], 77 y.o., female Today's Date: 03/14/2022   PCP: [place] Pink, MD REFERRING PROVIDER: Frann Rider, NP     OT End of Session - 10/09/22 1208     Visit Number 6    Number of Visits 24    Date for OT Re-Evaluation 12/09/22    Authorization Type Progress reporting period starting 09/16/2022    OT Start Time 1105    OT Stop Time 1145    OT Time Calculation (min) 40 min    Activity Tolerance Patient tolerated treatment well    Behavior During Therapy [facility] for tasks assessed/performed                                    Past Medical History:  Diagnosis Date   Abnormal levels of other serum enzymes 07/17/2015   Arthritis     Constipation 07/11/2015   COVID-19 03/06/2021   Diabetes mellitus without complication ([place])     Elevated liver enzymes     Fatty liver     Generalized abdominal pain 07/11/2015   Hyperlipidemia     Hypertension     Lifelong obesity     Macular degeneration     Morbid obesity due to excess calories ([place]) 07/11/2015   Other fatigue 07/11/2015   Sleep apnea     Spinal headache           Past Surgical History:  Procedure Laterality Date   ABDOMINAL HYSTERECTOMY       APPENDECTOMY       CATARACT EXTRACTION       CERVICAL LAMINECTOMY   07/21/1985   CESAREAN SECTION       COLONOSCOPY   07/21/2012    diverticulosis, [facility] Dr. Donnella Sham    COLONOSCOPY WITH PROPOFOL N/A 02/04/2019    Procedure: COLONOSCOPY WITH PROPOFOL;  Surgeon: Lollie Sails, MD;  Location: [facility] ENDOSCOPY;  Service: Endoscopy;  Laterality: N/A;   COLONOSCOPY WITH PROPOFOL N/A 03/18/2021    Procedure: COLONOSCOPY WITH PROPOFOL;  Surgeon: Lesly Rubenstein, MD;  Location: [facility] ENDOSCOPY;  Service: Endoscopy;  Laterality: N/A;  DM   DIAGNOSTIC LAPAROSCOPY       ESOPHAGOGASTRODUODENOSCOPY (EGD) WITH PROPOFOL N/A 02/04/2019    Procedure: ESOPHAGOGASTRODUODENOSCOPY  (EGD) WITH PROPOFOL;  Surgeon: Lollie Sails, MD;  Location: [facility] ENDOSCOPY;  Service: Endoscopy;  Laterality: N/A;   ESOPHAGOGASTRODUODENOSCOPY (EGD) WITH PROPOFOL N/A 03/18/2021    Procedure: ESOPHAGOGASTRODUODENOSCOPY (EGD) WITH PROPOFOL;  Surgeon: Lesly Rubenstein, MD;  Location: [facility] ENDOSCOPY;  Service: Endoscopy;  Laterality: N/A;   LOOP RECORDER INSERTION N/A 08/05/2021    Procedure: LOOP RECORDER INSERTION;  Surgeon: Evans Lance, MD;  Location: [place] CV LAB;  Service: Cardiovascular;  Laterality: N/A;   Echo       TUBAL LIGATION            Patient Active Problem List    Diagnosis Date Noted   Paroxysmal atrial fibrillation ([place]) 08/08/2022   Hypercoagulable state due to paroxysmal atrial fibrillation ([place]) 08/08/2022   Cerebral edema ([place]) 08/07/2021   OSA (obstructive sleep apnea) 08/07/2021   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/07/2021   CVA (cerebral vascular accident) ([place]) 08/01/2021   GERD (gastroesophageal reflux disease) 07/20/2019   Advanced care planning/counseling discussion 12/17/2016   Skin lesions, generalized 11/13/2016   Chronic right hip pain 06/17/2016  Vitamin D deficiency 09/26/2015   Diastasis recti 08/08/2015   Elevated serum GGT level 07/24/2015   Essential hypertension 07/17/2015   Fatty liver 07/17/2015   Elevated alkaline phosphatase level 07/17/2015   Elevated serum glutamic pyruvic transaminase (SGPT) level 07/17/2015   Abnormal finding on EKG 07/11/2015   Morbid obesity ([place]) 07/11/2015   Colon, diverticulosis 07/11/2015   Abdominal wall hernia 07/11/2015   DM (diabetes mellitus), type 2 with neurological complications ([place])     Hyperlipidemia        ONSET DATE: 08/01/2021   REFERRING DIAG: CVA   THERAPY DIAG:  Muscle weakness (generalized)   Rationale for Evaluation and Treatment Rehabilitation   SUBJECTIVE:    SUBJECTIVE STATEMENT:    Pt. Reports having had labs drawn this morning.   Pt  accompanied by: self   PERTINENT HISTORY:  Pt. is a 77 y.o. female who had a unexpectedly hospitalized from 2/12-2/14/2024 for COVID-19, and a UTI.  Pt. 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 Pt. attended inpatient rehab from 08/07/2021-08/22/2021. Pt. received Home health therapy services this past spring.  Pt. has recently had an assessment through driver rehabilitation services at [facility] with recommendations for referrals for  outpatient OT/PT, and ST services. Pt. PMHx includes: HTN, Hyperlipidemia, Macular degeneration, DM, and Obesity.   PRECAUTIONS: None   WEIGHT BEARING RESTRICTIONS No   PAIN:  Are you having pain? No   FALLS: Has patient fallen in last 6 months? Yes.   LIVING ENVIRONMENT: Lives with: lives with their family  Son  Alyssa Mayo Lives in: House/apartment Main living are on one floor Stairs:  2 steps to enter Has following equipment at home: Single point cane, Wheelchair (manual), shower chair, Grab bars, bed rail, and rubber mat.   PLOF: Independent   PATIENT GOALS: To be able to Drive, and cook again   OBJECTIVE:    HAND DOMINANCE: Right    ADLs:  Transfers/ambulation related to ADLs: Independent Eating: Independent Grooming: [employer] Pt. reports being unable to use her left hand to perform haircare.  UB Dressing: Independent pullover shirt, independent now with fastening a bra LB Dressing: Independent donning pants socks, and slide on shoes Toileting: Independent Bathing: Independent Tub Shower transfers: Walk-in shower Independent now     IADLs: Shopping: Needs to be accompanied to the grocery store. Light housekeeping: Do  Meal Prep:  Is able to perform light meal prep Community mobility: Relies on son, and friend Medication management: Son sets up Building surveyor management:TBD Handwriting: TBD   MOBILITY STATUS: Hx of falls out of bed     ACTIVITY TOLERANCE: Activity tolerance:  10-20  min. Before rest break   FUNCTIONAL OUTCOME MEASURES: FOTO: 61  TR score: 62  10/09/2022: FOTO: 49   UPPER EXTREMITY ROM      Active ROM Right eval Left eval Left  10/09/2022  Shoulder flexion WFL 54(74) scaption 0(55)  Shoulder abduction WFL 58(75) 32(54)  Shoulder adduction       Shoulder extension       Shoulder internal rotation       Shoulder external rotation       Elbow flexion [facility] WFL 145  Elbow extension [facility] WFL 10(0)  Wrist flexion WFL TBD WFL  Wrist extension WFL TBD WFL  Wrist ulnar deviation       Wrist radial deviation       Wrist pronation       Wrist supination       (Blank rows =  not tested)     UPPER EXTREMITY MMT:      MMT Right eval Left eval Left 10/09/2022  Shoulder flexion 4+/5 2+/5 1/5  Shoulder abduction 4+/5 2+/5 2-/5  Shoulder adduction       Shoulder extension       Shoulder internal rotation       Shoulder external rotation       Middle trapezius       Lower trapezius       Elbow flexion 4+/5 3+/5 3/5  Elbow extension 4+/5 3+/5 3/5  Wrist flexion 4+/5 TBD 3/5  Wrist extension 4+/5 TBD 3/5  Wrist ulnar deviation       Wrist radial deviation       Wrist pronation       Wrist supination       (Blank rows = not tested)   HAND FUNCTION: Grip strength: Right: 33 lbs; Left: 11 lbs, Lateral pinch: Right: 17 lbs, Left: 2 lbs, and 3 point pinch: Right: 17 lbs, Left: 2 lbs  10/09/2022: Grip strength: Right: 33 lbs; Left: 10 lbs, Lateral pinch: Right: 17 lbs, Left: 1 lbs, and 3 point pinch: Right: 17 lbs, Left: 1 lbs   COORDINATION:   9 Hole Peg test: Right: 27 sec.  sec; Left: 52 sec.  10/09/2022: 9 Hole Peg test: Right: 27 sec.  sec; Left: 1 min. & 35 sec.   SENSATION: Light touch: WFL Proprioception: WFL   COGNITION: Overall cognitive status: Within functional limits for tasks assessed   VISION: Subjective report: Glasses. Just received a new prescription to increase bifocal strength Baseline vision: Macular Degeneration Visual  history: macular degeneration   VISION ASSESSMENT: To be further assessed in functional context  Left sided Awareness      PERCEPTION: Limited left sided awareness     TODAY'S TREATMENT:    Therapeutic Ex:  Pt. Performed AAROM for shoulder flexion at the tabletop. Pt. Worked on BUE strengthening, and reciprocal motion using the UBE while seated for 8 min. with no resistance. Constant monitoring was provided.  Pt. Performed 1# dumbbell ex. for elbow flexion and extension, forearm supination/pronation, wrist flexion/extension, and radial deviation for 1 set 10 reps each. Pt. requires rest breaks and verbal cues for proper technique. Pt. performed gross gripping with a gross grip strengthener. Pt. worked on sustaining grip while discarding them into a container placed on a table at the left of the board. The Gripper was set to 6.6# of grip strength resistance. Pt. worked on pinch strengthening in the left hand for lateral, and 3pt. pinch using yellow, red, and  green resistive clips. Pt. Worked on reaching out to discard the clips into container. Pt. worked on left hand digit flexion using the DigiFlex 1.5#. Pt. worked on placing the clips at various vertical and horizontal angles. Tactile and verbal cues were required for eliciting the desired movement. Pt. requires continued OT services to  work towards improving L functioning in order to maximize engagement, and independence with these tasks.  PATIENT EDUCATION: Education details: Compensatory strategies during typing. Person educated: Patient Education method: verbal cues Education comprehension: verbalized understanding and pt does not plan to cook unless son is present.     HOME EXERCISE PROGRAM:  Reviewed home activities to enhance left hand digit extension  during typing skills.       GOALS: GOALS: Goals reviewed with patient? Yes     SHORT TERM GOALS: Target date: 10/28/2022     Pt. Will improve FOTO score by  2 points to  reflect improved pt. perceived functional performance Baseline:  10/09/2022: FOTO 49, FOTO: 61 TR score: 62  Goal status: INITIAL   LONG TERM GOALS: Target date: 12/09/2022    Pt. Will improve left shoulder ROM by 10 degrees to be able able to efficiently apply deodorant Baseline: 10/09/2022: flexion: 0(55), Abduction: 32(54) Goal status:Revised from strength to ROM   2.  Pt. will be able to independently hold and use a blow dryer, and brush for hair care. Baseline: 10/09/2022: Pt. Is unable to hold a blow dryer. Eval: Pt. Is unable to sustain her BUEs in elevation, and use a blow dryer, and brush. Goal status: Ongoing   3.  Pt. Will improve left lateral pinch strength by 3# to be able to independently cut meat. Baseline: 10/09/2022: 1#  Eval: 2#. Pt. has difficulty stabilizing utensil, and food while cutting food. Goal status: Ongoing   4.  Pt. Will improve Left hand [facility] skills to be able to be able to independently, and efficiently manipulate small objects during ADL tasks. Baseline 10/09/2022: Left: 1 min & 35 sec.: Eval: Left: 52 sec. Right: 27 sec. Goal status: Ongoing     6.: Pt. will improve with left grip strength to be able to hold and pull up covers.             Baseline: 10/09/2022: Left: 10# Eval:  Left grip strength 11#. Pt. has difficulty holding a full drink  securely with the left hand.             Goal status: INITIAL   7. Pt. Will improve typing speed, and accuracy in preparation for efficiently typing simple email message. Baseline: 10/09/2022: Pt. presents with difficulty typing. Pt. presents with difficulty typing an email. Typing speed, and accuracy TBD             Goal status: INITIAL                                        CLINICAL IMPRESSION:  Pt.  reports 0/10 LUE pain today. Pt. Reports performing AAROM for shoulder flexion on a countertop at home when applying deodorant  to the left axilla this morning. Pt. continues to present with decreased LUE ROM, strength,  and  limited LUE functioning. Pt. Required set-up, however required less hand-over-hand assist to movement patterns. Pt. reports that she tries to move, and position her left shoulder into abduction at home to allow air to her axilla. Pt. Required the position of the pegs to be modified, and adjusted to the front row of the pegboard. Pt. dropped 2 pegs from the left grip. to be modified to accommodate the limited LUE reach.  Pt. requires continued OT services to  work towards improving L functioning in order to maximize engagement, and independence with these tasks. Goals have been modified.     Marland Kitchen    PERFORMANCE DEFICITS in functional skills including ADLs, IADLs, coordination, dexterity, sensation, ROM, strength, FMC, decreased knowledge of use of DME, vision, and UE functional use, cognitive skills including attention, and psychosocial skills including coping strategies, environmental adaptation, habits, and routines and behaviors.    IMPAIRMENTS are limiting patient from ADLs, IADLs, and social participation.    COMORBIDITIES may have co-morbidities  that affects occupational performance. Patient will benefit from skilled OT to address above impairments and improve overall function.   MODIFICATION OR ASSISTANCE TO COMPLETE  EVALUATION: Min-Moderate modification of tasks or assist with assess necessary to complete an evaluation.   OT OCCUPATIONAL PROFILE AND HISTORY: Detailed assessment: Review of records and additional review of physical, cognitive, psychosocial history related to current functional performance.   CLINICAL DECISION MAKING: Moderate - several treatment options, min-mod task modification necessary   REHAB POTENTIAL: Good   EVALUATION COMPLEXITY: Moderate      PLAN: OT FREQUENCY: 2x/week   OT DURATION: 12 weeks   PLANNED INTERVENTIONS: self care/ADL training, therapeutic exercise, therapeutic activity, neuromuscular re-education, manual therapy, passive range of motion,  and paraffin   RECOMMENDED OTHER SERVICES: ST, and PT   CONSULTED AND AGREED WITH PLAN OF CARE: Patient   PLAN FOR NEXT SESSION:  L hand strengthening and coordination exercises   Harrel Carina, MS, OTR/L

## 2022-10-20 ENCOUNTER — Ambulatory Visit: Payer: Medicare HMO | Attending: Internal Medicine

## 2022-10-20 DIAGNOSIS — I639 Cerebral infarction, unspecified: Secondary | ICD-10-CM

## 2022-10-20 LAB — CUP PACEART REMOTE DEVICE CHECK
Date Time Interrogation Session: 20240331231756
Implantable Pulse Generator Implant Date: 20230116

## 2022-10-21 ENCOUNTER — Ambulatory Visit: Payer: Medicare HMO | Attending: Family Medicine | Admitting: Occupational Therapy

## 2022-10-21 ENCOUNTER — Ambulatory Visit: Payer: Medicare HMO | Admitting: Physical Therapy

## 2022-10-21 DIAGNOSIS — R278 Other lack of coordination: Secondary | ICD-10-CM | POA: Diagnosis present

## 2022-10-21 DIAGNOSIS — M542 Cervicalgia: Secondary | ICD-10-CM | POA: Diagnosis present

## 2022-10-21 DIAGNOSIS — I63511 Cerebral infarction due to unspecified occlusion or stenosis of right middle cerebral artery: Secondary | ICD-10-CM | POA: Diagnosis present

## 2022-10-21 DIAGNOSIS — M25552 Pain in left hip: Secondary | ICD-10-CM | POA: Diagnosis present

## 2022-10-21 DIAGNOSIS — R482 Apraxia: Secondary | ICD-10-CM | POA: Diagnosis present

## 2022-10-21 DIAGNOSIS — R262 Difficulty in walking, not elsewhere classified: Secondary | ICD-10-CM | POA: Diagnosis present

## 2022-10-21 DIAGNOSIS — R2681 Unsteadiness on feet: Secondary | ICD-10-CM | POA: Diagnosis present

## 2022-10-21 DIAGNOSIS — M6281 Muscle weakness (generalized): Secondary | ICD-10-CM

## 2022-10-21 NOTE — Therapy (Signed)
OUTPATIENT PHYSICAL THERAPY  TREATMENT   Patient Name: Alyssa Mayo MRN: [id number] DOB:17-Jul-1946, 77 y.o., female Today's Date: 10/21/2022  END OF SESSION:   PT End of Session - 10/21/22 1040     Visit Number 9    Number of Visits 12    Date for PT Re-Evaluation 12/09/22    Authorization Type [employer]    Progress Note Due on Visit 10    PT Start Time 1103    PT Stop Time 1145    PT Time Calculation (min) 42 min    Activity Tolerance Patient tolerated treatment well;No increased pain    Behavior During Therapy [facility] for tasks assessed/performed                Past Medical History:  Diagnosis Date   Abnormal levels of other serum enzymes 07/17/2015   Arthritis    Constipation 07/11/2015   COVID-19 03/06/2021   Diabetes mellitus without complication ([place])    Elevated liver enzymes    Fatty liver    Generalized abdominal pain 07/11/2015   Hyperlipidemia    Hypertension    Lifelong obesity    Macular degeneration    Morbid obesity due to excess calories ([place]) 07/11/2015   Other fatigue 07/11/2015   Sleep apnea    Spinal headache    Past Surgical History:  Procedure Laterality Date   ABDOMINAL HYSTERECTOMY     APPENDECTOMY     CATARACT EXTRACTION     CERVICAL LAMINECTOMY  07/21/1985   CESAREAN SECTION     COLONOSCOPY  07/21/2012   diverticulosis, [facility] Dr. Donnella Sham    COLONOSCOPY WITH PROPOFOL N/A 02/04/2019   Procedure: COLONOSCOPY WITH PROPOFOL;  Surgeon: Lollie Sails, MD;  Location: [facility] ENDOSCOPY;  Service: Endoscopy;  Laterality: N/A;   COLONOSCOPY WITH PROPOFOL N/A 03/18/2021   Procedure: COLONOSCOPY WITH PROPOFOL;  Surgeon: Lesly Rubenstein, MD;  Location: [facility] ENDOSCOPY;  Service: Endoscopy;  Laterality: N/A;  DM   DIAGNOSTIC LAPAROSCOPY     ESOPHAGOGASTRODUODENOSCOPY (EGD) WITH PROPOFOL N/A 02/04/2019   Procedure: ESOPHAGOGASTRODUODENOSCOPY (EGD) WITH PROPOFOL;  Surgeon: Lollie Sails, MD;  Location[facility] ENDOSCOPY;  Service:  Endoscopy;  Laterality: N/A;   ESOPHAGOGASTRODUODENOSCOPY (EGD) WITH PROPOFOL N/A 03/18/2021   Procedure: ESOPHAGOGASTRODUODENOSCOPY (EGD) WITH PROPOFOL;  Surgeon: Lesly Rubenstein, MD;  Location: [facility] ENDOSCOPY;  Service: Endoscopy;  Laterality: N/A;   LOOP RECORDER INSERTION N/A 08/05/2021   Procedure: LOOP RECORDER INSERTION;  Surgeon: Evans Lance, MD;  Location: [place] CV LAB;  Service: Cardiovascular;  Laterality: N/A;   [place]     TUBAL LIGATION     Patient Active Problem List   Diagnosis Date Noted   UTI (urinary tract infection) 09/01/2022   COVID-19 virus infection 09/01/2022   Chronic diastolic CHF (congestive heart failure) 09/01/2022   Chronic kidney disease, stage 3a 09/01/2022   Obesity (BMI 30-39.9) 09/01/2022   Paroxysmal atrial fibrillation 08/08/2022   Hypercoagulable state due to paroxysmal atrial fibrillation 08/08/2022   Cerebral edema 08/07/2021   OSA (obstructive sleep apnea) 08/07/2021   Right middle cerebral artery stroke 08/07/2021   CVA (cerebral vascular accident) 08/01/2021   GERD (gastroesophageal reflux disease) 07/20/2019   Advanced care planning/counseling discussion 12/17/2016   Skin lesions, generalized 11/13/2016   Chronic right hip pain 06/17/2016   Vitamin D deficiency 09/26/2015   Diastasis recti 08/08/2015   Elevated serum GGT level 07/24/2015   Essential hypertension 07/17/2015   Fatty liver 07/17/2015   Elevated alkaline phosphatase level 07/17/2015  Elevated serum glutamic pyruvic transaminase (SGPT) level 07/17/2015   Abnormal finding on EKG 07/11/2015   Morbid obesity 07/11/2015   Colon, diverticulosis 07/11/2015   Abdominal wall hernia 07/11/2015   DM (diabetes mellitus), type 2 with neurological complications    Hyperlipidemia     PCP: Maryland Pink, MD  REFERRING PROVIDER: Maryland Pink, MD & Scarlett Presto, [place]  REFERRING DIAG: [phone] (ICD-10-CM) - Cerebral infarction due to unspecified occlusion or  stenosis of unspecified precerebral arteries, Cervicalgia with degenerative disc  THERAPY DIAG:  Muscle weakness (generalized)  Pain in left hip  Cervical pain  Painful cervical ROM  Difficulty in walking, not elsewhere classified  Other lack of coordination  Rationale for Evaluation and Treatment: Rehabilitation  ONSET DATE: 09/03/22  SUBJECTIVE:                                                                                                                                                                                                         SUBJECTIVE STATEMENT:  Pt reports she is feeling better today. Reports her L wrist is feeling alright. She rates her L hip pain as a 2/10. She reports no other stumbles/falls. States that she has only mild neck pain with end of range movement    PERTINENT HISTORY:   Per chart and confirmed by pt: Pt is a 77 yo female with RMCA CVA infarction with hemorrhagic transformation 08/01/2021, with inpatient rehab 08/07/2021-08/22/2021 followed by [facility] therapy until June. Pt now currently being seen for outpatient OT and ST services. Pt reports limitations in stamina persist. Other PMH is significant for HTN, HLD, macular degeneration, DM, obesity, sleep apnea, arthritis, chronic R hip pain, vitamin D deficiency, diastasis recti, abdominal wall hernia, hx of abdominal hysterectomy, appendectomy, spine surgery (years ago).   PAIN:  Are you having pain? Yes: NPRS scale: 0/10 Pain location: L shoulder/neck area posteriorly Pain description: dull ache Aggravating factors: sitting Relieving factors: laying down  PRECAUTIONS: None  WEIGHT BEARING RESTRICTIONS: No  FALLS:  Has patient fallen in last 6 months? Yes. Number of falls 2  LIVING ENVIRONMENT: Lives with: lives with their son Lives in: House/apartment Stairs: Yes: Internal: 13 steps; doesn't go up to the second floor and External: 4 steps; can reach both Has following equipment at home: Single  point cane and Walker - 2 wheeled  OCCUPATION: Retired  PLOF: Independent with basic ADLs  PATIENT GOALS: "To make my neck quit hurting and to prevent it from getting worse"  NEXT MD VISIT: Marena Chancy, but maybe May  OBJECTIVE:   DIAGNOSTIC FINDINGS:  CT HEAD WITHOUT CONTRAST  CT CERVICAL SPINE WITHOUT CONTRAST IMPRESSION: 1. No acute intracranial pathology. 2. Expected interval evolution of encephalomalacia of the right frontal lobe, following intraparenchymal hemorrhage seen acutely on examination dated 08/03/2021. 3. No fracture or static subluxation of the cervical spine. 4. Generally mild disc space height loss and osteophytosis throughout the cervical spine, with partial ankylosis of C5-C6 and a large left eccentric posterior bridging osteophyte at this level. MRI may be used to further evaluate cervical disc and neural foraminal pathology if indicated by neurologically localizing signs and symptoms.  EXAM: xRay PELVIS - 1-2 VIEW  IMPRESSION: 1. No convincing evidence of acute fracture or diastasis of the pelvis. 2. Well corticated osseous focus beneath the right inferior pubic ramus may reflect sequela of avulsion injury or chronic tendinopathy, suggest correlation with direct tenderness to palpation.   PATIENT SURVEYS:  NDI 15/50 = 30% disability FOTO 65/68  COGNITION: Overall cognitive status: History of cognitive impairments - at baseline  SENSATION: WFL  POSTURE: rounded shoulders and forward head  PALPATION:     CERVICAL ROM:   Active ROM A/PROM (deg) eval  Flexion 39  Extension 10  Right lateral flexion 7  Left lateral flexion 6  Right rotation 49  Left rotation 30   (Blank rows = not tested)  UPPER EXTREMITY ROM:  Active ROM Right eval Left eval  Shoulder flexion 128 81  Shoulder abduction 160 68   (Blank rows = not tested)   UPPER EXTREMITY MMT:  Grossly decreased (3+/5) in the L UE due to pain and lack of ROM  (Blank  rows = not tested)  CERVICAL SPECIAL TESTS:  Spurling's test: Negative and Sharp pursor's test: Negative  Active ROM A/PROM (deg) eval 10/09/22  Flexion 39 25  Extension 10 24  Right lateral flexion 7 15  Left lateral flexion 6 20  Right rotation 49 53  Left rotation 30 42   (Blank rows = not tested)    LOWER EXTREMITY MMT:    MMT Right 3/21 Left 3/21  Hip flexion 4+ 4-  Hip extension 4+ 4-  Hip abduction 4+ 4  Hip adduction 4+ 4  Hip internal rotation    Hip external rotation    Knee flexion 4+ 4-  Knee extension 4+ 4  Ankle dorsiflexion 4+ 4  Ankle plantarflexion    Ankle inversion    Ankle eversion     (Blank rows = not tested)  LOWER EXTREMITY ROM:     Passive  Right 3/21 Left 3/21  Hip flexion 120 105  Hip extension    Hip abduction    Hip adduction    Hip internal rotation    Hip external rotation    Knee flexion [facility] WFL  Knee extension (in Hip flexion 90deg) 143 120  Ankle dorsiflexion    Ankle plantarflexion    Ankle inversion    Ankle eversion     (Blank rows = not tested)    LOWER EXTREMITY SPECIAL TESTS:  Hip special tests: Saralyn Pilar (FABER) test: positive , Hip scouring test: negative, and distraction: negative    FUNCTIONAL TESTS:  Not performed at this time.  TODAY'S TREATMENT: DATE: 10/21/22    On large mat table: Manual: HS stretch 2 x 1 min each LE .  Piriformis stretch 2 x 1 min each LE  Hip extensor stretch 2 x 1 min  Suboccipital release x 1 min  Cervical distraction x 30 sec  Cervical rotation L/contract/relax 5 sec contract 10 sec  relax.  Cervical lateral flexion 2 x 30 sec hold bil with overpressure forom PT.   Therex:  SAQ x 15 BLE Partial bridging with bil feet on bed and ball between thighs. on bolster,  LTR x 15 each side.  Supine clam shell 1x15, RTB Chin tuck 2 x 5 with 3 sec hold    PATIENT EDUCATION:  Education details: Pt educated on role of PT and services provided during current POC, along with  prognosis and information about the clinic. Need for Modified PT referral to address new L hip and low back pain. Person educated: Patient and   Education method: Explanation Education comprehension: verbalized understanding  HOME EXERCISE PROGRAM:  Access Code: [email or web address] URL: [email or web address] Date: 09/18/2022 Prepared by: [place]  Exercises - Seated Upper Trapezius Stretch  - 1 x daily - 7 x weekly - 3 sets - 10 reps - Seated Levator Scapulae Stretch  - 1 x daily - 7 x weekly - 3 sets - 10 reps - Seated Scapular Retraction  - 1 x daily - 7 x weekly - 3 sets - 10 reps  ASSESSMENT:  CLINICAL IMPRESSION: Pt tolerates all interventions well without increased pain, demonstrating improvement from previous session. Pt reporting improved cervical ROM and decreased pain in LLE. May benefit from introduction to community gym for transition of care in near future. Pt would benefit from skilled PT to continue to improve cervical ROm as well as address impairments of the LLE for main management and improved strength and function to improve QoL and reduce dependence on husband as caregiver.        OBJECTIVE IMPAIRMENTS: decreased activity tolerance, decreased cognition, decreased ROM, decreased strength, decreased safety awareness, hypomobility, and pain.   ACTIVITY LIMITATIONS: carrying, lifting, bending, sitting, standing, squatting, and reach over head  PARTICIPATION LIMITATIONS: meal prep, cleaning, laundry, driving, shopping, community activity, and yard work  PERSONAL FACTORS: Age, Behavior pattern, Past/current experiences, Time since onset of injury/illness/exacerbation, and 3+ comorbidities: arthritis, DM2, HTN, Obesity,   are also affecting patient's functional outcome.   REHAB POTENTIAL: Good  CLINICAL DECISION MAKING: Stable/uncomplicated  EVALUATION COMPLEXITY: Low   GOALS: Goals reviewed with patient? Yes  SHORT TERM GOALS: Target date:  10/14/2022  Pt will be independent with HEP in order to improve strength of UE's and ROM of the neck in order to return to PLOF. Baseline: Not given at initial evaluation. Goal status: INITIAL    LONG TERM GOALS: Target date: 12/09/2022  Patient will demonstrate improved function as evidenced by a score of 68 on FOTO measure for full participation in activities at home and in the community. Baseline: 65 Goal status: INITIAL  2.  Pt to improve NDI by 5 points or 10% in order to demonstrate a reduction in overall disability caused by neck pain at this time. Baseline: 15/50 or 30% disability Goal status: INITIAL  3.  Pt to demonstrate improved cervical rotation to 60 deg bilaterally in order to perform tasks around the house that require turning of the head. Baseline: R/L Cervical Rotation: 49/30 Goal status: INITIAL  4.  Pt to report 0/10 pain in the neck following light household chores at home in order to demonstrate an overall reduction in pain and an improvement in QoL. Baseline: 4/10 at rest Goal status: INITIAL   PLAN:  PT FREQUENCY: 1-2x/week  PT DURATION: 12 weeks  PLANNED INTERVENTIONS: Therapeutic exercises, Therapeutic activity, Neuromuscular re-education, Balance training, Gait training, Patient/Family education, Self Care, Joint mobilization, Joint manipulation, Vestibular training, Canalith  repositioning, Visual/preceptual remediation/compensation, DME instructions, Dry Needling, Cognitive remediation, Spinal manipulation, Spinal mobilization, Cryotherapy, Moist heat, Traction, Manual therapy, and Re-evaluation    PLAN FOR NEXT SESSION:    Continue to address cervical ROM and strength deficits. Continue to address L hip and low back pain.   Barrie Folk PT, DPT  Physical Therapist - [facility]  1:48 PM 10/21/22

## 2022-10-21 NOTE — Therapy (Signed)
OUTPATIENT OCCUPATIONAL THERAPY TREATMENT NOTE   Patient Name: Alyssa Mayo MRN: [id number] DOB:March 24, 1946, 77 y.o., female Today's Date: 03/14/2022   PCP: [place] Pink, MD REFERRING PROVIDER: Frann Rider, NP     OT End of Session - 10/21/22 1043     Visit Number 9    Number of Visits 24    Date for OT Re-Evaluation 12/09/22    Authorization Type Progress reporting period starting 09/16/2022    OT Start Time 1018    OT Stop Time 1100    OT Time Calculation (min) 42 min    Activity Tolerance Patient tolerated treatment well    Behavior During Therapy [facility] for tasks assessed/performed                                    Past Medical History:  Diagnosis Date   Abnormal levels of other serum enzymes 07/17/2015   Arthritis     Constipation 07/11/2015   COVID-19 03/06/2021   Diabetes mellitus without complication ([place])     Elevated liver enzymes     Fatty liver     Generalized abdominal pain 07/11/2015   Hyperlipidemia     Hypertension     Lifelong obesity     Macular degeneration     Morbid obesity due to excess calories ([place]) 07/11/2015   Other fatigue 07/11/2015   Sleep apnea     Spinal headache           Past Surgical History:  Procedure Laterality Date   ABDOMINAL HYSTERECTOMY       APPENDECTOMY       CATARACT EXTRACTION       CERVICAL LAMINECTOMY   07/21/1985   CESAREAN SECTION       COLONOSCOPY   07/21/2012    diverticulosis, [facility] Dr. Donnella Sham    COLONOSCOPY WITH PROPOFOL N/A 02/04/2019    Procedure: COLONOSCOPY WITH PROPOFOL;  Surgeon: Lollie Sails, MD;  Location: [facility] ENDOSCOPY;  Service: Endoscopy;  Laterality: N/A;   COLONOSCOPY WITH PROPOFOL N/A 03/18/2021    Procedure: COLONOSCOPY WITH PROPOFOL;  Surgeon: Lesly Rubenstein, MD;  Location: [facility] ENDOSCOPY;  Service: Endoscopy;  Laterality: N/A;  DM   DIAGNOSTIC LAPAROSCOPY       ESOPHAGOGASTRODUODENOSCOPY (EGD) WITH PROPOFOL N/A 02/04/2019    Procedure: ESOPHAGOGASTRODUODENOSCOPY  (EGD) WITH PROPOFOL;  Surgeon: Lollie Sails, MD;  Location: [facility] ENDOSCOPY;  Service: Endoscopy;  Laterality: N/A;   ESOPHAGOGASTRODUODENOSCOPY (EGD) WITH PROPOFOL N/A 03/18/2021    Procedure: ESOPHAGOGASTRODUODENOSCOPY (EGD) WITH PROPOFOL;  Surgeon: Lesly Rubenstein, MD;  Location: [facility] ENDOSCOPY;  Service: Endoscopy;  Laterality: N/A;   LOOP RECORDER INSERTION N/A 08/05/2021    Procedure: LOOP RECORDER INSERTION;  Surgeon: Evans Lance, MD;  Location: [facility] LAB;  Service: Cardiovascular;  Laterality: N/A;   [place]       TUBAL LIGATION            Patient Active Problem List    Diagnosis Date Noted   Paroxysmal atrial fibrillation ([place]) 08/08/2022   Hypercoagulable state due to paroxysmal atrial fibrillation ([place]) 08/08/2022   Cerebral edema ([place]) 08/07/2021   OSA (obstructive sleep apnea) 08/07/2021   Right middle cerebral artery stroke ([place]) 08/07/2021   CVA (cerebral vascular accident) ([place]) 08/01/2021   GERD (gastroesophageal reflux disease) 07/20/2019   Advanced care planning/counseling discussion 12/17/2016   Skin lesions, generalized 11/13/2016   Chronic right hip pain 06/17/2016  Vitamin D deficiency 09/26/2015   Diastasis recti 08/08/2015   Elevated serum GGT level 07/24/2015   Essential hypertension 07/17/2015   Fatty liver 07/17/2015   Elevated alkaline phosphatase level 07/17/2015   Elevated serum glutamic pyruvic transaminase (SGPT) level 07/17/2015   Abnormal finding on EKG 07/11/2015   Morbid obesity ([place]) 07/11/2015   Colon, diverticulosis 07/11/2015   Abdominal wall hernia 07/11/2015   DM (diabetes mellitus), type 2 with neurological complications ([place])     Hyperlipidemia        ONSET DATE: 08/01/2021   REFERRING DIAG: CVA   THERAPY DIAG:  Muscle weakness (generalized)   Rationale for Evaluation and Treatment Rehabilitation   SUBJECTIVE:    SUBJECTIVE STATEMENT:    Pt. Reports doing well today   Pt accompanied by: self    PERTINENT HISTORY:  Pt. is a 77 y.o. female who had a unexpectedly hospitalized from 2/12-2/14/2024 for COVID-19, and a UTI.  Pt. 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 Pt. attended inpatient rehab from 08/07/2021-08/22/2021. Pt. received Home health therapy services this past spring.  Pt. has recently had an assessment through driver rehabilitation services at [facility] with recommendations for referrals for  outpatient OT/PT, and ST services. Pt. PMHx includes: HTN, Hyperlipidemia, Macular degeneration, DM, and Obesity.   PRECAUTIONS: None   WEIGHT BEARING RESTRICTIONS No   PAIN:  Are you having pain? 1-2 left shoulder pain   FALLS: Has patient fallen in last 6 months? Yes.   LIVING ENVIRONMENT: Lives with: lives with their family  Son  Alyssa Mayo Lives in: House/apartment Main living are on one floor Stairs:  2 steps to enter Has following equipment at home: Single point cane, Wheelchair (manual), shower chair, Grab bars, bed rail, and rubber mat.   PLOF: Independent   PATIENT GOALS: To be able to Drive, and cook again   OBJECTIVE:    HAND DOMINANCE: Right    ADLs:  Transfers/ambulation related to ADLs: Independent Eating: Independent Grooming: [employer] Pt. reports being unable to use her left hand to perform haircare.  UB Dressing: Independent pullover shirt, independent now with fastening a bra LB Dressing: Independent donning pants socks, and slide on shoes Toileting: Independent Bathing: Independent Tub Shower transfers: Walk-in shower Independent now     IADLs: Shopping: Needs to be accompanied to the grocery store. Light housekeeping: Do  Meal Prep:  Is able to perform light meal prep Community mobility: Relies on son, and friend Medication management: Son sets up Building surveyor management:TBD Handwriting: TBD   MOBILITY STATUS: Hx of falls out of bed     ACTIVITY TOLERANCE: Activity tolerance:  10-20 min.  Before rest break   FUNCTIONAL OUTCOME MEASURES: FOTO: 61  TR score: 62  10/09/2022: FOTO: 49   UPPER EXTREMITY ROM      Active ROM Right eval Left eval Left  10/09/2022  Shoulder flexion WFL 54(74) scaption 0(55)  Shoulder abduction WFL 58(75) 32(54)  Shoulder adduction       Shoulder extension       Shoulder internal rotation       Shoulder external rotation       Elbow flexion [facility] WFL 145  Elbow extension [facility] WFL 10(0)  Wrist flexion WFL TBD WFL  Wrist extension WFL TBD WFL  Wrist ulnar deviation       Wrist radial deviation       Wrist pronation       Wrist supination       (Blank rows =  not tested)     UPPER EXTREMITY MMT:      MMT Right eval Left eval Left 10/09/2022  Shoulder flexion 4+/5 2+/5 1/5  Shoulder abduction 4+/5 2+/5 2-/5  Shoulder adduction       Shoulder extension       Shoulder internal rotation       Shoulder external rotation       Middle trapezius       Lower trapezius       Elbow flexion 4+/5 3+/5 3/5  Elbow extension 4+/5 3+/5 3/5  Wrist flexion 4+/5 TBD 3/5  Wrist extension 4+/5 TBD 3/5  Wrist ulnar deviation       Wrist radial deviation       Wrist pronation       Wrist supination       (Blank rows = not tested)   HAND FUNCTION: Grip strength: Right: 33 lbs; Left: 11 lbs, Lateral pinch: Right: 17 lbs, Left: 2 lbs, and 3 point pinch: Right: 17 lbs, Left: 2 lbs  10/09/2022: Grip strength: Right: 33 lbs; Left: 10 lbs, Lateral pinch: Right: 17 lbs, Left: 1 lbs, and 3 point pinch: Right: 17 lbs, Left: 1 lbs   COORDINATION:   9 Hole Peg test: Right: 27 sec.  sec; Left: 52 sec.  10/09/2022: 9 Hole Peg test: Right: 27 sec.  sec; Left: 1 min. & 35 sec.   SENSATION: Light touch: WFL Proprioception: WFL   COGNITION: Overall cognitive status: Within functional limits for tasks assessed   VISION: Subjective report: Glasses. Just received a new prescription to increase bifocal strength Baseline vision: Macular Degeneration Visual  history: macular degeneration   VISION ASSESSMENT: To be further assessed in functional context  Left sided Awareness      PERCEPTION: Limited left sided awareness     TODAY'S TREATMENT:    Therapeutic Ex:   Pt. worked on [employer], and reciprocal motion using the UBE while seated for 8 min. with no resistance. Constant monitoring was provided.  Neuromuscular re-education:  Pt. worked on [facility] skills grasping 1" sticks. Pt. worked on storing the objects in the palm, and translatory skills moving the items from the palm of the hand to the tip of the 2nd digit, and thumb. Pt. worked on removing the pegs using the left hand alternating hand patterns.   PATIENT EDUCATION: Education details: Compensatory strategies during typing. Person educated: Patient Education method: verbal cues Education comprehension: verbalized understanding and pt does not plan to cook unless son is present.     HOME EXERCISE PROGRAM:  Reviewed home activities to enhance left hand digit extension  during typing skills.       GOALS: GOALS: Goals reviewed with patient? Yes     SHORT TERM GOALS: Target date: 10/28/2022     Pt. Will improve FOTO score by 2 points to reflect improved pt. perceived functional performance Baseline:  10/09/2022: FOTO 49, FOTO: 61 TR score: 62  Goal status: INITIAL   LONG TERM GOALS: Target date: 12/09/2022    Pt. Will improve left shoulder ROM by 10 degrees to be able able to efficiently apply deodorant Baseline: 10/09/2022: flexion: 0(55), Abduction: 32(54) Goal status:Revised from strength to ROM   2.  Pt. will be able to independently hold and use a blow dryer, and brush for hair care. Baseline: 10/09/2022: Pt. Is unable to hold a blow dryer. Eval: Pt. Is unable to sustain her BUEs in elevation, and use a blow dryer, and brush. Goal status: Ongoing  3.  Pt. Will improve left lateral pinch strength by 3# to be able to independently cut meat. Baseline: 10/09/2022:  1#  Eval: 2#. Pt. has difficulty stabilizing utensil, and food while cutting food. Goal status: Ongoing   4.  Pt. Will improve Left hand [facility] skills to be able to be able to independently, and efficiently manipulate small objects during ADL tasks. Baseline 10/09/2022: Left: 1 min & 35 sec.: Eval: Left: 52 sec. Right: 27 sec. Goal status: Ongoing     6.: Pt. will improve with left grip strength to be able to hold and pull up covers.             Baseline: 10/09/2022: Left: 10# Eval:  Left grip strength 11#. Pt. has difficulty holding a full drink  securely with the left hand.             Goal status: INITIAL   7. Pt. Will improve typing speed, and accuracy in preparation for efficiently typing simple email message. Baseline: 10/09/2022: Pt. presents with difficulty typing. Pt. presents with difficulty typing an email. Typing speed, and accuracy TBD             Goal status: INITIAL                                        CLINICAL IMPRESSION:  Pt.  reports 1-2/10 LUE pain today. Pt. continues to present with decreased LUE ROM, strength, and  limited LUE functioning. Pt. Required set-up, however required less hand-over-hand assist to movement. Pt. Required step-by-step cues, and cues for visual demonstration of each task. Pt. dropped multiple 1" sticks from the palm of her hand during translatory movements. Pt. required the position of the pegs to be modified, and adjusted to the front row of the pegboard. The position of the board was modified to accommodate 2/2 limited LUE reach.  Pt. requires continued OT services to  work towards improving L functioning in order to maximize engagement, and independence with these tasks. Goals have been modified.     Marland Kitchen    PERFORMANCE DEFICITS in functional skills including ADLs, IADLs, coordination, dexterity, sensation, ROM, strength, FMC, decreased knowledge of use of DME, vision, and UE functional use, cognitive skills including attention, and psychosocial  skills including coping strategies, environmental adaptation, habits, and routines and behaviors.    IMPAIRMENTS are limiting patient from ADLs, IADLs, and social participation.    COMORBIDITIES may have co-morbidities  that affects occupational performance. Patient will benefit from skilled OT to address above impairments and improve overall function.   MODIFICATION OR ASSISTANCE TO COMPLETE EVALUATION: Min-Moderate modification of tasks or assist with assess necessary to complete an evaluation.   OT OCCUPATIONAL PROFILE AND HISTORY: Detailed assessment: Review of records and additional review of physical, cognitive, psychosocial history related to current functional performance.   CLINICAL DECISION MAKING: Moderate - several treatment options, min-mod task modification necessary   REHAB POTENTIAL: Good   EVALUATION COMPLEXITY: Moderate      PLAN: OT FREQUENCY: 2x/week   OT DURATION: 12 weeks   PLANNED INTERVENTIONS: self care/ADL training, therapeutic exercise, therapeutic activity, neuromuscular re-education, manual therapy, passive range of motion, and paraffin   RECOMMENDED OTHER SERVICES: ST, and PT   CONSULTED AND AGREED WITH PLAN OF CARE: Patient   PLAN FOR NEXT SESSION:  L hand strengthening and coordination exercises   Harrel Carina, MS, OTR/L

## 2022-10-23 ENCOUNTER — Encounter: Payer: Medicare HMO | Admitting: Speech Pathology

## 2022-10-23 ENCOUNTER — Ambulatory Visit: Payer: Medicare HMO | Admitting: Occupational Therapy

## 2022-10-23 ENCOUNTER — Ambulatory Visit: Payer: Medicare HMO | Admitting: Physical Therapy

## 2022-10-23 DIAGNOSIS — M6281 Muscle weakness (generalized): Secondary | ICD-10-CM

## 2022-10-23 DIAGNOSIS — R2681 Unsteadiness on feet: Secondary | ICD-10-CM

## 2022-10-23 DIAGNOSIS — I63511 Cerebral infarction due to unspecified occlusion or stenosis of right middle cerebral artery: Secondary | ICD-10-CM

## 2022-10-23 DIAGNOSIS — M542 Cervicalgia: Secondary | ICD-10-CM

## 2022-10-23 DIAGNOSIS — M25552 Pain in left hip: Secondary | ICD-10-CM

## 2022-10-23 DIAGNOSIS — R262 Difficulty in walking, not elsewhere classified: Secondary | ICD-10-CM

## 2022-10-23 NOTE — Therapy (Signed)
OUTPATIENT PHYSICAL THERAPY  TREATMENT PHYSICAL THERAPY PROGRESS NOTE   Dates of reporting period  09/16/2022   to   10/23/2022    Patient Name: Alyssa Mayo MRN: [id number] DOB:1946-03-02, 77 y.o., female Today's Date: 10/23/2022  END OF SESSION:   PT End of Session - 10/23/22 1105     Visit Number 10    Number of Visits 12    Date for PT Re-Evaluation 12/09/22    Authorization Type [employer]    Progress Note Due on Visit 10    PT Start Time 1102    Equipment Utilized During Treatment Gait belt    Activity Tolerance Patient tolerated treatment well;No increased pain    Behavior During Therapy [facility] for tasks assessed/performed                Past Medical History:  Diagnosis Date   Abnormal levels of other serum enzymes 07/17/2015   Arthritis    Constipation 07/11/2015   COVID-19 03/06/2021   Diabetes mellitus without complication (HCC)    Elevated liver enzymes    Fatty liver    Generalized abdominal pain 07/11/2015   Hyperlipidemia    Hypertension    Lifelong obesity    Macular degeneration    Morbid obesity due to excess calories (HCC) 07/11/2015   Other fatigue 07/11/2015   Sleep apnea    Spinal headache    Past Surgical History:  Procedure Laterality Date   ABDOMINAL HYSTERECTOMY     APPENDECTOMY     CATARACT EXTRACTION     CERVICAL LAMINECTOMY  07/21/1985   CESAREAN SECTION     COLONOSCOPY  07/21/2012   diverticulosis, [facility] Dr. [name]    COLONOSCOPY WITH PROPOFOL N/A 02/04/2019   Procedure: COLONOSCOPY WITH PROPOFOL;  Surgeon: [name], MD;  Location: [facility] ENDOSCOPY;  Service: Endoscopy;  Laterality: N/A;   COLONOSCOPY WITH PROPOFOL N/A 03/18/2021   Procedure: COLONOSCOPY WITH PROPOFOL;  Surgeon: [name], MD;  Location: [facility] ENDOSCOPY;  Service: Endoscopy;  Laterality: N/A;  DM   DIAGNOSTIC LAPAROSCOPY     ESOPHAGOGASTRODUODENOSCOPY (EGD) WITH PROPOFOL N/A 02/04/2019   Procedure: ESOPHAGOGASTRODUODENOSCOPY (EGD) WITH  PROPOFOL;  Surgeon: [name], MD;  Location: [facility] ENDOSCOPY;  Service: Endoscopy;  Laterality: N/A;   ESOPHAGOGASTRODUODENOSCOPY (EGD) WITH PROPOFOL N/A 03/18/2021   Procedure: ESOPHAGOGASTRODUODENOSCOPY (EGD) WITH PROPOFOL;  Surgeon: [name], MD;  Location: [facility] ENDOSCOPY;  Service: Endoscopy;  Laterality: N/A;   LOOP RECORDER INSERTION N/A 08/05/2021   Procedure: LOOP RECORDER INSERTION;  Surgeon: [name], MD;  Location: [facility] INVASIVE CV LAB;  Service: Cardiovascular;  Laterality: N/A;   SPINE SURGERY     TUBAL LIGATION     Patient Active Problem List   Diagnosis Date Noted   UTI (urinary tract infection) 09/01/2022   COVID-19 virus infection 09/01/2022   Chronic diastolic CHF (congestive heart failure) 09/01/2022   Chronic kidney disease, stage 3a 09/01/2022   Obesity (BMI 30-39.9) 09/01/2022   Paroxysmal atrial fibrillation 08/08/2022   Hypercoagulable state due to paroxysmal atrial fibrillation 08/08/2022   Cerebral edema 08/07/2021   OSA (obstructive sleep apnea) 08/07/2021   Right middle cerebral artery stroke 08/07/2021   CVA (cerebral vascular accident) 08/01/2021   GERD (gastroesophageal reflux disease) 07/20/2019   Advanced care planning/counseling discussion 12/17/2016   Skin lesions, generalized 11/13/2016   Chronic right hip pain 06/17/2016   Vitamin D deficiency 09/26/2015   Diastasis recti 08/08/2015   Elevated serum GGT level 07/24/2015   Essential hypertension 07/17/2015  Fatty liver 07/17/2015   Elevated alkaline phosphatase level 07/17/2015   Elevated serum glutamic pyruvic transaminase (SGPT) level 07/17/2015   Abnormal finding on EKG 07/11/2015   Morbid obesity 07/11/2015   Colon, diverticulosis 07/11/2015   Abdominal wall hernia 07/11/2015   DM (diabetes mellitus), type 2 with neurological complications    Hyperlipidemia     PCP: [name], MD  REFERRING PROVIDER: [name], MD & Edilia Bo,  [place]  REFERRING DIAG: [id number] (ICD-10-CM) - Cerebral infarction due to unspecified occlusion or stenosis of unspecified precerebral arteries, Cervicalgia with degenerative disc  THERAPY DIAG:  Muscle weakness (generalized)  Pain in left hip  Cervical pain  Difficulty in walking, not elsewhere classified  Unsteadiness on feet  Right middle cerebral artery stroke  Painful cervical ROM  Rationale for Evaluation and Treatment: Rehabilitation  ONSET DATE: 09/03/22  SUBJECTIVE:                                                                                                                                                                                                         SUBJECTIVE STATEMENT:  Pt reports she sore today. Pt did no sleed well, so neck is tight and both the L shoulder and hip are hurting.    PERTINENT HISTORY:   Per chart and confirmed by pt: Pt is a 77 yo female with RMCA CVA infarction with hemorrhagic transformation 08/01/2021, with inpatient rehab 08/07/2021-08/22/2021 followed by [facility] therapy until June. Pt now currently being seen for outpatient OT and ST services. Pt reports limitations in stamina persist. Other PMH is significant for HTN, HLD, macular degeneration, DM, obesity, sleep apnea, arthritis, chronic R hip pain, vitamin D deficiency, diastasis recti, abdominal wall hernia, hx of abdominal hysterectomy, appendectomy, spine surgery (years ago).   PAIN:  Are you having pain? Yes: NPRS scale: 6/10 Pain location: L shoulder/ L hip  Pain description: dull ache Aggravating factors: sitting Relieving factors: laying down  PRECAUTIONS: None  WEIGHT BEARING RESTRICTIONS: No  FALLS:  Has patient fallen in last 6 months? Yes. Number of falls 2  LIVING ENVIRONMENT: Lives with: lives with their son Lives in: House/apartment Stairs: Yes: Internal: 13 steps; doesn't go up to the second floor and External: 4 steps; can reach both Has following equipment at home:  Single point cane and Walker - 2 wheeled  OCCUPATION: Retired  PLOF: Independent with basic ADLs  PATIENT GOALS: "To make my neck quit hurting and to prevent it from getting worse"  NEXT MD VISIT: Posey Rea, but maybe May  OBJECTIVE:   DIAGNOSTIC FINDINGS:  CT HEAD WITHOUT CONTRAST  CT CERVICAL SPINE WITHOUT CONTRAST IMPRESSION: 1. No acute intracranial pathology. 2. Expected interval evolution of encephalomalacia of the right frontal lobe, following intraparenchymal hemorrhage seen acutely on examination dated 08/03/2021. 3. No fracture or static subluxation of the cervical spine. 4. Generally mild disc space height loss and osteophytosis throughout the cervical spine, with partial ankylosis of C5-C6 and a large left eccentric posterior bridging osteophyte at this level. MRI may be used to further evaluate cervical disc and neural foraminal pathology if indicated by neurologically localizing signs and symptoms.  EXAM: xRay PELVIS - 1-2 VIEW  IMPRESSION: 1. No convincing evidence of acute fracture or diastasis of the pelvis. 2. Well corticated osseous focus beneath the right inferior pubic ramus may reflect sequela of avulsion injury or chronic tendinopathy, suggest correlation with direct tenderness to palpation.   PATIENT SURVEYS:  NDI 15/50 = 30% disability FOTO 65/68   10/23/2022 : 52/68  10/23/2022 Foto CVA 57/59  COGNITION: Overall cognitive status: History of cognitive impairments - at baseline  SENSATION: WFL  POSTURE: rounded shoulders and forward head  PALPATION:     CERVICAL ROM:   Active ROM A/PROM (deg) eval 3/21 4/4  Flexion 39 25 39  Extension 10 24 25   Right lateral flexion 7 15 15   Left lateral flexion 6 20 14   Right rotation 49 53 41  Left rotation 30 42 35   (Blank rows = not tested)  UPPER EXTREMITY ROM:  Active ROM Right eval Left eval Right 4/4 Left 4/4  Shoulder flexion 128 81 149 22  Shoulder abduction 160 68  158  38    (Blank rows = not tested)   UPPER EXTREMITY MMT:  Grossly decreased (3+/5) in the L UE due to pain and lack of ROM  (Blank rows = not tested)  CERVICAL SPECIAL TESTS:  Spurling's test: Negative and Sharp pursor's test: Negative  Active ROM A/PROM (deg) eval   Flexion 39   Extension 10   Right lateral flexion 7   Left lateral flexion 6   Right rotation 49   Left rotation 30    (Blank rows = not tested)    LOWER EXTREMITY MMT:    MMT Right 3/21 Left 3/21  Hip flexion 4+ 4-  Hip extension 4+ 4-  Hip abduction 4+ 4  Hip adduction 4+ 4  Hip internal rotation    Hip external rotation    Knee flexion 4+ 4-  Knee extension 4+ 4  Ankle dorsiflexion 4+ 4  Ankle plantarflexion    Ankle inversion    Ankle eversion     (Blank rows = not tested)  LOWER EXTREMITY ROM:     Passive  Right 3/21 Left 3/21  Hip flexion 120 105  Hip extension    Hip abduction    Hip adduction    Hip internal rotation    Hip external rotation    Knee flexion [facility] WFL  Knee extension (in Hip flexion 90deg) 143 120  Ankle dorsiflexion    Ankle plantarflexion    Ankle inversion    Ankle eversion     (Blank rows = not tested)    LOWER EXTREMITY SPECIAL TESTS:  Hip special tests: Luisa Hart (FABER) test: positive , Hip scouring test: negative, and distraction: negative    FUNCTIONAL TESTS:  Not performed at this time.  TODAY'S TREATMENT: DATE: 10/23/22  Goal assessment. See above/below.   On large mat table: Manual: HS stretch  x 1 min each LE .  Piriformis stretch  x 1 min each LE  Hip extensor stretch  x 1 min  Suboccipital release x 1 min  Seated UT STM and trigger point release. X 2 min   Cervical rotation and lateral flexion to the R with trigger point release x 1 min each.   PATIENT EDUCATION:  Education details: Pt educated on role of PT and services provided during current POC, along with prognosis and information about the clinic. Need for Modified PT referral to  address new L hip and low back pain. Person educated: Patient and   Education method: Explanation Education comprehension: verbalized understanding  HOME EXERCISE PROGRAM:  Access Code: [email or web address] URL: [email or web address] Date: 09/18/2022 Prepared by: Tomasa Hose  Exercises - Seated Upper Trapezius Stretch  - 1 x daily - 7 x weekly - 3 sets - 10 reps - Seated Levator Scapulae Stretch  - 1 x daily - 7 x weekly - 3 sets - 10 reps - Seated Scapular Retraction  - 1 x daily - 7 x weekly - 3 sets - 10 reps  ASSESSMENT:  CLINICAL IMPRESSION: Pt tolerates all interventions well without increased pain. Pt had been progressing with reduction of cervical pain with this PTA, but ha a fell several weeks ago resulting in trip to ED. Since fall, Pt continues to demonstrate mild decreased cervical ROM and significantly decreased L shoulder with shoulder flexion limited to 22deg and abduction limited to 38 deg. Continued L hip pain near L ischial tuberosity is also present. Question potential for soft tissue injury at time of fall in the L shoulder labral tear vs rotator cuff tear.  Pt would benefit from skilled PT to continue to improve cervical ROM as well as address impairments of the LLE for main management and improved strength and function to improve QoL and reduce dependence on husband as caregiver.        OBJECTIVE IMPAIRMENTS: decreased activity tolerance, decreased cognition, decreased ROM, decreased strength, decreased safety awareness, hypomobility, and pain.   ACTIVITY LIMITATIONS: carrying, lifting, bending, sitting, standing, squatting, and reach over head  PARTICIPATION LIMITATIONS: meal prep, cleaning, laundry, driving, shopping, community activity, and yard work  PERSONAL FACTORS: Age, Behavior pattern, Past/current experiences, Time since onset of injury/illness/exacerbation, and 3+ comorbidities: arthritis, DM2, HTN, Obesity,   are also affecting patient's  functional outcome.   REHAB POTENTIAL: Good  CLINICAL DECISION MAKING: Stable/uncomplicated  EVALUATION COMPLEXITY: Low   GOALS: Goals reviewed with patient? Yes  SHORT TERM GOALS: Target date: 10/14/2022  Pt will be independent with HEP in order to improve strength of UE's and ROM of the neck in order to return to PLOF. Baseline: provided on 09/18/22 Goal status: MET    LONG TERM GOALS: Target date: 12/09/2022  Patient will demonstrate improved function as evidenced by a score of 68 on FOTO measure for full participation in activities at home and in the community. Baseline: 65. 10/23/22: 52 Goal status: IN PROGRESS  2.  Pt to improve NDI by 5 points or 10% in order to demonstrate a reduction in overall disability caused by neck pain at this time. Baseline: 15/50 or 30% disability Goal status: INITIAL  3.  Pt to demonstrate improved cervical rotation to 60 deg bilaterally in order to perform tasks around the house that require turning of the head. Baseline: R/L Cervical Rotation: 49/30. 4/424 R:53. L:42 Goal status: IN PROGRESS  4.  Pt to report 0/10 pain in the neck following light household chores at home in order to demonstrate an  overall reduction in pain and an improvement in QoL. Baseline: 4/10 at rest 0/10 in neck Goal status: MET   PLAN:  PT FREQUENCY: 1-2x/week  PT DURATION: 12 weeks  PLANNED INTERVENTIONS: Therapeutic exercises, Therapeutic activity, Neuromuscular re-education, Balance training, Gait training, Patient/Family education, Self Care, Joint mobilization, Joint manipulation, Vestibular training, Canalith repositioning, Visual/preceptual remediation/compensation, DME instructions, Dry Needling, Cognitive remediation, Spinal manipulation, Spinal mobilization, Cryotherapy, Moist heat, Traction, Manual therapy, and Re-evaluation    PLAN FOR NEXT SESSION:    Continue to address cervical ROM and strength deficits.  Continue to address L hip and low back  pain.   Grier Rocher PT, DPT  Physical Therapist - [facility]  11:06 AM 10/23/22

## 2022-10-23 NOTE — Therapy (Signed)
Occupational Therapy Progress Note  Dates of reporting period  09/16/2022   to   10/23/2022    Patient Name: Rosita Babauta MRN: [id number] DOB:02/26/1946, 77 y.o., female Today's Date: 03/14/2022   PCP: [place] Pink, MD REFERRING PROVIDER: Frann Rider, NP     OT End of Session - 10/23/22 1052     Visit Number 10    Number of Visits 24    Date for OT Re-Evaluation 12/09/22    Authorization Type Progress reporting period starting 09/16/2022    OT Start Time 1045    OT Stop Time 1100    OT Time Calculation (min) 15 min    Activity Tolerance Patient tolerated treatment well    Behavior During Therapy [facility] for tasks assessed/performed                                    Past Medical History:  Diagnosis Date   Abnormal levels of other serum enzymes 07/17/2015   Arthritis     Constipation 07/11/2015   COVID-19 03/06/2021   Diabetes mellitus without complication ([place])     Elevated liver enzymes     Fatty liver     Generalized abdominal pain 07/11/2015   Hyperlipidemia     Hypertension     Lifelong obesity     Macular degeneration     Morbid obesity due to excess calories ([place]) 07/11/2015   Other fatigue 07/11/2015   Sleep apnea     Spinal headache           Past Surgical History:  Procedure Laterality Date   ABDOMINAL HYSTERECTOMY       APPENDECTOMY       CATARACT EXTRACTION       CERVICAL LAMINECTOMY   07/21/1985   CESAREAN SECTION       COLONOSCOPY   07/21/2012    diverticulosis, [facility] Dr. Donnella Sham    COLONOSCOPY WITH PROPOFOL N/A 02/04/2019    Procedure: COLONOSCOPY WITH PROPOFOL;  Surgeon: Lollie Sails, MD;  Location: [facility] ENDOSCOPY;  Service: Endoscopy;  Laterality: N/A;   COLONOSCOPY WITH PROPOFOL N/A 03/18/2021    Procedure: COLONOSCOPY WITH PROPOFOL;  Surgeon: Lesly Rubenstein, MD;  Location: [facility] ENDOSCOPY;  Service: Endoscopy;  Laterality: N/A;  DM   DIAGNOSTIC LAPAROSCOPY       ESOPHAGOGASTRODUODENOSCOPY (EGD) WITH PROPOFOL N/A  02/04/2019    Procedure: ESOPHAGOGASTRODUODENOSCOPY (EGD) WITH PROPOFOL;  Surgeon: Lollie Sails, MD;  Location: [facility] ENDOSCOPY;  Service: Endoscopy;  Laterality: N/A;   ESOPHAGOGASTRODUODENOSCOPY (EGD) WITH PROPOFOL N/A 03/18/2021    Procedure: ESOPHAGOGASTRODUODENOSCOPY (EGD) WITH PROPOFOL;  Surgeon: Lesly Rubenstein, MD;  Location: [facility] ENDOSCOPY;  Service: Endoscopy;  Laterality: N/A;   LOOP RECORDER INSERTION N/A 08/05/2021    Procedure: LOOP RECORDER INSERTION;  Surgeon: Evans Lance, MD;  Location: [place] CV LAB;  Service: Cardiovascular;  Laterality: N/A;   [place]       TUBAL LIGATION            Patient Active Problem List    Diagnosis Date Noted   Paroxysmal atrial fibrillation ([place]) 08/08/2022   Hypercoagulable state due to paroxysmal atrial fibrillation ([place]) 08/08/2022   Cerebral edema ([place]) 08/07/2021   OSA (obstructive sleep apnea) 08/07/2021   Right middle cerebral artery stroke ([place]) 08/07/2021   CVA (cerebral vascular accident) ([place]) 08/01/2021   GERD (gastroesophageal reflux disease) 07/20/2019   Advanced care planning/counseling discussion 12/17/2016  Skin lesions, generalized 11/13/2016   Chronic right hip pain 06/17/2016   Vitamin D deficiency 09/26/2015   Diastasis recti 08/08/2015   Elevated serum GGT level 07/24/2015   Essential hypertension 07/17/2015   Fatty liver 07/17/2015   Elevated alkaline phosphatase level 07/17/2015   Elevated serum glutamic pyruvic transaminase (SGPT) level 07/17/2015   Abnormal finding on EKG 07/11/2015   Morbid obesity ([place]) 07/11/2015   Colon, diverticulosis 07/11/2015   Abdominal wall hernia 07/11/2015   DM (diabetes mellitus), type 2 with neurological complications ([place])     Hyperlipidemia        ONSET DATE: 08/01/2021   REFERRING DIAG: CVA   THERAPY DIAG:  Muscle weakness (generalized)   Rationale for Evaluation and Treatment Rehabilitation   SUBJECTIVE:    SUBJECTIVE STATEMENT:    Pt. was  very late for therapy this morning.   Pt accompanied by: self   PERTINENT HISTORY:  Pt. is a 77 y.o. female who had a unexpectedly hospitalized from 2/12-2/14/2024 for COVID-19, and a UTI.  Pt. 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 Pt. attended inpatient rehab from 08/07/2021-08/22/2021. Pt. received Home health therapy services this past spring.  Pt. has recently had an assessment through driver rehabilitation services at [facility] with recommendations for referrals for  outpatient OT/PT, and ST services. Pt. PMHx includes: HTN, Hyperlipidemia, Macular degeneration, DM, and Obesity.   PRECAUTIONS: None   WEIGHT BEARING RESTRICTIONS No   PAIN:  Are you having pain? 4/10 LE pain    FALLS: Has patient fallen in last 6 months? Yes.   LIVING ENVIRONMENT: Lives with: lives with their family  Son  [name] Lives in: House/apartment Main living are on one floor Stairs:  2 steps to enter Has following equipment at home: Single point cane, Wheelchair (manual), shower chair, Grab bars, bed rail, and rubber mat.   PLOF: Independent   PATIENT GOALS: To be able to Drive, and cook again   OBJECTIVE:    HAND DOMINANCE: Right    ADLs:  Transfers/ambulation related to ADLs: Independent Eating: Independent Grooming: [employer] Pt. reports being unable to use her left hand to perform haircare.  UB Dressing: Independent pullover shirt, independent now with fastening a bra LB Dressing: Independent donning pants socks, and slide on shoes Toileting: Independent Bathing: Independent Tub Shower transfers: Walk-in shower Independent now     IADLs: Shopping: Needs to be accompanied to the grocery store. Light housekeeping: Do  Meal Prep:  Is able to perform light meal prep Community mobility: Relies on son, and friend Medication management: Son sets up Building surveyor management:TBD Handwriting: TBD   MOBILITY STATUS: Hx of falls out of bed      ACTIVITY TOLERANCE: Activity tolerance:  10-20 min. Before rest break   FUNCTIONAL OUTCOME MEASURES: FOTO: 61  TR score: 62  10/09/2022: FOTO: 49   UPPER EXTREMITY ROM      Active ROM Right eval Left eval Left  10/09/2022 Left 10/23/22  Shoulder flexion WFL 54(74) scaption 0(55) 26(80)  Shoulder abduction WFL 58(75) 32(54) 32(50)  Shoulder adduction        Shoulder extension        Shoulder internal rotation        Shoulder external rotation        Elbow flexion [facility] WFL 145 145  Elbow extension [facility] WFL 10(0) 10(0)  Wrist flexion WFL TBD [facility] WFL  Wrist extension WFL TBD [facility] WFL  Wrist ulnar deviation  Wrist radial deviation        Wrist pronation        Wrist supination        (Blank rows = not tested)     UPPER EXTREMITY MMT:      MMT Right eval Left eval Left 10/09/2022 Left 10/23/2022  Shoulder flexion 4+/5 2+/5 1/5 2-/5  Shoulder abduction 4+/5 2+/5 2-/5 2-/5  Shoulder adduction        Shoulder extension        Shoulder internal rotation        Shoulder external rotation        Middle trapezius        Lower trapezius        Elbow flexion 4+/5 3+/5 3/5 3/5  Elbow extension 4+/5 3+/5 3/5 3/5  Wrist flexion 4+/5 TBD 3/5 3/5  Wrist extension 4+/5 TBD 3/5 3/5  Wrist ulnar deviation        Wrist radial deviation        Wrist pronation        Wrist supination        (Blank rows = not tested)   HAND FUNCTION: Grip strength: Right: 33 lbs; Left: 11 lbs, Lateral pinch: Right: 17 lbs, Left: 2 lbs, and 3 point pinch: Right: 17 lbs, Left: 2 lbs  10/09/2022: Grip strength: Right: 33 lbs; Left: 10 lbs, Lateral pinch: Right: 17 lbs, Left: 1 lbs, and 3 point pinch: Right: 17 lbs, Left: 1 lbs   10/23/2022: Grip strength: Right: 33 lbs; Left: 16 lbs, Lateral pinch: Right: 17 lbs, Left: 1 lbs, and 3 point pinch: Right: 17 lbs, Left: 1 lbs   COORDINATION:   9 Hole Peg test: Right: 27 sec.  sec; Left: 52 sec.  10/09/2022: 9 Hole Peg test: Right: 27 sec.  sec; Left:  1 min. & 35 sec.   SENSATION: Light touch: WFL Proprioception: WFL   COGNITION: Overall cognitive status: Within functional limits for tasks assessed   VISION: Subjective report: Glasses. Just received a new prescription to increase bifocal strength Baseline vision: Macular Degeneration Visual history: macular degeneration   VISION ASSESSMENT: To be further assessed in functional context  Left sided Awareness      PERCEPTION: Limited left sided awareness     TODAY'S TREATMENT:    Therapeutic Ex:   Pt. worked on [employer], and reciprocal motion using the UBE while seated for 8 min. with no resistance. Constant monitoring was provided.   PATIENT EDUCATION: Education details: Compensatory strategies during typing. Person educated: Patient Education method: verbal cues Education comprehension: verbalized understanding and pt does not plan to cook unless son is present.     HOME EXERCISE PROGRAM:  Reviewed home activities to enhance left hand digit extension  during typing skills.   GOALS: Goals reviewed with patient? Yes     SHORT TERM GOALS: Target date: 10/28/2022     Pt. Will improve FOTO score by 2 points to reflect improved pt. perceived functional performance Baseline: 10/23/2022: FOTO 49 10/09/2022: FOTO 49, FOTO: 61 TR score: 62  Goal status: INITIAL   LONG TERM GOALS: Target date: 12/09/2022    Pt. Will improve left shoulder ROM by 10 degrees to be able able to efficiently apply deodorant Baseline: 10/23/2022: Shoulder flexion: 26(80), Abduction: 32(50) 10/09/2022: flexion: 0(55), Abduction: 32(54) Goal status: Improving, but very limited.   2.  Pt. will be able to independently hold and use a blow dryer, and brush for hair care. Baseline: 10/23/2022: Pt. Continues to  be unable to hold a blow dryer. Eval: Pt. Is unable to sustain her BUEs in elevation, and use a blow dryer, and brush. 10/09/2022: Pt. Is unable to hold a blow dryer. Eval: Pt. Is unable to  sustain her BUEs in elevation, and use a blow dryer, and brush. Goal status: Ongoing   3.  Pt. Will improve left lateral pinch strength by 3# to be able to independently cut meat. Baseline: 10/23/2022: Pt. Continues to present with difficulty cutting meat. 10/09/2022: 1#  Eval: 2#. Pt. has difficulty stabilizing utensil, and food while cutting food. Goal status: Ongoing   4.  Pt. Will improve Left hand [facility] skills to be able to be able to independently, and efficiently manipulate small objects during ADL tasks. Baseline 10/23/2022: Pt. Continues to present with difficulty manipulating small objects. 10/09/2022: Left: 1 min & 35 sec.: Eval: Left: 52 sec. Right: 27 sec. Goal status: Ongoing     6.: Pt. will improve with left grip strength to be able to hold and pull up covers. Baseline: 10/23/2022: Left: 16# 10/09/2022: Left: 10# Eval:  Left grip strength 11#. Pt. has difficulty holding a full drink  securely with the left hand.             Goal status: Ongoing   7. Pt. Will improve typing speed, and accuracy in preparation for efficiently typing simple email message. Baseline: 10/23/2022: Pt. continues to present with limited speed, and accuracy with typing. 10/09/2022: Pt. presents with difficulty typing. Pt. presents with difficulty typing an email. Typing speed, and accuracy TBD             Goal status: INITIAL                                        CLINICAL IMPRESSION:  Measurements were obtained. Pt. has progressed with left shoulder ROM, and left grip strength, and is trying to engage it more during daily tasks. Pt. presents with significantly limited Left shoulder AROM, and PROM which limits overall LUE functioning during ADLs, and IADL tasks.  Pt. continues to present with limited left hand function, and [facility] skills limiting her ability to grasp small objects. Pt. continues to benefit from OT services to  work towards improving L functioning in order to maximize engagement, and independence with  these tasks. Goals have been modified.     PERFORMANCE DEFICITS in functional skills including ADLs, IADLs, coordination, dexterity, sensation, ROM, strength, FMC, decreased knowledge of use of DME, vision, and UE functional use, cognitive skills including attention, and psychosocial skills including coping strategies, environmental adaptation, habits, and routines and behaviors.    IMPAIRMENTS are limiting patient from ADLs, IADLs, and social participation.    COMORBIDITIES may have co-morbidities  that affects occupational performance. Patient will benefit from skilled OT to address above impairments and improve overall function.   MODIFICATION OR ASSISTANCE TO COMPLETE EVALUATION: Min-Moderate modification of tasks or assist with assess necessary to complete an evaluation.   OT OCCUPATIONAL PROFILE AND HISTORY: Detailed assessment: Review of records and additional review of physical, cognitive, psychosocial history related to current functional performance.   CLINICAL DECISION MAKING: Moderate - several treatment options, min-mod task modification necessary   REHAB POTENTIAL: Good   EVALUATION COMPLEXITY: Moderate      PLAN: OT FREQUENCY: 2x/week   OT DURATION: 12 weeks   PLANNED INTERVENTIONS: self care/ADL training, therapeutic exercise, therapeutic activity, neuromuscular  re-education, manual therapy, passive range of motion, and paraffin   RECOMMENDED OTHER SERVICES: ST, and PT   CONSULTED AND AGREED WITH PLAN OF CARE: Patient   PLAN FOR NEXT SESSION:  L hand strengthening and coordination exercises   Harrel Carina, MS, OTR/L

## 2022-10-24 NOTE — Progress Notes (Signed)
Carelink Summary Report / Loop Recorder 

## 2022-10-28 ENCOUNTER — Ambulatory Visit: Payer: Medicare HMO | Admitting: Physical Therapy

## 2022-10-28 ENCOUNTER — Ambulatory Visit: Payer: Medicare HMO | Admitting: Occupational Therapy

## 2022-10-28 DIAGNOSIS — R262 Difficulty in walking, not elsewhere classified: Secondary | ICD-10-CM

## 2022-10-28 DIAGNOSIS — R2681 Unsteadiness on feet: Secondary | ICD-10-CM

## 2022-10-28 DIAGNOSIS — M6281 Muscle weakness (generalized): Secondary | ICD-10-CM | POA: Diagnosis not present

## 2022-10-28 DIAGNOSIS — R278 Other lack of coordination: Secondary | ICD-10-CM

## 2022-10-28 DIAGNOSIS — M25552 Pain in left hip: Secondary | ICD-10-CM

## 2022-10-28 DIAGNOSIS — M542 Cervicalgia: Secondary | ICD-10-CM

## 2022-10-28 NOTE — Therapy (Signed)
OUTPATIENT PHYSICAL THERAPY  TREATMENT   Patient Name: Alyssa Mayo MRN: [id number] DOB:Aug 10, 1945, 77 y.o., female Today's Date: 10/28/2022  END OF SESSION:   PT End of Session - 10/28/22 1104     Visit Number 11    Number of Visits 12    Date for PT Re-Evaluation 12/09/22    Authorization Type [employer]    Progress Note Due on Visit 10    PT Start Time 1103    PT Stop Time 1145    PT Time Calculation (min) 42 min    Equipment Utilized During Treatment Gait belt    Activity Tolerance Patient tolerated treatment well;No increased pain    Behavior During Therapy [facility] for tasks assessed/performed                 Past Medical History:  Diagnosis Date   Abnormal levels of other serum enzymes 07/17/2015   Arthritis    Constipation 07/11/2015   COVID-19 03/06/2021   Diabetes mellitus without complication (HCC)    Elevated liver enzymes    Fatty liver    Generalized abdominal pain 07/11/2015   Hyperlipidemia    Hypertension    Lifelong obesity    Macular degeneration    Morbid obesity due to excess calories (HCC) 07/11/2015   Other fatigue 07/11/2015   Sleep apnea    Spinal headache    Past Surgical History:  Procedure Laterality Date   ABDOMINAL HYSTERECTOMY     APPENDECTOMY     CATARACT EXTRACTION     CERVICAL LAMINECTOMY  07/21/1985   CESAREAN SECTION     COLONOSCOPY  07/21/2012   diverticulosis, [facility] Dr. [name]    COLONOSCOPY WITH PROPOFOL N/A 02/04/2019   Procedure: COLONOSCOPY WITH PROPOFOL;  Surgeon: [name], MD;  Location: [facility] ENDOSCOPY;  Service: Endoscopy;  Laterality: N/A;   COLONOSCOPY WITH PROPOFOL N/A 03/18/2021   Procedure: COLONOSCOPY WITH PROPOFOL;  Surgeon: [name], MD;  Location: [facility] ENDOSCOPY;  Service: Endoscopy;  Laterality: N/A;  DM   DIAGNOSTIC LAPAROSCOPY     ESOPHAGOGASTRODUODENOSCOPY (EGD) WITH PROPOFOL N/A 02/04/2019   Procedure: ESOPHAGOGASTRODUODENOSCOPY (EGD) WITH PROPOFOL;  Surgeon: [name], MD;  Location: [facility] ENDOSCOPY;  Service: Endoscopy;  Laterality: N/A;   ESOPHAGOGASTRODUODENOSCOPY (EGD) WITH PROPOFOL N/A 03/18/2021   Procedure: ESOPHAGOGASTRODUODENOSCOPY (EGD) WITH PROPOFOL;  Surgeon: [name], MD;  Location: [facility] ENDOSCOPY;  Service: Endoscopy;  Laterality: N/A;   LOOP RECORDER INSERTION N/A 08/05/2021   Procedure: LOOP RECORDER INSERTION;  Surgeon: [name], MD;  Location: [facility] INVASIVE CV LAB;  Service: Cardiovascular;  Laterality: N/A;   SPINE SURGERY     TUBAL LIGATION     Patient Active Problem List   Diagnosis Date Noted   UTI (urinary tract infection) 09/01/2022   COVID-19 virus infection 09/01/2022   Chronic diastolic CHF (congestive heart failure) 09/01/2022   Chronic kidney disease, stage 3a 09/01/2022   Obesity (BMI 30-39.9) 09/01/2022   Paroxysmal atrial fibrillation 08/08/2022   Hypercoagulable state due to paroxysmal atrial fibrillation 08/08/2022   Cerebral edema 08/07/2021   OSA (obstructive sleep apnea) 08/07/2021   Right middle cerebral artery stroke 08/07/2021   CVA (cerebral vascular accident) 08/01/2021   GERD (gastroesophageal reflux disease) 07/20/2019   Advanced care planning/counseling discussion 12/17/2016   Skin lesions, generalized 11/13/2016   Chronic right hip pain 06/17/2016   Vitamin D deficiency 09/26/2015   Diastasis recti 08/08/2015   Elevated serum GGT level 07/24/2015   Essential hypertension 07/17/2015  Fatty liver 07/17/2015   Elevated alkaline phosphatase level 07/17/2015   Elevated serum glutamic pyruvic transaminase (SGPT) level 07/17/2015   Abnormal finding on EKG 07/11/2015   Morbid obesity 07/11/2015   Colon, diverticulosis 07/11/2015   Abdominal wall hernia 07/11/2015   DM (diabetes mellitus), type 2 with neurological complications    Hyperlipidemia     PCP: Jerl MinaHedrick, James, MD  REFERRING PROVIDER: Jerl MinaHedrick, James, MD & Edilia BoMundy, Jonathan Todd, [place]  REFERRING DIAG: [phone] (ICD-10-CM) -  Cerebral infarction due to unspecified occlusion or stenosis of unspecified precerebral arteries, Cervicalgia with degenerative disc  THERAPY DIAG:  Muscle weakness (generalized)  Pain in left hip  Cervical pain  Difficulty in walking, not elsewhere classified  Unsteadiness on feet  Rationale for Evaluation and Treatment: Rehabilitation  ONSET DATE: 09/03/22  SUBJECTIVE:                                                                                                                                                                                                         SUBJECTIVE STATEMENT:  Pt reports she has no pain at start of PT treatment. States that Neck and L hip/low back are not bothering her this morning. Pt states that she has appointment with Dr Burnett ShengHedrick next week with plans to discuss new limitation in L shoulder mobility. OT. Olegario MessierElaine Jagentenfl, MS, OTR/L reports that she reached out MD office to notify team of continued ROM loss and pain with movement in the L shoulder. Pt wishes to continue PT to improve L hip strength as approved by MD .     PERTINENT HISTORY:   Per chart and confirmed by pt: Pt is a 77 yo female with RMCA CVA infarction with hemorrhagic transformation 08/01/2021, with inpatient rehab 08/07/2021-08/22/2021 [facility]. Pt now currently being seen for outpatient OT and ST services. Pt reports limitations in stamina persist. Other PMH is significant for HTN, HLD, macular degeneration, DM, obesity, sleep apnea, arthritis, chronic R hip pain, vitamin D deficiency, diastasis recti, abdominal wall hernia, hx of abdominal hysterectomy, appendectomy, spine surgery (years ago).   PAIN:  Are you having pain? Yes: NPRS scale: 6/10 Pain location: L shoulder/ L hip  Pain description: dull ache Aggravating factors: sitting Relieving factors: laying down  PRECAUTIONS: None  WEIGHT BEARING RESTRICTIONS: No  FALLS:  Has patient fallen in last 6  months? Yes. Number of falls 2  LIVING ENVIRONMENT: Lives with: lives with their son Lives in: House/apartment Stairs: Yes: Internal: 13 steps; doesn't go up to the second floor and External: 4 steps;  can reach both Has following equipment at home: Single point cane and Walker - 2 wheeled  OCCUPATION: Retired  PLOF: Independent with basic ADLs  PATIENT GOALS: "To make my neck quit hurting and to prevent it from getting worse"  NEXT MD VISIT: Unsure, but maybe May  OBJECTIVE:   DIAGNOSTIC FINDINGS:    CT HEAD WITHOUT CONTRAST  CT CERVICAL SPINE WITHOUT CONTRAST IMPRESSION: 1. No acute intracranial pathology. 2. Expected interval evolution of encephalomalacia of the right frontal lobe, following intraparenchymal hemorrhage seen acutely on examination dated 08/03/2021. 3. No fracture or static subluxation of the cervical spine. 4. Generally mild disc space height loss and osteophytosis throughout the cervical spine, with partial ankylosis of C5-C6 and a large left eccentric posterior bridging osteophyte at this level. MRI may be used to further evaluate cervical disc and neural foraminal pathology if indicated by neurologically localizing signs and symptoms.  EXAM: xRay PELVIS - 1-2 VIEW  IMPRESSION: 1. No convincing evidence of acute fracture or diastasis of the pelvis. 2. Well corticated osseous focus beneath the right inferior pubic ramus may reflect sequela of avulsion injury or chronic tendinopathy, suggest correlation with direct tenderness to palpation.   PATIENT SURVEYS:  NDI 15/50 = 30% disability FOTO 65/68   10/23/2022 : 52/68  10/23/2022 Foto CVA 57/59  COGNITION: Overall cognitive status: History of cognitive impairments - at baseline  SENSATION: WFL  POSTURE: rounded shoulders and forward head  PALPATION:     CERVICAL ROM:   Active ROM A/PROM (deg) eval 3/21 4/4  Flexion 39 25 39  Extension 10 24 25   Right lateral flexion 7 15 15   Left  lateral flexion 6 20 14   Right rotation 49 53 41  Left rotation 30 42 35   (Blank rows = not tested)  UPPER EXTREMITY ROM:  Active ROM Right eval Left eval Right 4/4 Left 4/4  Shoulder flexion 128 81 149 22  Shoulder abduction 160 68  158  38   (Blank rows = not tested)   UPPER EXTREMITY MMT:  Grossly decreased (3+/5) in the L UE due to pain and lack of ROM   CERVICAL SPECIAL TESTS:  Spurling's test: Negative and Sharp pursor's test: Negative      LOWER EXTREMITY MMT:    MMT Right 3/21 Left 3/21  Hip flexion 4+ 4-  Hip extension 4+ 4-  Hip abduction 4+ 4  Hip adduction 4+ 4  Hip internal rotation    Hip external rotation    Knee flexion 4+ 4-  Knee extension 4+ 4  Ankle dorsiflexion 4+ 4  Ankle plantarflexion    Ankle inversion    Ankle eversion     (Blank rows = not tested)  LOWER EXTREMITY ROM:     Passive  Right 3/21 Left 3/21  Hip flexion 120 105  Hip extension    Hip abduction    Hip adduction    Hip internal rotation    Hip external rotation    Knee flexion [facility] WFL  Knee extension (in Hip flexion 90deg) 143 120  Ankle dorsiflexion    Ankle plantarflexion    Ankle inversion    Ankle eversion     (Blank rows = not tested)    LOWER EXTREMITY SPECIAL TESTS:  Hip special tests: Luisa Hart (FABER) test: positive , Hip scouring test: negative, and distraction: negative    FUNCTIONAL TESTS:  Not performed at this time.  TODAY'S TREATMENT: DATE: 10/28/22    Sit<>supine without assist from PT, but increased  time due to L shoulder ROM deficits.   Supine SAQ 4# ankle weight x 10 BLE Reciprocal march 4# ankle weights x 10 BLE  Clam shell RTB x 10 BLE  Attempted bridge but pain SI joint limits full ROM on this day. LTR x 8 Bil.  Cervical rotation x 10 bil with hold at pain free range    Supine manual therapy for BLE HS and glutes 2 x 1 min each.  Upper trap STM with trigger point release x 5 min   Cervical rotation and lateral flexion to  the R with trigger point release x 1 min each.     PATIENT EDUCATION:  Education details: Pt educated on role of PT and services provided during current POC, along with prognosis and information about the clinic. Need for Modified PT referral to address new L hip and low back pain. Person educated: Patient and   Education method: Explanation Education comprehension: verbalized understanding  HOME EXERCISE PROGRAM:  Access Code: [email or web address] URL: [email or web address] Date: 09/18/2022 Prepared by: Tomasa Hose  Exercises - Seated Upper Trapezius Stretch  - 1 x daily - 7 x weekly - 3 sets - 10 reps - Seated Levator Scapulae Stretch  - 1 x daily - 7 x weekly - 3 sets - 10 reps - Seated Scapular Retraction  - 1 x daily - 7 x weekly - 3 sets - 10 reps  ASSESSMENT:  CLINICAL IMPRESSION: Pt tolerates all interventions well without increased pain. Pt continues to report reduced pain and improved movement in the cspine as well as in the L hip and low back. With bridges, pt notes mild pain in the L SI region on this day, but resolved with rest.  Pt would benefit from skilled PT to continue to improve cervical ROM as well as address impairments of the LLE for main management and improved strength and function to improve QoL and reduce dependence on husband as caregiver.        OBJECTIVE IMPAIRMENTS: decreased activity tolerance, decreased cognition, decreased ROM, decreased strength, decreased safety awareness, hypomobility, and pain.   ACTIVITY LIMITATIONS: carrying, lifting, bending, sitting, standing, squatting, and reach over head  PARTICIPATION LIMITATIONS: meal prep, cleaning, laundry, driving, shopping, community activity, and yard work  PERSONAL FACTORS: Age, Behavior pattern, Past/current experiences, Time since onset of injury/illness/exacerbation, and 3+ comorbidities: arthritis, DM2, HTN, Obesity,   are also affecting patient's functional outcome.   REHAB  POTENTIAL: Good  CLINICAL DECISION MAKING: Stable/uncomplicated  EVALUATION COMPLEXITY: Low   GOALS: Goals reviewed with patient? Yes  SHORT TERM GOALS: Target date: 10/14/2022  Pt will be independent with HEP in order to improve strength of UE's and ROM of the neck in order to return to PLOF. Baseline: provided on 09/18/22 Goal status: MET    LONG TERM GOALS: Target date: 12/09/2022  Patient will demonstrate improved function as evidenced by a score of 68 on FOTO measure for full participation in activities at home and in the community. Baseline: 65. 10/23/22: 52 Goal status: IN PROGRESS  2.  Pt to improve NDI by 5 points or 10% in order to demonstrate a reduction in overall disability caused by neck pain at this time. Baseline: 15/50 or 30% disability Goal status: INITIAL  3.  Pt to demonstrate improved cervical rotation to 60 deg bilaterally in order to perform tasks around the house that require turning of the head. Baseline: R/L Cervical Rotation: 49/30. 4/424 R:53. L:42 Goal status: IN PROGRESS  4.  Pt to report 0/10  pain in the neck following light household chores at home in order to demonstrate an overall reduction in pain and an improvement in QoL. Baseline: 4/10 at rest 0/10 in neck Goal status: MET   PLAN:  PT FREQUENCY: 1-2x/week  PT DURATION: 12 weeks  PLANNED INTERVENTIONS: Therapeutic exercises, Therapeutic activity, Neuromuscular re-education, Balance training, Gait training, Patient/Family education, Self Care, Joint mobilization, Joint manipulation, Vestibular training, Canalith repositioning, Visual/preceptual remediation/compensation, DME instructions, Dry Needling, Cognitive remediation, Spinal manipulation, Spinal mobilization, Cryotherapy, Moist heat, Traction, Manual therapy, and Re-evaluation    PLAN FOR NEXT SESSION:    Continue to address cervical ROM and strength deficits.  Continue to address L hip and low back pain.   Grier Rocher PT,  DPT  Physical Therapist - [facility]  5:03 PM 10/28/22

## 2022-10-28 NOTE — Therapy (Signed)
Occupational Therapy Neuro Treatment Note   Patient Name: Alyssa Mayo MRN: [id number] DOB:07/03/1946, 77 y.o., female Today's Date: 03/14/2022   PCP: Alyssa MinaJames Hedrick, MD REFERRING PROVIDER: Ihor AustinJessica McCue, NP     OT End of Session - 10/28/22 1209     Visit Number 11    Number of Visits 24    Date for OT Re-Evaluation 12/09/22    Authorization Type Progress reporting period starting 09/16/2022    OT Start Time 1020    OT Stop Time 1100    OT Time Calculation (min) 40 min    Activity Tolerance Patient tolerated treatment well    [facility] tasks assessed/performed                                    Past Medical History:  Diagnosis Date   Abnormal levels of other serum enzymes 07/17/2015   Arthritis     Constipation 07/11/2015   COVID-19 03/06/2021   Diabetes mellitus without complication (HCC)     Elevated liver enzymes     Fatty liver     Generalized abdominal pain 07/11/2015   Hyperlipidemia     Hypertension     Lifelong obesity     Macular degeneration     Morbid obesity due to excess calories (HCC) 07/11/2015   Other fatigue 07/11/2015   Sleep apnea     Spinal headache           Past Surgical History:  Procedure Laterality Date   ABDOMINAL HYSTERECTOMY       APPENDECTOMY       CATARACT EXTRACTION       CERVICAL LAMINECTOMY   07/21/1985   CESAREAN SECTION       COLONOSCOPY   07/21/2012    diverticulosis, [facility] Dr. Ricki Mayo    COLONOSCOPY WITH PROPOFOL N/A 02/04/2019    Procedure: COLONOSCOPY WITH PROPOFOL;  Surgeon: Alyssa Mayo, Alyssa U, MD;  [facility];  Service: Endoscopy;  Laterality: N/A;   COLONOSCOPY WITH PROPOFOL N/A 03/18/2021    Procedure: COLONOSCOPY WITH PROPOFOL;  Surgeon: Alyssa Mayo, Alyssa T, MD;  Location: [facility] ENDOSCOPY;  Service: Endoscopy;  Laterality: N/A;  DM   DIAGNOSTIC LAPAROSCOPY       ESOPHAGOGASTRODUODENOSCOPY (EGD) WITH PROPOFOL N/A 02/04/2019    Procedure: ESOPHAGOGASTRODUODENOSCOPY (EGD)  WITH PROPOFOL;  Surgeon: Alyssa Mayo, Alyssa U, MD;  [facility];  Service: Endoscopy;  Laterality: N/A;   ESOPHAGOGASTRODUODENOSCOPY (EGD) WITH PROPOFOL N/A 03/18/2021    Procedure: ESOPHAGOGASTRODUODENOSCOPY (EGD) WITH PROPOFOL;  Surgeon: Alyssa Mayo, Alyssa T, MD;  Location: [facility] ENDOSCOPY;  Service: Endoscopy;  Laterality: N/A;   LOOP RECORDER INSERTION N/A 08/05/2021    Procedure: LOOP RECORDER INSERTION;  Surgeon: Alyssa Mayo, Alyssa W, MD;  Location: [facility];  Service: Cardiovascular;  Laterality: N/A;   SPINE SURGERY       TUBAL LIGATION            Patient Active Problem List    Diagnosis Date Noted   Paroxysmal atrial fibrillation (HCC) 08/08/2022   Hypercoagulable state due to paroxysmal atrial fibrillation (HCC) 08/08/2022   Cerebral edema (HCC) 08/07/2021   OSA (obstructive sleep apnea) 08/07/2021   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   CVA (cerebral vascular accident) (HCC) 08/01/2021   GERD (gastroesophageal reflux disease) 07/20/2019   Advanced care planning/counseling discussion 12/17/2016   Skin lesions, generalized 11/13/2016   Chronic right hip pain 06/17/2016  Vitamin D deficiency 09/26/2015   Diastasis recti 08/08/2015   Elevated serum GGT level 07/24/2015   Essential hypertension 07/17/2015   Fatty liver 07/17/2015   Elevated alkaline phosphatase level 07/17/2015   Elevated serum glutamic pyruvic transaminase (SGPT) level 07/17/2015   Abnormal finding on EKG 07/11/2015   Morbid obesity (HCC) 07/11/2015   Colon, diverticulosis 07/11/2015   Abdominal wall hernia 07/11/2015   DM (diabetes mellitus), type 2 with neurological complications (HCC)     Hyperlipidemia        ONSET DATE: 08/01/2021   REFERRING DIAG: CVA   THERAPY DIAG:  Muscle weakness (generalized)   Rationale for Evaluation and Treatment Rehabilitation   SUBJECTIVE:    SUBJECTIVE STATEMENT:    Pt.  Reports doing okay today Pt accompanied by: self    PERTINENT HISTORY:  Pt. is a 77 y.o. female who had a unexpectedly hospitalized from 2/12-2/14/2024 for COVID-19, and a UTI.  Pt. 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 Pt. attended inpatient rehab from 08/07/2021-08/22/2021. Pt. received Home health therapy services this past spring.  Pt. has recently had an assessment through driver rehabilitation services at [facility] with recommendations for referrals for  outpatient OT/PT, and ST services. Pt. PMHx includes: HTN, Hyperlipidemia, Macular degeneration, DM, and Obesity.   PRECAUTIONS: None   WEIGHT BEARING RESTRICTIONS No   PAIN:  Are you having pain? 1-2/10 left shoulder  pain    FALLS: Has patient fallen in last 6 months? Yes.   LIVING ENVIRONMENT: Lives with: lives with their family  Son  Alyssa Mayo Lives in: House/apartment Main living are on one floor Stairs:  2 steps to enter Has following equipment at home: Single point cane, Wheelchair (manual), shower chair, Grab bars, bed rail, and rubber mat.   PLOF: Independent   PATIENT GOALS: To be able to Drive, and cook again   OBJECTIVE:    HAND DOMINANCE: Right    ADLs:  Transfers/ambulation related to ADLs: Independent Eating: Independent Grooming: [employer] Pt. reports being unable to use her left hand to perform haircare.  UB Dressing: Independent pullover shirt, independent now with fastening a bra LB Dressing: Independent donning pants socks, and slide on shoes Toileting: Independent Bathing: Independent Tub Shower transfers: Walk-in shower Independent now     IADLs: Shopping: Needs to be accompanied to the grocery store. Light housekeeping: Do  Meal Prep:  Is able to perform light meal prep Community mobility: Relies on son, and friend Medication management: Son sets up Mining engineer management:TBD Handwriting: TBD   MOBILITY STATUS: Hx of falls out of bed     ACTIVITY TOLERANCE: Activity tolerance:  10-20  min. Before rest break   FUNCTIONAL OUTCOME MEASURES: FOTO: 61  TR score: 62  10/09/2022: FOTO: 49   UPPER EXTREMITY ROM      Active ROM Right eval Left eval Left  10/09/2022 Left 10/23/22  Shoulder flexion WFL 54(74) scaption 0(55) 26(80)  Shoulder abduction WFL 58(75) 32(54) 32(50)  Shoulder adduction        Shoulder extension        Shoulder internal rotation        Shoulder external rotation        Elbow flexion [facility] WFL 145 145  Elbow extension [facility] WFL 10(0) 10(0)  Wrist flexion WFL TBD [facility] WFL  Wrist extension WFL TBD [facility] WFL  Wrist ulnar deviation        Wrist radial deviation        Wrist pronation  Wrist supination        (Blank rows = not tested)     UPPER EXTREMITY MMT:      MMT Right eval Left eval Left 10/09/2022 Left 10/23/2022  Shoulder flexion 4+/5 2+/5 1/5 2-/5  Shoulder abduction 4+/5 2+/5 2-/5 2-/5  Shoulder adduction        Shoulder extension        Shoulder internal rotation        Shoulder external rotation        Middle trapezius        Lower trapezius        Elbow flexion 4+/5 3+/5 3/5 3/5  Elbow extension 4+/5 3+/5 3/5 3/5  Wrist flexion 4+/5 TBD 3/5 3/5  Wrist extension 4+/5 TBD 3/5 3/5  Wrist ulnar deviation        Wrist radial deviation        Wrist pronation        Wrist supination        (Blank rows = not tested)   HAND FUNCTION: Grip strength: Right: 33 lbs; Left: 11 lbs, Lateral pinch: Right: 17 lbs, Left: 2 lbs, and 3 point pinch: Right: 17 lbs, Left: 2 lbs  10/09/2022: Grip strength: Right: 33 lbs; Left: 10 lbs, Lateral pinch: Right: 17 lbs, Left: 1 lbs, and 3 point pinch: Right: 17 lbs, Left: 1 lbs   10/23/2022: Grip strength: Right: 33 lbs; Left: 16 lbs, Lateral pinch: Right: 17 lbs, Left: 1 lbs, and 3 point pinch: Right: 17 lbs, Left: 1 lbs   COORDINATION:   9 Hole Peg test: Right: 27 sec.  sec; Left: 52 sec.  10/09/2022: 9 Hole Peg test: Right: 27 sec.  sec; Left: 1 min. & 35 sec.   SENSATION: Light touch:  WFL Proprioception: WFL   COGNITION: Overall cognitive status: Within functional limits for tasks assessed   VISION: Subjective report: Glasses. Just received a new prescription to increase bifocal strength Baseline vision: Macular Degeneration Visual history: macular degeneration   VISION ASSESSMENT: To be further assessed in functional context  Left sided Awareness      PERCEPTION: Limited left sided awareness     TODAY'S TREATMENT:    Therapeutic Ex:  Pt. worked on [employer], and reciprocal motion using the UBE while seated for 8 min. with no resistance. Pt. Tolerated AAROM for left shoulder flexion at the tabletop. Pt. Tolerated PROM for the shoulder.  Neuromuscular re-education:  Pt. worked on [facility] skills grasping 1" sticks. Pt. worked on storing the objects in the palm of her hand, and translatory skills moving the items from the palm of the hand to the tip of the 2nd digit, and thumb. Pt. worked on removing the pegs using bilateral alternating hand patterns.        PATIENT EDUCATION: Education details: Compensatory strategies during typing. Person educated: Patient Education method: verbal cues Education comprehension: verbalized understanding and pt does not plan to cook unless son is present.     HOME EXERCISE PROGRAM:  Reviewed home activities to enhance left hand digit extension  during typing skills.   GOALS: Goals reviewed with patient? Yes     SHORT TERM GOALS: Target date: 10/28/2022     Pt. Will improve FOTO score by 2 points to reflect improved pt. perceived functional performance Baseline: 10/23/2022: FOTO 49 10/09/2022: FOTO 49, FOTO: 61 TR score: 62  Goal status: INITIAL   LONG TERM GOALS: Target date: 12/09/2022    Pt. Will improve left shoulder ROM by 10  degrees to be able able to efficiently apply deodorant Baseline: 10/23/2022: Shoulder flexion: 26(80), Abduction: 32(50) 10/09/2022: flexion: 0(55), Abduction: 32(54) Goal status:  Improving, but very limited.   2.  Pt. will be able to independently hold and use a blow dryer, and brush for hair care. Baseline: 10/23/2022: Pt. Continues to be unable to hold a blow dryer. Eval: Pt. Is unable to sustain her BUEs in elevation, and use a blow dryer, and brush. 10/09/2022: Pt. Is unable to hold a blow dryer. Eval: Pt. Is unable to sustain her BUEs in elevation, and use a blow dryer, and brush. Goal status: Ongoing   3.  Pt. Will improve left lateral pinch strength by 3# to be able to independently cut meat. Baseline: 10/23/2022: Pt. Continues to present with difficulty cutting meat. 10/09/2022: 1#  Eval: 2#. Pt. has difficulty stabilizing utensil, and food while cutting food. Goal status: Ongoing   4.  Pt. Will improve Left hand [facility] skills to be able to be able to independently, and efficiently manipulate small objects during ADL tasks. Baseline 10/23/2022: Pt. Continues to present with difficulty manipulating small objects. 10/09/2022: Left: 1 min & 35 sec.: Eval: Left: 52 sec. Right: 27 sec. Goal status: Ongoing     6.: Pt. will improve with left grip strength to be able to hold and pull up covers. Baseline: 10/23/2022: Left: 16# 10/09/2022: Left: 10# Eval:  Left grip strength 11#. Pt. has difficulty holding a full drink  securely with the left hand.             Goal status: Ongoing   7. Pt. Will improve typing speed, and accuracy in preparation for efficiently typing simple email message. Baseline: 10/23/2022: Pt. continues to present with limited speed, and accuracy with typing. 10/09/2022: Pt. presents with difficulty typing. Pt. presents with difficulty typing an email. Typing speed, and accuracy TBD             Goal status: INITIAL                                        CLINICAL IMPRESSION:  A telephone call was made to Dr. Webb Silversmith office, and a message was left regarding Pt.'s Left shoulder changes in preparation for her upcoming appointment.  Pt. Was able to tolerate  treatment session well today. Pt. was able to perform translatory movements with the left hand, however at time drops the 1" sticks form the ulnar aspect of her left hand, or drops them when attempting to move them from her palm to the tip of her 2nd digit, and thumb.  Pt. continues to benefit from OT services to  work towards improving L functioning in order to maximize engagement, and independence with these tasks.    PERFORMANCE DEFICITS in functional skills including ADLs, IADLs, coordination, dexterity, sensation, ROM, strength, FMC, decreased knowledge of use of DME, vision, and UE functional use, cognitive skills including attention, and psychosocial skills including coping strategies, environmental adaptation, habits, and routines and behaviors.    IMPAIRMENTS are limiting patient from ADLs, IADLs, and social participation.    COMORBIDITIES may have co-morbidities  that affects occupational performance. Patient will benefit from skilled OT to address above impairments and improve overall function.   MODIFICATION OR ASSISTANCE TO COMPLETE EVALUATION: Min-Moderate modification of tasks or assist with assess necessary to complete an evaluation.   OT OCCUPATIONAL PROFILE AND HISTORY: Detailed assessment: Review  of records and additional review of physical, cognitive, psychosocial history related to current functional performance.   CLINICAL DECISION MAKING: Moderate - several treatment options, min-mod task modification necessary   REHAB POTENTIAL: Good   EVALUATION COMPLEXITY: Moderate      PLAN: OT FREQUENCY: 2x/week   OT DURATION: 12 weeks   PLANNED INTERVENTIONS: self care/ADL training, therapeutic exercise, therapeutic activity, neuromuscular re-education, manual therapy, passive range of motion, and paraffin   RECOMMENDED OTHER SERVICES: ST, and PT   CONSULTED AND AGREED WITH PLAN OF CARE: Patient   PLAN FOR NEXT SESSION:  L hand strengthening and coordination exercises    Olegario Messier, MS, OTR/L

## 2022-10-30 ENCOUNTER — Ambulatory Visit: Payer: Medicare HMO | Admitting: Physical Therapy

## 2022-10-30 ENCOUNTER — Ambulatory Visit: Payer: Medicare HMO | Admitting: Occupational Therapy

## 2022-10-30 ENCOUNTER — Encounter: Payer: Medicare HMO | Admitting: Speech Pathology

## 2022-10-30 DIAGNOSIS — R262 Difficulty in walking, not elsewhere classified: Secondary | ICD-10-CM

## 2022-10-30 DIAGNOSIS — M6281 Muscle weakness (generalized): Secondary | ICD-10-CM | POA: Diagnosis not present

## 2022-10-30 DIAGNOSIS — R2681 Unsteadiness on feet: Secondary | ICD-10-CM

## 2022-10-30 DIAGNOSIS — R278 Other lack of coordination: Secondary | ICD-10-CM

## 2022-10-30 DIAGNOSIS — M542 Cervicalgia: Secondary | ICD-10-CM

## 2022-10-30 DIAGNOSIS — M25552 Pain in left hip: Secondary | ICD-10-CM

## 2022-10-30 NOTE — Therapy (Signed)
Occupational Therapy Neuro Treatment Note   Patient Name: Alyssa Mayo MRN: [id number] DOB:06/07/1946, 77 y.o., female Today's Date: 03/14/2022   PCP: Jerl MinaJames Hedrick, MD REFERRING PROVIDER: Ihor AustinJessica McCue, NP     OT End of Session - 10/30/22 1207     Visit Number 12    Date for OT Re-Evaluation 12/09/22    OT Start Time 1015    OT Stop Time 1100    OT Time Calculation (min) 45 min    Activity Tolerance Patient tolerated treatment well    Behavior [facility] for tasks assessed/performed                                    Past Medical History:  Diagnosis Date   Abnormal levels of other serum enzymes 07/17/2015   Arthritis     Constipation 07/11/2015   COVID-19 03/06/2021   Diabetes mellitus without complication (HCC)     Elevated liver enzymes     Fatty liver     Generalized abdominal pain 07/11/2015   Hyperlipidemia     Hypertension     Lifelong obesity     Macular degeneration     Morbid obesity due to excess calories (HCC) 07/11/2015   Other fatigue 07/11/2015   Sleep apnea     Spinal headache           Past Surgical History:  Procedure Laterality Date   ABDOMINAL HYSTERECTOMY       APPENDECTOMY       CATARACT EXTRACTION       CERVICAL LAMINECTOMY   07/21/1985   CESAREAN SECTION       COLONOSCOPY   07/21/2012    diverticulosis, [facility] Dr. Ricki RodriguezSkulski    COLONOSCOPY WITH PROPOFOL N/A 02/04/2019    Procedure: COLONOSCOPY WITH PROPOFOL;  Surgeon: Christena DeemSkulskie, Martin U, MD;  [facility];  Service: Endoscopy;  Laterality: N/A;   COLONOSCOPY WITH PROPOFOL N/A 03/18/2021    Procedure: COLONOSCOPY WITH PROPOFOL;  Surgeon: Regis BillLocklear, Cameron T, MD;  Location: [facility] ENDOSCOPY;  Service: Endoscopy;  Laterality: N/A;  DM   DIAGNOSTIC LAPAROSCOPY       ESOPHAGOGASTRODUODENOSCOPY (EGD) WITH PROPOFOL N/A 02/04/2019    Procedure: ESOPHAGOGASTRODUODENOSCOPY (EGD) WITH PROPOFOL;  Surgeon: Christena DeemSkulskie, Martin U, MD;  [facility];  Service:  Endoscopy;  Laterality: N/A;   ESOPHAGOGASTRODUODENOSCOPY (EGD) WITH PROPOFOL N/A 03/18/2021    Procedure: ESOPHAGOGASTRODUODENOSCOPY (EGD) WITH PROPOFOL;  Surgeon: Regis BillLocklear, Cameron T, MD;  Location: [facility] ENDOSCOPY;  Service: Endoscopy;  Laterality: N/A;   LOOP RECORDER INSERTION N/A 08/05/2021    Procedure: LOOP RECORDER INSERTION;  Surgeon: Marinus Mawaylor, Gregg W, MD;  Location: [facility];  Service: Cardiovascular;  Laterality: N/A;   SPINE SURGERY       TUBAL LIGATION            Patient Active Problem List    Diagnosis Date Noted   Paroxysmal atrial fibrillation (HCC) 08/08/2022   Hypercoagulable state due to paroxysmal atrial fibrillation (HCC) 08/08/2022   Cerebral edema (HCC) 08/07/2021   OSA (obstructive sleep apnea) 08/07/2021   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   CVA (cerebral vascular accident) (HCC) 08/01/2021   GERD (gastroesophageal reflux disease) 07/20/2019   Advanced care planning/counseling discussion 12/17/2016   Skin lesions, generalized 11/13/2016   Chronic right hip pain 06/17/2016   Vitamin D deficiency 09/26/2015   Diastasis recti 08/08/2015   Elevated serum GGT level 07/24/2015  Essential hypertension 07/17/2015   Fatty liver 07/17/2015   Elevated alkaline phosphatase level 07/17/2015   Elevated serum glutamic pyruvic transaminase (SGPT) level 07/17/2015   Abnormal finding on EKG 07/11/2015   Morbid obesity (HCC) 07/11/2015   Colon, diverticulosis 07/11/2015   Abdominal wall hernia 07/11/2015   DM (diabetes mellitus), type 2 with neurological complications (HCC)     Hyperlipidemia        ONSET DATE: 08/01/2021   REFERRING DIAG: CVA   THERAPY DIAG:  Muscle weakness (generalized)   Rationale for Evaluation and Treatment Rehabilitation   SUBJECTIVE:    SUBJECTIVE STATEMENT:    Pt. reports being tired today 2/2 not sleeping well last night. Pt accompanied by: self   PERTINENT HISTORY:  Pt. is a 77 y.o. female who had a  unexpectedly hospitalized from 2/12-2/14/2024 for COVID-19, and a UTI.  Pt. 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 Pt. attended inpatient rehab from 08/07/2021-08/22/2021. Pt. received Home health therapy services this past spring.  Pt. has recently had an assessment through driver rehabilitation services at [facility] with recommendations for referrals for  outpatient OT/PT, and ST services. Pt. PMHx includes: HTN, Hyperlipidemia, Macular degeneration, DM, and Obesity.   PRECAUTIONS: None   WEIGHT BEARING RESTRICTIONS No   PAIN:  Are you having pain? .5(1/2)/10 left shoulder  pain    FALLS: Has patient fallen in last 6 months? Yes.   LIVING ENVIRONMENT: Lives with: lives with their family  Son  Alyssa Mayo Lives in: House/apartment Main living are on one floor Stairs:  2 steps to enter Has following equipment at home: Single point cane, Wheelchair (manual), shower chair, Grab bars, bed rail, and rubber mat.   PLOF: Independent   PATIENT GOALS: To be able to Drive, and cook again   OBJECTIVE:    HAND DOMINANCE: Right    ADLs:  Transfers/ambulation related to ADLs: Independent Eating: Independent Grooming: [employer] Pt. reports being unable to use her left hand to perform haircare.  UB Dressing: Independent pullover shirt, independent now with fastening a bra LB Dressing: Independent donning pants socks, and slide on shoes Toileting: Independent Bathing: Independent Tub Shower transfers: Walk-in shower Independent now     IADLs: Shopping: Needs to be accompanied to the grocery store. Light housekeeping: Do  Meal Prep:  Is able to perform light meal prep Community mobility: Relies on son, and friend Medication management: Son sets up Mining engineer management:TBD Handwriting: TBD   MOBILITY STATUS: Hx of falls out of bed     ACTIVITY TOLERANCE: Activity tolerance:  10-20 min. Before rest break   FUNCTIONAL OUTCOME  MEASURES: FOTO: 61  TR score: 62  10/09/2022: FOTO: 49   UPPER EXTREMITY ROM      Active ROM Right eval Left eval Left  10/09/2022 Left 10/23/22  Shoulder flexion WFL 54(74) scaption 0(55) 26(80)  Shoulder abduction WFL 58(75) 32(54) 32(50)  Shoulder adduction        Shoulder extension        Shoulder internal rotation        Shoulder external rotation        Elbow flexion [facility] WFL 145 145  Elbow extension [facility] WFL 10(0) 10(0)  Wrist flexion WFL TBD [facility] WFL  Wrist extension WFL TBD [facility] WFL  Wrist ulnar deviation        Wrist radial deviation        Wrist pronation        Wrist supination        (  Blank rows = not tested)     UPPER EXTREMITY MMT:      MMT Right eval Left eval Left 10/09/2022 Left 10/23/2022  Shoulder flexion 4+/5 2+/5 1/5 2-/5  Shoulder abduction 4+/5 2+/5 2-/5 2-/5  Shoulder adduction        Shoulder extension        Shoulder internal rotation        Shoulder external rotation        Middle trapezius        Lower trapezius        Elbow flexion 4+/5 3+/5 3/5 3/5  Elbow extension 4+/5 3+/5 3/5 3/5  Wrist flexion 4+/5 TBD 3/5 3/5  Wrist extension 4+/5 TBD 3/5 3/5  Wrist ulnar deviation        Wrist radial deviation        Wrist pronation        Wrist supination        (Blank rows = not tested)   HAND FUNCTION: Grip strength: Right: 33 lbs; Left: 11 lbs, Lateral pinch: Right: 17 lbs, Left: 2 lbs, and 3 point pinch: Right: 17 lbs, Left: 2 lbs  10/09/2022: Grip strength: Right: 33 lbs; Left: 10 lbs, Lateral pinch: Right: 17 lbs, Left: 1 lbs, and 3 point pinch: Right: 17 lbs, Left: 1 lbs   10/23/2022: Grip strength: Right: 33 lbs; Left: 16 lbs, Lateral pinch: Right: 17 lbs, Left: 1 lbs, and 3 point pinch: Right: 17 lbs, Left: 1 lbs   COORDINATION:   9 Hole Peg test: Right: 27 sec.  sec; Left: 52 sec.  10/09/2022: 9 Hole Peg test: Right: 27 sec.  sec; Left: 1 min. & 35 sec.   SENSATION: Light touch: WFL Proprioception: WFL   COGNITION: Overall  cognitive status: Within functional limits for tasks assessed   VISION: Subjective report: Glasses. Just received a new prescription to increase bifocal strength Baseline vision: Macular Degeneration Visual history: macular degeneration   VISION ASSESSMENT: To be further assessed in functional context  Left sided Awareness      PERCEPTION: Limited left sided awareness     TODAY'S TREATMENT:    Therapeutic Ex:  Pt. worked on [employer], and reciprocal motion using the UBE while seated for 8 min. with no resistance. Pt. Tolerated AAROM for left shoulder flexion at the tabletop. Pt. tolerated AAROM at the tabletop surface.  Neuromuscular re-education:  Pt. worked on [facility] skills grasping 1" sticks on the [employer]. Pt. worked on storing the objects in the palm of her hand, and translatory skills moving the items from the palm of the hand to the tip of the 2nd digit, and thumb. Pt. worked on removing the pegs  while storing them in the palm of her hand.       PATIENT EDUCATION: Education details: Compensatory strategies during typing. Person educated: Patient Education method: verbal cues Education comprehension: verbalized understanding and pt does not plan to cook unless son is present.     HOME EXERCISE PROGRAM:  Reviewed home activities to enhance left hand digit extension  during typing skills.   GOALS: Goals reviewed with patient? Yes     SHORT TERM GOALS: Target date: 10/28/2022     Pt. Will improve FOTO score by 2 points to reflect improved pt. perceived functional performance Baseline: 10/23/2022: FOTO 49 10/09/2022: FOTO 49, FOTO: 61 TR score: 62  Goal status: INITIAL   LONG TERM GOALS: Target date: 12/09/2022    Pt. Will improve left shoulder ROM by 10  degrees to be able able to efficiently apply deodorant Baseline: 10/23/2022: Shoulder flexion: 26(80), Abduction: 32(50) 10/09/2022: flexion: 0(55), Abduction: 32(54) Goal status: Improving, but very  limited.   2.  Pt. will be able to independently hold and use a blow dryer, and brush for hair care. Baseline: 10/23/2022: Pt. Continues to be unable to hold a blow dryer. Eval: Pt. Is unable to sustain her BUEs in elevation, and use a blow dryer, and brush. 10/09/2022: Pt. Is unable to hold a blow dryer. Eval: Pt. Is unable to sustain her BUEs in elevation, and use a blow dryer, and brush. Goal status: Ongoing   3.  Pt. Will improve left lateral pinch strength by 3# to be able to independently cut meat. Baseline: 10/23/2022: Pt. Continues to present with difficulty cutting meat. 10/09/2022: 1#  Eval: 2#. Pt. has difficulty stabilizing utensil, and food while cutting food. Goal status: Ongoing   4.  Pt. Will improve Left hand [facility] skills to be able to be able to independently, and efficiently manipulate small objects during ADL tasks. Baseline 10/23/2022: Pt. Continues to present with difficulty manipulating small objects. 10/09/2022: Left: 1 min & 35 sec.: Eval: Left: 52 sec. Right: 27 sec. Goal status: Ongoing     6.: Pt. will improve with left grip strength to be able to hold and pull up covers. Baseline: 10/23/2022: Left: 16# 10/09/2022: Left: 10# Eval:  Left grip strength 11#. Pt. has difficulty holding a full drink  securely with the left hand.             Goal status: Ongoing   7. Pt. Will improve typing speed, and accuracy in preparation for efficiently typing simple email message. Baseline: 10/23/2022: Pt. continues to present with limited speed, and accuracy with typing. 10/09/2022: Pt. presents with difficulty typing. Pt. presents with difficulty typing an email. Typing speed, and accuracy TBD             Goal status: INITIAL                                        CLINICAL IMPRESSION:  Pt. 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  Pt. required the pegboard to be arranged sideways 2/2 left shoulder limitations. Pt. was able to perform translatory movements with the left  hand, however at time dropped multiple 1" sticks from her finger tips today. Pt. continues to benefit from OT services to work towards improving L functioning in order to maximize engagement, and independence with these tasks.    PERFORMANCE DEFICITS in functional skills including ADLs, IADLs, coordination, dexterity, sensation, ROM, strength, FMC, decreased knowledge of use of DME, vision, and UE functional use, cognitive skills including attention, and psychosocial skills including coping strategies, environmental adaptation, habits, and routines and behaviors.    IMPAIRMENTS are limiting patient from ADLs, IADLs, and social participation.    COMORBIDITIES may have co-morbidities  that affects occupational performance. Patient will benefit from skilled OT to address above impairments and improve overall function.   MODIFICATION OR ASSISTANCE TO COMPLETE EVALUATION: Min-Moderate modification of tasks or assist with assess necessary to complete an evaluation.   OT OCCUPATIONAL PROFILE AND HISTORY: Detailed assessment: Review of records and additional review of physical, cognitive, psychosocial history related to current functional performance.   CLINICAL DECISION MAKING: Moderate - several treatment options, min-mod task modification necessary   REHAB POTENTIAL: Good   EVALUATION  COMPLEXITY: Moderate      PLAN: OT FREQUENCY: 2x/week   OT DURATION: 12 weeks   PLANNED INTERVENTIONS: self care/ADL training, therapeutic exercise, therapeutic activity, neuromuscular re-education, manual therapy, passive range of motion, and paraffin   RECOMMENDED OTHER SERVICES: ST, and PT   CONSULTED AND AGREED WITH PLAN OF CARE: Patient   PLAN FOR NEXT SESSION:  L hand strengthening and coordination exercises   Harrel Carina, MS, OTR/L

## 2022-10-30 NOTE — Therapy (Signed)
OUTPATIENT PHYSICAL THERAPY  TREATMENT   Patient Name: Alyssa Mayo MRN: [id number] DOB:1945/12/16, 77 y.o., female Today's Date: 10/30/2022  END OF SESSION:   PT End of Session - 10/30/22 1216     Visit Number 12    Number of Visits 36    Date for PT Re-Evaluation 01/22/23    Authorization Type [employer]    Progress Note Due on Visit 20    PT Start Time 1105    PT Stop Time 1148    PT Time Calculation (min) 43 min    Activity Tolerance Patient tolerated treatment well;No increased pain    Behavior During Therapy [facility] for tasks assessed/performed                  Past Medical History:  Diagnosis Date   Abnormal levels of other serum enzymes 07/17/2015   Arthritis    Constipation 07/11/2015   COVID-19 03/06/2021   Diabetes mellitus without complication (HCC)    Elevated liver enzymes    Fatty liver    Generalized abdominal pain 07/11/2015   Hyperlipidemia    Hypertension    Lifelong obesity    Macular degeneration    Morbid obesity due to excess calories (HCC) 07/11/2015   Other fatigue 07/11/2015   Sleep apnea    Spinal headache    Past Surgical History:  Procedure Laterality Date   ABDOMINAL HYSTERECTOMY     APPENDECTOMY     CATARACT EXTRACTION     CERVICAL LAMINECTOMY  07/21/1985   CESAREAN SECTION     COLONOSCOPY  07/21/2012   diverticulosis, [facility] Dr. [name]    COLONOSCOPY WITH PROPOFOL N/A 02/04/2019   Procedure: COLONOSCOPY WITH PROPOFOL;  Surgeon: [name], MD;  Location: [facility] ENDOSCOPY;  Service: Endoscopy;  Laterality: N/A;   COLONOSCOPY WITH PROPOFOL N/A 03/18/2021   Procedure: COLONOSCOPY WITH PROPOFOL;  Surgeon: [name], MD;  Location: [facility] ENDOSCOPY;  Service: Endoscopy;  Laterality: N/A;  DM   DIAGNOSTIC LAPAROSCOPY     ESOPHAGOGASTRODUODENOSCOPY (EGD) WITH PROPOFOL N/A 02/04/2019   Procedure: ESOPHAGOGASTRODUODENOSCOPY (EGD) WITH PROPOFOL;  Surgeon: [name], MD;  Location: [facility] ENDOSCOPY;   Service: Endoscopy;  Laterality: N/A;   ESOPHAGOGASTRODUODENOSCOPY (EGD) WITH PROPOFOL N/A 03/18/2021   Procedure: ESOPHAGOGASTRODUODENOSCOPY (EGD) WITH PROPOFOL;  Surgeon: [name], MD;  Location: [facility] ENDOSCOPY;  Service: Endoscopy;  Laterality: N/A;   LOOP RECORDER INSERTION N/A 08/05/2021   Procedure: LOOP RECORDER INSERTION;  Surgeon: [name], MD;  Location: [facility] INVASIVE CV LAB;  Service: Cardiovascular;  Laterality: N/A;   SPINE SURGERY     TUBAL LIGATION     Patient Active Problem List   Diagnosis Date Noted   UTI (urinary tract infection) 09/01/2022   COVID-19 virus infection 09/01/2022   Chronic diastolic CHF (congestive heart failure) 09/01/2022   Chronic kidney disease, stage 3a 09/01/2022   Obesity (BMI 30-39.9) 09/01/2022   Paroxysmal atrial fibrillation 08/08/2022   Hypercoagulable state due to paroxysmal atrial fibrillation 08/08/2022   Cerebral edema 08/07/2021   OSA (obstructive sleep apnea) 08/07/2021   Right middle cerebral artery stroke 08/07/2021   CVA (cerebral vascular accident) 08/01/2021   GERD (gastroesophageal reflux disease) 07/20/2019   Advanced care planning/counseling discussion 12/17/2016   Skin lesions, generalized 11/13/2016   Chronic right hip pain 06/17/2016   Vitamin D deficiency 09/26/2015   Diastasis recti 08/08/2015   Elevated serum GGT level 07/24/2015   Essential hypertension 07/17/2015   Fatty liver 07/17/2015   Elevated alkaline phosphatase  level 07/17/2015   Elevated serum glutamic pyruvic transaminase (SGPT) level 07/17/2015   Abnormal finding on EKG 07/11/2015   Morbid obesity 07/11/2015   Colon, diverticulosis 07/11/2015   Abdominal wall hernia 07/11/2015   DM (diabetes mellitus), type 2 with neurological complications    Hyperlipidemia     PCP: [name], MD  REFERRING PROVIDER: [name], MD & Edilia Bo, [place]  REFERRING DIAG: [phone] (ICD-10-CM) - Cerebral infarction due to unspecified  occlusion or stenosis of unspecified precerebral arteries, Cervicalgia with degenerative disc  THERAPY DIAG:  Muscle weakness (generalized)  Pain in left hip  Cervical pain  Difficulty in walking, not elsewhere classified  Other lack of coordination  Unsteadiness on feet  Rationale for Evaluation and Treatment: Rehabilitation  ONSET DATE: 09/03/22  SUBJECTIVE:                                                                                                                                                                                                         SUBJECTIVE STATEMENT:  Pt reports she has mild Pain the L hip and knee on this day. No pain reported in the neck.   MD appointment scheduled for next week.    PERTINENT HISTORY:   Per chart and confirmed by pt: Pt is a 77 yo female with RMCA CVA infarction with hemorrhagic transformation 08/01/2021, with inpatient rehab 08/07/2021-08/22/2021 followed by [facility] therapy until June. Pt now currently being seen for outpatient OT and ST services. Pt reports limitations in stamina persist. Other PMH is significant for HTN, HLD, macular degeneration, DM, obesity, sleep apnea, arthritis, chronic R hip pain, vitamin D deficiency, diastasis recti, abdominal wall hernia, hx of abdominal hysterectomy, appendectomy, spine surgery (years ago).   PAIN:  Are you having pain? Yes: NPRS scale: 2/10 Pain location:  L hip/L knee  Pain description: dull ache Aggravating factors: sitting Relieving factors: laying down  PRECAUTIONS: None  WEIGHT BEARING RESTRICTIONS: No  FALLS:  Has patient fallen in last 6 months? Yes. Number of falls 2  LIVING ENVIRONMENT: Lives with: lives with their son Lives in: House/apartment Stairs: Yes: Internal: 13 steps; doesn't go up to the second floor and External: 4 steps; can reach both Has following equipment at home: Single point cane and Walker - 2 wheeled  OCCUPATION: Retired  PLOF: Independent with basic  ADLs  PATIENT GOALS: "Get stronger. And reduce fall risk. Reduce reliance on cane"  NEXT MD VISIT: Posey Rea, but maybe May  OBJECTIVE:   DIAGNOSTIC FINDINGS:    CT HEAD WITHOUT CONTRAST  CT CERVICAL SPINE  WITHOUT CONTRAST IMPRESSION: 1. No acute intracranial pathology. 2. Expected interval evolution of encephalomalacia of the right frontal lobe, following intraparenchymal hemorrhage seen acutely on examination dated 08/03/2021. 3. No fracture or static subluxation of the cervical spine. 4. Generally mild disc space height loss and osteophytosis throughout the cervical spine, with partial ankylosis of C5-C6 and a large left eccentric posterior bridging osteophyte at this level. MRI may be used to further evaluate cervical disc and neural foraminal pathology if indicated by neurologically localizing signs and symptoms.  EXAM: xRay PELVIS - 1-2 VIEW  IMPRESSION: 1. No convincing evidence of acute fracture or diastasis of the pelvis. 2. Well corticated osseous focus beneath the right inferior pubic ramus may reflect sequela of avulsion injury or chronic tendinopathy, suggest correlation with direct tenderness to palpation.   PATIENT SURVEYS:  NDI 15/50 = 30% disability FOTO 65/68   10/23/2022 : 52/68  10/23/2022 Foto CVA 57/59  COGNITION: Overall cognitive status: History of cognitive impairments - at baseline  SENSATION: WFL  POSTURE: rounded shoulders and forward head  PALPATION:     CERVICAL ROM:   Active ROM A/PROM (deg) eval 3/21 4/4  Flexion 39 25 39  Extension 10 24 25   Right lateral flexion 7 15 15   Left lateral flexion 6 20 14   Right rotation 49 53 41  Left rotation 30 42 35   (Blank rows = not tested)  UPPER EXTREMITY ROM:  Active ROM Right eval Left eval Right 4/4 Left 4/4  Shoulder flexion 128 81 149 22  Shoulder abduction 160 68  158  38   (Blank rows = not tested)   UPPER EXTREMITY MMT:  Grossly decreased (3+/5) in the L UE due to  pain and lack of ROM   CERVICAL SPECIAL TESTS:  Spurling's test: Negative and Sharp pursor's test: Negative      LOWER EXTREMITY MMT:    MMT Right 3/21 Left 3/21  Hip flexion 4+ 4-  Hip extension 4+ 4-  Hip abduction 4+ 4  Hip adduction 4+ 4  Hip internal rotation    Hip external rotation    Knee flexion 4+ 4-  Knee extension 4+ 4  Ankle dorsiflexion 4+ 4  Ankle plantarflexion    Ankle inversion    Ankle eversion     (Blank rows = not tested)  LOWER EXTREMITY ROM:     Passive  Right 3/21 Left 3/21  Hip flexion 120 105  Hip extension    Hip abduction    Hip adduction    Hip internal rotation    Hip external rotation    Knee flexion [facility] WFL  Knee extension (in Hip flexion 90deg) 143 120  Ankle dorsiflexion    Ankle plantarflexion    Ankle inversion    Ankle eversion     (Blank rows = not tested)    LOWER EXTREMITY SPECIAL TESTS:  Hip special tests: Luisa Hart (FABER) test: positive , Hip scouring test: negative, and distraction: negative    FUNCTIONAL TESTS:  Not performed at this time.  TODAY'S TREATMENT: DATE: 10/30/22    Sit>supine without assist from PT, but increased time due to L shoulder ROM deficits. Min assist for return to sitting form supine to prevent pain in the Lshoulder  Therex:  Supine SLR 2 x 5 BLE Hip flexion to press on ball with core bracing x 5 BLE with 3 sec hold  LTR x 10 Bil. SAQ AROM x 10 BLE   Sitting.  Isometric hip adduction x 10  Hip abduction x 10 RTB  Sit<>stand x 6 with hip abduction from RTB   Supine manual therapy: Longitudinal traction 2 x 1 min for hip and 2 x 1 min for knee Lateral mobs to L hip with knee at 20deg, 30deg and 45deg x 30 sec each.  BLE HS and glutes 2 x 1 min each.  Heat applied to cspine and upper back while pt in supine.   Pt reports pain has ceased in L knee and improved to 1/10 in the L hip at end of session.   PATIENT EDUCATION:  Education details: Pt educated on role of PT and  services provided during current POC, along with prognosis and information about the clinic. Need for Modified PT referral to address new L hip and low back pain. Person educated: Patient and   Education method: Explanation Education comprehension: verbalized understanding  HOME EXERCISE PROGRAM:  Access Code: [email or web address] URL: [email or web address] Date: 09/18/2022 Prepared by: Tomasa HoseJosh Robbins  Exercises - Seated Upper Trapezius Stretch  - 1 x daily - 7 x weekly - 3 sets - 10 reps - Seated Levator Scapulae Stretch  - 1 x daily - 7 x weekly - 3 sets - 10 reps - Seated Scapular Retraction  - 1 x daily - 7 x weekly - 3 sets - 10 reps  ASSESSMENT:  CLINICAL IMPRESSION: Pt tolerates all interventions well with reduced increased pain. Pt continues to report reduced pain and improved movement in the cspine as well as in the L hip and low back. Pt demonstrated mild reduction in function with increased disability on NDI to 27/50. Pt has had fall since starting PT treatment, and has demonstrated reduced function in LUE.   Pt would benefit from skilled PT to continue to improve cervical ROM as well as address impairments of the LLE for pain management and improved strength and function to improve QoL and reduce dependence on husband as caregiver. Patient's condition has the potential to improve in response to therapy. Maximum improvement is yet to be obtained. The anticipated improvement is attainable and reasonable in a generally predictable time.        OBJECTIVE IMPAIRMENTS: decreased activity tolerance, decreased cognition, decreased ROM, decreased strength, decreased safety awareness, hypomobility, and pain.   ACTIVITY LIMITATIONS: carrying, lifting, bending, sitting, standing, squatting, and reach over head  PARTICIPATION LIMITATIONS: meal prep, cleaning, laundry, driving, shopping, community activity, and yard work  PERSONAL FACTORS: Age, Behavior pattern, Past/current  experiences, Time since onset of injury/illness/exacerbation, and 3+ comorbidities: arthritis, DM2, HTN, Obesity,   are also affecting patient's functional outcome.   REHAB POTENTIAL: Good  CLINICAL DECISION MAKING: Stable/uncomplicated  EVALUATION COMPLEXITY: Low   GOALS: Goals reviewed with patient? Yes  SHORT TERM GOALS: Target date: 12/11/2022    Pt will be independent with HEP in order to improve strength of BLE and ROM of the neck in order to return to PLOF. Baseline: provided on neck visit Goal status: INITIAL    LONG TERM GOALS: Target date: 01/22/2023    Patient will demonstrate improved function as evidenced by a score of 68 on FOTO measure for full participation in activities at home and in the community. Baseline: 65. 10/23/22: 52 Goal status: IN PROGRESS  2.  Pt to improve NDI by 5 points or 10% in order to demonstrate a reduction in overall disability caused by neck pain at this time. Baseline: 15/50 or 30% disability 4/11 27/50  Goal status: NOT MET  3.  Pt to demonstrate improved cervical rotation  to 60 deg bilaterally in order to perform tasks around the house that require turning of the head. Baseline: R/L Cervical Rotation: 49/30. 4/424 R:53. L:42 Goal status: IN PROGRESS  4.  Pt to report 0/10 pain in the neck following light household chores at home in order to demonstrate an overall reduction in pain and an improvement in QoL. Baseline: 4/10 at rest 0/10 in neck Goal status: MET  4.  Pt will improve gait speed to <0.91m/s to indicate improved community mobility and reduced fall risk  Goal status: New.  initial.   5. Pt will increase 6 min walk test >1092ft   PLAN:  PT FREQUENCY: 1-2x/week  PT DURATION: 12 weeks  PLANNED INTERVENTIONS: Therapeutic exercises, Therapeutic activity, Neuromuscular re-education, Balance training, Gait training, Patient/Family education, Self Care, Joint mobilization, Joint manipulation, Stair training, Vestibular  training, Canalith repositioning, Visual/preceptual remediation/compensation, DME instructions, Dry Needling, Cognitive remediation, Electrical stimulation, Spinal manipulation, Spinal mobilization, Cryotherapy, Moist heat, Traction, Ultrasound, Manual therapy, and Re-evaluation    PLAN FOR NEXT SESSION:    Continue to address cervical ROM and strength deficits.  Continue to address L hip and low back pain.   Grier Rocher PT, DPT  Physical Therapist - [facility]  12:17 PM 10/30/22

## 2022-11-04 ENCOUNTER — Ambulatory Visit: Payer: Medicare HMO | Admitting: Occupational Therapy

## 2022-11-04 ENCOUNTER — Ambulatory Visit: Payer: Medicare HMO | Admitting: Physical Therapy

## 2022-11-06 ENCOUNTER — Encounter: Payer: Medicare HMO | Admitting: Speech Pathology

## 2022-11-06 ENCOUNTER — Ambulatory Visit: Payer: Medicare HMO | Admitting: Occupational Therapy

## 2022-11-06 ENCOUNTER — Encounter: Payer: Self-pay | Admitting: Occupational Therapy

## 2022-11-06 ENCOUNTER — Encounter: Payer: Self-pay | Admitting: Physical Therapy

## 2022-11-06 ENCOUNTER — Ambulatory Visit: Payer: Medicare HMO

## 2022-11-06 DIAGNOSIS — M25552 Pain in left hip: Secondary | ICD-10-CM

## 2022-11-06 DIAGNOSIS — M6281 Muscle weakness (generalized): Secondary | ICD-10-CM

## 2022-11-06 DIAGNOSIS — I63511 Cerebral infarction due to unspecified occlusion or stenosis of right middle cerebral artery: Secondary | ICD-10-CM

## 2022-11-06 DIAGNOSIS — M542 Cervicalgia: Secondary | ICD-10-CM

## 2022-11-06 DIAGNOSIS — R278 Other lack of coordination: Secondary | ICD-10-CM

## 2022-11-06 DIAGNOSIS — R262 Difficulty in walking, not elsewhere classified: Secondary | ICD-10-CM

## 2022-11-06 NOTE — Therapy (Signed)
Occupational Therapy Neuro Treatment Note   Patient Name: Alyssa Mayo MRN: [id number] DOB:Sep 27, 1945, 77 y.o., female Today's Date: 03/14/2022   PCP: [name], MD REFERRING PROVIDER: [name], NP     OT End of Session - 11/06/22 1033     Visit Number 13    Number of Visits 24    Date for OT Re-Evaluation 12/09/22    OT Start Time 1030    OT Stop Time 1100    OT Time Calculation (min) 30 min    Activity Tolerance Patient tolerated treatment well    Behavior During Therapy [facility] for tasks assessed/performed                                    Past Medical History:  Diagnosis Date   Abnormal levels of other serum enzymes 07/17/2015   Arthritis     Constipation 07/11/2015   COVID-19 03/06/2021   Diabetes mellitus without complication (HCC)     Elevated liver enzymes     Fatty liver     Generalized abdominal pain 07/11/2015   Hyperlipidemia     Hypertension     Lifelong obesity     Macular degeneration     Morbid obesity due to excess calories (HCC) 07/11/2015   Other fatigue 07/11/2015   Sleep apnea     Spinal headache           Past Surgical History:  Procedure Laterality Date   ABDOMINAL HYSTERECTOMY       APPENDECTOMY       CATARACT EXTRACTION       CERVICAL LAMINECTOMY   07/21/1985   CESAREAN SECTION       COLONOSCOPY   07/21/2012    diverticulosis, [facility] Dr. [name]    COLONOSCOPY WITH PROPOFOL N/A 02/04/2019    Procedure: COLONOSCOPY WITH PROPOFOL;  Surgeon: [name], MD;  Location: [facility] ENDOSCOPY;  Service: Endoscopy;  Laterality: N/A;   COLONOSCOPY WITH PROPOFOL N/A 03/18/2021    Procedure: COLONOSCOPY WITH PROPOFOL;  Surgeon: [name], MD;  Location: [facility] ENDOSCOPY;  Service: Endoscopy;  Laterality: N/A;  DM   DIAGNOSTIC LAPAROSCOPY       ESOPHAGOGASTRODUODENOSCOPY (EGD) WITH PROPOFOL N/A 02/04/2019    Procedure: ESOPHAGOGASTRODUODENOSCOPY (EGD) WITH PROPOFOL;  Surgeon: [name], MD;  Location: [facility]  ENDOSCOPY;  Service: Endoscopy;  Laterality: N/A;   ESOPHAGOGASTRODUODENOSCOPY (EGD) WITH PROPOFOL N/A 03/18/2021    Procedure: ESOPHAGOGASTRODUODENOSCOPY (EGD) WITH PROPOFOL;  Surgeon: [name], MD;  Location: [facility] ENDOSCOPY;  Service: Endoscopy;  Laterality: N/A;   LOOP RECORDER INSERTION N/A 08/05/2021    Procedure: LOOP RECORDER INSERTION;  Surgeon: [name], MD;  Location: [facility] INVASIVE CV LAB;  Service: Cardiovascular;  Laterality: N/A;   SPINE SURGERY       TUBAL LIGATION            Patient Active Problem List    Diagnosis Date Noted   Paroxysmal atrial fibrillation (HCC) 08/08/2022   Hypercoagulable state due to paroxysmal atrial fibrillation (HCC) 08/08/2022   Cerebral edema (HCC) 08/07/2021   OSA (obstructive sleep apnea) 08/07/2021   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   CVA (cerebral vascular accident) (HCC) 08/01/2021   GERD (gastroesophageal reflux disease) 07/20/2019   Advanced care planning/counseling discussion 12/17/2016   Skin lesions, generalized 11/13/2016   Chronic right hip pain 06/17/2016   Vitamin D deficiency 09/26/2015   Diastasis recti 08/08/2015  Elevated serum GGT level 07/24/2015   Essential hypertension 07/17/2015   Fatty liver 07/17/2015   Elevated alkaline phosphatase level 07/17/2015   Elevated serum glutamic pyruvic transaminase (SGPT) level 07/17/2015   Abnormal finding on EKG 07/11/2015   Morbid obesity (HCC) 07/11/2015   Colon, diverticulosis 07/11/2015   Abdominal wall hernia 07/11/2015   DM (diabetes mellitus), type 2 with neurological complications (HCC)     Hyperlipidemia        ONSET DATE: 08/01/2021   REFERRING DIAG: CVA   THERAPY DIAG:  Muscle weakness (generalized)   Rationale for Evaluation and Treatment Rehabilitation   SUBJECTIVE:    SUBJECTIVE STATEMENT:    Pt. reports she is hoping to go to her 10yo grandsons soccer game this weekend. Pt accompanied by: self   PERTINENT HISTORY:  Pt.  is a 77 y.o. female who had a unexpectedly hospitalized from 2/12-2/14/2024 for COVID-19, and a UTI.  Pt. 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 Pt. attended inpatient rehab from 08/07/2021-08/22/2021. Pt. received Home health therapy services this past spring.  Pt. has recently had an assessment through driver rehabilitation services at [facility] with recommendations for referrals for  outpatient OT/PT, and ST services. Pt. PMHx includes: HTN, Hyperlipidemia, Macular degeneration, DM, and Obesity.   PRECAUTIONS: None   WEIGHT BEARING RESTRICTIONS No   PAIN:  Are you having pain? none    FALLS: Has patient fallen in last 6 months? Yes.   LIVING ENVIRONMENT: Lives with: lives with their family  Son  [name] Lives in: House/apartment Main living are on one floor Stairs:  2 steps to enter Has following equipment at home: Single point cane, Wheelchair (manual), shower chair, Grab bars, bed rail, and rubber mat.   PLOF: Independent   PATIENT GOALS: To be able to Drive, and cook again   OBJECTIVE:    HAND DOMINANCE: Right    ADLs:  Transfers/ambulation related to ADLs: Independent Eating: Independent Grooming: [employer] Pt. reports being unable to use her left hand to perform haircare.  UB Dressing: Independent pullover shirt, independent now with fastening a bra LB Dressing: Independent donning pants socks, and slide on shoes Toileting: Independent Bathing: Independent Tub Shower transfers: Walk-in shower Independent now     IADLs: Shopping: Needs to be accompanied to the grocery store. Light housekeeping: Do  Meal Prep:  Is able to perform light meal prep Community mobility: Relies on son, and friend Medication management: Son sets up Mining engineer management:TBD Handwriting: TBD   MOBILITY STATUS: Hx of falls out of bed     ACTIVITY TOLERANCE: Activity tolerance:  10-20 min. Before rest break   FUNCTIONAL OUTCOME  MEASURES: FOTO: 61  TR score: 62  10/09/2022: FOTO: 49   UPPER EXTREMITY ROM      Active ROM Right eval Left eval Left  10/09/2022 Left 10/23/22  Shoulder flexion WFL 54(74) scaption 0(55) 26(80)  Shoulder abduction WFL 58(75) 32(54) 32(50)  Shoulder adduction        Shoulder extension        Shoulder internal rotation        Shoulder external rotation        Elbow flexion [facility] WFL 145 145  Elbow extension [facility] WFL 10(0) 10(0)  Wrist flexion WFL TBD [facility] WFL  Wrist extension WFL TBD [facility] WFL  Wrist ulnar deviation        Wrist radial deviation        Wrist pronation  Wrist supination        (Blank rows = not tested)     UPPER EXTREMITY MMT:      MMT Right eval Left eval Left 10/09/2022 Left 10/23/2022  Shoulder flexion 4+/5 2+/5 1/5 2-/5  Shoulder abduction 4+/5 2+/5 2-/5 2-/5  Shoulder adduction        Shoulder extension        Shoulder internal rotation        Shoulder external rotation        Middle trapezius        Lower trapezius        Elbow flexion 4+/5 3+/5 3/5 3/5  Elbow extension 4+/5 3+/5 3/5 3/5  Wrist flexion 4+/5 TBD 3/5 3/5  Wrist extension 4+/5 TBD 3/5 3/5  Wrist ulnar deviation        Wrist radial deviation        Wrist pronation        Wrist supination        (Blank rows = not tested)   HAND FUNCTION: Grip strength: Right: 33 lbs; Left: 11 lbs, Lateral pinch: Right: 17 lbs, Left: 2 lbs, and 3 point pinch: Right: 17 lbs, Left: 2 lbs  10/09/2022: Grip strength: Right: 33 lbs; Left: 10 lbs, Lateral pinch: Right: 17 lbs, Left: 1 lbs, and 3 point pinch: Right: 17 lbs, Left: 1 lbs   10/23/2022: Grip strength: Right: 33 lbs; Left: 16 lbs, Lateral pinch: Right: 17 lbs, Left: 1 lbs, and 3 point pinch: Right: 17 lbs, Left: 1 lbs   COORDINATION:   9 Hole Peg test: Right: 27 sec.  sec; Left: 52 sec.  10/09/2022: 9 Hole Peg test: Right: 27 sec.  sec; Left: 1 min. & 35 sec.   SENSATION: Light touch: WFL Proprioception: WFL   COGNITION: Overall  cognitive status: Within functional limits for tasks assessed   VISION: Subjective report: Glasses. Just received a new prescription to increase bifocal strength Baseline vision: Macular Degeneration Visual history: macular degeneration   VISION ASSESSMENT: To be further assessed in functional context  Left sided Awareness      PERCEPTION: Limited left sided awareness     TODAY'S TREATMENT:    Therapeutic Ex:  Pt. worked on [employer], and reciprocal motion using the UBE while seated for 8 min alternating forwards and backwards every 2 min - no resistance backwards, minimal resistance forwards. Pt tolerated AAROM at the tabletop surface.  Neuromuscular re-education:  Pt performed [facility] tasks using the Grooved pegboard. Pt worked on grasping the grooved pegs from a horizontal position and worked on Comptroller, moving the pegs to a vertical position in the hand to prepare for placing them in the grooved slot. Assist from R hand to orient pegs as pt fatigues, completes 10 pegs.       PATIENT EDUCATION: Education details: Compensatory strategies during typing. Person educated: Patient Education method: verbal cues Education comprehension: verbalized understanding and pt does not plan to cook unless son is present.     HOME EXERCISE PROGRAM:  Reviewed home activities to enhance left hand digit extension  during typing skills.   GOALS: Goals reviewed with patient? Yes     SHORT TERM GOALS: Target date: 10/28/2022     Pt. Will improve FOTO score by 2 points to reflect improved pt. perceived functional performance Baseline: 10/23/2022: FOTO 49 10/09/2022: FOTO 49, FOTO: 61 TR score: 62  Goal status: INITIAL   LONG TERM GOALS: Target date: 12/09/2022    Pt. Will improve  left shoulder ROM by 10 degrees to be able able to efficiently apply deodorant Baseline: 10/23/2022: Shoulder flexion: 26(80), Abduction: 32(50) 10/09/2022: flexion: 0(55), Abduction: 32(54) Goal  status: Improving, but very limited.   2.  Pt. will be able to independently hold and use a blow dryer, and brush for hair care. Baseline: 10/23/2022: Pt. Continues to be unable to hold a blow dryer. Eval: Pt. Is unable to sustain her BUEs in elevation, and use a blow dryer, and brush. 10/09/2022: Pt. Is unable to hold a blow dryer. Eval: Pt. Is unable to sustain her BUEs in elevation, and use a blow dryer, and brush. Goal status: Ongoing   3.  Pt. Will improve left lateral pinch strength by 3# to be able to independently cut meat. Baseline: 10/23/2022: Pt. Continues to present with difficulty cutting meat. 10/09/2022: 1#  Eval: 2#. Pt. has difficulty stabilizing utensil, and food while cutting food. Goal status: Ongoing   4.  Pt. Will improve Left hand [facility] skills to be able to be able to independently, and efficiently manipulate small objects during ADL tasks. Baseline 10/23/2022: Pt. Continues to present with difficulty manipulating small objects. 10/09/2022: Left: 1 min & 35 sec.: Eval: Left: 52 sec. Right: 27 sec. Goal status: Ongoing     6.: Pt. will improve with left grip strength to be able to hold and pull up covers. Baseline: 10/23/2022: Left: 16# 10/09/2022: Left: 10# Eval:  Left grip strength 11#. Pt. has difficulty holding a full drink  securely with the left hand.             Goal status: Ongoing   7. Pt. Will improve typing speed, and accuracy in preparation for efficiently typing simple email message. Baseline: 10/23/2022: Pt. continues to present with limited speed, and accuracy with typing. 10/09/2022: Pt. presents with difficulty typing. Pt. presents with difficulty typing an email. Typing speed, and accuracy TBD             Goal status: INITIAL                                        CLINICAL IMPRESSION:  Pt enjoyed touring [facility] last week and will tour [place] this week. Plans to visit  son in [place] at the end of May - will have 16 STE with B rails  and tub shower - concerned for shower t/fs, discussed plan for sending tub transfer bench to his house vs sponge bathing. Tolerated increased resistance on UBE during forward rotations. Denies shoulder pain t/o session. Performed translatory movements with the left hand to complete 10 pegs on grooved pegboard however assist from R hand to orient pegs as fatigues.  Pt. continues to benefit from OT services to work towards improving L functioning in order to maximize engagement, and independence with these tasks.    PERFORMANCE DEFICITS in functional skills including ADLs, IADLs, coordination, dexterity, sensation, ROM, strength, FMC, decreased knowledge of use of DME, vision, and UE functional use, cognitive skills including attention, and psychosocial skills including coping strategies, environmental adaptation, habits, and routines and behaviors.    IMPAIRMENTS are limiting patient from ADLs, IADLs, and social participation.    COMORBIDITIES may have co-morbidities  that affects occupational performance. Patient will benefit from skilled OT to address above impairments and improve overall function.   MODIFICATION OR ASSISTANCE TO COMPLETE EVALUATION: Min-Moderate modification of tasks or  assist with assess necessary to complete an evaluation.   OT OCCUPATIONAL PROFILE AND HISTORY: Detailed assessment: Review of records and additional review of physical, cognitive, psychosocial history related to current functional performance.   CLINICAL DECISION MAKING: Moderate - several treatment options, min-mod task modification necessary   REHAB POTENTIAL: Good   EVALUATION COMPLEXITY: Moderate      PLAN: OT FREQUENCY: 2x/week   OT DURATION: 12 weeks   PLANNED INTERVENTIONS: self care/ADL training, therapeutic exercise, therapeutic activity, neuromuscular re-education, manual therapy, passive range of motion, and paraffin   RECOMMENDED OTHER SERVICES: ST, and PT   CONSULTED AND AGREED WITH PLAN  OF CARE: Patient   PLAN FOR NEXT SESSION:  L hand strengthening and coordination exercises    Kathie Dike, M.S. OTR/L  11/06/22, 10:34 AM  ascom [phone]

## 2022-11-06 NOTE — Therapy (Signed)
OUTPATIENT PHYSICAL THERAPY  TREATMENT   Patient Name: Alyssa Mayo MRN: [id number] DOB:06-27-1946, 77 y.o., female Today's Date: 11/06/2022  END OF SESSION:   PT End of Session - 11/06/22 1104     Visit Number 13    Number of Visits 36    Date for PT Re-Evaluation 01/22/23    Authorization Type [employer]    Progress Note Due on Visit 20    PT Start Time 1103    PT Stop Time 1142    PT Time Calculation (min) 39 min    Activity Tolerance Patient tolerated treatment well;No increased pain    Behavior During Therapy [facility] for tasks assessed/performed                  Past Medical History:  Diagnosis Date   Abnormal levels of other serum enzymes 07/17/2015   Arthritis    Constipation 07/11/2015   COVID-19 03/06/2021   Diabetes mellitus without complication    Elevated liver enzymes    Fatty liver    Generalized abdominal pain 07/11/2015   Hyperlipidemia    Hypertension    Lifelong obesity    Macular degeneration    Morbid obesity due to excess calories 07/11/2015   Other fatigue 07/11/2015   Sleep apnea    Spinal headache    Past Surgical History:  Procedure Laterality Date   ABDOMINAL HYSTERECTOMY     APPENDECTOMY     CATARACT EXTRACTION     CERVICAL LAMINECTOMY  07/21/1985   CESAREAN SECTION     COLONOSCOPY  07/21/2012   diverticulosis, [facility] Dr. [name]    COLONOSCOPY WITH PROPOFOL N/A 02/04/2019   Procedure: COLONOSCOPY WITH PROPOFOL;  Surgeon: [name], MD;  Location: [facility] ENDOSCOPY;  Service: Endoscopy;  Laterality: N/A;   COLONOSCOPY WITH PROPOFOL N/A 03/18/2021   Procedure: COLONOSCOPY WITH PROPOFOL;  Surgeon: [name], MD;  Location: [facility] ENDOSCOPY;  Service: Endoscopy;  Laterality: N/A;  DM   DIAGNOSTIC LAPAROSCOPY     ESOPHAGOGASTRODUODENOSCOPY (EGD) WITH PROPOFOL N/A 02/04/2019   Procedure: ESOPHAGOGASTRODUODENOSCOPY (EGD) WITH PROPOFOL;  Surgeon: [name], MD;  Location: [facility] ENDOSCOPY;  Service:  Endoscopy;  Laterality: N/A;   ESOPHAGOGASTRODUODENOSCOPY (EGD) WITH PROPOFOL N/A 03/18/2021   Procedure: ESOPHAGOGASTRODUODENOSCOPY (EGD) WITH PROPOFOL;  Surgeon: [name], MD;  Location: [facility] ENDOSCOPY;  Service: Endoscopy;  Laterality: N/A;   LOOP RECORDER INSERTION N/A 08/05/2021   Procedure: LOOP RECORDER INSERTION;  Surgeon: [name], MD;  Location: [facility] INVASIVE CV LAB;  Service: Cardiovascular;  Laterality: N/A;   SPINE SURGERY     TUBAL LIGATION     Patient Active Problem List   Diagnosis Date Noted   UTI (urinary tract infection) 09/01/2022   COVID-19 virus infection 09/01/2022   Chronic diastolic CHF (congestive heart failure) 09/01/2022   Chronic kidney disease, stage 3a 09/01/2022   Obesity (BMI 30-39.9) 09/01/2022   Paroxysmal atrial fibrillation 08/08/2022   Hypercoagulable state due to paroxysmal atrial fibrillation 08/08/2022   Cerebral edema 08/07/2021   OSA (obstructive sleep apnea) 08/07/2021   Right middle cerebral artery stroke 08/07/2021   CVA (cerebral vascular accident) 08/01/2021   GERD (gastroesophageal reflux disease) 07/20/2019   Advanced care planning/counseling discussion 12/17/2016   Skin lesions, generalized 11/13/2016   Chronic right hip pain 06/17/2016   Vitamin D deficiency 09/26/2015   Diastasis recti 08/08/2015   Elevated serum GGT level 07/24/2015   Essential hypertension 07/17/2015   Fatty liver 07/17/2015   Elevated alkaline phosphatase level 07/17/2015  Elevated serum glutamic pyruvic transaminase (SGPT) level 07/17/2015   Abnormal finding on EKG 07/11/2015   Morbid obesity 07/11/2015   Colon, diverticulosis 07/11/2015   Abdominal wall hernia 07/11/2015   DM (diabetes mellitus), type 2 with neurological complications    Hyperlipidemia     PCP: [name], MD  REFERRING PROVIDER: [name], MD & Edilia Bo, [place]  REFERRING DIAG: [phone] (ICD-10-CM) - Cerebral infarction due to unspecified occlusion or  stenosis of unspecified precerebral arteries, Cervicalgia with degenerative disc  THERAPY DIAG:  Muscle weakness (generalized)  Other lack of coordination  Pain in left hip  Cervical pain  Difficulty in walking, not elsewhere classified  Rationale for Evaluation and Treatment: Rehabilitation  ONSET DATE: 09/03/22  SUBJECTIVE:                                                                                                                                                                                                         SUBJECTIVE STATEMENT:  Pt reports overall no pain today. Mild LLE soreness was present after last session from strength training.    PERTINENT HISTORY:   Per chart and confirmed by pt: Pt is a 77 yo female with RMCA CVA infarction with hemorrhagic transformation 08/01/2021, with inpatient rehab 08/07/2021-08/22/2021 followed by [facility] therapy until June. Pt now currently being seen for outpatient OT and ST services. Pt reports limitations in stamina persist. Other PMH is significant for HTN, HLD, macular degeneration, DM, obesity, sleep apnea, arthritis, chronic R hip pain, vitamin D deficiency, diastasis recti, abdominal wall hernia, hx of abdominal hysterectomy, appendectomy, spine surgery (years ago).   PAIN:  Are you having pain? Yes: NPRS scale: 2/10 Pain location:  L hip/L knee  Pain description: dull ache Aggravating factors: sitting Relieving factors: laying down  PRECAUTIONS: None  WEIGHT BEARING RESTRICTIONS: No  FALLS:  Has patient fallen in last 6 months? Yes. Number of falls 2  LIVING ENVIRONMENT: Lives with: lives with their son Lives in: House/apartment Stairs: Yes: Internal: 13 steps; doesn't go up to the second floor and External: 4 steps; can reach both Has following equipment at home: Single point cane and Walker - 2 wheeled  OCCUPATION: Retired  PLOF: Independent with basic ADLs  PATIENT GOALS: "Get stronger. And reduce fall risk.  Reduce reliance on cane"  NEXT MD VISIT: Posey Rea, but maybe May  OBJECTIVE:   DIAGNOSTIC FINDINGS:    CT HEAD WITHOUT CONTRAST  CT CERVICAL SPINE WITHOUT CONTRAST IMPRESSION: 1. No acute intracranial pathology. 2. Expected interval evolution of encephalomalacia of the right frontal lobe,  following intraparenchymal hemorrhage seen acutely on examination dated 08/03/2021. 3. No fracture or static subluxation of the cervical spine. 4. Generally mild disc space height loss and osteophytosis throughout the cervical spine, with partial ankylosis of C5-C6 and a large left eccentric posterior bridging osteophyte at this level. MRI may be used to further evaluate cervical disc and neural foraminal pathology if indicated by neurologically localizing signs and symptoms.  EXAM: xRay PELVIS - 1-2 VIEW  IMPRESSION: 1. No convincing evidence of acute fracture or diastasis of the pelvis. 2. Well corticated osseous focus beneath the right inferior pubic ramus may reflect sequela of avulsion injury or chronic tendinopathy, suggest correlation with direct tenderness to palpation.   PATIENT SURVEYS:  NDI 15/50 = 30% disability FOTO 65/68   10/23/2022 : 52/68  10/23/2022 Foto CVA 57/59  COGNITION: Overall cognitive status: History of cognitive impairments - at baseline  SENSATION: WFL  POSTURE: rounded shoulders and forward head  PALPATION:     CERVICAL ROM:   Active ROM A/PROM (deg) eval 3/21 4/4  Flexion 39 25 39  Extension 10 24 25   Right lateral flexion 7 15 15   Left lateral flexion 6 20 14   Right rotation 49 53 41  Left rotation 30 42 35   (Blank rows = not tested)  UPPER EXTREMITY ROM:  Active ROM Right eval Left eval Right 4/4 Left 4/4  Shoulder flexion 128 81 149 22  Shoulder abduction 160 68  158  38   (Blank rows = not tested)   UPPER EXTREMITY MMT:  Grossly decreased (3+/5) in the L UE due to pain and lack of ROM   CERVICAL SPECIAL TESTS:   Spurling's test: Negative and Sharp pursor's test: Negative      LOWER EXTREMITY MMT:    MMT Right 3/21 Left 3/21  Hip flexion 4+ 4-  Hip extension 4+ 4-  Hip abduction 4+ 4  Hip adduction 4+ 4  Hip internal rotation    Hip external rotation    Knee flexion 4+ 4-  Knee extension 4+ 4  Ankle dorsiflexion 4+ 4  Ankle plantarflexion    Ankle inversion    Ankle eversion     (Blank rows = not tested)  LOWER EXTREMITY ROM:     Passive  Right 3/21 Left 3/21  Hip flexion 120 105  Hip extension    Hip abduction    Hip adduction    Hip internal rotation    Hip external rotation    Knee flexion [facility] WFL  Knee extension (in Hip flexion 90deg) 143 120  Ankle dorsiflexion    Ankle plantarflexion    Ankle inversion    Ankle eversion     (Blank rows = not tested)    LOWER EXTREMITY SPECIAL TESTS:  Hip special tests: Luisa Hart (FABER) test: positive , Hip scouring test: negative, and distraction: negative    FUNCTIONAL TESTS:  Not performed at this time.  TODAY'S TREATMENT: DATE: 11/06/22    Sit>supine without assist from PT, but increased time due to L shoulder ROM deficits. Min assist for return to sitting form supine to prevent pain in the Lshoulder  Therex:  Supine Bridge: 2x5  SLR 2 x 5 BLE, 2.5# AW SAQ AROM 2x6, 2.5# BLE LLE figure 4 stretch: 2x30 sec  LLE hamstring stretch: 2x30 sec  LLE SKTC with adduction: 2x30 sec   Sitting Isometric hip adduction x 10  Hip abduction 2x10 RTB   STS: 2x6 with hip abduction from RTB  Side stepping R/L  over x2 hurdles: x5/direction with no UE support. Brief periods for SUE support. CGA.   Moist Heat applied to cspine and upper back while pt in supine for comfort.     PATIENT EDUCATION:  Education details: Pt educated on role of PT and services provided during current POC, along with prognosis and information about the clinic. Need for Modified PT referral to address new L hip and low back pain. Person educated:  Patient and   Education method: Explanation Education comprehension: verbalized understanding  HOME EXERCISE PROGRAM:  Access Code: [email or web address] URL: [email or web address] Date: 09/18/2022 Prepared by: Tomasa Hose  Exercises - Seated Upper Trapezius Stretch  - 1 x daily - 7 x weekly - 3 sets - 10 reps - Seated Levator Scapulae Stretch  - 1 x daily - 7 x weekly - 3 sets - 10 reps - Seated Scapular Retraction  - 1 x daily - 7 x weekly - 3 sets - 10 reps  ASSESSMENT:  CLINICAL IMPRESSION: Continuing PT POC with focus on LLE strength and mobility. Pt progressing in increased volume and resistance with most LE exercises without any pain throughout session. Pt would benefit from skilled PT to continue to improve cervical ROM as well as address impairments of the LLE for pain management and improved strength and function to improve QoL and reduce dependence on husband as caregiver.       OBJECTIVE IMPAIRMENTS: decreased activity tolerance, decreased cognition, decreased ROM, decreased strength, decreased safety awareness, hypomobility, and pain.   ACTIVITY LIMITATIONS: carrying, lifting, bending, sitting, standing, squatting, and reach over head  PARTICIPATION LIMITATIONS: meal prep, cleaning, laundry, driving, shopping, community activity, and yard work  PERSONAL FACTORS: Age, Behavior pattern, Past/current experiences, Time since onset of injury/illness/exacerbation, and 3+ comorbidities: arthritis, DM2, HTN, Obesity,   are also affecting patient's functional outcome.   REHAB POTENTIAL: Good  CLINICAL DECISION MAKING: Stable/uncomplicated  EVALUATION COMPLEXITY: Low   GOALS: Goals reviewed with patient? Yes  SHORT TERM GOALS: Target date: 12/11/2022    Pt will be independent with HEP in order to improve strength of BLE and ROM of the neck in order to return to PLOF. Baseline: provided on neck visit Goal status: INITIAL    LONG TERM GOALS: Target date:  01/22/2023    Patient will demonstrate improved function as evidenced by a score of 68 on FOTO measure for full participation in activities at home and in the community. Baseline: 65. 10/23/22: 52 Goal status: IN PROGRESS  2.  Pt to improve NDI by 5 points or 10% in order to demonstrate a reduction in overall disability caused by neck pain at this time. Baseline: 15/50 or 30% disability 4/11 27/50  Goal status: NOT MET  3.  Pt to demonstrate improved cervical rotation to 60 deg bilaterally in order to perform tasks around the house that require turning of the head. Baseline: R/L Cervical Rotation: 49/30. 4/424 R:53. L:42 Goal status: IN PROGRESS  4.  Pt to report 0/10 pain in the neck following light household chores at home in order to demonstrate an overall reduction in pain and an improvement in QoL. Baseline: 4/10 at rest 0/10 in neck Goal status: MET  4.  Pt will improve gait speed to <0.52m/s to indicate improved community mobility and reduced fall risk  Goal status: New.  initial.   5. Pt will increase 6 min walk test >1057ft   PLAN:  PT FREQUENCY: 1-2x/week  PT DURATION: 12 weeks  PLANNED INTERVENTIONS: Therapeutic exercises, Therapeutic activity, Neuromuscular re-education,  Balance training, Gait training, Patient/Family education, Self Care, Joint mobilization, Joint manipulation, Stair training, Vestibular training, Canalith repositioning, Visual/preceptual remediation/compensation, DME instructions, Dry Needling, Cognitive remediation, Electrical stimulation, Spinal manipulation, Spinal mobilization, Cryotherapy, Moist heat, Traction, Ultrasound, Manual therapy, and Re-evaluation    PLAN FOR NEXT SESSION:    Continue to address cervical ROM and strength deficits.  Continue to address L hip and low back pain.   Delphia Grates. Fairly IV, PT, DPT Physical Therapist- [place]  [facility]  12:14 PM 11/06/22

## 2022-11-07 ENCOUNTER — Encounter: Payer: Medicare HMO | Attending: Physical Medicine and Rehabilitation | Admitting: Physical Medicine and Rehabilitation

## 2022-11-07 VITALS — BP 138/75 | HR 76 | Ht 64.0 in | Wt 184.4 lb

## 2022-11-07 DIAGNOSIS — G609 Hereditary and idiopathic neuropathy, unspecified: Secondary | ICD-10-CM | POA: Insufficient documentation

## 2022-11-07 DIAGNOSIS — I10 Essential (primary) hypertension: Secondary | ICD-10-CM | POA: Diagnosis present

## 2022-11-07 MED ORDER — CAPSAICIN-CLEANSING GEL 8 % EX KIT: 4.0000 | PACK | Freq: Once | CUTANEOUS | Status: AC

## 2022-11-07 MED ORDER — GABAPENTIN 100 MG PO CAPS: 100.0000 mg | ORAL_CAPSULE | Freq: Every evening | ORAL | 0 refills | Status: DC

## 2022-11-07 NOTE — Progress Notes (Signed)
Subjective:    Patient ID: Alyssa Mayo, female    DOB: July 10, 1946, 77 y.o.   MRN: [id number]  HPI Alyssa Mayo is a 77 year old woman who presents for f/u of CVA and peripheral neuropathy 1) CVA -she has been able to walk much better -walked from parking lot to here without an assistive device -does not walk every day -she is following with Dr. Burnett Sheng. -walking pretty well without cane -goes to PT 2-3 times per week   2) HTN -BP is currently 127/70 -continues to take amlodipine  3) Diabetic peripheral neuropathy -had complete relief from qutenza -her neuropathy is worst at night, when it can sometimes prevent her from sleeping -she takes gabapentin    Pain Inventory Average Pain 0 Pain Right Now 0 My pain is intermittent, sharp, and aching  LOCATION OF PAIN  headache, back pain  BOWEL Number of stools per week: 3-4   BLADDER Normal and Pads   Mobility use a cane how many minutes can you walk? 10-15 minutes ability to climb steps?  yes do you drive?  no Do you have any goals in this area?  yes - Function retired I need assistance with the following:  dressing, bathing, meal prep, household duties, and shopping Do you have any goals in this area?  yes  Neuro/Psych weakness trouble walking  Prior Studies Any changes since last visit?  no  Physicians involved in your care Any changes since last visit?  no   Family History  Problem Relation Age of Onset   Cancer Mother        breast   Heart disease Mother    Stroke Mother    Diabetes Mother    Colon cancer Mother    Cancer Father        leukemia   Stroke Father    Leukemia Father    Ovarian cancer Sister    Diabetes Brother    Cancer Brother    Stroke Maternal Grandfather    Dementia Paternal Grandmother    COPD Neg Hx    Hypertension Neg Hx    Social History   Socioeconomic History   Marital status: Widowed    Spouse name: Not on file   Number of children: Not on file    Years of education: Not on file   Highest education level: Bachelor's degree (e.g., BA, AB, BS)  Occupational History   Not on file  Tobacco Use   Smoking status: Never   Smokeless tobacco: Never   Tobacco comments:    Never smoke 08/08/22  Vaping Use   Vaping Use: Never used  Substance and Sexual Activity   Alcohol use: Not Currently    Comment: none last 24 months   Drug use: No   Sexual activity: Not on file  Other Topics Concern   Not on file  Social History Narrative   Not on file   Social Determinants of Health   Financial Resource Strain: Low Risk  (12/10/2017)   Overall Financial Resource Strain (CARDIA)    Difficulty of Paying Living Expenses: Not hard at all  Food Insecurity: No Food Insecurity (09/01/2022)   Hunger Vital Sign    Worried About Running Out of Food in the Last Year: Never true    Ran Out of Food in the Last Year: Never true  Transportation Needs: No Transportation Needs (09/01/2022)   PRAPARE - Administrator, Civil Service (Medical): No    Lack of Transportation (Non-Medical):  No  Physical Activity: Inactive (12/10/2017)   Exercise Vital Sign    Days of Exercise per Week: 0 days    Minutes of Exercise per Session: 0 min  Stress: No Stress Concern Present (12/10/2017)   [employer] of Occupational Health - Occupational Stress Questionnaire    Feeling of Stress : Not at all  Social Connections: Socially Integrated (12/10/2017)   Social Connection and Isolation Panel [NHANES]    Frequency of Communication with Friends and Family: More than three times a week    Frequency of Social Gatherings with Friends and Family: More than three times a week    Attends Religious Services: More than 4 times per year    Active Member of Clubs or Organizations: Yes    Attends Engineer, structural: More than 4 times per year    Marital Status: Married   Past Surgical History:  Procedure Laterality Date   ABDOMINAL HYSTERECTOMY      APPENDECTOMY     CATARACT EXTRACTION     CERVICAL LAMINECTOMY  07/21/1985   CESAREAN SECTION     COLONOSCOPY  07/21/2012   diverticulosis, [facility] Dr. [name]    COLONOSCOPY WITH PROPOFOL N/A 02/04/2019   Procedure: COLONOSCOPY WITH PROPOFOL;  Surgeon: [name], MD;  Location: [facility] ENDOSCOPY;  Service: Endoscopy;  Laterality: N/A;   COLONOSCOPY WITH PROPOFOL N/A 03/18/2021   Procedure: COLONOSCOPY WITH PROPOFOL;  Surgeon: [name], MD;  Location: [facility] ENDOSCOPY;  Service: Endoscopy;  Laterality: N/A;  DM   DIAGNOSTIC LAPAROSCOPY     ESOPHAGOGASTRODUODENOSCOPY (EGD) WITH PROPOFOL N/A 02/04/2019   Procedure: ESOPHAGOGASTRODUODENOSCOPY (EGD) WITH PROPOFOL;  Surgeon: [name], MD;  Location: [facility] ENDOSCOPY;  Service: Endoscopy;  Laterality: N/A;   ESOPHAGOGASTRODUODENOSCOPY (EGD) WITH PROPOFOL N/A 03/18/2021   Procedure: ESOPHAGOGASTRODUODENOSCOPY (EGD) WITH PROPOFOL;  Surgeon: [name], MD;  Location: [facility] ENDOSCOPY;  Service: Endoscopy;  Laterality: N/A;   LOOP RECORDER INSERTION N/A 08/05/2021   Procedure: LOOP RECORDER INSERTION;  Surgeon: [name], MD;  Location: [facility] INVASIVE CV LAB;  Service: Cardiovascular;  Laterality: N/A;   SPINE SURGERY     TUBAL LIGATION     Past Medical History:  Diagnosis Date   Abnormal levels of other serum enzymes 07/17/2015   Arthritis    Constipation 07/11/2015   COVID-19 03/06/2021   Diabetes mellitus without complication    Elevated liver enzymes    Fatty liver    Generalized abdominal pain 07/11/2015   Hyperlipidemia    Hypertension    Lifelong obesity    Macular degeneration    Morbid obesity due to excess calories 07/11/2015   Other fatigue 07/11/2015   Sleep apnea    Spinal headache    BP 138/75   Pulse 76   Ht  (1.626 m)   Wt 184 lb 6.4 oz (83.6 kg)   SpO2 98%   BMI 31.65 kg/m   Opioid Risk Score:   Fall Risk Score:  `1  Depression screen [facility] 2/9     09/16/2021   11:25 AM  08/16/2020   10:15 AM 11/23/2019   10:25 AM 12/16/2018   10:47 AM 12/10/2017    1:09 PM 08/31/2017    9:33 AM 04/27/2017    3:44 PM  Depression screen PHQ 2/9  Decreased Interest 0 0 0 0 0 0 0  Down, Depressed, Hopeless 0 0 0 0 0 0 0  PHQ - 2 Score 0 0 0 0 0 0 0  Altered sleeping 0  Tired, decreased energy 3        Change in appetite 3        Feeling bad or failure about yourself  0        Trouble concentrating 0        Moving slowly or fidgety/restless 0        Suicidal thoughts 0        PHQ-9 Score 6          Review of Systems  Musculoskeletal:  Positive for back pain and gait problem.  Neurological:  Positive for weakness (left hand) and headaches.  All other systems reviewed and are negative.      Objective:   Physical Exam Gen: no distress, normal appearing, BMI 35.48, BP 135/62, weight 194 lbs, accompanied by son HEENT: oral mucosa pink and moist, NCAT Cardio: Reg rate Chest: normal effort, normal rate of breathing Abd: soft, non-distended Ext: no edema Psych: pleasant, normal affect, flat Skin: intact, no open lesions on feet, +onchomychosis of toe nails Neuro: Alert and oriented x3     Assessment & Plan:   1) CVA -continue therapies -would benefit from handicap placard to increase mobility in the community -reviewed all medications and provided necessary refills.  2) Diabetic peripheral neuropathy -Discussed Qutenza as an option for neuropathic pain control. Discussed that this is a capsaicin patch, stronger than capsaicin cream. Discussed that it is currently approved for diabetic peripheral neuropathy and post-herpetic neuralgia, but that it has also shown benefit in treating other forms of neuropathy. Provided patient with link to site to learn more about the patch: https://www.clark.biz/. Discussed that the patch would be placed in office and benefits usually last 3 months. Discussed that unintended exposure to capsaicin can cause severe irritation of  eyes, mucous membranes, respiratory tract, and skin, but that Qutenza is a local treatment and does not have the systemic side effects of other nerve medications. Discussed that there may be pain, itching, erythema, and decreased sensory function associated with the application of Qutenza. Side effects usually subside within 1 week. A cold pack of analgesic medications can help with these side effects. Blood pressure can also be increased due to pain associated with administration of the patch.   4 patches of Qutenza was applied to the area of pain. Ice packs were applied during the procedure to ensure patient comfort. Blood pressure was monitored every 15 minutes. The patient tolerated the procedure well. Post-procedure instructions were given and follow-up has been scheduled.    -decrease gabapentin to  HS  -Discussed improvement in HgbA1c to 5.4! -continue metformin -continue gabapentin -discussed that CBGs have been under 90s -discussed continuing to avoid rice and potatoes   3) Onchomycosis: mycostatin ordered.  4) HTN: -discussed stopping amlodipine if SBP consistently in 120s -Advised checking BP daily at home and logging results to bring into follow-up appointment with PCP and myself. -Reviewed BP meds today.  -Advised regarding healthy foods that can help lower blood pressure and provided with a list: 1) citrus foods- high in vitamins and minerals 2) salmon and other fatty fish - reduces inflammation and oxylipins 3) swiss chard (leafy green)- high level of nitrates 4) pumpkin seeds- one of the best natural sources of magnesium 5) Beans and lentils- high in fiber, magnesium, and potassium 6) Berries- high in flavonoids 7) Amaranth (whole grain, can be cooked similarly to rice and oats)- high in magnesium and fiber 8) Pistachios- even more effective at reducing BP than other nuts 9) Carrots- high  in phenolic compounds that relax blood vessels and reduce inflammation 10)  Celery- contain phthalides that relax tissues of arterial walls 11) Tomatoes- can also improve cholesterol and reduce risk of heart disease 12) Broccoli- good source of magnesium, calcium, and potassium 13) Greek yogurt: high in potassium and calcium 14) Herbs and spices: Celery seed, cilantro, saffron, lemongrass, black cumin, ginseng, cinnamon, cardamom, sweet basil, and ginger 15) Chia and flax seeds- also help to lower cholesterol and blood sugar 16) Beets- high levels of nitrates that relax blood vessels  17) spinach and bananas- high in potassium  -Provided lise of supplements that can help with hypertension:  1) magnesium: one high quality brand is Bioptemizers since it contains all 7 types of magnesium, otherwise over the counter magnesium gluconate 400mg  is a good option 2) B vitamins 3) vitamin D 4) potassium 5) CoQ10 6) L-arginine 7) Vitamin C 8) Beetroot -Educated that goal BP is 120/80. -Made goal to incorporate some of the above foods into diet.    40 minutes spent in discussion of risks and benefits of Qutenza and obtaining informed consent, discussion of q90 day follow-up and expectation of improvement in pain with each repeat application, discussed her most recent HgbA1c, provided list of foods for HTN and diabetes, encouraged avoiding rice and potatoes, commended on excellent CBG control, discussed decreasing gabapentin to 100mg  HS

## 2022-11-11 ENCOUNTER — Encounter: Payer: Self-pay | Admitting: Occupational Therapy

## 2022-11-11 ENCOUNTER — Ambulatory Visit: Payer: Medicare HMO | Admitting: Occupational Therapy

## 2022-11-11 ENCOUNTER — Ambulatory Visit: Payer: Medicare HMO | Admitting: Physical Therapy

## 2022-11-11 DIAGNOSIS — R278 Other lack of coordination: Secondary | ICD-10-CM

## 2022-11-11 DIAGNOSIS — R2681 Unsteadiness on feet: Secondary | ICD-10-CM

## 2022-11-11 DIAGNOSIS — M6281 Muscle weakness (generalized): Secondary | ICD-10-CM

## 2022-11-11 DIAGNOSIS — M25552 Pain in left hip: Secondary | ICD-10-CM

## 2022-11-11 DIAGNOSIS — R482 Apraxia: Secondary | ICD-10-CM

## 2022-11-11 DIAGNOSIS — M542 Cervicalgia: Secondary | ICD-10-CM

## 2022-11-11 NOTE — Therapy (Signed)
Occupational Therapy Neuro Treatment Note   Patient Name: Alyssa Mayo MRN: [id number] DOB:Jul 17, 1946, 77 y.o., female Today's Date: 03/14/2022   PCP: [name], MD REFERRING PROVIDER: [name], NP     OT End of Session - 11/11/22 1023     Visit Number 14    Number of Visits 24    Date for OT Re-Evaluation 12/09/22    OT Start Time 1020    OT Stop Time 1100    OT Time Calculation (min) 40 min    Activity Tolerance Patient tolerated treatment well    Behavior During Therapy [facility] for tasks assessed/performed                                    Past Medical History:  Diagnosis Date   Abnormal levels of other serum enzymes 07/17/2015   Arthritis     Constipation 07/11/2015   COVID-19 03/06/2021   Diabetes mellitus without complication (HCC)     Elevated liver enzymes     Fatty liver     Generalized abdominal pain 07/11/2015   Hyperlipidemia     Hypertension     Lifelong obesity     Macular degeneration     Morbid obesity due to excess calories (HCC) 07/11/2015   Other fatigue 07/11/2015   Sleep apnea     Spinal headache           Past Surgical History:  Procedure Laterality Date   ABDOMINAL HYSTERECTOMY       APPENDECTOMY       CATARACT EXTRACTION       CERVICAL LAMINECTOMY   07/21/1985   CESAREAN SECTION       COLONOSCOPY   07/21/2012    diverticulosis, [facility] Dr. [name]    COLONOSCOPY WITH PROPOFOL N/A 02/04/2019    Procedure: COLONOSCOPY WITH PROPOFOL;  Surgeon: [name], MD;  Location: [facility] ENDOSCOPY;  Service: Endoscopy;  Laterality: N/A;   COLONOSCOPY WITH PROPOFOL N/A 03/18/2021    Procedure: COLONOSCOPY WITH PROPOFOL;  Surgeon: [name], MD;  Location: [facility] ENDOSCOPY;  Service: Endoscopy;  Laterality: N/A;  DM   DIAGNOSTIC LAPAROSCOPY       ESOPHAGOGASTRODUODENOSCOPY (EGD) WITH PROPOFOL N/A 02/04/2019    Procedure: ESOPHAGOGASTRODUODENOSCOPY (EGD) WITH PROPOFOL;  Surgeon: [name], MD;  Location: [facility]  ENDOSCOPY;  Service: Endoscopy;  Laterality: N/A;   ESOPHAGOGASTRODUODENOSCOPY (EGD) WITH PROPOFOL N/A 03/18/2021    Procedure: ESOPHAGOGASTRODUODENOSCOPY (EGD) WITH PROPOFOL;  Surgeon: [name], MD;  Location: [facility] ENDOSCOPY;  Service: Endoscopy;  Laterality: N/A;   LOOP RECORDER INSERTION N/A 08/05/2021    Procedure: LOOP RECORDER INSERTION;  Surgeon: [name], MD;  Location: [facility] INVASIVE CV LAB;  Service: Cardiovascular;  Laterality: N/A;   SPINE SURGERY       TUBAL LIGATION            Patient Active Problem List    Diagnosis Date Noted   Paroxysmal atrial fibrillation (HCC) 08/08/2022   Hypercoagulable state due to paroxysmal atrial fibrillation (HCC) 08/08/2022   Cerebral edema (HCC) 08/07/2021   OSA (obstructive sleep apnea) 08/07/2021   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   CVA (cerebral vascular accident) (HCC) 08/01/2021   GERD (gastroesophageal reflux disease) 07/20/2019   Advanced care planning/counseling discussion 12/17/2016   Skin lesions, generalized 11/13/2016   Chronic right hip pain 06/17/2016   Vitamin D deficiency 09/26/2015   Diastasis recti 08/08/2015  Elevated serum GGT level 07/24/2015   Essential hypertension 07/17/2015   Fatty liver 07/17/2015   Elevated alkaline phosphatase level 07/17/2015   Elevated serum glutamic pyruvic transaminase (SGPT) level 07/17/2015   Abnormal finding on EKG 07/11/2015   Morbid obesity (HCC) 07/11/2015   Colon, diverticulosis 07/11/2015   Abdominal wall hernia 07/11/2015   DM (diabetes mellitus), type 2 with neurological complications (HCC)     Hyperlipidemia        ONSET DATE: 08/01/2021   REFERRING DIAG: CVA   THERAPY DIAG:  Muscle weakness (generalized)   Rationale for Evaluation and Treatment Rehabilitation   SUBJECTIVE:    SUBJECTIVE STATEMENT:    Pt. reports her arm hurt too much to go to her grandson's soccer game this weekend.  Pt accompanied by: self   PERTINENT HISTORY:   Pt. is a 77 y.o. female who had a unexpectedly hospitalized from 2/12-2/14/2024 for COVID-19, and a UTI.  Pt. 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 Pt. attended inpatient rehab from 08/07/2021-08/22/2021. Pt. received Home health therapy services this past spring.  Pt. has recently had an assessment through driver rehabilitation services at [facility] with recommendations for referrals for  outpatient OT/PT, and ST services. Pt. PMHx includes: HTN, Hyperlipidemia, Macular degeneration, DM, and Obesity.   PRECAUTIONS: None   WEIGHT BEARING RESTRICTIONS No   PAIN:  Are you having pain? none    FALLS: Has patient fallen in last 6 months? Yes.   LIVING ENVIRONMENT: Lives with: lives with their family  Son  [name] Lives in: House/apartment Main living are on one floor Stairs:  2 steps to enter Has following equipment at home: Single point cane, Wheelchair (manual), shower chair, Grab bars, bed rail, and rubber mat.   PLOF: Independent   PATIENT GOALS: To be able to Drive, and cook again   OBJECTIVE:    HAND DOMINANCE: Right    ADLs:  Transfers/ambulation related to ADLs: Independent Eating: Independent Grooming: [employer] Pt. reports being unable to use her left hand to perform haircare.  UB Dressing: Independent pullover shirt, independent now with fastening a bra LB Dressing: Independent donning pants socks, and slide on shoes Toileting: Independent Bathing: Independent Tub Shower transfers: Walk-in shower Independent now     IADLs: Shopping: Needs to be accompanied to the grocery store. Light housekeeping: Do  Meal Prep:  Is able to perform light meal prep Community mobility: Relies on son, and friend Medication management: Son sets up Mining engineer management:TBD Handwriting: TBD   MOBILITY STATUS: Hx of falls out of bed     ACTIVITY TOLERANCE: Activity tolerance:  10-20 min. Before rest break   FUNCTIONAL  OUTCOME MEASURES: FOTO: 61  TR score: 62  10/09/2022: FOTO: 49   UPPER EXTREMITY ROM      Active ROM Right eval Left eval Left  10/09/2022 Left 10/23/22  Shoulder flexion WFL 54(74) scaption 0(55) 26(80)  Shoulder abduction WFL 58(75) 32(54) 32(50)  Shoulder adduction        Shoulder extension        Shoulder internal rotation        Shoulder external rotation        Elbow flexion [facility] WFL 145 145  Elbow extension [facility] WFL 10(0) 10(0)  Wrist flexion WFL TBD [facility] WFL  Wrist extension WFL TBD [facility] WFL  Wrist ulnar deviation        Wrist radial deviation        Wrist pronation  Wrist supination        (Blank rows = not tested)     UPPER EXTREMITY MMT:      MMT Right eval Left eval Left 10/09/2022 Left 10/23/2022  Shoulder flexion 4+/5 2+/5 1/5 2-/5  Shoulder abduction 4+/5 2+/5 2-/5 2-/5  Shoulder adduction        Shoulder extension        Shoulder internal rotation        Shoulder external rotation        Middle trapezius        Lower trapezius        Elbow flexion 4+/5 3+/5 3/5 3/5  Elbow extension 4+/5 3+/5 3/5 3/5  Wrist flexion 4+/5 TBD 3/5 3/5  Wrist extension 4+/5 TBD 3/5 3/5  Wrist ulnar deviation        Wrist radial deviation        Wrist pronation        Wrist supination        (Blank rows = not tested)   HAND FUNCTION: Grip strength: Right: 33 lbs; Left: 11 lbs, Lateral pinch: Right: 17 lbs, Left: 2 lbs, and 3 point pinch: Right: 17 lbs, Left: 2 lbs  10/09/2022: Grip strength: Right: 33 lbs; Left: 10 lbs, Lateral pinch: Right: 17 lbs, Left: 1 lbs, and 3 point pinch: Right: 17 lbs, Left: 1 lbs   10/23/2022: Grip strength: Right: 33 lbs; Left: 16 lbs, Lateral pinch: Right: 17 lbs, Left: 1 lbs, and 3 point pinch: Right: 17 lbs, Left: 1 lbs   COORDINATION:   9 Hole Peg test: Right: 27 sec.  sec; Left: 52 sec.  10/09/2022: 9 Hole Peg test: Right: 27 sec.  sec; Left: 1 min. & 35 sec.   SENSATION: Light touch: WFL Proprioception: WFL    COGNITION: Overall cognitive status: Within functional limits for tasks assessed   VISION: Subjective report: Glasses. Just received a new prescription to increase bifocal strength Baseline vision: Macular Degeneration Visual history: macular degeneration   VISION ASSESSMENT: To be further assessed in functional context  Left sided Awareness      PERCEPTION: Limited left sided awareness     TODAY'S TREATMENT:    Therapeutic Activity: Pt worked on L hand [facility] skills and pinch strength grasping 1/4" connecting beads. Pt worked on storing the objects in the palm and translatory skills moving the items from the palm of the hand to the tip of the 2nd digit and thumb. Increased time to manipulate beads into lateral pinch on L hand and difficulty generating force to connect and disconnect beads, requires R hand assist. Pt placed 10 push pins into resistive board with cues to isolate 1st digit on top of pins to generate force.    Neuromuscular re-education: Pt. worked on flipping cards while alternating directions to incorporate thumb on fingers/fingers on  thumb motion in the development of fine motor coordination skills. Worked on progressively increasing the speed while maintaining control to sort deck by suit. Significantly improved control with slower sped vs cues to increase speed. Difficulty identifying if pt holding 1 vs 2 cards. Pt. worked on [facility] skills using the [employer] Task. Pt worked on sustaining grasp on the resistive tweezers while removing the sticks. Attempted to grasp the sticks and move them from a horizontal position to a vertical position to prepare for placing them into the pegboard - achieves rotation however unable to maintain grasp on tweezer to place into peg board.  PATIENT EDUCATION: Education details: Compensatory strategies during typing. Person educated: Patient Education method: verbal cues Education comprehension: verbalized understanding  and pt does not plan to cook unless son is present.     HOME EXERCISE PROGRAM:  Reviewed home activities to enhance left hand digit extension  during typing skills.   GOALS: Goals reviewed with patient? Yes     SHORT TERM GOALS: Target date: 10/28/2022     Pt. Will improve FOTO score by 2 points to reflect improved pt. perceived functional performance Baseline: 10/23/2022: FOTO 49 10/09/2022: FOTO 49, FOTO: 61 TR score: 62  Goal status: INITIAL   LONG TERM GOALS: Target date: 12/09/2022    Pt. Will improve left shoulder ROM by 10 degrees to be able able to efficiently apply deodorant Baseline: 10/23/2022: Shoulder flexion: 26(80), Abduction: 32(50) 10/09/2022: flexion: 0(55), Abduction: 32(54) Goal status: Improving, but very limited.   2.  Pt. will be able to independently hold and use a blow dryer, and brush for hair care. Baseline: 10/23/2022: Pt. Continues to be unable to hold a blow dryer. Eval: Pt. Is unable to sustain her BUEs in elevation, and use a blow dryer, and brush. 10/09/2022: Pt. Is unable to hold a blow dryer. Eval: Pt. Is unable to sustain her BUEs in elevation, and use a blow dryer, and brush. Goal status: Ongoing   3.  Pt. Will improve left lateral pinch strength by 3# to be able to independently cut meat. Baseline: 10/23/2022: Pt. Continues to present with difficulty cutting meat. 10/09/2022: 1#  Eval: 2#. Pt. has difficulty stabilizing utensil, and food while cutting food. Goal status: Ongoing   4.  Pt. Will improve Left hand [facility] skills to be able to be able to independently, and efficiently manipulate small objects during ADL tasks. Baseline 10/23/2022: Pt. Continues to present with difficulty manipulating small objects. 10/09/2022: Left: 1 min & 35 sec.: Eval: Left: 52 sec. Right: 27 sec. Goal status: Ongoing     6.: Pt. will improve with left grip strength to be able to hold and pull up covers. Baseline: 10/23/2022: Left: 16# 10/09/2022: Left: 10# Eval:  Left grip  strength 11#. Pt. has difficulty holding a full drink  securely with the left hand.             Goal status: Ongoing   7. Pt. Will improve typing speed, and accuracy in preparation for efficiently typing simple email message. Baseline: 10/23/2022: Pt. continues to present with limited speed, and accuracy with typing. 10/09/2022: Pt. presents with difficulty typing. Pt. presents with difficulty typing an email. Typing speed, and accuracy TBD             Goal status: INITIAL                                        CLINICAL IMPRESSION:  Manipulated cards, push pins, and connecting beads in L hand with increased time and rest breaks as hand fatigues. Completed Jamar Dexterity task, modified to remove sticks only 2/2 noted difficulty maintaining grasp while attempting to place into pegboard. Pt. continues to benefit from OT services to work towards improving L functioning in order to maximize engagement, and independence with these tasks.    PERFORMANCE DEFICITS in functional skills including ADLs, IADLs, coordination, dexterity, sensation, ROM, strength, FMC, decreased knowledge of use of DME, vision, and UE functional use, cognitive skills including attention, and psychosocial skills including  coping strategies, environmental adaptation, habits, and routines and behaviors.    IMPAIRMENTS are limiting patient from ADLs, IADLs, and social participation.    COMORBIDITIES may have co-morbidities  that affects occupational performance. Patient will benefit from skilled OT to address above impairments and improve overall function.   MODIFICATION OR ASSISTANCE TO COMPLETE EVALUATION: Min-Moderate modification of tasks or assist with assess necessary to complete an evaluation.   OT OCCUPATIONAL PROFILE AND HISTORY: Detailed assessment: Review of records and additional review of physical, cognitive, psychosocial history related to current functional performance.   CLINICAL DECISION MAKING: Moderate -  several treatment options, min-mod task modification necessary   REHAB POTENTIAL: Good   EVALUATION COMPLEXITY: Moderate      PLAN: OT FREQUENCY: 2x/week   OT DURATION: 12 weeks   PLANNED INTERVENTIONS: self care/ADL training, therapeutic exercise, therapeutic activity, neuromuscular re-education, manual therapy, passive range of motion, and paraffin   RECOMMENDED OTHER SERVICES: ST, and PT   CONSULTED AND AGREED WITH PLAN OF CARE: Patient   PLAN FOR NEXT SESSION:  L hand strengthening and coordination exercises    Kathie Dike, M.S. OTR/L  11/11/22, 10:24 AM  ascom [phone]

## 2022-11-11 NOTE — Therapy (Signed)
OUTPATIENT PHYSICAL THERAPY  TREATMENT   Patient Name: Avianah Pellman MRN: [id number] DOB:02/06/1946, 77 y.o., female Today's Date: 11/11/2022  END OF SESSION:   PT End of Session - 11/11/22 1104     Visit Number 14    Number of Visits 36    Date for PT Re-Evaluation 01/22/23    Authorization Type [employer]    Progress Note Due on Visit 20    PT Start Time 1102    PT Stop Time 1144    PT Time Calculation (min) 42 min    Equipment Utilized During Treatment Gait belt    Activity Tolerance Patient tolerated treatment well;No increased pain    Behavior During Therapy [facility] for tasks assessed/performed                  Past Medical History:  Diagnosis Date   Abnormal levels of other serum enzymes 07/17/2015   Arthritis    Constipation 07/11/2015   COVID-19 03/06/2021   Diabetes mellitus without complication    Elevated liver enzymes    Fatty liver    Generalized abdominal pain 07/11/2015   Hyperlipidemia    Hypertension    Lifelong obesity    Macular degeneration    Morbid obesity due to excess calories 07/11/2015   Other fatigue 07/11/2015   Sleep apnea    Spinal headache    Past Surgical History:  Procedure Laterality Date   ABDOMINAL HYSTERECTOMY     APPENDECTOMY     CATARACT EXTRACTION     CERVICAL LAMINECTOMY  07/21/1985   CESAREAN SECTION     COLONOSCOPY  07/21/2012   diverticulosis, [facility] Dr. [name]    COLONOSCOPY WITH PROPOFOL N/A 02/04/2019   Procedure: COLONOSCOPY WITH PROPOFOL;  Surgeon: [name], MD;  Location: [facility] ENDOSCOPY;  Service: Endoscopy;  Laterality: N/A;   COLONOSCOPY WITH PROPOFOL N/A 03/18/2021   Procedure: COLONOSCOPY WITH PROPOFOL;  Surgeon: [name], MD;  Location: [facility] ENDOSCOPY;  Service: Endoscopy;  Laterality: N/A;  DM   DIAGNOSTIC LAPAROSCOPY     ESOPHAGOGASTRODUODENOSCOPY (EGD) WITH PROPOFOL N/A 02/04/2019   Procedure: ESOPHAGOGASTRODUODENOSCOPY (EGD) WITH PROPOFOL;  Surgeon: [name],  MD;  Location: [facility] ENDOSCOPY;  Service: Endoscopy;  Laterality: N/A;   ESOPHAGOGASTRODUODENOSCOPY (EGD) WITH PROPOFOL N/A 03/18/2021   Procedure: ESOPHAGOGASTRODUODENOSCOPY (EGD) WITH PROPOFOL;  Surgeon: [name], MD;  Location: [facility] ENDOSCOPY;  Service: Endoscopy;  Laterality: N/A;   LOOP RECORDER INSERTION N/A 08/05/2021   Procedure: LOOP RECORDER INSERTION;  Surgeon: [name], MD;  Location: [facility] INVASIVE CV LAB;  Service: Cardiovascular;  Laterality: N/A;   SPINE SURGERY     TUBAL LIGATION     Patient Active Problem List   Diagnosis Date Noted   UTI (urinary tract infection) 09/01/2022   COVID-19 virus infection 09/01/2022   Chronic diastolic CHF (congestive heart failure) 09/01/2022   Chronic kidney disease, stage 3a 09/01/2022   Obesity (BMI 30-39.9) 09/01/2022   Paroxysmal atrial fibrillation 08/08/2022   Hypercoagulable state due to paroxysmal atrial fibrillation 08/08/2022   Cerebral edema 08/07/2021   OSA (obstructive sleep apnea) 08/07/2021   Right middle cerebral artery stroke 08/07/2021   CVA (cerebral vascular accident) 08/01/2021   GERD (gastroesophageal reflux disease) 07/20/2019   Advanced care planning/counseling discussion 12/17/2016   Skin lesions, generalized 11/13/2016   Chronic right hip pain 06/17/2016   Vitamin D deficiency 09/26/2015   Diastasis recti 08/08/2015   Elevated serum GGT level 07/24/2015   Essential hypertension 07/17/2015   Fatty  liver 07/17/2015   Elevated alkaline phosphatase level 07/17/2015   Elevated serum glutamic pyruvic transaminase (SGPT) level 07/17/2015   Abnormal finding on EKG 07/11/2015   Morbid obesity 07/11/2015   Colon, diverticulosis 07/11/2015   Abdominal wall hernia 07/11/2015   DM (diabetes mellitus), type 2 with neurological complications    Hyperlipidemia     PCP: [name], MD  REFERRING PROVIDER: [name], MD & Edilia Bo, [place]  REFERRING DIAG: [phone] (ICD-10-CM) - Cerebral  infarction due to unspecified occlusion or stenosis of unspecified precerebral arteries, Cervicalgia with degenerative disc  THERAPY DIAG:  Muscle weakness (generalized)  Other lack of coordination  Pain in left hip  Cervical pain  Unsteadiness on feet  Rationale for Evaluation and Treatment: Rehabilitation  ONSET DATE: 09/03/22  SUBJECTIVE:                                                                                                                                                                                                         SUBJECTIVE STATEMENT:  Pt reports overall no pain today. Mild LLE soreness was present after last session from strength training.    PERTINENT HISTORY:   Per chart and confirmed by pt: Pt is a 77 yo female with RMCA CVA infarction with hemorrhagic transformation 08/01/2021, with inpatient rehab 08/07/2021-08/22/2021 followed by [facility] therapy until June. Pt now currently being seen for outpatient OT and ST services. Pt reports limitations in stamina persist. Other PMH is significant for HTN, HLD, macular degeneration, DM, obesity, sleep apnea, arthritis, chronic R hip pain, vitamin D deficiency, diastasis recti, abdominal wall hernia, hx of abdominal hysterectomy, appendectomy, spine surgery (years ago).   PAIN:  Are you having pain? Yes: NPRS scale: 2/10 Pain location:  L hip/L knee  Pain description: dull ache Aggravating factors: sitting Relieving factors: laying down  PRECAUTIONS: None  WEIGHT BEARING RESTRICTIONS: No  FALLS:  Has patient fallen in last 6 months? Yes. Number of falls 2  LIVING ENVIRONMENT: Lives with: lives with their son Lives in: House/apartment Stairs: Yes: Internal: 13 steps; doesn't go up to the second floor and External: 4 steps; can reach both Has following equipment at home: Single point cane and Walker - 2 wheeled  OCCUPATION: Retired  PLOF: Independent with basic ADLs  PATIENT GOALS: "Get stronger. And reduce  fall risk. Reduce reliance on cane"  NEXT MD VISIT: end of May.   OBJECTIVE:   DIAGNOSTIC FINDINGS:    CT HEAD WITHOUT CONTRAST  CT CERVICAL SPINE WITHOUT CONTRAST IMPRESSION: 1. No acute intracranial pathology. 2. Expected interval  evolution of encephalomalacia of the right frontal lobe, following intraparenchymal hemorrhage seen acutely on examination dated 08/03/2021. 3. No fracture or static subluxation of the cervical spine. 4. Generally mild disc space height loss and osteophytosis throughout the cervical spine, with partial ankylosis of C5-C6 and a large left eccentric posterior bridging osteophyte at this level. MRI may be used to further evaluate cervical disc and neural foraminal pathology if indicated by neurologically localizing signs and symptoms.  EXAM: xRay PELVIS - 1-2 VIEW  IMPRESSION: 1. No convincing evidence of acute fracture or diastasis of the pelvis. 2. Well corticated osseous focus beneath the right inferior pubic ramus may reflect sequela of avulsion injury or chronic tendinopathy, suggest correlation with direct tenderness to palpation.   PATIENT SURVEYS:  NDI 15/50 = 30% disability FOTO 65/68   10/23/2022 : 52/68  10/23/2022 Foto CVA 57/59  COGNITION: Overall cognitive status: History of cognitive impairments - at baseline  SENSATION: WFL  POSTURE: rounded shoulders and forward head  PALPATION:     CERVICAL ROM:   Active ROM A/PROM (deg) eval 3/21 4/4  Flexion 39 25 39  Extension 10 24 25   Right lateral flexion 7 15 15   Left lateral flexion 6 20 14   Right rotation 49 53 41  Left rotation 30 42 35   (Blank rows = not tested)  UPPER EXTREMITY ROM:  Active ROM Right eval Left eval Right 4/4 Left 4/4  Shoulder flexion 128 81 149 22  Shoulder abduction 160 68  158  38   (Blank rows = not tested)   UPPER EXTREMITY MMT:  Grossly decreased (3+/5) in the L UE due to pain and lack of ROM   CERVICAL SPECIAL TESTS:   Spurling's test: Negative and Sharp pursor's test: Negative      LOWER EXTREMITY MMT:    MMT Right 3/21 Left 3/21  Hip flexion 4+ 4-  Hip extension 4+ 4-  Hip abduction 4+ 4  Hip adduction 4+ 4  Hip internal rotation    Hip external rotation    Knee flexion 4+ 4-  Knee extension 4+ 4  Ankle dorsiflexion 4+ 4  Ankle plantarflexion    Ankle inversion    Ankle eversion     (Blank rows = not tested)  LOWER EXTREMITY ROM:     Passive  Right 3/21 Left 3/21  Hip flexion 120 105  Hip extension    Hip abduction    Hip adduction    Hip internal rotation    Hip external rotation    Knee flexion [facility] WFL  Knee extension (in Hip flexion 90deg) 143 120  Ankle dorsiflexion    Ankle plantarflexion    Ankle inversion    Ankle eversion     (Blank rows = not tested)    LOWER EXTREMITY SPECIAL TESTS:  Hip special tests: Luisa Hart (FABER) test: positive , Hip scouring test: negative, and distraction: negative    FUNCTIONAL TESTS:  Not performed at this time.  TODAY'S TREATMENT: DATE: 11/11/22  Nustep BLE/BUE AAROM x 4 and then BLE only x 2 min level 1 throughout.   Standing therex:  On airex pad:  Forward, reach to 3 targets x 10 with RUE and then x 10 with LUE ot 2 target, AAROM to increase ROM.   Forward step up. x 8 Bil with UE support and x 5 bil with no UE support  Lateral step up x 8 bil with intermittent UE support.  Hip abduction with RTB x 10 bil  Hip  extension with RTB x 10 bil  Side stepping R and L no resistance x 3 and with RTB x 2 bil   SLS 3 x 10 sec bil with Bil intermittent UE support.  Tandem stance 3 x 10 sec bil with intermittent UE support  Therapeutic rest breaks throughout session with Moist heat applied to Cervical spine and UT for pain modulation   PATIENT EDUCATION:  Education details: Pt educated on role of PT and services provided during current POC, along with prognosis and information about the clinic. Need for Modified PT referral to  address new L hip and low back pain. Person educated: Patient and   Education method: Explanation Education comprehension: verbalized understanding  HOME EXERCISE PROGRAM:  Access Code: [email or web address] URL: [email or web address] Date: 09/18/2022 Prepared by: Tomasa Hose  Exercises - Seated Upper Trapezius Stretch  - 1 x daily - 7 x weekly - 3 sets - 10 reps - Seated Levator Scapulae Stretch  - 1 x daily - 7 x weekly - 3 sets - 10 reps - Seated Scapular Retraction  - 1 x daily - 7 x weekly - 3 sets - 10 reps  ASSESSMENT:  CLINICAL IMPRESSION: Continuing PT POC with focus on LLE strength and mobility. Pt able to tolerate high level standing therex today without Pain in the LLE. Pt would benefit from skilled PT to continue to improve cervical ROM as well as address impairments of the LLE for pain management and improved strength and function to improve QoL and reduce dependence on husband as caregiver.       OBJECTIVE IMPAIRMENTS: decreased activity tolerance, decreased cognition, decreased ROM, decreased strength, decreased safety awareness, hypomobility, and pain.   ACTIVITY LIMITATIONS: carrying, lifting, bending, sitting, standing, squatting, and reach over head  PARTICIPATION LIMITATIONS: meal prep, cleaning, laundry, driving, shopping, community activity, and yard work  PERSONAL FACTORS: Age, Behavior pattern, Past/current experiences, Time since onset of injury/illness/exacerbation, and 3+ comorbidities: arthritis, DM2, HTN, Obesity,   are also affecting patient's functional outcome.   REHAB POTENTIAL: Good  CLINICAL DECISION MAKING: Stable/uncomplicated  EVALUATION COMPLEXITY: Low   GOALS: Goals reviewed with patient? Yes  SHORT TERM GOALS: Target date: 12/11/2022    Pt will be independent with HEP in order to improve strength of BLE and ROM of the neck in order to return to PLOF. Baseline: provided on neck visit Goal status: INITIAL    LONG TERM  GOALS: Target date: 01/22/2023    Patient will demonstrate improved function as evidenced by a score of 68 on FOTO measure for full participation in activities at home and in the community. Baseline: 65. 10/23/22: 52 Goal status: IN PROGRESS  2.  Pt to improve NDI by 5 points or 10% in order to demonstrate a reduction in overall disability caused by neck pain at this time. Baseline: 15/50 or 30% disability 4/11 27/50  Goal status: NOT MET  3.  Pt to demonstrate improved cervical rotation to 60 deg bilaterally in order to perform tasks around the house that require turning of the head. Baseline: R/L Cervical Rotation: 49/30. 4/424 R:53. L:42 Goal status: IN PROGRESS  4.  Pt to report 0/10 pain in the neck following light household chores at home in order to demonstrate an overall reduction in pain and an improvement in QoL. Baseline: 4/10 at rest 0/10 in neck Goal status: MET  4.  Pt will improve gait speed to <0.53m/s to indicate improved community mobility and reduced fall risk  Goal status: New.  initial.  5. Pt will increase 6 min walk test >1072ft   PLAN:  PT FREQUENCY: 1-2x/week  PT DURATION: 12 weeks  PLANNED INTERVENTIONS: Therapeutic exercises, Therapeutic activity, Neuromuscular re-education, Balance training, Gait training, Patient/Family education, Self Care, Joint mobilization, Joint manipulation, Stair training, Vestibular training, Canalith repositioning, Visual/preceptual remediation/compensation, DME instructions, Dry Needling, Cognitive remediation, Electrical stimulation, Spinal manipulation, Spinal mobilization, Cryotherapy, Moist heat, Traction, Ultrasound, Manual therapy, and Re-evaluation    PLAN FOR NEXT SESSION:    Continue to address cervical ROM and strength deficits.  Continue to address L hip and low back pain. Increased strengthening and balance training.   Grier Rocher PT, DPT  Physical Therapist - [facility]   1:23 PM 11/11/22

## 2022-11-13 ENCOUNTER — Ambulatory Visit: Payer: Medicare HMO | Admitting: Physical Therapy

## 2022-11-13 ENCOUNTER — Ambulatory Visit: Payer: Medicare HMO | Admitting: Occupational Therapy

## 2022-11-13 DIAGNOSIS — M6281 Muscle weakness (generalized): Secondary | ICD-10-CM

## 2022-11-13 DIAGNOSIS — M25552 Pain in left hip: Secondary | ICD-10-CM

## 2022-11-13 DIAGNOSIS — M542 Cervicalgia: Secondary | ICD-10-CM

## 2022-11-13 DIAGNOSIS — R482 Apraxia: Secondary | ICD-10-CM

## 2022-11-13 DIAGNOSIS — R278 Other lack of coordination: Secondary | ICD-10-CM

## 2022-11-13 DIAGNOSIS — R262 Difficulty in walking, not elsewhere classified: Secondary | ICD-10-CM

## 2022-11-13 DIAGNOSIS — R2681 Unsteadiness on feet: Secondary | ICD-10-CM

## 2022-11-13 NOTE — Therapy (Signed)
OUTPATIENT PHYSICAL THERAPY  TREATMENT   Patient Name: Alyssa Mayo MRN: [id number] DOB:1945-10-30, 77 y.o., female Today's Date: 11/13/2022  END OF SESSION:   PT End of Session - 11/13/22 1103     Visit Number 15    Number of Visits 36    Date for PT Re-Evaluation 01/22/23    Authorization Type [employer]    Progress Note Due on Visit 20    PT Start Time 1104    PT Stop Time 1145    PT Time Calculation (min) 41 min    Equipment Utilized During Treatment Gait belt    Activity Tolerance Patient tolerated treatment well;No increased pain    Behavior During Therapy [facility] for tasks assessed/performed                  Past Medical History:  Diagnosis Date   Abnormal levels of other serum enzymes 07/17/2015   Arthritis    Constipation 07/11/2015   COVID-19 03/06/2021   Diabetes mellitus without complication    Elevated liver enzymes    Fatty liver    Generalized abdominal pain 07/11/2015   Hyperlipidemia    Hypertension    Lifelong obesity    Macular degeneration    Morbid obesity due to excess calories 07/11/2015   Other fatigue 07/11/2015   Sleep apnea    Spinal headache    Past Surgical History:  Procedure Laterality Date   ABDOMINAL HYSTERECTOMY     APPENDECTOMY     CATARACT EXTRACTION     CERVICAL LAMINECTOMY  07/21/1985   CESAREAN SECTION     COLONOSCOPY  07/21/2012   diverticulosis, [facility] Dr. [name]    COLONOSCOPY WITH PROPOFOL N/A 02/04/2019   Procedure: COLONOSCOPY WITH PROPOFOL;  Surgeon: [name], MD;  Location: [facility] ENDOSCOPY;  Service: Endoscopy;  Laterality: N/A;   COLONOSCOPY WITH PROPOFOL N/A 03/18/2021   Procedure: COLONOSCOPY WITH PROPOFOL;  Surgeon: [name], MD;  Location: [facility] ENDOSCOPY;  Service: Endoscopy;  Laterality: N/A;  DM   DIAGNOSTIC LAPAROSCOPY     ESOPHAGOGASTRODUODENOSCOPY (EGD) WITH PROPOFOL N/A 02/04/2019   Procedure: ESOPHAGOGASTRODUODENOSCOPY (EGD) WITH PROPOFOL;  Surgeon: [name],  MD;  Location: [facility] ENDOSCOPY;  Service: Endoscopy;  Laterality: N/A;   ESOPHAGOGASTRODUODENOSCOPY (EGD) WITH PROPOFOL N/A 03/18/2021   Procedure: ESOPHAGOGASTRODUODENOSCOPY (EGD) WITH PROPOFOL;  Surgeon: [name], MD;  Location: [facility] ENDOSCOPY;  Service: Endoscopy;  Laterality: N/A;   LOOP RECORDER INSERTION N/A 08/05/2021   Procedure: LOOP RECORDER INSERTION;  Surgeon: [name], MD;  Location: [facility] INVASIVE CV LAB;  Service: Cardiovascular;  Laterality: N/A;   SPINE SURGERY     TUBAL LIGATION     Patient Active Problem List   Diagnosis Date Noted   UTI (urinary tract infection) 09/01/2022   COVID-19 virus infection 09/01/2022   Chronic diastolic CHF (congestive heart failure) 09/01/2022   Chronic kidney disease, stage 3a 09/01/2022   Obesity (BMI 30-39.9) 09/01/2022   Paroxysmal atrial fibrillation 08/08/2022   Hypercoagulable state due to paroxysmal atrial fibrillation 08/08/2022   Cerebral edema 08/07/2021   OSA (obstructive sleep apnea) 08/07/2021   Right middle cerebral artery stroke 08/07/2021   CVA (cerebral vascular accident) 08/01/2021   GERD (gastroesophageal reflux disease) 07/20/2019   Advanced care planning/counseling discussion 12/17/2016   Skin lesions, generalized 11/13/2016   Chronic right hip pain 06/17/2016   Vitamin D deficiency 09/26/2015   Diastasis recti 08/08/2015   Elevated serum GGT level 07/24/2015   Essential hypertension 07/17/2015   Fatty  liver 07/17/2015   Elevated alkaline phosphatase level 07/17/2015   Elevated serum glutamic pyruvic transaminase (SGPT) level 07/17/2015   Abnormal finding on EKG 07/11/2015   Morbid obesity 07/11/2015   Colon, diverticulosis 07/11/2015   Abdominal wall hernia 07/11/2015   DM (diabetes mellitus), type 2 with neurological complications    Hyperlipidemia     PCP: [name], MD  REFERRING PROVIDER: [name], MD & Edilia Bo, [place]  REFERRING DIAG: [phone] (ICD-10-CM) - Cerebral  infarction due to unspecified occlusion or stenosis of unspecified precerebral arteries, Cervicalgia with degenerative disc  THERAPY DIAG:  Muscle weakness (generalized)  Pain in left hip  Other lack of coordination  Cervical pain  Unsteadiness on feet  Apraxia  Difficulty in walking, not elsewhere classified  Painful cervical ROM  Rationale for Evaluation and Treatment: Rehabilitation  ONSET DATE: 09/03/22  SUBJECTIVE:                                                                                                                                                                                                         SUBJECTIVE STATEMENT:  Pt reports that she did not sleep well last night in her recliner. Dropped remote to control chair, and was unable to reposition until SO came into living room the next morning.    PERTINENT HISTORY:   Per chart and confirmed by pt: Pt is a 77 yo female with RMCA CVA infarction with hemorrhagic transformation 08/01/2021, with inpatient rehab 08/07/2021-08/22/2021 followed by [facility] therapy until June. Pt now currently being seen for outpatient OT and ST services. Pt reports limitations in stamina persist. Other PMH is significant for HTN, HLD, macular degeneration, DM, obesity, sleep apnea, arthritis, chronic R hip pain, vitamin D deficiency, diastasis recti, abdominal wall hernia, hx of abdominal hysterectomy, appendectomy, spine surgery (years ago).   PAIN:  Are you having pain? Yes: NPRS scale: 2/10 Pain location:  L hip/L knee  Pain description: dull ache Aggravating factors: sitting Relieving factors: laying down  PRECAUTIONS: None  WEIGHT BEARING RESTRICTIONS: No  FALLS:  Has patient fallen in last 6 months? Yes. Number of falls 2  LIVING ENVIRONMENT: Lives with: lives with their son Lives in: House/apartment Stairs: Yes: Internal: 13 steps; doesn't go up to the second floor and External: 4 steps; can reach both Has following  equipment at home: Single point cane and Walker - 2 wheeled  OCCUPATION: Retired  PLOF: Independent with basic ADLs  PATIENT GOALS: "Get stronger. And reduce fall risk. Reduce reliance on cane"  NEXT MD VISIT: end of May.  OBJECTIVE:   DIAGNOSTIC FINDINGS:    CT HEAD WITHOUT CONTRAST  CT CERVICAL SPINE WITHOUT CONTRAST IMPRESSION: 1. No acute intracranial pathology. 2. Expected interval evolution of encephalomalacia of the right frontal lobe, following intraparenchymal hemorrhage seen acutely on examination dated 08/03/2021. 3. No fracture or static subluxation of the cervical spine. 4. Generally mild disc space height loss and osteophytosis throughout the cervical spine, with partial ankylosis of C5-C6 and a large left eccentric posterior bridging osteophyte at this level. MRI may be used to further evaluate cervical disc and neural foraminal pathology if indicated by neurologically localizing signs and symptoms.  EXAM: xRay PELVIS - 1-2 VIEW  IMPRESSION: 1. No convincing evidence of acute fracture or diastasis of the pelvis. 2. Well corticated osseous focus beneath the right inferior pubic ramus may reflect sequela of avulsion injury or chronic tendinopathy, suggest correlation with direct tenderness to palpation.   PATIENT SURVEYS:  NDI 15/50 = 30% disability FOTO 65/68   10/23/2022 : 52/68  10/23/2022 Foto CVA 57/59  COGNITION: Overall cognitive status: History of cognitive impairments - at baseline  SENSATION: WFL  POSTURE: rounded shoulders and forward head  PALPATION:     CERVICAL ROM:   Active ROM A/PROM (deg) eval 3/21 4/4  Flexion 39 25 39  Extension 10 24 25   Right lateral flexion 7 15 15   Left lateral flexion 6 20 14   Right rotation 49 53 41  Left rotation 30 42 35   (Blank rows = not tested)  UPPER EXTREMITY ROM:  Active ROM Right eval Left eval Right 4/4 Left 4/4  Shoulder flexion 128 81 149 22  Shoulder abduction 160 68  158   38   (Blank rows = not tested)   UPPER EXTREMITY MMT:  Grossly decreased (3+/5) in the L UE due to pain and lack of ROM   CERVICAL SPECIAL TESTS:  Spurling's test: Negative and Sharp pursor's test: Negative      LOWER EXTREMITY MMT:    MMT Right 3/21 Left 3/21  Hip flexion 4+ 4-  Hip extension 4+ 4-  Hip abduction 4+ 4  Hip adduction 4+ 4  Hip internal rotation    Hip external rotation    Knee flexion 4+ 4-  Knee extension 4+ 4  Ankle dorsiflexion 4+ 4  Ankle plantarflexion    Ankle inversion    Ankle eversion     (Blank rows = not tested)  LOWER EXTREMITY ROM:     Passive  Right 3/21 Left 3/21  Hip flexion 120 105  Hip extension    Hip abduction    Hip adduction    Hip internal rotation    Hip external rotation    Knee flexion [facility] WFL  Knee extension (in Hip flexion 90deg) 143 120  Ankle dorsiflexion    Ankle plantarflexion    Ankle inversion    Ankle eversion     (Blank rows = not tested)    LOWER EXTREMITY SPECIAL TESTS:  Hip special tests: Luisa Hart (FABER) test: positive , Hip scouring test: negative, and distraction: negative    FUNCTIONAL TESTS:  Not performed at this time.  TODAY'S TREATMENT: DATE: 11/13/22  Sit>supine with supervision assist.  Supine therex; SLR x 10 SAQ x 10 Bridge x8   Hip abduction x 10  Heel slide x 10    Supine manual therapy for LLE  Longitudinal distraction 2 x 45 sec  PROM hip/knee flexion 2 x 45sec Figure 4 x 1 min  Glute stretch x   Standing therex.  RTB hip abduction x 10  Side stepping R and L 44ft x 2 with RTB   AROM hip flexion x 12 bil  Calf raise toe raise x 15 BIL  CGA to return to sitting EOB due to pain In the L shoulder.     PATIENT EDUCATION:  Education details: Pt educated throughout session about proper posture and technique with exercises. Improved exercise technique, movement at target joints, use of target muscles after min to mod verbal, visual, tactile cues.  Person  educated: Patient and   Education method: Explanation Education comprehension: verbalized understanding  HOME EXERCISE PROGRAM:  Access Code: [email or web address] URL: [email or web address] Date: 09/18/2022 Prepared by: Tomasa Hose  Exercises - Seated Upper Trapezius Stretch  - 1 x daily - 7 x weekly - 3 sets - 10 reps - Seated Levator Scapulae Stretch  - 1 x daily - 7 x weekly - 3 sets - 10 reps - Seated Scapular Retraction  - 1 x daily - 7 x weekly - 3 sets - 10 reps  ASSESSMENT:  CLINICAL IMPRESSION: Continuing PT POC with focus on LLE strength and mobility.  No pain in cervical spine on this day. Pt demonstrated improved tolerance with dynamic strengthening on ROM in the LLE.  Pt would benefit from skilled PT to continue to improve cervical ROM as well as address impairments of the LLE for pain management and improved strength and function to improve QoL and reduce dependence on husband as caregiver.       OBJECTIVE IMPAIRMENTS: decreased activity tolerance, decreased cognition, decreased ROM, decreased strength, decreased safety awareness, hypomobility, and pain.   ACTIVITY LIMITATIONS: carrying, lifting, bending, sitting, standing, squatting, and reach over head  PARTICIPATION LIMITATIONS: meal prep, cleaning, laundry, driving, shopping, community activity, and yard work  PERSONAL FACTORS: Age, Behavior pattern, Past/current experiences, Time since onset of injury/illness/exacerbation, and 3+ comorbidities: arthritis, DM2, HTN, Obesity,   are also affecting patient's functional outcome.   REHAB POTENTIAL: Good  CLINICAL DECISION MAKING: Stable/uncomplicated  EVALUATION COMPLEXITY: Low   GOALS: Goals reviewed with patient? Yes  SHORT TERM GOALS: Target date: 12/11/2022    Pt will be independent with HEP in order to improve strength of BLE and ROM of the neck in order to return to PLOF. Baseline: provided on neck visit Goal status: INITIAL    LONG TERM  GOALS: Target date: 01/22/2023    Patient will demonstrate improved function as evidenced by a score of 68 on FOTO measure for full participation in activities at home and in the community. Baseline: 65. 10/23/22: 52 Goal status: IN PROGRESS  2.  Pt to improve NDI by 5 points or 10% in order to demonstrate a reduction in overall disability caused by neck pain at this time. Baseline: 15/50 or 30% disability 4/11 27/50  Goal status: NOT MET  3.  Pt to demonstrate improved cervical rotation to 60 deg bilaterally in order to perform tasks around the house that require turning of the head. Baseline: R/L Cervical Rotation: 49/30. 4/424 R:53. L:42 Goal status: IN PROGRESS  4.  Pt to report 0/10 pain in the neck following light household chores at home in order to demonstrate an overall reduction in pain and an improvement in QoL. Baseline: 4/10 at rest 0/10 in neck Goal status: MET  4.  Pt will improve gait speed to <0.66m/s to indicate improved community mobility and reduced fall risk  Goal status: New.  initial.   5. Pt will increase 6 min walk test >  1048ft   PLAN:  PT FREQUENCY: 1-2x/week  PT DURATION: 12 weeks  PLANNED INTERVENTIONS: Therapeutic exercises, Therapeutic activity, Neuromuscular re-education, Balance training, Gait training, Patient/Family education, Self Care, Joint mobilization, Joint manipulation, Stair training, Vestibular training, Canalith repositioning, Visual/preceptual remediation/compensation, DME instructions, Dry Needling, Cognitive remediation, Electrical stimulation, Spinal manipulation, Spinal mobilization, Cryotherapy, Moist heat, Traction, Ultrasound, Manual therapy, and Re-evaluation    PLAN FOR NEXT SESSION:    Continue to address cervical ROM and strength deficits.  Continue to address L hip and low back pain. Increased strengthening and balance training.   Grier Rocher PT, DPT  Physical Therapist - [facility]   5:15 PM 11/13/22

## 2022-11-13 NOTE — Therapy (Signed)
Occupational Therapy Neuro Treatment Note   Patient Name: Alyssa Mayo MRN: [id number] DOB:10/08/1945, 77 y.o., female Today's Date: 03/14/2022   PCP: [name], MD REFERRING PROVIDER: [name], NP     OT End of Session - 11/13/22 1054     Visit Number 15    Number of Visits 24    Date for OT Re-Evaluation 12/09/22    OT Start Time 1015    OT Stop Time 1100    OT Time Calculation (min) 45 min    Activity Tolerance Patient tolerated treatment well    Behavior During Therapy [facility] for tasks assessed/performed                                    Past Medical History:  Diagnosis Date   Abnormal levels of other serum enzymes 07/17/2015   Arthritis     Constipation 07/11/2015   COVID-19 03/06/2021   Diabetes mellitus without complication (HCC)     Elevated liver enzymes     Fatty liver     Generalized abdominal pain 07/11/2015   Hyperlipidemia     Hypertension     Lifelong obesity     Macular degeneration     Morbid obesity due to excess calories (HCC) 07/11/2015   Other fatigue 07/11/2015   Sleep apnea     Spinal headache           Past Surgical History:  Procedure Laterality Date   ABDOMINAL HYSTERECTOMY       APPENDECTOMY       CATARACT EXTRACTION       CERVICAL LAMINECTOMY   07/21/1985   CESAREAN SECTION       COLONOSCOPY   07/21/2012    diverticulosis, [facility] Dr. [name]    COLONOSCOPY WITH PROPOFOL N/A 02/04/2019    Procedure: COLONOSCOPY WITH PROPOFOL;  Surgeon: [name], MD;  Location: [facility] ENDOSCOPY;  Service: Endoscopy;  Laterality: N/A;   COLONOSCOPY WITH PROPOFOL N/A 03/18/2021    Procedure: COLONOSCOPY WITH PROPOFOL;  Surgeon: [name], MD;  Location: [facility] ENDOSCOPY;  Service: Endoscopy;  Laterality: N/A;  DM   DIAGNOSTIC LAPAROSCOPY       ESOPHAGOGASTRODUODENOSCOPY (EGD) WITH PROPOFOL N/A 02/04/2019    Procedure: ESOPHAGOGASTRODUODENOSCOPY (EGD) WITH PROPOFOL;  Surgeon: [name], MD;  Location: [facility]  ENDOSCOPY;  Service: Endoscopy;  Laterality: N/A;   ESOPHAGOGASTRODUODENOSCOPY (EGD) WITH PROPOFOL N/A 03/18/2021    Procedure: ESOPHAGOGASTRODUODENOSCOPY (EGD) WITH PROPOFOL;  Surgeon: [name], MD;  Location: [facility] ENDOSCOPY;  Service: Endoscopy;  Laterality: N/A;   LOOP RECORDER INSERTION N/A 08/05/2021    Procedure: LOOP RECORDER INSERTION;  Surgeon: [name], MD;  Location: [facility] INVASIVE CV LAB;  Service: Cardiovascular;  Laterality: N/A;   SPINE SURGERY       TUBAL LIGATION            Patient Active Problem List    Diagnosis Date Noted   Paroxysmal atrial fibrillation (HCC) 08/08/2022   Hypercoagulable state due to paroxysmal atrial fibrillation (HCC) 08/08/2022   Cerebral edema (HCC) 08/07/2021   OSA (obstructive sleep apnea) 08/07/2021   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   CVA (cerebral vascular accident) (HCC) 08/01/2021   GERD (gastroesophageal reflux disease) 07/20/2019   Advanced care planning/counseling discussion 12/17/2016   Skin lesions, generalized 11/13/2016   Chronic right hip pain 06/17/2016   Vitamin D deficiency 09/26/2015   Diastasis recti 08/08/2015  Elevated serum GGT level 07/24/2015   Essential hypertension 07/17/2015   Fatty liver 07/17/2015   Elevated alkaline phosphatase level 07/17/2015   Elevated serum glutamic pyruvic transaminase (SGPT) level 07/17/2015   Abnormal finding on EKG 07/11/2015   Morbid obesity (HCC) 07/11/2015   Colon, diverticulosis 07/11/2015   Abdominal wall hernia 07/11/2015   DM (diabetes mellitus), type 2 with neurological complications (HCC)     Hyperlipidemia        ONSET DATE: 08/01/2021   REFERRING DIAG: CVA   THERAPY DIAG:  Muscle weakness (generalized)   Rationale for Evaluation and Treatment Rehabilitation   SUBJECTIVE:    SUBJECTIVE STATEMENT:    Pt. reports that she sees Dr. Burnett Sheng at the end of May. Pt. reports that she may try to get in to see [facility] in Orthopedics. Pt  accompanied by: self   PERTINENT HISTORY:  Pt. is a 77 y.o. female who had a unexpectedly hospitalized from 2/12-2/14/2024 for COVID-19, and a UTI.  Pt. 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 Pt. attended inpatient rehab from 08/07/2021-08/22/2021. Pt. received Home health therapy services this past spring.  Pt. has recently had an assessment through driver rehabilitation services at [facility] with recommendations for referrals for  outpatient OT/PT, and ST services. Pt. PMHx includes: HTN, Hyperlipidemia, Macular degeneration, DM, and Obesity.   PRECAUTIONS: None   WEIGHT BEARING RESTRICTIONS No   PAIN:  Are you having pain? none   FALLS: Has patient fallen in last 6 months? Yes.   LIVING ENVIRONMENT: Lives with: lives with their family  Son  [name] Lives in: House/apartment Main living are on one floor Stairs:  2 steps to enter Has following equipment at home: Single point cane, Wheelchair (manual), shower chair, Grab bars, bed rail, and rubber mat.   PLOF: Independent   PATIENT GOALS: To be able to Drive, and cook again   OBJECTIVE:    HAND DOMINANCE: Right    ADLs:  Transfers/ambulation related to ADLs: Independent Eating: Independent Grooming: [employer] Pt. reports being unable to use her left hand to perform haircare.  UB Dressing: Independent pullover shirt, independent now with fastening a bra LB Dressing: Independent donning pants socks, and slide on shoes Toileting: Independent Bathing: Independent Tub Shower transfers: Walk-in shower Independent now     IADLs: Shopping: Needs to be accompanied to the grocery store. Light housekeeping: Do  Meal Prep:  Is able to perform light meal prep Community mobility: Relies on son, and friend Medication management: Son sets up Mining engineer management:TBD Handwriting: TBD   MOBILITY STATUS: Hx of falls out of bed     ACTIVITY TOLERANCE: Activity tolerance:  10-20  min. Before rest break   FUNCTIONAL OUTCOME MEASURES: FOTO: 61  TR score: 62  10/09/2022: FOTO: 49   UPPER EXTREMITY ROM      Active ROM Right eval Left eval Left  10/09/2022 Left 10/23/22  Shoulder flexion WFL 54(74) scaption 0(55) 26(80)  Shoulder abduction WFL 58(75) 32(54) 32(50)  Shoulder adduction        Shoulder extension        Shoulder internal rotation        Shoulder external rotation        Elbow flexion [facility] WFL 145 145  Elbow extension [facility] WFL 10(0) 10(0)  Wrist flexion WFL TBD [facility] WFL  Wrist extension WFL TBD [facility] WFL  Wrist ulnar deviation        Wrist radial deviation  Wrist pronation        Wrist supination        (Blank rows = not tested)     UPPER EXTREMITY MMT:      MMT Right eval Left eval Left 10/09/2022 Left 10/23/2022  Shoulder flexion 4+/5 2+/5 1/5 2-/5  Shoulder abduction 4+/5 2+/5 2-/5 2-/5  Shoulder adduction        Shoulder extension        Shoulder internal rotation        Shoulder external rotation        Middle trapezius        Lower trapezius        Elbow flexion 4+/5 3+/5 3/5 3/5  Elbow extension 4+/5 3+/5 3/5 3/5  Wrist flexion 4+/5 TBD 3/5 3/5  Wrist extension 4+/5 TBD 3/5 3/5  Wrist ulnar deviation        Wrist radial deviation        Wrist pronation        Wrist supination        (Blank rows = not tested)   HAND FUNCTION: Grip strength: Right: 33 lbs; Left: 11 lbs, Lateral pinch: Right: 17 lbs, Left: 2 lbs, and 3 point pinch: Right: 17 lbs, Left: 2 lbs  10/09/2022: Grip strength: Right: 33 lbs; Left: 10 lbs, Lateral pinch: Right: 17 lbs, Left: 1 lbs, and 3 point pinch: Right: 17 lbs, Left: 1 lbs   10/23/2022: Grip strength: Right: 33 lbs; Left: 16 lbs, Lateral pinch: Right: 17 lbs, Left: 1 lbs, and 3 point pinch: Right: 17 lbs, Left: 1 lbs   COORDINATION:   9 Hole Peg test: Right: 27 sec.  sec; Left: 52 sec.  10/09/2022: 9 Hole Peg test: Right: 27 sec.  sec; Left: 1 min. & 35 sec.   SENSATION: Light touch:  WFL Proprioception: WFL   COGNITION: Overall cognitive status: Within functional limits for tasks assessed   VISION: Subjective report: Glasses. Just received a new prescription to increase bifocal strength Baseline vision: Macular Degeneration Visual history: macular degeneration   VISION ASSESSMENT: To be further assessed in functional context  Left sided Awareness      PERCEPTION: Limited left sided awareness     TODAY'S TREATMENT:    Therapeutic Exercise:   Pt. worked on BUE strengthening, and reciprocal motion using the UBE while seated for 8 min. with no resistance initially, and the resistance gradually increasing. Constant monitoring was provided. Pt. worked on [employer] for left shoulder flexion on an incline at the tabletop.Pt. performed 1# dowel ex. For UE strengthening secondary to weakness. Bilateral shoulder flexion, and chest press. Pt. Worked on 2# dumbbell ex. for elbow flexion and extension, forearm supination/pronation, wrist flexion/extension, and radial deviation. Pt. requires rest breaks and verbal cues for proper technique.  Neuromuscular re-education:  Pt. worked on left hand [facility] skills grasping 1" sticks from a horizontal position in a shallow dish on the [employer], and placing them vertically in the pegboard.      PATIENT EDUCATION: Education details: Compensatory strategies during typing. Person educated: Patient Education method: verbal cues Education comprehension: verbalized understanding and pt does not plan to cook unless son is present.     HOME EXERCISE PROGRAM:  Reviewed home activities to enhance left hand digit extension  during typing skills.   GOALS: Goals reviewed with patient? Yes     SHORT TERM GOALS: Target date: 10/28/2022     Pt. Will improve FOTO score by 2 points to reflect improved pt. perceived  functional performance Baseline: 10/23/2022: FOTO 49 10/09/2022: FOTO 49, FOTO: 61 TR score: 62  Goal status: INITIAL   LONG  TERM GOALS: Target date: 12/09/2022    Pt. Will improve left shoulder ROM by 10 degrees to be able able to efficiently apply deodorant Baseline: 10/23/2022: Shoulder flexion: 26(80), Abduction: 32(50) 10/09/2022: flexion: 0(55), Abduction: 32(54) Goal status: Improving, but very limited.   2.  Pt. will be able to independently hold and use a blow dryer, and brush for hair care. Baseline: 10/23/2022: Pt. Continues to be unable to hold a blow dryer. Eval: Pt. Is unable to sustain her BUEs in elevation, and use a blow dryer, and brush. 10/09/2022: Pt. Is unable to hold a blow dryer. Eval: Pt. Is unable to sustain her BUEs in elevation, and use a blow dryer, and brush. Goal status: Ongoing   3.  Pt. Will improve left lateral pinch strength by 3# to be able to independently cut meat. Baseline: 10/23/2022: Pt. Continues to present with difficulty cutting meat. 10/09/2022: 1#  Eval: 2#. Pt. has difficulty stabilizing utensil, and food while cutting food. Goal status: Ongoing   4.  Pt. Will improve Left hand [facility] skills to be able to be able to independently, and efficiently manipulate small objects during ADL tasks. Baseline 10/23/2022: Pt. Continues to present with difficulty manipulating small objects. 10/09/2022: Left: 1 min & 35 sec.: Eval: Left: 52 sec. Right: 27 sec. Goal status: Ongoing     6.: Pt. will improve with left grip strength to be able to hold and pull up covers. Baseline: 10/23/2022: Left: 16# 10/09/2022: Left: 10# Eval:  Left grip strength 11#. Pt. has difficulty holding a full drink  securely with the left hand.             Goal status: Ongoing   7. Pt. Will improve typing speed, and accuracy in preparation for efficiently typing simple email message. Baseline: 10/23/2022: Pt. continues to present with limited speed, and accuracy with typing. 10/09/2022: Pt. presents with difficulty typing. Pt. presents with difficulty typing an email. Typing speed, and accuracy TBD             Goal status:  INITIAL                                        CLINICAL IMPRESSION:  Pt. reports no left shoulder pain, however presents with limited ROM. Pt. tolerated the UBE well this morning, and was able to tolerate the addition of minimal resistance. Pt. required cues, and assist for hand position, form, and technique during the dumbbell exercises. Pt. continues to benefit from OT services to work towards improving Left UE functioning in order to maximize engagement, and overall independence with ADLs, and IADL tasks.     PERFORMANCE DEFICITS in functional skills including ADLs, IADLs, coordination, dexterity, sensation, ROM, strength, FMC, decreased knowledge of use of DME, vision, and UE functional use, cognitive skills including attention, and psychosocial skills including coping strategies, environmental adaptation, habits, and routines and behaviors.    IMPAIRMENTS are limiting patient from ADLs, IADLs, and social participation.    COMORBIDITIES may have co-morbidities  that affects occupational performance. Patient will benefit from skilled OT to address above impairments and improve overall function.   MODIFICATION OR ASSISTANCE TO COMPLETE EVALUATION: Min-Moderate modification of tasks or assist with assess necessary to complete an evaluation.   OT OCCUPATIONAL PROFILE AND HISTORY: Detailed  assessment: Review of records and additional review of physical, cognitive, psychosocial history related to current functional performance.   CLINICAL DECISION MAKING: Moderate - several treatment options, min-mod task modification necessary   REHAB POTENTIAL: Good   EVALUATION COMPLEXITY: Moderate      PLAN: OT FREQUENCY: 2x/week   OT DURATION: 12 weeks   PLANNED INTERVENTIONS: self care/ADL training, therapeutic exercise, therapeutic activity, neuromuscular re-education, manual therapy, passive range of motion, and paraffin   RECOMMENDED OTHER SERVICES: ST, and PT   CONSULTED AND AGREED WITH  PLAN OF CARE: Patient   PLAN FOR NEXT SESSION:  L hand strengthening and coordination exercises    Olegario Messier, MS, OTR/L  11/13/22, 10:57 AM

## 2022-11-18 ENCOUNTER — Ambulatory Visit: Payer: Medicare HMO | Admitting: Occupational Therapy

## 2022-11-18 ENCOUNTER — Ambulatory Visit: Payer: Medicare HMO | Admitting: Physical Therapy

## 2022-11-18 DIAGNOSIS — R278 Other lack of coordination: Secondary | ICD-10-CM

## 2022-11-18 DIAGNOSIS — M6281 Muscle weakness (generalized): Secondary | ICD-10-CM

## 2022-11-18 DIAGNOSIS — R2681 Unsteadiness on feet: Secondary | ICD-10-CM

## 2022-11-18 DIAGNOSIS — M542 Cervicalgia: Secondary | ICD-10-CM

## 2022-11-18 DIAGNOSIS — M25552 Pain in left hip: Secondary | ICD-10-CM

## 2022-11-18 NOTE — Therapy (Signed)
Occupational Therapy Neuro Treatment Note   Patient Name: Alyssa Mayo MRN: [id number] DOB:07-28-1945, 77 y.o., female Today's Date: 03/14/2022   PCP: [name], MD REFERRING PROVIDER: [name], NP     OT End of Session - 11/18/22 1048     Visit Number 16    Number of Visits 24    Date for OT Re-Evaluation 12/09/22    OT Start Time 1018    OT Stop Time 1100    OT Time Calculation (min) 42 min    Activity Tolerance Patient tolerated treatment well    Behavior During Therapy [facility] for tasks assessed/performed                                    Past Medical History:  Diagnosis Date   Abnormal levels of other serum enzymes 07/17/2015   Arthritis     Constipation 07/11/2015   COVID-19 03/06/2021   Diabetes mellitus without complication (HCC)     Elevated liver enzymes     Fatty liver     Generalized abdominal pain 07/11/2015   Hyperlipidemia     Hypertension     Lifelong obesity     Macular degeneration     Morbid obesity due to excess calories (HCC) 07/11/2015   Other fatigue 07/11/2015   Sleep apnea     Spinal headache           Past Surgical History:  Procedure Laterality Date   ABDOMINAL HYSTERECTOMY       APPENDECTOMY       CATARACT EXTRACTION       CERVICAL LAMINECTOMY   07/21/1985   CESAREAN SECTION       COLONOSCOPY   07/21/2012    diverticulosis, [facility] Dr. [name]    COLONOSCOPY WITH PROPOFOL N/A 02/04/2019    Procedure: COLONOSCOPY WITH PROPOFOL;  Surgeon: [name], MD;  Location: [facility] ENDOSCOPY;  Service: Endoscopy;  Laterality: N/A;   COLONOSCOPY WITH PROPOFOL N/A 03/18/2021    Procedure: COLONOSCOPY WITH PROPOFOL;  Surgeon: [name], MD;  Location: [facility] ENDOSCOPY;  Service: Endoscopy;  Laterality: N/A;  DM   DIAGNOSTIC LAPAROSCOPY       ESOPHAGOGASTRODUODENOSCOPY (EGD) WITH PROPOFOL N/A 02/04/2019    Procedure: ESOPHAGOGASTRODUODENOSCOPY (EGD) WITH PROPOFOL;  Surgeon: [name], MD;  Location: [facility]  ENDOSCOPY;  Service: Endoscopy;  Laterality: N/A;   ESOPHAGOGASTRODUODENOSCOPY (EGD) WITH PROPOFOL N/A 03/18/2021    Procedure: ESOPHAGOGASTRODUODENOSCOPY (EGD) WITH PROPOFOL;  Surgeon: [name], MD;  Location: [facility] ENDOSCOPY;  Service: Endoscopy;  Laterality: N/A;   LOOP RECORDER INSERTION N/A 08/05/2021    Procedure: LOOP RECORDER INSERTION;  Surgeon: [name], MD;  Location: [facility] INVASIVE CV LAB;  Service: Cardiovascular;  Laterality: N/A;   SPINE SURGERY       TUBAL LIGATION            Patient Active Problem List    Diagnosis Date Noted   Paroxysmal atrial fibrillation (HCC) 08/08/2022   Hypercoagulable state due to paroxysmal atrial fibrillation (HCC) 08/08/2022   Cerebral edema (HCC) 08/07/2021   OSA (obstructive sleep apnea) 08/07/2021   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   CVA (cerebral vascular accident) (HCC) 08/01/2021   GERD (gastroesophageal reflux disease) 07/20/2019   Advanced care planning/counseling discussion 12/17/2016   Skin lesions, generalized 11/13/2016   Chronic right hip pain 06/17/2016   Vitamin D deficiency 09/26/2015   Diastasis recti 08/08/2015  Elevated serum GGT level 07/24/2015   Essential hypertension 07/17/2015   Fatty liver 07/17/2015   Elevated alkaline phosphatase level 07/17/2015   Elevated serum glutamic pyruvic transaminase (SGPT) level 07/17/2015   Abnormal finding on EKG 07/11/2015   Morbid obesity (HCC) 07/11/2015   Colon, diverticulosis 07/11/2015   Abdominal wall hernia 07/11/2015   DM (diabetes mellitus), type 2 with neurological complications (HCC)     Hyperlipidemia        ONSET DATE: 08/01/2021   REFERRING DIAG: CVA   THERAPY DIAG:  Muscle weakness (generalized)   Rationale for Evaluation and Treatment Rehabilitation   SUBJECTIVE:    SUBJECTIVE STATEMENT:    Pt. reports that she was able to renew her driver's license. Pt accompanied by: self   PERTINENT HISTORY:  Pt. is a 77 y.o.  female who had a unexpectedly hospitalized from 2/12-2/14/2024 for COVID-19, and a UTI.  Pt. 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 Pt. attended inpatient rehab from 08/07/2021-08/22/2021. Pt. received Home health therapy services this past spring.  Pt. has recently had an assessment through driver rehabilitation services at [facility] with recommendations for referrals for  outpatient OT/PT, and ST services. Pt. PMHx includes: HTN, Hyperlipidemia, Macular degeneration, DM, and Obesity.   PRECAUTIONS: None   WEIGHT BEARING RESTRICTIONS No   PAIN:  Are you having pain? none   FALLS: Has patient fallen in last 6 months? Yes.   LIVING ENVIRONMENT: Lives with: lives with their family  Son  [name] Lives in: House/apartment Main living are on one floor Stairs:  2 steps to enter Has following equipment at home: Single point cane, Wheelchair (manual), shower chair, Grab bars, bed rail, and rubber mat.   PLOF: Independent   PATIENT GOALS: To be able to Drive, and cook again   OBJECTIVE:    HAND DOMINANCE: Right    ADLs:  Transfers/ambulation related to ADLs: Independent Eating: Independent Grooming: [employer] Pt. reports being unable to use her left hand to perform haircare.  UB Dressing: Independent pullover shirt, independent now with fastening a bra LB Dressing: Independent donning pants socks, and slide on shoes Toileting: Independent Bathing: Independent Tub Shower transfers: Walk-in shower Independent now     IADLs: Shopping: Needs to be accompanied to the grocery store. Light housekeeping: Do  Meal Prep:  Is able to perform light meal prep Community mobility: Relies on son, and friend Medication management: Son sets up Mining engineer management:TBD Handwriting: TBD   MOBILITY STATUS: Hx of falls out of bed     ACTIVITY TOLERANCE: Activity tolerance:  10-20 min. Before rest break   FUNCTIONAL OUTCOME  MEASURES: FOTO: 61  TR score: 62  10/09/2022: FOTO: 49   UPPER EXTREMITY ROM      Active ROM Right eval Left eval Left  10/09/2022 Left 10/23/22  Shoulder flexion WFL 54(74) scaption 0(55) 26(80)  Shoulder abduction WFL 58(75) 32(54) 32(50)  Shoulder adduction        Shoulder extension        Shoulder internal rotation        Shoulder external rotation        Elbow flexion [facility] WFL 145 145  Elbow extension [facility] WFL 10(0) 10(0)  Wrist flexion WFL TBD [facility] WFL  Wrist extension WFL TBD [facility] WFL  Wrist ulnar deviation        Wrist radial deviation        Wrist pronation        Wrist supination        (  Blank rows = not tested)     UPPER EXTREMITY MMT:      MMT Right eval Left eval Left 10/09/2022 Left 10/23/2022  Shoulder flexion 4+/5 2+/5 1/5 2-/5  Shoulder abduction 4+/5 2+/5 2-/5 2-/5  Shoulder adduction        Shoulder extension        Shoulder internal rotation        Shoulder external rotation        Middle trapezius        Lower trapezius        Elbow flexion 4+/5 3+/5 3/5 3/5  Elbow extension 4+/5 3+/5 3/5 3/5  Wrist flexion 4+/5 TBD 3/5 3/5  Wrist extension 4+/5 TBD 3/5 3/5  Wrist ulnar deviation        Wrist radial deviation        Wrist pronation        Wrist supination        (Blank rows = not tested)   HAND FUNCTION: Grip strength: Right: 33 lbs; Left: 11 lbs, Lateral pinch: Right: 17 lbs, Left: 2 lbs, and 3 point pinch: Right: 17 lbs, Left: 2 lbs  10/09/2022: Grip strength: Right: 33 lbs; Left: 10 lbs, Lateral pinch: Right: 17 lbs, Left: 1 lbs, and 3 point pinch: Right: 17 lbs, Left: 1 lbs   10/23/2022: Grip strength: Right: 33 lbs; Left: 16 lbs, Lateral pinch: Right: 17 lbs, Left: 1 lbs, and 3 point pinch: Right: 17 lbs, Left: 1 lbs   COORDINATION:   9 Hole Peg test: Right: 27 sec.  sec; Left: 52 sec.  10/09/2022: 9 Hole Peg test: Right: 27 sec.  sec; Left: 1 min. & 35 sec.   SENSATION: Light touch: WFL Proprioception: WFL   COGNITION: Overall  cognitive status: Within functional limits for tasks assessed   VISION: Subjective report: Glasses. Just received a new prescription to increase bifocal strength Baseline vision: Macular Degeneration Visual history: macular degeneration   VISION ASSESSMENT: To be further assessed in functional context  Left sided Awareness      PERCEPTION: Limited left sided awareness     TODAY'S TREATMENT:    Therapeutic Exercise:   Pt. worked on BUE strengthening, and reciprocal motion using the UBE while seated for 8 min. with no resistance initially, and the resistance gradually increasing. Constant monitoring was provided. Pt. performed AROM for left scapular elevation, depression, abduction/rotation. Pt. worked on  PROM, and AAROM for shoulder flexion, and abduction.   Therapeutic Activities:   Pt. Performed visual scanning tasks. Pt. completed a single crowded letter search  with one omission on the right in 3 min. & 16 sec., Word search with no omissions in 2 min. & 58 sec., and Random complex circles search with one omission on the left  in 3 min. & 15 sec.  PATIENT EDUCATION: Education details: Compensatory strategies during typing. Person educated: Patient Education method: verbal cues Education comprehension: verbalized understanding and pt does not plan to cook unless son is present.     HOME EXERCISE PROGRAM:  Reviewed home activities to enhance left hand digit extension  during typing skills.   GOALS: Goals reviewed with patient? Yes     SHORT TERM GOALS: Target date: 10/28/2022     Pt. Will improve FOTO score by 2 points to reflect improved pt. perceived functional performance Baseline: 10/23/2022: FOTO 49 10/09/2022: FOTO 49, FOTO: 61 TR score: 62  Goal status: INITIAL   LONG TERM GOALS: Target date: 12/09/2022    Pt. Will improve  left shoulder ROM by 10 degrees to be able able to efficiently apply deodorant Baseline: 10/23/2022: Shoulder flexion: 26(80), Abduction: 32(50)  10/09/2022: flexion: 0(55), Abduction: 32(54) Goal status: Improving, but very limited.   2.  Pt. will be able to independently hold and use a blow dryer, and brush for hair care. Baseline: 10/23/2022: Pt. Continues to be unable to hold a blow dryer. Eval: Pt. Is unable to sustain her BUEs in elevation, and use a blow dryer, and brush. 10/09/2022: Pt. Is unable to hold a blow dryer. Eval: Pt. Is unable to sustain her BUEs in elevation, and use a blow dryer, and brush. Goal status: Ongoing   3.  Pt. Will improve left lateral pinch strength by 3# to be able to independently cut meat. Baseline: 10/23/2022: Pt. Continues to present with difficulty cutting meat. 10/09/2022: 1#  Eval: 2#. Pt. has difficulty stabilizing utensil, and food while cutting food. Goal status: Ongoing   4.  Pt. Will improve Left hand [facility] skills to be able to be able to independently, and efficiently manipulate small objects during ADL tasks. Baseline 10/23/2022: Pt. Continues to present with difficulty manipulating small objects. 10/09/2022: Left: 1 min & 35 sec.: Eval: Left: 52 sec. Right: 27 sec. Goal status: Ongoing     6.: Pt. will improve with left grip strength to be able to hold and pull up covers. Baseline: 10/23/2022: Left: 16# 10/09/2022: Left: 10# Eval:  Left grip strength 11#. Pt. has difficulty holding a full drink  securely with the left hand.             Goal status: Ongoing   7. Pt. Will improve typing speed, and accuracy in preparation for efficiently typing simple email message. Baseline: 10/23/2022: Pt. continues to present with limited speed, and accuracy with typing. 10/09/2022: Pt. presents with difficulty typing. Pt. presents with difficulty typing an email. Typing speed, and accuracy TBD             Goal status: INITIAL                                        CLINICAL IMPRESSION:  Pt. reports 2/10 left shoulder pain initially, however improved with ROM. Pt. continues to present with limited left shoulder  ROM. Pt. tolerated the UBE well this morning, and was able to tolerate the addition of minimal resistance. Pt. required increased time to complete visual scanning tasks while utilizing mostly horizontal, and horizontal rectilinear visual search patterns. Pt. continues to benefit from OT services to work towards improving Left UE functioning in order to maximize engagement, and overall independence with ADLs, and IADL tasks.     PERFORMANCE DEFICITS in functional skills including ADLs, IADLs, coordination, dexterity, sensation, ROM, strength, FMC, decreased knowledge of use of DME, vision, and UE functional use, cognitive skills including attention, and psychosocial skills including coping strategies, environmental adaptation, habits, and routines and behaviors.    IMPAIRMENTS are limiting patient from ADLs, IADLs, and social participation.    COMORBIDITIES may have co-morbidities  that affects occupational performance. Patient will benefit from skilled OT to address above impairments and improve overall function.   MODIFICATION OR ASSISTANCE TO COMPLETE EVALUATION: Min-Moderate modification of tasks or assist with assess necessary to complete an evaluation.   OT OCCUPATIONAL PROFILE AND HISTORY: Detailed assessment: Review of records and additional review of physical, cognitive, psychosocial history related to current functional performance.  CLINICAL DECISION MAKING: Moderate - several treatment options, min-mod task modification necessary   REHAB POTENTIAL: Good   EVALUATION COMPLEXITY: Moderate      PLAN: OT FREQUENCY: 2x/week   OT DURATION: 12 weeks   PLANNED INTERVENTIONS: self care/ADL training, therapeutic exercise, therapeutic activity, neuromuscular re-education, manual therapy, passive range of motion, and paraffin   RECOMMENDED OTHER SERVICES: ST, and PT   CONSULTED AND AGREED WITH PLAN OF CARE: Patient   PLAN FOR NEXT SESSION:  L hand strengthening and coordination  exercises    Olegario Messier, MS, OTR/L  11/18/22, 1:51 PM

## 2022-11-18 NOTE — Therapy (Signed)
OUTPATIENT PHYSICAL THERAPY  TREATMENT   Patient Name: Alyssa Mayo MRN: [id number] DOB:03/28/1946, 77 y.o., female Today's Date: 11/18/2022  END OF SESSION:   PT End of Session - 11/18/22 1104     Visit Number 16    Number of Visits 36    Date for PT Re-Evaluation 01/22/23    Authorization Type [employer]    Progress Note Due on Visit 20    PT Start Time 1103    PT Stop Time 1145    PT Time Calculation (min) 42 min    Equipment Utilized During Treatment Gait belt    Activity Tolerance Patient tolerated treatment well;No increased pain    Behavior During Therapy [facility] for tasks assessed/performed                   Past Medical History:  Diagnosis Date   Abnormal levels of other serum enzymes 07/17/2015   Arthritis    Constipation 07/11/2015   COVID-19 03/06/2021   Diabetes mellitus without complication (HCC)    Elevated liver enzymes    Fatty liver    Generalized abdominal pain 07/11/2015   Hyperlipidemia    Hypertension    Lifelong obesity    Macular degeneration    Morbid obesity due to excess calories (HCC) 07/11/2015   Other fatigue 07/11/2015   Sleep apnea    Spinal headache    Past Surgical History:  Procedure Laterality Date   ABDOMINAL HYSTERECTOMY     APPENDECTOMY     CATARACT EXTRACTION     CERVICAL LAMINECTOMY  07/21/1985   CESAREAN SECTION     COLONOSCOPY  07/21/2012   diverticulosis, [facility] Dr. [name]    COLONOSCOPY WITH PROPOFOL N/A 02/04/2019   Procedure: COLONOSCOPY WITH PROPOFOL;  Surgeon: [name], MD;  Location: [facility] ENDOSCOPY;  Service: Endoscopy;  Laterality: N/A;   COLONOSCOPY WITH PROPOFOL N/A 03/18/2021   Procedure: COLONOSCOPY WITH PROPOFOL;  Surgeon: [name], MD;  Location: [facility] ENDOSCOPY;  Service: Endoscopy;  Laterality: N/A;  DM   DIAGNOSTIC LAPAROSCOPY     ESOPHAGOGASTRODUODENOSCOPY (EGD) WITH PROPOFOL N/A 02/04/2019   Procedure: ESOPHAGOGASTRODUODENOSCOPY (EGD) WITH PROPOFOL;  Surgeon:  [name], MD;  Location: [facility] ENDOSCOPY;  Service: Endoscopy;  Laterality: N/A;   ESOPHAGOGASTRODUODENOSCOPY (EGD) WITH PROPOFOL N/A 03/18/2021   Procedure: ESOPHAGOGASTRODUODENOSCOPY (EGD) WITH PROPOFOL;  Surgeon: [name], MD;  Location: [facility] ENDOSCOPY;  Service: Endoscopy;  Laterality: N/A;   LOOP RECORDER INSERTION N/A 08/05/2021   Procedure: LOOP RECORDER INSERTION;  Surgeon: [name], MD;  Location: [facility] INVASIVE CV LAB;  Service: Cardiovascular;  Laterality: N/A;   SPINE SURGERY     TUBAL LIGATION     Patient Active Problem List   Diagnosis Date Noted   UTI (urinary tract infection) 09/01/2022   COVID-19 virus infection 09/01/2022   Chronic diastolic CHF (congestive heart failure) (HCC) 09/01/2022   Chronic kidney disease, stage 3a (HCC) 09/01/2022   Obesity (BMI 30-39.9) 09/01/2022   Paroxysmal atrial fibrillation (HCC) 08/08/2022   Hypercoagulable state due to paroxysmal atrial fibrillation (HCC) 08/08/2022   Cerebral edema (HCC) 08/07/2021   OSA (obstructive sleep apnea) 08/07/2021   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   CVA (cerebral vascular accident) (HCC) 08/01/2021   GERD (gastroesophageal reflux disease) 07/20/2019   Advanced care planning/counseling discussion 12/17/2016   Skin lesions, generalized 11/13/2016   Chronic right hip pain 06/17/2016   Vitamin D deficiency 09/26/2015   Diastasis recti 08/08/2015   Elevated serum GGT  level 07/24/2015   Essential hypertension 07/17/2015   Fatty liver 07/17/2015   Elevated alkaline phosphatase level 07/17/2015   Elevated serum glutamic pyruvic transaminase (SGPT) level 07/17/2015   Abnormal finding on EKG 07/11/2015   Morbid obesity (HCC) 07/11/2015   Colon, diverticulosis 07/11/2015   Abdominal wall hernia 07/11/2015   DM (diabetes mellitus), type 2 with neurological complications (HCC)    Hyperlipidemia     PCP: [name], MD  REFERRING PROVIDER: [name], MD &  Edilia Bo, [place]  REFERRING DIAG: [phone] (ICD-10-CM) - Cerebral infarction due to unspecified occlusion or stenosis of unspecified precerebral arteries, Cervicalgia with degenerative disc  THERAPY DIAG:  Muscle weakness (generalized)  Pain in left hip  Other lack of coordination  Cervical pain  Unsteadiness on feet  Painful cervical ROM  Rationale for Evaluation and Treatment: Rehabilitation  ONSET DATE: 09/03/22  SUBJECTIVE:                                                                                                                                                                                                         SUBJECTIVE STATEMENT:  Pt that she is sleeping in bed again, but is waking up several times a night with pain the LUE. States that she was able to walk across soccer field to watch grandson over the weekend over grass and Astroturf. No pain at start of or throughout PT treatment session.    PERTINENT HISTORY:   Per chart and confirmed by pt: Pt is a 77 yo female with RMCA CVA infarction with hemorrhagic transformation 08/01/2021, with inpatient rehab 08/07/2021-08/22/2021 followed by [facility] therapy until June. Pt now currently being seen for outpatient OT and ST services. Pt reports limitations in stamina persist. Other PMH is significant for HTN, HLD, macular degeneration, DM, obesity, sleep apnea, arthritis, chronic R hip pain, vitamin D deficiency, diastasis recti, abdominal wall hernia, hx of abdominal hysterectomy, appendectomy, spine surgery (years ago).   PAIN:  Are you having pain? Yes: NPRS scale: 2/10 Pain location:  L hip/L knee  Pain description: dull ache Aggravating factors: sitting Relieving factors: laying down  PRECAUTIONS: None  WEIGHT BEARING RESTRICTIONS: No  FALLS:  Has patient fallen in last 6 months? Yes. Number of falls 2  LIVING ENVIRONMENT: Lives with: lives with their son Lives in: House/apartment Stairs: Yes: Internal: 13  steps; doesn't go up to the second floor and External: 4 steps; can reach both Has following equipment at home: Single point cane and Walker - 2 wheeled  OCCUPATION: Retired  PLOF: Independent with basic ADLs  PATIENT GOALS: "Get stronger. And reduce fall risk. Reduce reliance on cane"  NEXT MD VISIT: end of May.   OBJECTIVE:   DIAGNOSTIC FINDINGS:    CT HEAD WITHOUT CONTRAST  CT CERVICAL SPINE WITHOUT CONTRAST IMPRESSION: 1. No acute intracranial pathology. 2. Expected interval evolution of encephalomalacia of the right frontal lobe, following intraparenchymal hemorrhage seen acutely on examination dated 08/03/2021. 3. No fracture or static subluxation of the cervical spine. 4. Generally mild disc space height loss and osteophytosis throughout the cervical spine, with partial ankylosis of C5-C6 and a large left eccentric posterior bridging osteophyte at this level. MRI may be used to further evaluate cervical disc and neural foraminal pathology if indicated by neurologically localizing signs and symptoms.  EXAM: xRay PELVIS - 1-2 VIEW  IMPRESSION: 1. No convincing evidence of acute fracture or diastasis of the pelvis. 2. Well corticated osseous focus beneath the right inferior pubic ramus may reflect sequela of avulsion injury or chronic tendinopathy, suggest correlation with direct tenderness to palpation.   PATIENT SURVEYS:  NDI 15/50 = 30% disability FOTO 65/68   10/23/2022 : 52/68  10/23/2022 Foto CVA 57/59  COGNITION: Overall cognitive status: History of cognitive impairments - at baseline  SENSATION: WFL  POSTURE: rounded shoulders and forward head  PALPATION:     CERVICAL ROM:   Active ROM A/PROM (deg) eval 3/21 4/4  Flexion 39 25 39  Extension 10 24 25   Right lateral flexion 7 15 15   Left lateral flexion 6 20 14   Right rotation 49 53 41  Left rotation 30 42 35   (Blank rows = not tested)  UPPER EXTREMITY ROM:  Active ROM Right eval  Left eval Right 4/4 Left 4/4  Shoulder flexion 128 81 149 22  Shoulder abduction 160 68  158  38   (Blank rows = not tested)   UPPER EXTREMITY MMT:  Grossly decreased (3+/5) in the L UE due to pain and lack of ROM   CERVICAL SPECIAL TESTS:  Spurling's test: Negative and Sharp pursor's test: Negative      LOWER EXTREMITY MMT:    MMT Right 3/21 Left 3/21  Hip flexion 4+ 4-  Hip extension 4+ 4-  Hip abduction 4+ 4  Hip adduction 4+ 4  Hip internal rotation    Hip external rotation    Knee flexion 4+ 4-  Knee extension 4+ 4  Ankle dorsiflexion 4+ 4  Ankle plantarflexion    Ankle inversion    Ankle eversion     (Blank rows = not tested)  LOWER EXTREMITY ROM:     Passive  Right 3/21 Left 3/21  Hip flexion 120 105  Hip extension    Hip abduction    Hip adduction    Hip internal rotation    Hip external rotation    Knee flexion [facility] WFL  Knee extension (in Hip flexion 90deg) 143 120  Ankle dorsiflexion    Ankle plantarflexion    Ankle inversion    Ankle eversion     (Blank rows = not tested)    LOWER EXTREMITY SPECIAL TESTS:  Hip special tests: Luisa Hart (FABER) test: positive , Hip scouring test: negative, and distraction: negative    FUNCTIONAL TESTS:  Not performed at this time.  TODAY'S TREATMENT: DATE: 11/18/22  Pt performed gait with no resistance x 13ft (39ft with SPC and 17ft with no UE support) Performed additional gait with 3# ankle weight on BLE x 150 no UE support. Mild unsteadiness, but able to self correct  with stepping strategy without addition support.    Resisted gait in Matrix cable machine forward 2 x 15 and side stepping x15 ft bil    Forward Foot tap on 6 inch step from level ground x 10 BLE and standing on airex pad x 10 bil.  Lateral foot tap on 6inch step x 10 BLE and then performed x 10 BLE Standing on airex pad.  Step up/down 6inch step with limited UE support. CGA for safety with 1 near LOB due to poor step length with LLE.    CGA provided by PT throughout session for safety unless otherwise stated.    PATIENT EDUCATION:  Education details: Pt educated throughout session about proper posture and technique with exercises. Improved exercise technique, movement at target joints, use of target muscles after min to mod verbal, visual, tactile cues.  Person educated: Patient and   Education method: Explanation Education comprehension: verbalized understanding  HOME EXERCISE PROGRAM:  Access Code: [email or web address] URL: [email or web address] Date: 09/18/2022 Prepared by: Tomasa Hose  Exercises - Seated Upper Trapezius Stretch  - 1 x daily - 7 x weekly - 3 sets - 10 reps - Seated Levator Scapulae Stretch  - 1 x daily - 7 x weekly - 3 sets - 10 reps - Seated Scapular Retraction  - 1 x daily - 7 x weekly - 3 sets - 10 reps  ASSESSMENT:  CLINICAL IMPRESSION: Continuing PT POC with focus on LLE strength and mobility.  No pain in cervical spine on this day. Pt able to tolerate increased load/resistance to BLE for strengthening as well as increased focuse on functional mobility training over isolated muscular strengthening. Pain in L shoulder continues to limit ROM, but pt reports that this is being addressed in OT.   Pt would benefit from skilled PT to continue to improve cervical ROM as well as address impairments of the LLE for pain management and improved strength and function to improve QoL and reduce dependence on husband as caregiver.       OBJECTIVE IMPAIRMENTS: decreased activity tolerance, decreased cognition, decreased ROM, decreased strength, decreased safety awareness, hypomobility, and pain.   ACTIVITY LIMITATIONS: carrying, lifting, bending, sitting, standing, squatting, and reach over head  PARTICIPATION LIMITATIONS: meal prep, cleaning, laundry, driving, shopping, community activity, and yard work  PERSONAL FACTORS: Age, Behavior pattern, Past/current experiences, Time since onset of  injury/illness/exacerbation, and 3+ comorbidities: arthritis, DM2, HTN, Obesity,   are also affecting patient's functional outcome.   REHAB POTENTIAL: Good  CLINICAL DECISION MAKING: Stable/uncomplicated  EVALUATION COMPLEXITY: Low   GOALS: Goals reviewed with patient? Yes  SHORT TERM GOALS: Target date: 12/11/2022    Pt will be independent with HEP in order to improve strength of BLE and ROM of the neck in order to return to PLOF. Baseline: provided on neck visit Goal status: INITIAL    LONG TERM GOALS: Target date: 01/22/2023    Patient will demonstrate improved function as evidenced by a score of 68 on FOTO measure for full participation in activities at home and in the community. Baseline: 65. 10/23/22: 52 Goal status: IN PROGRESS  2.  Pt to improve NDI by 5 points or 10% in order to demonstrate a reduction in overall disability caused by neck pain at this time. Baseline: 15/50 or 30% disability 4/11 27/50  Goal status: NOT MET  3.  Pt to demonstrate improved cervical rotation to 60 deg bilaterally in order to perform tasks around the house that require turning of the head. Baseline: R/L Cervical  Rotation: 49/30. [id number] R:53. L:42 Goal status: IN PROGRESS  4.  Pt to report 0/10 pain in the neck following light household chores at home in order to demonstrate an overall reduction in pain and an improvement in QoL. Baseline: 4/10 at rest 0/10 in neck Goal status: MET  4.  Pt will improve gait speed to <0.39m/s to indicate improved community mobility and reduced fall risk  Goal status: New.  initial.   5. Pt will increase 6 min walk test >1015ft   PLAN:  PT FREQUENCY: 1-2x/week  PT DURATION: 12 weeks  PLANNED INTERVENTIONS: Therapeutic exercises, Therapeutic activity, Neuromuscular re-education, Balance training, Gait training, Patient/Family education, Self Care, Joint mobilization, Joint manipulation, Stair training, Vestibular training, Canalith repositioning,  Visual/preceptual remediation/compensation, DME instructions, Dry Needling, Cognitive remediation, Electrical stimulation, Spinal manipulation, Spinal mobilization, Cryotherapy, Moist heat, Traction, Ultrasound, Manual therapy, and Re-evaluation    PLAN FOR NEXT SESSION:    Continue to address cervical ROM and strength deficits.  Increased strengthening and balance training.   Grier Rocher PT, DPT  Physical Therapist - [facility]  11:48 AM 11/18/22

## 2022-11-20 ENCOUNTER — Ambulatory Visit: Payer: Medicare HMO | Attending: Adult Health | Admitting: Occupational Therapy

## 2022-11-20 ENCOUNTER — Ambulatory Visit: Payer: Medicare HMO | Admitting: Physical Therapy

## 2022-11-20 DIAGNOSIS — M25552 Pain in left hip: Secondary | ICD-10-CM

## 2022-11-20 DIAGNOSIS — M6281 Muscle weakness (generalized): Secondary | ICD-10-CM

## 2022-11-20 DIAGNOSIS — R2681 Unsteadiness on feet: Secondary | ICD-10-CM | POA: Insufficient documentation

## 2022-11-20 DIAGNOSIS — R482 Apraxia: Secondary | ICD-10-CM

## 2022-11-20 DIAGNOSIS — I63511 Cerebral infarction due to unspecified occlusion or stenosis of right middle cerebral artery: Secondary | ICD-10-CM | POA: Diagnosis present

## 2022-11-20 DIAGNOSIS — R262 Difficulty in walking, not elsewhere classified: Secondary | ICD-10-CM | POA: Diagnosis present

## 2022-11-20 DIAGNOSIS — R278 Other lack of coordination: Secondary | ICD-10-CM

## 2022-11-20 DIAGNOSIS — M542 Cervicalgia: Secondary | ICD-10-CM | POA: Insufficient documentation

## 2022-11-20 NOTE — Therapy (Signed)
OUTPATIENT PHYSICAL THERAPY  TREATMENT   Patient Name: Alyssa Mayo MRN: [id number] DOB:July 05, 1946, 77 y.o., female Today's Date: 11/20/2022  END OF SESSION:   PT End of Session - 11/20/22 1106     Visit Number 17    Number of Visits 36    Date for PT Re-Evaluation 01/22/23    Authorization Type [employer]    Progress Note Due on Visit 20    PT Start Time 1105    PT Stop Time 1146    PT Time Calculation (min) 41 min    Equipment Utilized During Treatment Gait belt    Activity Tolerance Patient tolerated treatment well;No increased pain    Behavior During Therapy [facility] for tasks assessed/performed                   Past Medical History:  Diagnosis Date   Abnormal levels of other serum enzymes 07/17/2015   Arthritis    Constipation 07/11/2015   COVID-19 03/06/2021   Diabetes mellitus without complication (HCC)    Elevated liver enzymes    Fatty liver    Generalized abdominal pain 07/11/2015   Hyperlipidemia    Hypertension    Lifelong obesity    Macular degeneration    Morbid obesity due to excess calories (HCC) 07/11/2015   Other fatigue 07/11/2015   Sleep apnea    Spinal headache    Past Surgical History:  Procedure Laterality Date   ABDOMINAL HYSTERECTOMY     APPENDECTOMY     CATARACT EXTRACTION     CERVICAL LAMINECTOMY  07/21/1985   CESAREAN SECTION     COLONOSCOPY  07/21/2012   diverticulosis, [facility] Dr. [name]    COLONOSCOPY WITH PROPOFOL N/A 02/04/2019   Procedure: COLONOSCOPY WITH PROPOFOL;  Surgeon: [name], MD;  Location: [facility] ENDOSCOPY;  Service: Endoscopy;  Laterality: N/A;   COLONOSCOPY WITH PROPOFOL N/A 03/18/2021   Procedure: COLONOSCOPY WITH PROPOFOL;  Surgeon: [name], MD;  Location: [facility] ENDOSCOPY;  Service: Endoscopy;  Laterality: N/A;  DM   DIAGNOSTIC LAPAROSCOPY     ESOPHAGOGASTRODUODENOSCOPY (EGD) WITH PROPOFOL N/A 02/04/2019   Procedure: ESOPHAGOGASTRODUODENOSCOPY (EGD) WITH PROPOFOL;  Surgeon:  [name], MD;  Location: [facility] ENDOSCOPY;  Service: Endoscopy;  Laterality: N/A;   ESOPHAGOGASTRODUODENOSCOPY (EGD) WITH PROPOFOL N/A 03/18/2021   Procedure: ESOPHAGOGASTRODUODENOSCOPY (EGD) WITH PROPOFOL;  Surgeon: [name], MD;  Location: [facility] ENDOSCOPY;  Service: Endoscopy;  Laterality: N/A;   LOOP RECORDER INSERTION N/A 08/05/2021   Procedure: LOOP RECORDER INSERTION;  Surgeon: [name], MD;  Location: [facility] INVASIVE CV LAB;  Service: Cardiovascular;  Laterality: N/A;   SPINE SURGERY     TUBAL LIGATION     Patient Active Problem List   Diagnosis Date Noted   UTI (urinary tract infection) 09/01/2022   COVID-19 virus infection 09/01/2022   Chronic diastolic CHF (congestive heart failure) (HCC) 09/01/2022   Chronic kidney disease, stage 3a (HCC) 09/01/2022   Obesity (BMI 30-39.9) 09/01/2022   Paroxysmal atrial fibrillation (HCC) 08/08/2022   Hypercoagulable state due to paroxysmal atrial fibrillation (HCC) 08/08/2022   Cerebral edema (HCC) 08/07/2021   OSA (obstructive sleep apnea) 08/07/2021   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   CVA (cerebral vascular accident) (HCC) 08/01/2021   GERD (gastroesophageal reflux disease) 07/20/2019   Advanced care planning/counseling discussion 12/17/2016   Skin lesions, generalized 11/13/2016   Chronic right hip pain 06/17/2016   Vitamin D deficiency 09/26/2015   Diastasis recti 08/08/2015   Elevated serum GGT  level 07/24/2015   Essential hypertension 07/17/2015   Fatty liver 07/17/2015   Elevated alkaline phosphatase level 07/17/2015   Elevated serum glutamic pyruvic transaminase (SGPT) level 07/17/2015   Abnormal finding on EKG 07/11/2015   Morbid obesity (HCC) 07/11/2015   Colon, diverticulosis 07/11/2015   Abdominal wall hernia 07/11/2015   DM (diabetes mellitus), type 2 with neurological complications (HCC)    Hyperlipidemia     PCP: [name], MD  REFERRING PROVIDER: [name], MD &  Edilia Bo, [place]  REFERRING DIAG: [phone] (ICD-10-CM) - Cerebral infarction due to unspecified occlusion or stenosis of unspecified precerebral arteries, Cervicalgia with degenerative disc  THERAPY DIAG:  Muscle weakness (generalized)  Pain in left hip  Cervical pain  Unsteadiness on feet  Apraxia  Right middle cerebral artery stroke (HCC)  Painful cervical ROM  Difficulty in walking, not elsewhere classified  Other lack of coordination  Rationale for Evaluation and Treatment: Rehabilitation  ONSET DATE: 09/03/22  SUBJECTIVE:                                                                                                                                                                                                         SUBJECTIVE STATEMENT:  Pt that she is sleeping in bed. Has been able to progress to sleeping through most of the night with minimal pain.  No pain at start of or throughout PT treatment session.    PERTINENT HISTORY:   Per chart and confirmed by pt: Pt is a 77 yo female with RMCA CVA infarction with hemorrhagic transformation 08/01/2021, with inpatient rehab 08/07/2021-08/22/2021 followed by [facility] therapy until June. Pt now currently being seen for outpatient OT and ST services. Pt reports limitations in stamina persist. Other PMH is significant for HTN, HLD, macular degeneration, DM, obesity, sleep apnea, arthritis, chronic R hip pain, vitamin D deficiency, diastasis recti, abdominal wall hernia, hx of abdominal hysterectomy, appendectomy, spine surgery (years ago).   PAIN:  Are you having pain? Yes: NPRS scale: 2/10 Pain location:  L hip/L knee  Pain description: dull ache Aggravating factors: sitting Relieving factors: laying down  PRECAUTIONS: None  WEIGHT BEARING RESTRICTIONS: No  FALLS:  Has patient fallen in last 6 months? Yes. Number of falls 2  LIVING ENVIRONMENT: Lives with: lives with their son Lives in: House/apartment Stairs: Yes:  Internal: 13 steps; doesn't go up to the second floor and External: 4 steps; can reach both Has following equipment at home: Single point cane and Walker - 2 wheeled  OCCUPATION: Retired  PLOF: Independent with basic ADLs  PATIENT GOALS: "Get stronger. And reduce fall risk. Reduce reliance on cane"  NEXT MD VISIT: end of May.   OBJECTIVE:   DIAGNOSTIC FINDINGS:    CT HEAD WITHOUT CONTRAST  CT CERVICAL SPINE WITHOUT CONTRAST IMPRESSION: 1. No acute intracranial pathology. 2. Expected interval evolution of encephalomalacia of the right frontal lobe, following intraparenchymal hemorrhage seen acutely on examination dated 08/03/2021. 3. No fracture or static subluxation of the cervical spine. 4. Generally mild disc space height loss and osteophytosis throughout the cervical spine, with partial ankylosis of C5-C6 and a large left eccentric posterior bridging osteophyte at this level. MRI may be used to further evaluate cervical disc and neural foraminal pathology if indicated by neurologically localizing signs and symptoms.  EXAM: xRay PELVIS - 1-2 VIEW  IMPRESSION: 1. No convincing evidence of acute fracture or diastasis of the pelvis. 2. Well corticated osseous focus beneath the right inferior pubic ramus may reflect sequela of avulsion injury or chronic tendinopathy, suggest correlation with direct tenderness to palpation.   PATIENT SURVEYS:  NDI 15/50 = 30% disability FOTO 65/68   10/23/2022 : 52/68  10/23/2022 Foto CVA 57/59  COGNITION: Overall cognitive status: History of cognitive impairments - at baseline  SENSATION: WFL  POSTURE: rounded shoulders and forward head  PALPATION:     CERVICAL ROM:   Active ROM A/PROM (deg) eval 3/21 4/4  Flexion 39 25 39  Extension 10 24 25   Right lateral flexion 7 15 15   Left lateral flexion 6 20 14   Right rotation 49 53 41  Left rotation 30 42 35   (Blank rows = not tested)  UPPER EXTREMITY ROM:  Active ROM  Right eval Left eval Right 4/4 Left 4/4  Shoulder flexion 128 81 149 22  Shoulder abduction 160 68  158  38   (Blank rows = not tested)   UPPER EXTREMITY MMT:  Grossly decreased (3+/5) in the L UE due to pain and lack of ROM   CERVICAL SPECIAL TESTS:  Spurling's test: Negative and Sharp pursor's test: Negative      LOWER EXTREMITY MMT:    MMT Right 3/21 Left 3/21  Hip flexion 4+ 4-  Hip extension 4+ 4-  Hip abduction 4+ 4  Hip adduction 4+ 4  Hip internal rotation    Hip external rotation    Knee flexion 4+ 4-  Knee extension 4+ 4  Ankle dorsiflexion 4+ 4  Ankle plantarflexion    Ankle inversion    Ankle eversion     (Blank rows = not tested)  LOWER EXTREMITY ROM:     Passive  Right 3/21 Left 3/21  Hip flexion 120 105  Hip extension    Hip abduction    Hip adduction    Hip internal rotation    Hip external rotation    Knee flexion [facility] WFL  Knee extension (in Hip flexion 90deg) 143 120  Ankle dorsiflexion    Ankle plantarflexion    Ankle inversion    Ankle eversion     (Blank rows = not tested)    LOWER EXTREMITY SPECIAL TESTS:  Hip special tests: Luisa Hart (FABER) test: positive , Hip scouring test: negative, and distraction: negative    FUNCTIONAL TESTS:  Not performed at this time.  TODAY'S TREATMENT: DATE: 11/20/22  Sit<>stand x 10 with no UE support   Squat with hip hinge x 12  Forward step up to 6inch step x 8 Bil  Lateral step up on 6inch step x8 bil   Cues for  full ROM and posture throughout to reduce stress on knees.   Gait with rollator through hall x 36ft with cues for brake management with use of RUE. Pt performed gait with 3# ankle weights x 168ft with supervision assist. Cues for step height with ankle weights. Noted to have improved step height with use of rollator. Pt educated on the benefits of rollator use vs SPC.   Dynamic balance to navigate obstacle course. Step up/down 4" and 6" step, across airex beam forward x 3 and  lateral x 1 bil. Weave through 10 cones and step over bolster on floor. CGA for step management and min assist for side stepping on airex beam. Performed x 2.       PATIENT EDUCATION:  Education details: Pt educated throughout session about proper posture and technique with exercises. Improved exercise technique, movement at target joints, use of target muscles after min to mod verbal, visual, tactile cues.  Person educated: Patient and   Education method: Explanation Education comprehension: verbalized understanding  HOME EXERCISE PROGRAM:  Access Code: [email or web address] URL: [email or web address] Date: 09/18/2022 Prepared by: Tomasa Hose  Exercises - Seated Upper Trapezius Stretch  - 1 x daily - 7 x weekly - 3 sets - 10 reps - Seated Levator Scapulae Stretch  - 1 x daily - 7 x weekly - 3 sets - 10 reps - Seated Scapular Retraction  - 1 x daily - 7 x weekly - 3 sets - 10 reps  ASSESSMENT:  CLINICAL IMPRESSION: Continuing PT POC with focus on LLE strength and mobility.  No pain in cervical spine on this day. Dynamic balance and strength training performed on this day. Mild LOB with cues for step height and width on the LLE intermittently. Pt would benefit from skilled PT to continue to improve cervical ROM as well as address impairments of the LLE for pain management and improved strength and function to improve QoL and reduce dependence on husband as caregiver.       OBJECTIVE IMPAIRMENTS: decreased activity tolerance, decreased cognition, decreased ROM, decreased strength, decreased safety awareness, hypomobility, and pain.   ACTIVITY LIMITATIONS: carrying, lifting, bending, sitting, standing, squatting, and reach over head  PARTICIPATION LIMITATIONS: meal prep, cleaning, laundry, driving, shopping, community activity, and yard work  PERSONAL FACTORS: Age, Behavior pattern, Past/current experiences, Time since onset of injury/illness/exacerbation, and 3+ comorbidities:  arthritis, DM2, HTN, Obesity,   are also affecting patient's functional outcome.   REHAB POTENTIAL: Good  CLINICAL DECISION MAKING: Stable/uncomplicated  EVALUATION COMPLEXITY: Low   GOALS: Goals reviewed with patient? Yes  SHORT TERM GOALS: Target date: 12/11/2022    Pt will be independent with HEP in order to improve strength of BLE and ROM of the neck in order to return to PLOF. Baseline: provided on neck visit Goal status: INITIAL    LONG TERM GOALS: Target date: 01/22/2023    Patient will demonstrate improved function as evidenced by a score of 68 on FOTO measure for full participation in activities at home and in the community. Baseline: 65. 10/23/22: 52 Goal status: IN PROGRESS  2.  Pt to improve NDI by 5 points or 10% in order to demonstrate a reduction in overall disability caused by neck pain at this time. Baseline: 15/50 or 30% disability 4/11 27/50  Goal status: NOT MET  3.  Pt to demonstrate improved cervical rotation to 60 deg bilaterally in order to perform tasks around the house that require turning of the head. Baseline: R/L Cervical Rotation: 49/30. 4/424 R:53. L:42  Goal status: IN PROGRESS  4.  Pt to report 0/10 pain in the neck following light household chores at home in order to demonstrate an overall reduction in pain and an improvement in QoL. Baseline: 4/10 at rest 0/10 in neck Goal status: MET  4.  Pt will improve gait speed to <0.28m/s to indicate improved community mobility and reduced fall risk  Goal status: New.  initial.   5. Pt will increase 6 min walk test >1044ft   PLAN:  PT FREQUENCY: 1-2x/week  PT DURATION: 12 weeks  PLANNED INTERVENTIONS: Therapeutic exercises, Therapeutic activity, Neuromuscular re-education, Balance training, Gait training, Patient/Family education, Self Care, Joint mobilization, Joint manipulation, Stair training, Vestibular training, Canalith repositioning, Visual/preceptual remediation/compensation, DME  instructions, Dry Needling, Cognitive remediation, Electrical stimulation, Spinal manipulation, Spinal mobilization, Cryotherapy, Moist heat, Traction, Ultrasound, Manual therapy, and Re-evaluation    PLAN FOR NEXT SESSION:    Increased strengthening and dynamic balance training.   Grier Rocher PT, DPT  Physical Therapist - [facility]  12:54 PM 11/20/22

## 2022-11-20 NOTE — Therapy (Signed)
Occupational Therapy Neuro Treatment Note   Patient Name: Alyssa Mayo MRN: [id number] DOB:12-23-1945, 77 y.o., female Today's Date: 03/14/2022   PCP: [name], MD REFERRING PROVIDER: [name], NP     OT End of Session - 11/20/22 1132     Visit Number 17    Number of Visits 24    Date for OT Re-Evaluation 12/09/22    Authorization Type Progress reporting period starting 09/16/2022    OT Start Time 1015    OT Stop Time 1100    OT Time Calculation (min) 45 min    Activity Tolerance Patient tolerated treatment well    Behavior During Therapy [facility] for tasks assessed/performed                                    Past Medical History:  Diagnosis Date   Abnormal levels of other serum enzymes 07/17/2015   Arthritis     Constipation 07/11/2015   COVID-19 03/06/2021   Diabetes mellitus without complication (HCC)     Elevated liver enzymes     Fatty liver     Generalized abdominal pain 07/11/2015   Hyperlipidemia     Hypertension     Lifelong obesity     Macular degeneration     Morbid obesity due to excess calories (HCC) 07/11/2015   Other fatigue 07/11/2015   Sleep apnea     Spinal headache           Past Surgical History:  Procedure Laterality Date   ABDOMINAL HYSTERECTOMY       APPENDECTOMY       CATARACT EXTRACTION       CERVICAL LAMINECTOMY   07/21/1985   CESAREAN SECTION       COLONOSCOPY   07/21/2012    diverticulosis, [facility] Dr. [name]    COLONOSCOPY WITH PROPOFOL N/A 02/04/2019    Procedure: COLONOSCOPY WITH PROPOFOL;  Surgeon: [name], MD;  Location: [facility] ENDOSCOPY;  Service: Endoscopy;  Laterality: N/A;   COLONOSCOPY WITH PROPOFOL N/A 03/18/2021    Procedure: COLONOSCOPY WITH PROPOFOL;  Surgeon: [name], MD;  Location: [facility] ENDOSCOPY;  Service: Endoscopy;  Laterality: N/A;  DM   DIAGNOSTIC LAPAROSCOPY       ESOPHAGOGASTRODUODENOSCOPY (EGD) WITH PROPOFOL N/A 02/04/2019    Procedure: ESOPHAGOGASTRODUODENOSCOPY (EGD)  WITH PROPOFOL;  Surgeon: [name], MD;  Location: [facility] ENDOSCOPY;  Service: Endoscopy;  Laterality: N/A;   ESOPHAGOGASTRODUODENOSCOPY (EGD) WITH PROPOFOL N/A 03/18/2021    Procedure: ESOPHAGOGASTRODUODENOSCOPY (EGD) WITH PROPOFOL;  Surgeon: [name], MD;  Location: [facility] ENDOSCOPY;  Service: Endoscopy;  Laterality: N/A;   LOOP RECORDER INSERTION N/A 08/05/2021    Procedure: LOOP RECORDER INSERTION;  Surgeon: [name], MD;  Location: [facility] INVASIVE CV LAB;  Service: Cardiovascular;  Laterality: N/A;   SPINE SURGERY       TUBAL LIGATION            Patient Active Problem List    Diagnosis Date Noted   Paroxysmal atrial fibrillation (HCC) 08/08/2022   Hypercoagulable state due to paroxysmal atrial fibrillation (HCC) 08/08/2022   Cerebral edema (HCC) 08/07/2021   OSA (obstructive sleep apnea) 08/07/2021   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   CVA (cerebral vascular accident) (HCC) 08/01/2021   GERD (gastroesophageal reflux disease) 07/20/2019   Advanced care planning/counseling discussion 12/17/2016   Skin lesions, generalized 11/13/2016   Chronic right hip pain 06/17/2016  Vitamin D deficiency 09/26/2015   Diastasis recti 08/08/2015   Elevated serum GGT level 07/24/2015   Essential hypertension 07/17/2015   Fatty liver 07/17/2015   Elevated alkaline phosphatase level 07/17/2015   Elevated serum glutamic pyruvic transaminase (SGPT) level 07/17/2015   Abnormal finding on EKG 07/11/2015   Morbid obesity (HCC) 07/11/2015   Colon, diverticulosis 07/11/2015   Abdominal wall hernia 07/11/2015   DM (diabetes mellitus), type 2 with neurological complications (HCC)     Hyperlipidemia        ONSET DATE: 08/01/2021   REFERRING DIAG: CVA   THERAPY DIAG:  Muscle weakness (generalized)   Rationale for Evaluation and Treatment Rehabilitation   SUBJECTIVE:    SUBJECTIVE STATEMENT:    Pt. reports  that she is doing well today.  Pt accompanied by:  self   PERTINENT HISTORY:  Pt. is a 77 y.o. female who had a unexpectedly hospitalized from 2/12-2/14/2024 for COVID-19, and a UTI.  Pt. 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 Pt. attended inpatient rehab from 08/07/2021-08/22/2021. Pt. received Home health therapy services this past spring.  Pt. has recently had an assessment through driver rehabilitation services at [facility] with recommendations for referrals for  outpatient OT/PT, and ST services. Pt. PMHx includes: HTN, Hyperlipidemia, Macular degeneration, DM, and Obesity.   PRECAUTIONS: None   WEIGHT BEARING RESTRICTIONS No   PAIN:  Are you having pain?  5/10    FALLS: Has patient fallen in last 6 months? Yes.   LIVING ENVIRONMENT: Lives with: lives with their family  Son  [name] Lives in: House/apartment Main living are on one floor Stairs:  2 steps to enter Has following equipment at home: Single point cane, Wheelchair (manual), shower chair, Grab bars, bed rail, and rubber mat.   PLOF: Independent   PATIENT GOALS: To be able to Drive, and cook again   OBJECTIVE:    HAND DOMINANCE: Right    ADLs:  Transfers/ambulation related to ADLs: Independent Eating: Independent Grooming: [employer] Pt. reports being unable to use her left hand to perform haircare.  UB Dressing: Independent pullover shirt, independent now with fastening a bra LB Dressing: Independent donning pants socks, and slide on shoes Toileting: Independent Bathing: Independent Tub Shower transfers: Walk-in shower Independent now     IADLs: Shopping: Needs to be accompanied to the grocery store. Light housekeeping: Do  Meal Prep:  Is able to perform light meal prep Community mobility: Relies on son, and friend Medication management: Son sets up Mining engineer management:TBD Handwriting: TBD   MOBILITY STATUS: Hx of falls out of bed     ACTIVITY TOLERANCE: Activity tolerance:  10-20 min. Before  rest break   FUNCTIONAL OUTCOME MEASURES: FOTO: 61  TR score: 62  10/09/2022: FOTO: 49   UPPER EXTREMITY ROM      Active ROM Right eval Left eval Left  10/09/2022 Left 10/23/22  Shoulder flexion WFL 54(74) scaption 0(55) 26(80)  Shoulder abduction WFL 58(75) 32(54) 32(50)  Shoulder adduction        Shoulder extension        Shoulder internal rotation        Shoulder external rotation        Elbow flexion [facility] WFL 145 145  Elbow extension [facility] WFL 10(0) 10(0)  Wrist flexion WFL TBD [facility] WFL  Wrist extension WFL TBD [facility] WFL  Wrist ulnar deviation        Wrist radial deviation        Wrist pronation  Wrist supination        (Blank rows = not tested)     UPPER EXTREMITY MMT:      MMT Right eval Left eval Left 10/09/2022 Left 10/23/2022  Shoulder flexion 4+/5 2+/5 1/5 2-/5  Shoulder abduction 4+/5 2+/5 2-/5 2-/5  Shoulder adduction        Shoulder extension        Shoulder internal rotation        Shoulder external rotation        Middle trapezius        Lower trapezius        Elbow flexion 4+/5 3+/5 3/5 3/5  Elbow extension 4+/5 3+/5 3/5 3/5  Wrist flexion 4+/5 TBD 3/5 3/5  Wrist extension 4+/5 TBD 3/5 3/5  Wrist ulnar deviation        Wrist radial deviation        Wrist pronation        Wrist supination        (Blank rows = not tested)   HAND FUNCTION: Grip strength: Right: 33 lbs; Left: 11 lbs, Lateral pinch: Right: 17 lbs, Left: 2 lbs, and 3 point pinch: Right: 17 lbs, Left: 2 lbs  10/09/2022: Grip strength: Right: 33 lbs; Left: 10 lbs, Lateral pinch: Right: 17 lbs, Left: 1 lbs, and 3 point pinch: Right: 17 lbs, Left: 1 lbs   10/23/2022: Grip strength: Right: 33 lbs; Left: 16 lbs, Lateral pinch: Right: 17 lbs, Left: 1 lbs, and 3 point pinch: Right: 17 lbs, Left: 1 lbs   COORDINATION:   9 Hole Peg test: Right: 27 sec.  sec; Left: 52 sec.  10/09/2022: 9 Hole Peg test: Right: 27 sec.  sec; Left: 1 min. & 35 sec.   SENSATION: Light touch:  WFL Proprioception: WFL   COGNITION: Overall cognitive status: Within functional limits for tasks assessed   VISION: Subjective report: Glasses. Just received a new prescription to increase bifocal strength Baseline vision: Macular Degeneration Visual history: macular degeneration   VISION ASSESSMENT: To be further assessed in functional context  Left sided Awareness      PERCEPTION: Limited left sided awareness     TODAY'S TREATMENT:    Therapeutic Exercise:   Pt. worked on BUE strengthening, and reciprocal motion using the UBE while seated for 8 min. with no resistance initially, and the resistance gradually increasing. Constant monitoring was provided. Pt. performed AROM for left scapular elevation, depression, abduction/rotation. Pt. worked on  PROM, and AAROM for shoulder flexion, and abduction.    Neuromuscular re-education:  Pt. worked on left hand [facility] skills grasping 1" sticks from a horizontal position in the shallow dish, and positioning the vertically in the pegboard..Pt. worked on removing the pegs using bilateral alternating hand patterns.     PATIENT EDUCATION: Education details: Compensatory strategies during typing. Person educated: Patient Education method: verbal cues Education comprehension: verbalized understanding and pt does not plan to cook unless son is present.     HOME EXERCISE PROGRAM:  Reviewed home activities to enhance left hand digit extension  during typing skills.   GOALS: Goals reviewed with patient? Yes     SHORT TERM GOALS: Target date: 10/28/2022     Pt. Will improve FOTO score by 2 points to reflect improved pt. perceived functional performance Baseline: 10/23/2022: FOTO 49 10/09/2022: FOTO 49, FOTO: 61 TR score: 62  Goal status: INITIAL   LONG TERM GOALS: Target date: 12/09/2022    Pt. Will improve left shoulder ROM by 10 degrees to  be able able to efficiently apply deodorant Baseline: 10/23/2022: Shoulder flexion: 26(80),  Abduction: 32(50) 10/09/2022: flexion: 0(55), Abduction: 32(54) Goal status: Improving, but very limited.   2.  Pt. will be able to independently hold and use a blow dryer, and brush for hair care. Baseline: 10/23/2022: Pt. Continues to be unable to hold a blow dryer. Eval: Pt. Is unable to sustain her BUEs in elevation, and use a blow dryer, and brush. 10/09/2022: Pt. Is unable to hold a blow dryer. Eval: Pt. Is unable to sustain her BUEs in elevation, and use a blow dryer, and brush. Goal status: Ongoing   3.  Pt. Will improve left lateral pinch strength by 3# to be able to independently cut meat. Baseline: 10/23/2022: Pt. Continues to present with difficulty cutting meat. 10/09/2022: 1#  Eval: 2#. Pt. has difficulty stabilizing utensil, and food while cutting food. Goal status: Ongoing   4.  Pt. Will improve Left hand [facility] skills to be able to be able to independently, and efficiently manipulate small objects during ADL tasks. Baseline 10/23/2022: Pt. Continues to present with difficulty manipulating small objects. 10/09/2022: Left: 1 min & 35 sec.: Eval: Left: 52 sec. Right: 27 sec. Goal status: Ongoing     6.: Pt. will improve with left grip strength to be able to hold and pull up covers. Baseline: 10/23/2022: Left: 16# 10/09/2022: Left: 10# Eval:  Left grip strength 11#. Pt. has difficulty holding a full drink  securely with the left hand.             Goal status: Ongoing   7. Pt. Will improve typing speed, and accuracy in preparation for efficiently typing simple email message. Baseline: 10/23/2022: Pt. continues to present with limited speed, and accuracy with typing. 10/09/2022: Pt. presents with difficulty typing. Pt. presents with difficulty typing an email. Typing speed, and accuracy TBD             Goal status: INITIAL                                        CLINICAL IMPRESSION:  Pt. continues to present with limited left shoulder ROM. Pt. tolerated LUE ROM, and the UBE well this morning.  Pt. continues to require cues, and assist for translatory movements, and FMC  Pt. continues to benefit from OT services to work towards improving Left UE functioning in order to maximize engagement, and overall independence with ADLs, and IADL tasks.     PERFORMANCE DEFICITS in functional skills including ADLs, IADLs, coordination, dexterity, sensation, ROM, strength, FMC, decreased knowledge of use of DME, vision, and UE functional use, cognitive skills including attention, and psychosocial skills including coping strategies, environmental adaptation, habits, and routines and behaviors.    IMPAIRMENTS are limiting patient from ADLs, IADLs, and social participation.    COMORBIDITIES may have co-morbidities  that affects occupational performance. Patient will benefit from skilled OT to address above impairments and improve overall function.   MODIFICATION OR ASSISTANCE TO COMPLETE EVALUATION: Min-Moderate modification of tasks or assist with assess necessary to complete an evaluation.   OT OCCUPATIONAL PROFILE AND HISTORY: Detailed assessment: Review of records and additional review of physical, cognitive, psychosocial history related to current functional performance.   CLINICAL DECISION MAKING: Moderate - several treatment options, min-mod task modification necessary   REHAB POTENTIAL: Good   EVALUATION COMPLEXITY: Moderate      PLAN: OT FREQUENCY: 2x/week  OT DURATION: 12 weeks   PLANNED INTERVENTIONS: self care/ADL training, therapeutic exercise, therapeutic activity, neuromuscular re-education, manual therapy, passive range of motion, and paraffin   RECOMMENDED OTHER SERVICES: ST, and PT   CONSULTED AND AGREED WITH PLAN OF CARE: Patient   PLAN FOR NEXT SESSION:  L hand strengthening and coordination exercises    Olegario Messier, MS, OTR/L  11/20/22, 11:35 AM

## 2022-11-24 ENCOUNTER — Ambulatory Visit (INDEPENDENT_AMBULATORY_CARE_PROVIDER_SITE_OTHER): Payer: Medicare HMO

## 2022-11-24 DIAGNOSIS — I639 Cerebral infarction, unspecified: Secondary | ICD-10-CM | POA: Diagnosis not present

## 2022-11-24 LAB — CUP PACEART REMOTE DEVICE CHECK
Date Time Interrogation Session: 20240505231206
Implantable Pulse Generator Implant Date: 20230116

## 2022-11-25 ENCOUNTER — Ambulatory Visit: Payer: Medicare HMO | Admitting: Physical Therapy

## 2022-11-25 ENCOUNTER — Ambulatory Visit: Payer: Medicare HMO | Admitting: Occupational Therapy

## 2022-11-25 DIAGNOSIS — M6281 Muscle weakness (generalized): Secondary | ICD-10-CM | POA: Diagnosis not present

## 2022-11-25 DIAGNOSIS — M542 Cervicalgia: Secondary | ICD-10-CM

## 2022-11-25 DIAGNOSIS — R482 Apraxia: Secondary | ICD-10-CM

## 2022-11-25 DIAGNOSIS — M25552 Pain in left hip: Secondary | ICD-10-CM

## 2022-11-25 DIAGNOSIS — R262 Difficulty in walking, not elsewhere classified: Secondary | ICD-10-CM

## 2022-11-25 DIAGNOSIS — I63511 Cerebral infarction due to unspecified occlusion or stenosis of right middle cerebral artery: Secondary | ICD-10-CM

## 2022-11-25 DIAGNOSIS — R278 Other lack of coordination: Secondary | ICD-10-CM

## 2022-11-25 DIAGNOSIS — R2681 Unsteadiness on feet: Secondary | ICD-10-CM

## 2022-11-25 NOTE — Therapy (Signed)
OUTPATIENT PHYSICAL THERAPY  TREATMENT   Patient Name: Alyssa Mayo MRN: [id number] DOB:[birth year], 77 y.o., female Today's Date: 11/25/2022  END OF SESSION:   PT End of Session - 11/25/22 1105     Visit Number 18    Number of Visits 36    Date for PT Re-Evaluation 01/22/23    Authorization Type [employer]    Progress Note Due on Visit 20    PT Start Time 1104    PT Stop Time 1145    PT Time Calculation (min) 41 min    Equipment Utilized During Treatment Gait belt    Activity Tolerance Patient tolerated treatment well;No increased pain    Behavior During Therapy [facility] for tasks assessed/performed                   Past Medical History:  Diagnosis Date   Abnormal levels of other serum enzymes 07/17/2015   Arthritis    Constipation 07/11/2015   COVID-19 03/06/2021   Diabetes mellitus without complication (HCC)    Elevated liver enzymes    Fatty liver    Generalized abdominal pain 07/11/2015   Hyperlipidemia    Hypertension    Lifelong obesity    Macular degeneration    Morbid obesity due to excess calories (HCC) 07/11/2015   Other fatigue 07/11/2015   Sleep apnea    Spinal headache    Past Surgical History:  Procedure Laterality Date   ABDOMINAL HYSTERECTOMY     APPENDECTOMY     CATARACT EXTRACTION     CERVICAL LAMINECTOMY  07/21/1985   CESAREAN SECTION     COLONOSCOPY  07/21/2012   diverticulosis, [facility] Dr. [name]    COLONOSCOPY WITH PROPOFOL N/A 02/04/2019   Procedure: COLONOSCOPY WITH PROPOFOL;  Surgeon: [name], MD;  Location: [facility] ENDOSCOPY;  Service: Endoscopy;  Laterality: N/A;   COLONOSCOPY WITH PROPOFOL N/A 03/18/2021   Procedure: COLONOSCOPY WITH PROPOFOL;  Surgeon: [name], MD;  Location: [facility] ENDOSCOPY;  Service: Endoscopy;  Laterality: N/A;  DM   DIAGNOSTIC LAPAROSCOPY     ESOPHAGOGASTRODUODENOSCOPY (EGD) WITH PROPOFOL N/A 02/04/2019   Procedure: ESOPHAGOGASTRODUODENOSCOPY (EGD) WITH PROPOFOL;  Surgeon:  [name], MD;  Location: [facility] ENDOSCOPY;  Service: Endoscopy;  Laterality: N/A;   ESOPHAGOGASTRODUODENOSCOPY (EGD) WITH PROPOFOL N/A 03/18/2021   Procedure: ESOPHAGOGASTRODUODENOSCOPY (EGD) WITH PROPOFOL;  Surgeon: [name], MD;  Location: [facility] ENDOSCOPY;  Service: Endoscopy;  Laterality: N/A;   LOOP RECORDER INSERTION N/A 08/05/2021   Procedure: LOOP RECORDER INSERTION;  Surgeon: [name], MD;  Location: [facility] INVASIVE CV LAB;  Service: Cardiovascular;  Laterality: N/A;   SPINE SURGERY     TUBAL LIGATION     Patient Active Problem List   Diagnosis Date Noted   UTI (urinary tract infection) 09/01/2022   COVID-19 virus infection 09/01/2022   Chronic diastolic CHF (congestive heart failure) (HCC) 09/01/2022   Chronic kidney disease, stage 3a (HCC) 09/01/2022   Obesity (BMI 30-39.9) 09/01/2022   Paroxysmal atrial fibrillation (HCC) 08/08/2022   Hypercoagulable state due to paroxysmal atrial fibrillation (HCC) 08/08/2022   Cerebral edema (HCC) 08/07/2021   OSA (obstructive sleep apnea) 08/07/2021   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   CVA (cerebral vascular accident) (HCC) 08/01/2021   GERD (gastroesophageal reflux disease) 07/20/2019   Advanced care planning/counseling discussion 12/17/2016   Skin lesions, generalized 11/13/2016   Chronic right hip pain 06/17/2016   Vitamin D deficiency 09/26/2015   Diastasis recti 08/08/2015   Elevated serum GGT  level 07/24/2015   Essential hypertension 07/17/2015   Fatty liver 07/17/2015   Elevated alkaline phosphatase level 07/17/2015   Elevated serum glutamic pyruvic transaminase (SGPT) level 07/17/2015   Abnormal finding on EKG 07/11/2015   Morbid obesity (HCC) 07/11/2015   Colon, diverticulosis 07/11/2015   Abdominal wall hernia 07/11/2015   DM (diabetes mellitus), type 2 with neurological complications (HCC)    Hyperlipidemia     PCP: [name], MD  REFERRING PROVIDER: [name], MD &  Edilia Bo, [place]  REFERRING DIAG: [phone] (ICD-10-CM) - Cerebral infarction due to unspecified occlusion or stenosis of unspecified precerebral arteries, Cervicalgia with degenerative disc  THERAPY DIAG:  Muscle weakness (generalized)  Pain in left hip  Cervical pain  Unsteadiness on feet  Apraxia  Right middle cerebral artery stroke (HCC)  Painful cervical ROM  Difficulty in walking, not elsewhere classified  Other lack of coordination  Rationale for Evaluation and Treatment: Rehabilitation  ONSET DATE: 09/03/22  SUBJECTIVE:                                                                                                                                                                                                         SUBJECTIVE STATEMENT:  Pt that she is still sleeping in bed. Reports mild discomfort in L hip and L shoulder on this day, but reports pain is not enough to rate from 1-10. States that support under L elbow reduces pain at night.   PERTINENT HISTORY:   Per chart and confirmed by pt: Pt is a 77 yo female with RMCA CVA infarction with hemorrhagic transformation 08/01/2021, with inpatient rehab 08/07/2021-08/22/2021 followed by [facility] therapy until June. Pt now currently being seen for outpatient OT and ST services. Pt reports limitations in stamina persist. Other PMH is significant for HTN, HLD, macular degeneration, DM, obesity, sleep apnea, arthritis, chronic R hip pain, vitamin D deficiency, diastasis recti, abdominal wall hernia, hx of abdominal hysterectomy, appendectomy, spine surgery (years ago).   PAIN:  Are you having pain? Yes: NPRS scale: 2/10 Pain location:  L hip/L knee  Pain description: dull ache Aggravating factors: sitting Relieving factors: laying down  PRECAUTIONS: None  WEIGHT BEARING RESTRICTIONS: No  FALLS:  Has patient fallen in last 6 months? Yes. Number of falls 2  LIVING ENVIRONMENT: Lives with: lives with their son Lives  in: House/apartment Stairs: Yes: Internal: 13 steps; doesn't go up to the second floor and External: 4 steps; can reach both Has following equipment at home: Single point cane and Walker - 2 wheeled  OCCUPATION: Retired  PLOF: Independent with basic ADLs  PATIENT GOALS: "Get stronger. And reduce fall risk. Reduce reliance on cane"  NEXT MD VISIT: end of May.   OBJECTIVE:   DIAGNOSTIC FINDINGS:    CT HEAD WITHOUT CONTRAST  CT CERVICAL SPINE WITHOUT CONTRAST IMPRESSION: 1. No acute intracranial pathology. 2. Expected interval evolution of encephalomalacia of the right frontal lobe, following intraparenchymal hemorrhage seen acutely on examination dated 08/03/2021. 3. No fracture or static subluxation of the cervical spine. 4. Generally mild disc space height loss and osteophytosis throughout the cervical spine, with partial ankylosis of C5-C6 and a large left eccentric posterior bridging osteophyte at this level. MRI may be used to further evaluate cervical disc and neural foraminal pathology if indicated by neurologically localizing signs and symptoms.  EXAM: xRay PELVIS - 1-2 VIEW  IMPRESSION: 1. No convincing evidence of acute fracture or diastasis of the pelvis. 2. Well corticated osseous focus beneath the right inferior pubic ramus may reflect sequela of avulsion injury or chronic tendinopathy, suggest correlation with direct tenderness to palpation.   PATIENT SURVEYS:  NDI 15/50 = 30% disability FOTO 65/68   10/23/2022 : 52/68  10/23/2022 Foto CVA 57/59  COGNITION: Overall cognitive status: History of cognitive impairments - at baseline  SENSATION: WFL  POSTURE: rounded shoulders and forward head  PALPATION:     CERVICAL ROM:   Active ROM A/PROM (deg) eval 3/21 4/4  Flexion 39 25 39  Extension 10 24 25   Right lateral flexion 7 15 15   Left lateral flexion 6 20 14   Right rotation 49 53 41  Left rotation 30 42 35   (Blank rows = not tested)  UPPER  EXTREMITY ROM:  Active ROM Right eval Left eval Right 4/4 Left 4/4  Shoulder flexion 128 81 149 22  Shoulder abduction 160 68  158  38   (Blank rows = not tested)   UPPER EXTREMITY MMT:  Grossly decreased (3+/5) in the L UE due to pain and lack of ROM   CERVICAL SPECIAL TESTS:  Spurling's test: Negative and Sharp pursor's test: Negative      LOWER EXTREMITY MMT:    MMT Right 3/21 Left 3/21  Hip flexion 4+ 4-  Hip extension 4+ 4-  Hip abduction 4+ 4  Hip adduction 4+ 4  Hip internal rotation    Hip external rotation    Knee flexion 4+ 4-  Knee extension 4+ 4  Ankle dorsiflexion 4+ 4  Ankle plantarflexion    Ankle inversion    Ankle eversion     (Blank rows = not tested)  LOWER EXTREMITY ROM:     Passive  Right 3/21 Left 3/21  Hip flexion 120 105  Hip extension    Hip abduction    Hip adduction    Hip internal rotation    Hip external rotation    Knee flexion [facility] WFL  Knee extension (in Hip flexion 90deg) 143 120  Ankle dorsiflexion    Ankle plantarflexion    Ankle inversion    Ankle eversion     (Blank rows = not tested)    LOWER EXTREMITY SPECIAL TESTS:  Hip special tests: Luisa Hart (FABER) test: positive , Hip scouring test: negative, and distraction: negative    FUNCTIONAL TESTS:  Not performed at this time.  TODAY'S TREATMENT: DATE: 11/25/22  PT instructed pt in endurance training to perform gait through hospital hall and cafeteria as well as over unlevel cement sidewalk including up/down ramp to and from side entrance to cancer center  performed >1073ft. RUE supported on SPC. Intermittent instruction for improved heel contact on the LLE throughout.   Sit<>supine with supervision assist to and from therapy table with improved use of LUE to push back into sitting from supine.   PT performed manual therapy for pain modulation of the LLE. Longitudinal distraction 2 x 30 sec. Piriformis stretch 2 x 30 sec. STM with trigger point release in TFL  and distal glute max x 5 minutes.   Supine therex:  SAQ x 10 Bil  Bridge x 5 Hip abduction x 8 bil,   SLR x 8 bil with AAROM on the LLE due to pain on this day.      PATIENT EDUCATION:  Education details: Pt educated throughout session about proper posture and technique with exercises. Improved exercise technique, movement at target joints, use of target muscles after min to mod verbal, visual, tactile cues.  Person educated: Patient and   Education method: Explanation Education comprehension: verbalized understanding  HOME EXERCISE PROGRAM:  Access Code: [email or web address] URL: [email or web address] Date: 09/18/2022 Prepared by: Tomasa Hose  Exercises - Seated Upper Trapezius Stretch  - 1 x daily - 7 x weekly - 3 sets - 10 reps - Seated Levator Scapulae Stretch  - 1 x daily - 7 x weekly - 3 sets - 10 reps - Seated Scapular Retraction  - 1 x daily - 7 x weekly - 3 sets - 10 reps  ASSESSMENT:  CLINICAL IMPRESSION: Continuing PT POC with focus on LLE strength and mobility.  Activity tolerance to gait in simulated community environment on this day as well as manual therapy for pain management on the LLE. Pt tolerated treatment well, but reports only slight improvement in L hip pain at end of PT treatment. Pt would benefit from skilled PT to continue to improve cervical ROM as well as address impairments of the LLE for pain management and improved strength and function to improve QoL and reduce dependence on husband as caregiver.       OBJECTIVE IMPAIRMENTS: decreased activity tolerance, decreased cognition, decreased ROM, decreased strength, decreased safety awareness, hypomobility, and pain.   ACTIVITY LIMITATIONS: carrying, lifting, bending, sitting, standing, squatting, and reach over head  PARTICIPATION LIMITATIONS: meal prep, cleaning, laundry, driving, shopping, community activity, and yard work  PERSONAL FACTORS: Age, Behavior pattern, Past/current experiences,  Time since onset of injury/illness/exacerbation, and 3+ comorbidities: arthritis, DM2, HTN, Obesity,   are also affecting patient's functional outcome.   REHAB POTENTIAL: Good  CLINICAL DECISION MAKING: Stable/uncomplicated  EVALUATION COMPLEXITY: Low   GOALS: Goals reviewed with patient? Yes  SHORT TERM GOALS: Target date: 12/11/2022    Pt will be independent with HEP in order to improve strength of BLE and ROM of the neck in order to return to PLOF. Baseline: provided on neck visit Goal status: INITIAL    LONG TERM GOALS: Target date: 01/22/2023    Patient will demonstrate improved function as evidenced by a score of 68 on FOTO measure for full participation in activities at home and in the community. Baseline: 65. 10/23/22: 52 Goal status: IN PROGRESS  2.  Pt to improve NDI by 5 points or 10% in order to demonstrate a reduction in overall disability caused by neck pain at this time. Baseline: 15/50 or 30% disability 4/11 27/50  Goal status: NOT MET  3.  Pt to demonstrate improved cervical rotation to 60 deg bilaterally in order to perform tasks around the house that require turning of the head. Baseline: R/L Cervical Rotation: 49/30.  4/424 R:53. L:42 Goal status: IN PROGRESS  4.  Pt to report 0/10 pain in the neck following light household chores at home in order to demonstrate an overall reduction in pain and an improvement in QoL. Baseline: 4/10 at rest 0/10 in neck Goal status: MET  4.  Pt will improve gait speed to <0.58m/s to indicate improved community mobility and reduced fall risk  Goal status: New.  initial.   5. Pt will increase 6 min walk test >1031ft   PLAN:  PT FREQUENCY: 1-2x/week  PT DURATION: 12 weeks  PLANNED INTERVENTIONS: Therapeutic exercises, Therapeutic activity, Neuromuscular re-education, Balance training, Gait training, Patient/Family education, Self Care, Joint mobilization, Joint manipulation, Stair training, Vestibular training, Canalith  repositioning, Visual/preceptual remediation/compensation, DME instructions, Dry Needling, Cognitive remediation, Electrical stimulation, Spinal manipulation, Spinal mobilization, Cryotherapy, Moist heat, Traction, Ultrasound, Manual therapy, and Re-evaluation    PLAN FOR NEXT SESSION:    Increased strengthening and dynamic balance training. Manual therapy as needed for pain management on LLE.    Grier Rocher PT, DPT  Physical Therapist - [place]  [facility]  1:27 PM 11/25/22

## 2022-11-25 NOTE — Progress Notes (Signed)
Carelink Summary Report / Loop Recorder 

## 2022-11-25 NOTE — Therapy (Signed)
Occupational Therapy Neuro Treatment Note   Patient Name: Alyssa Mayo MRN: [id number] DOB:1946/05/12, 77 y.o., female Today's Date: 03/14/2022   PCP: [name], MD REFERRING PROVIDER: [name], NP     OT End of Session - 11/25/22 1043     Visit Number 18    Number of Visits 24    Date for OT Re-Evaluation 12/09/22    Authorization Type Progress reporting period starting 09/16/2022    OT Start Time 1030    OT Stop Time 1100    OT Time Calculation (min) 30 min    Activity Tolerance Patient tolerated treatment well    Behavior During Therapy [facility] for tasks assessed/performed                                    Past Medical History:  Diagnosis Date   Abnormal levels of other serum enzymes 07/17/2015   Arthritis     Constipation 07/11/2015   COVID-19 03/06/2021   Diabetes mellitus without complication (HCC)     Elevated liver enzymes     Fatty liver     Generalized abdominal pain 07/11/2015   Hyperlipidemia     Hypertension     Lifelong obesity     Macular degeneration     Morbid obesity due to excess calories (HCC) 07/11/2015   Other fatigue 07/11/2015   Sleep apnea     Spinal headache           Past Surgical History:  Procedure Laterality Date   ABDOMINAL HYSTERECTOMY       APPENDECTOMY       CATARACT EXTRACTION       CERVICAL LAMINECTOMY   07/21/1985   CESAREAN SECTION       COLONOSCOPY   07/21/2012    diverticulosis, [facility] Dr. [name]    COLONOSCOPY WITH PROPOFOL N/A 02/04/2019    Procedure: COLONOSCOPY WITH PROPOFOL;  Surgeon: [name], MD;  Location: [facility] ENDOSCOPY;  Service: Endoscopy;  Laterality: N/A;   COLONOSCOPY WITH PROPOFOL N/A 03/18/2021    Procedure: COLONOSCOPY WITH PROPOFOL;  Surgeon: [name], MD;  Location: [facility] ENDOSCOPY;  Service: Endoscopy;  Laterality: N/A;  DM   DIAGNOSTIC LAPAROSCOPY       ESOPHAGOGASTRODUODENOSCOPY (EGD) WITH PROPOFOL N/A 02/04/2019    Procedure: ESOPHAGOGASTRODUODENOSCOPY (EGD)  WITH PROPOFOL;  Surgeon: [name], MD;  Location: [facility] ENDOSCOPY;  Service: Endoscopy;  Laterality: N/A;   ESOPHAGOGASTRODUODENOSCOPY (EGD) WITH PROPOFOL N/A 03/18/2021    Procedure: ESOPHAGOGASTRODUODENOSCOPY (EGD) WITH PROPOFOL;  Surgeon: [name], MD;  Location: [facility] ENDOSCOPY;  Service: Endoscopy;  Laterality: N/A;   LOOP RECORDER INSERTION N/A 08/05/2021    Procedure: LOOP RECORDER INSERTION;  Surgeon: [name], MD;  Location: [facility] INVASIVE CV LAB;  Service: Cardiovascular;  Laterality: N/A;   SPINE SURGERY       TUBAL LIGATION            Patient Active Problem List    Diagnosis Date Noted   Paroxysmal atrial fibrillation (HCC) 08/08/2022   Hypercoagulable state due to paroxysmal atrial fibrillation (HCC) 08/08/2022   Cerebral edema (HCC) 08/07/2021   OSA (obstructive sleep apnea) 08/07/2021   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   CVA (cerebral vascular accident) (HCC) 08/01/2021   GERD (gastroesophageal reflux disease) 07/20/2019   Advanced care planning/counseling discussion 12/17/2016   Skin lesions, generalized 11/13/2016   Chronic right hip pain 06/17/2016  Vitamin D deficiency 09/26/2015   Diastasis recti 08/08/2015   Elevated serum GGT level 07/24/2015   Essential hypertension 07/17/2015   Fatty liver 07/17/2015   Elevated alkaline phosphatase level 07/17/2015   Elevated serum glutamic pyruvic transaminase (SGPT) level 07/17/2015   Abnormal finding on EKG 07/11/2015   Morbid obesity (HCC) 07/11/2015   Colon, diverticulosis 07/11/2015   Abdominal wall hernia 07/11/2015   DM (diabetes mellitus), type 2 with neurological complications (HCC)     Hyperlipidemia        ONSET DATE: 08/01/2021   REFERRING DIAG: CVA   THERAPY DIAG:  Muscle weakness (generalized)   Rationale for Evaluation and Treatment Rehabilitation   SUBJECTIVE:    SUBJECTIVE STATEMENT:    Pt. was late for the session today. Pt. reports that she hasn't yet  heard the results from her X-ray.  Pt accompanied by: self   PERTINENT HISTORY:  Pt. is a 77 y.o. female who had a unexpectedly hospitalized from 2/12-2/14/2024 for COVID-19, and a UTI.  Pt. 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 Pt. attended inpatient rehab from 08/07/2021-08/22/2021. Pt. received Home health therapy services this past spring.  Pt. has recently had an assessment through driver rehabilitation services at [facility] with recommendations for referrals for  outpatient OT/PT, and ST services. Pt. PMHx includes: HTN, Hyperlipidemia, Macular degeneration, DM, and Obesity.   PRECAUTIONS: None   WEIGHT BEARING RESTRICTIONS No   PAIN:  Are you having pain?  .5/10   FALLS: Has patient fallen in last 6 months? Yes.   LIVING ENVIRONMENT: Lives with: lives with their family  Son  [name] Lives in: House/apartment Main living are on one floor Stairs:  2 steps to enter Has following equipment at home: Single point cane, Wheelchair (manual), shower chair, Grab bars, bed rail, and rubber mat.   PLOF: Independent   PATIENT GOALS: To be able to Drive, and cook again   OBJECTIVE:    HAND DOMINANCE: Right    ADLs:  Transfers/ambulation related to ADLs: Independent Eating: Independent Grooming: [employer] Pt. reports being unable to use her left hand to perform haircare.  UB Dressing: Independent pullover shirt, independent now with fastening a bra LB Dressing: Independent donning pants socks, and slide on shoes Toileting: Independent Bathing: Independent Tub Shower transfers: Walk-in shower Independent now     IADLs: Shopping: Needs to be accompanied to the grocery store. Light housekeeping: Do  Meal Prep:  Is able to perform light meal prep Community mobility: Relies on son, and friend Medication management: Son sets up Mining engineer management:TBD Handwriting: TBD   MOBILITY STATUS: Hx of falls out of bed      ACTIVITY TOLERANCE: Activity tolerance:  10-20 min. Before rest break   FUNCTIONAL OUTCOME MEASURES: FOTO: 61  TR score: 62  10/09/2022: FOTO: 49   UPPER EXTREMITY ROM      Active ROM Right eval Left eval Left  10/09/2022 Left 10/23/22  Shoulder flexion WFL 54(74) scaption 0(55) 26(80)  Shoulder abduction WFL 58(75) 32(54) 32(50)  Shoulder adduction        Shoulder extension        Shoulder internal rotation        Shoulder external rotation        Elbow flexion [facility] WFL 145 145  Elbow extension [facility] WFL 10(0) 10(0)  Wrist flexion WFL TBD [facility] WFL  Wrist extension WFL TBD [facility] WFL  Wrist ulnar deviation        Wrist radial deviation  Wrist pronation        Wrist supination        (Blank rows = not tested)     UPPER EXTREMITY MMT:      MMT Right eval Left eval Left 10/09/2022 Left 10/23/2022  Shoulder flexion 4+/5 2+/5 1/5 2-/5  Shoulder abduction 4+/5 2+/5 2-/5 2-/5  Shoulder adduction        Shoulder extension        Shoulder internal rotation        Shoulder external rotation        Middle trapezius        Lower trapezius        Elbow flexion 4+/5 3+/5 3/5 3/5  Elbow extension 4+/5 3+/5 3/5 3/5  Wrist flexion 4+/5 TBD 3/5 3/5  Wrist extension 4+/5 TBD 3/5 3/5  Wrist ulnar deviation        Wrist radial deviation        Wrist pronation        Wrist supination        (Blank rows = not tested)   HAND FUNCTION: Grip strength: Right: 33 lbs; Left: 11 lbs, Lateral pinch: Right: 17 lbs, Left: 2 lbs, and 3 point pinch: Right: 17 lbs, Left: 2 lbs  10/09/2022: Grip strength: Right: 33 lbs; Left: 10 lbs, Lateral pinch: Right: 17 lbs, Left: 1 lbs, and 3 point pinch: Right: 17 lbs, Left: 1 lbs   10/23/2022: Grip strength: Right: 33 lbs; Left: 16 lbs, Lateral pinch: Right: 17 lbs, Left: 1 lbs, and 3 point pinch: Right: 17 lbs, Left: 1 lbs   COORDINATION:   9 Hole Peg test: Right: 27 sec.  sec; Left: 52 sec.  10/09/2022: 9 Hole Peg test: Right: 27 sec.  sec; Left: 1  min. & 35 sec.   SENSATION: Light touch: WFL Proprioception: WFL   COGNITION: Overall cognitive status: Within functional limits for tasks assessed   VISION: Subjective report: Glasses. Just received a new prescription to increase bifocal strength Baseline vision: Macular Degeneration Visual history: macular degeneration   VISION ASSESSMENT: To be further assessed in functional context  Left sided Awareness      PERCEPTION: Limited left sided awareness     TODAY'S TREATMENT:    Therapeutic Exercise:   Pt. worked on BUE strengthening, and reciprocal motion using the UBE while seated for 8 min. with no resistance initially, and the resistance gradually increasing. Constant monitoring was provided.     Neuromuscular re-education:  Pt. performed left hand FMC tasks using the Grooved pegboard. Pt. worked on grasping the grooved pegs from a horizontal position, and moving the pegs to a vertical position in the hand to prepare for placing them in the grooved slot. Pt. worked on translatory movements moving objects through her hand from the palm to the tip of the 2nd digit through thumb.     PATIENT EDUCATION: Education details: Compensatory strategies during typing. Person educated: Patient Education method: verbal cues Education comprehension: verbalized understanding and pt does not plan to cook unless son is present.     HOME EXERCISE PROGRAM:  Reviewed home activities to enhance left hand digit extension  during typing skills.   GOALS: Goals reviewed with patient? Yes     SHORT TERM GOALS: Target date: 10/28/2022     Pt. Will improve FOTO score by 2 points to reflect improved pt. perceived functional performance Baseline: 10/23/2022: FOTO 49 10/09/2022: FOTO 49, FOTO: 61 TR score: 62  Goal status: INITIAL   LONG TERM  GOALS: Target date: 12/09/2022    Pt. Will improve left shoulder ROM by 10 degrees to be able able to efficiently apply deodorant Baseline: 10/23/2022:  Shoulder flexion: 26(80), Abduction: 32(50) 10/09/2022: flexion: 0(55), Abduction: 32(54) Goal status: Improving, but very limited.   2.  Pt. will be able to independently hold and use a blow dryer, and brush for hair care. Baseline: 10/23/2022: Pt. Continues to be unable to hold a blow dryer. Eval: Pt. Is unable to sustain her BUEs in elevation, and use a blow dryer, and brush. 10/09/2022: Pt. Is unable to hold a blow dryer. Eval: Pt. Is unable to sustain her BUEs in elevation, and use a blow dryer, and brush. Goal status: Ongoing   3.  Pt. Will improve left lateral pinch strength by 3# to be able to independently cut meat. Baseline: 10/23/2022: Pt. Continues to present with difficulty cutting meat. 10/09/2022: 1#  Eval: 2#. Pt. has difficulty stabilizing utensil, and food while cutting food. Goal status: Ongoing   4.  Pt. Will improve Left hand [facility] skills to be able to be able to independently, and efficiently manipulate small objects during ADL tasks. Baseline 10/23/2022: Pt. Continues to present with difficulty manipulating small objects. 10/09/2022: Left: 1 min & 35 sec.: Eval: Left: 52 sec. Right: 27 sec. Goal status: Ongoing     6.: Pt. will improve with left grip strength to be able to hold and pull up covers. Baseline: 10/23/2022: Left: 16# 10/09/2022: Left: 10# Eval:  Left grip strength 11#. Pt. has difficulty holding a full drink  securely with the left hand.             Goal status: Ongoing   7. Pt. Will improve typing speed, and accuracy in preparation for efficiently typing simple email message. Baseline: 10/23/2022: Pt. continues to present with limited speed, and accuracy with typing. 10/09/2022: Pt. presents with difficulty typing. Pt. presents with difficulty typing an email. Typing speed, and accuracy TBD             Goal status: INITIAL                                        CLINICAL IMPRESSION:  Pt. pain has improved in the LUE today to less than 1/10 (.5).  Pt. is improving  with left hand FMC, and translatory movements, however required cues to avoid hiking the left shoulder, as Pt. Compensates with hiking in the left shoulder during [facility] tasks.  Pt. continues to benefit from OT services to work towards improving Left UE functioning in order to maximize engagement in, and overall independence with ADLs, and IADL tasks.     PERFORMANCE DEFICITS in functional skills including ADLs, IADLs, coordination, dexterity, sensation, ROM, strength, FMC, decreased knowledge of use of DME, vision, and UE functional use, cognitive skills including attention, and psychosocial skills including coping strategies, environmental adaptation, habits, and routines and behaviors.    IMPAIRMENTS are limiting patient from ADLs, IADLs, and social participation.    COMORBIDITIES may have co-morbidities  that affects occupational performance. Patient will benefit from skilled OT to address above impairments and improve overall function.   MODIFICATION OR ASSISTANCE TO COMPLETE EVALUATION: Min-Moderate modification of tasks or assist with assess necessary to complete an evaluation.   OT OCCUPATIONAL PROFILE AND HISTORY: Detailed assessment: Review of records and additional review of physical, cognitive, psychosocial history related to current functional performance.  CLINICAL DECISION MAKING: Moderate - several treatment options, min-mod task modification necessary   REHAB POTENTIAL: Good   EVALUATION COMPLEXITY: Moderate      PLAN: OT FREQUENCY: 2x/week   OT DURATION: 12 weeks   PLANNED INTERVENTIONS: self care/ADL training, therapeutic exercise, therapeutic activity, neuromuscular re-education, manual therapy, passive range of motion, and paraffin   RECOMMENDED OTHER SERVICES: ST, and PT   CONSULTED AND AGREED WITH PLAN OF CARE: Patient   PLAN FOR NEXT SESSION:  L hand strengthening and coordination exercises    Olegario Messier, MS, OTR/L  11/25/22, 11:16 AM

## 2022-11-27 ENCOUNTER — Ambulatory Visit: Payer: Medicare HMO | Admitting: Occupational Therapy

## 2022-11-27 ENCOUNTER — Encounter: Payer: Self-pay | Admitting: Physical Therapy

## 2022-11-27 ENCOUNTER — Ambulatory Visit: Payer: Medicare HMO | Admitting: Physical Therapy

## 2022-11-27 DIAGNOSIS — M6281 Muscle weakness (generalized): Secondary | ICD-10-CM

## 2022-11-27 DIAGNOSIS — R2681 Unsteadiness on feet: Secondary | ICD-10-CM

## 2022-11-27 DIAGNOSIS — R278 Other lack of coordination: Secondary | ICD-10-CM

## 2022-11-27 DIAGNOSIS — M25552 Pain in left hip: Secondary | ICD-10-CM

## 2022-11-27 DIAGNOSIS — R262 Difficulty in walking, not elsewhere classified: Secondary | ICD-10-CM

## 2022-11-27 NOTE — Therapy (Signed)
OUTPATIENT PHYSICAL THERAPY  TREATMENT   Patient Name: Alyssa Mayo MRN: [id number] DOB:[birth year], 77 y.o., female Today's Date: 11/27/2022  END OF SESSION:   PT End of Session - 11/27/22 1128     Visit Number 19    Number of Visits 36    Date for PT Re-Evaluation 01/22/23    Authorization Type [employer]    Progress Note Due on Visit 20    PT Start Time 1145    PT Stop Time 1227    PT Time Calculation (min) 42 min    Equipment Utilized During Treatment Gait belt    Activity Tolerance Patient tolerated treatment well;No increased pain    Behavior During Therapy [facility] for tasks assessed/performed                   Past Medical History:  Diagnosis Date   Abnormal levels of other serum enzymes 07/17/2015   Arthritis    Constipation 07/11/2015   COVID-19 03/06/2021   Diabetes mellitus without complication (HCC)    Elevated liver enzymes    Fatty liver    Generalized abdominal pain 07/11/2015   Hyperlipidemia    Hypertension    Lifelong obesity    Macular degeneration    Morbid obesity due to excess calories (HCC) 07/11/2015   Other fatigue 07/11/2015   Sleep apnea    Spinal headache    Past Surgical History:  Procedure Laterality Date   ABDOMINAL HYSTERECTOMY     APPENDECTOMY     CATARACT EXTRACTION     CERVICAL LAMINECTOMY  07/21/1985   CESAREAN SECTION     COLONOSCOPY  07/21/2012   diverticulosis, [facility] Dr. [name]    COLONOSCOPY WITH PROPOFOL N/A 02/04/2019   Procedure: COLONOSCOPY WITH PROPOFOL;  Surgeon: [name], MD;  Location: [facility] ENDOSCOPY;  Service: Endoscopy;  Laterality: N/A;   COLONOSCOPY WITH PROPOFOL N/A 03/18/2021   Procedure: COLONOSCOPY WITH PROPOFOL;  Surgeon: [name], MD;  Location: [facility] ENDOSCOPY;  Service: Endoscopy;  Laterality: N/A;  DM   DIAGNOSTIC LAPAROSCOPY     ESOPHAGOGASTRODUODENOSCOPY (EGD) WITH PROPOFOL N/A 02/04/2019   Procedure: ESOPHAGOGASTRODUODENOSCOPY (EGD) WITH PROPOFOL;  Surgeon:  [name], MD;  Location: [facility] ENDOSCOPY;  Service: Endoscopy;  Laterality: N/A;   ESOPHAGOGASTRODUODENOSCOPY (EGD) WITH PROPOFOL N/A 03/18/2021   Procedure: ESOPHAGOGASTRODUODENOSCOPY (EGD) WITH PROPOFOL;  Surgeon: [name], MD;  Location: [facility] ENDOSCOPY;  Service: Endoscopy;  Laterality: N/A;   LOOP RECORDER INSERTION N/A 08/05/2021   Procedure: LOOP RECORDER INSERTION;  Surgeon: [name], MD;  Location: [facility] INVASIVE CV LAB;  Service: Cardiovascular;  Laterality: N/A;   SPINE SURGERY     TUBAL LIGATION     Patient Active Problem List   Diagnosis Date Noted   UTI (urinary tract infection) 09/01/2022   COVID-19 virus infection 09/01/2022   Chronic diastolic CHF (congestive heart failure) (HCC) 09/01/2022   Chronic kidney disease, stage 3a (HCC) 09/01/2022   Obesity (BMI 30-39.9) 09/01/2022   Paroxysmal atrial fibrillation (HCC) 08/08/2022   Hypercoagulable state due to paroxysmal atrial fibrillation (HCC) 08/08/2022   Cerebral edema (HCC) 08/07/2021   OSA (obstructive sleep apnea) 08/07/2021   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   CVA (cerebral vascular accident) (HCC) 08/01/2021   GERD (gastroesophageal reflux disease) 07/20/2019   Advanced care planning/counseling discussion 12/17/2016   Skin lesions, generalized 11/13/2016   Chronic right hip pain 06/17/2016   Vitamin D deficiency 09/26/2015   Diastasis recti 08/08/2015   Elevated serum GGT  level 07/24/2015   Essential hypertension 07/17/2015   Fatty liver 07/17/2015   Elevated alkaline phosphatase level 07/17/2015   Elevated serum glutamic pyruvic transaminase (SGPT) level 07/17/2015   Abnormal finding on EKG 07/11/2015   Morbid obesity (HCC) 07/11/2015   Colon, diverticulosis 07/11/2015   Abdominal wall hernia 07/11/2015   DM (diabetes mellitus), type 2 with neurological complications (HCC)    Hyperlipidemia     PCP: [name], MD  REFERRING PROVIDER: [name], MD &  Edilia Bo, [place]  REFERRING DIAG: [phone] (ICD-10-CM) - Cerebral infarction due to unspecified occlusion or stenosis of unspecified precerebral arteries, Cervicalgia with degenerative disc  THERAPY DIAG:  Muscle weakness (generalized)  Unsteadiness on feet  Difficulty in walking, not elsewhere classified  Pain in left hip  Rationale for Evaluation and Treatment: Rehabilitation  ONSET DATE: 09/03/22  SUBJECTIVE:                                                                                                                                                                                                         SUBJECTIVE STATEMENT:  Pt reports no falls or LOB since last visit. She said she is trying to give up falling but is having a hard time although no new falls.   PERTINENT HISTORY:   Per chart and confirmed by pt: Pt is a 77 yo female with RMCA CVA infarction with hemorrhagic transformation 08/01/2021, with inpatient rehab 08/07/2021-08/22/2021 followed [facility] until June. Pt now currently being seen for outpatient OT and ST services. Pt reports limitations in stamina persist. Other PMH is significant for HTN, HLD, macular degeneration, DM, obesity, sleep apnea, arthritis, chronic R hip pain, vitamin D deficiency, diastasis recti, abdominal wall hernia, hx of abdominal hysterectomy, appendectomy, spine surgery (years ago).   PAIN:  Are you having pain? Yes: NPRS scale: 2/10 Pain location:  L hip/L knee  Pain description: dull ache Aggravating factors: sitting Relieving factors: laying down  PRECAUTIONS: None  WEIGHT BEARING RESTRICTIONS: No  FALLS:  Has patient fallen in last 6 months? Yes. Number of falls 2  LIVING ENVIRONMENT: Lives with: lives with their son Lives in: House/apartment Stairs: Yes: Internal: 13 steps; doesn't go up to the second floor and External: 4 steps; can reach both Has following equipment at home: Single point cane and Walker - 2  wheeled  OCCUPATION: Retired  PLOF: Independent with basic ADLs  PATIENT GOALS: "Get stronger. And reduce fall risk. Reduce reliance on cane"  NEXT MD VISIT: end of May.   OBJECTIVE:   DIAGNOSTIC FINDINGS:  CT HEAD WITHOUT CONTRAST  CT CERVICAL SPINE WITHOUT CONTRAST IMPRESSION: 1. No acute intracranial pathology. 2. Expected interval evolution of encephalomalacia of the right frontal lobe, following intraparenchymal hemorrhage seen acutely on examination dated 08/03/2021. 3. No fracture or static subluxation of the cervical spine. 4. Generally mild disc space height loss and osteophytosis throughout the cervical spine, with partial ankylosis of C5-C6 and a large left eccentric posterior bridging osteophyte at this level. MRI may be used to further evaluate cervical disc and neural foraminal pathology if indicated by neurologically localizing signs and symptoms.  EXAM: xRay PELVIS - 1-2 VIEW  IMPRESSION: 1. No convincing evidence of acute fracture or diastasis of the pelvis. 2. Well corticated osseous focus beneath the right inferior pubic ramus may reflect sequela of avulsion injury or chronic tendinopathy, suggest correlation with direct tenderness to palpation.   PATIENT SURVEYS:  NDI 15/50 = 30% disability FOTO 65/68   10/23/2022 : 52/68  10/23/2022 Foto CVA 57/59  COGNITION: Overall cognitive status: History of cognitive impairments - at baseline  SENSATION: WFL  POSTURE: rounded shoulders and forward head  PALPATION:     CERVICAL ROM:   Active ROM A/PROM (deg) eval 3/21 4/4  Flexion 39 25 39  Extension 10 24 25   Right lateral flexion 7 15 15   Left lateral flexion 6 20 14   Right rotation 49 53 41  Left rotation 30 42 35   (Blank rows = not tested)  UPPER EXTREMITY ROM:  Active ROM Right eval Left eval Right 4/4 Left 4/4  Shoulder flexion 128 81 149 22  Shoulder abduction 160 68  158  38   (Blank rows = not tested)   UPPER EXTREMITY  MMT:  Grossly decreased (3+/5) in the L UE due to pain and lack of ROM   CERVICAL SPECIAL TESTS:  Spurling's test: Negative and Sharp pursor's test: Negative      LOWER EXTREMITY MMT:    MMT Right 3/21 Left 3/21  Hip flexion 4+ 4-  Hip extension 4+ 4-  Hip abduction 4+ 4  Hip adduction 4+ 4  Hip internal rotation    Hip external rotation    Knee flexion 4+ 4-  Knee extension 4+ 4  Ankle dorsiflexion 4+ 4  Ankle plantarflexion    Ankle inversion    Ankle eversion     (Blank rows = not tested)  LOWER EXTREMITY ROM:     Passive  Right 3/21 Left 3/21  Hip flexion 120 105  Hip extension    Hip abduction    Hip adduction    Hip internal rotation    Hip external rotation    Knee flexion [facility] WFL  Knee extension (in Hip flexion 90deg) 143 120  Ankle dorsiflexion    Ankle plantarflexion    Ankle inversion    Ankle eversion     (Blank rows = not tested)    LOWER EXTREMITY SPECIAL TESTS:  Hip special tests: Luisa Hart (FABER) test: positive , Hip scouring test: negative, and distraction: negative    FUNCTIONAL TESTS:  Not performed at this time.  TODAY'S TREATMENT: DATE: 11/27/22 TE Nustep level 1 x 6 min with LE only, focus on SPM around 50, difficulty keeping this pace   Forward step up to 6inch step x 10 Bil  Lateral step up on 6inch step x8 bil   Cues for full ROM and posture throughout to reduce stress on knees.   TE  LAQ x 3# AW 10 Bil   Standing on airex  with UE support  -Hip abduction x 10 bil with 3# AW  -Hip flexion/ march x 10 Bil 3# AW - attempted hip ext but causes low back discomfort so transitioned to seated    Seated BTB gluteal press down x 10 ea LE    NMR Dynamic balance airex step up / down laterally x 10 ea LE  - forward/ back x 10 ea LE   Balance obstacles focusing on foot clearance with 2 1/2 bolsters, a 6 inch step and a orange hurdle.  X 6 times through.   Unless otherwise stated, CGA was provided and gait belt donned in  order to ensure pt safety    PATIENT EDUCATION:  Education details: Pt educated throughout session about proper posture and technique with exercises. Improved exercise technique, movement at target joints, use of target muscles after min to mod verbal, visual, tactile cues.  Person educated: Patient and   Education method: Explanation Education comprehension: verbalized understanding  HOME EXERCISE PROGRAM:  Access Code: [email or web address] URL: [email or web address] Date: 09/18/2022 Prepared by: Tomasa Hose  Exercises - Seated Upper Trapezius Stretch  - 1 x daily - 7 x weekly - 3 sets - 10 reps - Seated Levator Scapulae Stretch  - 1 x daily - 7 x weekly - 3 sets - 10 reps - Seated Scapular Retraction  - 1 x daily - 7 x weekly - 3 sets - 10 reps  ASSESSMENT:  CLINICAL IMPRESSION: Patient presents with good motivation for completion of physical therapy activities.  Patient reports initially not having hip pain at this time and overall is feeling well today.  Physical therapy session today consisted mostly of improving hip strength as well as improving her balance and functional mobility including stair navigation and dynamic balance activities focusing on foot clearance.  Patient tolerates all treatments well with exception of attempts at hip extension where she has some pain in standing position in her low back.  When transition to seated to work on gluteal muscle activation patient pain was not present with this activity.Pt will continue to benefit from skilled physical therapy intervention to address impairments, improve QOL, and attain therapy goals.       OBJECTIVE IMPAIRMENTS: decreased activity tolerance, decreased cognition, decreased ROM, decreased strength, decreased safety awareness, hypomobility, and pain.   ACTIVITY LIMITATIONS: carrying, lifting, bending, sitting, standing, squatting, and reach over head  PARTICIPATION LIMITATIONS: meal prep, cleaning, laundry,  driving, shopping, community activity, and yard work  PERSONAL FACTORS: Age, Behavior pattern, Past/current experiences, Time since onset of injury/illness/exacerbation, and 3+ comorbidities: arthritis, DM2, HTN, Obesity,   are also affecting patient's functional outcome.   REHAB POTENTIAL: Good  CLINICAL DECISION MAKING: Stable/uncomplicated  EVALUATION COMPLEXITY: Low   GOALS: Goals reviewed with patient? Yes  SHORT TERM GOALS: Target date: 12/11/2022    Pt will be independent with HEP in order to improve strength of BLE and ROM of the neck in order to return to PLOF. Baseline: provided on neck visit Goal status: INITIAL    LONG TERM GOALS: Target date: 01/22/2023    Patient will demonstrate improved function as evidenced by a score of 68 on FOTO measure for full participation in activities at home and in the community. Baseline: 65. 10/23/22: 52 Goal status: IN PROGRESS  2.  Pt to improve NDI by 5 points or 10% in order to demonstrate a reduction in overall disability caused by neck pain at this time. Baseline: 15/50 or 30% disability 4/11 27/50  Goal status: NOT MET  3.  Pt to demonstrate improved cervical rotation to 60 deg bilaterally in order to perform tasks around the house that require turning of the head. Baseline: R/L Cervical Rotation: 49/30. 4/424 R:53. L:42 Goal status: IN PROGRESS  4.  Pt to report 0/10 pain in the neck following light household chores at home in order to demonstrate an overall reduction in pain and an improvement in QoL. Baseline: 4/10 at rest 0/10 in neck Goal status: MET  4.  Pt will improve gait speed to <0.28m/s to indicate improved community mobility and reduced fall risk  Goal status: New.  initial.   5. Pt will increase 6 min walk test >1087ft   PLAN:  PT FREQUENCY: 1-2x/week  PT DURATION: 12 weeks  PLANNED INTERVENTIONS: Therapeutic exercises, Therapeutic activity, Neuromuscular re-education, Balance training, Gait training,  Patient/Family education, Self Care, Joint mobilization, Joint manipulation, Stair training, Vestibular training, Canalith repositioning, Visual/preceptual remediation/compensation, DME instructions, Dry Needling, Cognitive remediation, Electrical stimulation, Spinal manipulation, Spinal mobilization, Cryotherapy, Moist heat, Traction, Ultrasound, Manual therapy, and Re-evaluation    PLAN FOR NEXT SESSION:    Increased strengthening and dynamic balance training. Manual therapy as needed for pain management on LLE.    Norman Herrlich PT ,DPT Physical Therapist- [facility]   11:29 AM 11/27/22

## 2022-11-27 NOTE — Therapy (Addendum)
Occupational Therapy Neuro Treatment Note   Patient Name: Alyssa Mayo MRN: [id number] DOB:1945/12/06, 77 y.o., female Today's Date: 03/14/2022   PCP: [name], MD REFERRING PROVIDER: [name], NP     OT End of Session - 11/27/22 1123     Visit Number 19    Number of Visits 24    Date for OT Re-Evaluation 12/09/22    OT Start Time 1100    OT Stop Time 1145    OT Time Calculation (min) 45 min    Activity Tolerance Patient tolerated treatment well    Behavior During Therapy [facility] for tasks assessed/performed                                    Past Medical History:  Diagnosis Date   Abnormal levels of other serum enzymes 07/17/2015   Arthritis     Constipation 07/11/2015   COVID-19 03/06/2021   Diabetes mellitus without complication (HCC)     Elevated liver enzymes     Fatty liver     Generalized abdominal pain 07/11/2015   Hyperlipidemia     Hypertension     Lifelong obesity     Macular degeneration     Morbid obesity due to excess calories (HCC) 07/11/2015   Other fatigue 07/11/2015   Sleep apnea     Spinal headache           Past Surgical History:  Procedure Laterality Date   ABDOMINAL HYSTERECTOMY       APPENDECTOMY       CATARACT EXTRACTION       CERVICAL LAMINECTOMY   07/21/1985   CESAREAN SECTION       COLONOSCOPY   07/21/2012    diverticulosis, [facility] Dr. [name]    COLONOSCOPY WITH PROPOFOL N/A 02/04/2019    Procedure: COLONOSCOPY WITH PROPOFOL;  Surgeon: [name], MD;  Location: [facility] ENDOSCOPY;  Service: Endoscopy;  Laterality: N/A;   COLONOSCOPY WITH PROPOFOL N/A 03/18/2021    Procedure: COLONOSCOPY WITH PROPOFOL;  Surgeon: [name], MD;  Location: [facility] ENDOSCOPY;  Service: Endoscopy;  Laterality: N/A;  DM   DIAGNOSTIC LAPAROSCOPY       ESOPHAGOGASTRODUODENOSCOPY (EGD) WITH PROPOFOL N/A 02/04/2019    Procedure: ESOPHAGOGASTRODUODENOSCOPY (EGD) WITH PROPOFOL;  Surgeon: [name], MD;  Location: [facility]  ENDOSCOPY;  Service: Endoscopy;  Laterality: N/A;   ESOPHAGOGASTRODUODENOSCOPY (EGD) WITH PROPOFOL N/A 03/18/2021    Procedure: ESOPHAGOGASTRODUODENOSCOPY (EGD) WITH PROPOFOL;  Surgeon: [name], MD;  Location: [facility] ENDOSCOPY;  Service: Endoscopy;  Laterality: N/A;   LOOP RECORDER INSERTION N/A 08/05/2021    Procedure: LOOP RECORDER INSERTION;  Surgeon: [name], MD;  Location: [facility] INVASIVE CV LAB;  Service: Cardiovascular;  Laterality: N/A;   SPINE SURGERY       TUBAL LIGATION            Patient Active Problem List    Diagnosis Date Noted   Paroxysmal atrial fibrillation (HCC) 08/08/2022   Hypercoagulable state due to paroxysmal atrial fibrillation (HCC) 08/08/2022   Cerebral edema (HCC) 08/07/2021   OSA (obstructive sleep apnea) 08/07/2021   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   CVA (cerebral vascular accident) (HCC) 08/01/2021   GERD (gastroesophageal reflux disease) 07/20/2019   Advanced care planning/counseling discussion 12/17/2016   Skin lesions, generalized 11/13/2016   Chronic right hip pain 06/17/2016   Vitamin D deficiency 09/26/2015   Diastasis recti 08/08/2015  Elevated serum GGT level 07/24/2015   Essential hypertension 07/17/2015   Fatty liver 07/17/2015   Elevated alkaline phosphatase level 07/17/2015   Elevated serum glutamic pyruvic transaminase (SGPT) level 07/17/2015   Abnormal finding on EKG 07/11/2015   Morbid obesity (HCC) 07/11/2015   Colon, diverticulosis 07/11/2015   Abdominal wall hernia 07/11/2015   DM (diabetes mellitus), type 2 with neurological complications (HCC)     Hyperlipidemia        ONSET DATE: 08/01/2021   REFERRING DIAG: CVA   THERAPY DIAG:  Muscle weakness (generalized)   Rationale for Evaluation and Treatment Rehabilitation   SUBJECTIVE:    SUBJECTIVE STATEMENT:    Pt. reports planning to visit her son in [place] over Memorial Day weekend  Pt accompanied by: self   PERTINENT HISTORY:  Pt.  is a 77 y.o. female who had a unexpectedly hospitalized from 2/12-2/14/2024 for COVID-19, and a UTI.  Pt. 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 Pt. attended inpatient rehab from 08/07/2021-08/22/2021. Pt. received Home health therapy services this past spring.  Pt. has recently had an assessment through driver rehabilitation services at [facility] with recommendations for referrals for  outpatient OT/PT, and ST services. Pt. PMHx includes: HTN, Hyperlipidemia, Macular degeneration, DM, and Obesity.   PRECAUTIONS: None   WEIGHT BEARING RESTRICTIONS No   PAIN:  Are you having pain?  No   FALLS: Has patient fallen in last 6 months? Yes.   LIVING ENVIRONMENT: Lives with: lives with their family  Son  [name] Lives in: House/apartment Main living are on one floor Stairs:  2 steps to enter Has following equipment at home: Single point cane, Wheelchair (manual), shower chair, Grab bars, bed rail, and rubber mat.   PLOF: Independent   PATIENT GOALS: To be able to Drive, and cook again   OBJECTIVE:    HAND DOMINANCE: Right    ADLs:  Transfers/ambulation related to ADLs: Independent Eating: Independent Grooming: [employer] Pt. reports being unable to use her left hand to perform haircare.  UB Dressing: Independent pullover shirt, independent now with fastening a bra LB Dressing: Independent donning pants socks, and slide on shoes Toileting: Independent Bathing: Independent Tub Shower transfers: Walk-in shower Independent now     IADLs: Shopping: Needs to be accompanied to the grocery store. Light housekeeping: Do  Meal Prep:  Is able to perform light meal prep Community mobility: Relies on son, and friend Medication management: Son sets up Mining engineer management:TBD Handwriting: TBD   MOBILITY STATUS: Hx of falls out of bed     ACTIVITY TOLERANCE: Activity tolerance:  10-20 min. Before rest break   FUNCTIONAL OUTCOME  MEASURES: FOTO: 61  TR score: 62  10/09/2022: FOTO: 49   UPPER EXTREMITY ROM      Active ROM Right eval Left eval Left  10/09/2022 Left 10/23/22  Shoulder flexion WFL 54(74) scaption 0(55) 26(80)  Shoulder abduction WFL 58(75) 32(54) 32(50)  Shoulder adduction        Shoulder extension        Shoulder internal rotation        Shoulder external rotation        Elbow flexion [facility] WFL 145 145  Elbow extension [facility] WFL 10(0) 10(0)  Wrist flexion WFL TBD [facility] WFL  Wrist extension WFL TBD [facility] WFL  Wrist ulnar deviation        Wrist radial deviation        Wrist pronation        Wrist  supination        (Blank rows = not tested)     UPPER EXTREMITY MMT:      MMT Right eval Left eval Left 10/09/2022 Left 10/23/2022  Shoulder flexion 4+/5 2+/5 1/5 2-/5  Shoulder abduction 4+/5 2+/5 2-/5 2-/5  Shoulder adduction        Shoulder extension        Shoulder internal rotation        Shoulder external rotation        Middle trapezius        Lower trapezius        Elbow flexion 4+/5 3+/5 3/5 3/5  Elbow extension 4+/5 3+/5 3/5 3/5  Wrist flexion 4+/5 TBD 3/5 3/5  Wrist extension 4+/5 TBD 3/5 3/5  Wrist ulnar deviation        Wrist radial deviation        Wrist pronation        Wrist supination        (Blank rows = not tested)   HAND FUNCTION: Grip strength: Right: 33 lbs; Left: 11 lbs, Lateral pinch: Right: 17 lbs, Left: 2 lbs, and 3 point pinch: Right: 17 lbs, Left: 2 lbs  10/09/2022: Grip strength: Right: 33 lbs; Left: 10 lbs, Lateral pinch: Right: 17 lbs, Left: 1 lbs, and 3 point pinch: Right: 17 lbs, Left: 1 lbs   10/23/2022: Grip strength: Right: 33 lbs; Left: 16 lbs, Lateral pinch: Right: 17 lbs, Left: 1 lbs, and 3 point pinch: Right: 17 lbs, Left: 1 lbs   COORDINATION:   9 Hole Peg test: Right: 27 sec.  sec; Left: 52 sec.  10/09/2022: 9 Hole Peg test: Right: 27 sec.  sec; Left: 1 min. & 35 sec.   SENSATION: Light touch: WFL Proprioception: WFL   COGNITION: Overall  cognitive status: Within functional limits for tasks assessed   VISION: Subjective report: Glasses. Just received a new prescription to increase bifocal strength Baseline vision: Macular Degeneration Visual history: macular degeneration   VISION ASSESSMENT: To be further assessed in functional context  Left sided Awareness      PERCEPTION: Limited left sided awareness     TODAY'S TREATMENT:    Therapeutic Exercise:   Pt. tolerated PROM in all joint ranges of the left shoulder. Pt. worked on [employer], and reciprocal motion using the UBE while seated for 8 min. with no resistance initially, and the resistance gradually increasing. Constant monitoring was provided.    Manual therapy:  Pt. tolerated soft tissue massage to the left scapular musculature, and the left upper arm. Manual therapy was performed independent of, and in preparation for therapeutic Ex.     Neuromuscular re-education:  Pt. worked on [facility] skills grasping 1" sticks. Pt. worked on storing the objects in the palm, and translatory skills moving the items from the palm of the hand to the tip of the 2nd digit, and thumb. Pt. worked on removing the pegs using bilateral alternating hand patterns.      PATIENT EDUCATION: Education details: Compensatory strategies during typing. Person educated: Patient Education method: verbal cues Education comprehension: verbalized understanding and pt does not plan to cook unless son is present.     HOME EXERCISE PROGRAM:  Reviewed home activities to enhance left hand digit extension  during typing skills.   GOALS: Goals reviewed with patient? Yes     SHORT TERM GOALS: Target date: 10/28/2022     Pt. Will improve FOTO score by 2 points to reflect improved pt. perceived functional  performance Baseline: 10/23/2022: FOTO 49 10/09/2022: FOTO 49, FOTO: 61 TR score: 62  Goal status: INITIAL   LONG TERM GOALS: Target date: 12/09/2022    Pt. Will improve left shoulder ROM  by 10 degrees to be able able to efficiently apply deodorant Baseline: 10/23/2022: Shoulder flexion: 26(80), Abduction: 32(50) 10/09/2022: flexion: 0(55), Abduction: 32(54) Goal status: Improving, but very limited.   2.  Pt. will be able to independently hold and use a blow dryer, and brush for hair care. Baseline: 10/23/2022: Pt. Continues to be unable to hold a blow dryer. Eval: Pt. Is unable to sustain her BUEs in elevation, and use a blow dryer, and brush. 10/09/2022: Pt. Is unable to hold a blow dryer. Eval: Pt. Is unable to sustain her BUEs in elevation, and use a blow dryer, and brush. Goal status: Ongoing   3.  Pt. Will improve left lateral pinch strength by 3# to be able to independently cut meat. Baseline: 10/23/2022: Pt. Continues to present with difficulty cutting meat. 10/09/2022: 1#  Eval: 2#. Pt. has difficulty stabilizing utensil, and food while cutting food. Goal status: Ongoing   4.  Pt. Will improve Left hand [facility] skills to be able to be able to independently, and efficiently manipulate small objects during ADL tasks. Baseline 10/23/2022: Pt. Continues to present with difficulty manipulating small objects. 10/09/2022: Left: 1 min & 35 sec.: Eval: Left: 52 sec. Right: 27 sec. Goal status: Ongoing     6.: Pt. will improve with left grip strength to be able to hold and pull up covers. Baseline: 10/23/2022: Left: 16# 10/09/2022: Left: 10# Eval:  Left grip strength 11#. Pt. has difficulty holding a full drink  securely with the left hand.             Goal status: Ongoing   7. Pt. Will improve typing speed, and accuracy in preparation for efficiently typing simple email message. Baseline: 10/23/2022: Pt. continues to present with limited speed, and accuracy with typing. 10/09/2022: Pt. presents with difficulty typing. Pt. presents with difficulty typing an email. Typing speed, and accuracy TBD             Goal status: INITIAL                                        CLINICAL  IMPRESSION:  Pt. reports no pain today however reports tightness in the LUE. Pt. is improving with left hand FMC, and has difficulty at times manipulating small items.  Pt. required fewer cues to avoid hiking the left shoulder, as Pt. compensates with hiking in the left shoulder during [facility] tasks. Pt. continues to benefit from OT services to work towards improving Left UE functioning in order to maximize engagement in, and overall independence with ADLs, and IADL tasks.     PERFORMANCE DEFICITS in functional skills including ADLs, IADLs, coordination, dexterity, sensation, ROM, strength, FMC, decreased knowledge of use of DME, vision, and UE functional use, cognitive skills including attention, and psychosocial skills including coping strategies, environmental adaptation, habits, and routines and behaviors.    IMPAIRMENTS are limiting patient from ADLs, IADLs, and social participation.    COMORBIDITIES may have co-morbidities  that affects occupational performance. Patient will benefit from skilled OT to address above impairments and improve overall function.   MODIFICATION OR ASSISTANCE TO COMPLETE EVALUATION: Min-Moderate modification of tasks or assist with assess necessary to complete an evaluation.  OT OCCUPATIONAL PROFILE AND HISTORY: Detailed assessment: Review of records and additional review of physical, cognitive, psychosocial history related to current functional performance.   CLINICAL DECISION MAKING: Moderate - several treatment options, min-mod task modification necessary   REHAB POTENTIAL: Good   EVALUATION COMPLEXITY: Moderate      PLAN: OT FREQUENCY: 2x/week   OT DURATION: 12 weeks   PLANNED INTERVENTIONS: self care/ADL training, therapeutic exercise, therapeutic activity, neuromuscular re-education, manual therapy, passive range of motion, and paraffin   RECOMMENDED OTHER SERVICES: ST, and PT   CONSULTED AND AGREED WITH PLAN OF CARE: Patient   PLAN FOR NEXT  SESSION:  L hand strengthening and coordination exercises    Olegario Messier, MS, OTR/L  11/27/22, 11:36 AM

## 2022-12-02 ENCOUNTER — Ambulatory Visit: Payer: Medicare HMO | Admitting: Physical Therapy

## 2022-12-02 ENCOUNTER — Ambulatory Visit: Payer: Medicare HMO | Admitting: Occupational Therapy

## 2022-12-02 DIAGNOSIS — R2681 Unsteadiness on feet: Secondary | ICD-10-CM

## 2022-12-02 DIAGNOSIS — R262 Difficulty in walking, not elsewhere classified: Secondary | ICD-10-CM

## 2022-12-02 DIAGNOSIS — R278 Other lack of coordination: Secondary | ICD-10-CM

## 2022-12-02 DIAGNOSIS — M6281 Muscle weakness (generalized): Secondary | ICD-10-CM

## 2022-12-02 DIAGNOSIS — M542 Cervicalgia: Secondary | ICD-10-CM

## 2022-12-02 DIAGNOSIS — M25552 Pain in left hip: Secondary | ICD-10-CM

## 2022-12-02 NOTE — Therapy (Addendum)
Occupational Therapy Progress/Recertification Note  Dates of reporting period  10/23/2022   to   12/02/2022    Patient Name: Alyssa Mayo MRN: [id number] DOB:[birth year], 77 y.o., female Today's Date: 03/14/2022   PCP: [name], MD REFERRING PROVIDER: [name], NP     OT End of Session - 12/02/22 1051     Visit Number 20    Date for OT Re-Evaluation 02/24/23    OT Start Time 1018    OT Stop Time 1100    OT Time Calculation (min) 42 min    Activity Tolerance Patient tolerated treatment well    Behavior During Therapy [facility] for tasks assessed/performed                                    Past Medical History:  Diagnosis Date   Abnormal levels of other serum enzymes 07/17/2015   Arthritis     Constipation 07/11/2015   COVID-19 03/06/2021   Diabetes mellitus without complication (HCC)     Elevated liver enzymes     Fatty liver     Generalized abdominal pain 07/11/2015   Hyperlipidemia     Hypertension     Lifelong obesity     Macular degeneration     Morbid obesity due to excess calories (HCC) 07/11/2015   Other fatigue 07/11/2015   Sleep apnea     Spinal headache           Past Surgical History:  Procedure Laterality Date   ABDOMINAL HYSTERECTOMY       APPENDECTOMY       CATARACT EXTRACTION       CERVICAL LAMINECTOMY   07/21/1985   CESAREAN SECTION       COLONOSCOPY   07/21/2012    diverticulosis, [facility] Dr. [name]    COLONOSCOPY WITH PROPOFOL N/A 02/04/2019    Procedure: COLONOSCOPY WITH PROPOFOL;  Surgeon: [name], MD;  Location: [facility] ENDOSCOPY;  Service: Endoscopy;  Laterality: N/A;   COLONOSCOPY WITH PROPOFOL N/A 03/18/2021    Procedure: COLONOSCOPY WITH PROPOFOL;  Surgeon: [name], MD;  Location: [facility] ENDOSCOPY;  Service: Endoscopy;  Laterality: N/A;  DM   DIAGNOSTIC LAPAROSCOPY       ESOPHAGOGASTRODUODENOSCOPY (EGD) WITH PROPOFOL N/A 02/04/2019    Procedure: ESOPHAGOGASTRODUODENOSCOPY (EGD) WITH PROPOFOL;  Surgeon:  [name], MD;  Location: [facility] ENDOSCOPY;  Service: Endoscopy;  Laterality: N/A;   ESOPHAGOGASTRODUODENOSCOPY (EGD) WITH PROPOFOL N/A 03/18/2021    Procedure: ESOPHAGOGASTRODUODENOSCOPY (EGD) WITH PROPOFOL;  Surgeon: [name], MD;  Location: [facility] ENDOSCOPY;  Service: Endoscopy;  Laterality: N/A;   LOOP RECORDER INSERTION N/A 08/05/2021    Procedure: LOOP RECORDER INSERTION;  Surgeon: [name], MD;  Location: [facility] INVASIVE CV LAB;  Service: Cardiovascular;  Laterality: N/A;   SPINE SURGERY       TUBAL LIGATION            Patient Active Problem List    Diagnosis Date Noted   Paroxysmal atrial fibrillation (HCC) 08/08/2022   Hypercoagulable state due to paroxysmal atrial fibrillation (HCC) 08/08/2022   Cerebral edema (HCC) 08/07/2021   OSA (obstructive sleep apnea) 08/07/2021   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   CVA (cerebral vascular accident) (HCC) 08/01/2021   GERD (gastroesophageal reflux disease) 07/20/2019   Advanced care planning/counseling discussion 12/17/2016   Skin lesions, generalized 11/13/2016   Chronic right hip pain 06/17/2016   Vitamin D deficiency  09/26/2015   Diastasis recti 08/08/2015   Elevated serum GGT level 07/24/2015   Essential hypertension 07/17/2015   Fatty liver 07/17/2015   Elevated alkaline phosphatase level 07/17/2015   Elevated serum glutamic pyruvic transaminase (SGPT) level 07/17/2015   Abnormal finding on EKG 07/11/2015   Morbid obesity (HCC) 07/11/2015   Colon, diverticulosis 07/11/2015   Abdominal wall hernia 07/11/2015   DM (diabetes mellitus), type 2 with neurological complications (HCC)     Hyperlipidemia        ONSET DATE: 08/01/2021   REFERRING DIAG: CVA   THERAPY DIAG:  Muscle weakness (generalized)   Rationale for Evaluation and Treatment Rehabilitation   SUBJECTIVE:    SUBJECTIVE STATEMENT:    Pt. reports planning to visit her son in [place] over Memorial Day weekend, and plans to  take a train back.  Pt accompanied by: self   PERTINENT HISTORY:  Pt. is a 77 y.o. female who had a unexpectedly hospitalized from 2/12-2/14/2024 for COVID-19, and a UTI.  Pt. 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 Pt. attended inpatient rehab from 08/07/2021-08/22/2021. Pt. received Home health therapy services this past spring.  Pt. has recently had an assessment through driver rehabilitation services at [facility] with recommendations for referrals for  outpatient OT/PT, and ST services. Pt. PMHx includes: HTN, Hyperlipidemia, Macular degeneration, DM, and Obesity.   PRECAUTIONS: None   WEIGHT BEARING RESTRICTIONS No   PAIN:  Are you having pain?  1/10 pain   FALLS: Has patient fallen in last 6 months? Yes.   LIVING ENVIRONMENT: Lives with: lives with their family  Son  [name] Lives in: House/apartment Main living are on one floor Stairs:  2 steps to enter Has following equipment at home: Single point cane, Wheelchair (manual), shower chair, Grab bars, bed rail, and rubber mat.   PLOF: Independent   PATIENT GOALS: To be able to Drive, and cook again   OBJECTIVE:    HAND DOMINANCE: Right    ADLs:  Transfers/ambulation related to ADLs: Independent Eating: Independent Grooming: [employer] Pt. reports being unable to use her left hand to perform haircare.  UB Dressing: Independent pullover shirt, independent now with fastening a bra LB Dressing: Independent donning pants socks, and slide on shoes Toileting: Independent Bathing: Independent Tub Shower transfers: Walk-in shower Independent now     IADLs: Shopping: Needs to be accompanied to the grocery store. Light housekeeping: Do  Meal Prep:  Is able to perform light meal prep Community mobility: Relies on son, and friend Medication management: Son sets up Mining engineer management:TBD Handwriting: TBD   MOBILITY STATUS: Hx of falls out of bed     ACTIVITY  TOLERANCE: Activity tolerance:  10-20 min. Before rest break   FUNCTIONAL OUTCOME MEASURES: FOTO: 61  TR score: 62  10/09/2022: FOTO: 49   UPPER EXTREMITY ROM      Active ROM Right eval Left eval Left  10/09/2022 Left 10/23/22 Left 12/02/2022  Shoulder flexion WFL 54(74) scaption 0(55) 26(80) 32(82)  Shoulder abduction WFL 58(75) 32(54) 32(50) 45(60)  Shoulder adduction         Shoulder extension         Shoulder internal rotation         Shoulder external rotation         Elbow flexion [facility] WFL 145 145 145  Elbow extension [facility] WFL 10(0) 10(0) WFL  Wrist flexion WFL TBD [facility] WFL  Wrist extension WFL TBD [facility] WFL  Wrist ulnar  deviation         Wrist radial deviation         Wrist pronation         Wrist supination         (Blank rows = not tested)     UPPER EXTREMITY MMT:      MMT Right eval Left eval Left 10/09/2022 Left 10/23/2022 Left 12/02/2022  Shoulder flexion 4+/5 2+/5 1/5 2-/5 2-/5  Shoulder abduction 4+/5 2+/5 2-/5 2-/5 2-/5  Shoulder adduction         Shoulder extension         Shoulder internal rotation         Shoulder external rotation         Middle trapezius         Lower trapezius         Elbow flexion 4+/5 3+/5 3/5 3/5 3+/5  Elbow extension 4+/5 3+/5 3/5 3/5 3+/5  Wrist flexion 4+/5 TBD 3/5 3/5 3+/5  Wrist extension 4+/5 TBD 3/5 3/5 3+/5  Wrist ulnar deviation         Wrist radial deviation         Wrist pronation         Wrist supination         (Blank rows = not tested)   HAND FUNCTION: Grip strength: Right: 33 lbs; Left: 11 lbs, Lateral pinch: Right: 17 lbs, Left: 2 lbs, and 3 point pinch: Right: 17 lbs, Left: 2 lbs  10/09/2022: Grip strength: Right: 33 lbs; Left: 10 lbs, Lateral pinch: Right: 17 lbs, Left: 1 lbs, and 3 point pinch: Right: 17 lbs, Left: 1 lbs   10/23/2022: Grip strength: Right: 33 lbs; Left: 16 lbs, Lateral pinch: Right: 17 lbs, Left: 1 lbs, and 3 point pinch: Right: 17 lbs, Left: 1 lbs  12/02/2022: Grip strength: Right:  33 lbs; Left: 17 lbs, Lateral pinch: Right: 17 lbs, Left: 7 lbs, and 3 point pinch: Right: 17 lbs, Left: 3 lbs     COORDINATION:   9 Hole Peg test: Right: 27 sec.  sec; Left: 52 sec.  10/09/2022: 9 Hole Peg test: Right: 27 sec.  sec; Left: 1 min. & 35 sec.  SENSATION: Light touch: WFL Proprioception: WFL   COGNITION: Overall cognitive status: Within functional limits for tasks assessed   VISION: Subjective report: Glasses. Just received a new prescription to increase bifocal strength Baseline vision: Macular Degeneration Visual history: macular degeneration   VISION ASSESSMENT: To be further assessed in functional context  Left sided Awareness      PERCEPTION: Limited left sided awareness     TODAY'S TREATMENT:   Measurements were obtained, and goals were reviewed with the Pt.   Therapeutic Exercise:   Pt. tolerated PROM in all joint ranges of the left shoulder. Pt. worked on [employer], and reciprocal motion using the UBE while seated for 8 min. with no resistance initially, and the resistance gradually increasing. Constant monitoring was provided.    Manual therapy:  Pt. tolerated soft tissue massage to the left scapular musculature, and the left upper arm. Manual therapy was performed independent of, and in preparation for therapeutic Ex.     Neuromuscular re-education:  Pt. worked on bilateral [facility] skills needed to grasp small resistive beads. Pt. worked on connecting the beads using a 3pt. pinch, and pt. Pinch grasp. Pt. worked on disconnecting the resistive beads using a lateral pinch grasp, and 3pt. pinch grasp.    PATIENT EDUCATION: Education details: Compensatory strategies  during typing. Person educated: Patient Education method: verbal cues Education comprehension: verbalized understanding and pt does not plan to cook unless son is present.     HOME EXERCISE PROGRAM:  Reviewed home activities to enhance left hand digit extension  during typing  skills.   GOALS: Goals reviewed with patient? Yes     SHORT TERM GOALS: Target date: 01/13/2023       Pt. Will improve FOTO score by 2 points to reflect improved pt. perceived functional performance Baseline: 12/01/2022: FOTO: 51 10/23/2022: FOTO 49 10/09/2022: FOTO 49, FOTO: 61 TR score: 62  Goal status: INITIAL   LONG TERM GOALS: Target date:02/24/2023  Pt. Will improve left shoulder ROM by 10 degrees to be able able to efficiently apply deodorant Baseline: 12/02/2022: Shoulder flexion: 32(82), Abduction: 45(60)  10/23/2022: Shoulder flexion: 26(80), Abduction: 32(50) 10/09/2022: flexion: 0(55), Abduction: 32(54) Goal status: Improving, but very limited.   2.  Pt. will be able to independently hold and use a blow dryer, and brush for hair care. Baseline: 12/02/2022: Pt. is unable to actively elevate he left shoulder in order to blow dry her hair. 10/23/2022: Pt. continues to be unable to hold a blow dryer. Eval: Pt. Is unable to sustain her BUEs in elevation, and use a blow dryer, and brush. 10/09/2022: Pt. Is unable to hold a blow dryer. Eval: Pt. Is unable to sustain her BUEs in elevation, and use a blow dryer, and brush. Goal status: Ongoing   3.  Pt. Will improve left lateral pinch strength by 3# to be able to independently cut meat. Baseline: 12/02/2022: Left pinch: 7# Pt. has difficulty cutting meat. 10/23/2022: Pt. Continues to present with difficulty cutting meat. 10/09/2022: 1#  Eval: 2#. Pt. has difficulty stabilizing utensil, and food while cutting food. Goal status: Ongoing   4.  Pt. Will improve Left hand [facility] skills to be able to be able to independently, and efficiently manipulate small objects during ADL tasks. Baseline 12/02/2022: TBD  10/23/2022: Pt. Continues to present with difficulty manipulating small objects. 10/09/2022: Left: 1 min & 35 sec.: Eval: Left: 52 sec. Right: 27 sec. Goal status: Ongoing     6.: Pt. will improve with left grip strength to be able to hold and pull up  covers. Baseline: 12/02/2022: 17# 10/23/2022: Left: 16# 10/09/2022: Left: 10# Eval:  Left grip strength 11#. Pt. has difficulty holding a full drink  securely with the left hand.             Goal status: Ongoing   7. Pt. Will improve typing speed, and accuracy in preparation for efficiently typing simple email message. Baseline: 12/02/2022:  Pt. continues to present with limited speed, and accuracy with typing.10/23/2022: Pt. continues to present with limited speed, and accuracy with typing. 10/09/2022: Pt. presents with difficulty typing. Pt. presents with difficulty typing an email. Typing speed, and accuracy TBD             Goal status: INITIAL                                        CLINICAL IMPRESSION:  Measurements were obtained, and goals were reviewed. Pt. has made progress with left shoulder ROM, however continues to have very limited active movement.   Pt. has improved with left grip strength, and pinch strength. Pt. has difficulty raising her left arm to apply deodorant, and perform hair care tasks. Pt.'s FOTO  score: 51. Pt. is improving with left hand FMC, and has difficulty at times manipulating small items. Pt. requires fewer cues to avoid hiking the left shoulder, as Pt. compensates with hiking in the left shoulder during [facility] tasks. Pt. continues to benefit from OT services to work towards improving Left UE functioning in order to maximize engagement in, and overall independence with ADLs, and IADL tasks.     PERFORMANCE DEFICITS in functional skills including ADLs, IADLs, coordination, dexterity, sensation, ROM, strength, FMC, decreased knowledge of use of DME, vision, and UE functional use, cognitive skills including attention, and psychosocial skills including coping strategies, environmental adaptation, habits, and routines and behaviors.    IMPAIRMENTS are limiting patient from ADLs, IADLs, and social participation.    COMORBIDITIES may have co-morbidities  that affects occupational  performance. Patient will benefit from skilled OT to address above impairments and improve overall function.   MODIFICATION OR ASSISTANCE TO COMPLETE EVALUATION: Min-Moderate modification of tasks or assist with assess necessary to complete an evaluation.   OT OCCUPATIONAL PROFILE AND HISTORY: Detailed assessment: Review of records and additional review of physical, cognitive, psychosocial history related to current functional performance.   CLINICAL DECISION MAKING: Moderate - several treatment options, min-mod task modification necessary   REHAB POTENTIAL: Good   EVALUATION COMPLEXITY: Moderate      PLAN: OT FREQUENCY: 2x/week   OT DURATION: 12 weeks   PLANNED INTERVENTIONS: self care/ADL training, therapeutic exercise, therapeutic activity, neuromuscular re-education, manual therapy, passive range of motion, and paraffin   RECOMMENDED OTHER SERVICES: ST, and PT   CONSULTED AND AGREED WITH PLAN OF CARE: Patient   PLAN FOR NEXT SESSION:  L hand strengthening and coordination exercises    Olegario Messier, MS, OTR/L  12/02/22, 3:39 PM

## 2022-12-02 NOTE — Therapy (Unsigned)
OUTPATIENT PHYSICAL THERAPY  TREATMENT   Patient Name: Alyssa Mayo MRN: [id number] DOB:[birth year], 77 y.o., female Today's Date: 12/02/2022  END OF SESSION:   PT End of Session - 12/02/22 1102     Visit Number 20    Number of Visits 36    Date for PT Re-Evaluation 01/22/23    Authorization Type [employer]    Progress Note Due on Visit 30    PT Start Time 1102    PT Stop Time 1145    PT Time Calculation (min) 43 min    Equipment Utilized During Treatment Gait belt    Activity Tolerance Patient tolerated treatment well;No increased pain    Behavior During Therapy [facility] [place] for tasks assessed/performed                   Past Medical History:  Diagnosis Date   Abnormal levels of other serum enzymes 07/17/2015   Arthritis    Constipation 07/11/2015   COVID-19 03/06/2021   Diabetes mellitus without complication (HCC)    Elevated liver enzymes    Fatty liver    Generalized abdominal pain 07/11/2015   Hyperlipidemia    Hypertension    Lifelong obesity    Macular degeneration    Morbid obesity due to excess calories (HCC) 07/11/2015   Other fatigue 07/11/2015   Sleep apnea    Spinal headache    Past Surgical History:  Procedure Laterality Date   ABDOMINAL HYSTERECTOMY     APPENDECTOMY     CATARACT EXTRACTION     CERVICAL LAMINECTOMY  07/21/1985   CESAREAN SECTION     COLONOSCOPY  07/21/2012   diverticulosis, [facility] Dr. [name]    COLONOSCOPY WITH PROPOFOL N/A 02/04/2019   Procedure: COLONOSCOPY WITH PROPOFOL;  Surgeon: [name], MD;  Location: [facility] ENDOSCOPY;  Service: Endoscopy;  Laterality: N/A;   COLONOSCOPY WITH PROPOFOL N/A 03/18/2021   Procedure: COLONOSCOPY WITH PROPOFOL;  Surgeon: [name], MD;  Location: [facility] ENDOSCOPY;  Service: Endoscopy;  Laterality: N/A;  DM   DIAGNOSTIC LAPAROSCOPY     ESOPHAGOGASTRODUODENOSCOPY (EGD) WITH PROPOFOL N/A 02/04/2019   Procedure: ESOPHAGOGASTRODUODENOSCOPY (EGD) WITH PROPOFOL;  Surgeon:  [name], MD;  Location: [facility] ENDOSCOPY;  Service: Endoscopy;  Laterality: N/A;   ESOPHAGOGASTRODUODENOSCOPY (EGD) WITH PROPOFOL N/A 03/18/2021   Procedure: ESOPHAGOGASTRODUODENOSCOPY (EGD) WITH PROPOFOL;  Surgeon: [name], MD;  Location: [facility] ENDOSCOPY;  Service: Endoscopy;  Laterality: N/A;   LOOP RECORDER INSERTION N/A 08/05/2021   Procedure: LOOP RECORDER INSERTION;  Surgeon: [name], MD;  Location: [facility] INVASIVE CV LAB;  Service: Cardiovascular;  Laterality: N/A;   SPINE SURGERY     TUBAL LIGATION     Patient Active Problem List   Diagnosis Date Noted   UTI (urinary tract infection) 09/01/2022   COVID-19 virus infection 09/01/2022   Chronic diastolic CHF (congestive heart failure) (HCC) 09/01/2022   Chronic kidney disease, stage 3a (HCC) 09/01/2022   Obesity (BMI 30-39.9) 09/01/2022   Paroxysmal atrial fibrillation (HCC) 08/08/2022   Hypercoagulable state due to paroxysmal atrial fibrillation (HCC) 08/08/2022   Cerebral edema (HCC) 08/07/2021   OSA (obstructive sleep apnea) 08/07/2021   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   CVA (cerebral vascular accident) (HCC) 08/01/2021   GERD (gastroesophageal reflux disease) 07/20/2019   Advanced care planning/counseling discussion 12/17/2016   Skin lesions, generalized 11/13/2016   Chronic right hip pain 06/17/2016   Vitamin D deficiency 09/26/2015   Diastasis recti 08/08/2015   Elevated serum GGT  level 07/24/2015   Essential hypertension 07/17/2015   Fatty liver 07/17/2015   Elevated alkaline phosphatase level 07/17/2015   Elevated serum glutamic pyruvic transaminase (SGPT) level 07/17/2015   Abnormal finding on EKG 07/11/2015   Morbid obesity (HCC) 07/11/2015   Colon, diverticulosis 07/11/2015   Abdominal wall hernia 07/11/2015   DM (diabetes mellitus), type 2 with neurological complications (HCC)    Hyperlipidemia     PCP: [name], MD  REFERRING PROVIDER: [name], MD &  Edilia Bo, [place]  REFERRING DIAG: [phone] (ICD-10-CM) - Cerebral infarction due to unspecified occlusion or stenosis of unspecified precerebral arteries, Cervicalgia with degenerative disc  THERAPY DIAG:  Muscle weakness (generalized)  Other lack of coordination  Unsteadiness on feet  Difficulty in walking, not elsewhere classified  Pain in left hip  Cervical pain  Painful cervical ROM  Rationale for Evaluation and Treatment: Rehabilitation  ONSET DATE: 09/03/22  SUBJECTIVE:                                                                                                                                                                                                         SUBJECTIVE STATEMENT:  Pt reports no trips or LOB since prior PT treatment. Pt reports mild L hip pain. 0.5/10 on this days. States that pain in the L hip was worse last night, but improved after a good night sleep.    PERTINENT HISTORY:   Per chart and confirmed by pt: Pt is a 77 yo female with RMCA CVA infarction with hemorrhagic transformation 08/01/2021, with inpatient rehab 08/07/2021-08/22/2021 followed by [facility] therapy until June. Pt now currently being seen for outpatient OT and ST services. Pt reports limitations in stamina persist. Other PMH is significant for HTN, HLD, macular degeneration, DM, obesity, sleep apnea, arthritis, chronic R hip pain, vitamin D deficiency, diastasis recti, abdominal wall hernia, hx of abdominal hysterectomy, appendectomy, spine surgery (years ago).   PAIN:  Are you having pain? Yes: NPRS scale: 2/10 Pain location:  L hip/L knee  Pain description: dull ache Aggravating factors: sitting Relieving factors: laying down  PRECAUTIONS: None  WEIGHT BEARING RESTRICTIONS: No  FALLS:  Has patient fallen in last 6 months? Yes. Number of falls 2  LIVING ENVIRONMENT: Lives with: lives with their son Lives in: House/apartment Stairs: Yes: Internal: 13 steps; doesn't go up to  the second floor and External: 4 steps; can reach both Has following equipment at home: Single point cane and Walker - 2 wheeled  OCCUPATION: Retired  PLOF: Independent with basic ADLs  PATIENT GOALS: "Get stronger.  And reduce fall risk. Reduce reliance on cane"  NEXT MD VISIT: end of May.   OBJECTIVE:   DIAGNOSTIC FINDINGS:    CT HEAD WITHOUT CONTRAST  CT CERVICAL SPINE WITHOUT CONTRAST IMPRESSION: 1. No acute intracranial pathology. 2. Expected interval evolution of encephalomalacia of the right frontal lobe, following intraparenchymal hemorrhage seen acutely on examination dated 08/03/2021. 3. No fracture or static subluxation of the cervical spine. 4. Generally mild disc space height loss and osteophytosis throughout the cervical spine, with partial ankylosis of C5-C6 and a large left eccentric posterior bridging osteophyte at this level. MRI may be used to further evaluate cervical disc and neural foraminal pathology if indicated by neurologically localizing signs and symptoms.  EXAM: xRay PELVIS - 1-2 VIEW  IMPRESSION: 1. No convincing evidence of acute fracture or diastasis of the pelvis. 2. Well corticated osseous focus beneath the right inferior pubic ramus may reflect sequela of avulsion injury or chronic tendinopathy, suggest correlation with direct tenderness to palpation.   PATIENT SURVEYS:  NDI 15/50 = 30% disability FOTO 65/68   10/23/2022 : 52/68  10/23/2022 Foto CVA 57/59  COGNITION: Overall cognitive status: History of cognitive impairments - at baseline  SENSATION: WFL  POSTURE: rounded shoulders and forward head  PALPATION:     CERVICAL ROM:   Active ROM A/PROM (deg) eval 3/21 4/4  Flexion 39 25 39  Extension 10 24 25   Right lateral flexion 7 15 15   Left lateral flexion 6 20 14   Right rotation 49 53 41  Left rotation 30 42 35   (Blank rows = not tested)  UPPER EXTREMITY ROM:  Active ROM Right eval Left eval Right 4/4  Left 4/4  Shoulder flexion 128 81 149 22  Shoulder abduction 160 68  158  38   (Blank rows = not tested)   UPPER EXTREMITY MMT:  Grossly decreased (3+/5) in the L UE due to pain and lack of ROM   CERVICAL SPECIAL TESTS:  Spurling's test: Negative and Sharp pursor's test: Negative      LOWER EXTREMITY MMT:    MMT Right 3/21 Left 3/21  Hip flexion 4+ 4-  Hip extension 4+ 4-  Hip abduction 4+ 4  Hip adduction 4+ 4  Hip internal rotation    Hip external rotation    Knee flexion 4+ 4-  Knee extension 4+ 4  Ankle dorsiflexion 4+ 4  Ankle plantarflexion    Ankle inversion    Ankle eversion     (Blank rows = not tested)  LOWER EXTREMITY ROM:     Passive  Right 3/21 Left 3/21  Hip flexion 120 105  Hip extension    Hip abduction    Hip adduction    Hip internal rotation    Hip external rotation    Knee flexion [facility] WFL  Knee extension (in Hip flexion 90deg) 143 120  Ankle dorsiflexion    Ankle plantarflexion    Ankle inversion    Ankle eversion     (Blank rows = not tested)    LOWER EXTREMITY SPECIAL TESTS:  Hip special tests: Luisa Hart (FABER) test: positive , Hip scouring test: negative, and distraction: negative    FUNCTIONAL TESTS:  Not performed at this time.  TODAY'S TREATMENT: DATE: 12/02/22  CVA Foto 56.  Neck Foto 56.   10 Meter Walk Test: Patient instructed to walk 10 meters (32.8 ft) as quickly and as safely as possible at their normal speed x2 and at a fast speed x2. Time measured from  2 meter mark to 8 meter mark to accommodate ramp-up and ramp-down.  Normal speed 1: 12.08sec  Normal speed 2: 11.19sec  Average Normal speed: 0.85 m/s (11.635sec)   Cut off scores: <0.4 m/s = household Ambulator, 0.4-0.8 m/s = limited community Ambulator, >0.8 m/s = community Ambulator, >1.2 m/s = crossing a street, <1.0 = increased fall risk MCID 0.05 m/s (small), 0.13 m/s (moderate), 0.06 m/s (significant)  (ANPTA Core Set of Outcome Measures for Adults  with Neurologic Conditions, 2018)  6 Min Walk Test:  Instructed patient to ambulate as quickly and as safely as possible for 6 minutes using LRAD. Patient was allowed to take standing rest breaks without stopping the test, but if the patient required a sitting rest break the clock would be stopped and the test would be over.  Results: 918 feet using SPC intermittently with supervision assist for safety. 1 short standing rest break due to mild LPB. Results indicate that the patient has reduced endurance with ambulation compared to age matched norms.  Age Matched Norms: 54-69 yo M: 48 F: 37, 25-79 yo M: 2 F: 471, 71-89 yo M: 417 F: 392 MDC: 58.21 meters (190.98 feet) or 50 meters (ANPTA Core Set of Outcome Measures for Adults with Neurologic Conditions, 2018)  Standing:   Hip flexor stretch 30 sec x 2  Hip adductor stretch 30sec x 2  Seated glute/piriformis stretch 2 x 30 sec  Pt performed sit<>stand 2x5     PATIENT EDUCATION:  Education details: Pt educated throughout session about proper posture and technique with exercises. Improved exercise technique, movement at target joints, use of target muscles after min to mod verbal, visual, tactile cues.  Person educated: Patient and   Education method: Explanation Education comprehension: verbalized understanding  HOME EXERCISE PROGRAM:  Access Code: [email or web address] URL: [email or web address] Date: 09/18/2022 Prepared by: Tomasa Hose  Exercises - Seated Upper Trapezius Stretch  - 1 x daily - 7 x weekly - 3 sets - 10 reps - Seated Levator Scapulae Stretch  - 1 x daily - 7 x weekly - 3 sets - 10 reps - Seated Scapular Retraction  - 1 x daily - 7 x weekly - 3 sets - 10 reps  ASSESSMENT:  CLINICAL IMPRESSION: Patient presents with good motivation for completion of physical therapy activities. Pt completed assessment towards LTG. Noted improvement cervical ROM gait speed and distance on 6 min walk test. Pt reports mild L hip  pain present at start of session, but improved following therex.  Pt will continue to benefit from skilled physical therapy intervention to address impairments, improve QOL, and attain therapy goals.       OBJECTIVE IMPAIRMENTS: decreased activity tolerance, decreased cognition, decreased ROM, decreased strength, decreased safety awareness, hypomobility, and pain.   ACTIVITY LIMITATIONS: carrying, lifting, bending, sitting, standing, squatting, and reach over head  PARTICIPATION LIMITATIONS: meal prep, cleaning, laundry, driving, shopping, community activity, and yard work  PERSONAL FACTORS: Age, Behavior pattern, Past/current experiences, Time since onset of injury/illness/exacerbation, and 3+ comorbidities: arthritis, DM2, HTN, Obesity,   are also affecting patient's functional outcome.   REHAB POTENTIAL: Good  CLINICAL DECISION MAKING: Stable/uncomplicated  EVALUATION COMPLEXITY: Low   GOALS: Goals reviewed with patient? Yes  SHORT TERM GOALS: Target date: 12/11/2022    Pt will be independent with HEP in order to improve strength of BLE and ROM of the neck in order to return to PLOF. Baseline: provided on neck visit Goal status: INITIAL    LONG TERM GOALS: Target date: 01/22/2023  Patient will demonstrate improved function as evidenced by a score of 68 on FOTO measure for full participation in activities at home and in the community. Baseline: 65. 10/23/22: 52. 5/15 56 for neck and CVA Goal status: IN PROGRESS  2.  Pt to improve NDI by 5 points or 10% in order to demonstrate a reduction in overall disability caused by neck pain at this time. Baseline: 15/50 or 30% disability 4/11 27/50  Goal status: NOT MET  3.  Pt to demonstrate improved cervical rotation to 60 deg bilaterally in order to perform tasks around the house that require turning of the head. Baseline: R/L Cervical Rotation: 49/30. 4/424 R:53. L:42  5/14: R45deg. L50deg  Goal status: IN PROGRESS  4.  Pt  to report 0/10 pain in the neck following light household chores at home in order to demonstrate an overall reduction in pain and an improvement in QoL. Baseline: 4/10 at rest 0/10 in neck Goal status: MET  4.  Pt will improve gait speed to <1.61m/s to indicate improved community mobility and reduced fall risk  Baseline: 0.28m/s  Goal status: revised   5. Pt will increase 6 min walk test >1058ft  Baseline: 954ft with SPC Goal status: in progress.   PLAN:  PT FREQUENCY: 1-2x/week  PT DURATION: 12 weeks  PLANNED INTERVENTIONS: Therapeutic exercises, Therapeutic activity, Neuromuscular re-education, Balance training, Gait training, Patient/Family education, Self Care, Joint mobilization, Joint manipulation, Stair training, Vestibular training, Canalith repositioning, Visual/preceptual remediation/compensation, DME instructions, Dry Needling, Cognitive remediation, Electrical stimulation, Spinal manipulation, Spinal mobilization, Cryotherapy, Moist heat, Traction, Ultrasound, Manual therapy, and Re-evaluation    PLAN FOR NEXT SESSION:    Increased strengthening and dynamic balance training.   Manual therapy as needed for pain management on LLE.    Golden Pop PT ,DPT Physical Therapist- [facility]   11:50 AM 12/02/22

## 2022-12-04 ENCOUNTER — Ambulatory Visit: Payer: Medicare HMO | Admitting: Occupational Therapy

## 2022-12-04 ENCOUNTER — Ambulatory Visit: Payer: Medicare HMO | Admitting: Physical Therapy

## 2022-12-04 DIAGNOSIS — M6281 Muscle weakness (generalized): Secondary | ICD-10-CM | POA: Diagnosis not present

## 2022-12-04 DIAGNOSIS — M542 Cervicalgia: Secondary | ICD-10-CM

## 2022-12-04 DIAGNOSIS — R262 Difficulty in walking, not elsewhere classified: Secondary | ICD-10-CM

## 2022-12-04 DIAGNOSIS — R2681 Unsteadiness on feet: Secondary | ICD-10-CM

## 2022-12-04 DIAGNOSIS — R278 Other lack of coordination: Secondary | ICD-10-CM

## 2022-12-04 DIAGNOSIS — R482 Apraxia: Secondary | ICD-10-CM

## 2022-12-04 DIAGNOSIS — M25552 Pain in left hip: Secondary | ICD-10-CM

## 2022-12-04 NOTE — Therapy (Signed)
Occupational Therapy Neuro Treatment Note   Patient Name: [name] MRN: [id number] DOB:04-03-1946, 77 y.o., female Today's Date: 03/14/2022   PCP: [name], MD REFERRING PROVIDER: [name], NP     OT End of Session - 12/04/22 1210     Visit Number 21    Number of Visits 24    Date for OT Re-Evaluation 02/24/23    Authorization Type Progress reporting period starting 09/16/2022    OT Start Time 1145    OT Stop Time 1230    OT Time Calculation (min) 45 min    Activity Tolerance Patient tolerated treatment well    Behavior During Therapy [facility] for tasks assessed/performed                                    Past Medical History:  Diagnosis Date   Abnormal levels of other serum enzymes 07/17/2015   Arthritis     Constipation 07/11/2015   COVID-19 03/06/2021   Diabetes mellitus without complication (HCC)     Elevated liver enzymes     Fatty liver     Generalized abdominal pain 07/11/2015   Hyperlipidemia     Hypertension     Lifelong obesity     Macular degeneration     Morbid obesity due to excess calories (HCC) 07/11/2015   Other fatigue 07/11/2015   Sleep apnea     Spinal headache           Past Surgical History:  Procedure Laterality Date   ABDOMINAL HYSTERECTOMY       APPENDECTOMY       CATARACT EXTRACTION       CERVICAL LAMINECTOMY   07/21/1985   CESAREAN SECTION       COLONOSCOPY   07/21/2012    diverticulosis, [facility] Dr. [name]    COLONOSCOPY WITH PROPOFOL N/A 02/04/2019    Procedure: COLONOSCOPY WITH PROPOFOL;  Surgeon: [name], MD;  Location: [facility] ENDOSCOPY;  Service: Endoscopy;  Laterality: N/A;   COLONOSCOPY WITH PROPOFOL N/A 03/18/2021    Procedure: COLONOSCOPY WITH PROPOFOL;  Surgeon: [name], MD;  Location: [facility] ENDOSCOPY;  Service: Endoscopy;  Laterality: N/A;  DM   DIAGNOSTIC LAPAROSCOPY       ESOPHAGOGASTRODUODENOSCOPY (EGD) WITH PROPOFOL N/A 02/04/2019    Procedure: ESOPHAGOGASTRODUODENOSCOPY (EGD)  WITH PROPOFOL;  Surgeon: [name], MD;  Location: [facility] ENDOSCOPY;  Service: Endoscopy;  Laterality: N/A;   ESOPHAGOGASTRODUODENOSCOPY (EGD) WITH PROPOFOL N/A 03/18/2021    Procedure: ESOPHAGOGASTRODUODENOSCOPY (EGD) WITH PROPOFOL;  Surgeon: [name], MD;  Location: [facility] ENDOSCOPY;  Service: Endoscopy;  Laterality: N/A;   LOOP RECORDER INSERTION N/A 08/05/2021    Procedure: LOOP RECORDER INSERTION;  Surgeon: [name], MD;  Location: [facility] INVASIVE CV LAB;  Service: Cardiovascular;  Laterality: N/A;   SPINE SURGERY       TUBAL LIGATION            Patient Active Problem List    Diagnosis Date Noted   Paroxysmal atrial fibrillation (HCC) 08/08/2022   Hypercoagulable state due to paroxysmal atrial fibrillation (HCC) 08/08/2022   Cerebral edema (HCC) 08/07/2021   OSA (obstructive sleep apnea) 08/07/2021   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   CVA (cerebral vascular accident) (HCC) 08/01/2021   GERD (gastroesophageal reflux disease) 07/20/2019   Advanced care planning/counseling discussion 12/17/2016   Skin lesions, generalized 11/13/2016   Chronic right hip pain 06/17/2016  Vitamin D deficiency 09/26/2015   Diastasis recti 08/08/2015   Elevated serum GGT level 07/24/2015   Essential hypertension 07/17/2015   Fatty liver 07/17/2015   Elevated alkaline phosphatase level 07/17/2015   Elevated serum glutamic pyruvic transaminase (SGPT) level 07/17/2015   Abnormal finding on EKG 07/11/2015   Morbid obesity (HCC) 07/11/2015   Colon, diverticulosis 07/11/2015   Abdominal wall hernia 07/11/2015   DM (diabetes mellitus), type 2 with neurological complications (HCC)     Hyperlipidemia        ONSET DATE: 08/01/2021   REFERRING DIAG: CVA   THERAPY DIAG:  Muscle weakness (generalized)   Rationale for Evaluation and Treatment Rehabilitation   SUBJECTIVE:    SUBJECTIVE STATEMENT:    Pt. reports planning to visit her son in [place] over Memorial Day  weekend, and plans to take a train back.  Pt accompanied by: self   PERTINENT HISTORY:  Pt. is a 77 y.o. female who had a unexpectedly hospitalized from 2/12-2/14/2024 for COVID-19, and a UTI.  Pt. 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 Pt. attended inpatient rehab from 08/07/2021-08/22/2021. Pt. received Home health therapy services this past spring.  Pt. has recently had an assessment through driver rehabilitation services at [facility] with recommendations for referrals for  outpatient OT/PT, and ST services. Pt. PMHx includes: HTN, Hyperlipidemia, Macular degeneration, DM, and Obesity.   PRECAUTIONS: None   WEIGHT BEARING RESTRICTIONS No   PAIN:  Are you having pain?  1/10 pain   FALLS: Has patient fallen in last 6 months? Yes.   LIVING ENVIRONMENT: Lives with: lives with their family  Son  [name] Lives in: House/apartment Main living are on one floor Stairs:  2 steps to enter Has following equipment at home: Single point cane, Wheelchair (manual), shower chair, Grab bars, bed rail, and rubber mat.   PLOF: Independent   PATIENT GOALS: To be able to Drive, and cook again   OBJECTIVE:    HAND DOMINANCE: Right    ADLs:  Transfers/ambulation related to ADLs: Independent Eating: Independent Grooming: [employer] Pt. reports being unable to use her left hand to perform haircare.  UB Dressing: Independent pullover shirt, independent now with fastening a bra LB Dressing: Independent donning pants socks, and slide on shoes Toileting: Independent Bathing: Independent Tub Shower transfers: Walk-in shower Independent now     IADLs: Shopping: Needs to be accompanied to the grocery store. Light housekeeping: Do  Meal Prep:  Is able to perform light meal prep Community mobility: Relies on son, and friend Medication management: Son sets up Mining engineer management:TBD Handwriting: TBD   MOBILITY STATUS: Hx of falls out of bed      ACTIVITY TOLERANCE: Activity tolerance:  10-20 min. Before rest break   FUNCTIONAL OUTCOME MEASURES: FOTO: 61  TR score: 62  10/09/2022: FOTO: 49   UPPER EXTREMITY ROM      Active ROM Right eval Left eval Left  10/09/2022 Left 10/23/22 Left 12/02/2022  Shoulder flexion WFL 54(74) scaption 0(55) 26(80) 32(82)  Shoulder abduction WFL 58(75) 32(54) 32(50) 45(60)  Shoulder adduction         Shoulder extension         Shoulder internal rotation         Shoulder external rotation         Elbow flexion [facility] WFL 145 145 145  Elbow extension [facility] WFL 10(0) 10(0) WFL  Wrist flexion WFL TBD [facility] WFL  Wrist extension WFL TBD [facility]  Wrist ulnar deviation         Wrist radial deviation         Wrist pronation         Wrist supination         (Blank rows = not tested)     UPPER EXTREMITY MMT:      MMT Right eval Left eval Left 10/09/2022 Left 10/23/2022 Left 12/02/2022  Shoulder flexion 4+/5 2+/5 1/5 2-/5 2-/5  Shoulder abduction 4+/5 2+/5 2-/5 2-/5 2-/5  Shoulder adduction         Shoulder extension         Shoulder internal rotation         Shoulder external rotation         Middle trapezius         Lower trapezius         Elbow flexion 4+/5 3+/5 3/5 3/5 3+/5  Elbow extension 4+/5 3+/5 3/5 3/5 3+/5  Wrist flexion 4+/5 TBD 3/5 3/5 3+/5  Wrist extension 4+/5 TBD 3/5 3/5 3+/5  Wrist ulnar deviation         Wrist radial deviation         Wrist pronation         Wrist supination         (Blank rows = not tested)   HAND FUNCTION: Grip strength: Right: 33 lbs; Left: 11 lbs, Lateral pinch: Right: 17 lbs, Left: 2 lbs, and 3 point pinch: Right: 17 lbs, Left: 2 lbs  10/09/2022: Grip strength: Right: 33 lbs; Left: 10 lbs, Lateral pinch: Right: 17 lbs, Left: 1 lbs, and 3 point pinch: Right: 17 lbs, Left: 1 lbs   10/23/2022: Grip strength: Right: 33 lbs; Left: 16 lbs, Lateral pinch: Right: 17 lbs, Left: 1 lbs, and 3 point pinch: Right: 17 lbs, Left: 1 lbs  12/02/2022: Grip  strength: Right: 33 lbs; Left: 17 lbs, Lateral pinch: Right: 17 lbs, Left: 7 lbs, and 3 point pinch: Right: 17 lbs, Left: 3 lbs     COORDINATION:   9 Hole Peg test: Right: 27 sec.  sec; Left: 52 sec.  10/09/2022: 9 Hole Peg test: Right: 27 sec.  sec; Left: 1 min. & 35 sec.  SENSATION: Light touch: WFL Proprioception: WFL   COGNITION: Overall cognitive status: Within functional limits for tasks assessed   VISION: Subjective report: Glasses. Just received a new prescription to increase bifocal strength Baseline vision: Macular Degeneration Visual history: macular degeneration   VISION ASSESSMENT: To be further assessed in functional context  Left sided Awareness      PERCEPTION: Limited left sided awareness     TODAY'S TREATMENT:     Therapeutic Exercise:               Pt. tolerated PROM in all joint ranges of the left shoulder. Pt. worked on [employer], and reciprocal motion using the UBE while seated for 8 min. with no resistance initially, and the resistance gradually increasing. Constant monitoring was provided.                Manual therapy:   Pt. tolerated soft tissue massage to the left scapular musculature, and the left upper arm. Manual therapy was performed independent of, and in preparation for therapeutic Ex.                Neuromuscular re-education:   Pt. worked on [facility] skills grasping 1" sticks. Pt. worked on storing the objects in the palm, and translatory skills moving the  items from the palm of the hand to the tip of the 2nd digit, and thumb. Pt. worked on removing the pegs using bilateral alternating hand patterns.       PATIENT EDUCATION: Education details: Compensatory strategies during typing. Person educated: Patient Education method: verbal cues Education comprehension: verbalized understanding and pt does not plan to cook unless son is present.     HOME EXERCISE PROGRAM:  Reviewed home activities to enhance left hand digit extension   during typing skills.   GOALS: Goals reviewed with patient? Yes     SHORT TERM GOALS: Target date: 01/13/2023       Pt. Will improve FOTO score by 2 points to reflect improved pt. perceived functional performance Baseline: 12/01/2022: FOTO: 51 10/23/2022: FOTO 49 10/09/2022: FOTO 49, FOTO: 61 TR score: 62  Goal status: INITIAL   LONG TERM GOALS: Target date:02/24/2023  Pt. Will improve left shoulder ROM by 10 degrees to be able able to efficiently apply deodorant Baseline: 12/02/2022: Shoulder flexion: 32(82), Abduction: 45(60)  10/23/2022: Shoulder flexion: 26(80), Abduction: 32(50) 10/09/2022: flexion: 0(55), Abduction: 32(54) Goal status: Improving, but very limited.   2.  Pt. will be able to independently hold and use a blow dryer, and brush for hair care. Baseline: 12/02/2022: Pt. is unable to actively elevate he left shoulder in order to blow dry her hair. 10/23/2022: Pt. continues to be unable to hold a blow dryer. Eval: Pt. Is unable to sustain her BUEs in elevation, and use a blow dryer, and brush. 10/09/2022: Pt. Is unable to hold a blow dryer. Eval: Pt. Is unable to sustain her BUEs in elevation, and use a blow dryer, and brush. Goal status: Ongoing   3.  Pt. Will improve left lateral pinch strength by 3# to be able to independently cut meat. Baseline: 12/02/2022: Left pinch: 7# Pt. has difficulty cutting meat. 10/23/2022: Pt. Continues to present with difficulty cutting meat. 10/09/2022: 1#  Eval: 2#. Pt. has difficulty stabilizing utensil, and food while cutting food. Goal status: Ongoing   4.  Pt. Will improve Left hand [facility] skills to be able to be able to independently, and efficiently manipulate small objects during ADL tasks. Baseline 12/02/2022: TBD  10/23/2022: Pt. Continues to present with difficulty manipulating small objects. 10/09/2022: Left: 1 min & 35 sec.: Eval: Left: 52 sec. Right: 27 sec. Goal status: Ongoing     6.: Pt. will improve with left grip strength to be able to  hold and pull up covers. Baseline: 12/02/2022: 17# 10/23/2022: Left: 16# 10/09/2022: Left: 10# Eval:  Left grip strength 11#. Pt. has difficulty holding a full drink  securely with the left hand.             Goal status: Ongoing   7. Pt. Will improve typing speed, and accuracy in preparation for efficiently typing simple email message. Baseline: 12/02/2022:  Pt. continues to present with limited speed, and accuracy with typing.10/23/2022: Pt. continues to present with limited speed, and accuracy with typing. 10/09/2022: Pt. presents with difficulty typing. Pt. presents with difficulty typing an email. Typing speed, and accuracy TBD             Goal status: INITIAL                                        CLINICAL IMPRESSION:  Pt. reports no pain today. Pt. is improving with left hand FMC, and  has difficulty at times manipulating small items.  Pt. Was able to more consistently move the 1" sticks through her palm to the tip of the 2nd digit, and thumb. Pt. required fewer cues to avoid hiking the left shoulder, as Pt. compensates with less hiking in the left shoulder during [facility] tasks. Pt. continues to benefit from OT services to work towards improving Left UE functioning in order to maximize engagement in, and overall independence with ADLs, and IADL tasks.    PERFORMANCE DEFICITS in functional skills including ADLs, IADLs, coordination, dexterity, sensation, ROM, strength, FMC, decreased knowledge of use of DME, vision, and UE functional use, cognitive skills including attention, and psychosocial skills including coping strategies, environmental adaptation, habits, and routines and behaviors.    IMPAIRMENTS are limiting patient from ADLs, IADLs, and social participation.    COMORBIDITIES may have co-morbidities  that affects occupational performance. Patient will benefit from skilled OT to address above impairments and improve overall function.   MODIFICATION OR ASSISTANCE TO COMPLETE EVALUATION:  Min-Moderate modification of tasks or assist with assess necessary to complete an evaluation.   OT OCCUPATIONAL PROFILE AND HISTORY: Detailed assessment: Review of records and additional review of physical, cognitive, psychosocial history related to current functional performance.   CLINICAL DECISION MAKING: Moderate - several treatment options, min-mod task modification necessary   REHAB POTENTIAL: Good   EVALUATION COMPLEXITY: Moderate      PLAN: OT FREQUENCY: 2x/week   OT DURATION: 12 weeks   PLANNED INTERVENTIONS: self care/ADL training, therapeutic exercise, therapeutic activity, neuromuscular re-education, manual therapy, passive range of motion, and paraffin   RECOMMENDED OTHER SERVICES: ST, and PT   CONSULTED AND AGREED WITH PLAN OF CARE: Patient   PLAN FOR NEXT SESSION:  L hand strengthening and coordination exercises    Olegario Messier, MS, OTR/L  12/04/22, 12:14 PM

## 2022-12-04 NOTE — Therapy (Deleted)
OUTPATIENT PHYSICAL THERAPY  TREATMENT   Patient Name: Alyssa Mayo MRN: [id number] DOB:1946/02/13, 77 y.o., female Today's Date: 12/04/2022  END OF SESSION:   PT End of Session - 12/04/22 1109     Visit Number 21    Number of Visits 36    Date for PT Re-Evaluation 01/22/23    Authorization Type [employer]    Progress Note Due on Visit 30    PT Start Time 1106    PT Stop Time 1145    PT Time Calculation (min) 39 min    Equipment Utilized During Treatment Gait belt    Activity Tolerance Patient tolerated treatment well;No increased pain    Behavior During Therapy [facility] for tasks assessed/performed                   Past Medical History:  Diagnosis Date   Abnormal levels of other serum enzymes 07/17/2015   Arthritis    Constipation 07/11/2015   COVID-19 03/06/2021   Diabetes mellitus without complication (HCC)    Elevated liver enzymes    Fatty liver    Generalized abdominal pain 07/11/2015   Hyperlipidemia    Hypertension    Lifelong obesity    Macular degeneration    Morbid obesity due to excess calories (HCC) 07/11/2015   Other fatigue 07/11/2015   Sleep apnea    Spinal headache    Past Surgical History:  Procedure Laterality Date   ABDOMINAL HYSTERECTOMY     APPENDECTOMY     CATARACT EXTRACTION     CERVICAL LAMINECTOMY  07/21/1985   CESAREAN SECTION     COLONOSCOPY  07/21/2012   diverticulosis, [facility] Dr. [name]    COLONOSCOPY WITH PROPOFOL N/A 02/04/2019   Procedure: COLONOSCOPY WITH PROPOFOL;  Surgeon: [name], MD;  Location: [facility] ENDOSCOPY;  Service: Endoscopy;  Laterality: N/A;   COLONOSCOPY WITH PROPOFOL N/A 03/18/2021   Procedure: COLONOSCOPY WITH PROPOFOL;  Surgeon: [name], MD;  Location: [facility] ENDOSCOPY;  Service: Endoscopy;  Laterality: N/A;  DM   DIAGNOSTIC LAPAROSCOPY     ESOPHAGOGASTRODUODENOSCOPY (EGD) WITH PROPOFOL N/A 02/04/2019   Procedure: ESOPHAGOGASTRODUODENOSCOPY (EGD) WITH PROPOFOL;  Surgeon:  [name], MD;  Location: [facility] ENDOSCOPY;  Service: Endoscopy;  Laterality: N/A;   ESOPHAGOGASTRODUODENOSCOPY (EGD) WITH PROPOFOL N/A 03/18/2021   Procedure: ESOPHAGOGASTRODUODENOSCOPY (EGD) WITH PROPOFOL;  Surgeon: [name], MD;  Location: [facility] ENDOSCOPY;  Service: Endoscopy;  Laterality: N/A;   LOOP RECORDER INSERTION N/A 08/05/2021   Procedure: LOOP RECORDER INSERTION;  Surgeon: [name], MD;  Location: [facility] INVASIVE CV LAB;  Service: Cardiovascular;  Laterality: N/A;   SPINE SURGERY     TUBAL LIGATION     Patient Active Problem List   Diagnosis Date Noted   UTI (urinary tract infection) 09/01/2022   COVID-19 virus infection 09/01/2022   Chronic diastolic CHF (congestive heart failure) (HCC) 09/01/2022   Chronic kidney disease, stage 3a (HCC) 09/01/2022   Obesity (BMI 30-39.9) 09/01/2022   Paroxysmal atrial fibrillation (HCC) 08/08/2022   Hypercoagulable state due to paroxysmal atrial fibrillation (HCC) 08/08/2022   Cerebral edema (HCC) 08/07/2021   OSA (obstructive sleep apnea) 08/07/2021   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   CVA (cerebral vascular accident) (HCC) 08/01/2021   GERD (gastroesophageal reflux disease) 07/20/2019   Advanced care planning/counseling discussion 12/17/2016   Skin lesions, generalized 11/13/2016   Chronic right hip pain 06/17/2016   Vitamin D deficiency 09/26/2015   Diastasis recti 08/08/2015   Elevated serum GGT  level 07/24/2015   Essential hypertension 07/17/2015   Fatty liver 07/17/2015   Elevated alkaline phosphatase level 07/17/2015   Elevated serum glutamic pyruvic transaminase (SGPT) level 07/17/2015   Abnormal finding on EKG 07/11/2015   Morbid obesity (HCC) 07/11/2015   Colon, diverticulosis 07/11/2015   Abdominal wall hernia 07/11/2015   DM (diabetes mellitus), type 2 with neurological complications (HCC)    Hyperlipidemia     PCP: [name], MD  REFERRING PROVIDER: [name], MD &  Edilia Bo, [place]  REFERRING DIAG: [id number] (ICD-10-CM) - Cerebral infarction due to unspecified occlusion or stenosis of unspecified precerebral arteries, Cervicalgia with degenerative disc  THERAPY DIAG:  Muscle weakness (generalized)  Other lack of coordination  Difficulty in walking, not elsewhere classified  Cervical pain  Pain in left hip  Painful cervical ROM  Apraxia  Unsteadiness on feet  Rationale for Evaluation and Treatment: Rehabilitation  ONSET DATE: 09/03/22  SUBJECTIVE:                                                                                                                                                                                                         SUBJECTIVE STATEMENT:  Pt reports no trips or LOB since prior PT treatment. No pain on this day. States that she had stiffness and pain in the R hand yesterday, limiting movement, but it resolved overnight.   PERTINENT HISTORY:   Per chart and confirmed by pt: Pt is a 77 yo female with RMCA CVA infarction with hemorrhagic transformation 08/01/2021, with inpatient rehab 08/07/2021-08/22/2021 followed by [facility] therapy until June. Pt now currently being seen for outpatient OT and ST services. Pt reports limitations in stamina persist. Other PMH is significant for HTN, HLD, macular degeneration, DM, obesity, sleep apnea, arthritis, chronic R hip pain, vitamin D deficiency, diastasis recti, abdominal wall hernia, hx of abdominal hysterectomy, appendectomy, spine surgery (years ago).   PAIN:  Are you having pain? Yes: NPRS scale: 2/10 Pain location:  L hip/L knee  Pain description: dull ache Aggravating factors: sitting Relieving factors: laying down  PRECAUTIONS: None  WEIGHT BEARING RESTRICTIONS: No  FALLS:  Has patient fallen in last 6 months? Yes. Number of falls 2  LIVING ENVIRONMENT: Lives with: lives with their son Lives in: House/apartment Stairs: Yes: Internal: 13 steps; doesn't go up to  the second floor and External: 4 steps; can reach both Has following equipment at home: Single point cane and Walker - 2 wheeled  OCCUPATION: Retired  PLOF: Independent with basic ADLs  PATIENT GOALS: "Get stronger. And reduce fall risk.  Reduce reliance on cane"  NEXT MD VISIT: end of May.   OBJECTIVE:   DIAGNOSTIC FINDINGS:    CT HEAD WITHOUT CONTRAST  CT CERVICAL SPINE WITHOUT CONTRAST IMPRESSION: 1. No acute intracranial pathology. 2. Expected interval evolution of encephalomalacia of the right frontal lobe, following intraparenchymal hemorrhage seen acutely on examination dated 08/03/2021. 3. No fracture or static subluxation of the cervical spine. 4. Generally mild disc space height loss and osteophytosis throughout the cervical spine, with partial ankylosis of C5-C6 and a large left eccentric posterior bridging osteophyte at this level. MRI may be used to further evaluate cervical disc and neural foraminal pathology if indicated by neurologically localizing signs and symptoms.  EXAM: xRay PELVIS - 1-2 VIEW  IMPRESSION: 1. No convincing evidence of acute fracture or diastasis of the pelvis. 2. Well corticated osseous focus beneath the right inferior pubic ramus may reflect sequela of avulsion injury or chronic tendinopathy, suggest correlation with direct tenderness to palpation.   PATIENT SURVEYS:  NDI 15/50 = 30% disability FOTO 65/68   10/23/2022 : 52/68  10/23/2022 Foto CVA 57/59  COGNITION: Overall cognitive status: History of cognitive impairments - at baseline  SENSATION: WFL  POSTURE: rounded shoulders and forward head  PALPATION:     CERVICAL ROM:   Active ROM A/PROM (deg) eval 3/21 4/4  Flexion 39 25 39  Extension 10 24 25   Right lateral flexion 7 15 15   Left lateral flexion 6 20 14   Right rotation 49 53 41  Left rotation 30 42 35   (Blank rows = not tested)  UPPER EXTREMITY ROM:  Active ROM Right eval Left eval Right 4/4  Left 4/4  Shoulder flexion 128 81 149 22  Shoulder abduction 160 68  158  38   (Blank rows = not tested)   UPPER EXTREMITY MMT:  Grossly decreased (3+/5) in the L UE due to pain and lack of ROM   CERVICAL SPECIAL TESTS:  Spurling's test: Negative and Sharp pursor's test: Negative      LOWER EXTREMITY MMT:    MMT Right 3/21 Left 3/21  Hip flexion 4+ 4-  Hip extension 4+ 4-  Hip abduction 4+ 4  Hip adduction 4+ 4  Hip internal rotation    Hip external rotation    Knee flexion 4+ 4-  Knee extension 4+ 4  Ankle dorsiflexion 4+ 4  Ankle plantarflexion    Ankle inversion    Ankle eversion     (Blank rows = not tested)  LOWER EXTREMITY ROM:     Passive  Right 3/21 Left 3/21  Hip flexion 120 105  Hip extension    Hip abduction    Hip adduction    Hip internal rotation    Hip external rotation    Knee flexion [facility] WFL  Knee extension (in Hip flexion 90deg) 143 120  Ankle dorsiflexion    Ankle plantarflexion    Ankle inversion    Ankle eversion     (Blank rows = not tested)    LOWER EXTREMITY SPECIAL TESTS:  Hip special tests: Luisa Hart (FABER) test: positive , Hip scouring test: negative, and distraction: negative    FUNCTIONAL TESTS:  6 minute walk test: 953ft with SPC 10 meter walk test: 0.29m/s Not performed at this time.  TODAY'S TREATMENT: DATE: 12/04/22  Gait without AD x 118ft.    Standing therex.  Hip abduction x 12  Hip flexion x 10 bil  Step up/doe 6inch step 2 x 5 BLE   Seated therex:  LAQ 3 # ankle weight. X 12  Reciprocal march 3 # ankle weight. X 12   Hip abduction RTB  x 12 with 2 sec hold Isometric Hip x 12 with 3 sec hold  Ankle PF/DF 3# ankle x 15    Dynamic gait to step over 2 canes and half bolster on floor 2 x 3 with supervision assist. Increased step length noted on the LLE with increased repetitions.   No LOB or pain noted by pt throughout session   PATIENT EDUCATION:  Education details: Pt educated throughout session  about proper posture and technique with exercises. Improved exercise technique, movement at target joints, use of target muscles after min to mod verbal, visual, tactile cues.  Person educated: Patient and   Education method: Explanation Education comprehension: verbalized understanding  HOME EXERCISE PROGRAM:  Access Code: [email or web address] URL: [email or web address] Date: 09/18/2022 Prepared by: Tomasa Hose  Exercises - Seated Upper Trapezius Stretch  - 1 x daily - 7 x weekly - 3 sets - 10 reps - Seated Levator Scapulae Stretch  - 1 x daily - 7 x weekly - 3 sets - 10 reps - Seated Scapular Retraction  - 1 x daily - 7 x weekly - 3 sets - 10 reps  ASSESSMENT:  CLINICAL IMPRESSION: Patient presents with good motivation for completion of physical therapy activities. Pt able to complete all BLE strengthening exercises without increased pain on this day and no LOB. Pt continues to report no cervical pain, but severely limited ROM on the LUE. Pt is being seen by OT for LUE strength.  Pt will continue to benefit from skilled physical therapy intervention to address impairments, improve QOL, and attain therapy goals.       OBJECTIVE IMPAIRMENTS: decreased activity tolerance, decreased cognition, decreased ROM, decreased strength, decreased safety awareness, hypomobility, and pain.   ACTIVITY LIMITATIONS: carrying, lifting, bending, sitting, standing, squatting, and reach over head  PARTICIPATION LIMITATIONS: meal prep, cleaning, laundry, driving, shopping, community activity, and yard work  PERSONAL FACTORS: Age, Behavior pattern, Past/current experiences, Time since onset of injury/illness/exacerbation, and 3+ comorbidities: arthritis, DM2, HTN, Obesity,   are also affecting patient's functional outcome.   REHAB POTENTIAL: Good  CLINICAL DECISION MAKING: Stable/uncomplicated  EVALUATION COMPLEXITY: Low   GOALS: Goals reviewed with patient? Yes  SHORT TERM GOALS: Target  date: 12/11/2022    Pt will be independent with HEP in order to improve strength of BLE and ROM of the neck in order to return to PLOF. Baseline: provided on neck visit Goal status: INITIAL    LONG TERM GOALS: Target date: 01/22/2023    Patient will demonstrate improved function as evidenced by a score of 68 on FOTO measure for full participation in activities at home and in the community. Baseline: 65. 10/23/22: 52. 5/15 56 for neck and CVA Goal status: IN PROGRESS  2.  Pt to improve NDI by 5 points or 10% in order to demonstrate a reduction in overall disability caused by neck pain at this time. Baseline: 15/50 or 30% disability 4/11 27/50  Goal status: NOT MET  3.  Pt to demonstrate improved cervical rotation to 60 deg bilaterally in order to perform tasks around the house that require turning of the head. Baseline: R/L Cervical Rotation: 49/30. 4/424 R:53. L:42  5/14: R45deg. L50deg  Goal status: IN PROGRESS  4.  Pt to report 0/10 pain in the neck following light household chores at home in order to demonstrate an overall reduction in pain and an improvement in  QoL. Baseline: 4/10 at rest 0/10 in neck Goal status: MET  4.  Pt will improve gait speed to <1.13m/s to indicate improved community mobility and reduced fall risk  Baseline: 0.54m/s  Goal status: revised   5. Pt will increase 6 min walk test >1071ft  Baseline: 965ft with SPC Goal status: in progress.   PLAN:  PT FREQUENCY: 1-2x/week  PT DURATION: 12 weeks  PLANNED INTERVENTIONS: Therapeutic exercises, Therapeutic activity, Neuromuscular re-education, Balance training, Gait training, Patient/Family education, Self Care, Joint mobilization, Joint manipulation, Stair training, Vestibular training, Canalith repositioning, Visual/preceptual remediation/compensation, DME instructions, Dry Needling, Cognitive remediation, Electrical stimulation, Spinal manipulation, Spinal mobilization, Cryotherapy, Moist heat, Traction,  Ultrasound, Manual therapy, and Re-evaluation    PLAN FOR NEXT SESSION:    Increased strengthening and dynamic balance training.   Manual therapy as needed for pain management on LLE.    Grier Rocher PT, DPT  Physical Therapist - [facility]  12:17 PM 12/04/22

## 2022-12-04 NOTE — Therapy (Signed)
OUTPATIENT PHYSICAL THERAPY  TREATMENT   Patient Name: Alyssa Mayo MRN: [id number] DOB:06/25/1946, 77 y.o., female Today's Date: 12/04/2022  END OF SESSION:   PT End of Session - 12/04/22 1109     Visit Number 21    Number of Visits 36    Date for PT Re-Evaluation 01/22/23    Authorization Type [employer]    Progress Note Due on Visit 30    PT Start Time 1106    PT Stop Time 1145    PT Time Calculation (min) 39 min    Equipment Utilized During Treatment Gait belt    Activity Tolerance Patient tolerated treatment well;No increased pain    Behavior During Therapy WFL for tasks assessed/performed                   Past Medical History:  Diagnosis Date   Abnormal levels of other serum enzymes 07/17/2015   Arthritis    Constipation 07/11/2015   COVID-19 03/06/2021   Diabetes mellitus without complication (HCC)    Elevated liver enzymes    Fatty liver    Generalized abdominal pain 07/11/2015   Hyperlipidemia    Hypertension    Lifelong obesity    Macular degeneration    Morbid obesity due to excess calories (HCC) 07/11/2015   Other fatigue 07/11/2015   Sleep apnea    Spinal headache    Past Surgical History:  Procedure Laterality Date   ABDOMINAL HYSTERECTOMY     APPENDECTOMY     CATARACT EXTRACTION     CERVICAL LAMINECTOMY  07/21/1985   CESAREAN SECTION     COLONOSCOPY  07/21/2012   diverticulosis, [facility] Dr. Skulski    COLONOSCOPY WITH PROPOFOL N/A 02/04/2019   Procedure: COLONOSCOPY WITH PROPOFOL;  Surgeon: Skulskie, Martin U, MD;  Location: [facility];  Service: Endoscopy;  Laterality: N/A;   COLONOSCOPY WITH PROPOFOL N/A 03/18/2021   Procedure: COLONOSCOPY WITH PROPOFOL;  Surgeon: Locklear, Cameron T, MD;  Location: [facility];  Service: Endoscopy;  Laterality: N/A;  DM   DIAGNOSTIC LAPAROSCOPY     ESOPHAGOGASTRODUODENOSCOPY (EGD) WITH PROPOFOL N/A 02/04/2019   Procedure: ESOPHAGOGASTRODUODENOSCOPY (EGD) WITH PROPOFOL;  Surgeon:  Skulskie, Martin U, MD;  Location: [facility];  Service: Endoscopy;  Laterality: N/A;   ESOPHAGOGASTRODUODENOSCOPY (EGD) WITH PROPOFOL N/A 03/18/2021   Procedure: ESOPHAGOGASTRODUODENOSCOPY (EGD) WITH PROPOFOL;  Surgeon: Locklear, Cameron T, MD;  Location: [facility];  Service: Endoscopy;  Laterality: N/A;   LOOP RECORDER INSERTION N/A 08/05/2021   Procedure: LOOP RECORDER INSERTION;  Surgeon: Taylor, Gregg W, MD;  Location: [facility];  Service: Cardiovascular;  Laterality: N/A;   SPINE SURGERY     TUBAL LIGATION     Patient Active Problem List   Diagnosis Date Noted   UTI (urinary tract infection) 09/01/2022   COVID-19 virus infection 09/01/2022   Chronic diastolic CHF (congestive heart failure) (HCC) 09/01/2022   Chronic kidney disease, stage 3a (HCC) 09/01/2022   Obesity (BMI 30-39.9) 09/01/2022   Paroxysmal atrial fibrillation (HCC) 08/08/2022   Hypercoagulable state due to paroxysmal atrial fibrillation (HCC) 08/08/2022   Cerebral edema (HCC) 08/07/2021   OSA (obstructive sleep apnea) 08/07/2021   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   CVA (cerebral vascular accident) (HCC) 08/01/2021   GERD (gastroesophageal reflux disease) 07/20/2019   Advanced care planning/counseling discussion 12/17/2016   Skin lesions, generalized 11/13/2016   Chronic right hip pain 06/17/2016   Vitamin D deficiency 09/26/2015   Diastasis recti 08/08/2015   Elevated serum GGT   level 07/24/2015   Essential hypertension 07/17/2015   Fatty liver 07/17/2015   Elevated alkaline phosphatase level 07/17/2015   Elevated serum glutamic pyruvic transaminase (SGPT) level 07/17/2015   Abnormal finding on EKG 07/11/2015   Morbid obesity (HCC) 07/11/2015   Colon, diverticulosis 07/11/2015   Abdominal wall hernia 07/11/2015   DM (diabetes mellitus), type 2 with neurological complications (HCC)    Hyperlipidemia     PCP: Hedrick, James, MD  REFERRING PROVIDER: Hedrick, James, MD &  Mundy, Jonathan Todd, PA  REFERRING DIAG: I63.20 (ICD-10-CM) - Cerebral infarction due to unspecified occlusion or stenosis of unspecified precerebral arteries, Cervicalgia with degenerative disc  THERAPY DIAG:  Muscle weakness (generalized)  Other lack of coordination  Difficulty in walking, not elsewhere classified  Cervical pain  Pain in left hip  Painful cervical ROM  Apraxia  Unsteadiness on feet  Rationale for Evaluation and Treatment: Rehabilitation  ONSET DATE: 09/03/22  SUBJECTIVE:                                                                                                                                                                                                         SUBJECTIVE STATEMENT:  Pt reports no trips or LOB since prior PT treatment. No pain on this day. States that she had stiffness and pain in the R hand yesterday, limiting movement, but it resolved overnight.   PERTINENT HISTORY:   Per chart and confirmed by pt: Pt is a 77 yo female with RMCA CVA infarction with hemorrhagic transformation 08/01/2021, with inpatient rehab 08/07/2021-08/22/2021 followed by HH therapy until June. Pt now currently being seen for outpatient OT and ST services. Pt reports limitations in stamina persist. Other PMH is significant for HTN, HLD, macular degeneration, DM, obesity, sleep apnea, arthritis, chronic R hip pain, vitamin D deficiency, diastasis recti, abdominal wall hernia, hx of abdominal hysterectomy, appendectomy, spine surgery (years ago).   PAIN:  Are you having pain? Yes: NPRS scale: 2/10 Pain location:  L hip/L knee  Pain description: dull ache Aggravating factors: sitting Relieving factors: laying down  PRECAUTIONS: None  WEIGHT BEARING RESTRICTIONS: No  FALLS:  Has patient fallen in last 6 months? Yes. Number of falls 2  LIVING ENVIRONMENT: Lives with: lives with their son Lives in: House/apartment Stairs: Yes: Internal: 13 steps; doesn't go up to  the second floor and External: 4 steps; can reach both Has following equipment at home: Single point cane and Walker - 2 wheeled  OCCUPATION: Retired  PLOF: Independent with basic ADLs  PATIENT GOALS: "Get stronger. And reduce fall risk.   Reduce reliance on cane"  NEXT MD VISIT: end of May.   OBJECTIVE:   DIAGNOSTIC FINDINGS:    CT HEAD WITHOUT CONTRAST  CT CERVICAL SPINE WITHOUT CONTRAST IMPRESSION: 1. No acute intracranial pathology. 2. Expected interval evolution of encephalomalacia of the right frontal lobe, following intraparenchymal hemorrhage seen acutely on examination dated 08/03/2021. 3. No fracture or static subluxation of the cervical spine. 4. Generally mild disc space height loss and osteophytosis throughout the cervical spine, with partial ankylosis of C5-C6 and a large left eccentric posterior bridging osteophyte at this level. MRI may be used to further evaluate cervical disc and neural foraminal pathology if indicated by neurologically localizing signs and symptoms.  EXAM: xRay PELVIS - 1-2 VIEW  IMPRESSION: 1. No convincing evidence of acute fracture or diastasis of the pelvis. 2. Well corticated osseous focus beneath the right inferior pubic ramus may reflect sequela of avulsion injury or chronic tendinopathy, suggest correlation with direct tenderness to palpation.   PATIENT SURVEYS:  NDI 15/50 = 30% disability FOTO 65/68   10/23/2022 : 52/68  10/23/2022 Foto CVA 57/59  COGNITION: Overall cognitive status: History of cognitive impairments - at baseline  SENSATION: WFL  POSTURE: rounded shoulders and forward head  PALPATION:     CERVICAL ROM:   Active ROM A/PROM (deg) eval 3/21 4/4  Flexion 39 25 39  Extension 10 24 25  Right lateral flexion 7 15 15  Left lateral flexion 6 20 14  Right rotation 49 53 41  Left rotation 30 42 35   (Blank rows = not tested)  UPPER EXTREMITY ROM:  Active ROM Right eval Left eval Right 4/4  Left 4/4  Shoulder flexion 128 81 149 22  Shoulder abduction 160 68  158  38   (Blank rows = not tested)   UPPER EXTREMITY MMT:  Grossly decreased (3+/5) in the L UE due to pain and lack of ROM   CERVICAL SPECIAL TESTS:  Spurling's test: Negative and Sharp pursor's test: Negative      LOWER EXTREMITY MMT:    MMT Right 3/21 Left 3/21  Hip flexion 4+ 4-  Hip extension 4+ 4-  Hip abduction 4+ 4  Hip adduction 4+ 4  Hip internal rotation    Hip external rotation    Knee flexion 4+ 4-  Knee extension 4+ 4  Ankle dorsiflexion 4+ 4  Ankle plantarflexion    Ankle inversion    Ankle eversion     (Blank rows = not tested)  LOWER EXTREMITY ROM:     Passive  Right 3/21 Left 3/21  Hip flexion 120 105  Hip extension    Hip abduction    Hip adduction    Hip internal rotation    Hip external rotation    Knee flexion WFL WFL  Knee extension (in Hip flexion 90deg) 143 120  Ankle dorsiflexion    Ankle plantarflexion    Ankle inversion    Ankle eversion     (Blank rows = not tested)    LOWER EXTREMITY SPECIAL TESTS:  Hip special tests: Patrick (FABER) test: positive , Hip scouring test: negative, and distraction: negative    FUNCTIONAL TESTS:  6 minute walk test: 918ft with SPC 10 meter walk test: 0.85m/s Not performed at this time.  TODAY'S TREATMENT: DATE: 12/04/22  Gait without AD x 160ft.    Standing therex.  Hip abduction x 12  Hip flexion x 10 bil  Step up/doe 6inch step 2 x 5 BLE   Seated therex:    LAQ 3 # ankle weight. X 12  Reciprocal march 3 # ankle weight. X 12   Hip abduction RTB  x 12 with 2 sec hold Isometric Hip x 12 with 3 sec hold  Ankle PF/DF 3# ankle x 15    Dynamic gait to step over 2 canes and half bolster on floor 2 x 3 with supervision assist. Increased step length noted on the LLE with increased repetitions.   No LOB or pain noted by pt throughout session   PATIENT EDUCATION:  Education details: Pt educated throughout session  about proper posture and technique with exercises. Improved exercise technique, movement at target joints, use of target muscles after min to mod verbal, visual, tactile cues.  Person educated: Patient and   Education method: Explanation Education comprehension: verbalized understanding  HOME EXERCISE PROGRAM:  Access Code: [email or web address] URL: [email or web address] Date: 09/18/2022 Prepared by: Josh Robbins  Exercises - Seated Upper Trapezius Stretch  - 1 x daily - 7 x weekly - 3 sets - 10 reps - Seated Levator Scapulae Stretch  - 1 x daily - 7 x weekly - 3 sets - 10 reps - Seated Scapular Retraction  - 1 x daily - 7 x weekly - 3 sets - 10 reps  ASSESSMENT:  CLINICAL IMPRESSION: Patient presents with good motivation for completion of physical therapy activities. Pt able to complete all BLE strengthening exercises without increased pain on this day and no LOB. Pt continues to report no cervical pain, but severely limited ROM on the LUE. Pt is being seen by OT for LUE strength.  Pt will continue to benefit from skilled physical therapy intervention to address impairments, improve QOL, and attain therapy goals.       OBJECTIVE IMPAIRMENTS: decreased activity tolerance, decreased cognition, decreased ROM, decreased strength, decreased safety awareness, hypomobility, and pain.   ACTIVITY LIMITATIONS: carrying, lifting, bending, sitting, standing, squatting, and reach over head  PARTICIPATION LIMITATIONS: meal prep, cleaning, laundry, driving, shopping, community activity, and yard work  PERSONAL FACTORS: Age, Behavior pattern, Past/current experiences, Time since onset of injury/illness/exacerbation, and 3+ comorbidities: arthritis, DM2, HTN, Obesity,   are also affecting patient's functional outcome.   REHAB POTENTIAL: Good  CLINICAL DECISION MAKING: Stable/uncomplicated  EVALUATION COMPLEXITY: Low   GOALS: Goals reviewed with patient? Yes  SHORT TERM GOALS: Target  date: 12/11/2022    Pt will be independent with HEP in order to improve strength of BLE and ROM of the neck in order to return to PLOF. Baseline: provided on neck visit Goal status: INITIAL    LONG TERM GOALS: Target date: 01/22/2023    Patient will demonstrate improved function as evidenced by a score of 68 on FOTO measure for full participation in activities at home and in the community. Baseline: 65. 10/23/22: 52. 5/15 56 for neck and CVA Goal status: IN PROGRESS  2.  Pt to improve NDI by 5 points or 10% in order to demonstrate a reduction in overall disability caused by neck pain at this time. Baseline: 15/50 or 30% disability 4/11 27/50  Goal status: NOT MET  3.  Pt to demonstrate improved cervical rotation to 60 deg bilaterally in order to perform tasks around the house that require turning of the head. Baseline: R/L Cervical Rotation: 49/30. 4/424 R:53. L:42  5/14: R45deg. L50deg  Goal status: IN PROGRESS  4.  Pt to report 0/10 pain in the neck following light household chores at home in order to demonstrate an overall reduction in pain and an improvement in   QoL. Baseline: 4/10 at rest 0/10 in neck Goal status: MET  4.  Pt will improve gait speed to <1.0m/s to indicate improved community mobility and reduced fall risk  Baseline: 0.85m/s  Goal status: revised   5. Pt will increase 6 min walk test >1000ft  Baseline: 918ft with SPC Goal status: in progress.   PLAN:  PT FREQUENCY: 1-2x/week  PT DURATION: 12 weeks  PLANNED INTERVENTIONS: Therapeutic exercises, Therapeutic activity, Neuromuscular re-education, Balance training, Gait training, Patient/Family education, Self Care, Joint mobilization, Joint manipulation, Stair training, Vestibular training, Canalith repositioning, Visual/preceptual remediation/compensation, DME instructions, Dry Needling, Cognitive remediation, Electrical stimulation, Spinal manipulation, Spinal mobilization, Cryotherapy, Moist heat, Traction,  Ultrasound, Manual therapy, and Re-evaluation    PLAN FOR NEXT SESSION:    Increased strengthening and dynamic balance training.   Manual therapy as needed for pain management on LLE.    Cande Mastropietro PT, DPT  Physical Therapist - [place]  [facility]  12:17 PM 12/04/22   

## 2022-12-08 ENCOUNTER — Other Ambulatory Visit (HOSPITAL_COMMUNITY): Payer: Self-pay | Admitting: *Deleted

## 2022-12-08 MED ORDER — APIXABAN 5 MG PO TABS: 5.0000 mg | ORAL_TABLET | Freq: Two times a day (BID) | ORAL | 6 refills | Status: DC

## 2022-12-09 ENCOUNTER — Ambulatory Visit: Payer: Medicare HMO | Admitting: Physical Therapy

## 2022-12-09 ENCOUNTER — Ambulatory Visit: Payer: Medicare HMO | Admitting: Occupational Therapy

## 2022-12-09 DIAGNOSIS — M6281 Muscle weakness (generalized): Secondary | ICD-10-CM | POA: Diagnosis not present

## 2022-12-09 DIAGNOSIS — M542 Cervicalgia: Secondary | ICD-10-CM

## 2022-12-09 DIAGNOSIS — M25552 Pain in left hip: Secondary | ICD-10-CM

## 2022-12-09 DIAGNOSIS — R262 Difficulty in walking, not elsewhere classified: Secondary | ICD-10-CM

## 2022-12-09 DIAGNOSIS — R2681 Unsteadiness on feet: Secondary | ICD-10-CM

## 2022-12-09 DIAGNOSIS — R278 Other lack of coordination: Secondary | ICD-10-CM

## 2022-12-09 NOTE — Therapy (Signed)
OUTPATIENT PHYSICAL THERAPY  TREATMENT     Patient Name: Alyssa Mayo MRN: [id number] DOB:08-23-1945, 77 y.o., female Today's Date: 12/04/2022   END OF SESSION:    PT End of Session - 12/09/22 1026     Visit Number 22    Number of Visits 36    Date for PT Re-Evaluation 01/22/23    Authorization Type [employer]    Progress Note Due on Visit 30    PT Start Time 1018    PT Stop Time 1100    PT Time Calculation (min) 42 min    Equipment Utilized During Treatment Gait belt    Activity Tolerance Patient tolerated treatment well;No increased pain    Behavior During Therapy [facility] for tasks assessed/performed                                      Past Medical History:  Diagnosis Date   Abnormal levels of other serum enzymes 07/17/2015   Arthritis     Constipation 07/11/2015   COVID-19 03/06/2021   Diabetes mellitus without complication (HCC)     Elevated liver enzymes     Fatty liver     Generalized abdominal pain 07/11/2015   Hyperlipidemia     Hypertension     Lifelong obesity     Macular degeneration     Morbid obesity due to excess calories (HCC) 07/11/2015   Other fatigue 07/11/2015   Sleep apnea     Spinal headache           Past Surgical History:  Procedure Laterality Date   ABDOMINAL HYSTERECTOMY       APPENDECTOMY       CATARACT EXTRACTION       CERVICAL LAMINECTOMY   07/21/1985   CESAREAN SECTION       COLONOSCOPY   07/21/2012    diverticulosis, [facility] Dr. [name]    COLONOSCOPY WITH PROPOFOL N/A 02/04/2019    Procedure: COLONOSCOPY WITH PROPOFOL;  Surgeon: [name], MD;  Location: [facility] ENDOSCOPY;  Service: Endoscopy;  Laterality: N/A;   COLONOSCOPY WITH PROPOFOL N/A 03/18/2021    Procedure: COLONOSCOPY WITH PROPOFOL;  Surgeon: [name], MD;  Location: [facility] ENDOSCOPY;  Service: Endoscopy;  Laterality: N/A;  DM   DIAGNOSTIC LAPAROSCOPY       ESOPHAGOGASTRODUODENOSCOPY (EGD) WITH PROPOFOL N/A 02/04/2019    Procedure:  ESOPHAGOGASTRODUODENOSCOPY (EGD) WITH PROPOFOL;  Surgeon: [name], MD;  Location: [facility] ENDOSCOPY;  Service: Endoscopy;  Laterality: N/A;   ESOPHAGOGASTRODUODENOSCOPY (EGD) WITH PROPOFOL N/A 03/18/2021    Procedure: ESOPHAGOGASTRODUODENOSCOPY (EGD) WITH PROPOFOL;  Surgeon: [name], MD;  Location: [facility] ENDOSCOPY;  Service: Endoscopy;  Laterality: N/A;   LOOP RECORDER INSERTION N/A 08/05/2021    Procedure: LOOP RECORDER INSERTION;  Surgeon: [name], MD;  Location: [facility] INVASIVE CV LAB;  Service: Cardiovascular;  Laterality: N/A;   SPINE SURGERY       TUBAL LIGATION            Patient Active Problem List    Diagnosis Date Noted   UTI (urinary tract infection) 09/01/2022   COVID-19 virus infection 09/01/2022   Chronic diastolic CHF (congestive heart failure) (HCC) 09/01/2022   Chronic kidney disease, stage 3a (HCC) 09/01/2022   Obesity (BMI 30-39.9) 09/01/2022   Paroxysmal atrial fibrillation (HCC) 08/08/2022   Hypercoagulable state due to paroxysmal atrial fibrillation (HCC) 08/08/2022   Cerebral edema (HCC) 08/07/2021  OSA (obstructive sleep apnea) 08/07/2021   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   CVA (cerebral vascular accident) (HCC) 08/01/2021   GERD (gastroesophageal reflux disease) 07/20/2019   Advanced care planning/counseling discussion 12/17/2016   Skin lesions, generalized 11/13/2016   Chronic right hip pain 06/17/2016   Vitamin D deficiency 09/26/2015   Diastasis recti 08/08/2015   Elevated serum GGT level 07/24/2015   Essential hypertension 07/17/2015   Fatty liver 07/17/2015   Elevated alkaline phosphatase level 07/17/2015   Elevated serum glutamic pyruvic transaminase (SGPT) level 07/17/2015   Abnormal finding on EKG 07/11/2015   Morbid obesity (HCC) 07/11/2015   Colon, diverticulosis 07/11/2015   Abdominal wall hernia 07/11/2015   DM (diabetes mellitus), type 2 with neurological complications (HCC)     Hyperlipidemia         PCP: [name], MD   REFERRING PROVIDER: [name], MD & Edilia Bo, [place]   REFERRING DIAG: [phone] (ICD-10-CM) - Cerebral infarction due to unspecified occlusion or stenosis of unspecified precerebral arteries, Cervicalgia with degenerative disc   THERAPY DIAG:  Muscle weakness (generalized)   Other lack of coordination   Difficulty in walking, not elsewhere classified   Cervical pain   Pain in left hip   Painful cervical ROM   Apraxia   Unsteadiness on feet   Rationale for Evaluation and Treatment: Rehabilitation   ONSET DATE: 09/03/22   SUBJECTIVE:                                                                                                                                                                                                          SUBJECTIVE STATEMENT:   Pt reports no trips or LOB since prior PT treatment. No pain on this day. Will be going to son's house over the weekend. Reports that she will be staying in upstairs bedroom, so she will need to manage going up/down steps    PERTINENT HISTORY:    Per chart and confirmed by pt: Pt is a 77 yo female with RMCA CVA infarction with hemorrhagic transformation 08/01/2021, with inpatient rehab 08/07/2021-08/22/2021 followed by [facility] therapy until June. Pt now currently being seen for outpatient OT and ST services. Pt reports limitations in stamina persist. Other PMH is significant for HTN, HLD, macular degeneration, DM, obesity, sleep apnea, arthritis, chronic R hip pain, vitamin D deficiency, diastasis recti, abdominal wall hernia, hx of abdominal hysterectomy, appendectomy, spine surgery (years ago).    PAIN:  Are you having pain? Yes: NPRS scale: 2/10 Pain location:  L hip/L knee  Pain  description: dull ache Aggravating factors: sitting Relieving factors: laying down   PRECAUTIONS: None   WEIGHT BEARING RESTRICTIONS: No   FALLS:  Has patient fallen in last 6 months? Yes. Number of falls 2    LIVING ENVIRONMENT: Lives with: lives with their son Lives in: House/apartment Stairs: Yes: Internal: 13 steps; doesn't go up to the second floor and External: 4 steps; can reach both Has following equipment at home: Single point cane and Walker - 2 wheeled   OCCUPATION: Retired   PLOF: Independent with basic ADLs   PATIENT GOALS: "Get stronger. And reduce fall risk. Reduce reliance on cane"   NEXT MD VISIT: end of May.    OBJECTIVE:    DIAGNOSTIC FINDINGS:      CT HEAD WITHOUT CONTRAST  CT CERVICAL SPINE WITHOUT CONTRAST IMPRESSION: 1. No acute intracranial pathology. 2. Expected interval evolution of encephalomalacia of the right frontal lobe, following intraparenchymal hemorrhage seen acutely on examination dated 08/03/2021. 3. No fracture or static subluxation of the cervical spine. 4. Generally mild disc space height loss and osteophytosis throughout the cervical spine, with partial ankylosis of C5-C6 and a large left eccentric posterior bridging osteophyte at this level. MRI may be used to further evaluate cervical disc and neural foraminal pathology if indicated by neurologically localizing signs and symptoms.   EXAM: xRay PELVIS - 1-2 VIEW  IMPRESSION: 1. No convincing evidence of acute fracture or diastasis of the pelvis. 2. Well corticated osseous focus beneath the right inferior pubic ramus may reflect sequela of avulsion injury or chronic tendinopathy, suggest correlation with direct tenderness to palpation.     PATIENT SURVEYS:  NDI 15/50 = 30% disability FOTO 65/68   10/23/2022 : 52/68  10/23/2022 Foto CVA 57/59   COGNITION: Overall cognitive status: History of cognitive impairments - at baseline   SENSATION: WFL   POSTURE: rounded shoulders and forward head   PALPATION:  tender to touch on L ischial tuberosity.                CERVICAL ROM:    Active ROM A/PROM (deg) eval 3/21 4/4  Flexion 39 25 39  Extension 10 24 25   Right lateral  flexion 7 15 15   Left lateral flexion 6 20 14   Right rotation 49 53 41  Left rotation 30 42 35   (Blank rows = not tested)   UPPER EXTREMITY ROM:   Active ROM Right eval Left eval Right 4/4 Left 4/4  Shoulder flexion 128 81 149 22  Shoulder abduction 160 68  158  38   (Blank rows = not tested)     UPPER EXTREMITY MMT:   Grossly decreased (3+/5) in the L UE due to pain and lack of ROM     CERVICAL SPECIAL TESTS:  Spurling's test: Negative and Sharp pursor's test: Negative           LOWER EXTREMITY MMT:     MMT Right 3/21 Left 3/21  Hip flexion 4+ 4-  Hip extension 4+ 4-  Hip abduction 4+ 4  Hip adduction 4+ 4  Hip internal rotation      Hip external rotation      Knee flexion 4+ 4-  Knee extension 4+ 4  Ankle dorsiflexion 4+ 4  Ankle plantarflexion      Ankle inversion      Ankle eversion       (Blank rows = not tested)   LOWER EXTREMITY ROM:      Passive  Right 3/21 Left  3/21  Hip flexion 120 105  Hip extension      Hip abduction      Hip adduction      Hip internal rotation      Hip external rotation      Knee flexion [facility] WFL  Knee extension (in Hip flexion 90deg) 143 120  Ankle dorsiflexion      Ankle plantarflexion      Ankle inversion      Ankle eversion       (Blank rows = not tested)      LOWER EXTREMITY SPECIAL TESTS:  Hip special tests: Luisa Hart (FABER) test: positive , Hip scouring test: negative, and distraction: negative     FUNCTIONAL TESTS:  6 minute walk test: 964ft with SPC 10 meter walk test: 0.36m/s Not performed at this time.   TODAY'S TREATMENT: DATE: 12/04/22  Nustep BUE/BLE x 5 min level 2. Cues to keep wihtin pain free range on the LUE.  Standing 3 way hip with RTB x 15 each bil.   Standing on airex pad:  1 LE up on disk on 6inch step.  Reciprocal foot tap on disk on step x 12  Lateral foot tap on disk on 6inch step x 10 bil  Lateral trunk rotation to tap ball on bars x 15 bil  Bicep curl to lift ball to  shoulders x 15  Ball toss x 15   Seated hip abduction x 12 with RTB.   Therapeutic rest breaks provided by PT due to fatigue  in the LLE.    PATIENT EDUCATION:  Education details: Pt educated throughout session about proper posture and technique with exercises. Improved exercise technique, movement at target joints, use of target muscles after min to mod verbal, visual, tactile cues.   Person educated: Patient and   Education method: Explanation Education comprehension: verbalized understanding   HOME EXERCISE PROGRAM:   Access Code: [email or web address] URL: [email or web address] Date: 09/18/2022 Prepared by: Tomasa Hose   Exercises - Seated Upper Trapezius Stretch  - 1 x daily - 7 x weekly - 3 sets - 10 reps - Seated Levator Scapulae Stretch  - 1 x daily - 7 x weekly - 3 sets - 10 reps - Seated Scapular Retraction  - 1 x daily - 7 x weekly - 3 sets - 10 reps   ASSESSMENT:   CLINICAL IMPRESSION: Patient presents with good motivation for completion of physical therapy activities. Pt noted to have decreased WB through the LLE unless instructed by PT for symmetrical WB in stance. Use of intermittent UE support for modified tandem/SLS stance on airex pad as well as cues for improved posture to reduce compensatory movements for hip strengthening activities.  Pt will continue to benefit from skilled physical therapy intervention to address impairments, improve QOL, and attain therapy goals.            OBJECTIVE IMPAIRMENTS: decreased activity tolerance, decreased cognition, decreased ROM, decreased strength, decreased safety awareness, hypomobility, and pain.    ACTIVITY LIMITATIONS: carrying, lifting, bending, sitting, standing, squatting, and reach over head   PARTICIPATION LIMITATIONS: meal prep, cleaning, laundry, driving, shopping, community activity, and yard work   PERSONAL FACTORS: Age, Behavior pattern, Past/current experiences, Time since onset of  injury/illness/exacerbation, and 3+ comorbidities: arthritis, DM2, HTN, Obesity,   are also affecting patient's functional outcome.    REHAB POTENTIAL: Good   CLINICAL DECISION MAKING: Stable/uncomplicated   EVALUATION COMPLEXITY: Low     GOALS: Goals reviewed with patient? Yes   SHORT TERM  GOALS: Target date: 12/11/2022       Pt will be independent with HEP in order to improve strength of BLE and ROM of the neck in order to return to PLOF. Baseline: provided on neck visit Goal status: INITIAL       LONG TERM GOALS: Target date: 01/22/2023       Patient will demonstrate improved function as evidenced by a score of 68 on FOTO measure for full participation in activities at home and in the community. Baseline: 65. 10/23/22: 52. 5/15 56 for neck and CVA Goal status: IN PROGRESS   2.  Pt to improve NDI by 5 points or 10% in order to demonstrate a reduction in overall disability caused by neck pain at this time. Baseline: 15/50 or 30% disability 4/11 27/50  Goal status: NOT MET   3.  Pt to demonstrate improved cervical rotation to 60 deg bilaterally in order to perform tasks around the house that require turning of the head. Baseline: R/L Cervical Rotation: 49/30. 4/424 R:53. L:42  5/14: R45deg. L50deg  Goal status: IN PROGRESS   4.  Pt to report 0/10 pain in the neck following light household chores at home in order to demonstrate an overall reduction in pain and an improvement in QoL. Baseline: 4/10 at rest 0/10 in neck Goal status: MET   4.  Pt will improve gait speed to <1.53m/s to indicate improved community mobility and reduced fall risk  Baseline: 0.38m/s  Goal status: revised    5. Pt will increase 6 min walk test >1075ft  Baseline: 917ft with SPC Goal status: in progress.    PLAN:   PT FREQUENCY: 1-2x/week   PT DURATION: 12 weeks   PLANNED INTERVENTIONS: Therapeutic exercises, Therapeutic activity, Neuromuscular re-education, Balance training, Gait training,  Patient/Family education, Self Care, Joint mobilization, Joint manipulation, Stair training, Vestibular training, Canalith repositioning, Visual/preceptual remediation/compensation, DME instructions, Dry Needling, Cognitive remediation, Electrical stimulation, Spinal manipulation, Spinal mobilization, Cryotherapy, Moist heat, Traction, Ultrasound, Manual therapy, and Re-evaluation       PLAN FOR NEXT SESSION:      Increased strengthening and dynamic balance training.    Manual therapy as needed for pain management on LLE.      Grier Rocher PT, DPT  Physical Therapist - [facility]  12:17 PM 12/04/22

## 2022-12-09 NOTE — Therapy (Signed)
Occupational Therapy Neuro Treatment Note   Patient Name: Alyssa Mayo MRN: [id number] DOB:12/19/1945, 77 y.o., female Today's Date: 03/14/2022   PCP: [name], MD REFERRING PROVIDER: [name], NP     OT End of Session - 12/09/22 1134     Visit Number 22    Date for OT Re-Evaluation 02/24/23    Authorization Type Progress reporting period starting 09/16/2022    OT Start Time 1100    OT Stop Time 1145    OT Time Calculation (min) 45 min    Activity Tolerance Patient tolerated treatment well    Behavior During Therapy [facility] for tasks assessed/performed                                    Past Medical History:  Diagnosis Date   Abnormal levels of other serum enzymes 07/17/2015   Arthritis     Constipation 07/11/2015   COVID-19 03/06/2021   Diabetes mellitus without complication (HCC)     Elevated liver enzymes     Fatty liver     Generalized abdominal pain 07/11/2015   Hyperlipidemia     Hypertension     Lifelong obesity     Macular degeneration     Morbid obesity due to excess calories (HCC) 07/11/2015   Other fatigue 07/11/2015   Sleep apnea     Spinal headache           Past Surgical History:  Procedure Laterality Date   ABDOMINAL HYSTERECTOMY       APPENDECTOMY       CATARACT EXTRACTION       CERVICAL LAMINECTOMY   07/21/1985   CESAREAN SECTION       COLONOSCOPY   07/21/2012    diverticulosis, [facility] Dr. [name]    COLONOSCOPY WITH PROPOFOL N/A 02/04/2019    Procedure: COLONOSCOPY WITH PROPOFOL;  Surgeon: [name], MD;  Location: [facility] ENDOSCOPY;  Service: Endoscopy;  Laterality: N/A;   COLONOSCOPY WITH PROPOFOL N/A 03/18/2021    Procedure: COLONOSCOPY WITH PROPOFOL;  Surgeon: [name], MD;  Location: [facility] ENDOSCOPY;  Service: Endoscopy;  Laterality: N/A;  DM   DIAGNOSTIC LAPAROSCOPY       ESOPHAGOGASTRODUODENOSCOPY (EGD) WITH PROPOFOL N/A 02/04/2019    Procedure: ESOPHAGOGASTRODUODENOSCOPY (EGD) WITH PROPOFOL;   Surgeon: [name], MD;  Location: [facility] ENDOSCOPY;  Service: Endoscopy;  Laterality: N/A;   ESOPHAGOGASTRODUODENOSCOPY (EGD) WITH PROPOFOL N/A 03/18/2021    Procedure: ESOPHAGOGASTRODUODENOSCOPY (EGD) WITH PROPOFOL;  Surgeon: [name], MD;  Location: [facility] ENDOSCOPY;  Service: Endoscopy;  Laterality: N/A;   LOOP RECORDER INSERTION N/A 08/05/2021    Procedure: LOOP RECORDER INSERTION;  Surgeon: [name], MD;  Location: [facility] INVASIVE CV LAB;  Service: Cardiovascular;  Laterality: N/A;   SPINE SURGERY       TUBAL LIGATION            Patient Active Problem List    Diagnosis Date Noted   Paroxysmal atrial fibrillation (HCC) 08/08/2022   Hypercoagulable state due to paroxysmal atrial fibrillation (HCC) 08/08/2022   Cerebral edema (HCC) 08/07/2021   OSA (obstructive sleep apnea) 08/07/2021   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   CVA (cerebral vascular accident) (HCC) 08/01/2021   GERD (gastroesophageal reflux disease) 07/20/2019   Advanced care planning/counseling discussion 12/17/2016   Skin lesions, generalized 11/13/2016   Chronic right hip pain 06/17/2016   Vitamin D deficiency 09/26/2015  Diastasis recti 08/08/2015   Elevated serum GGT level 07/24/2015   Essential hypertension 07/17/2015   Fatty liver 07/17/2015   Elevated alkaline phosphatase level 07/17/2015   Elevated serum glutamic pyruvic transaminase (SGPT) level 07/17/2015   Abnormal finding on EKG 07/11/2015   Morbid obesity (HCC) 07/11/2015   Colon, diverticulosis 07/11/2015   Abdominal wall hernia 07/11/2015   DM (diabetes mellitus), type 2 with neurological complications (HCC)     Hyperlipidemia        ONSET DATE: 08/01/2021   REFERRING DIAG: CVA   THERAPY DIAG:  Muscle weakness (generalized)   Rationale for Evaluation and Treatment Rehabilitation   SUBJECTIVE:    SUBJECTIVE STATEMENT:    Pt. reports planning to visit her son in [place] over Memorial Day weekend, and  plans to take a train back.  Pt accompanied by: self   PERTINENT HISTORY:  Pt. is a 77 y.o. female who had a unexpectedly hospitalized from 2/12-2/14/2024 for COVID-19, and a UTI.  Pt. 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 Pt. attended inpatient rehab from 08/07/2021-08/22/2021. Pt. received Home health therapy services this past spring.  Pt. has recently had an assessment through driver rehabilitation services at [facility] with recommendations for referrals for  outpatient OT/PT, and ST services. Pt. PMHx includes: HTN, Hyperlipidemia, Macular degeneration, DM, and Obesity.   PRECAUTIONS: None   WEIGHT BEARING RESTRICTIONS No   PAIN:  Are you having pain?  1/10 pain   FALLS: Has patient fallen in last 6 months? Yes.   LIVING ENVIRONMENT: Lives with: lives with their family  Son  [name] Lives in: House/apartment Main living are on one floor Stairs:  2 steps to enter Has following equipment at home: Single point cane, Wheelchair (manual), shower chair, Grab bars, bed rail, and rubber mat.   PLOF: Independent   PATIENT GOALS: To be able to Drive, and cook again   OBJECTIVE:    HAND DOMINANCE: Right    ADLs:  Transfers/ambulation related to ADLs: Independent Eating: Independent Grooming: [employer] Pt. reports being unable to use her left hand to perform haircare.  UB Dressing: Independent pullover shirt, independent now with fastening a bra LB Dressing: Independent donning pants socks, and slide on shoes Toileting: Independent Bathing: Independent Tub Shower transfers: Walk-in shower Independent now     IADLs: Shopping: Needs to be accompanied to the grocery store. Light housekeeping: Do  Meal Prep:  Is able to perform light meal prep Community mobility: Relies on son, and friend Medication management: Son sets up Mining engineer management:TBD Handwriting: TBD   MOBILITY STATUS: Hx of falls out of bed      ACTIVITY TOLERANCE: Activity tolerance:  10-20 min. Before rest break   FUNCTIONAL OUTCOME MEASURES: FOTO: 61  TR score: 62  10/09/2022: FOTO: 49   UPPER EXTREMITY ROM      Active ROM Right eval Left eval Left  10/09/2022 Left 10/23/22 Left 12/02/2022  Shoulder flexion WFL 54(74) scaption 0(55) 26(80) 32(82)  Shoulder abduction WFL 58(75) 32(54) 32(50) 45(60)  Shoulder adduction         Shoulder extension         Shoulder internal rotation         Shoulder external rotation         Elbow flexion [facility] WFL 145 145 145  Elbow extension [facility] WFL 10(0) 10(0) WFL  Wrist flexion WFL TBD [facility] WFL  Wrist extension WFL TBD [facility] WFL  Wrist ulnar deviation  Wrist radial deviation         Wrist pronation         Wrist supination         (Blank rows = not tested)     UPPER EXTREMITY MMT:      MMT Right eval Left eval Left 10/09/2022 Left 10/23/2022 Left 12/02/2022  Shoulder flexion 4+/5 2+/5 1/5 2-/5 2-/5  Shoulder abduction 4+/5 2+/5 2-/5 2-/5 2-/5  Shoulder adduction         Shoulder extension         Shoulder internal rotation         Shoulder external rotation         Middle trapezius         Lower trapezius         Elbow flexion 4+/5 3+/5 3/5 3/5 3+/5  Elbow extension 4+/5 3+/5 3/5 3/5 3+/5  Wrist flexion 4+/5 TBD 3/5 3/5 3+/5  Wrist extension 4+/5 TBD 3/5 3/5 3+/5  Wrist ulnar deviation         Wrist radial deviation         Wrist pronation         Wrist supination         (Blank rows = not tested)   HAND FUNCTION: Grip strength: Right: 33 lbs; Left: 11 lbs, Lateral pinch: Right: 17 lbs, Left: 2 lbs, and 3 point pinch: Right: 17 lbs, Left: 2 lbs  10/09/2022: Grip strength: Right: 33 lbs; Left: 10 lbs, Lateral pinch: Right: 17 lbs, Left: 1 lbs, and 3 point pinch: Right: 17 lbs, Left: 1 lbs   10/23/2022: Grip strength: Right: 33 lbs; Left: 16 lbs, Lateral pinch: Right: 17 lbs, Left: 1 lbs, and 3 point pinch: Right: 17 lbs, Left: 1 lbs  12/02/2022: Grip  strength: Right: 33 lbs; Left: 17 lbs, Lateral pinch: Right: 17 lbs, Left: 7 lbs, and 3 point pinch: Right: 17 lbs, Left: 3 lbs     COORDINATION:   9 Hole Peg test: Right: 27 sec.  sec; Left: 52 sec.  10/09/2022: 9 Hole Peg test: Right: 27 sec.  sec; Left: 1 min. & 35 sec.  SENSATION: Light touch: WFL Proprioception: WFL   COGNITION: Overall cognitive status: Within functional limits for tasks assessed   VISION: Subjective report: Glasses. Just received a new prescription to increase bifocal strength Baseline vision: Macular Degeneration Visual history: macular degeneration   VISION ASSESSMENT: To be further assessed in functional context  Left sided Awareness      PERCEPTION: Limited left sided awareness     TODAY'S TREATMENT:   Therapeutic Exercise:               Pt. tolerated PROM in all joint ranges of the left shoulder. Pt. worked on [employer], and reciprocal motion using the UBE while seated for 8 min. with no resistance initially, and the resistance gradually increasing. Constant monitoring was provided. AAROM using an incline wedge at the tabletop.     Neuromuscular re-education:    Pt. performed [facility] tasks using the Grooved pegboard. Pt. worked on grasping the grooved pegs from a horizontal position, and moving the pegs to a vertical position in the hand to prepare for placing them in the grooved slot.  Pt. Worked on translatory movements  moving the grooved pegs through her hand in preparation for placing them into the pegboard. Pt. Worked on left hand [facility] skills grasping small 1/2" circular tipped pegs, and moving them through her hand.  PATIENT EDUCATION: Education details: Compensatory strategies during typing. Person educated: Patient Education method: verbal cues Education comprehension: verbalized understanding and pt does not plan to cook unless son is present.     HOME EXERCISE PROGRAM:  Reviewed home activities to enhance left hand digit  extension  during typing skills.   GOALS: Goals reviewed with patient? Yes     SHORT TERM GOALS: Target date: 01/13/2023       Pt. Will improve FOTO score by 2 points to reflect improved pt. perceived functional performance Baseline: 12/01/2022: FOTO: 51 10/23/2022: FOTO 49 10/09/2022: FOTO 49, FOTO: 61 TR score: 62  Goal status: INITIAL   LONG TERM GOALS: Target date:02/24/2023  Pt. Will improve left shoulder ROM by 10 degrees to be able able to efficiently apply deodorant Baseline: 12/02/2022: Shoulder flexion: 32(82), Abduction: 45(60)  10/23/2022: Shoulder flexion: 26(80), Abduction: 32(50) 10/09/2022: flexion: 0(55), Abduction: 32(54) Goal status: Improving, but very limited.   2.  Pt. will be able to independently hold and use a blow dryer, and brush for hair care. Baseline: 12/02/2022: Pt. is unable to actively elevate he left shoulder in order to blow dry her hair. 10/23/2022: Pt. continues to be unable to hold a blow dryer. Eval: Pt. Is unable to sustain her BUEs in elevation, and use a blow dryer, and brush. 10/09/2022: Pt. Is unable to hold a blow dryer. Eval: Pt. Is unable to sustain her BUEs in elevation, and use a blow dryer, and brush. Goal status: Ongoing   3.  Pt. Will improve left lateral pinch strength by 3# to be able to independently cut meat. Baseline: 12/02/2022: Left pinch: 7# Pt. has difficulty cutting meat. 10/23/2022: Pt. Continues to present with difficulty cutting meat. 10/09/2022: 1#  Eval: 2#. Pt. has difficulty stabilizing utensil, and food while cutting food. Goal status: Ongoing   4.  Pt. Will improve Left hand [facility] skills to be able to be able to independently, and efficiently manipulate small objects during ADL tasks. Baseline 12/02/2022: TBD  10/23/2022: Pt. Continues to present with difficulty manipulating small objects. 10/09/2022: Left: 1 min & 35 sec.: Eval: Left: 52 sec. Right: 27 sec. Goal status: Ongoing     6.: Pt. will improve with left grip strength to be  able to hold and pull up covers. Baseline: 12/02/2022: 17# 10/23/2022: Left: 16# 10/09/2022: Left: 10# Eval:  Left grip strength 11#. Pt. has difficulty holding a full drink  securely with the left hand.             Goal status: Ongoing   7. Pt. Will improve typing speed, and accuracy in preparation for efficiently typing simple email message. Baseline: 12/02/2022:  Pt. continues to present with limited speed, and accuracy with typing.10/23/2022: Pt. continues to present with limited speed, and accuracy with typing. 10/09/2022: Pt. presents with difficulty typing. Pt. presents with difficulty typing an email. Typing speed, and accuracy TBD             Goal status: INITIAL                                        CLINICAL IMPRESSION:  Pt. reports no pain today. Pt. continues to present with very limited left shoulder AROM. Pt. has an appointment with her PCP next week. Pt. Presents with difficulty performing trnslatory movements of the hand with the objects frequently dropping through her 3rd, and 4th digits. Pt.  continues to benefit from OT services to work towards improving Left UE functioning in order to maximize engagement in, and overall independence with ADLs, and IADL tasks.    PERFORMANCE DEFICITS in functional skills including ADLs, IADLs, coordination, dexterity, sensation, ROM, strength, FMC, decreased knowledge of use of DME, vision, and UE functional use, cognitive skills including attention, and psychosocial skills including coping strategies, environmental adaptation, habits, and routines and behaviors.    IMPAIRMENTS are limiting patient from ADLs, IADLs, and social participation.    COMORBIDITIES may have co-morbidities  that affects occupational performance. Patient will benefit from skilled OT to address above impairments and improve overall function.   MODIFICATION OR ASSISTANCE TO COMPLETE EVALUATION: Min-Moderate modification of tasks or assist with assess necessary to complete an  evaluation.   OT OCCUPATIONAL PROFILE AND HISTORY: Detailed assessment: Review of records and additional review of physical, cognitive, psychosocial history related to current functional performance.   CLINICAL DECISION MAKING: Moderate - several treatment options, min-mod task modification necessary   REHAB POTENTIAL: Good   EVALUATION COMPLEXITY: Moderate      PLAN: OT FREQUENCY: 2x/week   OT DURATION: 12 weeks   PLANNED INTERVENTIONS: self care/ADL training, therapeutic exercise, therapeutic activity, neuromuscular re-education, manual therapy, passive range of motion, and paraffin   RECOMMENDED OTHER SERVICES: ST, and PT   CONSULTED AND AGREED WITH PLAN OF CARE: Patient   PLAN FOR NEXT SESSION:  L hand strengthening and coordination exercises    Olegario Messier, MS, OTR/L  12/09/22, 1:17 PM

## 2022-12-11 ENCOUNTER — Ambulatory Visit: Payer: Medicare HMO | Admitting: Occupational Therapy

## 2022-12-11 ENCOUNTER — Ambulatory Visit: Payer: Medicare HMO | Admitting: Physical Therapy

## 2022-12-16 ENCOUNTER — Ambulatory Visit: Payer: Medicare HMO | Admitting: Physical Therapy

## 2022-12-16 ENCOUNTER — Ambulatory Visit: Payer: Medicare HMO | Admitting: Occupational Therapy

## 2022-12-16 ENCOUNTER — Ambulatory Visit: Payer: Medicare HMO

## 2022-12-18 ENCOUNTER — Ambulatory Visit: Payer: Medicare HMO | Admitting: Occupational Therapy

## 2022-12-18 ENCOUNTER — Ambulatory Visit: Payer: Medicare HMO | Admitting: Physical Therapy

## 2022-12-18 DIAGNOSIS — M6281 Muscle weakness (generalized): Secondary | ICD-10-CM

## 2022-12-18 DIAGNOSIS — R2681 Unsteadiness on feet: Secondary | ICD-10-CM

## 2022-12-18 DIAGNOSIS — R262 Difficulty in walking, not elsewhere classified: Secondary | ICD-10-CM

## 2022-12-18 DIAGNOSIS — M25552 Pain in left hip: Secondary | ICD-10-CM

## 2022-12-18 DIAGNOSIS — M542 Cervicalgia: Secondary | ICD-10-CM

## 2022-12-18 DIAGNOSIS — I63511 Cerebral infarction due to unspecified occlusion or stenosis of right middle cerebral artery: Secondary | ICD-10-CM

## 2022-12-18 NOTE — Therapy (Signed)
OUTPATIENT PHYSICAL THERAPY  TREATMENT     Patient Name: Alyssa Mayo MRN: [id number] DOB:1945-08-05, 77 y.o., female Today's Date: 12/04/2022   END OF SESSION:    PT End of Session - 12/18/22 1111     Visit Number 23    Number of Visits 36    Date for PT Re-Evaluation 01/22/23    Authorization Type [employer]    Progress Note Due on Visit 30    PT Start Time 1106    PT Stop Time 1145    PT Time Calculation (min) 39 min    Equipment Utilized During Treatment Gait belt    Activity Tolerance Patient tolerated treatment well;No increased pain    Behavior During Therapy [facility] for tasks assessed/performed                                      Past Medical History:  Diagnosis Date   Abnormal levels of other serum enzymes 07/17/2015   Arthritis     Constipation 07/11/2015   COVID-19 03/06/2021   Diabetes mellitus without complication (HCC)     Elevated liver enzymes     Fatty liver     Generalized abdominal pain 07/11/2015   Hyperlipidemia     Hypertension     Lifelong obesity     Macular degeneration     Morbid obesity due to excess calories (HCC) 07/11/2015   Other fatigue 07/11/2015   Sleep apnea     Spinal headache           Past Surgical History:  Procedure Laterality Date   ABDOMINAL HYSTERECTOMY       APPENDECTOMY       CATARACT EXTRACTION       CERVICAL LAMINECTOMY   07/21/1985   CESAREAN SECTION       COLONOSCOPY   07/21/2012    diverticulosis, [facility] Dr. [name]    COLONOSCOPY WITH PROPOFOL N/A 02/04/2019    Procedure: COLONOSCOPY WITH PROPOFOL;  Surgeon: [name], MD;  Location: [facility] ENDOSCOPY;  Service: Endoscopy;  Laterality: N/A;   COLONOSCOPY WITH PROPOFOL N/A 03/18/2021    Procedure: COLONOSCOPY WITH PROPOFOL;  Surgeon: [name], MD;  Location: [facility] ENDOSCOPY;  Service: Endoscopy;  Laterality: N/A;  DM   DIAGNOSTIC LAPAROSCOPY       ESOPHAGOGASTRODUODENOSCOPY (EGD) WITH PROPOFOL N/A 02/04/2019    Procedure:  ESOPHAGOGASTRODUODENOSCOPY (EGD) WITH PROPOFOL;  Surgeon: [name], MD;  Location: [facility] ENDOSCOPY;  Service: Endoscopy;  Laterality: N/A;   ESOPHAGOGASTRODUODENOSCOPY (EGD) WITH PROPOFOL N/A 03/18/2021    Procedure: ESOPHAGOGASTRODUODENOSCOPY (EGD) WITH PROPOFOL;  Surgeon: [name], MD;  Location: [facility] ENDOSCOPY;  Service: Endoscopy;  Laterality: N/A;   LOOP RECORDER INSERTION N/A 08/05/2021    Procedure: LOOP RECORDER INSERTION;  Surgeon: [name], MD;  Location: [facility] INVASIVE CV LAB;  Service: Cardiovascular;  Laterality: N/A;   SPINE SURGERY       TUBAL LIGATION            Patient Active Problem List    Diagnosis Date Noted   UTI (urinary tract infection) 09/01/2022   COVID-19 virus infection 09/01/2022   Chronic diastolic CHF (congestive heart failure) (HCC) 09/01/2022   Chronic kidney disease, stage 3a (HCC) 09/01/2022   Obesity (BMI 30-39.9) 09/01/2022   Paroxysmal atrial fibrillation (HCC) 08/08/2022   Hypercoagulable state due to paroxysmal atrial fibrillation (HCC) 08/08/2022   Cerebral edema (HCC) 08/07/2021  OSA (obstructive sleep apnea) 08/07/2021   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   CVA (cerebral vascular accident) (HCC) 08/01/2021   GERD (gastroesophageal reflux disease) 07/20/2019   Advanced care planning/counseling discussion 12/17/2016   Skin lesions, generalized 11/13/2016   Chronic right hip pain 06/17/2016   Vitamin D deficiency 09/26/2015   Diastasis recti 08/08/2015   Elevated serum GGT level 07/24/2015   Essential hypertension 07/17/2015   Fatty liver 07/17/2015   Elevated alkaline phosphatase level 07/17/2015   Elevated serum glutamic pyruvic transaminase (SGPT) level 07/17/2015   Abnormal finding on EKG 07/11/2015   Morbid obesity (HCC) 07/11/2015   Colon, diverticulosis 07/11/2015   Abdominal wall hernia 07/11/2015   DM (diabetes mellitus), type 2 with neurological complications (HCC)     Hyperlipidemia         PCP: [name], MD   REFERRING PROVIDER: [name], MD & Edilia Bo, [place]   REFERRING DIAG: [phone] (ICD-10-CM) - Cerebral infarction due to unspecified occlusion or stenosis of unspecified precerebral arteries, Cervicalgia with degenerative disc   THERAPY DIAG:  Muscle weakness (generalized)   Other lack of coordination   Difficulty in walking, not elsewhere classified   Cervical pain   Pain in left hip   Painful cervical ROM   Apraxia   Unsteadiness on feet   Rationale for Evaluation and Treatment: Rehabilitation   ONSET DATE: 09/03/22   SUBJECTIVE:                                                                                                                                                                                                          SUBJECTIVE STATEMENT:   Pt reports that she is doing well. Went to [place] over the weekend and was able to access bedroom on second level without additional assist from family. No falls or LOB reported. Pt states that she saw PCP on 12/17/2022. Reports that she will be reffered to orthopedic surgery for Evaluation of shoulderpain and limited ROM that has been present since fall in April.   Mild L hip and LBP 1/10 at start of PT treatment.    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 Pt now currently being seen for outpatient OT and ST services. Pt reports limitations in stamina persist. Other PMH is significant for HTN, HLD, macular degeneration, DM, obesity, sleep apnea, arthritis, chronic R hip pain, vitamin D  deficiency, diastasis recti, abdominal wall hernia, hx of abdominal hysterectomy, appendectomy, spine surgery (years ago).    PAIN:  Are you having pain? Yes: NPRS scale: 2/10 Pain location:  L hip/L knee  Pain description:  dull ache Aggravating factors: sitting Relieving factors: laying down   PRECAUTIONS: None   WEIGHT BEARING RESTRICTIONS: No   FALLS:  Has patient fallen in last 6 months? Yes. Number of falls 2   LIVING ENVIRONMENT: Lives with: lives with their son Lives in: House/apartment Stairs: Yes: Internal: 13 steps; doesn't go up to the second floor and External: 4 steps; can reach both Has following equipment at home: Single point cane and Walker - 2 wheeled   OCCUPATION: Retired   PLOF: Independent with basic ADLs   PATIENT GOALS: "Get stronger. And reduce fall risk. Reduce reliance on cane"   NEXT MD VISIT: end of May.    OBJECTIVE:    DIAGNOSTIC FINDINGS:      CT HEAD WITHOUT CONTRAST  CT CERVICAL SPINE WITHOUT CONTRAST IMPRESSION: 1. No acute intracranial pathology. 2. Expected interval evolution of encephalomalacia of the right frontal lobe, following intraparenchymal hemorrhage seen acutely on examination dated 08/03/2021. 3. No fracture or static subluxation of the cervical spine. 4. Generally mild disc space height loss and osteophytosis throughout the cervical spine, with partial ankylosis of C5-C6 and a large left eccentric posterior bridging osteophyte at this level. MRI may be used to further evaluate cervical disc and neural foraminal pathology if indicated by neurologically localizing signs and symptoms.   EXAM: xRay PELVIS - 1-2 VIEW  IMPRESSION: 1. No convincing evidence of acute fracture or diastasis of the pelvis. 2. Well corticated osseous focus beneath the right inferior pubic ramus may reflect sequela of avulsion injury or chronic tendinopathy, suggest correlation with direct tenderness to palpation.     PATIENT SURVEYS:  NDI 15/50 = 30% disability FOTO 65/68   10/23/2022 : 52/68  10/23/2022 Foto CVA 57/59   COGNITION: Overall cognitive status: History of cognitive impairments - at baseline   SENSATION: WFL   POSTURE: rounded shoulders and  forward head   PALPATION:  tender to touch on L ischial tuberosity.                CERVICAL ROM:    Active ROM A/PROM (deg) eval 3/21 4/4  Flexion 39 25 39  Extension 10 24 25   Right lateral flexion 7 15 15   Left lateral flexion 6 20 14   Right rotation 49 53 41  Left rotation 30 42 35   (Blank rows = not tested)   UPPER EXTREMITY ROM:   Active ROM Right eval Left eval Right 4/4 Left 4/4  Shoulder flexion 128 81 149 22  Shoulder abduction 160 68  158  38   (Blank rows = not tested)     UPPER EXTREMITY MMT:   Grossly decreased (3+/5) in the L UE due to pain and lack of ROM     CERVICAL SPECIAL TESTS:  Spurling's test: Negative and Sharp pursor's test: Negative           LOWER EXTREMITY MMT:     MMT Right 3/21 Left 3/21  Hip flexion 4+ 4-  Hip extension 4+ 4-  Hip abduction 4+ 4  Hip adduction 4+ 4  Hip internal rotation      Hip external rotation      Knee flexion 4+ 4-  Knee extension 4+ 4  Ankle dorsiflexion 4+ 4  Ankle plantarflexion  Ankle inversion      Ankle eversion       (Blank rows = not tested)   LOWER EXTREMITY ROM:      Passive  Right 3/21 Left 3/21  Hip flexion 120 105  Hip extension      Hip abduction      Hip adduction      Hip internal rotation      Hip external rotation      Knee flexion [facility] WFL  Knee extension (in Hip flexion 90deg) 143 120  Ankle dorsiflexion      Ankle plantarflexion      Ankle inversion      Ankle eversion       (Blank rows = not tested)      LOWER EXTREMITY SPECIAL TESTS:  Hip special tests: Luisa Hart (FABER) test: positive , Hip scouring test: negative, and distraction: negative     FUNCTIONAL TESTS:  6 minute walk test: 975ft with SPC 10 meter walk test: 0.57m/s Not performed at this time.   TODAY'S TREATMENT: DATE: 12/04/22  SUpine therex:  LTR x 12 bil  Bridge x 8 mild LBP noted by pt  SAQ 3# ankle weight  Hip abduction GTB x 10  SLR AROM AROM x 10  Hip flexion GTB x 12   Lumbar stabilization to resist trunk rotation in hooklying x 8 bil with 3 sec hold   Supine cervical rotation R and L AROM x 5 bil  AAROM with over pressure from PT. X 30 sec hold  Rotational mobilization at end range x 30 sec bil  Horizontal mobilization to upper cspine  x 30 sec bil   cervical rotation with STM to L UT/splenius capitus x 30 sec bil    Min assist to sit on EOB due to pt position close to EOB.   Seated therapy ball forward  roll x 10  Diagonal therapy ball roll x 8 bil  Mild L shoulder tightness reported by pt with pressure through LUE  UE ranger  shoulder flexion x 10  Horizontal abduction/adduction x 8 bil.  Mild discomfort in supraspinatus with shoulder flexion and pain at greater tuberosity in horizontal adduction      PATIENT EDUCATION:  Education details: Pt educated throughout session about proper posture and technique with exercises. Improved exercise technique, movement at target joints, use of target muscles after min to mod verbal, visual, tactile cues.   Person educated: Patient and   Education method: Explanation Education comprehension: verbalized understanding   HOME EXERCISE PROGRAM:   Access Code: [email or web address] URL: [email or web address] Date: 09/18/2022 Prepared by: Tomasa Hose   Exercises - Seated Upper Trapezius Stretch  - 1 x daily - 7 x weekly - 3 sets - 10 reps - Seated Levator Scapulae Stretch  - 1 x daily - 7 x weekly - 3 sets - 10 reps - Seated Scapular Retraction  - 1 x daily - 7 x weekly - 3 sets - 10 reps   ASSESSMENT:   CLINICAL IMPRESSION: Patient presents with good motivation for completion of physical therapy activities.  PT treatment focused on improved strength and range of motion and bilateral lower extremities and lower back as well as improved muscle extensibility and range of motion in the cervical spine.  Patient noted to have improved range of motion in the cervical spine and decreased pain in low back and  left hip on this day allowing increased load and repetitions compared to prior PT treatment.  Patient continues to have reduced  range of motion in the left shoulder actively and passively with increased pain and weightbearing through the left upper extremity.  Pt will continue to benefit from skilled physical therapy intervention to address impairments, improve QOL, and attain therapy goals.            OBJECTIVE IMPAIRMENTS: decreased activity tolerance, decreased cognition, decreased ROM, decreased strength, decreased safety awareness, hypomobility, and pain.    ACTIVITY LIMITATIONS: carrying, lifting, bending, sitting, standing, squatting, and reach over head   PARTICIPATION LIMITATIONS: meal prep, cleaning, laundry, driving, shopping, community activity, and yard work   PERSONAL FACTORS: Age, Behavior pattern, Past/current experiences, Time since onset of injury/illness/exacerbation, and 3+ comorbidities: arthritis, DM2, HTN, Obesity,   are also affecting patient's functional outcome.    REHAB POTENTIAL: Good   CLINICAL DECISION MAKING: Stable/uncomplicated   EVALUATION COMPLEXITY: Low     GOALS: Goals reviewed with patient? Yes   SHORT TERM GOALS: Target date: 12/11/2022       Pt will be independent with HEP in order to improve strength of BLE and ROM of the neck in order to return to PLOF. Baseline: provided on neck visit Goal status: INITIAL       LONG TERM GOALS: Target date: 01/22/2023       Patient will demonstrate improved function as evidenced by a score of 68 on FOTO measure for full participation in activities at home and in the community. Baseline: 65. 10/23/22: 52. 5/15 56 for neck and CVA Goal status: IN PROGRESS   2.  Pt to improve NDI by 5 points or 10% in order to demonstrate a reduction in overall disability caused by neck pain at this time. Baseline: 15/50 or 30% disability 4/11 27/50  Goal status: NOT MET   3.  Pt to demonstrate improved cervical  rotation to 60 deg bilaterally in order to perform tasks around the house that require turning of the head. Baseline: R/L Cervical Rotation: 49/30. 4/424 R:53. L:42  5/14: R45deg. L50deg  Goal status: IN PROGRESS   4.  Pt to report 0/10 pain in the neck following light household chores at home in order to demonstrate an overall reduction in pain and an improvement in QoL. Baseline: 4/10 at rest 0/10 in neck Goal status: MET   4.  Pt will improve gait speed to <1.14m/s to indicate improved community mobility and reduced fall risk  Baseline: 0.57m/s  Goal status: revised    5. Pt will increase 6 min walk test >1079ft  Baseline: 979ft with SPC Goal status: in progress.    PLAN:   PT FREQUENCY: 1-2x/week   PT DURATION: 12 weeks   PLANNED INTERVENTIONS: Therapeutic exercises, Therapeutic activity, Neuromuscular re-education, Balance training, Gait training, Patient/Family education, Self Care, Joint mobilization, Joint manipulation, Stair training, Vestibular training, Canalith repositioning, Visual/preceptual remediation/compensation, DME instructions, Dry Needling, Cognitive remediation, Electrical stimulation, Spinal manipulation, Spinal mobilization, Cryotherapy, Moist heat, Traction, Ultrasound, Manual therapy, and Re-evaluation       PLAN FOR NEXT SESSION:      Increased strengthening and dynamic balance training.  Re-assess HEP   Manual therapy as needed for pain management on LLE.      Grier Rocher PT, DPT  Physical Therapist - [facility]  12:17 PM 12/04/22

## 2022-12-18 NOTE — Therapy (Signed)
Occupational Therapy Neuro Treatment Note   Patient Name: Alyssa Mayo MRN: [id number] DOB:1946/05/01, 77 y.o., female Today's Date: 03/14/2022   PCP: [name], MD REFERRING PROVIDER: [name], NP     OT End of Session - 12/18/22 1242     Visit Number 23    Number of Visits 48    Date for OT Re-Evaluation 02/24/23    Authorization Type Progress reporting period starting 09/16/2022    OT Start Time 1145    OT Stop Time 1230    OT Time Calculation (min) 45 min    Activity Tolerance Patient tolerated treatment well    Behavior During Therapy [facility] for tasks assessed/performed                                    Past Medical History:  Diagnosis Date   Abnormal levels of other serum enzymes 07/17/2015   Arthritis     Constipation 07/11/2015   COVID-19 03/06/2021   Diabetes mellitus without complication (HCC)     Elevated liver enzymes     Fatty liver     Generalized abdominal pain 07/11/2015   Hyperlipidemia     Hypertension     Lifelong obesity     Macular degeneration     Morbid obesity due to excess calories (HCC) 07/11/2015   Other fatigue 07/11/2015   Sleep apnea     Spinal headache           Past Surgical History:  Procedure Laterality Date   ABDOMINAL HYSTERECTOMY       APPENDECTOMY       CATARACT EXTRACTION       CERVICAL LAMINECTOMY   07/21/1985   CESAREAN SECTION       COLONOSCOPY   07/21/2012    diverticulosis, [facility] Dr. [name]    COLONOSCOPY WITH PROPOFOL N/A 02/04/2019    Procedure: COLONOSCOPY WITH PROPOFOL;  Surgeon: [name], MD;  Location: [facility] ENDOSCOPY;  Service: Endoscopy;  Laterality: N/A;   COLONOSCOPY WITH PROPOFOL N/A 03/18/2021    Procedure: COLONOSCOPY WITH PROPOFOL;  Surgeon: [name], MD;  Location: [facility] ENDOSCOPY;  Service: Endoscopy;  Laterality: N/A;  DM   DIAGNOSTIC LAPAROSCOPY       ESOPHAGOGASTRODUODENOSCOPY (EGD) WITH PROPOFOL N/A 02/04/2019    Procedure: ESOPHAGOGASTRODUODENOSCOPY (EGD)  WITH PROPOFOL;  Surgeon: [name], MD;  Location: [facility] ENDOSCOPY;  Service: Endoscopy;  Laterality: N/A;   ESOPHAGOGASTRODUODENOSCOPY (EGD) WITH PROPOFOL N/A 03/18/2021    Procedure: ESOPHAGOGASTRODUODENOSCOPY (EGD) WITH PROPOFOL;  Surgeon: [name], MD;  Location: [facility] ENDOSCOPY;  Service: Endoscopy;  Laterality: N/A;   LOOP RECORDER INSERTION N/A 08/05/2021    Procedure: LOOP RECORDER INSERTION;  Surgeon: [name], MD;  Location: [facility] INVASIVE CV LAB;  Service: Cardiovascular;  Laterality: N/A;   SPINE SURGERY       TUBAL LIGATION            Patient Active Problem List    Diagnosis Date Noted   Paroxysmal atrial fibrillation (HCC) 08/08/2022   Hypercoagulable state due to paroxysmal atrial fibrillation (HCC) 08/08/2022   Cerebral edema (HCC) 08/07/2021   OSA (obstructive sleep apnea) 08/07/2021   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   CVA (cerebral vascular accident) (HCC) 08/01/2021   GERD (gastroesophageal reflux disease) 07/20/2019   Advanced care planning/counseling discussion 12/17/2016   Skin lesions, generalized 11/13/2016   Chronic right hip pain 06/17/2016  Vitamin D deficiency 09/26/2015   Diastasis recti 08/08/2015   Elevated serum GGT level 07/24/2015   Essential hypertension 07/17/2015   Fatty liver 07/17/2015   Elevated alkaline phosphatase level 07/17/2015   Elevated serum glutamic pyruvic transaminase (SGPT) level 07/17/2015   Abnormal finding on EKG 07/11/2015   Morbid obesity (HCC) 07/11/2015   Colon, diverticulosis 07/11/2015   Abdominal wall hernia 07/11/2015   DM (diabetes mellitus), type 2 with neurological complications (HCC)     Hyperlipidemia        ONSET DATE: 08/01/2021   REFERRING DIAG: CVA   THERAPY DIAG:  Muscle weakness (generalized)   Rationale for Evaluation and Treatment Rehabilitation   SUBJECTIVE:    SUBJECTIVE STATEMENT:    Pt. reports having had a nice time visiting her son, and his family  in [place].   Pt accompanied by: self   PERTINENT HISTORY:  Pt. is a 77 y.o. female who had a unexpectedly hospitalized from 2/12-2/14/2024 for COVID-19, and a UTI.  Pt. 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 Pt. attended inpatient rehab from 08/07/2021-08/22/2021. Pt. received Home health therapy services this past spring.  Pt. has recently had an assessment through driver rehabilitation services at [facility] with recommendations for referrals for  outpatient OT/PT, and ST services. Pt. PMHx includes: HTN, Hyperlipidemia, Macular degeneration, DM, and Obesity.   PRECAUTIONS: None   WEIGHT BEARING RESTRICTIONS No   PAIN:  Are you having pain?  1/10 pain   FALLS: Has patient fallen in last 6 months? Yes.   LIVING ENVIRONMENT: Lives with: lives with their family  Son  [name] Lives in: House/apartment Main living are on one floor Stairs:  2 steps to enter Has following equipment at home: Single point cane, Wheelchair (manual), shower chair, Grab bars, bed rail, and rubber mat.   PLOF: Independent   PATIENT GOALS: To be able to Drive, and cook again   OBJECTIVE:    HAND DOMINANCE: Right    ADLs:  Transfers/ambulation related to ADLs: Independent Eating: Independent Grooming: [employer] Pt. reports being unable to use her left hand to perform haircare.  UB Dressing: Independent pullover shirt, independent now with fastening a bra LB Dressing: Independent donning pants socks, and slide on shoes Toileting: Independent Bathing: Independent Tub Shower transfers: Walk-in shower Independent now     IADLs: Shopping: Needs to be accompanied to the grocery store. Light housekeeping: Do  Meal Prep:  Is able to perform light meal prep Community mobility: Relies on son, and friend Medication management: Son sets up Mining engineer management:TBD Handwriting: TBD   MOBILITY STATUS: Hx of falls out of bed     ACTIVITY  TOLERANCE: Activity tolerance:  10-20 min. Before rest break   FUNCTIONAL OUTCOME MEASURES: FOTO: 61  TR score: 62  10/09/2022: FOTO: 49   UPPER EXTREMITY ROM      Active ROM Right eval Left eval Left  10/09/2022 Left 10/23/22 Left 12/02/2022  Shoulder flexion WFL 54(74) scaption 0(55) 26(80) 32(82)  Shoulder abduction WFL 58(75) 32(54) 32(50) 45(60)  Shoulder adduction         Shoulder extension         Shoulder internal rotation         Shoulder external rotation         Elbow flexion [facility] WFL 145 145 145  Elbow extension [facility] WFL 10(0) 10(0) WFL  Wrist flexion WFL TBD [facility] WFL  Wrist extension WFL TBD [facility] WFL  Wrist ulnar deviation  Wrist radial deviation         Wrist pronation         Wrist supination         (Blank rows = not tested)     UPPER EXTREMITY MMT:      MMT Right eval Left eval Left 10/09/2022 Left 10/23/2022 Left 12/02/2022  Shoulder flexion 4+/5 2+/5 1/5 2-/5 2-/5  Shoulder abduction 4+/5 2+/5 2-/5 2-/5 2-/5  Shoulder adduction         Shoulder extension         Shoulder internal rotation         Shoulder external rotation         Middle trapezius         Lower trapezius         Elbow flexion 4+/5 3+/5 3/5 3/5 3+/5  Elbow extension 4+/5 3+/5 3/5 3/5 3+/5  Wrist flexion 4+/5 TBD 3/5 3/5 3+/5  Wrist extension 4+/5 TBD 3/5 3/5 3+/5  Wrist ulnar deviation         Wrist radial deviation         Wrist pronation         Wrist supination         (Blank rows = not tested)   HAND FUNCTION: Grip strength: Right: 33 lbs; Left: 11 lbs, Lateral pinch: Right: 17 lbs, Left: 2 lbs, and 3 point pinch: Right: 17 lbs, Left: 2 lbs  10/09/2022: Grip strength: Right: 33 lbs; Left: 10 lbs, Lateral pinch: Right: 17 lbs, Left: 1 lbs, and 3 point pinch: Right: 17 lbs, Left: 1 lbs   10/23/2022: Grip strength: Right: 33 lbs; Left: 16 lbs, Lateral pinch: Right: 17 lbs, Left: 1 lbs, and 3 point pinch: Right: 17 lbs, Left: 1 lbs  12/02/2022: Grip strength: Right:  33 lbs; Left: 17 lbs, Lateral pinch: Right: 17 lbs, Left: 7 lbs, and 3 point pinch: Right: 17 lbs, Left: 3 lbs     COORDINATION:   9 Hole Peg test: Right: 27 sec.  sec; Left: 52 sec.  10/09/2022: 9 Hole Peg test: Right: 27 sec.  sec; Left: 1 min. & 35 sec.  SENSATION: Light touch: WFL Proprioception: WFL   COGNITION: Overall cognitive status: Within functional limits for tasks assessed   VISION: Subjective report: Glasses. Just received a new prescription to increase bifocal strength Baseline vision: Macular Degeneration Visual history: macular degeneration   VISION ASSESSMENT: To be further assessed in functional context  Left sided Awareness      PERCEPTION: Limited left sided awareness     TODAY'S TREATMENT:   Therapeutic Exercise:               Pt. tolerated PROM in all joint ranges of the left shoulder. Pt. worked on [employer], and reciprocal motion using the UBE while seated for 8 min. with no resistance initially, and the resistance gradually increasing. Constant monitoring was provided. AAROM using an incline wedge at the tabletop.     Neuromuscular re-education:    Pt. worked on pinch strengthening in the left hand for lateral, and 3pt. pinch using yellow, red, and green resistive clips. Pt. worked on placing the clips on a horizontal dowel.  Pt. worked on Comptroller moving the the clips through her hand from her palm to the tipe of the 2nd dogot and thumb in preparation for placing it on the horizontal dowel. Tactile and verbal cues were required for eliciting the desired movement.    PATIENT EDUCATION: Education details:  Compensatory strategies during typing. Person educated: Patient Education method: verbal cues Education comprehension: verbalized understanding and pt does not plan to cook unless son is present.     HOME EXERCISE PROGRAM:  Reviewed home activities to enhance left hand digit extension  during typing skills.   GOALS: Goals  reviewed with patient? Yes     SHORT TERM GOALS: Target date: 01/13/2023       Pt. Will improve FOTO score by 2 points to reflect improved pt. perceived functional performance Baseline: 12/01/2022: FOTO: 51 10/23/2022: FOTO 49 10/09/2022: FOTO 49, FOTO: 61 TR score: 62  Goal status: INITIAL   LONG TERM GOALS: Target date:02/24/2023  Pt. Will improve left shoulder ROM by 10 degrees to be able able to efficiently apply deodorant Baseline: 12/02/2022: Shoulder flexion: 32(82), Abduction: 45(60)  10/23/2022: Shoulder flexion: 26(80), Abduction: 32(50) 10/09/2022: flexion: 0(55), Abduction: 32(54) Goal status: Improving, but very limited.   2.  Pt. will be able to independently hold and use a blow dryer, and brush for hair care. Baseline: 12/02/2022: Pt. is unable to actively elevate he left shoulder in order to blow dry her hair. 10/23/2022: Pt. continues to be unable to hold a blow dryer. Eval: Pt. Is unable to sustain her BUEs in elevation, and use a blow dryer, and brush. 10/09/2022: Pt. Is unable to hold a blow dryer. Eval: Pt. Is unable to sustain her BUEs in elevation, and use a blow dryer, and brush. Goal status: Ongoing   3.  Pt. Will improve left lateral pinch strength by 3# to be able to independently cut meat. Baseline: 12/02/2022: Left pinch: 7# Pt. has difficulty cutting meat. 10/23/2022: Pt. Continues to present with difficulty cutting meat. 10/09/2022: 1#  Eval: 2#. Pt. has difficulty stabilizing utensil, and food while cutting food. Goal status: Ongoing   4.  Pt. Will improve Left hand [facility] skills to be able to be able to independently, and efficiently manipulate small objects during ADL tasks. Baseline 12/02/2022: TBD  10/23/2022: Pt. Continues to present with difficulty manipulating small objects. 10/09/2022: Left: 1 min & 35 sec.: Eval: Left: 52 sec. Right: 27 sec. Goal status: Ongoing     6.: Pt. will improve with left grip strength to be able to hold and pull up covers. Baseline:  12/02/2022: 17# 10/23/2022: Left: 16# 10/09/2022: Left: 10# Eval:  Left grip strength 11#. Pt. has difficulty holding a full drink  securely with the left hand.             Goal status: Ongoing   7. Pt. Will improve typing speed, and accuracy in preparation for efficiently typing simple email message. Baseline: 12/02/2022:  Pt. continues to present with limited speed, and accuracy with typing.10/23/2022: Pt. continues to present with limited speed, and accuracy with typing. 10/09/2022: Pt. presents with difficulty typing. Pt. presents with difficulty typing an email. Typing speed, and accuracy TBD             Goal status: INITIAL                                        CLINICAL IMPRESSION:  Pt. reports that she she has a referral to see an orthopedic PA about her shoulder. Pt. presents with difficulty motor planning through the movements needed for performing translatory movements of the hand. Pt. continues to benefit from OT services to work towards improving Left UE functioning in order to  maximize engagement in, and overall independence with ADLs, and IADL tasks.    PERFORMANCE DEFICITS in functional skills including ADLs, IADLs, coordination, dexterity, sensation, ROM, strength, FMC, decreased knowledge of use of DME, vision, and UE functional use, cognitive skills including attention, and psychosocial skills including coping strategies, environmental adaptation, habits, and routines and behaviors.    IMPAIRMENTS are limiting patient from ADLs, IADLs, and social participation.    COMORBIDITIES may have co-morbidities  that affects occupational performance. Patient will benefit from skilled OT to address above impairments and improve overall function.   MODIFICATION OR ASSISTANCE TO COMPLETE EVALUATION: Min-Moderate modification of tasks or assist with assess necessary to complete an evaluation.   OT OCCUPATIONAL PROFILE AND HISTORY: Detailed assessment: Review of records and additional review of  physical, cognitive, psychosocial history related to current functional performance.   CLINICAL DECISION MAKING: Moderate - several treatment options, min-mod task modification necessary   REHAB POTENTIAL: Good   EVALUATION COMPLEXITY: Moderate      PLAN: OT FREQUENCY: 2x/week   OT DURATION: 12 weeks   PLANNED INTERVENTIONS: self care/ADL training, therapeutic exercise, therapeutic activity, neuromuscular re-education, manual therapy, passive range of motion, and paraffin   RECOMMENDED OTHER SERVICES: ST, and PT   CONSULTED AND AGREED WITH PLAN OF CARE: Patient   PLAN FOR NEXT SESSION:  L hand strengthening and coordination exercises    Olegario Messier, MS, OTR/L  12/18/22, 12:45 PM

## 2022-12-22 NOTE — Progress Notes (Signed)
Carelink Summary Report / Loop Recorder 

## 2022-12-23 ENCOUNTER — Other Ambulatory Visit: Payer: Self-pay

## 2022-12-23 ENCOUNTER — Ambulatory Visit: Payer: Medicare HMO | Admitting: Physical Therapy

## 2022-12-23 ENCOUNTER — Ambulatory Visit: Payer: Medicare HMO | Attending: Adult Health | Admitting: Occupational Therapy

## 2022-12-23 DIAGNOSIS — M542 Cervicalgia: Secondary | ICD-10-CM | POA: Diagnosis present

## 2022-12-23 DIAGNOSIS — M6281 Muscle weakness (generalized): Secondary | ICD-10-CM | POA: Insufficient documentation

## 2022-12-23 DIAGNOSIS — M25552 Pain in left hip: Secondary | ICD-10-CM | POA: Diagnosis present

## 2022-12-23 DIAGNOSIS — R482 Apraxia: Secondary | ICD-10-CM | POA: Diagnosis present

## 2022-12-23 DIAGNOSIS — I63511 Cerebral infarction due to unspecified occlusion or stenosis of right middle cerebral artery: Secondary | ICD-10-CM | POA: Insufficient documentation

## 2022-12-23 DIAGNOSIS — R278 Other lack of coordination: Secondary | ICD-10-CM | POA: Insufficient documentation

## 2022-12-23 DIAGNOSIS — R2681 Unsteadiness on feet: Secondary | ICD-10-CM | POA: Insufficient documentation

## 2022-12-23 DIAGNOSIS — R262 Difficulty in walking, not elsewhere classified: Secondary | ICD-10-CM

## 2022-12-23 NOTE — Therapy (Addendum)
Occupational Therapy Neuro Treatment Note   Patient Name: Alyssa Mayo MRN: [id number] DOB:Nov 26, 1945, 77 y.o., female Today's Date: 03/14/2022   PCP: [name], MD REFERRING PROVIDER: [name], NP     OT End of Session - 12/23/22 1049     Visit Number 24    Number of Visits 48    Date for OT Re-Evaluation 02/24/23    Authorization Type Progress reporting period starting 09/16/2022    OT Start Time 1030    OT Stop Time 1100    OT Time Calculation (min) 30 min    Activity Tolerance Patient tolerated treatment well    Behavior During Therapy [facility] for tasks assessed/performed                                    Past Medical History:  Diagnosis Date   Abnormal levels of other serum enzymes 07/17/2015   Arthritis     Constipation 07/11/2015   COVID-19 03/06/2021   Diabetes mellitus without complication (HCC)     Elevated liver enzymes     Fatty liver     Generalized abdominal pain 07/11/2015   Hyperlipidemia     Hypertension     Lifelong obesity     Macular degeneration     Morbid obesity due to excess calories (HCC) 07/11/2015   Other fatigue 07/11/2015   Sleep apnea     Spinal headache           Past Surgical History:  Procedure Laterality Date   ABDOMINAL HYSTERECTOMY       APPENDECTOMY       CATARACT EXTRACTION       CERVICAL LAMINECTOMY   07/21/1985   CESAREAN SECTION       COLONOSCOPY   07/21/2012    diverticulosis, [facility] Dr. [name]    COLONOSCOPY WITH PROPOFOL N/A 02/04/2019    Procedure: COLONOSCOPY WITH PROPOFOL;  Surgeon: [name], MD;  Location: [facility] ENDOSCOPY;  Service: Endoscopy;  Laterality: N/A;   COLONOSCOPY WITH PROPOFOL N/A 03/18/2021    Procedure: COLONOSCOPY WITH PROPOFOL;  Surgeon: [name], MD;  Location: [facility] ENDOSCOPY;  Service: Endoscopy;  Laterality: N/A;  DM   DIAGNOSTIC LAPAROSCOPY       ESOPHAGOGASTRODUODENOSCOPY (EGD) WITH PROPOFOL N/A 02/04/2019    Procedure: ESOPHAGOGASTRODUODENOSCOPY (EGD)  WITH PROPOFOL;  Surgeon: [name], MD;  Location: [facility] ENDOSCOPY;  Service: Endoscopy;  Laterality: N/A;   ESOPHAGOGASTRODUODENOSCOPY (EGD) WITH PROPOFOL N/A 03/18/2021    Procedure: ESOPHAGOGASTRODUODENOSCOPY (EGD) WITH PROPOFOL;  Surgeon: [name], MD;  Location: [facility] ENDOSCOPY;  Service: Endoscopy;  Laterality: N/A;   LOOP RECORDER INSERTION N/A 08/05/2021    Procedure: LOOP RECORDER INSERTION;  Surgeon: [name], MD;  Location: [facility] INVASIVE CV LAB;  Service: Cardiovascular;  Laterality: N/A;   SPINE SURGERY       TUBAL LIGATION            Patient Active Problem List    Diagnosis Date Noted   Paroxysmal atrial fibrillation (HCC) 08/08/2022   Hypercoagulable state due to paroxysmal atrial fibrillation (HCC) 08/08/2022   Cerebral edema (HCC) 08/07/2021   OSA (obstructive sleep apnea) 08/07/2021   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   CVA (cerebral vascular accident) (HCC) 08/01/2021   GERD (gastroesophageal reflux disease) 07/20/2019   Advanced care planning/counseling discussion 12/17/2016   Skin lesions, generalized 11/13/2016   Chronic right hip pain 06/17/2016  Vitamin D deficiency 09/26/2015   Diastasis recti 08/08/2015   Elevated serum GGT level 07/24/2015   Essential hypertension 07/17/2015   Fatty liver 07/17/2015   Elevated alkaline phosphatase level 07/17/2015   Elevated serum glutamic pyruvic transaminase (SGPT) level 07/17/2015   Abnormal finding on EKG 07/11/2015   Morbid obesity (HCC) 07/11/2015   Colon, diverticulosis 07/11/2015   Abdominal wall hernia 07/11/2015   DM (diabetes mellitus), type 2 with neurological complications (HCC)     Hyperlipidemia        ONSET DATE: 08/01/2021   REFERRING DIAG: CVA   THERAPY DIAG:  Muscle weakness (generalized)   Rationale for Evaluation and Treatment Rehabilitation   SUBJECTIVE:    SUBJECTIVE STATEMENT:    Pt. Was late for the session today.  Pt accompanied by: self    PERTINENT HISTORY:  Pt. is a 77 y.o. female who had a unexpectedly hospitalized from 2/12-2/14/2024 for COVID-19, and a UTI.  Pt. 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 Pt. attended inpatient rehab from 08/07/2021-08/22/2021. Pt. received Home health therapy services this past spring.  Pt. has recently had an assessment through driver rehabilitation services at [name] with recommendations for referrals for  outpatient OT/PT, and ST services. Pt. PMHx includes: HTN, Hyperlipidemia, Macular degeneration, DM, and Obesity.   PRECAUTIONS: None   WEIGHT BEARING RESTRICTIONS No   PAIN:  Are you having pain?  1/10 pain   FALLS: Has patient fallen in last 6 months? Yes.   LIVING ENVIRONMENT: Lives with: lives with their family  Son  [name] Lives in: House/apartment Main living are on one floor Stairs:  2 steps to enter Has following equipment at home: Single point cane, Wheelchair (manual), shower chair, Grab bars, bed rail, and rubber mat.   PLOF: Independent   PATIENT GOALS: To be able to Drive, and cook again   OBJECTIVE:    HAND DOMINANCE: Right    ADLs:  Transfers/ambulation related to ADLs: Independent Eating: Independent Grooming: [employer] Pt. reports being unable to use her left hand to perform haircare.  UB Dressing: Independent pullover shirt, independent now with fastening a bra LB Dressing: Independent donning pants socks, and slide on shoes Toileting: Independent Bathing: Independent Tub Shower transfers: Walk-in shower Independent now     IADLs: Shopping: Needs to be accompanied to the grocery store. Light housekeeping: Do  Meal Prep:  Is able to perform light meal prep Community mobility: Relies on son, and friend Medication management: Son sets up Mining engineer management:TBD Handwriting: TBD   MOBILITY STATUS: Hx of falls out of bed     ACTIVITY TOLERANCE: Activity tolerance:  10-20 min. Before rest  break   FUNCTIONAL OUTCOME MEASURES: FOTO: 61  TR score: 62  10/09/2022: FOTO: 49   UPPER EXTREMITY ROM      Active ROM Right eval Left eval Left  10/09/2022 Left 10/23/22 Left 12/02/2022  Shoulder flexion WFL 54(74) scaption 0(55) 26(80) 32(82)  Shoulder abduction WFL 58(75) 32(54) 32(50) 45(60)  Shoulder adduction         Shoulder extension         Shoulder internal rotation         Shoulder external rotation         Elbow flexion [facility] WFL 145 145 145  Elbow extension [facility] WFL 10(0) 10(0) WFL  Wrist flexion WFL TBD [facility] WFL  Wrist extension WFL TBD [facility] WFL  Wrist ulnar deviation         Wrist  radial deviation         Wrist pronation         Wrist supination         (Blank rows = not tested)     UPPER EXTREMITY MMT:      MMT Right eval Left eval Left 10/09/2022 Left 10/23/2022 Left 12/02/2022  Shoulder flexion 4+/5 2+/5 1/5 2-/5 2-/5  Shoulder abduction 4+/5 2+/5 2-/5 2-/5 2-/5  Shoulder adduction         Shoulder extension         Shoulder internal rotation         Shoulder external rotation         Middle trapezius         Lower trapezius         Elbow flexion 4+/5 3+/5 3/5 3/5 3+/5  Elbow extension 4+/5 3+/5 3/5 3/5 3+/5  Wrist flexion 4+/5 TBD 3/5 3/5 3+/5  Wrist extension 4+/5 TBD 3/5 3/5 3+/5  Wrist ulnar deviation         Wrist radial deviation         Wrist pronation         Wrist supination         (Blank rows = not tested)   HAND FUNCTION: Grip strength: Right: 33 lbs; Left: 11 lbs, Lateral pinch: Right: 17 lbs, Left: 2 lbs, and 3 point pinch: Right: 17 lbs, Left: 2 lbs  10/09/2022: Grip strength: Right: 33 lbs; Left: 10 lbs, Lateral pinch: Right: 17 lbs, Left: 1 lbs, and 3 point pinch: Right: 17 lbs, Left: 1 lbs   10/23/2022: Grip strength: Right: 33 lbs; Left: 16 lbs, Lateral pinch: Right: 17 lbs, Left: 1 lbs, and 3 point pinch: Right: 17 lbs, Left: 1 lbs  12/02/2022: Grip strength: Right: 33 lbs; Left: 17 lbs, Lateral pinch: Right: 17 lbs,  Left: 7 lbs, and 3 point pinch: Right: 17 lbs, Left: 3 lbs     COORDINATION:   9 Hole Peg test: Right: 27 sec.  sec; Left: 52 sec.  10/09/2022: 9 Hole Peg test: Right: 27 sec.  sec; Left: 1 min. & 35 sec.  SENSATION: Light touch: WFL Proprioception: WFL   COGNITION: Overall cognitive status: Within functional limits for tasks assessed   VISION: Subjective report: Glasses. Just received a new prescription to increase bifocal strength Baseline vision: Macular Degeneration Visual history: macular degeneration   VISION ASSESSMENT: To be further assessed in functional context  Left sided Awareness      PERCEPTION: Limited left sided awareness     TODAY'S TREATMENT:   Therapeutic Exercise:               Pt. tolerated PROM in all joint ranges of the left shoulder. Pt. worked on [employer], and reciprocal motion using the UBE while seated for 8 min. with no resistance initially, and the resistance gradually increasing.    Neuromuscular re-education:    Pt. worked on tasks to sustain left lateral pinch on resistive tweezers while grasping and moving 2" toothpick sticks from a horizontal flat position to a vertical position in order to place it in the holder. Pt. was able to sustain grasp while positioning and extending the wrist/hand in the necessary alignment needed to place the stick through the top of the holder.    PATIENT EDUCATION: Education details: Compensatory strategies during typing. Person educated: Patient Education method: verbal cues Education comprehension: verbalized understanding and pt does not plan to cook unless son is present.  HOME EXERCISE PROGRAM:  Reviewed home activities to enhance left hand digit extension  during typing skills.   GOALS: Goals reviewed with patient? Yes     SHORT TERM GOALS: Target date: 01/13/2023       Pt. Will improve FOTO score by 2 points to reflect improved pt. perceived functional performance Baseline:  12/01/2022: FOTO: 51 10/23/2022: FOTO 49 10/09/2022: FOTO 49, FOTO: 61 TR score: 62  Goal status: INITIAL   LONG TERM GOALS: Target date:02/24/2023  Pt. Will improve left shoulder ROM by 10 degrees to be able able to efficiently apply deodorant Baseline: 12/02/2022: Shoulder flexion: 32(82), Abduction: 45(60)  10/23/2022: Shoulder flexion: 26(80), Abduction: 32(50) 10/09/2022: flexion: 0(55), Abduction: 32(54) Goal status: Improving, but very limited.   2.  Pt. will be able to independently hold and use a blow dryer, and brush for hair care. Baseline: 12/02/2022: Pt. is unable to actively elevate he left shoulder in order to blow dry her hair. 10/23/2022: Pt. continues to be unable to hold a blow dryer. Eval: Pt. Is unable to sustain her BUEs in elevation, and use a blow dryer, and brush. 10/09/2022: Pt. Is unable to hold a blow dryer. Eval: Pt. Is unable to sustain her BUEs in elevation, and use a blow dryer, and brush. Goal status: Ongoing   3.  Pt. Will improve left lateral pinch strength by 3# to be able to independently cut meat. Baseline: 12/02/2022: Left pinch: 7# Pt. has difficulty cutting meat. 10/23/2022: Pt. Continues to present with difficulty cutting meat. 10/09/2022: 1#  Eval: 2#. Pt. has difficulty stabilizing utensil, and food while cutting food. Goal status: Ongoing   4.  Pt. Will improve Left hand [facility] skills to be able to be able to independently, and efficiently manipulate small objects during ADL tasks. Baseline 12/02/2022: TBD  10/23/2022: Pt. Continues to present with difficulty manipulating small objects. 10/09/2022: Left: 1 min & 35 sec.: Eval: Left: 52 sec. Right: 27 sec. Goal status: Ongoing     6.: Pt. will improve with left grip strength to be able to hold and pull up covers. Baseline: 12/02/2022: 17# 10/23/2022: Left: 16# 10/09/2022: Left: 10# Eval:  Left grip strength 11#. Pt. has difficulty holding a full drink  securely with the left hand.             Goal status: Ongoing   7.  Pt. Will improve typing speed, and accuracy in preparation for efficiently typing simple email message. Baseline: 12/02/2022:  Pt. continues to present with limited speed, and accuracy with typing.10/23/2022: Pt. continues to present with limited speed, and accuracy with typing. 10/09/2022: Pt. presents with difficulty typing. Pt. presents with difficulty typing an email. Typing speed, and accuracy TBD             Goal status: INITIAL                                        CLINICAL IMPRESSION:  Pt. reports that she needs to be a few minutes late for her therapy appointments on Thursday due to having a meeting with her lawyer. Pt. reports that she has a referral to see an orthopedic PA about her shoulder this month. Pt. required cues for left hand pinch position on the tweezers when extending her wrist prior to placing the sticks vertically into the container. Pt. presents with difficulty motor planning through the movements needed for performing translatory movements  of the hand. Pt. continues to benefit from OT services to work towards improving Left UE functioning in order to maximize engagement in, and overall independence with ADLs, and IADL tasks.    PERFORMANCE DEFICITS in functional skills including ADLs, IADLs, coordination, dexterity, sensation, ROM, strength, FMC, decreased knowledge of use of DME, vision, and UE functional use, cognitive skills including attention, and psychosocial skills including coping strategies, environmental adaptation, habits, and routines and behaviors.    IMPAIRMENTS are limiting patient from ADLs, IADLs, and social participation.    COMORBIDITIES may have co-morbidities  that affects occupational performance. Patient will benefit from skilled OT to address above impairments and improve overall function.   MODIFICATION OR ASSISTANCE TO COMPLETE EVALUATION: Min-Moderate modification of tasks or assist with assess necessary to complete an evaluation.   OT  OCCUPATIONAL PROFILE AND HISTORY: Detailed assessment: Review of records and additional review of physical, cognitive, psychosocial history related to current functional performance.   CLINICAL DECISION MAKING: Moderate - several treatment options, min-mod task modification necessary   REHAB POTENTIAL: Good   EVALUATION COMPLEXITY: Moderate      PLAN: OT FREQUENCY: 2x/week   OT DURATION: 12 weeks   PLANNED INTERVENTIONS: self care/ADL training, therapeutic exercise, therapeutic activity, neuromuscular re-education, manual therapy, passive range of motion, and paraffin   RECOMMENDED OTHER SERVICES: ST, and PT   CONSULTED AND AGREED WITH PLAN OF CARE: Patient   PLAN FOR NEXT SESSION:  L hand strengthening and coordination exercises    Olegario Messier, MS, OTR/L  12/23/22, 11:24 AM

## 2022-12-23 NOTE — Therapy (Signed)
OUTPATIENT PHYSICAL THERAPY  TREATMENT     Patient Name: Alyssa Mayo MRN: [id number] DOB:08/23/1945, 77 y.o., female Today's Date: 12/04/2022   END OF SESSION:    PT End of Session - 12/23/22 1103     Visit Number 24    Number of Visits 36    Date for PT Re-Evaluation 01/22/23    Authorization Type [employer]    Progress Note Due on Visit 30    PT Start Time 1104    PT Stop Time 1147    PT Time Calculation (min) 43 min    Equipment Utilized During Treatment Gait belt    Activity Tolerance Patient tolerated treatment well;No increased pain    Behavior During Therapy [facility] for tasks assessed/performed                                      Past Medical History:  Diagnosis Date   Abnormal levels of other serum enzymes 07/17/2015   Arthritis     Constipation 07/11/2015   COVID-19 03/06/2021   Diabetes mellitus without complication (HCC)     Elevated liver enzymes     Fatty liver     Generalized abdominal pain 07/11/2015   Hyperlipidemia     Hypertension     Lifelong obesity     Macular degeneration     Morbid obesity due to excess calories (HCC) 07/11/2015   Other fatigue 07/11/2015   Sleep apnea     Spinal headache           Past Surgical History:  Procedure Laterality Date   ABDOMINAL HYSTERECTOMY       APPENDECTOMY       CATARACT EXTRACTION       CERVICAL LAMINECTOMY   07/21/1985   CESAREAN SECTION       COLONOSCOPY   07/21/2012    diverticulosis, [facility] Dr. [name]    COLONOSCOPY WITH PROPOFOL N/A 02/04/2019    Procedure: COLONOSCOPY WITH PROPOFOL;  Surgeon: [name], MD;  Location: [facility] ENDOSCOPY;  Service: Endoscopy;  Laterality: N/A;   COLONOSCOPY WITH PROPOFOL N/A 03/18/2021    Procedure: COLONOSCOPY WITH PROPOFOL;  Surgeon: [name], MD;  Location: [facility] ENDOSCOPY;  Service: Endoscopy;  Laterality: N/A;  DM   DIAGNOSTIC LAPAROSCOPY       ESOPHAGOGASTRODUODENOSCOPY (EGD) WITH PROPOFOL N/A 02/04/2019    Procedure:  ESOPHAGOGASTRODUODENOSCOPY (EGD) WITH PROPOFOL;  Surgeon: [name], MD;  Location: [facility] ENDOSCOPY;  Service: Endoscopy;  Laterality: N/A;   ESOPHAGOGASTRODUODENOSCOPY (EGD) WITH PROPOFOL N/A 03/18/2021    Procedure: ESOPHAGOGASTRODUODENOSCOPY (EGD) WITH PROPOFOL;  Surgeon: [name], MD;  Location: [facility] ENDOSCOPY;  Service: Endoscopy;  Laterality: N/A;   LOOP RECORDER INSERTION N/A 08/05/2021    Procedure: LOOP RECORDER INSERTION;  Surgeon: [name], MD;  Location: [facility] INVASIVE CV LAB;  Service: Cardiovascular;  Laterality: N/A;   SPINE SURGERY       TUBAL LIGATION            Patient Active Problem List    Diagnosis Date Noted   UTI (urinary tract infection) 09/01/2022   COVID-19 virus infection 09/01/2022   Chronic diastolic CHF (congestive heart failure) (HCC) 09/01/2022   Chronic kidney disease, stage 3a (HCC) 09/01/2022   Obesity (BMI 30-39.9) 09/01/2022   Paroxysmal atrial fibrillation (HCC) 08/08/2022   Hypercoagulable state due to paroxysmal atrial fibrillation (HCC) 08/08/2022   Cerebral edema (HCC) 08/07/2021  OSA (obstructive sleep apnea) 08/07/2021   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   CVA (cerebral vascular accident) (HCC) 08/01/2021   GERD (gastroesophageal reflux disease) 07/20/2019   Advanced care planning/counseling discussion 12/17/2016   Skin lesions, generalized 11/13/2016   Chronic right hip pain 06/17/2016   Vitamin D deficiency 09/26/2015   Diastasis recti 08/08/2015   Elevated serum GGT level 07/24/2015   Essential hypertension 07/17/2015   Fatty liver 07/17/2015   Elevated alkaline phosphatase level 07/17/2015   Elevated serum glutamic pyruvic transaminase (SGPT) level 07/17/2015   Abnormal finding on EKG 07/11/2015   Morbid obesity (HCC) 07/11/2015   Colon, diverticulosis 07/11/2015   Abdominal wall hernia 07/11/2015   DM (diabetes mellitus), type 2 with neurological complications (HCC)     Hyperlipidemia         PCP: [name], MD   REFERRING PROVIDER: [name], MD & Edilia Bo, [place]   REFERRING DIAG: [phone] (ICD-10-CM) - Cerebral infarction due to unspecified occlusion or stenosis of unspecified precerebral arteries, Cervicalgia with degenerative disc   THERAPY DIAG:  Muscle weakness (generalized)   Other lack of coordination   Difficulty in walking, not elsewhere classified   Cervical pain   Pain in left hip   Painful cervical ROM   Apraxia   Unsteadiness on feet   Rationale for Evaluation and Treatment: Rehabilitation   ONSET DATE: 09/03/22   SUBJECTIVE:                                                                                                                                                                                                          SUBJECTIVE STATEMENT:   Pt states that she is doing well this AM. No pain to speak of at time of PT treatment, but states that she had some pain last night in L shoulder and L hip. It has resolved. Pt reports that pain/stiffness may be related to limited physical activity throughout the day yesterday.   Was able to go to graduation party for family member, and walk through yard without any trips or stumbles.    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 Pt now currently being seen for outpatient OT and ST services. Pt reports limitations in stamina persist. Other PMH is significant for HTN, HLD, macular degeneration, DM, obesity, sleep apnea, arthritis, chronic R hip pain, vitamin D deficiency, diastasis recti, abdominal wall  hernia, hx of abdominal hysterectomy, appendectomy, spine surgery (years ago).    PAIN:  Are you having pain? Yes: NPRS scale: 2/10 Pain location:  L hip/L knee  Pain description: dull ache Aggravating factors:  sitting Relieving factors: laying down   PRECAUTIONS: None   WEIGHT BEARING RESTRICTIONS: No   FALLS:  Has patient fallen in last 6 months? Yes. Number of falls 2   LIVING ENVIRONMENT: Lives with: lives with their son Lives in: House/apartment Stairs: Yes: Internal: 13 steps; doesn't go up to the second floor and External: 4 steps; can reach both Has following equipment at home: Single point cane and Walker - 2 wheeled   OCCUPATION: Retired   PLOF: Independent with basic ADLs   PATIENT GOALS: "Get stronger. And reduce fall risk. Reduce reliance on cane"   NEXT MD VISIT: end of May.    OBJECTIVE:    DIAGNOSTIC FINDINGS:      CT HEAD WITHOUT CONTRAST  CT CERVICAL SPINE WITHOUT CONTRAST IMPRESSION: 1. No acute intracranial pathology. 2. Expected interval evolution of encephalomalacia of the right frontal lobe, following intraparenchymal hemorrhage seen acutely on examination dated 08/03/2021. 3. No fracture or static subluxation of the cervical spine. 4. Generally mild disc space height loss and osteophytosis throughout the cervical spine, with partial ankylosis of C5-C6 and a large left eccentric posterior bridging osteophyte at this level. MRI may be used to further evaluate cervical disc and neural foraminal pathology if indicated by neurologically localizing signs and symptoms.   EXAM: xRay PELVIS - 1-2 VIEW  IMPRESSION: 1. No convincing evidence of acute fracture or diastasis of the pelvis. 2. Well corticated osseous focus beneath the right inferior pubic ramus may reflect sequela of avulsion injury or chronic tendinopathy, suggest correlation with direct tenderness to palpation.     PATIENT SURVEYS:  NDI 15/50 = 30% disability FOTO 65/68   10/23/2022 : 52/68  10/23/2022 Foto CVA 57/59   COGNITION: Overall cognitive status: History of cognitive impairments - at baseline   SENSATION: WFL   POSTURE: rounded shoulders and forward head   PALPATION:   tender to touch on L ischial tuberosity.                CERVICAL ROM:    Active ROM A/PROM (deg) eval 3/21 4/4  Flexion 39 25 39  Extension 10 24 25   Right lateral flexion 7 15 15   Left lateral flexion 6 20 14   Right rotation 49 53 41  Left rotation 30 42 35   (Blank rows = not tested)   UPPER EXTREMITY ROM:   Active ROM Right eval Left eval Right 4/4 Left 4/4  Shoulder flexion 128 81 149 22  Shoulder abduction 160 68  158  38   (Blank rows = not tested)     UPPER EXTREMITY MMT:   Grossly decreased (3+/5) in the L UE due to pain and lack of ROM     CERVICAL SPECIAL TESTS:  Spurling's test: Negative and Sharp pursor's test: Negative           LOWER EXTREMITY MMT:     MMT Right 3/21 Left 3/21  Hip flexion 4+ 4-  Hip extension 4+ 4-  Hip abduction 4+ 4  Hip adduction 4+ 4  Hip internal rotation      Hip external rotation      Knee flexion 4+ 4-  Knee extension 4+ 4  Ankle dorsiflexion 4+ 4  Ankle plantarflexion      Ankle inversion  Ankle eversion       (Blank rows = not tested)   LOWER EXTREMITY ROM:      Passive  Right 3/21 Left 3/21  Hip flexion 120 105  Hip extension      Hip abduction      Hip adduction      Hip internal rotation      Hip external rotation      Knee flexion [facility] WFL  Knee extension (in Hip flexion 90deg) 143 120  Ankle dorsiflexion      Ankle plantarflexion      Ankle inversion      Ankle eversion       (Blank rows = not tested)      LOWER EXTREMITY SPECIAL TESTS:  Hip special tests: Luisa Hart (FABER) test: positive , Hip scouring test: negative, and distraction: negative     FUNCTIONAL TESTS:  6 minute walk test: 939ft with SPC 10 meter walk test: 0.39m/s Not performed at this time.   TODAY'S TREATMENT: DATE: 12/04/22   Standing therex:  Hip abduction 2 x 11 Mini squats 2 x 10  Reciprocal foot tap on 4 inch step. 2 x 10 BUE with UE support on   Forward step up x 8 bil with 1 UE support on the RLE and 2  UE support on the LLE.  Reverse step up x 6 BLE with 1-2 UE support on BLE. Noted to have increased difficulty with reverse step up on the right lower extremity compared to left lower extremity.  Resisted gait training with 2.5 ankle weights 3 x 126ft. Pt noted to have increased step height on the LLE on the second and third bout.    dynamic gait training to navigate obstacle course with 2.5# ankle weights on BLE.  Step onto and off of 2 bolsters and over 6 inch cones in floor.  Performed x 6 bouts Supervision assist provided from PT throughout dynamic gait training with 1 instance of contact-guard assist to prevent anterior loss of balance stepping off of Airex pad.    PATIENT EDUCATION:  Education details: Pt educated throughout session about proper posture and technique with exercises. Improved exercise technique, movement at target joints, use of target muscles after min to mod verbal, visual, tactile cues.   Person educated: Patient and   Education method: Explanation Education comprehension: verbalized understanding   HOME EXERCISE PROGRAM:   Access Code: [email or web address] URL: [email or web address] Date: 09/18/2022 Prepared by: Tomasa Hose   Exercises - Seated Upper Trapezius Stretch  - 1 x daily - 7 x weekly - 3 sets - 10 reps - Seated Levator Scapulae Stretch  - 1 x daily - 7 x weekly - 3 sets - 10 reps - Seated Scapular Retraction  - 1 x daily - 7 x weekly - 3 sets - 10 reps   ASSESSMENT:   CLINICAL IMPRESSION: Patient presents with good motivation for completion of physical therapy activities.  PT treatment focused on bilateral lower extremity strength and functional movement patterns.  Patient reports no increase in pain in the left lower extremity or left upper extremity throughout session.  Continues to demonstrate guarding of the left upper extremity and limited active shoulder flexion when reaching for upper extremity support at the rail on the wall.  Patient  able to demonstrate improved step length and height on the left lower extremity with min cues from PT throughout dynamic gait training.  Only 1 mild overt loss of balance when stepping down from Airex pad on seventh  attempt.  Pt will continue to benefit from skilled physical therapy intervention to address impairments, improve QOL, and attain therapy goals.            OBJECTIVE IMPAIRMENTS: decreased activity tolerance, decreased cognition, decreased ROM, decreased strength, decreased safety awareness, hypomobility, and pain.    ACTIVITY LIMITATIONS: carrying, lifting, bending, sitting, standing, squatting, and reach over head   PARTICIPATION LIMITATIONS: meal prep, cleaning, laundry, driving, shopping, community activity, and yard work   PERSONAL FACTORS: Age, Behavior pattern, Past/current experiences, Time since onset of injury/illness/exacerbation, and 3+ comorbidities: arthritis, DM2, HTN, Obesity,   are also affecting patient's functional outcome.    REHAB POTENTIAL: Good   CLINICAL DECISION MAKING: Stable/uncomplicated   EVALUATION COMPLEXITY: Low     GOALS: Goals reviewed with patient? Yes   SHORT TERM GOALS: Target date: 12/11/2022       Pt will be independent with HEP in order to improve strength of BLE and ROM of the neck in order to return to PLOF. Baseline: provided on neck visit Goal status: INITIAL       LONG TERM GOALS: Target date: 01/22/2023       Patient will demonstrate improved function as evidenced by a score of 68 on FOTO measure for full participation in activities at home and in the community. Baseline: 65. 10/23/22: 52. 5/15 56 for neck and CVA Goal status: IN PROGRESS   2.  Pt to improve NDI by 5 points or 10% in order to demonstrate a reduction in overall disability caused by neck pain at this time. Baseline: 15/50 or 30% disability 4/11 27/50  Goal status: NOT MET   3.  Pt to demonstrate improved cervical rotation to 60 deg bilaterally in order to  perform tasks around the house that require turning of the head. Baseline: R/L Cervical Rotation: 49/30. 4/424 R:53. L:42  5/14: R45deg. L50deg  Goal status: IN PROGRESS   4.  Pt to report 0/10 pain in the neck following light household chores at home in order to demonstrate an overall reduction in pain and an improvement in QoL. Baseline: 4/10 at rest 0/10 in neck Goal status: MET   5.  Pt will improve gait speed to <1.45m/s to indicate improved community mobility and reduced fall risk  Baseline: 0.74m/s  Goal status: revised    6. Pt will increase 6 min walk test >1040ft  Baseline: 924ft with SPC Goal status: in progress.    PLAN:   PT FREQUENCY: 1-2x/week   PT DURATION: 12 weeks   PLANNED INTERVENTIONS: Therapeutic exercises, Therapeutic activity, Neuromuscular re-education, Balance training, Gait training, Patient/Family education, Self Care, Joint mobilization, Joint manipulation, Stair training, Vestibular training, Canalith repositioning, Visual/preceptual remediation/compensation, DME instructions, Dry Needling, Cognitive remediation, Electrical stimulation, Spinal manipulation, Spinal mobilization, Cryotherapy, Moist heat, Traction, Ultrasound, Manual therapy, and Re-evaluation       PLAN FOR NEXT SESSION:      Increased strengthening and dynamic balance training.  Re-assess HEP   Manual therapy as needed for pain management on LLE.      Grier Rocher PT, DPT  Physical Therapist - [facility]  12:17 PM 12/04/22

## 2022-12-25 ENCOUNTER — Ambulatory Visit: Payer: Medicare HMO | Admitting: Physical Therapy

## 2022-12-25 ENCOUNTER — Ambulatory Visit: Payer: Medicare HMO | Admitting: Occupational Therapy

## 2022-12-25 DIAGNOSIS — R2681 Unsteadiness on feet: Secondary | ICD-10-CM

## 2022-12-25 DIAGNOSIS — R262 Difficulty in walking, not elsewhere classified: Secondary | ICD-10-CM

## 2022-12-25 DIAGNOSIS — M6281 Muscle weakness (generalized): Secondary | ICD-10-CM | POA: Diagnosis not present

## 2022-12-25 DIAGNOSIS — M542 Cervicalgia: Secondary | ICD-10-CM

## 2022-12-25 DIAGNOSIS — R278 Other lack of coordination: Secondary | ICD-10-CM

## 2022-12-25 DIAGNOSIS — M25552 Pain in left hip: Secondary | ICD-10-CM

## 2022-12-25 NOTE — Therapy (Signed)
OUTPATIENT PHYSICAL THERAPY  TREATMENT     Patient Name: Alyssa Mayo MRN: [id number] DOB:1945/11/27, 77 y.o., female Today's Date: 12/04/2022   END OF SESSION:    PT End of Session - 12/25/22 1107     Visit Number 25    Number of Visits 36    Date for PT Re-Evaluation 01/22/23    Authorization Type [employer]    Progress Note Due on Visit 30    PT Start Time 1105    PT Stop Time 1146    PT Time Calculation (min) 41 min    Equipment Utilized During Treatment Gait belt    Activity Tolerance Patient tolerated treatment well;No increased pain    Behavior During Therapy [facility] for tasks assessed/performed                                      Past Medical History:  Diagnosis Date   Abnormal levels of other serum enzymes 07/17/2015   Arthritis     Constipation 07/11/2015   COVID-19 03/06/2021   Diabetes mellitus without complication (HCC)     Elevated liver enzymes     Fatty liver     Generalized abdominal pain 07/11/2015   Hyperlipidemia     Hypertension     Lifelong obesity     Macular degeneration     Morbid obesity due to excess calories (HCC) 07/11/2015   Other fatigue 07/11/2015   Sleep apnea     Spinal headache           Past Surgical History:  Procedure Laterality Date   ABDOMINAL HYSTERECTOMY       APPENDECTOMY       CATARACT EXTRACTION       CERVICAL LAMINECTOMY   07/21/1985   CESAREAN SECTION       COLONOSCOPY   07/21/2012    diverticulosis, [facility] Dr. [name]    COLONOSCOPY WITH PROPOFOL N/A 02/04/2019    Procedure: COLONOSCOPY WITH PROPOFOL;  Surgeon: [name], MD;  Location: [facility] ENDOSCOPY;  Service: Endoscopy;  Laterality: N/A;   COLONOSCOPY WITH PROPOFOL N/A 03/18/2021    Procedure: COLONOSCOPY WITH PROPOFOL;  Surgeon: [name], MD;  Location: [facility] ENDOSCOPY;  Service: Endoscopy;  Laterality: N/A;  DM   DIAGNOSTIC LAPAROSCOPY       ESOPHAGOGASTRODUODENOSCOPY (EGD) WITH PROPOFOL N/A 02/04/2019    Procedure:  ESOPHAGOGASTRODUODENOSCOPY (EGD) WITH PROPOFOL;  Surgeon: [name], MD;  Location: [facility] ENDOSCOPY;  Service: Endoscopy;  Laterality: N/A;   ESOPHAGOGASTRODUODENOSCOPY (EGD) WITH PROPOFOL N/A 03/18/2021    Procedure: ESOPHAGOGASTRODUODENOSCOPY (EGD) WITH PROPOFOL;  Surgeon: [name], MD;  Location: [facility] ENDOSCOPY;  Service: Endoscopy;  Laterality: N/A;   LOOP RECORDER INSERTION N/A 08/05/2021    Procedure: LOOP RECORDER INSERTION;  Surgeon: [name], MD;  Location: [facility] INVASIVE CV LAB;  Service: Cardiovascular;  Laterality: N/A;   SPINE SURGERY       TUBAL LIGATION            Patient Active Problem List    Diagnosis Date Noted   UTI (urinary tract infection) 09/01/2022   COVID-19 virus infection 09/01/2022   Chronic diastolic CHF (congestive heart failure) (HCC) 09/01/2022   Chronic kidney disease, stage 3a (HCC) 09/01/2022   Obesity (BMI 30-39.9) 09/01/2022   Paroxysmal atrial fibrillation (HCC) 08/08/2022   Hypercoagulable state due to paroxysmal atrial fibrillation (HCC) 08/08/2022   Cerebral edema (HCC) 08/07/2021  OSA (obstructive sleep apnea) 08/07/2021   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   CVA (cerebral vascular accident) (HCC) 08/01/2021   GERD (gastroesophageal reflux disease) 07/20/2019   Advanced care planning/counseling discussion 12/17/2016   Skin lesions, generalized 11/13/2016   Chronic right hip pain 06/17/2016   Vitamin D deficiency 09/26/2015   Diastasis recti 08/08/2015   Elevated serum GGT level 07/24/2015   Essential hypertension 07/17/2015   Fatty liver 07/17/2015   Elevated alkaline phosphatase level 07/17/2015   Elevated serum glutamic pyruvic transaminase (SGPT) level 07/17/2015   Abnormal finding on EKG 07/11/2015   Morbid obesity (HCC) 07/11/2015   Colon, diverticulosis 07/11/2015   Abdominal wall hernia 07/11/2015   DM (diabetes mellitus), type 2 with neurological complications (HCC)     Hyperlipidemia         PCP: [name], MD   REFERRING PROVIDER: [name], MD & Edilia Bo, [place]   REFERRING DIAG: [phone] (ICD-10-CM) - Cerebral infarction due to unspecified occlusion or stenosis of unspecified precerebral arteries, Cervicalgia with degenerative disc   THERAPY DIAG:  Muscle weakness (generalized)   Other lack of coordination   Difficulty in walking, not elsewhere classified   Cervical pain   Pain in left hip   Painful cervical ROM   Apraxia   Unsteadiness on feet   Rationale for Evaluation and Treatment: Rehabilitation   ONSET DATE: 09/03/22   SUBJECTIVE:                                                                                                                                                                                                          SUBJECTIVE STATEMENT:   Pt states that she is doing well this AM.  No pain prior to or throughout session.  Plans to have a relaxing weekend.  PERTINENT HISTORY:    Per chart and confirmed by pt: Pt is a 77 yo female with RMCA CVA infarction with hemorrhagic transformation 08/01/2021, with inpatient rehab 08/07/2021-08/22/2021 followed by [facility] therapy until June. Pt now currently being seen for outpatient OT and ST services. Pt reports limitations in stamina persist. Other PMH is significant for HTN, HLD, macular degeneration, DM, obesity, sleep apnea, arthritis, chronic R hip pain, vitamin D deficiency, diastasis recti, abdominal wall hernia, hx of abdominal hysterectomy, appendectomy, spine surgery (years ago).    PAIN:  Are you having pain? Yes: NPRS scale: 2/10 Pain location:  L hip/L knee  Pain description: dull ache Aggravating factors: sitting Relieving factors: laying down   PRECAUTIONS: None   WEIGHT BEARING RESTRICTIONS: No  FALLS:  Has patient fallen in last 6 months? Yes. Number of falls 2   LIVING ENVIRONMENT: Lives with: lives with their son Lives in: House/apartment Stairs: Yes: Internal:  13 steps; doesn't go up to the second floor and External: 4 steps; can reach both Has following equipment at home: Single point cane and Walker - 2 wheeled   OCCUPATION: Retired   PLOF: Independent with basic ADLs   PATIENT GOALS: "Get stronger. And reduce fall risk. Reduce reliance on cane"   NEXT MD VISIT: end of May.    OBJECTIVE:    DIAGNOSTIC FINDINGS:      CT HEAD WITHOUT CONTRAST  CT CERVICAL SPINE WITHOUT CONTRAST IMPRESSION: 1. No acute intracranial pathology. 2. Expected interval evolution of encephalomalacia of the right frontal lobe, following intraparenchymal hemorrhage seen acutely on examination dated 08/03/2021. 3. No fracture or static subluxation of the cervical spine. 4. Generally mild disc space height loss and osteophytosis throughout the cervical spine, with partial ankylosis of C5-C6 and a large left eccentric posterior bridging osteophyte at this level. MRI may be used to further evaluate cervical disc and neural foraminal pathology if indicated by neurologically localizing signs and symptoms.   EXAM: xRay PELVIS - 1-2 VIEW  IMPRESSION: 1. No convincing evidence of acute fracture or diastasis of the pelvis. 2. Well corticated osseous focus beneath the right inferior pubic ramus may reflect sequela of avulsion injury or chronic tendinopathy, suggest correlation with direct tenderness to palpation.     PATIENT SURVEYS:  NDI 15/50 = 30% disability FOTO 65/68   10/23/2022 : 52/68  10/23/2022 Foto CVA 57/59   COGNITION: Overall cognitive status: History of cognitive impairments - at baseline   SENSATION: WFL   POSTURE: rounded shoulders and forward head   PALPATION:  tender to touch on L ischial tuberosity.                CERVICAL ROM:    Active ROM A/PROM (deg) eval 3/21 4/4  Flexion 39 25 39  Extension 10 24 25   Right lateral flexion 7 15 15   Left lateral flexion 6 20 14   Right rotation 49 53 41  Left rotation 30 42 35   (Blank  rows = not tested)   UPPER EXTREMITY ROM:   Active ROM Right eval Left eval Right 4/4 Left 4/4  Shoulder flexion 128 81 149 22  Shoulder abduction 160 68  158  38   (Blank rows = not tested)     UPPER EXTREMITY MMT:   Grossly decreased (3+/5) in the L UE due to pain and lack of ROM     CERVICAL SPECIAL TESTS:  Spurling's test: Negative and Sharp pursor's test: Negative           LOWER EXTREMITY MMT:     MMT Right 3/21 Left 3/21  Hip flexion 4+ 4-  Hip extension 4+ 4-  Hip abduction 4+ 4  Hip adduction 4+ 4  Hip internal rotation      Hip external rotation      Knee flexion 4+ 4-  Knee extension 4+ 4  Ankle dorsiflexion 4+ 4  Ankle plantarflexion      Ankle inversion      Ankle eversion       (Blank rows = not tested)   LOWER EXTREMITY ROM:      Passive  Right 3/21 Left 3/21  Hip flexion 120 105  Hip extension      Hip abduction      Hip  adduction      Hip internal rotation      Hip external rotation      Knee flexion [facility] WFL  Knee extension (in Hip flexion 90deg) 143 120  Ankle dorsiflexion      Ankle plantarflexion      Ankle inversion      Ankle eversion       (Blank rows = not tested)      LOWER EXTREMITY SPECIAL TESTS:  Hip special tests: Luisa Hart (FABER) test: positive , Hip scouring test: negative, and distraction: negative     FUNCTIONAL TESTS:  6 minute walk test: 923ft with SPC 10 meter walk test: 0.24m/s Not performed at this time.   TODAY'S TREATMENT: DATE: 12/04/22   Gait training with no AD x 354ft. Noted to hit doorway on the L side twice throughout.   Weave through 5 cones x 8 with seated rest break after 4th bout; performed with 2.5# ankle weights   Bilateral lower extremity strength and endurance training.  Weighted gait with 3 pound ankle weights.  Gait training with ankle weights performed through [facility] in  Hospital, over cement sidewalk, up-and-down handicap ramp at [facility], across access road to employee  parking lot.  Performed x 6 bouts greater then 600 feet.  Patient noted to have decreased gait speed on descent of ramps, requiring cues for increased step height on ramp to descend and a stand.  1 mild loss of balance to the right but patient able to correct without increased assist from physical therapist.  Throughout session patient required multiple prolonged seated rest breaks due to cardiovascular and bilateral lower extremity fatigue.   Patient monitored for change in status and increased pain in left lower extremity throughout session.  No shortness of breath or pain noted in the left lower extremity throughout functional weighted gait training. PATIENT EDUCATION:  Education details: Pt educated throughout session about proper posture and technique with exercises. Improved exercise technique, movement at target joints, use of target muscles after min to mod verbal, visual, tactile cues.   Person educated: Patient and   Education method: Explanation Education comprehension: verbalized understanding   HOME EXERCISE PROGRAM:   Access Code: [email or web address] URL: [email or web address] Date: 09/18/2022 Prepared by: Tomasa Hose   Exercises - Seated Upper Trapezius Stretch  - 1 x daily - 7 x weekly - 3 sets - 10 reps - Seated Levator Scapulae Stretch  - 1 x daily - 7 x weekly - 3 sets - 10 reps - Seated Scapular Retraction  - 1 x daily - 7 x weekly - 3 sets - 10 reps   ASSESSMENT:   CLINICAL IMPRESSION: Patient presents with good motivation for completion of physical therapy activities.  PT treatment focused on bilateral lower extremity strength and functional movement patterns.  No increased pain throughout physical therapy session.  Multiple therapeutic rest breaks required in weighted gait training due to bilateral lower extremity fatigue and cardiovascular fatigue.  Patient rates weighted gait training as medium to hard especially with descent of ramps.  Patient demonstrating  adequate stepping strategy to prevent loss of balance.  Pt will continue to benefit from skilled physical therapy intervention to address impairments, improve QOL, and attain therapy goals.            OBJECTIVE IMPAIRMENTS: decreased activity tolerance, decreased cognition, decreased ROM, decreased strength, decreased safety awareness, hypomobility, and pain.    ACTIVITY LIMITATIONS: carrying, lifting, bending, sitting, standing, squatting, and reach over head   PARTICIPATION LIMITATIONS: meal  prep, cleaning, laundry, driving, shopping, community activity, and yard work   PERSONAL FACTORS: Age, Behavior pattern, Past/current experiences, Time since onset of injury/illness/exacerbation, and 3+ comorbidities: arthritis, DM2, HTN, Obesity,   are also affecting patient's functional outcome.    REHAB POTENTIAL: Good   CLINICAL DECISION MAKING: Stable/uncomplicated   EVALUATION COMPLEXITY: Low     GOALS: Goals reviewed with patient? Yes   SHORT TERM GOALS: Target date: 12/11/2022       Pt will be independent with HEP in order to improve strength of BLE and ROM of the neck in order to return to PLOF. Baseline: provided on neck visit Goal status: INITIAL       LONG TERM GOALS: Target date: 01/22/2023       Patient will demonstrate improved function as evidenced by a score of 68 on FOTO measure for full participation in activities at home and in the community. Baseline: 65. 10/23/22: 52. 5/15 56 for neck and CVA Goal status: IN PROGRESS   2.  Pt to improve NDI by 5 points or 10% in order to demonstrate a reduction in overall disability caused by neck pain at this time. Baseline: 15/50 or 30% disability 4/11 27/50  Goal status: NOT MET   3.  Pt to demonstrate improved cervical rotation to 60 deg bilaterally in order to perform tasks around the house that require turning of the head. Baseline: R/L Cervical Rotation: 49/30. 4/424 R:53. L:42  5/14: R45deg. L50deg  Goal status: IN  PROGRESS   4.  Pt to report 0/10 pain in the neck following light household chores at home in order to demonstrate an overall reduction in pain and an improvement in QoL. Baseline: 4/10 at rest 0/10 in neck Goal status: MET   5.  Pt will improve gait speed to <1.91m/s to indicate improved community mobility and reduced fall risk  Baseline: 0.91m/s  Goal status: revised    6. Pt will increase 6 min walk test >1070ft  Baseline: 919ft with SPC Goal status: in progress.    PLAN:   PT FREQUENCY: 1-2x/week   PT DURATION: 12 weeks   PLANNED INTERVENTIONS: Therapeutic exercises, Therapeutic activity, Neuromuscular re-education, Balance training, Gait training, Patient/Family education, Self Care, Joint mobilization, Joint manipulation, Stair training, Vestibular training, Canalith repositioning, Visual/preceptual remediation/compensation, DME instructions, Dry Needling, Cognitive remediation, Electrical stimulation, Spinal manipulation, Spinal mobilization, Cryotherapy, Moist heat, Traction, Ultrasound, Manual therapy, and Re-evaluation       PLAN FOR NEXT SESSION:      Increased strengthening and dynamic balance training.  Re-assess HEP   Manual therapy as needed for pain management on LLE.      Grier Rocher PT, DPT  Physical Therapist - [facility]  12:17 PM 12/04/22

## 2022-12-25 NOTE — Therapy (Signed)
Occupational Therapy Neuro Treatment Note   Patient Name: Alyssa Mayo MRN: [id number] DOB:Apr 21, 1946, 77 y.o., female Today's Date: 03/14/2022   PCP: [name], MD REFERRING PROVIDER: [name], NP     OT End of Session - 12/25/22 1215     Visit Number 25    Number of Visits 48    Date for OT Re-Evaluation 02/24/23    Authorization Type Progress reporting period starting 09/16/2022    OT Start Time 1150    OT Stop Time 1230    OT Time Calculation (min) 40 min    Activity Tolerance Patient tolerated treatment well    Behavior During Therapy [facility] for tasks assessed/performed                                    Past Medical History:  Diagnosis Date   Abnormal levels of other serum enzymes 07/17/2015   Arthritis     Constipation 07/11/2015   COVID-19 03/06/2021   Diabetes mellitus without complication (HCC)     Elevated liver enzymes     Fatty liver     Generalized abdominal pain 07/11/2015   Hyperlipidemia     Hypertension     Lifelong obesity     Macular degeneration     Morbid obesity due to excess calories (HCC) 07/11/2015   Other fatigue 07/11/2015   Sleep apnea     Spinal headache           Past Surgical History:  Procedure Laterality Date   ABDOMINAL HYSTERECTOMY       APPENDECTOMY       CATARACT EXTRACTION       CERVICAL LAMINECTOMY   07/21/1985   CESAREAN SECTION       COLONOSCOPY   07/21/2012    diverticulosis, [facility] Dr. [name]    COLONOSCOPY WITH PROPOFOL N/A 02/04/2019    Procedure: COLONOSCOPY WITH PROPOFOL;  Surgeon: [name], MD;  Location: [facility] ENDOSCOPY;  Service: Endoscopy;  Laterality: N/A;   COLONOSCOPY WITH PROPOFOL N/A 03/18/2021    Procedure: COLONOSCOPY WITH PROPOFOL;  Surgeon: [name], MD;  Location: [facility] ENDOSCOPY;  Service: Endoscopy;  Laterality: N/A;  DM   DIAGNOSTIC LAPAROSCOPY       ESOPHAGOGASTRODUODENOSCOPY (EGD) WITH PROPOFOL N/A 02/04/2019    Procedure: ESOPHAGOGASTRODUODENOSCOPY (EGD)  WITH PROPOFOL;  Surgeon: [name], MD;  Location: [facility] ENDOSCOPY;  Service: Endoscopy;  Laterality: N/A;   ESOPHAGOGASTRODUODENOSCOPY (EGD) WITH PROPOFOL N/A 03/18/2021    Procedure: ESOPHAGOGASTRODUODENOSCOPY (EGD) WITH PROPOFOL;  Surgeon: [name], MD;  Location: [facility] ENDOSCOPY;  Service: Endoscopy;  Laterality: N/A;   LOOP RECORDER INSERTION N/A 08/05/2021    Procedure: LOOP RECORDER INSERTION;  Surgeon: [name], MD;  Location: [facility] INVASIVE CV LAB;  Service: Cardiovascular;  Laterality: N/A;   SPINE SURGERY       TUBAL LIGATION            Patient Active Problem List    Diagnosis Date Noted   Paroxysmal atrial fibrillation (HCC) 08/08/2022   Hypercoagulable state due to paroxysmal atrial fibrillation (HCC) 08/08/2022   Cerebral edema (HCC) 08/07/2021   OSA (obstructive sleep apnea) 08/07/2021   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   CVA (cerebral vascular accident) (HCC) 08/01/2021   GERD (gastroesophageal reflux disease) 07/20/2019   Advanced care planning/counseling discussion 12/17/2016   Skin lesions, generalized 11/13/2016   Chronic right hip pain 06/17/2016  Vitamin D deficiency 09/26/2015   Diastasis recti 08/08/2015   Elevated serum GGT level 07/24/2015   Essential hypertension 07/17/2015   Fatty liver 07/17/2015   Elevated alkaline phosphatase level 07/17/2015   Elevated serum glutamic pyruvic transaminase (SGPT) level 07/17/2015   Abnormal finding on EKG 07/11/2015   Morbid obesity (HCC) 07/11/2015   Colon, diverticulosis 07/11/2015   Abdominal wall hernia 07/11/2015   DM (diabetes mellitus), type 2 with neurological complications (HCC)     Hyperlipidemia        ONSET DATE: 08/01/2021   REFERRING DIAG: CVA   THERAPY DIAG:  Muscle weakness (generalized)   Rationale for Evaluation and Treatment Rehabilitation   SUBJECTIVE:    SUBJECTIVE STATEMENT:    Pt.  reports having a graduation party this weekend.   Pt  accompanied by: self   PERTINENT HISTORY:  Pt. is a 77 y.o. female who had a unexpectedly hospitalized from 2/12-2/14/2024 for COVID-19, and a UTI.  Pt. 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 Pt. attended inpatient rehab from 08/07/2021-08/22/2021. Pt. received Home health therapy services this past spring.  Pt. has recently had an assessment through driver rehabilitation services at [facility] with recommendations for referrals for  outpatient OT/PT, and ST services. Pt. PMHx includes: HTN, Hyperlipidemia, Macular degeneration, DM, and Obesity.   PRECAUTIONS: None   WEIGHT BEARING RESTRICTIONS No   PAIN:  Are you having pain?  0/10 pain   FALLS: Has patient fallen in last 6 months? Yes.   LIVING ENVIRONMENT: Lives with: lives with their family  Son  [name] Lives in: House/apartment Main living are on one floor Stairs:  2 steps to enter Has following equipment at home: Single point cane, Wheelchair (manual), shower chair, Grab bars, bed rail, and rubber mat.   PLOF: Independent   PATIENT GOALS: To be able to Drive, and cook again   OBJECTIVE:    HAND DOMINANCE: Right    ADLs:  Transfers/ambulation related to ADLs: Independent Eating: Independent Grooming: [employer] Pt. reports being unable to use her left hand to perform haircare.  UB Dressing: Independent pullover shirt, independent now with fastening a bra LB Dressing: Independent donning pants socks, and slide on shoes Toileting: Independent Bathing: Independent Tub Shower transfers: Walk-in shower Independent now     IADLs: Shopping: Needs to be accompanied to the grocery store. Light housekeeping: Do  Meal Prep:  Is able to perform light meal prep Community mobility: Relies on son, and friend Medication management: Son sets up Mining engineer management:TBD Handwriting: TBD   MOBILITY STATUS: Hx of falls out of bed     ACTIVITY TOLERANCE: Activity tolerance:   10-20 min. Before rest break   FUNCTIONAL OUTCOME MEASURES: FOTO: 61  TR score: 62  10/09/2022: FOTO: 49   UPPER EXTREMITY ROM      Active ROM Right eval Left eval Left  10/09/2022 Left 10/23/22 Left 12/02/2022  Shoulder flexion WFL 54(74) scaption 0(55) 26(80) 32(82)  Shoulder abduction WFL 58(75) 32(54) 32(50) 45(60)  Shoulder adduction         Shoulder extension         Shoulder internal rotation         Shoulder external rotation         Elbow flexion [facility] WFL 145 145 145  Elbow extension [facility] WFL 10(0) 10(0) WFL  Wrist flexion WFL TBD [facility] WFL  Wrist extension WFL TBD [facility] WFL  Wrist ulnar deviation  Wrist radial deviation         Wrist pronation         Wrist supination         (Blank rows = not tested)     UPPER EXTREMITY MMT:      MMT Right eval Left eval Left 10/09/2022 Left 10/23/2022 Left 12/02/2022  Shoulder flexion 4+/5 2+/5 1/5 2-/5 2-/5  Shoulder abduction 4+/5 2+/5 2-/5 2-/5 2-/5  Shoulder adduction         Shoulder extension         Shoulder internal rotation         Shoulder external rotation         Middle trapezius         Lower trapezius         Elbow flexion 4+/5 3+/5 3/5 3/5 3+/5  Elbow extension 4+/5 3+/5 3/5 3/5 3+/5  Wrist flexion 4+/5 TBD 3/5 3/5 3+/5  Wrist extension 4+/5 TBD 3/5 3/5 3+/5  Wrist ulnar deviation         Wrist radial deviation         Wrist pronation         Wrist supination         (Blank rows = not tested)   HAND FUNCTION: Grip strength: Right: 33 lbs; Left: 11 lbs, Lateral pinch: Right: 17 lbs, Left: 2 lbs, and 3 point pinch: Right: 17 lbs, Left: 2 lbs  10/09/2022: Grip strength: Right: 33 lbs; Left: 10 lbs, Lateral pinch: Right: 17 lbs, Left: 1 lbs, and 3 point pinch: Right: 17 lbs, Left: 1 lbs   10/23/2022: Grip strength: Right: 33 lbs; Left: 16 lbs, Lateral pinch: Right: 17 lbs, Left: 1 lbs, and 3 point pinch: Right: 17 lbs, Left: 1 lbs  12/02/2022: Grip strength: Right: 33 lbs; Left: 17 lbs, Lateral  pinch: Right: 17 lbs, Left: 7 lbs, and 3 point pinch: Right: 17 lbs, Left: 3 lbs     COORDINATION:   9 Hole Peg test: Right: 27 sec.  sec; Left: 52 sec.  10/09/2022: 9 Hole Peg test: Right: 27 sec.  sec; Left: 1 min. & 35 sec.  SENSATION: Light touch: WFL Proprioception: WFL   COGNITION: Overall cognitive status: Within functional limits for tasks assessed   VISION: Subjective report: Glasses. Just received a new prescription to increase bifocal strength Baseline vision: Macular Degeneration Visual history: macular degeneration   VISION ASSESSMENT: To be further assessed in functional context  Left sided Awareness      PERCEPTION: Limited left sided awareness     TODAY'S TREATMENT:   Therapeutic Exercise:               Pt. tolerated PROM in all joint ranges of the left shoulder. Pt. worked on [employer], and reciprocal motion using the UBE while seated for 8 min. with no resistance initially, and the resistance gradually increasing.    Manual therapy:  Pt. Tolerated soft tissue massage to the scapular musculature 2/2 tightness. Manual therapy was performed independent of, and in preparation for there. Ex.     Neuromuscular re-education:    Pt. worked on  left [facility] tasks grasping 1/2" [place] marbles, storing them in the palm of her hand, and translatory movements moving them through her hand from the palm to the tip of the 2nd digit, and thumb in preparation for discarding them into the shallow dishes. Pt. was further challenged with dual tasking through following the directions of the [facility] game while playing against  an opponent.     PATIENT EDUCATION: Education details: Compensatory strategies during typing. Person educated: Patient Education method: verbal cues Education comprehension: verbalized understanding and pt does not plan to cook unless son is present.     HOME EXERCISE PROGRAM:  Reviewed home activities to enhance left hand digit extension  during  typing skills.   GOALS: Goals reviewed with patient? Yes     SHORT TERM GOALS: Target date: 01/13/2023       Pt. Will improve FOTO score by 2 points to reflect improved pt. perceived functional performance Baseline: 12/01/2022: FOTO: 51 10/23/2022: FOTO 49 10/09/2022: FOTO 49, FOTO: 61 TR score: 62  Goal status: INITIAL   LONG TERM GOALS: Target date:02/24/2023  Pt. Will improve left shoulder ROM by 10 degrees to be able able to efficiently apply deodorant Baseline: 12/02/2022: Shoulder flexion: 32(82), Abduction: 45(60)  10/23/2022: Shoulder flexion: 26(80), Abduction: 32(50) 10/09/2022: flexion: 0(55), Abduction: 32(54) Goal status: Improving, but very limited.   2.  Pt. will be able to independently hold and use a blow dryer, and brush for hair care. Baseline: 12/02/2022: Pt. is unable to actively elevate he left shoulder in order to blow dry her hair. 10/23/2022: Pt. continues to be unable to hold a blow dryer. Eval: Pt. Is unable to sustain her BUEs in elevation, and use a blow dryer, and brush. 10/09/2022: Pt. Is unable to hold a blow dryer. Eval: Pt. Is unable to sustain her BUEs in elevation, and use a blow dryer, and brush. Goal status: Ongoing   3.  Pt. Will improve left lateral pinch strength by 3# to be able to independently cut meat. Baseline: 12/02/2022: Left pinch: 7# Pt. has difficulty cutting meat. 10/23/2022: Pt. Continues to present with difficulty cutting meat. 10/09/2022: 1#  Eval: 2#. Pt. has difficulty stabilizing utensil, and food while cutting food. Goal status: Ongoing   4.  Pt. Will improve Left hand [facility] skills to be able to be able to independently, and efficiently manipulate small objects during ADL tasks. Baseline 12/02/2022: TBD  10/23/2022: Pt. Continues to present with difficulty manipulating small objects. 10/09/2022: Left: 1 min & 35 sec.: Eval: Left: 52 sec. Right: 27 sec. Goal status: Ongoing     6.: Pt. will improve with left grip strength to be able to hold and  pull up covers. Baseline: 12/02/2022: 17# 10/23/2022: Left: 16# 10/09/2022: Left: 10# Eval:  Left grip strength 11#. Pt. has difficulty holding a full drink  securely with the left hand.             Goal status: Ongoing   7. Pt. Will improve typing speed, and accuracy in preparation for efficiently typing simple email message. Baseline: 12/02/2022:  Pt. continues to present with limited speed, and accuracy with typing.10/23/2022: Pt. continues to present with limited speed, and accuracy with typing. 10/09/2022: Pt. presents with difficulty typing. Pt. presents with difficulty typing an email. Typing speed, and accuracy TBD             Goal status: INITIAL                                        CLINICAL IMPRESSION:  Pt. reports no pain today, and tolerated the exercises well today. Pt. presents with tightness, and limited AROM in the Left shoulder initially. Pt. presented with increased ROM following there. Ex, and ROM. Pt. was able to  motor planning  through the movements needed for performing translatory movements of the hand  with the 1/2" flat marbles today during the [facility] game, however required increased verbal cues for game directions. Pt. dropped multiple flat marbles. Pt. continues to benefit from OT services to work towards improving Left UE functioning in order to maximize engagement in, and overall independence with ADLs, and IADL tasks.    PERFORMANCE DEFICITS in functional skills including ADLs, IADLs, coordination, dexterity, sensation, ROM, strength, FMC, decreased knowledge of use of DME, vision, and UE functional use, cognitive skills including attention, and psychosocial skills including coping strategies, environmental adaptation, habits, and routines and behaviors.    IMPAIRMENTS are limiting patient from ADLs, IADLs, and social participation.    COMORBIDITIES may have co-morbidities  that affects occupational performance. Patient will benefit from skilled OT to address above  impairments and improve overall function.   MODIFICATION OR ASSISTANCE TO COMPLETE EVALUATION: Min-Moderate modification of tasks or assist with assess necessary to complete an evaluation.   OT OCCUPATIONAL PROFILE AND HISTORY: Detailed assessment: Review of records and additional review of physical, cognitive, psychosocial history related to current functional performance.   CLINICAL DECISION MAKING: Moderate - several treatment options, min-mod task modification necessary   REHAB POTENTIAL: Good   EVALUATION COMPLEXITY: Moderate      PLAN: OT FREQUENCY: 2x/week   OT DURATION: 12 weeks   PLANNED INTERVENTIONS: self care/ADL training, therapeutic exercise, therapeutic activity, neuromuscular re-education, manual therapy, passive range of motion, and paraffin   RECOMMENDED OTHER SERVICES: ST, and PT   CONSULTED AND AGREED WITH PLAN OF CARE: Patient   PLAN FOR NEXT SESSION:  L hand strengthening and coordination exercises    Olegario Messier, MS, OTR/L  12/25/22, 12:18 PM

## 2022-12-29 ENCOUNTER — Ambulatory Visit (INDEPENDENT_AMBULATORY_CARE_PROVIDER_SITE_OTHER): Payer: Medicare HMO

## 2022-12-29 DIAGNOSIS — I639 Cerebral infarction, unspecified: Secondary | ICD-10-CM

## 2022-12-29 LAB — CUP PACEART REMOTE DEVICE CHECK
Date Time Interrogation Session: 20240609230607
Implantable Pulse Generator Implant Date: 20230116

## 2022-12-30 ENCOUNTER — Ambulatory Visit: Payer: Medicare HMO | Admitting: Physical Therapy

## 2022-12-30 ENCOUNTER — Ambulatory Visit: Payer: Medicare HMO | Admitting: Occupational Therapy

## 2022-12-30 DIAGNOSIS — M25552 Pain in left hip: Secondary | ICD-10-CM

## 2022-12-30 DIAGNOSIS — M6281 Muscle weakness (generalized): Secondary | ICD-10-CM

## 2022-12-30 DIAGNOSIS — R2681 Unsteadiness on feet: Secondary | ICD-10-CM

## 2022-12-30 DIAGNOSIS — R482 Apraxia: Secondary | ICD-10-CM

## 2022-12-30 DIAGNOSIS — R262 Difficulty in walking, not elsewhere classified: Secondary | ICD-10-CM

## 2022-12-30 DIAGNOSIS — I63511 Cerebral infarction due to unspecified occlusion or stenosis of right middle cerebral artery: Secondary | ICD-10-CM

## 2022-12-30 DIAGNOSIS — M542 Cervicalgia: Secondary | ICD-10-CM

## 2022-12-30 DIAGNOSIS — R278 Other lack of coordination: Secondary | ICD-10-CM

## 2022-12-30 NOTE — Therapy (Signed)
OUTPATIENT PHYSICAL THERAPY  TREATMENT     Patient Name: Alyssa Mayo MRN: [id number] DOB:12-31-1945, 77 y.o., female Today's Date: 12/04/2022   END OF SESSION:    PT End of Session - 12/30/22 1108     Visit Number 26    Number of Visits 36    Date for PT Re-Evaluation 01/22/23    Authorization Type [employer]    Progress Note Due on Visit 30    Equipment Utilized During Treatment Gait belt    Activity Tolerance Patient tolerated treatment well;No increased pain    Behavior During Therapy [facility] for tasks assessed/performed                                      Past Medical History:  Diagnosis Date   Abnormal levels of other serum enzymes 07/17/2015   Arthritis     Constipation 07/11/2015   COVID-19 03/06/2021   Diabetes mellitus without complication (HCC)     Elevated liver enzymes     Fatty liver     Generalized abdominal pain 07/11/2015   Hyperlipidemia     Hypertension     Lifelong obesity     Macular degeneration     Morbid obesity due to excess calories (HCC) 07/11/2015   Other fatigue 07/11/2015   Sleep apnea     Spinal headache           Past Surgical History:  Procedure Laterality Date   ABDOMINAL HYSTERECTOMY       APPENDECTOMY       CATARACT EXTRACTION       CERVICAL LAMINECTOMY   07/21/1985   CESAREAN SECTION       COLONOSCOPY   07/21/2012    diverticulosis, [facility] Dr. [name]    COLONOSCOPY WITH PROPOFOL N/A 02/04/2019    Procedure: COLONOSCOPY WITH PROPOFOL;  Surgeon: [name], MD;  Location: [facility] ENDOSCOPY;  Service: Endoscopy;  Laterality: N/A;   COLONOSCOPY WITH PROPOFOL N/A 03/18/2021    Procedure: COLONOSCOPY WITH PROPOFOL;  Surgeon: [name], MD;  Location: [facility] ENDOSCOPY;  Service: Endoscopy;  Laterality: N/A;  DM   DIAGNOSTIC LAPAROSCOPY       ESOPHAGOGASTRODUODENOSCOPY (EGD) WITH PROPOFOL N/A 02/04/2019    Procedure: ESOPHAGOGASTRODUODENOSCOPY (EGD) WITH PROPOFOL;  Surgeon: [name], MD;   Location: [facility] ENDOSCOPY;  Service: Endoscopy;  Laterality: N/A;   ESOPHAGOGASTRODUODENOSCOPY (EGD) WITH PROPOFOL N/A 03/18/2021    Procedure: ESOPHAGOGASTRODUODENOSCOPY (EGD) WITH PROPOFOL;  Surgeon: [name], MD;  Location: [facility] ENDOSCOPY;  Service: Endoscopy;  Laterality: N/A;   LOOP RECORDER INSERTION N/A 08/05/2021    Procedure: LOOP RECORDER INSERTION;  Surgeon: [name], MD;  Location: [facility] INVASIVE CV LAB;  Service: Cardiovascular;  Laterality: N/A;   SPINE SURGERY       TUBAL LIGATION            Patient Active Problem List    Diagnosis Date Noted   UTI (urinary tract infection) 09/01/2022   COVID-19 virus infection 09/01/2022   Chronic diastolic CHF (congestive heart failure) (HCC) 09/01/2022   Chronic kidney disease, stage 3a (HCC) 09/01/2022   Obesity (BMI 30-39.9) 09/01/2022   Paroxysmal atrial fibrillation (HCC) 08/08/2022   Hypercoagulable state due to paroxysmal atrial fibrillation (HCC) 08/08/2022   Cerebral edema (HCC) 08/07/2021   OSA (obstructive sleep apnea) 08/07/2021   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   CVA (cerebral vascular accident) (HCC)  08/01/2021   GERD (gastroesophageal reflux disease) 07/20/2019   Advanced care planning/counseling discussion 12/17/2016   Skin lesions, generalized 11/13/2016   Chronic right hip pain 06/17/2016   Vitamin D deficiency 09/26/2015   Diastasis recti 08/08/2015   Elevated serum GGT level 07/24/2015   Essential hypertension 07/17/2015   Fatty liver 07/17/2015   Elevated alkaline phosphatase level 07/17/2015   Elevated serum glutamic pyruvic transaminase (SGPT) level 07/17/2015   Abnormal finding on EKG 07/11/2015   Morbid obesity (HCC) 07/11/2015   Colon, diverticulosis 07/11/2015   Abdominal wall hernia 07/11/2015   DM (diabetes mellitus), type 2 with neurological complications (HCC)     Hyperlipidemia        PCP: [name], MD   REFERRING PROVIDER: [name], MD & Edilia Bo, [place]   REFERRING DIAG: [id number] (ICD-10-CM) - Cerebral infarction due to unspecified occlusion or stenosis of unspecified precerebral arteries, Cervicalgia with degenerative disc   THERAPY DIAG:  Muscle weakness (generalized)   Other lack of coordination   Difficulty in walking, not elsewhere classified   Cervical pain   Pain in left hip   Painful cervical ROM   Apraxia   Unsteadiness on feet   Rationale for Evaluation and Treatment: Rehabilitation   ONSET DATE: 09/03/22   SUBJECTIVE:                                                                                                                                                                                                          SUBJECTIVE STATEMENT:    Pt states that she is doing well this AM. No pain was able to go to graduation party for church member. No falls or trips reported. Pt states that she feel like she is continuing to fatigue quickly with ADL and community mobility       PERTINENT HISTORY:    Per chart and confirmed by pt: Pt is a 77 yo female with RMCA CVA infarction with hemorrhagic transformation 08/01/2021, with inpatient rehab 08/07/2021-08/22/2021 followed [facility] therapy until June. Pt now currently being seen for outpatient OT and ST services. Pt reports limitations in stamina persist. Other PMH is significant for HTN, HLD, macular degeneration, DM, obesity, sleep apnea, arthritis, chronic R hip pain, vitamin D deficiency, diastasis recti, abdominal wall hernia, hx of abdominal hysterectomy, appendectomy, spine surgery (years ago).    PAIN:  Are you having pain? Yes: NPRS scale: 2/10 Pain location:  L hip/L knee  Pain description: dull ache Aggravating factors: sitting Relieving factors: laying down   PRECAUTIONS: None  WEIGHT BEARING RESTRICTIONS: No   FALLS:  Has patient fallen in last 6 months? Yes. Number of falls 2   LIVING ENVIRONMENT: Lives with: lives with their son Lives in:  House/apartment Stairs: Yes: Internal: 13 steps; doesn't go up to the second floor and External: 4 steps; can reach both Has following equipment at home: Single point cane and Walker - 2 wheeled   OCCUPATION: Retired   PLOF: Independent with basic ADLs   PATIENT GOALS: "Get stronger. And reduce fall risk. Reduce reliance on cane"   NEXT MD VISIT: end of May.    OBJECTIVE:    DIAGNOSTIC FINDINGS:      CT HEAD WITHOUT CONTRAST  CT CERVICAL SPINE WITHOUT CONTRAST IMPRESSION: 1. No acute intracranial pathology. 2. Expected interval evolution of encephalomalacia of the right frontal lobe, following intraparenchymal hemorrhage seen acutely on examination dated 08/03/2021. 3. No fracture or static subluxation of the cervical spine. 4. Generally mild disc space height loss and osteophytosis throughout the cervical spine, with partial ankylosis of C5-C6 and a large left eccentric posterior bridging osteophyte at this level. MRI may be used to further evaluate cervical disc and neural foraminal pathology if indicated by neurologically localizing signs and symptoms.   EXAM: xRay PELVIS - 1-2 VIEW  IMPRESSION: 1. No convincing evidence of acute fracture or diastasis of the pelvis. 2. Well corticated osseous focus beneath the right inferior pubic ramus may reflect sequela of avulsion injury or chronic tendinopathy, suggest correlation with direct tenderness to palpation.     PATIENT SURVEYS:  NDI 15/50 = 30% disability FOTO 65/68   10/23/2022 : 52/68  10/23/2022 Foto CVA 57/59   COGNITION: Overall cognitive status: History of cognitive impairments - at baseline   SENSATION: WFL   POSTURE: rounded shoulders and forward head   PALPATION:  tender to touch on L ischial tuberosity.                CERVICAL ROM:    Active ROM A/PROM (deg) eval 3/21 4/4  Flexion 39 25 39  Extension 10 24 25   Right lateral flexion 7 15 15   Left lateral flexion 6 20 14   Right rotation 49 53  41  Left rotation 30 42 35   (Blank rows = not tested)   UPPER EXTREMITY ROM:   Active ROM Right eval Left eval Right 4/4 Left 4/4  Shoulder flexion 128 81 149 22  Shoulder abduction 160 68  158  38   (Blank rows = not tested)     UPPER EXTREMITY MMT:   Grossly decreased (3+/5) in the L UE due to pain and lack of ROM     CERVICAL SPECIAL TESTS:  Spurling's test: Negative and Sharp pursor's test: Negative           LOWER EXTREMITY MMT:     MMT Right 3/21 Left 3/21  Hip flexion 4+ 4-  Hip extension 4+ 4-  Hip abduction 4+ 4  Hip adduction 4+ 4  Hip internal rotation      Hip external rotation      Knee flexion 4+ 4-  Knee extension 4+ 4  Ankle dorsiflexion 4+ 4  Ankle plantarflexion      Ankle inversion      Ankle eversion       (Blank rows = not tested)   LOWER EXTREMITY ROM:      Passive  Right 3/21 Left 3/21  Hip flexion 120 105  Hip extension      Hip abduction  Hip adduction      Hip internal rotation      Hip external rotation      Knee flexion [facility] WFL  Knee extension (in Hip flexion 90deg) 143 120  Ankle dorsiflexion      Ankle plantarflexion      Ankle inversion      Ankle eversion       (Blank rows = not tested)      LOWER EXTREMITY SPECIAL TESTS:  Hip special tests: Luisa Hart (FABER) test: positive , Hip scouring test: negative, and distraction: negative     FUNCTIONAL TESTS:  6 minute walk test: 929ft with SPC 10 meter walk test: 0.18m/s Not performed at this time.   TODAY'S TREATMENT: DATE: 12/04/22  Unresisted gait through therapy gym x 169ft with SPC  Lunge on 2nd step of stairs x8 with 3 sec hold, performed bil for improved knee ROM and contralateral hip extensor stretch    Step up to 6inch step 2 x 15 bil wth 1 UE support   BLE circuit training 4# ankle weight  LAQ x 10 x2  Hip flexion x10 x 2  Hip abduction x10 x 2  Sit<>stand  x 10  x 2  Gait with 4# ankle weight x 334ft no UE support  Min cues for full ROM  and improved heel contact with gait. Noted to have decreased hip flexion with march, abduction and functional gait movement    Patient monitored for change in status and increased pain in left lower extremity throughout session.  Pt requesting therapeutic rest break due to fatigue following each bout of therex.    PATIENT EDUCATION:  Education details: Pt educated throughout session about proper posture and technique with exercises. Improved exercise technique, movement at target joints, use of target muscles after min to mod verbal, visual, tactile cues.   Person educated: Patient and   Education method: Explanation Education comprehension: verbalized understanding   HOME EXERCISE PROGRAM:   Access Code: [email or web address] URL: [email or web address] Date: 09/18/2022 Prepared by: Tomasa Hose   Exercises - Seated Upper Trapezius Stretch  - 1 x daily - 7 x weekly - 3 sets - 10 reps - Seated Levator Scapulae Stretch  - 1 x daily - 7 x weekly - 3 sets - 10 reps - Seated Scapular Retraction  - 1 x daily - 7 x weekly - 3 sets - 10 reps   ASSESSMENT:   CLINICAL IMPRESSION: Patient presents with good motivation for completion of physical therapy activities. PT treatment focused on BLE strength and endurance. Noted to require increased rest breaks on this day with emphasis on improved muscular and cardiovascular endurance. No pain reported by pt throughout session.  Pt will continue to benefit from skilled physical therapy intervention to address impairments, improve QOL, and attain therapy goals.            OBJECTIVE IMPAIRMENTS: decreased activity tolerance, decreased cognition, decreased ROM, decreased strength, decreased safety awareness, hypomobility, and pain.    ACTIVITY LIMITATIONS: carrying, lifting, bending, sitting, standing, squatting, and reach over head   PARTICIPATION LIMITATIONS: meal prep, cleaning, laundry, driving, shopping, community activity, and yard work    PERSONAL FACTORS: Age, Behavior pattern, Past/current experiences, Time since onset of injury/illness/exacerbation, and 3+ comorbidities: arthritis, DM2, HTN, Obesity,   are also affecting patient's functional outcome.    REHAB POTENTIAL: Good   CLINICAL DECISION MAKING: Stable/uncomplicated   EVALUATION COMPLEXITY: Low     GOALS: Goals reviewed with patient? Yes   SHORT TERM GOALS:  Target date: 12/11/2022       Pt will be independent with HEP in order to improve strength of BLE and ROM of the neck in order to return to PLOF. Baseline: provided on neck visit Goal status: INITIAL       LONG TERM GOALS: Target date: 01/22/2023       Patient will demonstrate improved function as evidenced by a score of 68 on FOTO measure for full participation in activities at home and in the community. Baseline: 65. 10/23/22: 52. 5/15 56 for neck and CVA Goal status: IN PROGRESS   2.  Pt to improve NDI by 5 points or 10% in order to demonstrate a reduction in overall disability caused by neck pain at this time. Baseline: 15/50 or 30% disability 4/11 27/50  Goal status: NOT MET   3.  Pt to demonstrate improved cervical rotation to 60 deg bilaterally in order to perform tasks around the house that require turning of the head. Baseline: R/L Cervical Rotation: 49/30. 4/424 R:53. L:42  5/14: R45deg. L50deg  Goal status: IN PROGRESS   4.  Pt to report 0/10 pain in the neck following light household chores at home in order to demonstrate an overall reduction in pain and an improvement in QoL. Baseline: 4/10 at rest 0/10 in neck Goal status: MET   5.  Pt will improve gait speed to <1.71m/s to indicate improved community mobility and reduced fall risk  Baseline: 0.59m/s  Goal status: revised    6. Pt will increase 6 min walk test >1068ft  Baseline: 97ft with SPC Goal status: in progress.    PLAN:   PT FREQUENCY: 1-2x/week   PT DURATION: 12 weeks   PLANNED INTERVENTIONS: Therapeutic  exercises, Therapeutic activity, Neuromuscular re-education, Balance training, Gait training, Patient/Family education, Self Care, Joint mobilization, Joint manipulation, Stair training, Vestibular training, Canalith repositioning, Visual/preceptual remediation/compensation, DME instructions, Dry Needling, Cognitive remediation, Electrical stimulation, Spinal manipulation, Spinal mobilization, Cryotherapy, Moist heat, Traction, Ultrasound, Manual therapy, and Re-evaluation       PLAN FOR NEXT SESSION:      Increased strengthening and dynamic balance training.  Re-assess HEP     Grier Rocher PT, DPT  Physical Therapist - [facility]  12:17 PM 12/04/22

## 2023-01-01 ENCOUNTER — Ambulatory Visit: Payer: Medicare HMO | Admitting: Physical Therapy

## 2023-01-01 ENCOUNTER — Encounter: Payer: Self-pay | Admitting: Occupational Therapy

## 2023-01-01 ENCOUNTER — Ambulatory Visit: Payer: Medicare HMO | Admitting: Occupational Therapy

## 2023-01-01 DIAGNOSIS — R278 Other lack of coordination: Secondary | ICD-10-CM

## 2023-01-01 DIAGNOSIS — R2681 Unsteadiness on feet: Secondary | ICD-10-CM

## 2023-01-01 DIAGNOSIS — R262 Difficulty in walking, not elsewhere classified: Secondary | ICD-10-CM

## 2023-01-01 DIAGNOSIS — M6281 Muscle weakness (generalized): Secondary | ICD-10-CM

## 2023-01-01 DIAGNOSIS — M25552 Pain in left hip: Secondary | ICD-10-CM

## 2023-01-01 DIAGNOSIS — M542 Cervicalgia: Secondary | ICD-10-CM

## 2023-01-01 NOTE — Therapy (Signed)
OUTPATIENT PHYSICAL THERAPY  TREATMENT     Patient Name: Alyssa Mayo MRN: [id number] DOB:12-31-1945, 77 y.o., female Today's Date: 12/04/2022   END OF SESSION:    PT End of Session - 12/30/22 1108     Visit Number 26    Number of Visits 36    Date for PT Re-Evaluation 01/22/23    Authorization Type [employer]    Progress Note Due on Visit 30    Equipment Utilized During Treatment Gait belt    Activity Tolerance Patient tolerated treatment well;No increased pain    Behavior During Therapy [facility] for tasks assessed/performed                                      Past Medical History:  Diagnosis Date   Abnormal levels of other serum enzymes 07/17/2015   Arthritis     Constipation 07/11/2015   COVID-19 03/06/2021   Diabetes mellitus without complication (HCC)     Elevated liver enzymes     Fatty liver     Generalized abdominal pain 07/11/2015   Hyperlipidemia     Hypertension     Lifelong obesity     Macular degeneration     Morbid obesity due to excess calories (HCC) 07/11/2015   Other fatigue 07/11/2015   Sleep apnea     Spinal headache           Past Surgical History:  Procedure Laterality Date   ABDOMINAL HYSTERECTOMY       APPENDECTOMY       CATARACT EXTRACTION       CERVICAL LAMINECTOMY   07/21/1985   CESAREAN SECTION       COLONOSCOPY   07/21/2012    diverticulosis, [facility] Dr. [name]    COLONOSCOPY WITH PROPOFOL N/A 02/04/2019    Procedure: COLONOSCOPY WITH PROPOFOL;  Surgeon: [name], MD;  Location: [facility] ENDOSCOPY;  Service: Endoscopy;  Laterality: N/A;   COLONOSCOPY WITH PROPOFOL N/A 03/18/2021    Procedure: COLONOSCOPY WITH PROPOFOL;  Surgeon: [name], MD;  Location: [facility] ENDOSCOPY;  Service: Endoscopy;  Laterality: N/A;  DM   DIAGNOSTIC LAPAROSCOPY       ESOPHAGOGASTRODUODENOSCOPY (EGD) WITH PROPOFOL N/A 02/04/2019    Procedure: ESOPHAGOGASTRODUODENOSCOPY (EGD) WITH PROPOFOL;  Surgeon: [name], MD;   Location: [facility] ENDOSCOPY;  Service: Endoscopy;  Laterality: N/A;   ESOPHAGOGASTRODUODENOSCOPY (EGD) WITH PROPOFOL N/A 03/18/2021    Procedure: ESOPHAGOGASTRODUODENOSCOPY (EGD) WITH PROPOFOL;  Surgeon: [name], MD;  Location: [facility] ENDOSCOPY;  Service: Endoscopy;  Laterality: N/A;   LOOP RECORDER INSERTION N/A 08/05/2021    Procedure: LOOP RECORDER INSERTION;  Surgeon: [name], MD;  Location: [facility] INVASIVE CV LAB;  Service: Cardiovascular;  Laterality: N/A;   SPINE SURGERY       TUBAL LIGATION            Patient Active Problem List    Diagnosis Date Noted   UTI (urinary tract infection) 09/01/2022   COVID-19 virus infection 09/01/2022   Chronic diastolic CHF (congestive heart failure) (HCC) 09/01/2022   Chronic kidney disease, stage 3a (HCC) 09/01/2022   Obesity (BMI 30-39.9) 09/01/2022   Paroxysmal atrial fibrillation (HCC) 08/08/2022   Hypercoagulable state due to paroxysmal atrial fibrillation (HCC) 08/08/2022   Cerebral edema (HCC) 08/07/2021   OSA (obstructive sleep apnea) 08/07/2021   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   CVA (cerebral vascular accident) (HCC)  08/01/2021   GERD (gastroesophageal reflux disease) 07/20/2019   Advanced care planning/counseling discussion 12/17/2016   Skin lesions, generalized 11/13/2016   Chronic right hip pain 06/17/2016   Vitamin D deficiency 09/26/2015   Diastasis recti 08/08/2015   Elevated serum GGT level 07/24/2015   Essential hypertension 07/17/2015   Fatty liver 07/17/2015   Elevated alkaline phosphatase level 07/17/2015   Elevated serum glutamic pyruvic transaminase (SGPT) level 07/17/2015   Abnormal finding on EKG 07/11/2015   Morbid obesity (HCC) 07/11/2015   Colon, diverticulosis 07/11/2015   Abdominal wall hernia 07/11/2015   DM (diabetes mellitus), type 2 with neurological complications (HCC)     Hyperlipidemia        PCP: [name], MD   REFERRING PROVIDER: [name], MD & Edilia Bo, [place]   REFERRING DIAG: [phone] (ICD-10-CM) - Cerebral infarction due to unspecified occlusion or stenosis of unspecified precerebral arteries, Cervicalgia with degenerative disc   THERAPY DIAG:  Muscle weakness (generalized)   Other lack of coordination   Difficulty in walking, not elsewhere classified   Cervical pain   Pain in left hip   Painful cervical ROM   Apraxia   Unsteadiness on feet   Rationale for Evaluation and Treatment: Rehabilitation   ONSET DATE: 09/03/22   SUBJECTIVE:                                                                                                                                                                                                          SUBJECTIVE STATEMENT:    Pt states that she is doing well this AM. No pain was able to go to graduation party for church member. No falls or trips reported. Pt states that she feel like she is continuing to fatigue quickly with ADL and community mobility       PERTINENT HISTORY:    Per chart and confirmed by pt: Pt is a 77 yo female with RMCA CVA infarction with hemorrhagic transformation 08/01/2021, with inpatient rehab 08/07/2021-08/22/2021 followed by [facility] therapy until June. Pt now currently being seen for outpatient OT and ST services. Pt reports limitations in stamina persist. Other PMH is significant for HTN, HLD, macular degeneration, DM, obesity, sleep apnea, arthritis, chronic R hip pain, vitamin D deficiency, diastasis recti, abdominal wall hernia, hx of abdominal hysterectomy, appendectomy, spine surgery (years ago).    PAIN:  Are you having pain? Yes: NPRS scale: 2/10 Pain location:  L hip/L knee  Pain description: dull ache Aggravating factors: sitting Relieving factors: laying down   PRECAUTIONS: None  WEIGHT BEARING RESTRICTIONS: No   FALLS:  Has patient fallen in last 6 months? Yes. Number of falls 2   LIVING ENVIRONMENT: Lives with: lives with their son Lives in:  House/apartment Stairs: Yes: Internal: 13 steps; doesn't go up to the second floor and External: 4 steps; can reach both Has following equipment at home: Single point cane and Walker - 2 wheeled   OCCUPATION: Retired   PLOF: Independent with basic ADLs   PATIENT GOALS: "Get stronger. And reduce fall risk. Reduce reliance on cane"   NEXT MD VISIT: end of May.    OBJECTIVE:    DIAGNOSTIC FINDINGS:      CT HEAD WITHOUT CONTRAST  CT CERVICAL SPINE WITHOUT CONTRAST IMPRESSION: 1. No acute intracranial pathology. 2. Expected interval evolution of encephalomalacia of the right frontal lobe, following intraparenchymal hemorrhage seen acutely on examination dated 08/03/2021. 3. No fracture or static subluxation of the cervical spine. 4. Generally mild disc space height loss and osteophytosis throughout the cervical spine, with partial ankylosis of C5-C6 and a large left eccentric posterior bridging osteophyte at this level. MRI may be used to further evaluate cervical disc and neural foraminal pathology if indicated by neurologically localizing signs and symptoms.   EXAM: xRay PELVIS - 1-2 VIEW  IMPRESSION: 1. No convincing evidence of acute fracture or diastasis of the pelvis. 2. Well corticated osseous focus beneath the right inferior pubic ramus may reflect sequela of avulsion injury or chronic tendinopathy, suggest correlation with direct tenderness to palpation.     PATIENT SURVEYS:  NDI 15/50 = 30% disability FOTO 65/68   10/23/2022 : 52/68  10/23/2022 Foto CVA 57/59   COGNITION: Overall cognitive status: History of cognitive impairments - at baseline   SENSATION: WFL   POSTURE: rounded shoulders and forward head   PALPATION:  tender to touch on L ischial tuberosity.                CERVICAL ROM:    Active ROM A/PROM (deg) eval 3/21 4/4  Flexion 39 25 39  Extension 10 24 25   Right lateral flexion 7 15 15   Left lateral flexion 6 20 14   Right rotation 49 53  41  Left rotation 30 42 35   (Blank rows = not tested)   UPPER EXTREMITY ROM:   Active ROM Right eval Left eval Right 4/4 Left 4/4  Shoulder flexion 128 81 149 22  Shoulder abduction 160 68  158  38   (Blank rows = not tested)     UPPER EXTREMITY MMT:   Grossly decreased (3+/5) in the L UE due to pain and lack of ROM     CERVICAL SPECIAL TESTS:  Spurling's test: Negative and Sharp pursor's test: Negative           LOWER EXTREMITY MMT:     MMT Right 3/21 Left 3/21  Hip flexion 4+ 4-  Hip extension 4+ 4-  Hip abduction 4+ 4  Hip adduction 4+ 4  Hip internal rotation      Hip external rotation      Knee flexion 4+ 4-  Knee extension 4+ 4  Ankle dorsiflexion 4+ 4  Ankle plantarflexion      Ankle inversion      Ankle eversion       (Blank rows = not tested)   LOWER EXTREMITY ROM:      Passive  Right 3/21 Left 3/21  Hip flexion 120 105  Hip extension      Hip abduction  Hip adduction      Hip internal rotation      Hip external rotation      Knee flexion [facility] WFL  Knee extension (in Hip flexion 90deg) 143 120  Ankle dorsiflexion      Ankle plantarflexion      Ankle inversion      Ankle eversion       (Blank rows = not tested)      LOWER EXTREMITY SPECIAL TESTS:  Hip special tests: Luisa Hart (FABER) test: positive , Hip scouring test: negative, and distraction: negative     FUNCTIONAL TESTS:  6 minute walk test: 955ft with SPC 10 meter walk test: 0.87m/s Not performed at this time.   TODAY'S TREATMENT: DATE: 12/04/22  Pt instructed in therex:  3# ankle weights:  Foot tap forward 2 x 10 bil/lateral 2 x 10 bil UE support on SPC  Step up 2 x 3 bil UE support on [facility] Sit<>stand with ball between thighs 2x 10 bil   Sti<>stand with green tband 2x 10 bil  Hip abduction GTB x 12  Isometric Hip adduction x 12 with 3 sec hold  Hip flexion GTB x 12 bil  HS curl x 10 bil GTB  LAQ with 4 sec hold x 12 bil.   Pt reports no increase in pain  throughout session. Multiple mild lateral LOB to the L with foot taps and step ups, pt able to correct without increased assist from PT. Cues for anterior weight shift and accessory muscle activation with resisted sit<>stand. HEP provided for BLE strength as listed below      PATIENT EDUCATION:  Education details: Pt educated throughout session about proper posture and technique with exercises. Improved exercise technique, movement at target joints, use of target muscles after min to mod verbal, visual, tactile cues.   Person educated: Patient and   Education method: Explanation Education comprehension: verbalized understanding   HOME EXERCISE PROGRAM:   Access Code: [email or web address] URL: [email or web address] Date: 01/01/2023 Prepared by: Grier Rocher  Exercises - Sit to Stand with Resistance Around Legs  - 1 x daily - 5 x weekly - 3 sets - 10 reps - Seated Hip Adduction Isometrics with Ball  - 1 x daily - 7 x weekly - 3 sets - 10 reps - Standing March with Counter Support  - 1 x daily - 5 x weekly - 3 sets - 10 reps - Seated Hip Abduction with Resistance  - 1 x daily - 5 x weekly - 3 sets - 10 reps - 3 hold  Access Code: [email or web address] URL: [email or web address] Date: 09/18/2022 Prepared by: Tomasa Hose   Exercises - Seated Upper Trapezius Stretch  - 1 x daily - 7 x weekly - 3 sets - 10 reps - Seated Levator Scapulae Stretch  - 1 x daily - 7 x weekly - 3 sets - 10 reps - Seated Scapular Retraction  - 1 x daily - 7 x weekly - 3 sets - 10 reps     ASSESSMENT:   CLINICAL IMPRESSION: Patient presents with good motivation for completion of physical therapy activities. PT treatment focused on BLE strength and endurance. Pt noted to have mild instability when standing on the LLE, but abl eto utilize ankle strategy to correct lateral LOB, min cues for adequate anterior weight shft and accessory muscle activation with resisted transfer training. PT provided HEP for  BLE.  Pt will continue to benefit from skilled physical therapy intervention to address impairments, improve QOL, and attain therapy goals.  OBJECTIVE IMPAIRMENTS: decreased activity tolerance, decreased cognition, decreased ROM, decreased strength, decreased safety awareness, hypomobility, and pain.    ACTIVITY LIMITATIONS: carrying, lifting, bending, sitting, standing, squatting, and reach over head   PARTICIPATION LIMITATIONS: meal prep, cleaning, laundry, driving, shopping, community activity, and yard work   PERSONAL FACTORS: Age, Behavior pattern, Past/current experiences, Time since onset of injury/illness/exacerbation, and 3+ comorbidities: arthritis, DM2, HTN, Obesity,   are also affecting patient's functional outcome.    REHAB POTENTIAL: Good   CLINICAL DECISION MAKING: Stable/uncomplicated   EVALUATION COMPLEXITY: Low     GOALS: Goals reviewed with patient? Yes   SHORT TERM GOALS: Target date: 12/11/2022       Pt will be independent with HEP in order to improve strength of BLE and ROM of the neck in order to return to PLOF. Baseline: provided on neck visit Goal status: INITIAL       LONG TERM GOALS: Target date: 01/22/2023       Patient will demonstrate improved function as evidenced by a score of 68 on FOTO measure for full participation in activities at home and in the community. Baseline: 65. 10/23/22: 52. 5/15 56 for neck and CVA Goal status: IN PROGRESS   2.  Pt to improve NDI by 5 points or 10% in order to demonstrate a reduction in overall disability caused by neck pain at this time. Baseline: 15/50 or 30% disability 4/11 27/50  Goal status: NOT MET   3.  Pt to demonstrate improved cervical rotation to 60 deg bilaterally in order to perform tasks around the house that require turning of the head. Baseline: R/L Cervical Rotation: 49/30. 4/424 R:53. L:42  5/14: R45deg. L50deg  Goal status: IN PROGRESS   4.  Pt to report 0/10 pain in the neck  following light household chores at home in order to demonstrate an overall reduction in pain and an improvement in QoL. Baseline: 4/10 at rest 0/10 in neck Goal status: MET   5.  Pt will improve gait speed to <1.68m/s to indicate improved community mobility and reduced fall risk  Baseline: 0.53m/s  Goal status: revised    6. Pt will increase 6 min walk test >1083ft  Baseline: 966ft with SPC Goal status: in progress.    PLAN:   PT FREQUENCY: 1-2x/week   PT DURATION: 12 weeks   PLANNED INTERVENTIONS: Therapeutic exercises, Therapeutic activity, Neuromuscular re-education, Balance training, Gait training, Patient/Family education, Self Care, Joint mobilization, Joint manipulation, Stair training, Vestibular training, Canalith repositioning, Visual/preceptual remediation/compensation, DME instructions, Dry Needling, Cognitive remediation, Electrical stimulation, Spinal manipulation, Spinal mobilization, Cryotherapy, Moist heat, Traction, Ultrasound, Manual therapy, and Re-evaluation       PLAN FOR NEXT SESSION:      Increased strengthening and dynamic balance training.  Variable gait training  Blaze pods.    Grier Rocher PT, DPT  Physical Therapist - [facility]  12:17 PM 12/04/22

## 2023-01-01 NOTE — Therapy (Signed)
Occupational Therapy Neuro Treatment Note   Patient Name: Alyssa Mayo MRN: [id number] DOB:06/02/1946, 77 y.o., female Today's Date: 03/14/2022   PCP: [name], MD REFERRING PROVIDER: [name], NP     OT End of Session - 01/01/23 1054     Visit Number 26    Number of Visits 48    Date for OT Re-Evaluation 02/24/23    Authorization Type Progress reporting period starting 09/16/2022    OT Start Time 1015    OT Stop Time 1100    OT Time Calculation (min) 45 min    Activity Tolerance Patient tolerated treatment well    Behavior During Therapy [facility] for tasks assessed/performed                                    Past Medical History:  Diagnosis Date   Abnormal levels of other serum enzymes 07/17/2015   Arthritis     Constipation 07/11/2015   COVID-19 03/06/2021   Diabetes mellitus without complication (HCC)     Elevated liver enzymes     Fatty liver     Generalized abdominal pain 07/11/2015   Hyperlipidemia     Hypertension     Lifelong obesity     Macular degeneration     Morbid obesity due to excess calories (HCC) 07/11/2015   Other fatigue 07/11/2015   Sleep apnea     Spinal headache           Past Surgical History:  Procedure Laterality Date   ABDOMINAL HYSTERECTOMY       APPENDECTOMY       CATARACT EXTRACTION       CERVICAL LAMINECTOMY   07/21/1985   CESAREAN SECTION       COLONOSCOPY   07/21/2012    diverticulosis, [facility] Dr. [name]    COLONOSCOPY WITH PROPOFOL N/A 02/04/2019    Procedure: COLONOSCOPY WITH PROPOFOL;  Surgeon: [name], MD;  Location: [facility] ENDOSCOPY;  Service: Endoscopy;  Laterality: N/A;   COLONOSCOPY WITH PROPOFOL N/A 03/18/2021    Procedure: COLONOSCOPY WITH PROPOFOL;  Surgeon: [name], MD;  Location: [facility] ENDOSCOPY;  Service: Endoscopy;  Laterality: N/A;  DM   DIAGNOSTIC LAPAROSCOPY       ESOPHAGOGASTRODUODENOSCOPY (EGD) WITH PROPOFOL N/A 02/04/2019    Procedure: ESOPHAGOGASTRODUODENOSCOPY (EGD)  WITH PROPOFOL;  Surgeon: [name], MD;  Location: [facility] ENDOSCOPY;  Service: Endoscopy;  Laterality: N/A;   ESOPHAGOGASTRODUODENOSCOPY (EGD) WITH PROPOFOL N/A 03/18/2021    Procedure: ESOPHAGOGASTRODUODENOSCOPY (EGD) WITH PROPOFOL;  Surgeon: [name], MD;  Location: [facility] ENDOSCOPY;  Service: Endoscopy;  Laterality: N/A;   LOOP RECORDER INSERTION N/A 08/05/2021    Procedure: LOOP RECORDER INSERTION;  Surgeon: [name], MD;  Location: [facility] INVASIVE CV LAB;  Service: Cardiovascular;  Laterality: N/A;   SPINE SURGERY       TUBAL LIGATION            Patient Active Problem List    Diagnosis Date Noted   Paroxysmal atrial fibrillation (HCC) 08/08/2022   Hypercoagulable state due to paroxysmal atrial fibrillation (HCC) 08/08/2022   Cerebral edema (HCC) 08/07/2021   OSA (obstructive sleep apnea) 08/07/2021   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   CVA (cerebral vascular accident) (HCC) 08/01/2021   GERD (gastroesophageal reflux disease) 07/20/2019   Advanced care planning/counseling discussion 12/17/2016   Skin lesions, generalized 11/13/2016   Chronic right hip pain 06/17/2016  Vitamin D deficiency 09/26/2015   Diastasis recti 08/08/2015   Elevated serum GGT level 07/24/2015   Essential hypertension 07/17/2015   Fatty liver 07/17/2015   Elevated alkaline phosphatase level 07/17/2015   Elevated serum glutamic pyruvic transaminase (SGPT) level 07/17/2015   Abnormal finding on EKG 07/11/2015   Morbid obesity (HCC) 07/11/2015   Colon, diverticulosis 07/11/2015   Abdominal wall hernia 07/11/2015   DM (diabetes mellitus), type 2 with neurological complications (HCC)     Hyperlipidemia        ONSET DATE: 08/01/2021   REFERRING DIAG: CVA   THERAPY DIAG:  Muscle weakness (generalized)   Rationale for Evaluation and Treatment Rehabilitation   SUBJECTIVE:    SUBJECTIVE STATEMENT:    Pt.  reports having a graduation party this weekend.   Pt  accompanied by: self   PERTINENT HISTORY:  Pt. is a 77 y.o. female who had a unexpectedly hospitalized from 2/12-2/14/2024 for COVID-19, and a UTI.  Pt. 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 Pt. attended inpatient rehab from 08/07/2021-08/22/2021. Pt. received Home health therapy services this past spring.  Pt. has recently had an assessment through driver rehabilitation services at [facility] with recommendations for referrals for  outpatient OT/PT, and ST services. Pt. PMHx includes: HTN, Hyperlipidemia, Macular degeneration, DM, and Obesity.   PRECAUTIONS: None   WEIGHT BEARING RESTRICTIONS No   PAIN:  Are you having pain?  0/10 pain   FALLS: Has patient fallen in last 6 months? Yes.   LIVING ENVIRONMENT: Lives with: lives with their family  Son  [name] Lives in: House/apartment Main living are on one floor Stairs:  2 steps to enter Has following equipment at home: Single point cane, Wheelchair (manual), shower chair, Grab bars, bed rail, and rubber mat.   PLOF: Independent   PATIENT GOALS: To be able to Drive, and cook again   OBJECTIVE:    HAND DOMINANCE: Right    ADLs:  Transfers/ambulation related to ADLs: Independent Eating: Independent Grooming: [employer] Pt. reports being unable to use her left hand to perform haircare.  UB Dressing: Independent pullover shirt, independent now with fastening a bra LB Dressing: Independent donning pants socks, and slide on shoes Toileting: Independent Bathing: Independent Tub Shower transfers: Walk-in shower Independent now     IADLs: Shopping: Needs to be accompanied to the grocery store. Light housekeeping: Do  Meal Prep:  Is able to perform light meal prep Community mobility: Relies on son, and friend Medication management: Son sets up Mining engineer management:TBD Handwriting: TBD   MOBILITY STATUS: Hx of falls out of bed     ACTIVITY TOLERANCE: Activity tolerance:   10-20 min. Before rest break   FUNCTIONAL OUTCOME MEASURES: FOTO: 61  TR score: 62  10/09/2022: FOTO: 49   UPPER EXTREMITY ROM      Active ROM Right eval Left eval Left  10/09/2022 Left 10/23/22 Left 12/02/2022  Shoulder flexion WFL 54(74) scaption 0(55) 26(80) 32(82)  Shoulder abduction WFL 58(75) 32(54) 32(50) 45(60)  Shoulder adduction         Shoulder extension         Shoulder internal rotation         Shoulder external rotation         Elbow flexion [facility] WFL 145 145 145  Elbow extension [facility] WFL 10(0) 10(0) WFL  Wrist flexion WFL TBD [facility] WFL  Wrist extension WFL TBD [facility] WFL  Wrist ulnar deviation  Wrist radial deviation         Wrist pronation         Wrist supination         (Blank rows = not tested)     UPPER EXTREMITY MMT:      MMT Right eval Left eval Left 10/09/2022 Left 10/23/2022 Left 12/02/2022  Shoulder flexion 4+/5 2+/5 1/5 2-/5 2-/5  Shoulder abduction 4+/5 2+/5 2-/5 2-/5 2-/5  Shoulder adduction         Shoulder extension         Shoulder internal rotation         Shoulder external rotation         Middle trapezius         Lower trapezius         Elbow flexion 4+/5 3+/5 3/5 3/5 3+/5  Elbow extension 4+/5 3+/5 3/5 3/5 3+/5  Wrist flexion 4+/5 TBD 3/5 3/5 3+/5  Wrist extension 4+/5 TBD 3/5 3/5 3+/5  Wrist ulnar deviation         Wrist radial deviation         Wrist pronation         Wrist supination         (Blank rows = not tested)   HAND FUNCTION: Grip strength: Right: 33 lbs; Left: 11 lbs, Lateral pinch: Right: 17 lbs, Left: 2 lbs, and 3 point pinch: Right: 17 lbs, Left: 2 lbs  10/09/2022: Grip strength: Right: 33 lbs; Left: 10 lbs, Lateral pinch: Right: 17 lbs, Left: 1 lbs, and 3 point pinch: Right: 17 lbs, Left: 1 lbs   10/23/2022: Grip strength: Right: 33 lbs; Left: 16 lbs, Lateral pinch: Right: 17 lbs, Left: 1 lbs, and 3 point pinch: Right: 17 lbs, Left: 1 lbs  12/02/2022: Grip strength: Right: 33 lbs; Left: 17 lbs, Lateral  pinch: Right: 17 lbs, Left: 7 lbs, and 3 point pinch: Right: 17 lbs, Left: 3 lbs     COORDINATION:   9 Hole Peg test: Right: 27 sec.  sec; Left: 52 sec.  10/09/2022: 9 Hole Peg test: Right: 27 sec.  sec; Left: 1 min. & 35 sec.  SENSATION: Light touch: WFL Proprioception: WFL   COGNITION: Overall cognitive status: Within functional limits for tasks assessed   VISION: Subjective report: Glasses. Just received a new prescription to increase bifocal strength Baseline vision: Macular Degeneration Visual history: macular degeneration   VISION ASSESSMENT: To be further assessed in functional context  Left sided Awareness      PERCEPTION: Limited left sided awareness     TODAY'S TREATMENT:   Therapeutic Exercise:               Pt. Seen for PROM in all joint ranges of the left shoulder prior to active movement patterns to decrease pain and prepare joints for reaching patterns.   Pt. worked on [employer], and reciprocal motion using the UBE while seated for 8 min. with no resistance initially, and the resistance gradually increasing as patient was able to tolerate.     Manual therapy:  Manual therapy techniques for soft tissue massage to the scapular musculature 2/2 tightness, scapular elevation, retraction, upwards rotation.   Manual therapy was performed independent of, and in preparation for therex.      Neuromuscular re-education:  Pt seen for manipulation of round ball pegs to place and remove from grid with promotion of reaching patterns to encourage shoulder flexion and forward reach.  Cues at time for prehension patterns for tip  to tip between index and thumb and a 3 point prehension to include middle finger with encouragement for  isolated finger movements.  Pt also seen for unknotting of medium nylon rope with cues for bilateral hand use.  Difficulty with tighter knots and occasionally required assist to loosen for successful unknotting.      PATIENT  EDUCATION: Education details: Compensatory strategies during typing. Person educated: Patient Education method: verbal cues Education comprehension: verbalized understanding and pt does not plan to cook unless son is present.     HOME EXERCISE PROGRAM:  Reviewed home activities to enhance left hand digit extension  during typing skills.   GOALS: Goals reviewed with patient? Yes     SHORT TERM GOALS: Target date: 01/13/2023       Pt. Will improve FOTO score by 2 points to reflect improved pt. perceived functional performance Baseline: 12/01/2022: FOTO: 51 10/23/2022: FOTO 49 10/09/2022: FOTO 49, FOTO: 61 TR score: 62  Goal status: INITIAL   LONG TERM GOALS: Target date:02/24/2023  Pt. Will improve left shoulder ROM by 10 degrees to be able able to efficiently apply deodorant Baseline: 12/02/2022: Shoulder flexion: 32(82), Abduction: 45(60)  10/23/2022: Shoulder flexion: 26(80), Abduction: 32(50) 10/09/2022: flexion: 0(55), Abduction: 32(54) Goal status: Improving, but very limited.   2.  Pt. will be able to independently hold and use a blow dryer, and brush for hair care. Baseline: 12/02/2022: Pt. is unable to actively elevate he left shoulder in order to blow dry her hair. 10/23/2022: Pt. continues to be unable to hold a blow dryer. Eval: Pt. Is unable to sustain her BUEs in elevation, and use a blow dryer, and brush. 10/09/2022: Pt. Is unable to hold a blow dryer. Eval: Pt. Is unable to sustain her BUEs in elevation, and use a blow dryer, and brush. Goal status: Ongoing   3.  Pt. Will improve left lateral pinch strength by 3# to be able to independently cut meat. Baseline: 12/02/2022: Left pinch: 7# Pt. has difficulty cutting meat. 10/23/2022: Pt. Continues to present with difficulty cutting meat. 10/09/2022: 1#  Eval: 2#. Pt. has difficulty stabilizing utensil, and food while cutting food. Goal status: Ongoing   4.  Pt. Will improve Left hand [facility] skills to be able to be able to independently,  and efficiently manipulate small objects during ADL tasks. Baseline 12/02/2022: TBD  10/23/2022: Pt. Continues to present with difficulty manipulating small objects. 10/09/2022: Left: 1 min & 35 sec.: Eval: Left: 52 sec. Right: 27 sec. Goal status: Ongoing     6.: Pt. will improve with left grip strength to be able to hold and pull up covers. Baseline: 12/02/2022: 17# 10/23/2022: Left: 16# 10/09/2022: Left: 10# Eval:  Left grip strength 11#. Pt. has difficulty holding a full drink  securely with the left hand.             Goal status: Ongoing   7. Pt. Will improve typing speed, and accuracy in preparation for efficiently typing simple email message. Baseline: 12/02/2022:  Pt. continues to present with limited speed, and accuracy with typing.10/23/2022: Pt. continues to present with limited speed, and accuracy with typing. 10/09/2022: Pt. presents with difficulty typing. Pt. presents with difficulty typing an email. Typing speed, and accuracy TBD             Goal status: INITIAL  CLINICAL IMPRESSION:  Pt continue to improve with hand function and strength, responds well to verbal and tactile cues. She continues to have some stiffness in her shoulder and mild pain at times with attempts to use for functional tasks.  She responds well to manual techniques and use of reciprocal arm movement to loosen up shoulder area in the clinic.  She continues to work on stretching at home prior to activities.  Pt. continues to benefit from OT services to work towards improving Left UE functioning in order to maximize engagement in, and overall independence with ADLs, and IADL tasks.    PERFORMANCE DEFICITS in functional skills including ADLs, IADLs, coordination, dexterity, sensation, ROM, strength, FMC, decreased knowledge of use of DME, vision, and UE functional use, cognitive skills including attention, and psychosocial skills including coping strategies, environmental adaptation,  habits, and routines and behaviors.    IMPAIRMENTS are limiting patient from ADLs, IADLs, and social participation.    COMORBIDITIES may have co-morbidities  that affects occupational performance. Patient will benefit from skilled OT to address above impairments and improve overall function.   MODIFICATION OR ASSISTANCE TO COMPLETE EVALUATION: Min-Moderate modification of tasks or assist with assess necessary to complete an evaluation.   OT OCCUPATIONAL PROFILE AND HISTORY: Detailed assessment: Review of records and additional review of physical, cognitive, psychosocial history related to current functional performance.   CLINICAL DECISION MAKING: Moderate - several treatment options, min-mod task modification necessary   REHAB POTENTIAL: Good   EVALUATION COMPLEXITY: Moderate      PLAN: OT FREQUENCY: 2x/week   OT DURATION: 12 weeks   PLANNED INTERVENTIONS: self care/ADL training, therapeutic exercise, therapeutic activity, neuromuscular re-education, manual therapy, passive range of motion, and paraffin   RECOMMENDED OTHER SERVICES: ST, and PT   CONSULTED AND AGREED WITH PLAN OF CARE: Patient   PLAN FOR NEXT SESSION:  L hand strengthening and coordination exercises    Mayank Teuscher T Juriel Cid, OTR/L, CLT  01/01/23, 10:55 AM

## 2023-01-01 NOTE — Therapy (Signed)
Occupational Therapy Neuro Treatment Note   Patient Name: Alyssa Mayo MRN: [id number] DOB:07-02-1946, 77 y.o., female Today's Date: 03/14/2022   PCP: Alyssa Mina, MD REFERRING PROVIDER: [name], NP     OT End of Session - 01/01/23 1347     Visit Number 27    Number of Visits 48    Date for OT Re-Evaluation 02/24/23    OT Start Time 1015    OT Stop Time 1100    OT Time Calculation (min) 45 min    Activity Tolerance Patient tolerated treatment well    Behavior During Therapy [facility] for tasks assessed/performed                                    Past Medical History:  Diagnosis Date   Abnormal levels of other serum enzymes 07/17/2015   Arthritis     Constipation 07/11/2015   COVID-19 03/06/2021   Diabetes mellitus without complication (HCC)     Elevated liver enzymes     Fatty liver     Generalized abdominal pain 07/11/2015   Hyperlipidemia     Hypertension     Lifelong obesity     Macular degeneration     Morbid obesity due to excess calories (HCC) 07/11/2015   Other fatigue 07/11/2015   Sleep apnea     Spinal headache           Past Surgical History:  Procedure Laterality Date   ABDOMINAL HYSTERECTOMY       APPENDECTOMY       CATARACT EXTRACTION       CERVICAL LAMINECTOMY   07/21/1985   CESAREAN SECTION       COLONOSCOPY   07/21/2012    diverticulosis, [facility] Alyssa Mayo    COLONOSCOPY WITH PROPOFOL N/A 02/04/2019    Procedure: COLONOSCOPY WITH PROPOFOL;  Surgeon: Alyssa Deem, MD;  Location: [facility] ENDOSCOPY;  Service: Endoscopy;  Laterality: N/A;   COLONOSCOPY WITH PROPOFOL N/A 03/18/2021    Procedure: COLONOSCOPY WITH PROPOFOL;  Surgeon: Alyssa Bill, MD;  Location: [facility] ENDOSCOPY;  Service: Endoscopy;  Laterality: N/A;  DM   DIAGNOSTIC LAPAROSCOPY       ESOPHAGOGASTRODUODENOSCOPY (EGD) WITH PROPOFOL N/A 02/04/2019    Procedure: ESOPHAGOGASTRODUODENOSCOPY (EGD) WITH PROPOFOL;  Surgeon: Alyssa Deem, MD;  Location: [facility]  ENDOSCOPY;  Service: Endoscopy;  Laterality: N/A;   ESOPHAGOGASTRODUODENOSCOPY (EGD) WITH PROPOFOL N/A 03/18/2021    Procedure: ESOPHAGOGASTRODUODENOSCOPY (EGD) WITH PROPOFOL;  Surgeon: Alyssa Bill, MD;  Location: [facility] ENDOSCOPY;  Service: Endoscopy;  Laterality: N/A;   LOOP RECORDER INSERTION N/A 08/05/2021    Procedure: LOOP RECORDER INSERTION;  Surgeon: Alyssa Maw, MD;  Location: [facility] INVASIVE CV LAB;  Service: Cardiovascular;  Laterality: N/A;   SPINE SURGERY       TUBAL LIGATION            Patient Active Problem List    Diagnosis Date Noted   Paroxysmal atrial fibrillation (HCC) 08/08/2022   Hypercoagulable state due to paroxysmal atrial fibrillation (HCC) 08/08/2022   Cerebral edema (HCC) 08/07/2021   OSA (obstructive sleep apnea) 08/07/2021   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   CVA (cerebral vascular accident) (HCC) 08/01/2021   GERD (gastroesophageal reflux disease) 07/20/2019   Advanced care planning/counseling discussion 12/17/2016   Skin lesions, generalized 11/13/2016   Chronic right hip pain 06/17/2016   Vitamin D deficiency 09/26/2015   Diastasis recti 08/08/2015  Elevated serum GGT level 07/24/2015   Essential hypertension 07/17/2015   Fatty liver 07/17/2015   Elevated alkaline phosphatase level 07/17/2015   Elevated serum glutamic pyruvic transaminase (SGPT) level 07/17/2015   Abnormal finding on EKG 07/11/2015   Morbid obesity (HCC) 07/11/2015   Colon, diverticulosis 07/11/2015   Abdominal wall hernia 07/11/2015   DM (diabetes mellitus), type 2 with neurological complications (HCC)     Hyperlipidemia        ONSET DATE: 08/01/2021   REFERRING DIAG: CVA   THERAPY DIAG:  Muscle weakness (generalized)   Rationale for Evaluation and Treatment Rehabilitation   SUBJECTIVE:    SUBJECTIVE STATEMENT:    Pt.  reports having  had a good time at the graduation party this past weekend.  Pt accompanied by: self   PERTINENT HISTORY:   Pt. is a 77 y.o. female who had a unexpectedly hospitalized from 2/12-2/14/2024 for COVID-19, and a UTI.  Pt. 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 Pt. attended inpatient rehab from 08/07/2021-08/22/2021. Pt. received Home health therapy services this past spring.  Pt. has recently had an assessment through driver rehabilitation services at [facility] with recommendations for referrals for  outpatient OT/PT, and ST services. Pt. PMHx includes: HTN, Hyperlipidemia, Macular degeneration, DM, and Obesity.   PRECAUTIONS: None   WEIGHT BEARING RESTRICTIONS No   PAIN:  Are you having pain?  0/10 pain   FALLS: Has patient fallen in last 6 months? Yes.   LIVING ENVIRONMENT: Lives with: lives with their family  Son  Alyssa Mayo Lives in: House/apartment Main living are on one floor Stairs:  2 steps to enter Has following equipment at home: Single point cane, Wheelchair (manual), shower chair, Grab bars, bed rail, and rubber mat.   PLOF: Independent   PATIENT GOALS: To be able to Drive, and cook again   OBJECTIVE:    HAND DOMINANCE: Right    ADLs:  Transfers/ambulation related to ADLs: Independent Eating: Independent Grooming: [employer] Pt. reports being unable to use her left hand to perform haircare.  UB Dressing: Independent pullover shirt, independent now with fastening a bra LB Dressing: Independent donning pants socks, and slide on shoes Toileting: Independent Bathing: Independent Tub Shower transfers: Walk-in shower Independent now     IADLs: Shopping: Needs to be accompanied to the grocery store. Light housekeeping: Do  Meal Prep:  Is able to perform light meal prep Community mobility: Relies on son, and friend Medication management: Son sets up Mining engineer management:TBD Handwriting: TBD   MOBILITY STATUS: Hx of falls out of bed     ACTIVITY TOLERANCE: Activity tolerance:  10-20 min. Before rest break   FUNCTIONAL  OUTCOME MEASURES: FOTO: 61  TR score: 62  10/09/2022: FOTO: 49   UPPER EXTREMITY ROM      Active ROM Right eval Left eval Left  10/09/2022 Left 10/23/22 Left 12/02/2022  Shoulder flexion WFL 54(74) scaption 0(55) 26(80) 32(82)  Shoulder abduction WFL 58(75) 32(54) 32(50) 45(60)  Shoulder adduction         Shoulder extension         Shoulder internal rotation         Shoulder external rotation         Elbow flexion [facility] WFL 145 145 145  Elbow extension [facility] WFL 10(0) 10(0) WFL  Wrist flexion WFL TBD [facility] WFL  Wrist extension WFL TBD [facility] WFL  Wrist ulnar deviation         Wrist radial deviation  Wrist pronation         Wrist supination         (Blank rows = not tested)     UPPER EXTREMITY MMT:      MMT Right eval Left eval Left 10/09/2022 Left 10/23/2022 Left 12/02/2022  Shoulder flexion 4+/5 2+/5 1/5 2-/5 2-/5  Shoulder abduction 4+/5 2+/5 2-/5 2-/5 2-/5  Shoulder adduction         Shoulder extension         Shoulder internal rotation         Shoulder external rotation         Middle trapezius         Lower trapezius         Elbow flexion 4+/5 3+/5 3/5 3/5 3+/5  Elbow extension 4+/5 3+/5 3/5 3/5 3+/5  Wrist flexion 4+/5 TBD 3/5 3/5 3+/5  Wrist extension 4+/5 TBD 3/5 3/5 3+/5  Wrist ulnar deviation         Wrist radial deviation         Wrist pronation         Wrist supination         (Blank rows = not tested)   HAND FUNCTION: Grip strength: Right: 33 lbs; Left: 11 lbs, Lateral pinch: Right: 17 lbs, Left: 2 lbs, and 3 point pinch: Right: 17 lbs, Left: 2 lbs  10/09/2022: Grip strength: Right: 33 lbs; Left: 10 lbs, Lateral pinch: Right: 17 lbs, Left: 1 lbs, and 3 point pinch: Right: 17 lbs, Left: 1 lbs   10/23/2022: Grip strength: Right: 33 lbs; Left: 16 lbs, Lateral pinch: Right: 17 lbs, Left: 1 lbs, and 3 point pinch: Right: 17 lbs, Left: 1 lbs  12/02/2022: Grip strength: Right: 33 lbs; Left: 17 lbs, Lateral pinch: Right: 17 lbs, Left: 7 lbs, and 3 point  pinch: Right: 17 lbs, Left: 3 lbs     COORDINATION:   9 Hole Peg test: Right: 27 sec.  sec; Left: 52 sec.  10/09/2022: 9 Hole Peg test: Right: 27 sec.  sec; Left: 1 min. & 35 sec.  SENSATION: Light touch: WFL Proprioception: WFL   COGNITION: Overall cognitive status: Within functional limits for tasks assessed   VISION: Subjective report: Glasses. Just received a new prescription to increase bifocal strength Baseline vision: Macular Degeneration Visual history: macular degeneration   VISION ASSESSMENT: To be further assessed in functional context  Left sided Awareness      PERCEPTION: Limited left sided awareness     TODAY'S TREATMENT:   Therapeutic Exercise:   Pt. tolerated PROM in all joint ranges of the left shoulder. Pt. worked on [employer], and reciprocal motion using the UBE while seated for 8 min. with no resistance initially, and the resistance gradually increasing. Pt. Performed left shoulder flexion, and abduction at the tabletop surface.   Neuromuscular re-education:  Pt. worked on left hand FMC using long nosed tweezers to grasp 1" thin sticks from a shallow dish, and reposition them in preparation for placing them vertically into the pegboard.    PATIENT EDUCATION: Education details: Compensatory strategies during typing. Person educated: Patient Education method: verbal cues Education comprehension: verbalized understanding and pt does not plan to cook unless son is present.     HOME EXERCISE PROGRAM:  Reviewed home activities to enhance left hand digit extension  during typing skills.   GOALS: Goals reviewed with patient? Yes     SHORT TERM GOALS: Target date: 01/13/2023       Pt. Will  improve FOTO score by 2 points to reflect improved pt. perceived functional performance Baseline: 12/01/2022: FOTO: 51 10/23/2022: FOTO 49 10/09/2022: FOTO 49, FOTO: 61 TR score: 62  Goal status: INITIAL   LONG TERM GOALS: Target date:02/24/2023  Pt. Will  improve left shoulder ROM by 10 degrees to be able able to efficiently apply deodorant Baseline: 12/02/2022: Shoulder flexion: 32(82), Abduction: 45(60)  10/23/2022: Shoulder flexion: 26(80), Abduction: 32(50) 10/09/2022: flexion: 0(55), Abduction: 32(54) Goal status: Improving, but very limited.   2.  Pt. will be able to independently hold and use a blow dryer, and brush for hair care. Baseline: 12/02/2022: Pt. is unable to actively elevate he left shoulder in order to blow dry her hair. 10/23/2022: Pt. continues to be unable to hold a blow dryer. Eval: Pt. Is unable to sustain her BUEs in elevation, and use a blow dryer, and brush. 10/09/2022: Pt. Is unable to hold a blow dryer. Eval: Pt. Is unable to sustain her BUEs in elevation, and use a blow dryer, and brush. Goal status: Ongoing   3.  Pt. Will improve left lateral pinch strength by 3# to be able to independently cut meat. Baseline: 12/02/2022: Left pinch: 7# Pt. has difficulty cutting meat. 10/23/2022: Pt. Continues to present with difficulty cutting meat. 10/09/2022: 1#  Eval: 2#. Pt. has difficulty stabilizing utensil, and food while cutting food. Goal status: Ongoing   4.  Pt. Will improve Left hand [facility] skills to be able to be able to independently, and efficiently manipulate small objects during ADL tasks. Baseline 12/02/2022: TBD  10/23/2022: Pt. Continues to present with difficulty manipulating small objects. 10/09/2022: Left: 1 min & 35 sec.: Eval: Left: 52 sec. Right: 27 sec. Goal status: Ongoing     6.: Pt. will improve with left grip strength to be able to hold and pull up covers. Baseline: 12/02/2022: 17# 10/23/2022: Left: 16# 10/09/2022: Left: 10# Eval:  Left grip strength 11#. Pt. has difficulty holding a full drink  securely with the left hand.             Goal status: Ongoing   7. Pt. Will improve typing speed, and accuracy in preparation for efficiently typing simple email message. Baseline: 12/02/2022:  Pt. continues to present with  limited speed, and accuracy with typing.10/23/2022: Pt. continues to present with limited speed, and accuracy with typing. 10/09/2022: Pt. presents with difficulty typing. Pt. presents with difficulty typing an email. Typing speed, and accuracy TBD             Goal status: INITIAL                                        CLINICAL IMPRESSION:  Pt continue to improve with hand function and strength, responds well to verbal and tactile cues. She continues to have some stiffness in her shoulder, however reported no pain today. She continues to responds well to use of reciprocal arm movement to loosen up shoulder area in the clinic. She continues to work on stretching at home prior to activities. Pt. required cues, and assist for positioning the tweezers in her hand, and dropped multiple sticks. Pt. Was able to grasp, and store objects, however had difficulty performing translatory movements of the hand. Pt. continues to benefit from OT services to work towards improving Left UE functioning in order to maximize engagement in, and overall independence with ADLs, and IADL tasks.  PERFORMANCE DEFICITS in functional skills including ADLs, IADLs, coordination, dexterity, sensation, ROM, strength, FMC, decreased knowledge of use of DME, vision, and UE functional use, cognitive skills including attention, and psychosocial skills including coping strategies, environmental adaptation, habits, and routines and behaviors.    IMPAIRMENTS are limiting patient from ADLs, IADLs, and social participation.    COMORBIDITIES may have co-morbidities  that affects occupational performance. Patient will benefit from skilled OT to address above impairments and improve overall function.   MODIFICATION OR ASSISTANCE TO COMPLETE EVALUATION: Min-Moderate modification of tasks or assist with assess necessary to complete an evaluation.   OT OCCUPATIONAL PROFILE AND HISTORY: Detailed assessment: Review of records and additional review  of physical, cognitive, psychosocial history related to current functional performance.   CLINICAL DECISION MAKING: Moderate - several treatment options, min-mod task modification necessary   REHAB POTENTIAL: Good   EVALUATION COMPLEXITY: Moderate      PLAN: OT FREQUENCY: 2x/week   OT DURATION: 12 weeks   PLANNED INTERVENTIONS: self care/ADL training, therapeutic exercise, therapeutic activity, neuromuscular re-education, manual therapy, passive range of motion, and paraffin   RECOMMENDED OTHER SERVICES: ST, and PT   CONSULTED AND AGREED WITH PLAN OF CARE: Patient   PLAN FOR NEXT SESSION:  L hand strengthening and coordination exercises    Olegario Messier, MS, OTR/L   01/01/23, 1:52 PM

## 2023-01-06 ENCOUNTER — Ambulatory Visit: Payer: Medicare HMO | Admitting: Occupational Therapy

## 2023-01-06 ENCOUNTER — Ambulatory Visit: Payer: Medicare HMO

## 2023-01-06 DIAGNOSIS — M6281 Muscle weakness (generalized): Secondary | ICD-10-CM

## 2023-01-06 DIAGNOSIS — R262 Difficulty in walking, not elsewhere classified: Secondary | ICD-10-CM

## 2023-01-06 DIAGNOSIS — M542 Cervicalgia: Secondary | ICD-10-CM

## 2023-01-06 DIAGNOSIS — M25552 Pain in left hip: Secondary | ICD-10-CM

## 2023-01-06 DIAGNOSIS — R2681 Unsteadiness on feet: Secondary | ICD-10-CM

## 2023-01-06 DIAGNOSIS — R278 Other lack of coordination: Secondary | ICD-10-CM

## 2023-01-06 NOTE — Therapy (Signed)
OUTPATIENT PHYSICAL THERAPY  TREATMENT     Patient Name: Alyssa Mayo MRN: [id number] DOB:11-07-1945, 77 y.o., female Today's Date: 12/04/2022   END OF SESSION:    PT End of Session - 01/06/23 1106     Visit Number 28    Number of Visits 36    Date for PT Re-Evaluation 01/22/23    Authorization Type [employer]    Progress Note Due on Visit 30    PT Start Time 1103    PT Stop Time 1145    PT Time Calculation (min) 42 min    Equipment Utilized During Treatment Gait belt    Activity Tolerance Patient tolerated treatment well;No increased pain    Behavior During Therapy [facility] for tasks assessed/performed                     Past Medical History:  Diagnosis Date   Abnormal levels of other serum enzymes 07/17/2015   Arthritis     Constipation 07/11/2015   COVID-19 03/06/2021   Diabetes mellitus without complication (HCC)     Elevated liver enzymes     Fatty liver     Generalized abdominal pain 07/11/2015   Hyperlipidemia     Hypertension     Lifelong obesity     Macular degeneration     Morbid obesity due to excess calories (HCC) 07/11/2015   Other fatigue 07/11/2015   Sleep apnea     Spinal headache           Past Surgical History:  Procedure Laterality Date   ABDOMINAL HYSTERECTOMY       APPENDECTOMY       CATARACT EXTRACTION       CERVICAL LAMINECTOMY   07/21/1985   CESAREAN SECTION       COLONOSCOPY   07/21/2012    diverticulosis, [facility] Dr. [name]    COLONOSCOPY WITH PROPOFOL N/A 02/04/2019    Procedure: COLONOSCOPY WITH PROPOFOL;  Surgeon: [name], MD;  Location: [facility] ENDOSCOPY;  Service: Endoscopy;  Laterality: N/A;   COLONOSCOPY WITH PROPOFOL N/A 03/18/2021    Procedure: COLONOSCOPY WITH PROPOFOL;  Surgeon: [name], MD;  Location: [facility] ENDOSCOPY;  Service: Endoscopy;  Laterality: N/A;  DM   DIAGNOSTIC LAPAROSCOPY       ESOPHAGOGASTRODUODENOSCOPY (EGD) WITH PROPOFOL N/A 02/04/2019    Procedure: ESOPHAGOGASTRODUODENOSCOPY  (EGD) WITH PROPOFOL;  Surgeon: [name], MD;  Location: [facility] ENDOSCOPY;  Service: Endoscopy;  Laterality: N/A;   ESOPHAGOGASTRODUODENOSCOPY (EGD) WITH PROPOFOL N/A 03/18/2021    Procedure: ESOPHAGOGASTRODUODENOSCOPY (EGD) WITH PROPOFOL;  Surgeon: [name], MD;  Location: [facility] ENDOSCOPY;  Service: Endoscopy;  Laterality: N/A;   LOOP RECORDER INSERTION N/A 08/05/2021    Procedure: LOOP RECORDER INSERTION;  Surgeon: [name], MD;  Location: [facility] INVASIVE CV LAB;  Service: Cardiovascular;  Laterality: N/A;   SPINE SURGERY       TUBAL LIGATION            Patient Active Problem List    Diagnosis Date Noted   UTI (urinary tract infection) 09/01/2022   COVID-19 virus infection 09/01/2022   Chronic diastolic CHF (congestive heart failure) (HCC) 09/01/2022   Chronic kidney disease, stage 3a (HCC) 09/01/2022   Obesity (BMI 30-39.9) 09/01/2022   Paroxysmal atrial fibrillation (HCC) 08/08/2022   Hypercoagulable state due to paroxysmal atrial fibrillation (HCC) 08/08/2022   Cerebral edema (HCC) 08/07/2021   OSA (obstructive sleep apnea) 08/07/2021   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  CVA (cerebral vascular accident) (HCC) 08/01/2021   GERD (gastroesophageal reflux disease) 07/20/2019   Advanced care planning/counseling discussion 12/17/2016   Skin lesions, generalized 11/13/2016   Chronic right hip pain 06/17/2016   Vitamin D deficiency 09/26/2015   Diastasis recti 08/08/2015   Elevated serum GGT level 07/24/2015   Essential hypertension 07/17/2015   Fatty liver 07/17/2015   Elevated alkaline phosphatase level 07/17/2015   Elevated serum glutamic pyruvic transaminase (SGPT) level 07/17/2015   Abnormal finding on EKG 07/11/2015   Morbid obesity (HCC) 07/11/2015   Colon, diverticulosis 07/11/2015   Abdominal wall hernia 07/11/2015   DM (diabetes mellitus), type 2 with neurological complications (HCC)     Hyperlipidemia        PCP: [name],  MD   REFERRING PROVIDER: [name], MD & Edilia Bo, [place]   REFERRING DIAG: [phone] (ICD-10-CM) - Cerebral infarction due to unspecified occlusion or stenosis of unspecified precerebral arteries, Cervicalgia with degenerative disc   THERAPY DIAG:  Muscle weakness (generalized)   Other lack of coordination   Difficulty in walking, not elsewhere classified   Cervical pain   Pain in left hip   Painful cervical ROM   Apraxia   Unsteadiness on feet   Rationale for Evaluation and Treatment: Rehabilitation   ONSET DATE: 09/03/22   SUBJECTIVE:                                                                                                                                                                                                          SUBJECTIVE STATEMENT:    Pt reports no new complaints.  Pt denies any falls, just waiting to have clearance for an MRI.   PERTINENT HISTORY:    Per chart and confirmed by pt: Pt is a 77 yo female with RMCA CVA infarction with hemorrhagic transformation 08/01/2021, with inpatient rehab 08/07/2021-08/22/2021 followed by [facility] therapy until June. Pt now currently being seen for outpatient OT and ST services. Pt reports limitations in stamina persist. Other PMH is significant for HTN, HLD, macular degeneration, DM, obesity, sleep apnea, arthritis, chronic R hip pain, vitamin D deficiency, diastasis recti, abdominal wall hernia, hx of abdominal hysterectomy, appendectomy, spine surgery (years ago).    PAIN:  Are you having pain? Yes: NPRS scale: 0/10 Pain location:  L hip/L knee  Pain description: dull ache Aggravating factors: sitting Relieving factors: laying down   PRECAUTIONS: None   WEIGHT BEARING RESTRICTIONS: No   FALLS:  Has patient fallen in last 6 months? Yes. Number of falls 2   LIVING ENVIRONMENT:  Lives with: lives with their son Lives in: House/apartment Stairs: Yes: Internal: 13 steps; doesn't go up to the second floor  and External: 4 steps; can reach both Has following equipment at home: Single point cane and Walker - 2 wheeled   OCCUPATION: Retired   PLOF: Independent with basic ADLs   PATIENT GOALS: "Get stronger. And reduce fall risk. Reduce reliance on cane"   NEXT MD VISIT: end of May.    OBJECTIVE:    DIAGNOSTIC FINDINGS:      CT HEAD WITHOUT CONTRAST  CT CERVICAL SPINE WITHOUT CONTRAST IMPRESSION: 1. No acute intracranial pathology. 2. Expected interval evolution of encephalomalacia of the right frontal lobe, following intraparenchymal hemorrhage seen acutely on examination dated 08/03/2021. 3. No fracture or static subluxation of the cervical spine. 4. Generally mild disc space height loss and osteophytosis throughout the cervical spine, with partial ankylosis of C5-C6 and a large left eccentric posterior bridging osteophyte at this level. MRI may be used to further evaluate cervical disc and neural foraminal pathology if indicated by neurologically localizing signs and symptoms.   EXAM: xRay PELVIS - 1-2 VIEW  IMPRESSION: 1. No convincing evidence of acute fracture or diastasis of the pelvis. 2. Well corticated osseous focus beneath the right inferior pubic ramus may reflect sequela of avulsion injury or chronic tendinopathy, suggest correlation with direct tenderness to palpation.     PATIENT SURVEYS:  NDI 15/50 = 30% disability FOTO 65/68   10/23/2022 : 52/68  10/23/2022 Foto CVA 57/59   COGNITION: Overall cognitive status: History of cognitive impairments - at baseline   SENSATION: WFL   POSTURE: rounded shoulders and forward head   PALPATION:  tender to touch on L ischial tuberosity.                CERVICAL ROM:    Active ROM A/PROM (deg) eval 3/21 4/4  Flexion 39 25 39  Extension 10 24 25   Right lateral flexion 7 15 15   Left lateral flexion 6 20 14   Right rotation 49 53 41  Left rotation 30 42 35   (Blank rows = not tested)   UPPER EXTREMITY ROM:    Active ROM Right eval Left eval Right 4/4 Left 4/4  Shoulder flexion 128 81 149 22  Shoulder abduction 160 68  158  38   (Blank rows = not tested)     UPPER EXTREMITY MMT:   Grossly decreased (3+/5) in the L UE due to pain and lack of ROM     CERVICAL SPECIAL TESTS:  Spurling's test: Negative and Sharp pursor's test: Negative     LOWER EXTREMITY MMT:     MMT Right 3/21 Left 3/21  Hip flexion 4+ 4-  Hip extension 4+ 4-  Hip abduction 4+ 4  Hip adduction 4+ 4  Hip internal rotation      Hip external rotation      Knee flexion 4+ 4-  Knee extension 4+ 4  Ankle dorsiflexion 4+ 4  Ankle plantarflexion      Ankle inversion      Ankle eversion       (Blank rows = not tested)   LOWER EXTREMITY ROM:      Passive  Right 3/21 Left 3/21  Hip flexion 120 105  Hip extension      Hip abduction      Hip adduction      Hip internal rotation      Hip external rotation      Knee flexion  [facility] WFL  Knee extension (in Hip flexion 90deg) 143 120  Ankle dorsiflexion      Ankle plantarflexion      Ankle inversion      Ankle eversion       (Blank rows = not tested)      LOWER EXTREMITY SPECIAL TESTS:  Hip special tests: Luisa Hart (FABER) test: positive , Hip scouring test: negative, and distraction: negative     FUNCTIONAL TESTS:  6 minute walk test: 963ft with SPC 10 meter walk test: 0.86m/s Not performed at this time.   TODAY'S TREATMENT: DATE: 01/06/23  TherEx:  Sit<>stand with ball between thighs 2x 10 bil   Sti<>stand with green tband 2x 10 bil   Hip abduction GTB, 2x10 Isometric Hip adduction into physioball, 2x10 with 3 sec hold  Hip flexion with GTB around forefoot of each LE, 2x10 Hamstring curl, with GTB, 2x10  Seated LAQ with physioball between knees, upright posture and back away from chair, 2x10  High knee marching in hallway, 5# AW donned, x2 lengths of hallway Lateral stepping with 5# AW donned on each LE, x2 lengths of hallway, pt had increased  difficulty on the L LE to prevent ER of the hip Standing hip abduction with UE support on back of chair, 5# AW donned, x10 each LE     PATIENT EDUCATION:  Education details: Pt educated throughout session about proper posture and technique with exercises. Improved exercise technique, movement at target joints, use of target muscles after min to mod verbal, visual, tactile cues.   Person educated: Patient and   Education method: Explanation Education comprehension: verbalized understanding   HOME EXERCISE PROGRAM:   Access Code: [email or web address] URL: [email or web address] Date: 01/01/2023 Prepared by: Grier Rocher  Exercises - Sit to Stand with Resistance Around Legs  - 1 x daily - 5 x weekly - 3 sets - 10 reps - Seated Hip Adduction Isometrics with Ball  - 1 x daily - 7 x weekly - 3 sets - 10 reps - Standing March with Counter Support  - 1 x daily - 5 x weekly - 3 sets - 10 reps - Seated Hip Abduction with Resistance  - 1 x daily - 5 x weekly - 3 sets - 10 reps - 3 hold  Access Code: [email or web address] URL: [email or web address] Date: 09/18/2022 Prepared by: Tomasa Hose   Exercises - Seated Upper Trapezius Stretch  - 1 x daily - 7 x weekly - 3 sets - 10 reps - Seated Levator Scapulae Stretch  - 1 x daily - 7 x weekly - 3 sets - 10 reps - Seated Scapular Retraction  - 1 x daily - 7 x weekly - 3 sets - 10 reps     ASSESSMENT:   CLINICAL IMPRESSION:  Pt continues to put forth great effort throughout the session.  Pt is able to tolerate therapeutic exercises that involve the strengthening of the LE's.  Pt denies any pain upon arrival today and was able to leave without pain as well.  Pt will continue to be challenged with strength of the LE's as well as balance related activities.  . Pt will continue to benefit from skilled therapy to address remaining deficits in order to improve overall QoL and return to PLOF.             OBJECTIVE IMPAIRMENTS: decreased  activity tolerance, decreased cognition, decreased ROM, decreased strength, decreased safety awareness, hypomobility, and pain.    ACTIVITY LIMITATIONS: carrying, lifting, bending, sitting, standing, squatting, and reach  over head   PARTICIPATION LIMITATIONS: meal prep, cleaning, laundry, driving, shopping, community activity, and yard work   PERSONAL FACTORS: Age, Behavior pattern, Past/current experiences, Time since onset of injury/illness/exacerbation, and 3+ comorbidities: arthritis, DM2, HTN, Obesity,   are also affecting patient's functional outcome.    REHAB POTENTIAL: Good   CLINICAL DECISION MAKING: Stable/uncomplicated   EVALUATION COMPLEXITY: Low     GOALS: Goals reviewed with patient? Yes   SHORT TERM GOALS: Target date: 12/11/2022       Pt will be independent with HEP in order to improve strength of BLE and ROM of the neck in order to return to PLOF. Baseline: provided on neck visit Goal status: INITIAL       LONG TERM GOALS: Target date: 01/22/2023       Patient will demonstrate improved function as evidenced by a score of 68 on FOTO measure for full participation in activities at home and in the community. Baseline: 65. 10/23/22: 52. 5/15 56 for neck and CVA Goal status: IN PROGRESS   2.  Pt to improve NDI by 5 points or 10% in order to demonstrate a reduction in overall disability caused by neck pain at this time. Baseline: 15/50 or 30% disability 4/11 27/50  Goal status: NOT MET   3.  Pt to demonstrate improved cervical rotation to 60 deg bilaterally in order to perform tasks around the house that require turning of the head. Baseline: R/L Cervical Rotation: 49/30. 4/424 R:53. L:42  5/14: R45deg. L50deg  Goal status: IN PROGRESS   4.  Pt to report 0/10 pain in the neck following light household chores at home in order to demonstrate an overall reduction in pain and an improvement in QoL. Baseline: 4/10 at rest 0/10 in neck Goal status: MET   5.  Pt will  improve gait speed to <1.71m/s to indicate improved community mobility and reduced fall risk  Baseline: 0.58m/s  Goal status: revised    6. Pt will increase 6 min walk test >1027ft  Baseline: 940ft with SPC Goal status: in progress.    PLAN:   PT FREQUENCY: 1-2x/week   PT DURATION: 12 weeks   PLANNED INTERVENTIONS: Therapeutic exercises, Therapeutic activity, Neuromuscular re-education, Balance training, Gait training, Patient/Family education, Self Care, Joint mobilization, Joint manipulation, Stair training, Vestibular training, Canalith repositioning, Visual/preceptual remediation/compensation, DME instructions, Dry Needling, Cognitive remediation, Electrical stimulation, Spinal manipulation, Spinal mobilization, Cryotherapy, Moist heat, Traction, Ultrasound, Manual therapy, and Re-evaluation       PLAN FOR NEXT SESSION:   Increased strengthening and dynamic balance training.  Variable gait training  Blaze pods.    Nolon Bussing, PT, DPT Physical Therapist - [facility]  01/06/23, 12:38 PM

## 2023-01-06 NOTE — Therapy (Signed)
Occupational Therapy Neuro Treatment Note   Patient Name: Alyssa Mayo MRN: [id number] DOB:Feb 02, 1946, 77 y.o., female Today's Date: 03/14/2022   PCP: [name], MD REFERRING PROVIDER: [name], NP     OT End of Session - 01/06/23 1125     Visit Number 28    Number of Visits 48    Date for OT Re-Evaluation 02/24/23    Authorization Type Progress reporting period starting 09/16/2022    OT Start Time 1020    OT Stop Time 1100    OT Time Calculation (min) 40 min    Activity Tolerance Patient tolerated treatment well    Behavior During Therapy [facility] for tasks assessed/performed                                    Past Medical History:  Diagnosis Date   Abnormal levels of other serum enzymes 07/17/2015   Arthritis     Constipation 07/11/2015   COVID-19 03/06/2021   Diabetes mellitus without complication (HCC)     Elevated liver enzymes     Fatty liver     Generalized abdominal pain 07/11/2015   Hyperlipidemia     Hypertension     Lifelong obesity     Macular degeneration     Morbid obesity due to excess calories (HCC) 07/11/2015   Other fatigue 07/11/2015   Sleep apnea     Spinal headache           Past Surgical History:  Procedure Laterality Date   ABDOMINAL HYSTERECTOMY       APPENDECTOMY       CATARACT EXTRACTION       CERVICAL LAMINECTOMY   07/21/1985   CESAREAN SECTION       COLONOSCOPY   07/21/2012    diverticulosis, [facility] Dr. [name]    COLONOSCOPY WITH PROPOFOL N/A 02/04/2019    Procedure: COLONOSCOPY WITH PROPOFOL;  Surgeon: [name], MD;  Location: [facility] ENDOSCOPY;  Service: Endoscopy;  Laterality: N/A;   COLONOSCOPY WITH PROPOFOL N/A 03/18/2021    Procedure: COLONOSCOPY WITH PROPOFOL;  Surgeon: [name], MD;  Location: [facility] ENDOSCOPY;  Service: Endoscopy;  Laterality: N/A;  DM   DIAGNOSTIC LAPAROSCOPY       ESOPHAGOGASTRODUODENOSCOPY (EGD) WITH PROPOFOL N/A 02/04/2019    Procedure: ESOPHAGOGASTRODUODENOSCOPY (EGD)  WITH PROPOFOL;  Surgeon: [name], MD;  Location: [facility] ENDOSCOPY;  Service: Endoscopy;  Laterality: N/A;   ESOPHAGOGASTRODUODENOSCOPY (EGD) WITH PROPOFOL N/A 03/18/2021    Procedure: ESOPHAGOGASTRODUODENOSCOPY (EGD) WITH PROPOFOL;  Surgeon: [name], MD;  Location: [facility] ENDOSCOPY;  Service: Endoscopy;  Laterality: N/A;   LOOP RECORDER INSERTION N/A 08/05/2021    Procedure: LOOP RECORDER INSERTION;  Surgeon: [name], MD;  Location: [facility] INVASIVE CV LAB;  Service: Cardiovascular;  Laterality: N/A;   SPINE SURGERY       TUBAL LIGATION            Patient Active Problem List    Diagnosis Date Noted   Paroxysmal atrial fibrillation (HCC) 08/08/2022   Hypercoagulable state due to paroxysmal atrial fibrillation (HCC) 08/08/2022   Cerebral edema (HCC) 08/07/2021   OSA (obstructive sleep apnea) 08/07/2021   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   CVA (cerebral vascular accident) (HCC) 08/01/2021   GERD (gastroesophageal reflux disease) 07/20/2019   Advanced care planning/counseling discussion 12/17/2016   Skin lesions, generalized 11/13/2016   Chronic right hip pain 06/17/2016  Vitamin D deficiency 09/26/2015   Diastasis recti 08/08/2015   Elevated serum GGT level 07/24/2015   Essential hypertension 07/17/2015   Fatty liver 07/17/2015   Elevated alkaline phosphatase level 07/17/2015   Elevated serum glutamic pyruvic transaminase (SGPT) level 07/17/2015   Abnormal finding on EKG 07/11/2015   Morbid obesity (HCC) 07/11/2015   Colon, diverticulosis 07/11/2015   Abdominal wall hernia 07/11/2015   DM (diabetes mellitus), type 2 with neurological complications (HCC)     Hyperlipidemia        ONSET DATE: 08/01/2021   REFERRING DIAG: CVA   THERAPY DIAG:  Muscle weakness (generalized)   Rationale for Evaluation and Treatment Rehabilitation   SUBJECTIVE:    SUBJECTIVE STATEMENT:    Pt.  reports  having had a nice Father's Day.  Pt accompanied by:  self   PERTINENT HISTORY:  Pt. is a 77 y.o. female who had a unexpectedly hospitalized from 2/12-2/14/2024 for COVID-19, and a UTI.  Pt. 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 Pt. attended inpatient rehab from 08/07/2021-08/22/2021. Pt. received Home health therapy services this past spring.  Pt. has recently had an assessment through driver rehabilitation services at [facility] with recommendations for referrals for  outpatient OT/PT, and ST services. Pt. PMHx includes: HTN, Hyperlipidemia, Macular degeneration, DM, and Obesity.   PRECAUTIONS: None   WEIGHT BEARING RESTRICTIONS No   PAIN:  Are you having pain?  5/10 pain   FALLS: Has patient fallen in last 6 months? Yes.   LIVING ENVIRONMENT: Lives with: lives with their family  Son  [name] Lives in: House/apartment Main living are on one floor Stairs:  2 steps to enter Has following equipment at home: Single point cane, Wheelchair (manual), shower chair, Grab bars, bed rail, and rubber mat.   PLOF: Independent   PATIENT GOALS: To be able to Drive, and cook again   OBJECTIVE:    HAND DOMINANCE: Right    ADLs:  Transfers/ambulation related to ADLs: Independent Eating: Independent Grooming: [employer] Pt. reports being unable to use her left hand to perform haircare.  UB Dressing: Independent pullover shirt, independent now with fastening a bra LB Dressing: Independent donning pants socks, and slide on shoes Toileting: Independent Bathing: Independent Tub Shower transfers: Walk-in shower Independent now     IADLs: Shopping: Needs to be accompanied to the grocery store. Light housekeeping: Do  Meal Prep:  Is able to perform light meal prep Community mobility: Relies on son, and friend Medication management: Son sets up Mining engineer management:TBD Handwriting: TBD   MOBILITY STATUS: Hx of falls out of bed     ACTIVITY TOLERANCE: Activity tolerance:  10-20 min.  Before rest break   FUNCTIONAL OUTCOME MEASURES: FOTO: 61  TR score: 62  10/09/2022: FOTO: 49   UPPER EXTREMITY ROM      Active ROM Right eval Left eval Left  10/09/2022 Left 10/23/22 Left 12/02/2022  Shoulder flexion WFL 54(74) scaption 0(55) 26(80) 32(82)  Shoulder abduction WFL 58(75) 32(54) 32(50) 45(60)  Shoulder adduction         Shoulder extension         Shoulder internal rotation         Shoulder external rotation         Elbow flexion [facility] WFL 145 145 145  Elbow extension [facility] WFL 10(0) 10(0) WFL  Wrist flexion WFL TBD [facility] WFL  Wrist extension WFL TBD [facility] WFL  Wrist ulnar deviation  Wrist radial deviation         Wrist pronation         Wrist supination         (Blank rows = not tested)     UPPER EXTREMITY MMT:      MMT Right eval Left eval Left 10/09/2022 Left 10/23/2022 Left 12/02/2022  Shoulder flexion 4+/5 2+/5 1/5 2-/5 2-/5  Shoulder abduction 4+/5 2+/5 2-/5 2-/5 2-/5  Shoulder adduction         Shoulder extension         Shoulder internal rotation         Shoulder external rotation         Middle trapezius         Lower trapezius         Elbow flexion 4+/5 3+/5 3/5 3/5 3+/5  Elbow extension 4+/5 3+/5 3/5 3/5 3+/5  Wrist flexion 4+/5 TBD 3/5 3/5 3+/5  Wrist extension 4+/5 TBD 3/5 3/5 3+/5  Wrist ulnar deviation         Wrist radial deviation         Wrist pronation         Wrist supination         (Blank rows = not tested)   HAND FUNCTION: Grip strength: Right: 33 lbs; Left: 11 lbs, Lateral pinch: Right: 17 lbs, Left: 2 lbs, and 3 point pinch: Right: 17 lbs, Left: 2 lbs  10/09/2022: Grip strength: Right: 33 lbs; Left: 10 lbs, Lateral pinch: Right: 17 lbs, Left: 1 lbs, and 3 point pinch: Right: 17 lbs, Left: 1 lbs   10/23/2022: Grip strength: Right: 33 lbs; Left: 16 lbs, Lateral pinch: Right: 17 lbs, Left: 1 lbs, and 3 point pinch: Right: 17 lbs, Left: 1 lbs  12/02/2022: Grip strength: Right: 33 lbs; Left: 17 lbs, Lateral pinch: Right:  17 lbs, Left: 7 lbs, and 3 point pinch: Right: 17 lbs, Left: 3 lbs     COORDINATION:   9 Hole Peg test: Right: 27 sec.  sec; Left: 52 sec.  10/09/2022: 9 Hole Peg test: Right: 27 sec.  sec; Left: 1 min. & 35 sec.  SENSATION: Light touch: WFL Proprioception: WFL   COGNITION: Overall cognitive status: Within functional limits for tasks assessed   VISION: Subjective report: Glasses. Just received a new prescription to increase bifocal strength Baseline vision: Macular Degeneration Visual history: macular degeneration   VISION ASSESSMENT: To be further assessed in functional context  Left sided Awareness      PERCEPTION: Limited left sided awareness     TODAY'S TREATMENT:   Therapeutic Exercise:   Pt. tolerated PROM in all joint ranges of the left shoulder. Shoulder flexion and abduction at the tabletop surface. Pt. worked on [employer], and reciprocal motion using the UBE while seated for 8 min. with no resistance initially, and the resistance gradually increasing. Pt. Performed left shoulder flexion, and abduction at the tabletop surface.   Neuromuscular re-education:  Pt. worked on left hand left hand [facility] skills, and translatory movements after   grasping 1" square letter tiles from the tabletop, and storing them in the palm of her hand. Pt worked on formulating 5 letter words.     PATIENT EDUCATION: Education details: Compensatory strategies during typing. Person educated: Patient Education method: verbal cues Education comprehension: verbalized understanding and pt does not plan to cook unless son is present.     HOME EXERCISE PROGRAM:  Reviewed home activities to enhance left hand digit extension  during  typing skills.   GOALS: Goals reviewed with patient? Yes     SHORT TERM GOALS: Target date: 01/13/2023       Pt. Will improve FOTO score by 2 points to reflect improved pt. perceived functional performance Baseline: 12/01/2022: FOTO: 51 10/23/2022: FOTO  49 10/09/2022: FOTO 49, FOTO: 61 TR score: 62  Goal status: INITIAL   LONG TERM GOALS: Target date:02/24/2023  Pt. Will improve left shoulder ROM by 10 degrees to be able able to efficiently apply deodorant Baseline: 12/02/2022: Shoulder flexion: 32(82), Abduction: 45(60)  10/23/2022: Shoulder flexion: 26(80), Abduction: 32(50) 10/09/2022: flexion: 0(55), Abduction: 32(54) Goal status: Improving, but very limited.   2.  Pt. will be able to independently hold and use a blow dryer, and brush for hair care. Baseline: 12/02/2022: Pt. is unable to actively elevate he left shoulder in order to blow dry her hair. 10/23/2022: Pt. continues to be unable to hold a blow dryer. Eval: Pt. Is unable to sustain her BUEs in elevation, and use a blow dryer, and brush. 10/09/2022: Pt. Is unable to hold a blow dryer. Eval: Pt. Is unable to sustain her BUEs in elevation, and use a blow dryer, and brush. Goal status: Ongoing   3.  Pt. Will improve left lateral pinch strength by 3# to be able to independently cut meat. Baseline: 12/02/2022: Left pinch: 7# Pt. has difficulty cutting meat. 10/23/2022: Pt. Continues to present with difficulty cutting meat. 10/09/2022: 1#  Eval: 2#. Pt. has difficulty stabilizing utensil, and food while cutting food. Goal status: Ongoing   4.  Pt. Will improve Left hand [facility] skills to be able to be able to independently, and efficiently manipulate small objects during ADL tasks. Baseline 12/02/2022: TBD  10/23/2022: Pt. Continues to present with difficulty manipulating small objects. 10/09/2022: Left: 1 min & 35 sec.: Eval: Left: 52 sec. Right: 27 sec. Goal status: Ongoing     6.: Pt. will improve with left grip strength to be able to hold and pull up covers. Baseline: 12/02/2022: 17# 10/23/2022: Left: 16# 10/09/2022: Left: 10# Eval:  Left grip strength 11#. Pt. has difficulty holding a full drink  securely with the left hand.             Goal status: Ongoing   7. Pt. Will improve typing speed, and  accuracy in preparation for efficiently typing simple email message. Baseline: 12/02/2022:  Pt. continues to present with limited speed, and accuracy with typing.10/23/2022: Pt. continues to present with limited speed, and accuracy with typing. 10/09/2022: Pt. presents with difficulty typing. Pt. presents with difficulty typing an email. Typing speed, and accuracy TBD             Goal status: INITIAL                                        CLINICAL IMPRESSION:  Pt. reports having an orthopedic appointment with P.A. Mundy regarding left shoulder pain, and limitations. Pt. Awaiting further imaging through MRI/CT scan for rotator cuff injury, and has a follow-up appointment with Dr. Allena Katz. Pt continues to improve with hand function and strength, responds well to verbal and tactile cues. She continues to have some stiffness in her left shoulder, and has intermittent 5/10 pain.  She continues to respond well to use of reciprocal arm movement to loosen up shoulder area in the clinic. She continues to work on stretching at home prior to  activities. Pt. required cues, and assist for positioning the tweezers in her hand, and dropped multiple sticks. Pt. Was able to grasp, and store objects, however had difficulty performing translatory movements of the hand. Pt. continues to benefit from OT services to work towards improving Left UE functioning in order to maximize engagement in, and overall independence with ADLs, and IADL tasks.    PERFORMANCE DEFICITS in functional skills including ADLs, IADLs, coordination, dexterity, sensation, ROM, strength, FMC, decreased knowledge of use of DME, vision, and UE functional use, cognitive skills including attention, and psychosocial skills including coping strategies, environmental adaptation, habits, and routines and behaviors.    IMPAIRMENTS are limiting patient from ADLs, IADLs, and social participation.    COMORBIDITIES may have co-morbidities  that affects occupational  performance. Patient will benefit from skilled OT to address above impairments and improve overall function.   MODIFICATION OR ASSISTANCE TO COMPLETE EVALUATION: Min-Moderate modification of tasks or assist with assess necessary to complete an evaluation.   OT OCCUPATIONAL PROFILE AND HISTORY: Detailed assessment: Review of records and additional review of physical, cognitive, psychosocial history related to current functional performance.   CLINICAL DECISION MAKING: Moderate - several treatment options, min-mod task modification necessary   REHAB POTENTIAL: Good   EVALUATION COMPLEXITY: Moderate      PLAN: OT FREQUENCY: 2x/week   OT DURATION: 12 weeks   PLANNED INTERVENTIONS: self care/ADL training, therapeutic exercise, therapeutic activity, neuromuscular re-education, manual therapy, passive range of motion, and paraffin   RECOMMENDED OTHER SERVICES: ST, and PT   CONSULTED AND AGREED WITH PLAN OF CARE: Patient   PLAN FOR NEXT SESSION:  L hand strengthening and coordination exercises    Olegario Messier, MS, OTR/L   01/06/23, 11:28 AM

## 2023-01-08 ENCOUNTER — Ambulatory Visit: Payer: Medicare HMO | Admitting: Occupational Therapy

## 2023-01-08 ENCOUNTER — Ambulatory Visit: Payer: Medicare HMO

## 2023-01-08 DIAGNOSIS — R262 Difficulty in walking, not elsewhere classified: Secondary | ICD-10-CM

## 2023-01-08 DIAGNOSIS — R278 Other lack of coordination: Secondary | ICD-10-CM

## 2023-01-08 DIAGNOSIS — M6281 Muscle weakness (generalized): Secondary | ICD-10-CM | POA: Diagnosis not present

## 2023-01-08 DIAGNOSIS — R2681 Unsteadiness on feet: Secondary | ICD-10-CM

## 2023-01-08 NOTE — Therapy (Signed)
OUTPATIENT PHYSICAL THERAPY  TREATMENT     Patient Name: Alyssa Mayo MRN: [id number] DOB:08/02/1945, 77 y.o., female Today's Date: 12/04/2022   END OF SESSION:    PT End of Session - 01/08/23 1102     Visit Number 29    Number of Visits 36    Date for PT Re-Evaluation 01/22/23    Authorization Type [employer]    Authorization Time Period 10/30/22-01/22/23    Progress Note Due on Visit 30    PT Start Time 1101    PT Stop Time 1141    PT Time Calculation (min) 40 min    Equipment Utilized During Treatment Gait belt    Activity Tolerance Patient tolerated treatment well;No increased pain    Behavior During Therapy [facility] for tasks assessed/performed                     Past Medical History:  Diagnosis Date   Abnormal levels of other serum enzymes 07/17/2015   Arthritis     Constipation 07/11/2015   COVID-19 03/06/2021   Diabetes mellitus without complication (HCC)     Elevated liver enzymes     Fatty liver     Generalized abdominal pain 07/11/2015   Hyperlipidemia     Hypertension     Lifelong obesity     Macular degeneration     Morbid obesity due to excess calories (HCC) 07/11/2015   Other fatigue 07/11/2015   Sleep apnea     Spinal headache           Past Surgical History:  Procedure Laterality Date   ABDOMINAL HYSTERECTOMY       APPENDECTOMY       CATARACT EXTRACTION       CERVICAL LAMINECTOMY   07/21/1985   CESAREAN SECTION       COLONOSCOPY   07/21/2012    diverticulosis, [facility] Dr. [name]    COLONOSCOPY WITH PROPOFOL N/A 02/04/2019    Procedure: COLONOSCOPY WITH PROPOFOL;  Surgeon: [name], MD;  Location: [facility] ENDOSCOPY;  Service: Endoscopy;  Laterality: N/A;   COLONOSCOPY WITH PROPOFOL N/A 03/18/2021    Procedure: COLONOSCOPY WITH PROPOFOL;  Surgeon: [name], MD;  Location: [facility] ENDOSCOPY;  Service: Endoscopy;  Laterality: N/A;  DM   DIAGNOSTIC LAPAROSCOPY       ESOPHAGOGASTRODUODENOSCOPY (EGD) WITH PROPOFOL N/A  02/04/2019    Procedure: ESOPHAGOGASTRODUODENOSCOPY (EGD) WITH PROPOFOL;  Surgeon: [name], MD;  Location: [facility] ENDOSCOPY;  Service: Endoscopy;  Laterality: N/A;   ESOPHAGOGASTRODUODENOSCOPY (EGD) WITH PROPOFOL N/A 03/18/2021    Procedure: ESOPHAGOGASTRODUODENOSCOPY (EGD) WITH PROPOFOL;  Surgeon: [name], MD;  Location: [facility] ENDOSCOPY;  Service: Endoscopy;  Laterality: N/A;   LOOP RECORDER INSERTION N/A 08/05/2021    Procedure: LOOP RECORDER INSERTION;  Surgeon: [name], MD;  Location: [facility] INVASIVE CV LAB;  Service: Cardiovascular;  Laterality: N/A;   SPINE SURGERY       TUBAL LIGATION            Patient Active Problem List    Diagnosis Date Noted   UTI (urinary tract infection) 09/01/2022   COVID-19 virus infection 09/01/2022   Chronic diastolic CHF (congestive heart failure) (HCC) 09/01/2022   Chronic kidney disease, stage 3a (HCC) 09/01/2022   Obesity (BMI 30-39.9) 09/01/2022   Paroxysmal atrial fibrillation (HCC) 08/08/2022   Hypercoagulable state due to paroxysmal atrial fibrillation (HCC) 08/08/2022   Cerebral edema (HCC) 08/07/2021   OSA (obstructive sleep apnea) 08/07/2021   Right  middle cerebral artery stroke (HCC) 08/07/2021   CVA (cerebral vascular accident) (HCC) 08/01/2021   GERD (gastroesophageal reflux disease) 07/20/2019   Advanced care planning/counseling discussion 12/17/2016   Skin lesions, generalized 11/13/2016   Chronic right hip pain 06/17/2016   Vitamin D deficiency 09/26/2015   Diastasis recti 08/08/2015   Elevated serum GGT level 07/24/2015   Essential hypertension 07/17/2015   Fatty liver 07/17/2015   Elevated alkaline phosphatase level 07/17/2015   Elevated serum glutamic pyruvic transaminase (SGPT) level 07/17/2015   Abnormal finding on EKG 07/11/2015   Morbid obesity (HCC) 07/11/2015   Colon, diverticulosis 07/11/2015   Abdominal wall hernia 07/11/2015   DM (diabetes mellitus), type 2 with neurological complications (HCC)      Hyperlipidemia        PCP: [name], MD   REFERRING PROVIDER: [name], MD & Edilia Bo, [place]   REFERRING DIAG: [phone] (ICD-10-CM) - Cerebral infarction due to unspecified occlusion or stenosis of unspecified precerebral arteries, Cervicalgia with degenerative disc   THERAPY DIAG:  Muscle weakness (generalized)   Other lack of coordination   Difficulty in walking, not elsewhere classified   Cervical pain   Pain in left hip   Painful cervical ROM   Apraxia   Unsteadiness on feet   Rationale for Evaluation and Treatment: Rehabilitation   ONSET DATE: 09/03/22   SUBJECTIVE:                                                                                                                                                                                                          SUBJECTIVE STATEMENT:  Pt still waiting to hear back about MRI for left shoulder. No pain today.    PERTINENT HISTORY:  Pt is a 77 yo female with RMCA CVA infarction with hemorrhagic transformation 08/01/2021, with inpatient rehab 08/07/2021-08/22/2021 followed by [facility] therapy until June. Pt now currently being seen for outpatient OT and ST services. Pt reports limitations in stamina persist. Other PMH is significant for HTN, HLD, macular degeneration, DM, obesity, sleep apnea, arthritis, chronic R hip pain, vitamin D deficiency, diastasis recti, abdominal wall hernia, hx of abdominal hysterectomy, appendectomy, spine surgery (years ago).    PAIN:  Are you having pain? Yes: NPRS scale: 0/10 Pain location:  L hip/L knee  Pain description: dull ache Aggravating factors: sitting Relieving factors: laying down   PRECAUTIONS: None   WEIGHT BEARING RESTRICTIONS: No   FALLS:  Has patient fallen in last 6 months? Yes. Number of falls 2   LIVING ENVIRONMENT: Lives with: lives with their  son Lives in: House/apartment Stairs: Yes: Internal: 13 steps; doesn't go up to the second floor and  External: 4 steps; can reach both Has following equipment at home: Single point cane and Walker - 2 wheeled   OCCUPATION: Retired   PLOF: Independent with basic ADLs   PATIENT GOALS: "Get stronger. And reduce fall risk. Reduce reliance on cane"    OBJECTIVE:        TODAY'S TREATMENT: DATE: 01/08/23  -AMB overground lower level 423ft 3:27, needs a break  3 minutes seated recovery  -AMB overground lower level 453ft (2:50)  3 minutes seated recovery  -AMB overground lower level 439ft   FOTO Survey: 66  Sidestepping in // bars x 5lb AW (5x bilat)  Forward backward AMB in bars c 5lb AW bilat (5x each )   AMB overground with firm, soft surfaces and step up/step down 3x c bilat load carry, 3x c central ized load carry     PATIENT EDUCATION:  Education details: Pt educated throughout session about proper posture and technique with exercises. Improved exercise technique, movement at target joints, use of target muscles after min to mod verbal, visual, tactile cues.   Person educated: Patient and   Education method: Explanation Education comprehension: verbalized understanding   HOME EXERCISE PROGRAM:   Access Code: [email or web address] URL: [email or web address] Date: 01/01/2023 Prepared by: Grier Rocher  Exercises - Sit to Stand with Resistance Around Legs  - 1 x daily - 5 x weekly - 3 sets - 10 reps - Seated Hip Adduction Isometrics with Ball  - 1 x daily - 7 x weekly - 3 sets - 10 reps - Standing March with Counter Support  - 1 x daily - 5 x weekly - 3 sets - 10 reps - Seated Hip Abduction with Resistance  - 1 x daily - 5 x weekly - 3 sets - 10 reps - 3 hold  Access Code: [email or web address] URL: [email or web address] Date: 09/18/2022 Prepared by: Tomasa Hose   Exercises - Seated Upper Trapezius Stretch  - 1 x daily - 7 x weekly - 3 sets - 10 reps - Seated Levator Scapulae Stretch  - 1 x daily - 7 x weekly - 3 sets - 10 reps - Seated Scapular Retraction  -  1 x daily - 7 x weekly - 3 sets - 10 reps     ASSESSMENT:   CLINICAL IMPRESSION: Pt continues to put forth great effort throughout the session.  Pt fatigues with sustained efforts, but recovers well with rest. Pt had made great progress overall thus far with PT, should be ready for DC planning soon, however sounds though this has not been discussed. Pt will continue to benefit from skilled therapy to address remaining deficits in order to improve overall QoL and return to PLOF.         OBJECTIVE IMPAIRMENTS: decreased activity tolerance, decreased cognition, decreased ROM, decreased strength, decreased safety awareness, hypomobility, and pain.    ACTIVITY LIMITATIONS: carrying, lifting, bending, sitting, standing, squatting, and reach over head   PARTICIPATION LIMITATIONS: meal prep, cleaning, laundry, driving, shopping, community activity, and yard work   PERSONAL FACTORS: Age, Behavior pattern, Past/current experiences, Time since onset of injury/illness/exacerbation, and 3+ comorbidities: arthritis, DM2, HTN, Obesity,   are also affecting patient's functional outcome.    REHAB POTENTIAL: Good   CLINICAL DECISION MAKING: Stable/uncomplicated   EVALUATION COMPLEXITY: Low     GOALS: Goals reviewed with patient? Yes   SHORT TERM GOALS: Target date: 12/11/2022  Pt will be independent with HEP in order to improve strength of BLE and ROM of the neck in order to return to PLOF. Baseline: provided on neck visit Goal status: INITIAL     LONG TERM GOALS: Target date: 01/22/2023     Patient will demonstrate improved function as evidenced by a score of 68 on FOTO measure for full participation in activities at home and in the community. Baseline: 65. 10/23/22: 52. 5/15 56 for neck and CVA Goal status: IN PROGRESS   2.  Pt to improve NDI by 5 points or 10% in order to demonstrate a reduction in overall disability caused by neck pain at this time. Baseline: 15/50 or 30% disability  4/11 27/50  Goal status: NOT MET   3.  Pt to demonstrate improved cervical rotation to 60 deg bilaterally in order to perform tasks around the house that require turning of the head. Baseline: R/L Cervical Rotation: 49/30. 4/424 R:53. L:42  5/14: R45deg. L50deg  Goal status: IN PROGRESS   4.  Pt to report 0/10 pain in the neck following light household chores at home in order to demonstrate an overall reduction in pain and an improvement in QoL. Baseline: 4/10 at rest 0/10 in neck Goal status: MET   5.  Pt will improve gait speed to <1.17m/s to indicate improved community mobility and reduced fall risk  Baseline: 0.72m/s  Goal status: achieved     6. Pt will increase 6 min walk test >1062ft  Baseline: 976ft with SPC Goal status: in progress.    PLAN:   PT FREQUENCY: 1-2x/week   PT DURATION: 12 weeks   PLANNED INTERVENTIONS: Therapeutic exercises, Therapeutic activity, Neuromuscular re-education, Balance training, Gait training, Patient/Family education, Self Care, Joint mobilization, Joint manipulation, Stair training, Vestibular training, Canalith repositioning, Visual/preceptual remediation/compensation, DME instructions, Dry Needling, Cognitive remediation, Electrical stimulation, Spinal manipulation, Spinal mobilization, Cryotherapy, Moist heat, Traction, Ultrasound, Manual therapy, and Re-evaluation       PLAN FOR NEXT SESSION:   11:05 AM, 01/08/23 Rosamaria Lints, PT, DPT Physical Therapist - [facility]  Outpatient Physical Therapy- Main Campus [phone]      01/08/23, 11:04 AM

## 2023-01-08 NOTE — Therapy (Addendum)
Occupational Therapy Neuro Treatment Note   Patient Name: Alyssa Mayo MRN: [id number] DOB:Mar 13, 1946, 77 y.o., female Today's Date: 03/14/2022   PCP: [name], MD REFERRING PROVIDER: [name], NP     OT End of Session - 01/08/23 1025     Visit Number 29    Date for OT Re-Evaluation 02/24/23    Authorization Type Progress reporting period starting 09/16/2022    OT Start Time 1020    OT Stop Time 1100    OT Time Calculation (min) 40 min    Activity Tolerance Patient tolerated treatment well    Behavior During Therapy [facility] for tasks assessed/performed                                    Past Medical History:  Diagnosis Date   Abnormal levels of other serum enzymes 07/17/2015   Arthritis     Constipation 07/11/2015   COVID-19 03/06/2021   Diabetes mellitus without complication (HCC)     Elevated liver enzymes     Fatty liver     Generalized abdominal pain 07/11/2015   Hyperlipidemia     Hypertension     Lifelong obesity     Macular degeneration     Morbid obesity due to excess calories (HCC) 07/11/2015   Other fatigue 07/11/2015   Sleep apnea     Spinal headache           Past Surgical History:  Procedure Laterality Date   ABDOMINAL HYSTERECTOMY       APPENDECTOMY       CATARACT EXTRACTION       CERVICAL LAMINECTOMY   07/21/1985   CESAREAN SECTION       COLONOSCOPY   07/21/2012    diverticulosis, [facility] Dr. [name]    COLONOSCOPY WITH PROPOFOL N/A 02/04/2019    Procedure: COLONOSCOPY WITH PROPOFOL;  Surgeon: [name], MD;  Location: [facility] ENDOSCOPY;  Service: Endoscopy;  Laterality: N/A;   COLONOSCOPY WITH PROPOFOL N/A 03/18/2021    Procedure: COLONOSCOPY WITH PROPOFOL;  Surgeon: [name], MD;  Location: [facility] ENDOSCOPY;  Service: Endoscopy;  Laterality: N/A;  DM   DIAGNOSTIC LAPAROSCOPY       ESOPHAGOGASTRODUODENOSCOPY (EGD) WITH PROPOFOL N/A 02/04/2019    Procedure: ESOPHAGOGASTRODUODENOSCOPY (EGD) WITH PROPOFOL;   Surgeon: [name], MD;  Location: [facility] ENDOSCOPY;  Service: Endoscopy;  Laterality: N/A;   ESOPHAGOGASTRODUODENOSCOPY (EGD) WITH PROPOFOL N/A 03/18/2021    Procedure: ESOPHAGOGASTRODUODENOSCOPY (EGD) WITH PROPOFOL;  Surgeon: [name], MD;  Location: [facility] ENDOSCOPY;  Service: Endoscopy;  Laterality: N/A;   LOOP RECORDER INSERTION N/A 08/05/2021    Procedure: LOOP RECORDER INSERTION;  Surgeon: [name], MD;  Location: [facility] INVASIVE CV LAB;  Service: Cardiovascular;  Laterality: N/A;   SPINE SURGERY       TUBAL LIGATION            Patient Active Problem List    Diagnosis Date Noted   Paroxysmal atrial fibrillation (HCC) 08/08/2022   Hypercoagulable state due to paroxysmal atrial fibrillation (HCC) 08/08/2022   Cerebral edema (HCC) 08/07/2021   OSA (obstructive sleep apnea) 08/07/2021   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   CVA (cerebral vascular accident) (HCC) 08/01/2021   GERD (gastroesophageal reflux disease) 07/20/2019   Advanced care planning/counseling discussion 12/17/2016   Skin lesions, generalized 11/13/2016   Chronic right hip pain 06/17/2016   Vitamin D deficiency 09/26/2015  Diastasis recti 08/08/2015   Elevated serum GGT level 07/24/2015   Essential hypertension 07/17/2015   Fatty liver 07/17/2015   Elevated alkaline phosphatase level 07/17/2015   Elevated serum glutamic pyruvic transaminase (SGPT) level 07/17/2015   Abnormal finding on EKG 07/11/2015   Morbid obesity (HCC) 07/11/2015   Colon, diverticulosis 07/11/2015   Abdominal wall hernia 07/11/2015   DM (diabetes mellitus), type 2 with neurological complications (HCC)     Hyperlipidemia        ONSET DATE: 08/01/2021   REFERRING DIAG: CVA   THERAPY DIAG:  Muscle weakness (generalized)   Rationale for Evaluation and Treatment Rehabilitation   SUBJECTIVE:    SUBJECTIVE STATEMENT:    Pt. reports waiting to hear from the MD about whether or not she can have an  MRI.  Pt accompanied by: self   PERTINENT HISTORY:  Pt. is a 77 y.o. female who had a unexpectedly hospitalized from 2/12-2/14/2024 for COVID-19, and a UTI.  Pt. 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 Pt. attended inpatient rehab from 08/07/2021-08/22/2021. Pt. received Home health therapy services this past spring.  Pt. has recently had an assessment through driver rehabilitation services at [facility] with recommendations for referrals for  outpatient OT/PT, and ST services. Pt. PMHx includes: HTN, Hyperlipidemia, Macular degeneration, DM, and Obesity.   PRECAUTIONS: None   WEIGHT BEARING RESTRICTIONS No   PAIN:  Are you having pain?  0/10 pain   FALLS: Has patient fallen in last 6 months? Yes.   LIVING ENVIRONMENT: Lives with: lives with their family  Son  [name] Lives in: House/apartment Main living are on one floor Stairs:  2 steps to enter Has following equipment at home: Single point cane, Wheelchair (manual), shower chair, Grab bars, bed rail, and rubber mat.   PLOF: Independent   PATIENT GOALS: To be able to Drive, and cook again   OBJECTIVE:    HAND DOMINANCE: Right    ADLs:  Transfers/ambulation related to ADLs: Independent Eating: Independent Grooming: [employer] Pt. reports being unable to use her left hand to perform haircare.  UB Dressing: Independent pullover shirt, independent now with fastening a bra LB Dressing: Independent donning pants socks, and slide on shoes Toileting: Independent Bathing: Independent Tub Shower transfers: Walk-in shower Independent now     IADLs: Shopping: Needs to be accompanied to the grocery store. Light housekeeping: Do  Meal Prep:  Is able to perform light meal prep Community mobility: Relies on son, and friend Medication management: Son sets up Mining engineer management:TBD Handwriting: TBD   MOBILITY STATUS: Hx of falls out of bed     ACTIVITY TOLERANCE: Activity  tolerance:  10-20 min. Before rest break   FUNCTIONAL OUTCOME MEASURES: FOTO: 61  TR score: 62  10/09/2022: FOTO: 49   UPPER EXTREMITY ROM      Active ROM Right eval Left eval Left  10/09/2022 Left 10/23/22 Left 12/02/2022  Shoulder flexion WFL 54(74) scaption 0(55) 26(80) 32(82)  Shoulder abduction WFL 58(75) 32(54) 32(50) 45(60)  Shoulder adduction         Shoulder extension         Shoulder internal rotation         Shoulder external rotation         Elbow flexion [place] WFL 145 145 145  Elbow extension [facility] WFL 10(0) 10(0) WFL  Wrist flexion WFL TBD [facility] WFL  Wrist extension WFL TBD [facility] WFL  Wrist ulnar deviation  Wrist radial deviation         Wrist pronation         Wrist supination         (Blank rows = not tested)     UPPER EXTREMITY MMT:      MMT Right eval Left eval Left 10/09/2022 Left 10/23/2022 Left 12/02/2022  Shoulder flexion 4+/5 2+/5 1/5 2-/5 2-/5  Shoulder abduction 4+/5 2+/5 2-/5 2-/5 2-/5  Shoulder adduction         Shoulder extension         Shoulder internal rotation         Shoulder external rotation         Middle trapezius         Lower trapezius         Elbow flexion 4+/5 3+/5 3/5 3/5 3+/5  Elbow extension 4+/5 3+/5 3/5 3/5 3+/5  Wrist flexion 4+/5 TBD 3/5 3/5 3+/5  Wrist extension 4+/5 TBD 3/5 3/5 3+/5  Wrist ulnar deviation         Wrist radial deviation         Wrist pronation         Wrist supination         (Blank rows = not tested)   HAND FUNCTION: Grip strength: Right: 33 lbs; Left: 11 lbs, Lateral pinch: Right: 17 lbs, Left: 2 lbs, and 3 point pinch: Right: 17 lbs, Left: 2 lbs  10/09/2022: Grip strength: Right: 33 lbs; Left: 10 lbs, Lateral pinch: Right: 17 lbs, Left: 1 lbs, and 3 point pinch: Right: 17 lbs, Left: 1 lbs   10/23/2022: Grip strength: Right: 33 lbs; Left: 16 lbs, Lateral pinch: Right: 17 lbs, Left: 1 lbs, and 3 point pinch: Right: 17 lbs, Left: 1 lbs  12/02/2022: Grip strength: Right: 33 lbs; Left: 17 lbs,  Lateral pinch: Right: 17 lbs, Left: 7 lbs, and 3 point pinch: Right: 17 lbs, Left: 3 lbs     COORDINATION:   9 Hole Peg test: Right: 27 sec.  sec; Left: 52 sec.  10/09/2022: 9 Hole Peg test: Right: 27 sec.  sec; Left: 1 min. & 35 sec.  SENSATION: Light touch: WFL Proprioception: WFL   COGNITION: Overall cognitive status: Within functional limits for tasks assessed   VISION: Subjective report: Glasses. Just received a new prescription to increase bifocal strength Baseline vision: Macular Degeneration Visual history: macular degeneration   VISION ASSESSMENT: To be further assessed in functional context  Left sided Awareness      PERCEPTION: Limited left sided awareness     TODAY'S TREATMENT:   Therapeutic Exercise:   Pt. tolerated PROM in all joint ranges of the left shoulder. Shoulder flexion and abduction at the tabletop surface. Pt. worked on [employer], and reciprocal motion using the UBE while seated for 8 min. with no resistance initially, and the resistance gradually increasing. Pt. performed left shoulder flexion, and abduction at the tabletop surface.   Neuromuscular re-education:  Pt. worked on left hand left hand [facility] skills, and translatory movements after  grasping 1" grooved from the tabletop, and storing them in the palm of her hand.      PATIENT EDUCATION: Education details: Compensatory strategies during typing. Person educated: Patient Education method: verbal cues Education comprehension: verbalized understanding and pt does not plan to cook unless son is present.     HOME EXERCISE PROGRAM:  Reviewed home activities to enhance left hand digit extension  during typing skills.   GOALS: Goals reviewed with patient?  Yes     SHORT TERM GOALS: Target date: 01/13/2023       Pt. Will improve FOTO score by 2 points to reflect improved pt. perceived functional performance Baseline: 12/01/2022: FOTO: 51 10/23/2022: FOTO 49 10/09/2022: FOTO 49, FOTO: 61  TR score: 62  Goal status: INITIAL   LONG TERM GOALS: Target date:02/24/2023  Pt. Will improve left shoulder ROM by 10 degrees to be able able to efficiently apply deodorant Baseline: 12/02/2022: Shoulder flexion: 32(82), Abduction: 45(60)  10/23/2022: Shoulder flexion: 26(80), Abduction: 32(50) 10/09/2022: flexion: 0(55), Abduction: 32(54) Goal status: Improving, but very limited.   2.  Pt. will be able to independently hold and use a blow dryer, and brush for hair care. Baseline: 12/02/2022: Pt. is unable to actively elevate he left shoulder in order to blow dry her hair. 10/23/2022: Pt. continues to be unable to hold a blow dryer. Eval: Pt. Is unable to sustain her BUEs in elevation, and use a blow dryer, and brush. 10/09/2022: Pt. Is unable to hold a blow dryer. Eval: Pt. Is unable to sustain her BUEs in elevation, and use a blow dryer, and brush. Goal status: Ongoing   3.  Pt. Will improve left lateral pinch strength by 3# to be able to independently cut meat. Baseline: 12/02/2022: Left pinch: 7# Pt. has difficulty cutting meat. 10/23/2022: Pt. Continues to present with difficulty cutting meat. 10/09/2022: 1#  Eval: 2#. Pt. has difficulty stabilizing utensil, and food while cutting food. Goal status: Ongoing   4.  Pt. Will improve Left hand [facility] skills to be able to be able to independently, and efficiently manipulate small objects during ADL tasks. Baseline 12/02/2022: TBD  10/23/2022: Pt. Continues to present with difficulty manipulating small objects. 10/09/2022: Left: 1 min & 35 sec.: Eval: Left: 52 sec. Right: 27 sec. Goal status: Ongoing     6.: Pt. will improve with left grip strength to be able to hold and pull up covers. Baseline: 12/02/2022: 17# 10/23/2022: Left: 16# 10/09/2022: Left: 10# Eval:  Left grip strength 11#. Pt. has difficulty holding a full drink  securely with the left hand.             Goal status: Ongoing   7. Pt. Will improve typing speed, and accuracy in preparation for  efficiently typing simple email message. Baseline: 12/02/2022:  Pt. continues to present with limited speed, and accuracy with typing.10/23/2022: Pt. continues to present with limited speed, and accuracy with typing. 10/09/2022: Pt. presents with difficulty typing. Pt. presents with difficulty typing an email. Typing speed, and accuracy TBD             Goal status: INITIAL                                        CLINICAL IMPRESSION:  Pt. she continues to have  stiffness in her left shoulder with limited AROM, and has intermittent pain with reports of no pain last night, or today.  She continues to respond well to use of reciprocal arm movement to loosen up shoulder area in the clinic. Pt. continues to present with limited [facility] skills, and translatory movements of the hand. Pt. was able to grasp, and store objects, however had less difficulty performing translatory movements of the hand, however requires increased time to complete. Pt. presents with difficulty turning the pegs with the tips of her fingers in preparation for placing them into  the grooved pegboard.Pt. continues to benefit from OT services to work towards improving Left UE functioning in order to maximize engagement in, and overall independence with ADLs, and IADL tasks.    PERFORMANCE DEFICITS in functional skills including ADLs, IADLs, coordination, dexterity, sensation, ROM, strength, FMC, decreased knowledge of use of DME, vision, and UE functional use, cognitive skills including attention, and psychosocial skills including coping strategies, environmental adaptation, habits, and routines and behaviors.    IMPAIRMENTS are limiting patient from ADLs, IADLs, and social participation.    COMORBIDITIES may have co-morbidities  that affects occupational performance. Patient will benefit from skilled OT to address above impairments and improve overall function.   MODIFICATION OR ASSISTANCE TO COMPLETE EVALUATION: Min-Moderate modification of  tasks or assist with assess necessary to complete an evaluation.   OT OCCUPATIONAL PROFILE AND HISTORY: Detailed assessment: Review of records and additional review of physical, cognitive, psychosocial history related to current functional performance.   CLINICAL DECISION MAKING: Moderate - several treatment options, min-mod task modification necessary   REHAB POTENTIAL: Good   EVALUATION COMPLEXITY: Moderate      PLAN: OT FREQUENCY: 2x/week   OT DURATION: 12 weeks   PLANNED INTERVENTIONS: self care/ADL training, therapeutic exercise, therapeutic activity, neuromuscular re-education, manual therapy, passive range of motion, and paraffin   RECOMMENDED OTHER SERVICES: ST, and PT   CONSULTED AND AGREED WITH PLAN OF CARE: Patient   PLAN FOR NEXT SESSION:  L hand strengthening and coordination exercises    Olegario Messier, MS, OTR/L   01/08/23, 10:26 AM

## 2023-01-13 ENCOUNTER — Ambulatory Visit: Payer: Medicare HMO | Admitting: Occupational Therapy

## 2023-01-13 ENCOUNTER — Ambulatory Visit: Payer: Medicare HMO | Admitting: Physical Therapy

## 2023-01-13 DIAGNOSIS — M542 Cervicalgia: Secondary | ICD-10-CM

## 2023-01-13 DIAGNOSIS — R262 Difficulty in walking, not elsewhere classified: Secondary | ICD-10-CM

## 2023-01-13 DIAGNOSIS — R278 Other lack of coordination: Secondary | ICD-10-CM

## 2023-01-13 DIAGNOSIS — M6281 Muscle weakness (generalized): Secondary | ICD-10-CM | POA: Diagnosis not present

## 2023-01-13 DIAGNOSIS — R482 Apraxia: Secondary | ICD-10-CM

## 2023-01-13 DIAGNOSIS — M25552 Pain in left hip: Secondary | ICD-10-CM

## 2023-01-13 DIAGNOSIS — R2681 Unsteadiness on feet: Secondary | ICD-10-CM

## 2023-01-13 NOTE — Therapy (Signed)
OUTPATIENT PHYSICAL THERAPY  TREATMENT/ PHYSICAL THERAPY PROGRESS NOTE   Dates of reporting period  12/02/2022   to   01/13/2023       Patient Name: Alyssa Mayo MRN: [id number] DOB:10-04-1945, 77 y.o., female Today's Date: 12/04/2022   END OF SESSION:    PT End of Session - 01/13/23 0933     Visit Number 30    Number of Visits 36    Date for PT Re-Evaluation 01/22/23    Authorization Type [employer]    Authorization Time Period 10/30/22-01/22/23    Progress Note Due on Visit 30    PT Start Time 0933    PT Stop Time 1015    PT Time Calculation (min) 42 min    Equipment Utilized During Treatment Gait belt    Activity Tolerance Patient tolerated treatment well;No increased pain    Behavior During Therapy [facility] for tasks assessed/performed                     Past Medical History:  Diagnosis Date   Abnormal levels of other serum enzymes 07/17/2015   Arthritis     Constipation 07/11/2015   COVID-19 03/06/2021   Diabetes mellitus without complication (HCC)     Elevated liver enzymes     Fatty liver     Generalized abdominal pain 07/11/2015   Hyperlipidemia     Hypertension     Lifelong obesity     Macular degeneration     Morbid obesity due to excess calories (HCC) 07/11/2015   Other fatigue 07/11/2015   Sleep apnea     Spinal headache           Past Surgical History:  Procedure Laterality Date   ABDOMINAL HYSTERECTOMY       APPENDECTOMY       CATARACT EXTRACTION       CERVICAL LAMINECTOMY   07/21/1985   CESAREAN SECTION       COLONOSCOPY   07/21/2012    diverticulosis, [facility] Dr. [name]    COLONOSCOPY WITH PROPOFOL N/A 02/04/2019    Procedure: COLONOSCOPY WITH PROPOFOL;  Surgeon: [name], MD;  Location: [facility] ENDOSCOPY;  Service: Endoscopy;  Laterality: N/A;   COLONOSCOPY WITH PROPOFOL N/A 03/18/2021    Procedure: COLONOSCOPY WITH PROPOFOL;  Surgeon: [name], MD;  Location: [facility] ENDOSCOPY;  Service: Endoscopy;  Laterality: N/A;   DM   DIAGNOSTIC LAPAROSCOPY       ESOPHAGOGASTRODUODENOSCOPY (EGD) WITH PROPOFOL N/A 02/04/2019    Procedure: ESOPHAGOGASTRODUODENOSCOPY (EGD) WITH PROPOFOL;  Surgeon: [name], MD;  Location: [facility] ENDOSCOPY;  Service: Endoscopy;  Laterality: N/A;   ESOPHAGOGASTRODUODENOSCOPY (EGD) WITH PROPOFOL N/A 03/18/2021    Procedure: ESOPHAGOGASTRODUODENOSCOPY (EGD) WITH PROPOFOL;  Surgeon: [name], MD;  Location: [facility] ENDOSCOPY;  Service: Endoscopy;  Laterality: N/A;   LOOP RECORDER INSERTION N/A 08/05/2021    Procedure: LOOP RECORDER INSERTION;  Surgeon: [name], MD;  Location: [facility] INVASIVE CV LAB;  Service: Cardiovascular;  Laterality: N/A;   SPINE SURGERY       TUBAL LIGATION            Patient Active Problem List    Diagnosis Date Noted   UTI (urinary tract infection) 09/01/2022   COVID-19 virus infection 09/01/2022   Chronic diastolic CHF (congestive heart failure) (HCC) 09/01/2022   Chronic kidney disease, stage 3a (HCC) 09/01/2022   Obesity (BMI 30-39.9) 09/01/2022   Paroxysmal atrial fibrillation (HCC) 08/08/2022   Hypercoagulable state due to paroxysmal  atrial fibrillation (HCC) 08/08/2022   Cerebral edema (HCC) 08/07/2021   OSA (obstructive sleep apnea) 08/07/2021   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   CVA (cerebral vascular accident) (HCC) 08/01/2021   GERD (gastroesophageal reflux disease) 07/20/2019   Advanced care planning/counseling discussion 12/17/2016   Skin lesions, generalized 11/13/2016   Chronic right hip pain 06/17/2016   Vitamin D deficiency 09/26/2015   Diastasis recti 08/08/2015   Elevated serum GGT level 07/24/2015   Essential hypertension 07/17/2015   Fatty liver 07/17/2015   Elevated alkaline phosphatase level 07/17/2015   Elevated serum glutamic pyruvic transaminase (SGPT) level 07/17/2015   Abnormal finding on EKG 07/11/2015   Morbid obesity (HCC) 07/11/2015   Colon, diverticulosis 07/11/2015   Abdominal wall  hernia 07/11/2015   DM (diabetes mellitus), type 2 with neurological complications (HCC)     Hyperlipidemia        PCP: [name], MD   REFERRING PROVIDER: [name], MD & Edilia Bo, [place]   REFERRING DIAG: [phone] (ICD-10-CM) - Cerebral infarction due to unspecified occlusion or stenosis of unspecified precerebral arteries, Cervicalgia with degenerative disc   THERAPY DIAG:  Muscle weakness (generalized)   Other lack of coordination   Difficulty in walking, not elsewhere classified   Cervical pain   Pain in left hip   Painful cervical ROM   Apraxia   Unsteadiness on feet   Rationale for Evaluation and Treatment: Rehabilitation   ONSET DATE: 09/03/22   SUBJECTIVE:                                                                                                                                                                                                          SUBJECTIVE STATEMENT:  Pt still waiting to hear back about MRI for left shoulder. No pain today.    PERTINENT HISTORY:  Pt is a 77 yo female with RMCA CVA infarction with hemorrhagic transformation 08/01/2021, with inpatient rehab 08/07/2021-08/22/2021 followed by [facility] therapy until June. Pt now currently being seen for outpatient OT and ST services. Pt reports limitations in stamina persist. Other PMH is significant for HTN, HLD, macular degeneration, DM, obesity, sleep apnea, arthritis, chronic R hip pain, vitamin D deficiency, diastasis recti, abdominal wall hernia, hx of abdominal hysterectomy, appendectomy, spine surgery (years ago).    PAIN:  Are you having pain? Yes: NPRS scale: 0/10 Pain location:  L hip/L knee  Pain description: dull ache Aggravating factors: sitting Relieving factors: laying down   PRECAUTIONS: None   WEIGHT BEARING RESTRICTIONS: No   FALLS:  Has  patient fallen in last 6 months? Yes. Number of falls 2   LIVING ENVIRONMENT: Lives with: lives with their son Lives in:  House/apartment Stairs: Yes: Internal: 13 steps; doesn't go up to the second floor and External: 4 steps; can reach both Has following equipment at home: Single point cane and Walker - 2 wheeled   OCCUPATION: Retired   PLOF: Independent with basic ADLs   PATIENT GOALS: "Get stronger. And reduce fall risk. Reduce reliance on cane"    OBJECTIVE:        TODAY'S TREATMENT: DATE: 01/13/23    Nustep BLE only. X 4 min levl 1-3 instruction to keep SPM >50 throughout.   Manual therapy for L UT and levator scapulae.  STM Performed x 4 min sitting with trigger point release as tolerated by pt. Supine UT stretch 2 x 30 sec bil. Cervical rotation with contract 5 sec/relax x20sec, 2 x 4 bil with increased rotation on the L noted from 25 to 35 deg.   6 Min Walk Test:  Instructed patient to ambulate as quickly and as safely as possible for 6 minutes using LRAD. Patient was allowed to take standing rest breaks without stopping the test, but if the patient required a sitting rest break the clock would be stopped and the test would be over.  Results: 902 feet using no AD no this day  with supervision assist . Results indicate that the patient has reduced endurance with ambulation compared to age matched norms.  Age Matched Norms: 37-69 yo M: 71 F: 45, 66-79 yo M: 62 F: 471, 20-89 yo M: 417 F: 392 MDC: 58.21 meters (190.98 feet) or 50 meters (ANPTA Core Set of Outcome Measures for Adults with Neurologic Conditions, 2018)    PATIENT EDUCATION:  Education details: Pt educated throughout session about proper posture and technique with exercises. Improved exercise technique, movement at target joints, use of target muscles after min to mod verbal, visual, tactile cues.   Person educated: Patient and   Education method: Explanation Education comprehension: verbalized understanding   HOME EXERCISE PROGRAM:   Access Code: [email or web address] URL: [email or web address] Date: 01/01/2023 Prepared  by: Grier Rocher  Exercises - Sit to Stand with Resistance Around Legs  - 1 x daily - 5 x weekly - 3 sets - 10 reps - Seated Hip Adduction Isometrics with Ball  - 1 x daily - 7 x weekly - 3 sets - 10 reps - Standing March with Counter Support  - 1 x daily - 5 x weekly - 3 sets - 10 reps - Seated Hip Abduction with Resistance  - 1 x daily - 5 x weekly - 3 sets - 10 reps - 3 hold  Access Code: [email or web address] URL: [email or web address] Date: 09/18/2022 Prepared by: Tomasa Hose   Exercises - Seated Upper Trapezius Stretch  - 1 x daily - 7 x weekly - 3 sets - 10 reps - Seated Levator Scapulae Stretch  - 1 x daily - 7 x weekly - 3 sets - 10 reps - Seated Scapular Retraction  - 1 x daily - 7 x weekly - 3 sets - 10 reps     ASSESSMENT:   CLINICAL IMPRESSION: Pt continues to put forth great effort throughout the session.  Pt reports significant increase in neck pain over the last could day. Increased to as high as 6/10, but 3-4/10 at rest on this day. PT session focused on pain management and improved muscle extensibility and eduction for completion of HEP for cervical pain management  with UT and levator scapulae stretching. Demonstrates decreased distantance on WT, but performed without AD on this day. Pt continues to demonstrate severe Lack of shoulder ROM with inability to perform shoulder flexion beyond ~10 deg without significant pain. Pt may be ready to d/c from PT services in the next few week with review education on HEP for management and cervical pain eduction on use of community services to maintain gains and instrength and balance.  Pt will continue to benefit from skilled therapy to address remaining deficits in order to improve overall QoL and return to PLOF.         OBJECTIVE IMPAIRMENTS: decreased activity tolerance, decreased cognition, decreased ROM, decreased strength, decreased safety awareness, hypomobility, and pain.    ACTIVITY LIMITATIONS: carrying, lifting,  bending, sitting, standing, squatting, and reach over head   PARTICIPATION LIMITATIONS: meal prep, cleaning, laundry, driving, shopping, community activity, and yard work   PERSONAL FACTORS: Age, Behavior pattern, Past/current experiences, Time since onset of injury/illness/exacerbation, and 3+ comorbidities: arthritis, DM2, HTN, Obesity,   are also affecting patient's functional outcome.    REHAB POTENTIAL: Good   CLINICAL DECISION MAKING: Stable/uncomplicated   EVALUATION COMPLEXITY: Low     GOALS: Goals reviewed with patient? Yes   SHORT TERM GOALS: Target date: 12/11/2022       Pt will be independent with HEP in order to improve strength of BLE and ROM of the neck in order to return to PLOF. Baseline: provided on neck visit Goal status: in progress     LONG TERM GOALS: Target date: 01/22/2023     Patient will demonstrate improved function as evidenced by a score of 68 on FOTO measure for full participation in activities at home and in the community. Baseline: 65. 10/23/22: 52. 5/15 56 for neck and CVA. 6/24: neck: 45 Goal status: IN PROGRESS   2.  Pt to improve NDI by 5 points or 10% in order to demonstrate a reduction in overall disability caused by neck pain at this time. Baseline: 15/50 or 30% disability 4/11 27/50  Goal status: NOT MET   3.  Pt to demonstrate improved cervical rotation to 60 deg bilaterally in order to perform tasks around the house that require turning of the head. Baseline: R/L Cervical Rotation: 49/30. 4/424 R:53. L:42  5/14: R45deg. L50deg 6/25: R 48deg. L 38 deg.  Goal status: IN PROGRESS   4.  Pt to report 0/10 pain in the neck following light household chores at home in order to demonstrate an overall reduction in pain and an improvement in QoL. Baseline: 4/10 at rest 5/14 0/10 in neck. 6/25 3-4/10  Goal status: MET   5.  Pt will improve gait speed to <1.81m/s to indicate improved community mobility and reduced fall risk  Baseline: 0.72m/s   Goal status: achieved     6. Pt will increase 6 min walk test >1034ft  Baseline: 927ft with SPC Goal status: in progress.    PLAN:   PT FREQUENCY: 1-2x/week   PT DURATION: 12 weeks   PLANNED INTERVENTIONS: Therapeutic exercises, Therapeutic activity, Neuromuscular re-education, Balance training, Gait training, Patient/Family education, Self Care, Joint mobilization, Joint manipulation, Stair training, Vestibular training, Canalith repositioning, Visual/preceptual remediation/compensation, DME instructions, Dry Needling, Cognitive remediation, Electrical stimulation, Spinal manipulation, Spinal mobilization, Cryotherapy, Moist heat, Traction, Ultrasound, Manual therapy, and Re-evaluation       PLAN FOR NEXT SESSION:   Grier Rocher PT, DPT  Physical Therapist - [place]  [facility]  10:59 AM  01/13/23   

## 2023-01-13 NOTE — Therapy (Signed)
Occupational Therapy Progress/Recertification Note  Dates of reporting period  12/05/2022   to   01/13/2023    Patient Name: Alyssa Mayo MRN: [id number] DOB:[birth year], 77 y.o., female Today's Date: 03/14/2022   PCP: [name], MD REFERRING PROVIDER: [name], NP     OT End of Session - 01/13/23 1017     Visit Number 30    Number of Visits 48    Authorization Type Progress reporting period starting 09/16/2022    OT Start Time 1017    OT Stop Time 1100    OT Time Calculation (min) 43 min    Activity Tolerance Patient tolerated treatment well    Behavior During Therapy [facility] for tasks assessed/performed        Re-evaluation date                                                  04/07/2023                                 Past Medical History:  Diagnosis Date   Abnormal levels of other serum enzymes 07/17/2015   Arthritis     Constipation 07/11/2015   COVID-19 03/06/2021   Diabetes mellitus without complication (HCC)     Elevated liver enzymes     Fatty liver     Generalized abdominal pain 07/11/2015   Hyperlipidemia     Hypertension     Lifelong obesity     Macular degeneration     Morbid obesity due to excess calories (HCC) 07/11/2015   Other fatigue 07/11/2015   Sleep apnea     Spinal headache           Past Surgical History:  Procedure Laterality Date   ABDOMINAL HYSTERECTOMY       APPENDECTOMY       CATARACT EXTRACTION       CERVICAL LAMINECTOMY   07/21/1985   CESAREAN SECTION       COLONOSCOPY   07/21/2012    diverticulosis, [facility] Dr. [name]    COLONOSCOPY WITH PROPOFOL N/A 02/04/2019    Procedure: COLONOSCOPY WITH PROPOFOL;  Surgeon: [name], MD;  Location: [facility] ENDOSCOPY;  Service: Endoscopy;  Laterality: N/A;   COLONOSCOPY WITH PROPOFOL N/A 03/18/2021    Procedure: COLONOSCOPY WITH PROPOFOL;  Surgeon: [name], MD;  Location: [facility] ENDOSCOPY;  Service: Endoscopy;  Laterality: N/A;  DM   DIAGNOSTIC LAPAROSCOPY        ESOPHAGOGASTRODUODENOSCOPY (EGD) WITH PROPOFOL N/A 02/04/2019    Procedure: ESOPHAGOGASTRODUODENOSCOPY (EGD) WITH PROPOFOL;  Surgeon: [name], MD;  Location: [facility] ENDOSCOPY;  Service: Endoscopy;  Laterality: N/A;   ESOPHAGOGASTRODUODENOSCOPY (EGD) WITH PROPOFOL N/A 03/18/2021    Procedure: ESOPHAGOGASTRODUODENOSCOPY (EGD) WITH PROPOFOL;  Surgeon: [name], MD;  Location: [facility] ENDOSCOPY;  Service: Endoscopy;  Laterality: N/A;   LOOP RECORDER INSERTION N/A 08/05/2021    Procedure: LOOP RECORDER INSERTION;  Surgeon: [name], MD;  Location: [facility] INVASIVE CV LAB;  Service: Cardiovascular;  Laterality: N/A;   SPINE SURGERY       TUBAL LIGATION            Patient Active Problem List    Diagnosis Date Noted   Paroxysmal atrial fibrillation (HCC) 08/08/2022   Hypercoagulable state due to paroxysmal atrial  fibrillation (HCC) 08/08/2022   Cerebral edema (HCC) 08/07/2021   OSA (obstructive sleep apnea) 08/07/2021   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   CVA (cerebral vascular accident) (HCC) 08/01/2021   GERD (gastroesophageal reflux disease) 07/20/2019   Advanced care planning/counseling discussion 12/17/2016   Skin lesions, generalized 11/13/2016   Chronic right hip pain 06/17/2016   Vitamin D deficiency 09/26/2015   Diastasis recti 08/08/2015   Elevated serum GGT level 07/24/2015   Essential hypertension 07/17/2015   Fatty liver 07/17/2015   Elevated alkaline phosphatase level 07/17/2015   Elevated serum glutamic pyruvic transaminase (SGPT) level 07/17/2015   Abnormal finding on EKG 07/11/2015   Morbid obesity (HCC) 07/11/2015   Colon, diverticulosis 07/11/2015   Abdominal wall hernia 07/11/2015   DM (diabetes mellitus), type 2 with neurological complications (HCC)     Hyperlipidemia        ONSET DATE: 08/01/2021   REFERRING DIAG: CVA   THERAPY DIAG:  Muscle weakness (generalized)   Rationale for Evaluation and Treatment Rehabilitation    SUBJECTIVE:    SUBJECTIVE STATEMENT:    Pt. Reports that she continues to wait  to hear from her Ortho MD's office about whether or not she can have an MRI.  Pt accompanied by: self   PERTINENT HISTORY:  Pt. is a 77 y.o. female who had a unexpectedly hospitalized from 2/12-2/14/2024 for COVID-19, and a UTI.  Pt. 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 Pt. attended inpatient rehab from 08/07/2021-08/22/2021. Pt. received Home health therapy services this past spring.  Pt. has recently had an assessment through driver rehabilitation services at [facility] with recommendations for referrals for  outpatient OT/PT, and ST services. Pt. PMHx includes: HTN, Hyperlipidemia, Macular degeneration, DM, and Obesity.   PRECAUTIONS: None   WEIGHT BEARING RESTRICTIONS No   PAIN:  Are you having pain?  3/10 pain   FALLS: Has patient fallen in last 6 months? Yes.   LIVING ENVIRONMENT: Lives with: lives with their family  Son  [name] Lives in: House/apartment Main living are on one floor Stairs:  2 steps to enter Has following equipment at home: Single point cane, Wheelchair (manual), shower chair, Grab bars, bed rail, and rubber mat.   PLOF: Independent   PATIENT GOALS: To be able to Drive, and cook again   OBJECTIVE:    HAND DOMINANCE: Right    ADLs:  Transfers/ambulation related to ADLs: Independent Eating: Independent Grooming: [employer] Pt. reports being unable to use her left hand to perform haircare.  UB Dressing: Independent pullover shirt, independent now with fastening a bra LB Dressing: Independent donning pants socks, and slide on shoes Toileting: Independent Bathing: Independent Tub Shower transfers: Walk-in shower Independent now     IADLs: Shopping: Needs to be accompanied to the grocery store. Light housekeeping: Do  Meal Prep:  Is able to perform light meal prep Community mobility: Relies on son, and friend Medication  management: Son sets up Mining engineer management:TBD Handwriting: TBD   MOBILITY STATUS: Hx of falls out of bed     ACTIVITY TOLERANCE: Activity tolerance:  10-20 min. Before rest break   FUNCTIONAL OUTCOME MEASURES: FOTO: 61  TR score: 62  10/09/2022: FOTO: 49   UPPER EXTREMITY ROM      Active ROM Right eval Left eval Left  10/09/2022 Left 10/23/22 Left 12/02/2022 Left 01/13/2023  Shoulder flexion WFL 54(74) scaption 0(55) 26(80) 32(82) 35(82)  Shoulder abduction WFL 58(75) 32(54) 32(50) 45(60) 47(70)  Shoulder adduction  Shoulder extension          Shoulder internal rotation          Shoulder external rotation          Elbow flexion [facility] WFL 145 145 145 145  Elbow extension [facility] WFL 10(0) 10(0) WFL WFL  Wrist flexion WFL TBD [facility] WFL  Wrist extension WFL TBD [facility] WFL  Wrist ulnar deviation          Wrist radial deviation          Wrist pronation          Wrist supination          (Blank rows = not tested)     UPPER EXTREMITY MMT:      MMT Right eval Left eval Left 10/09/2022 Left 10/23/2022 Left 12/02/2022 Left 01/13/2023  Shoulder flexion 4+/5 2+/5 1/5 2-/5 2-/5 2-/5  Shoulder abduction 4+/5 2+/5 2-/5 2-/5 2-/5 2-/5  Shoulder adduction          Shoulder extension          Shoulder internal rotation          Shoulder external rotation          Middle trapezius          Lower trapezius          Elbow flexion 4+/5 3+/5 3/5 3/5 3+/5 3+/5  Elbow extension 4+/5 3+/5 3/5 3/5 3+/5 3+/5  Wrist flexion 4+/5 TBD 3/5 3/5 3+/5 3+/5  Wrist extension 4+/5 TBD 3/5 3/5 3+/5 3+/5  Wrist ulnar deviation          Wrist radial deviation          Wrist pronation          Wrist supination          (Blank rows = not tested)   HAND FUNCTION: Grip strength: Right: 33 lbs; Left: 11 lbs, Lateral pinch: Right: 17 lbs, Left: 2 lbs, and 3 point pinch: Right: 17 lbs, Left: 2 lbs  10/09/2022: Grip strength: Right: 33 lbs; Left: 10 lbs, Lateral pinch: Right: 17  lbs, Left: 1 lbs, and 3 point pinch: Right: 17 lbs, Left: 1 lbs   10/23/2022: Grip strength: Right: 33 lbs; Left: 16 lbs, Lateral pinch: Right: 17 lbs, Left: 1 lbs, and 3 point pinch: Right: 17 lbs, Left: 1 lbs  12/02/2022: Grip strength: Right: 33 lbs; Left: 17 lbs, Lateral pinch: Right: 17 lbs, Left: 7 lbs, and 3 point pinch: Right: 17 lbs, Left: 3 lbs   01/13/2023: Grip strength: Right: 33 lbs; Left: 15 lbs, Lateral pinch: Right: 17 lbs, Left: 9 lbs, and 3 point pinch: Right: 17 lbs, Left: 7 lbs     COORDINATION:   9 Hole Peg test: Right: 27 sec.  sec; Left: 52 sec.  10/09/2022: 9 Hole Peg test: Right: 27 sec.  sec; Left: 1 min. & 35 sec.  01/13/2023: 9 Hole Peg test: Right: 27 sec.  sec; Left: 51. Sec.  SENSATION: Light touch: WFL Proprioception: WFL   COGNITION: Overall cognitive status: Within functional limits for tasks assessed   VISION: Subjective report: Glasses. Just received a new prescription to increase bifocal strength Baseline vision: Macular Degeneration Visual history: macular degeneration   VISION ASSESSMENT: To be further assessed in functional context  Left sided Awareness      PERCEPTION: Limited left sided awareness     TODAY'S TREATMENT:     Therapeutic Exercise:   Pt. tolerated PROM  in all joint ranges of the left shoulder. Shoulder flexion and abduction at the tabletop surface. Pt. worked on [employer], and reciprocal motion using the UBE while seated for 8 min. with no resistance initially, and the resistance gradually increasing. Pt. performed left shoulder flexion, and abduction at the tabletop surface.   Measurements were obtained, and goals were reviewed with the Pt.    PATIENT EDUCATION: Education details: Compensatory strategies during typing. Person educated: Patient Education method: verbal cues Education comprehension: verbalized understanding and pt does not plan to cook unless son is present.     HOME EXERCISE PROGRAM:   Reviewed home activities to enhance left hand digit extension  during typing skills.   GOALS: Goals reviewed with patient? Yes     SHORT TERM GOALS: Target date: 02/24/2023         Pt. Will improve FOTO score by 2 points to reflect improved pt. perceived functional performance Baseline: 01/13/2023: 49 12/01/2022: FOTO: 51 10/23/2022: FOTO 49 10/09/2022: FOTO 49, FOTO: 61 TR score: 62  Goal status: INITIAL   LONG TERM GOALS: Target date:04/07/2023  Pt. Will improve left shoulder ROM by 10 degrees to be able able to efficiently apply deodorant Baseline: 01/13/2023:  Shoulder flexion: 35(82), Abduction: 47(70) 12/02/2022: Shoulder flexion: 32(82), Abduction: 45(60)  10/23/2022: Shoulder flexion: 26(80), Abduction: 32(50) 10/09/2022: flexion: 0(55), Abduction: 32(54) Goal status: Improving, but very limited.   2.  Pt. will be able to independently hold and use a blow dryer, and brush for hair care. Baseline: 01/13/2023: Pt. Can hold the hair dryer in the left hand, however is unable to reach up to dry it, and has difficulty using a brush. 12/02/2022: Pt. is unable to actively elevate he left shoulder in order to blow dry her hair. 10/23/2022: Pt. continues to be unable to hold a blow dryer. Eval: Pt. Is unable to sustain her BUEs in elevation, and use a blow dryer, and brush. 10/09/2022: Pt. Is unable to hold a blow dryer. Eval: Pt. Is unable to sustain her BUEs in elevation, and use a blow dryer, and brush. Goal status: Ongoing   3.  Pt. Will improve left lateral pinch strength by 3# to be able to independently cut meat. Baseline: 01/13/2023: Left lateral pinch strength: 9#  12/02/2022: Left pinch: 7# Pt. has difficulty cutting meat. 10/23/2022: Pt. Continues to present with difficulty cutting meat. 10/09/2022: 1#  Eval: 2#. Pt. has difficulty stabilizing utensil, and food while cutting food. Goal status: Ongoing   4.  Pt. Will improve Left hand [facility] skills to be able to be able to independently, and  efficiently manipulate small objects during ADL tasks. Baseline 01/13/2023: 51 sec. 12/02/2022: TBD  10/23/2022: Pt. Continues to present with difficulty manipulating small objects. 10/09/2022: Left: 1 min & 35 sec.: Eval: Left: 52 sec. Right: 27 sec. Goal status: Ongoing     6.: Pt. will improve with left grip strength to be able to hold and pull up covers. Baseline: 01/13/2023: left  grip strength: 15# 12/02/2022: 17# 10/23/2022: Left: 16# 10/09/2022: Left: 10# Eval:  Left grip strength 11#. Pt. has difficulty holding a full drink  securely with the left hand.             Goal status: Ongoing   7. Pt. Will improve typing speed, and accuracy in preparation for efficiently typing simple email message. Baseline: 01/13/2023: TBD 12/02/2022:  Pt. continues to present with limited speed, and accuracy with typing.10/23/2022: Pt. continues to present with limited speed, and accuracy with  typing. 10/09/2022: Pt. presents with difficulty typing. Pt. presents with difficulty typing an email. Typing speed, and accuracy TBD             Goal status: INITIAL                                        CLINICAL IMPRESSION:  Measurements were obtained, and goals were reviewed with the Pt. Pt. has made progress with left shoulder ROM, left pinch strength, and left hand [facility] skills. Pt. reports having had 10/10 pain over the weekend, however reports 3/10 pain today. Pt. presents with increased left shoulder stiffness upon arrival, and presented with less stiffness following treatment, however left shoulder ROM is still very limited. Pt. Continues to present with difficulty using the left hand during hair care, applying deodorant, cutting meat, pulling up covers, and typing an email message. Pt.'s FOTO score is 49. Pt. continues to benefit from OT services to work towards improving Left UE functioning in order to maximize engagement in, and overall independence with ADLs, and IADL tasks.    PERFORMANCE DEFICITS in functional skills  including ADLs, IADLs, coordination, dexterity, sensation, ROM, strength, FMC, decreased knowledge of use of DME, vision, and UE functional use, cognitive skills including attention, and psychosocial skills including coping strategies, environmental adaptation, habits, and routines and behaviors.    IMPAIRMENTS are limiting patient from ADLs, IADLs, and social participation.    COMORBIDITIES may have co-morbidities  that affects occupational performance. Patient will benefit from skilled OT to address above impairments and improve overall function.   MODIFICATION OR ASSISTANCE TO COMPLETE EVALUATION: Min-Moderate modification of tasks or assist with assess necessary to complete an evaluation.   OT OCCUPATIONAL PROFILE AND HISTORY: Detailed assessment: Review of records and additional review of physical, cognitive, psychosocial history related to current functional performance.   CLINICAL DECISION MAKING: Moderate - several treatment options, min-mod task modification necessary   REHAB POTENTIAL: Good   EVALUATION COMPLEXITY: Moderate      PLAN: OT FREQUENCY: 2x/week   OT DURATION: 12 weeks   PLANNED INTERVENTIONS: self care/ADL training, therapeutic exercise, therapeutic activity, neuromuscular re-education, manual therapy, passive range of motion, and paraffin   RECOMMENDED OTHER SERVICES: ST, and PT   CONSULTED AND AGREED WITH PLAN OF CARE: Patient   PLAN FOR NEXT SESSION:  L hand strengthening and coordination exercises    Olegario Messier, MS, OTR/L   01/13/23, 10:38 AM

## 2023-01-15 ENCOUNTER — Ambulatory Visit: Payer: Medicare HMO | Admitting: Occupational Therapy

## 2023-01-15 ENCOUNTER — Ambulatory Visit: Payer: Medicare HMO | Admitting: Physical Therapy

## 2023-01-15 DIAGNOSIS — R278 Other lack of coordination: Secondary | ICD-10-CM

## 2023-01-15 DIAGNOSIS — M6281 Muscle weakness (generalized): Secondary | ICD-10-CM | POA: Diagnosis not present

## 2023-01-15 DIAGNOSIS — R2681 Unsteadiness on feet: Secondary | ICD-10-CM

## 2023-01-15 DIAGNOSIS — M25552 Pain in left hip: Secondary | ICD-10-CM

## 2023-01-15 DIAGNOSIS — M542 Cervicalgia: Secondary | ICD-10-CM

## 2023-01-15 DIAGNOSIS — R262 Difficulty in walking, not elsewhere classified: Secondary | ICD-10-CM

## 2023-01-15 DIAGNOSIS — R482 Apraxia: Secondary | ICD-10-CM

## 2023-01-15 NOTE — Therapy (Signed)
Occupational Therapy Neuro Treatment Note     Patient Name: Alyssa Mayo MRN: [id number] DOB:1945-12-17, 77 y.o., female Today's Date: 03/14/2022   PCP: [name], MD REFERRING PROVIDER: [name], NP     OT End of Session - 01/15/23 1049     Visit Number 31    Number of Visits 48    Date for OT Re-Evaluation 04/07/23    Authorization Type Progress reporting period starting 09/16/2022    OT Start Time 1022    OT Stop Time 1100    OT Time Calculation (min) 38 min    Activity Tolerance Patient tolerated treatment well    Behavior During Therapy [facility] for tasks assessed/performed                                        Past Medical History:  Diagnosis Date   Abnormal levels of other serum enzymes 07/17/2015   Arthritis     Constipation 07/11/2015   COVID-19 03/06/2021   Diabetes mellitus without complication (HCC)     Elevated liver enzymes     Fatty liver     Generalized abdominal pain 07/11/2015   Hyperlipidemia     Hypertension     Lifelong obesity     Macular degeneration     Morbid obesity due to excess calories (HCC) 07/11/2015   Other fatigue 07/11/2015   Sleep apnea     Spinal headache           Past Surgical History:  Procedure Laterality Date   ABDOMINAL HYSTERECTOMY       APPENDECTOMY       CATARACT EXTRACTION       CERVICAL LAMINECTOMY   07/21/1985   CESAREAN SECTION       COLONOSCOPY   07/21/2012    diverticulosis, [facility] Dr. [name]    COLONOSCOPY WITH PROPOFOL N/A 02/04/2019    Procedure: COLONOSCOPY WITH PROPOFOL;  Surgeon: [name], MD;  Location: [facility] ENDOSCOPY;  Service: Endoscopy;  Laterality: N/A;   COLONOSCOPY WITH PROPOFOL N/A 03/18/2021    Procedure: COLONOSCOPY WITH PROPOFOL;  Surgeon: [name], MD;  Location: [facility] ENDOSCOPY;  Service: Endoscopy;  Laterality: N/A;  DM   DIAGNOSTIC LAPAROSCOPY       ESOPHAGOGASTRODUODENOSCOPY (EGD) WITH PROPOFOL N/A 02/04/2019    Procedure:  ESOPHAGOGASTRODUODENOSCOPY (EGD) WITH PROPOFOL;  Surgeon: [name], MD;  Location: [facility] ENDOSCOPY;  Service: Endoscopy;  Laterality: N/A;   ESOPHAGOGASTRODUODENOSCOPY (EGD) WITH PROPOFOL N/A 03/18/2021    Procedure: ESOPHAGOGASTRODUODENOSCOPY (EGD) WITH PROPOFOL;  Surgeon: [name], MD;  Location: [facility] ENDOSCOPY;  Service: Endoscopy;  Laterality: N/A;   LOOP RECORDER INSERTION N/A 08/05/2021    Procedure: LOOP RECORDER INSERTION;  Surgeon: [name], MD;  Location: [facility] INVASIVE CV LAB;  Service: Cardiovascular;  Laterality: N/A;   SPINE SURGERY       TUBAL LIGATION            Patient Active Problem List    Diagnosis Date Noted   Paroxysmal atrial fibrillation (HCC) 08/08/2022   Hypercoagulable state due to paroxysmal atrial fibrillation (HCC) 08/08/2022   Cerebral edema (HCC) 08/07/2021   OSA (obstructive sleep apnea) 08/07/2021   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   CVA (cerebral vascular accident) (HCC) 08/01/2021   GERD (gastroesophageal reflux disease) 07/20/2019   Advanced care planning/counseling discussion 12/17/2016   Skin lesions, generalized 11/13/2016  Chronic right hip pain 06/17/2016   Vitamin D deficiency 09/26/2015   Diastasis recti 08/08/2015   Elevated serum GGT level 07/24/2015   Essential hypertension 07/17/2015   Fatty liver 07/17/2015   Elevated alkaline phosphatase level 07/17/2015   Elevated serum glutamic pyruvic transaminase (SGPT) level 07/17/2015   Abnormal finding on EKG 07/11/2015   Morbid obesity (HCC) 07/11/2015   Colon, diverticulosis 07/11/2015   Abdominal wall hernia 07/11/2015   DM (diabetes mellitus), type 2 with neurological complications (HCC)     Hyperlipidemia        ONSET DATE: 08/01/2021   REFERRING DIAG: CVA   THERAPY DIAG:  Muscle weakness (generalized)   Rationale for Evaluation and Treatment Rehabilitation   SUBJECTIVE:    SUBJECTIVE STATEMENT:    Pt. Reports that she continues to  wait  to hear from her Ortho MD's office about whether or not she can have an MRI.  Pt accompanied by: self   PERTINENT HISTORY:  Pt. is a 77 y.o. female who had a unexpectedly hospitalized from 2/12-2/14/2024 for COVID-19, and a UTI.  Pt. 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 Pt. attended inpatient rehab from 08/07/2021-08/22/2021. Pt. received Home health therapy services this past spring.  Pt. has recently had an assessment through driver rehabilitation services at [facility] with recommendations for referrals for  outpatient OT/PT, and ST services. Pt. PMHx includes: HTN, Hyperlipidemia, Macular degeneration, DM, and Obesity.   PRECAUTIONS: None   WEIGHT BEARING RESTRICTIONS No   PAIN:  Are you having pain?  3/10 pain   FALLS: Has patient fallen in last 6 months? Yes.   LIVING ENVIRONMENT: Lives with: lives with their family  Son  [name] Lives in: House/apartment Main living are on one floor Stairs:  2 steps to enter Has following equipment at home: Single point cane, Wheelchair (manual), shower chair, Grab bars, bed rail, and rubber mat.   PLOF: Independent   PATIENT GOALS: To be able to Drive, and cook again   OBJECTIVE:    HAND DOMINANCE: Right    ADLs:  Transfers/ambulation related to ADLs: Independent Eating: Independent Grooming: [employer] Pt. reports being unable to use her left hand to perform haircare.  UB Dressing: Independent pullover shirt, independent now with fastening a bra LB Dressing: Independent donning pants socks, and slide on shoes Toileting: Independent Bathing: Independent Tub Shower transfers: Walk-in shower Independent now     IADLs: Shopping: Needs to be accompanied to the grocery store. Light housekeeping: Do  Meal Prep:  Is able to perform light meal prep Community mobility: Relies on son, and friend Medication management: Son sets up Mining engineer management:TBD Handwriting: TBD    MOBILITY STATUS: Hx of falls out of bed     ACTIVITY TOLERANCE: Activity tolerance:  10-20 min. Before rest break   FUNCTIONAL OUTCOME MEASURES: FOTO: 61  TR score: 62  10/09/2022: FOTO: 49   UPPER EXTREMITY ROM      Active ROM Right eval Left eval Left  10/09/2022 Left 10/23/22 Left 12/02/2022 Left 01/13/2023  Shoulder flexion WFL 54(74) scaption 0(55) 26(80) 32(82) 35(82)  Shoulder abduction WFL 58(75) 32(54) 32(50) 45(60) 47(70)  Shoulder adduction          Shoulder extension          Shoulder internal rotation          Shoulder external rotation          Elbow flexion [facility] WFL 145 145 145 145  Elbow extension [facility]  WFL 10(0) 10(0) WFL WFL  Wrist flexion WFL TBD [facility] WFL  Wrist extension WFL TBD [facility] WFL  Wrist ulnar deviation          Wrist radial deviation          Wrist pronation          Wrist supination          (Blank rows = not tested)     UPPER EXTREMITY MMT:      MMT Right eval Left eval Left 10/09/2022 Left 10/23/2022 Left 12/02/2022 Left 01/13/2023  Shoulder flexion 4+/5 2+/5 1/5 2-/5 2-/5 2-/5  Shoulder abduction 4+/5 2+/5 2-/5 2-/5 2-/5 2-/5  Shoulder adduction          Shoulder extension          Shoulder internal rotation          Shoulder external rotation          Middle trapezius          Lower trapezius          Elbow flexion 4+/5 3+/5 3/5 3/5 3+/5 3+/5  Elbow extension 4+/5 3+/5 3/5 3/5 3+/5 3+/5  Wrist flexion 4+/5 TBD 3/5 3/5 3+/5 3+/5  Wrist extension 4+/5 TBD 3/5 3/5 3+/5 3+/5  Wrist ulnar deviation          Wrist radial deviation          Wrist pronation          Wrist supination          (Blank rows = not tested)   HAND FUNCTION: Grip strength: Right: 33 lbs; Left: 11 lbs, Lateral pinch: Right: 17 lbs, Left: 2 lbs, and 3 point pinch: Right: 17 lbs, Left: 2 lbs  10/09/2022: Grip strength: Right: 33 lbs; Left: 10 lbs, Lateral pinch: Right: 17 lbs, Left: 1 lbs, and 3 point pinch: Right: 17 lbs, Left: 1 lbs   10/23/2022:  Grip strength: Right: 33 lbs; Left: 16 lbs, Lateral pinch: Right: 17 lbs, Left: 1 lbs, and 3 point pinch: Right: 17 lbs, Left: 1 lbs  12/02/2022: Grip strength: Right: 33 lbs; Left: 17 lbs, Lateral pinch: Right: 17 lbs, Left: 7 lbs, and 3 point pinch: Right: 17 lbs, Left: 3 lbs   01/13/2023: Grip strength: Right: 33 lbs; Left: 15 lbs, Lateral pinch: Right: 17 lbs, Left: 9 lbs, and 3 point pinch: Right: 17 lbs, Left: 7 lbs     COORDINATION:   9 Hole Peg test: Right: 27 sec.  sec; Left: 52 sec.  10/09/2022: 9 Hole Peg test: Right: 27 sec.  sec; Left: 1 min. & 35 sec.  01/13/2023: 9 Hole Peg test: Right: 27 sec.  sec; Left: 51. Sec.  SENSATION: Light touch: WFL Proprioception: WFL   COGNITION: Overall cognitive status: Within functional limits for tasks assessed   VISION: Subjective report: Glasses. Just received a new prescription to increase bifocal strength Baseline vision: Macular Degeneration Visual history: macular degeneration   VISION ASSESSMENT: To be further assessed in functional context  Left sided Awareness      PERCEPTION: Limited left sided awareness     TODAY'S TREATMENT:     Therapeutic Exercise:   Pt. tolerated PROM in all joint ranges of the left shoulder. Shoulder flexion and abduction at the tabletop surface. Pt. worked on [employer], and reciprocal motion using the UBE while seated for 8 min. with no resistance initially, and the resistance gradually increasing. Pt. performed left shoulder flexion, and  abduction at the tabletop surface.   Neuromuscular re-education:   Pt. worked on [facility] skills grasping 1" sticks. Pt. worked on storing the objects in the palm, and translatory skills moving the items from the palm of the hand to the tip of the 2nd digit, and thumb.    PATIENT EDUCATION: Education details: Compensatory strategies during typing. Person educated: Patient Education method: verbal cues Education comprehension: verbalized understanding  and pt does not plan to cook unless son is present.     HOME EXERCISE PROGRAM:  Reviewed home activities to enhance left hand digit extension  during typing skills.   GOALS: Goals reviewed with patient? Yes     SHORT TERM GOALS: Target date: 02/24/2023     Pt. Will improve FOTO score by 2 points to reflect improved pt. perceived functional performance Baseline: 01/13/2023: 49 12/01/2022: FOTO: 51 10/23/2022: FOTO 49 10/09/2022: FOTO 49, FOTO: 61 TR score: 62  Goal status: INITIAL   LONG TERM GOALS: Target date:04/07/2023  Pt. Will improve left shoulder ROM by 10 degrees to be able able to efficiently apply deodorant Baseline: 01/13/2023:  Shoulder flexion: 35(82), Abduction: 47(70) 12/02/2022: Shoulder flexion: 32(82), Abduction: 45(60)  10/23/2022: Shoulder flexion: 26(80), Abduction: 32(50) 10/09/2022: flexion: 0(55), Abduction: 32(54) Goal status: Improving, but very limited.   2.  Pt. will be able to independently hold and use a blow dryer, and brush for hair care. Baseline: 01/13/2023: Pt. Can hold the hair dryer in the left hand, however is unable to reach up to dry it, and has difficulty using a brush. 12/02/2022: Pt. is unable to actively elevate he left shoulder in order to blow dry her hair. 10/23/2022: Pt. continues to be unable to hold a blow dryer. Eval: Pt. Is unable to sustain her BUEs in elevation, and use a blow dryer, and brush. 10/09/2022: Pt. Is unable to hold a blow dryer. Eval: Pt. Is unable to sustain her BUEs in elevation, and use a blow dryer, and brush. Goal status: Ongoing   3.  Pt. Will improve left lateral pinch strength by 3# to be able to independently cut meat. Baseline: 01/13/2023: Left lateral pinch strength: 9#  12/02/2022: Left pinch: 7# Pt. has difficulty cutting meat. 10/23/2022: Pt. Continues to present with difficulty cutting meat. 10/09/2022: 1#  Eval: 2#. Pt. has difficulty stabilizing utensil, and food while cutting food. Goal status: Ongoing   4.  Pt. Will  improve Left hand [facility] skills to be able to be able to independently, and efficiently manipulate small objects during ADL tasks. Baseline 01/13/2023: 51 sec. 12/02/2022: TBD  10/23/2022: Pt. Continues to present with difficulty manipulating small objects. 10/09/2022: Left: 1 min & 35 sec.: Eval: Left: 52 sec. Right: 27 sec. Goal status: Ongoing     6.: Pt. will improve with left grip strength to be able to hold and pull up covers. Baseline: 01/13/2023: left  grip strength: 15# 12/02/2022: 17# 10/23/2022: Left: 16# 10/09/2022: Left: 10# Eval:  Left grip strength 11#. Pt. has difficulty holding a full drink  securely with the left hand.             Goal status: Ongoing   7. Pt. will improve typing speed, and accuracy in preparation for efficiently typing simple email message. Baseline: 01/13/2023: TBD 12/02/2022:  Pt. continues to present with limited speed, and accuracy with typing.10/23/2022: Pt. continues to present with limited speed, and accuracy with typing. 10/09/2022: Pt. presents with difficulty typing. Pt. presents with difficulty typing an email. Typing speed, and accuracy TBD  Goal status: INITIAL                                        CLINICAL IMPRESSION:  Measurements were obtained, and goals were reviewed with the Pt. Pt. has made progress with left shoulder ROM, left pinch strength, and left hand [facility] skills. Pt. reports having 1/10 pain today. Pt. presents with left shoulder stiffness upon arrival, responds well to treatments, and presented with less stiffness following treatment, however left shoulder ROM is still very limited. Pt. was able to perform translatory movements requiring increased time to  complete. Pt. continues to present with difficulty using the left hand during hair care, applying deodorant, cutting meat, pulling up covers, and typing an email message. Pt. continues to benefit from OT services to work towards improving Left UE functioning in order to maximize engagement  in, and overall independence with ADLs, and IADL tasks.    PERFORMANCE DEFICITS in functional skills including ADLs, IADLs, coordination, dexterity, sensation, ROM, strength, FMC, decreased knowledge of use of DME, vision, and UE functional use, cognitive skills including attention, and psychosocial skills including coping strategies, environmental adaptation, habits, and routines and behaviors.    IMPAIRMENTS are limiting patient from ADLs, IADLs, and social participation.    COMORBIDITIES may have co-morbidities  that affects occupational performance. Patient will benefit from skilled OT to address above impairments and improve overall function.   MODIFICATION OR ASSISTANCE TO COMPLETE EVALUATION: Min-Moderate modification of tasks or assist with assess necessary to complete an evaluation.   OT OCCUPATIONAL PROFILE AND HISTORY: Detailed assessment: Review of records and additional review of physical, cognitive, psychosocial history related to current functional performance.   CLINICAL DECISION MAKING: Moderate - several treatment options, min-mod task modification necessary   REHAB POTENTIAL: Good   EVALUATION COMPLEXITY: Moderate      PLAN: OT FREQUENCY: 2x/week   OT DURATION: 12 weeks   PLANNED INTERVENTIONS: self care/ADL training, therapeutic exercise, therapeutic activity, neuromuscular re-education, manual therapy, passive range of motion, and paraffin   RECOMMENDED OTHER SERVICES: ST, and PT   CONSULTED AND AGREED WITH PLAN OF CARE: Patient   PLAN FOR NEXT SESSION:  L hand strengthening and coordination exercises    Olegario Messier, MS, OTR/L   01/15/23, 10:53 AM

## 2023-01-15 NOTE — Therapy (Signed)
OUTPATIENT PHYSICAL THERAPY  TREATMENT/ PHYSICAL THERAPY PROGRESS NOTE   Dates of reporting period  12/02/2022   to   01/15/2023       Patient Name: Alyssa Mayo MRN: [id number] DOB:11-Oct-1945, 77 y.o., female Today's Date: 12/04/2022   END OF SESSION:    PT End of Session - 01/15/23 0942     Visit Number 31    Number of Visits 36    Date for PT Re-Evaluation 01/22/23    Authorization Type [employer]    Authorization Time Period 10/30/22-01/22/23    Progress Note Due on Visit 30    PT Start Time 0940    Equipment Utilized During Treatment Gait belt    Activity Tolerance Patient tolerated treatment well;No increased pain    Behavior During Therapy [facility] for tasks assessed/performed                     Past Medical History:  Diagnosis Date   Abnormal levels of other serum enzymes 07/17/2015   Arthritis     Constipation 07/11/2015   COVID-19 03/06/2021   Diabetes mellitus without complication (HCC)     Elevated liver enzymes     Fatty liver     Generalized abdominal pain 07/11/2015   Hyperlipidemia     Hypertension     Lifelong obesity     Macular degeneration     Morbid obesity due to excess calories (HCC) 07/11/2015   Other fatigue 07/11/2015   Sleep apnea     Spinal headache           Past Surgical History:  Procedure Laterality Date   ABDOMINAL HYSTERECTOMY       APPENDECTOMY       CATARACT EXTRACTION       CERVICAL LAMINECTOMY   07/21/1985   CESAREAN SECTION       COLONOSCOPY   07/21/2012    diverticulosis, [facility] Dr. [name]    COLONOSCOPY WITH PROPOFOL N/A 02/04/2019    Procedure: COLONOSCOPY WITH PROPOFOL;  Surgeon: [name], MD;  Location: [facility] ENDOSCOPY;  Service: Endoscopy;  Laterality: N/A;   COLONOSCOPY WITH PROPOFOL N/A 03/18/2021    Procedure: COLONOSCOPY WITH PROPOFOL;  Surgeon: [name], MD;  Location: [facility] ENDOSCOPY;  Service: Endoscopy;  Laterality: N/A;  DM   DIAGNOSTIC LAPAROSCOPY        ESOPHAGOGASTRODUODENOSCOPY (EGD) WITH PROPOFOL N/A 02/04/2019    Procedure: ESOPHAGOGASTRODUODENOSCOPY (EGD) WITH PROPOFOL;  Surgeon: [name], MD;  Location: [facility] ENDOSCOPY;  Service: Endoscopy;  Laterality: N/A;   ESOPHAGOGASTRODUODENOSCOPY (EGD) WITH PROPOFOL N/A 03/18/2021    Procedure: ESOPHAGOGASTRODUODENOSCOPY (EGD) WITH PROPOFOL;  Surgeon: [name], MD;  Location: [facility] ENDOSCOPY;  Service: Endoscopy;  Laterality: N/A;   LOOP RECORDER INSERTION N/A 08/05/2021    Procedure: LOOP RECORDER INSERTION;  Surgeon: [name], MD;  Location: [facility] INVASIVE CV LAB;  Service: Cardiovascular;  Laterality: N/A;   SPINE SURGERY       TUBAL LIGATION            Patient Active Problem List    Diagnosis Date Noted   UTI (urinary tract infection) 09/01/2022   COVID-19 virus infection 09/01/2022   Chronic diastolic CHF (congestive heart failure) (HCC) 09/01/2022   Chronic kidney disease, stage 3a (HCC) 09/01/2022   Obesity (BMI 30-39.9) 09/01/2022   Paroxysmal atrial fibrillation (HCC) 08/08/2022   Hypercoagulable state due to paroxysmal atrial fibrillation (HCC) 08/08/2022   Cerebral edema (HCC) 08/07/2021   OSA (obstructive sleep apnea)  08/07/2021   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   CVA (cerebral vascular accident) (HCC) 08/01/2021   GERD (gastroesophageal reflux disease) 07/20/2019   Advanced care planning/counseling discussion 12/17/2016   Skin lesions, generalized 11/13/2016   Chronic right hip pain 06/17/2016   Vitamin D deficiency 09/26/2015   Diastasis recti 08/08/2015   Elevated serum GGT level 07/24/2015   Essential hypertension 07/17/2015   Fatty liver 07/17/2015   Elevated alkaline phosphatase level 07/17/2015   Elevated serum glutamic pyruvic transaminase (SGPT) level 07/17/2015   Abnormal finding on EKG 07/11/2015   Morbid obesity (HCC) 07/11/2015   Colon, diverticulosis 07/11/2015   Abdominal wall hernia 07/11/2015   DM (diabetes  mellitus), type 2 with neurological complications (HCC)     Hyperlipidemia        PCP: [name], MD   REFERRING PROVIDER: [name], MD & Edilia Bo, [place]   REFERRING DIAG: [phone] (ICD-10-CM) - Cerebral infarction due to unspecified occlusion or stenosis of unspecified precerebral arteries, Cervicalgia with degenerative disc   THERAPY DIAG:  Muscle weakness (generalized)   Other lack of coordination   Difficulty in walking, not elsewhere classified   Cervical pain   Pain in left hip   Painful cervical ROM   Apraxia   Unsteadiness on feet   Rationale for Evaluation and Treatment: Rehabilitation   ONSET DATE: 09/03/22   SUBJECTIVE:                                                                                                                                                                                                          SUBJECTIVE STATEMENT:  Pt reports mild stiffness in L UT trap today. Feels like she will be ready to d/c from PT at next appointment.   PERTINENT HISTORY:  Pt is a 77 yo female with RMCA CVA infarction with hemorrhagic transformation 08/01/2021, with inpatient rehab 08/07/2021-08/22/2021 followed by [facility] therapy until June. Pt now currently being seen for outpatient OT and ST services. Pt reports limitations in stamina persist. Other PMH is significant for HTN, HLD, macular degeneration, DM, obesity, sleep apnea, arthritis, chronic R hip pain, vitamin D deficiency, diastasis recti, abdominal wall hernia, hx of abdominal hysterectomy, appendectomy, spine surgery (years ago).    PAIN:  Are you having pain? Yes: NPRS scale: 0/10 Pain location:  L hip/L knee  Pain description: dull ache Aggravating factors: sitting Relieving factors: laying down   PRECAUTIONS: None   WEIGHT BEARING RESTRICTIONS: No   FALLS:  Has patient fallen in last 6 months? Yes. Number of  falls 2   LIVING ENVIRONMENT: Lives with: lives with their son Lives in:  House/apartment Stairs: Yes: Internal: 13 steps; doesn't go up to the second floor and External: 4 steps; can reach both Has following equipment at home: Single point cane and Walker - 2 wheeled   OCCUPATION: Retired   PLOF: Independent with basic ADLs   PATIENT GOALS: "Get stronger. And reduce fall risk. Reduce reliance on cane"    OBJECTIVE:        TODAY'S TREATMENT: DATE: 01/15/23   PT educated pt on HEP in preparation of d/c from PT services. HEP reviewed and expanded to allow increased     See below for details  Exercises - Seated Passive Cervical Retraction  - Marching with Resistance - Side Stepping with Resistance at Ankles and Counter Support  -  - Seated Trunk Rotation with Anchored Resistance   - Heel Toe Raises with Counter Support   - Heel Raises with Counter Support   - Seated Upper Trapezius Stretch - seated cervical rotation with overpressure.   Each performed x 10-12 with cues for full ROM, proper hold at end range and decreased compensation from trunk.    PATIENT EDUCATION:  Education details: Pt educated throughout session about proper posture and technique with exercises. Improved exercise technique, movement at target joints, use of target muscles after min to mod verbal, visual, tactile cues.   Person educated: Patient and   Education method: Explanation Education comprehension: verbalized understanding   HOME EXERCISE PROGRAM:    Access Code: [email or web address] URL: [email or web address] Date: 01/15/2023 Prepared by: Grier Rocher  Exercises - Seated Passive Cervical Retraction  - 1 x daily - 7 x weekly - 3 sets - 10 reps - Marching with Resistance  - 1 x daily - 7 x weekly - 3 sets - 10 reps - Side Stepping with Resistance at Ankles and Counter Support  - 1 x daily - 7 x weekly - 3 sets - 10 reps - Seated Trunk Rotation with Anchored Resistance  - 1 x daily - 7 x weekly - 3 sets - 10 reps - Heel Toe Raises with Counter Support  - 1 x  daily - 7 x weekly - 3 sets - 10 reps - Heel Raises with Counter Support  - 1 x daily - 7 x weekly - 3 sets - 10 reps   Access Code: [email or web address] URL: [email or web address] Date: 01/01/2023 Prepared by: Grier Rocher  Exercises - Sit to Stand with Resistance Around Legs  - 1 x daily - 5 x weekly - 3 sets - 10 reps - Seated Hip Adduction Isometrics with Ball  - 1 x daily - 7 x weekly - 3 sets - 10 reps - Standing March with Counter Support  - 1 x daily - 5 x weekly - 3 sets - 10 reps - Seated Hip Abduction with Resistance  - 1 x daily - 5 x weekly - 3 sets - 10 reps - 3 hold  Access Code: [email or web address] URL: [email or web address] Date: 09/18/2022 Prepared by: Tomasa Hose   Exercises - Seated Upper Trapezius Stretch  - 1 x daily - 7 x weekly - 3 sets - 10 reps - Seated Levator Scapulae Stretch  - 1 x daily - 7 x weekly - 3 sets - 10 reps - Seated Scapular Retraction  - 1 x daily - 7 x weekly - 3 sets - 10 reps     ASSESSMENT:   CLINICAL IMPRESSION: Pt continues to put forth great effort throughout  the session.   Pt reviewed HEP with increased resistance. Pt reports that overall, neck pain and LLE pain have subsided, and is in agreement to d/c from PT services after next therapy session. Pt required instruction from PT for improved safety and technique and decreased compensations to improved activation of target muscle groups and increased TrA activation throughout standing activities.  Pt will continue to benefit from skilled therapy to address remaining deficits in order to improve overall QoL and return to PLOF.         OBJECTIVE IMPAIRMENTS: decreased activity tolerance, decreased cognition, decreased ROM, decreased strength, decreased safety awareness, hypomobility, and pain.    ACTIVITY LIMITATIONS: carrying, lifting, bending, sitting, standing, squatting, and reach over head   PARTICIPATION LIMITATIONS: meal prep, cleaning, laundry, driving, shopping,  community activity, and yard work   PERSONAL FACTORS: Age, Behavior pattern, Past/current experiences, Time since onset of injury/illness/exacerbation, and 3+ comorbidities: arthritis, DM2, HTN, Obesity,   are also affecting patient's functional outcome.    REHAB POTENTIAL: Good   CLINICAL DECISION MAKING: Stable/uncomplicated   EVALUATION COMPLEXITY: Low     GOALS: Goals reviewed with patient? Yes   SHORT TERM GOALS: Target date: 12/11/2022       Pt will be independent with HEP in order to improve strength of BLE and ROM of the neck in order to return to PLOF. Baseline: provided on neck visit Goal status: in progress     LONG TERM GOALS: Target date: 01/22/2023     Patient will demonstrate improved function as evidenced by a score of 68 on FOTO measure for full participation in activities at home and in the community. Baseline: 65. 10/23/22: 52. 5/15 56 for neck and CVA. 6/24: neck: 45 Goal status: IN PROGRESS   2.  Pt to improve NDI by 5 points or 10% in order to demonstrate a reduction in overall disability caused by neck pain at this time. Baseline: 15/50 or 30% disability 4/11 27/50  Goal status: NOT MET   3.  Pt to demonstrate improved cervical rotation to 60 deg bilaterally in order to perform tasks around the house that require turning of the head. Baseline: R/L Cervical Rotation: 49/30. 4/424 R:53. L:42  5/14: R45deg. L50deg 6/25: R 48deg. L 38 deg.  Goal status: IN PROGRESS   4.  Pt to report 0/10 pain in the neck following light household chores at home in order to demonstrate an overall reduction in pain and an improvement in QoL. Baseline: 4/10 at rest 5/14 0/10 in neck. 6/25 3-4/10  Goal status: MET   5.  Pt will improve gait speed to <1.65m/s to indicate improved community mobility and reduced fall risk  Baseline: 0.1m/s  Goal status: achieved     6. Pt will increase 6 min walk test >1033ft  Baseline: 960ft with SPC Goal status: in progress.    PLAN:    PT FREQUENCY: 1-2x/week   PT DURATION: 12 weeks   PLANNED INTERVENTIONS: Therapeutic exercises, Therapeutic activity, Neuromuscular re-education, Balance training, Gait training, Patient/Family education, Self Care, Joint mobilization, Joint manipulation, Stair training, Vestibular training, Canalith repositioning, Visual/preceptual remediation/compensation, DME instructions, Dry Needling, Cognitive remediation, Electrical stimulation, Spinal manipulation, Spinal mobilization, Cryotherapy, Moist heat, Traction, Ultrasound, Manual therapy, and Re-evaluation       PLAN FOR NEXT SESSION:   D/c assessment and review HEP.    Grier Rocher PT, DPT  Physical Therapist - [place]  [facility]  9:43 AM 01/15/23

## 2023-01-16 ENCOUNTER — Other Ambulatory Visit: Payer: Self-pay | Admitting: Orthopedic Surgery

## 2023-01-16 DIAGNOSIS — S46002A Unspecified injury of muscle(s) and tendon(s) of the rotator cuff of left shoulder, initial encounter: Secondary | ICD-10-CM

## 2023-01-19 NOTE — Progress Notes (Signed)
Carelink Summary Report / Loop Recorder 

## 2023-01-20 ENCOUNTER — Ambulatory Visit: Payer: Medicare HMO | Attending: Adult Health | Admitting: Physical Therapy

## 2023-01-20 ENCOUNTER — Ambulatory Visit: Payer: Medicare HMO | Admitting: Occupational Therapy

## 2023-01-20 DIAGNOSIS — R278 Other lack of coordination: Secondary | ICD-10-CM | POA: Diagnosis present

## 2023-01-20 DIAGNOSIS — M542 Cervicalgia: Secondary | ICD-10-CM | POA: Insufficient documentation

## 2023-01-20 DIAGNOSIS — R262 Difficulty in walking, not elsewhere classified: Secondary | ICD-10-CM | POA: Diagnosis present

## 2023-01-20 DIAGNOSIS — M25552 Pain in left hip: Secondary | ICD-10-CM | POA: Insufficient documentation

## 2023-01-20 DIAGNOSIS — M6281 Muscle weakness (generalized): Secondary | ICD-10-CM | POA: Diagnosis present

## 2023-01-20 DIAGNOSIS — R2681 Unsteadiness on feet: Secondary | ICD-10-CM | POA: Diagnosis present

## 2023-01-20 DIAGNOSIS — R482 Apraxia: Secondary | ICD-10-CM | POA: Insufficient documentation

## 2023-01-20 DIAGNOSIS — I63511 Cerebral infarction due to unspecified occlusion or stenosis of right middle cerebral artery: Secondary | ICD-10-CM | POA: Diagnosis present

## 2023-01-20 NOTE — Therapy (Unsigned)
OUTPATIENT PHYSICAL THERAPY  TREATMENT/ PHYSICAL THERAPY PROGRESS NOTE   Dates of reporting period  12/02/2022   to   01/20/2023       Patient Name: Alyssa Mayo MRN: [id number] DOB:03-Jun-1946, 77 y.o., female Today's Date: 12/04/2022   END OF SESSION:    PT End of Session - 01/20/23 1013     Visit Number 32    Number of Visits 36    Date for PT Re-Evaluation 01/22/23    Authorization Type [employer]    Authorization Time Period 10/30/22-01/22/23    Progress Note Due on Visit 30    PT Start Time 1015    Equipment Utilized During Treatment Gait belt    Activity Tolerance Patient tolerated treatment well;No increased pain    Behavior During Therapy [facility] for tasks assessed/performed                     Past Medical History:  Diagnosis Date   Abnormal levels of other serum enzymes 07/17/2015   Arthritis     Constipation 07/11/2015   COVID-19 03/06/2021   Diabetes mellitus without complication (HCC)     Elevated liver enzymes     Fatty liver     Generalized abdominal pain 07/11/2015   Hyperlipidemia     Hypertension     Lifelong obesity     Macular degeneration     Morbid obesity due to excess calories (HCC) 07/11/2015   Other fatigue 07/11/2015   Sleep apnea     Spinal headache           Past Surgical History:  Procedure Laterality Date   ABDOMINAL HYSTERECTOMY       APPENDECTOMY       CATARACT EXTRACTION       CERVICAL LAMINECTOMY   07/21/1985   CESAREAN SECTION       COLONOSCOPY   07/21/2012    diverticulosis, [facility] Dr. [name]    COLONOSCOPY WITH PROPOFOL N/A 02/04/2019    Procedure: COLONOSCOPY WITH PROPOFOL;  Surgeon: [name], MD;  Location: [facility] ENDOSCOPY;  Service: Endoscopy;  Laterality: N/A;   COLONOSCOPY WITH PROPOFOL N/A 03/18/2021    Procedure: COLONOSCOPY WITH PROPOFOL;  Surgeon: [name], MD;  Location: [facility] ENDOSCOPY;  Service: Endoscopy;  Laterality: N/A;  DM   DIAGNOSTIC LAPAROSCOPY        ESOPHAGOGASTRODUODENOSCOPY (EGD) WITH PROPOFOL N/A 02/04/2019    Procedure: ESOPHAGOGASTRODUODENOSCOPY (EGD) WITH PROPOFOL;  Surgeon: [name], MD;  Location: [facility] ENDOSCOPY;  Service: Endoscopy;  Laterality: N/A;   ESOPHAGOGASTRODUODENOSCOPY (EGD) WITH PROPOFOL N/A 03/18/2021    Procedure: ESOPHAGOGASTRODUODENOSCOPY (EGD) WITH PROPOFOL;  Surgeon: [name], MD;  Location: [facility] ENDOSCOPY;  Service: Endoscopy;  Laterality: N/A;   LOOP RECORDER INSERTION N/A 08/05/2021    Procedure: LOOP RECORDER INSERTION;  Surgeon: [name], MD;  Location: [facility] INVASIVE CV LAB;  Service: Cardiovascular;  Laterality: N/A;   SPINE SURGERY       TUBAL LIGATION            Patient Active Problem List    Diagnosis Date Noted   UTI (urinary tract infection) 09/01/2022   COVID-19 virus infection 09/01/2022   Chronic diastolic CHF (congestive heart failure) (HCC) 09/01/2022   Chronic kidney disease, stage 3a (HCC) 09/01/2022   Obesity (BMI 30-39.9) 09/01/2022   Paroxysmal atrial fibrillation (HCC) 08/08/2022   Hypercoagulable state due to paroxysmal atrial fibrillation (HCC) 08/08/2022   Cerebral edema (HCC) 08/07/2021   OSA (obstructive sleep apnea)  08/07/2021   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   CVA (cerebral vascular accident) (HCC) 08/01/2021   GERD (gastroesophageal reflux disease) 07/20/2019   Advanced care planning/counseling discussion 12/17/2016   Skin lesions, generalized 11/13/2016   Chronic right hip pain 06/17/2016   Vitamin D deficiency 09/26/2015   Diastasis recti 08/08/2015   Elevated serum GGT level 07/24/2015   Essential hypertension 07/17/2015   Fatty liver 07/17/2015   Elevated alkaline phosphatase level 07/17/2015   Elevated serum glutamic pyruvic transaminase (SGPT) level 07/17/2015   Abnormal finding on EKG 07/11/2015   Morbid obesity (HCC) 07/11/2015   Colon, diverticulosis 07/11/2015   Abdominal wall hernia 07/11/2015   DM (diabetes  mellitus), type 2 with neurological complications (HCC)     Hyperlipidemia        PCP: [name], MD   REFERRING PROVIDER: [name], MD & Edilia Bo, [place]   REFERRING DIAG: [phone] (ICD-10-CM) - Cerebral infarction due to unspecified occlusion or stenosis of unspecified precerebral arteries, Cervicalgia with degenerative disc   THERAPY DIAG:  Muscle weakness (generalized)   Other lack of coordination   Difficulty in walking, not elsewhere classified   Cervical pain   Pain in left hip   Painful cervical ROM   Apraxia   Unsteadiness on feet   Rationale for Evaluation and Treatment: Rehabilitation   ONSET DATE: 09/03/22   SUBJECTIVE:                                                                                                                                                                                                          SUBJECTIVE STATEMENT:  Pt reports mild stiffness in L UT trap today. Feels like she will be ready to d/c from PT at next appointment.   PERTINENT HISTORY:  Pt is a 77 yo female with RMCA CVA infarction with hemorrhagic transformation 08/01/2021, with inpatient rehab 08/07/2021-08/22/2021 followed by [facility] therapy until June. Pt now currently being seen for outpatient OT and ST services. Pt reports limitations in stamina persist. Other PMH is significant for HTN, HLD, macular degeneration, DM, obesity, sleep apnea, arthritis, chronic R hip pain, vitamin D deficiency, diastasis recti, abdominal wall hernia, hx of abdominal hysterectomy, appendectomy, spine surgery (years ago).    PAIN:  Are you having pain? Yes: NPRS scale: 0/10 Pain location:  L hip/L knee  Pain description: dull ache Aggravating factors: sitting Relieving factors: laying down   PRECAUTIONS: None   WEIGHT BEARING RESTRICTIONS: No   FALLS:  Has patient fallen in last 6 months? Yes. Number of  falls 2   LIVING ENVIRONMENT: Lives with: lives with their son Lives in:  House/apartment Stairs: Yes: Internal: 13 steps; doesn't go up to the second floor and External: 4 steps; can reach both Has following equipment at home: Single point cane and Walker - 2 wheeled   OCCUPATION: Retired   PLOF: Independent with basic ADLs   PATIENT GOALS: "Get stronger. And reduce fall risk. Reduce reliance on cane"    OBJECTIVE:        TODAY'S TREATMENT: DATE: 01/20/23   PT educated pt on HEP in preparation of d/c from PT services. HEP reviewed and expanded to allow increased     See below for details  Exercises - Seated Passive Cervical Retraction  - Marching with Resistance - Side Stepping with Resistance at Ankles and Counter Support  -  - Seated Trunk Rotation with Anchored Resistance   - Heel Toe Raises with Counter Support   - Heel Raises with Counter Support   - Seated Upper Trapezius Stretch - seated cervical rotation with overpressure.   Each performed x 10-12 with cues for full ROM, proper hold at end range and decreased compensation from trunk.    PATIENT EDUCATION:  Education details: Pt educated throughout session about proper posture and technique with exercises. Improved exercise technique, movement at target joints, use of target muscles after min to mod verbal, visual, tactile cues.   Person educated: Patient and   Education method: Explanation Education comprehension: verbalized understanding   HOME EXERCISE PROGRAM:    Access Code: [email or web address] URL: [email or web address] Date: 01/15/2023 Prepared by: Grier Rocher  Exercises - Seated Passive Cervical Retraction  - 1 x daily - 7 x weekly - 3 sets - 10 reps - Marching with Resistance  - 1 x daily - 7 x weekly - 3 sets - 10 reps - Side Stepping with Resistance at Ankles and Counter Support  - 1 x daily - 7 x weekly - 3 sets - 10 reps - Seated Trunk Rotation with Anchored Resistance  - 1 x daily - 7 x weekly - 3 sets - 10 reps - Heel Toe Raises with Counter Support  - 1 x  daily - 7 x weekly - 3 sets - 10 reps - Heel Raises with Counter Support  - 1 x daily - 7 x weekly - 3 sets - 10 reps   Access Code: [email or web address] URL: [email or web address] Date: 01/01/2023 Prepared by: Grier Rocher  Exercises - Sit to Stand with Resistance Around Legs  - 1 x daily - 5 x weekly - 3 sets - 10 reps - Seated Hip Adduction Isometrics with Ball  - 1 x daily - 7 x weekly - 3 sets - 10 reps - Standing March with Counter Support  - 1 x daily - 5 x weekly - 3 sets - 10 reps - Seated Hip Abduction with Resistance  - 1 x daily - 5 x weekly - 3 sets - 10 reps - 3 hold  Access Code: [email or web address] URL: [email or web address] Date: 09/18/2022 Prepared by: Tomasa Hose   Exercises - Seated Upper Trapezius Stretch  - 1 x daily - 7 x weekly - 3 sets - 10 reps - Seated Levator Scapulae Stretch  - 1 x daily - 7 x weekly - 3 sets - 10 reps - Seated Scapular Retraction  - 1 x daily - 7 x weekly - 3 sets - 10 reps     ASSESSMENT:   CLINICAL IMPRESSION: Pt continues to put forth great effort throughout  the session.   Pt reviewed HEP with increased resistance. Pt reports that overall, neck pain and LLE pain have subsided, and is in agreement to d/c from PT services after next therapy session. Pt required instruction from PT for improved safety and technique and decreased compensations to improved activation of target muscle groups and increased TrA activation throughout standing activities.  Pt will continue to benefit from skilled therapy to address remaining deficits in order to improve overall QoL and return to PLOF.         OBJECTIVE IMPAIRMENTS: decreased activity tolerance, decreased cognition, decreased ROM, decreased strength, decreased safety awareness, hypomobility, and pain.    ACTIVITY LIMITATIONS: carrying, lifting, bending, sitting, standing, squatting, and reach over head   PARTICIPATION LIMITATIONS: meal prep, cleaning, laundry, driving, shopping,  community activity, and yard work   PERSONAL FACTORS: Age, Behavior pattern, Past/current experiences, Time since onset of injury/illness/exacerbation, and 3+ comorbidities: arthritis, DM2, HTN, Obesity,   are also affecting patient's functional outcome.    REHAB POTENTIAL: Good   CLINICAL DECISION MAKING: Stable/uncomplicated   EVALUATION COMPLEXITY: Low     GOALS: Goals reviewed with patient? Yes   SHORT TERM GOALS: Target date: 12/11/2022       Pt will be independent with HEP in order to improve strength of BLE and ROM of the neck in order to return to PLOF. Baseline: provided on neck visit Goal status: in progress     LONG TERM GOALS: Target date: 01/22/2023     Patient will demonstrate improved function as evidenced by a score of 68 on FOTO measure for full participation in activities at home and in the community. Baseline: 65. 10/23/22: 52. 5/15 56 for neck and CVA. 6/24: neck: 45 Goal status: IN PROGRESS   2.  Pt to improve NDI by 5 points or 10% in order to demonstrate a reduction in overall disability caused by neck pain at this time. Baseline: 15/50 or 30% disability 4/11 27/50  Goal status: NOT MET   3.  Pt to demonstrate improved cervical rotation to 60 deg bilaterally in order to perform tasks around the house that require turning of the head. Baseline: R/L Cervical Rotation: 49/30. 4/424 R:53. L:42  5/14: R45deg. L50deg 6/25: R 48deg. L 38 deg.  7/2: R: 54deg L 48 deg.  Goal status: not met    4.  Pt to report 0/10 pain in the neck following light household chores at home in order to demonstrate an overall reduction in pain and an improvement in QoL. Baseline: 4/10 at rest 5/14 0/10 in neck. 6/25 3-4/10 7/2: 0/10  Goal status: MET   5.  Pt will improve gait speed to <1.46m/s to indicate improved community mobility and reduced fall risk  Baseline: 0.12m/s 7/2 1.71m/s  Goal status: MET     6. Pt will increase 6 min walk test >1017ft  Baseline: 9100ft with  7/2:  1011ft with no AD.  Goal status:MET   PLAN:   PT FREQUENCY: 1-2x/week   PT DURATION: 12 weeks   PLANNED INTERVENTIONS: Therapeutic exercises, Therapeutic activity, Neuromuscular re-education, Balance training, Gait training, Patient/Family education, Self Care, Joint mobilization, Joint manipulation, Stair training, Vestibular training, Canalith repositioning, Visual/preceptual remediation/compensation, DME instructions, Dry Needling, Cognitive remediation, Electrical stimulation, Spinal manipulation, Spinal mobilization, Cryotherapy, Moist heat, Traction, Ultrasound, Manual therapy, and Re-evaluation       PLAN FOR NEXT SESSION:   D/c assessment and review HEP.    Grier Rocher PT, DPT  Physical Therapist - [place]  [facility]  10:17 AM 01/20/23

## 2023-01-20 NOTE — Therapy (Signed)
Occupational Therapy Neuro Treatment Note     Patient Name: Alyssa Mayo MRN: [id number] DOB:11-22-1945, 77 y.o., female Today's Date: 03/14/2022   PCP: [name], MD REFERRING PROVIDER: [name], NP     OT End of Session - 01/20/23 1107     Visit Number 32    Number of Visits 48    Date for OT Re-Evaluation 04/07/23    Authorization Type Progress reporting period starting 09/16/2022    OT Start Time 1100    OT Stop Time 1145    OT Time Calculation (min) 45 min    Behavior During Therapy [facility] for tasks assessed/performed                                        Past Medical History:  Diagnosis Date   Abnormal levels of other serum enzymes 07/17/2015   Arthritis     Constipation 07/11/2015   COVID-19 03/06/2021   Diabetes mellitus without complication (HCC)     Elevated liver enzymes     Fatty liver     Generalized abdominal pain 07/11/2015   Hyperlipidemia     Hypertension     Lifelong obesity     Macular degeneration     Morbid obesity due to excess calories (HCC) 07/11/2015   Other fatigue 07/11/2015   Sleep apnea     Spinal headache           Past Surgical History:  Procedure Laterality Date   ABDOMINAL HYSTERECTOMY       APPENDECTOMY       CATARACT EXTRACTION       CERVICAL LAMINECTOMY   07/21/1985   CESAREAN SECTION       COLONOSCOPY   07/21/2012    diverticulosis, [facility] Dr. [name]    COLONOSCOPY WITH PROPOFOL N/A 02/04/2019    Procedure: COLONOSCOPY WITH PROPOFOL;  Surgeon: [name], MD;  Location: [facility] ENDOSCOPY;  Service: Endoscopy;  Laterality: N/A;   COLONOSCOPY WITH PROPOFOL N/A 03/18/2021    Procedure: COLONOSCOPY WITH PROPOFOL;  Surgeon: [name], MD;  Location: [facility] ENDOSCOPY;  Service: Endoscopy;  Laterality: N/A;  DM   DIAGNOSTIC LAPAROSCOPY       ESOPHAGOGASTRODUODENOSCOPY (EGD) WITH PROPOFOL N/A 02/04/2019    Procedure: ESOPHAGOGASTRODUODENOSCOPY (EGD) WITH PROPOFOL;  Surgeon: [name],  MD;  Location: [facility] ENDOSCOPY;  Service: Endoscopy;  Laterality: N/A;   ESOPHAGOGASTRODUODENOSCOPY (EGD) WITH PROPOFOL N/A 03/18/2021    Procedure: ESOPHAGOGASTRODUODENOSCOPY (EGD) WITH PROPOFOL;  Surgeon: [name], MD;  Location: [facility] ENDOSCOPY;  Service: Endoscopy;  Laterality: N/A;   LOOP RECORDER INSERTION N/A 08/05/2021    Procedure: LOOP RECORDER INSERTION;  Surgeon: [name], MD;  Location: [facility] INVASIVE CV LAB;  Service: Cardiovascular;  Laterality: N/A;   SPINE SURGERY       TUBAL LIGATION            Patient Active Problem List    Diagnosis Date Noted   Paroxysmal atrial fibrillation (HCC) 08/08/2022   Hypercoagulable state due to paroxysmal atrial fibrillation (HCC) 08/08/2022   Cerebral edema (HCC) 08/07/2021   OSA (obstructive sleep apnea) 08/07/2021   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   CVA (cerebral vascular accident) (HCC) 08/01/2021   GERD (gastroesophageal reflux disease) 07/20/2019   Advanced care planning/counseling discussion 12/17/2016   Skin lesions, generalized 11/13/2016   Chronic right hip pain 06/17/2016   Vitamin D  deficiency 09/26/2015   Diastasis recti 08/08/2015   Elevated serum GGT level 07/24/2015   Essential hypertension 07/17/2015   Fatty liver 07/17/2015   Elevated alkaline phosphatase level 07/17/2015   Elevated serum glutamic pyruvic transaminase (SGPT) level 07/17/2015   Abnormal finding on EKG 07/11/2015   Morbid obesity (HCC) 07/11/2015   Colon, diverticulosis 07/11/2015   Abdominal wall hernia 07/11/2015   DM (diabetes mellitus), type 2 with neurological complications (HCC)     Hyperlipidemia        ONSET DATE: 08/01/2021   REFERRING DIAG: CVA   THERAPY DIAG:  Muscle weakness (generalized)   Rationale for Evaluation and Treatment Rehabilitation   SUBJECTIVE:    SUBJECTIVE STATEMENT:    Pt. reports that she has an MRI for scheduled for Monday, July 8th  Pt accompanied by: self   PERTINENT  HISTORY:  Pt. is a 77 y.o. female who had a unexpectedly hospitalized from 2/12-2/14/2024 for COVID-19, and a UTI.  Pt. 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 Pt. attended inpatient rehab from 08/07/2021-08/22/2021. Pt. received Home health therapy services this past spring.  Pt. has recently had an assessment through driver rehabilitation services at [facility] with recommendations for referrals for  outpatient OT/PT, and ST services. Pt. PMHx includes: HTN, Hyperlipidemia, Macular degeneration, DM, and Obesity.   PRECAUTIONS: None   WEIGHT BEARING RESTRICTIONS No   PAIN:  Are you having pain?  0/10 pain   FALLS: Has patient fallen in last 6 months? Yes.   LIVING ENVIRONMENT: Lives with: lives with their family  Son  [name] Lives in: House/apartment Main living are on one floor Stairs:  2 steps to enter Has following equipment at home: Single point cane, Wheelchair (manual), shower chair, Grab bars, bed rail, and rubber mat.   PLOF: Independent   PATIENT GOALS: To be able to Drive, and cook again   OBJECTIVE:    HAND DOMINANCE: Right    ADLs:  Transfers/ambulation related to ADLs: Independent Eating: Independent Grooming: [employer] Pt. reports being unable to use her left hand to perform haircare.  UB Dressing: Independent pullover shirt, independent now with fastening a bra LB Dressing: Independent donning pants socks, and slide on shoes Toileting: Independent Bathing: Independent Tub Shower transfers: Walk-in shower Independent now     IADLs: Shopping: Needs to be accompanied to the grocery store. Light housekeeping: Do  Meal Prep:  Is able to perform light meal prep Community mobility: Relies on son, and friend Medication management: Son sets up Mining engineer management:TBD Handwriting: TBD   MOBILITY STATUS: Hx of falls out of bed     ACTIVITY TOLERANCE: Activity tolerance:  10-20 min. Before rest break    FUNCTIONAL OUTCOME MEASURES: FOTO: 61  TR score: 62  10/09/2022: FOTO: 49   UPPER EXTREMITY ROM      Active ROM Right eval Left eval Left  10/09/2022 Left 10/23/22 Left 12/02/2022 Left 01/13/2023  Shoulder flexion WFL 54(74) scaption 0(55) 26(80) 32(82) 35(82)  Shoulder abduction WFL 58(75) 32(54) 32(50) 45(60) 47(70)  Shoulder adduction          Shoulder extension          Shoulder internal rotation          Shoulder external rotation          Elbow flexion [facility] WFL 145 145 145 145  Elbow extension [facility] WFL 10(0) 10(0) WFL WFL  Wrist flexion WFL TBD [facility] WFL  Wrist extension WFL TBD [facility] WFL  Wrist ulnar deviation          Wrist radial deviation          Wrist pronation          Wrist supination          (Blank rows = not tested)     UPPER EXTREMITY MMT:      MMT Right eval Left eval Left 10/09/2022 Left 10/23/2022 Left 12/02/2022 Left 01/13/2023  Shoulder flexion 4+/5 2+/5 1/5 2-/5 2-/5 2-/5  Shoulder abduction 4+/5 2+/5 2-/5 2-/5 2-/5 2-/5  Shoulder adduction          Shoulder extension          Shoulder internal rotation          Shoulder external rotation          Middle trapezius          Lower trapezius          Elbow flexion 4+/5 3+/5 3/5 3/5 3+/5 3+/5  Elbow extension 4+/5 3+/5 3/5 3/5 3+/5 3+/5  Wrist flexion 4+/5 TBD 3/5 3/5 3+/5 3+/5  Wrist extension 4+/5 TBD 3/5 3/5 3+/5 3+/5  Wrist ulnar deviation          Wrist radial deviation          Wrist pronation          Wrist supination          (Blank rows = not tested)   HAND FUNCTION: Grip strength: Right: 33 lbs; Left: 11 lbs, Lateral pinch: Right: 17 lbs, Left: 2 lbs, and 3 point pinch: Right: 17 lbs, Left: 2 lbs  10/09/2022: Grip strength: Right: 33 lbs; Left: 10 lbs, Lateral pinch: Right: 17 lbs, Left: 1 lbs, and 3 point pinch: Right: 17 lbs, Left: 1 lbs   10/23/2022: Grip strength: Right: 33 lbs; Left: 16 lbs, Lateral pinch: Right: 17 lbs, Left: 1 lbs, and 3 point pinch: Right: 17 lbs,  Left: 1 lbs  12/02/2022: Grip strength: Right: 33 lbs; Left: 17 lbs, Lateral pinch: Right: 17 lbs, Left: 7 lbs, and 3 point pinch: Right: 17 lbs, Left: 3 lbs   01/13/2023: Grip strength: Right: 33 lbs; Left: 15 lbs, Lateral pinch: Right: 17 lbs, Left: 9 lbs, and 3 point pinch: Right: 17 lbs, Left: 7 lbs     COORDINATION:   9 Hole Peg test: Right: 27 sec.  sec; Left: 52 sec.  10/09/2022: 9 Hole Peg test: Right: 27 sec.  sec; Left: 1 min. & 35 sec.  01/13/2023: 9 Hole Peg test: Right: 27 sec.  sec; Left: 51. Sec.  SENSATION: Light touch: WFL Proprioception: WFL   COGNITION: Overall cognitive status: Within functional limits for tasks assessed   VISION: Subjective report: Glasses. Just received a new prescription to increase bifocal strength Baseline vision: Macular Degeneration Visual history: macular degeneration   VISION ASSESSMENT: To be further assessed in functional context  Left sided Awareness      PERCEPTION: Limited left sided awareness     TODAY'S TREATMENT:     Therapeutic Exercise:   Pt. tolerated PROM in all joint ranges of the left shoulder. Shoulder flexion and abduction at the tabletop surface. Pt. worked on [employer], and reciprocal motion using the UBE while seated for 8 min. with no resistance initially, and the resistance gradually increasing. Pt. performed left shoulder flexion, and abduction at the tabletop surface. .Pt. worked on reaching using the [employer]. Pt. Grasped and moved the shapes through  1 vertical dowel of height.   Neuromuscular re-education:   Pt. worked on tasks to sustain lateral pinch on resistive tweezers while grasping and moving 2" toothpick sticks from a horizontal flat position to a vertical position in order to place it in the holder. Pt. was able to sustain grasp while positioning and extending the wrist/hand in the necessary alignment needed to place the stick through the top of the holder.     PATIENT  EDUCATION: Education details: Compensatory strategies during typing. Person educated: Patient Education method: verbal cues Education comprehension: verbalized understanding and pt does not plan to cook unless son is present.     HOME EXERCISE PROGRAM:  Reviewed home activities to enhance left hand digit extension  during typing skills.   GOALS: Goals reviewed with patient? Yes     SHORT TERM GOALS: Target date: 02/24/2023     Pt. Will improve FOTO score by 2 points to reflect improved pt. perceived functional performance Baseline: 01/13/2023: 49 12/01/2022: FOTO: 51 10/23/2022: FOTO 49 10/09/2022: FOTO 49, FOTO: 61 TR score: 62  Goal status: INITIAL   LONG TERM GOALS: Target date:04/07/2023  Pt. Will improve left shoulder ROM by 10 degrees to be able able to efficiently apply deodorant Baseline: 01/13/2023:  Shoulder flexion: 35(82), Abduction: 47(70) 12/02/2022: Shoulder flexion: 32(82), Abduction: 45(60)  10/23/2022: Shoulder flexion: 26(80), Abduction: 32(50) 10/09/2022: flexion: 0(55), Abduction: 32(54) Goal status: Improving, but very limited.   2.  Pt. will be able to independently hold and use a blow dryer, and brush for hair care. Baseline: 01/13/2023: Pt. Can hold the hair dryer in the left hand, however is unable to reach up to dry it, and has difficulty using a brush. 12/02/2022: Pt. is unable to actively elevate he left shoulder in order to blow dry her hair. 10/23/2022: Pt. continues to be unable to hold a blow dryer. Eval: Pt. Is unable to sustain her BUEs in elevation, and use a blow dryer, and brush. 10/09/2022: Pt. Is unable to hold a blow dryer. Eval: Pt. Is unable to sustain her BUEs in elevation, and use a blow dryer, and brush. Goal status: Ongoing   3.  Pt. Will improve left lateral pinch strength by 3# to be able to independently cut meat. Baseline: 01/13/2023: Left lateral pinch strength: 9#  12/02/2022: Left pinch: 7# Pt. has difficulty cutting meat. 10/23/2022: Pt. Continues  to present with difficulty cutting meat. 10/09/2022: 1#  Eval: 2#. Pt. has difficulty stabilizing utensil, and food while cutting food. Goal status: Ongoing   4.  Pt. Will improve Left hand [facility] skills to be able to be able to independently, and efficiently manipulate small objects during ADL tasks. Baseline 01/13/2023: 51 sec. 12/02/2022: TBD  10/23/2022: Pt. Continues to present with difficulty manipulating small objects. 10/09/2022: Left: 1 min & 35 sec.: Eval: Left: 52 sec. Right: 27 sec. Goal status: Ongoing     6.: Pt. will improve with left grip strength to be able to hold and pull up covers. Baseline: 01/13/2023: left  grip strength: 15# 12/02/2022: 17# 10/23/2022: Left: 16# 10/09/2022: Left: 10# Eval:  Left grip strength 11#. Pt. has difficulty holding a full drink  securely with the left hand.             Goal status: Ongoing   7. Pt. will improve typing speed, and accuracy in preparation for efficiently typing simple email message. Baseline: 01/13/2023: TBD 12/02/2022:  Pt. continues to present with limited speed, and accuracy with typing.10/23/2022: Pt. continues to present  with limited speed, and accuracy with typing. 10/09/2022: Pt. presents with difficulty typing. Pt. presents with difficulty typing an email. Typing speed, and accuracy TBD             Goal status: INITIAL                                        CLINICAL IMPRESSION:  Pt. has an MRI for her shoulder scheduled on Monday next week. Pt. presents with left shoulder stiffness upon arrival, responds well to treatments, and presented with less stiffness following treatment, however left shoulder ROM is still very limited. Pt. was able to perform translatory movements requiring increased time to complete.Pt. requires assist and support proximally when reaching for the shapes with the left UE. Pt. Requires cues for the left hand movement patterns, and hand position with the tweezers. Pt. continues to present with difficulty using the left  hand during hair care, applying deodorant, cutting meat, pulling up covers, and typing an email message. Pt. continues to benefit from OT services to work towards improving Left UE functioning in order to maximize engagement in, and overall independence with ADLs, and IADL tasks.    PERFORMANCE DEFICITS in functional skills including ADLs, IADLs, coordination, dexterity, sensation, ROM, strength, FMC, decreased knowledge of use of DME, vision, and UE functional use, cognitive skills including attention, and psychosocial skills including coping strategies, environmental adaptation, habits, and routines and behaviors.    IMPAIRMENTS are limiting patient from ADLs, IADLs, and social participation.    COMORBIDITIES may have co-morbidities  that affects occupational performance. Patient will benefit from skilled OT to address above impairments and improve overall function.   MODIFICATION OR ASSISTANCE TO COMPLETE EVALUATION: Min-Moderate modification of tasks or assist with assess necessary to complete an evaluation.   OT OCCUPATIONAL PROFILE AND HISTORY: Detailed assessment: Review of records and additional review of physical, cognitive, psychosocial history related to current functional performance.   CLINICAL DECISION MAKING: Moderate - several treatment options, min-mod task modification necessary   REHAB POTENTIAL: Good   EVALUATION COMPLEXITY: Moderate      PLAN: OT FREQUENCY: 2x/week   OT DURATION: 12 weeks   PLANNED INTERVENTIONS: self care/ADL training, therapeutic exercise, therapeutic activity, neuromuscular re-education, manual therapy, passive range of motion, and paraffin   RECOMMENDED OTHER SERVICES: ST, and PT   CONSULTED AND AGREED WITH PLAN OF CARE: Patient   PLAN FOR NEXT SESSION:  L hand strengthening and coordination exercises    Olegario Messier, MS, OTR/L   01/20/23, 11:25 AM

## 2023-01-23 ENCOUNTER — Encounter: Payer: Self-pay | Admitting: Orthopedic Surgery

## 2023-01-26 ENCOUNTER — Other Ambulatory Visit: Payer: Medicare HMO

## 2023-01-27 ENCOUNTER — Ambulatory Visit: Payer: Medicare HMO | Admitting: Physical Therapy

## 2023-01-27 ENCOUNTER — Ambulatory Visit: Payer: Medicare HMO | Admitting: Occupational Therapy

## 2023-01-28 ENCOUNTER — Ambulatory Visit: Payer: Medicare HMO | Admitting: Occupational Therapy

## 2023-01-28 DIAGNOSIS — M6281 Muscle weakness (generalized): Secondary | ICD-10-CM

## 2023-01-28 NOTE — Therapy (Signed)
Occupational Therapy Neuro Treatment Note     Patient Name: Alyssa Mayo MRN: [id number] DOB:10/26/1945, 77 y.o., female Today's Date: 03/14/2022   PCP: [name], MD REFERRING PROVIDER: [name], NP     OT End of Session - 01/28/23 1539     Visit Number 33    Number of Visits 48    Date for OT Re-Evaluation 04/07/23    OT Start Time 1400    OT Stop Time 1445    OT Time Calculation (min) 45 min    Activity Tolerance Patient tolerated treatment well    Behavior During Therapy [facility] for tasks assessed/performed                                        Past Medical History:  Diagnosis Date   Abnormal levels of other serum enzymes 07/17/2015   Arthritis     Constipation 07/11/2015   COVID-19 03/06/2021   Diabetes mellitus without complication (HCC)     Elevated liver enzymes     Fatty liver     Generalized abdominal pain 07/11/2015   Hyperlipidemia     Hypertension     Lifelong obesity     Macular degeneration     Morbid obesity due to excess calories (HCC) 07/11/2015   Other fatigue 07/11/2015   Sleep apnea     Spinal headache           Past Surgical History:  Procedure Laterality Date   ABDOMINAL HYSTERECTOMY       APPENDECTOMY       CATARACT EXTRACTION       CERVICAL LAMINECTOMY   07/21/1985   CESAREAN SECTION       COLONOSCOPY   07/21/2012    diverticulosis, [facility] Dr. [name]    COLONOSCOPY WITH PROPOFOL N/A 02/04/2019    Procedure: COLONOSCOPY WITH PROPOFOL;  Surgeon: [name], MD;  Location: [facility] ENDOSCOPY;  Service: Endoscopy;  Laterality: N/A;   COLONOSCOPY WITH PROPOFOL N/A 03/18/2021    Procedure: COLONOSCOPY WITH PROPOFOL;  Surgeon: [name], MD;  Location: [facility] ENDOSCOPY;  Service: Endoscopy;  Laterality: N/A;  DM   DIAGNOSTIC LAPAROSCOPY       ESOPHAGOGASTRODUODENOSCOPY (EGD) WITH PROPOFOL N/A 02/04/2019    Procedure: ESOPHAGOGASTRODUODENOSCOPY (EGD) WITH PROPOFOL;  Surgeon: [name], MD;   Location: [facility] ENDOSCOPY;  Service: Endoscopy;  Laterality: N/A;   ESOPHAGOGASTRODUODENOSCOPY (EGD) WITH PROPOFOL N/A 03/18/2021    Procedure: ESOPHAGOGASTRODUODENOSCOPY (EGD) WITH PROPOFOL;  Surgeon: [name], MD;  Location: [facility] ENDOSCOPY;  Service: Endoscopy;  Laterality: N/A;   LOOP RECORDER INSERTION N/A 08/05/2021    Procedure: LOOP RECORDER INSERTION;  Surgeon: [name], MD;  Location: [facility] INVASIVE CV LAB;  Service: Cardiovascular;  Laterality: N/A;   SPINE SURGERY       TUBAL LIGATION            Patient Active Problem List    Diagnosis Date Noted   Paroxysmal atrial fibrillation (HCC) 08/08/2022   Hypercoagulable state due to paroxysmal atrial fibrillation (HCC) 08/08/2022   Cerebral edema (HCC) 08/07/2021   OSA (obstructive sleep apnea) 08/07/2021   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   CVA (cerebral vascular accident) (HCC) 08/01/2021   GERD (gastroesophageal reflux disease) 07/20/2019   Advanced care planning/counseling discussion 12/17/2016   Skin lesions, generalized 11/13/2016   Chronic right hip pain 06/17/2016   Vitamin D deficiency  09/26/2015   Diastasis recti 08/08/2015   Elevated serum GGT level 07/24/2015   Essential hypertension 07/17/2015   Fatty liver 07/17/2015   Elevated alkaline phosphatase level 07/17/2015   Elevated serum glutamic pyruvic transaminase (SGPT) level 07/17/2015   Abnormal finding on EKG 07/11/2015   Morbid obesity (HCC) 07/11/2015   Colon, diverticulosis 07/11/2015   Abdominal wall hernia 07/11/2015   DM (diabetes mellitus), type 2 with neurological complications (HCC)     Hyperlipidemia        ONSET DATE: 08/01/2021   REFERRING DIAG: CVA   THERAPY DIAG:  Muscle weakness (generalized)   Rationale for Evaluation and Treatment Rehabilitation   SUBJECTIVE:    SUBJECTIVE STATEMENT:    Pt. reports that she went in for an MRI, but they cancelled it due to insurance not approving it.   Pt accompanied  by: self   PERTINENT HISTORY:  Pt. is a 77 y.o. female who had a unexpectedly hospitalized from 2/12-2/14/2024 for COVID-19, and a UTI.  Pt. 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 Pt. attended inpatient rehab from 08/07/2021-08/22/2021. Pt. received Home health therapy services this past spring.  Pt. has recently had an assessment through driver rehabilitation services at [facility] with recommendations for referrals for  outpatient OT/PT, and ST services. Pt. PMHx includes: HTN, Hyperlipidemia, Macular degeneration, DM, and Obesity.   PRECAUTIONS: None   WEIGHT BEARING RESTRICTIONS No   PAIN:  Are you having pain?  0/10 pain   FALLS: Has patient fallen in last 6 months? Yes.   LIVING ENVIRONMENT: Lives with: lives with their family  Son  [name] Lives in: House/apartment Main living are on one floor Stairs:  2 steps to enter Has following equipment at home: Single point cane, Wheelchair (manual), shower chair, Grab bars, bed rail, and rubber mat.   PLOF: Independent   PATIENT GOALS: To be able to Drive, and cook again   OBJECTIVE:    HAND DOMINANCE: Right    ADLs:  Transfers/ambulation related to ADLs: Independent Eating: Independent Grooming: [employer] Pt. reports being unable to use her left hand to perform haircare.  UB Dressing: Independent pullover shirt, independent now with fastening a bra LB Dressing: Independent donning pants socks, and slide on shoes Toileting: Independent Bathing: Independent Tub Shower transfers: Walk-in shower Independent now     IADLs: Shopping: Needs to be accompanied to the grocery store. Light housekeeping: Do  Meal Prep:  Is able to perform light meal prep Community mobility: Relies on son, and friend Medication management: Son sets up Mining engineer management:TBD Handwriting: TBD   MOBILITY STATUS: Hx of falls out of bed     ACTIVITY TOLERANCE: Activity tolerance:  10-20 min.  Before rest break   FUNCTIONAL OUTCOME MEASURES: FOTO: 61  TR score: 62  10/09/2022: FOTO: 49   UPPER EXTREMITY ROM      Active ROM Right eval Left eval Left  10/09/2022 Left 10/23/22 Left 12/02/2022 Left 01/13/2023  Shoulder flexion WFL 54(74) scaption 0(55) 26(80) 32(82) 35(82)  Shoulder abduction WFL 58(75) 32(54) 32(50) 45(60) 47(70)  Shoulder adduction          Shoulder extension          Shoulder internal rotation          Shoulder external rotation          Elbow flexion [facility] WFL 145 145 145 145  Elbow extension [facility] WFL 10(0) 10(0) WFL WFL  Wrist flexion WFL TBD [facility]  Wrist extension WFL TBD [facility] WFL  Wrist ulnar deviation          Wrist radial deviation          Wrist pronation          Wrist supination          (Blank rows = not tested)     UPPER EXTREMITY MMT:      MMT Right eval Left eval Left 10/09/2022 Left 10/23/2022 Left 12/02/2022 Left 01/13/2023  Shoulder flexion 4+/5 2+/5 1/5 2-/5 2-/5 2-/5  Shoulder abduction 4+/5 2+/5 2-/5 2-/5 2-/5 2-/5  Shoulder adduction          Shoulder extension          Shoulder internal rotation          Shoulder external rotation          Middle trapezius          Lower trapezius          Elbow flexion 4+/5 3+/5 3/5 3/5 3+/5 3+/5  Elbow extension 4+/5 3+/5 3/5 3/5 3+/5 3+/5  Wrist flexion 4+/5 TBD 3/5 3/5 3+/5 3+/5  Wrist extension 4+/5 TBD 3/5 3/5 3+/5 3+/5  Wrist ulnar deviation          Wrist radial deviation          Wrist pronation          Wrist supination          (Blank rows = not tested)   HAND FUNCTION: Grip strength: Right: 33 lbs; Left: 11 lbs, Lateral pinch: Right: 17 lbs, Left: 2 lbs, and 3 point pinch: Right: 17 lbs, Left: 2 lbs  10/09/2022: Grip strength: Right: 33 lbs; Left: 10 lbs, Lateral pinch: Right: 17 lbs, Left: 1 lbs, and 3 point pinch: Right: 17 lbs, Left: 1 lbs   10/23/2022: Grip strength: Right: 33 lbs; Left: 16 lbs, Lateral pinch: Right: 17 lbs, Left: 1 lbs, and 3 point  pinch: Right: 17 lbs, Left: 1 lbs  12/02/2022: Grip strength: Right: 33 lbs; Left: 17 lbs, Lateral pinch: Right: 17 lbs, Left: 7 lbs, and 3 point pinch: Right: 17 lbs, Left: 3 lbs   01/13/2023: Grip strength: Right: 33 lbs; Left: 15 lbs, Lateral pinch: Right: 17 lbs, Left: 9 lbs, and 3 point pinch: Right: 17 lbs, Left: 7 lbs     COORDINATION:   9 Hole Peg test: Right: 27 sec.  sec; Left: 52 sec.  10/09/2022: 9 Hole Peg test: Right: 27 sec.  sec; Left: 1 min. & 35 sec.  01/13/2023: 9 Hole Peg test: Right: 27 sec.  sec; Left: 51. Sec.  SENSATION: Light touch: WFL Proprioception: WFL   COGNITION: Overall cognitive status: Within functional limits for tasks assessed   VISION: Subjective report: Glasses. Just received a new prescription to increase bifocal strength Baseline vision: Macular Degeneration Visual history: macular degeneration   VISION ASSESSMENT: To be further assessed in functional context  Left sided Awareness      PERCEPTION: Limited left sided awareness     TODAY'S TREATMENT:   Therapeutic Exercise:   Pt. tolerated PROM in all joint ranges of the left shoulder. Shoulder flexion and abduction at the tabletop surface. Pt. worked on [employer], and reciprocal motion using the UBE while seated for 8 min. with no resistance initially, and the resistance gradually increasing. Pt. performed left shoulder flexion, and abduction at the tabletop surface.   Neuromuscular re-education:   Pt. worked on [facility] skills grasping  1" sticks. Pt. worked on storing the objects in the palm, and translatory skills moving the items from the palm of the hand to the tip of the 2nd digit, and thumb. Pt. worked on removing the pegs  while grasping,a nd storing them in the palm of her left hand.    Manual Therapy:  Pt. tolerated scapular mobilizations in elevation, depression, abduction/rotation 2/2 tightness. Manual therapy was performed independent of, and in preparation for ROM.      PATIENT EDUCATION: Education details: Compensatory strategies during typing. Person educated: Patient Education method: verbal cues Education comprehension: verbalized understanding and pt does not plan to cook unless son is present.     HOME EXERCISE PROGRAM:  Reviewed home activities to enhance left hand digit extension  during typing skills.   GOALS: Goals reviewed with patient? Yes     SHORT TERM GOALS: Target date: 02/24/2023     Pt. Will improve FOTO score by 2 points to reflect improved pt. perceived functional performance Baseline: 01/13/2023: 49 12/01/2022: FOTO: 51 10/23/2022: FOTO 49 10/09/2022: FOTO 49, FOTO: 61 TR score: 62  Goal status: INITIAL   LONG TERM GOALS: Target date:04/07/2023  Pt. Will improve left shoulder ROM by 10 degrees to be able able to efficiently apply deodorant Baseline: 01/13/2023:  Shoulder flexion: 35(82), Abduction: 47(70) 12/02/2022: Shoulder flexion: 32(82), Abduction: 45(60)  10/23/2022: Shoulder flexion: 26(80), Abduction: 32(50) 10/09/2022: flexion: 0(55), Abduction: 32(54) Goal status: Improving, but very limited.   2.  Pt. will be able to independently hold and use a blow dryer, and brush for hair care. Baseline: 01/13/2023: Pt. Can hold the hair dryer in the left hand, however is unable to reach up to dry it, and has difficulty using a brush. 12/02/2022: Pt. is unable to actively elevate he left shoulder in order to blow dry her hair. 10/23/2022: Pt. continues to be unable to hold a blow dryer. Eval: Pt. Is unable to sustain her BUEs in elevation, and use a blow dryer, and brush. 10/09/2022: Pt. Is unable to hold a blow dryer. Eval: Pt. Is unable to sustain her BUEs in elevation, and use a blow dryer, and brush. Goal status: Ongoing   3.  Pt. Will improve left lateral pinch strength by 3# to be able to independently cut meat. Baseline: 01/13/2023: Left lateral pinch strength: 9#  12/02/2022: Left pinch: 7# Pt. has difficulty cutting meat. 10/23/2022:  Pt. Continues to present with difficulty cutting meat. 10/09/2022: 1#  Eval: 2#. Pt. has difficulty stabilizing utensil, and food while cutting food. Goal status: Ongoing   4.  Pt. Will improve Left hand [facility] skills to be able to be able to independently, and efficiently manipulate small objects during ADL tasks. Baseline 01/13/2023: 51 sec. 12/02/2022: TBD  10/23/2022: Pt. Continues to present with difficulty manipulating small objects. 10/09/2022: Left: 1 min & 35 sec.: Eval: Left: 52 sec. Right: 27 sec. Goal status: Ongoing     6.: Pt. will improve with left grip strength to be able to hold and pull up covers. Baseline: 01/13/2023: left  grip strength: 15# 12/02/2022: 17# 10/23/2022: Left: 16# 10/09/2022: Left: 10# Eval:  Left grip strength 11#. Pt. has difficulty holding a full drink  securely with the left hand.             Goal status: Ongoing   7. Pt. will improve typing speed, and accuracy in preparation for efficiently typing simple email message. Baseline: 01/13/2023: TBD 12/02/2022:  Pt. continues to present with limited speed, and accuracy with  typing.10/23/2022: Pt. continues to present with limited speed, and accuracy with typing. 10/09/2022: Pt. presents with difficulty typing. Pt. presents with difficulty typing an email. Typing speed, and accuracy TBD             Goal status: INITIAL                                        CLINICAL IMPRESSION:  Pt. reports that her insurance did not approve an MRI for her shoulder. Pt. continues to present with left shoulder stiffness upon arrival, responds well to treatment, and presented with less stiffness following treatment, however left shoulder ROM is still very limited. Pt. was able to perform translatory movements  with 1" sticks with less time required to complete. Pt. continues to require cues for the left hand movement patterns, and hand position with the tweezers. Pt. continues to present with difficulty using the left hand during hair care,  applying deodorant, cutting meat, pulling up covers, and typing an email message. Pt. continues to benefit from OT services to work towards improving Left UE functioning in order to maximize engagement in, and overall independence with ADLs, and IADL tasks.    PERFORMANCE DEFICITS in functional skills including ADLs, IADLs, coordination, dexterity, sensation, ROM, strength, FMC, decreased knowledge of use of DME, vision, and UE functional use, cognitive skills including attention, and psychosocial skills including coping strategies, environmental adaptation, habits, and routines and behaviors.    IMPAIRMENTS are limiting patient from ADLs, IADLs, and social participation.    COMORBIDITIES may have co-morbidities  that affects occupational performance. Patient will benefit from skilled OT to address above impairments and improve overall function.   MODIFICATION OR ASSISTANCE TO COMPLETE EVALUATION: Min-Moderate modification of tasks or assist with assess necessary to complete an evaluation.   OT OCCUPATIONAL PROFILE AND HISTORY: Detailed assessment: Review of records and additional review of physical, cognitive, psychosocial history related to current functional performance.   CLINICAL DECISION MAKING: Moderate - several treatment options, min-mod task modification necessary   REHAB POTENTIAL: Good   EVALUATION COMPLEXITY: Moderate      PLAN: OT FREQUENCY: 2x/week   OT DURATION: 12 weeks   PLANNED INTERVENTIONS: self care/ADL training, therapeutic exercise, therapeutic activity, neuromuscular re-education, manual therapy, passive range of motion, and paraffin   RECOMMENDED OTHER SERVICES: ST, and PT   CONSULTED AND AGREED WITH PLAN OF CARE: Patient   PLAN FOR NEXT SESSION:  Left hand strengthening and coordination exercises    Olegario Messier, MS, OTR/L   01/28/23, 3:45 PM

## 2023-02-02 ENCOUNTER — Ambulatory Visit: Payer: Medicare HMO

## 2023-02-02 DIAGNOSIS — I639 Cerebral infarction, unspecified: Secondary | ICD-10-CM | POA: Diagnosis not present

## 2023-02-02 LAB — CUP PACEART REMOTE DEVICE CHECK
Date Time Interrogation Session: 20240712230855
Implantable Pulse Generator Implant Date: 20230116

## 2023-02-03 ENCOUNTER — Ambulatory Visit: Payer: Medicare HMO | Admitting: Physical Therapy

## 2023-02-03 ENCOUNTER — Ambulatory Visit: Payer: Medicare HMO | Admitting: Occupational Therapy

## 2023-02-03 DIAGNOSIS — M6281 Muscle weakness (generalized): Secondary | ICD-10-CM

## 2023-02-03 DIAGNOSIS — R278 Other lack of coordination: Secondary | ICD-10-CM

## 2023-02-03 NOTE — Therapy (Addendum)
Occupational Therapy Neuro Treatment Note     Patient Name: Alyssa Mayo MRN: [id number] DOB:1945/10/31, 77 y.o., female Today's Date: 03/14/2022   PCP: [name], MD REFERRING PROVIDER: [name], NP     OT End of Session - 02/03/23 1106     Visit Number 34    Number of Visits 48    Date for OT Re-Evaluation 04/07/23    OT Start Time 1015    OT Stop Time 1100    OT Time Calculation (min) 45 min    Activity Tolerance Patient tolerated treatment well    Behavior During Therapy [facility] for tasks assessed/performed                                        Past Medical History:  Diagnosis Date   Abnormal levels of other serum enzymes 07/17/2015   Arthritis     Constipation 07/11/2015   COVID-19 03/06/2021   Diabetes mellitus without complication (HCC)     Elevated liver enzymes     Fatty liver     Generalized abdominal pain 07/11/2015   Hyperlipidemia     Hypertension     Lifelong obesity     Macular degeneration     Morbid obesity due to excess calories (HCC) 07/11/2015   Other fatigue 07/11/2015   Sleep apnea     Spinal headache           Past Surgical History:  Procedure Laterality Date   ABDOMINAL HYSTERECTOMY       APPENDECTOMY       CATARACT EXTRACTION       CERVICAL LAMINECTOMY   07/21/1985   CESAREAN SECTION       COLONOSCOPY   07/21/2012    diverticulosis, [facility] Dr. [name]    COLONOSCOPY WITH PROPOFOL N/A 02/04/2019    Procedure: COLONOSCOPY WITH PROPOFOL;  Surgeon: [name], MD;  Location: [facility] ENDOSCOPY;  Service: Endoscopy;  Laterality: N/A;   COLONOSCOPY WITH PROPOFOL N/A 03/18/2021    Procedure: COLONOSCOPY WITH PROPOFOL;  Surgeon: [name], MD;  Location: [facility] ENDOSCOPY;  Service: Endoscopy;  Laterality: N/A;  DM   DIAGNOSTIC LAPAROSCOPY       ESOPHAGOGASTRODUODENOSCOPY (EGD) WITH PROPOFOL N/A 02/04/2019    Procedure: ESOPHAGOGASTRODUODENOSCOPY (EGD) WITH PROPOFOL;  Surgeon: [name], MD;   Location: [facility] ENDOSCOPY;  Service: Endoscopy;  Laterality: N/A;   ESOPHAGOGASTRODUODENOSCOPY (EGD) WITH PROPOFOL N/A 03/18/2021    Procedure: ESOPHAGOGASTRODUODENOSCOPY (EGD) WITH PROPOFOL;  Surgeon: [name], MD;  Location: [facility] ENDOSCOPY;  Service: Endoscopy;  Laterality: N/A;   LOOP RECORDER INSERTION N/A 08/05/2021    Procedure: LOOP RECORDER INSERTION;  Surgeon: [name], MD;  Location: [facility] INVASIVE CV LAB;  Service: Cardiovascular;  Laterality: N/A;   SPINE SURGERY       TUBAL LIGATION            Patient Active Problem List    Diagnosis Date Noted   Paroxysmal atrial fibrillation (HCC) 08/08/2022   Hypercoagulable state due to paroxysmal atrial fibrillation (HCC) 08/08/2022   Cerebral edema (HCC) 08/07/2021   OSA (obstructive sleep apnea) 08/07/2021   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   CVA (cerebral vascular accident) (HCC) 08/01/2021   GERD (gastroesophageal reflux disease) 07/20/2019   Advanced care planning/counseling discussion 12/17/2016   Skin lesions, generalized 11/13/2016   Chronic right hip pain 06/17/2016   Vitamin D deficiency  09/26/2015   Diastasis recti 08/08/2015   Elevated serum GGT level 07/24/2015   Essential hypertension 07/17/2015   Fatty liver 07/17/2015   Elevated alkaline phosphatase level 07/17/2015   Elevated serum glutamic pyruvic transaminase (SGPT) level 07/17/2015   Abnormal finding on EKG 07/11/2015   Morbid obesity (HCC) 07/11/2015   Colon, diverticulosis 07/11/2015   Abdominal wall hernia 07/11/2015   DM (diabetes mellitus), type 2 with neurological complications (HCC)     Hyperlipidemia        ONSET DATE: 08/01/2021   REFERRING DIAG: CVA   THERAPY DIAG:  Muscle weakness (generalized)   Rationale for Evaluation and Treatment Rehabilitation   SUBJECTIVE:    SUBJECTIVE STATEMENT:    Pt. reports that she is doing well today. Pt. reports that her back was hurting on Sunday but it is feeling much  better now.  Pt accompanied by: self   PERTINENT HISTORY:  Pt. is a 77 y.o. female who had a unexpectedly hospitalized from 2/12-2/14/2024 for COVID-19, and a UTI.  Pt. 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 Pt. attended inpatient rehab from 08/07/2021-08/22/2021. Pt. received Home health therapy services this past spring.  Pt. has recently had an assessment through driver rehabilitation services at [facility] with recommendations for referrals for  outpatient OT/PT, and ST services. Pt. PMHx includes: HTN, Hyperlipidemia, Macular degeneration, DM, and Obesity.   PRECAUTIONS: None   WEIGHT BEARING RESTRICTIONS No   PAIN:  Are you having pain?  1/10 pain in LUE   FALLS: Has patient fallen in last 6 months? Yes.   LIVING ENVIRONMENT: Lives with: lives with their family  Son  [name] Lives in: House/apartment Main living are on one floor Stairs:  2 steps to enter Has following equipment at home: Single point cane, Wheelchair (manual), shower chair, Grab bars, bed rail, and rubber mat.   PLOF: Independent   PATIENT GOALS: To be able to Drive, and cook again   OBJECTIVE:    HAND DOMINANCE: Right    ADLs:  Transfers/ambulation related to ADLs: Independent Eating: Independent Grooming: [employer] Pt. reports being unable to use her left hand to perform haircare.  UB Dressing: Independent pullover shirt, independent now with fastening a bra LB Dressing: Independent donning pants socks, and slide on shoes Toileting: Independent Bathing: Independent Tub Shower transfers: Walk-in shower Independent now     IADLs: Shopping: Needs to be accompanied to the grocery store. Light housekeeping: Do  Meal Prep:  Is able to perform light meal prep Community mobility: Relies on son, and friend Medication management: Son sets up Mining engineer management:TBD Handwriting: TBD   MOBILITY STATUS: Hx of falls out of bed     ACTIVITY  TOLERANCE: Activity tolerance:  10-20 min. Before rest break   FUNCTIONAL OUTCOME MEASURES: FOTO: 61  TR score: 62  10/09/2022: FOTO: 49   UPPER EXTREMITY ROM      Active ROM Right eval Left eval Left  10/09/2022 Left 10/23/22 Left 12/02/2022 Left 01/13/2023  Shoulder flexion WFL 54(74) scaption 0(55) 26(80) 32(82) 35(82)  Shoulder abduction WFL 58(75) 32(54) 32(50) 45(60) 47(70)  Shoulder adduction          Shoulder extension          Shoulder internal rotation          Shoulder external rotation          Elbow flexion [facility] WFL 145 145 145 145  Elbow extension [facility] WFL 10(0) 10(0) WFL WFL  Wrist flexion  WFL TBD [facility] WFL  Wrist extension WFL TBD [facility] WFL WFL  Wrist ulnar deviation          Wrist radial deviation          Wrist pronation          Wrist supination          (Blank rows = not tested)     UPPER EXTREMITY MMT:      MMT Right eval Left eval Left 10/09/2022 Left 10/23/2022 Left 12/02/2022 Left 01/13/2023  Shoulder flexion 4+/5 2+/5 1/5 2-/5 2-/5 2-/5  Shoulder abduction 4+/5 2+/5 2-/5 2-/5 2-/5 2-/5  Shoulder adduction          Shoulder extension          Shoulder internal rotation          Shoulder external rotation          Middle trapezius          Lower trapezius          Elbow flexion 4+/5 3+/5 3/5 3/5 3+/5 3+/5  Elbow extension 4+/5 3+/5 3/5 3/5 3+/5 3+/5  Wrist flexion 4+/5 TBD 3/5 3/5 3+/5 3+/5  Wrist extension 4+/5 TBD 3/5 3/5 3+/5 3+/5  Wrist ulnar deviation          Wrist radial deviation          Wrist pronation          Wrist supination          (Blank rows = not tested)   HAND FUNCTION: Grip strength: Right: 33 lbs; Left: 11 lbs, Lateral pinch: Right: 17 lbs, Left: 2 lbs, and 3 point pinch: Right: 17 lbs, Left: 2 lbs  10/09/2022: Grip strength: Right: 33 lbs; Left: 10 lbs, Lateral pinch: Right: 17 lbs, Left: 1 lbs, and 3 point pinch: Right: 17 lbs, Left: 1 lbs   10/23/2022: Grip strength: Right: 33 lbs; Left: 16 lbs, Lateral pinch:  Right: 17 lbs, Left: 1 lbs, and 3 point pinch: Right: 17 lbs, Left: 1 lbs  12/02/2022: Grip strength: Right: 33 lbs; Left: 17 lbs, Lateral pinch: Right: 17 lbs, Left: 7 lbs, and 3 point pinch: Right: 17 lbs, Left: 3 lbs   01/13/2023: Grip strength: Right: 33 lbs; Left: 15 lbs, Lateral pinch: Right: 17 lbs, Left: 9 lbs, and 3 point pinch: Right: 17 lbs, Left: 7 lbs     COORDINATION:   9 Hole Peg test: Right: 27 sec.  sec; Left: 52 sec.  10/09/2022: 9 Hole Peg test: Right: 27 sec.  sec; Left: 1 min. & 35 sec.  01/13/2023: 9 Hole Peg test: Right: 27 sec.  sec; Left: 51. Sec.  SENSATION: Light touch: WFL Proprioception: WFL   COGNITION: Overall cognitive status: Within functional limits for tasks assessed   VISION: Subjective report: Glasses. Just received a new prescription to increase bifocal strength Baseline vision: Macular Degeneration Visual history: macular degeneration   VISION ASSESSMENT: To be further assessed in functional context  Left sided Awareness      PERCEPTION: Limited left sided awareness     TODAY'S TREATMENT:   Therapeutic Exercise:   Pt. tolerated PROM in all joint ranges of the left shoulder. Shoulder flexion and abduction at the tabletop surface. Pt. worked on [employer], and reciprocal motion using the UBE while seated for 8 min. with no resistance initially, and the resistance gradually increasing. Pt. performed left shoulder flexion, and abduction at the tabletop surface.   Neuromuscular re-education:  Pt. worked on [facility] skills by using tip pinch to pick up grooved pegs followed by translatory skills moving the items from the palm of the hand to the tip of the 2nd digit, and thumb before manipulating them into the correct position to fit onto the peg board.  Manual Therapy:  Pt. tolerated scapular mobilizations in elevation, depression, abduction/rotation.  Manual therapy was performed independent of, and in preparation for ROM.     PATIENT  EDUCATION: Education details: Compensatory strategies during typing. Person educated: Patient Education method: verbal cues Education comprehension: verbalized understanding and pt does not plan to cook unless son is present.     HOME EXERCISE PROGRAM:  Reviewed home activities to enhance left hand digit extension  during typing skills.   GOALS: Goals reviewed with patient? Yes     SHORT TERM GOALS: Target date: 02/24/2023     Pt. Will improve FOTO score by 2 points to reflect improved pt. perceived functional performance Baseline: 01/13/2023: 49 12/01/2022: FOTO: 51 10/23/2022: FOTO 49 10/09/2022: FOTO 49, FOTO: 61 TR score: 62  Goal status: INITIAL   LONG TERM GOALS: Target date:04/07/2023  Pt. Will improve left shoulder ROM by 10 degrees to be able able to efficiently apply deodorant Baseline: 01/13/2023:  Shoulder flexion: 35(82), Abduction: 47(70) 12/02/2022: Shoulder flexion: 32(82), Abduction: 45(60)  10/23/2022: Shoulder flexion: 26(80), Abduction: 32(50) 10/09/2022: flexion: 0(55), Abduction: 32(54) Goal status: Improving, but very limited.   2.  Pt. will be able to independently hold and use a blow dryer, and brush for hair care. Baseline: 01/13/2023: Pt. Can hold the hair dryer in the left hand, however is unable to reach up to dry it, and has difficulty using a brush. 12/02/2022: Pt. is unable to actively elevate he left shoulder in order to blow dry her hair. 10/23/2022: Pt. continues to be unable to hold a blow dryer. Eval: Pt. Is unable to sustain her BUEs in elevation, and use a blow dryer, and brush. 10/09/2022: Pt. Is unable to hold a blow dryer. Eval: Pt. Is unable to sustain her BUEs in elevation, and use a blow dryer, and brush. Goal status: Ongoing   3.  Pt. Will improve left lateral pinch strength by 3# to be able to independently cut meat. Baseline: 01/13/2023: Left lateral pinch strength: 9#  12/02/2022: Left pinch: 7# Pt. has difficulty cutting meat. 10/23/2022: Pt. Continues  to present with difficulty cutting meat. 10/09/2022: 1#  Eval: 2#. Pt. has difficulty stabilizing utensil, and food while cutting food. Goal status: Ongoing   4.  Pt. Will improve Left hand [facility] skills to be able to be able to independently, and efficiently manipulate small objects during ADL tasks. Baseline 01/13/2023: 51 sec. 12/02/2022: TBD  10/23/2022: Pt. Continues to present with difficulty manipulating small objects. 10/09/2022: Left: 1 min & 35 sec.: Eval: Left: 52 sec. Right: 27 sec. Goal status: Ongoing     6.: Pt. will improve with left grip strength to be able to hold and pull up covers. Baseline: 01/13/2023: left  grip strength: 15# 12/02/2022: 17# 10/23/2022: Left: 16# 10/09/2022: Left: 10# Eval:  Left grip strength 11#. Pt. has difficulty holding a full drink  securely with the left hand.             Goal status: Ongoing   7. Pt. will improve typing speed, and accuracy in preparation for efficiently typing simple email message. Baseline: 01/13/2023: TBD 12/02/2022:  Pt. continues to present with limited speed, and accuracy with typing.10/23/2022: Pt. continues to present  with limited speed, and accuracy with typing. 10/09/2022: Pt. presents with difficulty typing. Pt. presents with difficulty typing an email. Typing speed, and accuracy TBD             Goal status: INITIAL                                        CLINICAL IMPRESSION:  Pt. reports that her LUE is feeling very tight today. Pt. continues to present with left shoulder stiffness upon arrival, responds well to treatment, and presented with less stiffness following treatment, however left shoulder ROM is still very limited. Pt. was able to perform translatory movements with grooved pegs however required increased time. Pt. Was able to place 5 grooved pegs onto the peg board however used her R hand 2 times to help readjust her pinch on the grooved pegs to prevent herself from dropping it. Pt. continues to present with difficulty using the  left hand during hair care, applying deodorant, cutting meat, pulling up covers, and typing an email message. Pt. continues to benefit from OT services to work towards improving Left UE functioning in order to maximize engagement in, and overall independence with ADLs, and IADL tasks.    PERFORMANCE DEFICITS in functional skills including ADLs, IADLs, coordination, dexterity, sensation, ROM, strength, FMC, decreased knowledge of use of DME, vision, and UE functional use, cognitive skills including attention, and psychosocial skills including coping strategies, environmental adaptation, habits, and routines and behaviors.    IMPAIRMENTS are limiting patient from ADLs, IADLs, and social participation.    COMORBIDITIES may have co-morbidities  that affects occupational performance. Patient will benefit from skilled OT to address above impairments and improve overall function.   MODIFICATION OR ASSISTANCE TO COMPLETE EVALUATION: Min-Moderate modification of tasks or assist with assess necessary to complete an evaluation.   OT OCCUPATIONAL PROFILE AND HISTORY: Detailed assessment: Review of records and additional review of physical, cognitive, psychosocial history related to current functional performance.   CLINICAL DECISION MAKING: Moderate - several treatment options, min-mod task modification necessary   REHAB POTENTIAL: Good   EVALUATION COMPLEXITY: Moderate      PLAN: OT FREQUENCY: 2x/week   OT DURATION: 12 weeks   PLANNED INTERVENTIONS: self care/ADL training, therapeutic exercise, therapeutic activity, neuromuscular re-education, manual therapy, passive range of motion, and paraffin   RECOMMENDED OTHER SERVICES: ST, and PT   CONSULTED AND AGREED WITH PLAN OF CARE: Patient   PLAN FOR NEXT SESSION:  Left hand strengthening and coordination exercises    Herma Carson, OTS 02/03/2023 11:08 AM  This entire session was performed under the direct supervision and direction of a  licensed therapist. I have personally read, edited, and approve of the note as written.  Olegario Messier, MS, OTR/L  02/03/2023

## 2023-02-05 ENCOUNTER — Ambulatory Visit: Payer: Medicare HMO | Admitting: Physical Therapy

## 2023-02-05 ENCOUNTER — Ambulatory Visit: Payer: Medicare HMO | Admitting: Occupational Therapy

## 2023-02-05 DIAGNOSIS — M6281 Muscle weakness (generalized): Secondary | ICD-10-CM

## 2023-02-05 DIAGNOSIS — R278 Other lack of coordination: Secondary | ICD-10-CM

## 2023-02-05 NOTE — Therapy (Addendum)
Occupational Therapy Neuro Treatment Note     Patient Name: Alyssa Mayo MRN: [id number] DOB:1946/03/30, 77 y.o., female Today's Date: 03/14/2022   PCP: [name], MD REFERRING PROVIDER: [name], NP     OT End of Session - 02/05/23 1022     Visit Number 35    Date for OT Re-Evaluation 04/07/23    Authorization Type     OT Start Time 1015    OT Stop Time 1100    OT Time Calculation (min) 45 min    Activity Tolerance Patient tolerated treatment well    Behavior During Therapy [facility] for tasks assessed/performed                                        Past Medical History:  Diagnosis Date   Abnormal levels of other serum enzymes 07/17/2015   Arthritis     Constipation 07/11/2015   COVID-19 03/06/2021   Diabetes mellitus without complication (HCC)     Elevated liver enzymes     Fatty liver     Generalized abdominal pain 07/11/2015   Hyperlipidemia     Hypertension     Lifelong obesity     Macular degeneration     Morbid obesity due to excess calories (HCC) 07/11/2015   Other fatigue 07/11/2015   Sleep apnea     Spinal headache           Past Surgical History:  Procedure Laterality Date   ABDOMINAL HYSTERECTOMY       APPENDECTOMY       CATARACT EXTRACTION       CERVICAL LAMINECTOMY   07/21/1985   CESAREAN SECTION       COLONOSCOPY   07/21/2012    diverticulosis, [facility] Dr. [name]    COLONOSCOPY WITH PROPOFOL N/A 02/04/2019    Procedure: COLONOSCOPY WITH PROPOFOL;  Surgeon: [name], MD;  Location: [facility] ENDOSCOPY;  Service: Endoscopy;  Laterality: N/A;   COLONOSCOPY WITH PROPOFOL N/A 03/18/2021    Procedure: COLONOSCOPY WITH PROPOFOL;  Surgeon: [name], MD;  Location: [facility] ENDOSCOPY;  Service: Endoscopy;  Laterality: N/A;  DM   DIAGNOSTIC LAPAROSCOPY       ESOPHAGOGASTRODUODENOSCOPY (EGD) WITH PROPOFOL N/A 02/04/2019    Procedure: ESOPHAGOGASTRODUODENOSCOPY (EGD) WITH PROPOFOL;  Surgeon: [name], MD;   Location: [facility] ENDOSCOPY;  Service: Endoscopy;  Laterality: N/A;   ESOPHAGOGASTRODUODENOSCOPY (EGD) WITH PROPOFOL N/A 03/18/2021    Procedure: ESOPHAGOGASTRODUODENOSCOPY (EGD) WITH PROPOFOL;  Surgeon: [name], MD;  Location: [facility] ENDOSCOPY;  Service: Endoscopy;  Laterality: N/A;   LOOP RECORDER INSERTION N/A 08/05/2021    Procedure: LOOP RECORDER INSERTION;  Surgeon: [name], MD;  Location: [facility] INVASIVE CV LAB;  Service: Cardiovascular;  Laterality: N/A;   SPINE SURGERY       TUBAL LIGATION            Patient Active Problem List    Diagnosis Date Noted   Paroxysmal atrial fibrillation (HCC) 08/08/2022   Hypercoagulable state due to paroxysmal atrial fibrillation (HCC) 08/08/2022   Cerebral edema (HCC) 08/07/2021   OSA (obstructive sleep apnea) 08/07/2021   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   CVA (cerebral vascular accident) (HCC) 08/01/2021   GERD (gastroesophageal reflux disease) 07/20/2019   Advanced care planning/counseling discussion 12/17/2016   Skin lesions, generalized 11/13/2016   Chronic right hip pain 06/17/2016   Vitamin D deficiency 09/26/2015  Diastasis recti 08/08/2015   Elevated serum GGT level 07/24/2015   Essential hypertension 07/17/2015   Fatty liver 07/17/2015   Elevated alkaline phosphatase level 07/17/2015   Elevated serum glutamic pyruvic transaminase (SGPT) level 07/17/2015   Abnormal finding on EKG 07/11/2015   Morbid obesity (HCC) 07/11/2015   Colon, diverticulosis 07/11/2015   Abdominal wall hernia 07/11/2015   DM (diabetes mellitus), type 2 with neurological complications (HCC)     Hyperlipidemia        ONSET DATE: 08/01/2021   REFERRING DIAG: CVA   THERAPY DIAG:  Muscle weakness (generalized)   Rationale for Evaluation and Treatment Rehabilitation   SUBJECTIVE:    SUBJECTIVE STATEMENT:    Pt. reports that Monia Pouch has still approved an MRI for her shoulder injury.    Pt accompanied by: self   PERTINENT  HISTORY:  Pt. is a 77 y.o. female who had a unexpectedly hospitalized from 2/12-2/14/2024 for COVID-19, and a UTI.  Pt. 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 Pt. attended inpatient rehab from 08/07/2021-08/22/2021. Pt. received Home health therapy services this past spring.  Pt. has recently had an assessment through driver rehabilitation services at [facility] with recommendations for referrals for  outpatient OT/PT, and ST services. Pt. PMHx includes: HTN, Hyperlipidemia, Macular degeneration, DM, and Obesity.   PRECAUTIONS: None   WEIGHT BEARING RESTRICTIONS No   PAIN:  Are you having pain?  0/10 pain   FALLS: Has patient fallen in last 6 months? Yes.   LIVING ENVIRONMENT: Lives with: lives with their family  Son  [name] Lives in: House/apartment Main living are on one floor Stairs:  2 steps to enter Has following equipment at home: Single point cane, Wheelchair (manual), shower chair, Grab bars, bed rail, and rubber mat.   PLOF: Independent   PATIENT GOALS: To be able to Drive, and cook again   OBJECTIVE:    HAND DOMINANCE: Right    ADLs:  Transfers/ambulation related to ADLs: Independent Eating: Independent Grooming: [employer] Pt. reports being unable to use her left hand to perform haircare.  UB Dressing: Independent pullover shirt, independent now with fastening a bra LB Dressing: Independent donning pants socks, and slide on shoes Toileting: Independent Bathing: Independent Tub Shower transfers: Walk-in shower Independent now     IADLs: Shopping: Needs to be accompanied to the grocery store. Light housekeeping: Do  Meal Prep:  Is able to perform light meal prep Community mobility: Relies on son, and friend Medication management: Son sets up Mining engineer management:TBD Handwriting: TBD   MOBILITY STATUS: Hx of falls out of bed     ACTIVITY TOLERANCE: Activity tolerance:  10-20 min. Before rest break    FUNCTIONAL OUTCOME MEASURES: FOTO: 61  TR score: 62  10/09/2022: FOTO: 49   UPPER EXTREMITY ROM      Active ROM Right eval Left eval Left  10/09/2022 Left 10/23/22 Left 12/02/2022 Left 01/13/2023  Shoulder flexion WFL 54(74) scaption 0(55) 26(80) 32(82) 35(82)  Shoulder abduction WFL 58(75) 32(54) 32(50) 45(60) 47(70)  Shoulder adduction          Shoulder extension          Shoulder internal rotation          Shoulder external rotation          Elbow flexion [facility] WFL 145 145 145 145  Elbow extension [facility] WFL 10(0) 10(0) WFL WFL  Wrist flexion WFL TBD [facility] WFL WFL  Wrist extension WFL TBD [facility]  WFL  Wrist ulnar deviation          Wrist radial deviation          Wrist pronation          Wrist supination          (Blank rows = not tested)     UPPER EXTREMITY MMT:      MMT Right eval Left eval Left 10/09/2022 Left 10/23/2022 Left 12/02/2022 Left 01/13/2023  Shoulder flexion 4+/5 2+/5 1/5 2-/5 2-/5 2-/5  Shoulder abduction 4+/5 2+/5 2-/5 2-/5 2-/5 2-/5  Shoulder adduction          Shoulder extension          Shoulder internal rotation          Shoulder external rotation          Middle trapezius          Lower trapezius          Elbow flexion 4+/5 3+/5 3/5 3/5 3+/5 3+/5  Elbow extension 4+/5 3+/5 3/5 3/5 3+/5 3+/5  Wrist flexion 4+/5 TBD 3/5 3/5 3+/5 3+/5  Wrist extension 4+/5 TBD 3/5 3/5 3+/5 3+/5  Wrist ulnar deviation          Wrist radial deviation          Wrist pronation          Wrist supination          (Blank rows = not tested)   HAND FUNCTION: Grip strength: Right: 33 lbs; Left: 11 lbs, Lateral pinch: Right: 17 lbs, Left: 2 lbs, and 3 point pinch: Right: 17 lbs, Left: 2 lbs  10/09/2022: Grip strength: Right: 33 lbs; Left: 10 lbs, Lateral pinch: Right: 17 lbs, Left: 1 lbs, and 3 point pinch: Right: 17 lbs, Left: 1 lbs   10/23/2022: Grip strength: Right: 33 lbs; Left: 16 lbs, Lateral pinch: Right: 17 lbs, Left: 1 lbs, and 3 point pinch: Right: 17 lbs,  Left: 1 lbs  12/02/2022: Grip strength: Right: 33 lbs; Left: 17 lbs, Lateral pinch: Right: 17 lbs, Left: 7 lbs, and 3 point pinch: Right: 17 lbs, Left: 3 lbs   01/13/2023: Grip strength: Right: 33 lbs; Left: 15 lbs, Lateral pinch: Right: 17 lbs, Left: 9 lbs, and 3 point pinch: Right: 17 lbs, Left: 7 lbs     COORDINATION:   9 Hole Peg test: Right: 27 sec.  sec; Left: 52 sec.  10/09/2022: 9 Hole Peg test: Right: 27 sec.  sec; Left: 1 min. & 35 sec.  01/13/2023: 9 Hole Peg test: Right: 27 sec.  sec; Left: 51. Sec.  SENSATION: Light touch: WFL Proprioception: WFL   COGNITION: Overall cognitive status: Within functional limits for tasks assessed   VISION: Subjective report: Glasses. Just received a new prescription to increase bifocal strength Baseline vision: Macular Degeneration Visual history: macular degeneration   VISION ASSESSMENT: To be further assessed in functional context  Left sided Awareness      PERCEPTION: Limited left sided awareness     TODAY'S TREATMENT:   Therapeutic Exercise:   Pt. tolerated PROM in all joint ranges of the left shoulder. Shoulder flexion and abduction at the tabletop surface. Pt. worked on [employer], and reciprocal motion using the UBE while seated for 8 min. with no resistance initially, and the resistance gradually increasing. Pt. performed left shoulder flexion, and abduction at the tabletop surface.   Neuromuscular re-education:   Pt. worked on tasks to sustain left  lateral pinch on resistive tweezers  while grasping and moving 2" toothpick sticks from a horizontal flat position to a vertical position in order to place it in the holder. Pt. was able to sustain grasp while positioning and extending the wrist/hand in the necessary alignment needed to place the stick through the top of the holder.     Manual Therapy:  Pt. tolerated scapular mobilizations in elevation, depression, abduction/rotation 2/2 tightness. Manual therapy was  performed independent of, and in preparation for ROM.     PATIENT EDUCATION: Education details: Compensatory strategies during typing. Person educated: Patient Education method: verbal cues Education comprehension: verbalized understanding and pt does not plan to cook unless son is present.     HOME EXERCISE PROGRAM:  Reviewed home activities to enhance left hand digit extension  during typing skills.   GOALS: Goals reviewed with patient? Yes     SHORT TERM GOALS: Target date: 02/24/2023     Pt. Will improve FOTO score by 2 points to reflect improved pt. perceived functional performance Baseline: 01/13/2023: 49 12/01/2022: FOTO: 51 10/23/2022: FOTO 49 10/09/2022: FOTO 49, FOTO: 61 TR score: 62  Goal status: INITIAL   LONG TERM GOALS: Target date:04/07/2023  Pt. Will improve left shoulder ROM by 10 degrees to be able able to efficiently apply deodorant Baseline: 01/13/2023:  Shoulder flexion: 35(82), Abduction: 47(70) 12/02/2022: Shoulder flexion: 32(82), Abduction: 45(60)  10/23/2022: Shoulder flexion: 26(80), Abduction: 32(50) 10/09/2022: flexion: 0(55), Abduction: 32(54) Goal status: Improving, but very limited.   2.  Pt. will be able to independently hold and use a blow dryer, and brush for hair care. Baseline: 01/13/2023: Pt. Can hold the hair dryer in the left hand, however is unable to reach up to dry it, and has difficulty using a brush. 12/02/2022: Pt. is unable to actively elevate he left shoulder in order to blow dry her hair. 10/23/2022: Pt. continues to be unable to hold a blow dryer. Eval: Pt. Is unable to sustain her BUEs in elevation, and use a blow dryer, and brush. 10/09/2022: Pt. Is unable to hold a blow dryer. Eval: Pt. Is unable to sustain her BUEs in elevation, and use a blow dryer, and brush. Goal status: Ongoing   3.  Pt. Will improve left lateral pinch strength by 3# to be able to independently cut meat. Baseline: 01/13/2023: Left lateral pinch strength: 9#  12/02/2022: Left  pinch: 7# Pt. has difficulty cutting meat. 10/23/2022: Pt. Continues to present with difficulty cutting meat. 10/09/2022: 1#  Eval: 2#. Pt. has difficulty stabilizing utensil, and food while cutting food. Goal status: Ongoing   4.  Pt. Will improve Left hand [facility] skills to be able to be able to independently, and efficiently manipulate small objects during ADL tasks. Baseline 01/13/2023: 51 sec. 12/02/2022: TBD  10/23/2022: Pt. Continues to present with difficulty manipulating small objects. 10/09/2022: Left: 1 min & 35 sec.: Eval: Left: 52 sec. Right: 27 sec. Goal status: Ongoing     6.: Pt. will improve with left grip strength to be able to hold and pull up covers. Baseline: 01/13/2023: left  grip strength: 15# 12/02/2022: 17# 10/23/2022: Left: 16# 10/09/2022: Left: 10# Eval:  Left grip strength 11#. Pt. has difficulty holding a full drink  securely with the left hand.             Goal status: Ongoing   7. Pt. will improve typing speed, and accuracy in preparation for efficiently typing simple email message. Baseline: 01/13/2023: TBD 12/02/2022:  Pt. continues to present with limited speed, and accuracy  with typing.10/23/2022: Pt. continues to present with limited speed, and accuracy with typing. 10/09/2022: Pt. presents with difficulty typing. Pt. presents with difficulty typing an email. Typing speed, and accuracy TBD             Goal status: INITIAL                                        CLINICAL IMPRESSION:  Pt. reports that her insurance still has not approved an MRI for her shoulder. Pt. continues to present with left shoulder stiffness upon arrival, responds well to treatment, and presented with less stiffness following treatment. Left shoulder ROM is still very limited.  Pt. continues to require cues for the left hand movement patterns, and hand position with the tweezers. Pt. continues to present with difficulty using the left hand during hair care, applying deodorant, cutting meat, pulling up  covers, and typing an email message. Pt. continues to benefit from OT services to work towards improving Left UE functioning in order to maximize engagement in, and overall independence with ADLs, and IADL tasks.    PERFORMANCE DEFICITS in functional skills including ADLs, IADLs, coordination, dexterity, sensation, ROM, strength, FMC, decreased knowledge of use of DME, vision, and UE functional use, cognitive skills including attention, and psychosocial skills including coping strategies, environmental adaptation, habits, and routines and behaviors.    IMPAIRMENTS are limiting patient from ADLs, IADLs, and social participation.    COMORBIDITIES may have co-morbidities  that affects occupational performance. Patient will benefit from skilled OT to address above impairments and improve overall function.   MODIFICATION OR ASSISTANCE TO COMPLETE EVALUATION: Min-Moderate modification of tasks or assist with assess necessary to complete an evaluation.   OT OCCUPATIONAL PROFILE AND HISTORY: Detailed assessment: Review of records and additional review of physical, cognitive, psychosocial history related to current functional performance.   CLINICAL DECISION MAKING: Moderate - several treatment options, min-mod task modification necessary   REHAB POTENTIAL: Good   EVALUATION COMPLEXITY: Moderate      PLAN: OT FREQUENCY: 2x/week   OT DURATION: 12 weeks   PLANNED INTERVENTIONS: self care/ADL training, therapeutic exercise, therapeutic activity, neuromuscular re-education, manual therapy, passive range of motion, and paraffin   RECOMMENDED OTHER SERVICES: ST, and PT   CONSULTED AND AGREED WITH PLAN OF CARE: Patient   PLAN FOR NEXT SESSION:  Left hand strengthening and coordination exercises    Olegario Messier, MS, OTR/L   02/05/23, 10:25 AM

## 2023-02-10 ENCOUNTER — Other Ambulatory Visit: Payer: Self-pay

## 2023-02-10 ENCOUNTER — Ambulatory Visit: Payer: Medicare HMO | Admitting: Occupational Therapy

## 2023-02-10 ENCOUNTER — Ambulatory Visit: Payer: Medicare HMO | Admitting: Physical Therapy

## 2023-02-10 DIAGNOSIS — R278 Other lack of coordination: Secondary | ICD-10-CM

## 2023-02-10 DIAGNOSIS — M6281 Muscle weakness (generalized): Secondary | ICD-10-CM

## 2023-02-10 MED ORDER — GABAPENTIN 100 MG PO CAPS: 100.0000 mg | ORAL_CAPSULE | Freq: Every evening | ORAL | 0 refills | Status: DC

## 2023-02-10 NOTE — Therapy (Addendum)
Occupational Therapy Neuro Treatment Note     Patient Name: Tran Randle MRN: [id number] DOB:03/15/1946, 77 y.o., female Today's Date: 03/14/2022   PCP: [name], MD REFERRING PROVIDER: [name], NP     OT End of Session - 02/10/23 1108     Visit Number 36    Number of Visits 48    Date for OT Re-Evaluation 04/07/23    OT Start Time 1015    OT Stop Time 1100    OT Time Calculation (min) 45 min    Activity Tolerance Patient tolerated treatment well    Behavior During Therapy [facility] for tasks assessed/performed                                           Past Medical History:  Diagnosis Date   Abnormal levels of other serum enzymes 07/17/2015   Arthritis     Constipation 07/11/2015   COVID-19 03/06/2021   Diabetes mellitus without complication (HCC)     Elevated liver enzymes     Fatty liver     Generalized abdominal pain 07/11/2015   Hyperlipidemia     Hypertension     Lifelong obesity     Macular degeneration     Morbid obesity due to excess calories (HCC) 07/11/2015   Other fatigue 07/11/2015   Sleep apnea     Spinal headache           Past Surgical History:  Procedure Laterality Date   ABDOMINAL HYSTERECTOMY       APPENDECTOMY       CATARACT EXTRACTION       CERVICAL LAMINECTOMY   07/21/1985   CESAREAN SECTION       COLONOSCOPY   07/21/2012    diverticulosis, [facility] Dr. [name]    COLONOSCOPY WITH PROPOFOL N/A 02/04/2019    Procedure: COLONOSCOPY WITH PROPOFOL;  Surgeon: [name], MD;  Location: [facility] ENDOSCOPY;  Service: Endoscopy;  Laterality: N/A;   COLONOSCOPY WITH PROPOFOL N/A 03/18/2021    Procedure: COLONOSCOPY WITH PROPOFOL;  Surgeon: [name], MD;  Location: [facility] ENDOSCOPY;  Service: Endoscopy;  Laterality: N/A;  DM   DIAGNOSTIC LAPAROSCOPY       ESOPHAGOGASTRODUODENOSCOPY (EGD) WITH PROPOFOL N/A 02/04/2019    Procedure: ESOPHAGOGASTRODUODENOSCOPY (EGD) WITH PROPOFOL;  Surgeon: [name], MD;   Location: [facility] ENDOSCOPY;  Service: Endoscopy;  Laterality: N/A;   ESOPHAGOGASTRODUODENOSCOPY (EGD) WITH PROPOFOL N/A 03/18/2021    Procedure: ESOPHAGOGASTRODUODENOSCOPY (EGD) WITH PROPOFOL;  Surgeon: [name], MD;  Location: [facility] ENDOSCOPY;  Service: Endoscopy;  Laterality: N/A;   LOOP RECORDER INSERTION N/A 08/05/2021    Procedure: LOOP RECORDER INSERTION;  Surgeon: [name], MD;  Location: [facility] INVASIVE CV LAB;  Service: Cardiovascular;  Laterality: N/A;   SPINE SURGERY       TUBAL LIGATION            Patient Active Problem List    Diagnosis Date Noted   Paroxysmal atrial fibrillation (HCC) 08/08/2022   Hypercoagulable state due to paroxysmal atrial fibrillation (HCC) 08/08/2022   Cerebral edema (HCC) 08/07/2021   OSA (obstructive sleep apnea) 08/07/2021   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   CVA (cerebral vascular accident) (HCC) 08/01/2021   GERD (gastroesophageal reflux disease) 07/20/2019   Advanced care planning/counseling discussion 12/17/2016   Skin lesions, generalized 11/13/2016   Chronic right hip pain 06/17/2016  Vitamin D deficiency 09/26/2015   Diastasis recti 08/08/2015   Elevated serum GGT level 07/24/2015   Essential hypertension 07/17/2015   Fatty liver 07/17/2015   Elevated alkaline phosphatase level 07/17/2015   Elevated serum glutamic pyruvic transaminase (SGPT) level 07/17/2015   Abnormal finding on EKG 07/11/2015   Morbid obesity (HCC) 07/11/2015   Colon, diverticulosis 07/11/2015   Abdominal wall hernia 07/11/2015   DM (diabetes mellitus), type 2 with neurological complications (HCC)     Hyperlipidemia        ONSET DATE: 08/01/2021   REFERRING DIAG: CVA   THERAPY DIAG:  Muscle weakness (generalized)   Rationale for Evaluation and Treatment Rehabilitation   SUBJECTIVE:    SUBJECTIVE STATEMENT:    Pt. reports that she is doing well today and that her grandson is staying with her this week.   Pt accompanied by:  self   PERTINENT HISTORY:  Pt. is a 77 y.o. female who had a unexpectedly hospitalized from 2/12-2/14/2024 for COVID-19, and a UTI.  Pt. 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 Pt. attended inpatient rehab from 08/07/2021-08/22/2021. Pt. received Home health therapy services this past spring.  Pt. has recently had an assessment through driver rehabilitation services at [facility] with recommendations for referrals for  outpatient OT/PT, and ST services. Pt. PMHx includes: HTN, Hyperlipidemia, Macular degeneration, DM, and Obesity.   PRECAUTIONS: None   WEIGHT BEARING RESTRICTIONS No   PAIN:  Are you having pain?  0.5/10 pain in LUE and intermittent pain in neck region   FALLS: Has patient fallen in last 6 months? Yes.   LIVING ENVIRONMENT: Lives with: lives with their family  Son  [name] Lives in: House/apartment Main living are on one floor Stairs:  2 steps to enter Has following equipment at home: Single point cane, Wheelchair (manual), shower chair, Grab bars, bed rail, and rubber mat.   PLOF: Independent   PATIENT GOALS: To be able to Drive, and cook again   OBJECTIVE:    HAND DOMINANCE: Right    ADLs:  Transfers/ambulation related to ADLs: Independent Eating: Independent Grooming: [employer] Pt. reports being unable to use her left hand to perform haircare.  UB Dressing: Independent pullover shirt, independent now with fastening a bra LB Dressing: Independent donning pants socks, and slide on shoes Toileting: Independent Bathing: Independent Tub Shower transfers: Walk-in shower Independent now     IADLs: Shopping: Needs to be accompanied to the grocery store. Light housekeeping: Do  Meal Prep:  Is able to perform light meal prep Community mobility: Relies on son, and friend Medication management: Son sets up Mining engineer management:TBD Handwriting: TBD   MOBILITY STATUS: Hx of falls out of bed     ACTIVITY  TOLERANCE: Activity tolerance:  10-20 min. Before rest break   FUNCTIONAL OUTCOME MEASURES: FOTO: 61  TR score: 62  10/09/2022: FOTO: 49   UPPER EXTREMITY ROM      Active ROM Right eval Left eval Left  10/09/2022 Left 10/23/22 Left 12/02/2022 Left 01/13/2023  Shoulder flexion WFL 54(74) scaption 0(55) 26(80) 32(82) 35(82)  Shoulder abduction WFL 58(75) 32(54) 32(50) 45(60) 47(70)  Shoulder adduction          Shoulder extension          Shoulder internal rotation          Shoulder external rotation          Elbow flexion [facility] WFL 145 145 145 145  Elbow extension [facility] WFL 10(0) 10(0) WFL  WFL  Wrist flexion WFL TBD [facility] WFL  Wrist extension WFL TBD [facility] WFL  Wrist ulnar deviation          Wrist radial deviation          Wrist pronation          Wrist supination          (Blank rows = not tested)     UPPER EXTREMITY MMT:      MMT Right eval Left eval Left 10/09/2022 Left 10/23/2022 Left 12/02/2022 Left 01/13/2023  Shoulder flexion 4+/5 2+/5 1/5 2-/5 2-/5 2-/5  Shoulder abduction 4+/5 2+/5 2-/5 2-/5 2-/5 2-/5  Shoulder adduction          Shoulder extension          Shoulder internal rotation          Shoulder external rotation          Middle trapezius          Lower trapezius          Elbow flexion 4+/5 3+/5 3/5 3/5 3+/5 3+/5  Elbow extension 4+/5 3+/5 3/5 3/5 3+/5 3+/5  Wrist flexion 4+/5 TBD 3/5 3/5 3+/5 3+/5  Wrist extension 4+/5 TBD 3/5 3/5 3+/5 3+/5  Wrist ulnar deviation          Wrist radial deviation          Wrist pronation          Wrist supination          (Blank rows = not tested)   HAND FUNCTION: Grip strength: Right: 33 lbs; Left: 11 lbs, Lateral pinch: Right: 17 lbs, Left: 2 lbs, and 3 point pinch: Right: 17 lbs, Left: 2 lbs  10/09/2022: Grip strength: Right: 33 lbs; Left: 10 lbs, Lateral pinch: Right: 17 lbs, Left: 1 lbs, and 3 point pinch: Right: 17 lbs, Left: 1 lbs   10/23/2022: Grip strength: Right: 33 lbs; Left: 16 lbs, Lateral pinch:  Right: 17 lbs, Left: 1 lbs, and 3 point pinch: Right: 17 lbs, Left: 1 lbs  12/02/2022: Grip strength: Right: 33 lbs; Left: 17 lbs, Lateral pinch: Right: 17 lbs, Left: 7 lbs, and 3 point pinch: Right: 17 lbs, Left: 3 lbs   01/13/2023: Grip strength: Right: 33 lbs; Left: 15 lbs, Lateral pinch: Right: 17 lbs, Left: 9 lbs, and 3 point pinch: Right: 17 lbs, Left: 7 lbs     COORDINATION:   9 Hole Peg test: Right: 27 sec.  sec; Left: 52 sec.  10/09/2022: 9 Hole Peg test: Right: 27 sec.  sec; Left: 1 min. & 35 sec.  01/13/2023: 9 Hole Peg test: Right: 27 sec.  sec; Left: 51. Sec.  SENSATION: Light touch: WFL Proprioception: WFL   COGNITION: Overall cognitive status: Within functional limits for tasks assessed   VISION: Subjective report: Glasses. Just received a new prescription to increase bifocal strength Baseline vision: Macular Degeneration Visual history: macular degeneration   VISION ASSESSMENT: To be further assessed in functional context  Left sided Awareness      PERCEPTION: Limited left sided awareness     TODAY'S TREATMENT:   Therapeutic Exercise:   Pt. tolerated PROM in all joint ranges of the left shoulder. Shoulder flexion and abduction at the tabletop surface. Pt. worked on [employer], and reciprocal motion using the UBE while seated for 8 min. with no resistance initially, and the resistance gradually increasing. Pt. performed left shoulder flexion, and abduction at the tabletop surface.  Neuromuscular re-education:   Pt. worked on [facility] skills by using tip pinch to pick up grooved pegs followed by translatory skills moving the items from the palm of the hand to the tip of the 2nd digit, and thumb before manipulating them into the correct position to fit onto the peg board. Pt. Removed grooved pegs using tip pinch followed by translatory movements to discard them back into the peg dish.    Manual Therapy:  Pt. tolerated scapular mobilizations in elevation,  depression, abduction/rotation 2/2 tightness. Manual therapy was performed independent of, and in preparation for ROM.     PATIENT EDUCATION: Education details: Compensatory strategies during typing. Person educated: Patient Education method: verbal cues Education comprehension: verbalized understanding and pt does not plan to cook unless son is present.     HOME EXERCISE PROGRAM:  Reviewed home activities to enhance left hand digit extension  during typing skills.   GOALS: Goals reviewed with patient? Yes     SHORT TERM GOALS: Target date: 02/24/2023     Pt. Will improve FOTO score by 2 points to reflect improved pt. perceived functional performance Baseline: 01/13/2023: 49 12/01/2022: FOTO: 51 10/23/2022: FOTO 49 10/09/2022: FOTO 49, FOTO: 61 TR score: 62  Goal status: INITIAL   LONG TERM GOALS: Target date:04/07/2023  Pt. Will improve left shoulder ROM by 10 degrees to be able able to efficiently apply deodorant Baseline: 01/13/2023:  Shoulder flexion: 35(82), Abduction: 47(70) 12/02/2022: Shoulder flexion: 32(82), Abduction: 45(60)  10/23/2022: Shoulder flexion: 26(80), Abduction: 32(50) 10/09/2022: flexion: 0(55), Abduction: 32(54) Goal status: Improving, but very limited.   2.  Pt. will be able to independently hold and use a blow dryer, and brush for hair care. Baseline: 01/13/2023: Pt. Can hold the hair dryer in the left hand, however is unable to reach up to dry it, and has difficulty using a brush. 12/02/2022: Pt. is unable to actively elevate he left shoulder in order to blow dry her hair. 10/23/2022: Pt. continues to be unable to hold a blow dryer. Eval: Pt. Is unable to sustain her BUEs in elevation, and use a blow dryer, and brush. 10/09/2022: Pt. Is unable to hold a blow dryer. Eval: Pt. Is unable to sustain her BUEs in elevation, and use a blow dryer, and brush. Goal status: Ongoing   3.  Pt. Will improve left lateral pinch strength by 3# to be able to independently cut  meat. Baseline: 01/13/2023: Left lateral pinch strength: 9#  12/02/2022: Left pinch: 7# Pt. has difficulty cutting meat. 10/23/2022: Pt. Continues to present with difficulty cutting meat. 10/09/2022: 1#  Eval: 2#. Pt. has difficulty stabilizing utensil, and food while cutting food. Goal status: Ongoing   4.  Pt. Will improve Left hand [facility] skills to be able to be able to independently, and efficiently manipulate small objects during ADL tasks. Baseline 01/13/2023: 51 sec. 12/02/2022: TBD  10/23/2022: Pt. Continues to present with difficulty manipulating small objects. 10/09/2022: Left: 1 min & 35 sec.: Eval: Left: 52 sec. Right: 27 sec. Goal status: Ongoing     6.: Pt. will improve with left grip strength to be able to hold and pull up covers. Baseline: 01/13/2023: left  grip strength: 15# 12/02/2022: 17# 10/23/2022: Left: 16# 10/09/2022: Left: 10# Eval:  Left grip strength 11#. Pt. has difficulty holding a full drink  securely with the left hand.             Goal status: Ongoing   7. Pt. will improve typing speed, and accuracy in preparation  for efficiently typing simple email message. Baseline: 01/13/2023: TBD 12/02/2022:  Pt. continues to present with limited speed, and accuracy with typing.10/23/2022: Pt. continues to present with limited speed, and accuracy with typing. 10/09/2022: Pt. presents with difficulty typing. Pt. presents with difficulty typing an email. Typing speed, and accuracy TBD             Goal status: INITIAL                                        CLINICAL IMPRESSION:  Pt. reports that her insurance still has not approved an MRI for her shoulder. Pt. continues to present with left shoulder stiffness upon arrival, responds well to treatment, and presented with less stiffness following treatment. Left shoulder ROM is still very limited. Pt. was able to perform translatory movements with grooved pegs however required increased time. Pt. Was able to place 2 full rows of grooved pegs onto the  peg board. Pt. continues to present with difficulty using the left hand during hair care, applying deodorant, cutting meat, pulling up covers, and typing an email message. Pt. continues to benefit from OT services to work towards improving Left UE functioning in order to maximize engagement in, and overall independence with ADLs, and IADL tasks.    PERFORMANCE DEFICITS in functional skills including ADLs, IADLs, coordination, dexterity, sensation, ROM, strength, FMC, decreased knowledge of use of DME, vision, and UE functional use, cognitive skills including attention, and psychosocial skills including coping strategies, environmental adaptation, habits, and routines and behaviors.    IMPAIRMENTS are limiting patient from ADLs, IADLs, and social participation.    COMORBIDITIES may have co-morbidities  that affects occupational performance. Patient will benefit from skilled OT to address above impairments and improve overall function.   MODIFICATION OR ASSISTANCE TO COMPLETE EVALUATION: Min-Moderate modification of tasks or assist with assess necessary to complete an evaluation.   OT OCCUPATIONAL PROFILE AND HISTORY: Detailed assessment: Review of records and additional review of physical, cognitive, psychosocial history related to current functional performance.   CLINICAL DECISION MAKING: Moderate - several treatment options, min-mod task modification necessary   REHAB POTENTIAL: Good   EVALUATION COMPLEXITY: Moderate      PLAN: OT FREQUENCY: 2x/week   OT DURATION: 12 weeks   PLANNED INTERVENTIONS: self care/ADL training, therapeutic exercise, therapeutic activity, neuromuscular re-education, manual therapy, passive range of motion, and paraffin   RECOMMENDED OTHER SERVICES: ST, and PT   CONSULTED AND AGREED WITH PLAN OF CARE: Patient   PLAN FOR NEXT SESSION:  Left hand strengthening and coordination exercises    Herma Carson, OTS 11:11 AM 02/10/2023  This entire session was  performed under the direct supervision and direction of a licensed therapist. I have personally read, edited, and approve of the note as written.   Olegario Messier, MS, OTR/L  02/10/2023

## 2023-02-12 ENCOUNTER — Ambulatory Visit: Payer: Medicare HMO | Admitting: Physical Therapy

## 2023-02-12 ENCOUNTER — Ambulatory Visit: Payer: Medicare HMO | Admitting: Occupational Therapy

## 2023-02-12 DIAGNOSIS — R278 Other lack of coordination: Secondary | ICD-10-CM

## 2023-02-12 DIAGNOSIS — M6281 Muscle weakness (generalized): Secondary | ICD-10-CM | POA: Diagnosis not present

## 2023-02-12 NOTE — Therapy (Addendum)
Occupational Therapy Neuro Treatment Note     Patient Name: Alyssa Mayo MRN: [id number] DOB:[birth year], 77 y.o., female Today's Date: 03/14/2022   PCP: [name], MD REFERRING PROVIDER: [name], NP     OT End of Session - 02/12/23 1019     Visit Number 37    Number of Visits 48    Date for OT Re-Evaluation 04/07/23    OT Start Time 1015    OT Stop Time 1100    OT Time Calculation (min) 45 min    Activity Tolerance Patient tolerated treatment well    Behavior During Therapy [facility] for tasks assessed/performed                                           Past Medical History:  Diagnosis Date   Abnormal levels of other serum enzymes 07/17/2015   Arthritis     Constipation 07/11/2015   COVID-19 03/06/2021   Diabetes mellitus without complication (HCC)     Elevated liver enzymes     Fatty liver     Generalized abdominal pain 07/11/2015   Hyperlipidemia     Hypertension     Lifelong obesity     Macular degeneration     Morbid obesity due to excess calories (HCC) 07/11/2015   Other fatigue 07/11/2015   Sleep apnea     Spinal headache           Past Surgical History:  Procedure Laterality Date   ABDOMINAL HYSTERECTOMY       APPENDECTOMY       CATARACT EXTRACTION       CERVICAL LAMINECTOMY   07/21/1985   CESAREAN SECTION       COLONOSCOPY   07/21/2012    diverticulosis, [facility] Dr. [name]    COLONOSCOPY WITH PROPOFOL N/A 02/04/2019    Procedure: COLONOSCOPY WITH PROPOFOL;  Surgeon: [name], MD;  Location: [facility] ENDOSCOPY;  Service: Endoscopy;  Laterality: N/A;   COLONOSCOPY WITH PROPOFOL N/A 03/18/2021    Procedure: COLONOSCOPY WITH PROPOFOL;  Surgeon: [name], MD;  Location: [facility] ENDOSCOPY;  Service: Endoscopy;  Laterality: N/A;  DM   DIAGNOSTIC LAPAROSCOPY       ESOPHAGOGASTRODUODENOSCOPY (EGD) WITH PROPOFOL N/A 02/04/2019    Procedure: ESOPHAGOGASTRODUODENOSCOPY (EGD) WITH PROPOFOL;  Surgeon: [name], MD;   Location: [facility] ENDOSCOPY;  Service: Endoscopy;  Laterality: N/A;   ESOPHAGOGASTRODUODENOSCOPY (EGD) WITH PROPOFOL N/A 03/18/2021    Procedure: ESOPHAGOGASTRODUODENOSCOPY (EGD) WITH PROPOFOL;  Surgeon: [name], MD;  Location: [facility] ENDOSCOPY;  Service: Endoscopy;  Laterality: N/A;   LOOP RECORDER INSERTION N/A 08/05/2021    Procedure: LOOP RECORDER INSERTION;  Surgeon: [name], MD;  Location: [facility] INVASIVE CV LAB;  Service: Cardiovascular;  Laterality: N/A;   SPINE SURGERY       TUBAL LIGATION            Patient Active Problem List    Diagnosis Date Noted   Paroxysmal atrial fibrillation (HCC) 08/08/2022   Hypercoagulable state due to paroxysmal atrial fibrillation (HCC) 08/08/2022   Cerebral edema (HCC) 08/07/2021   OSA (obstructive sleep apnea) 08/07/2021   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   CVA (cerebral vascular accident) (HCC) 08/01/2021   GERD (gastroesophageal reflux disease) 07/20/2019   Advanced care planning/counseling discussion 12/17/2016   Skin lesions, generalized 11/13/2016   Chronic right hip pain 06/17/2016  Vitamin D deficiency 09/26/2015   Diastasis recti 08/08/2015   Elevated serum GGT level 07/24/2015   Essential hypertension 07/17/2015   Fatty liver 07/17/2015   Elevated alkaline phosphatase level 07/17/2015   Elevated serum glutamic pyruvic transaminase (SGPT) level 07/17/2015   Abnormal finding on EKG 07/11/2015   Morbid obesity (HCC) 07/11/2015   Colon, diverticulosis 07/11/2015   Abdominal wall hernia 07/11/2015   DM (diabetes mellitus), type 2 with neurological complications (HCC)     Hyperlipidemia        ONSET DATE: 08/01/2021   REFERRING DIAG: CVA   THERAPY DIAG:  Muscle weakness (generalized)   Rationale for Evaluation and Treatment Rehabilitation   SUBJECTIVE:    SUBJECTIVE STATEMENT:    Pt. reports she is doing okay today. Pt. reports that her MRI request was denied.   Pt accompanied by: self    PERTINENT HISTORY:  Pt. is a 77 y.o. female who had a unexpectedly hospitalized from 2/12-2/14/2024 for COVID-19, and a UTI.  Pt. 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 Pt. attended inpatient rehab from 08/07/2021-08/22/2021. Pt. received Home health therapy services this past spring.  Pt. has recently had an assessment through driver rehabilitation services at [facility] with recommendations for referrals for  outpatient OT/PT, and ST services. Pt. PMHx includes: HTN, Hyperlipidemia, Macular degeneration, DM, and Obesity.   PRECAUTIONS: None   WEIGHT BEARING RESTRICTIONS No   PAIN:  Are you having pain?  1/10 pain in LUE and intermittent pain in neck region   FALLS: Has patient fallen in last 6 months? Yes.   LIVING ENVIRONMENT: Lives with: lives with their family  Son  [name] Lives in: House/apartment Main living are on one floor Stairs:  2 steps to enter Has following equipment at home: Single point cane, Wheelchair (manual), shower chair, Grab bars, bed rail, and rubber mat.   PLOF: Independent   PATIENT GOALS: To be able to Drive, and cook again   OBJECTIVE:    HAND DOMINANCE: Right    ADLs:  Transfers/ambulation related to ADLs: Independent Eating: Independent Grooming: [employer] Pt. reports being unable to use her left hand to perform haircare.  UB Dressing: Independent pullover shirt, independent now with fastening a bra LB Dressing: Independent donning pants socks, and slide on shoes Toileting: Independent Bathing: Independent Tub Shower transfers: Walk-in shower Independent now     IADLs: Shopping: Needs to be accompanied to the grocery store. Light housekeeping: Do  Meal Prep:  Is able to perform light meal prep Community mobility: Relies on son, and friend Medication management: Son sets up Mining engineer management:TBD Handwriting: TBD   MOBILITY STATUS: Hx of falls out of bed     ACTIVITY  TOLERANCE: Activity tolerance:  10-20 min. Before rest break   FUNCTIONAL OUTCOME MEASURES: FOTO: 61  TR score: 62  10/09/2022: FOTO: 49   UPPER EXTREMITY ROM      Active ROM Right eval Left eval Left  10/09/2022 Left 10/23/22 Left 12/02/2022 Left 01/13/2023  Shoulder flexion WFL 54(74) scaption 0(55) 26(80) 32(82) 35(82)  Shoulder abduction WFL 58(75) 32(54) 32(50) 45(60) 47(70)  Shoulder adduction          Shoulder extension          Shoulder internal rotation          Shoulder external rotation          Elbow flexion [facility] WFL 145 145 145 145  Elbow extension [facility] WFL 10(0) 10(0) WFL WFL  Wrist  flexion WFL TBD [facility] WFL  Wrist extension WFL TBD [facility] WFL  Wrist ulnar deviation          Wrist radial deviation          Wrist pronation          Wrist supination          (Blank rows = not tested)     UPPER EXTREMITY MMT:      MMT Right eval Left eval Left 10/09/2022 Left 10/23/2022 Left 12/02/2022 Left 01/13/2023  Shoulder flexion 4+/5 2+/5 1/5 2-/5 2-/5 2-/5  Shoulder abduction 4+/5 2+/5 2-/5 2-/5 2-/5 2-/5  Shoulder adduction          Shoulder extension          Shoulder internal rotation          Shoulder external rotation          Middle trapezius          Lower trapezius          Elbow flexion 4+/5 3+/5 3/5 3/5 3+/5 3+/5  Elbow extension 4+/5 3+/5 3/5 3/5 3+/5 3+/5  Wrist flexion 4+/5 TBD 3/5 3/5 3+/5 3+/5  Wrist extension 4+/5 TBD 3/5 3/5 3+/5 3+/5  Wrist ulnar deviation          Wrist radial deviation          Wrist pronation          Wrist supination          (Blank rows = not tested)   HAND FUNCTION: Grip strength: Right: 33 lbs; Left: 11 lbs, Lateral pinch: Right: 17 lbs, Left: 2 lbs, and 3 point pinch: Right: 17 lbs, Left: 2 lbs  10/09/2022: Grip strength: Right: 33 lbs; Left: 10 lbs, Lateral pinch: Right: 17 lbs, Left: 1 lbs, and 3 point pinch: Right: 17 lbs, Left: 1 lbs   10/23/2022: Grip strength: Right: 33 lbs; Left: 16 lbs, Lateral pinch:  Right: 17 lbs, Left: 1 lbs, and 3 point pinch: Right: 17 lbs, Left: 1 lbs  12/02/2022: Grip strength: Right: 33 lbs; Left: 17 lbs, Lateral pinch: Right: 17 lbs, Left: 7 lbs, and 3 point pinch: Right: 17 lbs, Left: 3 lbs   01/13/2023: Grip strength: Right: 33 lbs; Left: 15 lbs, Lateral pinch: Right: 17 lbs, Left: 9 lbs, and 3 point pinch: Right: 17 lbs, Left: 7 lbs     COORDINATION:   9 Hole Peg test: Right: 27 sec.  sec; Left: 52 sec.  10/09/2022: 9 Hole Peg test: Right: 27 sec.  sec; Left: 1 min. & 35 sec.  01/13/2023: 9 Hole Peg test: Right: 27 sec.  sec; Left: 51. Sec.  SENSATION: Light touch: WFL Proprioception: WFL   COGNITION: Overall cognitive status: Within functional limits for tasks assessed   VISION: Subjective report: Glasses. Just received a new prescription to increase bifocal strength Baseline vision: Macular Degeneration Visual history: macular degeneration   VISION ASSESSMENT: To be further assessed in functional context  Left sided Awareness      PERCEPTION: Limited left sided awareness     TODAY'S TREATMENT:   Therapeutic Exercise:   Pt. tolerated PROM in all joint ranges of the left shoulder. Shoulder flexion and abduction at the tabletop surface. Pt. worked on [employer], and reciprocal motion using the UBE while seated for 8 min. with no resistance initially, and the resistance gradually increasing. Pt. performed left shoulder flexion, and abduction at the tabletop surface.   Neuromuscular re-education:  Pt. worked on [facility] skills by using tip pinch to pick up grooved pegs followed by translatory skills moving the items from the palm of the hand to the tip of the 2nd digit, and thumb before manipulating them into the correct position to fit onto the peg board. Pt. Removed grooved pegs using tip pinch and thumb opposition alternating from the 2nd-5th digit.  Manual Therapy:  Pt. tolerated scapular mobilizations in elevation, depression,  abduction/rotation 2/2 tightness. Manual therapy was performed independent of, and in preparation for ROM.     PATIENT EDUCATION: Education details: Compensatory strategies during typing. Person educated: Patient Education method: verbal cues Education comprehension: verbalized understanding and pt does not plan to cook unless son is present.     HOME EXERCISE PROGRAM:  Reviewed home activities to enhance left hand digit extension  during typing skills.   GOALS: Goals reviewed with patient? Yes     SHORT TERM GOALS: Target date: 02/24/2023     Pt. Will improve FOTO score by 2 points to reflect improved pt. perceived functional performance Baseline: 01/13/2023: 49 12/01/2022: FOTO: 51 10/23/2022: FOTO 49 10/09/2022: FOTO 49, FOTO: 61 TR score: 62  Goal status: INITIAL   LONG TERM GOALS: Target date:04/07/2023  Pt. Will improve left shoulder ROM by 10 degrees to be able able to efficiently apply deodorant Baseline: 01/13/2023:  Shoulder flexion: 35(82), Abduction: 47(70) 12/02/2022: Shoulder flexion: 32(82), Abduction: 45(60)  10/23/2022: Shoulder flexion: 26(80), Abduction: 32(50) 10/09/2022: flexion: 0(55), Abduction: 32(54) Goal status: Improving, but very limited.   2.  Pt. will be able to independently hold and use a blow dryer, and brush for hair care. Baseline: 01/13/2023: Pt. Can hold the hair dryer in the left hand, however is unable to reach up to dry it, and has difficulty using a brush. 12/02/2022: Pt. is unable to actively elevate he left shoulder in order to blow dry her hair. 10/23/2022: Pt. continues to be unable to hold a blow dryer. Eval: Pt. Is unable to sustain her BUEs in elevation, and use a blow dryer, and brush. 10/09/2022: Pt. Is unable to hold a blow dryer. Eval: Pt. Is unable to sustain her BUEs in elevation, and use a blow dryer, and brush. Goal status: Ongoing   3.  Pt. Will improve left lateral pinch strength by 3# to be able to independently cut meat. Baseline:  01/13/2023: Left lateral pinch strength: 9#  12/02/2022: Left pinch: 7# Pt. has difficulty cutting meat. 10/23/2022: Pt. Continues to present with difficulty cutting meat. 10/09/2022: 1#  Eval: 2#. Pt. has difficulty stabilizing utensil, and food while cutting food. Goal status: Ongoing   4.  Pt. Will improve Left hand [facility] skills to be able to be able to independently, and efficiently manipulate small objects during ADL tasks. Baseline 01/13/2023: 51 sec. 12/02/2022: TBD  10/23/2022: Pt. Continues to present with difficulty manipulating small objects. 10/09/2022: Left: 1 min & 35 sec.: Eval: Left: 52 sec. Right: 27 sec. Goal status: Ongoing     6.: Pt. will improve with left grip strength to be able to hold and pull up covers. Baseline: 01/13/2023: left  grip strength: 15# 12/02/2022: 17# 10/23/2022: Left: 16# 10/09/2022: Left: 10# Eval:  Left grip strength 11#. Pt. has difficulty holding a full drink  securely with the left hand.             Goal status: Ongoing   7. Pt. will improve typing speed, and accuracy in preparation for efficiently typing simple email message. Baseline: 01/13/2023: TBD 12/02/2022:  Pt. continues to present with limited speed, and accuracy with typing.10/23/2022: Pt. continues to present with limited speed, and accuracy with typing. 10/09/2022: Pt. presents with difficulty typing. Pt. presents with difficulty typing an email. Typing speed, and accuracy TBD             Goal status: INITIAL                                        CLINICAL IMPRESSION:  Pt. reports that her insurance denied the MRI request. Pt. Was encouraged to reach out to Dr. Lenard Forth to ensure insurance company receives information needed to be approved for MRI.  Pt. continues to present with left shoulder stiffness upon arrival, responds well to treatment, and presented with less stiffness following treatment. Left shoulder ROM is still very limited. Pt. Tolerated all therapeutic exercise well. Pt. was able to perform  translatory movements with grooved pegs however continues to require increased time. Pt. Was able to place 2 1/2  full rows of grooved pegs onto the peg board. Pt. continues to present with difficulty using the left hand during hair care, applying deodorant, cutting meat, pulling up covers, and typing an email message. Pt. continues to benefit from OT services to work towards improving Left UE functioning in order to maximize engagement in, and overall independence with ADLs, and IADL tasks.    PERFORMANCE DEFICITS in functional skills including ADLs, IADLs, coordination, dexterity, sensation, ROM, strength, FMC, decreased knowledge of use of DME, vision, and UE functional use, cognitive skills including attention, and psychosocial skills including coping strategies, environmental adaptation, habits, and routines and behaviors.    IMPAIRMENTS are limiting patient from ADLs, IADLs, and social participation.    COMORBIDITIES may have co-morbidities  that affects occupational performance. Patient will benefit from skilled OT to address above impairments and improve overall function.   MODIFICATION OR ASSISTANCE TO COMPLETE EVALUATION: Min-Moderate modification of tasks or assist with assess necessary to complete an evaluation.   OT OCCUPATIONAL PROFILE AND HISTORY: Detailed assessment: Review of records and additional review of physical, cognitive, psychosocial history related to current functional performance.   CLINICAL DECISION MAKING: Moderate - several treatment options, min-mod task modification necessary   REHAB POTENTIAL: Good   EVALUATION COMPLEXITY: Moderate      PLAN: OT FREQUENCY: 2x/week   OT DURATION: 12 weeks   PLANNED INTERVENTIONS: self care/ADL training, therapeutic exercise, therapeutic activity, neuromuscular re-education, manual therapy, passive range of motion, and paraffin   RECOMMENDED OTHER SERVICES: ST, and PT   CONSULTED AND AGREED WITH PLAN OF CARE: Patient    PLAN FOR NEXT SESSION:  Left hand strengthening and coordination exercises    Herma Carson, OTS 10:50 AM 02/12/2023  This entire session was performed under the direct supervision and direction of a licensed therapist. I have personally read, edited, and approve of the note as written.   Olegario Messier, MS, OTR/L  02/12/2023

## 2023-02-13 NOTE — Progress Notes (Signed)
Carelink Summary Report / Loop Recorder 

## 2023-02-17 ENCOUNTER — Ambulatory Visit: Payer: Medicare HMO | Admitting: Physical Therapy

## 2023-02-17 ENCOUNTER — Ambulatory Visit: Payer: Medicare HMO | Admitting: Occupational Therapy

## 2023-02-19 ENCOUNTER — Ambulatory Visit: Payer: Medicare HMO | Admitting: Physical Therapy

## 2023-02-19 ENCOUNTER — Ambulatory Visit: Payer: Medicare HMO | Admitting: Occupational Therapy

## 2023-02-24 ENCOUNTER — Ambulatory Visit: Payer: Medicare HMO | Attending: Adult Health

## 2023-02-24 ENCOUNTER — Ambulatory Visit: Payer: Medicare HMO | Admitting: Physical Therapy

## 2023-02-24 DIAGNOSIS — R278 Other lack of coordination: Secondary | ICD-10-CM

## 2023-02-24 DIAGNOSIS — M6281 Muscle weakness (generalized): Secondary | ICD-10-CM | POA: Diagnosis not present

## 2023-02-24 NOTE — Therapy (Addendum)
Occupational Therapy Neuro Treatment Note     Patient Name: Alyssa Mayo MRN: [id number] DOB:May 26, 1946, 77 y.o., female Today's Date: 03/14/2022   PCP: [name], MD REFERRING PROVIDER: [name], NP     OT End of Session - 02/24/23 1114     Visit Number 38    Number of Visits 48    Date for OT Re-Evaluation 04/07/23    OT Start Time 1102    OT Stop Time 1145    OT Time Calculation (min) 43 min    Activity Tolerance Patient tolerated treatment well    Behavior During Therapy [facility] for tasks assessed/performed                                           Past Medical History:  Diagnosis Date   Abnormal levels of other serum enzymes 07/17/2015   Arthritis     Constipation 07/11/2015   COVID-19 03/06/2021   Diabetes mellitus without complication (HCC)     Elevated liver enzymes     Fatty liver     Generalized abdominal pain 07/11/2015   Hyperlipidemia     Hypertension     Lifelong obesity     Macular degeneration     Morbid obesity due to excess calories (HCC) 07/11/2015   Other fatigue 07/11/2015   Sleep apnea     Spinal headache           Past Surgical History:  Procedure Laterality Date   ABDOMINAL HYSTERECTOMY       APPENDECTOMY       CATARACT EXTRACTION       CERVICAL LAMINECTOMY   07/21/1985   CESAREAN SECTION       COLONOSCOPY   07/21/2012    diverticulosis, [facility] Dr. [name]    COLONOSCOPY WITH PROPOFOL N/A 02/04/2019    Procedure: COLONOSCOPY WITH PROPOFOL;  Surgeon: [name], MD;  Location: [facility] ENDOSCOPY;  Service: Endoscopy;  Laterality: N/A;   COLONOSCOPY WITH PROPOFOL N/A 03/18/2021    Procedure: COLONOSCOPY WITH PROPOFOL;  Surgeon: [name], MD;  Location: [facility] ENDOSCOPY;  Service: Endoscopy;  Laterality: N/A;  DM   DIAGNOSTIC LAPAROSCOPY       ESOPHAGOGASTRODUODENOSCOPY (EGD) WITH PROPOFOL N/A 02/04/2019    Procedure: ESOPHAGOGASTRODUODENOSCOPY (EGD) WITH PROPOFOL;  Surgeon: [name], MD;   Location: [facility] ENDOSCOPY;  Service: Endoscopy;  Laterality: N/A;   ESOPHAGOGASTRODUODENOSCOPY (EGD) WITH PROPOFOL N/A 03/18/2021    Procedure: ESOPHAGOGASTRODUODENOSCOPY (EGD) WITH PROPOFOL;  Surgeon: [name], MD;  Location: [facility] ENDOSCOPY;  Service: Endoscopy;  Laterality: N/A;   LOOP RECORDER INSERTION N/A 08/05/2021    Procedure: LOOP RECORDER INSERTION;  Surgeon: [name], MD;  Location: [facility] INVASIVE CV LAB;  Service: Cardiovascular;  Laterality: N/A;   SPINE SURGERY       TUBAL LIGATION            Patient Active Problem List    Diagnosis Date Noted   Paroxysmal atrial fibrillation (HCC) 08/08/2022   Hypercoagulable state due to paroxysmal atrial fibrillation (HCC) 08/08/2022   Cerebral edema (HCC) 08/07/2021   OSA (obstructive sleep apnea) 08/07/2021   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   CVA (cerebral vascular accident) (HCC) 08/01/2021   GERD (gastroesophageal reflux disease) 07/20/2019   Advanced care planning/counseling discussion 12/17/2016   Skin lesions, generalized 11/13/2016   Chronic right hip pain 06/17/2016  Vitamin D deficiency 09/26/2015   Diastasis recti 08/08/2015   Elevated serum GGT level 07/24/2015   Essential hypertension 07/17/2015   Fatty liver 07/17/2015   Elevated alkaline phosphatase level 07/17/2015   Elevated serum glutamic pyruvic transaminase (SGPT) level 07/17/2015   Abnormal finding on EKG 07/11/2015   Morbid obesity (HCC) 07/11/2015   Colon, diverticulosis 07/11/2015   Abdominal wall hernia 07/11/2015   DM (diabetes mellitus), type 2 with neurological complications (HCC)     Hyperlipidemia      ONSET DATE: 08/01/2021   REFERRING DIAG: CVA   THERAPY DIAG:  Muscle weakness (generalized)   Rationale for Evaluation and Treatment Rehabilitation   SUBJECTIVE:    SUBJECTIVE STATEMENT:   Pt. reports she is doing okay today. Pt. reports that she had to miss last week because she had a headache. Pt. Reports  she is feeling tired today.  Pt accompanied by: self   PERTINENT HISTORY:  Pt. is a 76 y.o. female who had a unexpectedly hospitalized from 2/12-2/14/2024 for COVID-19, and a UTI.  Pt. 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 Pt. attended inpatient rehab from 08/07/2021-08/22/2021. Pt. received Home health therapy services this past spring.  Pt. has recently had an assessment through driver rehabilitation services at [facility] with recommendations for referrals for  outpatient OT/PT, and ST services. Pt. PMHx includes: HTN, Hyperlipidemia, Macular degeneration, DM, and Obesity.   PRECAUTIONS: None   WEIGHT BEARING RESTRICTIONS No   PAIN:  Are you having pain?  4/10 pain in LUE   FALLS: Has patient fallen in last 6 months? Yes.   LIVING ENVIRONMENT: Lives with: lives with their family  Son  [name] Lives in: House/apartment Main living are on one floor Stairs:  2 steps to enter Has following equipment at home: Single point cane, Wheelchair (manual), shower chair, Grab bars, bed rail, and rubber mat.   PLOF: Independent   PATIENT GOALS: To be able to Drive, and cook again   OBJECTIVE:    HAND DOMINANCE: Right    ADLs:  Transfers/ambulation related to ADLs: Independent Eating: Independent Grooming: [employer] Pt. reports being unable to use her left hand to perform haircare.  UB Dressing: Independent pullover shirt, independent now with fastening a bra LB Dressing: Independent donning pants socks, and slide on shoes Toileting: Independent Bathing: Independent Tub Shower transfers: Walk-in shower Independent now    IADLs: Shopping: Needs to be accompanied to the grocery store. Light housekeeping: Do  Meal Prep:  Is able to perform light meal prep Community mobility: Relies on son, and friend Medication management: Son sets up Mining engineer management:TBD Handwriting: TBD   MOBILITY STATUS: Hx of falls out of bed      ACTIVITY TOLERANCE: Activity tolerance:  10-20 min. Before rest break   FUNCTIONAL OUTCOME MEASURES: FOTO: 61  TR score: 62  10/09/2022: FOTO: 49   UPPER EXTREMITY ROM      Active ROM Right eval Left eval Left  10/09/2022 Left 10/23/22 Left 12/02/2022 Left 01/13/2023  Shoulder flexion WFL 54(74) scaption 0(55) 26(80) 32(82) 35(82)  Shoulder abduction WFL 58(75) 32(54) 32(50) 45(60) 47(70)  Shoulder adduction          Shoulder extension          Shoulder internal rotation          Shoulder external rotation          Elbow flexion [facility] WFL 145 145 145 145  Elbow extension [facility] WFL 10(0) 10(0) [facility] WFL  Wrist flexion WFL TBD [facility] WFL  Wrist extension WFL TBD [facility] WFL  Wrist ulnar deviation          Wrist radial deviation          Wrist pronation          Wrist supination          (Blank rows = not tested)     UPPER EXTREMITY MMT:      MMT Right eval Left eval Left 10/09/2022 Left 10/23/2022 Left 12/02/2022 Left 01/13/2023  Shoulder flexion 4+/5 2+/5 1/5 2-/5 2-/5 2-/5  Shoulder abduction 4+/5 2+/5 2-/5 2-/5 2-/5 2-/5  Shoulder adduction          Shoulder extension          Shoulder internal rotation          Shoulder external rotation          Middle trapezius          Lower trapezius          Elbow flexion 4+/5 3+/5 3/5 3/5 3+/5 3+/5  Elbow extension 4+/5 3+/5 3/5 3/5 3+/5 3+/5  Wrist flexion 4+/5 TBD 3/5 3/5 3+/5 3+/5  Wrist extension 4+/5 TBD 3/5 3/5 3+/5 3+/5  Wrist ulnar deviation          Wrist radial deviation          Wrist pronation          Wrist supination          (Blank rows = not tested)   HAND FUNCTION: Grip strength: Right: 33 lbs; Left: 11 lbs, Lateral pinch: Right: 17 lbs, Left: 2 lbs, and 3 point pinch: Right: 17 lbs, Left: 2 lbs  10/09/2022: Grip strength: Right: 33 lbs; Left: 10 lbs, Lateral pinch: Right: 17 lbs, Left: 1 lbs, and 3 point pinch: Right: 17 lbs, Left: 1 lbs   10/23/2022: Grip strength: Right: 33 lbs; Left: 16 lbs,  Lateral pinch: Right: 17 lbs, Left: 1 lbs, and 3 point pinch: Right: 17 lbs, Left: 1 lbs  12/02/2022: Grip strength: Right: 33 lbs; Left: 17 lbs, Lateral pinch: Right: 17 lbs, Left: 7 lbs, and 3 point pinch: Right: 17 lbs, Left: 3 lbs   01/13/2023: Grip strength: Right: 33 lbs; Left: 15 lbs, Lateral pinch: Right: 17 lbs, Left: 9 lbs, and 3 point pinch: Right: 17 lbs, Left: 7 lbs   COORDINATION:   9 Hole Peg test: Right: 27 sec.  sec; Left: 52 sec.  10/09/2022: 9 Hole Peg test: Right: 27 sec.  sec; Left: 1 min. & 35 sec.  01/13/2023: 9 Hole Peg test: Right: 27 sec.  sec; Left: 51. Sec.  SENSATION: Light touch: WFL Proprioception: WFL   COGNITION: Overall cognitive status: Within functional limits for tasks assessed   VISION: Subjective report: Glasses. Just received a new prescription to increase bifocal strength Baseline vision: Macular Degeneration Visual history: macular degeneration   VISION ASSESSMENT: To be further assessed in functional context  Left sided Awareness      PERCEPTION: Limited left sided awareness     TODAY'S TREATMENT:   Therapeutic Exercise:   Pt. tolerated Shoulder flexion and abduction at the tabletop surface. Pt. worked on [employer], and reciprocal motion using the UBE while seated for 8 min. with no resistance initially, and the resistance gradually increasing.  Pt. tolerated active scapular mobility for elevation, depression, abduction/rotation 2/2 tightness.   Neuromuscular re-education:   Pt. worked on [facility] skills by using  tip pinch to pick up 1" circular pegs followed by translatory skills moving the items from the palm of the hand to the tip of the 2nd digit, and thumb before manipulating them into the correct position to fit onto the vertical peg board. Pt. Removed 1" circular pegs using tip pinch and thumb opposition alternating from the 2nd-5th digit.   PATIENT EDUCATION: Education details: BUE strengthening and flexibility  exercises Person educated: Patient Education method: verbal cues Education comprehension: verbalized and demonstrated understanding; further training needed.    HOME EXERCISE PROGRAM:  Reviewed home activities to enhance left hand digit extension  during typing skills.   GOALS: Goals reviewed with patient? Yes     SHORT TERM GOALS: Target date: 02/24/2023     Pt. Will improve FOTO score by 2 points to reflect improved pt. perceived functional performance Baseline: 01/13/2023: 49 12/01/2022: FOTO: 51 10/23/2022: FOTO 49 10/09/2022: FOTO 49, FOTO: 61 TR score: 62  Goal status: INITIAL   LONG TERM GOALS: Target date:04/07/2023  Pt. Will improve left shoulder ROM by 10 degrees to be able able to efficiently apply deodorant Baseline: 01/13/2023:  Shoulder flexion: 35(82), Abduction: 47(70) 12/02/2022: Shoulder flexion: 32(82), Abduction: 45(60)  10/23/2022: Shoulder flexion: 26(80), Abduction: 32(50) 10/09/2022: flexion: 0(55), Abduction: 32(54) Goal status: Improving, but very limited.   2.  Pt. will be able to independently hold and use a blow dryer, and brush for hair care. Baseline: 01/13/2023: Pt. Can hold the hair dryer in the left hand, however is unable to reach up to dry it, and has difficulty using a brush. 12/02/2022: Pt. is unable to actively elevate he left shoulder in order to blow dry her hair. 10/23/2022: Pt. continues to be unable to hold a blow dryer. Eval: Pt. Is unable to sustain her BUEs in elevation, and use a blow dryer, and brush. 10/09/2022: Pt. Is unable to hold a blow dryer. Eval: Pt. Is unable to sustain her BUEs in elevation, and use a blow dryer, and brush. Goal status: Ongoing   3.  Pt. Will improve left lateral pinch strength by 3# to be able to independently cut meat. Baseline: 01/13/2023: Left lateral pinch strength: 9#  12/02/2022: Left pinch: 7# Pt. has difficulty cutting meat. 10/23/2022: Pt. Continues to present with difficulty cutting meat. 10/09/2022: 1#  Eval: 2#. Pt.  has difficulty stabilizing utensil, and food while cutting food. Goal status: Ongoing   4.  Pt. Will improve Left hand [facility] skills to be able to be able to independently, and efficiently manipulate small objects during ADL tasks. Baseline 01/13/2023: 51 sec. 12/02/2022: TBD  10/23/2022: Pt. Continues to present with difficulty manipulating small objects. 10/09/2022: Left: 1 min & 35 sec.: Eval: Left: 52 sec. Right: 27 sec. Goal status: Ongoing     6.: Pt. will improve with left grip strength to be able to hold and pull up covers. Baseline: 01/13/2023: left  grip strength: 15# 12/02/2022: 17# 10/23/2022: Left: 16# 10/09/2022: Left: 10# Eval:  Left grip strength 11#. Pt. has difficulty holding a full drink  securely with the left hand.             Goal status: Ongoing   7. Pt. will improve typing speed, and accuracy in preparation for efficiently typing simple email message. Baseline: 01/13/2023: TBD 12/02/2022:  Pt. continues to present with limited speed, and accuracy with typing.10/23/2022: Pt. continues to present with limited speed, and accuracy with typing. 10/09/2022: Pt. presents with difficulty typing. Pt. presents with difficulty typing an email. Typing  speed, and accuracy TBD             Goal status: INITIAL                                        CLINICAL IMPRESSION:  Pt. Reports she is feeling tired today. Pt. BP was taken before starting the session and was normal at 136/70. Pt. reports that she contacted her Dr. Isidore Moos About her MRI getting denied. Pt. reports the insurance company was sent the needed information and they are working on getting a time set up to do the MRI for the L shoulder.  Pt. continues to present with left shoulder stiffness upon arrival, responds well to treatment, and presented with less stiffness following treatment. Left shoulder ROM is still very limited. Pt. Tolerated therapeutic exercise well. Pt. was able to perform translatory movements with 1" circular pegs however  continues to require increased time. Pt. Was able to place 2 full rows of 1" circular pegs onto the peg board and remove them using thumb opposition and alternating between the 2nd-5th digits. Pt. continues to present with difficulty using the left hand during hair care, applying deodorant, cutting meat, pulling up covers, and typing an email message. Pt. continues to benefit from OT services to work towards improving Left UE functioning in order to maximize engagement in, and overall independence with ADLs, and IADL tasks.    PERFORMANCE DEFICITS in functional skills including ADLs, IADLs, coordination, dexterity, sensation, ROM, strength, FMC, decreased knowledge of use of DME, vision, and UE functional use, cognitive skills including attention, and psychosocial skills including coping strategies, environmental adaptation, habits, and routines and behaviors.    IMPAIRMENTS are limiting patient from ADLs, IADLs, and social participation.    COMORBIDITIES may have co-morbidities  that affects occupational performance. Patient will benefit from skilled OT to address above impairments and improve overall function.   MODIFICATION OR ASSISTANCE TO COMPLETE EVALUATION: Min-Moderate modification of tasks or assist with assess necessary to complete an evaluation.   OT OCCUPATIONAL PROFILE AND HISTORY: Detailed assessment: Review of records and additional review of physical, cognitive, psychosocial history related to current functional performance.   CLINICAL DECISION MAKING: Moderate - several treatment options, min-mod task modification necessary   REHAB POTENTIAL: Good   EVALUATION COMPLEXITY: Moderate      PLAN: OT FREQUENCY: 2x/week   OT DURATION: 12 weeks   PLANNED INTERVENTIONS: self care/ADL training, therapeutic exercise, therapeutic activity, neuromuscular re-education, manual therapy, passive range of motion, and paraffin   RECOMMENDED OTHER SERVICES: ST, and PT   CONSULTED AND AGREED  WITH PLAN OF CARE: Patient   PLAN FOR NEXT SESSION:  Left hand strengthening and coordination exercises   Herma Carson, OTS 11:53 AM 02/24/2023   This entire session was performed under direct supervision and direction of a licensed therapist/therapist assistant . I have personally read, edited and approve of the note as written.  Danelle Earthly, MS, OTR/L

## 2023-02-25 NOTE — Progress Notes (Signed)
[facility] [address] [place]. [place] [address] [phone]       STROKE FOLLOW UP NOTE  Alyssa Mayo Date of Birth:  03/24/1946 Medical Record Number:  [id number]   Reason for Referral: stroke follow up    SUBJECTIVE:   CHIEF COMPLAINT:  Chief Complaint  Patient presents with   Follow-up    Pt with son and grandson. Rm 3. Here for follow up. Overall stable denies concerns. Continues to work with OT. States that they are concerned ? Left rotator cuff repair. She is waiting to have a MRI    HPI:   Update 02/26/2023 JM: Patient returns for follow-up visit accompanied by her son and grandson.  Continued left arm weakness poststroke but injured left shoulder post fall in 09/2022.  Evaluated by Ortho back in June for further evaluation, concern of rotator cuff injury and recommended proceeding with MRI, was having issues with insurance but this has been resolved per patient, awaiting to be scheduled.  Continues working with OT.  Cognition has been overall stable, per patient has improved some, can have good days or bad days, use of Namenda 10 mg twice daily, tolerating well.  Remains on Eliquis and atorvastatin but per son, also on simvastatin. Routinely follows with PCP for stroke risk factor management, recent lab work satisfactory. Still not using CPAP, machine still not working correctly, has not yet contacted DME.     History provided for reference purposes only Update 08/25/2022 JM: Patient returns for 33-month stroke follow-up accompanied by her son.  Does have short-term memory loss since her stroke but son concerned this has been worsening especially over the past month. Can fluctuate day to day.  Was seen by PCP 1/29, completed lab work which was unremarkable. Was having difficulty sleeping but this has improved over the past 3 weeks. Does have OSA but unable to use CPAP over the past several weeks as machine not working correctly. Feels her appetite  is good. Mainly watches TV during the day. Does not do any routine memory exercises, completed SLP about 1 month ago as met maximal rehab potential. Has been doing chair yoga for 20-30 minutes most nights. Continues working with PT/OT for residual left-sided deficits and gait impairment, does feel some improvement since prior visit. Denies new stroke/TIA symptoms.   ILR showed evidence of A-fib in 06/2022, was placed on Eliquis and stop aspirin.  Remains on Eliquis without side effects as well as atorvastatin. Blood pressure 139/73  Update 02/20/2022 JM: Patient returns for 17-month stroke follow-up accompanied by her son.  Has been stable without new stroke/TIA symptoms.  Reports residual mild LUE weakness, occasional imbalance/gait unsteadiness but no recent falls, some slurred speech, and mild memory impairment with impaired attention/concentration - reports improvement since prior visit Completed HH PT/OT last week.  Plans on participating in a local gym for continued exercises.  Does have driving simulation test scheduled Monday. Son is still staying with her. Able to maintain majority of ADLs independently but does need assistance for IADLs.  Compliant on aspirin and atorvastatin, denies side effects Blood pressure today 148/60 - was being monitored by therapies and had been stable Loop recorder has not shown atrial fibrillation thus far Routinely monitored by PCP Dr. Burnett Sheng - plans on repeat labs 03/2022  Does have sleep apnea, has CPAP machine but reports it has not been working.  She has not yet reached out to DME company.  No further concerns at this time.  Initial visit  10/16/2021 JM: Patient being seen for initial hospital follow-up accompanied by her son, [name].  Doing well since discharge reporting residual left arm weakness, imbalance, occasional slurred speech and mild short term memory impairment but overall improving. She also mentions persistent generalized cold sensation and fatigued  quickly.  Currently ambulating with a cane, denies any recent falls.  Working with [facility] PT/OT/SLP. She is concerned that she may have injured her wrist when she fell prior to coming to ED, reports increased pain after working with OT. Currently using a wrist brace with some benefit.  Lives in own home, son currently living with her, does need assistance for ADLs and IADLs. Prior to stroke was completely independent as well as driving. She has not yet returned back to driving. Denies new stroke/TIA symptoms.  Compliant on aspirin and atorvastatin, denies side effects.  Blood pressure today 138/85.  Routinely monitors at home with home health therapy and usually overall stable. Glucose levels monitored at home which has been stable. Loop recorder has not shown atrial fibrillation thus far.  She has since been seen by PCP and endocrinology. Repeat lab work on 2/8 showed LDL 78 and A1c 7.5.  No further concerns at this time.   Stroke admission 08/01/2021 [name] is a 77 y.o. female with history of diabetes, hypertension, hyperlipidemia, obesity, and sleep apnea who presented to [facility] ED on 08/01/2021 after being found down by her son. Her last know well was approximately 07/29/2021. She may have been suffering with some upper respiratory symptoms, took some cough medicine and has not been like herself since Tuesday (1/10).  She also had not taken her diabetes medications.  She was found to be mildly hyperglycemic with blood sugars in the upper 200s.  CTH showed infarct versus mass in right MCA territory.  She was transferred to [facility] ED for further evaluation.  MRI showed extensive restricted diffusion throughout much of the right MCA territory with associated changes of hemorrhagic transformation and localized cerebral edema and mass effect. Per Dr. Pearlean Brownie, felt embolic pattern secondary to unclear source although concerning for occult A fib. Repeat [facility] 1/14 showed no interval progression of right MCA  hemorrhagic infarct with petechial hemorrhage.  CTA head/neck showed right M2 thrombus.  EF 60 to 65%.  LDL 93.  A1c 7.4.  Recommended placement of ILR prior to discharge to evaluate for A-fib.  On aspirin PTA, held during admission but restarted at discharge.  Initiated amlodipine and lisinopril for BP management.  Switched home dose simvastatin to atorvastatin 20 mg daily.  Resumed home diabetic medication regimen.  Other stroke risk factors include advanced age, obesity family history of stroke and OSA on CPAP.  Per therapy recommendations, discharged to CIR on 1/18 for ongoing therapy needs.     PERTINENT IMAGING  Per hospitalization 08/01/2021 Code Stroke- infarct versus mass in the right MCA territory. Repeat Head CT- Unchanged right MCA territory infarct with petechial hemorrhage. CTA head & neck- R M2 thrombus MRI- extensive restricted diffusion throughout much of the right MCA territory with associated changes of hemorrhagic transformation and localized cerebral edema and mass-effect.. 2D Echo ejection fraction 60 to 65%. LDL 93 HgbA1c 7.4    ROS:   14 system review of systems performed and negative with exception of those listed in HPI  PMH:  Past Medical History:  Diagnosis Date   Abnormal levels of other serum enzymes 07/17/2015   Arthritis    Constipation 07/11/2015   COVID-19 03/06/2021   Diabetes mellitus  without complication (HCC)    Elevated liver enzymes    Fatty liver    Generalized abdominal pain 07/11/2015   Hyperlipidemia    Hypertension    Lifelong obesity    Macular degeneration    Morbid obesity due to excess calories (HCC) 07/11/2015   Other fatigue 07/11/2015   Sleep apnea    Spinal headache     PSH:  Past Surgical History:  Procedure Laterality Date   ABDOMINAL HYSTERECTOMY     APPENDECTOMY     CATARACT EXTRACTION     CERVICAL LAMINECTOMY  07/21/1985   CESAREAN SECTION     COLONOSCOPY  07/21/2012   diverticulosis, [facility] Dr. [name]     COLONOSCOPY WITH PROPOFOL N/A 02/04/2019   Procedure: COLONOSCOPY WITH PROPOFOL;  Surgeon: [name], MD;  Location: [facility] ENDOSCOPY;  Service: Endoscopy;  Laterality: N/A;   COLONOSCOPY WITH PROPOFOL N/A 03/18/2021   Procedure: COLONOSCOPY WITH PROPOFOL;  Surgeon: [name], MD;  Location: [facility] ENDOSCOPY;  Service: Endoscopy;  Laterality: N/A;  DM   DIAGNOSTIC LAPAROSCOPY     ESOPHAGOGASTRODUODENOSCOPY (EGD) WITH PROPOFOL N/A 02/04/2019   Procedure: ESOPHAGOGASTRODUODENOSCOPY (EGD) WITH PROPOFOL;  Surgeon: [name], MD;  Location: [facility] ENDOSCOPY;  Service: Endoscopy;  Laterality: N/A;   ESOPHAGOGASTRODUODENOSCOPY (EGD) WITH PROPOFOL N/A 03/18/2021   Procedure: ESOPHAGOGASTRODUODENOSCOPY (EGD) WITH PROPOFOL;  Surgeon: [name], MD;  Location: [facility] ENDOSCOPY;  Service: Endoscopy;  Laterality: N/A;   LOOP RECORDER INSERTION N/A 08/05/2021   Procedure: LOOP RECORDER INSERTION;  Surgeon: [name], MD;  Location: [facility] INVASIVE CV LAB;  Service: Cardiovascular;  Laterality: N/A;   SPINE SURGERY     TUBAL LIGATION      Social History:  Social History   Socioeconomic History   Marital status: Widowed    Spouse name: Not on file   Number of children: Not on file   Years of education: Not on file   Highest education level: Bachelor's degree (e.g., BA, AB, BS)  Occupational History   Not on file  Tobacco Use   Smoking status: Never   Smokeless tobacco: Never   Tobacco comments:    Never smoke 08/08/22  Vaping Use   Vaping status: Never Used  Substance and Sexual Activity   Alcohol use: Not Currently    Comment: none last 24 months   Drug use: No   Sexual activity: Not on file  Other Topics Concern   Not on file  Social History Narrative   Not on file   Social Determinants of Health   Financial Resource Strain: Low Risk  (12/17/2022)   Received from [facility], [facility]   Overall Financial Resource  Strain (CARDIA)    Difficulty of Paying Living Expenses: Not hard at all  Food Insecurity: No Food Insecurity (12/17/2022)   Received from [facility], [facility]   Hunger Vital Sign    Worried About Running Out of Food in the Last Year: Never true    Ran Out of Food in the Last Year: Never true  Transportation Needs: No Transportation Needs (12/17/2022)   Received from [facility], [facility]   [facility] - Transportation    In the past 12 months, has lack of transportation kept you from medical appointments or from getting medications?: No    Lack of Transportation (Non-Medical): No  Physical Activity: Inactive (12/10/2017)   Exercise Vital Sign    Days of Exercise per  Week: 0 days    Minutes of Exercise per Session: 0 min  Stress: No Stress Concern Present (12/10/2017)   [employer] of Occupational Health - Occupational Stress Questionnaire    Feeling of Stress : Not at all  Social Connections: Socially Integrated (12/10/2017)   Social Connection and Isolation Panel [NHANES]    Frequency of Communication with Friends and Family: More than three times a week    Frequency of Social Gatherings with Friends and Family: More than three times a week    Attends Religious Services: More than 4 times per year    Active Member of [employer] or Organizations: Yes    Attends Engineer, structural: More than 4 times per year    Marital Status: Married  Catering manager Violence: Not At Risk (09/01/2022)   Humiliation, Afraid, Rape, and Kick questionnaire    Fear of Current or Ex-Partner: No    Emotionally Abused: No    Physically Abused: No    Sexually Abused: No    Family History:  Family History  Problem Relation Age of Onset   Cancer Mother        breast   Heart disease Mother    Stroke Mother    Diabetes Mother    Colon cancer Mother    Cancer Father        leukemia   Stroke Father    Leukemia Father     Ovarian cancer Sister    Diabetes Brother    Cancer Brother    Stroke Maternal Grandfather    Dementia Paternal Grandmother    COPD Neg Hx    Hypertension Neg Hx     Medications:   Current Outpatient Medications on File Prior to Visit  Medication Sig Dispense Refill   acetaminophen (TYLENOL) 325 MG tablet Take 2 tablets (650 mg total) by mouth every 4 (four) hours as needed for mild pain (or temp > 37.5 C (99.5 F)).     amLODipine (NORVASC) 2.5 MG tablet Take 1 tablet (2.5 mg total) by mouth daily. 30 tablet 0   apixaban (ELIQUIS) 5 MG TABS tablet Take 1 tablet (5 mg total) by mouth 2 (two) times daily. 60 tablet 6   dapagliflozin propanediol (FARXIGA) 10 MG TABS tablet Take 10 mg by mouth daily. 90 tablet 1   gabapentin (NEURONTIN) 100 MG capsule Take 1 capsule (100 mg total) by mouth at bedtime. 90 capsule 0   glipiZIDE (GLUCOTROL XL) 5 MG 24 hr tablet Take 1 tablet (5 mg total) by mouth 2 (two) times daily with a meal. (Patient taking differently: Take 2.5 mg by mouth 2 (two) times daily with a meal. 5 mg in am and 2.5 mg in the pm) 60 tablet 0   magnesium oxide (MAG-OX) 400 MG tablet Take 1 tablet (400 mg total) by mouth at bedtime. 30 tablet 0   memantine (NAMENDA) 10 MG tablet Take 1 tablet (10 mg total) by mouth 2 (two) times daily. 60 tablet 5   metFORMIN (GLUCOPHAGE) 500 MG tablet Take 2 tablets (1,000 mg total) by mouth 2 (two) times daily. 120 tablet 0   Multiple Vitamins-Minerals (PRESERVISION AREDS 2) CAPS Take 1 capsule by mouth 2 (two) times daily.     pantoprazole (PROTONIX) 40 MG tablet Take 1 tablet (40 mg total) by mouth daily. 30 tablet 0   simvastatin (ZOCOR) 40 MG tablet      Vitamin D, Ergocalciferol, (DRISDOL) 1.25 MG (50000 UNIT) CAPS capsule Take 1 capsule (50,000 Units total)  by mouth every 7 (seven) days. 5 capsule 0   Current Facility-Administered Medications on File Prior to Visit  Medication Dose Route Frequency Provider Last Rate Last Admin   capsaicin  topical system 8 % patch 4 patch  4 patch Topical Once Raulkar, Drema Pry, MD        Allergies:   Allergies  Allergen Reactions   Codeine Other (See Comments)    Makes her very loopy and groggy   Neosporin [Neomycin-Bacitracin Zn-Polymyx] Hives   Ozempic [Semaglutide] Hives   Polymyxin B Other (See Comments)    Eyes redness   Sulfa Antibiotics Itching      OBJECTIVE:  Physical Exam  Vitals:   02/26/23 1050  BP: 132/72  Pulse: 92  Weight: 182 lb (82.6 kg)  Height: 5\' 3"  (1.6 m)   Body mass index is 32.24 kg/m. No results found.  General: well developed, well nourished, pleasant elderly Caucasian female, seated, in no evident distress Head: head normocephalic and atraumatic.   Neck: supple with no carotid or supraclavicular bruits Cardiovascular: regular rate and rhythm, no murmurs Musculoskeletal: Decreased left shoulder ROM Skin:  no rash/petichiae Vascular:  Normal pulses all extremities   Neurologic Exam Mental Status: Awake and fully alert. mild dysarthria.  No evidence of aphasia. Oriented to place and time. Recent memory mildly impaired and remote memory intact. Attention span, concentration and fund of knowledge appropriate during visit. Mood and affect appropriate.     08/25/2022   11:44 AM  MMSE - Mini Mental State Exam  Orientation to time 5  Orientation to Place 5  Registration 3  Attention/ Calculation 5  Recall 2  Language- name 2 objects 2  Language- repeat 1  Language- follow 3 step command 3  Language- read & follow direction 1  Write a sentence 1  Copy design 1  Total score 29   Cranial Nerves: Pupils equal, briskly reactive to light. Extraocular movements full without nystagmus. Visual fields full to confrontation. Hearing intact. Facial sensation intact.  Left nasolabial fold flattening.  Tongue, palate moves normally and symmetrically.  Motor: Normal bulk and tone. Normal strength in all tested extremity muscles except LUE weakness,  difficulty fully testing due to left shoulder pain and limited ROM Sensory.: intact to touch , pinprick , position and vibratory sensation.  Coordination: Rapid alternating movements normal in all extremities except left hand. Finger-to-nose performed accurately RUE and heel-to-shin performed accurately bilaterally. Gait and Station: Arises from chair with mild difficulty. Stance is normal.  Wide-based gait with slightly decreased step height bilaterally and unsteadiness without use of assistive device.  Tandem walk and heel toe not attempted.  Reflexes: 1+ and symmetric. Toes downgoing.          ASSESSMENT: Alyssa Mayo is a 77 y.o. 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 Vascular risk factors include new dx of A fib, HTN, HLD, DM, advanced age, obesity and OSA.     PLAN:  Mild cognitive impairment: Suspect multifactorial in setting of sedentary lifestyle without routine memory exercises or physical activity, lack of CPAP use and hx of stroke.  Subjectively, stable since prior visit MMSE previously 29/30 - will repeat at f/u visit Continue Namenda 10 mg twice daily -refill provided Discussed importance of routine memory exercises and physical activity.  Again, advised to reach out to DME company to discuss CPAP  concerns, discussed importance of sleep apnea management and risks of untreated apnea  R MCA stroke with HT :  Residual deficit: LUE weakness, gait impairment, mild dysarthria and mild short-term memory loss.  Continue working with OT.  Recent left shoulder injury post fall, ortho concern of rotator cuff injury, waiting to be scheduled for MRI Loop recorder showed A fib 06/2022 Continue Eliquis 5mg  BID and atorvastatin 20 mg daily for secondary stroke prevention.  Please stop simvastatin at this time is no  indication to take 2 statins Continue routine f/u with cardiology for  A fib and eliqis management  Discussed secondary stroke prevention measures and importance of close PCP follow up for aggressive stroke risk factor management including BP goal<130/90, HLD with LDL goal<70 and DM with A1c.<7.  Stroke labs 11/2022: LDL 51, A1c 5.3 I have gone over the pathophysiology of stroke, warning signs and symptoms, risk factors and their management in some detail with instructions to go to the closest emergency room for symptoms of concern.    Follow up in 6 months or call earlier if needed   CC:  PCP: [name], MD    I spent 31 minutes of face-to-face and non-face-to-face time with patient and son.  This included previsit chart review, lab review, study review,  electronic health record documentation, patient and son education and discussion regarding above diagnoses and treatment plan and answered all the questions to patient and son's satisfaction   [name], [facility]  [facility] [address] [place], [place] [address]  Phone [phone] Fax [phone] Note: This document was prepared with digital dictation and possible smart phrase technology. Any transcriptional errors that result from this process are unintentional.

## 2023-02-26 ENCOUNTER — Ambulatory Visit: Payer: Medicare HMO | Admitting: Physical Therapy

## 2023-02-26 ENCOUNTER — Ambulatory Visit (INDEPENDENT_AMBULATORY_CARE_PROVIDER_SITE_OTHER): Payer: Medicare HMO | Admitting: Adult Health

## 2023-02-26 ENCOUNTER — Ambulatory Visit: Payer: Medicare HMO

## 2023-02-26 ENCOUNTER — Encounter: Payer: Self-pay | Admitting: Adult Health

## 2023-02-26 VITALS — BP 132/72 | HR 92 | Ht 63.0 in | Wt 182.0 lb

## 2023-02-26 DIAGNOSIS — I63511 Cerebral infarction due to unspecified occlusion or stenosis of right middle cerebral artery: Secondary | ICD-10-CM | POA: Diagnosis not present

## 2023-02-26 DIAGNOSIS — I69319 Unspecified symptoms and signs involving cognitive functions following cerebral infarction: Secondary | ICD-10-CM | POA: Diagnosis not present

## 2023-02-26 MED ORDER — MEMANTINE HCL 10 MG PO TABS
10.0000 mg | ORAL_TABLET | Freq: Two times a day (BID) | ORAL | 3 refills | Status: DC
Start: 2023-02-26 — End: 2024-02-09

## 2023-02-26 NOTE — Patient Instructions (Addendum)
Continue working with OT for left arm strengthening and shoulder concerns  Continue to follow with orthopedics for left shoulder and completion of MRI  Continue Namenda 10 mg twice daily for cognition/memory   Continue to follow with cardiology for atrial fibrillation and Eliquis management  Please follow up with your CPAP DME company to get restarted back on CPAP therapy for sleep apnea  Continue  Eliquis   and continue either atorvastatin for secondary stroke prevention - please stop simvastatin as no nee to take 2 statin medications   Continue to follow up with PCP regarding blood pressure and cholesterol management  Maintain strict control of hypertension with blood pressure goal below 130/90 and cholesterol with LDL cholesterol (bad cholesterol) goal below 70 mg/dL.   Signs of a Stroke? Follow the BEFAST method:  Balance Watch for a sudden loss of balance, trouble with coordination or vertigo Eyes Is there a sudden loss of vision in one or both eyes? Or double vision?  Face: Ask the person to smile. Does one side of the face droop or is it numb?  Arms: Ask the person to raise both arms. Does one arm drift downward? Is there weakness or numbness of a leg? Speech: Ask the person to repeat a simple phrase. Does the speech sound slurred/strange? Is the person confused ? Time: If you observe any of these signs, call 911.     Followup in the future with me in 6 months or call earlier if needed       Thank you for coming to see [place] at [facility]. I hope we have been able to provide you high quality care today.  You may receive a patient satisfaction survey over the next few weeks. We would appreciate your feedback and comments so that we may continue to improve ourselves and the health of our patients.

## 2023-03-03 ENCOUNTER — Ambulatory Visit: Payer: Medicare HMO | Admitting: Occupational Therapy

## 2023-03-03 ENCOUNTER — Ambulatory Visit: Payer: Medicare HMO | Admitting: Physical Therapy

## 2023-03-05 ENCOUNTER — Ambulatory Visit: Payer: Medicare HMO | Admitting: Physical Therapy

## 2023-03-05 ENCOUNTER — Ambulatory Visit: Payer: Medicare HMO | Admitting: Occupational Therapy

## 2023-03-05 DIAGNOSIS — M6281 Muscle weakness (generalized): Secondary | ICD-10-CM

## 2023-03-05 DIAGNOSIS — R278 Other lack of coordination: Secondary | ICD-10-CM

## 2023-03-05 NOTE — Therapy (Addendum)
Occupational Therapy Neuro Treatment Note     Patient Name: Chatney Gregory MRN: [id number] DOB:1946/01/28, 77 y.o., female Today's Date: 03/14/2022   PCP: [name], MD REFERRING PROVIDER: [name], NP     OT End of Session - 03/05/23 1326     Visit Number 39    Number of Visits 48    Date for OT Re-Evaluation 04/07/23    Authorization Type Progress reporting period starting 01/13/2023    OT Start Time 1015    OT Stop Time 1100    OT Time Calculation (min) 45 min    Activity Tolerance Patient tolerated treatment well    Behavior During Therapy [facility] for tasks assessed/performed                                           Past Medical History:  Diagnosis Date   Abnormal levels of other serum enzymes 07/17/2015   Arthritis     Constipation 07/11/2015   COVID-19 03/06/2021   Diabetes mellitus without complication (HCC)     Elevated liver enzymes     Fatty liver     Generalized abdominal pain 07/11/2015   Hyperlipidemia     Hypertension     Lifelong obesity     Macular degeneration     Morbid obesity due to excess calories (HCC) 07/11/2015   Other fatigue 07/11/2015   Sleep apnea     Spinal headache           Past Surgical History:  Procedure Laterality Date   ABDOMINAL HYSTERECTOMY       APPENDECTOMY       CATARACT EXTRACTION       CERVICAL LAMINECTOMY   07/21/1985   CESAREAN SECTION       COLONOSCOPY   07/21/2012    diverticulosis, [facility] Dr. [name]    COLONOSCOPY WITH PROPOFOL N/A 02/04/2019    Procedure: COLONOSCOPY WITH PROPOFOL;  Surgeon: [name], MD;  Location: [facility] ENDOSCOPY;  Service: Endoscopy;  Laterality: N/A;   COLONOSCOPY WITH PROPOFOL N/A 03/18/2021    Procedure: COLONOSCOPY WITH PROPOFOL;  Surgeon: [name], MD;  Location: [facility] ENDOSCOPY;  Service: Endoscopy;  Laterality: N/A;  DM   DIAGNOSTIC LAPAROSCOPY       ESOPHAGOGASTRODUODENOSCOPY (EGD) WITH PROPOFOL N/A 02/04/2019    Procedure:  ESOPHAGOGASTRODUODENOSCOPY (EGD) WITH PROPOFOL;  Surgeon: [name], MD;  Location: [facility] ENDOSCOPY;  Service: Endoscopy;  Laterality: N/A;   ESOPHAGOGASTRODUODENOSCOPY (EGD) WITH PROPOFOL N/A 03/18/2021    Procedure: ESOPHAGOGASTRODUODENOSCOPY (EGD) WITH PROPOFOL;  Surgeon: [name], MD;  Location: [facility] ENDOSCOPY;  Service: Endoscopy;  Laterality: N/A;   LOOP RECORDER INSERTION N/A 08/05/2021    Procedure: LOOP RECORDER INSERTION;  Surgeon: [name], MD;  Location: [facility] INVASIVE CV LAB;  Service: Cardiovascular;  Laterality: N/A;   SPINE SURGERY       TUBAL LIGATION            Patient Active Problem List    Diagnosis Date Noted   Paroxysmal atrial fibrillation (HCC) 08/08/2022   Hypercoagulable state due to paroxysmal atrial fibrillation (HCC) 08/08/2022   Cerebral edema (HCC) 08/07/2021   OSA (obstructive sleep apnea) 08/07/2021   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   CVA (cerebral vascular accident) (HCC) 08/01/2021   GERD (gastroesophageal reflux disease) 07/20/2019   Advanced care planning/counseling discussion 12/17/2016   Skin lesions, generalized  11/13/2016   Chronic right hip pain 06/17/2016   Vitamin D deficiency 09/26/2015   Diastasis recti 08/08/2015   Elevated serum GGT level 07/24/2015   Essential hypertension 07/17/2015   Fatty liver 07/17/2015   Elevated alkaline phosphatase level 07/17/2015   Elevated serum glutamic pyruvic transaminase (SGPT) level 07/17/2015   Abnormal finding on EKG 07/11/2015   Morbid obesity (HCC) 07/11/2015   Colon, diverticulosis 07/11/2015   Abdominal wall hernia 07/11/2015   DM (diabetes mellitus), type 2 with neurological complications (HCC)     Hyperlipidemia      ONSET DATE: 08/01/2021   REFERRING DIAG: CVA   THERAPY DIAG:  Muscle weakness (generalized)   Rationale for Evaluation and Treatment Rehabilitation   SUBJECTIVE:    SUBJECTIVE STATEMENT:   Pt. reports  that her son is going to  the beach this weekend, and she is looking forward to having the house to herself.   Pt accompanied by: self   PERTINENT HISTORY:  Pt. is a 77 y.o. female who had a unexpectedly hospitalized from 2/12-2/14/2024 for COVID-19, and a UTI.  Pt. 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 Pt. attended inpatient rehab from 08/07/2021-08/22/2021. Pt. received Home health therapy services this past spring.  Pt. has recently had an assessment through driver rehabilitation services at [facility] with recommendations for referrals for  outpatient OT/PT, and ST services. Pt. PMHx includes: HTN, Hyperlipidemia, Macular degeneration, DM, and Obesity.   PRECAUTIONS: None   WEIGHT BEARING RESTRICTIONS No   PAIN:  Are you having pain?  4/10 pain in LUE   FALLS: Has patient fallen in last 6 months? Yes.   LIVING ENVIRONMENT: Lives with: lives with their family  Son  [name] Lives in: House/apartment Main living are on one floor Stairs:  2 steps to enter Has following equipment at home: Single point cane, Wheelchair (manual), shower chair, Grab bars, bed rail, and rubber mat.   PLOF: Independent   PATIENT GOALS: To be able to Drive, and cook again   OBJECTIVE:    HAND DOMINANCE: Right    ADLs:  Transfers/ambulation related to ADLs: Independent Eating: Independent Grooming: [employer] Pt. reports being unable to use her left hand to perform haircare.  UB Dressing: Independent pullover shirt, independent now with fastening a bra LB Dressing: Independent donning pants socks, and slide on shoes Toileting: Independent Bathing: Independent Tub Shower transfers: Walk-in shower Independent now    IADLs: Shopping: Needs to be accompanied to the grocery store. Light housekeeping: Do  Meal Prep:  Is able to perform light meal prep Community mobility: Relies on son, and friend Medication management: Son sets up Mining engineer management:TBD Handwriting:  TBD   MOBILITY STATUS: Hx of falls out of bed     ACTIVITY TOLERANCE: Activity tolerance:  10-20 min. Before rest break   FUNCTIONAL OUTCOME MEASURES: FOTO: 61  TR score: 62  10/09/2022: FOTO: 49   UPPER EXTREMITY ROM      Active ROM Right eval Left eval Left  10/09/2022 Left 10/23/22 Left 12/02/2022 Left 01/13/2023  Shoulder flexion WFL 54(74) scaption 0(55) 26(80) 32(82) 35(82)  Shoulder abduction WFL 58(75) 32(54) 32(50) 45(60) 47(70)  Shoulder adduction          Shoulder extension          Shoulder internal rotation          Shoulder external rotation          Elbow flexion [facility] WFL 145 145 145 145  Elbow  extension [facility] WFL 10(0) 10(0) WFL WFL  Wrist flexion WFL TBD [facility] WFL  Wrist extension WFL TBD [facility] WFL  Wrist ulnar deviation          Wrist radial deviation          Wrist pronation          Wrist supination          (Blank rows = not tested)     UPPER EXTREMITY MMT:      MMT Right eval Left eval Left 10/09/2022 Left 10/23/2022 Left 12/02/2022 Left 01/13/2023  Shoulder flexion 4+/5 2+/5 1/5 2-/5 2-/5 2-/5  Shoulder abduction 4+/5 2+/5 2-/5 2-/5 2-/5 2-/5  Shoulder adduction          Shoulder extension          Shoulder internal rotation          Shoulder external rotation          Middle trapezius          Lower trapezius          Elbow flexion 4+/5 3+/5 3/5 3/5 3+/5 3+/5  Elbow extension 4+/5 3+/5 3/5 3/5 3+/5 3+/5  Wrist flexion 4+/5 TBD 3/5 3/5 3+/5 3+/5  Wrist extension 4+/5 TBD 3/5 3/5 3+/5 3+/5  Wrist ulnar deviation          Wrist radial deviation          Wrist pronation          Wrist supination          (Blank rows = not tested)   HAND FUNCTION: Grip strength: Right: 33 lbs; Left: 11 lbs, Lateral pinch: Right: 17 lbs, Left: 2 lbs, and 3 point pinch: Right: 17 lbs, Left: 2 lbs  10/09/2022: Grip strength: Right: 33 lbs; Left: 10 lbs, Lateral pinch: Right: 17 lbs, Left: 1 lbs, and 3 point pinch: Right: 17 lbs, Left: 1 lbs    10/23/2022: Grip strength: Right: 33 lbs; Left: 16 lbs, Lateral pinch: Right: 17 lbs, Left: 1 lbs, and 3 point pinch: Right: 17 lbs, Left: 1 lbs  12/02/2022: Grip strength: Right: 33 lbs; Left: 17 lbs, Lateral pinch: Right: 17 lbs, Left: 7 lbs, and 3 point pinch: Right: 17 lbs, Left: 3 lbs   01/13/2023: Grip strength: Right: 33 lbs; Left: 15 lbs, Lateral pinch: Right: 17 lbs, Left: 9 lbs, and 3 point pinch: Right: 17 lbs, Left: 7 lbs   COORDINATION:   9 Hole Peg test: Right: 27 sec.  sec; Left: 52 sec.  10/09/2022: 9 Hole Peg test: Right: 27 sec.  sec; Left: 1 min. & 35 sec.  01/13/2023: 9 Hole Peg test: Right: 27 sec.  sec; Left: 51. Sec.  SENSATION: Light touch: WFL Proprioception: WFL   COGNITION: Overall cognitive status: Within functional limits for tasks assessed   VISION: Subjective report: Glasses. Just received a new prescription to increase bifocal strength Baseline vision: Macular Degeneration Visual history: macular degeneration   VISION ASSESSMENT: To be further assessed in functional context  Left sided Awareness      PERCEPTION: Limited left sided awareness     TODAY'S TREATMENT:   Therapeutic Exercise:   Pt. tolerated PROM in all joint ranges of the left shoulder. Shoulder flexion and abduction at the tabletop surface. Pt. worked on [employer], and reciprocal motion using the UBE while seated for 8 min. With minimal resistance. Pt. performed left shoulder flexion, and abduction at the tabletop surface.  Neuromuscular re-education:    Pt. performed [facility] tasks using the Grooved pegboard. Pt. worked on grasping the grooved pegs from a horizontal position, and moving the pegs to a vertical position in the hand to prepare for placing them in the grooved slot.  Pt. Worked on grasping, and storing the grooved pegs, then, worked on performing translatory movements moving them through her hand to discard them from the tip of her 2nd digit,a nd thumb. Pt. Worked on  controlling dropping them one at a time  from the ulnar aspect of her hand.      Manual Therapy:   Pt. tolerated scapular mobilizations in elevation, depression, abduction/rotation 2/2 tightness. Manual therapy was performed independent of, and in preparation for ROM.    PATIENT EDUCATION: Education details: BUE strengthening and flexibility exercises Person educated: Patient Education method: verbal cues Education comprehension: verbalized and demonstrated understanding; further training needed.    HOME EXERCISE PROGRAM:  Reviewed home activities to enhance left hand digit extension  during typing skills.   GOALS: Goals reviewed with patient? Yes     SHORT TERM GOALS: Target date: 02/24/2023     Pt. Will improve FOTO score by 2 points to reflect improved pt. perceived functional performance Baseline: 01/13/2023: 49 12/01/2022: FOTO: 51 10/23/2022: FOTO 49 10/09/2022: FOTO 49, FOTO: 61 TR score: 62  Goal status: INITIAL   LONG TERM GOALS: Target date:04/07/2023  Pt. Will improve left shoulder ROM by 10 degrees to be able able to efficiently apply deodorant Baseline: 01/13/2023:  Shoulder flexion: 35(82), Abduction: 47(70) 12/02/2022: Shoulder flexion: 32(82), Abduction: 45(60)  10/23/2022: Shoulder flexion: 26(80), Abduction: 32(50) 10/09/2022: flexion: 0(55), Abduction: 32(54) Goal status: Improving, but very limited.   2.  Pt. will be able to independently hold and use a blow dryer, and brush for hair care. Baseline: 01/13/2023: Pt. Can hold the hair dryer in the left hand, however is unable to reach up to dry it, and has difficulty using a brush. 12/02/2022: Pt. is unable to actively elevate he left shoulder in order to blow dry her hair. 10/23/2022: Pt. continues to be unable to hold a blow dryer. Eval: Pt. Is unable to sustain her BUEs in elevation, and use a blow dryer, and brush. 10/09/2022: Pt. Is unable to hold a blow dryer. Eval: Pt. Is unable to sustain her BUEs in elevation, and use a  blow dryer, and brush. Goal status: Ongoing   3.  Pt. Will improve left lateral pinch strength by 3# to be able to independently cut meat. Baseline: 01/13/2023: Left lateral pinch strength: 9#  12/02/2022: Left pinch: 7# Pt. has difficulty cutting meat. 10/23/2022: Pt. Continues to present with difficulty cutting meat. 10/09/2022: 1#  Eval: 2#. Pt. has difficulty stabilizing utensil, and food while cutting food. Goal status: Ongoing   4.  Pt. Will improve Left hand [facility] skills to be able to be able to independently, and efficiently manipulate small objects during ADL tasks. Baseline 01/13/2023: 51 sec. 12/02/2022: TBD  10/23/2022: Pt. Continues to present with difficulty manipulating small objects. 10/09/2022: Left: 1 min & 35 sec.: Eval: Left: 52 sec. Right: 27 sec. Goal status: Ongoing     6.: Pt. will improve with left grip strength to be able to hold and pull up covers. Baseline: 01/13/2023: left  grip strength: 15# 12/02/2022: 17# 10/23/2022: Left: 16# 10/09/2022: Left: 10# Eval:  Left grip strength 11#. Pt. has difficulty holding a full drink  securely with the left hand.  Goal status: Ongoing   7. Pt. will improve typing speed, and accuracy in preparation for efficiently typing simple email message. Baseline: 01/13/2023: TBD 12/02/2022:  Pt. continues to present with limited speed, and accuracy with typing.10/23/2022: Pt. continues to present with limited speed, and accuracy with typing. 10/09/2022: Pt. presents with difficulty typing. Pt. presents with difficulty typing an email. Typing speed, and accuracy TBD             Goal status: INITIAL                                        CLINICAL IMPRESSION:  Pt. Reports having had a vertigo episode on Tuesday, and was unable to attend therapy. Pt. reports feeling better today, however occasionally stills has vertigo when turning her head a certain way. Pt. Tolerated the exercises, and FMC tasks well today.  Pt. continues to present with  difficulty using the left hand during hair care, applying deodorant, cutting meat, pulling up covers, and typing an email message. Pt. continues to benefit from OT services to work towards improving Left UE functioning in order to maximize engagement in, and overall independence with ADLs, and IADL tasks.    PERFORMANCE DEFICITS in functional skills including ADLs, IADLs, coordination, dexterity, sensation, ROM, strength, FMC, decreased knowledge of use of DME, vision, and UE functional use, cognitive skills including attention, and psychosocial skills including coping strategies, environmental adaptation, habits, and routines and behaviors.    IMPAIRMENTS are limiting patient from ADLs, IADLs, and social participation.    COMORBIDITIES may have co-morbidities  that affects occupational performance. Patient will benefit from skilled OT to address above impairments and improve overall function.   MODIFICATION OR ASSISTANCE TO COMPLETE EVALUATION: Min-Moderate modification of tasks or assist with assess necessary to complete an evaluation.   OT OCCUPATIONAL PROFILE AND HISTORY: Detailed assessment: Review of records and additional review of physical, cognitive, psychosocial history related to current functional performance.   CLINICAL DECISION MAKING: Moderate - several treatment options, min-mod task modification necessary   REHAB POTENTIAL: Good   EVALUATION COMPLEXITY: Moderate      PLAN: OT FREQUENCY: 2x/week   OT DURATION: 12 weeks   PLANNED INTERVENTIONS: self care/ADL training, therapeutic exercise, therapeutic activity, neuromuscular re-education, manual therapy, passive range of motion, and paraffin   RECOMMENDED OTHER SERVICES: ST, and PT   CONSULTED AND AGREED WITH PLAN OF CARE: Patient   PLAN FOR NEXT SESSION:  Left hand strengthening and coordination exercises   Olegario Messier, MS, OTR/L  03/05/2023

## 2023-03-09 ENCOUNTER — Ambulatory Visit (INDEPENDENT_AMBULATORY_CARE_PROVIDER_SITE_OTHER): Payer: Medicare HMO

## 2023-03-09 DIAGNOSIS — I639 Cerebral infarction, unspecified: Secondary | ICD-10-CM | POA: Diagnosis not present

## 2023-03-09 LAB — CUP PACEART REMOTE DEVICE CHECK
Date Time Interrogation Session: 20240818231358
Implantable Pulse Generator Implant Date: 20230116

## 2023-03-10 ENCOUNTER — Ambulatory Visit: Payer: Medicare HMO | Admitting: Physical Therapy

## 2023-03-10 ENCOUNTER — Ambulatory Visit: Payer: Medicare HMO | Admitting: Occupational Therapy

## 2023-03-10 DIAGNOSIS — R278 Other lack of coordination: Secondary | ICD-10-CM

## 2023-03-10 DIAGNOSIS — M6281 Muscle weakness (generalized): Secondary | ICD-10-CM

## 2023-03-10 NOTE — Therapy (Signed)
Occupational Therapy Progress/Recertification Note  Dates of reporting period  01/13/2023   to   03/10/2023      Patient Name: Taleeya Hamlyn MRN: [id number] DOB:1945/11/09, 77 y.o., female Today's Date: 03/14/2022   PCP: [name], MD REFERRING PROVIDER: [name], NP     OT End of Session - 03/10/23 1023     Visit Number 40    Number of Visits 72    Date for OT Re-Evaluation 06/02/23    OT Start Time 1020    OT Stop Time 1100    OT Time Calculation (min) 40 min    Activity Tolerance Patient tolerated treatment well    Behavior During Therapy [facility] for tasks assessed/performed                                           Past Medical History:  Diagnosis Date   Abnormal levels of other serum enzymes 07/17/2015   Arthritis     Constipation 07/11/2015   COVID-19 03/06/2021   Diabetes mellitus without complication (HCC)     Elevated liver enzymes     Fatty liver     Generalized abdominal pain 07/11/2015   Hyperlipidemia     Hypertension     Lifelong obesity     Macular degeneration     Morbid obesity due to excess calories (HCC) 07/11/2015   Other fatigue 07/11/2015   Sleep apnea     Spinal headache           Past Surgical History:  Procedure Laterality Date   ABDOMINAL HYSTERECTOMY       APPENDECTOMY       CATARACT EXTRACTION       CERVICAL LAMINECTOMY   07/21/1985   CESAREAN SECTION       COLONOSCOPY   07/21/2012    diverticulosis, [facility] Dr. [name]    COLONOSCOPY WITH PROPOFOL N/A 02/04/2019    Procedure: COLONOSCOPY WITH PROPOFOL;  Surgeon: [name], MD;  Location: [facility] ENDOSCOPY;  Service: Endoscopy;  Laterality: N/A;   COLONOSCOPY WITH PROPOFOL N/A 03/18/2021    Procedure: COLONOSCOPY WITH PROPOFOL;  Surgeon: [name], MD;  Location: [facility] ENDOSCOPY;  Service: Endoscopy;  Laterality: N/A;  DM   DIAGNOSTIC LAPAROSCOPY       ESOPHAGOGASTRODUODENOSCOPY (EGD) WITH PROPOFOL N/A 02/04/2019    Procedure:  ESOPHAGOGASTRODUODENOSCOPY (EGD) WITH PROPOFOL;  Surgeon: [name], MD;  Location: [facility] ENDOSCOPY;  Service: Endoscopy;  Laterality: N/A;   ESOPHAGOGASTRODUODENOSCOPY (EGD) WITH PROPOFOL N/A 03/18/2021    Procedure: ESOPHAGOGASTRODUODENOSCOPY (EGD) WITH PROPOFOL;  Surgeon: [name], MD;  Location: [facility] ENDOSCOPY;  Service: Endoscopy;  Laterality: N/A;   LOOP RECORDER INSERTION N/A 08/05/2021    Procedure: LOOP RECORDER INSERTION;  Surgeon: [name], MD;  Location: [facility] INVASIVE CV LAB;  Service: Cardiovascular;  Laterality: N/A;   SPINE SURGERY       TUBAL LIGATION            Patient Active Problem List    Diagnosis Date Noted   Paroxysmal atrial fibrillation (HCC) 08/08/2022   Hypercoagulable state due to paroxysmal atrial fibrillation (HCC) 08/08/2022   Cerebral edema (HCC) 08/07/2021   OSA (obstructive sleep apnea) 08/07/2021   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   CVA (cerebral vascular accident) (HCC) 08/01/2021   GERD (gastroesophageal reflux disease) 07/20/2019   Advanced care planning/counseling discussion 12/17/2016  Skin lesions, generalized 11/13/2016   Chronic right hip pain 06/17/2016   Vitamin D deficiency 09/26/2015   Diastasis recti 08/08/2015   Elevated serum GGT level 07/24/2015   Essential hypertension 07/17/2015   Fatty liver 07/17/2015   Elevated alkaline phosphatase level 07/17/2015   Elevated serum glutamic pyruvic transaminase (SGPT) level 07/17/2015   Abnormal finding on EKG 07/11/2015   Morbid obesity (HCC) 07/11/2015   Colon, diverticulosis 07/11/2015   Abdominal wall hernia 07/11/2015   DM (diabetes mellitus), type 2 with neurological complications (HCC)     Hyperlipidemia      ONSET DATE: 08/01/2021   REFERRING DIAG: CVA   THERAPY DIAG:  Muscle weakness (generalized)   Rationale for Evaluation and Treatment Rehabilitation   SUBJECTIVE:    SUBJECTIVE STATEMENT:    Pt. reports that  that she did great  at home alone while her son went to the beach this last weekend.   Pt accompanied by: self   PERTINENT HISTORY:  Pt. is a 77 y.o. female who had a unexpectedly hospitalized from 2/12-2/14/2024 for COVID-19, and a UTI.  Pt. 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 Pt. attended inpatient rehab from 08/07/2021-08/22/2021. Pt. received Home health therapy services this past spring.  Pt. has recently had an assessment through driver rehabilitation services at [facility] with recommendations for referrals for  outpatient OT/PT, and ST services. Pt. PMHx includes: HTN, Hyperlipidemia, Macular degeneration, DM, and Obesity.   PRECAUTIONS: None   WEIGHT BEARING RESTRICTIONS No   PAIN:  Are you having pain?  4/10 pain in LUE   FALLS: Has patient fallen in last 6 months? Yes.   LIVING ENVIRONMENT: Lives with: lives with their family  Son  [name] Lives in: House/apartment Main living are on one floor Stairs:  2 steps to enter Has following equipment at home: Single point cane, Wheelchair (manual), shower chair, Grab bars, bed rail, and rubber mat.   PLOF: Independent   PATIENT GOALS: To be able to Drive, and cook again   OBJECTIVE:    HAND DOMINANCE: Right    ADLs:  Transfers/ambulation related to ADLs: Independent Eating: Independent Grooming: [employer] Pt. reports being unable to use her left hand to perform haircare.  UB Dressing: Independent pullover shirt, independent now with fastening a bra LB Dressing: Independent donning pants socks, and slide on shoes Toileting: Independent Bathing: Independent Tub Shower transfers: Walk-in shower Independent now    IADLs: Shopping: Needs to be accompanied to the grocery store. Light housekeeping: Do  Meal Prep:  Is able to perform light meal prep Community mobility: Relies on son, and friend Medication management: Son sets up Mining engineer management:TBD Handwriting: TBD   MOBILITY  STATUS: Hx of falls out of bed     ACTIVITY TOLERANCE: Activity tolerance:  10-20 min. Before rest break   FUNCTIONAL OUTCOME MEASURES: FOTO: 61  TR score: 62  10/09/2022: FOTO: 49   UPPER EXTREMITY ROM      Active ROM Right eval Left eval Left  10/09/2022 Left 10/23/22 Left 12/02/2022 Left 01/13/2023 Left  03/10/2023  Shoulder flexion WFL 54(74) scaption 0(55) 26(80) 32(82) 35(82) 35(85)  Shoulder abduction WFL 58(75) 32(54) 32(50) 45(60) 47(70) 62(72)  Shoulder adduction           Shoulder extension           Shoulder internal rotation           Shoulder external rotation  Elbow flexion [facility] WFL 145 145 145 145 145  Elbow extension [facility] WFL 10(0) 10(0) WFL WFL WFL  Wrist flexion WFL TBD [facility] WFL  Wrist extension WFL TBD [facility] WFL  Wrist ulnar deviation           Wrist radial deviation           Wrist pronation           Wrist supination           (Blank rows = not tested)     UPPER EXTREMITY MMT:      MMT Right eval Left eval Left 10/09/2022 Left 10/23/2022 Left 12/02/2022 Left 01/13/2023 Left 03/10/2023  Shoulder flexion 4+/5 2+/5 1/5 2-/5 2-/5 2-/5 2-/5  Shoulder abduction 4+/5 2+/5 2-/5 2-/5 2-/5 2-/5 2-/5  Shoulder adduction           Shoulder extension           Shoulder internal rotation           Shoulder external rotation           Middle trapezius           Lower trapezius           Elbow flexion 4+/5 3+/5 3/5 3/5 3+/5 3+/5 4/5  Elbow extension 4+/5 3+/5 3/5 3/5 3+/5 3+/5 4/5  Wrist flexion 4+/5 TBD 3/5 3/5 3+/5 3+/5 4/5  Wrist extension 4+/5 TBD 3/5 3/5 3+/5 3+/5 4/5  Wrist ulnar deviation           Wrist radial deviation           Wrist pronation           Wrist supination           (Blank rows = not tested)   HAND FUNCTION: Grip strength: Right: 33 lbs; Left: 11 lbs, Lateral pinch: Right: 17 lbs, Left: 2 lbs, and 3 point pinch: Right: 17 lbs, Left: 2 lbs  10/09/2022: Grip strength: Right: 33 lbs; Left: 10 lbs, Lateral  pinch: Right: 17 lbs, Left: 1 lbs, and 3 point pinch: Right: 17 lbs, Left: 1 lbs   10/23/2022: Grip strength: Right: 33 lbs; Left: 16 lbs, Lateral pinch: Right: 17 lbs, Left: 1 lbs, and 3 point pinch: Right: 17 lbs, Left: 1 lbs  12/02/2022: Grip strength: Right: 33 lbs; Left: 17 lbs, Lateral pinch: Right: 17 lbs, Left: 7 lbs, and 3 point pinch: Right: 17 lbs, Left: 3 lbs   01/13/2023: Grip strength: Right: 33 lbs; Left: 15 lbs, Lateral pinch: Right: 17 lbs, Left: 9 lbs, and 3 point pinch: Right: 17 lbs, Left: 7 lbs  03/10/2023: Grip strength: Right: 33 lbs; Left: 15 lbs, Lateral pinch: Right: 17 lbs, Left: 9 lbs, and 3 point pinch: Right: 17 lbs, Left: 7 lbs     COORDINATION:   9 Hole Peg test: Right: 27 sec.  sec; Left: 52 sec.  10/09/2022: 9 Hole Peg test: Right: 27 sec.  sec; Left: 1 min. & 35 sec.  01/13/2023: 9 Hole Peg test: Right: 27 sec.  sec; Left: 51. Sec.  03/10/2023: 9 Hole Peg test: Right: 27 sec.  sec; Left: 54 Sec.   SENSATION: Light touch: WFL Proprioception: WFL   COGNITION: Overall cognitive status: Within functional limits for tasks assessed   VISION: Subjective report: Glasses. Just received a new prescription to increase bifocal strength Baseline vision: Macular Degeneration Visual history: macular degeneration   VISION ASSESSMENT: To be further assessed  in functional context  Left sided Awareness      PERCEPTION: Limited left sided awareness     TODAY'S TREATMENT:   Therapeutic Exercise:   Pt. tolerated PROM in all joint ranges of the left shoulder. Shoulder flexion and abduction at the tabletop surface.     Measurements were obtained, and goals were reviewed with the Pt.    PATIENT EDUCATION: Education details: BUE strengthening and flexibility exercises Person educated: Patient Education method: verbal cues Education comprehension: verbalized and demonstrated understanding; further training needed.    HOME EXERCISE PROGRAM:  Reviewed home  activities to enhance left hand digit extension  during typing skills.   GOALS: Goals reviewed with patient? Yes     SHORT TERM GOALS: Target date: 04/21/2023    Pt. Will improve FOTO score by 2 points to reflect improved pt. perceived functional performance Baseline: 03/10/2023: 59 01/13/2023: 49 12/01/2022: FOTO: 51 10/23/2022: FOTO 49 10/09/2022: FOTO 49, FOTO: 61 TR score: 62  Goal status: INITIAL   LONG TERM GOALS: Target date: 06/02/2023    Pt. Will improve left shoulder ROM by 10 degrees to be able able to efficiently apply deodorant Baseline: 03/10/2023: Shoulder flexion: 35(85) 01/13/2023:  Shoulder flexion: 35(82), Abduction: 47(70) 12/02/2022: Shoulder flexion: 32(82), Abduction: 45(60)  10/23/2022: Shoulder flexion: 26(80), Abduction: 32(50) 10/09/2022: flexion: 0(55), Abduction: 32(54) Goal status: Improving, but very limited.   2.  Pt. will be able to independently hold and use a blow dryer, and brush for hair care. Baseline: 03/10/2023: Pt. continues to be able to hold the hair dryer in the left hand, however is unable to reach up to dry it, and has difficulty using a brush 01/13/2023: Pt. Can hold the hair dryer in the left hand, however is unable to reach up to dry it, and has difficulty using a brush. 12/02/2022: Pt. is unable to actively elevate he left shoulder in order to blow dry her hair. 10/23/2022: Pt. continues to be unable to hold a blow dryer. Eval: Pt. Is unable to sustain her BUEs in elevation, and use a blow dryer, and brush. 10/09/2022: Pt. Is unable to hold a blow dryer. Eval: Pt. Is unable to sustain her BUEs in elevation, and use a blow dryer, and brush. Goal status: Ongoing   3.  Pt. Will improve left lateral pinch strength by 3# to be able to independently cut meat. Baseline: 03/10/2023: Left lateral pinch strength: 9#   01/13/2023: Left lateral pinch strength: 9#  12/02/2022: Left pinch: 7# Pt. has difficulty cutting meat. 10/23/2022: Pt. Continues to present with  difficulty cutting meat. 10/09/2022: 1#  Eval: 2#. Pt. has difficulty stabilizing utensil, and food while cutting food. Goal status: Ongoing   4.  Pt. Will improve Left hand [facility] skills to be able to be able to independently, and efficiently manipulate small objects during ADL tasks. Baseline 03/10/2023: 54 sec. 01/13/2023: 51 sec. 12/02/2022: TBD  10/23/2022: Pt. Continues to present with difficulty manipulating small objects. 10/09/2022: Left: 1 min & 35 sec.: Eval: Left: 52 sec. Right: 27 sec. Goal status: Ongoing     6.: Pt. will improve with left grip strength to be able to hold and pull up covers. Baseline: 03/10/2023: left  grip strength: 15# 01/13/2023: left  grip strength: 15# 12/02/2022: 17# 10/23/2022: Left: 16# 10/09/2022: Left: 10# Eval:  Left grip strength 11#. Pt. has difficulty holding a full drink  securely with the left hand.             Goal status: Ongoing  7. Pt. will improve typing speed, and accuracy in preparation for efficiently typing simple email message. Baseline: 03/10/2023: Pt. continues to present with limited speed, and accuracy with typing 01/13/2023: TBD 12/02/2022:  Pt. continues to present with limited speed, and accuracy with typing.10/23/2022: Pt. continues to present with limited speed, and accuracy with typing. 10/09/2022: Pt. presents with difficulty typing. Pt. presents with difficulty typing an email. Typing speed, and accuracy TBD             Goal status: INITIAL                                        CLINICAL IMPRESSION:  Pt. tolerated the exercises, and FMC tasks well today. Pt. reports feeling better, and reports that it went well for her this weekend staying at home alone while her son went to the beach. Pt. has made made progress with left shoulder ROM. Pt. continues to present with very limited left shoulder ROM, and continues to present with 1/10 pain. Pt. Reports that she has finally been approved for the MRI of the shoulder. Pt. continues to present with  difficulty using the left hand during hair care, applying deodorant, cutting meat, pulling up covers, and typing an email message. Pt. continues to benefit from OT services to work towards improving Left UE functioning in order to maximize engagement in, and overall independence with ADLs, and IADL tasks.    PERFORMANCE DEFICITS in functional skills including ADLs, IADLs, coordination, dexterity, sensation, ROM, strength, FMC, decreased knowledge of use of DME, vision, and UE functional use, cognitive skills including attention, and psychosocial skills including coping strategies, environmental adaptation, habits, and routines and behaviors.    IMPAIRMENTS are limiting patient from ADLs, IADLs, and social participation.    COMORBIDITIES may have co-morbidities  that affects occupational performance. Patient will benefit from skilled OT to address above impairments and improve overall function.   MODIFICATION OR ASSISTANCE TO COMPLETE EVALUATION: Min-Moderate modification of tasks or assist with assess necessary to complete an evaluation.   OT OCCUPATIONAL PROFILE AND HISTORY: Detailed assessment: Review of records and additional review of physical, cognitive, psychosocial history related to current functional performance.   CLINICAL DECISION MAKING: Moderate - several treatment options, min-mod task modification necessary   REHAB POTENTIAL: Good   EVALUATION COMPLEXITY: Moderate      PLAN: OT FREQUENCY: 2x/week   OT DURATION: 12 weeks   PLANNED INTERVENTIONS: self care/ADL training, therapeutic exercise, therapeutic activity, neuromuscular re-education, manual therapy, passive range of motion, and paraffin   RECOMMENDED OTHER SERVICES: ST, and PT   CONSULTED AND AGREED WITH PLAN OF CARE: Patient   PLAN FOR NEXT SESSION:  Left hand strengthening and coordination exercises   Olegario Messier, MS, OTR/L  03/10/2023

## 2023-03-12 ENCOUNTER — Ambulatory Visit: Payer: Medicare HMO | Admitting: Occupational Therapy

## 2023-03-12 ENCOUNTER — Ambulatory Visit: Payer: Medicare HMO | Admitting: Physical Therapy

## 2023-03-12 DIAGNOSIS — M6281 Muscle weakness (generalized): Secondary | ICD-10-CM

## 2023-03-12 NOTE — Therapy (Signed)
Occupational Therapy Neuro Treatment Note   Patient Name: Alyssa Mayo MRN: [id number] DOB:[birth year], 77 y.o., female Today's Date: 03/14/2022   PCP: [name], MD REFERRING PROVIDER: [name], NP     OT End of Session - 03/12/23 1024     Visit Number 41    Number of Visits 72    Date for OT Re-Evaluation 06/02/23    OT Start Time 1020    OT Stop Time 1100    OT Time Calculation (min) 40 min    Activity Tolerance Patient tolerated treatment well    Behavior During Therapy [facility] for tasks assessed/performed                                           Past Medical History:  Diagnosis Date   Abnormal levels of other serum enzymes 07/17/2015   Arthritis     Constipation 07/11/2015   COVID-19 03/06/2021   Diabetes mellitus without complication (HCC)     Elevated liver enzymes     Fatty liver     Generalized abdominal pain 07/11/2015   Hyperlipidemia     Hypertension     Lifelong obesity     Macular degeneration     Morbid obesity due to excess calories (HCC) 07/11/2015   Other fatigue 07/11/2015   Sleep apnea     Spinal headache           Past Surgical History:  Procedure Laterality Date   ABDOMINAL HYSTERECTOMY       APPENDECTOMY       CATARACT EXTRACTION       CERVICAL LAMINECTOMY   07/21/1985   CESAREAN SECTION       COLONOSCOPY   07/21/2012    diverticulosis, [facility] Dr. [name]    COLONOSCOPY WITH PROPOFOL N/A 02/04/2019    Procedure: COLONOSCOPY WITH PROPOFOL;  Surgeon: [name], MD;  Location: [facility] ENDOSCOPY;  Service: Endoscopy;  Laterality: N/A;   COLONOSCOPY WITH PROPOFOL N/A 03/18/2021    Procedure: COLONOSCOPY WITH PROPOFOL;  Surgeon: [name], MD;  Location: [facility] ENDOSCOPY;  Service: Endoscopy;  Laterality: N/A;  DM   DIAGNOSTIC LAPAROSCOPY       ESOPHAGOGASTRODUODENOSCOPY (EGD) WITH PROPOFOL N/A 02/04/2019    Procedure: ESOPHAGOGASTRODUODENOSCOPY (EGD) WITH PROPOFOL;  Surgeon: [name], MD;   Location: [facility] ENDOSCOPY;  Service: Endoscopy;  Laterality: N/A;   ESOPHAGOGASTRODUODENOSCOPY (EGD) WITH PROPOFOL N/A 03/18/2021    Procedure: ESOPHAGOGASTRODUODENOSCOPY (EGD) WITH PROPOFOL;  Surgeon: [name], MD;  Location: [facility] ENDOSCOPY;  Service: Endoscopy;  Laterality: N/A;   LOOP RECORDER INSERTION N/A 08/05/2021    Procedure: LOOP RECORDER INSERTION;  Surgeon: [name], MD;  Location: [facility] INVASIVE CV LAB;  Service: Cardiovascular;  Laterality: N/A;   SPINE SURGERY       TUBAL LIGATION            Patient Active Problem List    Diagnosis Date Noted   Paroxysmal atrial fibrillation (HCC) 08/08/2022   Hypercoagulable state due to paroxysmal atrial fibrillation (HCC) 08/08/2022   Cerebral edema (HCC) 08/07/2021   OSA (obstructive sleep apnea) 08/07/2021   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   CVA (cerebral vascular accident) (HCC) 08/01/2021   GERD (gastroesophageal reflux disease) 07/20/2019   Advanced care planning/counseling discussion 12/17/2016   Skin lesions, generalized 11/13/2016   Chronic right hip pain 06/17/2016   Vitamin D  deficiency 09/26/2015   Diastasis recti 08/08/2015   Elevated serum GGT level 07/24/2015   Essential hypertension 07/17/2015   Fatty liver 07/17/2015   Elevated alkaline phosphatase level 07/17/2015   Elevated serum glutamic pyruvic transaminase (SGPT) level 07/17/2015   Abnormal finding on EKG 07/11/2015   Morbid obesity (HCC) 07/11/2015   Colon, diverticulosis 07/11/2015   Abdominal wall hernia 07/11/2015   DM (diabetes mellitus), type 2 with neurological complications (HCC)     Hyperlipidemia      ONSET DATE: 08/01/2021   REFERRING DIAG: CVA   THERAPY DIAG:  Muscle weakness (generalized)   Rationale for Evaluation and Treatment Rehabilitation   SUBJECTIVE:    SUBJECTIVE STATEMENT:    Pt. Reports left shoulder stiffness today.   Pt accompanied by: self   PERTINENT HISTORY:  Pt. is a 77 y.o. female  who had a unexpectedly hospitalized from 2/12-2/14/2024 for COVID-19, and a UTI.  Pt. 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 Pt. attended inpatient rehab from 08/07/2021-08/22/2021. Pt. received Home health therapy services this past spring.  Pt. has recently had an assessment through driver rehabilitation services at [facility] with recommendations for referrals for  outpatient OT/PT, and ST services. Pt. PMHx includes: HTN, Hyperlipidemia, Macular degeneration, DM, and Obesity.   PRECAUTIONS: None   WEIGHT BEARING RESTRICTIONS No   PAIN:  Are you having pain?  8/10 to 6-7/10 stiffness pain in LUE   FALLS: Has patient fallen in last 6 months? Yes.   LIVING ENVIRONMENT: Lives with: lives with their family  Son  [name] Lives in: House/apartment Main living are on one floor Stairs:  2 steps to enter Has following equipment at home: Single point cane, Wheelchair (manual), shower chair, Grab bars, bed rail, and rubber mat.   PLOF: Independent   PATIENT GOALS: To be able to Drive, and cook again   OBJECTIVE:    HAND DOMINANCE: Right    ADLs:  Transfers/ambulation related to ADLs: Independent Eating: Independent Grooming: [employer] Pt. reports being unable to use her left hand to perform haircare.  UB Dressing: Independent pullover shirt, independent now with fastening a bra LB Dressing: Independent donning pants socks, and slide on shoes Toileting: Independent Bathing: Independent Tub Shower transfers: Walk-in shower Independent now    IADLs: Shopping: Needs to be accompanied to the grocery store. Light housekeeping: Do  Meal Prep:  Is able to perform light meal prep Community mobility: Relies on son, and friend Medication management: Son sets up Mining engineer management:TBD Handwriting: TBD   MOBILITY STATUS: Hx of falls out of bed     ACTIVITY TOLERANCE: Activity tolerance:  10-20 min. Before rest break   FUNCTIONAL  OUTCOME MEASURES: FOTO: 61  TR score: 62  10/09/2022: FOTO: 49   UPPER EXTREMITY ROM      Active ROM Right eval Left eval Left  10/09/2022 Left 10/23/22 Left 12/02/2022 Left 01/13/2023 Left  03/10/2023  Shoulder flexion WFL 54(74) scaption 0(55) 26(80) 32(82) 35(82) 35(85)  Shoulder abduction WFL 58(75) 32(54) 32(50) 45(60) 47(70) 62(72)  Shoulder adduction           Shoulder extension           Shoulder internal rotation           Shoulder external rotation           Elbow flexion [facility] WFL 145 145 145 145 145  Elbow extension [facility] WFL 10(0) 10(0) WFL WFL WFL  Wrist flexion WFL TBD [facility] WFL  Wrist extension WFL TBD [facility] WFL  Wrist ulnar deviation           Wrist radial deviation           Wrist pronation           Wrist supination           (Blank rows = not tested)     UPPER EXTREMITY MMT:      MMT Right eval Left eval Left 10/09/2022 Left 10/23/2022 Left 12/02/2022 Left 01/13/2023 Left 03/10/2023  Shoulder flexion 4+/5 2+/5 1/5 2-/5 2-/5 2-/5 2-/5  Shoulder abduction 4+/5 2+/5 2-/5 2-/5 2-/5 2-/5 2-/5  Shoulder adduction           Shoulder extension           Shoulder internal rotation           Shoulder external rotation           Middle trapezius           Lower trapezius           Elbow flexion 4+/5 3+/5 3/5 3/5 3+/5 3+/5 4/5  Elbow extension 4+/5 3+/5 3/5 3/5 3+/5 3+/5 4/5  Wrist flexion 4+/5 TBD 3/5 3/5 3+/5 3+/5 4/5  Wrist extension 4+/5 TBD 3/5 3/5 3+/5 3+/5 4/5  Wrist ulnar deviation           Wrist radial deviation           Wrist pronation           Wrist supination           (Blank rows = not tested)   HAND FUNCTION: Grip strength: Right: 33 lbs; Left: 11 lbs, Lateral pinch: Right: 17 lbs, Left: 2 lbs, and 3 point pinch: Right: 17 lbs, Left: 2 lbs  10/09/2022: Grip strength: Right: 33 lbs; Left: 10 lbs, Lateral pinch: Right: 17 lbs, Left: 1 lbs, and 3 point pinch: Right: 17 lbs, Left: 1 lbs   10/23/2022: Grip strength: Right: 33 lbs;  Left: 16 lbs, Lateral pinch: Right: 17 lbs, Left: 1 lbs, and 3 point pinch: Right: 17 lbs, Left: 1 lbs  12/02/2022: Grip strength: Right: 33 lbs; Left: 17 lbs, Lateral pinch: Right: 17 lbs, Left: 7 lbs, and 3 point pinch: Right: 17 lbs, Left: 3 lbs   01/13/2023: Grip strength: Right: 33 lbs; Left: 15 lbs, Lateral pinch: Right: 17 lbs, Left: 9 lbs, and 3 point pinch: Right: 17 lbs, Left: 7 lbs  03/10/2023: Grip strength: Right: 33 lbs; Left: 15 lbs, Lateral pinch: Right: 17 lbs, Left: 9 lbs, and 3 point pinch: Right: 17 lbs, Left: 7 lbs     COORDINATION:   9 Hole Peg test: Right: 27 sec.  sec; Left: 52 sec.  10/09/2022: 9 Hole Peg test: Right: 27 sec.  sec; Left: 1 min. & 35 sec.  01/13/2023: 9 Hole Peg test: Right: 27 sec.  sec; Left: 51. Sec.  03/10/2023: 9 Hole Peg test: Right: 27 sec.  sec; Left: 54 Sec.   SENSATION: Light touch: WFL Proprioception: WFL   COGNITION: Overall cognitive status: Within functional limits for tasks assessed   VISION: Subjective report: Glasses. Just received a new prescription to increase bifocal strength Baseline vision: Macular Degeneration Visual history: macular degeneration   VISION ASSESSMENT: To be further assessed in functional context  Left sided Awareness      PERCEPTION: Limited left sided awareness     TODAY'S TREATMENT:   Therapeutic Exercise:  Pt. tolerated PROM in all joint ranges of the left shoulder. Shoulder flexion and abduction at the tabletop surface. Pt. worked on [employer], and reciprocal motion using the UBE while seated for 8 min. with minimal resistance. Pt. performed left shoulder flexion, and abduction at the tabletop surface following moist heat to the left shoulder 2/2 pain, and stiffness.   Neuromuscular re-education:    Pt. performed [facility] tasks using the Grooved pegboard. Pt. worked on grasping the grooved pegs from a horizontal position, and moving the pegs to a vertical position in the hand to prepare  for placing them in the grooved slot.  Pt. Worked on grasping, and storing the grooved pegs, then, worked on performing translatory movements moving them through her hand to discard them from the tip of her 2nd digit, and thumb. Pt. Worked on controlling dropping them one at a time  from the ulnar aspect of her hand.      Manual Therapy:   Pt. tolerated scapular mobilizations in elevation, depression, abduction/rotation 2/2 tightness. Manual therapy was performed independent of, and in preparation for ROM.       PATIENT EDUCATION: Education details: BUE strengthening and flexibility exercises Person educated: Patient Education method: verbal cues Education comprehension: verbalized and demonstrated understanding; further training needed.    HOME EXERCISE PROGRAM:  Reviewed home activities to enhance left hand digit extension during typing skills.   GOALS: Goals reviewed with patient? Yes     SHORT TERM GOALS: Target date: 04/21/2023    Pt. Will improve FOTO score by 2 points to reflect improved pt. perceived functional performance Baseline: 03/10/2023: 59 01/13/2023: 49 12/01/2022: FOTO: 51 10/23/2022: FOTO 49 10/09/2022: FOTO 49, FOTO: 61 TR score: 62  Goal status: INITIAL   LONG TERM GOALS: Target date: 06/02/2023    Pt. Will improve left shoulder ROM by 10 degrees to be able able to efficiently apply deodorant Baseline: 03/10/2023: Shoulder flexion: 35(85) 01/13/2023:  Shoulder flexion: 35(82), Abduction: 47(70) 12/02/2022: Shoulder flexion: 32(82), Abduction: 45(60)  10/23/2022: Shoulder flexion: 26(80), Abduction: 32(50) 10/09/2022: flexion: 0(55), Abduction: 32(54) Goal status: Improving, but very limited.   2.  Pt. will be able to independently hold and use a blow dryer, and brush for hair care. Baseline: 03/10/2023: Pt. continues to be able to hold the hair dryer in the left hand, however is unable to reach up to dry it, and has difficulty using a brush 01/13/2023: Pt. Can hold the hair  dryer in the left hand, however is unable to reach up to dry it, and has difficulty using a brush. 12/02/2022: Pt. is unable to actively elevate he left shoulder in order to blow dry her hair. 10/23/2022: Pt. continues to be unable to hold a blow dryer. Eval: Pt. Is unable to sustain her BUEs in elevation, and use a blow dryer, and brush. 10/09/2022: Pt. Is unable to hold a blow dryer. Eval: Pt. Is unable to sustain her BUEs in elevation, and use a blow dryer, and brush. Goal status: Ongoing   3.  Pt. Will improve left lateral pinch strength by 3# to be able to independently cut meat. Baseline: 03/10/2023: Left lateral pinch strength: 9#   01/13/2023: Left lateral pinch strength: 9#  12/02/2022: Left pinch: 7# Pt. has difficulty cutting meat. 10/23/2022: Pt. Continues to present with difficulty cutting meat. 10/09/2022: 1#  Eval: 2#. Pt. has difficulty stabilizing utensil, and food while cutting food. Goal status: Ongoing   4.  Pt. Will improve Left hand [facility] skills to be able  to be able to independently, and efficiently manipulate small objects during ADL tasks. Baseline 03/10/2023: 54 sec. 01/13/2023: 51 sec. 12/02/2022: TBD  10/23/2022: Pt. Continues to present with difficulty manipulating small objects. 10/09/2022: Left: 1 min & 35 sec.: Eval: Left: 52 sec. Right: 27 sec. Goal status: Ongoing     6.: Pt. will improve with left grip strength to be able to hold and pull up covers. Baseline: 03/10/2023: left  grip strength: 15# 01/13/2023: left  grip strength: 15# 12/02/2022: 17# 10/23/2022: Left: 16# 10/09/2022: Left: 10# Eval:  Left grip strength 11#. Pt. has difficulty holding a full drink  securely with the left hand.             Goal status: Ongoing   7. Pt. will improve typing speed, and accuracy in preparation for efficiently typing simple email message. Baseline: 03/10/2023: Pt. continues to present with limited speed, and accuracy with typing 01/13/2023: TBD 12/02/2022:  Pt. continues to present with limited  speed, and accuracy with typing.10/23/2022: Pt. continues to present with limited speed, and accuracy with typing. 10/09/2022: Pt. presents with difficulty typing. Pt. presents with difficulty typing an email. Typing speed, and accuracy TBD             Goal status: INITIAL                                        CLINICAL IMPRESSION:  Pt. reports that her Orthopedic PA's office resubmitted an order for an MRI of the left shoulder. Upon arrival, Pt. reports 8/10 stiffness pain in the left shoulder. Pt. tolerated the exercises, and FMC tasks well today with pain decreasing from 8/10 to 6-7/10.  Pt. continues to present with difficulty using the left hand during hair care, applying deodorant, cutting meat, pulling up covers, and typing an email message. Pt. continues to benefit from OT services to work towards improving Left UE functioning in order to maximize engagement in, and overall independence with ADLs, and IADL tasks.     PERFORMANCE DEFICITS in functional skills including ADLs, IADLs, coordination, dexterity, sensation, ROM, strength, FMC, decreased knowledge of use of DME, vision, and UE functional use, cognitive skills including attention, and psychosocial skills including coping strategies, environmental adaptation, habits, and routines and behaviors.    IMPAIRMENTS are limiting patient from ADLs, IADLs, and social participation.    COMORBIDITIES may have co-morbidities  that affects occupational performance. Patient will benefit from skilled OT to address above impairments and improve overall function.   MODIFICATION OR ASSISTANCE TO COMPLETE EVALUATION: Min-Moderate modification of tasks or assist with assess necessary to complete an evaluation.   OT OCCUPATIONAL PROFILE AND HISTORY: Detailed assessment: Review of records and additional review of physical, cognitive, psychosocial history related to current functional performance.   CLINICAL DECISION MAKING: Moderate - several treatment  options, min-mod task modification necessary   REHAB POTENTIAL: Good   EVALUATION COMPLEXITY: Moderate      PLAN: OT FREQUENCY: 2x/week   OT DURATION: 12 weeks   PLANNED INTERVENTIONS: self care/ADL training, therapeutic exercise, therapeutic activity, neuromuscular re-education, manual therapy, passive range of motion, and paraffin   RECOMMENDED OTHER SERVICES: ST, and PT   CONSULTED AND AGREED WITH PLAN OF CARE: Patient   PLAN FOR NEXT SESSION:  Left hand strengthening and coordination exercises   Olegario Messier, MS, OTR/L  03/12/2023

## 2023-03-13 ENCOUNTER — Encounter: Payer: Medicare HMO | Admitting: Physical Medicine and Rehabilitation

## 2023-03-17 ENCOUNTER — Ambulatory Visit: Payer: Medicare HMO | Admitting: Physical Therapy

## 2023-03-17 ENCOUNTER — Ambulatory Visit: Payer: Medicare HMO | Admitting: Occupational Therapy

## 2023-03-17 DIAGNOSIS — M6281 Muscle weakness (generalized): Secondary | ICD-10-CM | POA: Diagnosis not present

## 2023-03-17 DIAGNOSIS — R278 Other lack of coordination: Secondary | ICD-10-CM

## 2023-03-17 NOTE — Therapy (Signed)
Occupational Therapy Neuro Treatment Note   Patient Name: Alyssa Mayo MRN: [id number] DOB:December 21, 1945, 77 y.o., female Today's Date: 03/14/2022   PCP: [name], MD REFERRING PROVIDER: [name], NP     OT End of Session - 03/17/23 1051     Visit Number 42    Number of Visits 72    Date for OT Re-Evaluation 06/02/23    Authorization Type Progress reporting period starting 01/13/2023    OT Start Time 1015    OT Stop Time 1100    OT Time Calculation (min) 45 min    Activity Tolerance Patient tolerated treatment well    Behavior During Therapy [facility] for tasks assessed/performed                                           Past Medical History:  Diagnosis Date   Abnormal levels of other serum enzymes 07/17/2015   Arthritis     Constipation 07/11/2015   COVID-19 03/06/2021   Diabetes mellitus without complication (HCC)     Elevated liver enzymes     Fatty liver     Generalized abdominal pain 07/11/2015   Hyperlipidemia     Hypertension     Lifelong obesity     Macular degeneration     Morbid obesity due to excess calories (HCC) 07/11/2015   Other fatigue 07/11/2015   Sleep apnea     Spinal headache           Past Surgical History:  Procedure Laterality Date   ABDOMINAL HYSTERECTOMY       APPENDECTOMY       CATARACT EXTRACTION       CERVICAL LAMINECTOMY   07/21/1985   CESAREAN SECTION       COLONOSCOPY   07/21/2012    diverticulosis, [facility] Dr. [name]    COLONOSCOPY WITH PROPOFOL N/A 02/04/2019    Procedure: COLONOSCOPY WITH PROPOFOL;  Surgeon: [name], MD;  Location: [facility] ENDOSCOPY;  Service: Endoscopy;  Laterality: N/A;   COLONOSCOPY WITH PROPOFOL N/A 03/18/2021    Procedure: COLONOSCOPY WITH PROPOFOL;  Surgeon: [name], MD;  Location: [facility] ENDOSCOPY;  Service: Endoscopy;  Laterality: N/A;  DM   DIAGNOSTIC LAPAROSCOPY       ESOPHAGOGASTRODUODENOSCOPY (EGD) WITH PROPOFOL N/A 02/04/2019    Procedure:  ESOPHAGOGASTRODUODENOSCOPY (EGD) WITH PROPOFOL;  Surgeon: [name], MD;  Location: [facility] ENDOSCOPY;  Service: Endoscopy;  Laterality: N/A;   ESOPHAGOGASTRODUODENOSCOPY (EGD) WITH PROPOFOL N/A 03/18/2021    Procedure: ESOPHAGOGASTRODUODENOSCOPY (EGD) WITH PROPOFOL;  Surgeon: [name], MD;  Location: [facility] ENDOSCOPY;  Service: Endoscopy;  Laterality: N/A;   LOOP RECORDER INSERTION N/A 08/05/2021    Procedure: LOOP RECORDER INSERTION;  Surgeon: [name], MD;  Location: [facility] INVASIVE CV LAB;  Service: Cardiovascular;  Laterality: N/A;   SPINE SURGERY       TUBAL LIGATION            Patient Active Problem List    Diagnosis Date Noted   Paroxysmal atrial fibrillation (HCC) 08/08/2022   Hypercoagulable state due to paroxysmal atrial fibrillation (HCC) 08/08/2022   Cerebral edema (HCC) 08/07/2021   OSA (obstructive sleep apnea) 08/07/2021   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   CVA (cerebral vascular accident) (HCC) 08/01/2021   GERD (gastroesophageal reflux disease) 07/20/2019   Advanced care planning/counseling discussion 12/17/2016   Skin lesions, generalized 11/13/2016  Chronic right hip pain 06/17/2016   Vitamin D deficiency 09/26/2015   Diastasis recti 08/08/2015   Elevated serum GGT level 07/24/2015   Essential hypertension 07/17/2015   Fatty liver 07/17/2015   Elevated alkaline phosphatase level 07/17/2015   Elevated serum glutamic pyruvic transaminase (SGPT) level 07/17/2015   Abnormal finding on EKG 07/11/2015   Morbid obesity (HCC) 07/11/2015   Colon, diverticulosis 07/11/2015   Abdominal wall hernia 07/11/2015   DM (diabetes mellitus), type 2 with neurological complications (HCC)     Hyperlipidemia      ONSET DATE: 08/01/2021   REFERRING DIAG: CVA   THERAPY DIAG:  Muscle weakness (generalized)   Rationale for Evaluation and Treatment Rehabilitation   SUBJECTIVE:    SUBJECTIVE STATEMENT:    Pt. Reports left shoulder stiffness  today.   Pt accompanied by: self   PERTINENT HISTORY:  Pt. is a 77 y.o. female who had a unexpectedly hospitalized from 2/12-2/14/2024 for COVID-19, and a UTI.  Pt. 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 Pt. attended inpatient rehab from 08/07/2021-08/22/2021. Pt. received Home health therapy services this past spring.  Pt. has recently had an assessment through driver rehabilitation services at [facility] with recommendations for referrals for  outpatient OT/PT, and ST services. Pt. PMHx includes: HTN, Hyperlipidemia, Macular degeneration, DM, and Obesity.   PRECAUTIONS: None   WEIGHT BEARING RESTRICTIONS No   PAIN:  Are you having pain?  1/10    FALLS: Has patient fallen in last 6 months? Yes.   LIVING ENVIRONMENT: Lives with: lives with their family  Son  [name] Lives in: House/apartment Main living are on one floor Stairs:  2 steps to enter Has following equipment at home: Single point cane, Wheelchair (manual), shower chair, Grab bars, bed rail, and rubber mat.   PLOF: Independent   PATIENT GOALS: To be able to Drive, and cook again   OBJECTIVE:    HAND DOMINANCE: Right    ADLs:  Transfers/ambulation related to ADLs: Independent Eating: Independent Grooming: [employer] Pt. reports being unable to use her left hand to perform haircare.  UB Dressing: Independent pullover shirt, independent now with fastening a bra LB Dressing: Independent donning pants socks, and slide on shoes Toileting: Independent Bathing: Independent Tub Shower transfers: Walk-in shower Independent now    IADLs: Shopping: Needs to be accompanied to the grocery store. Light housekeeping: Do  Meal Prep:  Is able to perform light meal prep Community mobility: Relies on son, and friend Medication management: Son sets up Mining engineer management:TBD Handwriting: TBD   MOBILITY STATUS: Hx of falls out of bed     ACTIVITY TOLERANCE: Activity  tolerance:  10-20 min. Before rest break   FUNCTIONAL OUTCOME MEASURES: FOTO: 61  TR score: 62  10/09/2022: FOTO: 49   UPPER EXTREMITY ROM      Active ROM Right eval Left eval Left  10/09/2022 Left 10/23/22 Left 12/02/2022 Left 01/13/2023 Left  03/10/2023  Shoulder flexion WFL 54(74) scaption 0(55) 26(80) 32(82) 35(82) 35(85)  Shoulder abduction WFL 58(75) 32(54) 32(50) 45(60) 47(70) 62(72)  Shoulder adduction           Shoulder extension           Shoulder internal rotation           Shoulder external rotation           Elbow flexion [facility] WFL 145 145 145 145 145  Elbow extension [facility] WFL 10(0) 10(0) WFL WFL WFL  Wrist flexion [facility] 352  TBD [facility] WFL  Wrist extension WFL TBD [facility] WFL  Wrist ulnar deviation           Wrist radial deviation           Wrist pronation           Wrist supination           (Blank rows = not tested)     UPPER EXTREMITY MMT:      MMT Right eval Left eval Left 10/09/2022 Left 10/23/2022 Left 12/02/2022 Left 01/13/2023 Left 03/10/2023  Shoulder flexion 4+/5 2+/5 1/5 2-/5 2-/5 2-/5 2-/5  Shoulder abduction 4+/5 2+/5 2-/5 2-/5 2-/5 2-/5 2-/5  Shoulder adduction           Shoulder extension           Shoulder internal rotation           Shoulder external rotation           Middle trapezius           Lower trapezius           Elbow flexion 4+/5 3+/5 3/5 3/5 3+/5 3+/5 4/5  Elbow extension 4+/5 3+/5 3/5 3/5 3+/5 3+/5 4/5  Wrist flexion 4+/5 TBD 3/5 3/5 3+/5 3+/5 4/5  Wrist extension 4+/5 TBD 3/5 3/5 3+/5 3+/5 4/5  Wrist ulnar deviation           Wrist radial deviation           Wrist pronation           Wrist supination           (Blank rows = not tested)   HAND FUNCTION: Grip strength: Right: 33 lbs; Left: 11 lbs, Lateral pinch: Right: 17 lbs, Left: 2 lbs, and 3 point pinch: Right: 17 lbs, Left: 2 lbs  10/09/2022: Grip strength: Right: 33 lbs; Left: 10 lbs, Lateral pinch: Right: 17 lbs, Left: 1 lbs, and 3 point pinch: Right: 17 lbs,  Left: 1 lbs   10/23/2022: Grip strength: Right: 33 lbs; Left: 16 lbs, Lateral pinch: Right: 17 lbs, Left: 1 lbs, and 3 point pinch: Right: 17 lbs, Left: 1 lbs  12/02/2022: Grip strength: Right: 33 lbs; Left: 17 lbs, Lateral pinch: Right: 17 lbs, Left: 7 lbs, and 3 point pinch: Right: 17 lbs, Left: 3 lbs   01/13/2023: Grip strength: Right: 33 lbs; Left: 15 lbs, Lateral pinch: Right: 17 lbs, Left: 9 lbs, and 3 point pinch: Right: 17 lbs, Left: 7 lbs  03/10/2023: Grip strength: Right: 33 lbs; Left: 15 lbs, Lateral pinch: Right: 17 lbs, Left: 9 lbs, and 3 point pinch: Right: 17 lbs, Left: 7 lbs     COORDINATION:   9 Hole Peg test: Right: 27 sec.  sec; Left: 52 sec.  10/09/2022: 9 Hole Peg test: Right: 27 sec.  sec; Left: 1 min. & 35 sec.  01/13/2023: 9 Hole Peg test: Right: 27 sec.  sec; Left: 51. Sec.  03/10/2023: 9 Hole Peg test: Right: 27 sec.  sec; Left: 54 Sec.   SENSATION: Light touch: WFL Proprioception: WFL   COGNITION: Overall cognitive status: Within functional limits for tasks assessed   VISION: Subjective report: Glasses. Just received a new prescription to increase bifocal strength Baseline vision: Macular Degeneration Visual history: macular degeneration   VISION ASSESSMENT: To be further assessed in functional context  Left sided Awareness      PERCEPTION: Limited left sided awareness     TODAY'S TREATMENT:  Therapeutic Exercise:   Pt. tolerated PROM in all joint ranges of the left shoulder following moist heat. Shoulder flexion and abduction at the tabletop surface. Pt. worked on [employer], and reciprocal motion using the UBE while seated for 8 min. with minimal resistance. Pt. performed left shoulder flexion, and abduction at the tabletop surface following moist heat to the left shoulder 2/2 pain, and stiffness.   Neuromuscular re-education:    Pt. worked on [facility] skills using the [employer] Task. Pt. worked on sustaining grasp on the  resistive tweezers while grasping this sticks, and moving them from a horizontal position to a vertical position to prepare for placing them into the pegboard. Pt. required verbal cues, and cues for visual demonstration for wrist position, and hand pattern when placing them into the pegboard.    Manual Therapy:   Pt. tolerated scapular mobilizations in elevation, depression, abduction/rotation 2/2 tightness. Manual therapy was performed independent of, and in preparation for ROM.       PATIENT EDUCATION: Education details: BUE strengthening and flexibility exercises Person educated: Patient Education method: verbal cues Education comprehension: verbalized and demonstrated understanding; further training needed.    HOME EXERCISE PROGRAM:  Reviewed home activities to enhance left hand digit extension during typing skills.   GOALS: Goals reviewed with patient? Yes     SHORT TERM GOALS: Target date: 04/21/2023    Pt. Will improve FOTO score by 2 points to reflect improved pt. perceived functional performance Baseline: 03/10/2023: 59 01/13/2023: 49 12/01/2022: FOTO: 51 10/23/2022: FOTO 49 10/09/2022: FOTO 49, FOTO: 61 TR score: 62  Goal status: INITIAL   LONG TERM GOALS: Target date: 06/02/2023    Pt. Will improve left shoulder ROM by 10 degrees to be able able to efficiently apply deodorant Baseline: 03/10/2023: Shoulder flexion: 35(85) 01/13/2023:  Shoulder flexion: 35(82), Abduction: 47(70) 12/02/2022: Shoulder flexion: 32(82), Abduction: 45(60)  10/23/2022: Shoulder flexion: 26(80), Abduction: 32(50) 10/09/2022: flexion: 0(55), Abduction: 32(54) Goal status: Improving, but very limited.   2.  Pt. will be able to independently hold and use a blow dryer, and brush for hair care. Baseline: 03/10/2023: Pt. continues to be able to hold the hair dryer in the left hand, however is unable to reach up to dry it, and has difficulty using a brush 01/13/2023: Pt. Can hold the hair dryer in the left hand,  however is unable to reach up to dry it, and has difficulty using a brush. 12/02/2022: Pt. is unable to actively elevate he left shoulder in order to blow dry her hair. 10/23/2022: Pt. continues to be unable to hold a blow dryer. Eval: Pt. Is unable to sustain her BUEs in elevation, and use a blow dryer, and brush. 10/09/2022: Pt. Is unable to hold a blow dryer. Eval: Pt. Is unable to sustain her BUEs in elevation, and use a blow dryer, and brush. Goal status: Ongoing   3.  Pt. Will improve left lateral pinch strength by 3# to be able to independently cut meat. Baseline: 03/10/2023: Left lateral pinch strength: 9#   01/13/2023: Left lateral pinch strength: 9#  12/02/2022: Left pinch: 7# Pt. has difficulty cutting meat. 10/23/2022: Pt. Continues to present with difficulty cutting meat. 10/09/2022: 1#  Eval: 2#. Pt. has difficulty stabilizing utensil, and food while cutting food. Goal status: Ongoing   4.  Pt. Will improve Left hand [facility] skills to be able to be able to independently, and efficiently manipulate small objects during ADL tasks. Baseline 03/10/2023: 54 sec. 01/13/2023: 51 sec. 12/02/2022:  TBD  10/23/2022: Pt. Continues to present with difficulty manipulating small objects. 10/09/2022: Left: 1 min & 35 sec.: Eval: Left: 52 sec. Right: 27 sec. Goal status: Ongoing     6.: Pt. will improve with left grip strength to be able to hold and pull up covers. Baseline: 03/10/2023: left  grip strength: 15# 01/13/2023: left  grip strength: 15# 12/02/2022: 17# 10/23/2022: Left: 16# 10/09/2022: Left: 10# Eval:  Left grip strength 11#. Pt. has difficulty holding a full drink  securely with the left hand.             Goal status: Ongoing   7. Pt. will improve typing speed, and accuracy in preparation for efficiently typing simple email message. Baseline: 03/10/2023: Pt. continues to present with limited speed, and accuracy with typing 01/13/2023: TBD 12/02/2022:  Pt. continues to present with limited speed, and accuracy with  typing.10/23/2022: Pt. continues to present with limited speed, and accuracy with typing. 10/09/2022: Pt. presents with difficulty typing. Pt. presents with difficulty typing an email. Typing speed, and accuracy TBD             Goal status: INITIAL                                        CLINICAL IMPRESSION:  Pt. reports 1/10 stiffness pain in the left shoulder. Pt. tolerated the exercises, and FMC tasks well with decreased stiffness following treatment. Pt. continues to present with significantly decreased left shoulder ROM. Pt. continues to present with difficulty using the left hand during hair care, applying deodorant, cutting meat, pulling up covers, and typing an email message. Pt. continues to benefit from OT services to work towards improving Left UE functioning in order to maximize engagement in, and overall independence with ADLs, and IADL tasks.     PERFORMANCE DEFICITS in functional skills including ADLs, IADLs, coordination, dexterity, sensation, ROM, strength, FMC, decreased knowledge of use of DME, vision, and UE functional use, cognitive skills including attention, and psychosocial skills including coping strategies, environmental adaptation, habits, and routines and behaviors.    IMPAIRMENTS are limiting patient from ADLs, IADLs, and social participation.    COMORBIDITIES may have co-morbidities  that affects occupational performance. Patient will benefit from skilled OT to address above impairments and improve overall function.   MODIFICATION OR ASSISTANCE TO COMPLETE EVALUATION: Min-Moderate modification of tasks or assist with assess necessary to complete an evaluation.   OT OCCUPATIONAL PROFILE AND HISTORY: Detailed assessment: Review of records and additional review of physical, cognitive, psychosocial history related to current functional performance.   CLINICAL DECISION MAKING: Moderate - several treatment options, min-mod task modification necessary   REHAB POTENTIAL:  Good   EVALUATION COMPLEXITY: Moderate      PLAN: OT FREQUENCY: 2x/week   OT DURATION: 12 weeks   PLANNED INTERVENTIONS: self care/ADL training, therapeutic exercise, therapeutic activity, neuromuscular re-education, manual therapy, passive range of motion, and paraffin   RECOMMENDED OTHER SERVICES: ST, and PT   CONSULTED AND AGREED WITH PLAN OF CARE: Patient   PLAN FOR NEXT SESSION:  Left hand strengthening and coordination exercises   Olegario Messier, MS, OTR/L  03/12/2023

## 2023-03-19 ENCOUNTER — Ambulatory Visit: Payer: Medicare HMO | Admitting: Physical Therapy

## 2023-03-19 ENCOUNTER — Ambulatory Visit: Payer: Medicare HMO | Admitting: Occupational Therapy

## 2023-03-19 DIAGNOSIS — M6281 Muscle weakness (generalized): Secondary | ICD-10-CM

## 2023-03-19 NOTE — Progress Notes (Signed)
Carelink Summary Report / Loop Recorder 

## 2023-03-19 NOTE — Therapy (Addendum)
Occupational Therapy Neuro Treatment Note   Patient Name: Alyssa Mayo MRN: [id number] DOB:January 25, 1946, 77 y.o., female Today's Date: 03/14/2022   PCP: [name], MD REFERRING PROVIDER: [name], NP     OT End of Session - 03/19/23 1054     Visit Number 43    Number of Visits 72    Date for OT Re-Evaluation 06/02/23    OT Start Time 1020    OT Stop Time 1100    OT Time Calculation (min) 40 min    Activity Tolerance Patient tolerated treatment well    Behavior During Therapy [facility] for tasks assessed/performed                                           Past Medical History:  Diagnosis Date   Abnormal levels of other serum enzymes 07/17/2015   Arthritis     Constipation 07/11/2015   COVID-19 03/06/2021   Diabetes mellitus without complication (HCC)     Elevated liver enzymes     Fatty liver     Generalized abdominal pain 07/11/2015   Hyperlipidemia     Hypertension     Lifelong obesity     Macular degeneration     Morbid obesity due to excess calories (HCC) 07/11/2015   Other fatigue 07/11/2015   Sleep apnea     Spinal headache           Past Surgical History:  Procedure Laterality Date   ABDOMINAL HYSTERECTOMY       APPENDECTOMY       CATARACT EXTRACTION       CERVICAL LAMINECTOMY   07/21/1985   CESAREAN SECTION       COLONOSCOPY   07/21/2012    diverticulosis, [facility] Dr. [name]    COLONOSCOPY WITH PROPOFOL N/A 02/04/2019    Procedure: COLONOSCOPY WITH PROPOFOL;  Surgeon: [name], MD;  Location: [facility] ENDOSCOPY;  Service: Endoscopy;  Laterality: N/A;   COLONOSCOPY WITH PROPOFOL N/A 03/18/2021    Procedure: COLONOSCOPY WITH PROPOFOL;  Surgeon: [name], MD;  Location: [facility] ENDOSCOPY;  Service: Endoscopy;  Laterality: N/A;  DM   DIAGNOSTIC LAPAROSCOPY       ESOPHAGOGASTRODUODENOSCOPY (EGD) WITH PROPOFOL N/A 02/04/2019    Procedure: ESOPHAGOGASTRODUODENOSCOPY (EGD) WITH PROPOFOL;  Surgeon: [name], MD;   Location: [facility] ENDOSCOPY;  Service: Endoscopy;  Laterality: N/A;   ESOPHAGOGASTRODUODENOSCOPY (EGD) WITH PROPOFOL N/A 03/18/2021    Procedure: ESOPHAGOGASTRODUODENOSCOPY (EGD) WITH PROPOFOL;  Surgeon: [name], MD;  Location: [facility] ENDOSCOPY;  Service: Endoscopy;  Laterality: N/A;   LOOP RECORDER INSERTION N/A 08/05/2021    Procedure: LOOP RECORDER INSERTION;  Surgeon: [name], MD;  Location: [facility] INVASIVE CV LAB;  Service: Cardiovascular;  Laterality: N/A;   SPINE SURGERY       TUBAL LIGATION            Patient Active Problem List    Diagnosis Date Noted   Paroxysmal atrial fibrillation (HCC) 08/08/2022   Hypercoagulable state due to paroxysmal atrial fibrillation (HCC) 08/08/2022   Cerebral edema (HCC) 08/07/2021   OSA (obstructive sleep apnea) 08/07/2021   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   CVA (cerebral vascular accident) (HCC) 08/01/2021   GERD (gastroesophageal reflux disease) 07/20/2019   Advanced care planning/counseling discussion 12/17/2016   Skin lesions, generalized 11/13/2016   Chronic right hip pain 06/17/2016   Vitamin D  deficiency 09/26/2015   Diastasis recti 08/08/2015   Elevated serum GGT level 07/24/2015   Essential hypertension 07/17/2015   Fatty liver 07/17/2015   Elevated alkaline phosphatase level 07/17/2015   Elevated serum glutamic pyruvic transaminase (SGPT) level 07/17/2015   Abnormal finding on EKG 07/11/2015   Morbid obesity (HCC) 07/11/2015   Colon, diverticulosis 07/11/2015   Abdominal wall hernia 07/11/2015   DM (diabetes mellitus), type 2 with neurological complications (HCC)     Hyperlipidemia      ONSET DATE: 08/01/2021   REFERRING DIAG: CVA   THERAPY DIAG:  Muscle weakness (generalized)   Rationale for Evaluation and Treatment Rehabilitation   SUBJECTIVE:    SUBJECTIVE STATEMENT:    Pt. Reports left shoulder stiffness today.   Pt accompanied by: self   PERTINENT HISTORY:  Pt. is a 77 y.o. female  who had a unexpectedly hospitalized from 2/12-2/14/2024 for COVID-19, and a UTI.  Pt. 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 Pt. attended inpatient rehab from 08/07/2021-08/22/2021. Pt. received Home health therapy services this past spring.  Pt. has recently had an assessment through driver rehabilitation services at [facility] with recommendations for referrals for  outpatient OT/PT, and ST services. Pt. PMHx includes: HTN, Hyperlipidemia, Macular degeneration, DM, and Obesity.   PRECAUTIONS: None   WEIGHT BEARING RESTRICTIONS No   PAIN:  Are you having pain?  10/10    FALLS: Has patient fallen in last 6 months? Yes.   LIVING ENVIRONMENT: Lives with: lives with their family  Son  [name] Lives in: House/apartment Main living are on one floor Stairs:  2 steps to enter Has following equipment at home: Single point cane, Wheelchair (manual), shower chair, Grab bars, bed rail, and rubber mat.   PLOF: Independent   PATIENT GOALS: To be able to Drive, and cook again   OBJECTIVE:    HAND DOMINANCE: Right    ADLs:  Transfers/ambulation related to ADLs: Independent Eating: Independent Grooming: [employer] Pt. reports being unable to use her left hand to perform haircare.  UB Dressing: Independent pullover shirt, independent now with fastening a bra LB Dressing: Independent donning pants socks, and slide on shoes Toileting: Independent Bathing: Independent Tub Shower transfers: Walk-in shower Independent now    IADLs: Shopping: Needs to be accompanied to the grocery store. Light housekeeping: Do  Meal Prep:  Is able to perform light meal prep Community mobility: Relies on son, and friend Medication management: Son sets up Mining engineer management:TBD Handwriting: TBD   MOBILITY STATUS: Hx of falls out of bed     ACTIVITY TOLERANCE: Activity tolerance:  10-20 min. Before rest break   FUNCTIONAL OUTCOME MEASURES: FOTO: 61   TR score: 62  10/09/2022: FOTO: 49   UPPER EXTREMITY ROM      Active ROM Right eval Left eval Left  10/09/2022 Left 10/23/22 Left 12/02/2022 Left 01/13/2023 Left  03/10/2023  Shoulder flexion WFL 54(74) scaption 0(55) 26(80) 32(82) 35(82) 35(85)  Shoulder abduction WFL 58(75) 32(54) 32(50) 45(60) 47(70) 62(72)  Shoulder adduction           Shoulder extension           Shoulder internal rotation           Shoulder external rotation           Elbow flexion [facility] WFL 145 145 145 145 145  Elbow extension [facility] WFL 10(0) 10(0) WFL WFL WFL  Wrist flexion WFL TBD [facility] WFL  Wrist extension  WFL TBD [facility] WFL  Wrist ulnar deviation           Wrist radial deviation           Wrist pronation           Wrist supination           (Blank rows = not tested)     UPPER EXTREMITY MMT:      MMT Right eval Left eval Left 10/09/2022 Left 10/23/2022 Left 12/02/2022 Left 01/13/2023 Left 03/10/2023  Shoulder flexion 4+/5 2+/5 1/5 2-/5 2-/5 2-/5 2-/5  Shoulder abduction 4+/5 2+/5 2-/5 2-/5 2-/5 2-/5 2-/5  Shoulder adduction           Shoulder extension           Shoulder internal rotation           Shoulder external rotation           Middle trapezius           Lower trapezius           Elbow flexion 4+/5 3+/5 3/5 3/5 3+/5 3+/5 4/5  Elbow extension 4+/5 3+/5 3/5 3/5 3+/5 3+/5 4/5  Wrist flexion 4+/5 TBD 3/5 3/5 3+/5 3+/5 4/5  Wrist extension 4+/5 TBD 3/5 3/5 3+/5 3+/5 4/5  Wrist ulnar deviation           Wrist radial deviation           Wrist pronation           Wrist supination           (Blank rows = not tested)   HAND FUNCTION: Grip strength: Right: 33 lbs; Left: 11 lbs, Lateral pinch: Right: 17 lbs, Left: 2 lbs, and 3 point pinch: Right: 17 lbs, Left: 2 lbs  10/09/2022: Grip strength: Right: 33 lbs; Left: 10 lbs, Lateral pinch: Right: 17 lbs, Left: 1 lbs, and 3 point pinch: Right: 17 lbs, Left: 1 lbs   10/23/2022: Grip strength: Right: 33 lbs; Left: 16 lbs, Lateral pinch:  Right: 17 lbs, Left: 1 lbs, and 3 point pinch: Right: 17 lbs, Left: 1 lbs  12/02/2022: Grip strength: Right: 33 lbs; Left: 17 lbs, Lateral pinch: Right: 17 lbs, Left: 7 lbs, and 3 point pinch: Right: 17 lbs, Left: 3 lbs   01/13/2023: Grip strength: Right: 33 lbs; Left: 15 lbs, Lateral pinch: Right: 17 lbs, Left: 9 lbs, and 3 point pinch: Right: 17 lbs, Left: 7 lbs  03/10/2023: Grip strength: Right: 33 lbs; Left: 15 lbs, Lateral pinch: Right: 17 lbs, Left: 9 lbs, and 3 point pinch: Right: 17 lbs, Left: 7 lbs     COORDINATION:   9 Hole Peg test: Right: 27 sec.  sec; Left: 52 sec.  10/09/2022: 9 Hole Peg test: Right: 27 sec.  sec; Left: 1 min. & 35 sec.  01/13/2023: 9 Hole Peg test: Right: 27 sec.  sec; Left: 51. Sec.  03/10/2023: 9 Hole Peg test: Right: 27 sec.  sec; Left: 54 Sec.   SENSATION: Light touch: WFL Proprioception: WFL   COGNITION: Overall cognitive status: Within functional limits for tasks assessed   VISION: Subjective report: Glasses. Just received a new prescription to increase bifocal strength Baseline vision: Macular Degeneration Visual history: macular degeneration   VISION ASSESSMENT: To be further assessed in functional context  Left sided Awareness      PERCEPTION: Limited left sided awareness     TODAY'S TREATMENT:   Therapeutic Exercise:   Pt. tolerated PROM  in all joint ranges of the left shoulder following moist heat. Shoulder flexion and abduction at the tabletop surface. Pt. worked on [employer], and reciprocal motion using the UBE while seated for 8 min. with minimal resistance. Pt. performed left shoulder flexion, and abduction at the tabletop surface following moist heat to the left shoulder 2/2 pain, and stiffness.   Neuromuscular re-education:    Pt. worked on [facility] skills grasping 1" sticks. Pt. worked on storing the objects in the palm, and translatory skills moving the items from the palm of the hand to the tip of the 2nd digit, and  thumb. Pt. worked on removing the pegs using bilateral alternating hand patterns.    Manual Therapy:   Pt. tolerated scapular mobilizations in elevation, depression, abduction/rotation 2/2 tightness. Manual therapy was performed independent of, and in preparation for ROM.       PATIENT EDUCATION: Education details: BUE strengthening and flexibility exercises Person educated: Patient Education method: verbal cues Education comprehension: verbalized and demonstrated understanding; further training needed.    HOME EXERCISE PROGRAM:  Reviewed home activities to enhance left hand digit extension during typing skills.   GOALS: Goals reviewed with patient? Yes     SHORT TERM GOALS: Target date: 04/21/2023    Pt. Will improve FOTO score by 2 points to reflect improved pt. perceived functional performance Baseline: 03/10/2023: 59 01/13/2023: 49 12/01/2022: FOTO: 51 10/23/2022: FOTO 49 10/09/2022: FOTO 49, FOTO: 61 TR score: 62  Goal status: INITIAL   LONG TERM GOALS: Target date: 06/02/2023    Pt. Will improve left shoulder ROM by 10 degrees to be able able to efficiently apply deodorant Baseline: 03/10/2023: Shoulder flexion: 35(85) 01/13/2023:  Shoulder flexion: 35(82), Abduction: 47(70) 12/02/2022: Shoulder flexion: 32(82), Abduction: 45(60)  10/23/2022: Shoulder flexion: 26(80), Abduction: 32(50) 10/09/2022: flexion: 0(55), Abduction: 32(54) Goal status: Improving, but very limited.   2.  Pt. will be able to independently hold and use a blow dryer, and brush for hair care. Baseline: 03/10/2023: Pt. continues to be able to hold the hair dryer in the left hand, however is unable to reach up to dry it, and has difficulty using a brush 01/13/2023: Pt. Can hold the hair dryer in the left hand, however is unable to reach up to dry it, and has difficulty using a brush. 12/02/2022: Pt. is unable to actively elevate he left shoulder in order to blow dry her hair. 10/23/2022: Pt. continues to be unable to hold  a blow dryer. Eval: Pt. Is unable to sustain her BUEs in elevation, and use a blow dryer, and brush. 10/09/2022: Pt. Is unable to hold a blow dryer. Eval: Pt. Is unable to sustain her BUEs in elevation, and use a blow dryer, and brush. Goal status: Ongoing   3.  Pt. Will improve left lateral pinch strength by 3# to be able to independently cut meat. Baseline: 03/10/2023: Left lateral pinch strength: 9#   01/13/2023: Left lateral pinch strength: 9#  12/02/2022: Left pinch: 7# Pt. has difficulty cutting meat. 10/23/2022: Pt. Continues to present with difficulty cutting meat. 10/09/2022: 1#  Eval: 2#. Pt. has difficulty stabilizing utensil, and food while cutting food. Goal status: Ongoing   4.  Pt. Will improve Left hand [facility] skills to be able to be able to independently, and efficiently manipulate small objects during ADL tasks. Baseline 03/10/2023: 54 sec. 01/13/2023: 51 sec. 12/02/2022: TBD  10/23/2022: Pt. Continues to present with difficulty manipulating small objects. 10/09/2022: Left: 1 min & 35 sec.: Eval: Left: 52  sec. Right: 27 sec. Goal status: Ongoing     6.: Pt. will improve with left grip strength to be able to hold and pull up covers. Baseline: 03/10/2023: left  grip strength: 15# 01/13/2023: left  grip strength: 15# 12/02/2022: 17# 10/23/2022: Left: 16# 10/09/2022: Left: 10# Eval:  Left grip strength 11#. Pt. has difficulty holding a full drink  securely with the left hand.             Goal status: Ongoing   7. Pt. will improve typing speed, and accuracy in preparation for efficiently typing simple email message. Baseline: 03/10/2023: Pt. continues to present with limited speed, and accuracy with typing 01/13/2023: TBD 12/02/2022:  Pt. continues to present with limited speed, and accuracy with typing.10/23/2022: Pt. continues to present with limited speed, and accuracy with typing. 10/09/2022: Pt. presents with difficulty typing. Pt. presents with difficulty typing an email. Typing speed, and accuracy TBD              Goal status: INITIAL                                        CLINICAL IMPRESSION:  Upon arrival, Pt. reports 10/10 stiffness pain in the left shoulder. Pt. reports having had a sleepness night. Pt. has an order for an MRI of the left shoulder, and is waiting to be scheduled. Pt. tolerated the exercises, and FMC tasks well with reports of decreased stiffness following treatment. Pt. continues to present with significantly decreased left shoulder ROM. Pt. continues to present with difficulty using the left hand during hair care, applying deodorant, cutting meat, pulling up covers, and typing an email message. Pt. continues to benefit from OT services to work towards improving Left UE functioning in order to maximize engagement in, and overall independence with ADLs, and IADL tasks.     PERFORMANCE DEFICITS in functional skills including ADLs, IADLs, coordination, dexterity, sensation, ROM, strength, FMC, decreased knowledge of use of DME, vision, and UE functional use, cognitive skills including attention, and psychosocial skills including coping strategies, environmental adaptation, habits, and routines and behaviors.    IMPAIRMENTS are limiting patient from ADLs, IADLs, and social participation.    COMORBIDITIES may have co-morbidities  that affects occupational performance. Patient will benefit from skilled OT to address above impairments and improve overall function.   MODIFICATION OR ASSISTANCE TO COMPLETE EVALUATION: Min-Moderate modification of tasks or assist with assess necessary to complete an evaluation.   OT OCCUPATIONAL PROFILE AND HISTORY: Detailed assessment: Review of records and additional review of physical, cognitive, psychosocial history related to current functional performance.   CLINICAL DECISION MAKING: Moderate - several treatment options, min-mod task modification necessary   REHAB POTENTIAL: Good   EVALUATION COMPLEXITY: Moderate      PLAN: OT FREQUENCY:  2x/week   OT DURATION: 12 weeks   PLANNED INTERVENTIONS: self care/ADL training, therapeutic exercise, therapeutic activity, neuromuscular re-education, manual therapy, passive range of motion, and paraffin   RECOMMENDED OTHER SERVICES: ST, and PT   CONSULTED AND AGREED WITH PLAN OF CARE: Patient   PLAN FOR NEXT SESSION:  Left hand strengthening and coordination exercises   Olegario Messier, MS, OTR/L  03/12/2023

## 2023-03-24 ENCOUNTER — Other Ambulatory Visit: Payer: Self-pay | Admitting: Orthopedic Surgery

## 2023-03-24 ENCOUNTER — Ambulatory Visit: Payer: Medicare HMO | Attending: Adult Health | Admitting: Occupational Therapy

## 2023-03-24 ENCOUNTER — Ambulatory Visit: Payer: Medicare HMO | Admitting: Physical Therapy

## 2023-03-24 DIAGNOSIS — M6281 Muscle weakness (generalized): Secondary | ICD-10-CM | POA: Diagnosis present

## 2023-03-24 DIAGNOSIS — R278 Other lack of coordination: Secondary | ICD-10-CM | POA: Insufficient documentation

## 2023-03-24 DIAGNOSIS — M25312 Other instability, left shoulder: Secondary | ICD-10-CM

## 2023-03-24 DIAGNOSIS — S46002A Unspecified injury of muscle(s) and tendon(s) of the rotator cuff of left shoulder, initial encounter: Secondary | ICD-10-CM

## 2023-03-24 NOTE — Therapy (Addendum)
Occupational Therapy Neuro Treatment Note   Patient Name: Alyssa Mayo MRN: [id number] DOB:15-Apr-1946, 77 y.o., female Today's Date: 03/14/2022   PCP: [name], MD REFERRING PROVIDER: [name], NP     OT End of Session - 03/24/23 1047     Visit Number 44    Number of Visits 72    Date for OT Re-Evaluation 06/02/23    Authorization Type Progress reporting period starting 01/13/2023    OT Start Time 1020    OT Stop Time 1100    OT Time Calculation (min) 40 min    Activity Tolerance Patient tolerated treatment well    Behavior During Therapy [facility] for tasks assessed/performed                                           Past Medical History:  Diagnosis Date   Abnormal levels of other serum enzymes 07/17/2015   Arthritis     Constipation 07/11/2015   COVID-19 03/06/2021   Diabetes mellitus without complication (HCC)     Elevated liver enzymes     Fatty liver     Generalized abdominal pain 07/11/2015   Hyperlipidemia     Hypertension     Lifelong obesity     Macular degeneration     Morbid obesity due to excess calories (HCC) 07/11/2015   Other fatigue 07/11/2015   Sleep apnea     Spinal headache           Past Surgical History:  Procedure Laterality Date   ABDOMINAL HYSTERECTOMY       APPENDECTOMY       CATARACT EXTRACTION       CERVICAL LAMINECTOMY   07/21/1985   CESAREAN SECTION       COLONOSCOPY   07/21/2012    diverticulosis, [facility] Dr. [name]    COLONOSCOPY WITH PROPOFOL N/A 02/04/2019    Procedure: COLONOSCOPY WITH PROPOFOL;  Surgeon: [name], MD;  Location: [facility] ENDOSCOPY;  Service: Endoscopy;  Laterality: N/A;   COLONOSCOPY WITH PROPOFOL N/A 03/18/2021    Procedure: COLONOSCOPY WITH PROPOFOL;  Surgeon: [name], MD;  Location: [facility] ENDOSCOPY;  Service: Endoscopy;  Laterality: N/A;  DM   DIAGNOSTIC LAPAROSCOPY       ESOPHAGOGASTRODUODENOSCOPY (EGD) WITH PROPOFOL N/A 02/04/2019    Procedure:  ESOPHAGOGASTRODUODENOSCOPY (EGD) WITH PROPOFOL;  Surgeon: [name], MD;  Location[facility] ENDOSCOPY;  Service: Endoscopy;  Laterality: N/A;   ESOPHAGOGASTRODUODENOSCOPY (EGD) WITH PROPOFOL N/A 03/18/2021    Procedure: ESOPHAGOGASTRODUODENOSCOPY (EGD) WITH PROPOFOL;  Surgeon: [name], MD;  Location: [facility] ENDOSCOPY;  Service: Endoscopy;  Laterality: N/A;   LOOP RECORDER INSERTION N/A 08/05/2021    Procedure: LOOP RECORDER INSERTION;  Surgeon: [name], MD;  Location: [facility] INVASIVE CV LAB;  Service: Cardiovascular;  Laterality: N/A;   SPINE SURGERY       TUBAL LIGATION            Patient Active Problem List    Diagnosis Date Noted   Paroxysmal atrial fibrillation (HCC) 08/08/2022   Hypercoagulable state due to paroxysmal atrial fibrillation (HCC) 08/08/2022   Cerebral edema (HCC) 08/07/2021   OSA (obstructive sleep apnea) 08/07/2021   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   CVA (cerebral vascular accident) (HCC) 08/01/2021   GERD (gastroesophageal reflux disease) 07/20/2019   Advanced care planning/counseling discussion 12/17/2016   Skin lesions, generalized 11/13/2016  Chronic right hip pain 06/17/2016   Vitamin D deficiency 09/26/2015   Diastasis recti 08/08/2015   Elevated serum GGT level 07/24/2015   Essential hypertension 07/17/2015   Fatty liver 07/17/2015   Elevated alkaline phosphatase level 07/17/2015   Elevated serum glutamic pyruvic transaminase (SGPT) level 07/17/2015   Abnormal finding on EKG 07/11/2015   Morbid obesity (HCC) 07/11/2015   Colon, diverticulosis 07/11/2015   Abdominal wall hernia 07/11/2015   DM (diabetes mellitus), type 2 with neurological complications (HCC)     Hyperlipidemia      ONSET DATE: 08/01/2021   REFERRING DIAG: CVA   THERAPY DIAG:  Muscle weakness (generalized)   Rationale for Evaluation and Treatment Rehabilitation   SUBJECTIVE:    SUBJECTIVE STATEMENT:    Pt. Reports having had a quiet  weekend.  Pt accompanied by: self   PERTINENT HISTORY:  Pt. is a 77 y.o. female who had a unexpectedly hospitalized from 2/12-2/14/2024 for COVID-19, and a UTI.  Pt. 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 Pt. attended inpatient rehab from 08/07/2021-08/22/2021. Pt. received Home health therapy services this past spring.  Pt. has recently had an assessment through driver rehabilitation services at [facility] with recommendations for referrals for  outpatient OT/PT, and ST services. Pt. PMHx includes: HTN, Hyperlipidemia, Macular degeneration, DM, and Obesity.   PRECAUTIONS: None   WEIGHT BEARING RESTRICTIONS No   PAIN:  Are you having pain?  0/10    FALLS: Has patient fallen in last 6 months? Yes.   LIVING ENVIRONMENT: Lives with: lives with their family  Son  [name] Lives in: House/apartment Main living are on one floor Stairs:  2 steps to enter Has following equipment at home: Single point cane, Wheelchair (manual), shower chair, Grab bars, bed rail, and rubber mat.   PLOF: Independent   PATIENT GOALS: To be able to Drive, and cook again   OBJECTIVE:    HAND DOMINANCE: Right    ADLs:  Transfers/ambulation related to ADLs: Independent Eating: Independent Grooming: [employer] Pt. reports being unable to use her left hand to perform haircare.  UB Dressing: Independent pullover shirt, independent now with fastening a bra LB Dressing: Independent donning pants socks, and slide on shoes Toileting: Independent Bathing: Independent Tub Shower transfers: Walk-in shower Independent now    IADLs: Shopping: Needs to be accompanied to the grocery store. Light housekeeping: Do  Meal Prep:  Is able to perform light meal prep Community mobility: Relies on son, and friend Medication management: Son sets up Mining engineer management:TBD Handwriting: TBD   MOBILITY STATUS: Hx of falls out of bed     ACTIVITY TOLERANCE: Activity  tolerance:  10-20 min. Before rest break   FUNCTIONAL OUTCOME MEASURES: FOTO: 61  TR score: 62  10/09/2022: FOTO: 49   UPPER EXTREMITY ROM      Active ROM Right eval Left eval Left  10/09/2022 Left 10/23/22 Left 12/02/2022 Left 01/13/2023 Left  03/10/2023  Shoulder flexion WFL 54(74) scaption 0(55) 26(80) 32(82) 35(82) 35(85)  Shoulder abduction WFL 58(75) 32(54) 32(50) 45(60) 47(70) 62(72)  Shoulder adduction           Shoulder extension           Shoulder internal rotation           Shoulder external rotation           Elbow flexion [facility] WFL 145 145 145 145 145  Elbow extension [facility] WFL 10(0) 10(0) WFL WFL WFL  Wrist flexion [facility]  TBD [facility] WFL  Wrist extension WFL TBD [facility] WFL  Wrist ulnar deviation           Wrist radial deviation           Wrist pronation           Wrist supination           (Blank rows = not tested)     UPPER EXTREMITY MMT:      MMT Right eval Left eval Left 10/09/2022 Left 10/23/2022 Left 12/02/2022 Left 01/13/2023 Left 03/10/2023  Shoulder flexion 4+/5 2+/5 1/5 2-/5 2-/5 2-/5 2-/5  Shoulder abduction 4+/5 2+/5 2-/5 2-/5 2-/5 2-/5 2-/5  Shoulder adduction           Shoulder extension           Shoulder internal rotation           Shoulder external rotation           Middle trapezius           Lower trapezius           Elbow flexion 4+/5 3+/5 3/5 3/5 3+/5 3+/5 4/5  Elbow extension 4+/5 3+/5 3/5 3/5 3+/5 3+/5 4/5  Wrist flexion 4+/5 TBD 3/5 3/5 3+/5 3+/5 4/5  Wrist extension 4+/5 TBD 3/5 3/5 3+/5 3+/5 4/5  Wrist ulnar deviation           Wrist radial deviation           Wrist pronation           Wrist supination           (Blank rows = not tested)   HAND FUNCTION: Grip strength: Right: 33 lbs; Left: 11 lbs, Lateral pinch: Right: 17 lbs, Left: 2 lbs, and 3 point pinch: Right: 17 lbs, Left: 2 lbs  10/09/2022: Grip strength: Right: 33 lbs; Left: 10 lbs, Lateral pinch: Right: 17 lbs, Left: 1 lbs, and 3 point pinch: Right: 17 lbs,  Left: 1 lbs   10/23/2022: Grip strength: Right: 33 lbs; Left: 16 lbs, Lateral pinch: Right: 17 lbs, Left: 1 lbs, and 3 point pinch: Right: 17 lbs, Left: 1 lbs  12/02/2022: Grip strength: Right: 33 lbs; Left: 17 lbs, Lateral pinch: Right: 17 lbs, Left: 7 lbs, and 3 point pinch: Right: 17 lbs, Left: 3 lbs   01/13/2023: Grip strength: Right: 33 lbs; Left: 15 lbs, Lateral pinch: Right: 17 lbs, Left: 9 lbs, and 3 point pinch: Right: 17 lbs, Left: 7 lbs  03/10/2023: Grip strength: Right: 33 lbs; Left: 15 lbs, Lateral pinch: Right: 17 lbs, Left: 9 lbs, and 3 point pinch: Right: 17 lbs, Left: 7 lbs     COORDINATION:   9 Hole Peg test: Right: 27 sec.  sec; Left: 52 sec.  10/09/2022: 9 Hole Peg test: Right: 27 sec.  sec; Left: 1 min. & 35 sec.  01/13/2023: 9 Hole Peg test: Right: 27 sec.  sec; Left: 51. Sec.  03/10/2023: 9 Hole Peg test: Right: 27 sec.  sec; Left: 54 Sec.   SENSATION: Light touch: WFL Proprioception: WFL   COGNITION: Overall cognitive status: Within functional limits for tasks assessed   VISION: Subjective report: Glasses. Just received a new prescription to increase bifocal strength Baseline vision: Macular Degeneration Visual history: macular degeneration   VISION ASSESSMENT: To be further assessed in functional context  Left sided Awareness      PERCEPTION: Limited left sided awareness     TODAY'S TREATMENT:  Therapeutic Exercise:   Pt. tolerated PROM in all joint ranges of the left shoulder following moist heat. Shoulder flexion and abduction at the tabletop surface. Pt. worked on [employer], and reciprocal motion using the UBE while seated for 8 min. with minimal resistance. Pt. performed left shoulder flexion, and abduction at the tabletop surface following moist heat to the left shoulder 2/2 pain, and stiffness.   Neuromuscular re-education:    Pt. worked on [facility] skills grasping 1/2" sticks. Pt. worked on storing the objects in the palm, and translatory  skills moving the items from the palm of the hand to the tip of the 2nd digit, and thumb. Pt. worked on removing them by grasping, and storing the pegs in to palm of her hand.    Manual Therapy:   Pt. tolerated scapular mobilizations in elevation, depression, abduction/rotation 2/2 tightness. Manual therapy was performed independent of, and in preparation for ROM.       PATIENT EDUCATION: Education details: BUE strengthening and flexibility exercises Person educated: Patient Education method: verbal cues Education comprehension: verbalized and demonstrated understanding; further training needed.    HOME EXERCISE PROGRAM:  Reviewed home activities to enhance left hand digit extension during typing skills.   GOALS: Goals reviewed with patient? Yes     SHORT TERM GOALS: Target date: 04/21/2023    Pt. Will improve FOTO score by 2 points to reflect improved pt. perceived functional performance Baseline: 03/10/2023: 59 01/13/2023: 49 12/01/2022: FOTO: 51 10/23/2022: FOTO 49 10/09/2022: FOTO 49, FOTO: 61 TR score: 62  Goal status: INITIAL   LONG TERM GOALS: Target date: 06/02/2023    Pt. Will improve left shoulder ROM by 10 degrees to be able able to efficiently apply deodorant Baseline: 03/10/2023: Shoulder flexion: 35(85) 01/13/2023:  Shoulder flexion: 35(82), Abduction: 47(70) 12/02/2022: Shoulder flexion: 32(82), Abduction: 45(60)  10/23/2022: Shoulder flexion: 26(80), Abduction: 32(50) 10/09/2022: flexion: 0(55), Abduction: 32(54) Goal status: Improving, but very limited.   2.  Pt. will be able to independently hold and use a blow dryer, and brush for hair care. Baseline: 03/10/2023: Pt. continues to be able to hold the hair dryer in the left hand, however is unable to reach up to dry it, and has difficulty using a brush 01/13/2023: Pt. Can hold the hair dryer in the left hand, however is unable to reach up to dry it, and has difficulty using a brush. 12/02/2022: Pt. is unable to actively elevate  he left shoulder in order to blow dry her hair. 10/23/2022: Pt. continues to be unable to hold a blow dryer. Eval: Pt. Is unable to sustain her BUEs in elevation, and use a blow dryer, and brush. 10/09/2022: Pt. Is unable to hold a blow dryer. Eval: Pt. Is unable to sustain her BUEs in elevation, and use a blow dryer, and brush. Goal status: Ongoing   3.  Pt. Will improve left lateral pinch strength by 3# to be able to independently cut meat. Baseline: 03/10/2023: Left lateral pinch strength: 9#   01/13/2023: Left lateral pinch strength: 9#  12/02/2022: Left pinch: 7# Pt. has difficulty cutting meat. 10/23/2022: Pt. Continues to present with difficulty cutting meat. 10/09/2022: 1#  Eval: 2#. Pt. has difficulty stabilizing utensil, and food while cutting food. Goal status: Ongoing   4.  Pt. Will improve Left hand [facility] skills to be able to be able to independently, and efficiently manipulate small objects during ADL tasks. Baseline 03/10/2023: 54 sec. 01/13/2023: 51 sec. 12/02/2022: TBD  10/23/2022: Pt. Continues to present with difficulty  manipulating small objects. 10/09/2022: Left: 1 min & 35 sec.: Eval: Left: 52 sec. Right: 27 sec. Goal status: Ongoing     6.: Pt. will improve with left grip strength to be able to hold and pull up covers. Baseline: 03/10/2023: left  grip strength: 15# 01/13/2023: left  grip strength: 15# 12/02/2022: 17# 10/23/2022: Left: 16# 10/09/2022: Left: 10# Eval:  Left grip strength 11#. Pt. has difficulty holding a full drink  securely with the left hand.             Goal status: Ongoing   7. Pt. will improve typing speed, and accuracy in preparation for efficiently typing simple email message. Baseline: 03/10/2023: Pt. continues to present with limited speed, and accuracy with typing 01/13/2023: TBD 12/02/2022:  Pt. continues to present with limited speed, and accuracy with typing.10/23/2022: Pt. continues to present with limited speed, and accuracy with typing. 10/09/2022: Pt. presents with  difficulty typing. Pt. presents with difficulty typing an email. Typing speed, and accuracy TBD             Goal status: INITIAL                                        CLINICAL IMPRESSION:  Upon arrival, Pt. reports 0/10 stiffness pain in the left shoulder today. Pt. Continues to present with limited left shoulder flexion, and abduction. Pt. reports that she has been sleeping in her lift chair at night. Pt. has an order for an MRI of the left shoulder, and is waiting to be scheduled. Pt. continues to tolerate the exercises, moist heat, manual therapy, and FMC tasks well with reports of decreased stiffness following treatment. Pt. continues to present with significantly decreased left shoulder ROM. Pt. continues to present with difficulty using the left hand during hair care, applying deodorant, cutting meat, pulling up covers, and typing an email message. Pt. continues to benefit from OT services to work towards improving Left UE functioning in order to maximize engagement in, and overall independence with ADLs, and IADL tasks.     PERFORMANCE DEFICITS in functional skills including ADLs, IADLs, coordination, dexterity, sensation, ROM, strength, FMC, decreased knowledge of use of DME, vision, and UE functional use, cognitive skills including attention, and psychosocial skills including coping strategies, environmental adaptation, habits, and routines and behaviors.    IMPAIRMENTS are limiting patient from ADLs, IADLs, and social participation.    COMORBIDITIES may have co-morbidities  that affects occupational performance. Patient will benefit from skilled OT to address above impairments and improve overall function.   MODIFICATION OR ASSISTANCE TO COMPLETE EVALUATION: Min-Moderate modification of tasks or assist with assess necessary to complete an evaluation.   OT OCCUPATIONAL PROFILE AND HISTORY: Detailed assessment: Review of records and additional review of physical, cognitive, psychosocial  history related to current functional performance.   CLINICAL DECISION MAKING: Moderate - several treatment options, min-mod task modification necessary   REHAB POTENTIAL: Good   EVALUATION COMPLEXITY: Moderate      PLAN: OT FREQUENCY: 2x/week   OT DURATION: 12 weeks   PLANNED INTERVENTIONS: self care/ADL training, therapeutic exercise, therapeutic activity, neuromuscular re-education, manual therapy, passive range of motion, and paraffin   RECOMMENDED OTHER SERVICES: ST, and PT   CONSULTED AND AGREED WITH PLAN OF CARE: Patient   PLAN FOR NEXT SESSION:  Left hand strengthening and coordination exercises   Olegario Messier, MS, OTR/L  03/24/2023

## 2023-03-26 ENCOUNTER — Ambulatory Visit: Payer: Medicare HMO | Admitting: Physical Therapy

## 2023-03-26 ENCOUNTER — Ambulatory Visit: Payer: Medicare HMO | Admitting: Occupational Therapy

## 2023-03-26 DIAGNOSIS — M6281 Muscle weakness (generalized): Secondary | ICD-10-CM | POA: Diagnosis not present

## 2023-03-26 DIAGNOSIS — R278 Other lack of coordination: Secondary | ICD-10-CM

## 2023-03-26 NOTE — Therapy (Signed)
Occupational Therapy Neuro Treatment Note   Patient Name: Alyssa Mayo MRN: [id number] DOB:July 01, 1946, 77 y.o., female Today's Date: 03/14/2022   PCP: Alyssa Mina, MD REFERRING PROVIDER: [name], NP     OT End of Session - 03/26/23 1131     Visit Number 45    Number of Visits 72    Date for OT Re-Evaluation 06/02/23    Authorization Type Progress reporting period starting 01/13/2023    OT Start Time 1015    OT Stop Time 1100    OT Time Calculation (min) 45 min    Activity Tolerance Patient tolerated treatment well    Behavior During Therapy [facility] for tasks assessed/performed                                           Past Medical History:  Diagnosis Date   Abnormal levels of other serum enzymes 07/17/2015   Arthritis     Constipation 07/11/2015   COVID-19 03/06/2021   Diabetes mellitus without complication (HCC)     Elevated liver enzymes     Fatty liver     Generalized abdominal pain 07/11/2015   Hyperlipidemia     Hypertension     Lifelong obesity     Macular degeneration     Morbid obesity due to excess calories (HCC) 07/11/2015   Other fatigue 07/11/2015   Sleep apnea     Spinal headache           Past Surgical History:  Procedure Laterality Date   ABDOMINAL HYSTERECTOMY       APPENDECTOMY       CATARACT EXTRACTION       CERVICAL LAMINECTOMY   07/21/1985   CESAREAN SECTION       COLONOSCOPY   07/21/2012    diverticulosis, [facility] Dr. Ricki Mayo    COLONOSCOPY WITH PROPOFOL N/A 02/04/2019    Procedure: COLONOSCOPY WITH PROPOFOL;  Surgeon: Alyssa Deem, MD;  Location: [facility] ENDOSCOPY;  Service: Endoscopy;  Laterality: N/A;   COLONOSCOPY WITH PROPOFOL N/A 03/18/2021    Procedure: COLONOSCOPY WITH PROPOFOL;  Surgeon: Alyssa Bill, MD;  Location: [facility] ENDOSCOPY;  Service: Endoscopy;  Laterality: N/A;  DM   DIAGNOSTIC LAPAROSCOPY       ESOPHAGOGASTRODUODENOSCOPY (EGD) WITH PROPOFOL N/A 02/04/2019    Procedure:  ESOPHAGOGASTRODUODENOSCOPY (EGD) WITH PROPOFOL;  Surgeon: Alyssa Deem, MD;  Location: [facility] ENDOSCOPY;  Service: Endoscopy;  Laterality: N/A;   ESOPHAGOGASTRODUODENOSCOPY (EGD) WITH PROPOFOL N/A 03/18/2021    Procedure: ESOPHAGOGASTRODUODENOSCOPY (EGD) WITH PROPOFOL;  Surgeon: Alyssa Bill, MD;  Location: [facility] ENDOSCOPY;  Service: Endoscopy;  Laterality: N/A;   LOOP RECORDER INSERTION N/A 08/05/2021    Procedure: LOOP RECORDER INSERTION;  Surgeon: Alyssa Maw, MD;  Location: [facility] INVASIVE CV LAB;  Service: Cardiovascular;  Laterality: N/A;   SPINE SURGERY       TUBAL LIGATION            Patient Active Problem List    Diagnosis Date Noted   Paroxysmal atrial fibrillation (HCC) 08/08/2022   Hypercoagulable state due to paroxysmal atrial fibrillation (HCC) 08/08/2022   Cerebral edema (HCC) 08/07/2021   OSA (obstructive sleep apnea) 08/07/2021   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   CVA (cerebral vascular accident) (HCC) 08/01/2021   GERD (gastroesophageal reflux disease) 07/20/2019   Advanced care planning/counseling discussion 12/17/2016   Skin lesions, generalized 11/13/2016  Chronic right hip pain 06/17/2016   Vitamin D deficiency 09/26/2015   Diastasis recti 08/08/2015   Elevated serum GGT level 07/24/2015   Essential hypertension 07/17/2015   Fatty liver 07/17/2015   Elevated alkaline phosphatase level 07/17/2015   Elevated serum glutamic pyruvic transaminase (SGPT) level 07/17/2015   Abnormal finding on EKG 07/11/2015   Morbid obesity (HCC) 07/11/2015   Colon, diverticulosis 07/11/2015   Abdominal wall hernia 07/11/2015   DM (diabetes mellitus), type 2 with neurological complications (HCC)     Hyperlipidemia      ONSET DATE: 08/01/2021   REFERRING DIAG: CVA   THERAPY DIAG:  Muscle weakness (generalized)   Rationale for Evaluation and Treatment Rehabilitation   SUBJECTIVE:    SUBJECTIVE STATEMENT:    Pt. reports having had a quiet  weekend.  Pt accompanied by: self   PERTINENT HISTORY:  Pt. is a 77 y.o. female who had a unexpectedly hospitalized from 2/12-2/14/2024 for COVID-19, and a UTI.  Pt. 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 Pt. attended inpatient rehab from 08/07/2021-08/22/2021. Pt. received Home health therapy services this past spring.  Pt. has recently had an assessment through driver rehabilitation services at [facility] with recommendations for referrals for  outpatient OT/PT, and ST services. Pt. PMHx includes: HTN, Hyperlipidemia, Macular degeneration, DM, and Obesity.   PRECAUTIONS: None   WEIGHT BEARING RESTRICTIONS No   PAIN:  Are you having pain?  0/10    FALLS: Has patient fallen in last 6 months? Yes.   LIVING ENVIRONMENT: Lives with: lives with their family  Son  Alyssa Mayo Lives in: House/apartment Main living are on one floor Stairs:  2 steps to enter Has following equipment at home: Single point cane, Wheelchair (manual), shower chair, Grab bars, bed rail, and rubber mat.   PLOF: Independent   PATIENT GOALS: To be able to Drive, and cook again   OBJECTIVE:    HAND DOMINANCE: Right    ADLs:  Transfers/ambulation related to ADLs: Independent Eating: Independent Grooming: [employer] Pt. reports being unable to use her left hand to perform haircare.  UB Dressing: Independent pullover shirt, independent now with fastening a bra LB Dressing: Independent donning pants socks, and slide on shoes Toileting: Independent Bathing: Independent Tub Shower transfers: Walk-in shower Independent now    IADLs: Shopping: Needs to be accompanied to the grocery store. Light housekeeping: Do  Meal Prep:  Is able to perform light meal prep Community mobility: Relies on son, and friend Medication management: Son sets up Mining engineer management:TBD Handwriting: TBD   MOBILITY STATUS: Hx of falls out of bed     ACTIVITY TOLERANCE: Activity  tolerance:  10-20 min. Before rest break   FUNCTIONAL OUTCOME MEASURES: FOTO: 61  TR score: 62  10/09/2022: FOTO: 49   UPPER EXTREMITY ROM      Active ROM Right eval Left eval Left  10/09/2022 Left 10/23/22 Left 12/02/2022 Left 01/13/2023 Left  03/10/2023  Shoulder flexion WFL 54(74) scaption 0(55) 26(80) 32(82) 35(82) 35(85)  Shoulder abduction WFL 58(75) 32(54) 32(50) 45(60) 47(70) 62(72)  Shoulder adduction           Shoulder extension           Shoulder internal rotation           Shoulder external rotation           Elbow flexion [facility] WFL 145 145 145 145 145  Elbow extension [facility] WFL 10(0) 10(0) WFL WFL WFL  Wrist flexion [facility]  TBD [facility] WFL  Wrist extension WFL TBD [facility] WFL  Wrist ulnar deviation           Wrist radial deviation           Wrist pronation           Wrist supination           (Blank rows = not tested)     UPPER EXTREMITY MMT:      MMT Right eval Left eval Left 10/09/2022 Left 10/23/2022 Left 12/02/2022 Left 01/13/2023 Left 03/10/2023  Shoulder flexion 4+/5 2+/5 1/5 2-/5 2-/5 2-/5 2-/5  Shoulder abduction 4+/5 2+/5 2-/5 2-/5 2-/5 2-/5 2-/5  Shoulder adduction           Shoulder extension           Shoulder internal rotation           Shoulder external rotation           Middle trapezius           Lower trapezius           Elbow flexion 4+/5 3+/5 3/5 3/5 3+/5 3+/5 4/5  Elbow extension 4+/5 3+/5 3/5 3/5 3+/5 3+/5 4/5  Wrist flexion 4+/5 TBD 3/5 3/5 3+/5 3+/5 4/5  Wrist extension 4+/5 TBD 3/5 3/5 3+/5 3+/5 4/5  Wrist ulnar deviation           Wrist radial deviation           Wrist pronation           Wrist supination           (Blank rows = not tested)   HAND FUNCTION: Grip strength: Right: 33 lbs; Left: 11 lbs, Lateral pinch: Right: 17 lbs, Left: 2 lbs, and 3 point pinch: Right: 17 lbs, Left: 2 lbs  10/09/2022: Grip strength: Right: 33 lbs; Left: 10 lbs, Lateral pinch: Right: 17 lbs, Left: 1 lbs, and 3 point pinch: Right: 17 lbs,  Left: 1 lbs   10/23/2022: Grip strength: Right: 33 lbs; Left: 16 lbs, Lateral pinch: Right: 17 lbs, Left: 1 lbs, and 3 point pinch: Right: 17 lbs, Left: 1 lbs  12/02/2022: Grip strength: Right: 33 lbs; Left: 17 lbs, Lateral pinch: Right: 17 lbs, Left: 7 lbs, and 3 point pinch: Right: 17 lbs, Left: 3 lbs   01/13/2023: Grip strength: Right: 33 lbs; Left: 15 lbs, Lateral pinch: Right: 17 lbs, Left: 9 lbs, and 3 point pinch: Right: 17 lbs, Left: 7 lbs  03/10/2023: Grip strength: Right: 33 lbs; Left: 15 lbs, Lateral pinch: Right: 17 lbs, Left: 9 lbs, and 3 point pinch: Right: 17 lbs, Left: 7 lbs     COORDINATION:   9 Hole Peg test: Right: 27 sec.  sec; Left: 52 sec.  10/09/2022: 9 Hole Peg test: Right: 27 sec.  sec; Left: 1 min. & 35 sec.  01/13/2023: 9 Hole Peg test: Right: 27 sec.  sec; Left: 51. Sec.  03/10/2023: 9 Hole Peg test: Right: 27 sec.  sec; Left: 54 Sec.   SENSATION: Light touch: WFL Proprioception: WFL   COGNITION: Overall cognitive status: Within functional limits for tasks assessed   VISION: Subjective report: Glasses. Just received a new prescription to increase bifocal strength Baseline vision: Macular Degeneration Visual history: macular degeneration   VISION ASSESSMENT: To be further assessed in functional context  Left sided Awareness      PERCEPTION: Limited left sided awareness     TODAY'S TREATMENT:  Therapeutic Exercise:   Pt. tolerated PROM in all joint ranges of the left shoulder following moist heat. Shoulder flexion and abduction at the tabletop surface. Pt. worked on [employer], and reciprocal motion using the UBE while seated for 8 min. with minimal resistance. Pt. performed left shoulder flexion, and abduction at the tabletop surface following moist heat to the left shoulder 2/2 pain, and stiffness.   Neuromuscular re-education:    Pt. worked on [facility] skills grasping 1/2" sticks. Pt. worked on storing the objects in the palm, and translatory  skills moving the items from the palm of the hand to the tip of the 2nd digit, and thumb. Pt. worked on removing them by grasping, and storing the pegs in to palm of her hand.    Manual Therapy:   Pt. tolerated scapular mobilizations in elevation, depression, abduction/rotation 2/2 tightness. Manual therapy was performed independent of, and in preparation for ROM.       PATIENT EDUCATION: Education details: BUE strengthening and flexibility exercises Person educated: Patient Education method: verbal cues Education comprehension: verbalized and demonstrated understanding; further training needed.    HOME EXERCISE PROGRAM:  Reviewed home activities to enhance left hand digit extension during typing skills.   GOALS: Goals reviewed with patient? Yes     SHORT TERM GOALS: Target date: 04/21/2023    Pt. Will improve FOTO score by 2 points to reflect improved pt. perceived functional performance Baseline: 03/10/2023: 59 01/13/2023: 49 12/01/2022: FOTO: 51 10/23/2022: FOTO 49 10/09/2022: FOTO 49, FOTO: 61 TR score: 62  Goal status: INITIAL   LONG TERM GOALS: Target date: 06/02/2023    Pt. Will improve left shoulder ROM by 10 degrees to be able able to efficiently apply deodorant Baseline: 03/10/2023: Shoulder flexion: 35(85) 01/13/2023:  Shoulder flexion: 35(82), Abduction: 47(70) 12/02/2022: Shoulder flexion: 32(82), Abduction: 45(60)  10/23/2022: Shoulder flexion: 26(80), Abduction: 32(50) 10/09/2022: flexion: 0(55), Abduction: 32(54) Goal status: Improving, but very limited.   2.  Pt. will be able to independently hold and use a blow dryer, and brush for hair care. Baseline: 03/10/2023: Pt. continues to be able to hold the hair dryer in the left hand, however is unable to reach up to dry it, and has difficulty using a brush 01/13/2023: Pt. Can hold the hair dryer in the left hand, however is unable to reach up to dry it, and has difficulty using a brush. 12/02/2022: Pt. is unable to actively elevate  he left shoulder in order to blow dry her hair. 10/23/2022: Pt. continues to be unable to hold a blow dryer. Eval: Pt. Is unable to sustain her BUEs in elevation, and use a blow dryer, and brush. 10/09/2022: Pt. Is unable to hold a blow dryer. Eval: Pt. Is unable to sustain her BUEs in elevation, and use a blow dryer, and brush. Goal status: Ongoing   3.  Pt. Will improve left lateral pinch strength by 3# to be able to independently cut meat. Baseline: 03/10/2023: Left lateral pinch strength: 9#   01/13/2023: Left lateral pinch strength: 9#  12/02/2022: Left pinch: 7# Pt. has difficulty cutting meat. 10/23/2022: Pt. Continues to present with difficulty cutting meat. 10/09/2022: 1#  Eval: 2#. Pt. has difficulty stabilizing utensil, and food while cutting food. Goal status: Ongoing   4.  Pt. Will improve Left hand [facility] skills to be able to be able to independently, and efficiently manipulate small objects during ADL tasks. Baseline 03/10/2023: 54 sec. 01/13/2023: 51 sec. 12/02/2022: TBD  10/23/2022: Pt. Continues to present with difficulty  manipulating small objects. 10/09/2022: Left: 1 min & 35 sec.: Eval: Left: 52 sec. Right: 27 sec. Goal status: Ongoing     6.: Pt. will improve with left grip strength to be able to hold and pull up covers. Baseline: 03/10/2023: left  grip strength: 15# 01/13/2023: left  grip strength: 15# 12/02/2022: 17# 10/23/2022: Left: 16# 10/09/2022: Left: 10# Eval:  Left grip strength 11#. Pt. has difficulty holding a full drink  securely with the left hand.             Goal status: Ongoing   7. Pt. will improve typing speed, and accuracy in preparation for efficiently typing simple email message. Baseline: 03/10/2023: Pt. continues to present with limited speed, and accuracy with typing 01/13/2023: TBD 12/02/2022:  Pt. continues to present with limited speed, and accuracy with typing.10/23/2022: Pt. continues to present with limited speed, and accuracy with typing. 10/09/2022: Pt. presents with  difficulty typing. Pt. presents with difficulty typing an email. Typing speed, and accuracy TBD             Goal status: INITIAL                                        CLINICAL IMPRESSION:  Pt. continues to report 0/10 stiffness pain in the left shoulder today. Pt. continues to present with limited left shoulder flexion, and abduction. Pt. reports that she has been sleeping in her lift chair at night. Pt. has an order for an MRI of the left shoulder, and is waiting to be scheduled. Pt. continues to tolerate the exercises, moist heat, manual therapy, and FMC tasks well with reports of decreased stiffness following treatment. Pt. continues to present with significantly decreased left shoulder ROM. Pt. continues to present with difficulty using the left hand during hair care, applying deodorant, cutting meat, pulling up covers, and typing an email message. Pt. continues to benefit from OT services to work towards improving Left UE functioning in order to maximize engagement in, and overall independence with ADLs, and IADL tasks.     PERFORMANCE DEFICITS in functional skills including ADLs, IADLs, coordination, dexterity, sensation, ROM, strength, FMC, decreased knowledge of use of DME, vision, and UE functional use, cognitive skills including attention, and psychosocial skills including coping strategies, environmental adaptation, habits, and routines and behaviors.    IMPAIRMENTS are limiting patient from ADLs, IADLs, and social participation.    COMORBIDITIES may have co-morbidities  that affects occupational performance. Patient will benefit from skilled OT to address above impairments and improve overall function.   MODIFICATION OR ASSISTANCE TO COMPLETE EVALUATION: Min-Moderate modification of tasks or assist with assess necessary to complete an evaluation.   OT OCCUPATIONAL PROFILE AND HISTORY: Detailed assessment: Review of records and additional review of physical, cognitive, psychosocial  history related to current functional performance.   CLINICAL DECISION MAKING: Moderate - several treatment options, min-mod task modification necessary   REHAB POTENTIAL: Good   EVALUATION COMPLEXITY: Moderate      PLAN: OT FREQUENCY: 2x/week   OT DURATION: 12 weeks   PLANNED INTERVENTIONS: self care/ADL training, therapeutic exercise, therapeutic activity, neuromuscular re-education, manual therapy, passive range of motion, and paraffin   RECOMMENDED OTHER SERVICES: ST, and PT   CONSULTED AND AGREED WITH PLAN OF CARE: Patient   PLAN FOR NEXT SESSION:  Left hand strengthening and coordination exercises   Olegario Messier, MS, OTR/L  03/26/2023

## 2023-03-27 ENCOUNTER — Ambulatory Visit (HOSPITAL_COMMUNITY)
Admission: RE | Admit: 2023-03-27 | Discharge: 2023-03-27 | Disposition: A | Discharge status: Home / Self Care | Payer: Medicare HMO | Source: Ambulatory Visit | Attending: Physician Assistant | Admitting: Physician Assistant

## 2023-03-27 VITALS — BP 116/50 | HR 88 | Ht 63.0 in | Wt 183.8 lb

## 2023-03-27 DIAGNOSIS — D6869 Other thrombophilia: Secondary | ICD-10-CM

## 2023-03-27 DIAGNOSIS — Z79899 Other long term (current) drug therapy: Secondary | ICD-10-CM | POA: Diagnosis not present

## 2023-03-27 DIAGNOSIS — Z7901 Long term (current) use of anticoagulants: Secondary | ICD-10-CM | POA: Insufficient documentation

## 2023-03-27 DIAGNOSIS — E119 Type 2 diabetes mellitus without complications: Secondary | ICD-10-CM | POA: Diagnosis not present

## 2023-03-27 DIAGNOSIS — I10 Essential (primary) hypertension: Secondary | ICD-10-CM | POA: Insufficient documentation

## 2023-03-27 DIAGNOSIS — I480 Paroxysmal atrial fibrillation: Secondary | ICD-10-CM | POA: Diagnosis present

## 2023-03-27 DIAGNOSIS — G4733 Obstructive sleep apnea (adult) (pediatric): Secondary | ICD-10-CM | POA: Insufficient documentation

## 2023-03-27 DIAGNOSIS — Z7984 Long term (current) use of oral hypoglycemic drugs: Secondary | ICD-10-CM | POA: Insufficient documentation

## 2023-03-27 DIAGNOSIS — E785 Hyperlipidemia, unspecified: Secondary | ICD-10-CM | POA: Diagnosis not present

## 2023-03-27 NOTE — Progress Notes (Signed)
Primary Care Physician: [name], MD Primary Cardiologist: Dr Kirke Corin (remotely) Primary Electrophysiologist: Dr Ladona Ridgel Referring Physician: Dr Bartolo [facility]   Alyssa Mayo is a 77 y.o. female with a history of DM, HTN, HLD, OSA, atrial fibrillation who presents for follow up in the [facility].  The patient was initially diagnosed with atrial fibrillation 06/2022 on her ILR which was placed 07/2021 for cryptogenic stroke. The episode lasted 3-4 hours. She did feel palpitations in her back during that time. Patient has a CHADS2VASC score of 7. She denies significant alcohol use and is compliant with her CPAP therapy.   On follow up today, patient reports that she has done well from a cardiac standpoint. ILR shows 0% afib burden since her last visit. She did have a mechanical fall in March and injured her shoulder. She is scheduled for an MRI. No bleeding issues on anticoagulation.   Today, she denies symptoms of palpitations, chest pain, shortness of breath, orthopnea, PND, lower extremity edema, dizziness, presyncope, syncope, snoring, daytime somnolence, bleeding. The patient is tolerating medications without difficulties and is otherwise without complaint today.    Atrial Fibrillation Risk Factors:  she does have symptoms or diagnosis of sleep apnea. she is compliant with CPAP therapy. she does not have a history of rheumatic fever. she does not have a history of alcohol use. The patient does not have a history of early familial atrial fibrillation or other arrhythmias.   Atrial Fibrillation Management history:  Previous antiarrhythmic drugs: none Previous cardioversions: none Previous ablations: none Anticoagulation history: Eliquis   Past Medical History:  Diagnosis Date   Abnormal levels of other serum enzymes 07/17/2015   Arthritis    Constipation 07/11/2015   COVID-19 03/06/2021   Diabetes mellitus without complication (HCC)     Elevated liver enzymes    Fatty liver    Generalized abdominal pain 07/11/2015   Hyperlipidemia    Hypertension    Lifelong obesity    Macular degeneration    Morbid obesity due to excess calories (HCC) 07/11/2015   Other fatigue 07/11/2015   Sleep apnea    Spinal headache     Current Outpatient Medications  Medication Sig Dispense Refill   acetaminophen (TYLENOL) 325 MG tablet Take 2 tablets (650 mg total) by mouth every 4 (four) hours as needed for mild pain (or temp > 37.5 C (99.5 F)). (Patient taking differently: Take 650 mg by mouth as needed for mild pain (or temp > 37.5 C (99.5 F)).)     amLODipine (NORVASC) 2.5 MG tablet Take 1 tablet (2.5 mg total) by mouth daily. 30 tablet 0   apixaban (ELIQUIS) 5 MG TABS tablet Take 1 tablet (5 mg total) by mouth 2 (two) times daily. 60 tablet 6   atorvastatin (LIPITOR) 20 MG tablet Take 20 mg by mouth daily.     dapagliflozin propanediol (FARXIGA) 10 MG TABS tablet Take 10 mg by mouth daily. 90 tablet 1   gabapentin (NEURONTIN) 100 MG capsule Take 1 capsule (100 mg total) by mouth at bedtime. 90 capsule 0   glipiZIDE (GLUCOTROL XL) 5 MG 24 hr tablet Take 1 tablet (5 mg total) by mouth 2 (two) times daily with a meal. (Patient taking differently: Take 2.5 mg by mouth 2 (two) times daily with a meal. 5 mg in am and 2.5 mg in the pm) 60 tablet 0   magnesium oxide (MAG-OX) 400 MG tablet Take 1 tablet (400 mg total) by mouth at bedtime. 30  tablet 0   memantine (NAMENDA) 10 MG tablet Take 1 tablet (10 mg total) by mouth 2 (two) times daily. 180 tablet 3   metFORMIN (GLUCOPHAGE) 500 MG tablet Take 2 tablets (1,000 mg total) by mouth 2 (two) times daily. 120 tablet 0   Multiple Vitamins-Minerals (PRESERVISION AREDS 2) CAPS Take 1 capsule by mouth 2 (two) times daily.     pantoprazole (PROTONIX) 40 MG tablet Take 1 tablet (40 mg total) by mouth daily. 30 tablet 0   Current Facility-Administered Medications  Medication Dose Route Frequency Provider  Last Rate Last Admin   capsaicin topical system 8 % patch 4 patch  4 patch Topical Once Raulkar, Drema Pry, MD        ROS- All systems are reviewed and negative except as per the HPI above.  Physical Exam: Vitals:   03/27/23 0925  BP: (!) 116/50  Pulse: 88  Weight: 83.4 kg  Height: 5\' 3"  (1.6 m)    GEN: Well nourished, well developed in no acute distress NECK: No JVD; No carotid bruits CARDIAC: Regular rate and rhythm, no murmurs, rubs, gallops RESPIRATORY:  Clear to auscultation without rales, wheezing or rhonchi  ABDOMEN: Soft, non-tender, non-distended EXTREMITIES:  No edema; No deformity    Wt Readings from Last 3 Encounters:  03/27/23 83.4 kg  02/26/23 82.6 kg  11/07/22 83.6 kg    EKG today demonstrates  SR Vent. rate 88 BPM PR interval 156 ms QRS duration 72 ms QT/QTcB 334/404 ms  Echo 08/02/21 demonstrated   1. Technically difficult study with poor echo windows. Patient declined  Definity contrast.   2. Left ventricular ejection fraction, by estimation, is 55 to 60%. The  left ventricle has normal function. Left ventricular endocardial border  not optimally defined to evaluate regional wall motion. Left ventricular  diastolic parameters are consistent  with Grade I diastolic dysfunction (impaired relaxation).   3. Right ventricular systolic function is normal. The right ventricular  size is normal. The RV wall appears thickened, however, this may be due to  a prominent fat pad.   4. The mitral valve is normal in structure. Trivial mitral valve  regurgitation.   5. The aortic valve is tricuspid. There is mild calcification of the  aortic valve. There is mild thickening of the aortic valve. Aortic valve  regurgitation is not visualized. Aortic valve sclerosis/calcification is  present, without any evidence of  aortic stenosis.   6. The inferior vena cava is normal in size with greater than 50%  respiratory variability, suggesting right atrial pressure of 3  mmHg.   Conclusion(s)/Recommendation(s): No intracardiac source of embolism  detected on this transthoracic study. Consider a transesophageal  echocardiogram to exclude cardiac source of embolism if clinically  indicated.   Epic records are reviewed at length today  CHA2DS2-VASc Score = 7  The patient's score is based upon: CHF History: 0 HTN History: 1 Diabetes History: 1 Stroke History: 2 Vascular Disease History: 0 Age Score: 2 Gender Score: 1       ASSESSMENT AND PLAN: Paroxysmal Atrial Fibrillation (ICD10:  I48.0) The patient's CHA2DS2-VASc score is 7, indicating a 11.2% annual risk of stroke.   ILR shows 0% afib burden with no interim episodes.  Continue Eliquis 5 mg BID  Secondary Hypercoagulable State (ICD10:  D68.69) The patient is at significant risk for stroke/thromboembolism based upon her CHA2DS2-VASc Score of 7.  Continue Apixaban (Eliquis).   OSA  Encouraged nightly CPAP  HTN Stable on current regimen   Follow  up in the AF clinic in one year.    Jorja Loa PA-C Afib [facility] [address] [place], [place] [address] [phone] 03/27/2023 10:12 AM

## 2023-03-31 ENCOUNTER — Ambulatory Visit: Payer: Medicare HMO | Admitting: Occupational Therapy

## 2023-03-31 ENCOUNTER — Ambulatory Visit: Payer: Medicare HMO | Admitting: Physical Therapy

## 2023-04-02 ENCOUNTER — Ambulatory Visit: Payer: Medicare HMO | Admitting: Occupational Therapy

## 2023-04-02 ENCOUNTER — Ambulatory Visit: Payer: Medicare HMO | Admitting: Physical Therapy

## 2023-04-02 ENCOUNTER — Ambulatory Visit
Admission: RE | Admit: 2023-04-02 | Discharge: 2023-04-02 | Disposition: A | Discharge status: Home / Self Care | Payer: Medicare HMO | Source: Ambulatory Visit | Attending: Orthopedic Surgery | Admitting: Orthopedic Surgery

## 2023-04-02 DIAGNOSIS — S46002A Unspecified injury of muscle(s) and tendon(s) of the rotator cuff of left shoulder, initial encounter: Secondary | ICD-10-CM | POA: Insufficient documentation

## 2023-04-02 DIAGNOSIS — M6281 Muscle weakness (generalized): Secondary | ICD-10-CM

## 2023-04-02 DIAGNOSIS — M25312 Other instability, left shoulder: Secondary | ICD-10-CM | POA: Insufficient documentation

## 2023-04-02 DIAGNOSIS — R278 Other lack of coordination: Secondary | ICD-10-CM

## 2023-04-02 NOTE — Therapy (Signed)
Occupational Therapy Neuro Treatment Note   Patient Name: Alyssa Mayo MRN: [id number] DOB:Nov 24, 1945, 77 y.o., female Today's Date: 03/14/2022   PCP: [name], MD REFERRING PROVIDER: [name], NP     OT End of Session - 04/02/23 1335     Visit Number 46    Number of Visits 72    Date for OT Re-Evaluation 06/02/23    Authorization Type Progress reporting period starting 01/13/2023    OT Start Time 1022    OT Stop Time 1100    OT Time Calculation (min) 38 min    Activity Tolerance Patient tolerated treatment well    Behavior During Therapy [facility] for tasks assessed/performed                                           Past Medical History:  Diagnosis Date   Abnormal levels of other serum enzymes 07/17/2015   Arthritis     Constipation 07/11/2015   COVID-19 03/06/2021   Diabetes mellitus without complication (HCC)     Elevated liver enzymes     Fatty liver     Generalized abdominal pain 07/11/2015   Hyperlipidemia     Hypertension     Lifelong obesity     Macular degeneration     Morbid obesity due to excess calories (HCC) 07/11/2015   Other fatigue 07/11/2015   Sleep apnea     Spinal headache           Past Surgical History:  Procedure Laterality Date   ABDOMINAL HYSTERECTOMY       APPENDECTOMY       CATARACT EXTRACTION       CERVICAL LAMINECTOMY   07/21/1985   CESAREAN SECTION       COLONOSCOPY   07/21/2012    diverticulosis, [facility] Dr. [name]    COLONOSCOPY WITH PROPOFOL N/A 02/04/2019    Procedure: COLONOSCOPY WITH PROPOFOL;  Surgeon: [name], MD;  Location: [facility] ENDOSCOPY;  Service: Endoscopy;  Laterality: N/A;   COLONOSCOPY WITH PROPOFOL N/A 03/18/2021    Procedure: COLONOSCOPY WITH PROPOFOL;  Surgeon: [name], MD;  Location: [facility] ENDOSCOPY;  Service: Endoscopy;  Laterality: N/A;  DM   DIAGNOSTIC LAPAROSCOPY       ESOPHAGOGASTRODUODENOSCOPY (EGD) WITH PROPOFOL N/A 02/04/2019    Procedure:  ESOPHAGOGASTRODUODENOSCOPY (EGD) WITH PROPOFOL;  Surgeon: [name], MD;  Location: [facility] ENDOSCOPY;  Service: Endoscopy;  Laterality: N/A;   ESOPHAGOGASTRODUODENOSCOPY (EGD) WITH PROPOFOL N/A 03/18/2021    Procedure: ESOPHAGOGASTRODUODENOSCOPY (EGD) WITH PROPOFOL;  Surgeon: [name], MD;  Location: [facility] ENDOSCOPY;  Service: Endoscopy;  Laterality: N/A;   LOOP RECORDER INSERTION N/A 08/05/2021    Procedure: LOOP RECORDER INSERTION;  Surgeon: [name], MD;  Location: [facility] INVASIVE CV LAB;  Service: Cardiovascular;  Laterality: N/A;   SPINE SURGERY       TUBAL LIGATION            Patient Active Problem List    Diagnosis Date Noted   Paroxysmal atrial fibrillation (HCC) 08/08/2022   Hypercoagulable state due to paroxysmal atrial fibrillation (HCC) 08/08/2022   Cerebral edema (HCC) 08/07/2021   OSA (obstructive sleep apnea) 08/07/2021   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   CVA (cerebral vascular accident) (HCC) 08/01/2021   GERD (gastroesophageal reflux disease) 07/20/2019   Advanced care planning/counseling discussion 12/17/2016   Skin lesions, generalized 11/13/2016  Chronic right hip pain 06/17/2016   Vitamin D deficiency 09/26/2015   Diastasis recti 08/08/2015   Elevated serum GGT level 07/24/2015   Essential hypertension 07/17/2015   Fatty liver 07/17/2015   Elevated alkaline phosphatase level 07/17/2015   Elevated serum glutamic pyruvic transaminase (SGPT) level 07/17/2015   Abnormal finding on EKG 07/11/2015   Morbid obesity (HCC) 07/11/2015   Colon, diverticulosis 07/11/2015   Abdominal wall hernia 07/11/2015   DM (diabetes mellitus), type 2 with neurological complications (HCC)     Hyperlipidemia      ONSET DATE: 08/01/2021   REFERRING DIAG: CVA   THERAPY DIAG:  Muscle weakness (generalized)   Rationale for Evaluation and Treatment Rehabilitation   SUBJECTIVE:    SUBJECTIVE STATEMENT:    Pt. reports that she, and her family  have been sick this week.  Pt accompanied by: self   PERTINENT HISTORY:  Pt. is a 77 y.o. female who had a unexpectedly hospitalized from 2/12-2/14/2024 for COVID-19, and a UTI.  Pt. 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 Pt. attended inpatient rehab from 08/07/2021-08/22/2021. Pt. received Home health therapy services this past spring.  Pt. has recently had an assessment through driver rehabilitation services at [facility] with recommendations for referrals for  outpatient OT/PT, and ST services. Pt. PMHx includes: HTN, Hyperlipidemia, Macular degeneration, DM, and Obesity.   PRECAUTIONS: None   WEIGHT BEARING RESTRICTIONS No   PAIN:  Are you having pain?  0/10    FALLS: Has patient fallen in last 6 months? Yes.   LIVING ENVIRONMENT: Lives with: lives with their family  Son  [name] Lives in: House/apartment Main living are on one floor Stairs:  2 steps to enter Has following equipment at home: Single point cane, Wheelchair (manual), shower chair, Grab bars, bed rail, and rubber mat.   PLOF: Independent   PATIENT GOALS: To be able to Drive, and cook again   OBJECTIVE:    HAND DOMINANCE: Right    ADLs:  Transfers/ambulation related to ADLs: Independent Eating: Independent Grooming: [employer] Pt. reports being unable to use her left hand to perform haircare.  UB Dressing: Independent pullover shirt, independent now with fastening a bra LB Dressing: Independent donning pants socks, and slide on shoes Toileting: Independent Bathing: Independent Tub Shower transfers: Walk-in shower Independent now    IADLs: Shopping: Needs to be accompanied to the grocery store. Light housekeeping: Do  Meal Prep:  Is able to perform light meal prep Community mobility: Relies on son, and friend Medication management: Son sets up Mining engineer management:TBD Handwriting: TBD   MOBILITY STATUS: Hx of falls out of bed     ACTIVITY  TOLERANCE: Activity tolerance:  10-20 min. Before rest break   FUNCTIONAL OUTCOME MEASURES: FOTO: 61  TR score: 62  10/09/2022: FOTO: 49   UPPER EXTREMITY ROM      Active ROM Right eval Left eval Left  10/09/2022 Left 10/23/22 Left 12/02/2022 Left 01/13/2023 Left  03/10/2023  Shoulder flexion WFL 54(74) scaption 0(55) 26(80) 32(82) 35(82) 35(85)  Shoulder abduction WFL 58(75) 32(54) 32(50) 45(60) 47(70) 62(72)  Shoulder adduction           Shoulder extension           Shoulder internal rotation           Shoulder external rotation           Elbow flexion [facility] WFL 145 145 145 145 145  Elbow extension [facility] WFL 10(0) 10(0) [facility] WFL  WFL  Wrist flexion WFL TBD [facility] WFL  Wrist extension WFL TBD [facility] WFL  Wrist ulnar deviation           Wrist radial deviation           Wrist pronation           Wrist supination           (Blank rows = not tested)     UPPER EXTREMITY MMT:      MMT Right eval Left eval Left 10/09/2022 Left 10/23/2022 Left 12/02/2022 Left 01/13/2023 Left 03/10/2023  Shoulder flexion 4+/5 2+/5 1/5 2-/5 2-/5 2-/5 2-/5  Shoulder abduction 4+/5 2+/5 2-/5 2-/5 2-/5 2-/5 2-/5  Shoulder adduction           Shoulder extension           Shoulder internal rotation           Shoulder external rotation           Middle trapezius           Lower trapezius           Elbow flexion 4+/5 3+/5 3/5 3/5 3+/5 3+/5 4/5  Elbow extension 4+/5 3+/5 3/5 3/5 3+/5 3+/5 4/5  Wrist flexion 4+/5 TBD 3/5 3/5 3+/5 3+/5 4/5  Wrist extension 4+/5 TBD 3/5 3/5 3+/5 3+/5 4/5  Wrist ulnar deviation           Wrist radial deviation           Wrist pronation           Wrist supination           (Blank rows = not tested)   HAND FUNCTION: Grip strength: Right: 33 lbs; Left: 11 lbs, Lateral pinch: Right: 17 lbs, Left: 2 lbs, and 3 point pinch: Right: 17 lbs, Left: 2 lbs  10/09/2022: Grip strength: Right: 33 lbs; Left: 10 lbs, Lateral pinch: Right: 17 lbs, Left: 1 lbs, and 3 point  pinch: Right: 17 lbs, Left: 1 lbs   10/23/2022: Grip strength: Right: 33 lbs; Left: 16 lbs, Lateral pinch: Right: 17 lbs, Left: 1 lbs, and 3 point pinch: Right: 17 lbs, Left: 1 lbs  12/02/2022: Grip strength: Right: 33 lbs; Left: 17 lbs, Lateral pinch: Right: 17 lbs, Left: 7 lbs, and 3 point pinch: Right: 17 lbs, Left: 3 lbs   01/13/2023: Grip strength: Right: 33 lbs; Left: 15 lbs, Lateral pinch: Right: 17 lbs, Left: 9 lbs, and 3 point pinch: Right: 17 lbs, Left: 7 lbs  03/10/2023: Grip strength: Right: 33 lbs; Left: 15 lbs, Lateral pinch: Right: 17 lbs, Left: 9 lbs, and 3 point pinch: Right: 17 lbs, Left: 7 lbs     COORDINATION:   9 Hole Peg test: Right: 27 sec.  sec; Left: 52 sec.  10/09/2022: 9 Hole Peg test: Right: 27 sec.  sec; Left: 1 min. & 35 sec.  01/13/2023: 9 Hole Peg test: Right: 27 sec.  sec; Left: 51. Sec.  03/10/2023: 9 Hole Peg test: Right: 27 sec.  sec; Left: 54 Sec.   SENSATION: Light touch: WFL Proprioception: WFL   COGNITION: Overall cognitive status: Within functional limits for tasks assessed   VISION: Subjective report: Glasses. Just received a new prescription to increase bifocal strength Baseline vision: Macular Degeneration Visual history: macular degeneration   VISION ASSESSMENT: To be further assessed in functional context  Left sided Awareness      PERCEPTION: Limited left sided awareness  TODAY'S TREATMENT:   Therapeutic Exercise:   Pt. tolerated PROM in all joint ranges of the left shoulder following moist heat. Shoulder flexion and abduction at the tabletop surface. Pt. worked on [employer], and reciprocal motion using the UBE while seated for 8 min. with minimal resistance. Pt. performed left shoulder flexion, and abduction at the tabletop surface following moist heat to the left shoulder 2/2 pain, and stiffness.   Neuromuscular re-education:    Pt. performed [facility] tasks using the Grooved pegboard. Pt. worked on grasping the grooved  pegs from a horizontal position, and moving the pegs to a vertical position in the hand to prepare for placing them in the grooved slot.     Manual Therapy:   Pt. tolerated scapular mobilizations in elevation, depression, abduction/rotation 2/2 tightness. Manual therapy was performed independent of, and in preparation for ROM.       PATIENT EDUCATION: Education details: BUE strengthening and flexibility exercises Person educated: Patient Education method: verbal cues Education comprehension: verbalized and demonstrated understanding; further training needed.    HOME EXERCISE PROGRAM:  Reviewed home activities to enhance left hand digit extension during typing skills.   GOALS: Goals reviewed with patient? Yes     SHORT TERM GOALS: Target date: 04/21/2023    Pt. Will improve FOTO score by 2 points to reflect improved pt. perceived functional performance Baseline: 03/10/2023: 59 01/13/2023: 49 12/01/2022: FOTO: 51 10/23/2022: FOTO 49 10/09/2022: FOTO 49, FOTO: 61 TR score: 62  Goal status: INITIAL   LONG TERM GOALS: Target date: 06/02/2023    Pt. Will improve left shoulder ROM by 10 degrees to be able able to efficiently apply deodorant Baseline: 03/10/2023: Shoulder flexion: 35(85) 01/13/2023:  Shoulder flexion: 35(82), Abduction: 47(70) 12/02/2022: Shoulder flexion: 32(82), Abduction: 45(60)  10/23/2022: Shoulder flexion: 26(80), Abduction: 32(50) 10/09/2022: flexion: 0(55), Abduction: 32(54) Goal status: Improving, but very limited.   2.  Pt. will be able to independently hold and use a blow dryer, and brush for hair care. Baseline: 03/10/2023: Pt. continues to be able to hold the hair dryer in the left hand, however is unable to reach up to dry it, and has difficulty using a brush 01/13/2023: Pt. Can hold the hair dryer in the left hand, however is unable to reach up to dry it, and has difficulty using a brush. 12/02/2022: Pt. is unable to actively elevate he left shoulder in order to blow dry  her hair. 10/23/2022: Pt. continues to be unable to hold a blow dryer. Eval: Pt. Is unable to sustain her BUEs in elevation, and use a blow dryer, and brush. 10/09/2022: Pt. Is unable to hold a blow dryer. Eval: Pt. Is unable to sustain her BUEs in elevation, and use a blow dryer, and brush. Goal status: Ongoing   3.  Pt. Will improve left lateral pinch strength by 3# to be able to independently cut meat. Baseline: 03/10/2023: Left lateral pinch strength: 9#   01/13/2023: Left lateral pinch strength: 9#  12/02/2022: Left pinch: 7# Pt. has difficulty cutting meat. 10/23/2022: Pt. Continues to present with difficulty cutting meat. 10/09/2022: 1#  Eval: 2#. Pt. has difficulty stabilizing utensil, and food while cutting food. Goal status: Ongoing   4.  Pt. Will improve Left hand [facility] skills to be able to be able to independently, and efficiently manipulate small objects during ADL tasks. Baseline 03/10/2023: 54 sec. 01/13/2023: 51 sec. 12/02/2022: TBD  10/23/2022: Pt. Continues to present with difficulty manipulating small objects. 10/09/2022: Left: 1 min & 35 sec.: Eval:  Left: 52 sec. Right: 27 sec. Goal status: Ongoing     6.: Pt. will improve with left grip strength to be able to hold and pull up covers. Baseline: 03/10/2023: left  grip strength: 15# 01/13/2023: left  grip strength: 15# 12/02/2022: 17# 10/23/2022: Left: 16# 10/09/2022: Left: 10# Eval:  Left grip strength 11#. Pt. has difficulty holding a full drink  securely with the left hand.             Goal status: Ongoing   7. Pt. will improve typing speed, and accuracy in preparation for efficiently typing simple email message. Baseline: 03/10/2023: Pt. continues to present with limited speed, and accuracy with typing 01/13/2023: TBD 12/02/2022:  Pt. continues to present with limited speed, and accuracy with typing.10/23/2022: Pt. continues to present with limited speed, and accuracy with typing. 10/09/2022: Pt. presents with difficulty typing. Pt. presents with  difficulty typing an email. Typing speed, and accuracy TBD             Goal status: INITIAL                                        CLINICAL IMPRESSION:  Pt. Reports that she has been sick this week, and missed on Tuesday.  Pt. Reports being scheduled for an MRI of her left shoulder today, and therapy. Pt. continues to report 0/10  pain in the left shoulder today, however presents with tightness, and limited ROM. Pt. continues to present with limited left shoulder flexion, and abduction.  Pt. continues to tolerate the exercises, moist heat, manual therapy, and FMC tasks well with reports of decreased stiffness following treatment. Pt. continues to present with significantly decreased left shoulder ROM. Pt. continues to present with difficulty using the left hand during hair care, applying deodorant, cutting meat, pulling up covers, and typing an email message. Pt. continues to benefit from OT services to work towards improving Left UE functioning in order to maximize engagement in, and overall independence with ADLs, and IADL tasks.     PERFORMANCE DEFICITS in functional skills including ADLs, IADLs, coordination, dexterity, sensation, ROM, strength, FMC, decreased knowledge of use of DME, vision, and UE functional use, cognitive skills including attention, and psychosocial skills including coping strategies, environmental adaptation, habits, and routines and behaviors.    IMPAIRMENTS are limiting patient from ADLs, IADLs, and social participation.    COMORBIDITIES may have co-morbidities  that affects occupational performance. Patient will benefit from skilled OT to address above impairments and improve overall function.   MODIFICATION OR ASSISTANCE TO COMPLETE EVALUATION: Min-Moderate modification of tasks or assist with assess necessary to complete an evaluation.   OT OCCUPATIONAL PROFILE AND HISTORY: Detailed assessment: Review of records and additional review of physical, cognitive, psychosocial  history related to current functional performance.   CLINICAL DECISION MAKING: Moderate - several treatment options, min-mod task modification necessary   REHAB POTENTIAL: Good   EVALUATION COMPLEXITY: Moderate      PLAN: OT FREQUENCY: 2x/week   OT DURATION: 12 weeks   PLANNED INTERVENTIONS: self care/ADL training, therapeutic exercise, therapeutic activity, neuromuscular re-education, manual therapy, passive range of motion, and paraffin   RECOMMENDED OTHER SERVICES: ST, and PT   CONSULTED AND AGREED WITH PLAN OF CARE: Patient   PLAN FOR NEXT SESSION:  Left hand strengthening and coordination exercises   Olegario Messier, MS, OTR/L  04/02/2023

## 2023-04-07 ENCOUNTER — Ambulatory Visit: Payer: Medicare HMO | Admitting: Occupational Therapy

## 2023-04-07 ENCOUNTER — Ambulatory Visit: Payer: Medicare HMO | Admitting: Physical Therapy

## 2023-04-07 DIAGNOSIS — M6281 Muscle weakness (generalized): Secondary | ICD-10-CM | POA: Diagnosis not present

## 2023-04-07 NOTE — Therapy (Addendum)
Occupational Therapy Neuro Treatment Note   Patient Name: Alyssa Mayo MRN: [id number] DOB:February 28, 1946, 77 y.o., female Today's Date: 03/14/2022   PCP: [name], MD REFERRING PROVIDER: [name], NP     OT End of Session - 04/07/23 1353     Visit Number 47    Number of Visits 72    Date for OT Re-Evaluation 06/02/23    OT Start Time 1015   OT Stop Time 1100   OT Time Calculation (min) 45 min    Activity Tolerance Patient tolerated treatment well    Behavior During Therapy [facility] for tasks assessed/performed                                           Past Medical History:  Diagnosis Date   Abnormal levels of other serum enzymes 07/17/2015   Arthritis     Constipation 07/11/2015   COVID-19 03/06/2021   Diabetes mellitus without complication (HCC)     Elevated liver enzymes     Fatty liver     Generalized abdominal pain 07/11/2015   Hyperlipidemia     Hypertension     Lifelong obesity     Macular degeneration     Morbid obesity due to excess calories (HCC) 07/11/2015   Other fatigue 07/11/2015   Sleep apnea     Spinal headache           Past Surgical History:  Procedure Laterality Date   ABDOMINAL HYSTERECTOMY       APPENDECTOMY       CATARACT EXTRACTION       CERVICAL LAMINECTOMY   07/21/1985   CESAREAN SECTION       COLONOSCOPY   07/21/2012    diverticulosis, [facility] Dr. [name]    COLONOSCOPY WITH PROPOFOL N/A 02/04/2019    Procedure: COLONOSCOPY WITH PROPOFOL;  Surgeon: [name], MD;  Location: [facility] ENDOSCOPY;  Service: Endoscopy;  Laterality: N/A;   COLONOSCOPY WITH PROPOFOL N/A 03/18/2021    Procedure: COLONOSCOPY WITH PROPOFOL;  Surgeon: [name], MD;  Location: [facility] ENDOSCOPY;  Service: Endoscopy;  Laterality: N/A;  DM   DIAGNOSTIC LAPAROSCOPY       ESOPHAGOGASTRODUODENOSCOPY (EGD) WITH PROPOFOL N/A 02/04/2019    Procedure: ESOPHAGOGASTRODUODENOSCOPY (EGD) WITH PROPOFOL;  Surgeon: [name], MD;   Location: [facility] ENDOSCOPY;  Service: Endoscopy;  Laterality: N/A;   ESOPHAGOGASTRODUODENOSCOPY (EGD) WITH PROPOFOL N/A 03/18/2021    Procedure: ESOPHAGOGASTRODUODENOSCOPY (EGD) WITH PROPOFOL;  Surgeon: [name], MD;  Location: [facility] ENDOSCOPY;  Service: Endoscopy;  Laterality: N/A;   LOOP RECORDER INSERTION N/A 08/05/2021    Procedure: LOOP RECORDER INSERTION;  Surgeon: [name], MD;  Location: [facility] INVASIVE CV LAB;  Service: Cardiovascular;  Laterality: N/A;   SPINE SURGERY       TUBAL LIGATION            Patient Active Problem List    Diagnosis Date Noted   Paroxysmal atrial fibrillation (HCC) 08/08/2022   Hypercoagulable state due to paroxysmal atrial fibrillation (HCC) 08/08/2022   Cerebral edema (HCC) 08/07/2021   OSA (obstructive sleep apnea) 08/07/2021   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   CVA (cerebral vascular accident) (HCC) 08/01/2021   GERD (gastroesophageal reflux disease) 07/20/2019   Advanced care planning/counseling discussion 12/17/2016   Skin lesions, generalized 11/13/2016   Chronic right hip pain 06/17/2016   Vitamin D deficiency 09/26/2015  Diastasis recti 08/08/2015   Elevated serum GGT level 07/24/2015   Essential hypertension 07/17/2015   Fatty liver 07/17/2015   Elevated alkaline phosphatase level 07/17/2015   Elevated serum glutamic pyruvic transaminase (SGPT) level 07/17/2015   Abnormal finding on EKG 07/11/2015   Morbid obesity (HCC) 07/11/2015   Colon, diverticulosis 07/11/2015   Abdominal wall hernia 07/11/2015   DM (diabetes mellitus), type 2 with neurological complications (HCC)     Hyperlipidemia      ONSET DATE: 08/01/2021   REFERRING DIAG: CVA   THERAPY DIAG:  Muscle weakness (generalized)   Rationale for Evaluation and Treatment Rehabilitation   SUBJECTIVE:    SUBJECTIVE STATEMENT:    Pt. reports that she, and her family have been sick this week.  Pt accompanied by: self   PERTINENT HISTORY:  Pt. is  a 77 y.o. female who had a unexpectedly hospitalized from 2/12-2/14/2024 for COVID-19, and a UTI.  Pt. 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 Pt. attended inpatient rehab from 08/07/2021-08/22/2021. Pt. received Home health therapy services this past spring.  Pt. has recently had an assessment through driver rehabilitation services at [facility] with recommendations for referrals for  outpatient OT/PT, and ST services. Pt. PMHx includes: HTN, Hyperlipidemia, Macular degeneration, DM, and Obesity.   PRECAUTIONS: None   WEIGHT BEARING RESTRICTIONS No   PAIN:  Are you having pain?  0/10    FALLS: Has patient fallen in last 6 months? Yes.   LIVING ENVIRONMENT: Lives with: lives with their family  Son  [name] Lives in: House/apartment Main living are on one floor Stairs:  2 steps to enter Has following equipment at home: Single point cane, Wheelchair (manual), shower chair, Grab bars, bed rail, and rubber mat.   PLOF: Independent   PATIENT GOALS: To be able to Drive, and cook again   OBJECTIVE:    HAND DOMINANCE: Right    ADLs:  Transfers/ambulation related to ADLs: Independent Eating: Independent Grooming: [employer] Pt. reports being unable to use her left hand to perform haircare.  UB Dressing: Independent pullover shirt, independent now with fastening a bra LB Dressing: Independent donning pants socks, and slide on shoes Toileting: Independent Bathing: Independent Tub Shower transfers: Walk-in shower Independent now    IADLs: Shopping: Needs to be accompanied to the grocery store. Light housekeeping: Do  Meal Prep:  Is able to perform light meal prep Community mobility: Relies on son, and friend Medication management: Son sets up Mining engineer management:TBD Handwriting: TBD   MOBILITY STATUS: Hx of falls out of bed     ACTIVITY TOLERANCE: Activity tolerance:  10-20 min. Before rest break   FUNCTIONAL OUTCOME  MEASURES: FOTO: 61  TR score: 62  10/09/2022: FOTO: 49   UPPER EXTREMITY ROM      Active ROM Right eval Left eval Left  10/09/2022 Left 10/23/22 Left 12/02/2022 Left 01/13/2023 Left  03/10/2023  Shoulder flexion WFL 54(74) scaption 0(55) 26(80) 32(82) 35(82) 35(85)  Shoulder abduction WFL 58(75) 32(54) 32(50) 45(60) 47(70) 62(72)  Shoulder adduction           Shoulder extension           Shoulder internal rotation           Shoulder external rotation           Elbow flexion [facility] WFL 145 145 145 145 145  Elbow extension [facility] WFL 10(0) 10(0) WFL WFL WFL  Wrist flexion WFL TBD [facility] WFL  Wrist  extension WFL TBD [facility] WFL  Wrist ulnar deviation           Wrist radial deviation           Wrist pronation           Wrist supination           (Blank rows = not tested)     UPPER EXTREMITY MMT:      MMT Right eval Left eval Left 10/09/2022 Left 10/23/2022 Left 12/02/2022 Left 01/13/2023 Left 03/10/2023  Shoulder flexion 4+/5 2+/5 1/5 2-/5 2-/5 2-/5 2-/5  Shoulder abduction 4+/5 2+/5 2-/5 2-/5 2-/5 2-/5 2-/5  Shoulder adduction           Shoulder extension           Shoulder internal rotation           Shoulder external rotation           Middle trapezius           Lower trapezius           Elbow flexion 4+/5 3+/5 3/5 3/5 3+/5 3+/5 4/5  Elbow extension 4+/5 3+/5 3/5 3/5 3+/5 3+/5 4/5  Wrist flexion 4+/5 TBD 3/5 3/5 3+/5 3+/5 4/5  Wrist extension 4+/5 TBD 3/5 3/5 3+/5 3+/5 4/5  Wrist ulnar deviation           Wrist radial deviation           Wrist pronation           Wrist supination           (Blank rows = not tested)   HAND FUNCTION: Grip strength: Right: 33 lbs; Left: 11 lbs, Lateral pinch: Right: 17 lbs, Left: 2 lbs, and 3 point pinch: Right: 17 lbs, Left: 2 lbs  10/09/2022: Grip strength: Right: 33 lbs; Left: 10 lbs, Lateral pinch: Right: 17 lbs, Left: 1 lbs, and 3 point pinch: Right: 17 lbs, Left: 1 lbs   10/23/2022: Grip strength: Right: 33 lbs; Left:  16 lbs, Lateral pinch: Right: 17 lbs, Left: 1 lbs, and 3 point pinch: Right: 17 lbs, Left: 1 lbs  12/02/2022: Grip strength: Right: 33 lbs; Left: 17 lbs, Lateral pinch: Right: 17 lbs, Left: 7 lbs, and 3 point pinch: Right: 17 lbs, Left: 3 lbs   01/13/2023: Grip strength: Right: 33 lbs; Left: 15 lbs, Lateral pinch: Right: 17 lbs, Left: 9 lbs, and 3 point pinch: Right: 17 lbs, Left: 7 lbs  03/10/2023: Grip strength: Right: 33 lbs; Left: 15 lbs, Lateral pinch: Right: 17 lbs, Left: 9 lbs, and 3 point pinch: Right: 17 lbs, Left: 7 lbs     COORDINATION:   9 Hole Peg test: Right: 27 sec.  sec; Left: 52 sec.  10/09/2022: 9 Hole Peg test: Right: 27 sec.  sec; Left: 1 min. & 35 sec.  01/13/2023: 9 Hole Peg test: Right: 27 sec.  sec; Left: 51. Sec.  03/10/2023: 9 Hole Peg test: Right: 27 sec.  sec; Left: 54 Sec.   SENSATION: Light touch: WFL Proprioception: WFL   COGNITION: Overall cognitive status: Within functional limits for tasks assessed   VISION: Subjective report: Glasses. Just received a new prescription to increase bifocal strength Baseline vision: Macular Degeneration Visual history: macular degeneration   VISION ASSESSMENT: To be further assessed in functional context  Left sided Awareness      PERCEPTION: Limited left sided awareness     TODAY'S TREATMENT:   Therapeutic Exercise:   Pt. tolerated  PROM in all joint ranges of the left shoulder following moist heat. Shoulder flexion and abduction at the tabletop surface. Pt. worked on [employer], and reciprocal motion using the UBE while seated for 8 min. with minimal resistance. Pt. performed left shoulder flexion, and abduction at the tabletop surface following moist heat to the left shoulder 2/2 weakness, limited ROM, and stiffness.     Manual Therapy:   Pt. tolerated scapular mobilizations in elevation, depression, abduction/rotation 2/2 tightness following moist heat. Manual therapy was performed independent of, and  in preparation for ROM.       PATIENT EDUCATION: Education details: BUE strengthening and flexibility exercises Person educated: Patient Education method: verbal cues Education comprehension: verbalized and demonstrated understanding; further training needed.    HOME EXERCISE PROGRAM:  Reviewed home activities to enhance left hand digit extension during typing skills.   GOALS: Goals reviewed with patient? Yes     SHORT TERM GOALS: Target date: 04/21/2023    Pt. Will improve FOTO score by 2 points to reflect improved pt. perceived functional performance Baseline: 03/10/2023: 59 01/13/2023: 49 12/01/2022: FOTO: 51 10/23/2022: FOTO 49 10/09/2022: FOTO 49, FOTO: 61 TR score: 62  Goal status: INITIAL   LONG TERM GOALS: Target date: 06/02/2023    Pt. Will improve left shoulder ROM by 10 degrees to be able able to efficiently apply deodorant Baseline: 03/10/2023: Shoulder flexion: 35(85) 01/13/2023:  Shoulder flexion: 35(82), Abduction: 47(70) 12/02/2022: Shoulder flexion: 32(82), Abduction: 45(60)  10/23/2022: Shoulder flexion: 26(80), Abduction: 32(50) 10/09/2022: flexion: 0(55), Abduction: 32(54) Goal status: Improving, but very limited.   2.  Pt. will be able to independently hold and use a blow dryer, and brush for hair care. Baseline: 03/10/2023: Pt. continues to be able to hold the hair dryer in the left hand, however is unable to reach up to dry it, and has difficulty using a brush 01/13/2023: Pt. Can hold the hair dryer in the left hand, however is unable to reach up to dry it, and has difficulty using a brush. 12/02/2022: Pt. is unable to actively elevate he left shoulder in order to blow dry her hair. 10/23/2022: Pt. continues to be unable to hold a blow dryer. Eval: Pt. Is unable to sustain her BUEs in elevation, and use a blow dryer, and brush. 10/09/2022: Pt. Is unable to hold a blow dryer. Eval: Pt. Is unable to sustain her BUEs in elevation, and use a blow dryer, and brush. Goal status:  Ongoing   3.  Pt. Will improve left lateral pinch strength by 3# to be able to independently cut meat. Baseline: 03/10/2023: Left lateral pinch strength: 9#   01/13/2023: Left lateral pinch strength: 9#  12/02/2022: Left pinch: 7# Pt. has difficulty cutting meat. 10/23/2022: Pt. Continues to present with difficulty cutting meat. 10/09/2022: 1#  Eval: 2#. Pt. has difficulty stabilizing utensil, and food while cutting food. Goal status: Ongoing   4.  Pt. Will improve Left hand [facility] skills to be able to be able to independently, and efficiently manipulate small objects during ADL tasks. Baseline 03/10/2023: 54 sec. 01/13/2023: 51 sec. 12/02/2022: TBD  10/23/2022: Pt. Continues to present with difficulty manipulating small objects. 10/09/2022: Left: 1 min & 35 sec.: Eval: Left: 52 sec. Right: 27 sec. Goal status: Ongoing     6.: Pt. will improve with left grip strength to be able to hold and pull up covers. Baseline: 03/10/2023: left  grip strength: 15# 01/13/2023: left  grip strength: 15# 12/02/2022: 17# 10/23/2022: Left: 16# 10/09/2022: Left: 10#  Eval:  Left grip strength 11#. Pt. has difficulty holding a full drink  securely with the left hand.             Goal status: Ongoing   7. Pt. will improve typing speed, and accuracy in preparation for efficiently typing simple email message. Baseline: 03/10/2023: Pt. continues to present with limited speed, and accuracy with typing 01/13/2023: TBD 12/02/2022:  Pt. continues to present with limited speed, and accuracy with typing.10/23/2022: Pt. continues to present with limited speed, and accuracy with typing. 10/09/2022: Pt. presents with difficulty typing. Pt. presents with difficulty typing an email. Typing speed, and accuracy TBD             Goal status: INITIAL                                        CLINICAL IMPRESSION:  Pt. reports that she is feeling much better. Pt. reports 0/10 pain in the left shoulder today, however presents with tightness, and limited ROM. Pt.  continues to present with limited left shoulder flexion, and abduction. Pt. continues to tolerate the exercises, moist heat, manual therapy, and FMC tasks well with reports of decreased stiffness following treatment. Pt. continues to present with significantly decreased left shoulder ROM. Pt. continues to present with difficulty using the left hand during hair care, applying deodorant, cutting meat, pulling up covers, and typing an email message. Pt. continues to benefit from OT services to work towards improving Left UE functioning in order to maximize engagement in, and overall independence with ADLs, and IADL tasks.     PERFORMANCE DEFICITS in functional skills including ADLs, IADLs, coordination, dexterity, sensation, ROM, strength, FMC, decreased knowledge of use of DME, vision, and UE functional use, cognitive skills including attention, and psychosocial skills including coping strategies, environmental adaptation, habits, and routines and behaviors.    IMPAIRMENTS are limiting patient from ADLs, IADLs, and social participation.    COMORBIDITIES may have co-morbidities  that affects occupational performance. Patient will benefit from skilled OT to address above impairments and improve overall function.   MODIFICATION OR ASSISTANCE TO COMPLETE EVALUATION: Min-Moderate modification of tasks or assist with assess necessary to complete an evaluation.   OT OCCUPATIONAL PROFILE AND HISTORY: Detailed assessment: Review of records and additional review of physical, cognitive, psychosocial history related to current functional performance.   CLINICAL DECISION MAKING: Moderate - several treatment options, min-mod task modification necessary   REHAB POTENTIAL: Good   EVALUATION COMPLEXITY: Moderate      PLAN: OT FREQUENCY: 2x/week   OT DURATION: 12 weeks   PLANNED INTERVENTIONS: self care/ADL training, therapeutic exercise, therapeutic activity, neuromuscular re-education, manual therapy, passive  range of motion, and paraffin   RECOMMENDED OTHER SERVICES: ST, and PT   CONSULTED AND AGREED WITH PLAN OF CARE: Patient   PLAN FOR NEXT SESSION:  Left hand strengthening and coordination exercises   Olegario Messier, MS, OTR/L  04/07/2023

## 2023-04-09 ENCOUNTER — Ambulatory Visit: Payer: Medicare HMO | Admitting: Physical Therapy

## 2023-04-09 ENCOUNTER — Ambulatory Visit: Payer: Medicare HMO | Admitting: Occupational Therapy

## 2023-04-13 ENCOUNTER — Ambulatory Visit (INDEPENDENT_AMBULATORY_CARE_PROVIDER_SITE_OTHER): Payer: Medicare HMO

## 2023-04-13 DIAGNOSIS — I639 Cerebral infarction, unspecified: Secondary | ICD-10-CM | POA: Diagnosis not present

## 2023-04-13 LAB — CUP PACEART REMOTE DEVICE CHECK
Date Time Interrogation Session: 20240920230514
Implantable Pulse Generator Implant Date: 20230116

## 2023-04-14 ENCOUNTER — Ambulatory Visit: Payer: Medicare HMO | Admitting: Physical Therapy

## 2023-04-14 ENCOUNTER — Ambulatory Visit: Payer: Medicare HMO | Admitting: Occupational Therapy

## 2023-04-14 DIAGNOSIS — M6281 Muscle weakness (generalized): Secondary | ICD-10-CM | POA: Diagnosis not present

## 2023-04-14 DIAGNOSIS — R278 Other lack of coordination: Secondary | ICD-10-CM

## 2023-04-15 NOTE — Therapy (Signed)
Occupational Therapy Neuro Treatment Note   Patient Name: Alyssa Mayo MRN: [id number] DOB:01/13/1946, 77 y.o., female Today's Date: 03/14/2022   PCP: [name], MD REFERRING PROVIDER: [name], NP     OT End of Session - 04/07/23 1353     Visit Number 47    Number of Visits 72    Date for OT Re-Evaluation 06/02/23    OT Start Time 1015   OT Stop Time 1100   OT Time Calculation (min) 45 min    Activity Tolerance Patient tolerated treatment well    Behavior During Therapy [facility] for tasks assessed/performed                                           Past Medical History:  Diagnosis Date   Abnormal levels of other serum enzymes 07/17/2015   Arthritis     Constipation 07/11/2015   COVID-19 03/06/2021   Diabetes mellitus without complication (HCC)     Elevated liver enzymes     Fatty liver     Generalized abdominal pain 07/11/2015   Hyperlipidemia     Hypertension     Lifelong obesity     Macular degeneration     Morbid obesity due to excess calories (HCC) 07/11/2015   Other fatigue 07/11/2015   Sleep apnea     Spinal headache           Past Surgical History:  Procedure Laterality Date   ABDOMINAL HYSTERECTOMY       APPENDECTOMY       CATARACT EXTRACTION       CERVICAL LAMINECTOMY   07/21/1985   CESAREAN SECTION       COLONOSCOPY   07/21/2012    diverticulosis, [facility] Dr. [name]    COLONOSCOPY WITH PROPOFOL N/A 02/04/2019    Procedure: COLONOSCOPY WITH PROPOFOL;  Surgeon: [name], MD;  Location: [facility] ENDOSCOPY;  Service: Endoscopy;  Laterality: N/A;   COLONOSCOPY WITH PROPOFOL N/A 03/18/2021    Procedure: COLONOSCOPY WITH PROPOFOL;  Surgeon: [name], MD;  Location: [facility] ENDOSCOPY;  Service: Endoscopy;  Laterality: N/A;  DM   DIAGNOSTIC LAPAROSCOPY       ESOPHAGOGASTRODUODENOSCOPY (EGD) WITH PROPOFOL N/A 02/04/2019    Procedure: ESOPHAGOGASTRODUODENOSCOPY (EGD) WITH PROPOFOL;  Surgeon: [name], MD;   Location: [facility] ENDOSCOPY;  Service: Endoscopy;  Laterality: N/A;   ESOPHAGOGASTRODUODENOSCOPY (EGD) WITH PROPOFOL N/A 03/18/2021    Procedure: ESOPHAGOGASTRODUODENOSCOPY (EGD) WITH PROPOFOL;  Surgeon: [name], MD;  Location: [facility] ENDOSCOPY;  Service: Endoscopy;  Laterality: N/A;   LOOP RECORDER INSERTION N/A 08/05/2021    Procedure: LOOP RECORDER INSERTION;  Surgeon: [name], MD;  Location: [facility] INVASIVE CV LAB;  Service: Cardiovascular;  Laterality: N/A;   SPINE SURGERY       TUBAL LIGATION            Patient Active Problem List    Diagnosis Date Noted   Paroxysmal atrial fibrillation (HCC) 08/08/2022   Hypercoagulable state due to paroxysmal atrial fibrillation (HCC) 08/08/2022   Cerebral edema (HCC) 08/07/2021   OSA (obstructive sleep apnea) 08/07/2021   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   CVA (cerebral vascular accident) (HCC) 08/01/2021   GERD (gastroesophageal reflux disease) 07/20/2019   Advanced care planning/counseling discussion 12/17/2016   Skin lesions, generalized 11/13/2016   Chronic right hip pain 06/17/2016   Vitamin D deficiency 09/26/2015  Diastasis recti 08/08/2015   Elevated serum GGT level 07/24/2015   Essential hypertension 07/17/2015   Fatty liver 07/17/2015   Elevated alkaline phosphatase level 07/17/2015   Elevated serum glutamic pyruvic transaminase (SGPT) level 07/17/2015   Abnormal finding on EKG 07/11/2015   Morbid obesity (HCC) 07/11/2015   Colon, diverticulosis 07/11/2015   Abdominal wall hernia 07/11/2015   DM (diabetes mellitus), type 2 with neurological complications (HCC)     Hyperlipidemia      ONSET DATE: 08/01/2021   REFERRING DIAG: CVA   THERAPY DIAG:  Muscle weakness (generalized)   Rationale for Evaluation and Treatment Rehabilitation   SUBJECTIVE:    SUBJECTIVE STATEMENT:    Pt. reports that  she is planning to travel to the beach with friends from college the week of October 6th.  Pt  accompanied by: self   PERTINENT HISTORY:  Pt. is a 77 y.o. female who had a unexpectedly hospitalized from 2/12-2/14/2024 for COVID-19, and a UTI.  Pt. 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 Pt. attended inpatient rehab from 08/07/2021-08/22/2021. Pt. received Home health therapy services this past spring.  Pt. has recently had an assessment through driver rehabilitation services at [facility] with recommendations for referrals for  outpatient OT/PT, and ST services. Pt. PMHx includes: HTN, Hyperlipidemia, Macular degeneration, DM, and Obesity.   PRECAUTIONS: None   WEIGHT BEARING RESTRICTIONS No   PAIN:  Are you having pain?  0/10    FALLS: Has patient fallen in last 6 months? Yes.   LIVING ENVIRONMENT: Lives with: lives with their family  Son  [name] Lives in: House/apartment Main living are on one floor Stairs:  2 steps to enter Has following equipment at home: Single point cane, Wheelchair (manual), shower chair, Grab bars, bed rail, and rubber mat.   PLOF: Independent   PATIENT GOALS: To be able to Drive, and cook again   OBJECTIVE:    HAND DOMINANCE: Right    ADLs:  Transfers/ambulation related to ADLs: Independent Eating: Independent Grooming: [employer] Pt. reports being unable to use her left hand to perform haircare.  UB Dressing: Independent pullover shirt, independent now with fastening a bra LB Dressing: Independent donning pants socks, and slide on shoes Toileting: Independent Bathing: Independent Tub Shower transfers: Walk-in shower Independent now    IADLs: Shopping: Needs to be accompanied to the grocery store. Light housekeeping: Do  Meal Prep:  Is able to perform light meal prep Community mobility: Relies on son, and friend Medication management: Son sets up Mining engineer management:TBD Handwriting: TBD   MOBILITY STATUS: Hx of falls out of bed     ACTIVITY TOLERANCE: Activity tolerance:  10-20  min. Before rest break   FUNCTIONAL OUTCOME MEASURES: FOTO: 61  TR score: 62  10/09/2022: FOTO: 49   UPPER EXTREMITY ROM      Active ROM Right eval Left eval Left  10/09/2022 Left 10/23/22 Left 12/02/2022 Left 01/13/2023 Left  03/10/2023  Shoulder flexion WFL 54(74) scaption 0(55) 26(80) 32(82) 35(82) 35(85)  Shoulder abduction WFL 58(75) 32(54) 32(50) 45(60) 47(70) 62(72)  Shoulder adduction           Shoulder extension           Shoulder internal rotation           Shoulder external rotation           Elbow flexion [facility] WFL 145 145 145 145 145  Elbow extension [facility] WFL 10(0) 10(0) WFL WFL WFL  Wrist flexion  WFL TBD [facility] WFL  Wrist extension WFL TBD [facility] WFL  Wrist ulnar deviation           Wrist radial deviation           Wrist pronation           Wrist supination           (Blank rows = not tested)     UPPER EXTREMITY MMT:      MMT Right eval Left eval Left 10/09/2022 Left 10/23/2022 Left 12/02/2022 Left 01/13/2023 Left 03/10/2023  Shoulder flexion 4+/5 2+/5 1/5 2-/5 2-/5 2-/5 2-/5  Shoulder abduction 4+/5 2+/5 2-/5 2-/5 2-/5 2-/5 2-/5  Shoulder adduction           Shoulder extension           Shoulder internal rotation           Shoulder external rotation           Middle trapezius           Lower trapezius           Elbow flexion 4+/5 3+/5 3/5 3/5 3+/5 3+/5 4/5  Elbow extension 4+/5 3+/5 3/5 3/5 3+/5 3+/5 4/5  Wrist flexion 4+/5 TBD 3/5 3/5 3+/5 3+/5 4/5  Wrist extension 4+/5 TBD 3/5 3/5 3+/5 3+/5 4/5  Wrist ulnar deviation           Wrist radial deviation           Wrist pronation           Wrist supination           (Blank rows = not tested)   HAND FUNCTION: Grip strength: Right: 33 lbs; Left: 11 lbs, Lateral pinch: Right: 17 lbs, Left: 2 lbs, and 3 point pinch: Right: 17 lbs, Left: 2 lbs  10/09/2022: Grip strength: Right: 33 lbs; Left: 10 lbs, Lateral pinch: Right: 17 lbs, Left: 1 lbs, and 3 point pinch: Right: 17 lbs, Left: 1 lbs    10/23/2022: Grip strength: Right: 33 lbs; Left: 16 lbs, Lateral pinch: Right: 17 lbs, Left: 1 lbs, and 3 point pinch: Right: 17 lbs, Left: 1 lbs  12/02/2022: Grip strength: Right: 33 lbs; Left: 17 lbs, Lateral pinch: Right: 17 lbs, Left: 7 lbs, and 3 point pinch: Right: 17 lbs, Left: 3 lbs   01/13/2023: Grip strength: Right: 33 lbs; Left: 15 lbs, Lateral pinch: Right: 17 lbs, Left: 9 lbs, and 3 point pinch: Right: 17 lbs, Left: 7 lbs  03/10/2023: Grip strength: Right: 33 lbs; Left: 15 lbs, Lateral pinch: Right: 17 lbs, Left: 9 lbs, and 3 point pinch: Right: 17 lbs, Left: 7 lbs     COORDINATION:   9 Hole Peg test: Right: 27 sec.  sec; Left: 52 sec.  10/09/2022: 9 Hole Peg test: Right: 27 sec.  sec; Left: 1 min. & 35 sec.  01/13/2023: 9 Hole Peg test: Right: 27 sec.  sec; Left: 51. Sec.  03/10/2023: 9 Hole Peg test: Right: 27 sec.  sec; Left: 54 Sec.   SENSATION: Light touch: WFL Proprioception: WFL   COGNITION: Overall cognitive status: Within functional limits for tasks assessed   VISION: Subjective report: Glasses. Just received a new prescription to increase bifocal strength Baseline vision: Macular Degeneration Visual history: macular degeneration   VISION ASSESSMENT: To be further assessed in functional context  Left sided Awareness      PERCEPTION: Limited left sided awareness     TODAY'S  TREATMENT:   Therapeutic Exercise:   Pt. tolerated PROM in all joint ranges of the left shoulder following moist heat. Shoulder flexion and abduction at the tabletop surface.  Pt. performed left shoulder flexion, and abduction at the tabletop surface following moist heat to the left shoulder 2/2 weakness, limited ROM, and stiffness.     Manual Therapy:   Pt. tolerated scapular mobilizations in elevation, depression, abduction/rotation 2/2 tightness following moist heat. Manual therapy was performed independent of, and in preparation for ROM.   Neuromuscular re-education:  Pt.  performed left hand FMC tasks using the Grooved pegboard. Pt. worked on grasping the grooved pegs from a horizontal position, and moving the pegs to a vertical position in the hand to prepare for placing them in the grooved slot.          PATIENT EDUCATION: Education details: BUE strengthening and flexibility exercises Person educated: Patient Education method: verbal cues Education comprehension: verbalized and demonstrated understanding; further training needed.    HOME EXERCISE PROGRAM:  Reviewed home activities to enhance left hand digit extension during typing skills.   GOALS: Goals reviewed with patient? Yes     SHORT TERM GOALS: Target date: 04/21/2023    Pt. Will improve FOTO score by 2 points to reflect improved pt. perceived functional performance Baseline: 03/10/2023: 59 01/13/2023: 49 12/01/2022: FOTO: 51 10/23/2022: FOTO 49 10/09/2022: FOTO 49, FOTO: 61 TR score: 62  Goal status: INITIAL   LONG TERM GOALS: Target date: 06/02/2023    Pt. Will improve left shoulder ROM by 10 degrees to be able able to efficiently apply deodorant Baseline: 03/10/2023: Shoulder flexion: 35(85) 01/13/2023:  Shoulder flexion: 35(82), Abduction: 47(70) 12/02/2022: Shoulder flexion: 32(82), Abduction: 45(60)  10/23/2022: Shoulder flexion: 26(80), Abduction: 32(50) 10/09/2022: flexion: 0(55), Abduction: 32(54) Goal status: Improving, but very limited.   2.  Pt. will be able to independently hold and use a blow dryer, and brush for hair care. Baseline: 03/10/2023: Pt. continues to be able to hold the hair dryer in the left hand, however is unable to reach up to dry it, and has difficulty using a brush 01/13/2023: Pt. Can hold the hair dryer in the left hand, however is unable to reach up to dry it, and has difficulty using a brush. 12/02/2022: Pt. is unable to actively elevate he left shoulder in order to blow dry her hair. 10/23/2022: Pt. continues to be unable to hold a blow dryer. Eval: Pt. Is unable to  sustain her BUEs in elevation, and use a blow dryer, and brush. 10/09/2022: Pt. Is unable to hold a blow dryer. Eval: Pt. Is unable to sustain her BUEs in elevation, and use a blow dryer, and brush. Goal status: Ongoing   3.  Pt. Will improve left lateral pinch strength by 3# to be able to independently cut meat. Baseline: 03/10/2023: Left lateral pinch strength: 9#   01/13/2023: Left lateral pinch strength: 9#  12/02/2022: Left pinch: 7# Pt. has difficulty cutting meat. 10/23/2022: Pt. Continues to present with difficulty cutting meat. 10/09/2022: 1#  Eval: 2#. Pt. has difficulty stabilizing utensil, and food while cutting food. Goal status: Ongoing   4.  Pt. Will improve Left hand [facility] skills to be able to be able to independently, and efficiently manipulate small objects during ADL tasks. Baseline 03/10/2023: 54 sec. 01/13/2023: 51 sec. 12/02/2022: TBD  10/23/2022: Pt. Continues to present with difficulty manipulating small objects. 10/09/2022: Left: 1 min & 35 sec.: Eval: Left: 52 sec. Right: 27 sec. Goal status: Ongoing  6.Marland Kitchen Pt. will improve with left grip strength to be able to hold and pull up covers. Baseline: 03/10/2023: left  grip strength: 15# 01/13/2023: left  grip strength: 15# 12/02/2022: 17# 10/23/2022: Left: 16# 10/09/2022: Left: 10# Eval:  Left grip strength 11#. Pt. has difficulty holding a full drink  securely with the left hand.             Goal status: Ongoing   7. Pt. will improve typing speed, and accuracy in preparation for efficiently typing simple email message. Baseline: 03/10/2023: Pt. continues to present with limited speed, and accuracy with typing 01/13/2023: TBD 12/02/2022:  Pt. continues to present with limited speed, and accuracy with typing.10/23/2022: Pt. continues to present with limited speed, and accuracy with typing. 10/09/2022: Pt. presents with difficulty typing. Pt. presents with difficulty typing an email. Typing speed, and accuracy TBD             Goal status: INITIAL                                         CLINICAL IMPRESSION:  Pt. reports that she is feeling much better. Pt. reports 0/10 pain in the left shoulder today, however  continues to present with tightness, and limited ROM. Pt. continues to present with limited left shoulder flexion, and abduction. Pt. continues to tolerate the exercises, moist heat, manual therapy, and FMC tasks well with reports of decreased stiffness following treatment. Pt. continues to present with significantly decreased left shoulder ROM. Pt. was able to manipulate the 1" grooved pegs, however dropped multiple pegs from her fingers when attempting translatory movements. Pt. continues to present with difficulty using the left hand during hair care, applying deodorant, cutting meat, pulling up covers, and typing an email message. Pt. continues to benefit from OT services to work towards improving Left UE functioning in order to maximize engagement in, and overall independence with ADLs, and IADL tasks.     PERFORMANCE DEFICITS in functional skills including ADLs, IADLs, coordination, dexterity, sensation, ROM, strength, FMC, decreased knowledge of use of DME, vision, and UE functional use, cognitive skills including attention, and psychosocial skills including coping strategies, environmental adaptation, habits, and routines and behaviors.    IMPAIRMENTS are limiting patient from ADLs, IADLs, and social participation.    COMORBIDITIES may have co-morbidities  that affects occupational performance. Patient will benefit from skilled OT to address above impairments and improve overall function.   MODIFICATION OR ASSISTANCE TO COMPLETE EVALUATION: Min-Moderate modification of tasks or assist with assess necessary to complete an evaluation.   OT OCCUPATIONAL PROFILE AND HISTORY: Detailed assessment: Review of records and additional review of physical, cognitive, psychosocial history related to current functional performance.   CLINICAL  DECISION MAKING: Moderate - several treatment options, min-mod task modification necessary   REHAB POTENTIAL: Good   EVALUATION COMPLEXITY: Moderate      PLAN: OT FREQUENCY: 2x/week   OT DURATION: 12 weeks   PLANNED INTERVENTIONS: self care/ADL training, therapeutic exercise, therapeutic activity, neuromuscular re-education, manual therapy, passive range of motion, and paraffin   RECOMMENDED OTHER SERVICES: ST, and PT   CONSULTED AND AGREED WITH PLAN OF CARE: Patient   PLAN FOR NEXT SESSION:  Left hand strengthening and coordination exercises   Olegario Messier, MS, OTR/L  04/15/2023

## 2023-04-16 ENCOUNTER — Ambulatory Visit: Payer: Medicare HMO | Admitting: Physical Therapy

## 2023-04-16 ENCOUNTER — Ambulatory Visit: Payer: Medicare HMO | Admitting: Occupational Therapy

## 2023-04-16 DIAGNOSIS — M6281 Muscle weakness (generalized): Secondary | ICD-10-CM | POA: Diagnosis not present

## 2023-04-16 NOTE — Therapy (Signed)
Occupational Therapy Neuro Treatment Note   Patient Name: Geraldin Dzialo MRN: [id number] DOB:12-06-1945, 77 y.o., female Today's Date: 03/14/2022   PCP: [name], MD REFERRING PROVIDER: [name], NP     OT End of Session - 04/16/23 1341     Visit Number 49    Number of Visits 72    Date for OT Re-Evaluation 06/02/23    OT Start Time 1015    OT Stop Time 1100    OT Time Calculation (min) 45 min    Activity Tolerance Patient tolerated treatment well    Behavior During Therapy [facility] for tasks assessed/performed                                             Past Medical History:  Diagnosis Date   Abnormal levels of other serum enzymes 07/17/2015   Arthritis     Constipation 07/11/2015   COVID-19 03/06/2021   Diabetes mellitus without complication (HCC)     Elevated liver enzymes     Fatty liver     Generalized abdominal pain 07/11/2015   Hyperlipidemia     Hypertension     Lifelong obesity     Macular degeneration     Morbid obesity due to excess calories (HCC) 07/11/2015   Other fatigue 07/11/2015   Sleep apnea     Spinal headache           Past Surgical History:  Procedure Laterality Date   ABDOMINAL HYSTERECTOMY       APPENDECTOMY       CATARACT EXTRACTION       CERVICAL LAMINECTOMY   07/21/1985   CESAREAN SECTION       COLONOSCOPY   07/21/2012    diverticulosis, [facility] Dr. [name]    COLONOSCOPY WITH PROPOFOL N/A 02/04/2019    Procedure: COLONOSCOPY WITH PROPOFOL;  Surgeon: [name], MD;  Location: [facility] ENDOSCOPY;  Service: Endoscopy;  Laterality: N/A;   COLONOSCOPY WITH PROPOFOL N/A 03/18/2021    Procedure: COLONOSCOPY WITH PROPOFOL;  Surgeon: [name], MD;  Location: [facility] ENDOSCOPY;  Service: Endoscopy;  Laterality: N/A;  DM   DIAGNOSTIC LAPAROSCOPY       ESOPHAGOGASTRODUODENOSCOPY (EGD) WITH PROPOFOL N/A 02/04/2019    Procedure: ESOPHAGOGASTRODUODENOSCOPY (EGD) WITH PROPOFOL;  Surgeon: [name], MD;   Location[facility] ENDOSCOPY;  Service: Endoscopy;  Laterality: N/A;   ESOPHAGOGASTRODUODENOSCOPY (EGD) WITH PROPOFOL N/A 03/18/2021    Procedure: ESOPHAGOGASTRODUODENOSCOPY (EGD) WITH PROPOFOL;  Surgeon: [name], MD;  Location: [facility] ENDOSCOPY;  Service: Endoscopy;  Laterality: N/A;   LOOP RECORDER INSERTION N/A 08/05/2021    Procedure: LOOP RECORDER INSERTION;  Surgeon: [name], MD;  Location: [facility] INVASIVE CV LAB;  Service: Cardiovascular;  Laterality: N/A;   SPINE SURGERY       TUBAL LIGATION            Patient Active Problem List    Diagnosis Date Noted   Paroxysmal atrial fibrillation (HCC) 08/08/2022   Hypercoagulable state due to paroxysmal atrial fibrillation (HCC) 08/08/2022   Cerebral edema (HCC) 08/07/2021   OSA (obstructive sleep apnea) 08/07/2021   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   CVA (cerebral vascular accident) (HCC) 08/01/2021   GERD (gastroesophageal reflux disease) 07/20/2019   Advanced care planning/counseling discussion 12/17/2016   Skin lesions, generalized 11/13/2016   Chronic right hip pain 06/17/2016  Vitamin D deficiency 09/26/2015   Diastasis recti 08/08/2015   Elevated serum GGT level 07/24/2015   Essential hypertension 07/17/2015   Fatty liver 07/17/2015   Elevated alkaline phosphatase level 07/17/2015   Elevated serum glutamic pyruvic transaminase (SGPT) level 07/17/2015   Abnormal finding on EKG 07/11/2015   Morbid obesity (HCC) 07/11/2015   Colon, diverticulosis 07/11/2015   Abdominal wall hernia 07/11/2015   DM (diabetes mellitus), type 2 with neurological complications (HCC)     Hyperlipidemia      ONSET DATE: 08/01/2021   REFERRING DIAG: CVA   THERAPY DIAG:  Muscle weakness (generalized)   Rationale for Evaluation and Treatment Rehabilitation   SUBJECTIVE:    SUBJECTIVE STATEMENT:    Pt. reports that  she is planning to travel to the beach with friends from college the week of October 6th.  Pt  accompanied by: self   PERTINENT HISTORY:  Pt. is a 77 y.o. female who had a unexpectedly hospitalized from 2/12-2/14/2024 for COVID-19, and a UTI.  Pt. 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 Pt. attended inpatient rehab from 08/07/2021-08/22/2021. Pt. received Home health therapy services this past spring.  Pt. has recently had an assessment through driver rehabilitation services at [facility] with recommendations for referrals for  outpatient OT/PT, and ST services. Pt. PMHx includes: HTN, Hyperlipidemia, Macular degeneration, DM, and Obesity.   PRECAUTIONS: None   WEIGHT BEARING RESTRICTIONS No   PAIN:  Are you having pain?  0/10    FALLS: Has patient fallen in last 6 months? Yes.   LIVING ENVIRONMENT: Lives with: lives with their family  Son  [name] Lives in: House/apartment Main living are on one floor Stairs:  2 steps to enter Has following equipment at home: Single point cane, Wheelchair (manual), shower chair, Grab bars, bed rail, and rubber mat.   PLOF: Independent   PATIENT GOALS: To be able to Drive, and cook again   OBJECTIVE:    HAND DOMINANCE: Right    ADLs:  Transfers/ambulation related to ADLs: Independent Eating: Independent Grooming: [employer] Pt. reports being unable to use her left hand to perform haircare.  UB Dressing: Independent pullover shirt, independent now with fastening a bra LB Dressing: Independent donning pants socks, and slide on shoes Toileting: Independent Bathing: Independent Tub Shower transfers: Walk-in shower Independent now    IADLs: Shopping: Needs to be accompanied to the grocery store. Light housekeeping: Do  Meal Prep:  Is able to perform light meal prep Community mobility: Relies on son, and friend Medication management: Son sets up Mining engineer management:TBD Handwriting: TBD   MOBILITY STATUS: Hx of falls out of bed     ACTIVITY TOLERANCE: Activity tolerance:  10-20  min. Before rest break   FUNCTIONAL OUTCOME MEASURES: FOTO: 61  TR score: 62  10/09/2022: FOTO: 49   UPPER EXTREMITY ROM      Active ROM Right eval Left eval Left  10/09/2022 Left 10/23/22 Left 12/02/2022 Left 01/13/2023 Left  03/10/2023  Shoulder flexion WFL 54(74) scaption 0(55) 26(80) 32(82) 35(82) 35(85)  Shoulder abduction WFL 58(75) 32(54) 32(50) 45(60) 47(70) 62(72)  Shoulder adduction           Shoulder extension           Shoulder internal rotation           Shoulder external rotation           Elbow flexion [facility] WFL 145 145 145 145 145  Elbow extension WFL WFL 10(0) 10(0)  [facility] WFL  Wrist flexion WFL TBD [facility] WFL  Wrist extension WFL TBD [facility] WFL WFL  Wrist ulnar deviation           Wrist radial deviation           Wrist pronation           Wrist supination           (Blank rows = not tested)     UPPER EXTREMITY MMT:      MMT Right eval Left eval Left 10/09/2022 Left 10/23/2022 Left 12/02/2022 Left 01/13/2023 Left 03/10/2023  Shoulder flexion 4+/5 2+/5 1/5 2-/5 2-/5 2-/5 2-/5  Shoulder abduction 4+/5 2+/5 2-/5 2-/5 2-/5 2-/5 2-/5  Shoulder adduction           Shoulder extension           Shoulder internal rotation           Shoulder external rotation           Middle trapezius           Lower trapezius           Elbow flexion 4+/5 3+/5 3/5 3/5 3+/5 3+/5 4/5  Elbow extension 4+/5 3+/5 3/5 3/5 3+/5 3+/5 4/5  Wrist flexion 4+/5 TBD 3/5 3/5 3+/5 3+/5 4/5  Wrist extension 4+/5 TBD 3/5 3/5 3+/5 3+/5 4/5  Wrist ulnar deviation           Wrist radial deviation           Wrist pronation           Wrist supination           (Blank rows = not tested)   HAND FUNCTION: Grip strength: Right: 33 lbs; Left: 11 lbs, Lateral pinch: Right: 17 lbs, Left: 2 lbs, and 3 point pinch: Right: 17 lbs, Left: 2 lbs  10/09/2022: Grip strength: Right: 33 lbs; Left: 10 lbs, Lateral pinch: Right: 17 lbs, Left: 1 lbs, and 3 point pinch: Right: 17 lbs, Left: 1 lbs    10/23/2022: Grip strength: Right: 33 lbs; Left: 16 lbs, Lateral pinch: Right: 17 lbs, Left: 1 lbs, and 3 point pinch: Right: 17 lbs, Left: 1 lbs  12/02/2022: Grip strength: Right: 33 lbs; Left: 17 lbs, Lateral pinch: Right: 17 lbs, Left: 7 lbs, and 3 point pinch: Right: 17 lbs, Left: 3 lbs   01/13/2023: Grip strength: Right: 33 lbs; Left: 15 lbs, Lateral pinch: Right: 17 lbs, Left: 9 lbs, and 3 point pinch: Right: 17 lbs, Left: 7 lbs  03/10/2023: Grip strength: Right: 33 lbs; Left: 15 lbs, Lateral pinch: Right: 17 lbs, Left: 9 lbs, and 3 point pinch: Right: 17 lbs, Left: 7 lbs     COORDINATION:   9 Hole Peg test: Right: 27 sec.  sec; Left: 52 sec.  10/09/2022: 9 Hole Peg test: Right: 27 sec.  sec; Left: 1 min. & 35 sec.  01/13/2023: 9 Hole Peg test: Right: 27 sec.  sec; Left: 51. Sec.  03/10/2023: 9 Hole Peg test: Right: 27 sec.  sec; Left: 54 Sec.   SENSATION: Light touch: WFL Proprioception: WFL   COGNITION: Overall cognitive status: Within functional limits for tasks assessed   VISION: Subjective report: Glasses. Just received a new prescription to increase bifocal strength Baseline vision: Macular Degeneration Visual history: macular degeneration   VISION ASSESSMENT: To be further assessed in functional context  Left sided Awareness      PERCEPTION: Limited left sided  awareness     TODAY'S TREATMENT:   Therapeutic Exercise:   Pt. tolerated PROM in all joint ranges of the left shoulder following moist heat. Shoulder flexion and abduction at the tabletop surface.  Pt. performed left shoulder flexion, and abduction at the tabletop surface following moist heat to the left shoulder 2/2 weakness, limited ROM, and stiffness. Pt. Worked on pinch strengthening in the left hand for lateral, and 3pt. pinch using yellow, red, green, and blue resistive clips, and placing them onto a small dowel. Tactile and verbal cues were required for eliciting the desired movement.    Manual  Therapy:   Pt. tolerated scapular mobilizations in elevation, depression, abduction/rotation 2/2 tightness following moist heat. Manual therapy was performed independent of, and in preparation for ROM.       PATIENT EDUCATION: Education details: BUE strengthening and flexibility exercises Person educated: Patient Education method: verbal cues Education comprehension: verbalized and demonstrated understanding; further training needed.    HOME EXERCISE PROGRAM:  Reviewed home activities to enhance left hand digit extension during typing skills.   GOALS: Goals reviewed with patient? Yes     SHORT TERM GOALS: Target date: 04/21/2023    Pt. Will improve FOTO score by 2 points to reflect improved pt. perceived functional performance Baseline: 03/10/2023: 59 01/13/2023: 49 12/01/2022: FOTO: 51 10/23/2022: FOTO 49 10/09/2022: FOTO 49, FOTO: 61 TR score: 62  Goal status: INITIAL   LONG TERM GOALS: Target date: 06/02/2023    Pt. Will improve left shoulder ROM by 10 degrees to be able able to efficiently apply deodorant Baseline: 03/10/2023: Shoulder flexion: 35(85) 01/13/2023:  Shoulder flexion: 35(82), Abduction: 47(70) 12/02/2022: Shoulder flexion: 32(82), Abduction: 45(60)  10/23/2022: Shoulder flexion: 26(80), Abduction: 32(50) 10/09/2022: flexion: 0(55), Abduction: 32(54) Goal status: Improving, but very limited.   2.  Pt. will be able to independently hold and use a blow dryer, and brush for hair care. Baseline: 03/10/2023: Pt. continues to be able to hold the hair dryer in the left hand, however is unable to reach up to dry it, and has difficulty using a brush 01/13/2023: Pt. Can hold the hair dryer in the left hand, however is unable to reach up to dry it, and has difficulty using a brush. 12/02/2022: Pt. is unable to actively elevate he left shoulder in order to blow dry her hair. 10/23/2022: Pt. continues to be unable to hold a blow dryer. Eval: Pt. Is unable to sustain her BUEs in elevation, and  use a blow dryer, and brush. 10/09/2022: Pt. Is unable to hold a blow dryer. Eval: Pt. Is unable to sustain her BUEs in elevation, and use a blow dryer, and brush. Goal status: Ongoing   3.  Pt. Will improve left lateral pinch strength by 3# to be able to independently cut meat. Baseline: 03/10/2023: Left lateral pinch strength: 9#   01/13/2023: Left lateral pinch strength: 9#  12/02/2022: Left pinch: 7# Pt. has difficulty cutting meat. 10/23/2022: Pt. Continues to present with difficulty cutting meat. 10/09/2022: 1#  Eval: 2#. Pt. has difficulty stabilizing utensil, and food while cutting food. Goal status: Ongoing   4.  Pt. Will improve Left hand [facility] skills to be able to be able to independently, and efficiently manipulate small objects during ADL tasks. Baseline 03/10/2023: 54 sec. 01/13/2023: 51 sec. 12/02/2022: TBD  10/23/2022: Pt. Continues to present with difficulty manipulating small objects. 10/09/2022: Left: 1 min & 35 sec.: Eval: Left: 52 sec. Right: 27 sec. Goal status: Ongoing     6.: Pt.  will improve with left grip strength to be able to hold and pull up covers. Baseline: 03/10/2023: left  grip strength: 15# 01/13/2023: left  grip strength: 15# 12/02/2022: 17# 10/23/2022: Left: 16# 10/09/2022: Left: 10# Eval:  Left grip strength 11#. Pt. has difficulty holding a full drink  securely with the left hand.             Goal status: Ongoing   7. Pt. will improve typing speed, and accuracy in preparation for efficiently typing simple email message. Baseline: 03/10/2023: Pt. continues to present with limited speed, and accuracy with typing 01/13/2023: TBD 12/02/2022:  Pt. continues to present with limited speed, and accuracy with typing.10/23/2022: Pt. continues to present with limited speed, and accuracy with typing. 10/09/2022: Pt. presents with difficulty typing. Pt. presents with difficulty typing an email. Typing speed, and accuracy TBD             Goal status: INITIAL                                         CLINICAL IMPRESSION:  Pt. has an appointment with an orthopedic physician to follow-up from the MRI for her left shoulder next Thursday. Pt. Continues to present with limited UE ROM. Pt. Required cues for hand/digit position 3pt., and lateral pinch on the clips. Pt. continues to present with difficulty using the left hand during hair care, applying deodorant, cutting meat, pulling up covers, and typing an email message. Pt. continues to benefit from OT services to work towards improving Left UE functioning in order to maximize engagement in, and overall independence with ADLs, and IADL tasks.     PERFORMANCE DEFICITS in functional skills including ADLs, IADLs, coordination, dexterity, sensation, ROM, strength, FMC, decreased knowledge of use of DME, vision, and UE functional use, cognitive skills including attention, and psychosocial skills including coping strategies, environmental adaptation, habits, and routines and behaviors.    IMPAIRMENTS are limiting patient from ADLs, IADLs, and social participation.    COMORBIDITIES may have co-morbidities  that affects occupational performance. Patient will benefit from skilled OT to address above impairments and improve overall function.   MODIFICATION OR ASSISTANCE TO COMPLETE EVALUATION: Min-Moderate modification of tasks or assist with assess necessary to complete an evaluation.   OT OCCUPATIONAL PROFILE AND HISTORY: Detailed assessment: Review of records and additional review of physical, cognitive, psychosocial history related to current functional performance.   CLINICAL DECISION MAKING: Moderate - several treatment options, min-mod task modification necessary   REHAB POTENTIAL: Good   EVALUATION COMPLEXITY: Moderate      PLAN: OT FREQUENCY: 2x/week   OT DURATION: 12 weeks   PLANNED INTERVENTIONS: self care/ADL training, therapeutic exercise, therapeutic activity, neuromuscular re-education, manual therapy, passive range of motion,  and paraffin   RECOMMENDED OTHER SERVICES: ST, and PT   CONSULTED AND AGREED WITH PLAN OF CARE: Patient   PLAN FOR NEXT SESSION:  Left hand strengthening and coordination exercises   Olegario Messier, MS, OTR/L  04/15/2023

## 2023-04-17 ENCOUNTER — Encounter: Payer: Self-pay | Admitting: Physical Medicine and Rehabilitation

## 2023-04-17 ENCOUNTER — Encounter: Payer: Medicare HMO | Attending: Physical Medicine and Rehabilitation | Admitting: Physical Medicine and Rehabilitation

## 2023-04-17 VITALS — BP 121/78 | HR 83 | Ht 63.0 in | Wt 180.0 lb

## 2023-04-17 DIAGNOSIS — G629 Polyneuropathy, unspecified: Secondary | ICD-10-CM | POA: Insufficient documentation

## 2023-04-17 DIAGNOSIS — M25512 Pain in left shoulder: Secondary | ICD-10-CM | POA: Diagnosis present

## 2023-04-17 DIAGNOSIS — G8929 Other chronic pain: Secondary | ICD-10-CM | POA: Insufficient documentation

## 2023-04-17 DIAGNOSIS — E669 Obesity, unspecified: Secondary | ICD-10-CM | POA: Diagnosis present

## 2023-04-17 MED ORDER — CAPSAICIN-CLEANSING GEL 8 % EX KIT
4.0000 | PACK | Freq: Once | CUTANEOUS | Status: AC
Start: 2023-04-17 — End: ?

## 2023-04-17 NOTE — Progress Notes (Signed)
Subjective:    Patient ID: Alyssa Mayo, female    DOB: 11/26/1945, 77 y.o.   MRN: [id number]  HPI Alyssa Mayo is a 77 year old woman who presents for f/u of CVA and peripheral neuropathy 1) CVA -she has been able to walk much better -walked from parking lot to here without an assistive device -does not walk every day -she is following with Dr. Burnett Sheng. -walking pretty well without cane -goes to PT 2-3 times per week   2) HTN -BP is currently 127/70 -continues to take amlodipine  3) Diabetic peripheral neuropathy -had complete relief from qutenza, would like to repeat today -her neuropathy is worst at night, when it can sometimes prevent her from sleeping -she takes gabapentin  4) Left shoulder rotator cuff tear: -planning to get surgery   Pain Inventory Average Pain 0 Pain Right Now 0 My pain is intermittent, sharp, and aching  LOCATION OF PAIN  headache, back pain  BOWEL Number of stools per week: 3-4   BLADDER Normal and Pads   Mobility use a cane how many minutes can you walk? 10-15 minutes ability to climb steps?  yes do you drive?  no Do you have any goals in this area?  yes - Function retired I need assistance with the following:  dressing, bathing, meal prep, household duties, and shopping Do you have any goals in this area?  yes  Neuro/Psych weakness trouble walking  Prior Studies Any changes since last visit?  no  Physicians involved in your care Any changes since last visit?  no   Family History  Problem Relation Age of Onset   Cancer Mother        breast   Heart disease Mother    Stroke Mother    Diabetes Mother    Colon cancer Mother    Cancer Father        leukemia   Stroke Father    Leukemia Father    Ovarian cancer Sister    Diabetes Brother    Cancer Brother    Stroke Maternal Grandfather    Dementia Paternal Grandmother    COPD Neg Hx    Hypertension Neg Hx    Social History   Socioeconomic History    Marital status: Widowed    Spouse name: Not on file   Number of children: Not on file   Years of education: Not on file   Highest education level: Bachelor's degree (e.g., BA, AB, BS)  Occupational History   Not on file  Tobacco Use   Smoking status: Never   Smokeless tobacco: Never   Tobacco comments:    Never smoke 08/08/22  Vaping Use   Vaping status: Never Used  Substance and Sexual Activity   Alcohol use: Not Currently    Comment: none last 24 months   Drug use: No   Sexual activity: Not on file  Other Topics Concern   Not on file  Social History Narrative   Not on file   Social Determinants of Health   Financial Resource Strain: Low Risk  (12/17/2022)   Received from [facility], [facility]   Overall Financial Resource Strain (CARDIA)    Difficulty of Paying Living Expenses: Not hard at all  Food Insecurity: No Food Insecurity (12/17/2022)   Received from [facility], [facility]   Hunger Vital Sign    Worried About Running Out of Food in the Last Year: Never true  Ran Out of Food in the Last Year: Never true  Transportation Needs: No Transportation Needs (12/17/2022)   Received from [facility], [facility]   [facility] - Transportation    In the past 12 months, has lack of transportation kept you from medical appointments or from getting medications?: No    Lack of Transportation (Non-Medical): No  Physical Activity: Inactive (12/10/2017)   Exercise Vital Sign    Days of Exercise per Week: 0 days    Minutes of Exercise per Session: 0 min  Stress: No Stress Concern Present (12/10/2017)   [employer] of Occupational [employer] - Occupational Stress Questionnaire    Feeling of Stress : Not at all  Social Connections: Socially Integrated (12/10/2017)   Social Connection and Isolation Panel [NHANES]    Frequency of Communication with Friends and Family: More than  three times a week    Frequency of Social Gatherings with Friends and Family: More than three times a week    Attends Religious Services: More than 4 times per year    Active Member of Clubs or Organizations: Yes    Attends Engineer, structural: More than 4 times per year    Marital Status: Married   Past Surgical History:  Procedure Laterality Date   ABDOMINAL HYSTERECTOMY     APPENDECTOMY     CATARACT EXTRACTION     CERVICAL LAMINECTOMY  07/21/1985   CESAREAN SECTION     COLONOSCOPY  07/21/2012   diverticulosis, [facility] Dr. [name]    COLONOSCOPY WITH PROPOFOL N/A 02/04/2019   Procedure: COLONOSCOPY WITH PROPOFOL;  Surgeon: [name], MD;  Location: [facility] ENDOSCOPY;  Service: Endoscopy;  Laterality: N/A;   COLONOSCOPY WITH PROPOFOL N/A 03/18/2021   Procedure: COLONOSCOPY WITH PROPOFOL;  Surgeon: [name], MD;  Location: [facility] ENDOSCOPY;  Service: Endoscopy;  Laterality: N/A;  DM   DIAGNOSTIC LAPAROSCOPY     ESOPHAGOGASTRODUODENOSCOPY (EGD) WITH PROPOFOL N/A 02/04/2019   Procedure: ESOPHAGOGASTRODUODENOSCOPY (EGD) WITH PROPOFOL;  Surgeon: [name], MD;  Location: [facility] ENDOSCOPY;  Service: Endoscopy;  Laterality: N/A;   ESOPHAGOGASTRODUODENOSCOPY (EGD) WITH PROPOFOL N/A 03/18/2021   Procedure: ESOPHAGOGASTRODUODENOSCOPY (EGD) WITH PROPOFOL;  Surgeon: [name], MD;  Location: [facility] ENDOSCOPY;  Service: Endoscopy;  Laterality: N/A;   LOOP RECORDER INSERTION N/A 08/05/2021   Procedure: LOOP RECORDER INSERTION;  Surgeon: [name], MD;  Location: [facility] INVASIVE CV LAB;  Service: Cardiovascular;  Laterality: N/A;   SPINE SURGERY     TUBAL LIGATION     Past Medical History:  Diagnosis Date   Abnormal levels of other serum enzymes 07/17/2015   Arthritis    Constipation 07/11/2015   COVID-19 03/06/2021   Diabetes mellitus without complication (HCC)    Elevated liver enzymes    Fatty liver    Generalized abdominal pain 07/11/2015    Hyperlipidemia    Hypertension    Lifelong obesity    Macular degeneration    Morbid obesity due to excess calories (HCC) 07/11/2015   Other fatigue 07/11/2015   Sleep apnea    Spinal headache    BP 121/78   Pulse 83   Ht 5\' 3"  (1.6 m)   Wt 180 lb (81.6 kg)   SpO2 93%   BMI 31.89 kg/m   Opioid Risk Score:   Fall Risk Score:  `1  Depression screen [facility] 2/9     04/17/2023   10:31 AM 09/16/2021   11:25 AM 08/16/2020   10:15 AM 11/23/2019  10:25 AM 12/16/2018   10:47 AM 12/10/2017    1:09 PM 08/31/2017    9:33 AM  Depression screen PHQ 2/9  Decreased Interest 0 0 0 0 0 0 0  Down, Depressed, Hopeless 0 0 0 0 0 0 0  PHQ - 2 Score 0 0 0 0 0 0 0  Altered sleeping  0       Tired, decreased energy  3       Change in appetite  3       Feeling bad or failure about yourself   0       Trouble concentrating  0       Moving slowly or fidgety/restless  0       Suicidal thoughts  0       PHQ-9 Score  6         Review of Systems  Musculoskeletal:  Positive for back pain and gait problem.  Neurological:  Positive for weakness (left hand) and headaches.  All other systems reviewed and are negative.      Objective:   Physical Exam Gen: no distress, normal appearing, BMI 31.89, BP 121/78, weight 180 lbs HEENT: oral mucosa pink and moist, NCAT Cardio: Reg rate Chest: normal effort, normal rate of breathing Abd: soft, non-distended Ext: no edema Psych: pleasant, normal affect, flat Skin: intact, no open lesions on feet, +onchomychosis of toe nails Neuro: Alert and oriented x3     Assessment & Plan:   1) CVA -continue therapies -would benefit from handicap placard to increase mobility in the community -reviewed all medications and provided necessary refills.  2) Diabetic peripheral neuropathy -Discussed Qutenza as an option for neuropathic pain control. Discussed that this is a capsaicin patch, stronger than capsaicin cream. Discussed that it is currently approved for diabetic  peripheral neuropathy and post-herpetic neuralgia, but that it has also shown benefit in treating other forms of neuropathy. Provided patient with link to site to learn more about the patch: https://www.clark.biz/. Discussed that the patch would be placed in office and benefits usually last 3 months. Discussed that unintended exposure to capsaicin can cause severe irritation of eyes, mucous membranes, respiratory tract, and skin, but that Qutenza is a local treatment and does not have the systemic side effects of other nerve medications. Discussed that there may be pain, itching, erythema, and decreased sensory function associated with the application of Qutenza. Side effects usually subside within 1 week. A cold pack of analgesic medications can help with these side effects. Blood pressure can also be increased due to pain associated with administration of the patch.   4 patches of Qutenza was applied to the area of pain. Ice packs were applied during the procedure to ensure patient comfort. Blood pressure was monitored every 15 minutes. The patient tolerated the procedure well. Post-procedure instructions were given and follow-up has been scheduled.    -decrease gabapentin to 100mg  HS  -Discussed improvement in HgbA1c to 5.4! -continue metformin -continue gabapentin -discussed that CBGs have been under 90s -discussed continuing to avoid rice and potatoes   3) Onchomycosis: mycostatin ordered.  4) HTN: -discussed stopping amlodipine if SBP consistently in 120s -discussed BP has been well controlled -Advised checking BP daily at home and logging results to bring into follow-up appointment with PCP and myself. -Reviewed BP meds today.  -Advised regarding healthy foods that can help lower blood pressure and provided with a list: 1) citrus foods- high in vitamins and minerals 2) salmon and other fatty fish -  reduces inflammation and oxylipins 3) swiss chard (leafy green)- high level of  nitrates 4) pumpkin seeds- one of the best natural sources of magnesium 5) Beans and lentils- high in fiber, magnesium, and potassium 6) Berries- high in flavonoids 7) Amaranth (whole grain, can be cooked similarly to rice and oats)- high in magnesium and fiber 8) Pistachios- even more effective at reducing BP than other nuts 9) Carrots- high in phenolic compounds that relax blood vessels and reduce inflammation 10) Celery- contain phthalides that relax tissues of arterial walls 11) Tomatoes- can also improve cholesterol and reduce risk of heart disease 12) Broccoli- good source of magnesium, calcium, and potassium 13) Greek yogurt: high in potassium and calcium 14) Herbs and spices: Celery seed, cilantro, saffron, lemongrass, black cumin, ginseng, cinnamon, cardamom, sweet basil, and ginger 15) Chia and flax seeds- also help to lower cholesterol and blood sugar 16) Beets- high levels of nitrates that relax blood vessels  17) spinach and bananas- high in potassium  -Provided lise of supplements that can help with hypertension:  1) magnesium: one high quality brand is Bioptemizers since it contains all 7 types of magnesium, otherwise over the counter magnesium gluconate 400mg  is a good option 2) B vitamins 3) vitamin D 4) potassium 5) CoQ10 6) L-arginine 7) Vitamin C 8) Beetroot -Educated that goal BP is 120/80. -Made goal to incorporate some of the above foods into diet.    5) Obesity: -weight reviewed and there is tremendous improvement: BMI improved from 35 to 31.89, weight improved from 194 to 180 lbs, discussed that she has been eating a healthy diet!  6) Chronic left shoulder pain: Discussed Home Zynex NexWave Stimulator Device and supplies as needed. IFC, NMES and TENS medically necessary Treatment Rx: Daily @ 30-40 minutes per treatment PRN. Zynex NexWave only, no substitutions. Treatment Goals: 1) To reduce and/or eliminate pain 2) To improve functional capacity and  Activities of daily living 3) To reduce or prevent the need for oral medications 4) To improve circulation in the injured region 5) To decrease or prevent muscle spasm and muscle atrophy 6) To provide a self-management tool to the patient The patient has not sufficiently improved with conservative care. Numerous studies indexed by Medline and PubMed.gov have shown Neuromuscular, Interferential, and TENS stimulators to reduce pain, improve function, and reduce medication use in injured patients. Continued use of this evidence based, safe, drug free treatment is both reasonable and medically necessary at this time.

## 2023-04-21 ENCOUNTER — Ambulatory Visit: Payer: Medicare HMO | Admitting: Physical Therapy

## 2023-04-21 ENCOUNTER — Ambulatory Visit: Payer: Medicare HMO | Attending: Adult Health | Admitting: Occupational Therapy

## 2023-04-21 DIAGNOSIS — R278 Other lack of coordination: Secondary | ICD-10-CM | POA: Insufficient documentation

## 2023-04-21 DIAGNOSIS — M6281 Muscle weakness (generalized): Secondary | ICD-10-CM | POA: Insufficient documentation

## 2023-04-21 NOTE — Therapy (Signed)
Occupational Therapy Progress Note  Dates of reporting period  03/10/2023   to   04/21/2023    Patient Name: Alyssa Mayo MRN: [id number] DOB:23-Jun-1946, 77 y.o., female Today's Date: 03/14/2022   PCP: [name], MD REFERRING PROVIDER: [name], NP     OT End of Session - 04/21/23 1027     Visit Number 50    Number of Visits 72    Date for OT Re-Evaluation 06/02/23    Authorization Type Progress reporting period starting 01/13/2023    OT Start Time 1022    OT Stop Time 1100    OT Time Calculation (min) 38 min    Activity Tolerance Patient tolerated treatment well    Behavior During Therapy [facility] for tasks assessed/performed                                               Past Medical History:  Diagnosis Date   Abnormal levels of other serum enzymes 07/17/2015   Arthritis     Constipation 07/11/2015   COVID-19 03/06/2021   Diabetes mellitus without complication (HCC)     Elevated liver enzymes     Fatty liver     Generalized abdominal pain 07/11/2015   Hyperlipidemia     Hypertension     Lifelong obesity     Macular degeneration     Morbid obesity due to excess calories (HCC) 07/11/2015   Other fatigue 07/11/2015   Sleep apnea     Spinal headache           Past Surgical History:  Procedure Laterality Date   ABDOMINAL HYSTERECTOMY       APPENDECTOMY       CATARACT EXTRACTION       CERVICAL LAMINECTOMY   07/21/1985   CESAREAN SECTION       COLONOSCOPY   07/21/2012    diverticulosis, [facility] Dr. [name]    COLONOSCOPY WITH PROPOFOL N/A 02/04/2019    Procedure: COLONOSCOPY WITH PROPOFOL;  Surgeon: [name], MD;  Location: [facility] ENDOSCOPY;  Service: Endoscopy;  Laterality: N/A;   COLONOSCOPY WITH PROPOFOL N/A 03/18/2021    Procedure: COLONOSCOPY WITH PROPOFOL;  Surgeon: [name], MD;  Location: [facility] ENDOSCOPY;  Service: Endoscopy;  Laterality: N/A;  DM   DIAGNOSTIC LAPAROSCOPY       ESOPHAGOGASTRODUODENOSCOPY (EGD) WITH  PROPOFOL N/A 02/04/2019    Procedure: ESOPHAGOGASTRODUODENOSCOPY (EGD) WITH PROPOFOL;  Surgeon: [name], MD;  Location: [facility] ENDOSCOPY;  Service: Endoscopy;  Laterality: N/A;   ESOPHAGOGASTRODUODENOSCOPY (EGD) WITH PROPOFOL N/A 03/18/2021    Procedure: ESOPHAGOGASTRODUODENOSCOPY (EGD) WITH PROPOFOL;  Surgeon: [name], MD;  Location: [facility] ENDOSCOPY;  Service: Endoscopy;  Laterality: N/A;   LOOP RECORDER INSERTION N/A 08/05/2021    Procedure: LOOP RECORDER INSERTION;  Surgeon: [name], MD;  Location: [facility] INVASIVE CV LAB;  Service: Cardiovascular;  Laterality: N/A;   SPINE SURGERY       TUBAL LIGATION            Patient Active Problem List    Diagnosis Date Noted   Paroxysmal atrial fibrillation (HCC) 08/08/2022   Hypercoagulable state due to paroxysmal atrial fibrillation (HCC) 08/08/2022   Cerebral edema (HCC) 08/07/2021   OSA (obstructive sleep apnea) 08/07/2021   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   CVA (cerebral vascular accident) (HCC) 08/01/2021   GERD (gastroesophageal  reflux disease) 07/20/2019   Advanced care planning/counseling discussion 12/17/2016   Skin lesions, generalized 11/13/2016   Chronic right hip pain 06/17/2016   Vitamin D deficiency 09/26/2015   Diastasis recti 08/08/2015   Elevated serum GGT level 07/24/2015   Essential hypertension 07/17/2015   Fatty liver 07/17/2015   Elevated alkaline phosphatase level 07/17/2015   Elevated serum glutamic pyruvic transaminase (SGPT) level 07/17/2015   Abnormal finding on EKG 07/11/2015   Morbid obesity (HCC) 07/11/2015   Colon, diverticulosis 07/11/2015   Abdominal wall hernia 07/11/2015   DM (diabetes mellitus), type 2 with neurological complications (HCC)     Hyperlipidemia      ONSET DATE: 08/01/2021   REFERRING DIAG: CVA   THERAPY DIAG:  Muscle weakness (generalized)   Rationale for Evaluation and Treatment Rehabilitation   SUBJECTIVE:    SUBJECTIVE STATEMENT:     Pt. reports that  she is planning to travel to the beach with friends from college this weekend through next week.  Pt accompanied by: self   PERTINENT HISTORY:  Pt. is a 77 y.o. female who had a unexpectedly hospitalized from 2/12-2/14/2024 for COVID-19, and a UTI.  Pt. 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 Pt. attended inpatient rehab from 08/07/2021-08/22/2021. Pt. received Home health therapy services this past spring.  Pt. has recently had an assessment through driver rehabilitation services at [facility] with recommendations for referrals for  outpatient OT/PT, and ST services. Pt. PMHx includes: HTN, Hyperlipidemia, Macular degeneration, DM, and Obesity.   PRECAUTIONS: None   WEIGHT BEARING RESTRICTIONS No   PAIN:  Are you having pain?  0/10    FALLS: Has patient fallen in last 6 months? Yes.   LIVING ENVIRONMENT: Lives with: lives with their family  Son  [name] Lives in: House/apartment Main living are on one floor Stairs:  2 steps to enter Has following equipment at home: Single point cane, Wheelchair (manual), shower chair, Grab bars, bed rail, and rubber mat.   PLOF: Independent   PATIENT GOALS: To be able to Drive, and cook again   OBJECTIVE:    HAND DOMINANCE: Right    ADLs:  Transfers/ambulation related to ADLs: Independent Eating: Independent Grooming: [employer] Pt. reports being unable to use her left hand to perform haircare.  UB Dressing: Independent pullover shirt, independent now with fastening a bra LB Dressing: Independent donning pants socks, and slide on shoes Toileting: Independent Bathing: Independent Tub Shower transfers: Walk-in shower Independent now    IADLs: Shopping: Needs to be accompanied to the grocery store. Light housekeeping: Do  Meal Prep:  Is able to perform light meal prep Community mobility: Relies on son, and friend Medication management: Son sets up Mining engineer  management:TBD Handwriting: TBD   MOBILITY STATUS: Hx of falls out of bed     ACTIVITY TOLERANCE: Activity tolerance:  10-20 min. Before rest break   FUNCTIONAL OUTCOME MEASURES: FOTO: 61  TR score: 62  10/09/2022: FOTO: 49   UPPER EXTREMITY ROM      Active ROM Right eval Left eval Left  10/09/2022 Left 10/23/22 Left 12/02/2022 Left 01/13/2023 Left  03/10/2023 Left 04/21/23  Shoulder flexion WFL 54(74) scaption 0(55) 26(80) 32(82) 35(82) 35(85) 40(87)  Shoulder abduction WFL 58(75) 32(54) 32(50) 45(60) 47(70) 62(72) 48(76)  Shoulder adduction            Shoulder extension            Shoulder internal rotation  Shoulder external rotation            Elbow flexion [facility] WFL 145 145 145 145 145 147  Elbow extension [facility] WFL 10(0) 10(0) WFL WFL [facility] WFL  Wrist flexion WFL TBD [facility] WFL  Wrist extension WFL TBD [facility] WFL WFL  Wrist ulnar deviation            Wrist radial deviation            Wrist pronation            Wrist supination            (Blank rows = not tested)     UPPER EXTREMITY MMT:      MMT Right eval Left eval Left 10/09/2022 Left 10/23/2022 Left 12/02/2022 Left 01/13/2023 Left 03/10/2023 Left 04/21/23  Shoulder flexion 4+/5 2+/5 1/5 2-/5 2-/5 2-/5 2-/5 2-/5  Shoulder abduction 4+/5 2+/5 2-/5 2-/5 2-/5 2-/5 2-/5 2-/5  Shoulder adduction            Shoulder extension            Shoulder internal rotation            Shoulder external rotation            Middle trapezius            Lower trapezius            Elbow flexion 4+/5 3+/5 3/5 3/5 3+/5 3+/5 4/5 4/5  Elbow extension 4+/5 3+/5 3/5 3/5 3+/5 3+/5 4/5 4/5  Wrist flexion 4+/5 TBD 3/5 3/5 3+/5 3+/5 4/5 4/5  Wrist extension 4+/5 TBD 3/5 3/5 3+/5 3+/5 4/5 4/5  Wrist ulnar deviation            Wrist radial deviation            Wrist pronation            Wrist supination            (Blank rows = not tested)   HAND FUNCTION: Grip strength: Right: 33 lbs; Left: 11 lbs,  Lateral pinch: Right: 17 lbs, Left: 2 lbs, and 3 point pinch: Right: 17 lbs, Left: 2 lbs  10/09/2022: Grip strength: Right: 33 lbs; Left: 10 lbs, Lateral pinch: Right: 17 lbs, Left: 1 lbs, and 3 point pinch: Right: 17 lbs, Left: 1 lbs   10/23/2022: Grip strength: Right: 33 lbs; Left: 16 lbs, Lateral pinch: Right: 17 lbs, Left: 1 lbs, and 3 point pinch: Right: 17 lbs, Left: 1 lbs  12/02/2022: Grip strength: Right: 33 lbs; Left: 17 lbs, Lateral pinch: Right: 17 lbs, Left: 7 lbs, and 3 point pinch: Right: 17 lbs, Left: 3 lbs   01/13/2023: Grip strength: Right: 33 lbs; Left: 15 lbs, Lateral pinch: Right: 17 lbs, Left: 9 lbs, and 3 point pinch: Right: 17 lbs, Left: 7 lbs  03/10/2023: Grip strength: Right: 33 lbs; Left: 15 lbs, Lateral pinch: Right: 17 lbs, Left: 9 lbs, and 3 point pinch: Right: 17 lbs, Left: 7 lbs   04/21/2023: Grip strength: Right: 34 lbs; Left: 13 lbs, Lateral pinch: NT 3 point pinch: Right: NT   COORDINATION:   9 Hole Peg test: Right: 27 sec.  sec; Left: 52 sec.  10/09/2022: 9 Hole Peg test: Right: 27 sec.  sec; Left: 1 min. & 35 sec.  01/13/2023: 9 Hole Peg test: Right: 27 sec.  sec; Left: 51. Sec.  03/10/2023: 9 Hole Peg test: Right: 27  sec.  sec; Left: 54 Sec.  04/21/2023: 9 Hole Peg test: Right: 27 sec.  sec; Left: 46 Sec.  SENSATION: Light touch: WFL Proprioception: WFL   COGNITION: Overall cognitive status: Within functional limits for tasks assessed   VISION: Subjective report: Glasses. Just received a new prescription to increase bifocal strength Baseline vision: Macular Degeneration Visual history: macular degeneration   VISION ASSESSMENT: To be further assessed in functional context  Left sided Awareness      PERCEPTION: Limited left sided awareness     TODAY'S TREATMENT:   Measurement were obtained, and goals were reviewed with the Pt.   PATIENT EDUCATION: Education details: BUE strengthening and flexibility exercises Person educated:  Patient Education method: verbal cues Education comprehension: verbalized and demonstrated understanding; further training needed.    HOME EXERCISE PROGRAM:  Reviewed home activities to enhance left hand digit extension during typing skills.   GOALS: Goals reviewed with patient? Yes     SHORT TERM GOALS: Target date: 04/21/2023    Pt. Will improve FOTO score by 2 points to reflect improved pt. perceived functional performance Baseline: 04/21/2023: 49 03/10/2023: 59 01/13/2023: 49 12/01/2022: FOTO: 51 10/23/2022: FOTO 49 10/09/2022: FOTO 49, FOTO: 61 TR score: 62  Goal status: INITIAL   LONG TERM GOALS: Target date: 06/02/2023    Pt. Will improve left shoulder ROM by 10 degrees to be able able to efficiently apply deodorant Baseline:  04/21/2023: shoulder flexion: 40(87), Abduction: 48(76) 03/10/2023: Shoulder flexion: 35(85) 01/13/2023:  Shoulder flexion: 35(82), Abduction: 47(70) 12/02/2022: Shoulder flexion: 32(82), Abduction: 45(60)  10/23/2022: Shoulder flexion: 26(80), Abduction: 32(50) 10/09/2022: flexion: 0(55), Abduction: 32(54) Goal status: Improving, ongoing   2.  Pt. will be able to independently hold and use a blow dryer, and brush for hair care. Baseline: 04/21/2023: Pt. is able to hold the hair dryer in the left hand, however is unable to reach up to dry it, and has difficulty using a brush with the left UE. 03/10/2023: Pt. continues to be able to hold the hair dryer in the left hand, however is unable to reach up to dry it, and has difficulty using a brush 01/13/2023: Pt. Can hold the hair dryer in the left hand, however is unable to reach up to dry it, and has difficulty using a brush. 12/02/2022: Pt. is unable to actively elevate he left shoulder in order to blow dry her hair. 10/23/2022: Pt. continues to be unable to hold a blow dryer. Eval: Pt. Is unable to sustain her BUEs in elevation, and use a blow dryer, and brush. 10/09/2022: Pt. Is unable to hold a blow dryer. Eval: Pt. Is unable  to sustain her BUEs in elevation, and use a blow dryer, and brush. Goal status: Ongoing   3.  Pt. Will improve left lateral pinch strength by 3# to be able to independently cut meat. Baseline: 04/21/2023: NT(Pinch Meter sent out for calibration) Pt. continues to have difficulty cutting meat. 03/10/2023: Left lateral pinch strength: 9#   01/13/2023: Left lateral pinch strength: 9#  12/02/2022: Left pinch: 7# Pt. has difficulty cutting meat. 10/23/2022: Pt. Continues to present with difficulty cutting meat. 10/09/2022: 1#  Eval: 2#. Pt. has difficulty stabilizing utensil, and food while cutting food. Goal status: Ongoing   4.  Pt. Will improve Left hand [facility] skills to be able to be able to independently, and efficiently manipulate small objects during ADL tasks. Baseline 04/21/2023:  46 sec. 03/10/2023: 54 sec. 01/13/2023: 51 sec. 12/02/2022: TBD  10/23/2022: Pt. Continues to present with  difficulty manipulating small objects. 10/09/2022: Left: 1 min & 35 sec.: Eval: Left: 52 sec. Right: 27 sec. Goal status: Improving, Ongoing     6.: Pt. will improve with left grip strength to be able to hold and pull up pants Baseline: 04/21/2023: Pt. is able to pull up covers, however has difficulty pulling up pants. 03/10/2023: left  grip strength: 15# 01/13/2023: left  grip strength: 15# 12/02/2022: 17# 10/23/2022: Left: 16# 10/09/2022: Left: 10# Eval:  Left grip strength 11#. Pt. has difficulty holding a full drink  securely with the left hand.             Goal status: Improved, Revised to pants.   7. Pt. will improve typing speed, and accuracy in preparation for efficiently typing simple email message. Baseline: 04/21/2023:  Pt. continues to present with limited speed, and accuracy with typing  03/10/2023: Pt. continues to present with limited speed, and accuracy with typing 01/13/2023: TBD 12/02/2022:  Pt. continues to present with limited speed, and accuracy with typing.10/23/2022: Pt. continues to present with limited speed,  and accuracy with typing. 10/09/2022: Pt. presents with difficulty typing. Pt. presents with difficulty typing an email. Typing speed, and accuracy TBD             Goal status: Improved                                        CLINICAL IMPRESSION:  Pt. has an appointment with an orthopedic physician to follow-up from the MRI for her left shoulder on Thursday. Pt. continues to present with limited UE ROM. Pt. has made progress with left shoulder AROM, and [facility] skills. Pt. has progressed with left [facility] skills by 8 sec. Of speed, from 54 sec. to 46 sec. of speed. Pt. Left shoulder pain has improved.  FOTO score is 49. Pt. continues to present with limited Left shoulder ROM, and difficulty using the left hand during hair care, applying deodorant, cutting meat, pulling up pants, and typing an email message. Pt. continues to benefit from OT services to work towards improving Left UE functioning in order to maximize engagement in, and overall independence with ADLs, and IADL tasks.     PERFORMANCE DEFICITS in functional skills including ADLs, IADLs, coordination, dexterity, sensation, ROM, strength, FMC, decreased knowledge of use of DME, vision, and UE functional use, cognitive skills including attention, and psychosocial skills including coping strategies, environmental adaptation, habits, and routines and behaviors.    IMPAIRMENTS are limiting patient from ADLs, IADLs, and social participation.    COMORBIDITIES may have co-morbidities  that affects occupational performance. Patient will benefit from skilled OT to address above impairments and improve overall function.   MODIFICATION OR ASSISTANCE TO COMPLETE EVALUATION: Min-Moderate modification of tasks or assist with assess necessary to complete an evaluation.   OT OCCUPATIONAL PROFILE AND HISTORY: Detailed assessment: Review of records and additional review of physical, cognitive, psychosocial history related to current functional performance.    CLINICAL DECISION MAKING: Moderate - several treatment options, min-mod task modification necessary   REHAB POTENTIAL: Good   EVALUATION COMPLEXITY: Moderate      PLAN: OT FREQUENCY: 2x/week   OT DURATION: 12 weeks   PLANNED INTERVENTIONS: self care/ADL training, therapeutic exercise, therapeutic activity, neuromuscular re-education, manual therapy, passive range of motion, and paraffin   RECOMMENDED OTHER SERVICES: ST, and PT   CONSULTED AND AGREED WITH PLAN OF CARE: Patient  PLAN FOR NEXT SESSION:  Left hand strengthening and coordination exercises   Olegario Messier, MS, OTR/L  04/21/2023

## 2023-04-23 ENCOUNTER — Ambulatory Visit: Payer: Medicare HMO | Admitting: Occupational Therapy

## 2023-04-23 ENCOUNTER — Ambulatory Visit: Payer: Medicare HMO | Admitting: Physical Therapy

## 2023-04-24 NOTE — Progress Notes (Signed)
Carelink Summary Report / Loop Recorder 

## 2023-04-28 ENCOUNTER — Ambulatory Visit: Payer: Medicare HMO | Admitting: Physical Therapy

## 2023-04-28 ENCOUNTER — Ambulatory Visit: Payer: Medicare HMO | Admitting: Occupational Therapy

## 2023-04-30 ENCOUNTER — Ambulatory Visit: Payer: Medicare HMO | Admitting: Occupational Therapy

## 2023-04-30 ENCOUNTER — Ambulatory Visit: Payer: Medicare HMO | Admitting: Physical Therapy

## 2023-05-05 ENCOUNTER — Ambulatory Visit: Payer: Medicare HMO | Admitting: Occupational Therapy

## 2023-05-05 ENCOUNTER — Ambulatory Visit: Payer: Medicare HMO | Admitting: Physical Therapy

## 2023-05-05 DIAGNOSIS — R278 Other lack of coordination: Secondary | ICD-10-CM | POA: Diagnosis present

## 2023-05-05 DIAGNOSIS — M6281 Muscle weakness (generalized): Secondary | ICD-10-CM | POA: Diagnosis present

## 2023-05-05 NOTE — Therapy (Signed)
Occupational Therapy Neuro Treatment Note   Patient Name: Alyssa Mayo MRN: [id number] DOB:June 05, 1946, 77 y.o., female Today's Date: 03/14/2022   PCP: [name], MD REFERRING PROVIDER: [name], NP     OT End of Session - 05/05/23 1740     Visit Number 51    Number of Visits 72    Date for OT Re-Evaluation 06/02/23    Authorization Type Progress reporting period starting 01/13/2023    OT Start Time 1030    OT Stop Time 1100    OT Time Calculation (min) 30 min    Activity Tolerance Patient tolerated treatment well    Behavior During Therapy [facility] for tasks assessed/performed                                               Past Medical History:  Diagnosis Date   Abnormal levels of other serum enzymes 07/17/2015   Arthritis     Constipation 07/11/2015   COVID-19 03/06/2021   Diabetes mellitus without complication (HCC)     Elevated liver enzymes     Fatty liver     Generalized abdominal pain 07/11/2015   Hyperlipidemia     Hypertension     Lifelong obesity     Macular degeneration     Morbid obesity due to excess calories (HCC) 07/11/2015   Other fatigue 07/11/2015   Sleep apnea     Spinal headache           Past Surgical History:  Procedure Laterality Date   ABDOMINAL HYSTERECTOMY       APPENDECTOMY       CATARACT EXTRACTION       CERVICAL LAMINECTOMY   07/21/1985   CESAREAN SECTION       COLONOSCOPY   07/21/2012    diverticulosis, [facility] Dr. [name]    COLONOSCOPY WITH PROPOFOL N/A 02/04/2019    Procedure: COLONOSCOPY WITH PROPOFOL;  Surgeon: [name], MD;  Location: [facility] ENDOSCOPY;  Service: Endoscopy;  Laterality: N/A;   COLONOSCOPY WITH PROPOFOL N/A 03/18/2021    Procedure: COLONOSCOPY WITH PROPOFOL;  Surgeon: [name], MD;  Location: [facility] ENDOSCOPY;  Service: Endoscopy;  Laterality: N/A;  DM   DIAGNOSTIC LAPAROSCOPY       ESOPHAGOGASTRODUODENOSCOPY (EGD) WITH PROPOFOL N/A 02/04/2019    Procedure:  ESOPHAGOGASTRODUODENOSCOPY (EGD) WITH PROPOFOL;  Surgeon: [name], MD;  Location: [facility] ENDOSCOPY;  Service: Endoscopy;  Laterality: N/A;   ESOPHAGOGASTRODUODENOSCOPY (EGD) WITH PROPOFOL N/A 03/18/2021    Procedure: ESOPHAGOGASTRODUODENOSCOPY (EGD) WITH PROPOFOL;  Surgeon: [name], MD;  Location: [facility] ENDOSCOPY;  Service: Endoscopy;  Laterality: N/A;   LOOP RECORDER INSERTION N/A 08/05/2021    Procedure: LOOP RECORDER INSERTION;  Surgeon: [name], MD;  Location: [facility] INVASIVE CV LAB;  Service: Cardiovascular;  Laterality: N/A;   SPINE SURGERY       TUBAL LIGATION            Patient Active Problem List    Diagnosis Date Noted   Paroxysmal atrial fibrillation (HCC) 08/08/2022   Hypercoagulable state due to paroxysmal atrial fibrillation (HCC) 08/08/2022   Cerebral edema (HCC) 08/07/2021   OSA (obstructive sleep apnea) 08/07/2021   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   CVA (cerebral vascular accident) (HCC) 08/01/2021   GERD (gastroesophageal reflux disease) 07/20/2019   Advanced care planning/counseling discussion 12/17/2016   Skin  lesions, generalized 11/13/2016   Chronic right hip pain 06/17/2016   Vitamin D deficiency 09/26/2015   Diastasis recti 08/08/2015   Elevated serum GGT level 07/24/2015   Essential hypertension 07/17/2015   Fatty liver 07/17/2015   Elevated alkaline phosphatase level 07/17/2015   Elevated serum glutamic pyruvic transaminase (SGPT) level 07/17/2015   Abnormal finding on EKG 07/11/2015   Morbid obesity (HCC) 07/11/2015   Colon, diverticulosis 07/11/2015   Abdominal wall hernia 07/11/2015   DM (diabetes mellitus), type 2 with neurological complications (HCC)     Hyperlipidemia      ONSET DATE: 08/01/2021   REFERRING DIAG: CVA   THERAPY DIAG:  Muscle weakness (generalized)   Rationale for Evaluation and Treatment Rehabilitation   SUBJECTIVE:    SUBJECTIVE STATEMENT:    Pt. reports that she had a nice time  at the beach last week.  Pt accompanied by: self   PERTINENT HISTORY:  Pt. is a 77 y.o. female who had a unexpectedly hospitalized from 2/12-2/14/2024 for COVID-19, and a UTI.  Pt. 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 Pt. attended inpatient rehab from 08/07/2021-08/22/2021. Pt. received Home health therapy services this past spring.  Pt. has recently had an assessment through driver rehabilitation services at [facility] with recommendations for referrals for  outpatient OT/PT, and ST services. Pt. PMHx includes: HTN, Hyperlipidemia, Macular degeneration, DM, and Obesity.   PRECAUTIONS: None   WEIGHT BEARING RESTRICTIONS No   PAIN:  Are you having pain?  0/10    FALLS: Has patient fallen in last 6 months? Yes.   LIVING ENVIRONMENT: Lives with: lives with their family  Son  [name] Lives in: House/apartment Main living are on one floor Stairs:  2 steps to enter Has following equipment at home: Single point cane, Wheelchair (manual), shower chair, Grab bars, bed rail, and rubber mat.   PLOF: Independent   PATIENT GOALS: To be able to Drive, and cook again   OBJECTIVE:    HAND DOMINANCE: Right    ADLs:  Transfers/ambulation related to ADLs: Independent Eating: Independent Grooming: [employer] Pt. reports being unable to use her left hand to perform haircare.  UB Dressing: Independent pullover shirt, independent now with fastening a bra LB Dressing: Independent donning pants socks, and slide on shoes Toileting: Independent Bathing: Independent Tub Shower transfers: Walk-in shower Independent now    IADLs: Shopping: Needs to be accompanied to the grocery store. Light housekeeping: Do  Meal Prep:  Is able to perform light meal prep Community mobility: Relies on son, and friend Medication management: Son sets up Mining engineer management:TBD Handwriting: TBD   MOBILITY STATUS: Hx of falls out of bed     ACTIVITY  TOLERANCE: Activity tolerance:  10-20 min. Before rest break   FUNCTIONAL OUTCOME MEASURES: FOTO: 61  TR score: 62  10/09/2022: FOTO: 49   UPPER EXTREMITY ROM      Active ROM Right eval Left eval Left  10/09/2022 Left 10/23/22 Left 12/02/2022 Left 01/13/2023 Left  03/10/2023 Left 04/21/23  Shoulder flexion WFL 54(74) scaption 0(55) 26(80) 32(82) 35(82) 35(85) 40(87)  Shoulder abduction WFL 58(75) 32(54) 32(50) 45(60) 47(70) 62(72) 48(76)  Shoulder adduction            Shoulder extension            Shoulder internal rotation            Shoulder external rotation            Elbow flexion [facility]  [phone] 145 145 147  Elbow extension [facility] WFL 10(0) 10(0) WFL WFL [facility] WFL  Wrist flexion WFL TBD [facility] WFL  Wrist extension WFL TBD [facility] WFL  Wrist ulnar deviation            Wrist radial deviation            Wrist pronation            Wrist supination            (Blank rows = not tested)     UPPER EXTREMITY MMT:      MMT Right eval Left eval Left 10/09/2022 Left 10/23/2022 Left 12/02/2022 Left 01/13/2023 Left 03/10/2023 Left 04/21/23  Shoulder flexion 4+/5 2+/5 1/5 2-/5 2-/5 2-/5 2-/5 2-/5  Shoulder abduction 4+/5 2+/5 2-/5 2-/5 2-/5 2-/5 2-/5 2-/5  Shoulder adduction            Shoulder extension            Shoulder internal rotation            Shoulder external rotation            Middle trapezius            Lower trapezius            Elbow flexion 4+/5 3+/5 3/5 3/5 3+/5 3+/5 4/5 4/5  Elbow extension 4+/5 3+/5 3/5 3/5 3+/5 3+/5 4/5 4/5  Wrist flexion 4+/5 TBD 3/5 3/5 3+/5 3+/5 4/5 4/5  Wrist extension 4+/5 TBD 3/5 3/5 3+/5 3+/5 4/5 4/5  Wrist ulnar deviation            Wrist radial deviation            Wrist pronation            Wrist supination            (Blank rows = not tested)   HAND FUNCTION: Grip strength: Right: 33 lbs; Left: 11 lbs, Lateral pinch: Right: 17 lbs, Left: 2 lbs, and 3 point pinch: Right: 17 lbs, Left: 2  lbs  10/09/2022: Grip strength: Right: 33 lbs; Left: 10 lbs, Lateral pinch: Right: 17 lbs, Left: 1 lbs, and 3 point pinch: Right: 17 lbs, Left: 1 lbs   10/23/2022: Grip strength: Right: 33 lbs; Left: 16 lbs, Lateral pinch: Right: 17 lbs, Left: 1 lbs, and 3 point pinch: Right: 17 lbs, Left: 1 lbs  12/02/2022: Grip strength: Right: 33 lbs; Left: 17 lbs, Lateral pinch: Right: 17 lbs, Left: 7 lbs, and 3 point pinch: Right: 17 lbs, Left: 3 lbs   01/13/2023: Grip strength: Right: 33 lbs; Left: 15 lbs, Lateral pinch: Right: 17 lbs, Left: 9 lbs, and 3 point pinch: Right: 17 lbs, Left: 7 lbs  03/10/2023: Grip strength: Right: 33 lbs; Left: 15 lbs, Lateral pinch: Right: 17 lbs, Left: 9 lbs, and 3 point pinch: Right: 17 lbs, Left: 7 lbs   04/21/2023: Grip strength: Right: 34 lbs; Left: 13 lbs, Lateral pinch: NT 3 point pinch: Right: NT   COORDINATION:   9 Hole Peg test: Right: 27 sec.  sec; Left: 52 sec.  10/09/2022: 9 Hole Peg test: Right: 27 sec.  sec; Left: 1 min. & 35 sec.  01/13/2023: 9 Hole Peg test: Right: 27 sec.  sec; Left: 51. Sec.  03/10/2023: 9 Hole Peg test: Right: 27 sec.  sec; Left: 54 Sec.  04/21/2023: 9 Hole Peg test: Right: 27 sec.  sec; Left:  46 Sec.  SENSATION: Light touch: WFL Proprioception: WFL   COGNITION: Overall cognitive status: Within functional limits for tasks assessed   VISION: Subjective report: Glasses. Just received a new prescription to increase bifocal strength Baseline vision: Macular Degeneration Visual history: macular degeneration   VISION ASSESSMENT: To be further assessed in functional context  Left sided Awareness      PERCEPTION: Limited left sided awareness     TODAY'S TREATMENT:   Therapeutic Exercise:   Pt. tolerated PROM in all joint ranges of the left shoulder following moist heat. Shoulder flexion and abduction at the tabletop surface. Pt. performed left shoulder flexion, and abduction at the tabletop surface, pt. Performed  AAROM/AROM/PROM to the left shoulder 2/2 weakness, limited ROM, and stiffness.   Pt. Performed AAROM with the cane while seated. Reviewed alternative positions for shoulder flexion with the cane in supine. Pt. Worked on BUE strengthening, and reciprocal motion using the UBE while seated for 8 min. with no resistance. Constant monitoring was provided.     Manual Therapy:   Pt. tolerated scapular mobilizations in elevation, depression, abduction/rotation 2/2 tightness. Manual therapy was performed independent of, and in preparation for ROM.           Measurement were obtained, and goals were reviewed with the Pt.   PATIENT EDUCATION: Education details: BUE strengthening and flexibility exercises Person educated: Patient Education method: verbal cues Education comprehension: verbalized and demonstrated understanding; further training needed.    HOME EXERCISE PROGRAM:  Reviewed home activities to enhance left hand digit extension during typing skills.   GOALS: Goals reviewed with patient? Yes     SHORT TERM GOALS: Target date: 04/21/2023    Pt. Will improve FOTO score by 2 points to reflect improved pt. perceived functional performance Baseline: 04/21/2023: 49 03/10/2023: 59 01/13/2023: 49 12/01/2022: FOTO: 51 10/23/2022: FOTO 49 10/09/2022: FOTO 49, FOTO: 61 TR score: 62  Goal status: INITIAL   LONG TERM GOALS: Target date: 06/02/2023    Pt. Will improve left shoulder ROM by 10 degrees to be able able to efficiently apply deodorant Baseline:  04/21/2023: shoulder flexion: 40(87), Abduction: 48(76) 03/10/2023: Shoulder flexion: 35(85) 01/13/2023:  Shoulder flexion: 35(82), Abduction: 47(70) 12/02/2022: Shoulder flexion: 32(82), Abduction: 45(60)  10/23/2022: Shoulder flexion: 26(80), Abduction: 32(50) 10/09/2022: flexion: 0(55), Abduction: 32(54) Goal status: Improving, ongoing   2.  Pt. will be able to independently hold and use a blow dryer, and brush for hair care. Baseline: 04/21/2023:  Pt. is able to hold the hair dryer in the left hand, however is unable to reach up to dry it, and has difficulty using a brush with the left UE. 03/10/2023: Pt. continues to be able to hold the hair dryer in the left hand, however is unable to reach up to dry it, and has difficulty using a brush 01/13/2023: Pt. Can hold the hair dryer in the left hand, however is unable to reach up to dry it, and has difficulty using a brush. 12/02/2022: Pt. is unable to actively elevate he left shoulder in order to blow dry her hair. 10/23/2022: Pt. continues to be unable to hold a blow dryer. Eval: Pt. Is unable to sustain her BUEs in elevation, and use a blow dryer, and brush. 10/09/2022: Pt. Is unable to hold a blow dryer. Eval: Pt. Is unable to sustain her BUEs in elevation, and use a blow dryer, and brush. Goal status: Ongoing   3.  Pt. Will improve left lateral pinch strength by 3# to be able to independently  cut meat. Baseline: 04/21/2023: NT(Pinch Meter sent out for calibration) Pt. continues to have difficulty cutting meat. 03/10/2023: Left lateral pinch strength: 9#   01/13/2023: Left lateral pinch strength: 9#  12/02/2022: Left pinch: 7# Pt. has difficulty cutting meat. 10/23/2022: Pt. Continues to present with difficulty cutting meat. 10/09/2022: 1#  Eval: 2#. Pt. has difficulty stabilizing utensil, and food while cutting food. Goal status: Ongoing   4.  Pt. Will improve Left hand [facility] skills to be able to be able to independently, and efficiently manipulate small objects during ADL tasks. Baseline 04/21/2023:  46 sec. 03/10/2023: 54 sec. 01/13/2023: 51 sec. 12/02/2022: TBD  10/23/2022: Pt. Continues to present with difficulty manipulating small objects. 10/09/2022: Left: 1 min & 35 sec.: Eval: Left: 52 sec. Right: 27 sec. Goal status: Improving, Ongoing     6.: Pt. will improve with left grip strength to be able to hold and pull up pants Baseline: 04/21/2023: Pt. is able to pull up covers, however has difficulty pulling  up pants. 03/10/2023: left  grip strength: 15# 01/13/2023: left  grip strength: 15# 12/02/2022: 17# 10/23/2022: Left: 16# 10/09/2022: Left: 10# Eval:  Left grip strength 11#. Pt. has difficulty holding a full drink  securely with the left hand.             Goal status: Improved, Revised to pants.   7. Pt. will improve typing speed, and accuracy in preparation for efficiently typing simple email message. Baseline: 04/21/2023:  Pt. continues to present with limited speed, and accuracy with typing  03/10/2023: Pt. continues to present with limited speed, and accuracy with typing 01/13/2023: TBD 12/02/2022:  Pt. continues to present with limited speed, and accuracy with typing.10/23/2022: Pt. continues to present with limited speed, and accuracy with typing. 10/09/2022: Pt. presents with difficulty typing. Pt. presents with difficulty typing an email. Typing speed, and accuracy TBD             Goal status: Improved                                        CLINICAL IMPRESSION:  Pt. reports not having any pain in the left shoulder since the receiving the injections. Pt. was able to demonstrate improved tolerance with, and increased ROM  with the left shoulder today. Pt. Presented with decreased compensation proximally with hiking in the left shoulder during exercises. Pt. continues to benefit from OT services to work towards improving Left UE functioning in order to maximize engagement in, and overall independence with ADLs, and IADL tasks.     PERFORMANCE DEFICITS in functional skills including ADLs, IADLs, coordination, dexterity, sensation, ROM, strength, FMC, decreased knowledge of use of DME, vision, and UE functional use, cognitive skills including attention, and psychosocial skills including coping strategies, environmental adaptation, habits, and routines and behaviors.    IMPAIRMENTS are limiting patient from ADLs, IADLs, and social participation.    COMORBIDITIES may have co-morbidities  that affects  occupational performance. Patient will benefit from skilled OT to address above impairments and improve overall function.   MODIFICATION OR ASSISTANCE TO COMPLETE EVALUATION: Min-Moderate modification of tasks or assist with assess necessary to complete an evaluation.   OT OCCUPATIONAL PROFILE AND HISTORY: Detailed assessment: Review of records and additional review of physical, cognitive, psychosocial history related to current functional performance.   CLINICAL DECISION MAKING: Moderate - several treatment options, min-mod task modification necessary   REHAB  POTENTIAL: Good   EVALUATION COMPLEXITY: Moderate      PLAN: OT FREQUENCY: 2x/week   OT DURATION: 12 weeks   PLANNED INTERVENTIONS: self care/ADL training, therapeutic exercise, therapeutic activity, neuromuscular re-education, manual therapy, passive range of motion, and paraffin   RECOMMENDED OTHER SERVICES: ST, and PT   CONSULTED AND AGREED WITH PLAN OF CARE: Patient   PLAN FOR NEXT SESSION:  Left hand strengthening and coordination exercises   Olegario Messier, MS, OTR/L  05/05/2023

## 2023-05-07 ENCOUNTER — Telehealth: Payer: Self-pay | Admitting: Occupational Therapy

## 2023-05-07 ENCOUNTER — Ambulatory Visit: Payer: Medicare HMO | Admitting: Physical Therapy

## 2023-05-07 ENCOUNTER — Ambulatory Visit: Payer: Medicare HMO | Admitting: Occupational Therapy

## 2023-05-07 NOTE — Telephone Encounter (Signed)
Pt. did not arrive for her OT appointment this morning. An attempt was made to reach out to the Pt. Via telephone. Spoke with the Pt.'s son which revealed confusion with the therapy time schedule with morning.

## 2023-05-12 ENCOUNTER — Ambulatory Visit: Payer: Medicare HMO | Admitting: Physical Therapy

## 2023-05-12 ENCOUNTER — Ambulatory Visit: Payer: Medicare HMO | Admitting: Occupational Therapy

## 2023-05-12 DIAGNOSIS — R278 Other lack of coordination: Secondary | ICD-10-CM

## 2023-05-12 DIAGNOSIS — M6281 Muscle weakness (generalized): Secondary | ICD-10-CM

## 2023-05-12 NOTE — Therapy (Signed)
Occupational Therapy Neuro Treatment Note   Patient Name: Alyssa Mayo MRN: [id number] DOB:08-17-1945, 77 y.o., female Today's Date: 03/14/2022   PCP: [name], MD REFERRING PROVIDER: [name], NP     OT End of Session - 05/12/23 1051     Visit Number 52    Number of Visits 72    Date for OT Re-Evaluation 06/02/23    OT Start Time 1015    OT Stop Time 1100    OT Time Calculation (min) 45 min    Activity Tolerance Patient tolerated treatment well    Behavior During Therapy [facility] for tasks assessed/performed                                               Past Medical History:  Diagnosis Date   Abnormal levels of other serum enzymes 07/17/2015   Arthritis     Constipation 07/11/2015   COVID-19 03/06/2021   Diabetes mellitus without complication (HCC)     Elevated liver enzymes     Fatty liver     Generalized abdominal pain 07/11/2015   Hyperlipidemia     Hypertension     Lifelong obesity     Macular degeneration     Morbid obesity due to excess calories (HCC) 07/11/2015   Other fatigue 07/11/2015   Sleep apnea     Spinal headache           Past Surgical History:  Procedure Laterality Date   ABDOMINAL HYSTERECTOMY       APPENDECTOMY       CATARACT EXTRACTION       CERVICAL LAMINECTOMY   07/21/1985   CESAREAN SECTION       COLONOSCOPY   07/21/2012    diverticulosis, [facility] Dr. [name]    COLONOSCOPY WITH PROPOFOL N/A 02/04/2019    Procedure: COLONOSCOPY WITH PROPOFOL;  Surgeon: [name], MD;  Location: [facility] ENDOSCOPY;  Service: Endoscopy;  Laterality: N/A;   COLONOSCOPY WITH PROPOFOL N/A 03/18/2021    Procedure: COLONOSCOPY WITH PROPOFOL;  Surgeon: [name], MD;  Location: [facility] ENDOSCOPY;  Service: Endoscopy;  Laterality: N/A;  DM   DIAGNOSTIC LAPAROSCOPY       ESOPHAGOGASTRODUODENOSCOPY (EGD) WITH PROPOFOL N/A 02/04/2019    Procedure: ESOPHAGOGASTRODUODENOSCOPY (EGD) WITH PROPOFOL;  Surgeon: [name], MD;   Location: [facility] ENDOSCOPY;  Service: Endoscopy;  Laterality: N/A;   ESOPHAGOGASTRODUODENOSCOPY (EGD) WITH PROPOFOL N/A 03/18/2021    Procedure: ESOPHAGOGASTRODUODENOSCOPY (EGD) WITH PROPOFOL;  Surgeon: [name], MD;  Location: [facility] ENDOSCOPY;  Service: Endoscopy;  Laterality: N/A;   LOOP RECORDER INSERTION N/A 08/05/2021    Procedure: LOOP RECORDER INSERTION;  Surgeon: [name], MD;  Location: [facility] INVASIVE CV LAB;  Service: Cardiovascular;  Laterality: N/A;   SPINE SURGERY       TUBAL LIGATION            Patient Active Problem List    Diagnosis Date Noted   Paroxysmal atrial fibrillation (HCC) 08/08/2022   Hypercoagulable state due to paroxysmal atrial fibrillation (HCC) 08/08/2022   Cerebral edema (HCC) 08/07/2021   OSA (obstructive sleep apnea) 08/07/2021   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   CVA (cerebral vascular accident) (HCC) 08/01/2021   GERD (gastroesophageal reflux disease) 07/20/2019   Advanced care planning/counseling discussion 12/17/2016   Skin lesions, generalized 11/13/2016   Chronic right hip pain 06/17/2016  Vitamin D deficiency 09/26/2015   Diastasis recti 08/08/2015   Elevated serum GGT level 07/24/2015   Essential hypertension 07/17/2015   Fatty liver 07/17/2015   Elevated alkaline phosphatase level 07/17/2015   Elevated serum glutamic pyruvic transaminase (SGPT) level 07/17/2015   Abnormal finding on EKG 07/11/2015   Morbid obesity (HCC) 07/11/2015   Colon, diverticulosis 07/11/2015   Abdominal wall hernia 07/11/2015   DM (diabetes mellitus), type 2 with neurological complications (HCC)     Hyperlipidemia      ONSET DATE: 08/01/2021   REFERRING DIAG: CVA   THERAPY DIAG:  Muscle weakness (generalized)   Rationale for Evaluation and Treatment Rehabilitation   SUBJECTIVE:    SUBJECTIVE STATEMENT:    Pt. reports that she  has no pain in the shoulder  Pt accompanied by: self   PERTINENT HISTORY:  Pt. is a 77 y.o.  female who had a unexpectedly hospitalized from 2/12-2/14/2024 for COVID-19, and a UTI.  Pt. 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 Pt. attended inpatient rehab from 08/07/2021-08/22/2021. Pt. received Home health therapy services this past spring.  Pt. has recently had an assessment through driver rehabilitation services at [facility] with recommendations for referrals for  outpatient OT/PT, and ST services. Pt. PMHx includes: HTN, Hyperlipidemia, Macular degeneration, DM, and Obesity.   PRECAUTIONS: None   WEIGHT BEARING RESTRICTIONS No   PAIN:  Are you having pain?  0/10    FALLS: Has patient fallen in last 6 months? Yes.   LIVING ENVIRONMENT: Lives with: lives with their family  Son  [name] Lives in: House/apartment Main living are on one floor Stairs:  2 steps to enter Has following equipment at home: Single point cane, Wheelchair (manual), shower chair, Grab bars, bed rail, and rubber mat.   PLOF: Independent   PATIENT GOALS: To be able to Drive, and cook again   OBJECTIVE:    HAND DOMINANCE: Right    ADLs:  Transfers/ambulation related to ADLs: Independent Eating: Independent Grooming: [employer] Pt. reports being unable to use her left hand to perform haircare.  UB Dressing: Independent pullover shirt, independent now with fastening a bra LB Dressing: Independent donning pants socks, and slide on shoes Toileting: Independent Bathing: Independent Tub Shower transfers: Walk-in shower Independent now    IADLs: Shopping: Needs to be accompanied to the grocery store. Light housekeeping: Do  Meal Prep:  Is able to perform light meal prep Community mobility: Relies on son, and friend Medication management: Son sets up Mining engineer management:TBD Handwriting: TBD   MOBILITY STATUS: Hx of falls out of bed     ACTIVITY TOLERANCE: Activity tolerance:  10-20 min. Before rest break   FUNCTIONAL OUTCOME  MEASURES: FOTO: 61  TR score: 62  10/09/2022: FOTO: 49   UPPER EXTREMITY ROM      Active ROM Right eval Left eval Left  10/09/2022 Left 10/23/22 Left 12/02/2022 Left 01/13/2023 Left  03/10/2023 Left 04/21/23 Left 05/12/23  Shoulder flexion WFL 54(74) scaption 0(55) 26(80) 32(82) 35(82) 35(85) 40(87) 70(96)  Shoulder abduction WFL 58(75) 32(54) 32(50) 45(60) 47(70) 62(72) 48(76) 73(102)  Shoulder adduction             Shoulder extension             Shoulder internal rotation             Shoulder external rotation             Elbow flexion [facility] WFL 145 145 145 145 145 147  Elbow extension [facility] WFL 10(0) 10(0) WFL WFL [facility] WFL   Wrist flexion WFL TBD [facility] WFL   Wrist extension WFL TBD [facility] WFL WFL   Wrist ulnar deviation             Wrist radial deviation             Wrist pronation             Wrist supination             (Blank rows = not tested)     UPPER EXTREMITY MMT:      MMT Right eval Left eval Left 10/09/2022 Left 10/23/2022 Left 12/02/2022 Left 01/13/2023 Left 03/10/2023 Left 04/21/23  Shoulder flexion 4+/5 2+/5 1/5 2-/5 2-/5 2-/5 2-/5 2-/5  Shoulder abduction 4+/5 2+/5 2-/5 2-/5 2-/5 2-/5 2-/5 2-/5  Shoulder adduction            Shoulder extension            Shoulder internal rotation            Shoulder external rotation            Middle trapezius            Lower trapezius            Elbow flexion 4+/5 3+/5 3/5 3/5 3+/5 3+/5 4/5 4/5  Elbow extension 4+/5 3+/5 3/5 3/5 3+/5 3+/5 4/5 4/5  Wrist flexion 4+/5 TBD 3/5 3/5 3+/5 3+/5 4/5 4/5  Wrist extension 4+/5 TBD 3/5 3/5 3+/5 3+/5 4/5 4/5  Wrist ulnar deviation            Wrist radial deviation            Wrist pronation            Wrist supination            (Blank rows = not tested)   HAND FUNCTION: Grip strength: Right: 33 lbs; Left: 11 lbs, Lateral pinch: Right: 17 lbs, Left: 2 lbs, and 3 point pinch: Right: 17 lbs, Left: 2 lbs  10/09/2022: Grip strength: Right: 33 lbs; Left: 10  lbs, Lateral pinch: Right: 17 lbs, Left: 1 lbs, and 3 point pinch: Right: 17 lbs, Left: 1 lbs   10/23/2022: Grip strength: Right: 33 lbs; Left: 16 lbs, Lateral pinch: Right: 17 lbs, Left: 1 lbs, and 3 point pinch: Right: 17 lbs, Left: 1 lbs  12/02/2022: Grip strength: Right: 33 lbs; Left: 17 lbs, Lateral pinch: Right: 17 lbs, Left: 7 lbs, and 3 point pinch: Right: 17 lbs, Left: 3 lbs   01/13/2023: Grip strength: Right: 33 lbs; Left: 15 lbs, Lateral pinch: Right: 17 lbs, Left: 9 lbs, and 3 point pinch: Right: 17 lbs, Left: 7 lbs  03/10/2023: Grip strength: Right: 33 lbs; Left: 15 lbs, Lateral pinch: Right: 17 lbs, Left: 9 lbs, and 3 point pinch: Right: 17 lbs, Left: 7 lbs   04/21/2023: Grip strength: Right: 34 lbs; Left: 13 lbs, Lateral pinch: NT 3 point pinch: Right: NT   COORDINATION:   9 Hole Peg test: Right: 27 sec.  sec; Left: 52 sec.  10/09/2022: 9 Hole Peg test: Right: 27 sec.  sec; Left: 1 min. & 35 sec.  01/13/2023: 9 Hole Peg test: Right: 27 sec.  sec; Left: 51. Sec.  03/10/2023: 9 Hole Peg test: Right: 27 sec.  sec; Left: 54 Sec.  04/21/2023: 9 Hole Peg test: Right: 27 sec.  sec; Left:  46 Sec.  SENSATION: Light touch: WFL Proprioception: WFL   COGNITION: Overall cognitive status: Within functional limits for tasks assessed   VISION: Subjective report: Glasses. Just received a new prescription to increase bifocal strength Baseline vision: Macular Degeneration Visual history: macular degeneration   VISION ASSESSMENT: To be further assessed in functional context  Left sided Awareness      PERCEPTION: Limited left sided awareness     TODAY'S TREATMENT:   Therapeutic Exercise:   Pt. tolerated PROM in all joint ranges of the left shoulder following moist heat. Shoulder flexion and abduction at the tabletop surface. Pt. performed left shoulder flexion, and abduction at the tabletop surface, pt. Performed AAROM/AROM/PROM to the left shoulder 2/2 weakness, limited ROM, and  stiffness.   Pt. Performed AAROM with the cane while seated. Reviewed alternative positions for shoulder flexion with the cane in supine. Pt. Worked on BUE strengthening, and reciprocal motion using the UBE while seated for 8 min. with no resistance. Constant monitoring was provided.     Manual Therapy:   Pt. tolerated scapular mobilizations in elevation, depression, abduction/rotation 2/2 tightness. Manual therapy was performed independent of, and in preparation for ROM.    Neuromuscular re-education:  Pt. worked on [facility] skills grasping 1" sticks. Pt. worked on storing the objects in the palm, and translatory skills moving the items from the palm of the hand to the tip of the 2nd digit, and worked on removing the pegs using bilateral alternating hand patterns.           Measurement were obtained, and goals were reviewed with the Pt.   PATIENT EDUCATION: Education details: BUE strengthening and flexibility exercises Person educated: Patient Education method: verbal cues Education comprehension: verbalized and demonstrated understanding; further training needed.    HOME EXERCISE PROGRAM:  Reviewed home activities to enhance left hand digit extension during typing skills.   GOALS: Goals reviewed with patient? Yes     SHORT TERM GOALS: Target date: 04/21/2023    Pt. Will improve FOTO score by 2 points to reflect improved pt. perceived functional performance Baseline: 04/21/2023: 49 03/10/2023: 59 01/13/2023: 49 12/01/2022: FOTO: 51 10/23/2022: FOTO 49 10/09/2022: FOTO 49, FOTO: 61 TR score: 62  Goal status: INITIAL   LONG TERM GOALS: Target date: 06/02/2023    Pt. Will improve left shoulder ROM by 10 degrees to be able able to efficiently apply deodorant Baseline:  04/21/2023: shoulder flexion: 40(87), Abduction: 48(76) 03/10/2023: Shoulder flexion: 35(85) 01/13/2023:  Shoulder flexion: 35(82), Abduction: 47(70) 12/02/2022: Shoulder flexion: 32(82), Abduction: 45(60)  10/23/2022: Shoulder  flexion: 26(80), Abduction: 32(50) 10/09/2022: flexion: 0(55), Abduction: 32(54) Goal status: Improving, ongoing   2.  Pt. will be able to independently hold and use a blow dryer, and brush for hair care. Baseline: 04/21/2023: Pt. is able to hold the hair dryer in the left hand, however is unable to reach up to dry it, and has difficulty using a brush with the left UE. 03/10/2023: Pt. continues to be able to hold the hair dryer in the left hand, however is unable to reach up to dry it, and has difficulty using a brush 01/13/2023: Pt. Can hold the hair dryer in the left hand, however is unable to reach up to dry it, and has difficulty using a brush. 12/02/2022: Pt. is unable to actively elevate he left shoulder in order to blow dry her hair. 10/23/2022: Pt. continues to be unable to hold a blow dryer. Eval: Pt. Is unable to sustain her BUEs in elevation, and  use a blow dryer, and brush. 10/09/2022: Pt. Is unable to hold a blow dryer. Eval: Pt. Is unable to sustain her BUEs in elevation, and use a blow dryer, and brush. Goal status: Ongoing   3.  Pt. Will improve left lateral pinch strength by 3# to be able to independently cut meat. Baseline: 04/21/2023: NT(Pinch Meter sent out for calibration) Pt. continues to have difficulty cutting meat. 03/10/2023: Left lateral pinch strength: 9#   01/13/2023: Left lateral pinch strength: 9#  12/02/2022: Left pinch: 7# Pt. has difficulty cutting meat. 10/23/2022: Pt. Continues to present with difficulty cutting meat. 10/09/2022: 1#  Eval: 2#. Pt. has difficulty stabilizing utensil, and food while cutting food. Goal status: Ongoing   4.  Pt. Will improve Left hand [facility] skills to be able to be able to independently, and efficiently manipulate small objects during ADL tasks. Baseline 04/21/2023:  46 sec. 03/10/2023: 54 sec. 01/13/2023: 51 sec. 12/02/2022: TBD  10/23/2022: Pt. Continues to present with difficulty manipulating small objects. 10/09/2022: Left: 1 min & 35 sec.: Eval: Left:  52 sec. Right: 27 sec. Goal status: Improving, Ongoing     6.: Pt. will improve with left grip strength to be able to hold and pull up pants Baseline: 04/21/2023: Pt. is able to pull up covers, however has difficulty pulling up pants. 03/10/2023: left  grip strength: 15# 01/13/2023: left  grip strength: 15# 12/02/2022: 17# 10/23/2022: Left: 16# 10/09/2022: Left: 10# Eval:  Left grip strength 11#. Pt. has difficulty holding a full drink  securely with the left hand.             Goal status: Improved, Revised to pants.   7. Pt. will improve typing speed, and accuracy in preparation for efficiently typing simple email message. Baseline: 04/21/2023:  Pt. continues to present with limited speed, and accuracy with typing  03/10/2023: Pt. continues to present with limited speed, and accuracy with typing 01/13/2023: TBD 12/02/2022:  Pt. continues to present with limited speed, and accuracy with typing.10/23/2022: Pt. continues to present with limited speed, and accuracy with typing. 10/09/2022: Pt. presents with difficulty typing. Pt. presents with difficulty typing an email. Typing speed, and accuracy TBD             Goal status: Improved                                        CLINICAL IMPRESSION:  Pt. continues to report not having any pain in the left shoulder since the receiving the injections. Pt. continues to present with improved tolerance with, and increased ROM  with the left shoulder. Pt. with improved AROM in the LUE. Pt. required cues for bilateral altenating hand movements.  Pt. continues to present with difficulty performing translatory movements of the hand. Pt. requires cues for hand placement. Pt. presented with decreased compensation proximally with hiking in the left shoulder during exercises. Pt. continues to benefit from OT services to work towards improving Left UE functioning in order to maximize engagement in, and overall independence with ADLs, and IADL tasks.     PERFORMANCE DEFICITS in  functional skills including ADLs, IADLs, coordination, dexterity, sensation, ROM, strength, FMC, decreased knowledge of use of DME, vision, and UE functional use, cognitive skills including attention, and psychosocial skills including coping strategies, environmental adaptation, habits, and routines and behaviors.    IMPAIRMENTS are limiting patient from ADLs, IADLs, and social participation.  COMORBIDITIES may have co-morbidities  that affects occupational performance. Patient will benefit from skilled OT to address above impairments and improve overall function.   MODIFICATION OR ASSISTANCE TO COMPLETE EVALUATION: Min-Moderate modification of tasks or assist with assess necessary to complete an evaluation.   OT OCCUPATIONAL PROFILE AND HISTORY: Detailed assessment: Review of records and additional review of physical, cognitive, psychosocial history related to current functional performance.   CLINICAL DECISION MAKING: Moderate - several treatment options, min-mod task modification necessary   REHAB POTENTIAL: Good   EVALUATION COMPLEXITY: Moderate      PLAN: OT FREQUENCY: 2x/week   OT DURATION: 12 weeks   PLANNED INTERVENTIONS: self care/ADL training, therapeutic exercise, therapeutic activity, neuromuscular re-education, manual therapy, passive range of motion, and paraffin   RECOMMENDED OTHER SERVICES: ST, and PT   CONSULTED AND AGREED WITH PLAN OF CARE: Patient   PLAN FOR NEXT SESSION:  Left hand strengthening and coordination exercises   Olegario Messier, MS, OTR/L  05/12/2023

## 2023-05-14 ENCOUNTER — Ambulatory Visit: Payer: Medicare HMO | Admitting: Occupational Therapy

## 2023-05-14 ENCOUNTER — Ambulatory Visit: Payer: Medicare HMO | Admitting: Physical Therapy

## 2023-05-18 ENCOUNTER — Ambulatory Visit (INDEPENDENT_AMBULATORY_CARE_PROVIDER_SITE_OTHER): Payer: Medicare HMO

## 2023-05-18 DIAGNOSIS — I639 Cerebral infarction, unspecified: Secondary | ICD-10-CM | POA: Diagnosis not present

## 2023-05-19 ENCOUNTER — Ambulatory Visit: Payer: Medicare HMO | Admitting: Physical Therapy

## 2023-05-19 ENCOUNTER — Ambulatory Visit: Payer: Medicare HMO | Admitting: Occupational Therapy

## 2023-05-19 DIAGNOSIS — R278 Other lack of coordination: Secondary | ICD-10-CM

## 2023-05-19 DIAGNOSIS — M6281 Muscle weakness (generalized): Secondary | ICD-10-CM | POA: Diagnosis not present

## 2023-05-19 LAB — CUP PACEART REMOTE DEVICE CHECK
Date Time Interrogation Session: 20241027231246
Implantable Pulse Generator Implant Date: 20230116

## 2023-05-19 NOTE — Therapy (Signed)
Occupational Therapy Neuro Treatment Note   Patient Name: Alyssa Mayo MRN: [id number] DOB:[birth year], 77 y.o., female Today's Date: 03/14/2022   PCP: [name], MD REFERRING PROVIDER: [name], NP     OT End of Session - 05/19/23 1020     Visit Number 53    Number of Visits 72    Date for OT Re-Evaluation 06/02/23    OT Start Time 1015    OT Stop Time 1100    OT Time Calculation (min) 45 min    Activity Tolerance Patient tolerated treatment well    Behavior During Therapy [facility] for tasks assessed/performed                                               Past Medical History:  Diagnosis Date   Abnormal levels of other serum enzymes 07/17/2015   Arthritis     Constipation 07/11/2015   COVID-19 03/06/2021   Diabetes mellitus without complication (HCC)     Elevated liver enzymes     Fatty liver     Generalized abdominal pain 07/11/2015   Hyperlipidemia     Hypertension     Lifelong obesity     Macular degeneration     Morbid obesity due to excess calories (HCC) 07/11/2015   Other fatigue 07/11/2015   Sleep apnea     Spinal headache           Past Surgical History:  Procedure Laterality Date   ABDOMINAL HYSTERECTOMY       APPENDECTOMY       CATARACT EXTRACTION       CERVICAL LAMINECTOMY   07/21/1985   CESAREAN SECTION       COLONOSCOPY   07/21/2012    diverticulosis, [facility] Dr. [name]    COLONOSCOPY WITH PROPOFOL N/A 02/04/2019    Procedure: COLONOSCOPY WITH PROPOFOL;  Surgeon: [name], MD;  Location: [facility] ENDOSCOPY;  Service: Endoscopy;  Laterality: N/A;   COLONOSCOPY WITH PROPOFOL N/A 03/18/2021    Procedure: COLONOSCOPY WITH PROPOFOL;  Surgeon: [name], MD;  Location: [facility] ENDOSCOPY;  Service: Endoscopy;  Laterality: N/A;  DM   DIAGNOSTIC LAPAROSCOPY       ESOPHAGOGASTRODUODENOSCOPY (EGD) WITH PROPOFOL N/A 02/04/2019    Procedure: ESOPHAGOGASTRODUODENOSCOPY (EGD) WITH PROPOFOL;  Surgeon: [name], MD;   Location: [facility] ENDOSCOPY;  Service: Endoscopy;  Laterality: N/A;   ESOPHAGOGASTRODUODENOSCOPY (EGD) WITH PROPOFOL N/A 03/18/2021    Procedure: ESOPHAGOGASTRODUODENOSCOPY (EGD) WITH PROPOFOL;  Surgeon: [name], MD;  Location: [facility] ENDOSCOPY;  Service: Endoscopy;  Laterality: N/A;   LOOP RECORDER INSERTION N/A 08/05/2021    Procedure: LOOP RECORDER INSERTION;  Surgeon: [name], MD;  Location: [facility] INVASIVE CV LAB;  Service: Cardiovascular;  Laterality: N/A;   SPINE SURGERY       TUBAL LIGATION            Patient Active Problem List    Diagnosis Date Noted   Paroxysmal atrial fibrillation (HCC) 08/08/2022   Hypercoagulable state due to paroxysmal atrial fibrillation (HCC) 08/08/2022   Cerebral edema (HCC) 08/07/2021   OSA (obstructive sleep apnea) 08/07/2021   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   CVA (cerebral vascular accident) (HCC) 08/01/2021   GERD (gastroesophageal reflux disease) 07/20/2019   Advanced care planning/counseling discussion 12/17/2016   Skin lesions, generalized 11/13/2016   Chronic right hip pain 06/17/2016  Vitamin D deficiency 09/26/2015   Diastasis recti 08/08/2015   Elevated serum GGT level 07/24/2015   Essential hypertension 07/17/2015   Fatty liver 07/17/2015   Elevated alkaline phosphatase level 07/17/2015   Elevated serum glutamic pyruvic transaminase (SGPT) level 07/17/2015   Abnormal finding on EKG 07/11/2015   Morbid obesity (HCC) 07/11/2015   Colon, diverticulosis 07/11/2015   Abdominal wall hernia 07/11/2015   DM (diabetes mellitus), type 2 with neurological complications (HCC)     Hyperlipidemia      ONSET DATE: 08/01/2021   REFERRING DIAG: CVA   THERAPY DIAG:  Muscle weakness (generalized)   Rationale for Evaluation and Treatment Rehabilitation   SUBJECTIVE:    SUBJECTIVE STATEMENT:    Pt. reports that she has no pain in the shoulder.   Pt accompanied by: self   PERTINENT HISTORY:  Pt. is a 77 y.o.  female who had a unexpectedly hospitalized from 2/12-2/14/2024 for COVID-19, and a UTI.  Pt. 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 Pt. attended inpatient rehab from 08/07/2021-08/22/2021. Pt. received Home health therapy services this past spring.  Pt. has recently had an assessment through driver rehabilitation services at [facility] with recommendations for referrals for  outpatient OT/PT, and ST services. Pt. PMHx includes: HTN, Hyperlipidemia, Macular degeneration, DM, and Obesity.   PRECAUTIONS: None   WEIGHT BEARING RESTRICTIONS No   PAIN:  Are you having pain?  0/10    FALLS: Has patient fallen in last 6 months? Yes.   LIVING ENVIRONMENT: Lives with: lives with their family  Son  [name] Lives in: House/apartment Main living are on one floor Stairs:  2 steps to enter Has following equipment at home: Single point cane, Wheelchair (manual), shower chair, Grab bars, bed rail, and rubber mat.   PLOF: Independent   PATIENT GOALS: To be able to Drive, and cook again   OBJECTIVE:    HAND DOMINANCE: Right    ADLs:  Transfers/ambulation related to ADLs: Independent Eating: Independent Grooming: [employer] Pt. reports being unable to use her left hand to perform haircare.  UB Dressing: Independent pullover shirt, independent now with fastening a bra LB Dressing: Independent donning pants socks, and slide on shoes Toileting: Independent Bathing: Independent Tub Shower transfers: Walk-in shower Independent now    IADLs: Shopping: Needs to be accompanied to the grocery store. Light housekeeping: Do  Meal Prep:  Is able to perform light meal prep Community mobility: Relies on son, and friend Medication management: Son sets up Mining engineer management:TBD Handwriting: TBD   MOBILITY STATUS: Hx of falls out of bed     ACTIVITY TOLERANCE: Activity tolerance:  10-20 min. Before rest break   FUNCTIONAL OUTCOME  MEASURES: FOTO: 61  TR score: 62  10/09/2022: FOTO: 49   UPPER EXTREMITY ROM      Active ROM Right eval Left eval Left  10/09/2022 Left 10/23/22 Left 12/02/2022 Left 01/13/2023 Left  03/10/2023 Left 04/21/23 Left 05/12/23  Shoulder flexion WFL 54(74) scaption 0(55) 26(80) 32(82) 35(82) 35(85) 40(87) 70(96)  Shoulder abduction WFL 58(75) 32(54) 32(50) 45(60) 47(70) 62(72) 48(76) 73(102)  Shoulder adduction             Shoulder extension             Shoulder internal rotation             Shoulder external rotation             Elbow flexion [facility] WFL 145 145 145 145 145 147  Elbow extension [facility] WFL 10(0) 10(0) WFL WFL [facility] WFL   Wrist flexion WFL TBD [facility] WFL   Wrist extension WFL TBD [facility] WFL WFL   Wrist ulnar deviation             Wrist radial deviation             Wrist pronation             Wrist supination             (Blank rows = not tested)     UPPER EXTREMITY MMT:      MMT Right eval Left eval Left 10/09/2022 Left 10/23/2022 Left 12/02/2022 Left 01/13/2023 Left 03/10/2023 Left 04/21/23  Shoulder flexion 4+/5 2+/5 1/5 2-/5 2-/5 2-/5 2-/5 2-/5  Shoulder abduction 4+/5 2+/5 2-/5 2-/5 2-/5 2-/5 2-/5 2-/5  Shoulder adduction            Shoulder extension            Shoulder internal rotation            Shoulder external rotation            Middle trapezius            Lower trapezius            Elbow flexion 4+/5 3+/5 3/5 3/5 3+/5 3+/5 4/5 4/5  Elbow extension 4+/5 3+/5 3/5 3/5 3+/5 3+/5 4/5 4/5  Wrist flexion 4+/5 TBD 3/5 3/5 3+/5 3+/5 4/5 4/5  Wrist extension 4+/5 TBD 3/5 3/5 3+/5 3+/5 4/5 4/5  Wrist ulnar deviation            Wrist radial deviation            Wrist pronation            Wrist supination            (Blank rows = not tested)   HAND FUNCTION: Grip strength: Right: 33 lbs; Left: 11 lbs, Lateral pinch: Right: 17 lbs, Left: 2 lbs, and 3 point pinch: Right: 17 lbs, Left: 2 lbs  10/09/2022: Grip strength: Right: 33 lbs; Left: 10  lbs, Lateral pinch: Right: 17 lbs, Left: 1 lbs, and 3 point pinch: Right: 17 lbs, Left: 1 lbs   10/23/2022: Grip strength: Right: 33 lbs; Left: 16 lbs, Lateral pinch: Right: 17 lbs, Left: 1 lbs, and 3 point pinch: Right: 17 lbs, Left: 1 lbs  12/02/2022: Grip strength: Right: 33 lbs; Left: 17 lbs, Lateral pinch: Right: 17 lbs, Left: 7 lbs, and 3 point pinch: Right: 17 lbs, Left: 3 lbs   01/13/2023: Grip strength: Right: 33 lbs; Left: 15 lbs, Lateral pinch: Right: 17 lbs, Left: 9 lbs, and 3 point pinch: Right: 17 lbs, Left: 7 lbs  03/10/2023: Grip strength: Right: 33 lbs; Left: 15 lbs, Lateral pinch: Right: 17 lbs, Left: 9 lbs, and 3 point pinch: Right: 17 lbs, Left: 7 lbs   04/21/2023: Grip strength: Right: 34 lbs; Left: 13 lbs, Lateral pinch: NT 3 point pinch: Right: NT   COORDINATION:   9 Hole Peg test: Right: 27 sec.  sec; Left: 52 sec.  10/09/2022: 9 Hole Peg test: Right: 27 sec.  sec; Left: 1 min. & 35 sec.  01/13/2023: 9 Hole Peg test: Right: 27 sec.  sec; Left: 51. Sec.  03/10/2023: 9 Hole Peg test: Right: 27 sec.  sec; Left: 54 Sec.  04/21/2023: 9 Hole Peg test: Right: 27 sec.  sec; Left:  46 Sec.  SENSATION: Light touch: WFL Proprioception: WFL   COGNITION: Overall cognitive status: Within functional limits for tasks assessed   VISION: Subjective report: Glasses. Just received a new prescription to increase bifocal strength Baseline vision: Macular Degeneration Visual history: macular degeneration   VISION ASSESSMENT: To be further assessed in functional context  Left sided Awareness      PERCEPTION: Limited left sided awareness     TODAY'S TREATMENT:   Therapeutic Exercise:   Pt. tolerated PROM in all joint ranges of the left shoulder following moist heat. Shoulder flexion and abduction at the tabletop surface. Pt. performed left shoulder flexion, and abduction at the tabletop surface. Pt. performed AAROM/AROM/PROM to the left shoulder 2/2 weakness, limited ROM, and  stiffness.   Pt. Performed AAROM with the cane while seated. Reviewed alternative positions in sitting. Pt. worked on [employer], and reciprocal motion using the UBE while seated for 8 min. with no resistance. Constant monitoring was provided. Pt. performed 1.5# dowel ex. for UE strengthening secondary to weakness. Bilateral shoulder flexion, chest press, and circular patterns were performed.    Manual Therapy:   Pt. tolerated scapular mobilizations in elevation, depression, abduction/rotation 2/2 tightness. Manual therapy was performed independent of, and in preparation for ROM.    Neuromuscular re-education:  Pt. worked on [facility] skills manipulating nuts, and bolts on a bolt board. Pt. worked on screwing, and unscrewing nuts, and bolts of varying sizes, and challenging progressively smaller items with her vision occluded.          Measurement were obtained, and goals were reviewed with the Pt.   PATIENT EDUCATION: Education details: BUE strengthening  exercises, and FMC Person educated: Patient Education method: verbal cues Education comprehension: verbalized and demonstrated understanding; further training needed.    HOME EXERCISE PROGRAM:  Reviewed home activities to enhance left hand digit extension during typing skills.   GOALS: Goals reviewed with patient? Yes     SHORT TERM GOALS: Target date: 04/21/2023    Pt. Will improve FOTO score by 2 points to reflect improved pt. perceived functional performance Baseline: 04/21/2023: 49 03/10/2023: 59 01/13/2023: 49 12/01/2022: FOTO: 51 10/23/2022: FOTO 49 10/09/2022: FOTO 49, FOTO: 61 TR score: 62  Goal status: INITIAL   LONG TERM GOALS: Target date: 06/02/2023    Pt. Will improve left shoulder ROM by 10 degrees to be able able to efficiently apply deodorant Baseline:  04/21/2023: shoulder flexion: 40(87), Abduction: 48(76) 03/10/2023: Shoulder flexion: 35(85) 01/13/2023:  Shoulder flexion: 35(82), Abduction: 47(70) 12/02/2022:  Shoulder flexion: 32(82), Abduction: 45(60)  10/23/2022: Shoulder flexion: 26(80), Abduction: 32(50) 10/09/2022: flexion: 0(55), Abduction: 32(54) Goal status: Improving, ongoing   2.  Pt. will be able to independently hold and use a blow dryer, and brush for hair care. Baseline: 04/21/2023: Pt. is able to hold the hair dryer in the left hand, however is unable to reach up to dry it, and has difficulty using a brush with the left UE. 03/10/2023: Pt. continues to be able to hold the hair dryer in the left hand, however is unable to reach up to dry it, and has difficulty using a brush 01/13/2023: Pt. Can hold the hair dryer in the left hand, however is unable to reach up to dry it, and has difficulty using a brush. 12/02/2022: Pt. is unable to actively elevate he left shoulder in order to blow dry her hair. 10/23/2022: Pt. continues to be unable to hold a blow dryer. Eval: Pt. Is unable to sustain her BUEs in elevation,  and use a blow dryer, and brush. 10/09/2022: Pt. Is unable to hold a blow dryer. Eval: Pt. Is unable to sustain her BUEs in elevation, and use a blow dryer, and brush. Goal status: Ongoing   3.  Pt. Will improve left lateral pinch strength by 3# to be able to independently cut meat. Baseline: 04/21/2023: NT(Pinch Meter sent out for calibration) Pt. continues to have difficulty cutting meat. 03/10/2023: Left lateral pinch strength: 9#   01/13/2023: Left lateral pinch strength: 9#  12/02/2022: Left pinch: 7# Pt. has difficulty cutting meat. 10/23/2022: Pt. Continues to present with difficulty cutting meat. 10/09/2022: 1#  Eval: 2#. Pt. has difficulty stabilizing utensil, and food while cutting food. Goal status: Ongoing   4.  Pt. Will improve Left hand [facility] skills to be able to be able to independently, and efficiently manipulate small objects during ADL tasks. Baseline 04/21/2023:  46 sec. 03/10/2023: 54 sec. 01/13/2023: 51 sec. 12/02/2022: TBD  10/23/2022: Pt. Continues to present with difficulty  manipulating small objects. 10/09/2022: Left: 1 min & 35 sec.: Eval: Left: 52 sec. Right: 27 sec. Goal status: Improving, Ongoing     6.: Pt. will improve with left grip strength to be able to hold and pull up pants Baseline: 04/21/2023: Pt. is able to pull up covers, however has difficulty pulling up pants. 03/10/2023: left  grip strength: 15# 01/13/2023: left  grip strength: 15# 12/02/2022: 17# 10/23/2022: Left: 16# 10/09/2022: Left: 10# Eval:  Left grip strength 11#. Pt. has difficulty holding a full drink  securely with the left hand.             Goal status: Improved, Revised to pants.   7. Pt. will improve typing speed, and accuracy in preparation for efficiently typing simple email message. Baseline: 04/21/2023:  Pt. continues to present with limited speed, and accuracy with typing  03/10/2023: Pt. continues to present with limited speed, and accuracy with typing 01/13/2023: TBD 12/02/2022:  Pt. continues to present with limited speed, and accuracy with typing.10/23/2022: Pt. continues to present with limited speed, and accuracy with typing. 10/09/2022: Pt. presents with difficulty typing. Pt. presents with difficulty typing an email. Typing speed, and accuracy TBD             Goal status: Improved                                        CLINICAL IMPRESSION:  Pt. continues to report not having any pain in the left shoulder since receiving the injections. Pt. continues to present with improved tolerance with, and increased ROM  with the left shoulder. Pt. continues to present with improved AROM in the LUE. Pt. was able to efficiently remove the nuts from the bolts, however had difficulty using the left hand to reconnect them. Pt. presented with decreased compensation proximally with hiking in the left shoulder during exercises. Pt. continues to benefit from OT services to work towards improving Left UE functioning in order to maximize engagement in, and overall independence with ADLs, and IADL tasks.      PERFORMANCE DEFICITS in functional skills including ADLs, IADLs, coordination, dexterity, sensation, ROM, strength, FMC, decreased knowledge of use of DME, vision, and UE functional use, cognitive skills including attention, and psychosocial skills including coping strategies, environmental adaptation, habits, and routines and behaviors.    IMPAIRMENTS are limiting patient from ADLs, IADLs, and social participation.    COMORBIDITIES  may have co-morbidities  that affects occupational performance. Patient will benefit from skilled OT to address above impairments and improve overall function.   MODIFICATION OR ASSISTANCE TO COMPLETE EVALUATION: Min-Moderate modification of tasks or assist with assess necessary to complete an evaluation.   OT OCCUPATIONAL PROFILE AND HISTORY: Detailed assessment: Review of records and additional review of physical, cognitive, psychosocial history related to current functional performance.   CLINICAL DECISION MAKING: Moderate - several treatment options, min-mod task modification necessary   REHAB POTENTIAL: Good   EVALUATION COMPLEXITY: Moderate      PLAN: OT FREQUENCY: 2x/week   OT DURATION: 12 weeks   PLANNED INTERVENTIONS: self care/ADL training, therapeutic exercise, therapeutic activity, neuromuscular re-education, manual therapy, passive range of motion, and paraffin   RECOMMENDED OTHER SERVICES: ST, and PT   CONSULTED AND AGREED WITH PLAN OF CARE: Patient   PLAN FOR NEXT SESSION:  Left hand strengthening and coordination exercises   Olegario Messier, MS, OTR/L  05/19/2023

## 2023-05-21 ENCOUNTER — Ambulatory Visit: Payer: Medicare HMO

## 2023-05-21 ENCOUNTER — Ambulatory Visit: Payer: Medicare HMO | Admitting: Physical Therapy

## 2023-05-21 DIAGNOSIS — R278 Other lack of coordination: Secondary | ICD-10-CM

## 2023-05-21 DIAGNOSIS — M6281 Muscle weakness (generalized): Secondary | ICD-10-CM

## 2023-05-21 NOTE — Therapy (Signed)
Occupational Therapy Neuro Treatment Note   Patient Name: Alyssa Mayo MRN: [id number] DOB:02/17/1946, 77 y.o., female Today's Date: 03/14/2022   PCP: [name], MD REFERRING PROVIDER: [name], NP     OT End of Session - 05/21/23 1239     Visit Number 54    Number of Visits 72    Date for OT Re-Evaluation 06/02/23    Authorization Type Progress reporting period starting 01/13/2023    OT Start Time 1015    OT Stop Time 1100    OT Time Calculation (min) 45 min    Activity Tolerance Patient tolerated treatment well    Behavior During Therapy [facility] for tasks assessed/performed                                         Past Medical History:  Diagnosis Date   Abnormal levels of other serum enzymes 07/17/2015   Arthritis     Constipation 07/11/2015   COVID-19 03/06/2021   Diabetes mellitus without complication (HCC)     Elevated liver enzymes     Fatty liver     Generalized abdominal pain 07/11/2015   Hyperlipidemia     Hypertension     Lifelong obesity     Macular degeneration     Morbid obesity due to excess calories (HCC) 07/11/2015   Other fatigue 07/11/2015   Sleep apnea     Spinal headache           Past Surgical History:  Procedure Laterality Date   ABDOMINAL HYSTERECTOMY       APPENDECTOMY       CATARACT EXTRACTION       CERVICAL LAMINECTOMY   07/21/1985   CESAREAN SECTION       COLONOSCOPY   07/21/2012    diverticulosis, [facility] Dr. [name]    COLONOSCOPY WITH PROPOFOL N/A 02/04/2019    Procedure: COLONOSCOPY WITH PROPOFOL;  Surgeon: [name], MD;  Location: [facility] ENDOSCOPY;  Service: Endoscopy;  Laterality: N/A;   COLONOSCOPY WITH PROPOFOL N/A 03/18/2021    Procedure: COLONOSCOPY WITH PROPOFOL;  Surgeon: [name], MD;  Location: [facility] ENDOSCOPY;  Service: Endoscopy;  Laterality: N/A;  DM   DIAGNOSTIC LAPAROSCOPY       ESOPHAGOGASTRODUODENOSCOPY (EGD) WITH PROPOFOL N/A 02/04/2019    Procedure:  ESOPHAGOGASTRODUODENOSCOPY (EGD) WITH PROPOFOL;  Surgeon: [name], MD;  Location: [facility] ENDOSCOPY;  Service: Endoscopy;  Laterality: N/A;   ESOPHAGOGASTRODUODENOSCOPY (EGD) WITH PROPOFOL N/A 03/18/2021    Procedure: ESOPHAGOGASTRODUODENOSCOPY (EGD) WITH PROPOFOL;  Surgeon: [name], MD;  Location: [facility] ENDOSCOPY;  Service: Endoscopy;  Laterality: N/A;   LOOP RECORDER INSERTION N/A 08/05/2021    Procedure: LOOP RECORDER INSERTION;  Surgeon: [name], MD;  Location: [facility] INVASIVE CV LAB;  Service: Cardiovascular;  Laterality: N/A;   SPINE SURGERY       TUBAL LIGATION            Patient Active Problem List    Diagnosis Date Noted   Paroxysmal atrial fibrillation (HCC) 08/08/2022   Hypercoagulable state due to paroxysmal atrial fibrillation (HCC) 08/08/2022   Cerebral edema (HCC) 08/07/2021   OSA (obstructive sleep apnea) 08/07/2021   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   CVA (cerebral vascular accident) (HCC) 08/01/2021   GERD (gastroesophageal reflux disease) 07/20/2019   Advanced care planning/counseling discussion 12/17/2016   Skin lesions, generalized 11/13/2016   Chronic  right hip pain 06/17/2016   Vitamin D deficiency 09/26/2015   Diastasis recti 08/08/2015   Elevated serum GGT level 07/24/2015   Essential hypertension 07/17/2015   Fatty liver 07/17/2015   Elevated alkaline phosphatase level 07/17/2015   Elevated serum glutamic pyruvic transaminase (SGPT) level 07/17/2015   Abnormal finding on EKG 07/11/2015   Morbid obesity (HCC) 07/11/2015   Colon, diverticulosis 07/11/2015   Abdominal wall hernia 07/11/2015   DM (diabetes mellitus), type 2 with neurological complications (HCC)     Hyperlipidemia      ONSET DATE: 08/01/2021   REFERRING DIAG: CVA   THERAPY DIAG:  Muscle weakness (generalized)   Rationale for Evaluation and Treatment Rehabilitation   SUBJECTIVE:    SUBJECTIVE STATEMENT:     Pt reports no pain in the L shoulder  since she was given cortisone injections a couple of weeks ago.  Pt accompanied by: self   PERTINENT HISTORY:  Pt. is a 77 y.o. female who had a unexpectedly hospitalized from 2/12-2/14/2024 for COVID-19, and a UTI.  Pt. 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 Pt. attended inpatient rehab from 08/07/2021-08/22/2021. Pt. received Home health therapy services this past spring.  Pt. has recently had an assessment through driver rehabilitation services at [facility] with recommendations for referrals for  outpatient OT/PT, and ST services. Pt. PMHx includes: HTN, Hyperlipidemia, Macular degeneration, DM, and Obesity.   PRECAUTIONS: None   WEIGHT BEARING RESTRICTIONS No   PAIN:  Are you having pain?  0/10    FALLS: Has patient fallen in last 6 months? Yes.   LIVING ENVIRONMENT: Lives with: lives with their family  Son  [name] Lives in: House/apartment Main living are on one floor Stairs:  2 steps to enter Has following equipment at home: Single point cane, Wheelchair (manual), shower chair, Grab bars, bed rail, and rubber mat.   PLOF: Independent   PATIENT GOALS: To be able to Drive, and cook again   OBJECTIVE:    HAND DOMINANCE: Right    ADLs:  Transfers/ambulation related to ADLs: Independent Eating: Independent Grooming: [employer] Pt. reports being unable to use her left hand to perform haircare.  UB Dressing: Independent pullover shirt, independent now with fastening a bra LB Dressing: Independent donning pants socks, and slide on shoes Toileting: Independent Bathing: Independent Tub Shower transfers: Walk-in shower Independent now    IADLs: Shopping: Needs to be accompanied to the grocery store. Light housekeeping: Do  Meal Prep:  Is able to perform light meal prep Community mobility: Relies on son, and friend Medication management: Son sets up Mining engineer management:TBD Handwriting: TBD   MOBILITY STATUS: Hx of  falls out of bed     ACTIVITY TOLERANCE: Activity tolerance:  10-20 min. Before rest break   FUNCTIONAL OUTCOME MEASURES: FOTO: 61  TR score: 62  10/09/2022: FOTO: 49   UPPER EXTREMITY ROM      Active ROM Right eval Left eval Left  10/09/2022 Left 10/23/22 Left 12/02/2022 Left 01/13/2023 Left  03/10/2023 Left 04/21/23 Left 05/12/23  Shoulder flexion WFL 54(74) scaption 0(55) 26(80) 32(82) 35(82) 35(85) 40(87) 70(96)  Shoulder abduction WFL 58(75) 32(54) 32(50) 45(60) 47(70) 62(72) 48(76) 73(102)  Shoulder adduction             Shoulder extension             Shoulder internal rotation             Shoulder external rotation  Elbow flexion [facility] WFL 145 145 145 145 145 147   Elbow extension [facility] WFL 10(0) 10(0) WFL WFL [facility] WFL   Wrist flexion WFL TBD [facility] WFL   Wrist extension WFL TBD [facility] WFL WFL   Wrist ulnar deviation             Wrist radial deviation             Wrist pronation             Wrist supination             (Blank rows = not tested)     UPPER EXTREMITY MMT:      MMT Right eval Left eval Left 10/09/2022 Left 10/23/2022 Left 12/02/2022 Left 01/13/2023 Left 03/10/2023 Left 04/21/23  Shoulder flexion 4+/5 2+/5 1/5 2-/5 2-/5 2-/5 2-/5 2-/5  Shoulder abduction 4+/5 2+/5 2-/5 2-/5 2-/5 2-/5 2-/5 2-/5  Shoulder adduction            Shoulder extension            Shoulder internal rotation            Shoulder external rotation            Middle trapezius            Lower trapezius            Elbow flexion 4+/5 3+/5 3/5 3/5 3+/5 3+/5 4/5 4/5  Elbow extension 4+/5 3+/5 3/5 3/5 3+/5 3+/5 4/5 4/5  Wrist flexion 4+/5 TBD 3/5 3/5 3+/5 3+/5 4/5 4/5  Wrist extension 4+/5 TBD 3/5 3/5 3+/5 3+/5 4/5 4/5  Wrist ulnar deviation            Wrist radial deviation            Wrist pronation            Wrist supination            (Blank rows = not tested)   HAND FUNCTION: Grip strength: Right: 33 lbs; Left: 11 lbs, Lateral pinch: Right: 17  lbs, Left: 2 lbs, and 3 point pinch: Right: 17 lbs, Left: 2 lbs  10/09/2022: Grip strength: Right: 33 lbs; Left: 10 lbs, Lateral pinch: Right: 17 lbs, Left: 1 lbs, and 3 point pinch: Right: 17 lbs, Left: 1 lbs   10/23/2022: Grip strength: Right: 33 lbs; Left: 16 lbs, Lateral pinch: Right: 17 lbs, Left: 1 lbs, and 3 point pinch: Right: 17 lbs, Left: 1 lbs  12/02/2022: Grip strength: Right: 33 lbs; Left: 17 lbs, Lateral pinch: Right: 17 lbs, Left: 7 lbs, and 3 point pinch: Right: 17 lbs, Left: 3 lbs   01/13/2023: Grip strength: Right: 33 lbs; Left: 15 lbs, Lateral pinch: Right: 17 lbs, Left: 9 lbs, and 3 point pinch: Right: 17 lbs, Left: 7 lbs  03/10/2023: Grip strength: Right: 33 lbs; Left: 15 lbs, Lateral pinch: Right: 17 lbs, Left: 9 lbs, and 3 point pinch: Right: 17 lbs, Left: 7 lbs   04/21/2023: Grip strength: Right: 34 lbs; Left: 13 lbs, Lateral pinch: NT 3 point pinch: Right: NT   COORDINATION:   9 Hole Peg test: Right: 27 sec.  sec; Left: 52 sec.  10/09/2022: 9 Hole Peg test: Right: 27 sec.  sec; Left: 1 min. & 35 sec.  01/13/2023: 9 Hole Peg test: Right: 27 sec.  sec; Left: 51. Sec.  03/10/2023: 9 Hole Peg test: Right: 27 sec.  sec; Left: 54 Sec.  04/21/2023: 9 Hole Peg test: Right: 27 sec.  sec; Left: 46 Sec.  SENSATION: Light touch: WFL Proprioception: WFL   COGNITION: Overall cognitive status: Within functional limits for tasks assessed   VISION: Subjective report: Glasses. Just received a new prescription to increase bifocal strength Baseline vision: Macular Degeneration Visual history: macular degeneration   VISION ASSESSMENT: To be further assessed in functional context  Left sided Awareness      PERCEPTION: Limited left sided awareness   TODAY'S TREATMENT:  Therapeutic Exercise:  -Performed passive L shoulder stretches all planes, including depression and retraction, working to increase flexibility for LUE reaching above shoulder level.  -Dowel climb on table top  to promote L shoulder flexion; min A to support LUE form and to maximize range x7 reps. -1.5 lb dowel exercises to promote LUE strength and flexibility.  Pt performed 7 reps each of bilat shoulder flex, abd, ER to top of head, shoulder ext and IR behind back in standing.  Flex/abd/ER all performed with min A to maximize form and end range.  Therapeutic Activity: Facilitated LUE reaching for Saebo rings on Saebo tower, moving rings between levels 1-3 to promote L forward flexion, and between levels 1 and 2 to promote L shoulder abd.  Pt required constant tactile-min A to maintain L shoulder depression, and min A to move between 2nd and 3rd levels with flex, and between levels 1 and 2 with abd.    PATIENT EDUCATION: Education details: BUE strengthening/flexibility exercises Person educated: Patient Education method: verbal cues Education comprehension: verbalized and demonstrated understanding; further training needed.    HOME EXERCISE PROGRAM:  Reviewed home activities to enhance left hand digit extension during typing skills.   GOALS: Goals reviewed with patient? Yes     SHORT TERM GOALS: Target date: 04/21/2023    Pt. Will improve FOTO score by 2 points to reflect improved pt. perceived functional performance Baseline: 04/21/2023: 49 03/10/2023: 59 01/13/2023: 49 12/01/2022: FOTO: 51 10/23/2022: FOTO 49 10/09/2022: FOTO 49, FOTO: 61 TR score: 62  Goal status: INITIAL   LONG TERM GOALS: Target date: 06/02/2023    Pt. Will improve left shoulder ROM by 10 degrees to be able able to efficiently apply deodorant Baseline:  04/21/2023: shoulder flexion: 40(87), Abduction: 48(76) 03/10/2023: Shoulder flexion: 35(85) 01/13/2023:  Shoulder flexion: 35(82), Abduction: 47(70) 12/02/2022: Shoulder flexion: 32(82), Abduction: 45(60)  10/23/2022: Shoulder flexion: 26(80), Abduction: 32(50) 10/09/2022: flexion: 0(55), Abduction: 32(54) Goal status: Improving, ongoing   2.  Pt. will be able to independently  hold and use a blow dryer, and brush for hair care. Baseline: 04/21/2023: Pt. is able to hold the hair dryer in the left hand, however is unable to reach up to dry it, and has difficulty using a brush with the left UE. 03/10/2023: Pt. continues to be able to hold the hair dryer in the left hand, however is unable to reach up to dry it, and has difficulty using a brush 01/13/2023: Pt. Can hold the hair dryer in the left hand, however is unable to reach up to dry it, and has difficulty using a brush. 12/02/2022: Pt. is unable to actively elevate he left shoulder in order to blow dry her hair. 10/23/2022: Pt. continues to be unable to hold a blow dryer. Eval: Pt. Is unable to sustain her BUEs in elevation, and use a blow dryer, and brush. 10/09/2022: Pt. Is unable to hold a blow dryer. Eval: Pt. Is unable to sustain her BUEs in elevation, and use a blow dryer, and  brush. Goal status: Ongoing   3.  Pt. Will improve left lateral pinch strength by 3# to be able to independently cut meat. Baseline: 04/21/2023: NT(Pinch Meter sent out for calibration) Pt. continues to have difficulty cutting meat. 03/10/2023: Left lateral pinch strength: 9#   01/13/2023: Left lateral pinch strength: 9#  12/02/2022: Left pinch: 7# Pt. has difficulty cutting meat. 10/23/2022: Pt. Continues to present with difficulty cutting meat. 10/09/2022: 1#  Eval: 2#. Pt. has difficulty stabilizing utensil, and food while cutting food. Goal status: Ongoing   4.  Pt. Will improve Left hand [facility] skills to be able to be able to independently, and efficiently manipulate small objects during ADL tasks. Baseline 04/21/2023:  46 sec. 03/10/2023: 54 sec. 01/13/2023: 51 sec. 12/02/2022: TBD  10/23/2022: Pt. Continues to present with difficulty manipulating small objects. 10/09/2022: Left: 1 min & 35 sec.: Eval: Left: 52 sec. Right: 27 sec. Goal status: Improving, Ongoing     6.: Pt. will improve with left grip strength to be able to hold and pull up pants Baseline:  04/21/2023: Pt. is able to pull up covers, however has difficulty pulling up pants. 03/10/2023: left  grip strength: 15# 01/13/2023: left  grip strength: 15# 12/02/2022: 17# 10/23/2022: Left: 16# 10/09/2022: Left: 10# Eval:  Left grip strength 11#. Pt. has difficulty holding a full drink  securely with the left hand.             Goal status: Improved, Revised to pants.   7. Pt. will improve typing speed, and accuracy in preparation for efficiently typing simple email message. Baseline: 04/21/2023:  Pt. continues to present with limited speed, and accuracy with typing  03/10/2023: Pt. continues to present with limited speed, and accuracy with typing 01/13/2023: TBD 12/02/2022:  Pt. continues to present with limited speed, and accuracy with typing.10/23/2022: Pt. continues to present with limited speed, and accuracy with typing. 10/09/2022: Pt. presents with difficulty typing. Pt. presents with difficulty typing an email. Typing speed, and accuracy TBD             Goal status: Improved                                CLINICAL IMPRESSION: Pt reports no pain in the L shoulder since she was given cortisone injections a couple of weeks ago.  Good tolerance to all therapeutic exercises and activities noted above.  Pt continues to present with significant L shoulder stiffness in all planes of movement, though improving, and she verbalized excitement today as she reports that she was able to place a can of soup on a shelf at shoulder height, and was also able to engage the L hand today into shampooing her hair.  Pt. continues to benefit from OT services to work towards improving Left UE functioning in order to maximize engagement in, and overall independence with ADLs, and IADL tasks.     PERFORMANCE DEFICITS in functional skills including ADLs, IADLs, coordination, dexterity, sensation, ROM, strength, FMC, decreased knowledge of use of DME, vision, and UE functional use, cognitive skills including attention, and  psychosocial skills including coping strategies, environmental adaptation, habits, and routines and behaviors.    IMPAIRMENTS are limiting patient from ADLs, IADLs, and social participation.    COMORBIDITIES may have co-morbidities  that affects occupational performance. Patient will benefit from skilled OT to address above impairments and improve overall function.   MODIFICATION OR ASSISTANCE TO COMPLETE EVALUATION: Min-Moderate modification  of tasks or assist with assess necessary to complete an evaluation.   OT OCCUPATIONAL PROFILE AND HISTORY: Detailed assessment: Review of records and additional review of physical, cognitive, psychosocial history related to current functional performance.   CLINICAL DECISION MAKING: Moderate - several treatment options, min-mod task modification necessary   REHAB POTENTIAL: Good   EVALUATION COMPLEXITY: Moderate      PLAN: OT FREQUENCY: 2x/week   OT DURATION: 12 weeks   PLANNED INTERVENTIONS: self care/ADL training, therapeutic exercise, therapeutic activity, neuromuscular re-education, manual therapy, passive range of motion, and paraffin   RECOMMENDED OTHER SERVICES: ST, and PT   CONSULTED AND AGREED WITH PLAN OF CARE: Patient   PLAN FOR NEXT SESSION:  Left hand strengthening and coordination exercises   Danelle Earthly, MS, OTR/L  05/19/2023

## 2023-05-26 ENCOUNTER — Ambulatory Visit: Payer: Medicare HMO | Attending: Adult Health | Admitting: Occupational Therapy

## 2023-05-26 ENCOUNTER — Ambulatory Visit: Payer: Medicare HMO | Admitting: Physical Therapy

## 2023-05-26 DIAGNOSIS — R278 Other lack of coordination: Secondary | ICD-10-CM | POA: Diagnosis present

## 2023-05-26 DIAGNOSIS — I63511 Cerebral infarction due to unspecified occlusion or stenosis of right middle cerebral artery: Secondary | ICD-10-CM | POA: Diagnosis present

## 2023-05-26 DIAGNOSIS — M6281 Muscle weakness (generalized): Secondary | ICD-10-CM | POA: Insufficient documentation

## 2023-05-26 NOTE — Therapy (Addendum)
Occupational Therapy Neuro Treatment Note   Patient Name: Marcela Alatorre MRN: [id number] DOB:1945/09/03, 77 y.o., female Today's Date: 03/14/2022   PCP: [name], MD REFERRING PROVIDER: [name], NP     OT End of Session - 05/26/23 1050     Visit Number 55    Date for OT Re-Evaluation 06/02/23    Authorization Type Progress reporting period starting 01/13/2023    OT Start Time 1015    OT Stop Time 1100    OT Time Calculation (min) 45 min    Behavior During Therapy [facility] for tasks assessed/performed                                        Past Medical History:  Diagnosis Date   Abnormal levels of other serum enzymes 07/17/2015   Arthritis     Constipation 07/11/2015   COVID-19 03/06/2021   Diabetes mellitus without complication (HCC)     Elevated liver enzymes     Fatty liver     Generalized abdominal pain 07/11/2015   Hyperlipidemia     Hypertension     Lifelong obesity     Macular degeneration     Morbid obesity due to excess calories (HCC) 07/11/2015   Other fatigue 07/11/2015   Sleep apnea     Spinal headache           Past Surgical History:  Procedure Laterality Date   ABDOMINAL HYSTERECTOMY       APPENDECTOMY       CATARACT EXTRACTION       CERVICAL LAMINECTOMY   07/21/1985   CESAREAN SECTION       COLONOSCOPY   07/21/2012    diverticulosis, [facility] Dr. [name]    COLONOSCOPY WITH PROPOFOL N/A 02/04/2019    Procedure: COLONOSCOPY WITH PROPOFOL;  Surgeon: [name], MD;  Location: [facility] ENDOSCOPY;  Service: Endoscopy;  Laterality: N/A;   COLONOSCOPY WITH PROPOFOL N/A 03/18/2021    Procedure: COLONOSCOPY WITH PROPOFOL;  Surgeon: [name], MD;  Location: [facility] ENDOSCOPY;  Service: Endoscopy;  Laterality: N/A;  DM   DIAGNOSTIC LAPAROSCOPY       ESOPHAGOGASTRODUODENOSCOPY (EGD) WITH PROPOFOL N/A 02/04/2019    Procedure: ESOPHAGOGASTRODUODENOSCOPY (EGD) WITH PROPOFOL;  Surgeon: [name], MD;  Location: [facility]  ENDOSCOPY;  Service: Endoscopy;  Laterality: N/A;   ESOPHAGOGASTRODUODENOSCOPY (EGD) WITH PROPOFOL N/A 03/18/2021    Procedure: ESOPHAGOGASTRODUODENOSCOPY (EGD) WITH PROPOFOL;  Surgeon: [name], MD;  Location: [facility] ENDOSCOPY;  Service: Endoscopy;  Laterality: N/A;   LOOP RECORDER INSERTION N/A 08/05/2021    Procedure: LOOP RECORDER INSERTION;  Surgeon: [name], MD;  Location: [facility] INVASIVE CV LAB;  Service: Cardiovascular;  Laterality: N/A;   SPINE SURGERY       TUBAL LIGATION            Patient Active Problem List    Diagnosis Date Noted   Paroxysmal atrial fibrillation (HCC) 08/08/2022   Hypercoagulable state due to paroxysmal atrial fibrillation (HCC) 08/08/2022   Cerebral edema (HCC) 08/07/2021   OSA (obstructive sleep apnea) 08/07/2021   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   CVA (cerebral vascular accident) (HCC) 08/01/2021   GERD (gastroesophageal reflux disease) 07/20/2019   Advanced care planning/counseling discussion 12/17/2016   Skin lesions, generalized 11/13/2016   Chronic right hip pain 06/17/2016   Vitamin D deficiency 09/26/2015   Diastasis recti 08/08/2015  Elevated serum GGT level 07/24/2015   Essential hypertension 07/17/2015   Fatty liver 07/17/2015   Elevated alkaline phosphatase level 07/17/2015   Elevated serum glutamic pyruvic transaminase (SGPT) level 07/17/2015   Abnormal finding on EKG 07/11/2015   Morbid obesity (HCC) 07/11/2015   Colon, diverticulosis 07/11/2015   Abdominal wall hernia 07/11/2015   DM (diabetes mellitus), type 2 with neurological complications (HCC)     Hyperlipidemia      ONSET DATE: 08/01/2021   REFERRING DIAG: CVA   THERAPY DIAG:  Muscle weakness (generalized)   Rationale for Evaluation and Treatment Rehabilitation   SUBJECTIVE:    SUBJECTIVE STATEMENT:     Pt. Continues to report no pain in the L shoulder since she was given cortisone injections a couple of weeks ago.  Pt accompanied by:  self   PERTINENT HISTORY:  Pt. is a 77 y.o. female who had a unexpectedly hospitalized from 2/12-2/14/2024 for COVID-19, and a UTI.  Pt. 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 Pt. attended inpatient rehab from 08/07/2021-08/22/2021. Pt. received Home health therapy services this past spring.  Pt. has recently had an assessment through driver rehabilitation services at [facility] with recommendations for referrals for  outpatient OT/PT, and ST services. Pt. PMHx includes: HTN, Hyperlipidemia, Macular degeneration, DM, and Obesity.   PRECAUTIONS: None   WEIGHT BEARING RESTRICTIONS No   PAIN:  Are you having pain?  0/10    FALLS: Has patient fallen in last 6 months? Yes.   LIVING ENVIRONMENT: Lives with: lives with their family  Son  [name] Lives in: House/apartment Main living are on one floor Stairs:  2 steps to enter Has following equipment at home: Single point cane, Wheelchair (manual), shower chair, Grab bars, bed rail, and rubber mat.   PLOF: Independent   PATIENT GOALS: To be able to Drive, and cook again   OBJECTIVE:    HAND DOMINANCE: Right    ADLs:  Transfers/ambulation related to ADLs: Independent Eating: Independent Grooming: [employer] Pt. reports being unable to use her left hand to perform haircare.  UB Dressing: Independent pullover shirt, independent now with fastening a bra LB Dressing: Independent donning pants socks, and slide on shoes Toileting: Independent Bathing: Independent Tub Shower transfers: Walk-in shower Independent now    IADLs: Shopping: Needs to be accompanied to the grocery store. Light housekeeping: Do  Meal Prep:  Is able to perform light meal prep Community mobility: Relies on son, and friend Medication management: Son sets up Mining engineer management:TBD Handwriting: TBD   MOBILITY STATUS: Hx of falls out of bed     ACTIVITY TOLERANCE: Activity tolerance:  10-20 min. Before  rest break   FUNCTIONAL OUTCOME MEASURES: FOTO: 61  TR score: 62  10/09/2022: FOTO: 49   UPPER EXTREMITY ROM      Active ROM Right eval Left eval Left  10/09/2022 Left 10/23/22 Left 12/02/2022 Left 01/13/2023 Left  03/10/2023 Left 04/21/23 Left 05/12/23  Shoulder flexion WFL 54(74) scaption 0(55) 26(80) 32(82) 35(82) 35(85) 40(87) 70(96)  Shoulder abduction WFL 58(75) 32(54) 32(50) 45(60) 47(70) 62(72) 48(76) 73(102)  Shoulder adduction             Shoulder extension             Shoulder internal rotation             Shoulder external rotation             Elbow flexion [facility] WFL 145 145 145 145 145 147  Elbow extension [facility] WFL 10(0) 10(0) WFL WFL [facility] WFL   Wrist flexion WFL TBD [facility] WFL   Wrist extension WFL TBD [facility] WFL WFL   Wrist ulnar deviation             Wrist radial deviation             Wrist pronation             Wrist supination             (Blank rows = not tested)     UPPER EXTREMITY MMT:      MMT Right eval Left eval Left 10/09/2022 Left 10/23/2022 Left 12/02/2022 Left 01/13/2023 Left 03/10/2023 Left 04/21/23  Shoulder flexion 4+/5 2+/5 1/5 2-/5 2-/5 2-/5 2-/5 2-/5  Shoulder abduction 4+/5 2+/5 2-/5 2-/5 2-/5 2-/5 2-/5 2-/5  Shoulder adduction            Shoulder extension            Shoulder internal rotation            Shoulder external rotation            Middle trapezius            Lower trapezius            Elbow flexion 4+/5 3+/5 3/5 3/5 3+/5 3+/5 4/5 4/5  Elbow extension 4+/5 3+/5 3/5 3/5 3+/5 3+/5 4/5 4/5  Wrist flexion 4+/5 TBD 3/5 3/5 3+/5 3+/5 4/5 4/5  Wrist extension 4+/5 TBD 3/5 3/5 3+/5 3+/5 4/5 4/5  Wrist ulnar deviation            Wrist radial deviation            Wrist pronation            Wrist supination            (Blank rows = not tested)   HAND FUNCTION: Grip strength: Right: 33 lbs; Left: 11 lbs, Lateral pinch: Right: 17 lbs, Left: 2 lbs, and 3 point pinch: Right: 17 lbs, Left: 2 lbs  10/09/2022: Grip  strength: Right: 33 lbs; Left: 10 lbs, Lateral pinch: Right: 17 lbs, Left: 1 lbs, and 3 point pinch: Right: 17 lbs, Left: 1 lbs   10/23/2022: Grip strength: Right: 33 lbs; Left: 16 lbs, Lateral pinch: Right: 17 lbs, Left: 1 lbs, and 3 point pinch: Right: 17 lbs, Left: 1 lbs  12/02/2022: Grip strength: Right: 33 lbs; Left: 17 lbs, Lateral pinch: Right: 17 lbs, Left: 7 lbs, and 3 point pinch: Right: 17 lbs, Left: 3 lbs   01/13/2023: Grip strength: Right: 33 lbs; Left: 15 lbs, Lateral pinch: Right: 17 lbs, Left: 9 lbs, and 3 point pinch: Right: 17 lbs, Left: 7 lbs  03/10/2023: Grip strength: Right: 33 lbs; Left: 15 lbs, Lateral pinch: Right: 17 lbs, Left: 9 lbs, and 3 point pinch: Right: 17 lbs, Left: 7 lbs   04/21/2023: Grip strength: Right: 34 lbs; Left: 13 lbs, Lateral pinch: NT 3 point pinch: Right: NT   COORDINATION:   9 Hole Peg test: Right: 27 sec.  sec; Left: 52 sec.  10/09/2022: 9 Hole Peg test: Right: 27 sec.  sec; Left: 1 min. & 35 sec.  01/13/2023: 9 Hole Peg test: Right: 27 sec.  sec; Left: 51. Sec.  03/10/2023: 9 Hole Peg test: Right: 27 sec.  sec; Left: 54 Sec.  04/21/2023: 9 Hole Peg test: Right: 27 sec.  sec; Left:  46 Sec.  SENSATION: Light touch: WFL Proprioception: WFL   COGNITION: Overall cognitive status: Within functional limits for tasks assessed   VISION: Subjective report: Glasses. Just received a new prescription to increase bifocal strength Baseline vision: Macular Degeneration Visual history: macular degeneration   VISION ASSESSMENT: To be further assessed in functional context  Left sided Awareness      PERCEPTION: Limited left sided awareness   TODAY'S TREATMENT:   Therapeutic Exercise:   Pt. performed left shoulder flexion, and abduction at the tabletop surface. Pt. performed AAROM/AROM/PROM to the left shoulder 2/2 weakness, limited ROM, and stiffness. Pt. worked on [employer], and reciprocal motion using the UBE while seated for 8 min. with  no resistance. Constant monitoring was provided.     Manual Therapy:   Pt. tolerated scapular mobilizations in elevation, depression, abduction/rotation 2/2 tightness. Manual therapy was performed independent of, and in preparation for ROM.     Neuromuscular re-education:   Pt. worked on [facility] skills grasping 1" sticks, 1/4" collars, and 1/4" washers. Pt. worked on storing the objects in the palm, and translatory skills moving the items from the palm of the hand to the tip of the 2nd digit, and thumb. Pt. worked on removing the pegs using bilateral alternating hand patterns.    PATIENT EDUCATION: Education details: BUE strengthening/flexibility exercises Person educated: Patient Education method: verbal cues Education comprehension: verbalized and demonstrated understanding; further training needed.    HOME EXERCISE PROGRAM:  Reviewed home activities to enhance left hand digit extension during typing skills.   GOALS: Goals reviewed with patient? Yes     SHORT TERM GOALS: Target date: 04/21/2023    Pt. Will improve FOTO score by 2 points to reflect improved pt. perceived functional performance Baseline: 04/21/2023: 49 03/10/2023: 59 01/13/2023: 49 12/01/2022: FOTO: 51 10/23/2022: FOTO 49 10/09/2022: FOTO 49, FOTO: 61 TR score: 62  Goal status: INITIAL   LONG TERM GOALS: Target date: 06/02/2023    Pt. Will improve left shoulder ROM by 10 degrees to be able able to efficiently apply deodorant Baseline:  04/21/2023: shoulder flexion: 40(87), Abduction: 48(76) 03/10/2023: Shoulder flexion: 35(85) 01/13/2023:  Shoulder flexion: 35(82), Abduction: 47(70) 12/02/2022: Shoulder flexion: 32(82), Abduction: 45(60)  10/23/2022: Shoulder flexion: 26(80), Abduction: 32(50) 10/09/2022: flexion: 0(55), Abduction: 32(54) Goal status: Improving, ongoing   2.  Pt. will be able to independently hold and use a blow dryer, and brush for hair care. Baseline: 04/21/2023: Pt. is able to hold the hair dryer in the  left hand, however is unable to reach up to dry it, and has difficulty using a brush with the left UE. 03/10/2023: Pt. continues to be able to hold the hair dryer in the left hand, however is unable to reach up to dry it, and has difficulty using a brush 01/13/2023: Pt. Can hold the hair dryer in the left hand, however is unable to reach up to dry it, and has difficulty using a brush. 12/02/2022: Pt. is unable to actively elevate he left shoulder in order to blow dry her hair. 10/23/2022: Pt. continues to be unable to hold a blow dryer. Eval: Pt. Is unable to sustain her BUEs in elevation, and use a blow dryer, and brush. 10/09/2022: Pt. Is unable to hold a blow dryer. Eval: Pt. Is unable to sustain her BUEs in elevation, and use a blow dryer, and brush. Goal status: Ongoing   3.  Pt. Will improve left lateral pinch strength by 3# to be able to independently cut meat. Baseline: 04/21/2023:  NT(Pinch Meter sent out for calibration) Pt. continues to have difficulty cutting meat. 03/10/2023: Left lateral pinch strength: 9#   01/13/2023: Left lateral pinch strength: 9#  12/02/2022: Left pinch: 7# Pt. has difficulty cutting meat. 10/23/2022: Pt. Continues to present with difficulty cutting meat. 10/09/2022: 1#  Eval: 2#. Pt. has difficulty stabilizing utensil, and food while cutting food. Goal status: Ongoing   4.  Pt. Will improve Left hand [facility] skills to be able to be able to independently, and efficiently manipulate small objects during ADL tasks. Baseline 04/21/2023:  46 sec. 03/10/2023: 54 sec. 01/13/2023: 51 sec. 12/02/2022: TBD  10/23/2022: Pt. Continues to present with difficulty manipulating small objects. 10/09/2022: Left: 1 min & 35 sec.: Eval: Left: 52 sec. Right: 27 sec. Goal status: Improving, Ongoing     6.: Pt. will improve with left grip strength to be able to hold and pull up pants Baseline: 04/21/2023: Pt. is able to pull up covers, however has difficulty pulling up pants. 03/10/2023: left  grip strength:  15# 01/13/2023: left  grip strength: 15# 12/02/2022: 17# 10/23/2022: Left: 16# 10/09/2022: Left: 10# Eval:  Left grip strength 11#. Pt. has difficulty holding a full drink  securely with the left hand.             Goal status: Improved, Revised to pants.   7. Pt. will improve typing speed, and accuracy in preparation for efficiently typing simple email message. Baseline: 04/21/2023:  Pt. continues to present with limited speed, and accuracy with typing  03/10/2023: Pt. continues to present with limited speed, and accuracy with typing 01/13/2023: TBD 12/02/2022:  Pt. continues to present with limited speed, and accuracy with typing.10/23/2022: Pt. continues to present with limited speed, and accuracy with typing. 10/09/2022: Pt. presents with difficulty typing. Pt. presents with difficulty typing an email. Typing speed, and accuracy TBD             Goal status: Improved                                CLINICAL IMPRESSION:  Pt. reports that she is planning to ride a train to [place] area to celebrate her grandchild's birthday. Pt. continues to report not having any pain in the left shoulder since receiving the injections. Pt. continues to present with improved tolerance with, and increased ROM with the left shoulder. Pt. continues to present with improved AROM in the LUE. Pt. is now able to use her LUE for reaching up to apply shampoo to the left side of the head, and reaching up to place a can of soup on a kitchen shelf. Pt. presented with decreased compensation proximally with hiking in the left shoulder during exercises. Pt. continues to benefit from OT services to work towards improving Left UE functioning in order to maximize engagement in, and overall independence with ADLs, and IADL tasks.     PERFORMANCE DEFICITS in functional skills including ADLs, IADLs, coordination, dexterity, sensation, ROM, strength, FMC, decreased knowledge of use of DME, vision, and UE functional use, cognitive skills  including attention, and psychosocial skills including coping strategies, environmental adaptation, habits, and routines and behaviors.    IMPAIRMENTS are limiting patient from ADLs, IADLs, and social participation.    COMORBIDITIES may have co-morbidities  that affects occupational performance. Patient will benefit from skilled OT to address above impairments and improve overall function.   MODIFICATION OR ASSISTANCE TO COMPLETE EVALUATION: Min-Moderate modification of tasks or  assist with assess necessary to complete an evaluation.   OT OCCUPATIONAL PROFILE AND HISTORY: Detailed assessment: Review of records and additional review of physical, cognitive, psychosocial history related to current functional performance.   CLINICAL DECISION MAKING: Moderate - several treatment options, min-mod task modification necessary   REHAB POTENTIAL: Good   EVALUATION COMPLEXITY: Moderate      PLAN: OT FREQUENCY: 2x/week   OT DURATION: 12 weeks   PLANNED INTERVENTIONS: self care/ADL training, therapeutic exercise, therapeutic activity, neuromuscular re-education, manual therapy, passive range of motion, and paraffin   RECOMMENDED OTHER SERVICES: ST, and PT   CONSULTED AND AGREED WITH PLAN OF CARE: Patient   PLAN FOR NEXT SESSION:  Left hand strengthening and coordination exercises   Olegario Messier, MS, OTR/L   05/26/2023

## 2023-05-28 ENCOUNTER — Ambulatory Visit: Payer: Medicare HMO | Admitting: Occupational Therapy

## 2023-05-28 ENCOUNTER — Ambulatory Visit: Payer: Medicare HMO | Admitting: Physical Therapy

## 2023-05-28 DIAGNOSIS — R278 Other lack of coordination: Secondary | ICD-10-CM

## 2023-05-28 DIAGNOSIS — M6281 Muscle weakness (generalized): Secondary | ICD-10-CM

## 2023-05-28 NOTE — Therapy (Signed)
Occupational Therapy Neuro Treatment Note   Patient Name: Alyssa Mayo MRN: [id number] DOB:[birth year], 77 y.o., female Today's Date: 03/14/2022   PCP: [name], MD REFERRING PROVIDER: [name], NP     OT End of Session - 05/28/23 1049     Visit Number 56    Number of Visits 72    Date for OT Re-Evaluation 06/02/23    Authorization Type Progress reporting period starting 01/13/2023    OT Start Time 1020    OT Stop Time 1100    OT Time Calculation (min) 40 min    Activity Tolerance Patient tolerated treatment well    Behavior During Therapy [facility] for tasks assessed/performed                                        Past Medical History:  Diagnosis Date   Abnormal levels of other serum enzymes 07/17/2015   Arthritis     Constipation 07/11/2015   COVID-19 03/06/2021   Diabetes mellitus without complication (HCC)     Elevated liver enzymes     Fatty liver     Generalized abdominal pain 07/11/2015   Hyperlipidemia     Hypertension     Lifelong obesity     Macular degeneration     Morbid obesity due to excess calories (HCC) 07/11/2015   Other fatigue 07/11/2015   Sleep apnea     Spinal headache           Past Surgical History:  Procedure Laterality Date   ABDOMINAL HYSTERECTOMY       APPENDECTOMY       CATARACT EXTRACTION       CERVICAL LAMINECTOMY   07/21/1985   CESAREAN SECTION       COLONOSCOPY   07/21/2012    diverticulosis, [facility] Dr. [name]    COLONOSCOPY WITH PROPOFOL N/A 02/04/2019    Procedure: COLONOSCOPY WITH PROPOFOL;  Surgeon: [name], MD;  Location: [facility] ENDOSCOPY;  Service: Endoscopy;  Laterality: N/A;   COLONOSCOPY WITH PROPOFOL N/A 03/18/2021    Procedure: COLONOSCOPY WITH PROPOFOL;  Surgeon: [name], MD;  Location: [facility] ENDOSCOPY;  Service: Endoscopy;  Laterality: N/A;  DM   DIAGNOSTIC LAPAROSCOPY       ESOPHAGOGASTRODUODENOSCOPY (EGD) WITH PROPOFOL N/A 02/04/2019    Procedure:  ESOPHAGOGASTRODUODENOSCOPY (EGD) WITH PROPOFOL;  Surgeon: [name], MD;  Location: [facility] ENDOSCOPY;  Service: Endoscopy;  Laterality: N/A;   ESOPHAGOGASTRODUODENOSCOPY (EGD) WITH PROPOFOL N/A 03/18/2021    Procedure: ESOPHAGOGASTRODUODENOSCOPY (EGD) WITH PROPOFOL;  Surgeon: [name], MD;  Location: [facility] ENDOSCOPY;  Service: Endoscopy;  Laterality: N/A;   LOOP RECORDER INSERTION N/A 08/05/2021    Procedure: LOOP RECORDER INSERTION;  Surgeon: [name], MD;  Location: [facility] INVASIVE CV LAB;  Service: Cardiovascular;  Laterality: N/A;   SPINE SURGERY       TUBAL LIGATION            Patient Active Problem List    Diagnosis Date Noted   Paroxysmal atrial fibrillation (HCC) 08/08/2022   Hypercoagulable state due to paroxysmal atrial fibrillation (HCC) 08/08/2022   Cerebral edema (HCC) 08/07/2021   OSA (obstructive sleep apnea) 08/07/2021   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   CVA (cerebral vascular accident) (HCC) 08/01/2021   GERD (gastroesophageal reflux disease) 07/20/2019   Advanced care planning/counseling discussion 12/17/2016   Skin lesions, generalized 11/13/2016   Chronic right  hip pain 06/17/2016   Vitamin D deficiency 09/26/2015   Diastasis recti 08/08/2015   Elevated serum GGT level 07/24/2015   Essential hypertension 07/17/2015   Fatty liver 07/17/2015   Elevated alkaline phosphatase level 07/17/2015   Elevated serum glutamic pyruvic transaminase (SGPT) level 07/17/2015   Abnormal finding on EKG 07/11/2015   Morbid obesity (HCC) 07/11/2015   Colon, diverticulosis 07/11/2015   Abdominal wall hernia 07/11/2015   DM (diabetes mellitus), type 2 with neurological complications (HCC)     Hyperlipidemia      ONSET DATE: 08/01/2021   REFERRING DIAG: CVA   THERAPY DIAG:  Muscle weakness (generalized)   Rationale for Evaluation and Treatment Rehabilitation   SUBJECTIVE:    SUBJECTIVE STATEMENT:      Pt. is preparing to go to  [place] by train this weekend for her granddaughter's birthday.   Pt accompanied by: self   PERTINENT HISTORY:  Pt. is a 77 y.o. female who had a unexpectedly hospitalized from 2/12-2/14/2024 for COVID-19, and a UTI.  Pt. 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 Pt. attended inpatient rehab from 08/07/2021-08/22/2021. Pt. received Home health therapy services this past spring.  Pt. has recently had an assessment through driver rehabilitation services at [facility] with recommendations for referrals for  outpatient OT/PT, and ST services. Pt. PMHx includes: HTN, Hyperlipidemia, Macular degeneration, DM, and Obesity.   PRECAUTIONS: None   WEIGHT BEARING RESTRICTIONS No   PAIN:  Are you having pain?  0/10    FALLS: Has patient fallen in last 6 months? Yes.   LIVING ENVIRONMENT: Lives with: lives with their family  Son  [name] Lives in: House/apartment Main living are on one floor Stairs:  2 steps to enter Has following equipment at home: Single point cane, Wheelchair (manual), shower chair, Grab bars, bed rail, and rubber mat.   PLOF: Independent   PATIENT GOALS: To be able to Drive, and cook again   OBJECTIVE:    HAND DOMINANCE: Right    ADLs:  Transfers/ambulation related to ADLs: Independent Eating: Independent Grooming: [employer] Pt. reports being unable to use her left hand to perform haircare.  UB Dressing: Independent pullover shirt, independent now with fastening a bra LB Dressing: Independent donning pants socks, and slide on shoes Toileting: Independent Bathing: Independent Tub Shower transfers: Walk-in shower Independent now    IADLs: Shopping: Needs to be accompanied to the grocery store. Light housekeeping: Do  Meal Prep:  Is able to perform light meal prep Community mobility: Relies on son, and friend Medication management: Son sets up Mining engineer management:TBD Handwriting: TBD   MOBILITY STATUS:  Hx of falls out of bed     ACTIVITY TOLERANCE: Activity tolerance:  10-20 min. Before rest break   FUNCTIONAL OUTCOME MEASURES: FOTO: 61  TR score: 62  10/09/2022: FOTO: 49   UPPER EXTREMITY ROM      Active ROM Right eval Left eval Left  10/09/2022 Left 10/23/22 Left 12/02/2022 Left 01/13/2023 Left  03/10/2023 Left 04/21/23 Left 05/12/23  Shoulder flexion WFL 54(74) scaption 0(55) 26(80) 32(82) 35(82) 35(85) 40(87) 70(96)  Shoulder abduction WFL 58(75) 32(54) 32(50) 45(60) 47(70) 62(72) 48(76) 73(102)  Shoulder adduction             Shoulder extension             Shoulder internal rotation             Shoulder external rotation  Elbow flexion [facility] WFL 145 145 145 145 145 147   Elbow extension [facility] WFL 10(0) 10(0) WFL WFL [facility] WFL   Wrist flexion WFL TBD [facility] WFL   Wrist extension WFL TBD [facility] WFL WFL   Wrist ulnar deviation             Wrist radial deviation             Wrist pronation             Wrist supination             (Blank rows = not tested)     UPPER EXTREMITY MMT:      MMT Right eval Left eval Left 10/09/2022 Left 10/23/2022 Left 12/02/2022 Left 01/13/2023 Left 03/10/2023 Left 04/21/23  Shoulder flexion 4+/5 2+/5 1/5 2-/5 2-/5 2-/5 2-/5 2-/5  Shoulder abduction 4+/5 2+/5 2-/5 2-/5 2-/5 2-/5 2-/5 2-/5  Shoulder adduction            Shoulder extension            Shoulder internal rotation            Shoulder external rotation            Middle trapezius            Lower trapezius            Elbow flexion 4+/5 3+/5 3/5 3/5 3+/5 3+/5 4/5 4/5  Elbow extension 4+/5 3+/5 3/5 3/5 3+/5 3+/5 4/5 4/5  Wrist flexion 4+/5 TBD 3/5 3/5 3+/5 3+/5 4/5 4/5  Wrist extension 4+/5 TBD 3/5 3/5 3+/5 3+/5 4/5 4/5  Wrist ulnar deviation            Wrist radial deviation            Wrist pronation            Wrist supination            (Blank rows = not tested)   HAND FUNCTION: Grip strength: Right: 33 lbs; Left: 11 lbs, Lateral pinch:  Right: 17 lbs, Left: 2 lbs, and 3 point pinch: Right: 17 lbs, Left: 2 lbs  10/09/2022: Grip strength: Right: 33 lbs; Left: 10 lbs, Lateral pinch: Right: 17 lbs, Left: 1 lbs, and 3 point pinch: Right: 17 lbs, Left: 1 lbs   10/23/2022: Grip strength: Right: 33 lbs; Left: 16 lbs, Lateral pinch: Right: 17 lbs, Left: 1 lbs, and 3 point pinch: Right: 17 lbs, Left: 1 lbs  12/02/2022: Grip strength: Right: 33 lbs; Left: 17 lbs, Lateral pinch: Right: 17 lbs, Left: 7 lbs, and 3 point pinch: Right: 17 lbs, Left: 3 lbs   01/13/2023: Grip strength: Right: 33 lbs; Left: 15 lbs, Lateral pinch: Right: 17 lbs, Left: 9 lbs, and 3 point pinch: Right: 17 lbs, Left: 7 lbs  03/10/2023: Grip strength: Right: 33 lbs; Left: 15 lbs, Lateral pinch: Right: 17 lbs, Left: 9 lbs, and 3 point pinch: Right: 17 lbs, Left: 7 lbs   04/21/2023: Grip strength: Right: 34 lbs; Left: 13 lbs, Lateral pinch: NT 3 point pinch: Right: NT   COORDINATION:   9 Hole Peg test: Right: 27 sec.  sec; Left: 52 sec.  10/09/2022: 9 Hole Peg test: Right: 27 sec.  sec; Left: 1 min. & 35 sec.  01/13/2023: 9 Hole Peg test: Right: 27 sec.  sec; Left: 51. Sec.  03/10/2023: 9 Hole Peg test: Right: 27 sec.  sec; Left: 54 Sec.  04/21/2023: 9 Hole Peg test: Right: 27 sec.  sec; Left: 46 Sec.  SENSATION: Light touch: WFL Proprioception: WFL   COGNITION: Overall cognitive status: Within functional limits for tasks assessed   VISION: Subjective report: Glasses. Just received a new prescription to increase bifocal strength Baseline vision: Macular Degeneration Visual history: macular degeneration   VISION ASSESSMENT: To be further assessed in functional context  Left sided Awareness      PERCEPTION: Limited left sided awareness   TODAY'S TREATMENT:   Therapeutic Exercise:   Pt. performed left shoulder flexion, and abduction at the tabletop surface. Pt. performed AAROM/AROM to the left shoulder 2/2 weakness, limited ROM, and stiffness. Pt. worked  on [employer], and reciprocal motion using the UBE while seated for 8 min. with no resistance. Constant monitoring was provided.    Manual Therapy:   Pt. tolerates soft tissue massage to the scapular, and shoulder musculature. Scapular mobilizations in elevation, depression, abduction/rotation 2/2 tightness. Manual therapy was performed independent of, and in preparation for ROM.      Neuromuscular re-education:   Pt. worked on [facility] skills using the [employer] Task. Pt. worked on sustaining grasp on the resistive tweezers while grasping the sticks, and moving them from a horizontal position to a vertical position to prepare for placing them into the pegboard positioned flat at the tabletop surface. Pt. required verbal cues, and cues for visual demonstration for wrist position, and hand pattern when placing them into the pegboard.    PATIENT EDUCATION: Education details: BUE strengthening/flexibility exercises Person educated: Patient Education method: verbal cues Education comprehension: verbalized and demonstrated understanding; further training needed.    HOME EXERCISE PROGRAM:  Reviewed home activities to enhance left hand digit extension during typing skills.   GOALS: Goals reviewed with patient? Yes     SHORT TERM GOALS: Target date: 04/21/2023    Pt. Will improve FOTO score by 2 points to reflect improved pt. perceived functional performance Baseline: 04/21/2023: 49 03/10/2023: 59 01/13/2023: 49 12/01/2022: FOTO: 51 10/23/2022: FOTO 49 10/09/2022: FOTO 49, FOTO: 61 TR score: 62  Goal status: INITIAL   LONG TERM GOALS: Target date: 06/02/2023    Pt. Will improve left shoulder ROM by 10 degrees to be able able to efficiently apply deodorant Baseline:  04/21/2023: shoulder flexion: 40(87), Abduction: 48(76) 03/10/2023: Shoulder flexion: 35(85) 01/13/2023:  Shoulder flexion: 35(82), Abduction: 47(70) 12/02/2022: Shoulder flexion: 32(82), Abduction: 45(60)  10/23/2022:  Shoulder flexion: 26(80), Abduction: 32(50) 10/09/2022: flexion: 0(55), Abduction: 32(54) Goal status: Improving, ongoing   2.  Pt. will be able to independently hold and use a blow dryer, and brush for hair care. Baseline: 04/21/2023: Pt. is able to hold the hair dryer in the left hand, however is unable to reach up to dry it, and has difficulty using a brush with the left UE. 03/10/2023: Pt. continues to be able to hold the hair dryer in the left hand, however is unable to reach up to dry it, and has difficulty using a brush 01/13/2023: Pt. Can hold the hair dryer in the left hand, however is unable to reach up to dry it, and has difficulty using a brush. 12/02/2022: Pt. is unable to actively elevate he left shoulder in order to blow dry her hair. 10/23/2022: Pt. continues to be unable to hold a blow dryer. Eval: Pt. Is unable to sustain her BUEs in elevation, and use a blow dryer, and brush. 10/09/2022: Pt. Is unable to hold a blow dryer. Eval: Pt. Is unable to sustain  her BUEs in elevation, and use a blow dryer, and brush. Goal status: Ongoing   3.  Pt. Will improve left lateral pinch strength by 3# to be able to independently cut meat. Baseline: 04/21/2023: NT(Pinch Meter sent out for calibration) Pt. continues to have difficulty cutting meat. 03/10/2023: Left lateral pinch strength: 9#   01/13/2023: Left lateral pinch strength: 9#  12/02/2022: Left pinch: 7# Pt. has difficulty cutting meat. 10/23/2022: Pt. Continues to present with difficulty cutting meat. 10/09/2022: 1#  Eval: 2#. Pt. has difficulty stabilizing utensil, and food while cutting food. Goal status: Ongoing   4.  Pt. Will improve Left hand [facility] skills to be able to be able to independently, and efficiently manipulate small objects during ADL tasks. Baseline 04/21/2023:  46 sec. 03/10/2023: 54 sec. 01/13/2023: 51 sec. 12/02/2022: TBD  10/23/2022: Pt. Continues to present with difficulty manipulating small objects. 10/09/2022: Left: 1 min & 35 sec.:  Eval: Left: 52 sec. Right: 27 sec. Goal status: Improving, Ongoing     6.: Pt. will improve with left grip strength to be able to hold and pull up pants Baseline: 04/21/2023: Pt. is able to pull up covers, however has difficulty pulling up pants. 03/10/2023: left  grip strength: 15# 01/13/2023: left  grip strength: 15# 12/02/2022: 17# 10/23/2022: Left: 16# 10/09/2022: Left: 10# Eval:  Left grip strength 11#. Pt. has difficulty holding a full drink  securely with the left hand.             Goal status: Improved, Revised to pants.   7. Pt. will improve typing speed, and accuracy in preparation for efficiently typing simple email message. Baseline: 04/21/2023:  Pt. continues to present with limited speed, and accuracy with typing  03/10/2023: Pt. continues to present with limited speed, and accuracy with typing 01/13/2023: TBD 12/02/2022:  Pt. continues to present with limited speed, and accuracy with typing.10/23/2022: Pt. continues to present with limited speed, and accuracy with typing. 10/09/2022: Pt. presents with difficulty typing. Pt. presents with difficulty typing an email. Typing speed, and accuracy TBD             Goal status: Improved                                CLINICAL IMPRESSION:  Pt. reports that she is planning to ride a train to [place] area to celebrate her grandchild's birthday this weekend. Pt. continues to report not having any pain in the left shoulder since receiving the injections. Pt. continues to present with improving ROM in the left shoulder. Pt. continues to present with improved UE reaching during ADL, and IADL tasks. Pt. continues to present with compensation proximally with hiking in the left shoulder during exercises, although presents with less compensation. AROM in the LUE. Plan to perform recertification next week. Pt. is now able to use her LUE  Pt. continues to benefit from OT services to work towards improving Left UE functioning in order to maximize engagement in,  and overall independence with ADLs, and IADL tasks.     PERFORMANCE DEFICITS in functional skills including ADLs, IADLs, coordination, dexterity, sensation, ROM, strength, FMC, decreased knowledge of use of DME, vision, and UE functional use, cognitive skills including attention, and psychosocial skills including coping strategies, environmental adaptation, habits, and routines and behaviors.    IMPAIRMENTS are limiting patient from ADLs, IADLs, and social participation.    COMORBIDITIES may have co-morbidities  that affects occupational  performance. Patient will benefit from skilled OT to address above impairments and improve overall function.   MODIFICATION OR ASSISTANCE TO COMPLETE EVALUATION: Min-Moderate modification of tasks or assist with assess necessary to complete an evaluation.   OT OCCUPATIONAL PROFILE AND HISTORY: Detailed assessment: Review of records and additional review of physical, cognitive, psychosocial history related to current functional performance.   CLINICAL DECISION MAKING: Moderate - several treatment options, min-mod task modification necessary   REHAB POTENTIAL: Good   EVALUATION COMPLEXITY: Moderate      PLAN: OT FREQUENCY: 2x/week   OT DURATION: 12 weeks   PLANNED INTERVENTIONS: self care/ADL training, therapeutic exercise, therapeutic activity, neuromuscular re-education, manual therapy, passive range of motion, and paraffin   RECOMMENDED OTHER SERVICES: ST, and PT   CONSULTED AND AGREED WITH PLAN OF CARE: Patient   PLAN FOR NEXT SESSION:  Left hand strengthening and coordination exercises   Olegario Messier, MS, OTR/L   05/28/2023

## 2023-06-02 ENCOUNTER — Ambulatory Visit: Payer: Medicare HMO | Admitting: Physical Therapy

## 2023-06-02 ENCOUNTER — Ambulatory Visit: Payer: Medicare HMO

## 2023-06-02 DIAGNOSIS — I63511 Cerebral infarction due to unspecified occlusion or stenosis of right middle cerebral artery: Secondary | ICD-10-CM

## 2023-06-02 DIAGNOSIS — R278 Other lack of coordination: Secondary | ICD-10-CM

## 2023-06-02 DIAGNOSIS — M6281 Muscle weakness (generalized): Secondary | ICD-10-CM

## 2023-06-02 NOTE — Therapy (Signed)
Occupational Therapy Neuro Recertification Note   Patient Name: Alyssa Mayo MRN: [id number] DOB:12-24-1945, 77 y.o., female Today's Date: 03/14/2022   PCP: [name], MD REFERRING PROVIDER: [name], NP     OT End of Session - 06/02/23 1043     Visit Number 57    Number of Visits 81    Date for OT Re-Evaluation 08/25/23    Authorization Type Progress reporting period starting 04/21/23    OT Start Time 0933    OT Stop Time 1015    OT Time Calculation (min) 42 min    Activity Tolerance Patient tolerated treatment well    Behavior During Therapy [facility] for tasks assessed/performed                     Past Medical History:  Diagnosis Date   Abnormal levels of other serum enzymes 07/17/2015   Arthritis     Constipation 07/11/2015   COVID-19 03/06/2021   Diabetes mellitus without complication (HCC)     Elevated liver enzymes     Fatty liver     Generalized abdominal pain 07/11/2015   Hyperlipidemia     Hypertension     Lifelong obesity     Macular degeneration     Morbid obesity due to excess calories (HCC) 07/11/2015   Other fatigue 07/11/2015   Sleep apnea     Spinal headache           Past Surgical History:  Procedure Laterality Date   ABDOMINAL HYSTERECTOMY       APPENDECTOMY       CATARACT EXTRACTION       CERVICAL LAMINECTOMY   07/21/1985   CESAREAN SECTION       COLONOSCOPY   07/21/2012    diverticulosis, [facility] Dr. [name]    COLONOSCOPY WITH PROPOFOL N/A 02/04/2019    Procedure: COLONOSCOPY WITH PROPOFOL;  Surgeon: [name], MD;  Location: [facility] ENDOSCOPY;  Service: Endoscopy;  Laterality: N/A;   COLONOSCOPY WITH PROPOFOL N/A 03/18/2021    Procedure: COLONOSCOPY WITH PROPOFOL;  Surgeon: [name], MD;  Location: [facility] ENDOSCOPY;  Service: Endoscopy;  Laterality: N/A;  DM   DIAGNOSTIC LAPAROSCOPY       ESOPHAGOGASTRODUODENOSCOPY (EGD) WITH PROPOFOL N/A 02/04/2019    Procedure: ESOPHAGOGASTRODUODENOSCOPY (EGD) WITH PROPOFOL;   Surgeon: [name], MD;  Location: [facility] ENDOSCOPY;  Service: Endoscopy;  Laterality: N/A;   ESOPHAGOGASTRODUODENOSCOPY (EGD) WITH PROPOFOL N/A 03/18/2021    Procedure: ESOPHAGOGASTRODUODENOSCOPY (EGD) WITH PROPOFOL;  Surgeon: [name], MD;  Location: [facility] ENDOSCOPY;  Service: Endoscopy;  Laterality: N/A;   LOOP RECORDER INSERTION N/A 08/05/2021    Procedure: LOOP RECORDER INSERTION;  Surgeon: [name], MD;  Location: [facility] INVASIVE CV LAB;  Service: Cardiovascular;  Laterality: N/A;   SPINE SURGERY       TUBAL LIGATION            Patient Active Problem List    Diagnosis Date Noted   Paroxysmal atrial fibrillation (HCC) 08/08/2022   Hypercoagulable state due to paroxysmal atrial fibrillation (HCC) 08/08/2022   Cerebral edema (HCC) 08/07/2021   OSA (obstructive sleep apnea) 08/07/2021   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   CVA (cerebral vascular accident) (HCC) 08/01/2021   GERD (gastroesophageal reflux disease) 07/20/2019   Advanced care planning/counseling discussion 12/17/2016   Skin lesions, generalized 11/13/2016   Chronic right hip pain 06/17/2016   Vitamin D deficiency 09/26/2015   Diastasis recti 08/08/2015   Elevated serum GGT  level 07/24/2015   Essential hypertension 07/17/2015   Fatty liver 07/17/2015   Elevated alkaline phosphatase level 07/17/2015   Elevated serum glutamic pyruvic transaminase (SGPT) level 07/17/2015   Abnormal finding on EKG 07/11/2015   Morbid obesity (HCC) 07/11/2015   Colon, diverticulosis 07/11/2015   Abdominal wall hernia 07/11/2015   DM (diabetes mellitus), type 2 with neurological complications (HCC)     Hyperlipidemia      ONSET DATE: 08/01/2021   REFERRING DIAG: CVA   THERAPY DIAG:  Muscle weakness (generalized)   Rationale for Evaluation and Treatment Rehabilitation   SUBJECTIVE:   SUBJECTIVE STATEMENT:      Pt reports being exhausted from her travels to [place] this weekend to see family,  but that she had a good trip. Pt accompanied by: self   PERTINENT HISTORY:  Pt. is a 77 y.o. female who had a unexpectedly hospitalized from 2/12-2/14/2024 for COVID-19, and a UTI.  Pt. 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 Pt. attended inpatient rehab from 08/07/2021-08/22/2021. Pt. received Home health therapy services this past spring.  Pt. has recently had an assessment through driver rehabilitation services at [facility] with recommendations for referrals for  outpatient OT/PT, and ST services. Pt. PMHx includes: HTN, Hyperlipidemia, Macular degeneration, DM, and Obesity.   PRECAUTIONS: None   WEIGHT BEARING RESTRICTIONS No   PAIN: Are you having pain?  06/02/23: 2/10 in bilat hips and legs from recent travel; no pain in LUE  FALLS: Has patient fallen in last 6 months? Yes.   LIVING ENVIRONMENT: Lives with: lives with their family  Son  [name] Lives in: House/apartment Main living are on one floor Stairs:  2 steps to enter Has following equipment at home: Single point cane, Wheelchair (manual), shower chair, Grab bars, bed rail, and rubber mat.   PLOF: Independent   PATIENT GOALS: To be able to Drive, and cook again   OBJECTIVE:    HAND DOMINANCE: Right    ADLs:  Transfers/ambulation related to ADLs: Independent Eating: Independent Grooming: [employer] Pt. reports being unable to use her left hand to perform haircare.  UB Dressing: Independent pullover shirt, independent now with fastening a bra LB Dressing: Independent donning pants socks, and slide on shoes Toileting: Independent Bathing: Independent Tub Shower transfers: Walk-in shower Independent now    IADLs: Shopping: Needs to be accompanied to the grocery store. Light housekeeping: Do  Meal Prep:  Is able to perform light meal prep Community mobility: Relies on son, and friend Medication management: Son sets up Mining engineer management:TBD Handwriting:  TBD   MOBILITY STATUS: Hx of falls out of bed     ACTIVITY TOLERANCE: Activity tolerance:  10-20 min. Before rest break   FUNCTIONAL OUTCOME MEASURES: FOTO: 61  TR score: 62  10/09/2022: FOTO: 49  (Recert) 06/02/23: 65   UPPER EXTREMITY ROM      Active ROM Right eval Left eval Left  10/09/2022 Left 10/23/22 Left 12/02/2022 Left 01/13/2023 Left  03/10/2023 Left 04/21/23 Left 05/12/23 Left 06/02/23  Shoulder flexion WFL 54(74) scaption 0(55) 26(80) 32(82) 35(82) 35(85) 40(87) 70(96) 77 (104)  Shoulder abduction WFL 58(75) 32(54) 32(50) 45(60) 47(70) 62(72) 48(76) 73(102) 70 (100)  Shoulder adduction              Shoulder extension              Shoulder internal rotation            Thumb to L3 (T10)  Shoulder external rotation            (  With elbow at side) 45 (60)  Elbow flexion [facility] WFL 145 145 145 145 145 147  WFL  Elbow extension [facility] WFL 10(0) 10(0) WFL WFL [facility]  WFL  Wrist flexion WFL TBD [facility]  WFL  Wrist extension WFL TBD [facility] WFL [facility] WFL  WFL  Wrist ulnar deviation              Wrist radial deviation              Wrist pronation              Wrist supination              (Blank rows = not tested)     UPPER EXTREMITY MMT:      MMT Right eval Left eval Left 10/09/2022 Left 10/23/2022 Left 12/02/2022 Left 01/13/2023 Left 03/10/2023 Left 04/21/23 Left 06/02/23  Shoulder flexion 4+/5 2+/5 1/5 2-/5 2-/5 2-/5 2-/5 2-/5 2-  Shoulder abduction 4+/5 2+/5 2-/5 2-/5 2-/5 2-/5 2-/5 2-/5 2-  Shoulder adduction             Shoulder extension             Shoulder internal rotation           4-  Shoulder external rotation           3-  Middle trapezius             Lower trapezius             Elbow flexion 4+/5 3+/5 3/5 3/5 3+/5 3+/5 4/5 4/5 4+/5  Elbow extension 4+/5 3+/5 3/5 3/5 3+/5 3+/5 4/5 4/5 4/5  Wrist flexion 4+/5 TBD 3/5 3/5 3+/5 3+/5 4/5 4/5 4+/5  Wrist extension 4+/5 TBD 3/5 3/5 3+/5 3+/5 4/5 4/5 4/5  Wrist ulnar deviation              Wrist radial deviation             Wrist pronation             Wrist supination             (Blank rows = not tested)   HAND FUNCTION: Grip strength: Right: 33 lbs; Left: 11 lbs, Lateral pinch: Right: 17 lbs, Left: 2 lbs, and 3 point pinch: Right: 17 lbs, Left: 2 lbs  10/09/2022: Grip strength: Right: 33 lbs; Left: 10 lbs, Lateral pinch: Right: 17 lbs, Left: 1 lbs, and 3 point pinch: Right: 17 lbs, Left: 1 lbs   10/23/2022: Grip strength: Right: 33 lbs; Left: 16 lbs, Lateral pinch: Right: 17 lbs, Left: 1 lbs, and 3 point pinch: Right: 17 lbs, Left: 1 lbs  12/02/2022: Grip strength: Right: 33 lbs; Left: 17 lbs, Lateral pinch: Right: 17 lbs, Left: 7 lbs, and 3 point pinch: Right: 17 lbs, Left: 3 lbs   01/13/2023: Grip strength: Right: 33 lbs; Left: 15 lbs, Lateral pinch: Right: 17 lbs, Left: 9 lbs, and 3 point pinch: Right: 17 lbs, Left: 7 lbs  03/10/2023: Grip strength: Right: 33 lbs; Left: 15 lbs, Lateral pinch: Right: 17 lbs, Left: 9 lbs, and 3 point pinch: Right: 17 lbs, Left: 7 lbs   04/21/2023: Grip strength: Right: 34 lbs; Left: 13 lbs, Lateral pinch: NT 3 point pinch: Right: NT  06/02/23: Grip strength: Right: 34 lbs; Left: 19 lbs, Lateral pinch: Right: 16 lbs; Left: 6 lbs; 3 point pinch: Right: 13 lbs, Left: 7 lbs  COORDINATION:   9 Hole Peg test: Right: 27 sec.  sec; Left: 52 sec.  10/09/2022: 9 Hole Peg test: Right: 27 sec.  sec; Left: 1 min. & 35 sec.  01/13/2023: 9 Hole Peg test: Right: 27 sec.  sec; Left: 51. Sec.  03/10/2023: 9 Hole Peg test: Right: 27 sec.  sec; Left: 54 Sec.  04/21/2023: 9 Hole Peg test: Right: 27 sec.  sec; Left: 46 Sec.  06/02/23: 9 hole Peg test: Left: 47 sec  SENSATION: Light touch: WFL Proprioception: WFL   COGNITION: Overall cognitive status: Within functional limits for tasks assessed   VISION: Subjective report: Glasses. Just received a new prescription to increase bifocal strength Baseline vision: Macular Degeneration Visual history:  macular degeneration   VISION ASSESSMENT: To be further assessed in functional context  Left sided Awareness     PERCEPTION: Limited left sided awareness   TODAY'S TREATMENT:  Therapeutic Exercise: Objective measures taken and goals updated and reviewed for recertification.   Performed gentle passive stretching for L shoulder planes, including flex, abd, ER, and IR, working to increase L shoulder mobility for ADLs and functional reaching above shoulder level.    PATIENT EDUCATION: Education details: progress towards goals Person educated: Patient Education method: verbal cues Education comprehension: verbalized understanding    HOME EXERCISE PROGRAM:  Reviewed home activities to enhance left hand digit extension during typing skills.   GOALS: Goals reviewed with patient? Yes     SHORT TERM GOALS: Target date: 07/21/23   Pt. Will improve FOTO score by 2 points to reflect improved pt. perceived functional performance Baseline: 06/02/23: 65; 04/21/2023: 49 03/10/2023: 59 01/13/2023: 49 12/01/2022: FOTO: 51 10/23/2022: FOTO 49 10/09/2022: FOTO 49, FOTO: 61 TR score: 62 Goal status: achieved/ongoing   LONG TERM GOALS: Target date: 08/25/2023   Pt. Will improve left shoulder ROM by 10 degrees to be able able to efficiently apply deodorant Baseline: 06/02/23: shoulder flexion 77 (104), abd 70 (100); 04/21/2023: shoulder flexion: 40(87), Abduction: 48(76) 03/10/2023: Shoulder flexion: 35(85) 01/13/2023:  Shoulder flexion: 35(82), Abduction: 47(70) 12/02/2022: Shoulder flexion: 32(82), Abduction: 45(60)  10/23/2022: Shoulder flexion: 26(80), Abduction: 32(50) 10/09/2022: flexion: 0(55), Abduction: 32(54) Goal status: Improving, ongoing   2.  Pt. will be able to independently hold and use a blow dryer, and brush for hair care. Baseline: 06/02/23: Pt can hold a blow dryer but reaches only to L side of head and lower part of back of head with brush and blow dryer d/t limited ER ROM and strength;  04/21/2023: Pt. is able to hold the hair dryer in the left hand, however is unable to reach up to dry it, and has difficulty using a brush with the left UE. 03/10/2023: Pt. continues to be able to hold the hair dryer in the left hand, however is unable to reach up to dry it, and has difficulty using a brush 01/13/2023: Pt. Can hold the hair dryer in the left hand, however is unable to reach up to dry it, and has difficulty using a brush. 12/02/2022: Pt. is unable to actively elevate he left shoulder in order to blow dry her hair. 10/23/2022: Pt. continues to be unable to hold a blow dryer. Eval: Pt. Is unable to sustain her BUEs in elevation, and use a blow dryer, and brush. 10/09/2022: Pt. Is unable to hold a blow dryer. Eval: Pt. Is unable to sustain her BUEs in elevation, and use a blow dryer, and brush. Goal status: improving/ongoing   3.  Pt. Will improve left lateral  pinch strength by 3# to be able to independently cut meat. Baseline: 06/02/23: L lateral pinch: 6# (pt reports she lacks the strength to cut steak but can cut tender chicken); 04/21/2023: NT(Pinch Meter sent out for calibration) Pt. continues to have difficulty cutting meat. 03/10/2023: Left lateral pinch strength: 9#   01/13/2023: Left lateral pinch strength: 9#  12/02/2022: Left pinch: 7# Pt. has difficulty cutting meat. 10/23/2022: Pt. Continues to present with difficulty cutting meat. 10/09/2022: 1#  Eval: 2#. Pt. has difficulty stabilizing utensil, and food while cutting food. Goal status: Ongoing   4.  Pt. Will improve Left hand [facility] skills to be able to be able to independently, and efficiently manipulate small objects during ADL tasks. Baseline: 06/02/23: L 47 sec; 04/21/2023:  46 sec. 03/10/2023: 54 sec. 01/13/2023: 51 sec. 12/02/2022: TBD  10/23/2022: Pt. Continues to present with difficulty manipulating small objects. 10/09/2022: Left: 1 min & 35 sec.: Eval: Left: 52 sec. Right: 27 sec. Goal status: Improving, Ongoing    6.: Pt. will improve  with left grip strength to be able to hold and pull up pants Baseline: 06/02/23: L grip 19#, improving; 04/21/2023: Pt. is able to pull up covers, however has difficulty pulling up pants. 03/10/2023: left  grip strength: 15# 01/13/2023: left  grip strength: 15# 12/02/2022: 17# 10/23/2022: Left: 16# 10/09/2022: Left: 10# Eval:  Left grip strength 11#. Pt. has difficulty holding a full drink  securely with the left hand.             Goal status: Improved, Revised to pants.   7. Pt. will improve typing speed, and accuracy in preparation for efficiently typing simple email message. Baseline: 06/02/23: same as 04/21/23; 04/21/2023:  Pt. continues to present with limited speed, and accuracy with typing  03/10/2023: Pt. continues to present with limited speed, and accuracy with typing 01/13/2023: TBD 12/02/2022:  Pt. continues to present with limited speed, and accuracy with typing.10/23/2022: Pt. continues to present with limited speed, and accuracy with typing. 10/09/2022: Pt. presents with difficulty typing. Pt. presents with difficulty typing an email. Typing speed, and accuracy TBD             Goal status: Improved                                CLINICAL IMPRESSION: Pt seen this date for recertification assessment.  FOTO score has improved from 61 at eval to 65 at recert.  Pt. continues to present with improving ROM in the left shoulder, and reports that she's now able to hold her blow dryer in the L hand, but with limited ability to reach past L side of head with dryer or brush, noting limitations in shoulder flexion, abd, and ER.  Pt reports she's now able to place a can of soup on a shelf at shoulder height.  Pt endorses some continued challenges with hiking her pants behind her back, and doffing her coat.  L grip strength is improving, though still limited in that pt reports inability to stabilize her fork in her L hand to attempt cutting a steak.  Pt states she mostly avoids ordering tougher meat in a restaurant  such as pork chops or steak.  Pt. continues to present with compensation proximally with hiking in the left shoulder during exercises, although presents with less compensation.  Pt. continues to benefit from OT services to work towards improving Left UE functioning in order to maximize engagement  in, and overall independence with ADLs, and IADL tasks, while reducing risk of adhesive capsulitis.     PERFORMANCE DEFICITS in functional skills including ADLs, IADLs, coordination, dexterity, sensation, ROM, strength, FMC, decreased knowledge of use of DME, vision, and UE functional use, cognitive skills including attention, and psychosocial skills including coping strategies, environmental adaptation, habits, and routines and behaviors.    IMPAIRMENTS are limiting patient from ADLs, IADLs, and social participation.    COMORBIDITIES may have co-morbidities  that affects occupational performance. Patient will benefit from skilled OT to address above impairments and improve overall function.   MODIFICATION OR ASSISTANCE TO COMPLETE EVALUATION: Min-Moderate modification of tasks or assist with assess necessary to complete an evaluation.   OT OCCUPATIONAL PROFILE AND HISTORY: Detailed assessment: Review of records and additional review of physical, cognitive, psychosocial history related to current functional performance.   CLINICAL DECISION MAKING: Moderate - several treatment options, min-mod task modification necessary   REHAB POTENTIAL: Good   EVALUATION COMPLEXITY: Moderate      PLAN: OT FREQUENCY: 2x/week   OT DURATION: 12 weeks   PLANNED INTERVENTIONS: self care/ADL training, therapeutic exercise, therapeutic activity, neuromuscular re-education, manual therapy, passive range of motion, and paraffin   RECOMMENDED OTHER SERVICES: ST, and PT   CONSULTED AND AGREED WITH PLAN OF CARE: Patient   PLAN FOR NEXT SESSION:  Left hand strengthening and coordination exercises   Danelle Earthly, MS,  OTR/L   06/02/2023

## 2023-06-03 ENCOUNTER — Other Ambulatory Visit: Payer: Self-pay | Admitting: Physical Medicine and Rehabilitation

## 2023-06-04 ENCOUNTER — Ambulatory Visit: Payer: Medicare HMO | Admitting: Occupational Therapy

## 2023-06-04 ENCOUNTER — Ambulatory Visit: Payer: Medicare HMO | Admitting: Physical Therapy

## 2023-06-04 NOTE — Progress Notes (Signed)
Carelink Summary Report / Loop Recorder 

## 2023-06-09 ENCOUNTER — Ambulatory Visit: Payer: Medicare HMO | Admitting: Physical Therapy

## 2023-06-09 ENCOUNTER — Ambulatory Visit: Payer: Medicare HMO | Admitting: Occupational Therapy

## 2023-06-09 DIAGNOSIS — M6281 Muscle weakness (generalized): Secondary | ICD-10-CM

## 2023-06-09 NOTE — Therapy (Signed)
Occupational Therapy Neuro Treatment Note   Patient Name: Alyssa Mayo MRN: [id number] DOB:[birth year], 77 y.o., female Today's Date: 03/14/2022   PCP: [name], MD REFERRING PROVIDER: [name], NP     OT End of Session - 06/09/23 1713     Visit Number 38    Number of Visits 81    Date for OT Re-Evaluation 08/25/23    Authorization Type Progress reporting period starting 04/21/23    OT Start Time 1015    OT Stop Time 1100    OT Time Calculation (min) 45 min    Activity Tolerance Patient tolerated treatment well    Behavior During Therapy [facility] for tasks assessed/performed                     Past Medical History:  Diagnosis Date   Abnormal levels of other serum enzymes 07/17/2015   Arthritis     Constipation 07/11/2015   COVID-19 03/06/2021   Diabetes mellitus without complication (HCC)     Elevated liver enzymes     Fatty liver     Generalized abdominal pain 07/11/2015   Hyperlipidemia     Hypertension     Lifelong obesity     Macular degeneration     Morbid obesity due to excess calories (HCC) 07/11/2015   Other fatigue 07/11/2015   Sleep apnea     Spinal headache           Past Surgical History:  Procedure Laterality Date   ABDOMINAL HYSTERECTOMY       APPENDECTOMY       CATARACT EXTRACTION       CERVICAL LAMINECTOMY   07/21/1985   CESAREAN SECTION       COLONOSCOPY   07/21/2012    diverticulosis, [facility] Dr. [name]    COLONOSCOPY WITH PROPOFOL N/A 02/04/2019    Procedure: COLONOSCOPY WITH PROPOFOL;  Surgeon: [name], MD;  Location: [facility] ENDOSCOPY;  Service: Endoscopy;  Laterality: N/A;   COLONOSCOPY WITH PROPOFOL N/A 03/18/2021    Procedure: COLONOSCOPY WITH PROPOFOL;  Surgeon: [name], MD;  Location: [facility] ENDOSCOPY;  Service: Endoscopy;  Laterality: N/A;  DM   DIAGNOSTIC LAPAROSCOPY       ESOPHAGOGASTRODUODENOSCOPY (EGD) WITH PROPOFOL N/A 02/04/2019    Procedure: ESOPHAGOGASTRODUODENOSCOPY (EGD) WITH PROPOFOL;   Surgeon: [name], MD;  Location: [facility] ENDOSCOPY;  Service: Endoscopy;  Laterality: N/A;   ESOPHAGOGASTRODUODENOSCOPY (EGD) WITH PROPOFOL N/A 03/18/2021    Procedure: ESOPHAGOGASTRODUODENOSCOPY (EGD) WITH PROPOFOL;  Surgeon: [name], MD;  Location: [facility] ENDOSCOPY;  Service: Endoscopy;  Laterality: N/A;   LOOP RECORDER INSERTION N/A 08/05/2021    Procedure: LOOP RECORDER INSERTION;  Surgeon: [name], MD;  Location: [facility] INVASIVE CV LAB;  Service: Cardiovascular;  Laterality: N/A;   SPINE SURGERY       TUBAL LIGATION            Patient Active Problem List    Diagnosis Date Noted   Paroxysmal atrial fibrillation (HCC) 08/08/2022   Hypercoagulable state due to paroxysmal atrial fibrillation (HCC) 08/08/2022   Cerebral edema (HCC) 08/07/2021   OSA (obstructive sleep apnea) 08/07/2021   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   CVA (cerebral vascular accident) (HCC) 08/01/2021   GERD (gastroesophageal reflux disease) 07/20/2019   Advanced care planning/counseling discussion 12/17/2016   Skin lesions, generalized 11/13/2016   Chronic right hip pain 06/17/2016   Vitamin D deficiency 09/26/2015   Diastasis recti 08/08/2015   Elevated serum GGT  level 07/24/2015   Essential hypertension 07/17/2015   Fatty liver 07/17/2015   Elevated alkaline phosphatase level 07/17/2015   Elevated serum glutamic pyruvic transaminase (SGPT) level 07/17/2015   Abnormal finding on EKG 07/11/2015   Morbid obesity (HCC) 07/11/2015   Colon, diverticulosis 07/11/2015   Abdominal wall hernia 07/11/2015   DM (diabetes mellitus), type 2 with neurological complications (HCC)     Hyperlipidemia      ONSET DATE: 08/01/2021   REFERRING DIAG: CVA   THERAPY DIAG:  Muscle weakness (generalized)   Rationale for Evaluation and Treatment Rehabilitation   SUBJECTIVE:   SUBJECTIVE STATEMENT:       Pt reports  having difficulty reaching her luggage in the overhead compartment on the  train.  Pt accompanied by: self   PERTINENT HISTORY:  Pt. is a 77 y.o. female who had a unexpectedly hospitalized from 2/12-2/14/2024 for COVID-19, and a UTI.  Pt. 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 Pt. attended inpatient rehab from 08/07/2021-08/22/2021. Pt. received Home health therapy services this past spring.  Pt. has recently had an assessment through driver rehabilitation services at [facility] with recommendations for referrals for  outpatient OT/PT, and ST services. Pt. PMHx includes: HTN, Hyperlipidemia, Macular degeneration, DM, and Obesity.   PRECAUTIONS: None   WEIGHT BEARING RESTRICTIONS No   PAIN: Are you having pain? No pain  FALLS: Has patient fallen in last 6 months? Yes.   LIVING ENVIRONMENT: Lives with: lives with their family  Son  [name] Lives in: House/apartment Main living are on one floor Stairs:  2 steps to enter Has following equipment at home: Single point cane, Wheelchair (manual), shower chair, Grab bars, bed rail, and rubber mat.   PLOF: Independent   PATIENT GOALS: To be able to Drive, and cook again   OBJECTIVE:    HAND DOMINANCE: Right    ADLs:  Transfers/ambulation related to ADLs: Independent Eating: Independent Grooming: [employer] Pt. reports being unable to use her left hand to perform haircare.  UB Dressing: Independent pullover shirt, independent now with fastening a bra LB Dressing: Independent donning pants socks, and slide on shoes Toileting: Independent Bathing: Independent Tub Shower transfers: Walk-in shower Independent now    IADLs: Shopping: Needs to be accompanied to the grocery store. Light housekeeping: Do  Meal Prep:  Is able to perform light meal prep Community mobility: Relies on son, and friend Medication management: Son sets up Mining engineer management:TBD Handwriting: TBD   MOBILITY STATUS: Hx of falls out of bed     ACTIVITY TOLERANCE: Activity  tolerance:  10-20 min. Before rest break   FUNCTIONAL OUTCOME MEASURES: FOTO: 61  TR score: 62  10/09/2022: FOTO: 49  (Recert) 06/02/23: 65   UPPER EXTREMITY ROM      Active ROM Right eval Left eval Left  10/09/2022 Left 10/23/22 Left 12/02/2022 Left 01/13/2023 Left  03/10/2023 Left 04/21/23 Left 05/12/23 Left 06/02/23  Shoulder flexion WFL 54(74) scaption 0(55) 26(80) 32(82) 35(82) 35(85) 40(87) 70(96) 77 (104)  Shoulder abduction WFL 58(75) 32(54) 32(50) 45(60) 47(70) 62(72) 48(76) 73(102) 70 (100)  Shoulder adduction              Shoulder extension              Shoulder internal rotation            Thumb to L3 (T10)  Shoulder external rotation            (With elbow at side) 45 (60)  Elbow flexion [facility] WFL 145 145 145 145 145 147  WFL  Elbow extension [facility] WFL 10(0) 10(0) WFL WFL [facility]  WFL  Wrist flexion WFL TBD [facility]  WFL  Wrist extension WFL TBD [facility] WFL [facility] WFL  WFL  Wrist ulnar deviation              Wrist radial deviation              Wrist pronation              Wrist supination              (Blank rows = not tested)     UPPER EXTREMITY MMT:      MMT Right eval Left eval Left 10/09/2022 Left 10/23/2022 Left 12/02/2022 Left 01/13/2023 Left 03/10/2023 Left 04/21/23 Left 06/02/23  Shoulder flexion 4+/5 2+/5 1/5 2-/5 2-/5 2-/5 2-/5 2-/5 2-  Shoulder abduction 4+/5 2+/5 2-/5 2-/5 2-/5 2-/5 2-/5 2-/5 2-  Shoulder adduction             Shoulder extension             Shoulder internal rotation           4-  Shoulder external rotation           3-  Middle trapezius             Lower trapezius             Elbow flexion 4+/5 3+/5 3/5 3/5 3+/5 3+/5 4/5 4/5 4+/5  Elbow extension 4+/5 3+/5 3/5 3/5 3+/5 3+/5 4/5 4/5 4/5  Wrist flexion 4+/5 TBD 3/5 3/5 3+/5 3+/5 4/5 4/5 4+/5  Wrist extension 4+/5 TBD 3/5 3/5 3+/5 3+/5 4/5 4/5 4/5  Wrist ulnar deviation             Wrist radial deviation             Wrist pronation             Wrist supination              (Blank rows = not tested)   HAND FUNCTION: Grip strength: Right: 33 lbs; Left: 11 lbs, Lateral pinch: Right: 17 lbs, Left: 2 lbs, and 3 point pinch: Right: 17 lbs, Left: 2 lbs  10/09/2022: Grip strength: Right: 33 lbs; Left: 10 lbs, Lateral pinch: Right: 17 lbs, Left: 1 lbs, and 3 point pinch: Right: 17 lbs, Left: 1 lbs   10/23/2022: Grip strength: Right: 33 lbs; Left: 16 lbs, Lateral pinch: Right: 17 lbs, Left: 1 lbs, and 3 point pinch: Right: 17 lbs, Left: 1 lbs  12/02/2022: Grip strength: Right: 33 lbs; Left: 17 lbs, Lateral pinch: Right: 17 lbs, Left: 7 lbs, and 3 point pinch: Right: 17 lbs, Left: 3 lbs   01/13/2023: Grip strength: Right: 33 lbs; Left: 15 lbs, Lateral pinch: Right: 17 lbs, Left: 9 lbs, and 3 point pinch: Right: 17 lbs, Left: 7 lbs  03/10/2023: Grip strength: Right: 33 lbs; Left: 15 lbs, Lateral pinch: Right: 17 lbs, Left: 9 lbs, and 3 point pinch: Right: 17 lbs, Left: 7 lbs   04/21/2023: Grip strength: Right: 34 lbs; Left: 13 lbs, Lateral pinch: NT 3 point pinch: Right: NT  06/02/23: Grip strength: Right: 34 lbs; Left: 19 lbs, Lateral pinch: Right: 16 lbs; Left: 6 lbs; 3 point pinch: Right: 13 lbs, Left: 7 lbs    COORDINATION:   9 Hole Peg  test: Right: 27 sec.  sec; Left: 52 sec.  10/09/2022: 9 Hole Peg test: Right: 27 sec.  sec; Left: 1 min. & 35 sec.  01/13/2023: 9 Hole Peg test: Right: 27 sec.  sec; Left: 51. Sec.  03/10/2023: 9 Hole Peg test: Right: 27 sec.  sec; Left: 54 Sec.  04/21/2023: 9 Hole Peg test: Right: 27 sec.  sec; Left: 46 Sec.  06/02/23: 9 hole Peg test: Left: 47 sec  SENSATION: Light touch: WFL Proprioception: WFL   COGNITION: Overall cognitive status: Within functional limits for tasks assessed   VISION: Subjective report: Glasses. Just received a new prescription to increase bifocal strength Baseline vision: Macular Degeneration Visual history: macular degeneration   VISION ASSESSMENT: To be further assessed in functional  context  Left sided Awareness     PERCEPTION: Limited left sided awareness   TODAY'S TREATMENT:   Therapeutic Exercise:   Pt. performed AAROM/AROM to the left shoulder 2/2 weakness, limited ROM, and stiffness. Pt. worked on [employer], and reciprocal motion using the UBE while seated for 8 min. with no resistance. Constant monitoring was provided. Pt. Performed AAROM shoulder flexion repetitions at the  finger ladder for 17 steps.  Therapeutic Activities:  Pt. worked on increasing Left shoulder ROM through functional tasks  reaching to place various sized containers onto shelves at forehead height.  Pt. worked on reaching with the left UE for clothing on hangers removing, and placing the hangers onto the overhead rod. Pt. worked on reaching for bowls on [employer] using bilateral hands.    PATIENT EDUCATION: Education details: progress towards goals Person educated: Patient Education method: verbal cues Education comprehension: verbalized understanding    HOME EXERCISE PROGRAM:  Reviewed home activities to enhance left hand digit extension during typing skills.   GOALS: Goals reviewed with patient? Yes     SHORT TERM GOALS: Target date: 07/21/23   Pt. Will improve FOTO score by 2 points to reflect improved pt. perceived functional performance Baseline: 06/02/23: 65; 04/21/2023: 49 03/10/2023: 59 01/13/2023: 49 12/01/2022: FOTO: 51 10/23/2022: FOTO 49 10/09/2022: FOTO 49, FOTO: 61 TR score: 62 Goal status: achieved/ongoing   LONG TERM GOALS: Target date: 08/25/2023   Pt. Will improve left shoulder ROM by 10 degrees to be able able to efficiently apply deodorant Baseline: 06/02/23: shoulder flexion 77 (104), abd 70 (100); 04/21/2023: shoulder flexion: 40(87), Abduction: 48(76) 03/10/2023: Shoulder flexion: 35(85) 01/13/2023:  Shoulder flexion: 35(82), Abduction: 47(70) 12/02/2022: Shoulder flexion: 32(82), Abduction: 45(60)  10/23/2022: Shoulder flexion: 26(80), Abduction: 32(50)  10/09/2022: flexion: 0(55), Abduction: 32(54) Goal status: Improving, ongoing   2.  Pt. will be able to independently hold and use a blow dryer, and brush for hair care. Baseline: 06/02/23: Pt can hold a blow dryer but reaches only to L side of head and lower part of back of head with brush and blow dryer d/t limited ER ROM and strength; 04/21/2023: Pt. is able to hold the hair dryer in the left hand, however is unable to reach up to dry it, and has difficulty using a brush with the left UE. 03/10/2023: Pt. continues to be able to hold the hair dryer in the left hand, however is unable to reach up to dry it, and has difficulty using a brush 01/13/2023: Pt. Can hold the hair dryer in the left hand, however is unable to reach up to dry it, and has difficulty using a brush. 12/02/2022: Pt. is unable to actively elevate he left shoulder in order to blow dry her  hair. 10/23/2022: Pt. continues to be unable to hold a blow dryer. Eval: Pt. Is unable to sustain her BUEs in elevation, and use a blow dryer, and brush. 10/09/2022: Pt. Is unable to hold a blow dryer. Eval: Pt. Is unable to sustain her BUEs in elevation, and use a blow dryer, and brush. Goal status: improving/ongoing   3.  Pt. Will improve left lateral pinch strength by 3# to be able to independently cut meat. Baseline: 06/02/23: L lateral pinch: 6# (pt reports she lacks the strength to cut steak but can cut tender chicken); 04/21/2023: NT(Pinch Meter sent out for calibration) Pt. continues to have difficulty cutting meat. 03/10/2023: Left lateral pinch strength: 9#   01/13/2023: Left lateral pinch strength: 9#  12/02/2022: Left pinch: 7# Pt. has difficulty cutting meat. 10/23/2022: Pt. Continues to present with difficulty cutting meat. 10/09/2022: 1#  Eval: 2#. Pt. has difficulty stabilizing utensil, and food while cutting food. Goal status: Ongoing   4.  Pt. Will improve Left hand [facility] skills to be able to be able to independently, and efficiently manipulate  small objects during ADL tasks. Baseline: 06/02/23: L 47 sec; 04/21/2023:  46 sec. 03/10/2023: 54 sec. 01/13/2023: 51 sec. 12/02/2022: TBD  10/23/2022: Pt. Continues to present with difficulty manipulating small objects. 10/09/2022: Left: 1 min & 35 sec.: Eval: Left: 52 sec. Right: 27 sec. Goal status: Improving, Ongoing    6.: Pt. will improve with left grip strength to be able to hold and pull up pants Baseline: 06/02/23: L grip 19#, improving; 04/21/2023: Pt. is able to pull up covers, however has difficulty pulling up pants. 03/10/2023: left  grip strength: 15# 01/13/2023: left  grip strength: 15# 12/02/2022: 17# 10/23/2022: Left: 16# 10/09/2022: Left: 10# Eval:  Left grip strength 11#. Pt. has difficulty holding a full drink  securely with the left hand.             Goal status: Improved, Revised to pants.   7. Pt. will improve typing speed, and accuracy in preparation for efficiently typing simple email message. Baseline: 06/02/23: same as 04/21/23; 04/21/2023:  Pt. continues to present with limited speed, and accuracy with typing  03/10/2023: Pt. continues to present with limited speed, and accuracy with typing 01/13/2023: TBD 12/02/2022:  Pt. continues to present with limited speed, and accuracy with typing.10/23/2022: Pt. continues to present with limited speed, and accuracy with typing. 10/09/2022: Pt. presents with difficulty typing. Pt. presents with difficulty typing an email. Typing speed, and accuracy TBD             Goal status: Improved                                CLINICAL IMPRESSION:  Pt. had a follow-up appointment with her orthopedic physician. Pt. continues to report no pain in the left shoulder. Pt. was able to complete 17 steps on the finger ladder with the left UE. Pt. was consistently able to place, and retrieve items from shelves at forehead height.  Pt. continues to benefit from OT services to work towards improving Left UE functioning in order to maximize engagement in, and overall  independence with ADLs, and IADL tasks, while reducing risk of adhesive capsulitis.     PERFORMANCE DEFICITS in functional skills including ADLs, IADLs, coordination, dexterity, sensation, ROM, strength, FMC, decreased knowledge of use of DME, vision, and UE functional use, cognitive skills including attention, and psychosocial skills including coping strategies, environmental  adaptation, habits, and routines and behaviors.    IMPAIRMENTS are limiting patient from ADLs, IADLs, and social participation.    COMORBIDITIES may have co-morbidities  that affects occupational performance. Patient will benefit from skilled OT to address above impairments and improve overall function.   MODIFICATION OR ASSISTANCE TO COMPLETE EVALUATION: Min-Moderate modification of tasks or assist with assess necessary to complete an evaluation.   OT OCCUPATIONAL PROFILE AND HISTORY: Detailed assessment: Review of records and additional review of physical, cognitive, psychosocial history related to current functional performance.   CLINICAL DECISION MAKING: Moderate - several treatment options, min-mod task modification necessary   REHAB POTENTIAL: Good   EVALUATION COMPLEXITY: Moderate      PLAN: OT FREQUENCY: 2x/week   OT DURATION: 12 weeks   PLANNED INTERVENTIONS: self care/ADL training, therapeutic exercise, therapeutic activity, neuromuscular re-education, manual therapy, passive range of motion, and paraffin   RECOMMENDED OTHER SERVICES: ST, and PT   CONSULTED AND AGREED WITH PLAN OF CARE: Patient   PLAN FOR NEXT SESSION:  Left hand strengthening and coordination exercises   Olegario Messier, MS, OTR/L    06/09/2023

## 2023-06-11 ENCOUNTER — Ambulatory Visit: Payer: Medicare HMO | Admitting: Occupational Therapy

## 2023-06-11 ENCOUNTER — Ambulatory Visit: Payer: Medicare HMO | Admitting: Physical Therapy

## 2023-06-11 DIAGNOSIS — M6281 Muscle weakness (generalized): Secondary | ICD-10-CM

## 2023-06-11 NOTE — Therapy (Signed)
Occupational Therapy Neuro Treatment Note   Patient Name: Alyssa Mayo MRN: [id number] DOB:1946-07-01, 77 y.o., female Today's Date: 03/14/2022   PCP: [name], MD REFERRING PROVIDER: [name], NP     OT End of Session - 06/11/23 1227     Visit Number 39    Number of Visits 81    Date for OT Re-Evaluation 08/25/23    Authorization Type Progress reporting period starting 04/21/23    OT Start Time 1018    OT Stop Time 1100    OT Time Calculation (min) 42 min    Activity Tolerance Patient tolerated treatment well    Behavior During Therapy [facility] for tasks assessed/performed                     Past Medical History:  Diagnosis Date   Abnormal levels of other serum enzymes 07/17/2015   Arthritis     Constipation 07/11/2015   COVID-19 03/06/2021   Diabetes mellitus without complication (HCC)     Elevated liver enzymes     Fatty liver     Generalized abdominal pain 07/11/2015   Hyperlipidemia     Hypertension     Lifelong obesity     Macular degeneration     Morbid obesity due to excess calories (HCC) 07/11/2015   Other fatigue 07/11/2015   Sleep apnea     Spinal headache           Past Surgical History:  Procedure Laterality Date   ABDOMINAL HYSTERECTOMY       APPENDECTOMY       CATARACT EXTRACTION       CERVICAL LAMINECTOMY   07/21/1985   CESAREAN SECTION       COLONOSCOPY   07/21/2012    diverticulosis, [facility] Dr. [name]    COLONOSCOPY WITH PROPOFOL N/A 02/04/2019    Procedure: COLONOSCOPY WITH PROPOFOL;  Surgeon: [name], MD;  Location: [facility] ENDOSCOPY;  Service: Endoscopy;  Laterality: N/A;   COLONOSCOPY WITH PROPOFOL N/A 03/18/2021    Procedure: COLONOSCOPY WITH PROPOFOL;  Surgeon: [name], MD;  Location: [facility] ENDOSCOPY;  Service: Endoscopy;  Laterality: N/A;  DM   DIAGNOSTIC LAPAROSCOPY       ESOPHAGOGASTRODUODENOSCOPY (EGD) WITH PROPOFOL N/A 02/04/2019    Procedure: ESOPHAGOGASTRODUODENOSCOPY (EGD) WITH PROPOFOL;   Surgeon: [name], MD;  Location: [facility] ENDOSCOPY;  Service: Endoscopy;  Laterality: N/A;   ESOPHAGOGASTRODUODENOSCOPY (EGD) WITH PROPOFOL N/A 03/18/2021    Procedure: ESOPHAGOGASTRODUODENOSCOPY (EGD) WITH PROPOFOL;  Surgeon: [name], MD;  Location: [facility] ENDOSCOPY;  Service: Endoscopy;  Laterality: N/A;   LOOP RECORDER INSERTION N/A 08/05/2021    Procedure: LOOP RECORDER INSERTION;  Surgeon: [name], MD;  Location: [facility] INVASIVE CV LAB;  Service: Cardiovascular;  Laterality: N/A;   SPINE SURGERY       TUBAL LIGATION            Patient Active Problem List    Diagnosis Date Noted   Paroxysmal atrial fibrillation (HCC) 08/08/2022   Hypercoagulable state due to paroxysmal atrial fibrillation (HCC) 08/08/2022   Cerebral edema (HCC) 08/07/2021   OSA (obstructive sleep apnea) 08/07/2021   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   CVA (cerebral vascular accident) (HCC) 08/01/2021   GERD (gastroesophageal reflux disease) 07/20/2019   Advanced care planning/counseling discussion 12/17/2016   Skin lesions, generalized 11/13/2016   Chronic right hip pain 06/17/2016   Vitamin D deficiency 09/26/2015   Diastasis recti 08/08/2015   Elevated serum GGT  level 07/24/2015   Essential hypertension 07/17/2015   Fatty liver 07/17/2015   Elevated alkaline phosphatase level 07/17/2015   Elevated serum glutamic pyruvic transaminase (SGPT) level 07/17/2015   Abnormal finding on EKG 07/11/2015   Morbid obesity (HCC) 07/11/2015   Colon, diverticulosis 07/11/2015   Abdominal wall hernia 07/11/2015   DM (diabetes mellitus), type 2 with neurological complications (HCC)     Hyperlipidemia      ONSET DATE: 08/01/2021   REFERRING DIAG: CVA   THERAPY DIAG:  Muscle weakness (generalized)   Rationale for Evaluation and Treatment Rehabilitation   SUBJECTIVE:   SUBJECTIVE STATEMENT:       Pt reports  doing well today.  Pt accompanied by: self   PERTINENT HISTORY:  Pt.  is a 77 y.o. female who had a unexpectedly hospitalized from 2/12-2/14/2024 for COVID-19, and a UTI.  Pt. 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 Pt. attended inpatient rehab from 08/07/2021-08/22/2021. Pt. received Home health therapy services this past spring.  Pt. has recently had an assessment through driver rehabilitation services at [facility] with recommendations for referrals for  outpatient OT/PT, and ST services. Pt. PMHx includes: HTN, Hyperlipidemia, Macular degeneration, DM, and Obesity.   PRECAUTIONS: None   WEIGHT BEARING RESTRICTIONS No   PAIN: Are you having pain? No pain  FALLS: Has patient fallen in last 6 months? Yes.   LIVING ENVIRONMENT: Lives with: lives with their family  Son  [name] Lives in: House/apartment Main living are on one floor Stairs:  2 steps to enter Has following equipment at home: Single point cane, Wheelchair (manual), shower chair, Grab bars, bed rail, and rubber mat.   PLOF: Independent   PATIENT GOALS: To be able to Drive, and cook again   OBJECTIVE:    HAND DOMINANCE: Right    ADLs:  Transfers/ambulation related to ADLs: Independent Eating: Independent Grooming: [employer] Pt. reports being unable to use her left hand to perform haircare.  UB Dressing: Independent pullover shirt, independent now with fastening a bra LB Dressing: Independent donning pants socks, and slide on shoes Toileting: Independent Bathing: Independent Tub Shower transfers: Walk-in shower Independent now    IADLs: Shopping: Needs to be accompanied to the grocery store. Light housekeeping: Do  Meal Prep:  Is able to perform light meal prep Community mobility: Relies on son, and friend Medication management: Son sets up Mining engineer management:TBD Handwriting: TBD   MOBILITY STATUS: Hx of falls out of bed     ACTIVITY TOLERANCE: Activity tolerance:  10-20 min. Before rest break   FUNCTIONAL OUTCOME  MEASURES: FOTO: 61  TR score: 62  10/09/2022: FOTO: 49  (Recert) 06/02/23: 65   UPPER EXTREMITY ROM      Active ROM Right eval Left eval Left  10/09/2022 Left 10/23/22 Left 12/02/2022 Left 01/13/2023 Left  03/10/2023 Left 04/21/23 Left 05/12/23 Left 06/02/23  Shoulder flexion WFL 54(74) scaption 0(55) 26(80) 32(82) 35(82) 35(85) 40(87) 70(96) 77 (104)  Shoulder abduction WFL 58(75) 32(54) 32(50) 45(60) 47(70) 62(72) 48(76) 73(102) 70 (100)  Shoulder adduction              Shoulder extension              Shoulder internal rotation            Thumb to L3 (T10)  Shoulder external rotation            (With elbow at side) 45 (60)  Elbow flexion [facility] WFL 145 145 145 145  145 147  WFL  Elbow extension [facility] WFL 10(0) 10(0) WFL WFL [facility]  WFL  Wrist flexion WFL TBD [facility]  WFL  Wrist extension WFL TBD [facility] WFL [facility] WFL [facility]  WFL  Wrist ulnar deviation              Wrist radial deviation              Wrist pronation              Wrist supination              (Blank rows = not tested)     UPPER EXTREMITY MMT:      MMT Right eval Left eval Left 10/09/2022 Left 10/23/2022 Left 12/02/2022 Left 01/13/2023 Left 03/10/2023 Left 04/21/23 Left 06/02/23  Shoulder flexion 4+/5 2+/5 1/5 2-/5 2-/5 2-/5 2-/5 2-/5 2-  Shoulder abduction 4+/5 2+/5 2-/5 2-/5 2-/5 2-/5 2-/5 2-/5 2-  Shoulder adduction             Shoulder extension             Shoulder internal rotation           4-  Shoulder external rotation           3-  Middle trapezius             Lower trapezius             Elbow flexion 4+/5 3+/5 3/5 3/5 3+/5 3+/5 4/5 4/5 4+/5  Elbow extension 4+/5 3+/5 3/5 3/5 3+/5 3+/5 4/5 4/5 4/5  Wrist flexion 4+/5 TBD 3/5 3/5 3+/5 3+/5 4/5 4/5 4+/5  Wrist extension 4+/5 TBD 3/5 3/5 3+/5 3+/5 4/5 4/5 4/5  Wrist ulnar deviation             Wrist radial deviation             Wrist pronation             Wrist supination             (Blank rows = not tested)   HAND FUNCTION: Grip  strength: Right: 33 lbs; Left: 11 lbs, Lateral pinch: Right: 17 lbs, Left: 2 lbs, and 3 point pinch: Right: 17 lbs, Left: 2 lbs  10/09/2022: Grip strength: Right: 33 lbs; Left: 10 lbs, Lateral pinch: Right: 17 lbs, Left: 1 lbs, and 3 point pinch: Right: 17 lbs, Left: 1 lbs   10/23/2022: Grip strength: Right: 33 lbs; Left: 16 lbs, Lateral pinch: Right: 17 lbs, Left: 1 lbs, and 3 point pinch: Right: 17 lbs, Left: 1 lbs  12/02/2022: Grip strength: Right: 33 lbs; Left: 17 lbs, Lateral pinch: Right: 17 lbs, Left: 7 lbs, and 3 point pinch: Right: 17 lbs, Left: 3 lbs   01/13/2023: Grip strength: Right: 33 lbs; Left: 15 lbs, Lateral pinch: Right: 17 lbs, Left: 9 lbs, and 3 point pinch: Right: 17 lbs, Left: 7 lbs  03/10/2023: Grip strength: Right: 33 lbs; Left: 15 lbs, Lateral pinch: Right: 17 lbs, Left: 9 lbs, and 3 point pinch: Right: 17 lbs, Left: 7 lbs   04/21/2023: Grip strength: Right: 34 lbs; Left: 13 lbs, Lateral pinch: NT 3 point pinch: Right: NT  06/02/23: Grip strength: Right: 34 lbs; Left: 19 lbs, Lateral pinch: Right: 16 lbs; Left: 6 lbs; 3 point pinch: Right: 13 lbs, Left: 7 lbs    COORDINATION:   9 Hole Peg test: Right: 27 sec.  sec; Left: 52  sec.  10/09/2022: 9 Hole Peg test: Right: 27 sec.  sec; Left: 1 min. & 35 sec.  01/13/2023: 9 Hole Peg test: Right: 27 sec.  sec; Left: 51. Sec.  03/10/2023: 9 Hole Peg test: Right: 27 sec.  sec; Left: 54 Sec.  04/21/2023: 9 Hole Peg test: Right: 27 sec.  sec; Left: 46 Sec.  06/02/23: 9 hole Peg test: Left: 47 sec  SENSATION: Light touch: WFL Proprioception: WFL   COGNITION: Overall cognitive status: Within functional limits for tasks assessed   VISION: Subjective report: Glasses. Just received a new prescription to increase bifocal strength Baseline vision: Macular Degeneration Visual history: macular degeneration   VISION ASSESSMENT: To be further assessed in functional context  Left sided Awareness     PERCEPTION: Limited left sided  awareness   TODAY'S TREATMENT:   Therapeutic Exercise:   Pt. performed AAROM/AROM to the left shoulder 2/2 weakness, limited ROM, and stiffness. Pt. worked on [employer], and reciprocal motion using the UBE while seated for 8 min. with no resistance. Constant monitoring was provided. Pt. Performed AAROM shoulder flexion repetitions at the  finger ladder for 3 trials of 18 steps.  Therapeutic Activities:  Pt. worked on increasing Left shoulder ROM through functional tasks  reaching to place various sized containers onto shelves at forehead height.   Pt. worked on reaching for trays, and plates on [employer] using bilateral hands.    PATIENT EDUCATION: Education details: progress towards goals Person educated: Patient Education method: verbal cues Education comprehension: verbalized understanding    HOME EXERCISE PROGRAM:  Reviewed home activities to enhance left hand digit extension during typing skills.   GOALS: Goals reviewed with patient? Yes     SHORT TERM GOALS: Target date: 07/21/23   Pt. Will improve FOTO score by 2 points to reflect improved pt. perceived functional performance Baseline: 06/02/23: 65; 04/21/2023: 49 03/10/2023: 59 01/13/2023: 49 12/01/2022: FOTO: 51 10/23/2022: FOTO 49 10/09/2022: FOTO 49, FOTO: 61 TR score: 62 Goal status: achieved/ongoing   LONG TERM GOALS: Target date: 08/25/2023   Pt. Will improve left shoulder ROM by 10 degrees to be able able to efficiently apply deodorant Baseline: 06/02/23: shoulder flexion 77 (104), abd 70 (100); 04/21/2023: shoulder flexion: 40(87), Abduction: 48(76) 03/10/2023: Shoulder flexion: 35(85) 01/13/2023:  Shoulder flexion: 35(82), Abduction: 47(70) 12/02/2022: Shoulder flexion: 32(82), Abduction: 45(60)  10/23/2022: Shoulder flexion: 26(80), Abduction: 32(50) 10/09/2022: flexion: 0(55), Abduction: 32(54) Goal status: Improving, ongoing   2.  Pt. will be able to independently hold and use a blow dryer, and brush for  hair care. Baseline: 06/02/23: Pt can hold a blow dryer but reaches only to L side of head and lower part of back of head with brush and blow dryer d/t limited ER ROM and strength; 04/21/2023: Pt. is able to hold the hair dryer in the left hand, however is unable to reach up to dry it, and has difficulty using a brush with the left UE. 03/10/2023: Pt. continues to be able to hold the hair dryer in the left hand, however is unable to reach up to dry it, and has difficulty using a brush 01/13/2023: Pt. Can hold the hair dryer in the left hand, however is unable to reach up to dry it, and has difficulty using a brush. 12/02/2022: Pt. is unable to actively elevate he left shoulder in order to blow dry her hair. 10/23/2022: Pt. continues to be unable to hold a blow dryer. Eval: Pt. Is unable to sustain her BUEs in elevation, and  use a blow dryer, and brush. 10/09/2022: Pt. Is unable to hold a blow dryer. Eval: Pt. Is unable to sustain her BUEs in elevation, and use a blow dryer, and brush. Goal status: improving/ongoing   3.  Pt. Will improve left lateral pinch strength by 3# to be able to independently cut meat. Baseline: 06/02/23: L lateral pinch: 6# (pt reports she lacks the strength to cut steak but can cut tender chicken); 04/21/2023: NT(Pinch Meter sent out for calibration) Pt. continues to have difficulty cutting meat. 03/10/2023: Left lateral pinch strength: 9#   01/13/2023: Left lateral pinch strength: 9#  12/02/2022: Left pinch: 7# Pt. has difficulty cutting meat. 10/23/2022: Pt. Continues to present with difficulty cutting meat. 10/09/2022: 1#  Eval: 2#. Pt. has difficulty stabilizing utensil, and food while cutting food. Goal status: Ongoing   4.  Pt. Will improve Left hand [facility] skills to be able to be able to independently, and efficiently manipulate small objects during ADL tasks. Baseline: 06/02/23: L 47 sec; 04/21/2023:  46 sec. 03/10/2023: 54 sec. 01/13/2023: 51 sec. 12/02/2022: TBD  10/23/2022: Pt. Continues  to present with difficulty manipulating small objects. 10/09/2022: Left: 1 min & 35 sec.: Eval: Left: 52 sec. Right: 27 sec. Goal status: Improving, Ongoing    6.: Pt. will improve with left grip strength to be able to hold and pull up pants Baseline: 06/02/23: L grip 19#, improving; 04/21/2023: Pt. is able to pull up covers, however has difficulty pulling up pants. 03/10/2023: left  grip strength: 15# 01/13/2023: left  grip strength: 15# 12/02/2022: 17# 10/23/2022: Left: 16# 10/09/2022: Left: 10# Eval:  Left grip strength 11#. Pt. has difficulty holding a full drink  securely with the left hand.             Goal status: Improved, Revised to pants.   7. Pt. will improve typing speed, and accuracy in preparation for efficiently typing simple email message. Baseline: 06/02/23: same as 04/21/23; 04/21/2023:  Pt. continues to present with limited speed, and accuracy with typing  03/10/2023: Pt. continues to present with limited speed, and accuracy with typing 01/13/2023: TBD 12/02/2022:  Pt. continues to present with limited speed, and accuracy with typing.10/23/2022: Pt. continues to present with limited speed, and accuracy with typing. 10/09/2022: Pt. presents with difficulty typing. Pt. presents with difficulty typing an email. Typing speed, and accuracy TBD             Goal status: Improved                                CLINICAL IMPRESSION:  Pt. continues to report no pain in the left shoulder. Pt. was able to progress to achieve shoulder flexion for 3 sets of 18 steps on the finger ladder with the left UE. Pt. was consistently able to place, and retrieve items from shelves at forehead height without pain. Pt. does require cues to avoid compensating proximally during shoulder flexion, and to avoid going up onto her toes to increase reach. Pt. continues to benefit from OT services to work towards improving Left UE functioning in order to maximize engagement in, and overall independence with ADLs, and IADL tasks,  while reducing risk of adhesive capsulitis.     PERFORMANCE DEFICITS in functional skills including ADLs, IADLs, coordination, dexterity, sensation, ROM, strength, FMC, decreased knowledge of use of DME, vision, and UE functional use, cognitive skills including attention, and psychosocial skills including coping strategies, environmental adaptation,  habits, and routines and behaviors.    IMPAIRMENTS are limiting patient from ADLs, IADLs, and social participation.    COMORBIDITIES may have co-morbidities  that affects occupational performance. Patient will benefit from skilled OT to address above impairments and improve overall function.   MODIFICATION OR ASSISTANCE TO COMPLETE EVALUATION: Min-Moderate modification of tasks or assist with assess necessary to complete an evaluation.   OT OCCUPATIONAL PROFILE AND HISTORY: Detailed assessment: Review of records and additional review of physical, cognitive, psychosocial history related to current functional performance.   CLINICAL DECISION MAKING: Moderate - several treatment options, min-mod task modification necessary   REHAB POTENTIAL: Good   EVALUATION COMPLEXITY: Moderate      PLAN: OT FREQUENCY: 2x/week   OT DURATION: 12 weeks   PLANNED INTERVENTIONS: self care/ADL training, therapeutic exercise, therapeutic activity, neuromuscular re-education, manual therapy, passive range of motion, and paraffin   RECOMMENDED OTHER SERVICES: ST, and PT   CONSULTED AND AGREED WITH PLAN OF CARE: Patient   PLAN FOR NEXT SESSION:  Left hand strengthening and coordination exercises   Olegario Messier, MS, OTR/L   06/11/2023

## 2023-06-16 ENCOUNTER — Ambulatory Visit: Payer: Medicare HMO | Admitting: Occupational Therapy

## 2023-06-16 ENCOUNTER — Ambulatory Visit: Payer: Medicare HMO | Admitting: Physical Therapy

## 2023-06-16 DIAGNOSIS — M6281 Muscle weakness (generalized): Secondary | ICD-10-CM

## 2023-06-16 NOTE — Therapy (Addendum)
Occupational Therapy Progress Note  Dates of reporting period  04/21/2023   to   06/16/2023    Patient Name: Alyssa Mayo MRN: [id number] DOB:06-21-1946, 77 y.o., female Today's Date: 03/14/2022   PCP: [name], MD REFERRING PROVIDER: [name], NP     OT End of Session - 06/16/23 1021     Visit Number 40    Number of Visits 81    Date for OT Re-Evaluation 08/25/23    Authorization Type Progress reporting period starting 04/21/23    OT Start Time 1015    OT Stop Time 1100    OT Time Calculation (min) 45 min    Activity Tolerance Patient tolerated treatment well    Behavior During Therapy [facility] for tasks assessed/performed                     Past Medical History:  Diagnosis Date   Abnormal levels of other serum enzymes 07/17/2015   Arthritis     Constipation 07/11/2015   COVID-19 03/06/2021   Diabetes mellitus without complication (HCC)     Elevated liver enzymes     Fatty liver     Generalized abdominal pain 07/11/2015   Hyperlipidemia     Hypertension     Lifelong obesity     Macular degeneration     Morbid obesity due to excess calories (HCC) 07/11/2015   Other fatigue 07/11/2015   Sleep apnea     Spinal headache           Past Surgical History:  Procedure Laterality Date   ABDOMINAL HYSTERECTOMY       APPENDECTOMY       CATARACT EXTRACTION       CERVICAL LAMINECTOMY   07/21/1985   CESAREAN SECTION       COLONOSCOPY   07/21/2012    diverticulosis, [facility] Dr. [name]    COLONOSCOPY WITH PROPOFOL N/A 02/04/2019    Procedure: COLONOSCOPY WITH PROPOFOL;  Surgeon: [name], MD;  Location: [facility] ENDOSCOPY;  Service: Endoscopy;  Laterality: N/A;   COLONOSCOPY WITH PROPOFOL N/A 03/18/2021    Procedure: COLONOSCOPY WITH PROPOFOL;  Surgeon: [name], MD;  Location: [facility] ENDOSCOPY;  Service: Endoscopy;  Laterality: N/A;  DM   DIAGNOSTIC LAPAROSCOPY       ESOPHAGOGASTRODUODENOSCOPY (EGD) WITH PROPOFOL N/A 02/04/2019    Procedure:  ESOPHAGOGASTRODUODENOSCOPY (EGD) WITH PROPOFOL;  Surgeon: [name], MD;  Location: [facility] ENDOSCOPY;  Service: Endoscopy;  Laterality: N/A;   ESOPHAGOGASTRODUODENOSCOPY (EGD) WITH PROPOFOL N/A 03/18/2021    Procedure: ESOPHAGOGASTRODUODENOSCOPY (EGD) WITH PROPOFOL;  Surgeon: [name], MD;  Location: [facility] ENDOSCOPY;  Service: Endoscopy;  Laterality: N/A;   LOOP RECORDER INSERTION N/A 08/05/2021    Procedure: LOOP RECORDER INSERTION;  Surgeon: [name], MD;  Location: [facility] INVASIVE CV LAB;  Service: Cardiovascular;  Laterality: N/A;   SPINE SURGERY       TUBAL LIGATION            Patient Active Problem List    Diagnosis Date Noted   Paroxysmal atrial fibrillation (HCC) 08/08/2022   Hypercoagulable state due to paroxysmal atrial fibrillation (HCC) 08/08/2022   Cerebral edema (HCC) 08/07/2021   OSA (obstructive sleep apnea) 08/07/2021   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   CVA (cerebral vascular accident) (HCC) 08/01/2021   GERD (gastroesophageal reflux disease) 07/20/2019   Advanced care planning/counseling discussion 12/17/2016   Skin lesions, generalized 11/13/2016   Chronic right hip pain 06/17/2016   Vitamin  D deficiency 09/26/2015   Diastasis recti 08/08/2015   Elevated serum GGT level 07/24/2015   Essential hypertension 07/17/2015   Fatty liver 07/17/2015   Elevated alkaline phosphatase level 07/17/2015   Elevated serum glutamic pyruvic transaminase (SGPT) level 07/17/2015   Abnormal finding on EKG 07/11/2015   Morbid obesity (HCC) 07/11/2015   Colon, diverticulosis 07/11/2015   Abdominal wall hernia 07/11/2015   DM (diabetes mellitus), type 2 with neurological complications (HCC)     Hyperlipidemia      ONSET DATE: 08/01/2021   REFERRING DIAG: CVA   THERAPY DIAG:  Muscle weakness (generalized)   Rationale for Evaluation and Treatment Rehabilitation   SUBJECTIVE:   SUBJECTIVE STATEMENT:       Pt. reports doing well today, and is  excited about her family coming to visit for the Holidays.  Pt accompanied by: self   PERTINENT HISTORY:  Pt. is a 77 y.o. female who had a unexpectedly hospitalized from 2/12-2/14/2024 for COVID-19, and a UTI.  Pt. 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 Pt. attended inpatient rehab from 08/07/2021-08/22/2021. Pt. received Home health therapy services this past spring.  Pt. has recently had an assessment through driver rehabilitation services at [facility] with recommendations for referrals for  outpatient OT/PT, and ST services. Pt. PMHx includes: HTN, Hyperlipidemia, Macular degeneration, DM, and Obesity.   PRECAUTIONS: None   WEIGHT BEARING RESTRICTIONS No   PAIN: Are you having pain? No pain  FALLS: Has patient fallen in last 6 months? Yes.   LIVING ENVIRONMENT: Lives with: lives with their family  Son  [name] Lives in: House/apartment Main living are on one floor Stairs:  2 steps to enter Has following equipment at home: Single point cane, Wheelchair (manual), shower chair, Grab bars, bed rail, and rubber mat.   PLOF: Independent   PATIENT GOALS: To be able to Drive, and cook again   OBJECTIVE:    HAND DOMINANCE: Right    ADLs:  Transfers/ambulation related to ADLs: Independent Eating: Independent Grooming: [employer] Pt. reports being unable to use her left hand to perform haircare.  UB Dressing: Independent pullover shirt, independent now with fastening a bra LB Dressing: Independent donning pants socks, and slide on shoes Toileting: Independent Bathing: Independent Tub Shower transfers: Walk-in shower Independent now    IADLs: Shopping: Needs to be accompanied to the grocery store. Light housekeeping: Do  Meal Prep:  Is able to perform light meal prep Community mobility: Relies on son, and friend Medication management: Son sets up Mining engineer management:TBD Handwriting: TBD   MOBILITY STATUS: Hx of falls  out of bed     ACTIVITY TOLERANCE: Activity tolerance:  10-20 min. Before rest break   FUNCTIONAL OUTCOME MEASURES: FOTO: 61  TR score: 62  10/09/2022: FOTO: 49  (Recert) 06/02/23: 65   UPPER EXTREMITY ROM      Active ROM Right eval Left eval Left  10/09/2022 Left 10/23/22 Left 12/02/2022 Left 01/13/2023 Left  03/10/2023 Left 04/21/23 Left 05/12/23 Left 06/02/23 Left 06/16/23  Shoulder flexion WFL 54(74) scaption 0(55) 26(80) 32(82) 35(82) 35(85) 40(87) 70(96) 77 (104) 80(108)  Shoulder abduction WFL 58(75) 32(54) 32(50) 45(60) 47(70) 62(72) 48(76) 73(102) 70 (100) 76(106)  Shoulder adduction               Shoulder extension               Shoulder internal rotation            Thumb to L3 (T10)  Thumb to L3 (T10)  Shoulder external rotation            (With elbow at side) 45 (60) (With elbow at side) 47 (60)  Elbow flexion [facility] WFL 145 145 145 145 145 147  WFL WFL  Elbow extension [facility] WFL 10(0) 10(0) WFL WFL [facility]  [facility] WFL  Wrist flexion WFL TBD [facility]  WFL WFL  Wrist extension WFL TBD [facility] WFL [facility] WFL [facility]  WFL WFL  Wrist ulnar deviation               Wrist radial deviation               Wrist pronation             WFL  Wrist supination             WFL  (Blank rows = not tested)     UPPER EXTREMITY MMT:      MMT Right eval Left eval Left 10/09/2022 Left 10/23/2022 Left 12/02/2022 Left 01/13/2023 Left 03/10/2023 Left 04/21/23 Left 06/02/23 Left 06/16/23  Shoulder flexion 4+/5 2+/5 1/5 2-/5 2-/5 2-/5 2-/5 2-/5 2- 2+/5  Shoulder abduction 4+/5 2+/5 2-/5 2-/5 2-/5 2-/5 2-/5 2-/5 2- 2+/5  Shoulder adduction              Shoulder extension              Shoulder internal rotation           4- 4-/5  Shoulder external rotation           3- 3-/5  Middle trapezius              Lower trapezius              Elbow flexion 4+/5 3+/5 3/5 3/5 3+/5 3+/5 4/5 4/5 4+/5 4+/5  Elbow extension 4+/5 3+/5 3/5 3/5 3+/5 3+/5 4/5 4/5 4/5 4+/5  Wrist flexion 4+/5 TBD 3/5  3/5 3+/5 3+/5 4/5 4/5 4+/5 4+/5  Wrist extension 4+/5 TBD 3/5 3/5 3+/5 3+/5 4/5 4/5 4/5 4/5  Wrist ulnar deviation              Wrist radial deviation              Wrist pronation              Wrist supination              (Blank rows = not tested)   HAND FUNCTION: Grip strength: Right: 33 lbs; Left: 11 lbs, Lateral pinch: Right: 17 lbs, Left: 2 lbs, and 3 point pinch: Right: 17 lbs, Left: 2 lbs  10/09/2022: Grip strength: Right: 33 lbs; Left: 10 lbs, Lateral pinch: Right: 17 lbs, Left: 1 lbs, and 3 point pinch: Right: 17 lbs, Left: 1 lbs   10/23/2022: Grip strength: Right: 33 lbs; Left: 16 lbs, Lateral pinch: Right: 17 lbs, Left: 1 lbs, and 3 point pinch: Right: 17 lbs, Left: 1 lbs  12/02/2022: Grip strength: Right: 33 lbs; Left: 17 lbs, Lateral pinch: Right: 17 lbs, Left: 7 lbs, and 3 point pinch: Right: 17 lbs, Left: 3 lbs   01/13/2023: Grip strength: Right: 33 lbs; Left: 15 lbs, Lateral pinch: Right: 17 lbs, Left: 9 lbs, and 3 point pinch: Right: 17 lbs, Left: 7 lbs  03/10/2023: Grip strength: Right: 33 lbs; Left: 15 lbs, Lateral pinch: Right: 17 lbs, Left: 9 lbs, and 3 point pinch: Right: 17 lbs,  Left: 7 lbs   04/21/2023: Grip strength: Right: 34 lbs; Left: 13 lbs, Lateral pinch: NT 3 point pinch: Right: NT  06/02/23: Grip strength: Right: 34 lbs; Left: 19 lbs, Lateral pinch: Right: 16 lbs; Left: 6 lbs; 3 point pinch: Right: 13 lbs, Left: 7 lbs   06/16/2023: Grip strength: Right: 39 lbs; Left: 19 lbs, Lateral pinch: Right: 16 lbs; Left: 5 lbs; 3 point pinch: Right: 13 lbs, Left: 9 lbs      COORDINATION:   9 Hole Peg test: Right: 27 sec.  sec; Left: 52 sec.  10/09/2022: 9 Hole Peg test: Right: 27 sec.  sec; Left: 1 min. & 35 sec.  01/13/2023: 9 Hole Peg test: Right: 27 sec.  sec; Left: 51. Sec.  03/10/2023: 9 Hole Peg test: Right: 27 sec.  sec; Left: 54 Sec.  04/21/2023: 9 Hole Peg test: Right: 27 sec.  sec; Left: 46 Sec.  06/02/23: 9 hole Peg test: Left: 47 sec  06/15/23: 9  hole Peg test: Left: 45 sec  SENSATION: Light touch: WFL Proprioception: WFL   COGNITION: Overall cognitive status: Within functional limits for tasks assessed   VISION: Subjective report: Glasses. Just received a new prescription to increase bifocal strength Baseline vision: Macular Degeneration Visual history: macular degeneration   VISION ASSESSMENT: To be further assessed in functional context  Left sided Awareness     PERCEPTION: Limited left sided awareness   TODAY'S TREATMENT:   Measurements were obtained, and goals were reviewed with the Pt.   PATIENT EDUCATION: Education details: progress towards goals Person educated: Patient Education method: verbal cues Education comprehension: verbalized understanding    HOME EXERCISE PROGRAM:  Reviewed home activities to enhance left hand digit extension during typing skills.   GOALS: Goals reviewed with patient? Yes     SHORT TERM GOALS: Target date: 07/21/23   Pt. Will improve FOTO score by 2 points to reflect improved pt. perceived functional performance Baseline: 06/16/2023: FOTO score: 70 06/02/23: 65; 04/21/2023: 49 03/10/2023: 59 01/13/2023: 49 12/01/2022: FOTO: 51 10/23/2022: FOTO 49 10/09/2022: FOTO 49, FOTO: 61 TR score: 62 Goal status: achieved/ongoing   LONG TERM GOALS: Target date: 08/25/2023   Pt. Will improve left shoulder ROM by 10 degrees to be able able to efficiently apply deodorant Baseline: 06/16/23: shoulder flexion 80 (108), abd 76(106) Pt. Is able to apply deodorant to the left, however takes increased time.; 06/02/23: shoulder flexion 77 (104), abd 70 (100); 04/21/2023: shoulder flexion: 40(87), Abduction: 48(76) 03/10/2023: Shoulder flexion: 35(85) 01/13/2023:  Shoulder flexion: 35(82), Abduction: 47(70) 12/02/2022: Shoulder flexion: 32(82), Abduction: 45(60)  10/23/2022: Shoulder flexion: 26(80), Abduction: 32(50) 10/09/2022: flexion: 0(55), Abduction: 32(54) Goal status: Improving, ongoing   2.  Pt. will  be able to independently hold and use a blow dryer, and brush for hair care. Baseline: 06/16/2023: Pt can hold a blow dryer but reaches only to L side of head and lower part of back of head with brush and blow dryer d/t limited ER ROM and strength; 06/02/23: Pt can hold a blow dryer but reaches only to L side of head and lower part of back of head with brush and blow dryer d/t limited ER ROM and strength; 04/21/2023: Pt. is able to hold the hair dryer in the left hand, however is unable to reach up to dry it, and has difficulty using a brush with the left UE. 03/10/2023: Pt. continues to be able to hold the hair dryer in the left hand, however is unable to reach up to dry  it, and has difficulty using a brush 01/13/2023: Pt. Can hold the hair dryer in the left hand, however is unable to reach up to dry it, and has difficulty using a brush. 12/02/2022: Pt. is unable to actively elevate he left shoulder in order to blow dry her hair. 10/23/2022: Pt. continues to be unable to hold a blow dryer. Eval: Pt. Is unable to sustain her BUEs in elevation, and use a blow dryer, and brush. 10/09/2022: Pt. Is unable to hold a blow dryer. Eval: Pt. Is unable to sustain her BUEs in elevation, and use a blow dryer, and brush. Goal status: improving/ongoing   3.  Pt. Will improve left lateral pinch strength by 3# to be able to independently cut meat. Baseline:06/16/2023: Lateral pinch: Left: 5 lbs. Pt. Has difficulty cutting steak, however is able to cut tender pieces of chicken. 06/02/23: L lateral pinch: 6# (pt reports she lacks the strength to cut steak but can cut tender chicken); 04/21/2023: NT(Pinch Meter sent out for calibration) Pt. continues to have difficulty cutting meat. 03/10/2023: Left lateral pinch strength: 9#   01/13/2023: Left lateral pinch strength: 9#  12/02/2022: Left pinch: 7# Pt. has difficulty cutting meat. 10/23/2022: Pt. Continues to present with difficulty cutting meat. 10/09/2022: 1#  Eval: 2#. Pt. has  difficulty stabilizing utensil, and food while cutting food. Goal status: Ongoing   4.  Pt. Will improve Left hand [facility] skills to be able to be able to independently, and efficiently manipulate small objects during ADL tasks. Baseline: 06/16/2023: Left 45 sec. 06/02/2023: L 47 sec; 04/21/2023:  46 sec. 03/10/2023: 54 sec. 01/13/2023: 51 sec. 12/02/2022: TBD  10/23/2022: Pt. Continues to present with difficulty manipulating small objects. 10/09/2022: Left: 1 min & 35 sec.: Eval: Left: 52 sec. Right: 27 sec. Goal status: Improving, Ongoing    6.: Pt. will improve with left grip strength to be able to hold and pull up pants Baseline:  06/16/2023: Left grip strength: 19# Pt. Is now able to pull her pants up.06/02/23: L grip 19#, improving; 04/21/2023: Pt. is able to pull up covers, however has difficulty pulling up pants. 03/10/2023: left  grip strength: 15# 01/13/2023: left  grip strength: 15# 12/02/2022: 17# 10/23/2022: Left: 16# 10/09/2022: Left: 10# Eval:  Left grip strength 11#. Pt. has difficulty holding a full drink  securely with the left hand.             Goal status: Improved   7. Pt. will improve typing speed, and accuracy in preparation for efficiently typing simple email message. Baseline: 06/16/2023:  Limited ability to carry-over practice at home due to Pt.'s mouse at home is not working. Pt. Also can not see the keys on her computer at home.  06/02/23: same as 04/21/23; 04/21/2023:  Pt. continues to present with limited speed, and accuracy with typing  03/10/2023: Pt. continues to present with limited speed, and accuracy with typing 01/13/2023: TBD 12/02/2022:  Pt. continues to present with limited speed, and accuracy with typing.10/23/2022: Pt. continues to present with limited speed, and accuracy with typing. 10/09/2022: Pt. presents with difficulty typing. Pt. presents with difficulty typing an email. Typing speed, and accuracy TBD             Goal status: Deferred                                 CLINICAL IMPRESSION:  Measurements were obtained, and goals were reviewed with the  Pt. Pt. has made progress over this past reporting period with left shoulder active, and passive ROM, as well as left hand [facility] skills. Pt. pain is improved overall. Pt. is now able to reach up more into cabinets with both hands for dishes. Pt. is able to use her right hand to securely hold, and pull up her pants. Pt. is able to reach to perform hair care on the left side, and backside over her hair. Pt. requires increased time to complete these tasks. Pt. continues to present with limited left shoulder ROM, limited LUE functional reach, limited grip strength, pinch strength, and FMC skills. Pt. continues to benefit from OT services to work towards improving Left UE functioning in order to maximize engagement in, and overall independence with ADLs, and IADL tasks, while reducing risk of adhesive capsulitis.     PERFORMANCE DEFICITS in functional skills including ADLs, IADLs, coordination, dexterity, sensation, ROM, strength, FMC, decreased knowledge of use of DME, vision, and UE functional use, cognitive skills including attention, and psychosocial skills including coping strategies, environmental adaptation, habits, and routines and behaviors.    IMPAIRMENTS are limiting patient from ADLs, IADLs, and social participation.    COMORBIDITIES may have co-morbidities  that affects occupational performance. Patient will benefit from skilled OT to address above impairments and improve overall function.   MODIFICATION OR ASSISTANCE TO COMPLETE EVALUATION: Min-Moderate modification of tasks or assist with assess necessary to complete an evaluation.   OT OCCUPATIONAL PROFILE AND HISTORY: Detailed assessment: Review of records and additional review of physical, cognitive, psychosocial history related to current functional performance.   CLINICAL DECISION MAKING: Moderate - several treatment options, min-mod task modification  necessary   REHAB POTENTIAL: Good   EVALUATION COMPLEXITY: Moderate      PLAN: OT FREQUENCY: 2x/week   OT DURATION: 12 weeks   PLANNED INTERVENTIONS: self care/ADL training, therapeutic exercise, therapeutic activity, neuromuscular re-education, manual therapy, passive range of motion, and paraffin   RECOMMENDED OTHER SERVICES: ST, and PT   CONSULTED AND AGREED WITH PLAN OF CARE: Patient   PLAN FOR NEXT SESSION:  Left hand strengthening and coordination exercises   Olegario Messier, MS, OTR/L   06/16/2023

## 2023-06-21 LAB — CUP PACEART REMOTE DEVICE CHECK
Date Time Interrogation Session: 20241129230254
Implantable Pulse Generator Implant Date: 20230116

## 2023-06-22 ENCOUNTER — Ambulatory Visit: Payer: Medicare HMO

## 2023-06-22 DIAGNOSIS — I639 Cerebral infarction, unspecified: Secondary | ICD-10-CM | POA: Diagnosis not present

## 2023-06-23 ENCOUNTER — Ambulatory Visit: Payer: Medicare HMO | Attending: Adult Health | Admitting: Occupational Therapy

## 2023-06-23 ENCOUNTER — Ambulatory Visit: Payer: Medicare HMO | Admitting: Physical Therapy

## 2023-06-23 ENCOUNTER — Encounter: Payer: Self-pay | Admitting: Occupational Therapy

## 2023-06-23 DIAGNOSIS — M6281 Muscle weakness (generalized): Secondary | ICD-10-CM | POA: Insufficient documentation

## 2023-06-23 DIAGNOSIS — R278 Other lack of coordination: Secondary | ICD-10-CM | POA: Insufficient documentation

## 2023-06-23 NOTE — Therapy (Signed)
Occupational Therapy Progress Note  Dates of reporting period  04/21/2023   to   06/16/2023    Patient Name: Alyssa Mayo MRN: [id number] DOB:03-30-1946, 77 y.o., female Today's Date: 03/14/2022   PCP: [name], MD REFERRING PROVIDER: [name], NP     OT End of Session - 06/23/23 1018     Visit Number 41    Number of Visits 81    Date for OT Re-Evaluation 08/25/23    Authorization Type Progress reporting period starting 04/21/23    OT Start Time 1015    OT Stop Time 1100    OT Time Calculation (min) 45 min    Activity Tolerance Patient tolerated treatment well    Behavior During Therapy [facility] for tasks assessed/performed                     Past Medical History:  Diagnosis Date   Abnormal levels of other serum enzymes 07/17/2015   Arthritis     Constipation 07/11/2015   COVID-19 03/06/2021   Diabetes mellitus without complication (HCC)     Elevated liver enzymes     Fatty liver     Generalized abdominal pain 07/11/2015   Hyperlipidemia     Hypertension     Lifelong obesity     Macular degeneration     Morbid obesity due to excess calories (HCC) 07/11/2015   Other fatigue 07/11/2015   Sleep apnea     Spinal headache           Past Surgical History:  Procedure Laterality Date   ABDOMINAL HYSTERECTOMY       APPENDECTOMY       CATARACT EXTRACTION       CERVICAL LAMINECTOMY   07/21/1985   CESAREAN SECTION       COLONOSCOPY   07/21/2012    diverticulosis, [facility] Dr. [name]    COLONOSCOPY WITH PROPOFOL N/A 02/04/2019    Procedure: COLONOSCOPY WITH PROPOFOL;  Surgeon: [name], MD;  Location: [facility] ENDOSCOPY;  Service: Endoscopy;  Laterality: N/A;   COLONOSCOPY WITH PROPOFOL N/A 03/18/2021    Procedure: COLONOSCOPY WITH PROPOFOL;  Surgeon: [name], MD;  Location: [facility] ENDOSCOPY;  Service: Endoscopy;  Laterality: N/A;  DM   DIAGNOSTIC LAPAROSCOPY       ESOPHAGOGASTRODUODENOSCOPY (EGD) WITH PROPOFOL N/A 02/04/2019    Procedure:  ESOPHAGOGASTRODUODENOSCOPY (EGD) WITH PROPOFOL;  Surgeon: [name], MD;  Location: [facility] ENDOSCOPY;  Service: Endoscopy;  Laterality: N/A;   ESOPHAGOGASTRODUODENOSCOPY (EGD) WITH PROPOFOL N/A 03/18/2021    Procedure: ESOPHAGOGASTRODUODENOSCOPY (EGD) WITH PROPOFOL;  Surgeon: [name], MD;  Location: [facility] ENDOSCOPY;  Service: Endoscopy;  Laterality: N/A;   LOOP RECORDER INSERTION N/A 08/05/2021    Procedure: LOOP RECORDER INSERTION;  Surgeon: [name], MD;  Location: [facility] INVASIVE CV LAB;  Service: Cardiovascular;  Laterality: N/A;   SPINE SURGERY       TUBAL LIGATION            Patient Active Problem List    Diagnosis Date Noted   Paroxysmal atrial fibrillation (HCC) 08/08/2022   Hypercoagulable state due to paroxysmal atrial fibrillation (HCC) 08/08/2022   Cerebral edema (HCC) 08/07/2021   OSA (obstructive sleep apnea) 08/07/2021   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   CVA (cerebral vascular accident) (HCC) 08/01/2021   GERD (gastroesophageal reflux disease) 07/20/2019   Advanced care planning/counseling discussion 12/17/2016   Skin lesions, generalized 11/13/2016   Chronic right hip pain 06/17/2016   Vitamin  D deficiency 09/26/2015   Diastasis recti 08/08/2015   Elevated serum GGT level 07/24/2015   Essential hypertension 07/17/2015   Fatty liver 07/17/2015   Elevated alkaline phosphatase level 07/17/2015   Elevated serum glutamic pyruvic transaminase (SGPT) level 07/17/2015   Abnormal finding on EKG 07/11/2015   Morbid obesity (HCC) 07/11/2015   Colon, diverticulosis 07/11/2015   Abdominal wall hernia 07/11/2015   DM (diabetes mellitus), type 2 with neurological complications (HCC)     Hyperlipidemia      ONSET DATE: 08/01/2021   REFERRING DIAG: CVA   THERAPY DIAG:  Muscle weakness (generalized)   Rationale for Evaluation and Treatment Rehabilitation   SUBJECTIVE:   SUBJECTIVE STATEMENT:       Pt. reports she enjoyed time with  her family over the holiday.   Pt accompanied by: self   PERTINENT HISTORY:  Pt. is a 77 y.o. female who had a unexpectedly hospitalized from 2/12-2/14/2024 for COVID-19, and a UTI.  Pt. 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 Pt. attended inpatient rehab from 08/07/2021-08/22/2021. Pt. received Home health therapy services this past spring.  Pt. has recently had an assessment through driver rehabilitation services at [facility] with recommendations for referrals for  outpatient OT/PT, and ST services. Pt. PMHx includes: HTN, Hyperlipidemia, Macular degeneration, DM, and Obesity.   PRECAUTIONS: None   WEIGHT BEARING RESTRICTIONS No   PAIN: Are you having pain? No pain  FALLS: Has patient fallen in last 6 months? Yes.   LIVING ENVIRONMENT: Lives with: lives with their family  Son  [name] Lives in: House/apartment Main living are on one floor Stairs:  2 steps to enter Has following equipment at home: Single point cane, Wheelchair (manual), shower chair, Grab bars, bed rail, and rubber mat.   PLOF: Independent   PATIENT GOALS: To be able to Drive, and cook again   OBJECTIVE:    HAND DOMINANCE: Right    ADLs:  Transfers/ambulation related to ADLs: Independent Eating: Independent Grooming: [employer] Pt. reports being unable to use her left hand to perform haircare.  UB Dressing: Independent pullover shirt, independent now with fastening a bra LB Dressing: Independent donning pants socks, and slide on shoes Toileting: Independent Bathing: Independent Tub Shower transfers: Walk-in shower Independent now    IADLs: Shopping: Needs to be accompanied to the grocery store. Light housekeeping: Do  Meal Prep:  Is able to perform light meal prep Community mobility: Relies on son, and friend Medication management: Son sets up Mining engineer management:TBD Handwriting: TBD   MOBILITY STATUS: Hx of falls out of bed     ACTIVITY  TOLERANCE: Activity tolerance:  10-20 min. Before rest break   FUNCTIONAL OUTCOME MEASURES: FOTO: 61  TR score: 62  10/09/2022: FOTO: 49  (Recert) 06/02/23: 65   UPPER EXTREMITY ROM      Active ROM Right eval Left eval Left  10/09/2022 Left 10/23/22 Left 12/02/2022 Left 01/13/2023 Left  03/10/2023 Left 04/21/23 Left 05/12/23 Left 06/02/23 Left 06/16/23  Shoulder flexion WFL 54(74) scaption 0(55) 26(80) 32(82) 35(82) 35(85) 40(87) 70(96) 77 (104) 80(108)  Shoulder abduction WFL 58(75) 32(54) 32(50) 45(60) 47(70) 62(72) 48(76) 73(102) 70 (100) 76(106)  Shoulder adduction               Shoulder extension               Shoulder internal rotation            Thumb to L3 (T10) Thumb to L3 (T10)  Shoulder external rotation            (With elbow at side) 45 (60) (With elbow at side) 47 (60)  Elbow flexion [facility] WFL 145 145 145 145 145 147  WFL WFL  Elbow extension [facility] WFL 10(0) 10(0) WFL WFL [facility] WFL  Wrist flexion WFL TBD [facility]  WFL WFL  Wrist extension WFL TBD [facility] WFL [facility] WFL [facility]  WFL WFL  Wrist ulnar deviation               Wrist radial deviation               Wrist pronation             WFL  Wrist supination             WFL  (Blank rows = not tested)     UPPER EXTREMITY MMT:      MMT Right eval Left eval Left 10/09/2022 Left 10/23/2022 Left 12/02/2022 Left 01/13/2023 Left 03/10/2023 Left 04/21/23 Left 06/02/23 Left 06/16/23  Shoulder flexion 4+/5 2+/5 1/5 2-/5 2-/5 2-/5 2-/5 2-/5 2- 2+/5  Shoulder abduction 4+/5 2+/5 2-/5 2-/5 2-/5 2-/5 2-/5 2-/5 2- 2+/5  Shoulder adduction              Shoulder extension              Shoulder internal rotation           4- 4-/5  Shoulder external rotation           3- 3-/5  Middle trapezius              Lower trapezius              Elbow flexion 4+/5 3+/5 3/5 3/5 3+/5 3+/5 4/5 4/5 4+/5 4+/5  Elbow extension 4+/5 3+/5 3/5 3/5 3+/5 3+/5 4/5 4/5 4/5 4+/5  Wrist flexion 4+/5 TBD 3/5 3/5 3+/5 3+/5 4/5 4/5 4+/5  4+/5  Wrist extension 4+/5 TBD 3/5 3/5 3+/5 3+/5 4/5 4/5 4/5 4/5  Wrist ulnar deviation              Wrist radial deviation              Wrist pronation              Wrist supination              (Blank rows = not tested)   HAND FUNCTION: Grip strength: Right: 33 lbs; Left: 11 lbs, Lateral pinch: Right: 17 lbs, Left: 2 lbs, and 3 point pinch: Right: 17 lbs, Left: 2 lbs  10/09/2022: Grip strength: Right: 33 lbs; Left: 10 lbs, Lateral pinch: Right: 17 lbs, Left: 1 lbs, and 3 point pinch: Right: 17 lbs, Left: 1 lbs   10/23/2022: Grip strength: Right: 33 lbs; Left: 16 lbs, Lateral pinch: Right: 17 lbs, Left: 1 lbs, and 3 point pinch: Right: 17 lbs, Left: 1 lbs  12/02/2022: Grip strength: Right: 33 lbs; Left: 17 lbs, Lateral pinch: Right: 17 lbs, Left: 7 lbs, and 3 point pinch: Right: 17 lbs, Left: 3 lbs   01/13/2023: Grip strength: Right: 33 lbs; Left: 15 lbs, Lateral pinch: Right: 17 lbs, Left: 9 lbs, and 3 point pinch: Right: 17 lbs, Left: 7 lbs  03/10/2023: Grip strength: Right: 33 lbs; Left: 15 lbs, Lateral pinch: Right: 17 lbs, Left: 9 lbs, and 3 point pinch: Right: 17 lbs, Left: 7 lbs  04/21/2023: Grip strength: Right: 34 lbs; Left: 13 lbs, Lateral pinch: NT 3 point pinch: Right: NT  06/02/23: Grip strength: Right: 34 lbs; Left: 19 lbs, Lateral pinch: Right: 16 lbs; Left: 6 lbs; 3 point pinch: Right: 13 lbs, Left: 7 lbs   06/16/2023: Grip strength: Right: 39 lbs; Left: 19 lbs, Lateral pinch: Right: 16 lbs; Left: 5 lbs; 3 point pinch: Right: 13 lbs, Left: 9 lbs      COORDINATION:   9 Hole Peg test: Right: 27 sec.  sec; Left: 52 sec.  10/09/2022: 9 Hole Peg test: Right: 27 sec.  sec; Left: 1 min. & 35 sec.  01/13/2023: 9 Hole Peg test: Right: 27 sec.  sec; Left: 51. Sec.  03/10/2023: 9 Hole Peg test: Right: 27 sec.  sec; Left: 54 Sec.  04/21/2023: 9 Hole Peg test: Right: 27 sec.  sec; Left: 46 Sec.  06/02/23: 9 hole Peg test: Left: 47 sec  06/15/23: 9 hole Peg test: Left: 45  sec  SENSATION: Light touch: WFL Proprioception: WFL   COGNITION: Overall cognitive status: Within functional limits for tasks assessed   VISION: Subjective report: Glasses. Just received a new prescription to increase bifocal strength Baseline vision: Macular Degeneration Visual history: macular degeneration   VISION ASSESSMENT: To be further assessed in functional context  Left sided Awareness     PERCEPTION: Limited left sided awareness   TODAY'S TREATMENT:   Therapeutic Activity:  Pt worked on B hand FMC skills grasping 1/2" pop beads. Pt worked on storing the objects in the palm and translatory skills moving the items from the palm of the hand to the tip of the 2nd digit and thumb. Utilized needle nose tweezers to place 1/8" hollow beads onto peg board. Cues to avoid proximal shoulder hiking. Stored up to 20 small 1/8" beads in hand and focused on dropping one at a time into container.   Self Care:  Pt completed typing practice for 2 paragraphs at 7 wpm and 59% accuracy. Noted errors arising from L pinky     PATIENT EDUCATION: Education details: progress towards goals Person educated: Patient Education method: verbal cues Education comprehension: verbalized understanding    HOME EXERCISE PROGRAM:  Reviewed home activities to enhance left hand digit extension during typing skills.   GOALS: Goals reviewed with patient? Yes     SHORT TERM GOALS: Target date: 07/21/23   Pt. Will improve FOTO score by 2 points to reflect improved pt. perceived functional performance Baseline: 06/16/2023: FOTO score: 70 06/02/23: 65; 04/21/2023: 49 03/10/2023: 59 01/13/2023: 49 12/01/2022: FOTO: 51 10/23/2022: FOTO 49 10/09/2022: FOTO 49, FOTO: 61 TR score: 62 Goal status: achieved/ongoing   LONG TERM GOALS: Target date: 08/25/2023   Pt. Will improve left shoulder ROM by 10 degrees to be able able to efficiently apply deodorant Baseline: 06/16/23: shoulder flexion 80 (108), abd 76(106) Pt.  Is able to apply deodorant to the left, however takes increased time.; 06/02/23: shoulder flexion 77 (104), abd 70 (100); 04/21/2023: shoulder flexion: 40(87), Abduction: 48(76) 03/10/2023: Shoulder flexion: 35(85) 01/13/2023:  Shoulder flexion: 35(82), Abduction: 47(70) 12/02/2022: Shoulder flexion: 32(82), Abduction: 45(60)  10/23/2022: Shoulder flexion: 26(80), Abduction: 32(50) 10/09/2022: flexion: 0(55), Abduction: 32(54) Goal status: Improving, ongoing   2.  Pt. will be able to independently hold and use a blow dryer, and brush for hair care. Baseline: 06/16/2023: Pt can hold a blow dryer but reaches only to L side of head and lower part of back of head with brush and blow dryer d/t limited  ER ROM and strength; 06/02/23: Pt can hold a blow dryer but reaches only to L side of head and lower part of back of head with brush and blow dryer d/t limited ER ROM and strength; 04/21/2023: Pt. is able to hold the hair dryer in the left hand, however is unable to reach up to dry it, and has difficulty using a brush with the left UE. 03/10/2023: Pt. continues to be able to hold the hair dryer in the left hand, however is unable to reach up to dry it, and has difficulty using a brush 01/13/2023: Pt. Can hold the hair dryer in the left hand, however is unable to reach up to dry it, and has difficulty using a brush. 12/02/2022: Pt. is unable to actively elevate he left shoulder in order to blow dry her hair. 10/23/2022: Pt. continues to be unable to hold a blow dryer. Eval: Pt. Is unable to sustain her BUEs in elevation, and use a blow dryer, and brush. 10/09/2022: Pt. Is unable to hold a blow dryer. Eval: Pt. Is unable to sustain her BUEs in elevation, and use a blow dryer, and brush. Goal status: improving/ongoing   3.  Pt. Will improve left lateral pinch strength by 3# to be able to independently cut meat. Baseline:06/16/2023: Lateral pinch: Left: 5 lbs. Pt. Has difficulty cutting steak, however is able to cut tender pieces  of chicken. 06/02/23: L lateral pinch: 6# (pt reports she lacks the strength to cut steak but can cut tender chicken); 04/21/2023: NT(Pinch Meter sent out for calibration) Pt. continues to have difficulty cutting meat. 03/10/2023: Left lateral pinch strength: 9#   01/13/2023: Left lateral pinch strength: 9#  12/02/2022: Left pinch: 7# Pt. has difficulty cutting meat. 10/23/2022: Pt. Continues to present with difficulty cutting meat. 10/09/2022: 1#  Eval: 2#. Pt. has difficulty stabilizing utensil, and food while cutting food. Goal status: Ongoing   4.  Pt. Will improve Left hand [facility] skills to be able to be able to independently, and efficiently manipulate small objects during ADL tasks. Baseline: 06/16/2023: Left 45 sec. 06/02/2023: L 47 sec; 04/21/2023:  46 sec. 03/10/2023: 54 sec. 01/13/2023: 51 sec. 12/02/2022: TBD  10/23/2022: Pt. Continues to present with difficulty manipulating small objects. 10/09/2022: Left: 1 min & 35 sec.: Eval: Left: 52 sec. Right: 27 sec. Goal status: Improving, Ongoing    6.: Pt. will improve with left grip strength to be able to hold and pull up pants Baseline:  06/16/2023: Left grip strength: 19# Pt. Is now able to pull her pants up.06/02/23: L grip 19#, improving; 04/21/2023: Pt. is able to pull up covers, however has difficulty pulling up pants. 03/10/2023: left  grip strength: 15# 01/13/2023: left  grip strength: 15# 12/02/2022: 17# 10/23/2022: Left: 16# 10/09/2022: Left: 10# Eval:  Left grip strength 11#. Pt. has difficulty holding a full drink  securely with the left hand.             Goal status: Improved   7. Pt. will improve typing speed, and accuracy in preparation for efficiently typing simple email message. Baseline: 06/16/2023:  Limited ability to carry-over practice at home due to Pt.'s mouse at home is not working. Pt. Also can not see the keys on her computer at home.  06/02/23: same as 04/21/23; 04/21/2023:  Pt. continues to present with limited speed, and accuracy with  typing  03/10/2023: Pt. continues to present with limited speed, and accuracy with typing 01/13/2023: TBD 12/02/2022:  Pt. continues to present with limited  speed, and accuracy with typing.10/23/2022: Pt. continues to present with limited speed, and accuracy with typing. 10/09/2022: Pt. presents with difficulty typing. Pt. presents with difficulty typing an email. Typing speed, and accuracy TBD             Goal status: Deferred                                CLINICAL IMPRESSION:  Pt reports her mouse is working so she will return to typing practice. Completed typing practice for 2 paragraphs at 7 wpm and 59% accuracy. Noted errors arising from L pinky consistently touching the wrong key - emphasized focus on accuracy over speed for home trials. Pt. continues to present with limited left shoulder ROM, limited LUE functional reach, limited grip strength, pinch strength, and FMC skills. Pt. continues to benefit from OT services to work towards improving Left UE functioning in order to maximize engagement in, and overall independence with ADLs, and IADL tasks, while reducing risk of adhesive capsulitis.     PERFORMANCE DEFICITS in functional skills including ADLs, IADLs, coordination, dexterity, sensation, ROM, strength, FMC, decreased knowledge of use of DME, vision, and UE functional use, cognitive skills including attention, and psychosocial skills including coping strategies, environmental adaptation, habits, and routines and behaviors.    IMPAIRMENTS are limiting patient from ADLs, IADLs, and social participation.    COMORBIDITIES may have co-morbidities  that affects occupational performance. Patient will benefit from skilled OT to address above impairments and improve overall function.   MODIFICATION OR ASSISTANCE TO COMPLETE EVALUATION: Min-Moderate modification of tasks or assist with assess necessary to complete an evaluation.   OT OCCUPATIONAL PROFILE AND HISTORY: Detailed assessment: Review of  records and additional review of physical, cognitive, psychosocial history related to current functional performance.   CLINICAL DECISION MAKING: Moderate - several treatment options, min-mod task modification necessary   REHAB POTENTIAL: Good   EVALUATION COMPLEXITY: Moderate      PLAN: OT FREQUENCY: 2x/week   OT DURATION: 12 weeks   PLANNED INTERVENTIONS: self care/ADL training, therapeutic exercise, therapeutic activity, neuromuscular re-education, manual therapy, passive range of motion, and paraffin   RECOMMENDED OTHER SERVICES:    CONSULTED AND AGREED WITH PLAN OF CARE: Patient   PLAN FOR NEXT SESSION:  Left hand strengthening and coordination exercises  Kathie Dike, M.S. OTR/L  06/23/23, 10:19 AM  ascom [phone]

## 2023-06-25 ENCOUNTER — Ambulatory Visit: Payer: Medicare HMO | Admitting: Physical Therapy

## 2023-06-25 ENCOUNTER — Ambulatory Visit: Payer: Medicare HMO | Admitting: Occupational Therapy

## 2023-06-25 DIAGNOSIS — M6281 Muscle weakness (generalized): Secondary | ICD-10-CM | POA: Diagnosis not present

## 2023-06-25 NOTE — Therapy (Addendum)
Occupational Therapy Treatment Note   Patient Name: Alyssa Mayo MRN: [id number] DOB:December 26, 1945, 77 y.o., female Today's Date: 03/14/2022   PCP: [name], MD REFERRING PROVIDER: [name], NP     OT End of Session - 06/25/23 1347     Visit Number 42    Number of Visits 81    Date for OT Re-Evaluation 08/25/23    Authorization Type Progress reporting period starting 04/21/23    OT Start Time 1015    OT Stop Time 1100    OT Time Calculation (min) 45 min    Activity Tolerance Patient tolerated treatment well    Behavior During Therapy [facility] for tasks assessed/performed                     Past Medical History:  Diagnosis Date   Abnormal levels of other serum enzymes 07/17/2015   Arthritis     Constipation 07/11/2015   COVID-19 03/06/2021   Diabetes mellitus without complication (HCC)     Elevated liver enzymes     Fatty liver     Generalized abdominal pain 07/11/2015   Hyperlipidemia     Hypertension     Lifelong obesity     Macular degeneration     Morbid obesity due to excess calories (HCC) 07/11/2015   Other fatigue 07/11/2015   Sleep apnea     Spinal headache           Past Surgical History:  Procedure Laterality Date   ABDOMINAL HYSTERECTOMY       APPENDECTOMY       CATARACT EXTRACTION       CERVICAL LAMINECTOMY   07/21/1985   CESAREAN SECTION       COLONOSCOPY   07/21/2012    diverticulosis, [facility] Dr. [name]    COLONOSCOPY WITH PROPOFOL N/A 02/04/2019    Procedure: COLONOSCOPY WITH PROPOFOL;  Surgeon: [name], MD;  Location: [facility] ENDOSCOPY;  Service: Endoscopy;  Laterality: N/A;   COLONOSCOPY WITH PROPOFOL N/A 03/18/2021    Procedure: COLONOSCOPY WITH PROPOFOL;  Surgeon: [name], MD;  Location: [facility] ENDOSCOPY;  Service: Endoscopy;  Laterality: N/A;  DM   DIAGNOSTIC LAPAROSCOPY       ESOPHAGOGASTRODUODENOSCOPY (EGD) WITH PROPOFOL N/A 02/04/2019    Procedure: ESOPHAGOGASTRODUODENOSCOPY (EGD) WITH PROPOFOL;  Surgeon:  [name], MD;  Location: [facility] ENDOSCOPY;  Service: Endoscopy;  Laterality: N/A;   ESOPHAGOGASTRODUODENOSCOPY (EGD) WITH PROPOFOL N/A 03/18/2021    Procedure: ESOPHAGOGASTRODUODENOSCOPY (EGD) WITH PROPOFOL;  Surgeon: [name], MD;  Location: [facility] ENDOSCOPY;  Service: Endoscopy;  Laterality: N/A;   LOOP RECORDER INSERTION N/A 08/05/2021    Procedure: LOOP RECORDER INSERTION;  Surgeon: [name], MD;  Location: [facility] INVASIVE CV LAB;  Service: Cardiovascular;  Laterality: N/A;   SPINE SURGERY       TUBAL LIGATION            Patient Active Problem List    Diagnosis Date Noted   Paroxysmal atrial fibrillation (HCC) 08/08/2022   Hypercoagulable state due to paroxysmal atrial fibrillation (HCC) 08/08/2022   Cerebral edema (HCC) 08/07/2021   OSA (obstructive sleep apnea) 08/07/2021   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   CVA (cerebral vascular accident) (HCC) 08/01/2021   GERD (gastroesophageal reflux disease) 07/20/2019   Advanced care planning/counseling discussion 12/17/2016   Skin lesions, generalized 11/13/2016   Chronic right hip pain 06/17/2016   Vitamin D deficiency 09/26/2015   Diastasis recti 08/08/2015   Elevated serum GGT level  07/24/2015   Essential hypertension 07/17/2015   Fatty liver 07/17/2015   Elevated alkaline phosphatase level 07/17/2015   Elevated serum glutamic pyruvic transaminase (SGPT) level 07/17/2015   Abnormal finding on EKG 07/11/2015   Morbid obesity (HCC) 07/11/2015   Colon, diverticulosis 07/11/2015   Abdominal wall hernia 07/11/2015   DM (diabetes mellitus), type 2 with neurological complications (HCC)     Hyperlipidemia      ONSET DATE: 08/01/2021   REFERRING DIAG: CVA   THERAPY DIAG:  Muscle weakness (generalized)   Rationale for Evaluation and Treatment Rehabilitation   SUBJECTIVE:   SUBJECTIVE STATEMENT:       Pt. reports she enjoyed time with her family over the holiday.   Pt accompanied by: self    PERTINENT HISTORY:  Pt. is a 77 y.o. female who had a unexpectedly hospitalized from 2/12-2/14/2024 for COVID-19, and a UTI.  Pt. 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 Pt. attended inpatient rehab from 08/07/2021-08/22/2021. Pt. received Home health therapy services this past spring.  Pt. has recently had an assessment through driver rehabilitation services at [facility] with recommendations for referrals for  outpatient OT/PT, and ST services. Pt. PMHx includes: HTN, Hyperlipidemia, Macular degeneration, DM, and Obesity.   PRECAUTIONS: None   WEIGHT BEARING RESTRICTIONS No   PAIN: Are you having pain? No pain  FALLS: Has patient fallen in last 6 months? Yes.   LIVING ENVIRONMENT: Lives with: lives with their family  Son  [name] Lives in: House/apartment Main living are on one floor Stairs:  2 steps to enter Has following equipment at home: Single point cane, Wheelchair (manual), shower chair, Grab bars, bed rail, and rubber mat.   PLOF: Independent   PATIENT GOALS: To be able to Drive, and cook again   OBJECTIVE:    HAND DOMINANCE: Right    ADLs:  Transfers/ambulation related to ADLs: Independent Eating: Independent Grooming: [employer] Pt. reports being unable to use her left hand to perform haircare.  UB Dressing: Independent pullover shirt, independent now with fastening a bra LB Dressing: Independent donning pants socks, and slide on shoes Toileting: Independent Bathing: Independent Tub Shower transfers: Walk-in shower Independent now    IADLs: Shopping: Needs to be accompanied to the grocery store. Light housekeeping: Do  Meal Prep:  Is able to perform light meal prep Community mobility: Relies on son, and friend Medication management: Son sets up Mining engineer management:TBD Handwriting: TBD   MOBILITY STATUS: Hx of falls out of bed     ACTIVITY TOLERANCE: Activity tolerance:  10-20 min. Before rest break    FUNCTIONAL OUTCOME MEASURES: FOTO: 61  TR score: 62  10/09/2022: FOTO: 49  (Recert) 06/02/23: 65   UPPER EXTREMITY ROM      Active ROM Right eval Left eval Left  10/09/2022 Left 10/23/22 Left 12/02/2022 Left 01/13/2023 Left  03/10/2023 Left 04/21/23 Left 05/12/23 Left 06/02/23 Left 06/16/23  Shoulder flexion WFL 54(74) scaption 0(55) 26(80) 32(82) 35(82) 35(85) 40(87) 70(96) 77 (104) 80(108)  Shoulder abduction WFL 58(75) 32(54) 32(50) 45(60) 47(70) 62(72) 48(76) 73(102) 70 (100) 76(106)  Shoulder adduction               Shoulder extension               Shoulder internal rotation            Thumb to L3 (T10) Thumb to L3 (T10)  Shoulder external rotation            (  With elbow at side) 45 (60) (With elbow at side) 47 (60)  Elbow flexion [facility] WFL 145 145 145 145 145 147  WFL WFL  Elbow extension [facility] WFL 10(0) 10(0) WFL WFL [facility]  [facility] WFL  Wrist flexion WFL TBD [facility]  WFL WFL  Wrist extension WFL TBD [facility] WFL [facility] WFL [facility]  WFL WFL  Wrist ulnar deviation               Wrist radial deviation               Wrist pronation             WFL  Wrist supination             WFL  (Blank rows = not tested)     UPPER EXTREMITY MMT:      MMT Right eval Left eval Left 10/09/2022 Left 10/23/2022 Left 12/02/2022 Left 01/13/2023 Left 03/10/2023 Left 04/21/23 Left 06/02/23 Left 06/16/23  Shoulder flexion 4+/5 2+/5 1/5 2-/5 2-/5 2-/5 2-/5 2-/5 2- 2+/5  Shoulder abduction 4+/5 2+/5 2-/5 2-/5 2-/5 2-/5 2-/5 2-/5 2- 2+/5  Shoulder adduction              Shoulder extension              Shoulder internal rotation           4- 4-/5  Shoulder external rotation           3- 3-/5  Middle trapezius              Lower trapezius              Elbow flexion 4+/5 3+/5 3/5 3/5 3+/5 3+/5 4/5 4/5 4+/5 4+/5  Elbow extension 4+/5 3+/5 3/5 3/5 3+/5 3+/5 4/5 4/5 4/5 4+/5  Wrist flexion 4+/5 TBD 3/5 3/5 3+/5 3+/5 4/5 4/5 4+/5 4+/5  Wrist extension 4+/5 TBD 3/5 3/5 3+/5 3+/5 4/5 4/5 4/5 4/5   Wrist ulnar deviation              Wrist radial deviation              Wrist pronation              Wrist supination              (Blank rows = not tested)   HAND FUNCTION: Grip strength: Right: 33 lbs; Left: 11 lbs, Lateral pinch: Right: 17 lbs, Left: 2 lbs, and 3 point pinch: Right: 17 lbs, Left: 2 lbs  10/09/2022: Grip strength: Right: 33 lbs; Left: 10 lbs, Lateral pinch: Right: 17 lbs, Left: 1 lbs, and 3 point pinch: Right: 17 lbs, Left: 1 lbs   10/23/2022: Grip strength: Right: 33 lbs; Left: 16 lbs, Lateral pinch: Right: 17 lbs, Left: 1 lbs, and 3 point pinch: Right: 17 lbs, Left: 1 lbs  12/02/2022: Grip strength: Right: 33 lbs; Left: 17 lbs, Lateral pinch: Right: 17 lbs, Left: 7 lbs, and 3 point pinch: Right: 17 lbs, Left: 3 lbs   01/13/2023: Grip strength: Right: 33 lbs; Left: 15 lbs, Lateral pinch: Right: 17 lbs, Left: 9 lbs, and 3 point pinch: Right: 17 lbs, Left: 7 lbs  03/10/2023: Grip strength: Right: 33 lbs; Left: 15 lbs, Lateral pinch: Right: 17 lbs, Left: 9 lbs, and 3 point pinch: Right: 17 lbs, Left: 7 lbs   04/21/2023: Grip strength: Right: 34 lbs; Left: 13 lbs, Lateral pinch: NT 3 point  pinch: Right: NT  06/02/23: Grip strength: Right: 34 lbs; Left: 19 lbs, Lateral pinch: Right: 16 lbs; Left: 6 lbs; 3 point pinch: Right: 13 lbs, Left: 7 lbs   06/16/2023: Grip strength: Right: 39 lbs; Left: 19 lbs, Lateral pinch: Right: 16 lbs; Left: 5 lbs; 3 point pinch: Right: 13 lbs, Left: 9 lbs      COORDINATION:   9 Hole Peg test: Right: 27 sec.  sec; Left: 52 sec.  10/09/2022: 9 Hole Peg test: Right: 27 sec.  sec; Left: 1 min. & 35 sec.  01/13/2023: 9 Hole Peg test: Right: 27 sec.  sec; Left: 51. Sec.  03/10/2023: 9 Hole Peg test: Right: 27 sec.  sec; Left: 54 Sec.  04/21/2023: 9 Hole Peg test: Right: 27 sec.  sec; Left: 46 Sec.  06/02/23: 9 hole Peg test: Left: 47 sec  06/15/23: 9 hole Peg test: Left: 45 sec  SENSATION: Light touch: WFL Proprioception: WFL    COGNITION: Overall cognitive status: Within functional limits for tasks assessed   VISION: Subjective report: Glasses. Just received a new prescription to increase bifocal strength Baseline vision: Macular Degeneration Visual history: macular degeneration   VISION ASSESSMENT: To be further assessed in functional context  Left sided Awareness     PERCEPTION: Limited left sided awareness   TODAY'S TREATMENT:   Therapeutic Exercise:   Pt. performed AAROM/AROM to the left shoulder 2/2 weakness, limited ROM, and stiffness. Pt. worked on [employer], and reciprocal motion using the UBE while seated for 8 min. with no resistance. Constant monitoring was provided.   Neuromuscular re-education:   Pt. worked on using her left hand for grasping, and manipulating 1/2" washers from a magnetic dish using point grasp pattern. Pt. worked on reaching up, stabilizing, and sustaining shoulder elevation while placing the washer over a small precise target on vertical dowels positioned at various angles.     PATIENT EDUCATION: Education details: progress towards goals Person educated: Patient Education method: verbal cues Education comprehension: verbalized understanding    HOME EXERCISE PROGRAM:  Reviewed home activities to enhance left hand digit extension during typing skills.   GOALS: Goals reviewed with patient? Yes     SHORT TERM GOALS: Target date: 07/21/23   Pt. Will improve FOTO score by 2 points to reflect improved pt. perceived functional performance Baseline: 06/16/2023: FOTO score: 70 06/02/23: 65; 04/21/2023: 49 03/10/2023: 59 01/13/2023: 49 12/01/2022: FOTO: 51 10/23/2022: FOTO 49 10/09/2022: FOTO 49, FOTO: 61 TR score: 62 Goal status: achieved/ongoing   LONG TERM GOALS: Target date: 08/25/2023   Pt. Will improve left shoulder ROM by 10 degrees to be able able to efficiently apply deodorant Baseline: 06/16/23: shoulder flexion 80 (108), abd 76(106) Pt. Is able to apply  deodorant to the left, however takes increased time.; 06/02/23: shoulder flexion 77 (104), abd 70 (100); 04/21/2023: shoulder flexion: 40(87), Abduction: 48(76) 03/10/2023: Shoulder flexion: 35(85) 01/13/2023:  Shoulder flexion: 35(82), Abduction: 47(70) 12/02/2022: Shoulder flexion: 32(82), Abduction: 45(60)  10/23/2022: Shoulder flexion: 26(80), Abduction: 32(50) 10/09/2022: flexion: 0(55), Abduction: 32(54) Goal status: Improving, ongoing   2.  Pt. will be able to independently hold and use a blow dryer, and brush for hair care. Baseline: 06/16/2023: Pt can hold a blow dryer but reaches only to L side of head and lower part of back of head with brush and blow dryer d/t limited ER ROM and strength; 06/02/23: Pt can hold a blow dryer but reaches only to L side of head and lower part of back of head  with brush and blow dryer d/t limited ER ROM and strength; 04/21/2023: Pt. is able to hold the hair dryer in the left hand, however is unable to reach up to dry it, and has difficulty using a brush with the left UE. 03/10/2023: Pt. continues to be able to hold the hair dryer in the left hand, however is unable to reach up to dry it, and has difficulty using a brush 01/13/2023: Pt. Can hold the hair dryer in the left hand, however is unable to reach up to dry it, and has difficulty using a brush. 12/02/2022: Pt. is unable to actively elevate he left shoulder in order to blow dry her hair. 10/23/2022: Pt. continues to be unable to hold a blow dryer. Eval: Pt. Is unable to sustain her BUEs in elevation, and use a blow dryer, and brush. 10/09/2022: Pt. Is unable to hold a blow dryer. Eval: Pt. Is unable to sustain her BUEs in elevation, and use a blow dryer, and brush. Goal status: improving/ongoing   3.  Pt. Will improve left lateral pinch strength by 3# to be able to independently cut meat. Baseline:06/16/2023: Lateral pinch: Left: 5 lbs. Pt. Has difficulty cutting steak, however is able to cut tender pieces of chicken.  06/02/23: L lateral pinch: 6# (pt reports she lacks the strength to cut steak but can cut tender chicken); 04/21/2023: NT(Pinch Meter sent out for calibration) Pt. continues to have difficulty cutting meat. 03/10/2023: Left lateral pinch strength: 9#   01/13/2023: Left lateral pinch strength: 9#  12/02/2022: Left pinch: 7# Pt. has difficulty cutting meat. 10/23/2022: Pt. Continues to present with difficulty cutting meat. 10/09/2022: 1#  Eval: 2#. Pt. has difficulty stabilizing utensil, and food while cutting food. Goal status: Ongoing   4.  Pt. Will improve Left hand [facility] skills to be able to be able to independently, and efficiently manipulate small objects during ADL tasks. Baseline: 06/16/2023: Left 45 sec. 06/02/2023: L 47 sec; 04/21/2023:  46 sec. 03/10/2023: 54 sec. 01/13/2023: 51 sec. 12/02/2022: TBD  10/23/2022: Pt. Continues to present with difficulty manipulating small objects. 10/09/2022: Left: 1 min & 35 sec.: Eval: Left: 52 sec. Right: 27 sec. Goal status: Improving, Ongoing    6.: Pt. will improve with left grip strength to be able to hold and pull up pants Baseline:  06/16/2023: Left grip strength: 19# Pt. Is now able to pull her pants up.06/02/23: L grip 19#, improving; 04/21/2023: Pt. is able to pull up covers, however has difficulty pulling up pants. 03/10/2023: left  grip strength: 15# 01/13/2023: left  grip strength: 15# 12/02/2022: 17# 10/23/2022: Left: 16# 10/09/2022: Left: 10# Eval:  Left grip strength 11#. Pt. has difficulty holding a full drink  securely with the left hand.             Goal status: Improved   7. Pt. will improve typing speed, and accuracy in preparation for efficiently typing simple email message. Baseline: 06/16/2023:  Limited ability to carry-over practice at home due to Pt.'s mouse at home is not working. Pt. Also can not see the keys on her computer at home.  06/02/23: same as 04/21/23; 04/21/2023:  Pt. continues to present with limited speed, and accuracy with typing   03/10/2023: Pt. continues to present with limited speed, and accuracy with typing 01/13/2023: TBD 12/02/2022:  Pt. continues to present with limited speed, and accuracy with typing.10/23/2022: Pt. continues to present with limited speed, and accuracy with typing. 10/09/2022: Pt. presents with difficulty typing. Pt. presents with difficulty  typing an email. Typing speed, and accuracy TBD             Goal status: Deferred                                CLINICAL IMPRESSION:  Pt. continues to make progress with LUE functioning. Pt. reports that she is now able to use her left arm to move a chair in, and out from a table consistently. Pt. requires cues initially to avoid compensating proximally with leaning to the right when reaching up to place the washers onto the vertical dowels. Cues were tapered off, as the task progressed. Pt. Required support under her left elbow when  Pt. continues to benefit from OT services to work towards improving Left UE functioning in order to maximize engagement in, and overall independence with ADLs, and IADL tasks, while reducing risk of adhesive capsulitis.     PERFORMANCE DEFICITS in functional skills including ADLs, IADLs, coordination, dexterity, sensation, ROM, strength, FMC, decreased knowledge of use of DME, vision, and UE functional use, cognitive skills including attention, and psychosocial skills including coping strategies, environmental adaptation, habits, and routines and behaviors.    IMPAIRMENTS are limiting patient from ADLs, IADLs, and social participation.    COMORBIDITIES may have co-morbidities  that affects occupational performance. Patient will benefit from skilled OT to address above impairments and improve overall function.   MODIFICATION OR ASSISTANCE TO COMPLETE EVALUATION: Min-Moderate modification of tasks or assist with assess necessary to complete an evaluation.   OT OCCUPATIONAL PROFILE AND HISTORY: Detailed assessment: Review of records and  additional review of physical, cognitive, psychosocial history related to current functional performance.   CLINICAL DECISION MAKING: Moderate - several treatment options, min-mod task modification necessary   REHAB POTENTIAL: Good   EVALUATION COMPLEXITY: Moderate      PLAN: OT FREQUENCY: 2x/week   OT DURATION: 12 weeks   PLANNED INTERVENTIONS: self care/ADL training, therapeutic exercise, therapeutic activity, neuromuscular re-education, manual therapy, passive range of motion, and paraffin   RECOMMENDED OTHER SERVICES:    CONSULTED AND AGREED WITH PLAN OF CARE: Patient   PLAN FOR NEXT SESSION:  Left hand strengthening and coordination exercises  Olegario Messier, MS, OTR/L    06/25/23, 1:50 PM

## 2023-06-30 ENCOUNTER — Ambulatory Visit: Payer: Medicare HMO | Admitting: Physical Therapy

## 2023-06-30 ENCOUNTER — Ambulatory Visit: Payer: Medicare HMO | Admitting: Occupational Therapy

## 2023-06-30 DIAGNOSIS — M6281 Muscle weakness (generalized): Secondary | ICD-10-CM | POA: Diagnosis not present

## 2023-06-30 DIAGNOSIS — R278 Other lack of coordination: Secondary | ICD-10-CM

## 2023-06-30 NOTE — Therapy (Signed)
Occupational Therapy Treatment Note   Patient Name: Alyssa Mayo MRN: [id number] DOB:[birth year], 77 y.o., female Today's Date: 03/14/2022   PCP: [name], MD REFERRING PROVIDER: [name], NP     OT End of Session - 06/30/23 1108     Visit Number 43    Number of Visits 81    Date for OT Re-Evaluation 08/25/23    Authorization Type Progress reporting period starting 06/16/2023    OT Start Time 1015    OT Stop Time 1100    OT Time Calculation (min) 45 min    Activity Tolerance Patient tolerated treatment well    Behavior During Therapy [facility] for tasks assessed/performed                     Past Medical History:  Diagnosis Date   Abnormal levels of other serum enzymes 07/17/2015   Arthritis     Constipation 07/11/2015   COVID-19 03/06/2021   Diabetes mellitus without complication (HCC)     Elevated liver enzymes     Fatty liver     Generalized abdominal pain 07/11/2015   Hyperlipidemia     Hypertension     Lifelong obesity     Macular degeneration     Morbid obesity due to excess calories (HCC) 07/11/2015   Other fatigue 07/11/2015   Sleep apnea     Spinal headache           Past Surgical History:  Procedure Laterality Date   ABDOMINAL HYSTERECTOMY       APPENDECTOMY       CATARACT EXTRACTION       CERVICAL LAMINECTOMY   07/21/1985   CESAREAN SECTION       COLONOSCOPY   07/21/2012    diverticulosis, [facility] Dr. [name]    COLONOSCOPY WITH PROPOFOL N/A 02/04/2019    Procedure: COLONOSCOPY WITH PROPOFOL;  Surgeon: [name], MD;  Location: [facility] ENDOSCOPY;  Service: Endoscopy;  Laterality: N/A;   COLONOSCOPY WITH PROPOFOL N/A 03/18/2021    Procedure: COLONOSCOPY WITH PROPOFOL;  Surgeon: [name], MD;  Location: [facility] ENDOSCOPY;  Service: Endoscopy;  Laterality: N/A;  DM   DIAGNOSTIC LAPAROSCOPY       ESOPHAGOGASTRODUODENOSCOPY (EGD) WITH PROPOFOL N/A 02/04/2019    Procedure: ESOPHAGOGASTRODUODENOSCOPY (EGD) WITH PROPOFOL;  Surgeon:  [name], MD;  Location: [facility] ENDOSCOPY;  Service: Endoscopy;  Laterality: N/A;   ESOPHAGOGASTRODUODENOSCOPY (EGD) WITH PROPOFOL N/A 03/18/2021    Procedure: ESOPHAGOGASTRODUODENOSCOPY (EGD) WITH PROPOFOL;  Surgeon: [name], MD;  Location: [facility] ENDOSCOPY;  Service: Endoscopy;  Laterality: N/A;   LOOP RECORDER INSERTION N/A 08/05/2021    Procedure: LOOP RECORDER INSERTION;  Surgeon: [name], MD;  Location: [facility] INVASIVE CV LAB;  Service: Cardiovascular;  Laterality: N/A;   SPINE SURGERY       TUBAL LIGATION            Patient Active Problem List    Diagnosis Date Noted   Paroxysmal atrial fibrillation (HCC) 08/08/2022   Hypercoagulable state due to paroxysmal atrial fibrillation (HCC) 08/08/2022   Cerebral edema (HCC) 08/07/2021   OSA (obstructive sleep apnea) 08/07/2021   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   CVA (cerebral vascular accident) (HCC) 08/01/2021   GERD (gastroesophageal reflux disease) 07/20/2019   Advanced care planning/counseling discussion 12/17/2016   Skin lesions, generalized 11/13/2016   Chronic right hip pain 06/17/2016   Vitamin D deficiency 09/26/2015   Diastasis recti 08/08/2015   Elevated serum GGT level  07/24/2015   Essential hypertension 07/17/2015   Fatty liver 07/17/2015   Elevated alkaline phosphatase level 07/17/2015   Elevated serum glutamic pyruvic transaminase (SGPT) level 07/17/2015   Abnormal finding on EKG 07/11/2015   Morbid obesity (HCC) 07/11/2015   Colon, diverticulosis 07/11/2015   Abdominal wall hernia 07/11/2015   DM (diabetes mellitus), type 2 with neurological complications (HCC)     Hyperlipidemia      ONSET DATE: 08/01/2021   REFERRING DIAG: CVA   THERAPY DIAG:  Muscle weakness (generalized)   Rationale for Evaluation and Treatment Rehabilitation   SUBJECTIVE:   SUBJECTIVE STATEMENT:       Pt. reports she enjoyed time with her family over the holiday.   Pt accompanied by: self    PERTINENT HISTORY:  Pt. is a 77 y.o. female who had a unexpectedly hospitalized from 2/12-2/14/2024 for COVID-19, and a UTI.  Pt. 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 Pt. attended inpatient rehab from 08/07/2021-08/22/2021. Pt. received Home health therapy services this past spring.  Pt. has recently had an assessment through driver rehabilitation services at [facility] with recommendations for referrals for  outpatient OT/PT, and ST services. Pt. PMHx includes: HTN, Hyperlipidemia, Macular degeneration, DM, and Obesity.   PRECAUTIONS: None   WEIGHT BEARING RESTRICTIONS No   PAIN: Are you having pain? No pain  FALLS: Has patient fallen in last 6 months? Yes.   LIVING ENVIRONMENT: Lives with: lives with their family  Son  [name] Lives in: House/apartment Main living are on one floor Stairs:  2 steps to enter Has following equipment at home: Single point cane, Wheelchair (manual), shower chair, Grab bars, bed rail, and rubber mat.   PLOF: Independent   PATIENT GOALS: To be able to Drive, and cook again   OBJECTIVE:    HAND DOMINANCE: Right    ADLs:  Transfers/ambulation related to ADLs: Independent Eating: Independent Grooming: [employer] Pt. reports being unable to use her left hand to perform haircare.  UB Dressing: Independent pullover shirt, independent now with fastening a bra LB Dressing: Independent donning pants socks, and slide on shoes Toileting: Independent Bathing: Independent Tub Shower transfers: Walk-in shower Independent now    IADLs: Shopping: Needs to be accompanied to the grocery store. Light housekeeping: Do  Meal Prep:  Is able to perform light meal prep Community mobility: Relies on son, and friend Medication management: Son sets up Mining engineer management:TBD Handwriting: TBD   MOBILITY STATUS: Hx of falls out of bed     ACTIVITY TOLERANCE: Activity tolerance:  10-20 min. Before rest break    FUNCTIONAL OUTCOME MEASURES: FOTO: 61  TR score: 62  10/09/2022: FOTO: 49  (Recert) 06/02/23: 65   UPPER EXTREMITY ROM      Active ROM Right eval Left eval Left  10/09/2022 Left 10/23/22 Left 12/02/2022 Left 01/13/2023 Left  03/10/2023 Left 04/21/23 Left 05/12/23 Left 06/02/23 Left 06/16/23  Shoulder flexion WFL 54(74) scaption 0(55) 26(80) 32(82) 35(82) 35(85) 40(87) 70(96) 77 (104) 80(108)  Shoulder abduction WFL 58(75) 32(54) 32(50) 45(60) 47(70) 62(72) 48(76) 73(102) 70 (100) 76(106)  Shoulder adduction               Shoulder extension               Shoulder internal rotation            Thumb to L3 (T10) Thumb to L3 (T10)  Shoulder external rotation            (  With elbow at side) 45 (60) (With elbow at side) 47 (60)  Elbow flexion [facility] WFL 145 145 145 145 145 147  WFL WFL  Elbow extension [facility] WFL 10(0) 10(0) WFL WFL [facility] WFL  Wrist flexion WFL TBD [facility]  WFL WFL  Wrist extension WFL TBD [facility] WFL [facility] WFL [facility]  WFL WFL  Wrist ulnar deviation               Wrist radial deviation               Wrist pronation             WFL  Wrist supination             WFL  (Blank rows = not tested)     UPPER EXTREMITY MMT:      MMT Right eval Left eval Left 10/09/2022 Left 10/23/2022 Left 12/02/2022 Left 01/13/2023 Left 03/10/2023 Left 04/21/23 Left 06/02/23 Left 06/16/23  Shoulder flexion 4+/5 2+/5 1/5 2-/5 2-/5 2-/5 2-/5 2-/5 2- 2+/5  Shoulder abduction 4+/5 2+/5 2-/5 2-/5 2-/5 2-/5 2-/5 2-/5 2- 2+/5  Shoulder adduction              Shoulder extension              Shoulder internal rotation           4- 4-/5  Shoulder external rotation           3- 3-/5  Middle trapezius              Lower trapezius              Elbow flexion 4+/5 3+/5 3/5 3/5 3+/5 3+/5 4/5 4/5 4+/5 4+/5  Elbow extension 4+/5 3+/5 3/5 3/5 3+/5 3+/5 4/5 4/5 4/5 4+/5  Wrist flexion 4+/5 TBD 3/5 3/5 3+/5 3+/5 4/5 4/5 4+/5 4+/5  Wrist extension 4+/5 TBD 3/5 3/5 3+/5 3+/5 4/5 4/5 4/5 4/5   Wrist ulnar deviation              Wrist radial deviation              Wrist pronation              Wrist supination              (Blank rows = not tested)   HAND FUNCTION: Grip strength: Right: 33 lbs; Left: 11 lbs, Lateral pinch: Right: 17 lbs, Left: 2 lbs, and 3 point pinch: Right: 17 lbs, Left: 2 lbs  10/09/2022: Grip strength: Right: 33 lbs; Left: 10 lbs, Lateral pinch: Right: 17 lbs, Left: 1 lbs, and 3 point pinch: Right: 17 lbs, Left: 1 lbs   10/23/2022: Grip strength: Right: 33 lbs; Left: 16 lbs, Lateral pinch: Right: 17 lbs, Left: 1 lbs, and 3 point pinch: Right: 17 lbs, Left: 1 lbs  12/02/2022: Grip strength: Right: 33 lbs; Left: 17 lbs, Lateral pinch: Right: 17 lbs, Left: 7 lbs, and 3 point pinch: Right: 17 lbs, Left: 3 lbs   01/13/2023: Grip strength: Right: 33 lbs; Left: 15 lbs, Lateral pinch: Right: 17 lbs, Left: 9 lbs, and 3 point pinch: Right: 17 lbs, Left: 7 lbs  03/10/2023: Grip strength: Right: 33 lbs; Left: 15 lbs, Lateral pinch: Right: 17 lbs, Left: 9 lbs, and 3 point pinch: Right: 17 lbs, Left: 7 lbs   04/21/2023: Grip strength: Right: 34 lbs; Left: 13 lbs, Lateral pinch: NT 3 point  pinch: Right: NT  06/02/23: Grip strength: Right: 34 lbs; Left: 19 lbs, Lateral pinch: Right: 16 lbs; Left: 6 lbs; 3 point pinch: Right: 13 lbs, Left: 7 lbs   06/16/2023: Grip strength: Right: 39 lbs; Left: 19 lbs, Lateral pinch: Right: 16 lbs; Left: 5 lbs; 3 point pinch: Right: 13 lbs, Left: 9 lbs      COORDINATION:   9 Hole Peg test: Right: 27 sec.  sec; Left: 52 sec.  10/09/2022: 9 Hole Peg test: Right: 27 sec.  sec; Left: 1 min. & 35 sec.  01/13/2023: 9 Hole Peg test: Right: 27 sec.  sec; Left: 51. Sec.  03/10/2023: 9 Hole Peg test: Right: 27 sec.  sec; Left: 54 Sec.  04/21/2023: 9 Hole Peg test: Right: 27 sec.  sec; Left: 46 Sec.  06/02/23: 9 hole Peg test: Left: 47 sec  06/15/23: 9 hole Peg test: Left: 45 sec  SENSATION: Light touch: WFL Proprioception: WFL    COGNITION: Overall cognitive status: Within functional limits for tasks assessed   VISION: Subjective report: Glasses. Just received a new prescription to increase bifocal strength Baseline vision: Macular Degeneration Visual history: macular degeneration   VISION ASSESSMENT: To be further assessed in functional context  Left sided Awareness     PERCEPTION: Limited left sided awareness   TODAY'S TREATMENT:   Therapeutic Exercise:   Pt. performed AAROM/AROM to the left shoulder 2/2 weakness, limited ROM, and stiffness. AAROM for the LUE at the tabletop surface with cues for slow prolonged stretching. Pt. worked on [employer], and reciprocal motion using the UBE while seated for 8 min. with no resistance. Constant monitoring was provided. Pt. Worked on pinch strengthening in the left hand for lateral, and 3pt. pinch using yellow, red, and green resistive clips. Pt. worked on placing the clips at various vertical and horizontal angles. Tactile and verbal cues were required for eliciting the desired movement.  Neuromuscular re-education:   Pt. Completed 18 blocks with the left hand in 1 min., and 32 blocks with the right hand in 1 min.    PATIENT EDUCATION: Education details: progress towards goals Person educated: Patient Education method: verbal cues Education comprehension: verbalized understanding    HOME EXERCISE PROGRAM:  Reviewed home activities to enhance left hand digit extension during typing skills.   GOALS: Goals reviewed with patient? Yes     SHORT TERM GOALS: Target date: 07/21/23   Pt. Will improve FOTO score by 2 points to reflect improved pt. perceived functional performance Baseline: 06/16/2023: FOTO score: 70 06/02/23: 65; 04/21/2023: 49 03/10/2023: 59 01/13/2023: 49 12/01/2022: FOTO: 51 10/23/2022: FOTO 49 10/09/2022: FOTO 49, FOTO: 61 TR score: 62 Goal status: achieved/ongoing   LONG TERM GOALS: Target date: 08/25/2023   Pt. Will improve left shoulder  ROM by 10 degrees to be able able to efficiently apply deodorant Baseline: 06/16/23: shoulder flexion 80 (108), abd 76(106) Pt. Is able to apply deodorant to the left, however takes increased time.; 06/02/23: shoulder flexion 77 (104), abd 70 (100); 04/21/2023: shoulder flexion: 40(87), Abduction: 48(76) 03/10/2023: Shoulder flexion: 35(85) 01/13/2023:  Shoulder flexion: 35(82), Abduction: 47(70) 12/02/2022: Shoulder flexion: 32(82), Abduction: 45(60)  10/23/2022: Shoulder flexion: 26(80), Abduction: 32(50) 10/09/2022: flexion: 0(55), Abduction: 32(54) Goal status: Improving, ongoing   2.  Pt. will be able to independently hold and use a blow dryer, and brush for hair care. Baseline: 06/16/2023: Pt can hold a blow dryer but reaches only to L side of head and lower part of back of head with brush and  blow dryer d/t limited ER ROM and strength; 06/02/23: Pt can hold a blow dryer but reaches only to L side of head and lower part of back of head with brush and blow dryer d/t limited ER ROM and strength; 04/21/2023: Pt. is able to hold the hair dryer in the left hand, however is unable to reach up to dry it, and has difficulty using a brush with the left UE. 03/10/2023: Pt. continues to be able to hold the hair dryer in the left hand, however is unable to reach up to dry it, and has difficulty using a brush 01/13/2023: Pt. Can hold the hair dryer in the left hand, however is unable to reach up to dry it, and has difficulty using a brush. 12/02/2022: Pt. is unable to actively elevate he left shoulder in order to blow dry her hair. 10/23/2022: Pt. continues to be unable to hold a blow dryer. Eval: Pt. Is unable to sustain her BUEs in elevation, and use a blow dryer, and brush. 10/09/2022: Pt. Is unable to hold a blow dryer. Eval: Pt. Is unable to sustain her BUEs in elevation, and use a blow dryer, and brush. Goal status: improving/ongoing   3.  Pt. Will improve left lateral pinch strength by 3# to be able to independently  cut meat. Baseline:06/16/2023: Lateral pinch: Left: 5 lbs. Pt. Has difficulty cutting steak, however is able to cut tender pieces of chicken. 06/02/23: L lateral pinch: 6# (pt reports she lacks the strength to cut steak but can cut tender chicken); 04/21/2023: NT(Pinch Meter sent out for calibration) Pt. continues to have difficulty cutting meat. 03/10/2023: Left lateral pinch strength: 9#   01/13/2023: Left lateral pinch strength: 9#  12/02/2022: Left pinch: 7# Pt. has difficulty cutting meat. 10/23/2022: Pt. Continues to present with difficulty cutting meat. 10/09/2022: 1#  Eval: 2#. Pt. has difficulty stabilizing utensil, and food while cutting food. Goal status: Ongoing   4.  Pt. Will improve Left hand [facility] skills to be able to be able to independently, and efficiently manipulate small objects during ADL tasks. Baseline: 06/16/2023: Left 45 sec. 06/02/2023: L 47 sec; 04/21/2023:  46 sec. 03/10/2023: 54 sec. 01/13/2023: 51 sec. 12/02/2022: TBD  10/23/2022: Pt. Continues to present with difficulty manipulating small objects. 10/09/2022: Left: 1 min & 35 sec.: Eval: Left: 52 sec. Right: 27 sec. Goal status: Improving, Ongoing    6.: Pt. will improve with left grip strength to be able to hold and pull up pants Baseline:  06/16/2023: Left grip strength: 19# Pt. Is now able to pull her pants up.06/02/23: L grip 19#, improving; 04/21/2023: Pt. is able to pull up covers, however has difficulty pulling up pants. 03/10/2023: left  grip strength: 15# 01/13/2023: left  grip strength: 15# 12/02/2022: 17# 10/23/2022: Left: 16# 10/09/2022: Left: 10# Eval:  Left grip strength 11#. Pt. has difficulty holding a full drink  securely with the left hand.             Goal status: Improved   7. Pt. will improve typing speed, and accuracy in preparation for efficiently typing simple email message. Baseline: 06/16/2023:  Limited ability to carry-over practice at home due to Pt.'s mouse at home is not working. Pt. Also can not see the keys  on her computer at home.  06/02/23: same as 04/21/23; 04/21/2023:  Pt. continues to present with limited speed, and accuracy with typing  03/10/2023: Pt. continues to present with limited speed, and accuracy with typing 01/13/2023: TBD 12/02/2022:  Pt. continues  to present with limited speed, and accuracy with typing.10/23/2022: Pt. continues to present with limited speed, and accuracy with typing. 10/09/2022: Pt. presents with difficulty typing. Pt. presents with difficulty typing an email. Typing speed, and accuracy TBD             Goal status: Deferred                                CLINICAL IMPRESSION:  Pt. tolerated manual therapy, and LUE exercises well today. Pt. was able to complex 18 blocks in 1 min. with the left hand, and 32 blocks in 1 min. with the right  hand on the Box and Blocks Test.  Pt. Requires cues for lateral, and 3 pt. pinch position on the resistive clips. Pt. continues to present with limited LUE  reach. Pt. continues to work on improving, and maximizing independence with ADLs, and IADL tasks. Pt. continues to benefit from OT services to work towards improving Left UE functioning in order to maximize engagement in, and overall independence with ADLs, and IADL tasks, while reducing risk of adhesive capsulitis.     PERFORMANCE DEFICITS in functional skills including ADLs, IADLs, coordination, dexterity, sensation, ROM, strength, FMC, decreased knowledge of use of DME, vision, and UE functional use, cognitive skills including attention, and psychosocial skills including coping strategies, environmental adaptation, habits, and routines and behaviors.    IMPAIRMENTS are limiting patient from ADLs, IADLs, and social participation.    COMORBIDITIES may have co-morbidities  that affects occupational performance. Patient will benefit from skilled OT to address above impairments and improve overall function.   MODIFICATION OR ASSISTANCE TO COMPLETE EVALUATION: Min-Moderate modification of  tasks or assist with assess necessary to complete an evaluation.   OT OCCUPATIONAL PROFILE AND HISTORY: Detailed assessment: Review of records and additional review of physical, cognitive, psychosocial history related to current functional performance.   CLINICAL DECISION MAKING: Moderate - several treatment options, min-mod task modification necessary   REHAB POTENTIAL: Good   EVALUATION COMPLEXITY: Moderate      PLAN: OT FREQUENCY: 2x/week   OT DURATION: 12 weeks   PLANNED INTERVENTIONS: self care/ADL training, therapeutic exercise, therapeutic activity, neuromuscular re-education, manual therapy, passive range of motion, and paraffin   RECOMMENDED OTHER SERVICES:    CONSULTED AND AGREED WITH PLAN OF CARE: Patient   PLAN FOR NEXT SESSION:  Left hand strengthening and coordination exercises  Olegario Messier, MS, OTR/L    06/30/23, 11:18 AM

## 2023-07-02 ENCOUNTER — Ambulatory Visit: Payer: Medicare HMO | Admitting: Physical Therapy

## 2023-07-02 ENCOUNTER — Ambulatory Visit: Payer: Medicare HMO | Admitting: Occupational Therapy

## 2023-07-02 DIAGNOSIS — M6281 Muscle weakness (generalized): Secondary | ICD-10-CM

## 2023-07-02 NOTE — Therapy (Signed)
Occupational Therapy Treatment Note   Patient Name: Alyssa Mayo MRN: [id number] DOB:09/12/1945, 77 y.o., female Today's Date: 03/14/2022   PCP: [name], MD REFERRING PROVIDER: [name], NP     OT End of Session - 07/02/23 1225     Visit Number 44    Date for OT Re-Evaluation 08/25/23    Authorization Type Progress reporting period starting 06/16/2023    OT Start Time 1015    OT Stop Time 1100    OT Time Calculation (min) 45 min    Activity Tolerance Patient tolerated treatment well    Behavior During Therapy [facility] for tasks assessed/performed                     Past Medical History:  Diagnosis Date   Abnormal levels of other serum enzymes 07/17/2015   Arthritis     Constipation 07/11/2015   COVID-19 03/06/2021   Diabetes mellitus without complication (HCC)     Elevated liver enzymes     Fatty liver     Generalized abdominal pain 07/11/2015   Hyperlipidemia     Hypertension     Lifelong obesity     Macular degeneration     Morbid obesity due to excess calories (HCC) 07/11/2015   Other fatigue 07/11/2015   Sleep apnea     Spinal headache           Past Surgical History:  Procedure Laterality Date   ABDOMINAL HYSTERECTOMY       APPENDECTOMY       CATARACT EXTRACTION       CERVICAL LAMINECTOMY   07/21/1985   CESAREAN SECTION       COLONOSCOPY   07/21/2012    diverticulosis, [facility] Dr. [name]    COLONOSCOPY WITH PROPOFOL N/A 02/04/2019    Procedure: COLONOSCOPY WITH PROPOFOL;  Surgeon: [name], MD;  Location: [facility] ENDOSCOPY;  Service: Endoscopy;  Laterality: N/A;   COLONOSCOPY WITH PROPOFOL N/A 03/18/2021    Procedure: COLONOSCOPY WITH PROPOFOL;  Surgeon: [name], MD;  Location: [facility] ENDOSCOPY;  Service: Endoscopy;  Laterality: N/A;  DM   DIAGNOSTIC LAPAROSCOPY       ESOPHAGOGASTRODUODENOSCOPY (EGD) WITH PROPOFOL N/A 02/04/2019    Procedure: ESOPHAGOGASTRODUODENOSCOPY (EGD) WITH PROPOFOL;  Surgeon: [name], MD;   Location: [facility] ENDOSCOPY;  Service: Endoscopy;  Laterality: N/A;   ESOPHAGOGASTRODUODENOSCOPY (EGD) WITH PROPOFOL N/A 03/18/2021    Procedure: ESOPHAGOGASTRODUODENOSCOPY (EGD) WITH PROPOFOL;  Surgeon: [name], MD;  Location: [facility] ENDOSCOPY;  Service: Endoscopy;  Laterality: N/A;   LOOP RECORDER INSERTION N/A 08/05/2021    Procedure: LOOP RECORDER INSERTION;  Surgeon: [name], MD;  Location: [facility] INVASIVE CV LAB;  Service: Cardiovascular;  Laterality: N/A;   SPINE SURGERY       TUBAL LIGATION            Patient Active Problem List    Diagnosis Date Noted   Paroxysmal atrial fibrillation (HCC) 08/08/2022   Hypercoagulable state due to paroxysmal atrial fibrillation (HCC) 08/08/2022   Cerebral edema (HCC) 08/07/2021   OSA (obstructive sleep apnea) 08/07/2021   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   CVA (cerebral vascular accident) (HCC) 08/01/2021   GERD (gastroesophageal reflux disease) 07/20/2019   Advanced care planning/counseling discussion 12/17/2016   Skin lesions, generalized 11/13/2016   Chronic right hip pain 06/17/2016   Vitamin D deficiency 09/26/2015   Diastasis recti 08/08/2015   Elevated serum GGT level 07/24/2015   Essential hypertension 07/17/2015  Fatty liver 07/17/2015   Elevated alkaline phosphatase level 07/17/2015   Elevated serum glutamic pyruvic transaminase (SGPT) level 07/17/2015   Abnormal finding on EKG 07/11/2015   Morbid obesity (HCC) 07/11/2015   Colon, diverticulosis 07/11/2015   Abdominal wall hernia 07/11/2015   DM (diabetes mellitus), type 2 with neurological complications (HCC)     Hyperlipidemia      ONSET DATE: 08/01/2021   REFERRING DIAG: CVA   THERAPY DIAG:  Muscle weakness (generalized)   Rationale for Evaluation and Treatment Rehabilitation   SUBJECTIVE:   SUBJECTIVE STATEMENT:       Pt. reports ding well today  Pt accompanied by: self   PERTINENT HISTORY:  Pt. is a 77 y.o. female who had a  unexpectedly hospitalized from 2/12-2/14/2024 for COVID-19, and a UTI.  Pt. 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 Pt. attended inpatient rehab from 08/07/2021-08/22/2021. Pt. received Home health therapy services this past spring.  Pt. has recently had an assessment through driver rehabilitation services at [facility] with recommendations for referrals for  outpatient OT/PT, and ST services. Pt. PMHx includes: HTN, Hyperlipidemia, Macular degeneration, DM, and Obesity.   PRECAUTIONS: None   WEIGHT BEARING RESTRICTIONS No   PAIN: Are you having pain? <1/10 left shoulder  FALLS: Has patient fallen in last 6 months? Yes.   LIVING ENVIRONMENT: Lives with: lives with their family  Son  [name] Lives in: House/apartment Main living are on one floor Stairs:  2 steps to enter Has following equipment at home: Single point cane, Wheelchair (manual), shower chair, Grab bars, bed rail, and rubber mat.   PLOF: Independent   PATIENT GOALS: To be able to Drive, and cook again   OBJECTIVE:    HAND DOMINANCE: Right    ADLs:  Transfers/ambulation related to ADLs: Independent Eating: Independent Grooming: [employer] Pt. reports being unable to use her left hand to perform haircare.  UB Dressing: Independent pullover shirt, independent now with fastening a bra LB Dressing: Independent donning pants socks, and slide on shoes Toileting: Independent Bathing: Independent Tub Shower transfers: Walk-in shower Independent now    IADLs: Shopping: Needs to be accompanied to the grocery store. Light housekeeping: Do  Meal Prep:  Is able to perform light meal prep Community mobility: Relies on son, and friend Medication management: Son sets up Mining engineer management:TBD Handwriting: TBD   MOBILITY STATUS: Hx of falls out of bed     ACTIVITY TOLERANCE: Activity tolerance:  10-20 min. Before rest break   FUNCTIONAL OUTCOME MEASURES: FOTO: 61   TR score: 62  10/09/2022: FOTO: 49  (Recert) 06/02/23: 65   UPPER EXTREMITY ROM      Active ROM Right eval Left eval Left  10/09/2022 Left 10/23/22 Left 12/02/2022 Left 01/13/2023 Left  03/10/2023 Left 04/21/23 Left 05/12/23 Left 06/02/23 Left 06/16/23  Shoulder flexion WFL 54(74) scaption 0(55) 26(80) 32(82) 35(82) 35(85) 40(87) 70(96) 77 (104) 80(108)  Shoulder abduction WFL 58(75) 32(54) 32(50) 45(60) 47(70) 62(72) 48(76) 73(102) 70 (100) 76(106)  Shoulder adduction               Shoulder extension               Shoulder internal rotation            Thumb to L3 (T10) Thumb to L3 (T10)  Shoulder external rotation            (With elbow at side) 45 (60) (With elbow at side) 47 (60)  Elbow  flexion [facility] WFL 145 145 145 145 145 147  WFL WFL  Elbow extension [facility] WFL 10(0) 10(0) WFL WFL [facility]  [facility] WFL  Wrist flexion WFL TBD [facility]  WFL WFL  Wrist extension WFL TBD [facility] WFL [facility] WFL [facility]  WFL WFL  Wrist ulnar deviation               Wrist radial deviation               Wrist pronation             WFL  Wrist supination             WFL  (Blank rows = not tested)     UPPER EXTREMITY MMT:      MMT Right eval Left eval Left 10/09/2022 Left 10/23/2022 Left 12/02/2022 Left 01/13/2023 Left 03/10/2023 Left 04/21/23 Left 06/02/23 Left 06/16/23  Shoulder flexion 4+/5 2+/5 1/5 2-/5 2-/5 2-/5 2-/5 2-/5 2- 2+/5  Shoulder abduction 4+/5 2+/5 2-/5 2-/5 2-/5 2-/5 2-/5 2-/5 2- 2+/5  Shoulder adduction              Shoulder extension              Shoulder internal rotation           4- 4-/5  Shoulder external rotation           3- 3-/5  Middle trapezius              Lower trapezius              Elbow flexion 4+/5 3+/5 3/5 3/5 3+/5 3+/5 4/5 4/5 4+/5 4+/5  Elbow extension 4+/5 3+/5 3/5 3/5 3+/5 3+/5 4/5 4/5 4/5 4+/5  Wrist flexion 4+/5 TBD 3/5 3/5 3+/5 3+/5 4/5 4/5 4+/5 4+/5  Wrist extension 4+/5 TBD 3/5 3/5 3+/5 3+/5 4/5 4/5 4/5 4/5  Wrist ulnar deviation               Wrist radial deviation              Wrist pronation              Wrist supination              (Blank rows = not tested)   HAND FUNCTION: Grip strength: Right: 33 lbs; Left: 11 lbs, Lateral pinch: Right: 17 lbs, Left: 2 lbs, and 3 point pinch: Right: 17 lbs, Left: 2 lbs  10/09/2022: Grip strength: Right: 33 lbs; Left: 10 lbs, Lateral pinch: Right: 17 lbs, Left: 1 lbs, and 3 point pinch: Right: 17 lbs, Left: 1 lbs   10/23/2022: Grip strength: Right: 33 lbs; Left: 16 lbs, Lateral pinch: Right: 17 lbs, Left: 1 lbs, and 3 point pinch: Right: 17 lbs, Left: 1 lbs  12/02/2022: Grip strength: Right: 33 lbs; Left: 17 lbs, Lateral pinch: Right: 17 lbs, Left: 7 lbs, and 3 point pinch: Right: 17 lbs, Left: 3 lbs   01/13/2023: Grip strength: Right: 33 lbs; Left: 15 lbs, Lateral pinch: Right: 17 lbs, Left: 9 lbs, and 3 point pinch: Right: 17 lbs, Left: 7 lbs  03/10/2023: Grip strength: Right: 33 lbs; Left: 15 lbs, Lateral pinch: Right: 17 lbs, Left: 9 lbs, and 3 point pinch: Right: 17 lbs, Left: 7 lbs   04/21/2023: Grip strength: Right: 34 lbs; Left: 13 lbs, Lateral pinch: NT 3 point pinch: Right: NT  06/02/23: Grip strength: Right: 34 lbs; Left: 19 lbs, Lateral  pinch: Right: 16 lbs; Left: 6 lbs; 3 point pinch: Right: 13 lbs, Left: 7 lbs   06/16/2023: Grip strength: Right: 39 lbs; Left: 19 lbs, Lateral pinch: Right: 16 lbs; Left: 5 lbs; 3 point pinch: Right: 13 lbs, Left: 9 lbs      COORDINATION:   9 Hole Peg test: Right: 27 sec.  sec; Left: 52 sec.  10/09/2022: 9 Hole Peg test: Right: 27 sec.  sec; Left: 1 min. & 35 sec.  01/13/2023: 9 Hole Peg test: Right: 27 sec.  sec; Left: 51. Sec.  03/10/2023: 9 Hole Peg test: Right: 27 sec.  sec; Left: 54 Sec.  04/21/2023: 9 Hole Peg test: Right: 27 sec.  sec; Left: 46 Sec.  06/02/23: 9 hole Peg test: Left: 47 sec  06/15/23: 9 hole Peg test: Left: 45 sec  SENSATION: Light touch: WFL Proprioception: WFL   COGNITION: Overall cognitive status: Within  functional limits for tasks assessed   VISION: Subjective report: Glasses. Just received a new prescription to increase bifocal strength Baseline vision: Macular Degeneration Visual history: macular degeneration   VISION ASSESSMENT: To be further assessed in functional context  Left sided Awareness     PERCEPTION: Limited left sided awareness   TODAY'S TREATMENT:   Therapeutic Exercise:   Pt. performed AAROM/AROM to the left shoulder 2/2 weakness, limited ROM, and stiffness. Pt. worked on [employer] for at [employer]. Pt. worked on [employer], and reciprocal motion using the UBE while seated for 8 min. with no resistance. Constant monitoring was provided. Pt. worked on [employer] using the vertical wall finger ladder for 3 trial total. 2 trials to 18,  and one trial to 17.  Manual Therapy:  Pt. tolerated soft tissue massage to the left scapular, and shoulder 2/2 tightness and pain. Manual therapy was performed independent of, and in preparation for therapeutic Ex.    Self-care:   Pt. worked on Administrator, [employer] of the months of the year.    PATIENT EDUCATION: Education details: progress towards goals Person educated: Patient Education method: verbal cues Education comprehension: verbalized understanding    HOME EXERCISE PROGRAM:  Reviewed home activities to enhance left hand digit extension during typing skills.   GOALS: Goals reviewed with patient? Yes     SHORT TERM GOALS: Target date: 07/21/23   Pt. Will improve FOTO score by 2 points to reflect improved pt. perceived functional performance Baseline: 06/16/2023: FOTO score: 70 06/02/23: 65; 04/21/2023: 49 03/10/2023: 59 01/13/2023: 49 12/01/2022: FOTO: 51 10/23/2022: FOTO 49 10/09/2022: FOTO 49, FOTO: 61 TR score: 62 Goal status: achieved/ongoing   LONG TERM GOALS: Target date: 08/25/2023   Pt. Will improve left shoulder ROM by 10 degrees to be able able to efficiently apply deodorant Baseline:  06/16/23: shoulder flexion 80 (108), abd 76(106) Pt. Is able to apply deodorant to the left, however takes increased time.; 06/02/23: shoulder flexion 77 (104), abd 70 (100); 04/21/2023: shoulder flexion: 40(87), Abduction: 48(76) 03/10/2023: Shoulder flexion: 35(85) 01/13/2023:  Shoulder flexion: 35(82), Abduction: 47(70) 12/02/2022: Shoulder flexion: 32(82), Abduction: 45(60)  10/23/2022: Shoulder flexion: 26(80), Abduction: 32(50) 10/09/2022: flexion: 0(55), Abduction: 32(54) Goal status: Improving, ongoing   2.  Pt. will be able to independently hold and use a blow dryer, and brush for hair care. Baseline: 06/16/2023: Pt can hold a blow dryer but reaches only to L side of head and lower part of back of head with brush and blow dryer d/t limited ER ROM and strength; 06/02/23: Pt can hold a blow  dryer but reaches only to L side of head and lower part of back of head with brush and blow dryer d/t limited ER ROM and strength; 04/21/2023: Pt. is able to hold the hair dryer in the left hand, however is unable to reach up to dry it, and has difficulty using a brush with the left UE. 03/10/2023: Pt. continues to be able to hold the hair dryer in the left hand, however is unable to reach up to dry it, and has difficulty using a brush 01/13/2023: Pt. Can hold the hair dryer in the left hand, however is unable to reach up to dry it, and has difficulty using a brush. 12/02/2022: Pt. is unable to actively elevate he left shoulder in order to blow dry her hair. 10/23/2022: Pt. continues to be unable to hold a blow dryer. Eval: Pt. Is unable to sustain her BUEs in elevation, and use a blow dryer, and brush. 10/09/2022: Pt. Is unable to hold a blow dryer. Eval: Pt. Is unable to sustain her BUEs in elevation, and use a blow dryer, and brush. Goal status: improving/ongoing   3.  Pt. Will improve left lateral pinch strength by 3# to be able to independently cut meat. Baseline:06/16/2023: Lateral pinch: Left: 5 lbs. Pt. Has  difficulty cutting steak, however is able to cut tender pieces of chicken. 06/02/23: L lateral pinch: 6# (pt reports she lacks the strength to cut steak but can cut tender chicken); 04/21/2023: NT(Pinch Meter sent out for calibration) Pt. continues to have difficulty cutting meat. 03/10/2023: Left lateral pinch strength: 9#   01/13/2023: Left lateral pinch strength: 9#  12/02/2022: Left pinch: 7# Pt. has difficulty cutting meat. 10/23/2022: Pt. Continues to present with difficulty cutting meat. 10/09/2022: 1#  Eval: 2#. Pt. has difficulty stabilizing utensil, and food while cutting food. Goal status: Ongoing   4.  Pt. Will improve Left hand [facility] skills to be able to be able to independently, and efficiently manipulate small objects during ADL tasks. Baseline: 06/16/2023: Left 45 sec. 06/02/2023: L 47 sec; 04/21/2023:  46 sec. 03/10/2023: 54 sec. 01/13/2023: 51 sec. 12/02/2022: TBD  10/23/2022: Pt. Continues to present with difficulty manipulating small objects. 10/09/2022: Left: 1 min & 35 sec.: Eval: Left: 52 sec. Right: 27 sec. Goal status: Improving, Ongoing    6.: Pt. will improve with left grip strength to be able to hold and pull up pants Baseline:  06/16/2023: Left grip strength: 19# Pt. Is now able to pull her pants up.06/02/23: L grip 19#, improving; 04/21/2023: Pt. is able to pull up covers, however has difficulty pulling up pants. 03/10/2023: left  grip strength: 15# 01/13/2023: left  grip strength: 15# 12/02/2022: 17# 10/23/2022: Left: 16# 10/09/2022: Left: 10# Eval:  Left grip strength 11#. Pt. has difficulty holding a full drink  securely with the left hand.             Goal status: Improved   7. Pt. will improve typing speed, and accuracy in preparation for efficiently typing simple email message. Baseline: 06/16/2023:  Limited ability to carry-over practice at home due to Pt.'s mouse at home is not working. Pt. Also can not see the keys on her computer at home.  06/02/23: same as 04/21/23; 04/21/2023:   Pt. continues to present with limited speed, and accuracy with typing  03/10/2023: Pt. continues to present with limited speed, and accuracy with typing 01/13/2023: TBD 12/02/2022:  Pt. continues to present with limited speed, and accuracy with typing.10/23/2022: Pt. continues to present with  limited speed, and accuracy with typing. 10/09/2022: Pt. presents with difficulty typing. Pt. presents with difficulty typing an email. Typing speed, and accuracy TBD             Goal status: Deferred                                CLINICAL IMPRESSION:  Pt. reports that her son gave her a new computer monitor/screen, and that she can now use it because she can see it better. Pt. Was able to type the months of the year, however had multiple mistypes, and frequent multiple "A", and "E" repetitions from the left hand digits lagging on the letters. Pt. continues to benefit from OT services to work towards improving Left UE functioning in order to maximize engagement in, and overall independence with ADLs, and IADL tasks, while reducing risk of adhesive capsulitis.     PERFORMANCE DEFICITS in functional skills including ADLs, IADLs, coordination, dexterity, sensation, ROM, strength, FMC, decreased knowledge of use of DME, vision, and UE functional use, cognitive skills including attention, and psychosocial skills including coping strategies, environmental adaptation, habits, and routines and behaviors.    IMPAIRMENTS are limiting patient from ADLs, IADLs, and social participation.    COMORBIDITIES may have co-morbidities  that affects occupational performance. Patient will benefit from skilled OT to address above impairments and improve overall function.   MODIFICATION OR ASSISTANCE TO COMPLETE EVALUATION: Min-Moderate modification of tasks or assist with assess necessary to complete an evaluation.   OT OCCUPATIONAL PROFILE AND HISTORY: Detailed assessment: Review of records and additional review of physical, cognitive,  psychosocial history related to current functional performance.   CLINICAL DECISION MAKING: Moderate - several treatment options, min-mod task modification necessary   REHAB POTENTIAL: Good   EVALUATION COMPLEXITY: Moderate      PLAN: OT FREQUENCY: 2x/week   OT DURATION: 12 weeks   PLANNED INTERVENTIONS: self care/ADL training, therapeutic exercise, therapeutic activity, neuromuscular re-education, manual therapy, passive range of motion, and paraffin   RECOMMENDED OTHER SERVICES:    CONSULTED AND AGREED WITH PLAN OF CARE: Patient   PLAN FOR NEXT SESSION:  Left hand strengthening and coordination exercises  Olegario Messier, MS, OTR/L    07/02/23, 12:28 PM

## 2023-07-07 ENCOUNTER — Ambulatory Visit: Payer: Medicare HMO | Admitting: Occupational Therapy

## 2023-07-07 DIAGNOSIS — M6281 Muscle weakness (generalized): Secondary | ICD-10-CM | POA: Diagnosis not present

## 2023-07-07 DIAGNOSIS — R278 Other lack of coordination: Secondary | ICD-10-CM

## 2023-07-08 NOTE — Therapy (Signed)
Occupational Therapy Treatment Note   Patient Name: Alyssa Mayo MRN: [id number] DOB:Jan 10, 1946, 77 y.o., female Today's Date: 03/14/2022   PCP: [name], MD REFERRING PROVIDER: [name], NP     OT End of Session - 07/08/23 0835     Visit Number 45    Date for OT Re-Evaluation 08/25/23    Authorization Type Progress reporting period starting 06/16/2023    OT Start Time 1453    OT Stop Time 1530    OT Time Calculation (min) 37 min    Activity Tolerance Patient tolerated treatment well    Behavior During Therapy [facility] for tasks assessed/performed                     Past Medical History:  Diagnosis Date   Abnormal levels of other serum enzymes 07/17/2015   Arthritis     Constipation 07/11/2015   COVID-19 03/06/2021   Diabetes mellitus without complication (HCC)     Elevated liver enzymes     Fatty liver     Generalized abdominal pain 07/11/2015   Hyperlipidemia     Hypertension     Lifelong obesity     Macular degeneration     Morbid obesity due to excess calories (HCC) 07/11/2015   Other fatigue 07/11/2015   Sleep apnea     Spinal headache           Past Surgical History:  Procedure Laterality Date   ABDOMINAL HYSTERECTOMY       APPENDECTOMY       CATARACT EXTRACTION       CERVICAL LAMINECTOMY   07/21/1985   CESAREAN SECTION       COLONOSCOPY   07/21/2012    diverticulosis, [facility] Dr. [name]    COLONOSCOPY WITH PROPOFOL N/A 02/04/2019    Procedure: COLONOSCOPY WITH PROPOFOL;  Surgeon: [name], MD;  Location: [facility] ENDOSCOPY;  Service: Endoscopy;  Laterality: N/A;   COLONOSCOPY WITH PROPOFOL N/A 03/18/2021    Procedure: COLONOSCOPY WITH PROPOFOL;  Surgeon: [name], MD;  Location: [facility] ENDOSCOPY;  Service: Endoscopy;  Laterality: N/A;  DM   DIAGNOSTIC LAPAROSCOPY       ESOPHAGOGASTRODUODENOSCOPY (EGD) WITH PROPOFOL N/A 02/04/2019    Procedure: ESOPHAGOGASTRODUODENOSCOPY (EGD) WITH PROPOFOL;  Surgeon: [name], MD;   Location: [facility] ENDOSCOPY;  Service: Endoscopy;  Laterality: N/A;   ESOPHAGOGASTRODUODENOSCOPY (EGD) WITH PROPOFOL N/A 03/18/2021    Procedure: ESOPHAGOGASTRODUODENOSCOPY (EGD) WITH PROPOFOL;  Surgeon: [name], MD;  Location: [facility] ENDOSCOPY;  Service: Endoscopy;  Laterality: N/A;   LOOP RECORDER INSERTION N/A 08/05/2021    Procedure: LOOP RECORDER INSERTION;  Surgeon: [name], MD;  Location: [facility] INVASIVE CV LAB;  Service: Cardiovascular;  Laterality: N/A;   SPINE SURGERY       TUBAL LIGATION            Patient Active Problem List    Diagnosis Date Noted   Paroxysmal atrial fibrillation (HCC) 08/08/2022   Hypercoagulable state due to paroxysmal atrial fibrillation (HCC) 08/08/2022   Cerebral edema (HCC) 08/07/2021   OSA (obstructive sleep apnea) 08/07/2021   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   CVA (cerebral vascular accident) (HCC) 08/01/2021   GERD (gastroesophageal reflux disease) 07/20/2019   Advanced care planning/counseling discussion 12/17/2016   Skin lesions, generalized 11/13/2016   Chronic right hip pain 06/17/2016   Vitamin D deficiency 09/26/2015   Diastasis recti 08/08/2015   Elevated serum GGT level 07/24/2015   Essential hypertension 07/17/2015  Fatty liver 07/17/2015   Elevated alkaline phosphatase level 07/17/2015   Elevated serum glutamic pyruvic transaminase (SGPT) level 07/17/2015   Abnormal finding on EKG 07/11/2015   Morbid obesity (HCC) 07/11/2015   Colon, diverticulosis 07/11/2015   Abdominal wall hernia 07/11/2015   DM (diabetes mellitus), type 2 with neurological complications (HCC)     Hyperlipidemia      ONSET DATE: 08/01/2021   REFERRING DIAG: CVA   THERAPY DIAG:  Muscle weakness (generalized)   Rationale for Evaluation and Treatment Rehabilitation   SUBJECTIVE:   SUBJECTIVE STATEMENT:       Pt. reports doing well today  Pt accompanied by: self   PERTINENT HISTORY:  Pt. is a 77 y.o. female who had a  unexpectedly hospitalized from 2/12-2/14/2024 for COVID-19, and a UTI.  Pt. 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 Pt. attended inpatient rehab from 08/07/2021-08/22/2021. Pt. received Home health therapy services this past spring.  Pt. has recently had an assessment through driver rehabilitation services at [facility] with recommendations for referrals for  outpatient OT/PT, and ST services. Pt. PMHx includes: HTN, Hyperlipidemia, Macular degeneration, DM, and Obesity.   PRECAUTIONS: None   WEIGHT BEARING RESTRICTIONS No   PAIN: Are you having pain? <1/10 left shoulder  FALLS: Has patient fallen in last 6 months? Yes.   LIVING ENVIRONMENT: Lives with: lives with their family  Son  [name] Lives in: House/apartment Main living are on one floor Stairs:  2 steps to enter Has following equipment at home: Single point cane, Wheelchair (manual), shower chair, Grab bars, bed rail, and rubber mat.   PLOF: Independent   PATIENT GOALS: To be able to Drive, and cook again   OBJECTIVE:    HAND DOMINANCE: Right    ADLs:  Transfers/ambulation related to ADLs: Independent Eating: Independent Grooming: [employer] Pt. reports being unable to use her left hand to perform haircare.  UB Dressing: Independent pullover shirt, independent now with fastening a bra LB Dressing: Independent donning pants socks, and slide on shoes Toileting: Independent Bathing: Independent Tub Shower transfers: Walk-in shower Independent now    IADLs: Shopping: Needs to be accompanied to the grocery store. Light housekeeping: Do  Meal Prep:  Is able to perform light meal prep Community mobility: Relies on son, and friend Medication management: Son sets up Mining engineer management:TBD Handwriting: TBD   MOBILITY STATUS: Hx of falls out of bed     ACTIVITY TOLERANCE: Activity tolerance:  10-20 min. Before rest break   FUNCTIONAL OUTCOME MEASURES: FOTO: 61   TR score: 62  10/09/2022: FOTO: 49  (Recert) 06/02/23: 65   UPPER EXTREMITY ROM      Active ROM Right eval Left eval Left  10/09/2022 Left 10/23/22 Left 12/02/2022 Left 01/13/2023 Left  03/10/2023 Left 04/21/23 Left 05/12/23 Left 06/02/23 Left 06/16/23  Shoulder flexion WFL 54(74) scaption 0(55) 26(80) 32(82) 35(82) 35(85) 40(87) 70(96) 77 (104) 80(108)  Shoulder abduction WFL 58(75) 32(54) 32(50) 45(60) 47(70) 62(72) 48(76) 73(102) 70 (100) 76(106)  Shoulder adduction               Shoulder extension               Shoulder internal rotation            Thumb to L3 (T10) Thumb to L3 (T10)  Shoulder external rotation            (With elbow at side) 45 (60) (With elbow at side) 47 (60)  Elbow  flexion [facility] WFL 145 145 145 145 145 147  WFL WFL  Elbow extension [facility] WFL 10(0) 10(0) WFL WFL [facility]  [facility] WFL  Wrist flexion WFL TBD [facility]  WFL WFL  Wrist extension WFL TBD [facility] WFL [facility] WFL [facility]  WFL WFL  Wrist ulnar deviation               Wrist radial deviation               Wrist pronation             WFL  Wrist supination             WFL  (Blank rows = not tested)     UPPER EXTREMITY MMT:      MMT Right eval Left eval Left 10/09/2022 Left 10/23/2022 Left 12/02/2022 Left 01/13/2023 Left 03/10/2023 Left 04/21/23 Left 06/02/23 Left 06/16/23  Shoulder flexion 4+/5 2+/5 1/5 2-/5 2-/5 2-/5 2-/5 2-/5 2- 2+/5  Shoulder abduction 4+/5 2+/5 2-/5 2-/5 2-/5 2-/5 2-/5 2-/5 2- 2+/5  Shoulder adduction              Shoulder extension              Shoulder internal rotation           4- 4-/5  Shoulder external rotation           3- 3-/5  Middle trapezius              Lower trapezius              Elbow flexion 4+/5 3+/5 3/5 3/5 3+/5 3+/5 4/5 4/5 4+/5 4+/5  Elbow extension 4+/5 3+/5 3/5 3/5 3+/5 3+/5 4/5 4/5 4/5 4+/5  Wrist flexion 4+/5 TBD 3/5 3/5 3+/5 3+/5 4/5 4/5 4+/5 4+/5  Wrist extension 4+/5 TBD 3/5 3/5 3+/5 3+/5 4/5 4/5 4/5 4/5  Wrist ulnar deviation               Wrist radial deviation              Wrist pronation              Wrist supination              (Blank rows = not tested)   HAND FUNCTION: Grip strength: Right: 33 lbs; Left: 11 lbs, Lateral pinch: Right: 17 lbs, Left: 2 lbs, and 3 point pinch: Right: 17 lbs, Left: 2 lbs  10/09/2022: Grip strength: Right: 33 lbs; Left: 10 lbs, Lateral pinch: Right: 17 lbs, Left: 1 lbs, and 3 point pinch: Right: 17 lbs, Left: 1 lbs   10/23/2022: Grip strength: Right: 33 lbs; Left: 16 lbs, Lateral pinch: Right: 17 lbs, Left: 1 lbs, and 3 point pinch: Right: 17 lbs, Left: 1 lbs  12/02/2022: Grip strength: Right: 33 lbs; Left: 17 lbs, Lateral pinch: Right: 17 lbs, Left: 7 lbs, and 3 point pinch: Right: 17 lbs, Left: 3 lbs   01/13/2023: Grip strength: Right: 33 lbs; Left: 15 lbs, Lateral pinch: Right: 17 lbs, Left: 9 lbs, and 3 point pinch: Right: 17 lbs, Left: 7 lbs  03/10/2023: Grip strength: Right: 33 lbs; Left: 15 lbs, Lateral pinch: Right: 17 lbs, Left: 9 lbs, and 3 point pinch: Right: 17 lbs, Left: 7 lbs   04/21/2023: Grip strength: Right: 34 lbs; Left: 13 lbs, Lateral pinch: NT 3 point pinch: Right: NT  06/02/23: Grip strength: Right: 34 lbs; Left: 19 lbs, Lateral  pinch: Right: 16 lbs; Left: 6 lbs; 3 point pinch: Right: 13 lbs, Left: 7 lbs   06/16/2023: Grip strength: Right: 39 lbs; Left: 19 lbs, Lateral pinch: Right: 16 lbs; Left: 5 lbs; 3 point pinch: Right: 13 lbs, Left: 9 lbs      COORDINATION:   9 Hole Peg test: Right: 27 sec.  sec; Left: 52 sec.  10/09/2022: 9 Hole Peg test: Right: 27 sec.  sec; Left: 1 min. & 35 sec.  01/13/2023: 9 Hole Peg test: Right: 27 sec.  sec; Left: 51. Sec.  03/10/2023: 9 Hole Peg test: Right: 27 sec.  sec; Left: 54 Sec.  04/21/2023: 9 Hole Peg test: Right: 27 sec.  sec; Left: 46 Sec.  06/02/23: 9 hole Peg test: Left: 47 sec  06/15/23: 9 hole Peg test: Left: 45 sec  SENSATION: Light touch: WFL Proprioception: WFL   COGNITION: Overall cognitive status: Within  functional limits for tasks assessed   VISION: Subjective report: Glasses. Just received a new prescription to increase bifocal strength Baseline vision: Macular Degeneration Visual history: macular degeneration   VISION ASSESSMENT: To be further assessed in functional context  Left sided Awareness     PERCEPTION: Limited left sided awareness   TODAY'S TREATMENT:   Therapeutic Exercise:   Pt. performed AAROM/AROM to the left shoulder 2/2 weakness, limited ROM, and stiffness. Pt. worked on [employer] for at [employer]. Pt. worked on [employer], and reciprocal motion using the UBE while seated for 8 min. with no resistance. Constant monitoring was provided. Pt. worked on [employer] using the vertical wall finger ladder for 3 trial total. 2 trials to 17,  and one trial to 15.  Manual Therapy:  Pt. tolerated soft tissue massage to the left scapular, and shoulder 2/2 tightness and pain. Manual therapy was performed independent of, and in preparation for therapeutic Ex.    Neuromuscular re-education:  Pt. worked on using her left hand for grasping, and manipulating 1/2" washers from a magnetic dish using point grasp pattern. Pt. worked on reaching up, stabilizing, and sustaining shoulder elevation while placing the washer over a small precise target on vertical dowels positioned at various angles.     PATIENT EDUCATION: Education details: progress towards goals Person educated: Patient Education method: verbal cues Education comprehension: verbalized understanding    HOME EXERCISE PROGRAM:  Reviewed home activities to enhance left hand digit extension during typing skills.   GOALS: Goals reviewed with patient? Yes     SHORT TERM GOALS: Target date: 07/21/23   Pt. Will improve FOTO score by 2 points to reflect improved pt. perceived functional performance Baseline: 06/16/2023: FOTO score: 70 06/02/23: 65; 04/21/2023: 49 03/10/2023: 59 01/13/2023: 49 12/01/2022: FOTO: 51  10/23/2022: FOTO 49 10/09/2022: FOTO 49, FOTO: 61 TR score: 62 Goal status: achieved/ongoing   LONG TERM GOALS: Target date: 08/25/2023   Pt. Will improve left shoulder ROM by 10 degrees to be able able to efficiently apply deodorant Baseline: 06/16/23: shoulder flexion 80 (108), abd 76(106) Pt. Is able to apply deodorant to the left, however takes increased time.; 06/02/23: shoulder flexion 77 (104), abd 70 (100); 04/21/2023: shoulder flexion: 40(87), Abduction: 48(76) 03/10/2023: Shoulder flexion: 35(85) 01/13/2023:  Shoulder flexion: 35(82), Abduction: 47(70) 12/02/2022: Shoulder flexion: 32(82), Abduction: 45(60)  10/23/2022: Shoulder flexion: 26(80), Abduction: 32(50) 10/09/2022: flexion: 0(55), Abduction: 32(54) Goal status: Improving, ongoing   2.  Pt. will be able to independently hold and use a blow dryer, and brush for hair care. Baseline: 06/16/2023: Pt can hold  a blow dryer but reaches only to L side of head and lower part of back of head with brush and blow dryer d/t limited ER ROM and strength; 06/02/23: Pt can hold a blow dryer but reaches only to L side of head and lower part of back of head with brush and blow dryer d/t limited ER ROM and strength; 04/21/2023: Pt. is able to hold the hair dryer in the left hand, however is unable to reach up to dry it, and has difficulty using a brush with the left UE. 03/10/2023: Pt. continues to be able to hold the hair dryer in the left hand, however is unable to reach up to dry it, and has difficulty using a brush 01/13/2023: Pt. Can hold the hair dryer in the left hand, however is unable to reach up to dry it, and has difficulty using a brush. 12/02/2022: Pt. is unable to actively elevate he left shoulder in order to blow dry her hair. 10/23/2022: Pt. continues to be unable to hold a blow dryer. Eval: Pt. Is unable to sustain her BUEs in elevation, and use a blow dryer, and brush. 10/09/2022: Pt. Is unable to hold a blow dryer. Eval: Pt. Is unable to sustain her  BUEs in elevation, and use a blow dryer, and brush. Goal status: improving/ongoing   3.  Pt. Will improve left lateral pinch strength by 3# to be able to independently cut meat. Baseline:06/16/2023: Lateral pinch: Left: 5 lbs. Pt. Has difficulty cutting steak, however is able to cut tender pieces of chicken. 06/02/23: L lateral pinch: 6# (pt reports she lacks the strength to cut steak but can cut tender chicken); 04/21/2023: NT(Pinch Meter sent out for calibration) Pt. continues to have difficulty cutting meat. 03/10/2023: Left lateral pinch strength: 9#   01/13/2023: Left lateral pinch strength: 9#  12/02/2022: Left pinch: 7# Pt. has difficulty cutting meat. 10/23/2022: Pt. Continues to present with difficulty cutting meat. 10/09/2022: 1#  Eval: 2#. Pt. has difficulty stabilizing utensil, and food while cutting food. Goal status: Ongoing   4.  Pt. Will improve Left hand [facility] skills to be able to be able to independently, and efficiently manipulate small objects during ADL tasks. Baseline: 06/16/2023: Left 45 sec. 06/02/2023: L 47 sec; 04/21/2023:  46 sec. 03/10/2023: 54 sec. 01/13/2023: 51 sec. 12/02/2022: TBD  10/23/2022: Pt. Continues to present with difficulty manipulating small objects. 10/09/2022: Left: 1 min & 35 sec.: Eval: Left: 52 sec. Right: 27 sec. Goal status: Improving, Ongoing    6.: Pt. will improve with left grip strength to be able to hold and pull up pants Baseline:  06/16/2023: Left grip strength: 19# Pt. Is now able to pull her pants up.06/02/23: L grip 19#, improving; 04/21/2023: Pt. is able to pull up covers, however has difficulty pulling up pants. 03/10/2023: left  grip strength: 15# 01/13/2023: left  grip strength: 15# 12/02/2022: 17# 10/23/2022: Left: 16# 10/09/2022: Left: 10# Eval:  Left grip strength 11#. Pt. has difficulty holding a full drink  securely with the left hand.             Goal status: Improved   7. Pt. will improve typing speed, and accuracy in preparation for efficiently  typing simple email message. Baseline: 06/16/2023:  Limited ability to carry-over practice at home due to Pt.'s mouse at home is not working. Pt. Also can not see the keys on her computer at home.  06/02/23: same as 04/21/23; 04/21/2023:  Pt. continues to present with limited speed, and accuracy  with typing  03/10/2023: Pt. continues to present with limited speed, and accuracy with typing 01/13/2023: TBD 12/02/2022:  Pt. continues to present with limited speed, and accuracy with typing.10/23/2022: Pt. continues to present with limited speed, and accuracy with typing. 10/09/2022: Pt. presents with difficulty typing. Pt. presents with difficulty typing an email. Typing speed, and accuracy TBD             Goal status: Deferred                                CLINICAL IMPRESSION:  Pt.'s appointment time was changed from this morning to this afternoon 2/2 to a scheduling conflict today. Pt. was late for the session this afternoon. Pt. was able to complete 2 reps  to 17 steps on the finger ladder with 1 rep to 15. Pt. required cues to avoid lifting heels off the floor with reach.  Pt. Required assist, and support proximally at the elbow when reaching to place the washers onto the vertical, and horizontal dowels. Pt. continues to work on improving, and maximizing independence with ADLs, and IADLs. Pt. continues to benefit from OT services to work towards improving Left UE functioning in order to maximize engagement in, and overall independence with ADLs, and IADL tasks, while reducing risk of adhesive capsulitis.     PERFORMANCE DEFICITS in functional skills including ADLs, IADLs, coordination, dexterity, sensation, ROM, strength, FMC, decreased knowledge of use of DME, vision, and UE functional use, cognitive skills including attention, and psychosocial skills including coping strategies, environmental adaptation, habits, and routines and behaviors.    IMPAIRMENTS are limiting patient from ADLs, IADLs, and social  participation.    COMORBIDITIES may have co-morbidities  that affects occupational performance. Patient will benefit from skilled OT to address above impairments and improve overall function.   MODIFICATION OR ASSISTANCE TO COMPLETE EVALUATION: Min-Moderate modification of tasks or assist with assess necessary to complete an evaluation.   OT OCCUPATIONAL PROFILE AND HISTORY: Detailed assessment: Review of records and additional review of physical, cognitive, psychosocial history related to current functional performance.   CLINICAL DECISION MAKING: Moderate - several treatment options, min-mod task modification necessary   REHAB POTENTIAL: Good   EVALUATION COMPLEXITY: Moderate      PLAN: OT FREQUENCY: 2x/week   OT DURATION: 12 weeks   PLANNED INTERVENTIONS: self care/ADL training, therapeutic exercise, therapeutic activity, neuromuscular re-education, manual therapy, passive range of motion, and paraffin   RECOMMENDED OTHER SERVICES:    CONSULTED AND AGREED WITH PLAN OF CARE: Patient   PLAN FOR NEXT SESSION:  Left hand strengthening and coordination exercises  Olegario Messier, MS, OTR/L    07/08/23, 8:38 AM

## 2023-07-09 ENCOUNTER — Ambulatory Visit: Payer: Medicare HMO | Admitting: Occupational Therapy

## 2023-07-09 DIAGNOSIS — M6281 Muscle weakness (generalized): Secondary | ICD-10-CM | POA: Diagnosis not present

## 2023-07-09 NOTE — Therapy (Signed)
Occupational Therapy Treatment Note   Patient Name: Alyssa Mayo MRN: [id number] DOB:Jul 26, 1945, 77 y.o., female Today's Date: 03/14/2022   PCP: [name], MD REFERRING PROVIDER: [name], NP     OT End of Session - 07/09/23 1051     Visit Number 46    Number of Visits 81    Date for OT Re-Evaluation 08/25/23    OT Start Time 1015    OT Stop Time 1100    OT Time Calculation (min) 45 min    Activity Tolerance Patient tolerated treatment well    Behavior During Therapy [facility] for tasks assessed/performed                     Past Medical History:  Diagnosis Date   Abnormal levels of other serum enzymes 07/17/2015   Arthritis     Constipation 07/11/2015   COVID-19 03/06/2021   Diabetes mellitus without complication (HCC)     Elevated liver enzymes     Fatty liver     Generalized abdominal pain 07/11/2015   Hyperlipidemia     Hypertension     Lifelong obesity     Macular degeneration     Morbid obesity due to excess calories (HCC) 07/11/2015   Other fatigue 07/11/2015   Sleep apnea     Spinal headache           Past Surgical History:  Procedure Laterality Date   ABDOMINAL HYSTERECTOMY       APPENDECTOMY       CATARACT EXTRACTION       CERVICAL LAMINECTOMY   07/21/1985   CESAREAN SECTION       COLONOSCOPY   07/21/2012    diverticulosis, [facility] Dr. [name]    COLONOSCOPY WITH PROPOFOL N/A 02/04/2019    Procedure: COLONOSCOPY WITH PROPOFOL;  Surgeon: [name], MD;  Location: [facility] ENDOSCOPY;  Service: Endoscopy;  Laterality: N/A;   COLONOSCOPY WITH PROPOFOL N/A 03/18/2021    Procedure: COLONOSCOPY WITH PROPOFOL;  Surgeon: [name], MD;  Location: [facility] ENDOSCOPY;  Service: Endoscopy;  Laterality: N/A;  DM   DIAGNOSTIC LAPAROSCOPY       ESOPHAGOGASTRODUODENOSCOPY (EGD) WITH PROPOFOL N/A 02/04/2019    Procedure: ESOPHAGOGASTRODUODENOSCOPY (EGD) WITH PROPOFOL;  Surgeon: [name], MD;  Location: [facility] ENDOSCOPY;  Service:  Endoscopy;  Laterality: N/A;   ESOPHAGOGASTRODUODENOSCOPY (EGD) WITH PROPOFOL N/A 03/18/2021    Procedure: ESOPHAGOGASTRODUODENOSCOPY (EGD) WITH PROPOFOL;  Surgeon: [name], MD;  Location: [facility] ENDOSCOPY;  Service: Endoscopy;  Laterality: N/A;   LOOP RECORDER INSERTION N/A 08/05/2021    Procedure: LOOP RECORDER INSERTION;  Surgeon: [name], MD;  Location: [facility] INVASIVE CV LAB;  Service: Cardiovascular;  Laterality: N/A;   SPINE SURGERY       TUBAL LIGATION            Patient Active Problem List    Diagnosis Date Noted   Paroxysmal atrial fibrillation (HCC) 08/08/2022   Hypercoagulable state due to paroxysmal atrial fibrillation (HCC) 08/08/2022   Cerebral edema (HCC) 08/07/2021   OSA (obstructive sleep apnea) 08/07/2021   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   CVA (cerebral vascular accident) (HCC) 08/01/2021   GERD (gastroesophageal reflux disease) 07/20/2019   Advanced care planning/counseling discussion 12/17/2016   Skin lesions, generalized 11/13/2016   Chronic right hip pain 06/17/2016   Vitamin D deficiency 09/26/2015   Diastasis recti 08/08/2015   Elevated serum GGT level 07/24/2015   Essential hypertension 07/17/2015   Fatty liver  07/17/2015   Elevated alkaline phosphatase level 07/17/2015   Elevated serum glutamic pyruvic transaminase (SGPT) level 07/17/2015   Abnormal finding on EKG 07/11/2015   Morbid obesity (HCC) 07/11/2015   Colon, diverticulosis 07/11/2015   Abdominal wall hernia 07/11/2015   DM (diabetes mellitus), type 2 with neurological complications (HCC)     Hyperlipidemia      ONSET DATE: 08/01/2021   REFERRING DIAG: CVA   THERAPY DIAG:  Muscle weakness (generalized)   Rationale for Evaluation and Treatment Rehabilitation   SUBJECTIVE:   SUBJECTIVE STATEMENT:       Pt. reports doing well today  Pt accompanied by: self   PERTINENT HISTORY:  Pt. is a 77 y.o. female who had a unexpectedly hospitalized from  2/12-2/14/2024 for COVID-19, and a UTI.  Pt. 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 Pt. attended inpatient rehab from 08/07/2021-08/22/2021. Pt. received Home health therapy services this past spring.  Pt. has recently had an assessment through driver rehabilitation services at [facility] with recommendations for referrals for  outpatient OT/PT, and ST services. Pt. PMHx includes: HTN, Hyperlipidemia, Macular degeneration, DM, and Obesity.   PRECAUTIONS: None   WEIGHT BEARING RESTRICTIONS No   PAIN: Are you having pain? No reports of pain  FALLS: Has patient fallen in last 6 months? Yes.   LIVING ENVIRONMENT: Lives with: lives with their family  Son  [name] Lives in: House/apartment Main living are on one floor Stairs:  2 steps to enter Has following equipment at home: Single point cane, Wheelchair (manual), shower chair, Grab bars, bed rail, and rubber mat.   PLOF: Independent   PATIENT GOALS: To be able to Drive, and cook again   OBJECTIVE:    HAND DOMINANCE: Right    ADLs:  Transfers/ambulation related to ADLs: Independent Eating: Independent Grooming: [employer] Pt. reports being unable to use her left hand to perform haircare.  UB Dressing: Independent pullover shirt, independent now with fastening a bra LB Dressing: Independent donning pants socks, and slide on shoes Toileting: Independent Bathing: Independent Tub Shower transfers: Walk-in shower Independent now    IADLs: Shopping: Needs to be accompanied to the grocery store. Light housekeeping: Do  Meal Prep:  Is able to perform light meal prep Community mobility: Relies on son, and friend Medication management: Son sets up Mining engineer management:TBD Handwriting: TBD   MOBILITY STATUS: Hx of falls out of bed     ACTIVITY TOLERANCE: Activity tolerance:  10-20 min. Before rest break   FUNCTIONAL OUTCOME MEASURES: FOTO: 61  TR score: 62  10/09/2022: FOTO:  49  (Recert) 06/02/23: 65   UPPER EXTREMITY ROM      Active ROM Right eval Left eval Left  10/09/2022 Left 10/23/22 Left 12/02/2022 Left 01/13/2023 Left  03/10/2023 Left 04/21/23 Left 05/12/23 Left 06/02/23 Left 06/16/23  Shoulder flexion WFL 54(74) scaption 0(55) 26(80) 32(82) 35(82) 35(85) 40(87) 70(96) 77 (104) 80(108)  Shoulder abduction WFL 58(75) 32(54) 32(50) 45(60) 47(70) 62(72) 48(76) 73(102) 70 (100) 76(106)  Shoulder adduction               Shoulder extension               Shoulder internal rotation            Thumb to L3 (T10) Thumb to L3 (T10)  Shoulder external rotation            (With elbow at side) 45 (60) (With elbow at side) 47 (60)  Elbow flexion  [facility] WFL 145 145 145 145 145 147  WFL WFL  Elbow extension [facility] WFL 10(0) 10(0) WFL WFL [facility]  [facility] WFL  Wrist flexion WFL TBD [facility]  WFL WFL  Wrist extension WFL TBD [facility] WFL [facility] WFL [facility]  WFL WFL  Wrist ulnar deviation               Wrist radial deviation               Wrist pronation             WFL  Wrist supination             WFL  (Blank rows = not tested)     UPPER EXTREMITY MMT:      MMT Right eval Left eval Left 10/09/2022 Left 10/23/2022 Left 12/02/2022 Left 01/13/2023 Left 03/10/2023 Left 04/21/23 Left 06/02/23 Left 06/16/23  Shoulder flexion 4+/5 2+/5 1/5 2-/5 2-/5 2-/5 2-/5 2-/5 2- 2+/5  Shoulder abduction 4+/5 2+/5 2-/5 2-/5 2-/5 2-/5 2-/5 2-/5 2- 2+/5  Shoulder adduction              Shoulder extension              Shoulder internal rotation           4- 4-/5  Shoulder external rotation           3- 3-/5  Middle trapezius              Lower trapezius              Elbow flexion 4+/5 3+/5 3/5 3/5 3+/5 3+/5 4/5 4/5 4+/5 4+/5  Elbow extension 4+/5 3+/5 3/5 3/5 3+/5 3+/5 4/5 4/5 4/5 4+/5  Wrist flexion 4+/5 TBD 3/5 3/5 3+/5 3+/5 4/5 4/5 4+/5 4+/5  Wrist extension 4+/5 TBD 3/5 3/5 3+/5 3+/5 4/5 4/5 4/5 4/5  Wrist ulnar deviation              Wrist radial deviation               Wrist pronation              Wrist supination              (Blank rows = not tested)   HAND FUNCTION: Grip strength: Right: 33 lbs; Left: 11 lbs, Lateral pinch: Right: 17 lbs, Left: 2 lbs, and 3 point pinch: Right: 17 lbs, Left: 2 lbs  10/09/2022: Grip strength: Right: 33 lbs; Left: 10 lbs, Lateral pinch: Right: 17 lbs, Left: 1 lbs, and 3 point pinch: Right: 17 lbs, Left: 1 lbs   10/23/2022: Grip strength: Right: 33 lbs; Left: 16 lbs, Lateral pinch: Right: 17 lbs, Left: 1 lbs, and 3 point pinch: Right: 17 lbs, Left: 1 lbs  12/02/2022: Grip strength: Right: 33 lbs; Left: 17 lbs, Lateral pinch: Right: 17 lbs, Left: 7 lbs, and 3 point pinch: Right: 17 lbs, Left: 3 lbs   01/13/2023: Grip strength: Right: 33 lbs; Left: 15 lbs, Lateral pinch: Right: 17 lbs, Left: 9 lbs, and 3 point pinch: Right: 17 lbs, Left: 7 lbs  03/10/2023: Grip strength: Right: 33 lbs; Left: 15 lbs, Lateral pinch: Right: 17 lbs, Left: 9 lbs, and 3 point pinch: Right: 17 lbs, Left: 7 lbs   04/21/2023: Grip strength: Right: 34 lbs; Left: 13 lbs, Lateral pinch: NT 3 point pinch: Right: NT  06/02/23: Grip strength: Right: 34 lbs; Left: 19 lbs, Lateral pinch:  Right: 16 lbs; Left: 6 lbs; 3 point pinch: Right: 13 lbs, Left: 7 lbs   06/16/2023: Grip strength: Right: 39 lbs; Left: 19 lbs, Lateral pinch: Right: 16 lbs; Left: 5 lbs; 3 point pinch: Right: 13 lbs, Left: 9 lbs      COORDINATION:   9 Hole Peg test: Right: 27 sec.  sec; Left: 52 sec.  10/09/2022: 9 Hole Peg test: Right: 27 sec.  sec; Left: 1 min. & 35 sec.  01/13/2023: 9 Hole Peg test: Right: 27 sec.  sec; Left: 51. Sec.  03/10/2023: 9 Hole Peg test: Right: 27 sec.  sec; Left: 54 Sec.  04/21/2023: 9 Hole Peg test: Right: 27 sec.  sec; Left: 46 Sec.  06/02/23: 9 hole Peg test: Left: 47 sec  06/15/23: 9 hole Peg test: Left: 45 sec  SENSATION: Light touch: WFL Proprioception: WFL   COGNITION: Overall cognitive status: Within functional limits for tasks assessed    VISION: Subjective report: Glasses. Just received a new prescription to increase bifocal strength Baseline vision: Macular Degeneration Visual history: macular degeneration   VISION ASSESSMENT: To be further assessed in functional context  Left sided Awareness     PERCEPTION: Limited left sided awareness   TODAY'S TREATMENT:   Therapeutic Exercise:   Pt. performed AAROM/AROM to the left shoulder 2/2 weakness, limited ROM, and stiffness. Pt. worked on [employer] for at [employer]. Pt. worked on [employer], and reciprocal motion using the [employer] while seated for 8 min. At level 2. Constant monitoring was provided. Pt. worked on [employer] using the vertical wall finger ladder for 3 trial total. 3 trials of 15 reps. Pt. performed 2# dowel ex. for UE strengthening secondary to weakness. Bilateral shoulder flexion, chest press, and circular patterns were performed.  Neuromuscular re-education:  Pt. worked on using her left hand for grasping, and manipulating 1/2" washers from a magnetic dish using point grasp pattern. Pt. worked on reaching up, stabilizing, and sustaining shoulder elevation while placing the washer over a small precise target on vertical dowels positioned at various angles.     PATIENT EDUCATION: Education details: UE strengthening, FMC, functional reach Person educated: Patient Education method: verbal cues Education comprehension: verbalized understanding    HOME EXERCISE PROGRAM:  Reviewed home activities to enhance left hand digit extension during typing skills.   GOALS: Goals reviewed with patient? Yes     SHORT TERM GOALS: Target date: 07/21/23   Pt. Will improve FOTO score by 2 points to reflect improved pt. perceived functional performance Baseline: 06/16/2023: FOTO score: 70 06/02/23: 65; 04/21/2023: 49 03/10/2023: 59 01/13/2023: 49 12/01/2022: FOTO: 51 10/23/2022: FOTO 49 10/09/2022: FOTO 49, FOTO: 61 TR score: 62 Goal status: achieved/ongoing    LONG TERM GOALS: Target date: 08/25/2023   Pt. Will improve left shoulder ROM by 10 degrees to be able able to efficiently apply deodorant Baseline: 06/16/23: shoulder flexion 80 (108), abd 76(106) Pt. Is able to apply deodorant to the left, however takes increased time.; 06/02/23: shoulder flexion 77 (104), abd 70 (100); 04/21/2023: shoulder flexion: 40(87), Abduction: 48(76) 03/10/2023: Shoulder flexion: 35(85) 01/13/2023:  Shoulder flexion: 35(82), Abduction: 47(70) 12/02/2022: Shoulder flexion: 32(82), Abduction: 45(60)  10/23/2022: Shoulder flexion: 26(80), Abduction: 32(50) 10/09/2022: flexion: 0(55), Abduction: 32(54) Goal status: Improving, ongoing   2.  Pt. will be able to independently hold and use a blow dryer, and brush for hair care. Baseline: 06/16/2023: Pt can hold a blow dryer but reaches only to L side of head and lower part of back  of head with brush and blow dryer d/t limited ER ROM and strength; 06/02/23: Pt can hold a blow dryer but reaches only to L side of head and lower part of back of head with brush and blow dryer d/t limited ER ROM and strength; 04/21/2023: Pt. is able to hold the hair dryer in the left hand, however is unable to reach up to dry it, and has difficulty using a brush with the left UE. 03/10/2023: Pt. continues to be able to hold the hair dryer in the left hand, however is unable to reach up to dry it, and has difficulty using a brush 01/13/2023: Pt. Can hold the hair dryer in the left hand, however is unable to reach up to dry it, and has difficulty using a brush. 12/02/2022: Pt. is unable to actively elevate he left shoulder in order to blow dry her hair. 10/23/2022: Pt. continues to be unable to hold a blow dryer. Eval: Pt. Is unable to sustain her BUEs in elevation, and use a blow dryer, and brush. 10/09/2022: Pt. Is unable to hold a blow dryer. Eval: Pt. Is unable to sustain her BUEs in elevation, and use a blow dryer, and brush. Goal status: improving/ongoing   3.  Pt.  Will improve left lateral pinch strength by 3# to be able to independently cut meat. Baseline:06/16/2023: Lateral pinch: Left: 5 lbs. Pt. Has difficulty cutting steak, however is able to cut tender pieces of chicken. 06/02/23: L lateral pinch: 6# (pt reports she lacks the strength to cut steak but can cut tender chicken); 04/21/2023: NT(Pinch Meter sent out for calibration) Pt. continues to have difficulty cutting meat. 03/10/2023: Left lateral pinch strength: 9#   01/13/2023: Left lateral pinch strength: 9#  12/02/2022: Left pinch: 7# Pt. has difficulty cutting meat. 10/23/2022: Pt. Continues to present with difficulty cutting meat. 10/09/2022: 1#  Eval: 2#. Pt. has difficulty stabilizing utensil, and food while cutting food. Goal status: Ongoing   4.  Pt. Will improve Left hand [facility] skills to be able to be able to independently, and efficiently manipulate small objects during ADL tasks. Baseline: 06/16/2023: Left 45 sec. 06/02/2023: L 47 sec; 04/21/2023:  46 sec. 03/10/2023: 54 sec. 01/13/2023: 51 sec. 12/02/2022: TBD  10/23/2022: Pt. Continues to present with difficulty manipulating small objects. 10/09/2022: Left: 1 min & 35 sec.: Eval: Left: 52 sec. Right: 27 sec. Goal status: Improving, Ongoing    6.: Pt. will improve with left grip strength to be able to hold and pull up pants Baseline:  06/16/2023: Left grip strength: 19# Pt. Is now able to pull her pants up.06/02/23: L grip 19#, improving; 04/21/2023: Pt. is able to pull up covers, however has difficulty pulling up pants. 03/10/2023: left  grip strength: 15# 01/13/2023: left  grip strength: 15# 12/02/2022: 17# 10/23/2022: Left: 16# 10/09/2022: Left: 10# Eval:  Left grip strength 11#. Pt. has difficulty holding a full drink  securely with the left hand.             Goal status: Improved   7. Pt. will improve typing speed, and accuracy in preparation for efficiently typing simple email message. Baseline: 06/16/2023:  Limited ability to carry-over practice at  home due to Pt.'s mouse at home is not working. Pt. Also can not see the keys on her computer at home.  06/02/23: same as 04/21/23; 04/21/2023:  Pt. continues to present with limited speed, and accuracy with typing  03/10/2023: Pt. continues to present with limited speed, and accuracy with typing 01/13/2023:  TBD 12/02/2022:  Pt. continues to present with limited speed, and accuracy with typing.10/23/2022: Pt. continues to present with limited speed, and accuracy with typing. 10/09/2022: Pt. presents with difficulty typing. Pt. presents with difficulty typing an email. Typing speed, and accuracy TBD             Goal status: Deferred                                CLINICAL IMPRESSION:  Pt. tolerated the exercises well today. Pt. was able to reach further today with less assistance required proximally when reaching up to place the washers over the vertical, and diagonal dowels. Pt. Fewer steps on the finger ladder without going up on her toes while reaching. Pt. continues to work on improving LUE ROM, and functioning in order to maximize independence with ADLs, and IADLs. Pt. continues to benefit from OT services to work towards improving Left UE functioning in order to maximize engagement in, and overall independence with ADLs, and IADL tasks, while reducing risk of adhesive capsulitis.     PERFORMANCE DEFICITS in functional skills including ADLs, IADLs, coordination, dexterity, sensation, ROM, strength, FMC, decreased knowledge of use of DME, vision, and UE functional use, cognitive skills including attention, and psychosocial skills including coping strategies, environmental adaptation, habits, and routines and behaviors.    IMPAIRMENTS are limiting patient from ADLs, IADLs, and social participation.    COMORBIDITIES may have co-morbidities  that affects occupational performance. Patient will benefit from skilled OT to address above impairments and improve overall function.   MODIFICATION OR ASSISTANCE TO  COMPLETE EVALUATION: Min-Moderate modification of tasks or assist with assess necessary to complete an evaluation.   OT OCCUPATIONAL PROFILE AND HISTORY: Detailed assessment: Review of records and additional review of physical, cognitive, psychosocial history related to current functional performance.   CLINICAL DECISION MAKING: Moderate - several treatment options, min-mod task modification necessary   REHAB POTENTIAL: Good   EVALUATION COMPLEXITY: Moderate      PLAN: OT FREQUENCY: 2x/week   OT DURATION: 12 weeks   PLANNED INTERVENTIONS: self care/ADL training, therapeutic exercise, therapeutic activity, neuromuscular re-education, manual therapy, passive range of motion, and paraffin   RECOMMENDED OTHER SERVICES:    CONSULTED AND AGREED WITH PLAN OF CARE: Patient   PLAN FOR NEXT SESSION:  Left hand strengthening and coordination exercises  Olegario Messier, MS, OTR/L    07/09/23, 10:55 AM

## 2023-07-16 ENCOUNTER — Ambulatory Visit: Payer: Medicare HMO | Admitting: Occupational Therapy

## 2023-07-16 DIAGNOSIS — M6281 Muscle weakness (generalized): Secondary | ICD-10-CM

## 2023-07-16 NOTE — Therapy (Signed)
Occupational Therapy Treatment Note   Patient Name: Alyssa Mayo MRN: [id number] DOB:11-Sep-1945, 77 y.o., female Today's Date: 03/14/2022   PCP: Alyssa Mina, MD REFERRING PROVIDER: [name], NP     OT End of Session - 07/16/23 1217     Visit Number 47    Number of Visits 81    Date for OT Re-Evaluation 08/25/23    Authorization Type Progress reporting period starting 06/16/2023    OT Start Time 1153    OT Stop Time 1230    OT Time Calculation (min) 37 min    Activity Tolerance Patient tolerated treatment well    Behavior During Therapy [facility] for tasks assessed/performed                     Past Medical History:  Diagnosis Date   Abnormal levels of other serum enzymes 07/17/2015   Arthritis     Constipation 07/11/2015   COVID-19 03/06/2021   Diabetes mellitus without complication (HCC)     Elevated liver enzymes     Fatty liver     Generalized abdominal pain 07/11/2015   Hyperlipidemia     Hypertension     Lifelong obesity     Macular degeneration     Morbid obesity due to excess calories (HCC) 07/11/2015   Other fatigue 07/11/2015   Sleep apnea     Spinal headache           Past Surgical History:  Procedure Laterality Date   ABDOMINAL HYSTERECTOMY       APPENDECTOMY       CATARACT EXTRACTION       CERVICAL LAMINECTOMY   07/21/1985   CESAREAN SECTION       COLONOSCOPY   07/21/2012    diverticulosis, [facility] Dr. Ricki Mayo    COLONOSCOPY WITH PROPOFOL N/A 02/04/2019    Procedure: COLONOSCOPY WITH PROPOFOL;  Surgeon: Alyssa Deem, MD;  Location: [facility] ENDOSCOPY;  Service: Endoscopy;  Laterality: N/A;   COLONOSCOPY WITH PROPOFOL N/A 03/18/2021    Procedure: COLONOSCOPY WITH PROPOFOL;  Surgeon: Alyssa Bill, MD;  Location: [facility] ENDOSCOPY;  Service: Endoscopy;  Laterality: N/A;  DM   DIAGNOSTIC LAPAROSCOPY       ESOPHAGOGASTRODUODENOSCOPY (EGD) WITH PROPOFOL N/A 02/04/2019    Procedure: ESOPHAGOGASTRODUODENOSCOPY (EGD) WITH PROPOFOL;  Surgeon:  Alyssa Deem, MD;  Location: [facility] ENDOSCOPY;  Service: Endoscopy;  Laterality: N/A;   ESOPHAGOGASTRODUODENOSCOPY (EGD) WITH PROPOFOL N/A 03/18/2021    Procedure: ESOPHAGOGASTRODUODENOSCOPY (EGD) WITH PROPOFOL;  Surgeon: Alyssa Bill, MD;  Location: [facility] ENDOSCOPY;  Service: Endoscopy;  Laterality: N/A;   LOOP RECORDER INSERTION N/A 08/05/2021    Procedure: LOOP RECORDER INSERTION;  Surgeon: Alyssa Maw, MD;  Location: [facility] INVASIVE CV LAB;  Service: Cardiovascular;  Laterality: N/A;   SPINE SURGERY       TUBAL LIGATION            Patient Active Problem List    Diagnosis Date Noted   Paroxysmal atrial fibrillation (HCC) 08/08/2022   Hypercoagulable state due to paroxysmal atrial fibrillation (HCC) 08/08/2022   Cerebral edema (HCC) 08/07/2021   OSA (obstructive sleep apnea) 08/07/2021   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   CVA (cerebral vascular accident) (HCC) 08/01/2021   GERD (gastroesophageal reflux disease) 07/20/2019   Advanced care planning/counseling discussion 12/17/2016   Skin lesions, generalized 11/13/2016   Chronic right hip pain 06/17/2016   Vitamin D deficiency 09/26/2015   Diastasis recti 08/08/2015   Elevated serum GGT level  07/24/2015   Essential hypertension 07/17/2015   Fatty liver 07/17/2015   Elevated alkaline phosphatase level 07/17/2015   Elevated serum glutamic pyruvic transaminase (SGPT) level 07/17/2015   Abnormal finding on EKG 07/11/2015   Morbid obesity (HCC) 07/11/2015   Colon, diverticulosis 07/11/2015   Abdominal wall hernia 07/11/2015   DM (diabetes mellitus), type 2 with neurological complications (HCC)     Hyperlipidemia      ONSET DATE: 08/01/2021   REFERRING DIAG: CVA   THERAPY DIAG:  Muscle weakness (generalized)   Rationale for Evaluation and Treatment Rehabilitation   SUBJECTIVE:   SUBJECTIVE STATEMENT:       Pt. reports having had a nice holiday.  Pt accompanied by: self   PERTINENT HISTORY:  Pt.  is a 77 y.o. female who had a unexpectedly hospitalized from 2/12-2/14/2024 for COVID-19, and a UTI.  Pt. 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 Pt. attended inpatient rehab from 08/07/2021-08/22/2021. Pt. received Home health therapy services this past spring.  Pt. has recently had an assessment through driver rehabilitation services at [facility] with recommendations for referrals for  outpatient OT/PT, and ST services. Pt. PMHx includes: HTN, Hyperlipidemia, Macular degeneration, DM, and Obesity.   PRECAUTIONS: None   WEIGHT BEARING RESTRICTIONS No   PAIN: Are you having pain? No reports of pain  FALLS: Has patient fallen in last 6 months? Yes.   LIVING ENVIRONMENT: Lives with: lives with their family  Son  Alyssa Mayo Lives in: House/apartment Main living are on one floor Stairs:  2 steps to enter Has following equipment at home: Single point cane, Wheelchair (manual), shower chair, Grab bars, bed rail, and rubber mat.   PLOF: Independent   PATIENT GOALS: To be able to Drive, and cook again   OBJECTIVE:    HAND DOMINANCE: Right    ADLs:  Transfers/ambulation related to ADLs: Independent Eating: Independent Grooming: [employer] Pt. reports being unable to use her left hand to perform haircare.  UB Dressing: Independent pullover shirt, independent now with fastening a bra LB Dressing: Independent donning pants socks, and slide on shoes Toileting: Independent Bathing: Independent Tub Shower transfers: Walk-in shower Independent now    IADLs: Shopping: Needs to be accompanied to the grocery store. Light housekeeping: Do  Meal Prep:  Is able to perform light meal prep Community mobility: Relies on son, and friend Medication management: Son sets up Mining engineer management:TBD Handwriting: TBD   MOBILITY STATUS: Hx of falls out of bed     ACTIVITY TOLERANCE: Activity tolerance:  10-20 min. Before rest break   FUNCTIONAL  OUTCOME MEASURES: FOTO: 61  TR score: 62  10/09/2022: FOTO: 49  (Recert) 06/02/23: 65   UPPER EXTREMITY ROM      Active ROM Right eval Left eval Left  10/09/2022 Left 10/23/22 Left 12/02/2022 Left 01/13/2023 Left  03/10/2023 Left 04/21/23 Left 05/12/23 Left 06/02/23 Left 06/16/23  Shoulder flexion WFL 54(74) scaption 0(55) 26(80) 32(82) 35(82) 35(85) 40(87) 70(96) 77 (104) 80(108)  Shoulder abduction WFL 58(75) 32(54) 32(50) 45(60) 47(70) 62(72) 48(76) 73(102) 70 (100) 76(106)  Shoulder adduction               Shoulder extension               Shoulder internal rotation            Thumb to L3 (T10) Thumb to L3 (T10)  Shoulder external rotation            (With elbow at  side) 45 (60) (With elbow at side) 47 (60)  Elbow flexion [address] WFL 145 145 145 145 145 147  WFL WFL  Elbow extension [facility] WFL 10(0) 10(0) WFL WFL [facility] WFL  Wrist flexion WFL TBD [facility]  WFL WFL  Wrist extension WFL TBD [facility] WFL [facility] WFL [facility]  WFL WFL  Wrist ulnar deviation               Wrist radial deviation               Wrist pronation             WFL  Wrist supination             WFL  (Blank rows = not tested)     UPPER EXTREMITY MMT:      MMT Right eval Left eval Left 10/09/2022 Left 10/23/2022 Left 12/02/2022 Left 01/13/2023 Left 03/10/2023 Left 04/21/23 Left 06/02/23 Left 06/16/23  Shoulder flexion 4+/5 2+/5 1/5 2-/5 2-/5 2-/5 2-/5 2-/5 2- 2+/5  Shoulder abduction 4+/5 2+/5 2-/5 2-/5 2-/5 2-/5 2-/5 2-/5 2- 2+/5  Shoulder adduction              Shoulder extension              Shoulder internal rotation           4- 4-/5  Shoulder external rotation           3- 3-/5  Middle trapezius              Lower trapezius              Elbow flexion 4+/5 3+/5 3/5 3/5 3+/5 3+/5 4/5 4/5 4+/5 4+/5  Elbow extension 4+/5 3+/5 3/5 3/5 3+/5 3+/5 4/5 4/5 4/5 4+/5  Wrist flexion 4+/5 TBD 3/5 3/5 3+/5 3+/5 4/5 4/5 4+/5 4+/5  Wrist extension 4+/5 TBD 3/5 3/5 3+/5 3+/5 4/5 4/5 4/5 4/5  Wrist  ulnar deviation              Wrist radial deviation              Wrist pronation              Wrist supination              (Blank rows = not tested)   HAND FUNCTION: Grip strength: Right: 33 lbs; Left: 11 lbs, Lateral pinch: Right: 17 lbs, Left: 2 lbs, and 3 point pinch: Right: 17 lbs, Left: 2 lbs  10/09/2022: Grip strength: Right: 33 lbs; Left: 10 lbs, Lateral pinch: Right: 17 lbs, Left: 1 lbs, and 3 point pinch: Right: 17 lbs, Left: 1 lbs   10/23/2022: Grip strength: Right: 33 lbs; Left: 16 lbs, Lateral pinch: Right: 17 lbs, Left: 1 lbs, and 3 point pinch: Right: 17 lbs, Left: 1 lbs  12/02/2022: Grip strength: Right: 33 lbs; Left: 17 lbs, Lateral pinch: Right: 17 lbs, Left: 7 lbs, and 3 point pinch: Right: 17 lbs, Left: 3 lbs   01/13/2023: Grip strength: Right: 33 lbs; Left: 15 lbs, Lateral pinch: Right: 17 lbs, Left: 9 lbs, and 3 point pinch: Right: 17 lbs, Left: 7 lbs  03/10/2023: Grip strength: Right: 33 lbs; Left: 15 lbs, Lateral pinch: Right: 17 lbs, Left: 9 lbs, and 3 point pinch: Right: 17 lbs, Left: 7 lbs   04/21/2023: Grip strength: Right: 34 lbs; Left: 13 lbs, Lateral pinch: NT 3 point pinch: Right: NT  06/02/23: Grip strength: Right: 34 lbs; Left: 19 lbs, Lateral pinch: Right: 16 lbs; Left: 6 lbs; 3 point pinch: Right: 13 lbs, Left: 7 lbs   06/16/2023: Grip strength: Right: 39 lbs; Left: 19 lbs, Lateral pinch: Right: 16 lbs; Left: 5 lbs; 3 point pinch: Right: 13 lbs, Left: 9 lbs      COORDINATION:   9 Hole Peg test: Right: 27 sec.  sec; Left: 52 sec.  10/09/2022: 9 Hole Peg test: Right: 27 sec.  sec; Left: 1 min. & 35 sec.  01/13/2023: 9 Hole Peg test: Right: 27 sec.  sec; Left: 51. Sec.  03/10/2023: 9 Hole Peg test: Right: 27 sec.  sec; Left: 54 Sec.  04/21/2023: 9 Hole Peg test: Right: 27 sec.  sec; Left: 46 Sec.  06/02/23: 9 hole Peg test: Left: 47 sec  06/15/23: 9 hole Peg test: Left: 45 sec  SENSATION: Light touch: WFL Proprioception: WFL   COGNITION: Overall  cognitive status: Within functional limits for tasks assessed   VISION: Subjective report: Glasses. Just received a new prescription to increase bifocal strength Baseline vision: Macular Degeneration Visual history: macular degeneration   VISION ASSESSMENT: To be further assessed in functional context  Left sided Awareness     PERCEPTION: Limited left sided awareness   TODAY'S TREATMENT:   Therapeutic Exercise:   Pt. worked on BUE strengthening, and reciprocal motion using the UBE while seated for 8 min. with no resistance. Constant monitoring was provided.  Pt. worked on [employer] using the vertical wall finger ladder for 3 trials total. 1st: 18 steps, 2nd: 17 steps, 3rd: 17 steps.  Neuromuscular re-education:  Pt. worked on left hand [facility] skills grasping 1" sticks. Pt. worked on storing the objects in the palm, and translatory skills moving the items from the palm of the hand to the tip of the 2nd digit, and thumb.   PATIENT EDUCATION: Education details: UE strengthening, FMC, functional reach Person educated: Patient Education method: verbal cues Education comprehension: verbalized understanding    HOME EXERCISE PROGRAM:  Reviewed home activities to enhance left hand digit extension during typing skills.   GOALS: Goals reviewed with patient? Yes     SHORT TERM GOALS: Target date: 07/21/23   Pt. Will improve FOTO score by 2 points to reflect improved pt. perceived functional performance Baseline: 06/16/2023: FOTO score: 70 06/02/23: 65; 04/21/2023: 49 03/10/2023: 59 01/13/2023: 49 12/01/2022: FOTO: 51 10/23/2022: FOTO 49 10/09/2022: FOTO 49, FOTO: 61 TR score: 62 Goal status: achieved/ongoing   LONG TERM GOALS: Target date: 08/25/2023   Pt. Will improve left shoulder ROM by 10 degrees to be able able to efficiently apply deodorant Baseline: 06/16/23: shoulder flexion 80 (108), abd 76(106) Pt. Is able to apply deodorant to the left, however takes increased time.; 06/02/23:  shoulder flexion 77 (104), abd 70 (100); 04/21/2023: shoulder flexion: 40(87), Abduction: 48(76) 03/10/2023: Shoulder flexion: 35(85) 01/13/2023:  Shoulder flexion: 35(82), Abduction: 47(70) 12/02/2022: Shoulder flexion: 32(82), Abduction: 45(60)  10/23/2022: Shoulder flexion: 26(80), Abduction: 32(50) 10/09/2022: flexion: 0(55), Abduction: 32(54) Goal status: Improving, ongoing   2.  Pt. will be able to independently hold and use a blow dryer, and brush for hair care. Baseline: 06/16/2023: Pt can hold a blow dryer but reaches only to L side of head and lower part of back of head with brush and blow dryer d/t limited ER ROM and strength; 06/02/23: Pt can hold a blow dryer but reaches only to L side of head and lower part of back of head with  brush and blow dryer d/t limited ER ROM and strength; 04/21/2023: Pt. is able to hold the hair dryer in the left hand, however is unable to reach up to dry it, and has difficulty using a brush with the left UE. 03/10/2023: Pt. continues to be able to hold the hair dryer in the left hand, however is unable to reach up to dry it, and has difficulty using a brush 01/13/2023: Pt. Can hold the hair dryer in the left hand, however is unable to reach up to dry it, and has difficulty using a brush. 12/02/2022: Pt. is unable to actively elevate he left shoulder in order to blow dry her hair. 10/23/2022: Pt. continues to be unable to hold a blow dryer. Eval: Pt. Is unable to sustain her BUEs in elevation, and use a blow dryer, and brush. 10/09/2022: Pt. Is unable to hold a blow dryer. Eval: Pt. Is unable to sustain her BUEs in elevation, and use a blow dryer, and brush. Goal status: improving/ongoing   3.  Pt. Will improve left lateral pinch strength by 3# to be able to independently cut meat. Baseline:06/16/2023: Lateral pinch: Left: 5 lbs. Pt. Has difficulty cutting steak, however is able to cut tender pieces of chicken. 06/02/23: L lateral pinch: 6# (pt reports she lacks the strength to  cut steak but can cut tender chicken); 04/21/2023: NT(Pinch Meter sent out for calibration) Pt. continues to have difficulty cutting meat. 03/10/2023: Left lateral pinch strength: 9#   01/13/2023: Left lateral pinch strength: 9#  12/02/2022: Left pinch: 7# Pt. has difficulty cutting meat. 10/23/2022: Pt. Continues to present with difficulty cutting meat. 10/09/2022: 1#  Eval: 2#. Pt. has difficulty stabilizing utensil, and food while cutting food. Goal status: Ongoing   4.  Pt. Will improve Left hand [facility] skills to be able to be able to independently, and efficiently manipulate small objects during ADL tasks. Baseline: 06/16/2023: Left 45 sec. 06/02/2023: L 47 sec; 04/21/2023:  46 sec. 03/10/2023: 54 sec. 01/13/2023: 51 sec. 12/02/2022: TBD  10/23/2022: Pt. Continues to present with difficulty manipulating small objects. 10/09/2022: Left: 1 min & 35 sec.: Eval: Left: 52 sec. Right: 27 sec. Goal status: Improving, Ongoing    6.: Pt. will improve with left grip strength to be able to hold and pull up pants Baseline:  06/16/2023: Left grip strength: 19# Pt. Is now able to pull her pants up.06/02/23: L grip 19#, improving; 04/21/2023: Pt. is able to pull up covers, however has difficulty pulling up pants. 03/10/2023: left  grip strength: 15# 01/13/2023: left  grip strength: 15# 12/02/2022: 17# 10/23/2022: Left: 16# 10/09/2022: Left: 10# Eval:  Left grip strength 11#. Pt. has difficulty holding a full drink  securely with the left hand.             Goal status: Improved   7. Pt. will improve typing speed, and accuracy in preparation for efficiently typing simple email message. Baseline: 06/16/2023:  Limited ability to carry-over practice at home due to Pt.'s mouse at home is not working. Pt. Also can not see the keys on her computer at home. 06/02/23: same as 04/21/23; 04/21/2023:  Pt. continues to present with limited speed, and accuracy with typing  03/10/2023: Pt. continues to present with limited speed, and accuracy with  typing 01/13/2023: TBD 12/02/2022:  Pt. continues to present with limited speed, and accuracy with typing.10/23/2022: Pt. continues to present with limited speed, and accuracy with typing. 10/09/2022: Pt. presents with difficulty typing. Pt. presents with difficulty typing an  email. Typing speed, and accuracy TBD             Goal status: Deferred                                CLINICAL IMPRESSION:  Pt. was late for the session. Pt. tolerated the exercises well today. Pt. was able to achieve more steps on the finger ladder without going up on her toes while reaching. Pt. continues to work on improving LUE ROM, and functioning in order to maximize independence with ADLs, and IADLs. Pt. dropped multiple 1" sticks from her left hand during [facility] tasks. Pt. was unaware that she had dropped them from her hand. Pt. continues to benefit from OT services to work towards improving Left UE functioning in order to maximize engagement in, and overall independence with ADLs, and IADL tasks, while reducing risk of adhesive capsulitis.     PERFORMANCE DEFICITS in functional skills including ADLs, IADLs, coordination, dexterity, sensation, ROM, strength, FMC, decreased knowledge of use of DME, vision, and UE functional use, cognitive skills including attention, and psychosocial skills including coping strategies, environmental adaptation, habits, and routines and behaviors.    IMPAIRMENTS are limiting patient from ADLs, IADLs, and social participation.    COMORBIDITIES may have co-morbidities  that affects occupational performance. Patient will benefit from skilled OT to address above impairments and improve overall function.   MODIFICATION OR ASSISTANCE TO COMPLETE EVALUATION: Min-Moderate modification of tasks or assist with assess necessary to complete an evaluation.   OT OCCUPATIONAL PROFILE AND HISTORY: Detailed assessment: Review of records and additional review of physical, cognitive, psychosocial history related to  current functional performance.   CLINICAL DECISION MAKING: Moderate - several treatment options, min-mod task modification necessary   REHAB POTENTIAL: Good   EVALUATION COMPLEXITY: Moderate      PLAN: OT FREQUENCY: 2x/week   OT DURATION: 12 weeks   PLANNED INTERVENTIONS: self care/ADL training, therapeutic exercise, therapeutic activity, neuromuscular re-education, manual therapy, passive range of motion, and paraffin   RECOMMENDED OTHER SERVICES:    CONSULTED AND AGREED WITH PLAN OF CARE: Patient   PLAN FOR NEXT SESSION:  Left hand strengthening and coordination exercises  Olegario Messier, MS, OTR/L    07/16/23, 12:19 PM

## 2023-07-21 ENCOUNTER — Ambulatory Visit: Payer: Medicare HMO | Admitting: Occupational Therapy

## 2023-07-21 DIAGNOSIS — M6281 Muscle weakness (generalized): Secondary | ICD-10-CM

## 2023-07-21 DIAGNOSIS — R278 Other lack of coordination: Secondary | ICD-10-CM

## 2023-07-21 NOTE — Therapy (Signed)
 Occupational Therapy Treatment Note   Patient Name: Alyssa Mayo MRN: [id number] DOB:02-15-1946, 77 y.o., female Today's Date: 03/14/2022   PCP: Alyssa Null, MD REFERRING PROVIDER: Harlene Bogaert, NP     OT End of Session - 07/21/23 1035     Visit Number 48    Number of Visits 81    Date for OT Re-Evaluation 08/25/23    Authorization Type Progress reporting period starting 06/16/2023    OT Start Time 1015    OT Stop Time 1100    OT Time Calculation (min) 45 min    Activity Tolerance Patient tolerated treatment well    Behavior During Therapy [facility] for tasks assessed/performed                     Past Medical History:  Diagnosis Date   Abnormal levels of other serum enzymes 07/17/2015   Arthritis     Constipation 07/11/2015   COVID-19 03/06/2021   Diabetes mellitus without complication (HCC)     Elevated liver enzymes     Fatty liver     Generalized abdominal pain 07/11/2015   Hyperlipidemia     Hypertension     Lifelong obesity     Macular degeneration     Morbid obesity due to excess calories (HCC) 07/11/2015   Other fatigue 07/11/2015   Sleep apnea     Spinal headache           Past Surgical History:  Procedure Laterality Date   ABDOMINAL HYSTERECTOMY       APPENDECTOMY       CATARACT EXTRACTION       CERVICAL LAMINECTOMY   07/21/1985   CESAREAN SECTION       COLONOSCOPY   07/21/2012    diverticulosis, [facility] Dr. Luellen    COLONOSCOPY WITH PROPOFOL  N/A 02/04/2019    Procedure: COLONOSCOPY WITH PROPOFOL ;  Surgeon: Alyssa Gladis PENNER, MD;  Location: [facility] ENDOSCOPY;  Service: Endoscopy;  Laterality: N/A;   COLONOSCOPY WITH PROPOFOL  N/A 03/18/2021    Procedure: COLONOSCOPY WITH PROPOFOL ;  Surgeon: Alyssa Ole DASEN, MD;  Location: [facility] ENDOSCOPY;  Service: Endoscopy;  Laterality: N/A;  DM   DIAGNOSTIC LAPAROSCOPY       ESOPHAGOGASTRODUODENOSCOPY (EGD) WITH PROPOFOL  N/A 02/04/2019    Procedure: ESOPHAGOGASTRODUODENOSCOPY (EGD) WITH PROPOFOL ;  Surgeon:  Alyssa Gladis PENNER, MD;  Location: [facility] ENDOSCOPY;  Service: Endoscopy;  Laterality: N/A;   ESOPHAGOGASTRODUODENOSCOPY (EGD) WITH PROPOFOL  N/A 03/18/2021    Procedure: ESOPHAGOGASTRODUODENOSCOPY (EGD) WITH PROPOFOL ;  Surgeon: Alyssa Ole DASEN, MD;  Location: [facility] ENDOSCOPY;  Service: Endoscopy;  Laterality: N/A;   LOOP RECORDER INSERTION N/A 08/05/2021    Procedure: LOOP RECORDER INSERTION;  Surgeon: Alyssa Danelle ORN, MD;  Location: [facility] INVASIVE CV LAB;  Service: Cardiovascular;  Laterality: N/A;   SPINE SURGERY       TUBAL LIGATION            Patient Active Problem List    Diagnosis Date Noted   Paroxysmal atrial fibrillation (HCC) 08/08/2022   Hypercoagulable state due to paroxysmal atrial fibrillation (HCC) 08/08/2022   Cerebral edema (HCC) 08/07/2021   OSA (obstructive sleep apnea) 08/07/2021   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   CVA (cerebral vascular accident) (HCC) 08/01/2021   GERD (gastroesophageal reflux disease) 07/20/2019   Advanced care planning/counseling discussion 12/17/2016   Skin lesions, generalized 11/13/2016   Chronic right hip pain 06/17/2016   Vitamin D  deficiency 09/26/2015   Diastasis recti 08/08/2015   Elevated serum GGT level  07/24/2015   Essential hypertension 07/17/2015   Fatty liver 07/17/2015   Elevated alkaline phosphatase level 07/17/2015   Elevated serum glutamic pyruvic transaminase (SGPT) level 07/17/2015   Abnormal finding on EKG 07/11/2015   Morbid obesity (HCC) 07/11/2015   Colon, diverticulosis 07/11/2015   Abdominal wall hernia 07/11/2015   DM (diabetes mellitus), type 2 with neurological complications (HCC)     Hyperlipidemia      ONSET DATE: 08/01/2021   REFERRING DIAG: CVA   THERAPY DIAG:  Muscle weakness (generalized)   Rationale for Evaluation and Treatment Rehabilitation   SUBJECTIVE:   SUBJECTIVE STATEMENT:       Pt. Reports doing well today.  Pt accompanied by: self   PERTINENT HISTORY:  Pt. is a 77  y.o. female who had a unexpectedly hospitalized from 2/12-2/14/2024 for COVID-19, and a UTI.  Pt. 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 Pt. attended inpatient rehab from 08/07/2021-08/22/2021. Pt. received Home health therapy services this past spring.  Pt. has recently had an assessment through driver rehabilitation services at [facility] with recommendations for referrals for  outpatient OT/PT, and ST services. Pt. PMHx includes: HTN, Hyperlipidemia, Macular degeneration, DM, and Obesity.   PRECAUTIONS: None   WEIGHT BEARING RESTRICTIONS No   PAIN: Are you having pain? No reports of pain  FALLS: Has patient fallen in last 6 months? Yes.   LIVING ENVIRONMENT: Lives with: lives with their family  Son  Alyssa Mayo Lives in: House/apartment Main living are on one floor Stairs:  2 steps to enter Has following equipment at home: Single point cane, Wheelchair (manual), shower chair, Grab bars, bed rail, and rubber mat.   PLOF: Independent   PATIENT GOALS: To be able to Drive, and cook again   OBJECTIVE:    HAND DOMINANCE: Right    ADLs:  Transfers/ambulation related to ADLs: Independent Eating: Independent Grooming: [employer] Pt. reports being unable to use her left hand to perform haircare.  UB Dressing: Independent pullover shirt, independent now with fastening a bra LB Dressing: Independent donning pants socks, and slide on shoes Toileting: Independent Bathing: Independent Tub Shower transfers: Walk-in shower Independent now    IADLs: Shopping: Needs to be accompanied to the grocery store. Light housekeeping: Do  Meal Prep:  Is able to perform light meal prep Community mobility: Relies on son, and friend Medication management: Son sets up Mining Engineer management:TBD Handwriting: TBD   MOBILITY STATUS: Hx of falls out of bed     ACTIVITY TOLERANCE: Activity tolerance:  10-20 min. Before rest break   FUNCTIONAL OUTCOME  MEASURES: FOTO: 61  TR score: 62  10/09/2022: FOTO: 49  (Recert) 06/02/23: 65   UPPER EXTREMITY ROM      Active ROM Right eval Left eval Left  10/09/2022 Left 10/23/22 Left 12/02/2022 Left 01/13/2023 Left  03/10/2023 Left 04/21/23 Left 05/12/23 Left 06/02/23 Left 06/16/23  Shoulder flexion WFL 54(74) scaption 0(55) 26(80) 32(82) 35(82) 35(85) 40(87) 70(96) 77 (104) 80(108)  Shoulder abduction WFL 58(75) 32(54) 32(50) 45(60) 47(70) 62(72) 48(76) 73(102) 70 (100) 76(106)  Shoulder adduction               Shoulder extension               Shoulder internal rotation            Thumb to L3 (T10) Thumb to L3 (T10)  Shoulder external rotation            (With elbow at side) 45 (  60) (With elbow at side) 47 (60)  Elbow flexion [facility] WFL 145 145 145 145 145 147  WFL WFL  Elbow extension [facility] WFL 10(0) 10(0) WFL WFL [facility] WFL  Wrist flexion WFL TBD [facility] WFL  Wrist extension WFL TBD [facility] WFL [facility] WFL [facility]  WFL WFL  Wrist ulnar deviation               Wrist radial deviation               Wrist pronation             WFL  Wrist supination             WFL  (Blank rows = not tested)     UPPER EXTREMITY MMT:      MMT Right eval Left eval Left 10/09/2022 Left 10/23/2022 Left 12/02/2022 Left 01/13/2023 Left 03/10/2023 Left 04/21/23 Left 06/02/23 Left 06/16/23  Shoulder flexion 4+/5 2+/5 1/5 2-/5 2-/5 2-/5 2-/5 2-/5 2- 2+/5  Shoulder abduction 4+/5 2+/5 2-/5 2-/5 2-/5 2-/5 2-/5 2-/5 2- 2+/5  Shoulder adduction              Shoulder extension              Shoulder internal rotation           4- 4-/5  Shoulder external rotation           3- 3-/5  Middle trapezius              Lower trapezius              Elbow flexion 4+/5 3+/5 3/5 3/5 3+/5 3+/5 4/5 4/5 4+/5 4+/5  Elbow extension 4+/5 3+/5 3/5 3/5 3+/5 3+/5 4/5 4/5 4/5 4+/5  Wrist flexion 4+/5 TBD 3/5 3/5 3+/5 3+/5 4/5 4/5 4+/5 4+/5  Wrist extension 4+/5 TBD 3/5 3/5 3+/5 3+/5 4/5 4/5 4/5 4/5  Wrist ulnar  deviation              Wrist radial deviation              Wrist pronation              Wrist supination              (Blank rows = not tested)   HAND FUNCTION: Grip strength: Right: 33 lbs; Left: 11 lbs, Lateral pinch: Right: 17 lbs, Left: 2 lbs, and 3 point pinch: Right: 17 lbs, Left: 2 lbs  10/09/2022: Grip strength: Right: 33 lbs; Left: 10 lbs, Lateral pinch: Right: 17 lbs, Left: 1 lbs, and 3 point pinch: Right: 17 lbs, Left: 1 lbs   10/23/2022: Grip strength: Right: 33 lbs; Left: 16 lbs, Lateral pinch: Right: 17 lbs, Left: 1 lbs, and 3 point pinch: Right: 17 lbs, Left: 1 lbs  12/02/2022: Grip strength: Right: 33 lbs; Left: 17 lbs, Lateral pinch: Right: 17 lbs, Left: 7 lbs, and 3 point pinch: Right: 17 lbs, Left: 3 lbs   01/13/2023: Grip strength: Right: 33 lbs; Left: 15 lbs, Lateral pinch: Right: 17 lbs, Left: 9 lbs, and 3 point pinch: Right: 17 lbs, Left: 7 lbs  03/10/2023: Grip strength: Right: 33 lbs; Left: 15 lbs, Lateral pinch: Right: 17 lbs, Left: 9 lbs, and 3 point pinch: Right: 17 lbs, Left: 7 lbs   04/21/2023: Grip strength: Right: 34 lbs; Left: 13 lbs, Lateral pinch: NT 3 point pinch: Right: NT  06/02/23:  Grip strength: Right: 34 lbs; Left: 19 lbs, Lateral pinch: Right: 16 lbs; Left: 6 lbs; 3 point pinch: Right: 13 lbs, Left: 7 lbs   06/16/2023: Grip strength: Right: 39 lbs; Left: 19 lbs, Lateral pinch: Right: 16 lbs; Left: 5 lbs; 3 point pinch: Right: 13 lbs, Left: 9 lbs      COORDINATION:   9 Hole Peg test: Right: 27 sec.  sec; Left: 52 sec.  10/09/2022: 9 Hole Peg test: Right: 27 sec.  sec; Left: 1 min. & 35 sec.  01/13/2023: 9 Hole Peg test: Right: 27 sec.  sec; Left: 51. Sec.  03/10/2023: 9 Hole Peg test: Right: 27 sec.  sec; Left: 54 Sec.  04/21/2023: 9 Hole Peg test: Right: 27 sec.  sec; Left: 46 Sec.  06/02/23: 9 hole Peg test: Left: 47 sec  06/15/23: 9 hole Peg test: Left: 45 sec  SENSATION: Light touch: WFL Proprioception: WFL   COGNITION: Overall  cognitive status: Within functional limits for tasks assessed   VISION: Subjective report: Glasses. Just received a new prescription to increase bifocal strength Baseline vision: Macular Degeneration Visual history: macular degeneration   VISION ASSESSMENT: To be further assessed in functional context  Left sided Awareness     PERCEPTION: Limited left sided awareness   TODAY'S TREATMENT:   Therapeutic Exercise:  Pt. worked on the [employer] for 8 min. with constant monitoring of the BUEs. Pt. Worked on workload level 4.0. Rest breaks were required.  Pt. worked on [employer] using the vertical wall finger ladder for 3 trials of 16 steps each.  Neuromuscular re-education:  Pt. worked on left hand [facility] skills grasping 1 sticks. Pt. worked on storing the objects in the palm, and translatory skills moving the items from the palm of the hand to the tip of the 2nd digit, and thumb in preparation for placing them into the pegboard.   PATIENT EDUCATION: Education details: UE strengthening, FMC, functional reach Person educated: Patient Education method: verbal cues Education comprehension: verbalized understanding    HOME EXERCISE PROGRAM:  Reviewed home activities to enhance left hand digit extension during typing skills.   GOALS: Goals reviewed with patient? Yes     SHORT TERM GOALS: Target date: 07/21/23   Pt. Will improve FOTO score by 2 points to reflect improved pt. perceived functional performance Baseline: 06/16/2023: FOTO score: 70 06/02/23: 65; 04/21/2023: 49 03/10/2023: 59 01/13/2023: 49 12/01/2022: FOTO: 51 10/23/2022: FOTO 49 10/09/2022: FOTO 49, FOTO: 61 TR score: 62 Goal status: achieved/ongoing   LONG TERM GOALS: Target date: 08/25/2023   Pt. Will improve left shoulder ROM by 10 degrees to be able able to efficiently apply deodorant Baseline: 06/16/23: shoulder flexion 80 (108), abd 76(106) Pt. Is able to apply deodorant to the left, however takes increased time.; 06/02/23:  shoulder flexion 77 (104), abd 70 (100); 04/21/2023: shoulder flexion: 40(87), Abduction: 48(76) 03/10/2023: Shoulder flexion: 35(85) 01/13/2023:  Shoulder flexion: 35(82), Abduction: 47(70) 12/02/2022: Shoulder flexion: 32(82), Abduction: 45(60)  10/23/2022: Shoulder flexion: 26(80), Abduction: 32(50) 10/09/2022: flexion: 0(55), Abduction: 32(54) Goal status: Improving, ongoing   2.  Pt. will be able to independently hold and use a blow dryer, and brush for hair care. Baseline: 06/16/2023: Pt can hold a blow dryer but reaches only to L side of head and lower part of back of head with brush and blow dryer d/t limited ER ROM and strength; 06/02/23: Pt can hold a blow dryer but reaches only to L side of head and lower part of back of head  with brush and blow dryer d/t limited ER ROM and strength; 04/21/2023: Pt. is able to hold the hair dryer in the left hand, however is unable to reach up to dry it, and has difficulty using a brush with the left UE. 03/10/2023: Pt. continues to be able to hold the hair dryer in the left hand, however is unable to reach up to dry it, and has difficulty using a brush 01/13/2023: Pt. Can hold the hair dryer in the left hand, however is unable to reach up to dry it, and has difficulty using a brush. 12/02/2022: Pt. is unable to actively elevate he left shoulder in order to blow dry her hair. 10/23/2022: Pt. continues to be unable to hold a blow dryer. Eval: Pt. Is unable to sustain her BUEs in elevation, and use a blow dryer, and brush. 10/09/2022: Pt. Is unable to hold a blow dryer. Eval: Pt. Is unable to sustain her BUEs in elevation, and use a blow dryer, and brush. Goal status: improving/ongoing   3.  Pt. Will improve left lateral pinch strength by 3# to be able to independently cut meat. Baseline:06/16/2023: Lateral pinch: Left: 5 lbs. Pt. Has difficulty cutting steak, however is able to cut tender pieces of chicken. 06/02/23: L lateral pinch: 6# (pt reports she lacks the strength to  cut steak but can cut tender chicken); 04/21/2023: NT(Pinch Meter sent out for calibration) Pt. continues to have difficulty cutting meat. 03/10/2023: Left lateral pinch strength: 9#   01/13/2023: Left lateral pinch strength: 9#  12/02/2022: Left pinch: 7# Pt. has difficulty cutting meat. 10/23/2022: Pt. Continues to present with difficulty cutting meat. 10/09/2022: 1#  Eval: 2#. Pt. has difficulty stabilizing utensil, and food while cutting food. Goal status: Ongoing   4.  Pt. Will improve Left hand [facility] skills to be able to be able to independently, and efficiently manipulate small objects during ADL tasks. Baseline: 06/16/2023: Left 45 sec. 06/02/2023: L 47 sec; 04/21/2023:  46 sec. 03/10/2023: 54 sec. 01/13/2023: 51 sec. 12/02/2022: TBD  10/23/2022: Pt. Continues to present with difficulty manipulating small objects. 10/09/2022: Left: 1 min & 35 sec.: Eval: Left: 52 sec. Right: 27 sec. Goal status: Improving, Ongoing    6.: Pt. will improve with left grip strength to be able to hold and pull up pants Baseline:  06/16/2023: Left grip strength: 19# Pt. Is now able to pull her pants up.06/02/23: L grip 19#, improving; 04/21/2023: Pt. is able to pull up covers, however has difficulty pulling up pants. 03/10/2023: left  grip strength: 15# 01/13/2023: left  grip strength: 15# 12/02/2022: 17# 10/23/2022: Left: 16# 10/09/2022: Left: 10# Eval:  Left grip strength 11#. Pt. has difficulty holding a full drink  securely with the left hand.             Goal status: Improved   7. Pt. will improve typing speed, and accuracy in preparation for efficiently typing simple email message. Baseline: 06/16/2023:  Limited ability to carry-over practice at home due to Pt.'s mouse at home is not working. Pt. Also can not see the keys on her computer at home. 06/02/23: same as 04/21/23; 04/21/2023:  Pt. continues to present with limited speed, and accuracy with typing  03/10/2023: Pt. continues to present with limited speed, and accuracy with  typing 01/13/2023: TBD 12/02/2022:  Pt. continues to present with limited speed, and accuracy with typing.10/23/2022: Pt. continues to present with limited speed, and accuracy with typing. 10/09/2022: Pt. presents with difficulty typing. Pt. presents with difficulty typing  an email. Typing speed, and accuracy TBD             Goal status: Deferred                                CLINICAL IMPRESSION:  Pt. requires increased time to complete the [facility] task using her left hand with the [place] Pegboard. Pt. Dropped fewer 1 sticks from the palm of her hand when grasping, and storing the pegs, as well as when performing translatory movements with the left hand moving the pegs from the palm to the tip of the and digit, and thumb.  Pt. continues to benefit from OT services to work towards improving Left UE functioning in order to maximize engagement in, and overall independence with ADLs, and IADL tasks, while reducing risk of adhesive capsulitis.     PERFORMANCE DEFICITS in functional skills including ADLs, IADLs, coordination, dexterity, sensation, ROM, strength, FMC, decreased knowledge of use of DME, vision, and UE functional use, cognitive skills including attention, and psychosocial skills including coping strategies, environmental adaptation, habits, and routines and behaviors.    IMPAIRMENTS are limiting patient from ADLs, IADLs, and social participation.    COMORBIDITIES may have co-morbidities  that affects occupational performance. Patient will benefit from skilled OT to address above impairments and improve overall function.   MODIFICATION OR ASSISTANCE TO COMPLETE EVALUATION: Min-Moderate modification of tasks or assist with assess necessary to complete an evaluation.   OT OCCUPATIONAL PROFILE AND HISTORY: Detailed assessment: Review of records and additional review of physical, cognitive, psychosocial history related to current functional performance.   CLINICAL DECISION MAKING: Moderate - several  treatment options, min-mod task modification necessary   REHAB POTENTIAL: Good   EVALUATION COMPLEXITY: Moderate      PLAN: OT FREQUENCY: 2x/week   OT DURATION: 12 weeks   PLANNED INTERVENTIONS: self care/ADL training, therapeutic exercise, therapeutic activity, neuromuscular re-education, manual therapy, passive range of motion, and paraffin   RECOMMENDED OTHER SERVICES:    CONSULTED AND AGREED WITH PLAN OF CARE: Patient   PLAN FOR NEXT SESSION:  Left hand strengthening and coordination exercises  Richardson Otter, MS, OTR/L    07/21/23, 10:49 AM

## 2023-07-23 ENCOUNTER — Ambulatory Visit: Payer: Medicare HMO | Attending: Adult Health | Admitting: Occupational Therapy

## 2023-07-23 DIAGNOSIS — R278 Other lack of coordination: Secondary | ICD-10-CM | POA: Insufficient documentation

## 2023-07-23 DIAGNOSIS — M6281 Muscle weakness (generalized): Secondary | ICD-10-CM | POA: Insufficient documentation

## 2023-07-23 NOTE — Therapy (Signed)
 Occupational Therapy Treatment Note   Patient Name: Alyssa Mayo MRN: [id number] DOB:Feb 19, 1946, 78 y.o., female Today's Date: 03/14/2022   PCP: Lynwood Null, MD REFERRING PROVIDER: Harlene Bogaert, NP     OT End of Session - 07/23/23 1107     Visit Number 49    Number of Visits 81    Date for OT Re-Evaluation 08/25/23    Authorization Type Progress reporting period starting 06/16/2023    OT Start Time 1015    OT Stop Time 1100    OT Time Calculation (min) 45 min    Activity Tolerance Patient tolerated treatment well    Behavior During Therapy [facility] for tasks assessed/performed                     Past Medical History:  Diagnosis Date   Abnormal levels of other serum enzymes 07/17/2015   Arthritis     Constipation 07/11/2015   COVID-19 03/06/2021   Diabetes mellitus without complication (HCC)     Elevated liver enzymes     Fatty liver     Generalized abdominal pain 07/11/2015   Hyperlipidemia     Hypertension     Lifelong obesity     Macular degeneration     Morbid obesity due to excess calories (HCC) 07/11/2015   Other fatigue 07/11/2015   Sleep apnea     Spinal headache           Past Surgical History:  Procedure Laterality Date   ABDOMINAL HYSTERECTOMY       APPENDECTOMY       CATARACT EXTRACTION       CERVICAL LAMINECTOMY   07/21/1985   CESAREAN SECTION       COLONOSCOPY   07/21/2012    diverticulosis, [facility] Dr. Luellen    COLONOSCOPY WITH PROPOFOL  N/A 02/04/2019    Procedure: COLONOSCOPY WITH PROPOFOL ;  Surgeon: Gaylyn Gladis PENNER, MD;  Location: [facility] ENDOSCOPY;  Service: Endoscopy;  Laterality: N/A;   COLONOSCOPY WITH PROPOFOL  N/A 03/18/2021    Procedure: COLONOSCOPY WITH PROPOFOL ;  Surgeon: Maryruth Ole DASEN, MD;  Location: [facility] ENDOSCOPY;  Service: Endoscopy;  Laterality: N/A;  DM   DIAGNOSTIC LAPAROSCOPY       ESOPHAGOGASTRODUODENOSCOPY (EGD) WITH PROPOFOL  N/A 02/04/2019    Procedure: ESOPHAGOGASTRODUODENOSCOPY (EGD) WITH PROPOFOL ;  Surgeon:  Gaylyn Gladis PENNER, MD;  Location: [facility] ENDOSCOPY;  Service: Endoscopy;  Laterality: N/A;   ESOPHAGOGASTRODUODENOSCOPY (EGD) WITH PROPOFOL  N/A 03/18/2021    Procedure: ESOPHAGOGASTRODUODENOSCOPY (EGD) WITH PROPOFOL ;  Surgeon: Maryruth Ole DASEN, MD;  Location: [facility] ENDOSCOPY;  Service: Endoscopy;  Laterality: N/A;   LOOP RECORDER INSERTION N/A 08/05/2021    Procedure: LOOP RECORDER INSERTION;  Surgeon: Waddell Danelle ORN, MD;  Location: [facility] INVASIVE CV LAB;  Service: Cardiovascular;  Laterality: N/A;   SPINE SURGERY       TUBAL LIGATION            Patient Active Problem List    Diagnosis Date Noted   Paroxysmal atrial fibrillation (HCC) 08/08/2022   Hypercoagulable state due to paroxysmal atrial fibrillation (HCC) 08/08/2022   Cerebral edema (HCC) 08/07/2021   OSA (obstructive sleep apnea) 08/07/2021   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   CVA (cerebral vascular accident) (HCC) 08/01/2021   GERD (gastroesophageal reflux disease) 07/20/2019   Advanced care planning/counseling discussion 12/17/2016   Skin lesions, generalized 11/13/2016   Chronic right hip pain 06/17/2016   Vitamin D  deficiency 09/26/2015   Diastasis recti 08/08/2015   Elevated serum GGT level  07/24/2015   Essential hypertension 07/17/2015   Fatty liver 07/17/2015   Elevated alkaline phosphatase level 07/17/2015   Elevated serum glutamic pyruvic transaminase (SGPT) level 07/17/2015   Abnormal finding on EKG 07/11/2015   Morbid obesity (HCC) 07/11/2015   Colon, diverticulosis 07/11/2015   Abdominal wall hernia 07/11/2015   DM (diabetes mellitus), type 2 with neurological complications (HCC)     Hyperlipidemia      ONSET DATE: 08/01/2021   REFERRING DIAG: CVA   THERAPY DIAG:  Muscle weakness (generalized)   Rationale for Evaluation and Treatment Rehabilitation   SUBJECTIVE:   SUBJECTIVE STATEMENT:       Pt. Made a pound cake on New Year's, and reports that she can use the oven at home, however  can not use the stovetop.   Pt accompanied by: self   PERTINENT HISTORY:  Pt. is a 78 y.o. female who had a unexpectedly hospitalized from 2/12-2/14/2024 for COVID-19, and a UTI.  Pt. 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 Pt. attended inpatient rehab from 08/07/2021-08/22/2021. Pt. received Home health therapy services this past spring.  Pt. has recently had an assessment through driver rehabilitation services at [facility] with recommendations for referrals for  outpatient OT/PT, and ST services. Pt. PMHx includes: HTN, Hyperlipidemia, Macular degeneration, DM, and Obesity.   PRECAUTIONS: None   WEIGHT BEARING RESTRICTIONS No   PAIN: Are you having pain? No reports of pain  FALLS: Has patient fallen in last 6 months? Yes.   LIVING ENVIRONMENT: Lives with: lives with their family  Son  [name] Lives in: House/apartment Main living are on one floor Stairs:  2 steps to enter Has following equipment at home: Single point cane, Wheelchair (manual), shower chair, Grab bars, bed rail, and rubber mat.   PLOF: Independent   PATIENT GOALS: To be able to Drive, and cook again   OBJECTIVE:    HAND DOMINANCE: Right    ADLs:  Transfers/ambulation related to ADLs: Independent Eating: Independent Grooming: [employer] Pt. reports being unable to use her left hand to perform haircare.  UB Dressing: Independent pullover shirt, independent now with fastening a bra LB Dressing: Independent donning pants socks, and slide on shoes Toileting: Independent Bathing: Independent Tub Shower transfers: Walk-in shower Independent now    IADLs: Shopping: Needs to be accompanied to the grocery store. Light housekeeping: Do  Meal Prep:  Is able to perform light meal prep Community mobility: Relies on son, and friend Medication management: Son sets up Mining Engineer management:TBD Handwriting: TBD   MOBILITY STATUS: Hx of falls out of bed      ACTIVITY TOLERANCE: Activity tolerance:  10-20 min. Before rest break   FUNCTIONAL OUTCOME MEASURES: FOTO: 61  TR score: 62  10/09/2022: FOTO: 49  (Recert) 06/02/23: 65   UPPER EXTREMITY ROM      Active ROM Right eval Left eval Left  10/09/2022 Left 10/23/22 Left 12/02/2022 Left 01/13/2023 Left  03/10/2023 Left 04/21/23 Left 05/12/23 Left 06/02/23 Left 06/16/23  Shoulder flexion WFL 54(74) scaption 0(55) 26(80) 32(82) 35(82) 35(85) 40(87) 70(96) 77 (104) 80(108)  Shoulder abduction WFL 58(75) 32(54) 32(50) 45(60) 47(70) 62(72) 48(76) 73(102) 70 (100) 76(106)  Shoulder adduction               Shoulder extension               Shoulder internal rotation            Thumb to L3 (T10) Thumb to L3 (T10)  Shoulder external rotation            (With elbow at side) 45 (60) (With elbow at side) 47 (60)  Elbow flexion [facility] WFL 145 145 145 145 145 147  WFL WFL  Elbow extension [facility] WFL 10(0) 10(0) WFL WFL [facility]  [facility] WFL  Wrist flexion WFL TBD [facility]  WFL WFL  Wrist extension WFL TBD [facility] WFL [facility] WFL [facility]  WFL WFL  Wrist ulnar deviation               Wrist radial deviation               Wrist pronation             WFL  Wrist supination             WFL  (Blank rows = not tested)     UPPER EXTREMITY MMT:      MMT Right eval Left eval Left 10/09/2022 Left 10/23/2022 Left 12/02/2022 Left 01/13/2023 Left 03/10/2023 Left 04/21/23 Left 06/02/23 Left 06/16/23  Shoulder flexion 4+/5 2+/5 1/5 2-/5 2-/5 2-/5 2-/5 2-/5 2- 2+/5  Shoulder abduction 4+/5 2+/5 2-/5 2-/5 2-/5 2-/5 2-/5 2-/5 2- 2+/5  Shoulder adduction              Shoulder extension              Shoulder internal rotation           4- 4-/5  Shoulder external rotation           3- 3-/5  Middle trapezius              Lower trapezius              Elbow flexion 4+/5 3+/5 3/5 3/5 3+/5 3+/5 4/5 4/5 4+/5 4+/5  Elbow extension 4+/5 3+/5 3/5 3/5 3+/5 3+/5 4/5 4/5 4/5 4+/5  Wrist flexion 4+/5 TBD 3/5 3/5 3+/5 3+/5 4/5  4/5 4+/5 4+/5  Wrist extension 4+/5 TBD 3/5 3/5 3+/5 3+/5 4/5 4/5 4/5 4/5  Wrist ulnar deviation              Wrist radial deviation              Wrist pronation              Wrist supination              (Blank rows = not tested)   HAND FUNCTION: Grip strength: Right: 33 lbs; Left: 11 lbs, Lateral pinch: Right: 17 lbs, Left: 2 lbs, and 3 point pinch: Right: 17 lbs, Left: 2 lbs  10/09/2022: Grip strength: Right: 33 lbs; Left: 10 lbs, Lateral pinch: Right: 17 lbs, Left: 1 lbs, and 3 point pinch: Right: 17 lbs, Left: 1 lbs   10/23/2022: Grip strength: Right: 33 lbs; Left: 16 lbs, Lateral pinch: Right: 17 lbs, Left: 1 lbs, and 3 point pinch: Right: 17 lbs, Left: 1 lbs  12/02/2022: Grip strength: Right: 33 lbs; Left: 17 lbs, Lateral pinch: Right: 17 lbs, Left: 7 lbs, and 3 point pinch: Right: 17 lbs, Left: 3 lbs   01/13/2023: Grip strength: Right: 33 lbs; Left: 15 lbs, Lateral pinch: Right: 17 lbs, Left: 9 lbs, and 3 point pinch: Right: 17 lbs, Left: 7 lbs  03/10/2023: Grip strength: Right: 33 lbs; Left: 15 lbs, Lateral pinch: Right: 17 lbs, Left: 9 lbs, and 3 point pinch: Right: 17 lbs, Left: 7 lbs  04/21/2023: Grip strength: Right: 34 lbs; Left: 13 lbs, Lateral pinch: NT 3 point pinch: Right: NT  06/02/23: Grip strength: Right: 34 lbs; Left: 19 lbs, Lateral pinch: Right: 16 lbs; Left: 6 lbs; 3 point pinch: Right: 13 lbs, Left: 7 lbs   06/16/2023: Grip strength: Right: 39 lbs; Left: 19 lbs, Lateral pinch: Right: 16 lbs; Left: 5 lbs; 3 point pinch: Right: 13 lbs, Left: 9 lbs      COORDINATION:   9 Hole Peg test: Right: 27 sec.  sec; Left: 52 sec.  10/09/2022: 9 Hole Peg test: Right: 27 sec.  sec; Left: 1 min. & 35 sec.  01/13/2023: 9 Hole Peg test: Right: 27 sec.  sec; Left: 51. Sec.  03/10/2023: 9 Hole Peg test: Right: 27 sec.  sec; Left: 54 Sec.  04/21/2023: 9 Hole Peg test: Right: 27 sec.  sec; Left: 46 Sec.  06/02/23: 9 hole Peg test: Left: 47 sec  06/15/23: 9 hole Peg test: Left:  45 sec  SENSATION: Light touch: WFL Proprioception: WFL   COGNITION: Overall cognitive status: Within functional limits for tasks assessed   VISION: Subjective report: Glasses. Just received a new prescription to increase bifocal strength Baseline vision: Macular Degeneration Visual history: macular degeneration   VISION ASSESSMENT: To be further assessed in functional context  Left sided Awareness     PERCEPTION: Limited left sided awareness   TODAY'S TREATMENT:   Therapeutic Exercise:  Pt. worked on the [employer] for 10 min. with constant monitoring of the BUEs. Pt. Worked on workload level 4.0.    Neuromuscular re-education:  Pt. performed [facility] tasks using the Grooved pegboard. Pt. worked on grasping the grooved pegs from a horizontal position, and moving the pegs to a vertical position in the hand to prepare for placing them in the grooved slot. Pt. Worked on removing the pegs alternating thumb opposition to the 2nd through 5th digit.    PATIENT EDUCATION: Education details: UE strengthening, FMC, functional reach Person educated: Patient Education method: verbal cues Education comprehension: verbalized understanding    HOME EXERCISE PROGRAM:  Reviewed home activities to enhance left hand digit extension during typing skills.   GOALS: Goals reviewed with patient? Yes     SHORT TERM GOALS: Target date: 07/21/23   Pt. Will improve FOTO score by 2 points to reflect improved pt. perceived functional performance Baseline: 06/16/2023: FOTO score: 70 06/02/23: 65; 04/21/2023: 49 03/10/2023: 59 01/13/2023: 49 12/01/2022: FOTO: 51 10/23/2022: FOTO 49 10/09/2022: FOTO 49, FOTO: 61 TR score: 62 Goal status: achieved/ongoing   LONG TERM GOALS: Target date: 08/25/2023   Pt. Will improve left shoulder ROM by 10 degrees to be able able to efficiently apply deodorant Baseline: 06/16/23: shoulder flexion 80 (108), abd 76(106) Pt. Is able to apply deodorant to the left, however takes  increased time.; 06/02/23: shoulder flexion 77 (104), abd 70 (100); 04/21/2023: shoulder flexion: 40(87), Abduction: 48(76) 03/10/2023: Shoulder flexion: 35(85) 01/13/2023:  Shoulder flexion: 35(82), Abduction: 47(70) 12/02/2022: Shoulder flexion: 32(82), Abduction: 45(60)  10/23/2022: Shoulder flexion: 26(80), Abduction: 32(50) 10/09/2022: flexion: 0(55), Abduction: 32(54) Goal status: Improving, ongoing   2.  Pt. will be able to independently hold and use a blow dryer, and brush for hair care. Baseline: 06/16/2023: Pt can hold a blow dryer but reaches only to L side of head and lower part of back of head with brush and blow dryer d/t limited ER ROM and strength; 06/02/23: Pt can hold a blow dryer but reaches only to L side of head and lower  part of back of head with brush and blow dryer d/t limited ER ROM and strength; 04/21/2023: Pt. is able to hold the hair dryer in the left hand, however is unable to reach up to dry it, and has difficulty using a brush with the left UE. 03/10/2023: Pt. continues to be able to hold the hair dryer in the left hand, however is unable to reach up to dry it, and has difficulty using a brush 01/13/2023: Pt. Can hold the hair dryer in the left hand, however is unable to reach up to dry it, and has difficulty using a brush. 12/02/2022: Pt. is unable to actively elevate he left shoulder in order to blow dry her hair. 10/23/2022: Pt. continues to be unable to hold a blow dryer. Eval: Pt. Is unable to sustain her BUEs in elevation, and use a blow dryer, and brush. 10/09/2022: Pt. Is unable to hold a blow dryer. Eval: Pt. Is unable to sustain her BUEs in elevation, and use a blow dryer, and brush. Goal status: improving/ongoing   3.  Pt. Will improve left lateral pinch strength by 3# to be able to independently cut meat. Baseline:06/16/2023: Lateral pinch: Left: 5 lbs. Pt. Has difficulty cutting steak, however is able to cut tender pieces of chicken. 06/02/23: L lateral pinch: 6# (pt reports  she lacks the strength to cut steak but can cut tender chicken); 04/21/2023: NT(Pinch Meter sent out for calibration) Pt. continues to have difficulty cutting meat. 03/10/2023: Left lateral pinch strength: 9#   01/13/2023: Left lateral pinch strength: 9#  12/02/2022: Left pinch: 7# Pt. has difficulty cutting meat. 10/23/2022: Pt. Continues to present with difficulty cutting meat. 10/09/2022: 1#  Eval: 2#. Pt. has difficulty stabilizing utensil, and food while cutting food. Goal status: Ongoing   4.  Pt. Will improve Left hand [facility] skills to be able to be able to independently, and efficiently manipulate small objects during ADL tasks. Baseline: 06/16/2023: Left 45 sec. 06/02/2023: L 47 sec; 04/21/2023:  46 sec. 03/10/2023: 54 sec. 01/13/2023: 51 sec. 12/02/2022: TBD  10/23/2022: Pt. Continues to present with difficulty manipulating small objects. 10/09/2022: Left: 1 min & 35 sec.: Eval: Left: 52 sec. Right: 27 sec. Goal status: Improving, Ongoing    6.: Pt. will improve with left grip strength to be able to hold and pull up pants Baseline:  06/16/2023: Left grip strength: 19# Pt. Is now able to pull her pants up.06/02/23: L grip 19#, improving; 04/21/2023: Pt. is able to pull up covers, however has difficulty pulling up pants. 03/10/2023: left  grip strength: 15# 01/13/2023: left  grip strength: 15# 12/02/2022: 17# 10/23/2022: Left: 16# 10/09/2022: Left: 10# Eval:  Left grip strength 11#. Pt. has difficulty holding a full drink  securely with the left hand.             Goal status: Improved   7. Pt. will improve typing speed, and accuracy in preparation for efficiently typing simple email message. Baseline: 06/16/2023:  Limited ability to carry-over practice at home due to Pt.'s mouse at home is not working. Pt. Also can not see the keys on her computer at home. 06/02/23: same as 04/21/23; 04/21/2023:  Pt. continues to present with limited speed, and accuracy with typing  03/10/2023: Pt. continues to present with limited  speed, and accuracy with typing 01/13/2023: TBD 12/02/2022:  Pt. continues to present with limited speed, and accuracy with typing.10/23/2022: Pt. continues to present with limited speed, and accuracy with typing. 10/09/2022: Pt. presents with difficulty typing.  Pt. presents with difficulty typing an email. Typing speed, and accuracy TBD             Goal status: Deferred                                CLINICAL IMPRESSION:  Pt. requires increased time to complete the [facility] tasks using her left hand with grooved pegboard. Pt. presented with difficulty performing thumb opposition to he 5th digit. Pt. continues to benefit from OT services to work towards improving Left UE functioning in order to maximize engagement in, and overall independence with ADLs, and IADL tasks, while reducing risk of adhesive capsulitis.     PERFORMANCE DEFICITS in functional skills including ADLs, IADLs, coordination, dexterity, sensation, ROM, strength, FMC, decreased knowledge of use of DME, vision, and UE functional use, cognitive skills including attention, and psychosocial skills including coping strategies, environmental adaptation, habits, and routines and behaviors.    IMPAIRMENTS are limiting patient from ADLs, IADLs, and social participation.    COMORBIDITIES may have co-morbidities  that affects occupational performance. Patient will benefit from skilled OT to address above impairments and improve overall function.   MODIFICATION OR ASSISTANCE TO COMPLETE EVALUATION: Min-Moderate modification of tasks or assist with assess necessary to complete an evaluation.   OT OCCUPATIONAL PROFILE AND HISTORY: Detailed assessment: Review of records and additional review of physical, cognitive, psychosocial history related to current functional performance.   CLINICAL DECISION MAKING: Moderate - several treatment options, min-mod task modification necessary   REHAB POTENTIAL: Good   EVALUATION COMPLEXITY: Moderate       PLAN: OT FREQUENCY: 2x/week   OT DURATION: 12 weeks   PLANNED INTERVENTIONS: self care/ADL training, therapeutic exercise, therapeutic activity, neuromuscular re-education, manual therapy, passive range of motion, and paraffin   RECOMMENDED OTHER SERVICES:    CONSULTED AND AGREED WITH PLAN OF CARE: Patient   PLAN FOR NEXT SESSION:  Left hand strengthening and coordination exercises  Richardson Otter, MS, OTR/L    07/23/23, 11:10 AM

## 2023-07-27 ENCOUNTER — Encounter: Payer: Medicare HMO | Attending: Physical Medicine and Rehabilitation | Admitting: Physical Medicine and Rehabilitation

## 2023-07-27 ENCOUNTER — Encounter: Payer: Self-pay | Admitting: Physical Medicine and Rehabilitation

## 2023-07-27 ENCOUNTER — Ambulatory Visit (INDEPENDENT_AMBULATORY_CARE_PROVIDER_SITE_OTHER): Payer: Medicare HMO

## 2023-07-27 VITALS — BP 130/83 | HR 84 | Ht 63.0 in | Wt 185.2 lb

## 2023-07-27 DIAGNOSIS — I639 Cerebral infarction, unspecified: Secondary | ICD-10-CM | POA: Diagnosis not present

## 2023-07-27 DIAGNOSIS — G6289 Other specified polyneuropathies: Secondary | ICD-10-CM | POA: Insufficient documentation

## 2023-07-27 LAB — CUP PACEART REMOTE DEVICE CHECK
Date Time Interrogation Session: 20250105230830
Implantable Pulse Generator Implant Date: 20230116

## 2023-07-27 MED ORDER — CAPSAICIN-CLEANSING GEL 8 % EX KIT
4.0000 | PACK | Freq: Once | CUTANEOUS | Status: AC
Start: 2023-07-27 — End: 2023-07-27
  Administered 2023-07-27: 4 via TOPICAL

## 2023-07-27 NOTE — Addendum Note (Signed)
 Addended by: Horton Chin on: 07/27/2023 11:35 AM   Modules accepted: Orders

## 2023-07-27 NOTE — Progress Notes (Signed)
-  Discussed Qutenza as an option for neuropathic pain control. Discussed that this is a capsaicin patch, stronger than capsaicin cream. Discussed that it is currently approved for diabetic peripheral neuropathy and post-herpetic neuralgia, but that it has also shown benefit in treating other forms of neuropathy. Provided patient with link to site to learn more about the patch: https://www.clark.biz/. Discussed that the patch would be placed in office and benefits usually last 3 months. Discussed that unintended exposure to capsaicin can cause severe irritation of eyes, mucous membranes, respiratory tract, and skin, but that Qutenza is a local treatment and does not have the systemic side effects of other nerve medications. Discussed that there may be pain, itching, erythema, and decreased sensory function associated with the application of Qutenza. Side effects usually subside within 1 week. A cold pack of analgesic medications can help with these side effects. Blood pressure can also be increased due to pain associated with administration of the patch.   4 patches of Qutenza [phone]) was applied to the bilateral feet. Ice packs were applied during the procedure to ensure patient comfort. Blood pressure was monitored every 15 minutes. The patient tolerated the procedure well. Post-procedure instructions were given and follow-up has been scheduled.  Topical system measures 14cm x20cm (280cm for a total 1120units) were applied which will cause deeper penetration for destruction of the peripheral nerve using a chemical (Qutenza) which infuses into the skin like an injection and heat technique (occlusive, compressive dressing cauing endothermic heat technique)

## 2023-07-28 ENCOUNTER — Ambulatory Visit: Payer: Medicare HMO | Admitting: Occupational Therapy

## 2023-07-28 ENCOUNTER — Encounter: Payer: Self-pay | Admitting: Occupational Therapy

## 2023-07-28 DIAGNOSIS — R278 Other lack of coordination: Secondary | ICD-10-CM

## 2023-07-28 DIAGNOSIS — M6281 Muscle weakness (generalized): Secondary | ICD-10-CM

## 2023-07-28 NOTE — Therapy (Signed)
 Occupational Therapy Progress Note  Dates of reporting period  06/16/2023   to   07/28/2023    Patient Name: Alyssa Mayo MRN: [id number] DOB:1946/02/28, 78 y.o., female Today's Date: 03/14/2022   PCP: Lynwood Null, MD REFERRING PROVIDER: Harlene Bogaert, NP     OT End of Session - 07/28/23 1154     Visit Number 50    Date for OT Re-Evaluation 08/25/23    Authorization Type Progress reporting period starting 06/16/2023    OT Start Time 1145    OT Stop Time 1230   OT Time Calculation (min) 45 min    Activity Tolerance Patient tolerated treatment well    Behavior During Therapy [facility] for tasks assessed/performed                     Past Medical History:  Diagnosis Date   Abnormal levels of other serum enzymes 07/17/2015   Arthritis     Constipation 07/11/2015   COVID-19 03/06/2021   Diabetes mellitus without complication (HCC)     Elevated liver enzymes     Fatty liver     Generalized abdominal pain 07/11/2015   Hyperlipidemia     Hypertension     Lifelong obesity     Macular degeneration     Morbid obesity due to excess calories (HCC) 07/11/2015   Other fatigue 07/11/2015   Sleep apnea     Spinal headache           Past Surgical History:  Procedure Laterality Date   ABDOMINAL HYSTERECTOMY       APPENDECTOMY       CATARACT EXTRACTION       CERVICAL LAMINECTOMY   07/21/1985   CESAREAN SECTION       COLONOSCOPY   07/21/2012    diverticulosis, [facility] Dr. Luellen    COLONOSCOPY WITH PROPOFOL  N/A 02/04/2019    Procedure: COLONOSCOPY WITH PROPOFOL ;  Surgeon: Gaylyn Gladis PENNER, MD;  Location: [facility] ENDOSCOPY;  Service: Endoscopy;  Laterality: N/A;   COLONOSCOPY WITH PROPOFOL  N/A 03/18/2021    Procedure: COLONOSCOPY WITH PROPOFOL ;  Surgeon: Maryruth Ole DASEN, MD;  Location: [facility] ENDOSCOPY;  Service: Endoscopy;  Laterality: N/A;  DM   DIAGNOSTIC LAPAROSCOPY       ESOPHAGOGASTRODUODENOSCOPY (EGD) WITH PROPOFOL  N/A 02/04/2019    Procedure:  ESOPHAGOGASTRODUODENOSCOPY (EGD) WITH PROPOFOL ;  Surgeon: Gaylyn Gladis PENNER, MD;  Location: [facility] ENDOSCOPY;  Service: Endoscopy;  Laterality: N/A;   ESOPHAGOGASTRODUODENOSCOPY (EGD) WITH PROPOFOL  N/A 03/18/2021    Procedure: ESOPHAGOGASTRODUODENOSCOPY (EGD) WITH PROPOFOL ;  Surgeon: Maryruth Ole DASEN, MD;  Location: [facility] ENDOSCOPY;  Service: Endoscopy;  Laterality: N/A;   LOOP RECORDER INSERTION N/A 08/05/2021    Procedure: LOOP RECORDER INSERTION;  Surgeon: Waddell Danelle ORN, MD;  Location: [facility] INVASIVE CV LAB;  Service: Cardiovascular;  Laterality: N/A;   SPINE SURGERY       TUBAL LIGATION            Patient Active Problem List    Diagnosis Date Noted   Paroxysmal atrial fibrillation (HCC) 08/08/2022   Hypercoagulable state due to paroxysmal atrial fibrillation (HCC) 08/08/2022   Cerebral edema (HCC) 08/07/2021   OSA (obstructive sleep apnea) 08/07/2021   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   CVA (cerebral vascular accident) (HCC) 08/01/2021   GERD (gastroesophageal reflux disease) 07/20/2019   Advanced care planning/counseling discussion 12/17/2016   Skin lesions, generalized 11/13/2016   Chronic right hip pain 06/17/2016   Vitamin D  deficiency 09/26/2015   Diastasis recti 08/08/2015  Elevated serum GGT level 07/24/2015   Essential hypertension 07/17/2015   Fatty liver 07/17/2015   Elevated alkaline phosphatase level 07/17/2015   Elevated serum glutamic pyruvic transaminase (SGPT) level 07/17/2015   Abnormal finding on EKG 07/11/2015   Morbid obesity (HCC) 07/11/2015   Colon, diverticulosis 07/11/2015   Abdominal wall hernia 07/11/2015   DM (diabetes mellitus), type 2 with neurological complications (HCC)     Hyperlipidemia      ONSET DATE: 08/01/2021   REFERRING DIAG: CVA   THERAPY DIAG:  Muscle weakness (generalized)   Rationale for Evaluation and Treatment Rehabilitation   SUBJECTIVE:   SUBJECTIVE STATEMENT:       Pt.  Reports doing well today  Pt  accompanied by: self   PERTINENT HISTORY:  Pt. is a 78 y.o. female who had a unexpectedly hospitalized from 2/12-2/14/2024 for COVID-19, and a UTI.  Pt. 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 Pt. attended inpatient rehab from 08/07/2021-08/22/2021. Pt. received Home health therapy services this past spring.  Pt. has recently had an assessment through driver rehabilitation services at [facility] with recommendations for referrals for  outpatient OT/PT, and ST services. Pt. PMHx includes: HTN, Hyperlipidemia, Macular degeneration, DM, and Obesity.   PRECAUTIONS: None   WEIGHT BEARING RESTRICTIONS No   PAIN: Are you having pain? No reports of pain  FALLS: Has patient fallen in last 6 months? Yes.   LIVING ENVIRONMENT: Lives with: lives with their family  Son  [name] Lives in: House/apartment Main living are on one floor Stairs:  2 steps to enter Has following equipment at home: Single point cane, Wheelchair (manual), shower chair, Grab bars, bed rail, and rubber mat.   PLOF: Independent   PATIENT GOALS: To be able to Drive, and cook again   OBJECTIVE:    HAND DOMINANCE: Right    ADLs:  Transfers/ambulation related to ADLs: Independent Eating: Independent Grooming: [employer] Pt. reports being unable to use her left hand to perform haircare.  UB Dressing: Independent pullover shirt, independent now with fastening a bra LB Dressing: Independent donning pants socks, and slide on shoes Toileting: Independent Bathing: Independent Tub Shower transfers: Walk-in shower Independent now    IADLs: Shopping: Needs to be accompanied to the grocery store. Light housekeeping: Do  Meal Prep:  Is able to perform light meal prep Community mobility: Relies on son, and friend Medication management: Son sets up Mining Engineer management:TBD Handwriting: TBD   MOBILITY STATUS: Hx of falls out of bed     ACTIVITY TOLERANCE: Activity  tolerance:  10-20 min. Before rest break   FUNCTIONAL OUTCOME MEASURES: FOTO: 61  TR score: 62  10/09/2022: FOTO: 49  (Recert) 06/02/23: 65  07/28/2023:  FOTO score: 62   UPPER EXTREMITY ROM      Active ROM Right eval Left eval Left  10/09/2022 Left 10/23/22 Left 12/02/2022 Left 01/13/2023 Left  03/10/2023 Left 04/21/23 Left 05/12/23 Left 06/02/23 Left 06/16/23 Left 07/28/23  Shoulder flexion WFL 54(74) scaption 0(55) 26(80) 32(82) 35(82) 35(85) 40(87) 70(96) 77 (104) 80(108) 83(108)  Shoulder abduction WFL 58(75) 32(54) 32(50) 45(60) 47(70) 62(72) 48(76) 73(102) 70 (100) 76(106) 80(110)  Shoulder adduction                Shoulder extension                Shoulder internal rotation            Thumb to L3 (T10) Thumb to L3 (T10) Thumb L3 (T10)  Shoulder external rotation            (With elbow at side) 45 (60) (With elbow at side) 47 (60) (With elbow at side) 47  Elbow flexion [facility] WFL 145 145 145 145 145 147  WFL WFL WFL  Elbow extension [facility] WFL 10(0) 10(0) WFL WFL [facility]  [facility] WFL  Wrist flexion WFL TBD [facility]  [facility] WFL WFL  Wrist extension WFL TBD [facility] WFL [facility]  [facility] WFL WFL  Wrist ulnar deviation                Wrist radial deviation                Wrist pronation             [facility] WFL  Wrist supination             WFL WFL  (Blank rows = not tested)     UPPER EXTREMITY MMT:      MMT Right eval Left eval Left 10/09/2022 Left 10/23/2022 Left 12/02/2022 Left 01/13/2023 Left 03/10/2023 Left 04/21/23 Left 06/02/23 Left 06/16/23 Left 07/28/23  Shoulder flexion 4+/5 2+/5 1/5 2-/5 2-/5 2-/5 2-/5 2-/5 2- 2+/5 3-/5  Shoulder abduction 4+/5 2+/5 2-/5 2-/5 2-/5 2-/5 2-/5 2-/5 2- 2+/5 3-/5  Shoulder adduction               Shoulder extension               Shoulder internal rotation           4- 4-/5   Shoulder external rotation           3- 3-/5   Middle trapezius               Lower trapezius               Elbow flexion 4+/5 3+/5 3/5 3/5 3+/5 3+/5  4/5 4/5 4+/5 4+/5 4+/5  Elbow extension 4+/5 3+/5 3/5 3/5 3+/5 3+/5 4/5 4/5 4/5 4+/5 4+/5  Wrist flexion 4+/5 TBD 3/5 3/5 3+/5 3+/5 4/5 4/5 4+/5 4+/5 4+/5  Wrist extension 4+/5 TBD 3/5 3/5 3+/5 3+/5 4/5 4/5 4/5 4/5 4+/5  Wrist ulnar deviation               Wrist radial deviation               Wrist pronation               Wrist supination               (Blank rows = not tested)   HAND FUNCTION: Grip strength: Right: 33 lbs; Left: 11 lbs, Lateral pinch: Right: 17 lbs, Left: 2 lbs, and 3 point pinch: Right: 17 lbs, Left: 2 lbs  10/09/2022: Grip strength: Right: 33 lbs; Left: 10 lbs, Lateral pinch: Right: 17 lbs, Left: 1 lbs, and 3 point pinch: Right: 17 lbs, Left: 1 lbs   10/23/2022: Grip strength: Right: 33 lbs; Left: 16 lbs, Lateral pinch: Right: 17 lbs, Left: 1 lbs, and 3 point pinch: Right: 17 lbs, Left: 1 lbs  12/02/2022: Grip strength: Right: 33 lbs; Left: 17 lbs, Lateral pinch: Right: 17 lbs, Left: 7 lbs, and 3 point pinch: Right: 17 lbs, Left: 3 lbs   01/13/2023: Grip strength: Right: 33 lbs; Left: 15 lbs, Lateral pinch: Right: 17 lbs, Left: 9 lbs, and 3 point pinch: Right: 17 lbs, Left: 7  lbs  03/10/2023: Grip strength: Right: 33 lbs; Left: 15 lbs, Lateral pinch: Right: 17 lbs, Left: 9 lbs, and 3 point pinch: Right: 17 lbs, Left: 7 lbs   04/21/2023: Grip strength: Right: 34 lbs; Left: 13 lbs, Lateral pinch: NT 3 point pinch: Right: NT  06/02/23: Grip strength: Right: 34 lbs; Left: 19 lbs, Lateral pinch: Right: 16 lbs; Left: 6 lbs; 3 point pinch: Right: 13 lbs, Left: 7 lbs   06/16/2023: Grip strength: Right: 39 lbs; Left: 19 lbs, Lateral pinch: Right: 16 lbs; Left: 5 lbs; 3 point pinch: Right: 13 lbs, Left: 9 lbs   07/28/2023: Grip strength: Right: 39 lbs; Left: 20 lbs, Lateral pinch: Right: 16 lbs; Left: 7 lbs; 3 point pinch: Right: 13 lbs, Left: 9 lbs         COORDINATION:   9 Hole Peg test: Right: 27 sec.  sec; Left: 52 sec.  10/09/2022: 9 Hole Peg test: Right: 27 sec.  sec;  Left: 1 min. & 35 sec.  01/13/2023: 9 Hole Peg test: Right: 27 sec.  sec; Left: 51. Sec.  03/10/2023: 9 Hole Peg test: Right: 27 sec.  sec; Left: 54 Sec.  04/21/2023: 9 Hole Peg test: Right: 27 sec.  sec; Left: 46 Sec.  06/02/23: 9 hole Peg test: Left: 47 sec  06/15/23: 9 hole Peg test: Left: 45 sec  07/28/2023: 9 hole Peg test: Left: 44 sec   SENSATION: Light touch: WFL Proprioception: WFL   COGNITION: Overall cognitive status: Within functional limits for tasks assessed   VISION: Subjective report: Glasses. Just received a new prescription to increase bifocal strength Baseline vision: Macular Degeneration Visual history: macular degeneration   VISION ASSESSMENT: To be further assessed in functional context  Left sided Awareness     PERCEPTION: Limited left sided awareness   TODAY'S TREATMENT:   Measurements were obtained, and goals were reviewed with the Pt.   There. Ex.:   Pt. worked on the [employer] for 8 min. with constant monitoring of the BUEs. Pt. worked on changing, and alternating forward for 6 min, and reverse position for 2 min. Rest breaks were required.      PATIENT EDUCATION: Education details: UE strengthening, FMC, functional reach Person educated: Patient Education method: verbal cues Education comprehension: verbalized understanding    HOME EXERCISE PROGRAM:  Reviewed home activities to enhance left hand digit extension during typing skills.   GOALS: Goals reviewed with patient? Yes     SHORT TERM GOALS: Target date: 07/21/23   Pt. Will improve FOTO score by 2 points to reflect improved pt. perceived functional performance Baseline: 07/28/2023: FOTO score: 62, 06/16/2023: FOTO score: 70 06/02/23: 65; 04/21/2023: 49 03/10/2023: 59 01/13/2023: 49 12/01/2022: FOTO: 51 10/23/2022: FOTO 49 10/09/2022: FOTO 49, FOTO: 61 TR score: 62 Goal status: achieved/ongoing   LONG TERM GOALS: Target date: 08/25/2023   Pt. Will improve left shoulder ROM by 10 degrees  to be able able to efficiently apply deodorant Baseline: 07/28/2023: shoulder flexion 83(108), abd 80(110)  06/16/23: shoulder flexion 80 (108), abd 76(106) Pt. Is able to apply deodorant to the left, however takes increased time.; 06/02/23: shoulder flexion 77 (104), abd 70 (100); 04/21/2023: shoulder flexion: 40(87), Abduction: 48(76) 03/10/2023: Shoulder flexion: 35(85) 01/13/2023:  Shoulder flexion: 35(82), Abduction: 47(70) 12/02/2022: Shoulder flexion: 32(82), Abduction: 45(60)  10/23/2022: Shoulder flexion: 26(80), Abduction: 32(50) 10/09/2022: flexion: 0(55), Abduction: 32(54) Goal status: Improving, ongoing   2.  Pt. will be able to independently hold and use a blow dryer, and brush for hair  care. Baseline: 07/28/2023: Pt can hold a blow dryer, and is able to move the blow dryer within her hand, however reaches only to L side of head and lower part of back of head with brush and blow dryer d/t limited ER ROM and strength11/26/2024: shoulder flexion 80 (108), abd 76(106) 06/02/23: Pt can hold a blow dryer but reaches only to L side of head and lower part of back of head with brush and blow dryer d/t limited ER ROM and strength; 04/21/2023: Pt. is able to hold the hair dryer in the left hand, however is unable to reach up to dry it, and has difficulty using a brush with the left UE. 03/10/2023: Pt. continues to be able to hold the hair dryer in the left hand, however is unable to reach up to dry it, and has difficulty using a brush 01/13/2023: Pt. Can hold the hair dryer in the left hand, however is unable to reach up to dry it, and has difficulty using a brush. 12/02/2022: Pt. is unable to actively elevate he left shoulder in order to blow dry her hair. 10/23/2022: Pt. continues to be unable to hold a blow dryer. Eval: Pt. Is unable to sustain her BUEs in elevation, and use a blow dryer, and brush. 10/09/2022: Pt. Is unable to hold a blow dryer. Eval: Pt. Is unable to sustain her BUEs in elevation, and use a blow  dryer, and brush. Goal status: improving/ongoing   3.  Pt. Will improve left lateral pinch strength by 3# to be able to independently cut meat. Baseline:07/28/2023:  Lateral pinch: Left: 7 lbs.06/16/2023: Lateral pinch: Left: 5 lbs. Pt. Has difficulty cutting steak, however is able to cut tender pieces of chicken. 06/02/23: L lateral pinch: 6# (pt reports she lacks the strength to cut steak but can cut tender chicken); 04/21/2023: NT(Pinch Meter sent out for calibration) Pt. continues to have difficulty cutting meat. 03/10/2023: Left lateral pinch strength: 9#   01/13/2023: Left lateral pinch strength: 9#  12/02/2022: Left pinch: 7# Pt. has difficulty cutting meat. 10/23/2022: Pt. Continues to present with difficulty cutting meat. 10/09/2022: 1#  Eval: 2#. Pt. has difficulty stabilizing utensil, and food while cutting food. Goal status: Ongoing   4.  Pt. Will improve Left hand [facility] skills to be able to be able to independently, and efficiently manipulate small objects during ADL tasks. Baseline: 1/0/2025: Left FMC skills: 44 sec. 06/16/2023: Left 45 sec. 06/02/2023: L 47 sec; 04/21/2023:  46 sec. 03/10/2023: 54 sec. 01/13/2023: 51 sec. 12/02/2022: TBD  10/23/2022: Pt. Continues to present with difficulty manipulating small objects. 10/09/2022: Left: 1 min & 35 sec.: Eval: Left: 52 sec. Right: 27 sec. Goal status: Improving, Ongoing    6.: Pt. will improve with left grip strength to be able to hold and pull up pants Baseline: 07/28/2023: Left grip strength: 20# 06/16/2023: Left grip strength: 19# Pt. Is now able to pull her pants up.06/02/23: L grip 19#, improving; 04/21/2023: Pt. is able to pull up covers, however has difficulty pulling up pants. 03/10/2023: left  grip strength: 15# 01/13/2023: left  grip strength: 15# 12/02/2022: 17# 10/23/2022: Left: 16# 10/09/2022: Left: 10# Eval:  Left grip strength 11#. Pt. has difficulty holding a full drink  securely with the left hand.             Goal status: Improved   7. Pt.  will improve typing speed, and accuracy in preparation for efficiently typing simple email message. Baseline: 07/28/2023: Pt. now has a working laptop  available for her to use. 06/16/2023:  Limited ability to carry-over practice at home due to Pt.'s mouse at home is not working. Pt. Also can not see the keys on her computer at home. 06/02/23: same as 04/21/23; 04/21/2023:  Pt. continues to present with limited speed, and accuracy with typing  03/10/2023: Pt. continues to present with limited speed, and accuracy with typing 01/13/2023: TBD 12/02/2022:  Pt. continues to present with limited speed, and accuracy with typing.10/23/2022: Pt. continues to present with limited speed, and accuracy with typing. 10/09/2022: Pt. presents with difficulty typing. Pt. presents with difficulty typing an email. Typing speed, and accuracy TBD             Goal status:  Resume goal                                CLINICAL IMPRESSION:  Pt. is making progress overall with the LUE shoulder ROM, LUE strength, grip strength, pinch strength, and FMC skills. Pt. is now engaging her left hand more during daily ADL, and IADL tasks. Pt. continues to present with limited UE strength, and [facility] skills which limits her ability to be able to perform hair care, efficiently manipulate small objects, reach for items on elevated surfaces, and efficiently type email correspondence. FOTO score is 62. Pt. continues to benefit from OT services to work towards improving Left UE functioning in order to maximize engagement in, and overall independence with ADLs, and IADL tasks, while reducing risk of adhesive capsulitis.     PERFORMANCE DEFICITS in functional skills including ADLs, IADLs, coordination, dexterity, sensation, ROM, strength, FMC, decreased knowledge of use of DME, vision, and UE functional use, cognitive skills including attention, and psychosocial skills including coping strategies, environmental adaptation, habits, and routines and behaviors.     IMPAIRMENTS are limiting patient from ADLs, IADLs, and social participation.    COMORBIDITIES may have co-morbidities  that affects occupational performance. Patient will benefit from skilled OT to address above impairments and improve overall function.   MODIFICATION OR ASSISTANCE TO COMPLETE EVALUATION: Min-Moderate modification of tasks or assist with assess necessary to complete an evaluation.   OT OCCUPATIONAL PROFILE AND HISTORY: Detailed assessment: Review of records and additional review of physical, cognitive, psychosocial history related to current functional performance.   CLINICAL DECISION MAKING: Moderate - several treatment options, min-mod task modification necessary   REHAB POTENTIAL: Good   EVALUATION COMPLEXITY: Moderate      PLAN: OT FREQUENCY: 2x/week   OT DURATION: 12 weeks   PLANNED INTERVENTIONS: self care/ADL training, therapeutic exercise, therapeutic activity, neuromuscular re-education, manual therapy, passive range of motion, and paraffin   RECOMMENDED OTHER SERVICES:    CONSULTED AND AGREED WITH PLAN OF CARE: Patient   PLAN FOR NEXT SESSION:  Left hand strengthening and coordination exercises  Richardson Otter, MS, OTR/L    07/28/23, 11:55 AM

## 2023-07-30 ENCOUNTER — Ambulatory Visit: Payer: Medicare HMO | Admitting: Occupational Therapy

## 2023-07-30 ENCOUNTER — Other Ambulatory Visit (HOSPITAL_COMMUNITY): Payer: Self-pay

## 2023-07-30 MED ORDER — APIXABAN 5 MG PO TABS: 5.0000 mg | ORAL_TABLET | Freq: Two times a day (BID) | ORAL | 9 refills | Status: AC

## 2023-08-04 ENCOUNTER — Ambulatory Visit: Payer: Medicare HMO | Admitting: Occupational Therapy

## 2023-08-04 DIAGNOSIS — M6281 Muscle weakness (generalized): Secondary | ICD-10-CM | POA: Diagnosis not present

## 2023-08-04 DIAGNOSIS — R278 Other lack of coordination: Secondary | ICD-10-CM

## 2023-08-04 NOTE — Therapy (Addendum)
 OCCUPATIONAL THERAPY TREATMENT NOTE   Patient Name: Alyssa Mayo MRN: [id number] DOB:1945-12-26, 78 y.o., female Today's Date: 03/14/2022   PCP: Lynwood Null, MD REFERRING PROVIDER: Harlene Bogaert, NP     OT End of Session - 07/28/23 1154     Visit Number 51   Date for OT Re-Evaluation 08/25/23    Authorization Type Progress reporting period starting 06/16/2023    OT Start Time 1205   OT Stop Time 1230   OT Time Calculation (min) 25 min    Activity Tolerance Patient tolerated treatment well    Behavior During Therapy [facility] for tasks assessed/performed                     Past Medical History:  Diagnosis Date   Abnormal levels of other serum enzymes 07/17/2015   Arthritis     Constipation 07/11/2015   COVID-19 03/06/2021   Diabetes mellitus without complication (HCC)     Elevated liver enzymes     Fatty liver     Generalized abdominal pain 07/11/2015   Hyperlipidemia     Hypertension     Lifelong obesity     Macular degeneration     Morbid obesity due to excess calories (HCC) 07/11/2015   Other fatigue 07/11/2015   Sleep apnea     Spinal headache           Past Surgical History:  Procedure Laterality Date   ABDOMINAL HYSTERECTOMY       APPENDECTOMY       CATARACT EXTRACTION       CERVICAL LAMINECTOMY   07/21/1985   CESAREAN SECTION       COLONOSCOPY   07/21/2012    diverticulosis, [facility] Dr. Luellen    COLONOSCOPY WITH PROPOFOL  N/A 02/04/2019    Procedure: COLONOSCOPY WITH PROPOFOL ;  Surgeon: Gaylyn Gladis PENNER, MD;  Location: [facility] ENDOSCOPY;  Service: Endoscopy;  Laterality: N/A;   COLONOSCOPY WITH PROPOFOL  N/A 03/18/2021    Procedure: COLONOSCOPY WITH PROPOFOL ;  Surgeon: Maryruth Ole DASEN, MD;  Location: [facility] ENDOSCOPY;  Service: Endoscopy;  Laterality: N/A;  DM   DIAGNOSTIC LAPAROSCOPY       ESOPHAGOGASTRODUODENOSCOPY (EGD) WITH PROPOFOL  N/A 02/04/2019    Procedure: ESOPHAGOGASTRODUODENOSCOPY (EGD) WITH PROPOFOL ;  Surgeon: Gaylyn Gladis PENNER, MD;   Location: [facility] ENDOSCOPY;  Service: Endoscopy;  Laterality: N/A;   ESOPHAGOGASTRODUODENOSCOPY (EGD) WITH PROPOFOL  N/A 03/18/2021    Procedure: ESOPHAGOGASTRODUODENOSCOPY (EGD) WITH PROPOFOL ;  Surgeon: Maryruth Ole DASEN, MD;  Location: [facility] ENDOSCOPY;  Service: Endoscopy;  Laterality: N/A;   LOOP RECORDER INSERTION N/A 08/05/2021    Procedure: LOOP RECORDER INSERTION;  Surgeon: Waddell Danelle ORN, MD;  Location: [facility] INVASIVE CV LAB;  Service: Cardiovascular;  Laterality: N/A;   SPINE SURGERY       TUBAL LIGATION            Patient Active Problem List    Diagnosis Date Noted   Paroxysmal atrial fibrillation (HCC) 08/08/2022   Hypercoagulable state due to paroxysmal atrial fibrillation (HCC) 08/08/2022   Cerebral edema (HCC) 08/07/2021   OSA (obstructive sleep apnea) 08/07/2021   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   CVA (cerebral vascular accident) (HCC) 08/01/2021   GERD (gastroesophageal reflux disease) 07/20/2019   Advanced care planning/counseling discussion 12/17/2016   Skin lesions, generalized 11/13/2016   Chronic right hip pain 06/17/2016   Vitamin D  deficiency 09/26/2015   Diastasis recti 08/08/2015   Elevated serum GGT level 07/24/2015   Essential hypertension 07/17/2015   Fatty liver  07/17/2015   Elevated alkaline phosphatase level 07/17/2015   Elevated serum glutamic pyruvic transaminase (SGPT) level 07/17/2015   Abnormal finding on EKG 07/11/2015   Morbid obesity (HCC) 07/11/2015   Colon, diverticulosis 07/11/2015   Abdominal wall hernia 07/11/2015   DM (diabetes mellitus), type 2 with neurological complications (HCC)     Hyperlipidemia      ONSET DATE: 08/01/2021   REFERRING DIAG: CVA   THERAPY DIAG:  Muscle weakness (generalized)   Rationale for Evaluation and Treatment Rehabilitation   SUBJECTIVE:   SUBJECTIVE STATEMENT:       Pt. reports doing well today, and is feeling better.  Pt accompanied by: self   PERTINENT HISTORY:  Pt. is a 78  y.o. female who had a unexpectedly hospitalized from 2/12-2/14/2024 for COVID-19, and a UTI.  Pt. 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 Pt. attended inpatient rehab from 08/07/2021-08/22/2021. Pt. received Home health therapy services this past spring.  Pt. has recently had an assessment through driver rehabilitation services at [facility] with recommendations for referrals for outpatient OT/PT, and ST services. Pt. PMHx includes: HTN, Hyperlipidemia, Macular degeneration, DM, and Obesity.   PRECAUTIONS: None   WEIGHT BEARING RESTRICTIONS No   PAIN: Are you having pain? No reports of pain  FALLS: Has patient fallen in last 6 months? Yes.   LIVING ENVIRONMENT: Lives with: lives with their family  Son  [name] Lives in: House/apartment Main living are on one floor Stairs:  2 steps to enter Has following equipment at home: Single point cane, Wheelchair (manual), shower chair, Grab bars, bed rail, and rubber mat.   PLOF: Independent   PATIENT GOALS: To be able to Drive, and cook again   OBJECTIVE:    HAND DOMINANCE: Right    ADLs:  Transfers/ambulation related to ADLs: Independent Eating: Independent Grooming: [employer] Pt. reports being unable to use her left hand to perform haircare.  UB Dressing: Independent pullover shirt, independent now with fastening a bra LB Dressing: Independent donning pants socks, and slide on shoes Toileting: Independent Bathing: Independent Tub Shower transfers: Walk-in shower Independent now    IADLs: Shopping: Needs to be accompanied to the grocery store. Light housekeeping: Do  Meal Prep:  Is able to perform light meal prep Community mobility: Relies on son, and friend Medication management: Son sets up Mining Engineer management:TBD Handwriting: TBD   MOBILITY STATUS: Hx of falls out of bed     ACTIVITY TOLERANCE: Activity tolerance:  10-20 min. Before rest break   FUNCTIONAL OUTCOME  MEASURES: FOTO: 61  TR score: 62  10/09/2022: FOTO: 49  (Recert) 06/02/23: 65  07/28/2023:  FOTO score: 62   UPPER EXTREMITY ROM      Active ROM Right eval Left eval Left  10/09/2022 Left 10/23/22 Left 12/02/2022 Left 01/13/2023 Left  03/10/2023 Left 04/21/23 Left 05/12/23 Left 06/02/23 Left 06/16/23 Left 07/28/23  Shoulder flexion WFL 54(74) scaption 0(55) 26(80) 32(82) 35(82) 35(85) 40(87) 70(96) 77 (104) 80(108) 83(108)  Shoulder abduction WFL 58(75) 32(54) 32(50) 45(60) 47(70) 62(72) 48(76) 73(102) 70 (100) 76(106) 80(110)  Shoulder adduction                Shoulder extension                Shoulder internal rotation            Thumb to L3 (T10) Thumb to L3 (T10) Thumb L3 (T10)  Shoulder external rotation            (  With elbow at side) 45 (60) (With elbow at side) 47 (60) (With elbow at side) 47  Elbow flexion [facility] WFL 145 145 145 145 145 147  WFL WFL WFL  Elbow extension [facility] WFL 10(0) 10(0) WFL WFL [facility] WFL  Wrist flexion WFL TBD [facility]  [facility] WFL WFL  Wrist extension WFL TBD [facility] WFL [facility]  [facility] WFL WFL  Wrist ulnar deviation                Wrist radial deviation                Wrist pronation             [facility] WFL  Wrist supination             WFL WFL  (Blank rows = not tested)     UPPER EXTREMITY MMT:      MMT Right eval Left eval Left 10/09/2022 Left 10/23/2022 Left 12/02/2022 Left 01/13/2023 Left 03/10/2023 Left 04/21/23 Left 06/02/23 Left 06/16/23 Left 07/28/23  Shoulder flexion 4+/5 2+/5 1/5 2-/5 2-/5 2-/5 2-/5 2-/5 2- 2+/5 3-/5  Shoulder abduction 4+/5 2+/5 2-/5 2-/5 2-/5 2-/5 2-/5 2-/5 2- 2+/5 3-/5  Shoulder adduction               Shoulder extension               Shoulder internal rotation           4- 4-/5   Shoulder external rotation           3- 3-/5   Middle trapezius               Lower trapezius               Elbow flexion 4+/5 3+/5 3/5 3/5 3+/5 3+/5 4/5 4/5 4+/5 4+/5 4+/5  Elbow extension 4+/5 3+/5 3/5 3/5 3+/5  3+/5 4/5 4/5 4/5 4+/5 4+/5  Wrist flexion 4+/5 TBD 3/5 3/5 3+/5 3+/5 4/5 4/5 4+/5 4+/5 4+/5  Wrist extension 4+/5 TBD 3/5 3/5 3+/5 3+/5 4/5 4/5 4/5 4/5 4+/5  Wrist ulnar deviation               Wrist radial deviation               Wrist pronation               Wrist supination               (Blank rows = not tested)   HAND FUNCTION: Grip strength: Right: 33 lbs; Left: 11 lbs, Lateral pinch: Right: 17 lbs, Left: 2 lbs, and 3 point pinch: Right: 17 lbs, Left: 2 lbs  10/09/2022: Grip strength: Right: 33 lbs; Left: 10 lbs, Lateral pinch: Right: 17 lbs, Left: 1 lbs, and 3 point pinch: Right: 17 lbs, Left: 1 lbs   10/23/2022: Grip strength: Right: 33 lbs; Left: 16 lbs, Lateral pinch: Right: 17 lbs, Left: 1 lbs, and 3 point pinch: Right: 17 lbs, Left: 1 lbs  12/02/2022: Grip strength: Right: 33 lbs; Left: 17 lbs, Lateral pinch: Right: 17 lbs, Left: 7 lbs, and 3 point pinch: Right: 17 lbs, Left: 3 lbs   01/13/2023: Grip strength: Right: 33 lbs; Left: 15 lbs, Lateral pinch: Right: 17 lbs, Left: 9 lbs, and 3 point pinch: Right: 17 lbs, Left: 7 lbs  03/10/2023: Grip strength: Right: 33 lbs; Left: 15 lbs, Lateral pinch: Right:  17 lbs, Left: 9 lbs, and 3 point pinch: Right: 17 lbs, Left: 7 lbs   04/21/2023: Grip strength: Right: 34 lbs; Left: 13 lbs, Lateral pinch: NT 3 point pinch: Right: NT  06/02/23: Grip strength: Right: 34 lbs; Left: 19 lbs, Lateral pinch: Right: 16 lbs; Left: 6 lbs; 3 point pinch: Right: 13 lbs, Left: 7 lbs   06/16/2023: Grip strength: Right: 39 lbs; Left: 19 lbs, Lateral pinch: Right: 16 lbs; Left: 5 lbs; 3 point pinch: Right: 13 lbs, Left: 9 lbs   07/28/2023: Grip strength: Right: 39 lbs; Left: 20 lbs, Lateral pinch: Right: 16 lbs; Left: 7 lbs; 3 point pinch: Right: 13 lbs, Left: 9 lbs         COORDINATION:   9 Hole Peg test: Right: 27 sec.  sec; Left: 52 sec.  10/09/2022: 9 Hole Peg test: Right: 27 sec.  sec; Left: 1 min. & 35 sec.  01/13/2023: 9 Hole Peg test: Right: 27  sec.  sec; Left: 51. Sec.  03/10/2023: 9 Hole Peg test: Right: 27 sec.  sec; Left: 54 Sec.  04/21/2023: 9 Hole Peg test: Right: 27 sec.  sec; Left: 46 Sec.  06/02/23: 9 hole Peg test: Left: 47 sec  06/15/23: 9 hole Peg test: Left: 45 sec  07/28/2023: 9 hole Peg test: Left: 44 sec   SENSATION: Light touch: WFL Proprioception: WFL   COGNITION: Overall cognitive status: Within functional limits for tasks assessed   VISION: Subjective report: Glasses. Just received a new prescription to increase bifocal strength Baseline vision: Macular Degeneration Visual history: macular degeneration   VISION ASSESSMENT: To be further assessed in functional context  Left sided Awareness     PERCEPTION: Limited left sided awareness   TODAY'S TREATMENT:   There. Ex.:   Pt. worked on the [employer] for 8 min. with constant monitoring of the BUEs. Pt. worked on changing, and alternating forward for 6 min, and reverse position for 2 min. Rest breaks were required.  Pt. tolerated AROM/AAROM/PROM to the tolerated end range for each joint of the LUE.    PATIENT EDUCATION: Education details: UE strengthening, FMC, functional reach Person educated: Patient Education method: verbal cues Education comprehension: verbalized understanding    HOME EXERCISE PROGRAM:  Reviewed home activities to enhance left hand digit extension during typing skills.   GOALS: Goals reviewed with patient? Yes     SHORT TERM GOALS: Target date: 07/21/23   Pt. Will improve FOTO score by 2 points to reflect improved pt. perceived functional performance Baseline: 07/28/2023: FOTO score: 62, 06/16/2023: FOTO score: 70 06/02/23: 65; 04/21/2023: 49 03/10/2023: 59 01/13/2023: 49 12/01/2022: FOTO: 51 10/23/2022: FOTO 49 10/09/2022: FOTO 49, FOTO: 61 TR score: 62 Goal status: achieved/ongoing   LONG TERM GOALS: Target date: 08/25/2023   Pt. Will improve left shoulder ROM by 10 degrees to be able able to efficiently apply  deodorant Baseline: 07/28/2023: shoulder flexion 83(108), abd 80(110)  06/16/23: shoulder flexion 80 (108), abd 76(106) Pt. Is able to apply deodorant to the left, however takes increased time.; 06/02/23: shoulder flexion 77 (104), abd 70 (100); 04/21/2023: shoulder flexion: 40(87), Abduction: 48(76) 03/10/2023: Shoulder flexion: 35(85) 01/13/2023:  Shoulder flexion: 35(82), Abduction: 47(70) 12/02/2022: Shoulder flexion: 32(82), Abduction: 45(60)  10/23/2022: Shoulder flexion: 26(80), Abduction: 32(50) 10/09/2022: flexion: 0(55), Abduction: 32(54) Goal status: Improving, ongoing   2.  Pt. will be able to independently hold and use a blow dryer, and brush for hair care. Baseline: 07/28/2023: Pt can hold a blow dryer, and is able to  move the blow dryer within her hand, however reaches only to L side of head and lower part of back of head with brush and blow dryer d/t limited ER ROM and strength11/26/2024: shoulder flexion 80 (108), abd 76(106) 06/02/23: Pt can hold a blow dryer but reaches only to L side of head and lower part of back of head with brush and blow dryer d/t limited ER ROM and strength; 04/21/2023: Pt. is able to hold the hair dryer in the left hand, however is unable to reach up to dry it, and has difficulty using a brush with the left UE. 03/10/2023: Pt. continues to be able to hold the hair dryer in the left hand, however is unable to reach up to dry it, and has difficulty using a brush 01/13/2023: Pt. Can hold the hair dryer in the left hand, however is unable to reach up to dry it, and has difficulty using a brush. 12/02/2022: Pt. is unable to actively elevate he left shoulder in order to blow dry her hair. 10/23/2022: Pt. continues to be unable to hold a blow dryer. Eval: Pt. Is unable to sustain her BUEs in elevation, and use a blow dryer, and brush. 10/09/2022: Pt. Is unable to hold a blow dryer. Eval: Pt. Is unable to sustain her BUEs in elevation, and use a blow dryer, and brush. Goal status:  improving/ongoing   3.  Pt. Will improve left lateral pinch strength by 3# to be able to independently cut meat. Baseline:07/28/2023:  Lateral pinch: Left: 7 lbs.06/16/2023: Lateral pinch: Left: 5 lbs. Pt. Has difficulty cutting steak, however is able to cut tender pieces of chicken. 06/02/23: L lateral pinch: 6# (pt reports she lacks the strength to cut steak but can cut tender chicken); 04/21/2023: NT(Pinch Meter sent out for calibration) Pt. continues to have difficulty cutting meat. 03/10/2023: Left lateral pinch strength: 9#   01/13/2023: Left lateral pinch strength: 9#  12/02/2022: Left pinch: 7# Pt. has difficulty cutting meat. 10/23/2022: Pt. Continues to present with difficulty cutting meat. 10/09/2022: 1#  Eval: 2#. Pt. has difficulty stabilizing utensil, and food while cutting food. Goal status: Ongoing   4.  Pt. Will improve Left hand [facility] skills to be able to be able to independently, and efficiently manipulate small objects during ADL tasks. Baseline: 1/0/2025: Left FMC skills: 44 sec. 06/16/2023: Left 45 sec. 06/02/2023: L 47 sec; 04/21/2023:  46 sec. 03/10/2023: 54 sec. 01/13/2023: 51 sec. 12/02/2022: TBD  10/23/2022: Pt. Continues to present with difficulty manipulating small objects. 10/09/2022: Left: 1 min & 35 sec.: Eval: Left: 52 sec. Right: 27 sec. Goal status: Improving, Ongoing    6.: Pt. will improve with left grip strength to be able to hold and pull up pants Baseline: 07/28/2023: Left grip strength: 20# 06/16/2023: Left grip strength: 19# Pt. Is now able to pull her pants up.06/02/23: L grip 19#, improving; 04/21/2023: Pt. is able to pull up covers, however has difficulty pulling up pants. 03/10/2023: left  grip strength: 15# 01/13/2023: left  grip strength: 15# 12/02/2022: 17# 10/23/2022: Left: 16# 10/09/2022: Left: 10# Eval:  Left grip strength 11#. Pt. has difficulty holding a full drink  securely with the left hand.             Goal status: Improved   7. Pt. will improve typing speed, and  accuracy in preparation for efficiently typing simple email message. Baseline: 07/28/2023: Pt. now has a working laptop available for her to use. 06/16/2023:  Limited ability to carry-over practice at  home due to Pt.'s mouse at home is not working. Pt. Also can not see the keys on her computer at home. 06/02/23: same as 04/21/23; 04/21/2023:  Pt. continues to present with limited speed, and accuracy with typing  03/10/2023: Pt. continues to present with limited speed, and accuracy with typing 01/13/2023: TBD 12/02/2022:  Pt. continues to present with limited speed, and accuracy with typing.10/23/2022: Pt. continues to present with limited speed, and accuracy with typing. 10/09/2022: Pt. presents with difficulty typing. Pt. presents with difficulty typing an email. Typing speed, and accuracy TBD             Goal status:  Resume goal                                CLINICAL IMPRESSION:  Pt. was late for the session this morning due to traffic. Pt. continues to make progress overall with the LUE shoulder ROM, LUE strength, grip strength, pinch strength, and FMC skills. Pt. was able to tolerate the exercises well today. Pt. continues to present with limited LUE ROM. Pt. continues to benefit from OT services to work towards improving Left UE functioning in order to maximize engagement in, and overall independence with ADLs, and IADL tasks, while reducing risk of adhesive capsulitis.     PERFORMANCE DEFICITS in functional skills including ADLs, IADLs, coordination, dexterity, sensation, ROM, strength, FMC, decreased knowledge of use of DME, vision, and UE functional use, cognitive skills including attention, and psychosocial skills including coping strategies, environmental adaptation, habits, and routines and behaviors.    IMPAIRMENTS are limiting patient from ADLs, IADLs, and social participation.    COMORBIDITIES may have co-morbidities  that affects occupational performance. Patient will benefit from skilled OT  to address above impairments and improve overall function.   MODIFICATION OR ASSISTANCE TO COMPLETE EVALUATION: Min-Moderate modification of tasks or assist with assess necessary to complete an evaluation.   OT OCCUPATIONAL PROFILE AND HISTORY: Detailed assessment: Review of records and additional review of physical, cognitive, psychosocial history related to current functional performance.   CLINICAL DECISION MAKING: Moderate - several treatment options, min-mod task modification necessary   REHAB POTENTIAL: Good   EVALUATION COMPLEXITY: Moderate      PLAN: OT FREQUENCY: 2x/week   OT DURATION: 12 weeks   PLANNED INTERVENTIONS: self care/ADL training, therapeutic exercise, therapeutic activity, neuromuscular re-education, manual therapy, passive range of motion, and paraffin   RECOMMENDED OTHER SERVICES:    CONSULTED AND AGREED WITH PLAN OF CARE: Patient   PLAN FOR NEXT SESSION:  Left hand strengthening and coordination exercises  Richardson Otter, MS, OTR/L    08/04/23, 2:13 PM

## 2023-08-06 ENCOUNTER — Ambulatory Visit: Payer: Medicare HMO | Admitting: Occupational Therapy

## 2023-08-06 DIAGNOSIS — M6281 Muscle weakness (generalized): Secondary | ICD-10-CM | POA: Diagnosis not present

## 2023-08-06 DIAGNOSIS — R278 Other lack of coordination: Secondary | ICD-10-CM

## 2023-08-06 NOTE — Therapy (Signed)
OCCUPATIONAL THERAPY TREATMENT NOTE   Patient Name: Alyssa Mayo MRN: [id number] DOB:10-Jan-1946, 78 y.o., female Today's Date: 03/14/2022   PCP: [name], MD REFERRING PROVIDER: [name], NP     OT End of Session - 08/06/23 1058     Visit Number 52    Number of Visits 81    Date for OT Re-Evaluation 08/25/23    Authorization Type Progress reporting period starting 06/16/2023    OT Start Time 1017    OT Stop Time 1100    OT Time Calculation (min) 43 min    Activity Tolerance Patient tolerated treatment well    Behavior During Therapy [facility] for tasks assessed/performed                         Past Medical History:  Diagnosis Date   Abnormal levels of other serum enzymes 07/17/2015   Arthritis     Constipation 07/11/2015   COVID-19 03/06/2021   Diabetes mellitus without complication (HCC)     Elevated liver enzymes     Fatty liver     Generalized abdominal pain 07/11/2015   Hyperlipidemia     Hypertension     Lifelong obesity     Macular degeneration     Morbid obesity due to excess calories (HCC) 07/11/2015   Other fatigue 07/11/2015   Sleep apnea     Spinal headache           Past Surgical History:  Procedure Laterality Date   ABDOMINAL HYSTERECTOMY       APPENDECTOMY       CATARACT EXTRACTION       CERVICAL LAMINECTOMY   07/21/1985   CESAREAN SECTION       COLONOSCOPY   07/21/2012    diverticulosis, [facility] Dr. [name]    COLONOSCOPY WITH PROPOFOL N/A 02/04/2019    Procedure: COLONOSCOPY WITH PROPOFOL;  Surgeon: [name], MD;  Location: [facility] ENDOSCOPY;  Service: Endoscopy;  Laterality: N/A;   COLONOSCOPY WITH PROPOFOL N/A 03/18/2021    Procedure: COLONOSCOPY WITH PROPOFOL;  Surgeon: [name], MD;  Location: [facility] ENDOSCOPY;  Service: Endoscopy;  Laterality: N/A;  DM   DIAGNOSTIC LAPAROSCOPY       ESOPHAGOGASTRODUODENOSCOPY (EGD) WITH PROPOFOL N/A 02/04/2019    Procedure: ESOPHAGOGASTRODUODENOSCOPY (EGD) WITH PROPOFOL;   Surgeon: [name], MD;  Location: [facility] ENDOSCOPY;  Service: Endoscopy;  Laterality: N/A;   ESOPHAGOGASTRODUODENOSCOPY (EGD) WITH PROPOFOL N/A 03/18/2021    Procedure: ESOPHAGOGASTRODUODENOSCOPY (EGD) WITH PROPOFOL;  Surgeon: [name], MD;  Location: [facility] ENDOSCOPY;  Service: Endoscopy;  Laterality: N/A;   LOOP RECORDER INSERTION N/A 08/05/2021    Procedure: LOOP RECORDER INSERTION;  Surgeon: [name], MD;  Location: [facility] INVASIVE CV LAB;  Service: Cardiovascular;  Laterality: N/A;   SPINE SURGERY       TUBAL LIGATION            Patient Active Problem List    Diagnosis Date Noted   Paroxysmal atrial fibrillation (HCC) 08/08/2022   Hypercoagulable state due to paroxysmal atrial fibrillation (HCC) 08/08/2022   Cerebral edema (HCC) 08/07/2021   OSA (obstructive sleep apnea) 08/07/2021   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   CVA (cerebral vascular accident) (HCC) 08/01/2021   GERD (gastroesophageal reflux disease) 07/20/2019   Advanced care planning/counseling discussion 12/17/2016   Skin lesions, generalized 11/13/2016   Chronic right hip pain 06/17/2016   Vitamin D deficiency 09/26/2015   Diastasis recti 08/08/2015  Elevated serum GGT level 07/24/2015   Essential hypertension 07/17/2015   Fatty liver 07/17/2015   Elevated alkaline phosphatase level 07/17/2015   Elevated serum glutamic pyruvic transaminase (SGPT) level 07/17/2015   Abnormal finding on EKG 07/11/2015   Morbid obesity (HCC) 07/11/2015   Colon, diverticulosis 07/11/2015   Abdominal wall hernia 07/11/2015   DM (diabetes mellitus), type 2 with neurological complications (HCC)     Hyperlipidemia      ONSET DATE: 08/01/2021   REFERRING DIAG: CVA   THERAPY DIAG:  Muscle weakness (generalized)   Rationale for Evaluation and Treatment Rehabilitation   SUBJECTIVE:   SUBJECTIVE STATEMENT:       Pt. reports doing well today, and is feeling better.  Pt accompanied by: self    PERTINENT HISTORY:  Pt. is a 78 y.o. female who had a unexpectedly hospitalized from 2/12-2/14/2024 for COVID-19, and a UTI.  Pt. 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 Pt. attended inpatient rehab from 08/07/2021-08/22/2021. Pt. received Home health therapy services this past spring.  Pt. has recently had an assessment through driver rehabilitation services at [facility] with recommendations for referrals for outpatient OT/PT, and ST services. Pt. PMHx includes: HTN, Hyperlipidemia, Macular degeneration, DM, and Obesity.   PRECAUTIONS: None   WEIGHT BEARING RESTRICTIONS No   PAIN: Are you having pain? No reports of pain  FALLS: Has patient fallen in last 6 months? Yes.   LIVING ENVIRONMENT: Lives with: lives with their family  Son  [name] Lives in: House/apartment Main living are on one floor Stairs:  2 steps to enter Has following equipment at home: Single point cane, Wheelchair (manual), shower chair, Grab bars, bed rail, and rubber mat.   PLOF: Independent   PATIENT GOALS: To be able to Drive, and cook again   OBJECTIVE:    HAND DOMINANCE: Right    ADLs:  Transfers/ambulation related to ADLs: Independent Eating: Independent Grooming: [employer] Pt. reports being unable to use her left hand to perform haircare.  UB Dressing: Independent pullover shirt, independent now with fastening a bra LB Dressing: Independent donning pants socks, and slide on shoes Toileting: Independent Bathing: Independent Tub Shower transfers: Walk-in shower Independent now    IADLs: Shopping: Needs to be accompanied to the grocery store. Light housekeeping: Do  Meal Prep:  Is able to perform light meal prep Community mobility: Relies on son, and friend Medication management: Son sets up Mining engineer management:TBD Handwriting: TBD   MOBILITY STATUS: Hx of falls out of bed     ACTIVITY TOLERANCE: Activity tolerance:  10-20 min. Before  rest break   FUNCTIONAL OUTCOME MEASURES: FOTO: 61  TR score: 62  10/09/2022: FOTO: 49  (Recert) 06/02/23: 65  07/28/2023:  FOTO score: 62   UPPER EXTREMITY ROM      Active ROM Right eval Left eval Left  10/09/2022 Left 10/23/22 Left 12/02/2022 Left 01/13/2023 Left  03/10/2023 Left 04/21/23 Left 05/12/23 Left 06/02/23 Left 06/16/23 Left 07/28/23  Shoulder flexion WFL 54(74) scaption 0(55) 26(80) 32(82) 35(82) 35(85) 40(87) 70(96) 77 (104) 80(108) 83(108)  Shoulder abduction WFL 58(75) 32(54) 32(50) 45(60) 47(70) 62(72) 48(76) 73(102) 70 (100) 76(106) 80(110)  Shoulder adduction                Shoulder extension                Shoulder internal rotation            Thumb to L3 (T10) Thumb to L3 (T10) Thumb  L3 (T10)  Shoulder external rotation            (With elbow at side) 45 (60) (With elbow at side) 47 (60) (With elbow at side) 47  Elbow flexion [facility] WFL 145 145 145 145 145 147  WFL WFL WFL  Elbow extension [facility] WFL 10(0) 10(0) WFL WFL [facility]  [facility] WFL  Wrist flexion WFL TBD [facility]  [facility] WFL WFL  Wrist extension WFL TBD [facility] WFL [facility]  [facility] WFL WFL  Wrist ulnar deviation                Wrist radial deviation                Wrist pronation             [facility] WFL  Wrist supination             WFL WFL  (Blank rows = not tested)     UPPER EXTREMITY MMT:      MMT Right eval Left eval Left 10/09/2022 Left 10/23/2022 Left 12/02/2022 Left 01/13/2023 Left 03/10/2023 Left 04/21/23 Left 06/02/23 Left 06/16/23 Left 07/28/23  Shoulder flexion 4+/5 2+/5 1/5 2-/5 2-/5 2-/5 2-/5 2-/5 2- 2+/5 3-/5  Shoulder abduction 4+/5 2+/5 2-/5 2-/5 2-/5 2-/5 2-/5 2-/5 2- 2+/5 3-/5  Shoulder adduction               Shoulder extension               Shoulder internal rotation           4- 4-/5   Shoulder external rotation           3- 3-/5   Middle trapezius               Lower trapezius               Elbow flexion 4+/5 3+/5 3/5 3/5 3+/5 3+/5 4/5 4/5 4+/5 4+/5 4+/5  Elbow  extension 4+/5 3+/5 3/5 3/5 3+/5 3+/5 4/5 4/5 4/5 4+/5 4+/5  Wrist flexion 4+/5 TBD 3/5 3/5 3+/5 3+/5 4/5 4/5 4+/5 4+/5 4+/5  Wrist extension 4+/5 TBD 3/5 3/5 3+/5 3+/5 4/5 4/5 4/5 4/5 4+/5  Wrist ulnar deviation               Wrist radial deviation               Wrist pronation               Wrist supination               (Blank rows = not tested)   HAND FUNCTION: Grip strength: Right: 33 lbs; Left: 11 lbs, Lateral pinch: Right: 17 lbs, Left: 2 lbs, and 3 point pinch: Right: 17 lbs, Left: 2 lbs  10/09/2022: Grip strength: Right: 33 lbs; Left: 10 lbs, Lateral pinch: Right: 17 lbs, Left: 1 lbs, and 3 point pinch: Right: 17 lbs, Left: 1 lbs   10/23/2022: Grip strength: Right: 33 lbs; Left: 16 lbs, Lateral pinch: Right: 17 lbs, Left: 1 lbs, and 3 point pinch: Right: 17 lbs, Left: 1 lbs  12/02/2022: Grip strength: Right: 33 lbs; Left: 17 lbs, Lateral pinch: Right: 17 lbs, Left: 7 lbs, and 3 point pinch: Right: 17 lbs, Left: 3 lbs   01/13/2023: Grip strength: Right: 33 lbs; Left: 15 lbs, Lateral pinch: Right: 17 lbs, Left: 9 lbs, and 3 point pinch: Right: 17  lbs, Left: 7 lbs  03/10/2023: Grip strength: Right: 33 lbs; Left: 15 lbs, Lateral pinch: Right: 17 lbs, Left: 9 lbs, and 3 point pinch: Right: 17 lbs, Left: 7 lbs   04/21/2023: Grip strength: Right: 34 lbs; Left: 13 lbs, Lateral pinch: NT 3 point pinch: Right: NT  06/02/23: Grip strength: Right: 34 lbs; Left: 19 lbs, Lateral pinch: Right: 16 lbs; Left: 6 lbs; 3 point pinch: Right: 13 lbs, Left: 7 lbs   06/16/2023: Grip strength: Right: 39 lbs; Left: 19 lbs, Lateral pinch: Right: 16 lbs; Left: 5 lbs; 3 point pinch: Right: 13 lbs, Left: 9 lbs   07/28/2023: Grip strength: Right: 39 lbs; Left: 20 lbs, Lateral pinch: Right: 16 lbs; Left: 7 lbs; 3 point pinch: Right: 13 lbs, Left: 9 lbs         COORDINATION:   9 Hole Peg test: Right: 27 sec.  sec; Left: 52 sec.  10/09/2022: 9 Hole Peg test: Right: 27 sec.  sec; Left: 1 min. & 35  sec.  01/13/2023: 9 Hole Peg test: Right: 27 sec.  sec; Left: 51. Sec.  03/10/2023: 9 Hole Peg test: Right: 27 sec.  sec; Left: 54 Sec.  04/21/2023: 9 Hole Peg test: Right: 27 sec.  sec; Left: 46 Sec.  06/02/23: 9 hole Peg test: Left: 47 sec  06/15/23: 9 hole Peg test: Left: 45 sec  07/28/2023: 9 hole Peg test: Left: 44 sec   SENSATION: Light touch: WFL Proprioception: WFL   COGNITION: Overall cognitive status: Within functional limits for tasks assessed   VISION: Subjective report: Glasses. Just received a new prescription to increase bifocal strength Baseline vision: Macular Degeneration Visual history: macular degeneration   VISION ASSESSMENT: To be further assessed in functional context  Left sided Awareness     PERCEPTION: Limited left sided awareness   TODAY'S TREATMENT:   There. Ex.:   Pt. tolerated AROM/AAROM/PROM for shoulder flexion, abduction. Pt. worked on the [employer] for 8 min. with constant monitoring of the BUEs. Pt. worked on changing, and alternating forward for 6 min, and reverse position for 2 min. Rest breaks were required. Pt. was able to complete 3 trials to 16 for shoulder flexion on the wall mounted vertical finger ladder. Pt. Performed bilateral shoulder flexion, chest press, and circular motion for 1 set of 10 reps each with the 1.5#.  Neuromuscular re-education:  Pt. worked on [facility] skills grasping 1" sticks. Pt. worked on storing the objects in the palm, and translatory skills moving the items from the palm of the hand to the tip of the 2nd digit, and thumb.      PATIENT EDUCATION: Education details: UE strengthening, FMC, functional reach Person educated: Patient Education method: verbal cues Education comprehension: verbalized understanding    HOME EXERCISE PROGRAM:  Reviewed home activities to enhance left hand digit extension during typing skills.   GOALS: Goals reviewed with patient? Yes     SHORT TERM GOALS: Target date: 07/21/23    Pt. Will improve FOTO score by 2 points to reflect improved pt. perceived functional performance Baseline: 07/28/2023: FOTO score: 62, 06/16/2023: FOTO score: 70 06/02/23: 65; 04/21/2023: 49 03/10/2023: 59 01/13/2023: 49 12/01/2022: FOTO: 51 10/23/2022: FOTO 49 10/09/2022: FOTO 49, FOTO: 61 TR score: 62 Goal status: achieved/ongoing   LONG TERM GOALS: Target date: 08/25/2023   Pt. Will improve left shoulder ROM by 10 degrees to be able able to efficiently apply deodorant Baseline: 07/28/2023: shoulder flexion 83(108), abd 80(110)  06/16/23: shoulder flexion 80 (108), abd 76(106) Pt.  Is able to apply deodorant to the left, however takes increased time.; 06/02/23: shoulder flexion 77 (104), abd 70 (100); 04/21/2023: shoulder flexion: 40(87), Abduction: 48(76) 03/10/2023: Shoulder flexion: 35(85) 01/13/2023:  Shoulder flexion: 35(82), Abduction: 47(70) 12/02/2022: Shoulder flexion: 32(82), Abduction: 45(60)  10/23/2022: Shoulder flexion: 26(80), Abduction: 32(50) 10/09/2022: flexion: 0(55), Abduction: 32(54) Goal status: Improving, ongoing   2.  Pt. will be able to independently hold and use a blow dryer, and brush for hair care. Baseline: 07/28/2023: Pt can hold a blow dryer, and is able to move the blow dryer within her hand, however reaches only to L side of head and lower part of back of head with brush and blow dryer d/t limited ER ROM and strength11/26/2024: shoulder flexion 80 (108), abd 76(106) 06/02/23: Pt can hold a blow dryer but reaches only to L side of head and lower part of back of head with brush and blow dryer d/t limited ER ROM and strength; 04/21/2023: Pt. is able to hold the hair dryer in the left hand, however is unable to reach up to dry it, and has difficulty using a brush with the left UE. 03/10/2023: Pt. continues to be able to hold the hair dryer in the left hand, however is unable to reach up to dry it, and has difficulty using a brush 01/13/2023: Pt. Can hold the hair dryer in the left hand,  however is unable to reach up to dry it, and has difficulty using a brush. 12/02/2022: Pt. is unable to actively elevate he left shoulder in order to blow dry her hair. 10/23/2022: Pt. continues to be unable to hold a blow dryer. Eval: Pt. Is unable to sustain her BUEs in elevation, and use a blow dryer, and brush. 10/09/2022: Pt. Is unable to hold a blow dryer. Eval: Pt. Is unable to sustain her BUEs in elevation, and use a blow dryer, and brush. Goal status: improving/ongoing   3.  Pt. Will improve left lateral pinch strength by 3# to be able to independently cut meat. Baseline:07/28/2023:  Lateral pinch: Left: 7 lbs.06/16/2023: Lateral pinch: Left: 5 lbs. Pt. Has difficulty cutting steak, however is able to cut tender pieces of chicken. 06/02/23: L lateral pinch: 6# (pt reports she lacks the strength to cut steak but can cut tender chicken); 04/21/2023: NT(Pinch Meter sent out for calibration) Pt. continues to have difficulty cutting meat. 03/10/2023: Left lateral pinch strength: 9#   01/13/2023: Left lateral pinch strength: 9#  12/02/2022: Left pinch: 7# Pt. has difficulty cutting meat. 10/23/2022: Pt. Continues to present with difficulty cutting meat. 10/09/2022: 1#  Eval: 2#. Pt. has difficulty stabilizing utensil, and food while cutting food. Goal status: Ongoing   4.  Pt. Will improve Left hand [facility] skills to be able to be able to independently, and efficiently manipulate small objects during ADL tasks. Baseline: 1/0/2025: Left FMC skills: 44 sec. 06/16/2023: Left 45 sec. 06/02/2023: L 47 sec; 04/21/2023:  46 sec. 03/10/2023: 54 sec. 01/13/2023: 51 sec. 12/02/2022: TBD  10/23/2022: Pt. Continues to present with difficulty manipulating small objects. 10/09/2022: Left: 1 min & 35 sec.: Eval: Left: 52 sec. Right: 27 sec. Goal status: Improving, Ongoing    6.: Pt. will improve with left grip strength to be able to hold and pull up pants Baseline: 07/28/2023: Left grip strength: 20# 06/16/2023: Left grip strength:  19# Pt. Is now able to pull her pants up.06/02/23: L grip 19#, improving; 04/21/2023: Pt. is able to pull up covers, however has difficulty pulling up pants. 03/10/2023:  left  grip strength: 15# 01/13/2023: left  grip strength: 15# 12/02/2022: 17# 10/23/2022: Left: 16# 10/09/2022: Left: 10# Eval:  Left grip strength 11#. Pt. has difficulty holding a full drink  securely with the left hand.             Goal status: Improved   7. Pt. will improve typing speed, and accuracy in preparation for efficiently typing simple email message. Baseline: 07/28/2023: Pt. now has a working laptop available for her to use. 06/16/2023:  Limited ability to carry-over practice at home due to Pt.'s mouse at home is not working. Pt. Also can not see the keys on her computer at home. 06/02/23: same as 04/21/23; 04/21/2023:  Pt. continues to present with limited speed, and accuracy with typing  03/10/2023: Pt. continues to present with limited speed, and accuracy with typing 01/13/2023: TBD 12/02/2022:  Pt. continues to present with limited speed, and accuracy with typing.10/23/2022: Pt. continues to present with limited speed, and accuracy with typing. 10/09/2022: Pt. presents with difficulty typing. Pt. presents with difficulty typing an email. Typing speed, and accuracy TBD             Goal status:  Resume goal                                CLINICAL IMPRESSION:  Pt.  reports that she tried to [facility] an [facility] this [facility], and had pain in the left shoulder when trying to use it. Pt. reports they tried to adjust the height, and seat, however that it did not help.Pt. reports no pain today, and was able to tolerate the exercises well without pain. Pt. continues to present with limited LUE ROM. Pt. continues to benefit from OT services to work towards improving Left UE functioning in order to maximize engagement in, and overall independence with ADLs, and IADL tasks, while reducing risk of adhesive capsulitis.     PERFORMANCE DEFICITS  in functional skills including ADLs, IADLs, coordination, dexterity, sensation, ROM, strength, FMC, decreased knowledge of use of DME, vision, and UE functional use, cognitive skills including attention, and psychosocial skills including coping strategies, environmental adaptation, habits, and routines and behaviors.    IMPAIRMENTS are limiting patient from ADLs, IADLs, and social participation.    COMORBIDITIES may have co-morbidities  that affects occupational performance. Patient will benefit from skilled OT to address above impairments and improve overall function.   MODIFICATION OR ASSISTANCE TO COMPLETE EVALUATION: Min-Moderate modification of tasks or assist with assess necessary to complete an evaluation.   OT OCCUPATIONAL PROFILE AND HISTORY: Detailed assessment: Review of records and additional review of physical, cognitive, psychosocial history related to current functional performance.   CLINICAL DECISION MAKING: Moderate - several treatment options, min-mod task modification necessary   REHAB POTENTIAL: Good   EVALUATION COMPLEXITY: Moderate      PLAN: OT FREQUENCY: 2x/week   OT DURATION: 12 weeks   PLANNED INTERVENTIONS: self care/ADL training, therapeutic exercise, therapeutic activity, neuromuscular re-education, manual therapy, passive range of motion, and paraffin   RECOMMENDED OTHER SERVICES:    CONSULTED AND AGREED WITH PLAN OF CARE: Patient   PLAN FOR NEXT SESSION:  Left hand strengthening and coordination exercises  Olegario Messier, MS, OTR/L    08/06/23, 11:10 AM

## 2023-08-11 ENCOUNTER — Ambulatory Visit: Payer: Medicare HMO | Admitting: Occupational Therapy

## 2023-08-11 DIAGNOSIS — M6281 Muscle weakness (generalized): Secondary | ICD-10-CM | POA: Diagnosis not present

## 2023-08-11 DIAGNOSIS — R278 Other lack of coordination: Secondary | ICD-10-CM

## 2023-08-11 NOTE — Therapy (Signed)
OCCUPATIONAL THERAPY TREATMENT NOTE   Patient Name: Alyssa Mayo MRN: [id number] DOB:10-29-1945, 78 y.o., female Today's Date: 03/14/2022   PCP: [name], MD REFERRING PROVIDER: [name], NP     OT End of Session - 08/11/23 1152     Visit Number 53    Number of Visits 81    Date for OT Re-Evaluation 08/25/23    Authorization Type Progress reporting period starting 06/16/2023    OT Start Time 1105    OT Stop Time 1145    OT Time Calculation (min) 40 min    Activity Tolerance Patient tolerated treatment well    Behavior During Therapy [facility] for tasks assessed/performed                         Past Medical History:  Diagnosis Date   Abnormal levels of other serum enzymes 07/17/2015   Arthritis     Constipation 07/11/2015   COVID-19 03/06/2021   Diabetes mellitus without complication (HCC)     Elevated liver enzymes     Fatty liver     Generalized abdominal pain 07/11/2015   Hyperlipidemia     Hypertension     Lifelong obesity     Macular degeneration     Morbid obesity due to excess calories (HCC) 07/11/2015   Other fatigue 07/11/2015   Sleep apnea     Spinal headache           Past Surgical History:  Procedure Laterality Date   ABDOMINAL HYSTERECTOMY       APPENDECTOMY       CATARACT EXTRACTION       CERVICAL LAMINECTOMY   07/21/1985   CESAREAN SECTION       COLONOSCOPY   07/21/2012    diverticulosis, [facility] Dr. [name]    COLONOSCOPY WITH PROPOFOL N/A 02/04/2019    Procedure: COLONOSCOPY WITH PROPOFOL;  Surgeon: [name], MD;  Location: [facility] ENDOSCOPY;  Service: Endoscopy;  Laterality: N/A;   COLONOSCOPY WITH PROPOFOL N/A 03/18/2021    Procedure: COLONOSCOPY WITH PROPOFOL;  Surgeon: [name], MD;  Location: [facility] ENDOSCOPY;  Service: Endoscopy;  Laterality: N/A;  DM   DIAGNOSTIC LAPAROSCOPY       ESOPHAGOGASTRODUODENOSCOPY (EGD) WITH PROPOFOL N/A 02/04/2019    Procedure: ESOPHAGOGASTRODUODENOSCOPY (EGD) WITH PROPOFOL;   Surgeon: [name], MD;  Location: [facility] ENDOSCOPY;  Service: Endoscopy;  Laterality: N/A;   ESOPHAGOGASTRODUODENOSCOPY (EGD) WITH PROPOFOL N/A 03/18/2021    Procedure: ESOPHAGOGASTRODUODENOSCOPY (EGD) WITH PROPOFOL;  Surgeon: [name], MD;  Location: [facility] ENDOSCOPY;  Service: Endoscopy;  Laterality: N/A;   LOOP RECORDER INSERTION N/A 08/05/2021    Procedure: LOOP RECORDER INSERTION;  Surgeon: [name], MD;  Location: [facility] INVASIVE CV LAB;  Service: Cardiovascular;  Laterality: N/A;   SPINE SURGERY       TUBAL LIGATION            Patient Active Problem List    Diagnosis Date Noted   Paroxysmal atrial fibrillation (HCC) 08/08/2022   Hypercoagulable state due to paroxysmal atrial fibrillation (HCC) 08/08/2022   Cerebral edema (HCC) 08/07/2021   OSA (obstructive sleep apnea) 08/07/2021   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   CVA (cerebral vascular accident) (HCC) 08/01/2021   GERD (gastroesophageal reflux disease) 07/20/2019   Advanced care planning/counseling discussion 12/17/2016   Skin lesions, generalized 11/13/2016   Chronic right hip pain 06/17/2016   Vitamin D deficiency 09/26/2015   Diastasis recti 08/08/2015  Elevated serum GGT level 07/24/2015   Essential hypertension 07/17/2015   Fatty liver 07/17/2015   Elevated alkaline phosphatase level 07/17/2015   Elevated serum glutamic pyruvic transaminase (SGPT) level 07/17/2015   Abnormal finding on EKG 07/11/2015   Morbid obesity (HCC) 07/11/2015   Colon, diverticulosis 07/11/2015   Abdominal wall hernia 07/11/2015   DM (diabetes mellitus), type 2 with neurological complications (HCC)     Hyperlipidemia      ONSET DATE: 08/01/2021   REFERRING DIAG: CVA   THERAPY DIAG:  Muscle weakness (generalized)   Rationale for Evaluation and Treatment Rehabilitation   SUBJECTIVE:   SUBJECTIVE STATEMENT:       Pt. reports having had an eye appointment prior to OT this morning.  Pt accompanied  by: self   PERTINENT HISTORY:  Pt. is a 78 y.o. female who had a unexpectedly hospitalized from 2/12-2/14/2024 for COVID-19, and a UTI.  Pt. 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 Pt. attended inpatient rehab from 08/07/2021-08/22/2021. Pt. received Home health therapy services this past spring.  Pt. has recently had an assessment through driver rehabilitation services at [facility] with recommendations for referrals for outpatient OT/PT, and ST services. Pt. PMHx includes: HTN, Hyperlipidemia, Macular degeneration, DM, and Obesity.   PRECAUTIONS: None   WEIGHT BEARING RESTRICTIONS No   PAIN: Are you having pain? No reports of pain  FALLS: Has patient fallen in last 6 months? Yes.   LIVING ENVIRONMENT: Lives with: lives with their family  Son  [name] Lives in: House/apartment Main living are on one floor Stairs:  2 steps to enter Has following equipment at home: Single point cane, Wheelchair (manual), shower chair, Grab bars, bed rail, and rubber mat.   PLOF: Independent   PATIENT GOALS: To be able to Drive, and cook again   OBJECTIVE:    HAND DOMINANCE: Right    ADLs:  Transfers/ambulation related to ADLs: Independent Eating: Independent Grooming: [employer] Pt. reports being unable to use her left hand to perform haircare.  UB Dressing: Independent pullover shirt, independent now with fastening a bra LB Dressing: Independent donning pants socks, and slide on shoes Toileting: Independent Bathing: Independent Tub Shower transfers: Walk-in shower Independent now    IADLs: Shopping: Needs to be accompanied to the grocery store. Light housekeeping: Do  Meal Prep:  Is able to perform light meal prep Community mobility: Relies on son, and friend Medication management: Son sets up Mining engineer management:TBD Handwriting: TBD   MOBILITY STATUS: Hx of falls out of bed     ACTIVITY TOLERANCE: Activity tolerance:  10-20  min. Before rest break   FUNCTIONAL OUTCOME MEASURES: FOTO: 61  TR score: 62  10/09/2022: FOTO: 49  (Recert) 06/02/23: 65  07/28/2023:  FOTO score: 62   UPPER EXTREMITY ROM      Active ROM Right eval Left eval Left  10/09/2022 Left 10/23/22 Left 12/02/2022 Left 01/13/2023 Left  03/10/2023 Left 04/21/23 Left 05/12/23 Left 06/02/23 Left 06/16/23 Left 07/28/23  Shoulder flexion WFL 54(74) scaption 0(55) 26(80) 32(82) 35(82) 35(85) 40(87) 70(96) 77 (104) 80(108) 83(108)  Shoulder abduction WFL 58(75) 32(54) 32(50) 45(60) 47(70) 62(72) 48(76) 73(102) 70 (100) 76(106) 80(110)  Shoulder adduction                Shoulder extension                Shoulder internal rotation            Thumb to L3 (T10) Thumb to  L3 (T10) Thumb L3 (T10)  Shoulder external rotation            (With elbow at side) 45 (60) (With elbow at side) 47 (60) (With elbow at side) 47  Elbow flexion [facility] WFL 145 145 145 145 145 147  WFL WFL WFL  Elbow extension [facility] WFL 10(0) 10(0) WFL WFL [facility] WFL  Wrist flexion WFL TBD [facility] WFL WFL  Wrist extension WFL TBD [facility] WFL [facility] WFL WFL  Wrist ulnar deviation                Wrist radial deviation                Wrist pronation             [facility] WFL  Wrist supination             WFL WFL  (Blank rows = not tested)     UPPER EXTREMITY MMT:      MMT Right eval Left eval Left 10/09/2022 Left 10/23/2022 Left 12/02/2022 Left 01/13/2023 Left 03/10/2023 Left 04/21/23 Left 06/02/23 Left 06/16/23 Left 07/28/23  Shoulder flexion 4+/5 2+/5 1/5 2-/5 2-/5 2-/5 2-/5 2-/5 2- 2+/5 3-/5  Shoulder abduction 4+/5 2+/5 2-/5 2-/5 2-/5 2-/5 2-/5 2-/5 2- 2+/5 3-/5  Shoulder adduction               Shoulder extension               Shoulder internal rotation           4- 4-/5   Shoulder external rotation           3- 3-/5   Middle trapezius               Lower trapezius               Elbow flexion 4+/5 3+/5 3/5 3/5 3+/5 3+/5 4/5 4/5 4+/5 4+/5  4+/5  Elbow extension 4+/5 3+/5 3/5 3/5 3+/5 3+/5 4/5 4/5 4/5 4+/5 4+/5  Wrist flexion 4+/5 TBD 3/5 3/5 3+/5 3+/5 4/5 4/5 4+/5 4+/5 4+/5  Wrist extension 4+/5 TBD 3/5 3/5 3+/5 3+/5 4/5 4/5 4/5 4/5 4+/5  Wrist ulnar deviation               Wrist radial deviation               Wrist pronation               Wrist supination               (Blank rows = not tested)   HAND FUNCTION: Grip strength: Right: 33 lbs; Left: 11 lbs, Lateral pinch: Right: 17 lbs, Left: 2 lbs, and 3 point pinch: Right: 17 lbs, Left: 2 lbs  10/09/2022: Grip strength: Right: 33 lbs; Left: 10 lbs, Lateral pinch: Right: 17 lbs, Left: 1 lbs, and 3 point pinch: Right: 17 lbs, Left: 1 lbs   10/23/2022: Grip strength: Right: 33 lbs; Left: 16 lbs, Lateral pinch: Right: 17 lbs, Left: 1 lbs, and 3 point pinch: Right: 17 lbs, Left: 1 lbs  12/02/2022: Grip strength: Right: 33 lbs; Left: 17 lbs, Lateral pinch: Right: 17 lbs, Left: 7 lbs, and 3 point pinch: Right: 17 lbs, Left: 3 lbs   01/13/2023: Grip strength: Right: 33 lbs; Left: 15 lbs, Lateral pinch: Right: 17 lbs, Left: 9 lbs, and 3 point  pinch: Right: 17 lbs, Left: 7 lbs  03/10/2023: Grip strength: Right: 33 lbs; Left: 15 lbs, Lateral pinch: Right: 17 lbs, Left: 9 lbs, and 3 point pinch: Right: 17 lbs, Left: 7 lbs   04/21/2023: Grip strength: Right: 34 lbs; Left: 13 lbs, Lateral pinch: NT 3 point pinch: Right: NT  06/02/23: Grip strength: Right: 34 lbs; Left: 19 lbs, Lateral pinch: Right: 16 lbs; Left: 6 lbs; 3 point pinch: Right: 13 lbs, Left: 7 lbs   06/16/2023: Grip strength: Right: 39 lbs; Left: 19 lbs, Lateral pinch: Right: 16 lbs; Left: 5 lbs; 3 point pinch: Right: 13 lbs, Left: 9 lbs   07/28/2023: Grip strength: Right: 39 lbs; Left: 20 lbs, Lateral pinch: Right: 16 lbs; Left: 7 lbs; 3 point pinch: Right: 13 lbs, Left: 9 lbs         COORDINATION:   9 Hole Peg test: Right: 27 sec.  sec; Left: 52 sec.  10/09/2022: 9 Hole Peg test: Right: 27 sec.  sec; Left: 1 min. & 35  sec.  01/13/2023: 9 Hole Peg test: Right: 27 sec.  sec; Left: 51. Sec.  03/10/2023: 9 Hole Peg test: Right: 27 sec.  sec; Left: 54 Sec.  04/21/2023: 9 Hole Peg test: Right: 27 sec.  sec; Left: 46 Sec.  06/02/23: 9 hole Peg test: Left: 47 sec  06/15/23: 9 hole Peg test: Left: 45 sec  07/28/2023: 9 hole Peg test: Left: 44 sec   SENSATION: Light touch: WFL Proprioception: WFL   COGNITION: Overall cognitive status: Within functional limits for tasks assessed   VISION: Subjective report: Glasses. Just received a new prescription to increase bifocal strength Baseline vision: Macular Degeneration Visual history: macular degeneration   VISION ASSESSMENT: To be further assessed in functional context  Left sided Awareness     PERCEPTION: Limited left sided awareness   TODAY'S TREATMENT:   There. Ex.:   Pt. tolerated AROM/AAROM/PROM for shoulder flexion, abduction. Pt. worked on the [employer] for 8 min. with constant monitoring of the BUE on level 4.0 Pt. Performed bilateral shoulder flexion, chest press, circular motion, and "V" pattern for 2 sets of 10 reps each with the 2#.  Neuromuscular re-education:  Pt. performed [facility] tasks using the Grooved pegboard. Pt. worked on grasping the grooved pegs from a horizontal position, and moving the pegs to a vertical position in the hand to prepare for placing them in the grooved slot.       PATIENT EDUCATION: Education details: UE strengthening, FMC, functional reach Person educated: Patient Education method: verbal cues Education comprehension: verbalized understanding    HOME EXERCISE PROGRAM:  Reviewed home activities to enhance left hand digit extension during typing skills.   GOALS: Goals reviewed with patient? Yes     SHORT TERM GOALS: Target date: 07/21/23   Pt. Will improve FOTO score by 2 points to reflect improved pt. perceived functional performance Baseline: 07/28/2023: FOTO score: 62, 06/16/2023: FOTO score: 70  06/02/23: 65; 04/21/2023: 49 03/10/2023: 59 01/13/2023: 49 12/01/2022: FOTO: 51 10/23/2022: FOTO 49 10/09/2022: FOTO 49, FOTO: 61 TR score: 62 Goal status: achieved/ongoing   LONG TERM GOALS: Target date: 08/25/2023   Pt. Will improve left shoulder ROM by 10 degrees to be able able to efficiently apply deodorant Baseline: 07/28/2023: shoulder flexion 83(108), abd 80(110)  06/16/23: shoulder flexion 80 (108), abd 76(106) Pt. Is able to apply deodorant to the left, however takes increased time.; 06/02/23: shoulder flexion 77 (104), abd 70 (100); 04/21/2023: shoulder flexion: 40(87), Abduction: 48(76) 03/10/2023: Shoulder flexion:  35(85) 01/13/2023:  Shoulder flexion: 35(82), Abduction: 47(70) 12/02/2022: Shoulder flexion: 32(82), Abduction: 45(60)  10/23/2022: Shoulder flexion: 26(80), Abduction: 32(50) 10/09/2022: flexion: 0(55), Abduction: 32(54) Goal status: Improving, ongoing   2.  Pt. will be able to independently hold and use a blow dryer, and brush for hair care. Baseline: 07/28/2023: Pt can hold a blow dryer, and is able to move the blow dryer within her hand, however reaches only to L side of head and lower part of back of head with brush and blow dryer d/t limited ER ROM and strength11/26/2024: shoulder flexion 80 (108), abd 76(106) 06/02/23: Pt can hold a blow dryer but reaches only to L side of head and lower part of back of head with brush and blow dryer d/t limited ER ROM and strength; 04/21/2023: Pt. is able to hold the hair dryer in the left hand, however is unable to reach up to dry it, and has difficulty using a brush with the left UE. 03/10/2023: Pt. continues to be able to hold the hair dryer in the left hand, however is unable to reach up to dry it, and has difficulty using a brush 01/13/2023: Pt. Can hold the hair dryer in the left hand, however is unable to reach up to dry it, and has difficulty using a brush. 12/02/2022: Pt. is unable to actively elevate he left shoulder in order to blow dry her hair.  10/23/2022: Pt. continues to be unable to hold a blow dryer. Eval: Pt. Is unable to sustain her BUEs in elevation, and use a blow dryer, and brush. 10/09/2022: Pt. Is unable to hold a blow dryer. Eval: Pt. Is unable to sustain her BUEs in elevation, and use a blow dryer, and brush. Goal status: improving/ongoing   3.  Pt. Will improve left lateral pinch strength by 3# to be able to independently cut meat. Baseline:07/28/2023:  Lateral pinch: Left: 7 lbs.06/16/2023: Lateral pinch: Left: 5 lbs. Pt. Has difficulty cutting steak, however is able to cut tender pieces of chicken. 06/02/23: L lateral pinch: 6# (pt reports she lacks the strength to cut steak but can cut tender chicken); 04/21/2023: NT(Pinch Meter sent out for calibration) Pt. continues to have difficulty cutting meat. 03/10/2023: Left lateral pinch strength: 9#   01/13/2023: Left lateral pinch strength: 9#  12/02/2022: Left pinch: 7# Pt. has difficulty cutting meat. 10/23/2022: Pt. Continues to present with difficulty cutting meat. 10/09/2022: 1#  Eval: 2#. Pt. has difficulty stabilizing utensil, and food while cutting food. Goal status: Ongoing   4.  Pt. Will improve Left hand [facility] skills to be able to be able to independently, and efficiently manipulate small objects during ADL tasks. Baseline: 1/0/2025: Left FMC skills: 44 sec. 06/16/2023: Left 45 sec. 06/02/2023: L 47 sec; 04/21/2023:  46 sec. 03/10/2023: 54 sec. 01/13/2023: 51 sec. 12/02/2022: TBD  10/23/2022: Pt. Continues to present with difficulty manipulating small objects. 10/09/2022: Left: 1 min & 35 sec.: Eval: Left: 52 sec. Right: 27 sec. Goal status: Improving, Ongoing    6.: Pt. will improve with left grip strength to be able to hold and pull up pants Baseline: 07/28/2023: Left grip strength: 20# 06/16/2023: Left grip strength: 19# Pt. Is now able to pull her pants up.06/02/23: L grip 19#, improving; 04/21/2023: Pt. is able to pull up covers, however has difficulty pulling up pants. 03/10/2023:  left  grip strength: 15# 01/13/2023: left  grip strength: 15# 12/02/2022: 17# 10/23/2022: Left: 16# 10/09/2022: Left: 10# Eval:  Left grip strength 11#. Pt. has difficulty holding  a full drink  securely with the left hand.             Goal status: Improved   7. Pt. will improve typing speed, and accuracy in preparation for efficiently typing simple email message. Baseline: 07/28/2023: Pt. now has a working laptop available for her to use. 06/16/2023:  Limited ability to carry-over practice at home due to Pt.'s mouse at home is not working. Pt. Also can not see the keys on her computer at home. 06/02/23: same as 04/21/23; 04/21/2023:  Pt. continues to present with limited speed, and accuracy with typing  03/10/2023: Pt. continues to present with limited speed, and accuracy with typing 01/13/2023: TBD 12/02/2022:  Pt. continues to present with limited speed, and accuracy with typing.10/23/2022: Pt. continues to present with limited speed, and accuracy with typing. 10/09/2022: Pt. presents with difficulty typing. Pt. presents with difficulty typing an email. Typing speed, and accuracy TBD             Goal status:  Resume goal                                CLINICAL IMPRESSION:  Pt. reports having a floater after her eye appointment. Pt. reports being told that it will go away after 24 hours. Pt. reports that the BUE exercise feels good today, and tolerated it well. Pt. continues to present with limited LUE ROM. Pt. had more difficulty seeing the direction of the pegboard holes today. Pt. continues to benefit from OT services to work towards improving Left UE functioning in order to maximize engagement in, and overall independence with ADLs, and IADL tasks, while reducing risk of adhesive capsulitis.     PERFORMANCE DEFICITS in functional skills including ADLs, IADLs, coordination, dexterity, sensation, ROM, strength, FMC, decreased knowledge of use of DME, vision, and UE functional use, cognitive skills including  attention, and psychosocial skills including coping strategies, environmental adaptation, habits, and routines and behaviors.    IMPAIRMENTS are limiting patient from ADLs, IADLs, and social participation.    COMORBIDITIES may have co-morbidities  that affects occupational performance. Patient will benefit from skilled OT to address above impairments and improve overall function.   MODIFICATION OR ASSISTANCE TO COMPLETE EVALUATION: Min-Moderate modification of tasks or assist with assess necessary to complete an evaluation.   OT OCCUPATIONAL PROFILE AND HISTORY: Detailed assessment: Review of records and additional review of physical, cognitive, psychosocial history related to current functional performance.   CLINICAL DECISION MAKING: Moderate - several treatment options, min-mod task modification necessary   REHAB POTENTIAL: Good   EVALUATION COMPLEXITY: Moderate      PLAN: OT FREQUENCY: 2x/week   OT DURATION: 12 weeks   PLANNED INTERVENTIONS: self care/ADL training, therapeutic exercise, therapeutic activity, neuromuscular re-education, manual therapy, passive range of motion, and paraffin   RECOMMENDED OTHER SERVICES:    CONSULTED AND AGREED WITH PLAN OF CARE: Patient   PLAN FOR NEXT SESSION:  Left hand strengthening and coordination exercises  Olegario Messier, MS, OTR/L    08/11/23, 11:59 AM

## 2023-08-13 ENCOUNTER — Ambulatory Visit: Payer: Medicare HMO | Admitting: Occupational Therapy

## 2023-08-13 DIAGNOSIS — R278 Other lack of coordination: Secondary | ICD-10-CM

## 2023-08-13 DIAGNOSIS — M6281 Muscle weakness (generalized): Secondary | ICD-10-CM

## 2023-08-13 NOTE — Therapy (Addendum)
OCCUPATIONAL THERAPY TREATMENT NOTE   Patient Name: Alyssa Mayo MRN: [id number] DOB:1945-08-19, 78 y.o., female Today's Date: 03/14/2022   PCP: [name], MD REFERRING PROVIDER: [name], NP     OT End of Session - 08/13/23 1125     Visit Number 54    Number of Visits 81    Date for OT Re-Evaluation 08/25/23    Authorization Type Progress reporting period starting 06/16/2023    OT Start Time 1024    OT Stop Time 1110    OT Time Calculation (min) 46 min    Activity Tolerance Patient tolerated treatment well    Behavior During Therapy [facility] for tasks assessed/performed                         Past Medical History:  Diagnosis Date   Abnormal levels of other serum enzymes 07/17/2015   Arthritis     Constipation 07/11/2015   COVID-19 03/06/2021   Diabetes mellitus without complication (HCC)     Elevated liver enzymes     Fatty liver     Generalized abdominal pain 07/11/2015   Hyperlipidemia     Hypertension     Lifelong obesity     Macular degeneration     Morbid obesity due to excess calories (HCC) 07/11/2015   Other fatigue 07/11/2015   Sleep apnea     Spinal headache           Past Surgical History:  Procedure Laterality Date   ABDOMINAL HYSTERECTOMY       APPENDECTOMY       CATARACT EXTRACTION       CERVICAL LAMINECTOMY   07/21/1985   CESAREAN SECTION       COLONOSCOPY   07/21/2012    diverticulosis, [facility] Dr. [name]    COLONOSCOPY WITH PROPOFOL N/A 02/04/2019    Procedure: COLONOSCOPY WITH PROPOFOL;  Surgeon: [name], MD;  Location: [facility] ENDOSCOPY;  Service: Endoscopy;  Laterality: N/A;   COLONOSCOPY WITH PROPOFOL N/A 03/18/2021    Procedure: COLONOSCOPY WITH PROPOFOL;  Surgeon: [name], MD;  Location: [facility] ENDOSCOPY;  Service: Endoscopy;  Laterality: N/A;  DM   DIAGNOSTIC LAPAROSCOPY       ESOPHAGOGASTRODUODENOSCOPY (EGD) WITH PROPOFOL N/A 02/04/2019    Procedure: ESOPHAGOGASTRODUODENOSCOPY (EGD) WITH PROPOFOL;   Surgeon: [name], MD;  Location: [facility] ENDOSCOPY;  Service: Endoscopy;  Laterality: N/A;   ESOPHAGOGASTRODUODENOSCOPY (EGD) WITH PROPOFOL N/A 03/18/2021    Procedure: ESOPHAGOGASTRODUODENOSCOPY (EGD) WITH PROPOFOL;  Surgeon: [name], MD;  Location: [facility] ENDOSCOPY;  Service: Endoscopy;  Laterality: N/A;   LOOP RECORDER INSERTION N/A 08/05/2021    Procedure: LOOP RECORDER INSERTION;  Surgeon: [name], MD;  Location: [facility] INVASIVE CV LAB;  Service: Cardiovascular;  Laterality: N/A;   SPINE SURGERY       TUBAL LIGATION            Patient Active Problem List    Diagnosis Date Noted   Paroxysmal atrial fibrillation (HCC) 08/08/2022   Hypercoagulable state due to paroxysmal atrial fibrillation (HCC) 08/08/2022   Cerebral edema (HCC) 08/07/2021   OSA (obstructive sleep apnea) 08/07/2021   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   CVA (cerebral vascular accident) (HCC) 08/01/2021   GERD (gastroesophageal reflux disease) 07/20/2019   Advanced care planning/counseling discussion 12/17/2016   Skin lesions, generalized 11/13/2016   Chronic right hip pain 06/17/2016   Vitamin D deficiency 09/26/2015   Diastasis recti 08/08/2015  Elevated serum GGT level 07/24/2015   Essential hypertension 07/17/2015   Fatty liver 07/17/2015   Elevated alkaline phosphatase level 07/17/2015   Elevated serum glutamic pyruvic transaminase (SGPT) level 07/17/2015   Abnormal finding on EKG 07/11/2015   Morbid obesity (HCC) 07/11/2015   Colon, diverticulosis 07/11/2015   Abdominal wall hernia 07/11/2015   DM (diabetes mellitus), type 2 with neurological complications (HCC)     Hyperlipidemia      ONSET DATE: 08/01/2021   REFERRING DIAG: CVA   THERAPY DIAG:  Muscle weakness (generalized)   Rationale for Evaluation and Treatment Rehabilitation   SUBJECTIVE:   SUBJECTIVE STATEMENT:       Pt. was late for the session this morning. Pt. reports that she received a  notification from her rIng camera that she left the door at home open. They had to turn back to close it.   Pt accompanied by: self   PERTINENT HISTORY:  Pt. is a 78 y.o. female who had a unexpectedly hospitalized from 2/12-2/14/2024 for COVID-19, and a UTI.  Pt. 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 Pt. attended inpatient rehab from 08/07/2021-08/22/2021. Pt. received Home health therapy services this past spring.  Pt. has recently had an assessment through driver rehabilitation services at [facility] with recommendations for referrals for outpatient OT/PT, and ST services. Pt. PMHx includes: HTN, Hyperlipidemia, Macular degeneration, DM, and Obesity.   PRECAUTIONS: None   WEIGHT BEARING RESTRICTIONS No   PAIN: Are you having pain? No reports of pain  FALLS: Has patient fallen in last 6 months? Yes.   LIVING ENVIRONMENT: Lives with: lives with their family  Son  [name] Lives in: House/apartment Main living are on one floor Stairs:  2 steps to enter Has following equipment at home: Single point cane, Wheelchair (manual), shower chair, Grab bars, bed rail, and rubber mat.   PLOF: Independent   PATIENT GOALS: To be able to Drive, and cook again   OBJECTIVE:    HAND DOMINANCE: Right    ADLs:  Transfers/ambulation related to ADLs: Independent Eating: Independent Grooming: [employer] Pt. reports being unable to use her left hand to perform haircare.  UB Dressing: Independent pullover shirt, independent now with fastening a bra LB Dressing: Independent donning pants socks, and slide on shoes Toileting: Independent Bathing: Independent Tub Shower transfers: Walk-in shower Independent now    IADLs: Shopping: Needs to be accompanied to the grocery store. Light housekeeping: Do  Meal Prep:  Is able to perform light meal prep Community mobility: Relies on son, and friend Medication management: Son sets up Mining engineer  management:TBD Handwriting: TBD   MOBILITY STATUS: Hx of falls out of bed     ACTIVITY TOLERANCE: Activity tolerance:  10-20 min. Before rest break   FUNCTIONAL OUTCOME MEASURES: FOTO: 61  TR score: 62  10/09/2022: FOTO: 49  (Recert) 06/02/23: 65  07/28/2023:  FOTO score: 62   UPPER EXTREMITY ROM      Active ROM Right eval Left eval Left  10/09/2022 Left 10/23/22 Left 12/02/2022 Left 01/13/2023 Left  03/10/2023 Left 04/21/23 Left 05/12/23 Left 06/02/23 Left 06/16/23 Left 07/28/23  Shoulder flexion WFL 54(74) scaption 0(55) 26(80) 32(82) 35(82) 35(85) 40(87) 70(96) 77 (104) 80(108) 83(108)  Shoulder abduction WFL 58(75) 32(54) 32(50) 45(60) 47(70) 62(72) 48(76) 73(102) 70 (100) 76(106) 80(110)  Shoulder adduction                Shoulder extension  Shoulder internal rotation            Thumb to L3 (T10) Thumb to L3 (T10) Thumb L3 (T10)  Shoulder external rotation            (With elbow at side) 45 (60) (With elbow at side) 47 (60) (With elbow at side) 47  Elbow flexion [facility] WFL 145 145 145 145 145 147  WFL WFL WFL  Elbow extension [facility] WFL 10(0) 10(0) WFL WFL [facility]  [facility] WFL  Wrist flexion WFL TBD [facility] WFL WFL  Wrist extension WFL TBD [facility] WFL [facility]  [facility] WFL WFL  Wrist ulnar deviation                Wrist radial deviation                Wrist pronation             [facility] WFL  Wrist supination             WFL WFL  (Blank rows = not tested)     UPPER EXTREMITY MMT:      MMT Right eval Left eval Left 10/09/2022 Left 10/23/2022 Left 12/02/2022 Left 01/13/2023 Left 03/10/2023 Left 04/21/23 Left 06/02/23 Left 06/16/23 Left 07/28/23  Shoulder flexion 4+/5 2+/5 1/5 2-/5 2-/5 2-/5 2-/5 2-/5 2- 2+/5 3-/5  Shoulder abduction 4+/5 2+/5 2-/5 2-/5 2-/5 2-/5 2-/5 2-/5 2- 2+/5 3-/5  Shoulder adduction               Shoulder extension               Shoulder internal rotation           4- 4-/5   Shoulder external rotation           3-  3-/5   Middle trapezius               Lower trapezius               Elbow flexion 4+/5 3+/5 3/5 3/5 3+/5 3+/5 4/5 4/5 4+/5 4+/5 4+/5  Elbow extension 4+/5 3+/5 3/5 3/5 3+/5 3+/5 4/5 4/5 4/5 4+/5 4+/5  Wrist flexion 4+/5 TBD 3/5 3/5 3+/5 3+/5 4/5 4/5 4+/5 4+/5 4+/5  Wrist extension 4+/5 TBD 3/5 3/5 3+/5 3+/5 4/5 4/5 4/5 4/5 4+/5  Wrist ulnar deviation               Wrist radial deviation               Wrist pronation               Wrist supination               (Blank rows = not tested)   HAND FUNCTION: Grip strength: Right: 33 lbs; Left: 11 lbs, Lateral pinch: Right: 17 lbs, Left: 2 lbs, and 3 point pinch: Right: 17 lbs, Left: 2 lbs  10/09/2022: Grip strength: Right: 33 lbs; Left: 10 lbs, Lateral pinch: Right: 17 lbs, Left: 1 lbs, and 3 point pinch: Right: 17 lbs, Left: 1 lbs   10/23/2022: Grip strength: Right: 33 lbs; Left: 16 lbs, Lateral pinch: Right: 17 lbs, Left: 1 lbs, and 3 point pinch: Right: 17 lbs, Left: 1 lbs  12/02/2022: Grip strength: Right: 33 lbs; Left: 17 lbs, Lateral pinch: Right: 17 lbs, Left: 7 lbs, and 3 point pinch: Right: 17 lbs, Left: 3 lbs  01/13/2023: Grip strength: Right: 33 lbs; Left: 15 lbs, Lateral pinch: Right: 17 lbs, Left: 9 lbs, and 3 point pinch: Right: 17 lbs, Left: 7 lbs  03/10/2023: Grip strength: Right: 33 lbs; Left: 15 lbs, Lateral pinch: Right: 17 lbs, Left: 9 lbs, and 3 point pinch: Right: 17 lbs, Left: 7 lbs   04/21/2023: Grip strength: Right: 34 lbs; Left: 13 lbs, Lateral pinch: NT 3 point pinch: Right: NT  06/02/23: Grip strength: Right: 34 lbs; Left: 19 lbs, Lateral pinch: Right: 16 lbs; Left: 6 lbs; 3 point pinch: Right: 13 lbs, Left: 7 lbs   06/16/2023: Grip strength: Right: 39 lbs; Left: 19 lbs, Lateral pinch: Right: 16 lbs; Left: 5 lbs; 3 point pinch: Right: 13 lbs, Left: 9 lbs   07/28/2023: Grip strength: Right: 39 lbs; Left: 20 lbs, Lateral pinch: Right: 16 lbs; Left: 7 lbs; 3 point pinch: Right: 13 lbs, Left: 9 lbs          COORDINATION:   9 Hole Peg test: Right: 27 sec.  sec; Left: 52 sec.  10/09/2022: 9 Hole Peg test: Right: 27 sec.  sec; Left: 1 min. & 35 sec.  01/13/2023: 9 Hole Peg test: Right: 27 sec.  sec; Left: 51. Sec.  03/10/2023: 9 Hole Peg test: Right: 27 sec.  sec; Left: 54 Sec.  04/21/2023: 9 Hole Peg test: Right: 27 sec.  sec; Left: 46 Sec.  06/02/23: 9 hole Peg test: Left: 47 sec  06/15/23: 9 hole Peg test: Left: 45 sec  07/28/2023: 9 hole Peg test: Left: 44 sec   SENSATION: Light touch: WFL Proprioception: WFL   COGNITION: Overall cognitive status: Within functional limits for tasks assessed   VISION: Subjective report: Glasses. Just received a new prescription to increase bifocal strength Baseline vision: Macular Degeneration Visual history: macular degeneration   VISION ASSESSMENT: To be further assessed in functional context  Left sided Awareness     PERCEPTION: Limited left sided awareness   TODAY'S TREATMENT:   There. Ex.:   Pt. tolerated AROM/AAROM/PROM for shoulder flexion, abduction. Pt. worked on the [employer] for 8 min. with constant monitoring of the BUE on level 4.0 Pt. Performed left shoulder reps using the vertical finger ladder.  Neuromuscular re-education:  Pt. worked on grasping, and manipulating 1/2", 3/4", and 1" washers from a magnetic dish using point grasp pattern. Pt. worked on reaching up, stabilizing, and sustaining shoulder elevation while placing the washer over a small precise target on vertical dowels positioned at various angles.  Pt. Required assist proximally at the elbow to sustain reach.        PATIENT EDUCATION: Education details: UE strengthening, FMC, functional reach Person educated: Patient Education method: verbal cues Education comprehension: verbalized understanding    HOME EXERCISE PROGRAM:  Reviewed home activities to enhance left hand digit extension during typing skills.   GOALS: Goals reviewed with patient? Yes      SHORT TERM GOALS: Target date: 07/21/23   Pt. Will improve FOTO score by 2 points to reflect improved pt. perceived functional performance Baseline: 07/28/2023: FOTO score: 62, 06/16/2023: FOTO score: 70 06/02/23: 65; 04/21/2023: 49 03/10/2023: 59 01/13/2023: 49 12/01/2022: FOTO: 51 10/23/2022: FOTO 49 10/09/2022: FOTO 49, FOTO: 61 TR score: 62 Goal status: achieved/ongoing   LONG TERM GOALS: Target date: 08/25/2023   Pt. Will improve left shoulder ROM by 10 degrees to be able able to efficiently apply deodorant Baseline: 07/28/2023: shoulder flexion 83(108), abd 80(110)  06/16/23: shoulder flexion 80 (108), abd 76(106) Pt. Is able to  apply deodorant to the left, however takes increased time.; 06/02/23: shoulder flexion 77 (104), abd 70 (100); 04/21/2023: shoulder flexion: 40(87), Abduction: 48(76) 03/10/2023: Shoulder flexion: 35(85) 01/13/2023:  Shoulder flexion: 35(82), Abduction: 47(70) 12/02/2022: Shoulder flexion: 32(82), Abduction: 45(60)  10/23/2022: Shoulder flexion: 26(80), Abduction: 32(50) 10/09/2022: flexion: 0(55), Abduction: 32(54) Goal status: Improving, ongoing   2.  Pt. will be able to independently hold and use a blow dryer, and brush for hair care. Baseline: 07/28/2023: Pt can hold a blow dryer, and is able to move the blow dryer within her hand, however reaches only to L side of head and lower part of back of head with brush and blow dryer d/t limited ER ROM and strength11/26/2024: shoulder flexion 80 (108), abd 76(106) 06/02/23: Pt can hold a blow dryer but reaches only to L side of head and lower part of back of head with brush and blow dryer d/t limited ER ROM and strength; 04/21/2023: Pt. is able to hold the hair dryer in the left hand, however is unable to reach up to dry it, and has difficulty using a brush with the left UE. 03/10/2023: Pt. continues to be able to hold the hair dryer in the left hand, however is unable to reach up to dry it, and has difficulty using a brush 01/13/2023: Pt.  Can hold the hair dryer in the left hand, however is unable to reach up to dry it, and has difficulty using a brush. 12/02/2022: Pt. is unable to actively elevate he left shoulder in order to blow dry her hair. 10/23/2022: Pt. continues to be unable to hold a blow dryer. Eval: Pt. Is unable to sustain her BUEs in elevation, and use a blow dryer, and brush. 10/09/2022: Pt. Is unable to hold a blow dryer. Eval: Pt. Is unable to sustain her BUEs in elevation, and use a blow dryer, and brush. Goal status: improving/ongoing   3.  Pt. Will improve left lateral pinch strength by 3# to be able to independently cut meat. Baseline:07/28/2023:  Lateral pinch: Left: 7 lbs.06/16/2023: Lateral pinch: Left: 5 lbs. Pt. Has difficulty cutting steak, however is able to cut tender pieces of chicken. 06/02/23: L lateral pinch: 6# (pt reports she lacks the strength to cut steak but can cut tender chicken); 04/21/2023: NT(Pinch Meter sent out for calibration) Pt. continues to have difficulty cutting meat. 03/10/2023: Left lateral pinch strength: 9#   01/13/2023: Left lateral pinch strength: 9#  12/02/2022: Left pinch: 7# Pt. has difficulty cutting meat. 10/23/2022: Pt. Continues to present with difficulty cutting meat. 10/09/2022: 1#  Eval: 2#. Pt. has difficulty stabilizing utensil, and food while cutting food. Goal status: Ongoing   4.  Pt. Will improve Left hand [facility] skills to be able to be able to independently, and efficiently manipulate small objects during ADL tasks. Baseline: 1/0/2025: Left FMC skills: 44 sec. 06/16/2023: Left 45 sec. 06/02/2023: L 47 sec; 04/21/2023:  46 sec. 03/10/2023: 54 sec. 01/13/2023: 51 sec. 12/02/2022: TBD  10/23/2022: Pt. Continues to present with difficulty manipulating small objects. 10/09/2022: Left: 1 min & 35 sec.: Eval: Left: 52 sec. Right: 27 sec. Goal status: Improving, Ongoing    6.: Pt. will improve with left grip strength to be able to hold and pull up pants Baseline: 07/28/2023: Left grip  strength: 20# 06/16/2023: Left grip strength: 19# Pt. Is now able to pull her pants up.06/02/23: L grip 19#, improving; 04/21/2023: Pt. is able to pull up covers, however has difficulty pulling up pants. 03/10/2023: left  grip  strength: 15# 01/13/2023: left  grip strength: 15# 12/02/2022: 17# 10/23/2022: Left: 16# 10/09/2022: Left: 10# Eval:  Left grip strength 11#. Pt. has difficulty holding a full drink  securely with the left hand.             Goal status: Improved   7. Pt. will improve typing speed, and accuracy in preparation for efficiently typing simple email message. Baseline: 07/28/2023: Pt. now has a working laptop available for her to use. 06/16/2023:  Limited ability to carry-over practice at home due to Pt.'s mouse at home is not working. Pt. Also can not see the keys on her computer at home. 06/02/23: same as 04/21/23; 04/21/2023:  Pt. continues to present with limited speed, and accuracy with typing  03/10/2023: Pt. continues to present with limited speed, and accuracy with typing 01/13/2023: TBD 12/02/2022:  Pt. continues to present with limited speed, and accuracy with typing.10/23/2022: Pt. continues to present with limited speed, and accuracy with typing. 10/09/2022: Pt. presents with difficulty typing. Pt. presents with difficulty typing an email. Typing speed, and accuracy TBD             Goal status:  Resume goal                                CLINICAL IMPRESSION:  Pt. continues to present with limited LUE ROM, however was able to sustain LUE in elevation while manipulating washers, and preparing to place the over vertical, and diagonal dowels. Pt. was able to perform left shoulder flexion consistently up 16 steps on the vertical finger ladder with cues to keep heels flat on the ground while avoiding coming up forward on her toes. Pt. required assist proximally to sustain the LUE in elevation while placing the washers over the dowels. Proximal support was tapered down. Pt. continues to benefit  from OT services to work towards improving Left UE functioning in order to maximize engagement in, and overall independence with ADLs, and IADL tasks, while reducing risk of adhesive capsulitis.     PERFORMANCE DEFICITS in functional skills including ADLs, IADLs, coordination, dexterity, sensation, ROM, strength, FMC, decreased knowledge of use of DME, vision, and UE functional use, cognitive skills including attention, and psychosocial skills including coping strategies, environmental adaptation, habits, and routines and behaviors.    IMPAIRMENTS are limiting patient from ADLs, IADLs, and social participation.    COMORBIDITIES may have co-morbidities  that affects occupational performance. Patient will benefit from skilled OT to address above impairments and improve overall function.   MODIFICATION OR ASSISTANCE TO COMPLETE EVALUATION: Min-Moderate modification of tasks or assist with assess necessary to complete an evaluation.   OT OCCUPATIONAL PROFILE AND HISTORY: Detailed assessment: Review of records and additional review of physical, cognitive, psychosocial history related to current functional performance.   CLINICAL DECISION MAKING: Moderate - several treatment options, min-mod task modification necessary   REHAB POTENTIAL: Good   EVALUATION COMPLEXITY: Moderate      PLAN: OT FREQUENCY: 2x/week   OT DURATION: 12 weeks   PLANNED INTERVENTIONS: self care/ADL training, therapeutic exercise, therapeutic activity, neuromuscular re-education, manual therapy, passive range of motion, and paraffin   RECOMMENDED OTHER SERVICES:    CONSULTED AND AGREED WITH PLAN OF CARE: Patient   PLAN FOR NEXT SESSION:  Left hand strengthening and coordination exercises  Olegario Messier, MS, OTR/L    08/13/23, 11:30 AM

## 2023-08-18 ENCOUNTER — Ambulatory Visit: Payer: Medicare HMO | Admitting: Occupational Therapy

## 2023-08-20 ENCOUNTER — Ambulatory Visit: Payer: Medicare HMO | Admitting: Occupational Therapy

## 2023-08-20 DIAGNOSIS — M6281 Muscle weakness (generalized): Secondary | ICD-10-CM

## 2023-08-20 NOTE — Therapy (Signed)
OCCUPATIONAL THERAPY TREATMENT NOTE   Patient Name: Alyssa Mayo MRN: [id number] DOB:03/16/1946, 78 y.o., female Today's Date: 03/14/2022   PCP: [name], MD REFERRING PROVIDER: [name], NP     OT End of Session - 08/20/23 1452     Visit Number 55    Number of Visits 81    Date for OT Re-Evaluation 08/25/23    Authorization Type Progress reporting period starting 06/16/2023    OT Start Time 1200    OT Stop Time 1230    OT Time Calculation (min) 30 min    Activity Tolerance Patient tolerated treatment well    Behavior During Therapy [facility] for tasks assessed/performed                         Past Medical History:  Diagnosis Date   Abnormal levels of other serum enzymes 07/17/2015   Arthritis     Constipation 07/11/2015   COVID-19 03/06/2021   Diabetes mellitus without complication (HCC)     Elevated liver enzymes     Fatty liver     Generalized abdominal pain 07/11/2015   Hyperlipidemia     Hypertension     Lifelong obesity     Macular degeneration     Morbid obesity due to excess calories (HCC) 07/11/2015   Other fatigue 07/11/2015   Sleep apnea     Spinal headache           Past Surgical History:  Procedure Laterality Date   ABDOMINAL HYSTERECTOMY       APPENDECTOMY       CATARACT EXTRACTION       CERVICAL LAMINECTOMY   07/21/1985   CESAREAN SECTION       COLONOSCOPY   07/21/2012    diverticulosis, [facility] Dr. [name]    COLONOSCOPY WITH PROPOFOL N/A 02/04/2019    Procedure: COLONOSCOPY WITH PROPOFOL;  Surgeon: [name], MD;  Location: [facility] END[facility]COPY;  Service: Endoscopy;  Laterality: N/A;   COLONOSCOPY WITH PROPOFOL N/A 03/18/2021    Procedure: COLONOSCOPY WITH PROPOFOL;  Surgeon: [name], MD;  Location: [facility] ENDOSCOPY;  Service: Endoscopy;  Laterality: N/A;  DM   DIAGNOSTIC LAPAROSCOPY       ESOPHAGOGASTRODUODENOSCOPY (EGD) WITH PROPOFOL N/A 02/04/2019    Procedure: ESOPHAGOGASTRODUODENOSCOPY (EGD) WITH PROPOFOL;   Surgeon: [name], MD;  Location: [facility] ENDOSCOPY;  Service: Endoscopy;  Laterality: N/A;   ESOPHAGOGASTRODUODENOSCOPY (EGD) WITH PROPOFOL N/A 03/18/2021    Procedure: ESOPHAGOGASTRODUODENOSCOPY (EGD) WITH PROPOFOL;  Surgeon: [name], MD;  Location: [facility] ENDOSCOPY;  Service: Endoscopy;  Laterality: N/A;   LOOP RECORDER INSERTION N/A 08/05/2021    Procedure: LOOP RECORDER INSERTION;  Surgeon: [name], MD;  Location: [facility] INVASIVE CV LAB;  Service: Cardiovascular;  Laterality: N/A;   SPINE SURGERY       TUBAL LIGATION            Patient Active Problem List    Diagnosis Date Noted   Paroxysmal atrial fibrillation (HCC) 08/08/2022   Hypercoagulable state due to paroxysmal atrial fibrillation (HCC) 08/08/2022   Cerebral edema (HCC) 08/07/2021   OSA (obstructive sleep apnea) 08/07/2021   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   CVA (cerebral vascular accident) (HCC) 08/01/2021   GERD (gastroesophageal reflux disease) 07/20/2019   Advanced care planning/counseling discussion 12/17/2016   Skin lesions, generalized 11/13/2016   Chronic right hip pain 06/17/2016   Vitamin D deficiency 09/26/2015   Diastasis recti 08/08/2015  Elevated serum GGT level 07/24/2015   Essential hypertension 07/17/2015   Fatty liver 07/17/2015   Elevated alkaline phosphatase level 07/17/2015   Elevated serum glutamic pyruvic transaminase (SGPT) level 07/17/2015   Abnormal finding on EKG 07/11/2015   Morbid obesity (HCC) 07/11/2015   Colon, diverticulosis 07/11/2015   Abdominal wall hernia 07/11/2015   DM (diabetes mellitus), type 2 with neurological complications (HCC)     Hyperlipidemia      ONSET DATE: 08/01/2021   REFERRING DIAG: CVA   THERAPY DIAG:  Muscle weakness (generalized)   Rationale for Evaluation and Treatment Rehabilitation   SUBJECTIVE:   SUBJECTIVE STATEMENT:       Pt reports doing better today.  Pt accompanied by: self   PERTINENT HISTORY:   Pt. is a 78 y.o. female who had a unexpectedly hospitalized from 2/12-2/14/2024 for COVID-19, and a UTI.  Pt. 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 Pt. attended inpatient rehab from 08/07/2021-08/22/2021. Pt. received Home health therapy services this past spring.  Pt. has recently had an assessment through driver rehabilitation services at [facility] with recommendations for referrals for outpatient OT/PT, and ST services. Pt. PMHx includes: HTN, Hyperlipidemia, Macular degeneration, DM, and Obesity.   PRECAUTIONS: None   WEIGHT BEARING RESTRICTIONS No   PAIN: Are you having pain? No reports of pain  FALLS: Has patient fallen in last 6 months? Yes.   LIVING ENVIRONMENT: Lives with: lives with their family  Son  [name] Lives in: House/apartment Main living are on one floor Stairs:  2 steps to enter Has following equipment at home: Single point cane, Wheelchair (manual), shower chair, Grab bars, bed rail, and rubber mat.   PLOF: Independent   PATIENT GOALS: To be able to Drive, and cook again   OBJECTIVE:    HAND DOMINANCE: Right    ADLs:  Transfers/ambulation related to ADLs: Independent Eating: Independent Grooming: [employer] Pt. reports being unable to use her left hand to perform haircare.  UB Dressing: Independent pullover shirt, independent now with fastening a bra LB Dressing: Independent donning pants socks, and slide on shoes Toileting: Independent Bathing: Independent Tub Shower transfers: Walk-in shower Independent now    IADLs: Shopping: Needs to be accompanied to the grocery store. Light housekeeping: Do  Meal Prep:  Is able to perform light meal prep Community mobility: Relies on son, and friend Medication management: Son sets up Mining engineer management:TBD Handwriting: TBD   MOBILITY STATUS: Hx of falls out of bed     ACTIVITY TOLERANCE: Activity tolerance:  10-20 min. Before rest break    FUNCTIONAL OUTCOME MEASURES: FOTO: 61  TR score: 62  10/09/2022: FOTO: 49  (Recert) 06/02/23: 65  07/28/2023:  FOTO score: 62   UPPER EXTREMITY ROM      Active ROM Right eval Left eval Left  10/09/2022 Left 10/23/22 Left 12/02/2022 Left 01/13/2023 Left  03/10/2023 Left 04/21/23 Left 05/12/23 Left 06/02/23 Left 06/16/23 Left 07/28/23  Shoulder flexion WFL 54(74) scaption 0(55) 26(80) 32(82) 35(82) 35(85) 40(87) 70(96) 77 (104) 80(108) 83(108)  Shoulder abduction WFL 58(75) 32(54) 32(50) 45(60) 47(70) 62(72) 48(76) 73(102) 70 (100) 76(106) 80(110)  Shoulder adduction                Shoulder extension                Shoulder internal rotation            Thumb to L3 (T10) Thumb to L3 (T10) Thumb L3 (T10)  Shoulder  external rotation            (With elbow at side) 45 (60) (With elbow at side) 47 (60) (With elbow at side) 47  Elbow flexion [facility] WFL 145 145 145 145 145 147  WFL WFL WFL  Elbow extension [facility] WFL 10(0) 10(0) WFL WFL [facility] WFL  Wrist flexion WFL TBD [facility]  [facility] WFL WFL  Wrist extension WFL TBD [facility] WFL [facility] WFL WFL  Wrist ulnar deviation                Wrist radial deviation                Wrist pronation             [facility] WFL  Wrist supination             WFL WFL  (Blank rows = not tested)     UPPER EXTREMITY MMT:      MMT Right eval Left eval Left 10/09/2022 Left 10/23/2022 Left 12/02/2022 Left 01/13/2023 Left 03/10/2023 Left 04/21/23 Left 06/02/23 Left 06/16/23 Left 07/28/23  Shoulder flexion 4+/5 2+/5 1/5 2-/5 2-/5 2-/5 2-/5 2-/5 2- 2+/5 3-/5  Shoulder abduction 4+/5 2+/5 2-/5 2-/5 2-/5 2-/5 2-/5 2-/5 2- 2+/5 3-/5  Shoulder adduction               Shoulder extension               Shoulder internal rotation           4- 4-/5   Shoulder external rotation           3- 3-/5   Middle trapezius               Lower trapezius               Elbow flexion 4+/5 3+/5 3/5 3/5 3+/5 3+/5 4/5 4/5 4+/5 4+/5 4+/5  Elbow extension  4+/5 3+/5 3/5 3/5 3+/5 3+/5 4/5 4/5 4/5 4+/5 4+/5  Wrist flexion 4+/5 TBD 3/5 3/5 3+/5 3+/5 4/5 4/5 4+/5 4+/5 4+/5  Wrist extension 4+/5 TBD 3/5 3/5 3+/5 3+/5 4/5 4/5 4/5 4/5 4+/5  Wrist ulnar deviation               Wrist radial deviation               Wrist pronation               Wrist supination               (Blank rows = not tested)   HAND FUNCTION: Grip strength: Right: 33 lbs; Left: 11 lbs, Lateral pinch: Right: 17 lbs, Left: 2 lbs, and 3 point pinch: Right: 17 lbs, Left: 2 lbs  10/09/2022: Grip strength: Right: 33 lbs; Left: 10 lbs, Lateral pinch: Right: 17 lbs, Left: 1 lbs, and 3 point pinch: Right: 17 lbs, Left: 1 lbs   10/23/2022: Grip strength: Right: 33 lbs; Left: 16 lbs, Lateral pinch: Right: 17 lbs, Left: 1 lbs, and 3 point pinch: Right: 17 lbs, Left: 1 lbs  12/02/2022: Grip strength: Right: 33 lbs; Left: 17 lbs, Lateral pinch: Right: 17 lbs, Left: 7 lbs, and 3 point pinch: Right: 17 lbs, Left: 3 lbs   01/13/2023: Grip strength: Right: 33 lbs; Left: 15 lbs, Lateral pinch: Right: 17 lbs, Left: 9 lbs, and 3 point pinch: Right: 17 lbs, Left: 7 lbs  03/10/2023: Grip strength: Right: 33 lbs; Left: 15 lbs, Lateral pinch: Right: 17 lbs, Left: 9 lbs, and 3 point pinch: Right: 17 lbs, Left: 7 lbs   04/21/2023: Grip strength: Right: 34 lbs; Left: 13 lbs, Lateral pinch: NT 3 point pinch: Right: NT  06/02/23: Grip strength: Right: 34 lbs; Left: 19 lbs, Lateral pinch: Right: 16 lbs; Left: 6 lbs; 3 point pinch: Right: 13 lbs, Left: 7 lbs   06/16/2023: Grip strength: Right: 39 lbs; Left: 19 lbs, Lateral pinch: Right: 16 lbs; Left: 5 lbs; 3 point pinch: Right: 13 lbs, Left: 9 lbs   07/28/2023: Grip strength: Right: 39 lbs; Left: 20 lbs, Lateral pinch: Right: 16 lbs; Left: 7 lbs; 3 point pinch: Right: 13 lbs, Left: 9 lbs         COORDINATION:   9 Hole Peg test: Right: 27 sec.  sec; Left: 52 sec.  10/09/2022: 9 Hole Peg test: Right: 27 sec.  sec; Left: 1 min. & 35 sec.  01/13/2023: 9  Hole Peg test: Right: 27 sec.  sec; Left: 51. Sec.  03/10/2023: 9 Hole Peg test: Right: 27 sec.  sec; Left: 54 Sec.  04/21/2023: 9 Hole Peg test: Right: 27 sec.  sec; Left: 46 Sec.  06/02/23: 9 hole Peg test: Left: 47 sec  06/15/23: 9 hole Peg test: Left: 45 sec  07/28/2023: 9 hole Peg test: Left: 44 sec   SENSATION: Light touch: WFL Proprioception: WFL   COGNITION: Overall cognitive status: Within functional limits for tasks assessed   VISION: Subjective report: Glasses. Just received a new prescription to increase bifocal strength Baseline vision: Macular Degeneration Visual history: macular degeneration   VISION ASSESSMENT: To be further assessed in functional context  Left sided Awareness     PERCEPTION: Limited left sided awareness   TODAY'S TREATMENT:   There. Ex.:   Pt. worked on the [employer] for 10 min. with constant monitoring of the BUE on level 4.0 Pt. performed left shoulder reps using the vertical finger ladder to progressively increase shoulder flexion.  Therapeutic Ex.:  Pt. worked on functional reaching with the LUE moving magnets across a vertical, horizontal, and diagonal planes on a vertical whiteboard in preparation for using her LUE to place items on a shelf.           PATIENT EDUCATION: Education details: UE strengthening, FMC, functional reach Person educated: Patient Education method: verbal cues Education comprehension: verbalized understanding    HOME EXERCISE PROGRAM:  Reviewed home activities to enhance left hand digit extension during typing skills.   GOALS: Goals reviewed with patient? Yes     SHORT TERM GOALS: Target date: 07/21/23   Pt. Will improve FOTO score by 2 points to reflect improved pt. perceived functional performance Baseline: 07/28/2023: FOTO score: 62, 06/16/2023: FOTO score: 70 06/02/23: 65; 04/21/2023: 49 03/10/2023: 59 01/13/2023: 49 12/01/2022: FOTO: 51 10/23/2022: FOTO 49 10/09/2022: FOTO 49, FOTO: 61 TR score:  62 Goal status: achieved/ongoing   LONG TERM GOALS: Target date: 08/25/2023   Pt. Will improve left shoulder ROM by 10 degrees to be able able to efficiently apply deodorant Baseline: 07/28/2023: shoulder flexion 83(108), abd 80(110)  06/16/23: shoulder flexion 80 (108), abd 76(106) Pt. Is able to apply deodorant to the left, however takes increased time.; 06/02/23: shoulder flexion 77 (104), abd 70 (100); 04/21/2023: shoulder flexion: 40(87), Abduction: 48(76) 03/10/2023: Shoulder flexion: 35(85) 01/13/2023:  Shoulder flexion: 35(82), Abduction: 47(70) 12/02/2022: Shoulder flexion: 32(82), Abduction: 45(60)  10/23/2022: Shoulder flexion: 26(80), Abduction: 32(50) 10/09/2022: flexion: 0(55),  Abduction: 32(54) Goal status: Improving, ongoing   2.  Pt. will be able to independently hold and use a blow dryer, and brush for hair care. Baseline: 07/28/2023: Pt can hold a blow dryer, and is able to move the blow dryer within her hand, however reaches only to L side of head and lower part of back of head with brush and blow dryer d/t limited ER ROM and strength11/26/2024: shoulder flexion 80 (108), abd 76(106) 06/02/23: Pt can hold a blow dryer but reaches only to L side of head and lower part of back of head with brush and blow dryer d/t limited ER ROM and strength; 04/21/2023: Pt. is able to hold the hair dryer in the left hand, however is unable to reach up to dry it, and has difficulty using a brush with the left UE. 03/10/2023: Pt. continues to be able to hold the hair dryer in the left hand, however is unable to reach up to dry it, and has difficulty using a brush 01/13/2023: Pt. Can hold the hair dryer in the left hand, however is unable to reach up to dry it, and has difficulty using a brush. 12/02/2022: Pt. is unable to actively elevate he left shoulder in order to blow dry her hair. 10/23/2022: Pt. continues to be unable to hold a blow dryer. Eval: Pt. Is unable to sustain her BUEs in elevation, and use a blow  dryer, and brush. 10/09/2022: Pt. Is unable to hold a blow dryer. Eval: Pt. Is unable to sustain her BUEs in elevation, and use a blow dryer, and brush. Goal status: improving/ongoing   3.  Pt. Will improve left lateral pinch strength by 3# to be able to independently cut meat. Baseline:07/28/2023:  Lateral pinch: Left: 7 lbs.06/16/2023: Lateral pinch: Left: 5 lbs. Pt. Has difficulty cutting steak, however is able to cut tender pieces of chicken. 06/02/23: L lateral pinch: 6# (pt reports she lacks the strength to cut steak but can cut tender chicken); 04/21/2023: NT(Pinch Meter sent out for calibration) Pt. continues to have difficulty cutting meat. 03/10/2023: Left lateral pinch strength: 9#   01/13/2023: Left lateral pinch strength: 9#  12/02/2022: Left pinch: 7# Pt. has difficulty cutting meat. 10/23/2022: Pt. Continues to present with difficulty cutting meat. 10/09/2022: 1#  Eval: 2#. Pt. has difficulty stabilizing utensil, and food while cutting food. Goal status: Ongoing   4.  Pt. Will improve Left hand [facility] skills to be able to be able to independently, and efficiently manipulate small objects during ADL tasks. Baseline: 1/0/2025: Left FMC skills: 44 sec. 06/16/2023: Left 45 sec. 06/02/2023: L 47 sec; 04/21/2023:  46 sec. 03/10/2023: 54 sec. 01/13/2023: 51 sec. 12/02/2022: TBD  10/23/2022: Pt. Continues to present with difficulty manipulating small objects. 10/09/2022: Left: 1 min & 35 sec.: Eval: Left: 52 sec. Right: 27 sec. Goal status: Improving, Ongoing    6.: Pt. will improve with left grip strength to be able to hold and pull up pants Baseline: 07/28/2023: Left grip strength: 20# 06/16/2023: Left grip strength: 19# Pt. Is now able to pull her pants up.06/02/23: L grip 19#, improving; 04/21/2023: Pt. is able to pull up covers, however has difficulty pulling up pants. 03/10/2023: left  grip strength: 15# 01/13/2023: left  grip strength: 15# 12/02/2022: 17# 10/23/2022: Left: 16# 10/09/2022: Left: 10# Eval:  Left  grip strength 11#. Pt. has difficulty holding a full drink  securely with the left hand.             Goal status: Improved  7. Pt. will improve typing speed, and accuracy in preparation for efficiently typing simple email message. Baseline: 07/28/2023: Pt. now has a working laptop available for her to use. 06/16/2023:  Limited ability to carry-over practice at home due to Pt.'s mouse at home is not working. Pt. Also can not see the keys on her computer at home. 06/02/23: same as 04/21/23; 04/21/2023:  Pt. continues to present with limited speed, and accuracy with typing  03/10/2023: Pt. continues to present with limited speed, and accuracy with typing 01/13/2023: TBD 12/02/2022:  Pt. continues to present with limited speed, and accuracy with typing.10/23/2022: Pt. continues to present with limited speed, and accuracy with typing. 10/09/2022: Pt. presents with difficulty typing. Pt. presents with difficulty typing an email. Typing speed, and accuracy TBD             Goal status:  Resume goal                                CLINICAL IMPRESSION:  Pt. was late to the session today. Pt. reports that her headache is feeling better today. Pt. continues to present with limited LUE ROM, however was able to sustain LUE in elevation  while moving the magnets through various vertical, horizontal, and diagonal planes on a vertical whiteboard. Pt. required assist proximally to sustain the LUE in elevation during the reaching task. Proximal support was tapered as needed. Pt. continues to benefit from OT services to work towards improving Left UE functioning in order to maximize engagement in, and overall independence with ADLs, and IADL tasks, while reducing risk of adhesive capsulitis.     PERFORMANCE DEFICITS in functional skills including ADLs, IADLs, coordination, dexterity, sensation, ROM, strength, FMC, decreased knowledge of use of DME, vision, and UE functional use, cognitive skills including attention, and  psychosocial skills including coping strategies, environmental adaptation, habits, and routines and behaviors.    IMPAIRMENTS are limiting patient from ADLs, IADLs, and social participation.    COMORBIDITIES may have co-morbidities  that affects occupational performance. Patient will benefit from skilled OT to address above impairments and improve overall function.   MODIFICATION OR ASSISTANCE TO COMPLETE EVALUATION: Min-Moderate modification of tasks or assist with assess necessary to complete an evaluation.   OT OCCUPATIONAL PROFILE AND HISTORY: Detailed assessment: Review of records and additional review of physical, cognitive, psychosocial history related to current functional performance.   CLINICAL DECISION MAKING: Moderate - several treatment options, min-mod task modification necessary   REHAB POTENTIAL: Good   EVALUATION COMPLEXITY: Moderate      PLAN: OT FREQUENCY: 2x/week   OT DURATION: 12 weeks   PLANNED INTERVENTIONS: self care/ADL training, therapeutic exercise, therapeutic activity, neuromuscular re-education, manual therapy, passive range of motion, and paraffin   RECOMMENDED OTHER SERVICES:    CONSULTED AND AGREED WITH PLAN OF CARE: Patient   PLAN FOR NEXT SESSION:  Left hand strengthening and coordination exercises  Olegario Messier, MS, OTR/L    08/20/23, 2:54 PM

## 2023-08-25 ENCOUNTER — Ambulatory Visit: Payer: Medicare HMO | Attending: Adult Health | Admitting: Occupational Therapy

## 2023-08-25 DIAGNOSIS — M6281 Muscle weakness (generalized): Secondary | ICD-10-CM | POA: Diagnosis present

## 2023-08-25 DIAGNOSIS — R278 Other lack of coordination: Secondary | ICD-10-CM | POA: Diagnosis present

## 2023-08-25 NOTE — Therapy (Signed)
 OCCUPATIONAL THERAPY TREATMENT/RECERTIFICATION NOTE   Patient Name: Alyssa Mayo MRN: [id number] DOB:04/18/1946, 78 y.o., female Today's Date: 03/14/2022   PCP: Lynwood Null, MD REFERRING PROVIDER: Harlene Bogaert, NP     OT End of Session - 08/25/23 1325     Visit Number 56    Number of Visits 81    Date for OT Re-Evaluation 11/17/23    OT Start Time 1100    OT Stop Time 1145    OT Time Calculation (min) 45 min    Activity Tolerance Patient tolerated treatment well    Behavior During Therapy [facility] for tasks assessed/performed                          Past Medical History:  Diagnosis Date   Abnormal levels of other serum enzymes 07/17/2015   Arthritis     Constipation 07/11/2015   COVID-19 03/06/2021   Diabetes mellitus without complication (HCC)     Elevated liver enzymes     Fatty liver     Generalized abdominal pain 07/11/2015   Hyperlipidemia     Hypertension     Lifelong obesity     Macular degeneration     Morbid obesity due to excess calories (HCC) 07/11/2015   Other fatigue 07/11/2015   Sleep apnea     Spinal headache           Past Surgical History:  Procedure Laterality Date   ABDOMINAL HYSTERECTOMY       APPENDECTOMY       CATARACT EXTRACTION       CERVICAL LAMINECTOMY   07/21/1985   CESAREAN SECTION       COLONOSCOPY   07/21/2012    diverticulosis, [facility] Dr. Luellen    COLONOSCOPY WITH PROPOFOL  N/A 02/04/2019    Procedure: COLONOSCOPY WITH PROPOFOL ;  Surgeon: Gaylyn Gladis PENNER, MD;  Location: [facility] ENDOSCOPY;  Service: Endoscopy;  Laterality: N/A;   COLONOSCOPY WITH PROPOFOL  N/A 03/18/2021    Procedure: COLONOSCOPY WITH PROPOFOL ;  Surgeon: Maryruth Ole DASEN, MD;  Location: [facility] ENDOSCOPY;  Service: Endoscopy;  Laterality: N/A;  DM   DIAGNOSTIC LAPAROSCOPY       ESOPHAGOGASTRODUODENOSCOPY (EGD) WITH PROPOFOL  N/A 02/04/2019    Procedure: ESOPHAGOGASTRODUODENOSCOPY (EGD) WITH PROPOFOL ;  Surgeon: Gaylyn Gladis PENNER, MD;  Location: [facility]  ENDOSCOPY;  Service: Endoscopy;  Laterality: N/A;   ESOPHAGOGASTRODUODENOSCOPY (EGD) WITH PROPOFOL  N/A 03/18/2021    Procedure: ESOPHAGOGASTRODUODENOSCOPY (EGD) WITH PROPOFOL ;  Surgeon: Maryruth Ole DASEN, MD;  Location: [facility] ENDOSCOPY;  Service: Endoscopy;  Laterality: N/A;   LOOP RECORDER INSERTION N/A 08/05/2021    Procedure: LOOP RECORDER INSERTION;  Surgeon: Waddell Danelle ORN, MD;  Location: [facility] INVASIVE CV LAB;  Service: Cardiovascular;  Laterality: N/A;   SPINE SURGERY       TUBAL LIGATION            Patient Active Problem List    Diagnosis Date Noted   Paroxysmal atrial fibrillation (HCC) 08/08/2022   Hypercoagulable state due to paroxysmal atrial fibrillation (HCC) 08/08/2022   Cerebral edema (HCC) 08/07/2021   OSA (obstructive sleep apnea) 08/07/2021   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   CVA (cerebral vascular accident) (HCC) 08/01/2021   GERD (gastroesophageal reflux disease) 07/20/2019   Advanced care planning/counseling discussion 12/17/2016   Skin lesions, generalized 11/13/2016   Chronic right hip pain 06/17/2016   Vitamin D  deficiency 09/26/2015   Diastasis recti 08/08/2015   Elevated serum GGT level 07/24/2015   Essential hypertension  07/17/2015   Fatty liver 07/17/2015   Elevated alkaline phosphatase level 07/17/2015   Elevated serum glutamic pyruvic transaminase (SGPT) level 07/17/2015   Abnormal finding on EKG 07/11/2015   Morbid obesity (HCC) 07/11/2015   Colon, diverticulosis 07/11/2015   Abdominal wall hernia 07/11/2015   DM (diabetes mellitus), type 2 with neurological complications (HCC)     Hyperlipidemia      ONSET DATE: 08/01/2021   REFERRING DIAG: CVA   THERAPY DIAG:  Muscle weakness (generalized)   Rationale for Evaluation and Treatment Rehabilitation   SUBJECTIVE:   SUBJECTIVE STATEMENT:       Pt reports doing better today.  Pt accompanied by: self   PERTINENT HISTORY:  Pt. is a 78 y.o. female who had a unexpectedly  hospitalized from 2/12-2/14/2024 for COVID-19, and a UTI.  Pt. 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 Pt. attended inpatient rehab from 08/07/2021-08/22/2021. Pt. received Home health therapy services this past spring.  Pt. has recently had an assessment through driver rehabilitation services at [facility] with recommendations for referrals for outpatient OT/PT, and ST services. Pt. PMHx includes: HTN, Hyperlipidemia, Macular degeneration, DM, and Obesity.   PRECAUTIONS: None   WEIGHT BEARING RESTRICTIONS No   PAIN: Are you having pain? No reports of pain  FALLS: Has patient fallen in last 6 months? Yes.   LIVING ENVIRONMENT: Lives with: lives with their family  Son  [name] Lives in: House/apartment Main living are on one floor Stairs:  2 steps to enter Has following equipment at home: Single point cane, Wheelchair (manual), shower chair, Grab bars, bed rail, and rubber mat.   PLOF: Independent   PATIENT GOALS: To be able to Drive, and cook again   OBJECTIVE:    HAND DOMINANCE: Right    ADLs:  Transfers/ambulation related to ADLs: Independent Eating: Independent Grooming: [employer] Pt. reports being unable to use her left hand to perform haircare.  UB Dressing: Independent pullover shirt, independent now with fastening a bra LB Dressing: Independent donning pants socks, and slide on shoes Toileting: Independent Bathing: Independent Tub Shower transfers: Walk-in shower Independent now    IADLs: Shopping: Needs to be accompanied to the grocery store. Light housekeeping: Do  Meal Prep:  Is able to perform light meal prep Community mobility: Relies on son, and friend Medication management: Son sets up Mining Engineer management:TBD Handwriting: TBD   MOBILITY STATUS: Hx of falls out of bed     ACTIVITY TOLERANCE: Activity tolerance:  10-20 min. Before rest break   FUNCTIONAL OUTCOME MEASURES: FOTO: 61  TR score:  62  10/09/2022: FOTO: 49  (Recert) 06/02/23: 65  07/28/2023:  FOTO score: 62   UPPER EXTREMITY ROM      Active ROM Right eval Left eval Left  10/09/2022 Left 10/23/22 Left 12/02/2022 Left 01/13/2023 Left  03/10/2023 Left 04/21/23 Left 05/12/23 Left 06/02/23 Left 06/16/23 Left 07/28/23 Left 08/25/23  Shoulder flexion WFL 54(74) scaption 0(55) 26(80) 32(82) 35(82) 35(85) 40(87) 70(96) 77 (104) 80(108) 83(108) 85(108)  Shoulder abduction WFL 58(75) 32(54) 32(50) 45(60) 47(70) 62(72) 48(76) 73(102) 70 (100) 76(106) 80(110) 84(110)  Shoulder adduction                 Shoulder extension                 Shoulder internal rotation            Thumb to L3 (T10) Thumb to L3 (T10) Thumb L3 (T10)   Shoulder external rotation            (  With elbow at side) 45 (60) (With elbow at side) 47 (60) (With elbow at side) 47 With elbow at side) 48  Elbow flexion [facility] WFL 145 145 145 145 145 147  WFL WFL WFL WFL  Elbow extension [facility] WFL 10(0) 10(0) WFL WFL [facility]  [facility] WFL  Wrist flexion WFL TBD [facility]  [facility] WFL WFL WFL  Wrist extension WFL TBD [facility] WFL [facility]  [facility] WFL  Wrist ulnar deviation                 Wrist radial deviation                 Wrist pronation             [facility] WFL  Wrist supination             [address] WFL WFL  (Blank rows = not tested)     UPPER EXTREMITY MMT:      MMT Right eval Left eval Left 10/09/2022 Left 10/23/2022 Left 12/02/2022 Left 01/13/2023 Left 03/10/2023 Left 04/21/23 Left 06/02/23 Left 06/16/23 Left 07/28/23 Left 08/25/23  Shoulder flexion 4+/5 2+/5 1/5 2-/5 2-/5 2-/5 2-/5 2-/5 2- 2+/5 3-/5 3-/5  Shoulder abduction 4+/5 2+/5 2-/5 2-/5 2-/5 2-/5 2-/5 2-/5 2- 2+/5 3-/5 3-/5  Shoulder adduction                Shoulder extension                Shoulder internal rotation           4- 4-/5  4/5  Shoulder external rotation           3- 3-/5  3-/5  Middle trapezius                Lower trapezius                Elbow flexion 4+/5  3+/5 3/5 3/5 3+/5 3+/5 4/5 4/5 4+/5 4+/5 4+/5 5/5  Elbow extension 4+/5 3+/5 3/5 3/5 3+/5 3+/5 4/5 4/5 4/5 4+/5 4+/5 4+/5  Wrist flexion 4+/5 TBD 3/5 3/5 3+/5 3+/5 4/5 4/5 4+/5 4+/5 4+/5 4+/5  Wrist extension 4+/5 TBD 3/5 3/5 3+/5 3+/5 4/5 4/5 4/5 4/5 4+/5 4+/5  Wrist ulnar deviation                Wrist radial deviation                Wrist pronation                Wrist supination                (Blank rows = not tested)   HAND FUNCTION: Grip strength: Right: 33 lbs; Left: 11 lbs, Lateral pinch: Right: 17 lbs, Left: 2 lbs, and 3 point pinch: Right: 17 lbs, Left: 2 lbs  10/09/2022: Grip strength: Right: 33 lbs; Left: 10 lbs, Lateral pinch: Right: 17 lbs, Left: 1 lbs, and 3 point pinch: Right: 17 lbs, Left: 1 lbs   10/23/2022: Grip strength: Right: 33 lbs; Left: 16 lbs, Lateral pinch: Right: 17 lbs, Left: 1 lbs, and 3 point pinch: Right: 17 lbs, Left: 1 lbs  12/02/2022: Grip strength: Right: 33 lbs; Left: 17 lbs, Lateral pinch: Right: 17 lbs, Left: 7 lbs, and 3 point pinch: Right: 17 lbs, Left: 3 lbs   01/13/2023: Grip strength: Right: 33 lbs; Left: 15 lbs,  Lateral pinch: Right: 17 lbs, Left: 9 lbs, and 3 point pinch: Right: 17 lbs, Left: 7 lbs  03/10/2023: Grip strength: Right: 33 lbs; Left: 15 lbs, Lateral pinch: Right: 17 lbs, Left: 9 lbs, and 3 point pinch: Right: 17 lbs, Left: 7 lbs   04/21/2023: Grip strength: Right: 34 lbs; Left: 13 lbs, Lateral pinch: NT 3 point pinch: Right: NT  06/02/23: Grip strength: Right: 34 lbs; Left: 19 lbs, Lateral pinch: Right: 16 lbs; Left: 6 lbs; 3 point pinch: Right: 13 lbs, Left: 7 lbs   06/16/2023: Grip strength: Right: 39 lbs; Left: 19 lbs, Lateral pinch: Right: 16 lbs; Left: 5 lbs; 3 point pinch: Right: 13 lbs, Left: 9 lbs   07/28/2023: Grip strength: Right: 39 lbs; Left: 20 lbs, Lateral pinch: Right: 16 lbs; Left: 7 lbs; 3 point pinch: Right: 13 lbs, Left: 9 lbs    08/25/2023: Grip strength: Right: 39 lbs; Left: 20 lbs, Lateral pinch: Right: 16 lbs;  Left: 7 lbs; 3 point pinch: Right: 13 lbs, Left: 9 lbs             COORDINATION:   9 Hole Peg test: Right: 27 sec.  sec; Left: 52 sec.  10/09/2022: 9 Hole Peg test: Right: 27 sec.  sec; Left: 1 min. & 35 sec.  01/13/2023: 9 Hole Peg test: Right: 27 sec.  sec; Left: 51. Sec.  03/10/2023: 9 Hole Peg test: Right: 27 sec.  sec; Left: 54 Sec.  04/21/2023: 9 Hole Peg test: Right: 27 sec.  sec; Left: 46 Sec.  06/02/23: 9 hole Peg test: Left: 47 sec  06/15/23: 9 hole Peg test: Left: 45 sec  07/28/2023: 9 hole Peg test: Left: 44 sec  08/25/2023: 9 hole Peg test: Left: 39 sec     SENSATION: Light touch: WFL Proprioception: WFL   COGNITION: Overall cognitive status: Within functional limits for tasks assessed   VISION: Subjective report: Glasses. Just received a new prescription to increase bifocal strength Baseline vision: Macular Degeneration Visual history: macular degeneration   VISION ASSESSMENT: To be further assessed in functional context  Left sided Awareness     PERCEPTION: Limited left sided awareness   TODAY'S TREATMENT:   There. Ex.:   Pt. worked on the [employer] for 8 min. with constant monitoring of the BUE on level 4.0 Pt. performed left shoulder reps using the vertical finger ladder to progressively increase shoulder flexion.  Measurements were obtained, and goals were reviewed with the Pt.          PATIENT EDUCATION: Education details: UE strengthening, FMC, functional reach Person educated: Patient Education method: verbal cues Education comprehension: verbalized understanding    HOME EXERCISE PROGRAM:  Reviewed home activities to enhance left hand digit extension during typing skills.   GOALS: Goals reviewed with patient? Yes     SHORT TERM GOALS: Target date: 07/21/23   Pt. Will improve FOTO score by 2 points to reflect improved pt. perceived functional performance Baseline: 07/28/2023: FOTO score: 62, 06/16/2023: FOTO score: 70 06/02/23:  65; 04/21/2023: 49 03/10/2023: 59 01/13/2023: 49 12/01/2022: FOTO: 51 10/23/2022: FOTO 49 10/09/2022: FOTO 49, FOTO: 61 TR score: 62 Goal status:  Goal discharged   LONG TERM GOALS: Target date: 11/17/2023   Pt. Will improve left shoulder ROM by 10 degrees to be able able to efficiently apply deodorant Baseline: 08/25/23: shoulder flexion 85(108), abd 84(110)  07/28/2023: shoulder flexion 83(108), abd 80(110)  06/16/23: shoulder flexion 80 (108), abd 76(106) Pt. Is able to apply deodorant to the left,  however takes increased time.; 06/02/23: shoulder flexion 77 (104), abd 70 (100); 04/21/2023: shoulder flexion: 40(87), Abduction: 48(76) 03/10/2023: Shoulder flexion: 35(85) 01/13/2023:  Shoulder flexion: 35(82), Abduction: 47(70) 12/02/2022: Shoulder flexion: 32(82), Abduction: 45(60)  10/23/2022: Shoulder flexion: 26(80), Abduction: 32(50) 10/09/2022: flexion: 0(55), Abduction: 32(54) Goal status: Improving, ongoing   2.  Pt. will be able to independently hold and use a blow dryer, and brush for hair care. Baseline: 08/25/23: Pt. Continues to be able to hold a blow dryer, and is able to move the blow dryer within her hand more efficiently, however reaches only to L side of head and lower part of back of head with brush and blow dryer d/t limited ER ROM and strength 07/28/2023: Pt can hold a blow dryer, and is able to move the blow dryer within her hand, however reaches only to L side of head and lower part of back of head with brush and blow dryer d/t limited ER ROM and strength11/26/2024: shoulder flexion 80 (108), abd 76(106) 06/02/23: Pt can hold a blow dryer but reaches only to L side of head and lower part of back of head with brush and blow dryer d/t limited ER ROM and strength; 04/21/2023: Pt. is able to hold the hair dryer in the left hand, however is unable to reach up to dry it, and has difficulty using a brush with the left UE. 03/10/2023: Pt. continues to be able to hold the hair dryer in the left hand, however  is unable to reach up to dry it, and has difficulty using a brush 01/13/2023: Pt. Can hold the hair dryer in the left hand, however is unable to reach up to dry it, and has difficulty using a brush. 12/02/2022: Pt. is unable to actively elevate he left shoulder in order to blow dry her hair. 10/23/2022: Pt. continues to be unable to hold a blow dryer. Eval: Pt. Is unable to sustain her BUEs in elevation, and use a blow dryer, and brush. 10/09/2022: Pt. Is unable to hold a blow dryer. Eval: Pt. Is unable to sustain her BUEs in elevation, and use a blow dryer, and brush. Goal status: improving/ongoing   3.  Pt. Will improve left lateral pinch strength by 3# to be able to independently cut meat. Baseline: 08/25/2023: Lateral Pinch strength: 7# 07/28/2023:  Lateral pinch: Left: 7 lbs.06/16/2023: Lateral pinch: Left: 5 lbs. Pt. Has difficulty cutting steak, however is able to cut tender pieces of chicken. 06/02/23: L lateral pinch: 6# (pt reports she lacks the strength to cut steak but can cut tender chicken); 04/21/2023: NT(Pinch Meter sent out for calibration) Pt. continues to have difficulty cutting meat. 03/10/2023: Left lateral pinch strength: 9#   01/13/2023: Left lateral pinch strength: 9#  12/02/2022: Left pinch: 7# Pt. has difficulty cutting meat. 10/23/2022: Pt. Continues to present with difficulty cutting meat. 10/09/2022: 1#  Eval: 2#. Pt. has difficulty stabilizing utensil, and food while cutting food. Goal status: Discontinued   4.  Pt. Will improve Left hand [facility] skills to be able to be able to independently, and efficiently manipulate small objects during ADL tasks. Baseline: 08/25/2023: 39 sec. 1/0/2025: Left FMC skills: 44 sec. 06/16/2023: Left 45 sec. 06/02/2023: L 47 sec; 04/21/2023:  46 sec. 03/10/2023: 54 sec. 01/13/2023: 51 sec. 12/02/2022: TBD  10/23/2022: Pt. Continues to present with difficulty manipulating small objects. 10/09/2022: Left: 1 min & 35 sec.: Eval: Left: 52 sec. Right: 27 sec. Goal status:  Improving, Ongoing    6.: Pt. will improve with  left grip strength to be able to hold and pull up pants Baseline: 08/25/23: Left  grip strength 20# independent hiking pants. 07/28/2023: Left grip strength: 20# 06/16/2023: Left grip strength: 19# Pt. Is now able to pull her pants up.06/02/23: L grip 19#, improving; 04/21/2023: Pt. is able to pull up covers, however has difficulty pulling up pants. 03/10/2023: left  grip strength: 15# 01/13/2023: left  grip strength: 15# 12/02/2022: 17# 10/23/2022: Left: 16# 10/09/2022: Left: 10# Eval:  Left grip strength 11#. Pt. has difficulty holding a full drink  securely with the left hand.             Goal status: Partially met. Discontinue   7. Pt. will improve typing speed, and accuracy in preparation for efficiently typing simple email message. Baseline: 08/25/2023: TBD 07/28/2023: Pt. now has a working laptop available for her to use. 06/16/2023:  Limited ability to carry-over practice at home due to Pt.'s mouse at home is not working. Pt. Also can not see the keys on her computer at home. 06/02/23: same as 04/21/23; 04/21/2023:  Pt. continues to present with limited speed, and accuracy with typing  03/10/2023: Pt. continues to present with limited speed, and accuracy with typing 01/13/2023: TBD 12/02/2022:  Pt. continues to present with limited speed, and accuracy with typing.10/23/2022: Pt. continues to present with limited speed, and accuracy with typing. 10/09/2022: Pt. presents with difficulty typing. Pt. presents with difficulty typing an email. Typing speed, and accuracy TBD             Goal status: Ongoing                                CLINICAL IMPRESSION:  Pt. has made steady progress with left hand FMC, speed, and dexterity, has improved with right shoulder flexion, and abduction AROM over this recertification period. Strength in Left shoulder internal rotation, and elbow flexion has improved. Left grip strength, as well as lateral, and 3pt. pinch strength have  reached a plateau at this time, and goals have been modified accordingly. Plan to Change frequency from 2x's a week to 1x a week to address remaining goals as Pt. continues to benefit from OT services to work towards improving Left UE functioning in order to maximize engagement in, and overall independence with ADLs, and IADL tasks, while reducing risk of adhesive capsulitis.     PERFORMANCE DEFICITS in functional skills including ADLs, IADLs, coordination, dexterity, sensation, ROM, strength, FMC, decreased knowledge of use of DME, vision, and UE functional use, cognitive skills including attention, and psychosocial skills including coping strategies, environmental adaptation, habits, and routines and behaviors.    IMPAIRMENTS are limiting patient from ADLs, IADLs, and social participation.    COMORBIDITIES may have co-morbidities  that affects occupational performance. Patient will benefit from skilled OT to address above impairments and improve overall function.   MODIFICATION OR ASSISTANCE TO COMPLETE EVALUATION: Min-Moderate modification of tasks or assist with assess necessary to complete an evaluation.   OT OCCUPATIONAL PROFILE AND HISTORY: Detailed assessment: Review of records and additional review of physical, cognitive, psychosocial history related to current functional performance.   CLINICAL DECISION MAKING: Moderate - several treatment options, min-mod task modification necessary   REHAB POTENTIAL: Good   EVALUATION COMPLEXITY: Moderate      PLAN: OT FREQUENCY: 1x/week   OT DURATION: 12 weeks   PLANNED INTERVENTIONS: self care/ADL training, therapeutic exercise, therapeutic activity, neuromuscular re-education, manual therapy, passive range  of motion, and paraffin   RECOMMENDED OTHER SERVICES:    CONSULTED AND AGREED WITH PLAN OF CARE: Patient   PLAN FOR NEXT SESSION:  Left hand strengthening and coordination exercises  Richardson Otter, MS, OTR/L    08/25/23, 1:28 PM

## 2023-08-27 ENCOUNTER — Ambulatory Visit: Payer: Medicare HMO | Admitting: Occupational Therapy

## 2023-08-31 ENCOUNTER — Ambulatory Visit (INDEPENDENT_AMBULATORY_CARE_PROVIDER_SITE_OTHER): Payer: Medicare HMO

## 2023-08-31 DIAGNOSIS — I639 Cerebral infarction, unspecified: Secondary | ICD-10-CM | POA: Diagnosis not present

## 2023-08-31 LAB — CUP PACEART REMOTE DEVICE CHECK
Date Time Interrogation Session: 20250209231151
Implantable Pulse Generator Implant Date: 20230116

## 2023-09-01 ENCOUNTER — Ambulatory Visit: Payer: Medicare HMO | Admitting: Occupational Therapy

## 2023-09-01 DIAGNOSIS — M6281 Muscle weakness (generalized): Secondary | ICD-10-CM

## 2023-09-01 DIAGNOSIS — R278 Other lack of coordination: Secondary | ICD-10-CM

## 2023-09-01 NOTE — Therapy (Signed)
OCCUPATIONAL THERAPY NEURO TREATMENT NOTE   Patient Name: Alyssa Mayo MRN: [id number] DOB:1946-06-24, 78 y.o., female Today's Date: 03/14/2022   PCP: Alyssa Mina, MD REFERRING PROVIDER: [name], NP     OT End of Session - 09/01/23 1026     Visit Number [id number]    Number of Visits 81    Date for OT Re-Evaluation 11/17/23    OT Start Time 1015    OT Stop Time 1100    OT Time Calculation (min) 45 min    Activity Tolerance Patient tolerated treatment well    Behavior During Therapy [facility] for tasks assessed/performed                          Past Medical History:  Diagnosis Date   Abnormal levels of other serum enzymes 07/17/2015   Arthritis     Constipation 07/11/2015   COVID-19 03/06/2021   Diabetes mellitus without complication (HCC)     Elevated liver enzymes     Fatty liver     Generalized abdominal pain 07/11/2015   Hyperlipidemia     Hypertension     Lifelong obesity     Macular degeneration     Morbid obesity due to excess calories (HCC) 07/11/2015   Other fatigue 07/11/2015   Sleep apnea     Spinal headache           Past Surgical History:  Procedure Laterality Date   ABDOMINAL HYSTERECTOMY       APPENDECTOMY       CATARACT EXTRACTION       CERVICAL LAMINECTOMY   07/21/1985   CESAREAN SECTION       COLONOSCOPY   07/21/2012    diverticulosis, [facility] Dr. Ricki Mayo    COLONOSCOPY WITH PROPOFOL N/A 02/04/2019    Procedure: COLONOSCOPY WITH PROPOFOL;  Surgeon: Alyssa Deem, MD;  Location: [facility] ENDOSCOPY;  Service: Endoscopy;  Laterality: N/A;   COLONOSCOPY WITH PROPOFOL N/A 03/18/2021    Procedure: COLONOSCOPY WITH PROPOFOL;  Surgeon: Alyssa Bill, MD;  Location: [facility] ENDOSCOPY;  Service: Endoscopy;  Laterality: N/A;  DM   DIAGNOSTIC LAPAROSCOPY       ESOPHAGOGASTRODUODENOSCOPY (EGD) WITH PROPOFOL N/A 02/04/2019    Procedure: ESOPHAGOGASTRODUODENOSCOPY (EGD) WITH PROPOFOL;  Surgeon: Alyssa Deem, MD;  Location: [facility] ENDOSCOPY;   Service: Endoscopy;  Laterality: N/A;   ESOPHAGOGASTRODUODENOSCOPY (EGD) WITH PROPOFOL N/A 03/18/2021    Procedure: ESOPHAGOGASTRODUODENOSCOPY (EGD) WITH PROPOFOL;  Surgeon: Alyssa Bill, MD;  Location: [facility] ENDOSCOPY;  Service: Endoscopy;  Laterality: N/A;   LOOP RECORDER INSERTION N/A 08/05/2021    Procedure: LOOP RECORDER INSERTION;  Surgeon: Alyssa Maw, MD;  Location: [facility] INVASIVE CV LAB;  Service: Cardiovascular;  Laterality: N/A;   SPINE SURGERY       TUBAL LIGATION            Patient Active Problem List    Diagnosis Date Noted   Paroxysmal atrial fibrillation (HCC) 08/08/2022   Hypercoagulable state due to paroxysmal atrial fibrillation (HCC) 08/08/2022   Cerebral edema (HCC) 08/07/2021   OSA (obstructive sleep apnea) 08/07/2021   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   CVA (cerebral vascular accident) (HCC) 08/01/2021   GERD (gastroesophageal reflux disease) 07/20/2019   Advanced care planning/counseling discussion 12/17/2016   Skin lesions, generalized 11/13/2016   Chronic right hip pain 06/17/2016   Vitamin D deficiency 09/26/2015   Diastasis recti 08/08/2015   Elevated serum GGT level 07/24/2015   Essential  hypertension 07/17/2015   Fatty liver 07/17/2015   Elevated alkaline phosphatase level 07/17/2015   Elevated serum glutamic pyruvic transaminase (SGPT) level 07/17/2015   Abnormal finding on EKG 07/11/2015   Morbid obesity (HCC) 07/11/2015   Colon, diverticulosis 07/11/2015   Abdominal wall hernia 07/11/2015   DM (diabetes mellitus), type 2 with neurological complications (HCC)     Hyperlipidemia      ONSET DATE: 08/01/2021   REFERRING DIAG: CVA   THERAPY DIAG:  Muscle weakness (generalized)   Rationale for Evaluation and Treatment Rehabilitation   SUBJECTIVE:   SUBJECTIVE STATEMENT:       Pt reports having new onset of left hip pain, and now has to use her cane.  Pt accompanied by: self   PERTINENT HISTORY:  Pt. is a 78 y.o.  female who had a unexpectedly hospitalized from 2/12-2/14/2024 for COVID-19, and a UTI.  Pt. 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 Pt. attended inpatient rehab from 08/07/2021-08/22/2021. Pt. received Home health therapy services this past spring.  Pt. has recently had an assessment through driver rehabilitation services at [facility] with recommendations for referrals for outpatient OT/PT, and ST services. Pt. PMHx includes: HTN, Hyperlipidemia, Macular degeneration, DM, and Obesity.   PRECAUTIONS: None   WEIGHT BEARING RESTRICTIONS No   PAIN: Are you having pain? 1/10 in the right shoulder, and left hip. Pt. Reports they started bothering her yesterday,   FALLS: Has patient fallen in last 6 months? Yes.   LIVING ENVIRONMENT: Lives with: lives with their family  Son  Alyssa Mayo Lives in: House/apartment Main living are on one floor Stairs:  2 steps to enter Has following equipment at home: Single point cane, Wheelchair (manual), shower chair, Grab bars, bed rail, and rubber mat.   PLOF: Independent   PATIENT GOALS: To be able to Drive, and cook again   OBJECTIVE:    HAND DOMINANCE: Right    ADLs:  Transfers/ambulation related to ADLs: Independent Eating: Independent Grooming: [employer] Pt. reports being unable to use her left hand to perform haircare.  UB Dressing: Independent pullover shirt, independent now with fastening a bra LB Dressing: Independent donning pants socks, and slide on shoes Toileting: Independent Bathing: Independent Tub Shower transfers: Walk-in shower Independent now    IADLs: Shopping: Needs to be accompanied to the grocery store. Light housekeeping: Do  Meal Prep:  Is able to perform light meal prep Community mobility: Relies on son, and friend Medication management: Son sets up Mining engineer management:TBD Handwriting: TBD   MOBILITY STATUS: Hx of falls out of bed     ACTIVITY  TOLERANCE: Activity tolerance:  10-20 min. Before rest break   FUNCTIONAL OUTCOME MEASURES: FOTO: 61  TR score: 62  10/09/2022: FOTO: 49  (Recert) 06/02/23: 65  07/28/2023:  FOTO score: 62   UPPER EXTREMITY ROM      Active ROM Right eval Left eval Left  10/09/2022 Left 10/23/22 Left 12/02/2022 Left 01/13/2023 Left  03/10/2023 Left 04/21/23 Left 05/12/23 Left 06/02/23 Left 06/16/23 Left 07/28/23 Left 08/25/23  Shoulder flexion WFL 54(74) scaption 0(55) 26(80) 32(82) 35(82) 35(85) 40(87) 70(96) 77 (104) 80(108) 83(108) 85(108)  Shoulder abduction WFL 58(75) 32(54) 32(50) 45(60) 47(70) 62(72) 48(76) 73(102) 70 (100) 76(106) 80(110) 84(110)  Shoulder adduction                 Shoulder extension                 Shoulder internal rotation  Thumb to L3 (T10) Thumb to L3 (T10) Thumb L3 (T10)   Shoulder external rotation            (With elbow at side) 45 (60) (With elbow at side) 47 (60) (With elbow at side) 47 With elbow at side) 48  Elbow flexion [facility] WFL 145 [phone] 147  WFL WFL WFL WFL  Elbow extension [facility] WFL 10(0) 10(0) WFL WFL [facility]  [facility] WFL  Wrist flexion WFL TBD [facility]  [facility] WFL WFL WFL  Wrist extension WFL TBD [facility] WFL [facility]  [facility] WFL  Wrist ulnar deviation                 Wrist radial deviation                 Wrist pronation             [facility] WFL  Wrist supination             [facility] WFL WFL  (Blank rows = not tested)     UPPER EXTREMITY MMT:      MMT Right eval Left eval Left 10/09/2022 Left 10/23/2022 Left 12/02/2022 Left 01/13/2023 Left 03/10/2023 Left 04/21/23 Left 06/02/23 Left 06/16/23 Left 07/28/23 Left 08/25/23  Shoulder flexion 4+/5 2+/5 1/5 2-/5 2-/5 2-/5 2-/5 2-/5 2- 2+/5 3-/5 3-/5  Shoulder abduction 4+/5 2+/5 2-/5 2-/5 2-/5 2-/5 2-/5 2-/5 2- 2+/5 3-/5 3-/5  Shoulder adduction                Shoulder extension                Shoulder internal rotation           4- 4-/5  4/5  Shoulder external  rotation           3- 3-/5  3-/5  Middle trapezius                Lower trapezius                Elbow flexion 4+/5 3+/5 3/5 3/5 3+/5 3+/5 4/5 4/5 4+/5 4+/5 4+/5 5/5  Elbow extension 4+/5 3+/5 3/5 3/5 3+/5 3+/5 4/5 4/5 4/5 4+/5 4+/5 4+/5  Wrist flexion 4+/5 TBD 3/5 3/5 3+/5 3+/5 4/5 4/5 4+/5 4+/5 4+/5 4+/5  Wrist extension 4+/5 TBD 3/5 3/5 3+/5 3+/5 4/5 4/5 4/5 4/5 4+/5 4+/5  Wrist ulnar deviation                Wrist radial deviation                Wrist pronation                Wrist supination                (Blank rows = not tested)   HAND FUNCTION: Grip strength: Right: 33 lbs; Left: 11 lbs, Lateral pinch: Right: 17 lbs, Left: 2 lbs, and 3 point pinch: Right: 17 lbs, Left: 2 lbs  10/09/2022: Grip strength: Right: 33 lbs; Left: 10 lbs, Lateral pinch: Right: 17 lbs, Left: 1 lbs, and 3 point pinch: Right: 17 lbs, Left: 1 lbs   10/23/2022: Grip strength: Right: 33 lbs; Left: 16 lbs, Lateral pinch: Right: 17 lbs, Left: 1 lbs, and 3 point pinch: Right: 17 lbs, Left: 1 lbs  12/02/2022: Grip strength: Right: 33 lbs; Left: 17 lbs, Lateral pinch:  Right: 17 lbs, Left: 7 lbs, and 3 point pinch: Right: 17 lbs, Left: 3 lbs   01/13/2023: Grip strength: Right: 33 lbs; Left: 15 lbs, Lateral pinch: Right: 17 lbs, Left: 9 lbs, and 3 point pinch: Right: 17 lbs, Left: 7 lbs  03/10/2023: Grip strength: Right: 33 lbs; Left: 15 lbs, Lateral pinch: Right: 17 lbs, Left: 9 lbs, and 3 point pinch: Right: 17 lbs, Left: 7 lbs   04/21/2023: Grip strength: Right: 34 lbs; Left: 13 lbs, Lateral pinch: NT 3 point pinch: Right: NT  06/02/23: Grip strength: Right: 34 lbs; Left: 19 lbs, Lateral pinch: Right: 16 lbs; Left: 6 lbs; 3 point pinch: Right: 13 lbs, Left: 7 lbs   06/16/2023: Grip strength: Right: 39 lbs; Left: 19 lbs, Lateral pinch: Right: 16 lbs; Left: 5 lbs; 3 point pinch: Right: 13 lbs, Left: 9 lbs   07/28/2023: Grip strength: Right: 39 lbs; Left: 20 lbs, Lateral pinch: Right: 16 lbs; Left: 7 lbs; 3 point  pinch: Right: 13 lbs, Left: 9 lbs    08/25/2023: Grip strength: Right: 39 lbs; Left: 20 lbs, Lateral pinch: Right: 16 lbs; Left: 7 lbs; 3 point pinch: Right: 13 lbs, Left: 9 lbs             COORDINATION:   9 Hole Peg test: Right: 27 sec.  sec; Left: 52 sec.  10/09/2022: 9 Hole Peg test: Right: 27 sec.  sec; Left: 1 min. & 35 sec.  01/13/2023: 9 Hole Peg test: Right: 27 sec.  sec; Left: 51. Sec.  03/10/2023: 9 Hole Peg test: Right: 27 sec.  sec; Left: 54 Sec.  04/21/2023: 9 Hole Peg test: Right: 27 sec.  sec; Left: 46 Sec.  06/02/23: 9 hole Peg test: Left: 47 sec  06/15/23: 9 hole Peg test: Left: 45 sec  07/28/2023: 9 hole Peg test: Left: 44 sec  08/25/2023: 9 hole Peg test: Left: 39 sec     SENSATION: Light touch: WFL Proprioception: WFL   COGNITION: Overall cognitive status: Within functional limits for tasks assessed   VISION: Subjective report: Glasses. Just received a new prescription to increase bifocal strength Baseline vision: Macular Degeneration Visual history: macular degeneration   VISION ASSESSMENT: To be further assessed in functional context  Left sided Awareness     PERCEPTION: Limited left sided awareness   TODAY'S TREATMENT:   There. Ex.:   -UE strengthening  using the  SciFit for 8 min. with constant monitoring of the BUE on level 4.0  -BUE HEP review using cane B shoulder flexion, abduction, external rotation, internal rotation, circular  motion.  Therapeutic Activities:   -Combined grasping with goal directed functional reaching focusing on manipulating 1/2" washers from a magnetic dish, reaching up, sustaining shoulder elevation while placing the washer over a small precise target on vertical dowels positioned at in multiple diagonal, and vertical planes. -support at the left elbow provided and tapered as needed with reaching. -Removed the washers one at a time through the vertical, and diagonal dowel  Neuromuscular re-education:    -facilitated [facility] skills grasping, and manipulating 1", 3/4", an 1/2" washers from a resistive magnetic dish. -facilitated translatory movements grasping washers, storing them in the hand, then moving them from her palm to the tip of her 2nd digit, and thumb in preparation for stacking them   .         PATIENT EDUCATION: Education details: UE strengthening, FMC, functional reach Person educated: Patient Education method: verbal cues Education comprehension: verbalized understanding    HOME  EXERCISE PROGRAM:  Reviewed home activities to enhance left hand digit extension during typing skills.   GOALS: Goals reviewed with patient? Yes     SHORT TERM GOALS: Target date: 07/21/23   Pt. Will improve FOTO score by 2 points to reflect improved pt. perceived functional performance Baseline: 07/28/2023: FOTO score: 62, 06/16/2023: FOTO score: 70 06/02/23: 65; 04/21/2023: 49 03/10/2023: 59 01/13/2023: 49 12/01/2022: FOTO: 51 10/23/2022: FOTO 49 10/09/2022: FOTO 49, FOTO: 61 TR score: 62 Goal status:  Goal discharged   LONG TERM GOALS: Target date: 11/17/2023   Pt. Will improve left shoulder ROM by 10 degrees to be able able to efficiently apply deodorant Baseline: 08/25/23: shoulder flexion 85(108), abd 84(110)  07/28/2023: shoulder flexion 83(108), abd 80(110)  06/16/23: shoulder flexion 80 (108), abd 76(106) Pt. Is able to apply deodorant to the left, however takes increased time.; 06/02/23: shoulder flexion 77 (104), abd 70 (100); 04/21/2023: shoulder flexion: 40(87), Abduction: 48(76) 03/10/2023: Shoulder flexion: 35(85) 01/13/2023:  Shoulder flexion: 35(82), Abduction: 47(70) 12/02/2022: Shoulder flexion: 32(82), Abduction: 45(60)  10/23/2022: Shoulder flexion: 26(80), Abduction: 32(50) 10/09/2022: flexion: 0(55), Abduction: 32(54) Goal status: Improving, ongoing   2.  Pt. will be able to independently hold and use a blow dryer, and brush for hair care. Baseline: 08/25/23: Pt. Continues to be able  to hold a blow dryer, and is able to move the blow dryer within her hand more efficiently, however reaches only to L side of head and lower part of back of head with brush and blow dryer d/t limited ER ROM and strength 07/28/2023: Pt can hold a blow dryer, and is able to move the blow dryer within her hand, however reaches only to L side of head and lower part of back of head with brush and blow dryer d/t limited ER ROM and strength11/26/2024: shoulder flexion 80 (108), abd 76(106) 06/02/23: Pt can hold a blow dryer but reaches only to L side of head and lower part of back of head with brush and blow dryer d/t limited ER ROM and strength; 04/21/2023: Pt. is able to hold the hair dryer in the left hand, however is unable to reach up to dry it, and has difficulty using a brush with the left UE. 03/10/2023: Pt. continues to be able to hold the hair dryer in the left hand, however is unable to reach up to dry it, and has difficulty using a brush 01/13/2023: Pt. Can hold the hair dryer in the left hand, however is unable to reach up to dry it, and has difficulty using a brush. 12/02/2022: Pt. is unable to actively elevate he left shoulder in order to blow dry her hair. 10/23/2022: Pt. continues to be unable to hold a blow dryer. Eval: Pt. Is unable to sustain her BUEs in elevation, and use a blow dryer, and brush. 10/09/2022: Pt. Is unable to hold a blow dryer. Eval: Pt. Is unable to sustain her BUEs in elevation, and use a blow dryer, and brush. Goal status: improving/ongoing   3.  Pt. Will improve left lateral pinch strength by 3# to be able to independently cut meat. Baseline: 08/25/2023: Lateral Pinch strength: 7# 07/28/2023:  Lateral pinch: Left: 7 lbs.06/16/2023: Lateral pinch: Left: 5 lbs. Pt. Has difficulty cutting steak, however is able to cut tender pieces of chicken. 06/02/23: L lateral pinch: 6# (pt reports she lacks the strength to cut steak but can cut tender chicken); 04/21/2023: NT(Pinch Meter sent out for  calibration) Pt. continues to have difficulty cutting meat. 03/10/2023:  Left lateral pinch strength: 9#   01/13/2023: Left lateral pinch strength: 9#  12/02/2022: Left pinch: 7# Pt. has difficulty cutting meat. 10/23/2022: Pt. Continues to present with difficulty cutting meat. 10/09/2022: 1#  Eval: 2#. Pt. has difficulty stabilizing utensil, and food while cutting food. Goal status: Discontinued   4.  Pt. Will improve Left hand [facility] skills to be able to be able to independently, and efficiently manipulate small objects during ADL tasks. Baseline: 08/25/2023: 39 sec. 1/0/2025: Left FMC skills: 44 sec. 06/16/2023: Left 45 sec. 06/02/2023: L 47 sec; 04/21/2023:  46 sec. 03/10/2023: 54 sec. 01/13/2023: 51 sec. 12/02/2022: TBD  10/23/2022: Pt. Continues to present with difficulty manipulating small objects. 10/09/2022: Left: 1 min & 35 sec.: Eval: Left: 52 sec. Right: 27 sec. Goal status: Improving, Ongoing    6.: Pt. will improve with left grip strength to be able to hold and pull up pants Baseline: 08/25/23: Left  grip strength 20# independent hiking pants. 07/28/2023: Left grip strength: 20# 06/16/2023: Left grip strength: 19# Pt. Is now able to pull her pants up.06/02/23: L grip 19#, improving; 04/21/2023: Pt. is able to pull up covers, however has difficulty pulling up pants. 03/10/2023: left  grip strength: 15# 01/13/2023: left  grip strength: 15# 12/02/2022: 17# 10/23/2022: Left: 16# 10/09/2022: Left: 10# Eval:  Left grip strength 11#. Pt. has difficulty holding a full drink  securely with the left hand.             Goal status: Partially met. Discontinue   7. Pt. will improve typing speed, and accuracy in preparation for efficiently typing simple email message. Baseline: 08/25/2023: TBD 07/28/2023: Pt. now has a working laptop available for her to use. 06/16/2023:  Limited ability to carry-over practice at home due to Pt.'s mouse at home is not working. Pt. Also can not see the keys on her computer at home. 06/02/23: same  as 04/21/23; 04/21/2023:  Pt. continues to present with limited speed, and accuracy with typing  03/10/2023: Pt. continues to present with limited speed, and accuracy with typing 01/13/2023: TBD 12/02/2022:  Pt. continues to present with limited speed, and accuracy with typing.10/23/2022: Pt. continues to present with limited speed, and accuracy with typing. 10/09/2022: Pt. presents with difficulty typing. Pt. presents with difficulty typing an email. Typing speed, and accuracy TBD             Goal status: Ongoing                                CLINICAL IMPRESSION:  Pt. presents with right shoulder, and left hip pain today, which Pt. reports started yesterday. Pt. has resumed using a cane today. Pt. requires assist proximally at the left elbow during functional reaching. Pt. is improving with left hand [facility] skills. Pt. continues to benefit from OT services to work towards improving Left UE functioning in order to maximize engagement in, and overall independence with ADLs, and IADL tasks, while reducing risk of adhesive capsulitis.     PERFORMANCE DEFICITS in functional skills including ADLs, IADLs, coordination, dexterity, sensation, ROM, strength, FMC, decreased knowledge of use of DME, vision, and UE functional use, cognitive skills including attention, and psychosocial skills including coping strategies, environmental adaptation, habits, and routines and behaviors.    IMPAIRMENTS are limiting patient from ADLs, IADLs, and social participation.    COMORBIDITIES may have co-morbidities  that affects occupational performance. Patient will benefit from skilled OT to address  above impairments and improve overall function.   MODIFICATION OR ASSISTANCE TO COMPLETE EVALUATION: Min-Moderate modification of tasks or assist with assess necessary to complete an evaluation.   OT OCCUPATIONAL PROFILE AND HISTORY: Detailed assessment: Review of records and additional review of physical, cognitive, psychosocial history  related to current functional performance.   CLINICAL DECISION MAKING: Moderate - several treatment options, min-mod task modification necessary   REHAB POTENTIAL: Good   EVALUATION COMPLEXITY: Moderate      PLAN: OT FREQUENCY: 1x/week   OT DURATION: 12 weeks   PLANNED INTERVENTIONS: self care/ADL training, therapeutic exercise, therapeutic activity, neuromuscular re-education, manual therapy, passive range of motion, and paraffin   RECOMMENDED OTHER SERVICES:    CONSULTED AND AGREED WITH PLAN OF CARE: Patient   PLAN FOR NEXT SESSION:  Left hand strengthening and coordination exercises  Olegario Messier, MS, OTR/L    09/01/23, 10:32 AM

## 2023-09-02 NOTE — Progress Notes (Signed)
[facility] Summary Report / Loop Recorder

## 2023-09-03 ENCOUNTER — Ambulatory Visit: Payer: Medicare HMO | Admitting: Occupational Therapy

## 2023-09-06 ENCOUNTER — Encounter: Payer: Self-pay | Admitting: Internal Medicine

## 2023-09-08 ENCOUNTER — Ambulatory Visit: Payer: Medicare HMO | Admitting: Occupational Therapy

## 2023-09-08 DIAGNOSIS — M6281 Muscle weakness (generalized): Secondary | ICD-10-CM | POA: Diagnosis not present

## 2023-09-08 DIAGNOSIS — R278 Other lack of coordination: Secondary | ICD-10-CM

## 2023-09-08 NOTE — Therapy (Signed)
OCCUPATIONAL THERAPY NEURO TREATMENT NOTE   Patient Name: Alyssa Mayo MRN: [id number] DOB:1945/08/11, 78 y.o., female Today's Date: 03/14/2022   PCP: [name], MD REFERRING PROVIDER: [name], NP     OT End of Session - 09/08/23 1035     Visit Number 58    Number of Visits 81    Date for OT Re-Evaluation 11/17/23    OT Start Time 1015    OT Stop Time 1100    OT Time Calculation (min) 45 min    Activity Tolerance Patient tolerated treatment well    Behavior During Therapy [facility] for tasks assessed/performed                          Past Medical History:  Diagnosis Date   Abnormal levels of other serum enzymes 07/17/2015   Arthritis     Constipation 07/11/2015   COVID-19 03/06/2021   Diabetes mellitus without complication (HCC)     Elevated liver enzymes     Fatty liver     Generalized abdominal pain 07/11/2015   Hyperlipidemia     Hypertension     Lifelong obesity     Macular degeneration     Morbid obesity due to excess calories (HCC) 07/11/2015   Other fatigue 07/11/2015   Sleep apnea     Spinal headache           Past Surgical History:  Procedure Laterality Date   ABDOMINAL HYSTERECTOMY       APPENDECTOMY       CATARACT EXTRACTION       CERVICAL LAMINECTOMY   07/21/1985   CESAREAN SECTION       COLONOSCOPY   07/21/2012    diverticulosis, [facility] Dr. [name]    COLONOSCOPY WITH PROPOFOL N/A 02/04/2019    Procedure: COLONOSCOPY WITH PROPOFOL;  Surgeon: [name], MD;  Location: [facility] ENDOSCOPY;  Service: Endoscopy;  Laterality: N/A;   COLONOSCOPY WITH PROPOFOL N/A 03/18/2021    Procedure: COLONOSCOPY WITH PROPOFOL;  Surgeon: [name], MD;  Location: [facility] ENDOSCOPY;  Service: Endoscopy;  Laterality: N/A;  DM   DIAGNOSTIC LAPAROSCOPY       ESOPHAGOGASTRODUODENOSCOPY (EGD) WITH PROPOFOL N/A 02/04/2019    Procedure: ESOPHAGOGASTRODUODENOSCOPY (EGD) WITH PROPOFOL;  Surgeon: [name], MD;  Location: [facility] ENDOSCOPY;   Service: Endoscopy;  Laterality: N/A;   ESOPHAGOGASTRODUODENOSCOPY (EGD) WITH PROPOFOL N/A 03/18/2021    Procedure: ESOPHAGOGASTRODUODENOSCOPY (EGD) WITH PROPOFOL;  Surgeon: [name], MD;  Location: [facility] ENDOSCOPY;  Service: Endoscopy;  Laterality: N/A;   LOOP RECORDER INSERTION N/A 08/05/2021    Procedure: LOOP RECORDER INSERTION;  Surgeon: [name], MD;  Location: [facility] INVASIVE CV LAB;  Service: Cardiovascular;  Laterality: N/A;   SPINE SURGERY       TUBAL LIGATION            Patient Active Problem List    Diagnosis Date Noted   Paroxysmal atrial fibrillation (HCC) 08/08/2022   Hypercoagulable state due to paroxysmal atrial fibrillation (HCC) 08/08/2022   Cerebral edema (HCC) 08/07/2021   OSA (obstructive sleep apnea) 08/07/2021   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   CVA (cerebral vascular accident) (HCC) 08/01/2021   GERD (gastroesophageal reflux disease) 07/20/2019   Advanced care planning/counseling discussion 12/17/2016   Skin lesions, generalized 11/13/2016   Chronic right hip pain 06/17/2016   Vitamin D deficiency 09/26/2015   Diastasis recti 08/08/2015   Elevated serum GGT level 07/24/2015   Essential  hypertension 07/17/2015   Fatty liver 07/17/2015   Elevated alkaline phosphatase level 07/17/2015   Elevated serum glutamic pyruvic transaminase (SGPT) level 07/17/2015   Abnormal finding on EKG 07/11/2015   Morbid obesity (HCC) 07/11/2015   Colon, diverticulosis 07/11/2015   Abdominal wall hernia 07/11/2015   DM (diabetes mellitus), type 2 with neurological complications (HCC)     Hyperlipidemia      ONSET DATE: 08/01/2021   REFERRING DIAG: CVA   THERAPY DIAG:  Muscle weakness (generalized)   Rationale for Evaluation and Treatment Rehabilitation   SUBJECTIVE:   SUBJECTIVE STATEMENT:       Pt reports that she hasn't used her can for a couple of days.  Pt accompanied by: self   PERTINENT HISTORY:  Pt. is a 78 y.o. female who had  a unexpectedly hospitalized from 2/12-2/14/2024 for COVID-19, and a UTI.  Pt. 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 Pt. attended inpatient rehab from 08/07/2021-08/22/2021. Pt. received Home health therapy services this past spring.  Pt. has recently had an assessment through driver rehabilitation services at [facility] with recommendations for referrals for outpatient OT/PT, and ST services. Pt. PMHx includes: HTN, Hyperlipidemia, Macular degeneration, DM, and Obesity.   PRECAUTIONS: None   WEIGHT BEARING RESTRICTIONS No   PAIN: Are you having pain? >1/10 in the bilateral shoulder, and left hip pain.   FALLS: Has patient fallen in last 6 months? Yes.   LIVING ENVIRONMENT: Lives with: lives with their family  Son  [name] Lives in: House/apartment Main living are on one floor Stairs:  2 steps to enter Has following equipment at home: Single point cane, Wheelchair (manual), shower chair, Grab bars, bed rail, and rubber mat.   PLOF: Independent   PATIENT GOALS: To be able to Drive, and cook again   OBJECTIVE:    HAND DOMINANCE: Right    ADLs:  Transfers/ambulation related to ADLs: Independent Eating: Independent Grooming: [employer] Pt. reports being unable to use her left hand to perform haircare.  UB Dressing: Independent pullover shirt, independent now with fastening a bra LB Dressing: Independent donning pants socks, and slide on shoes Toileting: Independent Bathing: Independent Tub Shower transfers: Walk-in shower Independent now    IADLs: Shopping: Needs to be accompanied to the grocery store. Light housekeeping: Do  Meal Prep:  Is able to perform light meal prep Community mobility: Relies on son, and friend Medication management: Son sets up Mining engineer management:TBD Handwriting: TBD   MOBILITY STATUS: Hx of falls out of bed     ACTIVITY TOLERANCE: Activity tolerance:  10-20 min. Before rest break    FUNCTIONAL OUTCOME MEASURES: FOTO: 61  TR score: 62  10/09/2022: FOTO: 49  (Recert) 06/02/23: 65  07/28/2023:  FOTO score: 62   UPPER EXTREMITY ROM      Active ROM Right eval Left eval Left  10/09/2022 Left 10/23/22 Left 12/02/2022 Left 01/13/2023 Left  03/10/2023 Left 04/21/23 Left 05/12/23 Left 06/02/23 Left 06/16/23 Left 07/28/23 Left 08/25/23  Shoulder flexion WFL 54(74) scaption 0(55) 26(80) 32(82) 35(82) 35(85) 40(87) 70(96) 77 (104) 80(108) 83(108) 85(108)  Shoulder abduction WFL 58(75) 32(54) 32(50) 45(60) 47(70) 62(72) 48(76) 73(102) 70 (100) 76(106) 80(110) 84(110)  Shoulder adduction                 Shoulder extension                 Shoulder internal rotation            Thumb  to L3 (T10) Thumb to L3 (T10) Thumb L3 (T10)   Shoulder external rotation            (With elbow at side) 45 (60) (With elbow at side) 47 (60) (With elbow at side) 47 With elbow at side) 48  Elbow flexion [facility] WFL 145 145 145 145 145 147  WFL WFL WFL WFL  Elbow extension [facility] WFL 10(0) 10(0) WFL WFL [facility]  [facility] WFL  Wrist flexion WFL TBD [facility]  [facility] WFL WFL WFL  Wrist extension WFL TBD [facility] WFL [facility]  [facility] WFL  Wrist ulnar deviation                 Wrist radial deviation                 Wrist pronation             [facility] WFL  Wrist supination             [facility] WFL WFL  (Blank rows = not tested)     UPPER EXTREMITY MMT:      MMT Right eval Left eval Left 10/09/2022 Left 10/23/2022 Left 12/02/2022 Left 01/13/2023 Left 03/10/2023 Left 04/21/23 Left 06/02/23 Left 06/16/23 Left 07/28/23 Left 08/25/23  Shoulder flexion 4+/5 2+/5 1/5 2-/5 2-/5 2-/5 2-/5 2-/5 2- 2+/5 3-/5 3-/5  Shoulder abduction 4+/5 2+/5 2-/5 2-/5 2-/5 2-/5 2-/5 2-/5 2- 2+/5 3-/5 3-/5  Shoulder adduction                Shoulder extension                Shoulder internal rotation           4- 4-/5  4/5  Shoulder external rotation           3- 3-/5  3-/5  Middle trapezius                 Lower trapezius                Elbow flexion 4+/5 3+/5 3/5 3/5 3+/5 3+/5 4/5 4/5 4+/5 4+/5 4+/5 5/5  Elbow extension 4+/5 3+/5 3/5 3/5 3+/5 3+/5 4/5 4/5 4/5 4+/5 4+/5 4+/5  Wrist flexion 4+/5 TBD 3/5 3/5 3+/5 3+/5 4/5 4/5 4+/5 4+/5 4+/5 4+/5  Wrist extension 4+/5 TBD 3/5 3/5 3+/5 3+/5 4/5 4/5 4/5 4/5 4+/5 4+/5  Wrist ulnar deviation                Wrist radial deviation                Wrist pronation                Wrist supination                (Blank rows = not tested)   HAND FUNCTION: Grip strength: Right: 33 lbs; Left: 11 lbs, Lateral pinch: Right: 17 lbs, Left: 2 lbs, and 3 point pinch: Right: 17 lbs, Left: 2 lbs  10/09/2022: Grip strength: Right: 33 lbs; Left: 10 lbs, Lateral pinch: Right: 17 lbs, Left: 1 lbs, and 3 point pinch: Right: 17 lbs, Left: 1 lbs   10/23/2022: Grip strength: Right: 33 lbs; Left: 16 lbs, Lateral pinch: Right: 17 lbs, Left: 1 lbs, and 3 point pinch: Right: 17 lbs, Left: 1 lbs  12/02/2022: Grip strength: Right: 33 lbs; Left: 17 lbs, Lateral pinch: Right:  17 lbs, Left: 7 lbs, and 3 point pinch: Right: 17 lbs, Left: 3 lbs   01/13/2023: Grip strength: Right: 33 lbs; Left: 15 lbs, Lateral pinch: Right: 17 lbs, Left: 9 lbs, and 3 point pinch: Right: 17 lbs, Left: 7 lbs  03/10/2023: Grip strength: Right: 33 lbs; Left: 15 lbs, Lateral pinch: Right: 17 lbs, Left: 9 lbs, and 3 point pinch: Right: 17 lbs, Left: 7 lbs   04/21/2023: Grip strength: Right: 34 lbs; Left: 13 lbs, Lateral pinch: NT 3 point pinch: Right: NT  06/02/23: Grip strength: Right: 34 lbs; Left: 19 lbs, Lateral pinch: Right: 16 lbs; Left: 6 lbs; 3 point pinch: Right: 13 lbs, Left: 7 lbs   06/16/2023: Grip strength: Right: 39 lbs; Left: 19 lbs, Lateral pinch: Right: 16 lbs; Left: 5 lbs; 3 point pinch: Right: 13 lbs, Left: 9 lbs   07/28/2023: Grip strength: Right: 39 lbs; Left: 20 lbs, Lateral pinch: Right: 16 lbs; Left: 7 lbs; 3 point pinch: Right: 13 lbs, Left: 9 lbs    08/25/2023: Grip strength: Right:  39 lbs; Left: 20 lbs, Lateral pinch: Right: 16 lbs; Left: 7 lbs; 3 point pinch: Right: 13 lbs, Left: 9 lbs             COORDINATION:   9 Hole Peg test: Right: 27 sec.  sec; Left: 52 sec.  10/09/2022: 9 Hole Peg test: Right: 27 sec.  sec; Left: 1 min. & 35 sec.  01/13/2023: 9 Hole Peg test: Right: 27 sec.  sec; Left: 51. Sec.  03/10/2023: 9 Hole Peg test: Right: 27 sec.  sec; Left: 54 Sec.  04/21/2023: 9 Hole Peg test: Right: 27 sec.  sec; Left: 46 Sec.  06/02/23: 9 hole Peg test: Left: 47 sec  06/15/23: 9 hole Peg test: Left: 45 sec  07/28/2023: 9 hole Peg test: Left: 44 sec  08/25/2023: 9 hole Peg test: Left: 39 sec     SENSATION: Light touch: WFL Proprioception: WFL   COGNITION: Overall cognitive status: Within functional limits for tasks assessed   VISION: Subjective report: Glasses. Just received a new prescription to increase bifocal strength Baseline vision: Macular Degeneration Visual history: macular degeneration   VISION ASSESSMENT: To be further assessed in functional context  Left sided Awareness     PERCEPTION: Limited left sided awareness   TODAY'S TREATMENT:   There. Ex.:   -UE strengthening  using the  SciFit for 8 min. with constant monitoring of the BUE on level 4.0   Neuromuscular re-education:   -facilitated left  hand [facility] skills grasping, and manipulating 1/16" small beads, and placing them over a small paperclip sized dowel. -alternated thumb opposition to the tip of the 2nd through 5th digits to move the small beads.   PATIENT EDUCATION: Education details: UE strengthening, FMC, functional reach Person educated: Patient Education method: verbal cues Education comprehension: verbalized understanding    HOME EXERCISE PROGRAM:  Reviewed home activities to enhance left hand digit extension during typing skills.   GOALS: Goals reviewed with patient? Yes     SHORT TERM GOALS: Target date: 07/21/23   Pt. Will improve FOTO score by 2  points to reflect improved pt. perceived functional performance Baseline: 07/28/2023: FOTO score: 62, 06/16/2023: FOTO score: 70 06/02/23: 65; 04/21/2023: 49 03/10/2023: 59 01/13/2023: 49 12/01/2022: FOTO: 51 10/23/2022: FOTO 49 10/09/2022: FOTO 49, FOTO: 61 TR score: 62 Goal status:  Goal discharged   LONG TERM GOALS: Target date: 11/17/2023   Pt. Will improve left shoulder ROM by 10 degrees  to be able able to efficiently apply deodorant Baseline: 08/25/23: shoulder flexion 85(108), abd 84(110)  07/28/2023: shoulder flexion 83(108), abd 80(110)  06/16/23: shoulder flexion 80 (108), abd 76(106) Pt. Is able to apply deodorant to the left, however takes increased time.; 06/02/23: shoulder flexion 77 (104), abd 70 (100); 04/21/2023: shoulder flexion: 40(87), Abduction: 48(76) 03/10/2023: Shoulder flexion: 35(85) 01/13/2023:  Shoulder flexion: 35(82), Abduction: 47(70) 12/02/2022: Shoulder flexion: 32(82), Abduction: 45(60)  10/23/2022: Shoulder flexion: 26(80), Abduction: 32(50) 10/09/2022: flexion: 0(55), Abduction: 32(54) Goal status: Improving, ongoing   2.  Pt. will be able to independently hold and use a blow dryer, and brush for hair care. Baseline: 08/25/23: Pt. Continues to be able to hold a blow dryer, and is able to move the blow dryer within her hand more efficiently, however reaches only to L side of head and lower part of back of head with brush and blow dryer d/t limited ER ROM and strength 07/28/2023: Pt can hold a blow dryer, and is able to move the blow dryer within her hand, however reaches only to L side of head and lower part of back of head with brush and blow dryer d/t limited ER ROM and strength11/26/2024: shoulder flexion 80 (108), abd 76(106) 06/02/23: Pt can hold a blow dryer but reaches only to L side of head and lower part of back of head with brush and blow dryer d/t limited ER ROM and strength; 04/21/2023: Pt. is able to hold the hair dryer in the left hand, however is unable to reach up to dry  it, and has difficulty using a brush with the left UE. 03/10/2023: Pt. continues to be able to hold the hair dryer in the left hand, however is unable to reach up to dry it, and has difficulty using a brush 01/13/2023: Pt. Can hold the hair dryer in the left hand, however is unable to reach up to dry it, and has difficulty using a brush. 12/02/2022: Pt. is unable to actively elevate he left shoulder in order to blow dry her hair. 10/23/2022: Pt. continues to be unable to hold a blow dryer. Eval: Pt. Is unable to sustain her BUEs in elevation, and use a blow dryer, and brush. 10/09/2022: Pt. Is unable to hold a blow dryer. Eval: Pt. Is unable to sustain her BUEs in elevation, and use a blow dryer, and brush. Goal status: improving/ongoing   3.  Pt. Will improve left lateral pinch strength by 3# to be able to independently cut meat. Baseline: 08/25/2023: Lateral Pinch strength: 7# 07/28/2023:  Lateral pinch: Left: 7 lbs.06/16/2023: Lateral pinch: Left: 5 lbs. Pt. Has difficulty cutting steak, however is able to cut tender pieces of chicken. 06/02/23: L lateral pinch: 6# (pt reports she lacks the strength to cut steak but can cut tender chicken); 04/21/2023: NT(Pinch Meter sent out for calibration) Pt. continues to have difficulty cutting meat. 03/10/2023: Left lateral pinch strength: 9#   01/13/2023: Left lateral pinch strength: 9#  12/02/2022: Left pinch: 7# Pt. has difficulty cutting meat. 10/23/2022: Pt. Continues to present with difficulty cutting meat. 10/09/2022: 1#  Eval: 2#. Pt. has difficulty stabilizing utensil, and food while cutting food. Goal status: Discontinued   4.  Pt. Will improve Left hand [facility] skills to be able to be able to independently, and efficiently manipulate small objects during ADL tasks. Baseline: 08/25/2023: 39 sec. 1/0/2025: Left FMC skills: 44 sec. 06/16/2023: Left 45 sec. 06/02/2023: L 47 sec; 04/21/2023:  46 sec. 03/10/2023: 54 sec. 01/13/2023: 51 sec. 12/02/2022:  TBD  10/23/2022: Pt.  Continues to present with difficulty manipulating small objects. 10/09/2022: Left: 1 min & 35 sec.: Eval: Left: 52 sec. Right: 27 sec. Goal status: Improving, Ongoing    6.: Pt. will improve with left grip strength to be able to hold and pull up pants Baseline: 08/25/23: Left  grip strength 20# independent hiking pants. 07/28/2023: Left grip strength: 20# 06/16/2023: Left grip strength: 19# Pt. Is now able to pull her pants up.06/02/23: L grip 19#, improving; 04/21/2023: Pt. is able to pull up covers, however has difficulty pulling up pants. 03/10/2023: left  grip strength: 15# 01/13/2023: left  grip strength: 15# 12/02/2022: 17# 10/23/2022: Left: 16# 10/09/2022: Left: 10# Eval:  Left grip strength 11#. Pt. has difficulty holding a full drink  securely with the left hand.             Goal status: Partially met. Discontinue   7. Pt. will improve typing speed, and accuracy in preparation for efficiently typing simple email message. Baseline: 08/25/2023: TBD 07/28/2023: Pt. now has a working laptop available for her to use. 06/16/2023:  Limited ability to carry-over practice at home due to Pt.'s mouse at home is not working. Pt. Also can not see the keys on her computer at home. 06/02/23: same as 04/21/23; 04/21/2023:  Pt. continues to present with limited speed, and accuracy with typing  03/10/2023: Pt. continues to present with limited speed, and accuracy with typing 01/13/2023: TBD 12/02/2022:  Pt. continues to present with limited speed, and accuracy with typing.10/23/2022: Pt. continues to present with limited speed, and accuracy with typing. 10/09/2022: Pt. presents with difficulty typing. Pt. presents with difficulty typing an email. Typing speed, and accuracy TBD             Goal status: Ongoing                                CLINICAL IMPRESSION:  Pt. presents with right shoulder, and left hip pain today, which Pt. reports started yesterday. Pt. has resumed using a cane today. Pt. requires assist proximally at  the left elbow during functional reaching. Pt. is improving with left hand [facility] skills, and is able grasp the small beads from the small container, manipulate them, and place them onto the small dowel. Pt. required increased time to complete, and few visual cues. Pt. ropped multiple small  beads when attempting to move them through her hand. Pt. continues to benefit from OT services to work towards improving Left UE functioning in order to maximize engagement in, and overall independence with ADLs, and IADL tasks, while reducing risk of adhesive capsulitis.     PERFORMANCE DEFICITS in functional skills including ADLs, IADLs, coordination, dexterity, sensation, ROM, strength, FMC, decreased knowledge of use of DME, vision, and UE functional use, cognitive skills including attention, and psychosocial skills including coping strategies, environmental adaptation, habits, and routines and behaviors.    IMPAIRMENTS are limiting patient from ADLs, IADLs, and social participation.    COMORBIDITIES may have co-morbidities  that affects occupational performance. Patient will benefit from skilled OT to address above impairments and improve overall function.   MODIFICATION OR ASSISTANCE TO COMPLETE EVALUATION: Min-Moderate modification of tasks or assist with assess necessary to complete an evaluation.   OT OCCUPATIONAL PROFILE AND HISTORY: Detailed assessment: Review of records and additional review of physical, cognitive, psychosocial history related to current functional performance.   CLINICAL DECISION MAKING: Moderate - several treatment  options, min-mod task modification necessary   REHAB POTENTIAL: Good   EVALUATION COMPLEXITY: Moderate      PLAN: OT FREQUENCY: 1x/week   OT DURATION: 12 weeks   PLANNED INTERVENTIONS: self care/ADL training, therapeutic exercise, therapeutic activity, neuromuscular re-education, manual therapy, passive range of motion, and paraffin   RECOMMENDED OTHER SERVICES:     CONSULTED AND AGREED WITH PLAN OF CARE: Patient   PLAN FOR NEXT SESSION:  Left hand strengthening and coordination exercises  Olegario Messier, MS, OTR/L    09/08/23, 10:39 AM

## 2023-09-10 ENCOUNTER — Ambulatory Visit: Payer: Medicare HMO | Admitting: Adult Health

## 2023-09-10 ENCOUNTER — Ambulatory Visit: Payer: Medicare HMO | Admitting: Occupational Therapy

## 2023-09-15 ENCOUNTER — Ambulatory Visit: Payer: Medicare HMO | Admitting: Occupational Therapy

## 2023-09-15 DIAGNOSIS — M6281 Muscle weakness (generalized): Secondary | ICD-10-CM | POA: Diagnosis not present

## 2023-09-15 NOTE — Therapy (Addendum)
 OCCUPATIONAL THERAPY NEURO TREATMENT NOTE   Patient Name: Alyssa Mayo MRN: [id number] DOB:[birth year], 78 y.o., female Today's Date: 03/14/2022   PCP: [name], MD REFERRING PROVIDER: [name], NP     OT End of Session - 09/15/23 1026     Visit Number 59    Number of Visits 81    Date for OT Re-Evaluation 11/17/23    Authorization Type Progress reporting period starting 06/16/2023    OT Start Time 1015    OT Stop Time 1100    OT Time Calculation (min) 45 min    Activity Tolerance Patient tolerated treatment well    Behavior During Therapy [facility] for tasks assessed/performed                          Past Medical History:  Diagnosis Date   Abnormal levels of other serum enzymes 07/17/2015   Arthritis     Constipation 07/11/2015   COVID-19 03/06/2021   Diabetes mellitus without complication (HCC)     Elevated liver enzymes     Fatty liver     Generalized abdominal pain 07/11/2015   Hyperlipidemia     Hypertension     Lifelong obesity     Macular degeneration     Morbid obesity due to excess calories (HCC) 07/11/2015   Other fatigue 07/11/2015   Sleep apnea     Spinal headache           Past Surgical History:  Procedure Laterality Date   ABDOMINAL HYSTERECTOMY       APPENDECTOMY       CATARACT EXTRACTION       CERVICAL LAMINECTOMY   07/21/1985   CESAREAN SECTION       COLONOSCOPY   07/21/2012    diverticulosis, [facility] Dr. [name]    COLONOSCOPY WITH PROPOFOL N/A 02/04/2019    Procedure: COLONOSCOPY WITH PROPOFOL;  Surgeon: [name], MD;  Location: [facility] ENDOSCOPY;  Service: Endoscopy;  Laterality: N/A;   COLONOSCOPY WITH PROPOFOL N/A 03/18/2021    Procedure: COLONOSCOPY WITH PROPOFOL;  Surgeon: [name], MD;  Location: [facility] ENDOSCOPY;  Service: Endoscopy;  Laterality: N/A;  DM   DIAGNOSTIC LAPAROSCOPY       ESOPHAGOGASTRODUODENOSCOPY (EGD) WITH PROPOFOL N/A 02/04/2019    Procedure: ESOPHAGOGASTRODUODENOSCOPY (EGD) WITH  PROPOFOL;  Surgeon: [name], MD;  Location: [facility] ENDOSCOPY;  Service: Endoscopy;  Laterality: N/A;   ESOPHAGOGASTRODUODENOSCOPY (EGD) WITH PROPOFOL N/A 03/18/2021    Procedure: ESOPHAGOGASTRODUODENOSCOPY (EGD) WITH PROPOFOL;  Surgeon: [name], MD;  Location: [facility] ENDOSCOPY;  Service: Endoscopy;  Laterality: N/A;   LOOP RECORDER INSERTION N/A 08/05/2021    Procedure: LOOP RECORDER INSERTION;  Surgeon: [name], MD;  Location: [facility] INVASIVE CV LAB;  Service: Cardiovascular;  Laterality: N/A;   SPINE SURGERY       TUBAL LIGATION            Patient Active Problem List    Diagnosis Date Noted   Paroxysmal atrial fibrillation (HCC) 08/08/2022   Hypercoagulable state due to paroxysmal atrial fibrillation (HCC) 08/08/2022   Cerebral edema (HCC) 08/07/2021   OSA (obstructive sleep apnea) 08/07/2021   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   CVA (cerebral vascular accident) (HCC) 08/01/2021   GERD (gastroesophageal reflux disease) 07/20/2019   Advanced care planning/counseling discussion 12/17/2016   Skin lesions, generalized 11/13/2016   Chronic right hip pain 06/17/2016   Vitamin D deficiency 09/26/2015   Diastasis recti 08/08/2015  Elevated serum GGT level 07/24/2015   Essential hypertension 07/17/2015   Fatty liver 07/17/2015   Elevated alkaline phosphatase level 07/17/2015   Elevated serum glutamic pyruvic transaminase (SGPT) level 07/17/2015   Abnormal finding on EKG 07/11/2015   Morbid obesity (HCC) 07/11/2015   Colon, diverticulosis 07/11/2015   Abdominal wall hernia 07/11/2015   DM (diabetes mellitus), type 2 with neurological complications (HCC)     Hyperlipidemia      ONSET DATE: 08/01/2021   REFERRING DIAG: CVA   THERAPY DIAG:  Muscle weakness (generalized)   Rationale for Evaluation and Treatment Rehabilitation   SUBJECTIVE:   SUBJECTIVE STATEMENT:       Pt. reports having taken a day trip to [place] this week.  Pt  accompanied by: self   PERTINENT HISTORY:  Pt. is a 78 y.o. female who had a unexpectedly hospitalized from 2/12-2/14/2024 for COVID-19, and a UTI.  Pt. 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 Pt. attended inpatient rehab from 08/07/2021-08/22/2021. Pt. received Home health therapy services this past spring.  Pt. has recently had an assessment through driver rehabilitation services at [facility] with recommendations for referrals for outpatient OT/PT, and ST services. Pt. PMHx includes: HTN, Hyperlipidemia, Macular degeneration, DM, and Obesity.   PRECAUTIONS: None   WEIGHT BEARING RESTRICTIONS No   PAIN: Are you having pain? >1/10 (.5/10) in the left  shoulder, and hip.  FALLS: Has patient fallen in last 6 months? Yes.   LIVING ENVIRONMENT: Lives with: lives with their family  Son  [name] Lives in: House/apartment Main living are on one floor Stairs:  2 steps to enter Has following equipment at home: Single point cane, Wheelchair (manual), shower chair, Grab bars, bed rail, and rubber mat.   PLOF: Independent   PATIENT GOALS: To be able to Drive, and cook again   OBJECTIVE:    HAND DOMINANCE: Right    ADLs:  Transfers/ambulation related to ADLs: Independent Eating: Independent Grooming: [employer] Pt. reports being unable to use her left hand to perform haircare.  UB Dressing: Independent pullover shirt, independent now with fastening a bra LB Dressing: Independent donning pants socks, and slide on shoes Toileting: Independent Bathing: Independent Tub Shower transfers: Walk-in shower Independent now    IADLs: Shopping: Needs to be accompanied to the grocery store. Light housekeeping: Do  Meal Prep:  Is able to perform light meal prep Community mobility: Relies on son, and friend Medication management: Son sets up Mining engineer management:TBD Handwriting: TBD   MOBILITY STATUS: Hx of falls out of bed     ACTIVITY  TOLERANCE: Activity tolerance:  10-20 min. Before rest break   FUNCTIONAL OUTCOME MEASURES: FOTO: 61  TR score: 62  10/09/2022: FOTO: 49  (Recert) 06/02/23: 65  07/28/2023:  FOTO score: 62   UPPER EXTREMITY ROM      Active ROM Right eval Left eval Left  10/09/2022 Left 10/23/22 Left 12/02/2022 Left 01/13/2023 Left  03/10/2023 Left 04/21/23 Left 05/12/23 Left 06/02/23 Left 06/16/23 Left 07/28/23 Left 08/25/23  Shoulder flexion WFL 54(74) scaption 0(55) 26(80) 32(82) 35(82) 35(85) 40(87) 70(96) 77 (104) 80(108) 83(108) 85(108)  Shoulder abduction WFL 58(75) 32(54) 32(50) 45(60) 47(70) 62(72) 48(76) 73(102) 70 (100) 76(106) 80(110) 84(110)  Shoulder adduction                 Shoulder extension                 Shoulder internal rotation  Thumb to L3 (T10) Thumb to L3 (T10) Thumb L3 (T10)   Shoulder external rotation            (With elbow at side) 45 (60) (With elbow at side) 47 (60) (With elbow at side) 47 With elbow at side) 48  Elbow flexion [facility] WFL [phone]  WFL WFL WFL WFL  Elbow extension [facility] WFL 10(0) 10(0) WFL WFL [facility]  [facility] WFL  Wrist flexion WFL TBD [facility] WFL WFL WFL  Wrist extension WFL TBD [facility] WFL [facility]  [facility] WFL  Wrist ulnar deviation                 Wrist radial deviation                 Wrist pronation             [facility] WFL  Wrist supination             [facility] WFL WFL  (Blank rows = not tested)     UPPER EXTREMITY MMT:      MMT Right eval Left eval Left 10/09/2022 Left 10/23/2022 Left 12/02/2022 Left 01/13/2023 Left 03/10/2023 Left 04/21/23 Left 06/02/23 Left 06/16/23 Left 07/28/23 Left 08/25/23  Shoulder flexion 4+/5 2+/5 1/5 2-/5 2-/5 2-/5 2-/5 2-/5 2- 2+/5 3-/5 3-/5  Shoulder abduction 4+/5 2+/5 2-/5 2-/5 2-/5 2-/5 2-/5 2-/5 2- 2+/5 3-/5 3-/5  Shoulder adduction                Shoulder extension                Shoulder internal rotation           4- 4-/5  4/5  Shoulder external  rotation           3- 3-/5  3-/5  Middle trapezius                Lower trapezius                Elbow flexion 4+/5 3+/5 3/5 3/5 3+/5 3+/5 4/5 4/5 4+/5 4+/5 4+/5 5/5  Elbow extension 4+/5 3+/5 3/5 3/5 3+/5 3+/5 4/5 4/5 4/5 4+/5 4+/5 4+/5  Wrist flexion 4+/5 TBD 3/5 3/5 3+/5 3+/5 4/5 4/5 4+/5 4+/5 4+/5 4+/5  Wrist extension 4+/5 TBD 3/5 3/5 3+/5 3+/5 4/5 4/5 4/5 4/5 4+/5 4+/5  Wrist ulnar deviation                Wrist radial deviation                Wrist pronation                Wrist supination                (Blank rows = not tested)   HAND FUNCTION: Grip strength: Right: 33 lbs; Left: 11 lbs, Lateral pinch: Right: 17 lbs, Left: 2 lbs, and 3 point pinch: Right: 17 lbs, Left: 2 lbs  10/09/2022: Grip strength: Right: 33 lbs; Left: 10 lbs, Lateral pinch: Right: 17 lbs, Left: 1 lbs, and 3 point pinch: Right: 17 lbs, Left: 1 lbs   10/23/2022: Grip strength: Right: 33 lbs; Left: 16 lbs, Lateral pinch: Right: 17 lbs, Left: 1 lbs, and 3 point pinch: Right: 17 lbs, Left: 1 lbs  12/02/2022: Grip strength: Right: 33 lbs; Left: 17 lbs, Lateral pinch:  Right: 17 lbs, Left: 7 lbs, and 3 point pinch: Right: 17 lbs, Left: 3 lbs   01/13/2023: Grip strength: Right: 33 lbs; Left: 15 lbs, Lateral pinch: Right: 17 lbs, Left: 9 lbs, and 3 point pinch: Right: 17 lbs, Left: 7 lbs  03/10/2023: Grip strength: Right: 33 lbs; Left: 15 lbs, Lateral pinch: Right: 17 lbs, Left: 9 lbs, and 3 point pinch: Right: 17 lbs, Left: 7 lbs   04/21/2023: Grip strength: Right: 34 lbs; Left: 13 lbs, Lateral pinch: NT 3 point pinch: Right: NT  06/02/23: Grip strength: Right: 34 lbs; Left: 19 lbs, Lateral pinch: Right: 16 lbs; Left: 6 lbs; 3 point pinch: Right: 13 lbs, Left: 7 lbs   06/16/2023: Grip strength: Right: 39 lbs; Left: 19 lbs, Lateral pinch: Right: 16 lbs; Left: 5 lbs; 3 point pinch: Right: 13 lbs, Left: 9 lbs   07/28/2023: Grip strength: Right: 39 lbs; Left: 20 lbs, Lateral pinch: Right: 16 lbs; Left: 7 lbs; 3 point  pinch: Right: 13 lbs, Left: 9 lbs    08/25/2023: Grip strength: Right: 39 lbs; Left: 20 lbs, Lateral pinch: Right: 16 lbs; Left: 7 lbs; 3 point pinch: Right: 13 lbs, Left: 9 lbs             COORDINATION:   9 Hole Peg test: Right: 27 sec.  sec; Left: 52 sec.  10/09/2022: 9 Hole Peg test: Right: 27 sec.  sec; Left: 1 min. & 35 sec.  01/13/2023: 9 Hole Peg test: Right: 27 sec.  sec; Left: 51. Sec.  03/10/2023: 9 Hole Peg test: Right: 27 sec.  sec; Left: 54 Sec.  04/21/2023: 9 Hole Peg test: Right: 27 sec.  sec; Left: 46 Sec.  06/02/23: 9 hole Peg test: Left: 47 sec  06/15/23: 9 hole Peg test: Left: 45 sec  07/28/2023: 9 hole Peg test: Left: 44 sec  08/25/2023: 9 hole Peg test: Left: 39 sec     SENSATION: Light touch: WFL Proprioception: WFL   COGNITION: Overall cognitive status: Within functional limits for tasks assessed   VISION: Subjective report: Glasses. Just received a new prescription to increase bifocal strength Baseline vision: Macular Degeneration Visual history: macular degeneration   VISION ASSESSMENT: To be further assessed in functional context  Left sided Awareness     PERCEPTION: Limited left sided awareness   TODAY'S TREATMENT:   There. Ex.:   -UE strengthening  using the  SciFit for 8 min. with constant monitoring of the BUE on level 4.0   Therapeutic Activities:  -Facilitated functional reaching using the shape tower. Pt. grasped and moved the shapes through 2/4 vertical dowels of varying heights.   Neuromuscular re-education:  -Facilitated left hand [facility] skills grasping 1/2" small circular tipped pegs. -Worked on using the the left hand for storage-grasping, and storing the pegs in the palm of her hand. -Performed translatory movements moving the pegs through her hand from the palm to the tip of the 2nd digit, and thumb in preparation for placing them into a pegboard positioned flat at the tabletop.      PATIENT EDUCATION: Education  details: UE strengthening, FMC, functional reach Person educated: Patient Education method: verbal cues Education comprehension: verbalized understanding    HOME EXERCISE PROGRAM:  Reviewed home activities to enhance left hand digit extension during typing skills.   GOALS: Goals reviewed with patient? Yes     SHORT TERM GOALS: Target date: 07/21/23   Pt. Will improve FOTO score by 2 points to reflect improved pt. perceived functional performance Baseline:  07/28/2023: FOTO score: 62, 06/16/2023: FOTO score: 70 06/02/23: 65; 04/21/2023: 49 03/10/2023: 59 01/13/2023: 49 12/01/2022: FOTO: 51 10/23/2022: FOTO 49 10/09/2022: FOTO 49, FOTO: 61 TR score: 62 Goal status:  Goal discharged   LONG TERM GOALS: Target date: 11/17/2023   Pt. Will improve left shoulder ROM by 10 degrees to be able able to efficiently apply deodorant Baseline: 08/25/23: shoulder flexion 85(108), abd 84(110)  07/28/2023: shoulder flexion 83(108), abd 80(110)  06/16/23: shoulder flexion 80 (108), abd 76(106) Pt. Is able to apply deodorant to the left, however takes increased time.; 06/02/23: shoulder flexion 77 (104), abd 70 (100); 04/21/2023: shoulder flexion: 40(87), Abduction: 48(76) 03/10/2023: Shoulder flexion: 35(85) 01/13/2023:  Shoulder flexion: 35(82), Abduction: 47(70) 12/02/2022: Shoulder flexion: 32(82), Abduction: 45(60)  10/23/2022: Shoulder flexion: 26(80), Abduction: 32(50) 10/09/2022: flexion: 0(55), Abduction: 32(54) Goal status: Improving, ongoing   2.  Pt. will be able to independently hold and use a blow dryer, and brush for hair care. Baseline: 08/25/23: Pt. Continues to be able to hold a blow dryer, and is able to move the blow dryer within her hand more efficiently, however reaches only to L side of head and lower part of back of head with brush and blow dryer d/t limited ER ROM and strength 07/28/2023: Pt can hold a blow dryer, and is able to move the blow dryer within her hand, however reaches only to L side of head  and lower part of back of head with brush and blow dryer d/t limited ER ROM and strength11/26/2024: shoulder flexion 80 (108), abd 76(106) 06/02/23: Pt can hold a blow dryer but reaches only to L side of head and lower part of back of head with brush and blow dryer d/t limited ER ROM and strength; 04/21/2023: Pt. is able to hold the hair dryer in the left hand, however is unable to reach up to dry it, and has difficulty using a brush with the left UE. 03/10/2023: Pt. continues to be able to hold the hair dryer in the left hand, however is unable to reach up to dry it, and has difficulty using a brush 01/13/2023: Pt. Can hold the hair dryer in the left hand, however is unable to reach up to dry it, and has difficulty using a brush. 12/02/2022: Pt. is unable to actively elevate he left shoulder in order to blow dry her hair. 10/23/2022: Pt. continues to be unable to hold a blow dryer. Eval: Pt. Is unable to sustain her BUEs in elevation, and use a blow dryer, and brush. 10/09/2022: Pt. Is unable to hold a blow dryer. Eval: Pt. Is unable to sustain her BUEs in elevation, and use a blow dryer, and brush. Goal status: improving/ongoing   3.  Pt. Will improve left lateral pinch strength by 3# to be able to independently cut meat. Baseline: 08/25/2023: Lateral Pinch strength: 7# 07/28/2023:  Lateral pinch: Left: 7 lbs.06/16/2023: Lateral pinch: Left: 5 lbs. Pt. Has difficulty cutting steak, however is able to cut tender pieces of chicken. 06/02/23: L lateral pinch: 6# (pt reports she lacks the strength to cut steak but can cut tender chicken); 04/21/2023: NT(Pinch Meter sent out for calibration) Pt. continues to have difficulty cutting meat. 03/10/2023: Left lateral pinch strength: 9#   01/13/2023: Left lateral pinch strength: 9#  12/02/2022: Left pinch: 7# Pt. has difficulty cutting meat. 10/23/2022: Pt. Continues to present with difficulty cutting meat. 10/09/2022: 1#  Eval: 2#. Pt. has difficulty stabilizing utensil, and food  while cutting food. Goal status: Discontinued  4.  Pt. Will improve Left hand [facility] skills to be able to be able to independently, and efficiently manipulate small objects during ADL tasks. Baseline: 08/25/2023: 39 sec. 1/0/2025: Left FMC skills: 44 sec. 06/16/2023: Left 45 sec. 06/02/2023: L 47 sec; 04/21/2023:  46 sec. 03/10/2023: 54 sec. 01/13/2023: 51 sec. 12/02/2022: TBD  10/23/2022: Pt. Continues to present with difficulty manipulating small objects. 10/09/2022: Left: 1 min & 35 sec.: Eval: Left: 52 sec. Right: 27 sec. Goal status: Improving, Ongoing    6.: Pt. will improve with left grip strength to be able to hold and pull up pants Baseline: 08/25/23: Left  grip strength 20# independent hiking pants. 07/28/2023: Left grip strength: 20# 06/16/2023: Left grip strength: 19# Pt. Is now able to pull her pants up.06/02/23: L grip 19#, improving; 04/21/2023: Pt. is able to pull up covers, however has difficulty pulling up pants. 03/10/2023: left  grip strength: 15# 01/13/2023: left  grip strength: 15# 12/02/2022: 17# 10/23/2022: Left: 16# 10/09/2022: Left: 10# Eval:  Left grip strength 11#. Pt. has difficulty holding a full drink  securely with the left hand.             Goal status: Partially met. Discontinue   7. Pt. will improve typing speed, and accuracy in preparation for efficiently typing simple email message. Baseline: 08/25/2023: TBD 07/28/2023: Pt. now has a working laptop available for her to use. 06/16/2023:  Limited ability to carry-over practice at home due to Pt.'s mouse at home is not working. Pt. Also can not see the keys on her computer at home. 06/02/23: same as 04/21/23; 04/21/2023:  Pt. continues to present with limited speed, and accuracy with typing  03/10/2023: Pt. continues to present with limited speed, and accuracy with typing 01/13/2023: TBD 12/02/2022:  Pt. continues to present with limited speed, and accuracy with typing.10/23/2022: Pt. continues to present with limited speed, and accuracy with  typing. 10/09/2022: Pt. presents with difficulty typing. Pt. presents with difficulty typing an email. Typing speed, and accuracy TBD             Goal status: Ongoing                                CLINICAL IMPRESSION:  Pt. required assist proximally at the elbow when reaching to the higher heights to place, and remove the shapes from the vertical tower. Pt. has improved with performing translatory movements with the left hand, dropping fewer pegs. Pt. continues to benefit from OT services to work towards improving Left UE functioning in order to maximize engagement in, and overall independence with ADLs, and IADL tasks, while reducing risk of adhesive capsulitis.     PERFORMANCE DEFICITS in functional skills including ADLs, IADLs, coordination, dexterity, sensation, ROM, strength, FMC, decreased knowledge of use of DME, vision, and UE functional use, cognitive skills including attention, and psychosocial skills including coping strategies, environmental adaptation, habits, and routines and behaviors.    IMPAIRMENTS are limiting patient from ADLs, IADLs, and social participation.    COMORBIDITIES may have co-morbidities  that affects occupational performance. Patient will benefit from skilled OT to address above impairments and improve overall function.   MODIFICATION OR ASSISTANCE TO COMPLETE EVALUATION: Min-Moderate modification of tasks or assist with assess necessary to complete an evaluation.   OT OCCUPATIONAL PROFILE AND HISTORY: Detailed assessment: Review of records and additional review of physical, cognitive, psychosocial history related to current functional performance.   CLINICAL DECISION  MAKING: Moderate - several treatment options, min-mod task modification necessary   REHAB POTENTIAL: Good   EVALUATION COMPLEXITY: Moderate      PLAN: OT FREQUENCY: 1x/week   OT DURATION: 12 weeks   PLANNED INTERVENTIONS: self care/ADL training, therapeutic exercise, therapeutic activity,  neuromuscular re-education, manual therapy, passive range of motion, and paraffin   RECOMMENDED OTHER SERVICES:    CONSULTED AND AGREED WITH PLAN OF CARE: Patient   PLAN FOR NEXT SESSION:  Left hand strengthening and coordination exercises  Olegario Messier, MS, OTR/L    09/15/23, 10:30 AM

## 2023-09-17 ENCOUNTER — Ambulatory Visit: Payer: Medicare HMO | Admitting: Occupational Therapy

## 2023-09-22 ENCOUNTER — Ambulatory Visit: Payer: Medicare HMO | Attending: Adult Health | Admitting: Occupational Therapy

## 2023-09-22 DIAGNOSIS — M6281 Muscle weakness (generalized): Secondary | ICD-10-CM | POA: Diagnosis present

## 2023-09-22 DIAGNOSIS — R278 Other lack of coordination: Secondary | ICD-10-CM | POA: Diagnosis present

## 2023-09-22 NOTE — Therapy (Signed)
 Occupational Therapy Progress Note  Dates of reporting period  07/28/2023   to   09/22/2023    Patient Name: Alyssa Mayo MRN: [id number] DOB:April 28, 1946, 78 y.o., female Today's Date: 03/14/2022   PCP: [name], MD REFERRING PROVIDER: [name], NP     OT End of Session - 09/22/23 1025     Visit Number 60    Number of Visits 81    Date for OT Re-Evaluation 11/17/23    Authorization Type Progress reporting period starting 06/16/2023    OT Start Time 1015    OT Stop Time 1100    OT Time Calculation (min) 45 min    Activity Tolerance Patient tolerated treatment well    Behavior During Therapy [facility] for tasks assessed/performed                          Past Medical History:  Diagnosis Date   Abnormal levels of other serum enzymes 07/17/2015   Arthritis     Constipation 07/11/2015   COVID-19 03/06/2021   Diabetes mellitus without complication (HCC)     Elevated liver enzymes     Fatty liver     Generalized abdominal pain 07/11/2015   Hyperlipidemia     Hypertension     Lifelong obesity     Macular degeneration     Morbid obesity due to excess calories (HCC) 07/11/2015   Other fatigue 07/11/2015   Sleep apnea     Spinal headache           Past Surgical History:  Procedure Laterality Date   ABDOMINAL HYSTERECTOMY       APPENDECTOMY       CATARACT EXTRACTION       CERVICAL LAMINECTOMY   07/21/1985   CESAREAN SECTION       COLONOSCOPY   07/21/2012    diverticulosis, [facility] Dr. [name]    COLONOSCOPY WITH PROPOFOL N/A 02/04/2019    Procedure: COLONOSCOPY WITH PROPOFOL;  Surgeon: [name], MD;  Location: [facility] ENDOSCOPY;  Service: Endoscopy;  Laterality: N/A;   COLONOSCOPY WITH PROPOFOL N/A 03/18/2021    Procedure: COLONOSCOPY WITH PROPOFOL;  Surgeon: [name], MD;  Location: [facility] ENDOSCOPY;  Service: Endoscopy;  Laterality: N/A;  DM   DIAGNOSTIC LAPAROSCOPY       ESOPHAGOGASTRODUODENOSCOPY (EGD) WITH PROPOFOL N/A 02/04/2019     Procedure: ESOPHAGOGASTRODUODENOSCOPY (EGD) WITH PROPOFOL;  Surgeon: [name], MD;  Location[facility] ENDOSCOPY;  Service: Endoscopy;  Laterality: N/A;   ESOPHAGOGASTRODUODENOSCOPY (EGD) WITH PROPOFOL N/A 03/18/2021    Procedure: ESOPHAGOGASTRODUODENOSCOPY (EGD) WITH PROPOFOL;  Surgeon: [name], MD;  Location: [facility] ENDOSCOPY;  Service: Endoscopy;  Laterality: N/A;   LOOP RECORDER INSERTION N/A 08/05/2021    Procedure: LOOP RECORDER INSERTION;  Surgeon: [name], MD;  [facility]: [facility] INVASIVE CV LAB;  Service: Cardiovascular;  Laterality: N/A;   SPINE SURGERY       TUBAL LIGATION            Patient Active Problem List    Diagnosis Date Noted   Paroxysmal atrial fibrillation (HCC) 08/08/2022   Hypercoagulable state due to paroxysmal atrial fibrillation (HCC) 08/08/2022   Cerebral edema (HCC) 08/07/2021   OSA (obstructive sleep apnea) 08/07/2021   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   CVA (cerebral vascular accident) (HCC) 08/01/2021   GERD (gastroesophageal reflux disease) 07/20/2019   Advanced care planning/counseling discussion 12/17/2016   Skin lesions, generalized 11/13/2016   Chronic right hip  pain 06/17/2016   Vitamin D deficiency 09/26/2015   Diastasis recti 08/08/2015   Elevated serum GGT level 07/24/2015   Essential hypertension 07/17/2015   Fatty liver 07/17/2015   Elevated alkaline phosphatase level 07/17/2015   Elevated serum glutamic pyruvic transaminase (SGPT) level 07/17/2015   Abnormal finding on EKG 07/11/2015   Morbid obesity (HCC) 07/11/2015   Colon, diverticulosis 07/11/2015   Abdominal wall hernia 07/11/2015   DM (diabetes mellitus), type 2 with neurological complications (HCC)     Hyperlipidemia      ONSET DATE: 08/01/2021   REFERRING DIAG: CVA   THERAPY DIAG:  Muscle weakness (generalized)   Rationale for Evaluation and Treatment Rehabilitation   SUBJECTIVE:   SUBJECTIVE STATEMENT:       Pt. reports having taken  a day trip to [place] this week.  Pt accompanied by: self   PERTINENT HISTORY:  Pt. is a 78 y.o. female who had a unexpectedly hospitalized from 2/12-2/14/2024 for COVID-19, and a UTI.  Pt. 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 Pt. attended inpatient rehab from 08/07/2021-08/22/2021. Pt. received Home health therapy services this past spring.  Pt. has recently had an assessment through driver rehabilitation services at [facility] with recommendations for referrals for outpatient OT/PT, and ST services. Pt. PMHx includes: HTN, Hyperlipidemia, Macular degeneration, DM, and Obesity.   PRECAUTIONS: None   WEIGHT BEARING RESTRICTIONS No   PAIN: Are you having pain? >1/10 (.5/10) in the left  shoulder, and hip.  FALLS: Has patient fallen in last 6 months? Yes.   LIVING ENVIRONMENT: Lives with: lives with their family  Son  Alyssa Mayo Lives in: House/apartment Main living are on one floor Stairs:  2 steps to enter Has following equipment at home: Single point cane, Wheelchair (manual), shower chair, Grab bars, bed rail, and rubber mat.   PLOF: Independent   PATIENT GOALS: To be able to Drive, and cook again   OBJECTIVE:    HAND DOMINANCE: Right    ADLs:  Transfers/ambulation related to ADLs: Independent Eating: Independent Grooming: [employer] Pt. reports being unable to use her left hand to perform haircare.  UB Dressing: Independent pullover shirt, independent now with fastening a bra LB Dressing: Independent donning pants socks, and slide on shoes Toileting: Independent Bathing: Independent Tub Shower transfers: Walk-in shower Independent now    IADLs: Shopping: Needs to be accompanied to the grocery store. Light housekeeping: Do  Meal Prep:  Is able to perform light meal prep Community mobility: Relies on son, and friend Medication management: Son sets up Mining engineer management:TBD Handwriting: TBD   MOBILITY  STATUS: Hx of falls out of bed     ACTIVITY TOLERANCE: Activity tolerance:  10-20 min. Before rest break   FUNCTIONAL OUTCOME MEASURES: FOTO: 61  TR score: 62  10/09/2022: FOTO: 49  (Recert) 06/02/23: 65  07/28/2023:  FOTO score: 62   UPPER EXTREMITY ROM      Active ROM Right eval Left eval Left  10/09/2022 Left 10/23/22 Left 12/02/2022 Left 01/13/2023 Left  03/10/2023 Left 04/21/23 Left 05/12/23 Left 06/02/23 Left 06/16/23 Left 07/28/23 Left 08/25/23 Left 09/22/23  Shoulder flexion WFL 54(74) scaption 0(55) 26(80) 32(82) 35(82) 35(85) 40(87) 70(96) 77 (104) 80(108) 83(108) 85(108) 87(108)  Shoulder abduction WFL 58(75) 32(54) 32(50) 45(60) 47(70) 62(72) 48(76) 73(102) 70 (100) 76(106) 80(110) 84(110) 86(110)  Shoulder adduction                  Shoulder extension  Shoulder internal rotation            Thumb to L3 (T10) Thumb to L3 (T10) Thumb L3 (T10)  Thumb to L3(T10)  Shoulder external rotation            (With elbow at side) 45 (60) (With elbow at side) 47 (60) (With elbow at side) 47 With elbow at side) 48 With elbow at side 48  Elbow flexion [facility] WFL 145 145 145 145 145 147  WFL [facility] WFL WFL  Elbow extension [facility] WFL 10(0) 10(0) WFL WFL [facility]  [facility] WFL  Wrist flexion WFL TBD [facility] WFL [facility]  [facility] WFL WFL  Wrist extension WFL TBD [facility]  [facility] WFL WFL  Wrist ulnar deviation                  Wrist radial deviation                  Wrist pronation             [facility] WFL WFL  Wrist supination             [facility] WFL WFL  (Blank rows = not tested)     UPPER EXTREMITY MMT:      MMT Right eval Left eval Left 10/09/2022 Left 10/23/2022 Left 12/02/2022 Left 01/13/2023 Left 03/10/2023 Left 04/21/23 Left 06/02/23 Left 06/16/23 Left 07/28/23 Left 08/25/23 Left 09/22/23  Shoulder flexion 4+/5 2+/5 1/5 2-/5 2-/5 2-/5 2-/5 2-/5 2- 2+/5 3-/5 3-/5 3-/5  Shoulder abduction 4+/5 2+/5 2-/5 2-/5 2-/5 2-/5 2-/5 2-/5  2- 2+/5 3-/5 3-/5 3-/5  Shoulder adduction                 Shoulder extension                 Shoulder internal rotation           4- 4-/5  4/5 4+/5  Shoulder external rotation           3- 3-/5  3-/5 3-/5  Middle trapezius                 Lower trapezius                 Elbow flexion 4+/5 3+/5 3/5 3/5 3+/5 3+/5 4/5 4/5 4+/5 4+/5 4+/5 5/5 5/5  Elbow extension 4+/5 3+/5 3/5 3/5 3+/5 3+/5 4/5 4/5 4/5 4+/5 4+/5 4+/5 5/5  Wrist flexion 4+/5 TBD 3/5 3/5 3+/5 3+/5 4/5 4/5 4+/5 4+/5 4+/5 4+/5 5/5  Wrist extension 4+/5 TBD 3/5 3/5 3+/5 3+/5 4/5 4/5 4/5 4/5 4+/5 4+/5 5/5  Wrist ulnar deviation                 Wrist radial deviation                 Wrist pronation                 Wrist supination                 (Blank rows = not tested)   HAND FUNCTION: Grip strength: Right: 33 lbs; Left: 11 lbs, Lateral pinch: Right: 17 lbs, Left: 2 lbs, and 3 point pinch: Right: 17 lbs, Left: 2 lbs  10/09/2022: Grip strength: Right: 33 lbs; Left: 10 lbs, Lateral pinch: Right: 17 lbs, Left: 1 lbs, and 3 point  pinch: Right: 17 lbs, Left: 1 lbs   10/23/2022: Grip strength: Right: 33 lbs; Left: 16 lbs, Lateral pinch: Right: 17 lbs, Left: 1 lbs, and 3 point pinch: Right: 17 lbs, Left: 1 lbs  12/02/2022: Grip strength: Right: 33 lbs; Left: 17 lbs, Lateral pinch: Right: 17 lbs, Left: 7 lbs, and 3 point pinch: Right: 17 lbs, Left: 3 lbs   01/13/2023: Grip strength: Right: 33 lbs; Left: 15 lbs, Lateral pinch: Right: 17 lbs, Left: 9 lbs, and 3 point pinch: Right: 17 lbs, Left: 7 lbs  03/10/2023: Grip strength: Right: 33 lbs; Left: 15 lbs, Lateral pinch: Right: 17 lbs, Left: 9 lbs, and 3 point pinch: Right: 17 lbs, Left: 7 lbs   04/21/2023: Grip strength: Right: 34 lbs; Left: 13 lbs, Lateral pinch: NT 3 point pinch: Right: NT  06/02/23: Grip strength: Right: 34 lbs; Left: 19 lbs, Lateral pinch: Right: 16 lbs; Left: 6 lbs; 3 point pinch: Right: 13 lbs, Left: 7 lbs   06/16/2023: Grip strength: Right: 39 lbs; Left: 19 lbs,  Lateral pinch: Right: 16 lbs; Left: 5 lbs; 3 point pinch: Right: 13 lbs, Left: 9 lbs   07/28/2023: Grip strength: Right: 39 lbs; Left: 20 lbs, Lateral pinch: Right: 16 lbs; Left: 7 lbs; 3 point pinch: Right: 13 lbs, Left: 9 lbs    08/25/2023: Grip strength: Right: 39 lbs; Left: 20 lbs, Lateral pinch: Right: 16 lbs; Left: 7 lbs; 3 point pinch: Right: 13 lbs, Left: 9 lbs   09/22/2023: Grip strength: Right: 39 lbs; Left: 20 lbs, Lateral pinch: Right: 16 lbs; Left: 7 lbs; 3 point pinch: Right: 13 lbs, Left: 9 lbs                        COORDINATION:   9 Hole Peg test: Right: 27 sec.  sec; Left: 52 sec.  10/09/2022: 9 Hole Peg test: Right: 27 sec.  sec; Left: 1 min. & 35 sec.  01/13/2023: 9 Hole Peg test: Right: 27 sec.  sec; Left: 51. Sec.  03/10/2023: 9 Hole Peg test: Right: 27 sec.  sec; Left: 54 Sec.  04/21/2023: 9 Hole Peg test: Right: 27 sec.  sec; Left: 46 Sec.  06/02/23: 9 hole Peg test: Left: 47 sec  06/15/23: 9 hole Peg test: Left: 45 sec  07/28/2023: 9 hole Peg test: Left: 44 sec  08/25/2023: 9 hole Peg test: Left: 39 sec  09/22/23: 9 hole peg test: Left: 38 sec,     SENSATION: Light touch: WFL Proprioception: WFL   COGNITION: Overall cognitive status: Within functional limits for tasks assessed   VISION: Subjective report: Glasses. Just received a new prescription to increase bifocal strength Baseline vision: Macular Degeneration Visual history: macular degeneration   VISION ASSESSMENT: To be further assessed in functional context  Left sided Awareness     PERCEPTION: Limited left sided awareness   TODAY'S TREATMENT:   There. Ex.:   -UE strengthening  using the  SciFit for 8 min. with constant monitoring of the BUE on level 4.0   Therapeutic Activities:  -Facilitated functional reaching using the shape tower. Pt. grasped and moved the shapes through 2/4 vertical dowels of varying heights.   Neuromuscular re-education:  -Facilitated left hand [facility]  skills grasping 1/2" small circular tipped pegs. -Worked on using the the left hand for storage-grasping, and storing the pegs in the palm of her hand. -Performed translatory movements moving the pegs through her hand from the palm to the tip of the 2nd  digit, and thumb in preparation for placing them into a pegboard positioned flat at the tabletop.      PATIENT EDUCATION: Education details: UE strengthening, FMC, functional reach Person educated: Patient Education method: verbal cues Education comprehension: verbalized understanding    HOME EXERCISE PROGRAM:  Reviewed home activities to enhance left hand digit extension during typing skills.   GOALS: Goals reviewed with patient? Yes     SHORT TERM GOALS: Target date: 07/21/23   Pt. Will improve FOTO score by 2 points to reflect improved pt. perceived functional performance Baseline: 07/28/2023: FOTO score: 62, 06/16/2023: FOTO score: 70 06/02/23: 65; 04/21/2023: 49 03/10/2023: 59 01/13/2023: 49 12/01/2022: FOTO: 51 10/23/2022: FOTO 49 10/09/2022: FOTO 49, FOTO: 61 TR score: 62 Goal status:  Goal discharged   LONG TERM GOALS: Target date: 11/17/2023   Pt. Will improve left shoulder ROM by 10 degrees to be able able to efficiently apply deodorant Baseline: 09/22/23: shoulder flexion 87(108), abd 86(110)  With increased time to complete 08/25/23: shoulder flexion 85(108), abd 84(110)  07/28/2023: shoulder flexion 83(108), abd 80(110)  06/16/23: shoulder flexion 80 (108), abd 76(106) Pt. Is able to apply deodorant to the left, however takes increased time.; 06/02/23: shoulder flexion 77 (104), abd 70 (100); 04/21/2023: shoulder flexion: 40(87), Abduction: 48(76) 03/10/2023: Shoulder flexion: 35(85) 01/13/2023:  Shoulder flexion: 35(82), Abduction: 47(70) 12/02/2022: Shoulder flexion: 32(82), Abduction: 45(60)  10/23/2022: Shoulder flexion: 26(80), Abduction: 32(50) 10/09/2022: flexion: 0(55), Abduction: 32(54) Goal status: Improving, ongoing   2.  Pt.  will be able to independently hold and use a blow dryer, and brush for hair care. Baseline: 09/22/23: Pt. Is able to reach up higher to blow dry her hair, however has difficulty  with combinations of distal movements while sustaining the UE in elevation.  08/25/23: Pt. Continues to be able to hold a blow dryer, and is able to move the blow dryer within her hand more efficiently, however reaches only to L side of head and lower part of back of head with brush and blow dryer d/t limited ER ROM and strength 07/28/2023: Pt can hold a blow dryer, and is able to move the blow dryer within her hand, however reaches only to L side of head and lower part of back of head with brush and blow dryer d/t limited ER ROM and strength11/26/2024: shoulder flexion 80 (108), abd 76(106) 06/02/23: Pt can hold a blow dryer but reaches only to L side of head and lower part of back of head with brush and blow dryer d/t limited ER ROM and strength; 04/21/2023: Pt. is able to hold the hair dryer in the left hand, however is unable to reach up to dry it, and has difficulty using a brush with the left UE. 03/10/2023: Pt. continues to be able to hold the hair dryer in the left hand, however is unable to reach up to dry it, and has difficulty using a brush 01/13/2023: Pt. Can hold the hair dryer in the left hand, however is unable to reach up to dry it, and has difficulty using a brush. 12/02/2022: Pt. is unable to actively elevate he left shoulder in order to blow dry her hair. 10/23/2022: Pt. continues to be unable to hold a blow dryer. Eval: Pt. Is unable to sustain her BUEs in elevation, and use a blow dryer, and brush. 10/09/2022: Pt. Is unable to hold a blow dryer. Eval: Pt. Is unable to sustain her BUEs in elevation, and use a blow dryer, and brush. Goal status: improving/ongoing  3.  Pt. Will improve left lateral pinch strength by 3# to be able to independently cut meat. Baseline: 3/0/4/25: Left Lateral Pinch  7# 08/25/2023: Lateral Pinch  strength: 7# 07/28/2023:  Lateral pinch: Left: 7 lbs.06/16/2023: Lateral pinch: Left: 5 lbs. Pt. Has difficulty cutting steak, however is able to cut tender pieces of chicken. 06/02/23: L lateral pinch: 6# (pt reports she lacks the strength to cut steak but can cut tender chicken); 04/21/2023: NT(Pinch Meter sent out for calibration) Pt. continues to have difficulty cutting meat. 03/10/2023: Left lateral pinch strength: 9#   01/13/2023: Left lateral pinch strength: 9#  12/02/2022: Left pinch: 7# Pt. has difficulty cutting meat. 10/23/2022: Pt. Continues to present with difficulty cutting meat. 10/09/2022: 1#  Eval: 2#. Pt. has difficulty stabilizing utensil, and food while cutting food. Goal status: Discontinued   4.  Pt. Will improve Left hand [facility] [employer] skills to be able to be able to independently, and efficiently manipulate small objects during ADL tasks. Baseline: 09/22/23: 38 sec.08/25/2023: 39 sec. 1/0/2025: Left FMC skills: 44 sec. 06/16/2023: Left 45 sec. 06/02/2023: L 47 sec; 04/21/2023:  46 sec. 03/10/2023: 54 sec. 01/13/2023: 51 sec. 12/02/2022: TBD  10/23/2022: Pt. Continues to present with difficulty manipulating small objects. 10/09/2022: Left: 1 min & 35 sec.: Eval: Left: 52 sec. Right: 27 sec. Goal status: Improving, Ongoing    6.: Pt. will improve with left grip strength to be able to hold and pull up pants Baseline: 09/22/23: Left grip strength 20# 08/25/23: Left  grip strength 20# independent hiking pants. 07/28/2023: Left grip strength: 20# 06/16/2023: Left grip strength: 19# Pt. Is now able to pull her pants up.06/02/23: L grip 19#, improving; 04/21/2023: Pt. is able to pull up covers, however has difficulty pulling up pants. 03/10/2023: left  grip strength: 15# 01/13/2023: left  grip strength: 15# 12/02/2022: 17# 10/23/2022: Left: 16# 10/09/2022: Left: 10# Eval:  Left grip strength 11#. Pt. has difficulty holding a full drink  securely with the left hand.             Goal status: Partially met. Discontinue    7. Pt. will improve typing speed, and accuracy in preparation for efficiently typing simple email message. Baseline: 09/22/23: Pt. Is now using the laptop more. 08/25/2023: TBD 07/28/2023: Pt. now has a working laptop available for her to use. 06/16/2023:  Limited ability to carry-over practice at home due to Pt.'s mouse at home is not working. Pt. Also can not see the keys on her computer at home. 06/02/23: same as 04/21/23; 04/21/2023:  Pt. continues to present with limited speed, and accuracy with typing  03/10/2023: Pt. continues to present with limited speed, and accuracy with typing 01/13/2023: TBD 12/02/2022:  Pt. continues to present with limited speed, and accuracy with typing.10/23/2022: Pt. continues to present with limited speed, and accuracy with typing. 10/09/2022: Pt. presents with difficulty typing. Pt. presents with difficulty typing an email. Typing speed, and accuracy TBD             Goal status: Ongoing                                CLINICAL IMPRESSION:  Pt. is making steady progress with AROM in left shoulder flexion, and abduction and has increased ROM over the past progress reporting period. Pt. is now able to efficiently reach out to turn on the kitchen faucet. Pt. is now able to reach higher during hair care, however  has difficulty combining distal movements while the Left shoulder is sustained in elevation. Pt. continues to benefit from OT services to work towards improving Left UE functioning in order to maximize engagement in, and overall independence with ADLs, and IADL tasks, while reducing risk of adhesive capsulitis.     PERFORMANCE DEFICITS in functional skills including ADLs, IADLs, coordination, dexterity, sensation, ROM, strength, FMC, decreased knowledge of use of DME, vision, and UE functional use, cognitive skills including attention, and psychosocial skills including coping strategies, environmental adaptation, habits, and routines and behaviors.    IMPAIRMENTS are  limiting patient from ADLs, IADLs, and social participation.    COMORBIDITIES may have co-morbidities  that affects occupational performance. Patient will benefit from skilled OT to address above impairments and improve overall function.   MODIFICATION OR ASSISTANCE TO COMPLETE EVALUATION: Min-Moderate modification of tasks or assist with assess necessary to complete an evaluation.   OT OCCUPATIONAL PROFILE AND HISTORY: Detailed assessment: Review of records and additional review of physical, cognitive, psychosocial history related to current functional performance.   CLINICAL DECISION MAKING: Moderate - several treatment options, min-mod task modification necessary   REHAB POTENTIAL: Good   EVALUATION COMPLEXITY: Moderate      PLAN: OT FREQUENCY: 1x/week   OT DURATION: 12 weeks   PLANNED INTERVENTIONS: self care/ADL training, therapeutic exercise, therapeutic activity, neuromuscular re-education, manual therapy, passive range of motion, and paraffin   RECOMMENDED OTHER SERVICES:    CONSULTED AND AGREED WITH PLAN OF CARE: Patient   PLAN FOR NEXT SESSION:  Left hand strengthening and coordination exercises  Olegario Messier, MS, OTR/L    09/22/23, 10:33 AM

## 2023-09-24 ENCOUNTER — Ambulatory Visit: Payer: Medicare HMO | Admitting: Occupational Therapy

## 2023-09-29 ENCOUNTER — Ambulatory Visit: Payer: Medicare HMO | Admitting: Occupational Therapy

## 2023-10-01 ENCOUNTER — Ambulatory Visit: Admitting: Occupational Therapy

## 2023-10-01 ENCOUNTER — Ambulatory Visit: Payer: Medicare HMO | Admitting: Occupational Therapy

## 2023-10-01 DIAGNOSIS — R278 Other lack of coordination: Secondary | ICD-10-CM

## 2023-10-01 DIAGNOSIS — M6281 Muscle weakness (generalized): Secondary | ICD-10-CM | POA: Diagnosis not present

## 2023-10-01 NOTE — Therapy (Addendum)
 Occupational Therapy Neuro Treatment Note    Patient Name: Alyssa Mayo MRN: [id number] DOB:[birth year], 78 y.o., female Today's Date: 03/14/2022   PCP: Alyssa Mina, MD REFERRING PROVIDER: [name], NP     OT End of Session - 10/01/23 1551     Visit Number 61    Number of Visits 81    Date for OT Re-Evaluation 11/17/23    Authorization Type Progress reporting period starting 09/22/23    OT Start Time 1100    OT Stop Time 1145    OT Time Calculation (min) 45 min    Activity Tolerance Patient tolerated treatment well    Behavior During Therapy [facility] for tasks assessed/performed                          Past Medical History:  Diagnosis Date   Abnormal levels of other serum enzymes 07/17/2015   Arthritis     Constipation 07/11/2015   COVID-19 03/06/2021   Diabetes mellitus without complication (HCC)     Elevated liver enzymes     Fatty liver     Generalized abdominal pain 07/11/2015   Hyperlipidemia     Hypertension     Lifelong obesity     Macular degeneration     Morbid obesity due to excess calories (HCC) 07/11/2015   Other fatigue 07/11/2015   Sleep apnea     Spinal headache           Past Surgical History:  Procedure Laterality Date   ABDOMINAL HYSTERECTOMY       APPENDECTOMY       CATARACT EXTRACTION       CERVICAL LAMINECTOMY   07/21/1985   CESAREAN SECTION       COLONOSCOPY   07/21/2012    diverticulosis, [facility] Alyssa Mayo    COLONOSCOPY WITH PROPOFOL N/A 02/04/2019    Procedure: COLONOSCOPY WITH PROPOFOL;  Surgeon: Alyssa Deem, MD;  Location: [facility] ENDOSCOPY;  Service: Endoscopy;  Laterality: N/A;   COLONOSCOPY WITH PROPOFOL N/A 03/18/2021    Procedure: COLONOSCOPY WITH PROPOFOL;  Surgeon: Alyssa Bill, MD;  Location: [facility] ENDOSCOPY;  Service: Endoscopy;  Laterality: N/A;  DM   DIAGNOSTIC LAPAROSCOPY       ESOPHAGOGASTRODUODENOSCOPY (EGD) WITH PROPOFOL N/A 02/04/2019    Procedure: ESOPHAGOGASTRODUODENOSCOPY (EGD) WITH  PROPOFOL;  Surgeon: Alyssa Deem, MD;  Location: [facility] ENDOSCOPY;  Service: Endoscopy;  Laterality: N/A;   ESOPHAGOGASTRODUODENOSCOPY (EGD) WITH PROPOFOL N/A 03/18/2021    Procedure: ESOPHAGOGASTRODUODENOSCOPY (EGD) WITH PROPOFOL;  Surgeon: Alyssa Bill, MD;  Location: [facility] ENDOSCOPY;  Service: Endoscopy;  Laterality: N/A;   LOOP RECORDER INSERTION N/A 08/05/2021    Procedure: LOOP RECORDER INSERTION;  Surgeon: Alyssa Maw, MD;  Location: [facility] INVASIVE CV LAB;  Service: Cardiovascular;  Laterality: N/A;   SPINE SURGERY       TUBAL LIGATION            Patient Active Problem List    Diagnosis Date Noted   Paroxysmal atrial fibrillation (HCC) 08/08/2022   Hypercoagulable state due to paroxysmal atrial fibrillation (HCC) 08/08/2022   Cerebral edema (HCC) 08/07/2021   OSA (obstructive sleep apnea) 08/07/2021   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   CVA (cerebral vascular accident) (HCC) 08/01/2021   GERD (gastroesophageal reflux disease) 07/20/2019   Advanced care planning/counseling discussion 12/17/2016   Skin lesions, generalized 11/13/2016   Chronic right hip pain 06/17/2016   Vitamin D deficiency 09/26/2015   Diastasis recti  08/08/2015   Elevated serum GGT level 07/24/2015   Essential hypertension 07/17/2015   Fatty liver 07/17/2015   Elevated alkaline phosphatase level 07/17/2015   Elevated serum glutamic pyruvic transaminase (SGPT) level 07/17/2015   Abnormal finding on EKG 07/11/2015   Morbid obesity (HCC) 07/11/2015   Colon, diverticulosis 07/11/2015   Abdominal wall hernia 07/11/2015   DM (diabetes mellitus), type 2 with neurological complications (HCC)     Hyperlipidemia      ONSET DATE: 08/01/2021   REFERRING DIAG: CVA   THERAPY DIAG:  Muscle weakness (generalized)   Rationale for Evaluation and Treatment Rehabilitation   SUBJECTIVE:   SUBJECTIVE STATEMENT:       Pt. reports  that she forgot her grandson's birthday last week, and  always sends him balloons.  Pt accompanied by: self   PERTINENT HISTORY:  Pt. is a 78 y.o. female who had a unexpectedly hospitalized from 2/12-2/14/2024 for COVID-19, and a UTI.  Pt. 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 Pt. attended inpatient rehab from 08/07/2021-08/22/2021. Pt. received Home health therapy services this past spring.  Pt. has recently had an assessment through driver rehabilitation services at [facility] with recommendations for referrals for outpatient OT/PT, and ST services. Pt. PMHx includes: HTN, Hyperlipidemia, Macular degeneration, DM, and Obesity.   PRECAUTIONS: None   WEIGHT BEARING RESTRICTIONS No   PAIN: Are you having pain? >1/10 (.5/10) in the left  shoulder, and hip.  FALLS: Has patient fallen in last 6 months? Yes.   LIVING ENVIRONMENT: Lives with: lives with their family  Son  Alyssa Mayo Lives in: House/apartment Main living are on one floor Stairs:  2 steps to enter Has following equipment at home: Single point cane, Wheelchair (manual), shower chair, Grab bars, bed rail, and rubber mat.   PLOF: Independent   PATIENT GOALS: To be able to Drive, and cook again   OBJECTIVE:    HAND DOMINANCE: Right    ADLs:  Transfers/ambulation related to ADLs: Independent Eating: Independent Grooming: [employer] Pt. reports being unable to use her left hand to perform haircare.  UB Dressing: Independent pullover shirt, independent now with fastening a bra LB Dressing: Independent donning pants socks, and slide on shoes Toileting: Independent Bathing: Independent Tub Shower transfers: Walk-in shower Independent now    IADLs: Shopping: Needs to be accompanied to the grocery store. Light housekeeping: Do  Meal Prep:  Is able to perform light meal prep Community mobility: Relies on son, and friend Medication management: Son sets up Mining engineer management:TBD Handwriting: TBD   MOBILITY STATUS: Hx of  falls out of bed     ACTIVITY TOLERANCE: Activity tolerance:  10-20 min. Before rest break   FUNCTIONAL OUTCOME MEASURES: FOTO: 61  TR score: 62  10/09/2022: FOTO: 49  (Recert) 06/02/23: 65  07/28/2023:  FOTO score: 62   UPPER EXTREMITY ROM      Active ROM Right eval Left eval Left  10/09/2022 Left 10/23/22 Left 12/02/2022 Left 01/13/2023 Left  03/10/2023 Left 04/21/23 Left 05/12/23 Left 06/02/23 Left 06/16/23 Left 07/28/23 Left 08/25/23 Left 09/22/23  Shoulder flexion WFL 54(74) scaption 0(55) 26(80) 32(82) 35(82) 35(85) 40(87) 70(96) 77 (104) 80(108) 83(108) 85(108) 87(108)  Shoulder abduction WFL 58(75) 32(54) 32(50) 45(60) 47(70) 62(72) 48(76) 73(102) 70 (100) 76(106) 80(110) 84(110) 86(110)  Shoulder adduction                  Shoulder extension  Shoulder internal rotation            Thumb to L3 (T10) Thumb to L3 (T10) Thumb L3 (T10)  Thumb to L3(T10)  Shoulder external rotation            (With elbow at side) 45 (60) (With elbow at side) 47 (60) (With elbow at side) 47 With elbow at side) 48 With elbow at side 48  Elbow flexion [facility] WFL 145 145 145 145 145 147  WFL [facility] WFL WFL  Elbow extension [facility] WFL 10(0) 10(0) WFL WFL [facility]  [facility] WFL  Wrist flexion WFL TBD [facility] WFL [facility] WFL WFL  Wrist extension WFL TBD [facility]  [facility] WFL WFL  Wrist ulnar deviation                  Wrist radial deviation                  Wrist pronation             [facility] WFL WFL  Wrist supination             [facility] WFL WFL  (Blank rows = not tested)     UPPER EXTREMITY MMT:      MMT Right eval Left eval Left 10/09/2022 Left 10/23/2022 Left 12/02/2022 Left 01/13/2023 Left 03/10/2023 Left 04/21/23 Left 06/02/23 Left 06/16/23 Left 07/28/23 Left 08/25/23 Left 09/22/23  Shoulder flexion 4+/5 2+/5 1/5 2-/5 2-/5 2-/5 2-/5 2-/5 2- 2+/5 3-/5 3-/5 3-/5  Shoulder abduction 4+/5 2+/5 2-/5 2-/5 2-/5 2-/5 2-/5 2-/5 2- 2+/5 3-/5  3-/5 3-/5  Shoulder adduction                 Shoulder extension                 Shoulder internal rotation           4- 4-/5  4/5 4+/5  Shoulder external rotation           3- 3-/5  3-/5 3-/5  Middle trapezius                 Lower trapezius                 Elbow flexion 4+/5 3+/5 3/5 3/5 3+/5 3+/5 4/5 4/5 4+/5 4+/5 4+/5 5/5 5/5  Elbow extension 4+/5 3+/5 3/5 3/5 3+/5 3+/5 4/5 4/5 4/5 4+/5 4+/5 4+/5 5/5  Wrist flexion 4+/5 TBD 3/5 3/5 3+/5 3+/5 4/5 4/5 4+/5 4+/5 4+/5 4+/5 5/5  Wrist extension 4+/5 TBD 3/5 3/5 3+/5 3+/5 4/5 4/5 4/5 4/5 4+/5 4+/5 5/5  Wrist ulnar deviation                 Wrist radial deviation                 Wrist pronation                 Wrist supination                 (Blank rows = not tested)   HAND FUNCTION: Grip strength: Right: 33 lbs; Left: 11 lbs, Lateral pinch: Right: 17 lbs, Left: 2 lbs, and 3 point pinch: Right: 17 lbs, Left: 2 lbs  10/09/2022: Grip strength: Right: 33 lbs; Left: 10 lbs, Lateral pinch: Right: 17 lbs, Left: 1 lbs, and 3 point  pinch: Right: 17 lbs, Left: 1 lbs   10/23/2022: Grip strength: Right: 33 lbs; Left: 16 lbs, Lateral pinch: Right: 17 lbs, Left: 1 lbs, and 3 point pinch: Right: 17 lbs, Left: 1 lbs  12/02/2022: Grip strength: Right: 33 lbs; Left: 17 lbs, Lateral pinch: Right: 17 lbs, Left: 7 lbs, and 3 point pinch: Right: 17 lbs, Left: 3 lbs   01/13/2023: Grip strength: Right: 33 lbs; Left: 15 lbs, Lateral pinch: Right: 17 lbs, Left: 9 lbs, and 3 point pinch: Right: 17 lbs, Left: 7 lbs  03/10/2023: Grip strength: Right: 33 lbs; Left: 15 lbs, Lateral pinch: Right: 17 lbs, Left: 9 lbs, and 3 point pinch: Right: 17 lbs, Left: 7 lbs   04/21/2023: Grip strength: Right: 34 lbs; Left: 13 lbs, Lateral pinch: NT 3 point pinch: Right: NT  06/02/23: Grip strength: Right: 34 lbs; Left: 19 lbs, Lateral pinch: Right: 16 lbs; Left: 6 lbs; 3 point pinch: Right: 13 lbs, Left: 7 lbs   06/16/2023: Grip strength: Right: 39 lbs; Left: 19 lbs, Lateral pinch:  Right: 16 lbs; Left: 5 lbs; 3 point pinch: Right: 13 lbs, Left: 9 lbs   07/28/2023: Grip strength: Right: 39 lbs; Left: 20 lbs, Lateral pinch: Right: 16 lbs; Left: 7 lbs; 3 point pinch: Right: 13 lbs, Left: 9 lbs    08/25/2023: Grip strength: Right: 39 lbs; Left: 20 lbs, Lateral pinch: Right: 16 lbs; Left: 7 lbs; 3 point pinch: Right: 13 lbs, Left: 9 lbs   09/22/2023: Grip strength: Right: 39 lbs; Left: 20 lbs, Lateral pinch: Right: 16 lbs; Left: 7 lbs; 3 point pinch: Right: 13 lbs, Left: 9 lbs                        COORDINATION:   9 Hole Peg test: Right: 27 sec.  sec; Left: 52 sec.  10/09/2022: 9 Hole Peg test: Right: 27 sec.  sec; Left: 1 min. & 35 sec.  01/13/2023: 9 Hole Peg test: Right: 27 sec.  sec; Left: 51. Sec.  03/10/2023: 9 Hole Peg test: Right: 27 sec.  sec; Left: 54 Sec.  04/21/2023: 9 Hole Peg test: Right: 27 sec.  sec; Left: 46 Sec.  06/02/23: 9 hole Peg test: Left: 47 sec  06/15/23: 9 hole Peg test: Left: 45 sec  07/28/2023: 9 hole Peg test: Left: 44 sec  08/25/2023: 9 hole Peg test: Left: 39 sec  09/22/23: 9 hole peg test: Left: 38 sec,     SENSATION: Light touch: WFL Proprioception: WFL   COGNITION: Overall cognitive status: Within functional limits for tasks assessed   VISION: Subjective report: Glasses. Just received a new prescription to increase bifocal strength Baseline vision: Macular Degeneration Visual history: macular degeneration   VISION ASSESSMENT: To be further assessed in functional context  Left sided Awareness     PERCEPTION: Limited left sided awareness   TODAY'S TREATMENT:   There. Ex.:   -AAROM for the left shoulder  at the tabletop surface -UE strengthening  using the SciFit for 8 min. with constant monitoring of the BUE on level 4.0  -Performed BUE strengthening using a 2# dowel ex. 2/2 to weakness. Bilateral shoulder flexion, chest press, and circular patterns, for 1-2 sets 10 reps each. -BUE strengthening using red  theraband for bilateral shoulder external rotation, elbow flexion, and extension.  -Performed BUE strengthening using 3# hand weight for elbow flexion, and extension, 2# for forearm supination/pronation, wrist flexion/extension, and radial deviation 1-2 sets 10 rep each.  PATIENT EDUCATION: Education details: UE strengthening, FMC, functional reach Person educated: Patient Education method: verbal cues Education comprehension: verbalized understanding    HOME EXERCISE PROGRAM:  Reviewed home activities to enhance left hand digit extension during typing skills.   GOALS: Goals reviewed with patient? Yes     SHORT TERM GOALS: Target date: 07/21/23   Pt. Will improve FOTO score by 2 points to reflect improved pt. perceived functional performance Baseline: 07/28/2023: FOTO score: 62, 06/16/2023: FOTO score: 70 06/02/23: 65; 04/21/2023: 49 03/10/2023: 59 01/13/2023: 49 12/01/2022: FOTO: 51 10/23/2022: FOTO 49 10/09/2022: FOTO 49, FOTO: 61 TR score: 62 Goal status:  Goal discharged   LONG TERM GOALS: Target date: 11/17/2023   Pt. Will improve left shoulder ROM by 10 degrees to be able able to efficiently apply deodorant Baseline: 09/22/23: shoulder flexion 87(108), abd 86(110)  With increased time to complete 08/25/23: shoulder flexion 85(108), abd 84(110)  07/28/2023: shoulder flexion 83(108), abd 80(110)  06/16/23: shoulder flexion 80 (108), abd 76(106) Pt. Is able to apply deodorant to the left, however takes increased time.; 06/02/23: shoulder flexion 77 (104), abd 70 (100); 04/21/2023: shoulder flexion: 40(87), Abduction: 48(76) 03/10/2023: Shoulder flexion: 35(85) 01/13/2023:  Shoulder flexion: 35(82), Abduction: 47(70) 12/02/2022: Shoulder flexion: 32(82), Abduction: 45(60)  10/23/2022: Shoulder flexion: 26(80), Abduction: 32(50) 10/09/2022: flexion: 0(55), Abduction: 32(54) Goal status: Improving, ongoing   2.  Pt. will be able to independently hold and use a blow dryer, and brush for hair  care. Baseline: 09/22/23: Pt. Is able to reach up higher to blow dry her hair, however has difficulty  with combinations of distal movements while sustaining the UE in elevation.  08/25/23: Pt. Continues to be able to hold a blow dryer, and is able to move the blow dryer within her hand more efficiently, however reaches only to L side of head and lower part of back of head with brush and blow dryer d/t limited ER ROM and strength 07/28/2023: Pt can hold a blow dryer, and is able to move the blow dryer within her hand, however reaches only to L side of head and lower part of back of head with brush and blow dryer d/t limited ER ROM and strength11/26/2024: shoulder flexion 80 (108), abd 76(106) 06/02/23: Pt can hold a blow dryer but reaches only to L side of head and lower part of back of head with brush and blow dryer d/t limited ER ROM and strength; 04/21/2023: Pt. is able to hold the hair dryer in the left hand, however is unable to reach up to dry it, and has difficulty using a brush with the left UE. 03/10/2023: Pt. continues to be able to hold the hair dryer in the left hand, however is unable to reach up to dry it, and has difficulty using a brush 01/13/2023: Pt. Can hold the hair dryer in the left hand, however is unable to reach up to dry it, and has difficulty using a brush. 12/02/2022: Pt. is unable to actively elevate he left shoulder in order to blow dry her hair. 10/23/2022: Pt. continues to be unable to hold a blow dryer. Eval: Pt. Is unable to sustain her BUEs in elevation, and use a blow dryer, and brush. 10/09/2022: Pt. Is unable to hold a blow dryer. Eval: Pt. Is unable to sustain her BUEs in elevation, and use a blow dryer, and brush. Goal status: improving/ongoing   3.  Pt. Will improve left lateral pinch strength by 3# to be able to independently cut meat. Baseline: 3/0/4/25:  Left Lateral Pinch  7# 08/25/2023: Lateral Pinch strength: 7# 07/28/2023:  Lateral pinch: Left: 7 lbs.06/16/2023: Lateral  pinch: Left: 5 lbs. Pt. Has difficulty cutting steak, however is able to cut tender pieces of chicken. 06/02/23: L lateral pinch: 6# (pt reports she lacks the strength to cut steak but can cut tender chicken); 04/21/2023: NT(Pinch Meter sent out for calibration) Pt. continues to have difficulty cutting meat. 03/10/2023: Left lateral pinch strength: 9#   01/13/2023: Left lateral pinch strength: 9#  12/02/2022: Left pinch: 7# Pt. has difficulty cutting meat. 10/23/2022: Pt. Continues to present with difficulty cutting meat. 10/09/2022: 1#  Eval: 2#. Pt. has difficulty stabilizing utensil, and food while cutting food. Goal status: Discontinued   4.  Pt. Will improve Left hand [facility] skills to be able to be able to independently, and efficiently manipulate small objects during ADL tasks. Baseline: 09/22/23: 38 sec.08/25/2023: 39 sec. 1/0/2025: Left FMC skills: 44 sec. 06/16/2023: Left 45 sec. 06/02/2023: L 47 sec; 04/21/2023:  46 sec. 03/10/2023: 54 sec. 01/13/2023: 51 sec. 12/02/2022: TBD  10/23/2022: Pt. Continues to present with difficulty manipulating small objects. 10/09/2022: Left: 1 min & 35 sec.: Eval: Left: 52 sec. Right: 27 sec. Goal status: Improving, Ongoing    6.: Pt. will improve with left grip strength to be able to hold and pull up pants Baseline: 09/22/23: Left grip strength 20# 08/25/23: Left  grip strength 20# independent hiking pants. 07/28/2023: Left grip strength: 20# 06/16/2023: Left grip strength: 19# Pt. Is now able to pull her pants up.06/02/23: L grip 19#, improving; 04/21/2023: Pt. is able to pull up covers, however has difficulty pulling up pants. 03/10/2023: left  grip strength: 15# 01/13/2023: left  grip strength: 15# 12/02/2022: 17# 10/23/2022: Left: 16# 10/09/2022: Left: 10# Eval:  Left grip strength 11#. Pt. has difficulty holding a full drink  securely with the left hand.             Goal status: Partially met. Discontinue   7. Pt. will improve typing speed, and accuracy in preparation for  efficiently typing simple email message. Baseline: 09/22/23: Pt. Is now using the laptop more. 08/25/2023: TBD 07/28/2023: Pt. now has a working laptop available for her to use. 06/16/2023:  Limited ability to carry-over practice at home due to Pt.'s mouse at home is not working. Pt. Also can not see the keys on her computer at home. 06/02/23: same as 04/21/23; 04/21/2023:  Pt. continues to present with limited speed, and accuracy with typing  03/10/2023: Pt. continues to present with limited speed, and accuracy with typing 01/13/2023: TBD 12/02/2022:  Pt. continues to present with limited speed, and accuracy with typing.10/23/2022: Pt. continues to present with limited speed, and accuracy with typing. 10/09/2022: Pt. presents with difficulty typing. Pt. presents with difficulty typing an email. Typing speed, and accuracy TBD             Goal status: Ongoing                                CLINICAL IMPRESSION:  Pt. tolerated the UE exercises with no reports of pain. Pt. required verbal cues, and cues for proper hand position/technique with each of the exercises. Pt. Required the left forearm support on the tabletop during the wrist exercises. Pt. continues to benefit from OT services to work towards improving Left UE functioning in order to maximize engagement in, and overall independence with ADLs, and IADL tasks, while reducing  risk of adhesive capsulitis.     PERFORMANCE DEFICITS in functional skills including ADLs, IADLs, coordination, dexterity, sensation, ROM, strength, FMC, decreased knowledge of use of DME, vision, and UE functional use, cognitive skills including attention, and psychosocial skills including coping strategies, environmental adaptation, habits, and routines and behaviors.    IMPAIRMENTS are limiting patient from ADLs, IADLs, and social participation.    COMORBIDITIES may have co-morbidities  that affects occupational performance. Patient will benefit from skilled OT to address above  impairments and improve overall function.   MODIFICATION OR ASSISTANCE TO COMPLETE EVALUATION: Min-Moderate modification of tasks or assist with assess necessary to complete an evaluation.   OT OCCUPATIONAL PROFILE AND HISTORY: Detailed assessment: Review of records and additional review of physical, cognitive, psychosocial history related to current functional performance.   CLINICAL DECISION MAKING: Moderate - several treatment options, min-mod task modification necessary   REHAB POTENTIAL: Good   EVALUATION COMPLEXITY: Moderate      PLAN: OT FREQUENCY: 1x/week   OT DURATION: 12 weeks   PLANNED INTERVENTIONS: self care/ADL training, therapeutic exercise, therapeutic activity, neuromuscular re-education, manual therapy, passive range of motion, and paraffin   RECOMMENDED OTHER SERVICES:    CONSULTED AND AGREED WITH PLAN OF CARE: Patient   PLAN FOR NEXT SESSION:  Left hand strengthening and coordination exercises  Olegario Messier, MS, OTR/L    10/01/23, 3:55 PM

## 2023-10-05 ENCOUNTER — Ambulatory Visit: Payer: Medicare HMO

## 2023-10-05 DIAGNOSIS — I639 Cerebral infarction, unspecified: Secondary | ICD-10-CM

## 2023-10-05 NOTE — Progress Notes (Signed)
 [facility] Summary Report / Loop Recorder

## 2023-10-06 ENCOUNTER — Ambulatory Visit: Payer: Medicare HMO | Admitting: Occupational Therapy

## 2023-10-06 DIAGNOSIS — M6281 Muscle weakness (generalized): Secondary | ICD-10-CM

## 2023-10-06 DIAGNOSIS — R278 Other lack of coordination: Secondary | ICD-10-CM

## 2023-10-06 LAB — CUP PACEART REMOTE DEVICE CHECK
Date Time Interrogation Session: 20250316231024
Implantable Pulse Generator Implant Date: 20230116

## 2023-10-06 NOTE — Therapy (Signed)
 Occupational Therapy Neuro Treatment Note    Patient Name: Alyssa Mayo MRN: [id number] DOB:08-09-1945, 78 y.o., female Today's Date: 03/14/2022   PCP: [name], MD REFERRING PROVIDER: [name], NP     OT End of Session - 10/06/23 1321     Visit Number 62    Number of Visits 81    Date for OT Re-Evaluation 11/17/23    Authorization Type Progress reporting period starting 09/22/23    OT Start Time 1315    OT Stop Time 1400    OT Time Calculation (min) 45 min    Activity Tolerance Patient tolerated treatment well    Behavior During Therapy [facility] for tasks assessed/performed                          Past Medical History:  Diagnosis Date   Abnormal levels of other serum enzymes 07/17/2015   Arthritis     Constipation 07/11/2015   COVID-19 03/06/2021   Diabetes mellitus without complication (HCC)     Elevated liver enzymes     Fatty liver     Generalized abdominal pain 07/11/2015   Hyperlipidemia     Hypertension     Lifelong obesity     Macular degeneration     Morbid obesity due to excess calories (HCC) 07/11/2015   Other fatigue 07/11/2015   Sleep apnea     Spinal headache           Past Surgical History:  Procedure Laterality Date   ABDOMINAL HYSTERECTOMY       APPENDECTOMY       CATARACT EXTRACTION       CERVICAL LAMINECTOMY   07/21/1985   CESAREAN SECTION       COLONOSCOPY   07/21/2012    diverticulosis, [facility] Dr. [name]    COLONOSCOPY WITH PROPOFOL N/A 02/04/2019    Procedure: COLONOSCOPY WITH PROPOFOL;  Surgeon: [name], MD;  Location[facility] ENDOSCOPY;  Service: Endoscopy;  Laterality: N/A;   COLONOSCOPY WITH PROPOFOL N/A 03/18/2021    Procedure: COLONOSCOPY WITH PROPOFOL;  Surgeon: [name], MD;  Location: [facility] ENDOSCOPY;  Service: Endoscopy;  Laterality: N/A;  DM   DIAGNOSTIC LAPAROSCOPY       ESOPHAGOGASTRODUODENOSCOPY (EGD) WITH PROPOFOL N/A 02/04/2019    Procedure: ESOPHAGOGASTRODUODENOSCOPY (EGD) WITH  PROPOFOL;  Surgeon: [name], MD;  Location: [facility] ENDOSCOPY;  Service: Endoscopy;  Laterality: N/A;   ESOPHAGOGASTRODUODENOSCOPY (EGD) WITH PROPOFOL N/A 03/18/2021    Procedure: ESOPHAGOGASTRODUODENOSCOPY (EGD) WITH PROPOFOL;  Surgeon: [name], MD;  Location: [facility] ENDOSCOPY;  Service: Endoscopy;  Laterality: N/A;   LOOP RECORDER INSERTION N/A 08/05/2021    Procedure: LOOP RECORDER INSERTION;  Surgeon: [name], MD;  Location: [facility] INVASIVE CV LAB;  Service: Cardiovascular;  Laterality: N/A;   SPINE SURGERY       TUBAL LIGATION            Patient Active Problem List    Diagnosis Date Noted   Paroxysmal atrial fibrillation (HCC) 08/08/2022   Hypercoagulable state due to paroxysmal atrial fibrillation (HCC) 08/08/2022   Cerebral edema (HCC) 08/07/2021   OSA (obstructive sleep apnea) 08/07/2021   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   CVA (cerebral vascular accident) (HCC) 08/01/2021   GERD (gastroesophageal reflux disease) 07/20/2019   Advanced care planning/counseling discussion 12/17/2016   Skin lesions, generalized 11/13/2016   Chronic right hip pain 06/17/2016   Vitamin D deficiency 09/26/2015   Diastasis recti  08/08/2015   Elevated serum GGT level 07/24/2015   Essential hypertension 07/17/2015   Fatty liver 07/17/2015   Elevated alkaline phosphatase level 07/17/2015   Elevated serum glutamic pyruvic transaminase (SGPT) level 07/17/2015   Abnormal finding on EKG 07/11/2015   Morbid obesity (HCC) 07/11/2015   Colon, diverticulosis 07/11/2015   Abdominal wall hernia 07/11/2015   DM (diabetes mellitus), type 2 with neurological complications (HCC)     Hyperlipidemia      ONSET DATE: 08/01/2021   REFERRING DIAG: CVA   THERAPY DIAG:  Muscle weakness (generalized)   Rationale for Evaluation and Treatment Rehabilitation   SUBJECTIVE:   SUBJECTIVE STATEMENT:       Pt. reports that  she is doing well today  Pt accompanied by: self    PERTINENT HISTORY:  Pt. is a 78 y.o. female who had a unexpectedly hospitalized from 2/12-2/14/2024 for COVID-19, and a UTI.  Pt. 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 Pt. attended inpatient rehab from 08/07/2021-08/22/2021. Pt. received Home health therapy services this past spring.  Pt. has recently had an assessment through driver rehabilitation services at [facility] with recommendations for referrals for outpatient OT/PT, and ST services. Pt. PMHx includes: HTN, Hyperlipidemia, Macular degeneration, DM, and Obesity.   PRECAUTIONS: None   WEIGHT BEARING RESTRICTIONS No   PAIN: Are you having pain?  No pain   FALLS: Has patient fallen in last 6 months? Yes.   LIVING ENVIRONMENT: Lives with: lives with their family  Son  [name] Lives in: House/apartment Main living are on one floor Stairs:  2 steps to enter Has following equipment at home: Single point cane, Wheelchair (manual), shower chair, Grab bars, bed rail, and rubber mat.   PLOF: Independent   PATIENT GOALS: To be able to Drive, and cook again   OBJECTIVE:    HAND DOMINANCE: Right    ADLs:  Transfers/ambulation related to ADLs: Independent Eating: Independent Grooming: [employer] Pt. reports being unable to use her left hand to perform haircare.  UB Dressing: Independent pullover shirt, independent now with fastening a bra LB Dressing: Independent donning pants socks, and slide on shoes Toileting: Independent Bathing: Independent Tub Shower transfers: Walk-in shower Independent now    IADLs: Shopping: Needs to be accompanied to the grocery store. Light housekeeping: Do  Meal Prep:  Is able to perform light meal prep Community mobility: Relies on son, and friend Medication management: Son sets up Mining engineer management:TBD Handwriting: TBD   MOBILITY STATUS: Hx of falls out of bed     ACTIVITY TOLERANCE: Activity tolerance:  10-20 min. Before rest  break   FUNCTIONAL OUTCOME MEASURES: FOTO: 61  TR score: 62  10/09/2022: FOTO: 49  (Recert) 06/02/23: 65  07/28/2023:  FOTO score: 62   UPPER EXTREMITY ROM      Active ROM Right eval Left eval Left  10/09/2022 Left 10/23/22 Left 12/02/2022 Left 01/13/2023 Left  03/10/2023 Left 04/21/23 Left 05/12/23 Left 06/02/23 Left 06/16/23 Left 07/28/23 Left 08/25/23 Left 09/22/23  Shoulder flexion WFL 54(74) scaption 0(55) 26(80) 32(82) 35(82) 35(85) 40(87) 70(96) 77 (104) 80(108) 83(108) 85(108) 87(108)  Shoulder abduction WFL 58(75) 32(54) 32(50) 45(60) 47(70) 62(72) 48(76) 73(102) 70 (100) 76(106) 80(110) 84(110) 86(110)  Shoulder adduction                  Shoulder extension                  Shoulder internal rotation  Thumb to L3 (T10) Thumb to L3 (T10) Thumb L3 (T10)  Thumb to L3(T10)  Shoulder external rotation            (With elbow at side) 45 (60) (With elbow at side) 47 (60) (With elbow at side) 47 With elbow at side) 48 With elbow at side 48  Elbow flexion [facility] WFL 145 145 145 145 145 147  WFL [facility] WFL WFL  Elbow extension [facility] WFL 10(0) 10(0) WFL WFL [facility] WFL  Wrist flexion WFL TBD [facility] WFL [facility] WFL WFL  Wrist extension WFL TBD [facility]  [facility] WFL WFL  Wrist ulnar deviation                  Wrist radial deviation                  Wrist pronation             [facility] WFL WFL  Wrist supination             [facility] WFL WFL  (Blank rows = not tested)     UPPER EXTREMITY MMT:      MMT Right eval Left eval Left 10/09/2022 Left 10/23/2022 Left 12/02/2022 Left 01/13/2023 Left 03/10/2023 Left 04/21/23 Left 06/02/23 Left 06/16/23 Left 07/28/23 Left 08/25/23 Left 09/22/23  Shoulder flexion 4+/5 2+/5 1/5 2-/5 2-/5 2-/5 2-/5 2-/5 2- 2+/5 3-/5 3-/5 3-/5  Shoulder abduction 4+/5 2+/5 2-/5 2-/5 2-/5 2-/5 2-/5 2-/5 2- 2+/5 3-/5 3-/5 3-/5  Shoulder adduction                 Shoulder extension                  Shoulder internal rotation           4- 4-/5  4/5 4+/5  Shoulder external rotation           3- 3-/5  3-/5 3-/5  Middle trapezius                 Lower trapezius                 Elbow flexion 4+/5 3+/5 3/5 3/5 3+/5 3+/5 4/5 4/5 4+/5 4+/5 4+/5 5/5 5/5  Elbow extension 4+/5 3+/5 3/5 3/5 3+/5 3+/5 4/5 4/5 4/5 4+/5 4+/5 4+/5 5/5  Wrist flexion 4+/5 TBD 3/5 3/5 3+/5 3+/5 4/5 4/5 4+/5 4+/5 4+/5 4+/5 5/5  Wrist extension 4+/5 TBD 3/5 3/5 3+/5 3+/5 4/5 4/5 4/5 4/5 4+/5 4+/5 5/5  Wrist ulnar deviation                 Wrist radial deviation                 Wrist pronation                 Wrist supination                 (Blank rows = not tested)   HAND FUNCTION: Grip strength: Right: 33 lbs; Left: 11 lbs, Lateral pinch: Right: 17 lbs, Left: 2 lbs, and 3 point pinch: Right: 17 lbs, Left: 2 lbs  10/09/2022: Grip strength: Right: 33 lbs; Left: 10 lbs, Lateral pinch: Right: 17 lbs, Left: 1 lbs, and 3 point pinch: Right: 17 lbs, Left: 1 lbs   10/23/2022: Grip strength: Right: 33  lbs; Left: 16 lbs, Lateral pinch: Right: 17 lbs, Left: 1 lbs, and 3 point pinch: Right: 17 lbs, Left: 1 lbs  12/02/2022: Grip strength: Right: 33 lbs; Left: 17 lbs, Lateral pinch: Right: 17 lbs, Left: 7 lbs, and 3 point pinch: Right: 17 lbs, Left: 3 lbs   01/13/2023: Grip strength: Right: 33 lbs; Left: 15 lbs, Lateral pinch: Right: 17 lbs, Left: 9 lbs, and 3 point pinch: Right: 17 lbs, Left: 7 lbs  03/10/2023: Grip strength: Right: 33 lbs; Left: 15 lbs, Lateral pinch: Right: 17 lbs, Left: 9 lbs, and 3 point pinch: Right: 17 lbs, Left: 7 lbs   04/21/2023: Grip strength: Right: 34 lbs; Left: 13 lbs, Lateral pinch: NT 3 point pinch: Right: NT  06/02/23: Grip strength: Right: 34 lbs; Left: 19 lbs, Lateral pinch: Right: 16 lbs; Left: 6 lbs; 3 point pinch: Right: 13 lbs, Left: 7 lbs   06/16/2023: Grip strength: Right: 39 lbs; Left: 19 lbs, Lateral pinch: Right: 16 lbs; Left: 5 lbs; 3 point pinch: Right: 13 lbs, Left: 9 lbs    07/28/2023: Grip strength: Right: 39 lbs; Left: 20 lbs, Lateral pinch: Right: 16 lbs; Left: 7 lbs; 3 point pinch: Right: 13 lbs, Left: 9 lbs    08/25/2023: Grip strength: Right: 39 lbs; Left: 20 lbs, Lateral pinch: Right: 16 lbs; Left: 7 lbs; 3 point pinch: Right: 13 lbs, Left: 9 lbs   09/22/2023: Grip strength: Right: 39 lbs; Left: 20 lbs, Lateral pinch: Right: 16 lbs; Left: 7 lbs; 3 point pinch: Right: 13 lbs, Left: 9 lbs                        COORDINATION:   9 Hole Peg test: Right: 27 sec.  sec; Left: 52 sec.  10/09/2022: 9 Hole Peg test: Right: 27 sec.  sec; Left: 1 min. & 35 sec.  01/13/2023: 9 Hole Peg test: Right: 27 sec.  sec; Left: 51. Sec.  03/10/2023: 9 Hole Peg test: Right: 27 sec.  sec; Left: 54 Sec.  04/21/2023: 9 Hole Peg test: Right: 27 sec.  sec; Left: 46 Sec.  06/02/23: 9 hole Peg test: Left: 47 sec  06/15/23: 9 hole Peg test: Left: 45 sec  07/28/2023: 9 hole Peg test: Left: 44 sec  08/25/2023: 9 hole Peg test: Left: 39 sec  09/22/23: 9 hole peg test: Left: 38 sec,     SENSATION: Light touch: WFL Proprioception: WFL   COGNITION: Overall cognitive status: Within functional limits for tasks assessed   VISION: Subjective report: Glasses. Just received a new prescription to increase bifocal strength Baseline vision: Macular Degeneration Visual history: macular degeneration   VISION ASSESSMENT: To be further assessed in functional context  Left sided Awareness     PERCEPTION: Limited left sided awareness   TODAY'S TREATMENT:   There. Ex.:   -UE strengthening  using the SciFit for 10 min. with constant monitoring of the BUE on level 4.0   Therapeutic Activities:  -Left Lateral, and 3pt. Pinch strengthening using yellow, red, green level resistive clips  -Facilitated translatory movements moving clips from the lateral pinch position to the 3pt. Pinch position in preparation for securely placing them on the dowel.   Neuromuscular  re-education:  -Pt. worked on using her left hand grasping small cubes with resistive tweezers, removing them from a cylinder, and stacking them.        PATIENT EDUCATION: Education details: UE strengthening, FMC, functional reach Person educated: Patient Education method: verbal cues Education  comprehension: verbalized understanding    HOME EXERCISE PROGRAM:  Reviewed home activities to enhance left hand digit extension during typing skills.   GOALS: Goals reviewed with patient? Yes     SHORT TERM GOALS: Target date: 07/21/23   Pt. Will improve FOTO score by 2 points to reflect improved pt. perceived functional performance Baseline: 07/28/2023: FOTO score: 62, 06/16/2023: FOTO score: 70 06/02/23: 65; 04/21/2023: 49 03/10/2023: 59 01/13/2023: 49 12/01/2022: FOTO: 51 10/23/2022: FOTO 49 10/09/2022: FOTO 49, FOTO: 61 TR score: 62 Goal status:  Goal discharged   LONG TERM GOALS: Target date: 11/17/2023   Pt. Will improve left shoulder ROM by 10 degrees to be able able to efficiently apply deodorant Baseline: 09/22/23: shoulder flexion 87(108), abd 86(110)  With increased time to complete 08/25/23: shoulder flexion 85(108), abd 84(110)  07/28/2023: shoulder flexion 83(108), abd 80(110)  06/16/23: shoulder flexion 80 (108), abd 76(106) Pt. Is able to apply deodorant to the left, however takes increased time.; 06/02/23: shoulder flexion 77 (104), abd 70 (100); 04/21/2023: shoulder flexion: 40(87), Abduction: 48(76) 03/10/2023: Shoulder flexion: 35(85) 01/13/2023:  Shoulder flexion: 35(82), Abduction: 47(70) 12/02/2022: Shoulder flexion: 32(82), Abduction: 45(60)  10/23/2022: Shoulder flexion: 26(80), Abduction: 32(50) 10/09/2022: flexion: 0(55), Abduction: 32(54) Goal status: Improving, ongoing   2.  Pt. will be able to independently hold and use a blow dryer, and brush for hair care. Baseline: 09/22/23: Pt. Is able to reach up higher to blow dry her hair, however has difficulty  with combinations of  distal movements while sustaining the UE in elevation.  08/25/23: Pt. Continues to be able to hold a blow dryer, and is able to move the blow dryer within her hand more efficiently, however reaches only to L side of head and lower part of back of head with brush and blow dryer d/t limited ER ROM and strength 07/28/2023: Pt can hold a blow dryer, and is able to move the blow dryer within her hand, however reaches only to L side of head and lower part of back of head with brush and blow dryer d/t limited ER ROM and strength11/26/2024: shoulder flexion 80 (108), abd 76(106) 06/02/23: Pt can hold a blow dryer but reaches only to L side of head and lower part of back of head with brush and blow dryer d/t limited ER ROM and strength; 04/21/2023: Pt. is able to hold the hair dryer in the left hand, however is unable to reach up to dry it, and has difficulty using a brush with the left UE. 03/10/2023: Pt. continues to be able to hold the hair dryer in the left hand, however is unable to reach up to dry it, and has difficulty using a brush 01/13/2023: Pt. Can hold the hair dryer in the left hand, however is unable to reach up to dry it, and has difficulty using a brush. 12/02/2022: Pt. is unable to actively elevate he left shoulder in order to blow dry her hair. 10/23/2022: Pt. continues to be unable to hold a blow dryer. Eval: Pt. Is unable to sustain her BUEs in elevation, and use a blow dryer, and brush. 10/09/2022: Pt. Is unable to hold a blow dryer. Eval: Pt. Is unable to sustain her BUEs in elevation, and use a blow dryer, and brush. Goal status: improving/ongoing   3.  Pt. Will improve left lateral pinch strength by 3# to be able to independently cut meat. Baseline: 3/0/4/25: Left Lateral Pinch  7# 08/25/2023: Lateral Pinch strength: 7# 07/28/2023:  Lateral pinch: Left: 7 lbs.06/16/2023:  Lateral pinch: Left: 5 lbs. Pt. Has difficulty cutting steak, however is able to cut tender pieces of chicken. 06/02/23: L lateral pinch:  6# (pt reports she lacks the strength to cut steak but can cut tender chicken); 04/21/2023: NT(Pinch Meter sent out for calibration) Pt. continues to have difficulty cutting meat. 03/10/2023: Left lateral pinch strength: 9#   01/13/2023: Left lateral pinch strength: 9#  12/02/2022: Left pinch: 7# Pt. has difficulty cutting meat. 10/23/2022: Pt. Continues to present with difficulty cutting meat. 10/09/2022: 1#  Eval: 2#. Pt. has difficulty stabilizing utensil, and food while cutting food. Goal status: Discontinued   4.  Pt. Will improve Left hand [facility] skills to be able to be able to independently, and efficiently manipulate small objects during ADL tasks. Baseline: 09/22/23: 38 sec.08/25/2023: 39 sec. 1/0/2025: Left FMC skills: 44 sec. 06/16/2023: Left 45 sec. 06/02/2023: L 47 sec; 04/21/2023:  46 sec. 03/10/2023: 54 sec. 01/13/2023: 51 sec. 12/02/2022: TBD  10/23/2022: Pt. Continues to present with difficulty manipulating small objects. 10/09/2022: Left: 1 min & 35 sec.: Eval: Left: 52 sec. Right: 27 sec. Goal status: Improving, Ongoing    6.: Pt. will improve with left grip strength to be able to hold and pull up pants Baseline: 09/22/23: Left grip strength 20# 08/25/23: Left  grip strength 20# independent hiking pants. 07/28/2023: Left grip strength: 20# 06/16/2023: Left grip strength: 19# Pt. Is now able to pull her pants up.06/02/23: L grip 19#, improving; 04/21/2023: Pt. is able to pull up covers, however has difficulty pulling up pants. 03/10/2023: left  grip strength: 15# 01/13/2023: left  grip strength: 15# 12/02/2022: 17# 10/23/2022: Left: 16# 10/09/2022: Left: 10# Eval:  Left grip strength 11#. Pt. has difficulty holding a full drink  securely with the left hand.             Goal status: Partially met. Discontinue   7. Pt. will improve typing speed, and accuracy in preparation for efficiently typing simple email message. Baseline: 09/22/23: Pt. Is now using the laptop more. 08/25/2023: TBD 07/28/2023: Pt. now has a  working laptop available for her to use. 06/16/2023:  Limited ability to carry-over practice at home due to Pt.'s mouse at home is not working. Pt. Also can not see the keys on her computer at home. 06/02/23: same as 04/21/23; 04/21/2023:  Pt. continues to present with limited speed, and accuracy with typing  03/10/2023: Pt. continues to present with limited speed, and accuracy with typing 01/13/2023: TBD 12/02/2022:  Pt. continues to present with limited speed, and accuracy with typing.10/23/2022: Pt. continues to present with limited speed, and accuracy with typing. 10/09/2022: Pt. presents with difficulty typing. Pt. presents with difficulty typing an email. Typing speed, and accuracy TBD             Goal status: Ongoing                                CLINICAL IMPRESSION:  Pt. tolerated the UE exercises with no reports of pain. Pt. presented with difficulty performing translatory movements moving the clips form one pinch position to the next. Pt. Was able to efficiently use the tweezers to grasp the small cubes, however had difficulty stacking them at the tabletop. Pt. continues to benefit from OT services to work towards improving Left UE functioning in order to maximize engagement in, and overall independence with ADLs, and IADL tasks, while reducing risk of adhesive capsulitis.  PERFORMANCE DEFICITS in functional skills including ADLs, IADLs, coordination, dexterity, sensation, ROM, strength, FMC, decreased knowledge of use of DME, vision, and UE functional use, cognitive skills including attention, and psychosocial skills including coping strategies, environmental adaptation, habits, and routines and behaviors.    IMPAIRMENTS are limiting patient from ADLs, IADLs, and social participation.    COMORBIDITIES may have co-morbidities  that affects occupational performance. Patient will benefit from skilled OT to address above impairments and improve overall function.   MODIFICATION OR ASSISTANCE TO  COMPLETE EVALUATION: Min-Moderate modification of tasks or assist with assess necessary to complete an evaluation.   OT OCCUPATIONAL PROFILE AND HISTORY: Detailed assessment: Review of records and additional review of physical, cognitive, psychosocial history related to current functional performance.   CLINICAL DECISION MAKING: Moderate - several treatment options, min-mod task modification necessary   REHAB POTENTIAL: Good   EVALUATION COMPLEXITY: Moderate      PLAN: OT FREQUENCY: 1x/week   OT DURATION: 12 weeks   PLANNED INTERVENTIONS: self care/ADL training, therapeutic exercise, therapeutic activity, neuromuscular re-education, manual therapy, passive range of motion, and paraffin   RECOMMENDED OTHER SERVICES:    CONSULTED AND AGREED WITH PLAN OF CARE: Patient   PLAN FOR NEXT SESSION:  Left hand strengthening and coordination exercises  Olegario Messier, MS, OTR/L    10/06/23, 1:50 PM

## 2023-10-07 ENCOUNTER — Encounter: Payer: Self-pay | Admitting: Internal Medicine

## 2023-10-08 ENCOUNTER — Encounter: Payer: Medicare HMO | Admitting: Occupational Therapy

## 2023-10-13 ENCOUNTER — Ambulatory Visit: Payer: Medicare HMO | Admitting: Occupational Therapy

## 2023-10-15 ENCOUNTER — Encounter: Payer: Medicare HMO | Admitting: Occupational Therapy

## 2023-10-20 ENCOUNTER — Ambulatory Visit: Payer: Medicare HMO | Admitting: Occupational Therapy

## 2023-10-21 ENCOUNTER — Ambulatory Visit: Attending: Adult Health | Admitting: Occupational Therapy

## 2023-10-21 DIAGNOSIS — M6281 Muscle weakness (generalized): Secondary | ICD-10-CM | POA: Insufficient documentation

## 2023-10-21 DIAGNOSIS — R278 Other lack of coordination: Secondary | ICD-10-CM | POA: Insufficient documentation

## 2023-10-21 NOTE — Therapy (Signed)
 Occupational Therapy Neuro Treatment Note    Patient Name: Alyssa Mayo MRN: [id number] DOB:10/07/1945, 78 y.o., female Today's Date: 03/14/2022   PCP: Alyssa Mina, MD REFERRING PROVIDER: [name], NP     OT End of Session - 10/21/23 1116     Visit Number 63    Number of Visits 81    Date for OT Re-Evaluation 11/17/23    Authorization Type Progress reporting period starting 09/22/23    OT Start Time 1104    OT Stop Time 1145    OT Time Calculation (min) 41 min    Activity Tolerance Patient tolerated treatment well    Behavior During Therapy [facility] for tasks assessed/performed                          Past Medical History:  Diagnosis Date   Abnormal levels of other serum enzymes 07/17/2015   Arthritis     Constipation 07/11/2015   COVID-19 03/06/2021   Diabetes mellitus without complication (HCC)     Elevated liver enzymes     Fatty liver     Generalized abdominal pain 07/11/2015   Hyperlipidemia     Hypertension     Lifelong obesity     Macular degeneration     Morbid obesity due to excess calories (HCC) 07/11/2015   Other fatigue 07/11/2015   Sleep apnea     Spinal headache           Past Surgical History:  Procedure Laterality Date   ABDOMINAL HYSTERECTOMY       APPENDECTOMY       CATARACT EXTRACTION       CERVICAL LAMINECTOMY   07/21/1985   CESAREAN SECTION       COLONOSCOPY   07/21/2012    diverticulosis, [facility] Dr. Ricki Mayo    COLONOSCOPY WITH PROPOFOL N/A 02/04/2019    Procedure: COLONOSCOPY WITH PROPOFOL;  Surgeon: Alyssa Deem, MD;  Location: [facility] ENDOSCOPY;  Service: Endoscopy;  Laterality: N/A;   COLONOSCOPY WITH PROPOFOL N/A 03/18/2021    Procedure: COLONOSCOPY WITH PROPOFOL;  Surgeon: Alyssa Bill, MD;  Location: [facility] ENDOSCOPY;  Service: Endoscopy;  Laterality: N/A;  DM   DIAGNOSTIC LAPAROSCOPY       ESOPHAGOGASTRODUODENOSCOPY (EGD) WITH PROPOFOL N/A 02/04/2019    Procedure: ESOPHAGOGASTRODUODENOSCOPY (EGD) WITH  PROPOFOL;  Surgeon: Alyssa Deem, MD;  Location: [facility] ENDOSCOPY;  Service: Endoscopy;  Laterality: N/A;   ESOPHAGOGASTRODUODENOSCOPY (EGD) WITH PROPOFOL N/A 03/18/2021    Procedure: ESOPHAGOGASTRODUODENOSCOPY (EGD) WITH PROPOFOL;  Surgeon: Alyssa Bill, MD;  Location: [facility] ENDOSCOPY;  Service: Endoscopy;  Laterality: N/A;   LOOP RECORDER INSERTION N/A 08/05/2021    Procedure: LOOP RECORDER INSERTION;  Surgeon: Alyssa Maw, MD;  Location: [facility] INVASIVE CV LAB;  Service: Cardiovascular;  Laterality: N/A;   SPINE SURGERY       TUBAL LIGATION            Patient Active Problem List    Diagnosis Date Noted   Paroxysmal atrial fibrillation (HCC) 08/08/2022   Hypercoagulable state due to paroxysmal atrial fibrillation (HCC) 08/08/2022   Cerebral edema (HCC) 08/07/2021   OSA (obstructive sleep apnea) 08/07/2021   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   CVA (cerebral vascular accident) (HCC) 08/01/2021   GERD (gastroesophageal reflux disease) 07/20/2019   Advanced care planning/counseling discussion 12/17/2016   Skin lesions, generalized 11/13/2016   Chronic right hip pain 06/17/2016   Vitamin D deficiency 09/26/2015   Diastasis recti  08/08/2015   Elevated serum GGT level 07/24/2015   Essential hypertension 07/17/2015   Fatty liver 07/17/2015   Elevated alkaline phosphatase level 07/17/2015   Elevated serum glutamic pyruvic transaminase (SGPT) level 07/17/2015   Abnormal finding on EKG 07/11/2015   Morbid obesity (HCC) 07/11/2015   Colon, diverticulosis 07/11/2015   Abdominal wall hernia 07/11/2015   DM (diabetes mellitus), type 2 with neurological complications (HCC)     Hyperlipidemia      ONSET DATE: 08/01/2021   REFERRING DIAG: CVA   THERAPY DIAG:  Muscle weakness (generalized)   Rationale for Evaluation and Treatment Rehabilitation   SUBJECTIVE:   SUBJECTIVE STATEMENT:       Pt. reports that she cooked over the weekend for a church function, and  reports that she was so tired that she slept until 1p.m. this past Monday.  Pt accompanied by: self   PERTINENT HISTORY:  Pt. is a 78 y.o. female who had a unexpectedly hospitalized from 2/12-2/14/2024 for COVID-19, and a UTI.  Pt. 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 Pt. attended inpatient rehab from 08/07/2021-08/22/2021. Pt. received Home health therapy services this past spring.  Pt. has recently had an assessment through driver rehabilitation services at [facility] with recommendations for referrals for outpatient OT/PT, and ST services. Pt. PMHx includes: HTN, Hyperlipidemia, Macular degeneration, DM, and Obesity.   PRECAUTIONS: None   WEIGHT BEARING RESTRICTIONS No   PAIN: Are you having pain?  No pain   FALLS: Has patient fallen in last 6 months? Yes.   LIVING ENVIRONMENT: Lives with: lives with their family  Son  [name] Lives in: House/apartment Main living are on one floor Stairs:  2 steps to enter Has following equipment at home: Single point cane, Wheelchair (manual), shower chair, Grab bars, bed rail, and rubber mat.   PLOF: Independent   PATIENT GOALS: To be able to Drive, and cook again   OBJECTIVE:    HAND DOMINANCE: Right    ADLs:  Transfers/ambulation related to ADLs: Independent Eating: Independent Grooming: [employer] Pt. reports being unable to use her left hand to perform haircare.  UB Dressing: Independent pullover shirt, independent now with fastening a bra LB Dressing: Independent donning pants socks, and slide on shoes Toileting: Independent Bathing: Independent Tub Shower transfers: Walk-in shower Independent now    IADLs: Shopping: Needs to be accompanied to the grocery store. Light housekeeping: Do  Meal Prep:  Is able to perform light meal prep Community mobility: Relies on son, and friend Medication management: Son sets up Mining engineer management:TBD Handwriting: TBD   MOBILITY  STATUS: Hx of falls out of bed     ACTIVITY TOLERANCE: Activity tolerance:  10-20 min. Before rest break   FUNCTIONAL OUTCOME MEASURES: FOTO: 61  TR score: 62  10/09/2022: FOTO: 49  (Recert) 06/02/23: 65  07/28/2023:  FOTO score: 62   UPPER EXTREMITY ROM      Active ROM Right eval Left eval Left  10/09/2022 Left 10/23/22 Left 12/02/2022 Left 01/13/2023 Left  03/10/2023 Left 04/21/23 Left 05/12/23 Left 06/02/23 Left 06/16/23 Left 07/28/23 Left 08/25/23 Left 09/22/23  Shoulder flexion WFL 54(74) scaption 0(55) 26(80) 32(82) 35(82) 35(85) 40(87) 70(96) 77 (104) 80(108) 83(108) 85(108) 87(108)  Shoulder abduction WFL 58(75) 32(54) 32(50) 45(60) 47(70) 62(72) 48(76) 73(102) 70 (100) 76(106) 80(110) 84(110) 86(110)  Shoulder adduction                  Shoulder extension  Shoulder internal rotation            Thumb to L3 (T10) Thumb to L3 (T10) Thumb L3 (T10)  Thumb to L3(T10)  Shoulder external rotation            (With elbow at side) 45 (60) (With elbow at side) 47 (60) (With elbow at side) 47 With elbow at side) 48 With elbow at side 48  Elbow flexion [facility] WFL 145 145 145 145 145 147  WFL [facility] WFL WFL  Elbow extension [facility] WFL 10(0) 10(0) WFL WFL [facility]  [facility] WFL  Wrist flexion WFL TBD [facility] WFL [facility]  [facility] WFL WFL  Wrist extension WFL TBD [facility]  [facility] WFL WFL  Wrist ulnar deviation                  Wrist radial deviation                  Wrist pronation             [facility] WFL WFL  Wrist supination             [facility] WFL WFL  (Blank rows = not tested)     UPPER EXTREMITY MMT:      MMT Right eval Left eval Left 10/09/2022 Left 10/23/2022 Left 12/02/2022 Left 01/13/2023 Left 03/10/2023 Left 04/21/23 Left 06/02/23 Left 06/16/23 Left 07/28/23 Left 08/25/23 Left 09/22/23  Shoulder flexion 4+/5 2+/5 1/5 2-/5 2-/5 2-/5 2-/5 2-/5 2- 2+/5 3-/5 3-/5 3-/5  Shoulder abduction 4+/5 2+/5 2-/5 2-/5 2-/5 2-/5 2-/5 2-/5  2- 2+/5 3-/5 3-/5 3-/5  Shoulder adduction                 Shoulder extension                 Shoulder internal rotation           4- 4-/5  4/5 4+/5  Shoulder external rotation           3- 3-/5  3-/5 3-/5  Middle trapezius                 Lower trapezius                 Elbow flexion 4+/5 3+/5 3/5 3/5 3+/5 3+/5 4/5 4/5 4+/5 4+/5 4+/5 5/5 5/5  Elbow extension 4+/5 3+/5 3/5 3/5 3+/5 3+/5 4/5 4/5 4/5 4+/5 4+/5 4+/5 5/5  Wrist flexion 4+/5 TBD 3/5 3/5 3+/5 3+/5 4/5 4/5 4+/5 4+/5 4+/5 4+/5 5/5  Wrist extension 4+/5 TBD 3/5 3/5 3+/5 3+/5 4/5 4/5 4/5 4/5 4+/5 4+/5 5/5  Wrist ulnar deviation                 Wrist radial deviation                 Wrist pronation                 Wrist supination                 (Blank rows = not tested)   HAND FUNCTION: Grip strength: Right: 33 lbs; Left: 11 lbs, Lateral pinch: Right: 17 lbs, Left: 2 lbs, and 3 point pinch: Right: 17 lbs, Left: 2 lbs  10/09/2022: Grip strength: Right: 33 lbs; Left: 10 lbs, Lateral pinch: Right: 17 lbs, Left: 1 lbs, and 3 point  pinch: Right: 17 lbs, Left: 1 lbs   10/23/2022: Grip strength: Right: 33 lbs; Left: 16 lbs, Lateral pinch: Right: 17 lbs, Left: 1 lbs, and 3 point pinch: Right: 17 lbs, Left: 1 lbs  12/02/2022: Grip strength: Right: 33 lbs; Left: 17 lbs, Lateral pinch: Right: 17 lbs, Left: 7 lbs, and 3 point pinch: Right: 17 lbs, Left: 3 lbs   01/13/2023: Grip strength: Right: 33 lbs; Left: 15 lbs, Lateral pinch: Right: 17 lbs, Left: 9 lbs, and 3 point pinch: Right: 17 lbs, Left: 7 lbs  03/10/2023: Grip strength: Right: 33 lbs; Left: 15 lbs, Lateral pinch: Right: 17 lbs, Left: 9 lbs, and 3 point pinch: Right: 17 lbs, Left: 7 lbs   04/21/2023: Grip strength: Right: 34 lbs; Left: 13 lbs, Lateral pinch: NT 3 point pinch: Right: NT  06/02/23: Grip strength: Right: 34 lbs; Left: 19 lbs, Lateral pinch: Right: 16 lbs; Left: 6 lbs; 3 point pinch: Right: 13 lbs, Left: 7 lbs   06/16/2023: Grip strength: Right: 39 lbs; Left: 19 lbs,  Lateral pinch: Right: 16 lbs; Left: 5 lbs; 3 point pinch: Right: 13 lbs, Left: 9 lbs   07/28/2023: Grip strength: Right: 39 lbs; Left: 20 lbs, Lateral pinch: Right: 16 lbs; Left: 7 lbs; 3 point pinch: Right: 13 lbs, Left: 9 lbs    08/25/2023: Grip strength: Right: 39 lbs; Left: 20 lbs, Lateral pinch: Right: 16 lbs; Left: 7 lbs; 3 point pinch: Right: 13 lbs, Left: 9 lbs   09/22/2023: Grip strength: Right: 39 lbs; Left: 20 lbs, Lateral pinch: Right: 16 lbs; Left: 7 lbs; 3 point pinch: Right: 13 lbs, Left: 9 lbs                        COORDINATION:   9 Hole Peg test: Right: 27 sec.  sec; Left: 52 sec.  10/09/2022: 9 Hole Peg test: Right: 27 sec.  sec; Left: 1 min. & 35 sec.  01/13/2023: 9 Hole Peg test: Right: 27 sec.  sec; Left: 51. Sec.  03/10/2023: 9 Hole Peg test: Right: 27 sec.  sec; Left: 54 Sec.  04/21/2023: 9 Hole Peg test: Right: 27 sec.  sec; Left: 46 Sec.  06/02/23: 9 hole Peg test: Left: 47 sec  06/15/23: 9 hole Peg test: Left: 45 sec  07/28/2023: 9 hole Peg test: Left: 44 sec  08/25/2023: 9 hole Peg test: Left: 39 sec  09/22/23: 9 hole peg test: Left: 38 sec,     SENSATION: Light touch: WFL Proprioception: WFL   COGNITION: Overall cognitive status: Within functional limits for tasks assessed   VISION: Subjective report: Glasses. Just received a new prescription to increase bifocal strength Baseline vision: Macular Degeneration Visual history: macular degeneration   VISION ASSESSMENT: To be further assessed in functional context  Left sided Awareness     PERCEPTION: Limited left sided awareness   TODAY'S TREATMENT:   There. Ex.:   -UE strengthening  using the SciFit for 10 min. with constant monitoring of the BUE on level 4.0  - AROM with PROM to the end range for left  shoulder flexion, and abduction.  Therapeutic Activities:  -Left Lateral, and 3pt. Pinch strengthening using yellow, red, green level resistive clips  -Facilitated translatory  movements moving clips from the lateral pinch position to the 3pt. Pinch position in preparation for securely placing them on the dowel.   Neuromuscular re-education:  --facilitated left  hand FMC tasks using the Grooved pegboard, grasping and 1" grooved pegs from  a horizontal position in the shallow dish, and transitioning them to a vertical position in preparation for placing them upright into the pegboard.         PATIENT EDUCATION: Education details: UE strengthening, FMC, functional reach Person educated: Patient Education method: verbal cues Education comprehension: verbalized understanding    HOME EXERCISE PROGRAM:  Reviewed home activities to enhance left hand digit extension during typing skills.   GOALS: Goals reviewed with patient? Yes     SHORT TERM GOALS: Target date: 07/21/23   Pt. Will improve FOTO score by 2 points to reflect improved pt. perceived functional performance Baseline: 07/28/2023: FOTO score: 62, 06/16/2023: FOTO score: 70 06/02/23: 65; 04/21/2023: 49 03/10/2023: 59 01/13/2023: 49 12/01/2022: FOTO: 51 10/23/2022: FOTO 49 10/09/2022: FOTO 49, FOTO: 61 TR score: 62 Goal status:  Goal discharged   LONG TERM GOALS: Target date: 11/17/2023   Pt. Will improve left shoulder ROM by 10 degrees to be able able to efficiently apply deodorant Baseline: 09/22/23: shoulder flexion 87(108), abd 86(110)  With increased time to complete 08/25/23: shoulder flexion 85(108), abd 84(110)  07/28/2023: shoulder flexion 83(108), abd 80(110)  06/16/23: shoulder flexion 80 (108), abd 76(106) Pt. Is able to apply deodorant to the left, however takes increased time.; 06/02/23: shoulder flexion 77 (104), abd 70 (100); 04/21/2023: shoulder flexion: 40(87), Abduction: 48(76) 03/10/2023: Shoulder flexion: 35(85) 01/13/2023:  Shoulder flexion: 35(82), Abduction: 47(70) 12/02/2022: Shoulder flexion: 32(82), Abduction: 45(60)  10/23/2022: Shoulder flexion: 26(80), Abduction: 32(50) 10/09/2022: flexion:  0(55), Abduction: 32(54) Goal status: Improving, ongoing   2.  Pt. will be able to independently hold and use a blow dryer, and brush for hair care. Baseline: 09/22/23: Pt. Is able to reach up higher to blow dry her hair, however has difficulty  with combinations of distal movements while sustaining the UE in elevation.  08/25/23: Pt. Continues to be able to hold a blow dryer, and is able to move the blow dryer within her hand more efficiently, however reaches only to L side of head and lower part of back of head with brush and blow dryer d/t limited ER ROM and strength 07/28/2023: Pt can hold a blow dryer, and is able to move the blow dryer within her hand, however reaches only to L side of head and lower part of back of head with brush and blow dryer d/t limited ER ROM and strength11/26/2024: shoulder flexion 80 (108), abd 76(106) 06/02/23: Pt can hold a blow dryer but reaches only to L side of head and lower part of back of head with brush and blow dryer d/t limited ER ROM and strength; 04/21/2023: Pt. is able to hold the hair dryer in the left hand, however is unable to reach up to dry it, and has difficulty using a brush with the left UE. 03/10/2023: Pt. continues to be able to hold the hair dryer in the left hand, however is unable to reach up to dry it, and has difficulty using a brush 01/13/2023: Pt. Can hold the hair dryer in the left hand, however is unable to reach up to dry it, and has difficulty using a brush. 12/02/2022: Pt. is unable to actively elevate he left shoulder in order to blow dry her hair. 10/23/2022: Pt. continues to be unable to hold a blow dryer. Eval: Pt. Is unable to sustain her BUEs in elevation, and use a blow dryer, and brush. 10/09/2022: Pt. Is unable to hold a blow dryer. Eval: Pt. Is unable to sustain her BUEs in elevation, and  use a blow dryer, and brush. Goal status: improving/ongoing   3.  Pt. Will improve left lateral pinch strength by 3# to be able to independently cut  meat. Baseline: 3/0/4/25: Left Lateral Pinch  7# 08/25/2023: Lateral Pinch strength: 7# 07/28/2023:  Lateral pinch: Left: 7 lbs.06/16/2023: Lateral pinch: Left: 5 lbs. Pt. Has difficulty cutting steak, however is able to cut tender pieces of chicken. 06/02/23: L lateral pinch: 6# (pt reports she lacks the strength to cut steak but can cut tender chicken); 04/21/2023: NT(Pinch Meter sent out for calibration) Pt. continues to have difficulty cutting meat. 03/10/2023: Left lateral pinch strength: 9#   01/13/2023: Left lateral pinch strength: 9#  12/02/2022: Left pinch: 7# Pt. has difficulty cutting meat. 10/23/2022: Pt. Continues to present with difficulty cutting meat. 10/09/2022: 1#  Eval: 2#. Pt. has difficulty stabilizing utensil, and food while cutting food. Goal status: Discontinued   4.  Pt. Will improve Left hand [facility] skills to be able to be able to independently, and efficiently manipulate small objects during ADL tasks. Baseline: 09/22/23: 38 sec.08/25/2023: 39 sec. 1/0/2025: Left FMC skills: 44 sec. 06/16/2023: Left 45 sec. 06/02/2023: L 47 sec; 04/21/2023:  46 sec. 03/10/2023: 54 sec. 01/13/2023: 51 sec. 12/02/2022: TBD  10/23/2022: Pt. Continues to present with difficulty manipulating small objects. 10/09/2022: Left: 1 min & 35 sec.: Eval: Left: 52 sec. Right: 27 sec. Goal status: Improving, Ongoing    6.: Pt. will improve with left grip strength to be able to hold and pull up pants Baseline: 09/22/23: Left grip strength 20# 08/25/23: Left  grip strength 20# independent hiking pants. 07/28/2023: Left grip strength: 20# 06/16/2023: Left grip strength: 19# Pt. Is now able to pull her pants up.06/02/23: L grip 19#, improving; 04/21/2023: Pt. is able to pull up covers, however has difficulty pulling up pants. 03/10/2023: left  grip strength: 15# 01/13/2023: left  grip strength: 15# 12/02/2022: 17# 10/23/2022: Left: 16# 10/09/2022: Left: 10# Eval:  Left grip strength 11#. Pt. has difficulty holding a full drink  securely  with the left hand.             Goal status: Partially met. Discontinue   7. Pt. will improve typing speed, and accuracy in preparation for efficiently typing simple email message. Baseline: 09/22/23: Pt. Is now using the laptop more. 08/25/2023: TBD 07/28/2023: Pt. now has a working laptop available for her to use. 06/16/2023:  Limited ability to carry-over practice at home due to Pt.'s mouse at home is not working. Pt. Also can not see the keys on her computer at home. 06/02/23: same as 04/21/23; 04/21/2023:  Pt. continues to present with limited speed, and accuracy with typing  03/10/2023: Pt. continues to present with limited speed, and accuracy with typing 01/13/2023: TBD 12/02/2022:  Pt. continues to present with limited speed, and accuracy with typing.10/23/2022: Pt. continues to present with limited speed, and accuracy with typing. 10/09/2022: Pt. presents with difficulty typing. Pt. presents with difficulty typing an email. Typing speed, and accuracy TBD             Goal status: Ongoing                                CLINICAL IMPRESSION:  Pt. tolerated the LUE exercises with no reports of pain. Pt. presents with improving  translatory movements moving the clips form one pinch position to the next. Pt. Presents with difficulty performing tanslatory skills moving the grooved  pegs from her palm to the tip of the 2nd digit, and thumb in preparation for placing them into the grooved pegboard. Pt. continues to benefit from OT services to work towards improving Left UE functioning in order to maximize engagement in, and overall independence with ADLs, and IADL tasks, while reducing risk of adhesive capsulitis.     PERFORMANCE DEFICITS in functional skills including ADLs, IADLs, coordination, dexterity, sensation, ROM, strength, FMC, decreased knowledge of use of DME, vision, and UE functional use, cognitive skills including attention, and psychosocial skills including coping strategies, environmental  adaptation, habits, and routines and behaviors.    IMPAIRMENTS are limiting patient from ADLs, IADLs, and social participation.    COMORBIDITIES may have co-morbidities  that affects occupational performance. Patient will benefit from skilled OT to address above impairments and improve overall function.   MODIFICATION OR ASSISTANCE TO COMPLETE EVALUATION: Min-Moderate modification of tasks or assist with assess necessary to complete an evaluation.   OT OCCUPATIONAL PROFILE AND HISTORY: Detailed assessment: Review of records and additional review of physical, cognitive, psychosocial history related to current functional performance.   CLINICAL DECISION MAKING: Moderate - several treatment options, min-mod task modification necessary   REHAB POTENTIAL: Good   EVALUATION COMPLEXITY: Moderate      PLAN: OT FREQUENCY: 1x/week   OT DURATION: 12 weeks   PLANNED INTERVENTIONS: self care/ADL training, therapeutic exercise, therapeutic activity, neuromuscular re-education, manual therapy, passive range of motion, and paraffin   RECOMMENDED OTHER SERVICES:    CONSULTED AND AGREED WITH PLAN OF CARE: Patient   PLAN FOR NEXT SESSION:  Left hand strengthening and coordination exercises  Olegario Messier, MS, OTR/L    10/21/23, 11:19 AM

## 2023-10-22 ENCOUNTER — Encounter: Payer: Medicare HMO | Admitting: Occupational Therapy

## 2023-10-27 ENCOUNTER — Ambulatory Visit: Payer: Medicare HMO | Admitting: Occupational Therapy

## 2023-10-27 DIAGNOSIS — M6281 Muscle weakness (generalized): Secondary | ICD-10-CM

## 2023-10-27 DIAGNOSIS — R278 Other lack of coordination: Secondary | ICD-10-CM

## 2023-10-27 NOTE — Therapy (Signed)
 Occupational Therapy Neuro Treatment Note    Patient Name: Alyssa Mayo MRN: [id number] DOB:August 18, 1945, 78 y.o., female Today's Date: 03/14/2022   PCP: [name], MD REFERRING PROVIDER: [name], NP     OT End of Session - 10/27/23 1138     Visit Number 64    Number of Visits 81    Date for OT Re-Evaluation 11/17/23    OT Start Time 1105    OT Stop Time 1145    OT Time Calculation (min) 40 min    Activity Tolerance Patient tolerated treatment well    Behavior During Therapy [facility] for tasks assessed/performed                          Past Medical History:  Diagnosis Date   Abnormal levels of other serum enzymes 07/17/2015   Arthritis     Constipation 07/11/2015   COVID-19 03/06/2021   Diabetes mellitus without complication (HCC)     Elevated liver enzymes     Fatty liver     Generalized abdominal pain 07/11/2015   Hyperlipidemia     Hypertension     Lifelong obesity     Macular degeneration     Morbid obesity due to excess calories (HCC) 07/11/2015   Other fatigue 07/11/2015   Sleep apnea     Spinal headache           Past Surgical History:  Procedure Laterality Date   ABDOMINAL HYSTERECTOMY       APPENDECTOMY       CATARACT EXTRACTION       CERVICAL LAMINECTOMY   07/21/1985   CESAREAN SECTION       COLONOSCOPY   07/21/2012    diverticulosis, [facility] Dr. [name]    COLONOSCOPY WITH PROPOFOL N/A 02/04/2019    Procedure: COLONOSCOPY WITH PROPOFOL;  Surgeon: [name], MD;  Location: [facility] ENDOSCOPY;  Service: Endoscopy;  Laterality: N/A;   COLONOSCOPY WITH PROPOFOL N/A 03/18/2021    Procedure: COLONOSCOPY WITH PROPOFOL;  Surgeon: [name], MD;  Location: [facility] ENDOSCOPY;  Service: Endoscopy;  Laterality: N/A;  DM   DIAGNOSTIC LAPAROSCOPY       ESOPHAGOGASTRODUODENOSCOPY (EGD) WITH PROPOFOL N/A 02/04/2019    Procedure: ESOPHAGOGASTRODUODENOSCOPY (EGD) WITH PROPOFOL;  Surgeon: [name], MD;  Location: [facility] ENDOSCOPY;   Service: Endoscopy;  Laterality: N/A;   ESOPHAGOGASTRODUODENOSCOPY (EGD) WITH PROPOFOL N/A 03/18/2021    Procedure: ESOPHAGOGASTRODUODENOSCOPY (EGD) WITH PROPOFOL;  Surgeon: [name], MD;  Location: [facility] ENDOSCOPY;  Service: Endoscopy;  Laterality: N/A;   LOOP RECORDER INSERTION N/A 08/05/2021    Procedure: LOOP RECORDER INSERTION;  Surgeon: [name], MD;  Location: [facility] INVASIVE CV LAB;  Service: Cardiovascular;  Laterality: N/A;   SPINE SURGERY       TUBAL LIGATION            Patient Active Problem List    Diagnosis Date Noted   Paroxysmal atrial fibrillation (HCC) 08/08/2022   Hypercoagulable state due to paroxysmal atrial fibrillation (HCC) 08/08/2022   Cerebral edema (HCC) 08/07/2021   OSA (obstructive sleep apnea) 08/07/2021   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   CVA (cerebral vascular accident) (HCC) 08/01/2021   GERD (gastroesophageal reflux disease) 07/20/2019   Advanced care planning/counseling discussion 12/17/2016   Skin lesions, generalized 11/13/2016   Chronic right hip pain 06/17/2016   Vitamin D deficiency 09/26/2015   Diastasis recti 08/08/2015   Elevated serum GGT level 07/24/2015  Essential hypertension 07/17/2015   Fatty liver 07/17/2015   Elevated alkaline phosphatase level 07/17/2015   Elevated serum glutamic pyruvic transaminase (SGPT) level 07/17/2015   Abnormal finding on EKG 07/11/2015   Morbid obesity (HCC) 07/11/2015   Colon, diverticulosis 07/11/2015   Abdominal wall hernia 07/11/2015   DM (diabetes mellitus), type 2 with neurological complications (HCC)     Hyperlipidemia      ONSET DATE: 08/01/2021   REFERRING DIAG: CVA   THERAPY DIAG:  Muscle weakness (generalized)   Rationale for Evaluation and Treatment Rehabilitation   SUBJECTIVE:   SUBJECTIVE STATEMENT:       Pt. reports that  her birthday is this week, and she is looking forward to a visit from her brother.  Pt accompanied by: self   PERTINENT  HISTORY:  Pt. is a 78 y.o. female who had a unexpectedly hospitalized from 2/12-2/14/2024 for COVID-19, and a UTI.  Pt. 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 Pt. attended inpatient rehab from 08/07/2021-08/22/2021. Pt. received Home health therapy services this past spring.  Pt. has recently had an assessment through driver rehabilitation services at [facility] with recommendations for referrals for outpatient OT/PT, and ST services. Pt. PMHx includes: HTN, Hyperlipidemia, Macular degeneration, DM, and Obesity.   PRECAUTIONS: None   WEIGHT BEARING RESTRICTIONS No   PAIN: Are you having pain?  No pain   FALLS: Has patient fallen in last 6 months? Yes.   LIVING ENVIRONMENT: Lives with: lives with their family  Son  [name] Lives in: House/apartment Main living are on one floor Stairs:  2 steps to enter Has following equipment at home: Single point cane, Wheelchair (manual), shower chair, Grab bars, bed rail, and rubber mat.   PLOF: Independent   PATIENT GOALS: To be able to Drive, and cook again   OBJECTIVE:    HAND DOMINANCE: Right    ADLs:  Transfers/ambulation related to ADLs: Independent Eating: Independent Grooming: [employer] Pt. reports being unable to use her left hand to perform haircare.  UB Dressing: Independent pullover shirt, independent now with fastening a bra LB Dressing: Independent donning pants socks, and slide on shoes Toileting: Independent Bathing: Independent Tub Shower transfers: Walk-in shower Independent now    IADLs: Shopping: Needs to be accompanied to the grocery store. Light housekeeping: Do  Meal Prep:  Is able to perform light meal prep Community mobility: Relies on son, and friend Medication management: Son sets up Mining engineer management:TBD Handwriting: TBD   MOBILITY STATUS: Hx of falls out of bed     ACTIVITY TOLERANCE: Activity tolerance:  10-20 min. Before rest break    FUNCTIONAL OUTCOME MEASURES: FOTO: 61  TR score: 62  10/09/2022: FOTO: 49  (Recert) 06/02/23: 65  07/28/2023:  FOTO score: 62   UPPER EXTREMITY ROM      Active ROM Right eval Left eval Left  10/09/2022 Left 10/23/22 Left 12/02/2022 Left 01/13/2023 Left  03/10/2023 Left 04/21/23 Left 05/12/23 Left 06/02/23 Left 06/16/23 Left 07/28/23 Left 08/25/23 Left 09/22/23  Shoulder flexion WFL 54(74) scaption 0(55) 26(80) 32(82) 35(82) 35(85) 40(87) 70(96) 77 (104) 80(108) 83(108) 85(108) 87(108)  Shoulder abduction WFL 58(75) 32(54) 32(50) 45(60) 47(70) 62(72) 48(76) 73(102) 70 (100) 76(106) 80(110) 84(110) 86(110)  Shoulder adduction                  Shoulder extension                  Shoulder internal rotation  Thumb to L3 (T10) Thumb to L3 (T10) Thumb L3 (T10)  Thumb to L3(T10)  Shoulder external rotation            (With elbow at side) 45 (60) (With elbow at side) 47 (60) (With elbow at side) 47 With elbow at side) 48 With elbow at side 48  Elbow flexion [facility] WFL 145 145 145 145 145 147  WFL [facility] WFL WFL  Elbow extension [facility] WFL 10(0) 10(0) WFL WFL [facility]  [facility] WFL  Wrist flexion WFL TBD [facility] WFL [facility]  [facility] WFL WFL  Wrist extension WFL TBD [facility]  [facility] WFL WFL  Wrist ulnar deviation                  Wrist radial deviation                  Wrist pronation             [facility] WFL WFL  Wrist supination             [facility] WFL WFL  (Blank rows = not tested)     UPPER EXTREMITY MMT:      MMT Right eval Left eval Left 10/09/2022 Left 10/23/2022 Left 12/02/2022 Left 01/13/2023 Left 03/10/2023 Left 04/21/23 Left 06/02/23 Left 06/16/23 Left 07/28/23 Left 08/25/23 Left 09/22/23  Shoulder flexion 4+/5 2+/5 1/5 2-/5 2-/5 2-/5 2-/5 2-/5 2- 2+/5 3-/5 3-/5 3-/5  Shoulder abduction 4+/5 2+/5 2-/5 2-/5 2-/5 2-/5 2-/5 2-/5 2- 2+/5 3-/5 3-/5 3-/5  Shoulder adduction                 Shoulder extension                 Shoulder  internal rotation           4- 4-/5  4/5 4+/5  Shoulder external rotation           3- 3-/5  3-/5 3-/5  Middle trapezius                 Lower trapezius                 Elbow flexion 4+/5 3+/5 3/5 3/5 3+/5 3+/5 4/5 4/5 4+/5 4+/5 4+/5 5/5 5/5  Elbow extension 4+/5 3+/5 3/5 3/5 3+/5 3+/5 4/5 4/5 4/5 4+/5 4+/5 4+/5 5/5  Wrist flexion 4+/5 TBD 3/5 3/5 3+/5 3+/5 4/5 4/5 4+/5 4+/5 4+/5 4+/5 5/5  Wrist extension 4+/5 TBD 3/5 3/5 3+/5 3+/5 4/5 4/5 4/5 4/5 4+/5 4+/5 5/5  Wrist ulnar deviation                 Wrist radial deviation                 Wrist pronation                 Wrist supination                 (Blank rows = not tested)   HAND FUNCTION: Grip strength: Right: 33 lbs; Left: 11 lbs, Lateral pinch: Right: 17 lbs, Left: 2 lbs, and 3 point pinch: Right: 17 lbs, Left: 2 lbs  10/09/2022: Grip strength: Right: 33 lbs; Left: 10 lbs, Lateral pinch: Right: 17 lbs, Left: 1 lbs, and 3 point pinch: Right: 17 lbs, Left: 1 lbs   10/23/2022: Grip strength: Right: 33  lbs; Left: 16 lbs, Lateral pinch: Right: 17 lbs, Left: 1 lbs, and 3 point pinch: Right: 17 lbs, Left: 1 lbs  12/02/2022: Grip strength: Right: 33 lbs; Left: 17 lbs, Lateral pinch: Right: 17 lbs, Left: 7 lbs, and 3 point pinch: Right: 17 lbs, Left: 3 lbs   01/13/2023: Grip strength: Right: 33 lbs; Left: 15 lbs, Lateral pinch: Right: 17 lbs, Left: 9 lbs, and 3 point pinch: Right: 17 lbs, Left: 7 lbs  03/10/2023: Grip strength: Right: 33 lbs; Left: 15 lbs, Lateral pinch: Right: 17 lbs, Left: 9 lbs, and 3 point pinch: Right: 17 lbs, Left: 7 lbs   04/21/2023: Grip strength: Right: 34 lbs; Left: 13 lbs, Lateral pinch: NT 3 point pinch: Right: NT  06/02/23: Grip strength: Right: 34 lbs; Left: 19 lbs, Lateral pinch: Right: 16 lbs; Left: 6 lbs; 3 point pinch: Right: 13 lbs, Left: 7 lbs   06/16/2023: Grip strength: Right: 39 lbs; Left: 19 lbs, Lateral pinch: Right: 16 lbs; Left: 5 lbs; 3 point pinch: Right: 13 lbs, Left: 9 lbs   07/28/2023: Grip  strength: Right: 39 lbs; Left: 20 lbs, Lateral pinch: Right: 16 lbs; Left: 7 lbs; 3 point pinch: Right: 13 lbs, Left: 9 lbs    08/25/2023: Grip strength: Right: 39 lbs; Left: 20 lbs, Lateral pinch: Right: 16 lbs; Left: 7 lbs; 3 point pinch: Right: 13 lbs, Left: 9 lbs   09/22/2023: Grip strength: Right: 39 lbs; Left: 20 lbs, Lateral pinch: Right: 16 lbs; Left: 7 lbs; 3 point pinch: Right: 13 lbs, Left: 9 lbs                        COORDINATION:   9 Hole Peg test: Right: 27 sec.  sec; Left: 52 sec.  10/09/2022: 9 Hole Peg test: Right: 27 sec.  sec; Left: 1 min. & 35 sec.  01/13/2023: 9 Hole Peg test: Right: 27 sec.  sec; Left: 51. Sec.  03/10/2023: 9 Hole Peg test: Right: 27 sec.  sec; Left: 54 Sec.  04/21/2023: 9 Hole Peg test: Right: 27 sec.  sec; Left: 46 Sec.  06/02/23: 9 hole Peg test: Left: 47 sec  06/15/23: 9 hole Peg test: Left: 45 sec  07/28/2023: 9 hole Peg test: Left: 44 sec  08/25/2023: 9 hole Peg test: Left: 39 sec  09/22/23: 9 hole peg test: Left: 38 sec,     SENSATION: Light touch: WFL Proprioception: WFL   COGNITION: Overall cognitive status: Within functional limits for tasks assessed   VISION: Subjective report: Glasses. Just received a new prescription to increase bifocal strength Baseline vision: Macular Degeneration Visual history: macular degeneration   VISION ASSESSMENT: To be further assessed in functional context  Left sided Awareness     PERCEPTION: Limited left sided awareness   TODAY'S TREATMENT:   There. Ex.:   -UE strengthening  using the SciFit for 10 min. with constant monitoring of the BUE on level 4.0  - AROM with PROM to the end range for left  shoulder flexion, and abduction. -AAROM shoulder at the tabletop surface  There. Activities:  -Pt. worked on Arts development officer, and moving them through 2 progressively increasing heights of vertical dowels.    Neuromuscular re-education:   -Facilitated [facility] skills using the  Jamar Tweezer Dexterity Task using resistive tweezers to pick up 1" thin sticks from a horizontal position, and sustaining the grasp while preparing to place them into a vertical position with the pegboard placed at a flat  tabletop surface.       PATIENT EDUCATION: Education details: UE strengthening, FMC, functional reach Person educated: Patient Education method: verbal cues Education comprehension: verbalized understanding    HOME EXERCISE PROGRAM:  Reviewed home activities to enhance left hand digit extension during typing skills.   GOALS: Goals reviewed with patient? Yes     SHORT TERM GOALS: Target date: 07/21/23   Pt. Will improve FOTO score by 2 points to reflect improved pt. perceived functional performance Baseline: 07/28/2023: FOTO score: 62, 06/16/2023: FOTO score: 70 06/02/23: 65; 04/21/2023: 49 03/10/2023: 59 01/13/2023: 49 12/01/2022: FOTO: 51 10/23/2022: FOTO 49 10/09/2022: FOTO 49, FOTO: 61 TR score: 62 Goal status:  Goal discharged   LONG TERM GOALS: Target date: 11/17/2023   Pt. Will improve left shoulder ROM by 10 degrees to be able able to efficiently apply deodorant Baseline: 09/22/23: shoulder flexion 87(108), abd 86(110)  With increased time to complete 08/25/23: shoulder flexion 85(108), abd 84(110)  07/28/2023: shoulder flexion 83(108), abd 80(110)  06/16/23: shoulder flexion 80 (108), abd 76(106) Pt. Is able to apply deodorant to the left, however takes increased time.; 06/02/23: shoulder flexion 77 (104), abd 70 (100); 04/21/2023: shoulder flexion: 40(87), Abduction: 48(76) 03/10/2023: Shoulder flexion: 35(85) 01/13/2023:  Shoulder flexion: 35(82), Abduction: 47(70) 12/02/2022: Shoulder flexion: 32(82), Abduction: 45(60)  10/23/2022: Shoulder flexion: 26(80), Abduction: 32(50) 10/09/2022: flexion: 0(55), Abduction: 32(54) Goal status: Improving, ongoing   2.  Pt. will be able to independently hold and use a blow dryer, and brush for hair care. Baseline: 09/22/23: Pt. Is  able to reach up higher to blow dry her hair, however has difficulty  with combinations of distal movements while sustaining the UE in elevation.  08/25/23: Pt. Continues to be able to hold a blow dryer, and is able to move the blow dryer within her hand more efficiently, however reaches only to L side of head and lower part of back of head with brush and blow dryer d/t limited ER ROM and strength 07/28/2023: Pt can hold a blow dryer, and is able to move the blow dryer within her hand, however reaches only to L side of head and lower part of back of head with brush and blow dryer d/t limited ER ROM and strength11/26/2024: shoulder flexion 80 (108), abd 76(106) 06/02/23: Pt can hold a blow dryer but reaches only to L side of head and lower part of back of head with brush and blow dryer d/t limited ER ROM and strength; 04/21/2023: Pt. is able to hold the hair dryer in the left hand, however is unable to reach up to dry it, and has difficulty using a brush with the left UE. 03/10/2023: Pt. continues to be able to hold the hair dryer in the left hand, however is unable to reach up to dry it, and has difficulty using a brush 01/13/2023: Pt. Can hold the hair dryer in the left hand, however is unable to reach up to dry it, and has difficulty using a brush. 12/02/2022: Pt. is unable to actively elevate he left shoulder in order to blow dry her hair. 10/23/2022: Pt. continues to be unable to hold a blow dryer. Eval: Pt. Is unable to sustain her BUEs in elevation, and use a blow dryer, and brush. 10/09/2022: Pt. Is unable to hold a blow dryer. Eval: Pt. Is unable to sustain her BUEs in elevation, and use a blow dryer, and brush. Goal status: improving/ongoing   3.  Pt. Will improve left lateral pinch strength by 3# to  be able to independently cut meat. Baseline: 3/0/4/25: Left Lateral Pinch  7# 08/25/2023: Lateral Pinch strength: 7# 07/28/2023:  Lateral pinch: Left: 7 lbs.06/16/2023: Lateral pinch: Left: 5 lbs. Pt. Has difficulty  cutting steak, however is able to cut tender pieces of chicken. 06/02/23: L lateral pinch: 6# (pt reports she lacks the strength to cut steak but can cut tender chicken); 04/21/2023: NT(Pinch Meter sent out for calibration) Pt. continues to have difficulty cutting meat. 03/10/2023: Left lateral pinch strength: 9#   01/13/2023: Left lateral pinch strength: 9#  12/02/2022: Left pinch: 7# Pt. has difficulty cutting meat. 10/23/2022: Pt. Continues to present with difficulty cutting meat. 10/09/2022: 1#  Eval: 2#. Pt. has difficulty stabilizing utensil, and food while cutting food. Goal status: Discontinued   4.  Pt. Will improve Left hand [facility] skills to be able to be able to independently, and efficiently manipulate small objects during ADL tasks. Baseline: 09/22/23: 38 sec.08/25/2023: 39 sec. 1/0/2025: Left FMC skills: 44 sec. 06/16/2023: Left 45 sec. 06/02/2023: L 47 sec; 04/21/2023:  46 sec. 03/10/2023: 54 sec. 01/13/2023: 51 sec. 12/02/2022: TBD  10/23/2022: Pt. Continues to present with difficulty manipulating small objects. 10/09/2022: Left: 1 min & 35 sec.: Eval: Left: 52 sec. Right: 27 sec. Goal status: Improving, Ongoing    6.: Pt. will improve with left grip strength to be able to hold and pull up pants Baseline: 09/22/23: Left grip strength 20# 08/25/23: Left  grip strength 20# independent hiking pants. 07/28/2023: Left grip strength: 20# 06/16/2023: Left grip strength: 19# Pt. Is now able to pull her pants up.06/02/23: L grip 19#, improving; 04/21/2023: Pt. is able to pull up covers, however has difficulty pulling up pants. 03/10/2023: left  grip strength: 15# 01/13/2023: left  grip strength: 15# 12/02/2022: 17# 10/23/2022: Left: 16# 10/09/2022: Left: 10# Eval:  Left grip strength 11#. Pt. has difficulty holding a full drink  securely with the left hand.             Goal status: Partially met. Discontinue   7. Pt. will improve typing speed, and accuracy in preparation for efficiently typing simple email  message. Baseline: 09/22/23: Pt. Is now using the laptop more. 08/25/2023: TBD 07/28/2023: Pt. now has a working laptop available for her to use. 06/16/2023:  Limited ability to carry-over practice at home due to Pt.'s mouse at home is not working. Pt. Also can not see the keys on her computer at home. 06/02/23: same as 04/21/23; 04/21/2023:  Pt. continues to present with limited speed, and accuracy with typing  03/10/2023: Pt. continues to present with limited speed, and accuracy with typing 01/13/2023: TBD 12/02/2022:  Pt. continues to present with limited speed, and accuracy with typing.10/23/2022: Pt. continues to present with limited speed, and accuracy with typing. 10/09/2022: Pt. presents with difficulty typing. Pt. presents with difficulty typing an email. Typing speed, and accuracy TBD             Goal status: Ongoing                                CLINICAL IMPRESSION:  Pt. required support proximally at the elbow for AAROM stretches at the tabletop, and when reaching up to move the shapes through the shape tower. Pt. required increased time, and assist to use the tweezers, and sustain the resistive grasp on the tweezers in preparation for placing them onto the pegboard. Pt. continues to benefit from OT services to work  towards improving Left UE functioning in order to maximize engagement in, and overall independence with ADLs, and IADL tasks, while reducing risk of adhesive capsulitis.     PERFORMANCE DEFICITS in functional skills including ADLs, IADLs, coordination, dexterity, sensation, ROM, strength, FMC, decreased knowledge of use of DME, vision, and UE functional use, cognitive skills including attention, and psychosocial skills including coping strategies, environmental adaptation, habits, and routines and behaviors.    IMPAIRMENTS are limiting patient from ADLs, IADLs, and social participation.    COMORBIDITIES may have co-morbidities  that affects occupational performance. Patient will benefit  from skilled OT to address above impairments and improve overall function.   MODIFICATION OR ASSISTANCE TO COMPLETE EVALUATION: Min-Moderate modification of tasks or assist with assess necessary to complete an evaluation.   OT OCCUPATIONAL PROFILE AND HISTORY: Detailed assessment: Review of records and additional review of physical, cognitive, psychosocial history related to current functional performance.   CLINICAL DECISION MAKING: Moderate - several treatment options, min-mod task modification necessary   REHAB POTENTIAL: Good   EVALUATION COMPLEXITY: Moderate      PLAN: OT FREQUENCY: 1x/week   OT DURATION: 12 weeks   PLANNED INTERVENTIONS: self care/ADL training, therapeutic exercise, therapeutic activity, neuromuscular re-education, manual therapy, passive range of motion, and paraffin   RECOMMENDED OTHER SERVICES:    CONSULTED AND AGREED WITH PLAN OF CARE: Patient   PLAN FOR NEXT SESSION:  Left hand strengthening and coordination exercises  Olegario Messier, MS, OTR/L    10/27/23, 11:40 AM

## 2023-10-29 ENCOUNTER — Encounter: Payer: Medicare HMO | Admitting: Occupational Therapy

## 2023-10-30 ENCOUNTER — Encounter: Payer: Medicare HMO | Admitting: Physical Medicine and Rehabilitation

## 2023-11-03 ENCOUNTER — Ambulatory Visit: Payer: Medicare HMO | Admitting: Occupational Therapy

## 2023-11-03 DIAGNOSIS — M6281 Muscle weakness (generalized): Secondary | ICD-10-CM

## 2023-11-04 NOTE — Therapy (Signed)
 Occupational Therapy Neuro Treatment Note    Patient Name: Alyssa Mayo MRN: [id number] DOB:[birth year], 78 y.o., female Today's Date: 03/14/2022   PCP: Lyle San, MD REFERRING PROVIDER: Johny Nap, NP     OT End of Session - 11/04/23 0916     Visit Number 65    Number of Visits 81    Date for OT Re-Evaluation 11/17/23    Authorization Type Progress reporting period starting 09/22/23    OT Start Time 1105    OT Stop Time 1145    OT Time Calculation (min) 40 min    Activity Tolerance Patient tolerated treatment well    Behavior During Therapy [facility] for tasks assessed/performed                          Past Medical History:  Diagnosis Date   Abnormal levels of other serum enzymes 07/17/2015   Arthritis     Constipation 07/11/2015   COVID-19 03/06/2021   Diabetes mellitus without complication (HCC)     Elevated liver enzymes     Fatty liver     Generalized abdominal pain 07/11/2015   Hyperlipidemia     Hypertension     Lifelong obesity     Macular degeneration     Morbid obesity due to excess calories (HCC) 07/11/2015   Other fatigue 07/11/2015   Sleep apnea     Spinal headache           Past Surgical History:  Procedure Laterality Date   ABDOMINAL HYSTERECTOMY       APPENDECTOMY       CATARACT EXTRACTION       CERVICAL LAMINECTOMY   07/21/1985   CESAREAN SECTION       COLONOSCOPY   07/21/2012    diverticulosis, [facility] Dr. Magdaleno Schooling    COLONOSCOPY WITH PROPOFOL N/A 02/04/2019    Procedure: COLONOSCOPY WITH PROPOFOL;  Surgeon: Deveron Fly, MD;  Location: [facility] ENDOSCOPY;  Service: Endoscopy;  Laterality: N/A;   COLONOSCOPY WITH PROPOFOL N/A 03/18/2021    Procedure: COLONOSCOPY WITH PROPOFOL;  Surgeon: Shane Darling, MD;  Location: [facility] ENDOSCOPY;  Service: Endoscopy;  Laterality: N/A;  DM   DIAGNOSTIC LAPAROSCOPY       ESOPHAGOGASTRODUODENOSCOPY (EGD) WITH PROPOFOL N/A 02/04/2019    Procedure: ESOPHAGOGASTRODUODENOSCOPY (EGD) WITH  PROPOFOL;  Surgeon: Deveron Fly, MD;  Location: [facility] ENDOSCOPY;  Service: Endoscopy;  Laterality: N/A;   ESOPHAGOGASTRODUODENOSCOPY (EGD) WITH PROPOFOL N/A 03/18/2021    Procedure: ESOPHAGOGASTRODUODENOSCOPY (EGD) WITH PROPOFOL;  Surgeon: Shane Darling, MD;  Location: [facility] ENDOSCOPY;  Service: Endoscopy;  Laterality: N/A;   LOOP RECORDER INSERTION N/A 08/05/2021    Procedure: LOOP RECORDER INSERTION;  Surgeon: Tammie Fall, MD;  Location: [facility] INVASIVE CV LAB;  Service: Cardiovascular;  Laterality: N/A;   SPINE SURGERY       TUBAL LIGATION            Patient Active Problem List    Diagnosis Date Noted   Paroxysmal atrial fibrillation (HCC) 08/08/2022   Hypercoagulable state due to paroxysmal atrial fibrillation (HCC) 08/08/2022   Cerebral edema (HCC) 08/07/2021   OSA (obstructive sleep apnea) 08/07/2021   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   CVA (cerebral vascular accident) (HCC) 08/01/2021   GERD (gastroesophageal reflux disease) 07/20/2019   Advanced care planning/counseling discussion 12/17/2016   Skin lesions, generalized 11/13/2016   Chronic right hip pain 06/17/2016   Vitamin D deficiency 09/26/2015   Diastasis recti  08/08/2015   Elevated serum GGT level 07/24/2015   Essential hypertension 07/17/2015   Fatty liver 07/17/2015   Elevated alkaline phosphatase level 07/17/2015   Elevated serum glutamic pyruvic transaminase (SGPT) level 07/17/2015   Abnormal finding on EKG 07/11/2015   Morbid obesity (HCC) 07/11/2015   Colon, diverticulosis 07/11/2015   Abdominal wall hernia 07/11/2015   DM (diabetes mellitus), type 2 with neurological complications (HCC)     Hyperlipidemia      ONSET DATE: 08/01/2021   REFERRING DIAG: CVA   THERAPY DIAG:  Muscle weakness (generalized)   Rationale for Evaluation and Treatment Rehabilitation   SUBJECTIVE:   SUBJECTIVE STATEMENT:       Pt. reports that she had a wonderful birthday visit from her  brother.  Pt accompanied by: self   PERTINENT HISTORY:  Pt. is a 78 y.o. female who had a unexpectedly hospitalized from 2/12-2/14/2024 for COVID-19, and a UTI.  Pt. 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 Pt. attended inpatient rehab from 08/07/2021-08/22/2021. Pt. received Home health therapy services this past spring.  Pt. has recently had an assessment through driver rehabilitation services at [facility] with recommendations for referrals for outpatient OT/PT, and ST services. Pt. PMHx includes: HTN, Hyperlipidemia, Macular degeneration, DM, and Obesity.   PRECAUTIONS: None   WEIGHT BEARING RESTRICTIONS No   PAIN: Are you having pain?  No pain   FALLS: Has patient fallen in last 6 months? Yes.   LIVING ENVIRONMENT: Lives with: lives with their family  Son  [name] Lives in: House/apartment Main living are on one floor Stairs:  2 steps to enter Has following equipment at home: Single point cane, Wheelchair (manual), shower chair, Grab bars, bed rail, and rubber mat.   PLOF: Independent   PATIENT GOALS: To be able to Drive, and cook again   OBJECTIVE:    HAND DOMINANCE: Right    ADLs:  Transfers/ambulation related to ADLs: Independent Eating: Independent Grooming: [employer] Pt. reports being unable to use her left hand to perform haircare.  UB Dressing: Independent pullover shirt, independent now with fastening a bra LB Dressing: Independent donning pants socks, and slide on shoes Toileting: Independent Bathing: Independent Tub Shower transfers: Walk-in shower Independent now    IADLs: Shopping: Needs to be accompanied to the grocery store. Light housekeeping: Do  Meal Prep:  Is able to perform light meal prep Community mobility: Relies on son, and friend Medication management: Son sets up Mining engineer management:TBD Handwriting: TBD   MOBILITY STATUS: Hx of falls out of bed     ACTIVITY TOLERANCE: Activity  tolerance:  10-20 min. Before rest break   FUNCTIONAL OUTCOME MEASURES: FOTO: 61  TR score: 62  10/09/2022: FOTO: 49  (Recert) 06/02/23: 65  07/28/2023:  FOTO score: 62   UPPER EXTREMITY ROM      Active ROM Right eval Left eval Left  10/09/2022 Left 10/23/22 Left 12/02/2022 Left 01/13/2023 Left  03/10/2023 Left 04/21/23 Left 05/12/23 Left 06/02/23 Left 06/16/23 Left 07/28/23 Left 08/25/23 Left 09/22/23  Shoulder flexion WFL 54(74) scaption 0(55) 26(80) 32(82) 35(82) 35(85) 40(87) 70(96) 77 (104) 80(108) 83(108) 85(108) 87(108)  Shoulder abduction WFL 58(75) 32(54) 32(50) 45(60) 47(70) 62(72) 48(76) 73(102) 70 (100) 76(106) 80(110) 84(110) 86(110)  Shoulder adduction                  Shoulder extension                  Shoulder internal rotation  Thumb to L3 (T10) Thumb to L3 (T10) Thumb L3 (T10)  Thumb to L3(T10)  Shoulder external rotation            (With elbow at side) 45 (60) (With elbow at side) 47 (60) (With elbow at side) 47 With elbow at side) 48 With elbow at side 48  Elbow flexion [facility] WFL 145 145 145 145 145 147  WFL [facility] WFL WFL  Elbow extension [facility] WFL 10(0) 10(0) WFL WFL [facility]  [facility] WFL  Wrist flexion WFL TBD [facility] WFL [facility]  [facility] WFL WFL  Wrist extension WFL TBD [facility]  [facility] WFL WFL  Wrist ulnar deviation                  Wrist radial deviation                  Wrist pronation             [facility] WFL WFL  Wrist supination             [facility] WFL WFL  (Blank rows = not tested)     UPPER EXTREMITY MMT:      MMT Right eval Left eval Left 10/09/2022 Left 10/23/2022 Left 12/02/2022 Left 01/13/2023 Left 03/10/2023 Left 04/21/23 Left 06/02/23 Left 06/16/23 Left 07/28/23 Left 08/25/23 Left 09/22/23  Shoulder flexion 4+/5 2+/5 1/5 2-/5 2-/5 2-/5 2-/5 2-/5 2- 2+/5 3-/5 3-/5 3-/5  Shoulder abduction 4+/5 2+/5 2-/5 2-/5 2-/5 2-/5 2-/5 2-/5 2- 2+/5 3-/5 3-/5 3-/5  Shoulder adduction                  Shoulder extension                 Shoulder internal rotation           4- 4-/5  4/5 4+/5  Shoulder external rotation           3- 3-/5  3-/5 3-/5  Middle trapezius                 Lower trapezius                 Elbow flexion 4+/5 3+/5 3/5 3/5 3+/5 3+/5 4/5 4/5 4+/5 4+/5 4+/5 5/5 5/5  Elbow extension 4+/5 3+/5 3/5 3/5 3+/5 3+/5 4/5 4/5 4/5 4+/5 4+/5 4+/5 5/5  Wrist flexion 4+/5 TBD 3/5 3/5 3+/5 3+/5 4/5 4/5 4+/5 4+/5 4+/5 4+/5 5/5  Wrist extension 4+/5 TBD 3/5 3/5 3+/5 3+/5 4/5 4/5 4/5 4/5 4+/5 4+/5 5/5  Wrist ulnar deviation                 Wrist radial deviation                 Wrist pronation                 Wrist supination                 (Blank rows = not tested)   HAND FUNCTION: Grip strength: Right: 33 lbs; Left: 11 lbs, Lateral pinch: Right: 17 lbs, Left: 2 lbs, and 3 point pinch: Right: 17 lbs, Left: 2 lbs  10/09/2022: Grip strength: Right: 33 lbs; Left: 10 lbs, Lateral pinch: Right: 17 lbs, Left: 1 lbs, and 3 point pinch: Right: 17 lbs, Left: 1 lbs   10/23/2022: Grip strength: Right: 33  lbs; Left: 16 lbs, Lateral pinch: Right: 17 lbs, Left: 1 lbs, and 3 point pinch: Right: 17 lbs, Left: 1 lbs  12/02/2022: Grip strength: Right: 33 lbs; Left: 17 lbs, Lateral pinch: Right: 17 lbs, Left: 7 lbs, and 3 point pinch: Right: 17 lbs, Left: 3 lbs   01/13/2023: Grip strength: Right: 33 lbs; Left: 15 lbs, Lateral pinch: Right: 17 lbs, Left: 9 lbs, and 3 point pinch: Right: 17 lbs, Left: 7 lbs  03/10/2023: Grip strength: Right: 33 lbs; Left: 15 lbs, Lateral pinch: Right: 17 lbs, Left: 9 lbs, and 3 point pinch: Right: 17 lbs, Left: 7 lbs   04/21/2023: Grip strength: Right: 34 lbs; Left: 13 lbs, Lateral pinch: NT 3 point pinch: Right: NT  06/02/23: Grip strength: Right: 34 lbs; Left: 19 lbs, Lateral pinch: Right: 16 lbs; Left: 6 lbs; 3 point pinch: Right: 13 lbs, Left: 7 lbs   06/16/2023: Grip strength: Right: 39 lbs; Left: 19 lbs, Lateral pinch: Right: 16 lbs; Left: 5 lbs; 3 point pinch:  Right: 13 lbs, Left: 9 lbs   07/28/2023: Grip strength: Right: 39 lbs; Left: 20 lbs, Lateral pinch: Right: 16 lbs; Left: 7 lbs; 3 point pinch: Right: 13 lbs, Left: 9 lbs    08/25/2023: Grip strength: Right: 39 lbs; Left: 20 lbs, Lateral pinch: Right: 16 lbs; Left: 7 lbs; 3 point pinch: Right: 13 lbs, Left: 9 lbs   09/22/2023: Grip strength: Right: 39 lbs; Left: 20 lbs, Lateral pinch: Right: 16 lbs; Left: 7 lbs; 3 point pinch: Right: 13 lbs, Left: 9 lbs                        COORDINATION:   9 Hole Peg test: Right: 27 sec.  sec; Left: 52 sec.  10/09/2022: 9 Hole Peg test: Right: 27 sec.  sec; Left: 1 min. & 35 sec.  01/13/2023: 9 Hole Peg test: Right: 27 sec.  sec; Left: 51. Sec.  03/10/2023: 9 Hole Peg test: Right: 27 sec.  sec; Left: 54 Sec.  04/21/2023: 9 Hole Peg test: Right: 27 sec.  sec; Left: 46 Sec.  06/02/23: 9 hole Peg test: Left: 47 sec  06/15/23: 9 hole Peg test: Left: 45 sec  07/28/2023: 9 hole Peg test: Left: 44 sec  08/25/2023: 9 hole Peg test: Left: 39 sec  09/22/23: 9 hole peg test: Left: 38 sec,     SENSATION: Light touch: WFL Proprioception: WFL   COGNITION: Overall cognitive status: Within functional limits for tasks assessed   VISION: Subjective report: Glasses. Just received a new prescription to increase bifocal strength Baseline vision: Macular Degeneration Visual history: macular degeneration   VISION ASSESSMENT: To be further assessed in functional context  Left sided Awareness     PERCEPTION: Limited left sided awareness   TODAY'S TREATMENT:  11/03/2023  There. Ex.:   -UE strengthening  using the SciFit for 10 min. with constant monitoring of the BUE on level 4.0  - AROM with PROM to the end range for left  shoulder flexion, and abduction. -AAROM shoulder at the tabletop surface -Performed BUE strengthening using a 2# dowel ex. 2/2 to weakness. Bilateral shoulder flexion, chest press, circular patterns, and elbow flexion/extension for  1 set  10 reps each with cues for visual demonstration  There. Activities:  -Pt. worked on LUE functional reaching for [employer], and moving them through 3 levels of horizontal rungs placed at progressively higher heights.    PATIENT EDUCATION: Education details: UE strengthening, FMC,  functional reach Person educated: Patient Education method: verbal cues Education comprehension: verbalized understanding    HOME EXERCISE PROGRAM:  Reviewed home activities to enhance left hand digit extension during typing skills.   GOALS: Goals reviewed with patient? Yes     SHORT TERM GOALS: Target date: 07/21/23   Pt. Will improve FOTO score by 2 points to reflect improved pt. perceived functional performance Baseline: 07/28/2023: FOTO score: 62, 06/16/2023: FOTO score: 70 06/02/23: 65; 04/21/2023: 49 03/10/2023: 59 01/13/2023: 49 12/01/2022: FOTO: 51 10/23/2022: FOTO 49 10/09/2022: FOTO 49, FOTO: 61 TR score: 62 Goal status:  Goal discharged   LONG TERM GOALS: Target date: 11/17/2023   Pt. Will improve left shoulder ROM by 10 degrees to be able able to efficiently apply deodorant Baseline: 09/22/23: shoulder flexion 87(108), abd 86(110)  With increased time to complete 08/25/23: shoulder flexion 85(108), abd 84(110)  07/28/2023: shoulder flexion 83(108), abd 80(110)  06/16/23: shoulder flexion 80 (108), abd 76(106) Pt. Is able to apply deodorant to the left, however takes increased time.; 06/02/23: shoulder flexion 77 (104), abd 70 (100); 04/21/2023: shoulder flexion: 40(87), Abduction: 48(76) 03/10/2023: Shoulder flexion: 35(85) 01/13/2023:  Shoulder flexion: 35(82), Abduction: 47(70) 12/02/2022: Shoulder flexion: 32(82), Abduction: 45(60)  10/23/2022: Shoulder flexion: 26(80), Abduction: 32(50) 10/09/2022: flexion: 0(55), Abduction: 32(54) Goal status: Improving, ongoing   2.  Pt. will be able to independently hold and use a blow dryer, and brush for hair care. Baseline: 09/22/23: Pt. Is able to reach up  higher to blow dry her hair, however has difficulty  with combinations of distal movements while sustaining the UE in elevation.  08/25/23: Pt. Continues to be able to hold a blow dryer, and is able to move the blow dryer within her hand more efficiently, however reaches only to L side of head and lower part of back of head with brush and blow dryer d/t limited ER ROM and strength 07/28/2023: Pt can hold a blow dryer, and is able to move the blow dryer within her hand, however reaches only to L side of head and lower part of back of head with brush and blow dryer d/t limited ER ROM and strength11/26/2024: shoulder flexion 80 (108), abd 76(106) 06/02/23: Pt can hold a blow dryer but reaches only to L side of head and lower part of back of head with brush and blow dryer d/t limited ER ROM and strength; 04/21/2023: Pt. is able to hold the hair dryer in the left hand, however is unable to reach up to dry it, and has difficulty using a brush with the left UE. 03/10/2023: Pt. continues to be able to hold the hair dryer in the left hand, however is unable to reach up to dry it, and has difficulty using a brush 01/13/2023: Pt. Can hold the hair dryer in the left hand, however is unable to reach up to dry it, and has difficulty using a brush. 12/02/2022: Pt. is unable to actively elevate he left shoulder in order to blow dry her hair. 10/23/2022: Pt. continues to be unable to hold a blow dryer. Eval: Pt. Is unable to sustain her BUEs in elevation, and use a blow dryer, and brush. 10/09/2022: Pt. Is unable to hold a blow dryer. Eval: Pt. Is unable to sustain her BUEs in elevation, and use a blow dryer, and brush. Goal status: improving/ongoing   3.  Pt. Will improve left lateral pinch strength by 3# to be able to independently cut meat. Baseline: 3/0/4/25: Left Lateral Pinch  7# 08/25/2023: Lateral  Pinch strength: 7# 07/28/2023:  Lateral pinch: Left: 7 lbs.06/16/2023: Lateral pinch: Left: 5 lbs. Pt. Has difficulty cutting steak,  however is able to cut tender pieces of chicken. 06/02/23: L lateral pinch: 6# (pt reports she lacks the strength to cut steak but can cut tender chicken); 04/21/2023: NT(Pinch Meter sent out for calibration) Pt. continues to have difficulty cutting meat. 03/10/2023: Left lateral pinch strength: 9#   01/13/2023: Left lateral pinch strength: 9#  12/02/2022: Left pinch: 7# Pt. has difficulty cutting meat. 10/23/2022: Pt. Continues to present with difficulty cutting meat. 10/09/2022: 1#  Eval: 2#. Pt. has difficulty stabilizing utensil, and food while cutting food. Goal status: Discontinued   4.  Pt. Will improve Left hand [facility] skills to be able to be able to independently, and efficiently manipulate small objects during ADL tasks. Baseline: 09/22/23: 38 sec.08/25/2023: 39 sec. 1/0/2025: Left FMC skills: 44 sec. 06/16/2023: Left 45 sec. 06/02/2023: L 47 sec; 04/21/2023:  46 sec. 03/10/2023: 54 sec. 01/13/2023: 51 sec. 12/02/2022: TBD  10/23/2022: Pt. Continues to present with difficulty manipulating small objects. 10/09/2022: Left: 1 min & 35 sec.: Eval: Left: 52 sec. Right: 27 sec. Goal status: Improving, Ongoing    6.: Pt. will improve with left grip strength to be able to hold and pull up pants Baseline: 09/22/23: Left grip strength 20# 08/25/23: Left  grip strength 20# independent hiking pants. 07/28/2023: Left grip strength: 20# 06/16/2023: Left grip strength: 19# Pt. Is now able to pull her pants up.06/02/23: L grip 19#, improving; 04/21/2023: Pt. is able to pull up covers, however has difficulty pulling up pants. 03/10/2023: left  grip strength: 15# 01/13/2023: left  grip strength: 15# 12/02/2022: 17# 10/23/2022: Left: 16# 10/09/2022: Left: 10# Eval:  Left grip strength 11#. Pt. has difficulty holding a full drink  securely with the left hand.             Goal status: Partially met. Discontinue   7. Pt. will improve typing speed, and accuracy in preparation for efficiently typing simple email message. Baseline: 09/22/23:  Pt. Is now using the laptop more. 08/25/2023: TBD 07/28/2023: Pt. now has a working laptop available for her to use. 06/16/2023:  Limited ability to carry-over practice at home due to Pt.'s mouse at home is not working. Pt. Also can not see the keys on her computer at home. 06/02/23: same as 04/21/23; 04/21/2023:  Pt. continues to present with limited speed, and accuracy with typing  03/10/2023: Pt. continues to present with limited speed, and accuracy with typing 01/13/2023: TBD 12/02/2022:  Pt. continues to present with limited speed, and accuracy with typing.10/23/2022: Pt. continues to present with limited speed, and accuracy with typing. 10/09/2022: Pt. presents with difficulty typing. Pt. presents with difficulty typing an email. Typing speed, and accuracy TBD             Goal status: Ongoing                                CLINICAL IMPRESSION:  Pt. Left shoulder pain has improved overall, and reports no pain today. Pt. continues to require support proximally at the elbow for AAROM stretches at the tabletop, and when reaching up to move the the [employer] rings through the horizontal rungs. Pt. required increased time, and assist to use the tweezers, and sustain the resistive grasp on the tweezers in preparation for placing them onto the pegboard. Pt. continues to benefit from OT services to  work towards improving Left UE functioning in order to maximize engagement in, and overall independence with ADLs, and IADL tasks, while reducing risk of adhesive capsulitis.     PERFORMANCE DEFICITS in functional skills including ADLs, IADLs, coordination, dexterity, sensation, ROM, strength, FMC, decreased knowledge of use of DME, vision, and UE functional use, cognitive skills including attention, and psychosocial skills including coping strategies, environmental adaptation, habits, and routines and behaviors.    IMPAIRMENTS are limiting patient from ADLs, IADLs, and social participation.    COMORBIDITIES may have  co-morbidities  that affects occupational performance. Patient will benefit from skilled OT to address above impairments and improve overall function.   MODIFICATION OR ASSISTANCE TO COMPLETE EVALUATION: Min-Moderate modification of tasks or assist with assess necessary to complete an evaluation.   OT OCCUPATIONAL PROFILE AND HISTORY: Detailed assessment: Review of records and additional review of physical, cognitive, psychosocial history related to current functional performance.   CLINICAL DECISION MAKING: Moderate - several treatment options, min-mod task modification necessary   REHAB POTENTIAL: Good   EVALUATION COMPLEXITY: Moderate      PLAN: OT FREQUENCY: 1x/week   OT DURATION: 12 weeks   PLANNED INTERVENTIONS: self care/ADL training, therapeutic exercise, therapeutic activity, neuromuscular re-education, manual therapy, passive range of motion, and paraffin   RECOMMENDED OTHER SERVICES:    CONSULTED AND AGREED WITH PLAN OF CARE: Patient   PLAN FOR NEXT SESSION:  Left hand strengthening and coordination exercises  Duey Ghent, MS, OTR/L    11/04/23, 9:20 AM

## 2023-11-05 ENCOUNTER — Ambulatory Visit: Payer: Medicare HMO | Admitting: Occupational Therapy

## 2023-11-09 ENCOUNTER — Ambulatory Visit: Payer: Medicare HMO

## 2023-11-09 DIAGNOSIS — I639 Cerebral infarction, unspecified: Secondary | ICD-10-CM | POA: Diagnosis not present

## 2023-11-10 ENCOUNTER — Ambulatory Visit: Payer: Medicare HMO | Admitting: Occupational Therapy

## 2023-11-10 DIAGNOSIS — M6281 Muscle weakness (generalized): Secondary | ICD-10-CM | POA: Diagnosis not present

## 2023-11-10 LAB — CUP PACEART REMOTE DEVICE CHECK
Date Time Interrogation Session: 20250420230939
Implantable Pulse Generator Implant Date: 20230116

## 2023-11-10 NOTE — Therapy (Signed)
 Occupational Therapy Neuro Treatment Note    Patient Name: Alyssa Mayo MRN: [id number] DOB:Nov 17, 1945, 78 y.o., female Today's Date: 03/14/2022   PCP: Alyssa San, MD REFERRING PROVIDER: Johny Nap, NP     OT End of Session - 11/10/23 1612     Visit Number [id number]    Number of Visits 81    Date for OT Re-Evaluation 11/17/23    Authorization Type Progress reporting period starting 09/22/23    OT Start Time 1405    OT Stop Time 1445    OT Time Calculation (min) 40 min    Activity Tolerance Patient tolerated treatment well    Behavior During Therapy [facility] for tasks assessed/performed                          Past Medical History:  Diagnosis Date   Abnormal levels of other serum enzymes 07/17/2015   Arthritis     Constipation 07/11/2015   COVID-19 03/06/2021   Diabetes mellitus without complication (HCC)     Elevated liver enzymes     Fatty liver     Generalized abdominal pain 07/11/2015   Hyperlipidemia     Hypertension     Lifelong obesity     Macular degeneration     Morbid obesity due to excess calories (HCC) 07/11/2015   Other fatigue 07/11/2015   Sleep apnea     Spinal headache           Past Surgical History:  Procedure Laterality Date   ABDOMINAL HYSTERECTOMY       APPENDECTOMY       CATARACT EXTRACTION       CERVICAL LAMINECTOMY   07/21/1985   CESAREAN SECTION       COLONOSCOPY   07/21/2012    diverticulosis, [facility]    COLONOSCOPY WITH PROPOFOL  N/A 02/04/2019    Procedure: COLONOSCOPY WITH PROPOFOL ;  Surgeon: Alyssa Fly, MD;  Location: [facility];  Service: [facility];  Laterality: N/A;   COLONOSCOPY WITH PROPOFOL  N/A 03/18/2021    Procedure: COLONOSCOPY WITH PROPOFOL ;  Surgeon: Alyssa Darling, MD;  Location: [facility];  Service: [facility];  Laterality: N/A;  DM   DIAGNOSTIC LAPAROSCOPY       ESOPHAGOGASTRODUODENOSCOPY (EGD) WITH PROPOFOL  N/A 02/04/2019    Procedure: ESOPHAGOGASTRODUODENOSCOPY (EGD) WITH  PROPOFOL ;  Surgeon: Alyssa Fly, MD;  Location: [facility];  Service: [facility];  Laterality: N/A;   ESOPHAGOGASTRODUODENOSCOPY (EGD) WITH PROPOFOL  N/A 03/18/2021    Procedure: ESOPHAGOGASTRODUODENOSCOPY (EGD) WITH PROPOFOL ;  Surgeon: Alyssa Darling, MD;  Location: [facility];  Service: [facility];  Laterality: N/A;   LOOP RECORDER INSERTION N/A 08/05/2021    Procedure: LOOP RECORDER INSERTION;  Surgeon: Alyssa Fall, MD;  Location: [facility];  Service: Cardiovascular;  Laterality: N/A;   SPINE SURGERY       TUBAL LIGATION            Patient Active Problem List    Diagnosis Date Noted   Paroxysmal atrial fibrillation (HCC) 08/08/2022   Hypercoagulable state due to paroxysmal atrial fibrillation (HCC) 08/08/2022   Cerebral edema (HCC) 08/07/2021   OSA (obstructive sleep apnea) 08/07/2021   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   CVA (cerebral vascular accident) (HCC) 08/01/2021   GERD (gastroesophageal reflux disease) 07/20/2019   Advanced care planning/counseling discussion 12/17/2016   Skin lesions, generalized 11/13/2016   Chronic right hip pain 06/17/2016   Vitamin D  deficiency 09/26/2015   Diastasis recti  08/08/2015   Elevated serum GGT level 07/24/2015   Essential hypertension 07/17/2015   Fatty liver 07/17/2015   Elevated alkaline phosphatase level 07/17/2015   Elevated serum glutamic pyruvic transaminase (SGPT) level 07/17/2015   Abnormal finding on EKG 07/11/2015   Morbid obesity (HCC) 07/11/2015   Colon, diverticulosis 07/11/2015   Abdominal wall hernia 07/11/2015   DM (diabetes mellitus), type 2 with neurological complications (HCC)     Hyperlipidemia      ONSET DATE: 08/01/2021   REFERRING DIAG: CVA   THERAPY DIAG:  Muscle weakness (generalized)   Rationale for Evaluation and Treatment Rehabilitation   SUBJECTIVE:   SUBJECTIVE STATEMENT:       Pt. reports that she had a wonderful birthday visit from her  brother.  Pt accompanied by: self   PERTINENT HISTORY:  Pt. is a 78 y.o. female who had a unexpectedly hospitalized from 2/12-2/14/2024 for COVID-19, and a UTI.  Pt. 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 Pt. attended inpatient rehab from 08/07/2021-08/22/2021. Pt. received Home health therapy services this past spring.  Pt. has recently had an assessment through driver rehabilitation services at [facility] with recommendations for referrals for outpatient OT/PT, and ST services. Pt. PMHx includes: HTN, Hyperlipidemia, Macular degeneration, DM, and Obesity.   PRECAUTIONS: None   WEIGHT BEARING RESTRICTIONS No   PAIN: Are you having pain?  No pain   FALLS: Has patient fallen in last 6 months? Yes.   LIVING ENVIRONMENT: Lives with: lives with their family  Son  Alyssa Mayo Lives in: House/apartment Main living are on one floor Stairs:  2 steps to enter Has following equipment at home: Single point cane, Wheelchair (manual), shower chair, Grab bars, bed rail, and rubber mat.   PLOF: Independent   PATIENT GOALS: To be able to Drive, and cook again   OBJECTIVE:    HAND DOMINANCE: Right    ADLs:  Transfers/ambulation related to ADLs: Independent Eating: Independent Grooming: [employer] Pt. reports being unable to use her left hand to perform haircare.  UB Dressing: Independent pullover shirt, independent now with fastening a bra LB Dressing: Independent donning pants socks, and slide on shoes Toileting: Independent Bathing: Independent Tub Shower transfers: Walk-in shower Independent now    IADLs: Shopping: Needs to be accompanied to the grocery store. Light housekeeping: Do  Meal Prep:  Is able to perform light meal prep Community mobility: Relies on son, and friend Medication management: Son sets up Mining engineer management:TBD Handwriting: TBD   MOBILITY STATUS: Hx of falls out of bed     ACTIVITY TOLERANCE: Activity  tolerance:  10-20 min. Before rest break   FUNCTIONAL OUTCOME MEASURES: FOTO: 61  TR score: 62  10/09/2022: FOTO: 49  (Recert) 06/02/23: 65  07/28/2023:  FOTO score: 62   UPPER EXTREMITY ROM      Active ROM Right eval Left eval Left  10/09/2022 Left 10/23/22 Left 12/02/2022 Left 01/13/2023 Left  03/10/2023 Left 04/21/23 Left 05/12/23 Left 06/02/23 Left 06/16/23 Left 07/28/23 Left 08/25/23 Left 09/22/23  Shoulder flexion WFL 54(74) scaption 0(55) 26(80) 32(82) 35(82) 35(85) 40(87) 70(96) 77 (104) 80(108) 83(108) 85(108) 87(108)  Shoulder abduction WFL 58(75) 32(54) 32(50) 45(60) 47(70) 62(72) 48(76) 73(102) 70 (100) 76(106) 80(110) 84(110) 86(110)  Shoulder adduction                  Shoulder extension                  Shoulder internal rotation  Thumb to L3 (T10) Thumb to L3 (T10) Thumb L3 (T10)  Thumb to L3(T10)  Shoulder external rotation            (With elbow at side) 45 (60) (With elbow at side) 47 (60) (With elbow at side) 47 With elbow at side) 48 With elbow at side 48  Elbow flexion [facility] WFL 145 145 145 145 145 147  WFL [facility] WFL WFL  Elbow extension [facility] WFL 10(0) 10(0) WFL WFL [facility]  [facility] WFL  Wrist flexion WFL TBD [facility] WFL [facility]  [facility] WFL WFL  Wrist extension WFL TBD [facility]  [facility] WFL WFL  Wrist ulnar deviation                  Wrist radial deviation                  Wrist pronation             [facility] WFL WFL  Wrist supination             [facility] WFL WFL  (Blank rows = not tested)     UPPER EXTREMITY MMT:      MMT Right eval Left eval Left 10/09/2022 Left 10/23/2022 Left 12/02/2022 Left 01/13/2023 Left 03/10/2023 Left 04/21/23 Left 06/02/23 Left 06/16/23 Left 07/28/23 Left 08/25/23 Left 09/22/23  Shoulder flexion 4+/5 2+/5 1/5 2-/5 2-/5 2-/5 2-/5 2-/5 2- 2+/5 3-/5 3-/5 3-/5  Shoulder abduction 4+/5 2+/5 2-/5 2-/5 2-/5 2-/5 2-/5 2-/5 2- 2+/5 3-/5 3-/5 3-/5  Shoulder adduction                  Shoulder extension                 Shoulder internal rotation           4- 4-/5  4/5 4+/5  Shoulder external rotation           3- 3-/5  3-/5 3-/5  Middle trapezius                 Lower trapezius                 Elbow flexion 4+/5 3+/5 3/5 3/5 3+/5 3+/5 4/5 4/5 4+/5 4+/5 4+/5 5/5 5/5  Elbow extension 4+/5 3+/5 3/5 3/5 3+/5 3+/5 4/5 4/5 4/5 4+/5 4+/5 4+/5 5/5  Wrist flexion 4+/5 TBD 3/5 3/5 3+/5 3+/5 4/5 4/5 4+/5 4+/5 4+/5 4+/5 5/5  Wrist extension 4+/5 TBD 3/5 3/5 3+/5 3+/5 4/5 4/5 4/5 4/5 4+/5 4+/5 5/5  Wrist ulnar deviation                 Wrist radial deviation                 Wrist pronation                 Wrist supination                 (Blank rows = not tested)   HAND FUNCTION: Grip strength: Right: 33 lbs; Left: 11 lbs, Lateral pinch: Right: 17 lbs, Left: 2 lbs, and 3 point pinch: Right: 17 lbs, Left: 2 lbs  10/09/2022: Grip strength: Right: 33 lbs; Left: 10 lbs, Lateral pinch: Right: 17 lbs, Left: 1 lbs, and 3 point pinch: Right: 17 lbs, Left: 1 lbs   10/23/2022: Grip strength: Right: 33  lbs; Left: 16 lbs, Lateral pinch: Right: 17 lbs, Left: 1 lbs, and 3 point pinch: Right: 17 lbs, Left: 1 lbs  12/02/2022: Grip strength: Right: 33 lbs; Left: 17 lbs, Lateral pinch: Right: 17 lbs, Left: 7 lbs, and 3 point pinch: Right: 17 lbs, Left: 3 lbs   01/13/2023: Grip strength: Right: 33 lbs; Left: 15 lbs, Lateral pinch: Right: 17 lbs, Left: 9 lbs, and 3 point pinch: Right: 17 lbs, Left: 7 lbs  03/10/2023: Grip strength: Right: 33 lbs; Left: 15 lbs, Lateral pinch: Right: 17 lbs, Left: 9 lbs, and 3 point pinch: Right: 17 lbs, Left: 7 lbs   04/21/2023: Grip strength: Right: 34 lbs; Left: 13 lbs, Lateral pinch: NT 3 point pinch: Right: NT  06/02/23: Grip strength: Right: 34 lbs; Left: 19 lbs, Lateral pinch: Right: 16 lbs; Left: 6 lbs; 3 point pinch: Right: 13 lbs, Left: 7 lbs   06/16/2023: Grip strength: Right: 39 lbs; Left: 19 lbs, Lateral pinch: Right: 16 lbs; Left: 5 lbs; 3 point pinch:  Right: 13 lbs, Left: 9 lbs   07/28/2023: Grip strength: Right: 39 lbs; Left: 20 lbs, Lateral pinch: Right: 16 lbs; Left: 7 lbs; 3 point pinch: Right: 13 lbs, Left: 9 lbs    08/25/2023: Grip strength: Right: 39 lbs; Left: 20 lbs, Lateral pinch: Right: 16 lbs; Left: 7 lbs; 3 point pinch: Right: 13 lbs, Left: 9 lbs   09/22/2023: Grip strength: Right: 39 lbs; Left: 20 lbs, Lateral pinch: Right: 16 lbs; Left: 7 lbs; 3 point pinch: Right: 13 lbs, Left: 9 lbs                        COORDINATION:   9 Hole Peg test: Right: 27 sec.  sec; Left: 52 sec.  10/09/2022: 9 Hole Peg test: Right: 27 sec.  sec; Left: 1 min. & 35 sec.  01/13/2023: 9 Hole Peg test: Right: 27 sec.  sec; Left: 51. Sec.  03/10/2023: 9 Hole Peg test: Right: 27 sec.  sec; Left: 54 Sec.  04/21/2023: 9 Hole Peg test: Right: 27 sec.  sec; Left: 46 Sec.  06/02/23: 9 hole Peg test: Left: 47 sec  06/15/23: 9 hole Peg test: Left: 45 sec  07/28/2023: 9 hole Peg test: Left: 44 sec  08/25/2023: 9 hole Peg test: Left: 39 sec  09/22/23: 9 hole peg test: Left: 38 sec,     SENSATION: Light touch: WFL Proprioception: WFL   COGNITION: Overall cognitive status: Within functional limits for tasks assessed   VISION: Subjective report: Glasses. Just received a new prescription to increase bifocal strength Baseline vision: Macular Degeneration Visual history: macular degeneration   VISION ASSESSMENT: To be further assessed in functional context  Left sided Awareness     PERCEPTION: Limited left sided awareness   TODAY'S TREATMENT:  11/03/2023  There. Ex.:   -UE strengthening  using the SciFit for 8 min. with constant monitoring of the BUE on level 4.0  - AROM with PROM to the end range for left shoulder flexion, and abduction. -AAROM shoulder at the tabletop surface  There. Activities:  -Pt. worked on LUE functional reaching for [employer], and moving them through 3 levels of horizontal rungs placed at progressively  higher heights. -Lateral, and 3pt. Pinch strengthening using yellow, red, green, and blue, and black level resistive clips -Facilitated translatory movements moving clips from the lateral pinch position to the 3pt. Pinch position in preparation for securely placing them on the dowel.  PATIENT EDUCATION: Education details: UE strengthening, FMC, functional reach Person educated: Patient Education method: verbal cues Education comprehension: verbalized understanding    HOME EXERCISE PROGRAM:  Reviewed home activities to enhance left hand digit extension during typing skills.   GOALS: Goals reviewed with patient? Yes     SHORT TERM GOALS: Target date: 07/21/23   Pt. Will improve FOTO score by 2 points to reflect improved pt. perceived functional performance Baseline: 07/28/2023: FOTO score: 62, 06/16/2023: FOTO score: 70 06/02/23: 65; 04/21/2023: 49 03/10/2023: 59 01/13/2023: 49 12/01/2022: FOTO: 51 10/23/2022: FOTO 49 10/09/2022: FOTO 49, FOTO: 61 TR score: 62 Goal status:  Goal discharged   LONG TERM GOALS: Target date: 11/17/2023   Pt. Will improve left shoulder ROM by 10 degrees to be able able to efficiently apply deodorant Baseline: 09/22/23: shoulder flexion 87(108), abd 86(110)  With increased time to complete 08/25/23: shoulder flexion 85(108), abd 84(110)  07/28/2023: shoulder flexion 83(108), abd 80(110)  06/16/23: shoulder flexion 80 (108), abd 76(106) Pt. Is able to apply deodorant to the left, however takes increased time.; 06/02/23: shoulder flexion 77 (104), abd 70 (100); 04/21/2023: shoulder flexion: 40(87), Abduction: 48(76) 03/10/2023: Shoulder flexion: 35(85) 01/13/2023:  Shoulder flexion: 35(82), Abduction: 47(70) 12/02/2022: Shoulder flexion: 32(82), Abduction: 45(60)  10/23/2022: Shoulder flexion: 26(80), Abduction: 32(50) 10/09/2022: flexion: 0(55), Abduction: 32(54) Goal status: Improving, ongoing   2.  Pt. will be able to independently hold and use a blow dryer, and brush for  hair care. Baseline: 09/22/23: Pt. Is able to reach up higher to blow dry her hair, however has difficulty  with combinations of distal movements while sustaining the UE in elevation.  08/25/23: Pt. Continues to be able to hold a blow dryer, and is able to move the blow dryer within her hand more efficiently, however reaches only to L side of head and lower part of back of head with brush and blow dryer d/t limited ER ROM and strength 07/28/2023: Pt can hold a blow dryer, and is able to move the blow dryer within her hand, however reaches only to L side of head and lower part of back of head with brush and blow dryer d/t limited ER ROM and strength11/26/2024: shoulder flexion 80 (108), abd 76(106) 06/02/23: Pt can hold a blow dryer but reaches only to L side of head and lower part of back of head with brush and blow dryer d/t limited ER ROM and strength; 04/21/2023: Pt. is able to hold the hair dryer in the left hand, however is unable to reach up to dry it, and has difficulty using a brush with the left UE. 03/10/2023: Pt. continues to be able to hold the hair dryer in the left hand, however is unable to reach up to dry it, and has difficulty using a brush 01/13/2023: Pt. Can hold the hair dryer in the left hand, however is unable to reach up to dry it, and has difficulty using a brush. 12/02/2022: Pt. is unable to actively elevate he left shoulder in order to blow dry her hair. 10/23/2022: Pt. continues to be unable to hold a blow dryer. Eval: Pt. Is unable to sustain her BUEs in elevation, and use a blow dryer, and brush. 10/09/2022: Pt. Is unable to hold a blow dryer. Eval: Pt. Is unable to sustain her BUEs in elevation, and use a blow dryer, and brush. Goal status: improving/ongoing   3.  Pt. Will improve left lateral pinch strength by 3# to be able to independently cut meat. Baseline: 3/0/4/25:  Left Lateral Pinch  7# 08/25/2023: Lateral Pinch strength: 7# 07/28/2023:  Lateral pinch: Left: 7 lbs.06/16/2023: Lateral  pinch: Left: 5 lbs. Pt. Has difficulty cutting steak, however is able to cut tender pieces of chicken. 06/02/23: L lateral pinch: 6# (pt reports she lacks the strength to cut steak but can cut tender chicken); 04/21/2023: NT(Pinch Meter sent out for calibration) Pt. continues to have difficulty cutting meat. 03/10/2023: Left lateral pinch strength: 9#   01/13/2023: Left lateral pinch strength: 9#  12/02/2022: Left pinch: 7# Pt. has difficulty cutting meat. 10/23/2022: Pt. Continues to present with difficulty cutting meat. 10/09/2022: 1#  Eval: 2#. Pt. has difficulty stabilizing utensil, and food while cutting food. Goal status: Discontinued   4.  Pt. Will improve Left hand [facility] skills to be able to be able to independently, and efficiently manipulate small objects during ADL tasks. Baseline: 09/22/23: 38 sec.08/25/2023: 39 sec. 1/0/2025: Left FMC skills: 44 sec. 06/16/2023: Left 45 sec. 06/02/2023: L 47 sec; 04/21/2023:  46 sec. 03/10/2023: 54 sec. 01/13/2023: 51 sec. 12/02/2022: TBD  10/23/2022: Pt. Continues to present with difficulty manipulating small objects. 10/09/2022: Left: 1 min & 35 sec.: Eval: Left: 52 sec. Right: 27 sec. Goal status: Improving, Ongoing    6.: Pt. will improve with left grip strength to be able to hold and pull up pants Baseline: 09/22/23: Left grip strength 20# 08/25/23: Left  grip strength 20# independent hiking pants. 07/28/2023: Left grip strength: 20# 06/16/2023: Left grip strength: 19# Pt. Is now able to pull her pants up.06/02/23: L grip 19#, improving; 04/21/2023: Pt. is able to pull up covers, however has difficulty pulling up pants. 03/10/2023: left  grip strength: 15# 01/13/2023: left  grip strength: 15# 12/02/2022: 17# 10/23/2022: Left: 16# 10/09/2022: Left: 10# Eval:  Left grip strength 11#. Pt. has difficulty holding a full drink  securely with the left hand.             Goal status: Partially met. Discontinue   7. Pt. will improve typing speed, and accuracy in preparation for  efficiently typing simple email message. Baseline: 09/22/23: Pt. Is now using the laptop more. 08/25/2023: TBD 07/28/2023: Pt. now has a working laptop available for her to use. 06/16/2023:  Limited ability to carry-over practice at home due to Pt.'s mouse at home is not working. Pt. Also can not see the keys on her computer at home. 06/02/23: same as 04/21/23; 04/21/2023:  Pt. continues to present with limited speed, and accuracy with typing  03/10/2023: Pt. continues to present with limited speed, and accuracy with typing 01/13/2023: TBD 12/02/2022:  Pt. continues to present with limited speed, and accuracy with typing.10/23/2022: Pt. continues to present with limited speed, and accuracy with typing. 10/09/2022: Pt. presents with difficulty typing. Pt. presents with difficulty typing an email. Typing speed, and accuracy TBD             Goal status: Ongoing                                CLINICAL IMPRESSION:  Pt. tolerated LUE ROM, strengthening, and reaching tasks well. Pt. requires assist proximally at the elbow when reaching for the Sa[place]o rings. Pt. presents with decreased compensation with shoulder hiking proximally when reaching.  Pt. Is improving with pinch strengthening. Fewer cues were required. Pt. continues to benefit from OT services to work towards improving Left UE functioning in order to maximize engagement in, and overall independence with  ADLs and IADL tasks, while reducing risk of adhesive capsulitis.     PERFORMANCE DEFICITS in functional skills including ADLs, IADLs, coordination, dexterity, sensation, ROM, strength, FMC, decreased knowledge of use of DME, vision, and UE functional use, cognitive skills including attention, and psychosocial skills including coping strategies, environmental adaptation, habits, and routines and behaviors.    IMPAIRMENTS are limiting patient from ADLs, IADLs, and social participation.    COMORBIDITIES may have co-morbidities  that affects occupational  performance. Patient will benefit from skilled OT to address above impairments and improve overall function.   MODIFICATION OR ASSISTANCE TO COMPLETE EVALUATION: Min-Moderate modification of tasks or assist with assess necessary to complete an evaluation.   OT OCCUPATIONAL PROFILE AND HISTORY: Detailed assessment: Review of records and additional review of physical, cognitive, psychosocial history related to current functional performance.   CLINICAL DECISION MAKING: Moderate - several treatment options, min-mod task modification necessary   REHAB POTENTIAL: Good   EVALUATION COMPLEXITY: Moderate      PLAN: OT FREQUENCY: 1x/week   OT DURATION: 12 weeks   PLANNED INTERVENTIONS: self care/ADL training, therapeutic exercise, therapeutic activity, neuromuscular re-education, manual therapy, passive range of motion, and paraffin   RECOMMENDED OTHER SERVICES:    CONSULTED AND AGREED WITH PLAN OF CARE: Patient   PLAN FOR NEXT SESSION:  Left hand strengthening and coordination exercises  Duey Ghent, MS, OTR/L    11/10/23, 4:15 PM

## 2023-11-11 ENCOUNTER — Encounter: Payer: Self-pay | Admitting: Internal Medicine

## 2023-11-12 ENCOUNTER — Ambulatory Visit: Payer: Medicare HMO | Admitting: Occupational Therapy

## 2023-11-17 ENCOUNTER — Ambulatory Visit: Payer: Medicare HMO | Admitting: Occupational Therapy

## 2023-11-17 DIAGNOSIS — M6281 Muscle weakness (generalized): Secondary | ICD-10-CM

## 2023-11-17 DIAGNOSIS — R278 Other lack of coordination: Secondary | ICD-10-CM

## 2023-11-17 NOTE — Therapy (Signed)
 Occupational Therapy Neuro Treatment/Discharge Note    Patient Name: Alyssa Mayo MRN: [id number] DOB:[birth year], 78 y.o., female Today's Date: 03/14/2022   PCP: Lyle San, MD REFERRING PROVIDER: Johny Nap, NP     OT End of Session - 11/17/23 1656     Visit Number [id number]    Number of Visits 81    Date for OT Re-Evaluation 11/17/23    Authorization Type Progress reporting period starting 09/22/23    OT Start Time 1152    OT Stop Time 1230    OT Time Calculation (min) 38 min    Activity Tolerance Patient tolerated treatment well    Behavior During Therapy [facility] for tasks assessed/performed                           Past Medical History:  Diagnosis Date   Abnormal levels of other serum enzymes 07/17/2015   Arthritis     Constipation 07/11/2015   COVID-19 03/06/2021   Diabetes mellitus without complication (HCC)     Elevated liver enzymes     Fatty liver     Generalized abdominal pain 07/11/2015   Hyperlipidemia     Hypertension     Lifelong obesity     Macular degeneration     Morbid obesity due to excess calories (HCC) 07/11/2015   Other fatigue 07/11/2015   Sleep apnea     Spinal headache           Past Surgical History:  Procedure Laterality Date   ABDOMINAL HYSTERECTOMY       APPENDECTOMY       CATARACT EXTRACTION       CERVICAL LAMINECTOMY   07/21/1985   CESAREAN SECTION       COLONOSCOPY   07/21/2012    diverticulosis, [facility] Dr. Magdaleno Schooling    COLONOSCOPY WITH PROPOFOL  N/A 02/04/2019    Procedure: COLONOSCOPY WITH PROPOFOL ;  Surgeon: Deveron Fly, MD;  Location: [facility] ENDOSCOPY;  Service: Endoscopy;  Laterality: N/A;   COLONOSCOPY WITH PROPOFOL  N/A 03/18/2021    Procedure: COLONOSCOPY WITH PROPOFOL ;  Surgeon: Shane Darling, MD;  Location: [facility] ENDOSCOPY;  Service: Endoscopy;  Laterality: N/A;  DM   DIAGNOSTIC LAPAROSCOPY       ESOPHAGOGASTRODUODENOSCOPY (EGD) WITH PROPOFOL  N/A 02/04/2019    Procedure: ESOPHAGOGASTRODUODENOSCOPY (EGD)  WITH PROPOFOL ;  Surgeon: Deveron Fly, MD;  Location: [facility] ENDOSCOPY;  Service: Endoscopy;  Laterality: N/A;   ESOPHAGOGASTRODUODENOSCOPY (EGD) WITH PROPOFOL  N/A 03/18/2021    Procedure: ESOPHAGOGASTRODUODENOSCOPY (EGD) WITH PROPOFOL ;  Surgeon: Shane Darling, MD;  Location: [facility] ENDOSCOPY;  Service: Endoscopy;  Laterality: N/A;   LOOP RECORDER INSERTION N/A 08/05/2021    Procedure: LOOP RECORDER INSERTION;  Surgeon: Tammie Fall, MD;  Location: [facility] INVASIVE CV LAB;  Service: Cardiovascular;  Laterality: N/A;   SPINE SURGERY       TUBAL LIGATION            Patient Active Problem List    Diagnosis Date Noted   Paroxysmal atrial fibrillation (HCC) 08/08/2022   Hypercoagulable state due to paroxysmal atrial fibrillation (HCC) 08/08/2022   Cerebral edema (HCC) 08/07/2021   OSA (obstructive sleep apnea) 08/07/2021   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   CVA (cerebral vascular accident) (HCC) 08/01/2021   GERD (gastroesophageal reflux disease) 07/20/2019   Advanced care planning/counseling discussion 12/17/2016   Skin lesions, generalized 11/13/2016   Chronic right hip pain 06/17/2016   Vitamin D  deficiency 09/26/2015   Diastasis  recti 08/08/2015   Elevated serum GGT level 07/24/2015   Essential hypertension 07/17/2015   Fatty liver 07/17/2015   Elevated alkaline phosphatase level 07/17/2015   Elevated serum glutamic pyruvic transaminase (SGPT) level 07/17/2015   Abnormal finding on EKG 07/11/2015   Morbid obesity (HCC) 07/11/2015   Colon, diverticulosis 07/11/2015   Abdominal wall hernia 07/11/2015   DM (diabetes mellitus), type 2 with neurological complications (HCC)     Hyperlipidemia      ONSET DATE: 08/01/2021   REFERRING DIAG: CVA   THERAPY DIAG:  Muscle weakness (generalized)   Rationale for Evaluation and Treatment Rehabilitation   SUBJECTIVE:   SUBJECTIVE STATEMENT:       Pt. reports doing well today  Pt accompanied by: self    PERTINENT HISTORY:  Pt. is a 78 y.o. female who had a unexpectedly hospitalized from 2/12-2/14/2024 for COVID-19, and a UTI.  Pt. 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 Pt. attended inpatient rehab from 08/07/2021-08/22/2021. Pt. received Home health therapy services this past spring.  Pt. has recently had an assessment through driver rehabilitation services at [facility] with recommendations for referrals for outpatient OT/PT, and ST services. Pt. PMHx includes: HTN, Hyperlipidemia, Macular degeneration, DM, and Obesity.   PRECAUTIONS: None   WEIGHT BEARING RESTRICTIONS No   PAIN: Are you having pain?  No pain   FALLS: Has patient fallen in last 6 months? Yes.   LIVING ENVIRONMENT: Lives with: lives with their family  Son  Alyssa Mayo Lives in: House/apartment Main living are on one floor Stairs:  2 steps to enter Has following equipment at home: Single point cane, Wheelchair (manual), shower chair, Grab bars, bed rail, and rubber mat.   PLOF: Independent   PATIENT GOALS: To be able to Drive, and cook again   OBJECTIVE:    HAND DOMINANCE: Right    ADLs:  Transfers/ambulation related to ADLs: Independent Eating: Independent Grooming: [employer] Pt. reports being unable to use her left hand to perform haircare.  UB Dressing: Independent pullover shirt, independent now with fastening a bra LB Dressing: Independent donning pants socks, and slide on shoes Toileting: Independent Bathing: Independent Tub Shower transfers: Walk-in shower Independent now    IADLs: Shopping: Needs to be accompanied to the grocery store. Light housekeeping: Do  Meal Prep:  Is able to perform light meal prep Community mobility: Relies on son, and friend Medication management: Son sets up Mining engineer management:TBD Handwriting: TBD   MOBILITY STATUS: Hx of falls out of bed     ACTIVITY TOLERANCE: Activity tolerance:  10-20 min. Before rest  break   FUNCTIONAL OUTCOME MEASURES: FOTO: 61  TR score: 62  10/09/2022: FOTO: 49  (Recert) 06/02/23: 65  07/28/2023:  FOTO score: 62   UPPER EXTREMITY ROM      Active ROM Right eval Left eval Left  10/09/2022 Left 10/23/22 Left 12/02/2022 Left 01/13/2023 Left  03/10/2023 Left 04/21/23 Left 05/12/23 Left 06/02/23 Left 06/16/23 Left 07/28/23 Left 08/25/23 Left 09/22/23 Left  11/17/23  Shoulder flexion WFL 54(74) scaption 0(55) 26(80) 32(82) 35(82) 35(85) 40(87) 70(96) 77 (104) 80(108) 83(108) 85(108) 87(108) 86(108)  Shoulder abduction WFL 58(75) 32(54) 32(50) 45(60) 47(70) 62(72) 48(76) 73(102) 70 (100) 76(106) 80(110) 84(110) 86(110) 84(110)  Shoulder adduction                   Shoulder extension                   Shoulder internal rotation  Thumb to L3 (T10) Thumb to L3 (T10) Thumb L3 (T10)  Thumb to L3(T10) Thumb to L3 (T10)  Shoulder external rotation            (With elbow at side) 45 (60) (With elbow at side) 47 (60) (With elbow at side) 47 With elbow at side) 48 With elbow at side 48 With elbow at side 48  Elbow flexion [facility] WFL 145 145 145 145 145 147  WFL [facility] WFL  Elbow extension [facility] WFL 10(0) 10(0) WFL WFL [facility]  [facility] WFL  Wrist flexion WFL TBD [facility] WFL [facility]  [facility] WFL WFL  Wrist extension WFL TBD [facility]  [facility] WFL WFL  Wrist ulnar deviation                   Wrist radial deviation                   Wrist pronation             [facility] WFL WFL WFL  Wrist supination             [facility] WFL WFL WFL  (Blank rows = not tested)     UPPER EXTREMITY MMT:      MMT Right eval Left eval Left 10/09/2022 Left 10/23/2022 Left 12/02/2022 Left 01/13/2023 Left 03/10/2023 Left 04/21/23 Left 06/02/23 Left 06/16/23 Left 07/28/23 Left 08/25/23 Left 09/22/23   Shoulder flexion 4+/5 2+/5 1/5 2-/5 2-/5 2-/5 2-/5 2-/5 2- 2+/5 3-/5 3-/5 3-/5   Shoulder abduction 4+/5 2+/5 2-/5 2-/5 2-/5 2-/5 2-/5 2-/5  2- 2+/5 3-/5 3-/5 3-/5   Shoulder adduction                  Shoulder extension                  Shoulder internal rotation           4- 4-/5  4/5 4+/5 4+/5  Shoulder external rotation           3- 3-/5  3-/5 3-/5 3+/5  Middle trapezius                  Lower trapezius                  Elbow flexion 4+/5 3+/5 3/5 3/5 3+/5 3+/5 4/5 4/5 4+/5 4+/5 4+/5 5/5 5/5 5/5  Elbow extension 4+/5 3+/5 3/5 3/5 3+/5 3+/5 4/5 4/5 4/5 4+/5 4+/5 4+/5 5/5 5/5  Wrist flexion 4+/5 TBD 3/5 3/5 3+/5 3+/5 4/5 4/5 4+/5 4+/5 4+/5 4+/5 5/5 5/5  Wrist extension 4+/5 TBD 3/5 3/5 3+/5 3+/5 4/5 4/5 4/5 4/5 4+/5 4+/5 5/5 5/5  Wrist ulnar deviation                  Wrist radial deviation                  Wrist pronation                  Wrist supination                  (Blank rows = not tested)   HAND FUNCTION: Grip strength: Right: 33 lbs; Left: 11 lbs, Lateral pinch: Right: 17 lbs, Left: 2 lbs, and 3 point pinch: Right: 17 lbs, Left: 2 lbs  10/09/2022: Grip strength: Right: 33 lbs; Left: 10 lbs, Lateral pinch: Right: 17 lbs, Left: 1 lbs, and 3 point pinch: Right: 17 lbs, Left: 1 lbs   10/23/2022: Grip strength: Right: 33 lbs; Left: 16 lbs, Lateral pinch: Right: 17 lbs, Left: 1 lbs, and 3 point pinch: Right: 17 lbs, Left: 1 lbs  12/02/2022: Grip strength: Right: 33 lbs; Left: 17 lbs, Lateral pinch: Right: 17 lbs, Left: 7 lbs, and 3 point pinch: Right: 17 lbs, Left: 3 lbs   01/13/2023: Grip strength: Right: 33 lbs; Left: 15 lbs, Lateral pinch: Right: 17 lbs, Left: 9 lbs, and 3 point pinch: Right: 17 lbs, Left: 7 lbs  03/10/2023: Grip strength: Right: 33 lbs; Left: 15 lbs, Lateral pinch: Right: 17 lbs, Left: 9 lbs, and 3 point pinch: Right: 17 lbs, Left: 7 lbs   04/21/2023: Grip strength: Right: 34 lbs; Left: 13 lbs, Lateral pinch: NT 3 point pinch: Right: NT  06/02/23: Grip strength: Right: 34 lbs; Left: 19 lbs, Lateral pinch: Right: 16 lbs; Left: 6 lbs; 3 point pinch: Right: 13 lbs, Left: 7 lbs   06/16/2023: Grip  strength: Right: 39 lbs; Left: 19 lbs, Lateral pinch: Right: 16 lbs; Left: 5 lbs; 3 point pinch: Right: 13 lbs, Left: 9 lbs   07/28/2023: Grip strength: Right: 39 lbs; Left: 20 lbs, Lateral pinch: Right: 16 lbs; Left: 7 lbs; 3 point pinch: Right: 13 lbs, Left: 9 lbs    08/25/2023: Grip strength: Right: 39 lbs; Left: 20 lbs, Lateral pinch: Right: 16 lbs; Left: 7 lbs; 3 point pinch: Right: 13 lbs, Left: 9 lbs   09/22/2023: Grip strength: Right: 39 lbs; Left: 20 lbs, Lateral pinch: Right: 16 lbs; Left: 7 lbs; 3 point pinch: Right: 13 lbs, Left: 9 lbs   11/17/23: Grip strength: Right: 39 lbs; Left: 20 lbs, Pinch strength: N/A 2/2 pinch gauge out for calibration                          COORDINATION:   9 Hole Peg test: Right: 27 sec.  sec; Left: 52 sec.  10/09/2022: 9 Hole Peg test: Right: 27 sec.  sec; Left: 1 min. & 35 sec.  01/13/2023: 9 Hole Peg test: Right: 27 sec.  sec; Left: 51. Sec.  03/10/2023: 9 Hole Peg test: Right: 27 sec.  sec; Left: 54 Sec.  04/21/2023: 9 Hole Peg test: Right: 27 sec.  sec; Left: 46 Sec.  06/02/23: 9 hole Peg test: Left: 47 sec  06/15/23: 9 hole Peg test: Left: 45 sec  07/28/2023: 9 hole Peg test: Left: 44 sec  08/25/2023: 9 hole Peg test: Left: 39 sec  09/22/23: 9 hole peg test: Left: 38 sec.  09/22/23: 9 hole peg test: Left: 39 sec.      SENSATION: Light touch: WFL Proprioception: WFL   COGNITION: Overall cognitive status: Within functional limits for tasks assessed   VISION: Subjective report: Glasses. Just received a new prescription to increase bifocal strength Baseline vision: Macular Degeneration Visual history: macular degeneration   VISION ASSESSMENT: To be further assessed in functional context  Left sided Awareness     PERCEPTION: Limited left sided awareness   TODAY'S TREATMENT:  11/03/2023  There. Ex.:   -UE strengthening  using the SciFit for 8 min. with constant monitoring of the BUE on level 4.0  - AROM with PROM to  the end range for left shoulder flexion, and abduction.    Measurements were obtained, and goals were reviewed with  the Patient     PATIENT EDUCATION: Education details: UE strengthening, FMC, functional reach Person educated: Patient Education method: verbal cues Education comprehension: verbalized understanding    HOME EXERCISE PROGRAM:   Reviewed home activities to enhance left hand digit extension during typing skills.   GOALS: Goals reviewed with patient? Yes     SHORT TERM GOALS: Target date: 07/21/23   Pt. Will improve FOTO score by 2 points to reflect improved pt. perceived functional performance Baseline: 07/28/2023: FOTO score: 62, 06/16/2023: FOTO score: 70 06/02/23: 65; 04/21/2023: 49 03/10/2023: 59 01/13/2023: 49 12/01/2022: FOTO: 51 10/23/2022: FOTO 49 10/09/2022: FOTO 49, FOTO: 61 TR score: 62 Goal status:  Goal discharged   LONG TERM GOALS: Target date: 11/17/2023   Pt. Will improve left shoulder ROM by 10 degrees to be able able to efficiently apply deodorant Baseline: 11/17/23: flexion 86(108) abduction: 84(110) 09/22/23: shoulder flexion 87(108), abd 86(110)  With increased time to complete 08/25/23: shoulder flexion 85(108), abd 84(110)  07/28/2023: shoulder flexion 83(108), abd 80(110)  06/16/23: shoulder flexion 80 (108), abd 76(106) Pt. Is able to apply deodorant to the left, however takes increased time.; 06/02/23: shoulder flexion 77 (104), abd 70 (100); 04/21/2023: shoulder flexion: 40(87), Abduction: 48(76) 03/10/2023: Shoulder flexion: 35(85) 01/13/2023:  Shoulder flexion: 35(82), Abduction: 47(70) 12/02/2022: Shoulder flexion: 32(82), Abduction: 45(60)  10/23/2022: Shoulder flexion: 26(80), Abduction: 32(50) 10/09/2022: flexion: 0(55), Abduction: 32(54) Goal status:  Achieved   2.  Pt. will be able to independently hold and use a blow dryer, and brush for hair care. Baseline: 11/17/23: Pt. Is now able to reach up, and sustain UEs in elevation to complete hair care on the  top of her head. 09/22/23: Pt. Is able to reach up higher to blow dry her hair, however has difficulty  with combinations of distal movements while sustaining the UE in elevation.  08/25/23: Pt. Continues to be able to hold a blow dryer, and is able to move the blow dryer within her hand more efficiently, however reaches only to L side of head and lower part of back of head with brush and blow dryer d/t limited ER ROM and strength 07/28/2023: Pt can hold a blow dryer, and is able to move the blow dryer within her hand, however reaches only to L side of head and lower part of back of head with brush and blow dryer d/t limited ER ROM and strength11/26/2024: shoulder flexion 80 (108), abd 76(106) 06/02/23: Pt can hold a blow dryer but reaches only to L side of head and lower part of back of head with brush and blow dryer d/t limited ER ROM and strength; 04/21/2023: Pt. is able to hold the hair dryer in the left hand, however is unable to reach up to dry it, and has difficulty using a brush with the left UE. 03/10/2023: Pt. continues to be able to hold the hair dryer in the left hand, however is unable to reach up to dry it, and has difficulty using a brush 01/13/2023: Pt. Can hold the hair dryer in the left hand, however is unable to reach up to dry it, and has difficulty using a brush. 12/02/2022: Pt. is unable to actively elevate he left shoulder in order to blow dry her hair. 10/23/2022: Pt. continues to be unable to hold a blow dryer. Eval: Pt. Is unable to sustain her BUEs in elevation, and use a blow dryer, and brush. 10/09/2022: Pt. Is unable to hold a blow dryer. Eval: Pt. Is unable to sustain her  BUEs in elevation, and use a blow dryer, and brush. Goal status: Achieved   3.  Pt. Will improve left lateral pinch strength by 3# to be able to independently cut meat. Baseline: 3/0/4/25: Left Lateral Pinch  7# 08/25/2023: Lateral Pinch strength: 7# 07/28/2023:  Lateral pinch: Left: 7 lbs.06/16/2023: Lateral pinch: Left: 5  lbs. Pt. Has difficulty cutting steak, however is able to cut tender pieces of chicken. 06/02/23: L lateral pinch: 6# (pt reports she lacks the strength to cut steak but can cut tender chicken); 04/21/2023: NT(Pinch Meter sent out for calibration) Pt. continues to have difficulty cutting meat. 03/10/2023: Left lateral pinch strength: 9#   01/13/2023: Left lateral pinch strength: 9#  12/02/2022: Left pinch: 7# Pt. has difficulty cutting meat. 10/23/2022: Pt. Continues to present with difficulty cutting meat. 10/09/2022: 1#  Eval: 2#. Pt. has difficulty stabilizing utensil, and food while cutting food. Goal status: Discontinued   4.  Pt. Will improve Left hand [facility] skills to be able to be able to independently, and efficiently manipulate small objects during ADL tasks. Baseline: 11/17/23: 39 sec. Pt. Is able to manipulate small objects independently, and efficiently 09/22/23: 38 sec.08/25/2023: 39 sec. 1/0/2025: Left FMC skills: 44 sec. 06/16/2023: Left 45 sec. 06/02/2023: L 47 sec; 04/21/2023:  46 sec. 03/10/2023: 54 sec. 01/13/2023: 51 sec. 12/02/2022: TBD  10/23/2022: Pt. Continues to present with difficulty manipulating small objects. 10/09/2022: Left: 1 min & 35 sec.: Eval: Left: 52 sec. Right: 27 sec. Goal status: Achieved    6.: Pt. will improve with left grip strength to be able to hold and pull up pants Baseline: 09/22/23: Left grip strength 20# 08/25/23: Left  grip strength 20# independent hiking pants. 07/28/2023: Left grip strength: 20# 06/16/2023: Left grip strength: 19# Pt. Is now able to pull her pants up.06/02/23: L grip 19#, improving; 04/21/2023: Pt. is able to pull up covers, however has difficulty pulling up pants. 03/10/2023: left  grip strength: 15# 01/13/2023: left  grip strength: 15# 12/02/2022: 17# 10/23/2022: Left: 16# 10/09/2022: Left: 10# Eval:  Left grip strength 11#. Pt. has difficulty holding a full drink  securely with the left hand.             Goal status: Partially met. Discontinue   7. Pt.  will improve typing speed, and accuracy in preparation for efficiently typing simple email message. Baseline: 11/17/23: Pt. Reports limited laptop use  09/22/23: Pt. Is now using the laptop more. 08/25/2023: TBD 07/28/2023: Pt. now has a working laptop available for her to use. 06/16/2023:  Limited ability to carry-over practice at home due to Pt.'s mouse at home is not working. Pt. Also can not see the keys on her computer at home. 06/02/23: same as 04/21/23; 04/21/2023:  Pt. continues to present with limited speed, and accuracy with typing  03/10/2023: Pt. continues to present with limited speed, and accuracy with typing 01/13/2023: TBD 12/02/2022:  Pt. continues to present with limited speed, and accuracy with typing.10/23/2022: Pt. continues to present with limited speed, and accuracy with typing. 10/09/2022: Pt. presents with difficulty typing. Pt. presents with difficulty typing an email. Typing speed, and accuracy TBD             Goal status: Discontinued                                CLINICAL IMPRESSION:  Pt. has made progress overall LUE functioning since the initial evaluation. Pt. has steadily  progressed with left shoulder ROM since having hurting her shoulder during a fall, and having had an injection. Pt. has reached a plateau at this time with left shoulder flexion, abduction, grip strength, pinch strength, and FMC skills. Pt. is now able to engage her LUE for hair care more efficiently, is able to more securely hold items in her right hand, and can manipulate small objects more efficiently. Pt. 's  pain has significantly improved, and Pt. Is independent with HEPs. Pt. Plans to Continue to work on typing when she can at home. Pt. Is now appropriate for discharge form OT services. Pt. Is in agreement.     PERFORMANCE DEFICITS in functional skills including ADLs, IADLs, coordination, dexterity, sensation, ROM, strength, FMC, decreased knowledge of use of DME, vision, and UE functional use, cognitive  skills including attention, and psychosocial skills including coping strategies, environmental adaptation, habits, and routines and behaviors.    IMPAIRMENTS are limiting patient from ADLs, IADLs, and social participation.    COMORBIDITIES may have co-morbidities  that affects occupational performance. Patient will benefit from skilled OT to address above impairments and improve overall function.   MODIFICATION OR ASSISTANCE TO COMPLETE EVALUATION: Min-Moderate modification of tasks or assist with assess necessary to complete an evaluation.   OT OCCUPATIONAL PROFILE AND HISTORY: Detailed assessment: Review of records and additional review of physical, cognitive, psychosocial history related to current functional performance.   CLINICAL DECISION MAKING: Moderate - several treatment options, min-mod task modification necessary   REHAB POTENTIAL: Good   EVALUATION COMPLEXITY: Moderate      PLAN: OT FREQUENCY: 1x/week   OT DURATION: 12 weeks   PLANNED INTERVENTIONS: self care/ADL training, therapeutic exercise, therapeutic activity, neuromuscular re-education, manual therapy, passive range of motion, and paraffin   RECOMMENDED OTHER SERVICES:    CONSULTED AND AGREED WITH PLAN OF CARE: Patient   PLAN FOR NEXT SESSION:  Left hand strengthening and coordination exercises  Duey Ghent, MS, OTR/L    11/17/23, 4:58 PM

## 2023-11-18 NOTE — Addendum Note (Signed)
 Addended by: Edra Govern D on: 11/18/2023 11:47 AM   Modules accepted: Orders

## 2023-11-18 NOTE — Progress Notes (Signed)
 [facility] Summary Report / Loop Recorder

## 2023-11-19 ENCOUNTER — Ambulatory Visit: Payer: Medicare HMO | Admitting: Occupational Therapy

## 2023-11-24 ENCOUNTER — Ambulatory Visit: Payer: Medicare HMO | Admitting: Occupational Therapy

## 2023-11-25 ENCOUNTER — Ambulatory Visit: Admitting: Occupational Therapy

## 2023-11-26 ENCOUNTER — Ambulatory Visit: Payer: Medicare HMO | Admitting: Occupational Therapy

## 2023-11-27 ENCOUNTER — Encounter: Admitting: Physical Medicine and Rehabilitation

## 2023-12-01 ENCOUNTER — Ambulatory Visit: Payer: Medicare HMO | Admitting: Occupational Therapy

## 2023-12-03 ENCOUNTER — Ambulatory Visit: Payer: Medicare HMO | Admitting: Occupational Therapy

## 2023-12-08 ENCOUNTER — Ambulatory Visit: Payer: Medicare HMO | Admitting: Occupational Therapy

## 2023-12-10 ENCOUNTER — Ambulatory Visit: Payer: Medicare HMO | Admitting: Occupational Therapy

## 2023-12-15 ENCOUNTER — Ambulatory Visit (INDEPENDENT_AMBULATORY_CARE_PROVIDER_SITE_OTHER): Payer: Medicare HMO

## 2023-12-15 ENCOUNTER — Ambulatory Visit: Payer: Medicare HMO | Admitting: Occupational Therapy

## 2023-12-15 DIAGNOSIS — I639 Cerebral infarction, unspecified: Secondary | ICD-10-CM

## 2023-12-15 LAB — CUP PACEART REMOTE DEVICE CHECK
Date Time Interrogation Session: 20250526231628
Implantable Pulse Generator Implant Date: 20230116

## 2023-12-16 ENCOUNTER — Ambulatory Visit: Payer: Self-pay | Admitting: Internal Medicine

## 2023-12-17 ENCOUNTER — Ambulatory Visit: Payer: Medicare HMO | Admitting: Occupational Therapy

## 2023-12-22 ENCOUNTER — Ambulatory Visit: Payer: Medicare HMO | Admitting: Occupational Therapy

## 2023-12-24 ENCOUNTER — Ambulatory Visit: Payer: Medicare HMO | Admitting: Occupational Therapy

## 2023-12-25 NOTE — Addendum Note (Signed)
 Addended by: Edra Govern D on: 12/25/2023 12:26 PM   Modules accepted: Orders

## 2023-12-25 NOTE — Progress Notes (Signed)
 [facility] Summary Report / Loop Recorder

## 2023-12-29 ENCOUNTER — Ambulatory Visit: Payer: Medicare HMO | Admitting: Occupational Therapy

## 2023-12-31 ENCOUNTER — Ambulatory Visit: Payer: Medicare HMO | Admitting: Occupational Therapy

## 2024-01-05 ENCOUNTER — Ambulatory Visit: Payer: Medicare HMO | Admitting: Occupational Therapy

## 2024-01-07 ENCOUNTER — Ambulatory Visit: Payer: Medicare HMO | Admitting: Occupational Therapy

## 2024-01-12 ENCOUNTER — Ambulatory Visit: Payer: Medicare HMO | Admitting: Occupational Therapy

## 2024-01-14 ENCOUNTER — Ambulatory Visit: Payer: Medicare HMO | Admitting: Occupational Therapy

## 2024-01-14 ENCOUNTER — Ambulatory Visit (INDEPENDENT_AMBULATORY_CARE_PROVIDER_SITE_OTHER)

## 2024-01-14 DIAGNOSIS — I639 Cerebral infarction, unspecified: Secondary | ICD-10-CM | POA: Diagnosis not present

## 2024-01-14 LAB — CUP PACEART REMOTE DEVICE CHECK
Date Time Interrogation Session: 20250625231551
Implantable Pulse Generator Implant Date: 20230116

## 2024-01-17 ENCOUNTER — Ambulatory Visit: Payer: Self-pay | Admitting: Internal Medicine

## 2024-01-19 ENCOUNTER — Ambulatory Visit: Payer: Medicare HMO | Admitting: Occupational Therapy

## 2024-01-21 ENCOUNTER — Ambulatory Visit: Payer: Medicare HMO | Admitting: Occupational Therapy

## 2024-01-26 ENCOUNTER — Ambulatory Visit: Payer: Medicare HMO | Admitting: Occupational Therapy

## 2024-01-27 ENCOUNTER — Telehealth: Payer: Self-pay | Admitting: Adult Health

## 2024-01-27 NOTE — Telephone Encounter (Signed)
 ..  Pt understands that although there may be some limitations with this type of visit, we will take all precautions to reduce any security or privacy concerns.  Pt understands that this will be treated like an in office visit and we will file with pt's insurance, and there may be a patient responsible charge related to this service. ? ?

## 2024-01-28 ENCOUNTER — Ambulatory Visit: Payer: Medicare HMO | Admitting: Occupational Therapy

## 2024-01-28 ENCOUNTER — Ambulatory Visit: Payer: Medicare HMO | Admitting: Adult Health

## 2024-02-01 NOTE — Progress Notes (Signed)
 [facility] Summary Report / Loop Recorder

## 2024-02-02 ENCOUNTER — Ambulatory Visit: Payer: Medicare HMO | Admitting: Occupational Therapy

## 2024-02-04 ENCOUNTER — Ambulatory Visit: Payer: Medicare HMO | Admitting: Occupational Therapy

## 2024-02-09 ENCOUNTER — Telehealth (INDEPENDENT_AMBULATORY_CARE_PROVIDER_SITE_OTHER): Admitting: Adult Health

## 2024-02-09 ENCOUNTER — Encounter: Payer: Self-pay | Admitting: Adult Health

## 2024-02-09 ENCOUNTER — Ambulatory Visit: Payer: Medicare HMO | Admitting: Occupational Therapy

## 2024-02-09 DIAGNOSIS — I69319 Unspecified symptoms and signs involving cognitive functions following cerebral infarction: Secondary | ICD-10-CM

## 2024-02-09 DIAGNOSIS — I63511 Cerebral infarction due to unspecified occlusion or stenosis of right middle cerebral artery: Secondary | ICD-10-CM | POA: Diagnosis not present

## 2024-02-09 MED ORDER — MEMANTINE HCL 10 MG PO TABS: 10.0000 mg | ORAL_TABLET | Freq: Two times a day (BID) | ORAL | 3 refills | Status: AC

## 2024-02-09 NOTE — Progress Notes (Signed)
 [facility] [address] [place]. [place] [address] [phone]       STROKE FOLLOW UP NOTE  [name] Date of Birth:  10/14/1945 Medical Record Number:  [id number]   Reason for Referral: stroke follow up  Virtual Visit via Video Note  I connected with Alyssa Mayo on 02/09/24 at  4:00 PM EDT by a video enabled telemedicine application and verified that I am speaking with the correct person using two identifiers.  Location: Patient: at home Provider: in office, [facility]   I discussed the limitations of evaluation and management by telemedicine and the availability of in person appointments. The patient expressed understanding and agreed to proceed.   SUBJECTIVE:    HPI:   Update 02/09/2024 JM: Patient returns for follow-up visit via MyChart video visit after prior visit almost 1 year ago.  Overall stable from neurological standpoint without new stroke/TIA symptoms.  Continued cognitive impairment poststroke which has been stable.  Remains on memantine  10 mg twice daily.  She continues to live with her son, able to maintain majority of ADLs independently and son assists with IADLs.  Reports compliance on Eliquis  and atorvastatin . Has not seen PCP recently but she plans on scheduling a follow up visit soon.  Follows with cardiology yearly.  Being followed by PMR for diabetic neuropathy with use of Qutenza  and gabapentin .  Completed OT back in April for left shoulder adhesive capsulitis in setting of rotator cuff tear with noted improvement, she plans on f/u with ortho as needed.  No questions or concerns at this time.     History provided for reference purposes only Update 02/26/2023 JM: Patient returns for follow-up visit accompanied by her son and grandson.  Continued left arm weakness poststroke but injured left shoulder post fall in 09/2022.  Evaluated by Ortho back in June for further evaluation, concern of rotator cuff injury and recommended  proceeding with MRI, was having issues with insurance but this has been resolved per patient, awaiting to be scheduled.  Continues working with OT.  Cognition has been overall stable, per patient has improved some, can have good days or bad days, use of Namenda  10 mg twice daily, tolerating well.  Remains on Eliquis  and atorvastatin  but per son, also on simvastatin . Routinely follows with PCP for stroke risk factor management, recent lab work satisfactory. Still not using CPAP, machine still not working correctly, has not yet contacted DME.  Update 08/25/2022 JM: Patient returns for 40-month stroke follow-up accompanied by her son.  Does have short-term memory loss since her stroke but son concerned this has been worsening especially over the past month. Can fluctuate day to day.  Was seen by PCP 1/29, completed lab work which was unremarkable. Was having difficulty sleeping but this has improved over the past 3 weeks. Does have OSA but unable to use CPAP over the past several weeks as machine not working correctly. Feels her appetite is good. Mainly watches TV during the day. Does not do any routine memory exercises, completed SLP about 1 month ago as met maximal rehab potential. Has been doing chair yoga for 20-30 minutes most nights. Continues working with PT/OT for residual left-sided deficits and gait impairment, does feel some improvement since prior visit. Denies new stroke/TIA symptoms.   ILR showed evidence of A-fib in 06/2022, was placed on Eliquis  and stop aspirin .  Remains on Eliquis  without side effects as well as atorvastatin . Blood pressure 139/73  Update 02/20/2022 JM: Patient returns for 75-month stroke  follow-up accompanied by her son.  Has been stable without new stroke/TIA symptoms.  Reports residual mild LUE weakness, occasional imbalance/gait unsteadiness but no recent falls, some slurred speech, and mild memory impairment with impaired attention/concentration - reports improvement since  prior visit Completed HH PT/OT last week.  Plans on participating in a local gym for continued exercises.  Does have driving simulation test scheduled Monday. Son is still staying with her. Able to maintain majority of ADLs independently but does need assistance for IADLs.  Compliant on aspirin  and atorvastatin , denies side effects Blood pressure today 148/60 - was being monitored by therapies and had been stable Loop recorder has not shown atrial fibrillation thus far Routinely monitored by PCP Dr. Valora - plans on repeat labs 03/2022  Does have sleep apnea, has CPAP machine but reports it has not been working.  She has not yet reached out to DME company.  No further concerns at this time.  Initial visit 10/16/2021 JM: Patient being seen for initial hospital follow-up accompanied by her son, [name].  Doing well since discharge reporting residual left arm weakness, imbalance, occasional slurred speech and mild short term memory impairment but overall improving. She also mentions persistent generalized cold sensation and fatigued quickly.  Currently ambulating with a cane, denies any recent falls.  Working with [facility] PT/OT/SLP. She is concerned that she may have injured her wrist when she fell prior to coming to ED, reports increased pain after working with OT. Currently using a wrist brace with some benefit.  Lives in own home, son currently living with her, does need assistance for ADLs and IADLs. Prior to stroke was completely independent as well as driving. She has not yet returned back to driving. Denies new stroke/TIA symptoms.  Compliant on aspirin  and atorvastatin , denies side effects.  Blood pressure today 138/85.  Routinely monitors at home with home health therapy and usually overall stable. Glucose levels monitored at home which has been stable. Loop recorder has not shown atrial fibrillation thus far.  She has since been seen by PCP and endocrinology. Repeat lab work on 2/8 showed LDL 78 and A1c  7.5.  No further concerns at this time.   Stroke admission 08/01/2021 [name] is a 78 y.o. female with history of diabetes, hypertension, hyperlipidemia, obesity, and sleep apnea who presented to [facility] ED on 08/01/2021 after being found down by her son. Her last know well was approximately 07/29/2021. She may have been suffering with some upper respiratory symptoms, took some cough medicine and has not been like herself since Tuesday (1/10).  She also had not taken her diabetes medications.  She was found to be mildly hyperglycemic with blood sugars in the upper 200s.  CTH showed infarct versus mass in right MCA territory.  She was transferred to [facility] ED for further evaluation.  MRI showed extensive restricted diffusion throughout much of the right MCA territory with associated changes of hemorrhagic transformation and localized cerebral edema and mass effect. Per Dr. Rosemarie, felt embolic pattern secondary to unclear source although concerning for occult A fib. 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  CTA head/neck showed right M2 thrombus.  EF 60 to 65%.  LDL 93.  A1c 7.4.  Recommended placement of ILR prior to discharge to evaluate for A-fib.  On aspirin  PTA, held during admission but restarted at discharge.  Initiated amlodipine  and lisinopril  for BP management.  Switched home dose simvastatin  to atorvastatin  20 mg  daily.  Resumed home diabetic medication regimen.  Other stroke risk factors include advanced age, obesity family history of stroke and OSA on CPAP.  Per therapy recommendations, discharged to CIR on 1/18 for ongoing therapy needs.     PERTINENT IMAGING  Per hospitalization 08/01/2021 Code Stroke- infarct versus mass in the right MCA territory. Repeat Head CT- Unchanged right MCA territory infarct with petechial hemorrhage. CTA head & neck- R M2 thrombus MRI- extensive restricted diffusion throughout much of the  right MCA territory with associated changes of hemorrhagic transformation and localized cerebral edema and mass-effect.. 2D Echo ejection fraction 60 to 65%. LDL 93 HgbA1c 7.4    ROS:   14 system review of systems performed and negative with exception of those listed in HPI  PMH:  Past Medical History:  Diagnosis Date   Abnormal levels of other serum enzymes 07/17/2015   Arthritis    Constipation 07/11/2015   COVID-19 03/06/2021   Diabetes mellitus without complication (HCC)    Elevated liver enzymes    Fatty liver    Generalized abdominal pain 07/11/2015   Hyperlipidemia    Hypertension    Lifelong obesity    Macular degeneration    Morbid obesity due to excess calories (HCC) 07/11/2015   Other fatigue 07/11/2015   Sleep apnea    Spinal headache     PSH:  Past Surgical History:  Procedure Laterality Date   ABDOMINAL HYSTERECTOMY     APPENDECTOMY     CATARACT EXTRACTION     CERVICAL LAMINECTOMY  07/21/1985   CESAREAN SECTION     COLONOSCOPY  07/21/2012   diverticulosis, [facility] Dr. Luellen    COLONOSCOPY WITH PROPOFOL  N/A 02/04/2019   Procedure: COLONOSCOPY WITH PROPOFOL ;  Surgeon: Gaylyn Gladis PENNER, MD;  Location: [facility] ENDOSCOPY;  Service: Endoscopy;  Laterality: N/A;   COLONOSCOPY WITH PROPOFOL  N/A 03/18/2021   Procedure: COLONOSCOPY WITH PROPOFOL ;  Surgeon: Maryruth Ole DASEN, MD;  Location: [facility] ENDOSCOPY;  Service: Endoscopy;  Laterality: N/A;  DM   DIAGNOSTIC LAPAROSCOPY     ESOPHAGOGASTRODUODENOSCOPY (EGD) WITH PROPOFOL  N/A 02/04/2019   Procedure: ESOPHAGOGASTRODUODENOSCOPY (EGD) WITH PROPOFOL ;  Surgeon: Gaylyn Gladis PENNER, MD;  Location: [facility] ENDOSCOPY;  Service: Endoscopy;  Laterality: N/A;   ESOPHAGOGASTRODUODENOSCOPY (EGD) WITH PROPOFOL  N/A 03/18/2021   Procedure: ESOPHAGOGASTRODUODENOSCOPY (EGD) WITH PROPOFOL ;  Surgeon: Maryruth Ole DASEN, MD;  Location: [facility] ENDOSCOPY;  Service: Endoscopy;  Laterality: N/A;   LOOP RECORDER INSERTION N/A 08/05/2021    Procedure: LOOP RECORDER INSERTION;  Surgeon: Waddell Danelle ORN, MD;  Location: [facility] INVASIVE CV LAB;  Service: Cardiovascular;  Laterality: N/A;   SPINE SURGERY     TUBAL LIGATION      Social History:  Social History   Socioeconomic History   Marital status: Widowed    Spouse name: Not on file   Number of children: Not on file   Years of education: Not on file   Highest education level: Bachelor's degree (e.g., BA, AB, BS)  Occupational History   Not on file  Tobacco Use   Smoking status: Never   Smokeless tobacco: Never   Tobacco comments:    Never smoke 08/08/22  Vaping Use   Vaping status: Never Used  Substance and Sexual Activity   Alcohol use: Not Currently    Comment: none last 24 months   Drug use: No   Sexual activity: Not on file  Other Topics Concern   Not on file  Social History Narrative   Not on file   Social Drivers of  Health   Financial Resource Strain: Low Risk  (12/17/2022)   Received from [facility]   Overall Financial Resource Strain (CARDIA)    Difficulty of Paying Living Expenses: Not hard at all  Food Insecurity: No Food Insecurity (12/17/2022)   Received from [facility]   Hunger Vital Sign    Within the past 12 months, you worried that your food would run out before you got the money to buy more.: Never true    Within the past 12 months, the food you bought just didn't last and you didn't have money to get more.: Never true  Transportation Needs: No Transportation Needs (12/17/2022)   Received from [facility] - Transportation    In the past 12 months, has lack of transportation kept you from medical appointments or from getting medications?: No    Lack of Transportation (Non-Medical): No  Physical Activity: Inactive (12/10/2017)   Exercise Vital Sign    Days of Exercise per Week: 0 days    Minutes of Exercise per Session: 0 min  Stress: No Stress Concern Present (12/10/2017)    [employer] of Occupational Health - Occupational Stress Questionnaire    Feeling of Stress : Not at all  Social Connections: Socially Integrated (12/10/2017)   Social Connection and Isolation Panel    Frequency of Communication with Friends and Family: More than three times a week    Frequency of Social Gatherings with Friends and Family: More than three times a week    Attends Religious Services: More than 4 times per year    Active Member of [employer] or Organizations: Yes    Attends Engineer, structural: More than 4 times per year    Marital Status: Married  Catering manager Violence: Not At Risk (09/01/2022)   Humiliation, Afraid, Rape, and Kick questionnaire    Fear of Current or Ex-Partner: No    Emotionally Abused: No    Physically Abused: No    Sexually Abused: No    Family History:  Family History  Problem Relation Age of Onset   Cancer Mother        breast   Heart disease Mother    Stroke Mother    Diabetes Mother    Colon cancer Mother    Cancer Father        leukemia   Stroke Father    Leukemia Father    Ovarian cancer Sister    Diabetes Brother    Cancer Brother    Stroke Maternal Grandfather    Dementia Paternal Grandmother    COPD Neg Hx    Hypertension Neg Hx     Medications:   Current Outpatient Medications on File Prior to Visit  Medication Sig Dispense Refill   acetaminophen  (TYLENOL ) 325 MG tablet Take 2 tablets (650 mg total) by mouth every 4 (four) hours as needed for mild pain (or temp > 37.5 C (99.5 F)). (Patient taking differently: Take 650 mg by mouth as needed for mild pain (pain score 1-3) (or temp > 37.5 C (99.5 F)).)     amLODipine  (NORVASC ) 2.5 MG tablet Take 1 tablet (2.5 mg total) by mouth daily. 30 tablet 0   apixaban  (ELIQUIS ) 5 MG TABS tablet Take 1 tablet (5 mg total) by mouth 2 (two) times daily. 60 tablet 9   atorvastatin  (LIPITOR) 20 MG tablet Take 20 mg by mouth daily.     dapagliflozin  propanediol (FARXIGA ) 10 MG TABS  tablet  Take 10 mg by mouth daily. 90 tablet 1   glipiZIDE  (GLUCOTROL  XL) 5 MG 24 hr tablet Take 1 tablet (5 mg total) by mouth 2 (two) times daily with a meal. (Patient taking differently: Take 2.5 mg by mouth 2 (two) times daily with a meal. 5 mg in am and 2.5 mg in the pm) 60 tablet 0   magnesium  oxide (MAG-OX) 400 MG tablet Take 1 tablet (400 mg total) by mouth at bedtime. 30 tablet 0   memantine  (NAMENDA ) 10 MG tablet Take 1 tablet (10 mg total) by mouth 2 (two) times daily. 180 tablet 3   metFORMIN  (GLUCOPHAGE ) 500 MG tablet Take 2 tablets (1,000 mg total) by mouth 2 (two) times daily. 120 tablet 0   Multiple Vitamins-Minerals (PRESERVISION AREDS 2) CAPS Take 1 capsule by mouth 2 (two) times daily.     pantoprazole  (PROTONIX ) 40 MG tablet Take 1 tablet (40 mg total) by mouth daily. 30 tablet 0   Current Facility-Administered Medications on File Prior to Visit  Medication Dose Route Frequency Provider Last Rate Last Admin   capsaicin  topical system 8 % patch 4 patch  4 patch Topical Once Raulkar, Sven SQUIBB, MD       capsaicin  topical system 8 % patch 4 patch  4 patch Topical Once         Allergies:   Allergies  Allergen Reactions   Codeine Other (See Comments)    Makes her very loopy and groggy   Neosporin [Neomycin-Bacitracin Zn-Polymyx] Hives   Ozempic  [Semaglutide ] Hives   Polymyxin B  Other (See Comments)    Eyes redness   Sulfa Antibiotics Itching      OBJECTIVE:  Physical Exam  General: well developed, well nourished, pleasant elderly Caucasian female, seated, in no evident distress  Neurologic Exam Mental Status: Awake and fully alert. mild dysarthria.  No evidence of aphasia. Oriented to place and time. Recent memory mildly impaired and remote memory intact. Attention span, concentration and fund of knowledge appropriate during visit. Mood and affect appropriate.         ASSESSMENT: Alyssa Mayo is a 78 y.o. 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 Vascular risk factors include new dx of A fib, HTN, HLD, DM, advanced age, obesity and OSA.     PLAN:  Mild cognitive impairment: Suspect multifactorial in setting of sedentary lifestyle without routine memory exercises or physical activity, lack of CPAP use and hx of stroke.  Subjectively, stable since prior visit MMSE previously 29/30 - unable to repeat today d/t visit type Continue Namenda  10 mg twice daily -refill provided but request ongoing refills by PCP Discussed importance of routine memory exercises and physical activity.  Again, advised to reach out to DME company to discuss CPAP concerns, discussed importance of sleep apnea management and risks of untreated apnea  R MCA stroke with HT :  Residual deficit: LUE weakness, gait impairment, mild dysarthria and mild short-term memory loss.   Loop recorder showed A fib 06/2022 Continue Eliquis  5mg  BID and atorvastatin  20 mg daily for secondary stroke prevention managed/prescribed by PCP Continue routine f/u with cardiology for  A fib and eliqis management  Discussed secondary stroke prevention measures and importance of close PCP follow up for aggressive stroke risk factor management including BP goal<130/90, HLD with LDL goal<70 and DM with A1c.<7.  Stroke labs 11/2022:  LDL 51, A1c 5.3 - advised to schedule f/u with PCP for repeat/updated labs I have gone over the pathophysiology of stroke, warning signs and symptoms, risk factors and their management in some detail with instructions to go to the closest emergency room for symptoms of concern.    No further recommendations from neurological standpoint and can return back to PCP for ongoing monitoring and management at this time.  She was advised to call with any questions or concerns in the future.     CC:  PCP:  Valora Agent, MD    I personally spent a total of 25 minutes in the care of the patient today including preparing to see the patient, counseling and educating, placing orders, and documenting clinical information in the EHR.    Harlene Bogaert, AGNP-BC  [facility] [address] [place], [place] [address]  Phone [phone] Fax [phone] Note: This document was prepared with digital dictation and possible smart phrase technology. Any transcriptional errors that result from this process are unintentional.

## 2024-02-11 ENCOUNTER — Ambulatory Visit: Payer: Medicare HMO | Admitting: Occupational Therapy

## 2024-02-15 ENCOUNTER — Ambulatory Visit

## 2024-02-15 DIAGNOSIS — I639 Cerebral infarction, unspecified: Secondary | ICD-10-CM

## 2024-02-16 ENCOUNTER — Encounter: Admitting: Occupational Therapy

## 2024-02-16 LAB — CUP PACEART REMOTE DEVICE CHECK
Date Time Interrogation Session: 20250727231727
Implantable Pulse Generator Implant Date: 20230116

## 2024-02-19 ENCOUNTER — Ambulatory Visit: Payer: Self-pay | Admitting: Internal Medicine

## 2024-02-23 ENCOUNTER — Encounter: Admitting: Occupational Therapy

## 2024-03-01 ENCOUNTER — Encounter: Admitting: Occupational Therapy

## 2024-03-08 ENCOUNTER — Encounter: Admitting: Occupational Therapy

## 2024-03-11 ENCOUNTER — Other Ambulatory Visit: Payer: Self-pay | Admitting: Physical Medicine and Rehabilitation

## 2024-03-15 ENCOUNTER — Encounter: Admitting: Occupational Therapy

## 2024-03-16 ENCOUNTER — Other Ambulatory Visit: Payer: Self-pay | Admitting: Physical Medicine and Rehabilitation

## 2024-03-17 ENCOUNTER — Ambulatory Visit

## 2024-03-17 ENCOUNTER — Other Ambulatory Visit: Payer: Self-pay | Admitting: Physical Medicine and Rehabilitation

## 2024-03-17 DIAGNOSIS — I639 Cerebral infarction, unspecified: Secondary | ICD-10-CM

## 2024-03-17 LAB — CUP PACEART REMOTE DEVICE CHECK
Date Time Interrogation Session: 20250827231601
Implantable Pulse Generator Implant Date: 20230116

## 2024-03-18 ENCOUNTER — Telehealth: Payer: Self-pay

## 2024-03-18 NOTE — Telephone Encounter (Signed)
 Refill request for Gabapentin  100 mg # 90 take one capsule at bedtime.

## 2024-03-21 ENCOUNTER — Ambulatory Visit: Payer: Self-pay | Admitting: Internal Medicine

## 2024-03-22 ENCOUNTER — Encounter: Admitting: Occupational Therapy

## 2024-03-29 ENCOUNTER — Encounter: Admitting: Occupational Therapy

## 2024-03-29 NOTE — Progress Notes (Signed)
 [facility] Summary Report / Loop Recorder

## 2024-03-31 NOTE — Progress Notes (Signed)
 Remote Loop Recorder Transmission

## 2024-04-05 ENCOUNTER — Encounter: Admitting: Occupational Therapy

## 2024-04-12 ENCOUNTER — Encounter: Admitting: Occupational Therapy

## 2024-04-18 ENCOUNTER — Ambulatory Visit (INDEPENDENT_AMBULATORY_CARE_PROVIDER_SITE_OTHER)

## 2024-04-18 DIAGNOSIS — I639 Cerebral infarction, unspecified: Secondary | ICD-10-CM

## 2024-04-18 LAB — CUP PACEART REMOTE DEVICE CHECK
Date Time Interrogation Session: 20250928231910
Implantable Pulse Generator Implant Date: 20230116

## 2024-04-19 ENCOUNTER — Encounter: Admitting: Occupational Therapy

## 2024-04-19 ENCOUNTER — Ambulatory Visit: Payer: Self-pay | Admitting: Internal Medicine

## 2024-04-20 NOTE — Progress Notes (Signed)
 Remote Loop Recorder Transmission

## 2024-04-26 ENCOUNTER — Encounter: Admitting: Occupational Therapy

## 2024-05-03 ENCOUNTER — Encounter: Admitting: Occupational Therapy

## 2024-05-10 ENCOUNTER — Encounter: Admitting: Occupational Therapy

## 2024-05-19 ENCOUNTER — Ambulatory Visit (INDEPENDENT_AMBULATORY_CARE_PROVIDER_SITE_OTHER)

## 2024-05-19 DIAGNOSIS — I639 Cerebral infarction, unspecified: Secondary | ICD-10-CM | POA: Diagnosis not present

## 2024-05-19 LAB — CUP PACEART REMOTE DEVICE CHECK
Date Time Interrogation Session: 20251029231730
Implantable Pulse Generator Implant Date: 20230116

## 2024-05-25 NOTE — Progress Notes (Signed)
 Remote Loop Recorder Transmission

## 2024-05-26 ENCOUNTER — Ambulatory Visit: Payer: Self-pay | Admitting: Internal Medicine

## 2024-06-19 ENCOUNTER — Ambulatory Visit: Attending: Internal Medicine

## 2024-06-19 DIAGNOSIS — I639 Cerebral infarction, unspecified: Secondary | ICD-10-CM

## 2024-06-20 ENCOUNTER — Encounter

## 2024-06-20 LAB — CUP PACEART REMOTE DEVICE CHECK
Date Time Interrogation Session: 20251129231906
Implantable Pulse Generator Implant Date: 20230116

## 2024-06-23 ENCOUNTER — Ambulatory Visit: Payer: Self-pay | Admitting: Internal Medicine

## 2024-06-23 NOTE — Progress Notes (Signed)
 Remote Loop Recorder Transmission

## 2024-06-27 NOTE — Telephone Encounter (Signed)
 Not on med list

## 2024-07-20 ENCOUNTER — Ambulatory Visit

## 2024-07-20 DIAGNOSIS — I639 Cerebral infarction, unspecified: Secondary | ICD-10-CM

## 2024-07-21 ENCOUNTER — Encounter

## 2024-07-21 LAB — CUP PACEART REMOTE DEVICE CHECK
Date Time Interrogation Session: 20251230230939
Implantable Pulse Generator Implant Date: 20230116

## 2024-07-24 ENCOUNTER — Ambulatory Visit: Payer: Self-pay | Admitting: Cardiology

## 2024-07-25 NOTE — Progress Notes (Signed)
 Remote Loop Recorder Transmission

## 2024-08-20 ENCOUNTER — Ambulatory Visit: Attending: Cardiology

## 2024-08-20 DIAGNOSIS — I639 Cerebral infarction, unspecified: Secondary | ICD-10-CM

## 2024-08-22 LAB — CUP PACEART REMOTE DEVICE CHECK
Date Time Interrogation Session: 20260130231033
Implantable Pulse Generator Implant Date: 20230116

## 2024-08-26 NOTE — Progress Notes (Signed)
 Remote Loop Recorder Transmission

## 2024-09-20 ENCOUNTER — Ambulatory Visit

## 2024-10-21 ENCOUNTER — Ambulatory Visit

## 2024-11-21 ENCOUNTER — Ambulatory Visit

## 2024-12-22 ENCOUNTER — Ambulatory Visit

## 2025-01-22 ENCOUNTER — Ambulatory Visit
